# Patient Record
Sex: Male | Born: 1940 | Race: White | Hispanic: No | Smoking: Former smoker
Health system: Southern US, Community
[De-identification: ages and names within clinical notes are randomized; demographics above are authoritative.]

## PROBLEM LIST (undated history)

## (undated) DIAGNOSIS — K760 Fatty (change of) liver, not elsewhere classified: Secondary | ICD-10-CM

## (undated) DIAGNOSIS — K226 Gastro-esophageal laceration-hemorrhage syndrome: Secondary | ICD-10-CM

## (undated) DIAGNOSIS — F329 Major depressive disorder, single episode, unspecified: Secondary | ICD-10-CM

## (undated) DIAGNOSIS — I959 Hypotension, unspecified: Secondary | ICD-10-CM

## (undated) DIAGNOSIS — W19XXXA Unspecified fall, initial encounter: Secondary | ICD-10-CM

## (undated) DIAGNOSIS — M1990 Unspecified osteoarthritis, unspecified site: Secondary | ICD-10-CM

## (undated) DIAGNOSIS — K8020 Calculus of gallbladder without cholecystitis without obstruction: Secondary | ICD-10-CM

## (undated) DIAGNOSIS — Z86010 Personal history of colonic polyps: Secondary | ICD-10-CM

## (undated) DIAGNOSIS — N186 End stage renal disease: Secondary | ICD-10-CM

## (undated) DIAGNOSIS — I495 Sick sinus syndrome: Secondary | ICD-10-CM

## (undated) DIAGNOSIS — Y92009 Unspecified place in unspecified non-institutional (private) residence as the place of occurrence of the external cause: Secondary | ICD-10-CM

## (undated) DIAGNOSIS — Z9289 Personal history of other medical treatment: Secondary | ICD-10-CM

## (undated) DIAGNOSIS — G4733 Obstructive sleep apnea (adult) (pediatric): Secondary | ICD-10-CM

## (undated) DIAGNOSIS — Z889 Allergy status to unspecified drugs, medicaments and biological substances status: Secondary | ICD-10-CM

## (undated) DIAGNOSIS — F1020 Alcohol dependence, uncomplicated: Secondary | ICD-10-CM

## (undated) DIAGNOSIS — I10 Essential (primary) hypertension: Secondary | ICD-10-CM

## (undated) DIAGNOSIS — R55 Syncope and collapse: Secondary | ICD-10-CM

## (undated) DIAGNOSIS — I5032 Chronic diastolic (congestive) heart failure: Secondary | ICD-10-CM

## (undated) DIAGNOSIS — D649 Anemia, unspecified: Secondary | ICD-10-CM

## (undated) DIAGNOSIS — K219 Gastro-esophageal reflux disease without esophagitis: Secondary | ICD-10-CM

## (undated) DIAGNOSIS — Z8719 Personal history of other diseases of the digestive system: Secondary | ICD-10-CM

## (undated) DIAGNOSIS — F419 Anxiety disorder, unspecified: Secondary | ICD-10-CM

## (undated) DIAGNOSIS — G4700 Insomnia, unspecified: Secondary | ICD-10-CM

## (undated) DIAGNOSIS — I462 Cardiac arrest due to underlying cardiac condition: Secondary | ICD-10-CM

## (undated) DIAGNOSIS — R001 Bradycardia, unspecified: Secondary | ICD-10-CM

## (undated) DIAGNOSIS — I499 Cardiac arrhythmia, unspecified: Secondary | ICD-10-CM

## (undated) DIAGNOSIS — E785 Hyperlipidemia, unspecified: Secondary | ICD-10-CM

## (undated) DIAGNOSIS — K2970 Gastritis, unspecified, without bleeding: Secondary | ICD-10-CM

## (undated) DIAGNOSIS — G2581 Restless legs syndrome: Secondary | ICD-10-CM

## (undated) DIAGNOSIS — I4891 Unspecified atrial fibrillation: Secondary | ICD-10-CM

## (undated) DIAGNOSIS — I77810 Thoracic aortic ectasia: Secondary | ICD-10-CM

## (undated) HISTORY — DX: Alcohol dependence, uncomplicated: F10.20

## (undated) HISTORY — DX: Sick sinus syndrome: I49.5

## (undated) HISTORY — DX: Thoracic aortic ectasia: I77.810

## (undated) HISTORY — DX: Insomnia, unspecified: G47.00

## (undated) HISTORY — DX: Gastro-esophageal reflux disease without esophagitis: K21.9

## (undated) HISTORY — DX: Hypotension, unspecified: I95.9

## (undated) HISTORY — PX: JOINT REPLACEMENT: SHX530

## (undated) HISTORY — PX: COLONOSCOPY: SHX174

## (undated) HISTORY — PX: TONSILLECTOMY: SUR1361

## (undated) HISTORY — DX: Syncope and collapse: R55

## (undated) HISTORY — DX: Hyperlipidemia, unspecified: E78.5

## (undated) HISTORY — DX: Major depressive disorder, single episode, unspecified: F32.9

## (undated) HISTORY — DX: Chronic diastolic (congestive) heart failure: I50.32

## (undated) HISTORY — DX: Unspecified osteoarthritis, unspecified site: M19.90

## (undated) HISTORY — PX: VASCULAR SURGERY: SHX849

## (undated) HISTORY — DX: Cardiac arrest due to underlying cardiac condition: I46.2

## (undated) HISTORY — DX: Gastro-esophageal laceration-hemorrhage syndrome: K22.6

## (undated) HISTORY — DX: Personal history of colonic polyps: Z86.010

## (undated) HISTORY — DX: Allergy status to unspecified drugs, medicaments and biological substances: Z88.9

## (undated) HISTORY — DX: Gastritis, unspecified, without bleeding: K29.70

## (undated) HISTORY — DX: Essential (primary) hypertension: I10

## (undated) HISTORY — PX: FRACTURE SURGERY: SHX138

---
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
## 2012-05-09 HISTORY — PX: ANKLE SURGERY: SHX546

## 2014-05-14 DIAGNOSIS — F329 Major depressive disorder, single episode, unspecified: Secondary | ICD-10-CM | POA: Diagnosis not present

## 2014-05-14 DIAGNOSIS — I10 Essential (primary) hypertension: Secondary | ICD-10-CM | POA: Diagnosis not present

## 2014-05-14 DIAGNOSIS — G4700 Insomnia, unspecified: Secondary | ICD-10-CM | POA: Diagnosis not present

## 2014-05-25 DIAGNOSIS — S3690XA Unspecified injury of unspecified intra-abdominal organ, initial encounter: Secondary | ICD-10-CM | POA: Diagnosis not present

## 2014-05-25 DIAGNOSIS — S0990XA Unspecified injury of head, initial encounter: Secondary | ICD-10-CM | POA: Diagnosis not present

## 2014-05-25 DIAGNOSIS — S52502D Unspecified fracture of the lower end of left radius, subsequent encounter for closed fracture with routine healing: Secondary | ICD-10-CM | POA: Diagnosis not present

## 2014-05-26 DIAGNOSIS — S52502D Unspecified fracture of the lower end of left radius, subsequent encounter for closed fracture with routine healing: Secondary | ICD-10-CM | POA: Diagnosis not present

## 2014-05-26 DIAGNOSIS — K760 Fatty (change of) liver, not elsewhere classified: Secondary | ICD-10-CM | POA: Diagnosis not present

## 2014-05-26 DIAGNOSIS — S82842A Displaced bimalleolar fracture of left lower leg, initial encounter for closed fracture: Secondary | ICD-10-CM | POA: Diagnosis not present

## 2014-05-29 DIAGNOSIS — M25559 Pain in unspecified hip: Secondary | ICD-10-CM | POA: Diagnosis not present

## 2014-05-30 DIAGNOSIS — I10 Essential (primary) hypertension: Secondary | ICD-10-CM | POA: Diagnosis not present

## 2014-05-30 DIAGNOSIS — S82841E Displaced bimalleolar fracture of right lower leg, subsequent encounter for open fracture type I or II with routine healing: Secondary | ICD-10-CM | POA: Diagnosis not present

## 2014-05-30 DIAGNOSIS — M25572 Pain in left ankle and joints of left foot: Secondary | ICD-10-CM | POA: Diagnosis not present

## 2014-06-02 DIAGNOSIS — E871 Hypo-osmolality and hyponatremia: Secondary | ICD-10-CM | POA: Diagnosis not present

## 2014-06-04 DIAGNOSIS — Z01818 Encounter for other preprocedural examination: Secondary | ICD-10-CM | POA: Diagnosis not present

## 2014-06-04 DIAGNOSIS — S82841E Displaced bimalleolar fracture of right lower leg, subsequent encounter for open fracture type I or II with routine healing: Secondary | ICD-10-CM | POA: Diagnosis not present

## 2014-06-04 DIAGNOSIS — M25572 Pain in left ankle and joints of left foot: Secondary | ICD-10-CM | POA: Diagnosis not present

## 2014-06-04 DIAGNOSIS — I10 Essential (primary) hypertension: Secondary | ICD-10-CM | POA: Diagnosis not present

## 2014-06-05 DIAGNOSIS — R9431 Abnormal electrocardiogram [ECG] [EKG]: Secondary | ICD-10-CM | POA: Diagnosis not present

## 2014-06-05 DIAGNOSIS — S82892A Other fracture of left lower leg, initial encounter for closed fracture: Secondary | ICD-10-CM | POA: Diagnosis not present

## 2014-06-09 DIAGNOSIS — K219 Gastro-esophageal reflux disease without esophagitis: Secondary | ICD-10-CM | POA: Diagnosis not present

## 2014-06-09 DIAGNOSIS — S93432A Sprain of tibiofibular ligament of left ankle, initial encounter: Secondary | ICD-10-CM | POA: Diagnosis not present

## 2014-06-09 DIAGNOSIS — S82852A Displaced trimalleolar fracture of left lower leg, initial encounter for closed fracture: Secondary | ICD-10-CM | POA: Diagnosis not present

## 2014-06-09 DIAGNOSIS — S8262XA Displaced fracture of lateral malleolus of left fibula, initial encounter for closed fracture: Secondary | ICD-10-CM | POA: Diagnosis not present

## 2014-06-09 DIAGNOSIS — I10 Essential (primary) hypertension: Secondary | ICD-10-CM | POA: Diagnosis not present

## 2014-06-11 DIAGNOSIS — I10 Essential (primary) hypertension: Secondary | ICD-10-CM | POA: Diagnosis not present

## 2014-06-11 DIAGNOSIS — S82892E Other fracture of left lower leg, subsequent encounter for open fracture type I or II with routine healing: Secondary | ICD-10-CM | POA: Diagnosis not present

## 2014-06-13 DIAGNOSIS — M25572 Pain in left ankle and joints of left foot: Secondary | ICD-10-CM | POA: Diagnosis not present

## 2014-06-18 DIAGNOSIS — M25572 Pain in left ankle and joints of left foot: Secondary | ICD-10-CM | POA: Diagnosis not present

## 2014-06-18 DIAGNOSIS — S82842A Displaced bimalleolar fracture of left lower leg, initial encounter for closed fracture: Secondary | ICD-10-CM | POA: Diagnosis not present

## 2014-06-18 DIAGNOSIS — S82842D Displaced bimalleolar fracture of left lower leg, subsequent encounter for closed fracture with routine healing: Secondary | ICD-10-CM | POA: Diagnosis not present

## 2014-06-19 DIAGNOSIS — L03116 Cellulitis of left lower limb: Secondary | ICD-10-CM | POA: Diagnosis not present

## 2014-06-25 DIAGNOSIS — R269 Unspecified abnormalities of gait and mobility: Secondary | ICD-10-CM | POA: Diagnosis not present

## 2014-06-25 DIAGNOSIS — Z5189 Encounter for other specified aftercare: Secondary | ICD-10-CM | POA: Diagnosis not present

## 2014-06-25 DIAGNOSIS — Z4789 Encounter for other orthopedic aftercare: Secondary | ICD-10-CM | POA: Diagnosis not present

## 2014-06-25 DIAGNOSIS — I10 Essential (primary) hypertension: Secondary | ICD-10-CM | POA: Diagnosis not present

## 2014-06-25 DIAGNOSIS — S82842D Displaced bimalleolar fracture of left lower leg, subsequent encounter for closed fracture with routine healing: Secondary | ICD-10-CM | POA: Diagnosis not present

## 2014-06-25 DIAGNOSIS — S82842G Displaced bimalleolar fracture of left lower leg, subsequent encounter for closed fracture with delayed healing: Secondary | ICD-10-CM | POA: Diagnosis not present

## 2014-06-25 DIAGNOSIS — R296 Repeated falls: Secondary | ICD-10-CM | POA: Diagnosis not present

## 2014-06-25 DIAGNOSIS — M6281 Muscle weakness (generalized): Secondary | ICD-10-CM | POA: Diagnosis not present

## 2014-06-25 DIAGNOSIS — R279 Unspecified lack of coordination: Secondary | ICD-10-CM | POA: Diagnosis not present

## 2014-06-25 DIAGNOSIS — M25572 Pain in left ankle and joints of left foot: Secondary | ICD-10-CM | POA: Diagnosis not present

## 2014-06-25 DIAGNOSIS — R7989 Other specified abnormal findings of blood chemistry: Secondary | ICD-10-CM | POA: Diagnosis not present

## 2014-06-25 DIAGNOSIS — S82892S Other fracture of left lower leg, sequela: Secondary | ICD-10-CM | POA: Diagnosis not present

## 2014-06-25 DIAGNOSIS — R2689 Other abnormalities of gait and mobility: Secondary | ICD-10-CM | POA: Diagnosis not present

## 2014-06-25 DIAGNOSIS — N19 Unspecified kidney failure: Secondary | ICD-10-CM | POA: Diagnosis not present

## 2014-06-27 DIAGNOSIS — N19 Unspecified kidney failure: Secondary | ICD-10-CM | POA: Diagnosis not present

## 2014-07-04 DIAGNOSIS — M25572 Pain in left ankle and joints of left foot: Secondary | ICD-10-CM | POA: Diagnosis not present

## 2014-07-04 DIAGNOSIS — Z4789 Encounter for other orthopedic aftercare: Secondary | ICD-10-CM | POA: Diagnosis not present

## 2014-07-04 DIAGNOSIS — S82842D Displaced bimalleolar fracture of left lower leg, subsequent encounter for closed fracture with routine healing: Secondary | ICD-10-CM | POA: Diagnosis not present

## 2014-07-25 DIAGNOSIS — M25572 Pain in left ankle and joints of left foot: Secondary | ICD-10-CM | POA: Diagnosis not present

## 2014-08-08 DIAGNOSIS — M25572 Pain in left ankle and joints of left foot: Secondary | ICD-10-CM | POA: Diagnosis not present

## 2014-08-29 DIAGNOSIS — S82892D Other fracture of left lower leg, subsequent encounter for closed fracture with routine healing: Secondary | ICD-10-CM | POA: Diagnosis not present

## 2014-08-29 DIAGNOSIS — M25572 Pain in left ankle and joints of left foot: Secondary | ICD-10-CM | POA: Diagnosis not present

## 2014-09-04 DIAGNOSIS — K700 Alcoholic fatty liver: Secondary | ICD-10-CM | POA: Diagnosis not present

## 2014-09-04 DIAGNOSIS — I10 Essential (primary) hypertension: Secondary | ICD-10-CM | POA: Diagnosis not present

## 2014-09-04 DIAGNOSIS — G4730 Sleep apnea, unspecified: Secondary | ICD-10-CM | POA: Diagnosis not present

## 2014-09-04 DIAGNOSIS — E785 Hyperlipidemia, unspecified: Secondary | ICD-10-CM | POA: Diagnosis not present

## 2014-09-08 DIAGNOSIS — M25672 Stiffness of left ankle, not elsewhere classified: Secondary | ICD-10-CM | POA: Diagnosis not present

## 2014-09-08 DIAGNOSIS — R269 Unspecified abnormalities of gait and mobility: Secondary | ICD-10-CM | POA: Diagnosis not present

## 2014-09-08 DIAGNOSIS — M62572 Muscle wasting and atrophy, not elsewhere classified, left ankle and foot: Secondary | ICD-10-CM | POA: Diagnosis not present

## 2014-09-08 DIAGNOSIS — M25472 Effusion, left ankle: Secondary | ICD-10-CM | POA: Diagnosis not present

## 2014-09-10 DIAGNOSIS — M25472 Effusion, left ankle: Secondary | ICD-10-CM | POA: Diagnosis not present

## 2014-09-10 DIAGNOSIS — M25672 Stiffness of left ankle, not elsewhere classified: Secondary | ICD-10-CM | POA: Diagnosis not present

## 2014-09-10 DIAGNOSIS — M62572 Muscle wasting and atrophy, not elsewhere classified, left ankle and foot: Secondary | ICD-10-CM | POA: Diagnosis not present

## 2014-09-10 DIAGNOSIS — R269 Unspecified abnormalities of gait and mobility: Secondary | ICD-10-CM | POA: Diagnosis not present

## 2014-09-15 DIAGNOSIS — M62572 Muscle wasting and atrophy, not elsewhere classified, left ankle and foot: Secondary | ICD-10-CM | POA: Diagnosis not present

## 2014-09-15 DIAGNOSIS — R269 Unspecified abnormalities of gait and mobility: Secondary | ICD-10-CM | POA: Diagnosis not present

## 2014-09-15 DIAGNOSIS — M25672 Stiffness of left ankle, not elsewhere classified: Secondary | ICD-10-CM | POA: Diagnosis not present

## 2014-09-15 DIAGNOSIS — M25472 Effusion, left ankle: Secondary | ICD-10-CM | POA: Diagnosis not present

## 2014-10-03 DIAGNOSIS — M25572 Pain in left ankle and joints of left foot: Secondary | ICD-10-CM | POA: Diagnosis not present

## 2014-10-03 DIAGNOSIS — S82892D Other fracture of left lower leg, subsequent encounter for closed fracture with routine healing: Secondary | ICD-10-CM | POA: Diagnosis not present

## 2014-10-13 DIAGNOSIS — H40013 Open angle with borderline findings, low risk, bilateral: Secondary | ICD-10-CM | POA: Diagnosis not present

## 2014-11-04 DIAGNOSIS — R000 Tachycardia, unspecified: Secondary | ICD-10-CM | POA: Diagnosis not present

## 2014-11-04 DIAGNOSIS — N179 Acute kidney failure, unspecified: Secondary | ICD-10-CM | POA: Diagnosis not present

## 2014-11-04 DIAGNOSIS — S0191XA Laceration without foreign body of unspecified part of head, initial encounter: Secondary | ICD-10-CM | POA: Diagnosis not present

## 2014-11-04 DIAGNOSIS — I4891 Unspecified atrial fibrillation: Secondary | ICD-10-CM | POA: Diagnosis not present

## 2014-11-04 DIAGNOSIS — R6889 Other general symptoms and signs: Secondary | ICD-10-CM | POA: Diagnosis not present

## 2014-11-04 DIAGNOSIS — R55 Syncope and collapse: Secondary | ICD-10-CM | POA: Diagnosis not present

## 2014-11-04 DIAGNOSIS — Y929 Unspecified place or not applicable: Secondary | ICD-10-CM | POA: Diagnosis not present

## 2014-11-04 DIAGNOSIS — E871 Hypo-osmolality and hyponatremia: Secondary | ICD-10-CM | POA: Diagnosis not present

## 2014-11-04 DIAGNOSIS — R6520 Severe sepsis without septic shock: Secondary | ICD-10-CM | POA: Diagnosis not present

## 2014-11-04 DIAGNOSIS — R6521 Severe sepsis with septic shock: Secondary | ICD-10-CM | POA: Diagnosis not present

## 2014-11-04 DIAGNOSIS — W19XXXA Unspecified fall, initial encounter: Secondary | ICD-10-CM | POA: Diagnosis not present

## 2014-11-04 DIAGNOSIS — A419 Sepsis, unspecified organism: Secondary | ICD-10-CM | POA: Diagnosis not present

## 2014-11-04 DIAGNOSIS — E876 Hypokalemia: Secondary | ICD-10-CM | POA: Diagnosis not present

## 2014-11-05 DIAGNOSIS — I4891 Unspecified atrial fibrillation: Secondary | ICD-10-CM | POA: Diagnosis not present

## 2014-11-08 DIAGNOSIS — I4891 Unspecified atrial fibrillation: Secondary | ICD-10-CM | POA: Diagnosis not present

## 2014-11-09 DIAGNOSIS — I4891 Unspecified atrial fibrillation: Secondary | ICD-10-CM | POA: Diagnosis not present

## 2014-11-10 DIAGNOSIS — I4891 Unspecified atrial fibrillation: Secondary | ICD-10-CM | POA: Diagnosis not present

## 2014-11-11 DIAGNOSIS — I4891 Unspecified atrial fibrillation: Secondary | ICD-10-CM | POA: Diagnosis not present

## 2014-11-14 DIAGNOSIS — I951 Orthostatic hypotension: Secondary | ICD-10-CM | POA: Diagnosis not present

## 2014-11-14 DIAGNOSIS — I4891 Unspecified atrial fibrillation: Secondary | ICD-10-CM | POA: Diagnosis not present

## 2014-11-28 DIAGNOSIS — I10 Essential (primary) hypertension: Secondary | ICD-10-CM | POA: Diagnosis not present

## 2014-11-28 DIAGNOSIS — D649 Anemia, unspecified: Secondary | ICD-10-CM | POA: Diagnosis not present

## 2014-11-28 DIAGNOSIS — I4891 Unspecified atrial fibrillation: Secondary | ICD-10-CM | POA: Diagnosis not present

## 2014-12-05 DIAGNOSIS — Z125 Encounter for screening for malignant neoplasm of prostate: Secondary | ICD-10-CM | POA: Diagnosis not present

## 2014-12-05 DIAGNOSIS — N401 Enlarged prostate with lower urinary tract symptoms: Secondary | ICD-10-CM | POA: Diagnosis not present

## 2014-12-05 DIAGNOSIS — I10 Essential (primary) hypertension: Secondary | ICD-10-CM | POA: Diagnosis not present

## 2014-12-05 DIAGNOSIS — K700 Alcoholic fatty liver: Secondary | ICD-10-CM | POA: Diagnosis not present

## 2014-12-05 DIAGNOSIS — E785 Hyperlipidemia, unspecified: Secondary | ICD-10-CM | POA: Diagnosis not present

## 2014-12-05 DIAGNOSIS — G4730 Sleep apnea, unspecified: Secondary | ICD-10-CM | POA: Diagnosis not present

## 2014-12-05 DIAGNOSIS — Z1212 Encounter for screening for malignant neoplasm of rectum: Secondary | ICD-10-CM | POA: Diagnosis not present

## 2014-12-05 DIAGNOSIS — Z0000 Encounter for general adult medical examination without abnormal findings: Secondary | ICD-10-CM | POA: Diagnosis not present

## 2014-12-05 DIAGNOSIS — I480 Paroxysmal atrial fibrillation: Secondary | ICD-10-CM | POA: Diagnosis not present

## 2014-12-05 DIAGNOSIS — E559 Vitamin D deficiency, unspecified: Secondary | ICD-10-CM | POA: Diagnosis not present

## 2014-12-11 DIAGNOSIS — I10 Essential (primary) hypertension: Secondary | ICD-10-CM | POA: Diagnosis not present

## 2015-01-23 DIAGNOSIS — M25572 Pain in left ankle and joints of left foot: Secondary | ICD-10-CM | POA: Diagnosis not present

## 2015-01-23 DIAGNOSIS — S82842D Displaced bimalleolar fracture of left lower leg, subsequent encounter for closed fracture with routine healing: Secondary | ICD-10-CM | POA: Diagnosis not present

## 2015-03-06 DIAGNOSIS — I480 Paroxysmal atrial fibrillation: Secondary | ICD-10-CM | POA: Diagnosis not present

## 2015-03-12 DIAGNOSIS — Z23 Encounter for immunization: Secondary | ICD-10-CM | POA: Diagnosis not present

## 2015-03-12 DIAGNOSIS — D225 Melanocytic nevi of trunk: Secondary | ICD-10-CM | POA: Diagnosis not present

## 2015-05-29 DIAGNOSIS — S82892D Other fracture of left lower leg, subsequent encounter for closed fracture with routine healing: Secondary | ICD-10-CM | POA: Diagnosis not present

## 2015-05-29 DIAGNOSIS — M25572 Pain in left ankle and joints of left foot: Secondary | ICD-10-CM | POA: Diagnosis not present

## 2015-06-05 DIAGNOSIS — E785 Hyperlipidemia, unspecified: Secondary | ICD-10-CM | POA: Diagnosis not present

## 2015-06-05 DIAGNOSIS — R0609 Other forms of dyspnea: Secondary | ICD-10-CM | POA: Diagnosis not present

## 2015-06-05 DIAGNOSIS — K700 Alcoholic fatty liver: Secondary | ICD-10-CM | POA: Diagnosis not present

## 2015-06-05 DIAGNOSIS — I480 Paroxysmal atrial fibrillation: Secondary | ICD-10-CM | POA: Diagnosis not present

## 2015-06-05 DIAGNOSIS — I10 Essential (primary) hypertension: Secondary | ICD-10-CM | POA: Diagnosis not present

## 2015-06-12 DIAGNOSIS — I480 Paroxysmal atrial fibrillation: Secondary | ICD-10-CM | POA: Diagnosis not present

## 2015-07-13 DIAGNOSIS — H40013 Open angle with borderline findings, low risk, bilateral: Secondary | ICD-10-CM | POA: Diagnosis not present

## 2015-07-13 DIAGNOSIS — H2513 Age-related nuclear cataract, bilateral: Secondary | ICD-10-CM | POA: Diagnosis not present

## 2015-08-21 DIAGNOSIS — I2510 Atherosclerotic heart disease of native coronary artery without angina pectoris: Secondary | ICD-10-CM | POA: Diagnosis not present

## 2015-08-21 DIAGNOSIS — I4891 Unspecified atrial fibrillation: Secondary | ICD-10-CM | POA: Diagnosis not present

## 2015-08-28 DIAGNOSIS — I10 Essential (primary) hypertension: Secondary | ICD-10-CM | POA: Diagnosis not present

## 2015-08-28 DIAGNOSIS — E785 Hyperlipidemia, unspecified: Secondary | ICD-10-CM | POA: Diagnosis not present

## 2015-08-28 DIAGNOSIS — K700 Alcoholic fatty liver: Secondary | ICD-10-CM | POA: Diagnosis not present

## 2015-08-28 DIAGNOSIS — R739 Hyperglycemia, unspecified: Secondary | ICD-10-CM | POA: Diagnosis not present

## 2015-08-28 DIAGNOSIS — I480 Paroxysmal atrial fibrillation: Secondary | ICD-10-CM | POA: Diagnosis not present

## 2015-12-17 DIAGNOSIS — Z7901 Long term (current) use of anticoagulants: Secondary | ICD-10-CM | POA: Insufficient documentation

## 2015-12-17 DIAGNOSIS — J302 Other seasonal allergic rhinitis: Secondary | ICD-10-CM | POA: Insufficient documentation

## 2015-12-17 DIAGNOSIS — E7800 Pure hypercholesterolemia, unspecified: Secondary | ICD-10-CM | POA: Insufficient documentation

## 2015-12-17 DIAGNOSIS — F321 Major depressive disorder, single episode, moderate: Secondary | ICD-10-CM | POA: Insufficient documentation

## 2015-12-17 DIAGNOSIS — I480 Paroxysmal atrial fibrillation: Secondary | ICD-10-CM | POA: Diagnosis present

## 2015-12-17 DIAGNOSIS — I10 Essential (primary) hypertension: Secondary | ICD-10-CM | POA: Insufficient documentation

## 2015-12-17 DIAGNOSIS — K219 Gastro-esophageal reflux disease without esophagitis: Secondary | ICD-10-CM

## 2015-12-25 ENCOUNTER — Encounter: Payer: Self-pay | Admitting: Cardiovascular Disease

## 2015-12-28 ENCOUNTER — Ambulatory Visit (INDEPENDENT_AMBULATORY_CARE_PROVIDER_SITE_OTHER): Payer: Medicare Other | Admitting: Cardiovascular Disease

## 2015-12-28 ENCOUNTER — Encounter (INDEPENDENT_AMBULATORY_CARE_PROVIDER_SITE_OTHER): Payer: Self-pay

## 2015-12-28 ENCOUNTER — Encounter: Payer: Self-pay | Admitting: Cardiovascular Disease

## 2015-12-28 VITALS — BP 138/80 | HR 66 | Ht 72.0 in | Wt 246.0 lb

## 2015-12-28 DIAGNOSIS — I480 Paroxysmal atrial fibrillation: Secondary | ICD-10-CM

## 2015-12-28 NOTE — Progress Notes (Signed)
Cardiology Office Note Date:  12/31/2015   ID:  Darryl Diaz, DOB January 19, 1941, MRN [id number]  PCP:  [name], MD  Cardiologist:  Sherren Mocha, MD    Chief Complaint  Patient presents with  . Atrial Fibrillation    History of Present Illness: Darryl Diaz is a 75 y.o. male who presents for initial evaluation of atrial fibrillation.   The patient was diagnosed with paroxysmal atrial fibrillation several years ago. He has been followed at [facility] by Dr Ronnette Juniper and has been on Eliquis for approximately one year. Denies bleeding problems. When he was in atrial fibrillation, he does not recall having any symptoms. Today, he denies symptoms of palpitations, chest pain, shortness of breath, orthopnea, PND, lower extremity edema, dizziness, or syncope.  He was in [employer] business for many years, now retired and relocated to [place]. He has a history of alcoholism but is in remission. He has no other complaints.   Past Medical History:  Diagnosis Date  . Alcoholism ([place])    in remission following wife's death  . GERD (gastroesophageal reflux disease)   . H/O seasonal allergies   . Hyperlipidemia   . Hypertension   . Insomnia   . Major depressive disorder ([place])    following wife's death  . OA (osteoarthritis)    hands    Past Surgical History:  Procedure Laterality Date  . ANKLE SURGERY Left 2014   had rods put in     Current Outpatient Prescriptions  Medication Sig Dispense Refill  . apixaban (ELIQUIS) 5 MG TABS tablet Take 5 mg by mouth 2 (two) times daily.    Marland Kitchen atorvastatin (LIPITOR) 20 MG tablet Take 20 mg by mouth daily.    Marland Kitchen b complex vitamins capsule Take 1 capsule by mouth daily.    . cetirizine (ZYRTEC) 10 MG tablet Take 10 mg by mouth daily.    Marland Kitchen escitalopram (LEXAPRO) 10 MG tablet Take 10 mg by mouth daily.    . ferrous sulfate 325 (65 FE) MG EC tablet Take 325 mg by mouth daily with breakfast.    . folic acid (FOLVITE) 1 MG tablet Take 1  mg by mouth daily.    . metoprolol (TOPROL-XL) 200 MG 24 hr tablet Take 200 mg by mouth daily.    . Multiple Vitamin (MULTIVITAMIN) capsule Take 1 capsule by mouth daily.    . pantoprazole (PROTONIX) 40 MG tablet Take 40 mg by mouth daily.    . traZODone (DESYREL) 50 MG tablet Take 50 mg by mouth at bedtime.     No current facility-administered medications for this visit.     Allergies:   Review of patient's allergies indicates no known allergies.   Social History:  The patient  reports that he has quit smoking. He has never used smokeless tobacco. He reports that he drinks alcohol. He reports that he does not use drugs.   Family History:  The patient's family history includes Arthritis in his mother; Heart disease (age of onset: 30) in his mother; Heart failure in his sister; Heart failure (age of onset: 51) in his father; Hypertension in his mother; Stroke in his maternal aunt.   ROS:  Please see the history of present illness.  All other systems are reviewed and negative.    PHYSICAL EXAM: VS:  BP 138/80   Pulse 66   Ht 6' (1.829 m)   Wt 111.6 kg (246 lb)   BMI 33.36 kg/m  , BMI Body mass index is  33.36 kg/m. GEN: Well nourished, well developed, in no acute distress  HEENT: normal  Neck: no JVD, no masses. No carotid bruits Cardiac: RRR without murmur or gallop                Respiratory:  clear to auscultation bilaterally, normal work of breathing GI: soft, nontender, nondistended, + BS MS: no deformity or atrophy  Ext: no pretibial edema, pedal pulses 2+= bilaterally Skin: warm and dry, no rash Neuro:  Strength and sensation are intact Psych: euthymic mood, full affect  EKG:  EKG is ordered today. The ekg ordered today shows NSR, within normal limits  Recent Labs: No results found for requested labs within last 8760 hours.   Lipid Panel  No results found for: CHOL, TRIG, HDL, CHOLHDL, VLDL, LDLCALC, LDLDIRECT    Wt Readings from Last 3 Encounters:  12/28/15 111.6  kg (246 lb)     ASSESSMENT AND PLAN: Paroxysmal atrial fibrillation: not a lot of detail available from the patient's history. Will send for records from [place]. Appears he had incidentally discovered atrial fibrillation not associated with specific symptoms. He does not recall a history of cardioversion. He has been on Eliquis and seems to be tolerating it well. CHADS-Vasc = 2 for age 74-75 and HTN, so appropriately anticoagulated with Eliquis. Will send for echo report, office records and review when available. Do not anticipate any change in medications at this time.   Current medicines are reviewed with the patient today.  The patient does not have concerns regarding medicines.  Labs/ tests ordered today include:   Orders Placed This Encounter  Procedures  . EKG 12-Lead    Disposition:   FU one year  Signed, Sherren Mocha, MD  12/31/2015 5:28 PM    [address], [place], [place]  [address] Phone: [phone]; Fax: [phone]

## 2015-12-28 NOTE — Patient Instructions (Signed)
Medication Instructions:  Your physician has recommended you make the following change in your medication:  1. STOP Aspirin  Labwork: No new orders.   Testing/Procedures: No new orders.   Follow-Up: Your physician wants you to follow-up in: 1 YEAR with Dr Burt Knack.  You will receive a reminder letter in the mail two months in advance. If you don't receive a letter, please call our office to schedule the follow-up appointment.   Any Other Special Instructions Will Be Listed Below (If Applicable).     If you need a refill on your cardiac medications before your next appointment, please call your pharmacy.

## 2016-02-05 DIAGNOSIS — Z23 Encounter for immunization: Secondary | ICD-10-CM | POA: Diagnosis not present

## 2016-03-28 DIAGNOSIS — W19XXXA Unspecified fall, initial encounter: Secondary | ICD-10-CM

## 2016-03-28 HISTORY — DX: Unspecified place in unspecified non-institutional (private) residence as the place of occurrence of the external cause: W19.XXXA

## 2016-03-30 ENCOUNTER — Encounter (HOSPITAL_COMMUNITY): Payer: Self-pay

## 2016-03-30 ENCOUNTER — Emergency Department (HOSPITAL_COMMUNITY): Payer: Medicare Other

## 2016-03-30 ENCOUNTER — Inpatient Hospital Stay (HOSPITAL_COMMUNITY)
Admission: EM | Admit: 2016-03-30 | Discharge: 2016-04-04 | DRG: 470 | Disposition: A | Discharge status: Skilled Nursing Facility | Payer: Medicare Other | Attending: Internal Medicine | Admitting: Internal Medicine

## 2016-03-30 DIAGNOSIS — W1830XA Fall on same level, unspecified, initial encounter: Secondary | ICD-10-CM | POA: Diagnosis present

## 2016-03-30 DIAGNOSIS — M6282 Rhabdomyolysis: Secondary | ICD-10-CM | POA: Diagnosis present

## 2016-03-30 DIAGNOSIS — Z79899 Other long term (current) drug therapy: Secondary | ICD-10-CM | POA: Diagnosis not present

## 2016-03-30 DIAGNOSIS — T796XXA Traumatic ischemia of muscle, initial encounter: Secondary | ICD-10-CM

## 2016-03-30 DIAGNOSIS — M1990 Unspecified osteoarthritis, unspecified site: Secondary | ICD-10-CM | POA: Diagnosis not present

## 2016-03-30 DIAGNOSIS — S8992XA Unspecified injury of left lower leg, initial encounter: Secondary | ICD-10-CM | POA: Diagnosis not present

## 2016-03-30 DIAGNOSIS — S59901A Unspecified injury of right elbow, initial encounter: Secondary | ICD-10-CM | POA: Diagnosis not present

## 2016-03-30 DIAGNOSIS — S72009A Fracture of unspecified part of neck of unspecified femur, initial encounter for closed fracture: Secondary | ICD-10-CM

## 2016-03-30 DIAGNOSIS — E784 Other hyperlipidemia: Secondary | ICD-10-CM | POA: Diagnosis not present

## 2016-03-30 DIAGNOSIS — F32A Depression, unspecified: Secondary | ICD-10-CM

## 2016-03-30 DIAGNOSIS — Z8261 Family history of arthritis: Secondary | ICD-10-CM

## 2016-03-30 DIAGNOSIS — Z8249 Family history of ischemic heart disease and other diseases of the circulatory system: Secondary | ICD-10-CM | POA: Diagnosis not present

## 2016-03-30 DIAGNOSIS — Z823 Family history of stroke: Secondary | ICD-10-CM

## 2016-03-30 DIAGNOSIS — E876 Hypokalemia: Secondary | ICD-10-CM | POA: Diagnosis not present

## 2016-03-30 DIAGNOSIS — T148XXA Other injury of unspecified body region, initial encounter: Secondary | ICD-10-CM | POA: Diagnosis not present

## 2016-03-30 DIAGNOSIS — T796XXD Traumatic ischemia of muscle, subsequent encounter: Secondary | ICD-10-CM | POA: Diagnosis not present

## 2016-03-30 DIAGNOSIS — I4891 Unspecified atrial fibrillation: Secondary | ICD-10-CM | POA: Diagnosis not present

## 2016-03-30 DIAGNOSIS — Z7901 Long term (current) use of anticoagulants: Secondary | ICD-10-CM | POA: Diagnosis not present

## 2016-03-30 DIAGNOSIS — M19042 Primary osteoarthritis, left hand: Secondary | ICD-10-CM | POA: Diagnosis present

## 2016-03-30 DIAGNOSIS — E785 Hyperlipidemia, unspecified: Secondary | ICD-10-CM

## 2016-03-30 DIAGNOSIS — M79605 Pain in left leg: Secondary | ICD-10-CM | POA: Diagnosis not present

## 2016-03-30 DIAGNOSIS — E669 Obesity, unspecified: Secondary | ICD-10-CM | POA: Diagnosis present

## 2016-03-30 DIAGNOSIS — W010XXA Fall on same level from slipping, tripping and stumbling without subsequent striking against object, initial encounter: Secondary | ICD-10-CM | POA: Diagnosis not present

## 2016-03-30 DIAGNOSIS — E860 Dehydration: Secondary | ICD-10-CM | POA: Diagnosis present

## 2016-03-30 DIAGNOSIS — K219 Gastro-esophageal reflux disease without esophagitis: Secondary | ICD-10-CM | POA: Diagnosis not present

## 2016-03-30 DIAGNOSIS — I480 Paroxysmal atrial fibrillation: Secondary | ICD-10-CM | POA: Diagnosis present

## 2016-03-30 DIAGNOSIS — I10 Essential (primary) hypertension: Secondary | ICD-10-CM

## 2016-03-30 DIAGNOSIS — Z6831 Body mass index (BMI) 31.0-31.9, adult: Secondary | ICD-10-CM | POA: Diagnosis not present

## 2016-03-30 DIAGNOSIS — S72002A Fracture of unspecified part of neck of left femur, initial encounter for closed fracture: Secondary | ICD-10-CM | POA: Diagnosis not present

## 2016-03-30 DIAGNOSIS — W19XXXA Unspecified fall, initial encounter: Secondary | ICD-10-CM | POA: Diagnosis not present

## 2016-03-30 DIAGNOSIS — N179 Acute kidney failure, unspecified: Secondary | ICD-10-CM | POA: Diagnosis present

## 2016-03-30 DIAGNOSIS — Z471 Aftercare following joint replacement surgery: Secondary | ICD-10-CM | POA: Diagnosis not present

## 2016-03-30 DIAGNOSIS — D72829 Elevated white blood cell count, unspecified: Secondary | ICD-10-CM | POA: Diagnosis not present

## 2016-03-30 DIAGNOSIS — D62 Acute posthemorrhagic anemia: Secondary | ICD-10-CM | POA: Diagnosis not present

## 2016-03-30 DIAGNOSIS — S72042A Displaced fracture of base of neck of left femur, initial encounter for closed fracture: Secondary | ICD-10-CM | POA: Diagnosis not present

## 2016-03-30 DIAGNOSIS — D696 Thrombocytopenia, unspecified: Secondary | ICD-10-CM | POA: Diagnosis present

## 2016-03-30 DIAGNOSIS — G4700 Insomnia, unspecified: Secondary | ICD-10-CM | POA: Diagnosis present

## 2016-03-30 DIAGNOSIS — M79641 Pain in right hand: Secondary | ICD-10-CM | POA: Diagnosis not present

## 2016-03-30 DIAGNOSIS — S299XXA Unspecified injury of thorax, initial encounter: Secondary | ICD-10-CM | POA: Diagnosis not present

## 2016-03-30 DIAGNOSIS — Z87891 Personal history of nicotine dependence: Secondary | ICD-10-CM

## 2016-03-30 DIAGNOSIS — Z9181 History of falling: Secondary | ICD-10-CM | POA: Diagnosis not present

## 2016-03-30 DIAGNOSIS — M19041 Primary osteoarthritis, right hand: Secondary | ICD-10-CM | POA: Diagnosis present

## 2016-03-30 DIAGNOSIS — R531 Weakness: Secondary | ICD-10-CM | POA: Diagnosis not present

## 2016-03-30 DIAGNOSIS — F329 Major depressive disorder, single episode, unspecified: Secondary | ICD-10-CM

## 2016-03-30 DIAGNOSIS — Y92009 Unspecified place in unspecified non-institutional (private) residence as the place of occurrence of the external cause: Secondary | ICD-10-CM

## 2016-03-30 DIAGNOSIS — Z96642 Presence of left artificial hip joint: Secondary | ICD-10-CM | POA: Diagnosis not present

## 2016-03-30 DIAGNOSIS — S72002D Fracture of unspecified part of neck of left femur, subsequent encounter for closed fracture with routine healing: Secondary | ICD-10-CM | POA: Diagnosis not present

## 2016-03-30 DIAGNOSIS — Z880 Allergy status to penicillin: Secondary | ICD-10-CM | POA: Diagnosis not present

## 2016-03-30 DIAGNOSIS — L8990 Pressure ulcer of unspecified site, unspecified stage: Secondary | ICD-10-CM | POA: Insufficient documentation

## 2016-03-30 DIAGNOSIS — R262 Difficulty in walking, not elsewhere classified: Secondary | ICD-10-CM

## 2016-03-30 DIAGNOSIS — M25552 Pain in left hip: Secondary | ICD-10-CM | POA: Diagnosis not present

## 2016-03-30 HISTORY — DX: Unspecified atrial fibrillation: I48.91

## 2016-03-30 HISTORY — DX: Unspecified fall, initial encounter: W19.XXXA

## 2016-03-30 HISTORY — DX: Unspecified place in unspecified non-institutional (private) residence as the place of occurrence of the external cause: Y92.009

## 2016-03-30 HISTORY — DX: Fracture of unspecified part of neck of unspecified femur, initial encounter for closed fracture: S72.009A

## 2016-03-30 LAB — URINALYSIS, ROUTINE W REFLEX MICROSCOPIC
Glucose, UA: NEGATIVE mg/dL
Ketones, ur: 40 mg/dL — AB
Leukocytes, UA: NEGATIVE
Nitrite: NEGATIVE
Protein, ur: 100 mg/dL — AB
Specific Gravity, Urine: 1.029 (ref 1.005–1.030)
pH: 5.5 (ref 5.0–8.0)

## 2016-03-30 LAB — URINE MICROSCOPIC-ADD ON

## 2016-03-30 LAB — I-STAT TROPONIN, ED: Troponin i, poc: 0.04 ng/mL (ref 0.00–0.08)

## 2016-03-30 LAB — CBC WITH DIFFERENTIAL/PLATELET
Basophils Absolute: 0 10*3/uL (ref 0.0–0.1)
Basophils Relative: 0 %
Eosinophils Absolute: 0 10*3/uL (ref 0.0–0.7)
Eosinophils Relative: 0 %
HCT: 41.8 % (ref 39.0–52.0)
Hemoglobin: 14.6 g/dL (ref 13.0–17.0)
Lymphocytes Relative: 6 %
Lymphs Abs: 1.1 10*3/uL (ref 0.7–4.0)
MCH: 32.6 pg (ref 26.0–34.0)
MCHC: 34.9 g/dL (ref 30.0–36.0)
MCV: 93.3 fL (ref 78.0–100.0)
Monocytes Absolute: 1.6 10*3/uL — ABNORMAL HIGH (ref 0.1–1.0)
Monocytes Relative: 8 %
Neutro Abs: 16.9 10*3/uL — ABNORMAL HIGH (ref 1.7–7.7)
Neutrophils Relative %: 86 %
Platelets: 161 10*3/uL (ref 150–400)
RBC: 4.48 MIL/uL (ref 4.22–5.81)
RDW: 14.7 % (ref 11.5–15.5)
WBC: 19.6 10*3/uL — ABNORMAL HIGH (ref 4.0–10.5)

## 2016-03-30 LAB — COMPREHENSIVE METABOLIC PANEL
ALT: 54 U/L (ref 17–63)
AST: 85 U/L — ABNORMAL HIGH (ref 15–41)
Albumin: 3.8 g/dL (ref 3.5–5.0)
Alkaline Phosphatase: 119 U/L (ref 38–126)
Anion gap: 15 (ref 5–15)
BUN: 14 mg/dL (ref 6–20)
CO2: 22 mmol/L (ref 22–32)
Calcium: 9.1 mg/dL (ref 8.9–10.3)
Chloride: 102 mmol/L (ref 101–111)
Creatinine, Ser: 1.51 mg/dL — ABNORMAL HIGH (ref 0.61–1.24)
GFR calc Af Amer: 51 mL/min — ABNORMAL LOW (ref >60)
GFR calc non Af Amer: 44 mL/min — ABNORMAL LOW (ref >60)
Glucose, Bld: 162 mg/dL — ABNORMAL HIGH (ref 65–99)
Potassium: 3.7 mmol/L (ref 3.5–5.1)
Sodium: 139 mmol/L (ref 135–145)
Total Bilirubin: 2.1 mg/dL — ABNORMAL HIGH (ref 0.3–1.2)
Total Protein: 6.7 g/dL (ref 6.5–8.1)

## 2016-03-30 LAB — TYPE AND SCREEN
ABO/RH(D): O POS
Antibody Screen: NEGATIVE

## 2016-03-30 LAB — ABO/RH: ABO/RH(D): O POS

## 2016-03-30 LAB — PROTIME-INR
INR: 1.09
Prothrombin Time: 14.1 seconds (ref 11.4–15.2)

## 2016-03-30 LAB — I-STAT CG4 LACTIC ACID, ED
Lactic Acid, Venous: 2.73 mmol/L (ref 0.5–1.9)
Lactic Acid, Venous: 2.39 mmol/L (ref 0.5–1.9)

## 2016-03-30 LAB — TROPONIN I
Troponin I: 0.08 ng/mL (ref <0.03)
Troponin I: 0.05 ng/mL (ref <0.03)

## 2016-03-30 LAB — CK: Total CK: 1192 U/L — ABNORMAL HIGH (ref 49–397)

## 2016-03-30 MED ORDER — ACETAMINOPHEN 325 MG PO TABS: 650.0000 mg | ORAL_TABLET | Freq: Four times a day (QID) | ORAL | Status: DC | PRN

## 2016-03-30 MED ORDER — FENTANYL CITRATE (PF) 100 MCG/2ML IJ SOLN
50.0000 ug | INTRAMUSCULAR | Status: DC | PRN
Start: 2016-03-30 — End: 2016-03-30

## 2016-03-30 MED ORDER — TRAZODONE HCL 50 MG PO TABS
50.0000 mg | ORAL_TABLET | Freq: Every day | ORAL | Status: DC
  Administered 2016-03-30 – 2016-04-03 (×5): 50 mg via ORAL
  Filled 2016-03-30 (×5): qty 1

## 2016-03-30 MED ORDER — PANTOPRAZOLE SODIUM 40 MG PO TBEC
40.0000 mg | DELAYED_RELEASE_TABLET | Freq: Every day | ORAL | Status: DC
Start: 2016-03-30 — End: 2016-04-04
  Administered 2016-03-30 – 2016-04-04 (×5): 40 mg via ORAL
  Filled 2016-03-30 (×5): qty 1

## 2016-03-30 MED ORDER — SODIUM CHLORIDE 0.9 % IV SOLN
Freq: Once | INTRAVENOUS | Status: AC
  Administered 2016-03-30: 12:00:00 via INTRAVENOUS

## 2016-03-30 MED ORDER — FERROUS SULFATE 325 (65 FE) MG PO TBEC: 325.0000 mg | DELAYED_RELEASE_TABLET | Freq: Every day | ORAL | Status: DC

## 2016-03-30 MED ORDER — FERROUS SULFATE 325 (65 FE) MG PO TABS
325.0000 mg | ORAL_TABLET | Freq: Every day | ORAL | Status: DC
  Administered 2016-04-02 – 2016-04-03 (×2): 325 mg via ORAL
  Filled 2016-03-30 (×2): qty 1

## 2016-03-30 MED ORDER — ACETAMINOPHEN 650 MG RE SUPP: 650.0000 mg | Freq: Four times a day (QID) | RECTAL | Status: DC | PRN

## 2016-03-30 MED ORDER — SODIUM CHLORIDE 0.9 % IV SOLN
INTRAVENOUS | Status: DC
  Administered 2016-03-30 – 2016-03-31 (×2): via INTRAVENOUS

## 2016-03-30 MED ORDER — MAGNESIUM CITRATE PO SOLN: 1.0000 | Freq: Once | ORAL | Status: DC | PRN

## 2016-03-30 MED ORDER — SODIUM CHLORIDE 0.9 % IV SOLN
INTRAVENOUS | Status: AC
  Administered 2016-03-30: 13:00:00 via INTRAVENOUS

## 2016-03-30 MED ORDER — HYDROCODONE-ACETAMINOPHEN 5-325 MG PO TABS
1.0000 | ORAL_TABLET | ORAL | Status: DC | PRN
  Administered 2016-03-30 – 2016-04-02 (×2): 2 via ORAL
  Filled 2016-03-30 (×2): qty 2

## 2016-03-30 MED ORDER — SODIUM CHLORIDE 0.9% FLUSH
3.0000 mL | Freq: Two times a day (BID) | INTRAVENOUS | Status: DC
  Administered 2016-03-30 – 2016-04-04 (×9): 3 mL via INTRAVENOUS

## 2016-03-30 MED ORDER — HYDROCODONE-ACETAMINOPHEN 5-325 MG PO TABS: 1.0000 | ORAL_TABLET | Freq: Four times a day (QID) | ORAL | Status: DC | PRN

## 2016-03-30 MED ORDER — BISACODYL 10 MG RE SUPP: 10.0000 mg | Freq: Every day | RECTAL | Status: DC | PRN

## 2016-03-30 MED ORDER — ONDANSETRON HCL 4 MG/2ML IJ SOLN: 4.0000 mg | Freq: Four times a day (QID) | INTRAMUSCULAR | Status: DC | PRN

## 2016-03-30 MED ORDER — METOPROLOL SUCCINATE ER 100 MG PO TB24
200.0000 mg | ORAL_TABLET | Freq: Every day | ORAL | Status: DC
  Administered 2016-03-30 – 2016-03-31 (×2): 200 mg via ORAL
  Filled 2016-03-30 (×2): qty 2

## 2016-03-30 MED ORDER — SENNOSIDES-DOCUSATE SODIUM 8.6-50 MG PO TABS: 1.0000 | ORAL_TABLET | Freq: Every evening | ORAL | Status: DC | PRN

## 2016-03-30 MED ORDER — ESCITALOPRAM OXALATE 10 MG PO TABS
10.0000 mg | ORAL_TABLET | Freq: Every day | ORAL | Status: DC
  Administered 2016-03-30 – 2016-04-04 (×5): 10 mg via ORAL
  Filled 2016-03-30 (×5): qty 1

## 2016-03-30 MED ORDER — HYDROMORPHONE HCL 2 MG/ML IJ SOLN
0.5000 mg | INTRAMUSCULAR | Status: DC | PRN
  Administered 2016-03-30: 0.5 mg via INTRAVENOUS

## 2016-03-30 MED ORDER — SODIUM CHLORIDE 0.9 % IV BOLUS (SEPSIS)
1000.0000 mL | Freq: Once | INTRAVENOUS | Status: AC
  Administered 2016-03-30: 1000 mL via INTRAVENOUS

## 2016-03-30 MED ORDER — FENTANYL CITRATE (PF) 100 MCG/2ML IJ SOLN
50.0000 ug | Freq: Once | INTRAMUSCULAR | Status: AC
  Administered 2016-03-30: 50 ug via INTRAVENOUS
  Filled 2016-03-30: qty 2

## 2016-03-30 MED ORDER — HYDROMORPHONE HCL 2 MG/ML IJ SOLN
0.5000 mg | INTRAMUSCULAR | Status: DC | PRN
  Filled 2016-03-30: qty 1

## 2016-03-30 MED ORDER — ONDANSETRON HCL 4 MG/2ML IJ SOLN
4.0000 mg | Freq: Three times a day (TID) | INTRAMUSCULAR | Status: DC | PRN
  Administered 2016-03-30: 4 mg via INTRAVENOUS
  Filled 2016-03-30: qty 2

## 2016-03-30 MED ORDER — ATORVASTATIN CALCIUM 20 MG PO TABS
20.0000 mg | ORAL_TABLET | Freq: Every day | ORAL | Status: DC
  Administered 2016-03-30 – 2016-03-31 (×2): 20 mg via ORAL
  Filled 2016-03-30 (×2): qty 1

## 2016-03-30 NOTE — Progress Notes (Signed)
PT Cancellation Note  Patient Details Name: Creedence Kunesh MRN: [id number] DOB: 11/12/1940   Cancelled Treatment:    Reason Eval/Treat Not Completed: Medical issues which prohibited therapy.  Pt with (+) Left femoral neck fx.  Awaiting ortho recs.  PT will follow along acutely.   Thanks,    Barbarann Ehlers. Zaiyah Sottile, PT, DPT [phone]   03/30/2016, 4:28 PM

## 2016-03-30 NOTE — ED Triage Notes (Signed)
Patient had diarrhea on Monday and was trying to get up from bed to go the restroom when he fell on his left side. He remained on the floor until this AM when a family member found him. Pain in from L knee up, and pressure sores on lower extremities.  Hy. Of A-fib and no medications since Monday. Received 120mcg Fentanyl with EMS.

## 2016-03-30 NOTE — Progress Notes (Signed)
Stopped by to visit with pt, but not a good time with nurses' activities with him. Pt invited me to return later. Will do.   03/30/16 1400  Clinical Encounter Type  Visited With Patient  Visit Type Initial  Referral From Chaplain  Spiritual Encounters  Spiritual Needs Prayer  Stress Factors  Patient Stress Factors Health changes  ,me

## 2016-03-30 NOTE — Progress Notes (Addendum)
CRITICAL VALUE ALERT  Critical value received:  Troponin 0.08  Date of notification:  03/30/2016  Time of notification:  1003  Critical value read back:Yes.    Nurse who received alert:  Dorcas Carrow, RN  MD notified (1st page):  S. Shawn Route, PA  Time of first page:  1527  MD notified (2nd page): S. Shawn Route, PA  Time of second page: 1550  Responding MD:  No response, paged [facility] admissions      Time MD responded: no response  Pt does not have attending on file, paged S. Shawn Route, PA whom was the last provider to document on patient. Received no response. Paged [facility] Admissions twice to ask about patient's MD, no response. Pt stable at this time, will continue to monitor. 1800-D. Merrell, MD made aware, no new orders at this time, will continue to monitor.

## 2016-03-30 NOTE — Consult Note (Addendum)
[facility] Nurse wound consult note Reason for Consult: Consult requested to assess several locations.  Pt was found down at home for unknown period of time.   Wound type: Left ankle with deep tissue injury; 1.5X.15cm, dark reddish purple Right knee abrasion; 2X2cm, dark red abd swollen, no open wound Left knee 3X3cm, dark red abd swollen, no open wound Right outer hip abrasion; 2X.2cm; linear and atypical for a pressure injury in appearance; pt states he has a history of shingles, and this could be a possible etiology.  80% red, 20% yellow, no odor or drainage.  Assessed buttocks and back, no pressure injuries noted. Pressure Ulcer POA: Yes Dressing procedure/placement/frequency: No topical treatment indicated at this time.  Air mattress ordered to decrease pressure and discomfort.  Discussed plan of care with patient and he verbalized understanding. Please re-consult if further assistance is needed.  Thank-you,  Julien Girt MSN, [place], [place], [place], [place]

## 2016-03-30 NOTE — ED Notes (Signed)
Attempted to give report 

## 2016-03-30 NOTE — ED Notes (Signed)
EKG given to Dr. Allen 

## 2016-03-30 NOTE — Consult Note (Signed)
Reason for Consult: Left femoral neck fracture Referring Physician: ER  Darryl Diaz is an 75 y.o. male.  HPI: 75 yo male found down in his own residence by his sister and brother in law who cam from [place] to pick him up on their way to  for Thanksgiving. Reports mechanical fall No other reports of LE or UE pains On Eliquis but has not taken in past 3 days due to HPI Family in room He lives alone  Past Medical History:  Diagnosis Date  . Alcoholism ([place])    in remission following wife's death  . Atrial fibrillation ([place])   . Fall at home 03/28/2016  . GERD (gastroesophageal reflux disease)   . H/O seasonal allergies   . Hyperlipidemia   . Hypertension   . Insomnia   . 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 OA (osteoarthritis)    hands    Past Surgical History:  Procedure Laterality Date  . ANKLE SURGERY Left 2014   had rods put in   . TONSILLECTOMY      Family History  Problem Relation Age of Onset  . Heart disease Mother 50  . Hypertension Mother   . Arthritis Mother   . Heart failure Father 5  . Stroke Maternal Aunt   . Heart failure Sister     Social History:  reports that he has quit smoking. He has never used smokeless tobacco. He reports that he drinks alcohol. He reports that he does not use drugs.  Allergies:  Allergies  Allergen Reactions  . Penicillins     Childhood allergy    Medications:  I have reviewed the patient's current medications. Scheduled: . atorvastatin  20 mg Oral Daily  . escitalopram  10 mg Oral Daily  . [START ON 03/31/2016] ferrous sulfate  325 mg Oral Q breakfast  . metoprolol  200 mg Oral Daily  . pantoprazole  40 mg Oral Daily  . sodium chloride flush  3 mL Intravenous Q12H  . traZODone  50 mg Oral QHS    Results for orders placed or performed during the hospital encounter of 03/30/16 (from the past 24 hour(s))  CBC with Differential     Status: Abnormal   Collection Time: 03/30/16  10:40 AM  Result Value Ref Range   WBC 19.6 (H) 4.0 - 10.5 K/uL   RBC 4.48 4.22 - 5.81 MIL/uL   Hemoglobin 14.6 13.0 - 17.0 g/dL   HCT 41.8 39.0 - 52.0 %   MCV 93.3 78.0 - 100.0 fL   MCH 32.6 26.0 - 34.0 pg   MCHC 34.9 30.0 - 36.0 g/dL   RDW 14.7 11.5 - 15.5 %   Platelets 161 150 - 400 K/uL   Neutrophils Relative % 86 %   Neutro Abs 16.9 (H) 1.7 - 7.7 K/uL   Lymphocytes Relative 6 %   Lymphs Abs 1.1 0.7 - 4.0 K/uL   Monocytes Relative 8 %   Monocytes Absolute 1.6 (H) 0.1 - 1.0 K/uL   Eosinophils Relative 0 %   Eosinophils Absolute 0.0 0.0 - 0.7 K/uL   Basophils Relative 0 %   Basophils Absolute 0.0 0.0 - 0.1 K/uL  Comprehensive metabolic panel     Status: Abnormal   Collection Time: 03/30/16 10:40 AM  Result Value Ref Range   Sodium 139 135 - 145 mmol/L   Potassium 3.7 3.5 - 5.1 mmol/L   Chloride 102 101 - 111 mmol/L   CO2 22 22 -  32 mmol/L   Glucose, Bld 162 (H) 65 - 99 mg/dL   BUN 14 6 - 20 mg/dL   Creatinine, Ser 1.51 (H) 0.61 - 1.24 mg/dL   Calcium 9.1 8.9 - 10.3 mg/dL   Total Protein 6.7 6.5 - 8.1 g/dL   Albumin 3.8 3.5 - 5.0 g/dL   AST 85 (H) 15 - 41 U/L   ALT 54 17 - 63 U/L   Alkaline Phosphatase 119 38 - 126 U/L   Total Bilirubin 2.1 (H) 0.3 - 1.2 mg/dL   GFR calc non Af Amer 44 (L) >60 mL/min   GFR calc Af Amer 51 (L) >60 mL/min   Anion gap 15 5 - 15  CK     Status: Abnormal   Collection Time: 03/30/16 10:40 AM  Result Value Ref Range   Total CK 1,192 (H) 49 - 397 U/L  Protime-INR     Status: None   Collection Time: 03/30/16 10:40 AM  Result Value Ref Range   Prothrombin Time 14.1 11.4 - 15.2 seconds   INR 1.09   Type and screen [place]     Status: None   Collection Time: 03/30/16 10:40 AM  Result Value Ref Range   ABO/RH(D) O POS    Antibody Screen NEG    Sample Expiration 04/02/2016   ABO/Rh     Status: None   Collection Time: 03/30/16 10:40 AM  Result Value Ref Range   ABO/RH(D) O POS   I-Stat Troponin, ED (not at [facility])      Status: None   Collection Time: 03/30/16 10:59 AM  Result Value Ref Range   Troponin i, poc 0.04 0.00 - 0.08 ng/mL   Comment 3          I-Stat CG4 Lactic Acid, ED     Status: Abnormal   Collection Time: 03/30/16 11:02 AM  Result Value Ref Range   Lactic Acid, Venous 2.73 (HH) 0.5 - 1.9 mmol/L   Comment NOTIFIED PHYSICIAN   Urinalysis, Routine w reflex microscopic (not at [facility])     Status: Abnormal   Collection Time: 03/30/16 12:53 PM  Result Value Ref Range   Color, Urine AMBER (A) YELLOW   APPearance CLEAR CLEAR   Specific Gravity, Urine 1.029 1.005 - 1.030   pH 5.5 5.0 - 8.0   Glucose, UA NEGATIVE NEGATIVE mg/dL   Hgb urine dipstick MODERATE (A) NEGATIVE   Bilirubin Urine MODERATE (A) NEGATIVE   Ketones, ur 40 (A) NEGATIVE mg/dL   Protein, ur 100 (A) NEGATIVE mg/dL   Nitrite NEGATIVE NEGATIVE   Leukocytes, UA NEGATIVE NEGATIVE  Urine microscopic-add on     Status: Abnormal   Collection Time: 03/30/16 12:53 PM  Result Value Ref Range   Squamous Epithelial / LPF 0-5 (A) NONE SEEN   WBC, UA 0-5 0 - 5 WBC/hpf   RBC / HPF 0-5 0 - 5 RBC/hpf   Bacteria, UA FEW (A) NONE SEEN   Casts GRANULAR CAST (A) NEGATIVE   Urine-Other MUCOUS PRESENT   I-Stat CG4 Lactic Acid, ED     Status: Abnormal   Collection Time: 03/30/16  1:09 PM  Result Value Ref Range   Lactic Acid, Venous 2.39 (HH) 0.5 - 1.9 mmol/L   Comment NOTIFIED PHYSICIAN   Troponin I     Status: Abnormal   Collection Time: 03/30/16  1:50 PM  Result Value Ref Range   Troponin I 0.08 (HH) <0.03 ng/mL    X-ray: CLINICAL DATA:  Fall.  EXAM: DG HIP (WITH OR WITHOUT PELVIS) 2-3V LEFT  COMPARISON:  No recent prior.  FINDINGS: Degenerative changes lumbar spine and both hips. Angulated fracture of the left femoral neck is noted. No other focal bony abnormalities identified.  IMPRESSION: Angulated fracture of the left femoral neck.   Electronically Signed   By: Marcello Moores  Register  ROS  As reported in HPI No  recent cough, chest pain No recent hospitalizations  Blood pressure (!) 159/75, pulse 89, temperature 98.6 F (37 C), temperature source Oral, resp. rate 17, height 6' (1.829 m), weight 106.6 kg (235 lb), SpO2 96 %.  Physical Exam  Awake alert Comfortable in bed Newly found pressure wound concerns at left ankle, left knee  Right knee abrasion, no open wound  Pain with movement involving left hip NVI  Assessment/Plan: Displaced left femoral neck fracture Dehydration +/- early findings of rhabdomyolysis (elevated Cr, protein in urine, elevated troponin)  Needs his left hip repaired with left hip hemiarthroplasty Will plan on medical management, hydration and follow up of his labs in am.  If renal function improving then will consider procedure Thursday or Friday Reviewed with patient and family concerns and needs Urgent order set completed NPO after midnight in case of OR tomorrow IVF per medicine  Willadean Guyton D 03/30/2016, 4:28 PM

## 2016-03-30 NOTE — Progress Notes (Signed)
Returned to pray with pt. Brother-in-law in rm joined in while sister was on phone. Provided spiritual/emotional support and prayer. Pt really appreciated prayer. Chaplain available for follow-up.   03/30/16 1600  Clinical Encounter Type  Visited With Patient and family together  Visit Type Follow-up;Spiritual support  Referral From Chaplain  Spiritual Encounters  Spiritual Needs Prayer;Emotional  Stress Factors  Patient Stress Factors Health changes  Family Stress Factors Family relationships;Health changes  Gerrit Heck, Chaplain

## 2016-03-30 NOTE — ED Provider Notes (Addendum)
Medical screening examination/treatment/procedure(s) were conducted as a shared visit with non-physician practitioner(s) and myself.  I personally evaluated the patient during the encounter.   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 Has acute hip fracture noted on his x-ray as well as evidence of acute kidney injury likely from dehydration. Will consult orthopedics as well as a hospice for admission   Lacretia Leigh, MD 03/30/16 1144    Lacretia Leigh, MD 03/30/16 1145

## 2016-03-30 NOTE — ED Provider Notes (Signed)
[facility] DEPT Provider Note   CSN: [id number] Arrival date & time: 03/30/16  1003     History   Chief Complaint Chief Complaint  Patient presents with  . Fall    HPI Darryl Diaz is a 75 y.o. male.  HPI Darryl Diaz is a 75 y.o. male with history of hypertension, atrial fibrillation, depression, GERD, presents to emergency department after a fall. Patient states that he has had some diarrhea, and got up too quickly to go to the bathroom, and states he fell onto the left hip. This occurred 2 days ago. He states that he was not able to get up or exclude due to pain in his hip. He did not hit his head. He denies any pain in his neck or back. He reports mild pain in his bilateral knees and right elbow. He did not have loss of consciousness. He states he has been on the floor for 2 days until family found him this morning. Patient denies feeling dizzy or lightheaded prior to the fall, states "I just went to quick." Patient does take Eliquis for A. Fib. He received 151mcg of fentanyl by EMS  Past Medical History:  Diagnosis Date  . 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 GERD (gastroesophageal reflux disease)   . H/O seasonal allergies   . Hyperlipidemia   . Hypertension   . Insomnia   . 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 OA (osteoarthritis)    hands    There are no active problems to display for this patient.   Past Surgical History:  Procedure Laterality Date  . ANKLE SURGERY Left 2014   had rods put in   . TONSILLECTOMY         Home Medications    Prior to Admission medications   Medication Sig Start Date End Date Taking? Authorizing Provider  apixaban (ELIQUIS) 5 MG TABS tablet Take 5 mg by mouth 2 (two) times daily.    Historical Provider, MD  atorvastatin (LIPITOR) 20 MG tablet Take 20 mg by mouth daily.    Historical Provider, MD  b complex vitamins capsule Take 1 capsule by mouth daily.    Historical Provider,  MD  cetirizine (ZYRTEC) 10 MG tablet Take 10 mg by mouth daily.    Historical Provider, MD  escitalopram (LEXAPRO) 10 MG tablet Take 10 mg by mouth daily.    Historical Provider, MD  ferrous sulfate 325 (65 FE) MG EC tablet Take 325 mg by mouth daily with breakfast.    Historical Provider, MD  folic acid (FOLVITE) 1 MG tablet Take 1 mg by mouth daily.    Historical Provider, MD  metoprolol (TOPROL-XL) 200 MG 24 hr tablet Take 200 mg by mouth daily.    Historical Provider, MD  Multiple Vitamin (MULTIVITAMIN) capsule Take 1 capsule by mouth daily.    Historical Provider, MD  pantoprazole (PROTONIX) 40 MG tablet Take 40 mg by mouth daily.    Historical Provider, MD  traZODone (DESYREL) 50 MG tablet Take 50 mg by mouth at bedtime.    Historical Provider, MD    Family History Family History  Problem Relation Age of Onset  . Heart disease Mother 26  . Hypertension Mother   . Arthritis Mother   . Heart failure Father 26  . Stroke Maternal Aunt   . Heart failure Sister     Social History Social History  Substance Use Topics  . Smoking status: Former Research scientist (life sciences)  .  Smokeless tobacco: Never Used  . Alcohol use Yes     Allergies   Penicillins   Review of Systems Review of Systems  Constitutional: Negative for chills and fever.  Respiratory: Negative for cough, chest tightness and shortness of breath.   Cardiovascular: Negative for chest pain, palpitations and leg swelling.  Gastrointestinal: Positive for diarrhea. Negative for abdominal distention, abdominal pain, nausea and vomiting.  Genitourinary: Negative for dysuria, frequency, hematuria and urgency.  Musculoskeletal: Positive for arthralgias. Negative for back pain, myalgias, neck pain and neck stiffness.  Skin: Negative for rash.  Allergic/Immunologic: Negative for immunocompromised state.  Neurological: Negative for dizziness, weakness, light-headedness, numbness and headaches.  All other systems reviewed and are  negative.    Physical Exam Updated Vital Signs BP 150/100 (BP Location: Left Arm)   Pulse 97   Temp 98 F (36.7 C) (Oral)   Resp 17   Ht 6' (1.829 m)   Wt 106.6 kg   SpO2 98%   BMI 31.87 kg/m   Physical Exam  Constitutional: He is oriented to person, place, and time. He appears well-developed and well-nourished. No distress.  HENT:  Head: Normocephalic and atraumatic.  Oral mucosa, tongue, lips are dry  Eyes: Conjunctivae and EOM are normal. Pupils are equal, round, and reactive to light.  Neck: Normal range of motion. Neck supple.  No midline tenderness, full range of motion with no pain  Cardiovascular: Normal rate, regular rhythm and normal heart sounds.   Pulmonary/Chest: Effort normal and breath sounds normal. No respiratory distress. He has no wheezes. He has no rales. He exhibits no tenderness.  Abdominal: Soft. Bowel sounds are normal. He exhibits no distension. There is no tenderness. There is no rebound.  Musculoskeletal: He exhibits no edema.  No midline tenderness over thoracic or lumbar spine, no paravertebral tenderness. Full range of motion of bilateral upper extremities, tenderness to palpation and pain with range of motion of the right elbow. Distal radial pulses intact and equal bilaterally. Abrasions to the bilateral anterior knees with no tenderness to palpation over bilateral knee joints. Full range of motion of the right hip and right knee. Tenderness to palpation over her left hip. Left leg is internally rotated and shortened. Unable to assess range of motion of the left knee due to hip pain. Dorsal pedal pulses intact and equal bilaterally. Swelling noted to the left ankle, patient states this is chronic. Abrasion to the left medial knee ankle.  Neurological: He is alert and oriented to person, place, and time.  Skin: Skin is warm and dry.  Nursing note and vitals reviewed.    ED Treatments / Results  Labs (all labs ordered are listed, but only abnormal  results are displayed) Labs Reviewed  CBC WITH DIFFERENTIAL/PLATELET - Abnormal; Notable for the following:       Result Value   WBC 19.6 (*)    Neutro Abs 16.9 (*)    Monocytes Absolute 1.6 (*)    All other components within normal limits  COMPREHENSIVE METABOLIC PANEL - Abnormal; Notable for the following:    Glucose, Bld 162 (*)    Creatinine, Ser 1.51 (*)    AST 85 (*)    Total Bilirubin 2.1 (*)    GFR calc non Af Amer 44 (*)    GFR calc Af Amer 51 (*)    All other components within normal limits  CK - Abnormal; Notable for the following:    Total CK 1,192 (*)    All other components within  normal limits  URINALYSIS, ROUTINE W REFLEX MICROSCOPIC (NOT AT [facility]) - Abnormal; Notable for the following:    Color, Urine AMBER (*)    Hgb urine dipstick MODERATE (*)    Bilirubin Urine MODERATE (*)    Ketones, ur 40 (*)    Protein, ur 100 (*)    All other components within normal limits  URINE MICROSCOPIC-ADD ON - Abnormal; Notable for the following:    Squamous Epithelial / LPF 0-5 (*)    Bacteria, UA FEW (*)    Casts GRANULAR CAST (*)    All other components within normal limits  TROPONIN I - Abnormal; Notable for the following:    Troponin I 0.08 (*)    All other components within normal limits  I-STAT CG4 LACTIC ACID, ED - Abnormal; Notable for the following:    Lactic Acid, Venous 2.73 (*)    All other components within normal limits  I-STAT CG4 LACTIC ACID, ED - Abnormal; Notable for the following:    Lactic Acid, Venous 2.39 (*)    All other components within normal limits  URINE CULTURE  CULTURE, BLOOD (ROUTINE X 2)  CULTURE, BLOOD (ROUTINE X 2)  PROTIME-INR  TROPONIN I  TROPONIN I  COMPREHENSIVE METABOLIC PANEL  CBC  PROTIME-INR  CK  I-STAT TROPOININ, ED  TYPE AND SCREEN  ABO/RH    EKG  EKG Interpretation  Date/Time:  Wednesday March 30 2016 10:13:32 EST Ventricular Rate:  95 PR Interval:    QRS Duration: 97 QT Interval:  341 QTC  Calculation: 429 R Axis:   2 Text Interpretation:  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  Procedures Procedures (including critical care time)  Medications Ordered in ED Medications  HYDROmorphone (DILAUDID) injection 0.5 mg (not administered)  HYDROmorphone (DILAUDID) injection 0.5 mg (0.5 mg Intravenous Given 03/30/16 1312)  ondansetron (ZOFRAN) injection 4 mg (not administered)  atorvastatin (LIPITOR) tablet 20 mg (20 mg Oral Given 03/30/16 1623)  escitalopram (LEXAPRO) tablet 10 mg (10 mg Oral Given 03/30/16 1623)  metoprolol succinate (TOPROL-XL) 24 hr tablet 200 mg (200 mg Oral Given 03/30/16 1624)  pantoprazole (PROTONIX) EC tablet 40 mg (40 mg Oral Given 03/30/16 1624)  traZODone (DESYREL) tablet 50 mg (not administered)  0.9 %  sodium chloride infusion ( Intravenous Restarted 03/30/16 1357)  acetaminophen (TYLENOL) tablet 650 mg (not administered)    Or  acetaminophen (TYLENOL) suppository 650 mg (not administered)  HYDROcodone-acetaminophen (NORCO/VICODIN) 5-325 MG per tablet 1-2 tablet (not administered)  senna-docusate (Senokot-S) tablet 1 tablet (not administered)  bisacodyl (DULCOLAX) suppository 10 mg (not administered)  magnesium citrate solution 1 Bottle (not administered)  sodium chloride flush (NS) 0.9 % injection 3 mL (3 mLs Intravenous Given 03/30/16 1625)  ferrous sulfate tablet 325 mg (not administered)  sodium chloride 0.9 % bolus 1,000 mL (0 mLs Intravenous Stopped 03/30/16 1152)  0.9 %  sodium chloride infusion ( Intravenous Stopped 03/30/16 1159)  fentaNYL (SUBLIMAZE) injection 50 mcg (50 mcg Intravenous Given 03/30/16 1152)  0.9 %  sodium chloride infusion ( Intravenous Stopped 03/30/16 1357)     Initial Impression / Assessment and Plan / ED Course  I have reviewed the  triage vital signs and the nursing notes.  Pertinent labs & imaging results that were available during my care of the patient were reviewed by me and considered  in my medical decision making (see chart for details).  Clinical Course    Patient with mechanical fall 2 days ago, has been on the floor since then. Concern for left hip fracture and rhabdomyolysis. Will get labs, CK level, left hip and knee films. Patient is on Eliquis but has not had it in 2 days. He has no complaints at this time other than left hip pain. He did not hit his head during the fall.  12:22 PM Left femoral neck fracture on xray. Spoke with Dr. Alvan Dame, advised ok to start diet and fluids.  Spoke with [facility], will admit  Pt had poor pain control with fentanyl, switched to dilaudid. He is neurovascularly intact.   Vitals:   03/30/16 1330 03/30/16 1345 03/30/16 1438 03/30/16 1530  BP: 164/91 161/75 (!) 148/83 (!) 159/75  Pulse: 99 95 91 89  Resp: 20 16 17    Temp:   98.6 F (37 C)   TempSrc:   Oral   SpO2: 96% 96% 99% 96%  Weight:      Height:         Final Clinical Impressions(s) / ED Diagnoses   Final diagnoses:  Closed fracture of left hip, initial encounter [facility])  Non-traumatic rhabdomyolysis    New Prescriptions Current Discharge Medication List       Jeannett Senior, PA-C 03/30/16 2030

## 2016-03-30 NOTE — Care Management Note (Signed)
Case Management Note  Patient Details  Name: Darryl Diaz MRN: [id number] Date of Birth: December 21, 1940  Subjective/Objective:    Angulated fracture of the left femoral neck, waiting Ortho recommendations                 Action/Plan: Discharge Planning: Chart reviewed. Will continue to follow for dc needs.    Expected Discharge Date:                  Expected Discharge Plan:  Skilled Nursing Facility  In-House Referral:  Clinical Social Work  Discharge planning Services  CM Consult  Post Acute Care Choice:    Choice offered to:     DME Arranged:    DME Agency:     HH Arranged:    [place] Agency:     Status of Service:  In process, will continue to follow  If discussed at Long Length of Stay Meetings, dates discussed:    Additional Comments:  Erenest Rasher, RN 03/30/2016, 2:57 PM

## 2016-03-30 NOTE — H&P (Signed)
History and Physical    Darryl Diaz ZLD:[id number] DOB: 1940-08-18 DOA: 03/30/2016   PCP: [name], MD   Patient coming from:  Home   Chief Complaint: Left hip fracture  HPI: Darryl Diaz is a 75 y.o. male with a history of hypertension, atrial fibrillation, depression and [name], presenting to the emergency department after being found by his family on the floor leaning on his left hip for about 2 days. The patient was experiencing prior to this event diarrhea, getting up to quickly to go to the bathroom, falling on his left hip. He did not lose consciousness or hit his head. The patient reports staying in the same position during the period of time, unable to move. He denies any seizure activity, vision changes, chest pain, or further diarrhea. He reports lying on his own stools during the period of time, until his family, found him.  He denies any pain in his neck or back. He reports mild pain on both knees and right elbow. He denies any dizziness. He reports mild myalgias. He denies any dysuria, or darkening office urine. He denies any recent infections. He denies any fever or chills or night sweats. He reports intermittent nausea. He denies prior history of abdominal lysis. He denies any new activities are heavy lifting. He denies any numbness, weakness or paresthesias.  ED Course:  BP 161/75   Pulse 95   Temp 98 F (36.7 C) (Oral)   Resp 16   Ht 6' (1.829 m)   Wt 106.6 kg (235 lb)   SpO2 96%   BMI 31.87 kg/m   lactic acid initially 2.73, now 2.39. Troponin 0.04. Urinalysis Amber: negative for  leukocytes. Moderate hemoglobin in urine seen. No nitrites.  Chest x-ray negative for acute abnormalities. Right elbow x-ray negative left heap x-ray showed angulated fracture of the left femoral neck. Left knee without evidence of acute displaced fracture.  Review of Systems: As per HPI otherwise 10 point review of systems negative.   Past Medical History:  Diagnosis Date  .  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 GERD (gastroesophageal reflux disease)   . H/O seasonal allergies   . Hyperlipidemia   . Hypertension   . Insomnia   . 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 OA (osteoarthritis)    hands    Past Surgical History:  Procedure Laterality Date  . ANKLE SURGERY Left 2014   had rods put in   . TONSILLECTOMY      Social History Social History   Social History  . Marital status: Widowed    Spouse name: N/A  . Number of children: 2  . Years of education: college   Occupational History  . retired    Social History Main Topics  . Smoking status: Former Research scientist (life sciences)  . Smokeless tobacco: Never Used  . Alcohol use Yes  . Drug use: No  . Sexual activity: Not on file   Other Topics Concern  . Not on file   Social History Narrative  . No narrative on file     Allergies  Allergen Reactions  . Penicillins     Childhood allergy    Family History  Problem Relation Age of Onset  . Heart disease Mother 77  . Hypertension Mother   . Arthritis Mother   . Heart failure Father 63  . Stroke Maternal Aunt   . Heart failure Sister       Prior to  Admission medications   Medication Sig Start Date End Date Taking? Authorizing Provider  apixaban (ELIQUIS) 5 MG TABS tablet Take 5 mg by mouth 2 (two) times daily.   Yes Historical Provider, MD  atorvastatin (LIPITOR) 20 MG tablet Take 20 mg by mouth daily.   Yes Historical Provider, MD  b complex vitamins capsule Take 1 capsule by mouth daily.   Yes Historical Provider, MD  cetirizine (ZYRTEC) 10 MG tablet Take 10 mg by mouth daily.   Yes Historical Provider, MD  escitalopram (LEXAPRO) 10 MG tablet Take 10 mg by mouth daily.   Yes Historical Provider, MD  ferrous sulfate 325 (65 FE) MG EC tablet Take 325 mg by mouth daily with breakfast.   Yes Historical Provider, MD  folic acid (FOLVITE) 1 MG tablet Take 1 mg by mouth daily.   Yes Historical Provider, MD    metoprolol (TOPROL-XL) 200 MG 24 hr tablet Take 200 mg by mouth daily.   Yes Historical Provider, MD  Multiple Vitamin (MULTIVITAMIN) capsule Take 1 capsule by mouth daily.   Yes Historical Provider, MD  pantoprazole (PROTONIX) 40 MG tablet Take 40 mg by mouth daily.   Yes Historical Provider, MD  traZODone (DESYREL) 50 MG tablet Take 50 mg by mouth at bedtime.   Yes Historical Provider, MD    Physical Exam:    Vitals:   03/30/16 1300 03/30/16 1315 03/30/16 1330 03/30/16 1345  BP: (!) 176/151 170/85 164/91 161/75  Pulse: 97 107 99 95  Resp: 21 16 20 16   Temp:      TempSrc:      SpO2: 100% 98% 96% 96%  Weight:      Height:           Constitutional: NAD, Calm, alert, communicative. Vitals:   03/30/16 1300 03/30/16 1315 03/30/16 1330 03/30/16 1345  BP: (!) 176/151 170/85 164/91 161/75  Pulse: 97 107 99 95  Resp: 21 16 20 16   Temp:      TempSrc:      SpO2: 100% 98% 96% 96%  Weight:      Height:       Eyes: PERRL, lids and conjunctivae normal ENMT: Mucous membranes are Dry. Posterior pharynx clear of any exudate or lesions.Normal dentition.  Neck: normal, supple, no masses, no thyromegaly Respiratory: clear to auscultation bilaterally, no wheezing, no crackles. Normal respiratory effort. No accessory muscle use.  Cardiovascular: irregularly Regular rate and rhythm, no murmurs / rubs / gallops. No extremity edema. 2+ pedal pulses. No carotid bruits.  Abdomen: no tenderness, no masses palpated. No hepatosplenomegaly. Bowel sounds positive.  Musculoskeletal: remarkable for tenderness to palpation on the right elbow, left heap, no tenderness to palpation at the knee joints. Full range of motion on the right heap and right knee. Unable to assess range of motion on the left knee due to left heap pain. Swelling noted on the left ankle, which is chronic according to the patient. These aberration on the left knee BL knee and ankle, and on the right knee. Skin: no rashes, lesions,  ulcers.  Neurologic: CN 2-12 grossly intact. Sensation intact, DTR normal. Strength 5/5 in all 4.  Psychiatric: Normal judgment and insight. Alert and oriented x 3. Normal mood.     Labs on Admission: I have personally reviewed following labs and imaging studies  CBC:  Recent Labs Lab 03/30/16 1040  WBC 19.6*  NEUTROABS 16.9*  HGB 14.6  HCT 41.8  MCV 93.3  PLT 628    Basic Metabolic Panel:  Recent Labs Lab 03/30/16 1040  NA 139  K 3.7  CL 102  CO2 22  GLUCOSE 162*  BUN 14  CREATININE 1.51*  CALCIUM 9.1    GFR: Estimated Creatinine Clearance: 54.2 mL/min (by C-G formula based on SCr of 1.51 mg/dL (H)).  Liver Function Tests:  Recent Labs Lab 03/30/16 1040  AST 85*  ALT 54  ALKPHOS 119  BILITOT 2.1*  PROT 6.7  ALBUMIN 3.8   No results for input(s): LIPASE, AMYLASE in the last 168 hours. No results for input(s): AMMONIA in the last 168 hours.  Coagulation Profile:  Recent Labs Lab 03/30/16 1040  INR 1.09    Cardiac Enzymes:  Recent Labs Lab 03/30/16 1040  CKTOTAL 1,192*    BNP (last 3 results) No results for input(s): PROBNP in the last 8760 hours.  HbA1C: No results for input(s): HGBA1C in the last 72 hours.  CBG: No results for input(s): GLUCAP in the last 168 hours.  Lipid Profile: No results for input(s): CHOL, HDL, LDLCALC, TRIG, CHOLHDL, LDLDIRECT in the last 72 hours.  Thyroid Function Tests: No results for input(s): TSH, T4TOTAL, FREET4, T3FREE, THYROIDAB in the last 72 hours.  Anemia Panel: No results for input(s): VITAMINB12, FOLATE, FERRITIN, TIBC, IRON, RETICCTPCT in the last 72 hours.  Urine analysis:    Component Value Date/Time   COLORURINE AMBER (A) 03/30/2016 1253   APPEARANCEUR CLEAR 03/30/2016 1253   LABSPEC 1.029 03/30/2016 1253   PHURINE 5.5 03/30/2016 1253   GLUCOSEU NEGATIVE 03/30/2016 1253   HGBUR MODERATE (A) 03/30/2016 1253   BILIRUBINUR MODERATE (A) 03/30/2016 1253   KETONESUR 40 (A) 03/30/2016  1253   PROTEINUR 100 (A) 03/30/2016 1253   NITRITE NEGATIVE 03/30/2016 1253   LEUKOCYTESUR NEGATIVE 03/30/2016 1253    Sepsis Labs: @LABRCNTIP (procalcitonin:4,lacticidven:4) )No results found for this or any previous visit (from the past 240 hour(s)).   Radiological Exams on Admission: Dg Chest 1 View  Result Date: 03/30/2016 CLINICAL DATA:  Fall. EXAM: CHEST 1 VIEW COMPARISON:  No prior. FINDINGS: Mediastinum and hilar structures are normal. Lungs are clear of acute infiltrates. Tiny calcified nodules left upper lobe consistent with tiny granulomas. Heart size normal. No pleural effusion or pneumothorax. No acute bony abnormality. Degenerative changes thoracic spine . IMPRESSION: No acute abnormality. Electronically Signed   By: Marcello Moores  Register   On: 03/30/2016 11:34   Dg Elbow Complete Right  Result Date: 03/30/2016 CLINICAL DATA:  Fall. EXAM: RIGHT ELBOW - COMPLETE 3+ VIEW COMPARISON:  No recent prior. FINDINGS: No acute bony or joint abnormality identified. Small corticated densities noted adjacent to the radial epicondyle of the distal humerus most likely from prior injury. Diffuse degenerative change. IMPRESSION: No acute abnormality. Electronically Signed   By: Marcello Moores  Register   On: 03/30/2016 11:32   Dg Knee Complete 4 Views Left  Result Date: 03/30/2016 CLINICAL DATA:  75 year old male with a history of fall and left leg pain EXAM: LEFT KNEE - COMPLETE 4+ VIEW COMPARISON:  None. FINDINGS: No acute displaced fracture. Degenerative changes of the knee. No focal soft tissue swelling. Evaluation of joint effusion not optimized secondary to rotation on the lateral view. IMPRESSION: No evidence of acute displaced fracture at the knee. Signed, Dulcy Fanny. Earleen Newport, DO [facility]iology Electronically Signed   By: Corrie Mckusick D.O.   On: 03/30/2016 11:33   Dg Hip Unilat W Or Wo Pelvis 2-3 Views Left  Result Date: 03/30/2016 CLINICAL  DATA:  Fall. EXAM: DG HIP (  WITH OR WITHOUT PELVIS) 2-3V LEFT COMPARISON:  No recent prior. FINDINGS: Degenerative changes lumbar spine and both hips. Angulated fracture of the left femoral neck is noted. No other focal bony abnormalities identified. IMPRESSION: Angulated fracture of the left femoral neck. Electronically Signed   By: Marcello Moores  Register   On: 03/30/2016 11:31    EKG: Independently reviewed.  Assessment/Plan Active Problems:   Hip fracture (HCC)   Rhabdomyolysis   Hypertension   Hyperlipidemia   GERD (gastroesophageal reflux disease)   Osteoarthritis   Depression   Leukocytosis    Angulated fracture of the left femoral neck  CXR, right elbow and left knee negative for abnormalities  Received  IV pain meds, immobilized.   Received Fentanyl  injections with some relief. He also received IV fluids 2 L, now at the rate of 100 cc hour. Admit to Tele inpatient  anticipating surgery as per Ortho not today, will await their input  SCDs IVF Pain control Hold ASA PT/OT consult Wound care consult for the areas of abrasion    Rhanbomyolysis in the setting of closed fracture of the hip with long lie for 2 days CK 1192.  K 3.3 Lactic acid initially 2.73, now 2.39.  Urinalysis Amber: negative for  leukocytes. Moderate hemoglobin in urine seen. No nitrites. WBC 19.6 in the setting of pain, inflammation and dehydration . Bili 2.1 Air overlay mattress  IVF at 100 cc/h   Blood and urine culture  CK in am   Atrial Fibrillation CHA2DS2-VASc score 2-3 , on anticoagulation with Eliquis . EKG Afib without ACS. TN 0.04  Rate controlled Hold Eliquis    Hypertension BP 161/75   Pulse 95   Temp 98 F (36.7 C) (Oral)   Resp 16   Ht 6' (1.829 m)   Wt 106.6 kg (235 lb)   SpO2 96%   BMI 31.87 kg/m  Controlled Continue home anti-hypertensive medications with Toprol   Hyperlipidemia Continue home statins  Insomnia /Depression Continue home  Desyrel and Lexapro  GERD, no acute  symptoms: Continue PPI   DVT prophylaxis SCDs. Eliquis on hold  Code Status:   Full     Family Communication:  Discussed with patient Disposition Plan: Expect patient to be discharged to home after condition improves Consults called:    None Admission status:Tele Inpatient    [facility] E, PA-C [facility]   03/30/2016, 1:56 PM

## 2016-03-31 ENCOUNTER — Encounter (HOSPITAL_COMMUNITY): Payer: Self-pay | Admitting: *Deleted

## 2016-03-31 DIAGNOSIS — K219 Gastro-esophageal reflux disease without esophagitis: Secondary | ICD-10-CM

## 2016-03-31 DIAGNOSIS — T796XXD Traumatic ischemia of muscle, subsequent encounter: Secondary | ICD-10-CM

## 2016-03-31 LAB — PROTIME-INR
INR: 1.1
Prothrombin Time: 14.2 seconds (ref 11.4–15.2)

## 2016-03-31 LAB — TROPONIN I: Troponin I: 0.04 ng/mL (ref <0.03)

## 2016-03-31 LAB — COMPREHENSIVE METABOLIC PANEL
ALT: 42 U/L (ref 17–63)
AST: 63 U/L — ABNORMAL HIGH (ref 15–41)
Albumin: 3 g/dL — ABNORMAL LOW (ref 3.5–5.0)
Alkaline Phosphatase: 104 U/L (ref 38–126)
Anion gap: 8 (ref 5–15)
BUN: 13 mg/dL (ref 6–20)
CO2: 24 mmol/L (ref 22–32)
Calcium: 8.2 mg/dL — ABNORMAL LOW (ref 8.9–10.3)
Chloride: 106 mmol/L (ref 101–111)
Creatinine, Ser: 1.16 mg/dL (ref 0.61–1.24)
GFR calc Af Amer: >60 mL/min (ref >60)
GFR calc non Af Amer: >60 mL/min (ref >60)
Glucose, Bld: 109 mg/dL — ABNORMAL HIGH (ref 65–99)
Potassium: 3.5 mmol/L (ref 3.5–5.1)
Sodium: 138 mmol/L (ref 135–145)
Total Bilirubin: 1.1 mg/dL (ref 0.3–1.2)
Total Protein: 5.3 g/dL — ABNORMAL LOW (ref 6.5–8.1)

## 2016-03-31 LAB — GLUCOSE, CAPILLARY: Glucose-Capillary: 110 mg/dL — ABNORMAL HIGH (ref 65–99)

## 2016-03-31 LAB — CBC
HCT: 34.6 % — ABNORMAL LOW (ref 39.0–52.0)
Hemoglobin: 11.7 g/dL — ABNORMAL LOW (ref 13.0–17.0)
MCH: 32.4 pg (ref 26.0–34.0)
MCHC: 33.8 g/dL (ref 30.0–36.0)
MCV: 95.8 fL (ref 78.0–100.0)
Platelets: 119 10*3/uL — ABNORMAL LOW (ref 150–400)
RBC: 3.61 MIL/uL — ABNORMAL LOW (ref 4.22–5.81)
RDW: 15.3 % (ref 11.5–15.5)
WBC: 12.4 10*3/uL — ABNORMAL HIGH (ref 4.0–10.5)

## 2016-03-31 LAB — URINE CULTURE

## 2016-03-31 LAB — MRSA PCR SCREENING: MRSA by PCR: NEGATIVE

## 2016-03-31 LAB — CK: Total CK: 650 U/L — ABNORMAL HIGH (ref 49–397)

## 2016-03-31 MED ORDER — SODIUM CHLORIDE 0.9 % IV SOLN
INTRAVENOUS | Status: DC
  Administered 2016-03-31: 21:00:00 via INTRAVENOUS

## 2016-03-31 MED ORDER — HYDROMORPHONE HCL 2 MG/ML IJ SOLN: 0.5000 mg | INTRAMUSCULAR | Status: DC | PRN

## 2016-03-31 MED ORDER — CEFAZOLIN SODIUM-DEXTROSE 2-4 GM/100ML-% IV SOLN: 2.0000 g | INTRAVENOUS | Status: DC

## 2016-03-31 MED ORDER — POVIDONE-IODINE 10 % EX SWAB
2.0000 "application " | Freq: Once | CUTANEOUS | Status: AC
  Administered 2016-04-01: 2 via TOPICAL

## 2016-03-31 MED ORDER — VANCOMYCIN HCL 10 G IV SOLR
1500.0000 mg | Freq: Once | INTRAVENOUS | Status: AC
  Administered 2016-04-01: 1500 mg via INTRAVENOUS
  Filled 2016-03-31: qty 1500

## 2016-03-31 MED ORDER — CHLORHEXIDINE GLUCONATE 4 % EX LIQD
60.0000 mL | Freq: Once | CUTANEOUS | Status: AC
  Administered 2016-04-01: 4 via TOPICAL

## 2016-03-31 MED ORDER — ENOXAPARIN SODIUM 40 MG/0.4ML ~~LOC~~ SOLN
40.0000 mg | Freq: Once | SUBCUTANEOUS | Status: AC
  Administered 2016-03-31: 40 mg via SUBCUTANEOUS
  Filled 2016-03-31: qty 0.4

## 2016-03-31 NOTE — Progress Notes (Signed)
Patient ID: Darryl Diaz, male   DOB: [birth year], 75 y.o.   MRN: [id number]  Patient stable for now No events  Cr.- 1.16 down from 1.5 Troponin decreasing  At this point plan on going to the OR tomorrow am for left hip hemiarthroplasty Lovenox today NPO after midnight Consent ordered

## 2016-03-31 NOTE — Progress Notes (Signed)
PROGRESS NOTE  Darryl Diaz  GYJ:[id number] DOB: 1940-09-12  DOA: 03/30/2016 PCP: [name], MD   Brief Narrative:  75 year old male with history of HLD, HTN, MDD, GERD, atrial fibrillation presented to ED after he was found by his family on the floor where he had been for about 2 days. He apparently had diarrhea and got up quickly go to the bathroom sustaining a fall onto his left hip. No history of LOC or head injury. He apparently stayed in the same position in his stools for extended period of time due to inability to move until his family found him. Imaging confirmed angulated fracture of left femoral neck. Orthopedics consulted and planned surgery 11/24.   Assessment & Plan:   Active Problems:   Hip fracture (HCC)   Hypertension   Hyperlipidemia   GERD (gastroesophageal reflux disease)   Osteoarthritis   Depression   Rhabdomyolysis   Leukocytosis   Pressure injury of skin   Closed fracture of left hip (HCC)   1. Displaced left femoral neck fracture: Sustained status post mechanical fall. Orthopedics consultation and follow-up appreciated. They plan left hip hemiarthroplasty for 04/01/16. Based on available data, patient is at moderate risk for perioperative CV events. He is optimized and may proceed with indicated surgery with close perioperative monitoring. Continue perioperative beta blockers. 2. Acute kidney injury: Likely related to dehydration from poor oral intake. Resolved after IV fluid hydration. 3. Mild rhabdomyolysis: Presented with CK of 1192. This is improved to 650 after IV fluids. Continue IV fluids and follow CK in a.m. Temporarily hold statins. 4. Essential hypertension: Mildly uncontrolled at times. Continue metoprolol. 5. Hyperlipidemia: Hold statins due to rhabdomyolysis. 6. GERD: Continue PPI. 7. Paroxysmal Atrial fibrillation: EKG 03/30/16 confirms sinus rhythm without acute changes. Continue metoprolol. Eliquis on hold for surgery. Patient was seen  by his primary cardiologist Dr. Burt Knack on 12/28/15. He is awaiting records from out of state (patient recently moved 4 months ago from [place]). CHADS-Vasc = 2 8. Diarrhea: Seems to have resolved. 9. Anemia: Not sure if he has chronic anemia. Initial hemoglobin of 14.6 may be falsely high due to hemoconcentration from dehydration. Follow CBC in a.m. 10. Thrombocytopenia: Follow CBC in a.m. 11. Elevated troponin: Most likely secondary to rhabdomyolysis. EKG without acute changes. Decreasing trend. No chest pain reported.   DVT prophylaxis: SCD's Code Status: Full Family Communication: None at bedside Disposition Plan: to be determined post op.   Consultants:   Orthopedics  Procedures:   None  Antimicrobials:   None    Subjective: States that he has mild pain in left hip with movement. Moved from [place] to [place] approximately 4 months ago. Indicates that he ambulates up to half a mile without chest pain or dyspnea. Lives alone and is independent of activities of daily living. No diarrhea, abdominal pain, nausea or vomiting reported.  Objective:  Vitals:   03/30/16 2030 03/31/16 0020 03/31/16 0451 03/31/16 1500  BP: (!) 141/78 (!) 139/94 138/74 (!) 129/54  Pulse: 86 70 69 84  Resp: 16 16 16 18   Temp: 99 F (37.2 C) 99.3 F (37.4 C) 98.9 F (37.2 C) 98.5 F (36.9 C)  TempSrc: Oral Oral Oral Oral  SpO2: 94% 94% 94% 95%  Weight:      Height:        Intake/Output Summary (Last 24 hours) at 03/31/16 1627 Last data filed at 03/31/16 1500  Gross per 24 hour  Intake  940 ml  Output              550 ml  Net              390 ml   Filed Weights   03/30/16 1006  Weight: 106.6 kg (235 lb)    Examination:  General exam: Pleasant elderly male lying comfortably propped up in bed. Respiratory system: Clear to auscultation. Respiratory effort normal. Cardiovascular system: S1 & S2 heard, RRR. No JVD, murmurs, rubs, gallops or clicks. No pedal  edema. Gastrointestinal system: Abdomen is nondistended, soft and nontender. No organomegaly or masses felt. Normal bowel sounds heard. Central nervous system: Alert and oriented. No focal neurological deficits. Extremities: Symmetric 5 x 5 power. Left lower extremity shortened and externally rotated and movements limited secondary to pain. Multiple superficial abrasions on bilateral legs. Skin: No rashes, lesions or ulcers Psychiatry: Judgement and insight appear normal. Mood & affect appropriate.     Data Reviewed: I have personally reviewed following labs and imaging studies  CBC:  Recent Labs Lab 03/30/16 1040 03/31/16 0416  WBC 19.6* 12.4*  NEUTROABS 16.9*  --   HGB 14.6 11.7*  HCT 41.8 34.6*  MCV 93.3 95.8  PLT 161 166*   Basic Metabolic Panel:  Recent Labs Lab 03/30/16 1040 03/31/16 0416  NA 139 138  K 3.7 3.5  CL 102 106  CO2 22 24  GLUCOSE 162* 109*  BUN 14 13  CREATININE 1.51* 1.16  CALCIUM 9.1 8.2*   GFR: Estimated Creatinine Clearance: 70.5 mL/min (by C-G formula based on SCr of 1.16 mg/dL). Liver Function Tests:  Recent Labs Lab 03/30/16 1040 03/31/16 0416  AST 85* 63*  ALT 54 42  ALKPHOS 119 104  BILITOT 2.1* 1.1  PROT 6.7 5.3*  ALBUMIN 3.8 3.0*   No results for input(s): LIPASE, AMYLASE in the last 168 hours. No results for input(s): AMMONIA in the last 168 hours. Coagulation Profile:  Recent Labs Lab 03/30/16 1040 03/31/16 0416  INR 1.09 1.10   Cardiac Enzymes:  Recent Labs Lab 03/30/16 1040 03/30/16 1350 03/30/16 1915 03/31/16 0128 03/31/16 0416  CKTOTAL 1,192*  --   --   --  650*  TROPONINI  --  0.08* 0.05* 0.04*  --    BNP (last 3 results) No results for input(s): PROBNP in the last 8760 hours. HbA1C: No results for input(s): HGBA1C in the last 72 hours. CBG:  Recent Labs Lab 03/31/16 1156  GLUCAP 110*   Lipid Profile: No results for input(s): CHOL, HDL, LDLCALC, TRIG, CHOLHDL, LDLDIRECT in the last 72  hours. Thyroid Function Tests: No results for input(s): TSH, T4TOTAL, FREET4, T3FREE, THYROIDAB in the last 72 hours. Anemia Panel: No results for input(s): VITAMINB12, FOLATE, FERRITIN, TIBC, IRON, RETICCTPCT in the last 72 hours.  Sepsis Labs:  Recent Labs Lab 03/30/16 1102 03/30/16 1309  LATICACIDVEN 2.73* 2.39*    Recent Results (from the past 240 hour(s))  Urine culture     Status: Abnormal   Collection Time: 03/30/16 12:53 PM  Result Value Ref Range Status   Specimen Description URINE, CLEAN CATCH  Final   Special Requests NONE  Final   Culture MULTIPLE SPECIES PRESENT, SUGGEST RECOLLECTION (A)  Final   Report Status 03/31/2016 FINAL  Final  Culture, blood (Routine X 2) w Reflex to ID Panel     Status: None (Preliminary result)   Collection Time: 03/30/16  3:14 PM  Result Value Ref Range Status   Specimen Description BLOOD LEFT ARM  Final   Special Requests BOTTLES DRAWN AEROBIC AND ANAEROBIC 5CC EACH  Final   Culture NO GROWTH < 24 HOURS  Final   Report Status PENDING  Incomplete  Culture, blood (Routine X 2) w Reflex to ID Panel     Status: None (Preliminary result)   Collection Time: 03/30/16  3:19 PM  Result Value Ref Range Status   Specimen Description BLOOD LEFT ARM  Final   Special Requests BOTTLES DRAWN AEROBIC AND ANAEROBIC 5CC EACH  Final   Culture NO GROWTH < 24 HOURS  Final   Report Status PENDING  Incomplete  MRSA PCR Screening     Status: None   Collection Time: 03/31/16  4:23 AM  Result Value Ref Range Status   MRSA by PCR NEGATIVE NEGATIVE Final    Comment:        The GeneXpert MRSA Assay (FDA approved for NASAL specimens only), is one component of a comprehensive MRSA colonization surveillance program. It is not intended to diagnose MRSA infection nor to guide or monitor treatment for MRSA infections.          Radiology Studies: Dg Chest 1 View  Result Date: 03/30/2016 CLINICAL DATA:  Fall. EXAM: CHEST 1 VIEW COMPARISON:  No prior.  FINDINGS: Mediastinum and hilar structures are normal. Lungs are clear of acute infiltrates. Tiny calcified nodules left upper lobe consistent with tiny granulomas. Heart size normal. No pleural effusion or pneumothorax. No acute bony abnormality. Degenerative changes thoracic spine . IMPRESSION: No acute abnormality. Electronically Signed   By: Marcello Moores  Register   On: 03/30/2016 11:34   Dg Elbow Complete Right  Result Date: 03/30/2016 CLINICAL DATA:  Fall. EXAM: RIGHT ELBOW - COMPLETE 3+ VIEW COMPARISON:  No recent prior. FINDINGS: No acute bony or joint abnormality identified. Small corticated densities noted adjacent to the radial epicondyle of the distal humerus most likely from prior injury. Diffuse degenerative change. IMPRESSION: No acute abnormality. Electronically Signed   By: Marcello Moores  Register   On: 03/30/2016 11:32   Dg Knee Complete 4 Views Left  Result Date: 03/30/2016 CLINICAL DATA:  75 year old male with a history of fall and left leg pain EXAM: LEFT KNEE - COMPLETE 4+ VIEW COMPARISON:  None. FINDINGS: No acute displaced fracture. Degenerative changes of the knee. No focal soft tissue swelling. Evaluation of joint effusion not optimized secondary to rotation on the lateral view. IMPRESSION: No evidence of acute displaced fracture at the knee. Signed, Dulcy Fanny. Earleen Newport, DO [facility] Radiology Electronically Signed   By: Corrie Mckusick D.O.   On: 03/30/2016 11:33   Dg Hip Unilat W Or Wo Pelvis 2-3 Views Left  Result Date: 03/30/2016 CLINICAL DATA:  Fall. EXAM: DG HIP (WITH OR WITHOUT PELVIS) 2-3V LEFT COMPARISON:  No recent prior. FINDINGS: Degenerative changes lumbar spine and both hips. Angulated fracture of the left femoral neck is noted. No other focal bony abnormalities identified. IMPRESSION: Angulated fracture of the left femoral neck. Electronically Signed   By: [place]   On: 03/30/2016 11:31        Scheduled Meds: .  atorvastatin  20 mg Oral Daily  . chlorhexidine  60 mL Topical Once  . escitalopram  10 mg Oral Daily  . ferrous sulfate  325 mg Oral Q breakfast  . metoprolol  200 mg Oral Daily  . pantoprazole  40 mg Oral Daily  . povidone-iodine  2 application Topical Once  . sodium chloride flush  3 mL Intravenous Q12H  .  traZODone  50 mg Oral QHS  . [START ON 04/01/2016] vancomycin  1,500 mg Intravenous Once   Continuous Infusions: . sodium chloride 100 mL/hr at 03/31/16 0909     LOS: 1 day       [facility], MD [facility] Pager [phone]  If 7PM-7AM, please contact night-coverage [email or web address] Password [facility] 03/31/2016, 4:27 PM

## 2016-03-31 NOTE — Progress Notes (Signed)
PT Cancellation Note  Patient Details Name: Darryl Diaz MRN: [id number] DOB: Mar 30, 1941   Cancelled Treatment:    Reason Eval/Treat Not Completed: Medical issues which prohibited therapy.  Patient awaiting surgical repair of Lt hip fx.  Will hold PT until post-op.  MD:  Please write PT orders post-op.  Thank you.   Despina Pole 03/31/2016, 7:25 AM Carita Pian. Sanjuana Kava, [place] Pager [phone]

## 2016-03-31 NOTE — Progress Notes (Signed)
Spoke with OR. Pt's surgery moved to tomorrow 11/24 at 10:15. Will update pt and continue to monitor

## 2016-04-01 ENCOUNTER — Encounter (HOSPITAL_COMMUNITY): Payer: Self-pay | Admitting: Certified Registered Nurse Anesthetist

## 2016-04-01 ENCOUNTER — Inpatient Hospital Stay (HOSPITAL_COMMUNITY): Payer: Medicare Other

## 2016-04-01 ENCOUNTER — Inpatient Hospital Stay (HOSPITAL_COMMUNITY): Payer: Medicare Other | Admitting: Anesthesiology

## 2016-04-01 ENCOUNTER — Encounter (HOSPITAL_COMMUNITY)
Admission: EM | Disposition: A | Discharge status: Skilled Nursing Facility | Payer: Self-pay | Source: Home / Self Care | Attending: Internal Medicine

## 2016-04-01 HISTORY — PX: HIP ARTHROPLASTY: SHX981

## 2016-04-01 LAB — CBC
HCT: 35.5 % — ABNORMAL LOW (ref 39.0–52.0)
Hemoglobin: 11.7 g/dL — ABNORMAL LOW (ref 13.0–17.0)
MCH: 31.8 pg (ref 26.0–34.0)
MCHC: 33 g/dL (ref 30.0–36.0)
MCV: 96.5 fL (ref 78.0–100.0)
Platelets: 107 10*3/uL — ABNORMAL LOW (ref 150–400)
RBC: 3.68 MIL/uL — ABNORMAL LOW (ref 4.22–5.81)
RDW: 14.5 % (ref 11.5–15.5)
WBC: 12 10*3/uL — ABNORMAL HIGH (ref 4.0–10.5)

## 2016-04-01 LAB — BASIC METABOLIC PANEL
Anion gap: 9 (ref 5–15)
BUN: 11 mg/dL (ref 6–20)
CO2: 24 mmol/L (ref 22–32)
Calcium: 8.4 mg/dL — ABNORMAL LOW (ref 8.9–10.3)
Chloride: 105 mmol/L (ref 101–111)
Creatinine, Ser: 1.14 mg/dL (ref 0.61–1.24)
GFR calc Af Amer: >60 mL/min (ref >60)
GFR calc non Af Amer: >60 mL/min (ref >60)
Glucose, Bld: 92 mg/dL (ref 65–99)
Potassium: 3.4 mmol/L — ABNORMAL LOW (ref 3.5–5.1)
Sodium: 138 mmol/L (ref 135–145)

## 2016-04-01 SURGERY — HEMIARTHROPLASTY, HIP, DIRECT ANTERIOR APPROACH, FOR FRACTURE
Anesthesia: General | Laterality: Left

## 2016-04-01 MED ORDER — METOCLOPRAMIDE HCL 5 MG PO TABS: 5.0000 mg | ORAL_TABLET | Freq: Three times a day (TID) | ORAL | Status: DC | PRN

## 2016-04-01 MED ORDER — DOCUSATE SODIUM 100 MG PO CAPS
100.0000 mg | ORAL_CAPSULE | Freq: Two times a day (BID) | ORAL | Status: DC
  Administered 2016-04-01 – 2016-04-04 (×6): 100 mg via ORAL
  Filled 2016-04-01 (×6): qty 1

## 2016-04-01 MED ORDER — METHOCARBAMOL 1000 MG/10ML IJ SOLN: 500.0000 mg | Freq: Four times a day (QID) | INTRAVENOUS | Status: DC | PRN

## 2016-04-01 MED ORDER — MENTHOL 3 MG MT LOZG: 1.0000 | LOZENGE | OROMUCOSAL | Status: DC | PRN

## 2016-04-01 MED ORDER — APIXABAN 5 MG PO TABS
5.0000 mg | ORAL_TABLET | Freq: Two times a day (BID) | ORAL | Status: DC
  Administered 2016-04-02 – 2016-04-04 (×5): 5 mg via ORAL
  Filled 2016-04-01 (×5): qty 1

## 2016-04-01 MED ORDER — METOCLOPRAMIDE HCL 5 MG/ML IJ SOLN: 5.0000 mg | Freq: Three times a day (TID) | INTRAMUSCULAR | Status: DC | PRN

## 2016-04-01 MED ORDER — FENTANYL CITRATE (PF) 100 MCG/2ML IJ SOLN
INTRAMUSCULAR | Status: AC
  Filled 2016-04-01: qty 2

## 2016-04-01 MED ORDER — VANCOMYCIN HCL IN DEXTROSE 1-5 GM/200ML-% IV SOLN
1000.0000 mg | Freq: Once | INTRAVENOUS | Status: AC
  Administered 2016-04-02: 1000 mg via INTRAVENOUS
  Filled 2016-04-01: qty 200

## 2016-04-01 MED ORDER — ONDANSETRON HCL 4 MG PO TABS: 4.0000 mg | ORAL_TABLET | Freq: Four times a day (QID) | ORAL | Status: DC | PRN

## 2016-04-01 MED ORDER — SUGAMMADEX SODIUM 200 MG/2ML IV SOLN
INTRAVENOUS | Status: DC | PRN
  Administered 2016-04-01: 250 mg via INTRAVENOUS

## 2016-04-01 MED ORDER — ADULT MULTIVITAMIN W/MINERALS CH
1.0000 | ORAL_TABLET | Freq: Every day | ORAL | Status: DC
  Administered 2016-04-02 – 2016-04-04 (×3): 1 via ORAL
  Filled 2016-04-01 (×3): qty 1

## 2016-04-01 MED ORDER — ALUM & MAG HYDROXIDE-SIMETH 200-200-20 MG/5ML PO SUSP: 30.0000 mL | ORAL | Status: DC | PRN

## 2016-04-01 MED ORDER — FOLIC ACID 1 MG PO TABS
1.0000 mg | ORAL_TABLET | Freq: Every day | ORAL | Status: DC
  Administered 2016-04-01 – 2016-04-04 (×4): 1 mg via ORAL
  Filled 2016-04-01 (×4): qty 1

## 2016-04-01 MED ORDER — METOPROLOL SUCCINATE ER 100 MG PO TB24
200.0000 mg | ORAL_TABLET | Freq: Every day | ORAL | Status: DC
  Administered 2016-04-02 – 2016-04-04 (×3): 200 mg via ORAL
  Filled 2016-04-01 (×3): qty 2

## 2016-04-01 MED ORDER — PHENYLEPHRINE HCL 10 MG/ML IJ SOLN
INTRAVENOUS | Status: DC | PRN
  Administered 2016-04-01: 25 ug/min via INTRAVENOUS

## 2016-04-01 MED ORDER — FERROUS SULFATE 325 (65 FE) MG PO TABS
325.0000 mg | ORAL_TABLET | Freq: Three times a day (TID) | ORAL | Status: DC
  Administered 2016-04-01 – 2016-04-04 (×8): 325 mg via ORAL
  Filled 2016-04-01 (×5): qty 1

## 2016-04-01 MED ORDER — METHOCARBAMOL 500 MG PO TABS: 500.0000 mg | ORAL_TABLET | Freq: Four times a day (QID) | ORAL | Status: DC | PRN

## 2016-04-01 MED ORDER — ACETAMINOPHEN 325 MG PO TABS: 650.0000 mg | ORAL_TABLET | Freq: Four times a day (QID) | ORAL | Status: DC | PRN

## 2016-04-01 MED ORDER — CEFAZOLIN IN D5W 1 GM/50ML IV SOLN: 1.0000 g | Freq: Four times a day (QID) | INTRAVENOUS | Status: DC

## 2016-04-01 MED ORDER — ONDANSETRON HCL 4 MG/2ML IJ SOLN
INTRAMUSCULAR | Status: DC | PRN
  Administered 2016-04-01: 4 mg via INTRAVENOUS

## 2016-04-01 MED ORDER — LACTATED RINGERS IV SOLN
INTRAVENOUS | Status: DC
  Administered 2016-04-01: 11:00:00 via INTRAVENOUS

## 2016-04-01 MED ORDER — MORPHINE SULFATE (PF) 2 MG/ML IV SOLN: 0.5000 mg | INTRAVENOUS | Status: DC | PRN

## 2016-04-01 MED ORDER — ACETAMINOPHEN 650 MG RE SUPP: 650.0000 mg | Freq: Four times a day (QID) | RECTAL | Status: DC | PRN

## 2016-04-01 MED ORDER — B COMPLEX-C PO TABS
1.0000 | ORAL_TABLET | Freq: Every day | ORAL | Status: DC
  Administered 2016-04-02 – 2016-04-04 (×3): 1 via ORAL
  Filled 2016-04-01 (×3): qty 1

## 2016-04-01 MED ORDER — ONDANSETRON HCL 4 MG/2ML IJ SOLN: 4.0000 mg | Freq: Four times a day (QID) | INTRAMUSCULAR | Status: DC | PRN

## 2016-04-01 MED ORDER — SODIUM CHLORIDE 0.9 % IV SOLN
30.0000 meq | Freq: Once | INTRAVENOUS | Status: AC
  Administered 2016-04-01: 30 meq via INTRAVENOUS
  Filled 2016-04-01: qty 15

## 2016-04-01 MED ORDER — METOPROLOL SUCCINATE ER 100 MG PO TB24: 200.0000 mg | ORAL_TABLET | Freq: Every day | ORAL | Status: DC

## 2016-04-01 MED ORDER — HYDROCODONE-ACETAMINOPHEN 5-325 MG PO TABS: 1.0000 | ORAL_TABLET | Freq: Four times a day (QID) | ORAL | Status: DC | PRN

## 2016-04-01 MED ORDER — DEXAMETHASONE SODIUM PHOSPHATE 10 MG/ML IJ SOLN
INTRAMUSCULAR | Status: DC | PRN
  Administered 2016-04-01: 8 mg via INTRAVENOUS

## 2016-04-01 MED ORDER — HYDROMORPHONE HCL 1 MG/ML IJ SOLN: 0.2500 mg | INTRAMUSCULAR | Status: DC | PRN

## 2016-04-01 MED ORDER — ACETAMINOPHEN 325 MG PO TABS
650.0000 mg | ORAL_TABLET | Freq: Four times a day (QID) | ORAL | Status: DC | PRN
  Administered 2016-04-01 – 2016-04-02 (×2): 650 mg via ORAL
  Filled 2016-04-01 (×2): qty 2

## 2016-04-01 MED ORDER — PROMETHAZINE HCL 25 MG/ML IJ SOLN: 6.2500 mg | INTRAMUSCULAR | Status: DC | PRN

## 2016-04-01 MED ORDER — PROPOFOL 10 MG/ML IV BOLUS
INTRAVENOUS | Status: DC | PRN
  Administered 2016-04-01: 150 mg via INTRAVENOUS

## 2016-04-01 MED ORDER — SODIUM CHLORIDE 0.9 % IV SOLN
INTRAVENOUS | Status: DC
  Administered 2016-04-01: 16:00:00 via INTRAVENOUS

## 2016-04-01 MED ORDER — METOPROLOL SUCCINATE ER 100 MG PO TB24
100.0000 mg | ORAL_TABLET | Freq: Every day | ORAL | Status: DC
  Filled 2016-04-01: qty 1

## 2016-04-01 MED ORDER — ROCURONIUM BROMIDE 100 MG/10ML IV SOLN
INTRAVENOUS | Status: DC | PRN
  Administered 2016-04-01: 60 mg via INTRAVENOUS

## 2016-04-01 MED ORDER — LORATADINE 10 MG PO TABS
10.0000 mg | ORAL_TABLET | Freq: Every day | ORAL | Status: DC
  Administered 2016-04-01 – 2016-04-04 (×4): 10 mg via ORAL
  Filled 2016-04-01 (×4): qty 1

## 2016-04-01 MED ORDER — LIDOCAINE HCL (CARDIAC) 20 MG/ML IV SOLN
INTRAVENOUS | Status: DC | PRN
  Administered 2016-04-01: 80 mg via INTRAVENOUS

## 2016-04-01 MED ORDER — PHENOL 1.4 % MT LIQD: 1.0000 | OROMUCOSAL | Status: DC | PRN

## 2016-04-01 MED ORDER — 0.9 % SODIUM CHLORIDE (POUR BTL) OPTIME
TOPICAL | Status: DC | PRN
  Administered 2016-04-01: 1000 mL

## 2016-04-01 MED ORDER — POLYETHYLENE GLYCOL 3350 17 G PO PACK: 17.0000 g | PACK | Freq: Every day | ORAL | Status: DC | PRN

## 2016-04-01 MED ORDER — EPHEDRINE SULFATE 50 MG/ML IJ SOLN
INTRAMUSCULAR | Status: DC | PRN
  Administered 2016-04-01: 10 mg via INTRAVENOUS

## 2016-04-01 MED ORDER — FENTANYL CITRATE (PF) 100 MCG/2ML IJ SOLN
INTRAMUSCULAR | Status: DC | PRN
  Administered 2016-04-01: 100 ug via INTRAVENOUS

## 2016-04-01 SURGICAL SUPPLY — 49 items
BLADE SAW SGTL 18X1.27X75 (BLADE) ×2 IMPLANT
CAPT HIP HEMI 2 ×2 IMPLANT
COVER SURGICAL LIGHT HANDLE (MISCELLANEOUS) ×2 IMPLANT
DERMABOND ADVANCED (GAUZE/BANDAGES/DRESSINGS) ×1
DERMABOND ADVANCED .7 DNX12 (GAUZE/BANDAGES/DRESSINGS) ×1 IMPLANT
DRAPE IMP U-DRAPE 54X76 (DRAPES) ×2 IMPLANT
DRAPE INCISE IOBAN 85X60 (DRAPES) ×2 IMPLANT
DRAPE ORTHO SPLIT 77X108 STRL (DRAPES) ×2
DRAPE SURG ORHT 6 SPLT 77X108 (DRAPES) ×2 IMPLANT
DRAPE U-SHAPE 47X51 STRL (DRAPES) ×2 IMPLANT
DRSG AQUACEL AG ADV 3.5X10 (GAUZE/BANDAGES/DRESSINGS) ×2 IMPLANT
DURAPREP 26ML APPLICATOR (WOUND CARE) ×4 IMPLANT
ELECT BLADE 4.0 EZ CLEAN MEGAD (MISCELLANEOUS) ×2
ELECT REM PT RETURN 9FT ADLT (ELECTROSURGICAL) ×2
ELECTRODE BLDE 4.0 EZ CLN MEGD (MISCELLANEOUS) ×1 IMPLANT
ELECTRODE REM PT RTRN 9FT ADLT (ELECTROSURGICAL) ×1 IMPLANT
EVACUATOR 1/8 PVC DRAIN (DRAIN) IMPLANT
FACESHIELD WRAPAROUND (MASK) ×4 IMPLANT
GLOVE BIOGEL PI IND STRL 7.5 (GLOVE) ×1 IMPLANT
GLOVE BIOGEL PI IND STRL 8.5 (GLOVE) ×2 IMPLANT
GLOVE BIOGEL PI INDICATOR 7.5 (GLOVE) ×1
GLOVE BIOGEL PI INDICATOR 8.5 (GLOVE) ×2
GLOVE ECLIPSE 8.0 STRL XLNG CF (GLOVE) ×2 IMPLANT
GLOVE ORTHO TXT STRL SZ7.5 (GLOVE) ×2 IMPLANT
GOWN STRL REUS W/ TWL LRG LVL3 (GOWN DISPOSABLE) ×3 IMPLANT
GOWN STRL REUS W/TWL 2XL LVL3 (GOWN DISPOSABLE) ×2 IMPLANT
GOWN STRL REUS W/TWL LRG LVL3 (GOWN DISPOSABLE) ×3
HANDPIECE INTERPULSE COAX TIP (DISPOSABLE)
IMMOBILIZER KNEE 22 (SOFTGOODS) ×2 IMPLANT
IMMOBILIZER KNEE 22 UNIV (SOFTGOODS) ×2 IMPLANT
KIT BASIN OR (CUSTOM PROCEDURE TRAY) ×2 IMPLANT
KIT ROOM TURNOVER OR (KITS) ×2 IMPLANT
MANIFOLD NEPTUNE II (INSTRUMENTS) ×2 IMPLANT
NS IRRIG 1000ML POUR BTL (IV SOLUTION) ×2 IMPLANT
PACK TOTAL JOINT (CUSTOM PROCEDURE TRAY) ×2 IMPLANT
PACK UNIVERSAL I (CUSTOM PROCEDURE TRAY) ×2 IMPLANT
PAD ARMBOARD 7.5X6 YLW CONV (MISCELLANEOUS) ×4 IMPLANT
SET HNDPC FAN SPRY TIP SCT (DISPOSABLE) IMPLANT
SPONGE LAP 4X18 X RAY DECT (DISPOSABLE) ×4 IMPLANT
SUT MNCRL AB 4-0 PS2 18 (SUTURE) IMPLANT
SUT VIC AB 1 CT1 27 (SUTURE) ×2
SUT VIC AB 1 CT1 27XBRD ANBCTR (SUTURE) ×2 IMPLANT
SUT VIC AB 2-0 CT1 27 (SUTURE)
SUT VIC AB 2-0 CT1 TAPERPNT 27 (SUTURE) IMPLANT
SUT VLOC 180 0 24IN GS25 (SUTURE) ×2 IMPLANT
TOWEL OR 17X24 6PK STRL BLUE (TOWEL DISPOSABLE) ×2 IMPLANT
TOWEL OR 17X26 10 PK STRL BLUE (TOWEL DISPOSABLE) ×2 IMPLANT
TRAY FOLEY CATH 14FR (SET/KITS/TRAYS/PACK) IMPLANT
WATER STERILE IRR 1000ML POUR (IV SOLUTION) IMPLANT

## 2016-04-01 NOTE — Progress Notes (Signed)
Report called to OR, Lucina Mellow.  RN made aware IV potassium is currently infusing.  Metoprolol scheduled for 10am needs to be given, Short Stay to give.  Pre-procedure checklist completed.  CHG x 2 complete.  PCR negative.  CCMD made aware telemetry monitoring to be placed on hold, patient on way to OR.

## 2016-04-01 NOTE — Anesthesia Preprocedure Evaluation (Signed)
Anesthesia Evaluation  Patient identified by MRN, date of birth, ID band Patient awake    Reviewed: Allergy & Precautions, NPO status , Patient's Chart, lab work & pertinent test results  Airway Mallampati: II  TM Distance: >3 FB Neck ROM: Full    Dental no notable dental hx.    Pulmonary neg pulmonary ROS, former smoker,    Pulmonary exam normal breath sounds clear to auscultation       Cardiovascular hypertension, Normal cardiovascular exam+ dysrhythmias Atrial Fibrillation  Rhythm:Regular Rate:Normal     Neuro/Psych negative neurological ROS  negative psych ROS   GI/Hepatic negative GI ROS, (+)     substance abuse  alcohol use,   Endo/Other  negative endocrine ROS  Renal/GU negative Renal ROS  negative genitourinary   Musculoskeletal negative musculoskeletal ROS (+)   Abdominal   Peds negative pediatric ROS (+)  Hematology negative hematology ROS (+)   Anesthesia Other Findings   Reproductive/Obstetrics negative OB ROS                             Anesthesia Physical Anesthesia Plan  ASA: III  Anesthesia Plan: General   Post-op Pain Management:    Induction: Intravenous  Airway Management Planned: Oral ETT  Additional Equipment:   Intra-op Plan:   Post-operative Plan: Extubation in OR  Informed Consent: I have reviewed the patients History and Physical, chart, labs and discussed the procedure including the risks, benefits and alternatives for the proposed anesthesia with the patient or authorized representative who has indicated his/her understanding and acceptance.   Dental advisory given  Plan Discussed with: CRNA and Surgeon  Anesthesia Plan Comments:         Anesthesia Quick Evaluation

## 2016-04-01 NOTE — Progress Notes (Addendum)
PROGRESS NOTE  Darryl Diaz  VPX:[id number] DOB: [birth year]  DOA: 03/30/2016 PCP: [name], MD   Brief Narrative:  75 year old male with history of HLD, HTN, MDD, GERD, atrial fibrillation presented to ED after he was found by his family on the floor where he had been for about 2 days. He apparently had diarrhea and got up quickly go to the bathroom sustaining a fall onto his left hip. No history of LOC or head injury. He apparently stayed in the same position in his stools for extended period of time due to inability to move until his family found him. Imaging confirmed angulated fracture of left femoral neck. Orthopedics consulted and he underwent left hip hemiarthroplasty on 04/01/16.   Assessment & Plan:   Active Problems:   Hip fracture (HCC)   Hypertension   Hyperlipidemia   GERD (gastroesophageal reflux disease)   Osteoarthritis   Depression   Rhabdomyolysis   Leukocytosis   Pressure injury of skin   Closed fracture of left hip (HCC)   1. Displaced left femoral neck fracture, status post left hip hemiarthroplasty 04/01/16: Sustained status post mechanical fall. Orthopedics was consulted and after preoperative clearance, underwent left hip hemiarthroplasty on 04/01/16. Management per orthopedics. Eliquis being started this evening. 2. Acute kidney injury: Likely related to dehydration from poor oral intake. Resolved after IV fluid hydration. 3. Mild rhabdomyolysis: Presented with CK of 1192. This is improved to 650 after IV fluids. Continue IV fluids and follow CK in a.m. Temporarily hold statins. 4. Essential hypertension: Mildly uncontrolled at times. Continue metoprolol. 5. Hyperlipidemia: Hold statins due to rhabdomyolysis. 6. GERD: Continue PPI. 7. Paroxysmal Atrial fibrillation: EKG 03/30/16 confirms sinus rhythm without acute changes. Continue metoprolol. Eliquis was held for surgery and being resumed postop on 11/24 evening. Patient was seen by his primary  cardiologist Dr. Burt Knack on 12/28/15. He is [place] records from [place] of state (patient recently moved 4 months ago from [place]). CHADS-Vasc = 2 8. Diarrhea: Seems to have resolved. 9. Anemia: Not sure if he has chronic anemia. Initial hemoglobin of 14.6 may be falsely high due to hemoconcentration from dehydration. Stable. Follow CBC in a.m. 10. Thrombocytopenia: Stable. Follow CBC in a.m. 11. Elevated troponin: Most likely secondary to rhabdomyolysis. EKG without acute changes. Decreasing trend. No chest pain reported. 12. Hypokalemia: Replace and follow.   DVT prophylaxis: SCD's Code Status: Full Family Communication: None at bedside. Patient declined my offer to discuss with his family. Disposition Plan: to be determined post op.   Consultants:   Orthopedics  Procedures:   Left hip hemiarthroplasty 04/01/16  Antimicrobials:   None    Subjective: Patient was seen preoperatively and denied any complaints. No chest pain or dyspnea reported.  Objective:  Vitals:   03/31/16 0451 03/31/16 1500 03/31/16 2129 04/01/16 0633  BP: 138/74 (!) 129/54 138/71 (!) 156/77  Pulse: 69 84 76 77  Resp: 16 18 18 18   Temp: 98.9 F (37.2 C) 98.5 F (36.9 C) 98.4 F (36.9 C) 99.6 F (37.6 C)  TempSrc: Oral Oral Oral Oral  SpO2: 94% 95% 95% 95%  Weight:      Height:        Intake/Output Summary (Last 24 hours) at 04/01/16 0714 Last data filed at 04/01/16 4627  Gross per 24 hour  Intake          1958.33 ml  Output              475 ml  Net  1483.33 ml   Filed Weights   03/30/16 1006  Weight: 106.6 kg (235 lb)    Examination:  General exam: Pleasant elderly male lying comfortably propped up in bed. Respiratory system: Clear to auscultation. Respiratory effort normal. Cardiovascular system: S1 & S2 heard, RRR. No JVD, murmurs, rubs, gallops or clicks. No pedal edema.Telemetry: Sinus rhythm. Gastrointestinal system: Abdomen is nondistended, soft and nontender. No organomegaly  or masses felt. Normal bowel sounds heard. Central nervous system: Alert and oriented. No focal neurological deficits. Extremities: Symmetric 5 x 5 power. Left lower extremity shortened and externally rotated and movements limited secondary to pain. Multiple superficial abrasions on bilateral legs. Skin: No rashes, lesions or ulcers Psychiatry: Judgement and insight appear normal. Mood & affect appropriate.     Data Reviewed: I have personally reviewed following labs and imaging studies  CBC:  Recent Labs Lab 03/30/16 1040 03/31/16 0416 04/01/16 0350  WBC 19.6* 12.4* 12.0*  NEUTROABS 16.9*  --   --   HGB 14.6 11.7* 11.7*  HCT 41.8 34.6* 35.5*  MCV 93.3 95.8 96.5  PLT 161 119* 932*   Basic Metabolic Panel:  Recent Labs Lab 03/30/16 1040 03/31/16 0416 04/01/16 0350  NA 139 138 138  K 3.7 3.5 3.4*  CL 102 106 105  CO2 22 24 24   GLUCOSE 162* 109* 92  BUN 14 13 11   CREATININE 1.51* 1.16 1.14  CALCIUM 9.1 8.2* 8.4*   GFR: Estimated Creatinine Clearance: 71.7 mL/min (by C-G formula based on SCr of 1.14 mg/dL). Liver Function Tests:  Recent Labs Lab 03/30/16 1040 03/31/16 0416  AST 85* 63*  ALT 54 42  ALKPHOS 119 104  BILITOT 2.1* 1.1  PROT 6.7 5.3*  ALBUMIN 3.8 3.0*   No results for input(s): LIPASE, AMYLASE in the last 168 hours. No results for input(s): AMMONIA in the last 168 hours. Coagulation Profile:  Recent Labs Lab 03/30/16 1040 03/31/16 0416  INR 1.09 1.10   Cardiac Enzymes:  Recent Labs Lab 03/30/16 1040 03/30/16 1350 03/30/16 1915 03/31/16 0128 03/31/16 0416  CKTOTAL 1,192*  --   --   --  650*  TROPONINI  --  0.08* 0.05* 0.04*  --    BNP (last 3 results) No results for input(s): PROBNP in the last 8760 hours. HbA1C: No results for input(s): HGBA1C in the last 72 hours. CBG:  Recent Labs Lab 03/31/16 1156  GLUCAP 110*   Lipid Profile: No results for input(s): CHOL, HDL, LDLCALC, TRIG, CHOLHDL, LDLDIRECT in the last 72  hours. Thyroid Function Tests: No results for input(s): TSH, T4TOTAL, FREET4, T3FREE, THYROIDAB in the last 72 hours. Anemia Panel: No results for input(s): VITAMINB12, FOLATE, FERRITIN, TIBC, IRON, RETICCTPCT in the last 72 hours.  Sepsis Labs:  Recent Labs Lab 03/30/16 1102 03/30/16 1309  LATICACIDVEN 2.73* 2.39*    Recent Results (from the past 240 hour(s))  Urine culture     Status: Abnormal   Collection Time: 03/30/16 12:53 PM  Result Value Ref Range Status   Specimen Description URINE, CLEAN CATCH  Final   Special Requests NONE  Final   Culture MULTIPLE SPECIES PRESENT, SUGGEST RECOLLECTION (A)  Final   Report Status 03/31/2016 FINAL  Final  Culture, blood (Routine X 2) w Reflex to ID Panel     Status: None (Preliminary result)   Collection Time: 03/30/16  3:14 PM  Result Value Ref Range Status   Specimen Description BLOOD LEFT ARM  Final   Special Requests BOTTLES DRAWN AEROBIC AND ANAEROBIC 5CC  EACH  Final   Culture NO GROWTH < 24 HOURS  Final   Report Status PENDING  Incomplete  Culture, blood (Routine X 2) w Reflex to ID Panel     Status: None (Preliminary result)   Collection Time: 03/30/16  3:19 PM  Result Value Ref Range Status   Specimen Description BLOOD LEFT ARM  Final   Special Requests BOTTLES DRAWN AEROBIC AND ANAEROBIC 5CC EACH  Final   Culture NO GROWTH < 24 HOURS  Final   Report Status PENDING  Incomplete  MRSA PCR Screening     Status: None   Collection Time: 03/31/16  4:23 AM  Result Value Ref Range Status   MRSA by PCR NEGATIVE NEGATIVE Final    Comment:        The GeneXpert MRSA Assay (FDA approved for NASAL specimens only), is one component of a comprehensive MRSA colonization surveillance program. It is not intended to diagnose MRSA infection nor to guide or monitor treatment for MRSA infections.          Radiology Studies: Dg Chest 1 View  Result Date: 03/30/2016 CLINICAL DATA:  Fall. EXAM: CHEST 1 VIEW COMPARISON:  No prior.  FINDINGS: Mediastinum and hilar structures are normal. Lungs are clear of acute infiltrates. Tiny calcified nodules left upper lobe consistent with tiny granulomas. Heart size normal. No pleural effusion or pneumothorax. No acute bony abnormality. Degenerative changes thoracic spine . IMPRESSION: No acute abnormality. Electronically Signed   By: Marcello Moores  Register   On: 03/30/2016 11:34   Dg Elbow Complete Right  Result Date: 03/30/2016 CLINICAL DATA:  Fall. EXAM: RIGHT ELBOW - COMPLETE 3+ VIEW COMPARISON:  No recent prior. FINDINGS: No acute bony or joint abnormality identified. Small corticated densities noted adjacent to the radial epicondyle of the distal humerus most likely from prior injury. Diffuse degenerative change. IMPRESSION: No acute abnormality. Electronically Signed   By: Marcello Moores  Register   On: 03/30/2016 11:32   Dg Knee Complete 4 Views Left  Result Date: 03/30/2016 CLINICAL DATA:  75 year old male with a history of fall and left leg pain EXAM: LEFT KNEE - COMPLETE 4+ VIEW COMPARISON:  None. FINDINGS: No acute displaced fracture. Degenerative changes of the knee. No focal soft tissue swelling. Evaluation of joint effusion not optimized secondary to rotation on the lateral view. IMPRESSION: No evidence of acute displaced fracture at the knee. Signed, Dulcy Fanny. Earleen Newport, DO [facility] Radiology Electronically Signed   By: Corrie Mckusick D.O.   On: 03/30/2016 11:33   Dg Hip Unilat W Or Wo Pelvis 2-3 Views Left  Result Date: 03/30/2016 CLINICAL DATA:  Fall. EXAM: DG HIP (WITH OR WITHOUT PELVIS) 2-3V LEFT COMPARISON:  No recent prior. FINDINGS: Degenerative changes lumbar spine and both hips. Angulated fracture of the left femoral neck is noted. No other focal bony abnormalities identified. IMPRESSION: Angulated fracture of the left femoral neck. Electronically Signed   By: [place]   On: 03/30/2016 11:31        Scheduled Meds: .  chlorhexidine  60 mL Topical Once  . escitalopram  10 mg Oral Daily  . ferrous sulfate  325 mg Oral Q breakfast  . metoprolol  200 mg Oral Daily  . pantoprazole  40 mg Oral Daily  . potassium chloride (KCL MULTIRUN) 30 mEq in 265 mL IVPB  30 mEq Intravenous Once  . povidone-iodine  2 application Topical Once  . sodium chloride flush  3 mL Intravenous Q12H  . traZODone  50 mg Oral QHS  . vancomycin  1,500 mg Intravenous Once   Continuous Infusions: . sodium chloride 100 mL/hr at 03/31/16 2058     LOS: 2 days       [facility], MD [facility] Pager [phone]  If 7PM-7AM, please contact night-coverage [email or web address] Password [email or web address] 04/01/2016, 7:14 AM

## 2016-04-01 NOTE — Anesthesia Postprocedure Evaluation (Signed)
Anesthesia Post Note  Patient: Darryl Diaz  Procedure(s) Performed: Procedure(s) (LRB): ARTHROPLASTY BIPOLAR HIP (HEMIARTHROPLASTY) (Left)  Patient location during evaluation: PACU Anesthesia Type: General Level of consciousness: awake and alert Pain management: pain level controlled Vital Signs Assessment: post-procedure vital signs reviewed and stable Respiratory status: spontaneous breathing, nonlabored ventilation, respiratory function stable and patient connected to nasal cannula oxygen Cardiovascular status: blood pressure returned to baseline and stable Postop Assessment: no signs of nausea or vomiting Anesthetic complications: no    Last Vitals:  Vitals:   04/01/16 0633 04/01/16 1300  BP: (!) 156/77 (P) 136/83  Pulse: 77   Resp: 18   Temp: 37.6 C (P) 36.9 C    Last Pain:  Vitals:   04/01/16 0900  TempSrc:   PainSc: 0-No pain                 Jamesa Tedrick S

## 2016-04-01 NOTE — Progress Notes (Signed)
Patient ID: Darryl Diaz, male   DOB: 11-10-1940, 75 y.o.   MRN: [id number]  To OR today for left hip hemiarthroplasty NPO  Will be weight bearing as tolerated Will contact family after procedure Restart Eliquis post op

## 2016-04-01 NOTE — Transfer of Care (Signed)
Immediate Anesthesia Transfer of Care Note  Patient: Darryl Diaz  Procedure(s) Performed: Procedure(s): ARTHROPLASTY BIPOLAR HIP (HEMIARTHROPLASTY) (Left)  Patient Location: PACU  Anesthesia Type:General  Level of Consciousness: awake and patient cooperative  Airway & Oxygen Therapy: Patient Spontanous Breathing and Patient connected to nasal cannula oxygen  Post-op Assessment: Report given to RN and Post -op Vital signs reviewed and stable  Post vital signs: Reviewed and stable  Last Vitals:  Vitals:   03/31/16 2129 04/01/16 0633  BP: 138/71 (!) 156/77  Pulse: 76 77  Resp: 18 18  Temp: 36.9 C 37.6 C    Last Pain:  Vitals:   04/01/16 0900  TempSrc:   PainSc: 0-No pain         Complications: No apparent anesthesia complications

## 2016-04-01 NOTE — Progress Notes (Addendum)
Dr. Rodell Perna, anesthesiologist in the operating room wants patient to have 100mg  toprol po now.   Then may resume usual schedule and dose.   Spoke with Stanton Kidney on 5N and informed her of todays decrease dose of toprol for today.  DA

## 2016-04-01 NOTE — Progress Notes (Signed)
Medication consult:  Clarified with Dr. Alvan Dame and ok to resume apixaban in AM. He is also ok to use one dose of vanc post op instead of Ancef.   Onnie Boer, PharmD, BCPS, AAHIVP, CPP Infectious Disease Pharmacist Pager: [phone] 04/01/2016 4:46 PM

## 2016-04-01 NOTE — Op Note (Signed)
NAME:  Darryl Diaz, Darryl Diaz NO.:  [id number]   MEDICAL RECORD NO.: [id number]   LOCATION:  1245                         FACILITY:  [facility]   DATE OF BIRTH:  December 12, 2040  PHYSICIAN:  Pietro Cassis. Alvan Dame, M.D.     DATE OF PROCEDURE:  04/01/16                               OPERATIVE REPORT     PREOPERATIVE DIAGNOSIS:  Left displaced femoral neck fracture.   POSTOPERATIVE DIAGNOSIS:  Left displaced femoral neck fracture.   PROCEDURE:  Left hip hemiarthroplasty utilizing DePuy component, size 6 standard Tri-Lock stem with a 25mm unipolar ball with a +5 adapter.   SURGEON:  Pietro Cassis. Alvan Dame, MD   ASSISTANT:  Ky Barban, RNFA   ANESTHESIA:  General.   SPECIMENS:  None.   DRAINS:  None   BLOOD LOSS:  About 250 cc.   COMPLICATIONS:  None.   INDICATION OF PROCEDURE:  Darryl Diaz is a pleasant 75 year old male who lives independently.  He unfortunately had a fall at his house.  He was found at his house after 2 days of being on the floor by family. He was admitted to the hospital after radiographs revealed a femoral neck fracture.  He was admitted to medicine and optimized for surgery with regards to dehydration and or some rhabdomyolysis. He was seen and evaluated and was scheduled for surgery for fixation.  The necessity of surgical repair was discussed with she and her family.  Consent was obtained after reviewing risks of infection, DVT, component failure, and need for revision surgery.   PROCEDURE IN DETAIL:  The patient was brought to the operative theater. Once adequate anesthesia, preoperative antibiotics, 2 g of Ancef administered, the patient was positioned into the right lateral decubitus position with the left side up.  The left lower extremity was then prepped and draped in sterile fashion.  A time-out was performed identifying the patient, planned procedure, and extremity.   A lateral incision was made off the proximal trochanter. Sharp dissection was  carried down to the iliotibial band and gluteal fascia. The gluteal fascia was then incised for posterior approach.  The short external rotators were taken down separate from the posterior capsule. An L capsulotomy was made preserving the posterior leaflet for later anatomic repair. Fracture site was identified and after removing comminuted segments of the posterior femoral neck, the femoral head was removed without difficulty and measured on the back table  using the sizing rings and determined to be 53 mm in diameter.   The proximal femur was then exposed.  Retractors placed.  I then drilled, opened the proximal femur.  Then I hand reamed once and  Irrigated the canal to try to prevent fat emboli.  I began broaching the femur with a starting broach up to a size 6 broach with good medial and lateral metaphyseal fit without evidence of any torsion or movement.  A trial reduction was carried out with a standard offset neck and a +0 then +5 adapter with the 15mm ball.  The hip reduced nicely.  The leg lengths appeared to be equal compared to the down leg.   The hip went through a range of motion  without evidence of any subluxation or impingement.   Given these findings, the trial components removed.  The final 6   Tri-Lock stem was opened.  After irrigating the canal, the final stem was impacted and sat at the level where the broach was. Based on this and the trial reduction, a +5 adapter was opened and impacted in the 53 mm unipolar ball onto a clean and dry trunnion.  The hip had been irrigated throughout the case and again at this point.  I re- Approximated the posterior capsule to the superior leaflet using a  #1 Vicryl.  The remainder of the wound was closed with #1 Vicryl in the iliotibial band and gluteal fascia, a  2-0 Vicryl in the sub-Q tissue and a running 4-0 Monocryl in the skin.  The hip was cleaned, dried, and dressed sterilely using Dermabond and Aquacel dressing.  He was  then brought to recovery room, extubated in stable condition, tolerating the procedure well.   Ky Barban, RNFA assisted inall aspects of surgical care including positioning, draping, management of the operative extremity, and primary wound closure       Pietro Cassis. Alvan Dame, M.D.

## 2016-04-01 NOTE — Anesthesia Procedure Notes (Signed)
Procedure Name: Intubation Date/Time: 04/01/2016 11:28 AM Performed by: Oletta Lamas Pre-anesthesia Checklist: Patient identified, Emergency Drugs available, Suction available and Patient being monitored Patient Re-evaluated:Patient Re-evaluated prior to inductionOxygen Delivery Method: Circle System Utilized Preoxygenation: Pre-oxygenation with 100% oxygen Intubation Type: IV induction Ventilation: Mask ventilation without difficulty Laryngoscope Size: Mac Grade View: Grade I Tube type: Oral Tube size: 7.5 mm Number of attempts: 1 Airway Equipment and Method: Stylet Placement Confirmation: ETT inserted through vocal cords under direct vision,  positive ETCO2 and breath sounds checked- equal and bilateral Secured at: 23 cm Tube secured with: Tape Dental Injury: Teeth and Oropharynx as per pre-operative assessment

## 2016-04-02 DIAGNOSIS — D62 Acute posthemorrhagic anemia: Secondary | ICD-10-CM

## 2016-04-02 LAB — CBC
HCT: 30 % — ABNORMAL LOW (ref 39.0–52.0)
Hemoglobin: 10.4 g/dL — ABNORMAL LOW (ref 13.0–17.0)
MCH: 32.4 pg (ref 26.0–34.0)
MCHC: 34.7 g/dL (ref 30.0–36.0)
MCV: 93.5 fL (ref 78.0–100.0)
Platelets: 121 10*3/uL — ABNORMAL LOW (ref 150–400)
RBC: 3.21 MIL/uL — ABNORMAL LOW (ref 4.22–5.81)
RDW: 13.9 % (ref 11.5–15.5)
WBC: 12 10*3/uL — ABNORMAL HIGH (ref 4.0–10.5)

## 2016-04-02 LAB — BASIC METABOLIC PANEL
Anion gap: 9 (ref 5–15)
BUN: 12 mg/dL (ref 6–20)
CO2: 23 mmol/L (ref 22–32)
Calcium: 8.1 mg/dL — ABNORMAL LOW (ref 8.9–10.3)
Chloride: 104 mmol/L (ref 101–111)
Creatinine, Ser: 1.01 mg/dL (ref 0.61–1.24)
GFR calc Af Amer: >60 mL/min (ref >60)
GFR calc non Af Amer: >60 mL/min (ref >60)
Glucose, Bld: 123 mg/dL — ABNORMAL HIGH (ref 65–99)
Potassium: 3.3 mmol/L — ABNORMAL LOW (ref 3.5–5.1)
Sodium: 136 mmol/L (ref 135–145)

## 2016-04-02 LAB — CK: Total CK: 308 U/L (ref 49–397)

## 2016-04-02 MED ORDER — POTASSIUM CHLORIDE CRYS ER 20 MEQ PO TBCR
40.0000 meq | EXTENDED_RELEASE_TABLET | Freq: Once | ORAL | Status: AC
  Administered 2016-04-02: 40 meq via ORAL
  Filled 2016-04-02: qty 2

## 2016-04-02 NOTE — NC FL2 (Signed)
[place] LEVEL OF CARE SCREENING TOOL     IDENTIFICATION  Patient Name: Darryl Diaz Birthdate: 03-11-1941 Sex: male Admission Date (Current Location): 03/30/2016  [facility] and [place] Number:  Herbalist and Address:         Provider Number: [id number]  Attending Physician Name and Address:  Modena Jansky, MD  Relative Name and Phone Number:       Current Level of Care: Hospital Recommended Level of Care: [place] Prior Approval Number:    Date Approved/Denied: 04/02/16 PASRR Number: [id number]  Discharge Plan: SNF    Current Diagnoses: Patient Active Problem List   Diagnosis Date Noted  . Hip fracture ([place]) 03/30/2016  . Hypertension 03/30/2016  . Hyperlipidemia 03/30/2016  . GERD (gastroesophageal reflux disease) 03/30/2016  . Osteoarthritis 03/30/2016  . Depression 03/30/2016  . Rhabdomyolysis 03/30/2016  . Leukocytosis 03/30/2016  . Pressure injury of skin 03/30/2016  . Closed fracture of left hip (HCC)     Orientation RESPIRATION BLADDER Height & Weight     Self, Time, Situation, Place  Normal Continent Weight: 235 lb (106.6 kg) Height:  6' (182.9 cm)  BEHAVIORAL SYMPTOMS/MOOD NEUROLOGICAL BOWEL NUTRITION STATUS      Continent Diet (Heart healthy; thin liquids)  AMBULATORY STATUS COMMUNICATION OF NEEDS Skin   Limited Assist Verbally PU Stage and Appropriate Care (Deep Tissue Injury (left ankle)- Purple or maroon localized area of discolored intact skin or blood-filled blister due to damage of underlying soft tissue from pressure and/or shear. Closed incision; left hip, adhesive bandage.)                       Personal Care Assistance Level of Assistance  Feeding, Dressing   Feeding assistance: Limited assistance Dressing Assistance: Independent     Functional Limitations Info  Sight, Hearing, Speech Sight Info: Adequate Hearing Info: Adequate Speech Info: Adequate    SPECIAL CARE FACTORS  FREQUENCY  PT (By licensed PT), OT (By licensed OT)     PT Frequency: 3x week OT Frequency: 3x week            Contractures Contractures Info: Not present    Additional Factors Info  Code Status, Allergies Code Status Info: Full Allergies Info: Penicillins           Current Medications (04/02/2016):  This is the current hospital active medication list Current Facility-Administered Medications  Medication Dose Route Frequency Provider Last Rate Last Dose  . acetaminophen (TYLENOL) suppository 650 mg  650 mg Rectal Q6H PRN Rondel Jumbo, PA-C      . acetaminophen (TYLENOL) tablet 650 mg  650 mg Oral Q6H PRN Paralee Cancel, MD   650 mg at 04/01/16 1746  . alum & mag hydroxide-simeth (MAALOX/MYLANTA) 200-200-20 MG/5ML suspension 30 mL  30 mL Oral Q4H PRN Paralee Cancel, MD      . apixaban Arne [place]) tablet 5 mg  5 mg Oral BID Paralee Cancel, MD   5 mg at 04/02/16 0813  . B-complex with vitamin C tablet 1 tablet  1 tablet Oral Daily Paralee Cancel, MD   1 tablet at 04/02/16 1012  . bisacodyl (DULCOLAX) suppository 10 mg  10 mg Rectal Daily PRN Rondel Jumbo, PA-C      . docusate sodium (COLACE) capsule 100 mg  100 mg Oral BID Paralee Cancel, MD   100 mg at 04/02/16 1013  . escitalopram (LEXAPRO) tablet 10 mg  10 mg Oral Daily Rondel Jumbo, PA-C  10 mg at 04/02/16 1014  . ferrous sulfate tablet 325 mg  325 mg Oral Q breakfast Rondel Jumbo, PA-C   325 mg at 04/02/16 4235  . ferrous sulfate tablet 325 mg  325 mg Oral TID PC Paralee Cancel, MD   325 mg at 04/02/16 0813  . folic acid (FOLVITE) tablet 1 mg  1 mg Oral Daily Paralee Cancel, MD   1 mg at 04/02/16 1014  . HYDROcodone-acetaminophen (NORCO/VICODIN) 5-325 MG per tablet 1-2 tablet  1-2 tablet Oral Q4H PRN Rondel Jumbo, PA-C   2 tablet at 04/02/16 1115  . loratadine (CLARITIN) tablet 10 mg  10 mg Oral Daily Paralee Cancel, MD   10 mg at 04/02/16 1012  . magnesium citrate solution 1 Bottle  1 Bottle Oral Once PRN Rondel Jumbo, PA-C       . menthol-cetylpyridinium (CEPACOL) lozenge 3 mg  1 lozenge Oral PRN Paralee Cancel, MD       Or  . phenol (CHLORASEPTIC) mouth spray 1 spray  1 spray Mouth/Throat PRN Paralee Cancel, MD      . methocarbamol (ROBAXIN) tablet 500 mg  500 mg Oral Q6H PRN Paralee Cancel, MD       Or  . methocarbamol (ROBAXIN) 500 mg in dextrose 5 % 50 mL IVPB  500 mg Intravenous Q6H PRN Paralee Cancel, MD      . metoCLOPramide (REGLAN) tablet 5-10 mg  5-10 mg Oral Q8H PRN Paralee Cancel, MD       Or  . metoCLOPramide (REGLAN) injection 5-10 mg  5-10 mg Intravenous Q8H PRN Paralee Cancel, MD      . metoprolol succinate (TOPROL-XL) 24 hr tablet 200 mg  200 mg Oral Daily Modena Jansky, MD   200 mg at 04/02/16 1014  . morphine 2 MG/ML injection 0.5 mg  0.5 mg Intravenous Q2H PRN Paralee Cancel, MD      . multivitamin with minerals tablet 1 tablet  1 tablet Oral Daily Paralee Cancel, MD   1 tablet at 04/02/16 1012  . ondansetron (ZOFRAN) tablet 4 mg  4 mg Oral Q6H PRN Paralee Cancel, MD       Or  . ondansetron [facility]) injection 4 mg  4 mg Intravenous Q6H PRN Paralee Cancel, MD      . pantoprazole (PROTONIX) EC tablet 40 mg  40 mg Oral Daily Rondel Jumbo, PA-C   40 mg at 04/02/16 1014  . polyethylene glycol (MIRALAX / GLYCOLAX) packet 17 g  17 g Oral Daily PRN Paralee Cancel, MD      . senna-docusate (Senokot-S) tablet 1 tablet  1 tablet Oral QHS PRN Rondel Jumbo, PA-C      . sodium chloride flush (NS) 0.9 % injection 3 mL  3 mL Intravenous Q12H Rondel Jumbo, PA-C   3 mL at 04/02/16 1000  . traZODone (DESYREL) tablet 50 mg  50 mg Oral QHS Rondel Jumbo, PA-C   50 mg at 04/01/16 2307     Discharge Medications: Please see discharge summary for a list of discharge medications.  Relevant Imaging Results:  Relevant Lab Results:   Additional Information SSN: [id number]  Sela Hilding, [place]

## 2016-04-02 NOTE — Progress Notes (Signed)
PROGRESS NOTE  Darryl Diaz  TKZ:[id number] DOB: [birth year]  DOA: 03/30/2016 PCP: [name], MD   Brief Narrative:  75 year old male with history of HLD, HTN, MDD, GERD, atrial fibrillation presented to ED after he was found by his family on the floor where he had been for about 2 days. He apparently had diarrhea and got up quickly go to the bathroom sustaining a fall onto his left hip. No history of LOC or head injury. He apparently stayed in the same position in his stools for extended period of time due to inability to move until his family found him. Imaging confirmed angulated fracture of left femoral neck. Orthopedics consulted and he underwent left hip hemiarthroplasty on 04/01/16.   Assessment & Plan:   Active Problems:   Hip fracture (HCC)   Hypertension   Hyperlipidemia   GERD (gastroesophageal reflux disease)   Osteoarthritis   Depression   Rhabdomyolysis   Leukocytosis   Pressure injury of skin   Closed fracture of left hip (HCC)   1. Displaced left femoral neck fracture, status post left hip hemiarthroplasty 04/01/16: Sustained status post mechanical fall. Orthopedics was consulted and after preoperative clearance, underwent left hip hemiarthroplasty on 04/01/16. Management per orthopedics. Eliquis started postop. Doing well. PT recommends SNF. 2. Acute kidney injury: Likely related to dehydration from poor oral intake. Resolved after IV fluid hydration. 3. Mild rhabdomyolysis: Presented with CK of 1192. This is improved to 650 after IV fluids. Continue IV fluids and follow CK in a.m. Temporarily hold statins. CK down to 308. DC IV fluids and encourage oral fluids. 4. Essential hypertension: Mildly uncontrolled at times. Continue metoprolol. 5. Hyperlipidemia: Hold statins due to rhabdomyolysis. 6. GERD: Continue PPI. 7. Paroxysmal Atrial fibrillation: EKG 03/30/16 confirms sinus rhythm without acute changes. Continue metoprolol. Eliquis was held for surgery and  being resumed postop on 11/24 evening. Patient was seen by his primary cardiologist Dr. Burt Knack on 12/28/15. [place] (patient recently moved 4 months ago from [place]). CHADS-Vasc = 2 8. Diarrhea: Seems to have resolved. 9. Anemia/postop acute blood loss anemia: Not sure if he has chronic anemia. Initial hemoglobin of 14.6 may be falsely high due to hemoconcentration from dehydration. Mild hemoglobin drop from 11.7 > 10.4. Follow CBC in a.m. 10. Thrombocytopenia: Stable. Follow CBC in a.m. 11. Elevated troponin: Most likely secondary to rhabdomyolysis. EKG without acute changes. Decreasing trend. No chest pain reported. 12. Hypokalemia: Replace and follow.   DVT prophylaxis: SCD's Code Status: Full Family Communication: None at bedside. Patient declined my offer to discuss with his family. Disposition Plan: SNF possibly low 11/27 pending bed availability.   Consultants:   Orthopedics  Procedures:   Left hip hemiarthroplasty 04/01/16  Antimicrobials:   None    Subjective: Patient was seen this morning. Was getting ready to work with PT. Stated that he had 0 pain in his left hip and was eager to work with PT.  Objective:  Vitals:   04/01/16 2104 04/02/16 0009 04/02/16 0540 04/02/16 1300  BP: (!) 118/56 127/72 (!) 155/63 117/72  Pulse: 79 80 74 77  Resp: 18 18 18 20   Temp: 98.5 F (36.9 C) 98.5 F (36.9 C) 98.3 F (36.8 C) 98.1 F (36.7 C)  TempSrc: Oral Oral Oral Oral  SpO2: 96% 96% 98% 98%  Weight:      Height:        Intake/Output Summary (Last 24 hours) at 04/02/16 1503 Last data filed at 04/02/16 1300  Gross per 24 hour  Intake              480 ml  Output             1300 ml  Net             -820 ml   Filed Weights   03/30/16 1006  Weight: 106.6 kg (235 lb)    Examination:  General exam: Pleasant elderly male lying comfortably propped up in bed. Respiratory system: Clear to auscultation. Respiratory effort  normal. Cardiovascular system: S1 & S2 heard, RRR. No JVD, murmurs, rubs, gallops or clicks. No pedal edema. Gastrointestinal system: Abdomen is nondistended, soft and nontender. No organomegaly or masses felt. Normal bowel sounds heard. Central nervous system: Alert and oriented. No focal neurological deficits. Extremities: Symmetric 5 x 5 power. Left hip postop dressing site clean and dry. Multiple superficial abrasions on bilateral legs. Skin: No rashes, lesions or ulcers Psychiatry: Judgement and insight appear normal. Mood & affect appropriate.     Data Reviewed: I have personally reviewed following labs and imaging studies  CBC:  Recent Labs Lab 03/30/16 1040 03/31/16 0416 04/01/16 0350 04/02/16 0400  WBC 19.6* 12.4* 12.0* 12.0*  NEUTROABS 16.9*  --   --   --   HGB 14.6 11.7* 11.7* 10.4*  HCT 41.8 34.6* 35.5* 30.0*  MCV 93.3 95.8 96.5 93.5  PLT 161 119* 107* 384*   Basic Metabolic Panel:  Recent Labs Lab 03/30/16 1040 03/31/16 0416 04/01/16 0350 04/02/16 0400  NA 139 138 138 136  K 3.7 3.5 3.4* 3.3*  CL 102 106 105 104  CO2 22 24 24 23   GLUCOSE 162* 109* 92 123*  BUN 14 13 11 12   CREATININE 1.51* 1.16 1.14 1.01  CALCIUM 9.1 8.2* 8.4* 8.1*   GFR: Estimated Creatinine Clearance: 81 mL/min (by C-G formula based on SCr of 1.01 mg/dL). Liver Function Tests:  Recent Labs Lab 03/30/16 1040 03/31/16 0416  AST 85* 63*  ALT 54 42  ALKPHOS 119 104  BILITOT 2.1* 1.1  PROT 6.7 5.3*  ALBUMIN 3.8 3.0*   No results for input(s): LIPASE, AMYLASE in the last 168 hours. No results for input(s): AMMONIA in the last 168 hours. Coagulation Profile:  Recent Labs Lab 03/30/16 1040 03/31/16 0416  INR 1.09 1.10   Cardiac Enzymes:  Recent Labs Lab 03/30/16 1040 03/30/16 1350 03/30/16 1915 03/31/16 0128 03/31/16 0416 04/02/16 0400  CKTOTAL 1,192*  --   --   --  650* 308  TROPONINI  --  0.08* 0.05* 0.04*  --   --    BNP (last 3 results) No results for  input(s): PROBNP in the last 8760 hours. HbA1C: No results for input(s): HGBA1C in the last 72 hours. CBG:  Recent Labs Lab 03/31/16 1156  GLUCAP 110*   Lipid Profile: No results for input(s): CHOL, HDL, LDLCALC, TRIG, CHOLHDL, LDLDIRECT in the last 72 hours. Thyroid Function Tests: No results for input(s): TSH, T4TOTAL, FREET4, T3FREE, THYROIDAB in the last 72 hours. Anemia Panel: No results for input(s): VITAMINB12, FOLATE, FERRITIN, TIBC, IRON, RETICCTPCT in the last 72 hours.  Sepsis Labs:  Recent Labs Lab 03/30/16 1102 03/30/16 1309  LATICACIDVEN 2.73* 2.39*    Recent Results (from the past 240 hour(s))  Urine culture     Status: Abnormal   Collection Time: 03/30/16 12:53 PM  Result Value Ref Range Status   Specimen Description URINE, CLEAN CATCH  Final   Special Requests NONE  Final  Culture MULTIPLE SPECIES PRESENT, SUGGEST RECOLLECTION (A)  Final   Report Status 03/31/2016 FINAL  Final  Culture, blood (Routine X 2) w Reflex to ID Panel     Status: None (Preliminary result)   Collection Time: 03/30/16  3:14 PM  Result Value Ref Range Status   Specimen Description BLOOD LEFT ARM  Final   Special Requests BOTTLES DRAWN AEROBIC AND ANAEROBIC 5CC EACH  Final   Culture NO GROWTH 2 DAYS  Final   Report Status PENDING  Incomplete  Culture, blood (Routine X 2) w Reflex to ID Panel     Status: None (Preliminary result)   Collection Time: 03/30/16  3:19 PM  Result Value Ref Range Status   Specimen Description BLOOD LEFT ARM  Final   Special Requests BOTTLES DRAWN AEROBIC AND ANAEROBIC 5CC EACH  Final   Culture NO GROWTH 2 DAYS  Final   Report Status PENDING  Incomplete  MRSA PCR Screening     Status: None   Collection Time: 03/31/16  4:23 AM  Result Value Ref Range Status   MRSA by PCR NEGATIVE NEGATIVE Final    Comment:        The GeneXpert MRSA Assay (FDA approved for NASAL specimens only), is one component of a comprehensive MRSA colonization surveillance  program. It is not intended to diagnose MRSA infection nor to guide or monitor treatment for MRSA infections.          Radiology Studies: Pelvis Portable  Result Date: 04/01/2016 CLINICAL DATA:  Status post left hip replacement. EXAM: PORTABLE PELVIS 1-2 VIEWS COMPARISON:  03/30/2016 FINDINGS: Two frontal views of the left hip demonstrate placement of 3 component left hip prosthesis. The previously seen left femoral neck supra trochanteric fracture has been excised. No new fractures are seen. Normal alignment of the orthopedic hardware. Expected postsurgical soft tissue emphysema. IMPRESSION: Status post left total hip arthroplasty without evidence of immediate complications. Electronically Signed   By: Fidela Salisbury M.D.   On: 04/01/2016 14:20        Scheduled Meds: . apixaban  5 mg Oral BID  . B-complex with vitamin C  1 tablet Oral Daily  . docusate sodium  100 mg Oral BID  . escitalopram  10 mg Oral Daily  . ferrous sulfate  325 mg Oral Q breakfast  . ferrous sulfate  325 mg Oral TID PC  . folic acid  1 mg Oral Daily  . loratadine  10 mg Oral Daily  . metoprolol  200 mg Oral Daily  . multivitamin with minerals  1 tablet Oral Daily  . pantoprazole  40 mg Oral Daily  . sodium chloride flush  3 mL Intravenous Q12H  . traZODone  50 mg Oral QHS   Continuous Infusions:    LOS: 3 days       [facility], MD [facility] Pager [phone]  If 7PM-7AM, please contact night-coverage [email or web address] Password [email or web address] 04/02/2016, 3:03 PM

## 2016-04-02 NOTE — Progress Notes (Signed)
PT is recommending SNF. Contacted Bridgette, SW for SNF referral.

## 2016-04-02 NOTE — Evaluation (Signed)
Occupational Therapy Evaluation Patient Details Name: Darryl Diaz MRN: [id number] DOB: 1940/05/14 Today's Date: 04/02/2016    History of Present Illness 75 yo male admitted on 03/30/16 through ED following fall resulting in left hip fx. Pt underwent hemiarthroplasty of left hip on 04/01/16. PMH significant for L ankle surgery and alcoholism.    Clinical Impression   Pt admitted with above, and demonstrates the below listed deficits.  He is able to perform LB ADLs with mod A and requires min A for functional mobility. He lives alone.  Feel he will benefit from SNF level rehab at discharge to maximize safety and independence with ADLs.     Follow Up Recommendations  SNF    Equipment Recommendations  None recommended by OT    Recommendations for Other Services       Precautions / Restrictions Precautions Precautions: Fall;Posterior Hip Precaution Booklet Issued: Yes (comment) Precaution Comments: reviewed posterior THA precautions with pt  Knee Immobilizer - Left: On when out of bed or walking Restrictions Weight Bearing Restrictions: Yes LLE Weight Bearing: Partial weight bearing LLE Partial Weight Bearing Percentage or Pounds: 50      Mobility Bed Mobility Overal bed mobility: Needs Assistance Bed Mobility: Supine to Sit;Sit to Supine     Supine to sit: Min assist Sit to supine: Min guard   General bed mobility comments: min A for Lt LE and verbal cues for sequencing and technique   Transfers Overall transfer level: Needs assistance Equipment used: Rolling walker (2 wheeled) Transfers: Sit to/from [facility] Sit to Stand: Min assist Stand pivot transfers: Min assist       General transfer comment: min A to move into standing and to steady     Balance Overall balance assessment: Needs assistance Sitting-balance support: Feet supported Sitting balance-Leahy Scale: Good     Standing balance support: Single extremity supported;During  functional activity Standing balance-Leahy Scale: Poor                              ADL Overall ADL's : Needs assistance/impaired Eating/Feeding: Independent   Grooming: Wash/dry hands;Wash/dry face;Oral care;Brushing hair;Minimal assistance;Standing   Upper Body Bathing: Supervision/ safety;Set up;Sitting   Lower Body Bathing: Moderate assistance;Sit to/from stand;With adaptive equipment;Adhering to hip precautions   Upper Body Dressing : Set up;Supervision/safety;Sitting   Lower Body Dressing: Moderate assistance;Adhering to hip precautions;With adaptive equipment   Toilet Transfer: Minimal assistance;Ambulation;Comfort height toilet;Regular Toilet;BSC;Grab bars;RW Armed forces technical officer Details (indicate cue type and reason): min cues for hand placement  Toileting- Clothing Manipulation and Hygiene: Minimal assistance;Sit to/from stand       Functional mobility during ADLs: Minimal assistance;Rolling walker General ADL Comments: Began instruction re: use of AE      Vision     Perception     Praxis      Pertinent Vitals/Pain Pain Assessment: No/denies pain     Hand Dominance Right   Extremity/Trunk Assessment Upper Extremity Assessment Upper Extremity Assessment: Generalized weakness   Lower Extremity Assessment Lower Extremity Assessment: Defer to PT evaluation   Cervical / Trunk Assessment Cervical / Trunk Assessment: Normal   Communication Communication Communication: No difficulties   Cognition Arousal/Alertness: Awake/alert Behavior During Therapy: WFL for tasks assessed/performed Overall Cognitive Status: Within Functional Limits for tasks assessed                     General Comments       Exercises  Shoulder Instructions      Home Living Family/patient expects to be discharged to:: Skilled nursing facility Living Arrangements: Alone Available Help at Discharge: Family;Available PRN/intermittently Type of Home:  House Home Access: Level entry     Home Layout: Two level;Able to live on main level with bedroom/bathroom;Full bath on main level Alternate Level Stairs-Number of Steps: NA does not use   Bathroom Shower/Tub: Occupational psychologist: Handicapped height     Home Equipment: Environmental consultant - 2 wheels;Wheelchair - [employer] - 4 wheels;Cane - single point;Shower seat;Shower seat - built in;Toilet riser          Prior Functioning/Environment Level of Independence: Independent                 OT Problem List: Decreased strength;Decreased activity tolerance;Impaired balance (sitting and/or standing);Decreased safety awareness;Decreased knowledge of use of DME or AE;Decreased knowledge of precautions   OT Treatment/Interventions:      OT Goals(Current goals can be found in the care plan section) Acute Rehab OT Goals Patient Stated Goal: to go to rehab and regain independence  OT Goal Formulation: All assessment and education complete, DC therapy (d/c acute OT )  OT Frequency:     Barriers to D/C:            Co-evaluation              End of Session Equipment Utilized During Treatment: Gait belt;Rolling walker Nurse Communication: Mobility status  Activity Tolerance: Patient tolerated treatment well Patient left: in bed;with call bell/phone within reach;with bed alarm set;with family/visitor present   Time: 0349-1791 OT Time Calculation (min): 32 min Charges:  OT General Charges $OT Visit: 1 Procedure OT Evaluation $OT Eval Low Complexity: 1 Procedure OT Treatments $Self Care/Home Management : 8-22 mins G-Codes:    Solae Norling M Apr 30, 2016, 6:06 PM

## 2016-04-02 NOTE — Clinical Social Work Note (Signed)
Clinical Social Work Assessment  Patient Details  Name: Darryl Diaz MRN: [id number] Date of Birth: [birth year]  Date of referral:  04/02/16               Reason for consult:  Facility Placement                Permission sought to share information with:  Family Supports Permission granted to share information::  Yes, Verbal Permission Granted  Name::     [name]  Agency::     Relationship::  Sister  Contact Information:  [phone]  Housing/Transportation Living arrangements for the past 2 months:  [place] of Information:  Patient Patient Interpreter Needed:  None Criminal Activity/Legal Involvement Pertinent to Current Situation/Hospitalization:  No - Comment as needed Significant Relationships:  Siblings Lives with:  Self Do you feel safe going back to the place where you live?  Yes Need for family participation in patient care:  Yes (Comment)  Care giving concerns:  Pt's sister and brother in law were present at bedside. There are no caregiver concerns at this time.   Social Worker assessment / plan:  CSW spoke with pt at bedside. Pt's sister and brother in law were present. Pt lives at home in [place] by himself. Pt has been to rehab before. Pt is familiar with SNF placement. Pt had a list of facilities he preferred when CSW enetered the room. Pt prefers [facility] and as back up would agree to [facility], [facility], or [facility]. CSW will fax pt out and provide bed offers once available. Pt anticipated d/c Monday 11/27.  Employment status:  Retired Forensic scientist:  Medicare PT Recommendations:  [place] / Referral to community resources:  [place]  Patient/Family's Response to care:  Pt verbalized understanding of CSW role and showed great appreciation of support.  Patient/Family's Understanding of and Emotional Response to Diagnosis, Current Treatment, and Prognosis:  Pt realistic and understanding of  physical limitations. Pt agreeable to SNF placement at this time. Pt verbalized understanding of the process of SNF placement and transportation. Pt denies any further questions or concerns.  Emotional Assessment Appearance:  Appears stated age Attitude/Demeanor/Rapport:   (Patient was appropriate and appreciative for CSW services) Affect (typically observed):  Accepting, Appropriate, Pleasant, Calm Orientation:  Oriented to Self, Oriented to Place, Oriented to  Time, Oriented to Situation Alcohol / Substance use:  Not Applicable Psych involvement (Current and /or in the community):  No (Comment)  Discharge Needs  Concerns to be addressed:  No discharge needs identified Readmission within the last 30 days:  No Current discharge risk:  Dependent with Mobility Barriers to Discharge:  Continued Medical Work up   [employer], [place]

## 2016-04-02 NOTE — Clinical Social Work Placement (Signed)
   CLINICAL SOCIAL WORK PLACEMENT  NOTE  Date:  04/02/2016  Patient Details  Name: Darryl Diaz MRN: [id number] Date of Birth: 05/03/1941  Clinical Social Work is seeking post-discharge placement for this patient at the [place] level of care (*CSW will initial, date and re-position this form in  chart as items are completed):      Patient/family provided with [place] Work Department's list of facilities offering this level of care within the geographic area requested by the patient (or if unable, by the patient's family).  Yes   Patient/family informed of their freedom to choose among providers that offer the needed level of care, that participate in Medicare, Medicaid or managed care program needed by the patient, have an available bed and are willing to accept the patient.      Patient/family informed of [facility]'s ownership interest in [facility], as well as of the fact that they are under no obligation to receive care at these facilities.  PASRR submitted to EDS on       PASRR number received on       Existing PASRR number confirmed on 04/02/16     FL2 transmitted to all facilities in geographic area requested by pt/family on 04/02/16     FL2 transmitted to all facilities within larger geographic area on 04/02/16     Patient informed that his/her managed care company has contracts with or will negotiate with certain facilities, including the following:            Patient/family informed of bed offers received.  Patient chooses bed at       Physician recommends and patient chooses bed at      Patient to be transferred to   on  .  Patient to be transferred to facility by       Patient family notified on   of transfer.  Name of family member notified:        PHYSICIAN       Additional Comment:    _______________________________________________ Alla German, LCSW 04/02/2016, 3:27 PM

## 2016-04-02 NOTE — Clinical Social Work Note (Signed)
CSW acknowledges CSW consult for SNF placement. Awaiting PT recommendations. PT eval pending at this time.  [address], [place]

## 2016-04-02 NOTE — Progress Notes (Signed)
   Subjective: 1 Day Post-Op Procedure(s) (LRB): ARTHROPLASTY BIPOLAR HIP (HEMIARTHROPLASTY) (Left) Patient reports pain as zero Patient seen in rounds for Dr. Alvan Dame. Patient is well, and has had no acute complaints or problems We will resume therapy today.  He has already been up walking earlier today. Plan is to go Skilled nursing facility after hospital stay.  Objective: Vital signs in last 24 hours: Temp:  [98.3 F (36.8 C)-98.7 F (37.1 C)] 98.3 F (36.8 C) (11/25 0540) Pulse Rate:  [74-80] 74 (11/25 0540) Resp:  [16-20] 18 (11/25 0540) BP: (118-155)/(56-83) 155/63 (11/25 0540) SpO2:  [96 %-99 %] 98 % (11/25 0540)  Intake/Output from previous day:  Intake/Output Summary (Last 24 hours) at 04/02/16 1005 Last data filed at 04/02/16 0541  Gross per 24 hour  Intake              600 ml  Output             1550 ml  Net             -950 ml    Intake/Output this shift: No intake/output data recorded.  Labs:  Recent Labs  03/30/16 1040 03/31/16 0416 04/01/16 0350 04/02/16 0400  HGB 14.6 11.7* 11.7* 10.4*    Recent Labs  04/01/16 0350 04/02/16 0400  WBC 12.0* 12.0*  RBC 3.68* 3.21*  HCT 35.5* 30.0*  PLT 107* 121*    Recent Labs  04/01/16 0350 04/02/16 0400  NA 138 136  K 3.4* 3.3*  CL 105 104  CO2 24 23  BUN 11 12  CREATININE 1.14 1.01  GLUCOSE 92 123*  CALCIUM 8.4* 8.1*    Recent Labs  03/30/16 1040 03/31/16 0416  INR 1.09 1.10    EXAM General - Patient is Alert, Appropriate and Oriented Extremity - Neurovascular intact Sensation intact distally Intact pulses distally Dorsiflexion/Plantar flexion intact Dressing - dressing C/D/I Motor Function - intact, moving foot and toes well on exam.   Past Medical History:  Diagnosis Date  . Alcoholism ([place])    in remission following wife's death  . Atrial fibrillation ([place])   . Fall at home 03/28/2016  . GERD (gastroesophageal reflux disease)   . H/O seasonal allergies   . Hyperlipidemia     . Hypertension   . Insomnia   . 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 OA (osteoarthritis)    hands    Assessment/Plan: 1 Day Post-Op Procedure(s) (LRB): ARTHROPLASTY BIPOLAR HIP (HEMIARTHROPLASTY) (Left) Active Problems:   Hip fracture ([facility])   Hypertension   Hyperlipidemia   GERD (gastroesophageal reflux disease)   Osteoarthritis   Depression   Rhabdomyolysis   Leukocytosis   Pressure injury of skin   Closed fracture of left hip (H[facility])  Estimated body mass index is 31.87 kg/m as calculated from the following:   Height as of this encounter: 6' (1.829 m).   Weight as of this encounter: 106.6 kg (235 lb). Advance diet Up with therapy Discharge to SNF  DVT Prophylaxis - Eliquis Weight Bearing As Tolerated left Leg D/C Knee Immobilizer Begin Therapy Hip Preacutions Social Worker  Arlee Muslim, PA-C Orthopaedic Surgery 04/02/2016, 10:05 AM

## 2016-04-02 NOTE — Evaluation (Signed)
Physical Therapy Evaluation Patient Details Name: Darryl Diaz MRN: [id number] DOB: [birth year] Today's Date: 04/02/2016   History of Present Illness  75 yo male admitted on 03/30/16 through ED following fall resulting in left hip fx. Pt underwent hemiarthroplasty of left hip on 04/01/16. PMH significant for L ankle surgery and alcoholism.   Clinical Impression  Pt is POD 1 and moving well with therapy. Pt lives alone in a townhouse but lives maintain on first level. Pt was completely independent with all ADLs and IADLs including driving short distances. Pt had a fall in his bathroom and laid on the floor for two days before his family found him and brought him to ED. Pt would like to go to SNF for rehab prior to DC home as he does live alone and would like the security of a facility to recover. Pt will benefit from being seen acutely to address the below deficits in order to assist with a smooth transition upon Dc.     Follow Up Recommendations SNF    Equipment Recommendations  None recommended by PT    Recommendations for Other Services       Precautions / Restrictions Precautions Precautions: Fall;Posterior Hip Precaution Booklet Issued: Yes (comment) Precaution Comments: reviewed handout and reviewed with client Required Braces or Orthoses: Knee Immobilizer - Left Knee Immobilizer - Left: On when out of bed or walking Restrictions Weight Bearing Restrictions: Yes LLE Weight Bearing: Partial weight bearing LLE Partial Weight Bearing Percentage or Pounds: 50      Mobility  Bed Mobility Overal bed mobility: Needs Assistance Bed Mobility: Supine to Sit     Supine to sit: Min assist     General bed mobility comments: min a for safety and maintain hip precautions to move LLE to EOB  Transfers Overall transfer level: Needs assistance Equipment used: Rolling walker (2 wheeled) Transfers: Sit to/from Stand Sit to Stand: Min guard         General transfer comment: Min  guard for safety to maintain wbing precautions  Ambulation/Gait Ambulation/Gait assistance: Min assist Ambulation Distance (Feet): 30 Feet Assistive device: Rolling walker (2 wheeled) Gait Pattern/deviations: Step-through pattern;Decreased step length - right;Decreased stance time - right;Antalgic Gait velocity: decreased Gait velocity interpretation: Below normal speed for age/gender General Gait Details: mild antalgic gait, requires cues to use UE's to maintain wbing precautions  Stairs            Wheelchair Mobility    Modified Rankin (Stroke Patients Only)       Balance                                             Pertinent Vitals/Pain Pain Assessment: No/denies pain    Home Living Family/patient expects to be discharged to:: Private residence Living Arrangements: Alone Available Help at Discharge: Family;Available PRN/intermittently Type of Home: House Home Access: Level entry     Home Layout: Two level;Able to live on main level with bedroom/bathroom;Full bath on main level Home Equipment: Walker - 2 wheels;Wheelchair - [employer] - 4 wheels;Cane - single point;Shower seat;Shower seat - built in;Toilet riser      Prior Function Level of Independence: Independent         Comments: has been focusing on moving and rearranging his home that he has been in for 4 months     Hand Dominance   Dominant Hand: Right  Extremity/Trunk Assessment   Upper Extremity Assessment: Defer to OT evaluation           Lower Extremity Assessment: LLE deficits/detail   LLE Deficits / Details: Pt with normal post op pain and weakness. At least 3/5 ankle, 3/5 knee and 2/5 hip per gross functional assessment     Communication   Communication: No difficulties  Cognition Arousal/Alertness: Awake/alert Behavior During Therapy: WFL for tasks assessed/performed Overall Cognitive Status: Within Functional Limits for tasks assessed                       General Comments      Exercises Total Joint Exercises Ankle Circles/Pumps: AROM;15 reps;Both;Supine Quad Sets: AROM;Left;10 reps;Supine Short Arc Quad: AROM;Left;10 reps;Supine Hip ABduction/ADduction: AROM;Left;10 reps;Supine   Assessment/Plan    PT Assessment Patient needs continued PT services  PT Problem List Decreased strength;Decreased activity tolerance;Decreased balance;Decreased mobility;Decreased coordination;Decreased knowledge of use of DME          PT Treatment Interventions DME instruction;Gait training;Functional mobility training;Therapeutic activities;Therapeutic exercise;Balance training;Patient/family education    PT Goals (Current goals can be found in the Care Plan section)  Acute Rehab PT Goals Patient Stated Goal: to go to rehab and get stronger before going home PT Goal Formulation: With patient Time For Goal Achievement: 04/09/16 Potential to Achieve Goals: Good    Frequency 7X/week   Barriers to discharge        Co-evaluation               End of Session Equipment Utilized During Treatment: Gait belt Activity Tolerance: Patient tolerated treatment well Patient left: in chair;with call bell/phone within reach Nurse Communication: Mobility status         Time: 6222-9798 PT Time Calculation (min) (ACUTE ONLY): 44 min   Charges:   PT Evaluation $PT Eval Low Complexity: 1 Procedure PT Treatments $Gait Training: 8-22 mins $Therapeutic Exercise: 8-22 mins $Therapeutic Activity: 8-22 mins   PT G Codes:        Scheryl Marten PT, DPT  [phone]  04/02/2016, 1:50 PM

## 2016-04-03 ENCOUNTER — Encounter (HOSPITAL_COMMUNITY): Payer: Self-pay | Admitting: Orthopedic Surgery

## 2016-04-03 DIAGNOSIS — E876 Hypokalemia: Secondary | ICD-10-CM

## 2016-04-03 LAB — CBC
HCT: 29.7 % — ABNORMAL LOW (ref 39.0–52.0)
Hemoglobin: 10.2 g/dL — ABNORMAL LOW (ref 13.0–17.0)
MCH: 32.5 pg (ref 26.0–34.0)
MCHC: 34.3 g/dL (ref 30.0–36.0)
MCV: 94.6 fL (ref 78.0–100.0)
Platelets: 133 10*3/uL — ABNORMAL LOW (ref 150–400)
RBC: 3.14 MIL/uL — ABNORMAL LOW (ref 4.22–5.81)
RDW: 14.5 % (ref 11.5–15.5)
WBC: 10.4 10*3/uL (ref 4.0–10.5)

## 2016-04-03 LAB — BASIC METABOLIC PANEL
Anion gap: 9 (ref 5–15)
BUN: 12 mg/dL (ref 6–20)
CO2: 25 mmol/L (ref 22–32)
Calcium: 8.4 mg/dL — ABNORMAL LOW (ref 8.9–10.3)
Chloride: 105 mmol/L (ref 101–111)
Creatinine, Ser: 1.08 mg/dL (ref 0.61–1.24)
GFR calc Af Amer: >60 mL/min (ref >60)
GFR calc non Af Amer: >60 mL/min (ref >60)
Glucose, Bld: 97 mg/dL (ref 65–99)
Potassium: 3.3 mmol/L — ABNORMAL LOW (ref 3.5–5.1)
Sodium: 139 mmol/L (ref 135–145)

## 2016-04-03 LAB — MAGNESIUM: Magnesium: 1.4 mg/dL — ABNORMAL LOW (ref 1.7–2.4)

## 2016-04-03 MED ORDER — POTASSIUM CHLORIDE CRYS ER 10 MEQ PO TBCR
30.0000 meq | EXTENDED_RELEASE_TABLET | ORAL | Status: AC
  Administered 2016-04-03 (×2): 30 meq via ORAL
  Filled 2016-04-03: qty 1

## 2016-04-03 MED ORDER — MAGNESIUM SULFATE 4 GM/100ML IV SOLN
4.0000 g | Freq: Once | INTRAVENOUS | Status: DC
  Filled 2016-04-03: qty 100

## 2016-04-03 NOTE — Progress Notes (Signed)
Physical Therapy Treatment Patient Details Name: Darryl Diaz MRN: [id number] DOB: 08/27/1940 Today's Date: 04/03/2016    History of Present Illness 75 yo male admitted on 03/30/16 through ED following fall resulting in left hip fx. Pt underwent hemiarthroplasty of left hip on 04/01/16. PMH significant for L ankle surgery and alcoholism.     PT Comments    Pt is POD 2 and moving well with therapy and no c/o pain. Performed supine exercises and initiated seated LAQ to assist with improving quad strength. Pt is able to recall all three hip precautions and is compliant performing activities maintaining these precautions. Pt does, however, have increased dyspnea with longer distance gait this session and requires 1-2 min rest break following gait before he is able to continue with LE exercise. Pt continues to benefit from SNF placement due to deconditioning and lack of continuous support following discharge.    Follow Up Recommendations  SNF     Equipment Recommendations  None recommended by PT    Recommendations for Other Services       Precautions / Restrictions Precautions Precautions: Fall;Posterior Hip Precaution Booklet Issued: Yes (comment) Precaution Comments: pt recalled 3/3 posterior hip pecautions Required Braces or Orthoses: Knee Immobilizer - Left Knee Immobilizer - Left: Other (comment) (Per MD, at therapist discretion) Restrictions Weight Bearing Restrictions: Yes LLE Weight Bearing: Partial weight bearing LLE Partial Weight Bearing Percentage or Pounds: 50    Mobility  Bed Mobility Overal bed mobility: Needs Assistance Bed Mobility: Supine to Sit     Supine to sit: Min assist     General bed mobility comments: Min a for LLE and verbal cues for proper sequencing  Transfers Overall transfer level: Needs assistance Equipment used: Rolling walker (2 wheeled) Transfers: Sit to/from Stand Sit to Stand: Min assist         General transfer comment: Min a  to steady and to maintain hip precautions, decreasd cueing required this session  Ambulation/Gait Ambulation/Gait assistance: Min assist Ambulation Distance (Feet): 75 Feet Assistive device: Rolling walker (2 wheeled) Gait Pattern/deviations: Step-through pattern;Antalgic Gait velocity: decreased Gait velocity interpretation: Below normal speed for age/gender General Gait Details: mild antalgic gait, requires cues to use UE's to maintain wbing precautions   Stairs            Wheelchair Mobility    Modified Rankin (Stroke Patients Only)       Balance Overall balance assessment: Needs assistance Sitting-balance support: No upper extremity supported;Feet supported Sitting balance-Leahy Scale: Good Sitting balance - Comments: EOB no back support   Standing balance support: Single extremity supported Standing balance-Leahy Scale: Poor Standing balance comment: relies on at least 1 UE to maintain wbing precautions when weight shifting in standing                    Cognition Arousal/Alertness: Awake/alert Behavior During Therapy: WFL for tasks assessed/performed Overall Cognitive Status: Within Functional Limits for tasks assessed                      Exercises Total Joint Exercises Ankle Circles/Pumps: AROM;Both;20 reps Quad Sets: AROM;Left;10 reps;Supine Short Arc Quad: AROM;Left;10 reps;Supine Heel Slides: AROM;Left;10 reps;Supine Hip ABduction/ADduction: AROM;Left;10 reps;Supine Long Arc Quad: AROM;Left;10 reps;Seated    General Comments        Pertinent Vitals/Pain Pain Assessment: No/denies pain    Home Living                      Prior Function  PT Goals (current goals can now be found in the care plan section) Acute Rehab PT Goals Patient Stated Goal: to go to rehab and regain independence  Progress towards PT goals: Progressing toward goals    Frequency    7X/week      PT Plan Current plan remains  appropriate    Co-evaluation             End of Session Equipment Utilized During Treatment: Gait belt Activity Tolerance: Patient tolerated treatment well Patient left: in chair;with call bell/phone within reach     Time: 0739-0805 PT Time Calculation (min) (ACUTE ONLY): 26 min  Charges:  $Gait Training: 8-22 mins $Therapeutic Exercise: 8-22 mins                    G Codes:      Scheryl Marten PT, DPT  [phone]  04/03/2016, 8:11 AM

## 2016-04-03 NOTE — Progress Notes (Signed)
   Subjective:  Patient reports pain as mild to moderate.  No c/o.  Objective:   VITALS:   Vitals:   04/02/16 0540 04/02/16 1300 04/02/16 2039 04/03/16 0519  BP: (!) 155/63 117/72 130/60 (!) 145/69  Pulse: 74 77 72 71  Resp: 18 20 16 16   Temp: 98.3 F (36.8 C) 98.1 F (36.7 C) 98.6 F (37 C) 98.7 F (37.1 C)  TempSrc: Oral Oral Oral Oral  SpO2: 98% 98% 98% 99%  Weight:      Height:        NAD Sensation intact distally Intact pulses distally Dorsiflexion/Plantar flexion intact Incision: dressing C/D/I Compartment soft    Lab Results  Component Value Date   WBC 10.4 04/03/2016   HGB 10.2 (L) 04/03/2016   HCT 29.7 (L) 04/03/2016   MCV 94.6 04/03/2016   PLT 133 (L) 04/03/2016   BMET    Component Value Date/Time   NA 139 04/03/2016 0430   K 3.3 (L) 04/03/2016 0430   CL 105 04/03/2016 0430   CO2 25 04/03/2016 0430   GLUCOSE 97 04/03/2016 0430   BUN 12 04/03/2016 0430   CREATININE 1.08 04/03/2016 0430   CALCIUM 8.4 (L) 04/03/2016 0430   GFRNONAA >60 04/03/2016 0430   GFRAA >60 04/03/2016 0430     Assessment/Plan: 2 Days Post-Op   Active Problems:   Hip fracture (HCC)   Hypertension   Hyperlipidemia   GERD (gastroesophageal reflux disease)   Osteoarthritis   Depression   Rhabdomyolysis   Leukocytosis   Pressure injury of skin   Closed fracture of left hip (HCC)   DVT Prophylaxis - Eliquis Weight Bearing As Tolerated left Leg D/C Knee Immobilizer Begin Therapy Hip Preacutions Social Worker   Darryl Diaz, Horald Pollen 04/03/2016, 7:48 AM   Rod Can, MD Cell [phone]

## 2016-04-03 NOTE — Progress Notes (Signed)
PROGRESS NOTE  Darryl Diaz  INO:[id number] DOB: [birth year]  DOA: 03/30/2016 PCP: [name], MD   Brief Narrative:  75 year old male with history of HLD, HTN, MDD, GERD, atrial fibrillation presented to ED after he was found by his family on the floor where he had been for about 2 days. He apparently had diarrhea and got up quickly go to the bathroom sustaining a fall onto his left hip. No history of LOC or head injury. He apparently stayed in the same position in his stools for extended period of time due to inability to move until his family found him. Imaging confirmed angulated fracture of left femoral neck. Orthopedics consulted and he underwent left hip hemiarthroplasty on 04/01/16. DC to SNF when bed available.   Assessment & Plan:   Active Problems:   Hip fracture (HCC)   Hypertension   Hyperlipidemia   GERD (gastroesophageal reflux disease)   Osteoarthritis   Depression   Rhabdomyolysis   Leukocytosis   Pressure injury of skin   Closed fracture of left hip (HCC)   1. Displaced left femoral neck fracture, status post left hip hemiarthroplasty 04/01/16: Sustained status post mechanical fall. Orthopedics was consulted and after preoperative clearance, underwent left hip hemiarthroplasty on 04/01/16. Management per orthopedics. Eliquis started postop. Doing well. PT recommends SNF. DC to SNF when bed available. Outpatient follow-up with orthopedics. 2. Acute kidney injury: Likely related to dehydration from poor oral intake. Resolved after IV fluid hydration. 3. Mild rhabdomyolysis: Presented with CK of 1192. This is improved to 650 after IV fluids. Continue IV fluids and follow CK in a.m. Temporarily hold statins. CK down to 308. DC IV fluids and encourage oral fluids. 4. Essential hypertension: Mildly uncontrolled at times. Continue metoprolol. 5. Hyperlipidemia: Hold statins due to rhabdomyolysis. Resume statins at discharge. 6. GERD: Continue PPI. 7. Paroxysmal Atrial  fibrillation: EKG 03/30/16 confirms sinus rhythm without acute changes. Continue metoprolol. Eliquis was held for surgery and resumed postop. Patient was seen by his primary cardiologist Dr. Burt Knack on 12/28/15. [place] (patient recently moved 4 months ago from [place]). CHADS-Vasc = 2 8. Diarrhea: resolved. 9. Anemia/postop acute blood loss anemia: Not sure if he has chronic anemia. Initial hemoglobin of 14.6 may be falsely high due to hemoconcentration from dehydration. Mild hemoglobin drop from 11.7 > 10.4. Hemoglobin stable in the low 10 g range. 10. Thrombocytopenia: Stable/improving. 11. Elevated troponin: Most likely secondary to rhabdomyolysis. EKG without acute changes. Decreasing trend. No chest pain reported. 12. Hypokalemia/hypomagnesemia: Replace and follow.   DVT prophylaxis: Eliquis Code Status: Full Family Communication: None at bedside. Patient declined my offer to discuss with his family couple of days back. Disposition Plan: SNF possibly low 11/27 pending bed availability and orthopedics clearance.   Consultants:   Orthopedics  Procedures:   Left hip hemiarthroplasty 04/01/16  Antimicrobials:   None    Subjective: Denies left hip pain. States that he has ambulated 3 times with PT since yesterday. Denies any other complaints.  Objective:  Vitals:   04/02/16 0540 04/02/16 1300 04/02/16 2039 04/03/16 0519  BP: (!) 155/63 117/72 130/60 (!) 145/69  Pulse: 74 77 72 71  Resp: 18 20 16 16   Temp: 98.3 F (36.8 C) 98.1 F (36.7 C) 98.6 F (37 C) 98.7 F (37.1 C)  TempSrc: Oral Oral Oral Oral  SpO2: 98% 98% 98% 99%  Weight:      Height:        Intake/Output Summary (Last 24 hours)  at 04/03/16 1220 Last data filed at 04/03/16 0520  Gross per 24 hour  Intake              920 ml  Output             1900 ml  Net             -980 ml   Filed Weights   03/30/16 1006  Weight: 106.6 kg (235 lb)    Examination:  General exam:  Pleasant elderly male sitting up comfortably in chair. Respiratory system: Clear to auscultation. Respiratory effort normal. Cardiovascular system: S1 & S2 heard, RRR. No JVD, murmurs, rubs, gallops or clicks. No pedal edema. Gastrointestinal system: Abdomen is nondistended, soft and nontender. No organomegaly or masses felt. Normal bowel sounds heard. Central nervous system: Alert and oriented. No focal neurological deficits. Extremities: Symmetric 5 x 5 power. Left hip postop dressing site clean and dry. Multiple superficial abrasions on bilateral legs. Skin: No rashes, lesions or ulcers Psychiatry: Judgement and insight appear normal. Mood & affect appropriate.     Data Reviewed: I have personally reviewed following labs and imaging studies  CBC:  Recent Labs Lab 03/30/16 1040 03/31/16 0416 04/01/16 0350 04/02/16 0400 04/03/16 0430  WBC 19.6* 12.4* 12.0* 12.0* 10.4  NEUTROABS 16.9*  --   --   --   --   HGB 14.6 11.7* 11.7* 10.4* 10.2*  HCT 41.8 34.6* 35.5* 30.0* 29.7*  MCV 93.3 95.8 96.5 93.5 94.6  PLT 161 119* 107* 121* 811*   Basic Metabolic Panel:  Recent Labs Lab 03/30/16 1040 03/31/16 0416 04/01/16 0350 04/02/16 0400 04/03/16 0430 04/03/16 0833  NA 139 138 138 136 139  --   K 3.7 3.5 3.4* 3.3* 3.3*  --   CL 102 106 105 104 105  --   CO2 22 24 24 23 25   --   GLUCOSE 162* 109* 92 123* 97  --   BUN 14 13 11 12 12   --   CREATININE 1.51* 1.16 1.14 1.01 1.08  --   CALCIUM 9.1 8.2* 8.4* 8.1* 8.4*  --   MG  --   --   --   --   --  1.4*   GFR: Estimated Creatinine Clearance: 75.7 mL/min (by C-G formula based on SCr of 1.08 mg/dL). Liver Function Tests:  Recent Labs Lab 03/30/16 1040 03/31/16 0416  AST 85* 63*  ALT 54 42  ALKPHOS 119 104  BILITOT 2.1* 1.1  PROT 6.7 5.3*  ALBUMIN 3.8 3.0*   No results for input(s): LIPASE, AMYLASE in the last 168 hours. No results for input(s): AMMONIA in the last 168 hours. Coagulation Profile:  Recent Labs Lab  03/30/16 1040 03/31/16 0416  INR 1.09 1.10   Cardiac Enzymes:  Recent Labs Lab 03/30/16 1040 03/30/16 1350 03/30/16 1915 03/31/16 0128 03/31/16 0416 04/02/16 0400  CKTOTAL 1,192*  --   --   --  650* 308  TROPONINI  --  0.08* 0.05* 0.04*  --   --    BNP (last 3 results) No results for input(s): PROBNP in the last 8760 hours. HbA1C: No results for input(s): HGBA1C in the last 72 hours. CBG:  Recent Labs Lab 03/31/16 1156  GLUCAP 110*   Lipid Profile: No results for input(s): CHOL, HDL, LDLCALC, TRIG, CHOLHDL, LDLDIRECT in the last 72 hours. Thyroid Function Tests: No results for input(s): TSH, T4TOTAL, FREET4, T3FREE, THYROIDAB in the last 72 hours. Anemia Panel: No results for input(s): VITAMINB12, FOLATE,  FERRITIN, TIBC, IRON, RETICCTPCT in the last 72 hours.  Sepsis Labs:  Recent Labs Lab 03/30/16 1102 03/30/16 1309  LATICACIDVEN 2.73* 2.39*    Recent Results (from the past 240 hour(s))  Urine culture     Status: Abnormal   Collection Time: 03/30/16 12:53 PM  Result Value Ref Range Status   Specimen Description URINE, CLEAN CATCH  Final   Special Requests NONE  Final   Culture MULTIPLE SPECIES PRESENT, SUGGEST RECOLLECTION (A)  Final   Report Status 03/31/2016 FINAL  Final  Culture, blood (Routine X 2) w Reflex to ID Panel     Status: None (Preliminary result)   Collection Time: 03/30/16  3:14 PM  Result Value Ref Range Status   Specimen Description BLOOD LEFT ARM  Final   Special Requests BOTTLES DRAWN AEROBIC AND ANAEROBIC 5CC EACH  Final   Culture NO GROWTH 3 DAYS  Final   Report Status PENDING  Incomplete  Culture, blood (Routine X 2) w Reflex to ID Panel     Status: None (Preliminary result)   Collection Time: 03/30/16  3:19 PM  Result Value Ref Range Status   Specimen Description BLOOD LEFT ARM  Final   Special Requests BOTTLES DRAWN AEROBIC AND ANAEROBIC 5CC EACH  Final   Culture NO GROWTH 3 DAYS  Final   Report Status PENDING  Incomplete    MRSA PCR Screening     Status: None   Collection Time: 03/31/16  4:23 AM  Result Value Ref Range Status   MRSA by PCR NEGATIVE NEGATIVE Final    Comment:        The GeneXpert MRSA Assay (FDA approved for NASAL specimens only), is one component of a comprehensive MRSA colonization surveillance program. It is not intended to diagnose MRSA infection nor to guide or monitor treatment for MRSA infections.          Radiology Studies: Pelvis Portable  Result Date: 04/01/2016 CLINICAL DATA:  Status post left hip replacement. EXAM: PORTABLE PELVIS 1-2 VIEWS COMPARISON:  03/30/2016 FINDINGS: Two frontal views of the left hip demonstrate placement of 3 component left hip prosthesis. The previously seen left femoral neck supra trochanteric fracture has been excised. No new fractures are seen. Normal alignment of the orthopedic hardware. Expected postsurgical soft tissue emphysema. IMPRESSION: Status post left total hip arthroplasty without evidence of immediate complications. Electronically Signed   By: Fidela Salisbury M.D.   On: 04/01/2016 14:20        Scheduled Meds: . apixaban  5 mg Oral BID  . B-complex with vitamin C  1 tablet Oral Daily  . docusate sodium  100 mg Oral BID  . escitalopram  10 mg Oral Daily  . ferrous sulfate  325 mg Oral Q breakfast  . ferrous sulfate  325 mg Oral TID PC  . folic acid  1 mg Oral Daily  . loratadine  10 mg Oral Daily  . metoprolol  200 mg Oral Daily  . multivitamin with minerals  1 tablet Oral Daily  . pantoprazole  40 mg Oral Daily  . sodium chloride flush  3 mL Intravenous Q12H  . traZODone  50 mg Oral QHS   Continuous Infusions:    LOS: 4 days       [facility], MD [facility] Pager [phone]  If 7PM-7AM, please contact night-coverage [email or web address] Password [email or web address] 04/03/2016, 12:20 PM

## 2016-04-03 NOTE — Clinical Social Work Note (Signed)
MSW met with patient to extend bed offer from [facility], currently this is patient's only bed offer.   Patient reported that he is interested in [facility] [place] as his first option. In the event, [facility] does not have a bed for patient at discharge, patient is agreeable to [facility].   Unit CSW to follow up with [facility] Monday, 11/27 morning.   Glendon Axe, MSW [phone] 04/03/2016 2:00 PM

## 2016-04-04 DIAGNOSIS — S72002D Fracture of unspecified part of neck of left femur, subsequent encounter for closed fracture with routine healing: Secondary | ICD-10-CM | POA: Diagnosis not present

## 2016-04-04 DIAGNOSIS — R531 Weakness: Secondary | ICD-10-CM | POA: Diagnosis not present

## 2016-04-04 DIAGNOSIS — K219 Gastro-esophageal reflux disease without esophagitis: Secondary | ICD-10-CM | POA: Diagnosis not present

## 2016-04-04 DIAGNOSIS — E785 Hyperlipidemia, unspecified: Secondary | ICD-10-CM | POA: Diagnosis not present

## 2016-04-04 DIAGNOSIS — I10 Essential (primary) hypertension: Secondary | ICD-10-CM | POA: Diagnosis not present

## 2016-04-04 DIAGNOSIS — M6282 Rhabdomyolysis: Secondary | ICD-10-CM | POA: Diagnosis not present

## 2016-04-04 DIAGNOSIS — M1990 Unspecified osteoarthritis, unspecified site: Secondary | ICD-10-CM | POA: Diagnosis not present

## 2016-04-04 DIAGNOSIS — R262 Difficulty in walking, not elsewhere classified: Secondary | ICD-10-CM | POA: Diagnosis not present

## 2016-04-04 DIAGNOSIS — Z9181 History of falling: Secondary | ICD-10-CM | POA: Diagnosis not present

## 2016-04-04 DIAGNOSIS — F329 Major depressive disorder, single episode, unspecified: Secondary | ICD-10-CM | POA: Diagnosis not present

## 2016-04-04 DIAGNOSIS — I4891 Unspecified atrial fibrillation: Secondary | ICD-10-CM | POA: Diagnosis not present

## 2016-04-04 DIAGNOSIS — S72009A Fracture of unspecified part of neck of unspecified femur, initial encounter for closed fracture: Secondary | ICD-10-CM | POA: Diagnosis not present

## 2016-04-04 LAB — CBC
HCT: 30.8 % — ABNORMAL LOW (ref 39.0–52.0)
Hemoglobin: 10.4 g/dL — ABNORMAL LOW (ref 13.0–17.0)
MCH: 32 pg (ref 26.0–34.0)
MCHC: 33.8 g/dL (ref 30.0–36.0)
MCV: 94.8 fL (ref 78.0–100.0)
Platelets: 176 10*3/uL (ref 150–400)
RBC: 3.25 MIL/uL — ABNORMAL LOW (ref 4.22–5.81)
RDW: 14.8 % (ref 11.5–15.5)
WBC: 10 10*3/uL (ref 4.0–10.5)

## 2016-04-04 LAB — COMPREHENSIVE METABOLIC PANEL
ALT: 33 U/L (ref 17–63)
AST: 40 U/L (ref 15–41)
Albumin: 2.7 g/dL — ABNORMAL LOW (ref 3.5–5.0)
Alkaline Phosphatase: 78 U/L (ref 38–126)
Anion gap: 9 (ref 5–15)
BUN: 10 mg/dL (ref 6–20)
CO2: 24 mmol/L (ref 22–32)
Calcium: 8.7 mg/dL — ABNORMAL LOW (ref 8.9–10.3)
Chloride: 106 mmol/L (ref 101–111)
Creatinine, Ser: 1.09 mg/dL (ref 0.61–1.24)
GFR calc Af Amer: >60 mL/min (ref >60)
GFR calc non Af Amer: >60 mL/min (ref >60)
Glucose, Bld: 106 mg/dL — ABNORMAL HIGH (ref 65–99)
Potassium: 3.4 mmol/L — ABNORMAL LOW (ref 3.5–5.1)
Sodium: 139 mmol/L (ref 135–145)
Total Bilirubin: 1 mg/dL (ref 0.3–1.2)
Total Protein: 5.5 g/dL — ABNORMAL LOW (ref 6.5–8.1)

## 2016-04-04 LAB — CULTURE, BLOOD (ROUTINE X 2)
Culture: NO GROWTH
Culture: NO GROWTH

## 2016-04-04 LAB — MAGNESIUM: Magnesium: 1.4 mg/dL — ABNORMAL LOW (ref 1.7–2.4)

## 2016-04-04 MED ORDER — HYDROCODONE-ACETAMINOPHEN 5-325 MG PO TABS: 1.0000 | ORAL_TABLET | ORAL | 0 refills | Status: DC | PRN

## 2016-04-04 MED ORDER — MAGNESIUM OXIDE -MG SUPPLEMENT 200 MG PO TABS: 200.0000 | ORAL_TABLET | Freq: Two times a day (BID) | ORAL | 0 refills | Status: DC

## 2016-04-04 MED ORDER — DOCUSATE SODIUM 100 MG PO CAPS: 100.0000 mg | ORAL_CAPSULE | Freq: Two times a day (BID) | ORAL | 0 refills | Status: DC

## 2016-04-04 MED ORDER — POTASSIUM CHLORIDE CRYS ER 20 MEQ PO TBCR: 40.0000 meq | EXTENDED_RELEASE_TABLET | Freq: Once | ORAL | Status: DC

## 2016-04-04 MED ORDER — MAGNESIUM SULFATE 2 GM/50ML IV SOLN
2.0000 g | Freq: Once | INTRAVENOUS | Status: DC
  Filled 2016-04-04: qty 50

## 2016-04-04 NOTE — Discharge Summary (Signed)
Physician Discharge Summary  Zaccary Creech HBZ:[id number] DOB: 05/01/1941 DOA: 03/30/2016  PCP: [name], MD  Admit date: 03/30/2016 Discharge date: 04/04/2016  Recommendations for Outpatient Follow-up:  1. Pt will need to follow up with PCP in 1 week post discharge 2. Please obtain BMP to evaluate electrolytes and kidney function, please also note to check Mg level and K level 3. Both electrolytes have been supplemented prior to discharge but need close follow up 4. Pt advised to take MagOX for 5 days post discharge and if mg level stable, does not need further supplementation  5. Please also check CBC to evaluate Hg and Hct levels  Discharge Diagnoses:  Active Problems:   Hip fracture ([place])   Hypertension  Discharge Condition: Stable  Diet recommendation: Heart healthy diet discussed in details   Brief Narrative:  75 year old male with history of HLD, HTN, MDD, GERD, atrial fibrillation presented to ED after he was found by his family on the floor where he had been for about 2 days. He apparently had diarrhea and got up quickly go to the bathroom sustaining a fall onto his left hip. No history of LOC or head injury. He apparently stayed in the same position in his stools for extended period of time due to inability to move until his family found him. Imaging confirmed angulated fracture of left femoral neck. Orthopedics consulted and he underwent left hip hemiarthroplasty on 04/01/16.   Assessment & Plan:  1. Displaced left femoral neck fracture, status post left hip hemiarthroplasty 04/01/16: Sustained status post mechanical fall. Orthopedics was consulted and after preoperative clearance, underwent left hip hemiarthroplasty on 04/01/16. Management per orthopedics. Eliquis started postop. Doing well. PT recommends SNF. DC to SNF today.  2. Acute kidney injury: Likely related to dehydration from poor oral intake. Resolved after IV fluid hydration. 3. Mild rhabdomyolysis:  Presented with CK of 1192. This is improved to 650 after IV fluids and WNL on 11/25. 4. Essential hypertension: Continue metoprolol. 5. Hyperlipidemia: Resume statins at discharge. 6. GERD: Continue PPI. 7. Paroxysmal Atrial fibrillation: EKG 03/30/16 confirms sinus rhythm without acute changes. Continue metoprolol. Eliquis was held for surgery and resumed postop. Patient was seen by his primary cardiologist Dr. Burt Knack on 12/28/15. CHADS-Vasc = 2. Resume Eliquis on discharge.  8. Diarrhea: resolved. 9. Anemia/postop acute blood loss anemia: Not sure if he has chronic anemia. Initial hemoglobin of 14.6 may be falsely high due to hemoconcentration from dehydration. Mild hemoglobin drop from 11.7 > 10.4. Hemoglobin stable in the low 10 g range. No signs of active bleeding.  10. Thrombocytopenia: Stable, Plt now WNL 11. Elevated troponin: Most likely secondary to rhabdomyolysis. EKG without acute changes. Decreasing trend. No chest pain reported. 12. Hypokalemia/hypomagnesemia: Supplemented prior to discharge but needs follow up blood work to ensure stability. Please see above follow up recommendations.  13. Obesity - Body mass index is 31.87 kg/m.   DVT prophylaxis: Eliquis Code Status: Full Family Communication: None at bedside. Patient say he will update his family.  Disposition Plan: SNF    Consultants:   Orthopedics  Procedures:   Left hip hemiarthroplasty 04/01/16  Antimicrobials:   None   Procedures/Studies: Dg Chest 1 View  Result Date: 03/30/2016 CLINICAL DATA:  Fall. EXAM: CHEST 1 VIEW COMPARISON:  No prior. FINDINGS: Mediastinum and hilar structures are normal. Lungs are clear of acute infiltrates. Tiny calcified nodules left upper lobe consistent with tiny granulomas. Heart size normal. No pleural effusion or pneumothorax. No acute bony abnormality. Degenerative changes  thoracic spine . IMPRESSION: No acute abnormality. Electronically Signed   By: Marcello Moores  Register   On:  03/30/2016 11:34   Dg Elbow Complete Right  Result Date: 03/30/2016 CLINICAL DATA:  Fall. EXAM: RIGHT ELBOW - COMPLETE 3+ VIEW COMPARISON:  No recent prior. FINDINGS: No acute bony or joint abnormality identified. Small corticated densities noted adjacent to the radial epicondyle of the distal humerus most likely from prior injury. Diffuse degenerative change. IMPRESSION: No acute abnormality. Electronically Signed   By: Marcello Moores  Register   On: 03/30/2016 11:32   Pelvis Portable  Result Date: 04/01/2016 CLINICAL DATA:  Status post left hip replacement. EXAM: PORTABLE PELVIS 1-2 VIEWS COMPARISON:  03/30/2016 FINDINGS: Two frontal views of the left hip demonstrate placement of 3 component left hip prosthesis. The previously seen left femoral neck supra trochanteric fracture has been excised. No new fractures are seen. Normal alignment of the orthopedic hardware. Expected postsurgical soft tissue emphysema. IMPRESSION: Status post left total hip arthroplasty without evidence of immediate complications. Electronically Signed   By: Fidela Salisbury M.D.   On: 04/01/2016 14:20   Dg Knee Complete 4 Views Left  Result Date: 03/30/2016 CLINICAL DATA:  75 year old male with a history of fall and left leg pain EXAM: LEFT KNEE - COMPLETE 4+ VIEW COMPARISON:  None. FINDINGS: No acute displaced fracture. Degenerative changes of the knee. No focal soft tissue swelling. Evaluation of joint effusion not optimized secondary to rotation on the lateral view. IMPRESSION: No evidence of acute displaced fracture at the knee. Signed, Dulcy Fanny. Earleen Newport, DO [facility] Radiology Electronically Signed   By: Corrie Mckusick D.O.   On: 03/30/2016 11:33   Dg Hip Unilat W Or Wo Pelvis 2-3 Views Left  Result Date: 03/30/2016 CLINICAL DATA:  Fall. EXAM: DG HIP (WITH OR WITHOUT PELVIS) 2-3V LEFT COMPARISON:  No recent prior. FINDINGS: Degenerative changes lumbar spine and both hips.  Angulated fracture of the left femoral neck is noted. No other focal bony abnormalities identified. IMPRESSION: Angulated fracture of the left femoral neck. Electronically Signed   By: Marcello Moores  Register   On: 03/30/2016 11:31   Discharge Exam: Vitals:   04/03/16 2037 04/04/16 0605  BP: (!) 156/72 110/67  Pulse: 80 100  Resp: 16 16  Temp: 99.6 F (37.6 C) 98.4 F (36.9 C)   Vitals:   04/03/16 0519 04/03/16 1454 04/03/16 2037 04/04/16 0605  BP: (!) 145/69 138/74 (!) 156/72 110/67  Pulse: 71 72 80 100  Resp: 16 16 16 16   Temp: 98.7 F (37.1 C) 98.6 F (37 C) 99.6 F (37.6 C) 98.4 F (36.9 C)  TempSrc: Oral Oral Oral Oral  SpO2: 99% 98% 99% 99%  Weight:      Height:        General: Pt is alert, follows commands appropriately, not in acute distress Cardiovascular: Regular rate and rhythm, no rubs, no gallops Respiratory: Clear to auscultation bilaterally, no wheezing, no crackles, no rhonchi Abdominal: Soft, non tender, non distended, bowel sounds +, no guarding  Discharge Instructions  Discharge Instructions    Diet - low sodium heart healthy    Complete by:  As directed    Increase activity slowly    Complete by:  As directed    Weight bearing as tolerated    Complete by:  As directed        Medication List    TAKE these medications   atorvastatin 20 MG tablet Commonly known as:  LIPITOR Take 20  mg by mouth daily.   b complex vitamins capsule Take 1 capsule by mouth daily.   cetirizine 10 MG tablet Commonly known as:  ZYRTEC Take 10 mg by mouth daily.   docusate sodium 100 MG capsule Commonly known as:  COLACE Take 1 capsule (100 mg total) by mouth 2 (two) times daily.   ELIQUIS 5 MG Tabs tablet Generic drug:  apixaban Take 5 mg by mouth 2 (two) times daily.   escitalopram 10 MG tablet Commonly known as:  LEXAPRO Take 10 mg by mouth daily.   ferrous sulfate 325 (65 FE) MG EC tablet Take 325 mg by mouth daily with breakfast.   folic acid 1 MG  tablet Commonly known as:  FOLVITE Take 1 mg by mouth daily.   HYDROcodone-acetaminophen 5-325 MG tablet Commonly known as:  NORCO/VICODIN Take 1-2 tablets by mouth every 4 (four) hours as needed for moderate pain.   Magnesium Oxide 200 MG Tabs Take 200 tablets (40,000 mg total) by mouth 2 (two) times daily.   metoprolol 200 MG 24 hr tablet Commonly known as:  TOPROL-XL Take 200 mg by mouth daily.   multivitamin capsule Take 1 capsule by mouth daily.   pantoprazole 40 MG tablet Commonly known as:  PROTONIX Take 40 mg by mouth daily.   traZODone 50 MG tablet Commonly known as:  DESYREL Take 50 mg by mouth at bedtime.       Follow-up Information    [name], MD Follow up in 2 week(s).   Specialty:  Orthopedic Surgery Why:  for wound check Contact information: [address] [place] [address] [phone]        [name], MD Follow up.   Specialty:  Internal Medicine Contact information: [place] [address] [phone]            The results of significant diagnostics from this hospitalization (including imaging, microbiology, ancillary and laboratory) are listed below for reference.     Microbiology: Recent Results (from the past 240 hour(s))  Urine culture     Status: Abnormal   Collection Time: 03/30/16 12:53 PM  Result Value Ref Range Status   Specimen Description URINE, CLEAN CATCH  Final   Special Requests NONE  Final   Culture MULTIPLE SPECIES PRESENT, SUGGEST RECOLLECTION (A)  Final   Report Status 03/31/2016 FINAL  Final  Culture, blood (Routine X 2) w Reflex to ID Panel     Status: None (Preliminary result)   Collection Time: 03/30/16  3:14 PM  Result Value Ref Range Status   Specimen Description BLOOD LEFT ARM  Final   Special Requests BOTTLES DRAWN AEROBIC AND ANAEROBIC 5CC EACH  Final   Culture NO GROWTH 4 DAYS  Final   Report Status PENDING  Incomplete  Culture, blood (Routine X  2) w Reflex to ID Panel     Status: None (Preliminary result)   Collection Time: 03/30/16  3:19 PM  Result Value Ref Range Status   Specimen Description BLOOD LEFT ARM  Final   Special Requests BOTTLES DRAWN AEROBIC AND ANAEROBIC 5CC EACH  Final   Culture NO GROWTH 4 DAYS  Final   Report Status PENDING  Incomplete  MRSA PCR Screening     Status: None   Collection Time: 03/31/16  4:23 AM  Result Value Ref Range Status   MRSA by PCR NEGATIVE NEGATIVE Final    Comment:        The GeneXpert MRSA Assay (FDA approved for  NASAL specimens only), is one component of a comprehensive MRSA colonization surveillance program. It is not intended to diagnose MRSA infection nor to guide or monitor treatment for MRSA infections.      Labs: Basic Metabolic Panel:  Recent Labs Lab 03/31/16 0416 04/01/16 0350 04/02/16 0400 04/03/16 0430 04/03/16 0833 04/04/16 0626  NA 138 138 136 139  --  139  K 3.5 3.4* 3.3* 3.3*  --  3.4*  CL 106 105 104 105  --  106  CO2 24 24 23 25   --  24  GLUCOSE 109* 92 123* 97  --  106*  BUN 13 11 12 12   --  10  CREATININE 1.16 1.14 1.01 1.08  --  1.09  CALCIUM 8.2* 8.4* 8.1* 8.4*  --  8.7*  MG  --   --   --   --  1.4* 1.4*   Liver Function Tests:  Recent Labs Lab 03/30/16 1040 03/31/16 0416 04/04/16 0626  AST 85* 63* 40  ALT 54 42 33  ALKPHOS 119 104 78  BILITOT 2.1* 1.1 1.0  PROT 6.7 5.3* 5.5*  ALBUMIN 3.8 3.0* 2.7*   CBC:  Recent Labs Lab 03/30/16 1040 03/31/16 0416 04/01/16 0350 04/02/16 0400 04/03/16 0430 04/04/16 0626  WBC 19.6* 12.4* 12.0* 12.0* 10.4 10.0  NEUTROABS 16.9*  --   --   --   --   --   HGB 14.6 11.7* 11.7* 10.4* 10.2* 10.4*  HCT 41.8 34.6* 35.5* 30.0* 29.7* 30.8*  MCV 93.3 95.8 96.5 93.5 94.6 94.8  PLT 161 119* 107* 121* 133* 176   Cardiac Enzymes:  Recent Labs Lab 03/30/16 1040 03/30/16 1350 03/30/16 1915 03/31/16 0128 03/31/16 0416 04/02/16 0400  CKTOTAL 1,192*  --   --   --  650* 308  TROPONINI  --  0.08*  0.05* 0.04*  --   --    CBG:  Recent Labs Lab 03/31/16 1156  GLUCAP 110*   SIGNED: Time coordinating discharge: 30 minutes  MAGICK-Larkin Alfred, MD  [facility] 04/04/2016, 9:44 AM Pager [phone]  If 7PM-7AM, please contact night-coverage [email or web address] Password [email or web address]

## 2016-04-04 NOTE — Discharge Instructions (Signed)
Maintain surgical dressing until follow up WBAT LLE    Hip Fracture A hip fracture is a fracture of the upper part of your thigh bone (femur). What are the causes? A hip fracture is caused by a direct blow to the side of your hip. This is usually the result of a fall but can occur in other circumstances, such as an automobile accident. What increases the risk? There is an increased risk of hip fractures in people with:  An unsteady walking pattern (gait) and those with conditions that contribute to poor balance, such as Parkinson's disease or dementia.  Osteopenia and osteoporosis.  Cancer that spreads to the leg bones.  Certain metabolic diseases. What are the signs or symptoms? Symptoms of hip fracture include:  Pain over the injured hip.  Inability to put weight on the leg in which the fracture occurred (although, some patients are able to walk after a hip fracture).  Toes and foot of the affected leg point outward when you lie down. How is this diagnosed? A physical exam can determine if a hip fracture is likely to have occurred. X-ray exams are needed to confirm the fracture and to look for other injuries. The X-ray exam can help to determine the type of hip fracture. Rarely, the fracture is not visible on an X-ray image and a CT scan or MRI will have to be done. How is this treated? The treatment for a fracture is usually surgery. This means using a screw, nail, or rod to hold the bones in place. Follow these instructions at home: Take all medicines as directed by your health care provider. Contact a health care provider if: Pain continues, even after taking pain medicine. This information is not intended to replace advice given to you by your health care provider. Make sure you discuss any questions you have with your health care provider. Document Released: 04/25/2005 Document Revised: 10/01/2015 Document Reviewed: 12/05/2012 Elsevier Interactive Patient Education  2017  [employer].

## 2016-04-04 NOTE — Progress Notes (Signed)
Patient ID: Darryl Diaz, male   DOB: 1940/11/02, 75 y.o.   MRN: [id number]  Subjective: 3 Days Post-Op Procedure(s) (LRB): ARTHROPLASTY BIPOLAR HIP (HEMIARTHROPLASTY) (Left)    Patient reports pain as none.  Doing well especially compared to his pre-operative pain level  Objective:   VITALS:   Vitals:   04/03/16 2037 04/04/16 0605  BP: (!) 156/72 110/67  Pulse: 80 100  Resp: 16 16  Temp: 99.6 F (37.6 C) 98.4 F (36.9 C)    Neurovascular intact Incision: dressing C/D/I  LABS  Recent Labs  04/02/16 0400 04/03/16 0430 04/04/16 0626  HGB 10.4* 10.2* 10.4*  HCT 30.0* 29.7* 30.8*  WBC 12.0* 10.4 10.0  PLT 121* 133* 176     Recent Labs  04/02/16 0400 04/03/16 0430 04/04/16 0626  NA 136 139 139  K 3.3* 3.3* 3.4*  BUN 12 12 10   CREATININE 1.01 1.08 1.09  GLUCOSE 123* 97 106*    No results for input(s): LABPT, INR in the last 72 hours.   Assessment/Plan: 3 Days Post-Op Procedure(s) (LRB): ARTHROPLASTY BIPOLAR HIP (HEMIARTHROPLASTY) (Left)   Up with therapy Discharge to SNF today if able RTC in 2 weeks

## 2016-04-04 NOTE — Progress Notes (Signed)
Report called to [facility]. Patient stable for transport

## 2016-04-04 NOTE — Clinical Social Work Placement (Signed)
   CLINICAL SOCIAL WORK PLACEMENT  NOTE  Date:  04/04/2016  Patient Details  Name: Darryl Diaz MRN: [id number] Date of Birth: 03-11-1941  Clinical Social Work is seeking post-discharge placement for this patient at the [place] level of care (*CSW will initial, date and re-position this form in  chart as items are completed):      Patient/family provided with [place] Work Department's list of facilities offering this level of care within the geographic area requested by the patient (or if unable, by the patient's family).  Yes   Patient/family informed of their freedom to choose among providers that offer the needed level of care, that participate in Medicare, Medicaid or managed care program needed by the patient, have an available bed and are willing to accept the patient.      Patient/family informed of [facility]'s ownership interest in [facility] and [facility], as well as of the fact that they are under no obligation to receive care at these facilities.  PASRR submitted to EDS on       PASRR number received on       Existing PASRR number confirmed on 04/02/16     FL2 transmitted to all facilities in geographic area requested by pt/family on 04/02/16     FL2 transmitted to all facilities within larger geographic area on 04/02/16     Patient informed that his/her managed care company has contracts with or will negotiate with certain facilities, including the following:        Yes   Patient/family informed of bed offers received.  Patient chooses bed at [facility] at [place] recommends and patient chooses bed at      Patient to be transferred to [facility] at [place] on 04/04/16.  Patient to be transferred to facility by ptar     Patient family notified on 04/04/16 of transfer.  Name of family member notified:  at bedside     PHYSICIAN Please sign FL2     Additional Comment:     _______________________________________________ Jorge Ny, LCSW 04/04/2016, 10:46 AM

## 2016-04-04 NOTE — Progress Notes (Signed)
Patient will discharge to [facility] Anticipated discharge date: 11/27 Family notified: at bedside Transportation by [facility]- called at 10:45am  CSW signing off.  Jorge Ny, LCSW Clinical Social Worker [phone]

## 2016-04-04 NOTE — Care Management Important Message (Signed)
Important Message  Patient Details  Name: Darryl Diaz MRN: [id number] Date of Birth: 04/23/1941   Medicare Important Message Given:  Yes    [name] May 04/04/2016, 12:01 PM

## 2016-04-06 DIAGNOSIS — R262 Difficulty in walking, not elsewhere classified: Secondary | ICD-10-CM | POA: Diagnosis not present

## 2016-04-06 DIAGNOSIS — S72002D Fracture of unspecified part of neck of left femur, subsequent encounter for closed fracture with routine healing: Secondary | ICD-10-CM | POA: Diagnosis not present

## 2016-04-06 DIAGNOSIS — M6282 Rhabdomyolysis: Secondary | ICD-10-CM | POA: Diagnosis not present

## 2016-04-06 DIAGNOSIS — I4891 Unspecified atrial fibrillation: Secondary | ICD-10-CM | POA: Diagnosis not present

## 2016-04-11 DIAGNOSIS — I10 Essential (primary) hypertension: Secondary | ICD-10-CM | POA: Diagnosis not present

## 2016-04-11 DIAGNOSIS — S72042D Displaced fracture of base of neck of left femur, subsequent encounter for closed fracture with routine healing: Secondary | ICD-10-CM | POA: Diagnosis not present

## 2016-04-11 DIAGNOSIS — I480 Paroxysmal atrial fibrillation: Secondary | ICD-10-CM | POA: Diagnosis not present

## 2016-04-11 DIAGNOSIS — M160 Bilateral primary osteoarthritis of hip: Secondary | ICD-10-CM | POA: Diagnosis not present

## 2016-04-11 DIAGNOSIS — Z9181 History of falling: Secondary | ICD-10-CM | POA: Diagnosis not present

## 2016-04-11 DIAGNOSIS — M5135 Other intervertebral disc degeneration, thoracolumbar region: Secondary | ICD-10-CM | POA: Diagnosis not present

## 2016-04-13 DIAGNOSIS — M160 Bilateral primary osteoarthritis of hip: Secondary | ICD-10-CM | POA: Diagnosis not present

## 2016-04-13 DIAGNOSIS — S72042D Displaced fracture of base of neck of left femur, subsequent encounter for closed fracture with routine healing: Secondary | ICD-10-CM | POA: Diagnosis not present

## 2016-04-13 DIAGNOSIS — I480 Paroxysmal atrial fibrillation: Secondary | ICD-10-CM | POA: Diagnosis not present

## 2016-04-13 DIAGNOSIS — I10 Essential (primary) hypertension: Secondary | ICD-10-CM | POA: Diagnosis not present

## 2016-04-13 DIAGNOSIS — M5135 Other intervertebral disc degeneration, thoracolumbar region: Secondary | ICD-10-CM | POA: Diagnosis not present

## 2016-04-13 DIAGNOSIS — Z9181 History of falling: Secondary | ICD-10-CM | POA: Diagnosis not present

## 2016-04-15 DIAGNOSIS — M160 Bilateral primary osteoarthritis of hip: Secondary | ICD-10-CM | POA: Diagnosis not present

## 2016-04-15 DIAGNOSIS — S72042D Displaced fracture of base of neck of left femur, subsequent encounter for closed fracture with routine healing: Secondary | ICD-10-CM | POA: Diagnosis not present

## 2016-04-15 DIAGNOSIS — I10 Essential (primary) hypertension: Secondary | ICD-10-CM | POA: Diagnosis not present

## 2016-04-15 DIAGNOSIS — Z9181 History of falling: Secondary | ICD-10-CM | POA: Diagnosis not present

## 2016-04-15 DIAGNOSIS — I480 Paroxysmal atrial fibrillation: Secondary | ICD-10-CM | POA: Diagnosis not present

## 2016-04-15 DIAGNOSIS — Z96642 Presence of left artificial hip joint: Secondary | ICD-10-CM | POA: Diagnosis not present

## 2016-04-15 DIAGNOSIS — M5135 Other intervertebral disc degeneration, thoracolumbar region: Secondary | ICD-10-CM | POA: Diagnosis not present

## 2016-04-15 DIAGNOSIS — Z471 Aftercare following joint replacement surgery: Secondary | ICD-10-CM | POA: Diagnosis not present

## 2016-04-18 DIAGNOSIS — Z9181 History of falling: Secondary | ICD-10-CM | POA: Diagnosis not present

## 2016-04-18 DIAGNOSIS — M160 Bilateral primary osteoarthritis of hip: Secondary | ICD-10-CM | POA: Diagnosis not present

## 2016-04-18 DIAGNOSIS — S72042D Displaced fracture of base of neck of left femur, subsequent encounter for closed fracture with routine healing: Secondary | ICD-10-CM | POA: Diagnosis not present

## 2016-04-18 DIAGNOSIS — I10 Essential (primary) hypertension: Secondary | ICD-10-CM | POA: Diagnosis not present

## 2016-04-18 DIAGNOSIS — M5135 Other intervertebral disc degeneration, thoracolumbar region: Secondary | ICD-10-CM | POA: Diagnosis not present

## 2016-04-18 DIAGNOSIS — I480 Paroxysmal atrial fibrillation: Secondary | ICD-10-CM | POA: Diagnosis not present

## 2016-04-18 DIAGNOSIS — N179 Acute kidney failure, unspecified: Secondary | ICD-10-CM | POA: Diagnosis not present

## 2016-04-22 DIAGNOSIS — I480 Paroxysmal atrial fibrillation: Secondary | ICD-10-CM | POA: Diagnosis not present

## 2016-04-22 DIAGNOSIS — M5135 Other intervertebral disc degeneration, thoracolumbar region: Secondary | ICD-10-CM | POA: Diagnosis not present

## 2016-04-22 DIAGNOSIS — I10 Essential (primary) hypertension: Secondary | ICD-10-CM | POA: Diagnosis not present

## 2016-04-22 DIAGNOSIS — S72042D Displaced fracture of base of neck of left femur, subsequent encounter for closed fracture with routine healing: Secondary | ICD-10-CM | POA: Diagnosis not present

## 2016-04-22 DIAGNOSIS — M160 Bilateral primary osteoarthritis of hip: Secondary | ICD-10-CM | POA: Diagnosis not present

## 2016-04-22 DIAGNOSIS — Z9181 History of falling: Secondary | ICD-10-CM | POA: Diagnosis not present

## 2016-04-29 ENCOUNTER — Inpatient Hospital Stay (HOSPITAL_COMMUNITY)
Admission: EM | Admit: 2016-04-29 | Discharge: 2016-05-04 | DRG: 561 | Disposition: A | Discharge status: Skilled Nursing Facility | Payer: Medicare Other | Attending: Orthopedic Surgery | Admitting: Orthopedic Surgery

## 2016-04-29 ENCOUNTER — Emergency Department (HOSPITAL_COMMUNITY): Payer: Medicare Other

## 2016-04-29 ENCOUNTER — Encounter (HOSPITAL_COMMUNITY): Payer: Self-pay | Admitting: Emergency Medicine

## 2016-04-29 DIAGNOSIS — Z87891 Personal history of nicotine dependence: Secondary | ICD-10-CM

## 2016-04-29 DIAGNOSIS — S73015A Posterior dislocation of left hip, initial encounter: Secondary | ICD-10-CM

## 2016-04-29 DIAGNOSIS — S73005A Unspecified dislocation of left hip, initial encounter: Secondary | ICD-10-CM | POA: Diagnosis not present

## 2016-04-29 DIAGNOSIS — G4700 Insomnia, unspecified: Secondary | ICD-10-CM | POA: Diagnosis present

## 2016-04-29 DIAGNOSIS — Y792 Prosthetic and other implants, materials and accessory orthopedic devices associated with adverse incidents: Secondary | ICD-10-CM | POA: Diagnosis present

## 2016-04-29 DIAGNOSIS — Z880 Allergy status to penicillin: Secondary | ICD-10-CM

## 2016-04-29 DIAGNOSIS — S73016A Posterior dislocation of unspecified hip, initial encounter: Secondary | ICD-10-CM | POA: Diagnosis present

## 2016-04-29 DIAGNOSIS — Z823 Family history of stroke: Secondary | ICD-10-CM

## 2016-04-29 DIAGNOSIS — I480 Paroxysmal atrial fibrillation: Secondary | ICD-10-CM | POA: Diagnosis present

## 2016-04-29 DIAGNOSIS — T84021A Dislocation of internal left hip prosthesis, initial encounter: Secondary | ICD-10-CM | POA: Diagnosis not present

## 2016-04-29 DIAGNOSIS — T148XXA Other injury of unspecified body region, initial encounter: Secondary | ICD-10-CM | POA: Diagnosis not present

## 2016-04-29 DIAGNOSIS — M25552 Pain in left hip: Secondary | ICD-10-CM | POA: Diagnosis not present

## 2016-04-29 DIAGNOSIS — Z9889 Other specified postprocedural states: Secondary | ICD-10-CM

## 2016-04-29 DIAGNOSIS — I10 Essential (primary) hypertension: Secondary | ICD-10-CM | POA: Diagnosis present

## 2016-04-29 DIAGNOSIS — Z8261 Family history of arthritis: Secondary | ICD-10-CM

## 2016-04-29 DIAGNOSIS — F1021 Alcohol dependence, in remission: Secondary | ICD-10-CM | POA: Diagnosis present

## 2016-04-29 DIAGNOSIS — Z8249 Family history of ischemic heart disease and other diseases of the circulatory system: Secondary | ICD-10-CM

## 2016-04-29 DIAGNOSIS — K219 Gastro-esophageal reflux disease without esophagitis: Secondary | ICD-10-CM | POA: Diagnosis present

## 2016-04-29 MED ORDER — FENTANYL CITRATE (PF) 100 MCG/2ML IJ SOLN: 25.0000 ug | Freq: Once | INTRAMUSCULAR | Status: DC

## 2016-04-29 NOTE — ED Notes (Signed)
Placed pt on 2 liters of o2 due to pt sats 85% once o2 placed on pt sats went up to 93% no complaints noted at this time

## 2016-04-29 NOTE — ED Notes (Signed)
RT notified of impending conscious sedation.

## 2016-04-29 NOTE — ED Triage Notes (Signed)
Pt transported from home by EMS after fall @ 2045, pt states he twisted wrong with attmpeted to get into bed. L hip sx 11.24.17. IV est by EMS, Fentanyl 124mcg given. L hip/leg shortened and rotated inward, pedal pulses difficult to palpate per EMS d/t edema.No other injuries noted.

## 2016-04-29 NOTE — ED Provider Notes (Signed)
[facility] DEPT Provider Note   CSN: [id number] Arrival date & time: 04/29/16  2155     History   Chief Complaint Chief Complaint  Patient presents with  . Hip Pain    HPI Karston Hyland is a 75 y.o. male.  HPI  75 year old male who presents with left hip pain. History of left hip hemiarthroplasty by Dr. Alvan Dame on 04/01/2016. Also history of PAF on Eliquis. Per EMS, patient turning over in bed tonight and had sudden onset of left hip pain. No fall. Patient received 100 mcg fentanyl prior to arrival. No numbness or weakness. No LOC.   Past Medical History:  Diagnosis Date  . 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 Atrial fibrillation ([place])   . Fall at home 03/28/2016  . GERD (gastroesophageal reflux disease)   . H/O seasonal allergies   . Hyperlipidemia   . Hypertension   . Insomnia   . 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 OA (osteoarthritis)    hands    Patient Active Problem List   Diagnosis Date Noted  . Hip fracture ([place]) 03/30/2016  . Hypertension 03/30/2016  . Hyperlipidemia 03/30/2016  . GERD (gastroesophageal reflux disease) 03/30/2016  . Osteoarthritis 03/30/2016  . Depression 03/30/2016  . Rhabdomyolysis 03/30/2016  . Leukocytosis 03/30/2016  . Pressure injury of skin 03/30/2016  . Closed fracture of left hip [facility])     Past Surgical History:  Procedure Laterality Date  . ANKLE SURGERY Left 2014   had rods put in   . HIP ARTHROPLASTY Left 04/01/2016   Procedure: ARTHROPLASTY BIPOLAR HIP (HEMIARTHROPLASTY);  Surgeon: Paralee Cancel, MD;  Location: [facility];  Service: Orthopedics;  Laterality: Left;  . TONSILLECTOMY         Home Medications    Prior to Admission medications   Medication Sig Start Date End Date Taking? Authorizing Provider  apixaban (ELIQUIS) 5 MG TABS tablet Take 5 mg by mouth 2 (two) times daily.    Historical Provider, MD  atorvastatin (LIPITOR) 20 MG tablet Take 20 mg by mouth daily.     Historical Provider, MD  b complex vitamins capsule Take 1 capsule by mouth daily.    Historical Provider, MD  cetirizine (ZYRTEC) 10 MG tablet Take 10 mg by mouth daily.    Historical Provider, MD  docusate sodium (COLACE) 100 MG capsule Take 1 capsule (100 mg total) by mouth 2 (two) times daily. 04/04/16   Theodis Blaze, MD  escitalopram (LEXAPRO) 10 MG tablet Take 10 mg by mouth daily.    Historical Provider, MD  ferrous sulfate 325 (65 FE) MG EC tablet Take 325 mg by mouth daily with breakfast.    Historical Provider, MD  folic acid (FOLVITE) 1 MG tablet Take 1 mg by mouth daily.    Historical Provider, MD  HYDROcodone-acetaminophen (NORCO/VICODIN) 5-325 MG tablet Take 1-2 tablets by mouth every 4 (four) hours as needed for moderate pain. 04/04/16   Paralee Cancel, MD  Magnesium Oxide 200 MG TABS Take 200 tablets (40,000 mg total) by mouth 2 (two) times daily. 04/04/16   Theodis Blaze, MD  metoprolol (TOPROL-XL) 200 MG 24 hr tablet Take 200 mg by mouth daily.    Historical Provider, MD  Multiple Vitamin (MULTIVITAMIN) capsule Take 1 capsule by mouth daily.    Historical Provider, MD  pantoprazole (PROTONIX) 40 MG tablet Take 40 mg by mouth daily.    Historical Provider, MD  traZODone (DESYREL)  50 MG tablet Take 50 mg by mouth at bedtime.    Historical Provider, MD    Family History Family History  Problem Relation Age of Onset  . Heart disease Mother 70  . Hypertension Mother   . Arthritis Mother   . Heart failure Father 87  . Stroke Maternal Aunt   . Heart failure Sister     Social History Social History  Substance Use Topics  . Smoking status: Former Research scientist (life sciences)  . Smokeless tobacco: Never Used  . Alcohol use Yes     Comment: 03/2016   i HAD ONE DRINK & THAT WAS THE FIRST DRINK i HAD IN A LONG TIME      Allergies   Penicillins   Review of Systems Review of Systems 10/14 systems reviewed and are negative other than those stated in the HPI   Physical Exam Updated Vital  Signs BP 115/58   Pulse 69   Temp 98.8 F (37.1 C) (Oral)   Resp 12   SpO2 100%   Physical Exam Physical Exam  Nursing note and vitals reviewed. Constitutional: non-toxic, and in no acute distress Head: Normocephalic and atraumatic.  Mouth/Throat: Oropharynx is clear and moist.  Neck: Normal range of motion. Neck supple.  Cardiovascular: Normal rate and regular rhythm.  +2 DP pulses bilaterally Pulmonary/Chest: Effort normal and breath sounds normal.  Abdominal: Soft. There is no tenderness. There is no rebound and no guarding.  Musculoskeletal: Limited ROM of the left hip due to pain, soft tissue swelling over left hip  Neurological: Alert, no facial droop, fluent speech, sensation to light touch in tact in bilateral lower extremities. Full strength ankle dorsi/plantarflexion bilaterally Skin: Skin is warm and dry.  Psychiatric: Cooperative   ED Treatments / Results  Labs (all labs ordered are listed, but only abnormal results are displayed) Labs Reviewed - No data to display  EKG  EKG Interpretation None       Radiology Dg Hip Port Unilat W Or Wo Pelvis 1 View Left  Result Date: 04/30/2016 CLINICAL DATA:  Left hip dislocation EXAM: DG HIP (WITH OR WITHOUT PELVIS) 1V PORT LEFT COMPARISON:  Left hip radiograph 04/29/2016 FINDINGS: The dislocated left hip hemiarthroplasty is in unchanged position, with persistent superior and lateral displacement. IMPRESSION: Unchanged appearance of dislocated left hip hemiarthroplasty. Electronically Signed   By: Ulyses Jarred M.D.   On: 04/30/2016 01:25   Dg Hip Port Unilat W Or Wo Pelvis 1 View Left  Result Date: 04/29/2016 CLINICAL DATA:  [place] lead tip pain 1 day ago. EXAM: DG HIP (WITH OR WITHOUT PELVIS) 1V PORT LEFT COMPARISON:  None. FINDINGS: Portable images demonstrate dislocation of the left hip bipolar hemiarthroplasty. No fractures are evident on these limited views. IMPRESSION: Left hip dislocation. Electronically Signed   By:  Andreas Newport M.D.   On: 04/29/2016 23:46    Procedures Procedures (including critical care time) Procedural sedation Performed by: Forde Dandy Consent: Verbal consent obtained. Risks and benefits: risks, benefits and alternatives were discussed Required items: required blood products, implants, devices, and special equipment available Patient identity confirmed: arm band and provided demographic data Time out: Immediately prior to procedure a "time out" was called to verify the correct patient, procedure, equipment, support staff and site/side marked as required.  Sedation type: moderate (conscious) sedation NPO time confirmed and considedered  Sedatives: PROPOFOL  Physician Time at Bedside: 35 minutes  Vitals: Vital signs were monitored during sedation. Cardiac Monitor, pulse oximeter Patient tolerance: Patient tolerated the procedure  well with no immediate complications. Comments: Pt with uneventful recovered. Returned to pre-procedural sedation baseline   Reduction of dislocation Date/Time: 1:34 AM Performed by: Forde Dandy Authorized by: Forde Dandy Consent: Verbal consent obtained. Risks and benefits: risks, benefits and alternatives were discussed Consent given by: patient Required items: required blood products, implants, devices, and special equipment available Time out: Immediately prior to procedure a "time out" was called to verify the correct patient, procedure, equipment, support staff and site/side marked as required.  Patient sedated: with propofol  Vitals: Vital signs were monitored during sedation. Patient tolerance: Patient tolerated the procedure well with no immediate complications., unable to reduce the left hip Joint: left hip Reduction technique: traction/countertraction   Medications Ordered in ED Medications  propofol (DIPRIVAN) 10 mg/mL bolus/IV push 53 mg (53 mg Intravenous Given 04/30/16 0031)  propofol (DIPRIVAN) 10 mg/mL bolus/IV  push (25 mg Intravenous Given 04/30/16 0055)     Initial Impression / Assessment and Plan / ED Course  I have reviewed the triage vital signs and the nursing notes.  Pertinent labs & imaging results that were available during my care of the patient were reviewed by me and considered in my medical decision making (see chart for details).  Clinical Course     X-ray visualized and shows left hip dislocation. Extremity is neurovascularly intact. Multiple reduction attempts for left hip dislocation attempted without success. Patient very sensitive with propofol causing apnea at even small doses, and difficult to fully relax. Discussed with Dr. Doran Durand who is on-call for Dr. Alvan Dame. He will come to ED to evaluation and potentially reduce under anesthesia.  Final Clinical Impressions(s) / ED Diagnoses   Final diagnoses:  Dislocation of left hip, initial encounter [facility])    New Prescriptions New Prescriptions   No medications on file     Forde Dandy, MD 04/30/16 [phone]

## 2016-04-30 ENCOUNTER — Encounter (HOSPITAL_COMMUNITY)
Admission: EM | Disposition: A | Discharge status: Skilled Nursing Facility | Payer: Self-pay | Source: Home / Self Care | Attending: Orthopedic Surgery

## 2016-04-30 ENCOUNTER — Emergency Department (HOSPITAL_COMMUNITY): Payer: Medicare Other | Admitting: Certified Registered"

## 2016-04-30 ENCOUNTER — Emergency Department (HOSPITAL_COMMUNITY): Payer: Medicare Other

## 2016-04-30 ENCOUNTER — Encounter (HOSPITAL_COMMUNITY): Payer: Self-pay | Admitting: Certified Registered"

## 2016-04-30 DIAGNOSIS — I480 Paroxysmal atrial fibrillation: Secondary | ICD-10-CM | POA: Diagnosis present

## 2016-04-30 DIAGNOSIS — Z87891 Personal history of nicotine dependence: Secondary | ICD-10-CM | POA: Diagnosis not present

## 2016-04-30 DIAGNOSIS — E785 Hyperlipidemia, unspecified: Secondary | ICD-10-CM | POA: Diagnosis not present

## 2016-04-30 DIAGNOSIS — Z96642 Presence of left artificial hip joint: Secondary | ICD-10-CM | POA: Diagnosis not present

## 2016-04-30 DIAGNOSIS — T84021A Dislocation of internal left hip prosthesis, initial encounter: Secondary | ICD-10-CM | POA: Diagnosis not present

## 2016-04-30 DIAGNOSIS — Y792 Prosthetic and other implants, materials and accessory orthopedic devices associated with adverse incidents: Secondary | ICD-10-CM | POA: Diagnosis present

## 2016-04-30 DIAGNOSIS — Z9181 History of falling: Secondary | ICD-10-CM | POA: Diagnosis not present

## 2016-04-30 DIAGNOSIS — F1021 Alcohol dependence, in remission: Secondary | ICD-10-CM | POA: Diagnosis present

## 2016-04-30 DIAGNOSIS — T84021D Dislocation of internal left hip prosthesis, subsequent encounter: Secondary | ICD-10-CM | POA: Diagnosis not present

## 2016-04-30 DIAGNOSIS — S73016A Posterior dislocation of unspecified hip, initial encounter: Secondary | ICD-10-CM | POA: Diagnosis present

## 2016-04-30 DIAGNOSIS — T84022A Instability of internal right knee prosthesis, initial encounter: Secondary | ICD-10-CM | POA: Diagnosis not present

## 2016-04-30 DIAGNOSIS — S72009A Fracture of unspecified part of neck of unspecified femur, initial encounter for closed fracture: Secondary | ICD-10-CM | POA: Diagnosis not present

## 2016-04-30 DIAGNOSIS — R262 Difficulty in walking, not elsewhere classified: Secondary | ICD-10-CM | POA: Diagnosis not present

## 2016-04-30 DIAGNOSIS — K219 Gastro-esophageal reflux disease without esophagitis: Secondary | ICD-10-CM | POA: Diagnosis not present

## 2016-04-30 DIAGNOSIS — Z9889 Other specified postprocedural states: Secondary | ICD-10-CM | POA: Diagnosis not present

## 2016-04-30 DIAGNOSIS — G4700 Insomnia, unspecified: Secondary | ICD-10-CM | POA: Diagnosis present

## 2016-04-30 DIAGNOSIS — F329 Major depressive disorder, single episode, unspecified: Secondary | ICD-10-CM | POA: Diagnosis not present

## 2016-04-30 DIAGNOSIS — S73015A Posterior dislocation of left hip, initial encounter: Secondary | ICD-10-CM | POA: Diagnosis present

## 2016-04-30 DIAGNOSIS — M6282 Rhabdomyolysis: Secondary | ICD-10-CM | POA: Diagnosis not present

## 2016-04-30 DIAGNOSIS — S73005A Unspecified dislocation of left hip, initial encounter: Secondary | ICD-10-CM | POA: Diagnosis present

## 2016-04-30 DIAGNOSIS — Z8249 Family history of ischemic heart disease and other diseases of the circulatory system: Secondary | ICD-10-CM | POA: Diagnosis not present

## 2016-04-30 DIAGNOSIS — R531 Weakness: Secondary | ICD-10-CM | POA: Diagnosis not present

## 2016-04-30 DIAGNOSIS — Z823 Family history of stroke: Secondary | ICD-10-CM | POA: Diagnosis not present

## 2016-04-30 DIAGNOSIS — I4891 Unspecified atrial fibrillation: Secondary | ICD-10-CM | POA: Diagnosis not present

## 2016-04-30 DIAGNOSIS — M1990 Unspecified osteoarthritis, unspecified site: Secondary | ICD-10-CM | POA: Diagnosis not present

## 2016-04-30 DIAGNOSIS — I10 Essential (primary) hypertension: Secondary | ICD-10-CM | POA: Diagnosis not present

## 2016-04-30 DIAGNOSIS — Z880 Allergy status to penicillin: Secondary | ICD-10-CM | POA: Diagnosis not present

## 2016-04-30 DIAGNOSIS — Z8261 Family history of arthritis: Secondary | ICD-10-CM | POA: Diagnosis not present

## 2016-04-30 HISTORY — PX: HIP CLOSED REDUCTION: SHX983

## 2016-04-30 HISTORY — DX: Posterior dislocation of unspecified hip, initial encounter: S73.016A

## 2016-04-30 SURGERY — CLOSED REDUCTION, HIP
Anesthesia: General | Laterality: Left

## 2016-04-30 MED ORDER — OXYCODONE HCL 5 MG PO TABS
5.0000 mg | ORAL_TABLET | ORAL | Status: DC | PRN
  Administered 2016-04-30 – 2016-05-03 (×3): 10 mg via ORAL
  Filled 2016-04-30 (×3): qty 2

## 2016-04-30 MED ORDER — PROPOFOL 10 MG/ML IV BOLUS
0.5000 mg/kg | Freq: Once | INTRAVENOUS | Status: AC
  Administered 2016-04-30: 53 mg via INTRAVENOUS
  Filled 2016-04-30: qty 20

## 2016-04-30 MED ORDER — TRAZODONE HCL 50 MG PO TABS
50.0000 mg | ORAL_TABLET | Freq: Every day | ORAL | Status: DC
  Administered 2016-04-30 – 2016-05-03 (×4): 50 mg via ORAL
  Filled 2016-04-30 (×4): qty 1

## 2016-04-30 MED ORDER — SUCCINYLCHOLINE CHLORIDE 200 MG/10ML IV SOSY
PREFILLED_SYRINGE | INTRAVENOUS | Status: AC
  Filled 2016-04-30: qty 10

## 2016-04-30 MED ORDER — PROPOFOL 10 MG/ML IV BOLUS
INTRAVENOUS | Status: AC | PRN
  Administered 2016-04-30 (×5): 25 mg via INTRAVENOUS

## 2016-04-30 MED ORDER — LIDOCAINE 2% (20 MG/ML) 5 ML SYRINGE
INTRAMUSCULAR | Status: AC
  Filled 2016-04-30: qty 5

## 2016-04-30 MED ORDER — METOPROLOL SUCCINATE ER 100 MG PO TB24
200.0000 mg | ORAL_TABLET | Freq: Every day | ORAL | Status: DC
  Administered 2016-04-30 – 2016-05-04 (×5): 200 mg via ORAL
  Filled 2016-04-30 (×5): qty 2

## 2016-04-30 MED ORDER — PANTOPRAZOLE SODIUM 40 MG PO TBEC
40.0000 mg | DELAYED_RELEASE_TABLET | Freq: Every day | ORAL | Status: DC
  Administered 2016-04-30 – 2016-05-04 (×5): 40 mg via ORAL
  Filled 2016-04-30 (×5): qty 1

## 2016-04-30 MED ORDER — HYDROCODONE-ACETAMINOPHEN 5-325 MG PO TABS
1.0000 | ORAL_TABLET | ORAL | Status: DC | PRN
  Administered 2016-04-30 – 2016-05-02 (×3): 2 via ORAL
  Administered 2016-05-02: 1 via ORAL
  Filled 2016-04-30: qty 2
  Filled 2016-04-30: qty 1
  Filled 2016-04-30 (×2): qty 2

## 2016-04-30 MED ORDER — FENTANYL CITRATE (PF) 100 MCG/2ML IJ SOLN: 25.0000 ug | INTRAMUSCULAR | Status: DC | PRN

## 2016-04-30 MED ORDER — FERROUS SULFATE 325 (65 FE) MG PO TABS
325.0000 mg | ORAL_TABLET | Freq: Every day | ORAL | Status: DC
Start: 2016-04-30 — End: 2016-05-04
  Administered 2016-04-30 – 2016-05-04 (×5): 325 mg via ORAL
  Filled 2016-04-30 (×5): qty 1

## 2016-04-30 MED ORDER — PROPOFOL 10 MG/ML IV BOLUS
INTRAVENOUS | Status: DC | PRN
  Administered 2016-04-30: 100 mg via INTRAVENOUS

## 2016-04-30 MED ORDER — ATORVASTATIN CALCIUM 20 MG PO TABS
20.0000 mg | ORAL_TABLET | Freq: Every day | ORAL | Status: DC
  Administered 2016-04-30 – 2016-05-04 (×5): 20 mg via ORAL
  Filled 2016-04-30 (×5): qty 1

## 2016-04-30 MED ORDER — ESCITALOPRAM OXALATE 10 MG PO TABS
10.0000 mg | ORAL_TABLET | Freq: Every day | ORAL | Status: DC
  Administered 2016-04-30 – 2016-05-04 (×5): 10 mg via ORAL
  Filled 2016-04-30 (×5): qty 1

## 2016-04-30 MED ORDER — PROPOFOL 10 MG/ML IV BOLUS
INTRAVENOUS | Status: AC
  Filled 2016-04-30: qty 20

## 2016-04-30 MED ORDER — LORATADINE 10 MG PO TABS
10.0000 mg | ORAL_TABLET | Freq: Every day | ORAL | Status: DC
  Administered 2016-04-30 – 2016-05-04 (×5): 10 mg via ORAL
  Filled 2016-04-30 (×5): qty 1

## 2016-04-30 MED ORDER — MAGNESIUM OXIDE 400 (241.3 MG) MG PO TABS: 400.0000 mg | ORAL_TABLET | Freq: Two times a day (BID) | ORAL | Status: DC

## 2016-04-30 MED ORDER — ADULT MULTIVITAMIN W/MINERALS CH
1.0000 | ORAL_TABLET | Freq: Every day | ORAL | Status: DC
Start: 2016-04-30 — End: 2016-05-04
  Administered 2016-04-30 – 2016-05-04 (×5): 1 via ORAL
  Filled 2016-04-30 (×5): qty 1

## 2016-04-30 MED ORDER — FENTANYL CITRATE (PF) 100 MCG/2ML IJ SOLN
75.0000 ug | Freq: Once | INTRAMUSCULAR | Status: AC
  Administered 2016-04-30: 75 ug via INTRAVENOUS
  Filled 2016-04-30: qty 2

## 2016-04-30 MED ORDER — LACTATED RINGERS IV SOLN
INTRAVENOUS | Status: DC | PRN
  Administered 2016-04-30: 02:00:00 via INTRAVENOUS

## 2016-04-30 MED ORDER — FOLIC ACID 1 MG PO TABS
1.0000 mg | ORAL_TABLET | Freq: Every day | ORAL | Status: DC
  Administered 2016-04-30 – 2016-05-04 (×5): 1 mg via ORAL
  Filled 2016-04-30 (×5): qty 1

## 2016-04-30 MED ORDER — LIDOCAINE HCL (CARDIAC) 20 MG/ML IV SOLN
INTRAVENOUS | Status: DC | PRN
  Administered 2016-04-30: 80 mg via INTRATRACHEAL

## 2016-04-30 MED ORDER — APIXABAN 5 MG PO TABS
5.0000 mg | ORAL_TABLET | Freq: Two times a day (BID) | ORAL | Status: DC
  Administered 2016-04-30 – 2016-05-04 (×9): 5 mg via ORAL
  Filled 2016-04-30 (×9): qty 1

## 2016-04-30 MED ORDER — B COMPLEX-C PO TABS
1.0000 | ORAL_TABLET | Freq: Every day | ORAL | Status: DC
  Administered 2016-04-30 – 2016-05-04 (×5): 1 via ORAL
  Filled 2016-04-30 (×5): qty 1

## 2016-04-30 MED ORDER — DOCUSATE SODIUM 100 MG PO CAPS
100.0000 mg | ORAL_CAPSULE | Freq: Two times a day (BID) | ORAL | Status: DC
  Administered 2016-04-30 – 2016-05-02 (×6): 100 mg via ORAL
  Filled 2016-04-30 (×8): qty 1

## 2016-04-30 NOTE — Discharge Instructions (Addendum)
Keep your knee immobilizer on at all times except when showering.  DO NOT bend forward at the waist, cross your legs or squat down.

## 2016-04-30 NOTE — Sedation Documentation (Signed)
Dr. Wyvonnia Dusky applied traction and will attempt to reduct the hip

## 2016-04-30 NOTE — Sedation Documentation (Signed)
2nd attempt of reduction unsuccessful

## 2016-04-30 NOTE — Progress Notes (Signed)
Orthopedic Tech Progress Note Patient Details:  Darryl Diaz [birth year] [id number]  Ortho Devices Type of Ortho Device: Knee Immobilizer Ortho Device/Splint Location: lle Ortho Device/Splint Interventions: Ordered, Application Left ki with dr  Karolee Stamps 04/30/2016, 1:12 AM

## 2016-04-30 NOTE — Sedation Documentation (Addendum)
Another attempt to reduct the hip completed by Dr. Wyvonnia Dusky. Pulses palpable. Attempt unsuccessful

## 2016-04-30 NOTE — Progress Notes (Signed)
   Subjective: Day of Surgery Procedure(s) (LRB): CLOSED REDUCTION HIP (Left) Patient reports pain as mild.   Patient seen in rounds for Dr. Doran Durand. Patient is well, and has had no acute complaints or problems. Reports that he is comfortable. Unsure about how to get home. Not sure where his house keys are. No SOB or chest pain.    Objective: Vital signs in last 24 hours: Temp:  [97 F (36.1 C)-98.8 F (37.1 C)] 98.2 F (36.8 C) (12/23 0644) Pulse Rate:  [63-76] 72 (12/23 0644) Resp:  [12-24] 18 (12/23 0644) BP: (115-144)/(57-119) 128/75 (12/23 0644) SpO2:  [83 %-100 %] 98 % (12/23 0644) Weight:  [107.9 kg (237 lb 14 oz)-108 kg (238 lb)] 107.9 kg (237 lb 14 oz) (12/23 0333)  Intake/Output from previous day:  Intake/Output Summary (Last 24 hours) at 04/30/16 9753 Last data filed at 04/30/16 0600  Gross per 24 hour  Intake              700 ml  Output             1105 ml  Net             -405 ml     EXAM General - Patient is Alert and Oriented Extremity - Neurologically intact Intact pulses distally Dorsiflexion/Plantar flexion intact No cellulitis present Compartment soft   Past Medical History:  Diagnosis Date  . 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 Atrial fibrillation ([place])   . Fall at home 03/28/2016  . GERD (gastroesophageal reflux disease)   . H/O seasonal allergies   . Hyperlipidemia   . Hypertension   . Insomnia   . 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 OA (osteoarthritis)    hands    Assessment/Plan: Day of Surgery Procedure(s) (LRB): CLOSED REDUCTION HIP (Left) Active Problems:   Dislocation of hip, posterior, left, closed (HCC)  Estimated body mass index is 32.26 kg/m as calculated from the following:   Height as of this encounter: 6' (1.829 m).   Weight as of this encounter: 107.9 kg (237 lb 14 oz). Advance diet Up with therapy  Plan for DC home today. Maintain knee immobilizer. WBAT with walker.  Social work on board to help with transportation needs.   Ardeen Jourdain, PA-C Orthopaedic Surgery 04/30/2016, 8:23 AM

## 2016-04-30 NOTE — ED Notes (Signed)
Pt's sat at 82% on 2L [place], increased to 5L and spo2 at 100%

## 2016-04-30 NOTE — Sedation Documentation (Signed)
Unable to read etCO2, pt bagged by respiratory as resident attempts to reduct the hip

## 2016-04-30 NOTE — Sedation Documentation (Addendum)
Resident and Dr. Oleta Mouse continue to reduct the hip and pt continues to be bagged by respiratory

## 2016-04-30 NOTE — Sedation Documentation (Signed)
Portable xray at bedside.

## 2016-04-30 NOTE — Evaluation (Signed)
Physical Therapy Evaluation Patient Details Name: Darryl Diaz MRN: [id number] DOB: 08/11/1940 Today's Date: 04/30/2016   History of Present Illness  75 yo male admitted on 04/29/16 through ED following left hip dislocation and underwent a closed reduction on 04/30/16. PMH significant for left hip replacement following fall 04/01/16, ETOH abuse, depression, HTN, GERD, atrial fib.  Clinical Impression  Pt presents POD 0 following closed reduction of left hip following dislocation on 03/30/16. Pt lives alone in two story home with main living environment on the first level. Pt's sister and brother in law are currently out of town until after the holidays and are no available to assist at this time. Pt does have two neighbors/friends who can assist PRN. Attempted getting pt OOB and his pain increased to 10/10 requiring him to bring LLE back into bed x2. Pt is unable to sit EOB nor move LLE to EOB without excruciating pain. With current functional status, pt is not safe to return home at this time and will benefit from SNF placement. If pain is controlled, pt may be able to maneuver OOB and Home health PT may be more appropriate. Will continue to follow pt and his progress in order to make a more informed decision as to appropriate discharge.     Follow Up Recommendations Home health PT;SNF;Supervision/Assistance - 24 hour;Supervision for mobility/OOB    Equipment Recommendations  None recommended by PT    Recommendations for Other Services       Precautions / Restrictions Precautions Precautions: Fall;Posterior Hip Precaution Booklet Issued: Yes (comment) Precaution Comments: posterior hip precautions from previous surgery Required Braces or Orthoses: Knee Immobilizer - Left Knee Immobilizer - Left: On at all times Restrictions Weight Bearing Restrictions: Yes LLE Weight Bearing: Weight bearing as tolerated      Mobility  Bed Mobility Overal bed mobility: Needs Assistance Bed  Mobility: Supine to Sit     Supine to sit: Min assist     General bed mobility comments: Min A for bringing LLE to EOB. Pt has pain increase to 10/10 with movement to EOB and is unable to sit up in bed. Able to position self with support of LLE for moving up in bed  Transfers                    Ambulation/Gait                Stairs            Wheelchair Mobility    Modified Rankin (Stroke Patients Only)       Balance                                             Pertinent Vitals/Pain Pain Assessment: 0-10 Pain Score: 3  Pain Location: left hip Pain Descriptors / Indicators: Dull;Guarding;Grimacing;Sore Pain Intervention(s): Monitored during session;Limited activity within patient's tolerance;Premedicated before session;Ice applied    Home Living Family/patient expects to be discharged to:: Private residence Living Arrangements: Alone Available Help at Discharge: Family;Available PRN/intermittently;Friend(s) Type of Home: House Home Access: Level entry     Home Layout: Two level;Able to live on main level with bedroom/bathroom;Full bath on main level Home Equipment: Walker - 2 wheels;Wheelchair - [employer] - 4 wheels;Cane - single point;Shower seat;Shower seat - built in;Toilet riser      Prior Function Level of Independence: Independent  Comments: was occasional using cane, but getting around without AD and driving and doing own errands     Hand Dominance   Dominant Hand: Right    Extremity/Trunk Assessment   Upper Extremity Assessment Upper Extremity Assessment: Defer to OT evaluation    Lower Extremity Assessment Lower Extremity Assessment: LLE deficits/detail LLE Deficits / Details: pt with pain and weakness, at least 3/5 ankle and 2/5 knee and hip per gross functional assessment       Communication   Communication: No difficulties  Cognition Arousal/Alertness: Awake/alert Behavior During  Therapy: WFL for tasks assessed/performed Overall Cognitive Status: Within Functional Limits for tasks assessed                      General Comments      Exercises Total Joint Exercises Ankle Circles/Pumps: AROM;Both;20 reps;Supine Quad Sets: AROM;Left;10 reps;Supine Gluteal Sets: AROM;Both;10 reps;Supine   Assessment/Plan    PT Assessment Patient needs continued PT services  PT Problem List Decreased strength;Decreased range of motion;Decreased activity tolerance;Decreased balance;Decreased mobility;Pain          PT Treatment Interventions DME instruction;Gait training;Functional mobility training;Therapeutic activities;Therapeutic exercise;Balance training;Patient/family education    PT Goals (Current goals can be found in the Care Plan section)  Acute Rehab PT Goals Patient Stated Goal: to have less pain and get home PT Goal Formulation: With patient Time For Goal Achievement: 05/07/16 Potential to Achieve Goals: Good    Frequency Min 5X/week   Barriers to discharge Decreased caregiver support pt will require 24 hr cg assistance upon discharge home    Co-evaluation               End of Session Equipment Utilized During Treatment: Gait belt;Left knee immobilizer Activity Tolerance: Patient limited by pain Patient left: in bed;with call bell/phone within reach;with SCD's reapplied Nurse Communication: Mobility status;Patient requests pain meds         Time: 1531-1605 PT Time Calculation (min) (ACUTE ONLY): 34 min   Charges:   PT Evaluation $PT Eval Moderate Complexity: 1 Procedure PT Treatments $Therapeutic Activity: 8-22 mins   PT G Codes:        Scheryl Marten PT, DPT  [phone]  04/30/2016, 4:13 PM

## 2016-04-30 NOTE — Anesthesia Preprocedure Evaluation (Addendum)
Anesthesia Evaluation  Patient identified by MRN, date of birth, ID band Patient awake    Reviewed: Allergy & Precautions, H&P , NPO status , Patient's Chart, lab work & pertinent test results, reviewed documented beta blocker date and time   Airway Mallampati: IV  TM Distance: >3 FB Neck ROM: Full    Dental no notable dental hx. (+) Teeth Intact, Dental Advisory Given   Pulmonary neg pulmonary ROS, former smoker,    Pulmonary exam normal breath sounds clear to auscultation       Cardiovascular hypertension, Pt. on medications and Pt. on home beta blockers + dysrhythmias Atrial Fibrillation  Rhythm:Regular Rate:Normal     Neuro/Psych Depression negative neurological ROS     GI/Hepatic Neg liver ROS, GERD  Medicated and Controlled,  Endo/Other  negative endocrine ROS  Renal/GU negative Renal ROS  negative genitourinary   Musculoskeletal  (+) Arthritis , Osteoarthritis,    Abdominal   Peds  Hematology negative hematology ROS (+)   Anesthesia Other Findings   Reproductive/Obstetrics negative OB ROS                            Anesthesia Physical Anesthesia Plan  ASA: III and emergent  Anesthesia Plan: General   Post-op Pain Management:    Induction: Intravenous  Airway Management Planned: Mask  Additional Equipment:   Intra-op Plan:   Post-operative Plan:   Informed Consent: I have reviewed the patients History and Physical, chart, labs and discussed the procedure including the risks, benefits and alternatives for the proposed anesthesia with the patient or authorized representative who has indicated his/her understanding and acceptance.   Dental advisory given  Plan Discussed with: CRNA  Anesthesia Plan Comments:         Anesthesia Quick Evaluation

## 2016-04-30 NOTE — H&P (Signed)
Darryl Diaz is an 75 y.o. male.   Chief Complaint: left hip pain HPI: 75 y/o male with h/o left hip hemiarthroplasty a month ago c/o pain in the left hip.  Mechanism of injury is unclear, and he may have fallen.  He c/o pain with motion and feels better when lying still.  Past Medical History:  Diagnosis Date  . 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 Atrial fibrillation ([place])   . Fall at home 03/28/2016  . GERD (gastroesophageal reflux disease)   . H/O seasonal allergies   . Hyperlipidemia   . Hypertension   . Insomnia   . 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 OA (osteoarthritis)    hands    Past Surgical History:  Procedure Laterality Date  . ANKLE SURGERY Left 2014   had rods put in   . HIP ARTHROPLASTY Left 04/01/2016   Procedure: ARTHROPLASTY BIPOLAR HIP (HEMIARTHROPLASTY);  Surgeon: Paralee Cancel, MD;  Location: [place] [facility];  Service: Orthopedics;  Laterality: Left;  . TONSILLECTOMY      Family History  Problem Relation Age of Onset  . Heart disease Mother 30  . Hypertension Mother   . Arthritis Mother   . Heart failure Father 62  . Stroke Maternal Aunt   . Heart failure Sister    Social History:  reports that he has quit smoking. He has never used smokeless tobacco. He reports that he drinks alcohol. He reports that he does not use drugs.  Allergies:  Allergies  Allergen Reactions  . Penicillins     Childhood allergy     (Not in a hospital admission)  No results found for this or any previous visit (from the past 48 hour(s)). Dg Hip Port Unilat W Or Wo Pelvis 1 View Left  Result Date: 04/30/2016 CLINICAL DATA:  Left hip dislocation EXAM: DG HIP (WITH OR WITHOUT PELVIS) 1V PORT LEFT COMPARISON:  Left hip radiograph 04/29/2016 FINDINGS: The dislocated left hip hemiarthroplasty is in unchanged position, with persistent superior and lateral displacement. IMPRESSION: Unchanged appearance of dislocated left hip hemiarthroplasty.  Electronically Signed   By: Ulyses Jarred M.D.   On: 04/30/2016 01:25   Dg Hip Port Unilat W Or Wo Pelvis 1 View Left  Result Date: 04/29/2016 CLINICAL DATA:  [place] lead tip pain 1 day ago. EXAM: DG HIP (WITH OR WITHOUT PELVIS) 1V PORT LEFT COMPARISON:  None. FINDINGS: Portable images demonstrate dislocation of the left hip bipolar hemiarthroplasty. No fractures are evident on these limited views. IMPRESSION: Left hip dislocation. Electronically Signed   By: Andreas Newport M.D.   On: 04/29/2016 23:46    ROS  No recent f/c/n/v/wt loss  Blood pressure 127/63, pulse 65, temperature 98.8 F (37.1 C), temperature source Oral, resp. rate 19, SpO2 97 %. Physical Exam  wn wd woman in nad.  A and o x 4.  Mood and affect normal.  EOMI.  resp unlabored.  L LE shortened and adducted.  Skin incision is healed.  No signs of infection.  Pulses palpable.  No lymphadenopathy.  5/5 strength in PF and DF of the toes and ankle.  Assessment/Plan L hip dislocation.  To OR for closed reduction under anesthesia.  The risks and benefits of the alternative treatment options have been discussed in detail.  The patient wishes to proceed with surgery and specifically understands risks of bleeding, infection, nerve damage, blood clots, need for additional surgery, amputation and death.   Wylene Simmer,  MD 04/30/2016, 1:58 AM

## 2016-04-30 NOTE — Sedation Documentation (Signed)
Pt continues to be bagged by respiratory

## 2016-04-30 NOTE — Brief Op Note (Signed)
04/29/2016 - 04/30/2016  2:31 AM  PATIENT:  Darryl Diaz  75 y.o. male  PRE-OPERATIVE DIAGNOSIS:  Left prosthetic hip dislocation  POST-OPERATIVE DIAGNOSIS:  same  Procedure(s):  Closed reduction of left hip dislocation under anesthesia  SURGEON:  Wylene Simmer, MD  ASSISTANT: n/a  ANESTHESIA:   General  EBL:  none  TOURNIQUET:  none  COMPLICATIONS:  None apparent  DISPOSITION:  Extubated, awake and stable to recovery.  DICTATION ID:  [id number]

## 2016-04-30 NOTE — Anesthesia Procedure Notes (Addendum)
Date/Time: 04/30/2016 2:20 AM Performed by: Claris Che Pre-anesthesia Checklist: Patient identified, Emergency Drugs available, Suction available, Patient being monitored and Timeout performed Patient Re-evaluated:Patient Re-evaluated prior to inductionOxygen Delivery Method: Circle system utilized Preoxygenation: Pre-oxygenation with 100% oxygen Intubation Type: IV induction Ventilation: Mask ventilation without difficulty and Mask ventilation throughout procedure Dental Injury: Teeth and Oropharynx as per pre-operative assessment

## 2016-04-30 NOTE — Transfer of Care (Signed)
Immediate Anesthesia Transfer of Care Note  Patient: Darryl Diaz  Procedure(s) Performed: Procedure(s): CLOSED REDUCTION HIP (Left)  Patient Location: PACU  Anesthesia Type:General  Level of Consciousness: awake, alert , oriented and patient cooperative  Airway & Oxygen Therapy: Patient Spontanous Breathing and Patient connected to nasal cannula oxygen  Post-op Assessment: Report given to RN, Post -op Vital signs reviewed and stable and Patient moving all extremities X 4  Post vital signs: Reviewed and stable  Last Vitals:  Vitals:   04/30/16 0145 04/30/16 0235  BP: 127/63   Pulse: 65   Resp: 19   Temp:  36.1 C    Last Pain:  Vitals:   04/30/16 0155  TempSrc:   PainSc: 3          Complications: No apparent anesthesia complications

## 2016-04-30 NOTE — Sedation Documentation (Signed)
Unable to get accurate BP due to movement

## 2016-04-30 NOTE — Care Management Note (Signed)
75 yo M with L prosthetic hip dislocation. He has a h/o L hip hemiarthroplasty a month ago. c/o pain in the left hip.  Mechanism of injury is unclear, and he may have fallen. S/p closed reduction of L hip dislocation under anesthesia.  Received call from RN stating that pt needs an ambulance to go home today.  Met with pt at bedside. He is c/o a lot of pain. He lives alone. He stated that his neighbor checks on him.  Discussed the discharge plan with pt's nurse and charge nurse. Pt hasn't been up. Informed nurse that we need to make sure that pt is able to get up and walk. Discussed a PT eval to make sure that it is safe for pt to return home alone.  Will continue to f/u to assist with the D/C plan and the ambulance.

## 2016-04-30 NOTE — Sedation Documentation (Signed)
Dr. Oleta Mouse attempts to reduct the hip

## 2016-04-30 NOTE — Sedation Documentation (Signed)
Pt desat to 87% after propofol administration. Pt bagged by respiratory at this time

## 2016-05-01 MED ORDER — METHOCARBAMOL 500 MG PO TABS
500.0000 mg | ORAL_TABLET | Freq: Four times a day (QID) | ORAL | Status: DC | PRN
  Administered 2016-05-01 – 2016-05-03 (×3): 500 mg via ORAL
  Filled 2016-05-01 (×3): qty 1

## 2016-05-01 MED ORDER — METHOCARBAMOL 1000 MG/10ML IJ SOLN
500.0000 mg | Freq: Four times a day (QID) | INTRAVENOUS | Status: DC | PRN
  Filled 2016-05-01: qty 5

## 2016-05-01 NOTE — Progress Notes (Addendum)
Physical Therapy Treatment Patient Details Name: Darryl Diaz MRN: [id number] DOB: 05-21-1940 Today's Date: 05/01/2016    History of Present Illness 75 yo male admitted on 04/29/16 through ED following left hip dislocation and underwent a closed reduction on 04/30/16. PMH significant for left hip replacement following fall 04/01/16, ETOH abuse, depression, HTN, GERD, atrial fib.    PT Comments    Pt presents POD 1 following closed reduction of left hip. Attempted EOB transfers with left this session with pt able to tolerate getting to EOB, but unable to sit upright at edge of pain due to increased c/o pain in left gluteal region. Pt worked with OT this session and attempted x 2-3 to get pt EOB, but he yells out in pain and is unable to achieve upright positioning. Pt is, however, able to tolerate his head upright in bed with LE's extended. Will reattempt OOB transfers next session and possibly reattempt this date if times allows. Pt will benefit from [place] in order to reduce skin breakdown due to his inability to get OOB at this time.   Follow Up Recommendations  SNF;Supervision/Assistance - 24 hour     Equipment Recommendations  None recommended by PT    Recommendations for Other Services       Precautions / Restrictions Precautions Precautions: Fall;Posterior Hip Precaution Booklet Issued: Yes (comment) Precaution Comments: posterior hip precautions from previous surgery Required Braces or Orthoses: Knee Immobilizer - Left Knee Immobilizer - Left: On at all times Restrictions Weight Bearing Restrictions: Yes LLE Weight Bearing: Weight bearing as tolerated    Mobility  Bed Mobility Overal bed mobility: Needs Assistance Bed Mobility: Supine to Sit;Sit to Supine     Supine to sit: +2 for physical assistance;Max assist Sit to supine: +2 for physical assistance;Max assist   General bed mobility comments: pt resisting posteriorly. pt reports immediate pain with  all mobility. Attempting helicopter method with pad and pt tolerated legs held in extenion but pt resisting and laying supine with bil LE off EOB with L LE fully supported in extension. pt demonstrates HOB incr static sitting >50 degrees with L LE in extensino however unable to return demonstrate at the EOB. pt resisting and yelling "lay me back" Pt reports pain immediately resolves with full body extension  Transfers                 General transfer comment: unable to complete  Ambulation/Gait                 Stairs            Wheelchair Mobility    Modified Rankin (Stroke Patients Only)       Balance                                    Cognition Arousal/Alertness: Awake/alert Behavior During Therapy: WFL for tasks assessed/performed Overall Cognitive Status: Impaired/Different from baseline Area of Impairment: Awareness           Awareness: Anticipatory   General Comments: Pt demonstrates lack of awareness to incontinence in the bed. pt laying on a utensil and urine saturated linen without awareness.  Pt with strong urine odor and even with smell lack of awareness to incontinence in bed    Exercises      General Comments        Pertinent Vitals/Pain Pain Assessment: 0-10 Pain Score: 10-Worst pain ever Pain  Location: left hip Pain Descriptors / Indicators: Shooting;Burning;Stabbing Pain Intervention(s): Monitored during session;Limited activity within patient's tolerance;Repositioned;Patient requesting pain meds-RN notified;RN gave pain meds during session;Ice applied    Home Living Family/patient expects to be discharged to:: Skilled nursing facility                    Prior Function Level of Independence: Independent      Comments: was occasional using cane, but getting around without AD and driving and doing own errands   PT Goals (current goals can now be found in the care plan section) Acute Rehab PT  Goals Patient Stated Goal: to have less pain and get home Progress towards PT goals: Not progressing toward goals - comment (limited by pain and unable to perfrom OOB or EOB transfers)    Frequency    Min 5X/week      PT Plan Current plan remains appropriate    Co-evaluation PT/OT/SLP Co-Evaluation/Treatment: Yes Reason for Co-Treatment: Complexity of the patient's impairments (multi-system involvement) PT goals addressed during session: Mobility/safety with mobility OT goals addressed during session: ADL's and self-care;Strengthening/ROM     End of Session Equipment Utilized During Treatment: Gait belt;Left knee immobilizer Activity Tolerance: Patient limited by pain Patient left: in bed;with call bell/phone within reach;with SCD's reapplied     Time: 0737-1062 PT Time Calculation (min) (ACUTE ONLY): 21 min  Charges:  $Therapeutic Exercise: 8-22 mins                    G Codes:      Scheryl Marten PT, DPT  [phone]  05/01/2016, 9:00 AM

## 2016-05-01 NOTE — Anesthesia Postprocedure Evaluation (Signed)
Anesthesia Post Note  Patient: Darryl Diaz  Procedure(s) Performed: Procedure(s) (LRB): CLOSED REDUCTION HIP (Left)  Patient location during evaluation: PACU Anesthesia Type: General Level of consciousness: awake and alert Pain management: pain level controlled Vital Signs Assessment: post-procedure vital signs reviewed and stable Respiratory status: spontaneous breathing, nonlabored ventilation and respiratory function stable Cardiovascular status: blood pressure returned to baseline and stable Postop Assessment: no signs of nausea or vomiting Anesthetic complications: no       Last Vitals:  Vitals:   04/30/16 2000 05/01/16 0435  BP: 128/66 (!) 153/71  Pulse: 66 72  Resp: 20 19  Temp: 36.9 C 37.1 C    Last Pain:  Vitals:   05/01/16 0435  TempSrc: Oral  PainSc:                  Angeli Demilio,W. EDMOND

## 2016-05-01 NOTE — Progress Notes (Signed)
   Subjective: 1 Day Post-Op Procedure(s) (LRB): CLOSED REDUCTION HIP (Left) Patient reports pain as severe when attempts to sit upright, mild at rest.   Patient seen in rounds for Dr. Doran Durand. Patient is well, and has had no acute complaints or problems. Denies SOB or distal numbness  Objective: Vital signs in last 24 hours: Temp:  [98.4 F (36.9 C)-99.3 F (37.4 C)] 98.8 F (37.1 C) (12/24 0435) Pulse Rate:  [66-78] 72 (12/24 0435) Resp:  [18-20] 19 (12/24 0435) BP: (128-153)/(66-76) 153/71 (12/24 0435) SpO2:  [96 %-99 %] 99 % (12/24 0435)  Intake/Output from previous day:  Intake/Output Summary (Last 24 hours) at 05/01/16 0811 Last data filed at 05/01/16 0439  Gross per 24 hour  Intake             1200 ml  Output              875 ml  Net              325 ml     EXAM General - Patient is Alert and Oriented Extremity - Neurologically intact Intact pulses distally Dorsiflexion/Plantar flexion intact No cellulitis present Compartment soft   Past Medical History:  Diagnosis Date  . 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 Atrial fibrillation ([place])   . Fall at home 03/28/2016  . GERD (gastroesophageal reflux disease)   . H/O seasonal allergies   . Hyperlipidemia   . Hypertension   . Insomnia   . 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 OA (osteoarthritis)    hands    Assessment/Plan: 1 Day Post-Op Procedure(s) (LRB): CLOSED REDUCTION HIP (Left) Active Problems:   Dislocation of hip, posterior, left, closed (HCC)  Estimated body mass index is 32.26 kg/m as calculated from the following:   Height as of this encounter: 6' (1.829 m).   Weight as of this encounter: 107.9 kg (237 lb 14 oz). Advance diet Up with therapy  -adding Robaxin for mm spasm, will monitor pain.  Currently unable to mobilize 2/2 pain.  Will follow with therapy for progress Plan for DC home when able. Maintain knee immobilizer. WBAT with walker. Social  work on board to help with transportation needs.   Nicholes Stairs  05/01/2016, 8:11 AM

## 2016-05-01 NOTE — Evaluation (Signed)
Occupational Therapy Evaluation Patient Details Name: Darryl Diaz MRN: [id number] DOB: 1941-04-02 Today's Date: 05/01/2016    History of Present Illness 75 yo male admitted on 04/29/16 through ED following left hip dislocation and underwent a closed reduction on 04/30/16. PMH significant for left hip replacement following fall 04/01/16, ETOH abuse, depression, HTN, GERD, atrial fib.   Clinical Impression   PT admitted with Closed reduction of L hip 04/30/16. Pt currently with functional limitiations due to the deficits listed below (see OT problem list). PTA was living alone with family checking on him per patient 3 x per week and a maid (A) for cleaning.  Pt will benefit from skilled OT to increase their independence and safety with adls and balance to allow discharge SNF. Pt could benefit from AIR MATTRESS OVERLAY to prevent skin break down. Pt will need frequent linen checks from RN / [facility] due to incontinence and lack of awareness to saturated linen. Pt can not progress to EOB sitting at this time thus CAN NOT return home safely. PT requires 24/7 Care at bed level at this time.      Follow Up Recommendations  SNF    Equipment Recommendations  3 in 1 bedside commode;Hospital bed;Wheelchair cushion (measurements OT);Wheelchair (measurements OT);Other (comment) (air mattress overlay)    Recommendations for Other Services       Precautions / Restrictions Precautions Precautions: Fall;Posterior Hip Precaution Booklet Issued: Yes (comment) Precaution Comments: posterior hip precautions from previous surgery Required Braces or Orthoses: Knee Immobilizer - Left Knee Immobilizer - Left: On at all times Restrictions Weight Bearing Restrictions: Yes LLE Weight Bearing: Weight bearing as tolerated      Mobility Bed Mobility Overal bed mobility: Needs Assistance Bed Mobility: Supine to Sit;Sit to Supine     Supine to sit: +2 for physical assistance;Max assist Sit to supine:  +2 for physical assistance;Max assist   General bed mobility comments: pt resisting posteriorly. pt reports immediate pain with all mobility. Attempting helicopter method with pad and pt tolerated legs held in extenion but pt resisting and laying supine with bil LE off EOB with L LE fully supported in extension. pt demonstrates HOB incr static sitting >50 degrees with L LE in extensino however unable to return demonstrate at the EOB. pt resisting and yelling "lay me back" Pt reports pain immediately resolves with full body extension  Transfers                 General transfer comment: unable to complete    Balance                                            ADL Overall ADL's : Needs assistance/impaired Eating/Feeding: Modified independent;Bed level Eating/Feeding Details (indicate cue type and reason): requires (A) to position in the bed level Grooming: Oral care;Wash/dry face;Bed level   Upper Body Bathing: Minimal assistance   Lower Body Bathing: Total assistance   Upper Body Dressing : Min guard   Lower Body Dressing: Total assistance                 General ADL Comments: Pt unable to tolerate bed mobility. Pt with urine on bed and lack of awareness. pt needs max cues to progress with therapy     Vision     Perception     Praxis      Pertinent Vitals/Pain Pain Assessment:  0-10 Pain Score: 10-Worst pain ever Pain Location: left hip Pain Descriptors / Indicators: Shooting;Burning;Stabbing Pain Intervention(s): Monitored during session;Limited activity within patient's tolerance;Premedicated before session;Repositioned;Patient requesting pain meds-RN notified;Ice applied     Hand Dominance Right   Extremity/Trunk Assessment Upper Extremity Assessment Upper Extremity Assessment: Overall WFL for tasks assessed   Lower Extremity Assessment Lower Extremity Assessment: Defer to PT evaluation   Cervical / Trunk Assessment Cervical / Trunk  Assessment: Normal   Communication Communication Communication: No difficulties   Cognition Arousal/Alertness: Awake/alert Behavior During Therapy: WFL for tasks assessed/performed Overall Cognitive Status: Impaired/Different from baseline Area of Impairment: Awareness           Awareness: Anticipatory   General Comments: Pt demonstrates lack of awareness to incontinence in the bed. pt laying on a utensil and urine saturated linen without awareness.  Pt with strong urine odor and even with smell lack of awareness to incontinence in bed   General Comments       Exercises       Shoulder Instructions      Home Living Family/patient expects to be discharged to:: Skilled nursing facility                                        Prior Functioning/Environment Level of Independence: Independent        Comments: was occasional using cane, but getting around without AD and driving and doing own errands        OT Problem List: Decreased strength;Decreased activity tolerance;Impaired balance (sitting and/or standing);Decreased safety awareness;Decreased knowledge of use of DME or AE;Decreased knowledge of precautions;Pain;Obesity;Decreased cognition   OT Treatment/Interventions: Self-care/ADL training;Therapeutic exercise;DME and/or AE instruction;Therapeutic activities;Cognitive remediation/compensation;Patient/family education;Balance training    OT Goals(Current goals can be found in the care plan section) Acute Rehab OT Goals Patient Stated Goal: to have less pain and get home OT Goal Formulation: With patient Time For Goal Achievement: 05/15/16 Potential to Achieve Goals: Good  OT Frequency: Min 2X/week   Barriers to D/C: Decreased caregiver support          Co-evaluation PT/OT/SLP Co-Evaluation/Treatment: Yes Reason for Co-Treatment: Complexity of the patient's impairments (multi-system involvement);Necessary to address cognition/behavior during  functional activity;For patient/therapist safety;To address functional/ADL transfers   OT goals addressed during session: ADL's and self-care;Strengthening/ROM      End of Session Nurse Communication: Mobility status;Precautions;Weight bearing status  Activity Tolerance: Patient tolerated treatment well Patient left: in bed;with call bell/phone within reach;with bed alarm set;with SCD's reapplied   Time: 4034-7425 OT Time Calculation (min): 23 min Charges:  OT General Charges $OT Visit: 1 Procedure OT Evaluation $OT Eval High Complexity: 1 Procedure G-Codes:    Peri Maris 2016-05-26, 8:24 AM  Jeri Modena   OTR/L Pager: [phone] Office: [phone] .

## 2016-05-01 NOTE — Progress Notes (Signed)
PT is recommending SNF.  SW will assist with SNF placement.

## 2016-05-02 ENCOUNTER — Encounter (HOSPITAL_COMMUNITY): Payer: Self-pay | Admitting: Orthopedic Surgery

## 2016-05-02 NOTE — Progress Notes (Signed)
Orthopedic Tech Progress Note Patient Details:  Darryl Diaz [birth year] [id number]  Ortho Devices Type of Ortho Device: Knee Immobilizer Ortho Device/Splint Location: Replacement knee immobilizer Ortho Device/Splint Interventions: Application   Darryl Diaz 05/02/2016, 6:25 PM

## 2016-05-02 NOTE — Progress Notes (Addendum)
CSW notes that pt does not currently meet Medicare 3 night inpatient requirement for snf stay. Pt may have previous 30 day stay to qualify.  Percell Locus Taylin Leder LCSWA [phone]

## 2016-05-02 NOTE — Progress Notes (Signed)
   Subjective:  Patient reports pain as moderate.  C/o left hip pain, however feeling a little better than yesterday.   Objective:   VITALS:   Vitals:   05/01/16 0435 05/01/16 1225 05/01/16 2210 05/02/16 0433  BP: (!) 153/71 (!) 129/53 (!) 147/59 125/69  Pulse: 72 81 79 72  Resp: 19 19 18 18   Temp: 98.8 F (37.1 C) 98.8 F (37.1 C) 98.7 F (37.1 C) 98.2 F (36.8 C)  TempSrc: Oral Oral Oral Oral  SpO2: 99% 100% 98% 94%  Weight:      Height:        NAD, sitting up in bed Sensation intact distally Intact pulses distally Dorsiflexion/Plantar flexion intact Incision: no drainage Compartment soft Painless logroll of hip   Lab Results  Component Value Date   WBC 10.0 04/04/2016   HGB 10.4 (L) 04/04/2016   HCT 30.8 (L) 04/04/2016   MCV 94.8 04/04/2016   PLT 176 04/04/2016   BMET    Component Value Date/Time   NA 139 04/04/2016 0626   K 3.4 (L) 04/04/2016 0626   CL 106 04/04/2016 0626   CO2 24 04/04/2016 0626   GLUCOSE 106 (H) 04/04/2016 0626   BUN 10 04/04/2016 0626   CREATININE 1.09 04/04/2016 0626   CALCIUM 8.7 (L) 04/04/2016 0626   GFRNONAA >60 04/04/2016 0626   GFRAA >60 04/04/2016 0626     Assessment/Plan: 2 Days Post-Op   Active Problems:   Dislocation of hip, posterior, left, closed ([place])   WBAT with walker Posterior hip precautions Knee immobilizer PT/OT Dispo: will likely need to return to [facility], Horald Pollen 05/02/2016, 8:27 AM   Rod Can, MD Cell [phone]

## 2016-05-03 MED ORDER — OXYCODONE HCL 5 MG PO TABS: 5.0000 mg | ORAL_TABLET | ORAL | 0 refills | Status: DC | PRN

## 2016-05-03 MED ORDER — METHOCARBAMOL 500 MG PO TABS: 500.0000 mg | ORAL_TABLET | Freq: Three times a day (TID) | ORAL | 0 refills | Status: DC | PRN

## 2016-05-03 NOTE — Discharge Summary (Addendum)
Physician Discharge Summary  Patient ID: Darryl Diaz MRN: [id number] DOB/AGE: [birth year] 74 y.o.  Admit date: 04/29/2016 Discharge date: 05/04/2016  Admission Diagnoses:  Htn, afib, left femoral neck fracture s/p hemiarthroplast about 5 weeks ago.  Discharge Diagnoses: same Active Problems:   Dislocation of hip, posterior, left, closed ([place]) s/p closed reduction of left hip under anesthesia  Discharged Condition: stable  Hospital Course: The patient was admitted on 12/23 and taken to the OR where he underwent closed reduction of his left hip dislocation under anesthesia.  He tolerated this procedure well.  He was admitted to [facility] where his primary problem was pain control.  He required IV pain medication for several days post op.  He had PT and OT consultations, and SNF placement was recommended.  His pain has improved, and he is stable for discharge to SNF when a bed is available.  Consults: PT, OT, SW, case management.  Significant Diagnostic Studies: xrays  Treatments: surgery: as above  Discharge Exam: Blood pressure 128/63, pulse 84, temperature 99 F (37.2 C), temperature source Oral, resp. rate 18, height 6' (1.829 m), weight 107.9 kg (237 lb 14 oz), SpO2 97 %. PE:  wn wd male in nad.  A and O x 4.  Mood and affect normal.  EOMI.  resp unlabored.  L LE symmetric to R LE.  NVI at L foot.  Disposition: 03-Skilled Nursing Facility  Discharge Instructions    Call MD / Call 911    Complete by:  As directed    If you experience chest pain or shortness of breath, CALL 911 and be transported to the hospital emergency room.  If you develope a fever above 101 F, pus (white drainage) or increased drainage or redness at the wound, or calf pain, call your surgeon's office.   Call MD / Call 911    Complete by:  As directed    If you experience chest pain or shortness of breath, CALL 911 and be transported to the hospital emergency room.  If you develope a fever above 101 F, pus (white  drainage) or increased drainage or redness at the wound, or calf pain, call your surgeon's office.   Constipation Prevention    Complete by:  As directed    Drink plenty of fluids.  Prune juice may be helpful.  You may use a stool softener, such as Colace (over the counter) 100 mg twice a day.  Use MiraLax (over the counter) for constipation as needed.   Constipation Prevention    Complete by:  As directed    Drink plenty of fluids.  Prune juice may be helpful.  You may use a stool softener, such as Colace (over the counter) 100 mg twice a day.  Use MiraLax (over the counter) for constipation as needed.   Diet - low sodium heart healthy    Complete by:  As directed    Diet - low sodium heart healthy    Complete by:  As directed    Follow the hip precautions as taught in Physical Therapy    Complete by:  As directed    Increase activity slowly as tolerated    Complete by:  As directed    Increase activity slowly as tolerated    Complete by:  As directed    Patient may shower    Complete by:  As directed    You may shower without a dressing once there is no drainage.  Do not wash over the wound.  If drainage remains, cover wound with plastic wrap and then shower.   Weight bearing as tolerated    Complete by:  As directed    In the knee immobilizer at all times except to shower.   Laterality:  left   Extremity:  Lower   Weight bearing as tolerated    Complete by:  As directed    Knee immobilizer in place at all times except showering.   Laterality:  left   Extremity:  Lower     Allergies as of 05/03/2016      Reactions   Penicillins    Childhood allergy Has patient had a PCN reaction causing immediate rash, facial/tongue/throat swelling, SOB or lightheadedness with hypotension:YES Has patient had a PCN reaction causing severe rash involving mucus membranes or skin necrosis: NO Has patient had a PCN reaction that required hospitalization NO Has patient had a PCN reaction occurring  within the last 10 years: NO If all of the above answers are "NO", then may proceed with Cephalosporin use.      Medication List    STOP taking these medications   HYDROcodone-acetaminophen 5-325 MG tablet Commonly known as:  NORCO/VICODIN     TAKE these medications   atorvastatin 20 MG tablet Commonly known as:  LIPITOR Take 20 mg by mouth daily.   b complex vitamins capsule Take 1 capsule by mouth daily.   cetirizine 10 MG tablet Commonly known as:  ZYRTEC Take 10 mg by mouth daily.   docusate sodium 100 MG capsule Commonly known as:  COLACE Take 1 capsule (100 mg total) by mouth 2 (two) times daily.   ELIQUIS 5 MG Tabs tablet Generic drug:  apixaban Take 5 mg by mouth 2 (two) times daily.   escitalopram 10 MG tablet Commonly known as:  LEXAPRO Take 10 mg by mouth daily.   ferrous sulfate 325 (65 FE) MG EC tablet Take 325 mg by mouth daily with breakfast.   folic acid 1 MG tablet Commonly known as:  FOLVITE Take 1 mg by mouth daily.   methocarbamol 500 MG tablet Commonly known as:  ROBAXIN Take 1 tablet (500 mg total) by mouth every 8 (eight) hours as needed for muscle spasms.   metoprolol 200 MG 24 hr tablet Commonly known as:  TOPROL-XL Take 200 mg by mouth daily.   multivitamin capsule Take 1 capsule by mouth daily.   oxyCODONE 5 MG immediate release tablet Commonly known as:  Oxy IR/ROXICODONE Take 1-2 tablets (5-10 mg total) by mouth every 4 (four) hours as needed for moderate pain or severe pain.   pantoprazole 40 MG tablet Commonly known as:  PROTONIX Take 40 mg by mouth daily.   traZODone 50 MG tablet Commonly known as:  DESYREL Take 50 mg by mouth at bedtime.      Follow-up Information    [name], MD. Schedule an appointment as soon as possible for a visit in 1 week(s).   Specialty:  Orthopedic Surgery Contact information: [address] [place] [address] [phone]           Signed: Wylene Simmer 05/03/2016, 5:55 PM

## 2016-05-03 NOTE — Progress Notes (Signed)
Occupational Therapy Treatment Patient Details Name: Darryl Diaz MRN: [id number] DOB: 10/22/1940 Today's Date: 05/03/2016    History of present illness 75 yo male admitted on 04/29/16 through ED following left hip dislocation and underwent a closed reduction on 04/30/16. PMH significant for left hip replacement following fall 04/01/16, ETOH abuse, depression, HTN, GERD, atrial fib.   OT comments  Pt progressing toward OT goals but remains highly limited by pain. Pt able to maintain sitting at EOB for approximately 15 seconds this session prior to needing to lay down secondary to pain. Educated pt on breathing and relaxation techniques but this is unsuccessful. Pt does report a history of lumbar disc injury and question if this is the origination of pain vs hip. See PT note for further details. Pt able to complete bed level UB bathing/dressing and pericare this session. Additionally, educated on [place] with green Theraband (see below for details) in order to maximize UB strength for use of RW when able. Pt would benefit from continued OT services while admitted to improve independence with ADL. D/C plan remains appropriate. OT will continue to follow acutely.  Follow Up Recommendations  SNF    Equipment Recommendations  3 in 1 bedside commode;Hospital bed;Wheelchair cushion (measurements OT);Wheelchair (measurements OT);Other (comment) (air mattress overlay)    Recommendations for Other Services      Precautions / Restrictions Precautions Precautions: Fall;Posterior Hip Precaution Booklet Issued: Yes (comment) Precaution Comments: posterior hip precautions from previous surgery Required Braces or Orthoses: Knee Immobilizer - Left Knee Immobilizer - Left: On at all times Restrictions Weight Bearing Restrictions: Yes LLE Weight Bearing: Weight bearing as tolerated       Mobility Bed Mobility Overal bed mobility: Needs Assistance Bed Mobility: Supine to Sit;Sit to  Supine     Supine to sit: Min assist;+2 for safety/equipment Sit to supine: Min assist;+2 for safety/equipment   General bed mobility comments: Pt is able to get EOB, but then has increased pain in Left hip when its brought to the floor and pt immediately falls backwards. Pt moves very quickly and is not adherent to hip precautions when he moves backwards.   Transfers                      Balance                                   ADL Overall ADL's : Needs assistance/impaired         Upper Body Bathing: Set up;Bed level       Upper Body Dressing : Set up;Bed level   Lower Body Dressing: Total assistance       Toileting- Clothing Manipulation and Hygiene: Set up;Bed level Toileting - Clothing Manipulation Details (indicate cue type and reason): Able to perform pericare at bed level.        General ADL Comments: Pt able to initiate sitting at EOB and complete scooting up in bed but unable to tolerate sitting at EOB for longer than 15 seconds. Pt able to complete bathing at bed level.      Vision                     Perception     Praxis      Cognition   Behavior During Therapy: [facility] for tasks assessed/performed Overall Cognitive Status: Within Functional Limits for tasks assessed  Extremity/Trunk Assessment               Exercises Total Joint Exercises Quad Sets: AROM;Left;10 reps;Supine Short Arc Quad: AROM;Both;10 reps;Supine Heel Slides: AROM;Left;10 reps;Supine Hip ABduction/ADduction: AROM;Left;10 reps;Supine General Exercises - Upper Extremity Shoulder Flexion: Strengthening;Both;10 reps;Theraband Theraband Level (Shoulder Flexion): Level 3 (Green) Shoulder Horizontal ABduction: Strengthening;10 reps;Theraband;Both Theraband Level (Shoulder Horizontal Abduction): Level 3 (Green) Elbow Flexion: Strengthening;Both;10 reps;Theraband Theraband Level (Elbow Flexion): Level 3 (Green) Other  Exercises Other Exercises: Shoulder press; B UE; strengthening with green theraband   Shoulder Instructions       General Comments      Pertinent Vitals/ Pain       Pain Assessment: 0-10 Pain Score: 10-Worst pain ever Pain Location: left hip when sitting at EOB Pain Descriptors / Indicators: Shooting;Burning;Stabbing Pain Intervention(s): Repositioned;Premedicated before session  Home Living                                          Prior Functioning/Environment              Frequency  Min 2X/week        Progress Toward Goals  OT Goals(current goals can now be found in the care plan section)  Progress towards OT goals: Progressing toward goals  Acute Rehab OT Goals Patient Stated Goal: to have less pain and get home OT Goal Formulation: With patient Time For Goal Achievement: 05/15/16 Potential to Achieve Goals: Good ADL Goals Pt Will Perform Grooming: with set-up;sitting Pt Will Perform Upper Body Bathing: with set-up;sitting Pt Will Perform Lower Body Bathing: with min assist;sit to/from stand Pt Will Transfer to Toilet: with max assist;bedside commode;stand pivot transfer Pt/caregiver will Perform Home Exercise Program: Increased strength;Both right and left upper extremity;With Supervision;With written HEP provided;With theraband Additional ADL Goal #1: Pt will complete bed mobility min (A) level as precuros to adls.   Plan Discharge plan remains appropriate    Co-evaluation    PT/OT/SLP Co-Evaluation/Treatment: Yes Reason for Co-Treatment: Complexity of the patient's impairments (multi-system involvement);For patient/therapist safety PT goals addressed during session: Mobility/safety with mobility OT goals addressed during session: ADL's and self-care      End of Session     Activity Tolerance Patient tolerated treatment well   Patient Left in bed;with call bell/phone within reach;with bed alarm set;with SCD's reapplied   Nurse  Communication          Time: 3582-5189 OT Time Calculation (min): 40 min  Charges: OT General Charges $OT Visit: 1 Procedure OT Treatments $Self Care/Home Management : 23-37 mins  Norman Herrlich, OTR/L [phone] 05/03/2016, 3:28 PM

## 2016-05-03 NOTE — Progress Notes (Signed)
Subjective: 3 Days Post-Op Procedure(s) (LRB): CLOSED REDUCTION HIP (Left) Patient reports pain as moderate.  Pt was unable to be discharged to home Saturday as originally planned.  He had severe pain requiring IV pain medicine.  He struggled with mobilization and required PT / OT consultation.  SNF is recommended for placement upon discharge.  He reports that his pain has improved.  He was able to sit on the edge of the bed today with the therapist.  He c/o moderate to severe pain in the left hip that is worse with WB and better with rest.  Objective: Vital signs in last 24 hours: Temp:  [98.9 F (37.2 C)-99.8 F (37.7 C)] 99 F (37.2 C) (12/26 1455) Pulse Rate:  [75-84] 84 (12/26 1455) Resp:  [18-19] 18 (12/26 0448) BP: (128-150)/(63-85) 128/63 (12/26 1455) SpO2:  [96 %-97 %] 97 % (12/26 1455)  Intake/Output from previous day: 12/25 0701 - 12/26 0700 In: 540 [P.O.:540] Out: 1400 [Urine:1400] Intake/Output this shift: Total I/O In: 526 [P.O.:526] Out: -   No results for input(s): HGB in the last 72 hours. No results for input(s): WBC, RBC, HCT, PLT in the last 72 hours. No results for input(s): NA, K, CL, CO2, BUN, CREATININE, GLUCOSE, CALCIUM in the last 72 hours. No results for input(s): LABPT, INR in the last 72 hours.  PE:  wn wd male in nad.  L LE length symmetric to the right.  No pain iwth IR and ER of the hip.  Skin incision healing well.  No signs of infection.  5/5 strength in PF adn DF of the ankle and toes.  Sens to LT intact at the foot.  Assessment/Plan: 3 Days Post-Op Procedure(s) (LRB): CLOSED REDUCTION HIP (Left) Up with therapy  Plan d/c to SNF tomorrow.  Wylene Simmer 05/03/2016, 5:46 PM

## 2016-05-03 NOTE — Op Note (Signed)
NAME:  BOLIVAR, KORANDA                   ACCOUNT NO.:  MEDICAL RECORD NO.:  [id number]  LOCATION:                                 FACILITY:  PHYSICIAN:  Wylene Simmer, MD        DATE OF BIRTH:  Oct 25, 1940  DATE OF PROCEDURE:  04/30/2016 DATE OF DISCHARGE:                              OPERATIVE REPORT   PREOPERATIVE DIAGNOSIS:  Left prosthetic hip dislocation.  POSTOPERATIVE DIAGNOSIS:  Left prosthetic hip dislocation.  PROCEDURE:  Closed reduction of left hip dislocation under anesthesia.  SURGEON:  Wylene Simmer, MD.  ANESTHESIA:  General.  ESTIMATED BLOOD LOSS:  0.  TOURNIQUET:  None.  COMPLICATIONS:  None apparent.  DISPOSITION:  Extubated, awake, and stable to Recovery.  INDICATION FOR PROCEDURE:  The patient is a 75 year old male, who underwent left hip hemiarthroplasty by Dr. Alvan Dame approximately 1 month ago.  Earlier this evening, he was in bed when he felt pain in his left hip.  He does not know what happened.  He came to the hospital via EMS. Radiographs show a dislocation of the hemiarthroplasty.  He presents now for closed reduction under anesthesia.  He understands the risks and benefits of the alternative treatment options and would like to proceed. He specifically understands risks of failure of the reduction, re- dislocation, and nerve damage.  PROCEDURE IN DETAIL:  After preoperative consent was obtained and the correct operative site was identified, the patient was brought to the operating room supine on a stretcher.  General anesthesia was induced. Surgical time-out was taken.  The left hip was then reduced with flexion, traction, and internal rotation.  There was a palpable clunk and leg lengths were then equal.  AP pelvis radiograph was obtained showing concentric reduction of the prosthetic hemiarthroplasty within the acetabulum.  No other acute injuries were noted.  A knee immobilizer was then applied.  The patient was awakened from anesthesia  and transported to the recovery room in stable condition.  FOLLOWUP PLAN:  The patient will be admitted to the hospital for observation and discharge planning.  He will continue on his Eliquis for DVT prophylaxis.     Wylene Simmer, MD     JH/MEDQ  D:  04/30/2016  T:  04/30/2016  Job:  [id number]

## 2016-05-03 NOTE — Progress Notes (Signed)
Physical Therapy Treatment Patient Details Name: Darryl Diaz MRN: [id number] DOB: 03-12-1941 Today's Date: 05/03/2016    History of Present Illness 75 yo male admitted on 04/29/16 through ED following left hip dislocation and underwent a closed reduction on 04/30/16. PMH significant for left hip replacement following fall 04/01/16, ETOH abuse, depression, HTN, GERD, atrial fib.    PT Comments    Pt continues to be limited with EOB mobility this session. Increased pain in left hip noted with any movement of LLE toward the floor. Pt does, however, move very quickly posteriorly when pain strikes and ends up with head at foot of the bed. Pt is able to move himself proper into bed and roll in order to change linens on bed. Spoke with RN and pt has not received any medications this Am. RN to give medications to see if pt will be able to move better this PM   Follow Up Recommendations  SNF;Supervision/Assistance - 24 hour     Equipment Recommendations  None recommended by PT    Recommendations for Other Services       Precautions / Restrictions Precautions Precautions: Fall;Posterior Hip Precaution Booklet Issued: Yes (comment) Precaution Comments: posterior hip precautions from previous surgery Required Braces or Orthoses: Knee Immobilizer - Left Knee Immobilizer - Left: On at all times Restrictions Weight Bearing Restrictions: Yes LLE Weight Bearing: Weight bearing as tolerated    Mobility  Bed Mobility Overal bed mobility: Needs Assistance Bed Mobility: Supine to Sit;Sit to Supine     Supine to sit: Mod assist;+2 for physical assistance;+2 for safety/equipment Sit to supine: Mod assist;+2 for physical assistance;+2 for safety/equipment   General bed mobility comments: Pt is able to get EOB, but then has increased pain in Left hip when its brought to the floor and pt immediately falls backwards. Pt moves very quickly and is not adherent to hip precautions when he moves  backwards.   Transfers                    Ambulation/Gait                 Stairs            Wheelchair Mobility    Modified Rankin (Stroke Patients Only)       Balance                                    Cognition Arousal/Alertness: Awake/alert Behavior During Therapy: WFL for tasks assessed/performed Overall Cognitive Status: Within Functional Limits for tasks assessed                      Exercises      General Comments        Pertinent Vitals/Pain Pain Assessment: 0-10 Pain Score: 10-Worst pain ever Pain Location: left hip when sitting at EOB Pain Descriptors / Indicators: Shooting;Burning;Stabbing Pain Intervention(s): Repositioned;Monitored during session;Patient requesting pain meds-RN notified    Home Living                      Prior Function            PT Goals (current goals can now be found in the care plan section) Acute Rehab PT Goals Patient Stated Goal: to have less pain and get home Progress towards PT goals: Progressing toward goals    Frequency    Min 5X/week  PT Plan Current plan remains appropriate    Co-evaluation             End of Session Equipment Utilized During Treatment: Gait belt;Left knee immobilizer Activity Tolerance: Patient limited by pain Patient left: in bed;with call bell/phone within reach;with SCD's reapplied     Time: 1155-1209 PT Time Calculation (min) (ACUTE ONLY): 14 min  Charges:  $Therapeutic Activity: 8-22 mins                    G Codes:      Scheryl Marten PT, DPT  [phone]  05/03/2016, 12:54 PM

## 2016-05-03 NOTE — Progress Notes (Signed)
Physical Therapy Treatment Patient Details Name: Darryl Diaz MRN: [id number] DOB: [birth year] Today's Date: 05/03/2016    History of Present Illness 75 yo male admitted on 04/29/16 through ED following left hip dislocation and underwent a closed reduction on 04/30/16. PMH significant for left hip replacement following fall 04/01/16, ETOH abuse, depression, HTN, GERD, atrial fib.    PT Comments    Pt presents with continued difficulty getting to the edge of the bed and sitting. Pt is able to maintain sitting x 15 sec before having to lay back supine. Question whether pain is originating from lumbar region vs hip due to pt's self reporting history of lumbar disc injury. Pt continues to be motivated to improve mobility and strength and makes every attempt to get out of bed but continues to be limited. Pt may benefit from further follow-up regarding lumbar region.    Follow Up Recommendations  SNF;Supervision/Assistance - 24 hour     Equipment Recommendations  None recommended by PT    Recommendations for Other Services       Precautions / Restrictions Precautions Precautions: Fall;Posterior Hip Precaution Booklet Issued: Yes (comment) Precaution Comments: posterior hip precautions from previous surgery Required Braces or Orthoses: Knee Immobilizer - Left Knee Immobilizer - Left: On at all times Restrictions Weight Bearing Restrictions: Yes LLE Weight Bearing: Weight bearing as tolerated    Mobility  Bed Mobility Overal bed mobility: Needs Assistance Bed Mobility: Supine to Sit;Sit to Supine     Supine to sit: Min assist;+2 for safety/equipment Sit to supine: Min assist;+2 for safety/equipment   General bed mobility comments: Pt is able to get EOB, but then has increased pain in Left hip when its brought to the floor and pt immediately falls backwards. Pt moves very quickly and is not adherent to hip precautions when he moves backwards.   Transfers                     Ambulation/Gait                 Stairs            Wheelchair Mobility    Modified Rankin (Stroke Patients Only)       Balance                                    Cognition Arousal/Alertness: Awake/alert Behavior During Therapy: WFL for tasks assessed/performed Overall Cognitive Status: Within Functional Limits for tasks assessed                      Exercises Total Joint Exercises Quad Sets: AROM;Left;10 reps;Supine Short Arc Quad: AROM;Both;10 reps;Supine Heel Slides: AROM;Left;10 reps;Supine Hip ABduction/ADduction: AROM;Left;10 reps;Supine    General Comments General comments (skin integrity, edema, etc.): Pt pain is inconsistent with hip pain following dislocation. 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 Question whether pain may be originating from lumbar region vs huip region.       Pertinent Vitals/Pain Pain Assessment: 0-10 Pain Score: 10-Worst pain ever Pain Location: left hip when sitting at EOB Pain Descriptors / Indicators: Shooting;Burning;Stabbing Pain Intervention(s): Repositioned;Premedicated before session    Home Living                      Prior Function            PT Goals (  current goals can now be found in the care plan section) Acute Rehab PT Goals Patient Stated Goal: to have less pain and get home Progress towards PT goals: Progressing toward goals    Frequency    Min 5X/week      PT Plan Current plan remains appropriate    Co-evaluation   Reason for Co-Treatment: Complexity of the patient's impairments (multi-system involvement);For patient/therapist safety PT goals addressed during session: Mobility/safety with mobility       End of Session Equipment Utilized During Treatment: Gait belt;Left knee immobilizer Activity Tolerance: Patient limited by pain Patient left: in bed;with call bell/phone within reach;with SCD's reapplied     Time:  7573-2256 PT Time Calculation (min) (ACUTE ONLY): 27 min  Charges:  $Therapeutic Activity: 8-22 mins                    G Codes:      Scheryl Marten PT, DPT  [phone]  05/03/2016, 2:46 PM

## 2016-05-04 DIAGNOSIS — T84021D Dislocation of internal left hip prosthesis, subsequent encounter: Secondary | ICD-10-CM | POA: Diagnosis not present

## 2016-05-04 DIAGNOSIS — S72009A Fracture of unspecified part of neck of unspecified femur, initial encounter for closed fracture: Secondary | ICD-10-CM | POA: Diagnosis not present

## 2016-05-04 DIAGNOSIS — K219 Gastro-esophageal reflux disease without esophagitis: Secondary | ICD-10-CM | POA: Diagnosis not present

## 2016-05-04 DIAGNOSIS — M1990 Unspecified osteoarthritis, unspecified site: Secondary | ICD-10-CM | POA: Diagnosis not present

## 2016-05-04 DIAGNOSIS — I4891 Unspecified atrial fibrillation: Secondary | ICD-10-CM | POA: Diagnosis not present

## 2016-05-04 DIAGNOSIS — S72002D Fracture of unspecified part of neck of left femur, subsequent encounter for closed fracture with routine healing: Secondary | ICD-10-CM | POA: Diagnosis not present

## 2016-05-04 DIAGNOSIS — N179 Acute kidney failure, unspecified: Secondary | ICD-10-CM | POA: Diagnosis not present

## 2016-05-04 DIAGNOSIS — Z9181 History of falling: Secondary | ICD-10-CM | POA: Diagnosis not present

## 2016-05-04 DIAGNOSIS — S73005D Unspecified dislocation of left hip, subsequent encounter: Secondary | ICD-10-CM | POA: Diagnosis not present

## 2016-05-04 DIAGNOSIS — Z7901 Long term (current) use of anticoagulants: Secondary | ICD-10-CM | POA: Diagnosis not present

## 2016-05-04 DIAGNOSIS — Z96642 Presence of left artificial hip joint: Secondary | ICD-10-CM | POA: Diagnosis not present

## 2016-05-04 DIAGNOSIS — M6282 Rhabdomyolysis: Secondary | ICD-10-CM | POA: Diagnosis not present

## 2016-05-04 DIAGNOSIS — R531 Weakness: Secondary | ICD-10-CM | POA: Diagnosis not present

## 2016-05-04 DIAGNOSIS — R262 Difficulty in walking, not elsewhere classified: Secondary | ICD-10-CM | POA: Diagnosis not present

## 2016-05-04 DIAGNOSIS — Z6833 Body mass index (BMI) 33.0-33.9, adult: Secondary | ICD-10-CM | POA: Diagnosis not present

## 2016-05-04 DIAGNOSIS — R600 Localized edema: Secondary | ICD-10-CM | POA: Diagnosis not present

## 2016-05-04 DIAGNOSIS — I10 Essential (primary) hypertension: Secondary | ICD-10-CM | POA: Diagnosis not present

## 2016-05-04 DIAGNOSIS — I480 Paroxysmal atrial fibrillation: Secondary | ICD-10-CM | POA: Diagnosis not present

## 2016-05-04 DIAGNOSIS — D500 Iron deficiency anemia secondary to blood loss (chronic): Secondary | ICD-10-CM | POA: Diagnosis not present

## 2016-05-04 DIAGNOSIS — F329 Major depressive disorder, single episode, unspecified: Secondary | ICD-10-CM | POA: Diagnosis not present

## 2016-05-04 DIAGNOSIS — Z8781 Personal history of (healed) traumatic fracture: Secondary | ICD-10-CM | POA: Diagnosis not present

## 2016-05-04 DIAGNOSIS — E785 Hyperlipidemia, unspecified: Secondary | ICD-10-CM | POA: Diagnosis not present

## 2016-05-04 NOTE — Progress Notes (Signed)
Report called to nurse at [facility].

## 2016-05-04 NOTE — NC FL2 (Signed)
[place] MEDICAID FL2 LEVEL OF CARE SCREENING TOOL     IDENTIFICATION  Patient Name: Darryl Diaz Birthdate: 03/28/1941 Sex: male Admission Date (Current Location): 04/29/2016  [facility] [place] Number:  Herbalist and Address:  [facility]. [facility], [place] [address] [place], [place] [address]      Provider Number: [id number]  Attending Physician Name and Address:  Wylene Simmer, MD  Relative Name and Phone Number:       Current Level of Care: Hospital Recommended Level of Care: [place] Prior Approval Number:    Date Approved/Denied:   PASRR Number: [id number]  Discharge Plan: SNF    Current Diagnoses: Patient Active Problem List   Diagnosis Date Noted  . Dislocation of hip, posterior, left, closed ([place]) 04/30/2016  . Hip fracture ([place]) 03/30/2016  . Hypertension 03/30/2016  . Hyperlipidemia 03/30/2016  . GERD (gastroesophageal reflux disease) 03/30/2016  . Osteoarthritis 03/30/2016  . Depression 03/30/2016  . Rhabdomyolysis 03/30/2016  . Leukocytosis 03/30/2016  . Pressure injury of skin 03/30/2016  . Closed fracture of left hip (HCC)     Orientation RESPIRATION BLADDER Height & Weight     Self, Time, Situation, Place  Normal Continent Weight: 237 lb 14 oz (107.9 kg) Height:  6' (182.9 cm)  BEHAVIORAL SYMPTOMS/MOOD NEUROLOGICAL BOWEL NUTRITION STATUS      Continent Diet (diet regular)  AMBULATORY STATUS COMMUNICATION OF NEEDS Skin   Extensive Assist Verbally Normal                       Personal Care Assistance Level of Assistance  Bathing, Feeding, Dressing Bathing Assistance: Limited assistance Feeding assistance: Limited assistance Dressing Assistance: Limited assistance     Functional Limitations Info  Sight, Hearing, Speech Sight Info: Adequate Hearing Info: Adequate Speech Info: Adequate    SPECIAL CARE FACTORS FREQUENCY  PT (By licensed PT), OT (By licensed OT)     PT Frequency: 5x  week OT Frequency: 5x week            Contractures Contractures Info: Not present    Additional Factors Info  Code Status, Allergies Code Status Info: Full Code Allergies Info: Penicillins           Current Medications (05/04/2016):  This is the current hospital active medication list Current Facility-Administered Medications  Medication Dose Route Frequency Provider Last Rate Last Dose  . apixaban (ELIQUIS) tablet 5 mg  5 mg Oral BID Wylene Simmer, MD   5 mg at 05/04/16 1055  . atorvastatin (LIPITOR) tablet 20 mg  20 mg Oral Daily Wylene Simmer, MD   20 mg at 05/04/16 1055  . B-complex with vitamin C tablet 1 tablet  1 tablet Oral Daily Wylene Simmer, MD   1 tablet at 05/04/16 1108  . docusate sodium (COLACE) capsule 100 mg  100 mg Oral BID Wylene Simmer, MD   100 mg at 05/02/16 2129  . escitalopram (LEXAPRO) tablet 10 mg  10 mg Oral Daily Wylene Simmer, MD   10 mg at 05/04/16 1054  . ferrous sulfate tablet 325 mg  325 mg Oral Q breakfast Wylene Simmer, MD   325 mg at 05/04/16 0905  . folic acid (FOLVITE) tablet 1 mg  1 mg Oral Daily Wylene Simmer, MD   1 mg at 05/04/16 1055  . loratadine (CLARITIN) tablet 10 mg  10 mg Oral Daily Wylene Simmer, MD   10 mg at 05/04/16 1055  . methocarbamol (ROBAXIN) tablet 500  mg  500 mg Oral Q6H PRN Nicholes Stairs, MD   500 mg at 05/03/16 1350  . metoprolol succinate (TOPROL-XL) 24 hr tablet 200 mg  200 mg Oral Daily Wylene Simmer, MD   200 mg at 05/04/16 1058  . multivitamin with minerals tablet 1 tablet  1 tablet Oral Daily Wylene Simmer, MD   1 tablet at 05/04/16 1055  . oxyCODONE (Oxy IR/ROXICODONE) immediate release tablet 5-10 mg  5-10 mg Oral Q4H PRN Ardeen Jourdain, PA-C   10 mg at 05/03/16 2108  . pantoprazole (PROTONIX) EC tablet 40 mg  40 mg Oral Daily Wylene Simmer, MD   40 mg at 05/04/16 1055  . traZODone (DESYREL) tablet 50 mg  50 mg Oral QHS Wylene Simmer, MD   50 mg at 05/03/16 2108     Discharge Medications: Please see discharge summary for a  list of discharge medications.  Relevant Imaging Results:  Relevant Lab Results:   Additional Information SSN:[id number]  Wende Neighbors, LCSW

## 2016-05-04 NOTE — Care Management Important Message (Signed)
Important Message  Patient Details  Name: Darryl Diaz MRN: [id number] Date of Birth: [birth year]   Medicare Important Message Given:  Yes    [name] [address] 05/04/2016, 11:42 AM

## 2016-05-04 NOTE — Progress Notes (Signed)
Clinical Social Worker facilitated patient discharge including contacting patient family and facility to confirm patient discharge plans.  Clinical information faxed to facility and family agreeable with plan.  CSW arranged ambulance transport via [facility] to [facility] .  RN to call [phone] for report prior to discharge.  Clinical Social Worker will sign off for now as social work intervention is no longer needed. Please consult [place] again if new need arises.  Rhea Pink, MSW, [place]

## 2016-05-04 NOTE — Progress Notes (Signed)
Physical Therapy Treatment Patient Details Name: Darryl Diaz MRN: [id number] DOB: 06/17/1940 Today's Date: 05/04/2016    History of Present Illness 75 yo male admitted on 04/29/16 through ED following left hip dislocation and underwent a closed reduction on 04/30/16. PMH significant for left hip replacement following fall 04/01/16, ETOH abuse, depression, HTN, GERD, atrial fib.    PT Comments    Patient limited by pain and anxiety this session and unable to achieve/maintain sitting EOB. Tolerated therex well. Continue to progress as tolerated with anticipated d/c to SNF for further skilled PT services.    Follow Up Recommendations  SNF;Supervision/Assistance - 24 hour     Equipment Recommendations  None recommended by PT    Recommendations for Other Services       Precautions / Restrictions Precautions Precautions: Fall;Posterior Hip Precaution Comments: posterior hip precautions from previous surgery; reviewed precautions with pt and he has good understanding Required Braces or Orthoses: Knee Immobilizer - Left Knee Immobilizer - Left: On at all times Restrictions Weight Bearing Restrictions: Yes LLE Weight Bearing: Weight bearing as tolerated    Mobility  Bed Mobility Overal bed mobility: Needs Assistance Bed Mobility: Supine to Sit;Sit to Supine     Supine to sit: Mod assist;HOB elevated Sit to supine: Min assist;+2 for safety/equipment   General bed mobility comments: pt able to get to EOB with assist to lower L LE from bed and assist to elevate trunk into sitting however pt unable to achieve full upright sitting or maintain position due to c/o pain and tends to "throw" himself back onto bed; encouarged pt to attempt again but pt refused  Transfers                    Ambulation/Gait                 Stairs            Wheelchair Mobility    Modified Rankin (Stroke Patients Only)       Balance                                    Cognition Arousal/Alertness: Awake/alert Behavior During Therapy: Anxious;WFL for tasks assessed/performed Overall Cognitive Status: No family/caregiver present to determine baseline cognitive functioning                      Exercises Total Joint Exercises Quad Sets: AROM;10 reps;Both Heel Slides: AROM;Left;10 reps;Supine Hip ABduction/ADduction: AROM;Left;10 reps;Supine    General Comments General comments (skin integrity, edema, etc.): pt has a lot of anxiety about mobility due to anticipation of pain      Pertinent Vitals/Pain Pain Assessment: Faces Faces Pain Scale: Hurts little more (4-6 with mobility) Pain Location: L hip with flexion Pain Descriptors / Indicators: Guarding;Sore;Sharp Pain Intervention(s): Limited activity within patient's tolerance;Monitored during session;Repositioned    Home Living                      Prior Function            PT Goals (current goals can now be found in the care plan section) Acute Rehab PT Goals Patient Stated Goal: go to rehab and go home Progress towards PT goals: Progressing toward goals    Frequency    Min 5X/week      PT Plan Current plan remains appropriate    Co-evaluation  End of Session Equipment Utilized During Treatment: Gait belt Activity Tolerance: Patient limited by pain Patient left: in bed;with call bell/phone within reach     Time: 1343-1406 PT Time Calculation (min) (ACUTE ONLY): 23 min  Charges:  $Therapeutic Exercise: 8-22 mins $Therapeutic Activity: 8-22 mins                    G Codes:      Salina April, PTA Pager: [phone]   05/04/2016, 4:16 PM

## 2016-05-04 NOTE — Progress Notes (Signed)
Pt d/c'd to SNF [facility] via [place] transportation, belongings sent with pt, no family present at time of discharge to facility.

## 2016-05-06 DIAGNOSIS — S73005D Unspecified dislocation of left hip, subsequent encounter: Secondary | ICD-10-CM | POA: Diagnosis not present

## 2016-05-06 DIAGNOSIS — I4891 Unspecified atrial fibrillation: Secondary | ICD-10-CM | POA: Diagnosis not present

## 2016-05-06 DIAGNOSIS — I10 Essential (primary) hypertension: Secondary | ICD-10-CM | POA: Diagnosis not present

## 2016-05-06 DIAGNOSIS — Z9181 History of falling: Secondary | ICD-10-CM | POA: Diagnosis not present

## 2016-05-06 DIAGNOSIS — R262 Difficulty in walking, not elsewhere classified: Secondary | ICD-10-CM | POA: Diagnosis not present

## 2016-05-07 DIAGNOSIS — Z8781 Personal history of (healed) traumatic fracture: Secondary | ICD-10-CM | POA: Diagnosis not present

## 2016-05-07 DIAGNOSIS — I10 Essential (primary) hypertension: Secondary | ICD-10-CM | POA: Diagnosis not present

## 2016-05-07 DIAGNOSIS — D500 Iron deficiency anemia secondary to blood loss (chronic): Secondary | ICD-10-CM | POA: Diagnosis not present

## 2016-05-07 DIAGNOSIS — Z6833 Body mass index (BMI) 33.0-33.9, adult: Secondary | ICD-10-CM | POA: Diagnosis not present

## 2016-05-07 DIAGNOSIS — R600 Localized edema: Secondary | ICD-10-CM | POA: Diagnosis not present

## 2016-05-07 DIAGNOSIS — Z7901 Long term (current) use of anticoagulants: Secondary | ICD-10-CM | POA: Diagnosis not present

## 2016-05-07 DIAGNOSIS — N179 Acute kidney failure, unspecified: Secondary | ICD-10-CM | POA: Diagnosis not present

## 2016-05-07 DIAGNOSIS — I480 Paroxysmal atrial fibrillation: Secondary | ICD-10-CM | POA: Diagnosis not present

## 2016-05-11 DIAGNOSIS — S72002D Fracture of unspecified part of neck of left femur, subsequent encounter for closed fracture with routine healing: Secondary | ICD-10-CM | POA: Diagnosis not present

## 2016-05-11 DIAGNOSIS — E785 Hyperlipidemia, unspecified: Secondary | ICD-10-CM | POA: Diagnosis not present

## 2016-05-11 DIAGNOSIS — I10 Essential (primary) hypertension: Secondary | ICD-10-CM | POA: Diagnosis not present

## 2016-05-11 DIAGNOSIS — I4891 Unspecified atrial fibrillation: Secondary | ICD-10-CM | POA: Diagnosis not present

## 2016-05-13 DIAGNOSIS — Z96642 Presence of left artificial hip joint: Secondary | ICD-10-CM | POA: Diagnosis not present

## 2016-05-13 DIAGNOSIS — Z471 Aftercare following joint replacement surgery: Secondary | ICD-10-CM | POA: Diagnosis not present

## 2016-05-16 DIAGNOSIS — I10 Essential (primary) hypertension: Secondary | ICD-10-CM | POA: Diagnosis not present

## 2016-05-16 DIAGNOSIS — S72002D Fracture of unspecified part of neck of left femur, subsequent encounter for closed fracture with routine healing: Secondary | ICD-10-CM | POA: Diagnosis not present

## 2016-05-16 DIAGNOSIS — F329 Major depressive disorder, single episode, unspecified: Secondary | ICD-10-CM | POA: Diagnosis not present

## 2016-05-16 DIAGNOSIS — W19XXXD Unspecified fall, subsequent encounter: Secondary | ICD-10-CM | POA: Diagnosis not present

## 2016-05-16 DIAGNOSIS — I4891 Unspecified atrial fibrillation: Secondary | ICD-10-CM | POA: Diagnosis not present

## 2016-05-16 DIAGNOSIS — Z96642 Presence of left artificial hip joint: Secondary | ICD-10-CM | POA: Diagnosis not present

## 2016-05-18 DIAGNOSIS — I4891 Unspecified atrial fibrillation: Secondary | ICD-10-CM | POA: Diagnosis not present

## 2016-05-18 DIAGNOSIS — F329 Major depressive disorder, single episode, unspecified: Secondary | ICD-10-CM | POA: Diagnosis not present

## 2016-05-18 DIAGNOSIS — W19XXXD Unspecified fall, subsequent encounter: Secondary | ICD-10-CM | POA: Diagnosis not present

## 2016-05-18 DIAGNOSIS — I10 Essential (primary) hypertension: Secondary | ICD-10-CM | POA: Diagnosis not present

## 2016-05-18 DIAGNOSIS — S72002D Fracture of unspecified part of neck of left femur, subsequent encounter for closed fracture with routine healing: Secondary | ICD-10-CM | POA: Diagnosis not present

## 2016-05-18 DIAGNOSIS — Z96642 Presence of left artificial hip joint: Secondary | ICD-10-CM | POA: Diagnosis not present

## 2016-05-27 DIAGNOSIS — I4891 Unspecified atrial fibrillation: Secondary | ICD-10-CM | POA: Diagnosis not present

## 2016-05-27 DIAGNOSIS — I10 Essential (primary) hypertension: Secondary | ICD-10-CM | POA: Diagnosis not present

## 2016-05-27 DIAGNOSIS — W19XXXD Unspecified fall, subsequent encounter: Secondary | ICD-10-CM | POA: Diagnosis not present

## 2016-05-27 DIAGNOSIS — Z96642 Presence of left artificial hip joint: Secondary | ICD-10-CM | POA: Diagnosis not present

## 2016-05-27 DIAGNOSIS — S72002D Fracture of unspecified part of neck of left femur, subsequent encounter for closed fracture with routine healing: Secondary | ICD-10-CM | POA: Diagnosis not present

## 2016-05-27 DIAGNOSIS — F329 Major depressive disorder, single episode, unspecified: Secondary | ICD-10-CM | POA: Diagnosis not present

## 2016-05-28 DIAGNOSIS — I4891 Unspecified atrial fibrillation: Secondary | ICD-10-CM | POA: Diagnosis not present

## 2016-05-28 DIAGNOSIS — W19XXXD Unspecified fall, subsequent encounter: Secondary | ICD-10-CM | POA: Diagnosis not present

## 2016-05-28 DIAGNOSIS — F329 Major depressive disorder, single episode, unspecified: Secondary | ICD-10-CM | POA: Diagnosis not present

## 2016-05-28 DIAGNOSIS — Z96642 Presence of left artificial hip joint: Secondary | ICD-10-CM | POA: Diagnosis not present

## 2016-05-28 DIAGNOSIS — S72002D Fracture of unspecified part of neck of left femur, subsequent encounter for closed fracture with routine healing: Secondary | ICD-10-CM | POA: Diagnosis not present

## 2016-05-28 DIAGNOSIS — I10 Essential (primary) hypertension: Secondary | ICD-10-CM | POA: Diagnosis not present

## 2016-06-03 DIAGNOSIS — Z125 Encounter for screening for malignant neoplasm of prostate: Secondary | ICD-10-CM | POA: Diagnosis not present

## 2016-06-03 DIAGNOSIS — R8299 Other abnormal findings in urine: Secondary | ICD-10-CM | POA: Diagnosis not present

## 2016-06-03 DIAGNOSIS — Z0000 Encounter for general adult medical examination without abnormal findings: Secondary | ICD-10-CM | POA: Insufficient documentation

## 2016-06-03 DIAGNOSIS — I10 Essential (primary) hypertension: Secondary | ICD-10-CM | POA: Diagnosis not present

## 2016-06-03 DIAGNOSIS — E7800 Pure hypercholesterolemia, unspecified: Secondary | ICD-10-CM | POA: Diagnosis not present

## 2016-06-10 DIAGNOSIS — R7301 Impaired fasting glucose: Secondary | ICD-10-CM | POA: Insufficient documentation

## 2016-06-20 DIAGNOSIS — Z1212 Encounter for screening for malignant neoplasm of rectum: Secondary | ICD-10-CM | POA: Diagnosis not present

## 2016-06-29 DIAGNOSIS — Z471 Aftercare following joint replacement surgery: Secondary | ICD-10-CM | POA: Diagnosis not present

## 2016-06-29 DIAGNOSIS — Z96642 Presence of left artificial hip joint: Secondary | ICD-10-CM | POA: Diagnosis not present

## 2016-06-29 DIAGNOSIS — M19172 Post-traumatic osteoarthritis, left ankle and foot: Secondary | ICD-10-CM | POA: Diagnosis not present

## 2016-08-08 DIAGNOSIS — M19172 Post-traumatic osteoarthritis, left ankle and foot: Secondary | ICD-10-CM | POA: Diagnosis not present

## 2017-02-05 DIAGNOSIS — I480 Paroxysmal atrial fibrillation: Secondary | ICD-10-CM | POA: Insufficient documentation

## 2017-02-05 NOTE — Progress Notes (Signed)
Cardiology Office Note:    Date:  02/06/2017   ID:  Goerge Mohr, DOB [birth year], MRN [id number]  PCP:  [name], MD  Cardiologist:  Dr. Sherren Mocha    Referring MD: [name], MD   Chief Complaint  Patient presents with  . Atrial Fibrillation    Follow-up    History of Present Illness:    Darryl Diaz is a 76 y.o. male with a hx of PAF, HTN, HL.  Last seen by Dr. Sherren Mocha in 8/17 for initial evaluation.  He was previously followed at [facility] by Dr Ronnette Juniper.    Darryl Diaz returns for follow-up. He is here alone. He swims at the [facility] several times a week. He denies any symptoms of chest pain, shortness of breath, syncope, paroxysmal nocturnal dyspnea. He has a prior left ankle injury and has chronic swelling. He denies any significant change. Labs with his PCP recently performed 12/09/16: LDL 68, creatinine 1.1, ALT 22.  Prior CV studies:   The following studies were reviewed today:  None  Past Medical History:  Diagnosis Date  . Alcoholism ([place])    in remission following wife's death  . Atrial fibrillation ([place])   . Fall at home 03/28/2016  . GERD (gastroesophageal reflux disease)   . H/O seasonal allergies   . Hyperlipidemia   . Hypertension   . Insomnia   . 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 OA (osteoarthritis)    hands    Past Surgical History:  Procedure Laterality Date  . ANKLE SURGERY Left 2014   had rods put in   . HIP ARTHROPLASTY Left 04/01/2016   Procedure: ARTHROPLASTY BIPOLAR HIP (HEMIARTHROPLASTY);  Surgeon: Paralee Cancel, MD;  Location: [facility];  Service: Orthopedics;  Laterality: Left;  . HIP CLOSED REDUCTION Left 04/30/2016   Procedure: CLOSED REDUCTION HIP;  Surgeon: Wylene Simmer, MD;  Location: [facility];  Service: Orthopedics;  Laterality: Left;  . TONSILLECTOMY      Current Medications: Current Meds  Medication Sig  . apixaban (ELIQUIS) 5 MG TABS tablet Take 5 mg by mouth 2 (two) times daily.    Marland Kitchen atorvastatin (LIPITOR) 20 MG tablet Take 20 mg by mouth daily.  Marland Kitchen b complex vitamins capsule Take 1 capsule by mouth daily.  . cetirizine (ZYRTEC) 10 MG tablet Take 10 mg by mouth daily.  Marland Kitchen docusate sodium (COLACE) 100 MG capsule Take 1 capsule (100 mg total) by mouth 2 (two) times daily.  Marland Kitchen escitalopram (LEXAPRO) 10 MG tablet Take 10 mg by mouth daily.  . ferrous sulfate 325 (65 FE) MG EC tablet Take 325 mg by mouth daily with breakfast.  . folic acid (FOLVITE) 1 MG tablet Take 1 mg by mouth daily.  . metoprolol (TOPROL-XL) 200 MG 24 hr tablet Take 200 mg by mouth daily.  . Multiple Vitamin (MULTIVITAMIN) capsule Take 1 capsule by mouth daily.  . pantoprazole (PROTONIX) 40 MG tablet Take 40 mg by mouth daily.  . traZODone (DESYREL) 50 MG tablet Take 50 mg by mouth at bedtime.     Allergies:   Penicillins   Social History   Social History  . Marital status: Widowed    Spouse name: N/A  . Number of children: 2  . Years of education: college   Occupational History  . retired    Social History Main Topics  . Smoking status: Former Research scientist (life sciences)  . Smokeless tobacco: Never Used  . Alcohol use No  Comment: 03/2016   i HAD ONE DRINK & THAT WAS THE FIRST DRINK i HAD IN A LONG TIME   . Drug use: No  . Sexual activity: Not Asked   Other Topics Concern  . None   Social History Narrative  . None     Family Hx: The patient's family history includes Arthritis in his mother; Heart disease (age of onset: 6) in his mother; Heart failure in his sister; Heart failure (age of onset: 14) in his father; Hypertension in his mother; Stroke in his maternal aunt.  ROS:   Please see the history of present illness.    ROS All other systems reviewed and are negative.   EKGs/Labs/Other Test Reviewed:    EKG:  EKG is  ordered today.  The ekg ordered today demonstrates Sinus pain, HR 57, leftward axis, nonspecific ST-T wave changes, QTc 434 ms  Recent Labs: 04/04/2016: ALT 33; BUN 10;  Creatinine, Ser 1.09; Magnesium 1.4; Potassium 3.4; Sodium 139 02/06/2017: Hemoglobin 12.1; Platelets 209   Recent Lipid Panel No results found for: CHOL, TRIG, HDL, CHOLHDL, LDLCALC, LDLDIRECT  Physical Exam:    VS:  BP 140/82   Pulse (!) 57   Ht 6' (1.829 m)   Wt 253 lb (114.8 kg)   BMI 34.31 kg/m     Wt Readings from Last 3 Encounters:  02/06/17 253 lb (114.8 kg)  04/30/16 237 lb 14 oz (107.9 kg)  03/30/16 235 lb (106.6 kg)     Physical Exam  Constitutional: He is oriented to person, place, and time. He appears well-developed and well-nourished. No distress.  HENT:  Head: Normocephalic and atraumatic.  Neck: No JVD present.  Cardiovascular: Normal rate and regular rhythm.   No murmur heard. Pulmonary/Chest: Effort normal. He has no rales.  Abdominal: Soft. There is no tenderness.  Musculoskeletal: He exhibits edema (1+ L ankle edema).  Neurological: He is alert and oriented to person, place, and time.  Skin: Skin is warm and dry.  Psychiatric: He has a normal mood and affect.    ASSESSMENT:    1. PAF (paroxysmal atrial fibrillation) ([place])   2. Essential hypertension    PLAN:    In order of problems listed above:  1. PAF (paroxysmal atrial fibrillation) (HCC)  Maintaining normal sinus rhythm. CHADS2-VASc=3.  Continue chronic anticoagulation with Eliquis 5 mg twice a day. Recent creatinine normal. Obtain CBC today.   2. Essential hypertension The patient's blood pressure is controlled on his current regimen.  Continue current therapy.    Dispo:  Return in about 1 year (around 02/06/2018) for Routine Follow Up, w/ Dr. Burt Knack.   Medication Adjustments/Labs and Tests Ordered: Current medicines are reviewed at length with the patient today.  Concerns regarding medicines are outlined above.  Tests Ordered: Orders Placed This Encounter  Procedures  . CBC  . EKG 12-Lead   Medication Changes: No orders of the defined types were placed in this  encounter.   Signed, Darryl Dopp, Darryl Diaz  02/06/2017 5:47 PM    [facility] [place], [place], [place]  [address] Phone: [phone]; Fax: [phone]

## 2017-02-06 ENCOUNTER — Telehealth: Payer: Self-pay | Admitting: *Deleted

## 2017-02-06 ENCOUNTER — Encounter: Payer: Self-pay | Admitting: Physician Assistant

## 2017-02-06 ENCOUNTER — Ambulatory Visit (INDEPENDENT_AMBULATORY_CARE_PROVIDER_SITE_OTHER): Payer: Medicare Other | Admitting: Physician Assistant

## 2017-02-06 ENCOUNTER — Encounter (INDEPENDENT_AMBULATORY_CARE_PROVIDER_SITE_OTHER): Payer: Self-pay

## 2017-02-06 VITALS — BP 140/82 | HR 57 | Ht 72.0 in | Wt 253.0 lb

## 2017-02-06 DIAGNOSIS — I10 Essential (primary) hypertension: Secondary | ICD-10-CM | POA: Diagnosis not present

## 2017-02-06 DIAGNOSIS — I480 Paroxysmal atrial fibrillation: Secondary | ICD-10-CM | POA: Diagnosis not present

## 2017-02-06 LAB — CBC
Hematocrit: 35.4 % — ABNORMAL LOW (ref 37.5–51.0)
Hemoglobin: 12.1 g/dL — ABNORMAL LOW (ref 13.0–17.7)
MCH: 31.4 pg (ref 26.6–33.0)
MCHC: 34.2 g/dL (ref 31.5–35.7)
MCV: 92 fL (ref 79–97)
Platelets: 209 10*3/uL (ref 150–379)
RBC: 3.85 x10E6/uL — ABNORMAL LOW (ref 4.14–5.80)
RDW: 12.8 % (ref 12.3–15.4)
WBC: 8.3 10*3/uL (ref 3.4–10.8)

## 2017-02-06 NOTE — Telephone Encounter (Signed)
-----   Message from Liliane Shi, [place] sent at 02/06/2017  4:01 PM EDT ----- Please call the patient The hemoglobin is stable. Continue current medications and follow-up as planned. Richardson Dopp, PA-C    02/06/2017 4:01 PM

## 2017-02-06 NOTE — Telephone Encounter (Signed)
Pt has been notified of lab results by phone with verbal understanding. Pt thanked me for my call today.   

## 2017-02-06 NOTE — Patient Instructions (Signed)
Medication Instructions:  1. Your physician recommends that you continue on your current medications as directed. Please refer to the Current Medication list given to you today.   Labwork: 1. TODAY CBC  Testing/Procedures: NONE ORDERED TODAY  Follow-Up: Your physician wants you to follow-up in: [facility] will receive a reminder letter in the mail two months in advance. If you don't receive a letter, please call our office to schedule the follow-up appointment.   Any Other Special Instructions Will Be Listed Below (If Applicable).     If you need a refill on your cardiac medications before your next appointment, please call your pharmacy.

## 2017-03-14 DIAGNOSIS — Z23 Encounter for immunization: Secondary | ICD-10-CM | POA: Diagnosis not present

## 2017-06-09 DIAGNOSIS — E7800 Pure hypercholesterolemia, unspecified: Secondary | ICD-10-CM | POA: Diagnosis not present

## 2017-06-09 DIAGNOSIS — R82998 Other abnormal findings in urine: Secondary | ICD-10-CM | POA: Diagnosis not present

## 2017-06-09 DIAGNOSIS — R7301 Impaired fasting glucose: Secondary | ICD-10-CM | POA: Diagnosis not present

## 2017-06-09 DIAGNOSIS — Z125 Encounter for screening for malignant neoplasm of prostate: Secondary | ICD-10-CM | POA: Diagnosis not present

## 2017-06-09 DIAGNOSIS — I10 Essential (primary) hypertension: Secondary | ICD-10-CM | POA: Diagnosis not present

## 2017-06-14 DIAGNOSIS — Z1212 Encounter for screening for malignant neoplasm of rectum: Secondary | ICD-10-CM | POA: Diagnosis not present

## 2017-06-15 ENCOUNTER — Encounter: Payer: Self-pay | Admitting: Internal Medicine

## 2017-07-31 ENCOUNTER — Telehealth: Payer: Self-pay

## 2017-07-31 ENCOUNTER — Ambulatory Visit (INDEPENDENT_AMBULATORY_CARE_PROVIDER_SITE_OTHER): Payer: Medicare Other | Admitting: Internal Medicine

## 2017-07-31 ENCOUNTER — Encounter: Payer: Self-pay | Admitting: Internal Medicine

## 2017-07-31 VITALS — BP 150/80 | HR 84 | Ht 71.0 in | Wt 258.2 lb

## 2017-07-31 DIAGNOSIS — Z7901 Long term (current) use of anticoagulants: Secondary | ICD-10-CM

## 2017-07-31 DIAGNOSIS — R195 Other fecal abnormalities: Secondary | ICD-10-CM

## 2017-07-31 DIAGNOSIS — I480 Paroxysmal atrial fibrillation: Secondary | ICD-10-CM

## 2017-07-31 NOTE — Progress Notes (Signed)
Pt at low risk of holding apixaban x 48 hours for colonoscopy. Resume when ok'd by gastroenterology. thanks

## 2017-07-31 NOTE — Telephone Encounter (Signed)
Patient with diagnosis of Afib on Eliquis for anticoagulation.    Procedure: colonoscopy Date of procedure: 08/07/17  CHADS2-VASc score of  3 (CHF, HTN, AGE, DM2, stroke/tia x 2, CAD, AGE, male)  CrCl 19ml/min  Per office protocol, patient can hold Eliquis for 24 hours prior to procedure.

## 2017-07-31 NOTE — Telephone Encounter (Signed)
Routed to pharmacy pool.

## 2017-07-31 NOTE — Telephone Encounter (Signed)
       Request for surgical clearance:     Endoscopy Procedure  What type of surgery is being performed?     colonsocpy  When is this surgery scheduled?     08/07/17  What type of clearance is required ?   Pharmacy  Are there any medications that need to be held prior to surgery and how long? Eliquis  Practice name and name of physician performing surgery?      [facility]  What is your office phone and fax number?      Phone- [phone]  [phone]  Anesthesia type (None, local, MAC, general) ?       MAC

## 2017-07-31 NOTE — Progress Notes (Signed)
Darryl Diaz 77 y.o. 1940/11/01 [id number] Referred by: Darryl Pao, MD  Assessment & Plan:   Encounter Diagnoses  Name Primary?  . Heme + stool iFOBT Yes  . Long term current use of anticoagulant - Eliquis   . PAF (paroxysmal atrial fibrillation) (HCC)     Colonoscopy is appropriate to evaluate heme positive stool.  It would be best to hold Eliquis 2 days before and anticipate doing that and will clarify with cardiology.  I did explain that he has a normal routine risks of colonoscopy to include perforation medication reaction bleeding etc. as well as the rare but real possibility of a stroke off of his Eliquis.  We will request his previous colonoscopies for completeness that we will not change what we do I appreciate the opportunity to care for this patient CC: Tisovec, Darryl Him, MD   Subjective:   Chief Complaint: Heme positive stool  HPI The patient is a 77 year old divorced white man here because of a heme positive stool, detected at Dr. Loren Diaz office on routine screening.  He reports that 5 years ago approximately he had a colonoscopy and had previous colonoscopies all of which were negative, when he lived in [place].  His hemoglobin is normal at 13.2 his MCV is mildly elevated at 100 platelets normal white count normal.  He has a history of alcoholism and is in recovery going to AA.  He has no other active GI symptoms though about once a year he might have some bright red blood when he wipes and thinks he has hemorrhoids.  Bowel habits are regular except for rare constipation may be once or twice a year but no changes.  All other GI review of systems are negative at this time. Allergies  Allergen Reactions  . Penicillins     Childhood allergy Has patient had a PCN reaction causing immediate rash, facial/tongue/throat swelling, SOB or lightheadedness with hypotension:YES Has patient had a PCN reaction causing severe rash involving mucus membranes or skin  necrosis: NO Has patient had a PCN reaction that required hospitalization NO Has patient had a PCN reaction occurring within the last 10 years: NO If all of the above answers are "NO", then may proceed with Cephalosporin use.   Current Meds  Medication Sig  . apixaban (ELIQUIS) 5 MG TABS tablet Take 5 mg by mouth 2 (two) times daily.  Marland Kitchen atorvastatin (LIPITOR) 20 MG tablet Take 20 mg by mouth daily.  Marland Kitchen b complex vitamins capsule Take 1 capsule by mouth daily.  Marland Kitchen escitalopram (LEXAPRO) 10 MG tablet Take 10 mg by mouth daily.  . ferrous sulfate 325 (65 FE) MG EC tablet Take 325 mg by mouth daily with breakfast.  . folic acid (FOLVITE) 1 MG tablet Take 1 mg by mouth daily.  . metoprolol (TOPROL-XL) 200 MG 24 hr tablet Take 200 mg by mouth daily.  . Multiple Vitamin (MULTIVITAMIN) capsule Take 1 capsule by mouth daily.  . pantoprazole (PROTONIX) 40 MG tablet Take 40 mg by mouth daily.  . traZODone (DESYREL) 50 MG tablet Take 50 mg by mouth at bedtime.   Past Medical History:  Diagnosis Date  . Alcoholism ([place])    in remission following wife's death  . Atrial fibrillation ([place])   . Fall at home 03/28/2016  . GERD (gastroesophageal reflux disease)   . H/O seasonal allergies   . Hyperlipidemia   . Hypertension   . Insomnia   . 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  OA (osteoarthritis)    hands   Past Surgical History:  Procedure Laterality Date  . ANKLE SURGERY Left 2014   had rods put in   . HIP ARTHROPLASTY Left 04/01/2016   Procedure: ARTHROPLASTY BIPOLAR HIP (HEMIARTHROPLASTY);  Surgeon: Paralee Cancel, MD;  Location: [facility];  Service: Orthopedics;  Laterality: Left;  . HIP CLOSED REDUCTION Left 04/30/2016   Procedure: CLOSED REDUCTION HIP;  Surgeon: Wylene Simmer, MD;  Location: [facility];  Service: Orthopedics;  Laterality: Left;  . TONSILLECTOMY     Social History   Social History Narrative   The patient is divorced w/ 1 son 1 daughter   He is a retired  Regulatory affairs officer, he lived in [place] but [employer] including windows on the world in the [place] room in [place]   He moved to [place] to be near his sister who lives in [place] alcoholic member of AA   2 caffeinated beverages daily prior smoker no drugs   family history includes Arthritis in his mother; Heart disease (age of onset: 15) in his mother; Heart failure in his sister; Heart failure (age of onset: 49) in his father; Hypertension in his mother; Stroke in his maternal aunt.   Review of Systems Chronic swelling of the ankles, excessive urination, some urinary leakage dyspnea at times and osteoarthritis all other review of systems negative or as per HPI  Objective:   Physical Exam @BP  (!) 150/80 (BP Location: Left Arm, Patient Position: Sitting, Cuff Size: Normal)   Pulse 84   Ht 5\' 11"  (1.803 m) Comment: height measured without shoes  Wt 258 lb 4 oz (117.1 kg)   BMI 36.02 kg/m @  General:  Well-developed, well-nourished and in no acute distress Eyes:  anicteric.  Lungs: Clear to auscultation bilaterally. Heart:  S1S2, no rubs, murmurs, gallops.  It sounds like he is in sinus rhythm Abdomen:  soft, non-tender, no hepatosplenomegaly, hernia, or mass and BS+.  Rectal: Deferred until colonoscopy Extremities:   Bilateral 1+ nonpitting  edema lower extremities into the lower pretibial area with deformed medial malleolus on the left status post ankle fracture, cyanosis or clubbing Neuro:  A&O x 3.     Data Reviewed: See HPI.  Primary care notes are reviewed as well.  Follow-up 2018 cardiology note Reviewed also

## 2017-07-31 NOTE — Patient Instructions (Signed)
You have been scheduled for a colonoscopy. Please follow written instructions given to you at your visit today.  Please pick up your prep supplies at the pharmacy. If you use inhalers (even only as needed), please bring them with you on the day of your procedure.   You will be contaced by our office prior to your procedure for directions on holding your Eliquis.  If you do not hear from our office by this Thursday please call [phone] to discuss.   I appreciate the opportunity to care for you. Silvano Rusk, MD, [facility]

## 2017-08-01 NOTE — Telephone Encounter (Signed)
I spoke with Dr Carlean Purl and he said to hold the Eliquis 2 days as Dr Burt Knack messaged back. Patient informed.

## 2017-08-01 NOTE — Progress Notes (Signed)
Patient informed and wrote it down.

## 2017-08-07 ENCOUNTER — Ambulatory Visit (AMBULATORY_SURGERY_CENTER): Payer: Medicare Other | Admitting: Internal Medicine

## 2017-08-07 ENCOUNTER — Encounter: Payer: Self-pay | Admitting: Internal Medicine

## 2017-08-07 ENCOUNTER — Other Ambulatory Visit: Payer: Self-pay

## 2017-08-07 VITALS — BP 146/72 | HR 65 | Temp 97.5°F | Resp 13 | Ht 71.0 in | Wt 258.0 lb

## 2017-08-07 DIAGNOSIS — R195 Other fecal abnormalities: Secondary | ICD-10-CM

## 2017-08-07 DIAGNOSIS — D125 Benign neoplasm of sigmoid colon: Secondary | ICD-10-CM

## 2017-08-07 DIAGNOSIS — I10 Essential (primary) hypertension: Secondary | ICD-10-CM | POA: Diagnosis not present

## 2017-08-07 DIAGNOSIS — K219 Gastro-esophageal reflux disease without esophagitis: Secondary | ICD-10-CM | POA: Diagnosis not present

## 2017-08-07 DIAGNOSIS — D124 Benign neoplasm of descending colon: Secondary | ICD-10-CM

## 2017-08-07 DIAGNOSIS — I4891 Unspecified atrial fibrillation: Secondary | ICD-10-CM | POA: Diagnosis not present

## 2017-08-07 MED ORDER — SODIUM CHLORIDE 0.9 % IV SOLN: 500.0000 mL | Freq: Once | INTRAVENOUS | Status: DC

## 2017-08-07 NOTE — Op Note (Signed)
[facility] Patient Name: Darryl Diaz Procedure Date: 08/07/2017 7:49 AM MRN: [id number] Endoscopist: Gatha Mayer , MD Age: 77 Referring MD:  Date of Birth: 1940/09/28 Gender: Male Account #: [id number] Procedure:                Colonoscopy Indications:              Positive fecal immunochemical test Medicines:                Propofol per Anesthesia, Monitored Anesthesia Care Procedure:                Pre-Anesthesia Assessment:                           - Prior to the procedure, a History and Physical                            was performed, and patient medications and                            allergies were reviewed. The patient's tolerance of                            previous anesthesia was also reviewed. The risks                            and benefits of the procedure and the sedation                            options and risks were discussed with the patient.                            All questions were answered, and informed consent                            was obtained. Prior Anticoagulants: The patient                            last took Eliquis (apixaban) 2 days prior to the                            procedure. ASA Grade Assessment: II - A patient                            with mild systemic disease. After reviewing the                            risks and benefits, the patient was deemed in                            satisfactory condition to undergo the procedure.                           After obtaining informed consent, the colonoscope  was passed under direct vision. Throughout the                            procedure, the patient's blood pressure, pulse, and                            oxygen saturations were monitored continuously. The                            Colonoscope was introduced through the anus and                            advanced to the the cecum, identified by                            appendiceal  orifice and ileocecal valve. The                            colonoscopy was performed without difficulty. The                            patient tolerated the procedure well. The quality                            of the bowel preparation was good. The bowel                            preparation used was Miralax. The ileocecal valve,                            appendiceal orifice, and rectum were photographed. Scope In: 8:12:34 AM Scope Out: 8:30:37 AM Scope Withdrawal Time: 0 hours 15 minutes 42 seconds  Total Procedure Duration: 0 hours 18 minutes 3 seconds  Findings:                 The perianal and digital rectal examinations were                            normal. Pertinent negatives include normal prostate                            (size, shape, and consistency).                           A 8 mm polyp was found in the sigmoid colon. The                            polyp was pedunculated. The polyp was removed with                            a hot snare. Resection and retrieval were complete.                            Verification of patient identification for  the                            specimen was done. To prevent bleeding after the                            polypectomy, one hemostatic clip was successfully                            placed (MR conditional). There was no bleeding                            during, or at the end, of the procedure.                           A 5 mm polyp was found in the descending colon. The                            polyp was flat. The polyp was removed with a cold                            snare. Resection and retrieval were complete.                            Verification of patient identification for the                            specimen was done. Estimated blood loss was minimal.                           Multiple small and large-mouthed diverticula were                            found in the left colon and right colon.                            External and internal hemorrhoids were found during                            retroflexion.                           Two small localized angiodysplastic lesions without                            bleeding were found in the cecum.                           The exam was otherwise without abnormality on                            direct and retroflexion views. Complications:            No immediate complications. Estimated Blood Loss:     Estimated blood loss was minimal. Impression:               -  One 8 mm polyp in the sigmoid colon, removed with                            a hot snare. Resected and retrieved. Clip (MR                            conditional) was placed.                           - One 5 mm polyp in the descending colon, removed                            with a cold snare. Resected and retrieved.                           - Diverticulosis in the left colon and in the right                            colon.                           - External and internal hemorrhoids.                           - Two non-bleeding colonic angiodysplastic lesions.                            Tiny adjacent and in cecum                           - The examination was otherwise normal on direct                            and retroflexion views. Recommendation:           - Patient has a contact number available for                            emergencies. The signs and symptoms of potential                            delayed complications were discussed with the                            patient. Return to normal activities tomorrow.                            Written discharge instructions were provided to the                            patient.                           - Resume previous diet.                           -  Continue present medications.                           - Resume Eliquis (apixaban) at prior dose tomorrow.                           - No recommendation at this  time regarding repeat                            colonoscopy due to age. Probably does not need a                            repeat exam - will decide after pathology review                           - Await pathology results.                           Stop annual hemoccults Gatha Mayer, MD 08/07/2017 8:39:31 AM This report has been signed electronically.

## 2017-08-07 NOTE — Progress Notes (Signed)
Called to room to assist during endoscopic procedure.  Patient ID and intended procedure confirmed with present staff. Received instructions for my participation in the procedure from the performing physician.  

## 2017-08-07 NOTE — Progress Notes (Signed)
Report given to PACU, vss 

## 2017-08-07 NOTE — Progress Notes (Signed)
Pt's states no medical or surgical changes since previsit or office visit. 

## 2017-08-07 NOTE — Patient Instructions (Addendum)
I found and removed 2 small polyps. 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  All but the diverticulosis could cause blood in the stool.  Resume Eliquis tomorrow.  I will let you know pathology results and when to have another routine colonoscopy by mail and/or My Chart.  Do not do any more stool tests for blood on a routine basis.  I appreciate the opportunity to care for you. Gatha Mayer, MD, FACG YOU HAD AN ENDOSCOPIC PROCEDURE TODAY AT [facility]:   Refer to the procedure report that was given to you for any specific questions about what was found during the examination.  If the procedure report does not answer your questions, please call your gastroenterologist to clarify.  If you requested that your care partner not be given the details of your procedure findings, then the procedure report has been included in a sealed envelope for you to review at your convenience later.  YOU SHOULD EXPECT: Some feelings of bloating in the abdomen. Passage of more gas than usual.  Walking can help get rid of the air that was put into your GI tract during the procedure and reduce the bloating. If you had a lower endoscopy (such as a colonoscopy or flexible sigmoidoscopy) you may notice spotting of blood in your stool or on the toilet paper. If you underwent a bowel prep for your procedure, you may not have a normal bowel movement for a few days.  Please Note:  You might notice some irritation and congestion in your nose or some drainage.  This is from the oxygen used during your procedure.  There is no need for concern and it should clear up in a day or so.  SYMPTOMS TO REPORT IMMEDIATELY:   Following lower endoscopy (colonoscopy or flexible sigmoidoscopy):  Excessive amounts of blood in the stool  Significant tenderness or worsening of abdominal pains  Swelling of the abdomen that is new, acute  Fever of  100F or higher  For urgent or emergent issues, a gastroenterologist can be reached at any hour by calling [phone].   DIET:  We do recommend a small meal at first, but then you may proceed to your regular diet.  Drink plenty of fluids but you should avoid alcoholic beverages for 24 hours.  MEDICATIONS: Continue present medications. Resume Eliquis (Apiaban) at prior dose tomorrow.  Please see handouts given to you by your recovery nurse. Clip card given to patient with instructions.  Stop annual hemoccults.  ACTIVITY:  You should plan to take it easy for the rest of today and you should NOT DRIVE or use heavy machinery until tomorrow (because of the sedation medicines used during the test).    FOLLOW UP: Our staff will call the number listed on your records the next business day following your procedure to check on you and address any questions or concerns that you may have regarding the information given to you following your procedure. If we do not reach you, we will leave a message.  However, if you are feeling well and you are not experiencing any problems, there is no need to return our call.  We will assume that you have returned to your regular daily activities without incident.  If any biopsies were taken you will be contacted by phone or by letter within the next 1-3 weeks.  Please call us at [phone] if you have not heard about the  biopsies in 3 weeks.   Thank you for allowing [place] to provide for your healthcare needs today.  SIGNATURES/CONFIDENTIALITY: You and/or your care partner have signed paperwork which will be entered into your electronic medical record.  These signatures attest to the fact that that the information above on your After Visit Summary has been reviewed and is understood.  Full responsibility of the confidentiality of this discharge information lies with you and/or your care-partner.

## 2017-08-08 ENCOUNTER — Telehealth: Payer: Self-pay

## 2017-08-08 NOTE — Telephone Encounter (Signed)
  Follow up Call-  Call back number 08/07/2017  Post procedure Call Back phone  # [phone]  Permission to leave phone message Yes     Patient questions:  Do you have a fever, pain , or abdominal swelling? No. Pain Score  0 *  Have you tolerated food without any problems? Yes.    Have you been able to return to your normal activities? Yes.    Do you have any questions about your discharge instructions: Diet   No. Medications  No. Follow up visit  No.  Do you have questions or concerns about your Care? No.  Actions: * If pain score is 4 or above: No action needed, pain <4.

## 2017-08-10 ENCOUNTER — Encounter: Payer: Self-pay | Admitting: Internal Medicine

## 2017-08-10 DIAGNOSIS — Z86010 Personal history of colonic polyps: Secondary | ICD-10-CM

## 2017-08-10 DIAGNOSIS — Z860101 Personal history of adenomatous and serrated colon polyps: Secondary | ICD-10-CM

## 2017-08-10 HISTORY — DX: Personal history of colonic polyps: Z86.010

## 2017-08-10 HISTORY — DX: Personal history of adenomatous and serrated colon polyps: Z86.0101

## 2017-08-10 NOTE — Progress Notes (Signed)
2 subcm adenomas Records review - 2000 colon polyp no pathology 2003 NL colonoscopy 2009 colonoscopy benign mucosal polyps  Would not recall given findings, hx and age

## 2017-09-19 ENCOUNTER — Encounter: Payer: Medicare Other | Admitting: Internal Medicine

## 2018-03-01 DIAGNOSIS — Z23 Encounter for immunization: Secondary | ICD-10-CM | POA: Diagnosis not present

## 2018-06-11 DIAGNOSIS — R7301 Impaired fasting glucose: Secondary | ICD-10-CM | POA: Diagnosis not present

## 2018-06-11 DIAGNOSIS — I10 Essential (primary) hypertension: Secondary | ICD-10-CM | POA: Diagnosis not present

## 2018-06-11 DIAGNOSIS — Z125 Encounter for screening for malignant neoplasm of prostate: Secondary | ICD-10-CM | POA: Diagnosis not present

## 2018-06-11 DIAGNOSIS — E7800 Pure hypercholesterolemia, unspecified: Secondary | ICD-10-CM | POA: Diagnosis not present

## 2018-06-11 DIAGNOSIS — R82998 Other abnormal findings in urine: Secondary | ICD-10-CM | POA: Diagnosis not present

## 2018-06-18 DIAGNOSIS — Z8781 Personal history of (healed) traumatic fracture: Secondary | ICD-10-CM | POA: Diagnosis not present

## 2018-06-18 DIAGNOSIS — I10 Essential (primary) hypertension: Secondary | ICD-10-CM | POA: Diagnosis not present

## 2018-06-18 DIAGNOSIS — Z1331 Encounter for screening for depression: Secondary | ICD-10-CM | POA: Diagnosis not present

## 2018-06-18 DIAGNOSIS — E7800 Pure hypercholesterolemia, unspecified: Secondary | ICD-10-CM | POA: Diagnosis not present

## 2018-06-18 DIAGNOSIS — Z6833 Body mass index (BMI) 33.0-33.9, adult: Secondary | ICD-10-CM | POA: Diagnosis not present

## 2018-06-18 DIAGNOSIS — Z7901 Long term (current) use of anticoagulants: Secondary | ICD-10-CM | POA: Diagnosis not present

## 2018-06-18 DIAGNOSIS — D126 Benign neoplasm of colon, unspecified: Secondary | ICD-10-CM | POA: Diagnosis not present

## 2018-06-18 DIAGNOSIS — D500 Iron deficiency anemia secondary to blood loss (chronic): Secondary | ICD-10-CM | POA: Diagnosis not present

## 2018-06-18 DIAGNOSIS — Z0000 Encounter for general adult medical examination without abnormal findings: Secondary | ICD-10-CM | POA: Diagnosis not present

## 2018-06-18 DIAGNOSIS — I480 Paroxysmal atrial fibrillation: Secondary | ICD-10-CM | POA: Diagnosis not present

## 2018-06-18 DIAGNOSIS — Z23 Encounter for immunization: Secondary | ICD-10-CM | POA: Diagnosis not present

## 2018-06-18 DIAGNOSIS — R7301 Impaired fasting glucose: Secondary | ICD-10-CM | POA: Diagnosis not present

## 2018-06-18 DIAGNOSIS — R600 Localized edema: Secondary | ICD-10-CM | POA: Diagnosis not present

## 2018-08-11 ENCOUNTER — Observation Stay (HOSPITAL_COMMUNITY)
Admission: EM | Admit: 2018-08-11 | Discharge: 2018-08-12 | Disposition: A | Discharge status: Home / Self Care | Payer: Medicare Other | Attending: Internal Medicine | Admitting: Internal Medicine

## 2018-08-11 ENCOUNTER — Emergency Department (HOSPITAL_COMMUNITY): Payer: Medicare Other

## 2018-08-11 ENCOUNTER — Other Ambulatory Visit: Payer: Self-pay

## 2018-08-11 ENCOUNTER — Encounter (HOSPITAL_COMMUNITY): Payer: Self-pay

## 2018-08-11 DIAGNOSIS — D649 Anemia, unspecified: Secondary | ICD-10-CM | POA: Diagnosis present

## 2018-08-11 DIAGNOSIS — I700 Atherosclerosis of aorta: Secondary | ICD-10-CM | POA: Insufficient documentation

## 2018-08-11 DIAGNOSIS — S40021A Contusion of right upper arm, initial encounter: Secondary | ICD-10-CM | POA: Diagnosis not present

## 2018-08-11 DIAGNOSIS — G4700 Insomnia, unspecified: Secondary | ICD-10-CM | POA: Diagnosis not present

## 2018-08-11 DIAGNOSIS — S2241XA Multiple fractures of ribs, right side, initial encounter for closed fracture: Secondary | ICD-10-CM | POA: Diagnosis not present

## 2018-08-11 DIAGNOSIS — E785 Hyperlipidemia, unspecified: Secondary | ICD-10-CM | POA: Insufficient documentation

## 2018-08-11 DIAGNOSIS — M1611 Unilateral primary osteoarthritis, right hip: Secondary | ICD-10-CM | POA: Diagnosis not present

## 2018-08-11 DIAGNOSIS — Z7901 Long term (current) use of anticoagulants: Secondary | ICD-10-CM | POA: Diagnosis not present

## 2018-08-11 DIAGNOSIS — K7460 Unspecified cirrhosis of liver: Secondary | ICD-10-CM | POA: Diagnosis not present

## 2018-08-11 DIAGNOSIS — K760 Fatty (change of) liver, not elsewhere classified: Secondary | ICD-10-CM | POA: Diagnosis not present

## 2018-08-11 DIAGNOSIS — Z8249 Family history of ischemic heart disease and other diseases of the circulatory system: Secondary | ICD-10-CM | POA: Diagnosis not present

## 2018-08-11 DIAGNOSIS — D500 Iron deficiency anemia secondary to blood loss (chronic): Secondary | ICD-10-CM | POA: Diagnosis not present

## 2018-08-11 DIAGNOSIS — J90 Pleural effusion, not elsewhere classified: Secondary | ICD-10-CM | POA: Diagnosis not present

## 2018-08-11 DIAGNOSIS — Z96642 Presence of left artificial hip joint: Secondary | ICD-10-CM | POA: Insufficient documentation

## 2018-08-11 DIAGNOSIS — K429 Umbilical hernia without obstruction or gangrene: Secondary | ICD-10-CM | POA: Insufficient documentation

## 2018-08-11 DIAGNOSIS — I480 Paroxysmal atrial fibrillation: Secondary | ICD-10-CM | POA: Diagnosis not present

## 2018-08-11 DIAGNOSIS — E876 Hypokalemia: Secondary | ICD-10-CM | POA: Insufficient documentation

## 2018-08-11 DIAGNOSIS — W19XXXA Unspecified fall, initial encounter: Secondary | ICD-10-CM | POA: Diagnosis not present

## 2018-08-11 DIAGNOSIS — F329 Major depressive disorder, single episode, unspecified: Secondary | ICD-10-CM | POA: Diagnosis not present

## 2018-08-11 DIAGNOSIS — R0602 Shortness of breath: Secondary | ICD-10-CM | POA: Diagnosis not present

## 2018-08-11 DIAGNOSIS — I6782 Cerebral ischemia: Secondary | ICD-10-CM | POA: Insufficient documentation

## 2018-08-11 DIAGNOSIS — Z87891 Personal history of nicotine dependence: Secondary | ICD-10-CM | POA: Insufficient documentation

## 2018-08-11 DIAGNOSIS — R5381 Other malaise: Secondary | ICD-10-CM | POA: Diagnosis not present

## 2018-08-11 DIAGNOSIS — D62 Acute posthemorrhagic anemia: Principal | ICD-10-CM | POA: Insufficient documentation

## 2018-08-11 DIAGNOSIS — I499 Cardiac arrhythmia, unspecified: Secondary | ICD-10-CM | POA: Diagnosis not present

## 2018-08-11 DIAGNOSIS — Z79899 Other long term (current) drug therapy: Secondary | ICD-10-CM | POA: Insufficient documentation

## 2018-08-11 DIAGNOSIS — R52 Pain, unspecified: Secondary | ICD-10-CM | POA: Diagnosis not present

## 2018-08-11 DIAGNOSIS — I10 Essential (primary) hypertension: Secondary | ICD-10-CM | POA: Diagnosis not present

## 2018-08-11 DIAGNOSIS — S301XXA Contusion of abdominal wall, initial encounter: Secondary | ICD-10-CM

## 2018-08-11 DIAGNOSIS — K219 Gastro-esophageal reflux disease without esophagitis: Secondary | ICD-10-CM | POA: Diagnosis not present

## 2018-08-11 DIAGNOSIS — K8020 Calculus of gallbladder without cholecystitis without obstruction: Secondary | ICD-10-CM | POA: Diagnosis not present

## 2018-08-11 DIAGNOSIS — M79603 Pain in arm, unspecified: Secondary | ICD-10-CM | POA: Diagnosis not present

## 2018-08-11 DIAGNOSIS — S0990XA Unspecified injury of head, initial encounter: Secondary | ICD-10-CM | POA: Diagnosis not present

## 2018-08-11 DIAGNOSIS — F32A Depression, unspecified: Secondary | ICD-10-CM | POA: Diagnosis present

## 2018-08-11 LAB — URINALYSIS, ROUTINE W REFLEX MICROSCOPIC
Bacteria, UA: NONE SEEN
Bilirubin Urine: NEGATIVE
Glucose, UA: NEGATIVE mg/dL
Ketones, ur: NEGATIVE mg/dL
Leukocytes,Ua: NEGATIVE
Nitrite: NEGATIVE
Protein, ur: NEGATIVE mg/dL
Specific Gravity, Urine: 1.005 (ref 1.005–1.030)
pH: 6 (ref 5.0–8.0)

## 2018-08-11 LAB — PROTIME-INR
INR: 1.5 — ABNORMAL HIGH (ref 0.8–1.2)
Prothrombin Time: 17.9 seconds — ABNORMAL HIGH (ref 11.4–15.2)

## 2018-08-11 LAB — CBC WITH DIFFERENTIAL/PLATELET
Abs Immature Granulocytes: 0.04 10*3/uL (ref 0.00–0.07)
Basophils Absolute: 0 10*3/uL (ref 0.0–0.1)
Basophils Relative: 1 %
Eosinophils Absolute: 0.2 10*3/uL (ref 0.0–0.5)
Eosinophils Relative: 2 %
HCT: 25.1 % — ABNORMAL LOW (ref 39.0–52.0)
Hemoglobin: 7.9 g/dL — ABNORMAL LOW (ref 13.0–17.0)
Immature Granulocytes: 1 %
Lymphocytes Relative: 16 %
Lymphs Abs: 1.1 10*3/uL (ref 0.7–4.0)
MCH: 30.4 pg (ref 26.0–34.0)
MCHC: 31.5 g/dL (ref 30.0–36.0)
MCV: 96.5 fL (ref 80.0–100.0)
Monocytes Absolute: 0.5 10*3/uL (ref 0.1–1.0)
Monocytes Relative: 7 %
Neutro Abs: 4.8 10*3/uL (ref 1.7–7.7)
Neutrophils Relative %: 73 %
Platelets: 93 10*3/uL — ABNORMAL LOW (ref 150–400)
RBC: 2.6 MIL/uL — ABNORMAL LOW (ref 4.22–5.81)
RDW: 15.7 % — ABNORMAL HIGH (ref 11.5–15.5)
WBC: 6.5 10*3/uL (ref 4.0–10.5)
nRBC: 0 % (ref 0.0–0.2)

## 2018-08-11 LAB — BASIC METABOLIC PANEL
Anion gap: 11 (ref 5–15)
BUN: 22 mg/dL (ref 8–23)
CO2: 23 mmol/L (ref 22–32)
Calcium: 9.1 mg/dL (ref 8.9–10.3)
Chloride: 95 mmol/L — ABNORMAL LOW (ref 98–111)
Creatinine, Ser: 1.34 mg/dL — ABNORMAL HIGH (ref 0.61–1.24)
GFR calc Af Amer: 59 mL/min — ABNORMAL LOW (ref >60)
GFR calc non Af Amer: 51 mL/min — ABNORMAL LOW (ref >60)
Glucose, Bld: 114 mg/dL — ABNORMAL HIGH (ref 70–99)
Potassium: 3.2 mmol/L — ABNORMAL LOW (ref 3.5–5.1)
Sodium: 129 mmol/L — ABNORMAL LOW (ref 135–145)

## 2018-08-11 LAB — TROPONIN I: Troponin I: <0.03 ng/mL (ref <0.03)

## 2018-08-11 MED ORDER — IOHEXOL 300 MG/ML  SOLN
100.0000 mL | Freq: Once | INTRAMUSCULAR | Status: AC | PRN
  Administered 2018-08-11: 100 mL via INTRAVENOUS

## 2018-08-11 MED ORDER — ACETAMINOPHEN 650 MG RE SUPP: 650.0000 mg | Freq: Four times a day (QID) | RECTAL | Status: DC | PRN

## 2018-08-11 MED ORDER — PANTOPRAZOLE SODIUM 40 MG PO TBEC
40.0000 mg | DELAYED_RELEASE_TABLET | Freq: Every day | ORAL | Status: DC
  Administered 2018-08-12: 40 mg via ORAL
  Filled 2018-08-11: qty 1

## 2018-08-11 MED ORDER — ZOLPIDEM TARTRATE 5 MG PO TABS: 5.0000 mg | ORAL_TABLET | Freq: Once | ORAL | Status: DC

## 2018-08-11 MED ORDER — ATORVASTATIN CALCIUM 10 MG PO TABS
20.0000 mg | ORAL_TABLET | Freq: Every day | ORAL | Status: DC
  Administered 2018-08-12: 20 mg via ORAL
  Filled 2018-08-11: qty 2

## 2018-08-11 MED ORDER — FOLIC ACID 1 MG PO TABS
1.0000 mg | ORAL_TABLET | Freq: Every day | ORAL | Status: DC
  Administered 2018-08-12: 1 mg via ORAL
  Filled 2018-08-11: qty 1

## 2018-08-11 MED ORDER — TRAZODONE HCL 50 MG PO TABS
50.0000 mg | ORAL_TABLET | Freq: Every day | ORAL | Status: DC
  Administered 2018-08-11: 50 mg via ORAL
  Filled 2018-08-11: qty 1

## 2018-08-11 MED ORDER — POTASSIUM CHLORIDE CRYS ER 20 MEQ PO TBCR
40.0000 meq | EXTENDED_RELEASE_TABLET | Freq: Once | ORAL | Status: AC
  Administered 2018-08-11: 40 meq via ORAL
  Filled 2018-08-11: qty 2

## 2018-08-11 MED ORDER — ACETAMINOPHEN 325 MG PO TABS: 650.0000 mg | ORAL_TABLET | Freq: Four times a day (QID) | ORAL | Status: DC | PRN

## 2018-08-11 MED ORDER — FERROUS SULFATE 325 (65 FE) MG PO TABS
325.0000 mg | ORAL_TABLET | Freq: Every day | ORAL | Status: DC
  Administered 2018-08-12: 325 mg via ORAL
  Filled 2018-08-11: qty 1

## 2018-08-11 MED ORDER — SODIUM CHLORIDE 0.9 % IV BOLUS
500.0000 mL | Freq: Once | INTRAVENOUS | Status: AC
  Administered 2018-08-11: 500 mL via INTRAVENOUS

## 2018-08-11 MED ORDER — ESCITALOPRAM OXALATE 10 MG PO TABS
10.0000 mg | ORAL_TABLET | Freq: Every day | ORAL | Status: DC
  Administered 2018-08-12: 10 mg via ORAL
  Filled 2018-08-11: qty 1

## 2018-08-11 MED ORDER — METOPROLOL SUCCINATE ER 100 MG PO TB24
200.0000 mg | ORAL_TABLET | Freq: Every day | ORAL | Status: DC
  Administered 2018-08-12: 200 mg via ORAL
  Filled 2018-08-11: qty 2

## 2018-08-11 NOTE — ED Notes (Signed)
ED TO INPATIENT HANDOFF REPORT  ED Nurse Name and Phone #: Vilinda Blanks RN [phone]  S Name/Age/Gender Darryl Diaz 78 y.o. male Room/Bed: 035C/035C  Code Status   Code Status: Prior  Home/SNF/Other Home Patient oriented to: self, place, time and situation Is this baseline? Yes   Triage Complete: Triage complete  Chief Complaint Bruising  Triage Note Pt noticed large bruising to right arm and flank, takes eliquis for Hx afib. Denies remembering any trauma or falls to these areas. A/O x 4, well appearing.   Allergies Allergies  Allergen Reactions  . Penicillins     Childhood allergy Has patient had a PCN reaction causing immediate rash, facial/tongue/throat swelling, SOB or lightheadedness with hypotension:YES Has patient had a PCN reaction causing severe rash involving mucus membranes or skin necrosis: NO Has patient had a PCN reaction that required hospitalization NO Has patient had a PCN reaction occurring within the last 10 years: NO If all of the above answers are "NO", then may proceed with Cephalosporin use.    Level of Care/Admitting Diagnosis ED Disposition    ED Disposition Condition Comment   Admit  Hospital Area: [facility] [[id number]]  Level of Care: Telemetry Medical [104]  I expect the patient will be discharged within 24 hours: Yes  LOW acuity---Tx typically complete <24 hrs---ACUTE conditions typically can be evaluated <24 hours---LABS likely to return to acceptable levels <24 hours---IS near functional baseline---EXPECTED to return to current living arrangement---NOT newly hypoxic: Meets criteria for 5C-Observation unit  Diagnosis: Anemia [[id number]]  Admitting Physician: Barb Merino [[id number]]  Attending Physician: Barb Merino [[id number]]  PT Class (Do Not Modify): Observation [104]  PT Acc Code (Do Not Modify): Observation [10022]       B Medical/Surgery History Past Medical History:  Diagnosis Date  . 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 Atrial fibrillation ([place])   . Fall at home 03/28/2016  . GERD (gastroesophageal reflux disease)   . H/O seasonal allergies   . Hx of adenomatous colonic polyps 08/10/2017  . Hyperlipidemia   . Hypertension   . Insomnia   . 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 OA (osteoarthritis)    hands   Past Surgical History:  Procedure Laterality Date  . ANKLE SURGERY Left 2014   had rods put in   . COLONOSCOPY     [place]  . HIP ARTHROPLASTY Left 04/01/2016   Procedure: ARTHROPLASTY BIPOLAR HIP (HEMIARTHROPLASTY);  Surgeon: Paralee Cancel, MD;  Location: [facility];  Service: Orthopedics;  Laterality: Left;  . HIP CLOSED REDUCTION Left 04/30/2016   Procedure: CLOSED REDUCTION HIP;  Surgeon: Wylene Simmer, MD;  Location: [facility];  Service: Orthopedics;  Laterality: Left;  . TONSILLECTOMY       A IV Location/Drains/Wounds Patient Lines/Drains/Airways Status   Active Line/Drains/Airways    Name:   Placement date:   Placement time:   Site:   Days:   Peripheral IV 08/11/18 Left;Proximal Antecubital   08/11/18    1200    Antecubital   less than 1   Incision (Closed) 04/01/16 Hip Left   04/01/16    1237     862   Pressure Injury 03/30/16 Deep Tissue Injury - Purple or maroon localized area of discolored intact skin or blood-filled blister due to damage of underlying soft tissue from pressure and/or shear.   03/30/16    -     864  Intake/Output Last 24 hours No intake or output data in the 24 hours ending 08/11/18 1511  Labs/Imaging Results for orders placed or performed during the hospital encounter of 08/11/18 (from the past 48 hour(s))  CBC with Differential     Status: Abnormal   Collection Time: 08/11/18 11:19 AM  Result Value Ref Range   WBC 6.5 4.0 - 10.5 K/uL   RBC 2.60 (L) 4.22 - 5.81 MIL/uL   Hemoglobin 7.9 (L) 13.0 - 17.0 g/dL   HCT 25.1 (L) 39.0 - 52.0 %   MCV 96.5 80.0 - 100.0 fL   MCH 30.4 26.0 - 34.0 pg   MCHC  31.5 30.0 - 36.0 g/dL   RDW 15.7 (H) 11.5 - 15.5 %   Platelets 93 (L) 150 - 400 K/uL    Comment: REPEATED TO VERIFY PLATELET COUNT CONFIRMED BY SMEAR SPECIMEN CHECKED FOR CLOTS Immature Platelet Fraction may be clinically indicated, consider ordering this additional test [id number]    nRBC 0.0 0.0 - 0.2 %   Neutrophils Relative % 73 %   Neutro Abs 4.8 1.7 - 7.7 K/uL   Lymphocytes Relative 16 %   Lymphs Abs 1.1 0.7 - 4.0 K/uL   Monocytes Relative 7 %   Monocytes Absolute 0.5 0.1 - 1.0 K/uL   Eosinophils Relative 2 %   Eosinophils Absolute 0.2 0.0 - 0.5 K/uL   Basophils Relative 1 %   Basophils Absolute 0.0 0.0 - 0.1 K/uL   Immature Granulocytes 1 %   Abs Immature Granulocytes 0.04 0.00 - 0.07 K/uL    Comment: Performed at [facility], [address], [place], [place] [address]  Basic metabolic panel     Status: Abnormal   Collection Time: 08/11/18 11:19 AM  Result Value Ref Range   Sodium 129 (L) 135 - 145 mmol/L   Potassium 3.2 (L) 3.5 - 5.1 mmol/L   Chloride 95 (L) 98 - 111 mmol/L   CO2 23 22 - 32 mmol/L   Glucose, Bld 114 (H) 70 - 99 mg/dL   BUN 22 8 - 23 mg/dL   Creatinine, Ser 1.34 (H) 0.61 - 1.24 mg/dL   Calcium 9.1 8.9 - 10.3 mg/dL   GFR calc non Af Amer 51 (L) >60 mL/min   GFR calc Af Amer 59 (L) >60 mL/min   Anion gap 11 5 - 15    Comment: Performed at [facility] [address] [place], [place] [address]  Protime-INR     Status: Abnormal   Collection Time: 08/11/18 11:19 AM  Result Value Ref Range   Prothrombin Time 17.9 (H) 11.4 - 15.2 seconds   INR 1.5 (H) 0.8 - 1.2    Comment: (NOTE) INR goal varies based on device and disease states. Performed at [facility], [place] [address], [place], [place] [address]   Urinalysis, Routine w reflex microscopic     Status: Abnormal   Collection Time: 08/11/18 11:19 AM  Result Value Ref Range   Color, Urine YELLOW YELLOW   APPearance CLEAR CLEAR   Specific Gravity, Urine 1.005 1.005 - 1.030   pH  6.0 5.0 - 8.0   Glucose, UA NEGATIVE NEGATIVE mg/dL   Hgb urine dipstick SMALL (A) NEGATIVE   Bilirubin Urine NEGATIVE NEGATIVE   Ketones, ur NEGATIVE NEGATIVE mg/dL   Protein, ur NEGATIVE NEGATIVE mg/dL   Nitrite NEGATIVE NEGATIVE   Leukocytes,Ua NEGATIVE NEGATIVE   RBC / HPF 0-5 0 - 5 RBC/hpf   WBC, UA 0-5 0 -  5 WBC/hpf   Bacteria, UA NONE SEEN NONE SEEN    Comment: Performed at [facility] [address], [place], [place] [address]  Troponin I - ONCE - STAT     Status: None   Collection Time: 08/11/18  1:03 PM  Result Value Ref Range   Troponin I <0.03 <0.03 ng/mL    Comment: Performed at [facility], [place] [address], [place], [place] [address]   Dg Chest 2 View  Result Date: 08/11/2018 CLINICAL DATA:  Pt noticed large bruising to right arm (distal humerus) that he noticed earlier today. NKI. Pt also has had some sob and chest tightness for a few days. EXAM: CHEST - 2 VIEW COMPARISON:  03/30/2016 FINDINGS: Cardiac silhouette is top-normal in size. No mediastinal or hilar masses. There is no evidence of adenopathy. There are linear opacities at the lung bases consistent with atelectasis and/or scarring. Lungs are otherwise clear. No pleural effusion or pneumothorax. Skeletal structures are intact. IMPRESSION: No active cardiopulmonary disease. Electronically Signed   By: Lajean Manes M.D.   On: 08/11/2018 11:56   Ct Head Wo Contrast  Result Date: 08/11/2018 CLINICAL DATA:  Possible fall/trauma. Bruising to right arm and flank. Patient on Eliquis. EXAM: CT HEAD WITHOUT CONTRAST TECHNIQUE: Contiguous axial images were obtained from the base of the skull through the vertex without intravenous contrast. COMPARISON:  None. FINDINGS: Brain: Ventricles and cisterns are within normal. There is mild age related atrophic change. There is mild chronic ischemic microvascular disease. There is no mass, mass effect, shift of midline structures or acute hemorrhage. No evidence of acute  infarction. Vascular: No hyperdense vessel or unexpected calcification. Skull: Normal. Negative for fracture or focal lesion. Sinuses/Orbits: No acute finding. Other: None. IMPRESSION: No acute findings. 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 Electronically Signed   By: Marin Olp M.D.   On: 08/11/2018 13:34   Ct Abdomen Pelvis W Contrast  Addendum Date: 08/11/2018   ADDENDUM REPORT: 08/11/2018 14:00 ADDENDUM: Not mentioned above, nondisplaced right posterior ninth and tenth rib fractures. Electronically Signed   By: Kathreen Devoid   On: 08/11/2018 14:00   Result Date: 08/11/2018 CLINICAL DATA:  Abdominal trauma, on blood thinners EXAM: CT ABDOMEN AND PELVIS WITH CONTRAST TECHNIQUE: Multidetector CT imaging of the abdomen and pelvis was performed using the standard protocol following bolus administration of intravenous contrast. CONTRAST:  127mL OMNIPAQUE IOHEXOL 300 MG/ML  SOLN COMPARISON:  None. FINDINGS: Lower chest: Trace right pleural effusion.  No pneumothorax. Hepatobiliary: Diffuse low attenuation of the liver as can be seen with hepatic steatosis. No focal mass. Cholelithiasis. No intrahepatic or extrahepatic biliary ductal dilatation. Pancreas: Unremarkable. No pancreatic ductal dilatation or surrounding inflammatory changes. Spleen: Normal in size without focal abnormality. Adrenals/Urinary Tract: Normal adrenal glands. Normal kidneys. No urolithiasis or obstructive uropathy. Normal bladder. Stomach/Bowel: Stomach is within normal limits. Appendix appears normal. No evidence of bowel wall thickening, distention, or inflammatory changes. Vascular/Lymphatic: Normal caliber abdominal aorta with atherosclerosis. No lymphadenopathy. Reproductive: Prostate is unremarkable. Other: Small fat containing umbilical hernia. No fluid collection or hematoma. Soft tissue contusion in the subcutaneous fat overlying the right iliac crest. Musculoskeletal: No acute osseous  abnormality. No aggressive osseous lesion. 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 Mild degenerative disc disease with disc height loss throughout the thoracolumbar spine with bilateral facet arthropathy and foraminal narrowing. IMPRESSION: 1. No acute intra-abdominal or intrapelvic injury. 2. Soft tissue  contusion overlying the right iliac crest in the subcutaneous fat. 3. Hepatic steatosis. 4.  Aortic Atherosclerosis (ICD10-I70.0). Electronically Signed: By: Kathreen Devoid On: 08/11/2018 13:34   Dg Humerus Right  Result Date: 08/11/2018 CLINICAL DATA:  Pt noticed large bruising to right arm (distal humerus) that he noticed earlier today. NKI. Pt also has had some sob and chest tightness for a few days. EXAM: RIGHT HUMERUS - 2+ VIEW COMPARISON:  None. FINDINGS: No fracture or bone lesion. Shoulder and elbow joints are normally aligned. There is subcutaneous soft tissue prominence over the mid arm which may reflect a hematoma. IMPRESSION: No fracture or dislocation.  No bone lesion. Electronically Signed   By: Lajean Manes M.D.   On: 08/11/2018 11:54    Pending Labs [employer] (From admission, onward)    Start     Ordered   Signed and Held  CBC  Tomorrow morning,   R     Signed and Held   Signed and Held  Basic metabolic panel  Tomorrow morning,   R     Signed and Held          Vitals/Pain Today's Vitals   08/11/18 1115 08/11/18 1117 08/11/18 1345 08/11/18 1400  BP: 136/68  139/70 (!) 151/78  Pulse: 70     Resp:   20 (!) 21  Temp:      TempSrc:      SpO2: 98%     Weight:      Height:      PainSc:  0-No pain      Isolation Precautions No active isolations  Medications Medications  potassium chloride SA (K-DUR,KLOR-CON) CR tablet 40 mEq (has no administration in time range)  sodium chloride 0.9 % bolus 500 mL (500 mLs Intravenous New Bag/Given 08/11/18 1336)  iohexol (OMNIPAQUE) 300 MG/ML  solution 100 mL (100 mLs Intravenous Contrast Given 08/11/18 1257)    Mobility walks Low fall risk   Focused Assessments Musculoskeletal   R Recommendations: See Admitting Provider Note  Report given to:   Additional Notes: Pt here from home, pleasant A/O x 4 and independent cauc male with newly noted bruising covering right arm and right flank.  Takes eliquis for Hx afib.   States he slipped into a chair a week ago but did not notice any bruising until today while in shower.  Feels well otherwise, no complaints. Hgb today showed 7.1, 1 year ago last comparable value at 12. Denies black stool, any other noted blood loss.  CT head WNL, CT abd/pelv WNL, noted right 9th and 10th rib fxs. VSS. 20 g left ac. Plan to obs overnight, recheck cbc in am, stop eliquis for 2 weeks with PCP fu.

## 2018-08-11 NOTE — ED Provider Notes (Addendum)
[facility] EMERGENCY DEPARTMENT Provider Note   CSN: [id number] Arrival date & time: 08/11/18  1106    History   Chief Complaint Chief Complaint  Patient presents with   Bleeding/Bruising    right arm and flank (blood thinners)    HPI Darryl Diaz is a 78 y.o. male.     HPI   78 yo M with PMHx as below here with R arm and flank bruising. Pt is on Eliquis for h/o AFib. He fell 1 week ago onto the back of a chair, hitting his R lower back. He's had mild aching R flank pain since then w/ bruising, and a superficial skin abrasion. Over the past day or so, he's also noticed bruising of his R upper arm though denies any new falls. No head injury, headache, or LOC. No focal numbness or weakness. Flank, but not his arm, is TTP. No alleviating factors. No CP, SOB, or other complaints. No lightheadedness. On Eliquis, but denies h/o spontaneous hematomas. Has not changed dose or accidentally taken too much. No other complaints.  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   Atrial fibrillation ([place])    Fall at home 03/28/2016   GERD (gastroesophageal reflux disease)    H/O seasonal allergies    Hx of adenomatous colonic polyps 08/10/2017   Hyperlipidemia    Hypertension    Insomnia    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   OA (osteoarthritis)    hands    Patient Active Problem List   Diagnosis Date Noted   Hx of adenomatous colonic polyps 08/10/2017   PAF (paroxysmal atrial fibrillation) ([place]) 02/05/2017   Dislocation of hip, posterior, left, closed ([place]) 04/30/2016   Hip fracture ([place]) 03/30/2016   Hypertension 03/30/2016   Hyperlipidemia 03/30/2016   GERD (gastroesophageal reflux disease) 03/30/2016   Osteoarthritis 03/30/2016   Depression 03/30/2016   Rhabdomyolysis 03/30/2016   Leukocytosis 03/30/2016   Pressure injury of skin 03/30/2016   Closed fracture of  left hip [facility])     Past Surgical History:  Procedure Laterality Date   ANKLE SURGERY Left 2014   had rods put in    [place] Left 04/01/2016   Procedure: ARTHROPLASTY BIPOLAR HIP (HEMIARTHROPLASTY);  Surgeon: Paralee Cancel, MD;  Location: [facility];  Service: Orthopedics;  Laterality: Left;   HIP CLOSED REDUCTION Left 04/30/2016   Procedure: CLOSED REDUCTION HIP;  Surgeon: Wylene Simmer, MD;  Location: [facility];  Service: Orthopedics;  Laterality: Left;   TONSILLECTOMY          Home Medications    Prior to Admission medications   Medication Sig Start Date End Date Taking? Authorizing Provider  apixaban (ELIQUIS) 5 MG TABS tablet Take 5 mg by mouth 2 (two) times daily.    [provider]  atorvastatin (LIPITOR) 20 MG tablet Take 20 mg by mouth daily.    [provider]  b complex vitamins capsule Take 1 capsule by mouth daily.    [provider]  escitalopram (LEXAPRO) 10 MG tablet Take 10 mg by mouth daily.    [provider]  ferrous sulfate 325 (65 FE) MG EC tablet Take 325 mg by mouth daily with breakfast.    [provider]  folic acid (FOLVITE) 1 MG tablet Take 1 mg by mouth daily.    [provider]  metoprolol (TOPROL-XL) 200 MG  24 hr tablet Take 200 mg by mouth daily.    [provider]  Multiple Vitamin (MULTIVITAMIN) capsule Take 1 capsule by mouth daily.    [provider]  pantoprazole (PROTONIX) 40 MG tablet Take 40 mg by mouth daily.    [provider]  traZODone (DESYREL) 50 MG tablet Take 50 mg by mouth at bedtime.    [provider]    Family History Family History  Problem Relation Age of Onset   Heart disease Mother 53   Hypertension Mother    Arthritis Mother    Heart failure Father 48   Stroke Maternal Aunt    Heart failure Sister    Colon cancer Neg Hx    Esophageal cancer Neg Hx    Stomach cancer Neg Hx    Rectal  cancer Neg Hx     Social History Social History   Tobacco Use   Smoking status: Former Smoker   Smokeless tobacco: Never Used  Substance Use Topics   Alcohol use: No    Comment: 03/2016   i HAD ONE DRINK & THAT WAS THE FIRST DRINK i HAD IN A LONG TIME    Drug use: No     Allergies   Penicillins   Review of Systems Review of Systems  Constitutional: Negative for chills, fatigue and fever.  HENT: Negative for congestion and rhinorrhea.   Eyes: Negative for visual disturbance.  Respiratory: Negative for cough, shortness of breath and wheezing.   Cardiovascular: Negative for chest pain and leg swelling.  Gastrointestinal: Negative for abdominal pain, diarrhea, nausea and vomiting.  Genitourinary: Negative for dysuria and flank pain.  Musculoskeletal: Negative for neck pain and neck stiffness.  Skin: Positive for rash. Negative for wound.  Allergic/Immunologic: Negative for immunocompromised state.  Neurological: Negative for syncope, weakness and headaches.  Hematological: Bruises/bleeds easily.  All other systems reviewed and are negative.    Physical Exam Updated Vital Signs BP 136/68    Pulse 70    Temp 98.7 F (37.1 C) (Oral)    Resp 18    Ht 6' (1.829 m)    Wt 117 kg    SpO2 98%    BMI 34.98 kg/m   Physical Exam Vitals signs and nursing note reviewed.  Constitutional:      General: He is not in acute distress.    Appearance: He is well-developed.  HENT:     Head: Normocephalic and atraumatic.  Eyes:     Conjunctiva/sclera: Conjunctivae normal.  Neck:     Musculoskeletal: Neck supple.  Cardiovascular:     Rate and Rhythm: Normal rate and regular rhythm.     Heart sounds: Normal heart sounds. No murmur. No friction rub.  Pulmonary:     Effort: Pulmonary effort is normal. No respiratory distress.     Breath sounds: Normal breath sounds. No wheezing or rales.  Abdominal:     General: There is no distension.     Palpations: Abdomen is soft.      Tenderness: There is no abdominal tenderness.  Musculoskeletal:       Arms:     Comments: Superfiical ecchymoses R upper arm, with no palpable hematoma. Large area of bruising to R flank with healing abrasion to R posterolateral flank. No open wounds. No palpable hematoma. No crepitance.  Skin:    General: Skin is warm.     Capillary Refill: Capillary refill takes less than 2 seconds.  Neurological:     Mental Status: He is alert  and oriented to person, place, and time.     Motor: No abnormal muscle tone.      ED Treatments / Results  Labs (all labs ordered are listed, but only abnormal results are displayed) Labs Reviewed  CBC WITH DIFFERENTIAL/PLATELET - Abnormal; Notable for the following components:      Result Value   RBC 2.60 (*)    Hemoglobin 7.9 (*)    HCT 25.1 (*)    RDW 15.7 (*)    Platelets 93 (*)    All other components within normal limits  BASIC METABOLIC PANEL - Abnormal; Notable for the following components:   Sodium 129 (*)    Potassium 3.2 (*)    Chloride 95 (*)    Glucose, Bld 114 (*)    Creatinine, Ser 1.34 (*)    GFR calc non Af Amer 51 (*)    GFR calc Af Amer 59 (*)    All other components within normal limits  PROTIME-INR - Abnormal; Notable for the following components:   Prothrombin Time 17.9 (*)    INR 1.5 (*)    All other components within normal limits  URINALYSIS, ROUTINE W REFLEX MICROSCOPIC - Abnormal; Notable for the following components:   Hgb urine dipstick SMALL (*)    All other components within normal limits  TROPONIN I    EKG EKG Interpretation  Date/Time:  Saturday August 11 2018 13:34:54 EDT Ventricular Rate:  66 PR Interval:    QRS Duration: 118 QT Interval:  456 QTC Calculation: 478 R Axis:   -23 Text Interpretation:  Sinus rhythm Multiple premature complexes, vent & supraven Nonspecific intraventricular conduction delay Low voltage, precordial leads No significant change since last tracing Confirmed by Duffy Bruce  [phone]) on 08/11/2018 2:22:26 PM Also confirmed by Duffy Bruce [phone]), editor Philomena Doheny [phone])  on 08/12/2018 7:01:41 AM   Radiology Dg Chest 2 View  Result Date: 08/11/2018 CLINICAL DATA:  Pt noticed large bruising to right arm (distal humerus) that he noticed earlier today. NKI. Pt also has had some sob and chest tightness for a few days. EXAM: CHEST - 2 VIEW COMPARISON:  03/30/2016 FINDINGS: Cardiac silhouette is top-normal in size. No mediastinal or hilar masses. There is no evidence of adenopathy. There are linear opacities at the lung bases consistent with atelectasis and/or scarring. Lungs are otherwise clear. No pleural effusion or pneumothorax. Skeletal structures are intact. IMPRESSION: No active cardiopulmonary disease. Electronically Signed   By: Lajean Manes M.D.   On: 08/11/2018 11:56   Ct Head Wo Contrast  Result Date: 08/11/2018 CLINICAL DATA:  Possible fall/trauma. Bruising to right arm and flank. Patient on Eliquis. EXAM: CT HEAD WITHOUT CONTRAST TECHNIQUE: Contiguous axial images were obtained from the base of the skull through the vertex without intravenous contrast. COMPARISON:  None. FINDINGS: Brain: Ventricles and cisterns are within normal. There is mild age related atrophic change. There is mild chronic ischemic microvascular disease. There is no mass, mass effect, shift of midline structures or acute hemorrhage. No evidence of acute infarction. Vascular: No hyperdense vessel or unexpected calcification. Skull: Normal. Negative for fracture or focal lesion. Sinuses/Orbits: No acute finding. Other: None. IMPRESSION: No acute findings. 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 Electronically Signed   By: Marin Olp M.D.   On: 08/11/2018 13:34   Ct Abdomen Pelvis W Contrast  Addendum Date: 08/11/2018   ADDENDUM REPORT: 08/11/2018 14:00 ADDENDUM: Not mentioned above, nondisplaced right posterior ninth and tenth rib fractures. Electronically  Signed   By: Kathreen Devoid   On: 08/11/2018 14:00   Result Date: 08/11/2018 CLINICAL DATA:  Abdominal trauma, on blood thinners EXAM: CT ABDOMEN AND PELVIS WITH CONTRAST TECHNIQUE: Multidetector CT imaging of the abdomen and pelvis was performed using the standard protocol following bolus administration of intravenous contrast. CONTRAST:  150mL OMNIPAQUE IOHEXOL 300 MG/ML  SOLN COMPARISON:  None. FINDINGS: Lower chest: Trace right pleural effusion.  No pneumothorax. Hepatobiliary: Diffuse low attenuation of the liver as can be seen with hepatic steatosis. No focal mass. Cholelithiasis. No intrahepatic or extrahepatic biliary ductal dilatation. Pancreas: Unremarkable. No pancreatic ductal dilatation or surrounding inflammatory changes. Spleen: Normal in size without focal abnormality. Adrenals/Urinary Tract: Normal adrenal glands. Normal kidneys. No urolithiasis or obstructive uropathy. Normal bladder. Stomach/Bowel: Stomach is within normal limits. Appendix appears normal. No evidence of bowel wall thickening, distention, or inflammatory changes. Vascular/Lymphatic: Normal caliber abdominal aorta with atherosclerosis. No lymphadenopathy. Reproductive: Prostate is unremarkable. Other: Small fat containing umbilical hernia. No fluid collection or hematoma. Soft tissue contusion in the subcutaneous fat overlying the right iliac crest. Musculoskeletal: No acute osseous abnormality. No aggressive osseous lesion. 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 Mild degenerative disc disease with disc height loss throughout the thoracolumbar spine with bilateral facet arthropathy and foraminal narrowing. IMPRESSION: 1. No acute intra-abdominal or intrapelvic injury. 2. Soft tissue contusion overlying the right iliac crest in the subcutaneous fat. 3. Hepatic steatosis. 4.  Aortic Atherosclerosis (ICD10-I70.0). Electronically Signed: By: Kathreen Devoid  On: 08/11/2018 13:34   Dg Humerus Right  Result Date: 08/11/2018 CLINICAL DATA:  Pt noticed large bruising to right arm (distal humerus) that he noticed earlier today. NKI. Pt also has had some sob and chest tightness for a few days. EXAM: RIGHT HUMERUS - 2+ VIEW COMPARISON:  None. FINDINGS: No fracture or bone lesion. Shoulder and elbow joints are normally aligned. There is subcutaneous soft tissue prominence over the mid arm which may reflect a hematoma. IMPRESSION: No fracture or dislocation.  No bone lesion. Electronically Signed   By: Lajean Manes M.D.   On: 08/11/2018 11:54    Procedures Procedures (including critical care time)  Medications Ordered in ED Medications  potassium chloride SA (K-DUR,KLOR-CON) CR tablet 40 mEq (has no administration in time range)  sodium chloride 0.9 % bolus 500 mL (500 mLs Intravenous New Bag/Given 08/11/18 1336)  iohexol (OMNIPAQUE) 300 MG/ML solution 100 mL (100 mLs Intravenous Contrast Given 08/11/18 1257)     Initial Impression / Assessment and Plan / ED Course  I have reviewed the triage vital signs and the nursing notes.  Pertinent labs & imaging results that were available during my care of the patient were reviewed by me and considered in my medical decision making (see chart for details).  Clinical Course as of Aug 11 1418  Sat Aug 11, 2018  1316 78 yo M here with R flank and UE bruising on Eliquis. He is AF, HDS here but labs concerning for mild AKI, INR 1.5, and Hgb 7.9 (baseline >12). Pt also w/ mild thrombocytoipenia. H/o alcohol use, ? Cirrhosis. CT, XR ordered.   [CI]  1317 CT c/f R lower rib fx, chest and abd wall hematomas.on my prelim read.   [CI]    Clinical Course User Index [CI] Duffy Bruce, MD       CT scan confirms above. No PTX. Small effusion but satting well on RA. Pt does endorse +SOB, DOE. 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 Will  likely need obs.  Final Clinical Impressions(s) / ED Diagnoses   Final diagnoses:  Closed fracture of multiple ribs of right side, initial encounter  Abdominal wall hematoma, initial encounter  Blood loss anemia    ED Discharge Orders    None       Duffy Bruce, MD 08/11/18 1420    Duffy Bruce, MD 09/10/18 [phone]

## 2018-08-11 NOTE — H&P (Signed)
History and Physical    Darryl Diaz PHX:[id number] DOB: 10-30-1940 DOA: 08/11/2018  PCP: [name], MD  Patient coming from: home   I have personally briefly reviewed patient's old medical records available.   Chief Complaint: arm and flank bruising   HPI: Darryl Diaz is a 78 y.o. male with medical history significant of paroxysmal A. 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  Patient is on Eliquis for history of A. fib.  He fell after tripping about a week ago into the back of a chair hitting his right lower back.  He is having mild pain on his right flank since then with bruising.  Since yesterday, he also noticed bruising on his right upper arm without any injury. Denies any nausea vomiting, hemoptysis hematemesis.  Denies any black tarry stool or hematochezia.  Denies any dizziness lightheadedness or orthostatic changes.  No focal numbness or weakness.  No chest pain or shortness of breath.  No lightheadedness. ED Course: Patient is hemodynamically stable.  He does have muscle hematomas but no active bleeding. Hemoglobin last known 12 more than a year ago-7.9 today.  INR is 1.5.  Platelets are 93.  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 Due to significant drop in hemoglobin, however no recent lab test, suggested observation monitoring.  Review of Systems: As per HPI otherwise 10 point review of systems negative.    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 He has never used smokeless tobacco. He reports that he does not drink alcohol or use drugs.  Allergies  Allergen Reactions   Penicillins     Childhood allergy Has patient had a PCN reaction causing immediate rash, facial/tongue/throat swelling, SOB or lightheadedness with hypotension:YES Has patient had a PCN reaction causing severe rash involving mucus membranes or skin necrosis: NO Has patient had a PCN reaction that required hospitalization NO Has patient had a PCN reaction occurring within the last 10 years: NO If all of the above answers are "NO", then may proceed with Cephalosporin use.    Family History  Problem Relation Age of Onset   Heart disease Mother 41   Hypertension Mother    Arthritis Mother    Heart failure Father 5   Stroke Maternal Aunt    Heart failure Sister    Colon  cancer Neg Hx    Esophageal cancer Neg Hx    Stomach cancer Neg Hx    Rectal cancer Neg Hx      Prior to Admission medications   Medication Sig Start Date End Date Taking? Authorizing Provider  apixaban (ELIQUIS) 5 MG TABS tablet Take 5 mg by mouth 2 (two) times daily.    [provider]  atorvastatin (LIPITOR) 20 MG tablet Take 20 mg by mouth daily.    [provider]  b complex vitamins capsule Take 1 capsule by mouth daily.    [provider]  escitalopram (LEXAPRO) 10 MG  tablet Take 10 mg by mouth daily.    [provider]  ferrous sulfate 325 (65 FE) MG EC tablet Take 325 mg by mouth daily with breakfast.    [provider]  folic acid (FOLVITE) 1 MG tablet Take 1 mg by mouth daily.    [provider]  metoprolol (TOPROL-XL) 200 MG 24 hr tablet Take 200 mg by mouth daily.    [provider]  Multiple Vitamin (MULTIVITAMIN) capsule Take 1 capsule by mouth daily.    [provider]  pantoprazole (PROTONIX) 40 MG tablet Take 40 mg by mouth daily.    [provider]  traZODone (DESYREL) 50 MG tablet Take 50 mg by mouth at bedtime.    [provider]    Physical Exam: Vitals:   08/11/18 1108 08/11/18 1109 08/11/18 1115  BP: (!) 139/59  136/68  Pulse: 72  70  Resp: 18    Temp: 98.7 F (37.1 C)    TempSrc: Oral    SpO2: 100%  98%  Weight:  117 kg   Height:  6' (1.829 m)     Constitutional: NAD, calm, comfortable Vitals:   08/11/18 1108 08/11/18 1109 08/11/18 1115  BP: (!) 139/59  136/68  Pulse: 72  70  Resp: 18    Temp: 98.7 F (37.1 C)    TempSrc: Oral    SpO2: 100%  98%  Weight:  117 kg   Height:  6' (1.829 m)    Eyes: PERRL, lids and conjunctivae normal ENMT: Mucous membranes are moist. Posterior pharynx clear of any exudate or lesions.Normal dentition.  Neck: normal, supple, no masses, no thyromegaly Respiratory: clear to auscultation bilaterally, no wheezing, no crackles. Normal respiratory effort. No accessory muscle use.  Cardiovascular: Regular rate and rhythm, no murmurs / rubs / gallops. No extremity edema. 2+ pedal pulses. No carotid bruits.  Abdomen: no tenderness, no masses palpated. No hepatosplenomegaly. Bowel sounds positive.  Musculoskeletal: no clubbing / cyanosis. No joint deformity upper and lower extremities. Good ROM, no contractures. Normal muscle tone.  Skin: no rashes, lesions, ulcers. No induration Neurologic: CN 2-12 grossly intact. Sensation intact, DTR  normal. Strength 5/5 in all 4.  Psychiatric: Normal judgment and insight. Alert and oriented x 3. Normal mood.   Patient has a small ecchymosis on his right arm with no underlying collection. Right flank, large area of bruising with healing abrasion, no open wounds, no palpable hematoma, no underlying crepitation or fluctuation.  Labs on Admission: I have personally reviewed following labs and imaging studies  CBC: Recent Labs  Lab 08/11/18 1119  WBC 6.5  NEUTROABS 4.8  HGB 7.9*  HCT 25.1*  MCV 96.5  PLT 93*   Basic Metabolic Panel: Recent Labs  Lab 08/11/18 1119  NA 129*  K 3.2*  CL 95*  CO2 23  GLUCOSE 114*  BUN 22  CREATININE  1.34*  CALCIUM 9.1   GFR: Estimated Creatinine Clearance: 62 mL/min (A) (by C-G formula based on SCr of 1.34 mg/dL (H)). Liver Function Tests: No results for input(s): AST, ALT, ALKPHOS, BILITOT, PROT, ALBUMIN in the last 168 hours. No results for input(s): LIPASE, AMYLASE in the last 168 hours. No results for input(s): AMMONIA in the last 168 hours. Coagulation Profile: Recent Labs  Lab 08/11/18 1119  INR 1.5*   Cardiac Enzymes: Recent Labs  Lab 08/11/18 1303  TROPONINI <0.03   BNP (last 3 results) No results for input(s): PROBNP in the last 8760 hours. HbA1C: No results for input(s): HGBA1C in the last 72 hours. CBG: No results for input(s): GLUCAP in the last 168 hours. Lipid Profile: No results for input(s): CHOL, HDL, LDLCALC, TRIG, CHOLHDL, LDLDIRECT in the last 72 hours. Thyroid Function Tests: No results for input(s): TSH, T4TOTAL, FREET4, T3FREE, THYROIDAB in the last 72 hours. Anemia Panel: No results for input(s): VITAMINB12, FOLATE, FERRITIN, TIBC, IRON, RETICCTPCT in the last 72 hours. Urine analysis:    Component Value Date/Time   COLORURINE YELLOW 08/11/2018 1119   APPEARANCEUR CLEAR 08/11/2018 1119   LABSPEC 1.005 08/11/2018 1119   PHURINE 6.0 08/11/2018 1119   GLUCOSEU NEGATIVE 08/11/2018 1119   HGBUR  SMALL (A) 08/11/2018 1119   BILIRUBINUR NEGATIVE 08/11/2018 1119   KETONESUR NEGATIVE 08/11/2018 1119   PROTEINUR NEGATIVE 08/11/2018 1119   NITRITE NEGATIVE 08/11/2018 1119   LEUKOCYTESUR NEGATIVE 08/11/2018 1119    Radiological Exams on Admission: Dg Chest 2 View  Result Date: 08/11/2018 CLINICAL DATA:  Pt noticed large bruising to right arm (distal humerus) that he noticed earlier today. NKI. Pt also has had some sob and chest tightness for a few days. EXAM: CHEST - 2 VIEW COMPARISON:  03/30/2016 FINDINGS: Cardiac silhouette is top-normal in size. No mediastinal or hilar masses. There is no evidence of adenopathy. There are linear opacities at the lung bases consistent with atelectasis and/or scarring. Lungs are otherwise clear. No pleural effusion or pneumothorax. Skeletal structures are intact. IMPRESSION: No active cardiopulmonary disease. Electronically Signed   By: Lajean Manes M.D.   On: 08/11/2018 11:56   Ct Head Wo Contrast  Result Date: 08/11/2018 CLINICAL DATA:  Possible fall/trauma. Bruising to right arm and flank. Patient on Eliquis. EXAM: CT HEAD WITHOUT CONTRAST TECHNIQUE: Contiguous axial images were obtained from the base of the skull through the vertex without intravenous contrast. COMPARISON:  None. FINDINGS: Brain: Ventricles and cisterns are within normal. There is mild age related atrophic change. There is mild chronic ischemic microvascular disease. There is no mass, mass effect, shift of midline structures or acute hemorrhage. No evidence of acute infarction. Vascular: No hyperdense vessel or unexpected calcification. Skull: Normal. Negative for fracture or focal lesion. Sinuses/Orbits: No acute finding. Other: None. IMPRESSION: No acute findings. 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 Electronically Signed   By: Marin Olp M.D.   On: 08/11/2018 13:34   Ct Abdomen Pelvis W Contrast  Addendum Date: 08/11/2018   ADDENDUM REPORT:  08/11/2018 14:00 ADDENDUM: Not mentioned above, nondisplaced right posterior ninth and tenth rib fractures. Electronically Signed   By: Kathreen Devoid   On: 08/11/2018 14:00   Result Date: 08/11/2018 CLINICAL DATA:  Abdominal trauma, on blood thinners EXAM: CT ABDOMEN AND PELVIS WITH CONTRAST TECHNIQUE: Multidetector CT imaging of the abdomen and pelvis was performed using the standard protocol following bolus administration of intravenous contrast. CONTRAST:  157mL OMNIPAQUE IOHEXOL 300 MG/ML  SOLN  COMPARISON:  None. FINDINGS: Lower chest: Trace right pleural effusion.  No pneumothorax. Hepatobiliary: Diffuse low attenuation of the liver as can be seen with hepatic steatosis. No focal mass. Cholelithiasis. No intrahepatic or extrahepatic biliary ductal dilatation. Pancreas: Unremarkable. No pancreatic ductal dilatation or surrounding inflammatory changes. Spleen: Normal in size without focal abnormality. Adrenals/Urinary Tract: Normal adrenal glands. Normal kidneys. No urolithiasis or obstructive uropathy. Normal bladder. Stomach/Bowel: Stomach is within normal limits. Appendix appears normal. No evidence of bowel wall thickening, distention, or inflammatory changes. Vascular/Lymphatic: Normal caliber abdominal aorta with atherosclerosis. No lymphadenopathy. Reproductive: Prostate is unremarkable. Other: Small fat containing umbilical hernia. No fluid collection or hematoma. Soft tissue contusion in the subcutaneous fat overlying the right iliac crest. Musculoskeletal: No acute osseous abnormality. No aggressive osseous lesion. 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 Mild degenerative disc disease with disc height loss throughout the thoracolumbar spine with bilateral facet arthropathy and foraminal narrowing. IMPRESSION: 1. No acute intra-abdominal or intrapelvic injury. 2. Soft tissue contusion overlying the right iliac crest in  the subcutaneous fat. 3. Hepatic steatosis. 4.  Aortic Atherosclerosis (ICD10-I70.0). Electronically Signed: By: Kathreen Devoid On: 08/11/2018 13:34   Dg Humerus Right  Result Date: 08/11/2018 CLINICAL DATA:  Pt noticed large bruising to right arm (distal humerus) that he noticed earlier today. NKI. Pt also has had some sob and chest tightness for a few days. EXAM: RIGHT HUMERUS - 2+ VIEW COMPARISON:  None. FINDINGS: No fracture or bone lesion. Shoulder and elbow joints are normally aligned. There is subcutaneous soft tissue prominence over the mid arm which may reflect a hematoma. IMPRESSION: No fracture or dislocation.  No bone lesion. Electronically Signed   By: Lajean Manes M.D.   On: 08/11/2018 11:54    EKG: Independently reviewed.  Normal sinus rhythm.  Multiple PVCs.  Low voltage EKG.  Comparable to previous EKG.  Assessment/Plan Principal Problem:   Acute posthemorrhagic anemia Active Problems:   Hypertension   Hyperlipidemia   GERD (gastroesophageal reflux disease)   Depression   PAF (paroxysmal atrial fibrillation) (HCC)   Anemia     1.  Acute posthemorrhagic anemia.  Probably acute on chronic due to use of anticoagulation: Patient does not have any evidence of active ongoing bleeding.  Hematomas with no complications. Agree with monitoring.  No indication for blood transfusion at this time.  We will recheck levels in the morning to ensure stabilization. Holding Eliquis for 2 weeks to ensure stabilization of hemoglobin. Check orthostatic blood pressures before discharge.  2.  Hypertension: Blood pressures are stable.  Continue metoprolol.  3.  Paroxysmal A. fib: Rate controlled sinus rhythm.  Continue metoprolol.  Will hold Eliquis for at least 2 weeks.  I think he can safely go back on Eliquis in 2 weeks if hemoglobin remains a stable.  His bleedings were traumatic and there is no internal bleeding.  4.  GERD: On PPI continue.  5.  Hyperlipidemia: On statin.   Continue.  6.  Hypokalemia: Replace.  We will recheck levels.    DVT prophylaxis: On Eliquis.  SCDs. Code Status: Full code Family Communication: None Disposition Plan: Home.  Anticipate tomorrow. Consults called: None. Admission status: Observation.   Barb Merino MD [facility] Pager [phone]  If 7PM-7AM, please contact night-coverage [email or web address] Password [email or web address]  08/11/2018, 2:50 PM

## 2018-08-11 NOTE — ED Triage Notes (Signed)
Pt noticed large bruising to right arm and flank, takes eliquis for Hx afib. Denies remembering any trauma or falls to these areas. A/O x 4, well appearing.

## 2018-08-12 DIAGNOSIS — D62 Acute posthemorrhagic anemia: Secondary | ICD-10-CM | POA: Diagnosis not present

## 2018-08-12 LAB — CBC
HCT: 23.8 % — ABNORMAL LOW (ref 39.0–52.0)
Hemoglobin: 8 g/dL — ABNORMAL LOW (ref 13.0–17.0)
MCH: 32.1 pg (ref 26.0–34.0)
MCHC: 33.6 g/dL (ref 30.0–36.0)
MCV: 95.6 fL (ref 80.0–100.0)
Platelets: 94 10*3/uL — ABNORMAL LOW (ref 150–400)
RBC: 2.49 MIL/uL — ABNORMAL LOW (ref 4.22–5.81)
RDW: 15.8 % — ABNORMAL HIGH (ref 11.5–15.5)
WBC: 4.3 10*3/uL (ref 4.0–10.5)
nRBC: 0 % (ref 0.0–0.2)

## 2018-08-12 LAB — BASIC METABOLIC PANEL
Anion gap: 10 (ref 5–15)
BUN: 16 mg/dL (ref 8–23)
CO2: 22 mmol/L (ref 22–32)
Calcium: 8.7 mg/dL — ABNORMAL LOW (ref 8.9–10.3)
Chloride: 101 mmol/L (ref 98–111)
Creatinine, Ser: 1.2 mg/dL (ref 0.61–1.24)
GFR calc Af Amer: >60 mL/min (ref >60)
GFR calc non Af Amer: 58 mL/min — ABNORMAL LOW (ref >60)
Glucose, Bld: 96 mg/dL (ref 70–99)
Potassium: 3.3 mmol/L — ABNORMAL LOW (ref 3.5–5.1)
Sodium: 133 mmol/L — ABNORMAL LOW (ref 135–145)

## 2018-08-12 MED ORDER — APIXABAN 5 MG PO TABS: 5.0000 mg | ORAL_TABLET | Freq: Two times a day (BID) | ORAL | Status: DC

## 2018-08-12 NOTE — Progress Notes (Signed)
OT Cancellation Note  Patient Details Name: Darryl Diaz MRN: [id number] DOB: 12-19-1940   Cancelled Treatment:    Reason Eval/Treat Not Completed: OT screened, no needs identified, will sign off. Per PT, pt near baseline function for ADLs and has all needed DME. Planning for dc later today. Thank you for consult.   [place], OTR/L Acute Rehab Pager: [phone] Office: [phone] 08/12/2018, 9:35 AM

## 2018-08-12 NOTE — Evaluation (Signed)
Physical Therapy Evaluation Patient Details Name: Darryl Diaz MRN: [id number] DOB: 1940/11/07 Today's Date: 08/12/2018   History of Present Illness  Pt is a 78 y/o male who presents s/p fall at home and sustained 9th and 10th R side rib fractures, R arm and R flank bruising. PMH signficant for OA, depression and alcoholism (in remission) following wife's death, HTN, falls, a-fib, L THA and dislocation s/p reduction 1 month later.  Clinical Impression  Pt admitted with above diagnosis. Pt currently with functional limitations due to the deficits listed below (see PT Problem List). At the time of PT eval pt was able to perform transfers and ambulation with gross supervision for safety (occasional min guard assist provided during gait training as pt fatigued). Pt reports he is very near baseline of function, however noted balance and endurance deficits with OOB mobility. Pt will benefit from HHPT services to improve tolerance for functional activity and decrease risk for falls. Recommend pt utilize rollator at this time (he has one) and have family support available at d/c. Acutely, pt will benefit from skilled PT to increase their independence and safety with mobility to allow discharge to the venue listed below.       Follow Up Recommendations Home health PT;Supervision for mobility/OOB    Equipment Recommendations  None recommended by PT    Recommendations for Other Services       Precautions / Restrictions Precautions Precautions: Fall(Technically low fall risk in chart, however history of falls) Restrictions Weight Bearing Restrictions: No      Mobility  Bed Mobility Overal bed mobility: Needs Assistance Bed Mobility: Supine to Sit;Sit to Supine     Supine to sit: Supervision Sit to supine: Supervision   General bed mobility comments: Increased time required. Supervision provided for safety but no assist necessary.   Transfers Overall transfer level: Needs  assistance Equipment used: None Transfers: Sit to/from Stand Sit to Stand: Supervision         General transfer comment: Supervision for safety and management of catheter bag.   Ambulation/Gait Ambulation/Gait assistance: Supervision;Min guard Gait Distance (Feet): 175 Feet Assistive device: None Gait Pattern/deviations: Step-through pattern;Decreased stride length;Wide base of support Gait velocity: Decreased Gait velocity interpretation: 1.31 - 2.62 ft/sec, indicative of limited community ambulator General Gait Details: wide BOS and waddling gait pattern. Pt fatigues quickly and took 1 seated rest break  Stairs            Wheelchair Mobility    Modified Rankin (Stroke Patients Only)       Balance Overall balance assessment: Needs assistance Sitting-balance support: Feet supported;No upper extremity supported Sitting balance-Leahy Scale: Fair Sitting balance - Comments: posterior lean with MMT but did not lose balance   Standing balance support: No upper extremity supported;During functional activity Standing balance-Leahy Scale: Fair                               Pertinent Vitals/Pain Pain Assessment: No/denies pain    Home Living Family/patient expects to be discharged to:: Private residence Living Arrangements: Alone Available Help at Discharge: Family;Available PRN/intermittently Type of Home: House Home Access: Level entry     Home Layout: Two level;Able to live on main level with bedroom/bathroom Home Equipment: Gilford Rile - 2 wheels;Cane - single point;Shower seat;Shower seat - built in;Walker - 4 wheels;Toilet riser(railings on the bed)      Prior Function Level of Independence: Independent  Comments: Has a cleaning service that comes in, townhome takes care of the lawn care. Cooks, drives, does grocery shopping.      Hand Dominance   Dominant Hand: Right    Extremity/Trunk Assessment   Upper Extremity Assessment Upper  Extremity Assessment: Overall WFL for tasks assessed    Lower Extremity Assessment Lower Extremity Assessment: Overall WFL for tasks assessed    Cervical / Trunk Assessment Cervical / Trunk Assessment: Other exceptions Cervical / Trunk Exceptions: forward head posture with rounded shoulders  Communication   Communication: No difficulties  Cognition Arousal/Alertness: Awake/alert Behavior During Therapy: WFL for tasks assessed/performed Overall Cognitive Status: Within Functional Limits for tasks assessed                                        General Comments      Exercises     Assessment/Plan    PT Assessment Patient needs continued PT services  PT Problem List Decreased strength;Decreased activity tolerance;Decreased balance;Decreased mobility;Decreased knowledge of use of DME;Decreased safety awareness;Decreased knowledge of precautions       PT Treatment Interventions DME instruction;Gait training;Functional mobility training;Therapeutic activities;Therapeutic exercise;Neuromuscular re-education;Patient/family education    PT Goals (Current goals can be found in the Care Plan section)  Acute Rehab PT Goals Patient Stated Goal: Home today PT Goal Formulation: With patient Time For Goal Achievement: 08/19/18 Potential to Achieve Goals: Good    Frequency Min 3X/week   Barriers to discharge Decreased caregiver support Lives alone but states he has family that can come assist PRN.     Co-evaluation               AM-PAC PT "6 Clicks" Mobility  Outcome Measure Help needed turning from your back to your side while in a flat bed without using bedrails?: None Help needed moving from lying on your back to sitting on the side of a flat bed without using bedrails?: None Help needed moving to and from a bed to a chair (including a wheelchair)?: None Help needed standing up from a chair using your arms (e.g., wheelchair or bedside chair)?: None Help  needed to walk in hospital room?: A Little Help needed climbing 3-5 steps with a railing? : A Little 6 Click Score: 22    End of Session Equipment Utilized During Treatment: Gait belt Activity Tolerance: Patient limited by fatigue Patient left: in bed;with call bell/phone within reach;with bed alarm set Nurse Communication: Mobility status PT Visit Diagnosis: Unsteadiness on feet (R26.81);History of falling (Z91.81)    Time: 6160-7371 PT Time Calculation (min) (ACUTE ONLY): 22 min   Charges:   PT Evaluation $PT Eval Moderate Complexity: 1 Mod          Darryl Diaz, PT, DPT Acute Rehabilitation Services Pager: [phone] Office: [phone]   Darryl Diaz 08/12/2018, 10:49 AM

## 2018-08-12 NOTE — Progress Notes (Signed)
Nsg Discharge Note  Admit Date:  08/11/2018 Discharge date: 08/12/2018   Jearld Hemp to be D/C'd Home per MD order.  AVS completed.  Copy for chart, and copy for patient signed, and dated. Patient/caregiver able to verbalize understanding.  Discharge Medication: Allergies as of 08/12/2018      Reactions   Penicillins Other (See Comments)   Did it involve swelling of the face/tongue/throat, SOB, or low BP? Unknown Did it involve sudden or severe rash/hives, skin peeling, or any reaction on the inside of your mouth or nose? Unknown Did you need to seek medical attention at a hospital or doctor's office? Unknown When did it last happen?childhood reaction If all above answers are "NO", may proceed with cephalosporin use.      Medication List    TAKE these medications   acetaminophen 500 MG tablet Commonly known as:  TYLENOL Take 1,000 mg by mouth every 6 (six) hours as needed for headache (pain).   apixaban 5 MG Tabs tablet Commonly known as:  Eliquis Take 1 tablet (5 mg total) by mouth 2 (two) times daily. Start taking on:  August 24, 2018 What changed:  These instructions start on August 24, 2018. If you are unsure what to do until then, ask your doctor or other care provider.   atorvastatin 20 MG tablet Commonly known as:  LIPITOR Take 20 mg by mouth daily.   escitalopram 10 MG tablet Commonly known as:  LEXAPRO Take 10 mg by mouth daily.   ferrous sulfate 325 (65 FE) MG EC tablet Take 325 mg by mouth daily.   folic acid 1 MG tablet Commonly known as:  FOLVITE Take 1 mg by mouth daily.   loratadine 10 MG tablet Commonly known as:  CLARITIN Take 10 mg by mouth daily.   metoprolol 200 MG 24 hr tablet Commonly known as:  TOPROL-XL Take 200 mg by mouth daily.   multivitamin with minerals Tabs tablet Take 1 tablet by mouth daily.   pantoprazole 40 MG tablet Commonly known as:  PROTONIX Take 40 mg by mouth daily.   traZODone 50 MG tablet Commonly known as:   DESYREL Take 50 mg by mouth at bedtime.   vitamin B-1 250 MG tablet Take 250 mg by mouth daily.       Discharge Assessment: Vitals:   08/11/18 2114 08/12/18 0502  BP: 133/68 (!) 146/75  Pulse: 77 74  Resp: 18 18  Temp: 98 F (36.7 C) 98.3 F (36.8 C)  SpO2: 99% 98%   Skin clean, dry and intact without evidence of skin break down, no evidence of skin tears noted. IV catheter discontinued intact. Site without signs and symptoms of complications - no redness or edema noted at insertion site, patient denies c/o pain - only slight tenderness at site.  Dressing with slight pressure applied.  D/c Instructions-Education: Discharge instructions given to patient/family with verbalized understanding. D/c education completed with patient/family including follow up instructions, medication list, d/c activities limitations if indicated, with other d/c instructions as indicated by MD - patient able to verbalize understanding, all questions fully answered. Patient instructed to return to ED, call 911, or call MD for any changes in condition.  Patient escorted via [place], and D/C home via private auto.  Tresa Endo, RN 08/12/2018 11:13 AM

## 2018-08-12 NOTE — Discharge Summary (Signed)
Physician Discharge Summary  Darryl Diaz VHQ:[id number] DOB: [birth year] DOA: 08/11/2018  PCP: [name], MD  Admit date: 08/11/2018 Discharge date: 08/12/2018  Admitted From: home Discharge disposition: home   Recommendations for Outpatient Follow-Up:   1. Cbc 1 week 2. Continue incentive spirometry 3. eliquis being held until 4/17    Discharge Diagnosis:   Principal Problem:   Acute posthemorrhagic anemia Active Problems:   Hypertension   Hyperlipidemia   GERD (gastroesophageal reflux disease)   Depression   PAF (paroxysmal atrial fibrillation) ([place])   Anemia    Discharge Condition: Improved.  Diet recommendation: Low sodium, heart healthy  Wound care: None.  Code status: Full.   History of Present Illness:   Darryl Diaz is a 78 y.o. male with medical history significant of paroxysmal A. 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  Patient is on Eliquis for history of A. fib.  He fell after tripping about a week ago into the back of a chair hitting his right lower back.     Hospital Course by Problem:    Acute posthemorrhagic anemia.  Probably acute on chronic due to use of anticoagulation: Patient does not have any evidence of active ongoing bleeding.  Hematomas with no complications. Holding Eliquis for 2 weeks to ensure stabilization of hemoglobin. orthostatic blood pressures negative   Hypertension: Blood pressures are stable.  Continue metoprolol.   Paroxysmal A. fib: Rate controlled sinus rhythm.  Continue metoprolol.  Will hold Eliquis for at least 2 weeks.  I think he can safely go back on Eliquis in 2 weeks if hemoglobin remains a stable.  His bleedings were traumatic and there is no internal bleeding.  GERD: On PPI continue.  Hyperlipidemia: On statin.   Continue.  Hypokalemia: Replaced    Medical Consultants:      Discharge Exam:   Vitals:   08/11/18 2114 08/12/18 0502  BP: 133/68 (!) 146/75  Pulse: 77 74  Resp: 18 18  Temp: 98 F (36.7 C) 98.3 F (36.8 C)  SpO2: 99% 98%   Vitals:   08/11/18 1400 08/11/18 1642 08/11/18 2114 08/12/18 0502  BP: (!) 151/78 133/80 133/68 (!) 146/75  Pulse:  61 77 74  Resp: (!) 21 18 18 18   Temp:  99 F (37.2 C) 98 F (36.7 C) 98.3 F (36.8 C)  TempSrc:   Oral Oral  SpO2:  100% 99% 98%  Weight:      Height:        General exam: Appears calm and comfortable.   The results of significant diagnostics from this hospitalization (including imaging, microbiology, ancillary and laboratory) are listed below for reference.     Procedures and Diagnostic Studies:   Dg Chest 2 View  Result Date: 08/11/2018 CLINICAL DATA:  Pt noticed large bruising to right arm (distal humerus) that he noticed earlier today. NKI. Pt also has had some sob and chest tightness for a few days. EXAM: CHEST - 2 VIEW COMPARISON:  03/30/2016 FINDINGS: Cardiac silhouette is top-normal in size. No mediastinal or hilar masses. There is no evidence of adenopathy. There are linear opacities at the lung bases consistent with atelectasis and/or scarring. Lungs are otherwise clear. No pleural effusion or pneumothorax. Skeletal structures are intact. IMPRESSION: No active cardiopulmonary disease. Electronically Signed   By: Lajean Manes M.D.   On: 08/11/2018  11:56   Ct Head Wo Contrast  Result Date: 08/11/2018 CLINICAL DATA:  Possible fall/trauma. Bruising to right arm and flank. Patient on Eliquis. EXAM: CT HEAD WITHOUT CONTRAST TECHNIQUE: Contiguous axial images were obtained from the base of the skull through the vertex without intravenous contrast. COMPARISON:  None. FINDINGS: Brain: Ventricles and cisterns are within normal. There is mild age related atrophic change. There is mild chronic ischemic microvascular disease. There is  no mass, mass effect, shift of midline structures or acute hemorrhage. No evidence of acute infarction. Vascular: No hyperdense vessel or unexpected calcification. Skull: Normal. Negative for fracture or focal lesion. Sinuses/Orbits: No acute finding. Other: None. IMPRESSION: No acute findings. 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 Electronically Signed   By: Marin Olp M.D.   On: 08/11/2018 13:34   Ct Abdomen Pelvis W Contrast  Addendum Date: 08/11/2018   ADDENDUM REPORT: 08/11/2018 14:00 ADDENDUM: Not mentioned above, nondisplaced right posterior ninth and tenth rib fractures. Electronically Signed   By: Kathreen Devoid   On: 08/11/2018 14:00   Result Date: 08/11/2018 CLINICAL DATA:  Abdominal trauma, on blood thinners EXAM: CT ABDOMEN AND PELVIS WITH CONTRAST TECHNIQUE: Multidetector CT imaging of the abdomen and pelvis was performed using the standard protocol following bolus administration of intravenous contrast. CONTRAST:  11mL OMNIPAQUE IOHEXOL 300 MG/ML  SOLN COMPARISON:  None. FINDINGS: Lower chest: Trace right pleural effusion.  No pneumothorax. Hepatobiliary: Diffuse low attenuation of the liver as can be seen with hepatic steatosis. No focal mass. Cholelithiasis. No intrahepatic or extrahepatic biliary ductal dilatation. Pancreas: Unremarkable. No pancreatic ductal dilatation or surrounding inflammatory changes. Spleen: Normal in size without focal abnormality. Adrenals/Urinary Tract: Normal adrenal glands. Normal kidneys. No urolithiasis or obstructive uropathy. Normal bladder. Stomach/Bowel: Stomach is within normal limits. Appendix appears normal. No evidence of bowel wall thickening, distention, or inflammatory changes. Vascular/Lymphatic: Normal caliber abdominal aorta with atherosclerosis. No lymphadenopathy. Reproductive: Prostate is unremarkable. Other: Small fat containing umbilical hernia. No fluid collection or hematoma. Soft tissue contusion in the  subcutaneous fat overlying the right iliac crest. Musculoskeletal: No acute osseous abnormality. No aggressive osseous lesion. 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 Mild degenerative disc disease with disc height loss throughout the thoracolumbar spine with bilateral facet arthropathy and foraminal narrowing. IMPRESSION: 1. No acute intra-abdominal or intrapelvic injury. 2. Soft tissue contusion overlying the right iliac crest in the subcutaneous fat. 3. Hepatic steatosis. 4.  Aortic Atherosclerosis (ICD10-I70.0). Electronically Signed: By: Kathreen Devoid On: 08/11/2018 13:34   Dg Humerus Right  Result Date: 08/11/2018 CLINICAL DATA:  Pt noticed large bruising to right arm (distal humerus) that he noticed earlier today. NKI. Pt also has had some sob and chest tightness for a few days. EXAM: RIGHT HUMERUS - 2+ VIEW COMPARISON:  None. FINDINGS: No fracture or bone lesion. Shoulder and elbow joints are normally aligned. There is subcutaneous soft tissue prominence over the mid arm which may reflect a hematoma. IMPRESSION: No fracture or dislocation.  No bone lesion. Electronically Signed   By: Lajean Manes M.D.   On: 08/11/2018 11:54     Labs:   Basic Metabolic Panel: Recent Labs  Lab 08/11/18 1119 08/12/18 0544  NA 129* 133*  K 3.2* 3.3*  CL 95* 101  CO2 23 22  GLUCOSE 114* 96  BUN 22 16  CREATININE 1.34* 1.20  CALCIUM 9.1 8.7*   GFR Estimated Creatinine Clearance: 69.2 mL/min (by C-G formula  based on SCr of 1.2 mg/dL). Liver Function Tests: No results for input(s): AST, ALT, ALKPHOS, BILITOT, PROT, ALBUMIN in the last 168 hours. No results for input(s): LIPASE, AMYLASE in the last 168 hours. No results for input(s): AMMONIA in the last 168 hours. Coagulation profile Recent Labs  Lab 08/11/18 1119  INR 1.5*    CBC: Recent Labs  Lab 08/11/18 1119 08/12/18 0544  WBC 6.5 4.3  NEUTROABS 4.8  --     HGB 7.9* 8.0*  HCT 25.1* 23.8*  MCV 96.5 95.6  PLT 93* 94*   Cardiac Enzymes: Recent Labs  Lab 08/11/18 1303  TROPONINI <0.03   BNP: Invalid input(s): POCBNP CBG: No results for input(s): GLUCAP in the last 168 hours. D-Dimer No results for input(s): DDIMER in the last 72 hours. Hgb A1c No results for input(s): HGBA1C in the last 72 hours. Lipid Profile No results for input(s): CHOL, HDL, LDLCALC, TRIG, CHOLHDL, LDLDIRECT in the last 72 hours. Thyroid function studies No results for input(s): TSH, T4TOTAL, T3FREE, THYROIDAB in the last 72 hours.  Invalid input(s): FREET3 Anemia work up No results for input(s): VITAMINB12, FOLATE, FERRITIN, TIBC, IRON, RETICCTPCT in the last 72 hours. Microbiology No results found for this or any previous visit (from the past 240 hour(s)).   Discharge Instructions:   Discharge Instructions    Diet - low sodium heart healthy   Complete by:  As directed    Discharge instructions   Complete by:  As directed    Resume eliquis 4/17 Cbc 1-2 weeks Use incentive spirometry   Increase activity slowly   Complete by:  As directed      Allergies as of 08/12/2018      Reactions   Penicillins Other (See Comments)   Did it involve swelling of the face/tongue/throat, SOB, or low BP? Unknown Did it involve sudden or severe rash/hives, skin peeling, or any reaction on the inside of your mouth or nose? Unknown Did you need to seek medical attention at a hospital or doctor's office? Unknown When did it last happen?childhood reaction If all above answers are NO, may proceed with cephalosporin use.      Medication List    TAKE these medications   acetaminophen 500 MG tablet Commonly known as:  TYLENOL Take 1,000 mg by mouth every 6 (six) hours as needed for headache (pain).   apixaban 5 MG Tabs tablet Commonly known as:  Eliquis Take 1 tablet (5 mg total) by mouth 2 (two) times daily. Start taking on:  August 24, 2018 What changed:   These instructions start on August 24, 2018. If you are unsure what to do until then, ask your doctor or other care provider.   atorvastatin 20 MG tablet Commonly known as:  LIPITOR Take 20 mg by mouth daily.   escitalopram 10 MG tablet Commonly known as:  LEXAPRO Take 10 mg by mouth daily.   ferrous sulfate 325 (65 FE) MG EC tablet Take 325 mg by mouth daily.   folic acid 1 MG tablet Commonly known as:  FOLVITE Take 1 mg by mouth daily.   loratadine 10 MG tablet Commonly known as:  CLARITIN Take 10 mg by mouth daily.   metoprolol 200 MG 24 hr tablet Commonly known as:  TOPROL-XL Take 200 mg by mouth daily.   multivitamin with minerals Tabs tablet Take 1 tablet by mouth daily.   pantoprazole 40 MG tablet Commonly known as:  PROTONIX Take 40 mg by mouth daily.   traZODone 50  MG tablet Commonly known as:  DESYREL Take 50 mg by mouth at bedtime.   vitamin B-1 250 MG tablet Take 250 mg by mouth daily.      Follow-up Information    Tisovec, [name], MD Follow up in 1 week(s).   Specialty:  Internal Medicine Why:  cbc Contact information: [address] [place] [address] [phone]            Time coordinating discharge: 25 min  Signed:  Geradine Girt DO  [facility] 08/12/2018, 8:56 AM

## 2018-08-29 DIAGNOSIS — D500 Iron deficiency anemia secondary to blood loss (chronic): Secondary | ICD-10-CM | POA: Diagnosis not present

## 2018-08-29 DIAGNOSIS — Z7901 Long term (current) use of anticoagulants: Secondary | ICD-10-CM | POA: Diagnosis not present

## 2018-08-29 DIAGNOSIS — I480 Paroxysmal atrial fibrillation: Secondary | ICD-10-CM | POA: Diagnosis not present

## 2018-08-30 DIAGNOSIS — D500 Iron deficiency anemia secondary to blood loss (chronic): Secondary | ICD-10-CM | POA: Diagnosis not present

## 2018-12-19 DIAGNOSIS — N401 Enlarged prostate with lower urinary tract symptoms: Secondary | ICD-10-CM | POA: Insufficient documentation

## 2018-12-27 DIAGNOSIS — Z7901 Long term (current) use of anticoagulants: Secondary | ICD-10-CM | POA: Diagnosis not present

## 2018-12-27 DIAGNOSIS — R7301 Impaired fasting glucose: Secondary | ICD-10-CM | POA: Diagnosis not present

## 2019-02-25 ENCOUNTER — Emergency Department (HOSPITAL_COMMUNITY): Payer: Medicare Other

## 2019-02-25 ENCOUNTER — Encounter (HOSPITAL_COMMUNITY): Payer: Self-pay | Admitting: Emergency Medicine

## 2019-02-25 ENCOUNTER — Other Ambulatory Visit: Payer: Self-pay

## 2019-02-25 ENCOUNTER — Observation Stay (HOSPITAL_COMMUNITY): Payer: Medicare Other

## 2019-02-25 ENCOUNTER — Inpatient Hospital Stay (HOSPITAL_COMMUNITY)
Admission: EM | Admit: 2019-02-25 | Discharge: 2019-03-05 | DRG: 368 | Disposition: A | Discharge status: Skilled Nursing Facility | Payer: Medicare Other | Attending: Student | Admitting: Student

## 2019-02-25 DIAGNOSIS — N400 Enlarged prostate without lower urinary tract symptoms: Secondary | ICD-10-CM | POA: Diagnosis present

## 2019-02-25 DIAGNOSIS — K922 Gastrointestinal hemorrhage, unspecified: Secondary | ICD-10-CM | POA: Diagnosis not present

## 2019-02-25 DIAGNOSIS — W19XXXA Unspecified fall, initial encounter: Secondary | ICD-10-CM | POA: Insufficient documentation

## 2019-02-25 DIAGNOSIS — F1021 Alcohol dependence, in remission: Secondary | ICD-10-CM | POA: Diagnosis present

## 2019-02-25 DIAGNOSIS — K760 Fatty (change of) liver, not elsewhere classified: Secondary | ICD-10-CM | POA: Diagnosis not present

## 2019-02-25 DIAGNOSIS — K2100 Gastro-esophageal reflux disease with esophagitis, without bleeding: Secondary | ICD-10-CM | POA: Diagnosis present

## 2019-02-25 DIAGNOSIS — I480 Paroxysmal atrial fibrillation: Secondary | ICD-10-CM | POA: Diagnosis present

## 2019-02-25 DIAGNOSIS — E785 Hyperlipidemia, unspecified: Secondary | ICD-10-CM | POA: Diagnosis present

## 2019-02-25 DIAGNOSIS — D696 Thrombocytopenia, unspecified: Secondary | ICD-10-CM | POA: Diagnosis present

## 2019-02-25 DIAGNOSIS — R7989 Other specified abnormal findings of blood chemistry: Secondary | ICD-10-CM

## 2019-02-25 DIAGNOSIS — Z8261 Family history of arthritis: Secondary | ICD-10-CM

## 2019-02-25 DIAGNOSIS — G4700 Insomnia, unspecified: Secondary | ICD-10-CM | POA: Diagnosis present

## 2019-02-25 DIAGNOSIS — M19041 Primary osteoarthritis, right hand: Secondary | ICD-10-CM | POA: Diagnosis present

## 2019-02-25 DIAGNOSIS — R0600 Dyspnea, unspecified: Secondary | ICD-10-CM | POA: Diagnosis not present

## 2019-02-25 DIAGNOSIS — K2971 Gastritis, unspecified, with bleeding: Secondary | ICD-10-CM | POA: Diagnosis present

## 2019-02-25 DIAGNOSIS — K226 Gastro-esophageal laceration-hemorrhage syndrome: Secondary | ICD-10-CM | POA: Diagnosis not present

## 2019-02-25 DIAGNOSIS — Y92009 Unspecified place in unspecified non-institutional (private) residence as the place of occurrence of the external cause: Secondary | ICD-10-CM

## 2019-02-25 DIAGNOSIS — Z87891 Personal history of nicotine dependence: Secondary | ICD-10-CM

## 2019-02-25 DIAGNOSIS — E7800 Pure hypercholesterolemia, unspecified: Secondary | ICD-10-CM | POA: Diagnosis not present

## 2019-02-25 DIAGNOSIS — D500 Iron deficiency anemia secondary to blood loss (chronic): Secondary | ICD-10-CM | POA: Diagnosis not present

## 2019-02-25 DIAGNOSIS — R296 Repeated falls: Secondary | ICD-10-CM | POA: Diagnosis present

## 2019-02-25 DIAGNOSIS — M79642 Pain in left hand: Secondary | ICD-10-CM | POA: Diagnosis not present

## 2019-02-25 DIAGNOSIS — Z79899 Other long term (current) drug therapy: Secondary | ICD-10-CM

## 2019-02-25 DIAGNOSIS — R945 Abnormal results of liver function studies: Secondary | ICD-10-CM | POA: Diagnosis not present

## 2019-02-25 DIAGNOSIS — R0602 Shortness of breath: Secondary | ICD-10-CM

## 2019-02-25 DIAGNOSIS — Z7901 Long term (current) use of anticoagulants: Secondary | ICD-10-CM | POA: Diagnosis not present

## 2019-02-25 DIAGNOSIS — N183 Chronic kidney disease, stage 3 unspecified: Secondary | ICD-10-CM | POA: Diagnosis present

## 2019-02-25 DIAGNOSIS — R739 Hyperglycemia, unspecified: Secondary | ICD-10-CM | POA: Diagnosis not present

## 2019-02-25 DIAGNOSIS — R600 Localized edema: Secondary | ICD-10-CM

## 2019-02-25 DIAGNOSIS — R2681 Unsteadiness on feet: Secondary | ICD-10-CM | POA: Diagnosis present

## 2019-02-25 DIAGNOSIS — I130 Hypertensive heart and chronic kidney disease with heart failure and stage 1 through stage 4 chronic kidney disease, or unspecified chronic kidney disease: Secondary | ICD-10-CM | POA: Diagnosis present

## 2019-02-25 DIAGNOSIS — D649 Anemia, unspecified: Secondary | ICD-10-CM

## 2019-02-25 DIAGNOSIS — Z880 Allergy status to penicillin: Secondary | ICD-10-CM

## 2019-02-25 DIAGNOSIS — I5033 Acute on chronic diastolic (congestive) heart failure: Secondary | ICD-10-CM | POA: Diagnosis present

## 2019-02-25 DIAGNOSIS — R040 Epistaxis: Secondary | ICD-10-CM | POA: Diagnosis present

## 2019-02-25 DIAGNOSIS — Z86010 Personal history of colonic polyps: Secondary | ICD-10-CM

## 2019-02-25 DIAGNOSIS — Z20828 Contact with and (suspected) exposure to other viral communicable diseases: Secondary | ICD-10-CM | POA: Diagnosis present

## 2019-02-25 DIAGNOSIS — F1020 Alcohol dependence, uncomplicated: Secondary | ICD-10-CM | POA: Diagnosis present

## 2019-02-25 DIAGNOSIS — M25571 Pain in right ankle and joints of right foot: Secondary | ICD-10-CM | POA: Diagnosis not present

## 2019-02-25 DIAGNOSIS — F329 Major depressive disorder, single episode, unspecified: Secondary | ICD-10-CM | POA: Diagnosis present

## 2019-02-25 DIAGNOSIS — M19042 Primary osteoarthritis, left hand: Secondary | ICD-10-CM | POA: Diagnosis present

## 2019-02-25 DIAGNOSIS — I4891 Unspecified atrial fibrillation: Secondary | ICD-10-CM

## 2019-02-25 DIAGNOSIS — Z8249 Family history of ischemic heart disease and other diseases of the circulatory system: Secondary | ICD-10-CM

## 2019-02-25 DIAGNOSIS — R7401 Elevation of levels of liver transaminase levels: Secondary | ICD-10-CM | POA: Diagnosis present

## 2019-02-25 DIAGNOSIS — G44309 Post-traumatic headache, unspecified, not intractable: Secondary | ICD-10-CM | POA: Diagnosis not present

## 2019-02-25 DIAGNOSIS — E876 Hypokalemia: Secondary | ICD-10-CM | POA: Diagnosis present

## 2019-02-25 DIAGNOSIS — D62 Acute posthemorrhagic anemia: Secondary | ICD-10-CM | POA: Diagnosis present

## 2019-02-25 DIAGNOSIS — E871 Hypo-osmolality and hyponatremia: Secondary | ICD-10-CM | POA: Diagnosis present

## 2019-02-25 DIAGNOSIS — K8020 Calculus of gallbladder without cholecystitis without obstruction: Secondary | ICD-10-CM | POA: Diagnosis not present

## 2019-02-25 LAB — CBC WITH DIFFERENTIAL/PLATELET
Abs Immature Granulocytes: 0.06 10*3/uL (ref 0.00–0.07)
Basophils Absolute: 0 10*3/uL (ref 0.0–0.1)
Basophils Relative: 0 %
Eosinophils Absolute: 0.1 10*3/uL (ref 0.0–0.5)
Eosinophils Relative: 1 %
HCT: 19.7 % — ABNORMAL LOW (ref 39.0–52.0)
Hemoglobin: 6.9 g/dL — CL (ref 13.0–17.0)
Immature Granulocytes: 1 %
Lymphocytes Relative: 10 %
Lymphs Abs: 0.8 10*3/uL (ref 0.7–4.0)
MCH: 34.2 pg — ABNORMAL HIGH (ref 26.0–34.0)
MCHC: 35 g/dL (ref 30.0–36.0)
MCV: 97.5 fL (ref 80.0–100.0)
Monocytes Absolute: 1.2 10*3/uL — ABNORMAL HIGH (ref 0.1–1.0)
Monocytes Relative: 15 %
Neutro Abs: 5.9 10*3/uL (ref 1.7–7.7)
Neutrophils Relative %: 73 %
Platelets: 117 10*3/uL — ABNORMAL LOW (ref 150–400)
RBC: 2.02 MIL/uL — ABNORMAL LOW (ref 4.22–5.81)
RDW: 15.5 % (ref 11.5–15.5)
WBC: 8 10*3/uL (ref 4.0–10.5)
nRBC: 0.3 % — ABNORMAL HIGH (ref 0.0–0.2)

## 2019-02-25 LAB — COMPREHENSIVE METABOLIC PANEL
ALT: 67 U/L — ABNORMAL HIGH (ref 0–44)
AST: 112 U/L — ABNORMAL HIGH (ref 15–41)
Albumin: 3.4 g/dL — ABNORMAL LOW (ref 3.5–5.0)
Alkaline Phosphatase: 120 U/L (ref 38–126)
Anion gap: 13 (ref 5–15)
BUN: 10 mg/dL (ref 8–23)
CO2: 22 mmol/L (ref 22–32)
Calcium: 8.6 mg/dL — ABNORMAL LOW (ref 8.9–10.3)
Chloride: 89 mmol/L — ABNORMAL LOW (ref 98–111)
Creatinine, Ser: 1.35 mg/dL — ABNORMAL HIGH (ref 0.61–1.24)
GFR calc Af Amer: 58 mL/min — ABNORMAL LOW (ref >60)
GFR calc non Af Amer: 50 mL/min — ABNORMAL LOW (ref >60)
Glucose, Bld: 120 mg/dL — ABNORMAL HIGH (ref 70–99)
Potassium: 3.4 mmol/L — ABNORMAL LOW (ref 3.5–5.1)
Sodium: 124 mmol/L — ABNORMAL LOW (ref 135–145)
Total Bilirubin: 2.1 mg/dL — ABNORMAL HIGH (ref 0.3–1.2)
Total Protein: 5.5 g/dL — ABNORMAL LOW (ref 6.5–8.1)

## 2019-02-25 LAB — POC OCCULT BLOOD, ED: Fecal Occult Bld: POSITIVE — AB

## 2019-02-25 LAB — PREPARE RBC (CROSSMATCH)

## 2019-02-25 LAB — BRAIN NATRIURETIC PEPTIDE: B Natriuretic Peptide: 209 pg/mL — ABNORMAL HIGH (ref 0.0–100.0)

## 2019-02-25 MED ORDER — ESCITALOPRAM OXALATE 10 MG PO TABS
10.0000 mg | ORAL_TABLET | Freq: Every day | ORAL | Status: DC
  Administered 2019-02-26 – 2019-03-05 (×8): 10 mg via ORAL
  Filled 2019-02-25 (×8): qty 1

## 2019-02-25 MED ORDER — FUROSEMIDE 10 MG/ML IJ SOLN
20.0000 mg | Freq: Two times a day (BID) | INTRAMUSCULAR | Status: DC
  Administered 2019-02-26 – 2019-03-02 (×10): 20 mg via INTRAVENOUS
  Filled 2019-02-25 (×11): qty 2

## 2019-02-25 MED ORDER — TAMSULOSIN HCL 0.4 MG PO CAPS
0.4000 mg | ORAL_CAPSULE | Freq: Every day | ORAL | Status: DC
  Administered 2019-02-25 – 2019-03-05 (×9): 0.4 mg via ORAL
  Filled 2019-02-25 (×9): qty 1

## 2019-02-25 MED ORDER — POTASSIUM CHLORIDE CRYS ER 20 MEQ PO TBCR
40.0000 meq | EXTENDED_RELEASE_TABLET | Freq: Once | ORAL | Status: AC
  Administered 2019-02-25: 40 meq via ORAL
  Filled 2019-02-25: qty 2

## 2019-02-25 MED ORDER — ACETAMINOPHEN 500 MG PO TABS: 1000.0000 mg | ORAL_TABLET | Freq: Four times a day (QID) | ORAL | Status: DC | PRN

## 2019-02-25 MED ORDER — PANTOPRAZOLE SODIUM 40 MG PO TBEC
40.0000 mg | DELAYED_RELEASE_TABLET | Freq: Every day | ORAL | Status: DC
  Administered 2019-02-26 – 2019-02-27 (×2): 40 mg via ORAL
  Filled 2019-02-25 (×2): qty 1

## 2019-02-25 MED ORDER — TRAZODONE HCL 50 MG PO TABS
50.0000 mg | ORAL_TABLET | Freq: Every day | ORAL | Status: DC
  Administered 2019-02-25 – 2019-03-04 (×8): 50 mg via ORAL
  Filled 2019-02-25 (×8): qty 1

## 2019-02-25 MED ORDER — ATORVASTATIN CALCIUM 10 MG PO TABS
20.0000 mg | ORAL_TABLET | Freq: Every day | ORAL | Status: DC
  Administered 2019-02-26 – 2019-03-05 (×8): 20 mg via ORAL
  Filled 2019-02-25 (×8): qty 2

## 2019-02-25 MED ORDER — VITAMIN B-1 100 MG PO TABS
250.0000 mg | ORAL_TABLET | Freq: Every day | ORAL | Status: DC
  Administered 2019-02-26 – 2019-03-05 (×8): 250 mg via ORAL
  Filled 2019-02-25 (×8): qty 3

## 2019-02-25 MED ORDER — FOLIC ACID 1 MG PO TABS
1.0000 mg | ORAL_TABLET | Freq: Every day | ORAL | Status: DC
  Administered 2019-02-26 – 2019-03-05 (×8): 1 mg via ORAL
  Filled 2019-02-25 (×8): qty 1

## 2019-02-25 MED ORDER — SODIUM CHLORIDE 0.9 % IV SOLN: 10.0000 mL/h | Freq: Once | INTRAVENOUS | Status: AC

## 2019-02-25 MED ORDER — LORATADINE 10 MG PO TABS
10.0000 mg | ORAL_TABLET | Freq: Every day | ORAL | Status: DC
  Administered 2019-02-26 – 2019-03-05 (×8): 10 mg via ORAL
  Filled 2019-02-25 (×8): qty 1

## 2019-02-25 MED ORDER — FERROUS SULFATE 325 (65 FE) MG PO TABS
325.0000 mg | ORAL_TABLET | Freq: Every day | ORAL | Status: DC
  Administered 2019-02-26 – 2019-03-05 (×8): 325 mg via ORAL
  Filled 2019-02-25 (×9): qty 1

## 2019-02-25 MED ORDER — METOPROLOL SUCCINATE ER 100 MG PO TB24
200.0000 mg | ORAL_TABLET | Freq: Every day | ORAL | Status: DC
  Administered 2019-02-26 – 2019-03-05 (×8): 200 mg via ORAL
  Filled 2019-02-25 (×8): qty 2

## 2019-02-25 MED ORDER — FUROSEMIDE 10 MG/ML IJ SOLN
20.0000 mg | Freq: Once | INTRAMUSCULAR | Status: AC
  Administered 2019-02-25: 20 mg via INTRAVENOUS
  Filled 2019-02-25: qty 2

## 2019-02-25 MED ORDER — ADULT MULTIVITAMIN W/MINERALS CH
1.0000 | ORAL_TABLET | Freq: Every day | ORAL | Status: DC
  Administered 2019-02-26 – 2019-03-05 (×8): 1 via ORAL
  Filled 2019-02-25 (×8): qty 1

## 2019-02-25 NOTE — Progress Notes (Signed)
   Patient being admitted for iron-deficiency anemia  Had colonoscopy for iFOBT + stool last year  Is on Eliquis which was last taken this AM - may be able to do an EGD tomorrow but cannot promise that - so would make NPO after 0500  I will alert my colleagues about him and will need to reassess tomorrow re: timing of endoscopic w/u

## 2019-02-25 NOTE — ED Provider Notes (Signed)
[facility] EMERGENCY DEPARTMENT Provider Note   CSN: [id number] Arrival date & time: 02/25/19  1649     History   Chief Complaint Chief Complaint  Patient presents with   Abnormal Lab    HPI Darryl Diaz is a 78 y.o. male past medical history of alcoholism, atrial fibrillation who is currently on Eliquis, GERD, hyperlipidemia, hypertension brought in for evaluation of abnormal hemoglobin.  Patient reports he was being seen by PCP for evaluation of recent falls.  He states that he has been dealing with issues with his balance for several months and feels like it is gotten progressively worsened.  He reports 3 falls in the last several days.  He does state that 1 of when he did hit his head but does not think he had any LOC.  He reports bruising to his left upper extremity since the fall.  He is currently on Eliquis and states he has been compliant with his medications.  His PCP checked blood work today and noted his hemoglobin to be 6 and sent him to the ED.  He has not noted any blood in his stools or black tarry stools.  Patient also endorses some bilateral leg swelling that has been going on for last 10 days as well as some shortness of breath with exertion.  He states that this is new for him.  He states that he does not think he has been in episodes of A. fib recently and can usually tell when he was in it.  He denies any chest pain, abdominal pain, nausea/vomiting, vision changes.     The history is provided by the patient.    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   OA (osteoarthritis)    hands    Patient Active  Problem List   Diagnosis Date Noted   GI bleed 02/25/2019   Acute posthemorrhagic anemia 08/11/2018   Anemia 08/11/2018   Hx of adenomatous colonic polyps 08/10/2017   PAF (paroxysmal atrial fibrillation) ([place]) 02/05/2017   Dislocation of hip, posterior, left, closed ([place]) 04/30/2016   Hip fracture ([place]) 03/30/2016   Hypertension 03/30/2016   Hyperlipidemia 03/30/2016   GERD (gastroesophageal reflux disease) 03/30/2016   Osteoarthritis 03/30/2016   Depression 03/30/2016   Rhabdomyolysis 03/30/2016   Leukocytosis 03/30/2016   Pressure injury of skin 03/30/2016   Closed fracture of left hip [facility])     Past Surgical History:  Procedure Laterality Date   ANKLE SURGERY Left 2014   had rods put in    [place] Left 04/01/2016   Procedure: ARTHROPLASTY BIPOLAR HIP (HEMIARTHROPLASTY);  Surgeon: Paralee Cancel, MD;  Location: [facility];  Service: Orthopedics;  Laterality: Left;   HIP CLOSED REDUCTION Left 04/30/2016   Procedure: CLOSED REDUCTION HIP;  Surgeon: Wylene Simmer, MD;  Location: [facility];  Service: Orthopedics;  Laterality: Left;   TONSILLECTOMY          Home Medications    Prior to Admission medications   Medication Sig Start Date End Date Taking? Authorizing Provider  acetaminophen (TYLENOL) 500 MG  tablet Take 1,000 mg by mouth every 6 (six) hours as needed for headache (pain).   Yes [provider]  apixaban (ELIQUIS) 5 MG TABS tablet Take 1 tablet (5 mg total) by mouth 2 (two) times daily. 08/24/18  Yes Vann, Jessica U, DO  atorvastatin (LIPITOR) 20 MG tablet Take 20 mg by mouth daily.   Yes [provider]  escitalopram (LEXAPRO) 10 MG tablet Take 10 mg by mouth daily.   Yes [provider]  ferrous sulfate 325 (65 FE) MG EC tablet Take 325 mg by mouth daily.    Yes [provider]  folic acid (FOLVITE) 1 MG tablet Take 1 mg by mouth daily.   Yes [provider]  loratadine  (CLARITIN) 10 MG tablet Take 10 mg by mouth daily.   Yes [provider]  metoprolol (TOPROL-XL) 200 MG 24 hr tablet Take 200 mg by mouth daily.   Yes [provider]  Multiple Vitamin (MULTIVITAMIN WITH MINERALS) TABS tablet Take 1 tablet by mouth daily.   Yes [provider]  pantoprazole (PROTONIX) 40 MG tablet Take 40 mg by mouth daily.   Yes [provider]  tamsulosin (FLOMAX) 0.4 MG CAPS capsule Take 0.4 mg by mouth.   Yes [provider]  Thiamine HCl (VITAMIN B-1) 250 MG tablet Take 250 mg by mouth daily.   Yes [provider]  traZODone (DESYREL) 50 MG tablet Take 50 mg by mouth at bedtime.   Yes [provider]    Family History Family History  Problem Relation Age of Onset   Heart disease Mother 81   Hypertension Mother    Arthritis Mother    Heart failure Father 59   Stroke Maternal Aunt    Heart failure Sister    Colon cancer Neg Hx    Esophageal cancer Neg Hx    Stomach cancer Neg Hx    Rectal cancer Neg Hx     Social History Social History   Tobacco Use   Smoking status: Former Smoker   Smokeless tobacco: Never Used  Substance Use Topics   Alcohol use: No    Comment: 03/2016   i HAD ONE DRINK & THAT WAS THE FIRST DRINK i HAD IN A LONG TIME    Drug use: No     Allergies   Penicillins   Review of Systems Review of Systems  Constitutional: Negative for fever.  Respiratory: Positive for shortness of breath. Negative for cough.   Cardiovascular: Positive for leg swelling. Negative for chest pain.  Gastrointestinal: Negative for abdominal pain, nausea and vomiting.  Genitourinary: Negative for dysuria and hematuria.  Musculoskeletal: Positive for gait problem.  Skin: Positive for color change.  Neurological: Negative for weakness, numbness and headaches.  All other systems reviewed and are negative.    Physical Exam Updated Vital Signs BP 135/63    Pulse 77    Temp 98.9 F  (37.2 C) (Oral)    Resp (!) 23    Ht 6' (1.829 m)    Wt 108.9 kg    SpO2 100%    BMI 32.55 kg/m   Physical Exam Vitals signs and nursing note reviewed. Exam conducted with a chaperone present.  Constitutional:      Appearance: Normal appearance. He is well-developed.  HENT:     Head: Normocephalic and atraumatic.  Eyes:     General: Lids are normal.     Conjunctiva/sclera: Conjunctivae normal.     Pupils: Pupils are equal, round,  and reactive to light.  Neck:     Musculoskeletal: Full passive range of motion without pain.  Cardiovascular:     Rate and Rhythm: Normal rate and regular rhythm.     Pulses: Normal pulses.     Heart sounds: Normal heart sounds. No murmur. No friction rub. No gallop.   Pulmonary:     Effort: Pulmonary effort is normal.     Breath sounds: Rales present.     Comments: Rales noted to bilateral bases of lungs.  He is speaking in full sentences without any difficulty.  No evidence of respiratory distress. Abdominal:     Palpations: Abdomen is soft. Abdomen is not rigid.     Tenderness: There is no abdominal tenderness. There is no guarding.     Comments: Abdomen is soft, nondistended.  No tenderness palpation.  No rigidity, guarding.  Genitourinary:    Rectum: Guaiac result negative.     Comments: The exam was performed with a chaperone present. Digital Rectal Exam reveals sphincter with good tone. No external hemorrhoids. No masses or fissures. Stool color is brown with no overt blood. Musculoskeletal: Normal range of motion.     Comments: Full range of motion of left upper extremities any difficulty.  No tenderness palpation to left shoulder, left elbow, left wrist.  No tenderness palpation of bilateral lower extremities.  He has 3+ pitting edema noted from just above the knees that extends distally.  No overlying warmth, erythema.  Skin:    General: Skin is warm and dry.     Capillary Refill: Capillary refill takes less than 2 seconds.     Coloration: Skin  is pale.     Comments: She has ecchymosis that begins on his upper left arm and extends distally all the way to the hand.  Diffuse ecchymosis noted to the left paraspinal region of the thoracic area that extends to the lateral left chest wall.  Scattered ecchymosis and abrasions noted to the bilateral lower extremities.  Neurological:     Mental Status: He is alert and oriented to person, place, and time.  Psychiatric:        Speech: Speech normal.      ED Treatments / Results  Labs (all labs ordered are listed, but only abnormal results are displayed) Labs Reviewed  COMPREHENSIVE METABOLIC PANEL - Abnormal; Notable for the following components:      Result Value   Sodium 124 (*)    Potassium 3.4 (*)    Chloride 89 (*)    Glucose, Bld 120 (*)    Creatinine, Ser 1.35 (*)    Calcium 8.6 (*)    Total Protein 5.5 (*)    Albumin 3.4 (*)    AST 112 (*)    ALT 67 (*)    Total Bilirubin 2.1 (*)    GFR calc non Af Amer 50 (*)    GFR calc Af Amer 58 (*)    All other components within normal limits  CBC WITH DIFFERENTIAL/PLATELET - Abnormal; Notable for the following components:   RBC 2.02 (*)    Hemoglobin 6.9 (*)    HCT 19.7 (*)    MCH 34.2 (*)    Platelets 117 (*)    nRBC 0.3 (*)    Monocytes Absolute 1.2 (*)    All other components within normal limits  BRAIN NATRIURETIC PEPTIDE - Abnormal; Notable for the following components:   B Natriuretic Peptide 209.0 (*)    All other components within normal limits  POC  OCCULT BLOOD, ED - Abnormal; Notable for the following components:   Fecal Occult Bld POSITIVE (*)    All other components within normal limits  SARS CORONAVIRUS 2 (TAT 6-24 HRS)  TYPE AND SCREEN  PREPARE RBC (CROSSMATCH)    EKG None  Radiology Dg Chest 2 View  Result Date: 02/25/2019 CLINICAL DATA:  Shortness of breath. EXAM: CHEST - 2 VIEW COMPARISON:  August 11, 2018. FINDINGS: The heart size and mediastinal contours are within normal limits. Both lungs are  clear. No pneumothorax or pleural effusion is noted. The visualized skeletal structures are unremarkable. IMPRESSION: No active cardiopulmonary disease. Electronically Signed   By: Marijo Conception M.D.   On: 02/25/2019 18:27   Ct Head Wo Contrast  Result Date: 02/25/2019 CLINICAL DATA:  Posttraumatic headache after multiple falls. EXAM: CT HEAD WITHOUT CONTRAST TECHNIQUE: Contiguous axial images were obtained from the base of the skull through the vertex without intravenous contrast. COMPARISON:  August 11, 2018. FINDINGS: Brain: Mild diffuse cortical atrophy is noted. Mild chronic ischemic white matter disease is noted. No mass effect or midline shift is noted. Ventricular size is within normal limits. There is no evidence of mass lesion, hemorrhage or acute infarction. Vascular: No hyperdense vessel or unexpected calcification. Skull: Normal. Negative for fracture or focal lesion. Sinuses/Orbits: No acute finding. Other: None. IMPRESSION: Mild diffuse cortical atrophy. Mild chronic ischemic white matter disease. No acute intracranial abnormality seen. Electronically Signed   By: Marijo Conception M.D.   On: 02/25/2019 18:42    Procedures .Critical Care Performed by: Volanda Napoleon, PA-C Authorized by: Volanda Napoleon, PA-C   Critical care provider statement:    Critical care time (minutes):  35   Critical care was necessary to treat or prevent imminent or life-threatening deterioration of the following conditions: anemia.   Critical care was time spent personally by me on the following activities:  Discussions with consultants, evaluation of patient's response to treatment, examination of patient, ordering and performing treatments and interventions, ordering and review of laboratory studies, ordering and review of radiographic studies, pulse oximetry, re-evaluation of patient's condition, obtaining history from patient or surrogate and review of old charts   (including critical care  time)  Medications Ordered in ED Medications  furosemide (LASIX) injection 20 mg (has no administration in time range)  0.9 %  sodium chloride infusion (has no administration in time range)     Initial Impression / Assessment and Plan / ED Course  I have reviewed the triage vital signs and the nursing notes.  Pertinent labs & imaging results that were available during my care of the patient were reviewed by me and considered in my medical decision making (see chart for details).        78 year old male who presents for evaluation of low hemoglobin.  Reports seeing PCP today for evaluation of frequent falls and noted hemoglobin to be 6.6.  No melena.  He is on Eliquis for A. fib.  Does report that he has hit his head recently but does not think he has had any LOC.  Initially arrival, he is afebrile, nontoxic-appearing, sitting comfortably on bed.  Vital signs are stable.  On my exam, he has diffuse ecchymosis noted to the left upper extremity, left lateral chest wall and back consistent with contusion from falls.  He has scattered ecchymosis noted to legs.  Additionally, he has bilateral lower extremity edema that is 3+ at the knees that extends distally.  He has some crackles  noted at bilateral bases.  His initial O2 sat was stable but when we moved him on the bed and he had to push himself up, he desatted down to the 80s.  He improved up to 94% on room air shortly afterwards.  Concerned that he is anemic from the contusions that he has suffered and has hematomas.  I did not see any melena noted on my digital rectal exam.  Additionally, given history of leg swelling, shortness of breath, concern for CHF exacerbation.  Will plan for labs, chest x-ray, CT head given frequent falls.  Fecal occult is positive.  CMP shows sodium 124, potassium 3.4, chloride of 89.  BUN is 10, creatinine of 1.35.  CBC shows no leukocytosis.  Hemoglobin is 6.9.  Chest x-ray negative for any acute process.  CT head  negative for any acute abnormality.  Question if he is short of breath from anemia versus underlying cardiology process going on.  We will give him 1 unit of red blood cells as well as Lasix to help with fluid overload.  Will require admission.  Discussed patient with Dr. Carlean Purl (GI).  His service will plan to consult on patient.  He request that Eliquis be hold and that patient be made n.p.o.  They will plan to do EGD for evaluation but unsure of when that will take place - either tomorrow or Wednesday.   Discussed patient with Dr. Marlowe Sax (hospitalist). Will plan to admit.   Portions of this note were generated with [employer]. Dictation errors may occur despite best attempts at proofreading.  Final Clinical Impressions(s) / ED Diagnoses   Final diagnoses:  Anemia, unspecified type  Bilateral leg edema  Shortness of breath    ED Discharge Orders    None       Desma Mcgregor 02/25/19 2009    Lucrezia Starch, MD 02/25/19 2352

## 2019-02-25 NOTE — H&P (Signed)
History and Physical    Darryl Diaz QHU:[id number] DOB: Aug 29, 1940 DOA: 02/25/2019  PCP: [name], MD  Chief Complaint: Abnormal lab  HPI: Darryl Diaz is a 78 y.o. male with medical history significant of paroxysmal atrial fibrillation on Eliquis, GERD, history of colon polyps, hypertension, hyperlipidemia, insomnia, depression, osteoarthritis presenting to the ED for evaluation of low hemoglobin, recent falls, and bilateral lower extremity edema.  Patient states he had blood work done by his PCP today and was told his hemoglobin was 6.0.  States his hemoglobin was 11.0 about 8 months ago.  He does take an iron pill at home.  Denies hematemesis, abdominal pain, hematochezia, or melena.  States he lost his balance and had 3 falls last week.  No associated heart palpitations, shortness of breath, chest pain, or dizziness/lightheadedness.  States he did not hit his head.  Denies neck pain.  Denies any other injuries from his falls except bruising of his arms and legs.  Patient reports having worsening bilateral lower extremity edema for the past 1 month.  He is also having dyspnea on exertion for the past few months.  Denies orthopnea.  Denies history of congestive heart failure.  ED Course: Not hypotensive or tachycardic.  Oxygen saturation in the 80s on room air when patient moved in the bed and had to push himself up but then improved to 94% on room air shortly afterwards.  FOBT positive.  No melena seen on digital rectal exam done in the ED.  Hemoglobin 6.9, was 8.0 in April 2020.  Platelet count 117,000, previously low in April 2020 as well.  Sodium 124.  Potassium 3.4.  Creatinine 1.3, no significant change since labs done 6 months ago.  AST 112, ALT 67, T bili 2.1.  Alk phos normal.  LFTs were normal 2 years ago.  Chest x-ray showing no active cardiopulmonary disease.  Head CT showing mild diffuse cortical atrophy, mild chronic ischemic white matter disease, and no acute intracranial  abnormality.  BNP 209. 1 unit PRBCs and IV Lasix 20 mg ordered.  ED provider discussed the case with Dr. Carlean Purl, recommended holding Eliquis and keeping patient NPO after 5 AM.  He may undergo possible EGD tomorrow.  SARS-CoV-2 test pending.  Review of Systems:  All systems reviewed and apart from history of presenting illness, are negative.  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 He has never used smokeless tobacco. He reports that he does not drink alcohol or  use drugs.  Allergies  Allergen Reactions   Penicillins Other (See Comments)    Did it involve swelling of the face/tongue/throat, SOB, or low BP? Unknown Did it involve sudden or severe rash/hives, skin peeling, or any reaction on the inside of your mouth or nose? Unknown Did you need to seek medical attention at a hospital or doctor's office? Unknown When did it last happen?childhood reaction If all above answers are NO, may proceed with cephalosporin use.    Family History  Problem Relation Age of Onset   Heart  disease Mother 24   Hypertension Mother    Arthritis Mother    Heart failure Father 36   Stroke Maternal Aunt    Heart failure Sister    Colon cancer Neg Hx    Esophageal cancer Neg Hx    Stomach cancer Neg Hx    Rectal cancer Neg Hx     Prior to Admission medications   Medication Sig Start Date End Date Taking? Authorizing Provider  acetaminophen (TYLENOL) 500 MG tablet Take 1,000 mg by mouth every 6 (six) hours as needed for headache (pain).   Yes [provider]  apixaban (ELIQUIS) 5 MG TABS tablet Take 1 tablet (5 mg total) by mouth 2 (two) times daily. 08/24/18  Yes Vann, Jessica U, DO  atorvastatin (LIPITOR) 20 MG tablet Take 20 mg by mouth daily.   Yes [provider]  escitalopram (LEXAPRO) 10 MG tablet Take 10 mg by mouth daily.   Yes [provider]  ferrous sulfate 325 (65 FE) MG EC tablet Take 325 mg by mouth daily.    Yes [provider]  folic acid (FOLVITE) 1 MG tablet Take 1 mg by mouth daily.   Yes [provider]  loratadine (CLARITIN) 10 MG tablet Take 10 mg by mouth daily.   Yes [provider]  metoprolol (TOPROL-XL) 200 MG 24 hr tablet Take 200 mg by mouth daily.   Yes [provider]  Multiple Vitamin (MULTIVITAMIN WITH MINERALS) TABS tablet Take 1 tablet by mouth daily.   Yes [provider]  pantoprazole (PROTONIX) 40 MG tablet Take 40 mg by mouth daily.   Yes [provider]  tamsulosin (FLOMAX) 0.4 MG CAPS capsule Take 0.4 mg by mouth.   Yes [provider]  Thiamine HCl (VITAMIN B-1) 250 MG tablet Take 250 mg by mouth daily.   Yes [provider]  traZODone (DESYREL) 50 MG tablet Take 50 mg by mouth at bedtime.   Yes [provider]    Physical Exam: Vitals:   02/25/19 1930 02/25/19 2015 02/25/19 2025 02/25/19 2039  BP:  135/60 135/60 137/64  Pulse: 77 84 77 79  Resp: (!) _0 Temp:  98.3 F (36.8 C) 98.3 F (36.8 C) 98.7 F (37.1  C)  TempSrc:  Oral  Oral  SpO2: 100% 95% 93% 100%  Weight:      Height:        Physical Exam  Constitutional: He is oriented to person, place, and time. He appears well-developed and well-nourished. No distress.  HENT:  Head: Normocephalic.  Mouth/Throat: Oropharynx is clear and moist.  Small amount of dried blood noted at the left nostril, no active bleeding  Eyes: Right eye exhibits no discharge. Left eye exhibits no discharge.  Neck: Neck supple.  Cardiovascular: Normal rate, regular rhythm and intact distal pulses.  Pulmonary/Chest: Effort normal and breath sounds normal. No respiratory distress. He has no wheezes. He has  no rales.  Abdominal: Soft. Bowel sounds are normal. He exhibits no distension. There is no abdominal tenderness. There is no guarding.  Musculoskeletal:        General: Edema present.     Comments: +3 to +4 pitting edema bilateral lower extremities Edema of the dorsum of the left hand  Neurological: He is alert and oriented to person, place, and time.  Skin: Skin is warm and dry. He is not diaphoretic.  Large areas of ecchymosis of left upper extremity (see images) Small area of ecchymosis noted on the right lower extremity Abrasions noted on both knees           Labs on Admission: I have personally reviewed following labs and imaging studies  CBC: Recent Labs  Lab 02/25/19 1729  WBC 8.0  NEUTROABS 5.9  HGB 6.9*  HCT 19.7*  MCV 97.5  PLT 580*   Basic Metabolic Panel: Recent Labs  Lab 02/25/19 1729  NA 124*  K 3.4*  CL 89*  CO2 22  GLUCOSE 120*  BUN 10  CREATININE 1.35*  CALCIUM 8.6*   GFR: Estimated Creatinine Clearance: 58.4 mL/min (A) (by C-G formula based on SCr of 1.35 mg/dL (H)). Liver Function Tests: Recent Labs  Lab 02/25/19 1729  AST 112*  ALT 67*  ALKPHOS 120  BILITOT 2.1*  PROT 5.5*  ALBUMIN 3.4*   No results for input(s): LIPASE, AMYLASE in the last 168 hours. No results for input(s): AMMONIA in the last  168 hours. Coagulation Profile: No results for input(s): INR, PROTIME in the last 168 hours. Cardiac Enzymes: No results for input(s): CKTOTAL, CKMB, CKMBINDEX, TROPONINI in the last 168 hours. BNP (last 3 results) No results for input(s): PROBNP in the last 8760 hours. HbA1C: No results for input(s): HGBA1C in the last 72 hours. CBG: No results for input(s): GLUCAP in the last 168 hours. Lipid Profile: No results for input(s): CHOL, HDL, LDLCALC, TRIG, CHOLHDL, LDLDIRECT in the last 72 hours. Thyroid Function Tests: No results for input(s): TSH, T4TOTAL, FREET4, T3FREE, THYROIDAB in the last 72 hours. Anemia Panel: No results for input(s): VITAMINB12, FOLATE, FERRITIN, TIBC, IRON, RETICCTPCT in the last 72 hours. Urine analysis:    Component Value Date/Time   COLORURINE YELLOW 08/11/2018 1119   APPEARANCEUR CLEAR 08/11/2018 1119   LABSPEC 1.005 08/11/2018 1119   PHURINE 6.0 08/11/2018 1119   GLUCOSEU NEGATIVE 08/11/2018 1119   HGBUR SMALL (A) 08/11/2018 1119   BILIRUBINUR NEGATIVE 08/11/2018 1119   KETONESUR NEGATIVE 08/11/2018 1119   PROTEINUR NEGATIVE 08/11/2018 1119   NITRITE NEGATIVE 08/11/2018 1119   LEUKOCYTESUR NEGATIVE 08/11/2018 1119    Radiological Exams on Admission: Dg Chest 2 View  Result Date: 02/25/2019 CLINICAL DATA:  Shortness of breath. EXAM: CHEST - 2 VIEW COMPARISON:  August 11, 2018. FINDINGS: The heart size and mediastinal contours are within normal limits. Both lungs are clear. No pneumothorax or pleural effusion is noted. The visualized skeletal structures are unremarkable. IMPRESSION: No active cardiopulmonary disease. Electronically Signed   By: Marijo Conception M.D.   On: 02/25/2019 18:27   Ct Head Wo Contrast  Result Date: 02/25/2019 CLINICAL DATA:  Posttraumatic headache after multiple falls. EXAM: CT HEAD WITHOUT CONTRAST TECHNIQUE: Contiguous axial images were obtained from the base of the skull through the vertex without intravenous contrast.  COMPARISON:  August 11, 2018. FINDINGS: Brain: Mild diffuse cortical atrophy is noted. Mild chronic ischemic white matter disease is noted. No mass effect or midline shift is noted. Ventricular size is  within normal limits. There is no evidence of mass lesion, hemorrhage or acute infarction. Vascular: No hyperdense vessel or unexpected calcification. Skull: Normal. Negative for fracture or focal lesion. Sinuses/Orbits: No acute finding. Other: None. IMPRESSION: Mild diffuse cortical atrophy. Mild chronic ischemic white matter disease. No acute intracranial abnormality seen. Electronically Signed   By: Marijo Conception M.D.   On: 02/25/2019 18:42    EKG: Independently reviewed.  Sinus rhythm.  Assessment/Plan Principal Problem:   GI bleed Active Problems:   Symptomatic anemia   Falls   Bilateral lower extremity edema   Hyponatremia   GI bleed, symptomatic acute blood loss anemia Patient endorses dyspnea on exertion.  Denies hematemesis, hematochezia, or melena.  Hemodynamically stable.  FOBT positive.  No melena seen on digital rectal exam done in the ED.  Hemoglobin 6.9, was 8.0 in April 2020.  Colonoscopy done April 2019 showing one 8 mm polyp in the sigmoid colon which was resected, one 8 mm polyp in the descending colon which was resected, diverticulosis in the left and right colon, external and internal hemorrhoids, 2 nonbleeding colonic angiodysplastic lesions.  Polyps were consistent with tubular adenoma, negative for high-grade dysplasia or malignancy.  Does have large areas of ecchymosis from recent falls which is also likely contributing to his anemia.  Takes Eliquis for A. fib.  ED provider discussed the case with Dr. Carlean Purl, recommended holding Eliquis and keeping patient NPO after 5 AM.  He may undergo possible EGD tomorrow. -Type and screen -1 unit PRBCs ordered in the ED -Continue home iron supplement -Continue to monitor hemoglobin and hematocrit -Anemia panel -Hold Eliquis -Keep  n.p.o. after 5 AM -Appreciate GI recs  Recent falls Head CT negative for acute finding.  Patient noted to have large areas of ecchymosis to his left upper extremity and a small area of ecchymosis to the right lower extremity. -B12 level -PT evaluation -Fall precautions  DOE and Bilateral lower extremity edema Patient noted to have significant +3 to +4 pitting edema of bilateral lower extremities.  BNP 209.  Chest x-ray negative for pulmonary edema.  Oxygen saturation in the 80s on room air when patient moved in the bed and had to push himself up but then improved to 94% on room air shortly afterwards in the ED.   However, patient does endorse dyspnea on exertion for the past few months.  Blood loss anemia could also be contributing to his dyspnea on exertion. -Cardiac monitoring -Echocardiogram -Received IV Lasix 20 mg in the ED.  Blood pressure stable.  Continue IV Lasix 20 mg twice daily starting in the morning. -Monitor intake and output, daily weights, low-sodium diet with fluid restriction -Continue to monitor renal function with IV diuresis  Hypervolemic hyponatremia Sodium 124.  Not on a thiazide diuretic. -IV Lasix as above -Continue to monitor sodium level -Check serum osmolarity  Hypokalemia Potassium 3.4. -Replete potassium.  Check magnesium level and replete if low.  Continue to monitor electrolytes.  Thrombocytopenia Platelet count 117,000, previously low in April 2020 as well.  -Hold Eliquis -Continue to monitor platelet level  Elevated LFTs AST 112, ALT 67, T bili 2.1.  Alk phos normal.  LFTs were normal 2 years ago.  No complaints of abdominal pain, nausea, vomiting.  Abdominal exam benign.  Does have a past medical history of alcoholism, currently in remission. -Right upper quadrant ultrasound -Continue to monitor LFTs  Epistaxis Patient noted to have dried blood at his left nostril, no active bleeding.  He is not sure  if he has had any nosebleeds  recently. -Hold Eliquis -Continue to monitor  Paroxysmal atrial fibrillation  -Hold Eliquis in the setting of suspected GI bleed/worsening anemia -Continue metoprolol for rate control.  Blood pressure stable.  Hyperlipidemia -Continue Lipitor  Depression -Continue Lexapro  Insomnia -Continue trazodone  GERD -Continue PPI  BPH -Continue Flomax  DVT prophylaxis: SCDs Code Status: Patient wishes to be full code. Family Communication: No family available. Disposition Plan: Anticipate discharge in 1 to 2 days. Consults called: GI Admission status: It is my clinical opinion that referral for OBSERVATION is reasonable and necessary in this patient based on the above information provided. The aforementioned taken together are felt to place the patient at high risk for further clinical deterioration. However it is anticipated that the patient may be medically stable for discharge from the hospital within 24 to 48 hours.  The medical decision making on this patient was of high complexity and the patient is at high risk for clinical deterioration, therefore this is a level 3 visit.  Shela Leff MD [facility] Pager [phone]  If 7PM-7AM, please contact night-coverage [email or web address] Password [email or web address]  02/25/2019, 8:59 PM

## 2019-02-25 NOTE — ED Triage Notes (Addendum)
Patient arrived from his PCP office reporting that he was instructed to come here because he's hemoglobin is 6. PT says he fell 3 times last week from losing his balance- left arms bruises Bilateral legs swelling X 10days PT takes Eliquis daily for AFIB

## 2019-02-26 ENCOUNTER — Observation Stay (HOSPITAL_BASED_OUTPATIENT_CLINIC_OR_DEPARTMENT_OTHER): Payer: Medicare Other

## 2019-02-26 ENCOUNTER — Encounter (HOSPITAL_COMMUNITY): Payer: Self-pay

## 2019-02-26 DIAGNOSIS — M1990 Unspecified osteoarthritis, unspecified site: Secondary | ICD-10-CM | POA: Diagnosis not present

## 2019-02-26 DIAGNOSIS — R2681 Unsteadiness on feet: Secondary | ICD-10-CM | POA: Diagnosis not present

## 2019-02-26 DIAGNOSIS — E876 Hypokalemia: Secondary | ICD-10-CM | POA: Diagnosis not present

## 2019-02-26 DIAGNOSIS — E8770 Fluid overload, unspecified: Secondary | ICD-10-CM | POA: Diagnosis not present

## 2019-02-26 DIAGNOSIS — M19042 Primary osteoarthritis, left hand: Secondary | ICD-10-CM | POA: Diagnosis present

## 2019-02-26 DIAGNOSIS — Z791 Long term (current) use of non-steroidal anti-inflammatories (NSAID): Secondary | ICD-10-CM | POA: Diagnosis not present

## 2019-02-26 DIAGNOSIS — D696 Thrombocytopenia, unspecified: Secondary | ICD-10-CM | POA: Diagnosis present

## 2019-02-26 DIAGNOSIS — R2689 Other abnormalities of gait and mobility: Secondary | ICD-10-CM | POA: Diagnosis not present

## 2019-02-26 DIAGNOSIS — R040 Epistaxis: Secondary | ICD-10-CM | POA: Diagnosis present

## 2019-02-26 DIAGNOSIS — K2971 Gastritis, unspecified, with bleeding: Secondary | ICD-10-CM | POA: Diagnosis not present

## 2019-02-26 DIAGNOSIS — M6281 Muscle weakness (generalized): Secondary | ICD-10-CM | POA: Diagnosis not present

## 2019-02-26 DIAGNOSIS — K219 Gastro-esophageal reflux disease without esophagitis: Secondary | ICD-10-CM | POA: Diagnosis not present

## 2019-02-26 DIAGNOSIS — R7989 Other specified abnormal findings of blood chemistry: Secondary | ICD-10-CM

## 2019-02-26 DIAGNOSIS — L8996 Pressure-induced deep tissue damage of unspecified site: Secondary | ICD-10-CM | POA: Diagnosis not present

## 2019-02-26 DIAGNOSIS — Z9181 History of falling: Secondary | ICD-10-CM | POA: Diagnosis not present

## 2019-02-26 DIAGNOSIS — I5033 Acute on chronic diastolic (congestive) heart failure: Secondary | ICD-10-CM | POA: Diagnosis not present

## 2019-02-26 DIAGNOSIS — R600 Localized edema: Secondary | ICD-10-CM | POA: Diagnosis not present

## 2019-02-26 DIAGNOSIS — K226 Gastro-esophageal laceration-hemorrhage syndrome: Secondary | ICD-10-CM | POA: Diagnosis not present

## 2019-02-26 DIAGNOSIS — I480 Paroxysmal atrial fibrillation: Secondary | ICD-10-CM | POA: Diagnosis not present

## 2019-02-26 DIAGNOSIS — K208 Other esophagitis without bleeding: Secondary | ICD-10-CM | POA: Diagnosis not present

## 2019-02-26 DIAGNOSIS — N183 Chronic kidney disease, stage 3 unspecified: Secondary | ICD-10-CM | POA: Diagnosis not present

## 2019-02-26 DIAGNOSIS — W19XXXD Unspecified fall, subsequent encounter: Secondary | ICD-10-CM | POA: Diagnosis not present

## 2019-02-26 DIAGNOSIS — F1020 Alcohol dependence, uncomplicated: Secondary | ICD-10-CM | POA: Diagnosis present

## 2019-02-26 DIAGNOSIS — I10 Essential (primary) hypertension: Secondary | ICD-10-CM | POA: Diagnosis not present

## 2019-02-26 DIAGNOSIS — Z20828 Contact with and (suspected) exposure to other viral communicable diseases: Secondary | ICD-10-CM | POA: Diagnosis not present

## 2019-02-26 DIAGNOSIS — F1021 Alcohol dependence, in remission: Secondary | ICD-10-CM | POA: Diagnosis present

## 2019-02-26 DIAGNOSIS — K3189 Other diseases of stomach and duodenum: Secondary | ICD-10-CM | POA: Diagnosis not present

## 2019-02-26 DIAGNOSIS — Z7401 Bed confinement status: Secondary | ICD-10-CM | POA: Diagnosis not present

## 2019-02-26 DIAGNOSIS — K922 Gastrointestinal hemorrhage, unspecified: Secondary | ICD-10-CM | POA: Diagnosis not present

## 2019-02-26 DIAGNOSIS — R739 Hyperglycemia, unspecified: Secondary | ICD-10-CM | POA: Diagnosis not present

## 2019-02-26 DIAGNOSIS — E871 Hypo-osmolality and hyponatremia: Secondary | ICD-10-CM | POA: Diagnosis not present

## 2019-02-26 DIAGNOSIS — R58 Hemorrhage, not elsewhere classified: Secondary | ICD-10-CM | POA: Diagnosis not present

## 2019-02-26 DIAGNOSIS — R531 Weakness: Secondary | ICD-10-CM | POA: Diagnosis not present

## 2019-02-26 DIAGNOSIS — R0602 Shortness of breath: Secondary | ICD-10-CM | POA: Diagnosis present

## 2019-02-26 DIAGNOSIS — W19XXXA Unspecified fall, initial encounter: Secondary | ICD-10-CM | POA: Diagnosis not present

## 2019-02-26 DIAGNOSIS — I4891 Unspecified atrial fibrillation: Secondary | ICD-10-CM | POA: Diagnosis not present

## 2019-02-26 DIAGNOSIS — E785 Hyperlipidemia, unspecified: Secondary | ICD-10-CM | POA: Diagnosis not present

## 2019-02-26 DIAGNOSIS — F329 Major depressive disorder, single episode, unspecified: Secondary | ICD-10-CM | POA: Diagnosis not present

## 2019-02-26 DIAGNOSIS — K2100 Gastro-esophageal reflux disease with esophagitis, without bleeding: Secondary | ICD-10-CM | POA: Diagnosis not present

## 2019-02-26 DIAGNOSIS — I130 Hypertensive heart and chronic kidney disease with heart failure and stage 1 through stage 4 chronic kidney disease, or unspecified chronic kidney disease: Secondary | ICD-10-CM | POA: Diagnosis not present

## 2019-02-26 DIAGNOSIS — N400 Enlarged prostate without lower urinary tract symptoms: Secondary | ICD-10-CM | POA: Diagnosis not present

## 2019-02-26 DIAGNOSIS — D62 Acute posthemorrhagic anemia: Secondary | ICD-10-CM | POA: Diagnosis not present

## 2019-02-26 DIAGNOSIS — R7401 Elevation of levels of liver transaminase levels: Secondary | ICD-10-CM | POA: Diagnosis present

## 2019-02-26 DIAGNOSIS — G4700 Insomnia, unspecified: Secondary | ICD-10-CM | POA: Diagnosis not present

## 2019-02-26 DIAGNOSIS — I5031 Acute diastolic (congestive) heart failure: Secondary | ICD-10-CM | POA: Diagnosis not present

## 2019-02-26 DIAGNOSIS — N1831 Chronic kidney disease, stage 3a: Secondary | ICD-10-CM | POA: Diagnosis not present

## 2019-02-26 DIAGNOSIS — K2970 Gastritis, unspecified, without bleeding: Secondary | ICD-10-CM | POA: Diagnosis not present

## 2019-02-26 DIAGNOSIS — D649 Anemia, unspecified: Secondary | ICD-10-CM | POA: Diagnosis not present

## 2019-02-26 DIAGNOSIS — M2550 Pain in unspecified joint: Secondary | ICD-10-CM | POA: Diagnosis not present

## 2019-02-26 LAB — HEPATITIS PANEL, ACUTE
HCV Ab: NONREACTIVE
Hep A IgM: NONREACTIVE
Hep B C IgM: NONREACTIVE
Hepatitis B Surface Ag: NONREACTIVE

## 2019-02-26 LAB — HEPATIC FUNCTION PANEL
ALT: 56 U/L — ABNORMAL HIGH (ref 0–44)
AST: 93 U/L — ABNORMAL HIGH (ref 15–41)
Albumin: 3 g/dL — ABNORMAL LOW (ref 3.5–5.0)
Alkaline Phosphatase: 107 U/L (ref 38–126)
Bilirubin, Direct: 0.9 mg/dL — ABNORMAL HIGH (ref 0.0–0.2)
Indirect Bilirubin: 1.7 mg/dL — ABNORMAL HIGH (ref 0.3–0.9)
Total Bilirubin: 2.6 mg/dL — ABNORMAL HIGH (ref 0.3–1.2)
Total Protein: 5.3 g/dL — ABNORMAL LOW (ref 6.5–8.1)

## 2019-02-26 LAB — CBC
HCT: 20.4 % — ABNORMAL LOW (ref 39.0–52.0)
HCT: 24 % — ABNORMAL LOW (ref 39.0–52.0)
Hemoglobin: 6.9 g/dL — CL (ref 13.0–17.0)
Hemoglobin: 8.4 g/dL — ABNORMAL LOW (ref 13.0–17.0)
MCH: 32.1 pg (ref 26.0–34.0)
MCH: 32.4 pg (ref 26.0–34.0)
MCHC: 33.8 g/dL (ref 30.0–36.0)
MCHC: 35 g/dL (ref 30.0–36.0)
MCV: 94.9 fL (ref 80.0–100.0)
MCV: 92.7 fL (ref 80.0–100.0)
Platelets: 101 10*3/uL — ABNORMAL LOW (ref 150–400)
Platelets: 112 10*3/uL — ABNORMAL LOW (ref 150–400)
RBC: 2.15 MIL/uL — ABNORMAL LOW (ref 4.22–5.81)
RBC: 2.59 MIL/uL — ABNORMAL LOW (ref 4.22–5.81)
RDW: 16.3 % — ABNORMAL HIGH (ref 11.5–15.5)
RDW: 16.9 % — ABNORMAL HIGH (ref 11.5–15.5)
WBC: 4.9 10*3/uL (ref 4.0–10.5)
WBC: 5.9 10*3/uL (ref 4.0–10.5)
nRBC: 0.4 % — ABNORMAL HIGH (ref 0.0–0.2)
nRBC: 0.7 % — ABNORMAL HIGH (ref 0.0–0.2)

## 2019-02-26 LAB — BASIC METABOLIC PANEL
Anion gap: 12 (ref 5–15)
BUN: 9 mg/dL (ref 8–23)
CO2: 25 mmol/L (ref 22–32)
Calcium: 8.6 mg/dL — ABNORMAL LOW (ref 8.9–10.3)
Chloride: 92 mmol/L — ABNORMAL LOW (ref 98–111)
Creatinine, Ser: 1.32 mg/dL — ABNORMAL HIGH (ref 0.61–1.24)
GFR calc Af Amer: 60 mL/min — ABNORMAL LOW (ref >60)
GFR calc non Af Amer: 52 mL/min — ABNORMAL LOW (ref >60)
Glucose, Bld: 108 mg/dL — ABNORMAL HIGH (ref 70–99)
Potassium: 3.3 mmol/L — ABNORMAL LOW (ref 3.5–5.1)
Sodium: 129 mmol/L — ABNORMAL LOW (ref 135–145)

## 2019-02-26 LAB — ECHOCARDIOGRAM COMPLETE
Height: 72 in
Weight: 4218.72 oz

## 2019-02-26 LAB — IRON AND TIBC
Iron: 45 ug/dL (ref 45–182)
Saturation Ratios: 21 % (ref 17.9–39.5)
TIBC: 218 ug/dL — ABNORMAL LOW (ref 250–450)
UIBC: 173 ug/dL

## 2019-02-26 LAB — RETICULOCYTES
Immature Retic Fract: 29 % — ABNORMAL HIGH (ref 2.3–15.9)
RBC.: 2.15 MIL/uL — ABNORMAL LOW (ref 4.22–5.81)
Retic Count, Absolute: 107.3 10*3/uL (ref 19.0–186.0)
Retic Ct Pct: 5 % — ABNORMAL HIGH (ref 0.4–3.1)

## 2019-02-26 LAB — OSMOLALITY: Osmolality: 262 mOsm/kg — ABNORMAL LOW (ref 275–295)

## 2019-02-26 LAB — FOLATE: Folate: 22.4 ng/mL (ref >5.9)

## 2019-02-26 LAB — FERRITIN: Ferritin: 349 ng/mL — ABNORMAL HIGH (ref 24–336)

## 2019-02-26 LAB — VITAMIN B12: Vitamin B-12: 720 pg/mL (ref 180–914)

## 2019-02-26 LAB — SARS CORONAVIRUS 2 (TAT 6-24 HRS): SARS Coronavirus 2: NEGATIVE

## 2019-02-26 LAB — MAGNESIUM: Magnesium: 1.2 mg/dL — ABNORMAL LOW (ref 1.7–2.4)

## 2019-02-26 LAB — PREPARE RBC (CROSSMATCH)

## 2019-02-26 MED ORDER — SILVER SULFADIAZINE 1 % EX CREA
TOPICAL_CREAM | Freq: Two times a day (BID) | CUTANEOUS | Status: DC
  Administered 2019-02-26 – 2019-02-27 (×3): via TOPICAL
  Administered 2019-02-28: 1 via TOPICAL
  Administered 2019-02-28 – 2019-03-02 (×4): via TOPICAL
  Administered 2019-03-02: 1 via TOPICAL
  Administered 2019-03-03: 21:00:00 via TOPICAL
  Administered 2019-03-03 – 2019-03-04 (×2): 1 via TOPICAL
  Administered 2019-03-05: 08:00:00 via TOPICAL
  Filled 2019-02-26: qty 85

## 2019-02-26 MED ORDER — MAGNESIUM SULFATE 4 GM/100ML IV SOLN
4.0000 g | Freq: Once | INTRAVENOUS | Status: AC
  Administered 2019-02-26: 4 g via INTRAVENOUS
  Filled 2019-02-26: qty 100

## 2019-02-26 MED ORDER — POTASSIUM CHLORIDE CRYS ER 20 MEQ PO TBCR
40.0000 meq | EXTENDED_RELEASE_TABLET | Freq: Once | ORAL | Status: AC
  Administered 2019-02-26: 40 meq via ORAL
  Filled 2019-02-26: qty 2

## 2019-02-26 MED ORDER — SODIUM CHLORIDE 0.9% IV SOLUTION
Freq: Once | INTRAVENOUS | Status: AC
  Administered 2019-02-26: 13:00:00 via INTRAVENOUS

## 2019-02-26 MED ORDER — MICONAZOLE NITRATE POWD
Freq: Three times a day (TID) | Status: DC
  Administered 2019-02-27 – 2019-03-03 (×12): via TOPICAL
  Administered 2019-03-03 (×2): 1 via TOPICAL
  Administered 2019-03-04 (×2): via TOPICAL

## 2019-02-26 MED ORDER — SODIUM CHLORIDE 0.9 % IV SOLN: INTRAVENOUS | Status: DC

## 2019-02-26 NOTE — Evaluation (Signed)
Physical Therapy Evaluation Patient Details Name: Darryl Diaz MRN: [id number] DOB: Mar 15, 1941 Today's Date: 02/26/2019   History of Present Illness  78 y.o. male admitted on 02/25/19 for low Hgb sent in by his PCP.  Hgb 6.9.  Pt found to have acute CHF exacerbation with bil LE swelling, holding eliquis due to 3 recent falls (h/o PAF), hyponatremia, frequent falls wiht head CT negative, multiple bruised/scrapes in all extremities, elevated LFTs.  Pt with other significant PMH of OA, HTN, insomnia, colon polyps, h/o ETOH use in remission, L hip closed reduction, L hip arthroplasty, L ankle surgery.    Clinical Impression  Pt with three falls in the past week.  He has bruises and scrapes all over his extremities.  Our evaluation was limited today by continued symptomatic anemia.  He is just now finishing a unit of blood.  Hgb last measured was 6.9 and he was lightheaded earlier on his feet with RN staff.  Dr. Broadus John advised PT stay at bed level/EOB today and attempt gait tomorrow.  He would benefit from post acute rehab at SNF (has been to [facility] in the past) if he qualifies.   PT to follow acutely for deficits listed below.      Follow Up Recommendations SNF(prefernece is [facility] if he qualifies)    Equipment Recommendations  None recommended by PT    Recommendations for Other Services   NA    Precautions / Restrictions Precautions Precautions: Fall Precaution Comments: 3 falls in the past week, before that only one in the past year.       Mobility  Bed Mobility Overal bed mobility: Needs Assistance Bed Mobility: Supine to Sit;Sit to Supine;Rolling Rolling: Modified independent (Device/Increase time)   Supine to sit: Supervision Sit to supine: Supervision   General bed mobility comments: Pt heavily reliant on bed rail, but does have bed rails at home. supervision for safety  Transfers Overall transfer level: Needs assistance Equipment used: Rolling walker (2  wheeled) Transfers: Sit to/from Stand Sit to Stand: Min assist         General transfer comment: Min assist to stand EOB.  MD and RN advised no walking as his Hgb is still low and he became lightheaded earlier on his feet.  PT stayed EOB with pt for this reason.   Ambulation/Gait Ambulation/Gait assistance: Min assist Gait Distance (Feet): 3 Feet Assistive device: Rolling walker (2 wheeled) Gait Pattern/deviations: Step-to pattern     General Gait Details: Pt was able to march in place and side step to [facility].  No reports of lightheadedness.           Balance Overall balance assessment: Needs assistance Sitting-balance support: Feet supported;Bilateral upper extremity supported Sitting balance-Leahy Scale: Fair Sitting balance - Comments: posterior preference, especially when moving legs for MMT Postural control: Posterior lean Standing balance support: Bilateral upper extremity supported Standing balance-Leahy Scale: Poor Standing balance comment: needs external support in standing from therapist and RW.                               Pertinent Vitals/Pain Pain Assessment: No/denies pain    Home Living Family/patient expects to be discharged to:: Private residence Living Arrangements: Alone Available Help at Discharge: Family;Available PRN/intermittently(live nearby sister and brother in law) Type of Home: House Home Access: Level entry     Home Layout: Two level;Able to live on main level with bedroom/bathroom Home Equipment: Gilford Rile - 2 wheels;Cane - single  point;Shower seat;Shower seat - built in;Walker - 4 wheels;Toilet riser(rails on the bed) Additional Comments: still drives    Prior Function Level of Independence: Independent with assistive device(s)         Comments: started using a RW this past week, has a maid that comes every other week and yard service.      Hand Dominance   Dominant Hand: Right    Extremity/Trunk Assessment   Upper  Extremity Assessment Upper Extremity Assessment: Generalized weakness(L UE bruising, swelling, and abrasion)    Lower Extremity Assessment Lower Extremity Assessment: Generalized weakness    Cervical / Trunk Assessment Cervical / Trunk Assessment: Kyphotic  Communication   Communication: No difficulties  Cognition Arousal/Alertness: Awake/alert Behavior During Therapy: WFL for tasks assessed/performed Overall Cognitive Status: Impaired/Different from baseline Area of Impairment: Memory                     Memory: Decreased short-term memory         General Comments: Pt unable to tell me his address.              Assessment/Plan    PT Assessment Patient needs continued PT services  PT Problem List Decreased strength;Decreased activity tolerance;Decreased balance;Decreased mobility;Decreased knowledge of use of DME;Decreased skin integrity       PT Treatment Interventions DME instruction;Gait training;Stair training;Functional mobility training;Therapeutic activities;Therapeutic exercise;Balance training;Patient/family education    PT Goals (Current goals can be found in the Care Plan section)  Acute Rehab PT Goals Patient Stated Goal: to stop falling PT Goal Formulation: With patient Time For Goal Achievement: 03/12/19 Potential to Achieve Goals: Good    Frequency Min 3X/week   Barriers to discharge Decreased caregiver support         AM-PAC PT "6 Clicks" Mobility  Outcome Measure Help needed turning from your back to your side while in a flat bed without using bedrails?: A Little Help needed moving from lying on your back to sitting on the side of a flat bed without using bedrails?: A Little Help needed moving to and from a bed to a chair (including a wheelchair)?: A Little Help needed standing up from a chair using your arms (e.g., wheelchair or bedside chair)?: A Little Help needed to walk in hospital room?: A Little Help needed climbing 3-5 steps  with a railing? : A Little 6 Click Score: 18    End of Session   Activity Tolerance: Patient limited by fatigue Patient left: in bed;with call bell/phone within reach;with bed alarm set Nurse Communication: Mobility status PT Visit Diagnosis: Muscle weakness (generalized) (M62.81);Difficulty in walking, not elsewhere classified (R26.2)    Time: 4128-7867 PT Time Calculation (min) (ACUTE ONLY): 28 min   Charges:   PT Evaluation $PT Eval Moderate Complexity: 1 Mod PT Treatments $Therapeutic Activity: 8-22 mins      Bellamarie Pflug B. Blayklee Mable, PT, DPT  Acute Rehabilitation [phone] pager [phone] office  @ [place]: [phone]   02/26/2019, 3:31 PM

## 2019-02-26 NOTE — H&P (View-Only) (Signed)
Consultation  Referring Provider:  Dr. Broadus John    Primary Care Physician:  Osborne Casco [name], MD Primary Gastroenterologist:  Dr. 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 male with a past medical history of AFib on Eliquis and other listed below, presented to the ER on 02/25/2023 evaluation of low hemoglobin, recent falls and bilateral lower extremity edema.    Today, the patient explains that he had blood work done by his primary care provider yesterday and was told that his hemoglobin was 6.0.  Apparently this was 11 about 8 months ago.  Does describe that he has had three occasions of losing his balance and had 3 falls within the past week.  Also describes worsening bilateral lower extremity edema for the past month with dyspnea on exertion for the past few months.  Other than this, the patient tells me that he feels fine.  He does have chronic reflux which is controlled on Protonix 40 mg daily.  Denies abdominal pain or change in bowel habits, he has not seen any blood in his stools bright red or black.    Last dose of Eliquis was yesterday morning 02/25/2019 around 9 AM.    Denies fever, chills, weight loss, anorexia, nausea, vomiting or symptoms that awaken him from sleep.  ED course: Oxygen saturation 80% on room air, FOBT positive, no melena seen on digital exam, hemoglobin 6.9, was 8 in April 2020, platelet count 117, potassium 3.4, AST 112, ALT 67 [place] total bili 2.1, chest x-ray with no active cardiopulmonary disease, 1 unit PRBCs and IV Lasix 20 mg was ordered.  GI history: 08/07/2017 colonoscopy by Dr. Carlean Purl: - One 8 mm polyp in the sigmoid colon, removed with a hot snare. Resected and retrieved. Clip (MR conditional) was placed. - One 5 mm polyp in the descending colon, removed with a cold snare. Resected and retrieved. - Diverticulosis in the left colon and in the right colon. - External and internal  hemorrhoids. - Two non-bleeding colonic angiodysplastic lesions. Tiny adjacent and in cecum - The examination was otherwise normal on direct and retroflexion views.  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   OA (osteoarthritis)    hands    Past Surgical History:  Procedure Laterality Date   ANKLE SURGERY Left 2014   had rods put in    [place] Left 04/01/2016   Procedure: ARTHROPLASTY BIPOLAR HIP (HEMIARTHROPLASTY);  Surgeon: Paralee Cancel, MD;  Location: [facility];  Service: Orthopedics;  Laterality: Left;   HIP CLOSED REDUCTION Left 04/30/2016   Procedure: CLOSED REDUCTION HIP;  Surgeon: Wylene Simmer, MD;  Location: [facility];  Service: Orthopedics;  Laterality: Left;   TONSILLECTOMY      Family History  Problem Relation Age of Onset   Heart disease Mother 39   Hypertension Mother    Arthritis Mother    Heart failure Father 92   Stroke Maternal  Aunt    Heart failure Sister    Colon cancer Neg Hx    Esophageal cancer Neg Hx    Stomach cancer Neg Hx    Rectal cancer Neg Hx     Social History   Tobacco Use   Smoking status: Former Smoker   Smokeless tobacco: Never Used  Substance Use Topics   Alcohol use: No    Comment: 03/2016   i HAD ONE DRINK & THAT WAS THE FIRST DRINK i HAD IN A LONG TIME    Drug use: No    Prior to Admission medications   Medication Sig Start Date End Date Taking? Authorizing Provider  acetaminophen (TYLENOL) 500 MG tablet Take 1,000 mg by mouth every 6 (six) hours as needed for headache (pain).   Yes [provider]  apixaban (ELIQUIS) 5 MG TABS  tablet Take 1 tablet (5 mg total) by mouth 2 (two) times daily. 08/24/18  Yes Vann, Jessica U, DO  atorvastatin (LIPITOR) 20 MG tablet Take 20 mg by mouth daily.   Yes [provider]  escitalopram (LEXAPRO) 10 MG tablet Take 10 mg by mouth daily.   Yes [provider]  ferrous sulfate 325 (65 FE) MG EC tablet Take 325 mg by mouth daily.    Yes [provider]  folic acid (FOLVITE) 1 MG tablet Take 1 mg by mouth daily.   Yes [provider]  loratadine (CLARITIN) 10 MG tablet Take 10 mg by mouth daily.   Yes [provider]  metoprolol (TOPROL-XL) 200 MG 24 hr tablet Take 200 mg by mouth daily.   Yes [provider]  Multiple Vitamin (MULTIVITAMIN WITH MINERALS) TABS tablet Take 1 tablet by mouth daily.   Yes [provider]  pantoprazole (PROTONIX) 40 MG tablet Take 40 mg by mouth daily.   Yes [provider]  tamsulosin (FLOMAX) 0.4 MG CAPS capsule Take 0.4 mg by mouth.   Yes [provider]  Thiamine HCl (VITAMIN B-1) 250 MG tablet Take 250 mg by mouth daily.   Yes [provider]  traZODone (DESYREL) 50 MG tablet Take 50 mg by mouth at bedtime.   Yes [provider]    Current Facility-Administered Medications  Medication Dose Route Frequency Provider Last Rate Last Dose   0.9 %  sodium chloride infusion (Manually program via Guardrails IV Fluids)   Intravenous Once Domenic Polite, MD       acetaminophen (TYLENOL) tablet 1,000 mg  1,000 mg Oral Q6H PRN Shela Leff, MD       atorvastatin (LIPITOR) tablet 20 mg  20 mg Oral Daily Shela Leff, MD       escitalopram (LEXAPRO) tablet 10 mg  10 mg Oral Daily Shela Leff, MD       ferrous sulfate tablet 325 mg  325 mg Oral Daily Shela Leff, MD       folic acid (FOLVITE) tablet 1 mg  1 mg Oral Daily Shela Leff, MD       furosemide (LASIX) injection 20 mg  20 mg Intravenous BID Shela Leff, MD        loratadine (CLARITIN) tablet 10 mg  10 mg Oral Daily Shela Leff, MD       metoprolol succinate (TOPROL-XL) 24 hr tablet 200 mg  200 mg Oral Daily Shela Leff, MD       multivitamin with minerals tablet 1 tablet  1 tablet Oral Daily Shela Leff, MD       pantoprazole (PROTONIX) EC  tablet 40 mg  40 mg Oral Daily Shela Leff, MD       tamsulosin [facility]) capsule 0.4 mg  0.4 mg Oral Daily Shela Leff, MD   0.4 mg at 02/25/19 2250   thiamine (VITAMIN B-1) tablet 250 mg  250 mg Oral Daily Shela Leff, MD       traZODone (DESYREL) tablet 50 mg  50 mg Oral QHS Shela Leff, MD   50 mg at 02/25/19 2250    Allergies as of 02/25/2019 - Review Complete 02/25/2019  Allergen Reaction Noted   Penicillins Other (See Comments) 03/30/2016     Review of Systems:    Constitutional: No weight loss, fever or chills Skin: +bruising Cardiovascular: No chest pain   Respiratory: +DOE Gastrointestinal: See HPI and otherwise negative Genitourinary: No dysuria Neurological:+dizziness Musculoskeletal: No new muscle or joint pain Hematologic: +bruising Psychiatric: No history of depression or anxiety    Physical Exam:  Vital signs in last 24 hours: Temp:  [98.3 F (36.8 C)-98.9 F (37.2 C)] 98.9 F (37.2 C) (10/20 0500) Pulse Rate:  [75-86] 77 (10/19 2233) Resp:  [14-29] 20 (10/20 0500) BP: (119-137)/(52-71) 130/57 (10/20 0500) SpO2:  [93 %-100 %] 99 % (10/20 0500) Weight:  [108.9 kg-119.6 kg] 119.6 kg (10/20 0500) Last BM Date: 02/25/19 General:   Pleasant Caucasian male appears to be in NAD, Well developed, Well nourished, alert and cooperative Head:  Normocephalic and atraumatic. Eyes:   PEERL, EOMI. No icterus. Conjunctiva pink. Ears:  Normal auditory acuity. Neck:  Supple Throat: Oral cavity and pharynx without inflammation, swelling or lesion.  Lungs: Respirations even and unlabored. Lungs clear to auscultation bilaterally.   No wheezes,  crackles, or rhonchi.  Heart: Normal S1, S2. No MRG. Regular rate and rhythm. + 3 to + 4 pitting edema bilateral lower extremities Abdomen:  Soft, nondistended, nontender. No rebound or guarding. Normal bowel sounds. No appreciable masses or hepatomegaly. Rectal:  Not performed.  Msk:  Symmetrical without gross deformities. Peripheral pulses intact.  Extremities: No deformities + Large areas of ecchymosis of left upper extremity and right lower extremity, abrasions on both knees Neurologic:  Alert and  oriented x4;  grossly normal neurologically.  Skin:   Dry and intact without significant lesions or rashes. Psychiatric: Demonstrates good judgement and reason without abnormal affect or behaviors.  LAB RESULTS: Recent Labs    02/25/19 1729 02/26/19 0557  WBC 8.0 4.9  HGB 6.9* 6.9*  HCT 19.7* 20.4*  PLT 117* 101*   BMET Recent Labs    02/25/19 1729 02/26/19 0557  NA 124* 129*  K 3.4* 3.3*  CL 89* 92*  CO2 22 25  GLUCOSE 120* 108*  BUN 10 9  CREATININE 1.35* 1.32*  CALCIUM 8.6* 8.6*   LFT Recent Labs    02/26/19 0557  PROT 5.3*  ALBUMIN 3.0*  AST 93*  ALT 56*  ALKPHOS 107  BILITOT 2.6*  BILIDIR 0.9*  IBILI 1.7*   STUDIES: Dg Chest 2 View  Result Date: 02/25/2019 CLINICAL DATA:  Shortness of breath. EXAM: CHEST - 2 VIEW COMPARISON:  August 11, 2018. FINDINGS: The heart size and mediastinal contours are within normal limits. Both lungs are clear. No pneumothorax or pleural effusion is noted. The visualized skeletal structures are unremarkable. IMPRESSION: No active cardiopulmonary disease. Electronically Signed   By: Marijo Conception M.D.   On: 02/25/2019 18:27   Ct Head Wo Contrast  Result Date: 02/25/2019 CLINICAL DATA:  Posttraumatic headache after multiple falls. EXAM: CT HEAD WITHOUT  CONTRAST TECHNIQUE: Contiguous axial images were obtained from the base of the skull through the vertex without intravenous contrast. COMPARISON:  August 11, 2018. FINDINGS: Brain: Mild  diffuse cortical atrophy is noted. Mild chronic ischemic white matter disease is noted. No mass effect or midline shift is noted. Ventricular size is within normal limits. There is no evidence of mass lesion, hemorrhage or acute infarction. Vascular: No hyperdense vessel or unexpected calcification. Skull: Normal. Negative for fracture or focal lesion. Sinuses/Orbits: No acute finding. Other: None. IMPRESSION: Mild diffuse cortical atrophy. Mild chronic ischemic white matter disease. No acute intracranial abnormality seen. Electronically Signed   By: Marijo Conception M.D.   On: 02/25/2019 18:42   US Abdomen Limited Ruq  Result Date: 02/25/2019 CLINICAL DATA:  Elevated LFTs, fatty liver EXAM: ULTRASOUND ABDOMEN LIMITED RIGHT UPPER QUADRANT COMPARISON:  None. FINDINGS: Gallbladder: Shadowing gallstones are present, the largest measuring 6 mm. No sonographic Murphy sign noted by sonographer. Common bile duct: Diameter: 4.3 mm Liver: No focal lesion identified. Increased echotexture seen throughout. No focal abnormality or biliary ductal dilatation. portal vein is patent on color Doppler imaging with normal direction of blood flow towards the liver. Other: None. IMPRESSION: Cholelithiasis no evidence of acute cholecystitis. Hepatic steatosis. Electronically Signed   By: Prudencio Pair M.D.   On: 02/25/2019 21:24    Impression / Plan:   Impression: 1.  Symptomatic iron deficiency anemia: FOBT positive in the ER, hemoglobin 6.9 at admission (8.0 in April 2020)-->1 u prbcs-->6.9, recent colonoscopy in April 2019 with two non-bleeding AVM's, large areas of ecchymosis from recent falls (also likely contributing to anemia); Consider GI source of blood loss vs other 2.  Chronic anticoagulation for A. fib: Eliquis is on hold as of last night 3.  Thrombocytopenia: Platelet count 117,000 4.  Elevated LFTs: AST 112, ALT 67, T bili 2.1, alk phos normal (LFTs normal 2 years ago), right upper quadrant ultrasound with  cholelithiasis no evidence of acute cholecystitis and hepatic steatosis, history of alcoholism currently in remission 5.  GERD: On a PPI 6. Hypokalemia: K 3.3 at admission  Plan: 1.  Patient would likely benefit from further work-up of his elevated LFTs.  We will add on an acute hepatitis panel for now. 2.  We will likely proceed with EGD during this admission given recent colonoscopy.  Did discuss risks, benefits, limitations and alternatives and patient agrees to proceed.  We will schedule this for tomorrow morning.  Patient can be back on a regular diet today and n.p.o. after midnight. 3.  Continue PPI 4.  Continue to hold Eliquis for now. 5.  Patient is awaiting another unit of PRBCs this morning.  This has already been ordered by the hospitalist.  Please ensure hemoglobin is above 7 by tomorrow in time for his EGD. 6. K supplementation has already been ordered by hospitalist team-need to ensure this normalizes before EGD in the am 7.  Please await any further recommendations from Dr. Ardis Hughs later today.  Thank you for your kind consultation, we will continue to follow.  Lavone Nian Lemmon  02/26/2019, 9:18 AM  ________________________________________________________________________  Velora Heckler GI MD note:  I personally examined the patient, reviewed the data and agree with the assessment and plan described above.  He has been on eliquis for about 5 years.  Now with symptomatic IDA without overt bleeding of any kind. 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  He has received  one unit PRBC already and another is to be transfused as well.  I am planning EGD tomorrow.  If no obvious etiology of his IDA, will consider next day colonoscopy to treat the previously noted cecal AVMs   Owens Loffler, MD [facility] Pager [phone]

## 2019-02-26 NOTE — Progress Notes (Signed)
  Echocardiogram 2D Echocardiogram has been performed.  Burnett Kanaris 02/26/2019, 8:43 AM

## 2019-02-26 NOTE — Consult Note (Addendum)
Consultation  Referring Provider:  Dr. Broadus John    Primary Care Physician:  Osborne Casco [name], MD Primary Gastroenterologist:  Dr. 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 male with a past medical history of AFib on Eliquis and other listed below, presented to the ER on 02/25/2023 evaluation of low hemoglobin, recent falls and bilateral lower extremity edema.    Today, the patient explains that he had blood work done by his primary care provider yesterday and was told that his hemoglobin was 6.0.  Apparently this was 11 about 8 months ago.  Does describe that he has had three occasions of losing his balance and had 3 falls within the past week.  Also describes worsening bilateral lower extremity edema for the past month with dyspnea on exertion for the past few months.  Other than this, the patient tells me that he feels fine.  He does have chronic reflux which is controlled on Protonix 40 mg daily.  Denies abdominal pain or change in bowel habits, he has not seen any blood in his stools bright red or black.    Last dose of Eliquis was yesterday morning 02/25/2019 around 9 AM.    Denies fever, chills, weight loss, anorexia, nausea, vomiting or symptoms that awaken him from sleep.  ED course: Oxygen saturation 80% on room air, FOBT positive, no melena seen on digital exam, hemoglobin 6.9, was 8 in April 2020, platelet count 117, potassium 3.4, AST 112, ALT 67 [place] total bili 2.1, chest x-ray with no active cardiopulmonary disease, 1 unit PRBCs and IV Lasix 20 mg was ordered.  GI history: 08/07/2017 colonoscopy by Dr. Carlean Purl: - One 8 mm polyp in the sigmoid colon, removed with a hot snare. Resected and retrieved. Clip (MR conditional) was placed. - One 5 mm polyp in the descending colon, removed with a cold snare. Resected and retrieved. - Diverticulosis in the left colon and in the right colon. - External and internal  hemorrhoids. - Two non-bleeding colonic angiodysplastic lesions. Tiny adjacent and in cecum - The examination was otherwise normal on direct and retroflexion views.  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   Atrial fibrillation ([place])    Fall at home 03/28/2016   GERD (gastroesophageal reflux disease)    H/O seasonal allergies    Hx of adenomatous colonic polyps 08/10/2017   Hyperlipidemia    Hypertension    Insomnia    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   OA (osteoarthritis)    hands    Past Surgical History:  Procedure Laterality Date   ANKLE SURGERY Left 2014   had rods put in    [place] Left 04/01/2016   Procedure: ARTHROPLASTY BIPOLAR HIP (HEMIARTHROPLASTY);  Surgeon: Paralee Cancel, MD;  Location: [facility];  Service: Orthopedics;  Laterality: Left;   HIP CLOSED REDUCTION Left 04/30/2016   Procedure: CLOSED REDUCTION HIP;  Surgeon: Wylene Simmer, MD;  Location: [facility];  Service: Orthopedics;  Laterality: Left;   TONSILLECTOMY      Family History  Problem Relation Age of Onset   Heart disease Mother 28   Hypertension Mother    Arthritis Mother    Heart failure Father 34   Stroke Maternal  Aunt    Heart failure Sister    Colon cancer Neg Hx    Esophageal cancer Neg Hx    Stomach cancer Neg Hx    Rectal cancer Neg Hx     Social History   Tobacco Use   Smoking status: Former Smoker   Smokeless tobacco: Never Used  Substance Use Topics   Alcohol use: No    Comment: 03/2016   i HAD ONE DRINK & THAT WAS THE FIRST DRINK i HAD IN A LONG TIME    Drug use: No    Prior to Admission medications   Medication Sig Start Date End Date Taking? Authorizing Provider  acetaminophen (TYLENOL) 500 MG tablet Take 1,000 mg by mouth every 6 (six) hours as needed for headache (pain).   Yes [provider]  apixaban (ELIQUIS) 5 MG TABS  tablet Take 1 tablet (5 mg total) by mouth 2 (two) times daily. 08/24/18  Yes Vann, Jessica U, DO  atorvastatin (LIPITOR) 20 MG tablet Take 20 mg by mouth daily.   Yes [provider]  escitalopram (LEXAPRO) 10 MG tablet Take 10 mg by mouth daily.   Yes [provider]  ferrous sulfate 325 (65 FE) MG EC tablet Take 325 mg by mouth daily.    Yes [provider]  folic acid (FOLVITE) 1 MG tablet Take 1 mg by mouth daily.   Yes [provider]  loratadine (CLARITIN) 10 MG tablet Take 10 mg by mouth daily.   Yes [provider]  metoprolol (TOPROL-XL) 200 MG 24 hr tablet Take 200 mg by mouth daily.   Yes [provider]  Multiple Vitamin (MULTIVITAMIN WITH MINERALS) TABS tablet Take 1 tablet by mouth daily.   Yes [provider]  pantoprazole (PROTONIX) 40 MG tablet Take 40 mg by mouth daily.   Yes [provider]  tamsulosin (FLOMAX) 0.4 MG CAPS capsule Take 0.4 mg by mouth.   Yes [provider]  Thiamine HCl (VITAMIN B-1) 250 MG tablet Take 250 mg by mouth daily.   Yes [provider]  traZODone (DESYREL) 50 MG tablet Take 50 mg by mouth at bedtime.   Yes [provider]    Current Facility-Administered Medications  Medication Dose Route Frequency Provider Last Rate Last Dose   0.9 %  sodium chloride infusion (Manually program via Guardrails IV Fluids)   Intravenous Once Domenic Polite, MD       acetaminophen (TYLENOL) tablet 1,000 mg  1,000 mg Oral Q6H PRN Shela Leff, MD       atorvastatin (LIPITOR) tablet 20 mg  20 mg Oral Daily Shela Leff, MD       escitalopram (LEXAPRO) tablet 10 mg  10 mg Oral Daily Shela Leff, MD       ferrous sulfate tablet 325 mg  325 mg Oral Daily Shela Leff, MD       folic acid (FOLVITE) tablet 1 mg  1 mg Oral Daily Shela Leff, MD       furosemide (LASIX) injection 20 mg  20 mg Intravenous BID Shela Leff, MD        loratadine (CLARITIN) tablet 10 mg  10 mg Oral Daily Shela Leff, MD       metoprolol succinate (TOPROL-XL) 24 hr tablet 200 mg  200 mg Oral Daily Shela Leff, MD       multivitamin with minerals tablet 1 tablet  1 tablet Oral Daily Shela Leff, MD       pantoprazole (PROTONIX) EC  tablet 40 mg  40 mg Oral Daily Shela Leff, MD       tamsulosin [facility]) capsule 0.4 mg  0.4 mg Oral Daily Shela Leff, MD   0.4 mg at 02/25/19 2250   thiamine (VITAMIN B-1) tablet 250 mg  250 mg Oral Daily Shela Leff, MD       traZODone (DESYREL) tablet 50 mg  50 mg Oral QHS Shela Leff, MD   50 mg at 02/25/19 2250    Allergies as of 02/25/2019 - Review Complete 02/25/2019  Allergen Reaction Noted   Penicillins Other (See Comments) 03/30/2016     Review of Systems:    Constitutional: No weight loss, fever or chills Skin: +bruising Cardiovascular: No chest pain   Respiratory: +DOE Gastrointestinal: See HPI and otherwise negative Genitourinary: No dysuria Neurological:+dizziness Musculoskeletal: No new muscle or joint pain Hematologic: +bruising Psychiatric: No history of depression or anxiety    Physical Exam:  Vital signs in last 24 hours: Temp:  [98.3 F (36.8 C)-98.9 F (37.2 C)] 98.9 F (37.2 C) (10/20 0500) Pulse Rate:  [75-86] 77 (10/19 2233) Resp:  [14-29] 20 (10/20 0500) BP: (119-137)/(52-71) 130/57 (10/20 0500) SpO2:  [93 %-100 %] 99 % (10/20 0500) Weight:  [108.9 kg-119.6 kg] 119.6 kg (10/20 0500) Last BM Date: 02/25/19 General:   Pleasant Caucasian male appears to be in NAD, Well developed, Well nourished, alert and cooperative Head:  Normocephalic and atraumatic. Eyes:   PEERL, EOMI. No icterus. Conjunctiva pink. Ears:  Normal auditory acuity. Neck:  Supple Throat: Oral cavity and pharynx without inflammation, swelling or lesion.  Lungs: Respirations even and unlabored. Lungs clear to auscultation bilaterally.   No wheezes,  crackles, or rhonchi.  Heart: Normal S1, S2. No MRG. Regular rate and rhythm. + 3 to + 4 pitting edema bilateral lower extremities Abdomen:  Soft, nondistended, nontender. No rebound or guarding. Normal bowel sounds. No appreciable masses or hepatomegaly. Rectal:  Not performed.  Msk:  Symmetrical without gross deformities. Peripheral pulses intact.  Extremities: No deformities + Large areas of ecchymosis of left upper extremity and right lower extremity, abrasions on both knees Neurologic:  Alert and  oriented x4;  grossly normal neurologically.  Skin:   Dry and intact without significant lesions or rashes. Psychiatric: Demonstrates good judgement and reason without abnormal affect or behaviors.  LAB RESULTS: Recent Labs    02/25/19 1729 02/26/19 0557  WBC 8.0 4.9  HGB 6.9* 6.9*  HCT 19.7* 20.4*  PLT 117* 101*   BMET Recent Labs    02/25/19 1729 02/26/19 0557  NA 124* 129*  K 3.4* 3.3*  CL 89* 92*  CO2 22 25  GLUCOSE 120* 108*  BUN 10 9  CREATININE 1.35* 1.32*  CALCIUM 8.6* 8.6*   LFT Recent Labs    02/26/19 0557  PROT 5.3*  ALBUMIN 3.0*  AST 93*  ALT 56*  ALKPHOS 107  BILITOT 2.6*  BILIDIR 0.9*  IBILI 1.7*   STUDIES: Dg Chest 2 View  Result Date: 02/25/2019 CLINICAL DATA:  Shortness of breath. EXAM: CHEST - 2 VIEW COMPARISON:  August 11, 2018. FINDINGS: The heart size and mediastinal contours are within normal limits. Both lungs are clear. No pneumothorax or pleural effusion is noted. The visualized skeletal structures are unremarkable. IMPRESSION: No active cardiopulmonary disease. Electronically Signed   By: Marijo Conception M.D.   On: 02/25/2019 18:27   Ct Head Wo Contrast  Result Date: 02/25/2019 CLINICAL DATA:  Posttraumatic headache after multiple falls. EXAM: CT HEAD WITHOUT  CONTRAST TECHNIQUE: Contiguous axial images were obtained from the base of the skull through the vertex without intravenous contrast. COMPARISON:  August 11, 2018. FINDINGS: Brain: Mild  diffuse cortical atrophy is noted. Mild chronic ischemic white matter disease is noted. No mass effect or midline shift is noted. Ventricular size is within normal limits. There is no evidence of mass lesion, hemorrhage or acute infarction. Vascular: No hyperdense vessel or unexpected calcification. Skull: Normal. Negative for fracture or focal lesion. Sinuses/Orbits: No acute finding. Other: None. IMPRESSION: Mild diffuse cortical atrophy. Mild chronic ischemic white matter disease. No acute intracranial abnormality seen. Electronically Signed   By: Marijo Conception M.D.   On: 02/25/2019 18:42   US Abdomen Limited Ruq  Result Date: 02/25/2019 CLINICAL DATA:  Elevated LFTs, fatty liver EXAM: ULTRASOUND ABDOMEN LIMITED RIGHT UPPER QUADRANT COMPARISON:  None. FINDINGS: Gallbladder: Shadowing gallstones are present, the largest measuring 6 mm. No sonographic Murphy sign noted by sonographer. Common bile duct: Diameter: 4.3 mm Liver: No focal lesion identified. Increased echotexture seen throughout. No focal abnormality or biliary ductal dilatation. portal vein is patent on color Doppler imaging with normal direction of blood flow towards the liver. Other: None. IMPRESSION: Cholelithiasis no evidence of acute cholecystitis. Hepatic steatosis. Electronically Signed   By: Prudencio Pair M.D.   On: 02/25/2019 21:24    Impression / Plan:   Impression: 1.  Symptomatic iron deficiency anemia: FOBT positive in the ER, hemoglobin 6.9 at admission (8.0 in April 2020)-->1 u prbcs-->6.9, recent colonoscopy in April 2019 with two non-bleeding AVM's, large areas of ecchymosis from recent falls (also likely contributing to anemia); Consider GI source of blood loss vs other 2.  Chronic anticoagulation for A. fib: Eliquis is on hold as of last night 3.  Thrombocytopenia: Platelet count 117,000 4.  Elevated LFTs: AST 112, ALT 67, T bili 2.1, alk phos normal (LFTs normal 2 years ago), right upper quadrant ultrasound with  cholelithiasis no evidence of acute cholecystitis and hepatic steatosis, history of alcoholism currently in remission 5.  GERD: On a PPI 6. Hypokalemia: K 3.3 at admission  Plan: 1.  Patient would likely benefit from further work-up of his elevated LFTs.  We will add on an acute hepatitis panel for now. 2.  We will likely proceed with EGD during this admission given recent colonoscopy.  Did discuss risks, benefits, limitations and alternatives and patient agrees to proceed.  We will schedule this for tomorrow morning.  Patient can be back on a regular diet today and n.p.o. after midnight. 3.  Continue PPI 4.  Continue to hold Eliquis for now. 5.  Patient is awaiting another unit of PRBCs this morning.  This has already been ordered by the hospitalist.  Please ensure hemoglobin is above 7 by tomorrow in time for his EGD. 6. K supplementation has already been ordered by hospitalist team-need to ensure this normalizes before EGD in the am 7.  Please await any further recommendations from Dr. Ardis Hughs later today.  Thank you for your kind consultation, we will continue to follow.  Lavone Nian Lemmon  02/26/2019, 9:18 AM  ________________________________________________________________________  Velora Heckler GI MD note:  I personally examined the patient, reviewed the data and agree with the assessment and plan described above.  He has been on eliquis for about 5 years.  Now with symptomatic IDA without overt bleeding of any kind. 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  He has received  one unit PRBC already and another is to be transfused as well.  I am planning EGD tomorrow.  If no obvious etiology of his IDA, will consider next day colonoscopy to treat the previously noted cecal AVMs   Owens Loffler, MD [facility] Gastroenterology Pager [phone]

## 2019-02-26 NOTE — Progress Notes (Signed)
PROGRESS NOTE    Darryl Diaz  JEH:[id number] DOB: 1941/02/21 DOA: 02/25/2019 PCP: [name], MD  Brief Narrative: Darryl Diaz is a 78 y.o. male with medical history significant of paroxysmal atrial fibrillation on Eliquis, GERD, history of colon polyps, hypertension, hyperlipidemia, insomnia, depression, osteoarthritis presenting to the ED for evaluation of low hemoglobin, recent falls, and bilateral lower extremity edema.  Patient states he had blood work done by his PCP and was told his hemoglobin dropped to 6.0 from baseline of 11.0, 8 months ago -Also reported that he lost his balance and had 3 falls last week. -And history of worsening bilateral lower extremity edema, dyspnea on exertion for the past 1 month. -In the emergency room he was found to be hypoxic to the 80s on room air, noted to have extensive bruising in left arm right lower leg, lower extremities swelling, hemoglobin was 6.9, digital rectal exam was positive, his Eliquis was held, x-ray was unremarkable   Assessment & Plan:   Normocytic anemia, heme positive stools -Suspect this is multifactorial, has extensive bruising in his left arm and forearm, right lower leg which could be accounting for some of his blood loss in addition he is clinically volume overloaded which also causes a dilutional component, however since he is on anticoagulation with Eliquis with positive Hemoccult GI work-up is appropriate -Gastroenterology consulting, plan for endoscopy tomorrow -Hemoglobin 6.9 after 1 unit of blood given yesterday we will transfuse another unit of PRBC today -Eliquis on hold, history of colonoscopy last year with AVMs as well which could account for slow intermittent blood loss  Acute CHF -Has dyspnea on exertion orthopnea and lower extremity edema could be high output heart failure from severe anemia, also need to rule out cardiomyopathy, diastolic CHF -Continue IV Lasix today -Check 2D echocardiogram   Paroxysmal atrial fibrillation -Continue to hold Eliquis given worsening anemia, recurrent falls and suspected GI bleed -Continue metoprolol -PT  eval to determine gait, if gait is very unsteady may need to reconsider restarting Eliquis given frequent falls  Hyponatremia -Due to volume overloaded state, improving with diuresis, monitor  Frequent falls -CT head unremarkable, multiple large ecchymotic areas -Follow-up B12 -Physical therapy consultation  Mild thrombocytopenia -Monitor  Mildly elevated LFTs -Abdominal exam is benign, could be from fatty liver disease or passive congestion from volume overloaded state, monitor  DVT prophylaxis: SCDs Code Status: Full code Family Communication: No family at bedside, patient requested that we do not call his daughter for updates Disposition Plan: To be determined  Consultants:   Gastroenterology   Procedures:   Antimicrobials:    Subjective: -Feels weak but is a little better from yesterday, reports dyspnea on exertion ongoing for 1 month with edema, denies any overt bleeding or blood loss -3 falls in the last week  Objective: Vitals:   02/26/19 0500 02/26/19 1151 02/26/19 1200 02/26/19 1244  BP: (!) 130/57 129/89 132/88 (!) 89/33  Pulse:  99 99 88  Resp: 20 20 20    Temp: 98.9 F (37.2 C) 99.6 F (37.6 C) 98.9 F (37.2 C) 99.6 F (37.6 C)  TempSrc: Oral Axillary Axillary Axillary  SpO2: 99% 100% 100%   Weight: 119.6 kg     Height:        Intake/Output Summary (Last 24 hours) at 02/26/2019 1247 Last data filed at 02/26/2019 1151 Gross per 24 hour  Intake 821.17 ml  Output -  Net 821.17 ml   Filed Weights   02/25/19 1727 02/26/19 0500  Weight: 108.9 kg  119.6 kg    Examination:  General exam: Obese chronically ill male sitting up in bed, AAO x3 Respiratory system: Bibasilar crackles Cardiovascular system: S1 & S2 heard, RRR.  Gastrointestinal system: Soft obese nontender nondistended Central nervous  system: Alert and oriented. No focal neurological deficits. Extremities: 2+ edema, severe ecchymosis, large areas involving his left upper extremity, right lower extremity, right flank Skin: No rashes, lesions or ulcers Psychiatry: Judgement and insight appear normal. Mood & affect appropriate.     Data Reviewed:   CBC: Recent Labs  Lab 02/25/19 1729 02/26/19 0557  WBC 8.0 4.9  NEUTROABS 5.9  --   HGB 6.9* 6.9*  HCT 19.7* 20.4*  MCV 97.5 94.9  PLT 117* 295*   Basic Metabolic Panel: Recent Labs  Lab 02/25/19 1729 02/25/19 1758 02/26/19 0557  NA 124*  --  129*  K 3.4*  --  3.3*  CL 89*  --  92*  CO2 22  --  25  GLUCOSE 120*  --  108*  BUN 10  --  9  CREATININE 1.35*  --  1.32*  CALCIUM 8.6*  --  8.6*  MG  --  1.2*  --    GFR: Estimated Creatinine Clearance: 62.6 mL/min (A) (by C-G formula based on SCr of 1.32 mg/dL (H)). Liver Function Tests: Recent Labs  Lab 02/25/19 1729 02/26/19 0557  AST 112* 93*  ALT 67* 56*  ALKPHOS 120 107  BILITOT 2.1* 2.6*  PROT 5.5* 5.3*  ALBUMIN 3.4* 3.0*   No results for input(s): LIPASE, AMYLASE in the last 168 hours. No results for input(s): AMMONIA in the last 168 hours. Coagulation Profile: No results for input(s): INR, PROTIME in the last 168 hours. Cardiac Enzymes: No results for input(s): CKTOTAL, CKMB, CKMBINDEX, TROPONINI in the last 168 hours. BNP (last 3 results) No results for input(s): PROBNP in the last 8760 hours. HbA1C: No results for input(s): HGBA1C in the last 72 hours. CBG: No results for input(s): GLUCAP in the last 168 hours. Lipid Profile: No results for input(s): CHOL, HDL, LDLCALC, TRIG, CHOLHDL, LDLDIRECT in the last 72 hours. Thyroid Function Tests: No results for input(s): TSH, T4TOTAL, FREET4, T3FREE, THYROIDAB in the last 72 hours. Anemia Panel: Recent Labs    02/26/19 0557  VITAMINB12 720  FOLATE 22.4  FERRITIN 349*  TIBC 218*  IRON 45  RETICCTPCT 5.0*   Urine analysis:     Component Value Date/Time   COLORURINE YELLOW 08/11/2018 1119   APPEARANCEUR CLEAR 08/11/2018 1119   LABSPEC 1.005 08/11/2018 1119   PHURINE 6.0 08/11/2018 1119   GLUCOSEU NEGATIVE 08/11/2018 1119   HGBUR SMALL (A) 08/11/2018 1119   BILIRUBINUR NEGATIVE 08/11/2018 1119   KETONESUR NEGATIVE 08/11/2018 1119   PROTEINUR NEGATIVE 08/11/2018 1119   NITRITE NEGATIVE 08/11/2018 1119   LEUKOCYTESUR NEGATIVE 08/11/2018 1119   Sepsis Labs: @LABRCNTIP (procalcitonin:4,lacticidven:4)  ) Recent Results (from the past 240 hour(s))  SARS CORONAVIRUS 2 (TAT 6-24 HRS) Nasopharyngeal Nasopharyngeal Swab     Status: None   Collection Time: 02/25/19  7:18 PM   Specimen: Nasopharyngeal Swab  Result Value Ref Range Status   SARS Coronavirus 2 NEGATIVE NEGATIVE Final    Comment: (NOTE) SARS-CoV-2 target nucleic acids are NOT DETECTED. The SARS-CoV-2 RNA is generally detectable in upper and lower respiratory specimens during the acute phase of infection. Negative results do not preclude SARS-CoV-2 infection, do not rule out co-infections with other pathogens, and should not be used as the sole basis for treatment or other patient  management decisions. Negative results must be combined with clinical observations, patient history, and epidemiological information. The expected result is Negative. Fact Sheet for Patients: SugarRoll.be Fact Sheet for Healthcare Providers: https://www.woods-mathews.com/ This test is not yet approved or cleared by the [place] FDA and  has been authorized for detection and/or diagnosis of SARS-CoV-2 by FDA under an Emergency Use Authorization (EUA). This EUA will remain  in effect (meaning this test can be used) for the duration of the COVID-19 declaration under Section 56 4(b)(1) of the Act, 21 U.S.C. section 360bbb-3(b)(1), unless the authorization is terminated or revoked sooner. Performed at [facility], [place] [address] [place], [place] [address]          Radiology Studies: Dg Chest 2 View  Result Date: 02/25/2019 CLINICAL DATA:  Shortness of breath. EXAM: CHEST - 2 VIEW COMPARISON:  August 11, 2018. FINDINGS: The heart size and mediastinal contours are within normal limits. Both lungs are clear. No pneumothorax or pleural effusion is noted. The visualized skeletal structures are unremarkable. IMPRESSION: No active cardiopulmonary disease. Electronically Signed   By: Marijo Conception M.D.   On: 02/25/2019 18:27   Ct Head Wo Contrast  Result Date: 02/25/2019 CLINICAL DATA:  Posttraumatic headache after multiple falls. EXAM: CT HEAD WITHOUT CONTRAST TECHNIQUE: Contiguous axial images were obtained from the base of the skull through the vertex without intravenous contrast. COMPARISON:  August 11, 2018. FINDINGS: Brain: Mild diffuse cortical atrophy is noted. Mild chronic ischemic white matter disease is noted. No mass effect or midline shift is noted. Ventricular size is within normal limits. There is no evidence of mass lesion, hemorrhage or acute infarction. Vascular: No hyperdense vessel or unexpected calcification. Skull: Normal. Negative for fracture or focal lesion. Sinuses/Orbits: No acute finding. Other: None. IMPRESSION: Mild diffuse cortical atrophy. Mild chronic ischemic white matter disease. No acute intracranial abnormality seen. Electronically Signed   By: Marijo Conception M.D.   On: 02/25/2019 18:42   US Abdomen Limited Ruq  Result Date: 02/25/2019 CLINICAL DATA:  Elevated LFTs, fatty liver EXAM: ULTRASOUND ABDOMEN LIMITED RIGHT UPPER QUADRANT COMPARISON:  None. FINDINGS: Gallbladder: Shadowing gallstones are present, the largest measuring 6 mm. No sonographic Murphy sign noted by sonographer. Common bile duct: Diameter: 4.3 mm Liver: No focal lesion identified. Increased echotexture seen throughout. No focal abnormality or biliary ductal dilatation. portal vein is patent on color Doppler imaging  with normal direction of blood flow towards the liver. Other: None. IMPRESSION: Cholelithiasis no evidence of acute cholecystitis. Hepatic steatosis. Electronically Signed   By: Prudencio Pair M.D.   On: 02/25/2019 21:24        Scheduled Meds: . atorvastatin  20 mg Oral Daily  . escitalopram  10 mg Oral Daily  . ferrous sulfate  325 mg Oral Daily  . folic acid  1 mg Oral Daily  . furosemide  20 mg Intravenous BID  . loratadine  10 mg Oral Daily  . metoprolol  200 mg Oral Daily  . multivitamin with minerals  1 tablet Oral Daily  . pantoprazole  40 mg Oral Daily  . silver sulfADIAZINE   Topical BID  . tamsulosin  0.4 mg Oral Daily  . vitamin B-1  250 mg Oral Daily  . traZODone  50 mg Oral QHS   Continuous Infusions:   LOS: 0 days    Time spent: 57min    Domenic Polite, MD [facility]  02/26/2019, 12:47 PM

## 2019-02-26 NOTE — Consult Note (Signed)
[facility] Nurse wound consult note Patient receiving care in [place].  Consult completed remotely after review of record and photos Reason for Consult: Left upper extremity wound Wound type: injury from fall Measurement: To be provided by the bedside RN in the flowsheet section Wound bed: dark purple Drainage (amount, consistency, odor) none Periwound: severe bruising along entire left arm/hand Dressing procedure/placement/frequency: Silvadene. Apply to left posterior arm wound. Cover with Telfa. Secure with kerlex. Monitor the wound area(s) for worsening of condition such as: Signs/symptoms of infection,  Increase in size,  Development of or worsening of odor, Development of pain, or increased pain at the affected locations.  Notify the medical team if any of these develop.  Thank you for the consult. [facility] nurse will not follow at this time.  Please re-consult the [facility] team if needed.  [name], RN, MSN, CWOCN, CNS-BC, pager [phone]

## 2019-02-26 NOTE — Plan of Care (Signed)

## 2019-02-27 ENCOUNTER — Inpatient Hospital Stay (HOSPITAL_COMMUNITY): Payer: Medicare Other | Admitting: Anesthesiology

## 2019-02-27 ENCOUNTER — Encounter (HOSPITAL_COMMUNITY): Payer: Self-pay | Admitting: *Deleted

## 2019-02-27 ENCOUNTER — Encounter (HOSPITAL_COMMUNITY)
Admission: EM | Disposition: A | Discharge status: Skilled Nursing Facility | Payer: Self-pay | Source: Home / Self Care | Attending: Internal Medicine

## 2019-02-27 DIAGNOSIS — I4891 Unspecified atrial fibrillation: Secondary | ICD-10-CM

## 2019-02-27 DIAGNOSIS — N1831 Chronic kidney disease, stage 3a: Secondary | ICD-10-CM

## 2019-02-27 DIAGNOSIS — K922 Gastrointestinal hemorrhage, unspecified: Secondary | ICD-10-CM

## 2019-02-27 DIAGNOSIS — R600 Localized edema: Secondary | ICD-10-CM

## 2019-02-27 DIAGNOSIS — D649 Anemia, unspecified: Secondary | ICD-10-CM

## 2019-02-27 LAB — MAGNESIUM: Magnesium: 1.2 mg/dL — ABNORMAL LOW (ref 1.7–2.4)

## 2019-02-27 LAB — CBC
HCT: 25.1 % — ABNORMAL LOW (ref 39.0–52.0)
Hemoglobin: 8.6 g/dL — ABNORMAL LOW (ref 13.0–17.0)
MCH: 32.6 pg (ref 26.0–34.0)
MCHC: 34.3 g/dL (ref 30.0–36.0)
MCV: 95.1 fL (ref 80.0–100.0)
Platelets: 116 10*3/uL — ABNORMAL LOW (ref 150–400)
RBC: 2.64 MIL/uL — ABNORMAL LOW (ref 4.22–5.81)
RDW: 17.8 % — ABNORMAL HIGH (ref 11.5–15.5)
WBC: 6.6 10*3/uL (ref 4.0–10.5)
nRBC: 0.5 % — ABNORMAL HIGH (ref 0.0–0.2)

## 2019-02-27 LAB — TSH: TSH: 1.737 u[IU]/mL (ref 0.350–4.500)

## 2019-02-27 LAB — BASIC METABOLIC PANEL
Anion gap: 12 (ref 5–15)
BUN: 8 mg/dL (ref 8–23)
CO2: 25 mmol/L (ref 22–32)
Calcium: 8.4 mg/dL — ABNORMAL LOW (ref 8.9–10.3)
Chloride: 96 mmol/L — ABNORMAL LOW (ref 98–111)
Creatinine, Ser: 1.31 mg/dL — ABNORMAL HIGH (ref 0.61–1.24)
GFR calc Af Amer: >60 mL/min (ref >60)
GFR calc non Af Amer: 52 mL/min — ABNORMAL LOW (ref >60)
Glucose, Bld: 114 mg/dL — ABNORMAL HIGH (ref 70–99)
Potassium: 3.5 mmol/L (ref 3.5–5.1)
Sodium: 133 mmol/L — ABNORMAL LOW (ref 135–145)

## 2019-02-27 LAB — BPAM RBC
Blood Product Expiration Date: 202011212359
Blood Product Expiration Date: 202011252359
ISSUE DATE / TIME: 202010191959
ISSUE DATE / TIME: 202010201207
Unit Type and Rh: 5100
Unit Type and Rh: 5100

## 2019-02-27 LAB — TYPE AND SCREEN
ABO/RH(D): O POS
Antibody Screen: NEGATIVE
Unit division: 0
Unit division: 0

## 2019-02-27 SURGERY — CANCELLED PROCEDURE

## 2019-02-27 MED ORDER — AMIODARONE HCL IN DEXTROSE 360-4.14 MG/200ML-% IV SOLN
30.0000 mg/h | INTRAVENOUS | Status: DC
  Administered 2019-02-27 – 2019-03-02 (×6): 30 mg/h via INTRAVENOUS
  Filled 2019-02-27 (×8): qty 200

## 2019-02-27 MED ORDER — METOPROLOL TARTRATE 5 MG/5ML IV SOLN
INTRAVENOUS | Status: DC | PRN
  Administered 2019-02-27 (×2): 5 mg via INTRAVENOUS

## 2019-02-27 MED ORDER — AMIODARONE LOAD VIA INFUSION
150.0000 mg | Freq: Once | INTRAVENOUS | Status: AC
  Administered 2019-02-27: 150 mg via INTRAVENOUS
  Filled 2019-02-27: qty 83.34

## 2019-02-27 MED ORDER — LACTATED RINGERS IV SOLN
INTRAVENOUS | Status: DC
  Administered 2019-02-27: 13:00:00 via INTRAVENOUS

## 2019-02-27 MED ORDER — POTASSIUM CHLORIDE CRYS ER 20 MEQ PO TBCR
40.0000 meq | EXTENDED_RELEASE_TABLET | Freq: Once | ORAL | Status: AC
  Administered 2019-02-27: 40 meq via ORAL
  Filled 2019-02-27: qty 2

## 2019-02-27 MED ORDER — DILTIAZEM LOAD VIA INFUSION
10.0000 mg | Freq: Once | INTRAVENOUS | Status: DC
  Filled 2019-02-27: qty 10

## 2019-02-27 MED ORDER — DILTIAZEM HCL-DEXTROSE 125-5 MG/125ML-% IV SOLN (PREMIX)
5.0000 mg/h | INTRAVENOUS | Status: DC
  Administered 2019-02-27: 5 mg/h via INTRAVENOUS
  Administered 2019-02-28: 15 mg/h via INTRAVENOUS
  Administered 2019-02-28: 10 mg/h via INTRAVENOUS
  Administered 2019-03-01: 15 mg/h via INTRAVENOUS
  Filled 2019-02-27 (×6): qty 125

## 2019-02-27 MED ORDER — AMIODARONE HCL IN DEXTROSE 360-4.14 MG/200ML-% IV SOLN
60.0000 mg/h | INTRAVENOUS | Status: DC
  Administered 2019-02-27 (×2): 60 mg/h via INTRAVENOUS
  Filled 2019-02-27 (×2): qty 200

## 2019-02-27 MED ORDER — MAGNESIUM SULFATE 2 GM/50ML IV SOLN
2.0000 g | Freq: Once | INTRAVENOUS | Status: AC
  Administered 2019-02-27: 2 g via INTRAVENOUS
  Filled 2019-02-27: qty 50

## 2019-02-27 MED ORDER — DILTIAZEM HCL 25 MG/5ML IV SOLN
10.0000 mg | Freq: Once | INTRAVENOUS | Status: AC
  Administered 2019-02-27: 10 mg via INTRAVENOUS

## 2019-02-27 MED ORDER — MAGNESIUM OXIDE 400 (241.3 MG) MG PO TABS
400.0000 mg | ORAL_TABLET | Freq: Two times a day (BID) | ORAL | Status: DC
  Administered 2019-02-27 – 2019-03-05 (×12): 400 mg via ORAL
  Filled 2019-02-27 (×12): qty 1

## 2019-02-27 MED ORDER — DILTIAZEM HCL 25 MG/5ML IV SOLN
10.0000 mg | Freq: Once | INTRAVENOUS | Status: AC
  Administered 2019-02-27: 10 mg via INTRAVENOUS
  Filled 2019-02-27: qty 5

## 2019-02-27 SURGICAL SUPPLY — 15 items

## 2019-02-27 NOTE — Anesthesia Preprocedure Evaluation (Signed)
Anesthesia Evaluation  Patient identified by MRN, date of birth, ID band Patient awake    Reviewed: Allergy & Precautions, NPO status , Patient's Chart, lab work & pertinent test results  History of Anesthesia Complications Negative for: history of anesthetic complications  Airway Mallampati: III  TM Distance: >3 FB Neck ROM: Full    Dental   Pulmonary neg pulmonary ROS, former smoker,    Pulmonary exam normal        Cardiovascular hypertension, Pt. on medications and Pt. on home beta blockers + dysrhythmias Atrial Fibrillation  Rhythm:Irregular Rate:Tachycardia     Neuro/Psych PSYCHIATRIC DISORDERS Depression negative neurological ROS     GI/Hepatic GERD  ,(+)     substance abuse  alcohol use, Hepatitis -  Endo/Other  negative endocrine ROS  Renal/GU Renal InsufficiencyRenal disease  negative genitourinary   Musculoskeletal negative musculoskeletal ROS (+)   Abdominal   Peds  Hematology  (+) anemia ,   Anesthesia Other Findings   Reproductive/Obstetrics                             Anesthesia Physical Anesthesia Plan  ASA: III  Anesthesia Plan: MAC   Post-op Pain Management:    Induction: Intravenous  PONV Risk Score and Plan: 1 and Propofol infusion, TIVA and Treatment may vary due to age or medical condition  Airway Management Planned: Natural Airway, Nasal Cannula and Simple Face Mask  Additional Equipment: None  Intra-op Plan:   Post-operative Plan:   Informed Consent: I have reviewed the patients History and Physical, chart, labs and discussed the procedure including the risks, benefits and alternatives for the proposed anesthesia with the patient or authorized representative who has indicated his/her understanding and acceptance.       Plan Discussed with:   Anesthesia Plan Comments:         Anesthesia Quick Evaluation

## 2019-02-27 NOTE — Progress Notes (Signed)
OT Cancellation Note  Patient Details Name: Abisai Deer MRN: [id number] DOB: 29-Dec-1940   Cancelled Treatment:    Reason Eval/Treat Not Completed: Patient at procedure or test/ unavailable (egd)  Jeri Modena 02/27/2019, 1:00 PM   Jeri Modena, OTR/L  Acute Rehabilitation Services Pager: [phone] Office: [phone] .

## 2019-02-27 NOTE — Progress Notes (Signed)
Pt in pre-procedure for EGD.  Rhythm changed into rapid afib rate 120s-140s.  Pt asymptomatic.  5mg  IV lopressor given x2 with no decrease in rate.  Procedure cancelled for today, to be rescheduled.  Report given to bedside RN and pt returned to [facility].    Vista Lawman, RN

## 2019-02-27 NOTE — Progress Notes (Addendum)
PROGRESS NOTE    Darryl Diaz  XTK:[id number] DOB: 12/24/1940 DOA: 02/25/2019 PCP: [name], MD   Brief Narrative: 66yom w PAF on eliquis ,depression,GERD, history of colon polyps, hypertension, hyperlipidemia, insomnia, depression, osteoarthritis presenting to the ED for evaluation oflow hemoglobin, recent falls, and bilateral lower extremity edema blood work was done by PCP and was told his hemoglobin dropped to 6.0 from baseline of 11.0, 8 months ago. He also reported that he lost his balance and had 3 falls last week. And has worsening bilateral lower extremity edema, dyspnea on exertion for the past 1 month. In ER,hypoxic to the 80s on RA, with extensive bruising in left arm right lower leg, lower extremities swelling, hemoglobin was 6.9, digital rectal exam was positive-Eliquis was held, x-ray was unremarkab. GI  Consulted. He is s/p 2 unit PRBC, on lasix iv.   Subjective:  Reprots leg swelling improving and feeling better, hb is up AT 8.6, FOR egd TODAY Denies any chest pain, fever.  Assessment & Plan:  Acute blood loss anemia with hem + stool- suspect multifactorial with gi bleed, dilutional from edema, blood loss from burising in extremities in the setting on eliquis- on hld for now, s/p 2 units, hb stable increased appropriately. For EGD today. Cont ppi, monitor cbc in am. 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 ON PPI 40MG  DAILY.  Symptomatic anemia-s/p 2 unit prbc as above.  Falls at home- likely worsened by anemia/leg edema. Pt/to eval to continue and advised SNF. Ct head on admission neg.b12 stable.  Bilateral lower extremity edema w DOE/SOB, suspecting acute CHF W preserved lvef- lvef no in echo. Cont on on IV LASIX.   Hyponatremia: from fluid overload- improving on lasix. Cont diuresis and monitor bmp Recent Labs  Lab 02/25/19 1729 02/26/19 0557 02/27/19 0443  NA 124* 129* 133*   PAF- in NSR. eliquis on hold for now, cont metoprolol.he is going to  see his cardio to d/w abt eliquis- he reports he was at "[facility] for el[facility]is but they started anyway"  CKD stage III a-creat stable at 1.3. monitor Recent Labs  Lab 02/25/19 1729 02/26/19 0557 02/27/19 0443  BUN 10 9 8   CREATININE 1.35* 1.32* 1.31*    Mild thrombocytopenia-stable,monitor  Mild transaminitis: ast/alt/alp/tb- 93/56/107/2.6.has alcohol abuse history, I wonder if he had liver cirrhosis also contributing to his leg edema. Ct abdomen in 08/2018 showed hepatic steatosis.  Addendum Pt in a fib w rvr when he went down for EGD- procedure cancelled. Brought ot room, cardizem 10 mg iv x2, starting cardizem gtt and transfer to PCU, cardio consulte Body mass index is 34.32 kg/m.    DVT prophylaxis: SCD Code Status: full Family Communication: plan of care discussed with patient in detail.  Disposition Plan: Remains inpatient pending further wokr up for anemia, egd and improvement on leg swellinG with iv lasix. Will need SNF per PT   Consultants: gi  Procedures:  TTE 1. Left ventricular ejection fraction, by visual estimation, is 60 to 65%.The left ventricle has normal function. Normal left ventricular size. There is no left ventricular hypertrophy. 2. Global right ventricle has normal systolic function.The right ventricular size is normal. No increase in right ventricular wall thickness. 3. Left atrial size was mildly dilated. 4. Right atrial size was normal. 5. Mild mitral annular calcification. 6. Mild thickening of the anterior and posterior mitral valve leaflet(s). 7. Mild to moderate aortic valve annular calcification. 8. The mitral valve is normal in structure. No evidence of mitral valve regurgitation. No  evidence of mitral stenosis. 9. The tricuspid valve is normal in structure. Tricuspid valve regurgitation is trivial. 10. The aortic valve is tricuspid Aortic valve regurgitation was not visualized by color flow Doppler. Mild to moderate aortic valve  sclerosis/calcification without any evidence of aortic stenosis. 11. The pulmonic valve was normal in structure. Pulmonic valve regurgitation is trivial by color flow Doppler. 12. Aortic dilatation noted. 13. There is mild dilatation of the aortic root measuring 38 mm. 14. Moderately elevated pulmonary artery systolic pressure. 15. The inferior vena cava is normal in size with greater than 50% respiratory variability, suggesting right atrial pressure of 3 mmHg.  Microbiology:  Antimicrobials: Anti-infectives (From admission, onward)   None       Objective: Vitals:   02/26/19 1438 02/26/19 2257 02/27/19 0152 02/27/19 0433  BP: (!) 106/50 (!) 107/56  (!) 148/63  Pulse: 85 83  82  Resp: 18   18  Temp: 98.9 F (37.2 C) 98.9 F (37.2 C)  98.6 F (37 C)  TempSrc: Oral Oral  Oral  SpO2: 99% 98%  95%  Weight:   114.8 kg   Height:        Intake/Output Summary (Last 24 hours) at 02/27/2019 0945 Last data filed at 02/26/2019 2300 Gross per 24 hour  Intake 450 ml  Output 2100 ml  Net -1650 ml   Filed Weights   02/25/19 1727 02/26/19 0500 02/27/19 0152  Weight: 108.9 kg 119.6 kg 114.8 kg   Weight change: 5.937 kg  Body mass index is 34.32 kg/m.  Intake/Output from previous day: 10/20 0701 - 10/21 0700 In: 450 [P.O.:200; I.V.:250] Out: 2100 [Urine:2100] Intake/Output this shift: No intake/output data recorded.  Examination:  General exam: Appears calm and comfortable,NAD. HEENT:PERRL,Oral mucosa moist, Ear/Nose normal on gross exam Respiratory system: Bilateral equal air entry, normal vesicular breath sounds, no wheezes or crackles  Cardiovascular system: S1 & S2 heard,No JVD, murmurs. Gastrointestinal system: Abdomen is  soft, non tender, non distended, BS +  Nervous System:Alert and oriented. No focal neurological deficits/moving extremities, sensation intact. Extremities:  B/l pitting edema+, no clubbing, distal peripheral pulses palpable. Skin: No rashes, lesions,  no icterus.SEVERE ECCHYMOSES INVOLVING LUE, RLE, RT FLANK. MSK: Normal muscle bulk,tone ,power  Medications:  Scheduled Meds: . atorvastatin  20 mg Oral Daily  . escitalopram  10 mg Oral Daily  . ferrous sulfate  325 mg Oral Daily  . folic acid  1 mg Oral Daily  . furosemide  20 mg Intravenous BID  . loratadine  10 mg Oral Daily  . metoprolol  200 mg Oral Daily  . miconazole nitrate   Topical TID  . multivitamin with minerals  1 tablet Oral Daily  . pantoprazole  40 mg Oral Daily  . silver sulfADIAZINE   Topical BID  . tamsulosin  0.4 mg Oral Daily  . vitamin B-1  250 mg Oral Daily  . traZODone  50 mg Oral QHS   Continuous Infusions: . sodium chloride      Data Reviewed: I have personally reviewed following labs and imaging studies  CBC: Recent Labs  Lab 02/25/19 1729 02/26/19 0557 02/26/19 1616 02/27/19 0443  WBC 8.0 4.9 5.9 6.6  NEUTROABS 5.9  --   --   --   HGB 6.9* 6.9* 8.4* 8.6*  HCT 19.7* 20.4* 24.0* 25.1*  MCV 97.5 94.9 92.7 95.1  PLT 117* 101* 112* 751*   Basic Metabolic Panel: Recent Labs  Lab 02/25/19 1729 02/25/19 1758 02/26/19 0557 02/27/19 0443  NA  124*  --  129* 133*  K 3.4*  --  3.3* 3.5  CL 89*  --  92* 96*  CO2 22  --  25 25  GLUCOSE 120*  --  108* 114*  BUN 10  --  9 8  CREATININE 1.35*  --  1.32* 1.31*  CALCIUM 8.6*  --  8.6* 8.4*  MG  --  1.2*  --   --    GFR: Estimated Creatinine Clearance: 61.8 mL/min (A) (by C-G formula based on SCr of 1.31 mg/dL (H)). Liver Function Tests: Recent Labs  Lab 02/25/19 1729 02/26/19 0557  AST 112* 93*  ALT 67* 56*  ALKPHOS 120 107  BILITOT 2.1* 2.6*  PROT 5.5* 5.3*  ALBUMIN 3.4* 3.0*   No results for input(s): LIPASE, AMYLASE in the last 168 hours. No results for input(s): AMMONIA in the last 168 hours. Coagulation Profile: No results for input(s): INR, PROTIME in the last 168 hours. Cardiac Enzymes: No results for input(s): CKTOTAL, CKMB, CKMBINDEX, TROPONINI in the last 168 hours. BNP  (last 3 results) No results for input(s): PROBNP in the last 8760 hours. HbA1C: No results for input(s): HGBA1C in the last 72 hours. CBG: No results for input(s): GLUCAP in the last 168 hours. Lipid Profile: No results for input(s): CHOL, HDL, LDLCALC, TRIG, CHOLHDL, LDLDIRECT in the last 72 hours. Thyroid Function Tests: No results for input(s): TSH, T4TOTAL, FREET4, T3FREE, THYROIDAB in the last 72 hours. Anemia Panel: Recent Labs    02/26/19 0557  VITAMINB12 720  FOLATE 22.4  FERRITIN 349*  TIBC 218*  IRON 45  RETICCTPCT 5.0*   Sepsis Labs: No results for input(s): PROCALCITON, LATICACIDVEN in the last 168 hours.  Recent Results (from the past 240 hour(s))  SARS CORONAVIRUS 2 (TAT 6-24 HRS) Nasopharyngeal Nasopharyngeal Swab     Status: None   Collection Time: 02/25/19  7:18 PM   Specimen: Nasopharyngeal Swab  Result Value Ref Range Status   SARS Coronavirus 2 NEGATIVE NEGATIVE Final    Comment: (NOTE) SARS-CoV-2 target nucleic acids are NOT DETECTED. The SARS-CoV-2 RNA is generally detectable in upper and lower respiratory specimens during the acute phase of infection. Negative results do not preclude SARS-CoV-2 infection, do not rule out co-infections with other pathogens, and should not be used as the sole basis for treatment or other patient management decisions. Negative results must be combined with clinical observations, patient history, and epidemiological information. The expected result is Negative. Fact Sheet for Patients: SugarRoll.be Fact Sheet for Healthcare Providers: https://www.woods-mathews.com/ This test is not yet approved or cleared by the [place] FDA and  has been authorized for detection and/or diagnosis of SARS-CoV-2 by FDA under an Emergency Use Authorization (EUA). This EUA will remain  in effect (meaning this test can be used) for the duration of the COVID-19 declaration under Section 56  4(b)(1) of the Act, 21 U.S.C. section 360bbb-3(b)(1), unless the authorization is terminated or revoked sooner. Performed at [facility], [place] [address], [place], [place] [address]       Radiology Studies: Dg Chest 2 View  Result Date: 02/25/2019 CLINICAL DATA:  Shortness of breath. EXAM: CHEST - 2 VIEW COMPARISON:  August 11, 2018. FINDINGS: The heart size and mediastinal contours are within normal limits. Both lungs are clear. No pneumothorax or pleural effusion is noted. The visualized skeletal structures are unremarkable. IMPRESSION: No active cardiopulmonary disease. Electronically Signed   By: Marijo Conception M.D.   On: 02/25/2019 18:27  Ct Head Wo Contrast  Result Date: 02/25/2019 CLINICAL DATA:  Posttraumatic headache after multiple falls. EXAM: CT HEAD WITHOUT CONTRAST TECHNIQUE: Contiguous axial images were obtained from the base of the skull through the vertex without intravenous contrast. COMPARISON:  August 11, 2018. FINDINGS: Brain: Mild diffuse cortical atrophy is noted. Mild chronic ischemic white matter disease is noted. No mass effect or midline shift is noted. Ventricular size is within normal limits. There is no evidence of mass lesion, hemorrhage or acute infarction. Vascular: No hyperdense vessel or unexpected calcification. Skull: Normal. Negative for fracture or focal lesion. Sinuses/Orbits: No acute finding. Other: None. IMPRESSION: Mild diffuse cortical atrophy. Mild chronic ischemic white matter disease. No acute intracranial abnormality seen. Electronically Signed   By: Marijo Conception M.D.   On: 02/25/2019 18:42   US Abdomen Limited Ruq  Result Date: 02/25/2019 CLINICAL DATA:  Elevated LFTs, fatty liver EXAM: ULTRASOUND ABDOMEN LIMITED RIGHT UPPER QUADRANT COMPARISON:  None. FINDINGS: Gallbladder: Shadowing gallstones are present, the largest measuring 6 mm. No sonographic Murphy sign noted by sonographer. Common bile duct: Diameter: 4.3 mm Liver: No focal  lesion identified. Increased echotexture seen throughout. No focal abnormality or biliary ductal dilatation. portal vein is patent on color Doppler imaging with normal direction of blood flow towards the liver. Other: None. IMPRESSION: Cholelithiasis no evidence of acute cholecystitis. Hepatic steatosis. Electronically Signed   By: Prudencio Pair M.D.   On: 02/25/2019 21:24      LOS: 1 day   Time spent: More than 50% of that time was spent in counseling and/or coordination of care.  Antonieta Pert, MD [facility]  02/27/2019, 9:45 AM

## 2019-02-27 NOTE — Interval H&P Note (Signed)
History and Physical Interval Note:  02/27/2019 12:37 PM  Darryl Diaz  has presented today for surgery, with the diagnosis of IDA.  The various methods of treatment have been discussed with the patient and family. After consideration of risks, benefits and other options for treatment, the patient has consented to  Procedure(s): ESOPHAGOGASTRODUODENOSCOPY (EGD) WITH PROPOFOL (N/A) as a surgical intervention.  The patient's history has been reviewed, patient examined, no change in status, stable for surgery.  I have reviewed the patient's chart and labs.  Questions were answered to the patient's satisfaction.     Milus Banister

## 2019-02-27 NOTE — Progress Notes (Signed)
°Cardiology Consultation:  ° °Patient ID: Darryl Diaz °MRN: [id number]; DOB: 10/07/1940 ° °Admit date: 02/25/2019 °Date of Consult: 02/27/2019 ° °Primary Care Provider: Tisovec, Darryl W, MD °Primary Cardiologist: Darryl Cooper, MD  °Primary Electrophysiologist:  None  ° ° °Patient Profile:  ° °Darryl Diaz is a 78 y.o. male with a history of paroxysmal atrial fibrillation on Eliquis, hypertension, hyperlipidemia, GERD, depression, and previous alcohol abuse who is being seen today for the evaluation of atrial fibrillation with RVR at the request of Dr. Kc. ° °History of Present Illness:  ° °[name] is a 78 year old male with the above history who was previously followed at [facility] by Darryl Diaz but more recently has seen Darryl Diaz. He first saw Darryl Diaz in 12/2015. At that time, he reported being diagnosed with paroxysmal atrial fibrillation several years ago and had been started Eliquis. He denied any cardiac symptoms at that time. Patient was last seen by Darryl Diaz, in 02/2017 and was again doing well at that time without any cardiac symptoms. Patient was instructed to follow-up in 1 year but it does not look like this happened. ° °Patient was admitted in 08/2018 with acute posthemorrhagic anemia after presenting with right arm and right-sided flank bruising following a mechanical fall. Hemoglobin as low as 7.9 at that time. No internal bleeding and no signs of active bleeding anywhere. Patient was discharged with instructions to hold Eliquis for 2 weeks.  ° °Patient went to see his PCP on 02/25/2019 for evaluation of recent falls. He was found to have a hemoglobin of 6 and was advised to go to the ED. In the ED, patient report worsening balance issues recently with more 3 falls the last few days. He also noted some worsening lower extremity edema recently.  ° °Upon arrival to the ED, patient mildly tachypneic but vitals otherwise stable. EKG showed normal sinus rhythm with no acute ST/T  changes. BNP mildly elevated at 209.0. Chest x-ray showed no acute findings. WBC 2.02, Hgb 6.9, Plts 117. Na 124, K 3.4, Cl 89, Glucose 120, BUN 10, Scr 1.35. Mg 1.2. Hemoccult positive. COVID-19 testing. Head CT showed no acute intracranial abnormality. Right upper quadrant ultrasound showed cholelithiasis with no evidence of acute cholecystitis and hepatic steatosis. Patient was also noted to be volume overloaded in the ED with bibasilar crackles and 3+ lower extremity edema. Patient was transfused 1 unit of PRBCs and was also give IV Lasix and then admitted for further work-up.  ° °Patient presented for EGD today but was found to be in atrial fibrillation with RVR; therefore, procedure was cancelled. Cardiology was consulted for assistance. ° °At the time of this evaluation, patient resting comfortably eating lunch. He reports feeling more unsteady on his feet the last 3 weeks. He has fallen 3 times in the last week. He fell once on his left arm and has significant bruising there. He states he fell and hit his head one time about 1.5 years ago but has not hit is head during recent falls. Patient is not sure what has caused the falls. Denies tripping over anything. Denies any symptoms prior to falls including palpitations, lightheadedness, dizziness, diaphoresis, chest pain, shortness of breath. No syncope. Other than the falls, he does report some dyspnea on exertion and lower extremity edema over the past 6 weeks. He reports becoming shortness of breath with activities such as walking to the garage. Prior to COVID, he was going to the [employer] and swimming 20 labs 2-3 times   per week. Since COVID, he has not been able to due this but has been exercising on stationary machines at his home. His shortness of breath has not limited this. He denies any exertional chest pain. He has chronic left ankle edema due to previous injury but recently notes bilateral edema. No orthopnea, PND, or weight. He has noted "black stools"  on occasion but no hematuria, hematochezia, or hemoptysis. No recent fevers or illnesses. ° °Patient is a previous smoker but quit over 10 years ago. He describes himself as an "alcoholic." He quit drinking 3 years but has briefly "fallen off the wagon" since then. He last "fell off the wagon" about 1 month ago and states he had 2-3 "bad nights" but that is it. He attends AA meetings. No recreational drug use. He does have a family history of heart disease. His mother died from CHF at the age of 61. His sister who had rheumatic fever as a child died suddenly at the age of 66 (she never had an autopsy). Maternal aunt died from a stroke at the age of 58.   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  °• Atrial fibrillation (HCC)   °• Fall at home 03/28/2016  °• GERD (gastroesophageal reflux disease)   °• H/O seasonal allergies   °• Hx of adenomatous colonic polyps 08/10/2017  °• Hyperlipidemia   °• Hypertension   °• Insomnia   °• Major depressive disorder   ° following wife's death  °• OA (osteoarthritis)   ° hands  ° ° °Past Surgical History:  °Procedure Laterality Date  °• ANKLE SURGERY Left 2014  ° had rods put in   °• COLONOSCOPY    ° [place]  °• HIP ARTHROPLASTY Left 04/01/2016  ° Procedure: ARTHROPLASTY BIPOLAR HIP (HEMIARTHROPLASTY);  Surgeon: Matthew Olin, MD;  Location: [facility] OR;  Service: Orthopedics;  Laterality: Left;  °• HIP CLOSED REDUCTION Left 04/30/2016  ° Procedure: CLOSED REDUCTION HIP;  Surgeon: Darryl Hewitt, MD;  Location: [facility] OR;  Service: Orthopedics;  Laterality: Left;  °• TONSILLECTOMY    °  ° °Home Medications:  °Prior to Admission medications   °Medication Sig Start Date End Date Taking? Authorizing Provider  °acetaminophen (TYLENOL) 500 MG tablet Take 1,000 mg by mouth every 6 (six) hours as needed for headache (pain).   Yes [provider]  °apixaban (ELIQUIS) 5 MG TABS tablet Take 1 tablet (5 mg  total) by mouth 2 (two) times daily. 08/24/18  Yes Vann, Jessica U, DO  °atorvastatin (LIPITOR) 20 MG tablet Take 20 mg by mouth daily.   Yes [provider]  °escitalopram (LEXAPRO) 10 MG tablet Take 10 mg by mouth daily.   Yes [provider]  °ferrous sulfate 325 (65 FE) MG EC tablet Take 325 mg by mouth daily.    Yes [provider]  °folic acid (FOLVITE) 1 MG tablet Take 1 mg by mouth daily.   Yes [provider]  °loratadine (CLARITIN) 10 MG tablet Take 10 mg by mouth daily.   Yes [provider]  °metoprolol (TOPROL-XL) 200 MG 24 hr tablet Take 200 mg by mouth daily.   Yes [provider]  °Multiple Vitamin (MULTIVITAMIN WITH MINERALS) TABS tablet Take 1 tablet by mouth daily.   Yes [provider]  °pantoprazole (PROTONIX) 40 MG tablet Take 40 mg by mouth daily.   Yes [provider]  °  tamsulosin (FLOMAX) 0.4 MG CAPS capsule Take 0.4 mg by mouth.   Yes [provider]  °Thiamine HCl (VITAMIN B-1) 250 MG tablet Take 250 mg by mouth daily.   Yes [provider]  °traZODone (DESYREL) 50 MG tablet Take 50 mg by mouth at bedtime.   Yes [provider]  ° ° °Inpatient Medications: °Scheduled Meds: °• atorvastatin  20 mg Oral Daily  °• escitalopram  10 mg Oral Daily  °• ferrous sulfate  325 mg Oral Daily  °• folic acid  1 mg Oral Daily  °• furosemide  20 mg Intravenous BID  °• loratadine  10 mg Oral Daily  °• metoprolol  200 mg Oral Daily  °• miconazole nitrate   Topical TID  °• multivitamin with minerals  1 tablet Oral Daily  °• pantoprazole  40 mg Oral Daily  °• silver sulfADIAZINE   Topical BID  °• tamsulosin  0.4 mg Oral Daily  °• vitamin B-1  250 mg Oral Daily  °• traZODone  50 mg Oral QHS  ° °Continuous Infusions: °• diltiazem (CARDIZEM) infusion 5 mg/hr (02/27/19 1550)  ° °PRN Meds: °acetaminophen ° °Allergies:    °Allergies  °Allergen Reactions  °• Penicillins Other (See Comments)  °  Did it involve swelling of  the face/tongue/throat, SOB, or low BP? Unknown °Did it involve sudden or severe rash/hives, skin peeling, or any reaction on the inside of your mouth or nose? Unknown °Did you need to seek medical attention at a hospital or doctor's office? Unknown °When did it last happen?    childhood reaction   °If all above answers are “NO”, may proceed with cephalosporin use.  ° ° °Social History:   °Social History  ° °Socioeconomic History  °• Marital status: Widowed  °  Spouse name: Not on file  °• Number of children: 2  °• Years of education: college  °• Highest education level: Not on file  °Occupational History  °• Occupation: retired  °Social Needs  °• Financial resource strain: Not on file  °• Food insecurity  °  Worry: Not on file  °  Inability: Not on file  °• Transportation needs  °  Medical: Not on file  °  Non-medical: Not on file  °Tobacco Use  °• Smoking status: Former Smoker  °• Smokeless tobacco: Never Used  °Substance and Sexual Activity  °• Alcohol use: No  °  Comment: 03/2016   i HAD ONE DRINK & THAT WAS THE FIRST DRINK i HAD IN A LONG TIME   °• Drug use: No  °• Sexual activity: Not on file  °Lifestyle  °• Physical activity  °  Days per week: Not on file  °  Minutes per session: Not on file  °• Stress: Not on file  °Relationships  °• Social connections  °  Talks on phone: Not on file  °  Gets together: Not on file  °  Attends religious service: Not on file  °  Active member of club or organization: Not on file  °  Attends meetings of clubs or organizations: Not on file  °  Relationship status: Not on file  °• Intimate partner violence  °  Fear of current or ex partner: Not on file  °  Emotionally abused: Not on file  °  Physically abused: Not on file  °  Forced sexual activity: Not on file  °Other Topics Concern  °• Not on file  °Social History Narrative  ° The patient is divorced w/ 1 son 1 daughter  °   1 daughter   He is a retired Regulatory affairs officer, he lived in [place] but [employer] including windows  on the world in the [place] room in [place]   He moved to [place] to be near his sister who lives in [place] alcoholic member of AA   2 caffeinated beverages daily prior smoker no drugs    Family History:    Family History  Problem Relation Age of Onset   Heart disease Mother 86   Hypertension Mother    Arthritis Mother    Heart failure Father 54   Stroke Maternal Aunt    Heart failure Sister    Colon cancer Neg Hx    Esophageal cancer Neg Hx    Stomach cancer Neg Hx    Rectal cancer Neg Hx      ROS:  Please see the history of present illness.  Review of Systems  Constitutional: Negative for chills, fever and malaise/fatigue.  HENT: Positive for congestion (allergies).   Respiratory: Positive for cough (allergies). Negative for sputum production and shortness of breath.   Cardiovascular: Positive for leg swelling. Negative for chest pain, palpitations, orthopnea and PND.  Gastrointestinal: Positive for melena. Negative for blood in stool, nausea and vomiting.  Genitourinary: Negative for hematuria.  Musculoskeletal: Positive for falls. Negative for myalgias.  Neurological: Negative for dizziness and loss of consciousness.  Endo/Heme/Allergies: Bruises/bleeds easily.  Psychiatric/Behavioral: Positive for substance abuse (previous tobacco and alcohol abuse).    Physical Exam/Data:   Vitals:   02/27/19 0152 02/27/19 0433 02/27/19 1229 02/27/19 1349  BP:  (!) 148/63 138/61 135/88  Pulse:  82 (!) 101 (!) 133  Resp:  _0 Temp:  98.6 F (37 C) (!) 97.3 F (36.3 C) 98.6 F (37 C)  TempSrc:  Oral Temporal Oral  SpO2:  95% 99% 99%  Weight: 114.8 kg     Height:        Intake/Output Summary (Last 24 hours) at 02/27/2019 1600 Last data filed at 02/27/2019 1330 Gross per 24 hour  Intake --  Output 1400 ml  Net -1400 ml   Last 3 Weights 02/27/2019 02/26/2019 02/25/2019  Weight (lbs) 253 lb 1.4 oz 263 lb 10.7 oz 240 lb  Weight  (kg) 114.8 kg 119.6 kg 108.863 kg     Body mass index is 34.32 kg/m.  General: 78 y.o. male resting comfortably in no acute distress. HEENT: Normocephalic and atraumatic. Sclera clear.  Neck: Supple. No carotid bruits.  Heart: Tachycardic with irregularly irregular rhythm. Distinct S1 and S2. No murmurs, gallops, or rubs. Radial pulses 2+ and equal bilaterally. Lungs: No increased work of breathing. Clear to ausculation bilaterally. No wheezes, rhonchi, or rales.  Abdomen: Soft, non-distended, and non-tender to palpation. Bowel sounds present in all 4 quadrants.  MSK: Normal strength and tone for age. Extremities: 1+ bilateral pitting edema.   Skin: Pale. Warm and dry. Large ecchymosis on left upper extremity and healing ecchymosis on right lower extremity. Neuro: Alert and oriented x3. No focal deficits. Psych: Normal affect. Responds appropriately.   EKG:  The EKG was personally reviewed and demonstrates:  Atrial fibrillation with ventricular rate of 130 bpm. No acute ST/T changes.  Telemetry:  Telemetry was personally reviewed and demonstrates:  Atrial fibrillation with rates in the 120's to 140's.   Relevant CV Studies:  Echocardiogram 02/26/2019: Impressions: 1. Left ventricular ejection fraction, by visual estimation, is 60 to 65%. The left ventricle has normal function. Normal left ventricular  left ventricular hypertrophy. ° 2. Global right ventricle has normal systolic function.The right ventricular size is normal. No increase in right ventricular wall thickness. ° 3. Left atrial size was mildly dilated. ° 4. Right atrial size was normal. ° 5. Mild mitral annular calcification. ° 6. Mild thickening of the anterior and posterior mitral valve leaflet(s). ° 7. Mild to moderate aortic valve annular calcification. ° 8. The mitral valve is normal in structure. No evidence of mitral valve regurgitation. No evidence of mitral stenosis. ° 9. The tricuspid valve is normal in  structure. Tricuspid valve regurgitation is trivial. °10. The aortic valve is tricuspid Aortic valve regurgitation was not visualized by color flow Doppler. Mild to moderate aortic valve sclerosis/calcification without any evidence of aortic stenosis. °11. The pulmonic valve was normal in structure. Pulmonic valve regurgitation is trivial by color flow Doppler. °12. Aortic dilatation noted. °13. There is mild dilatation of the aortic root measuring 38 mm. °14. Moderately elevated pulmonary artery systolic pressure. °15. The inferior vena cava is normal in size with greater than 50% respiratory variability, suggesting right atrial pressure of 3 mmHg. ° °Laboratory Data: ° °High Sensitivity Troponin:  No results for input(s): TROPONINIHS in the last 720 hours.   °Chemistry °Recent Labs  °Lab 02/25/19 °1729 02/26/19 °0557 02/27/19 °0443  °NA 124* 129* 133*  °K 3.4* 3.3* 3.5  °CL 89* 92* 96*  °CO2 22 25 25  °GLUCOSE 120* 108* 114*  °BUN 10 9 8  °CREATININE 1.35* 1.32* 1.31*  °CALCIUM 8.6* 8.6* 8.4*  °GFRNONAA 50* 52* 52*  °GFRAA 58* 60* >60  °ANIONGAP 13 12 12  °  °Recent Labs  °Lab 02/25/19 °1729 02/26/19 °0557  °PROT 5.5* 5.3*  °ALBUMIN 3.4* 3.0*  °AST 112* 93*  °ALT 67* 56*  °ALKPHOS 120 107  °BILITOT 2.1* 2.6*  ° °Hematology °Recent Labs  °Lab 02/26/19 °0557 02/26/19 °1616 02/27/19 °0443  °WBC 4.9 5.9 6.6  °RBC 2.15*   2.15* 2.59* 2.64*  °HGB 6.9* 8.4* 8.6*  °HCT 20.4* 24.0* 25.1*  °MCV 94.9 92.7 95.1  °MCH 32.1 32.4 32.6  °MCHC 33.8 35.0 34.3  °RDW 16.3* 16.9* 17.8*  °PLT 101* 112* 116*  ° °BNP °Recent Labs  °Lab 02/25/19 °1735  °BNP 209.0*  °  °DDimer No results for input(s): DDIMER in the last 168 hours. ° ° °Radiology/Studies:  °Dg Chest 2 View ° °Result Date: 02/25/2019 °CLINICAL DATA:  Shortness of breath. EXAM: CHEST - 2 VIEW COMPARISON:  August 11, 2018. FINDINGS: The heart size and mediastinal contours are within normal limits. Both lungs are clear. No pneumothorax or pleural effusion is noted. The visualized  skeletal structures are unremarkable. IMPRESSION: No active cardiopulmonary disease. Electronically Signed   By: James  Green Jr M.D.   On: 02/25/2019 18:27  ° °Ct Head Wo Contrast ° °Result Date: 02/25/2019 °CLINICAL DATA:  Posttraumatic headache after multiple falls. EXAM: CT HEAD WITHOUT CONTRAST TECHNIQUE: Contiguous axial images were obtained from the base of the skull through the vertex without intravenous contrast. COMPARISON:  August 11, 2018. FINDINGS: Brain: Mild diffuse cortical atrophy is noted. Mild chronic ischemic white matter disease is noted. No mass effect or midline shift is noted. Ventricular size is within normal limits. There is no evidence of mass lesion, hemorrhage or acute infarction. Vascular: No hyperdense vessel or unexpected calcification. Skull: Normal. Negative for fracture or focal lesion. Sinuses/Orbits: No acute finding. Other: None. IMPRESSION: Mild diffuse cortical atrophy. Mild chronic ischemic white matter disease. No acute intracranial abnormality seen.   Electronically Signed   By: James  Green Jr M.D.   On: 02/25/2019 18:42  ° °Us Abdomen Limited Ruq ° °Result Date: 02/25/2019 °CLINICAL DATA:  Elevated LFTs, fatty liver EXAM: ULTRASOUND ABDOMEN LIMITED RIGHT UPPER QUADRANT COMPARISON:  None. FINDINGS: Gallbladder: Shadowing gallstones are present, the largest measuring 6 mm. No sonographic Murphy sign noted by sonographer. Common bile duct: Diameter: 4.3 mm Liver: No focal lesion identified. Increased echotexture seen throughout. No focal abnormality or biliary ductal dilatation. portal vein is patent on color Doppler imaging with normal direction of blood flow towards the liver. Other: None. IMPRESSION: Cholelithiasis no evidence of acute cholecystitis. Hepatic steatosis. Electronically Signed   By: Bindu  Avutu M.D.   On: 02/25/2019 21:24  ° ° °Assessment and Plan:  ° °Atrial Fibrillation with RVR °- Patient admitted for anemia with hemoglobin of 6.9. Initially in sinus rhythm  on admission but was found to be in atrial fibrillation with RVR earlier today when he presented for EGD. Anemia likely trigger. °- EKG shows atrial fibrillation with ventricular rate of 130 bpm. °- Telemetry shows atrial fibrillation with rates in the 120's to 140's. Patient asymptomatic. °- Echo yesterday showed LVEF of 60-65% with mildly dilated left atrium with no significant valvular issues. Also noted to have mild dilatation of aortic root measuring 38 mm. °- Potassium 3.5 today. Goal > 4.0. Will give does of K-Dur 40 mEq. °- Magnesium 1.2 on admission but was repleted. Will recheck today. Goal > 2.0.  °- Will check TSH. °- Patient received 2 doses of IV Cardizem 10mg with no improvement and Cardizem drip is about to be started. Agree with this. °- Continue home Toprol 200mg daily. °- Suspect RVR is due to anemia and will also improve with treatment of anemia. °- CHA2DS2-VASc = 3 (HTN, age x2). Patient on chronic anticoagulation with Eliquis at home. Currently on hold due to acute anemia and GI bleed. Patient has significant bruising. May need to discuss going forward stroke risk vs bleeding risk. ° °Anemia/ GI Bleed °- Hemoglobin 6.9 on admission. Hemoccult positive. °- S/p 2 units of PRBCs on 10/19. Hemoglobin 8.4 today. °- Patient was scheduled for EGD today but was cancelled when patient presented in atrial fibrillation. °- Management per GI. ° °Dyspnea on Exertion and Bilateral Lower Extremity Edema °- BNP mildly elevated in the 200's.  °- Echo showed LVEF of 60-65% with normal diastolic parameters. Wonder if dyspnea on exertion due to dropping hemoglobin or if there is some diastolic component. °- Currently on IV Lasix 20mg twice daily. Documented urinary output of 2.8 in the last 1.5 days and net negative 1.7 L since admission. Weight down 10 lbs since yesterday. Renal function stable. °- Lungs clear but patient does have lower extremity edema. °- Could consider switching to PO Lasix 40mg daily. Could  also consider compression stocking for lower extremity edema. °- Continue to monitor daily weights, strict I/O's, and renal function. ° °Hypertension °- BP currently well controlled at 135/88. °- Continue home Toprol as above. °- Continue Cardizem as above. °- Continue to monitor BP closely with initiation of Cardizem ° °Elevated LFTs °- Hepatic function panel on 10/20: Albumin 3.0, AST 93, ALT 56, Alk Phop 107, Total Bili 2.6, Direct Bili 0.9, Indirect Bili 1.7. °- Right upper quadrant panel showed hepatic steatosis.  °- Hepatitis panel negative. °- Patient has history of alcohol abuse. He quit 3 years ago with 2 brief relapses since then (last one being about 1 month ago for 2-3 days). °-   Continue to monitor. Management per primary team.  ° °Hyperlipidemia °- Patient has history of hyperlipidemia but does not appear to be on any medications at home. °- Will check fasting lipid panel but given mildly elevated LFTs with history of alcohol abuse don't know if we will be able to start statin. ° °Recent Falls °- Patient reports feeling unsteady on his feet for the last 3 weeks with frequent falls. °- Possibly due to dropping hemoglobin. °- No cardiac symptoms prior to fall and no LOC. °- PT was consulted and recommended SNF. ° °CKD Stage III °- Creatinine mildly elevated at 1.35 on admission. Stable at 1.31 today. °- Continue to monitor. ° ° ° °For questions or updates, please contact [facility] °Please consult [email or web address] for contact info under  ° ° ° °Signed, ° E , Diaz  °02/27/2019 4:00 PM ° °

## 2019-02-27 NOTE — Progress Notes (Signed)
Physical Therapy Treatment Patient Details Name: Darryl Diaz MRN: [id number] DOB: 1940-07-13 Today's Date: 02/27/2019    History of Present Illness 78 y.o. male admitted on 02/25/19 for low Hgb sent in by his PCP.  Hgb 6.9.  Pt found to have acute CHF exacerbation with bil LE swelling, holding eliquis due to 3 recent falls (h/o PAF), hyponatremia, frequent falls wiht head CT negative, multiple bruised/scrapes in all extremities, elevated LFTs.  Pt with other significant PMH of OA, HTN, insomnia, colon polyps, h/o ETOH use in remission, L hip closed reduction, L hip arthroplasty, L ankle surgery.      PT Comments    Patient received sleeping in bed. Easily roused. Patient excited about trying to walk. Patient requires increased time for all activities due to lethargy/fatigue, SOB and increased work of breathing with activity. Patient is slightly impulsive and has decreased safety awareness. He requires supervision for bed mobility, transferred with min guard, ambulated with RW 60 feet and then requested to sit back down. Fatigued and breathing heavy after walking. O2 sats in mid 90%, HR up to 110.  Required prolonged seated rest to recover. Assisted patient to [facility] and back to bed.  Patient will benefit from continued skilled PT while here to improve strength, activity tolerance and safety with mobility.      Follow Up Recommendations  SNF;Supervision/Assistance - 24 hour     Equipment Recommendations  None recommended by PT    Recommendations for Other Services       Precautions / Restrictions Precautions Precautions: Fall Precaution Comments: 3 falls in the past week, before that only one in the past year.  Restrictions Weight Bearing Restrictions: No    Mobility  Bed Mobility Overal bed mobility: Needs Assistance Bed Mobility: Supine to Sit;Sit to Supine     Supine to sit: Supervision Sit to supine: Supervision   General bed mobility comments: Pt heavily reliant on bed  rail, but does have bed rails at home. supervision for safety  Transfers Overall transfer level: Needs assistance Equipment used: Rolling walker (2 wheeled) Transfers: Sit to/from Stand Sit to Stand: Min guard         General transfer comment: Patient requires min guard for safety with transfers  Ambulation/Gait Ambulation/Gait assistance: Min guard Gait Distance (Feet): 60 Feet Assistive device: Rolling walker (2 wheeled) Gait Pattern/deviations: Step-to pattern;Decreased stride length;Shuffle Gait velocity: decr   General Gait Details: stiff LEs with ambulation, slightly tremulous   Stairs             Wheelchair Mobility    Modified Rankin (Stroke Patients Only)       Balance Overall balance assessment: Needs assistance Sitting-balance support: Feet supported Sitting balance-Leahy Scale: Good     Standing balance support: Bilateral upper extremity supported;During functional activity Standing balance-Leahy Scale: Fair Standing balance comment: needs external support in standing from therapist and RW.                              Cognition Arousal/Alertness: Awake/alert Behavior During Therapy: WFL for tasks assessed/performed Overall Cognitive Status: Within Functional Limits for tasks assessed Area of Impairment: Safety/judgement                         Safety/Judgement: Decreased awareness of deficits;Decreased awareness of safety            Exercises      General Comments  Pertinent Vitals/Pain Pain Assessment: No/denies pain    Home Living                      Prior Function            PT Goals (current goals can now be found in the care plan section) Acute Rehab PT Goals Patient Stated Goal: to stop falling PT Goal Formulation: With patient Time For Goal Achievement: 03/12/19 Potential to Achieve Goals: Good Progress towards PT goals: Progressing toward goals    Frequency    Min  3X/week      PT Plan Current plan remains appropriate    Co-evaluation              AM-PAC PT "6 Clicks" Mobility   Outcome Measure  Help needed turning from your back to your side while in a flat bed without using bedrails?: A Little Help needed moving from lying on your back to sitting on the side of a flat bed without using bedrails?: A Little Help needed moving to and from a bed to a chair (including a wheelchair)?: A Little Help needed standing up from a chair using your arms (e.g., wheelchair or bedside chair)?: A Little Help needed to walk in hospital room?: A Little Help needed climbing 3-5 steps with a railing? : A Lot 6 Click Score: 17    End of Session Equipment Utilized During Treatment: Gait belt Activity Tolerance: Patient limited by fatigue Patient left: in bed;with bed alarm set;with call bell/phone within reach Nurse Communication: Mobility status;Other (comment)(patient's condom cath came off) PT Visit Diagnosis: Unsteadiness on feet (R26.81);Muscle weakness (generalized) (M62.81);Difficulty in walking, not elsewhere classified (R26.2);History of falling (Z91.81)     Time: 2919-1660 PT Time Calculation (min) (ACUTE ONLY): 31 min  Charges:  $Gait Training: 8-22 mins $Therapeutic Activity: 8-22 mins                     Shantele Reller, PT, GCS 02/27/19,11:20 AM

## 2019-02-27 NOTE — Progress Notes (Signed)
He was found to be in rapid afib in preop. GIven B blocker twice by anesthesia and no improvement and so the EGD was cancelled for today.  I will alert his hospitalist, allow him to have heart healthy diet, NPO after MD for possible repeat EGD attempt tomorrow.

## 2019-02-28 ENCOUNTER — Inpatient Hospital Stay (HOSPITAL_COMMUNITY): Payer: Medicare Other | Admitting: Anesthesiology

## 2019-02-28 ENCOUNTER — Encounter (HOSPITAL_COMMUNITY): Payer: Self-pay | Admitting: Gastroenterology

## 2019-02-28 ENCOUNTER — Encounter (HOSPITAL_COMMUNITY)
Admission: EM | Disposition: A | Discharge status: Skilled Nursing Facility | Payer: Self-pay | Source: Home / Self Care | Attending: Internal Medicine

## 2019-02-28 HISTORY — PX: ESOPHAGOGASTRODUODENOSCOPY (EGD) WITH PROPOFOL: SHX5813

## 2019-02-28 HISTORY — PX: BIOPSY: SHX5522

## 2019-02-28 LAB — LIPID PANEL
Cholesterol: 81 mg/dL (ref 0–200)
HDL: 47 mg/dL (ref >40)
LDL Cholesterol: 29 mg/dL (ref 0–99)
Total CHOL/HDL Ratio: 1.7 RATIO
Triglycerides: 27 mg/dL (ref <150)
VLDL: 5 mg/dL (ref 0–40)

## 2019-02-28 LAB — HEMOGLOBIN A1C
Hgb A1c MFr Bld: 4.7 % — ABNORMAL LOW (ref 4.8–5.6)
Mean Plasma Glucose: 88.19 mg/dL

## 2019-02-28 LAB — CBC
HCT: 26.1 % — ABNORMAL LOW (ref 39.0–52.0)
Hemoglobin: 8.6 g/dL — ABNORMAL LOW (ref 13.0–17.0)
MCH: 32.1 pg (ref 26.0–34.0)
MCHC: 33 g/dL (ref 30.0–36.0)
MCV: 97.4 fL (ref 80.0–100.0)
Platelets: 133 10*3/uL — ABNORMAL LOW (ref 150–400)
RBC: 2.68 MIL/uL — ABNORMAL LOW (ref 4.22–5.81)
RDW: 18.5 % — ABNORMAL HIGH (ref 11.5–15.5)
WBC: 6.5 10*3/uL (ref 4.0–10.5)
nRBC: 0.3 % — ABNORMAL HIGH (ref 0.0–0.2)

## 2019-02-28 LAB — COMPREHENSIVE METABOLIC PANEL
ALT: 49 U/L — ABNORMAL HIGH (ref 0–44)
AST: 81 U/L — ABNORMAL HIGH (ref 15–41)
Albumin: 2.7 g/dL — ABNORMAL LOW (ref 3.5–5.0)
Alkaline Phosphatase: 98 U/L (ref 38–126)
Anion gap: 15 (ref 5–15)
BUN: 10 mg/dL (ref 8–23)
CO2: 24 mmol/L (ref 22–32)
Calcium: 7.6 mg/dL — ABNORMAL LOW (ref 8.9–10.3)
Chloride: 88 mmol/L — ABNORMAL LOW (ref 98–111)
Creatinine, Ser: 1.24 mg/dL (ref 0.61–1.24)
GFR calc Af Amer: >60 mL/min (ref >60)
GFR calc non Af Amer: 56 mL/min — ABNORMAL LOW (ref >60)
Glucose, Bld: 396 mg/dL — ABNORMAL HIGH (ref 70–99)
Potassium: 3.2 mmol/L — ABNORMAL LOW (ref 3.5–5.1)
Sodium: 127 mmol/L — ABNORMAL LOW (ref 135–145)
Total Bilirubin: 2 mg/dL — ABNORMAL HIGH (ref 0.3–1.2)
Total Protein: 5.1 g/dL — ABNORMAL LOW (ref 6.5–8.1)

## 2019-02-28 SURGERY — ESOPHAGOGASTRODUODENOSCOPY (EGD) WITH PROPOFOL
Anesthesia: Monitor Anesthesia Care

## 2019-02-28 MED ORDER — PROPOFOL 500 MG/50ML IV EMUL
INTRAVENOUS | Status: DC | PRN
  Administered 2019-02-28: 100 ug/kg/min via INTRAVENOUS

## 2019-02-28 MED ORDER — POTASSIUM CHLORIDE CRYS ER 10 MEQ PO TBCR
30.0000 meq | EXTENDED_RELEASE_TABLET | Freq: Once | ORAL | Status: AC
  Administered 2019-02-28: 30 meq via ORAL
  Filled 2019-02-28: qty 3

## 2019-02-28 MED ORDER — PANTOPRAZOLE SODIUM 40 MG PO TBEC
40.0000 mg | DELAYED_RELEASE_TABLET | Freq: Two times a day (BID) | ORAL | Status: DC
  Administered 2019-02-28 – 2019-03-05 (×10): 40 mg via ORAL
  Filled 2019-02-28 (×10): qty 1

## 2019-02-28 MED ORDER — LACTATED RINGERS IV SOLN
INTRAVENOUS | Status: DC
  Administered 2019-02-28: 08:00:00 via INTRAVENOUS

## 2019-02-28 MED ORDER — POTASSIUM CHLORIDE CRYS ER 20 MEQ PO TBCR
20.0000 meq | EXTENDED_RELEASE_TABLET | Freq: Every day | ORAL | Status: DC
  Administered 2019-02-28 – 2019-03-02 (×3): 20 meq via ORAL
  Filled 2019-02-28 (×3): qty 1

## 2019-02-28 MED ORDER — PROPOFOL 10 MG/ML IV BOLUS
INTRAVENOUS | Status: DC | PRN
  Administered 2019-02-28 (×2): 20 mg via INTRAVENOUS

## 2019-02-28 SURGICAL SUPPLY — 14 items

## 2019-02-28 NOTE — Anesthesia Preprocedure Evaluation (Signed)
Anesthesia Evaluation  Patient identified by MRN, date of birth, ID band Patient awake    Reviewed: Allergy & Precautions, NPO status , Patient's Chart, lab work & pertinent test results  History of Anesthesia Complications Negative for: history of anesthetic complications  Airway Mallampati: III  TM Distance: >3 FB Neck ROM: Full    Dental no notable dental hx.    Pulmonary neg pulmonary ROS, former smoker,    Pulmonary exam normal breath sounds clear to auscultation       Cardiovascular hypertension, Pt. on medications and Pt. on home beta blockers Normal cardiovascular exam+ dysrhythmias Atrial Fibrillation  Rhythm:Irregular Rate:Tachycardia     Neuro/Psych PSYCHIATRIC DISORDERS Depression negative neurological ROS     GI/Hepatic GERD  ,(+)     substance abuse  alcohol use, Hepatitis -  Endo/Other  negative endocrine ROS  Renal/GU Renal InsufficiencyRenal disease  negative genitourinary   Musculoskeletal  (+) Arthritis , Osteoarthritis,    Abdominal   Peds  Hematology  (+) anemia ,   Anesthesia Other Findings   Reproductive/Obstetrics                             Anesthesia Physical  Anesthesia Plan  ASA: III  Anesthesia Plan: MAC   Post-op Pain Management:    Induction: Intravenous  PONV Risk Score and Plan: 1 and Propofol infusion, TIVA and Treatment may vary due to age or medical condition  Airway Management Planned: Natural Airway, Nasal Cannula and Simple Face Mask  Additional Equipment: None  Intra-op Plan:   Post-operative Plan:   Informed Consent: I have reviewed the patients History and Physical, chart, labs and discussed the procedure including the risks, benefits and alternatives for the proposed anesthesia with the patient or authorized representative who has indicated his/her understanding and acceptance.       Plan Discussed with:   Anesthesia Plan  Comments:         Anesthesia Quick Evaluation

## 2019-02-28 NOTE — Progress Notes (Addendum)
PROGRESS NOTE    Darryl Diaz  QIW:[id number] DOB: [birth year] DOA: 02/25/2019 PCP: [name], MD   Brief Narrative: 52yom w PAF on eliquis ,depression,GERD, history of colon polyps, hypertension, hyperlipidemia, insomnia, depression, osteoarthritis presenting to the ED for evaluation oflow hemoglobin, recent falls, and bilateral lower extremity edema blood work was done by PCP and was told his hemoglobin dropped to 6.0 from baseline of 11.0, 8 months ago. He also reported that he lost his balance and had 3 falls last week. And has worsening bilateral lower extremity edema, dyspnea on exertion for the past 1 month. In ER,hypoxic to the 80s on RA, with extensive bruising in left arm right lower leg, lower extremities swelling, hemoglobin was 6.9, digital rectal exam was positive-Eliquis was held, x-ray was unremarkab. GI  Consulted. He is s/p 2 unit PRBC, on lasix iv.  10/21-was going down for endoscopy but found to be in A. fib with RVR in 140s, once for 2 PCU on Cardizem drip cardiology consulted added amiodarone drip 10/22-status post EGD still in RVR  Subjective:  status post EGD still in RVR Feels well denies any symptoms from rapid A. fib heart rate around 100-1 30s This male. Assessment & Plan:  Acute blood loss anemia with hem + stool- suspect multifactorial with gi bleed, dilutional from edema, blood loss from burising in extremities in the setting on eliquis- on hold for now, s/p 2 units, hb stable increased appropriately.  Status post  EGD- "Moderate non-specific pangastritis. May be related to alcohol. Biopsies taken to check for H. pylori.LA Grade A GE junction esophagitis. Slight linear eschar present, this may have been a site of a recent MW tear" okay to resume diet and anticoagulation as per GI. 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 ON PPI 40MG  DAILY.  Symptomatic anemia-s/p 2 unit prbc as above.  Globin stable 8.6 g.  Falls at home- likely worsened by  anemia/leg edema. Pt/to eval to continue and advised SNF. Ct head on admission neg.b12 stable.  Bilateral lower extremity edema w DOE/SOB, suspecting acute CHF W preserved lvef- lvef  in echo. Cont on on IV LASIX.   Hyponatremia: from fluid overload- improving on lasix. Cont diuresis and monitor bmp Recent Labs  Lab 02/25/19 1729 02/26/19 0557 02/27/19 0443 02/28/19 0314  NA 124* 129* 133* 127*   PAF-with RVR since 10/21 remains on amiodarone drip and Cardizem drip followed by cardiology.  Also on metoprolol 200 MG daily.  CHA2DS2-VASc score 3.  GI is okay to resume anticoagulation, defer to cardiology.  Patient has had falls at home.  CKD stage III a-creat stable at 1.2. monitor Recent Labs  Lab 02/25/19 1729 02/26/19 0557 02/27/19 0443 02/28/19 0314  BUN 10 9 8 10   CREATININE 1.35* 1.32* 1.31* 1.24   Hypokalemia at 3.2 will replete. Hypomagnesemia: was repleted continue on magnesium oxide as well Hyponatremia : Sodium holding 124s to 130.  Also on Lasix monitor. Recent Labs  Lab 02/25/19 1729 02/26/19 0557 02/27/19 0443 02/28/19 0314  NA 124* 129* 133* 127*   Mild thrombocytopenia-stable,monitor   Mild transaminitis: ast/alt/alp/tb- 93/56/107/2.6-LFTs improving.has alcohol abuse history, I wonder if he had liver cirrhosis also contributing to his leg edema. Ct abdomen in 08/2018 showed hepatic steatosis.  Hyperglycemia blood sugar noted to be 396 this morning.  Check hemoglobin A1c.   DVT prophylaxis: SCD Code Status: full Family Communication: plan of care discussed with patient in detail.  Disposition Plan: Remains inpatient pending further improvement in his RVR.  PT advised  skilled nursing facility.    Consultants: gi,cardio  Procedures: EGD 10/22 Moderate non-specific pangastritis. May be related to alcohol. Biopsies taken to check for H. pylori. LA Grade A GE junction esophagitis. Slight linear eschar present, this may have been a site of a recent MW tear,  bleeding. Impression: - Return patient to hospital ward for ongoing care. - OK to resume solid food. OK to resume blood thinner.  TTE 1. Left ventricular ejection fraction, by visual estimation, is 60 to 65%.The left ventricle has normal function. Normal left ventricular size. There is no left ventricular hypertrophy. 2. Global right ventricle has normal systolic function.The right ventricular size is normal. No increase in right ventricular wall thickness. 3. Left atrial size was mildly dilated. 4. Right atrial size was normal. 5. Mild mitral annular calcification. 6. Mild thickening of the anterior and posterior mitral valve leaflet(s). 7. Mild to moderate aortic valve annular calcification. 8. The mitral valve is normal in structure. No evidence of mitral valve regurgitation. No evidence of mitral stenosis. 9. The tricuspid valve is normal in structure. Tricuspid valve regurgitation is trivial. 10. The aortic valve is tricuspid Aortic valve regurgitation was not visualized by color flow Doppler. Mild to moderate aortic valve sclerosis/calcification without any evidence of aortic stenosis. 11. The pulmonic valve was normal in structure. Pulmonic valve regurgitation is trivial by color flow Doppler. 12. Aortic dilatation noted. 13. There is mild dilatation of the aortic root measuring 38 mm. 14. Moderately elevated pulmonary artery systolic pressure. 15. The inferior vena cava is normal in size with greater than 50% respiratory variability, suggesting right atrial pressure of 3 mmHg.  Microbiology:  Antimicrobials: Anti-infectives (From admission, onward)   None       Objective: Vitals:   02/28/19 0751 02/28/19 0853 02/28/19 0932 02/28/19 0950  BP: 136/85 (!) 99/45 118/68 118/68  Pulse: (!) 104 (!) 105  82  Resp: (!) 27 (!) 28    Temp: 98 F (36.7 C) 97.8 F (36.6 C) 98 F (36.7 C)   TempSrc: Temporal Oral Oral   SpO2: 98% 100% 100%   Weight:      Height:         Intake/Output Summary (Last 24 hours) at 02/28/2019 1027 Last data filed at 02/28/2019 0920 Gross per 24 hour  Intake 826.36 ml  Output 1550 ml  Net -723.64 ml   Filed Weights   02/26/19 0500 02/27/19 0152 02/28/19 0448  Weight: 119.6 kg 114.8 kg 110 kg   Weight change: -4.8 kg  Body mass index is 32.89 kg/m.  Intake/Output from previous day: 10/21 0701 - 10/22 0700 In: 426.4 [I.V.:426.4] Out: 700 [Urine:700] Intake/Output this shift: Total I/O In: 400 [I.V.:400] Out: 850 [Urine:850]  Examination:  General exam: Appears calm and comfortable NAD,, on room air. HEENT:PERRL,Oral mucosa moist, Ear/Nose normal on gross exam Respiratory system: Bilateral equal air entry, normal vesicular breath sounds, no wheezes or crackles  Cardiovascular system: S1 & S2 heard, regular rate and rhythm and tachycardic 120s,  no JVD, murmurs. Gastrointestinal system: Abdomen is  soft, non tender, non distended, BS +  Nervous System:Alert and oriented. No focal neurological deficits/moving extremities, sensation intact. Extremities:  B/l pitting edema+-endorses chronic edema, no clubbing, distal peripheral pulses palpable. Skin: No rashes, lesions, no icterus.ecchymosis present on multiple sites left upper extremity right lower extremity right flank  MSK: Normal muscle bulk,tone ,power  Medications:  Scheduled Meds:  atorvastatin  20 mg Oral Daily   escitalopram  10 mg Oral Daily  ferrous sulfate  325 mg Oral Daily   folic acid  1 mg Oral Daily   furosemide  20 mg Intravenous BID   loratadine  10 mg Oral Daily   magnesium oxide  400 mg Oral BID   metoprolol  200 mg Oral Daily   miconazole nitrate   Topical TID   multivitamin with minerals  1 tablet Oral Daily   pantoprazole  40 mg Oral BID AC   silver sulfADIAZINE   Topical BID   tamsulosin  0.4 mg Oral Daily   vitamin B-1  250 mg Oral Daily   traZODone  50 mg Oral QHS   Continuous Infusions:  amiodarone 30  mg/hr (02/28/19 0741)   diltiazem (CARDIZEM) infusion 10 mg/hr (02/28/19 0659)    Data Reviewed: I have personally reviewed following labs and imaging studies  CBC: Recent Labs  Lab 02/25/19 1729 02/26/19 0557 02/26/19 1616 02/27/19 0443 02/28/19 0314  WBC 8.0 4.9 5.9 6.6 6.5  NEUTROABS 5.9  --   --   --   --   HGB 6.9* 6.9* 8.4* 8.6* 8.6*  HCT 19.7* 20.4* 24.0* 25.1* 26.1*  MCV 97.5 94.9 92.7 95.1 97.4  PLT 117* 101* 112* 116* 762*   Basic Metabolic Panel: Recent Labs  Lab 02/25/19 1729 02/25/19 1758 02/26/19 0557 02/27/19 0443 02/27/19 1618 02/28/19 0314  NA 124*  --  129* 133*  --  127*  K 3.4*  --  3.3* 3.5  --  3.2*  CL 89*  --  92* 96*  --  88*  CO2 22  --  25 25  --  24  GLUCOSE 120*  --  108* 114*  --  396*  BUN 10  --  9 8  --  10  CREATININE 1.35*  --  1.32* 1.31*  --  1.24  CALCIUM 8.6*  --  8.6* 8.4*  --  7.6*  MG  --  1.2*  --   --  1.2*  --    GFR: Estimated Creatinine Clearance: 63.9 mL/min (by C-G formula based on SCr of 1.24 mg/dL). Liver Function Tests: Recent Labs  Lab 02/25/19 1729 02/26/19 0557 02/28/19 0314  AST 112* 93* 81*  ALT 67* 56* 49*  ALKPHOS 120 107 98  BILITOT 2.1* 2.6* 2.0*  PROT 5.5* 5.3* 5.1*  ALBUMIN 3.4* 3.0* 2.7*   No results for input(s): LIPASE, AMYLASE in the last 168 hours. No results for input(s): AMMONIA in the last 168 hours. Coagulation Profile: No results for input(s): INR, PROTIME in the last 168 hours. Cardiac Enzymes: No results for input(s): CKTOTAL, CKMB, CKMBINDEX, TROPONINI in the last 168 hours. BNP (last 3 results) No results for input(s): PROBNP in the last 8760 hours. HbA1C: No results for input(s): HGBA1C in the last 72 hours. CBG: No results for input(s): GLUCAP in the last 168 hours. Lipid Profile: Recent Labs    02/28/19 0314  CHOL 81  HDL 47  LDLCALC 29  TRIG 27  CHOLHDL 1.7   Thyroid Function Tests: Recent Labs    02/27/19 1618  TSH 1.737   Anemia Panel: Recent Labs     02/26/19 0557  VITAMINB12 720  FOLATE 22.4  FERRITIN 349*  TIBC 218*  IRON 45  RETICCTPCT 5.0*   Sepsis Labs: No results for input(s): PROCALCITON, LATICACIDVEN in the last 168 hours.  Recent Results (from the past 240 hour(s))  SARS CORONAVIRUS 2 (TAT 6-24 HRS) Nasopharyngeal Nasopharyngeal Swab     Status: None   Collection  Time: 02/25/19  7:18 PM   Specimen: Nasopharyngeal Swab  Result Value Ref Range Status   SARS Coronavirus 2 NEGATIVE NEGATIVE Final    Comment: (NOTE) SARS-CoV-2 target nucleic acids are NOT DETECTED. The SARS-CoV-2 RNA is generally detectable in upper and lower respiratory specimens during the acute phase of infection. Negative results do not preclude SARS-CoV-2 infection, do not rule out co-infections with other pathogens, and should not be used as the sole basis for treatment or other patient management decisions. Negative results must be combined with clinical observations, patient history, and epidemiological information. The expected result is Negative. Fact Sheet for Patients: SugarRoll.be Fact Sheet for Healthcare Providers: https://www.woods-mathews.com/ This test is not yet approved or cleared by the [place] FDA and  has been authorized for detection and/or diagnosis of SARS-CoV-2 by FDA under an Emergency Use Authorization (EUA). This EUA will remain  in effect (meaning this test can be used) for the duration of the COVID-19 declaration under Section 56 4(b)(1) of the Act, 21 U.S.C. section 360bbb-3(b)(1), unless the authorization is terminated or revoked sooner. Performed at [facility], [place] [address], [place], [place] [address]       Radiology Studies: No results found.    LOS: 2 days   Time spent: More than 50% of that time was spent in counseling and/or coordination of care.  Antonieta Pert, MD [facility]  02/28/2019, 10:27 AM

## 2019-02-28 NOTE — Progress Notes (Signed)
Progress Note  Patient Name: Morgon Pamer Date of Encounter: 02/28/2019  Primary Cardiologist: Sherren Mocha, MD   Subjective   Feeling better.  Happy to be able to eat.   Inpatient Medications    Scheduled Meds: . atorvastatin  20 mg Oral Daily  . escitalopram  10 mg Oral Daily  . ferrous sulfate  325 mg Oral Daily  . folic acid  1 mg Oral Daily  . furosemide  20 mg Intravenous BID  . loratadine  10 mg Oral Daily  . magnesium oxide  400 mg Oral BID  . metoprolol  200 mg Oral Daily  . miconazole nitrate   Topical TID  . multivitamin with minerals  1 tablet Oral Daily  . pantoprazole  40 mg Oral BID AC  . silver sulfADIAZINE   Topical BID  . tamsulosin  0.4 mg Oral Daily  . vitamin B-1  250 mg Oral Daily  . traZODone  50 mg Oral QHS   Continuous Infusions: . amiodarone 30 mg/hr (02/28/19 0741)  . diltiazem (CARDIZEM) infusion 10 mg/hr (02/28/19 0659)   PRN Meds: acetaminophen   Vital Signs    Vitals:   02/28/19 0751 02/28/19 0853 02/28/19 0932 02/28/19 0950  BP: 136/85 (!) 99/45 118/68 118/68  Pulse: (!) 104 (!) 105  82  Resp: (!) 27 (!) 28    Temp: 98 F (36.7 C) 97.8 F (36.6 C) 98 F (36.7 C)   TempSrc: Temporal Oral Oral   SpO2: 98% 100% 100%   Weight:      Height:        Intake/Output Summary (Last 24 hours) at 02/28/2019 1029 Last data filed at 02/28/2019 0920 Gross per 24 hour  Intake 826.36 ml  Output 1550 ml  Net -723.64 ml   Last 3 Weights 02/28/2019 02/27/2019 02/26/2019  Weight (lbs) 242 lb 8.1 oz 253 lb 1.4 oz 263 lb 10.7 oz  Weight (kg) 110 kg 114.8 kg 119.6 kg      Telemetry    Atrial fibrillation.  Rate >100 bpm - Personally Reviewed  ECG    n/a - Personally Reviewed  Physical Exam   VS:  BP 118/68   Pulse 82   Temp 98 F (36.7 C) (Oral)   Resp (!) 28   Ht 6' (1.829 m)   Wt 110 kg   SpO2 100%   BMI 32.89 kg/m  , BMI Body mass index is 32.89 kg/m. GENERAL:  Well appearing HEENT: Pupils equal round and  reactive, fundi not visualized, oral mucosa unremarkable NECK:  No jugular venous distention, waveform within normal limits, carotid upstroke brisk and symmetric, no bruits, no thyromegaly LYMPHATICS:  No cervical adenopathy LUNGS:  Clear to auscultation bilaterally HEART: Tachycardic.  Irregularly irregular.  PMI not displaced or sustained,S1 and S2 within normal limits, no S3, no S4, no clicks, no rubs,  murmurs ABD:  Flat, positive bowel sounds normal in frequency in pitch, no bruits, no rebound, no guarding, no midline pulsatile mass, no hepatomegaly, no splenomegaly EXT:  2 plus pulses throughout, no edema, no cyanosis no clubbing SKIN:  No rashes no nodules NEURO:  Cranial nerves II through XII grossly intact, motor grossly intact throughout PSYCH:  Cognitively intact, oriented to person place and time   Labs    High Sensitivity Troponin:  No results for input(s): TROPONINIHS in the last 720 hours.    Chemistry Recent Labs  Lab 02/25/19 1729 02/26/19 0557 02/27/19 0443 02/28/19 0314  NA 124* 129* 133* 127*  K 3.4* 3.3* 3.5 3.2*  CL 89* 92* 96* 88*  CO2 22 25 25 24   GLUCOSE 120* 108* 114* 396*  BUN 10 9 8 10   CREATININE 1.35* 1.32* 1.31* 1.24  CALCIUM 8.6* 8.6* 8.4* 7.6*  PROT 5.5* 5.3*  --  5.1*  ALBUMIN 3.4* 3.0*  --  2.7*  AST 112* 93*  --  81*  ALT 67* 56*  --  49*  ALKPHOS 120 107  --  98  BILITOT 2.1* 2.6*  --  2.0*  GFRNONAA 50* 52* 52* 56*  GFRAA 58* 60* >60 >60  ANIONGAP 13 12 12 15      Hematology Recent Labs  Lab 02/26/19 1616 02/27/19 0443 02/28/19 0314  WBC 5.9 6.6 6.5  RBC 2.59* 2.64* 2.68*  HGB 8.4* 8.6* 8.6*  HCT 24.0* 25.1* 26.1*  MCV 92.7 95.1 97.4  MCH 32.4 32.6 32.1  MCHC 35.0 34.3 33.0  RDW 16.9* 17.8* 18.5*  PLT 112* 116* 133*    BNP Recent Labs  Lab 02/25/19 1735  BNP 209.0*     DDimer No results for input(s): DDIMER in the last 168 hours.   Radiology    No results found.  Cardiac Studies   Echo 02/26/19:  IMPRESSIONS  1. Left ventricular ejection fraction, by visual estimation, is 60 to 65%. The left ventricle has normal function. Normal left ventricular size. There is no left ventricular hypertrophy.  2. Global right ventricle has normal systolic function.The right ventricular size is normal. No increase in right ventricular wall thickness.  3. Left atrial size was mildly dilated.  4. Right atrial size was normal.  5. Mild mitral annular calcification.  6. Mild thickening of the anterior and posterior mitral valve leaflet(s).  7. Mild to moderate aortic valve annular calcification.  8. The mitral valve is normal in structure. No evidence of mitral valve regurgitation. No evidence of mitral stenosis.  9. The tricuspid valve is normal in structure. Tricuspid valve regurgitation is trivial. 10. The aortic valve is tricuspid Aortic valve regurgitation was not visualized by color flow Doppler. Mild to moderate aortic valve sclerosis/calcification without any evidence of aortic stenosis. 11. The pulmonic valve was normal in structure. Pulmonic valve regurgitation is trivial by color flow Doppler. 12. Aortic dilatation noted. 13. There is mild dilatation of the aortic root measuring 38 mm. 14. Moderately elevated pulmonary artery systolic pressure. 15. The inferior vena cava is normal in size with greater than 50% respiratory variability, suggesting right atrial pressure of 3 mmHg.  Patient Profile     Mr. Mullendore is a 32M with PAF, hyeprtension, hyperlipidemia, GERD, prior EtOH abuse admitted with GI bleed and falls.   Assessment & Plan    # Atrial fibrillation with RVR: Heart rate is better but still remains >100 bpm. Continue metoprolol and amiodarone.  Increase diltiazem to 15 mg/hr.  Anticoagulation on hold due to GI bleed and frequent falls with significant ecchymosis.  However he has been deemed OK to resume anticoagulation.  We will resume Eliquis tomorrow.  # GI bleed: Went for EGD  today and had a Mallory Weiss tear and erosions/erythema in stomach.  Concern for EtOH related gastritis.  He was deemed stable to resume anticoagulation and PPI was increased.   # Hypertension: Blood pressure has been labile.  BP control is focused on rate management with metoprolol and diltiazem.   # Acute on chronic diastolic heart failure: Breathing is improving.  Renal function improving with diuresis.  Continue lasix 20mg  IV bid. BP  control as above.        For questions or updates, please contact [place] Please consult [email or web address] for contact info under        Signed, Skeet Latch, MD  02/28/2019, 10:29 AM

## 2019-02-28 NOTE — Anesthesia Procedure Notes (Signed)
Procedure Name: MAC Date/Time: 02/28/2019 8:36 AM Performed by: Marsa Aris, CRNA Pre-anesthesia Checklist: Patient identified, Emergency Drugs available, Suction available and Patient being monitored Patient Re-evaluated:Patient Re-evaluated prior to induction Oxygen Delivery Method: Nasal cannula Preoxygenation: Pre-oxygenation with 100% oxygen

## 2019-02-28 NOTE — Anesthesia Postprocedure Evaluation (Signed)
Anesthesia Post Note  Patient: Darryl Diaz  Procedure(s) Performed: ESOPHAGOGASTRODUODENOSCOPY (EGD) WITH PROPOFOL (N/A ) BIOPSY     Patient location during evaluation: Endoscopy Anesthesia Type: MAC Level of consciousness: awake and alert Pain management: pain level controlled Vital Signs Assessment: post-procedure vital signs reviewed and stable Respiratory status: spontaneous breathing, nonlabored ventilation and respiratory function stable Cardiovascular status: stable and blood pressure returned to baseline Postop Assessment: no apparent nausea or vomiting Anesthetic complications: no    Last Vitals:  Vitals:   02/28/19 0853 02/28/19 0932  BP: (!) 99/45 118/68  Pulse: (!) 105   Resp: (!) 28   Temp: 36.6 C 36.7 C  SpO2: 100% 100%    Last Pain:  Vitals:   02/28/19 0932  TempSrc: Oral  PainSc: 0-No pain                 Lynda Rainwater

## 2019-02-28 NOTE — Evaluation (Signed)
Occupational Therapy Evaluation Patient Details Name: Darryl Diaz MRN: [id number] DOB: 08/24/1940 Today's Date: 02/28/2019    History of Present Illness 78 y.o. male admitted on 02/25/19 for low Hgb sent in by his PCP.  Hgb 6.9.  Pt found to have acute CHF exacerbation with bil LE swelling, holding eliquis due to 3 recent falls (h/o PAF), hyponatremia, frequent falls wiht head CT negative, multiple bruised/scrapes in all extremities, elevated LFTs.  Pt with other significant PMH of OA, HTN, insomnia, colon polyps, h/o ETOH use in remission, L hip closed reduction, L hip arthroplasty, L ankle surgery.     Clinical Impression   PTA Pt mod I in home setting with good neighbor and family support. Today Pt is min guard for bed mobility, min A for transfers with RW, decreased safety awareness, generalized weakness, and history of falls (Hb?). At this time recommending SNF post-acute for activity tolerance, balance, and maximize safety and independence in ADL and functional transfers. OT will follow acutely - next session focus on energy conservation education and functional activity tolerance in standing.     Follow Up Recommendations  SNF;Supervision/Assistance - 24 hour    Equipment Recommendations  None recommended by OT(Pt has appropriate DME)    Recommendations for Other Services       Precautions / Restrictions Precautions Precautions: Fall Precaution Comments: 3 falls in the past week, before that only one in the past year.  Restrictions Weight Bearing Restrictions: No      Mobility Bed Mobility Overal bed mobility: Needs Assistance Bed Mobility: Supine to Sit;Sit to Supine     Supine to sit: Min guard(line management) Sit to supine: Min guard(line management)   General bed mobility comments: Pt heavily reliant on bed rail, but does have bed rails at home. no line awareness  Transfers Overall transfer level: Needs assistance Equipment used: Rolling walker (2  wheeled) Transfers: Sit to/from Stand Sit to Stand: Min assist         General transfer comment: Min assist to stand from elevated EOB. rocking momentum used and min A for boost and steady assist    Balance Overall balance assessment: Needs assistance Sitting-balance support: Feet supported;Bilateral upper extremity supported Sitting balance-Leahy Scale: Good     Standing balance support: Bilateral upper extremity supported;Single extremity supported Standing balance-Leahy Scale: Poor Standing balance comment: approaching fair - RW essential for dynamic tasks                           ADL either performed or assessed with clinical judgement   ADL Overall ADL's : Needs assistance/impaired Eating/Feeding: Modified independent   Grooming: Min guard;Standing;Wash/dry hands Grooming Details (indicate cue type and reason): at sink Upper Body Bathing: Minimal assistance;Sitting   Lower Body Bathing: Moderate assistance;Sitting/lateral leans   Upper Body Dressing : Minimal assistance;Sitting   Lower Body Dressing: Moderate assistance;Sit to/from stand   Toilet Transfer: Minimal assistance;Ambulation;RW;Regular Toilet;Grab bars Toilet Transfer Details (indicate cue type and reason): min A for boost and balance Toileting- Clothing Manipulation and Hygiene: Minimal assistance;Sit to/from stand Toileting - Clothing Manipulation Details (indicate cue type and reason): min A for sit<>stand and balance during peri care     Functional mobility during ADLs: Min guard;Rolling walker General ADL Comments: decreased activity tolerance, safety awareness, hx of falling     Vision Baseline Vision/History: Wears glasses Patient Visual Report: No change from baseline       Perception     Praxis  Pertinent Vitals/Pain Pain Assessment: No/denies pain     Hand Dominance Right   Extremity/Trunk Assessment Upper Extremity Assessment Upper Extremity Assessment:  Generalized weakness(bruising)   Lower Extremity Assessment Lower Extremity Assessment: Defer to PT evaluation   Cervical / Trunk Assessment Cervical / Trunk Assessment: Kyphotic   Communication Communication Communication: No difficulties   Cognition Arousal/Alertness: Awake/alert Behavior During Therapy: WFL for tasks assessed/performed Overall Cognitive Status: Impaired/Different from baseline Area of Impairment: Memory;Safety/judgement                     Memory: Decreased short-term memory   Safety/Judgement: Decreased awareness of deficits;Decreased awareness of safety     General Comments: Pt moves quickly borderline impulsive and unaware of lines   General Comments       Exercises     Shoulder Instructions      Home Living Family/patient expects to be discharged to:: Private residence Living Arrangements: Alone Available Help at Discharge: Family;Available PRN/intermittently(live nearby sister and brother in law) Type of Home: House Home Access: Level entry     Home Layout: Two level;Able to live on main level with bedroom/bathroom Alternate Level Stairs-Number of Steps: does not go upstairs   Bathroom Shower/Tub: Walk-in shower(zero entry)   Bathroom Toilet: Handicapped height Bathroom Accessibility: Yes   Home Equipment: Environmental consultant - 2 wheels;Cane - single point;Shower seat;Shower seat - built in;Walker - 4 wheels;Toilet riser(rails on the bed)   Additional Comments: still drives, has a gym in his garage      Prior Functioning/Environment Level of Independence: Independent with assistive device(s)        Comments: started using a RW this past week, has a maid that comes every other week and yard service.         OT Problem List: Decreased activity tolerance;Impaired balance (sitting and/or standing);Decreased cognition;Decreased safety awareness;Increased edema      OT Treatment/Interventions: Self-care/ADL training;Energy  conservation;Therapeutic activities;Patient/family education;Balance training    OT Goals(Current goals can be found in the care plan section) Acute Rehab OT Goals Patient Stated Goal: to stop falling OT Goal Formulation: With patient Time For Goal Achievement: 03/14/19 Potential to Achieve Goals: Good ADL Goals Pt Will Perform Grooming: with modified independence;standing Pt Will Perform Upper Body Dressing: with modified independence;sitting Pt Will Perform Lower Body Dressing: with modified independence;sit to/from stand Pt Will Transfer to Toilet: with modified independence;ambulating Pt Will Perform Toileting - Clothing Manipulation and hygiene: with modified independence;sit to/from stand  OT Frequency: Min 2X/week   Barriers to D/C: Decreased caregiver support  Pt lives alone       Co-evaluation              AM-PAC OT "6 Clicks" Daily Activity     Outcome Measure Help from another person eating meals?: None Help from another person taking care of personal grooming?: A Little Help from another person toileting, which includes using toliet, bedpan, or urinal?: A Little Help from another person bathing (including washing, rinsing, drying)?: A Little Help from another person to put on and taking off regular upper body clothing?: A Little Help from another person to put on and taking off regular lower body clothing?: A Little 6 Click Score: 19   End of Session Equipment Utilized During Treatment: Gait belt;Rolling walker Nurse Communication: Mobility status;Other (comment)(condom cath off, IV occluded)  Activity Tolerance: Patient tolerated treatment well Patient left: in bed;with call bell/phone within reach  OT Visit Diagnosis: Unsteadiness on feet (R26.81);Other abnormalities of gait and mobility (  R26.89);Repeated falls (R29.6);History of falling (Z91.81);Muscle weakness (generalized) (M62.81);Other symptoms and signs involving cognitive function                Time:  1711-1740 OT Time Calculation (min): 29 min Charges:  OT General Charges $OT Visit: 1 Visit OT Evaluation $OT Eval Moderate Complexity: 1 Mod OT Treatments $Self Care/Home Management : 8-22 mins  Hulda Humphrey OTR/L Acute Rehabilitation Services Pager: [phone] Office: [place] 02/28/2019, 6:23 PM

## 2019-02-28 NOTE — Op Note (Signed)
[facility] Patient Name: Darryl Diaz Procedure Date : 02/28/2019 MRN: [id number] Attending MD: Milus Banister , MD Date of Birth: 1940/06/23 CSN: [id number] Age: 78 Admit Type: Inpatient Procedure:                Upper GI endoscopy Indications:              iron deficiency anemia on eliquis, no overt bleeding Providers:                Milus Banister, MD, Jobe Igo, RN,                            William Dalton, Technician Referring MD:              Medicines:                Monitored Anesthesia Care Complications:            No immediate complications. Estimated blood loss:                            None. Estimated Blood Loss:     Estimated blood loss: none. Procedure:                Pre-Anesthesia Assessment:                           - Prior to the procedure, a History and Physical                            was performed, and patient medications and                            allergies were reviewed. The patient's tolerance of                            previous anesthesia was also reviewed. The risks                            and benefits of the procedure and the sedation                            options and risks were discussed with the patient.                            All questions were answered, and informed consent                            was obtained. Prior Anticoagulants: The patient has                            taken Eliquis (apixaban), last dose was 3 days                            prior to procedure. ASA Grade Assessment: III - A  patient with severe systemic disease. After                            reviewing the risks and benefits, the patient was                            deemed in satisfactory condition to undergo the                            procedure.                           After obtaining informed consent, the endoscope was                            passed under direct vision. Throughout  the                            procedure, the patient's blood pressure, pulse, and                            oxygen saturations were monitored continuously. The                            GIF-H190 ([id number] Olympus gastroscope was                            introduced through the mouth, and advanced to the                            second part of duodenum. The upper GI endoscopy was                            accomplished without difficulty. The patient                            tolerated the procedure well. Scope In: Scope Out: Findings:      LA Grade A GE junction esophagitis. Slight linear eschar present, this       may have been a site of a recent MW tear, bleeding.      Moderate inflammation characterized by erosions (1-6mm erosion in the       antrum, see images), erythema and granularity was found in the entire       examined stomach. Biopsies were taken with a cold forceps for histology.      The exam was otherwise without abnormality. Impression:               - Moderate non-specific pangastritis. May be                            related to alcohol. Biopsies taken to check for H.                            pylori.                           -  LA Grade A GE junction esophagitis. Slight linear                            eschar present, this may have been a site of a                            recent MW tear, bleeding. Recommendation:           - Return patient to hospital ward for ongoing care.                           - OK to resume solid food. OK to resume blood                            thinner.                           - Will increase PPI to twice daily for now.                           - No plan for further GI testing at this point. Procedure Code(s):        --- Professional ---                           [phone], Esophagogastroduodenoscopy, flexible,                            transoral; with biopsy, single or multiple Diagnosis Code(s):        --- Professional ---                            K29.70, Gastritis, unspecified, without bleeding                           D50.9, Iron deficiency anemia, unspecified CPT copyright 2019 [employer]. All rights reserved. The codes documented in this report are preliminary and upon coder review may  be revised to meet current compliance requirements. Milus Banister, MD 02/28/2019 8:55:03 AM This report has been signed electronically. Number of Addenda: 0

## 2019-02-28 NOTE — Interval H&P Note (Signed)
History and Physical Interval Note:  02/28/2019 7:31 AM  Darryl Diaz  has presented today for surgery, with the diagnosis of IDA.  The various methods of treatment have been discussed with the patient and family. After consideration of risks, benefits and other options for treatment, the patient has consented to  Procedure(s): ESOPHAGOGASTRODUODENOSCOPY (EGD) WITH PROPOFOL (N/A) as a surgical intervention.  The patient's history has been reviewed, patient examined, no change in status, stable for surgery.  I have reviewed the patient's chart and labs.  Questions were answered to the patient's satisfaction.     Milus Banister

## 2019-02-28 NOTE — Progress Notes (Signed)
OT Cancellation Note  Patient Details Name: Darryl Diaz MRN: [id number] DOB: [birth year]   Cancelled Treatment:    Reason Eval/Treat Not Completed: Patient at procedure or test/ unavailable(Endoscopy)  Merri Ray Mao Lockner 02/28/2019, 9:00 AM

## 2019-02-28 NOTE — Transfer of Care (Signed)
Immediate Anesthesia Transfer of Care Note  Patient: Darryl Diaz  Procedure(s) Performed: ESOPHAGOGASTRODUODENOSCOPY (EGD) WITH PROPOFOL (N/A ) BIOPSY  Patient Location: Endoscopy Unit  Anesthesia Type:MAC  Level of Consciousness: awake, alert  and oriented  Airway & Oxygen Therapy: Patient Spontanous Breathing and Patient connected to nasal cannula oxygen  Post-op Assessment: Report given to RN and Post -op Vital signs reviewed and stable  Post vital signs: Reviewed and stable  Last Vitals:  Vitals Value Taken Time  BP 99/45 02/28/19 0851  Temp    Pulse 26 02/28/19 0851  Resp 23 02/28/19 0851  SpO2 100 % 02/28/19 0851  Vitals shown include unvalidated device data.  Last Pain:  Vitals:   02/28/19 0828  TempSrc:   PainSc: 0-No pain         Complications: No apparent anesthesia complications

## 2019-03-01 ENCOUNTER — Encounter (HOSPITAL_COMMUNITY): Payer: Self-pay | Admitting: Gastroenterology

## 2019-03-01 DIAGNOSIS — W19XXXA Unspecified fall, initial encounter: Secondary | ICD-10-CM

## 2019-03-01 LAB — CBC
HCT: 27.1 % — ABNORMAL LOW (ref 39.0–52.0)
Hemoglobin: 8.9 g/dL — ABNORMAL LOW (ref 13.0–17.0)
MCH: 32.1 pg (ref 26.0–34.0)
MCHC: 32.8 g/dL (ref 30.0–36.0)
MCV: 97.8 fL (ref 80.0–100.0)
Platelets: 143 10*3/uL — ABNORMAL LOW (ref 150–400)
RBC: 2.77 MIL/uL — ABNORMAL LOW (ref 4.22–5.81)
RDW: 18.6 % — ABNORMAL HIGH (ref 11.5–15.5)
WBC: 6.8 10*3/uL (ref 4.0–10.5)
nRBC: 0 % (ref 0.0–0.2)

## 2019-03-01 LAB — COMPREHENSIVE METABOLIC PANEL
ALT: 45 U/L — ABNORMAL HIGH (ref 0–44)
AST: 70 U/L — ABNORMAL HIGH (ref 15–41)
Albumin: 2.8 g/dL — ABNORMAL LOW (ref 3.5–5.0)
Alkaline Phosphatase: 98 U/L (ref 38–126)
Anion gap: 14 (ref 5–15)
BUN: 16 mg/dL (ref 8–23)
CO2: 23 mmol/L (ref 22–32)
Calcium: 8.5 mg/dL — ABNORMAL LOW (ref 8.9–10.3)
Chloride: 95 mmol/L — ABNORMAL LOW (ref 98–111)
Creatinine, Ser: 1.25 mg/dL — ABNORMAL HIGH (ref 0.61–1.24)
GFR calc Af Amer: >60 mL/min (ref >60)
GFR calc non Af Amer: 55 mL/min — ABNORMAL LOW (ref >60)
Glucose, Bld: 133 mg/dL — ABNORMAL HIGH (ref 70–99)
Potassium: 3.6 mmol/L (ref 3.5–5.1)
Sodium: 132 mmol/L — ABNORMAL LOW (ref 135–145)
Total Bilirubin: 1.7 mg/dL — ABNORMAL HIGH (ref 0.3–1.2)
Total Protein: 5.3 g/dL — ABNORMAL LOW (ref 6.5–8.1)

## 2019-03-01 LAB — SURGICAL PATHOLOGY

## 2019-03-01 MED ORDER — APIXABAN 5 MG PO TABS
5.0000 mg | ORAL_TABLET | Freq: Two times a day (BID) | ORAL | Status: DC
  Administered 2019-03-01 – 2019-03-05 (×9): 5 mg via ORAL
  Filled 2019-03-01 (×9): qty 1

## 2019-03-01 MED ORDER — DILTIAZEM HCL ER COATED BEADS 180 MG PO CP24
360.0000 mg | ORAL_CAPSULE | Freq: Every day | ORAL | Status: DC
  Administered 2019-03-01 – 2019-03-05 (×5): 360 mg via ORAL
  Filled 2019-03-01 (×5): qty 2

## 2019-03-01 NOTE — Progress Notes (Signed)
Progress Note  Patient Name: Darryl Diaz Date of Encounter: 03/01/2019  Primary Cardiologist: Sherren Mocha, MD   Subjective   Feeling well.  No complaints.  Ambulated without dyspnea.   Inpatient Medications    Scheduled Meds: . atorvastatin  20 mg Oral Daily  . escitalopram  10 mg Oral Daily  . ferrous sulfate  325 mg Oral Daily  . folic acid  1 mg Oral Daily  . furosemide  20 mg Intravenous BID  . loratadine  10 mg Oral Daily  . magnesium oxide  400 mg Oral BID  . metoprolol  200 mg Oral Daily  . miconazole nitrate   Topical TID  . multivitamin with minerals  1 tablet Oral Daily  . pantoprazole  40 mg Oral BID AC  . potassium chloride  20 mEq Oral Daily  . silver sulfADIAZINE   Topical BID  . tamsulosin  0.4 mg Oral Daily  . vitamin B-1  250 mg Oral Daily  . traZODone  50 mg Oral QHS   Continuous Infusions: . amiodarone 30 mg/hr (03/01/19 0300)  . diltiazem (CARDIZEM) infusion 15 mg/hr (03/01/19 0300)   PRN Meds: acetaminophen   Vital Signs    Vitals:   02/28/19 1900 02/28/19 2330 03/01/19 0410 03/01/19 0820  BP: 116/61 101/66 116/74 (!) 130/98  Pulse: 94 87 66 98  Resp: (!) 27 (!) 23 20 19   Temp: 97.8 F (36.6 C) 98.6 F (37 C) 98.4 F (36.9 C) 98.7 F (37.1 C)  TempSrc: Axillary Oral Oral Oral  SpO2: 98% 99% 97% 99%  Weight:   115.4 kg   Height:        Intake/Output Summary (Last 24 hours) at 03/01/2019 0847 Last data filed at 03/01/2019 6294 Gross per 24 hour  Intake 1773.09 ml  Output 1620 ml  Net 153.09 ml   Last 3 Weights 03/01/2019 02/28/2019 02/27/2019  Weight (lbs) 254 lb 6.6 oz 242 lb 8.1 oz 253 lb 1.4 oz  Weight (kg) 115.4 kg 110 kg 114.8 kg      Telemetry    Atrial fibrillation.  Rate mostly <100 bpm - Personally Reviewed  ECG    n/a - Personally Reviewed  Physical Exam   VS:  BP (!) 130/98 (BP Location: Right Arm)   Pulse 98   Temp 98.7 F (37.1 C) (Oral)   Resp 19   Ht 6' (1.829 m)   Wt 115.4 kg   SpO2 99%    BMI 34.50 kg/m  , BMI Body mass index is 34.5 kg/m. GENERAL:  Well appearing HEENT: Pupils equal round and reactive, fundi not visualized, oral mucosa unremarkable NECK:  No jugular venous distention, waveform within normal limits, carotid upstroke brisk and symmetric, no bruits, no thyromegaly LYMPHATICS:  No cervical adenopathy LUNGS:  Clear to auscultation bilaterally HEART:   Irregularly irregular.  PMI not displaced or sustained,S1 and S2 within normal limits, no S3, no S4, no clicks, no rubs,  murmurs ABD:  Flat, positive bowel sounds normal in frequency in pitch, no bruits, no rebound, no guarding, no midline pulsatile mass, no hepatomegaly, no splenomegaly EXT:  2 plus pulses throughout, 1+ LE edema, no cyanosis no clubbing SKIN:  No rashes no nodules NEURO:  Cranial nerves II through XII grossly intact, motor grossly intact throughout PSYCH:  Cognitively intact, oriented to person place and time   Labs    High Sensitivity Troponin:  No results for input(s): TROPONINIHS in the last 720 hours.    Chemistry Recent Labs  Lab 02/26/19 0557 02/27/19 0443 02/28/19 0314 03/01/19 0226  NA 129* 133* 127* 132*  K 3.3* 3.5 3.2* 3.6  CL 92* 96* 88* 95*  CO2 25 25 24 23   GLUCOSE 108* 114* 396* 133*  BUN 9 8 10 16   CREATININE 1.32* 1.31* 1.24 1.25*  CALCIUM 8.6* 8.4* 7.6* 8.5*  PROT 5.3*  --  5.1* 5.3*  ALBUMIN 3.0*  --  2.7* 2.8*  AST 93*  --  81* 70*  ALT 56*  --  49* 45*  ALKPHOS 107  --  98 98  BILITOT 2.6*  --  2.0* 1.7*  GFRNONAA 52* 52* 56* 55*  GFRAA 60* >60 >60 >60  ANIONGAP 12 12 15 14      Hematology Recent Labs  Lab 02/27/19 0443 02/28/19 0314 03/01/19 0226  WBC 6.6 6.5 6.8  RBC 2.64* 2.68* 2.77*  HGB 8.6* 8.6* 8.9*  HCT 25.1* 26.1* 27.1*  MCV 95.1 97.4 97.8  MCH 32.6 32.1 32.1  MCHC 34.3 33.0 32.8  RDW 17.8* 18.5* 18.6*  PLT 116* 133* 143*    BNP Recent Labs  Lab 02/25/19 1735  BNP 209.0*     DDimer No results for input(s): DDIMER in the  last 168 hours.   Radiology    No results found.  Cardiac Studies   Echo 02/26/19: IMPRESSIONS  1. Left ventricular ejection fraction, by visual estimation, is 60 to 65%. The left ventricle has normal function. Normal left ventricular size. There is no left ventricular hypertrophy.  2. Global right ventricle has normal systolic function.The right ventricular size is normal. No increase in right ventricular wall thickness.  3. Left atrial size was mildly dilated.  4. Right atrial size was normal.  5. Mild mitral annular calcification.  6. Mild thickening of the anterior and posterior mitral valve leaflet(s).  7. Mild to moderate aortic valve annular calcification.  8. The mitral valve is normal in structure. No evidence of mitral valve regurgitation. No evidence of mitral stenosis.  9. The tricuspid valve is normal in structure. Tricuspid valve regurgitation is trivial. 10. The aortic valve is tricuspid Aortic valve regurgitation was not visualized by color flow Doppler. Mild to moderate aortic valve sclerosis/calcification without any evidence of aortic stenosis. 11. The pulmonic valve was normal in structure. Pulmonic valve regurgitation is trivial by color flow Doppler. 12. Aortic dilatation noted. 13. There is mild dilatation of the aortic root measuring 38 mm. 14. Moderately elevated pulmonary artery systolic pressure. 15. The inferior vena cava is normal in size with greater than 50% respiratory variability, suggesting right atrial pressure of 3 mmHg.  Patient Profile     [name] is a 53M with PAF, hyeprtension, hyperlipidemia, GERD, prior EtOH abuse admitted with GI bleed and falls.   Assessment & Plan    # Atrial fibrillation with RVR: Heart rate is better controlled.  We will switch to oral diltiazem. Continue metoprolol and amiodarone. OK to resume Eliquis today per GI.  Will continue to monitor H/H.  If rates remain controlled on oral diltiazem will attempt to  discontinue amiodarone.    # GI bleed: Went for EGD on 10/22 and had a Mallory Weiss tear and erosions/erythema in stomach.  Concern for EtOH related gastritis.  He was deemed stable to resume anticoagulation and PPI was increased.   # Hypertension: Blood pressure has been labile.  BP control is focused on rate management with metoprolol and diltiazem.   # Acute on chronic diastolic heart failure: Breathing is improving.  Renal function improving with diuresis.  Continue lasix 20mg  IV bid. BP control as above.       For questions or updates, please contact [place] Please consult [email or web address] for contact info under        Signed, Skeet Latch, MD  03/01/2019, 8:47 AM

## 2019-03-01 NOTE — Progress Notes (Addendum)
Physical Therapy Treatment Patient Details Name: Darryl Diaz MRN: [id number] DOB: 03-07-1941 Today's Date: 03/01/2019    History of Present Illness 78 y.o. male admitted on 02/25/19 for low Hgb sent in by his PCP.  Hgb 6.9.  Pt found to have acute CHF exacerbation with bil LE swelling, holding eliquis due to 3 recent falls (h/o PAF), hyponatremia, frequent falls wiht head CT negative, multiple bruised/scrapes in all extremities, elevated LFTs.  Pt with other significant PMH of OA, HTN, insomnia, colon polyps, h/o ETOH use in remission, L hip closed reduction, L hip arthroplasty, L ankle surgery.      PT Comments    Patient seen for mobility progression. Pt is making progress toward PT goals. Continue to progress as tolerated with anticipated d/c to SNF for further skilled PT services.    Follow Up Recommendations  SNF;Supervision/Assistance - 24 hour     Equipment Recommendations  None recommended by PT    Recommendations for Other Services       Precautions / Restrictions Precautions Precautions: Fall Precaution Comments: 3 falls in the past week, before that only one in the past year.  Restrictions Weight Bearing Restrictions: No    Mobility  Bed Mobility Overal bed mobility: Needs Assistance Bed Mobility: Supine to Sit;Sit to Supine     Supine to sit: Supervision Sit to supine: Supervision   General bed mobility comments: supervision for safety  Transfers Overall transfer level: Needs assistance Equipment used: Rolling walker (2 wheeled) Transfers: Sit to/from Stand Sit to Stand: Min guard;Min assist         General transfer comment: assist to power up into standing; cues for safe hand placement  Ambulation/Gait Ambulation/Gait assistance: Min guard Gait Distance (Feet): (70 ft X 2 trials with standing and seated rest break) Assistive device: Rolling walker (2 wheeled) Gait Pattern/deviations: Step-through pattern;Decreased step length - right;Decreased  step length - left Gait velocity: decr   General Gait Details: cues for PLB and increased bilat step lengths    Stairs             Wheelchair Mobility    Modified Rankin (Stroke Patients Only)       Balance Overall balance assessment: Needs assistance Sitting-balance support: Feet supported;Bilateral upper extremity supported Sitting balance-Leahy Scale: Good     Standing balance support: Bilateral upper extremity supported;Single extremity supported Standing balance-Leahy Scale: Poor                              Cognition Arousal/Alertness: Awake/alert Behavior During Therapy: WFL for tasks assessed/performed Overall Cognitive Status: Within Functional Limits for tasks assessed                                 General Comments: Pt moves quickly borderline impulsive and unaware of lines      Exercises General Exercises - Lower Extremity Ankle Circles/Pumps: AROM;Both;Seated Long Arc Quad: AROM;Both;Seated Hip Flexion/Marching: AROM;Both;Seated    General Comments General comments (skin integrity, edema, etc.): pt SOB with mobility; VSS      Pertinent Vitals/Pain Pain Assessment: No/denies pain    Home Living                      Prior Function            PT Goals (current goals can now be found in the care plan section) Progress towards  PT goals: Progressing toward goals    Frequency    Min 2X/week      PT Plan Current plan remains appropriate    Co-evaluation              AM-PAC PT "6 Clicks" Mobility   Outcome Measure  Help needed turning from your back to your side while in a flat bed without using bedrails?: A Little Help needed moving from lying on your back to sitting on the side of a flat bed without using bedrails?: A Little Help needed moving to and from a bed to a chair (including a wheelchair)?: A Little Help needed standing up from a chair using your arms (e.g., wheelchair or bedside  chair)?: A Little Help needed to walk in hospital room?: A Little Help needed climbing 3-5 steps with a railing? : A Lot 6 Click Score: 17    End of Session Equipment Utilized During Treatment: Gait belt Activity Tolerance: Patient tolerated treatment well Patient left: in bed;with call bell/phone within reach;Other (comment);with bed alarm set(bed in chair position) Nurse Communication: Mobility status PT Visit Diagnosis: Unsteadiness on feet (R26.81);Muscle weakness (generalized) (M62.81);Difficulty in walking, not elsewhere classified (R26.2);History of falling (Z91.81)     Time: 6378-5885 PT Time Calculation (min) (ACUTE ONLY): 26 min  Charges:  $Gait Training: 8-22 mins $Therapeutic Exercise: 8-22 mins                     Earney Navy, PTA Acute Rehabilitation Services Pager: [phone] Office: [phone]     Darliss Cheney 03/01/2019, 4:38 PM

## 2019-03-01 NOTE — TOC Initial Note (Signed)
Transition of Care [facility]) - Initial/Assessment Note    Patient Details  Name: Tai Skelly MRN: [id number] Date of Birth: 18-Apr-1941  [facility]) CM/SW Contact:    Alberteen Sam, [place] Phone Number: [phone] 03/01/2019, 11:22 AM  Clinical Narrative:                  CSW met with patient for discharge planning and discuss recommendation of SNF. Patient reports preference for [facility] as he has been there twice before, CSW informed him that they are at max capacity but CSW can check on any availability come Monday when patient is expected to be discharge. Patient agreeable to be faxed out to other facilities as back up if [facility] unable to accept.   Expected Discharge Plan: Skilled Nursing Facility Barriers to Discharge: Continued Medical Work up   Patient Goals and CMS Choice   CMS Medicare.gov Compare Post Acute Care list provided to:: Patient Choice offered to / list presented to : Patient  Expected Discharge Plan and Services Expected Discharge Plan: [place] Acute Care Choice: [place] arrangements for the past 2 months: Single Family Home                                      Prior Living Arrangements/Services Living arrangements for the past 2 months: Single Family Home Lives with:: Self Patient language and need for interpreter reviewed:: Yes Do you feel safe going back to the place where you live?: No   needs short term rehab  Need for Family Participation in Patient Care: No (Comment) Care giver support system in place?: Yes (comment)   Criminal Activity/Legal Involvement Pertinent to Current Situation/Hospitalization: No - Comment as needed  Activities of Daily Living Home Assistive Devices/Equipment: Walker (specify type) ADL Screening (condition at time of admission) Patient's cognitive ability adequate to safely complete daily activities?: Yes Is the patient deaf or have difficulty  hearing?: No Does the patient have difficulty seeing, even when wearing glasses/contacts?: No Does the patient have difficulty concentrating, remembering, or making decisions?: No Patient able to express need for assistance with ADLs?: Yes Does the patient have difficulty dressing or bathing?: No Independently performs ADLs?: Yes (appropriate for developmental age) Does the patient have difficulty walking or climbing stairs?: Yes Weakness of Legs: Both Weakness of Arms/Hands: Both  Permission Sought/Granted Permission sought to share information with : Case Manager, Chartered certified accountant granted to share information with : Yes, Verbal Permission Granted     Permission granted to share info w AGENCY: SNFs        Emotional Assessment Appearance:: Appears stated age Attitude/Demeanor/Rapport: Gracious Affect (typically observed): Calm Orientation: : Oriented to Self, Oriented to Place, Oriented to  Time, Oriented to Situation Alcohol / Substance Use: Not Applicable Psych Involvement: No (comment)  Admission diagnosis:  Shortness of breath [R06.02] Elevated LFTs [R79.89] Bilateral leg edema [R60.0] Anemia, unspecified type [D64.9] Patient Active Problem List   Diagnosis Date Noted  . Atrial fibrillation with rapid ventricular response ([place])   . GI bleed 02/25/2019  . Falls 02/25/2019  . Bilateral leg edema 02/25/2019  . Hyponatremia 02/25/2019  . Acute posthemorrhagic anemia 08/11/2018  . Symptomatic anemia 08/11/2018  . Hx of adenomatous colonic polyps 08/10/2017  . PAF (paroxysmal atrial fibrillation) ([place]) 02/05/2017  . Dislocation of hip, posterior, left, closed ([place]) 04/30/2016  . Hip  fracture ([place]) 03/30/2016  . Hypertension 03/30/2016  . Hyperlipidemia 03/30/2016  . GERD (gastroesophageal reflux disease) 03/30/2016  . Osteoarthritis 03/30/2016  . Depression 03/30/2016  . Rhabdomyolysis 03/30/2016  . Leukocytosis 03/30/2016  . Pressure injury  of skin 03/30/2016  . Closed fracture of left hip [facility])    PCP:  Tisovec, [name], MD Pharmacy:   [facility] [address] [place]), [place] [address] [place]) [place] [address] Phone: [phone] Fax: [phone]     Social Determinants of Health (SDOH) Interventions    Readmission Risk Interventions No flowsheet data found.

## 2019-03-01 NOTE — Progress Notes (Signed)
ANTICOAGULATION CONSULT NOTE - Initial Consult  Pharmacy Consult for apixaban Indication: atrial fibrillation  Allergies  Allergen Reactions  . Penicillins Other (See Comments)    Did it involve swelling of the face/tongue/throat, SOB, or low BP? Unknown Did it involve sudden or severe rash/hives, skin peeling, or any reaction on the inside of your mouth or nose? Unknown Did you need to seek medical attention at a hospital or doctor's office? Unknown When did it last happen?childhood reaction If all above answers are "NO", may proceed with cephalosporin use.    Patient Measurements: Height: 6' (182.9 cm) Weight: 254 lb 6.6 oz (115.4 kg) IBW/kg (Calculated) : 77.6   Vital Signs: Temp: 98.7 F (37.1 C) (10/23 0820) Temp Source: Oral (10/23 0820) BP: 130/98 (10/23 0820) Pulse Rate: 98 (10/23 0820)  Labs: Recent Labs    02/27/19 0443 02/28/19 0314 03/01/19 0226  HGB 8.6* 8.6* 8.9*  HCT 25.1* 26.1* 27.1*  PLT 116* 133* 143*  CREATININE 1.31* 1.24 1.25*    Estimated Creatinine Clearance: 64.9 mL/min (A) (by C-G formula based on SCr of 1.25 mg/dL (H)).   Medical History: Past Medical History:  Diagnosis Date  . 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 Atrial fibrillation ([place])   . Fall at home 03/28/2016  . GERD (gastroesophageal reflux disease)   . H/O seasonal allergies   . Hx of adenomatous colonic polyps 08/10/2017  . Hyperlipidemia   . Hypertension   . Insomnia   . 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 OA (osteoarthritis)    hands     Assessment: 20 yoF on apixaban PTA for hx AFib admitted with AFib c/b GIB. Pt now s/p upper EGD with MW tear. Per GI - ok to resume apixaban today.  Goal of Therapy:  Monitor platelets by anticoagulation protocol: Yes   Plan:  -Apixaban 5mg  BID -Pharmacy will sign off, reconsult if needed   Arrie Senate, PharmD, BCPS Clinical Pharmacist [phone] Please check AMION for  all [facility] Pharmacy numbers 03/01/2019

## 2019-03-01 NOTE — Progress Notes (Signed)
PROGRESS NOTE    Darryl Diaz  BSJ:[id number] DOB: 05-18-1940 DOA: 02/25/2019 PCP: [name], MD   Brief Narrative: 52yom w PAF on eliquis ,depression,GERD, history of colon polyps, hypertension, hyperlipidemia, insomnia, depression, osteoarthritis presenting to the ED for evaluation oflow hemoglobin, recent falls, and bilateral lower extremity edema blood work was done by PCP and was told his hemoglobin dropped to 6.0 from baseline of 11.0, 8 months ago. He also reported that he lost his balance and had 3 falls last week. And has worsening bilateral lower extremity edema, dyspnea on exertion for the past 1 month. In ER,hypoxic to the 80s on RA, with extensive bruising in left arm right lower leg, lower extremities swelling, hemoglobin was 6.9, digital rectal exam was positive-Eliquis was held, x-ray was unremarkab. GI  Consulted. He is s/p 2 unit PRBC, on lasix iv.  10/21-was going down for endoscopy but found to be in A. fib with RVR in 140s, once for 2 PCU on Cardizem drip cardiology consulted added amiodarone drip 10/22-status post EGD still in RVR  Subjective: No acute events overnight.  Heart rate fairly stable in A. fib on Cardizem and amiodarone drip Resting, no complaints, feels well.  Assessment & Plan:  Acute blood loss anemia with hem + stool- suspect multifactorial with gi bleed, dilutional from edema, blood loss from burising in extremities in the setting on eliquis- on hold for now, s/p 2 units, hb stable increased appropriately.  Status post  EGD- "Moderate non-specific pangastritis. May be related to alcohol. Biopsies taken to check for H. pylori.LA Grade A GE junction esophagitis. Slight linear eschar present, this may have been a site of a recent MW tear" okay to resume anticoagulation as per GI. 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 ON PPI 40MG  DAILY.  Symptomatic anemia-s/p 2 unit prbc as above.  hb stable. Recent Labs  Lab 02/26/19 0557 02/26/19  1616 02/27/19 0443 02/28/19 0314 03/01/19 0226  HGB 6.9* 8.4* 8.6* 8.6* 8.9*  HCT 20.4* 24.0* 25.1* 26.1* 27.1*   PAF-with RVR since 10/21 remains on amiodarone drip and Cardizem drip followed by cardiology.  Also on metoprolol 200 MG daily.  CHA2DS2-VASc score 3.  GI is okay to resume anticoagulation, defer to cardiology-resuming Eliquis today- switching to po cardizem and wean amio gtt.  Falls at home- likely worsened by anemia/leg edema. Pt/to eval to continue and advised SNF. Ct head on admission neg.b12 stable. cont Pt-ot and SNF advised  Bilateral lower extremity edema w DOE/SOB, suspecting acute CHF W preserved lvef.Cont on on IV LASIX.  So far -1.2 L balance.Weight at 254 pounds, on admission to 263.6  Hyponatremia hypoervolemic: from fluid overload- improving on lasix. Cont diuresis and monitor bmp Recent Labs  Lab 02/25/19 1729 02/26/19 0557 02/27/19 0443 02/28/19 0314 03/01/19 0226  NA 124* 129* 133* 127* 132*   CKD stage III a: Creat stable at 1.2. monitor Recent Labs  Lab 02/25/19 1729 02/26/19 0557 02/27/19 0443 02/28/19 0314 03/01/19 0226  BUN 10 9 8 10 16   CREATININE 1.35* 1.32* 1.31* 1.24 1.25*   Hypokalemia resolved.  Hypomagnesemia: was repleted continue on magnesium oxide as well  Mild thrombocytopenia-stable,monitor   Mild transaminitis: ast/alt/alp/tb- 93/56/107/2.6-LFTs improving.Has alcohol abuse history, I wonder if he had liver cirrhosis also contributing to his leg edema. Ct abdomen in 08/2018 showed hepatic steatosis.  Hyperglycemia blood sugar noted to be 396 this morning.  Check hemoglobin A1c.   DVT prophylaxis: SCD Code Status: full Family Communication: plan of care discussed with patient  in detail.  Disposition Plan: Remains inpatient pending further improvement in his RVR.  PT advised skilled nursing facility. Sw consulted. anticipate SNF tomorrow if available.  Consultants: gi,cardio  Procedures: EGD 10/22 Moderate non-specific  pangastritis. May be related to alcohol. Biopsies taken to check for H. pylori. LA Grade A GE junction esophagitis. Slight linear eschar present, this may have been a site of a recent MW tear, bleeding. Impression: - Return patient to hospital ward for ongoing care. - OK to resume solid food. OK to resume blood thinner.  TTE 1. Left ventricular ejection fraction, by visual estimation, is 60 to 65%.The left ventricle has normal function. Normal left ventricular size. There is no left ventricular hypertrophy. 2. Global right ventricle has normal systolic function.The right ventricular size is normal. No increase in right ventricular wall thickness. 3. Left atrial size was mildly dilated. 4. Right atrial size was normal. 5. Mild mitral annular calcification. 6. Mild thickening of the anterior and posterior mitral valve leaflet(s). 7. Mild to moderate aortic valve annular calcification. 8. The mitral valve is normal in structure. No evidence of mitral valve regurgitation. No evidence of mitral stenosis. 9. The tricuspid valve is normal in structure. Tricuspid valve regurgitation is trivial. 10. The aortic valve is tricuspid Aortic valve regurgitation was not visualized by color flow Doppler. Mild to moderate aortic valve sclerosis/calcification without any evidence of aortic stenosis. 11. The pulmonic valve was normal in structure. Pulmonic valve regurgitation is trivial by color flow Doppler. 12. Aortic dilatation noted. 13. There is mild dilatation of the aortic root measuring 38 mm. 14. Moderately elevated pulmonary artery systolic pressure. 15. The inferior vena cava is normal in size with greater than 50% respiratory variability, suggesting right atrial pressure of 3 mmHg.  Microbiology:  Antimicrobials: Anti-infectives (From admission, onward)   None       Objective: Vitals:   02/28/19 1709 02/28/19 1900 02/28/19 2330 03/01/19 0410  BP: 108/67 116/61 101/66 116/74  Pulse:  94  87 66  Resp:  (!) 27 (!) 23 20  Temp: 98.4 F (36.9 C) 97.8 F (36.6 C) 98.6 F (37 C) 98.4 F (36.9 C)  TempSrc: Oral Axillary Oral Oral  SpO2: 100% 98% 99% 97%  Weight:    115.4 kg  Height:        Intake/Output Summary (Last 24 hours) at 03/01/2019 0740 Last data filed at 03/01/2019 0300 Gross per 24 hour  Intake 1733.09 ml  Output 1620 ml  Net 113.09 ml   Filed Weights   02/27/19 0152 02/28/19 0448 03/01/19 0410  Weight: 114.8 kg 110 kg 115.4 kg   Weight change: 5.4 kg  Body mass index is 34.5 kg/m.  Intake/Output from previous day: 10/22 0701 - 10/23 0700 In: 1733.1 [P.O.:960; I.V.:773.1] Out: 1620 [Urine:1620] Intake/Output this shift: No intake/output data recorded.  Examination:  General exam: Appears calm and comfortable NAD, on RA HEENT:PERRL,Oral mucosa moist, Ear/Nose normal on gross exam Respiratory system: Bilateral equal air entry, normal vesicular breath sounds, no wheezes or crackles  Cardiovascular system: S1 & S2 heard, irregularly irregular,no JVD,murmurs. Gastrointestinal system: Abdomen is  soft, non tender, non distended, BS+.  Nervous System:Alert and oriented. No focal neurological deficits/moving extremities, sensation intact. Extremities:  B/l pitting edema chronic appearing, no clubbing, distal peripheral pulses palpable. Skin: No rashes, lesions, no icterus.ecchymosis present on multiple sites left upper extremity right lower extremity right flank- clearing up. MSK: Normal muscle bulk,tone ,power  Medications:  Scheduled Meds: . atorvastatin  20 mg Oral Daily  .  escitalopram  10 mg Oral Daily  . ferrous sulfate  325 mg Oral Daily  . folic acid  1 mg Oral Daily  . furosemide  20 mg Intravenous BID  . loratadine  10 mg Oral Daily  . magnesium oxide  400 mg Oral BID  . metoprolol  200 mg Oral Daily  . miconazole nitrate   Topical TID  . multivitamin with minerals  1 tablet Oral Daily  . pantoprazole  40 mg Oral BID AC  . potassium  chloride  20 mEq Oral Daily  . silver sulfADIAZINE   Topical BID  . tamsulosin  0.4 mg Oral Daily  . vitamin B-1  250 mg Oral Daily  . traZODone  50 mg Oral QHS   Continuous Infusions: . amiodarone 30 mg/hr (03/01/19 0300)  . diltiazem (CARDIZEM) infusion 15 mg/hr (03/01/19 0300)    Data Reviewed: I have personally reviewed following labs and imaging studies  CBC: Recent Labs  Lab 02/25/19 1729 02/26/19 0557 02/26/19 1616 02/27/19 0443 02/28/19 0314 03/01/19 0226  WBC 8.0 4.9 5.9 6.6 6.5 6.8  NEUTROABS 5.9  --   --   --   --   --   HGB 6.9* 6.9* 8.4* 8.6* 8.6* 8.9*  HCT 19.7* 20.4* 24.0* 25.1* 26.1* 27.1*  MCV 97.5 94.9 92.7 95.1 97.4 97.8  PLT 117* 101* 112* 116* 133* 366*   Basic Metabolic Panel: Recent Labs  Lab 02/25/19 1729 02/25/19 1758 02/26/19 0557 02/27/19 0443 02/27/19 1618 02/28/19 0314 03/01/19 0226  NA 124*  --  129* 133*  --  127* 132*  K 3.4*  --  3.3* 3.5  --  3.2* 3.6  CL 89*  --  92* 96*  --  88* 95*  CO2 22  --  25 25  --  24 23  GLUCOSE 120*  --  108* 114*  --  396* 133*  BUN 10  --  9 8  --  10 16  CREATININE 1.35*  --  1.32* 1.31*  --  1.24 1.25*  CALCIUM 8.6*  --  8.6* 8.4*  --  7.6* 8.5*  MG  --  1.2*  --   --  1.2*  --   --    GFR: Estimated Creatinine Clearance: 64.9 mL/min (A) (by C-G formula based on SCr of 1.25 mg/dL (H)). Liver Function Tests: Recent Labs  Lab 02/25/19 1729 02/26/19 0557 02/28/19 0314 03/01/19 0226  AST 112* 93* 81* 70*  ALT 67* 56* 49* 45*  ALKPHOS 120 107 98 98  BILITOT 2.1* 2.6* 2.0* 1.7*  PROT 5.5* 5.3* 5.1* 5.3*  ALBUMIN 3.4* 3.0* 2.7* 2.8*   No results for input(s): LIPASE, AMYLASE in the last 168 hours. No results for input(s): AMMONIA in the last 168 hours. Coagulation Profile: No results for input(s): INR, PROTIME in the last 168 hours. Cardiac Enzymes: No results for input(s): CKTOTAL, CKMB, CKMBINDEX, TROPONINI in the last 168 hours. BNP (last 3 results) No results for input(s): PROBNP in  the last 8760 hours. HbA1C: Recent Labs    02/28/19 1100  HGBA1C 4.7*  CBG: No results for input(s): GLUCAP in the last 168 hours. Lipid Profile: Recent Labs    02/28/19 0314  CHOL 81  HDL 47  LDLCALC 29  TRIG 27  CHOLHDL 1.7   Thyroid Function Tests: Recent Labs    02/27/19 1618  TSH 1.737   Anemia Panel: No results for input(s): VITAMINB12, FOLATE, FERRITIN, TIBC, IRON, RETICCTPCT in the last 72 hours. Sepsis  Labs: No results for input(s): PROCALCITON, LATICACIDVEN in the last 168 hours.  Recent Results (from the past 240 hour(s))  SARS CORONAVIRUS 2 (TAT 6-24 HRS) Nasopharyngeal Nasopharyngeal Swab     Status: None   Collection Time: 02/25/19  7:18 PM   Specimen: Nasopharyngeal Swab  Result Value Ref Range Status   SARS Coronavirus 2 NEGATIVE NEGATIVE Final    Comment: (NOTE) SARS-CoV-2 target nucleic acids are NOT DETECTED. The SARS-CoV-2 RNA is generally detectable in upper and lower respiratory specimens during the acute phase of infection. Negative results do not preclude SARS-CoV-2 infection, do not rule out co-infections with other pathogens, and should not be used as the sole basis for treatment or other patient management decisions. Negative results must be combined with clinical observations, patient history, and epidemiological information. The expected result is Negative. Fact Sheet for Patients: SugarRoll.be Fact Sheet for Healthcare Providers: https://www.woods-mathews.com/ This test is not yet approved or cleared by the [place] FDA and  has been authorized for detection and/or diagnosis of SARS-CoV-2 by FDA under an Emergency Use Authorization (EUA). This EUA will remain  in effect (meaning this test can be used) for the duration of the COVID-19 declaration under Section 56 4(b)(1) of the Act, 21 U.S.C. section 360bbb-3(b)(1), unless the authorization is terminated or revoked sooner. Performed at  [facility], [place] [address] [place], [place] [address]     Radiology Studies: No results found.  LOS: 3 days   Time spent: More than 50% of that time was spent in counseling and/or coordination of care. Antonieta Pert, MD [facility] 03/01/2019, 7:40 AM

## 2019-03-01 NOTE — NC FL2 (Signed)
[place] MEDICAID FL2 LEVEL OF CARE SCREENING TOOL     IDENTIFICATION  Patient Name: Darryl Diaz Birthdate: 1940/05/25 Sex: male Admission Date (Current Location): 02/25/2019  [facility] and [place] Number:  Herbalist and Address:  [facility], [place] [address], [place], [place] [address]      Provider Number: [id number]  Attending Physician Name and Address:  Antonieta Pert, MD  Relative Name and Phone Number:  [name] (sister) [phone]    Current Level of Care: Hospital Recommended Level of Care: [place] Prior Approval Number:    Date Approved/Denied:   PASRR Number: [id number]  Discharge Plan: SNF    Current Diagnoses: Patient Active Problem List   Diagnosis Date Noted  . Atrial fibrillation with rapid ventricular response ([place])   . GI bleed 02/25/2019  . Falls 02/25/2019  . Bilateral leg edema 02/25/2019  . Hyponatremia 02/25/2019  . Acute posthemorrhagic anemia 08/11/2018  . Symptomatic anemia 08/11/2018  . Hx of adenomatous colonic polyps 08/10/2017  . PAF (paroxysmal atrial fibrillation) ([place]) 02/05/2017  . Dislocation of hip, posterior, left, closed ([place]) 04/30/2016  . Hip fracture ([place]) 03/30/2016  . Hypertension 03/30/2016  . Hyperlipidemia 03/30/2016  . GERD (gastroesophageal reflux disease) 03/30/2016  . Osteoarthritis 03/30/2016  . Depression 03/30/2016  . Rhabdomyolysis 03/30/2016  . Leukocytosis 03/30/2016  . Pressure injury of skin 03/30/2016  . Closed fracture of left hip (HCC)     Orientation RESPIRATION BLADDER Height & Weight     Self, Time, Situation, Place  Normal Incontinent, External catheter Weight: 254 lb 6.6 oz (115.4 kg) Height:  6' (182.9 cm)  BEHAVIORAL SYMPTOMS/MOOD NEUROLOGICAL BOWEL NUTRITION STATUS      Continent Diet(see discharge summary)  AMBULATORY STATUS COMMUNICATION OF NEEDS Skin   Limited Assist Verbally Other (Comment), Skin abrasions(abrasion right and left  knees, blister left upper arm anterior, ecchymosis left arm and right hand, MASD right groin, left perineum, weeping left arm)                       Personal Care Assistance Level of Assistance  Bathing, Feeding, Dressing, Total care Bathing Assistance: Limited assistance Feeding assistance: Independent Dressing Assistance: Limited assistance Total Care Assistance: Limited assistance   Functional Limitations Info  Sight, Hearing, Speech Sight Info: Adequate Hearing Info: Adequate Speech Info: Adequate    SPECIAL CARE FACTORS FREQUENCY  PT (By licensed PT), OT (By licensed OT)     PT Frequency: min 5x weekly OT Frequency: min 5x weekly            Contractures Contractures Info: Not present    Additional Factors Info  Code Status, Allergies Code Status Info: full Allergies Info: Penicillins           Current Medications (03/01/2019):  This is the current hospital active medication list Current Facility-Administered Medications  Medication Dose Route Frequency Provider Last Rate Last Dose  . acetaminophen (TYLENOL) tablet 1,000 mg  1,000 mg Oral Q6H PRN Milus Banister, MD      . amiodarone (NEXTERONE PREMIX) 360-4.14 MG/200ML-% (1.8 mg/mL) IV infusion  30 mg/hr Intravenous Continuous Milus Banister, MD 16.67 mL/hr at 03/01/19 0927 30 mg/hr at 03/01/19 0927  . atorvastatin (LIPITOR) tablet 20 mg  20 mg Oral Daily Milus Banister, MD   20 mg at 03/01/19 8381  . diltiazem (CARDIZEM) 125 mg in dextrose 5% 125 mL (1 mg/mL) infusion  5-15 mg/hr Intravenous Titrated Milus Banister, MD  15 mL/hr at 03/01/19 0300 15 mg/hr at 03/01/19 0300  . escitalopram (LEXAPRO) tablet 10 mg  10 mg Oral Daily Milus Banister, MD   10 mg at 03/01/19 1749  . ferrous sulfate tablet 325 mg  325 mg Oral Daily Milus Banister, MD   325 mg at 03/01/19 4496  . folic acid (FOLVITE) tablet 1 mg  1 mg Oral Daily Milus Banister, MD   1 mg at 03/01/19 7591  . furosemide (LASIX) injection 20 mg   20 mg Intravenous BID Milus Banister, MD   20 mg at 03/01/19 6384  . loratadine (CLARITIN) tablet 10 mg  10 mg Oral Daily Milus Banister, MD   10 mg at 03/01/19 6659  . magnesium oxide (MAG-OX) tablet 400 mg  400 mg Oral BID Milus Banister, MD   400 mg at 03/01/19 9357  . metoprolol succinate (TOPROL-XL) 24 hr tablet 200 mg  200 mg Oral Daily Milus Banister, MD   200 mg at 03/01/19 0177  . miconazole nitrate (MICATIN) topical powder   Topical TID Milus Banister, MD      . multivitamin with minerals tablet 1 tablet  1 tablet Oral Daily Milus Banister, MD   1 tablet at 03/01/19 0902  . pantoprazole (PROTONIX) EC tablet 40 mg  40 mg Oral BID AC Milus Banister, MD   40 mg at 03/01/19 9390  . potassium chloride SA (KLOR-CON) CR tablet 20 mEq  20 mEq Oral Daily Kc, Ramesh, MD   20 mEq at 03/01/19 0902  . silver sulfADIAZINE (SILVADENE) 1 % cream   Topical BID Milus Banister, MD      . tamsulosin [facility]) capsule 0.4 mg  0.4 mg Oral Daily Milus Banister, MD   0.4 mg at 03/01/19 3009  . thiamine (VITAMIN B-1) tablet 250 mg  250 mg Oral Daily Milus Banister, MD   250 mg at 03/01/19 2330  . traZODone (DESYREL) tablet 50 mg  50 mg Oral QHS Milus Banister, MD   50 mg at 02/28/19 2103   Facility-Administered Medications Ordered in Other Encounters  Medication Dose Route Frequency Provider Last Rate Last Dose  . metoprolol tartrate (LOPRESSOR) injection    Anesthesia Intra-op Mariea Clonts, CRNA   5 mg at 02/27/19 1318     Discharge Medications: Please see discharge summary for a list of discharge medications.  Relevant Imaging Results:  Relevant Lab Results:   Additional Information SSN: [id number]  Alberteen Sam, LCSW

## 2019-03-02 DIAGNOSIS — R600 Localized edema: Secondary | ICD-10-CM | POA: Diagnosis not present

## 2019-03-02 DIAGNOSIS — E871 Hypo-osmolality and hyponatremia: Secondary | ICD-10-CM | POA: Diagnosis not present

## 2019-03-02 DIAGNOSIS — I4891 Unspecified atrial fibrillation: Secondary | ICD-10-CM | POA: Diagnosis not present

## 2019-03-02 LAB — COMPREHENSIVE METABOLIC PANEL
ALT: 43 U/L (ref 0–44)
AST: 66 U/L — ABNORMAL HIGH (ref 15–41)
Albumin: 2.9 g/dL — ABNORMAL LOW (ref 3.5–5.0)
Alkaline Phosphatase: 96 U/L (ref 38–126)
Anion gap: 10 (ref 5–15)
BUN: 17 mg/dL (ref 8–23)
CO2: 25 mmol/L (ref 22–32)
Calcium: 8.5 mg/dL — ABNORMAL LOW (ref 8.9–10.3)
Chloride: 96 mmol/L — ABNORMAL LOW (ref 98–111)
Creatinine, Ser: 1.22 mg/dL (ref 0.61–1.24)
GFR calc Af Amer: >60 mL/min (ref >60)
GFR calc non Af Amer: 57 mL/min — ABNORMAL LOW (ref >60)
Glucose, Bld: 104 mg/dL — ABNORMAL HIGH (ref 70–99)
Potassium: 3.4 mmol/L — ABNORMAL LOW (ref 3.5–5.1)
Sodium: 131 mmol/L — ABNORMAL LOW (ref 135–145)
Total Bilirubin: 1.8 mg/dL — ABNORMAL HIGH (ref 0.3–1.2)
Total Protein: 5.3 g/dL — ABNORMAL LOW (ref 6.5–8.1)

## 2019-03-02 LAB — CBC
HCT: 27.5 % — ABNORMAL LOW (ref 39.0–52.0)
Hemoglobin: 9.2 g/dL — ABNORMAL LOW (ref 13.0–17.0)
MCH: 32.2 pg (ref 26.0–34.0)
MCHC: 33.5 g/dL (ref 30.0–36.0)
MCV: 96.2 fL (ref 80.0–100.0)
Platelets: 172 10*3/uL (ref 150–400)
RBC: 2.86 MIL/uL — ABNORMAL LOW (ref 4.22–5.81)
RDW: 18.1 % — ABNORMAL HIGH (ref 11.5–15.5)
WBC: 6.5 10*3/uL (ref 4.0–10.5)
nRBC: 0 % (ref 0.0–0.2)

## 2019-03-02 MED ORDER — POTASSIUM CHLORIDE CRYS ER 20 MEQ PO TBCR
40.0000 meq | EXTENDED_RELEASE_TABLET | Freq: Every day | ORAL | Status: DC
  Administered 2019-03-03 – 2019-03-05 (×3): 40 meq via ORAL
  Filled 2019-03-02 (×3): qty 2

## 2019-03-02 MED ORDER — AMIODARONE HCL 200 MG PO TABS
200.0000 mg | ORAL_TABLET | Freq: Two times a day (BID) | ORAL | Status: DC
  Administered 2019-03-02 – 2019-03-05 (×7): 200 mg via ORAL
  Filled 2019-03-02 (×7): qty 1

## 2019-03-02 NOTE — Progress Notes (Signed)
PROGRESS NOTE  Haru Shaff SWF:[id number] DOB: Jun 18, 1940 DOA: 02/25/2019 PCP: [name], MD  Brief Narrative: 78 year old male with paroxysmal atrial fibrillation on Eliquis depression, GERD, history of colon polyps hypertension, hyperlipidemia, insomnia, depression, osteoarthritis, who presented to the emergency department for evaluation of low hemoglobin after a recent fall and bilateral lower extremity edema.  His hemoglobin had dropped to 6.0 from a baseline of 11 a month ago when he presented to the emergency room he was found to have a hemoglobin of 6.9 he was hypoxic with oxygen in the 80s on room air digital rectal exam showed hemoglobin of 6.9 so his Eliquis was held.  GI was consulted, he has received 2 unit of packed RBC.  He was to have had endoscopy on February 27, 2019.  But he was found to have atrial fibrillation with rapid ventricular rate of 140 so was transferred to PCU with Cardizem drip and cardiology was consulted amiodarone was added he is no longer on the amiodarone drips.  He had his EGD on February 28, 2019   Interval history/Subjective: Patient seen and examined at bedside he stated that he feels very well he denies any emesis or blood in stool.  He stated that he had a normal colonoscopy last year  Assessment/Plan #1 acute blood loss anemia with hemoglobin positive stool.  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  We will continue PPI 40 mg daily he is status post 2 unit packed RBC hemoglobin is stable now at 9.2  2.  Paroxysmal atrial fibrillation with rapid ventricular rate he has completed his amiodarone and Cardizem drip.  He is currently on amiodarone and metoprolol p.o.  Continue metoprolol [place] vas score is 3.  He was resumed on Eliquis to yesterday  3.  Fall at home PT is following patient they advised SNF  4.  Bilateral lower extremity edema with shortness of breath most likely  from congestive heart failure he is currently on IV Lasix  6.  5 chronic kidney disease stage III creatinine is 1.2  7.  Slight hyponatremia continue Lasix  8.  Slight hypokalemia it is 3.4 today it was worse than this so there is improvement  9.  Mild transaminitis it is improving ALT is back to normal AST still slightly elevated    DVT prophylaxis: Eliquis and SCD Code Status: Full Family Communication: None at bedside discussed with patient Disposition Plan: Patient will remain inpatient his RVR is improved PT advised skilled nursing facility social worker is working on placement  Dr. Kyung Bacca [facility] Pager [phone]  03/02/2019, 8:04 AM  LOS: 4 days   Consultants:  Cardiology  Gastroenterology  Procedures:  EGD February 28, 2019    Antimicrobials:  None   Objective: Vitals:  Vitals:   03/02/19 0740 03/02/19 0802  BP: 100/86 119/75  Pulse: (!) 122 (!) 104  Resp: (!) 24 16  Temp: 97.6 F (36.4 C)   SpO2: 96%     Exam:  Constitutional:  . Appears calm and comfortable alert oriented x3 Eyes:  . pupils and irises appear normal . Normal lids and conjunctivae ENMT:  . grossly normal hearing  . Lips appear normal . external ears, nose appear normal . Oropharynx: mucosa, tongue,posterior pharynx appear normal Neck:  . neck appears normal, no masses, normal ROM, supple . no thyromegaly Respiratory:  . CTA bilaterally, no w/r/r.  . Respiratory effort normal. No  retractions or accessory muscle use Cardiovascular:  . RRR, no m/r/g . No LE extremity edema   . Normal pedal pulses Abdomen:  . Abdomen appears normal; no tenderness or masses . No hernias . No HSM Musculoskeletal:  . Digits/nails BUE: no clubbing, cyanosis, petechiae, infection . exam of joints, bones, muscles of at least one of following: head/neck, RUE, LUE, RLE, LLE   o strength and tone normal, no atrophy, no abnormal movements o No tenderness, masses o Normal ROM, no  contractures  . gait and station Skin:  . No rashes, lesions, ulcers . palpation of skin: no induration or nodules Neurologic:  . CN 2-12 intact . Sensation all 4 extremities intact Psychiatric:  . Mental status o Mood, affect appropriate pleasant o Orientation to person, place, time  . judgment and insight appear intact     I have personally reviewed the following:   Data: 03/02/19 0411  Comprehensive metabolic panel  Collected: 03/02/19 0228  Final result  Specimen: Blood   Sodium 131Low  mmol/L Albumin 2.9Low  g/dL  Potassium 3.4Low  mmol/L AST 66High  U/L  Chloride 96Low  mmol/L ALT 43 U/L  CO2 25 mmol/L Alkaline Phosphatase 96 U/L  Glucose 104High  mg/dL Total Bilirubin 1.8High  mg/dL  BUN 17 mg/dL GFR, Est Non African American 57Low  mL/min  Creatinine 1.22 mg/dL GFR, Est African American >60 mL/min  Calcium 8.5Low  mg/dL Anion gap 10   Total Protein 5.3Low  g/dL         03/02/19 0339  CBC  Collected: 03/02/19 0228  Final result  Specimen: Blood   WBC 6.5 K/uL MCH 32.2 pg  RBC 2.86Low  MIL/uL MCHC 33.5 g/dL  Hemoglobin 9.2Low  g/dL RDW 18.1High  %  HCT 27.5Low  % Platelets 172 K/uL  MCV 96.2 fL nRBC 0.0 %      .   Scheduled Meds: . apixaban  5 mg Oral BID  . atorvastatin  20 mg Oral Daily  . diltiazem  360 mg Oral Daily  . escitalopram  10 mg Oral Daily  . ferrous sulfate  325 mg Oral Daily  . folic acid  1 mg Oral Daily  . furosemide  20 mg Intravenous BID  . loratadine  10 mg Oral Daily  . magnesium oxide  400 mg Oral BID  . metoprolol  200 mg Oral Daily  . miconazole nitrate   Topical TID  . multivitamin with minerals  1 tablet Oral Daily  . pantoprazole  40 mg Oral BID AC  . potassium chloride  20 mEq Oral Daily  . silver sulfADIAZINE   Topical BID  . tamsulosin  0.4 mg Oral Daily  . vitamin B-1  250 mg Oral Daily  . traZODone  50 mg Oral QHS   Continuous Infusions: . amiodarone 30 mg/hr (03/02/19 0717)    Principal Problem:   GI bleed  Active Problems:   Symptomatic anemia   Falls   Bilateral leg edema   Hyponatremia   Atrial fibrillation with rapid ventricular response (HCC)   LOS: 4 days

## 2019-03-02 NOTE — Progress Notes (Signed)
Progress Note  Patient Name: Darryl Diaz Date of Encounter: 03/02/2019  Primary Cardiologist: Sherren Mocha, MD   Subjective   No issues overnight. Feels well. Diuresing, less leg edema. Maintaining in rate-controlled afib.  Inpatient Medications    Scheduled Meds: . apixaban  5 mg Oral BID  . atorvastatin  20 mg Oral Daily  . diltiazem  360 mg Oral Daily  . escitalopram  10 mg Oral Daily  . ferrous sulfate  325 mg Oral Daily  . folic acid  1 mg Oral Daily  . furosemide  20 mg Intravenous BID  . loratadine  10 mg Oral Daily  . magnesium oxide  400 mg Oral BID  . metoprolol  200 mg Oral Daily  . miconazole nitrate   Topical TID  . multivitamin with minerals  1 tablet Oral Daily  . pantoprazole  40 mg Oral BID AC  . potassium chloride  20 mEq Oral Daily  . silver sulfADIAZINE   Topical BID  . tamsulosin  0.4 mg Oral Daily  . vitamin B-1  250 mg Oral Daily  . traZODone  50 mg Oral QHS   Continuous Infusions: . amiodarone 30 mg/hr (03/02/19 0800)   PRN Meds: acetaminophen   Vital Signs    Vitals:   03/02/19 0400 03/02/19 0740 03/02/19 0802 03/02/19 1035  BP:  100/86 119/75 114/63  Pulse: 93 (!) 122 (!) 104 (!) 101  Resp: 19 (!) 24 16 16   Temp:  97.6 F (36.4 C)  98.1 F (36.7 C)  TempSrc:  Oral  Oral  SpO2:  96%  98%  Weight:      Height:        Intake/Output Summary (Last 24 hours) at 03/02/2019 1243 Last data filed at 03/02/2019 1010 Gross per 24 hour  Intake 719.11 ml  Output 1900 ml  Net -1180.89 ml   Last 3 Weights 03/02/2019 03/01/2019 02/28/2019  Weight (lbs) 248 lb 14.4 oz 254 lb 6.6 oz 242 lb 8.1 oz  Weight (kg) 112.9 kg 115.4 kg 110 kg      Telemetry    Atrial fibrillation in the 80-100's - Personally Reviewed  ECG    n/a - Personally Reviewed  Physical Exam   VS:  BP 114/63 (BP Location: Left Arm)   Pulse (!) 101   Temp 98.1 F (36.7 C) (Oral)   Resp 16   Ht 6' (1.829 m)   Wt 112.9 kg   SpO2 98%   BMI 33.76 kg/m  ,  BMI Body mass index is 33.76 kg/m.  General appearance: alert and no distress Lungs: clear to auscultation bilaterally Heart: irregularly irregular rhythm Extremities: edema 1+ bilateral pitting edema, mild erythema over right shin Neurologic: Grossly normal  Labs    High Sensitivity Troponin:  No results for input(s): TROPONINIHS in the last 720 hours.    Chemistry Recent Labs  Lab 02/28/19 0314 03/01/19 0226 03/02/19 0228  NA 127* 132* 131*  K 3.2* 3.6 3.4*  CL 88* 95* 96*  CO2 24 23 25   GLUCOSE 396* 133* 104*  BUN 10 16 17   CREATININE 1.24 1.25* 1.22  CALCIUM 7.6* 8.5* 8.5*  PROT 5.1* 5.3* 5.3*  ALBUMIN 2.7* 2.8* 2.9*  AST 81* 70* 66*  ALT 49* 45* 43  ALKPHOS 98 98 96  BILITOT 2.0* 1.7* 1.8*  GFRNONAA 56* 55* 57*  GFRAA >60 >60 >60  ANIONGAP 15 14 10      Hematology Recent Labs  Lab 02/28/19 7341 03/01/19 0226 03/02/19 9379  WBC 6.5 6.8 6.5  RBC 2.68* 2.77* 2.86*  HGB 8.6* 8.9* 9.2*  HCT 26.1* 27.1* 27.5*  MCV 97.4 97.8 96.2  MCH 32.1 32.1 32.2  MCHC 33.0 32.8 33.5  RDW 18.5* 18.6* 18.1*  PLT 133* 143* 172    BNP Recent Labs  Lab 02/25/19 1735  BNP 209.0*     DDimer No results for input(s): DDIMER in the last 168 hours.   Radiology    No results found.  Cardiac Studies   Echo 02/26/19: IMPRESSIONS  1. Left ventricular ejection fraction, by visual estimation, is 60 to 65%. The left ventricle has normal function. Normal left ventricular size. There is no left ventricular hypertrophy.  2. Global right ventricle has normal systolic function.The right ventricular size is normal. No increase in right ventricular wall thickness.  3. Left atrial size was mildly dilated.  4. Right atrial size was normal.  5. Mild mitral annular calcification.  6. Mild thickening of the anterior and posterior mitral valve leaflet(s).  7. Mild to moderate aortic valve annular calcification.  8. The mitral valve is normal in structure. No evidence of mitral valve  regurgitation. No evidence of mitral stenosis.  9. The tricuspid valve is normal in structure. Tricuspid valve regurgitation is trivial. 10. The aortic valve is tricuspid Aortic valve regurgitation was not visualized by color flow Doppler. Mild to moderate aortic valve sclerosis/calcification without any evidence of aortic stenosis. 11. The pulmonic valve was normal in structure. Pulmonic valve regurgitation is trivial by color flow Doppler. 12. Aortic dilatation noted. 13. There is mild dilatation of the aortic root measuring 38 mm. 14. Moderately elevated pulmonary artery systolic pressure. 15. The inferior vena cava is normal in size with greater than 50% respiratory variability, suggesting right atrial pressure of 3 mmHg.  Patient Profile     Mr. Bunton is a 88M with PAF, hyeprtension, hyperlipidemia, GERD, prior EtOH abuse admitted with GI bleed and falls.   Assessment & Plan    # Atrial fibrillation with RVR: - Continue diltiazem 360 mg daily - Switch amiodarone from IV to oral 400 mg daily today - Continue toprol XL 200 mg daily - On Eliquis 5 mg BID  # GI bleed: Went for EGD on 10/22 and had a Mallory Weiss tear and erosions/erythema in stomach.  Concern for EtOH related gastritis.  He was deemed stable to resume anticoagulation and PPI was increased.   # Hypertension: Blood pressure has been labile.  BP control is focused on rate management with metoprolol and diltiazem.   # Acute on chronic diastolic heart failure: Breathing is improving.  Renal function stable.  Sodium up and down. Continue IV diuretic today and possibly switch to oral diuretic tomorrow  #Hypokalemia: Increase supplement from 20 to 40 MEQ daily    For questions or updates, please contact [place] Please consult [email or web address] for contact info under   Pixie Casino, MD, FACC, [place] Director of the [facility] Lipid Disorders &  Cardiovascular Risk Reduction  Clinic Diplomate of the American Board of Clinical Lipidology Attending Cardiologist  Direct Dial: [phone]  Fax: [phone]  Website:  [email or web address]  Pixie Casino, MD  03/02/2019, 12:43 PM

## 2019-03-03 DIAGNOSIS — I4891 Unspecified atrial fibrillation: Secondary | ICD-10-CM | POA: Diagnosis not present

## 2019-03-03 MED ORDER — FUROSEMIDE 40 MG PO TABS
40.0000 mg | ORAL_TABLET | Freq: Every day | ORAL | Status: DC
  Administered 2019-03-04 – 2019-03-05 (×2): 40 mg via ORAL
  Filled 2019-03-03 (×2): qty 1

## 2019-03-03 NOTE — Progress Notes (Signed)
PROGRESS NOTE  Darryl Diaz QIO:[id number] DOB: 05-Oct-1940 DOA: 02/25/2019 PCP: [name], MD  Brief Narrative: 78 year old male with paroxysmal atrial fibrillation on Eliquis depression, GERD, history of colon polyps hypertension, hyperlipidemia, insomnia, depression, osteoarthritis, who presented to the emergency department for evaluation of low hemoglobin after a recent fall and bilateral lower extremity edema.  His hemoglobin had dropped to 6.0 from a baseline of 11 a month ago when he presented to the emergency room he was found to have a hemoglobin of 6.9 he was hypoxic with oxygen in the 80s on room air digital rectal exam showed hemoglobin of 6.9 so his Eliquis was held.  GI was consulted, he has received 2 unit of packed RBC.  He was to have had endoscopy on February 27, 2019.  But he was found to have atrial fibrillation with rapid ventricular rate of 140 so was transferred to PCU with Cardizem drip and cardiology was consulted amiodarone was added he is no longer on the amiodarone drips.  He had his EGD on February 28, 2019   Interval history/Subjective: Patient seen and examined at bedside he stated that he feels very well he denies any emesis or blood in stool.  He stated that he had a normal colonoscopy last year   March 03, 2019 update: Patient seen and examined at bedside denies any new complaints  Assessment/Plan #1 acute blood loss anemia with hemoglobin positive stool.  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  We will continue PPI 40 mg daily he is status post 2 unit packed RBC hemoglobin is stable now at 9.2  2.  Paroxysmal atrial fibrillation with rapid ventricular rate he has completed his amiodarone and Cardizem drip.  He is currently on amiodarone and metoprolol p.o.  Continue metoprolol [place] vas score is 3.  He was resumed on Eliquis to yesterday  3.  Fall at home PT is following patient  they advised SNF  4.  Bilateral lower extremity edema with shortness of breath most likely from congestive heart failure he is currently on IV Lasix  6.  5 chronic kidney disease stage III creatinine is 1.2  7.  Slight hyponatremia continue Lasix  8.  Slight hypokalemia it is 3.4 today it was worse than this so there is improvement  9.  Mild transaminitis it is improving ALT is back to normal AST still slightly elevated    DVT prophylaxis: Eliquis and SCD Code Status: Full Family Communication: None at bedside discussed with patient Disposition Plan: Patient will remain inpatient his RVR is improved PT advised skilled nursing facility social worker is working on placement  Dr. Kyung Bacca [facility] Pager [phone]  03/03/2019, 8:59 AM  LOS: 5 days   Consultants:  Cardiology  Gastroenterology  Procedures:  EGD February 28, 2019    Antimicrobials:  None   Objective: Vitals:  Vitals:   03/03/19 0748 03/03/19 0800  BP: (!) 93/51 (!) 98/59  Pulse: 98 (!) 111  Resp: 20 (!) 24  Temp: 97.8 F (36.6 C)   SpO2: 100%     Exam:  Constitutional:  . Appears calm and comfortable alert oriented x3 Eyes:  . pupils and irises appear normal . Normal lids and conjunctivae ENMT:  . grossly normal hearing  . Lips appear normal . external ears, nose appear normal . Oropharynx: mucosa, tongue,posterior pharynx appear normal Neck:  . neck appears normal, no masses, normal ROM,  supple . no thyromegaly Respiratory:  . CTA bilaterally, no w/r/r.  . Respiratory effort normal. No retractions or accessory muscle use Cardiovascular:  . RRR, no m/r/g . No LE extremity edema   . Normal pedal pulses Abdomen:  . Abdomen appears normal; no tenderness or masses . No hernias . No HSM Musculoskeletal:  . Digits/nails BUE: no clubbing, cyanosis, petechiae, infection . exam of joints, bones, muscles of at least one of following: head/neck, RUE, LUE, RLE, LLE   o strength and  tone normal, no atrophy, no abnormal movements o No tenderness, masses o Normal ROM, no contractures  . gait and station Skin:  . No rashes, lesions, ulcers . palpation of skin: no induration or nodules Neurologic:  . CN 2-12 intact . Sensation all 4 extremities intact Psychiatric:  . Mental status o Mood, affect appropriate pleasant o Orientation to person, place, time  . judgment and insight appear intact     I have personally reviewed the following:   Data: 03/02/19 0411  Comprehensive metabolic panel  Collected: 03/02/19 0228  Final result  Specimen: Blood   Sodium 131Low  mmol/L Albumin 2.9Low  g/dL  Potassium 3.4Low  mmol/L AST 66High  U/L  Chloride 96Low  mmol/L ALT 43 U/L  CO2 25 mmol/L Alkaline Phosphatase 96 U/L  Glucose 104High  mg/dL Total Bilirubin 1.8High  mg/dL  BUN 17 mg/dL GFR, Est Non African American 57Low  mL/min  Creatinine 1.22 mg/dL GFR, Est African American >60 mL/min  Calcium 8.5Low  mg/dL Anion gap 10   Total Protein 5.3Low  g/dL         03/02/19 0339  CBC  Collected: 03/02/19 0228  Final result  Specimen: Blood   WBC 6.5 K/uL MCH 32.2 pg  RBC 2.86Low  MIL/uL MCHC 33.5 g/dL  Hemoglobin 9.2Low  g/dL RDW 18.1High  %  HCT 27.5Low  % Platelets 172 K/uL  MCV 96.2 fL nRBC 0.0 %      .   Scheduled Meds: . amiodarone  200 mg Oral BID  . apixaban  5 mg Oral BID  . atorvastatin  20 mg Oral Daily  . diltiazem  360 mg Oral Daily  . escitalopram  10 mg Oral Daily  . ferrous sulfate  325 mg Oral Daily  . folic acid  1 mg Oral Daily  . furosemide  20 mg Intravenous BID  . loratadine  10 mg Oral Daily  . magnesium oxide  400 mg Oral BID  . metoprolol  200 mg Oral Daily  . miconazole nitrate   Topical TID  . multivitamin with minerals  1 tablet Oral Daily  . pantoprazole  40 mg Oral BID AC  . potassium chloride  40 mEq Oral Daily  . silver sulfADIAZINE   Topical BID  . tamsulosin  0.4 mg Oral Daily  . vitamin B-1  250 mg Oral Daily  .  traZODone  50 mg Oral QHS   Continuous Infusions:   Principal Problem:   GI bleed Active Problems:   Symptomatic anemia   Falls   Bilateral leg edema   Hyponatremia   Atrial fibrillation with rapid ventricular response (HCC)   LOS: 5 days

## 2019-03-03 NOTE — Progress Notes (Signed)
Progress Note  Patient Name: Darryl Diaz Date of Encounter: 03/03/2019  Primary Cardiologist: Sherren Mocha, MD   Subjective   Hypotensive this morning when going for a walk - not symptomatic with it, now recovered. About 500 cc negative overnight - 2.5L negative.  Creatinine stable at 1.22. Remains in rate-controlled afib.  Inpatient Medications    Scheduled Meds: . amiodarone  200 mg Oral BID  . apixaban  5 mg Oral BID  . atorvastatin  20 mg Oral Daily  . diltiazem  360 mg Oral Daily  . escitalopram  10 mg Oral Daily  . ferrous sulfate  325 mg Oral Daily  . folic acid  1 mg Oral Daily  . furosemide  20 mg Intravenous BID  . loratadine  10 mg Oral Daily  . magnesium oxide  400 mg Oral BID  . metoprolol  200 mg Oral Daily  . miconazole nitrate   Topical TID  . multivitamin with minerals  1 tablet Oral Daily  . pantoprazole  40 mg Oral BID AC  . potassium chloride  40 mEq Oral Daily  . silver sulfADIAZINE   Topical BID  . tamsulosin  0.4 mg Oral Daily  . vitamin B-1  250 mg Oral Daily  . traZODone  50 mg Oral QHS   Continuous Infusions:  PRN Meds: acetaminophen   Vital Signs    Vitals:   03/03/19 1029 03/03/19 1033 03/03/19 1054 03/03/19 1100  BP: 111/65  112/62   Pulse: (!) 114 97 100 (!) 109  Resp: (!) 30 14 16  (!) 21  Temp:      TempSrc:      SpO2:   98%   Weight:      Height:        Intake/Output Summary (Last 24 hours) at 03/03/2019 1423 Last data filed at 03/03/2019 1349 Gross per 24 hour  Intake 820 ml  Output 1175 ml  Net -355 ml   Last 3 Weights 03/03/2019 03/02/2019 03/01/2019  Weight (lbs) 244 lb 14.9 oz 248 lb 14.4 oz 254 lb 6.6 oz  Weight (kg) 111.1 kg 112.9 kg 115.4 kg      Telemetry    Atrial fibrillation in the 80-100's - Personally Reviewed  ECG    n/a - Personally Reviewed  Physical Exam   VS:  BP 112/62 (BP Location: Right Arm)   Pulse (!) 109   Temp 97.8 F (36.6 C) (Oral)   Resp (!) 21   Ht 6' (1.829 m)   Wt  111.1 kg   SpO2 98%   BMI 33.22 kg/m  , BMI Body mass index is 33.22 kg/m.  General appearance: alert and no distress Lungs: clear to auscultation bilaterally Heart: irregularly irregular rhythm Extremities: edema trace pitting edema, mild erythema over right shin Neurologic: Grossly normal  Labs    High Sensitivity Troponin:  No results for input(s): TROPONINIHS in the last 720 hours.    Chemistry Recent Labs  Lab 02/28/19 0314 03/01/19 0226 03/02/19 0228  NA 127* 132* 131*  K 3.2* 3.6 3.4*  CL 88* 95* 96*  CO2 24 23 25   GLUCOSE 396* 133* 104*  BUN 10 16 17   CREATININE 1.24 1.25* 1.22  CALCIUM 7.6* 8.5* 8.5*  PROT 5.1* 5.3* 5.3*  ALBUMIN 2.7* 2.8* 2.9*  AST 81* 70* 66*  ALT 49* 45* 43  ALKPHOS 98 98 96  BILITOT 2.0* 1.7* 1.8*  GFRNONAA 56* 55* 57*  GFRAA >60 >60 >60  ANIONGAP 15 14 10  Hematology Recent Labs  Lab 02/28/19 0314 03/01/19 0226 03/02/19 0228  WBC 6.5 6.8 6.5  RBC 2.68* 2.77* 2.86*  HGB 8.6* 8.9* 9.2*  HCT 26.1* 27.1* 27.5*  MCV 97.4 97.8 96.2  MCH 32.1 32.1 32.2  MCHC 33.0 32.8 33.5  RDW 18.5* 18.6* 18.1*  PLT 133* 143* 172    BNP Recent Labs  Lab 02/25/19 1735  BNP 209.0*     DDimer No results for input(s): DDIMER in the last 168 hours.   Radiology    No results found.  Cardiac Studies   Echo 02/26/19: IMPRESSIONS  1. Left ventricular ejection fraction, by visual estimation, is 60 to 65%. The left ventricle has normal function. Normal left ventricular size. There is no left ventricular hypertrophy.  2. Global right ventricle has normal systolic function.The right ventricular size is normal. No increase in right ventricular wall thickness.  3. Left atrial size was mildly dilated.  4. Right atrial size was normal.  5. Mild mitral annular calcification.  6. Mild thickening of the anterior and posterior mitral valve leaflet(s).  7. Mild to moderate aortic valve annular calcification.  8. The mitral valve is normal in  structure. No evidence of mitral valve regurgitation. No evidence of mitral stenosis.  9. The tricuspid valve is normal in structure. Tricuspid valve regurgitation is trivial. 10. The aortic valve is tricuspid Aortic valve regurgitation was not visualized by color flow Doppler. Mild to moderate aortic valve sclerosis/calcification without any evidence of aortic stenosis. 11. The pulmonic valve was normal in structure. Pulmonic valve regurgitation is trivial by color flow Doppler. 12. Aortic dilatation noted. 13. There is mild dilatation of the aortic root measuring 38 mm. 14. Moderately elevated pulmonary artery systolic pressure. 15. The inferior vena cava is normal in size with greater than 50% respiratory variability, suggesting right atrial pressure of 3 mmHg.  Patient Profile     Mr. Katt is a 54M with PAF, hyeprtension, hyperlipidemia, GERD, prior EtOH abuse admitted with GI bleed and falls.   Assessment & Plan    # Atrial fibrillation with RVR: - Continue diltiazem 360 mg daily, oral amiodarone 400 mg daily - Continue toprol XL 200 mg daily - On Eliquis 5 mg BID  # GI bleed: Went for EGD on 10/22 and had a Mallory Weiss tear and erosions/erythema in stomach.  Concern for EtOH related gastritis.  He was deemed stable to resume anticoagulation and PPI was increased.   # Hypertension: Blood pressure has been labile - noted to be hypotensive this morning..  BP control is focused on rate management with metoprolol and diltiazem. May need to decrease BP meds.   # Acute on chronic diastolic heart failure: Breathing is improving.  Renal function stable.  Sodium up and down. Switch to oral lasix 40 mg daily starting tomorrow.  #Hypokalemia: On 50 MEQ daily - no labs from today.    For questions or updates, please contact [place] Please consult [email or web address] for contact info under   Pixie Casino, MD, FACC, [place] Director of the  Advanced Lipid Disorders &  Cardiovascular Risk Reduction Clinic Diplomate of the American Board of Clinical Lipidology Attending Cardiologist  Direct Dial: [phone]  Fax: [phone]  Website:  [email or web address]  Pixie Casino, MD  03/03/2019, 2:23 PM

## 2019-03-04 DIAGNOSIS — L8996 Pressure-induced deep tissue damage of unspecified site: Secondary | ICD-10-CM

## 2019-03-04 DIAGNOSIS — E876 Hypokalemia: Secondary | ICD-10-CM

## 2019-03-04 DIAGNOSIS — W19XXXD Unspecified fall, subsequent encounter: Secondary | ICD-10-CM

## 2019-03-04 DIAGNOSIS — D62 Acute posthemorrhagic anemia: Secondary | ICD-10-CM

## 2019-03-04 DIAGNOSIS — I5031 Acute diastolic (congestive) heart failure: Secondary | ICD-10-CM

## 2019-03-04 LAB — SARS CORONAVIRUS 2 (TAT 6-24 HRS): SARS Coronavirus 2: NEGATIVE

## 2019-03-04 LAB — BASIC METABOLIC PANEL
Anion gap: 10 (ref 5–15)
BUN: 16 mg/dL (ref 8–23)
CO2: 23 mmol/L (ref 22–32)
Calcium: 8.5 mg/dL — ABNORMAL LOW (ref 8.9–10.3)
Chloride: 98 mmol/L (ref 98–111)
Creatinine, Ser: 1.17 mg/dL (ref 0.61–1.24)
GFR calc Af Amer: >60 mL/min (ref >60)
GFR calc non Af Amer: 60 mL/min — ABNORMAL LOW (ref >60)
Glucose, Bld: 100 mg/dL — ABNORMAL HIGH (ref 70–99)
Potassium: 3.8 mmol/L (ref 3.5–5.1)
Sodium: 131 mmol/L — ABNORMAL LOW (ref 135–145)

## 2019-03-04 NOTE — Progress Notes (Signed)
PROGRESS NOTE  Darryl Diaz DUK:[id number] DOB: 1941/02/08   PCP: [name], MD  Patient is from: Home  DOA: 02/25/2019 LOS: 6  Brief Narrative / Interim history: 34yom w PAF on eliquis, HTN, HLD, insomnia,,depression,GERD, osteoarthritis presenting with low Hgb to 6.0, recent falls, DOE and bilateral LE.  Patient had blood work done at PCP and was told that Hgb 6.0 and was advised to go to ED.  Baseline Hgb 11.  About 8 months ago.  Reportedly had 3 falls in the last week.  In ER, hypoxic to the 80s on RA.  Hgb 6.9.  FOBT positive.  Eliquis held.  Transfused 2 units.  GI consulted.    Patient had EGD on 10/22 that revealed moderate nonspecific pangastritis and LA Grade A GE junction esophagitis.  Biopsy taken for H. pylori testing.  Hospital course complicated by A. fib with RVR.  Started on Cardizem drip.on amiodarone drip.  Transitioned to oral amiodarone, Cardizem CD and metoprolol.  Eliquis resumed 10/23.  H&H remained stable.  Evaluated by PT/OT who recommended SNF.  Awaiting on Covid result.  Subjective: No major events overnight of this morning.  No complaint this morning.  He denies chest pain, dyspnea, palpitation or dizziness.  Denies GI or GU symptoms.  Objective: Vitals:   03/04/19 0334 03/04/19 0500 03/04/19 0827 03/04/19 1042  BP: 107/76  101/61 (!) 105/57  Pulse: 89  88 94  Resp: 16  20 20   Temp: 97.9 F (36.6 C)  98.5 F (36.9 C) 98.2 F (36.8 C)  TempSrc: Oral  Oral Oral  SpO2: 97%  100% 99%  Weight:  111.7 kg    Height:        Intake/Output Summary (Last 24 hours) at 03/04/2019 1420 Last data filed at 03/04/2019 0500 Gross per 24 hour  Intake 360 ml  Output 850 ml  Net -490 ml   Filed Weights   03/02/19 0259 03/03/19 0305 03/04/19 0500  Weight: 112.9 kg 111.1 kg 111.7 kg    Examination:  GENERAL: No acute distress.  Appears well.  HEENT: MMM.  Vision and hearing grossly intact.  NECK: Supple.  No apparent JVD.  RESP:  No IWOB. Good  air movement bilaterally. CVS: Irregular rhythm.  Normal rate.  Heart sounds normal.  ABD/GI/GU: Bowel sounds present. Soft. Non tender.  MSK/EXT:  Moves extremities. No apparent deformity.  Trace edema bilaterally. SKIN: no apparent skin lesion or wound NEURO: Awake, alert and oriented appropriately.  No gross deficit.  PSYCH: Calm. Normal affect.   Assessment & Plan: Acute blood loss symptomatic anemia due to GI bleed: H&H stable. -EGD on 10/22 revealed moderate nonspecific pan gastritis and esophagitis. -Eliquis resumed on 10/23 with GI permission. -Closely monitor H&H-stable. -Continue PPI.  Paroxysmal A. fib with RVR: RVR resolved. -Continue amiodarone, Cardizem CD and metoprolol per cardiology. -To be discharged on amiodarone 200 mg daily per cardiology -Outpatient follow-up with cardiology, Dr. Burt Knack.  Acute on chronic diastolic CHF: likely due to anemia.  Echo with EF of 60 to 65%.  Has has cardinal symptoms on presentation. Briefly diuresed with IV Lasix.  -2.7 L this admission. -Continue oral Lasix 40 mg daily per cardiology -Monitor fluid status, renal function and lites.  CKD-3: Stable -Continue monitoring  Hyponatremia: Stable. -Continue monitoring  Hypokalemia/hypomagnesemia: Resolved. -Recheck in the morning  Mild transaminitis: Likely due to anemia.  Improved. -Check CK -Continue trending  Mild thrombocytopenia: Resolved.  Hyperglycemia without diagnosis of diabetes: A1c 4.7%.  Hyperglycemia resolved.  Falls at  home: Felt off balance and fell 3 times last week likely due to anemia. -PT/OT-SNF.  Pressure Injury 03/30/16 Deep Tissue Injury - Purple or maroon localized area of discolored intact skin or blood-filled blister due to damage of underlying soft tissue from pressure and/or shear. (Active)  03/30/16   Location: Ankle  Location Orientation: Left  Staging: Deep Tissue Injury - Purple or maroon localized area of discolored intact skin or  blood-filled blister due to damage of underlying soft tissue from pressure and/or shear.  Wound Description (Comments):   Present on Admission: Yes    Malnutrition Type/Characteristics and interventions:            DVT prophylaxis: On Eliquis for atrial fibrillation Code Status: Full code Family Communication: Patient and/or RN. Available if any question.  Disposition Plan: SNF after Covid result Consultants: Cardiology, GI  Procedures:  10/22-EGD  Microbiology summarized: 10/19-COVID-19 negative. 10/26-COVID-19 pending.  Sch Meds:  Scheduled Meds: . amiodarone  200 mg Oral BID  . apixaban  5 mg Oral BID  . atorvastatin  20 mg Oral Daily  . diltiazem  360 mg Oral Daily  . escitalopram  10 mg Oral Daily  . ferrous sulfate  325 mg Oral Daily  . folic acid  1 mg Oral Daily  . furosemide  40 mg Oral Daily  . loratadine  10 mg Oral Daily  . magnesium oxide  400 mg Oral BID  . metoprolol  200 mg Oral Daily  . miconazole nitrate   Topical TID  . multivitamin with minerals  1 tablet Oral Daily  . pantoprazole  40 mg Oral BID AC  . potassium chloride  40 mEq Oral Daily  . silver sulfADIAZINE   Topical BID  . tamsulosin  0.4 mg Oral Daily  . vitamin B-1  250 mg Oral Daily  . traZODone  50 mg Oral QHS   Continuous Infusions: PRN Meds:.acetaminophen  Antimicrobials: Anti-infectives (From admission, onward)   None       I have personally reviewed the following labs and images: CBC: Recent Labs  Lab 02/25/19 1729  02/26/19 1616 02/27/19 0443 02/28/19 0314 03/01/19 0226 03/02/19 0228  WBC 8.0   < > 5.9 6.6 6.5 6.8 6.5  NEUTROABS 5.9  --   --   --   --   --   --   HGB 6.9*   < > 8.4* 8.6* 8.6* 8.9* 9.2*  HCT 19.7*   < > 24.0* 25.1* 26.1* 27.1* 27.5*  MCV 97.5   < > 92.7 95.1 97.4 97.8 96.2  PLT 117*   < > 112* 116* 133* 143* 172   < > = values in this interval not displayed.   BMP &GFR Recent Labs  Lab 02/25/19 1758  02/27/19 0443 02/27/19 1618  02/28/19 0314 03/01/19 0226 03/02/19 0228 03/04/19 0222  NA  --    < > 133*  --  127* 132* 131* 131*  K  --    < > 3.5  --  3.2* 3.6 3.4* 3.8  CL  --    < > 96*  --  88* 95* 96* 98  CO2  --    < > 25  --  24 23 25 23   GLUCOSE  --    < > 114*  --  396* 133* 104* 100*  BUN  --    < > 8  --  10 16 17 16   CREATININE  --    < > 1.31*  --  1.24 1.25* 1.22 1.17  CALCIUM  --    < > 8.4*  --  7.6* 8.5* 8.5* 8.5*  MG 1.2*  --   --  1.2*  --   --   --   --    < > = values in this interval not displayed.   Estimated Creatinine Clearance: 68.2 mL/min (by C-G formula based on SCr of 1.17 mg/dL). Liver & Pancreas: Recent Labs  Lab 02/25/19 1729 02/26/19 0557 02/28/19 0314 03/01/19 0226 03/02/19 0228  AST 112* 93* 81* 70* 66*  ALT 67* 56* 49* 45* 43  ALKPHOS 120 107 98 98 96  BILITOT 2.1* 2.6* 2.0* 1.7* 1.8*  PROT 5.5* 5.3* 5.1* 5.3* 5.3*  ALBUMIN 3.4* 3.0* 2.7* 2.8* 2.9*   No results for input(s): LIPASE, AMYLASE in the last 168 hours. No results for input(s): AMMONIA in the last 168 hours. Diabetic: No results for input(s): HGBA1C in the last 72 hours. No results for input(s): GLUCAP in the last 168 hours. Cardiac Enzymes: No results for input(s): CKTOTAL, CKMB, CKMBINDEX, TROPONINI in the last 168 hours. No results for input(s): PROBNP in the last 8760 hours. Coagulation Profile: No results for input(s): INR, PROTIME in the last 168 hours. Thyroid Function Tests: No results for input(s): TSH, T4TOTAL, FREET4, T3FREE, THYROIDAB in the last 72 hours. Lipid Profile: No results for input(s): CHOL, HDL, LDLCALC, TRIG, CHOLHDL, LDLDIRECT in the last 72 hours. Anemia Panel: No results for input(s): VITAMINB12, FOLATE, FERRITIN, TIBC, IRON, RETICCTPCT in the last 72 hours. Urine analysis:    Component Value Date/Time   COLORURINE YELLOW 08/11/2018 1119   APPEARANCEUR CLEAR 08/11/2018 1119   LABSPEC 1.005 08/11/2018 1119   PHURINE 6.0 08/11/2018 1119   GLUCOSEU NEGATIVE 08/11/2018  1119   HGBUR SMALL (A) 08/11/2018 1119   BILIRUBINUR NEGATIVE 08/11/2018 [place] 08/11/2018 1119   PROTEINUR NEGATIVE 08/11/2018 1119   NITRITE NEGATIVE 08/11/2018 1119   LEUKOCYTESUR NEGATIVE 08/11/2018 1119   Sepsis Labs: Invalid input(s): PROCALCITONIN, [place]  Microbiology: Recent Results (from the past 240 hour(s))  SARS CORONAVIRUS 2 (TAT 6-24 HRS) Nasopharyngeal Nasopharyngeal Swab     Status: None   Collection Time: 02/25/19  7:18 PM   Specimen: Nasopharyngeal Swab  Result Value Ref Range Status   SARS Coronavirus 2 NEGATIVE NEGATIVE Final    Comment: (NOTE) SARS-CoV-2 target nucleic acids are NOT DETECTED. The SARS-CoV-2 RNA is generally detectable in upper and lower respiratory specimens during the acute phase of infection. Negative results do not preclude SARS-CoV-2 infection, do not rule out co-infections with other pathogens, and should not be used as the sole basis for treatment or other patient management decisions. Negative results must be combined with clinical observations, patient history, and epidemiological information. The expected result is Negative. Fact Sheet for Patients: SugarRoll.be Fact Sheet for Healthcare Providers: https://www.woods-mathews.com/ This test is not yet approved or cleared by the [place] FDA and  has been authorized for detection and/or diagnosis of SARS-CoV-2 by FDA under an Emergency Use Authorization (EUA). This EUA will remain  in effect (meaning this test can be used) for the duration of the COVID-19 declaration under Section 56 4(b)(1) of the Act, 21 U.S.C. section 360bbb-3(b)(1), unless the authorization is terminated or revoked sooner. Performed at [facility], [place] [address], [place],  [address]     Radiology Studies: No results found.   35 minutes with more than 50% spent in reviewing records, counseling patient and coordinating  care.  Nekia Maxham T. Lavina  If 7PM-7AM, please contact night-coverage [email or web address] Password [email or web address] 03/04/2019, 2:20 PM

## 2019-03-04 NOTE — TOC Progression Note (Signed)
[facility]) - Progression Note    Patient Details  Name: Darryl Diaz MRN: [id number] Date of Birth: Apr 19, 1941  Transition of Care [facility] CM/SW [place], [place] Phone Number: 03/04/2019, 11:10 AM  Clinical Narrative:    CSW spoke with Whitney at [facility], they are able to take pt however need a new COVID swab. Paged MD with this request.    Expected Discharge Plan: Skilled Nursing Facility Barriers to Discharge: Continued Medical Work up  Expected Discharge Plan and Services Expected Discharge Plan: [place] Choice: [place] arrangements for the past 2 months: Single Family Home   Social Determinants of Health (SDOH) Interventions    Readmission Risk Interventions No flowsheet data found.

## 2019-03-04 NOTE — Progress Notes (Signed)
Progress Note  Patient Name: Darryl Diaz Date of Encounter: 03/04/2019  Primary Cardiologist:   Sherren Mocha, MD   Subjective   Feeling well ambulated 3 x yesterday.   Inpatient Medications    Scheduled Meds: . amiodarone  200 mg Oral BID  . apixaban  5 mg Oral BID  . atorvastatin  20 mg Oral Daily  . diltiazem  360 mg Oral Daily  . escitalopram  10 mg Oral Daily  . ferrous sulfate  325 mg Oral Daily  . folic acid  1 mg Oral Daily  . furosemide  40 mg Oral Daily  . loratadine  10 mg Oral Daily  . magnesium oxide  400 mg Oral BID  . metoprolol  200 mg Oral Daily  . miconazole nitrate   Topical TID  . multivitamin with minerals  1 tablet Oral Daily  . pantoprazole  40 mg Oral BID AC  . potassium chloride  40 mEq Oral Daily  . silver sulfADIAZINE   Topical BID  . tamsulosin  0.4 mg Oral Daily  . vitamin B-1  250 mg Oral Daily  . traZODone  50 mg Oral QHS   Continuous Infusions:  PRN Meds: acetaminophen   Vital Signs    Vitals:   03/03/19 2034 03/03/19 2340 03/04/19 0334 03/04/19 0500  BP: 127/75 93/68 107/76   Pulse: 100 99 89   Resp: 12 14 16    Temp: 98.8 F (37.1 C) 97.8 F (36.6 C) 97.9 F (36.6 C)   TempSrc: Oral Oral Oral   SpO2: 100% 99% 97%   Weight:    111.7 kg  Height:        Intake/Output Summary (Last 24 hours) at 03/04/2019 0759 Last data filed at 03/04/2019 0500 Gross per 24 hour  Intake 600 ml  Output 900 ml  Net -300 ml   Filed Weights   03/02/19 0259 03/03/19 0305 03/04/19 0500  Weight: 112.9 kg 111.1 kg 111.7 kg    Telemetry    NSR with controlled ventricular rate for the most part - Personally Reviewed  ECG    NA - Personally Reviewed  Physical Exam   GEN: No acute distress.   Neck: No  JVD Cardiac: Irregular RR, no murmurs, rubs, or gallops.  Respiratory: Clear  to auscultation bilaterally. GI: Soft, nontender, non-distended  MS:   Mild leg edema; No deformity. Neuro:  Nonfocal  Psych: Normal affect    Labs    Chemistry Recent Labs  Lab 02/28/19 0314 03/01/19 0226 03/02/19 0228 03/04/19 0222  NA 127* 132* 131* 131*  K 3.2* 3.6 3.4* 3.8  CL 88* 95* 96* 98  CO2 24 23 25 23   GLUCOSE 396* 133* 104* 100*  BUN 10 16 17 16   CREATININE 1.24 1.25* 1.22 1.17  CALCIUM 7.6* 8.5* 8.5* 8.5*  PROT 5.1* 5.3* 5.3*  --   ALBUMIN 2.7* 2.8* 2.9*  --   AST 81* 70* 66*  --   ALT 49* 45* 43  --   ALKPHOS 98 98 96  --   BILITOT 2.0* 1.7* 1.8*  --   GFRNONAA 56* 55* 57* 60*  GFRAA >60 >60 >60 >60  ANIONGAP 15 14 10 10      Hematology Recent Labs  Lab 02/28/19 0314 03/01/19 0226 03/02/19 0228  WBC 6.5 6.8 6.5  RBC 2.68* 2.77* 2.86*  HGB 8.6* 8.9* 9.2*  HCT 26.1* 27.1* 27.5*  MCV 97.4 97.8 96.2  MCH 32.1 32.1 32.2  MCHC 33.0 32.8 33.5  RDW  18.5* 18.6* 18.1*  PLT 133* 143* 172    Cardiac EnzymesNo results for input(s): TROPONINI in the last 168 hours. No results for input(s): TROPIPOC in the last 168 hours.   BNP Recent Labs  Lab 02/25/19 1735  BNP 209.0*     DDimer No results for input(s): DDIMER in the last 168 hours.   Radiology    No results found.  Cardiac Studies   Echo 02/26/19:  IMPRESSIONS  1. Left ventricular ejection fraction, by visual estimation, is 60 to 65%. The left ventricle has normal function. Normal left ventricular size. There is no left ventricular hypertrophy. 2. Global right ventricle has normal systolic function.The right ventricular size is normal. No increase in right ventricular wall thickness. 3. Left atrial size was mildly dilated. 4. Right atrial size was normal. 5. Mild mitral annular calcification. 6. Mild thickening of the anterior and posterior mitral valve leaflet(s). 7. Mild to moderate aortic valve annular calcification. 8. The mitral valve is normal in structure. No evidence of mitral valve regurgitation. No evidence of mitral stenosis. 9. The tricuspid valve is normal in structure. Tricuspid valve regurgitation is  trivial. 10. The aortic valve is tricuspid Aortic valve regurgitation was not visualized by color flow Doppler. Mild to moderate aortic valve sclerosis/calcification without any evidence of aortic stenosis. 11. The pulmonic valve was normal in structure. Pulmonic valve regurgitation is trivial by color flow Doppler. 12. Aortic dilatation noted. 13. There is mild dilatation of the aortic root measuring 38 mm. 14. Moderately elevated pulmonary artery systolic pressure. 15. The inferior vena cava is normal in size with greater than 50% respiratory variability, suggesting right atrial pressure of 3 mmHg.  Patient Profile     78 y.o. male with PAF, hyeprtension, hyperlipidemia, GERD, prior EtOH abuse admitted with GI bleed and falls.   Assessment & Plan    Atrial fibrillation with RVR:   Rate seems to be well controlled on meds as above.  No change in therapy.   Will change to once daily amio before discharge.  He will go back to Dr. Burt Knack for follow up and if still in atrial fib in the future could be considered for DCCV.   GI bleed:  Hgb has been stable.   Watch Hgb closely as an out patient by his PCP.    Hypertension:   BP has been low but tolerating meds as listed.     Acute on chronic diastolic heart failure:  Net negative 2.7 liters this admission.   Continue current oral diuretic.   Hypokalemia/hyponatremia:   Potassium supplemented and Na is stable.  Follow.   For questions or updates, please contact [place] Please consult [email or web address] for contact info under Cardiology/STEMI.   Signed, Minus Breeding, MD  03/04/2019, 7:59 AM

## 2019-03-05 DIAGNOSIS — I5033 Acute on chronic diastolic (congestive) heart failure: Secondary | ICD-10-CM | POA: Diagnosis not present

## 2019-03-05 DIAGNOSIS — M6281 Muscle weakness (generalized): Secondary | ICD-10-CM | POA: Diagnosis not present

## 2019-03-05 DIAGNOSIS — Z7401 Bed confinement status: Secondary | ICD-10-CM | POA: Diagnosis not present

## 2019-03-05 DIAGNOSIS — N400 Enlarged prostate without lower urinary tract symptoms: Secondary | ICD-10-CM | POA: Diagnosis not present

## 2019-03-05 DIAGNOSIS — R58 Hemorrhage, not elsewhere classified: Secondary | ICD-10-CM | POA: Diagnosis not present

## 2019-03-05 DIAGNOSIS — R2689 Other abnormalities of gait and mobility: Secondary | ICD-10-CM | POA: Diagnosis not present

## 2019-03-05 DIAGNOSIS — K922 Gastrointestinal hemorrhage, unspecified: Secondary | ICD-10-CM | POA: Diagnosis not present

## 2019-03-05 DIAGNOSIS — R600 Localized edema: Secondary | ICD-10-CM | POA: Diagnosis not present

## 2019-03-05 DIAGNOSIS — I130 Hypertensive heart and chronic kidney disease with heart failure and stage 1 through stage 4 chronic kidney disease, or unspecified chronic kidney disease: Secondary | ICD-10-CM | POA: Diagnosis not present

## 2019-03-05 DIAGNOSIS — Z7901 Long term (current) use of anticoagulants: Secondary | ICD-10-CM | POA: Diagnosis not present

## 2019-03-05 DIAGNOSIS — K219 Gastro-esophageal reflux disease without esophagitis: Secondary | ICD-10-CM | POA: Diagnosis not present

## 2019-03-05 DIAGNOSIS — R0600 Dyspnea, unspecified: Secondary | ICD-10-CM | POA: Diagnosis not present

## 2019-03-05 DIAGNOSIS — D500 Iron deficiency anemia secondary to blood loss (chronic): Secondary | ICD-10-CM | POA: Diagnosis not present

## 2019-03-05 DIAGNOSIS — D62 Acute posthemorrhagic anemia: Secondary | ICD-10-CM | POA: Diagnosis not present

## 2019-03-05 DIAGNOSIS — F329 Major depressive disorder, single episode, unspecified: Secondary | ICD-10-CM | POA: Diagnosis not present

## 2019-03-05 DIAGNOSIS — I480 Paroxysmal atrial fibrillation: Secondary | ICD-10-CM | POA: Diagnosis not present

## 2019-03-05 DIAGNOSIS — E871 Hypo-osmolality and hyponatremia: Secondary | ICD-10-CM | POA: Diagnosis not present

## 2019-03-05 DIAGNOSIS — R531 Weakness: Secondary | ICD-10-CM | POA: Diagnosis not present

## 2019-03-05 DIAGNOSIS — M79642 Pain in left hand: Secondary | ICD-10-CM | POA: Diagnosis not present

## 2019-03-05 DIAGNOSIS — E785 Hyperlipidemia, unspecified: Secondary | ICD-10-CM | POA: Diagnosis not present

## 2019-03-05 DIAGNOSIS — M25571 Pain in right ankle and joints of right foot: Secondary | ICD-10-CM | POA: Diagnosis not present

## 2019-03-05 DIAGNOSIS — R2681 Unsteadiness on feet: Secondary | ICD-10-CM | POA: Diagnosis not present

## 2019-03-05 DIAGNOSIS — N183 Chronic kidney disease, stage 3 unspecified: Secondary | ICD-10-CM | POA: Diagnosis not present

## 2019-03-05 DIAGNOSIS — I4891 Unspecified atrial fibrillation: Secondary | ICD-10-CM | POA: Diagnosis not present

## 2019-03-05 DIAGNOSIS — Z9181 History of falling: Secondary | ICD-10-CM | POA: Diagnosis not present

## 2019-03-05 DIAGNOSIS — M1990 Unspecified osteoarthritis, unspecified site: Secondary | ICD-10-CM | POA: Diagnosis not present

## 2019-03-05 DIAGNOSIS — G4700 Insomnia, unspecified: Secondary | ICD-10-CM | POA: Diagnosis not present

## 2019-03-05 DIAGNOSIS — D649 Anemia, unspecified: Secondary | ICD-10-CM | POA: Diagnosis not present

## 2019-03-05 DIAGNOSIS — M2550 Pain in unspecified joint: Secondary | ICD-10-CM | POA: Diagnosis not present

## 2019-03-05 DIAGNOSIS — Z791 Long term (current) use of non-steroidal anti-inflammatories (NSAID): Secondary | ICD-10-CM | POA: Diagnosis not present

## 2019-03-05 LAB — COMPREHENSIVE METABOLIC PANEL
ALT: 50 U/L — ABNORMAL HIGH (ref 0–44)
AST: 73 U/L — ABNORMAL HIGH (ref 15–41)
Albumin: 2.9 g/dL — ABNORMAL LOW (ref 3.5–5.0)
Alkaline Phosphatase: 84 U/L (ref 38–126)
Anion gap: 9 (ref 5–15)
BUN: 19 mg/dL (ref 8–23)
CO2: 23 mmol/L (ref 22–32)
Calcium: 8.9 mg/dL (ref 8.9–10.3)
Chloride: 104 mmol/L (ref 98–111)
Creatinine, Ser: 1.23 mg/dL (ref 0.61–1.24)
GFR calc Af Amer: >60 mL/min (ref >60)
GFR calc non Af Amer: 56 mL/min — ABNORMAL LOW (ref >60)
Glucose, Bld: 98 mg/dL (ref 70–99)
Potassium: 4 mmol/L (ref 3.5–5.1)
Sodium: 136 mmol/L (ref 135–145)
Total Bilirubin: 1.3 mg/dL — ABNORMAL HIGH (ref 0.3–1.2)
Total Protein: 5.8 g/dL — ABNORMAL LOW (ref 6.5–8.1)

## 2019-03-05 LAB — HEMOGLOBIN AND HEMATOCRIT, BLOOD
HCT: 30.8 % — ABNORMAL LOW (ref 39.0–52.0)
Hemoglobin: 10.2 g/dL — ABNORMAL LOW (ref 13.0–17.0)

## 2019-03-05 LAB — CK: Total CK: 23 U/L — ABNORMAL LOW (ref 49–397)

## 2019-03-05 LAB — MAGNESIUM: Magnesium: 1.5 mg/dL — ABNORMAL LOW (ref 1.7–2.4)

## 2019-03-05 MED ORDER — SILVER SULFADIAZINE 1 % EX CREA: TOPICAL_CREAM | Freq: Two times a day (BID) | CUTANEOUS | 0 refills | Status: DC

## 2019-03-05 MED ORDER — PANTOPRAZOLE SODIUM 40 MG PO TBEC: 40.0000 mg | DELAYED_RELEASE_TABLET | Freq: Two times a day (BID) | ORAL | 0 refills | Status: DC

## 2019-03-05 MED ORDER — POTASSIUM CHLORIDE CRYS ER 20 MEQ PO TBCR: 40.0000 meq | EXTENDED_RELEASE_TABLET | Freq: Every day | ORAL | 1 refills | Status: DC

## 2019-03-05 MED ORDER — MICONAZOLE NITRATE POWD: 1.0000 "application " | Freq: Three times a day (TID) | 0 refills | Status: DC

## 2019-03-05 MED ORDER — DILTIAZEM HCL ER COATED BEADS 360 MG PO CP24: 360.0000 mg | ORAL_CAPSULE | Freq: Every day | ORAL | 1 refills | Status: DC

## 2019-03-05 MED ORDER — MAGNESIUM SULFATE 2 GM/50ML IV SOLN
2.0000 g | Freq: Once | INTRAVENOUS | Status: AC
  Administered 2019-03-05: 08:00:00 2 g via INTRAVENOUS
  Filled 2019-03-05: qty 50

## 2019-03-05 MED ORDER — AMIODARONE HCL 200 MG PO TABS: 200.0000 mg | ORAL_TABLET | Freq: Every day | ORAL | 1 refills | Status: DC

## 2019-03-05 MED ORDER — FUROSEMIDE 40 MG PO TABS: 40.0000 mg | ORAL_TABLET | Freq: Every day | ORAL | 1 refills | Status: DC

## 2019-03-05 NOTE — Progress Notes (Signed)
Progress Note  Patient Name: Darryl Diaz Date of Encounter: 03/05/2019  Primary Cardiologist:   Sherren Mocha, MD   Subjective   Ready to go home.  No pain.  No SOB.  Inpatient Medications    Scheduled Meds: . amiodarone  200 mg Oral BID  . apixaban  5 mg Oral BID  . atorvastatin  20 mg Oral Daily  . diltiazem  360 mg Oral Daily  . escitalopram  10 mg Oral Daily  . ferrous sulfate  325 mg Oral Daily  . folic acid  1 mg Oral Daily  . furosemide  40 mg Oral Daily  . loratadine  10 mg Oral Daily  . magnesium oxide  400 mg Oral BID  . metoprolol  200 mg Oral Daily  . miconazole nitrate   Topical TID  . multivitamin with minerals  1 tablet Oral Daily  . pantoprazole  40 mg Oral BID AC  . potassium chloride  40 mEq Oral Daily  . silver sulfADIAZINE   Topical BID  . tamsulosin  0.4 mg Oral Daily  . vitamin B-1  250 mg Oral Daily  . traZODone  50 mg Oral QHS   Continuous Infusions: . magnesium sulfate bolus IVPB 2 g (03/05/19 0757)   PRN Meds: acetaminophen   Vital Signs    Vitals:   03/04/19 1913 03/04/19 2333 03/05/19 0345 03/05/19 0730  BP: 100/63 118/79 115/71 121/68  Pulse: 71 (!) 102 66   Resp: (!) 22 (!) 25 (!) 29   Temp: 98 F (36.7 C) 98.3 F (36.8 C) 99 F (37.2 C) 97.8 F (36.6 C)  TempSrc: Oral Oral Oral Oral  SpO2: 100% 99% 98% 98%  Weight:   111.3 kg   Height:        Intake/Output Summary (Last 24 hours) at 03/05/2019 0819 Last data filed at 03/05/2019 0734 Gross per 24 hour  Intake 120 ml  Output 1060 ml  Net -940 ml   Filed Weights   03/03/19 0305 03/04/19 0500 03/05/19 0345  Weight: 111.1 kg 111.7 kg 111.3 kg    Telemetry    NSR - Personally Reviewed  ECG    NA - Personally Reviewed  Physical Exam   GEN: No  acute distress.   Neck: No  JVD Cardiac: Irregular RR, no murmurs, rubs, or gallops.  Respiratory: Clear   to auscultation bilaterally. GI: Soft, nontender, non-distended, normal bowel sounds  MS:  No edema; No  deformity. Neuro:   Nonfocal  Psych: Oriented and appropriate    Labs    Chemistry Recent Labs  Lab 03/01/19 0226 03/02/19 0228 03/04/19 0222 03/05/19 0338  NA 132* 131* 131* 136  K 3.6 3.4* 3.8 4.0  CL 95* 96* 98 104  CO2 23 25 23 23   GLUCOSE 133* 104* 100* 98  BUN 16 17 16 19   CREATININE 1.25* 1.22 1.17 1.23  CALCIUM 8.5* 8.5* 8.5* 8.9  PROT 5.3* 5.3*  --  5.8*  ALBUMIN 2.8* 2.9*  --  2.9*  AST 70* 66*  --  73*  ALT 45* 43  --  50*  ALKPHOS 98 96  --  84  BILITOT 1.7* 1.8*  --  1.3*  GFRNONAA 55* 57* 60* 56*  GFRAA >60 >60 >60 >60  ANIONGAP 14 10 10 9      Hematology Recent Labs  Lab 02/28/19 0314 03/01/19 0226 03/02/19 0228 03/05/19 0338  WBC 6.5 6.8 6.5  --   RBC 2.68* 2.77* 2.86*  --  HGB 8.6* 8.9* 9.2* 10.2*  HCT 26.1* 27.1* 27.5* 30.8*  MCV 97.4 97.8 96.2  --   MCH 32.1 32.1 32.2  --   MCHC 33.0 32.8 33.5  --   RDW 18.5* 18.6* 18.1*  --   PLT 133* 143* 172  --     Cardiac EnzymesNo results for input(s): TROPONINI in the last 168 hours. No results for input(s): TROPIPOC in the last 168 hours.   BNP No results for input(s): BNP, PROBNP in the last 168 hours.   DDimer No results for input(s): DDIMER in the last 168 hours.   Radiology    No results found.  Cardiac Studies   Echo 02/26/19:  IMPRESSIONS  1. Left ventricular ejection fraction, by visual estimation, is 60 to 65%. The left ventricle has normal function. Normal left ventricular size. There is no left ventricular hypertrophy. 2. Global right ventricle has normal systolic function.The right ventricular size is normal. No increase in right ventricular wall thickness. 3. Left atrial size was mildly dilated. 4. Right atrial size was normal. 5. Mild mitral annular calcification. 6. Mild thickening of the anterior and posterior mitral valve leaflet(s). 7. Mild to moderate aortic valve annular calcification. 8. The mitral valve is normal in structure. No evidence of mitral valve  regurgitation. No evidence of mitral stenosis. 9. The tricuspid valve is normal in structure. Tricuspid valve regurgitation is trivial. 10. The aortic valve is tricuspid Aortic valve regurgitation was not visualized by color flow Doppler. Mild to moderate aortic valve sclerosis/calcification without any evidence of aortic stenosis. 11. The pulmonic valve was normal in structure. Pulmonic valve regurgitation is trivial by color flow Doppler. 12. Aortic dilatation noted. 13. There is mild dilatation of the aortic root measuring 38 mm. 14. Moderately elevated pulmonary artery systolic pressure. 15. The inferior vena cava is normal in size with greater than 50% respiratory variability, suggesting right atrial pressure of 3 mmHg.  Patient Profile     78 y.o. male with PAF, hyeprtension, hyperlipidemia, GERD, prior EtOH abuse admitted with GI bleed and falls.   Assessment & Plan    Atrial fibrillation with RVR:   Change to once daily amiodarone.  OK for discharge.  Tolerating Eliquis.    GI bleed:  Hgb increased today.  Follow.     Hypertension:   BP OK.  Tolerating current meds.   Acute on chronic diastolic heart failure:  Net negative 3.6 liters this admission.   Continue current therapy.    For questions or updates, please contact [place] Please consult [email or web address] for contact info under Cardiology/STEMI.   Signed, Minus Breeding, MD  03/05/2019, 8:19 AM

## 2019-03-05 NOTE — Discharge Summary (Signed)
Physician Discharge Summary  Darryl Diaz HDQ:[id number] DOB: [birth year] DOA: 02/25/2019  PCP: [name], MD  Admit date: 02/25/2019 Discharge date: 03/05/2019  Admitted From: Home Disposition: SNF  Recommendations for Outpatient Follow-up:  1. Follow up with PCP and cardiology in 1-2 weeks 2. Please obtain CBC/CMP/Mag at follow up/1 week 3. Please follow up on the following pending results: None  Home Health: Not applicable Equipment/Devices: Not applicable  Discharge Condition: Stable CODE STATUS: Full code   Hospital Course: 39yom w PAF on eliquis, HTN, HLD, insomnia,,depression,GERD, osteoarthritis presenting with low Hgb to 6.0, recent falls, DOE and bilateral LE.  Patient had blood work done at PCP and was told that Hgb 6.0 and was advised to go to ED.  Baseline Hgb 11.  About 8 months ago.  Reportedly had 3 falls in the last week.  In ER, hypoxic to the 80s on RA.  Hgb 6.9.  FOBT positive.  Eliquis held.  Transfused 2 units.  GI consulted.    Patient had EGD on 10/22 that revealed moderate nonspecific pangastritis and LA Grade A GE junction esophagitis.  Biopsy taken for H. pylori testing.  Hospital course complicated by A. fib with RVR.  Started on Cardizem drip, then on amiodarone drip. Transitioned to oral amiodarone, home Cardizem CD and metoprolol.  Eliquis resumed 10/23.  H&H remained stable.  Evaluated by PT/OT who recommended SNF.  Awaiting on Covid result.  See individual problem list below for more hospital course.  Discharge Diagnoses:  Acute blood loss symptomatic anemia due to GI bleed: H&H stable. -EGD on 10/22 revealed moderate nonspecific pan gastritis and esophagitis. -Eliquis resumed on 10/23 with GI permission.  H&H remained stable. -Continue Protonix 40 mg twice daily -Recheck CBC at follow-up.  Paroxysmal A. fib with RVR: RVR resolved. -Continue amiodarone, Cardizem CD and metoprolol per cardiology. -Outpatient follow-up with  cardiology, Dr. Burt Knack in 1 to 2 weeks  Acute on chronic diastolic CHF: likely due to anemia.  Echo with EF of 60 to 65%.  Has has cardinal symptoms on presentation. Briefly diuresed with IV Lasix.   Then transition to oral Lasix.  Had a net - 3.6 L this admission. -Discharged on oral Lasix 40 mg daily and K-Dur 40 mEq daily. -Monitor fluid status -Salt and fluid restriction to 2 g and 1500 cc a day respectively -Outpatient follow-up with cardiology, Dr. Burt Knack in 1 to 2 weeks -CMP and magnesium at follow-up.  CKD-3: Stable -Recheck CMP at follow-up  Hyponatremia: Stable. -Continue monitoring  Hypokalemia/hypomagnesemia: Hypokalemia resolved.  Hypomagnesemia replenished prior to discharge. -Recheck at follow-up.  Mild transaminitis: CK normal.  No GI symptoms.  Could be due to amiodarone and a statin. -Amiodarone decreased to 200 mg daily on discharge -Would continue statin as he could benefit from this -Recheck CMP at follow-up.  Mild thrombocytopenia: Resolved.  Hyperglycemia without diagnosis of diabetes: A1c 4.7%.  Hyperglycemia resolved.  Falls at home: Felt off balance and fell 3 times last week likely due to anemia. -PT/OT-SNF.  Pressure Injury 03/30/16 Deep Tissue Injury - Purple or maroon localized area of discolored intact skin or blood-filled blister due to damage of underlying soft tissue from pressure and/or shear. (Active)  03/30/16   Location: Ankle  Location Orientation: Left  Staging: Deep Tissue Injury - Purple or maroon localized area of discolored intact skin or blood-filled blister due to damage of underlying soft tissue from pressure and/or shear.  Wound Description (Comments):   Present on Admission: Yes    Discharge  Instructions  Discharge Instructions    (HEART FAILURE PATIENTS) Call MD:  Anytime you have any of the following symptoms: 1) 3 pound weight gain in 24 hours or 5 pounds in 1 week 2) shortness of breath, with or without a dry  hacking cough 3) swelling in the hands, feet or stomach 4) if you have to sleep on extra pillows at night in order to breathe.   Complete by: As directed    Call MD for:   Complete by: As directed    Blood in stool   Call MD for:  difficulty breathing, headache or visual disturbances   Complete by: As directed    Call MD for:  extreme fatigue   Complete by: As directed    Call MD for:  persistant dizziness or light-headedness   Complete by: As directed    Call MD for:  persistant nausea and vomiting   Complete by: As directed    Diet - low sodium heart healthy   Complete by: As directed    Increase activity slowly   Complete by: As directed      Allergies as of 03/05/2019      Reactions   Penicillins Other (See Comments)   Did it involve swelling of the face/tongue/throat, SOB, or low BP? Unknown Did it involve sudden or severe rash/hives, skin peeling, or any reaction on the inside of your mouth or nose? Unknown Did you need to seek medical attention at a hospital or doctor's office? Unknown When did it last happen?childhood reaction If all above answers are NO, may proceed with cephalosporin use.      Medication List    TAKE these medications   acetaminophen 500 MG tablet Commonly known as: TYLENOL Take 1,000 mg by mouth every 6 (six) hours as needed for headache (pain).   amiodarone 200 MG tablet Commonly known as: PACERONE Take 1 tablet (200 mg total) by mouth daily.   apixaban 5 MG Tabs tablet Commonly known as: Eliquis Take 1 tablet (5 mg total) by mouth 2 (two) times daily.   atorvastatin 20 MG tablet Commonly known as: LIPITOR Take 20 mg by mouth daily.   diltiazem 360 MG 24 hr capsule Commonly known as: CARDIZEM CD Take 1 capsule (360 mg total) by mouth daily.   escitalopram 10 MG tablet Commonly known as: LEXAPRO Take 10 mg by mouth daily.   ferrous sulfate 325 (65 FE) MG EC tablet Take 325 mg by mouth daily.   folic acid 1 MG  tablet Commonly known as: FOLVITE Take 1 mg by mouth daily.   furosemide 40 MG tablet Commonly known as: LASIX Take 1 tablet (40 mg total) by mouth daily.   loratadine 10 MG tablet Commonly known as: CLARITIN Take 10 mg by mouth daily.   metoprolol 200 MG 24 hr tablet Commonly known as: TOPROL-XL Take 200 mg by mouth daily.   miconazole nitrate Powd Commonly known as: MICATIN Apply 1 application topically 3 (three) times daily.   multivitamin with minerals Tabs tablet Take 1 tablet by mouth daily.   pantoprazole 40 MG tablet Commonly known as: PROTONIX Take 1 tablet (40 mg total) by mouth 2 (two) times daily before a meal. What changed: when to take this   potassium chloride SA 20 MEQ tablet Commonly known as: KLOR-CON Take 2 tablets (40 mEq total) by mouth daily.   silver sulfADIAZINE 1 % cream Commonly known as: SILVADENE Apply topically 2 (two) times daily.   tamsulosin 0.4 MG Caps  capsule Commonly known as: FLOMAX Take 0.4 mg by mouth.   traZODone 50 MG tablet Commonly known as: DESYREL Take 50 mg by mouth at bedtime.   vitamin B-1 250 MG tablet Take 250 mg by mouth daily.      Contact information for after-discharge care    Destination    [facility] .   Service: Skilled Nursing Contact information: [address] [place] [phone]              Consultations:  GI  Cardiology  Procedures/Studies:  2D Echo: On 02/26/2019 1. Left ventricular ejection fraction, by visual estimation, is 60 to 65%. The left ventricle has normal function. Normal left ventricular size. There is no left ventricular hypertrophy.  2. Global right ventricle has normal systolic function.The right ventricular size is normal. No increase in right ventricular wall thickness.  3. Left atrial size was mildly dilated.  4. Right atrial size was normal.  5. Mild mitral annular calcification.  6. Mild  thickening of the anterior and posterior mitral valve leaflet(s).  7. Mild to moderate aortic valve annular calcification.  8. The mitral valve is normal in structure. No evidence of mitral valve regurgitation. No evidence of mitral stenosis.  9. The tricuspid valve is normal in structure. Tricuspid valve regurgitation is trivial. 10. The aortic valve is tricuspid Aortic valve regurgitation was not visualized by color flow Doppler. Mild to moderate aortic valve sclerosis/calcification without any evidence of aortic stenosis. 11. The pulmonic valve was normal in structure. Pulmonic valve regurgitation is trivial by color flow Doppler. 12. Aortic dilatation noted. 13. There is mild dilatation of the aortic root measuring 38 mm. 14. Moderately elevated pulmonary artery systolic pressure. 15. The inferior vena cava is normal in size with greater than 50% respiratory variability, suggesting right atrial pressure of 3 mmHg.  EGD on 10/22 revealed moderate nonspecific pan gastritis and esophagitis.   Dg Chest 2 View  Result Date: 02/25/2019 CLINICAL DATA:  Shortness of breath. EXAM: CHEST - 2 VIEW COMPARISON:  August 11, 2018. FINDINGS: The heart size and mediastinal contours are within normal limits. Both lungs are clear. No pneumothorax or pleural effusion is noted. The visualized skeletal structures are unremarkable. IMPRESSION: No active cardiopulmonary disease. Electronically Signed   By: Marijo Conception M.D.   On: 02/25/2019 18:27   Ct Head Wo Contrast  Result Date: 02/25/2019 CLINICAL DATA:  Posttraumatic headache after multiple falls. EXAM: CT HEAD WITHOUT CONTRAST TECHNIQUE: Contiguous axial images were obtained from the base of the skull through the vertex without intravenous contrast. COMPARISON:  August 11, 2018. FINDINGS: Brain: Mild diffuse cortical atrophy is noted. Mild chronic ischemic white matter disease is noted. No mass effect or midline shift is noted. Ventricular size is within  normal limits. There is no evidence of mass lesion, hemorrhage or acute infarction. Vascular: No hyperdense vessel or unexpected calcification. Skull: Normal. Negative for fracture or focal lesion. Sinuses/Orbits: No acute finding. Other: None. IMPRESSION: Mild diffuse cortical atrophy. Mild chronic ischemic white matter disease. No acute intracranial abnormality seen. Electronically Signed   By: Marijo Conception M.D.   On: 02/25/2019 18:42   US Abdomen Limited Ruq  Result Date: 02/25/2019 CLINICAL DATA:  Elevated LFTs, fatty liver EXAM: ULTRASOUND ABDOMEN LIMITED RIGHT UPPER QUADRANT COMPARISON:  None. FINDINGS: Gallbladder: Shadowing gallstones are present, the largest measuring 6 mm. No sonographic Murphy sign noted by sonographer. Common bile duct: Diameter: 4.3 mm Liver: No focal lesion  identified. Increased echotexture seen throughout. No focal abnormality or biliary ductal dilatation. portal vein is patent on color Doppler imaging with normal direction of blood flow towards the liver. Other: None. IMPRESSION: Cholelithiasis no evidence of acute cholecystitis. Hepatic steatosis. Electronically Signed   By: Prudencio Pair M.D.   On: 02/25/2019 21:24     Subjective: No major events overnight of this morning.  No complaint this morning.  He denies chest pain, dyspnea, palpitation or dizziness.  No further melena or hematochezia.  Feels well and ready to go to rehab.   Discharge Exam: Vitals:   03/05/19 0345 03/05/19 0730  BP: 115/71 121/68  Pulse: 66   Resp: (!) 29   Temp: 99 F (37.2 C) 97.8 F (36.6 C)  SpO2: 98% 98%    GENERAL: No acute distress.  Appears well.  HEENT: MMM.  Vision and hearing grossly intact.  NECK: Supple.  No apparent JVD.  LUNGS:  No IWOB. Good air movement bilaterally. HEART:  Irregular. Normal rate. Heart sounds normal.  ABD: Bowel sounds present. Soft. Non tender.  MSK/EXT:  Moves all extremities. No apparent deformity. No edema bilaterally. SKIN: small  bruise or wound over his knee. NEURO: Awake, alert and oriented appropriately.  No gross deficit.  PSYCH: Calm. Normal affect.   The results of significant diagnostics from this hospitalization (including imaging, microbiology, ancillary and laboratory) are listed below for reference.     Microbiology: Recent Results (from the past 240 hour(s))  SARS CORONAVIRUS 2 (TAT 6-24 HRS) Nasopharyngeal Nasopharyngeal Swab     Status: None   Collection Time: 02/25/19  7:18 PM   Specimen: Nasopharyngeal Swab  Result Value Ref Range Status   SARS Coronavirus 2 NEGATIVE NEGATIVE Final    Comment: (NOTE) SARS-CoV-2 target nucleic acids are NOT DETECTED. The SARS-CoV-2 RNA is generally detectable in upper and lower respiratory specimens during the acute phase of infection. Negative results do not preclude SARS-CoV-2 infection, do not rule out co-infections with other pathogens, and should not be used as the sole basis for treatment or other patient management decisions. Negative results must be combined with clinical observations, patient history, and epidemiological information. The expected result is Negative. Fact Sheet for Patients: SugarRoll.be Fact Sheet for Healthcare Providers: https://www.woods-mathews.com/ This test is not yet approved or cleared by the [place] FDA and  has been authorized for detection and/or diagnosis of SARS-CoV-2 by FDA under an Emergency Use Authorization (EUA). This EUA will remain  in effect (meaning this test can be used) for the duration of the COVID-19 declaration under Section 56 4(b)(1) of the Act, 21 U.S.C. section 360bbb-3(b)(1), unless the authorization is terminated or revoked sooner. Performed at [facility], [place] [address] [place], [place] [address]   SARS CORONAVIRUS 2 (TAT 6-24 HRS) Nasopharyngeal Nasopharyngeal Swab     Status: None   Collection Time: 03/04/19 12:10 PM   Specimen:  Nasopharyngeal Swab  Result Value Ref Range Status   SARS Coronavirus 2 NEGATIVE NEGATIVE Final    Comment: (NOTE) SARS-CoV-2 target nucleic acids are NOT DETECTED. The SARS-CoV-2 RNA is generally detectable in upper and lower respiratory specimens during the acute phase of infection. Negative results do not preclude SARS-CoV-2 infection, do not rule out co-infections with other pathogens, and should not be used as the sole basis for treatment or other patient management decisions. Negative results must be combined with clinical observations, patient history, and epidemiological information. The expected result is Negative. Fact Sheet for Patients: SugarRoll.be Fact Sheet  for Healthcare Providers: https://www.woods-mathews.com/ This test is not yet approved or cleared by the [place] and  has been authorized for detection and/or diagnosis of SARS-CoV-2 by FDA under an Emergency Use Authorization (EUA). This EUA will remain  in effect (meaning this test can be used) for the duration of the COVID-19 declaration under Section 56 4(b)(1) of the Act, 21 U.S.C. section 360bbb-3(b)(1), unless the authorization is terminated or revoked sooner. Performed at [facility], [place] [address], [place], [place] [address]      Labs: BNP (last 3 results) Recent Labs    02/25/19 1735  BNP 939.0*   Basic Metabolic Panel: Recent Labs  Lab 02/27/19 1618 02/28/19 0314 03/01/19 0226 03/02/19 0228 03/04/19 0222 03/05/19 0338  NA  --  127* 132* 131* 131* 136  K  --  3.2* 3.6 3.4* 3.8 4.0  CL  --  88* 95* 96* 98 104  CO2  --  24 23 25 23 23   GLUCOSE  --  396* 133* 104* 100* 98  BUN  --  10 16 17 16 19   CREATININE  --  1.24 1.25* 1.22 1.17 1.23  CALCIUM  --  7.6* 8.5* 8.5* 8.5* 8.9  MG 1.2*  --   --   --   --  1.5*   Liver Function Tests: Recent Labs  Lab 02/28/19 0314 03/01/19 0226 03/02/19 0228 03/05/19 0338  AST 81* 70* 66*  73*  ALT 49* 45* 43 50*  ALKPHOS 98 98 96 84  BILITOT 2.0* 1.7* 1.8* 1.3*  PROT 5.1* 5.3* 5.3* 5.8*  ALBUMIN 2.7* 2.8* 2.9* 2.9*   No results for input(s): LIPASE, AMYLASE in the last 168 hours. No results for input(s): AMMONIA in the last 168 hours. CBC: Recent Labs  Lab 02/26/19 1616 02/27/19 0443 02/28/19 0314 03/01/19 0226 03/02/19 0228 03/05/19 0338  WBC 5.9 6.6 6.5 6.8 6.5  --   HGB 8.4* 8.6* 8.6* 8.9* 9.2* 10.2*  HCT 24.0* 25.1* 26.1* 27.1* 27.5* 30.8*  MCV 92.7 95.1 97.4 97.8 96.2  --   PLT 112* 116* 133* 143* 172  --    Cardiac Enzymes: Recent Labs  Lab 03/05/19 0338  CKTOTAL 23*   BNP: Invalid input(s): POCBNP CBG: No results for input(s): GLUCAP in the last 168 hours. D-Dimer No results for input(s): DDIMER in the last 72 hours. Hgb A1c No results for input(s): HGBA1C in the last 72 hours. Lipid Profile No results for input(s): CHOL, HDL, LDLCALC, TRIG, CHOLHDL, LDLDIRECT in the last 72 hours. Thyroid function studies No results for input(s): TSH, T4TOTAL, T3FREE, THYROIDAB in the last 72 hours.  Invalid input(s): FREET3 Anemia work up No results for input(s): VITAMINB12, FOLATE, FERRITIN, TIBC, IRON, RETICCTPCT in the last 72 hours. Urinalysis    Component Value Date/Time   COLORURINE YELLOW 08/11/2018 1119   APPEARANCEUR CLEAR 08/11/2018 1119   LABSPEC 1.005 08/11/2018 1119   PHURINE 6.0 08/11/2018 1119   GLUCOSEU NEGATIVE 08/11/2018 1119   HGBUR SMALL (A) 08/11/2018 1119   BILIRUBINUR NEGATIVE 08/11/2018 1119   [place] 08/11/2018 1119   PROTEINUR NEGATIVE 08/11/2018 1119   NITRITE NEGATIVE 08/11/2018 1119   LEUKOCYTESUR NEGATIVE 08/11/2018 1119   Sepsis Labs Invalid input(s): PROCALCITONIN,  WBC,  LACTICIDVEN   Time coordinating discharge: 45 minutes  SIGNED:  Mercy Riding, MD  [facility] 03/05/2019, 9:01 AM  If 7PM-7AM, please contact night-coverage [email or web address] Password [email or web address]

## 2019-03-05 NOTE — TOC Transition Note (Addendum)
Transition of Care [facility]) - CM/SW Discharge Note   Patient Details  Name: Darryl Diaz MRN: [id number] Date of Birth: 1940-07-31  [facility] of [facility] CM/SW Contact:  Alberteen Sam, LCSW Phone Number: 03/05/2019, 9:32 AM   Clinical Narrative:     Patient will DC AY:[id number] Anticipated DC date: 03/05/2019 Family notified: Patient will inform Transport SE:[id number]  Per MD patient ready for DC to [facility] . RN, patient, patient's family, and facility notified of DC. Discharge Summary sent to facility. RN given number for report   [phone] Room 7003. DC packet on chart. Ambulance transport requested for patient for 11:00 am per nurse request. [place] signing off.  [place], [place]   Final next level of care: Skilled Nursing Facility Barriers to Discharge: No Barriers Identified   Patient Goals and CMS Choice Patient states their goals for this hospitalization and ongoing recovery are:: to go to rehab then home CMS Medicare.gov Compare Post Acute Care list provided to:: Patient Choice offered to / list presented to : Patient  Discharge Placement PASRR number recieved: 03/01/19            Patient chooses bed at: [facility] Patient to be transferred to facility by: [place] Name of family member notified: N/A Patient and family notified of of transfer: 03/05/19  Discharge Plan and Services     Post Acute Care Choice: [place]                               Social Determinants of Health (SDOH) Interventions     Readmission Risk Interventions No flowsheet data found.

## 2019-03-05 NOTE — Progress Notes (Signed)
Report to [place], Therapist, sports. All questions were answered.

## 2019-03-06 DIAGNOSIS — M25571 Pain in right ankle and joints of right foot: Secondary | ICD-10-CM | POA: Diagnosis not present

## 2019-03-06 DIAGNOSIS — I480 Paroxysmal atrial fibrillation: Secondary | ICD-10-CM | POA: Diagnosis not present

## 2019-03-06 DIAGNOSIS — M79642 Pain in left hand: Secondary | ICD-10-CM | POA: Diagnosis not present

## 2019-03-06 DIAGNOSIS — Z7901 Long term (current) use of anticoagulants: Secondary | ICD-10-CM | POA: Diagnosis not present

## 2019-03-06 DIAGNOSIS — R0600 Dyspnea, unspecified: Secondary | ICD-10-CM | POA: Diagnosis not present

## 2019-03-06 DIAGNOSIS — D500 Iron deficiency anemia secondary to blood loss (chronic): Secondary | ICD-10-CM | POA: Diagnosis not present

## 2019-03-10 DIAGNOSIS — M6281 Muscle weakness (generalized): Secondary | ICD-10-CM | POA: Diagnosis not present

## 2019-03-10 DIAGNOSIS — N400 Enlarged prostate without lower urinary tract symptoms: Secondary | ICD-10-CM | POA: Diagnosis not present

## 2019-03-10 DIAGNOSIS — R2689 Other abnormalities of gait and mobility: Secondary | ICD-10-CM | POA: Diagnosis not present

## 2019-03-10 DIAGNOSIS — K219 Gastro-esophageal reflux disease without esophagitis: Secondary | ICD-10-CM | POA: Diagnosis not present

## 2019-03-10 DIAGNOSIS — R531 Weakness: Secondary | ICD-10-CM | POA: Diagnosis not present

## 2019-03-10 DIAGNOSIS — I5033 Acute on chronic diastolic (congestive) heart failure: Secondary | ICD-10-CM | POA: Diagnosis not present

## 2019-03-10 DIAGNOSIS — I130 Hypertensive heart and chronic kidney disease with heart failure and stage 1 through stage 4 chronic kidney disease, or unspecified chronic kidney disease: Secondary | ICD-10-CM | POA: Diagnosis not present

## 2019-03-10 DIAGNOSIS — D62 Acute posthemorrhagic anemia: Secondary | ICD-10-CM | POA: Diagnosis not present

## 2019-03-10 DIAGNOSIS — K922 Gastrointestinal hemorrhage, unspecified: Secondary | ICD-10-CM | POA: Diagnosis not present

## 2019-03-10 DIAGNOSIS — N183 Chronic kidney disease, stage 3 unspecified: Secondary | ICD-10-CM | POA: Diagnosis not present

## 2019-03-10 DIAGNOSIS — E785 Hyperlipidemia, unspecified: Secondary | ICD-10-CM | POA: Diagnosis not present

## 2019-03-10 DIAGNOSIS — F329 Major depressive disorder, single episode, unspecified: Secondary | ICD-10-CM | POA: Diagnosis not present

## 2019-03-10 DIAGNOSIS — Z791 Long term (current) use of non-steroidal anti-inflammatories (NSAID): Secondary | ICD-10-CM | POA: Diagnosis not present

## 2019-03-10 DIAGNOSIS — Z9181 History of falling: Secondary | ICD-10-CM | POA: Diagnosis not present

## 2019-03-10 DIAGNOSIS — G4700 Insomnia, unspecified: Secondary | ICD-10-CM | POA: Diagnosis not present

## 2019-03-10 DIAGNOSIS — R2681 Unsteadiness on feet: Secondary | ICD-10-CM | POA: Diagnosis not present

## 2019-03-10 DIAGNOSIS — I480 Paroxysmal atrial fibrillation: Secondary | ICD-10-CM | POA: Diagnosis not present

## 2019-03-10 DIAGNOSIS — M1990 Unspecified osteoarthritis, unspecified site: Secondary | ICD-10-CM | POA: Diagnosis not present

## 2019-03-13 DIAGNOSIS — D62 Acute posthemorrhagic anemia: Secondary | ICD-10-CM | POA: Diagnosis not present

## 2019-03-13 DIAGNOSIS — R531 Weakness: Secondary | ICD-10-CM | POA: Diagnosis not present

## 2019-03-13 DIAGNOSIS — F329 Major depressive disorder, single episode, unspecified: Secondary | ICD-10-CM | POA: Diagnosis not present

## 2019-03-13 DIAGNOSIS — Z9181 History of falling: Secondary | ICD-10-CM | POA: Diagnosis not present

## 2019-03-13 DIAGNOSIS — K2100 Gastro-esophageal reflux disease with esophagitis, without bleeding: Secondary | ICD-10-CM | POA: Diagnosis not present

## 2019-03-13 DIAGNOSIS — I5033 Acute on chronic diastolic (congestive) heart failure: Secondary | ICD-10-CM | POA: Diagnosis not present

## 2019-03-13 DIAGNOSIS — I480 Paroxysmal atrial fibrillation: Secondary | ICD-10-CM | POA: Diagnosis not present

## 2019-03-13 DIAGNOSIS — M1990 Unspecified osteoarthritis, unspecified site: Secondary | ICD-10-CM | POA: Diagnosis not present

## 2019-03-13 DIAGNOSIS — Z87891 Personal history of nicotine dependence: Secondary | ICD-10-CM | POA: Diagnosis not present

## 2019-03-13 DIAGNOSIS — K2900 Acute gastritis without bleeding: Secondary | ICD-10-CM | POA: Diagnosis not present

## 2019-03-13 DIAGNOSIS — Z7901 Long term (current) use of anticoagulants: Secondary | ICD-10-CM | POA: Diagnosis not present

## 2019-03-13 DIAGNOSIS — N183 Chronic kidney disease, stage 3 unspecified: Secondary | ICD-10-CM | POA: Diagnosis not present

## 2019-03-13 DIAGNOSIS — E785 Hyperlipidemia, unspecified: Secondary | ICD-10-CM | POA: Diagnosis not present

## 2019-03-13 DIAGNOSIS — I130 Hypertensive heart and chronic kidney disease with heart failure and stage 1 through stage 4 chronic kidney disease, or unspecified chronic kidney disease: Secondary | ICD-10-CM | POA: Diagnosis not present

## 2019-03-14 DIAGNOSIS — D500 Iron deficiency anemia secondary to blood loss (chronic): Secondary | ICD-10-CM | POA: Diagnosis not present

## 2019-03-14 DIAGNOSIS — K219 Gastro-esophageal reflux disease without esophagitis: Secondary | ICD-10-CM | POA: Diagnosis not present

## 2019-03-14 DIAGNOSIS — I5032 Chronic diastolic (congestive) heart failure: Secondary | ICD-10-CM

## 2019-03-14 DIAGNOSIS — I480 Paroxysmal atrial fibrillation: Secondary | ICD-10-CM | POA: Diagnosis not present

## 2019-03-14 DIAGNOSIS — Z7901 Long term (current) use of anticoagulants: Secondary | ICD-10-CM | POA: Diagnosis not present

## 2019-03-14 DIAGNOSIS — R5381 Other malaise: Secondary | ICD-10-CM | POA: Diagnosis not present

## 2019-03-15 DIAGNOSIS — R5381 Other malaise: Secondary | ICD-10-CM | POA: Diagnosis not present

## 2019-03-15 DIAGNOSIS — I130 Hypertensive heart and chronic kidney disease with heart failure and stage 1 through stage 4 chronic kidney disease, or unspecified chronic kidney disease: Secondary | ICD-10-CM | POA: Diagnosis not present

## 2019-03-15 DIAGNOSIS — K2900 Acute gastritis without bleeding: Secondary | ICD-10-CM | POA: Diagnosis not present

## 2019-03-15 DIAGNOSIS — I5032 Chronic diastolic (congestive) heart failure: Secondary | ICD-10-CM | POA: Diagnosis not present

## 2019-03-15 DIAGNOSIS — N183 Chronic kidney disease, stage 3 unspecified: Secondary | ICD-10-CM | POA: Diagnosis not present

## 2019-03-15 DIAGNOSIS — I5033 Acute on chronic diastolic (congestive) heart failure: Secondary | ICD-10-CM | POA: Diagnosis not present

## 2019-03-15 DIAGNOSIS — K2100 Gastro-esophageal reflux disease with esophagitis, without bleeding: Secondary | ICD-10-CM | POA: Diagnosis not present

## 2019-03-15 DIAGNOSIS — D500 Iron deficiency anemia secondary to blood loss (chronic): Secondary | ICD-10-CM | POA: Diagnosis not present

## 2019-03-15 DIAGNOSIS — D62 Acute posthemorrhagic anemia: Secondary | ICD-10-CM | POA: Diagnosis not present

## 2019-03-18 ENCOUNTER — Other Ambulatory Visit: Payer: Self-pay | Admitting: *Deleted

## 2019-03-18 DIAGNOSIS — I10 Essential (primary) hypertension: Secondary | ICD-10-CM

## 2019-03-18 NOTE — Patient Outreach (Signed)
Member assessed for potential [facility] Care Management needs as a benefit of [place] Medicare.  Mr. Blaylock previously discharged from [facility] SNF to home with [facility] home health services.   Member discharged prior to writer's outreach.  Will make referral to [facility] for follow up. Per chart records, member lived alone prior to admission. Has a history of multiple falls, CHF, CKD, HTN, HLD, AFIB.    [name], MSN-Ed, RN,BSN [place] Acute Care Coordinator [phone] [facility]) [phone]  (Toll free office)

## 2019-03-18 NOTE — Progress Notes (Signed)
Cardiology Office Note    Date:  03/19/2019   ID:  Darryl Diaz, DOB [birth year], MRN [id number]  PCP:  [name], MD  Cardiologist:  Dr. Burt Knack  Chief Complaint: Hospital follow up  History of Present Illness:   Darryl Diaz is a 78 y.o. male PAF, hyeprtension, hyperlipidemia, GERD, prior EtOH abuse (quit 2017) presents for follow up.   Patient was admitted in 08/2018 with acute posthemorrhagic anemia after presenting with right arm and right-sided flank bruising following a mechanical fall. Hemoglobin as low as 7.9 at that time. No internal bleeding and no signs of active bleeding anywhere. Patient was discharged with instructions to hold Eliquis for 2 weeks. H  He was admitted 02/2019 with GI bleed and fall.  EGD on 10/22 showed Mallory Weiss tear and erosions/erythema in stomach.  Concern for EtOH related gastritis. Hospital course complicated by A. fib with RVR.Started on Cardizem drip, then on amiodarone drip. Transitionedto oral amiodarone, home Cardizem CD and metoprolol. Net diuresed 3.6L.  He was deemed stable to resume anticoagulation on 10/23 and PPI was increased.  Seen today for follow up.  Hemoglobin 10.6 and creatinine 1.2 on 03/15/2019.  He denies blood in his stool.  Compliant with medication.  Does eat high salt diet.  He has gained 7 pounds since discharge.  He has intermittent shortness of breath without chest pain.  Has lower extremity edema and orthopnea.  No dizziness, syncope, fall or palpitations.  Past Medical History:  Diagnosis Date  . 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 Atrial fibrillation ([place])   . Fall at home 03/28/2016  . GERD (gastroesophageal reflux disease)   . H/O seasonal allergies   . Hx of adenomatous colonic polyps 08/10/2017  . Hyperlipidemia   . Hypertension   . Insomnia   . 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 OA (osteoarthritis)    hands    Past Surgical History:  Procedure  Laterality Date  . ANKLE SURGERY Left 2014   had rods put in   . BIOPSY  02/28/2019   Procedure: BIOPSY;  Surgeon: Milus Banister, MD;  Location: [facility] ENDOSCOPY;  Service: Endoscopy;;  . [place]  . ESOPHAGOGASTRODUODENOSCOPY (EGD) WITH PROPOFOL N/A 02/28/2019   Procedure: ESOPHAGOGASTRODUODENOSCOPY (EGD) WITH PROPOFOL;  Surgeon: Milus Banister, MD;  [facility] ENDOSCOPY;  Service: Endoscopy;  Laterality: N/A;  . HIP ARTHROPLASTY Left 04/01/2016   Procedure: ARTHROPLASTY BIPOLAR HIP (HEMIARTHROPLASTY);  Surgeon: Paralee Cancel, MD;  Location: [place];  Service: Orthopedics;  Laterality: Left;  . HIP CLOSED REDUCTION Left 04/30/2016   Procedure: CLOSED REDUCTION HIP;  Surgeon: Wylene Simmer, MD;  Location: [place];  Service: Orthopedics;  Laterality: Left;  . TONSILLECTOMY      Current Medications: Prior to Admission medications   Medication Sig Start Date End Date Taking? Authorizing Provider  acetaminophen (TYLENOL) 500 MG tablet Take 1,000 mg by mouth every 6 (six) hours as needed for headache (pain).    [provider]  amiodarone (PACERONE) 200 MG tablet Take 1 tablet (200 mg total) by mouth daily. 03/05/19   Mercy Riding, MD  apixaban (ELIQUIS) 5 MG TABS tablet Take 1 tablet (5 mg total) by mouth 2 (two) times daily. 08/24/18   Geradine Girt, DO  atorvastatin (LIPITOR) 20 MG tablet Take 20 mg by mouth daily.    [provider]  diltiazem (CARDIZEM CD) 360 MG 24 hr capsule  Take 1 capsule (360 mg total) by mouth daily. 03/05/19   Mercy Riding, MD  escitalopram (LEXAPRO) 10 MG tablet Take 10 mg by mouth daily.    [provider]  ferrous sulfate 325 (65 FE) MG EC tablet Take 325 mg by mouth daily.     [provider]  folic acid (FOLVITE) 1 MG tablet Take 1 mg by mouth daily.    [provider]  furosemide (LASIX) 40 MG tablet Take 1 tablet (40 mg total) by mouth daily. 03/05/19   Mercy Riding, MD  loratadine (CLARITIN) 10  MG tablet Take 10 mg by mouth daily.    [provider]  metoprolol (TOPROL-XL) 200 MG 24 hr tablet Take 200 mg by mouth daily.    [provider]  miconazole nitrate (MICATIN) POWD Apply 1 application topically 3 (three) times daily. 03/05/19   Mercy Riding, MD  Multiple Vitamin (MULTIVITAMIN WITH MINERALS) TABS tablet Take 1 tablet by mouth daily.    [provider]  pantoprazole (PROTONIX) 40 MG tablet Take 1 tablet (40 mg total) by mouth 2 (two) times daily before a meal. 03/05/19   Gonfa, Charlesetta Ivory, MD  potassium chloride SA (KLOR-CON) 20 MEQ tablet Take 2 tablets (40 mEq total) by mouth daily. 03/05/19   Mercy Riding, MD  silver sulfADIAZINE (SILVADENE) 1 % cream Apply topically 2 (two) times daily. 03/05/19   Mercy Riding, MD  tamsulosin (FLOMAX) 0.4 MG CAPS capsule Take 0.4 mg by mouth.    [provider]  Thiamine HCl (VITAMIN B-1) 250 MG tablet Take 250 mg by mouth daily.    [provider]  traZODone (DESYREL) 50 MG tablet Take 50 mg by mouth at bedtime.    [provider]    Allergies:   Penicillins   Social History   Socioeconomic History  . Marital status: Widowed    Spouse name: Not on file  . Number of children: 2  . Years of education: college  . Highest education level: Not on file  Occupational History  . Occupation: retired  Scientific laboratory technician  . Financial resource strain: Not on file  . Food insecurity    Worry: Not on file    Inability: Not on file  . Transportation needs    Medical: Not on file    Non-medical: Not on file  Tobacco Use  . Smoking status: Former Research scientist (life sciences)  . Smokeless tobacco: Never Used  Substance and Sexual Activity  . Alcohol use: No    Comment: 03/2016   i HAD ONE DRINK & THAT WAS THE FIRST DRINK i HAD IN A LONG TIME   . Drug use: No  . Sexual activity: Not on file  Lifestyle  . Physical activity    Days per week: Not on file    Minutes per session: Not on file  . Stress: Not on file   Relationships  . Social Herbalist on phone: Not on file    Gets together: Not on file    Attends religious service: Not on file    Active member of club or organization: Not on file    Attends meetings of clubs or organizations: Not on file    Relationship status: Not on file  Other Topics Concern  . 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  ROS:   Please see the history of present illness.    ROS All other systems reviewed and are negative.   PHYSICAL EXAM:   VS:  BP 118/78   Pulse 84   Ht 6' (1.829 m)   Wt 250 lb 9.6 oz (113.7 kg)   SpO2 98%   BMI 33.99 kg/m    GEN: Well nourished, well developed, in no acute distress  HEENT: normal  Neck: no JVD, carotid bruits, or masses Cardiac: RRR; no murmurs, rubs, or gallops, 1-2+ bilateral lower extremity edema Respiratory:  clear to auscultation bilaterally, normal work of breathing GI: soft, nontender, nondistended, + BS MS: no deformity or atrophy  Skin: warm and dry, no rash Neuro:  Alert and Oriented x 3, Strength and sensation are intact Psych: euthymic mood, full affect  Wt Readings from Last 3 Encounters:  03/19/19 250 lb 9.6 oz (113.7 kg)  03/05/19 245 lb 6 oz (111.3 kg)  08/11/18 257 lb 15 oz (117 kg)      Studies/Labs Reviewed:   EKG:  EKG is ordered today.  The ekg ordered today  demonstrates normal sinus rhythm at rate of 54 bpm  Recent Labs: 02/25/2019: B Natriuretic Peptide 209.0 02/27/2019: TSH 1.737 03/02/2019: Platelets 172 03/05/2019: ALT 50; BUN 19; Creatinine, Ser 1.23; Hemoglobin 10.2; Magnesium 1.5; Potassium 4.0; Sodium 136   Lipid Panel    Component Value Date/Time   CHOL 81 02/28/2019 0314   TRIG 27 02/28/2019 0314   HDL 47 02/28/2019 0314   CHOLHDL 1.7 02/28/2019 0314   VLDL 5 02/28/2019 0314   LDLCALC 29 02/28/2019 0314    Additional studies/ records that were reviewed today include:   Echocardiogram: 02/2019  1. Left ventricular ejection fraction, by visual estimation, is 60 to 65%. The left ventricle has normal function. Normal left ventricular size. There is no left ventricular hypertrophy.  2. Global right ventricle has normal systolic function.The right ventricular size is normal. No increase in right ventricular wall thickness.  3. Left atrial size was mildly dilated.  4. Right atrial size was normal.  5. Mild mitral annular calcification.  6. Mild thickening of the anterior and posterior mitral valve leaflet(s).  7. Mild to moderate aortic valve annular calcification.  8. The mitral valve is normal in structure. No evidence of mitral valve regurgitation. No evidence of mitral stenosis.  9. The tricuspid valve is normal in structure. Tricuspid valve regurgitation is trivial. 10. The aortic valve is tricuspid Aortic valve regurgitation was not visualized by color flow Doppler. Mild to moderate aortic valve sclerosis/calcification without any evidence of aortic stenosis. 11. The pulmonic valve was normal in structure. Pulmonic valve regurgitation is trivial by color flow Doppler. 12. Aortic dilatation noted. 13. There is mild dilatation of the aortic root measuring 38 mm. 14. Moderately elevated pulmonary artery systolic pressure. 15. The inferior vena cava is normal in size with greater than 50% respiratory variability, suggesting  right  atrial pressure of 3 mmHg.   ASSESSMENT & PLAN:    1. Paroxysmal atrial fibrillation Maintaining sinus rhythm.  No bleeding issue.  Continue Eliquis, amiodarone, Cardizem and at current dose.  2. HTN -Stable and well controlled on current medications   3. Acute on chronic diastolic CHF - Advised low sodium diet. Leg elevation and compression stocking. Increased lasix to 40mg  BID x 3 days. Heart failure education given.   4. Recent GI bleed - Hgb stable at 10.2 on 11/6  Medication Adjustments/Labs and Tests Ordered: Current medicines are reviewed at length with the patient today.  Concerns regarding medicines are outlined above.  Medication changes, Labs and Tests ordered today are listed in the Patient Instructions below. Patient Instructions  Medication Instructions:  Your physician has recommended you make the following change in your medication:  1.  INCREASE the Lasix (Furosemide) to twice a day for 3 days then go back to 1 daily with the ok to take an extra tablet in the afternoons only as needed for weight gain of 3 lbs in a day or 5 lbs in 3-5 days  *If you need a refill on your cardiac medications before your next appointment, please call your pharmacy*  Lab Work: None ordered  If you have labs (blood work) drawn today and your tests are completely normal, you will receive your results only by: Marland Kitchen MyChart Message (if you have MyChart) OR . A paper copy in the mail If you have any lab test that is abnormal or we need to change your treatment, we will call you to review the results.  Testing/Procedures: None ordered  Follow-Up: At [facility], you and your health needs are our priority.  As part of our continuing mission to provide you with exceptional heart care, we have created designated Provider Care Teams.  These Care Teams include your primary Cardiologist (physician) and Advanced Practice Providers (APPs -  Physician Assistants and Nurse Practitioners) who  all work together to provide you with the care you need, when you need it.  Your next appointment:   6 months  The format for your next appointment:   Virtual Visit   Provider:   You may see Sherren Mocha, MD or one of the following Advanced Practice Providers on your designated Care Team:    Richardson Dopp, PA-C  Vin [place], [place]  Daune Perch, NP   Other Instructions      Signed, Leanor Kail, [place]  03/19/2019 9:59 AM    [address], [place], [place]  [address] Phone: [phone]; Fax: [phone]

## 2019-03-19 ENCOUNTER — Other Ambulatory Visit: Payer: Self-pay | Admitting: *Deleted

## 2019-03-19 ENCOUNTER — Ambulatory Visit (INDEPENDENT_AMBULATORY_CARE_PROVIDER_SITE_OTHER): Payer: Medicare Other | Admitting: Physician Assistant

## 2019-03-19 ENCOUNTER — Other Ambulatory Visit: Payer: Self-pay

## 2019-03-19 ENCOUNTER — Encounter: Payer: Self-pay | Admitting: Physician Assistant

## 2019-03-19 VITALS — BP 118/78 | HR 84 | Ht 72.0 in | Wt 250.6 lb

## 2019-03-19 DIAGNOSIS — D62 Acute posthemorrhagic anemia: Secondary | ICD-10-CM

## 2019-03-19 DIAGNOSIS — K2900 Acute gastritis without bleeding: Secondary | ICD-10-CM | POA: Diagnosis not present

## 2019-03-19 DIAGNOSIS — I480 Paroxysmal atrial fibrillation: Secondary | ICD-10-CM

## 2019-03-19 DIAGNOSIS — I10 Essential (primary) hypertension: Secondary | ICD-10-CM

## 2019-03-19 DIAGNOSIS — I5033 Acute on chronic diastolic (congestive) heart failure: Secondary | ICD-10-CM | POA: Diagnosis not present

## 2019-03-19 DIAGNOSIS — Z7189 Other specified counseling: Secondary | ICD-10-CM | POA: Diagnosis not present

## 2019-03-19 DIAGNOSIS — I130 Hypertensive heart and chronic kidney disease with heart failure and stage 1 through stage 4 chronic kidney disease, or unspecified chronic kidney disease: Secondary | ICD-10-CM | POA: Diagnosis not present

## 2019-03-19 DIAGNOSIS — K2100 Gastro-esophageal reflux disease with esophagitis, without bleeding: Secondary | ICD-10-CM | POA: Diagnosis not present

## 2019-03-19 DIAGNOSIS — N183 Chronic kidney disease, stage 3 unspecified: Secondary | ICD-10-CM | POA: Diagnosis not present

## 2019-03-19 MED ORDER — POTASSIUM CHLORIDE CRYS ER 20 MEQ PO TBCR: 40.0000 meq | EXTENDED_RELEASE_TABLET | Freq: Every day | ORAL | 3 refills | Status: DC

## 2019-03-19 MED ORDER — FUROSEMIDE 40 MG PO TABS: ORAL_TABLET | ORAL | 3 refills | Status: DC

## 2019-03-19 NOTE — Patient Outreach (Addendum)
[facility]) Care Management  03/19/2019  Voyd Groft 07/14/1940 [id number]   Referral received:03/18/2019 Transition of care call initiated: 03/19/2019 D/C SNF on 03/12/2019  RN spoke with pt today and introduced the [facility] program and services. Pt receptive with verified of identifiers however requested mail out information packet prior to proceeding with possible enrollment into the [facility] program. Pt aware and acknowledge his recent SNF discharge and current provider but again will await the packet via [facility] prior to proceeding. Pt very caution on fraud and does not wish to discuss enrollment services until information packet is received.   PLAN: RN will mail a successful outreach letter to pt with brochure and follow up next week as requested to further pursue possible enrollment. Will follow up accordingly and further engage at that time.  [name], RN Care Management Coordinator [place] Office [phone]

## 2019-03-19 NOTE — Patient Instructions (Signed)
Medication Instructions:  Your physician has recommended you make the following change in your medication:  1.  INCREASE the Lasix (Furosemide) to twice a day for 3 days then go back to 1 daily with the ok to take an extra tablet in the afternoons only as needed for weight gain of 3 lbs in a day or 5 lbs in 3-5 days  *If you need a refill on your cardiac medications before your next appointment, please call your pharmacy*  Lab Work: None ordered  If you have labs (blood work) drawn today and your tests are completely normal, you will receive your results only by: Marland Kitchen MyChart Message (if you have MyChart) OR . A paper copy in the mail If you have any lab test that is abnormal or we need to change your treatment, we will call you to review the results.  Testing/Procedures: None ordered  Follow-Up: At [facility], you and your health needs are our priority.  As part of our continuing mission to provide you with exceptional heart care, we have created designated Provider Care Teams.  These Care Teams include your primary Cardiologist (physician) and Advanced Practice Providers (APPs -  Physician Assistants and Nurse Practitioners) who all work together to provide you with the care you need, when you need it.  Your next appointment:   6 months  The format for your next appointment:   Virtual Visit   Provider:   You may see Sherren Mocha, MD or one of the following Advanced Practice Providers on your designated Care Team:    Richardson Dopp, PA-C  [place], [place]  Daune Perch, NP   Other Instructions

## 2019-03-21 DIAGNOSIS — K2900 Acute gastritis without bleeding: Secondary | ICD-10-CM | POA: Diagnosis not present

## 2019-03-21 DIAGNOSIS — K2100 Gastro-esophageal reflux disease with esophagitis, without bleeding: Secondary | ICD-10-CM | POA: Diagnosis not present

## 2019-03-21 DIAGNOSIS — N183 Chronic kidney disease, stage 3 unspecified: Secondary | ICD-10-CM | POA: Diagnosis not present

## 2019-03-21 DIAGNOSIS — I5033 Acute on chronic diastolic (congestive) heart failure: Secondary | ICD-10-CM | POA: Diagnosis not present

## 2019-03-21 DIAGNOSIS — I130 Hypertensive heart and chronic kidney disease with heart failure and stage 1 through stage 4 chronic kidney disease, or unspecified chronic kidney disease: Secondary | ICD-10-CM | POA: Diagnosis not present

## 2019-03-21 DIAGNOSIS — D62 Acute posthemorrhagic anemia: Secondary | ICD-10-CM | POA: Diagnosis not present

## 2019-03-26 DIAGNOSIS — K2100 Gastro-esophageal reflux disease with esophagitis, without bleeding: Secondary | ICD-10-CM | POA: Diagnosis not present

## 2019-03-26 DIAGNOSIS — N183 Chronic kidney disease, stage 3 unspecified: Secondary | ICD-10-CM | POA: Diagnosis not present

## 2019-03-26 DIAGNOSIS — I130 Hypertensive heart and chronic kidney disease with heart failure and stage 1 through stage 4 chronic kidney disease, or unspecified chronic kidney disease: Secondary | ICD-10-CM | POA: Diagnosis not present

## 2019-03-26 DIAGNOSIS — I5033 Acute on chronic diastolic (congestive) heart failure: Secondary | ICD-10-CM | POA: Diagnosis not present

## 2019-03-26 DIAGNOSIS — K2900 Acute gastritis without bleeding: Secondary | ICD-10-CM | POA: Diagnosis not present

## 2019-03-26 DIAGNOSIS — D62 Acute posthemorrhagic anemia: Secondary | ICD-10-CM | POA: Diagnosis not present

## 2019-03-27 ENCOUNTER — Other Ambulatory Visit: Payer: Self-pay | Admitting: *Deleted

## 2019-03-27 NOTE — Patient Outreach (Signed)
[facility]) Care Management  03/27/2019  Darryl Diaz 1940/12/26 [id number]  Transition of care (pending enrollment-Re: call back)  RN returned another call to pt pending enrollment after a request for information concerning [facility] services. RN reiterated on the available services and programs with pharmacy and social workers if needed for additional resources along with RN case management to assist with managing his ongoing care. Pt interested however indicated he was "in the middle of something" and requested a call back next week. RN offered to call back on Monday morning (receptive).   PLAN: RN will follow up on Monday as discussed pending enrollment into the [facility] program and services. No other inquires or request at this time.  [name], RN Care Management Coordinator [place] Office [phone]

## 2019-03-28 DIAGNOSIS — I130 Hypertensive heart and chronic kidney disease with heart failure and stage 1 through stage 4 chronic kidney disease, or unspecified chronic kidney disease: Secondary | ICD-10-CM | POA: Diagnosis not present

## 2019-03-28 DIAGNOSIS — K2100 Gastro-esophageal reflux disease with esophagitis, without bleeding: Secondary | ICD-10-CM | POA: Diagnosis not present

## 2019-03-28 DIAGNOSIS — I5033 Acute on chronic diastolic (congestive) heart failure: Secondary | ICD-10-CM | POA: Diagnosis not present

## 2019-03-28 DIAGNOSIS — N183 Chronic kidney disease, stage 3 unspecified: Secondary | ICD-10-CM | POA: Diagnosis not present

## 2019-03-28 DIAGNOSIS — D62 Acute posthemorrhagic anemia: Secondary | ICD-10-CM | POA: Diagnosis not present

## 2019-03-28 DIAGNOSIS — K2900 Acute gastritis without bleeding: Secondary | ICD-10-CM | POA: Diagnosis not present

## 2019-04-01 ENCOUNTER — Other Ambulatory Visit: Payer: Self-pay | Admitting: *Deleted

## 2019-04-01 DIAGNOSIS — D62 Acute posthemorrhagic anemia: Secondary | ICD-10-CM | POA: Diagnosis not present

## 2019-04-01 DIAGNOSIS — K2100 Gastro-esophageal reflux disease with esophagitis, without bleeding: Secondary | ICD-10-CM | POA: Diagnosis not present

## 2019-04-01 DIAGNOSIS — I5033 Acute on chronic diastolic (congestive) heart failure: Secondary | ICD-10-CM | POA: Diagnosis not present

## 2019-04-01 DIAGNOSIS — N183 Chronic kidney disease, stage 3 unspecified: Secondary | ICD-10-CM | POA: Diagnosis not present

## 2019-04-01 DIAGNOSIS — K2900 Acute gastritis without bleeding: Secondary | ICD-10-CM | POA: Diagnosis not present

## 2019-04-01 DIAGNOSIS — I130 Hypertensive heart and chronic kidney disease with heart failure and stage 1 through stage 4 chronic kidney disease, or unspecified chronic kidney disease: Secondary | ICD-10-CM | POA: Diagnosis not present

## 2019-04-01 NOTE — Patient Outreach (Signed)
[place] [facility]) Care Management  04/01/2019  Morgan Rennert [birth year] [id number]  Telephone Assessment Transition of Care-Unsuccessful  RN attempted outreach call however unsuccessful and RN unable to leave a message.   PLAN: RN will reschedule another outreach call over the next week.  [name], RN Care Management Coordinator [place] Office [phone]

## 2019-04-03 DIAGNOSIS — K2900 Acute gastritis without bleeding: Secondary | ICD-10-CM | POA: Diagnosis not present

## 2019-04-03 DIAGNOSIS — I5033 Acute on chronic diastolic (congestive) heart failure: Secondary | ICD-10-CM | POA: Diagnosis not present

## 2019-04-03 DIAGNOSIS — K2100 Gastro-esophageal reflux disease with esophagitis, without bleeding: Secondary | ICD-10-CM | POA: Diagnosis not present

## 2019-04-03 DIAGNOSIS — D62 Acute posthemorrhagic anemia: Secondary | ICD-10-CM | POA: Diagnosis not present

## 2019-04-03 DIAGNOSIS — I130 Hypertensive heart and chronic kidney disease with heart failure and stage 1 through stage 4 chronic kidney disease, or unspecified chronic kidney disease: Secondary | ICD-10-CM | POA: Diagnosis not present

## 2019-04-03 DIAGNOSIS — N183 Chronic kidney disease, stage 3 unspecified: Secondary | ICD-10-CM | POA: Diagnosis not present

## 2019-04-08 ENCOUNTER — Encounter: Payer: Self-pay | Admitting: *Deleted

## 2019-04-08 ENCOUNTER — Other Ambulatory Visit: Payer: Self-pay | Admitting: *Deleted

## 2019-04-08 NOTE — Patient Outreach (Signed)
[facility]) Care Management  04/08/2019  Darel Ricketts 05/16/1940 [id number]  Transition of care/Telephone Assessment  Based upon the information and brochures sent to pt he has decided to enroll into the [facility] program and services. Pt states he read the information and feels it is "legitimate". Pt discussed his medical issues related to previous falls related to low hemoglobin levels usually 11.6 down 6.5 as pt explained a leakage resulting in falls risk and history. Reports it is now 10.9 with no additional problems.  HF discussed as pt reports he is stable with his weights however does not weigh daily. Pt denies any swelling at this time with no related symptoms. RN stressed the importance of daily weights and why this is important related to fluid retention. RN further educated pt on HF zones and verified pt is in the GREEN zone. Will generated a plan of care with focus on HF and offer education information related to EMMI. Pt opt to decline emails and RN is unable to print education material due to website issues however will educate accordingly and send available information as pt indicates he does not use the computer anymore. Will continue to review the HF information with monthly updates on the plan of care generated today. Pt opt to decline weekly transition of care calls based upon his recent SNF discharge.  RN able to complete the initial assessment and strongly encourage pt on the to comply with the generated goals related to his HF. Pt receptive and very grateful for RN patience today during the call. Pt with a clear understanding of all goals with interventions provided in assisting pt in managing this condition. Will offer any additional tools and send pt a [facility] calendar when available for ongoing monitoring of his HF. Will notify pt's primary provider of his disposition with [facility] services and again discussed all goals and interventions. Again pt does not wish to receive  weekly transition of care calls but receptive to a monthly follow up. Will follow up next month and inquire on pt's ongoing management of care. No other inquires or request at this time.  Plan of care generated as noted.  [facility] CM Care Plan Problem One     Most Recent Value  Care Plan Problem One  Knowledge Deficit related to [place] plan  Role Documenting the Problem One  Care Management Telephonic Coordinator  Care Plan for Problem One  Active  [facility] Long Term Goal   Pt will verbalize the plan of action in the GREEN zone within the next 90 days.  [facility] Long Term Goal Start Date  04/08/19  Interventions for Problem One Long Term Goal  Will educate pt on the CHF action zones and verify pt is in the GREEN zone todya with no acute symtpoms. Will offer education emmi however pt opt to decline emialing this information and printable version is current not availbale. Will educate according.  [facility] CM Short Term Goal #1   Pt will verbalize maintaining weight within 2 pounds of goal within the next 30 days.  [facility] CM Short Term Goal #1 Start Date  04/08/19  Interventions for Short Term Goal #1  Will dicuss the importance of fuild retention and discussed the HF zones. Will verifiy pt aware to contact his provider with more then 3 lbs gained overnight or 5 lbs gained within one week with any signs or swelling  or related symptoms of HF.  [facility] CM Short Term Goal #2   Pt will adhere to  all medical appointments  [facility] CM Short Term Goal #2 Start Date  04/08/19  Interventions for Short Term Goal #2  Will stress the importance of attending all medical appointments to avoid acute events and/or related issues of HF. Will verify pt has sufficient transportation sources and offered additional resources if needed.      [name], RN Care Management Coordinator [place] Office [phone]

## 2019-04-09 DIAGNOSIS — I5033 Acute on chronic diastolic (congestive) heart failure: Secondary | ICD-10-CM | POA: Diagnosis not present

## 2019-04-09 DIAGNOSIS — K2100 Gastro-esophageal reflux disease with esophagitis, without bleeding: Secondary | ICD-10-CM | POA: Diagnosis not present

## 2019-04-09 DIAGNOSIS — K2900 Acute gastritis without bleeding: Secondary | ICD-10-CM | POA: Diagnosis not present

## 2019-04-09 DIAGNOSIS — D62 Acute posthemorrhagic anemia: Secondary | ICD-10-CM | POA: Diagnosis not present

## 2019-04-09 DIAGNOSIS — I130 Hypertensive heart and chronic kidney disease with heart failure and stage 1 through stage 4 chronic kidney disease, or unspecified chronic kidney disease: Secondary | ICD-10-CM | POA: Diagnosis not present

## 2019-04-09 DIAGNOSIS — N183 Chronic kidney disease, stage 3 unspecified: Secondary | ICD-10-CM | POA: Diagnosis not present

## 2019-04-12 DIAGNOSIS — M1990 Unspecified osteoarthritis, unspecified site: Secondary | ICD-10-CM | POA: Diagnosis not present

## 2019-04-12 DIAGNOSIS — E785 Hyperlipidemia, unspecified: Secondary | ICD-10-CM | POA: Diagnosis not present

## 2019-04-12 DIAGNOSIS — K2900 Acute gastritis without bleeding: Secondary | ICD-10-CM | POA: Diagnosis not present

## 2019-04-12 DIAGNOSIS — Z9181 History of falling: Secondary | ICD-10-CM | POA: Diagnosis not present

## 2019-04-12 DIAGNOSIS — Z87891 Personal history of nicotine dependence: Secondary | ICD-10-CM | POA: Diagnosis not present

## 2019-04-12 DIAGNOSIS — I480 Paroxysmal atrial fibrillation: Secondary | ICD-10-CM | POA: Diagnosis not present

## 2019-04-12 DIAGNOSIS — I5033 Acute on chronic diastolic (congestive) heart failure: Secondary | ICD-10-CM | POA: Diagnosis not present

## 2019-04-12 DIAGNOSIS — F329 Major depressive disorder, single episode, unspecified: Secondary | ICD-10-CM | POA: Diagnosis not present

## 2019-04-12 DIAGNOSIS — N183 Chronic kidney disease, stage 3 unspecified: Secondary | ICD-10-CM | POA: Diagnosis not present

## 2019-04-12 DIAGNOSIS — D62 Acute posthemorrhagic anemia: Secondary | ICD-10-CM | POA: Diagnosis not present

## 2019-04-12 DIAGNOSIS — I130 Hypertensive heart and chronic kidney disease with heart failure and stage 1 through stage 4 chronic kidney disease, or unspecified chronic kidney disease: Secondary | ICD-10-CM | POA: Diagnosis not present

## 2019-04-12 DIAGNOSIS — K2100 Gastro-esophageal reflux disease with esophagitis, without bleeding: Secondary | ICD-10-CM | POA: Diagnosis not present

## 2019-04-12 DIAGNOSIS — Z7901 Long term (current) use of anticoagulants: Secondary | ICD-10-CM | POA: Diagnosis not present

## 2019-04-12 DIAGNOSIS — R531 Weakness: Secondary | ICD-10-CM | POA: Diagnosis not present

## 2019-04-16 DIAGNOSIS — I130 Hypertensive heart and chronic kidney disease with heart failure and stage 1 through stage 4 chronic kidney disease, or unspecified chronic kidney disease: Secondary | ICD-10-CM | POA: Diagnosis not present

## 2019-04-16 DIAGNOSIS — K2900 Acute gastritis without bleeding: Secondary | ICD-10-CM | POA: Diagnosis not present

## 2019-04-16 DIAGNOSIS — D62 Acute posthemorrhagic anemia: Secondary | ICD-10-CM | POA: Diagnosis not present

## 2019-04-16 DIAGNOSIS — I5033 Acute on chronic diastolic (congestive) heart failure: Secondary | ICD-10-CM | POA: Diagnosis not present

## 2019-04-16 DIAGNOSIS — K2100 Gastro-esophageal reflux disease with esophagitis, without bleeding: Secondary | ICD-10-CM | POA: Diagnosis not present

## 2019-04-16 DIAGNOSIS — N183 Chronic kidney disease, stage 3 unspecified: Secondary | ICD-10-CM | POA: Diagnosis not present

## 2019-04-18 DIAGNOSIS — D62 Acute posthemorrhagic anemia: Secondary | ICD-10-CM | POA: Diagnosis not present

## 2019-04-18 DIAGNOSIS — K2900 Acute gastritis without bleeding: Secondary | ICD-10-CM | POA: Diagnosis not present

## 2019-04-18 DIAGNOSIS — K2100 Gastro-esophageal reflux disease with esophagitis, without bleeding: Secondary | ICD-10-CM | POA: Diagnosis not present

## 2019-04-18 DIAGNOSIS — I5033 Acute on chronic diastolic (congestive) heart failure: Secondary | ICD-10-CM | POA: Diagnosis not present

## 2019-04-18 DIAGNOSIS — N183 Chronic kidney disease, stage 3 unspecified: Secondary | ICD-10-CM | POA: Diagnosis not present

## 2019-04-18 DIAGNOSIS — I130 Hypertensive heart and chronic kidney disease with heart failure and stage 1 through stage 4 chronic kidney disease, or unspecified chronic kidney disease: Secondary | ICD-10-CM | POA: Diagnosis not present

## 2019-04-20 ENCOUNTER — Emergency Department (HOSPITAL_COMMUNITY): Payer: Medicare Other

## 2019-04-20 ENCOUNTER — Inpatient Hospital Stay (HOSPITAL_COMMUNITY)
Admission: EM | Admit: 2019-04-20 | Discharge: 2019-04-23 | DRG: 308 | Disposition: A | Discharge status: Home Health | Payer: Medicare Other | Attending: Internal Medicine | Admitting: Internal Medicine

## 2019-04-20 ENCOUNTER — Other Ambulatory Visit: Payer: Self-pay

## 2019-04-20 DIAGNOSIS — Z880 Allergy status to penicillin: Secondary | ICD-10-CM

## 2019-04-20 DIAGNOSIS — D649 Anemia, unspecified: Secondary | ICD-10-CM | POA: Diagnosis present

## 2019-04-20 DIAGNOSIS — N179 Acute kidney failure, unspecified: Secondary | ICD-10-CM | POA: Diagnosis not present

## 2019-04-20 DIAGNOSIS — I10 Essential (primary) hypertension: Secondary | ICD-10-CM | POA: Diagnosis not present

## 2019-04-20 DIAGNOSIS — R001 Bradycardia, unspecified: Principal | ICD-10-CM | POA: Diagnosis present

## 2019-04-20 DIAGNOSIS — Z20828 Contact with and (suspected) exposure to other viral communicable diseases: Secondary | ICD-10-CM | POA: Diagnosis not present

## 2019-04-20 DIAGNOSIS — Z79899 Other long term (current) drug therapy: Secondary | ICD-10-CM

## 2019-04-20 DIAGNOSIS — Z87891 Personal history of nicotine dependence: Secondary | ICD-10-CM | POA: Diagnosis not present

## 2019-04-20 DIAGNOSIS — G4700 Insomnia, unspecified: Secondary | ICD-10-CM | POA: Diagnosis present

## 2019-04-20 DIAGNOSIS — Z8249 Family history of ischemic heart disease and other diseases of the circulatory system: Secondary | ICD-10-CM

## 2019-04-20 DIAGNOSIS — K219 Gastro-esophageal reflux disease without esophagitis: Secondary | ICD-10-CM | POA: Diagnosis present

## 2019-04-20 DIAGNOSIS — I480 Paroxysmal atrial fibrillation: Secondary | ICD-10-CM | POA: Diagnosis present

## 2019-04-20 DIAGNOSIS — Z91048 Other nonmedicinal substance allergy status: Secondary | ICD-10-CM

## 2019-04-20 DIAGNOSIS — F329 Major depressive disorder, single episode, unspecified: Secondary | ICD-10-CM | POA: Diagnosis not present

## 2019-04-20 DIAGNOSIS — J9601 Acute respiratory failure with hypoxia: Secondary | ICD-10-CM | POA: Diagnosis present

## 2019-04-20 DIAGNOSIS — J9600 Acute respiratory failure, unspecified whether with hypoxia or hypercapnia: Secondary | ICD-10-CM

## 2019-04-20 DIAGNOSIS — Z86010 Personal history of colonic polyps: Secondary | ICD-10-CM

## 2019-04-20 DIAGNOSIS — N400 Enlarged prostate without lower urinary tract symptoms: Secondary | ICD-10-CM | POA: Diagnosis present

## 2019-04-20 DIAGNOSIS — I469 Cardiac arrest, cause unspecified: Secondary | ICD-10-CM | POA: Diagnosis not present

## 2019-04-20 DIAGNOSIS — E875 Hyperkalemia: Secondary | ICD-10-CM

## 2019-04-20 DIAGNOSIS — I959 Hypotension, unspecified: Secondary | ICD-10-CM | POA: Diagnosis present

## 2019-04-20 DIAGNOSIS — I482 Chronic atrial fibrillation, unspecified: Secondary | ICD-10-CM

## 2019-04-20 DIAGNOSIS — I462 Cardiac arrest due to underlying cardiac condition: Secondary | ICD-10-CM | POA: Diagnosis not present

## 2019-04-20 DIAGNOSIS — F1011 Alcohol abuse, in remission: Secondary | ICD-10-CM | POA: Diagnosis present

## 2019-04-20 DIAGNOSIS — E785 Hyperlipidemia, unspecified: Secondary | ICD-10-CM | POA: Diagnosis not present

## 2019-04-20 DIAGNOSIS — Y92009 Unspecified place in unspecified non-institutional (private) residence as the place of occurrence of the external cause: Secondary | ICD-10-CM | POA: Diagnosis not present

## 2019-04-20 DIAGNOSIS — R079 Chest pain, unspecified: Secondary | ICD-10-CM | POA: Diagnosis not present

## 2019-04-20 DIAGNOSIS — Z7901 Long term (current) use of anticoagulants: Secondary | ICD-10-CM | POA: Diagnosis not present

## 2019-04-20 DIAGNOSIS — W19XXXA Unspecified fall, initial encounter: Secondary | ICD-10-CM | POA: Diagnosis not present

## 2019-04-20 HISTORY — DX: Cardiac arrest, cause unspecified: I46.9

## 2019-04-20 LAB — POC SARS CORONAVIRUS 2 AG -  ED: SARS Coronavirus 2 Ag: NEGATIVE

## 2019-04-20 LAB — I-STAT CHEM 8, ED
BUN: 21 mg/dL (ref 8–23)
Calcium, Ion: 1.12 mmol/L — ABNORMAL LOW (ref 1.15–1.40)
Chloride: 108 mmol/L (ref 98–111)
Creatinine, Ser: 1.7 mg/dL — ABNORMAL HIGH (ref 0.61–1.24)
Glucose, Bld: 251 mg/dL — ABNORMAL HIGH (ref 70–99)
HCT: 31 % — ABNORMAL LOW (ref 39.0–52.0)
Hemoglobin: 10.5 g/dL — ABNORMAL LOW (ref 13.0–17.0)
Potassium: 5.8 mmol/L — ABNORMAL HIGH (ref 3.5–5.1)
Sodium: 134 mmol/L — ABNORMAL LOW (ref 135–145)
TCO2: 17 mmol/L — ABNORMAL LOW (ref 22–32)

## 2019-04-20 LAB — TROPONIN I (HIGH SENSITIVITY)
Troponin I (High Sensitivity): 6 ng/L (ref <18)
Troponin I (High Sensitivity): 9 ng/L (ref <18)
Troponin I (High Sensitivity): 10 ng/L (ref <18)

## 2019-04-20 LAB — CBC
HCT: 32.7 % — ABNORMAL LOW (ref 39.0–52.0)
Hemoglobin: 10.2 g/dL — ABNORMAL LOW (ref 13.0–17.0)
MCH: 33.6 pg (ref 26.0–34.0)
MCHC: 31.2 g/dL (ref 30.0–36.0)
MCV: 107.6 fL — ABNORMAL HIGH (ref 80.0–100.0)
Platelets: 245 10*3/uL (ref 150–400)
RBC: 3.04 MIL/uL — ABNORMAL LOW (ref 4.22–5.81)
RDW: 16.4 % — ABNORMAL HIGH (ref 11.5–15.5)
WBC: 9.3 10*3/uL (ref 4.0–10.5)
nRBC: 0 % (ref 0.0–0.2)

## 2019-04-20 LAB — BASIC METABOLIC PANEL
Anion gap: 13 (ref 5–15)
BUN: 19 mg/dL (ref 8–23)
CO2: 13 mmol/L — ABNORMAL LOW (ref 22–32)
Calcium: 8.5 mg/dL — ABNORMAL LOW (ref 8.9–10.3)
Chloride: 107 mmol/L (ref 98–111)
Creatinine, Ser: 1.89 mg/dL — ABNORMAL HIGH (ref 0.61–1.24)
GFR calc Af Amer: 39 mL/min — ABNORMAL LOW (ref >60)
GFR calc non Af Amer: 33 mL/min — ABNORMAL LOW (ref >60)
Glucose, Bld: 261 mg/dL — ABNORMAL HIGH (ref 70–99)
Potassium: 5.9 mmol/L — ABNORMAL HIGH (ref 3.5–5.1)
Sodium: 133 mmol/L — ABNORMAL LOW (ref 135–145)

## 2019-04-20 LAB — HEMOGLOBIN AND HEMATOCRIT, BLOOD
HCT: 34.4 % — ABNORMAL LOW (ref 39.0–52.0)
Hemoglobin: 11.3 g/dL — ABNORMAL LOW (ref 13.0–17.0)

## 2019-04-20 LAB — PROCALCITONIN: Procalcitonin: 0.12 ng/mL

## 2019-04-20 LAB — CORTISOL: Cortisol, Plasma: 39.1 ug/dL

## 2019-04-20 LAB — PROTIME-INR
INR: 1.6 — ABNORMAL HIGH (ref 0.8–1.2)
Prothrombin Time: 18.6 seconds — ABNORMAL HIGH (ref 11.4–15.2)

## 2019-04-20 LAB — GLUCOSE, CAPILLARY
Glucose-Capillary: 112 mg/dL — ABNORMAL HIGH (ref 70–99)
Glucose-Capillary: 140 mg/dL — ABNORMAL HIGH (ref 70–99)

## 2019-04-20 LAB — POC OCCULT BLOOD, ED: Fecal Occult Bld: POSITIVE — AB

## 2019-04-20 LAB — CBG MONITORING, ED: Glucose-Capillary: 266 mg/dL — ABNORMAL HIGH (ref 70–99)

## 2019-04-20 LAB — LACTIC ACID, PLASMA
Lactic Acid, Venous: 4.6 mmol/L (ref 0.5–1.9)
Lactic Acid, Venous: 4.3 mmol/L (ref 0.5–1.9)

## 2019-04-20 MED ORDER — SODIUM CHLORIDE 0.9 % IV SOLN
INTRAVENOUS | Status: DC
  Administered 2019-04-20: 21:00:00 via INTRAVENOUS

## 2019-04-20 MED ORDER — LEVALBUTEROL HCL 0.63 MG/3ML IN NEBU
0.6300 mg | INHALATION_SOLUTION | Freq: Four times a day (QID) | RESPIRATORY_TRACT | Status: DC | PRN
  Administered 2019-04-20 – 2019-04-21 (×2): 0.63 mg via RESPIRATORY_TRACT
  Filled 2019-04-20 (×2): qty 3

## 2019-04-20 MED ORDER — EPINEPHRINE HCL 5 MG/250ML IV SOLN IN NS
0.5000 ug/min | INTRAVENOUS | Status: DC
  Administered 2019-04-20: 20 ug/min via INTRAVENOUS
  Administered 2019-04-20: 13 ug/min via INTRAVENOUS
  Filled 2019-04-20: qty 250

## 2019-04-20 MED ORDER — ATROPINE SULFATE 1 MG/ML IJ SOLN
1.0000 mg | Freq: Once | INTRAMUSCULAR | Status: AC
  Administered 2019-04-20: 1 mg via INTRAVENOUS

## 2019-04-20 MED ORDER — ONDANSETRON HCL 4 MG/2ML IJ SOLN: 4.0000 mg | Freq: Four times a day (QID) | INTRAMUSCULAR | Status: DC | PRN

## 2019-04-20 MED ORDER — SODIUM CHLORIDE 0.9 % IV BOLUS
1000.0000 mL | Freq: Once | INTRAVENOUS | Status: AC
  Administered 2019-04-20: 1000 mL via INTRAVENOUS

## 2019-04-20 MED ORDER — ACETAMINOPHEN 325 MG PO TABS
650.0000 mg | ORAL_TABLET | ORAL | Status: DC | PRN
  Administered 2019-04-23: 05:00:00 650 mg via ORAL
  Filled 2019-04-20: qty 2

## 2019-04-20 MED ORDER — INSULIN ASPART 100 UNIT/ML ~~LOC~~ SOLN
8.0000 [IU] | Freq: Once | SUBCUTANEOUS | Status: AC
  Administered 2019-04-20: 8 [IU] via INTRAVENOUS

## 2019-04-20 MED ORDER — SODIUM CHLORIDE 0.9 % IV SOLN
1.0000 mg | Freq: Once | INTRAVENOUS | Status: AC
  Administered 2019-04-20: 1 mg via INTRAVENOUS
  Filled 2019-04-20: qty 0.2

## 2019-04-20 MED ORDER — INSULIN ASPART 100 UNIT/ML ~~LOC~~ SOLN
0.0000 [IU] | SUBCUTANEOUS | Status: DC
  Administered 2019-04-20 – 2019-04-23 (×5): 1 [IU] via SUBCUTANEOUS
  Administered 2019-04-23: 2 [IU] via SUBCUTANEOUS
  Administered 2019-04-23: 1 [IU] via SUBCUTANEOUS

## 2019-04-20 MED ORDER — SODIUM BICARBONATE 8.4 % IV SOLN
50.0000 meq | Freq: Once | INTRAVENOUS | Status: AC
  Administered 2019-04-20: 50 meq via INTRAVENOUS
  Filled 2019-04-20: qty 50

## 2019-04-20 MED ORDER — DEXTROSE 50 % IV SOLN
25.0000 g | Freq: Once | INTRAVENOUS | Status: AC
  Administered 2019-04-20: 25 g via INTRAVENOUS
  Filled 2019-04-20: qty 50

## 2019-04-20 MED ORDER — SODIUM CHLORIDE 0.9 % IV SOLN: INTRAVENOUS | Status: AC

## 2019-04-20 MED ORDER — SODIUM CHLORIDE 0.9 % IV SOLN: 250.0000 mL | INTRAVENOUS | Status: DC

## 2019-04-20 MED ORDER — CALCIUM GLUCONATE 10 % IV SOLN
1.0000 g | Freq: Once | INTRAVENOUS | Status: AC
  Administered 2019-04-20: 1 g via INTRAVENOUS
  Filled 2019-04-20: qty 10

## 2019-04-20 MED ORDER — THIAMINE HCL 100 MG/ML IJ SOLN
100.0000 mg | Freq: Every day | INTRAMUSCULAR | Status: DC
  Administered 2019-04-20 – 2019-04-23 (×4): 100 mg via INTRAVENOUS
  Filled 2019-04-20 (×4): qty 2

## 2019-04-20 MED ORDER — FOLIC ACID 5 MG/ML IJ SOLN: 1.0000 mg | Freq: Every day | INTRAMUSCULAR | Status: DC

## 2019-04-20 NOTE — ED Notes (Signed)
Epi decreased to 15 per Dr Ashok Cordia

## 2019-04-20 NOTE — ED Triage Notes (Signed)
Per [facility], they were called out for a fall that pt sustained this morning. Upon arrival pt had HR of 20 a-fib, after they got the pt in the truck the pt had cardiac arrest, got 12 minutes of CPR and 3 epis and ROSC achieved. Upon arrival here pt is alert and talking. HR was in the 40s a-fib post arrest. Pt was on epi drip upon arrival here. Pt is very pale. Last BP 130/98

## 2019-04-20 NOTE — ED Notes (Signed)
Pt now ao x 4.  NRB removed and pt placed on 4 L .

## 2019-04-20 NOTE — ED Notes (Signed)
1st unit of O Pos RBCs started through the [facility].

## 2019-04-20 NOTE — H&P (Addendum)
NAME:  Darryl Diaz, MRN:  [id number], DOB:  19-Apr-1941, LOS: 0 ADMISSION DATE:  04/20/2019, CONSULTATION DATE:  04/20/19 REFERRING MD:  Dr. Roslynn Amble, CHIEF COMPLAINT:  Cardiac arrest    Brief History   78 y/o M who presented to [facility] on 12/12 after reported fall.  Found to have HR in 20's by EMS.  Reported cardiac arrest in truck with 12 minutes of CPR s/p 3 epi before ROSC.  In ER, patient alert/talking with HR in the 40's post arrest.  Treated in ER with 2 units PRBC and EPI gtt.  Initial Hgb 10.2.   History of present illness   78 y/o M who presented to [facility] on 12/12 after reported fall.   The patient was hospitalized in October 2020 for falls, weakness in the setting of anemia with FOBT positive. 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 Hospital course complicated by [facility].  He was restarted on Eliquis during that admit without further bleeding. Discharge Hgb 10/27 was 10.2. He lives at home independently and is able to prepare meals / perform ADL's.  Reports he has been feeling tired / weak for one month. "Just haven't felt myself".    The patient called EMS on 12/12 for a fall at home.  On EMS arrival, he reportedly had a HR in the 20's.  During transport he suffered a reported cardiac arrest in truck with 12 minutes of CPR s/p 3 epi before ROSC. Normal CBG. On arrival to the ER the patient was alert/talking with HR in the 40's post arrest.  He was on an EPI gtt on arrival to ER with HR in the 40's.  He was treated with 2 units of emergency release blood given history of GIB and being quite pale.  His alertness improved with atropine.  Labs notable for WBC 9.3, Hgb 10.2, MCV 107.6, platelets 245, Na 133, K 5.9, Cl 107, CO2 13, glucose 261, Cr 1.89, HS-troponin 6.  EKG (upon my review), possible junctional / brady. FOBT positive. Temp 95.5.  PCCM called for ICU admit.   Past Medical History  Depression HTN HLD  GERD  AF - on Eliquis, toprol XL, cardizem, amiodarone  ETOH  [facility]   12/12 Admit post fall, HR in 20's with EMS, 12 min CPR  Consults:    Procedures:    Significant Diagnostic Tests:  EKG 12/12 >>  ECHO 12/13 >>  Micro Data:  COVID 12/12 >> POC negative    Antimicrobials:    Interim history/subjective:  As above.  RN reports pt on 61mcg's epi.   Objective   Blood pressure 105/68, pulse (!) 57, temperature (!) 95.5 F (35.3 C), temperature source Temporal, resp. rate 17, SpO2 96 %.       No intake or output data in the 24 hours ending 04/20/19 1749 There were no vitals filed for this visit.  Examination: General: pale elderly male lying in bed in NAD  HEENT: MM pink/dry, no jvd, pupils =/reactive  Neuro: Awake, alert, oriented, MAE  CV: s1s2 regular, brady in 40's-50's, no m/r/g PULM:  Non-labored in 4L [place] O2, lungs bilaterally clear GI: soft, bsx4 active  Extremities: warm/dry, 2+ BLE pitting edema  Skin: no rashes or lesions  Resolved Hospital Problem list     Assessment & Plan:   Bradycardic Arrest  Hypotension  Bradycardia Question if patient had true arrest or altered LOC with bradycardia and poor perfusion. Receive 12 minutes of CPR with EPI.  Possible component of volume depletion as despite LE swelling he appears dry on exam. Consider also medication mix-up with accidental overdose.  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 FOBT positive on admit.  -follow up Hgb at 2000 -transfuse if Hgb <7% or evidence of active bleeding  -PPI IV  HTN HLD  AF -hold home  cardizem, toprol, eliquis  -ICU monitoring   Hx Pan-Gastritis  Biopsy & H.Pylori negative on 02/28/2019 -hold home eliquis  -clear liquid diet for now -may need GI evaluation in am   Former ETOH Abuse  Depression  Insomnia  -monitor for evidence of withdrawal.  Reports no use in last 2 months -hold trazodone, lexapro  BPH -hold home flomax  Best practice:  Diet: clear liquids  Pain/Anxiety/Delirium protocol (if indicated): n/a VAP protocol (if indicated): n/a DVT prophylaxis: SCD's  GI prophylaxis: Protonix  Glucose control:  Mobility: BR  Code Status: Full Code Family Communication: Patient updated on plan of care Disposition: ICU  Labs   CBC: Recent Labs  Lab 04/20/19 1416 04/20/19 1425  WBC 9.3  --   HGB 10.2* 10.5*  HCT 32.7* 31.0*  MCV 107.6*  --   PLT 245  --     Basic Metabolic Panel: Recent Labs  Lab 04/20/19 1416 04/20/19 1425  NA 133* 134*  K 5.9* 5.8*  CL 107 108  CO2 13*  --   GLUCOSE 261* 251*  BUN 19 21  CREATININE 1.89* 1.70*  CALCIUM 8.5*  --    GFR: CrCl cannot be calculated (Unknown ideal weight.). Recent Labs  Lab 04/20/19 1416  WBC 9.3    Liver Function Tests: No results for input(s): AST, ALT, ALKPHOS, BILITOT, PROT, ALBUMIN in the last 168 hours. No results for input(s): LIPASE, AMYLASE in the last 168 hours. No results for input(s): AMMONIA in the last 168 hours.  ABG    Component Value Date/Time   TCO2 17 (L) 04/20/2019 1425     Coagulation Profile: Recent Labs  Lab 04/20/19 1416  INR 1.6*    Cardiac Enzymes: No results for input(s): CKTOTAL, CKMB, CKMBINDEX, TROPONINI in the last 168 hours.  HbA1C: Hgb A1c MFr Bld  Date/Time Value Ref Range Status  02/28/2019 11:00 AM 4.7 (L) 4.8 - 5.6 % Final    Comment:    (NOTE) Pre diabetes:          5.7%-6.4% Diabetes:              >6.4% Glycemic control for   <7.0% adults with diabetes     CBG: Recent Labs  Lab 04/20/19 1423  GLUCAP 266*    Review  of Systems: Positives in [place]  Gen: Denies fever, chills, weight change, fatigue, night sweats HEENT: Denies blurred vision, double vision, hearing loss, tinnitus, sinus congestion, rhinorrhea, sore throat, neck stiffness, dysphagia PULM: Denies shortness of breath, cough, sputum production, hemoptysis, wheezing CV: Denies chest pain, edema, orthopnea, paroxysmal nocturnal dyspnea, palpitations GI: Denies abdominal pain, nausea, vomiting, diarrhea, hematochezia, melena, constipation, change in bowel habits GU: Denies dysuria, hematuria, polyuria, oliguria, urethral discharge  Endocrine: Denies hot or cold intolerance, polyuria, polyphagia or appetite change Derm: Denies rash, dry skin, scaling or peeling skin change Heme: Denies easy bruising, bleeding, bleeding gums Neuro: Denies headache, numbness, generalized weakness, slurred speech, loss of memory or consciousness   Past Medical History  He,  has a past medical history of Alcoholism ([place]), Atrial fibrillation ([place]), Fall at home (03/28/2016), GERD (gastroesophageal reflux disease), H/O seasonal allergies, adenomatous colonic polyps (08/10/2017), Hyperlipidemia, Hypertension, Insomnia, Major depressive disorder, and OA (osteoarthritis).   Surgical History    Past Surgical History:  Procedure Laterality Date  . ANKLE SURGERY Left 2014   had rods put in   . BIOPSY  02/28/2019   Procedure: BIOPSY;  Surgeon: Milus Banister, MD;  Location: [facility] ENDOSCOPY;  Service: Endoscopy;;  . [place]  . ESOPHAGOGASTRODUODENOSCOPY (EGD) WITH PROPOFOL N/A 02/28/2019   Procedure: ESOPHAGOGASTRODUODENOSCOPY (EGD) WITH PROPOFOL;  Surgeon: Milus Banister, MD;  Location: [facility] ENDOSCOPY;  Service: Endoscopy;  Laterality: N/A;  . HIP ARTHROPLASTY Left 04/01/2016   Procedure: ARTHROPLASTY BIPOLAR HIP (HEMIARTHROPLASTY);  Surgeon: Paralee Cancel, MD;  Location: [place];  Service: Orthopedics;  Laterality: Left;  . HIP CLOSED REDUCTION Left  04/30/2016   Procedure: CLOSED REDUCTION HIP;  Surgeon: Wylene Simmer, MD;  Location: [place];  Service: Orthopedics;  Laterality: Left;  . TONSILLECTOMY       Social History   reports that he has quit smoking. He has never used smokeless tobacco. He reports that he does not drink alcohol or use drugs.   Family History   His family history includes Arthritis in his mother; Heart disease (age of onset: 86) in his mother; Heart failure in his sister; Heart failure (age of onset: 33) in his father; Hypertension in his mother; Stroke in his maternal aunt. There is no history of Colon cancer, Esophageal cancer, Stomach cancer, or Rectal cancer.   Allergies Allergies  Allergen Reactions  . Penicillins Other (See Comments)    Did it involve swelling of the face/tongue/throat, SOB, or low BP? Unknown Did it involve sudden or severe rash/hives, skin peeling, or any reaction on the inside of your mouth or nose? Unknown Did you need to seek medical attention at a hospital or doctor's office? Unknown When did it last happen?childhood reaction If all above answers are "NO", may proceed with cephalosporin use.     Home Medications  Prior to Admission medications   Medication Sig Start Date End Date Taking? Authorizing Provider  acetaminophen (TYLENOL) 500 MG tablet Take 1,000 mg by mouth every 6 (six) hours as needed for headache (pain).    [provider]  amiodarone (PACERONE) 200 MG tablet Take 1 tablet (200 mg total) by mouth daily. 03/05/19   Mercy Riding, MD  apixaban (ELIQUIS) 5 MG TABS tablet Take 1 tablet (5 mg total) by mouth 2 (two) times daily. 08/24/18   Geradine Girt, DO  atorvastatin (LIPITOR) 20 MG tablet Take 20 mg by mouth daily.    [provider]  diltiazem (CARDIZEM CD) 360 MG 24 hr capsule Take 1 capsule (360 mg total) by mouth daily. 03/05/19   Mercy Riding, MD  escitalopram (LEXAPRO) 10 MG tablet Take 10 mg by mouth daily.    [provider]   ferrous sulfate 325 (65 FE) MG EC tablet Take 325 mg by mouth daily.     [provider]  folic acid (FOLVITE) 1 MG tablet Take 1 mg by mouth daily.  [provider]  furosemide (LASIX) 40 MG tablet Take 1 tablet by mouth twice a day for 3 days then go back to 1 tablet by mouth daily with the ok to take 1 tablet by mouth in the afternoon as needed for weight gain of 3 lbs in 1 day or 5 lbs in 3-5 days 03/19/19   Bhagat, Bhavinkumar, PA  loratadine (CLARITIN) 10 MG tablet Take 10 mg by mouth daily.    [provider]  metoprolol (TOPROL-XL) 200 MG 24 hr tablet Take 200 mg by mouth daily.    [provider]  miconazole nitrate (MICATIN) POWD Apply 1 application topically 3 (three) times daily. 03/05/19   Mercy Riding, MD  Multiple Vitamin (MULTIVITAMIN WITH MINERALS) TABS tablet Take 1 tablet by mouth daily.    [provider]  pantoprazole (PROTONIX) 40 MG tablet Take 1 tablet (40 mg total) by mouth 2 (two) times daily before a meal. 03/05/19   Gonfa, Charlesetta Ivory, MD  potassium chloride SA (KLOR-CON) 20 MEQ tablet Take 2 tablets (40 mEq total) by mouth daily. 03/19/19   Bhagat, Crista Luria, PA  silver sulfADIAZINE (SILVADENE) 1 % cream Apply topically 2 (two) times daily. 03/05/19   Mercy Riding, MD  tamsulosin (FLOMAX) 0.4 MG CAPS capsule Take 0.4 mg by mouth.    [provider]  Thiamine HCl (VITAMIN B-1) 250 MG tablet Take 250 mg by mouth daily.    [provider]  traZODone (DESYREL) 50 MG tablet Take 50 mg by mouth at bedtime.    [provider]     Critical care time:     Noe Gens, MSN, NP-C [facility] 04/20/2019, 5:49 PM   Please see [email or web address] for pager details.

## 2019-04-20 NOTE — ED Provider Notes (Signed)
[facility] EMERGENCY DEPARTMENT Provider Note   CSN: [id number] Arrival date & time: 04/20/19  1410     History Chief Complaint  Patient presents with  . Cardiac Arrest    Darryl Diaz is a 78 y.o. male.  Patient with hx afib, hx gib, presents via EMS after period of cpr. EMS indicates called as patient very weak, faint. They found patient in afib, bradycardic. They indicate shortly after patient arrested, with loc, loss of pulses. They have 3 rounds epi, cpr, and had return of pulses and consciousness. No report of chest pain. EMS notes patient very pale. cbg was not low. Patient unable to provide additional history/?confused s/p arrest period - level 5 caveat.  EMS notes after ROSC patient remains pale, generally weak, bp low - placed on epi gtt.   The history is provided by the patient and the EMS personnel. The history is limited by the condition of the patient.  Cardiac Arrest Associated symptoms: no chest pain        Past Medical History:  Diagnosis Date  . 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 Atrial fibrillation ([place])   . Fall at home 03/28/2016  . GERD (gastroesophageal reflux disease)   . H/O seasonal allergies   . Hx of adenomatous colonic polyps 08/10/2017  . Hyperlipidemia   . Hypertension   . Insomnia   . 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 OA (osteoarthritis)    hands    Patient Active Problem List   Diagnosis Date Noted  . Atrial fibrillation with rapid ventricular response ([place])   . GI bleed 02/25/2019  . Falls 02/25/2019  . Bilateral leg edema 02/25/2019  . Hyponatremia 02/25/2019  . Acute posthemorrhagic anemia 08/11/2018  . Symptomatic anemia 08/11/2018  . Hx of adenomatous colonic polyps 08/10/2017  . PAF (paroxysmal atrial fibrillation) ([place]) 02/05/2017  . Dislocation of hip, posterior, left, closed ([place]) 04/30/2016  . Hip fracture ([place]) 03/30/2016  . Hypertension 03/30/2016  .  Hyperlipidemia 03/30/2016  . GERD (gastroesophageal reflux disease) 03/30/2016  . Osteoarthritis 03/30/2016  . Depression 03/30/2016  . Rhabdomyolysis 03/30/2016  . Leukocytosis 03/30/2016  . Pressure injury of skin 03/30/2016  . Closed fracture of left hip [facility])     Past Surgical History:  Procedure Laterality Date  . ANKLE SURGERY Left 2014   had rods put in   . BIOPSY  02/28/2019   Procedure: BIOPSY;  Surgeon: Milus Banister, MD;  Location: [facility] ENDOSCOPY;  Service: Endoscopy;;  . [place]  . ESOPHAGOGASTRODUODENOSCOPY (EGD) WITH PROPOFOL N/A 02/28/2019   Procedure: ESOPHAGOGASTRODUODENOSCOPY (EGD) WITH PROPOFOL;  Surgeon: Milus Banister, MD;  Location[facility] ENDOSCOPY;  Service: Endoscopy;  Laterality: N/A;  . HIP ARTHROPLASTY Left 04/01/2016   Procedure: ARTHROPLASTY BIPOLAR HIP (HEMIARTHROPLASTY);  Surgeon: Paralee Cancel, MD;  Location: [place];  Service: Orthopedics;  Laterality: Left;  . HIP CLOSED REDUCTION Left 04/30/2016   Procedure: CLOSED REDUCTION HIP;  Surgeon: Wylene Simmer, MD;  Location: [place];  Service: Orthopedics;  Laterality: Left;  . TONSILLECTOMY         Family History  Problem Relation Age of Onset  . Heart disease Mother 80  . Hypertension Mother   . Arthritis Mother   . Heart failure Father 72  . Stroke Maternal Aunt   . Heart failure Sister   . Colon cancer Neg Hx   . Esophageal cancer Neg Hx   .  Stomach cancer Neg Hx   . Rectal cancer Neg Hx     Social History   Tobacco Use  . Smoking status: Former Research scientist (life sciences)  . Smokeless tobacco: Never Used  Substance Use Topics  . Alcohol use: No    Comment: 03/2016   i HAD ONE DRINK & THAT WAS THE FIRST DRINK i HAD IN A LONG TIME   . Drug use: No    Home Medications Prior to Admission medications   Medication Sig Start Date End Date Taking? Authorizing Provider  acetaminophen (TYLENOL) 500 MG tablet Take 1,000 mg by mouth every 6 (six) hours as needed for headache (pain).    [provider]  amiodarone (PACERONE) 200 MG tablet Take 1 tablet (200 mg total) by mouth daily. 03/05/19   Mercy Riding, MD  apixaban (ELIQUIS) 5 MG TABS tablet Take 1 tablet (5 mg total) by mouth 2 (two) times daily. 08/24/18   Geradine Girt, DO  atorvastatin (LIPITOR) 20 MG tablet Take 20 mg by mouth daily.    [provider]  diltiazem (CARDIZEM CD) 360 MG 24 hr capsule Take 1 capsule (360 mg total) by mouth daily. 03/05/19   Mercy Riding, MD  escitalopram (LEXAPRO) 10 MG tablet Take 10 mg by mouth daily.    [provider]  ferrous sulfate 325 (65 FE) MG EC tablet Take 325 mg by mouth daily.     [provider]  folic acid (FOLVITE) 1 MG tablet Take 1 mg by mouth daily.    [provider]  furosemide (LASIX) 40 MG tablet Take 1 tablet by mouth twice a day for 3 days then go back to 1 tablet by mouth daily with the ok to take 1 tablet by mouth in the afternoon as needed for weight gain of 3 lbs in 1 day or 5 lbs in 3-5 days 03/19/19   Bhagat, Bhavinkumar, PA  loratadine (CLARITIN) 10 MG tablet Take 10 mg by mouth daily.    [provider]  metoprolol (TOPROL-XL) 200 MG 24 hr tablet Take 200 mg by mouth daily.    [provider]  miconazole nitrate (MICATIN) POWD Apply 1 application topically 3 (three) times daily. 03/05/19   Mercy Riding, MD  Multiple Vitamin (MULTIVITAMIN WITH MINERALS) TABS tablet Take 1 tablet by mouth daily.    [provider]  pantoprazole (PROTONIX) 40 MG tablet Take 1 tablet (40 mg total) by mouth 2 (two) times daily before a meal. 03/05/19   Gonfa, Charlesetta Ivory, MD  potassium chloride SA (KLOR-CON) 20 MEQ tablet Take 2 tablets (40 mEq total) by mouth daily. 03/19/19   Bhagat, Crista Luria, PA  silver sulfADIAZINE (SILVADENE) 1 % cream Apply topically 2 (two) times daily. 03/05/19   Mercy Riding, MD  tamsulosin (FLOMAX) 0.4 MG CAPS capsule Take 0.4 mg by mouth.    [provider]  Thiamine HCl (VITAMIN  B-1) 250 MG tablet Take 250 mg by mouth daily.    [provider]  traZODone (DESYREL) 50 MG tablet Take 50 mg by mouth at bedtime.    [provider]    Allergies    Penicillins  Review of Systems   Review of Systems  Unable to perform ROS: Mental status change  Constitutional: Negative for fever.  Cardiovascular: Negative for chest pain.  Neurological: Positive for syncope. Negative for headaches.  level 5 caveat - s/p cpr, confusion.     Physical Exam Updated Vital Signs BP 116/62  Pulse 61   Resp (!) 30   SpO2 96%   Physical Exam Vitals and nursing note reviewed.  Constitutional:      Appearance: He is well-developed.     Comments: Patient awake, eyes open. Very pale and weak appearing. Lucas device in place.   HENT:     Head: Atraumatic.     Nose: Nose normal.     Mouth/Throat:     Mouth: Mucous membranes are moist.     Pharynx: Oropharynx is clear.  Eyes:     General: No scleral icterus.    Pupils: Pupils are equal, round, and reactive to light.     Comments: Conj pale.   Neck:     Vascular: No carotid bruit.     Trachea: No tracheal deviation.  Cardiovascular:     Rate and Rhythm: Normal rate. Rhythm irregular.     Heart sounds: Normal heart sounds. No murmur. No friction rub. No gallop.   Pulmonary:     Effort: Pulmonary effort is normal. No accessory muscle usage or respiratory distress.     Breath sounds: Normal breath sounds.  Abdominal:     General: Bowel sounds are normal. There is no distension.     Palpations: Abdomen is soft.     Tenderness: There is no abdominal tenderness. There is no guarding.     Comments: Obese.   Genitourinary:    Comments: No cva tenderness. Reddish brown, very loose stool - strongly heme pos.  Musculoskeletal:        General: No swelling or tenderness.     Cervical back: Normal range of motion and neck supple. No rigidity.  Skin:    General: Skin is warm and dry.     Coloration: Skin is pale.      Findings: No rash.  Neurological:     Comments: Awake, eyes open. Responds to simple questions, briefly verbalizes, confused/slow to respond.   Psychiatric:     Comments: Alert.      ED Results / Procedures / Treatments   Labs (all labs ordered are listed, but only abnormal results are displayed) Results for orders placed or performed during the hospital encounter of 47/42/59  Basic metabolic panel  Result Value Ref Range   Sodium 133 (L) 135 - 145 mmol/L   Potassium 5.9 (H) 3.5 - 5.1 mmol/L   Chloride 107 98 - 111 mmol/L   CO2 13 (L) 22 - 32 mmol/L   Glucose, Bld 261 (H) 70 - 99 mg/dL   BUN 19 8 - 23 mg/dL   Creatinine, Ser 1.89 (H) 0.61 - 1.24 mg/dL   Calcium 8.5 (L) 8.9 - 10.3 mg/dL   GFR calc non Af Amer 33 (L) >60 mL/min   GFR calc Af Amer 39 (L) >60 mL/min   Anion gap 13 5 - 15  CBC  Result Value Ref Range   WBC 9.3 4.0 - 10.5 K/uL   RBC 3.04 (L) 4.22 - 5.81 MIL/uL   Hemoglobin 10.2 (L) 13.0 - 17.0 g/dL   HCT 32.7 (L) 39.0 - 52.0 %   MCV 107.6 (H) 80.0 - 100.0 fL   MCH 33.6 26.0 - 34.0 pg   MCHC 31.2 30.0 - 36.0 g/dL   RDW 16.4 (H) 11.5 - 15.5 %   Platelets 245 150 - 400 K/uL   nRBC 0.0 0.0 - 0.2 %  PT/INR  Result Value Ref Range   Prothrombin Time 18.6 (H) 11.4 - 15.2 seconds   INR 1.6 (H) 0.8 -  1.2  I-stat chem 8, ED (not at [facility] or [facility])  Result Value Ref Range   Sodium 134 (L) 135 - 145 mmol/L   Potassium 5.8 (H) 3.5 - 5.1 mmol/L   Chloride 108 98 - 111 mmol/L   BUN 21 8 - 23 mg/dL   Creatinine, Ser 1.70 (H) 0.61 - 1.24 mg/dL   Glucose, Bld 251 (H) 70 - 99 mg/dL   Calcium, Ion 1.12 (L) 1.15 - 1.40 mmol/L   TCO2 17 (L) 22 - 32 mmol/L   Hemoglobin 10.5 (L) 13.0 - 17.0 g/dL   HCT 31.0 (L) 39.0 - 52.0 %  POC occult blood, ED Provider will collect  Result Value Ref Range   Fecal Occult Bld POSITIVE (A) NEGATIVE  CBG monitoring, ED  Result Value Ref Range   Glucose-Capillary 266 (H) 70 - 99 mg/dL  POC SARS Coronavirus 2 Ag-ED -  Result Value Ref Range    SARS Coronavirus 2 Ag NEGATIVE NEGATIVE  Type and screen  Result Value Ref Range   ABO/RH(D) O POS    Antibody Screen NEG    Sample Expiration      04/23/2019,2359 Performed at [facility], [address] [place], [place] [address]    Unit Number [id number]    Blood Component Type RED CELLS,LR    Unit division 00    Status of Unit ISSUED    Transfusion Status OK TO TRANSFUSE    Crossmatch Result COMPATIBLE    Unit tag comment VERBAL ORDERS PER DR [place]    Unit Number [id number]    Blood Component Type RED CELLS,LR    Unit division 00    Status of Unit ISSUED    Transfusion Status OK TO TRANSFUSE    Crossmatch Result COMPATIBLE    Unit tag comment VERBAL ORDERS PER DR Dorman [place]   BPAM RBC  Result Value Ref Range   ISSUE DATE / TIME 017494496759    Blood Product Unit Number [id number]    Unit Type and Rh 5100    Blood Product Expiration Date 202101022359    ISSUE DATE / TIME 017793903009    Blood Product Unit Number [id number]    Unit Type and Rh 5100    Blood Product Expiration Date 545625638937   Troponin I (High Sensitivity)  Result Value Ref Range   Troponin I (High Sensitivity) 6 <18 ng/L   DG Chest Portable 1 View  Result Date: 04/20/2019 CLINICAL DATA:  Fall. EXAM: PORTABLE CHEST 1 VIEW COMPARISON:  February 25, 2019. FINDINGS: Mild cardiomegaly is noted. Minimal bibasilar subsegmental atelectasis is noted. No pneumothorax or pleural effusion is noted. The visualized skeletal structures are unremarkable. IMPRESSION: Minimal bibasilar subsegmental atelectasis. Electronically Signed   By: Marijo Conception M.D.   On: 04/20/2019 14:35    EKG EKG Interpretation  Date/Time:  Saturday April 20 2019 14:16:18 EST Ventricular Rate:  31 PR Interval:    QRS Duration: 136 QT Interval:  548 QTC Calculation: 394 R Axis:   9 Text Interpretation: Sinus bradycardia Atrial premature complexes IVCD, consider atypical RBBB Anteroseptal infarct, age  indeterminate Confirmed by Madalyn Rob ([id number]) on 04/20/2019 3:02:12 PM   Radiology DG Chest Portable 1 View  Result Date: 04/20/2019 CLINICAL DATA:  Fall. EXAM: PORTABLE CHEST 1 VIEW COMPARISON:  February 25, 2019. FINDINGS: Mild cardiomegaly is noted. Minimal bibasilar subsegmental atelectasis is noted. No pneumothorax or pleural effusion is noted. The visualized skeletal structures are unremarkable. IMPRESSION: Minimal bibasilar subsegmental atelectasis. Electronically Signed   By:  Marijo Conception M.D.   On: 04/20/2019 14:35    Procedures Procedures (including critical care time)  Medications Ordered in ED Medications  atropine injection 1 mg (1 mg Intravenous Given 04/20/19 1424)  calcium gluconate inj 10% (1 g) URGENT USE ONLY! (1 g Intravenous Given 04/20/19 1442)  sodium bicarbonate injection 50 mEq (50 mEq Intravenous Given 04/20/19 1437)  dextrose 50 % solution 25 g (25 g Intravenous Given 04/20/19 1441)  insulin aspart (novoLOG) injection 8 Units (8 Units Intravenous Given 04/20/19 1446)    ED Course  I have reviewed the triage vital signs and the nursing notes.  Pertinent labs & imaging results that were available during my care of the patient were reviewed by me and considered in my medical decision making (see chart for details).    MDM Rules/Calculators/A&P   IV ns x 2. Continuous pulse ox and monitor. o2 mask. Stat labs. Stat pcxr. Ecg.   On arrival hr 60. Patient becomes bradycardic, hr 30, bp low.  Iv ns boluses. Atropine iv. With atropine - hr low 60s with improvement in BP.  Patient very pale appearing, hx gi bleeding, blood in stool, and bp very - emergently transfuse unit prbc as labs pending.  Critical care team consulted for admission.   istat chem return - k is high. Ca gluc iv. d50 iv. hc03 iv. Insulin iv.   With ns bolus, atropine, treatment high k, and prbc - pts color and bp improved, alertness improved. Will wean down/off epi gtt.   Continue  to wean epi gtt.  Recheck pt, alert, no new c/o. Denies pain, no headache. Oriented to person/place - still no recall of earlier events.   bp is much improved.   Wean epi gtt to off.   Neurochecks - no new focal numbness/weakness.   Pt on additional recheck denies pain. No headache. No chest pain. No abd pain or tenderness.   CRITICAL CARE RE: Bradyasystolic cardiopulm arrest. hyprekalemic arrest, s/p CPR x 15 min, AKI, hyperkalemia, heme positive stools, gen weakness.  Performed by: Mirna Mires Total critical care time: 130 minutes Critical care time was exclusive of separately billable procedures and treating other patients. Critical care was necessary to treat or prevent imminent or life-threatening deterioration. Critical care was time spent personally by me on the following activities: development of treatment plan with patient and/or surrogate as well as nursing, discussions with consultants, evaluation of patient's response to treatment, examination of patient, obtaining history from patient or surrogate, ordering and performing treatments and interventions, ordering and review of laboratory studies, ordering and review of radiographic studies, pulse oximetry and re-evaluation of patient's condition.  Critical care team re-paged. Signed out to Dr Roslynn Amble to facilitate admission.      Final Clinical Impression(s) / ED Diagnoses Final diagnoses:  None    Rx / DC Orders ED Discharge Orders    None       Lajean Saver, MD 04/20/19 1635

## 2019-04-20 NOTE — ED Notes (Signed)
Help get patient on the monitor did ekg shown to Dr Ashok Cordia patient is resting with nurses at bedside

## 2019-04-20 NOTE — ED Notes (Signed)
1st unit RBCs complete, 2nd unit started

## 2019-04-20 NOTE — ED Notes (Signed)
Pt has epi drip going at 50, fluids going wide open.

## 2019-04-20 NOTE — Progress Notes (Addendum)
RT notified to assess pt for wheezing. Pt with mostly clear/diminished bbs, but upper expiratory wheeze noted. Xopenex Q6 PRN ordered per RT protocol assessment. Pt still with upper airway wheeze post treatment. RT will continue to monitor.

## 2019-04-20 NOTE — ED Notes (Signed)
Date and time results received: 04/20/19  (use smartphrase ".now" to insert current time)  Test: lactic 4.6 Critical Value: lactic 4.6  Name of Provider Notified: olalere  Orders Received? Or Actions Taken?: Actions Taken: awaiting new orders

## 2019-04-20 NOTE — ED Notes (Signed)
2nd unit of RBCs complete.

## 2019-04-21 ENCOUNTER — Encounter (HOSPITAL_COMMUNITY): Payer: Self-pay | Admitting: Pulmonary Disease

## 2019-04-21 ENCOUNTER — Inpatient Hospital Stay (HOSPITAL_COMMUNITY): Payer: Medicare Other

## 2019-04-21 DIAGNOSIS — I469 Cardiac arrest, cause unspecified: Secondary | ICD-10-CM

## 2019-04-21 LAB — TYPE AND SCREEN
ABO/RH(D): O POS
Antibody Screen: NEGATIVE
Unit division: 0
Unit division: 0

## 2019-04-21 LAB — URINALYSIS, ROUTINE W REFLEX MICROSCOPIC
Bilirubin Urine: NEGATIVE
Glucose, UA: NEGATIVE mg/dL
Hgb urine dipstick: NEGATIVE
Ketones, ur: NEGATIVE mg/dL
Leukocytes,Ua: NEGATIVE
Nitrite: NEGATIVE
Protein, ur: 30 mg/dL — AB
Specific Gravity, Urine: 1.017 (ref 1.005–1.030)
pH: 5 (ref 5.0–8.0)

## 2019-04-21 LAB — BPAM RBC
Blood Product Expiration Date: 202101022359
Blood Product Expiration Date: 202101022359
ISSUE DATE / TIME: 202012121425
ISSUE DATE / TIME: 202012121425
Unit Type and Rh: 5100
Unit Type and Rh: 5100

## 2019-04-21 LAB — BASIC METABOLIC PANEL
Anion gap: 10 (ref 5–15)
BUN: 21 mg/dL (ref 8–23)
CO2: 19 mmol/L — ABNORMAL LOW (ref 22–32)
Calcium: 8.6 mg/dL — ABNORMAL LOW (ref 8.9–10.3)
Chloride: 109 mmol/L (ref 98–111)
Creatinine, Ser: 1.51 mg/dL — ABNORMAL HIGH (ref 0.61–1.24)
GFR calc Af Amer: 51 mL/min — ABNORMAL LOW (ref >60)
GFR calc non Af Amer: 44 mL/min — ABNORMAL LOW (ref >60)
Glucose, Bld: 87 mg/dL (ref 70–99)
Potassium: 4.8 mmol/L (ref 3.5–5.1)
Sodium: 138 mmol/L (ref 135–145)

## 2019-04-21 LAB — PROCALCITONIN: Procalcitonin: 0.34 ng/mL

## 2019-04-21 LAB — GLUCOSE, CAPILLARY
Glucose-Capillary: 78 mg/dL (ref 70–99)
Glucose-Capillary: 111 mg/dL — ABNORMAL HIGH (ref 70–99)
Glucose-Capillary: 89 mg/dL (ref 70–99)
Glucose-Capillary: 122 mg/dL — ABNORMAL HIGH (ref 70–99)
Glucose-Capillary: 116 mg/dL — ABNORMAL HIGH (ref 70–99)

## 2019-04-21 LAB — CBC
HCT: 33.3 % — ABNORMAL LOW (ref 39.0–52.0)
Hemoglobin: 11 g/dL — ABNORMAL LOW (ref 13.0–17.0)
MCH: 32.6 pg (ref 26.0–34.0)
MCHC: 33 g/dL (ref 30.0–36.0)
MCV: 98.8 fL (ref 80.0–100.0)
Platelets: 190 10*3/uL (ref 150–400)
RBC: 3.37 MIL/uL — ABNORMAL LOW (ref 4.22–5.81)
RDW: 17.7 % — ABNORMAL HIGH (ref 11.5–15.5)
WBC: 11 10*3/uL — ABNORMAL HIGH (ref 4.0–10.5)
nRBC: 0 % (ref 0.0–0.2)

## 2019-04-21 LAB — PHOSPHORUS: Phosphorus: 3.8 mg/dL (ref 2.5–4.6)

## 2019-04-21 LAB — TROPONIN I (HIGH SENSITIVITY): Troponin I (High Sensitivity): 12 ng/L (ref <18)

## 2019-04-21 LAB — ECHOCARDIOGRAM LIMITED
Height: 72 in
Weight: 4289.27 oz

## 2019-04-21 LAB — MAGNESIUM: Magnesium: 1.7 mg/dL (ref 1.7–2.4)

## 2019-04-21 MED ORDER — METHYLPREDNISOLONE SODIUM SUCC 40 MG IJ SOLR
40.0000 mg | Freq: Every day | INTRAMUSCULAR | Status: DC
  Administered 2019-04-21 – 2019-04-23 (×3): 40 mg via INTRAVENOUS
  Filled 2019-04-21 (×3): qty 1

## 2019-04-21 MED ORDER — CHLORHEXIDINE GLUCONATE CLOTH 2 % EX PADS
6.0000 | MEDICATED_PAD | Freq: Every day | CUTANEOUS | Status: DC
  Administered 2019-04-21: 6 via TOPICAL

## 2019-04-21 MED ORDER — IPRATROPIUM-ALBUTEROL 0.5-2.5 (3) MG/3ML IN SOLN
3.0000 mL | Freq: Four times a day (QID) | RESPIRATORY_TRACT | Status: DC
  Administered 2019-04-21 – 2019-04-22 (×6): 3 mL via RESPIRATORY_TRACT
  Filled 2019-04-21 (×6): qty 3

## 2019-04-21 MED ORDER — FUROSEMIDE 10 MG/ML IJ SOLN
40.0000 mg | Freq: Once | INTRAMUSCULAR | Status: AC
  Administered 2019-04-21: 40 mg via INTRAVENOUS
  Filled 2019-04-21: qty 4

## 2019-04-21 MED ORDER — ORAL CARE MOUTH RINSE
15.0000 mL | Freq: Two times a day (BID) | OROMUCOSAL | Status: DC
  Administered 2019-04-21 – 2019-04-23 (×5): 15 mL via OROMUCOSAL

## 2019-04-21 NOTE — Progress Notes (Signed)
  Echocardiogram 2D Echocardiogram has been performed.  Virgene Tirone A Jeannie Mallinger 04/21/2019, 8:27 AM

## 2019-04-21 NOTE — Progress Notes (Addendum)
NAME:  Darryl Diaz, MRN:  [id number], DOB:  Jul 19, 1940, LOS: 1 ADMISSION DATE:  04/20/2019, CONSULTATION DATE:  04/20/19 REFERRING MD:  Dr. Roslynn Amble, CHIEF COMPLAINT:  Cardiac arrest    Brief History   78 y/o M who presented to [facility] on 12/12 after reported fall.  Found to have HR in 20's by EMS.  Reported cardiac arrest in truck with 12 minutes of CPR s/p 3 epi before ROSC.  In ER, patient alert/talking with HR in the 40's post arrest.  Treated in ER with 2 units PRBC and EPI gtt.  Initial Hgb 10.2.   The patient was hospitalized in October 2020 for falls, weakness in the setting of anemia with FOBT positive. 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 Hospital course complicated by [facility].  He was restarted on Eliquis during that admit without further bleeding.  Past Medical History  Depression HTN HLD  GERD  AF - on Eliquis, toprol XL, cardizem, amiodarone  ETOH  [facility] Events   12/12 Admit post fall, HR in 20's with EMS, 12 min CPR  Consults:    Procedures:    Significant Diagnostic Tests:  EKG 12/12 >> normal sinus rhythm ECHO 12/13 >>                                Micro Data:  COVID 12/12 >> POC negative    Antimicrobials:    Interim history/subjective:  Weaned off epi drip, on 3 L oxygen with no acute events No complaints today morning  Objective   Blood pressure (!) 139/59, pulse 79, temperature 98.6 F (37 C), temperature source Oral, resp. rate (!) 21, height 6' (1.829 m), weight 121.6 kg, SpO2 97 %.        Intake/Output Summary (Last 24 hours) at 04/21/2019 0751 Last data filed at 04/21/2019 7989 Gross per 24 hour  Intake 2034.49 ml  Output 350 ml  Net 1684.49 ml   Filed Weights   04/20/19 2030 04/21/19 0444  Weight: 122.5 kg 121.6 kg    Examination: Gen:      No acute distress HEENT:  EOMI, sclera anicteric Neck:     No masses; no thyromegaly Lungs:    Bilateral expiratory wheeze. CV:         Regular rate and rhythm;  no murmurs Abd:      + bowel sounds; soft, non-tender; no palpable masses, no distension Ext:    2+ edema; adequate peripheral perfusion Skin:      Warm and dry; no rash Neuro: alert and oriented x 3  Resolved Hospital Problem list     Assessment & Plan:   Bradycardic Arrest  Hypotension  Bradycardia Question if patient had true arrest or altered LOC with bradycardia and poor perfusion. Receive 12 minutes of CPR with EPI.  Possible component of volume depletion as despite LE swelling he appears dry on exam. Consider also medication mix-up with accidental overdose.  -Weaned off pressors -Check repeat lactic acid -Follow echocardiogram, holding home amiodarone, Toprol and Cardizem -May need cardiology to check on him prior to discharge.   Respiratory failure Has wheeze on examination.  Suspect he may have underlying COPD vs pulm edema We will give him a short course of prednisone and standing nebs. Lasix 40 mg x 1  AKI > improving Follow urine output and creatinine  Anemia  Hx of blood loss in setting of pan-gastritis in 02/2019  on eliquis for AF. FOBT positive on admit.  Follow hemoglobin, continue PPI. Holding Eliquis  HTN HLD  AF Holding home cardizem, toprol, eliquis   Former ETOH Abuse  Depression  Insomnia  Monitor for evidence of withdrawal.  Reports no use in last 2 months Hold trazodone, lexapro  BPH Hold home flomax  Best practice:  Diet: Advance diet Pain/Anxiety/Delirium protocol (if indicated): n/a VAP protocol (if indicated): n/a DVT prophylaxis: SCD's  GI prophylaxis: Protonix  Glucose control:  Mobility: BR  Code Status: Full Code Family Communication: Patient updated on plan of care Disposition:to PCU and TRH service  Labs   Bicarb 19, BUN/creatinine 21/1.51 WBC 11, hemoglobin 11, platelets 190, troponin 12 Cortisol 39.1  Chest x-ray 12/13-mild left interstitial opacity.     Marshell Garfinkel MD [facility] Please see [email or web address] for pager details.  04/21/2019, 7:59 AM

## 2019-04-21 NOTE — Plan of Care (Signed)
TRH  pick up from PCCM on 12/14.  See TRH communication for further details.

## 2019-04-21 NOTE — Progress Notes (Signed)
Pt transferred to 4E05 from 2 H. Pt put on the monitor and oxygen. Chair alarm is on. Pt is educated to call a nurse when needed and not to get up on hs own. Pt verbalizes understanding.

## 2019-04-21 NOTE — Progress Notes (Addendum)
2315 Bedside shift report. Pt resting in bed, NAD, pt denies pain, SOB at rest. Pt assessed, see flowsheet. Pt noted to have right hand tremors and jaw tremors at times. Pt stated,  "It's a nervous tick".  No needs at this time, W[facility].   2345 Pt attempting to get OOB. RN assisted pt to side of bed to urinate, unable to use urinal, incontinent episode. Pt assisted up to chair, linen changed and peri care performed. Pt with expiratory wheezes, SOB on exertion, condom cath placed on patient. Pt resting comfortably in bed, VSS, fall precautions in place, WCTM.  0115 RN called to room, pt stated their was an elderly lady who came to his room looking for her room. RN was at desk outside of patient's room, reoriented patient that there was no one walking around the hallway. Pt stated he must of been dreaming. Fall precautions in place, [facility].   0130 RN can hear pt speaking to himself. RN asked pt who he was talking with and pt stated, "himself". Pt states he frequently speaks to himself. RN WCTM.   6269 Pt heard with congested cough, IVF stopped. Pt states SOB on exertion, lab here to draw blood. Pt instructed to cough and deep breath, WCTM.   0430 RN assisted pt up to [facility], pt had med BM, condom cath replaced, peri care performed, no other needs at this time, [facility].   0615 Pt called RN to room, assisted pt up to [facility], pt had BM, condom cath replaced and peri-care performed, pt resting comfortably in bed. Fall precautions in place, [facility].   0730 Bedside shift report given to Gae Bon, Therapist, sports. No needs at this time.

## 2019-04-22 ENCOUNTER — Inpatient Hospital Stay (HOSPITAL_COMMUNITY): Payer: Medicare Other

## 2019-04-22 ENCOUNTER — Telehealth: Payer: Self-pay | Admitting: Cardiology

## 2019-04-22 DIAGNOSIS — J9601 Acute respiratory failure with hypoxia: Secondary | ICD-10-CM

## 2019-04-22 DIAGNOSIS — R079 Chest pain, unspecified: Secondary | ICD-10-CM

## 2019-04-22 DIAGNOSIS — N179 Acute kidney failure, unspecified: Secondary | ICD-10-CM

## 2019-04-22 DIAGNOSIS — D649 Anemia, unspecified: Secondary | ICD-10-CM

## 2019-04-22 LAB — GLUCOSE, CAPILLARY
Glucose-Capillary: 102 mg/dL — ABNORMAL HIGH (ref 70–99)
Glucose-Capillary: 110 mg/dL — ABNORMAL HIGH (ref 70–99)
Glucose-Capillary: 119 mg/dL — ABNORMAL HIGH (ref 70–99)
Glucose-Capillary: 119 mg/dL — ABNORMAL HIGH (ref 70–99)
Glucose-Capillary: 125 mg/dL — ABNORMAL HIGH (ref 70–99)
Glucose-Capillary: 125 mg/dL — ABNORMAL HIGH (ref 70–99)
Glucose-Capillary: 145 mg/dL — ABNORMAL HIGH (ref 70–99)

## 2019-04-22 LAB — CBC
HCT: 33.7 % — ABNORMAL LOW (ref 39.0–52.0)
Hemoglobin: 11.3 g/dL — ABNORMAL LOW (ref 13.0–17.0)
MCH: 32 pg (ref 26.0–34.0)
MCHC: 33.5 g/dL (ref 30.0–36.0)
MCV: 95.5 fL (ref 80.0–100.0)
Platelets: 182 10*3/uL (ref 150–400)
RBC: 3.53 MIL/uL — ABNORMAL LOW (ref 4.22–5.81)
RDW: 16.9 % — ABNORMAL HIGH (ref 11.5–15.5)
WBC: 10.4 10*3/uL (ref 4.0–10.5)
nRBC: 0 % (ref 0.0–0.2)

## 2019-04-22 LAB — TSH: TSH: 3.727 u[IU]/mL (ref 0.350–4.500)

## 2019-04-22 LAB — PHOSPHORUS: Phosphorus: 3.5 mg/dL (ref 2.5–4.6)

## 2019-04-22 LAB — BASIC METABOLIC PANEL
Anion gap: 10 (ref 5–15)
BUN: 15 mg/dL (ref 8–23)
CO2: 24 mmol/L (ref 22–32)
Calcium: 9.1 mg/dL (ref 8.9–10.3)
Chloride: 106 mmol/L (ref 98–111)
Creatinine, Ser: 1.06 mg/dL (ref 0.61–1.24)
GFR calc Af Amer: >60 mL/min (ref >60)
GFR calc non Af Amer: >60 mL/min (ref >60)
Glucose, Bld: 122 mg/dL — ABNORMAL HIGH (ref 70–99)
Potassium: 3.8 mmol/L (ref 3.5–5.1)
Sodium: 140 mmol/L (ref 135–145)

## 2019-04-22 LAB — T4, FREE: Free T4: 1.18 ng/dL — ABNORMAL HIGH (ref 0.61–1.12)

## 2019-04-22 LAB — MAGNESIUM: Magnesium: 1.5 mg/dL — ABNORMAL LOW (ref 1.7–2.4)

## 2019-04-22 LAB — BLOOD PRODUCT ORDER (VERBAL) VERIFICATION

## 2019-04-22 LAB — PROCALCITONIN: Procalcitonin: 0.27 ng/mL

## 2019-04-22 LAB — LACTIC ACID, PLASMA: Lactic Acid, Venous: 2.1 mmol/L (ref 0.5–1.9)

## 2019-04-22 MED ORDER — PANTOPRAZOLE SODIUM 40 MG PO TBEC
40.0000 mg | DELAYED_RELEASE_TABLET | Freq: Two times a day (BID) | ORAL | Status: DC
  Administered 2019-04-22 – 2019-04-23 (×3): 40 mg via ORAL
  Filled 2019-04-22 (×3): qty 1

## 2019-04-22 MED ORDER — NITROGLYCERIN 0.4 MG SL SUBL
0.4000 mg | SUBLINGUAL_TABLET | SUBLINGUAL | Status: DC | PRN
  Administered 2019-04-22 (×2): 0.4 mg via SUBLINGUAL

## 2019-04-22 MED ORDER — NITROGLYCERIN 0.4 MG SL SUBL
SUBLINGUAL_TABLET | SUBLINGUAL | Status: AC
  Filled 2019-04-22: qty 1

## 2019-04-22 NOTE — Progress Notes (Signed)
Critical care MD [place] called back, when MD called back pt was having chest pain 4/10, mid chest , pressure not radiating . Pt stated it feels like gas pain or pain from CPR. Pt stated it feels like pain that he felt last night but little more intense. 02 sat 96% in 3l o2 via [place]. Notified MD I was about to do 12 lead EKG. MD placed order for SL nitro and said that should help with BP too. Will continue to monitor.

## 2019-04-22 NOTE — Progress Notes (Signed)
Physical Therapy Treatment Patient Details Name: Darryl Diaz MRN: [id number] DOB: Mar 24, 1941 Today's Date: 04/22/2019    History of Present Illness 78 y/o M who presented to [facility] on 12/12 after reported fall.  Found to have HR in 20's by EMS.  Reported cardiac arrest in truck with 12 minutes of CPR s/p 3 epi before ROSC.  In ER, patient alert/talking with HR in the 40's post arrest.  Treated in ER with 2 units PRBC and EPI gtt.  Initial Hgb 10.2.     PT Comments    Pt admitted with/for fall, cardiac arrest with CPR.  Pt is a bit sore and mildly unsteady with mobility needing min guard.  Pt currently limited functionally due to the problems listed below.  (see problems list.)  Pt will benefit from PT to maximize function and safety to be able to get home safely with available assist.    Follow Up Recommendations  Home health PT;Supervision - Intermittent     Equipment Recommendations  None recommended by PT    Recommendations for Other Services       Precautions / Restrictions Precautions Precautions: Fall    Mobility  Bed Mobility Overal bed mobility: Needs Assistance Bed Mobility: Supine to Sit;Sit to Supine     Supine to sit: Supervision Sit to supine: Supervision   General bed mobility comments: Uses momentum to throw his leg out of or into bed.  Transfers Overall transfer level: Needs assistance   Transfers: Sit to/from Stand Sit to Stand: Min guard         General transfer comment: uses hands to push himself forward and stands up without stabilizing on the bed frame.  Ambulation/Gait Ambulation/Gait assistance: Min guard Gait Distance (Feet): 120 Feet Assistive device: None Gait Pattern/deviations: Step-through pattern Gait velocity: moderate Gait velocity interpretation: 1.31 - 2.62 ft/sec, indicative of limited community ambulator General Gait Details: eradic staccato wide BOS gait pattern with minimal heel toe.  He was quick to fatigue.  Will try  next session with assistive device.   Stairs             Wheelchair Mobility    Modified Rankin (Stroke Patients Only)       Balance Overall balance assessment: Needs assistance   Sitting balance-Leahy Scale: Fair       Standing balance-Leahy Scale: Fair                              Cognition Arousal/Alertness: Awake/alert Behavior During Therapy: WFL for tasks assessed/performed Overall Cognitive Status: Within Functional Limits for tasks assessed                                        Exercises      General Comments General comments (skin integrity, edema, etc.): SpO2 on RA dropped slowly to 85% during gait with 2-3/4 dyspnea.        Pertinent Vitals/Pain Pain Assessment: Faces Faces Pain Scale: Hurts a little bit Pain Location: chest discomfort from CPR Pain Descriptors / Indicators: Aching;Sore Pain Intervention(s): Monitored during session    Home Living Family/patient expects to be discharged to:: Private residence Living Arrangements: Alone Available Help at Discharge: Family;Available PRN/intermittently Type of Home: House Home Access: Level entry   Home Layout: Two level;Able to live on main level with bedroom/bathroom Home Equipment: Gilford Rile - 2 wheels;Cane - single  point;Shower seat;Shower seat - built in;Walker - 4 wheels;Toilet riser Additional Comments: still drives, has a gym in his garage    Prior Function Level of Independence: Independent with assistive device(s)      Comments: started using a RW this past week, has a maid that comes every other week and yard service.    PT Goals (current goals can now be found in the care plan section) Acute Rehab PT Goals Patient Stated Goal: wants to go home from the hospital PT Goal Formulation: With patient Time For Goal Achievement: 04/29/19 Potential to Achieve Goals: Good    Frequency    Min 3X/week      PT Plan      Co-evaluation               AM-PAC PT "6 Clicks" Mobility   Outcome Measure  Help needed turning from your back to your side while in a flat bed without using bedrails?: None Help needed moving from lying on your back to sitting on the side of a flat bed without using bedrails?: None Help needed moving to and from a bed to a chair (including a wheelchair)?: A Little Help needed standing up from a chair using your arms (e.g., wheelchair or bedside chair)?: A Little Help needed to walk in hospital room?: A Little Help needed climbing 3-5 steps with a railing? : A Lot 6 Click Score: 19    End of Session   Activity Tolerance: Patient limited by fatigue;Patient tolerated treatment well Patient left: in bed;with call bell/phone within reach Nurse Communication: Mobility status PT Visit Diagnosis: Unsteadiness on feet (R26.81);Difficulty in walking, not elsewhere classified (R26.2)     Time: 6568-1275 PT Time Calculation (min) (ACUTE ONLY): 25 min  Charges:  $Gait Training: 8-22 mins                     04/22/2019  Ginger Carne., PT Acute Rehabilitation Services [phone]  (pager) [phone]  (office)   Darryl Diaz 04/22/2019, 2:41 PM

## 2019-04-22 NOTE — Progress Notes (Signed)
TRH MD by bedside while shift change, MD aware of pt's CP episode. MD stated she will consult. Cardiology.

## 2019-04-22 NOTE — Telephone Encounter (Signed)
New Message    Pt has TOC appt 12/29 @ 11am

## 2019-04-22 NOTE — Progress Notes (Signed)
Pt's pain 2.5/10 after 1st SL Nitro. BP 321'Y systolic. After 10 min another dose of SL nitro administered. Pt stated it has eased off some and it just hurts when he is breathing in with deep breath. Pain 1/10. 12 lead EKG has no acute changes. I called E link, talked to [facility] and updated on pt's condition. Also notifed there is EKG on Epic for review. SBP 248'G systolic, pt sitting up eating Breakfast. MD had started pt on home pantoprazole, administered . Will continue to monitor.

## 2019-04-22 NOTE — Progress Notes (Signed)
Paged care link RN Gretchenn @[phone] , notified of pt's BP being 170/78 left arm and 175/87 right arm. She said she will get in touch with critical care MD and get order for prn meds. Awaiting orders. Will continue to monitor.

## 2019-04-22 NOTE — Consult Note (Signed)
   [facility] Inpatient Consult   04/22/2019  Darryl Diaz 07-06-1940 [id number]  THN status: Active   Patient is currently active with [facility] for chronic disease management services in the Medicare Next Gen ACO.  Patient has been engaged by a [facility] Coordinator for post hospital follow up for transition of care from a skilled facility for complex disease management.  Plan: Follow for progress and needs with Inpatient [facility] team member and to make aware that [facility] following.   Of note, [facility] Care Management services does not replace or interfere with any services that are needed or arranged by inpatient [facility] care management team.  For additional questions or referrals please contact:  Natividad Brood, RN BSN [facility] Liaison  [phone] business mobile phone Toll free office [phone]  Fax number: [phone] [email or web address]

## 2019-04-22 NOTE — Plan of Care (Signed)
Poc progressing.  

## 2019-04-22 NOTE — Progress Notes (Signed)
PROGRESS NOTE    Darryl Diaz  HYW:[id number] DOB: 07/04/1940 DOA: 04/20/2019 PCP: [name], MD   Brief Narrative:  HPI on 04/20/2019 by Ms. Noe Gens, NP (PCCM) 78 y/o M who presented to [facility] on 12/12 after reported fall.   The patient was hospitalized in October 2020 for falls, weakness in the setting of anemia with FOBT positive. 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 Hospital course complicated by [facility].  He was restarted on Eliquis during that admit without further bleeding. Discharge Hgb 10/27 was 10.2. He lives at home independently and is able to prepare meals / perform ADL's.  Reports he has been feeling tired / weak for one month. "Just haven't felt myself".    The patient called EMS on 12/12 for a fall at home.  On EMS arrival, he reportedly had a HR in the 20's.  During transport he suffered a reported cardiac arrest in truck with 12 minutes of CPR s/p 3 epi before ROSC. Normal CBG. On arrival to the ER the patient was alert/talking with HR in the 40's post arrest.  He was on an EPI gtt on arrival to ER with HR in the 40's.  He was treated with 2 units of emergency release blood given history of GIB and being quite pale.  His alertness improved with atropine.  Labs notable for WBC 9.3, Hgb 10.2, MCV 107.6, platelets 245, Na 133, K 5.9, Cl 107, CO2 13, glucose 261, Cr 1.89, HS-troponin 6.  EKG (upon my review), possible junctional / brady. FOBT positive. Temp 95.5.  PCCM called for ICU admit.   Interim history Patient admitted bradycardic arrest, hypotension.  Suspected secondary to possible medications.  Patient did require CPR with epinephrine.  Currently heart rate has been stable.  Cardiology consulted as patient started of chest pain today. Assessment & Plan   Bradycardiac arrest/hypotension -Patient was found to have heart rate in the 20s by EMS -Question if this was a true arrest or altered level of consciousness given bradycardia with poor perfusion  possibly secondary to accidental medication overdose -Patient did receive 12 minutes of CPR with epinephrine  -It was thought that maybe there is a component of volume depletion -Patient did require pressors but has been weaned off  -Echocardiogram EF of 60 to 65%, left atrial size severely dilated.  Revealed tricuspid regurgitation. -Home medications of amiodarone, metoprolol, Cardizem is been held -Cardiology consulted and appreciated  Chest pain -Suspect may be musculoskeletal given recent CPR.  Patient also states that it worsens with deep inspiration. -Patient was given 2 doses of nitroglycerin this morning, and chest pain has improved -As above, cardiology consulted and appreciated  Acute kidney injury -Upon admission, and was 1.89 -Suspect secondary to the above -Has resolved, creatinine currently 1.06  Chronic Normocytic Anemia -Patient with history of blood loss in the setting of pan-gastritis in 02/2019 while on Eliquis for Atrial fibrillation -Documentation, patient was transfused 2 units PRBC in the emergency department -FOBT +  -Continue PPI -Eliquis currently held -Hemoglobin currently stable, 11.3 -Continue monitor CBC  Acute hypoxic respiratory failure -Patient's oxygen saturations dropped to 85%, he was placed on 3 L of nasal cannula -Patient was also noted to have some wheezing on previous examination, suspected that he has underlying COPD versus pulmonary edema -PCCM placed patient on steroids and also gave him 1 dose of IV Lasix  Hypertension  -BP stable -will hold off on starting medications until patient seen by cardiology  Hyperlipidemia -Statin currently held  Atrial fibrillation  -Currently in sinus rhythm, rate controlled -As above home medications including Cardizem metoprolol and amiodarone have been held -Eliquis is also been held as patient did have positive FOBT on admission  Former Alcohol abuse -stable, evidence of withdrawal -No alcohol  use for the last 2 months  Depression -Lexapro held  Insomnia -Trazadone held  BPH -flomax held  Fall -Likely secondary to bradycardia -Consult PT and OT  DVT Prophylaxis  SCDs  Code Status: Full  Family Communication: None at bedside  Disposition Plan: Admitted. Pending cardiology consultation and recommendations. Dispo TBD  Consultants PCCM Cardiology  Procedures  Echocardiogram   Antibiotics   Anti-infectives (From admission, onward)   None      Subjective:   [name] seen and examined today.  Patient did have chest pain this morning but states he is no longer having it.  He does feel that it worsens with deep inspiration.  He currently denies any cough, or shortness of breath.  Denies any abdominal pain, nausea or vomiting, diarrhea or constipation, dizziness or headache. Objective:   Vitals:   04/22/19 0558 04/22/19 0606 04/22/19 0700 04/22/19 0809  BP: (!) 168/76 (!) 159/82  (!) 158/70  Pulse: 74 70 78 82  Resp: 16 18 16 19   Temp:    98.1 F (36.7 C)  TempSrc:    Oral  SpO2: 100% 98% 98% 100%  Weight:      Height:        Intake/Output Summary (Last 24 hours) at 04/22/2019 0944 Last data filed at 04/22/2019 0457 Gross per 24 hour  Intake 0 ml  Output 3310 ml  Net -3310 ml   Filed Weights   04/20/19 2030 04/21/19 0444 04/22/19 0454  Weight: 122.5 kg 121.6 kg 114.1 kg    Exam  General: Well developed, well nourished, NAD, appears stated age  HEENT: NCAT, mucous membranes moist.   Cardiovascular: S1 S2 auscultated, RRR  Respiratory: Diminished breath sounds, no wheezing noted  Abdomen: Soft, nontender, nondistended, + bowel sounds  Extremities: warm dry without cyanosis clubbing. LE edema  Neuro: AAOx3, nonfocal  Psych: Pleasant, appropriate mood and affect   Data Reviewed: I have personally reviewed following labs and imaging studies  CBC: Recent Labs  Lab 04/20/19 1416 04/20/19 1425 04/20/19 2123 04/21/19 0248  04/22/19 0244  WBC 9.3  --   --  11.0* 10.4  HGB 10.2* 10.5* 11.3* 11.0* 11.3*  HCT 32.7* 31.0* 34.4* 33.3* 33.7*  MCV 107.6*  --   --  98.8 95.5  PLT 245  --   --  190 462   Basic Metabolic Panel: Recent Labs  Lab 04/20/19 1416 04/20/19 1425 04/21/19 0248 04/22/19 0244  NA 133* 134* 138 140  K 5.9* 5.8* 4.8 3.8  CL 107 108 109 106  CO2 13*  --  19* 24  GLUCOSE 261* 251* 87 122*  BUN 19 21 21 15   CREATININE 1.89* 1.70* 1.51* 1.06  CALCIUM 8.5*  --  8.6* 9.1  MG  --   --  1.7 1.5*  PHOS  --   --  3.8 3.5   GFR: Estimated Creatinine Clearance: 76.1 mL/min (by C-G formula based on SCr of 1.06 mg/dL). Liver Function Tests: No results for input(s): AST, ALT, ALKPHOS, BILITOT, PROT, ALBUMIN in the last 168 hours. No results for input(s): LIPASE, AMYLASE in the last 168 hours. No results for input(s): AMMONIA in the last 168 hours. Coagulation Profile: Recent Labs  Lab 04/20/19 1416  INR 1.6*  Cardiac Enzymes: No results for input(s): CKTOTAL, CKMB, CKMBINDEX, TROPONINI in the last 168 hours. BNP (last 3 results) No results for input(s): PROBNP in the last 8760 hours. HbA1C: No results for input(s): HGBA1C in the last 72 hours. CBG: Recent Labs  Lab 04/21/19 1639 04/21/19 2029 04/22/19 0008 04/22/19 0453 04/22/19 0811  GLUCAP 122* 116* 125* 110* 125*   Lipid Profile: No results for input(s): CHOL, HDL, LDLCALC, TRIG, CHOLHDL, LDLDIRECT in the last 72 hours. Thyroid Function Tests: No results for input(s): TSH, T4TOTAL, FREET4, T3FREE, THYROIDAB in the last 72 hours. Anemia Panel: No results for input(s): VITAMINB12, FOLATE, FERRITIN, TIBC, IRON, RETICCTPCT in the last 72 hours. Urine analysis:    Component Value Date/Time   COLORURINE YELLOW 04/21/2019 0307   APPEARANCEUR CLEAR 04/21/2019 0307   LABSPEC 1.017 04/21/2019 0307   PHURINE 5.0 04/21/2019 0307   GLUCOSEU NEGATIVE 04/21/2019 0307   HGBUR NEGATIVE 04/21/2019 0307   BILIRUBINUR NEGATIVE  04/21/2019 0307   KETONESUR NEGATIVE 04/21/2019 0307   PROTEINUR 30 (A) 04/21/2019 0307   NITRITE NEGATIVE 04/21/2019 0307   LEUKOCYTESUR NEGATIVE 04/21/2019 0307   Sepsis Labs: @LABRCNTIP (procalcitonin:4,lacticidven:4)  )No results found for this or any previous visit (from the past 240 hour(s)).    Radiology Studies: DG Chest Port 1 View  Result Date: 04/22/2019 CLINICAL DATA:  Acute respiratory failure EXAM: PORTABLE CHEST 1 VIEW COMPARISON:  Yesterday FINDINGS: Artifact from EKG leads. Significant improvement in pulmonary opacity consistent with improved edema. Trace pleural fluid may be present. Stable or diminished cardiac size. No pneumothorax IMPRESSION: Significantly improved interstitial opacity consistent with resolved edema. Electronically Signed   By: Monte Fantasia M.D.   On: 04/22/2019 07:31   DG Chest Port 1 View  Result Date: 04/21/2019 CLINICAL DATA:  78 year old male status post arrest, bradycardia. Negative for COVID-19 yesterday. EXAM: PORTABLE CHEST 1 VIEW COMPARISON:  04/20/2019 portable chest and earlier. FINDINGS: Portable AP upright view at 0618 hours. Increased coarse pulmonary interstitial opacity throughout the right lung, more pronounced at the lung bases. Comparatively mild increased left lung interstitial opacity. Larger lung volumes. Pacer or resuscitation pads continue to project over the chest. Stable cardiac size and mediastinal contours. Visualized tracheal air column is within normal limits. No pneumothorax or pleural effusion. Stable visualized osseous structures. IMPRESSION: 1. New asymmetric right greater than left pulmonary interstitial opacity. Top differential considerations include acute viral/atypical respiratory infection and asymmetric pulmonary edema. 2. No pleural effusion is evident. Electronically Signed   By: Genevie Ann M.D.   On: 04/21/2019 11:06   DG Chest Portable 1 View  Result Date: 04/20/2019 CLINICAL DATA:  Fall. EXAM: PORTABLE CHEST  1 VIEW COMPARISON:  February 25, 2019. FINDINGS: Mild cardiomegaly is noted. Minimal bibasilar subsegmental atelectasis is noted. No pneumothorax or pleural effusion is noted. The visualized skeletal structures are unremarkable. IMPRESSION: Minimal bibasilar subsegmental atelectasis. Electronically Signed   By: Marijo Conception M.D.   On: 04/20/2019 14:35   ECHOCARDIOGRAM LIMITED  Result Date: 04/21/2019   ECHOCARDIOGRAM LIMITED REPORT   Patient Name:   MORSE BRUEGGEMANN Date of Exam: 04/21/2019 Medical Rec #:  [id number]      Height:       72.0 in Accession #:    [id number]     Weight:       268.1 lb Date of Birth:  04-20-1941      BSA:          2.41 m Patient Age:    68 years  BP:           139/59 mmHg Patient Gender: M              HR:           79 bpm. Exam Location:  Inpatient  Procedure: Limited Echo Indications:    Cardiac arrest I46.9  History:        Patient has prior history of Echocardiogram examinations, most                 recent 02/26/2019.  Sonographer:    Vikki Ports T[place]ine Referring Phys: [id number] [place]  1. The left ventricle has no regional wall motion abnormalities.  2. LVEF is 60-65%.  3. Left atrial size was severely dilated.  4. The mitral valve was not assessed. not assessed mitral valve regurgitation. No evidence of mitral stenosis.  5. The tricuspid valve is normal in structure. Tricuspid valve regurgitation is trivial.  6. The pulmonic valve was not well visualized. Pulmonic valve regurgitation not assessed.  7. Limited echo s/p cardiac arrest  8. Not assessed.  9. The aortic valve was not assessed. Aortic valve regurgitation not assessed. 10. Left ventricular ejection fraction, by visual estimation, is 60 to 65%. The left ventricle has normal function. There is not assseded. 11. Global right ventricle has normal systolic function.The right ventricular size is not assessed. Right vetricular wall thickness was not assessed. FINDINGS  Left Ventricle: Left ventricular  ejection fraction, by visual estimation, is 60 to 65%. The left ventricle has normal function. The left ventricle has no regional wall motion abnormalities. There is no increased left ventricular wall thickness. LVEF is 60-65%. Right Ventricle: The right ventricular size is NWV. Global RV systolic function is has normal systolic function. Left Atrium: Left atrial size was severely dilated. Right Atrium: Right atrial size was not well visualized. Right atrial pressure is estimated at 8 mmHg. Pericardium: There is no evidence of pericardial effusion. Mitral Valve: The mitral valve was not well visualized. No evidence of mitral valve stenosis by observation. MV Area by PHT, 3.23 cm. MV PHT, 68.15 msec. Mild mitral valve regurgitation. Tricuspid Valve: The tricuspid valve is normal in structure. Tricuspid valve regurgitation is trivial. Aortic Valve: The aortic valve was not assessed. The aortic valve was not well visualized. Aortic valve mean gradient measures 8.0 mmHg. Aortic valve peak gradient measures 13.4 mmHg. Pulmonic Valve: The pulmonic valve was not well visualized. Pulmonic valve regurgitation not assessed. Aorta: Not assessed. Venous: The inferior vena cava was not well visualized.   LV Volumes (MOD)             Normals LV area d, A2C:    27.80 cm LV area d, A4C:    36.80 cm LV area s, A2C:    15.70 cm LV area s, A4C:    22.10 cm LV major d, A2C:   7.18 cm LV major d, A4C:   7.91 cm LV major s, A2C:   5.83 cm LV major s, A4C:   7.24 cm LV vol d, MOD A2C: 90.2 ml   68 ml LV vol d, MOD A4C: 141.0 ml LV vol s, MOD A2C: 36.8 ml   24 ml LV vol s, MOD A4C: 56.1 ml LV SV MOD A2C:     53.4 ml LV SV MOD A4C:     141.0 ml LV SV MOD BP:      67.9 ml   45 ml LEFT ATRIUM  Index LA Vol (A2C):   121.0 ml 50.14 ml/m LA Vol (A4C):   139.0 ml 57.60 ml/m LA Biplane Vol: 136.0 ml 56.36 ml/m  AORTIC VALVE                    Normals AV Vmax:           182.81 cm/s AV Vmean:          134.228 cm/s 77 cm/s AV VTI:             0.365 m      3.15 cm2 AV Peak Grad:      13.4 mmHg AV Mean Grad:      8.0 mmHg     3 mmHg LVOT Vmax:         158.00 cm/s LVOT Vmean:        123.000 cm/s 75 cm/s LVOT VTI:          0.314 m      25.3 cm LVOT/AV VTI ratio: 0.86         1 MITRAL VALVE              Normals MV Area (PHT): 3.23 cm              SHUNTS MV PHT:        68.15 msec 55 ms      Systemic VTI: 0.31 m MV Decel Time: 235 msec   187 ms MV E velocity: 148.00 cm/s 103 cm/s MV A velocity: 122.00 cm/s 70.3 cm/s MV E/A ratio:  1.21        1.5  Carlyle Dolly MD Electronically signed by Carlyle Dolly MD Signature Date/Time: 04/21/2019/10:21:22 AMThe mitral valve was not well visualized.    Final      Scheduled Meds: . Chlorhexidine Gluconate Cloth  6 each Topical Daily  . insulin aspart  0-9 Units Subcutaneous Q4H  . ipratropium-albuterol  3 mL Nebulization Q6H  . mouth rinse  15 mL Mouth Rinse BID  . methylPREDNISolone (SOLU-MEDROL) injection  40 mg Intravenous Daily  . nitroGLYCERIN      . pantoprazole  40 mg Oral BID AC  . thiamine injection  100 mg Intravenous Daily   Continuous Infusions: . sodium chloride Stopped (04/21/19 0216)  . sodium chloride Stopped (04/20/19 2109)     LOS: 2 days   Time Spent in minutes   45 minutes  Elick Aguilera D.O. on 04/22/2019 at 9:44 AM  Between 7am to 7pm - Please see pager noted on [email or web address]  After 7pm go to [email or web address]  And look for the night coverage person covering for me after hours  [facility] Office  [phone]

## 2019-04-22 NOTE — Progress Notes (Signed)
Occupational Therapy Evaluation Patient Details Name: Darryl Diaz MRN: [id number] DOB: 06-May-1941 Today's Date: 04/22/2019    History of Present Illness 78 y/o M who presented to [facility] on 12/12 after reported fall.  Found to have HR in 20's by EMS.  Reported cardiac arrest in truck with 12 minutes of CPR s/p 3 epi before ROSC.  In ER, patient alert/talking with HR in the 40's post arrest.  Treated in ER with 2 units PRBC and EPI gtt.  Initial Hgb 10.2.    Clinical Impression   PTA, pt lived alone and was independent with ADL and mobility. Pt states he had multiple falls 5 weeks ago. Pt able to ambulate 100 ft and complete ADL @ sink level with min guard A @ RW level. SpO2 lowest 91 during mobility using R earlobe probe. Pt left on RA with SpO2 @ 97. Feel pt is appropriate for [place] with program like [facility]. Will follow acutely.     Follow Up Recommendations  Home health OT;Supervision/Assistance - 24 hour(initially)    Equipment Recommendations  None recommended by OT    Recommendations for Other Services       Precautions / Restrictions Precautions Precautions: Fall      Mobility Bed Mobility Overal bed mobility: Needs Assistance Bed Mobility: Supine to Sit;Sit to Supine     Supine to sit: Supervision Sit to supine: Supervision   General bed mobility comments: Uses momentum to throw his leg out of or into bed.  Transfers Overall transfer level: Needs assistance   Transfers: Sit to/from Stand Sit to Stand: Min guard         General transfer comment: uses hands to push himself forward and stands up without stabilizing on the bed frame.    Balance Overall balance assessment: Needs assistance   Sitting balance-Leahy Scale: Good       Standing balance-Leahy Scale: Fair                             ADL either performed or assessed with clinical judgement   ADL Overall ADL's : Needs assistance/impaired     Grooming: Modified  independent;Standing   Upper Body Bathing: Set up;Sitting   Lower Body Bathing: Min guard;Sit to/from stand   Upper Body Dressing : Set up;Sitting   Lower Body Dressing: Min guard;Sit to/from stand   Toilet Transfer: Min guard;RW;Ambulation   Toileting- Water quality scientist and Hygiene: Modified independent       Functional mobility during ADLs: Min guard;Rolling walker;Cueing for safety       Vision         Perception     Praxis      Pertinent Vitals/Pain Pain Assessment: Faces Faces Pain Scale: Hurts a little bit Pain Location: chest discomfort from CPR Pain Descriptors / Indicators: Aching;Sore Pain Intervention(s): Monitored during session     Hand Dominance Right   Extremity/Trunk Assessment Upper Extremity Assessment Upper Extremity Assessment: Overall WFL for tasks assessed   Lower Extremity Assessment Lower Extremity Assessment: Defer to PT evaluation   Cervical / Trunk Assessment Cervical / Trunk Assessment: Normal   Communication Communication Communication: No difficulties   Cognition Arousal/Alertness: Awake/alert Behavior During Therapy: WFL for tasks assessed/performed Overall Cognitive Status: No family/caregiver present to determine baseline cognitive functioning  General Comments: Appears at baseline; no recollection of events in ambulance; knew that he had CPR; will further assess   General Comments  Had 3 falls 5 weeks ago    Exercises Exercises: Other exercises Other Exercises Other Exercises: flutter valve x 10   Shoulder Instructions      Home Living Family/patient expects to be discharged to:: Private residence Living Arrangements: Alone Available Help at Discharge: Family;Available PRN/intermittently Type of Home: House Home Access: Level entry     Home Layout: Two level;Able to live on main level with bedroom/bathroom Alternate Level Stairs-Number of Steps: does not go  upstairs   Bathroom Shower/Tub: Occupational psychologist: Handicapped height Bathroom Accessibility: Yes How Accessible: Accessible via walker Home Equipment: [place] - 2 wheels;Cane - single point;Shower seat;Shower seat - built in;Walker - 4 wheels;Toilet riser;Hand held shower head(has grab bars to be installed)   Additional Comments: still drives, has a gym in his garage      Prior Functioning/Environment Level of Independence: Independent with assistive device(s)        Comments: started using a RW this past week, has a maid that comes every other week and yard service.         OT Problem List: Decreased strength;Decreased activity tolerance;Impaired balance (sitting and/or standing);Decreased safety awareness;Decreased knowledge of use of DME or AE;Cardiopulmonary status limiting activity      OT Treatment/Interventions: Self-care/ADL training;Therapeutic exercise;Energy conservation;DME and/or AE instruction;Therapeutic activities;Cognitive remediation/compensation;Patient/family education;Balance training    OT Goals(Current goals can be found in the care plan section) Acute Rehab OT Goals Patient Stated Goal: wants to go home from the hospital OT Goal Formulation: With patient Time For Goal Achievement: 05/06/19 Potential to Achieve Goals: Good  OT Frequency: Min 3X/week   Barriers to D/C:            Co-evaluation              AM-PAC OT "6 Clicks" Daily Activity     Outcome Measure Help from another person eating meals?: None Help from another person taking care of personal grooming?: None Help from another person toileting, which includes using toliet, bedpan, or urinal?: A Little Help from another person bathing (including washing, rinsing, drying)?: A Little Help from another person to put on and taking off regular upper body clothing?: None Help from another person to put on and taking off regular lower body clothing?: A Little 6 Click Score:  21   End of Session Equipment Utilized During Treatment: Rolling walker;Gait belt Nurse Communication: Mobility status;Other (comment)(O2 Sats)  Activity Tolerance: Patient tolerated treatment well Patient left: in chair;with call bell/phone within reach;with chair alarm set  OT Visit Diagnosis: Unsteadiness on feet (R26.81);Muscle weakness (generalized) (M62.81);History of falling (Z91.81)                Time: 3419-3790 OT Time Calculation (min): 49 min Charges:  OT General Charges $OT Visit: 1 Visit OT Evaluation $OT Eval Moderate Complexity: 1 Mod OT Treatments $Self Care/Home Management : 23-37 mins  Maurie Boettcher, OT/L   Acute OT Clinical Specialist [place] Pager [phone] Office [phone]   [facility] 04/22/2019, 5:27 PM

## 2019-04-22 NOTE — Consult Note (Addendum)
Cardiology Consultation:   Patient ID: Darryl Diaz MRN: [id number]; DOB: 02-Apr-1941  Admit date: 04/20/2019 Date of Consult: 04/22/2019  Primary Care Provider: [name], MD Primary Cardiologist: Sherren Mocha, MD   Patient Profile:   Darryl Diaz is a 78 y.o. male with a hx of paroxysmal atrial fibrillation on Eliquis, hyperlipidemia, hypertension, chronic diastolic heart failure, previous alcohol abuse (quit 2017), GERD, depression and recent GI bleed who is being seen today for the evaluation of cardiac arrest/bradycardia/chest pain at the request of Dr. Ree Kida.   Most recently patient admitted October 2020 for GI bleed and fall.  Hemoglobin was 6.9 on admit requiring transfusion.  EGD showed Mallory Weiss tear and erosions/erythema in stomach. Concern for EtOH related gastritis.  Hospital course complicated by A. fib RVR and treated with IV amiodarone.  Transitionedto oral amiodarone,homeCardizem CD and metoprolol. Net diuresed 3.6L. He was deemed stable to resume anticoagulation on 10/23 and PPI was increased.  He was doing well on post hospital follow-up 03/29/2019.   History of Present Illness:   Darryl Diaz feeling fatigued and tired for past 2-week.  Due to persistent symptoms he called EMS 12/12 and noted bradycardic in 20s.  During in [place], he had cardiac arrest requiring CPR for 12 minutes and 3 rounds of epi before return of spontaneous circulation.  Upon arrival to ER his heart rate improved to 40s.  He was placed on atropine with improved heart rate.  He is maintaining sinus rhythm at rate of 80 to 90s.  Home amiodarone 200 mg daily, Cardizem CD 360 mg daily and Toprol-XL 200 mg daily on hold.  Patient has constant chest soreness from CPR however this morning it exacerbated.  He felt shortness at lower sternal or upper gastric area.  Could not tell if this pain is different from CPR or not.  He is here 2 sublingual nitroglycerin and pain resolved.  EKG this  morning without acute changes-personally reviewed.  He reports taking medication as prescribed.  Echocardiogram declined maintenance work with LV function. CXR this morning showed Significantly improved interstitial opacity consistent with resolved edema.  Heart Pathway Score:     Past Medical History:  Diagnosis Date  . 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 Atrial fibrillation ([place])   . Fall at home 03/28/2016  . GERD (gastroesophageal reflux disease)   . H/O seasonal allergies   . Hx of adenomatous colonic polyps 08/10/2017  . Hyperlipidemia   . Hypertension   . Insomnia   . 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 OA (osteoarthritis)    hands    Past Surgical History:  Procedure Laterality Date  . ANKLE SURGERY Left 2014   had rods put in   . BIOPSY  02/28/2019   Procedure: BIOPSY;  Surgeon: Milus Banister, MD;  Location: [facility] ENDOSCOPY;  Service: Endoscopy;;  . [place]  . ESOPHAGOGASTRODUODENOSCOPY (EGD) WITH PROPOFOL N/A 02/28/2019   Procedure: ESOPHAGOGASTRODUODENOSCOPY (EGD) WITH PROPOFOL;  Surgeon: Milus Banister, MD;  Location: [facility] ENDOSCOPY;  Service: Endoscopy;  Laterality: N/A;  . HIP ARTHROPLASTY Left 04/01/2016   Procedure: ARTHROPLASTY BIPOLAR HIP (HEMIARTHROPLASTY);  Surgeon: Paralee Cancel, MD;  Location: [place];  Service: Orthopedics;  Laterality: Left;  . HIP CLOSED REDUCTION Left 04/30/2016   Procedure: CLOSED REDUCTION HIP;  Surgeon: Wylene Simmer, MD;  Location: [place];  Service: Orthopedics;  Laterality: Left;  . TONSILLECTOMY       Inpatient  Medications: Scheduled Meds: . Chlorhexidine Gluconate Cloth  6 each Topical Daily  . insulin aspart  0-9 Units Subcutaneous Q4H  . ipratropium-albuterol  3 mL Nebulization Q6H  . mouth rinse  15 mL Mouth Rinse BID  . methylPREDNISolone (SOLU-MEDROL) injection  40 mg Intravenous Daily  . nitroGLYCERIN      . pantoprazole  40 mg Oral BID AC  . thiamine injection   100 mg Intravenous Daily   Continuous Infusions: . sodium chloride Stopped (04/21/19 0216)  . sodium chloride Stopped (04/20/19 2109)   PRN Meds: acetaminophen, levalbuterol, nitroGLYCERIN, ondansetron (ZOFRAN) IV  Allergies:    Allergies  Allergen Reactions  . Penicillins Other (See Comments)    Did it involve swelling of the face/tongue/throat, SOB, or low BP? Unknown Did it involve sudden or severe rash/hives, skin peeling, or any reaction on the inside of your mouth or nose? Unknown Did you need to seek medical attention at a hospital or doctor's office? Unknown When did it last happen?childhood reaction If all above answers are "NO", may proceed with cephalosporin use.    Social History:   Social History   Socioeconomic History  . Marital status: Widowed    Spouse name: Not on file  . Number of children: 2  . Years of education: college  . Highest education level: Not on file  Occupational History  . Occupation: retired  Tobacco Use  . Smoking status: Former Research scientist (life sciences)  . Smokeless tobacco: Never Used  Substance and Sexual Activity  . Alcohol use: No    Comment: 03/2016   i HAD ONE DRINK & THAT WAS THE FIRST DRINK i HAD IN A LONG TIME   . Drug use: No  . Sexual activity: Not on file  Other Topics Concern  . Not on file  Social History Narrative   The patient is divorced w/ 1 son 1 daughter   He is a retired Regulatory affairs officer, he lived in [place] but [employer] including windows on the world in the [place] room in [place]   He moved to [place] to be near his sister who lives in [place] alcoholic member of AA   2 caffeinated beverages daily prior smoker no drugs   Social Determinants of Radio broadcast assistant Strain:   . Difficulty of Paying Living Expenses: Not on file  Food Insecurity:   . Worried About Charity fundraiser in the Last Year: Not on file  . Ran Out of Food in the Last Year: Not on file    Transportation Needs:   . Lack of Transportation (Medical): Not on file  . Lack of Transportation (Non-Medical): Not on file  Physical Activity:   . Days of Exercise per Week: Not on file  . Minutes of Exercise per Session: Not on file  Stress:   . Feeling of Stress : Not on file  Social Connections:   . Frequency of Communication with Friends and Family: Not on file  . Frequency of Social Gatherings with Friends and Family: Not on file  . Attends Religious Services: Not on file  . Active Member of Clubs or Organizations: Not on file  . Attends Archivist Meetings: Not on file  . Marital Status: Not on file  Intimate Partner Violence:   . Fear of Current or Ex-Partner: Not on file  . Emotionally Abused: Not on file  . Physically Abused: Not on file  . Sexually Abused: Not on file  Family History:   Family History  Problem Relation Age of Onset  . Heart disease Mother 23  . Hypertension Mother   . Arthritis Mother   . Heart failure Father 48  . Stroke Maternal Aunt   . Heart failure Sister   . Colon cancer Neg Hx   . Esophageal cancer Neg Hx   . Stomach cancer Neg Hx   . Rectal cancer Neg Hx      ROS:  Please see the history of present illness.  All other ROS reviewed and negative.     Physical Exam/Data:   Vitals:   04/22/19 0558 04/22/19 0606 04/22/19 0700 04/22/19 0809  BP: (!) 168/76 (!) 159/82  (!) 158/70  Pulse: 74 70 78 82  Resp: 16 18 16 19   Temp:    98.1 F (36.7 C)  TempSrc:    Oral  SpO2: 100% 98% 98% 100%  Weight:      Height:        Intake/Output Summary (Last 24 hours) at 04/22/2019 1223 Last data filed at 04/22/2019 1126 Gross per 24 hour  Intake 240 ml  Output 4050 ml  Net -3810 ml   Last 3 Weights 04/22/2019 04/21/2019 04/20/2019  Weight (lbs) 251 lb 9.6 oz 268 lb 1.3 oz 270 lb 1 oz  Weight (kg) 114.125 kg 121.6 kg 122.5 kg     Body mass index is 34.12 kg/m.  General:  Well nourished, well developed, in no acute  distress HEENT: normal Lymph: no adenopathy Neck: no JVD Endocrine:  No thryomegaly Vascular: No carotid bruits; FA pulses 2+ bilaterally without bruits  Cardiac:  normal S1, S2; RRR; no murmur  Lungs: Expiratory wheezing throughout Abd: soft, nontender, no hepatomegaly  Ext: no edema Musculoskeletal:  No deformities, BUE and BLE strength normal and equal Skin: warm and dry  Neuro:  CNs 2-12 intact, no focal abnormalities noted Psych:  Normal affect   EKG:  The EKG was personally reviewed and demonstrates:  SR at rate of 80 bpm Telemetry:  Telemetry was personally reviewed and demonstrates: Sinus rhythm at rate of 80-90  Relevant CV Studies:  Echo 04/21/2019  1. The left ventricle has no regional wall motion abnormalities.  2. LVEF is 60-65%.  3. Left atrial size was severely dilated.  4. The mitral valve was not assessed. not assessed mitral valve regurgitation. No evidence of mitral stenosis.  5. The tricuspid valve is normal in structure. Tricuspid valve regurgitation is trivial.  6. The pulmonic valve was not well visualized. Pulmonic valve regurgitation not assessed.  7. Limited echo s/p cardiac arrest  8. Not assessed.  9. The aortic valve was not assessed. Aortic valve regurgitation not assessed. 10. Left ventricular ejection fraction, by visual estimation, is 60 to 65%. The left ventricle has normal function. There is not assseded. 11. Global right ventricle has normal systolic function.The right ventricular size is not assessed. Right vetricular wall thickness was not assessed.  Laboratory Data:  High Sensitivity Troponin:   Recent Labs  Lab 04/20/19 1416 04/20/19 1830 04/20/19 2123 04/20/19 2316  TROPONINIHS 6 9 10 12      Chemistry Recent Labs  Lab 04/20/19 1416 04/20/19 1425 04/21/19 0248 04/22/19 0244  NA 133* 134* 138 140  K 5.9* 5.8* 4.8 3.8  CL 107 108 109 106  CO2 13*  --  19* 24  GLUCOSE 261* 251* 87 122*  BUN 19 21 21 15   CREATININE 1.89*  1.70* 1.51* 1.06  CALCIUM 8.5*  --  8.6*  9.1  GFRNONAA 33*  --  44* >60  GFRAA 39*  --  51* >60  ANIONGAP 13  --  10 10    Hematology Recent Labs  Lab 04/20/19 1416 04/20/19 2123 04/21/19 0248 04/22/19 0244  WBC 9.3  --  11.0* 10.4  RBC 3.04*  --  3.37* 3.53*  HGB 10.2* 11.3* 11.0* 11.3*  HCT 32.7* 34.4* 33.3* 33.7*  MCV 107.6*  --  98.8 95.5  MCH 33.6  --  32.6 32.0  MCHC 31.2  --  33.0 33.5  RDW 16.4*  --  17.7* 16.9*  PLT 245  --  190 182    Radiology/Studies:  DG Chest Port 1 View  Result Date: 04/22/2019 CLINICAL DATA:  Acute respiratory failure EXAM: PORTABLE CHEST 1 VIEW COMPARISON:  Yesterday FINDINGS: Artifact from EKG leads. Significant improvement in pulmonary opacity consistent with improved edema. Trace pleural fluid may be present. Stable or diminished cardiac size. No pneumothorax IMPRESSION: Significantly improved interstitial opacity consistent with resolved edema. Electronically Signed   By: Monte Fantasia M.D.   On: 04/22/2019 07:31   DG Chest Port 1 View  Result Date: 04/21/2019 CLINICAL DATA:  78 year old male status post arrest, bradycardia. Negative for COVID-19 yesterday. EXAM: PORTABLE CHEST 1 VIEW COMPARISON:  04/20/2019 portable chest and earlier. FINDINGS: Portable AP upright view at 0618 hours. Increased coarse pulmonary interstitial opacity throughout the right lung, more pronounced at the lung bases. Comparatively mild increased left lung interstitial opacity. Larger lung volumes. Pacer or resuscitation pads continue to project over the chest. Stable cardiac size and mediastinal contours. Visualized tracheal air column is within normal limits. No pneumothorax or pleural effusion. Stable visualized osseous structures. IMPRESSION: 1. New asymmetric right greater than left pulmonary interstitial opacity. Top differential considerations include acute viral/atypical respiratory infection and asymmetric pulmonary edema. 2. No pleural effusion is evident.  Electronically Signed   By: Genevie Ann M.D.   On: 04/21/2019 11:06   DG Chest Portable 1 View  Result Date: 04/20/2019 CLINICAL DATA:  Fall. EXAM: PORTABLE CHEST 1 VIEW COMPARISON:  February 25, 2019. FINDINGS: Mild cardiomegaly is noted. Minimal bibasilar subsegmental atelectasis is noted. No pneumothorax or pleural effusion is noted. The visualized skeletal structures are unremarkable. IMPRESSION: Minimal bibasilar subsegmental atelectasis. Electronically Signed   By: Marijo Conception M.D.   On: 04/20/2019 14:35   ECHOCARDIOGRAM LIMITED  Result Date: 04/21/2019   ECHOCARDIOGRAM LIMITED REPORT   Patient Name:   Darryl Diaz Date of Exam: 04/21/2019 Medical Rec #:  [id number]      Height:       72.0 in Accession #:    [id number]     Weight:       268.1 lb Date of Birth:  Jun 17, 1940      BSA:          2.41 m Patient Age:    80 years       BP:           139/59 mmHg Patient Gender: M              HR:           79 bpm. Exam Location:  Inpatient  Procedure: Limited Echo Indications:    Cardiac arrest I46.9  History:        Patient has prior history of Echocardiogram examinations, most                 recent 02/26/2019.  Sonographer:    Vikki Ports T[place] Referring  Phys: [id number] [place]  1. The left ventricle has no regional wall motion abnormalities.  2. LVEF is 60-65%.  3. Left atrial size was severely dilated.  4. The mitral valve was not assessed. not assessed mitral valve regurgitation. No evidence of mitral stenosis.  5. The tricuspid valve is normal in structure. Tricuspid valve regurgitation is trivial.  6. The pulmonic valve was not well visualized. Pulmonic valve regurgitation not assessed.  7. Limited echo s/p cardiac arrest  8. Not assessed.  9. The aortic valve was not assessed. Aortic valve regurgitation not assessed. 10. Left ventricular ejection fraction, by visual estimation, is 60 to 65%. The left ventricle has normal function. There is not assseded. 11. Global right ventricle  has normal systolic function.The right ventricular size is not assessed. Right vetricular wall thickness was not assessed. FINDINGS  Left Ventricle: Left ventricular ejection fraction, by visual estimation, is 60 to 65%. The left ventricle has normal function. The left ventricle has no regional wall motion abnormalities. There is no increased left ventricular wall thickness. LVEF is 60-65%. Right Ventricle: The right ventricular size is NWV. Global RV systolic function is has normal systolic function. Left Atrium: Left atrial size was severely dilated. Right Atrium: Right atrial size was not well visualized. Right atrial pressure is estimated at 8 mmHg. Pericardium: There is no evidence of pericardial effusion. Mitral Valve: The mitral valve was not well visualized. No evidence of mitral valve stenosis by observation. MV Area by PHT, 3.23 cm. MV PHT, 68.15 msec. Mild mitral valve regurgitation. Tricuspid Valve: The tricuspid valve is normal in structure. Tricuspid valve regurgitation is trivial. Aortic Valve: The aortic valve was not assessed. The aortic valve was not well visualized. Aortic valve mean gradient measures 8.0 mmHg. Aortic valve peak gradient measures 13.4 mmHg. Pulmonic Valve: The pulmonic valve was not well visualized. Pulmonic valve regurgitation not assessed. Aorta: Not assessed. Venous: The inferior vena cava was not well visualized.   LV Volumes (MOD)             Normals LV area d, A2C:    27.80 cm LV area d, A4C:    36.80 cm LV area s, A2C:    15.70 cm LV area s, A4C:    22.10 cm LV major d, A2C:   7.18 cm LV major d, A4C:   7.91 cm LV major s, A2C:   5.83 cm LV major s, A4C:   7.24 cm LV vol d, MOD A2C: 90.2 ml   68 ml LV vol d, MOD A4C: 141.0 ml LV vol s, MOD A2C: 36.8 ml   24 ml LV vol s, MOD A4C: 56.1 ml LV SV MOD A2C:     53.4 ml LV SV MOD A4C:     141.0 ml LV SV MOD BP:      67.9 ml   45 ml LEFT ATRIUM              Index LA Vol (A2C):   121.0 ml 50.14 ml/m LA Vol (A4C):   139.0 ml  57.60 ml/m LA Biplane Vol: 136.0 ml 56.36 ml/m  AORTIC VALVE                    Normals AV Vmax:           182.81 cm/s AV Vmean:          134.228 cm/s 77 cm/s AV VTI:            0.365  m      3.15 cm2 AV Peak Grad:      13.4 mmHg AV Mean Grad:      8.0 mmHg     3 mmHg LVOT Vmax:         158.00 cm/s LVOT Vmean:        123.000 cm/s 75 cm/s LVOT VTI:          0.314 m      25.3 cm LVOT/AV VTI ratio: 0.86         1 MITRAL VALVE              Normals MV Area (PHT): 3.23 cm              SHUNTS MV PHT:        68.15 msec 55 ms      Systemic VTI: 0.31 m MV Decel Time: 235 msec   187 ms MV E velocity: 148.00 cm/s 103 cm/s MV A velocity: 122.00 cm/s 70.3 cm/s MV E/A ratio:  1.21        1.5  Carlyle Dolly MD Electronically signed by Carlyle Dolly MD Signature Date/Time: 04/21/2019/10:21:22 AMThe mitral valve was not well visualized.    Final     Assessment and Plan:   1. Sick sinus syndrome with history of paroxysmal atrial fibrillation with RVR He was admitted October 2020 with A. fib RVR in setting of GI bleed.  He was doing well on oral amiodarone, Cardizem and Toprol-XL.  For the past 2 weeks has noted fatigue and tiredness.  EMS found him in bradycardic in 20s with weakness bradycardic arrest.  ROSC after 12 minutes of CPR and 3 rounds of epi.  He was treated with atropine now wean it off.  Maintaining sinus rhythm at controlled ventricular rate of 80 to 90s.  Not on any rate control agent.  echocardiogram with preserved LV function.  2.  Chest pain -Patient has chest soreness from CPR however pain this morning was more intense.  His pain resolved after sublingual nitroglycerin x2.  EKG without acute ischemic changes.  Echocardiogram showed normal LV function.  3.  Paroxysmal atrial fibrillation -As above.  Maintaining sinus rhythm.  Eliquis on hold.  4.  History of GI bleed/ Anemia -Stool guaiac positive on admit.  Eliquis held>> recommended GI eval and guidance when to safely resume anticoagulation.   - Hgb improved to 11.3 today from 10.2 on admit.   5. Acute respiratory failure - Improved edema on repeat chest xray this morning. On steroids. Expiratory wheezing noted.  - Per primary team   6. AKI - Resolved   For questions or updates, please contact Daisy Please consult [email or web address] for contact info under     Jarrett Soho, PA  04/22/2019 12:23 PM   Personally seen and examined. Agree with above.   78 year old male with symptomatic bradycardia iatrogenic in the setting of amiodarone metoprolol and diltiazem.  Received CPR via EMS.  Currently stable.  GEN: Well nourished, well developed, in no acute distress  HEENT: normal  Neck: no JVD, carotid bruits, or masses Cardiac: RRR; no murmurs, rubs, or gallops,no edema  Respiratory:  clear to auscultation bilaterally, normal work of breathing, no significant wheezes GI: soft, nontender, nondistended, + BS MS: no deformity or atrophy  Skin: warm and dry, no rash Neuro:  Alert and Oriented x 3, Strength and sensation are intact Psych: euthymic mood, full affect  Hemoglobin stable from discharge  Telemetry currently sinus rhythm 80s  Original EKG junctional bradycardia with occasional P wave heart rate in the 20s  Assessment and plan:  Symptomatic bradycardia -Iatrogenic.  Hold amiodarone 200 mg a day, hold Cardizem CD 360 a day and hold metoprolol 200 mg a day.  Half-lives of both the Cardizem and the metoprolol are quite short, hence we are seeing normal rhythm with normal heart rates currently.  Amiodarone has quite a lengthy half-life, approximately 2 months. -Continue to hold these 3 medications.  Obviously if atrial fibrillation were to return and rapid ventricular response were to return.  We would need to gently reinitiate these medications with EP consultation for potential pacemaker placement given tachycardia, bradycardia syndrome. -We will have close follow-up in clinic in the next several  days.  Chest pain -Likely secondary to the several minutes of CPR performed.  Musculoskeletal.  No rib fractures on chest x-ray.  Nitroglycerin cessation likely serendipitous.  Anemia from prior GI bleed/paroxysmal atrial fibrillation -Hemoglobin is stable from discharge previously.  Consider continuing Eliquis with high-dose proton pump inhibitor as long as there is no significant drop in hemoglobin.  Please let us know if we can be of further assistance. Since his telemetry has stabilized, I am comfortable with discharge from a cardiology perspective.  We will go ahead and sign off.  Thank you  Candee Furbish, MD

## 2019-04-22 NOTE — Progress Notes (Signed)
eLink Physician-Brief Progress Note Patient Name: Darryl Diaz DOB: 09-09-1940 MRN: [id number]   Date of Service  04/22/2019  HPI/Events of Note  Notified of SBP 170s. Patient in a room that has no camera capabilities. On discussion with bedside RN patient reportedly having chest pains which he states is similar to the pain he experienced the night before.  eICU Interventions   EKG being obtained  Ordered NTG PRN  Cautious in resuming patient's home medications of metoprolol and diltiazem as he presented with bradycardic arrest  Patient with history of pangastritis, will resume pantoprazole BID     Intervention Category Major Interventions: Hypertension - evaluation and management  Shona Needles Verlean Allport 04/22/2019, 5:52 AM

## 2019-04-23 DIAGNOSIS — F329 Major depressive disorder, single episode, unspecified: Secondary | ICD-10-CM

## 2019-04-23 DIAGNOSIS — W19XXXA Unspecified fall, initial encounter: Secondary | ICD-10-CM

## 2019-04-23 DIAGNOSIS — Y92009 Unspecified place in unspecified non-institutional (private) residence as the place of occurrence of the external cause: Secondary | ICD-10-CM

## 2019-04-23 DIAGNOSIS — I10 Essential (primary) hypertension: Secondary | ICD-10-CM

## 2019-04-23 LAB — HEMOGLOBIN AND HEMATOCRIT, BLOOD
HCT: 35.6 % — ABNORMAL LOW (ref 39.0–52.0)
Hemoglobin: 12 g/dL — ABNORMAL LOW (ref 13.0–17.0)

## 2019-04-23 LAB — BASIC METABOLIC PANEL
Anion gap: 9 (ref 5–15)
BUN: 14 mg/dL (ref 8–23)
CO2: 23 mmol/L (ref 22–32)
Calcium: 9.1 mg/dL (ref 8.9–10.3)
Chloride: 106 mmol/L (ref 98–111)
Creatinine, Ser: 0.92 mg/dL (ref 0.61–1.24)
GFR calc Af Amer: >60 mL/min (ref >60)
GFR calc non Af Amer: >60 mL/min (ref >60)
Glucose, Bld: 104 mg/dL — ABNORMAL HIGH (ref 70–99)
Potassium: 3.8 mmol/L (ref 3.5–5.1)
Sodium: 138 mmol/L (ref 135–145)

## 2019-04-23 LAB — LACTIC ACID, PLASMA: Lactic Acid, Venous: 1 mmol/L (ref 0.5–1.9)

## 2019-04-23 LAB — GLUCOSE, CAPILLARY
Glucose-Capillary: 117 mg/dL — ABNORMAL HIGH (ref 70–99)
Glucose-Capillary: 133 mg/dL — ABNORMAL HIGH (ref 70–99)
Glucose-Capillary: 161 mg/dL — ABNORMAL HIGH (ref 70–99)

## 2019-04-23 MED ORDER — PREDNISONE 20 MG PO TABS: 40.0000 mg | ORAL_TABLET | Freq: Every day | ORAL | Status: DC

## 2019-04-23 MED ORDER — PREDNISONE 10 MG PO TABS: 40.0000 mg | ORAL_TABLET | Freq: Every day | ORAL | 0 refills | Status: DC

## 2019-04-23 MED ORDER — IPRATROPIUM-ALBUTEROL 0.5-2.5 (3) MG/3ML IN SOLN
3.0000 mL | Freq: Two times a day (BID) | RESPIRATORY_TRACT | Status: DC
  Administered 2019-04-23: 3 mL via RESPIRATORY_TRACT
  Filled 2019-04-23: qty 3

## 2019-04-23 NOTE — Discharge Summary (Signed)
Physician Discharge Summary  Darryl Diaz UMP:[id number] DOB: [birth year] DOA: 04/20/2019  PCP: [name], MD  Admit date: 04/20/2019 Discharge date: 04/23/2019  Time spent: 45 minutes  Recommendations for Outpatient Follow-up:  Patient will be discharged to home with home health physical and occupational therapy.  Patient will need to follow up with primary care provider within one week of discharge.  Follow-up with cardiology.  Patient should continue medications as prescribed.  Patient should follow a heart healthy diet.   Discharge Diagnoses:  Bradycardiac arrest/hypotension Chest pain Acute kidney injury Chronic Normocytic Anemia Acute hypoxic respiratory failure Hypertension  Hyperlipidemia Atrial fibrillation  Former Alcohol abuse Depression Insomnia BPH Fall  Discharge Condition: Stable  Diet recommendation: heart healthy  Filed Weights   04/21/19 0444 04/22/19 0454 04/23/19 0351  Weight: 121.6 kg 114.1 kg 110.4 kg    History of present illness:  on 04/20/2019 by Ms. Noe Gens, NP (PCCM) 78 y/o M who presented to [facility] on 12/12 after reported fall.   The patient was hospitalized in October 2020 for falls, weakness in the setting of anemia with FOBT positive. 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 Hospital course complicated by [facility]. He was restarted on Eliquis during that admit without further bleeding. Discharge Hgb 10/27 was 10.2. He lives at home independently and is able to prepare meals / perform ADL's. Reports he has been feeling tired / weak for one month. "Just haven't felt myself".   The patient called EMS on 12/12 for a fall at home. On EMS arrival, he reportedly had a HR in the 20's. During transport he suffered a reported cardiac arrest in truck with 12 minutes of CPR s/p 3 epi before ROSC. Normal CBG. On arrival to the ER the patient was alert/talking with HR in the 40's post arrest. He was on an EPI gtt on arrival to ER with HR  in the 40's. He was treated with 2 units of emergency release blood given history of GIB and being quite pale. His alertness improved with atropine. Labs notable for WBC 9.3, Hgb 10.2, MCV 107.6, platelets 245, Na 133, K 5.9, Cl 107, CO2 13, glucose 261, Cr 1.89, HS-troponin 6. EKG (upon my review), possible junctional / brady. FOBT positive. Temp 95.5.  PCCM called for ICU admit.  Hospital Course:  Bradycardiac arrest/hypotension -Patient was found to have heart rate in the 20s by EMS -Question if this was a true arrest or altered level of consciousness given bradycardia with poor perfusion possibly secondary to accidental medication overdose -Patient did receive 12 minutes of CPR with epinephrine  -It was thought that maybe there is a component of volume depletion -Patient did require pressors but has been weaned off  -Echocardiogram EF of 60 to 65%, left atrial size severely dilated.  Revealed tricuspid regurgitation. -Home medications of amiodarone, metoprolol, Cardizem is been held -Cardiology consulted and appreciated  Chest pain -Suspect may be musculoskeletal given recent CPR.  Patient also states that it worsens with deep inspiration. -Patient was given 2 doses of nitroglycerin this morning, and chest pain has improved -As above, cardiology consulted and appreciated  Acute kidney injury -Upon admission, and was 1.89 -Suspect secondary to the above -Has resolved, creatinine currently 0.92  Chronic Normocytic Anemia -Patient with history of blood loss in the setting of pan-gastritis in 02/2019 while on Eliquis for Atrial fibrillation -Documentation, patient was transfused 2 units PRBC in the emergency department -FOBT +  -Continue PPI -Eliquis currently held- restart as an outpatient with  PPI -Hemoglobin stable, 11.3 -Continue monitor CBC  Acute hypoxic respiratory failure -Patient's oxygen saturations dropped to 85%, he was placed on 3 L of nasal cannula -Patient  was also noted to have some wheezing on previous examination, suspected that he has underlying COPD versus pulmonary edema -PCCM placed patient on steroids and also gave him 1 dose of IV Lasix -continue steroid taper on discharge -Resolved, patient currently on room air and maintaining saturations in the high 90s  Hypertension  -BP stable  Hyperlipidemia -Statin currently held- restart upon discharge  Atrial fibrillation  -Currently in sinus rhythm, rate controlled -As above home medications including Cardizem metoprolol and amiodarone have been held -Eliquis is also been held as patient did have positive FOBT on admission  Former Alcohol abuse -stable, evidence of withdrawal -No alcohol use for the last 2 months  Depression -Lexapro held- restart on discharge  Insomnia -Trazadone held-restart on discharge  BPH -flomax held-restart on discharge  Fall -Likely secondary to bradycardia -PT and OT recommended [facility]  Consultants PCCM Cardiology  Procedures  Echocardiogram   Discharge Exam: Vitals:   04/23/19 0843 04/23/19 1212  BP:  (!) 150/79  Pulse:  90  Resp:  14  Temp:  98.6 F (37 C)  SpO2: 97% 98%     General: Well developed, well nourished, NAD, appears stated age  HEENT: NCAT, mucous membranes moist.  Cardiovascular: S1 S2 auscultated, RRR  Respiratory: Clear to auscultation bilaterally   Abdomen: Soft, nontender, nondistended, + bowel sounds  Extremities: warm dry without cyanosis clubbing or edema  Neuro: AAOx3, nonfocal  Psych: Pleasant, appropriate mood and affect  Discharge Instructions Discharge Instructions    Discharge instructions   Complete by: As directed    Patient will be discharged to home with home health physical and occupational therapy.  Patient will need to follow up with primary care provider within one week of discharge.  Follow-up with cardiology.  Patient should continue medications as prescribed.  Patient should  follow a heart healthy diet.     Allergies as of 04/23/2019      Reactions   Penicillins Other (See Comments)   Did it involve swelling of the face/tongue/throat, SOB, or low BP? Unknown Did it involve sudden or severe rash/hives, skin peeling, or any reaction on the inside of your mouth or nose? Unknown Did you need to seek medical attention at a hospital or doctor's office? Unknown When did it last happen?childhood reaction If all above answers are "NO", may proceed with cephalosporin use.      Medication List    STOP taking these medications   amiodarone 200 MG tablet Commonly known as: PACERONE   diltiazem 360 MG 24 hr capsule Commonly known as: CARDIZEM CD   metoprolol 200 MG 24 hr tablet Commonly known as: TOPROL-XL     TAKE these medications   acetaminophen 500 MG tablet Commonly known as: TYLENOL Take 1,000 mg by mouth every 6 (six) hours as needed for headache (pain).   apixaban 5 MG Tabs tablet Commonly known as: Eliquis Take 1 tablet (5 mg total) by mouth 2 (two) times daily.   atorvastatin 20 MG tablet Commonly known as: LIPITOR Take 20 mg by mouth daily.   escitalopram 10 MG tablet Commonly known as: LEXAPRO Take 10 mg by mouth daily.   ferrous sulfate 325 (65 FE) MG EC tablet Take 325 mg by mouth daily.   folic acid 1 MG tablet Commonly known as: FOLVITE Take 1 mg by mouth daily.  furosemide 40 MG tablet Commonly known as: LASIX Take 1 tablet by mouth twice a day for 3 days then go back to 1 tablet by mouth daily with the ok to take 1 tablet by mouth in the afternoon as needed for weight gain of 3 lbs in 1 day or 5 lbs in 3-5 days What changed:   how much to take  how to take this  when to take this  additional instructions   loratadine 10 MG tablet Commonly known as: CLARITIN Take 10 mg by mouth daily.   miconazole nitrate Powd Commonly known as: MICATIN Apply 1 application topically 3 (three) times daily. What changed:    when to take this  reasons to take this   multivitamin with minerals Tabs tablet Take 1 tablet by mouth daily.   pantoprazole 40 MG tablet Commonly known as: PROTONIX Take 1 tablet (40 mg total) by mouth 2 (two) times daily before a meal. What changed: when to take this   potassium chloride SA 20 MEQ tablet Commonly known as: KLOR-CON Take 2 tablets (40 mEq total) by mouth daily.   predniSONE 10 MG tablet Commonly known as: DELTASONE Take 4 tablets (40 mg total) by mouth daily with breakfast. Take 4 tabs x 1 day, then 3 tabs x 1 day, then 2 tabs x 1 day, then 1 tab x 1 day Start taking on: April 24, 2019   silver sulfADIAZINE 1 % cream Commonly known as: SILVADENE Apply topically 2 (two) times daily.   tamsulosin 0.4 MG Caps capsule Commonly known as: FLOMAX Take 0.4 mg by mouth at bedtime.   traZODone 50 MG tablet Commonly known as: DESYREL Take 50 mg by mouth at bedtime.   vitamin B-1 250 MG tablet Take 250 mg by mouth daily.      Allergies  Allergen Reactions  . Penicillins Other (See Comments)    Did it involve swelling of the face/tongue/throat, SOB, or low BP? Unknown Did it involve sudden or severe rash/hives, skin peeling, or any reaction on the inside of your mouth or nose? Unknown Did you need to seek medical attention at a hospital or doctor's office? Unknown When did it last happen?childhood reaction If all above answers are "NO", may proceed with cephalosporin use.   Follow-up Information    Tommie Raymond, NP. Go on 05/07/2019.   Specialty: Cardiology Why: @11am  for post hospital follow up with Dr. Antionette Char PA/NP Contact information: [address] [place] [address] [phone]        [name], MD. Schedule an appointment as soon as possible for a visit in 1 week(s).   Specialty: Internal Medicine Why: Hospital follow up Contact information: [place] [address] [phone]         Sherren Mocha, MD .   Specialty: Cardiology Contact information: [address] [place] [address] [phone]            The results of significant diagnostics from this hospitalization (including imaging, microbiology, ancillary and laboratory) are listed below for reference.    Significant Diagnostic Studies: DG Chest Port 1 View  Result Date: 04/22/2019 CLINICAL DATA:  Acute respiratory failure EXAM: PORTABLE CHEST 1 VIEW COMPARISON:  Yesterday FINDINGS: Artifact from EKG leads. Significant improvement in pulmonary opacity consistent with improved edema. Trace pleural fluid may be present. Stable or diminished cardiac size. No pneumothorax IMPRESSION: Significantly improved interstitial opacity consistent with resolved edema. Electronically Signed   By: Angelica Chessman  Watts M.D.   On: 04/22/2019 07:31   DG Chest Port 1 View  Result Date: 04/21/2019 CLINICAL DATA:  78 year old male status post arrest, bradycardia. Negative for COVID-19 yesterday. EXAM: PORTABLE CHEST 1 VIEW COMPARISON:  04/20/2019 portable chest and earlier. FINDINGS: Portable AP upright view at 0618 hours. Increased coarse pulmonary interstitial opacity throughout the right lung, more pronounced at the lung bases. Comparatively mild increased left lung interstitial opacity. Larger lung volumes. Pacer or resuscitation pads continue to project over the chest. Stable cardiac size and mediastinal contours. Visualized tracheal air column is within normal limits. No pneumothorax or pleural effusion. Stable visualized osseous structures. IMPRESSION: 1. New asymmetric right greater than left pulmonary interstitial opacity. Top differential considerations include acute viral/atypical respiratory infection and asymmetric pulmonary edema. 2. No pleural effusion is evident. Electronically Signed   By: Genevie Ann M.D.   On: 04/21/2019 11:06   DG Chest Portable 1 View  Result Date: 04/20/2019 CLINICAL DATA:   Fall. EXAM: PORTABLE CHEST 1 VIEW COMPARISON:  February 25, 2019. FINDINGS: Mild cardiomegaly is noted. Minimal bibasilar subsegmental atelectasis is noted. No pneumothorax or pleural effusion is noted. The visualized skeletal structures are unremarkable. IMPRESSION: Minimal bibasilar subsegmental atelectasis. Electronically Signed   By: Marijo Conception M.D.   On: 04/20/2019 14:35   ECHOCARDIOGRAM LIMITED  Result Date: 04/21/2019   ECHOCARDIOGRAM LIMITED REPORT   Patient Name:   Darryl Diaz Date of Exam: 04/21/2019 Medical Rec #:  [id number]      Height:       72.0 in Accession #:    [id number]     Weight:       268.1 lb Date of Birth:  [birth year]      BSA:          2.41 m Patient Age:    78 years       BP:           139/59 mmHg Patient Gender: M              HR:           79 bpm. Exam Location:  Inpatient  Procedure: Limited Echo Indications:    Cardiac arrest I46.9  History:        Patient has prior history of Echocardiogram examinations, most                 recent 02/26/2019.  Sonographer:    Vikki Ports [place] Referring Phys: [id number] [place]  1. The left ventricle has no regional wall motion abnormalities.  2. LVEF is 60-65%.  3. Left atrial size was severely dilated.  4. The mitral valve was not assessed. not assessed mitral valve regurgitation. No evidence of mitral stenosis.  5. The tricuspid valve is normal in structure. Tricuspid valve regurgitation is trivial.  6. The pulmonic valve was not well visualized. Pulmonic valve regurgitation not assessed.  7. Limited echo s/p cardiac arrest  8. Not assessed.  9. The aortic valve was not assessed. Aortic valve regurgitation not assessed. 10. Left ventricular ejection fraction, by visual estimation, is 60 to 65%. The left ventricle has normal function. There is not assseded. 11. Global right ventricle has normal systolic function.The right ventricular size is not assessed. Right vetricular wall thickness was not assessed. FINDINGS  Left  Ventricle: Left ventricular ejection fraction, by visual estimation, is 60 to 65%. The left ventricle has normal function. The left ventricle has no regional wall motion abnormalities. There is no increased left ventricular  wall thickness. LVEF is 60-65%. Right Ventricle: The right ventricular size is NWV. Global RV systolic function is has normal systolic function. Left Atrium: Left atrial size was severely dilated. Right Atrium: Right atrial size was not well visualized. Right atrial pressure is estimated at 8 mmHg. Pericardium: There is no evidence of pericardial effusion. Mitral Valve: The mitral valve was not well visualized. No evidence of mitral valve stenosis by observation. MV Area by PHT, 3.23 cm. MV PHT, 68.15 msec. Mild mitral valve regurgitation. Tricuspid Valve: The tricuspid valve is normal in structure. Tricuspid valve regurgitation is trivial. Aortic Valve: The aortic valve was not assessed. The aortic valve was not well visualized. Aortic valve mean gradient measures 8.0 mmHg. Aortic valve peak gradient measures 13.4 mmHg. Pulmonic Valve: The pulmonic valve was not well visualized. Pulmonic valve regurgitation not assessed. Aorta: Not assessed. Venous: The inferior vena cava was not well visualized.   LV Volumes (MOD)             Normals LV area d, A2C:    27.80 cm LV area d, A4C:    36.80 cm LV area s, A2C:    15.70 cm LV area s, A4C:    22.10 cm LV major d, A2C:   7.18 cm LV major d, A4C:   7.91 cm LV major s, A2C:   5.83 cm LV major s, A4C:   7.24 cm LV vol d, MOD A2C: 90.2 ml   68 ml LV vol d, MOD A4C: 141.0 ml LV vol s, MOD A2C: 36.8 ml   24 ml LV vol s, MOD A4C: 56.1 ml LV SV MOD A2C:     53.4 ml LV SV MOD A4C:     141.0 ml LV SV MOD BP:      67.9 ml   45 ml LEFT ATRIUM              Index LA Vol (A2C):   121.0 ml 50.14 ml/m LA Vol (A4C):   139.0 ml 57.60 ml/m LA Biplane Vol: 136.0 ml 56.36 ml/m  AORTIC VALVE                    Normals AV Vmax:           182.81 cm/s AV Vmean:           134.228 cm/s 77 cm/s AV VTI:            0.365 m      3.15 cm2 AV Peak Grad:      13.4 mmHg AV Mean Grad:      8.0 mmHg     3 mmHg LVOT Vmax:         158.00 cm/s LVOT Vmean:        123.000 cm/s 75 cm/s LVOT VTI:          0.314 m      25.3 cm LVOT/AV VTI ratio: 0.86         1 MITRAL VALVE              Normals MV Area (PHT): 3.23 cm              SHUNTS MV PHT:        68.15 msec 55 ms      Systemic VTI: 0.31 m MV Decel Time: 235 msec   187 ms MV E velocity: 148.00 cm/s 103 cm/s MV A velocity: 122.00 cm/s 70.3 cm/s MV E/A ratio:  1.21  1.5  Carlyle Dolly MD Electronically signed by Carlyle Dolly MD Signature Date/Time: 04/21/2019/10:21:22 AMThe mitral valve was not well visualized.    Final     Microbiology: No results found for this or any previous visit (from the past 240 hour(s)).   Labs: Basic Metabolic Panel: Recent Labs  Lab 04/20/19 1416 04/20/19 1425 04/21/19 0248 04/22/19 0244 04/23/19 0648  NA 133* 134* 138 140 138  K 5.9* 5.8* 4.8 3.8 3.8  CL 107 108 109 106 106  CO2 13*  --  19* 24 23  GLUCOSE 261* 251* 87 122* 104*  BUN 19 21 21 15 14   CREATININE 1.89* 1.70* 1.51* 1.06 0.92  CALCIUM 8.5*  --  8.6* 9.1 9.1  MG  --   --  1.7 1.5*  --   PHOS  --   --  3.8 3.5  --    Liver Function Tests: No results for input(s): AST, ALT, ALKPHOS, BILITOT, PROT, ALBUMIN in the last 168 hours. No results for input(s): LIPASE, AMYLASE in the last 168 hours. No results for input(s): AMMONIA in the last 168 hours. CBC: Recent Labs  Lab 04/20/19 1416 04/20/19 1425 04/20/19 2123 04/21/19 0248 04/22/19 0244 04/23/19 0305  WBC 9.3  --   --  11.0* 10.4  --   HGB 10.2* 10.5* 11.3* 11.0* 11.3* 12.0*  HCT 32.7* 31.0* 34.4* 33.3* 33.7* 35.6*  MCV 107.6*  --   --  98.8 95.5  --   PLT 245  --   --  190 182  --    Cardiac Enzymes: No results for input(s): CKTOTAL, CKMB, CKMBINDEX, TROPONINI in the last 168 hours. BNP: BNP (last 3 results) Recent Labs    02/25/19 1735  BNP 209.0*     ProBNP (last 3 results) No results for input(s): PROBNP in the last 8760 hours.  CBG: Recent Labs  Lab 04/22/19 2022 04/22/19 2358 04/23/19 0347 04/23/19 0803 04/23/19 1149  GLUCAP 119* 145* 117* 133* 161*       Signed:  Japneet Staggs  [facility] 04/23/2019, 12:29 PM

## 2019-04-23 NOTE — Progress Notes (Signed)
Physical Therapy Treatment Patient Details Name: Darryl Diaz MRN: [id number] DOB: 1940-10-26 Today's Date: 04/23/2019    History of Present Illness 78 y/o M who presented to [facility] on 12/12 after reported fall.  Found to have HR in 20's by EMS.  Reported cardiac arrest in truck with 12 minutes of CPR s/p 3 epi before ROSC.  In ER, patient alert/talking with HR in the 40's post arrest.  Treated in ER with 2 units PRBC and EPI gtt.  Initial Hgb 10.2.     PT Comments    Patient seen for mobility progression. Pt with more steady gait using RW this session and tolerated increased activity well. VSS on RA. Current plan remains appropriate.    Follow Up Recommendations  Home health PT;Supervision - Intermittent     Equipment Recommendations  None recommended by PT    Recommendations for Other Services       Precautions / Restrictions Precautions Precautions: Fall    Mobility  Bed Mobility Overal bed mobility: Modified Independent Bed Mobility: Supine to Sit           General bed mobility comments: use of rail and increased time  Transfers Overall transfer level: Needs assistance   Transfers: Sit to/from Stand Sit to Stand: Min guard         General transfer comment: min guard for safety  Ambulation/Gait Ambulation/Gait assistance: Min guard;Supervision Gait Distance (Feet): 300 Feet Assistive device: Rolling walker (2 wheeled) Gait Pattern/deviations: Step-through pattern     General Gait Details: no physical assist needed with use of AD; good cadence; mild SOB but pt denied need for rest break; VSS on RA   Stairs             Wheelchair Mobility    Modified Rankin (Stroke Patients Only)       Balance Overall balance assessment: Needs assistance   Sitting balance-Leahy Scale: Good       Standing balance-Leahy Scale: Fair                              Cognition Arousal/Alertness: Awake/alert Behavior During Therapy: WFL for tasks  assessed/performed Overall Cognitive Status: Within Functional Limits for tasks assessed                                        Exercises      General Comments        Pertinent Vitals/Pain Pain Assessment: No/denies pain    Home Living                      Prior Function            PT Goals (current goals can now be found in the care plan section) Progress towards PT goals: Progressing toward goals    Frequency    Min 3X/week      PT Plan Current plan remains appropriate    Co-evaluation              AM-PAC PT "6 Clicks" Mobility   Outcome Measure  Help needed turning from your back to your side while in a flat bed without using bedrails?: None Help needed moving from lying on your back to sitting on the side of a flat bed without using bedrails?: None Help needed moving to and from a bed to a  chair (including a wheelchair)?: A Little Help needed standing up from a chair using your arms (e.g., wheelchair or bedside chair)?: A Little Help needed to walk in hospital room?: A Little Help needed climbing 3-5 steps with a railing? : A Lot 6 Click Score: 19    End of Session Equipment Utilized During Treatment: Gait belt Activity Tolerance: Patient tolerated treatment well Patient left: with call bell/phone within reach;in chair Nurse Communication: Mobility status PT Visit Diagnosis: Unsteadiness on feet (R26.81);Difficulty in walking, not elsewhere classified (R26.2)     Time: 3343-5686 PT Time Calculation (min) (ACUTE ONLY): 19 min  Charges:  $Gait Training: 8-22 mins                     Earney Navy, PTA Acute Rehabilitation Services Pager: [phone] Office: [phone]     Darliss Cheney 04/23/2019, 1:41 PM

## 2019-04-23 NOTE — Progress Notes (Signed)
Patient awake with no complaints H.R. drops to 49 S.B. for few seconds then back up S. R. In the 72's [facility]. Aware. Cont. To monitor patient and rhythm.

## 2019-04-23 NOTE — Discharge Instructions (Signed)
Bradycardia, Adult Bradycardia is a slower-than-normal heartbeat. A normal resting heart rate for an adult ranges from 60 to 100 beats per minute. With bradycardia, the resting heart rate is less than 60 beats per minute. Bradycardia can prevent enough oxygen from reaching certain areas of your body when you are active. It can be serious if it keeps enough oxygen from reaching your brain and other parts of your body. Bradycardia is not a problem for everyone. For some healthy adults, a slow resting heart rate is normal. What are the causes? This condition may be caused by:  A problem with the heart, including: ? A problem with the heart's electrical system, such as a heart block. With a heart block, electrical signals between the chambers of the heart are partially or completely blocked, so they are not able to work as they should. ? A problem with the heart's natural pacemaker (sinus node). ? Heart disease. ? A heart attack. ? Heart damage. ? Lyme disease. ? A heart infection. ? A heart condition that is present at birth (congenital heart defect).  Certain medicines that treat heart conditions.  Certain conditions, such as hypothyroidism and obstructive sleep apnea.  Problems with the balance of chemicals and other substances, like potassium, in the blood.  Trauma.  Radiation therapy. What increases the risk? You are more likely to develop this condition if you:  Are age 65 or older.  Have high blood pressure (hypertension), high cholesterol (hyperlipidemia), or diabetes.  Drink heavily, use tobacco or nicotine products, or use drugs. What are the signs or symptoms? Symptoms of this condition include:  Light-headedness.  Feeling faint or fainting.  Fatigue and weakness.  Trouble with activity or exercise.  Shortness of breath.  Chest pain (angina).  Drowsiness.  Confusion.  Dizziness. How is this diagnosed? This condition may be diagnosed based on:  Your  symptoms.  Your medical history.  A physical exam. During the exam, your health care provider will listen to your heartbeat and check your pulse. To confirm the diagnosis, your health care provider may order tests, such as:  Blood tests.  An electrocardiogram (ECG). This test records the heart's electrical activity. The test can show how fast your heart is beating and whether the heartbeat is steady.  A test in which you wear a portable device (event recorder or Holter monitor) to record your heart's electrical activity while you go about your day.  Anexercise test. How is this treated? Treatment for this condition depends on the cause of the condition and how severe your symptoms are. Treatment may involve:  Treatment of the underlying condition.  Changing your medicines or how much medicine you take.  Having a small, battery-operated device called a pacemaker implanted under the skin. When bradycardia occurs, this device can be used to increase your heart rate and help your heart beat in a regular rhythm. Follow these instructions at home: Lifestyle   Manage any health conditions that contribute to bradycardia as told by your health care provider.  Follow a heart-healthy diet. A nutrition specialist (dietitian) can help educate you about healthy food options and changes.  Follow an exercise program that is approved by your health care provider.  Maintain a healthy weight.  Try to reduce or manage your stress, such as with yoga or meditation. If you need help reducing stress, ask your health care provider.  Do not use any products that contain nicotine or tobacco, such as cigarettes, e-cigarettes, and chewing tobacco. If you need help   quitting, ask your health care provider. °· Do not use illegal drugs. °· Limit alcohol intake to no more than 1 drink a day for nonpregnant women and 2 drinks a day for men. Be aware of how much alcohol is in your drink. In the [place] one drink  equals one 12 oz bottle of beer (355 mL), one 5 oz glass of wine (148 mL), or one 1½ oz glass of hard liquor (44 mL). °General instructions °· Take over-the-counter and prescription medicines only as told by your health care provider. °· Keep all follow-up visits as told by your health care provider. This is important. °How is this prevented? °In some cases, bradycardia may be prevented by: °· Treating underlying medical problems. °· Stopping behaviors or medicines that can trigger the condition. °Contact a health care provider if you: °· Feel light-headed or dizzy. °· Almost faint. °· Feel weak or are easily fatigued during physical activity. °· Experience confusion or have memory problems. °Get help right away if: °· You faint. °· You have: °? An irregular heartbeat (palpitations). °? Chest pain. °? Trouble breathing. °Summary °· Bradycardia is a slower-than-normal heartbeat. With bradycardia, the resting heart rate is less than 60 beats per minute. °· Treatment for this condition depends on the cause. °· Manage any health conditions that contribute to bradycardia as told by your health care provider. °· Do not use any products that contain nicotine or tobacco, such as cigarettes, e-cigarettes, and chewing tobacco, and limit alcohol intake. °· Keep all follow-up visits as told by your health care provider. This is important. °This information is not intended to replace advice given to you by your health care provider. Make sure you discuss any questions you have with your health care provider. °Document Released: 01/15/2002 Document Revised: 11/06/2017 Document Reviewed: 10/04/2017 °Elsevier Patient Education © 2020 [employer]vier Inc. ° °

## 2019-04-23 NOTE — Progress Notes (Signed)
Discharge AVS meds take and those due reviewed with pt. Follow up appointments and when to call MD reviewed. All questions and concerns addressed. No further questions at this time. D/c IV and TELE, CCMD notified. D/C home per orders. Pt brought down with all belongings via wheelchair with volunteer.

## 2019-04-23 NOTE — Care Management Important Message (Signed)
Important Message  Patient Details  Name: Darryl Diaz MRN: [id number] Date of Birth: January 25, 1941   Medicare Important Message Given:  Yes     Shelda Altes 04/23/2019, 3:00 PM

## 2019-04-23 NOTE — TOC Transition Note (Signed)
Transition of Care [facility]) - CM/SW Discharge Note Marvetta Gibbons RN, BSN Transitions of Care Unit 4E- RN Case Manager [phone]   Patient Details  Name: Darryl Diaz MRN: [id number] Date of Birth: 1940/12/23  Transition of Care [facility]) CM/SW Contact:  Dawayne Patricia, RN Phone Number: 04/23/2019, 12:48 PM   Clinical Narrative:    Pt stable for transition home, orders placed for HHRN/PT/OT- CM spoke with pt at bedside- list provided to pt Per CMS guidelines from medicare.gov website with star ratings (copy placed in shadow chart)- per pt he is already active with [facility] and would like to continue services with them- pt confirms he has RW at home and all needed DME. Call made to [facility] with Nanine Means and confirmed pt active with them for [facility] needs- Nanine Means is able to continue [facility] services and will resume care for discharge.    Final next level of care: [place] Barriers to Discharge: No Barriers Identified   Patient Goals and CMS Choice Patient states their goals for this hospitalization and ongoing recovery are:: return home CMS Medicare.gov Compare Post Acute Care list provided to:: Patient Choice offered to / list presented to : Patient  Discharge Placement             Home with [facility]          Discharge Plan and Services   Discharge Planning Services: CM Consult Post Acute Care Choice: Home Health, Resumption of Svcs/PTA Provider          DME Arranged: N/A DME Agency: NA       HH Arranged: RN, PT, OT HH Agency: [place] Date [place]: 04/23/19 Time [place]: 1247 Representative spoke with at [place]: [place]) Interventions     Readmission Risk Interventions Readmission Risk Prevention Plan 04/23/2019  Transportation Screening Complete  PCP or Specialist Appt within 5-7 Days Complete  Home Care Screening Complete  Medication Review (RN CM) Complete   Some recent data might be hidden

## 2019-04-24 ENCOUNTER — Other Ambulatory Visit: Payer: Self-pay | Admitting: *Deleted

## 2019-04-24 NOTE — Telephone Encounter (Signed)
Patient contacted regarding discharge from [facility] on 04/23/19.  Patient understands to follow up with provider Kathyrn Drown, NP on 05/07/19 at 11:00 at [facility]. Patient understands discharge instructions? yes Patient understands medications and regiment? yes Patient understands to bring all medications to this visit? yes

## 2019-04-24 NOTE — Patient Outreach (Signed)
[facility]) Care Management  04/24/2019  Darryl Diaz 1940/05/30 [id number]    Telephone Assessment-Successful-HF  Transition of care completed by the provider's office.  RN spoke with today and received update on pt's ongoing management of care. Discuss pt's recent discharge from the hospital and verified pt has had a virtual visit with his primary care provider today and has all medications taking as recommended. Pt indicated all his questions and inquired were addressed on the call to his provider with no further inquires. Rn inquired on any immediate needs as pt declined. States the [facility] PT involved prior to his recent hospitalization will visit tomorrow for reassessment. RN discussed the current plan of care and inquired on pt's daily weights. Pt states he was discharged on yesterday and will began his routine with daily weights tomorrow morning. Pt declines any swelling or related symptoms at this time verifying he is in the GREEN zone. Pt able to recite what to do if acute symptoms are encountered. Pt is managing his HF and following all recommendations post-op hospitalization. Goals of care updated with interventions based upon pt's recent hospitalization and will be re-evaluated on the next follow up call. Will continue to update provider's office quarterly on pt's progress.  [facility] CM Care Plan Problem One     Most Recent Value  Care Plan Problem One  Knowledge Deficit related to [place] plan  Role Documenting the Problem One  Care Management Telephonic Coordinator  Care Plan for Problem One  Active  THN Long Term Goal   Pt will verbalize the plan of action in the GREEN zone within the next 90 days.  THN Long Term Goal Start Date  04/08/19  Interventions for Problem One Long Term Goal  Will extend this goal to allow ongoing adherence due to pt's recent hospitalization. Will stress the importance of daily monitoring with weights and to recognize potential symptoms  that may lead to distress. Will verify pt remains in the GREEN zone   THN CM Short Term Goal #1   Pt will verbalize maintaining weight within 2 pounds of goal within the next 30 days.  THN CM Short Term Goal #1 Start Date  04/08/19  Interventions for Short Term Goal #1  Will extend to allow adherence with daily weights as pt continue to recover. Will continue to stress the improtance of reporting any related symptoms with fluid retention or difficulty with breathing.   THN CM Short Term Goal #2   Pt will adhere to all medical appointments  THN CM Short Term Goal #2 Start Date  04/08/19  Interventions for Short Term Goal #2  Will extend to allow pt to recover from his recent hospitalization. Will continue to encouraged attendance to all pending medical appointments and verify sufficient transportation if needed.      [name], RN Care Management Coordinator [place] Office [phone]

## 2019-04-25 DIAGNOSIS — N183 Chronic kidney disease, stage 3 unspecified: Secondary | ICD-10-CM | POA: Diagnosis not present

## 2019-04-25 DIAGNOSIS — I5033 Acute on chronic diastolic (congestive) heart failure: Secondary | ICD-10-CM | POA: Diagnosis not present

## 2019-04-25 DIAGNOSIS — K2100 Gastro-esophageal reflux disease with esophagitis, without bleeding: Secondary | ICD-10-CM | POA: Diagnosis not present

## 2019-04-25 DIAGNOSIS — I130 Hypertensive heart and chronic kidney disease with heart failure and stage 1 through stage 4 chronic kidney disease, or unspecified chronic kidney disease: Secondary | ICD-10-CM | POA: Diagnosis not present

## 2019-04-25 DIAGNOSIS — K2900 Acute gastritis without bleeding: Secondary | ICD-10-CM | POA: Diagnosis not present

## 2019-04-25 DIAGNOSIS — D62 Acute posthemorrhagic anemia: Secondary | ICD-10-CM | POA: Diagnosis not present

## 2019-04-30 DIAGNOSIS — I130 Hypertensive heart and chronic kidney disease with heart failure and stage 1 through stage 4 chronic kidney disease, or unspecified chronic kidney disease: Secondary | ICD-10-CM | POA: Diagnosis not present

## 2019-04-30 DIAGNOSIS — N183 Chronic kidney disease, stage 3 unspecified: Secondary | ICD-10-CM | POA: Diagnosis not present

## 2019-04-30 DIAGNOSIS — K2100 Gastro-esophageal reflux disease with esophagitis, without bleeding: Secondary | ICD-10-CM | POA: Diagnosis not present

## 2019-04-30 DIAGNOSIS — D62 Acute posthemorrhagic anemia: Secondary | ICD-10-CM | POA: Diagnosis not present

## 2019-04-30 DIAGNOSIS — K2900 Acute gastritis without bleeding: Secondary | ICD-10-CM | POA: Diagnosis not present

## 2019-04-30 DIAGNOSIS — I5033 Acute on chronic diastolic (congestive) heart failure: Secondary | ICD-10-CM | POA: Diagnosis not present

## 2019-05-01 DIAGNOSIS — N183 Chronic kidney disease, stage 3 unspecified: Secondary | ICD-10-CM | POA: Diagnosis not present

## 2019-05-01 DIAGNOSIS — I130 Hypertensive heart and chronic kidney disease with heart failure and stage 1 through stage 4 chronic kidney disease, or unspecified chronic kidney disease: Secondary | ICD-10-CM | POA: Diagnosis not present

## 2019-05-01 DIAGNOSIS — K2100 Gastro-esophageal reflux disease with esophagitis, without bleeding: Secondary | ICD-10-CM | POA: Diagnosis not present

## 2019-05-01 DIAGNOSIS — D62 Acute posthemorrhagic anemia: Secondary | ICD-10-CM | POA: Diagnosis not present

## 2019-05-01 DIAGNOSIS — K2900 Acute gastritis without bleeding: Secondary | ICD-10-CM | POA: Diagnosis not present

## 2019-05-01 DIAGNOSIS — I5033 Acute on chronic diastolic (congestive) heart failure: Secondary | ICD-10-CM | POA: Diagnosis not present

## 2019-05-02 NOTE — Progress Notes (Signed)
Cardiology Office Note   Date:  05/07/2019   ID:  Darryl Diaz, DOB 09/06/1940, MRN [id number]  PCP:  [name], MD  Cardiologist: Dr. Burt Knack, MD  Chief Complaint  Patient presents with  . Hospitalization Follow-up    History of Present Illness: Darryl Diaz is a 78 y.o. male who presents for hospital follow-up, seen for Dr. Burt Knack  Darryl Diaz has a history of paroxysmal atrial fibrillation on Eliquis, hyperlipidemia, hypertension, chronic diastolic heart failure, previous alcohol abuse-quit in 2017, GERD, depression and recent GI bleed who was most recently seen in hospital consultation for cardiac arrest/bradycardia/chest pain.  He was last admitted to the hospital 02/2019 for GI bleed and fall at which time his hemoglobin was noted to be 6.9 and required transfusions.  EGD showed Mallory-Weiss tear and erosions/erythema in his stomach.  Concern for EtOH related gastritis.  Hospital course was complicated by atrial fibrillation RVR treated with IV amiodarone with transition to oral amiodarone and home diltiazem CD as well as metoprolol.  He was deemed stable to resume anticoagulation 03/01/2019 with increased PPI dosing.  He was doing well at post hospital follow-up 03/29/2019.  Unfortunately, patient reported feeling tired for 2 weeks prior to hospitalization.  Due to persistent symptoms, he called EMS 04/20/2019 at which time he was noted to be bradycardic with a heart rate in the 20s.  In route, patient was found to have heart rate in the 20s by EMS with question if this was a true arrest or altered level of consciousness given bradycardia with poor perfusion possibly secondary to accidental medication overdose. He required CPR for 12 minutes and 3 rounds of epi before return to spontaneous circulation.  Upon arrival to the ED his heart rate improved to the 40s.  He was placed on atropine with improvement in heart rate and maintained NSR with rates in the 80s to 90s.   Home amiodarone, Cardizem and Toprol were all held on presentation.  There were some reports of increased chest pain.  EKG without acute changes.  CXR with significant improvement in interstitial opacities consistent with resolved edema.  Per chart review, if A. 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  Echocardiogram performed with an LVEF of 60 to 65% with left atrial dilation and tricuspid regurgitation  Today he presents for hospital follow-up and is doing very well from a CV standpoint.  He denies recurrent symptoms since hospital discharge on 04/24/2019.  He continues to have anterior chest soreness from CPR which is relieved with Tylenol.  Has been seen by his PCP with instructions to continue previous medications with the exception of the above. Stable lab work at that time per patient report. He shows me his AVS from d/c and states he was unaware that he was to take Prednisone taper therefore he has not.  BP is stable today at 132/70 however heart rate is 97 bpm, NSR on palpation. We discussed possible reinitiation of low-dose beta-blocker therapy however will need to further discuss with primary cardiologist, Dr. Burt Knack later today. Does not monitor his BP or heart rate at home, however plans to obtain one this week. Has some other chronic orthopedic complaitns but overall seems to be doing well. Denies anginal symptoms, shortness of breath, LE swelling, orthopnea, dizziness, presyncopal or syncopal episodes.   Past Medical History:  Diagnosis Date  . 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 Atrial fibrillation ([place])   . Fall at home 03/28/2016  . GERD (gastroesophageal reflux disease)   . H/O seasonal allergies   . Hx of adenomatous colonic polyps 08/10/2017  . Hyperlipidemia   . Hypertension   . Insomnia   . 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 OA (osteoarthritis)    hands    Past Surgical History:  Procedure Laterality Date  . ANKLE SURGERY Left 2014   had rods put in   . BIOPSY  02/28/2019   Procedure: BIOPSY;  Surgeon: Milus Banister, MD;  Location: [facility] ENDOSCOPY;  Service: Endoscopy;;  . [place]  . ESOPHAGOGASTRODUODENOSCOPY (EGD) WITH PROPOFOL N/A 02/28/2019   Procedure: ESOPHAGOGASTRODUODENOSCOPY (EGD) WITH PROPOFOL;  Surgeon: Milus Banister, MD;  Location: [facility] ENDOSCOPY;  Service: Endoscopy;  Laterality: N/A;  . HIP ARTHROPLASTY Left 04/01/2016   Procedure: ARTHROPLASTY BIPOLAR HIP (HEMIARTHROPLASTY);  Surgeon: Paralee Cancel, MD;  Location: [facility];  Service: Orthopedics;  Laterality: Left;  . HIP CLOSED REDUCTION Left 04/30/2016   Procedure: CLOSED REDUCTION HIP;  Surgeon: Wylene Simmer, MD;  Location: [facility];  Service: Orthopedics;  Laterality: Left;  . TONSILLECTOMY       Current Outpatient Medications  Medication Sig Dispense Refill  . acetaminophen (TYLENOL) 500 MG tablet Take 1,000 mg by mouth every 6 (six) hours as needed for headache (pain).    Marland Kitchen apixaban (ELIQUIS) 5 MG TABS tablet Take 1 tablet (5 mg total) by mouth 2 (two) times daily.    Marland Kitchen atorvastatin (LIPITOR) 20 MG tablet Take 20 mg by mouth daily.    Marland Kitchen escitalopram (LEXAPRO) 10 MG tablet Take 10 mg by mouth daily.    . ferrous sulfate 325 (65 FE) MG EC tablet Take 325 mg by mouth daily.     . folic acid (FOLVITE) 1 MG tablet Take 1 mg by mouth daily.    . furosemide (LASIX) 40 MG tablet Take 40 mg by mouth daily as needed for fluid (weight gain).    Marland Kitchen loratadine (CLARITIN) 10 MG tablet Take 10 mg by mouth daily.    . Multiple Vitamin (MULTIVITAMIN WITH MINERALS) TABS tablet Take 1 tablet by mouth daily.    . pantoprazole (PROTONIX) 40 MG tablet Take 40 mg by mouth daily.    . potassium chloride SA (KLOR-CON) 20 MEQ tablet Take 2 tablets (40 mEq total) by mouth daily. 90 tablet 3  . predniSONE (DELTASONE)  10 MG tablet Take 4 tablets (40 mg total) by mouth daily with breakfast. Take 4 tabs x 1 day, then 3 tabs x 1 day, then 2 tabs x 1 day, then 1 tab x 1 day 10 tablet 0  . tamsulosin (FLOMAX) 0.4 MG CAPS capsule Take 0.4 mg by mouth at bedtime.     . Thiamine HCl (VITAMIN B-1) 250 MG tablet Take 250 mg by mouth daily.    . traZODone (DESYREL) 50 MG tablet Take 50 mg by mouth at bedtime.     No current facility-administered medications for this visit.   Facility-Administered Medications Ordered in Other Visits  Medication Dose Route Frequency Provider Last Rate Last Admin  . metoprolol tartrate (LOPRESSOR) injection    Anesthesia Intra-op Mariea Clonts, CRNA   5 mg at 02/27/19 1318    Allergies:   Penicillins    Social History:  The patient  reports that he has quit smoking. He has never used smokeless tobacco.  He reports that he does not drink alcohol or use drugs.   Family History:  The patient's family history includes Arthritis in his mother; Heart disease (age of onset: 69) in his mother; Heart failure in his sister; Heart failure (age of onset: 71) in his father; Hypertension in his mother; Stroke in his maternal aunt.    ROS:  Please see the history of present illness. Otherwise, review of systems are positive for none.   All other systems are reviewed and negative.    PHYSICAL EXAM: VS:  BP 132/70   Pulse 97   Ht 6' (1.829 m)   Wt 251 lb 6.4 oz (114 kg)   SpO2 100%   BMI 34.10 kg/m  , BMI Body mass index is 34.1 kg/m.   General: Obese, NAD Neck: Negative for carotid bruits. No JVD Lungs:Clear to ausculation bilaterally. No wheezes, rales, or rhonchi. Breathing is unlabored. Cardiovascular: RRR with S1 S2. Extremities: No edema.  PT pulses 1+ bilaterally Neuro: Alert and oriented. No focal deficits. No facial asymmetry. MAE spontaneously. Psych: Responds to questions appropriately with normal affect.    EKG:  EKG is not ordered today.  Recent Labs: 02/25/2019: B  Natriuretic Peptide 209.0 03/05/2019: ALT 50 04/22/2019: Magnesium 1.5; Platelets 182; TSH 3.727 04/23/2019: BUN 14; Creatinine, Ser 0.92; Hemoglobin 12.0; Potassium 3.8; Sodium 138    Lipid Panel    Component Value Date/Time   CHOL 81 02/28/2019 0314   TRIG 27 02/28/2019 0314   HDL 47 02/28/2019 0314   CHOLHDL 1.7 02/28/2019 0314   VLDL 5 02/28/2019 0314   LDLCALC 29 02/28/2019 0314     Wt Readings from Last 3 Encounters:  05/07/19 251 lb 6.4 oz (114 kg)  04/23/19 243 lb 6.2 oz (110.4 kg)  03/19/19 250 lb 9.6 oz (113.7 kg)    Other studies Reviewed: Additional studies/ records that were reviewed today include:   Echocardiogram 04/21/2019:   1. The left ventricle has no regional wall motion abnormalities.  2. LVEF is 60-65%.  3. Left atrial size was severely dilated.  4. The mitral valve was not assessed. not assessed mitral valve regurgitation. No evidence of mitral stenosis.  5. The tricuspid valve is normal in structure. Tricuspid valve regurgitation is trivial.  6. The pulmonic valve was not well visualized. Pulmonic valve regurgitation not assessed.  7. Limited echo s/p cardiac arrest  8. Not assessed.  9. The aortic valve was not assessed. Aortic valve regurgitation not assessed. 10. Left ventricular ejection fraction, by visual estimation, is 60 to 65%. The left ventricle has normal function. There is not assseded. 11. Global right ventricle has normal systolic function.The right ventricular size is not assessed. Right vetricular wall thickness was not assessed.  ASSESSMENT AND PLAN:  1.  Symptomatic bradycardia: -Diltiazem, metoprolol and amiodarone have been held since hospital consultation 04/22/2019>>continue for now  -HR today is elevated at 91bpm (NSR)>>may need re-initiation of low dose BB however will discuss case with primary cardiologist for final decision  -May need EP referral for possible PPM placement given tacky/bradycardia syndrome with bradycardic  arrest   2.  Chest pain: -Likely in the setting of CPR, musculoskeletal>>releived with Tylenol -Reproducible on palpation   3.  Anemia from prior GI bleed: -Hemoglobin from previous hospitalization 6.9 requiring transfusions however stable at discharge 03/05/2019 10.2. Eliquis was restarted during that admission without further bleeding>>stable per patient report at PCP OV -Conitnue Eliquis with high-dose PPI  4. Acute hypoxic respiratory failure: -PCCM consulted during hospitalization due  to low SpO2 levels with  suspected underlying COPD>>placed on steroid taper however patient has not been taking  -Resolved by d/c with SpO2 today at 99% on RA   5. Hypertension:  -BP stable, 132/70  6. Hyperlipidemia -Last LDL, 29 -On statin   7. Paroxysmal atrial fibrillation:  -Currently in sinus rhythm with HR 97bpm  -As above home medications including Cardizem, metoprolol and amiodarone have been held secondary to severe bradycardia event  -Continue Elquis, PPI    Current medicines are reviewed at length with the patient today.  The patient does not have concerns regarding medicines.  The following changes have been made:  no change  Labs/ tests ordered today include: None  No orders of the defined types were placed in this encounter.    Disposition:   FU with APP in 2 months  Signed, Kathyrn Drown, NP  05/07/2019 12:00 PM    [address], [place], [place]  [address] Phone: [phone]; Fax: [phone]

## 2019-05-07 ENCOUNTER — Other Ambulatory Visit: Payer: Self-pay

## 2019-05-07 ENCOUNTER — Encounter: Payer: Self-pay | Admitting: Cardiology

## 2019-05-07 ENCOUNTER — Ambulatory Visit (INDEPENDENT_AMBULATORY_CARE_PROVIDER_SITE_OTHER): Payer: Medicare Other | Admitting: Cardiology

## 2019-05-07 VITALS — BP 132/70 | HR 97 | Ht 72.0 in | Wt 251.4 lb

## 2019-05-07 DIAGNOSIS — I10 Essential (primary) hypertension: Secondary | ICD-10-CM | POA: Diagnosis not present

## 2019-05-07 DIAGNOSIS — E7849 Other hyperlipidemia: Secondary | ICD-10-CM

## 2019-05-07 DIAGNOSIS — I480 Paroxysmal atrial fibrillation: Secondary | ICD-10-CM | POA: Diagnosis not present

## 2019-05-07 DIAGNOSIS — R001 Bradycardia, unspecified: Secondary | ICD-10-CM

## 2019-05-07 DIAGNOSIS — D62 Acute posthemorrhagic anemia: Secondary | ICD-10-CM

## 2019-05-07 MED ORDER — METOPROLOL SUCCINATE ER 25 MG PO TB24: 25.0000 mg | ORAL_TABLET | Freq: Every day | ORAL | 3 refills | Status: DC

## 2019-05-07 NOTE — Patient Instructions (Signed)
Medication Instructions:   Your physician recommends that you continue on your current medications as directed. Please refer to the Current Medication list given to you today.  *If you need a refill on your cardiac medications before your next appointment, please call your pharmacy*  Lab Work:  None ordered today  If you have labs (blood work) drawn today and your tests are completely normal, you will receive your results only by: Marland Kitchen MyChart Message (if you have MyChart) OR . A paper copy in the mail If you have any lab test that is abnormal or we need to change your treatment, we will call you to review the results.  Testing/Procedures:  None ordered today  Follow-Up: At [facility], you and your health needs are our priority.  As part of our continuing mission to provide you with exceptional heart care, we have created designated Provider Care Teams.  These Care Teams include your primary Cardiologist (physician) and Advanced Practice Providers (APPs -  Physician Assistants and Nurse Practitioners) who all work together to provide you with the care you need, when you need it.  Your next appointment:    On 06/13/19 at 1:45PM with Kathyrn Drown, NP  Other Instructions  We will be calling you this afternoon after Sharee Pimple speaks with Dr. Burt Knack

## 2019-05-07 NOTE — Addendum Note (Signed)
Addended by: Mady Haagensen on: 05/07/2019 05:30 PM   Modules accepted: Orders

## 2019-05-08 ENCOUNTER — Ambulatory Visit: Payer: Self-pay | Admitting: *Deleted

## 2019-05-08 DIAGNOSIS — D62 Acute posthemorrhagic anemia: Secondary | ICD-10-CM | POA: Diagnosis not present

## 2019-05-08 DIAGNOSIS — K2100 Gastro-esophageal reflux disease with esophagitis, without bleeding: Secondary | ICD-10-CM | POA: Diagnosis not present

## 2019-05-08 DIAGNOSIS — N183 Chronic kidney disease, stage 3 unspecified: Secondary | ICD-10-CM | POA: Diagnosis not present

## 2019-05-08 DIAGNOSIS — I5033 Acute on chronic diastolic (congestive) heart failure: Secondary | ICD-10-CM | POA: Diagnosis not present

## 2019-05-08 DIAGNOSIS — I130 Hypertensive heart and chronic kidney disease with heart failure and stage 1 through stage 4 chronic kidney disease, or unspecified chronic kidney disease: Secondary | ICD-10-CM | POA: Diagnosis not present

## 2019-05-08 DIAGNOSIS — K2900 Acute gastritis without bleeding: Secondary | ICD-10-CM | POA: Diagnosis not present

## 2019-05-12 DIAGNOSIS — Z7901 Long term (current) use of anticoagulants: Secondary | ICD-10-CM | POA: Diagnosis not present

## 2019-05-12 DIAGNOSIS — F1011 Alcohol abuse, in remission: Secondary | ICD-10-CM | POA: Diagnosis not present

## 2019-05-12 DIAGNOSIS — I5033 Acute on chronic diastolic (congestive) heart failure: Secondary | ICD-10-CM | POA: Diagnosis not present

## 2019-05-12 DIAGNOSIS — M1990 Unspecified osteoarthritis, unspecified site: Secondary | ICD-10-CM | POA: Diagnosis not present

## 2019-05-12 DIAGNOSIS — J9601 Acute respiratory failure with hypoxia: Secondary | ICD-10-CM | POA: Diagnosis not present

## 2019-05-12 DIAGNOSIS — I480 Paroxysmal atrial fibrillation: Secondary | ICD-10-CM | POA: Diagnosis not present

## 2019-05-12 DIAGNOSIS — D62 Acute posthemorrhagic anemia: Secondary | ICD-10-CM | POA: Diagnosis not present

## 2019-05-12 DIAGNOSIS — N183 Chronic kidney disease, stage 3 unspecified: Secondary | ICD-10-CM | POA: Diagnosis not present

## 2019-05-12 DIAGNOSIS — F329 Major depressive disorder, single episode, unspecified: Secondary | ICD-10-CM | POA: Diagnosis not present

## 2019-05-12 DIAGNOSIS — E785 Hyperlipidemia, unspecified: Secondary | ICD-10-CM | POA: Diagnosis not present

## 2019-05-12 DIAGNOSIS — Z9181 History of falling: Secondary | ICD-10-CM | POA: Diagnosis not present

## 2019-05-12 DIAGNOSIS — Z8674 Personal history of sudden cardiac arrest: Secondary | ICD-10-CM | POA: Diagnosis not present

## 2019-05-12 DIAGNOSIS — Z87891 Personal history of nicotine dependence: Secondary | ICD-10-CM | POA: Diagnosis not present

## 2019-05-12 DIAGNOSIS — I130 Hypertensive heart and chronic kidney disease with heart failure and stage 1 through stage 4 chronic kidney disease, or unspecified chronic kidney disease: Secondary | ICD-10-CM | POA: Diagnosis not present

## 2019-05-12 DIAGNOSIS — K2900 Acute gastritis without bleeding: Secondary | ICD-10-CM | POA: Diagnosis not present

## 2019-05-12 DIAGNOSIS — R001 Bradycardia, unspecified: Secondary | ICD-10-CM | POA: Diagnosis not present

## 2019-05-12 DIAGNOSIS — R531 Weakness: Secondary | ICD-10-CM | POA: Diagnosis not present

## 2019-05-13 ENCOUNTER — Telehealth: Payer: Self-pay | Admitting: Cardiology

## 2019-05-13 NOTE — Telephone Encounter (Signed)
Pt seen in office last week, he is currently holding Metoprolol, Diltiazem and Amiodarone as instructed post hospital d/c.  He received a refill of Metoprolol from his pharmacy ans is wondering if he should restart.  Per OV notes Kathyrn Drown was going to check ith Dr. Burt Knack about whether to restart.  Pt indicates his HR and BP have been stable.  Advised I would check to see if a decision has been made on Metoprolol and let pt know.

## 2019-05-13 NOTE — Telephone Encounter (Signed)
New Message     Pt c/o medication issue:  1. Name of Medication: metoprolol succinate (TOPROL XL) 25 MG 24 hr tablet  2. How are you currently taking this medication (dosage and times per day)? Not currently taking  3. Are you having a reaction (difficulty breathing--STAT)? No    4. What is your medication issue? Pt is calling and says he was taken off of this medication but when he went to the pharmacy, he picked it up and is wondering if he is suppose to take it or not    Please call

## 2019-05-14 DIAGNOSIS — K2900 Acute gastritis without bleeding: Secondary | ICD-10-CM | POA: Diagnosis not present

## 2019-05-14 DIAGNOSIS — Z8674 Personal history of sudden cardiac arrest: Secondary | ICD-10-CM | POA: Diagnosis not present

## 2019-05-14 DIAGNOSIS — D62 Acute posthemorrhagic anemia: Secondary | ICD-10-CM | POA: Diagnosis not present

## 2019-05-14 DIAGNOSIS — I130 Hypertensive heart and chronic kidney disease with heart failure and stage 1 through stage 4 chronic kidney disease, or unspecified chronic kidney disease: Secondary | ICD-10-CM | POA: Diagnosis not present

## 2019-05-14 DIAGNOSIS — J9601 Acute respiratory failure with hypoxia: Secondary | ICD-10-CM | POA: Diagnosis not present

## 2019-05-14 DIAGNOSIS — R001 Bradycardia, unspecified: Secondary | ICD-10-CM | POA: Diagnosis not present

## 2019-05-14 NOTE — Telephone Encounter (Signed)
Spoke with pt, advised of recommendation to restart Metoprolol 25mg  daily.  Confirmed pt has appt at end of January with EP/ Dr. Caryl Comes.

## 2019-05-16 DIAGNOSIS — K2900 Acute gastritis without bleeding: Secondary | ICD-10-CM | POA: Diagnosis not present

## 2019-05-16 DIAGNOSIS — R001 Bradycardia, unspecified: Secondary | ICD-10-CM | POA: Diagnosis not present

## 2019-05-16 DIAGNOSIS — J9601 Acute respiratory failure with hypoxia: Secondary | ICD-10-CM | POA: Diagnosis not present

## 2019-05-16 DIAGNOSIS — D62 Acute posthemorrhagic anemia: Secondary | ICD-10-CM | POA: Diagnosis not present

## 2019-05-16 DIAGNOSIS — I130 Hypertensive heart and chronic kidney disease with heart failure and stage 1 through stage 4 chronic kidney disease, or unspecified chronic kidney disease: Secondary | ICD-10-CM | POA: Diagnosis not present

## 2019-05-16 DIAGNOSIS — Z8674 Personal history of sudden cardiac arrest: Secondary | ICD-10-CM | POA: Diagnosis not present

## 2019-05-20 DIAGNOSIS — J9601 Acute respiratory failure with hypoxia: Secondary | ICD-10-CM | POA: Diagnosis not present

## 2019-05-20 DIAGNOSIS — D62 Acute posthemorrhagic anemia: Secondary | ICD-10-CM | POA: Diagnosis not present

## 2019-05-20 DIAGNOSIS — R001 Bradycardia, unspecified: Secondary | ICD-10-CM | POA: Diagnosis not present

## 2019-05-20 DIAGNOSIS — K2900 Acute gastritis without bleeding: Secondary | ICD-10-CM | POA: Diagnosis not present

## 2019-05-20 DIAGNOSIS — I130 Hypertensive heart and chronic kidney disease with heart failure and stage 1 through stage 4 chronic kidney disease, or unspecified chronic kidney disease: Secondary | ICD-10-CM | POA: Diagnosis not present

## 2019-05-20 DIAGNOSIS — Z8674 Personal history of sudden cardiac arrest: Secondary | ICD-10-CM | POA: Diagnosis not present

## 2019-05-23 ENCOUNTER — Telehealth: Payer: Self-pay | Admitting: Internal Medicine

## 2019-05-23 ENCOUNTER — Telehealth (INDEPENDENT_AMBULATORY_CARE_PROVIDER_SITE_OTHER): Payer: Medicare Other | Admitting: Internal Medicine

## 2019-05-23 ENCOUNTER — Other Ambulatory Visit: Payer: Self-pay

## 2019-05-23 VITALS — Ht 72.0 in | Wt 249.0 lb

## 2019-05-23 DIAGNOSIS — I480 Paroxysmal atrial fibrillation: Secondary | ICD-10-CM | POA: Diagnosis not present

## 2019-05-23 DIAGNOSIS — R001 Bradycardia, unspecified: Secondary | ICD-10-CM | POA: Diagnosis not present

## 2019-05-23 MED ORDER — METOPROLOL SUCCINATE ER 25 MG PO TB24: 50.0000 mg | ORAL_TABLET | Freq: Every day | ORAL | 3 refills | Status: DC

## 2019-05-23 NOTE — Patient Instructions (Signed)
Phone call to pt and reviewed Dr Olin Pia instructions with pt.  Pt advised to increase Metoprolol Succinate from 25 mg daily by mouth to 50mg  daily by mouth.  Appointment scheduled for 08/12/2019 at 945am.  Pt states according to med list from [facility] dated 03/19/2019 pt was taking Amiodarone 200mg  and Diltiazem 360mg .  Advised pt will pass this information along to Dr Caryl Comes as well.  Pt verbalizes understanding of instructions and agrees with plan.

## 2019-05-23 NOTE — Telephone Encounter (Signed)
Pt calling to follow up with more information from his earlier conversation with Dr. Caryl Comes during his virtual visit this afternoon.   The patient wanted to make Dr. Caryl Comes aware that he thinks his cardiac issues in December were caused by him eating grapefruit as it reacts with his cardiac medications.   Pt made aware to call back with any other questions or concerns.

## 2019-05-23 NOTE — Progress Notes (Signed)
Electrophysiology TeleHealth Note   Due to national recommendations of social distancing due to COVID 19, an audio/video telehealth visit is felt to be most appropriate for this patient at this time.  See MyChart message from today for the patient's consent to telehealth for Darryl Diaz.   Date:  05/23/2019   ID:  Darryl Diaz, DOB 1940-11-26, MRN [id number]  Location: patient's home  Provider location: [address] [place]  Evaluation Performed: Follow-up visit  PCP:  Diaz, Darryl Him, MD  Cardiologist:   [facility]* Electrophysiologist:  Darryl Diaz   Chief Complaint:  Bradycardia and syncope  History of Present Illness:    Darryl Diaz is a 79 y.o. male who presents via audio/video conferencing for a telehealth visit today.  He is referred to the  clinic for bradycardia and syncope with hx of PAFib  the patient reports doing well  Initially followed at [facility] and treated with apixoban; did not recall symptoms with atrial fibrillation   Hx of recurrent GI bleeds, 10/20 with MW tear and course cx by AFib RVR, Rx with amio and CCB ( dilt 360) and resumption of anticoagulation; had previously been on metoprolol 200 mg daily  Syncope while talking to his sister Hospitalized 12/20 with weakness and HR in 20>> underwent CPR ,K 5.9--Given atropine, Ca,Insulin D50, and epi drip-- reviewed ED notes  NaHCO3 Amio BB (metoprolol 200)  and CCB(dilt 360)  Held  DATE TEST EF   12/20 Echo   65 % LAE ( 57.59ml/m2)         Thromboembolic risk factors ( age -1 *, HTN- 1) for a CHADSVASc Score of 3 The patient denies symptoms of fevers, chills, cough, or new SOB worrisome for COVID 19.    Past Medical History:  Diagnosis Date  . 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 Atrial fibrillation ([place])   . Fall at home 03/28/2016  . GERD (gastroesophageal reflux disease)   . H/O seasonal allergies   . Hx of adenomatous colonic polyps 08/10/2017  . Hyperlipidemia   .  Hypertension   . Insomnia   . 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 OA (osteoarthritis)    hands    Past Surgical History:  Procedure Laterality Date  . ANKLE SURGERY Left 2014   had rods put in   . BIOPSY  02/28/2019   Procedure: BIOPSY;  Surgeon: Darryl Banister, MD;  Location: [facility] ENDOSCOPY;  Service: Endoscopy;;  . [place]  . ESOPHAGOGASTRODUODENOSCOPY (EGD) WITH PROPOFOL N/A 02/28/2019   Procedure: ESOPHAGOGASTRODUODENOSCOPY (EGD) WITH PROPOFOL;  Surgeon: Darryl Banister, MD;  Location[facility] ENDOSCOPY;  Service: Endoscopy;  Laterality: N/A;  . HIP ARTHROPLASTY Left 04/01/2016   Procedure: ARTHROPLASTY BIPOLAR HIP (HEMIARTHROPLASTY);  Surgeon: Darryl Cancel, MD;  Location: [place];  Service: Orthopedics;  Laterality: Left;  . HIP CLOSED REDUCTION Left 04/30/2016   Procedure: CLOSED REDUCTION HIP;  Surgeon: Darryl Simmer, MD;  Location: [place];  Service: Orthopedics;  Laterality: Left;  . TONSILLECTOMY      Current Outpatient Medications  Medication Sig Dispense Refill  . acetaminophen (TYLENOL) 500 MG tablet Take 1,000 mg by mouth every 6 (six) hours as needed for headache (pain).    Marland Kitchen apixaban (ELIQUIS) 5 MG TABS tablet Take 1 tablet (5 mg total) by mouth 2 (two) times daily.    Marland Kitchen atorvastatin (LIPITOR) 20 MG tablet Take 20 mg by mouth daily.    Marland Kitchen  escitalopram (LEXAPRO) 10 MG tablet Take 10 mg by mouth daily.    . ferrous sulfate 325 (65 FE) MG EC tablet Take 325 mg by mouth daily.     . folic acid (FOLVITE) 1 MG tablet Take 1 mg by mouth daily.    . furosemide (LASIX) 40 MG tablet Take 40 mg by mouth daily as needed for fluid (weight gain).    Marland Kitchen loratadine (CLARITIN) 10 MG tablet Take 10 mg by mouth daily.    . metoprolol succinate (TOPROL XL) 25 MG 24 hr tablet Take 1 tablet (25 mg total) by mouth daily. 90 tablet 3  . Multiple Vitamin (MULTIVITAMIN WITH MINERALS) TABS tablet Take 1 tablet by mouth daily.    . pantoprazole (PROTONIX) 40 MG  tablet Take 40 mg by mouth daily.    . potassium chloride SA (KLOR-CON) 20 MEQ tablet Take 2 tablets (40 mEq total) by mouth daily. 90 tablet 3  . tamsulosin (FLOMAX) 0.4 MG CAPS capsule Take 0.4 mg by mouth at bedtime.     . Thiamine HCl (VITAMIN B-1) 250 MG tablet Take 250 mg by mouth daily.    . traZODone (DESYREL) 50 MG tablet Take 50 mg by mouth at bedtime.    . predniSONE (DELTASONE) 10 MG tablet Take 4 tablets (40 mg total) by mouth daily with breakfast. Take 4 tabs x 1 day, then 3 tabs x 1 day, then 2 tabs x 1 day, then 1 tab x 1 day (Patient not taking: Reported on 05/23/2019) 10 tablet 0   No current facility-administered medications for this visit.   Facility-Administered Medications Ordered in Other Visits  Medication Dose Route Frequency Provider Last Rate Last Admin  . metoprolol tartrate (LOPRESSOR) injection    Anesthesia Intra-op Darryl Clonts, CRNA   5 mg at 02/27/19 1318    Allergies:   Penicillins   Social History:  The patient  reports that he has quit smoking. He has never used smokeless tobacco. He reports that he does not drink alcohol or use drugs.   Family History:  The patient's   family history includes Arthritis in his mother; Heart disease (age of onset: 64) in his mother; Heart failure in his sister; Heart failure (age of onset: 49) in his father; Hypertension in his mother; Stroke in his maternal aunt.   ROS:  Please see the history of present illness.   All other systems are personally reviewed and negative.    Exam:    Vital Signs:  Ht 6' (1.829 m)   Wt 249 lb (112.9 kg)   BMI 33.77 kg/m     Labs/Other Tests and Data Reviewed:    Recent Labs: 02/25/2019: B Natriuretic Peptide 209.0 03/05/2019: ALT 50 04/22/2019: Magnesium 1.5; Platelets 182; TSH 3.727 04/23/2019: BUN 14; Creatinine, Ser 0.92; Hemoglobin 12.0; Potassium 3.8; Sodium 138   Wt Readings from Last 3 Encounters:  05/23/19 249 lb (112.9 kg)  05/07/19 251 lb 6.4 oz (114 kg)    04/23/19 243 lb 6.2 oz (110.4 kg)     Other studies personally reviewed: Additional studies/ records that were reviewed today include: As above   Review of the above records today demonstrates:  2  Prior radiographs:    ECG Personally reviewed  02/27/19 >> AFib 130 03/20/19  ECG personally reviewed NSR 84 04/20/19 ECG personally reviewed Sinus ( 12 bpm) with junction escape about 30  12/13 ECG personally reviewed Sinus @ 89  ASSESSMENT & PLAN:    ATRIAL FIB PAROXYSMAL-RVR  (  in context of GI bleed   Sinus bradycardia --profound  With syncope  ?Grapefruit juice poisoning   Not clear what his meds were at the time of 12/20 admission -- if on dilt, he is an avid drinker and eater of grapefruit and the acute nature of the profound bradycardia could represent acute CCB toxicity--and this would have responded to Ca just as his hyperkalemia did  I think unlikely amio is the issue given the rapid resolution of the effect, but [facility] note from 2017 reports scant symptoms with afib so would favor less meds rather than more and suspect the RVR nn 10/20 was related to the situation of anemia and GI bleeding  For now, with BP elevated increase metop 25>> 50   COVID 19 screen The patient denies symptoms of COVID 19 at this time.  The importance of social distancing was discussed today.  Follow-up: 2-3 m OV    Current medicines are reviewed at length with the patient today.   The patient does not have concerns regarding his medicines.  The following changes were made today: metoprolol 25 >>50     Labs/ tests ordered today include: none No orders of the defined types were placed in this encounter.  He is looking for records of the discharge paperwork from [facility] to see if he was infact taking the dilt and amio as presumed on arrival 12/12  Future tests ( post COVID )    Patient Risk:  after full review of this patients clinical status, I feel that they are at moderate  risk at this  time.  Today, I have spent 25* minutes with the patient with telehealth technology discussing the above.  Signed, Virl Axe, MD  05/23/2019 2:24 PM     [facility] [address] [place] [address] [phone] (office) [phone] (fax)

## 2019-05-23 NOTE — Addendum Note (Signed)
Addended by: Thora Lance on: 05/23/2019 06:56 PM   Modules accepted: Orders

## 2019-05-23 NOTE — Telephone Encounter (Signed)
Patient is calling stating that he would like to inform Dr. Caryl Comes that he assumes the reason he had a seizure in 12/20 was due to having grapefruit while also taking medications Diltiazem 360mg  and Amiodarone 200mg . Please call and discuss.

## 2019-05-24 DIAGNOSIS — I130 Hypertensive heart and chronic kidney disease with heart failure and stage 1 through stage 4 chronic kidney disease, or unspecified chronic kidney disease: Secondary | ICD-10-CM | POA: Diagnosis not present

## 2019-05-24 DIAGNOSIS — R001 Bradycardia, unspecified: Secondary | ICD-10-CM | POA: Diagnosis not present

## 2019-05-24 DIAGNOSIS — K2900 Acute gastritis without bleeding: Secondary | ICD-10-CM | POA: Diagnosis not present

## 2019-05-24 DIAGNOSIS — J9601 Acute respiratory failure with hypoxia: Secondary | ICD-10-CM | POA: Diagnosis not present

## 2019-05-24 DIAGNOSIS — Z8674 Personal history of sudden cardiac arrest: Secondary | ICD-10-CM | POA: Diagnosis not present

## 2019-05-24 DIAGNOSIS — D62 Acute posthemorrhagic anemia: Secondary | ICD-10-CM | POA: Diagnosis not present

## 2019-05-24 NOTE — Telephone Encounter (Signed)
M  [id number]  Could you call and clarify if he was IN FACT taking Dilt and amio at the time of his event  ,Thanks SK

## 2019-05-27 NOTE — Telephone Encounter (Signed)
Left voicemail message for pt to return call.

## 2019-05-28 NOTE — Telephone Encounter (Signed)
Noted  

## 2019-05-28 NOTE — Telephone Encounter (Signed)
Spoke with pt per Dr Caryl Comes request to verify if pt was taking Diltiazem and Amiodarone at the time of his cardiac event. Pt states he  can now say with almost certainty, he was taking both medications at that time. Thanked pt and advised information will be provided for Dr Olin Pia review.  Pt verbalized understanding and agrees with plan.

## 2019-05-29 DIAGNOSIS — Z96641 Presence of right artificial hip joint: Secondary | ICD-10-CM | POA: Insufficient documentation

## 2019-05-29 DIAGNOSIS — J9601 Acute respiratory failure with hypoxia: Secondary | ICD-10-CM | POA: Diagnosis not present

## 2019-05-29 DIAGNOSIS — Z8674 Personal history of sudden cardiac arrest: Secondary | ICD-10-CM | POA: Diagnosis not present

## 2019-05-29 DIAGNOSIS — R001 Bradycardia, unspecified: Secondary | ICD-10-CM | POA: Diagnosis not present

## 2019-05-29 DIAGNOSIS — K2900 Acute gastritis without bleeding: Secondary | ICD-10-CM | POA: Diagnosis not present

## 2019-05-29 DIAGNOSIS — D62 Acute posthemorrhagic anemia: Secondary | ICD-10-CM | POA: Diagnosis not present

## 2019-05-29 DIAGNOSIS — I130 Hypertensive heart and chronic kidney disease with heart failure and stage 1 through stage 4 chronic kidney disease, or unspecified chronic kidney disease: Secondary | ICD-10-CM | POA: Diagnosis not present

## 2019-05-31 ENCOUNTER — Institutional Professional Consult (permissible substitution): Payer: Medicare Other | Admitting: Internal Medicine

## 2019-05-31 DIAGNOSIS — R001 Bradycardia, unspecified: Secondary | ICD-10-CM | POA: Diagnosis not present

## 2019-05-31 DIAGNOSIS — J9601 Acute respiratory failure with hypoxia: Secondary | ICD-10-CM | POA: Diagnosis not present

## 2019-05-31 DIAGNOSIS — Z8674 Personal history of sudden cardiac arrest: Secondary | ICD-10-CM | POA: Diagnosis not present

## 2019-05-31 DIAGNOSIS — I130 Hypertensive heart and chronic kidney disease with heart failure and stage 1 through stage 4 chronic kidney disease, or unspecified chronic kidney disease: Secondary | ICD-10-CM | POA: Diagnosis not present

## 2019-05-31 DIAGNOSIS — K2900 Acute gastritis without bleeding: Secondary | ICD-10-CM | POA: Diagnosis not present

## 2019-05-31 DIAGNOSIS — D62 Acute posthemorrhagic anemia: Secondary | ICD-10-CM | POA: Diagnosis not present

## 2019-06-03 DIAGNOSIS — K2900 Acute gastritis without bleeding: Secondary | ICD-10-CM | POA: Diagnosis not present

## 2019-06-03 DIAGNOSIS — Z8674 Personal history of sudden cardiac arrest: Secondary | ICD-10-CM | POA: Diagnosis not present

## 2019-06-03 DIAGNOSIS — J9601 Acute respiratory failure with hypoxia: Secondary | ICD-10-CM | POA: Diagnosis not present

## 2019-06-03 DIAGNOSIS — R001 Bradycardia, unspecified: Secondary | ICD-10-CM | POA: Diagnosis not present

## 2019-06-03 DIAGNOSIS — I130 Hypertensive heart and chronic kidney disease with heart failure and stage 1 through stage 4 chronic kidney disease, or unspecified chronic kidney disease: Secondary | ICD-10-CM | POA: Diagnosis not present

## 2019-06-03 DIAGNOSIS — D62 Acute posthemorrhagic anemia: Secondary | ICD-10-CM | POA: Diagnosis not present

## 2019-06-06 DIAGNOSIS — K2900 Acute gastritis without bleeding: Secondary | ICD-10-CM | POA: Diagnosis not present

## 2019-06-06 DIAGNOSIS — Z8674 Personal history of sudden cardiac arrest: Secondary | ICD-10-CM | POA: Diagnosis not present

## 2019-06-06 DIAGNOSIS — R001 Bradycardia, unspecified: Secondary | ICD-10-CM | POA: Diagnosis not present

## 2019-06-06 DIAGNOSIS — D62 Acute posthemorrhagic anemia: Secondary | ICD-10-CM | POA: Diagnosis not present

## 2019-06-06 DIAGNOSIS — J9601 Acute respiratory failure with hypoxia: Secondary | ICD-10-CM | POA: Diagnosis not present

## 2019-06-06 DIAGNOSIS — I130 Hypertensive heart and chronic kidney disease with heart failure and stage 1 through stage 4 chronic kidney disease, or unspecified chronic kidney disease: Secondary | ICD-10-CM | POA: Diagnosis not present

## 2019-06-09 NOTE — Progress Notes (Signed)
Cardiology Office Note   Date:  06/13/2019   ID:  Darryl Diaz, DOB 1941-03-15, MRN [id number]  PCP:  [name], MD  Cardiologist:  Dr. Burt Knack, MD    Chief Complaint  Patient presents with  . Follow-up    History of Present Illness: Darryl Diaz is a 79 y.o. male who presents for one month f/u, seen for Dr. Burt Knack.   Darryl Diaz has a history of paroxysmal atrial fibrillation on Eliquis, hyperlipidemia, hypertension, chronic diastolic heart failure, previous alcohol abuse-quit in 2017, GERD, depression and recent GI bleed who was recently seen in hospital consultation 04/2019 for cardiac arrest/bradycardia/chest pain.  He was last admitted to the hospital 02/2019 for GI bleed and fall at which time his hemoglobin was noted to be 6.9 and required transfusions. EGD showed Mallory-Weiss tear and erosions/erythema in his stomach.  Concern for EtOH related gastritis.  Hospital course was complicated by atrial fibrillation RVR treated with IV amiodarone with transition to oral amiodarone and home diltiazem CD as well as metoprolol. He was deemed stable to resume anticoagulation 03/01/2019 with increased PPI dosing.  He was doing well at post hospital follow-up 03/29/2019.  Unfortunately, patient reported feeling tired for 2 weeks prior to hospitalization. Due to persistent symptoms, he called EMS 04/20/2019 at which time he was noted to be bradycardic with a heart rate in the 20s. In route, patient was found to have heart rate in the 20s by EMS with question if this was a true arrest or altered level of consciousness given bradycardia with poor perfusion possibly secondary to accidental medication overdose. He required CPR for 12 minutes and 3 rounds of epi before return to spontaneous circulation. Upon arrival to the ED his heart rate improved to the 40s.  He was given atropine with improvement in heart rate and maintained NSR with rates in the 80s to 90s.  Home amiodarone,  Cardizem and Toprol were all held on presentation.  There were some reports of increased chest pain.  EKG without acute changes. CXR with significant improvement in interstitial opacities consistent with resolved edema.  Per chart review, if A. 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 Echocardiogram performed with an LVEF of 60 to 65% with left atrial dilation and tricuspid regurgitation  He was last seen by myself 05/07/2019 for hospital follow-up and was doing very well from a CV standpoint. He continued to have anterior chest soreness from CPR which was relieved with Tylenol.  BP was stable at 132/70 however he was noted to be mildly tachycardiac with a rate of 97bpm. We discussed possible reinitiation of low-dose beta-blocker therapy (Toprol XL 25mg  PO QD) however I felt that I would discuss with Dr. Burt Knack, his primary cardiologist. This was ultimately reinitiated with referral to EP who felt syncope possibly related to Ca+ channel toxicity with ETOH and grapefruit ingestion? As he was certainly taking Amiodarone and Diltiazem.    His Toprol was increased to 50mg  PO QD at that time. Diltiazem and Amio were not restarted.   Today Darryl Diaz states he is doing very well from a CV standpoint.  Reports his biggest complaint at this time is his left hip pain.  He is currently receiving PT therapy for this.  Is asking for Dr. Aurea Graff number so that he can call for an appointment.  Feels like he may need further work-up for  this.  He has had no chest pain, shortness of breath, palpitations, dizziness or syncope.  His LE edema has greatly improved.  Has not needed his as needed Lasix.  HR is a bit elevated today and we discussed increasing his Toprol a little further at this time with close follow-up.  Continues to avoid grapefruit.  Overall doing well.   Past Medical  History:  Diagnosis Date  . 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 Atrial fibrillation ([place])   . Fall at home 03/28/2016  . GERD (gastroesophageal reflux disease)   . H/O seasonal allergies   . Hx of adenomatous colonic polyps 08/10/2017  . Hyperlipidemia   . Hypertension   . Insomnia   . 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 OA (osteoarthritis)    hands    Past Surgical History:  Procedure Laterality Date  . ANKLE SURGERY Left 2014   had rods put in   . BIOPSY  02/28/2019   Procedure: BIOPSY;  Surgeon: Milus Banister, MD;  Location: [facility] ENDOSCOPY;  Service: Endoscopy;;  . [place]  . ESOPHAGOGASTRODUODENOSCOPY (EGD) WITH PROPOFOL N/A 02/28/2019   Procedure: ESOPHAGOGASTRODUODENOSCOPY (EGD) WITH PROPOFOL;  Surgeon: Milus Banister, MD;  Location: [facility] ENDOSCOPY;  Service: Endoscopy;  Laterality: N/A;  . HIP ARTHROPLASTY Left 04/01/2016   Procedure: ARTHROPLASTY BIPOLAR HIP (HEMIARTHROPLASTY);  Surgeon: Paralee Cancel, MD;  Location: [place];  Service: Orthopedics;  Laterality: Left;  . HIP CLOSED REDUCTION Left 04/30/2016   Procedure: CLOSED REDUCTION HIP;  Surgeon: Wylene Simmer, MD;  Location: [place];  Service: Orthopedics;  Laterality: Left;  . TONSILLECTOMY       Current Outpatient Medications  Medication Sig Dispense Refill  . acetaminophen (TYLENOL) 500 MG tablet Take 1,000 mg by mouth every 6 (six) hours as needed for headache (pain).    Marland Kitchen apixaban (ELIQUIS) 5 MG TABS tablet Take 1 tablet (5 mg total) by mouth 2 (two) times daily.    Marland Kitchen atorvastatin (LIPITOR) 20 MG tablet Take 20 mg by mouth daily.    Marland Kitchen escitalopram (LEXAPRO) 10 MG tablet Take 10 mg by mouth daily.    . ferrous sulfate 325 (65 FE) MG EC tablet Take 325 mg by mouth daily.     . folic acid (FOLVITE) 1 MG tablet Take 1 mg by mouth daily.    . furosemide (LASIX) 40 MG tablet Take 40 mg by mouth daily as needed for fluid (weight gain).    Marland Kitchen loratadine  (CLARITIN) 10 MG tablet Take 10 mg by mouth daily.    . metoprolol succinate (TOPROL XL) 25 MG 24 hr tablet Take 2 tablets (50 mg total) by mouth daily. 180 tablet 3  . Multiple Vitamin (MULTIVITAMIN WITH MINERALS) TABS tablet Take 1 tablet by mouth daily.    . pantoprazole (PROTONIX) 40 MG tablet Take 40 mg by mouth daily.    . potassium chloride SA (KLOR-CON) 20 MEQ tablet Take 2 tablets (40 mEq total) by mouth daily. 90 tablet 3  . predniSONE (DELTASONE) 10 MG tablet Take 4 tablets (40 mg total) by mouth daily with breakfast. Take 4 tabs x 1 day, then 3 tabs x 1 day, then 2 tabs x 1 day, then 1 tab x 1 day (Patient not taking: Reported on 05/23/2019) 10 tablet 0  . tamsulosin (FLOMAX) 0.4 MG CAPS capsule Take 0.4 mg by mouth at bedtime.     . Thiamine HCl (VITAMIN  B-1) 250 MG tablet Take 250 mg by mouth daily.    . traZODone (DESYREL) 50 MG tablet Take 50 mg by mouth at bedtime.     No current facility-administered medications for this visit.   Facility-Administered Medications Ordered in Other Visits  Medication Dose Route Frequency Provider Last Rate Last Admin  . metoprolol tartrate (LOPRESSOR) injection    Anesthesia Intra-op Mariea Clonts, CRNA   5 mg at 02/27/19 1318    Allergies:   Penicillins    Social History:  The patient  reports that he has quit smoking. He has never used smokeless tobacco. He reports that he does not drink alcohol or use drugs.   Family History:  The patient's family history includes Arthritis in his mother; Heart disease (age of onset: 62) in his mother; Heart failure in his sister; Heart failure (age of onset: 77) in his father; Hypertension in his mother; Stroke in his maternal aunt.    ROS:  Please see the history of present illness. Otherwise, review of systems are positive for none.   All other systems are reviewed and negative.    PHYSICAL EXAM: VS:  There were no vitals taken for this visit. , BMI There is no height or weight on file to  calculate BMI.   General: Well developed, well nourished, NAD Neck: Negative for carotid bruits. No JVD Lungs:Clear to ausculation bilaterally. No wheezes, rales, or rhonchi. Breathing is unlabored. Cardiovascular: RRR with S1 S2. No murmur Extremities: No edema.. DP pulses 2+ bilaterally Neuro: Alert and oriented. No focal deficits. No facial asymmetry. MAE spontaneously. Psych: Responds to questions appropriately with normal affect.     EKG:  EKG is not ordered today.   Recent Labs: 02/25/2019: B Natriuretic Peptide 209.0 03/05/2019: ALT 50 04/22/2019: Magnesium 1.5; Platelets 182; TSH 3.727 04/23/2019: BUN 14; Creatinine, Ser 0.92; Hemoglobin 12.0; Potassium 3.8; Sodium 138    Lipid Panel    Component Value Date/Time   CHOL 81 02/28/2019 0314   TRIG 27 02/28/2019 0314   HDL 47 02/28/2019 0314   CHOLHDL 1.7 02/28/2019 0314   VLDL 5 02/28/2019 0314   LDLCALC 29 02/28/2019 0314     Wt Readings from Last 3 Encounters:  05/23/19 249 lb (112.9 kg)  05/07/19 251 lb 6.4 oz (114 kg)  04/23/19 243 lb 6.2 oz (110.4 kg)    Other studies Reviewed: Additional studies/ records that were reviewed today include:   Echocardiogram 04/21/2019:  1. The left ventricle has no regional wall motion abnormalities. 2. LVEF is 60-65%. 3. Left atrial size was severely dilated. 4. The mitral valve was not assessed. not assessed mitral valve regurgitation. No evidence of mitral stenosis. 5. The tricuspid valve is normal in structure. Tricuspid valve regurgitation is trivial. 6. The pulmonic valve was not well visualized. Pulmonic valve regurgitation not assessed. 7. Limited echo s/p cardiac arrest 8. Not assessed. 9. The aortic valve was not assessed. Aortic valve regurgitation not assessed. 10. Left ventricular ejection fraction, by visual estimation, is 60 to 65%. The left ventricle has normal function. There is not assseded. 11. Global right ventricle has normal systolic  function.The right ventricular size is not assessed. Right vetricular wall thickness was not assessed.  ASSESSMENT AND PLAN:  1.  Symptomatic bradycardia: -Diltiazem and amiodarone have been held since hospital consultation 04/22/2019>>continue for now  -HR was found to be elevated at last OV>>metoprolol was re-initiated. He was then seen by EP at which time BB was increased to 50mg . -Referred to EP  who felt that brady arrest was in the setting of Ca+ channel toxicity>>amio and dilt were not restarted -HR today 89 bpm>>> will increase Toprol to 75 mg p.o. daily -Has EP follow-up for 2021  2.  Chest pain: -No recurrence -Felt previously to be in the setting of CPR, musculoskeletal>>releived with Tylenol  3.  Anemia from prior GI bleed: -Hemoglobin from previous hospitalization 6.9 requiring transfusions however stable at discharge 03/05/2019 10.2.  -Eliquis was restarted during that admission without further bleeding>>stable per patient report at PCP OV -Conitnue Eliquis with high-dose PPI  4. Hypertension:  -BP stable, 128/64  6. Hyperlipidemia -Last LDL, 29 -On statin   7. Paroxysmal atrial fibrillation:  -Currently in sinus rhythm with  -As above Cardizem and amiodarone have been held secondary to severe bradycardia event -Metoprolol restarted>>tolerating well  -Continue Elquis, PPI    Current medicines are reviewed at length with the patient today.  The patient does not have concerns regarding medicines.  The following changes have been made: Increase Toprol to 75 mg p.o. daily  Labs/ tests ordered today include: None  No orders of the defined types were placed in this encounter.   Disposition:   FU with Dr. Caryl Comes in 2 months  Signed, Kathyrn Drown, NP  06/13/2019 [address], [place], [place]  [address] Phone: [phone]; Fax: [phone]

## 2019-06-11 DIAGNOSIS — E785 Hyperlipidemia, unspecified: Secondary | ICD-10-CM | POA: Diagnosis not present

## 2019-06-11 DIAGNOSIS — R531 Weakness: Secondary | ICD-10-CM | POA: Diagnosis not present

## 2019-06-11 DIAGNOSIS — F1011 Alcohol abuse, in remission: Secondary | ICD-10-CM | POA: Diagnosis not present

## 2019-06-11 DIAGNOSIS — I130 Hypertensive heart and chronic kidney disease with heart failure and stage 1 through stage 4 chronic kidney disease, or unspecified chronic kidney disease: Secondary | ICD-10-CM | POA: Diagnosis not present

## 2019-06-11 DIAGNOSIS — Z8674 Personal history of sudden cardiac arrest: Secondary | ICD-10-CM | POA: Diagnosis not present

## 2019-06-11 DIAGNOSIS — R001 Bradycardia, unspecified: Secondary | ICD-10-CM | POA: Diagnosis not present

## 2019-06-11 DIAGNOSIS — M1990 Unspecified osteoarthritis, unspecified site: Secondary | ICD-10-CM | POA: Diagnosis not present

## 2019-06-11 DIAGNOSIS — J9601 Acute respiratory failure with hypoxia: Secondary | ICD-10-CM | POA: Diagnosis not present

## 2019-06-11 DIAGNOSIS — I480 Paroxysmal atrial fibrillation: Secondary | ICD-10-CM | POA: Diagnosis not present

## 2019-06-11 DIAGNOSIS — Z7901 Long term (current) use of anticoagulants: Secondary | ICD-10-CM | POA: Diagnosis not present

## 2019-06-11 DIAGNOSIS — Z87891 Personal history of nicotine dependence: Secondary | ICD-10-CM | POA: Diagnosis not present

## 2019-06-11 DIAGNOSIS — Z9181 History of falling: Secondary | ICD-10-CM | POA: Diagnosis not present

## 2019-06-11 DIAGNOSIS — N183 Chronic kidney disease, stage 3 unspecified: Secondary | ICD-10-CM | POA: Diagnosis not present

## 2019-06-11 DIAGNOSIS — D62 Acute posthemorrhagic anemia: Secondary | ICD-10-CM | POA: Diagnosis not present

## 2019-06-11 DIAGNOSIS — K2900 Acute gastritis without bleeding: Secondary | ICD-10-CM | POA: Diagnosis not present

## 2019-06-11 DIAGNOSIS — I5033 Acute on chronic diastolic (congestive) heart failure: Secondary | ICD-10-CM | POA: Diagnosis not present

## 2019-06-11 DIAGNOSIS — F329 Major depressive disorder, single episode, unspecified: Secondary | ICD-10-CM | POA: Diagnosis not present

## 2019-06-13 ENCOUNTER — Encounter: Payer: Self-pay | Admitting: Cardiology

## 2019-06-13 ENCOUNTER — Ambulatory Visit (INDEPENDENT_AMBULATORY_CARE_PROVIDER_SITE_OTHER): Payer: Medicare Other | Admitting: Cardiology

## 2019-06-13 ENCOUNTER — Other Ambulatory Visit: Payer: Self-pay

## 2019-06-13 VITALS — BP 128/64 | HR 89 | Ht 72.0 in | Wt 252.0 lb

## 2019-06-13 DIAGNOSIS — I10 Essential (primary) hypertension: Secondary | ICD-10-CM

## 2019-06-13 DIAGNOSIS — R7301 Impaired fasting glucose: Secondary | ICD-10-CM | POA: Diagnosis not present

## 2019-06-13 DIAGNOSIS — R001 Bradycardia, unspecified: Secondary | ICD-10-CM | POA: Diagnosis not present

## 2019-06-13 DIAGNOSIS — Z125 Encounter for screening for malignant neoplasm of prostate: Secondary | ICD-10-CM | POA: Diagnosis not present

## 2019-06-13 DIAGNOSIS — I480 Paroxysmal atrial fibrillation: Secondary | ICD-10-CM

## 2019-06-13 DIAGNOSIS — E7849 Other hyperlipidemia: Secondary | ICD-10-CM | POA: Diagnosis not present

## 2019-06-13 DIAGNOSIS — E7800 Pure hypercholesterolemia, unspecified: Secondary | ICD-10-CM | POA: Diagnosis not present

## 2019-06-13 DIAGNOSIS — R82998 Other abnormal findings in urine: Secondary | ICD-10-CM | POA: Diagnosis not present

## 2019-06-13 MED ORDER — METOPROLOL SUCCINATE ER 50 MG PO TB24: ORAL_TABLET | ORAL | 2 refills | Status: DC

## 2019-06-13 MED ORDER — FUROSEMIDE 40 MG PO TABS: 40.0000 mg | ORAL_TABLET | Freq: Every day | ORAL | 3 refills | Status: DC | PRN

## 2019-06-13 NOTE — Patient Instructions (Addendum)
Medication Instructions:   Your physician has recommended you make the following change in your medication:   1) Increase Metoprolol to 75 mg, 1.5 tablets by mouth once a day  *If you need a refill on your cardiac medications before your next appointment, please call your pharmacy*  Lab Work:  None ordered today  If you have labs (blood work) drawn today and your tests are completely normal, you will receive your results only by: Marland Kitchen MyChart Message (if you have MyChart) OR . A paper copy in the mail If you have any lab test that is abnormal or we need to change your treatment, we will call you to review the results.  Testing/Procedures:  None ordered today  Follow-Up: At [facility], you and your health needs are our priority.  As part of our continuing mission to provide you with exceptional heart care, we have created designated Provider Care Teams.  These Care Teams include your primary Cardiologist (physician) and Advanced Practice Providers (APPs -  Physician Assistants and Nurse Practitioners) who all work together to provide you with the care you need, when you need it.  Your next appointment:    As scheduled

## 2019-06-17 DIAGNOSIS — I130 Hypertensive heart and chronic kidney disease with heart failure and stage 1 through stage 4 chronic kidney disease, or unspecified chronic kidney disease: Secondary | ICD-10-CM | POA: Diagnosis not present

## 2019-06-17 DIAGNOSIS — D62 Acute posthemorrhagic anemia: Secondary | ICD-10-CM | POA: Diagnosis not present

## 2019-06-17 DIAGNOSIS — R001 Bradycardia, unspecified: Secondary | ICD-10-CM | POA: Diagnosis not present

## 2019-06-17 DIAGNOSIS — Z8674 Personal history of sudden cardiac arrest: Secondary | ICD-10-CM | POA: Diagnosis not present

## 2019-06-17 DIAGNOSIS — K2900 Acute gastritis without bleeding: Secondary | ICD-10-CM | POA: Diagnosis not present

## 2019-06-17 DIAGNOSIS — J9601 Acute respiratory failure with hypoxia: Secondary | ICD-10-CM | POA: Diagnosis not present

## 2019-06-20 ENCOUNTER — Other Ambulatory Visit: Payer: Self-pay | Admitting: *Deleted

## 2019-06-20 NOTE — Patient Outreach (Signed)
[facility]) Care Management  06/20/2019  Darryl Diaz [birth year] [id number]    Telephone Assessment-Successful-HF  RN spoke with pt today and received an update on his ongoing management of care. Pt reports he is doing well and has Lasix PRN available with instructions to take with any fluid retention from his provider. Denies any swelling or related symptoms at this time. Reports his weights are always within 1 lbs of 249.5 lbs with no reported issues or symptoms. Only other change related to his BP with an increase in his Metoprolol 75 mg daily. CAD provider and restricted pt on certain food items that conflict with his medications. Pt has obtained a pulse ox and a BP device for home monitoring. Pt continue to be involved with [facility] for ongoing PT services and continue to do well.  Pt inquired on a resource to receive the [place]-vid vaccination. RN provided information address and contact to [facility] who offers the vaccination to the community at [facility]. Pt very grateful and appreciative for the information.   RN discussed the current plan of care and all goals. Interventions adjusted accordingly as RN encouraged adherence with early intervention related to any precipitating symptoms that should be presented to his CAD provider to prevent acute events. Based upon pt's progress in managing is care will follow up quarterly and update provider on pt's disposition with [facility] services.  [facility] CM Care Plan Problem One     Most Recent Value  Care Plan Problem One  Knowledge Deficit related to [place] plan  Role Documenting the Problem One  Care Management Telephonic Coordinator  Care Plan for Problem One  Active  [facility] Long Term Goal   Pt will verbalize the plan of action in the GREEN zone within the next 90 days.  [facility] Long Term Goal Start Date  04/08/19  Interventions for Problem One Long Term Goal  Will continue to verify pt's awareness with the HF zones and adherence with  his ongoing management of care.  [facility] CM Short Term Goal #1   Pt will verbalize maintaining weight within 2 pounds of goal within the next 30 days.  [facility] CM Short Term Goal #1 Start Date  04/08/19  [facility] CM Short Term Goal #1 Met Date  06/20/19  [facility] CM Short Term Goal #2   Pt will adhere to all medical appointments  [facility] CM Short Term Goal #2 Start Date  04/08/19  [facility] CM Short Term Goal #2 Met Date  06/20/19      [name], RN Care Management Coordinator [place] Office [phone]

## 2019-06-25 DIAGNOSIS — I5032 Chronic diastolic (congestive) heart failure: Secondary | ICD-10-CM | POA: Diagnosis not present

## 2019-06-25 DIAGNOSIS — I480 Paroxysmal atrial fibrillation: Secondary | ICD-10-CM | POA: Diagnosis not present

## 2019-06-25 DIAGNOSIS — D126 Benign neoplasm of colon, unspecified: Secondary | ICD-10-CM | POA: Diagnosis not present

## 2019-06-25 DIAGNOSIS — N401 Enlarged prostate with lower urinary tract symptoms: Secondary | ICD-10-CM | POA: Diagnosis not present

## 2019-06-25 DIAGNOSIS — E7800 Pure hypercholesterolemia, unspecified: Secondary | ICD-10-CM | POA: Diagnosis not present

## 2019-06-25 DIAGNOSIS — Z7901 Long term (current) use of anticoagulants: Secondary | ICD-10-CM | POA: Diagnosis not present

## 2019-06-25 DIAGNOSIS — F321 Major depressive disorder, single episode, moderate: Secondary | ICD-10-CM | POA: Diagnosis not present

## 2019-06-25 DIAGNOSIS — Z8781 Personal history of (healed) traumatic fracture: Secondary | ICD-10-CM | POA: Diagnosis not present

## 2019-06-25 DIAGNOSIS — Z1331 Encounter for screening for depression: Secondary | ICD-10-CM | POA: Diagnosis not present

## 2019-06-25 DIAGNOSIS — Z0000 Encounter for general adult medical examination without abnormal findings: Secondary | ICD-10-CM | POA: Diagnosis not present

## 2019-06-25 DIAGNOSIS — R7301 Impaired fasting glucose: Secondary | ICD-10-CM | POA: Diagnosis not present

## 2019-06-25 DIAGNOSIS — I110 Hypertensive heart disease with heart failure: Secondary | ICD-10-CM | POA: Diagnosis not present

## 2019-06-25 DIAGNOSIS — K219 Gastro-esophageal reflux disease without esophagitis: Secondary | ICD-10-CM | POA: Diagnosis not present

## 2019-06-27 ENCOUNTER — Encounter: Payer: Self-pay | Admitting: *Deleted

## 2019-06-27 DIAGNOSIS — M79671 Pain in right foot: Secondary | ICD-10-CM | POA: Insufficient documentation

## 2019-06-28 DIAGNOSIS — M5136 Other intervertebral disc degeneration, lumbar region: Secondary | ICD-10-CM | POA: Diagnosis not present

## 2019-06-28 DIAGNOSIS — M1611 Unilateral primary osteoarthritis, right hip: Secondary | ICD-10-CM | POA: Diagnosis not present

## 2019-06-28 DIAGNOSIS — M545 Low back pain: Secondary | ICD-10-CM | POA: Diagnosis not present

## 2019-06-28 DIAGNOSIS — M25551 Pain in right hip: Secondary | ICD-10-CM | POA: Diagnosis not present

## 2019-06-28 DIAGNOSIS — Z96642 Presence of left artificial hip joint: Secondary | ICD-10-CM | POA: Diagnosis not present

## 2019-06-30 ENCOUNTER — Ambulatory Visit: Payer: Medicare Other | Attending: Internal Medicine

## 2019-06-30 DIAGNOSIS — Z23 Encounter for immunization: Secondary | ICD-10-CM

## 2019-06-30 NOTE — Progress Notes (Signed)
   Covid-19 Vaccination Clinic  Name:  Darryl Diaz    MRN: [id number] DOB: 1940-12-22  06/30/2019  [name] was observed post Covid-19 immunization for 15 minutes without incidence. He was provided with Vaccine Information Sheet and instruction to access the V-Safe system.   [name] was instructed to call 911 with any severe reactions post vaccine: Marland Kitchen Difficulty breathing  . Swelling of your face and throat  . A fast heartbeat  . A bad rash all over your body  . Dizziness and weakness    Immunizations Administered    Name Date Dose VIS Date Route   Pfizer COVID-19 Vaccine 06/30/2019  8:46 AM 0.3 mL 04/19/2019 Intramuscular   Manufacturer: [place]   Lot: [id number]   [place]: [id number]

## 2019-07-24 ENCOUNTER — Ambulatory Visit: Payer: Medicare Other | Attending: Internal Medicine

## 2019-07-24 DIAGNOSIS — Z23 Encounter for immunization: Secondary | ICD-10-CM

## 2019-07-24 NOTE — Progress Notes (Signed)
   Covid-19 Vaccination Clinic  Name:  Darryl Diaz    MRN: [id number] DOB: 1940/08/01  07/24/2019  [name] was observed post Covid-19 immunization for 15 minutes without incident. He was provided with Vaccine Information Sheet and instruction to access the V-Safe system.   [name] was instructed to call 911 with any severe reactions post vaccine: Marland Kitchen Difficulty breathing  . Swelling of face and throat  . A fast heartbeat  . A bad rash all over body  . Dizziness and weakness   Immunizations Administered    Name Date Dose VIS Date Route   Pfizer COVID-19 Vaccine 07/24/2019  9:09 AM 0.3 mL 04/19/2019 Intramuscular   Manufacturer: [place]   Lot: [id number]   [place]: [id number]

## 2019-07-25 NOTE — Progress Notes (Signed)
PCP - Domenick Gong Cardiologist - Virl Axe 06-13-19 lov   Chest x-ray - 04-22-19 epic EKG - 04-22-19 epic Stress Test -  ECHO - 04-21-19 epic Cardiac Cath -   Sleep Study -  CPAP -   Fasting Blood Sugar -  Checks Blood Sugar _____ times a day  Blood Thinner Instructions: Aspirin Instructions: Last Dose:  Anesthesia review:   Patient denies shortness of breath, fever, cough and chest pain at PAT appointment   Patient verbalized understanding of instructions that were given to them at the PAT appointment. Patient was also instructed that they will need to review over the PAT instructions again at home before surgery.

## 2019-07-25 NOTE — Patient Instructions (Addendum)
DUE TO COVID-19 ONLY ONE VISITOR IS ALLOWED TO COME WITH YOU AND STAY IN THE WAITING ROOM ONLY DURING PRE OP AND PROCEDURE DAY OF SURGERY. THE 1 VISITOR MAY VISIT WITH YOU AFTER SURGERY IN YOUR PRIVATE ROOM DURING VISITING HOURS ONLY!  10 a -8 p  YOU NEED TO HAVE A COVID 19 TEST ON_3-26-21______ @_______ , THIS TEST MUST BE DONE BEFORE SURGERY, COME  [address], [place] , [address].  ([place]) ONCE YOUR COVID TEST IS COMPLETED, PLEASE BEGIN THE QUARANTINE INSTRUCTIONS AS OUTLINED IN YOUR HANDOUT.                Darryl Diaz  07/25/2019    Your procedure is scheduled on:    08-06-19   Report to [facility] Main  Entrance   Report to admitting at         0900 AM     Call this number if you have problems the morning of surgery [phone]    Remember: NO SOLID FOOD AFTER MIDNIGHT THE NIGHT PRIOR TO SURGERY. NOTHING BY MOUTH EXCEPT CLEAR LIQUIDS UNTIL    0830 am . PLEASE FINISH   ENSURE    DRINK PER SURGEON ORDER  WHICH NEEDS TO BE COMPLETED AT      0830 am then nothing by mouth .    CLEAR LIQUID DIET   Foods Allowed                                                                                                 Foods Excluded  Coffee and tea, regular and decaf    NO creamer                                               liquids that you cannot  Plain Jell-O any favor except red or purple                                           see through such as: Fruit ices (not with fruit pulp)                                                                         milk, soups, orange juice  Iced Popsicles  All solid food Carbonated beverages, regular and diet                                    Cranberry, grape and apple juices Sports drinks like Gatorade Lightly seasoned clear broth or consume(fat free) Sugar, honey  syrup  _____________________________________________________________________  BRUSH YOUR TEETH MORNING OF SURGERY AND RINSE YOUR MOUTH OUT, NO CHEWING GUM CANDY OR MINTS.     Take these medicines the morning of surgery with A SIP OF WATER: protonix, metoprolol, loratadine, lexapro, lipitor                                 You may not have any metal on your body including hair pins and              piercings  Do not wear jewelry,  lotions, powders or perfumes, deodorant                   Men may shave face and neck.   Do not bring valuables to the hospital. [place].  Contacts, dentures or bridgework may not be worn into surgery.   Name and phone number of your driver:  Special Instructions: N/A              Please read over the following fact sheets you were given: _____________________________________________________________________           [facility] - Preparing for Surgery Before surgery, you can play an important role.  Because skin is not sterile, your skin needs to be as free of germs as possible.  You can reduce the number of germs on your skin by washing with CHG (chlorahexidine gluconate) soap before surgery.  CHG is an antiseptic cleaner which kills germs and bonds with the skin to continue killing germs even after washing. Please DO NOT use if you have an allergy to CHG or antibacterial soaps.  If your skin becomes reddened/irritated stop using the CHG and inform your nurse when you arrive at Short Stay. Do not shave (including legs and underarms) for at least 48 hours prior to the first CHG shower.  You may shave your face/neck. Please follow these instructions carefully:  1.  Shower with CHG Soap the night before surgery and the  morning of Surgery.  2.  If you choose to wash your hair, wash your hair first as usual with your  normal  shampoo.  3.  After you shampoo, rinse your hair and body thoroughly to remove the   shampoo.                           4.  Use CHG as you would any other liquid soap.  You can apply chg directly  to the skin and wash                       Gently with a scrungie or clean washcloth.  5.  Apply the CHG Soap to your body ONLY FROM THE NECK DOWN.   Do not use on face/ open  Wound or open sores. Avoid contact with eyes, ears mouth and genitals (private parts).                       Wash face,  Genitals (private parts) with your normal soap.             6.  Wash thoroughly, paying special attention to the area where your surgery  will be performed.  7.  Thoroughly rinse your body with warm water from the neck down.  8.  DO NOT shower/wash with your normal soap after using and rinsing off  the CHG Soap.                9.  Pat yourself dry with a clean towel.            10.  Wear clean pajamas.            11.  Place clean sheets on your bed the night of your first shower and do not  sleep with pets. Day of Surgery : Do not apply any lotions/deodorants the morning of surgery.  Please wear clean clothes to the hospital/surgery center.  FAILURE TO FOLLOW THESE INSTRUCTIONS MAY RESULT IN THE CANCELLATION OF YOUR SURGERY PATIENT SIGNATURE_________________________________  NURSE SIGNATURE__________________________________  ________________________________________________________________________   Darryl Diaz  An incentive spirometer is a tool that can help keep your lungs clear and active. This tool measures how well you are filling your lungs with each breath. Taking long deep breaths may help reverse or decrease the chance of developing breathing (pulmonary) problems (especially infection) following:  A long period of time when you are unable to move or be active. BEFORE THE PROCEDURE   If the spirometer includes an indicator to show your best effort, your nurse or respiratory therapist will set it to a desired goal.  If possible, sit up straight  or lean slightly forward. Try not to slouch.  Hold the incentive spirometer in an upright position. INSTRUCTIONS FOR USE  1. Sit on the edge of your bed if possible, or sit up as far as you can in bed or on a chair. 2. Hold the incentive spirometer in an upright position. 3. Breathe out normally. 4. Place the mouthpiece in your mouth and seal your lips tightly around it. 5. Breathe in slowly and as deeply as possible, raising the piston or the ball toward the top of the column. 6. Hold your breath for 3-5 seconds or for as long as possible. Allow the piston or ball to fall to the bottom of the column. 7. Remove the mouthpiece from your mouth and breathe out normally. 8. Rest for a few seconds and repeat Steps 1 through 7 at least 10 times every 1-2 hours when you are awake. Take your time and take a few normal breaths between deep breaths. 9. The spirometer may include an indicator to show your best effort. Use the indicator as a goal to work toward during each repetition. 10. After each set of 10 deep breaths, practice coughing to be sure your lungs are clear. If you have an incision (the cut made at the time of surgery), support your incision when coughing by placing a pillow or rolled up towels firmly against it. Once you are able to get out of bed, walk around indoors and cough well. You may stop using the incentive spirometer when instructed by your caregiver.  RISKS AND COMPLICATIONS  Take your time so you do not get  dizzy or light-headed.  If you are in pain, you may need to take or ask for pain medication before doing incentive spirometry. It is harder to take a deep breath if you are having pain. AFTER USE  Rest and breathe slowly and easily.  It can be helpful to keep track of a log of your progress. Your caregiver can provide you with a simple table to help with this. If you are using the spirometer at home, follow these instructions: [place] IF:   You are having  difficultly using the spirometer.  You have trouble using the spirometer as often as instructed.  Your pain medication is not giving enough relief while using the spirometer.  You develop fever of 100.5 F (38.1 C) or higher. SEEK IMMEDIATE MEDICAL CARE IF:   You cough up bloody sputum that had not been present before.  You develop fever of 102 F (38.9 C) or greater.  You develop worsening pain at or near the incision site. MAKE SURE YOU:   Understand these instructions.  Will watch your condition.  Will get help right away if you are not doing well or get worse. Document Released: 09/05/2006 Document Revised: 07/18/2011 Document Reviewed: 11/06/2006 ExitCare Patient Information 2014 ExitCare, [place].   ________________________________________________________________________  WHAT IS A BLOOD TRANSFUSION? Blood Transfusion Information  A transfusion is the replacement of blood or some of its parts. Blood is made up of multiple cells which provide different functions.  Red blood cells carry oxygen and are used for blood loss replacement.  White blood cells fight against infection.  Platelets control bleeding.  Plasma helps clot blood.  Other blood products are available for specialized needs, such as hemophilia or other clotting disorders. BEFORE THE TRANSFUSION  Who gives blood for transfusions?   Healthy volunteers who are fully evaluated to make sure their blood is safe. This is blood bank blood. Transfusion therapy is the safest it has ever been in the practice of medicine. Before blood is taken from a donor, a complete history is taken to make sure that person has no history of diseases nor engages in risky social behavior (examples are intravenous drug use or sexual activity with multiple partners). The donor's travel history is screened to minimize risk of transmitting infections, such as malaria. The donated blood is tested for signs of infectious diseases, such as  HIV and hepatitis. The blood is then tested to be sure it is compatible with you in order to minimize the chance of a transfusion reaction. If you or a relative donates blood, this is often done in anticipation of surgery and is not appropriate for emergency situations. It takes many days to process the donated blood. RISKS AND COMPLICATIONS Although transfusion therapy is very safe and saves many lives, the main dangers of transfusion include:   Getting an infectious disease.  Developing a transfusion reaction. This is an allergic reaction to something in the blood you were given. Every precaution is taken to prevent this. The decision to have a blood transfusion has been considered carefully by your caregiver before blood is given. Blood is not given unless the benefits outweigh the risks. AFTER THE TRANSFUSION  Right after receiving a blood transfusion, you will usually feel much better and more energetic. This is especially true if your red blood cells have gotten low (anemic). The transfusion raises the level of the red blood cells which carry oxygen, and this usually causes an energy increase.  The nurse administering the transfusion will  monitor you carefully for complications. HOME CARE INSTRUCTIONS  No special instructions are needed after a transfusion. You may find your energy is better. Speak with your caregiver about any limitations on activity for underlying diseases you may have. SEEK MEDICAL CARE IF:   Your condition is not improving after your transfusion.  You develop redness or irritation at the intravenous (IV) site. SEEK IMMEDIATE MEDICAL CARE IF:  Any of the following symptoms occur over the next 12 hours:  Shaking chills.  You have a temperature by mouth above 102 F (38.9 C), not controlled by medicine.  Chest, back, or muscle pain.  People around you feel you are not acting correctly or are confused.  Shortness of breath or difficulty breathing.  Dizziness  and fainting.  You get a rash or develop hives.  You have a decrease in urine output.  Your urine turns a dark color or changes to pink, red, or brown. Any of the following symptoms occur over the next 10 days:  You have a temperature by mouth above 102 F (38.9 C), not controlled by medicine.  Shortness of breath.  Weakness after normal activity.  The white part of the eye turns yellow (jaundice).  You have a decrease in the amount of urine or are urinating less often.  Your urine turns a dark color or changes to pink, red, or brown. Document Released: 04/22/2000 Document Revised: 07/18/2011 Document Reviewed: 12/10/2007 [facility] Patient Information 2014 [place], [place].  _______________________________________________________________________

## 2019-07-29 NOTE — H&P (Signed)
TOTAL HIP ADMISSION H&P  Patient is admitted for right total hip arthroplasty, anterior approach.  Subjective:  Chief Complaint:    Right hip primary OA / pain  HPI: Darryl Diaz, 79 y.o. male, has a history of pain and functional disability in the right hip(s) due to arthritis and patient has failed non-surgical conservative treatments for greater than 12 weeks to include NSAID's and/or analgesics, use of assistive devices and activity modification.  Onset of symptoms was gradual starting <1 year ago with rapidlly worsening course since that time.The patient noted prior procedures of the hip to include arthroplasty on the left hip 3.5 years ago.  Patient currently rates pain in the right hip at 9 out of 10 with activity. Patient has worsening of pain with activity and weight bearing, trendelenberg gait, pain that interfers with activities of daily living and pain with passive range of motion. Patient has evidence of periarticular osteophytes and joint space narrowing by imaging studies. This condition presents safety issues increasing the risk of falls.  There is no current active infection.  Risks, benefits and expectations were discussed with the patient.  Risks including but not limited to the risk of anesthesia, blood clots, nerve damage, blood vessel damage, failure of the prosthesis, infection and up to and including death.  Patient understand the risks, benefits and expectations and wishes to proceed with surgery.   PCP: [name], MD  D/C Plans:       Home   Post-op Meds:       No Rx given   Tranexamic Acid:      To be given - IV   Decadron:      Is to be given  FYI:     Eliquis  Norco  DME:   Pt already has equipment  PT:   [facility]  Pharmacy: [facility]    Patient Active Problem List   Diagnosis Date Noted  . Bradycardia 04/20/2019  . Atrial fibrillation with rapid ventricular response ([place])   . GI bleed 02/25/2019  . Falls 02/25/2019  . Bilateral leg edema  02/25/2019  . Hyponatremia 02/25/2019  . Acute posthemorrhagic anemia 08/11/2018  . Symptomatic anemia 08/11/2018  . Hx of adenomatous colonic polyps 08/10/2017  . PAF (paroxysmal atrial fibrillation) ([place]) 02/05/2017  . Dislocation of hip, posterior, left, closed ([place]) 04/30/2016  . Hip fracture ([place]) 03/30/2016  . Hypertension 03/30/2016  . Hyperlipidemia 03/30/2016  . GERD (gastroesophageal reflux disease) 03/30/2016  . Osteoarthritis 03/30/2016  . Depression 03/30/2016  . Rhabdomyolysis 03/30/2016  . Leukocytosis 03/30/2016  . Pressure injury of skin 03/30/2016  . Closed fracture of left hip [facility])    Past Medical History:  Diagnosis Date  . 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 Atrial fibrillation ([place])   . Fall at home 03/28/2016  . GERD (gastroesophageal reflux disease)   . H/O seasonal allergies   . Hx of adenomatous colonic polyps 08/10/2017  . Hyperlipidemia   . Hypertension   . Insomnia   . 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 OA (osteoarthritis)    hands    Past Surgical History:  Procedure Laterality Date  . ANKLE SURGERY Left 2014   had rods put in   . BIOPSY  02/28/2019   Procedure: BIOPSY;  Surgeon: Milus Banister, MD;  Location: [facility] ENDOSCOPY;  Service: Endoscopy;;  . [place]  . ESOPHAGOGASTRODUODENOSCOPY (EGD) WITH PROPOFOL N/A 02/28/2019  Procedure: ESOPHAGOGASTRODUODENOSCOPY (EGD) WITH PROPOFOL;  Surgeon: Milus Banister, MD;  Location: [facility] ENDOSCOPY;  Service: Endoscopy;  Laterality: N/A;  . HIP ARTHROPLASTY Left 04/01/2016   Procedure: ARTHROPLASTY BIPOLAR HIP (HEMIARTHROPLASTY);  Surgeon: Paralee Cancel, MD;  Location: [place];  Service: Orthopedics;  Laterality: Left;  . HIP CLOSED REDUCTION Left 04/30/2016   Procedure: CLOSED REDUCTION HIP;  Surgeon: Wylene Simmer, MD;  Location: [place];  Service: Orthopedics;  Laterality: Left;  . TONSILLECTOMY      No current facility-administered  medications for this encounter.   Current Outpatient Medications  Medication Sig Dispense Refill Last Dose  . acetaminophen (TYLENOL) 500 MG tablet Take 1,000 mg by mouth every 6 (six) hours as needed for headache (pain).     Marland Kitchen apixaban (ELIQUIS) 5 MG TABS tablet Take 1 tablet (5 mg total) by mouth 2 (two) times daily.     Marland Kitchen atorvastatin (LIPITOR) 20 MG tablet Take 20 mg by mouth daily.     Marland Kitchen escitalopram (LEXAPRO) 10 MG tablet Take 10 mg by mouth daily.     . ferrous sulfate 325 (65 FE) MG EC tablet Take 325 mg by mouth daily.      . folic acid (FOLVITE) 1 MG tablet Take 1 mg by mouth daily.     . furosemide (LASIX) 40 MG tablet Take 1 tablet (40 mg total) by mouth daily as needed for fluid (weight gain). 30 tablet 3   . loratadine (CLARITIN) 10 MG tablet Take 10 mg by mouth daily.     . metoprolol succinate (TOPROL-XL) 25 MG 24 hr tablet Take 75 mg by mouth daily.     . Multiple Vitamin (MULTIVITAMIN WITH MINERALS) TABS tablet Take 1 tablet by mouth daily.     . pantoprazole (PROTONIX) 40 MG tablet Take 40 mg by mouth daily.     . potassium chloride SA (KLOR-CON) 20 MEQ tablet Take 2 tablets (40 mEq total) by mouth daily. 90 tablet 3   . tamsulosin (FLOMAX) 0.4 MG CAPS capsule Take 0.4 mg by mouth at bedtime.      . Thiamine HCl (VITAMIN B-1) 250 MG tablet Take 250 mg by mouth daily.     . traZODone (DESYREL) 50 MG tablet Take 50 mg by mouth at bedtime.     . metoprolol succinate (TOPROL XL) 50 MG 24 hr tablet Take 1.5 tablets by mouth orally once a day (Patient taking differently: Take 75 mg by mouth daily. Take 25 mg (3 tablets) to equal 75 mg daily) 135 tablet 2    Facility-Administered Medications Ordered in Other Encounters  Medication Dose Route Frequency Provider Last Rate Last Admin  . metoprolol tartrate (LOPRESSOR) injection    Anesthesia Intra-op Mariea Clonts, CRNA   5 mg at 02/27/19 1318   Allergies  Allergen Reactions  . Penicillins Other (See Comments)    Did it  involve swelling of the face/tongue/throat, SOB, or low BP? Unknown Did it involve sudden or severe rash/hives, skin peeling, or any reaction on the inside of your mouth or nose? Unknown Did you need to seek medical attention at a hospital or doctor's office? Unknown When did it last happen?childhood reaction If all above answers are "NO", may proceed with cephalosporin use.    Social History   Tobacco Use  . Smoking status: Former Research scientist (life sciences)  . Smokeless tobacco: Never Used  Substance Use Topics  . Alcohol use: No    Comment: 03/2016   i HAD ONE DRINK & THAT WAS THE  FIRST DRINK i HAD IN A LONG TIME     Family History  Problem Relation Age of Onset  . Heart disease Mother 50  . Hypertension Mother   . Arthritis Mother   . Heart failure Father 31  . Stroke Maternal Aunt   . Heart failure Sister   . Colon cancer Neg Hx   . Esophageal cancer Neg Hx   . Stomach cancer Neg Hx   . Rectal cancer Neg Hx      Review of Systems  Constitutional: Negative.   HENT: Negative.   Eyes: Negative.   Respiratory: Negative.   Cardiovascular: Negative.   Gastrointestinal: Negative.   Genitourinary: Positive for frequency and urgency.  Musculoskeletal: Positive for joint pain.  Skin: Negative.   Neurological: Negative.   Endo/Heme/Allergies: Negative.   Psychiatric/Behavioral: Negative.      Objective:  Physical Exam  Constitutional: He is oriented to person, place, and time. He appears well-developed.  HENT:  Head: Normocephalic.  Eyes: Pupils are equal, round, and reactive to light.  Neck: No JVD present. No tracheal deviation present. No thyromegaly present.  Cardiovascular: Normal rate, regular rhythm and intact distal pulses.  Respiratory: Effort normal and breath sounds normal. No respiratory distress. He has no wheezes.  GI: Soft. There is no abdominal tenderness. There is no guarding.  Musculoskeletal:     Cervical back: Neck supple.     Right hip: Tenderness and bony  tenderness present. No swelling, deformity or lacerations. Decreased range of motion. Decreased strength.  Lymphadenopathy:    He has no cervical adenopathy.  Neurological: He is alert and oriented to person, place, and time.  Skin: Skin is warm and dry.  Psychiatric: He has a normal mood and affect.      Labs:   Estimated body mass index is 34.18 kg/m as calculated from the following:   Height as of 06/13/19: 6' (1.829 m).   Weight as of 06/13/19: 114.3 kg.   Imaging Review Plain radiographs demonstrate severe degenerative joint disease of the right hip. The bone quality appears to be good for age and reported activity level.      Assessment/Plan:  End stage arthritis, right hip  The patient history, physical examination, clinical judgement of the provider and imaging studies are consistent with end stage degenerative joint disease of the right hip and total hip arthroplasty is deemed medically necessary. The treatment options including medical management, injection therapy, arthroscopy and arthroplasty were discussed at length. The risks and benefits of total hip arthroplasty were presented and reviewed. The risks due to aseptic loosening, infection, stiffness, dislocation/subluxation,  thromboembolic complications and other imponderables were discussed.  The patient acknowledged the explanation, agreed to proceed with the plan and consent was signed. Patient is being admitted for inpatient treatment for surgery, pain control, PT, OT, prophylactic antibiotics, VTE prophylaxis, progressive ambulation and ADL's and discharge planning.The patient is planning to be discharged home.     [place] Antanisha Mohs   PA-C  07/29/2019, 1:19 PM

## 2019-07-31 ENCOUNTER — Other Ambulatory Visit: Payer: Self-pay

## 2019-07-31 ENCOUNTER — Encounter (HOSPITAL_COMMUNITY): Payer: Self-pay

## 2019-07-31 ENCOUNTER — Encounter (HOSPITAL_COMMUNITY)
Admission: RE | Admit: 2019-07-31 | Discharge: 2019-07-31 | Disposition: A | Discharge status: Home / Self Care | Payer: Medicare Other | Source: Ambulatory Visit | Attending: Orthopedic Surgery | Admitting: Orthopedic Surgery

## 2019-07-31 DIAGNOSIS — K219 Gastro-esophageal reflux disease without esophagitis: Secondary | ICD-10-CM | POA: Diagnosis not present

## 2019-07-31 DIAGNOSIS — E785 Hyperlipidemia, unspecified: Secondary | ICD-10-CM | POA: Diagnosis not present

## 2019-07-31 DIAGNOSIS — I110 Hypertensive heart disease with heart failure: Secondary | ICD-10-CM | POA: Diagnosis not present

## 2019-07-31 DIAGNOSIS — M1611 Unilateral primary osteoarthritis, right hip: Secondary | ICD-10-CM | POA: Diagnosis not present

## 2019-07-31 DIAGNOSIS — Z87891 Personal history of nicotine dependence: Secondary | ICD-10-CM | POA: Diagnosis not present

## 2019-07-31 DIAGNOSIS — Z01812 Encounter for preprocedural laboratory examination: Secondary | ICD-10-CM | POA: Insufficient documentation

## 2019-07-31 DIAGNOSIS — Z79899 Other long term (current) drug therapy: Secondary | ICD-10-CM | POA: Insufficient documentation

## 2019-07-31 DIAGNOSIS — F329 Major depressive disorder, single episode, unspecified: Secondary | ICD-10-CM | POA: Diagnosis not present

## 2019-07-31 DIAGNOSIS — Z7901 Long term (current) use of anticoagulants: Secondary | ICD-10-CM | POA: Diagnosis not present

## 2019-07-31 DIAGNOSIS — D649 Anemia, unspecified: Secondary | ICD-10-CM | POA: Insufficient documentation

## 2019-07-31 DIAGNOSIS — I4891 Unspecified atrial fibrillation: Secondary | ICD-10-CM | POA: Diagnosis not present

## 2019-07-31 DIAGNOSIS — I5032 Chronic diastolic (congestive) heart failure: Secondary | ICD-10-CM | POA: Diagnosis not present

## 2019-07-31 HISTORY — DX: Cardiac arrhythmia, unspecified: I49.9

## 2019-07-31 HISTORY — DX: Bradycardia, unspecified: R00.1

## 2019-07-31 HISTORY — DX: Anemia, unspecified: D64.9

## 2019-08-01 ENCOUNTER — Encounter (HOSPITAL_COMMUNITY)
Admission: RE | Admit: 2019-08-01 | Discharge: 2019-08-01 | Disposition: A | Discharge status: Home / Self Care | Payer: Medicare Other | Source: Ambulatory Visit | Attending: Orthopedic Surgery | Admitting: Orthopedic Surgery

## 2019-08-01 ENCOUNTER — Telehealth: Payer: Self-pay | Admitting: *Deleted

## 2019-08-01 DIAGNOSIS — M1611 Unilateral primary osteoarthritis, right hip: Secondary | ICD-10-CM | POA: Diagnosis not present

## 2019-08-01 DIAGNOSIS — K219 Gastro-esophageal reflux disease without esophagitis: Secondary | ICD-10-CM | POA: Diagnosis not present

## 2019-08-01 DIAGNOSIS — I5032 Chronic diastolic (congestive) heart failure: Secondary | ICD-10-CM | POA: Diagnosis not present

## 2019-08-01 DIAGNOSIS — I4891 Unspecified atrial fibrillation: Secondary | ICD-10-CM | POA: Diagnosis not present

## 2019-08-01 DIAGNOSIS — Z01812 Encounter for preprocedural laboratory examination: Secondary | ICD-10-CM | POA: Diagnosis not present

## 2019-08-01 DIAGNOSIS — I110 Hypertensive heart disease with heart failure: Secondary | ICD-10-CM | POA: Diagnosis not present

## 2019-08-01 LAB — BASIC METABOLIC PANEL
Anion gap: 11 (ref 5–15)
BUN: 18 mg/dL (ref 8–23)
CO2: 23 mmol/L (ref 22–32)
Calcium: 9 mg/dL (ref 8.9–10.3)
Chloride: 102 mmol/L (ref 98–111)
Creatinine, Ser: 1.48 mg/dL — ABNORMAL HIGH (ref 0.61–1.24)
GFR calc Af Amer: 52 mL/min — ABNORMAL LOW (ref >60)
GFR calc non Af Amer: 45 mL/min — ABNORMAL LOW (ref >60)
Glucose, Bld: 102 mg/dL — ABNORMAL HIGH (ref 70–99)
Potassium: 4 mmol/L (ref 3.5–5.1)
Sodium: 136 mmol/L (ref 135–145)

## 2019-08-01 LAB — ABO/RH: ABO/RH(D): O POS

## 2019-08-01 LAB — CBC
HCT: 31.8 % — ABNORMAL LOW (ref 39.0–52.0)
Hemoglobin: 10.7 g/dL — ABNORMAL LOW (ref 13.0–17.0)
MCH: 32.2 pg (ref 26.0–34.0)
MCHC: 33.6 g/dL (ref 30.0–36.0)
MCV: 95.8 fL (ref 80.0–100.0)
Platelets: 165 10*3/uL (ref 150–400)
RBC: 3.32 MIL/uL — ABNORMAL LOW (ref 4.22–5.81)
RDW: 14 % (ref 11.5–15.5)
WBC: 6.2 10*3/uL (ref 4.0–10.5)
nRBC: 0 % (ref 0.0–0.2)

## 2019-08-01 LAB — SURGICAL PCR SCREEN
MRSA, PCR: NEGATIVE
Staphylococcus aureus: NEGATIVE

## 2019-08-01 NOTE — Telephone Encounter (Signed)
Patient with diagnosis of afib on Eliquis for anticoagulation.    Procedure: RIGHT TOTAL KNEE ARTHROPLASTY  Date of procedure: 08/06/19  CHADS2-VASc score of  4 (CHF, HTN, AGE, AGE)  CrCl 52 ml/min  Per office protocol, patient can hold Eliquis for 3 days prior to procedure.

## 2019-08-01 NOTE — Telephone Encounter (Signed)
   [facility] Pre-operative Risk Assessment    Request for surgical clearance:  1. What type of surgery is being performed? RIGHT TOTAL KNEE ARTHROPLASTY   2. When is this surgery scheduled? 08/06/19   3. What type of clearance is required (medical clearance vs. Pharmacy clearance to hold med vs. Both)? BOTH  4. Are there any medications that need to be held prior to surgery and how long? ELIQUIS   5. Practice name and name of physician performing surgery? [facility]; DR. Marion   6. What is your office phone number [phone]    7.   What is your office fax number [phone] ATTN: KELLY HANCOCK  8.   Anesthesia type (None, local, MAC, general) ? SPINAL   Julaine Hua 08/01/2019, 3:04 PM  _________________________________________________________________   (provider comments below)

## 2019-08-02 ENCOUNTER — Other Ambulatory Visit (HOSPITAL_COMMUNITY)
Admission: RE | Admit: 2019-08-02 | Discharge: 2019-08-02 | Disposition: A | Discharge status: Home / Self Care | Payer: Medicare Other | Source: Ambulatory Visit | Attending: Orthopedic Surgery | Admitting: Orthopedic Surgery

## 2019-08-02 DIAGNOSIS — Z20822 Contact with and (suspected) exposure to covid-19: Secondary | ICD-10-CM | POA: Diagnosis not present

## 2019-08-02 DIAGNOSIS — Z01812 Encounter for preprocedural laboratory examination: Secondary | ICD-10-CM | POA: Diagnosis not present

## 2019-08-02 LAB — SARS CORONAVIRUS 2 (TAT 6-24 HRS): SARS Coronavirus 2: NEGATIVE

## 2019-08-02 NOTE — Telephone Encounter (Signed)
Pt with hospitalization in 02/2019 with severe anemia, concern for alcohol gastritis. Again hospitalized in 04/2019 with severe bradycardia, HR in 20's, requiring CPR. Likely calcium channel blocker toxicity in the setting of ETOH and high grapefruit ingestion. Followed by EP and has been doing well.  I called pt to update current status. Left VM to call back to preop clinic.  He has an office visit scheulde with Dr. Caryl Comes on 08/12/19. Surgery is scheduled for 3/30

## 2019-08-05 ENCOUNTER — Encounter (HOSPITAL_COMMUNITY): Payer: Self-pay | Admitting: Certified Registered Nurse Anesthetist

## 2019-08-05 MED ORDER — VANCOMYCIN HCL 1500 MG/300ML IV SOLN
1500.0000 mg | INTRAVENOUS | Status: DC
  Filled 2019-08-05: qty 300

## 2019-08-05 MED ORDER — GENTAMICIN SULFATE 40 MG/ML IJ SOLN
5.0000 mg/kg | INTRAVENOUS | Status: DC
  Filled 2019-08-05: qty 11

## 2019-08-05 NOTE — Telephone Encounter (Signed)
Routed over to the requesting surgeon's office to make them aware.

## 2019-08-05 NOTE — Progress Notes (Signed)
Anesthesia Chart Review   Case: [id number] Date/Time: 08/06/19 1115   Procedure: TOTAL HIP ARTHROPLASTY ANTERIOR APPROACH (Right Hip) - 70 mins   Anesthesia type: Spinal   Pre-op diagnosis: Right hip osteoarthritis   Location: [facility] ROOM 10 / [facility]   Surgeons: Paralee Cancel, MD      DISCUSSION:78 y.o. former smoker with h/o HTN, HLD, GERD, atrial fibrillation (Eliquis), chronic diastolic  right hip OA scheduled for above procedure 08/06/19 with Dr. Paralee Cancel.   Cleared by cardiology 08/05/19.  Per OV note, "Chart reviewed as part of pre-operative protocol coverage. Patient was contacted 08/05/2019 in reference to pre-operative risk assessment for pending surgery as outlined below.  Darryl Diaz was last seen on 06/13/19 by Kathyrn Drown, NP for PAF, HTN and chronic diastolic Heart Failure. He was stable at that time and today he tells me he has no chest pain or SOB but severe knee pain and he needs surgery.  No cardiac issues since last visit. Since that day, Darryl Diaz has done well.  Pt may hold eliquis for 3 days prior to procedure which he has done.      Therefore, based on ACC/AHA guidelines, the patient would be at acceptable risk for the planned procedure without further cardiovascular testing."  Anticipate pt can proceed with planned procedure barring acute status change.   VS: BP 128/60   Pulse 64   Temp 36.8 C (Oral)   Resp 16   Ht 6' (1.829 m)   Wt 104.8 kg   SpO2 98%   BMI 31.33 kg/m   PROVIDERS: Tisovec, [name], MD is PCP   Sherren Mocha, MD is Cardiologist  LABS: Labs reviewed: Acceptable for surgery. (all labs ordered are listed, but only abnormal results are displayed)  Labs Reviewed  BASIC METABOLIC PANEL - Abnormal; Notable for the following components:      Result Value   Glucose, Bld 102 (*)    Creatinine, Ser 1.48 (*)    GFR calc non Af Amer 45 (*)    GFR calc Af Amer 52 (*)    All other components within normal limits  CBC - Abnormal; Notable  for the following components:   RBC 3.32 (*)    Hemoglobin 10.7 (*)    HCT 31.8 (*)    All other components within normal limits  SURGICAL PCR SCREEN  TYPE AND SCREEN  ABO/RH     IMAGES:   EKG: 04/22/2019 Rate 80 bpm Normal sinus rhythm  Left axis deviation Inferior infarct, age undetermined  No significant change since last tracing   CV: Echo 04/21/2019 IMPRESSIONS    1. The left ventricle has no regional wall motion abnormalities.  2. LVEF is 60-65%.  3. Left atrial size was severely dilated.  4. The mitral valve was not assessed. not assessed mitral valve  regurgitation. No evidence of mitral stenosis.  5. The tricuspid valve is normal in structure. Tricuspid valve  regurgitation is trivial.  6. The pulmonic valve was not well visualized. Pulmonic valve  regurgitation not assessed.  7. Limited echo s/p cardiac arrest  8. Not assessed.  9. The aortic valve was not assessed. Aortic valve regurgitation not  assessed.  10. Left ventricular ejection fraction, by visual estimation, is 60 to  65%. The left ventricle has normal function. There is not assseded.  11. Global right ventricle has normal systolic function.The right  ventricular size is not assessed. Right vetricular wall thickness was not  assessed Past Medical History:  Diagnosis Date  .  Alcoholism ([place])    in remission following wife's death  . Anemia   . Atrial fibrillation ([place])   . Bradycardia    low 20's  . Dysrhythmia   . Fall at home 03/28/2016  . GERD (gastroesophageal reflux disease)   . H/O seasonal allergies   . Hx of adenomatous colonic polyps 08/10/2017  . Hyperlipidemia   . Hypertension   . Insomnia   . 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 OA (osteoarthritis)    hands    Past Surgical History:  Procedure Laterality Date  . ANKLE SURGERY Left 2014   had rods put in   . BIOPSY  02/28/2019   Procedure: BIOPSY;  Surgeon: Milus Banister, MD;  Location:  [facility] ENDOSCOPY;  Service: Endoscopy;;  . [place]  . ESOPHAGOGASTRODUODENOSCOPY (EGD) WITH PROPOFOL N/A 02/28/2019   Procedure: ESOPHAGOGASTRODUODENOSCOPY (EGD) WITH PROPOFOL;  Surgeon: Milus Banister, MD;  Location: [facility] ENDOSCOPY;  Service: Endoscopy;  Laterality: N/A;  . HIP ARTHROPLASTY Left 04/01/2016   Procedure: ARTHROPLASTY BIPOLAR HIP (HEMIARTHROPLASTY);  Surgeon: Paralee Cancel, MD;  Location: [place];  Service: Orthopedics;  Laterality: Left;  . HIP CLOSED REDUCTION Left 04/30/2016   Procedure: CLOSED REDUCTION HIP;  Surgeon: Wylene Simmer, MD;  Location: [place];  Service: Orthopedics;  Laterality: Left;  . TONSILLECTOMY      MEDICATIONS: . acetaminophen (TYLENOL) 500 MG tablet  . apixaban (ELIQUIS) 5 MG TABS tablet  . atorvastatin (LIPITOR) 20 MG tablet  . escitalopram (LEXAPRO) 10 MG tablet  . ferrous sulfate 325 (65 FE) MG EC tablet  . folic acid (FOLVITE) 1 MG tablet  . furosemide (LASIX) 40 MG tablet  . loratadine (CLARITIN) 10 MG tablet  . metoprolol succinate (TOPROL XL) 50 MG 24 hr tablet  . metoprolol succinate (TOPROL-XL) 25 MG 24 hr tablet  . Multiple Vitamin (MULTIVITAMIN WITH MINERALS) TABS tablet  . pantoprazole (PROTONIX) 40 MG tablet  . potassium chloride SA (KLOR-CON) 20 MEQ tablet  . tamsulosin (FLOMAX) 0.4 MG CAPS capsule  . Thiamine HCl (VITAMIN B-1) 250 MG tablet  . traZODone (DESYREL) 50 MG tablet   No current facility-administered medications for this encounter.   Derrill Memo ON 08/06/2019] gentamicin (GARAMYCIN) 440 mg in dextrose 5 % 100 mL IVPB  . metoprolol tartrate (LOPRESSOR) injection  . [START ON 08/06/2019] vancomycin (VANCOREADY) IVPB 1500 mg/300 mL     Maia Plan [facility] Pre-Surgical Testing [phone] 08/05/19  1:56 PM

## 2019-08-05 NOTE — Telephone Encounter (Signed)
Please let Darryl Diaz with [facility] know that we cannot reach pt to give cardiac clearance.  He has not returned calls.

## 2019-08-05 NOTE — Telephone Encounter (Signed)
   Primary Cardiologist: Sherren Mocha, MD  Chart reviewed as part of pre-operative protocol coverage. Patient was contacted 08/05/2019 in reference to pre-operative risk assessment for pending surgery as outlined below.  Darryl Diaz was last seen on 06/13/19 by Kathyrn Drown, NP for PAF, HTN and chronic diastolic Heart Failure. He was stable at that time and today he tells me he has no chest pain or SOB but severe knee pain and he needs surgery.  No cardiac issues since last visit. Since that day, Darryl Diaz has done well.  Pt may hold eliquis for 3 days prior to procedure which he has done.      Therefore, based on ACC/AHA guidelines, the patient would be at acceptable risk for the planned procedure without further cardiovascular testing.   I will route this recommendation to the requesting party via Epic fax function and remove from pre-op pool.  Please call with questions.  Cecilie Kicks, NP 08/05/2019, 12:06 PM

## 2019-08-06 ENCOUNTER — Encounter (HOSPITAL_COMMUNITY)
Admission: RE | Disposition: A | Discharge status: Home / Self Care | Payer: Self-pay | Source: Home / Self Care | Attending: Orthopedic Surgery

## 2019-08-06 ENCOUNTER — Ambulatory Visit (HOSPITAL_COMMUNITY)
Admission: RE | Admit: 2019-08-06 | Discharge: 2019-08-06 | Disposition: A | Discharge status: Home / Self Care | Payer: Medicare Other | Attending: Orthopedic Surgery | Admitting: Orthopedic Surgery

## 2019-08-06 ENCOUNTER — Encounter (HOSPITAL_COMMUNITY): Payer: Self-pay | Admitting: Orthopedic Surgery

## 2019-08-06 LAB — TYPE AND SCREEN
ABO/RH(D): O POS
Antibody Screen: NEGATIVE

## 2019-08-06 SURGERY — ARTHROPLASTY, HIP, TOTAL, ANTERIOR APPROACH
Anesthesia: Spinal | Site: Hip | Laterality: Right

## 2019-08-06 MED ORDER — LACTATED RINGERS IV SOLN: INTRAVENOUS | Status: DC

## 2019-08-06 MED ORDER — DEXAMETHASONE SODIUM PHOSPHATE 10 MG/ML IJ SOLN: 10.0000 mg | Freq: Once | INTRAMUSCULAR | Status: DC

## 2019-08-06 MED ORDER — CHLORHEXIDINE GLUCONATE 4 % EX LIQD: 60.0000 mL | Freq: Once | CUTANEOUS | Status: DC

## 2019-08-06 MED ORDER — TRANEXAMIC ACID-NACL 1000-0.7 MG/100ML-% IV SOLN
1000.0000 mg | INTRAVENOUS | Status: DC
  Filled 2019-08-06: qty 100

## 2019-08-06 NOTE — Anesthesia Preprocedure Evaluation (Deleted)
Anesthesia Evaluation  Patient identified by MRN, date of birth, ID band Patient awake    Reviewed: Allergy & Precautions, NPO status , Patient's Chart, lab work & pertinent test results, reviewed documented beta blocker date and time   Airway Mallampati: II  TM Distance: >3 FB Neck ROM: Full    Dental  (+) Teeth Intact, Caps   Pulmonary former smoker,    Pulmonary exam normal breath sounds clear to auscultation       Cardiovascular hypertension, Pt. on medications and Pt. on home beta blockers + Past MI  Normal cardiovascular exam+ dysrhythmias Atrial Fibrillation  Rhythm:Regular Rate:Normal  EKG 04/22/19- NSR, LAD, inferior infarct  Echo 04/21/19 1. The left ventricle has no regional wall motion abnormalities.  2. LVEF is 60-65%.  3. Left atrial size was severely dilated.  4. The mitral valve was not assessed. not assessed mitral valve regurgitation. No evidence of mitral stenosis.  5. The tricuspid valve is normal in structure. Tricuspid valve regurgitation is trivial.  6. The pulmonic valve was not well visualized. Pulmonic valve  regurgitation not assessed.  7. Limited echo s/p cardiac arrest  8. Not assessed.  9. The aortic valve was not assessed. Aortic valve regurgitation not  assessed.  10. Left ventricular ejection fraction, by visual estimation, is 60 to 65%. The left ventricle has normal function. There is not assseded.  11. Global right ventricle has normal systolic function.The right ventricular size is not assessed. Right vetricular wall thickness was not  assessed.    Neuro/Psych PSYCHIATRIC DISORDERS Depression  Neuromuscular disease    GI/Hepatic Neg liver ROS, GERD  Controlled and Medicated,  Endo/Other  Hyperlipidemia Obesity  Renal/GU negative Renal ROS  negative genitourinary   Musculoskeletal  (+) Arthritis , Osteoarthritis,  Right Hip OA   Abdominal (+) + obese,   Peds   Hematology  (+) anemia , Eliquis therapy- last dose   Anesthesia Other Findings Generalized maculopapular rash  Reproductive/Obstetrics                             Anesthesia Physical Anesthesia Plan  ASA: III  Anesthesia Plan: Spinal   Post-op Pain Management:    Induction:   PONV Risk Score and Plan: 2 and Ondansetron, Propofol infusion and Treatment may vary due to age or medical condition  Airway Management Planned: Natural Airway, Simple Face Mask and Nasal Cannula  Additional Equipment:   Intra-op Plan:   Post-operative Plan:   Informed Consent: I have reviewed the patients History and Physical, chart, labs and discussed the procedure including the risks, benefits and alternatives for the proposed anesthesia with the patient or authorized representative who has indicated his/her understanding and acceptance.       Plan Discussed with: CRNA and Surgeon  Anesthesia Plan Comments: (Case cancelled due to rash)       Anesthesia Quick Evaluation

## 2019-08-06 NOTE — Progress Notes (Signed)
Upon arrival and prework- noticed rash all over both arms and upper chest; also with huge reddened area in right groin- with some weeping. Pt states the rash came after his 2nd COVID vaccination.    Contacted Dr. Alvan Dame.   Dr Alvan Dame in to see and decision to cancel surgery and reschedule.   Dr. Alvan Dame requested [name] to contact his PCP and inform him of the possible Yeast infection (right groin) and rash on body for treatment.

## 2019-08-12 ENCOUNTER — Other Ambulatory Visit: Payer: Self-pay

## 2019-08-12 ENCOUNTER — Ambulatory Visit (INDEPENDENT_AMBULATORY_CARE_PROVIDER_SITE_OTHER): Payer: Medicare Other | Admitting: Internal Medicine

## 2019-08-12 ENCOUNTER — Encounter: Payer: Self-pay | Admitting: Internal Medicine

## 2019-08-12 VITALS — BP 120/70 | HR 68 | Ht 72.0 in | Wt 236.0 lb

## 2019-08-12 DIAGNOSIS — I480 Paroxysmal atrial fibrillation: Secondary | ICD-10-CM

## 2019-08-12 DIAGNOSIS — R001 Bradycardia, unspecified: Secondary | ICD-10-CM

## 2019-08-12 NOTE — Patient Instructions (Signed)
Medication Instructions:  ?Your physician recommends that you continue on your current medications as directed. Please refer to the Current Medication list given to you today. ? ?*If you need a refill on your cardiac medications before your next appointment, please call your pharmacy* ? ? ?Lab Work: ?None ordered. ? ?If you have labs (blood work) drawn today and your tests are completely normal, you will receive your results only by: ?MyChart Message (if you have MyChart) OR ?A paper copy in the mail ?If you have any lab test that is abnormal or we need to change your treatment, we will call you to review the results. ? ? ?Testing/Procedures: ?None ordered. ? ? ? ?Follow-Up: ?At [facility], you and your health needs are our priority.  As part of our continuing mission to provide you with exceptional heart care, we have created designated Provider Care Teams.  These Care Teams include your primary Cardiologist (physician) and Advanced Practice Providers (APPs -  Physician Assistants and Nurse Practitioners) who all work together to provide you with the care you need, when you need it. ? ?We recommend signing up for the patient portal called "MyChart".  Sign up information is provided on this After Visit Summary.  MyChart is used to connect with patients for Virtual Visits (Telemedicine).  Patients are able to view lab/test results, encounter notes, upcoming appointments, etc.  Non-urgent messages can be sent to your provider as well.   ?To learn more about what you can do with MyChart, go to [email or web address].   ? ?Your next appointment:   ? Follow up with Dr Klein as needed ?

## 2019-08-12 NOTE — Progress Notes (Signed)
Patient Care Team: Tisovec, [name], MD as PCP - General (Internal Medicine) Sherren Mocha, MD as PCP - Cardiology (Cardiology) Tobi Bastos, RN as [place] Management   HPI  Darryl Diaz is a 79 y.o. male seen in follow-up after telehealth consultation for bradycardia and syncope with history of paroxysmal atrial fibrillation.  He is a retired professor of food and beverages from the [employer]    Initially followed at [facility] and treated with [place]; did not recall symptoms with atrial fibrillation   Hx of recurrent GI bleeds, 10/20 with MW tear and course cx by AFib RVR, Rx with amio and CCB ( dilt 360) and resumption of anticoagulation; had previously been on metoprolol 200 mg daily    Syncope while talking to his sister-she said August determine is too far away for pneumothorax hospitalized 12/20 with weakness and HR in 20>> underwent CPR ,K 5.9--Given atropine, Ca,Insulin D50, and epi drip-- reviewed ED notes  NaHCO3 Amio BB (metoprolol 200)  and CCB(dilt 360)     He was an avid drinker of grapefruit juice.  Because me to wonder as to whether he had developed calcium blocker toxicity.  No longer drinking grapefruit juice.  No longer on calcium blockers.  Taking beta-blockers.  No interval atrial fibrillation history of which he is aware  Marked limitations in exercise tolerance.  Gets around with a walker but says his dyspnea at this point is less.  He has lost 20 pounds.  He does have edema.  No nocturnal dyspnea or orthopnea.  Denies exertional chest discomfort.  DATE TEST EF   12/20 Echo   65 % LAE ( 57.71ml/m2)        Date Cr K Hgb  3/21 1.48 4.0 10.7         History of anemia.   Thromboembolic risk factors ( age -49,  HTN- 1) for a CHADSVASc Score of 3    Past Medical History:  Diagnosis Date  . 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 Anemia   . Atrial fibrillation ([place])   .  Bradycardia    low 20's  . Dysrhythmia   . Fall at home 03/28/2016  . GERD (gastroesophageal reflux disease)   . H/O seasonal allergies   . Hx of adenomatous colonic polyps 08/10/2017  . Hyperlipidemia   . Hypertension   . Insomnia   . 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 MI (myocardial infarction) ([place]) 04/2019   had CPR by EMS  . OA (osteoarthritis)    hands    Past Surgical History:  Procedure Laterality Date  . ANKLE SURGERY Left 2014   had rods put in   . BIOPSY  02/28/2019   Procedure: BIOPSY;  Surgeon: Milus Banister, MD;  Location: [facility] ENDOSCOPY;  Service: Endoscopy;;  . [place]  . ESOPHAGOGASTRODUODENOSCOPY (EGD) WITH PROPOFOL N/A 02/28/2019   Procedure: ESOPHAGOGASTRODUODENOSCOPY (EGD) WITH PROPOFOL;  Surgeon: Milus Banister, MD;  [facility];  Service: Endoscopy;  Laterality: N/A;  . HIP ARTHROPLASTY Left 04/01/2016   Procedure: ARTHROPLASTY BIPOLAR HIP (HEMIARTHROPLASTY);  Surgeon: Paralee Cancel, MD;  Location: [place];  Service: Orthopedics;  Laterality: Left;  . HIP CLOSED REDUCTION Left 04/30/2016   Procedure: CLOSED REDUCTION HIP;  Surgeon: Wylene Simmer, MD;  Location: [place];  Service: Orthopedics;  Laterality: Left;  . TONSILLECTOMY  Current Outpatient Medications  Medication Sig Dispense Refill  . acetaminophen (TYLENOL) 500 MG tablet Take 1,000 mg by mouth every 6 (six) hours as needed for headache (pain).    Marland Kitchen apixaban (ELIQUIS) 5 MG TABS tablet Take 1 tablet (5 mg total) by mouth 2 (two) times daily.    Marland Kitchen atorvastatin (LIPITOR) 20 MG tablet Take 20 mg by mouth daily.    Marland Kitchen escitalopram (LEXAPRO) 10 MG tablet Take 10 mg by mouth daily.    . ferrous sulfate 325 (65 FE) MG EC tablet Take 325 mg by mouth daily.     . folic acid (FOLVITE) 1 MG tablet Take 1 mg by mouth daily.    . furosemide (LASIX) 40 MG tablet Take 1 tablet (40 mg total) by mouth daily as needed for fluid (weight gain). 30 tablet 3  .  loratadine (CLARITIN) 10 MG tablet Take 10 mg by mouth daily.    . metoprolol succinate (TOPROL XL) 50 MG 24 hr tablet Take 1.5 tablets by mouth orally once a day (Patient taking differently: Take 75 mg by mouth daily. Take 25 mg (3 tablets) to equal 75 mg daily) 135 tablet 2  . metoprolol succinate (TOPROL-XL) 25 MG 24 hr tablet Take 75 mg by mouth daily.    . Multiple Vitamin (MULTIVITAMIN WITH MINERALS) TABS tablet Take 1 tablet by mouth daily.    . pantoprazole (PROTONIX) 40 MG tablet Take 40 mg by mouth daily.    . potassium chloride SA (KLOR-CON) 20 MEQ tablet Take 2 tablets (40 mEq total) by mouth daily. 90 tablet 3  . tamsulosin (FLOMAX) 0.4 MG CAPS capsule Take 0.4 mg by mouth at bedtime.     . Thiamine HCl (VITAMIN B-1) 250 MG tablet Take 250 mg by mouth daily.    . traZODone (DESYREL) 50 MG tablet Take 50 mg by mouth at bedtime.     No current facility-administered medications for this visit.   Facility-Administered Medications Ordered in Other Visits  Medication Dose Route Frequency Provider Last Rate Last Admin  . metoprolol tartrate (LOPRESSOR) injection    Anesthesia Intra-op Mariea Clonts, CRNA   5 mg at 02/27/19 1318    Allergies:   Penicillins   Social History:  The patient  reports that he has quit smoking. His smoking use included cigarettes. He has never used smokeless tobacco. He reports that he does not drink alcohol or use drugs.   Family History:  The patient's   family history includes Arthritis in his mother; Heart disease (age of onset: 14) in his mother; Heart failure in his sister; Heart failure (age of onset: 1) in his father; Hypertension in his mother; Stroke in his maternal aunt.   ROS:  Please see the history of present illness.   All other systems are personally reviewed and negative.    Exam:     BP 120/70   Pulse 68   Ht 6' (1.829 m)   Wt 236 lb (107 kg)   SpO2 96%   BMI 32.01 kg/m  Well developed and nourished in no acute distress HENT  normal Neck supple with JVP  Clear Regular rate and rhythm, no murmurs or gallops Abd-soft with active BS No Clubbing cyanosis 2+ edema Skin-warm and dry A & Oriented  Grossly normal sensory and motor function  ECG sinus at 68 Interval 17/09/40 relatively normal apart from an early R wave in lead V2 question lead placement    ECG Personally reviewed  02/27/19 >> AFib 130  03/20/19  ECG personally reviewed NSR 84 04/20/19 ECG personally reviewed Sinus ( 12 bpm) with junction escape about 30  12/13 ECG personally reviewed Sinus @ 89  ASSESSMENT & PLAN:    Atrial fibrillation-paroxysmal rapid ventricular response-secondary to GI bleed  Sinus bradycardia   Syncope  Hypertension  HFpEF   Preoperative consult   No further bradycardia or syncope of which he is aware now off calcium blockers and grapefruit juice.  Volume overloaded.  Reviewing his response to diuretics, has been poor.  Hence, having reviewed the threshold effect of his furosemide we will increase it from 20--40 and with his daughter have recommended that we increase it as necessary until we find the diuretic benefit and then will take it for 4 doses done every other day.  On Anticoagulation;  No bleeding issues   BP well controlled on current doses of BB, no interval atrial fibrillation of which he is aware.  He is hoping to undergo hip surgery with Dr. Alvan Dame in the near future.  From a cardiac perspective his risks should be acceptable.  I would not be surprised if he had postoperative atrial fibrillation.  Restraint in medications would be appropriate and earlier rather than later cardioversion would be likely beneficial to avoid excessive medications for persistence of atrial fibrillation   Signed, Virl Axe, MD  08/12/2019 9:51 AM     [facility] [address] [place] [address] [phone] (office) [phone] (fax)

## 2019-08-14 DIAGNOSIS — M25551 Pain in right hip: Secondary | ICD-10-CM | POA: Diagnosis not present

## 2019-08-14 DIAGNOSIS — M1611 Unilateral primary osteoarthritis, right hip: Secondary | ICD-10-CM | POA: Diagnosis not present

## 2019-08-19 DIAGNOSIS — M1611 Unilateral primary osteoarthritis, right hip: Secondary | ICD-10-CM | POA: Diagnosis present

## 2019-08-23 NOTE — Patient Instructions (Addendum)
DUE TO COVID-19 ONLY ONE VISITOR IS ALLOWED TO COME WITH YOU AND STAY IN THE WAITING ROOM ONLY DURING PRE OP AND PROCEDURE DAY OF SURGERY. THE 2 VISITORS MAY VISIT WITH YOU AFTER SURGERY IN YOUR PRIVATE ROOM DURING VISITING HOURS ONLY!  YOU NEED TO HAVE A COVID 19 TEST ON___4/23____ @_10 :15______, THIS TEST MUST BE DONE BEFORE SURGERY, COME  Darryl Diaz , [id number].  (Darryl Diaz) ONCE YOUR COVID TEST IS COMPLETED, PLEASE BEGIN THE QUARANTINE INSTRUCTIONS AS OUTLINED IN YOUR HANDOUT.                Darryl Diaz    Your procedure is scheduled on: 09/03/19   Report to [facility] Main  Entrance   Report to admitting at   9:00 AM     Call this number if you have problems the morning of surgery [place], NO CHEWING GUM [place].   Do not eat food After Midnight.   YOU MAY HAVE CLEAR LIQUIDS FROM MIDNIGHT UNTIL 8:30 AM.    CLEAR LIQUID DIET   Foods Allowed                                                                     Foods Excluded  Coffee and tea, regular and decaf                             liquids that you cannot  Plain Jell-O any favor except red or purple                                           see through such as: Fruit ices (not with fruit pulp)                                     milk, soups, orange juice  Iced Popsicles                                    All solid food Carbonated beverages, regular and diet                                    Cranberry, grape and apple juices Sports drinks like Gatorade Lightly seasoned clear broth or consume(fat free) Sugar, honey syrup   At 8:30 AM Please finish the prescribed Pre-Surgery  Drink.   Nothing by mouth after you finish the Gatorade drink !    Take these medicines the morning of surgery with A SIP OF WATER: Lexapro, Trazadone, Claritin, Metoprolol, Tamsulosin, Protonix                            You may not have any metal on your body including  piercings  Do not wear jewelry,  lotions, powders or  deodorant                       Men may shave face and neck.   Do not bring valuables to the hospital. [place].  Contacts, dentures or bridgework may not be worn into surgery.      Patients discharged the day of surgery will not be allowed to drive home.  IF YOU ARE HAVING SURGERY AND GOING HOME THE SAME DAY, YOU MUST HAVE AN ADULT TO DRIVE YOU HOME AND BE WITH YOU FOR 24 HOURS  . YOU MAY GO HOME BY TAXI OR UBER OR ORTHERWISE, BUT AN ADULT MUST ACCOMPANY YOU HOME AND STAY WITH YOU FOR 24 HOURS.  Name and phone number of your driver:  Special Instructions: N/A              Please read over the following fact sheets you were given: _____________________________________________________________________             [facility] - Preparing for Surgery Before surgery, you can play an important role.   Because skin is not sterile, your skin needs to be as free of germs as possible.   You can reduce the number of germs on your skin by washing with CHG (chlorahexidine gluconate) soap before surgery.   CHG is an antiseptic cleaner which kills germs and bonds with the skin to continue killing germs even after washing. Please DO NOT use if you have an allergy to CHG or antibacterial soaps.   If your skin becomes reddened/irritated stop using the CHG and inform your nurse when you arrive at Short Stay.   You may shave your face/neck.  Please follow these instructions carefully:  1.  Shower with CHG Soap the night before surgery and the  morning of Surgery.  2.  If you choose to wash your hair, wash your hair first as usual with your  normal  shampoo.  3.  After you shampoo, rinse your hair and body thoroughly to remove the  shampoo.                                        4.  Use CHG as you would any other liquid soap.  You  can apply chg directly  to the skin and wash                       Gently with a scrungie or clean washcloth.  5.  Apply the CHG Soap to your body ONLY FROM THE NECK DOWN.   Do not use on face/ open                           Wound or open sores. Avoid contact with eyes, ears mouth and genitals (private parts).                       Wash face,  Genitals (private parts) with your normal soap.             6.  Wash thoroughly, paying special attention to the area where your surgery  will be performed.  7.  Thoroughly  rinse your body with warm water from the neck down.  8.  DO NOT shower/wash with your normal soap after using and rinsing off  the CHG Soap.             9.  Pat yourself dry with a clean towel.            10.  Wear clean pajamas.            11.  Place clean sheets on your bed the night of your first shower and do not  sleep with pets. Day of Surgery : Do not apply any lotions/deodorants the morning of surgery.  Please wear clean clothes to the hospital/surgery center.  FAILURE TO FOLLOW THESE INSTRUCTIONS MAY RESULT IN THE CANCELLATION OF YOUR SURGERY PATIENT SIGNATURE_________________________________  NURSE SIGNATURE__________________________________  ________________________________________________________________________   Darryl Diaz  An incentive spirometer is a tool that can help keep your lungs clear and active. This tool measures how well you are filling your lungs with each breath. Taking long deep breaths may help reverse or decrease the chance of developing breathing (pulmonary) problems (especially infection) following:  A long period of time when you are unable to move or be active. BEFORE THE PROCEDURE   If the spirometer includes an indicator to show your best effort, your nurse or respiratory therapist will set it to a desired goal.  If possible, sit up straight or lean slightly forward. Try not to slouch.  Hold the incentive spirometer in an upright  position. INSTRUCTIONS FOR USE  1. Sit on the edge of your bed if possible, or sit up as far as you can in bed or on a chair. 2. Hold the incentive spirometer in an upright position. 3. Breathe out normally. 4. Place the mouthpiece in your mouth and seal your lips tightly around it. 5. Breathe in slowly and as deeply as possible, raising the piston or the ball toward the top of the column. 6. Hold your breath for 3-5 seconds or for as long as possible. Allow the piston or ball to fall to the bottom of the column. 7. Remove the mouthpiece from your mouth and breathe out normally. 8. Rest for a few seconds and repeat Steps 1 through 7 at least 10 times every 1-2 hours when you are awake. Take your time and take a few normal breaths between deep breaths. 9. The spirometer may include an indicator to show your best effort. Use the indicator as a goal to work toward during each repetition. 10. After each set of 10 deep breaths, practice coughing to be sure your lungs are clear. If you have an incision (the cut made at the time of surgery), support your incision when coughing by placing a pillow or rolled up towels firmly against it. Once you are able to get out of bed, walk around indoors and cough well. You may stop using the incentive spirometer when instructed by your caregiver.  RISKS AND COMPLICATIONS  Take your time so you do not get dizzy or light-headed.  If you are in pain, you may need to take or ask for pain medication before doing incentive spirometry. It is harder to take a deep breath if you are having pain. AFTER USE  Rest and breathe slowly and easily.  It can be helpful to keep track of a log of your progress. Your caregiver can provide you with a simple table to help with this. If you are using the spirometer at home, follow these  instructions: SEEK MEDICAL CARE IF:   You are having difficultly using the spirometer.  You have trouble using the spirometer as often as  instructed.  Your pain medication is not giving enough relief while using the spirometer.  You develop fever of 100.5 F (38.1 C) or higher. SEEK IMMEDIATE MEDICAL CARE IF:   You cough up bloody sputum that had not been present before.  You develop fever of 102 F (38.9 C) or greater.  You develop worsening pain at or near the incision site. MAKE SURE YOU:   Understand these instructions.  Will watch your condition.  Will get help right away if you are not doing well or get worse. Document Released: 09/05/2006 Document Revised: 07/18/2011 Document Reviewed: 11/06/2006 [facility] Patient Information 2014 [place], [place].   ________________________________________________________________________

## 2019-08-26 ENCOUNTER — Other Ambulatory Visit: Payer: Self-pay

## 2019-08-26 ENCOUNTER — Encounter (HOSPITAL_COMMUNITY)
Admission: RE | Admit: 2019-08-26 | Discharge: 2019-08-26 | Disposition: A | Discharge status: Home / Self Care | Payer: Medicare Other | Source: Ambulatory Visit | Attending: Orthopedic Surgery | Admitting: Orthopedic Surgery

## 2019-08-26 DIAGNOSIS — Z01812 Encounter for preprocedural laboratory examination: Secondary | ICD-10-CM | POA: Insufficient documentation

## 2019-08-26 MED ORDER — METOPROLOL SUCCINATE ER 25 MG PO TB24: 75.0000 mg | ORAL_TABLET | Freq: Every day | ORAL | 3 refills | Status: DC

## 2019-08-26 NOTE — Progress Notes (Signed)
PCP - Dr. Alfonso Patten. [place] Cardiologist - Dr. Ezzie Dural  Chest x-ray - 04/22/19 EKG - 08/12/19 Stress Test - 12/20 ECHO - 04/21/19 Cardiac Cath - no  Sleep Study - yes CPAP - no. Wt loss no longer needs it  Fasting Blood Sugar - NA Checks Blood Sugar _____ times a day  Blood Thinner Instructions:Eliquis Aspirin Instructions:stop 3 days prior to DOS Last Dose:08/30/19  Anesthesia review:   Patient denies shortness of breath, fever, cough and chest pain at PAT appointment yes  Patient verbalized understanding of instructions that were given to them at the PAT appointment. Patient was also instructed that they will need to review over the PAT instructions again at home before surgery. yes

## 2019-08-26 NOTE — Telephone Encounter (Signed)
Pt's medication was sent to pt's pharmacy as requested. Confirmation received.  °

## 2019-08-28 ENCOUNTER — Other Ambulatory Visit: Payer: Self-pay

## 2019-08-28 ENCOUNTER — Encounter (HOSPITAL_COMMUNITY)
Admission: RE | Admit: 2019-08-28 | Discharge: 2019-08-28 | Disposition: A | Discharge status: Home / Self Care | Payer: Medicare Other | Source: Ambulatory Visit | Attending: Orthopedic Surgery | Admitting: Orthopedic Surgery

## 2019-08-28 DIAGNOSIS — Z01812 Encounter for preprocedural laboratory examination: Secondary | ICD-10-CM | POA: Diagnosis not present

## 2019-08-28 LAB — BASIC METABOLIC PANEL
Anion gap: 11 (ref 5–15)
BUN: 20 mg/dL (ref 8–23)
CO2: 22 mmol/L (ref 22–32)
Calcium: 9 mg/dL (ref 8.9–10.3)
Chloride: 102 mmol/L (ref 98–111)
Creatinine, Ser: 1.48 mg/dL — ABNORMAL HIGH (ref 0.61–1.24)
GFR calc Af Amer: 52 mL/min — ABNORMAL LOW (ref >60)
GFR calc non Af Amer: 45 mL/min — ABNORMAL LOW (ref >60)
Glucose, Bld: 106 mg/dL — ABNORMAL HIGH (ref 70–99)
Potassium: 3.8 mmol/L (ref 3.5–5.1)
Sodium: 135 mmol/L (ref 135–145)

## 2019-08-28 LAB — CBC
HCT: 29.6 % — ABNORMAL LOW (ref 39.0–52.0)
Hemoglobin: 9.8 g/dL — ABNORMAL LOW (ref 13.0–17.0)
MCH: 32.8 pg (ref 26.0–34.0)
MCHC: 33.1 g/dL (ref 30.0–36.0)
MCV: 99 fL (ref 80.0–100.0)
Platelets: 184 10*3/uL (ref 150–400)
RBC: 2.99 MIL/uL — ABNORMAL LOW (ref 4.22–5.81)
RDW: 15.9 % — ABNORMAL HIGH (ref 11.5–15.5)
WBC: 6 10*3/uL (ref 4.0–10.5)
nRBC: 0 % (ref 0.0–0.2)

## 2019-08-28 LAB — SURGICAL PCR SCREEN
MRSA, PCR: NEGATIVE
Staphylococcus aureus: NEGATIVE

## 2019-08-30 ENCOUNTER — Other Ambulatory Visit (HOSPITAL_COMMUNITY)
Admission: RE | Admit: 2019-08-30 | Discharge: 2019-08-30 | Disposition: A | Discharge status: Home / Self Care | Payer: Medicare Other | Source: Ambulatory Visit | Attending: Orthopedic Surgery | Admitting: Orthopedic Surgery

## 2019-08-30 DIAGNOSIS — Z01812 Encounter for preprocedural laboratory examination: Secondary | ICD-10-CM | POA: Diagnosis not present

## 2019-08-30 DIAGNOSIS — M1611 Unilateral primary osteoarthritis, right hip: Secondary | ICD-10-CM | POA: Diagnosis not present

## 2019-08-30 DIAGNOSIS — Z20822 Contact with and (suspected) exposure to covid-19: Secondary | ICD-10-CM | POA: Insufficient documentation

## 2019-08-30 DIAGNOSIS — M25551 Pain in right hip: Secondary | ICD-10-CM | POA: Diagnosis not present

## 2019-08-30 DIAGNOSIS — S71111A Laceration without foreign body, right thigh, initial encounter: Secondary | ICD-10-CM | POA: Diagnosis not present

## 2019-08-30 LAB — SARS CORONAVIRUS 2 (TAT 6-24 HRS): SARS Coronavirus 2: NEGATIVE

## 2019-08-30 NOTE — Progress Notes (Signed)
Anesthesia Chart Review  Case previously cancelled due to rash, noted in progress note 08/06/19.  Pt reports rash has resolved.  He does report falling in the shower about 7 days ago, laceration to right lateral thigh.  Approximately 4 cm wound with some surrounding redness and healing bruise to right lateral thigh, wound appears deep, large scab vs. Necrotic tissue.  Advised pt to contact PCP and Dr. Aurea Graff office.  I left a message with Dr. Aurea Graff office as well.    Maia Plan [facility] Pre-Surgical Testing [phone] 08/30/19  12:35 PM

## 2019-09-03 LAB — TYPE AND SCREEN
ABO/RH(D): O POS
Antibody Screen: NEGATIVE

## 2019-09-06 ENCOUNTER — Other Ambulatory Visit: Payer: Self-pay | Admitting: *Deleted

## 2019-09-06 NOTE — Patient Outreach (Signed)
[facility]) Care Management  09/06/2019  Bodi Palmeri [birth year] [phone]   Telephone Assessment  RN spoke with pt today who provided an update on his ongoing management of care. Pt reports a weight lost from 250 lbs-230 lbs with a goal to get to 200 lbs. Pt has been eating less and continue to perform the assigned exercises from [facility] since being discharged from the [facility] agency. Pt spoke very highly of the agency and all that was done for him while in therapy. Pt was able to verify he remains in the GREEN zone with no acute symptoms and denies any swelling or related symptoms at this time. Pt reports he is able to recognize any symptoms and has a regimen in place to take the medication Lasix with any related swelling in the the AM. Pt is aware and comfort with the "as needed" prescription. Plan of care reviewed with the current goal in place. Will update the interventions and discuss transfer to a Health Coach for ongoing disease management services with [facility].   Plan: Will update the pt's provider of the transition and disposition with [facility] services.  [facility] CM Care Plan Problem One     Most Recent Value  Care Plan Problem One  Knowledge Deficit related to [place] plan  Role Documenting the Problem One  Care Management Telephonic Coordinator  Care Plan for Problem One  Active  [facility] Long Term Goal   Pt will verbalize the plan of action in the GREEN zone within the next 90 days.  [facility] Long Term Goal Start Date  04/08/19  Interventions for Problem One Long Term Goal  Will doing very will in managing his HF with weight loss and low sodium diet. Will continue to review the HF symptoms and pt's aware on what to do if acue symptoms should occur.       [name], RN Care Management Coordinator [place] Office [phone]

## 2019-10-02 ENCOUNTER — Encounter (HOSPITAL_COMMUNITY): Payer: Self-pay

## 2019-10-02 DIAGNOSIS — M25551 Pain in right hip: Secondary | ICD-10-CM | POA: Diagnosis not present

## 2019-10-02 DIAGNOSIS — M1611 Unilateral primary osteoarthritis, right hip: Secondary | ICD-10-CM | POA: Diagnosis not present

## 2019-10-02 DIAGNOSIS — S71101D Unspecified open wound, right thigh, subsequent encounter: Secondary | ICD-10-CM | POA: Diagnosis not present

## 2019-10-02 NOTE — Patient Instructions (Addendum)
DUE TO COVID-19 ONLY ONE VISITOR ARE ALLOWED TO COME WITH YOU AND STAY IN THE WAITING ROOM ONLY DURING PRE OP AND PROCEDURE. THEN TWO VISITORS MAY VISIT WITH YOU IN YOUR PRIVATE ROOM DURING VISITING HOURS ONLY!!   COVID SWAB TESTING MUST BE COMPLETED ON: Friday, October 11, 2019 at  9:45 AM   [address], [place] [facility] enter pre surgical testing line (Must self quarantine after testing. Follow instructions on handout.)             Your procedure is scheduled on: Tuesday, October 15, 2019   Report to [facility] Main  Entrance    Report to admitting at 6:10 AM   Call this number if you have problems the morning of surgery [phone]   Do not eat food:After Midnight.   May have liquids until 5:40 AM day of surgery   CLEAR LIQUID DIET  Foods Allowed                                                                     Foods Excluded  Water, Black Coffee and tea, regular and decaf                             liquids that you cannot  Plain Jell-O in any flavor  (No red)                                           see through such as: Fruit ices (not with fruit pulp)                                     milk, soups, orange juice  Iced Popsicles (No red)                                    All solid food Carbonated beverages, regular and diet                                    Apple juices Sports drinks like Gatorade (No red) Lightly seasoned clear broth or consume(fat free) Sugar, honey syrup  Sample Menu Breakfast                                Lunch                                     Supper Cranberry juice                    Beef broth                            Chicken broth Jell-O  Grape juice                           Apple juice Coffee or tea                        Jell-O                                      Popsicle                                                Coffee or tea                        Coffee or  tea   Complete one Ensure drink the morning of surgery at 5:40 AM the day of surgery.   Oral Hygiene is also important to reduce your risk of infection.                                    Remember - BRUSH YOUR TEETH THE MORNING OF SURGERY WITH YOUR REGULAR TOOTHPASTE   Do NOT smoke after Midnight   Take these medicines the morning of surgery with A SIP OF WATER: Atorvastatin, Escitalopram, Loratadine, Metoprolol, Pantoprazole                               You may not have any metal on your body including jewelry, and body piercings             Do not wear lotions, powders, perfumes/cologne, or deodorant                          Men may shave face and neck.   Do not bring valuables to the hospital. [place].   Contacts, dentures or bridgework may not be worn into surgery.   Bring small overnight bag day of surgery.    Patients discharged the day of surgery will not be allowed to drive home.   Special Instructions: Bring a copy of your healthcare power of attorney and living will documents         the day of surgery if you haven't scanned them in before.              Please read over the following fact sheets you were given: IF YOU HAVE QUESTIONS ABOUT YOUR PRE OP INSTRUCTIONS PLEASE CALL [phone]   [facility] - Preparing for Surgery Before surgery, you can play an important role.  Because skin is not sterile, your skin needs to be as free of germs as possible.  You can reduce the number of germs on your skin by washing with CHG (chlorahexidine gluconate) soap before surgery.  CHG is an antiseptic cleaner which kills germs and bonds with the skin to continue killing germs even after washing. Please DO NOT use if you have an allergy to CHG or antibacterial soaps.  If your  skin becomes reddened/irritated stop using the CHG and inform your nurse when you arrive at Short Stay. Do not shave (including legs and underarms) for at least 48  hours prior to the first CHG shower.  You may shave your face/neck.  Please follow these instructions carefully:  1.  Shower with CHG Soap the night before surgery and the  morning of surgery.  2.  If you choose to wash your hair, wash your hair first as usual with your normal  shampoo.  3.  After you shampoo, rinse your hair and body thoroughly to remove the shampoo.                             4.  Use CHG as you would any other liquid soap.  You can apply chg directly to the skin and wash.  Gently with a scrungie or clean washcloth.  5.  Apply the CHG Soap to your body ONLY FROM THE NECK DOWN.   Do   not use on face/ open                           Wound or open sores. Avoid contact with eyes, ears mouth and   genitals (private parts).                       Wash face,  Genitals (private parts) with your normal soap.             6.  Wash thoroughly, paying special attention to the area where your    surgery  will be performed.  7.  Thoroughly rinse your body with warm water from the neck down.  8.  DO NOT shower/wash with your normal soap after using and rinsing off the CHG Soap.                9.  Pat yourself dry with a clean towel.            10.  Wear clean pajamas.            11.  Place clean sheets on your bed the night of your first shower and do not  sleep with pets. Day of Surgery : Do not apply any lotions/deodorants the morning of surgery.  Please wear clean clothes to the hospital/surgery center.  FAILURE TO FOLLOW THESE INSTRUCTIONS MAY RESULT IN THE CANCELLATION OF YOUR SURGERY  PATIENT SIGNATURE_________________________________  NURSE SIGNATURE__________________________________  ________________________________________________________________________   Darryl Diaz  An incentive spirometer is a tool that can help keep your lungs clear and active. This tool measures how well you are filling your lungs with each breath. Taking long deep breaths may help reverse or  decrease the chance of developing breathing (pulmonary) problems (especially infection) following:  A long period of time when you are unable to move or be active. BEFORE THE PROCEDURE   If the spirometer includes an indicator to show your best effort, your nurse or respiratory therapist will set it to a desired goal.  If possible, sit up straight or lean slightly forward. Try not to slouch.  Hold the incentive spirometer in an upright position. INSTRUCTIONS FOR USE  1. Sit on the edge of your bed if possible, or sit up as far as you can in bed or on a chair. 2. Hold the incentive spirometer in an upright position. 3. Breathe out normally. 4.  Place the mouthpiece in your mouth and seal your lips tightly around it. 5. Breathe in slowly and as deeply as possible, raising the piston or the ball toward the top of the column. 6. Hold your breath for 3-5 seconds or for as long as possible. Allow the piston or ball to fall to the bottom of the column. 7. Remove the mouthpiece from your mouth and breathe out normally. 8. Rest for a few seconds and repeat Steps 1 through 7 at least 10 times every 1-2 hours when you are awake. Take your time and take a few normal breaths between deep breaths. 9. The spirometer may include an indicator to show your best effort. Use the indicator as a goal to work toward during each repetition. 10. After each set of 10 deep breaths, practice coughing to be sure your lungs are clear. If you have an incision (the cut made at the time of surgery), support your incision when coughing by placing a pillow or rolled up towels firmly against it. Once you are able to get out of bed, walk around indoors and cough well. You may stop using the incentive spirometer when instructed by your caregiver.  RISKS AND COMPLICATIONS  Take your time so you do not get dizzy or light-headed.  If you are in pain, you may need to take or ask for pain medication before doing incentive spirometry.  It is harder to take a deep breath if you are having pain. AFTER USE  Rest and breathe slowly and easily.  It can be helpful to keep track of a log of your progress. Your caregiver can provide you with a simple table to help with this. If you are using the spirometer at home, follow these instructions: [place] IF:   You are having difficultly using the spirometer.  You have trouble using the spirometer as often as instructed.  Your pain medication is not giving enough relief while using the spirometer.  You develop fever of 100.5 F (38.1 C) or higher. SEEK IMMEDIATE MEDICAL CARE IF:   You cough up bloody sputum that had not been present before.  You develop fever of 102 F (38.9 C) or greater.  You develop worsening pain at or near the incision site. MAKE SURE YOU:   Understand these instructions.  Will watch your condition.  Will get help right away if you are not doing well or get worse. Document Released: 09/05/2006 Document Revised: 07/18/2011 Document Reviewed: 11/06/2006 ExitCare Patient Information 2014 ExitCare, [place].   ________________________________________________________________________  WHAT IS A BLOOD TRANSFUSION? Blood Transfusion Information  A transfusion is the replacement of blood or some of its parts. Blood is made up of multiple cells which provide different functions.  Red blood cells carry oxygen and are used for blood loss replacement.  White blood cells fight against infection.  Platelets control bleeding.  Plasma helps clot blood.  Other blood products are available for specialized needs, such as hemophilia or other clotting disorders. BEFORE THE TRANSFUSION  Who gives blood for transfusions?   Healthy volunteers who are fully evaluated to make sure their blood is safe. This is blood bank blood. Transfusion therapy is the safest it has ever been in the practice of medicine. Before blood is taken from a donor, a complete  history is taken to make sure that person has no history of diseases nor engages in risky social behavior (examples are intravenous drug use or sexual activity with multiple partners). The donor's travel history is  screened to minimize risk of transmitting infections, such as malaria. The donated blood is tested for signs of infectious diseases, such as HIV and hepatitis. The blood is then tested to be sure it is compatible with you in order to minimize the chance of a transfusion reaction. If you or a relative donates blood, this is often done in anticipation of surgery and is not appropriate for emergency situations. It takes many days to process the donated blood. RISKS AND COMPLICATIONS Although transfusion therapy is very safe and saves many lives, the main dangers of transfusion include:   Getting an infectious disease.  Developing a transfusion reaction. This is an allergic reaction to something in the blood you were given. Every precaution is taken to prevent this. The decision to have a blood transfusion has been considered carefully by your caregiver before blood is given. Blood is not given unless the benefits outweigh the risks. AFTER THE TRANSFUSION  Right after receiving a blood transfusion, you will usually feel much better and more energetic. This is especially true if your red blood cells have gotten low (anemic). The transfusion raises the level of the red blood cells which carry oxygen, and this usually causes an energy increase.  The nurse administering the transfusion will monitor you carefully for complications. HOME CARE INSTRUCTIONS  No special instructions are needed after a transfusion. You may find your energy is better. Speak with your caregiver about any limitations on activity for underlying diseases you may have. SEEK MEDICAL CARE IF:   Your condition is not improving after your transfusion.  You develop redness or irritation at the intravenous (IV) site. SEEK  IMMEDIATE MEDICAL CARE IF:  Any of the following symptoms occur over the next 12 hours:  Shaking chills.  You have a temperature by mouth above 102 F (38.9 C), not controlled by medicine.  Chest, back, or muscle pain.  People around you feel you are not acting correctly or are confused.  Shortness of breath or difficulty breathing.  Dizziness and fainting.  You get a rash or develop hives.  You have a decrease in urine output.  Your urine turns a dark color or changes to pink, red, or brown. Any of the following symptoms occur over the next 10 days:  You have a temperature by mouth above 102 F (38.9 C), not controlled by medicine.  Shortness of breath.  Weakness after normal activity.  The white part of the eye turns yellow (jaundice).  You have a decrease in the amount of urine or are urinating less often.  Your urine turns a dark color or changes to pink, red, or brown. Document Released: 04/22/2000 Document Revised: 07/18/2011 Document Reviewed: 12/10/2007 [facility] Patient Information 2014 [place], [place].  _______________________________________________________________________

## 2019-10-03 ENCOUNTER — Encounter (HOSPITAL_COMMUNITY): Payer: Self-pay

## 2019-10-03 ENCOUNTER — Other Ambulatory Visit: Payer: Self-pay

## 2019-10-03 ENCOUNTER — Encounter (HOSPITAL_COMMUNITY)
Admission: RE | Admit: 2019-10-03 | Discharge: 2019-10-03 | Disposition: A | Discharge status: Home / Self Care | Payer: Medicare Other | Source: Ambulatory Visit | Attending: Orthopedic Surgery | Admitting: Orthopedic Surgery

## 2019-10-03 DIAGNOSIS — Z8249 Family history of ischemic heart disease and other diseases of the circulatory system: Secondary | ICD-10-CM | POA: Insufficient documentation

## 2019-10-03 DIAGNOSIS — G4733 Obstructive sleep apnea (adult) (pediatric): Secondary | ICD-10-CM | POA: Diagnosis not present

## 2019-10-03 DIAGNOSIS — Z7901 Long term (current) use of anticoagulants: Secondary | ICD-10-CM | POA: Insufficient documentation

## 2019-10-03 DIAGNOSIS — E785 Hyperlipidemia, unspecified: Secondary | ICD-10-CM | POA: Insufficient documentation

## 2019-10-03 DIAGNOSIS — Z79899 Other long term (current) drug therapy: Secondary | ICD-10-CM | POA: Diagnosis not present

## 2019-10-03 DIAGNOSIS — Z01812 Encounter for preprocedural laboratory examination: Secondary | ICD-10-CM | POA: Insufficient documentation

## 2019-10-03 DIAGNOSIS — Z880 Allergy status to penicillin: Secondary | ICD-10-CM | POA: Diagnosis not present

## 2019-10-03 DIAGNOSIS — Z87891 Personal history of nicotine dependence: Secondary | ICD-10-CM | POA: Insufficient documentation

## 2019-10-03 DIAGNOSIS — I480 Paroxysmal atrial fibrillation: Secondary | ICD-10-CM | POA: Insufficient documentation

## 2019-10-03 DIAGNOSIS — M1611 Unilateral primary osteoarthritis, right hip: Secondary | ICD-10-CM | POA: Diagnosis not present

## 2019-10-03 DIAGNOSIS — I10 Essential (primary) hypertension: Secondary | ICD-10-CM | POA: Diagnosis not present

## 2019-10-03 HISTORY — DX: Personal history of other diseases of the digestive system: Z87.19

## 2019-10-03 HISTORY — DX: Fatty (change of) liver, not elsewhere classified: K76.0

## 2019-10-03 HISTORY — DX: Obstructive sleep apnea (adult) (pediatric): G47.33

## 2019-10-03 LAB — CBC
HCT: 29.5 % — ABNORMAL LOW (ref 39.0–52.0)
Hemoglobin: 9.5 g/dL — ABNORMAL LOW (ref 13.0–17.0)
MCH: 32.2 pg (ref 26.0–34.0)
MCHC: 32.2 g/dL (ref 30.0–36.0)
MCV: 100 fL (ref 80.0–100.0)
Platelets: 189 10*3/uL (ref 150–400)
RBC: 2.95 MIL/uL — ABNORMAL LOW (ref 4.22–5.81)
RDW: 15.1 % (ref 11.5–15.5)
WBC: 6.3 10*3/uL (ref 4.0–10.5)
nRBC: 0 % (ref 0.0–0.2)

## 2019-10-03 LAB — TYPE AND SCREEN
ABO/RH(D): O POS
Antibody Screen: NEGATIVE

## 2019-10-03 LAB — SURGICAL PCR SCREEN
MRSA, PCR: NEGATIVE
Staphylococcus aureus: NEGATIVE

## 2019-10-03 LAB — BASIC METABOLIC PANEL
Anion gap: 8 (ref 5–15)
BUN: 17 mg/dL (ref 8–23)
CO2: 24 mmol/L (ref 22–32)
Calcium: 9.1 mg/dL (ref 8.9–10.3)
Chloride: 104 mmol/L (ref 98–111)
Creatinine, Ser: 1.07 mg/dL (ref 0.61–1.24)
GFR calc Af Amer: >60 mL/min (ref >60)
GFR calc non Af Amer: >60 mL/min (ref >60)
Glucose, Bld: 103 mg/dL — ABNORMAL HIGH (ref 70–99)
Potassium: 4.4 mmol/L (ref 3.5–5.1)
Sodium: 136 mmol/L (ref 135–145)

## 2019-10-03 NOTE — Progress Notes (Signed)
COVID Vaccine Completed: Yes Date COVID Vaccine completed: about 6 weeks ago per patient COVID vaccine manufacturer: Pfizer      PCP - Dr. Alfonso Patten. [place] Cardiologist - Dr. Olin Pia Last office visit and clearance note 08/12/19 in epic  Chest x-ray - 04/22/19 in epic EKG - 08/12/19 in epic Stress Test - N/A ECHO - 04/21/19 in epic Cardiac Cath - N/A  Sleep Study - Yes CPAP - N/A  Fasting Blood Sugar - N/A Checks Blood Sugar _N/A____ times a day  Blood Thinner Instructions:  Eliquis last dose will be October 11, 2019 Aspirin Instructions: N/A Last Dose: N/A  Anesthesia review: MI, Afib, HTN, Cardiac arrest 04/2019  Patient denies shortness of breath, fever, cough and chest pain at PAT appointment   Patient verbalized understanding of instructions that were given to them at the PAT appointment. Patient was also instructed that they will need to review over the PAT instructions again at home before surgery.

## 2019-10-08 NOTE — H&P (Signed)
TOTAL HIP ADMISSION H&P  Patient is admitted for right total hip arthroplasty.  Subjective:  Chief Complaint: right hip pain  HPI: Darryl Diaz, 79 y.o. male, has a history of pain and functional disability in the right hip(s) due to arthritis and patient has failed non-surgical conservative treatments for greater than 12 weeks to include NSAID's and/or analgesics, use of assistive devices and activity modification.  Onset of symptoms was gradual starting 3.5 years ago with gradually worsening course since that time.The patient noted no past surgery on the right hip(s).  Patient currently rates pain in the right hip at 9 out of 10 with activity. Patient has worsening of pain with activity and weight bearing, trendelenberg gait, pain that interfers with activities of daily living and pain with passive range of motion. Patient has evidence of periarticular osteophytes and joint space narrowing by imaging studies. This condition presents safety issues increasing the risk of falls.  There is no current active infection.  Risks, benefits and expectations were discussed with the patient.  Risks including but not limited to the risk of anesthesia, blood clots, nerve damage, blood vessel damage, failure of the prosthesis, infection and up to and including death.  Patient understand the risks, benefits and expectations and wishes to proceed with surgery.   PCP: [name], MD  D/C Plans:       Home   Post-op Meds:       No Rx given   Tranexamic Acid:      To be given - IV   Decadron:      Is to be given  FYI:     Eliquis (on pre-op)  Norco  DME:   Pt already has equipment  PT:   [facility]  Pharmacy: [facility]    Patient Active Problem List   Diagnosis Date Noted  . Bradycardia 04/20/2019  . Atrial fibrillation with rapid ventricular response ([place])   . GI bleed 02/25/2019  . Falls 02/25/2019  . Bilateral leg edema 02/25/2019  . Hyponatremia 02/25/2019  . Acute posthemorrhagic  anemia 08/11/2018  . Symptomatic anemia 08/11/2018  . Hx of adenomatous colonic polyps 08/10/2017  . PAF (paroxysmal atrial fibrillation) ([place]) 02/05/2017  . Dislocation of hip, posterior, left, closed ([place]) 04/30/2016  . Hip fracture ([place]) 03/30/2016  . Hypertension 03/30/2016  . Hyperlipidemia 03/30/2016  . GERD (gastroesophageal reflux disease) 03/30/2016  . Osteoarthritis 03/30/2016  . Depression 03/30/2016  . Rhabdomyolysis 03/30/2016  . Leukocytosis 03/30/2016  . Pressure injury of skin 03/30/2016  . Closed fracture of left hip [facility])    Past Medical History:  Diagnosis Date  . 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 Anemia   . Atrial fibrillation ([place])   . Bradycardia    low 20's  . Cardiac arrest ([place]) 04/20/2019   12 min CPR with epi  . Dysrhythmia   . Fall at home 03/28/2016  . Fatty liver   . GERD (gastroesophageal reflux disease)   . H/O seasonal allergies   . History of GI bleed   . Hx of adenomatous colonic polyps 08/10/2017  . Hyperlipidemia   . Hypertension   . Insomnia   . 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 MI (myocardial infarction) ([place]) 04/2019   had CPR by EMS  . OA (osteoarthritis)    hands  . OSA (obstructive sleep apnea)    No longer uses CPAP    Past Surgical History:  Procedure Laterality  Date  . ANKLE SURGERY Left 2014   had rods put in   . BIOPSY  02/28/2019   Procedure: BIOPSY;  Surgeon: Milus Banister, MD;  Location: [facility] ENDOSCOPY;  Service: Endoscopy;;  . [place]  . ESOPHAGOGASTRODUODENOSCOPY (EGD) WITH PROPOFOL N/A 02/28/2019   Procedure: ESOPHAGOGASTRODUODENOSCOPY (EGD) WITH PROPOFOL;  Surgeon: Milus Banister, MD;  Location[facility] ENDOSCOPY;  Service: Endoscopy;  Laterality: N/A;  . HIP ARTHROPLASTY Left 04/01/2016   Procedure: ARTHROPLASTY BIPOLAR HIP (HEMIARTHROPLASTY);  Surgeon: Paralee Cancel, MD;  Location: [facility];  Service: Orthopedics;  Laterality: Left;  . HIP CLOSED  REDUCTION Left 04/30/2016   Procedure: CLOSED REDUCTION HIP;  Surgeon: Wylene Simmer, MD;  Location: [facility];  Service: Orthopedics;  Laterality: Left;  . TONSILLECTOMY      No current facility-administered medications for this encounter.   Current Outpatient Medications  Medication Sig Dispense Refill Last Dose  . acetaminophen (TYLENOL) 500 MG tablet Take 1,000 mg by mouth every 6 (six) hours as needed for headache (pain).     Marland Kitchen apixaban (ELIQUIS) 5 MG TABS tablet Take 1 tablet (5 mg total) by mouth 2 (two) times daily.     Marland Kitchen atorvastatin (LIPITOR) 20 MG tablet Take 20 mg by mouth daily.     Marland Kitchen escitalopram (LEXAPRO) 10 MG tablet Take 10 mg by mouth daily.     . ferrous sulfate 325 (65 FE) MG EC tablet Take 650 mg by mouth daily.      . folic acid (FOLVITE) 1 MG tablet Take 1 mg by mouth daily.     . furosemide (LASIX) 40 MG tablet Take 1 tablet (40 mg total) by mouth daily as needed for fluid (weight gain). 30 tablet 3   . loratadine (CLARITIN) 10 MG tablet Take 10 mg by mouth daily.     . Multiple Vitamin (MULTIVITAMIN WITH MINERALS) TABS tablet Take 1 tablet by mouth daily.     . pantoprazole (PROTONIX) 40 MG tablet Take 40 mg by mouth daily.     . potassium chloride SA (KLOR-CON) 20 MEQ tablet Take 2 tablets (40 mEq total) by mouth daily. 90 tablet 3   . tamsulosin (FLOMAX) 0.4 MG CAPS capsule Take 0.4 mg by mouth at bedtime.      . Thiamine HCl (VITAMIN B-1) 250 MG tablet Take 250 mg by mouth daily.     . traZODone (DESYREL) 50 MG tablet Take 50 mg by mouth at bedtime.     . metoprolol succinate (TOPROL-XL) 25 MG 24 hr tablet Take 3 tablets (75 mg total) by mouth daily. 270 tablet 3    Facility-Administered Medications Ordered in Other Encounters  Medication Dose Route Frequency Provider Last Rate Last Admin  . metoprolol tartrate (LOPRESSOR) injection    Anesthesia Intra-op Mariea Clonts, CRNA   5 mg at 02/27/19 1318   Allergies  Allergen Reactions  . Penicillins Other (See  Comments)    Did it involve swelling of the face/tongue/throat, SOB, or low BP? Unknown Did it involve sudden or severe rash/hives, skin peeling, or any reaction on the inside of your mouth or nose? Unknown Did you need to seek medical attention at a hospital or doctor's office? Unknown When did it last happen?childhood reaction If all above answers are "NO", may proceed with cephalosporin use.    Social History   Tobacco Use  . Smoking status: Former Smoker    Types: Cigarettes  . Smokeless tobacco: Never Used  . Tobacco comment:  quit 20 years  smoked on and off for 20 years  Substance Use Topics  . Alcohol use: No    Comment: 03/2016   i HAD ONE DRINK & THAT WAS THE FIRST DRINK i HAD IN A LONG TIME     Family History  Problem Relation Age of Onset  . Heart disease Mother 99  . Hypertension Mother   . Arthritis Mother   . Heart failure Father 77  . Stroke Maternal Aunt   . Heart failure Sister   . Colon cancer Neg Hx   . Esophageal cancer Neg Hx   . Stomach cancer Neg Hx   . Rectal cancer Neg Hx       Review of Systems  Constitutional: Negative.   HENT: Negative.   Eyes: Negative.   Respiratory: Negative.   Cardiovascular: Negative.   Gastrointestinal: Negative.   Genitourinary: Positive for frequency and urgency.  Musculoskeletal: Positive for joint pain.  Skin: Negative.   Neurological: Negative.   Endo/Heme/Allergies: Positive for environmental allergies.  Psychiatric/Behavioral: Positive for depression.      Objective:  Physical Exam  Constitutional: He is oriented to person, place, and time. He appears well-developed.  HENT:  Head: Normocephalic.  Eyes: Pupils are equal, round, and reactive to light.  Neck: No JVD present. No tracheal deviation present. No thyromegaly present.  Cardiovascular: Normal rate, regular rhythm and intact distal pulses.  Respiratory: Effort normal and breath sounds normal. No respiratory distress. He has no wheezes.   GI: Soft. There is no abdominal tenderness. There is no guarding.  Musculoskeletal:     Cervical back: Neck supple.     Right hip: Tenderness and bony tenderness present. No swelling, deformity or lacerations. Decreased range of motion. Decreased strength.  Lymphadenopathy:    He has no cervical adenopathy.  Neurological: He is alert and oriented to person, place, and time.  Skin: Skin is warm and dry.  Psychiatric: He has a normal mood and affect.      Labs:  Estimated body mass index is 31.38 kg/m as calculated from the following:   Height as of 10/03/19: 6' (1.829 m).   Weight as of 10/03/19: 105 kg.   Imaging Review Plain radiographs demonstrate severe degenerative joint disease of the right hip. The bone quality appears to be good for age and reported activity level.      Assessment/Plan:  End stage arthritis, right hip  The patient history, physical examination, clinical judgement of the provider and imaging studies are consistent with end stage degenerative joint disease of the right hip and total hip arthroplasty is deemed medically necessary. The treatment options including medical management, injection therapy, arthroscopy and arthroplasty were discussed at length. The risks and benefits of total hip arthroplasty were presented and reviewed. The risks due to aseptic loosening, infection, stiffness, dislocation/subluxation,  thromboembolic complications and other imponderables were discussed.  The patient acknowledged the explanation, agreed to proceed with the plan and consent was signed. Patient is being admitted for treatment for surgery, pain control, PT, OT, prophylactic antibiotics, VTE prophylaxis, progressive ambulation and ADL's and discharge planning.The patient is planning to be discharged home.    [place] P[place] Deryck Hippler   PA-C  10/08/2019, 8:36 AM

## 2019-10-11 ENCOUNTER — Other Ambulatory Visit (HOSPITAL_COMMUNITY)
Admission: RE | Admit: 2019-10-11 | Discharge: 2019-10-11 | Disposition: A | Discharge status: Home / Self Care | Payer: Medicare Other | Source: Ambulatory Visit | Attending: Orthopedic Surgery | Admitting: Orthopedic Surgery

## 2019-10-11 DIAGNOSIS — Z01812 Encounter for preprocedural laboratory examination: Secondary | ICD-10-CM | POA: Diagnosis not present

## 2019-10-11 DIAGNOSIS — Z20822 Contact with and (suspected) exposure to covid-19: Secondary | ICD-10-CM | POA: Insufficient documentation

## 2019-10-12 LAB — SARS CORONAVIRUS 2 (TAT 6-24 HRS): SARS Coronavirus 2: NEGATIVE

## 2019-10-14 NOTE — Anesthesia Preprocedure Evaluation (Addendum)
Anesthesia Evaluation  Patient identified by MRN, date of birth, ID band Patient awake    Reviewed: Allergy & Precautions, NPO status , Patient's Chart, lab work & pertinent test results, reviewed documented beta blocker date and time   History of Anesthesia Complications Negative for: history of anesthetic complications  Airway Mallampati: IV  TM Distance: >3 FB Neck ROM: Full    Dental  (+) Dental Advisory Given, Teeth Intact   Pulmonary sleep apnea , former smoker,    Pulmonary exam normal        Cardiovascular hypertension, Pt. on home beta blockers and Pt. on medications + Past MI  Normal cardiovascular exam+ dysrhythmias Atrial Fibrillation    '20 TTE - EF 60-65%. Left atrial size was severely dilated. Trivial TR. Limited echo s/p cardiac arrest.   Cleared by cardiology 08/05/19.  Per OV note, "Chart reviewed as part of pre-operative protocol coverage. Patient was contacted 08/05/2019 in reference to pre-operative risk assessment for pending surgery as outlined below.  Castiel Lauricella was last seen on 06/13/19 by Kathyrn Drown, NP for PAF, HTN and chronic diastolic Heart Failure. He was stable at that time and today he tells me he has no chest pain or SOB but severe knee pain and he needs surgery.  No cardiac issues since last visit. Since that day, Rob Mciver has done well."  Previously scheduled case in April 2021 cancelled due to rash   Neuro/Psych PSYCHIATRIC DISORDERS Depression negative neurological ROS     GI/Hepatic Neg liver ROS, GERD  Medicated and Controlled,  Endo/Other   Obesity   Renal/GU negative Renal ROS     Musculoskeletal  (+) Arthritis , Osteoarthritis,    Abdominal   Peds  Hematology  On eliquis, last dose 6/4     Anesthesia Other Findings Covid neg 6/4  Reproductive/Obstetrics                            Anesthesia Physical Anesthesia Plan  ASA:  III  Anesthesia Plan: Spinal   Post-op Pain Management:    Induction:   PONV Risk Score and Plan: 1 and Treatment may vary due to age or medical condition and Propofol infusion  Airway Management Planned: Natural Airway and Simple Face Mask  Additional Equipment: None  Intra-op Plan:   Post-operative Plan:   Informed Consent: I have reviewed the patients History and Physical, chart, labs and discussed the procedure including the risks, benefits and alternatives for the proposed anesthesia with the patient or authorized representative who has indicated his/her understanding and acceptance.       Plan Discussed with: CRNA and Anesthesiologist  Anesthesia Plan Comments: (Labs reviewed, platelets acceptable. Discussed risks and benefits of spinal, including spinal/epidural hematoma, infection, failed block, and PDPH. Patient expressed understanding and wished to proceed. )       Anesthesia Quick Evaluation

## 2019-10-14 NOTE — Progress Notes (Signed)
patien called [facility] for clarification of surgery time and arrival time.  Patient made aware to arrive at 0610am on 6/8 with surgery start time of 0840am.  Clear liquids until 0540am with preop drink completed at 0540am.

## 2019-10-15 ENCOUNTER — Encounter (HOSPITAL_COMMUNITY): Payer: Self-pay | Admitting: Orthopedic Surgery

## 2019-10-15 ENCOUNTER — Observation Stay (HOSPITAL_COMMUNITY)
Admission: RE | Admit: 2019-10-15 | Discharge: 2019-10-16 | Disposition: A | Discharge status: Home / Self Care | Payer: Medicare Other | Attending: Orthopedic Surgery | Admitting: Orthopedic Surgery

## 2019-10-15 ENCOUNTER — Ambulatory Visit (HOSPITAL_COMMUNITY): Payer: Medicare Other | Admitting: Physician Assistant

## 2019-10-15 ENCOUNTER — Ambulatory Visit (HOSPITAL_COMMUNITY): Payer: Medicare Other

## 2019-10-15 ENCOUNTER — Observation Stay (HOSPITAL_COMMUNITY): Payer: Medicare Other

## 2019-10-15 ENCOUNTER — Ambulatory Visit (HOSPITAL_COMMUNITY): Payer: Medicare Other | Admitting: Anesthesiology

## 2019-10-15 ENCOUNTER — Encounter (HOSPITAL_COMMUNITY)
Admission: RE | Disposition: A | Discharge status: Home / Self Care | Payer: Self-pay | Source: Home / Self Care | Attending: Orthopedic Surgery

## 2019-10-15 ENCOUNTER — Other Ambulatory Visit: Payer: Self-pay

## 2019-10-15 DIAGNOSIS — Z96649 Presence of unspecified artificial hip joint: Secondary | ICD-10-CM

## 2019-10-15 DIAGNOSIS — M1611 Unilateral primary osteoarthritis, right hip: Principal | ICD-10-CM | POA: Diagnosis present

## 2019-10-15 DIAGNOSIS — Z79899 Other long term (current) drug therapy: Secondary | ICD-10-CM | POA: Insufficient documentation

## 2019-10-15 DIAGNOSIS — Z96642 Presence of left artificial hip joint: Secondary | ICD-10-CM | POA: Insufficient documentation

## 2019-10-15 DIAGNOSIS — Z419 Encounter for procedure for purposes other than remedying health state, unspecified: Secondary | ICD-10-CM

## 2019-10-15 DIAGNOSIS — Z880 Allergy status to penicillin: Secondary | ICD-10-CM | POA: Diagnosis not present

## 2019-10-15 DIAGNOSIS — M6281 Muscle weakness (generalized): Secondary | ICD-10-CM | POA: Insufficient documentation

## 2019-10-15 DIAGNOSIS — Z7901 Long term (current) use of anticoagulants: Secondary | ICD-10-CM | POA: Diagnosis not present

## 2019-10-15 DIAGNOSIS — K219 Gastro-esophageal reflux disease without esophagitis: Secondary | ICD-10-CM | POA: Diagnosis not present

## 2019-10-15 DIAGNOSIS — D649 Anemia, unspecified: Secondary | ICD-10-CM | POA: Insufficient documentation

## 2019-10-15 DIAGNOSIS — I252 Old myocardial infarction: Secondary | ICD-10-CM | POA: Diagnosis not present

## 2019-10-15 DIAGNOSIS — Z96641 Presence of right artificial hip joint: Secondary | ICD-10-CM

## 2019-10-15 DIAGNOSIS — F325 Major depressive disorder, single episode, in full remission: Secondary | ICD-10-CM | POA: Diagnosis not present

## 2019-10-15 DIAGNOSIS — I480 Paroxysmal atrial fibrillation: Secondary | ICD-10-CM | POA: Insufficient documentation

## 2019-10-15 DIAGNOSIS — I10 Essential (primary) hypertension: Secondary | ICD-10-CM | POA: Insufficient documentation

## 2019-10-15 DIAGNOSIS — Z8674 Personal history of sudden cardiac arrest: Secondary | ICD-10-CM | POA: Diagnosis not present

## 2019-10-15 DIAGNOSIS — G4733 Obstructive sleep apnea (adult) (pediatric): Secondary | ICD-10-CM | POA: Diagnosis not present

## 2019-10-15 DIAGNOSIS — E785 Hyperlipidemia, unspecified: Secondary | ICD-10-CM | POA: Diagnosis not present

## 2019-10-15 DIAGNOSIS — K760 Fatty (change of) liver, not elsewhere classified: Secondary | ICD-10-CM | POA: Diagnosis not present

## 2019-10-15 DIAGNOSIS — Z87891 Personal history of nicotine dependence: Secondary | ICD-10-CM | POA: Diagnosis not present

## 2019-10-15 DIAGNOSIS — Z9889 Other specified postprocedural states: Secondary | ICD-10-CM

## 2019-10-15 DIAGNOSIS — G4700 Insomnia, unspecified: Secondary | ICD-10-CM | POA: Insufficient documentation

## 2019-10-15 DIAGNOSIS — Z471 Aftercare following joint replacement surgery: Secondary | ICD-10-CM | POA: Diagnosis not present

## 2019-10-15 HISTORY — PX: TOTAL HIP ARTHROPLASTY: SHX124

## 2019-10-15 SURGERY — ARTHROPLASTY, HIP, TOTAL, ANTERIOR APPROACH
Anesthesia: Spinal | Site: Hip | Laterality: Right

## 2019-10-15 MED ORDER — STERILE WATER FOR IRRIGATION IR SOLN
Status: DC | PRN
  Administered 2019-10-15: 2000 mL

## 2019-10-15 MED ORDER — OXYCODONE HCL 5 MG PO TABS: 5.0000 mg | ORAL_TABLET | Freq: Once | ORAL | Status: DC | PRN

## 2019-10-15 MED ORDER — MENTHOL 3 MG MT LOZG
1.0000 | LOZENGE | OROMUCOSAL | Status: DC | PRN
  Filled 2019-10-15: qty 9

## 2019-10-15 MED ORDER — TRANEXAMIC ACID-NACL 1000-0.7 MG/100ML-% IV SOLN
1000.0000 mg | INTRAVENOUS | Status: AC
  Administered 2019-10-15: 1000 mg via INTRAVENOUS
  Filled 2019-10-15: qty 100

## 2019-10-15 MED ORDER — HYDROMORPHONE HCL 1 MG/ML IJ SOLN: 0.5000 mg | INTRAMUSCULAR | Status: DC | PRN

## 2019-10-15 MED ORDER — FLUCONAZOLE 150 MG PO TABS
150.0000 mg | ORAL_TABLET | Freq: Every day | ORAL | Status: DC
  Administered 2019-10-15 – 2019-10-16 (×2): 150 mg via ORAL
  Filled 2019-10-15 (×2): qty 1

## 2019-10-15 MED ORDER — SODIUM CHLORIDE 0.9 % IR SOLN
Status: DC | PRN
  Administered 2019-10-15: 1000 mL

## 2019-10-15 MED ORDER — OXYCODONE HCL 5 MG/5ML PO SOLN: 5.0000 mg | Freq: Once | ORAL | Status: DC | PRN

## 2019-10-15 MED ORDER — METHOCARBAMOL 500 MG IVPB - SIMPLE MED
INTRAVENOUS | Status: AC
  Filled 2019-10-15: qty 50

## 2019-10-15 MED ORDER — METHOCARBAMOL 500 MG PO TABS
500.0000 mg | ORAL_TABLET | Freq: Four times a day (QID) | ORAL | Status: DC | PRN
  Administered 2019-10-16: 500 mg via ORAL
  Filled 2019-10-15: qty 1

## 2019-10-15 MED ORDER — ONDANSETRON HCL 4 MG/2ML IJ SOLN: 4.0000 mg | Freq: Four times a day (QID) | INTRAMUSCULAR | Status: DC | PRN

## 2019-10-15 MED ORDER — ORAL CARE MOUTH RINSE: 15.0000 mL | Freq: Once | OROMUCOSAL | Status: AC

## 2019-10-15 MED ORDER — FOLIC ACID 1 MG PO TABS
1.0000 mg | ORAL_TABLET | Freq: Every day | ORAL | Status: DC
  Administered 2019-10-16: 1 mg via ORAL
  Filled 2019-10-15 (×2): qty 1

## 2019-10-15 MED ORDER — PHENYLEPHRINE HCL-NACL 10-0.9 MG/250ML-% IV SOLN
INTRAVENOUS | Status: AC
  Filled 2019-10-15: qty 250

## 2019-10-15 MED ORDER — DEXAMETHASONE SODIUM PHOSPHATE 10 MG/ML IJ SOLN
INTRAMUSCULAR | Status: AC
  Filled 2019-10-15: qty 1

## 2019-10-15 MED ORDER — BISACODYL 10 MG RE SUPP
10.0000 mg | Freq: Every day | RECTAL | Status: DC | PRN
  Filled 2019-10-15: qty 1

## 2019-10-15 MED ORDER — ONDANSETRON HCL 4 MG PO TABS
4.0000 mg | ORAL_TABLET | Freq: Four times a day (QID) | ORAL | Status: DC | PRN
  Filled 2019-10-15: qty 1

## 2019-10-15 MED ORDER — POLYETHYLENE GLYCOL 3350 17 G PO PACK
17.0000 g | PACK | Freq: Two times a day (BID) | ORAL | Status: DC
  Administered 2019-10-16: 17 g via ORAL
  Filled 2019-10-15 (×3): qty 1

## 2019-10-15 MED ORDER — HYDROCODONE-ACETAMINOPHEN 5-325 MG PO TABS
ORAL_TABLET | ORAL | Status: AC
  Administered 2019-10-15: 1 via ORAL
  Filled 2019-10-15: qty 1

## 2019-10-15 MED ORDER — PROPOFOL 10 MG/ML IV BOLUS
INTRAVENOUS | Status: DC | PRN
  Administered 2019-10-15 (×2): 20 mg via INTRAVENOUS

## 2019-10-15 MED ORDER — TRANEXAMIC ACID-NACL 1000-0.7 MG/100ML-% IV SOLN
1000.0000 mg | Freq: Once | INTRAVENOUS | Status: AC
  Administered 2019-10-15: 1000 mg via INTRAVENOUS
  Filled 2019-10-15: qty 100

## 2019-10-15 MED ORDER — HYDROCODONE-ACETAMINOPHEN 7.5-325 MG PO TABS: 1.0000 | ORAL_TABLET | ORAL | Status: DC | PRN

## 2019-10-15 MED ORDER — ATORVASTATIN CALCIUM 20 MG PO TABS
20.0000 mg | ORAL_TABLET | Freq: Every day | ORAL | Status: DC
  Administered 2019-10-16: 20 mg via ORAL
  Filled 2019-10-15 (×2): qty 1

## 2019-10-15 MED ORDER — MAGNESIUM CITRATE PO SOLN
1.0000 | Freq: Once | ORAL | Status: DC | PRN
  Filled 2019-10-15: qty 296

## 2019-10-15 MED ORDER — PROPOFOL 10 MG/ML IV BOLUS
INTRAVENOUS | Status: AC
  Filled 2019-10-15: qty 20

## 2019-10-15 MED ORDER — METOPROLOL SUCCINATE ER 50 MG PO TB24
75.0000 mg | ORAL_TABLET | Freq: Every day | ORAL | Status: DC
  Administered 2019-10-16: 75 mg via ORAL
  Filled 2019-10-15 (×2): qty 1

## 2019-10-15 MED ORDER — ONDANSETRON HCL 4 MG/2ML IJ SOLN
INTRAMUSCULAR | Status: DC | PRN
  Administered 2019-10-15: 4 mg via INTRAVENOUS

## 2019-10-15 MED ORDER — LIDOCAINE 2% (20 MG/ML) 5 ML SYRINGE
INTRAMUSCULAR | Status: DC | PRN
  Administered 2019-10-15: 40 mg via INTRAVENOUS

## 2019-10-15 MED ORDER — NYSTATIN 100000 UNIT/GM EX CREA: TOPICAL_CREAM | Freq: Two times a day (BID) | CUTANEOUS | Status: DC

## 2019-10-15 MED ORDER — DEXAMETHASONE SODIUM PHOSPHATE 10 MG/ML IJ SOLN
10.0000 mg | Freq: Once | INTRAMUSCULAR | Status: AC
  Administered 2019-10-16: 10 mg via INTRAVENOUS
  Filled 2019-10-15: qty 1

## 2019-10-15 MED ORDER — DOCUSATE SODIUM 100 MG PO CAPS
100.0000 mg | ORAL_CAPSULE | Freq: Two times a day (BID) | ORAL | Status: DC
  Administered 2019-10-15 – 2019-10-16 (×2): 100 mg via ORAL
  Filled 2019-10-15 (×3): qty 1

## 2019-10-15 MED ORDER — ALUM & MAG HYDROXIDE-SIMETH 200-200-20 MG/5ML PO SUSP
15.0000 mL | ORAL | Status: DC | PRN
  Filled 2019-10-15: qty 30

## 2019-10-15 MED ORDER — POTASSIUM CHLORIDE CRYS ER 20 MEQ PO TBCR
40.0000 meq | EXTENDED_RELEASE_TABLET | Freq: Every day | ORAL | Status: DC
  Administered 2019-10-15 – 2019-10-16 (×2): 40 meq via ORAL
  Filled 2019-10-15 (×2): qty 2

## 2019-10-15 MED ORDER — TRAZODONE HCL 50 MG PO TABS
50.0000 mg | ORAL_TABLET | Freq: Every day | ORAL | Status: DC
  Administered 2019-10-15: 50 mg via ORAL
  Filled 2019-10-15: qty 1

## 2019-10-15 MED ORDER — METHOCARBAMOL 500 MG IVPB - SIMPLE MED
500.0000 mg | Freq: Four times a day (QID) | INTRAVENOUS | Status: DC | PRN
  Administered 2019-10-15: 500 mg via INTRAVENOUS
  Filled 2019-10-15: qty 50

## 2019-10-15 MED ORDER — PHENOL 1.4 % MT LIQD
1.0000 | OROMUCOSAL | Status: DC | PRN
  Filled 2019-10-15: qty 177

## 2019-10-15 MED ORDER — METOCLOPRAMIDE HCL 5 MG PO TABS
5.0000 mg | ORAL_TABLET | Freq: Three times a day (TID) | ORAL | Status: DC | PRN
  Filled 2019-10-15: qty 2

## 2019-10-15 MED ORDER — ESCITALOPRAM OXALATE 10 MG PO TABS
10.0000 mg | ORAL_TABLET | Freq: Every day | ORAL | Status: DC
  Administered 2019-10-15 – 2019-10-16 (×2): 10 mg via ORAL
  Filled 2019-10-15 (×2): qty 1

## 2019-10-15 MED ORDER — APIXABAN 2.5 MG PO TABS
2.5000 mg | ORAL_TABLET | Freq: Two times a day (BID) | ORAL | Status: DC
  Administered 2019-10-16: 2.5 mg via ORAL
  Filled 2019-10-15: qty 1

## 2019-10-15 MED ORDER — CELECOXIB 200 MG PO CAPS
200.0000 mg | ORAL_CAPSULE | Freq: Two times a day (BID) | ORAL | Status: DC
  Administered 2019-10-15 – 2019-10-16 (×2): 200 mg via ORAL
  Filled 2019-10-15 (×2): qty 1

## 2019-10-15 MED ORDER — SODIUM CHLORIDE 0.9 % IV SOLN: INTRAVENOUS | Status: DC

## 2019-10-15 MED ORDER — DEXAMETHASONE SODIUM PHOSPHATE 10 MG/ML IJ SOLN
10.0000 mg | Freq: Once | INTRAMUSCULAR | Status: AC
  Administered 2019-10-15: 10 mg via INTRAVENOUS

## 2019-10-15 MED ORDER — PANTOPRAZOLE SODIUM 40 MG PO TBEC
40.0000 mg | DELAYED_RELEASE_TABLET | Freq: Every day | ORAL | Status: DC
  Administered 2019-10-16: 40 mg via ORAL
  Filled 2019-10-15 (×2): qty 1

## 2019-10-15 MED ORDER — FERROUS SULFATE 325 (65 FE) MG PO TABS
325.0000 mg | ORAL_TABLET | Freq: Three times a day (TID) | ORAL | Status: DC
  Administered 2019-10-16 (×2): 325 mg via ORAL
  Filled 2019-10-15 (×3): qty 1

## 2019-10-15 MED ORDER — LORATADINE 10 MG PO TABS
10.0000 mg | ORAL_TABLET | Freq: Every day | ORAL | Status: DC
  Administered 2019-10-16: 10 mg via ORAL
  Filled 2019-10-15 (×2): qty 1

## 2019-10-15 MED ORDER — ONDANSETRON HCL 4 MG/2ML IJ SOLN: 4.0000 mg | Freq: Once | INTRAMUSCULAR | Status: DC | PRN

## 2019-10-15 MED ORDER — NYSTATIN 100000 UNIT/GM EX CREA
TOPICAL_CREAM | Freq: Two times a day (BID) | CUTANEOUS | Status: DC
  Filled 2019-10-15: qty 15

## 2019-10-15 MED ORDER — BUPIVACAINE IN DEXTROSE 0.75-8.25 % IT SOLN
INTRATHECAL | Status: DC | PRN
Start: 2019-10-15 — End: 2019-10-15
  Administered 2019-10-15: 1.4 mL via INTRATHECAL

## 2019-10-15 MED ORDER — FENTANYL CITRATE (PF) 100 MCG/2ML IJ SOLN
INTRAMUSCULAR | Status: DC | PRN
  Administered 2019-10-15 (×2): 50 ug via INTRAVENOUS

## 2019-10-15 MED ORDER — LACTATED RINGERS IV SOLN: INTRAVENOUS | Status: DC

## 2019-10-15 MED ORDER — CEFAZOLIN SODIUM-DEXTROSE 2-4 GM/100ML-% IV SOLN
2.0000 g | INTRAVENOUS | Status: AC
  Administered 2019-10-15: 2 g via INTRAVENOUS
  Filled 2019-10-15: qty 100

## 2019-10-15 MED ORDER — CHLORHEXIDINE GLUCONATE 0.12 % MT SOLN
15.0000 mL | Freq: Once | OROMUCOSAL | Status: AC
  Administered 2019-10-15: 15 mL via OROMUCOSAL

## 2019-10-15 MED ORDER — FENTANYL CITRATE (PF) 100 MCG/2ML IJ SOLN
INTRAMUSCULAR | Status: AC
  Filled 2019-10-15: qty 2

## 2019-10-15 MED ORDER — FENTANYL CITRATE (PF) 100 MCG/2ML IJ SOLN: 25.0000 ug | INTRAMUSCULAR | Status: DC | PRN

## 2019-10-15 MED ORDER — ACETAMINOPHEN 325 MG PO TABS
325.0000 mg | ORAL_TABLET | Freq: Four times a day (QID) | ORAL | Status: DC | PRN
  Administered 2019-10-16: 500 mg via ORAL

## 2019-10-15 MED ORDER — CEFAZOLIN SODIUM-DEXTROSE 2-4 GM/100ML-% IV SOLN
2.0000 g | Freq: Four times a day (QID) | INTRAVENOUS | Status: AC
  Administered 2019-10-15 (×2): 2 g via INTRAVENOUS
  Filled 2019-10-15 (×2): qty 100

## 2019-10-15 MED ORDER — FUROSEMIDE 40 MG PO TABS
40.0000 mg | ORAL_TABLET | Freq: Every day | ORAL | Status: DC | PRN
  Filled 2019-10-15: qty 1

## 2019-10-15 MED ORDER — TAMSULOSIN HCL 0.4 MG PO CAPS
0.4000 mg | ORAL_CAPSULE | Freq: Every day | ORAL | Status: DC
  Administered 2019-10-15: 0.4 mg via ORAL
  Filled 2019-10-15: qty 1

## 2019-10-15 MED ORDER — METOCLOPRAMIDE HCL 5 MG/ML IJ SOLN: 5.0000 mg | Freq: Three times a day (TID) | INTRAMUSCULAR | Status: DC | PRN

## 2019-10-15 MED ORDER — PROPOFOL 500 MG/50ML IV EMUL
INTRAVENOUS | Status: DC | PRN
  Administered 2019-10-15: 50 ug/kg/min via INTRAVENOUS

## 2019-10-15 MED ORDER — PROPOFOL 1000 MG/100ML IV EMUL
INTRAVENOUS | Status: AC
  Filled 2019-10-15: qty 100

## 2019-10-15 MED ORDER — ONDANSETRON HCL 4 MG/2ML IJ SOLN
INTRAMUSCULAR | Status: AC
  Filled 2019-10-15: qty 2

## 2019-10-15 MED ORDER — HYDROCODONE-ACETAMINOPHEN 5-325 MG PO TABS: 1.0000 | ORAL_TABLET | ORAL | Status: DC | PRN

## 2019-10-15 MED ORDER — DIPHENHYDRAMINE HCL 12.5 MG/5ML PO ELIX
12.5000 mg | ORAL_SOLUTION | ORAL | Status: DC | PRN
  Filled 2019-10-15: qty 10

## 2019-10-15 SURGICAL SUPPLY — 48 items
BAG DECANTER FOR FLEXI CONT (MISCELLANEOUS) IMPLANT
BAG ZIPLOCK 12X15 (MISCELLANEOUS) ×2 IMPLANT
BALL HIP ARTICU EZE 36 8.5 (Hips) ×1 IMPLANT
BLADE SAG 18X100X1.27 (BLADE) ×2 IMPLANT
BLADE SURG SZ10 CARB STEEL (BLADE) ×4 IMPLANT
COVER PERINEAL POST (MISCELLANEOUS) ×2 IMPLANT
COVER SURGICAL LIGHT HANDLE (MISCELLANEOUS) ×2 IMPLANT
COVER WAND RF STERILE (DRAPES) IMPLANT
CUP ACET PINNACLE SECTR 56MM (Hips) ×1 IMPLANT
DERMABOND ADVANCED (GAUZE/BANDAGES/DRESSINGS) ×1
DERMABOND ADVANCED .7 DNX12 (GAUZE/BANDAGES/DRESSINGS) ×1 IMPLANT
DRAPE STERI IOBAN 125X83 (DRAPES) ×2 IMPLANT
DRAPE U-SHAPE 47X51 STRL (DRAPES) ×4 IMPLANT
DRESSING AQUACEL AG SP 3.5X10 (GAUZE/BANDAGES/DRESSINGS) ×1 IMPLANT
DRSG AQUACEL AG ADV 3.5X10 (GAUZE/BANDAGES/DRESSINGS) ×2 IMPLANT
DRSG AQUACEL AG SP 3.5X10 (GAUZE/BANDAGES/DRESSINGS) ×2
DURAPREP 26ML APPLICATOR (WOUND CARE) ×2 IMPLANT
ELECT REM PT RETURN 15FT ADLT (MISCELLANEOUS) ×2 IMPLANT
ELIMINATOR HOLE APEX DEPUY (Hips) ×2 IMPLANT
GLOVE BIO SURGEON STRL SZ 6 (GLOVE) ×4 IMPLANT
GLOVE BIOGEL PI IND STRL 6.5 (GLOVE) ×1 IMPLANT
GLOVE BIOGEL PI IND STRL 7.5 (GLOVE) ×1 IMPLANT
GLOVE BIOGEL PI IND STRL 8.5 (GLOVE) ×1 IMPLANT
GLOVE BIOGEL PI INDICATOR 6.5 (GLOVE) ×1
GLOVE BIOGEL PI INDICATOR 7.5 (GLOVE) ×1
GLOVE BIOGEL PI INDICATOR 8.5 (GLOVE) ×1
GLOVE ECLIPSE 8.0 STRL XLNG CF (GLOVE) ×4 IMPLANT
GLOVE ORTHO TXT STRL SZ7.5 (GLOVE) ×4 IMPLANT
GOWN STRL REUS W/TWL LRG LVL3 (GOWN DISPOSABLE) ×4 IMPLANT
GOWN STRL REUS W/TWL XL LVL3 (GOWN DISPOSABLE) ×2 IMPLANT
HIP BALL ARTICU EZE 36 8.5 (Hips) ×2 IMPLANT
HOLDER FOLEY CATH W/STRAP (MISCELLANEOUS) ×2 IMPLANT
KIT TURNOVER KIT A (KITS) IMPLANT
PACK ANTERIOR HIP CUSTOM (KITS) ×2 IMPLANT
PENCIL SMOKE EVACUATOR (MISCELLANEOUS) IMPLANT
PINNACLE ALTRX PLUS 4 N 36X56 (Hips) ×2 IMPLANT
PINNACLE SECTOR CUP 56MM (Hips) ×2 IMPLANT
SCREW 6.5MMX30MM (Screw) ×2 IMPLANT
STEM FEM ACTIS STD SZ7 (Nail) ×2 IMPLANT
SUT MNCRL AB 4-0 PS2 18 (SUTURE) ×2 IMPLANT
SUT STRATAFIX 0 PDS 27 VIOLET (SUTURE) ×2
SUT VIC AB 1 CT1 36 (SUTURE) ×6 IMPLANT
SUT VIC AB 2-0 CT1 27 (SUTURE) ×2
SUT VIC AB 2-0 CT1 TAPERPNT 27 (SUTURE) ×2 IMPLANT
SUTURE STRATFX 0 PDS 27 VIOLET (SUTURE) ×1 IMPLANT
TRAY FOLEY MTR SLVR 16FR STAT (SET/KITS/TRAYS/PACK) ×2 IMPLANT
WATER STERILE IRR 1000ML POUR (IV SOLUTION) ×2 IMPLANT
YANKAUER SUCT BULB TIP 10FT TU (MISCELLANEOUS) IMPLANT

## 2019-10-15 NOTE — Anesthesia Procedure Notes (Signed)
Date/Time: 10/15/2019 8:38 AM Performed by: Talbot Grumbling, CRNA Oxygen Delivery Method: Simple face mask

## 2019-10-15 NOTE — Care Plan (Signed)
Ortho Bundle Case Management Note  Patient Details  Name: Eliott Amparan MRN: [id number] Date of Birth: 17-Aug-1940                  R THA on 10-15-19 DCP: home with son, Quillian Quince. 1 story home with 0 ste. DME: No needs. Has a RW and elevated toilets with grab bars. PT: HEP   DME Arranged:  N/A DME Agency:     HH Arranged:    [place] Agency:     Additional Comments: Please contact me with any questions of if this plan should need to change.  Marianne Sofia, RN,CCM [facility]  [phone] 10/15/2019, 11:00 AM

## 2019-10-15 NOTE — Progress Notes (Addendum)
Per Dr. Alvan Dame, ok to proceed with surgery after reviewing skin abrasions on R thigh, and yeast irritation in right groin.

## 2019-10-15 NOTE — Interval H&P Note (Signed)
History and Physical Interval Note:  10/15/2019 7:13 AM  Darryl Diaz  has presented today for surgery, with the diagnosis of Right hip osteoarthritis.  The various methods of treatment have been discussed with the patient and family. After consideration of risks, benefits and other options for treatment, the patient has consented to  Procedure(s) with comments: TOTAL HIP ARTHROPLASTY ANTERIOR APPROACH (Right) - 70 mins as a surgical intervention.  The patient's history has been reviewed, patient examined, no change in status, stable for surgery.  I have reviewed the patient's chart and labs.  Questions were answered to the patient's satisfaction.     [name]

## 2019-10-15 NOTE — Anesthesia Procedure Notes (Signed)
Spinal  Patient location during procedure: OR Start time: 10/15/2019 8:32 AM End time: 10/15/2019 8:37 AM Staffing Performed: anesthesiologist  Anesthesiologist: Audry Pili, MD Preanesthetic Checklist Completed: patient identified, IV checked, risks and benefits discussed, surgical consent, monitors and equipment checked, pre-op evaluation and timeout performed Spinal Block Patient position: sitting Prep: DuraPrep Patient monitoring: heart rate, cardiac monitor, continuous pulse ox and blood pressure Approach: midline Location: L3-4 Injection technique: single-shot Needle Needle type: Quincke  Needle gauge: 22 G Additional Notes Consent was obtained prior to the procedure with all questions answered and concerns addressed. Risks including, but not limited to, bleeding, infection, nerve damage, paralysis, failed block, inadequate analgesia, allergic reaction, high spinal, itching, and headache were discussed and the patient wished to proceed. Functioning IV was confirmed and monitors were applied. Sterile prep and drape, including hand hygiene, mask, and sterile gloves were used. The patient was positioned and the spine was prepped. The skin was anesthetized with lidocaine. Free flow of clear CSF was obtained prior to injecting local anesthetic into the CSF. The spinal needle aspirated freely following injection. The needle was carefully withdrawn. The patient tolerated the procedure well.   Renold Don, MD

## 2019-10-15 NOTE — Anesthesia Postprocedure Evaluation (Signed)
Anesthesia Post Note  Patient: Darryl Diaz  Procedure(s) Performed: TOTAL HIP ARTHROPLASTY ANTERIOR APPROACH (Right Hip)     Patient location during evaluation: PACU Anesthesia Type: Spinal Level of consciousness: awake and alert Pain management: pain level controlled Vital Signs Assessment: post-procedure vital signs reviewed and stable Respiratory status: spontaneous breathing and respiratory function stable Cardiovascular status: blood pressure returned to baseline and stable Postop Assessment: spinal receding and no apparent nausea or vomiting Anesthetic complications: no    Last Vitals:  Vitals:   10/15/19 1130 10/15/19 1200  BP: (!) 154/79 (!) 138/99  Pulse: 70 72  Resp: 16 18  Temp: 36.6 C 36.6 C  SpO2: 100% 100%    Last Pain:  Vitals:   10/15/19 1200  TempSrc:   PainSc: [place]

## 2019-10-15 NOTE — Progress Notes (Signed)
Orthopedic Tech Progress Note Patient Details:  Darryl Diaz [birth year] [id number]  Ortho Devices Ortho Device/Splint Location: Trapeze bar Ortho Device/Splint Interventions: Application   Post Interventions Patient Tolerated: Well Instructions Provided: Care of device   [place] Pink 10/15/2019, 5:05 PM

## 2019-10-15 NOTE — Addendum Note (Signed)
Addendum  created 10/15/19 1419 by Talbot Grumbling, CRNA   Flowsheet accepted, Intraprocedure Flowsheets edited

## 2019-10-15 NOTE — Evaluation (Signed)
Physical Therapy Evaluation Patient Details Name: Darryl Diaz MRN: [id number] DOB: 02-27-1941 Today's Date: 10/15/2019   History of Present Illness  Patient is 79 y.o. male s/p Rt THA anterior approach on 10/15/19 wiht PMH significant for OSA, MI, HTN, HLD, GERD, Fatty liver, A-fib, anemia, Lt THA in 2017.    Clinical Impression  Darryl Diaz is a 79 y.o. male POD 0 s/p Rt THA. Patient reports independence with use of RW/Rollator with mobility at baseline. Patient is now limited by functional impairments (see PT problem list below) and requires min assist for transfers and gait with RW. Patient was able to ambulate ~30 feet with RW and min assist. Patient instructed in exercise to facilitate ROM and circulation. Patient will benefit from continued skilled PT interventions to address impairments and progress towards PLOF. Acute PT will follow to progress mobility and stair training in preparation for safe discharge home.     Follow Up Recommendations Follow surgeon's recommendation for DC plan and follow-up therapies;Home health PT    Equipment Recommendations  None recommended by PT    Recommendations for Other Services       Precautions / Restrictions Precautions Precautions: Fall Precaution Comments: pt has has at least 6 falls in last 6 months Restrictions Weight Bearing Restrictions: No Other Position/Activity Restrictions: WBAT      Mobility  Bed Mobility Overal bed mobility: Needs Assistance Bed Mobility: Supine to Sit     Supine to sit: Min assist;HOB elevated     General bed mobility comments: cues for sequencing with use of bed rail, assist for Rt LE mobility to move to EOB.   Transfers Overall transfer level: Needs assistance Equipment used: Rolling walker (2 wheeled) Transfers: Sit to/from Stand Sit to Stand: Min assist;From elevated surface         General transfer comment: cues for technique with RW, assist required to complete rise and steady with hand  transition to walker from bed.  Ambulation/Gait Ambulation/Gait assistance: Min assist Gait Distance (Feet): 30 Feet Assistive device: Rolling walker (2 wheeled) Gait Pattern/deviations: Step-to pattern;Decreased stride length;Decreased weight shift to right Gait velocity: decreased   General Gait Details: cues for safe step pattern and proximity to RW, assist to manage walker position. pt very shakey with gait and appeared to have a tremor with bil UE while moving the RW requiring assist to stabilize.  Stairs       Wheelchair Mobility    Modified Rankin (Stroke Patients Only)       Balance Overall balance assessment: Needs assistance Sitting-balance support: Feet supported Sitting balance-Leahy Scale: Good     Standing balance support: During functional activity;Bilateral upper extremity supported Standing balance-Leahy Scale: Poor             Pertinent Vitals/Pain Pain Assessment: 0-10 Pain Score: 4  Pain Location: Rt hip Pain Descriptors / Indicators: Aching;Discomfort Pain Intervention(s): Limited activity within patient's tolerance;Monitored during session;Repositioned;Ice applied    Home Living Family/patient expects to be discharged to:: Private residence Living Arrangements: Alone Available Help at Discharge: Family;Available PRN/intermittently(pt's son is coming from [place] to stay for 15 days) Type of Home: House Home Access: Level entry     Home Layout: Two level;Able to live on main level with bedroom/bathroom;Full bath on main level Home Equipment: Walker - 2 wheels;Walker - 4 wheels;Bedside commode Additional Comments: pt drives and does his own grocery shopping. patient reports prior to COVID he was swimming 2-3x/week at [facility] and would like to get back to that.  Prior Function Level of Independence: Independent with assistive device(s)         Comments: has been using RW and rollator at home     Hand Dominance   Dominant Hand: Right     Extremity/Trunk Assessment   Upper Extremity Assessment Upper Extremity Assessment: Overall WFL for tasks assessed    Lower Extremity Assessment Lower Extremity Assessment: Generalized weakness    Cervical / Trunk Assessment Cervical / Trunk Assessment: Normal  Communication   Communication: No difficulties  Cognition Arousal/Alertness: Awake/alert Behavior During Therapy: WFL for tasks assessed/performed Overall Cognitive Status: Within Functional Limits for tasks assessed           General Comments      Exercises Total Joint Exercises Ankle Circles/Pumps: AROM;Both;20 reps;Seated Quad Sets: AROM;Right;Seated;10 reps Heel Slides: AROM;Right;5 reps;Seated   Assessment/Plan    PT Assessment Patient needs continued PT services  PT Problem List Decreased strength;Decreased range of motion;Decreased activity tolerance;Decreased balance;Decreased mobility;Decreased knowledge of use of DME;Decreased knowledge of precautions       PT Treatment Interventions DME instruction;Gait training;Stair training;Functional mobility training;Therapeutic activities;Therapeutic exercise;Balance training;Patient/family education    PT Goals (Current goals can be found in the Care Plan section)  Acute Rehab PT Goals Patient Stated Goal: to get back to swimming at [facility] PT Goal Formulation: With patient Time For Goal Achievement: 10/22/19 Potential to Achieve Goals: Good    Frequency 7X/week    AM-PAC PT "6 Clicks" Mobility  Outcome Measure Help needed turning from your back to your side while in a flat bed without using bedrails?: A Little Help needed moving from lying on your back to sitting on the side of a flat bed without using bedrails?: A Little Help needed moving to and from a bed to a chair (including a wheelchair)?: A Little Help needed standing up from a chair using your arms (e.g., wheelchair or bedside chair)?: A Little Help needed to walk in hospital room?: A Little Help  needed climbing 3-5 steps with a railing? : A Little 6 Click Score: 18    End of Session Equipment Utilized During Treatment: Gait belt Activity Tolerance: Patient tolerated treatment well Patient left: in chair;with call bell/phone within reach;with chair alarm set Nurse Communication: Mobility status PT Visit Diagnosis: Muscle weakness (generalized) (M62.81);Difficulty in walking, not elsewhere classified (R26.2)    Time: 6283-1517 PT Time Calculation (min) (ACUTE ONLY): 25 min   Charges:   PT Evaluation $PT Eval Low Complexity: 1 Low PT Treatments $Therapeutic Exercise: 8-22 mins       Verner Mould, DPT Physical Therapist with [facility] [phone]  10/15/2019 4:51 PM

## 2019-10-15 NOTE — Op Note (Signed)
NAME:  Darryl Diaz                ACCOUNT NO.: [id number]      MEDICAL RECORD NO.: [id number]      FACILITY:  [facility]      PHYSICIAN:  [name]  DATE OF BIRTH:  [birth year]     DATE OF PROCEDURE:  10/15/2019                                 OPERATIVE REPORT         PREOPERATIVE DIAGNOSIS: Right  hip osteoarthritis.      POSTOPERATIVE DIAGNOSIS:  Right hip osteoarthritis.      PROCEDURE:  Right total hip replacement through an anterior approach   utilizing DePuy THR system, component size 56 mm pinnacle cup, a size 36+4 neutral   Altrex liner, a size 7 standard Actis stem with a 36+8.5 Articleze metal ball.      SURGEON:  Pietro Cassis. Alvan Dame, M.D.      ASSISTANT:  Danae Orleans, PA-C     ANESTHESIA:  Spinal.      SPECIMENS:  None.      COMPLICATIONS:  None.      BLOOD LOSS:  350 cc     DRAINS:  None.      INDICATION OF THE PROCEDURE:  Darryl Diaz is a 79 y.o. male who had   presented to office for evaluation of right hip pain.  Radiographs revealed   progressive degenerative changes with bone-on-bone   articulation of the  hip joint, including subchondral cystic changes and osteophytes.  The patient had painful limited range of   motion significantly affecting their overall quality of life and function.  The patient was failing to    respond to conservative measures including medications and/or injections and activity modification and at this point was ready   to proceed with more definitive measures.  Consent was obtained for   benefit of pain relief.  Specific risks of infection, DVT, component   failure, dislocation, neurovascular injury, and need for revision surgery were reviewed in the office as well discussion of   the anterior versus posterior approach were reviewed.     PROCEDURE IN DETAIL:  The patient was brought to operative theater.   Once adequate anesthesia, preoperative antibiotics, 2 gm of Ancef, 1 gm of Tranexamic Acid, and  10 mg of Decadron were administered, the patient was positioned supine on the [employer] table.  Once the patient was safely positioned with adequate padding of boney prominences we predraped out the hip, and used fluoroscopy to confirm orientation of the pelvis.      The right hip was then prepped and draped from proximal iliac crest to   mid thigh with a shower curtain technique.      Time-out was performed identifying the patient, planned procedure, and the appropriate extremity.     An incision was then made 2 cm lateral to the   anterior superior iliac spine extending over the orientation of the   tensor fascia lata muscle and sharp dissection was carried down to the   fascia of the muscle.      The fascia was then incised.  The muscle belly was identified and swept   laterally and retractor placed along the superior neck.  Following   cauterization of the circumflex vessels and removing some pericapsular   fat,  a second cobra retractor was placed on the inferior neck.  A T-capsulotomy was made along the line of the   superior neck to the trochanteric fossa, then extended proximally and   distally.  Tag sutures were placed and the retractors were then placed   intracapsular.  We then identified the trochanteric fossa and   orientation of my neck cut and then made a neck osteotomy with the femur on traction.  The femoral   head was removed without difficulty or complication.  Traction was let   off and retractors were placed posterior and anterior around the   acetabulum.      The labrum and foveal tissue were debrided.  I began reaming with a 47 mm   reamer and reamed up to 55 mm reamer with good bony bed preparation and a 56 mm  cup was chosen.  The final 56 mm Pinnacle cup was then impacted under fluoroscopy to confirm the depth of penetration and orientation with respect to   Abduction and forward flexion.  A screw was placed into the ilium followed by the hole eliminator.  The  final   36+4 neutral Altrex liner was impacted with good visualized rim fit.  The cup was positioned anatomically within the acetabular portion of the pelvis.      At this point, the femur was rolled to 100 degrees.  Further capsule was   released off the inferior aspect of the femoral neck.  I then   released the superior capsule proximally.  With the leg in a neutral position the hook was placed laterally   along the femur under the vastus lateralis origin and elevated manually and then held in position using the hook attachment on the bed.  The leg was then extended and adducted with the leg rolled to 100   degrees of external rotation.  Retractors were placed along the medial calcar and posteriorly over the greater trochanter.  Once the proximal femur was fully   exposed, I used a box osteotome to set orientation.  I then began   broaching with the starting chili pepper broach and passed this by hand and then broached up to 7.  With the 7 broach in place I chose a standard neck and did several trial reductions.  The offset was appropriate, leg lengths   appeared to be equal best matched with the +8.5 head ball trial confirmed radiographically.   Given these findings, I went ahead and dislocated the hip, repositioned all   retractors and positioned the right hip in the extended and abducted position.  The final 7 standard Actis stem was   chosen and it was impacted down to the level of neck cut.  Based on this   and the trial reductions, a final 36+8.5 Articuleze metal head ball was chosen and   impacted onto a clean and dry trunnion, and the hip was reduced.  The   hip had been irrigated throughout the case again at this point.  I did   reapproximate the superior capsular leaflet to the anterior leaflet   using #1 Vicryl.  The fascia of the   tensor fascia lata muscle was then reapproximated using #1 Vicryl and #0 Stratafix sutures.  The   remaining wound was closed with 2-0 Vicryl and  running 4-0 Monocryl.   The hip was cleaned, dried, and dressed sterilely using Dermabond and   Aquacel dressing.  The patient was then brought   to recovery room in stable  condition tolerating the procedure well.    Danae Orleans, PA-C was present for the entirety of the case involved from   preoperative positioning, perioperative retractor management, general   facilitation of the case, as well as primary wound closure as assistant.            Pietro Cassis Alvan Dame, M.D.        10/15/2019 8:51 AM

## 2019-10-15 NOTE — Transfer of Care (Signed)
Immediate Anesthesia Transfer of Care Note  Patient: Darryl Diaz  Procedure(s) Performed: TOTAL HIP ARTHROPLASTY ANTERIOR APPROACH (Right Hip)  Patient Location: PACU  Anesthesia Type:Spinal  Level of Consciousness: awake, alert  and oriented  Airway & Oxygen Therapy: Patient Spontanous Breathing and Patient connected to face mask oxygen  Post-op Assessment: Report given to RN and Post -op Vital signs reviewed and stable  Post vital signs: Reviewed and stable  Last Vitals:  Vitals Value Taken Time  BP    Temp    Pulse 49 10/15/19 1027  Resp 18 10/15/19 1027  SpO2 84 % 10/15/19 1027  Vitals shown include unvalidated device data.  Last Pain:  Vitals:   10/15/19 0634  TempSrc:   PainSc: 4       Patients Stated Pain Goal: 4 (04/88/89 1694)  Complications: No apparent anesthesia complications

## 2019-10-15 NOTE — Discharge Instructions (Signed)
INSTRUCTIONS AFTER JOINT REPLACEMENT   o Remove items at home which could result in a fall. This includes throw rugs or furniture in walking pathways o ICE to the affected joint every three hours while awake for 30 minutes at a time, for at least the first 3-5 days, and then as needed for pain and swelling.  Continue to use ice for pain and swelling. You may notice swelling that will progress down to the foot and ankle.  This is normal after surgery.  Elevate your leg when you are not up walking on it.   o Continue to use the breathing machine you got in the hospital (incentive spirometer) which will help keep your temperature down.  It is common for your temperature to cycle up and down following surgery, especially at night when you are not up moving around and exerting yourself.  The breathing machine keeps your lungs expanded and your temperature down.   DIET:  As you were doing prior to hospitalization, we recommend a well-balanced diet.  DRESSING / WOUND CARE / SHOWERING  Keep the surgical dressing until follow up.  The dressing is water proof, so you can shower without any extra covering.  IF THE DRESSING FALLS OFF or the wound gets wet inside, change the dressing with sterile gauze.  Please use good hand washing techniques before changing the dressing.  Do not use any lotions or creams on the incision until instructed by your surgeon.    ACTIVITY  o Increase activity slowly as tolerated, but follow the weight bearing instructions below.   o No driving for 6 weeks or until further direction given by your physician.  You cannot drive while taking narcotics.  o No lifting or carrying greater than 10 lbs. until further directed by your surgeon. o Avoid periods of inactivity such as sitting longer than an hour when not asleep. This helps prevent blood clots.  o You may return to work once you are authorized by your doctor.     WEIGHT BEARING   Weight bearing as tolerated with assist  device (walker, cane, etc) as directed, use it as long as suggested by your surgeon or therapist, typically at least 4-6 weeks.   EXERCISES  Results after joint replacement surgery are often greatly improved when you follow the exercise, range of motion and muscle strengthening exercises prescribed by your doctor. Safety measures are also important to protect the joint from further injury. Any time any of these exercises cause you to have increased pain or swelling, decrease what you are doing until you are comfortable again and then slowly increase them. If you have problems or questions, call your caregiver or physical therapist for advice.   Rehabilitation is important following a joint replacement. After just a few days of immobilization, the muscles of the leg can become weakened and shrink (atrophy).  These exercises are designed to build up the tone and strength of the thigh and leg muscles and to improve motion. Often times heat used for twenty to thirty minutes before working out will loosen up your tissues and help with improving the range of motion but do not use heat for the first two weeks following surgery (sometimes heat can increase post-operative swelling).   These exercises can be done on a training (exercise) mat, on the floor, on a table or on a bed. Use whatever works the best and is most comfortable for you.    Use music or television while you are exercising so that   the exercises are a pleasant break in your day. This will make your life better with the exercises acting as a break in your routine that you can look forward to.   Perform all exercises about fifteen times, three times per day or as directed.  You should exercise both the operative leg and the other leg as well.  Exercises include:   . Quad Sets - Tighten up the muscle on the front of the thigh (Quad) and hold for 5-10 seconds.   . Straight Leg Raises - With your knee straight (if you were given a brace, keep it on),  lift the leg to 60 degrees, hold for 3 seconds, and slowly lower the leg.  Perform this exercise against resistance later as your leg gets stronger.  . Leg Slides: Lying on your back, slowly slide your foot toward your buttocks, bending your knee up off the floor (only go as far as is comfortable). Then slowly slide your foot back down until your leg is flat on the floor again.  . Angel Wings: Lying on your back spread your legs to the side as far apart as you can without causing discomfort.  . Hamstring Strength:  Lying on your back, push your heel against the floor with your leg straight by tightening up the muscles of your buttocks.  Repeat, but this time bend your knee to a comfortable angle, and push your heel against the floor.  You may put a pillow under the heel to make it more comfortable if necessary.   A rehabilitation program following joint replacement surgery can speed recovery and prevent re-injury in the future due to weakened muscles. Contact your doctor or a physical therapist for more information on knee rehabilitation.    CONSTIPATION  Constipation is defined medically as fewer than three stools per week and severe constipation as less than one stool per week.  Even if you have a regular bowel pattern at home, your normal regimen is likely to be disrupted due to multiple reasons following surgery.  Combination of anesthesia, postoperative narcotics, change in appetite and fluid intake all can affect your bowels.   YOU MUST use at least one of the following options; they are listed in order of increasing strength to get the job done.  They are all available over the counter, and you may need to use some, POSSIBLY even all of these options:    Drink plenty of fluids (prune juice may be helpful) and high fiber foods Colace 100 mg by mouth twice a day  Senokot for constipation as directed and as needed Dulcolax (bisacodyl), take with full glass of water  Miralax (polyethylene glycol)  once or twice a day as needed.  If you have tried all these things and are unable to have a bowel movement in the first 3-4 days after surgery call either your surgeon or your primary doctor.    If you experience loose stools or diarrhea, hold the medications until you stool forms back up.  If your symptoms do not get better within 1 week or if they get worse, check with your doctor.  If you experience "the worst abdominal pain ever" or develop nausea or vomiting, please contact the office immediately for further recommendations for treatment.   ITCHING:  If you experience itching with your medications, try taking only a single pain pill, or even half a pain pill at a time.  You can also use Benadryl over the counter for itching or also to   help with sleep.   TED HOSE STOCKINGS:  Use stockings on both legs until for at least 2 weeks or as directed by physician office. They may be removed at night for sleeping.  MEDICATIONS:  See your medication summary on the "After Visit Summary" that nursing will review with you.  You may have some home medications which will be placed on hold until you complete the course of blood thinner medication.  It is important for you to complete the blood thinner medication as prescribed.  PRECAUTIONS:  If you experience chest pain or shortness of breath - call 911 immediately for transfer to the hospital emergency department.   If you develop a fever greater that 101 F, purulent drainage from wound, increased redness or drainage from wound, foul odor from the wound/dressing, or calf pain - CONTACT YOUR SURGEON.                                                   FOLLOW-UP APPOINTMENTS:  If you do not already have a post-op appointment, please call the office for an appointment to be seen by your surgeon.  Guidelines for how soon to be seen are listed in your "After Visit Summary", but are typically between 1-4 weeks after surgery.  OTHER INSTRUCTIONS:   Knee  Replacement:  Do not place pillow under knee, focus on keeping the knee straight while resting.    DENTAL ANTIBIOTICS:  In most cases prophylactic antibiotics for Dental procdeures after total joint surgery are not necessary.  Exceptions are as follows:  1. History of prior total joint infection  2. Severely immunocompromised (Organ Transplant, cancer chemotherapy, Rheumatoid biologic meds such as Humera)  3. Poorly controlled diabetes (A1C &gt; 8.0, blood glucose over 200)  If you have one of these conditions, contact your surgeon for an antibiotic prescription, prior to your dental procedure.   MAKE SURE YOU:  . Understand these instructions.  . Get help right away if you are not doing well or get worse.    Thank you for letting us be a part of your medical care team.  It is a privilege we respect greatly.  We hope these instructions will help you stay on track for a fast and full recovery!    

## 2019-10-16 ENCOUNTER — Encounter: Payer: Self-pay | Admitting: *Deleted

## 2019-10-16 DIAGNOSIS — E785 Hyperlipidemia, unspecified: Secondary | ICD-10-CM | POA: Diagnosis not present

## 2019-10-16 DIAGNOSIS — I10 Essential (primary) hypertension: Secondary | ICD-10-CM | POA: Diagnosis not present

## 2019-10-16 DIAGNOSIS — D649 Anemia, unspecified: Secondary | ICD-10-CM | POA: Diagnosis not present

## 2019-10-16 DIAGNOSIS — M6281 Muscle weakness (generalized): Secondary | ICD-10-CM | POA: Diagnosis not present

## 2019-10-16 DIAGNOSIS — F325 Major depressive disorder, single episode, in full remission: Secondary | ICD-10-CM | POA: Diagnosis not present

## 2019-10-16 DIAGNOSIS — M1611 Unilateral primary osteoarthritis, right hip: Secondary | ICD-10-CM | POA: Diagnosis not present

## 2019-10-16 LAB — BASIC METABOLIC PANEL
Anion gap: 9 (ref 5–15)
BUN: 17 mg/dL (ref 8–23)
CO2: 24 mmol/L (ref 22–32)
Calcium: 8.5 mg/dL — ABNORMAL LOW (ref 8.9–10.3)
Chloride: 103 mmol/L (ref 98–111)
Creatinine, Ser: 1.06 mg/dL (ref 0.61–1.24)
GFR calc Af Amer: >60 mL/min (ref >60)
GFR calc non Af Amer: >60 mL/min (ref >60)
Glucose, Bld: 143 mg/dL — ABNORMAL HIGH (ref 70–99)
Potassium: 4.3 mmol/L (ref 3.5–5.1)
Sodium: 136 mmol/L (ref 135–145)

## 2019-10-16 LAB — CBC
HCT: 25.1 % — ABNORMAL LOW (ref 39.0–52.0)
Hemoglobin: 8.1 g/dL — ABNORMAL LOW (ref 13.0–17.0)
MCH: 31.9 pg (ref 26.0–34.0)
MCHC: 32.3 g/dL (ref 30.0–36.0)
MCV: 98.8 fL (ref 80.0–100.0)
Platelets: 163 10*3/uL (ref 150–400)
RBC: 2.54 MIL/uL — ABNORMAL LOW (ref 4.22–5.81)
RDW: 14.4 % (ref 11.5–15.5)
WBC: 11.7 10*3/uL — ABNORMAL HIGH (ref 4.0–10.5)
nRBC: 0 % (ref 0.0–0.2)

## 2019-10-16 MED ORDER — METHOCARBAMOL 500 MG PO TABS: 500.0000 mg | ORAL_TABLET | Freq: Four times a day (QID) | ORAL | 0 refills | Status: DC | PRN

## 2019-10-16 MED ORDER — CEPHALEXIN 500 MG PO CAPS: 500.0000 mg | ORAL_CAPSULE | Freq: Three times a day (TID) | ORAL | 0 refills | Status: AC

## 2019-10-16 MED ORDER — POLYETHYLENE GLYCOL 3350 17 G PO PACK
17.0000 g | PACK | Freq: Two times a day (BID) | ORAL | 0 refills | Status: DC
Start: 2019-10-16 — End: 2020-02-07

## 2019-10-16 MED ORDER — HYDROCODONE-ACETAMINOPHEN 5-325 MG PO TABS: 1.0000 | ORAL_TABLET | Freq: Four times a day (QID) | ORAL | 0 refills | Status: DC | PRN

## 2019-10-16 MED ORDER — FERROUS SULFATE 325 (65 FE) MG PO TABS: 325.0000 mg | ORAL_TABLET | Freq: Three times a day (TID) | ORAL | 0 refills | Status: DC

## 2019-10-16 MED ORDER — DOCUSATE SODIUM 100 MG PO CAPS
100.0000 mg | ORAL_CAPSULE | Freq: Two times a day (BID) | ORAL | 0 refills | Status: DC
Start: 2019-10-16 — End: 2020-02-07

## 2019-10-16 NOTE — Plan of Care (Signed)
Plan of care reviewed and discussed with the patient. 

## 2019-10-16 NOTE — Progress Notes (Signed)
Patient discharged to home w/ sister. Given all belongings, instructions, equipment. Verbalized understanding of all instructions. Escorted to pov via w/c.

## 2019-10-16 NOTE — Progress Notes (Signed)
Patient has large dry callus on left great toe, present upon admission. During ambulation w/ therapy, part of callus pulled up w/ mild bleeding. Cleaned, applied silicone dressing. Patient tolerated well.

## 2019-10-16 NOTE — Progress Notes (Signed)
Physical Therapy Treatment Patient Details Name: Darryl Diaz MRN: [id number] DOB: [birth year] Today's Date: 10/16/2019    History of Present Illness Patient is 79 y.o. male s/p Rt THA anterior approach on 10/15/19 wiht PMH significant for OSA, MI, HTN, HLD, GERD, Fatty liver, A-fib, anemia, Lt THA in 2017.    PT Comments    Pt ambulated in hallway, mildly unsteady however no overt LOB or assist required.  Recommended pt have son close by when assisting at home.  Pt also performed standing exercises with bil UE support and provided with HEP program.  Pt reports his sister is bringing him home and his son will be there to assist for 15 days. Pt had no further questions and feels ready for d/c home today.   Follow Up Recommendations  Follow surgeon's recommendation for DC plan and follow-up therapies;Home health PT     Equipment Recommendations  None recommended by PT    Recommendations for Other Services       Precautions / Restrictions Precautions Precautions: Fall Precaution Comments: pt reports hx of falls (states mostly due to esophageal bleed, had low HGB and didn't know) Restrictions Other Position/Activity Restrictions: WBAT    Mobility  Bed Mobility Overal bed mobility: Needs Assistance Bed Mobility: Supine to Sit     Supine to sit: Min guard     General bed mobility comments: pt up in recliner  Transfers Overall transfer level: Needs assistance Equipment used: Rolling walker (2 wheeled) Transfers: Sit to/from Stand Sit to Stand: Supervision         General transfer comment: cues for technique with RW  Ambulation/Gait Ambulation/Gait assistance: Min guard;Supervision Gait Distance (Feet): 90 Feet Assistive device: Rolling walker (2 wheeled) Gait Pattern/deviations: Step-to pattern;Decreased weight shift to right;Antalgic;Step-through pattern     General Gait Details: verbal cues for sequence, RW positioning; fatigued quickly so distance limited, pt  required seated rest break prior to standing again for exercises   Stairs             Wheelchair Mobility    Modified Rankin (Stroke Patients Only)       Balance                                            Cognition Arousal/Alertness: Awake/alert Behavior During Therapy: WFL for tasks assessed/performed Overall Cognitive Status: Within Functional Limits for tasks assessed                                        Exercises Total Joint Exercises Ankle Circles/Pumps: AROM;Both;10 reps Quad Sets: AROM;Right;10 reps Short Arc Quad: AROM;Right;10 reps Heel Slides: Right;10 reps;AAROM Hip ABduction/ADduction: AROM;10 reps;Right;Standing Knee Flexion: AROM;Right;10 reps;Other (comment);Standing(standing exercises performed with bil UE support) Marching in Standing: AROM;10 reps;Standing;Right Standing Hip Extension: AROM;Right;Standing;10 reps    General Comments        Pertinent Vitals/Pain Pain Assessment: 0-10 Pain Score: 3  Pain Location: Rt hip Pain Descriptors / Indicators: Aching;Discomfort Pain Intervention(s): Repositioned;Monitored during session    Home Living                      Prior Function            PT Goals (current goals can now be found in the care plan section) Progress towards  PT goals: Progressing toward goals    Frequency    7X/week      PT Plan Current plan remains appropriate    Co-evaluation              AM-PAC PT "6 Clicks" Mobility   Outcome Measure  Help needed turning from your back to your side while in a flat bed without using bedrails?: A Little Help needed moving from lying on your back to sitting on the side of a flat bed without using bedrails?: A Little Help needed moving to and from a bed to a chair (including a wheelchair)?: A Little Help needed standing up from a chair using your arms (e.g., wheelchair or bedside chair)?: A Little Help needed to walk in  hospital room?: A Little Help needed climbing 3-5 steps with a railing? : A Little 6 Click Score: 18    End of Session Equipment Utilized During Treatment: Gait belt Activity Tolerance: Patient tolerated treatment well Patient left: in chair;with call bell/phone within reach;with chair alarm set Nurse Communication: Mobility status PT Visit Diagnosis: Muscle weakness (generalized) (M62.81);Difficulty in walking, not elsewhere classified (R26.2)     Time: 4935-5217 PT Time Calculation (min) (ACUTE ONLY): 13 min  Charges:  $Gait Training: 8-22 mins                     Arlyce Dice, DPT Acute Rehabilitation Services Pager: [phone] Office: [phone]  York Ram E 10/16/2019, 1:57 PM

## 2019-10-16 NOTE — Progress Notes (Signed)
     Subjective: 1 Day Post-Op Procedure(s) (LRB): TOTAL HIP ARTHROPLASTY ANTERIOR APPROACH (Right)   Patient reports pain as mild, pain controlled with medication.  No reported events throughout the night.  Dr. Alvan Dame discussed continuation of the cream for his yeast infection.  Reviewed his labs and his low hemoglobin, will continue taking iron at home.  Discussed the patient's big toe that does not look so good and has been told to follow-up with his PCP with regards to this area.  Due to the tail we will also place him on antibiotics for 2 weeks.  Patient is ready be discharged home, if he does well therapy.  Patient follow-up in the clinic in 2 weeks.  Patient knows to call with any questions or concerns.     Objective:   VITALS:   Vitals:   10/16/19 0214 10/16/19 0449  BP: 128/62 (!) 142/60  Pulse: 70 64  Resp: 14 16  Temp: 98.8 F (37.1 C) 98.5 F (36.9 C)  SpO2: 97% 97%    Dorsiflexion/Plantar flexion intact Incision: dressing C/D/I No cellulitis present Compartment soft  LABS Recent Labs    10/16/19 0257  HGB 8.1*  HCT 25.1*  WBC 11.7*  PLT 163    Recent Labs    10/16/19 0257  NA 136  K 4.3  BUN 17  CREATININE 1.06  GLUCOSE 143*     Assessment/Plan: 1 Day Post-Op Procedure(s) (LRB): TOTAL HIP ARTHROPLASTY ANTERIOR APPROACH (Right) Foley cath d/c'ed Advance diet Up with therapy D/C IV fluids Discharge home Follow up in 2 weeks at [facility] Follow up with OLIN,Benjaman Artman D in 2 weeks.  Contact information:  [facility] [address], [address] [place] [phone]     Obese (BMI 30-39.9) Estimated body mass index is 31.38 kg/m as calculated from the following:   Height as of this encounter: 6' (1.829 m).   Weight as of this encounter: 105 kg. Patient also counseled that weight may inhibit the healing process Patient counseled that losing weight will help with future health issues         Danae Orleans PA-C  [facility]  [place] [address], [address], [place], [place] [address] Phone: [phone] [email or web address] Facebook  [employer]

## 2019-10-16 NOTE — Progress Notes (Signed)
Pt is Ortho Bundle.  Met and confirmed with him plan for HEP and has received all DME prior to surgery. Plan for son to stay with him ~ 15 days to assist.  No TOC needs.  Taylor Spilde, LCSW

## 2019-10-16 NOTE — Discharge Summary (Signed)
Patient ID: Darryl Diaz MRN: [id number] DOB/AGE: 12-Sep-1940 79 y.o.  Admit date: 10/15/2019 Discharge date: 10/16/2019  Admission Diagnoses:  Principal Problem:   Right hip OA Active Problems:   Status post right hip replacement   Discharge Diagnoses:  Same  Past Medical History:  Diagnosis Date  . 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 Anemia   . Atrial fibrillation ([place])   . Bradycardia    low 20's  . Cardiac arrest ([place]) 04/20/2019   12 min CPR with epi  . Dysrhythmia   . Fall at home 03/28/2016  . Fatty liver   . GERD (gastroesophageal reflux disease)   . H/O seasonal allergies   . History of GI bleed   . Hx of adenomatous colonic polyps 08/10/2017  . Hyperlipidemia   . Hypertension   . Insomnia   . 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 MI (myocardial infarction) ([place]) 04/2019   had CPR by EMS  . OA (osteoarthritis)    hands  . OSA (obstructive sleep apnea)    No longer uses CPAP    Surgeries: Procedure(s): Right TOTAL HIP ARTHROPLASTY ANTERIOR APPROACH on 10/15/2019   Consultants:   Discharged Condition: Improved  Hospital Course: Darryl Diaz is an 79 y.o. male who was admitted 10/15/2019 for operative treatment ofOsteoarthritis of right hip. Patient has severe unremitting pain that affects sleep, daily activities, and work/hobbies. After pre-op clearance the patient was taken to the operating room on 10/15/2019 and underwent  Procedure(s): RIGHT TOTAL HIP ARTHROPLASTY ANTERIOR APPROACH.    Patient was given perioperative antibiotics:  Anti-infectives (From admission, onward)   Start     Dose/Rate Route Frequency Ordered Stop   10/16/19 0000  cephALEXin (KEFLEX) 500 MG capsule     500 mg Oral 3 times daily 10/16/19 0846 10/30/19 2359   10/15/19 1600  fluconazole (DIFLUCAN) tablet 150 mg     150 mg Oral Daily 10/15/19 0936     10/15/19 1430  ceFAZolin (ANCEF) IVPB 2g/100 mL premix     2 g 200 mL/hr over 30 Minutes  Intravenous Every 6 hours 10/15/19 1136 10/15/19 2044   10/15/19 0630  ceFAZolin (ANCEF) IVPB 2g/100 mL premix     2 g 200 mL/hr over 30 Minutes Intravenous On call to O.R. 10/15/19 8416 10/15/19 0850       Patient was given sequential compression devices, early ambulation, and chemoprophylaxis to prevent DVT.  Patient benefited maximally from hospital stay and there were no complications.    Recent vital signs:  Patient Vitals for the past 24 hrs:  BP Temp Temp src Pulse Resp SpO2  10/16/19 0449 (!) 142/60 98.5 F (36.9 C) Oral 64 16 97 %  10/16/19 0214 128/62 98.8 F (37.1 C) Oral 70 14 97 %  10/15/19 2332 139/63 98.4 F (36.9 C) -- 71 16 97 %  10/15/19 1757 (!) 141/74 97.8 F (36.6 C) -- 72 18 100 %  10/15/19 1456 (!) 147/81 99 F (37.2 C) Oral 74 16 100 %  10/15/19 1400 119/85 -- -- 69 18 100 %  10/15/19 1300 122/62 97.8 F (36.6 C) -- 66 16 100 %  10/15/19 1200 (!) 138/99 97.9 F (36.6 C) -- 72 18 100 %  10/15/19 1130 (!) 154/79 97.9 F (36.6 C) -- 70 16 100 %  10/15/19 1115 (!) 156/78 -- -- 65 18 100 %  10/15/19 1100 (!) 160/83 -- -- 73 19 100 %  10/15/19 1030  136/71 -- -- 69 15 100 %  10/15/19 1027 127/68 97.6 F (36.4 C) -- (!) 49 18 98 %     Recent laboratory studies:  Recent Labs    10/16/19 0257  WBC 11.7*  HGB 8.1*  HCT 25.1*  PLT 163  NA 136  K 4.3  CL 103  CO2 24  BUN 17  CREATININE 1.06  GLUCOSE 143*  CALCIUM 8.5*     Discharge Medications:   Allergies as of 10/16/2019      Reactions   Penicillins Other (See Comments)   Tolerated Cephalosporin Date: 10/15/19. Did it involve swelling of the face/tongue/throat, SOB, or low BP? Unknown Did it involve sudden or severe rash/hives, skin peeling, or any reaction on the inside of your mouth or nose? Unknown Did you need to seek medical attention at a hospital or doctor's office? Unknown When did it last happen?childhood reaction If all above answers are "NO", may proceed with cephalosporin  use.      Medication List    STOP taking these medications   acetaminophen 500 MG tablet Commonly known as: TYLENOL   ferrous sulfate 325 (65 FE) MG EC tablet Replaced by: ferrous sulfate 325 (65 FE) MG tablet     TAKE these medications   apixaban 5 MG Tabs tablet Commonly known as: Eliquis Take 1 tablet (5 mg total) by mouth 2 (two) times daily.   atorvastatin 20 MG tablet Commonly known as: LIPITOR Take 20 mg by mouth daily.   cephALEXin 500 MG capsule Commonly known as: Keflex Take 1 capsule (500 mg total) by mouth 3 (three) times daily for 14 days.   docusate sodium 100 MG capsule Commonly known as: Colace Take 1 capsule (100 mg total) by mouth 2 (two) times daily.   escitalopram 10 MG tablet Commonly known as: LEXAPRO Take 10 mg by mouth daily.   ferrous sulfate 325 (65 FE) MG tablet Commonly known as: FerrouSul Take 1 tablet (325 mg total) by mouth 3 (three) times daily with meals for 14 days. Replaces: ferrous sulfate 325 (65 FE) MG EC tablet   folic acid 1 MG tablet Commonly known as: FOLVITE Take 1 mg by mouth daily.   furosemide 40 MG tablet Commonly known as: LASIX Take 1 tablet (40 mg total) by mouth daily as needed for fluid (weight gain).   HYDROcodone-acetaminophen 5-325 MG tablet Commonly known as: Norco Take 1-2 tablets by mouth every 6 (six) hours as needed for moderate pain or severe pain.   loratadine 10 MG tablet Commonly known as: CLARITIN Take 10 mg by mouth daily.   methocarbamol 500 MG tablet Commonly known as: Robaxin Take 1 tablet (500 mg total) by mouth every 6 (six) hours as needed for muscle spasms.   metoprolol succinate 25 MG 24 hr tablet Commonly known as: TOPROL-XL Take 3 tablets (75 mg total) by mouth daily.   multivitamin with minerals Tabs tablet Take 1 tablet by mouth daily.   pantoprazole 40 MG tablet Commonly known as: PROTONIX Take 40 mg by mouth daily.   polyethylene glycol 17 g packet Commonly known as:  MIRALAX / GLYCOLAX Take 17 g by mouth 2 (two) times daily.   potassium chloride SA 20 MEQ tablet Commonly known as: KLOR-CON Take 2 tablets (40 mEq total) by mouth daily.   tamsulosin 0.4 MG Caps capsule Commonly known as: FLOMAX Take 0.4 mg by mouth at bedtime.   traZODone 50 MG tablet Commonly known as: DESYREL Take 50 mg by mouth at  bedtime.   vitamin B-1 250 MG tablet Take 250 mg by mouth daily.            Discharge Care Instructions  (From admission, onward)         Start     Ordered   10/16/19 0000  Change dressing    Comments: Maintain surgical dressing until follow up in the clinic. If the edges start to pull up, may reinforce with tape. If the dressing is no longer working, may remove and cover with gauze and tape, but must keep the area dry and clean.  Call with any questions or concerns.   10/16/19 0839          Diagnostic Studies: DG Pelvis Portable  Result Date: 10/15/2019 CLINICAL DATA:  Status post total hip replacement on the right EXAM: PORTABLE PELVIS 1-2 VIEWS COMPARISON:  December 23 27 FINDINGS: Frontal view of the lower pelvis and hips obtained. There is now a total hip replacement on each side with prosthetic components bilaterally well-seated. A small focus of calcification is noted lateral to the superior acetabulum, a potential small avulsion. No other findings suggesting fracture. There is degenerative change in the pubic symphysis. IMPRESSION: Prosthetic components bilaterally well-seated. Questions small avulsion arising from the superior aspect of the right acetabulum laterally. No other evidence of potential fracture. Degenerative change in pubic symphysis. Electronically Signed   By: Lowella Grip III M.D.   On: 10/15/2019 11:29   DG C-Arm 1-60 Min-No Report  Result Date: 10/15/2019 Fluoroscopy was utilized by the requesting physician.  No radiographic interpretation.   DG HIP OPERATIVE UNILAT W OR W/O PELVIS RIGHT  Result Date:  10/15/2019 CLINICAL DATA:  Right hip replacement EXAM: OPERATIVE RIGHT HIP WITH PELVIS COMPARISON:  None. FLUOROSCOPY TIME:  Radiation Exposure Index (as provided by the fluoroscopic device): 3.34 in mGy If the device does not provide the exposure index: Fluoroscopy Time:  19 seconds Number of Acquired Images:  2 FINDINGS: Acetabular component is noted in satisfactory position. Spacing device is seen. IMPRESSION: Right hip replacement Electronically Signed   By: Inez Catalina M.D.   On: 10/15/2019 11:53    Disposition: Discharge disposition: 01-Home or Self Care       Discharge Instructions    Call MD / Call 911   Complete by: As directed    If you experience chest pain or shortness of breath, CALL 911 and be transported to the hospital emergency room.  If you develope a fever above 101 F, pus (white drainage) or increased drainage or redness at the wound, or calf pain, call your surgeon's office.   Change dressing   Complete by: As directed    Maintain surgical dressing until follow up in the clinic. If the edges start to pull up, may reinforce with tape. If the dressing is no longer working, may remove and cover with gauze and tape, but must keep the area dry and clean.  Call with any questions or concerns.   Constipation Prevention   Complete by: As directed    Drink plenty of fluids.  Prune juice may be helpful.  You may use a stool softener, such as Colace (over the counter) 100 mg twice a day.  Use MiraLax (over the counter) for constipation as needed.   Diet - low sodium heart healthy   Complete by: As directed    Discharge instructions   Complete by: As directed    Maintain surgical dressing until follow up in the clinic. If the  edges start to pull up, may reinforce with tape. If the dressing is no longer working, may remove and cover with gauze and tape, but must keep the area dry and clean.  Follow up in 2 weeks at [facility]. Call with any questions or concerns.   Increase activity  slowly as tolerated   Complete by: As directed    Weight bearing as tolerated with assist device (walker, cane, etc) as directed, use it as long as suggested by your surgeon or therapist, typically at least 4-6 weeks.   TED hose   Complete by: As directed    Use stockings (TED hose) for 2 weeks on both leg(s).  You may remove them at night for sleeping.      Follow-up Information    Paralee Cancel, MD. Go on 10/30/2019.   Specialty: Orthopedic Surgery Why: You are scheduled for post op appointent on Wednesday June 23 at 3:45pm. Contact information: [address] [place] [address] [phone]            Signed: Lucille Passy [facility] 10/16/2019, 8:47 AM

## 2019-10-16 NOTE — Progress Notes (Signed)
Physical Therapy Treatment Patient Details Name: Darryl Diaz MRN: [id number] DOB: 1941-01-31 Today's Date: 10/16/2019    History of Present Illness Patient is 79 y.o. male s/p Rt THA anterior approach on 10/15/19 wiht PMH significant for OSA, MI, HTN, HLD, GERD, Fatty liver, A-fib, anemia, Lt THA in 2017.    PT Comments    Pt assisted with ambulating in hallway however distance limited by therapist due to pt's toe bleeding.  Pt also performed LE exercises.      Follow Up Recommendations  Follow surgeon's recommendation for DC plan and follow-up therapies;Home health PT     Equipment Recommendations  None recommended by PT    Recommendations for Other Services       Precautions / Restrictions Precautions Precautions: Fall Precaution Comments: pt reports hx of falls (states mostly due to esophageal bleed, had low HGB and didn't know) Restrictions Weight Bearing Restrictions: No Other Position/Activity Restrictions: WBAT    Mobility  Bed Mobility Overal bed mobility: Needs Assistance Bed Mobility: Supine to Sit     Supine to sit: Min guard     General bed mobility comments: verbal cues for technique  Transfers Overall transfer level: Needs assistance Equipment used: Rolling walker (2 wheeled) Transfers: Sit to/from Stand Sit to Stand: From elevated surface;Min guard         General transfer comment: cues for technique with RW  Ambulation/Gait Ambulation/Gait assistance: Min assist Gait Distance (Feet): 50 Feet Assistive device: Rolling walker (2 wheeled) Gait Pattern/deviations: Step-to pattern;Decreased weight shift to right;Antalgic     General Gait Details: verbal cues for sequence, RW positioning; distance limited due to pt with bleeding from left big toe so recliner brought for pt to sit down (RN notified and will apply dressing)   Stairs             Wheelchair Mobility    Modified Rankin (Stroke Patients Only)       Balance                                             Cognition Arousal/Alertness: Awake/alert Behavior During Therapy: WFL for tasks assessed/performed Overall Cognitive Status: Within Functional Limits for tasks assessed                                        Exercises Total Joint Exercises Ankle Circles/Pumps: AROM;Both;10 reps Quad Sets: AROM;Right;10 reps Short Arc Quad: AROM;Right;10 reps Heel Slides: Right;10 reps;AAROM Hip ABduction/ADduction: AAROM;Right;10 reps    General Comments        Pertinent Vitals/Pain Pain Assessment: 0-10 Pain Score: 3  Pain Location: Rt hip Pain Descriptors / Indicators: Aching;Discomfort Pain Intervention(s): Repositioned;Monitored during session;Ice applied    Home Living                      Prior Function            PT Goals (current goals can now be found in the care plan section) Progress towards PT goals: Progressing toward goals    Frequency    7X/week      PT Plan Current plan remains appropriate    Co-evaluation              AM-PAC PT "6 Clicks" Mobility   Outcome Measure  Help needed turning from your back to your side while in a flat bed without using bedrails?: A Little Help needed moving from lying on your back to sitting on the side of a flat bed without using bedrails?: A Little Help needed moving to and from a bed to a chair (including a wheelchair)?: A Little Help needed standing up from a chair using your arms (e.g., wheelchair or bedside chair)?: A Little Help needed to walk in hospital room?: A Little Help needed climbing 3-5 steps with a railing? : A Little 6 Click Score: 18    End of Session Equipment Utilized During Treatment: Gait belt Activity Tolerance: Patient tolerated treatment well Patient left: in chair;with call bell/phone within reach;with chair alarm set Nurse Communication: Mobility status PT Visit Diagnosis: Muscle weakness (generalized)  (M62.81);Difficulty in walking, not elsewhere classified (R26.2)     Time: 2446-9507 PT Time Calculation (min) (ACUTE ONLY): 17 min  Charges:  $Therapeutic Exercise: 8-22 mins                    Arlyce Dice, DPT Acute Rehabilitation Services Pager: [phone] Office: [phone]  Trena Platt 10/16/2019, 12:35 PM

## 2019-10-21 DIAGNOSIS — H2513 Age-related nuclear cataract, bilateral: Secondary | ICD-10-CM | POA: Diagnosis not present

## 2019-10-25 ENCOUNTER — Other Ambulatory Visit: Payer: Self-pay

## 2019-10-25 ENCOUNTER — Encounter: Payer: Self-pay | Admitting: Cardiovascular Disease

## 2019-10-25 ENCOUNTER — Ambulatory Visit (INDEPENDENT_AMBULATORY_CARE_PROVIDER_SITE_OTHER): Payer: Medicare Other | Admitting: Cardiovascular Disease

## 2019-10-25 VITALS — BP 128/70 | HR 70 | Ht 72.0 in | Wt 236.4 lb

## 2019-10-25 DIAGNOSIS — R001 Bradycardia, unspecified: Secondary | ICD-10-CM

## 2019-10-25 DIAGNOSIS — I480 Paroxysmal atrial fibrillation: Secondary | ICD-10-CM | POA: Diagnosis not present

## 2019-10-25 NOTE — Progress Notes (Signed)
Cardiology Office Note:    Date:  10/25/2019   ID:  Darryl Diaz, DOB 02-25-1941, MRN [id number]  PCP:  [name], MD  [facility] Cardiologist:  Sherren Mocha, MD  [facility] Electrophysiologist:  None   Referring MD: [name], MD   Chief Complaint  Patient presents with  . Atrial Fibrillation    History of Present Illness:    Darryl Diaz is a 79 y.o. male with a hx of paroxysmal atrial fibrillation, presenting for follow-up evaluation. 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 Anticoagulation was temporarily held and he has resumed apixaban without recurrent bleeding. He presented in December 2020 with a bradycardic arrest in the settin gof metoprolol, cardizem and amiodarone, as well as a lot of grapefruit juice. Medications were held and he has had no recurrent problems.   The patient is here with his son today.  He had right hip replacement by Dr. Alvan Dame about a week ago.  He is recovering well and he is ambulating with a walker today.  States that his chronic leg edema is unchanged.  He has had no heart palpitations.  He denies chest pain or pressure.  He admits to mild shortness of breath with exertion unchanged over time.  No other complaints today.  He has quit drinking grapefruit juice.  Past Medical History:  Diagnosis Date  . 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 Anemia   . Atrial fibrillation ([place])   . Bradycardia    low 20's  . Cardiac arrest ([place]) 04/20/2019   12 min CPR with epi  . Dysrhythmia   . Fall at home 03/28/2016  . Fatty liver   . GERD (gastroesophageal reflux disease)   . H/O seasonal allergies   . History of GI bleed   . Hx of adenomatous colonic polyps 08/10/2017  . Hyperlipidemia   . Hypertension   . Insomnia   . 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 MI (myocardial infarction) ([place]) 04/2019   had CPR by EMS  . OA (osteoarthritis)     hands  . OSA (obstructive sleep apnea)    No longer uses CPAP    Past Surgical History:  Procedure Laterality Date  . ANKLE SURGERY Left 2014   had rods put in   . BIOPSY  02/28/2019   Procedure: BIOPSY;  Surgeon: Milus Banister, MD;  Location: [facility] ENDOSCOPY;  Service: Endoscopy;;  . [place]  . ESOPHAGOGASTRODUODENOSCOPY (EGD) WITH PROPOFOL N/A 02/28/2019   Procedure: ESOPHAGOGASTRODUODENOSCOPY (EGD) WITH PROPOFOL;  Surgeon: Milus Banister, MD;  Location: [facility] ENDOSCOPY;  Service: Endoscopy;  Laterality: N/A;  . HIP ARTHROPLASTY Left 04/01/2016   Procedure: ARTHROPLASTY BIPOLAR HIP (HEMIARTHROPLASTY);  Surgeon: Paralee Cancel, MD;  Location: [place];  Service: Orthopedics;  Laterality: Left;  . HIP CLOSED REDUCTION Left 04/30/2016   Procedure: CLOSED REDUCTION HIP;  Surgeon: Wylene Simmer, MD;  Location: [place];  Service: Orthopedics;  Laterality: Left;  . TONSILLECTOMY    . 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 apixaban (ELIQUIS) 5 MG TABS tablet Take 1 tablet (5 mg total) by mouth 2 (two) times daily.  Marland Kitchen  atorvastatin (LIPITOR) 20 MG tablet Take 20 mg by mouth daily.  . cephALEXin (KEFLEX) 500 MG capsule Take 1 capsule (500 mg total) by mouth 3 (three) times daily for 14 days.  Marland Kitchen docusate sodium (COLACE) 100 MG capsule Take 1 capsule (100 mg total) by mouth 2 (two) times daily.  Marland Kitchen escitalopram (LEXAPRO) 10 MG tablet Take 10 mg by mouth daily.  . ferrous sulfate (FERROUSUL) 325 (65 FE) MG tablet Take 1 tablet (325 mg total) by mouth 3 (three) times daily with meals for 14 days.  . folic acid (FOLVITE) 1 MG tablet Take 1 mg by mouth daily.  . furosemide (LASIX) 40 MG tablet Take 1 tablet (40 mg total) by mouth daily as needed for fluid (weight gain).  Marland Kitchen  HYDROcodone-acetaminophen (NORCO) 5-325 MG tablet Take 1-2 tablets by mouth every 6 (six) hours as needed for moderate pain or severe pain.  Marland Kitchen loratadine (CLARITIN) 10 MG tablet Take 10 mg by mouth daily.  . methocarbamol (ROBAXIN) 500 MG tablet Take 1 tablet (500 mg total) by mouth every 6 (six) hours as needed for muscle spasms.  . metoprolol succinate (TOPROL-XL) 25 MG 24 hr tablet Take 3 tablets (75 mg total) by mouth daily.  . Multiple Vitamin (MULTIVITAMIN WITH MINERALS) TABS tablet Take 1 tablet by mouth daily.  . pantoprazole (PROTONIX) 40 MG tablet Take 40 mg by mouth daily.  . polyethylene glycol (MIRALAX / GLYCOLAX) 17 g packet Take 17 g by mouth 2 (two) times daily.  . potassium chloride SA (KLOR-CON) 20 MEQ tablet Take 2 tablets (40 mEq total) by mouth daily.  . tamsulosin (FLOMAX) 0.4 MG CAPS capsule Take 0.4 mg by mouth at bedtime.   . Thiamine HCl (VITAMIN B-1) 250 MG tablet Take 250 mg by mouth daily.  . traZODone (DESYREL) 50 MG tablet Take 50 mg by mouth at bedtime.     Allergies:   Penicillins   Social History   Socioeconomic History  . Marital status: Widowed    Spouse name: Not on file  . Number of children: 2  . Years of education: college  . Highest education level: Not on file  Occupational History  . Occupation: retired  Tobacco Use  . Smoking status: Former Smoker    Types: Cigarettes  . Smokeless tobacco: Never Used  . Tobacco comment:    quit 20 years  smoked on and off for 20 years  Vaping Use  . Vaping Use: Never used  Substance and Sexual Activity  . Alcohol use: No    Comment: 03/2016   i HAD ONE DRINK & THAT WAS THE FIRST DRINK i HAD IN A LONG TIME   . Drug use: No  . Sexual activity: Not Currently  Other Topics Concern  . Not on file  Social History Narrative   The patient is divorced w/ 1 son 1 daughter   He is a retired Regulatory affairs officer, he lived in [place] but [employer] including windows on the world in the [place]  room in [place]   He moved to [place] to be near his sister who lives in [place] alcoholic member of AA   2 caffeinated beverages daily prior smoker no drugs   Social Determinants of Radio broadcast assistant Strain:   . Difficulty of Paying Living Expenses:   Food Insecurity:   . Worried About Charity fundraiser in the Last Year:   . [place] in the Last  Year:   Transportation Needs:   . Film/video editor (Medical):   Marland Kitchen Lack of Transportation (Non-Medical):   Physical Activity:   . Days of Exercise per Week:   . Minutes of Exercise per Session:   Stress:   . Feeling of Stress :   Social Connections:   . Frequency of Communication with Friends and Family:   . Frequency of Social Gatherings with Friends and Family:   . Attends Religious Services:   . Active Member of Clubs or Organizations:   . Attends Archivist Meetings:   Marland Kitchen Marital Status:      Family History: The patient's family history includes Arthritis in his mother; Heart disease (age of onset: 33) in his mother; Heart failure in his sister; Heart failure (age of onset: 86) in his father; Hypertension in his mother; Stroke in his maternal aunt. There is no history of Colon cancer, Esophageal cancer, Stomach cancer, or Rectal cancer.  ROS:   Please see the history of present illness.    All other systems reviewed and are negative.  EKGs/Labs/Other Studies Reviewed:    The following studies were reviewed today: Echo 04/21/2019: IMPRESSIONS    1. The left ventricle has no regional wall motion abnormalities.  2. LVEF is 60-65%.  3. Left atrial size was severely dilated.  4. The mitral valve was not assessed. not assessed mitral valve  regurgitation. No evidence of mitral stenosis.  5. The tricuspid valve is normal in structure. Tricuspid valve  regurgitation is trivial.  6. The pulmonic valve was not well visualized. Pulmonic valve  regurgitation not  assessed.  7. Limited echo s/p cardiac arrest  8. Not assessed.  9. The aortic valve was not assessed. Aortic valve regurgitation not  assessed.  10. Left ventricular ejection fraction, by visual estimation, is 60 to  65%. The left ventricle has normal function. There is not assseded.  11. Global right ventricle has normal systolic function.The right  ventricular size is not assessed. Right vetricular wall thickness was not  assessed.   EKG:  EKG is not ordered today.   Recent Labs: 02/25/2019: B Natriuretic Peptide 209.0 03/05/2019: ALT 50 04/22/2019: Magnesium 1.5; TSH 3.727 10/16/2019: BUN 17; Creatinine, Ser 1.06; Hemoglobin 8.1; Platelets 163; Potassium 4.3; Sodium 136  Recent Lipid Panel    Component Value Date/Time   CHOL 81 02/28/2019 0314   TRIG 27 02/28/2019 0314   HDL 47 02/28/2019 0314   CHOLHDL 1.7 02/28/2019 0314   VLDL 5 02/28/2019 0314   LDLCALC 29 02/28/2019 0314    Physical Exam:    VS:  BP 128/70 (BP Location: Right Arm, Patient Position: Sitting, Cuff Size: Normal)   Pulse 70   Ht 6' (1.829 m)   Wt 236 lb 6.4 oz (107.2 kg)   SpO2 99%   BMI 32.06 kg/m     Wt Readings from Last 3 Encounters:  10/25/19 236 lb 6.4 oz (107.2 kg)  10/15/19 231 lb 6 oz (105 kg)  10/03/19 231 lb 6 oz (105 kg)     GEN:  Well nourished, well developed elderly male in no acute distress HEENT: Normal NECK: No JVD; No carotid bruits LYMPHATICS: No lymphadenopathy CARDIAC: R, no murmurs, rubs, gallops RESPIRATORY:  Clear to auscultation without rales, wheezing or rhonchi  ABDOMEN: Soft, non-tender, non-distended MUSCULOSKELETAL: 1+ bilateral pretibial edema; No deformity  SKIN: Warm and dry NEUROLOGIC:  Alert and oriented x 3 PSYCHIATRIC:  Normal affect   ASSESSMENT:    1. PAF (paroxysmal  atrial fibrillation) ([place])   2. Bradycardia    PLAN:    In order of problems listed above:  1. The patient appears to be doing well.  He continues on apixaban with no  recurrent bleeding problems.  He continues on metoprolol succinate and remains off of diltiazem and amiodarone with no recurrence of bradycardic events.  We will follow up in 1 year.  He will have an APP visit in 6 months. 2. No recurrence with reduction in his AV nodal blocking agents Dr. Olin Pia office evaluation reviewed.   Medication Adjustments/Labs and Tests Ordered: Current medicines are reviewed at length with the patient today.  Concerns regarding medicines are outlined above.  No orders of the defined types were placed in this encounter.  No orders of the defined types were placed in this encounter.   Patient Instructions  Medication Instructions:  Your provider recommends that you continue on your current medications as directed. Please refer to the Current Medication list given to you today.   *If you need a refill on your cardiac medications before your next appointment, please call your pharmacy*  Follow-Up: At [facility], you and your health needs are our priority.  As part of our continuing mission to provide you with exceptional heart care, we have created designated Provider Care Teams.  These Care Teams include your primary Cardiologist (physician) and Advanced Practice Providers (APPs -  Physician Assistants and Nurse Practitioners) who all work together to provide you with the care you need, when you need it. Your next appointment:   6 month(s) The format for your next appointment:   In Person Provider:   Kathyrn Drown    Signed, Sherren Mocha, MD  10/25/2019 3:00 PM    [place]

## 2019-10-25 NOTE — Patient Instructions (Signed)
Medication Instructions:  Your provider recommends that you continue on your current medications as directed. Please refer to the Current Medication list given to you today.   *If you need a refill on your cardiac medications before your next appointment, please call your pharmacy*  Follow-Up: At [facility], you and your health needs are our priority.  As part of our continuing mission to provide you with exceptional heart care, we have created designated Provider Care Teams.  These Care Teams include your primary Cardiologist (physician) and Advanced Practice Providers (APPs -  Physician Assistants and Nurse Practitioners) who all work together to provide you with the care you need, when you need it. Your next appointment:   6 month(s) The format for your next appointment:   In Person Provider:   Kathyrn Drown

## 2019-11-20 ENCOUNTER — Telehealth: Payer: Self-pay

## 2019-11-20 ENCOUNTER — Telehealth: Payer: Self-pay | Admitting: Cardiovascular Disease

## 2019-11-20 DIAGNOSIS — H2513 Age-related nuclear cataract, bilateral: Secondary | ICD-10-CM | POA: Diagnosis not present

## 2019-11-20 DIAGNOSIS — H40023 Open angle with borderline findings, high risk, bilateral: Secondary | ICD-10-CM | POA: Diagnosis not present

## 2019-11-20 DIAGNOSIS — H35432 Paving stone degeneration of retina, left eye: Secondary | ICD-10-CM | POA: Diagnosis not present

## 2019-11-20 DIAGNOSIS — H33031 Retinal detachment with giant retinal tear, right eye: Secondary | ICD-10-CM | POA: Diagnosis not present

## 2019-11-20 DIAGNOSIS — H35362 Drusen (degenerative) of macula, left eye: Secondary | ICD-10-CM | POA: Diagnosis not present

## 2019-11-20 DIAGNOSIS — H43813 Vitreous degeneration, bilateral: Secondary | ICD-10-CM | POA: Diagnosis not present

## 2019-11-20 NOTE — Telephone Encounter (Signed)
Patient with diagnosis of afib on Eliquis for anticoagulation.    Procedure: Viprectomy Laser and gas fluid exchange of right eye Date of procedure: 11/21/19  CHADS2-VASc score of 4 (age x2, HTN, CAD)  CrCl 45mL/min using adjusted body weight Platelet count 163K  Per office protocol, patient can hold Eliquis for 1 - 2 days prior to procedure.

## 2019-11-20 NOTE — Telephone Encounter (Signed)
Error

## 2019-11-20 NOTE — Telephone Encounter (Signed)
      [facility] Pre-operative Risk Assessment    HEARTCARE STAFF: - Please ensure there is not already an duplicate clearance open for this procedure. - Under Visit Info/Reason for Call, type in Other and utilize the format Clearance MM/DD/YY or Clearance TBD. Do not use dashes or single digits. - If request is for dental extraction, please clarify the # of teeth to be extracted.  Request for surgical clearance:  1. What type of surgery is being performed? Viprectomy Laser and gas fluid exchange of right eye  2. When is this surgery scheduled? 11/21/19  3. What type of clearance is required (medical clearance vs. Pharmacy clearance to hold med vs. Both)? both  4. Are there any medications that need to be held prior to surgery and how long? Eliquis  5. Practice name and name of physician performing surgery? Dr. Sherlynn Stalls, [place]  6. What is the office phone number? [phone]   7.   What is the office fax number? [phone]  8.   Anesthesia type (None, local, MAC, general) ? MAC   Darryl Diaz 11/20/2019, 9:55 AM  _________________________________________________________________   (provider comments below)

## 2019-11-20 NOTE — Telephone Encounter (Signed)
   Primary Cardiologist: Sherren Mocha, MD  Chart reviewed as part of pre-operative protocol coverage. Given past medical history and time since last visit, based on ACC/AHA guidelines, Darryl Diaz would be at acceptable risk for the planned procedure without further cardiovascular testing.   OK to hold Eliquis 1-2 days pre op if needed-resume as soon as safe post op.  I will route this recommendation to the requesting party via Epic fax function and remove from pre-op pool.  Please call with questions.  Kerin Ransom, PA-C 11/20/2019, 1:55 PM

## 2019-11-21 DIAGNOSIS — H2511 Age-related nuclear cataract, right eye: Secondary | ICD-10-CM | POA: Diagnosis not present

## 2019-11-21 DIAGNOSIS — H33021 Retinal detachment with multiple breaks, right eye: Secondary | ICD-10-CM | POA: Diagnosis not present

## 2019-11-22 DIAGNOSIS — H33031 Retinal detachment with giant retinal tear, right eye: Secondary | ICD-10-CM | POA: Diagnosis not present

## 2019-11-29 DIAGNOSIS — H33031 Retinal detachment with giant retinal tear, right eye: Secondary | ICD-10-CM | POA: Diagnosis not present

## 2019-12-11 DIAGNOSIS — Z96641 Presence of right artificial hip joint: Secondary | ICD-10-CM | POA: Diagnosis not present

## 2019-12-11 DIAGNOSIS — Z471 Aftercare following joint replacement surgery: Secondary | ICD-10-CM | POA: Diagnosis not present

## 2019-12-20 DIAGNOSIS — H33031 Retinal detachment with giant retinal tear, right eye: Secondary | ICD-10-CM | POA: Diagnosis not present

## 2019-12-28 ENCOUNTER — Other Ambulatory Visit: Payer: Self-pay | Admitting: Physician Assistant

## 2020-01-06 DIAGNOSIS — I5032 Chronic diastolic (congestive) heart failure: Secondary | ICD-10-CM | POA: Diagnosis not present

## 2020-01-06 DIAGNOSIS — I10 Essential (primary) hypertension: Secondary | ICD-10-CM | POA: Diagnosis not present

## 2020-01-06 DIAGNOSIS — Z7901 Long term (current) use of anticoagulants: Secondary | ICD-10-CM | POA: Diagnosis not present

## 2020-01-06 DIAGNOSIS — I480 Paroxysmal atrial fibrillation: Secondary | ICD-10-CM | POA: Diagnosis not present

## 2020-01-06 DIAGNOSIS — N401 Enlarged prostate with lower urinary tract symptoms: Secondary | ICD-10-CM | POA: Diagnosis not present

## 2020-01-06 DIAGNOSIS — R7301 Impaired fasting glucose: Secondary | ICD-10-CM | POA: Diagnosis not present

## 2020-01-06 DIAGNOSIS — E7800 Pure hypercholesterolemia, unspecified: Secondary | ICD-10-CM | POA: Diagnosis not present

## 2020-01-06 DIAGNOSIS — K219 Gastro-esophageal reflux disease without esophagitis: Secondary | ICD-10-CM | POA: Diagnosis not present

## 2020-01-22 DIAGNOSIS — H33031 Retinal detachment with giant retinal tear, right eye: Secondary | ICD-10-CM | POA: Diagnosis not present

## 2020-02-05 ENCOUNTER — Other Ambulatory Visit: Payer: Self-pay

## 2020-02-05 ENCOUNTER — Inpatient Hospital Stay (HOSPITAL_COMMUNITY)
Admission: EM | Admit: 2020-02-05 | Discharge: 2020-02-07 | DRG: 309 | Disposition: A | Discharge status: Skilled Nursing Facility | Payer: Medicare Other | Attending: Cardiovascular Disease | Admitting: Cardiovascular Disease

## 2020-02-05 ENCOUNTER — Encounter (HOSPITAL_COMMUNITY): Payer: Self-pay

## 2020-02-05 ENCOUNTER — Emergency Department (HOSPITAL_COMMUNITY): Payer: Medicare Other

## 2020-02-05 DIAGNOSIS — F329 Major depressive disorder, single episode, unspecified: Secondary | ICD-10-CM | POA: Diagnosis present

## 2020-02-05 DIAGNOSIS — W19XXXA Unspecified fall, initial encounter: Secondary | ICD-10-CM | POA: Diagnosis not present

## 2020-02-05 DIAGNOSIS — Z79899 Other long term (current) drug therapy: Secondary | ICD-10-CM

## 2020-02-05 DIAGNOSIS — I10 Essential (primary) hypertension: Secondary | ICD-10-CM | POA: Diagnosis present

## 2020-02-05 DIAGNOSIS — M436 Torticollis: Secondary | ICD-10-CM | POA: Diagnosis present

## 2020-02-05 DIAGNOSIS — K219 Gastro-esophageal reflux disease without esophagitis: Secondary | ICD-10-CM | POA: Diagnosis present

## 2020-02-05 DIAGNOSIS — R42 Dizziness and giddiness: Secondary | ICD-10-CM | POA: Diagnosis not present

## 2020-02-05 DIAGNOSIS — I480 Paroxysmal atrial fibrillation: Secondary | ICD-10-CM | POA: Diagnosis present

## 2020-02-05 DIAGNOSIS — Z20822 Contact with and (suspected) exposure to covid-19: Secondary | ICD-10-CM | POA: Diagnosis not present

## 2020-02-05 DIAGNOSIS — Z87891 Personal history of nicotine dependence: Secondary | ICD-10-CM

## 2020-02-05 DIAGNOSIS — G319 Degenerative disease of nervous system, unspecified: Secondary | ICD-10-CM | POA: Diagnosis not present

## 2020-02-05 DIAGNOSIS — Z8261 Family history of arthritis: Secondary | ICD-10-CM

## 2020-02-05 DIAGNOSIS — Z23 Encounter for immunization: Secondary | ICD-10-CM

## 2020-02-05 DIAGNOSIS — I4891 Unspecified atrial fibrillation: Secondary | ICD-10-CM | POA: Diagnosis not present

## 2020-02-05 DIAGNOSIS — Z86010 Personal history of colonic polyps: Secondary | ICD-10-CM

## 2020-02-05 DIAGNOSIS — Z8674 Personal history of sudden cardiac arrest: Secondary | ICD-10-CM

## 2020-02-05 DIAGNOSIS — R001 Bradycardia, unspecified: Secondary | ICD-10-CM | POA: Diagnosis present

## 2020-02-05 DIAGNOSIS — S199XXA Unspecified injury of neck, initial encounter: Secondary | ICD-10-CM | POA: Diagnosis not present

## 2020-02-05 DIAGNOSIS — G4733 Obstructive sleep apnea (adult) (pediatric): Secondary | ICD-10-CM | POA: Diagnosis present

## 2020-02-05 DIAGNOSIS — E871 Hypo-osmolality and hyponatremia: Secondary | ICD-10-CM | POA: Diagnosis present

## 2020-02-05 DIAGNOSIS — Z96643 Presence of artificial hip joint, bilateral: Secondary | ICD-10-CM | POA: Diagnosis present

## 2020-02-05 DIAGNOSIS — Z823 Family history of stroke: Secondary | ICD-10-CM

## 2020-02-05 DIAGNOSIS — Z8249 Family history of ischemic heart disease and other diseases of the circulatory system: Secondary | ICD-10-CM

## 2020-02-05 DIAGNOSIS — S0990XA Unspecified injury of head, initial encounter: Secondary | ICD-10-CM | POA: Diagnosis not present

## 2020-02-05 DIAGNOSIS — Z8719 Personal history of other diseases of the digestive system: Secondary | ICD-10-CM

## 2020-02-05 DIAGNOSIS — I4819 Other persistent atrial fibrillation: Principal | ICD-10-CM

## 2020-02-05 DIAGNOSIS — R531 Weakness: Secondary | ICD-10-CM

## 2020-02-05 DIAGNOSIS — I6782 Cerebral ischemia: Secondary | ICD-10-CM | POA: Diagnosis not present

## 2020-02-05 DIAGNOSIS — G4700 Insomnia, unspecified: Secondary | ICD-10-CM | POA: Diagnosis present

## 2020-02-05 DIAGNOSIS — Y92018 Other place in single-family (private) house as the place of occurrence of the external cause: Secondary | ICD-10-CM

## 2020-02-05 DIAGNOSIS — E785 Hyperlipidemia, unspecified: Secondary | ICD-10-CM | POA: Diagnosis not present

## 2020-02-05 DIAGNOSIS — R52 Pain, unspecified: Secondary | ICD-10-CM | POA: Diagnosis not present

## 2020-02-05 DIAGNOSIS — S80211A Abrasion, right knee, initial encounter: Secondary | ICD-10-CM | POA: Diagnosis present

## 2020-02-05 DIAGNOSIS — E876 Hypokalemia: Secondary | ICD-10-CM | POA: Diagnosis present

## 2020-02-05 DIAGNOSIS — Z7901 Long term (current) use of anticoagulants: Secondary | ICD-10-CM

## 2020-02-05 DIAGNOSIS — I252 Old myocardial infarction: Secondary | ICD-10-CM

## 2020-02-05 DIAGNOSIS — Y92009 Unspecified place in unspecified non-institutional (private) residence as the place of occurrence of the external cause: Secondary | ICD-10-CM

## 2020-02-05 DIAGNOSIS — R0902 Hypoxemia: Secondary | ICD-10-CM | POA: Diagnosis not present

## 2020-02-05 DIAGNOSIS — M4312 Spondylolisthesis, cervical region: Secondary | ICD-10-CM | POA: Diagnosis not present

## 2020-02-05 DIAGNOSIS — M47812 Spondylosis without myelopathy or radiculopathy, cervical region: Secondary | ICD-10-CM | POA: Diagnosis not present

## 2020-02-05 DIAGNOSIS — D649 Anemia, unspecified: Secondary | ICD-10-CM | POA: Diagnosis present

## 2020-02-05 DIAGNOSIS — E860 Dehydration: Secondary | ICD-10-CM | POA: Diagnosis present

## 2020-02-05 DIAGNOSIS — I959 Hypotension, unspecified: Secondary | ICD-10-CM | POA: Diagnosis not present

## 2020-02-05 DIAGNOSIS — Z880 Allergy status to penicillin: Secondary | ICD-10-CM

## 2020-02-05 DIAGNOSIS — Y9301 Activity, walking, marching and hiking: Secondary | ICD-10-CM | POA: Diagnosis present

## 2020-02-05 DIAGNOSIS — W01198A Fall on same level from slipping, tripping and stumbling with subsequent striking against other object, initial encounter: Secondary | ICD-10-CM | POA: Diagnosis present

## 2020-02-05 HISTORY — DX: Other persistent atrial fibrillation: I48.19

## 2020-02-05 LAB — RESPIRATORY PANEL BY RT PCR (FLU A&B, COVID)
Influenza A by PCR: NEGATIVE
Influenza B by PCR: NEGATIVE
SARS Coronavirus 2 by RT PCR: NEGATIVE

## 2020-02-05 LAB — BASIC METABOLIC PANEL
Anion gap: 18 — ABNORMAL HIGH (ref 5–15)
Anion gap: 14 (ref 5–15)
BUN: 17 mg/dL (ref 8–23)
BUN: 16 mg/dL (ref 8–23)
CO2: 18 mmol/L — ABNORMAL LOW (ref 22–32)
CO2: 21 mmol/L — ABNORMAL LOW (ref 22–32)
Calcium: 8.7 mg/dL — ABNORMAL LOW (ref 8.9–10.3)
Calcium: 8.6 mg/dL — ABNORMAL LOW (ref 8.9–10.3)
Chloride: 92 mmol/L — ABNORMAL LOW (ref 98–111)
Chloride: 95 mmol/L — ABNORMAL LOW (ref 98–111)
Creatinine, Ser: 1.32 mg/dL — ABNORMAL HIGH (ref 0.61–1.24)
Creatinine, Ser: 1.31 mg/dL — ABNORMAL HIGH (ref 0.61–1.24)
GFR calc Af Amer: 59 mL/min — ABNORMAL LOW (ref >60)
GFR calc Af Amer: >60 mL/min (ref >60)
GFR calc non Af Amer: 51 mL/min — ABNORMAL LOW (ref >60)
GFR calc non Af Amer: 52 mL/min — ABNORMAL LOW (ref >60)
Glucose, Bld: 105 mg/dL — ABNORMAL HIGH (ref 70–99)
Glucose, Bld: 155 mg/dL — ABNORMAL HIGH (ref 70–99)
Potassium: 4 mmol/L (ref 3.5–5.1)
Potassium: 2.7 mmol/L — CL (ref 3.5–5.1)
Sodium: 128 mmol/L — ABNORMAL LOW (ref 135–145)
Sodium: 130 mmol/L — ABNORMAL LOW (ref 135–145)

## 2020-02-05 LAB — CBC WITH DIFFERENTIAL/PLATELET
Abs Immature Granulocytes: 0.03 10*3/uL (ref 0.00–0.07)
Basophils Absolute: 0 10*3/uL (ref 0.0–0.1)
Basophils Relative: 1 %
Eosinophils Absolute: 0.1 10*3/uL (ref 0.0–0.5)
Eosinophils Relative: 1 %
HCT: 29.8 % — ABNORMAL LOW (ref 39.0–52.0)
Hemoglobin: 10 g/dL — ABNORMAL LOW (ref 13.0–17.0)
Immature Granulocytes: 0 %
Lymphocytes Relative: 9 %
Lymphs Abs: 0.7 10*3/uL (ref 0.7–4.0)
MCH: 31.6 pg (ref 26.0–34.0)
MCHC: 33.6 g/dL (ref 30.0–36.0)
MCV: 94.3 fL (ref 80.0–100.0)
Monocytes Absolute: 0.6 10*3/uL (ref 0.1–1.0)
Monocytes Relative: 8 %
Neutro Abs: 5.8 10*3/uL (ref 1.7–7.7)
Neutrophils Relative %: 81 %
Platelets: 116 10*3/uL — ABNORMAL LOW (ref 150–400)
RBC: 3.16 MIL/uL — ABNORMAL LOW (ref 4.22–5.81)
RDW: 15 % (ref 11.5–15.5)
WBC: 7.1 10*3/uL (ref 4.0–10.5)
nRBC: 0 % (ref 0.0–0.2)

## 2020-02-05 LAB — MAGNESIUM: Magnesium: 0.9 mg/dL — CL (ref 1.7–2.4)

## 2020-02-05 LAB — POTASSIUM: Potassium: 2.7 mmol/L — CL (ref 3.5–5.1)

## 2020-02-05 MED ORDER — LORATADINE 10 MG PO TABS
10.0000 mg | ORAL_TABLET | Freq: Every day | ORAL | Status: DC
  Administered 2020-02-05 – 2020-02-07 (×3): 10 mg via ORAL
  Filled 2020-02-05 (×3): qty 1

## 2020-02-05 MED ORDER — METOPROLOL TARTRATE 5 MG/5ML IV SOLN: 2.5000 mg | INTRAVENOUS | Status: DC | PRN

## 2020-02-05 MED ORDER — ESCITALOPRAM OXALATE 10 MG PO TABS
10.0000 mg | ORAL_TABLET | Freq: Every day | ORAL | Status: DC
  Administered 2020-02-05 – 2020-02-07 (×3): 10 mg via ORAL
  Filled 2020-02-05 (×3): qty 1

## 2020-02-05 MED ORDER — SODIUM CHLORIDE 0.9 % IV SOLN
250.0000 mL | INTRAVENOUS | Status: DC | PRN
  Administered 2020-02-07: 250 mL via INTRAVENOUS

## 2020-02-05 MED ORDER — ALPRAZOLAM 0.25 MG PO TABS
0.2500 mg | ORAL_TABLET | Freq: Two times a day (BID) | ORAL | Status: DC | PRN
  Administered 2020-02-06: 0.25 mg via ORAL
  Filled 2020-02-05: qty 1

## 2020-02-05 MED ORDER — POTASSIUM CHLORIDE CRYS ER 20 MEQ PO TBCR
40.0000 meq | EXTENDED_RELEASE_TABLET | Freq: Every day | ORAL | Status: DC
  Administered 2020-02-05: 40 meq via ORAL
  Filled 2020-02-05: qty 2

## 2020-02-05 MED ORDER — POLYETHYLENE GLYCOL 3350 17 G PO PACK
17.0000 g | PACK | Freq: Two times a day (BID) | ORAL | Status: DC
  Administered 2020-02-05 – 2020-02-07 (×2): 17 g via ORAL
  Filled 2020-02-05 (×2): qty 1

## 2020-02-05 MED ORDER — SODIUM CHLORIDE 0.9% FLUSH: 3.0000 mL | INTRAVENOUS | Status: DC | PRN

## 2020-02-05 MED ORDER — TRAZODONE HCL 50 MG PO TABS
50.0000 mg | ORAL_TABLET | Freq: Every day | ORAL | Status: DC
  Administered 2020-02-05 – 2020-02-06 (×2): 50 mg via ORAL
  Filled 2020-02-05 (×2): qty 1

## 2020-02-05 MED ORDER — MAGNESIUM OXIDE 400 (241.3 MG) MG PO TABS
800.0000 mg | ORAL_TABLET | ORAL | Status: AC
  Administered 2020-02-06 (×2): 800 mg via ORAL
  Filled 2020-02-05 (×2): qty 2

## 2020-02-05 MED ORDER — POTASSIUM CHLORIDE CRYS ER 20 MEQ PO TBCR
40.0000 meq | EXTENDED_RELEASE_TABLET | Freq: Once | ORAL | Status: AC
  Administered 2020-02-06: 40 meq via ORAL
  Filled 2020-02-05: qty 2

## 2020-02-05 MED ORDER — ZOLPIDEM TARTRATE 5 MG PO TABS: 5.0000 mg | ORAL_TABLET | Freq: Every evening | ORAL | Status: DC | PRN

## 2020-02-05 MED ORDER — TAMSULOSIN HCL 0.4 MG PO CAPS
0.4000 mg | ORAL_CAPSULE | Freq: Every day | ORAL | Status: DC
  Administered 2020-02-05 – 2020-02-06 (×2): 0.4 mg via ORAL
  Filled 2020-02-05 (×2): qty 1

## 2020-02-05 MED ORDER — FERROUS SULFATE 325 (65 FE) MG PO TABS
325.0000 mg | ORAL_TABLET | Freq: Three times a day (TID) | ORAL | Status: DC
  Administered 2020-02-06 – 2020-02-07 (×5): 325 mg via ORAL
  Filled 2020-02-05 (×5): qty 1

## 2020-02-05 MED ORDER — HYDROCODONE-ACETAMINOPHEN 5-325 MG PO TABS: 1.0000 | ORAL_TABLET | Freq: Four times a day (QID) | ORAL | Status: DC | PRN

## 2020-02-05 MED ORDER — METOPROLOL TARTRATE 5 MG/5ML IV SOLN
10.0000 mg | Freq: Once | INTRAVENOUS | Status: AC
  Administered 2020-02-05: 10 mg via INTRAVENOUS
  Filled 2020-02-05: qty 10

## 2020-02-05 MED ORDER — ACETAMINOPHEN 325 MG PO TABS: 650.0000 mg | ORAL_TABLET | ORAL | Status: DC | PRN

## 2020-02-05 MED ORDER — METHOCARBAMOL 500 MG PO TABS: 500.0000 mg | ORAL_TABLET | Freq: Four times a day (QID) | ORAL | Status: DC | PRN

## 2020-02-05 MED ORDER — SODIUM CHLORIDE 0.9% FLUSH
3.0000 mL | Freq: Two times a day (BID) | INTRAVENOUS | Status: DC
  Administered 2020-02-06 – 2020-02-07 (×2): 3 mL via INTRAVENOUS

## 2020-02-05 MED ORDER — ATORVASTATIN CALCIUM 10 MG PO TABS
20.0000 mg | ORAL_TABLET | Freq: Every day | ORAL | Status: DC
  Administered 2020-02-05 – 2020-02-07 (×3): 20 mg via ORAL
  Filled 2020-02-05 (×3): qty 2

## 2020-02-05 MED ORDER — THIAMINE HCL 100 MG PO TABS
250.0000 mg | ORAL_TABLET | Freq: Every day | ORAL | Status: DC
  Administered 2020-02-05 – 2020-02-07 (×3): 250 mg via ORAL
  Filled 2020-02-05 (×3): qty 3

## 2020-02-05 MED ORDER — METOPROLOL SUCCINATE ER 50 MG PO TB24
75.0000 mg | ORAL_TABLET | Freq: Every day | ORAL | Status: DC
  Administered 2020-02-06 – 2020-02-07 (×2): 75 mg via ORAL
  Filled 2020-02-05: qty 3
  Filled 2020-02-05: qty 1

## 2020-02-05 MED ORDER — APIXABAN 5 MG PO TABS
5.0000 mg | ORAL_TABLET | Freq: Two times a day (BID) | ORAL | Status: DC
  Administered 2020-02-05 – 2020-02-07 (×4): 5 mg via ORAL
  Filled 2020-02-05 (×4): qty 1

## 2020-02-05 MED ORDER — METOPROLOL TARTRATE 5 MG/5ML IV SOLN
INTRAVENOUS | Status: AC
  Filled 2020-02-05: qty 5

## 2020-02-05 MED ORDER — ONDANSETRON HCL 4 MG/2ML IJ SOLN
4.0000 mg | Freq: Four times a day (QID) | INTRAMUSCULAR | Status: DC | PRN
  Filled 2020-02-05: qty 2

## 2020-02-05 MED ORDER — METOPROLOL TARTRATE 5 MG/5ML IV SOLN
5.0000 mg | Freq: Once | INTRAVENOUS | Status: AC
  Administered 2020-02-05: 5 mg via INTRAVENOUS
  Filled 2020-02-05: qty 5

## 2020-02-05 MED ORDER — NITROGLYCERIN 0.4 MG SL SUBL: 0.4000 mg | SUBLINGUAL_TABLET | SUBLINGUAL | Status: DC | PRN

## 2020-02-05 MED ORDER — DOCUSATE SODIUM 100 MG PO CAPS
100.0000 mg | ORAL_CAPSULE | Freq: Two times a day (BID) | ORAL | Status: DC
  Administered 2020-02-05 – 2020-02-07 (×3): 100 mg via ORAL
  Filled 2020-02-05 (×4): qty 1

## 2020-02-05 MED ORDER — SODIUM CHLORIDE 0.9 % IV BOLUS
500.0000 mL | Freq: Once | INTRAVENOUS | Status: AC
  Administered 2020-02-05: 500 mL via INTRAVENOUS

## 2020-02-05 MED ORDER — ADULT MULTIVITAMIN W/MINERALS CH
1.0000 | ORAL_TABLET | Freq: Every day | ORAL | Status: DC
  Administered 2020-02-05 – 2020-02-07 (×3): 1 via ORAL
  Filled 2020-02-05 (×3): qty 1

## 2020-02-05 MED ORDER — POTASSIUM CHLORIDE CRYS ER 20 MEQ PO TBCR
40.0000 meq | EXTENDED_RELEASE_TABLET | Freq: Once | ORAL | Status: AC
  Administered 2020-02-05: 40 meq via ORAL
  Filled 2020-02-05: qty 2

## 2020-02-05 MED ORDER — DILTIAZEM HCL-DEXTROSE 125-5 MG/125ML-% IV SOLN (PREMIX)
5.0000 mg/h | INTRAVENOUS | Status: DC
  Administered 2020-02-05: 5 mg/h via INTRAVENOUS
  Administered 2020-02-06 (×2): 15 mg/h via INTRAVENOUS
  Filled 2020-02-05 (×3): qty 125

## 2020-02-05 MED ORDER — PANTOPRAZOLE SODIUM 40 MG PO TBEC
40.0000 mg | DELAYED_RELEASE_TABLET | Freq: Every day | ORAL | Status: DC
  Administered 2020-02-05 – 2020-02-07 (×3): 40 mg via ORAL
  Filled 2020-02-05 (×3): qty 1

## 2020-02-05 MED ORDER — MAGNESIUM SULFATE 2 GM/50ML IV SOLN
2.0000 g | Freq: Once | INTRAVENOUS | Status: AC
  Administered 2020-02-06: 2 g via INTRAVENOUS
  Filled 2020-02-05: qty 50

## 2020-02-05 MED ORDER — METOPROLOL SUCCINATE ER 25 MG PO TB24
75.0000 mg | ORAL_TABLET | Freq: Every day | ORAL | Status: DC
  Administered 2020-02-05: 75 mg via ORAL
  Filled 2020-02-05: qty 3

## 2020-02-05 MED ORDER — FOLIC ACID 1 MG PO TABS
1.0000 mg | ORAL_TABLET | Freq: Every day | ORAL | Status: DC
  Administered 2020-02-05 – 2020-02-07 (×3): 1 mg via ORAL
  Filled 2020-02-05 (×3): qty 1

## 2020-02-05 NOTE — ED Notes (Signed)
Paged cardiology on call provider to notify of pt's K and Mg

## 2020-02-05 NOTE — ED Notes (Signed)
CBC obtained. Sent to lab via tube station 74 at this time.

## 2020-02-05 NOTE — ED Provider Notes (Signed)
Emergency Department Provider Note   I have reviewed the triage vital signs and the nursing notes.   HISTORY  Chief Complaint Fall   HPI Darryl Diaz is a 79 y.o. male with PMH of a-fib on eliquis presents to the ED after mechanical fall at home. Patient was walking to the mailbox when he lost his balance and fell. He struck his forehead on the mailbox and then fell to the ground without hitting his head on the ground. He fell in the grass. EMS was called and transported to the ED. Denies any near syncope type symptoms prior to falling. He is having some neck stiffness. Denies any unilateral weakness/nubmness. No back pain. Denies CP or SOB. He does have an abrasion to the right knee but no pain with movement.   Past Medical History:  Diagnosis Date  . 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 Anemia   . Atrial fibrillation ([place])   . Bradycardia    low 20's  . Cardiac arrest ([place]) 04/20/2019   12 min CPR with epi  . Dysrhythmia   . Fall at home 03/28/2016  . Fatty liver   . GERD (gastroesophageal reflux disease)   . H/O seasonal allergies   . History of GI bleed   . Hx of adenomatous colonic polyps 08/10/2017  . Hyperlipidemia   . Hypertension   . Insomnia   . 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 MI (myocardial infarction) ([place]) 04/2019   had CPR by EMS  . OA (osteoarthritis)    hands  . OSA (obstructive sleep apnea)    No longer uses CPAP  . Persistent atrial fibrillation with rapid ventricular response ([place]) 02/05/2020    Patient Active Problem List   Diagnosis Date Noted  . Weakness 02/06/2020  . Persistent atrial fibrillation with rapid ventricular response ([place]) 02/05/2020  . 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 Bradycardia 04/20/2019  . Atrial fibrillation with rapid ventricular response ([place])   . GI bleed 02/25/2019  . Fall 02/25/2019  . Bilateral leg edema 02/25/2019  .  Hyponatremia 02/25/2019  . Acute posthemorrhagic anemia 08/11/2018  . Symptomatic anemia 08/11/2018  . Hx of adenomatous colonic polyps 08/10/2017  . PAF (paroxysmal atrial fibrillation) ([place]) 02/05/2017  . Dislocation of hip, posterior, left, closed ([place]) 04/30/2016  . Hip fracture ([place]) 03/30/2016  . Hypertension 03/30/2016  . Hyperlipidemia 03/30/2016  . GERD (gastroesophageal reflux disease) 03/30/2016  . Osteoarthritis 03/30/2016  . Depression 03/30/2016  . Rhabdomyolysis 03/30/2016  . Leukocytosis 03/30/2016  . Pressure injury of skin 03/30/2016  . Closed fracture of left hip [facility])     Past Surgical History:  Procedure Laterality Date  . ANKLE SURGERY Left 2014   had rods put in   . BIOPSY  02/28/2019   Procedure: BIOPSY;  Surgeon: Milus Banister, MD;  Location: [facility] ENDOSCOPY;  Service: Endoscopy;;  . [place]  . ESOPHAGOGASTRODUODENOSCOPY (EGD) WITH PROPOFOL N/A 02/28/2019   Procedure: ESOPHAGOGASTRODUODENOSCOPY (EGD) WITH PROPOFOL;  Surgeon: Milus Banister, MD;  Location[facility] ENDOSCOPY;  Service: Endoscopy;  Laterality: N/A;  . HIP ARTHROPLASTY Left 04/01/2016   Procedure: ARTHROPLASTY BIPOLAR HIP (HEMIARTHROPLASTY);  Surgeon: Paralee Cancel, MD;  Location: [place];  Service: Orthopedics;  Laterality: Left;  . HIP CLOSED REDUCTION Left 04/30/2016   Procedure: CLOSED REDUCTION HIP;  Surgeon: Wylene Simmer, MD;  Location: [place];  Service:  Orthopedics;  Laterality: Left;  . TONSILLECTOMY    . TOTAL HIP ARTHROPLASTY Right 10/15/2019   Procedure: TOTAL HIP ARTHROPLASTY ANTERIOR APPROACH;  Surgeon: Paralee Cancel, MD;  Location: [facility] ORS;  Service: Orthopedics;  Laterality: Right;  70 mins    Allergies Penicillins  Family History  Problem Relation Age of Onset  . Heart disease Mother 54  . Hypertension Mother   . Arthritis Mother   . Heart failure Father 77  . Stroke Maternal Aunt   . Heart failure Sister   . Colon cancer Neg Hx   . Esophageal cancer Neg  Hx   . Stomach cancer Neg Hx   . Rectal cancer Neg Hx     Social History Social History   Tobacco Use  . Smoking status: Former Smoker    Types: Cigarettes  . Smokeless tobacco: Never Used  . Tobacco comment:    quit 20 years  smoked on and off for 20 years  Vaping Use  . Vaping Use: Never used  Substance Use Topics  . Alcohol use: No    Comment: 03/2016   i HAD ONE DRINK & THAT WAS THE FIRST DRINK i HAD IN A Lejuan Botto TIME   . Drug use: No    Review of Systems  Constitutional: No fever/chills Eyes: No visual changes. ENT: No sore throat. Cardiovascular: Denies chest pain. Respiratory: Denies shortness of breath. Gastrointestinal: No abdominal pain.  No nausea, no vomiting.  No diarrhea.  No constipation. Genitourinary: Negative for dysuria. Musculoskeletal: Negative for back pain. Positive neck pain.  Skin: Negative for rash. Positive abrasion to the right knee.  Neurological: Negative for focal weakness or numbness. Positive HA.   10-point ROS otherwise negative.  ____________________________________________   PHYSICAL EXAM:  VITAL SIGNS: ED Triage Vitals  Enc Vitals Group     BP 02/05/20 1124 102/83     Pulse Rate 02/05/20 1124 68     Resp 02/05/20 1124 16     Temp 02/05/20 1124 98.1 F (36.7 C)     Temp Source 02/05/20 1124 Oral     SpO2 02/05/20 1106 98 %     Weight 02/05/20 1124 231 lb (104.8 kg)     Height 02/05/20 1124 6' (1.829 m)   Constitutional: Alert and oriented. Well appearing and in no acute distress. Eyes: Conjunctivae are normal.  Head: Atraumatic. Nose: No congestion/rhinnorhea. Mouth/Throat: Mucous membranes are moist.   Neck: No stridor. No cervical spine tenderness to palpation. Cardiovascular: Normal rate, regular rhythm. Good peripheral circulation. Grossly normal heart sounds.   Respiratory: Normal respiratory effort.  No retractions. Lungs CTAB. Gastrointestinal: Soft and nontender. No distention.  Musculoskeletal: Abrasion to the  right knee. No effusion. Normal ROM of the bilateral knees, ankles, and hips.  Neurologic:  Normal speech and language. No gross focal neurologic deficits are appreciated.  Skin:  Skin is warm and dry. No rash noted. Abrasion to the right knee.    ____________________________________________   LABS (all labs ordered are listed, but only abnormal results are displayed)  Labs Reviewed  BASIC METABOLIC PANEL - Abnormal; Notable for the following components:      Result Value   Sodium 128 (*)    Chloride 92 (*)    CO2 18 (*)    Glucose, Bld 105 (*)    Creatinine, Ser 1.32 (*)    Calcium 8.7 (*)    GFR calc non Af Amer 51 (*)    GFR calc Af Amer 59 (*)    Anion  gap 18 (*)    All other components within normal limits  CBC WITH DIFFERENTIAL/PLATELET - Abnormal; Notable for the following components:   RBC 3.16 (*)    Hemoglobin 10.0 (*)    HCT 29.8 (*)    Platelets 116 (*)    All other components within normal limits  BASIC METABOLIC PANEL - Abnormal; Notable for the following components:   Sodium 130 (*)    Potassium 2.7 (*)    Chloride 95 (*)    CO2 21 (*)    Glucose, Bld 155 (*)    Creatinine, Ser 1.31 (*)    Calcium 8.6 (*)    GFR calc non Af Amer 52 (*)    All other components within normal limits  POTASSIUM - Abnormal; Notable for the following components:   Potassium 2.7 (*)    All other components within normal limits  MAGNESIUM - Abnormal; Notable for the following components:   Magnesium 0.9 (*)    All other components within normal limits  COMPREHENSIVE METABOLIC PANEL - Abnormal; Notable for the following components:   Sodium 131 (*)    Potassium 3.2 (*)    Chloride 96 (*)    Glucose, Bld 125 (*)    Calcium 8.5 (*)    Total Protein 5.4 (*)    Albumin 3.1 (*)    AST 66 (*)    Total Bilirubin 1.7 (*)    GFR calc non Af Amer 55 (*)    All other components within normal limits  RESPIRATORY PANEL BY RT PCR (FLU A&B, COVID)  URINE CULTURE  TSH  CBC WITH  DIFFERENTIAL/PLATELET  URINALYSIS, COMPLETE (UACMP) WITH MICROSCOPIC  BRAIN NATRIURETIC PEPTIDE  BASIC METABOLIC PANEL  TROPONIN I (HIGH SENSITIVITY)   ____________________________________________  EKG   EKG Interpretation  Date/Time:  Thursday February 06 2020 02:58:19 EDT Ventricular Rate:  115 PR Interval:    QRS Duration: 104 QT Interval:  350 QTC Calculation: 485 R Axis:   -30 Text Interpretation: Atrial fibrillation Inferior infarct, old Anterior infarct, age indeterminate When compared with ECG of 02/05/2020, Premature ventricular complexes are no longer present Confirmed by Delora Fuel ([id number]) on 02/06/2020 4:23:59 AM       ____________________________________________  RADIOLOGY  CT Head Wo Contrast  Result Date: 02/05/2020 CLINICAL DATA:  Head trauma.  Fall, dizzy on anticoagulation. EXAM: CT HEAD WITHOUT CONTRAST TECHNIQUE: Contiguous axial images were obtained from the base of the skull through the vertex without intravenous contrast. COMPARISON:  02/25/2019 FINDINGS: Brain: No evidence of acute large vascular territory infarction, hemorrhage, hydrocephalus, extra-axial collection or mass lesion/mass effect. Similar scattered white matter hypoattenuation, compatible with chronic microvascular ischemic disease. Similar diffuse cerebral volume loss with ex vacuo ventricular dilation. Vascular: Calcific intracranial atherosclerosis. Skull: Normal. Negative for fracture or focal lesion. Sinuses/Orbits: Mild scattered ethmoid air cell mucosal thickening and trace inferior left maxillary sinus mucosal thickening. Otherwise, the sinuses are clear. No evidence of acute orbital abnormality. Other: Underpneumatized left mastoid air cells with chronic trace left mastoid effusion. IMPRESSION: 1. No evidence of acute intracranial abnormality. 2. Similar chronic microvascular ischemic disease and generalized cerebral atrophy. Electronically Signed   By: Margaretha Sheffield MD   On:  02/05/2020 14:56   CT Cervical Spine Wo Contrast  Result Date: 02/05/2020 CLINICAL DATA:  Neck trauma.  Fall. EXAM: CT CERVICAL SPINE WITHOUT CONTRAST TECHNIQUE: Multidetector CT imaging of the cervical spine was performed without intravenous contrast. Multiplanar CT image reconstructions were also generated. COMPARISON:  None. FINDINGS: Alignment: Mild  anterolisthesis of C4 on C5, favor degenerative given severe facet degenerative change at this level. Reversal of the normal cervical lordosis, favored positional/degenerative in etiology. Otherwise, no substantial subluxation. Skull base and vertebrae: No acute fracture. Vertebral body heights are maintained. No primary bone lesion or focal pathologic process. Soft tissues and spinal canal: No prevertebral fluid or swelling. No visible large canal hematoma. Disc levels: Ankylosis across the left C3-C4 and right C5-C6 facet joints. 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 Upper chest: Negative. Please see separately dictated CT of the head for intracranial valuation. IMPRESSION: 1. No evidence of acute fracture or traumatic malalignment. 2. 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 Electronically Signed   By: Margaretha Sheffield MD   On: 02/05/2020 15:04   DG Chest Port 1 View  Result Date: 02/05/2020 CLINICAL DATA:  Atrial fibrillation, presenting after mechanical fall. EXAM: PORTABLE CHEST 1 VIEW COMPARISON:  Comparison chest x-ray 04/22/2019 FINDINGS: The heart size and mediastinal contours are sign unchanged. No focal consolidation. No pulmonary edema. No pleural effusion. No pneumothorax. No acute displaced fracture. IMPRESSION: No active disease. Electronically Signed   By: Iven Finn M.D.   On: 02/05/2020 17:16     ____________________________________________   PROCEDURES  Procedure(s) performed:   Procedures  CRITICAL CARE Performed by: Margette Fast Total critical care time: 35 minutes Critical care time was exclusive of separately billable procedures and treating other patients. Critical care was necessary to treat or prevent imminent or life-threatening deterioration. Critical care was time spent personally by me on the following activities: development of treatment plan with patient and/or surrogate as well as nursing, discussions with consultants, evaluation of patient's response to treatment, examination of patient, obtaining history from patient or surrogate, ordering and performing treatments and interventions, ordering and review of laboratory studies, ordering and review of radiographic studies, pulse oximetry and re-evaluation of patient's condition.  Nanda Quinton, MD Emergency Medicine  ____________________________________________   INITIAL IMPRESSION / ASSESSMENT AND PLAN / ED COURSE  Pertinent labs & imaging results that were available during my care of the patient were reviewed by me and considered in my medical decision making (see chart for details).   Patient presents to the emergency department by EMS after mechanical fall.  He struck his head on the mailbox upon falling but has no outward sign of injury.  No head injury from falling to the ground.  No near syncope symptoms prior to fall.  Plan for CT imaging of the head and cervical spine.  Patient does tell me that he is having some generalized weakness and has had anemia requiring transfusion in the past. Plan for basic blood work here as well.   11:50 AM  Patient with A-fib on EKG with rate in the 130s. Will give IV Metoprolol. No symptoms.   Labs and imaging reviewed.  I am giving additional metoprolol along with IV fluids to try and control rate.  CBC is pending.  Care transferred to Dr. Bobby Rumpf with further  attempt at ED rate control and to follow-up on additional lab work. ____________________________________________  FINAL CLINICAL IMPRESSION(S) / ED DIAGNOSES  Final diagnoses:  Fall, initial encounter  Atrial fibrillation with RVR ([place])     MEDICATIONS GIVEN DURING THIS VISIT:  Medications  diltiazem (CARDIZEM) 125 mg in dextrose 5% 125 mL (1 mg/mL) infusion (15 mg/hr Intravenous New Bag/Given 02/06/20  0148)  nitroGLYCERIN (NITROSTAT) SL tablet 0.4 mg (has no administration in time range)  acetaminophen (TYLENOL) tablet 650 mg (has no administration in time range)  ondansetron (ZOFRAN) injection 4 mg (has no administration in time range)  zolpidem (AMBIEN) tablet 5 mg (has no administration in time range)  sodium chloride flush (NS) 0.9 % injection 3 mL (3 mLs Intravenous Not Given 02/05/20 2211)  sodium chloride flush (NS) 0.9 % injection 3 mL (has no administration in time range)  0.9 %  sodium chloride infusion (has no administration in time range)  ALPRAZolam (XANAX) tablet 0.25 mg (has no administration in time range)  apixaban (ELIQUIS) tablet 5 mg (5 mg Oral Given 02/05/20 2136)  atorvastatin (LIPITOR) tablet 20 mg (20 mg Oral Given 02/05/20 2135)  docusate sodium (COLACE) capsule 100 mg (100 mg Oral Given 02/05/20 2135)  escitalopram (LEXAPRO) tablet 10 mg (10 mg Oral Given 02/05/20 2136)  ferrous sulfate tablet 325 mg (has no administration in time range)  folic acid (FOLVITE) tablet 1 mg (1 mg Oral Given 02/05/20 2137)  HYDROcodone-acetaminophen (NORCO/VICODIN) 5-325 MG per tablet 1-2 tablet (has no administration in time range)  loratadine (CLARITIN) tablet 10 mg (10 mg Oral Given 02/05/20 2136)  methocarbamol (ROBAXIN) tablet 500 mg (has no administration in time range)  metoprolol succinate (TOPROL-XL) 24 hr tablet 75 mg (75 mg Oral Not Given 02/05/20 2117)  multivitamin with minerals tablet 1 tablet (1 tablet Oral Given 02/05/20 2136)  pantoprazole (PROTONIX) EC tablet 40 mg (40  mg Oral Given 02/05/20 2138)  polyethylene glycol (MIRALAX / GLYCOLAX) packet 17 g (17 g Oral Given 02/05/20 2134)  tamsulosin (FLOMAX) capsule 0.4 mg (0.4 mg Oral Given 02/05/20 2136)  thiamine tablet 250 mg (250 mg Oral Given 02/05/20 2137)  traZODone (DESYREL) tablet 50 mg (50 mg Oral Given 02/05/20 2136)  metoprolol tartrate (LOPRESSOR) injection 2.5 mg (has no administration in time range)  potassium chloride SA (KLOR-CON) CR tablet 40 mEq (40 mEq Oral Given 02/06/20 0702)  sodium chloride 0.9 % bolus 1,000 mL (has no administration in time range)  metoprolol tartrate (LOPRESSOR) injection 5 mg (5 mg Intravenous Given 02/05/20 1205)  sodium chloride 0.9 % bolus 500 mL (0 mLs Intravenous Stopped 02/05/20 1442)  metoprolol tartrate (LOPRESSOR) injection 10 mg ( Intravenous Canceled Entry 02/05/20 1538)  potassium chloride SA (KLOR-CON) CR tablet 40 mEq (40 mEq Oral Given 02/05/20 2135)  magnesium oxide (MAG-OX) tablet 800 mg (800 mg Oral Given 02/06/20 0312)  magnesium sulfate IVPB 2 g 50 mL (0 g Intravenous Stopped 02/06/20 0147)  potassium chloride SA (KLOR-CON) CR tablet 40 mEq (40 mEq Oral Given 02/06/20 0150)  magnesium sulfate IVPB 2 g 50 mL (0 g Intravenous Stopped 02/06/20 0815)    Note:  This document was prepared using Dragon voice recognition software and may include unintentional dictation errors.  Nanda Quinton, MD, [facility] Emergency Medicine    Christhoper Busbee, [facility], MD 02/06/20 1009

## 2020-02-05 NOTE — H&P (Addendum)
Cardiology History and Physical:   Patient ID: Darryl Diaz; [id number]; Nov 01, 1940   Admit date: 02/05/2020 Date of Consult: 02/05/2020  Primary Care Provider: [name], MD Primary Cardiologist: Sherren Mocha, MD 10/25/2019 Primary Electrophysiologist:  Virl Axe, MD   Patient Profile:   Darryl Diaz is a 79 y.o. male with a hx of PAF, GIB 2nd Mallory Weiss tear, bradycardic arrest 04/2019 2nd metop/cardizem/amio combo w/ grapefruit juice, anemia, GERD, HTN, HLD, OSA not on CPAP, who is being seen today for the evaluation of rapid atrial fib at the request of Dr Rogene Houston.  History of Present Illness:   Darryl Diaz has not checked his BP/HR recently.   In the last few days, he has been exhausted. He becomes extremely SOB walking 25 feet at a time. Mild LE edema, no orthopnea or PND.  He has not had chest pain.   No palpitations.   Today, he was walking outside and leaned over to pick something up. He lost balance and fell. Denies LOC, just feels that he got off balance.   He had significant MS pain after the fall. He came to the ER. He does not have any fractures or other critical injuries.  He was in rapid atrial fibrillation on arrival. He was started on IV Diltiazem, up-titrated to 15 mg/hr, and given his home dose of BB. However, his HR is not well-controlled.  He is not aware of the atrial fibrillation at all. Does no know when it started.   Has not missed any doses of Eliquis recently.   Had a sandwich in the last hour.    Past Medical History:  Diagnosis Date  . 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 Anemia   . Atrial fibrillation ([place])   . Bradycardia    low 20's  . Cardiac arrest ([place]) 04/20/2019   12 min CPR with epi  . Dysrhythmia   . Fall at home 03/28/2016  . Fatty liver   . GERD (gastroesophageal reflux disease)   . H/O seasonal allergies   . History of GI bleed   . Hx of adenomatous colonic polyps 08/10/2017    . Hyperlipidemia   . Hypertension   . Insomnia   . 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 MI (myocardial infarction) ([place]) 04/2019   had CPR by EMS  . OA (osteoarthritis)    hands  . OSA (obstructive sleep apnea)    No longer uses CPAP    Past Surgical History:  Procedure Laterality Date  . ANKLE SURGERY Left 2014   had rods put in   . BIOPSY  02/28/2019   Procedure: BIOPSY;  Surgeon: Milus Banister, MD;  Location: [facility] ENDOSCOPY;  Service: Endoscopy;;  . [place]  . ESOPHAGOGASTRODUODENOSCOPY (EGD) WITH PROPOFOL N/A 02/28/2019   Procedure: ESOPHAGOGASTRODUODENOSCOPY (EGD) WITH PROPOFOL;  Surgeon: Milus Banister, MD;  Location: [facility] ENDOSCOPY;  Service: Endoscopy;  Laterality: N/A;  . HIP ARTHROPLASTY Left 04/01/2016   Procedure: ARTHROPLASTY BIPOLAR HIP (HEMIARTHROPLASTY);  Surgeon: Paralee Cancel, MD;  Location: [facility];  Service: Orthopedics;  Laterality: Left;  . HIP CLOSED REDUCTION Left 04/30/2016   Procedure: CLOSED REDUCTION HIP;  Surgeon: Wylene Simmer, MD;  Location: [facility];  Service: Orthopedics;  Laterality: Left;  . TONSILLECTOMY    . TOTAL HIP ARTHROPLASTY Right 10/15/2019   Procedure: TOTAL HIP ARTHROPLASTY ANTERIOR APPROACH;  Surgeon: Paralee Cancel, MD;  Location: [facility] ORS;  Service: Orthopedics;  Laterality: Right;  70 mins     Prior to Admission medications   Medication Sig Start Date End Date Taking? Authorizing Provider  apixaban (ELIQUIS) 5 MG TABS tablet Take 1 tablet (5 mg total) by mouth 2 (two) times daily. 08/24/18   Geradine Girt, DO  atorvastatin (LIPITOR) 20 MG tablet Take 20 mg by mouth daily.    [provider]  docusate sodium (COLACE) 100 MG capsule Take 1 capsule (100 mg total) by mouth 2 (two) times daily. 10/16/19   Danae Orleans, PA-C  escitalopram (LEXAPRO) 10 MG tablet Take 10 mg by mouth daily.    [provider]  ferrous sulfate (FERROUSUL) 325 (65 FE) MG tablet Take 1 tablet (325 mg total) by  mouth 3 (three) times daily with meals for 14 days. 10/16/19 10/30/19  Danae Orleans, PA-C  folic acid (FOLVITE) 1 MG tablet Take 1 mg by mouth daily.    [provider]  furosemide (LASIX) 40 MG tablet Take 1 tablet (40 mg total) by mouth daily as needed for fluid (weight gain). 06/13/19   Kathyrn Drown D, NP  HYDROcodone-acetaminophen (NORCO) 5-325 MG tablet Take 1-2 tablets by mouth every 6 (six) hours as needed for moderate pain or severe pain. 10/16/19   Danae Orleans, PA-C  loratadine (CLARITIN) 10 MG tablet Take 10 mg by mouth daily.    [provider]  methocarbamol (ROBAXIN) 500 MG tablet Take 1 tablet (500 mg total) by mouth every 6 (six) hours as needed for muscle spasms. 10/16/19   Danae Orleans, PA-C  metoprolol succinate (TOPROL-XL) 25 MG 24 hr tablet Take 3 tablets (75 mg total) by mouth daily. 08/26/19   Deboraha Sprang, MD  Multiple Vitamin (MULTIVITAMIN WITH MINERALS) TABS tablet Take 1 tablet by mouth daily.    [provider]  pantoprazole (PROTONIX) 40 MG tablet Take 40 mg by mouth daily.    [provider]  polyethylene glycol (MIRALAX / GLYCOLAX) 17 g packet Take 17 g by mouth 2 (two) times daily. 10/16/19   Danae Orleans, PA-C  potassium chloride SA (KLOR-CON) 20 MEQ tablet Take 2 tablets by mouth once daily 12/30/19   Sherren Mocha, MD  tamsulosin [facility]) 0.4 MG CAPS capsule Take 0.4 mg by mouth at bedtime.     [provider]  Thiamine HCl (VITAMIN B-1) 250 MG tablet Take 250 mg by mouth daily.    [provider]  traZODone (DESYREL) 50 MG tablet Take 50 mg by mouth at bedtime.    [provider]    Inpatient Medications: Scheduled Meds: . metoprolol succinate  75 mg Oral Daily   Continuous Infusions: . diltiazem (CARDIZEM) infusion 15 mg/hr (02/05/20 1913)   PRN Meds:   Allergies:    Allergies  Allergen Reactions  . Penicillins Other (See Comments)    Tolerated Cephalosporin Date: 10/15/19.  Did  it involve swelling of the face/tongue/throat, SOB, or low BP? Unknown Did it involve sudden or severe rash/hives, skin peeling, or any reaction on the inside of your mouth or nose? Unknown Did you need to seek medical attention at a hospital or doctor's office? Unknown When did it last happen?childhood reaction If all above answers are "NO", may proceed with cephalosporin use.    Social History:   Social History   Socioeconomic History  . Marital status: Widowed    Spouse name: Not on file  . Number of children: 2  . Years of education: college  . Highest education  level: Not on file  Occupational History  . Occupation: retired  Tobacco Use  . Smoking status: Former Smoker    Types: Cigarettes  . Smokeless tobacco: Never Used  . Tobacco comment:    quit 20 years  smoked on and off for 20 years  Vaping Use  . Vaping Use: Never used  Substance and Sexual Activity  . Alcohol use: No    Comment: 03/2016   i HAD ONE DRINK & THAT WAS THE FIRST DRINK i HAD IN A LONG TIME   . Drug use: No  . Sexual activity: Not Currently  Other Topics Concern  . Not on file  Social History Narrative   The patient is divorced w/ 1 son 1 daughter   He is a retired Regulatory affairs officer, he lived in [place] but [employer] including windows on the world in the [place] room in [place]   He moved to [place] to be near his sister who lives in [place] alcoholic member of AA   2 caffeinated beverages daily prior smoker no drugs   Social Determinants of Radio broadcast assistant Strain:   . Difficulty of Paying Living Expenses: Not on file  Food Insecurity:   . Worried About Charity fundraiser in the Last Year: Not on file  . Ran Out of Food in the Last Year: Not on file  Transportation Needs:   . Lack of Transportation (Medical): Not on file  . Lack of Transportation (Non-Medical): Not on file  Physical Activity:   . Days of Exercise per Week: Not on  file  . Minutes of Exercise per Session: Not on file  Stress:   . Feeling of Stress : Not on file  Social Connections:   . Frequency of Communication with Friends and Family: Not on file  . Frequency of Social Gatherings with Friends and Family: Not on file  . Attends Religious Services: Not on file  . Active Member of Clubs or Organizations: Not on file  . Attends Archivist Meetings: Not on file  . Marital Status: Not on file  Intimate Partner Violence:   . Fear of Current or Ex-Partner: Not on file  . Emotionally Abused: Not on file  . Physically Abused: Not on file  . Sexually Abused: Not on file    Family History:   Family History  Problem Relation Age of Onset  . Heart disease Mother 31  . Hypertension Mother   . Arthritis Mother   . Heart failure Father 40  . Stroke Maternal Aunt   . Heart failure Sister   . Colon cancer Neg Hx   . Esophageal cancer Neg Hx   . Stomach cancer Neg Hx   . Rectal cancer Neg Hx    Family Status:  Family Status  Relation Name Status  . Mother  Deceased  . Father  Deceased  . Sister  Alive  . Mat Aunt  Deceased  . Sister  Deceased  . MGM  Deceased  . MGF  Deceased  . PGM  Deceased  . PGF  Deceased  . Daughter  Alive  . Son  Alive  . Neg Hx  (Not Specified)    ROS:  Please see the history of present illness.  All other ROS reviewed and negative.     Physical Exam/Data:   Vitals:   02/05/20 1800 02/05/20 1830 02/05/20 1845 02/05/20 1905  BP: 119/77 101/77 107/76 98/66  Pulse: Marland Kitchen)  124 (!) 140 (!) 130 (!) 144  Resp: (!) 31 19 19  (!) 27  Temp:      TempSrc:      SpO2: 97% 100% 97% 94%  Weight:      Height:        Intake/Output Summary (Last 24 hours) at 02/05/2020 1948 Last data filed at 02/05/2020 1913 Gross per 24 hour  Intake 512.3 ml  Output --  Net 512.3 ml    Last 3 Weights 02/05/2020 10/25/2019 10/15/2019  Weight (lbs) 231 lb 236 lb 6.4 oz 231 lb 6 oz  Weight (kg) 104.781 kg 107.23 kg 104.951 kg      Body mass index is 31.33 kg/m.   General:  Well nourished, well developed, male in no acute distress HEENT: normal Lymph: no adenopathy Neck: JVD - not elevated  Endocrine:  No thryomegaly Vascular: No carotid bruits; 4/4 extremity pulses 2+  Cardiac:  normal S1, S2; rapid and irregular R&R; no murmur Lungs:  clear bilaterally, no wheezing, rhonchi or rales  Abd: soft, nontender, no hepatomegaly  Ext: no edema Musculoskeletal:  No deformities, BUE and BLE strength normal and equal Skin: warm and dry  Neuro:  CNs 2-12 intact, no focal abnormalities noted Psych:  Normal affect   EKG:  The EKG was personally reviewed and demonstrates:  Atrial fibrillation, RVR, HR 134 Telemetry:  Telemetry was personally reviewed and demonstrates:  Atrial fibrillation, RVR   CV studies:   ECHO: 05/09/2019 1. The left ventricle has no regional wall motion abnormalities.  2. LVEF is 60-65%.  3. Left atrial size was severely dilated.  4. The mitral valve was not assessed. not assessed mitral valve  regurgitation. No evidence of mitral stenosis.  5. The tricuspid valve is normal in structure. Tricuspid valve  regurgitation is trivial.  6. The pulmonic valve was not well visualized. Pulmonic valve  regurgitation not assessed.  7. Limited echo s/p cardiac arrest  8. Not assessed.  9. The aortic valve was not assessed. Aortic valve regurgitation not  assessed.  10. Left ventricular ejection fraction, by visual estimation, is 60 to  65%. The left ventricle has normal function. There is not assseded.  11. Global right ventricle has normal systolic function.The right  ventricular size is not assessed. Right vetricular wall thickness was not  assessed.    Laboratory Data:   Chemistry Recent Labs  Lab 02/05/20 1138 02/05/20 1909  NA 128* 130*  K 4.0 2.7*  CL 92* 95*  CO2 18* 21*  GLUCOSE 105* 155*  BUN 17 16  CREATININE 1.32* 1.31*  CALCIUM 8.7* 8.6*  GFRNONAA 51* 52*  GFRAA  59* >60  ANIONGAP 18* 14    Lab Results  Component Value Date   ALT 50 (H) 03/05/2019   AST 73 (H) 03/05/2019   ALKPHOS 84 03/05/2019   BILITOT 1.3 (H) 03/05/2019   Hematology Recent Labs  Lab 02/05/20 1439  WBC 7.1  RBC 3.16*  HGB 10.0*  HCT 29.8*  MCV 94.3  MCH 31.6  MCHC 33.6  RDW 15.0  PLT 116*   Cardiac Enzymes High Sensitivity Troponin:  No results for input(s): TROPONINIHS in the last 720 hours.    BNPNo results for input(s): BNP, PROBNP in the last 168 hours.  DDimer No results for input(s): DDIMER in the last 168 hours. TSH:  Lab Results  Component Value Date   TSH 3.727 04/22/2019   Lipids: Lab Results  Component Value Date   CHOL 81 02/28/2019   HDL  47 02/28/2019   LDLCALC 29 02/28/2019   TRIG 27 02/28/2019   CHOLHDL 1.7 02/28/2019   HgbA1c: Lab Results  Component Value Date   HGBA1C 4.7 (L) 02/28/2019   Magnesium:  Magnesium  Date Value Ref Range Status  04/22/2019 1.5 (L) 1.7 - 2.4 mg/dL Final    Comment:    Performed at [facility], [place] [address], [place], [place] [address]     Radiology/Studies:  CT Head Wo Contrast  Result Date: 02/05/2020 CLINICAL DATA:  Head trauma.  Fall, dizzy on anticoagulation. EXAM: CT HEAD WITHOUT CONTRAST TECHNIQUE: Contiguous axial images were obtained from the base of the skull through the vertex without intravenous contrast. COMPARISON:  02/25/2019 FINDINGS: Brain: No evidence of acute large vascular territory infarction, hemorrhage, hydrocephalus, extra-axial collection or mass lesion/mass effect. Similar scattered white matter hypoattenuation, compatible with chronic microvascular ischemic disease. Similar diffuse cerebral volume loss with ex vacuo ventricular dilation. Vascular: Calcific intracranial atherosclerosis. Skull: Normal. Negative for fracture or focal lesion. Sinuses/Orbits: Mild scattered ethmoid air cell mucosal thickening and trace inferior left maxillary sinus mucosal thickening.  Otherwise, the sinuses are clear. No evidence of acute orbital abnormality. Other: Underpneumatized left mastoid air cells with chronic trace left mastoid effusion. IMPRESSION: 1. No evidence of acute intracranial abnormality. 2. Similar chronic microvascular ischemic disease and generalized cerebral atrophy. Electronically Signed   By: Margaretha Sheffield MD   On: 02/05/2020 14:56   CT Cervical Spine Wo Contrast  Result Date: 02/05/2020 CLINICAL DATA:  Neck trauma.  Fall. EXAM: CT CERVICAL SPINE WITHOUT CONTRAST TECHNIQUE: Multidetector CT imaging of the cervical spine was performed without intravenous contrast. Multiplanar CT image reconstructions were also generated. COMPARISON:  None. FINDINGS: Alignment: Mild anterolisthesis of C4 on C5, favor degenerative given severe facet degenerative change at this level. Reversal of the normal cervical lordosis, favored positional/degenerative in etiology. Otherwise, no substantial subluxation. Skull base and vertebrae: No acute fracture. Vertebral body heights are maintained. No primary bone lesion or focal pathologic process. Soft tissues and spinal canal: No prevertebral fluid or swelling. No visible large canal hematoma. Disc levels: Ankylosis across the left C3-C4 and right C5-C6 facet joints. 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 Upper chest: Negative. Please see separately dictated CT of the head for intracranial valuation. IMPRESSION: 1. No evidence of acute fracture or traumatic malalignment. 2. 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 Electronically Signed   By: Margaretha Sheffield MD   On: 02/05/2020 15:04   DG Chest Port 1 View  Result Date: 02/05/2020 CLINICAL DATA:  Atrial fibrillation, presenting after mechanical fall. EXAM: PORTABLE CHEST 1 VIEW COMPARISON:  Comparison  chest x-ray 04/22/2019 FINDINGS: The heart size and mediastinal contours are sign unchanged. No focal consolidation. No pulmonary edema. No pleural effusion. No pneumothorax. No acute displaced fracture. IMPRESSION: No active disease. Electronically Signed   By: Iven Finn M.D.   On: 02/05/2020 17:16    Assessment and Plan:   1. Persistent atrial fib, RVR - pt is unaware, likely started about 3 days ago, when sx of fatigue and weakness began - no doses of Eliquis missed, continue this. - continue IV Dilt, if he converts, he may go slow, easy to adjust based on HR - continue home BB dose, add prn IV metoprolol - since he has eaten, no DCCV tonight - make him NPO after midnight,  schedule DCCV in am.   2. Hyponatremia - new problem, no obvious cause - follow labs  3. Hyponatremia - K+ was 4.0 this am, now 2.7, supplement and follow  Otherwise, continue home rx Active Problems:   * No active hospital problems. *   For questions or updates, please contact [place] Please consult [email or web address] for contact info under Cardiology/STEMI.   Jonetta Speak, PA-C  02/05/2020 7:48 PM   Attending note:  Patient seen and examined.  I reviewed extensive records and discussed the case with Ms. Ahmed Prima PA-C.  Darryl Diaz is followed by Dr. Burt Knack and Dr. Caryl Comes with a history of paroxysmal atrial fibrillation, also prior bradycardic arrest in the setting of combined use of metoprolol, Cardizem, and amiodarone along with regular use of grapefruit juice (possibly calcium channel blocker toxicity).  Most recently he has been managed with Eliquis for stroke prophylaxis and Toprol-XL 75 mg daily, doing well and last assessed by Dr. Burt Knack in June.  He presents today to the ER after a mechanical fall, had walked out to his mailbox, leaned over to pick something up and fell forward hitting his head.  He was found to be in rapid atrial fibrillation on assessment, no acute injuries by CT of  the head and neck.  While in the ER he was given doses of IV Lopressor, ultimately his home dose of Toprol-XL, and due to persistent tachycardia was started on intravenous diltiazem which has been uptitrated to 15 mg/h.  Patient remains in atrial fibrillation with RVR, heart rates 120s to 130s.  He is unaware of any sense of palpitations but does state that over the last few days he has been very weak.  He states that he has not missed any doses of Eliquis or Toprol-XL.  On examination he is in no acute distress.  He is afebrile, heart rate 120 230 in atrial fibrillation by telemetry which I personally reviewed, systolic blood pressure most recently 130.  Lungs exhibit decreased breath sounds without wheezing.  Cardiac exam with irregularly irregular tachycardia, no gallop.  Pertinent lab work includes sodium 130, potassium 2.7 (4.0 on initial assessment), creatinine 1.31, hemoglobin 10.0, platelets 116, SARS coronavirus 2 test negative.  Chest x-ray reports no acute findings.  ECG shows rapid atrial fibrillation with leftward axis and low voltage.  History of paroxysmal atrial fibrillation, now persisting with RVR.  Onset is uncertain but potentially over the last few days given reported weakness.  Not clear that this was necessarily related to his fall either.  He states that he has been compliant with Eliquis and Toprol-XL.  Given previous history of bradycardic arrest on combination AV nodal blockers and amiodarone, he has been kept on Toprol-XL with reasonable control over time, although Dr. Caryl Comes did mention early cardioversion with recurrent arrhythmia so that regimen would not have to be escalated.  Unfortunately, he ate a sandwich about an hour before our evaluation, so cardioversion cannot be done acutely in the ER this evening.  He will be admitted to the hospital, continue IV Cardizem with as needed use of IV Lopressor, would not start amiodarone.  Would recheck and otherwise replete  electrolytes, likely plan on cardioversion tomorrow if he does not spontaneously convert in the meanwhile.  Satira Sark, M.D., F.A.C.C.

## 2020-02-05 NOTE — ED Notes (Signed)
Notified McDowell MD about pt's potassium and magnesium

## 2020-02-05 NOTE — ED Provider Notes (Signed)
Medical screening examination/treatment/procedure(s) were conducted as a shared visit with non-physician practitioner(s) and myself.  I personally evaluated the patient during the encounter.  EKG Interpretation  Date/Time:  Wednesday February 05 2020 11:47:03 EDT Ventricular Rate:  134 PR Interval:    QRS Duration: 99 QT Interval:  328 QTC Calculation: 490 R Axis:   -33 Text Interpretation: Atrial fibrillation Ventricular premature complex Left axis deviation Low voltage, precordial leads Consider anterior infarct No STEMI Confirmed by Nanda Quinton [phone]) on 02/05/2020 11:52:37 AM   CRITICAL CARE Performed by: Fredia Sorrow Total critical care time: 35 minutes Critical care time was exclusive of separately billable procedures and treating other patients. Critical care was necessary to treat or prevent imminent or life-threatening deterioration. Critical care was time spent personally by me on the following activities: development of treatment plan with patient and/or surrogate as well as nursing, discussions with consultants, evaluation of patient's response to treatment, examination of patient, obtaining history from patient or surrogate, ordering and performing treatments and interventions, ordering and review of laboratory studies, ordering and review of radiographic studies, pulse oximetry and re-evaluation of patient's condition.  Patient turned over to me from the daytime emergency physician.  Patient did have a fall he had CT head and neck for that without any acute findings.  They also noted that he was in atrial fibrillation with RVR.  Patient has a history of that.  Is on Xarelto.  The daytime doctor did treat him with beta-blocker and as well as his long-acting beta-blocker.  Seem to improve things a little bit heart rate was down maybe to the upper teens 20s but then he started to go back up gotten 120s 130s started diltiazem drip.  That is now maxed out he still like in the 130s  or 140s.  Patient in no acute distress.  Patient's initial electrolytes this morning showed sodium was down a little bit of 128.  He was given some gentle fluids.  His creatinine was little more elevated than baseline.  Potassium was fine.  I added on chest x-ray which had no acute findings and Covid swab.  Discussed with cardiology on possible neck steps.  They was said they will come see the patient.  In therapy patient is on Xarelto and could have been cardioverted but they thought that medication would work.   Fredia Sorrow, MD 02/05/20 [phone]

## 2020-02-05 NOTE — ED Notes (Signed)
Called CT to find out why images still say exam begun, per CT they will figure it out and take care of it.

## 2020-02-05 NOTE — ED Notes (Signed)
Called lab to check why CBC still has not resulted. CBC has been in process since 1148. Per lab, they are unable to locate the specimen. Will redraw specimen.

## 2020-02-05 NOTE — ED Triage Notes (Signed)
Pt presents to ED from home after mechanical fall after walking to mail box. Pt reports he bent over to pick up piece of mail off the ground, lost his balance, and fell forward. He reports hitting head on mail box, abrasion to right knee, and neck pain. Denies LOC. Endorses taking Eliquis for AFIB.

## 2020-02-05 NOTE — ED Notes (Signed)
Patient transported to CT 

## 2020-02-06 ENCOUNTER — Encounter (HOSPITAL_COMMUNITY)
Admission: EM | Disposition: A | Discharge status: Skilled Nursing Facility | Payer: Self-pay | Source: Home / Self Care | Attending: Cardiovascular Disease

## 2020-02-06 ENCOUNTER — Observation Stay (HOSPITAL_BASED_OUTPATIENT_CLINIC_OR_DEPARTMENT_OTHER): Payer: Medicare Other

## 2020-02-06 ENCOUNTER — Encounter (HOSPITAL_COMMUNITY): Payer: Self-pay | Admitting: Cardiology

## 2020-02-06 ENCOUNTER — Observation Stay (HOSPITAL_COMMUNITY): Payer: Medicare Other | Admitting: Anesthesiology

## 2020-02-06 DIAGNOSIS — E871 Hypo-osmolality and hyponatremia: Secondary | ICD-10-CM | POA: Diagnosis not present

## 2020-02-06 DIAGNOSIS — I4891 Unspecified atrial fibrillation: Secondary | ICD-10-CM

## 2020-02-06 DIAGNOSIS — Z96641 Presence of right artificial hip joint: Secondary | ICD-10-CM | POA: Diagnosis not present

## 2020-02-06 DIAGNOSIS — J302 Other seasonal allergic rhinitis: Secondary | ICD-10-CM | POA: Diagnosis not present

## 2020-02-06 DIAGNOSIS — M6282 Rhabdomyolysis: Secondary | ICD-10-CM | POA: Diagnosis not present

## 2020-02-06 DIAGNOSIS — I499 Cardiac arrhythmia, unspecified: Secondary | ICD-10-CM | POA: Diagnosis not present

## 2020-02-06 DIAGNOSIS — Z880 Allergy status to penicillin: Secondary | ICD-10-CM | POA: Diagnosis not present

## 2020-02-06 DIAGNOSIS — E860 Dehydration: Secondary | ICD-10-CM | POA: Diagnosis not present

## 2020-02-06 DIAGNOSIS — R531 Weakness: Secondary | ICD-10-CM

## 2020-02-06 DIAGNOSIS — G4700 Insomnia, unspecified: Secondary | ICD-10-CM | POA: Diagnosis present

## 2020-02-06 DIAGNOSIS — M1611 Unilateral primary osteoarthritis, right hip: Secondary | ICD-10-CM | POA: Diagnosis not present

## 2020-02-06 DIAGNOSIS — I10 Essential (primary) hypertension: Secondary | ICD-10-CM | POA: Diagnosis not present

## 2020-02-06 DIAGNOSIS — G4733 Obstructive sleep apnea (adult) (pediatric): Secondary | ICD-10-CM | POA: Diagnosis not present

## 2020-02-06 DIAGNOSIS — I252 Old myocardial infarction: Secondary | ICD-10-CM | POA: Diagnosis not present

## 2020-02-06 DIAGNOSIS — Z9181 History of falling: Secondary | ICD-10-CM | POA: Diagnosis not present

## 2020-02-06 DIAGNOSIS — M6281 Muscle weakness (generalized): Secondary | ICD-10-CM | POA: Diagnosis not present

## 2020-02-06 DIAGNOSIS — W01198A Fall on same level from slipping, tripping and stumbling with subsequent striking against other object, initial encounter: Secondary | ICD-10-CM | POA: Diagnosis present

## 2020-02-06 DIAGNOSIS — D649 Anemia, unspecified: Secondary | ICD-10-CM | POA: Diagnosis present

## 2020-02-06 DIAGNOSIS — R1314 Dysphagia, pharyngoesophageal phase: Secondary | ICD-10-CM | POA: Diagnosis not present

## 2020-02-06 DIAGNOSIS — Z23 Encounter for immunization: Secondary | ICD-10-CM | POA: Diagnosis not present

## 2020-02-06 DIAGNOSIS — R001 Bradycardia, unspecified: Secondary | ICD-10-CM | POA: Diagnosis not present

## 2020-02-06 DIAGNOSIS — Z86010 Personal history of colonic polyps: Secondary | ICD-10-CM | POA: Diagnosis not present

## 2020-02-06 DIAGNOSIS — Z20822 Contact with and (suspected) exposure to covid-19: Secondary | ICD-10-CM | POA: Diagnosis not present

## 2020-02-06 DIAGNOSIS — I4819 Other persistent atrial fibrillation: Secondary | ICD-10-CM | POA: Diagnosis not present

## 2020-02-06 DIAGNOSIS — Y92018 Other place in single-family (private) house as the place of occurrence of the external cause: Secondary | ICD-10-CM | POA: Diagnosis not present

## 2020-02-06 DIAGNOSIS — M436 Torticollis: Secondary | ICD-10-CM | POA: Diagnosis present

## 2020-02-06 DIAGNOSIS — R000 Tachycardia, unspecified: Secondary | ICD-10-CM | POA: Diagnosis not present

## 2020-02-06 DIAGNOSIS — Z8719 Personal history of other diseases of the digestive system: Secondary | ICD-10-CM | POA: Diagnosis not present

## 2020-02-06 DIAGNOSIS — F329 Major depressive disorder, single episode, unspecified: Secondary | ICD-10-CM | POA: Diagnosis present

## 2020-02-06 DIAGNOSIS — K219 Gastro-esophageal reflux disease without esophagitis: Secondary | ICD-10-CM | POA: Diagnosis not present

## 2020-02-06 DIAGNOSIS — E876 Hypokalemia: Secondary | ICD-10-CM | POA: Diagnosis not present

## 2020-02-06 DIAGNOSIS — Z7401 Bed confinement status: Secondary | ICD-10-CM | POA: Diagnosis not present

## 2020-02-06 DIAGNOSIS — I480 Paroxysmal atrial fibrillation: Secondary | ICD-10-CM | POA: Diagnosis not present

## 2020-02-06 DIAGNOSIS — Z96643 Presence of artificial hip joint, bilateral: Secondary | ICD-10-CM | POA: Diagnosis present

## 2020-02-06 DIAGNOSIS — R262 Difficulty in walking, not elsewhere classified: Secondary | ICD-10-CM | POA: Diagnosis not present

## 2020-02-06 DIAGNOSIS — E785 Hyperlipidemia, unspecified: Secondary | ICD-10-CM | POA: Diagnosis not present

## 2020-02-06 DIAGNOSIS — Z7901 Long term (current) use of anticoagulants: Secondary | ICD-10-CM | POA: Diagnosis not present

## 2020-02-06 DIAGNOSIS — S80211A Abrasion, right knee, initial encounter: Secondary | ICD-10-CM | POA: Diagnosis present

## 2020-02-06 DIAGNOSIS — M2550 Pain in unspecified joint: Secondary | ICD-10-CM | POA: Diagnosis not present

## 2020-02-06 DIAGNOSIS — Z8674 Personal history of sudden cardiac arrest: Secondary | ICD-10-CM | POA: Diagnosis not present

## 2020-02-06 DIAGNOSIS — Y9301 Activity, walking, marching and hiking: Secondary | ICD-10-CM | POA: Diagnosis present

## 2020-02-06 HISTORY — PX: CARDIOVERSION: SHX1299

## 2020-02-06 LAB — BASIC METABOLIC PANEL
Anion gap: 14 (ref 5–15)
BUN: 14 mg/dL (ref 8–23)
CO2: 21 mmol/L — ABNORMAL LOW (ref 22–32)
Calcium: 9 mg/dL (ref 8.9–10.3)
Chloride: 97 mmol/L — ABNORMAL LOW (ref 98–111)
Creatinine, Ser: 1.26 mg/dL — ABNORMAL HIGH (ref 0.61–1.24)
GFR calc Af Amer: >60 mL/min (ref >60)
GFR calc non Af Amer: 54 mL/min — ABNORMAL LOW (ref >60)
Glucose, Bld: 132 mg/dL — ABNORMAL HIGH (ref 70–99)
Potassium: 3.8 mmol/L (ref 3.5–5.1)
Sodium: 132 mmol/L — ABNORMAL LOW (ref 135–145)

## 2020-02-06 LAB — ECHOCARDIOGRAM COMPLETE
Area-P 1/2: 4.33 cm2
Calc EF: 60.3 %
Height: 72 in
MV M vel: 5.34 m/s
MV Peak grad: 114.1 mmHg
Radius: 0.4 cm
S' Lateral: 3.3 cm
Single Plane A2C EF: 60.9 %
Single Plane A4C EF: 61.5 %
Weight: 3696 oz

## 2020-02-06 LAB — COMPREHENSIVE METABOLIC PANEL
ALT: 36 U/L (ref 0–44)
AST: 66 U/L — ABNORMAL HIGH (ref 15–41)
Albumin: 3.1 g/dL — ABNORMAL LOW (ref 3.5–5.0)
Alkaline Phosphatase: 98 U/L (ref 38–126)
Anion gap: 13 (ref 5–15)
BUN: 15 mg/dL (ref 8–23)
CO2: 22 mmol/L (ref 22–32)
Calcium: 8.5 mg/dL — ABNORMAL LOW (ref 8.9–10.3)
Chloride: 96 mmol/L — ABNORMAL LOW (ref 98–111)
Creatinine, Ser: 1.24 mg/dL (ref 0.61–1.24)
GFR calc Af Amer: >60 mL/min (ref >60)
GFR calc non Af Amer: 55 mL/min — ABNORMAL LOW (ref >60)
Glucose, Bld: 125 mg/dL — ABNORMAL HIGH (ref 70–99)
Potassium: 3.2 mmol/L — ABNORMAL LOW (ref 3.5–5.1)
Sodium: 131 mmol/L — ABNORMAL LOW (ref 135–145)
Total Bilirubin: 1.7 mg/dL — ABNORMAL HIGH (ref 0.3–1.2)
Total Protein: 5.4 g/dL — ABNORMAL LOW (ref 6.5–8.1)

## 2020-02-06 LAB — URINALYSIS, COMPLETE (UACMP) WITH MICROSCOPIC
Bacteria, UA: NONE SEEN
Bilirubin Urine: NEGATIVE
Glucose, UA: NEGATIVE mg/dL
Ketones, ur: 5 mg/dL — AB
Leukocytes,Ua: NEGATIVE
Nitrite: NEGATIVE
Protein, ur: NEGATIVE mg/dL
Specific Gravity, Urine: 1.013 (ref 1.005–1.030)
pH: 5 (ref 5.0–8.0)

## 2020-02-06 LAB — TROPONIN I (HIGH SENSITIVITY): Troponin I (High Sensitivity): 20 ng/L — ABNORMAL HIGH (ref <18)

## 2020-02-06 LAB — TSH: TSH: 1.764 u[IU]/mL (ref 0.350–4.500)

## 2020-02-06 LAB — BRAIN NATRIURETIC PEPTIDE: B Natriuretic Peptide: 381.9 pg/mL — ABNORMAL HIGH (ref 0.0–100.0)

## 2020-02-06 SURGERY — CARDIOVERSION
Anesthesia: General

## 2020-02-06 MED ORDER — LIDOCAINE HCL (CARDIAC) PF 100 MG/5ML IV SOSY
PREFILLED_SYRINGE | INTRAVENOUS | Status: DC | PRN
  Administered 2020-02-06: 60 mg via INTRAVENOUS

## 2020-02-06 MED ORDER — INFLUENZA VAC A&B SA ADJ QUAD 0.5 ML IM PRSY
0.5000 mL | PREFILLED_SYRINGE | INTRAMUSCULAR | Status: AC
  Administered 2020-02-07: 0.5 mL via INTRAMUSCULAR
  Filled 2020-02-06: qty 0.5

## 2020-02-06 MED ORDER — SODIUM CHLORIDE 0.9 % IV BOLUS
1000.0000 mL | Freq: Once | INTRAVENOUS | Status: AC
  Administered 2020-02-06: 1000 mL via INTRAVENOUS

## 2020-02-06 MED ORDER — POTASSIUM CHLORIDE CRYS ER 20 MEQ PO TBCR
40.0000 meq | EXTENDED_RELEASE_TABLET | ORAL | Status: AC
  Administered 2020-02-06 (×2): 40 meq via ORAL
  Filled 2020-02-06 (×2): qty 2

## 2020-02-06 MED ORDER — PROPOFOL 10 MG/ML IV BOLUS
INTRAVENOUS | Status: DC | PRN
  Administered 2020-02-06: 70 mg via INTRAVENOUS

## 2020-02-06 MED ORDER — MAGNESIUM SULFATE 2 GM/50ML IV SOLN
2.0000 g | Freq: Once | INTRAVENOUS | Status: AC
  Administered 2020-02-06: 2 g via INTRAVENOUS
  Filled 2020-02-06: qty 50

## 2020-02-06 MED ORDER — SODIUM CHLORIDE 0.9 % IV SOLN: INTRAVENOUS | Status: DC | PRN

## 2020-02-06 MED ORDER — MUPIROCIN CALCIUM 2 % EX CREA
TOPICAL_CREAM | Freq: Two times a day (BID) | CUTANEOUS | Status: DC
  Administered 2020-02-06 – 2020-02-07 (×2): 1 via TOPICAL
  Filled 2020-02-06: qty 15

## 2020-02-06 NOTE — CV Procedure (Signed)
   Electrical Cardioversion Procedure Note YACOUB DILTZ [id number] December 02, 1940  Procedure: Electrical Cardioversion Indications:  Atrial Fibrillation  Time Out: Verified patient identification, verified procedure,medications/allergies/relevent history reviewed, required imaging and test results available.  Performed  Procedure Details  The patient was NPO after midnight. Anesthesia was administered at the beside  by Dr.Stoltzfus with mg of propofol.  Cardioversion was done with synchronized biphasic defibrillation with AP pads with 200 J.  The patient converted to normal sinus rhythm. The patient tolerated the procedure well, thought had a brief run of atrial fibrillation that spontaneously converted.  IMPRESSION:  Successful cardioversion of atrial fibrillation    Ashey Tramontana A Dareon Nunziato 02/06/2020, 1:05 PM

## 2020-02-06 NOTE — Anesthesia Postprocedure Evaluation (Signed)
Anesthesia Post Note  Patient: Darryl Diaz  Procedure(s) Performed: CARDIOVERSION (N/A )     Patient location during evaluation: Endoscopy Anesthesia Type: General Level of consciousness: awake and alert Pain management: pain level controlled Vital Signs Assessment: post-procedure vital signs reviewed and stable Respiratory status: spontaneous breathing, nonlabored ventilation, respiratory function stable and patient connected to nasal cannula oxygen Cardiovascular status: blood pressure returned to baseline and stable Postop Assessment: no apparent nausea or vomiting Anesthetic complications: no   No complications documented.  Last Vitals:  Vitals:   02/06/20 1325 02/06/20 1330  BP: 130/63 123/71  Pulse: 65 65  Resp: 19 15  Temp:    SpO2: 100% 98%    Last Pain:  Vitals:   02/06/20 1330  TempSrc:   PainSc: 0-No pain                 Belenda Cruise P Tishawna Larouche

## 2020-02-06 NOTE — CV Procedure (Signed)
2D echo attempted, but pt was in bathroom. Will try later. Called to another pt by cardiology

## 2020-02-06 NOTE — H&P (View-Only) (Signed)
Cardiology Progress Note  Patient ID: Darryl Diaz MRN: [id number] DOB: 07/12/1940 Date of Encounter: 02/06/2020  Primary Cardiologist: Sherren Mocha, MD  Subjective   Chief Complaint: Complains of weakness.  HPI: Admitted after 5 days of weakness.  Found to have A. fib with RVR.  Appears comfortable today.  Plans for cardioversion.  ROS:  All other ROS reviewed and negative. Pertinent positives noted in the HPI.     Inpatient Medications  Scheduled Meds: . apixaban  5 mg Oral BID  . atorvastatin  20 mg Oral Daily  . docusate sodium  100 mg Oral BID  . escitalopram  10 mg Oral Daily  . ferrous sulfate  325 mg Oral TID WC  . folic acid  1 mg Oral Daily  . loratadine  10 mg Oral Daily  . metoprolol succinate  75 mg Oral Daily  . multivitamin with minerals  1 tablet Oral Daily  . pantoprazole  40 mg Oral Daily  . polyethylene glycol  17 g Oral BID  . potassium chloride  40 mEq Oral Q4H  . sodium chloride flush  3 mL Intravenous Q12H  . tamsulosin  0.4 mg Oral QHS  . vitamin B-1  250 mg Oral Daily  . traZODone  50 mg Oral QHS   Continuous Infusions: . sodium chloride    . diltiazem (CARDIZEM) infusion 15 mg/hr (02/06/20 0148)   PRN Meds: sodium chloride, acetaminophen, ALPRAZolam, HYDROcodone-acetaminophen, methocarbamol, metoprolol tartrate, nitroGLYCERIN, ondansetron (ZOFRAN) IV, sodium chloride flush, zolpidem   Vital Signs   Vitals:   02/06/20 0645 02/06/20 0702 02/06/20 0715 02/06/20 0748  BP: 106/67 (!) 115/59 117/73 119/81  Pulse: 89 (!) 101 98 (!) 105  Resp:    (!) 23  Temp:      TempSrc:      SpO2: 97% 92% 95% 99%  Weight:      Height:        Intake/Output Summary (Last 24 hours) at 02/06/2020 0809 Last data filed at 02/06/2020 0147 Gross per 24 hour  Intake 562.3 ml  Output --  Net 562.3 ml   Last 3 Weights 02/05/2020 10/25/2019 10/15/2019  Weight (lbs) 231 lb 236 lb 6.4 oz 231 lb 6 oz  Weight (kg) 104.781 kg 107.23 kg 104.951 kg      Telemetry   Overnight telemetry shows A. fib with RVR with heart rates in the 100-120 range, which I personally reviewed.   ECG  The most recent ECG shows atrial fibrillation, heart rate 115, old anterior infarct, nonspecific ST-T changes, which I personally reviewed.   Physical Exam   Vitals:   02/06/20 0645 02/06/20 0702 02/06/20 0715 02/06/20 0748  BP: 106/67 (!) 115/59 117/73 119/81  Pulse: 89 (!) 101 98 (!) 105  Resp:    (!) 23  Temp:      TempSrc:      SpO2: 97% 92% 95% 99%  Weight:      Height:         Intake/Output Summary (Last 24 hours) at 02/06/2020 0809 Last data filed at 02/06/2020 0147 Gross per 24 hour  Intake 562.3 ml  Output --  Net 562.3 ml    Last 3 Weights 02/05/2020 10/25/2019 10/15/2019  Weight (lbs) 231 lb 236 lb 6.4 oz 231 lb 6 oz  Weight (kg) 104.781 kg 107.23 kg 104.951 kg    Body mass index is 31.33 kg/m.  General: Well nourished, well developed, in no acute distress Head: Atraumatic, normal size  Eyes: PEERLA, EOMI  Neck:  Supple, no JVD Endocrine: No thryomegaly Cardiac: Normal S1, S2; irregular rhythm, no murmurs rubs or gallops Lungs: Clear to auscultation bilaterally, no wheezing, rhonchi or rales  Abd: Soft, nontender, no hepatomegaly  Ext: No edema, pulses 2+ Musculoskeletal: No deformities, BUE and BLE strength normal and equal Skin: Warm and dry, no rashes   Neuro: Alert and oriented to person, place, time, and situation, CNII-XII grossly intact, no focal deficits  Psych: Normal mood and affect   Labs  High Sensitivity Troponin:  No results for input(s): TROPONINIHS in the last 720 hours.   Cardiac EnzymesNo results for input(s): TROPONINI in the last 168 hours. No results for input(s): TROPIPOC in the last 168 hours.  Chemistry Recent Labs  Lab 02/05/20 1138 02/05/20 1138 02/05/20 1909 02/05/20 2129 02/06/20 0304  NA 128*  --  130*  --  131*  K 4.0   < > 2.7* 2.7* 3.2*  CL 92*  --  95*  --  96*  CO2 18*  --  21*  --  22  GLUCOSE 105*  --   155*  --  125*  BUN 17  --  16  --  15  CREATININE 1.32*  --  1.31*  --  1.24  CALCIUM 8.7*  --  8.6*  --  8.5*  PROT  --   --   --   --  5.4*  ALBUMIN  --   --   --   --  3.1*  AST  --   --   --   --  66*  ALT  --   --   --   --  36  ALKPHOS  --   --   --   --  98  BILITOT  --   --   --   --  1.7*  GFRNONAA 51*  --  52*  --  55*  GFRAA 59*  --  >60  --  >60  ANIONGAP 18*  --  14  --  13   < > = values in this interval not displayed.    Hematology Recent Labs  Lab 02/05/20 1439  WBC 7.1  RBC 3.16*  HGB 10.0*  HCT 29.8*  MCV 94.3  MCH 31.6  MCHC 33.6  RDW 15.0  PLT 116*   BNPNo results for input(s): BNP, PROBNP in the last 168 hours.  DDimer No results for input(s): DDIMER in the last 168 hours.   Radiology  CT Head Wo Contrast  Result Date: 02/05/2020 CLINICAL DATA:  Head trauma.  Fall, dizzy on anticoagulation. EXAM: CT HEAD WITHOUT CONTRAST TECHNIQUE: Contiguous axial images were obtained from the base of the skull through the vertex without intravenous contrast. COMPARISON:  02/25/2019 FINDINGS: Brain: No evidence of acute large vascular territory infarction, hemorrhage, hydrocephalus, extra-axial collection or mass lesion/mass effect. Similar scattered white matter hypoattenuation, compatible with chronic microvascular ischemic disease. Similar diffuse cerebral volume loss with ex vacuo ventricular dilation. Vascular: Calcific intracranial atherosclerosis. Skull: Normal. Negative for fracture or focal lesion. Sinuses/Orbits: Mild scattered ethmoid air cell mucosal thickening and trace inferior left maxillary sinus mucosal thickening. Otherwise, the sinuses are clear. No evidence of acute orbital abnormality. Other: Underpneumatized left mastoid air cells with chronic trace left mastoid effusion. IMPRESSION: 1. No evidence of acute intracranial abnormality. 2. Similar chronic microvascular ischemic disease and generalized cerebral atrophy. Electronically Signed   By: Margaretha Sheffield MD   On: 02/05/2020 14:56   CT Cervical Spine Wo Contrast  Result  Date: 02/05/2020 CLINICAL DATA:  Neck trauma.  Fall. EXAM: CT CERVICAL SPINE WITHOUT CONTRAST TECHNIQUE: Multidetector CT imaging of the cervical spine was performed without intravenous contrast. Multiplanar CT image reconstructions were also generated. COMPARISON:  None. FINDINGS: Alignment: Mild anterolisthesis of C4 on C5, favor degenerative given severe facet degenerative change at this level. Reversal of the normal cervical lordosis, favored positional/degenerative in etiology. Otherwise, no substantial subluxation. Skull base and vertebrae: No acute fracture. Vertebral body heights are maintained. No primary bone lesion or focal pathologic process. Soft tissues and spinal canal: No prevertebral fluid or swelling. No visible large canal hematoma. Disc levels: Ankylosis across the left C3-C4 and right C5-C6 facet joints. 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 Upper chest: Negative. Please see separately dictated CT of the head for intracranial valuation. IMPRESSION: 1. No evidence of acute fracture or traumatic malalignment. 2. 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 Electronically Signed   By: Margaretha Sheffield MD   On: 02/05/2020 15:04   DG Chest Port 1 View  Result Date: 02/05/2020 CLINICAL DATA:  Atrial fibrillation, presenting after mechanical fall. EXAM: PORTABLE CHEST 1 VIEW COMPARISON:  Comparison chest x-ray 04/22/2019 FINDINGS: The heart size and mediastinal contours are sign unchanged. No focal consolidation. No pulmonary edema. No pleural effusion. No pneumothorax. No acute displaced fracture. IMPRESSION: No active disease. Electronically Signed   By: Iven Finn M.D.   On: 02/05/2020 17:16    Cardiac Studies  TTE 04/21/2019  1.  The left ventricle has no regional wall motion abnormalities.  2. LVEF is 60-65%.  3. Left atrial size was severely dilated.  4. The mitral valve was not assessed. not assessed mitral valve  regurgitation. No evidence of mitral stenosis.  5. The tricuspid valve is normal in structure. Tricuspid valve  regurgitation is trivial.  6. The pulmonic valve was not well visualized. Pulmonic valve  regurgitation not assessed.  7. Limited echo s/p cardiac arrest  8. Not assessed.  9. The aortic valve was not assessed. Aortic valve regurgitation not  assessed.  10. Left ventricular ejection fraction, by visual estimation, is 60 to  65%. The left ventricle has normal function. There is not assseded.  11. Global right ventricle has normal systolic function.The right  ventricular size is not assessed. Right vetricular wall thickness was not  assessed.   Patient Profile  Darryl Diaz is a 79 y.o. 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  Assessment & Plan   1.  Atrial fibrillation with RVR, now persistent -Long history of paroxysmal A. fib on Eliquis.  No missed doses in the last 3 weeks.  Unclear what the trigger was.  He reports has been weak for about 5 days.  Possibly he developed A. fib and then has noticed some weakness with this.  He reports he had a mechanical fall while bending forward yesterday.  He describes no syncope.  He describes no dizziness.  He says he just got lightheaded.  Trauma work-up negative.  Electrolytes are deranged.  Unclear if this is contributing.  He reports no infectious symptoms.  No fevers chills, shortness  of breath or cough.  He describes no  dysuria.  The only thing to explain his weakness at this point is A. Fib. -We will continue his diltiazem drip for now.  Continue Eliquis.  Plans for cardioversion later today.  Keep n.p.o. except for meds. -History of bradycardic arrest in the setting of being on metoprolol, diltiazem and amiodarone.  He has been on metoprolol judiciously due to this.  We will continue to watch him closely while in the hospital. -Hemoglobin is stable.  No signs of recurrent GI bleed. -Repeat echocardiogram. -Trend troponins and check BMP.  2.  Hypokalemia/hypomagnesemia -Unclear why his potassium magnesium are still low.  He reports no nausea or vomiting.  He reports no infectious symptoms.  Chest x-ray is negative. -Covid negative.  Flu negative. -I have ordered replacement.  We will recheck a level before his cardioversion.  3.  Weakness/fatigue/mechanical fall -Back in A. fib.  Electrolytes are quite abnormal.  Unclear etiology see above. -I will check a UA and urine culture. -TSH is normal. -No real infectious symptoms.  Chest x-ray clear. -CT head negative -CT neck without fracture.  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  He has no symptoms of neuropathy.  We will keep an eye on this. -EKG without ischemic changes.  I will check a troponin and BNP.  Echo is pending. -He could just be dehydrated and this triggered his A. Fib.  He does appear volume down.  I will give him some fluids back today. -He reports he lives alone.  Is been weak for 5 days.  I suspect he will need PT OT after his cardioversion.  4.  Hypertension -Stable.  Home meds.  5.  Prior GI bleed -Hemoglobin stable.  No concerns for bleeding.  FEN -Intravenous fluids: 1 L bolus -DVT PPx: Eliquis -Diet: N.p.o. for cardioversion today -Code: Full  For questions or updates, please contact [place] Please consult [email or web address] for contact info under   Time Spent with Patient: I have  spent a total of 25 minutes with patient reviewing hospital notes, telemetry, EKGs, labs and examining the patient as well as establishing an assessment and plan that was discussed with the patient.  > 50% of time was spent in direct patient care.    Signed, Addison Naegeli. Audie Box, [place]  02/06/2020 8:09 AM

## 2020-02-06 NOTE — Anesthesia Preprocedure Evaluation (Addendum)
Anesthesia Evaluation  Patient identified by MRN, date of birth, ID band Patient awake    Reviewed: Allergy & Precautions, NPO status , Patient's Chart, lab work & pertinent test results  Airway Mallampati: III  TM Distance: >3 FB Neck ROM: Full    Dental  (+) Dental Advisory Given, Teeth Intact   Pulmonary sleep apnea , former smoker,    Pulmonary exam normal breath sounds clear to auscultation       Cardiovascular hypertension, Pt. on medications + Past MI  + dysrhythmias Atrial Fibrillation  Rhythm:Irregular Rate:Tachycardia  Echo 04/2019  1. The left ventricle has no regional wall motion abnormalities.  2. LVEF is 60-65%.  3. Left atrial size was severely dilated.  4. The mitral valve was not assessed. not assessed mitral valve regurgitation. No evidence of mitral stenosis.  5. The tricuspid valve is normal in structure. Tricuspid valve regurgitation is trivial.  6. The pulmonic valve was not well visualized. Pulmonic valve regurgitation not assessed.  7. Limited echo s/p cardiac arrest  8. Not assessed.  9. The aortic valve was not assessed. Aortic valve regurgitation not assessed.  10. Left ventricular ejection fraction, by visual estimation, is 60 to 65%. The left ventricle has normal function. There is not assseded.  11. Global right ventricle has normal systolic function.The right ventricular size is not assessed. Right vetricular wall thickness was not assessed.    Neuro/Psych PSYCHIATRIC DISORDERS Depression negative neurological ROS     GI/Hepatic GERD  Medicated,GIB 2/2 mallory weiss tear   Endo/Other    Renal/GU negative Renal ROS     Musculoskeletal  (+) Arthritis , Osteoarthritis,    Abdominal (+)  Abdomen: soft. Bowel sounds: normal.  Peds  Hematology  (+) anemia ,   Anesthesia Other Findings   Reproductive/Obstetrics                           Anesthesia  Physical Anesthesia Plan  ASA: III  Anesthesia Plan: General   Post-op Pain Management:    Induction: Intravenous  PONV Risk Score and Plan: 2 and Ondansetron and Treatment may vary due to age or medical condition  Airway Management Planned: Mask  Additional Equipment: None  Intra-op Plan:   Post-operative Plan:   Informed Consent: I have reviewed the patients History and Physical, chart, labs and discussed the procedure including the risks, benefits and alternatives for the proposed anesthesia with the patient or authorized representative who has indicated his/her understanding and acceptance.     Dental advisory given  Plan Discussed with: CRNA  Anesthesia Plan Comments: (ECHO 12/20:  1. The left ventricle has no regional wall motion abnormalities.  2. LVEF is 60-65%.  3. Left atrial size was severely dilated.  4. The mitral valve was not assessed. not assessed mitral valve  regurgitation. No evidence of mitral stenosis.  5. The tricuspid valve is normal in structure. Tricuspid valve  regurgitation is trivial.  6. The pulmonic valve was not well visualized. Pulmonic valve  regurgitation not assessed.  7. Limited echo s/p cardiac arrest  8. Not assessed.  9. The aortic valve was not assessed. Aortic valve regurgitation not  assessed.  10. Left ventricular ejection fraction, by visual estimation, is 60 to  65%. The left ventricle has normal function. There is not assseded.  11. Global right ventricle has normal systolic function.The right  ventricular size is not assessed. Right vetricular wall thickness was not  assessed. )  Anesthesia Quick Evaluation  

## 2020-02-06 NOTE — ED Notes (Signed)
Pt assisted to BR with walker and 1 person assist with wobble unsteady gait. Upon returning to bed WC was used to d/t dyspnea on exertion and increased wob. PT returned to bed, gown changed, and monitor resumed

## 2020-02-06 NOTE — Transfer of Care (Signed)
Immediate Anesthesia Transfer of Care Note  Patient: Darryl Diaz  Procedure(s) Performed: CARDIOVERSION (N/A )  Patient Location: Endoscopy Unit  Anesthesia Type:General  Level of Consciousness: awake, alert  and oriented  Airway & Oxygen Therapy: Patient Spontanous Breathing  Post-op Assessment: Report given to RN, Post -op Vital signs reviewed and stable and Patient moving all extremities X 4  Post vital signs: Reviewed and stable  Last Vitals:  Vitals Value Taken Time  BP    Temp    Pulse    Resp    SpO2      Last Pain:  Vitals:   02/06/20 1158  TempSrc: Oral  PainSc: 0-No pain         Complications: No complications documented.

## 2020-02-06 NOTE — Progress Notes (Signed)
  Echocardiogram 2D Echocardiogram has been performed.  Darryl Diaz 02/06/2020, 11:19 AM

## 2020-02-06 NOTE — Interval H&P Note (Signed)
History and Physical Interval Note:  02/06/2020 12:49 PM  [name]  has presented today for surgery, with the diagnosis of atrial fib.  The various methods of treatment have been discussed with the patient and family. After consideration of risks, benefits and other options for treatment, the patient has consented to  Procedure(s): CARDIOVERSION (N/A) as a surgical intervention.  The patient's history has been reviewed, patient examined, no change in status, stable for surgery.  I have reviewed the patient's chart and labs.  Questions were answered to the patient's satisfaction.     Paulene Tayag A Tita Terhaar

## 2020-02-06 NOTE — Consult Note (Signed)
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 Scabs to right great toe, left second and little toes.  Dressing procedure/placement/frequency: Thoroughly clean both feet with soap and water, rinse and pat dry. Apply bactroban to right great toe, second and little toe of the left foot, apply band aids to the toes then place both feet in Prevalon boots.  Clean buttocks and groin area thoroughly with soap and water, rinse and pat dry. Cover groin area with antifungal powder (in clean supply room). Apply generous amount of Critic-Aid barrier moisture (Purple and white tube in clean supply room) to buttocks and leave open to air. Repeat this process every shift and PRN as needed.   Thank you for the consult. [facility] nurse will not follow at this time.   Please re-consult the [facility] team if needed.  Cathlean Marseilles Tamala Julian, MSN, RN, [place], Lysle Pearl, [facility] Doctor [facility] Wound Treatment Associate Pager [phone]

## 2020-02-06 NOTE — Progress Notes (Signed)
Cardiology Progress Note  Patient ID: Darryl Diaz MRN: [id number] DOB: Feb 10, 1941 Date of Encounter: 02/06/2020  Primary Cardiologist: Sherren Mocha, MD  Subjective   Chief Complaint: Complains of weakness.  HPI: Admitted after 5 days of weakness.  Found to have A. fib with RVR.  Appears comfortable today.  Plans for cardioversion.  ROS:  All other ROS reviewed and negative. Pertinent positives noted in the HPI.     Inpatient Medications  Scheduled Meds: . apixaban  5 mg Oral BID  . atorvastatin  20 mg Oral Daily  . docusate sodium  100 mg Oral BID  . escitalopram  10 mg Oral Daily  . ferrous sulfate  325 mg Oral TID WC  . folic acid  1 mg Oral Daily  . loratadine  10 mg Oral Daily  . metoprolol succinate  75 mg Oral Daily  . multivitamin with minerals  1 tablet Oral Daily  . pantoprazole  40 mg Oral Daily  . polyethylene glycol  17 g Oral BID  . potassium chloride  40 mEq Oral Q4H  . sodium chloride flush  3 mL Intravenous Q12H  . tamsulosin  0.4 mg Oral QHS  . vitamin B-1  250 mg Oral Daily  . traZODone  50 mg Oral QHS   Continuous Infusions: . sodium chloride    . diltiazem (CARDIZEM) infusion 15 mg/hr (02/06/20 0148)   PRN Meds: sodium chloride, acetaminophen, ALPRAZolam, HYDROcodone-acetaminophen, methocarbamol, metoprolol tartrate, nitroGLYCERIN, ondansetron (ZOFRAN) IV, sodium chloride flush, zolpidem   Vital Signs   Vitals:   02/06/20 0645 02/06/20 0702 02/06/20 0715 02/06/20 0748  BP: 106/67 (!) 115/59 117/73 119/81  Pulse: 89 (!) 101 98 (!) 105  Resp:    (!) 23  Temp:      TempSrc:      SpO2: 97% 92% 95% 99%  Weight:      Height:        Intake/Output Summary (Last 24 hours) at 02/06/2020 0809 Last data filed at 02/06/2020 0147 Gross per 24 hour  Intake 562.3 ml  Output -  Net 562.3 ml   Last 3 Weights 02/05/2020 10/25/2019 10/15/2019  Weight (lbs) 231 lb 236 lb 6.4 oz 231 lb 6 oz  Weight (kg) 104.781 kg 107.23 kg 104.951 kg      Telemetry   Overnight telemetry shows A. fib with RVR with heart rates in the 100-120 range, which I personally reviewed.   ECG  The most recent ECG shows atrial fibrillation, heart rate 115, old anterior infarct, nonspecific ST-T changes, which I personally reviewed.   Physical Exam   Vitals:   02/06/20 0645 02/06/20 0702 02/06/20 0715 02/06/20 0748  BP: 106/67 (!) 115/59 117/73 119/81  Pulse: 89 (!) 101 98 (!) 105  Resp:    (!) 23  Temp:      TempSrc:      SpO2: 97% 92% 95% 99%  Weight:      Height:         Intake/Output Summary (Last 24 hours) at 02/06/2020 0809 Last data filed at 02/06/2020 0147 Gross per 24 hour  Intake 562.3 ml  Output -  Net 562.3 ml    Last 3 Weights 02/05/2020 10/25/2019 10/15/2019  Weight (lbs) 231 lb 236 lb 6.4 oz 231 lb 6 oz  Weight (kg) 104.781 kg 107.23 kg 104.951 kg    Body mass index is 31.33 kg/m.  General: Well nourished, well developed, in no acute distress Head: Atraumatic, normal size  Eyes: PEERLA, EOMI  Neck:  Supple, no JVD Endocrine: No thryomegaly Cardiac: Normal S1, S2; irregular rhythm, no murmurs rubs or gallops Lungs: Clear to auscultation bilaterally, no wheezing, rhonchi or rales  Abd: Soft, nontender, no hepatomegaly  Ext: No edema, pulses 2+ Musculoskeletal: No deformities, BUE and BLE strength normal and equal Skin: Warm and dry, no rashes   Neuro: Alert and oriented to person, place, time, and situation, CNII-XII grossly intact, no focal deficits  Psych: Normal mood and affect   Labs  High Sensitivity Troponin:  No results for input(s): TROPONINIHS in the last 720 hours.   Cardiac EnzymesNo results for input(s): TROPONINI in the last 168 hours. No results for input(s): TROPIPOC in the last 168 hours.  Chemistry Recent Labs  Lab 02/05/20 1138 02/05/20 1138 02/05/20 1909 02/05/20 2129 02/06/20 0304  NA 128*  --  130*  --  131*  K 4.0   < > 2.7* 2.7* 3.2*  CL 92*  --  95*  --  96*  CO2 18*  --  21*  --  22  GLUCOSE 105*  --   155*  --  125*  BUN 17  --  16  --  15  CREATININE 1.32*  --  1.31*  --  1.24  CALCIUM 8.7*  --  8.6*  --  8.5*  PROT  --   --   --   --  5.4*  ALBUMIN  --   --   --   --  3.1*  AST  --   --   --   --  66*  ALT  --   --   --   --  36  ALKPHOS  --   --   --   --  98  BILITOT  --   --   --   --  1.7*  GFRNONAA 51*  --  52*  --  55*  GFRAA 59*  --  >60  --  >60  ANIONGAP 18*  --  14  --  13   < > = values in this interval not displayed.    Hematology Recent Labs  Lab 02/05/20 1439  WBC 7.1  RBC 3.16*  HGB 10.0*  HCT 29.8*  MCV 94.3  MCH 31.6  MCHC 33.6  RDW 15.0  PLT 116*   BNPNo results for input(s): BNP, PROBNP in the last 168 hours.  DDimer No results for input(s): DDIMER in the last 168 hours.   Radiology  CT Head Wo Contrast  Result Date: 02/05/2020 CLINICAL DATA:  Head trauma.  Fall, dizzy on anticoagulation. EXAM: CT HEAD WITHOUT CONTRAST TECHNIQUE: Contiguous axial images were obtained from the base of the skull through the vertex without intravenous contrast. COMPARISON:  02/25/2019 FINDINGS: Brain: No evidence of acute large vascular territory infarction, hemorrhage, hydrocephalus, extra-axial collection or mass lesion/mass effect. Similar scattered white matter hypoattenuation, compatible with chronic microvascular ischemic disease. Similar diffuse cerebral volume loss with ex vacuo ventricular dilation. Vascular: Calcific intracranial atherosclerosis. Skull: Normal. Negative for fracture or focal lesion. Sinuses/Orbits: Mild scattered ethmoid air cell mucosal thickening and trace inferior left maxillary sinus mucosal thickening. Otherwise, the sinuses are clear. No evidence of acute orbital abnormality. Other: Underpneumatized left mastoid air cells with chronic trace left mastoid effusion. IMPRESSION: 1. No evidence of acute intracranial abnormality. 2. Similar chronic microvascular ischemic disease and generalized cerebral atrophy. Electronically Signed   By: Margaretha Sheffield MD   On: 02/05/2020 14:56   CT Cervical Spine Wo Contrast  Result  Date: 02/05/2020 CLINICAL DATA:  Neck trauma.  Fall. EXAM: CT CERVICAL SPINE WITHOUT CONTRAST TECHNIQUE: Multidetector CT imaging of the cervical spine was performed without intravenous contrast. Multiplanar CT image reconstructions were also generated. COMPARISON:  None. FINDINGS: Alignment: Mild anterolisthesis of C4 on C5, favor degenerative given Diaz facet degenerative change at this level. Reversal of the normal cervical lordosis, favored positional/degenerative in etiology. Otherwise, no substantial subluxation. Skull base and vertebrae: No acute fracture. Vertebral body heights are maintained. No primary bone lesion or focal pathologic process. Soft tissues and spinal canal: No prevertebral fluid or swelling. No visible large canal hematoma. Disc levels: Ankylosis across the left C3-C4 and right C5-C6 facet joints. Bulky facet hypertrophy multiple levels with likely Diaz left foraminal stenosis at C3-C4 and at least moderate foraminal stenosis at other levels. Upper chest: Negative. Please see separately dictated CT of the head for intracranial valuation. IMPRESSION: 1. No evidence of acute fracture or traumatic malalignment. 2. 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 Electronically Signed   By: Margaretha Sheffield MD   On: 02/05/2020 15:04   DG Chest Port 1 View  Result Date: 02/05/2020 CLINICAL DATA:  Atrial fibrillation, presenting after mechanical fall. EXAM: PORTABLE CHEST 1 VIEW COMPARISON:  Comparison chest x-ray 04/22/2019 FINDINGS: The heart size and mediastinal contours are sign unchanged. No focal consolidation. No pulmonary edema. No pleural effusion. No pneumothorax. No acute displaced fracture. IMPRESSION: No active disease. Electronically Signed   By: Iven Finn M.D.   On: 02/05/2020 17:16    Cardiac Studies  TTE 04/21/2019  1.  The left ventricle has no regional wall motion abnormalities.  2. LVEF is 60-65%.  3. Left atrial size was severely dilated.  4. The mitral valve was not assessed. not assessed mitral valve  regurgitation. No evidence of mitral stenosis.  5. The tricuspid valve is normal in structure. Tricuspid valve  regurgitation is trivial.  6. The pulmonic valve was not well visualized. Pulmonic valve  regurgitation not assessed.  7. Limited echo s/p cardiac arrest  8. Not assessed.  9. The aortic valve was not assessed. Aortic valve regurgitation not  assessed.  10. Left ventricular ejection fraction, by visual estimation, is 60 to  65%. The left ventricle has normal function. There is not assseded.  11. Global right ventricle has normal systolic function.The right  ventricular size is not assessed. Right vetricular wall thickness was not  assessed.   Patient Profile  Darryl Diaz is a 79 y.o. 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  Assessment & Plan   1.  Atrial fibrillation with RVR, now persistent -Long history of paroxysmal A. fib on Eliquis.  No missed doses in the last 3 weeks.  Unclear what the trigger was.  He reports has been weak for about 5 days.  Possibly he developed A. fib and then has noticed some weakness with this.  He reports he had a mechanical fall while bending forward yesterday.  He describes no syncope.  He describes no dizziness.  He says he just got lightheaded.  Trauma work-up negative.  Electrolytes are deranged.  Unclear if this is contributing.  He reports no infectious symptoms.  No fevers chills, shortness  of breath or cough.  He describes no  dysuria.  The only thing to explain his weakness at this point is A. Fib. -We will continue his diltiazem drip for now.  Continue Eliquis.  Plans for cardioversion later today.  Keep n.p.o. except for meds. -History of bradycardic arrest in the setting of being on metoprolol, diltiazem and amiodarone.  He has been on metoprolol judiciously due to this.  We will continue to watch him closely while in the hospital. -Hemoglobin is stable.  No signs of recurrent GI bleed. -Repeat echocardiogram. -Trend troponins and check BMP.  2.  Hypokalemia/hypomagnesemia -Unclear why his potassium magnesium are still low.  He reports no nausea or vomiting.  He reports no infectious symptoms.  Chest x-ray is negative. -Covid negative.  Flu negative. -I have ordered replacement.  We will recheck a level before his cardioversion.  3.  Weakness/fatigue/mechanical fall -Back in A. fib.  Electrolytes are quite abnormal.  Unclear etiology see above. -I will check a UA and urine culture. -TSH is normal. -No real infectious symptoms.  Chest x-ray clear. -CT head negative -CT neck without fracture.  Noted to have degenerative changes and Diaz stenosis at the level of C3-C4.  He has no symptoms of neuropathy.  We will keep an eye on this. -EKG without ischemic changes.  I will check a troponin and BNP.  Echo is pending. -He could just be dehydrated and this triggered his A. Fib.  He does appear volume down.  I will give him some fluids back today. -He reports he lives alone.  Is been weak for 5 days.  I suspect he will need PT OT after his cardioversion.  4.  Hypertension -Stable.  Home meds.  5.  Prior GI bleed -Hemoglobin stable.  No concerns for bleeding.  FEN -Intravenous fluids: 1 L bolus -DVT PPx: Eliquis -Diet: N.p.o. for cardioversion today -Code: Full  For questions or updates, please contact [place] Please consult [email or web address] for contact info under   Time Spent with Patient: I have  spent a total of 25 minutes with patient reviewing hospital notes, telemetry, EKGs, labs and examining the patient as well as establishing an assessment and plan that was discussed with the patient.  > 50% of time was spent in direct patient care.    Signed, Addison Naegeli. Audie Box, [place]  02/06/2020 8:09 AM

## 2020-02-07 DIAGNOSIS — I252 Old myocardial infarction: Secondary | ICD-10-CM | POA: Diagnosis not present

## 2020-02-07 DIAGNOSIS — Y92018 Other place in single-family (private) house as the place of occurrence of the external cause: Secondary | ICD-10-CM | POA: Diagnosis not present

## 2020-02-07 DIAGNOSIS — M2550 Pain in unspecified joint: Secondary | ICD-10-CM | POA: Diagnosis not present

## 2020-02-07 DIAGNOSIS — R001 Bradycardia, unspecified: Secondary | ICD-10-CM | POA: Diagnosis not present

## 2020-02-07 DIAGNOSIS — F339 Major depressive disorder, recurrent, unspecified: Secondary | ICD-10-CM | POA: Diagnosis not present

## 2020-02-07 DIAGNOSIS — Z7401 Bed confinement status: Secondary | ICD-10-CM | POA: Diagnosis not present

## 2020-02-07 DIAGNOSIS — Z96641 Presence of right artificial hip joint: Secondary | ICD-10-CM | POA: Diagnosis not present

## 2020-02-07 DIAGNOSIS — R1314 Dysphagia, pharyngoesophageal phase: Secondary | ICD-10-CM | POA: Diagnosis not present

## 2020-02-07 DIAGNOSIS — I10 Essential (primary) hypertension: Secondary | ICD-10-CM | POA: Diagnosis not present

## 2020-02-07 DIAGNOSIS — M6281 Muscle weakness (generalized): Secondary | ICD-10-CM | POA: Diagnosis not present

## 2020-02-07 DIAGNOSIS — N179 Acute kidney failure, unspecified: Secondary | ICD-10-CM | POA: Diagnosis not present

## 2020-02-07 DIAGNOSIS — Z9181 History of falling: Secondary | ICD-10-CM | POA: Diagnosis not present

## 2020-02-07 DIAGNOSIS — M6282 Rhabdomyolysis: Secondary | ICD-10-CM | POA: Diagnosis not present

## 2020-02-07 DIAGNOSIS — E860 Dehydration: Secondary | ICD-10-CM | POA: Diagnosis not present

## 2020-02-07 DIAGNOSIS — E876 Hypokalemia: Secondary | ICD-10-CM | POA: Diagnosis not present

## 2020-02-07 DIAGNOSIS — R262 Difficulty in walking, not elsewhere classified: Secondary | ICD-10-CM | POA: Diagnosis not present

## 2020-02-07 DIAGNOSIS — G4733 Obstructive sleep apnea (adult) (pediatric): Secondary | ICD-10-CM | POA: Diagnosis not present

## 2020-02-07 DIAGNOSIS — G4700 Insomnia, unspecified: Secondary | ICD-10-CM | POA: Diagnosis not present

## 2020-02-07 DIAGNOSIS — I4891 Unspecified atrial fibrillation: Secondary | ICD-10-CM | POA: Diagnosis not present

## 2020-02-07 DIAGNOSIS — J302 Other seasonal allergic rhinitis: Secondary | ICD-10-CM | POA: Diagnosis not present

## 2020-02-07 DIAGNOSIS — W01198A Fall on same level from slipping, tripping and stumbling with subsequent striking against other object, initial encounter: Secondary | ICD-10-CM | POA: Diagnosis not present

## 2020-02-07 DIAGNOSIS — D649 Anemia, unspecified: Secondary | ICD-10-CM | POA: Diagnosis not present

## 2020-02-07 DIAGNOSIS — R531 Weakness: Secondary | ICD-10-CM | POA: Diagnosis not present

## 2020-02-07 DIAGNOSIS — Z8719 Personal history of other diseases of the digestive system: Secondary | ICD-10-CM | POA: Diagnosis not present

## 2020-02-07 DIAGNOSIS — F5101 Primary insomnia: Secondary | ICD-10-CM | POA: Diagnosis not present

## 2020-02-07 DIAGNOSIS — Z20822 Contact with and (suspected) exposure to covid-19: Secondary | ICD-10-CM | POA: Diagnosis not present

## 2020-02-07 DIAGNOSIS — I4819 Other persistent atrial fibrillation: Secondary | ICD-10-CM | POA: Diagnosis not present

## 2020-02-07 DIAGNOSIS — E46 Unspecified protein-calorie malnutrition: Secondary | ICD-10-CM | POA: Diagnosis not present

## 2020-02-07 DIAGNOSIS — Y9301 Activity, walking, marching and hiking: Secondary | ICD-10-CM | POA: Diagnosis not present

## 2020-02-07 DIAGNOSIS — E871 Hypo-osmolality and hyponatremia: Secondary | ICD-10-CM | POA: Diagnosis not present

## 2020-02-07 DIAGNOSIS — E785 Hyperlipidemia, unspecified: Secondary | ICD-10-CM | POA: Diagnosis not present

## 2020-02-07 DIAGNOSIS — Z23 Encounter for immunization: Secondary | ICD-10-CM | POA: Diagnosis not present

## 2020-02-07 DIAGNOSIS — E7849 Other hyperlipidemia: Secondary | ICD-10-CM | POA: Diagnosis not present

## 2020-02-07 DIAGNOSIS — M1611 Unilateral primary osteoarthritis, right hip: Secondary | ICD-10-CM | POA: Diagnosis not present

## 2020-02-07 DIAGNOSIS — K219 Gastro-esophageal reflux disease without esophagitis: Secondary | ICD-10-CM | POA: Diagnosis not present

## 2020-02-07 DIAGNOSIS — R000 Tachycardia, unspecified: Secondary | ICD-10-CM | POA: Diagnosis not present

## 2020-02-07 DIAGNOSIS — I499 Cardiac arrhythmia, unspecified: Secondary | ICD-10-CM | POA: Diagnosis not present

## 2020-02-07 LAB — BASIC METABOLIC PANEL WITH GFR
Anion gap: 11 (ref 5–15)
BUN: 7 mg/dL — ABNORMAL LOW (ref 8–23)
CO2: 25 mmol/L (ref 22–32)
Calcium: 8.9 mg/dL (ref 8.9–10.3)
Chloride: 96 mmol/L — ABNORMAL LOW (ref 98–111)
Creatinine, Ser: 0.86 mg/dL (ref 0.61–1.24)
GFR calc Af Amer: >60 mL/min
GFR calc non Af Amer: >60 mL/min
Glucose, Bld: 150 mg/dL — ABNORMAL HIGH (ref 70–99)
Potassium: 3.1 mmol/L — ABNORMAL LOW (ref 3.5–5.1)
Sodium: 132 mmol/L — ABNORMAL LOW (ref 135–145)

## 2020-02-07 LAB — MAGNESIUM: Magnesium: 1.2 mg/dL — ABNORMAL LOW (ref 1.7–2.4)

## 2020-02-07 MED ORDER — MAGNESIUM SULFATE 2 GM/50ML IV SOLN
2.0000 g | Freq: Once | INTRAVENOUS | Status: AC
  Administered 2020-02-07: 2 g via INTRAVENOUS
  Filled 2020-02-07: qty 50

## 2020-02-07 MED ORDER — MAGNESIUM OXIDE 400 (241.3 MG) MG PO TABS: 400.0000 mg | ORAL_TABLET | Freq: Every day | ORAL | Status: DC

## 2020-02-07 MED ORDER — POTASSIUM CHLORIDE CRYS ER 20 MEQ PO TBCR
40.0000 meq | EXTENDED_RELEASE_TABLET | Freq: Once | ORAL | Status: AC
  Administered 2020-02-07: 40 meq via ORAL
  Filled 2020-02-07: qty 2

## 2020-02-07 MED ORDER — MAGNESIUM OXIDE 400 (241.3 MG) MG PO TABS: 400.0000 mg | ORAL_TABLET | Freq: Every day | ORAL | 0 refills | Status: DC

## 2020-02-07 NOTE — Evaluation (Signed)
Occupational Therapy Evaluation Patient Details Name: Darryl Diaz MRN: [id number] DOB: [birth year] Today's Date: 02/07/2020    History of Present Illness 79 y.o. male admitted on 02/05/20 after falling at his mailbox at home.  Pt was found to have A-fib wiht RVR s/p cardioversion on 02/06/20, hypokalemia, weakness/mechanical fall (no apparent fx and head CT normal), multiple wounds to groin, toes, buttocks being followed by wound care RN. Pt with significant PMH of A-fib, MI, HTN, falls, MI, bradycardia, anemia, R THA, L hip closed reduction, L hip arthroplasty, L ankle surgery.    Clinical Impression   PTA pt living alone, occasionally using rollator, and reports completing all ADL/IADL independently. At time of eval, pt completing bed mobility with min A and sit <> stands with mod A and RW. Pt required x3 attempts to come into full standing with modifications of bed height and body positioning made. Pt then required max A to complete stand pivot transfers with RW to manage R posterior lean and maintain safety. Throughout evaluation, pt disoriented to place/situation. He often referred to hospital room as his home despite cues. Given pts high risk for falls, physical deconditioning, and cognitive deficits, recommend SNF at d/c to progress ADLs prior to returning home. Will continue to follow per POC listed below.    Follow Up Recommendations  SNF;Supervision/Assistance - 24 hour    Equipment Recommendations  None recommended by OT    Recommendations for Other Services       Precautions / Restrictions Precautions Precautions: Fall Precaution Comments: h/o falls Restrictions Weight Bearing Restrictions: No      Mobility Bed Mobility Overal bed mobility: Needs Assistance Bed Mobility: Supine to Sit     Supine to sit: Min assist     General bed mobility comments: assist at trunk to come into full sitting  Transfers Overall transfer level: Needs assistance Equipment used:  Rolling walker (2 wheeled) Transfers: Sit to/from [facility] Sit to Stand: Mod assist Stand pivot transfers: Max assist       General transfer comment: mod A to power into standing and to manage R posterior lean; max A to safely manage stand pivot transfer to recliner    Balance Overall balance assessment: Needs assistance Sitting-balance support: Feet supported;Bilateral upper extremity supported Sitting balance-Leahy Scale: Poor     Standing balance support: Bilateral upper extremity supported;During functional activity Standing balance-Leahy Scale: Poor Standing balance comment: UE support and mod-max A from OT                           ADL either performed or assessed with clinical judgement   ADL Overall ADL's : Needs assistance/impaired Eating/Feeding: Set up;Sitting   Grooming: Set up;Sitting Grooming Details (indicate cue type and reason): to brush hair Upper Body Bathing: Moderate assistance;Sitting   Lower Body Bathing: Maximal assistance;Sitting/lateral leans;Sit to/from stand   Upper Body Dressing : Minimal assistance;Sitting   Lower Body Dressing: Maximal assistance;Sitting/lateral leans;Sit to/from stand   Toilet Transfer: Maximal assistance;Stand-pivot;BSC;RW;Cueing for safety Toilet Transfer Details (indicate cue type and reason): simulated to recliner. Increased assist to manage balance (lean to the R). Pt required x3 trials to come into full standing. unaware of how unsafe transfer was Writer and Hygiene: Total assistance;Sit to/from stand Toileting - Clothing Manipulation Details (indicate cue type and reason): incontinent of bowel and bladder and unaware     Functional mobility during ADLs: Maximal assistance;Cueing for safety;Rolling walker;Cueing for sequencing  Vision Baseline Vision/History: Wears glasses Wears Glasses: At all times Patient Visual Report: No change from baseline        Perception     Praxis      Pertinent Vitals/Pain Pain Assessment: Faces Faces Pain Scale: Hurts even more Pain Location: right knee Pain Descriptors / Indicators: Grimacing;Guarding Pain Intervention(s): Monitored during session;Repositioned     Hand Dominance Right   Extremity/Trunk Assessment Upper Extremity Assessment Upper Extremity Assessment: Generalized weakness   Lower Extremity Assessment Lower Extremity Assessment: Defer to PT evaluation RLE Deficits / Details: right let is significantly weaker than left leg due to pain (this was the side he fell towards).  Knee is bandaged due to abrasion.  3+/5 knee and hip   Cervical / Trunk Assessment Cervical / Trunk Assessment: Kyphotic   Communication Communication Communication: No difficulties   Cognition Arousal/Alertness: Awake/alert Behavior During Therapy: WFL for tasks assessed/performed Overall Cognitive Status: Impaired/Different from baseline Area of Impairment: Orientation;Attention;Memory;Following commands;Safety/judgement;Awareness                 Orientation Level: Disoriented to;Place Current Attention Level: Sustained Memory: Decreased short-term memory Following Commands: Follows one step commands consistently Safety/Judgement: Decreased awareness of safety;Decreased awareness of deficits Awareness: Emergent   General Comments: Pt disoriented to place, stating "you saw how my house is set up when you drove here" despite cueing for current place. Keeps insisting he will be fine when he gets home and that all the neighbors will help him.   General Comments      Exercises    Shoulder Instructions      Home Living Family/patient expects to be discharged to:: Private residence Living Arrangements: Alone Available Help at Discharge: Family;Available PRN/intermittently Type of Home: House Home Access: Level entry     Home Layout: Two level;Able to live on main level with  bedroom/bathroom;Full bath on main level Alternate Level Stairs-Number of Steps: does not go upstairs   Bathroom Shower/Tub: Occupational psychologist: Handicapped height Bathroom Accessibility: Yes   Home Equipment: Environmental consultant - 2 wheels;Walker - 4 wheels;Bedside commode   Additional Comments: pt drives and does his own grocery shopping. patient reports prior to COVID he was swimming 2-3x/week at [facility] and would like to get back to that.      Prior Functioning/Environment Level of Independence: Independent with assistive device(s)        Comments: has been using RW and rollator at home; reports he manages his own medicines and bills        OT Problem List: Decreased strength;Decreased knowledge of use of DME or AE;Decreased knowledge of precautions;Decreased activity tolerance;Decreased cognition;Impaired balance (sitting and/or standing);Decreased safety awareness      OT Treatment/Interventions: Self-care/ADL training;Therapeutic exercise;Patient/family education;Balance training;Energy conservation;Therapeutic activities;DME and/or AE instruction;Cognitive remediation/compensation    OT Goals(Current goals can be found in the care plan section) Acute Rehab OT Goals Patient Stated Goal: return home OT Goal Formulation: With patient Time For Goal Achievement: 02/21/20 Potential to Achieve Goals: Good  OT Frequency: Min 2X/week   Barriers to D/C:            Co-evaluation              AM-PAC OT "6 Clicks" Daily Activity     Outcome Measure Help from another person eating meals?: A Little Help from another person taking care of personal grooming?: A Little Help from another person toileting, which includes using toliet, bedpan, or urinal?: A Lot Help from another person bathing (including  washing, rinsing, drying)?: A Lot Help from another person to put on and taking off regular upper body clothing?: A Little Help from another person to put on and taking off  regular lower body clothing?: A Lot 6 Click Score: 15   End of Session Equipment Utilized During Treatment: Gait belt;Rolling walker Nurse Communication: Mobility status  Activity Tolerance: Patient tolerated treatment well Patient left: in chair;with call bell/phone within reach;with chair alarm set  OT Visit Diagnosis: Unsteadiness on feet (R26.81);Other abnormalities of gait and mobility (R26.89);Muscle weakness (generalized) (M62.81);History of falling (Z91.81);Other symptoms and signs involving cognitive function                Time: 1019-1103 OT Time Calculation (min): 44 min Charges:  OT General Charges $OT Visit: 1 Visit OT Evaluation $OT Eval Moderate Complexity: 1 Mod OT Treatments $Self Care/Home Management : 23-37 mins  Zenovia Jarred, MSOT, OTR/L Acute Rehabilitation Services [facility] Office Number: [phone] Pager: [phone]  Zenovia Jarred 02/07/2020, 1:33 PM

## 2020-02-07 NOTE — Progress Notes (Signed)
Ordered EKG not done, because patient is resisting.

## 2020-02-07 NOTE — TOC Progression Note (Addendum)
Transition of Care [facility]) - Progression Note    Patient Details  Name: Darryl Diaz MRN: [id number] Date of Birth: May 02, 1941  Transition of Care [facility]) CM/SW [place], [place] Phone Number: 02/07/2020, 11:55 AM  Clinical Narrative:     Update on 10/1 3:24pm- CSW spoke with patient at bedside to provide SNF bed offers. Patient wanted CSW to call his son Quillian Quince to help make SNF decision. CSW called patients son and put him on speaker while in patients room to help try and make SNF choice. Patient and patients son decided on [facility]. CSW tried to call [facility] to see if they can accept patient for SNF placement. No answer. CSW left a voicemail. CSW awaiting callback to see if they can accept patient for SNF placement today.   CSW will continue to follow.  CSW received consult for possible SNF placement at time of discharge. CSW spoke with patients son Quillian Quince regarding PT recommendation of SNF placement at time of discharge.  Patients son expressed understanding of PT recommendation and is agreeable to SNF placement for patient at time of discharge. Patients son Quillian Quince reports preference for CSW to fax out initial referral to [place] area. Patients son first choice for SNF placement is [facility].Patients son reports patient has received the COVID vaccines. Patients son gave CSW permission to discuss patients care with with him as well as patients sister [name].No further questions reported at this time. CSW to continue to follow and assist with discharge planning needs.  Expected Discharge Plan: [place] Barriers to Discharge: Continued Medical Work up  Expected Discharge Plan and Services Expected Discharge Plan: [place] arrangements for the past 2 months: Single Family Home                                       Social Determinants of Health (SDOH) Interventions    Readmission  Risk Interventions Readmission Risk Prevention Plan 04/23/2019  Transportation Screening Complete  PCP or Specialist Appt within 5-7 Days Complete  Home Care Screening Complete  Medication Review (RN CM) Complete  Some recent data might be hidden

## 2020-02-07 NOTE — Progress Notes (Signed)
Attempted to give report to [facility].  Unable to reach a nurse.  Idolina Primer, RN

## 2020-02-07 NOTE — Evaluation (Signed)
Physical Therapy Evaluation Patient Details Name: Darryl Diaz MRN: [id number] DOB: [name] 28, 1941 Today's Date: 02/07/2020   History of Present Illness  79 y.o. male admitted on 02/05/20 after falling at his mailbox at home.  Pt was found to have A-fib wiht RVR s/p cardioversion on 02/06/20, hypokalemia, weakness/mechanical fall (no apparent fx and head CT normal), multiple wounds to groin, toes, buttocks being followed by wound care RN. Pt with significant PMH of A-fib, MI, HTN, falls, MI, bradycardia, anemia, R THA, L hip closed reduction, L hip arthroplasty, L ankle surgery.   Clinical Impression  Pt is significantly deconditioned and not safe on his feet even with RW and support from PT during attempts at short distance gait away from the recliner chair.  He is mod assist overall with HR in the 110s and a high fall risk. He would benefit from SNF level rehab prior to d/c home alone and even then he will need increased supervision at home (cognitive deficits).  He reports having applications in at ALFs ([facility] and [facility]) and this will likely be the safest end goal for him at this time.   PT to follow acutely for deficits listed below.    Follow Up Recommendations SNF    Equipment Recommendations  None recommended by PT    Recommendations for Other Services       Precautions / Restrictions Precautions Precautions: Fall Precaution Comments: h/o falls      Mobility  Bed Mobility               General bed mobility comments: Pt was OOB in the recliner chair.  OT just got him up.   Transfers Overall transfer level: Needs assistance Equipment used: Rolling walker (2 wheeled) Transfers: Sit to/from Stand Sit to Stand: Mod assist         General transfer comment: Mod assist to stand from lower recliner chair, right posterior lean.  Stood x3 for gait trials.  Ambulation/Gait Ambulation/Gait assistance: Mod assist Gait Distance (Feet): 5 Feet (x3) Assistive device:  Rolling walker (2 wheeled) Gait Pattern/deviations: Step-through pattern;Decreased stance time - right;Decreased weight shift to right;Decreased step length - right;Antalgic     General Gait Details: Pt with atalgic gait pattern favoring his right sore leg.  Decreased foot clearance on that side.  Very shakey and unsteady on his feet despite RW use and support   Financial trader Rankin (Stroke Patients Only)       Balance Overall balance assessment: Needs assistance Sitting-balance support: Feet supported;Bilateral upper extremity supported Sitting balance-Leahy Scale: Poor     Standing balance support: Bilateral upper extremity supported Standing balance-Leahy Scale: Poor Standing balance comment: BIl UE support on RW and mod assist from PT                             Pertinent Vitals/Pain Pain Assessment: Faces Faces Pain Scale: Hurts even more Pain Location: right knee Pain Descriptors / Indicators: Grimacing;Guarding Pain Intervention(s): Limited activity within patient's tolerance;Monitored during session;Repositioned    Home Living Family/patient expects to be discharged to:: Private residence Living Arrangements: Alone Available Help at Discharge: Family;Available PRN/intermittently (sounds like sister is closest, and sons are further) Type of Home: House Home Access: Level entry     Home Layout: Two level;Able to live on main level with bedroom/bathroom;Full bath on main level Home Equipment: Gilford Rile -  2 wheels;Walker - 4 wheels;Bedside commode Additional Comments: pt drives and does his own grocery shopping. patient reports prior to COVID he was swimming 2-3x/week at [facility] and would like to get back to that.    Prior Function Level of Independence: Independent with assistive device(s)         Comments: has been using RW and rollator at home     Hand Dominance   Dominant Hand: Right    Extremity/Trunk  Assessment   Upper Extremity Assessment Upper Extremity Assessment: Defer to OT evaluation    Lower Extremity Assessment Lower Extremity Assessment: RLE deficits/detail RLE Deficits / Details: right let is significantly weaker than left leg due to pain (this was the side he fell towards).  Knee is bandaged due to abrasion.  3+/5 knee and hip    Cervical / Trunk Assessment Cervical / Trunk Assessment: Kyphotic  Communication   Communication: No difficulties  Cognition Arousal/Alertness: Awake/alert Behavior During Therapy: WFL for tasks assessed/performed Overall Cognitive Status: Impaired/Different from baseline Area of Impairment: Orientation;Attention;Memory;Following commands;Safety/judgement;Awareness                 Orientation Level: Disoriented to;Place (every 5 mins he refers to being at home right now) Current Attention Level: Sustained Memory: Decreased short-term memory (significant STM deficits and he reports he does bills/meds) Following Commands: Follows one step commands consistently Safety/Judgement: Decreased awareness of safety;Decreased awareness of deficits Awareness: Emergent   General Comments: Not sure of pt's baseline, but he asked me to go into the garage and get his rollator, I kept having to tell him he was not currently at home.  He also thinks he will do fine "once I get home" despite significant assist needed to stand and take a few steps.      General Comments General comments (skin integrity, edema, etc.): HR 110s throughout.    Exercises General Exercises - Upper Extremity Shoulder Flexion: AROM;Both;10 reps Elbow Flexion: AROM;Both;10 reps General Exercises - Lower Extremity Long Arc Quad: AROM;Both;10 reps Hip Flexion/Marching: AROM;Both;10 reps Toe Raises: AROM;Both;10 reps Heel Raises: AROM;Both;10 reps   Assessment/Plan    PT Assessment Patient needs continued PT services  PT Problem List Decreased strength;Decreased activity  tolerance;Decreased balance;Decreased mobility;Decreased cognition;Decreased knowledge of use of DME;Decreased safety awareness;Cardiopulmonary status limiting activity;Pain;Decreased skin integrity       PT Treatment Interventions DME instruction;Gait training;Functional mobility training;Therapeutic activities;Therapeutic exercise;Balance training;Neuromuscular re-education;Cognitive remediation;Patient/family education;Modalities    PT Goals (Current goals can be found in the Care Plan section)  Acute Rehab PT Goals Patient Stated Goal: Pt is open to SNF, but wants to get back to his sun room sitting as soon as he can.   PT Goal Formulation: With patient Time For Goal Achievement: 02/21/20 Potential to Achieve Goals: Good    Frequency Min 2X/week   Barriers to discharge Decreased caregiver support      Co-evaluation               AM-PAC PT "6 Clicks" Mobility  Outcome Measure Help needed turning from your back to your side while in a flat bed without using bedrails?: A Lot Help needed moving from lying on your back to sitting on the side of a flat bed without using bedrails?: A Lot Help needed moving to and from a bed to a chair (including a wheelchair)?: A Lot Help needed standing up from a chair using your arms (e.g., wheelchair or bedside chair)?: A Lot Help needed to walk in hospital room?: A Lot Help needed  climbing 3-5 steps with a railing? : Total 6 Click Score: 11    End of Session   Activity Tolerance: Patient limited by pain Patient left: in chair;with chair alarm set;with call bell/phone within reach   PT Visit Diagnosis: History of falling (Z91.81);Muscle weakness (generalized) (M62.81);Difficulty in walking, not elsewhere classified (R26.2);Pain Pain - Right/Left: Right Pain - part of body: Leg    Time: 1105-1131 PT Time Calculation (min) (ACUTE ONLY): 26 min   Charges:   PT Evaluation $PT Eval Moderate Complexity: 1 Mod PT Treatments $Therapeutic  Activity: 8-22 mins        Verdene Lennert, PT, DPT  Acute Rehabilitation [phone] pager [phone]) [phone] office

## 2020-02-07 NOTE — TOC Transition Note (Addendum)
Transition of Care [facility]) - CM/SW Discharge Note   Patient Details  Name: Darryl Diaz MRN: [id number] Date of Birth: 12/19/1940  [facility] CM/SW Contact:  Trula Ore, [place] Phone Number: 02/07/2020, 4:06 PM   Clinical Narrative:     Patient will DC to: [facility]   Anticipated DC date: 02/07/2020  Family notified: Quillian Quince  Transport by: Corey Harold  ?  Per MD patient ready for DC to [facility] . RN, patient, patient's family, and facility notified of DC. Discharge Summary sent to facility. RN given number for report tele#[phone] RM#[phone] ask for 100 hall nurse. DC packet on chart. Ambulance transport requested for patient. Sent over [employer] letter as requested by facility signed by Kathyrn Drown NP. Requested original be placed in discharge packet.  CSW signing off.  Final next level of care: Skilled Nursing Facility Barriers to Discharge: No Barriers Identified   Patient Goals and CMS Choice Patient states their goals for this hospitalization and ongoing recovery are:: to go to SNF CMS Medicare.gov Compare Post Acute Care list provided to:: Patient (patient and patients son Quillian Quince) Choice offered to / list presented to : Patient, Adult Children (patient and patients son Quillian Quince)  Discharge Placement              Patient [facility] at: [facility] Patient to be transferred to facility by: [place] Name of family member notified: Quillian Quince Patient and family notified of of transfer: 02/07/20  Discharge Plan and Services                                     Social Determinants of Health (SDOH) Interventions     Readmission Risk Interventions Readmission Risk Prevention Plan 04/23/2019  Transportation Screening Complete  PCP or Specialist Appt within 5-7 Days Complete  Home Care Screening Complete  Medication Review (RN CM) Complete  Some recent data might be hidden

## 2020-02-07 NOTE — Discharge Instructions (Signed)

## 2020-02-07 NOTE — Discharge Summary (Addendum)
Discharge Summary    Patient ID: Darryl Diaz MRN: [id number]; DOB: 1940-08-04  Admit date: 02/05/2020 Discharge date: 02/07/2020  Primary Care Provider: [name], MD  Primary Cardiologist: Sherren Mocha, MD  Primary Electrophysiologist:  Virl Axe, MD   Discharge Diagnoses    Principal Problem:   Persistent atrial fibrillation with rapid ventricular response [facility]) Active Problems:   Hypertension   Hyperlipidemia   Fall   Atrial fibrillation with rapid ventricular response ([place])   Weakness wounds to bilateral buttocks, significant breakdown to groin, wounds on tops of toes  Diagnostic Studies/Procedures    Echo 02/06/20 1. Left ventricular ejection fraction, by estimation, is 60 to 65%. The  left ventricle has normal function. The left ventricle has no regional  wall motion abnormalities. Left ventricular diastolic parameters are  indeterminate.  2. There is mild hyperkinesis of the RV apex relative to the RV free  wall. Recommend clinical correlation if concern for underlying pulmonary  embolism.   . Right ventricular systolic function is low normal. The right  ventricular size is mildly enlarged. There is mildly elevated pulmonary  artery systolic pressure.  3. Left atrial size was mildly dilated.  4. The mitral valve is normal in structure. Suspect at least mild,  eccentric mitral regurgitation secondary to tethering of the posterior  mitral leaflet. mitral valve regurgitation.  5. The aortic valve is tricuspid. There is mild calcification of the  aortic valve. There is moderate thickening of the aortic valve. Aortic  valve regurgitation is not visualized. Mild to moderate aortic valve  sclerosis/calcification is present, without  any evidence of aortic stenosis.  6. Aortic dilatation noted. There is mild dilatation of the ascending  aorta, measuring 40 mm. There is mild dilatation of the aortic root,  measuring 39 mm.  7. The inferior vena  cava is dilated in size with <50% respiratory  variability, suggesting right atrial pressure of 15 mmHg.   Cardioversion 02/06/20 Procedure Details  The patient was NPO after midnight. Anesthesia was administered at the beside  by Dr.Stoltzfus with mg of propofol.  Cardioversion was done with synchronized biphasic defibrillation with AP pads with 200 J.  The patient converted to normal sinus rhythm. The patient tolerated the procedure well, thought had a brief run of atrial fibrillation that spontaneously converted.  IMPRESSION:  Successful cardioversion of atrial fibrillation   History of Present Illness     Darryl Diaz is a 79 y.o. 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  Most recently he has been managed with Eliquis for stroke prophylaxis and Toprol-XL 75 mg daily, doing well and last assessed by Dr. Burt Knack in June.  In the last few days, he has been exhausted. He becomes extremely SOB walking 25 feet at a time. Mild LE edema, no orthopnea or PND. He has not had chest pain. No palpitations. He presents to the ER after a mechanical fall, had walked out to his mailbox, leaned over to pick something up and fell forward hitting his head.  He was found to be in rapid atrial fibrillation on assessment, no acute injuries by CT of the head and neck.   Hospital Course     Consultants: None  1. Atrial fibrillation with RVR - He  was doing IV lopressor and then home Toprol XL. He was also started on IV cardizem for additional rate control. Compliant with Eliquis. Underwent successful cardioversion. Maintained sinus rhythm. Echo showed normal LVEF.   2. Weakness/fatigue and  recent fall - Seen by PT/OT and recommended SNF  3. Hypokalemia/hypomegnesimia - Supplement given - He will need recheck lab at facility early next week.   4. wounds to bilateral buttocks, significant breakdown to groin, wounds on tops of toes - Seen by Wound care nurse  Did the patient have an acute coronary syndrome (MI, NSTEMI, STEMI, etc) this admission?:  No                               Did the patient have a percutaneous coronary intervention (stent / angioplasty)?:  No.   _____________  Discharge Vitals Blood pressure 129/68, pulse 81, temperature 98.1 F (36.7 C), temperature source Oral, resp. rate 18, height 6' (1.829 m), weight 102 kg, SpO2 94 %.  Filed Weights   02/05/20 1124 02/06/20 1403  Weight: 104.8 kg 102 kg   Physical Exam Constitutional:      Appearance: Normal appearance.  HENT:     Head: Normocephalic.     Nose: Nose normal.  Eyes:     Extraocular Movements: Extraocular movements intact.     Pupils: Pupils are equal, round, and reactive to light.  Cardiovascular:     Rate and Rhythm: Normal rate and regular rhythm.     Pulses: Normal pulses.     Heart sounds: Normal heart sounds.  Abdominal:     General: Abdomen is flat. Bowel sounds are normal.     Palpations: Abdomen is soft.  Musculoskeletal:        General: Normal range of motion.     Cervical back: Normal range of motion.  Skin:    General: Skin is warm and dry.  Neurological:     General: No focal deficit present.     Mental Status: He is alert and oriented to person, place, and time.  Psychiatric:        Mood and Affect: Mood normal.        Behavior: Behavior normal.    Labs & Radiologic Studies    CBC Recent Labs    02/05/20 1439  WBC 7.1  NEUTROABS 5.8  HGB 10.0*  HCT 29.8*  MCV 94.3  PLT 263*   Basic Metabolic Panel Recent Labs    02/05/20 2129 02/06/20 0304 02/06/20 0916 02/07/20 0830  NA  --    < > 132* 132*  K 2.7*   < > 3.8 3.1*  CL  --    < > 97* 96*  CO2   --    < > 21* 25  GLUCOSE  --    < > 132* 150*  BUN  --    < > 14 7*  CREATININE  --    < > 1.26* 0.86  CALCIUM  --    < > 9.0 8.9  MG 0.9*  --   --  1.2*   < > = values in this interval not displayed.   Liver Function Tests Recent Labs    02/06/20 0304  AST 66*  ALT 36  ALKPHOS 98  BILITOT 1.7*  PROT 5.4*  ALBUMIN 3.1*  High Sensitivity Troponin:   Recent Labs  Lab 02/06/20 0916  TROPONINIHS 20*    Thyroid Function Tests Recent Labs  02/06/20 0303  TSH 1.764   _____________  CT Head Wo Contrast  Result Date: 02/05/2020 CLINICAL DATA:  Head trauma.  Fall, dizzy on anticoagulation. EXAM: CT HEAD WITHOUT CONTRAST TECHNIQUE: Contiguous axial images were obtained from the base of the skull through the vertex without intravenous contrast. COMPARISON:  02/25/2019 FINDINGS: Brain: No evidence of acute large vascular territory infarction, hemorrhage, hydrocephalus, extra-axial collection or mass lesion/mass effect. Similar scattered white matter hypoattenuation, compatible with chronic microvascular ischemic disease. Similar diffuse cerebral volume loss with ex vacuo ventricular dilation. Vascular: Calcific intracranial atherosclerosis. Skull: Normal. Negative for fracture or focal lesion. Sinuses/Orbits: Mild scattered ethmoid air cell mucosal thickening and trace inferior left maxillary sinus mucosal thickening. Otherwise, the sinuses are clear. No evidence of acute orbital abnormality. Other: Underpneumatized left mastoid air cells with chronic trace left mastoid effusion. IMPRESSION: 1. No evidence of acute intracranial abnormality. 2. Similar chronic microvascular ischemic disease and generalized cerebral atrophy. Electronically Signed   By: Margaretha Sheffield MD   On: 02/05/2020 14:56   CT Cervical Spine Wo Contrast  Result Date: 02/05/2020 CLINICAL DATA:  Neck trauma.  Fall. EXAM: CT CERVICAL SPINE WITHOUT CONTRAST TECHNIQUE: Multidetector CT imaging of the cervical spine was  performed without intravenous contrast. Multiplanar CT image reconstructions were also generated. COMPARISON:  None. FINDINGS: Alignment: Mild anterolisthesis of C4 on C5, favor degenerative given severe facet degenerative change at this level. Reversal of the normal cervical lordosis, favored positional/degenerative in etiology. Otherwise, no substantial subluxation. Skull base and vertebrae: No acute fracture. Vertebral body heights are maintained. No primary bone lesion or focal pathologic process. Soft tissues and spinal canal: No prevertebral fluid or swelling. No visible large canal hematoma. Disc levels: Ankylosis across the left C3-C4 and right C5-C6 facet joints. 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 Upper chest: Negative. Please see separately dictated CT of the head for intracranial valuation. IMPRESSION: 1. No evidence of acute fracture or traumatic malalignment. 2. 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 Electronically Signed   By: Margaretha Sheffield MD   On: 02/05/2020 15:04   DG Chest Port 1 View  Result Date: 02/05/2020 CLINICAL DATA:  Atrial fibrillation, presenting after mechanical fall. EXAM: PORTABLE CHEST 1 VIEW COMPARISON:  Comparison chest x-ray 04/22/2019 FINDINGS: The heart size and mediastinal contours are sign unchanged. No focal consolidation. No pulmonary edema. No pleural effusion. No pneumothorax. No acute displaced fracture. IMPRESSION: No active disease. Electronically Signed   By: Iven Finn M.D.   On: 02/05/2020 17:16   ECHOCARDIOGRAM COMPLETE  Result Date: 02/06/2020    ECHOCARDIOGRAM REPORT   Patient Name:   RIDDICK NUON Date of Exam: 02/06/2020 Medical Rec #:  [id number]        Height:       72.0 in Accession #:    [id number]       Weight:       231.0 lb Date of Birth:  1941-02-04         BSA:          2.265 m Patient Age:    3 years         BP:           119/81 mmHg Patient Gender: M                HR:  105 bpm. Exam Location:  Inpatient Procedure: 2D Echo, Cardiac Doppler and Color Doppler Indications:    ; I48.91* Unspeicified atrial fibrillation  History:        Patient has prior history of Echocardiogram examinations, most                 recent 04/21/2019. Abnormal ECG, Arrythmias:Cardiac Arrest and                 Atrial Fibrillation; Risk Factors:Hypertension and Dyslipidemia.  Sonographer:    Roseanna Rainbow RDCS Referring Phys: [id number] [place]  1. Left ventricular ejection fraction, by estimation, is 60 to 65%. The left ventricle has normal function. The left ventricle has no regional wall motion abnormalities. Left ventricular diastolic parameters are indeterminate.  2. There is mild hyperkinesis of the RV apex relative to the RV free wall. Recommend clinical correlation if concern for underlying pulmonary embolism.     . Right ventricular systolic function is low normal. The right ventricular size is mildly enlarged. There is mildly elevated pulmonary artery systolic pressure.  3. Left atrial size was mildly dilated.  4. The mitral valve is normal in structure. Suspect at least mild, eccentric mitral regurgitation secondary to tethering of the posterior mitral leaflet. mitral valve regurgitation.  5. The aortic valve is tricuspid. There is mild calcification of the aortic valve. There is moderate thickening of the aortic valve. Aortic valve regurgitation is not visualized. Mild to moderate aortic valve sclerosis/calcification is present, without any evidence of aortic stenosis.  6. Aortic dilatation noted. There is mild dilatation of the ascending aorta, measuring 40 mm. There is mild dilatation of the aortic root, measuring 39 mm.  7. The inferior vena cava is dilated in size with <50% respiratory variability, suggesting right atrial pressure of 15 mmHg.  Comparison(s): In comparison to prior TTE in 02/26/19, there is mild hyperkinesis of the RV apex relative to the RV free wall. Discussed with primary team if there is concern for pulmonary embolism. Otherwise, no significant changes from prior. FINDINGS  Left Ventricle: Left ventricular ejection fraction, by estimation, is 60 to 65%. The left ventricle has normal function. The left ventricle has no regional wall motion abnormalities. The left ventricular internal cavity size was normal in size. There is  no left ventricular hypertrophy. Left ventricular diastolic parameters are indeterminate. Right Ventricle: There is mild hyperkinesis of the RV apex relative to the RV free wall. Recommend clinical correlation if concern for underlying pulmonary embolism. The right ventricular size is mildly enlarged. No increase in right ventricular wall thickness. Right ventricular systolic function is low normal. There is mildly elevated pulmonary artery systolic pressure. The tricuspid regurgitant velocity is 2.67 m/s, and with an assumed right atrial pressure of 8 mmHg, the estimated right ventricular systolic pressure is 25.9 mmHg. Left Atrium: Left atrial size was mildly dilated. Right Atrium: Right atrial size was normal in size. Pericardium: There is no evidence of pericardial effusion. Mitral Valve: The mitral valve is normal in structure. There is mild thickening of the mitral valve leaflet(s). There is mild calcification of the mitral valve leaflet(s). Mild mitral annular calcification. Suspect at least mild, eccentric mitral regurgitation secondary to tethering of the posterior mitral leaflet. MV peak gradient, 14.1 mmHg. The mean mitral valve gradient is 4.0 mmHg. Tricuspid Valve: The tricuspid valve is normal in structure. Tricuspid valve regurgitation is trivial. Aortic Valve: The aortic valve is tricuspid. There is mild calcification of the aortic valve. There is moderate thickening  of the aortic valve. There is  mild aortic valve annular calcification. Aortic valve regurgitation is not visualized. Mild to moderate aortic valve sclerosis/calcification is present, without any evidence of aortic stenosis. Pulmonic Valve: The pulmonic valve was grossly normal. Pulmonic valve regurgitation is trivial. Aorta: Aortic dilatation noted. There is mild dilatation of the ascending aorta, measuring 40 mm. There is mild dilatation of the aortic root, measuring 39 mm. Venous: The inferior vena cava is dilated in size with less than 50% respiratory variability, suggesting right atrial pressure of 15 mmHg. IAS/Shunts: No atrial level shunt detected by color flow Doppler.  LEFT VENTRICLE PLAX 2D LVIDd:         4.10 cm LVIDs:         3.30 cm LV PW:         1.60 cm LV IVS:        1.30 cm LVOT diam:     2.10 cm LV SV:         66 LV SV Index:   29 LVOT Area:     3.46 cm  LV Volumes (MOD) LV vol d, MOD A2C: 85.6 ml LV vol d, MOD A4C: 85.4 ml LV vol s, MOD A2C: 33.5 ml LV vol s, MOD A4C: 32.9 ml LV SV MOD A2C:     52.1 ml LV SV MOD A4C:     85.4 ml LV SV MOD BP:      52.5 ml RIGHT VENTRICLE             IVC RV S prime:     16.60 cm/s  IVC diam: 3.10 cm TAPSE (M-mode): 1.5 cm LEFT ATRIUM             Index       RIGHT ATRIUM           Index LA diam:        4.50 cm 1.99 cm/m  RA Area:     17.30 cm LA Vol (A2C):   75.5 ml 33.33 ml/m RA Volume:   41.10 ml  18.14 ml/m LA Vol (A4C):   47.7 ml 21.06 ml/m LA Biplane Vol: 60.0 ml 26.49 ml/m  AORTIC VALVE LVOT Vmax:   112.00 cm/s LVOT Vmean:  78.100 cm/s LVOT VTI:    0.191 m  AORTA Ao Root diam: 4.00 cm Ao Asc diam:  4.00 cm MITRAL VALVE                 TRICUSPID VALVE MV Area (PHT): 4.33 cm      TR Peak grad:   28.5 mmHg MV Peak grad:  14.1 mmHg     TR Vmax:        267.00 cm/s MV Mean grad:  4.0 mmHg MV Vmax:       1.88 m/s      SHUNTS MV Vmean:      75.8 cm/s     Systemic VTI:  0.19 m MV Decel Time: 175 msec      Systemic Diam: 2.10 cm MR Peak grad:    114.1 mmHg MR Mean grad:    64.0 mmHg MR Vmax:          534.00 cm/s MR Vmean:        354.0 cm/s MR PISA:         1.01 cm MR PISA Eff ROA: 7 mm MR PISA Radius:  0.40 cm MV E velocity: 131.33 cm/s Gwyndolyn Kaufman MD Electronically signed by Gwyndolyn Kaufman MD Signature Date/Time: 02/06/2020/12:32:56 PM  Final    Disposition   Pt is being discharged home today in good condition.  Follow-up Plans & Appointments     Contact information for follow-up providers    [facility], [place], [place]. Go on 02/18/2020.   Specialty: Cardiology Why: @3 :15am for hospital follow up. Please arrive 15 minutes early   Please bring lab done by facility  Contact information: [address] [place] [address] [phone]            Contact information for after-discharge care    Destination    [facility] Preferred SNF .   Service: Skilled Nursing Contact information: [address] [place] [phone]                 Discharge Instructions    Diet - low sodium heart healthy   Complete by: As directed    Discharge wound care:   Complete by: As directed    As direction by wound care nurse   Increase activity slowly   Complete by: As directed       Discharge Medications   Allergies as of 02/07/2020      Reactions   Penicillins Other (See Comments)   Tolerated Cephalosporin Date: 10/15/19. Did it involve swelling of the face/tongue/throat, SOB, or low BP? Unknown Did it involve sudden or severe rash/hives, skin peeling, or any reaction on the inside of your mouth or nose? Unknown Did you need to seek medical attention at a hospital or doctor's office? Unknown When did it last happen?childhood reaction If all above answers are "NO", may proceed with cephalosporin use.      Medication List    STOP taking these medications   docusate sodium 100 MG capsule Commonly known as: Colace   HYDROcodone-acetaminophen 5-325 MG tablet Commonly known as: Norco     polyethylene glycol 17 g packet Commonly known as: MIRALAX / GLYCOLAX     TAKE these medications   apixaban 5 MG Tabs tablet Commonly known as: Eliquis Take 1 tablet (5 mg total) by mouth 2 (two) times daily.   atorvastatin 20 MG tablet Commonly known as: LIPITOR Take 20 mg by mouth daily.   escitalopram 10 MG tablet Commonly known as: LEXAPRO Take 10 mg by mouth daily.   ferrous sulfate 325 (65 FE) MG tablet Commonly known as: FerrouSul Take 1 tablet (325 mg total) by mouth 3 (three) times daily with meals for 14 days.   folic acid 1 MG tablet Commonly known as: FOLVITE Take 1 mg by mouth daily.   furosemide 40 MG tablet Commonly known as: LASIX Take 1 tablet (40 mg total) by mouth daily as needed for fluid (weight gain).   loratadine 10 MG tablet Commonly known as: CLARITIN Take 10 mg by mouth daily.   magnesium oxide 400 (241.3 Mg) MG tablet Commonly known as: MAG-OX Take 1 tablet (400 mg total) by mouth daily.   methocarbamol 500 MG tablet Commonly known as: Robaxin Take 1 tablet (500 mg total) by mouth every 6 (six) hours as needed for muscle spasms.   metoprolol succinate 25 MG 24 hr tablet Commonly known as: TOPROL-XL Take 3 tablets (75 mg total) by mouth daily.   multivitamin with minerals Tabs tablet Take 1 tablet by mouth daily.   pantoprazole 40 MG tablet Commonly known as: PROTONIX Take 40 mg by mouth daily.   potassium chloride SA 20 MEQ tablet Commonly known as: KLOR-CON Take 2 tablets by mouth once  daily   traZODone 50 MG tablet Commonly known as: DESYREL Take 50 mg by mouth at bedtime.   vitamin B-1 250 MG tablet Take 250 mg by mouth daily.            Discharge Care Instructions  (From admission, onward)         Start     Ordered   02/07/20 0000  Discharge wound care:       Comments: As direction by wound care nurse   02/07/20 1606             Outstanding Labs/Studies   BMET and Magnesium check in 3 days    Duration of Discharge Encounter   Greater than 30 minutes including physician time.  Signed, Evalina Field, MD 02/07/2020, 4:43 PM

## 2020-02-07 NOTE — NC FL2 (Signed)
[place] LEVEL OF CARE SCREENING TOOL     IDENTIFICATION  Patient Name: Darryl Diaz Birthdate: 12/10/1940 Sex: male Admission Date (Current Location): 02/05/2020  [facility] and [place] Number:  Herbalist and Address:  [facility] [place]. [facility], [place] [address] [place], [place] [address]      Provider Number: [id number]  Attending Physician Name and Address:  Geralynn Rile, MD  Relative Name and Phone Number:  Quillian Quince [phone]    Current Level of Care: Hospital Recommended Level of Care: [place] Prior Approval Number:    Date Approved/Denied:   PASRR Number: [id number]  Discharge Plan: SNF    Current Diagnoses: Patient Active Problem List   Diagnosis Date Noted  . Weakness 02/06/2020  . Persistent atrial fibrillation with rapid ventricular response ([place]) 02/05/2020  . 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 Bradycardia 04/20/2019  . Atrial fibrillation with rapid ventricular response ([place])   . GI bleed 02/25/2019  . Fall 02/25/2019  . Bilateral leg edema 02/25/2019  . Hyponatremia 02/25/2019  . Acute posthemorrhagic anemia 08/11/2018  . Symptomatic anemia 08/11/2018  . Hx of adenomatous colonic polyps 08/10/2017  . PAF (paroxysmal atrial fibrillation) ([place]) 02/05/2017  . Dislocation of hip, posterior, left, closed ([place]) 04/30/2016  . Hip fracture ([place]) 03/30/2016  . Hypertension 03/30/2016  . Hyperlipidemia 03/30/2016  . GERD (gastroesophageal reflux disease) 03/30/2016  . Osteoarthritis 03/30/2016  . Depression 03/30/2016  . Rhabdomyolysis 03/30/2016  . Leukocytosis 03/30/2016  . Pressure injury of skin 03/30/2016  . Closed fracture of left hip (HCC)     Orientation RESPIRATION BLADDER Height & Weight     Self, Time  Normal Incontinent, External catheter Weight: 224 lb 14.4 oz (102 kg) Height:  6' (182.9 cm)  BEHAVIORAL SYMPTOMS/MOOD NEUROLOGICAL BOWEL  NUTRITION STATUS      Continent Diet (See Discharge Summary)  AMBULATORY STATUS COMMUNICATION OF NEEDS Skin   Limited Assist Verbally Surgical wounds, Skin abrasions, Other (Comment) (Abrasion,Knee,R,Foam;Wound Inc,MASDGroinBilateralMoistureBarrier,redcleansed;wound inc.MASDButtocksBilateral;Moisturebarrier,red,cleansed;Wound Inc.,non-pressure toe R;Left wounds to great toe on R foot;second and last toe on L foot;twice a day,clean;dry)                       Personal Care Assistance Level of Assistance  Bathing, Feeding, Dressing Bathing Assistance: Maximum assistance Feeding assistance: Independent (able to feed self;Cardiac) Dressing Assistance: Maximum assistance     Functional Limitations Info  Sight, Hearing, Speech Sight Info: Adequate Hearing Info: Adequate Speech Info: Adequate    SPECIAL CARE FACTORS FREQUENCY  PT (By licensed PT), OT (By licensed OT)     PT Frequency: 5x min weekly OT Frequency: 5x min weekly            Contractures Contractures Info: Not present    Additional Factors Info  Code Status, Allergies, Psychotropic Code Status Info: FULL Allergies Info: Penicillins Psychotropic Info: escitalopram (LEXAPRO) tablet 10 mg daily,traZODone (DESYREL) tablet 50 mg daily at bedtime         Current Medications (02/07/2020):  This is the current hospital active medication list Current Facility-Administered Medications  Medication Dose Route Frequency Provider Last Rate Last Admin  . 0.9 %  sodium chloride infusion  250 mL Intravenous PRN Chandrasekhar, Mahesh A, MD      . acetaminophen (TYLENOL) tablet 650 mg  650 mg Oral Q4H PRN Chandrasekhar, Mahesh A, MD      . ALPRAZolam Duanne Moron) tablet 0.25 mg  0.25  mg Oral BID PRN Chandrasekhar, Mahesh A, MD   0.25 mg at 02/06/20 2230  . apixaban (ELIQUIS) tablet 5 mg  5 mg Oral BID Chandrasekhar, Mahesh A, MD   5 mg at 02/07/20 0916  . atorvastatin (LIPITOR) tablet 20 mg  20 mg Oral Daily Chandrasekhar, Mahesh A,  MD   20 mg at 02/07/20 0915  . diltiazem (CARDIZEM) 125 mg in dextrose 5% 125 mL (1 mg/mL) infusion  5-15 mg/hr Intravenous Continuous Werner Lean, MD   Held at 02/07/20 0636  . docusate sodium (COLACE) capsule 100 mg  100 mg Oral BID Chandrasekhar, Mahesh A, MD   100 mg at 02/07/20 0916  . escitalopram (LEXAPRO) tablet 10 mg  10 mg Oral Daily Chandrasekhar, Mahesh A, MD   10 mg at 02/07/20 0915  . ferrous sulfate tablet 325 mg  325 mg Oral TID WC Chandrasekhar, Mahesh A, MD   325 mg at 02/07/20 0914  . folic acid (FOLVITE) tablet 1 mg  1 mg Oral Daily Chandrasekhar, Mahesh A, MD   1 mg at 02/07/20 0914  . HYDROcodone-acetaminophen (NORCO/VICODIN) 5-325 MG per tablet 1-2 tablet  1-2 tablet Oral Q6H PRN Chandrasekhar, Mahesh A, MD      . loratadine (CLARITIN) tablet 10 mg  10 mg Oral Daily Chandrasekhar, Mahesh A, MD   10 mg at 02/07/20 0915  . methocarbamol (ROBAXIN) tablet 500 mg  500 mg Oral Q6H PRN Chandrasekhar, Mahesh A, MD      . metoprolol succinate (TOPROL-XL) 24 hr tablet 75 mg  75 mg Oral Daily Chandrasekhar, Mahesh A, MD   75 mg at 02/07/20 0915  . multivitamin with minerals tablet 1 tablet  1 tablet Oral Daily Rudean Haskell A, MD   1 tablet at 02/07/20 0914  . mupirocin cream (BACTROBAN) 2 %   Topical BID Geralynn Rile, MD   1 application at 70/26/37 0926  . nitroGLYCERIN (NITROSTAT) SL tablet 0.4 mg  0.4 mg Sublingual Q5 Min x 3 PRN Chandrasekhar, Mahesh A, MD      . ondansetron (ZOFRAN) injection 4 mg  4 mg Intravenous Q6H PRN Chandrasekhar, Mahesh A, MD      . pantoprazole (PROTONIX) EC tablet 40 mg  40 mg Oral Daily Chandrasekhar, Mahesh A, MD   40 mg at 02/07/20 0915  . polyethylene glycol (MIRALAX / GLYCOLAX) packet 17 g  17 g Oral BID Chandrasekhar, Mahesh A, MD   17 g at 02/07/20 0916  . sodium chloride flush (NS) 0.9 % injection 3 mL  3 mL Intravenous Q12H Chandrasekhar, Mahesh A, MD   3 mL at 02/07/20 0929  . sodium chloride flush (NS) 0.9 %  injection 3 mL  3 mL Intravenous PRN Chandrasekhar, Mahesh A, MD      . tamsulosin (FLOMAX) capsule 0.4 mg  0.4 mg Oral QHS Chandrasekhar, Mahesh A, MD   0.4 mg at 02/06/20 2230  . thiamine tablet 250 mg  250 mg Oral Daily Chandrasekhar, Mahesh A, MD   250 mg at 02/07/20 0914  . traZODone (DESYREL) tablet 50 mg  50 mg Oral QHS Chandrasekhar, Mahesh A, MD   50 mg at 02/06/20 2230  . zolpidem (AMBIEN) tablet 5 mg  5 mg Oral QHS PRN Werner Lean, MD       Facility-Administered Medications Ordered in Other Encounters  Medication Dose Route Frequency Provider Last Rate Last Admin  . metoprolol tartrate (LOPRESSOR) injection    Anesthesia Intra-op Mariea Clonts, CRNA   5 mg  at 02/27/19 1318     Discharge Medications: Please see discharge summary for a list of discharge medications.  Relevant Imaging Results:  Relevant Lab Results:   Additional Information [id number]  Trula Ore, LCSWA

## 2020-02-07 NOTE — Progress Notes (Signed)
Patient is confuse and hallucinating. He has not slept at all.  He has attempted to exit the bed throughout the night. He has pulled out two IV's so Cardizem gtt is on hold. Reorientation/redirection has been unsuccessful. PRN Xanax given at 2230 was not effective. On call cards fellow Dr Sonia Side made aware; awaiting response.

## 2020-02-09 ENCOUNTER — Encounter (HOSPITAL_COMMUNITY): Payer: Self-pay | Admitting: Internal Medicine

## 2020-02-10 ENCOUNTER — Non-Acute Institutional Stay (SKILLED_NURSING_FACILITY): Payer: Medicare Other | Admitting: Internal Medicine

## 2020-02-10 ENCOUNTER — Encounter: Payer: Self-pay | Admitting: Internal Medicine

## 2020-02-10 DIAGNOSIS — I4891 Unspecified atrial fibrillation: Secondary | ICD-10-CM

## 2020-02-10 DIAGNOSIS — D649 Anemia, unspecified: Secondary | ICD-10-CM | POA: Diagnosis not present

## 2020-02-10 DIAGNOSIS — E46 Unspecified protein-calorie malnutrition: Secondary | ICD-10-CM | POA: Insufficient documentation

## 2020-02-10 DIAGNOSIS — N179 Acute kidney failure, unspecified: Secondary | ICD-10-CM | POA: Diagnosis not present

## 2020-02-10 DIAGNOSIS — R531 Weakness: Secondary | ICD-10-CM | POA: Diagnosis not present

## 2020-02-10 NOTE — Assessment & Plan Note (Signed)
Clinically he appears to be back in A. fib with adequate rate control.  Eliquis will be continued. Cardiology follow-up scheduled.

## 2020-02-10 NOTE — Progress Notes (Signed)
NURSING HOME LOCATION:  [facility] ROOM NUMBER:  [id number]  CODE STATUS:  FULL CODE  PCP:  [name], MD  [address] [place] [address]   This is a comprehensive admission note to [facility] performed on this date less than 30 days from date of admission. Included are preadmission medical/surgical history; reconciled medication list; family history; social history and comprehensive review of systems.  Corrections and additions to the records were documented. Comprehensive physical exam was also performed. Additionally a clinical summary was entered for each active diagnosis pertinent to this admission in the Problem List to enhance continuity of care.  HPI: Patient was hospitalized 9/29-10/05/2019 for persistent A. fib with rapid ventricular response with clinical presentation of profound DOE, weakness and fatigue.  He presented to the ED after mechanical fall. IV Lopressor and Cardizem were combined for optimal rate control.  Hypomagnesemia and hypokalemia were repleted. Echo 9/30 revealed EF 58-59%; LV diastolic parameters were indeterminate.  RV systolic function was low normal and RV size mildly enlarged.  There was mild elevation of the pulmonary artery systolic pressure.  Mild LAE and MR were present.  Mild aortic dilation was present.  Inferior vena cava was also dilated. Cardioversion was performed 9/30 with conversion to NSR.  He did have a brief run of atrial fib that spontaneously converted. Magnesium was 1.2 at discharge.  He exhibited AKI with creatinine of 1.32 and GFR of 51 indicating stage IIIa.  At discharge creatinine was 0.86 and GFR greater than 60.  Suboptimal nutrition was suggested by albumin 3.1 and total protein of 5.4.  Normochromic, normocytic anemia was present with H/H of 10/29.8.  Platelet count was reduced at 116,000.  Glucoses ranged from 125 up to 150. Wounds present prior to admission include lesions of the bilateral buttocks with  significant breakdown in the groin as well as wounds over the dorsum of the toes. PT/OT recommended SNF for rehab.  Past medical and surgical history: Includes paroxysmal A. fib, obstructive sleep apnea, history of MI/bradycardic cardiac arrest, history of situational depression, essential hypertension, dyslipidemia, history of adenomatous colon polyps, history of GI bleed secondary to Mallory Weiss tear, GERD, history of alcohol abuse, history of anemia, and history of hepatic steatosis. Surgeries and procedures include cardioversion x2, hip arthroplasty bilaterally, colonoscopy, and EGD.  Social history: Former history of alcohol excess.  Former intermittent smoker over 2 decades.  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  Family history: Strong family history of heart failure and heart disease.   Review of systems: He states that he has been to PT/OT today and is having less exertional dyspnea.  He attributed his former severe DOE to "being in bad shape".  He describes occasional pedal edema for which he takes Lasix as needed.  Reflux symptoms have improved.  He admits to having been depressed but is on medication with good control.  He describes some incontinence.  Visual acuity is impacted by history of detached retina.  Constitutional: No fever, significant weight change  Eyes: No redness, discharge, pain ENT/mouth: No nasal congestion, purulent discharge, earache, change in hearing, sore throat  Cardiovascular: No chest pain, palpitations, paroxysmal nocturnal dyspnea, claudication  Respiratory: No cough, sputum production, hemoptysis,  significant snoring, apnea Gastrointestinal: No heartburn, dysphagia, abdominal pain, nausea /vomiting, rectal bleeding, melena, change in bowels Genitourinary: No dysuria, hematuria, pyuria, nocturia Musculoskeletal: No joint stiffness, joint swelling, weakness, pain Dermatologic: No rash, pruritus Neurologic: No  dizziness,  headache, syncope, seizures, numbness, tingling Psychiatric: No significant  insomnia, anorexia Endocrine: No change in hair/skin/nails, excessive thirst, excessive hunger, excessive urination  Hematologic/lymphatic: No significant lymphadenopathy, abnormal bleeding Allergy/immunology: No significant sneezing, urticaria, angioedema  Physical exam:  Pertinent or positive findings: He is unshaven.  Clinically A. fib is suggested.  He has rales at the bases.  Abdomen is protuberant.  Nonpitting edema is present.  Pedal pulses are decreased.  He has minimal scattered bruising over the forearms.  The right knee is dressed.  Strength to opposition is fair-good and symmetric.  General appearance: no acute distress, increased work of breathing is present.   Lymphatic: No lymphadenopathy about the head, neck, axilla. Eyes: No conjunctival inflammation or lid edema is present. There is no scleral icterus. Ears:  External ear exam shows no significant lesions or deformities.   Nose:  External nasal examination shows no deformity or inflammation. Nasal mucosa are pink and moist without lesions, exudates Oral exam: Lips and gums are healthy appearing.There is no oropharyngeal erythema or exudate. Neck:  No thyromegaly, masses, tenderness noted.    Heart:  No gallop, murmur, click, rub.  Lungs: without wheezes, rhonchi, rubs. Abdomen: Bowel sounds are normal.  Abdomen is soft and nontender with no organomegaly, hernias, masses. GU: Deferred  Extremities:  No cyanosis, clubbing. Neurologic exam:Balance, Rhomberg, finger to nose testing could not be completed due to clinical state Skin: Warm & dry w/o tenting. No significant rash.  See clinical summary under each active problem in the Problem List with associated updated therapeutic plan

## 2020-02-10 NOTE — Assessment & Plan Note (Signed)
Avoid nephrotoxic medications

## 2020-02-10 NOTE — Assessment & Plan Note (Addendum)
Albumin 3.1 and total protein 5.4. Nutrition consult at SNF with protein supplementation essential for wound healing.  Wound care nurse will monitor pre-existing wounds at the SNF.

## 2020-02-10 NOTE — Patient Instructions (Signed)
See assessment and plan under each diagnosis in the problem list and acutely for this visit 

## 2020-02-10 NOTE — Assessment & Plan Note (Deleted)
PT/OT at SNF.  Discontinue Robaxin due to high risk of recurrent falls.

## 2020-02-10 NOTE — Assessment & Plan Note (Addendum)
H/H 10/29.8 with normochromic, normocytic indices.  Platelet count reduced 116,000.  Monitor for any bleeding dyscrasias on Eliquis.

## 2020-02-10 NOTE — Assessment & Plan Note (Addendum)
PT/OT at SNF.  Discontinue Robaxin due to high risk of recurrent falls. Continue potassium and magnesium supplementation.

## 2020-02-11 ENCOUNTER — Other Ambulatory Visit: Payer: Self-pay | Admitting: *Deleted

## 2020-02-11 NOTE — Patient Outreach (Signed)
[facility]) Care Management  02/11/2020  Darryl Diaz Apr 08, 1941 [id number]   Telephone Assessment  RN discharged from the hospital to a SNF [facility]). [name], RN will follow accordingly for [facility] services.  Case closed until reassigned to this RN case Freight forwarder.  [name], RN Care Management Coordinator [place] Office [phone]

## 2020-02-12 ENCOUNTER — Encounter: Payer: Self-pay | Admitting: Adult Health

## 2020-02-12 ENCOUNTER — Non-Acute Institutional Stay (SKILLED_NURSING_FACILITY): Payer: Medicare Other | Admitting: Adult Health

## 2020-02-12 DIAGNOSIS — E7849 Other hyperlipidemia: Secondary | ICD-10-CM | POA: Diagnosis not present

## 2020-02-12 DIAGNOSIS — I4891 Unspecified atrial fibrillation: Secondary | ICD-10-CM | POA: Diagnosis not present

## 2020-02-12 DIAGNOSIS — E876 Hypokalemia: Secondary | ICD-10-CM | POA: Diagnosis not present

## 2020-02-12 DIAGNOSIS — F5101 Primary insomnia: Secondary | ICD-10-CM

## 2020-02-12 DIAGNOSIS — F339 Major depressive disorder, recurrent, unspecified: Secondary | ICD-10-CM

## 2020-02-12 DIAGNOSIS — Z8719 Personal history of other diseases of the digestive system: Secondary | ICD-10-CM

## 2020-02-12 DIAGNOSIS — R531 Weakness: Secondary | ICD-10-CM | POA: Diagnosis not present

## 2020-02-12 DIAGNOSIS — N179 Acute kidney failure, unspecified: Secondary | ICD-10-CM

## 2020-02-12 DIAGNOSIS — E46 Unspecified protein-calorie malnutrition: Secondary | ICD-10-CM | POA: Diagnosis not present

## 2020-02-12 DIAGNOSIS — D649 Anemia, unspecified: Secondary | ICD-10-CM

## 2020-02-12 MED ORDER — FOLIC ACID 1 MG PO TABS
1.0000 mg | ORAL_TABLET | Freq: Every day | ORAL | 0 refills | Status: DC
Start: 2020-02-12 — End: 2020-03-10

## 2020-02-12 MED ORDER — POTASSIUM CHLORIDE CRYS ER 20 MEQ PO TBCR: 40.0000 meq | EXTENDED_RELEASE_TABLET | Freq: Every day | ORAL | 0 refills | Status: DC

## 2020-02-12 MED ORDER — METOPROLOL SUCCINATE ER 25 MG PO TB24: 75.0000 mg | ORAL_TABLET | Freq: Every day | ORAL | 0 refills | Status: DC

## 2020-02-12 MED ORDER — FUROSEMIDE 40 MG PO TABS: 40.0000 mg | ORAL_TABLET | Freq: Every day | ORAL | 0 refills | Status: DC | PRN

## 2020-02-12 MED ORDER — ATORVASTATIN CALCIUM 20 MG PO TABS: 20.0000 mg | ORAL_TABLET | Freq: Every day | ORAL | 0 refills | Status: DC

## 2020-02-12 MED ORDER — MAGNESIUM OXIDE 400 (241.3 MG) MG PO TABS: 400.0000 mg | ORAL_TABLET | Freq: Every day | ORAL | 0 refills | Status: DC

## 2020-02-12 MED ORDER — VITAMIN B-1 250 MG PO TABS
250.0000 mg | ORAL_TABLET | Freq: Every day | ORAL | 0 refills | Status: DC
Start: 2020-02-12 — End: 2020-03-11

## 2020-02-12 MED ORDER — LORATADINE 10 MG PO TABS
10.0000 mg | ORAL_TABLET | Freq: Every day | ORAL | 0 refills | Status: DC
Start: 2020-02-12 — End: 2021-05-02

## 2020-02-12 MED ORDER — PANTOPRAZOLE SODIUM 40 MG PO TBEC: 40.0000 mg | DELAYED_RELEASE_TABLET | Freq: Every day | ORAL | 0 refills | Status: DC

## 2020-02-12 MED ORDER — ESCITALOPRAM OXALATE 10 MG PO TABS: 10.0000 mg | ORAL_TABLET | Freq: Every day | ORAL | 0 refills | Status: DC

## 2020-02-12 MED ORDER — TRAZODONE HCL 50 MG PO TABS: 50.0000 mg | ORAL_TABLET | Freq: Every day | ORAL | 0 refills | Status: DC

## 2020-02-12 MED ORDER — APIXABAN 5 MG PO TABS: 5.0000 mg | ORAL_TABLET | Freq: Two times a day (BID) | ORAL | 0 refills | Status: DC

## 2020-02-12 MED ORDER — FERROUS SULFATE 325 (65 FE) MG PO TABS: 325.0000 mg | ORAL_TABLET | Freq: Two times a day (BID) | ORAL | 0 refills | Status: DC

## 2020-02-12 NOTE — Progress Notes (Signed)
Location:  [facility] Room Number: 119-A Place of Service:  SNF (31) Provider:  Durenda Age, DNP, FNP-BC  Patient Care Team: Tisovec, [name], MD as PCP - General (Internal Medicine) Sherren Mocha, MD as PCP - Cardiology (Cardiology) Deboraha Sprang, MD as PCP - Electrophysiology (Cardiology)  Extended Emergency Contact Information Primary Emergency Contact: [place] Phone: [phone] Mobile Phone: [phone] Relation: Son Secondary Emergency Contact: [name],[name]          [place] Johnnette Litter of [place] Phone: [phone] Mobile Phone: [phone] Relation: Sister  Code Status:  FULL CODE  Goals of care: Advanced Directive information Advanced Directives 02/10/2020  Does Patient Have a Medical Advance Directive? No  Type of Advance Directive -  Does patient want to make changes to medical advance directive? -  Copy of [place] in Chart? -  Would patient like information on creating a medical advance directive? No - Patient declined     Chief Complaint  Patient presents with  . Discharge Note    Patient is seen for discharge home on 02/15/20.    HPI:  Pt is a 79 y.o. male seen today for discharge home on 10/9/202 with home health PT and OT.  He was admitted to [facility] on 02/07/2020 post [facility] hospitalization 02/05/2020 to 02/07/2020.  He has a PMH of PAF, OSA, history of MI, situational depression, essential hypertension, dyslipidemia, Mallory-Weiss tear, anemia, alcohol abuse and hepatic steatosis.  Prior to hospitalization, he has been exhausted and becomes extremely SOB walking 25 ft at a time.  He presented to the ER post mechanical fall, had walked out to his mailbox, leaned over to pick up something  and fell forward hitting his head.  He was found to be in rapid atrial fibrillation.  CT of the head and neck were negative.  He was given IV Lopressor then changed to  Toprol-XL.  He was, also, started on IV Cardizem for additional rate control.  He has been compliant with his Eliquis and underwent successful cardioversion.  Patient was admitted to this facility for short-term rehabilitation after the patient's recent hospitalization.  Patient has completed SNF rehabilitation and therapy has cleared the patient for discharge.   Past Medical History:  Diagnosis Date  . Alcoholism ([place])    in remission following wife's death  . Anemia   . Bradycardia    low 20's  . Cardiac arrest ([place]) 04/20/2019   12 min CPR with epi  . Dysrhythmia   . Fall at home 03/28/2016  . Fatty liver   . GERD (gastroesophageal reflux disease)   . H/O seasonal allergies   . History of GI bleed   . Hx of adenomatous colonic polyps 08/10/2017  . Hyperlipidemia   . Hypertension   . Insomnia   . 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 MI (myocardial infarction) ([place]) 04/2019   had CPR by EMS  . OA (osteoarthritis)    hands  . OSA (obstructive sleep apnea)    No longer uses CPAP  . Persistent atrial fibrillation with rapid ventricular response ([place]) 02/05/2020   Past Surgical History:  Procedure Laterality Date  . ANKLE SURGERY Left 2014   had rods put in   . BIOPSY  02/28/2019   Procedure: BIOPSY;  Surgeon: Milus Banister, MD;  Location: [facility] ENDOSCOPY;  Service: Endoscopy;;  . CARDIOVERSION  02/06/2020  . CARDIOVERSION N/A 02/06/2020   Procedure: CARDIOVERSION;  Surgeon:  Werner Lean, MD;  Location: [facility] ENDOSCOPY;  Service: Cardiovascular;  Laterality: N/A;  . [place]  . ESOPHAGOGASTRODUODENOSCOPY (EGD) WITH PROPOFOL N/A 02/28/2019   Procedure: ESOPHAGOGASTRODUODENOSCOPY (EGD) WITH PROPOFOL;  Surgeon: Milus Banister, MD;  Location: [facility] ENDOSCOPY;  Service: Endoscopy;  Laterality: N/A;  . HIP ARTHROPLASTY Left 04/01/2016   Procedure: ARTHROPLASTY BIPOLAR HIP (HEMIARTHROPLASTY);  Surgeon: Paralee Cancel, MD;  Location: [facility];   Service: Orthopedics;  Laterality: Left;  . HIP CLOSED REDUCTION Left 04/30/2016   Procedure: CLOSED REDUCTION HIP;  Surgeon: Wylene Simmer, MD;  Location: [facility];  Service: Orthopedics;  Laterality: Left;  . TONSILLECTOMY    . TOTAL HIP ARTHROPLASTY Right 10/15/2019   Procedure: TOTAL HIP ARTHROPLASTY ANTERIOR APPROACH;  Surgeon: Paralee Cancel, MD;  Location: [facility] ORS;  Service: Orthopedics;  Laterality: Right;  70 mins    Allergies  Allergen Reactions  . Penicillins Other (See Comments)    Tolerated Cephalosporin Date: 10/15/19.  Did it involve swelling of the face/tongue/throat, SOB, or low BP? Unknown Did it involve sudden or severe rash/hives, skin peeling, or any reaction on the inside of your mouth or nose? Unknown Did you need to seek medical attention at a hospital or doctor's office? Unknown When did it last happen?childhood reaction If all above answers are "NO", may proceed with cephalosporin use.    Outpatient Encounter Medications as of 02/12/2020  Medication Sig  . apixaban (ELIQUIS) 5 MG TABS tablet Take 1 tablet (5 mg total) by mouth 2 (two) times daily.  Marland Kitchen atorvastatin (LIPITOR) 20 MG tablet Take 20 mg by mouth daily.  Marland Kitchen escitalopram (LEXAPRO) 10 MG tablet Take 10 mg by mouth daily.  . ferrous sulfate 325 (65 FE) MG tablet Take 325 mg by mouth 2 (two) times daily with a meal.  . folic acid (FOLVITE) 1 MG tablet Take 1 mg by mouth daily.  . furosemide (LASIX) 40 MG tablet Take 1 tablet (40 mg total) by mouth daily as needed for fluid (weight gain).  Marland Kitchen loratadine (CLARITIN) 10 MG tablet Take 10 mg by mouth daily.  . magnesium oxide (MAG-OX) 400 (241.3 Mg) MG tablet Take 1 tablet (400 mg total) by mouth daily.  . methocarbamol (ROBAXIN) 500 MG tablet Take 1 tablet (500 mg total) by mouth every 6 (six) hours as needed for muscle spasms.  . metoprolol succinate (TOPROL-XL) 25 MG 24 hr tablet Take 3 tablets (75 mg total) by mouth daily.  . Multiple Vitamin (MULTIVITAMIN WITH  MINERALS) TABS tablet Take 1 tablet by mouth daily.  . pantoprazole (PROTONIX) 40 MG tablet Take 40 mg by mouth daily.  . potassium chloride SA (KLOR-CON) 20 MEQ tablet Take 2 tablets by mouth once daily  . Thiamine HCl (VITAMIN B-1) 250 MG tablet Take 250 mg by mouth daily.  . traZODone (DESYREL) 50 MG tablet Take 50 mg by mouth at bedtime.   Facility-Administered Encounter Medications as of 02/12/2020  Medication  . metoprolol tartrate (LOPRESSOR) injection    Review of Systems  GENERAL: No change in appetite, no fatigue, no weight changes, no fever, chills or weakness MOUTH and THROAT: Denies oral discomfort, gingival pain or bleeding RESPIRATORY: no cough, SOB, DOE, wheezing, hemoptysis CARDIAC: No chest pain or palpitations GI: No abdominal pain, diarrhea, constipation, heart burn, nausea or vomiting GU: Denies dysuria, frequency, hematuria, incontinence, or discharge NEUROLOGICAL: Denies dizziness, syncope, numbness, or headache PSYCHIATRIC: Denies feelings of depression or anxiety. No report of hallucinations, insomnia, paranoia, or agitation  Immunization History  Administered Date(s) Administered  . Fluad Quad(high Dose 65+) 02/07/2020  . Influenza, High Dose Seasonal PF 03/01/2018  . Influenza-Unspecified 02/29/2016  . PFIZER SARS-COV-2 Vaccination 06/30/2019, 07/24/2019   Pertinent  Health Maintenance Due  Topic Date Due  . PNA vac Low Risk Adult (1 of 2 - PCV13) Never done  . INFLUENZA VACCINE  Completed   Fall Risk  04/08/2019  Falls in the past year? 0  Number falls in past yr: 0  Injury with Fall? 0  Risk for fall due to : Other (Comment)  Risk for fall due to: Comment medical condition with low H/H levels  Follow up Falls prevention discussed     Vitals:   02/12/20 0923  BP: 110/72  Pulse: (!) 102  Resp: 16  Temp: 97.8 F (36.6 C)  TempSrc: Oral  Weight: 224 lb 14.4 oz (102 kg)  Height: 6' (1.829 m)   Body mass index is 30.5 kg/m.  Physical  Exam  GENERAL APPEARANCE: Well nourished. In no acute distress. Normal body habitus SKIN:  Skin is warm and dry.  MOUTH and THROAT: Lips are without lesions. Oral mucosa is moist and without lesions. Tongue is normal in shape, size, and color and without lesions RESPIRATORY: Breathing is even & unlabored, BS CTAB CARDIAC: RRR, no murmur,no extra heart sounds, BLE 1+ edema GI: Abdomen soft, normal BS, no masses, no tenderness EXTREMITIES:  Able to move X 4 extremities. NEUROLOGICAL: There is no tremor. Speech is clear.  Alert and oriented X 3. PSYCHIATRIC: Affect and behavior are appropriate  Labs reviewed: Recent Labs    04/21/19 0248 04/21/19 0248 04/22/19 0244 04/23/19 0648 02/05/20 1909 02/05/20 2129 02/06/20 0304 02/06/20 0916 02/07/20 0830  NA 138   < > 140   < >   < >  --  131* 132* 132*  K 4.8   < > 3.8   < >   < > 2.7* 3.2* 3.8 3.1*  CL 109   < > 106   < >   < >  --  96* 97* 96*  CO2 19*   < > 24   < >   < >  --  22 21* 25  GLUCOSE 87   < > 122*   < >   < >  --  125* 132* 150*  BUN 21   < > 15   < >   < >  --  15 14 7*  CREATININE 1.51*   < > 1.06   < >   < >  --  1.24 1.26* 0.86  CALCIUM 8.6*   < > 9.1   < >   < >  --  8.5* 9.0 8.9  MG 1.7   < > 1.5*  --   --  0.9*  --   --  1.2*  PHOS 3.8  --  3.5  --   --   --   --   --   --    < > = values in this interval not displayed.   Recent Labs    03/02/19 0228 03/05/19 0338 02/06/20 0304  AST 66* 73* 66*  ALT 43 50* 36  ALKPHOS 96 84 98  BILITOT 1.8* 1.3* 1.7*  PROT 5.3* 5.8* 5.4*  ALBUMIN 2.9* 2.9* 3.1*   Recent Labs    02/25/19 1729 02/26/19 0557 10/03/19 1122 10/16/19 0257 02/05/20 1439  WBC 8.0   < > 6.3 11.7* 7.1  NEUTROABS 5.9  --   --   --  5.8  HGB 6.9*   < > 9.5* 8.1* 10.0*  HCT 19.7*   < > 29.5* 25.1* 29.8*  MCV 97.5   < > 100.0 98.8 94.3  PLT 117*   < > 189 163 116*   < > = values in this interval not displayed.   Lab Results  Component Value Date   TSH 1.764 02/06/2020   Lab Results   Component Value Date   HGBA1C 4.7 (L) 02/28/2019   Lab Results  Component Value Date   CHOL 81 02/28/2019   HDL 47 02/28/2019   LDLCALC 29 02/28/2019   TRIG 27 02/28/2019   CHOLHDL 1.7 02/28/2019    Significant Diagnostic Results in last 30 days:  CT Head Wo Contrast  Result Date: 02/05/2020 CLINICAL DATA:  Head trauma.  Fall, dizzy on anticoagulation. EXAM: CT HEAD WITHOUT CONTRAST TECHNIQUE: Contiguous axial images were obtained from the base of the skull through the vertex without intravenous contrast. COMPARISON:  02/25/2019 FINDINGS: Brain: No evidence of acute large vascular territory infarction, hemorrhage, hydrocephalus, extra-axial collection or mass lesion/mass effect. Similar scattered white matter hypoattenuation, compatible with chronic microvascular ischemic disease. Similar diffuse cerebral volume loss with ex vacuo ventricular dilation. Vascular: Calcific intracranial atherosclerosis. Skull: Normal. Negative for fracture or focal lesion. Sinuses/Orbits: Mild scattered ethmoid air cell mucosal thickening and trace inferior left maxillary sinus mucosal thickening. Otherwise, the sinuses are clear. No evidence of acute orbital abnormality. Other: Underpneumatized left mastoid air cells with chronic trace left mastoid effusion. IMPRESSION: 1. No evidence of acute intracranial abnormality. 2. Similar chronic microvascular ischemic disease and generalized cerebral atrophy. Electronically Signed   By: Margaretha Sheffield MD   On: 02/05/2020 14:56   CT Cervical Spine Wo Contrast  Result Date: 02/05/2020 CLINICAL DATA:  Neck trauma.  Fall. EXAM: CT CERVICAL SPINE WITHOUT CONTRAST TECHNIQUE: Multidetector CT imaging of the cervical spine was performed without intravenous contrast. Multiplanar CT image reconstructions were also generated. COMPARISON:  None. FINDINGS: Alignment: Mild anterolisthesis of C4 on C5, favor degenerative given severe facet degenerative change at this level. Reversal  of the normal cervical lordosis, favored positional/degenerative in etiology. Otherwise, no substantial subluxation. Skull base and vertebrae: No acute fracture. Vertebral body heights are maintained. No primary bone lesion or focal pathologic process. Soft tissues and spinal canal: No prevertebral fluid or swelling. No visible large canal hematoma. Disc levels: Ankylosis across the left C3-C4 and right C5-C6 facet joints. 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 Upper chest: Negative. Please see separately dictated CT of the head for intracranial valuation. IMPRESSION: 1. No evidence of acute fracture or traumatic malalignment. 2. 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 Electronically Signed   By: Margaretha Sheffield MD   On: 02/05/2020 15:04   DG Chest Port 1 View  Result Date: 02/05/2020 CLINICAL DATA:  Atrial fibrillation, presenting after mechanical fall. EXAM: PORTABLE CHEST 1 VIEW COMPARISON:  Comparison chest x-ray 04/22/2019 FINDINGS: The heart size and mediastinal contours are sign unchanged. No focal consolidation. No pulmonary edema. No pleural effusion. No pneumothorax. No acute displaced fracture. IMPRESSION: No active disease. Electronically Signed   By: Iven Finn M.D.   On: 02/05/2020 17:16   ECHOCARDIOGRAM COMPLETE  Result Date: 02/06/2020    ECHOCARDIOGRAM REPORT   Patient Name:   Darryl Diaz Date of Exam: 02/06/2020 Medical Rec #:  [id number]  Height:       72.0 in Accession #:    [id number]       Weight:       231.0 lb Date of Birth:  12/03/1940        BSA:          2.265 m Patient Age:    79 years         BP:           119/81 mmHg Patient Gender: M                HR:           105 bpm. Exam Location:  Inpatient Procedure: 2D Echo, Cardiac Doppler and Color Doppler Indications:    ; I48.91*  Unspeicified atrial fibrillation  History:        Patient has prior history of Echocardiogram examinations, most                 recent 04/21/2019. Abnormal ECG, Arrythmias:Cardiac Arrest and                 Atrial Fibrillation; Risk Factors:Hypertension and Dyslipidemia.  Sonographer:    Roseanna Rainbow RDCS Referring Phys: [id number] [place]  1. Left ventricular ejection fraction, by estimation, is 60 to 65%. The left ventricle has normal function. The left ventricle has no regional wall motion abnormalities. Left ventricular diastolic parameters are indeterminate.  2. There is mild hyperkinesis of the RV apex relative to the RV free wall. Recommend clinical correlation if concern for underlying pulmonary embolism.     . Right ventricular systolic function is low normal. The right ventricular size is mildly enlarged. There is mildly elevated pulmonary artery systolic pressure.  3. Left atrial size was mildly dilated.  4. The mitral valve is normal in structure. Suspect at least mild, eccentric mitral regurgitation secondary to tethering of the posterior mitral leaflet. mitral valve regurgitation.  5. The aortic valve is tricuspid. There is mild calcification of the aortic valve. There is moderate thickening of the aortic valve. Aortic valve regurgitation is not visualized. Mild to moderate aortic valve sclerosis/calcification is present, without any evidence of aortic stenosis.  6. Aortic dilatation noted. There is mild dilatation of the ascending aorta, measuring 40 mm. There is mild dilatation of the aortic root, measuring 39 mm.  7. The inferior vena cava is dilated in size with <50% respiratory variability, suggesting right atrial pressure of 15 mmHg. Comparison(s): In comparison to prior TTE in 02/26/19, there is mild hyperkinesis of the RV apex relative to the RV free wall. Discussed with primary team if there is concern for pulmonary embolism. Otherwise, no significant changes from prior.  FINDINGS  Left Ventricle: Left ventricular ejection fraction, by estimation, is 60 to 65%. The left ventricle has normal function. The left ventricle has no regional wall motion abnormalities. The left ventricular internal cavity size was normal in size. There is  no left ventricular hypertrophy. Left ventricular diastolic parameters are indeterminate. Right Ventricle: There is mild hyperkinesis of the RV apex relative to the RV free wall. Recommend clinical correlation if concern for underlying pulmonary embolism. The right ventricular size is mildly enlarged. No increase in right ventricular wall thickness. Right ventricular systolic function is low normal. There is mildly elevated pulmonary artery systolic pressure. The tricuspid regurgitant velocity is 2.67 m/s, and with an assumed right atrial pressure of 8 mmHg, the estimated right ventricular systolic pressure is 91.6 mmHg. Left Atrium: Left atrial size  was mildly dilated. Right Atrium: Right atrial size was normal in size. Pericardium: There is no evidence of pericardial effusion. Mitral Valve: The mitral valve is normal in structure. There is mild thickening of the mitral valve leaflet(s). There is mild calcification of the mitral valve leaflet(s). Mild mitral annular calcification. Suspect at least mild, eccentric mitral regurgitation secondary to tethering of the posterior mitral leaflet. MV peak gradient, 14.1 mmHg. The mean mitral valve gradient is 4.0 mmHg. Tricuspid Valve: The tricuspid valve is normal in structure. Tricuspid valve regurgitation is trivial. Aortic Valve: The aortic valve is tricuspid. There is mild calcification of the aortic valve. There is moderate thickening of the aortic valve. There is mild aortic valve annular calcification. Aortic valve regurgitation is not visualized. Mild to moderate aortic valve sclerosis/calcification is present, without any evidence of aortic stenosis. Pulmonic Valve: The pulmonic valve was grossly normal.  Pulmonic valve regurgitation is trivial. Aorta: Aortic dilatation noted. There is mild dilatation of the ascending aorta, measuring 40 mm. There is mild dilatation of the aortic root, measuring 39 mm. Venous: The inferior vena cava is dilated in size with less than 50% respiratory variability, suggesting right atrial pressure of 15 mmHg. IAS/Shunts: No atrial level shunt detected by color flow Doppler.  LEFT VENTRICLE PLAX 2D LVIDd:         4.10 cm LVIDs:         3.30 cm LV PW:         1.60 cm LV IVS:        1.30 cm LVOT diam:     2.10 cm LV SV:         66 LV SV Index:   29 LVOT Area:     3.46 cm  LV Volumes (MOD) LV vol d, MOD A2C: 85.6 ml LV vol d, MOD A4C: 85.4 ml LV vol s, MOD A2C: 33.5 ml LV vol s, MOD A4C: 32.9 ml LV SV MOD A2C:     52.1 ml LV SV MOD A4C:     85.4 ml LV SV MOD BP:      52.5 ml RIGHT VENTRICLE             IVC RV S prime:     16.60 cm/s  IVC diam: 3.10 cm TAPSE (M-mode): 1.5 cm LEFT ATRIUM             Index       RIGHT ATRIUM           Index LA diam:        4.50 cm 1.99 cm/m  RA Area:     17.30 cm LA Vol (A2C):   75.5 ml 33.33 ml/m RA Volume:   41.10 ml  18.14 ml/m LA Vol (A4C):   47.7 ml 21.06 ml/m LA Biplane Vol: 60.0 ml 26.49 ml/m  AORTIC VALVE LVOT Vmax:   112.00 cm/s LVOT Vmean:  78.100 cm/s LVOT VTI:    0.191 m  AORTA Ao Root diam: 4.00 cm Ao Asc diam:  4.00 cm MITRAL VALVE                 TRICUSPID VALVE MV Area (PHT): 4.33 cm      TR Peak grad:   28.5 mmHg MV Peak grad:  14.1 mmHg     TR Vmax:        267.00 cm/s MV Mean grad:  4.0 mmHg MV Vmax:       1.88 m/s      SHUNTS MV Vmean:  75.8 cm/s     Systemic VTI:  0.19 m MV Decel Time: 175 msec      Systemic Diam: 2.10 cm MR Peak grad:    114.1 mmHg MR Mean grad:    64.0 mmHg MR Vmax:         534.00 cm/s MR Vmean:        354.0 cm/s MR PISA:         1.01 cm MR PISA Eff ROA: 7 mm MR PISA Radius:  0.40 cm MV E velocity: 131.33 cm/s Gwyndolyn Kaufman MD Electronically signed by Gwyndolyn Kaufman MD Signature Date/Time:  02/06/2020/12:32:56 PM    Final     Assessment/Plan  1. Atrial fibrillation with rapid ventricular response (HCC) -  S/P Cardioversion on 02/06/20 - apixaban (ELIQUIS) 5 MG TABS tablet; Take 1 tablet (5 mg total) by mouth 2 (two) times daily.  Dispense: 60 tablet; Refill: 0 - metoprolol succinate (TOPROL-XL) 25 MG 24 hr tablet; Take 3 tablets (75 mg total) by mouth daily.  Dispense: 90 tablet; Refill: 0  2. Hypomagnesemia -  Magnesiun level 1.1, 02/12/20 - magnesium oxide (MAG-OX) 400 (241.3 Mg) MG tablet; Take 1 tablet (400 mg total) by mouth daily.  Dispense: 30 tablet; Refill: 0  3. Normochromic normocytic anemia Lab Results  Component Value Date   HGB 10.0 (L) 02/05/2020   - ferrous sulfate 325 (65 FE) MG tablet; Take 1 tablet (325 mg total) by mouth 2 (two) times daily with a meal.  Dispense: 60 tablet; Refill: 0  4.AKI Lab Results  Component Value Date   NA 132 (L) 02/07/2020   K 3.1 (L) 02/07/2020   CO2 25 02/07/2020   GLUCOSE 150 (H) 02/07/2020   BUN 7 (L) 02/07/2020   CREATININE 0.86 02/07/2020   CALCIUM 8.9 02/07/2020   GFRNONAA >60 02/07/2020   GFRAA >60 02/07/2020   -  02/07/20 glucose 136, calcium 9.4, creatinine 1.24, BUN 20.5, sodium 138, K3.8, GFR 55.15 -Avoid nephrotoxic medications  5. History of GI bleed - pantoprazole (PROTONIX) 40 MG tablet; Take 1 tablet (40 mg total) by mouth daily.  Dispense: 30 tablet; Refill: 0  6. Hypokalemia - K 3.8, 02/12/20 - potassium chloride SA (KLOR-CON) 20 MEQ tablet; Take 2 tablets (40 mEq total) by mouth daily.  Dispense: 60 tablet; Refill: 0  7. Protein-calorie malnutrition, unspecified severity ([place]) - albumin 2.9, was started on Pro-stat 30 ml daily for protein supplement  8. Weakness -  For home health PT and OT, for therapeutic and strengthening exercises -   Fall precautions  9. Other hyperlipidemia Lab Results  Component Value Date   CHOL 81 02/28/2019   HDL 47 02/28/2019   LDLCALC 29 02/28/2019   TRIG  27 02/28/2019   CHOLHDL 1.7 02/28/2019   - atorvastatin (LIPITOR) 20 MG tablet; Take 1 tablet (20 mg total) by mouth daily.  Dispense: 30 tablet; Refill: 0  10. Major depression, recurrent, chronic (HCC) - escitalopram (LEXAPRO) 10 MG tablet; Take 1 tablet (10 mg total) by mouth daily.  Dispense: 30 tablet; Refill: 0  11. Primary insomnia - traZODone (DESYREL) 50 MG tablet; Take 1 tablet (50 mg total) by mouth at bedtime.  Dispense: 30 tablet; Refill: 0      I have filled out patient's discharge paperwork and written prescriptions.  Patient will have home health PT and OT.  DME provided: None  Total discharge time: Greater than 30 minutes Greater than 50% was spent in counseling and coordination of care.   Discharge  time involved coordination of the discharge process with Education officer, museum, nursing staff and therapy department. Medical justification for home health services verified.   Durenda Age, DNP, MSN, FNP-BC [facility] and [facility] Medicine [phone] (Monday-Friday 8:00 a.m. - 5:00 p.m.) [phone] (after hours)

## 2020-02-18 ENCOUNTER — Encounter: Payer: Self-pay | Admitting: Physician Assistant

## 2020-02-18 ENCOUNTER — Ambulatory Visit (INDEPENDENT_AMBULATORY_CARE_PROVIDER_SITE_OTHER): Payer: Medicare Other | Admitting: Physician Assistant

## 2020-02-18 ENCOUNTER — Other Ambulatory Visit: Payer: Self-pay

## 2020-02-18 VITALS — BP 82/54 | HR 111 | Ht 72.0 in | Wt 237.8 lb

## 2020-02-18 DIAGNOSIS — I480 Paroxysmal atrial fibrillation: Secondary | ICD-10-CM | POA: Diagnosis not present

## 2020-02-18 DIAGNOSIS — R001 Bradycardia, unspecified: Secondary | ICD-10-CM | POA: Diagnosis not present

## 2020-02-18 DIAGNOSIS — E7849 Other hyperlipidemia: Secondary | ICD-10-CM | POA: Diagnosis not present

## 2020-02-18 DIAGNOSIS — E876 Hypokalemia: Secondary | ICD-10-CM | POA: Diagnosis not present

## 2020-02-18 DIAGNOSIS — I4819 Other persistent atrial fibrillation: Secondary | ICD-10-CM

## 2020-02-18 DIAGNOSIS — I10 Essential (primary) hypertension: Secondary | ICD-10-CM

## 2020-02-18 DIAGNOSIS — I5033 Acute on chronic diastolic (congestive) heart failure: Secondary | ICD-10-CM

## 2020-02-18 MED ORDER — FLECAINIDE ACETATE 100 MG PO TABS: 100.0000 mg | ORAL_TABLET | Freq: Two times a day (BID) | ORAL | 3 refills | Status: DC

## 2020-02-18 MED ORDER — METOPROLOL SUCCINATE ER 50 MG PO TB24: 50.0000 mg | ORAL_TABLET | Freq: Every day | ORAL | 3 refills | Status: DC

## 2020-02-18 NOTE — Progress Notes (Signed)
Cardiology Office Note:    Date:  02/18/2020   ID:  Darryl Diaz, DOB 1941/03/13, MRN [id number]  PCP:  [name], MD  [facility] Cardiologist:  Sherren Mocha, MD  [facility] Electrophysiologist:  Virl Axe, MD   Chief Complaint: Hospital follow up   History of Present Illness:    Darryl Diaz is a 79 y.o. male with a hx of  with history of paroxysmal atrial fibrillation on Eliquis,, GI bleed secondary to Mallory-Weiss tear,bradycardic arrest in December 2020 secondary to medication side effect(metoprolol, Cardizem, amiodarone with grapefruit juice),anemia, GERD, and OSA presents for hospital follow up.   Admitted on 02/05/2020 with weakness and fatigue and found to have atrial fibrillation with RVR. Started on IV cardizem.  Compliant with Eliquis. Underwent successful cardioversion. Maintained sinus rhythm. Echo showed normal LVEF.  PT/OT recommended SNF. Supplement given for low K and Mg.   He was in afib when seen by Dr. Linna Darner at [facility]. HR was 134 on EKG. He came back home from facility 10/9.  Here today for follow up. Feels fatigue and tired. BP low during clinic visit 82/54, no palpitations HR in 110s. No chest pain, palpitations, dizziness, orthopnea and PNd. Takes lasix PRN for LE edema, last took 2 days ago. Reports compliance with medications.    Past Medical History:  Diagnosis Date  . 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 Anemia   . Bradycardia    low 20's  . Cardiac arrest ([place]) 04/20/2019   12 min CPR with epi  . Dysrhythmia   . Fall at home 03/28/2016  . Fatty liver   . GERD (gastroesophageal reflux disease)   . H/O seasonal allergies   . History of GI bleed   . Hx of adenomatous colonic polyps 08/10/2017  . Hyperlipidemia   . Hypertension   . Insomnia   . 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 MI (myocardial infarction) ([place]) 04/2019   had CPR by EMS  . OA  (osteoarthritis)    hands  . OSA (obstructive sleep apnea)    No longer uses CPAP  . Persistent atrial fibrillation with rapid ventricular response ([place]) 02/05/2020    Past Surgical History:  Procedure Laterality Date  . ANKLE SURGERY Left 2014   had rods put in   . BIOPSY  02/28/2019   Procedure: BIOPSY;  Surgeon: Milus Banister, MD;  Location: [facility] ENDOSCOPY;  Service: Endoscopy;;  . CARDIOVERSION  02/06/2020  . CARDIOVERSION N/A 02/06/2020   Procedure: CARDIOVERSION;  Surgeon: Werner Lean, MD;  Location: [place];  Service: Cardiovascular;  Laterality: N/A;  . [place]  . ESOPHAGOGASTRODUODENOSCOPY (EGD) WITH PROPOFOL N/A 02/28/2019   Procedure: ESOPHAGOGASTRODUODENOSCOPY (EGD) WITH PROPOFOL;  Surgeon: Milus Banister, MD;  Location: [facility] ENDOSCOPY;  Service: Endoscopy;  Laterality: N/A;  . HIP ARTHROPLASTY Left 04/01/2016   Procedure: ARTHROPLASTY BIPOLAR HIP (HEMIARTHROPLASTY);  Surgeon: Paralee Cancel, MD;  Location: [place];  Service: Orthopedics;  Laterality: Left;  . HIP CLOSED REDUCTION Left 04/30/2016   Procedure: CLOSED REDUCTION HIP;  Surgeon: Wylene Simmer, MD;  Location: [place];  Service: Orthopedics;  Laterality: Left;  . TONSILLECTOMY    . TOTAL HIP ARTHROPLASTY Right 10/15/2019   Procedure: TOTAL HIP ARTHROPLASTY ANTERIOR APPROACH;  Surgeon: Paralee Cancel, MD;  Location: [facility];  Service: Orthopedics;  Laterality: Right;  70 mins    Current Medications: Current Meds  Medication  Sig  . apixaban (ELIQUIS) 5 MG TABS tablet Take 1 tablet (5 mg total) by mouth 2 (two) times daily.  Marland Kitchen atorvastatin (LIPITOR) 20 MG tablet Take 1 tablet (20 mg total) by mouth daily.  Marland Kitchen escitalopram (LEXAPRO) 10 MG tablet Take 1 tablet (10 mg total) by mouth daily.  . ferrous sulfate 325 (65 FE) MG tablet Take 1 tablet (325 mg total) by mouth 2 (two) times daily with a meal.  . folic acid (FOLVITE) 1 MG tablet Take 1 tablet (1 mg total) by mouth daily.  . furosemide  (LASIX) 40 MG tablet Take 1 tablet (40 mg total) by mouth daily as needed for fluid (weight gain).  Marland Kitchen loratadine (CLARITIN) 10 MG tablet Take 1 tablet (10 mg total) by mouth daily.  . magnesium oxide (MAG-OX) 400 (241.3 Mg) MG tablet Take 1 tablet (400 mg total) by mouth daily.  . Multiple Vitamin (MULTIVITAMIN WITH MINERALS) TABS tablet Take 1 tablet by mouth daily.  . pantoprazole (PROTONIX) 40 MG tablet Take 1 tablet (40 mg total) by mouth daily.  . potassium chloride SA (KLOR-CON) 20 MEQ tablet Take 2 tablets (40 mEq total) by mouth daily.  . Thiamine HCl (VITAMIN B-1) 250 MG tablet Take 1 tablet (250 mg total) by mouth daily.  . traZODone (DESYREL) 50 MG tablet Take 1 tablet (50 mg total) by mouth at bedtime.  . [DISCONTINUED] metoprolol succinate (TOPROL-XL) 25 MG 24 hr tablet Take 3 tablets (75 mg total) by mouth daily.     Allergies:   Penicillins   Social History   Socioeconomic History  . Marital status: Widowed    Spouse name: Not on file  . Number of children: 2  . Years of education: college  . Highest education level: Not on file  Occupational History  . Occupation: retired  Tobacco Use  . Smoking status: Former Smoker    Types: Cigarettes  . Smokeless tobacco: Never Used  . Tobacco comment:    quit 20 years  smoked on and off for 20 years  Vaping Use  . Vaping Use: Never used  Substance and Sexual Activity  . Alcohol use: No    Comment: 03/2016   i HAD ONE DRINK & THAT WAS THE FIRST DRINK i HAD IN A LONG TIME   . Drug use: No  . Sexual activity: Not Currently  Other Topics Concern  . Not on file  Social History Narrative   The patient is divorced w/ 1 son 1 daughter   He is a retired Regulatory affairs officer, he lived in [place] but [employer] including windows on the world in the [place] room in [place]   He moved to [place] to be near his sister who lives in [place] alcoholic member of AA   2 caffeinated beverages daily  prior smoker no drugs   Social Determinants of Radio broadcast assistant Strain:   . Difficulty of Paying Living Expenses: Not on file  Food Insecurity:   . Worried About Charity fundraiser in the Last Year: Not on file  . Ran Out of Food in the Last Year: Not on file  Transportation Needs:   . Lack of Transportation (Medical): Not on file  . Lack of Transportation (Non-Medical): Not on file  Physical Activity:   . Days of Exercise per Week: Not on file  . Minutes of Exercise per Session: Not on file  Stress:   . Feeling of Stress : Not  on file  Social Connections:   . Frequency of Communication with Friends and Family: Not on file  . Frequency of Social Gatherings with Friends and Family: Not on file  . Attends Religious Services: Not on file  . Active Member of Clubs or Organizations: Not on file  . Attends Archivist Meetings: Not on file  . Marital Status: Not on file     Family History: The patient's family history includes Arthritis in his mother; Heart disease (age of onset: 37) in his mother; Heart failure in his sister; Heart failure (age of onset: 82) in his father; Hypertension in his mother; Stroke in his maternal aunt. There is no history of Colon cancer, Esophageal cancer, Stomach cancer, or Rectal cancer.    ROS:   Please see the history of present illness.    All other systems reviewed and are negative.   EKGs/Labs/Other Studies Reviewed:    The following studies were reviewed today:  Echo 02/06/20 1. Left ventricular ejection fraction, by estimation, is 60 to 65%. The  left ventricle has normal function. The left ventricle has no regional  wall motion abnormalities. Left ventricular diastolic parameters are  indeterminate.  2. There is mild hyperkinesis of the RV apex relative to the RV free  wall. Recommend clinical correlation if concern for underlying pulmonary  embolism.   . Right ventricular systolic function is low normal. The right    ventricular size is mildly enlarged. There is mildly elevated pulmonary  artery systolic pressure.  3. Left atrial size was mildly dilated.  4. The mitral valve is normal in structure. Suspect at least mild,  eccentric mitral regurgitation secondary to tethering of the posterior  mitral leaflet. mitral valve regurgitation.  5. The aortic valve is tricuspid. There is mild calcification of the  aortic valve. There is moderate thickening of the aortic valve. Aortic  valve regurgitation is not visualized. Mild to moderate aortic valve  sclerosis/calcification is present, without  any evidence of aortic stenosis.  6. Aortic dilatation noted. There is mild dilatation of the ascending  aorta, measuring 40 mm. There is mild dilatation of the aortic root,  measuring 39 mm.  7. The inferior vena cava is dilated in size with <50% respiratory  variability, suggesting right atrial pressure of 15 mmHg.   Cardioversion 02/06/20 Procedure Details  The patient was NPO after midnight. Anesthesia was administered at the beside by Dr.Stoltzfuswith mg of propofol. Cardioversion was done with synchronized biphasic defibrillation with AP pads with 200 J. The patient converted to normal sinus rhythm. The patient tolerated the procedure well, thought had a brief run of atrial fibrillation that spontaneously converted.  IMPRESSION:  Successful cardioversion of atrial fibrillation  EKG:  EKG is  ordered today.  The ekg ordered today demonstrates Afib at 111 bpm  Recent Labs: 02/05/2020: Hemoglobin 10.0; Platelets 116 02/06/2020: ALT 36; B Natriuretic Peptide 381.9; TSH 1.764 02/07/2020: BUN 7; Creatinine, Ser 0.86; Magnesium 1.2; Potassium 3.1; Sodium 132  Recent Lipid Panel    Component Value Date/Time   CHOL 81 02/28/2019 0314   TRIG 27 02/28/2019 0314   HDL 47 02/28/2019 0314   CHOLHDL 1.7 02/28/2019 0314   VLDL 5 02/28/2019 0314   LDLCALC 29 02/28/2019 0314    Physical Exam:    VS:   BP (!) 82/54   Pulse (!) 111   Ht 6' (1.829 m)   Wt 237 lb 12.8 oz (107.9 kg)   SpO2 96%   BMI 32.25 kg/m  Wt Readings from Last 3 Encounters:  02/18/20 237 lb 12.8 oz (107.9 kg)  02/12/20 224 lb 14.4 oz (102 kg)  02/10/20 224 lb 14.4 oz (102 kg)     GEN: Well nourished, well developed in no acute distress HEENT: Normal NECK: No JVD; No carotid bruits LYMPHATICS: No lymphadenopathy CARDIAC: IR IR, no murmurs, rubs, gallops RESPIRATORY:  Clear to auscultation without rales, wheezing or rhonchi  ABDOMEN: Soft, non-tender, non-distended MUSCULOSKELETAL:  Trace edema; No deformity  SKIN: Warm and dry NEUROLOGIC:  Alert and oriented x 3 PSYCHIATRIC:  Normal affect   ASSESSMENT AND PLAN:    1. Persistent atrial fibrillation  - Had successful cardioversion 2 weeks ago but went in afib RVR at facility. HR today stable at 100-110s. His main complains is fatigue and tiredness. On Toprol XL 75mg  BID. Prior hx of bradycardic arrest in 2020 which felt secondary to medication side effect(metoprolol, Cardizem, amiodarone with grapefruit juice). - HR relatively stable today. Discussed with Dr. Burt Knack who this patient may need pacemaker +- antiarrhythmic for tachy-brady syndrome. Discussed with Dr. Caryl Comes start Flecainide 100mg  BID. Plan cardioversion next week then follow up in afib clinic and arrange myoview.   2. Chronic diastolic CHF - Takes lasix PRN. Last used 2 days ago.   3. Hypokalemia/Hypomegnesimia - Both were low in hospital. Unsure if this has checked at facility or not. He is on supplement.  - Will check labs - Keep Mg above 2 and K above 4  Medication Adjustments/Labs and Tests Ordered: Current medicines are reviewed at length with the patient today.  Concerns regarding medicines are outlined above.  Orders Placed This Encounter  Procedures  . Magnesium  . Basic metabolic panel  . CBC  . EKG 12-Lead   Meds ordered this encounter  Medications  . flecainide  (TAMBOCOR) 100 MG tablet    Sig: Take 1 tablet (100 mg total) by mouth 2 (two) times daily.    Dispense:  180 tablet    Refill:  3  . metoprolol succinate (TOPROL-XL) 50 MG 24 hr tablet    Sig: Take 1 tablet (50 mg total) by mouth daily. Take with or immediately following a meal.    Dispense:  90 tablet    Refill:  3    Patient Instructions  Medication Instructions:  Your physician has recommended you make the following change in your medication:  1.  REDUCE the Toprol XL to 50 mg daily  2.  START Flecainide 100 mg taking 1 tablet twice a day    *If you need a refill on your cardiac medications before your next appointment, please call your pharmacy*   Lab Work: TODAY:  MAGNESIUM, BMET, & CBC  If you have labs (blood work) drawn today and your tests are completely normal, you will receive your results only by: Marland Kitchen MyChart Message (if you have MyChart) OR . A paper copy in the mail If you have any lab test that is abnormal or we need to change your treatment, we will call you to review the results.   Testing/Procedures: Your physician has recommended that you have a Cardioversion (DCCV). Electrical Cardioversion uses a jolt of electricity to your heart either through paddles or wired patches attached to your chest. This is a controlled, usually prescheduled, procedure. Defibrillation is done under light anesthesia in the hospital, and you usually go home the day of the procedure. This is done to get your heart back into a normal rhythm. You are not awake for the  procedure. Please see the instruction sheet given to BELOW:   Dear Mr. Beulah Matusek are scheduled for a Cardioversion on 02/24/20 with Dr. Gasper Sells .  Please arrive at the [facility] (Main Entrance A) at [facility]: [address] [place], [place] [address] at 9:30 am   DIET: Nothing to eat or drink after midnight except a sip of water with medications (see medication instructions below)  Medication  Instructions: Hold LASIX ON THE MORNING OF   Continue your anticoagulant:  ELIQUIS  DO NOT MISS A DOSE You will need to continue your anticoagulant after your procedure until you  are told by your  Provider that it is safe to stop   Labs: If patient is on Coumadin, patient needs pt/INR, CBC, BMET within 3 days (No pt/INR needed for patients taking Xarelto, Eliquis, Pradaxa) For patients receiving anesthesia for TEE and all Cardioversion patients: BMET, CBC within 1 week  WE WILL DRAW TODAY  GO TO:   Due to recent COVID-19 restrictions implemented by our local and state authorities and in an effort to keep both patients and staff as safe as possible, our hospital system requires COVID-19 testing prior to certain scheduled hospital procedures.  Please go to [facility]. [place], [place] [address] on 01/23/20 at 11:30 am  .  This is a drive up testing site.  You will not need to exit your vehicle.  You will not be billed at the time of testing but may receive a bill later depending on your insurance. You must agree to self-quarantine from the time of your testing until the procedure date on 01/25/20.  This should included staying home with ONLY the people you live with.  Avoid take-out, grocery store shopping or leaving the house for any non-emergent reason.  Failure to have your COVID-19 test done on the date and time you have been scheduled will result in cancellation of your procedure.  Please call our office at [phone] if you have any questions.    Bring your insurance cards.  *Special Note: Every effort is made to have your procedure done on time. Occasionally there are emergencies that occur at the hospital that may cause delays. Please be patient if a delay does occur.      Follow-Up: At [facility], you and your health needs are our priority.  As part of our continuing mission to provide you with exceptional heart care, we have created designated Provider Care Teams.  These  Care Teams include your primary Cardiologist (physician) and Advanced Practice Providers (APPs -  Physician Assistants and Nurse Practitioners) who all work together to provide you with the care you need, when you need it.  We recommend signing up for the patient portal called "MyChart".  Sign up information is provided on this After Visit Summary.  MyChart is used to connect with patients for Virtual Visits (Telemedicine).  Patients are able to view lab/test results, encounter notes, upcoming appointments, etc.  Non-urgent messages can be sent to your provider as well.   To learn more about what you can do with MyChart, go to [email or web address].    Your next appointment:   [place] (02/24/20) IN THE AFIB CLINIC   The format for your next appointment:   In Person  Provider:   Afib Clinic    [place] Longest [place], [place]  02/18/2020 4:22 PM    [place]

## 2020-02-18 NOTE — Patient Instructions (Addendum)
Medication Instructions:  Your physician has recommended you make the following change in your medication:  1.  REDUCE the Toprol XL to 50 mg daily  2.  START Flecainide 100 mg taking 1 tablet twice a day    *If you need a refill on your cardiac medications before your next appointment, please call your pharmacy*   Lab Work: TODAY:  MAGNESIUM, BMET, & CBC  If you have labs (blood work) drawn today and your tests are completely normal, you will receive your results only by: Marland Kitchen MyChart Message (if you have MyChart) OR . A paper copy in the mail If you have any lab test that is abnormal or we need to change your treatment, we will call you to review the results.   Testing/Procedures: Your physician has recommended that you have a Cardioversion (DCCV). Electrical Cardioversion uses a jolt of electricity to your heart either through paddles or wired patches attached to your chest. This is a controlled, usually prescheduled, procedure. Defibrillation is done under light anesthesia in the hospital, and you usually go home the day of the procedure. This is done to get your heart back into a normal rhythm. You are not awake for the procedure. Please see the instruction sheet given to BELOW:   Dear Darryl Diaz are scheduled for a Cardioversion on 02/24/20 with Dr. Gasper Sells .  Please arrive at the [facility] (Main Entrance A) at [facility]: [address] [place], [place] [address] at 9:30 am   DIET: Nothing to eat or drink after midnight except a sip of water with medications (see medication instructions below)  Medication Instructions: Hold LASIX ON THE MORNING OF   Continue your anticoagulant:  ELIQUIS  DO NOT MISS A DOSE You will need to continue your anticoagulant after your procedure until you  are told by your  Provider that it is safe to stop   Labs: If patient is on Coumadin, patient needs pt/INR, CBC, BMET within 3 days (No pt/INR needed for patients taking  Xarelto, Eliquis, Pradaxa) For patients receiving anesthesia for TEE and all Cardioversion patients: BMET, CBC within 1 week  WE WILL DRAW TODAY  GO TO:   Due to recent COVID-19 restrictions implemented by our local and state authorities and in an effort to keep both patients and staff as safe as possible, our hospital system requires COVID-19 testing prior to certain scheduled hospital procedures.  Please go to [address]. [place], [place] [address] on 01/23/20 at 11:30 am  .  This is a drive up testing site.  You will not need to exit your vehicle.  You will not be billed at the time of testing but may receive a bill later depending on your insurance. You must agree to self-quarantine from the time of your testing until the procedure date on 01/25/20.  This should included staying home with ONLY the people you live with.  Avoid take-out, grocery store shopping or leaving the house for any non-emergent reason.  Failure to have your COVID-19 test done on the date and time you have been scheduled will result in cancellation of your procedure.  Please call our office at [phone] if you have any questions.    Bring your insurance cards.  *Special Note: Every effort is made to have your procedure done on time. Occasionally there are emergencies that occur at the hospital that may cause delays. Please be patient if a delay does occur.      Follow-Up: At  [facility], you and your health needs are our priority.  As part of our continuing mission to provide you with exceptional heart care, we have created designated Provider Care Teams.  These Care Teams include your primary Cardiologist (physician) and Advanced Practice Providers (APPs -  Physician Assistants and Nurse Practitioners) who all work together to provide you with the care you need, when you need it.  We recommend signing up for the patient portal called "MyChart".  Sign up information is provided on this After Visit Summary.   MyChart is used to connect with patients for Virtual Visits (Telemedicine).  Patients are able to view lab/test results, encounter notes, upcoming appointments, etc.  Non-urgent messages can be sent to your provider as well.   To learn more about what you can do with MyChart, go to [email or web address].    Your next appointment:   [name] (02/24/20) IN THE AFIB CLINIC   The format for your next appointment:   In Person  Provider:   [facility]

## 2020-02-19 DIAGNOSIS — I4891 Unspecified atrial fibrillation: Secondary | ICD-10-CM | POA: Diagnosis not present

## 2020-02-19 DIAGNOSIS — I480 Paroxysmal atrial fibrillation: Secondary | ICD-10-CM | POA: Diagnosis not present

## 2020-02-19 DIAGNOSIS — R531 Weakness: Secondary | ICD-10-CM | POA: Diagnosis not present

## 2020-02-19 DIAGNOSIS — N289 Disorder of kidney and ureter, unspecified: Secondary | ICD-10-CM | POA: Diagnosis not present

## 2020-02-19 DIAGNOSIS — M6282 Rhabdomyolysis: Secondary | ICD-10-CM | POA: Diagnosis not present

## 2020-02-19 DIAGNOSIS — G4700 Insomnia, unspecified: Secondary | ICD-10-CM | POA: Diagnosis not present

## 2020-02-19 DIAGNOSIS — M452 Ankylosing spondylitis of cervical region: Secondary | ICD-10-CM | POA: Diagnosis not present

## 2020-02-19 DIAGNOSIS — I119 Hypertensive heart disease without heart failure: Secondary | ICD-10-CM | POA: Diagnosis not present

## 2020-02-19 DIAGNOSIS — M47812 Spondylosis without myelopathy or radiculopathy, cervical region: Secondary | ICD-10-CM | POA: Diagnosis not present

## 2020-02-19 DIAGNOSIS — M79672 Pain in left foot: Secondary | ICD-10-CM | POA: Diagnosis not present

## 2020-02-19 DIAGNOSIS — M4312 Spondylolisthesis, cervical region: Secondary | ICD-10-CM | POA: Diagnosis not present

## 2020-02-19 DIAGNOSIS — D649 Anemia, unspecified: Secondary | ICD-10-CM | POA: Diagnosis not present

## 2020-02-19 DIAGNOSIS — M79671 Pain in right foot: Secondary | ICD-10-CM | POA: Diagnosis not present

## 2020-02-19 DIAGNOSIS — I4819 Other persistent atrial fibrillation: Secondary | ICD-10-CM | POA: Diagnosis not present

## 2020-02-19 DIAGNOSIS — G4733 Obstructive sleep apnea (adult) (pediatric): Secondary | ICD-10-CM | POA: Diagnosis not present

## 2020-02-19 DIAGNOSIS — I088 Other rheumatic multiple valve diseases: Secondary | ICD-10-CM | POA: Diagnosis not present

## 2020-02-19 DIAGNOSIS — I259 Chronic ischemic heart disease, unspecified: Secondary | ICD-10-CM | POA: Diagnosis not present

## 2020-02-19 DIAGNOSIS — E441 Mild protein-calorie malnutrition: Secondary | ICD-10-CM | POA: Diagnosis not present

## 2020-02-19 DIAGNOSIS — F4321 Adjustment disorder with depressed mood: Secondary | ICD-10-CM | POA: Diagnosis not present

## 2020-02-19 LAB — BASIC METABOLIC PANEL
BUN/Creatinine Ratio: 12 (ref 10–24)
BUN: 13 mg/dL (ref 8–27)
CO2: 22 mmol/L (ref 20–29)
Calcium: 8.8 mg/dL (ref 8.6–10.2)
Chloride: 100 mmol/L (ref 96–106)
Creatinine, Ser: 1.06 mg/dL (ref 0.76–1.27)
GFR calc Af Amer: 77 mL/min/{1.73_m2} (ref >59)
GFR calc non Af Amer: 67 mL/min/{1.73_m2} (ref >59)
Glucose: 82 mg/dL (ref 65–99)
Potassium: 4.7 mmol/L (ref 3.5–5.2)
Sodium: 137 mmol/L (ref 134–144)

## 2020-02-19 LAB — CBC
Hematocrit: 30.8 % — ABNORMAL LOW (ref 37.5–51.0)
Hemoglobin: 10.5 g/dL — ABNORMAL LOW (ref 13.0–17.7)
MCH: 32.2 pg (ref 26.6–33.0)
MCHC: 34.1 g/dL (ref 31.5–35.7)
MCV: 95 fL (ref 79–97)
Platelets: 316 10*3/uL (ref 150–450)
RBC: 3.26 x10E6/uL — ABNORMAL LOW (ref 4.14–5.80)
RDW: 14.3 % (ref 11.6–15.4)
WBC: 7.1 10*3/uL (ref 3.4–10.8)

## 2020-02-19 LAB — MAGNESIUM: Magnesium: 1.2 mg/dL — ABNORMAL LOW (ref 1.6–2.3)

## 2020-02-20 ENCOUNTER — Telehealth: Payer: Self-pay | Admitting: Cardiovascular Disease

## 2020-02-20 MED ORDER — MAGNESIUM OXIDE 400 (241.3 MG) MG PO TABS: 400.0000 mg | ORAL_TABLET | Freq: Every day | ORAL | 3 refills | Status: DC

## 2020-02-20 NOTE — Telephone Encounter (Signed)
Follow up:     Patient returning a call from yesterday concerning some results. Please call patient.

## 2020-02-20 NOTE — Telephone Encounter (Signed)
-----   Message from [place], [place] sent at 02/19/2020  9:26 AM EDT ----- Renal function and potassium are normal. Magnesium is persistently low. Can you make sure he is taking his magnesium supplement? If he is taking, we need to review this with pharmacist about dosage.

## 2020-02-22 ENCOUNTER — Emergency Department (HOSPITAL_COMMUNITY): Payer: Medicare Other

## 2020-02-22 ENCOUNTER — Other Ambulatory Visit: Payer: Self-pay

## 2020-02-22 ENCOUNTER — Encounter (HOSPITAL_COMMUNITY): Payer: Self-pay | Admitting: Emergency Medicine

## 2020-02-22 ENCOUNTER — Inpatient Hospital Stay (HOSPITAL_COMMUNITY)
Admission: EM | Admit: 2020-02-22 | Discharge: 2020-03-06 | DRG: 308 | Disposition: A | Discharge status: Rehabilitation Facility | Payer: Medicare Other | Attending: Cardiology | Admitting: Cardiology

## 2020-02-22 ENCOUNTER — Other Ambulatory Visit (HOSPITAL_COMMUNITY): Admission: RE | Admit: 2020-02-22 | Payer: Medicare Other | Source: Ambulatory Visit

## 2020-02-22 DIAGNOSIS — Z8261 Family history of arthritis: Secondary | ICD-10-CM | POA: Diagnosis not present

## 2020-02-22 DIAGNOSIS — Z7901 Long term (current) use of anticoagulants: Secondary | ICD-10-CM | POA: Diagnosis not present

## 2020-02-22 DIAGNOSIS — Z8249 Family history of ischemic heart disease and other diseases of the circulatory system: Secondary | ICD-10-CM | POA: Diagnosis not present

## 2020-02-22 DIAGNOSIS — E785 Hyperlipidemia, unspecified: Secondary | ICD-10-CM | POA: Diagnosis not present

## 2020-02-22 DIAGNOSIS — Z8679 Personal history of other diseases of the circulatory system: Secondary | ICD-10-CM | POA: Diagnosis not present

## 2020-02-22 DIAGNOSIS — Z87891 Personal history of nicotine dependence: Secondary | ICD-10-CM

## 2020-02-22 DIAGNOSIS — T7840XA Allergy, unspecified, initial encounter: Secondary | ICD-10-CM | POA: Diagnosis present

## 2020-02-22 DIAGNOSIS — M19042 Primary osteoarthritis, left hand: Secondary | ICD-10-CM | POA: Diagnosis not present

## 2020-02-22 DIAGNOSIS — R2689 Other abnormalities of gait and mobility: Secondary | ICD-10-CM | POA: Diagnosis present

## 2020-02-22 DIAGNOSIS — K219 Gastro-esophageal reflux disease without esophagitis: Secondary | ICD-10-CM | POA: Diagnosis not present

## 2020-02-22 DIAGNOSIS — Z79899 Other long term (current) drug therapy: Secondary | ICD-10-CM

## 2020-02-22 DIAGNOSIS — Z880 Allergy status to penicillin: Secondary | ICD-10-CM | POA: Diagnosis not present

## 2020-02-22 DIAGNOSIS — G4700 Insomnia, unspecified: Secondary | ICD-10-CM | POA: Diagnosis present

## 2020-02-22 DIAGNOSIS — M19041 Primary osteoarthritis, right hand: Secondary | ICD-10-CM | POA: Diagnosis present

## 2020-02-22 DIAGNOSIS — I110 Hypertensive heart disease with heart failure: Secondary | ICD-10-CM | POA: Diagnosis present

## 2020-02-22 DIAGNOSIS — D649 Anemia, unspecified: Secondary | ICD-10-CM | POA: Diagnosis present

## 2020-02-22 DIAGNOSIS — Z823 Family history of stroke: Secondary | ICD-10-CM | POA: Diagnosis not present

## 2020-02-22 DIAGNOSIS — I4819 Other persistent atrial fibrillation: Principal | ICD-10-CM | POA: Diagnosis present

## 2020-02-22 DIAGNOSIS — G4733 Obstructive sleep apnea (adult) (pediatric): Secondary | ICD-10-CM | POA: Diagnosis present

## 2020-02-22 DIAGNOSIS — I472 Ventricular tachycardia: Secondary | ICD-10-CM | POA: Diagnosis present

## 2020-02-22 DIAGNOSIS — F1021 Alcohol dependence, in remission: Secondary | ICD-10-CM | POA: Diagnosis present

## 2020-02-22 DIAGNOSIS — I480 Paroxysmal atrial fibrillation: Secondary | ICD-10-CM | POA: Diagnosis not present

## 2020-02-22 DIAGNOSIS — R21 Rash and other nonspecific skin eruption: Secondary | ICD-10-CM | POA: Diagnosis present

## 2020-02-22 DIAGNOSIS — B372 Candidiasis of skin and nail: Secondary | ICD-10-CM | POA: Diagnosis present

## 2020-02-22 DIAGNOSIS — Z9119 Patient's noncompliance with other medical treatment and regimen: Secondary | ICD-10-CM | POA: Diagnosis not present

## 2020-02-22 DIAGNOSIS — E8809 Other disorders of plasma-protein metabolism, not elsewhere classified: Secondary | ICD-10-CM | POA: Diagnosis not present

## 2020-02-22 DIAGNOSIS — R5381 Other malaise: Secondary | ICD-10-CM | POA: Diagnosis not present

## 2020-02-22 DIAGNOSIS — E46 Unspecified protein-calorie malnutrition: Secondary | ICD-10-CM | POA: Diagnosis not present

## 2020-02-22 DIAGNOSIS — I5033 Acute on chronic diastolic (congestive) heart failure: Secondary | ICD-10-CM | POA: Diagnosis present

## 2020-02-22 DIAGNOSIS — I10 Essential (primary) hypertension: Secondary | ICD-10-CM | POA: Diagnosis not present

## 2020-02-22 DIAGNOSIS — I5032 Chronic diastolic (congestive) heart failure: Secondary | ICD-10-CM | POA: Diagnosis not present

## 2020-02-22 DIAGNOSIS — Z9181 History of falling: Secondary | ICD-10-CM

## 2020-02-22 DIAGNOSIS — K760 Fatty (change of) liver, not elsewhere classified: Secondary | ICD-10-CM | POA: Diagnosis present

## 2020-02-22 DIAGNOSIS — Z96643 Presence of artificial hip joint, bilateral: Secondary | ICD-10-CM | POA: Diagnosis present

## 2020-02-22 DIAGNOSIS — Z8674 Personal history of sudden cardiac arrest: Secondary | ICD-10-CM

## 2020-02-22 DIAGNOSIS — Z20822 Contact with and (suspected) exposure to covid-19: Secondary | ICD-10-CM | POA: Diagnosis present

## 2020-02-22 DIAGNOSIS — R55 Syncope and collapse: Secondary | ICD-10-CM | POA: Diagnosis present

## 2020-02-22 DIAGNOSIS — R000 Tachycardia, unspecified: Secondary | ICD-10-CM | POA: Diagnosis not present

## 2020-02-22 DIAGNOSIS — I4891 Unspecified atrial fibrillation: Secondary | ICD-10-CM | POA: Diagnosis not present

## 2020-02-22 DIAGNOSIS — I252 Old myocardial infarction: Secondary | ICD-10-CM | POA: Diagnosis not present

## 2020-02-22 DIAGNOSIS — D62 Acute posthemorrhagic anemia: Secondary | ICD-10-CM | POA: Diagnosis not present

## 2020-02-22 DIAGNOSIS — T7840XD Allergy, unspecified, subsequent encounter: Secondary | ICD-10-CM | POA: Diagnosis not present

## 2020-02-22 DIAGNOSIS — D638 Anemia in other chronic diseases classified elsewhere: Secondary | ICD-10-CM | POA: Diagnosis not present

## 2020-02-22 LAB — URINALYSIS, ROUTINE W REFLEX MICROSCOPIC
Bilirubin Urine: NEGATIVE
Glucose, UA: NEGATIVE mg/dL
Hgb urine dipstick: NEGATIVE
Ketones, ur: NEGATIVE mg/dL
Leukocytes,Ua: NEGATIVE
Nitrite: NEGATIVE
Protein, ur: NEGATIVE mg/dL
Specific Gravity, Urine: 1.005 (ref 1.005–1.030)
pH: 7 (ref 5.0–8.0)

## 2020-02-22 LAB — CBC
HCT: 35.4 % — ABNORMAL LOW (ref 39.0–52.0)
Hemoglobin: 11.2 g/dL — ABNORMAL LOW (ref 13.0–17.0)
MCH: 30.9 pg (ref 26.0–34.0)
MCHC: 31.6 g/dL (ref 30.0–36.0)
MCV: 97.8 fL (ref 80.0–100.0)
Platelets: 295 10*3/uL (ref 150–400)
RBC: 3.62 MIL/uL — ABNORMAL LOW (ref 4.22–5.81)
RDW: 15.1 % (ref 11.5–15.5)
WBC: 4.9 10*3/uL (ref 4.0–10.5)
nRBC: 0 % (ref 0.0–0.2)

## 2020-02-22 LAB — BASIC METABOLIC PANEL
Anion gap: 14 (ref 5–15)
BUN: 10 mg/dL (ref 8–23)
CO2: 24 mmol/L (ref 22–32)
Calcium: 8.3 mg/dL — ABNORMAL LOW (ref 8.9–10.3)
Chloride: 98 mmol/L (ref 98–111)
Creatinine, Ser: 1.17 mg/dL (ref 0.61–1.24)
GFR, Estimated: 59 mL/min — ABNORMAL LOW (ref >60)
Glucose, Bld: 80 mg/dL (ref 70–99)
Potassium: 3.5 mmol/L (ref 3.5–5.1)
Sodium: 136 mmol/L (ref 135–145)

## 2020-02-22 LAB — RESPIRATORY PANEL BY RT PCR (FLU A&B, COVID)
Influenza A by PCR: NEGATIVE
Influenza B by PCR: NEGATIVE
SARS Coronavirus 2 by RT PCR: NEGATIVE

## 2020-02-22 LAB — BRAIN NATRIURETIC PEPTIDE: B Natriuretic Peptide: 266.2 pg/mL — ABNORMAL HIGH (ref 0.0–100.0)

## 2020-02-22 LAB — TROPONIN I (HIGH SENSITIVITY)
Troponin I (High Sensitivity): 5 ng/L (ref <18)
Troponin I (High Sensitivity): 5 ng/L (ref <18)

## 2020-02-22 LAB — PROTIME-INR
INR: 1.2 (ref 0.8–1.2)
Prothrombin Time: 14.6 seconds (ref 11.4–15.2)

## 2020-02-22 LAB — D-DIMER, QUANTITATIVE: D-Dimer, Quant: 0.38 ug/mL-FEU (ref 0.00–0.50)

## 2020-02-22 LAB — CBG MONITORING, ED
Glucose-Capillary: 61 mg/dL — ABNORMAL LOW (ref 70–99)
Glucose-Capillary: 81 mg/dL (ref 70–99)

## 2020-02-22 LAB — MAGNESIUM: Magnesium: 1.9 mg/dL (ref 1.7–2.4)

## 2020-02-22 MED ORDER — THIAMINE HCL 100 MG PO TABS
250.0000 mg | ORAL_TABLET | Freq: Every day | ORAL | Status: DC
  Administered 2020-02-22 – 2020-03-05 (×12): 250 mg via ORAL
  Filled 2020-02-22 (×12): qty 3

## 2020-02-22 MED ORDER — PANTOPRAZOLE SODIUM 40 MG PO TBEC
40.0000 mg | DELAYED_RELEASE_TABLET | Freq: Every day | ORAL | Status: DC
  Administered 2020-02-23 – 2020-03-05 (×12): 40 mg via ORAL
  Filled 2020-02-22 (×13): qty 1

## 2020-02-22 MED ORDER — DILTIAZEM LOAD VIA INFUSION
10.0000 mg | Freq: Once | INTRAVENOUS | Status: AC
  Administered 2020-02-22: 10 mg via INTRAVENOUS
  Filled 2020-02-22: qty 10

## 2020-02-22 MED ORDER — FERROUS SULFATE 325 (65 FE) MG PO TABS
325.0000 mg | ORAL_TABLET | Freq: Two times a day (BID) | ORAL | Status: DC
  Administered 2020-02-22 – 2020-03-05 (×24): 325 mg via ORAL
  Filled 2020-02-22 (×23): qty 1

## 2020-02-22 MED ORDER — NITROGLYCERIN 0.4 MG SL SUBL: 0.4000 mg | SUBLINGUAL_TABLET | SUBLINGUAL | Status: DC | PRN

## 2020-02-22 MED ORDER — ATORVASTATIN CALCIUM 10 MG PO TABS
20.0000 mg | ORAL_TABLET | Freq: Every day | ORAL | Status: DC
  Administered 2020-02-22 – 2020-03-05 (×12): 20 mg via ORAL
  Filled 2020-02-22 (×12): qty 2

## 2020-02-22 MED ORDER — FOLIC ACID 1 MG PO TABS
1.0000 mg | ORAL_TABLET | Freq: Every day | ORAL | Status: DC
  Administered 2020-02-22 – 2020-03-05 (×12): 1 mg via ORAL
  Filled 2020-02-22 (×12): qty 1

## 2020-02-22 MED ORDER — LORATADINE 10 MG PO TABS
10.0000 mg | ORAL_TABLET | Freq: Every day | ORAL | Status: DC
  Administered 2020-02-22 – 2020-03-05 (×12): 10 mg via ORAL
  Filled 2020-02-22 (×12): qty 1

## 2020-02-22 MED ORDER — APIXABAN 5 MG PO TABS
5.0000 mg | ORAL_TABLET | Freq: Two times a day (BID) | ORAL | Status: DC
  Administered 2020-02-23 – 2020-03-05 (×25): 5 mg via ORAL
  Filled 2020-02-22 (×25): qty 1

## 2020-02-22 MED ORDER — FUROSEMIDE 10 MG/ML IJ SOLN
40.0000 mg | Freq: Two times a day (BID) | INTRAMUSCULAR | Status: DC
  Administered 2020-02-22 – 2020-02-23 (×2): 40 mg via INTRAVENOUS
  Filled 2020-02-22 (×2): qty 4

## 2020-02-22 MED ORDER — ACETAMINOPHEN 325 MG PO TABS
650.0000 mg | ORAL_TABLET | ORAL | Status: DC | PRN
  Administered 2020-02-23: 650 mg via ORAL
  Filled 2020-02-22: qty 2

## 2020-02-22 MED ORDER — ESCITALOPRAM OXALATE 10 MG PO TABS
10.0000 mg | ORAL_TABLET | Freq: Every day | ORAL | Status: DC
  Administered 2020-02-22 – 2020-03-05 (×12): 10 mg via ORAL
  Filled 2020-02-22 (×12): qty 1

## 2020-02-22 MED ORDER — DILTIAZEM HCL-DEXTROSE 125-5 MG/125ML-% IV SOLN (PREMIX)
5.0000 mg/h | INTRAVENOUS | Status: DC
  Administered 2020-02-22: 5 mg/h via INTRAVENOUS
  Administered 2020-02-23 – 2020-02-24 (×3): 15 mg/h via INTRAVENOUS
  Filled 2020-02-22 (×7): qty 125

## 2020-02-22 MED ORDER — SODIUM CHLORIDE 0.9% FLUSH: 3.0000 mL | Freq: Once | INTRAVENOUS | Status: DC

## 2020-02-22 MED ORDER — ONDANSETRON HCL 4 MG/2ML IJ SOLN: 4.0000 mg | Freq: Four times a day (QID) | INTRAMUSCULAR | Status: DC | PRN

## 2020-02-22 MED ORDER — POTASSIUM CHLORIDE CRYS ER 20 MEQ PO TBCR
60.0000 meq | EXTENDED_RELEASE_TABLET | Freq: Once | ORAL | Status: AC
  Administered 2020-02-22: 60 meq via ORAL
  Filled 2020-02-22: qty 3

## 2020-02-22 MED ORDER — MAGNESIUM SULFATE 2 GM/50ML IV SOLN
2.0000 g | Freq: Once | INTRAVENOUS | Status: AC
  Administered 2020-02-22: 2 g via INTRAVENOUS
  Filled 2020-02-22: qty 50

## 2020-02-22 MED ORDER — MICONAZOLE NITRATE POWD
Freq: Two times a day (BID) | Status: DC
  Filled 2020-02-22: qty 100

## 2020-02-22 NOTE — ED Notes (Signed)
Pt noted to be in afib rvr, ekg captured and given to provider. Pt denies cp, shob.

## 2020-02-22 NOTE — ED Provider Notes (Signed)
[facility] EMERGENCY DEPARTMENT Provider Note   CSN: [id number] Arrival date & time: 02/22/20  1128     History Chief Complaint  Patient presents with  . Loss of Consciousness    Darryl Diaz is a 79 y.o. male.  Darryl Diaz is a 79 y.o. male with a history of A. fib, MI, cardiac arrest, hypertension, hyperlipidemia GERD, depression, who presents to the emergency department for evaluation after syncopal episode.  Patient states that he was getting in his car to go get a preprocedural Covid test done for a cardioversion scheduled for next week.  Patient states that he got into his car did not feel any dyspnea, chest pain or palpitations on the walk to the car, sat down and went to put the keys in the ignition and then everything went black.  He denies any prodromal symptoms.  He states he woke up in his car and does not know how long he was unconscious but suspects it was seconds to minutes.  He reports when he woke up he felt fine, denied any chest pain or shortness of breath afterwards.  No headache, vision changes, numbness tingling or weakness.  EMS was called and when they arrived patient was found to be in A. fib with rates around 120.  Patient does note chronic lower extremity edema, which is slightly worse than usual. Patient denies any recent illness, no fevers or chills.  No urinary symptoms.  He states that he has been taking all of his medications regularly, he is anticoagulated on Eliquis.  He has not had his morning medicines today.        Past Medical History:  Diagnosis Date  . 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 Anemia   . Bradycardia    low 20's  . Cardiac arrest ([place]) 04/20/2019   12 min CPR with epi  . Dysrhythmia   . Fall at home 03/28/2016  . Fatty liver   . GERD (gastroesophageal reflux disease)   . H/O seasonal allergies   . History of GI bleed   . Hx of adenomatous colonic polyps 08/10/2017  .  Hyperlipidemia   . Hypertension   . Insomnia   . 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 MI (myocardial infarction) ([place]) 04/2019   had CPR by EMS  . OA (osteoarthritis)    hands  . OSA (obstructive sleep apnea)    No longer uses CPAP  . Persistent atrial fibrillation with rapid ventricular response ([place]) 02/05/2020    Patient Active Problem List   Diagnosis Date Noted  . AKI (acute kidney injury) ([place]) 02/10/2020  . Unspecified protein-calorie malnutrition ([place]) 02/10/2020  . Weakness 02/06/2020  . Persistent atrial fibrillation with rapid ventricular response ([place]) 02/05/2020  . 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 Bradycardia 04/20/2019  . Atrial fibrillation with rapid ventricular response ([place])   . GI bleed 02/25/2019  . Fall 02/25/2019  . Bilateral leg edema 02/25/2019  . Hyponatremia 02/25/2019  . Acute posthemorrhagic anemia 08/11/2018  . Normochromic normocytic anemia 08/11/2018  . Hx of adenomatous colonic polyps 08/10/2017  . PAF (paroxysmal atrial fibrillation) ([place]) 02/05/2017  . Dislocation of hip, posterior, left, closed ([place]) 04/30/2016  . Hip fracture ([place]) 03/30/2016  . Hypertension 03/30/2016  . Hyperlipidemia 03/30/2016  . GERD (gastroesophageal reflux disease) 03/30/2016  . Osteoarthritis 03/30/2016  . Depression 03/30/2016  . Rhabdomyolysis 03/30/2016  .  Leukocytosis 03/30/2016  . Pressure injury of skin 03/30/2016  . Closed fracture of left hip [facility])     Past Surgical History:  Procedure Laterality Date  . ANKLE SURGERY Left 2014   had rods put in   . BIOPSY  02/28/2019   Procedure: BIOPSY;  Surgeon: Milus Banister, MD;  Location: [facility] ENDOSCOPY;  Service: Endoscopy;;  . CARDIOVERSION  02/06/2020  . CARDIOVERSION N/A 02/06/2020   Procedure: CARDIOVERSION;  Surgeon: Werner Lean, MD;  Location: [facility];  Service: Cardiovascular;  Laterality: N/A;  . [place]  . ESOPHAGOGASTRODUODENOSCOPY (EGD) WITH PROPOFOL N/A 02/28/2019   Procedure: ESOPHAGOGASTRODUODENOSCOPY (EGD) WITH PROPOFOL;  Surgeon: Milus Banister, MD;  Location: [facility] ENDOSCOPY;  Service: Endoscopy;  Laterality: N/A;  . HIP ARTHROPLASTY Left 04/01/2016   Procedure: ARTHROPLASTY BIPOLAR HIP (HEMIARTHROPLASTY);  Surgeon: Paralee Cancel, MD;  Location: [facility];  Service: Orthopedics;  Laterality: Left;  . HIP CLOSED REDUCTION Left 04/30/2016   Procedure: CLOSED REDUCTION HIP;  Surgeon: Wylene Simmer, MD;  Location: [facility];  Service: Orthopedics;  Laterality: Left;  . TONSILLECTOMY    . TOTAL HIP ARTHROPLASTY Right 10/15/2019   Procedure: TOTAL HIP ARTHROPLASTY ANTERIOR APPROACH;  Surgeon: Paralee Cancel, MD;  Location: [facility];  Service: Orthopedics;  Laterality: Right;  70 mins       Family History  Problem Relation Age of Onset  . Heart disease Mother 61  . Hypertension Mother   . Arthritis Mother   . Heart failure Father 40  . Stroke Maternal Aunt   . Heart failure Sister   . Colon cancer Neg Hx   . Esophageal cancer Neg Hx   . Stomach cancer Neg Hx   . Rectal cancer Neg Hx     Social History   Tobacco Use  . Smoking status: Former Smoker    Types: Cigarettes  . Smokeless tobacco: Never Used  . Tobacco comment:    quit 20 years  smoked on and off for 20 years  Vaping Use  . Vaping Use: Never used  Substance Use Topics  . Alcohol use: No    Comment: 03/2016   i HAD ONE DRINK & THAT WAS THE FIRST DRINK i HAD IN A LONG TIME   . Drug use: No    Home Medications Prior to Admission medications   Medication Sig Start Date End Date Taking? Authorizing Provider  apixaban (ELIQUIS) 5 MG TABS tablet Take 1 tablet (5 mg total) by mouth 2 (two) times daily. 02/12/20   Medina-Vargas, Monina C, NP  atorvastatin (LIPITOR) 20 MG tablet Take 1 tablet (20 mg total) by mouth daily. 02/12/20   Medina-Vargas, Monina C, NP  escitalopram (LEXAPRO) 10 MG tablet Take 1 tablet (10 mg total)  by mouth daily. 02/12/20   Medina-Vargas, Monina C, NP  ferrous sulfate 325 (65 FE) MG tablet Take 1 tablet (325 mg total) by mouth 2 (two) times daily with a meal. 02/12/20 03/13/20  Medina-Vargas, Monina C, NP  flecainide (TAMBOCOR) 100 MG tablet Take 1 tablet (100 mg total) by mouth 2 (two) times daily. 02/18/20   Bhagat, Crista Luria, PA  folic acid (FOLVITE) 1 MG tablet Take 1 tablet (1 mg total) by mouth daily. 02/12/20   Medina-Vargas, Monina C, NP  furosemide (LASIX) 40 MG tablet Take 1 tablet (40 mg total) by mouth daily as needed for fluid (weight gain). 02/12/20   Medina-Vargas, Monina C, NP  loratadine (CLARITIN) 10 MG tablet Take 1 tablet (10  mg total) by mouth daily. 02/12/20   Medina-Vargas, Monina C, NP  magnesium oxide (MAG-OX) 400 (241.3 Mg) MG tablet Take 1 tablet (400 mg total) by mouth daily. 02/20/20   Bhagat, Crista Luria, PA  metoprolol succinate (TOPROL-XL) 50 MG 24 hr tablet Take 1 tablet (50 mg total) by mouth daily. Take with or immediately following a meal. 02/18/20 05/18/20  Bhagat, Crista Luria, PA  Multiple Vitamin (MULTIVITAMIN WITH MINERALS) TABS tablet Take 1 tablet by mouth daily.    [provider]  pantoprazole (PROTONIX) 40 MG tablet Take 1 tablet (40 mg total) by mouth daily. 02/12/20   Medina-Vargas, Monina C, NP  potassium chloride SA (KLOR-CON) 20 MEQ tablet Take 2 tablets (40 mEq total) by mouth daily. 02/12/20   Medina-Vargas, Monina C, NP  Thiamine HCl (VITAMIN B-1) 250 MG tablet Take 1 tablet (250 mg total) by mouth daily. 02/12/20   Medina-Vargas, Monina C, NP  traZODone (DESYREL) 50 MG tablet Take 1 tablet (50 mg total) by mouth at bedtime. 02/12/20   Medina-Vargas, Monina C, NP    Allergies    Penicillins  Review of Systems   Review of Systems  Constitutional: Negative for chills and fever.  HENT: Negative.   Eyes: Negative for visual disturbance.  Respiratory: Negative for cough and shortness of breath.   Cardiovascular: Negative for chest pain,  palpitations and leg swelling.  Gastrointestinal: Negative for abdominal pain, diarrhea, nausea and vomiting.  Genitourinary: Negative for dysuria.  Musculoskeletal: Negative for arthralgias and myalgias.  Skin: Negative for color change and rash.  Neurological: Positive for syncope. Negative for dizziness, weakness, light-headedness, numbness and headaches.  All other systems reviewed and are negative.   Physical Exam Updated Vital Signs BP (!) 130/96 (BP Location: Right Arm)   Pulse 90   Temp (!) 97.5 F (36.4 C) (Oral)   Resp 18   Ht 6' (1.829 m)   Wt 106.6 kg   SpO2 97%   BMI 31.87 kg/m   Physical Exam Vitals and nursing note reviewed.  Constitutional:      General: He is not in acute distress.    Appearance: Normal appearance. He is well-developed. He is not ill-appearing or diaphoretic.     Comments: Elderly male well-appearing and in no distress  HENT:     Head: Normocephalic and atraumatic.     Mouth/Throat:     Mouth: Mucous membranes are moist.     Pharynx: Oropharynx is clear.  Eyes:     General:        Right eye: No discharge.        Left eye: No discharge.     Extraocular Movements: Extraocular movements intact.     Pupils: Pupils are equal, round, and reactive to light.  Cardiovascular:     Rate and Rhythm: Tachycardia present. Rhythm irregular.     Heart sounds: Normal heart sounds.     Comments: Tachycardia with irregularly irregular rhythm Pulmonary:     Effort: Pulmonary effort is normal. No respiratory distress.     Breath sounds: Normal breath sounds. No wheezing or rales.     Comments: Respirations equal and unlabored, patient able to speak in full sentences, lungs clear to auscultation bilaterally Abdominal:     General: Bowel sounds are normal. There is no distension.     Palpations: Abdomen is soft. There is no mass.     Tenderness: There is no abdominal tenderness. There is no guarding.     Comments: Abdomen soft, nondistended, nontender to  palpation in all quadrants without guarding or peritoneal signs  Musculoskeletal:        General: No deformity.     Cervical back: Neck supple.     Right lower leg: Edema present.     Left lower leg: Edema present.  Skin:    General: Skin is warm and dry.     Capillary Refill: Capillary refill takes less than 2 seconds.  Neurological:     Mental Status: He is alert.     Coordination: Coordination normal.     Comments: Speech is clear, able to follow commands CN III-XII intact Normal strength in upper and lower extremities bilaterally including dorsiflexion and plantar flexion, strong and equal grip strength Sensation normal to light and sharp touch Moves extremities without ataxia, coordination intact  Psychiatric:        Mood and Affect: Mood normal.        Behavior: Behavior normal.     ED Results / Procedures / Treatments   Labs (all labs ordered are listed, but only abnormal results are displayed) Labs Reviewed  BASIC METABOLIC PANEL - Abnormal; Notable for the following components:      Result Value   Calcium 8.3 (*)    GFR, Estimated 59 (*)    All other components within normal limits  CBC - Abnormal; Notable for the following components:   RBC 3.62 (*)    Hemoglobin 11.2 (*)    HCT 35.4 (*)    All other components within normal limits  URINALYSIS, ROUTINE W REFLEX MICROSCOPIC - Abnormal; Notable for the following components:   Color, Urine STRAW (*)    All other components within normal limits  BRAIN NATRIURETIC PEPTIDE - Abnormal; Notable for the following components:   B Natriuretic Peptide 266.2 (*)    All other components within normal limits  CBG MONITORING, ED - Abnormal; Notable for the following components:   Glucose-Capillary 61 (*)    All other components within normal limits  RESPIRATORY PANEL BY RT PCR (FLU A&B, COVID)  MAGNESIUM  D-DIMER, QUANTITATIVE (NOT AT [facility])  CBG MONITORING, ED  TROPONIN I (HIGH SENSITIVITY)  TROPONIN I (HIGH SENSITIVITY)      EKG EKG Interpretation  Date/Time:  Sunday February 23 2020 16:21:13 EDT Ventricular Rate:  120 PR Interval:    QRS Duration: 102 QT Interval:  362 QTC Calculation: 512 R Axis:   -34 Text Interpretation: Atrial fibrillation Left axis deviation Borderline low voltage, extremity leads Prolonged QT interval No significant change since last tracing Confirmed by Wandra Arthurs [phone]) on 02/24/2020 9:48:50 AM   Radiology DG Chest Portable 1 View  Result Date: 02/22/2020 CLINICAL DATA:  Syncope EXAM: PORTABLE CHEST 1 VIEW COMPARISON:  February 05, 2020 FINDINGS: Lungs are clear. Heart is borderline enlarged with pulmonary vascularity normal. No adenopathy. No bone lesions. IMPRESSION: Borderline cardiac enlargement.  Lungs clear. Electronically Signed   By: Lowella Grip III M.D.   On: 02/22/2020 13:51    Procedures .Critical Care Performed by: Jacqlyn Larsen, PA-C Authorized by: Jacqlyn Larsen, PA-C   Critical care provider statement:    Critical care time (minutes):  45   Critical care was necessary to treat or prevent imminent or life-threatening deterioration of the following conditions:  Cardiac failure   Critical care was time spent personally by me on the following activities:  Discussions with consultants, evaluation of patient's response to treatment, examination of patient, ordering and performing treatments and interventions, ordering and review of laboratory studies,  ordering and review of radiographic studies, pulse oximetry, re-evaluation of patient's condition, obtaining history from patient or surrogate and review of old charts   (including critical care time)  Medications Ordered in ED Medications  sodium chloride flush (NS) 0.9 % injection 3 mL (has no administration in time range)  diltiazem (CARDIZEM) 1 mg/mL load via infusion 10 mg (has no administration in time range)    And  diltiazem (CARDIZEM) 125 mg in dextrose 5% 125 mL (1 mg/mL) infusion (has no  administration in time range)    ED Course  I have reviewed the triage vital signs and the nursing notes.  Pertinent labs & imaging results that were available during my care of the patient were reviewed by me and considered in my medical decision making (see chart for details).    MDM Rules/Calculators/A&P                          79 year old male with significant cardiac history of previous cardiac arrest, MI and A. fib, who presents after he had a syncopal episode in his car, fortunately before he started driving, patient had no prodrome and prior to this episode was in his usual state of health with no associated chest pain, shortness of breath or palpitations.  Patient does not know how long he was out but suspect it was minutes.  He was found to be in A. fib with heart rates in the 120s on EMS arrival, patient does not seem to be particularly aware or symptomatic of his A. fib.  He was actually scheduled for a cardioversion next week.  Has been taking his medications regularly.  Given patient's history and fairly concerning story for syncope without prodrome I suspect he will likely require admission.  Upon arrival to the ED patient is in A. fib with rates ranging 111-120.  He does have some slightly worsened lower extremity edema as well.  Will check basic labs, troponin, BNP, chest x-ray.  Will monitor patient's heart rate closely if it elevates further he will likely need medical management for rate control.  EKG consistent with A. fib with RVR, rates in the 110s Chest x-ray with slight cardiomegaly but no evidence of edema or other active cardiopulmonary disease.  Labs overall reassuring, no significant electrolyte derangements, no leukocytosis, stable hemoglobin, troponin is not elevated.  BNP of 266.  Given concerning syncope will discuss with cardiology.  3:37 PM notified by nursing staff that patient's heart rate has significantly increased into the 140s, repeat EKG shows A. fib  with RVR, at this time feel patient will require medication for control as his heart rate is consistently above 120s, will start patient on Cardizem bolus and infusion.  Awaiting callback from cardiology.  Case discussed with Dr. Debara Pickett with cardiology who agrees with plan for Cardizem, and he will admit patient to cardiology service, unclear cause for patient's syncope, but given history of previous cardiac arrest and known history of A. fib will need further evaluation.  Final Clinical Impression(s) / ED Diagnoses Final diagnoses:  Syncope, unspecified syncope type  Atrial fibrillation with rapid ventricular response [facility])    Rx / DC Orders ED Discharge Orders    None       Jacqlyn Larsen, [place] 02/24/20 1759    Merryl Hacker, MD 02/25/20 [phone]

## 2020-02-22 NOTE — H&P (Addendum)
Cardiology Admission History and Physical:   Patient ID: Darryl Diaz MRN: [id number]; DOB: [birth year]   Admission date: 02/22/2020  Primary Care Provider: [name], MD [facility] Cardiologist: Sherren Mocha, MD  [facility] Electrophysiologist:  Virl Axe, MD   Chief Complaint:  syncope  Patient Profile:   Darryl Diaz is a 79 y.o. male with a PMH of paroxysmal atrial fibrillation, bradycardic arrest 04/2019 2/2 metoprolol/diltiazem/amio in combo with grapefruit juice, HTN, HLD, GERD, GIB 2/2 mallory weiss tear, and OSA not on CPAP, who is being admitted by cardiology for syncope and atrial fibrillation with RVR.   History of Present Illness:   [name] was recently admitted to the hospital from 02/05/20-02/07/20 for atrial fibrillation with RVR. He underwent successful DCCV that admission given compliance with his eliquis. He was discharged on toprol XL for rate control. His hospital course was complicated by electrolyte imbalance and pressure wounds (managed by Wound Care RN). He was ultimately discharged to SNF given weakness. He was seen for post-hospital follow-up by Robbie Lis, PA-C 02/18/20 and unfortunately was back in atrial fibrillation with HR in the 100s-110s with ongoing complaints of fatigue. Case discussed with Dr. Caryl Comes who recommended he start flecainide 100mg  BID and plan for DCCV 02/24/20. Prior to this he had an echocardiogram 02/06/20 which showed EF 60-65%, indeterminate LV diastolic function, no RWMA, mild hyperkinesis of the RV apex relative to the RV free wall (c/f possible PE), low-normal RV systolic function, mild LAE, mild MR, and mild dilation of the ascending aorta.  The patient presented to the ED today following a syncopal episode. He was in the car about to leave his house to complete his pre-procedure COVID testing when he experienced loss of consciousness. EMS was activated and he was found to be in atrial fibrillation with RVR  with rates up to 120s. On arrival to the ED HR elevated to 110s-130, otherwise VSS. Labs notable for K 3.5, Mg 1.9, Cr 1.1, Hgb 11.2, PLT 295, BNP 266 (down from 02/06/20), HsTrop 5. 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  At the time of this evaluation he feels back to his usual self. He reports that he has not yet started flecainide that was prescribed at his recent outpatient visit 02/18/20. He was in his car about to leave for his COVID-19 testing and next thing he remembers was waking up. He is unsure how long he lost consciousness for but feels like it could have been seconds-minutes. He had no DOE when walking to his car and denies any prodromal symptoms of chest pain, palpitations, dizziness, or lightheadedness. He has chronic LE edema which he thinks is mildly worse today as his shoes were difficult to put on. He reports compliance with his other medications including eliquis and lasix.     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   Anemia    Bradycardia    low 20's   Cardiac arrest ([place]) 04/20/2019   12 min CPR with epi   Dysrhythmia    Fall at home 03/28/2016   Fatty liver    GERD (gastroesophageal reflux disease)    H/O seasonal allergies    History of GI bleed    Hx of adenomatous colonic polyps 08/10/2017   Hyperlipidemia    Hypertension    Insomnia    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   MI (myocardial  infarction) ([place]) 04/2019   had CPR by EMS   OA (osteoarthritis)    hands   OSA (obstructive sleep apnea)    No longer uses CPAP   Persistent atrial fibrillation with rapid ventricular response ([place]) 02/05/2020    Past Surgical History:  Procedure Laterality Date   ANKLE SURGERY Left 2014   had rods put in    BIOPSY  02/28/2019   Procedure: BIOPSY;  Surgeon: Milus Banister, MD;  Location: [place];  Service: Endoscopy;;   CARDIOVERSION  02/06/2020    CARDIOVERSION N/A 02/06/2020   Procedure: CARDIOVERSION;  Surgeon: Werner Lean, MD;  Location: [place] ENDOSCOPY;  Service: Cardiovascular;  Laterality: N/A;   COLONOSCOPY     [place]   ESOPHAGOGASTRODUODENOSCOPY (EGD) WITH PROPOFOL N/A 02/28/2019   Procedure: ESOPHAGOGASTRODUODENOSCOPY (EGD) WITH PROPOFOL;  Surgeon: Milus Banister, MD;  Location: [facility] ENDOSCOPY;  Service: Endoscopy;  Laterality: N/A;   HIP ARTHROPLASTY Left 04/01/2016   Procedure: ARTHROPLASTY BIPOLAR HIP (HEMIARTHROPLASTY);  Surgeon: Paralee Cancel, MD;  Location: [place];  Service: Orthopedics;  Laterality: Left;   HIP CLOSED REDUCTION Left 04/30/2016   Procedure: CLOSED REDUCTION HIP;  Surgeon: Wylene Simmer, MD;  Location: [place];  Service: Orthopedics;  Laterality: Left;   TONSILLECTOMY     TOTAL HIP ARTHROPLASTY Right 10/15/2019   Procedure: TOTAL HIP ARTHROPLASTY ANTERIOR APPROACH;  Surgeon: Paralee Cancel, MD;  Location: [facility];  Service: Orthopedics;  Laterality: Right;  70 mins     Medications Prior to Admission: Prior to Admission medications   Medication Sig Start Date End Date Taking? Authorizing Provider  apixaban (ELIQUIS) 5 MG TABS tablet Take 1 tablet (5 mg total) by mouth 2 (two) times daily. 02/12/20   Medina-Vargas, Monina C, NP  atorvastatin (LIPITOR) 20 MG tablet Take 1 tablet (20 mg total) by mouth daily. 02/12/20   Medina-Vargas, Monina C, NP  escitalopram (LEXAPRO) 10 MG tablet Take 1 tablet (10 mg total) by mouth daily. 02/12/20   Medina-Vargas, Monina C, NP  ferrous sulfate 325 (65 FE) MG tablet Take 1 tablet (325 mg total) by mouth 2 (two) times daily with a meal. 02/12/20 03/13/20  Medina-Vargas, Monina C, NP  flecainide (TAMBOCOR) 100 MG tablet Take 1 tablet (100 mg total) by mouth 2 (two) times daily. 02/18/20   Bhagat, Crista Luria, PA  folic acid (FOLVITE) 1 MG tablet Take 1 tablet (1 mg total) by mouth daily. 02/12/20   Medina-Vargas, Monina C, NP  furosemide (LASIX) 40 MG tablet Take 1 tablet (40  mg total) by mouth daily as needed for fluid (weight gain). 02/12/20   Medina-Vargas, Monina C, NP  loratadine (CLARITIN) 10 MG tablet Take 1 tablet (10 mg total) by mouth daily. 02/12/20   Medina-Vargas, Monina C, NP  magnesium oxide (MAG-OX) 400 (241.3 Mg) MG tablet Take 1 tablet (400 mg total) by mouth daily. 02/20/20   Bhagat, Crista Luria, PA  metoprolol succinate (TOPROL-XL) 50 MG 24 hr tablet Take 1 tablet (50 mg total) by mouth daily. Take with or immediately following a meal. 02/18/20 05/18/20  Bhagat, Crista Luria, PA  Multiple Vitamin (MULTIVITAMIN WITH MINERALS) TABS tablet Take 1 tablet by mouth daily.    [provider]  pantoprazole (PROTONIX) 40 MG tablet Take 1 tablet (40 mg total) by mouth daily. 02/12/20   Medina-Vargas, Monina C, NP  potassium chloride SA (KLOR-CON) 20 MEQ tablet Take 2 tablets (40 mEq total) by mouth daily. 02/12/20   Medina-Vargas, Monina C, NP  Thiamine HCl (VITAMIN B-1) 250 MG tablet  Take 1 tablet (250 mg total) by mouth daily. 02/12/20   Medina-Vargas, Monina C, NP  traZODone (DESYREL) 50 MG tablet Take 1 tablet (50 mg total) by mouth at bedtime. 02/12/20   Medina-Vargas, Monina C, NP     Allergies:    Allergies  Allergen Reactions   Penicillins Other (See Comments)    Tolerated Cephalosporin Date: 10/15/19. Allergy is from childhood Did it involve swelling of the face/tongue/throat, SOB, or low BP? Unknown Did it involve sudden or severe rash/hives, skin peeling, or any reaction on the inside of your mouth or nose? Unknown Did you need to seek medical attention at a hospital or doctor's office? Unknown When did it last happen?    childhood reaction   If all above answers are "NO", may proceed with cephalosporin use.    Social History:   Social History   Socioeconomic History   Marital status: Widowed    Spouse name: Not on file   Number of children: 2   Years of education: college   Highest education level: Not on file  Occupational History     Occupation: retired  Tobacco Use   Smoking status: Former Smoker    Types: Cigarettes   Smokeless tobacco: Never Used   Tobacco comment:    quit 20 years  smoked on and off for 20 years  Vaping Use   Vaping Use: Never used  Substance and Sexual Activity   Alcohol use: No    Comment: 03/2016   i HAD ONE DRINK & THAT WAS THE FIRST DRINK i HAD IN A LONG TIME    Drug use: No   Sexual activity: Not Currently  Other Topics Concern   Not on file  Social History Narrative   The patient is divorced w/ 1 son 1 daughter   He is a retired Regulatory affairs officer, he lived in [place] but [employer] including windows on the world in the [place] room in [place]   He moved to [place] to be near his sister who lives in [place] alcoholic member of AA   2 caffeinated beverages daily prior smoker no drugs   Social Determinants of Radio broadcast assistant Strain:    Difficulty of Paying Living Expenses: Not on file  Food Insecurity:    Worried About Charity fundraiser in the Last Year: Not on file   [place] in the Last Year: Not on file  Transportation Needs:    Film/video editor (Medical): Not on file   Lack of Transportation (Non-Medical): Not on file  Physical Activity:    Days of Exercise per Week: Not on file   Minutes of Exercise per Session: Not on file  Stress:    Feeling of Stress : Not on file  Social Connections:    Frequency of Communication with Friends and Family: Not on file   Frequency of Social Gatherings with Friends and Family: Not on file   Attends Religious Services: Not on file   Active Member of Clubs or Organizations: Not on file   Attends Archivist Meetings: Not on file   Marital Status: Not on file  Intimate Partner Violence:    Fear of Current or Ex-Partner: Not on file   Emotionally Abused: Not on file   Physically Abused: Not on file   Sexually Abused: Not on file    Family History:   The  patient's family history includes Arthritis in his mother;  Heart disease (age of onset: 64) in his mother; Heart failure in his sister; Heart failure (age of onset: 69) in his father; Hypertension in his mother; Stroke in his maternal aunt. There is no history of Colon cancer, Esophageal cancer, Stomach cancer, or Rectal cancer.    ROS:  Please see the history of present illness.  All other ROS reviewed and negative.     Physical Exam/Data:   Vitals:   02/22/20 1415 02/22/20 1430 02/22/20 1445 02/22/20 1500  BP: 130/81 130/84 131/79 131/86  Pulse: (!) 114 (!) 115 (!) 115 (!) 116  Resp: 16 (!) 23 14 17   Temp:      TempSrc:      SpO2: 97% 99% 100% 99%  Weight:      Height:       No intake or output data in the 24 hours ending 02/22/20 1724 Last 3 Weights 02/22/2020 02/18/2020 02/12/2020  Weight (lbs) 235 lb 237 lb 12.8 oz 224 lb 14.4 oz  Weight (kg) 106.595 kg 107.865 kg 102.014 kg     Body mass index is 31.87 kg/m.  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 Left ventricular ejection fraction, by estimation, is 60 to 65%. The  left ventricle has normal function. The left ventricle has no regional  wall motion abnormalities. Left ventricular diastolic parameters are  indeterminate.   2. There is mild hyperkinesis of the RV apex relative to the RV free  wall. Recommend clinical correlation  if concern for underlying pulmonary  embolism.      . Right ventricular systolic function is low normal. The right  ventricular size is mildly enlarged. There is mildly elevated pulmonary  artery systolic pressure.   3. Left atrial size was mildly dilated.   4. The mitral valve is normal in structure. Suspect at least mild,  eccentric mitral regurgitation secondary to tethering of the posterior  mitral leaflet. mitral valve regurgitation.   5. The aortic valve is tricuspid. There is mild calcification of the  aortic valve. There is moderate thickening of the aortic valve. Aortic  valve regurgitation is not visualized. Mild to moderate aortic valve  sclerosis/calcification is present, without  any evidence of aortic stenosis.   6. Aortic dilatation noted. There is mild dilatation of the ascending  aorta, measuring 40 mm. There is mild dilatation of the aortic root,  measuring 39 mm.   7. The inferior vena cava is dilated in size with <50% respiratory  variability, suggesting right atrial pressure of 15 mmHg.   Laboratory Data:  High Sensitivity Troponin:   Recent Labs  Lab 02/06/20 0916 02/22/20 1433 02/22/20 1532  TROPONINIHS 20* 5 5      Chemistry Recent Labs  Lab 02/18/20 1625 02/22/20 1141  NA 137 136  K 4.7 3.5  CL 100 98  CO2 22 24  GLUCOSE 82 80  BUN 13 10  CREATININE 1.06 1.17  CALCIUM  8.8 8.3*  GFRNONAA 67 59*  GFRAA 77  --   ANIONGAP  --  14    No results for input(s): PROT, ALBUMIN, AST, ALT, ALKPHOS, BILITOT in the last 168 hours. Hematology Recent Labs  Lab 02/18/20 1625 02/22/20 1141  WBC 7.1 4.9  RBC 3.26* 3.62*  HGB 10.5* 11.2*  HCT 30.8* 35.4*  MCV 95 97.8  MCH 32.2 30.9  MCHC 34.1 31.6  RDW 14.3 15.1  PLT 316 295   BNP Recent Labs  Lab 02/22/20 1433  BNP 266.2*    DDimer No results for input(s): DDIMER in the last 168 hours.   Radiology/Studies:  DG Chest Portable 1 View  Result Date: 02/22/2020 CLINICAL DATA:  Syncope EXAM:  PORTABLE CHEST 1 VIEW COMPARISON:  February 05, 2020 FINDINGS: Lungs are clear. Heart is borderline enlarged with pulmonary vascularity normal. No adenopathy. No bone lesions. IMPRESSION: Borderline cardiac enlargement.  Lungs clear. Electronically Signed   By: Lowella Grip III M.D.   On: 02/22/2020 13:51     Assessment and Plan:   1. Syncope: patient presented with sudden loss of consciousness while sitting in his car. No prodromal symptoms. EKG on arrival with afib with RVR but non-ischemic. Recent echo 01/2020 showed EF 60-65%, indeterminate LV diastolic function, and RV findings suspicious for possible PE. No chest pain, SOB, or DOE complaints.  He did not start flecainide as recently recommended. Symptoms c/f cardiogenic syncope. No prior ischemic evaluation.  - Will check a limited echo to re-evaluate LV/RV function - Will check a NST to r/o ischemia - Will check a Ddimer to r/o PE given recent echo with RV findings suspicious for PE - Continue to monitor on telemetry  2. Persistent atrial fibrillation: s/p recent successful DCCV 02/06/20 with reoccurrence shortly after discharge. Was recommended to start flecainide and planned for DCCV on Monday, however he did not pick up his flecainide prescription. He has had trouble with bradycardia in the past resulting in a bradycardic arrest. HR has been in the 110s-130s in the ED - Will continued diltiazem gtt rate control - Continue eliquis for stroke ppx - Will hold flecainide for now pending ischemic eval - Will reassess need for DCCV on Monday pending above work-up  3. Acute on chronic diastolic CHF: LE edema is a bit worse. BNP 266. CXR without acute findings. Lungs are clear but he does have 2+ LE edema.  - Will give IV lasix 40mg  BID - Monitor strict I&Os and daily weights - Monitor electrolytes and replete to maintain K >4, Mg >2 - both repleted  4. HTN: BP stable - Continue diltiazem gtt for now  5. HLD: LDL 29 04/2019 -  Continue atorvastatin  6. Groin rash: appears to have a mild yeast infection - Will use miconazole powder           New York Heart Association (NYHA) Functional Class NYHA Class II   CHA2DS2-VASc Score = 4  This indicates a 4.8% annual risk of stroke. The patient's score is based upon: CHF History: 1 HTN History: 1 Diabetes History: 0 Stroke History: 0 Vascular Disease History: 0 Age Score: 2 Gender Score: 0     Severity of Illness: The appropriate patient status for this patient is OBSERVATION. Observation status is judged to be reasonable and necessary in order to provide the required intensity of service to ensure the patient's safety. The patient's presenting symptoms, physical exam findings, and initial radiographic and laboratory data in the context of their medical condition  is felt to place them at decreased risk for further clinical deterioration. Furthermore, it is anticipated that the patient will be medically stable for discharge from the hospital within 2 midnights of admission. The following factors support the patient status of observation.   " The patient's presenting symptoms include syncope. " The physical exam findings include 2+ LE edema. " The initial radiographic and laboratory data are EKG showed atrial fibrillation with RVR.     For questions or updates, please contact [place] Please consult [email or web address] for contact info under     Signed, Abigail Butts, PA-C  02/22/2020 5:24 PM    Patient seen and examined with Roby Lofts and agree with the findings as detailed above.  In brief, Mr. Knierim is a 25 male with a PMH of paroxysmal atrial fibrillation, bradycardic arrest 04/2019 2/2 metoprolol/diltiazem/amio in combo with grapefruit juice, HTN, HLD, GERD, GIB 2/2 mallory weiss tear, and OSA not on CPAP, who presented with syncope and Afib with RVR now admitted to cardiology service.  Etiology of syncope unclear, however, concerning  for cardiac etiology with sudden loss of consciousness without prodrome. Possible VT vs symptomatic conversion pause (although back in Afib now) vs less likely unstable Afib with RVR vs ?PE. No ischemic work-up in our system yet, but trop negative on admission and no ischemic changes on ECG. Patient had not started flecainide yet and was just taking home metoprolol. Reports compliance with home medications. No recent illnesses, nausea, vomiting or diarrhea. Has been otherwise feeling okay until the event. Back to baseline mental status following the episode. Denied any recent chest pain/pressure, shortness of breath, DOE, orthopnea, or PND.   Exam: General: Alert, awake, cooperative Resp: CTAB, no crackles CV: Tachycardic, irregular, no murmurs Abd: Soft, ND, NTTP Extremities: Warm, 2+ pitting edema to the knees Skin: Rash on bilateral thighs Neuro: Grossly intact  Plan: -Continue IV dilt for rate control; EF normal on last TTE -Continue home eliquis for [facility] -Will check nuclear stress to assess for ischemic disease  -Limited TTE to reassess LV/RV function -Diuresis with lasix 40mg  IV BID -Replete lytes with K>4, Mg>2 -Check d-dimer given RV free wall hypokinesis on last TTE; if elevated plan for CTA -Monitor on telemetry -IF above work-up unrevealing, may need long term cardiac monitoring   Gwyndolyn Kaufman, MD

## 2020-02-22 NOTE — ED Triage Notes (Signed)
Pt to triage via [facility] from home.  Scheduled for a procedure on Monday and was going to have a pre-procedural COVID test today.  Pt had syncopal episode in his car prior to leaving his house.  He woke up in his car.  Per EMS, afib in the 120s on their arrival.  20 g L FA.  NS 500cc bolus.  Denies complaint.

## 2020-02-23 ENCOUNTER — Inpatient Hospital Stay (HOSPITAL_COMMUNITY): Payer: Medicare Other

## 2020-02-23 ENCOUNTER — Other Ambulatory Visit (HOSPITAL_COMMUNITY): Payer: Medicare Other

## 2020-02-23 ENCOUNTER — Other Ambulatory Visit: Payer: Self-pay

## 2020-02-23 DIAGNOSIS — I5033 Acute on chronic diastolic (congestive) heart failure: Secondary | ICD-10-CM | POA: Diagnosis not present

## 2020-02-23 DIAGNOSIS — R55 Syncope and collapse: Secondary | ICD-10-CM

## 2020-02-23 DIAGNOSIS — I4819 Other persistent atrial fibrillation: Secondary | ICD-10-CM | POA: Diagnosis not present

## 2020-02-23 LAB — BASIC METABOLIC PANEL
Anion gap: 14 (ref 5–15)
BUN: 12 mg/dL (ref 8–23)
CO2: 24 mmol/L (ref 22–32)
Calcium: 8.8 mg/dL — ABNORMAL LOW (ref 8.9–10.3)
Chloride: 100 mmol/L (ref 98–111)
Creatinine, Ser: 1.31 mg/dL — ABNORMAL HIGH (ref 0.61–1.24)
GFR, Estimated: 52 mL/min — ABNORMAL LOW (ref >60)
Glucose, Bld: 136 mg/dL — ABNORMAL HIGH (ref 70–99)
Potassium: 3.8 mmol/L (ref 3.5–5.1)
Sodium: 138 mmol/L (ref 135–145)

## 2020-02-23 LAB — CBC
HCT: 35.6 % — ABNORMAL LOW (ref 39.0–52.0)
Hemoglobin: 11.8 g/dL — ABNORMAL LOW (ref 13.0–17.0)
MCH: 31.9 pg (ref 26.0–34.0)
MCHC: 33.1 g/dL (ref 30.0–36.0)
MCV: 96.2 fL (ref 80.0–100.0)
Platelets: 291 10*3/uL (ref 150–400)
RBC: 3.7 MIL/uL — ABNORMAL LOW (ref 4.22–5.81)
RDW: 15.1 % (ref 11.5–15.5)
WBC: 7.9 10*3/uL (ref 4.0–10.5)
nRBC: 0 % (ref 0.0–0.2)

## 2020-02-23 LAB — NM MYOCAR MULTI W/SPECT W/WALL MOTION / EF
Estimated workload: 1 METS
Exercise duration (min): 0 min
Exercise duration (sec): 0 s
LV dias vol: 67 mL (ref 62–150)
LV sys vol: 1 mL
MPHR: 142 {beats}/min
Peak HR: 139 {beats}/min
Percent HR: 97 %
Rest HR: 117 {beats}/min

## 2020-02-23 LAB — MAGNESIUM: Magnesium: 1.8 mg/dL (ref 1.7–2.4)

## 2020-02-23 MED ORDER — POTASSIUM CHLORIDE CRYS ER 20 MEQ PO TBCR
30.0000 meq | EXTENDED_RELEASE_TABLET | Freq: Once | ORAL | Status: AC
  Administered 2020-02-23: 30 meq via ORAL
  Filled 2020-02-23 (×2): qty 1

## 2020-02-23 MED ORDER — FUROSEMIDE 20 MG PO TABS: 40.0000 mg | ORAL_TABLET | Freq: Every day | ORAL | Status: DC

## 2020-02-23 MED ORDER — REGADENOSON 0.4 MG/5ML IV SOLN
INTRAVENOUS | Status: AC
  Administered 2020-02-23: 0.4 mg via INTRAVENOUS
  Filled 2020-02-23: qty 5

## 2020-02-23 MED ORDER — REGADENOSON 0.4 MG/5ML IV SOLN
0.4000 mg | Freq: Once | INTRAVENOUS | Status: AC
  Filled 2020-02-23: qty 5

## 2020-02-23 MED ORDER — MAGNESIUM SULFATE 2 GM/50ML IV SOLN
2.0000 g | Freq: Once | INTRAVENOUS | Status: AC
  Administered 2020-02-23: 2 g via INTRAVENOUS
  Filled 2020-02-23: qty 50

## 2020-02-23 MED ORDER — TECHNETIUM TC 99M TETROFOSMIN IV KIT
30.8000 | PACK | Freq: Once | INTRAVENOUS | Status: AC | PRN
  Administered 2020-02-23: 30.8 via INTRAVENOUS

## 2020-02-23 MED ORDER — PERFLUTREN LIPID MICROSPHERE
1.0000 mL | INTRAVENOUS | Status: AC | PRN
  Administered 2020-02-23: 3 mL via INTRAVENOUS
  Filled 2020-02-23: qty 10

## 2020-02-23 MED ORDER — TECHNETIUM TC 99M TETROFOSMIN IV KIT
10.3000 | PACK | Freq: Once | INTRAVENOUS | Status: AC | PRN
  Administered 2020-02-23: 10.3 via INTRAVENOUS

## 2020-02-23 NOTE — Progress Notes (Addendum)
Progress Note  Patient Name: Darryl Diaz Date of Encounter: 02/23/2020  Primary Cardiologist: Sherren Mocha, MD   Subjective   Patient reports feeling fine this morning. Thinks his LE edema is better. Notes some racing heart beat sensation but generally unaware of his atrial fibrillation. No complaints of chest pain or SOB.   Inpatient Medications    Scheduled Meds: . apixaban  5 mg Oral BID  . atorvastatin  20 mg Oral Daily  . escitalopram  10 mg Oral Daily  . ferrous sulfate  325 mg Oral BID WC  . folic acid  1 mg Oral Daily  . furosemide  40 mg Intravenous Q12H  . loratadine  10 mg Oral Daily  . miconazole nitrate   Topical BID  . pantoprazole  40 mg Oral QAC breakfast  . regadenoson      . regadenoson  0.4 mg Intravenous Once  . sodium chloride flush  3 mL Intravenous Once  . vitamin B-1  250 mg Oral Daily   Continuous Infusions: . diltiazem (CARDIZEM) infusion 15 mg/hr (02/23/20 0917)   PRN Meds: acetaminophen, nitroGLYCERIN, ondansetron (ZOFRAN) IV   Vital Signs    Vitals:   02/23/20 0930 02/23/20 1100 02/23/20 1130 02/23/20 1244  BP: 125/84 107/72 131/77 (!) 142/69  Pulse: (!) 111 95 (!) 104   Resp: (!) 22 20 (!) 22   Temp:      TempSrc:      SpO2: 98% 96% 95%   Weight:      Height:        Intake/Output Summary (Last 24 hours) at 02/23/2020 1300 Last data filed at 02/23/2020 1010 Gross per 24 hour  Intake 50 ml  Output 1375 ml  Net -1325 ml   Filed Weights   02/22/20 1144 02/23/20 0922  Weight: 106.6 kg 106.6 kg    Telemetry    Unable to review but HR in the 120s-140s on EKG monitor at the time of his stress test.  - Personally Reviewed  ECG    No new tracings - Personally Reviewed  Physical Exam   GEN: No acute distress.   Neck: No JVD, no carotid bruits Cardiac: RRR, no murmurs, rubs, or gallops.  Respiratory: Clear to auscultation bilaterally, no wheezes/ rales/ rhonchi GI: NABS, Soft, nontender, non-distended  MS: No  edema; No deformity. Neuro:  Nonfocal, moving all extremities spontaneously Psych: Normal affect   Labs    Chemistry Recent Labs  Lab 02/18/20 1625 02/22/20 1141 02/23/20 0349  NA 137 136 138  K 4.7 3.5 3.8  CL 100 98 100  CO2 22 24 24   GLUCOSE 82 80 136*  BUN 13 10 12   CREATININE 1.06 1.17 1.31*  CALCIUM 8.8 8.3* 8.8*  GFRNONAA 67 59* 52*  GFRAA 77  --   --   ANIONGAP  --  14 14     Hematology Recent Labs  Lab 02/18/20 1625 02/22/20 1141 02/23/20 0349  WBC 7.1 4.9 7.9  RBC 3.26* 3.62* 3.70*  HGB 10.5* 11.2* 11.8*  HCT 30.8* 35.4* 35.6*  MCV 95 97.8 96.2  MCH 32.2 30.9 31.9  MCHC 34.1 31.6 33.1  RDW 14.3 15.1 15.1  PLT 316 295 291    Cardiac EnzymesNo results for input(s): TROPONINI in the last 168 hours. No results for input(s): TROPIPOC in the last 168 hours.   BNP Recent Labs  Lab 02/22/20 1433  BNP 266.2*     DDimer  Recent Labs  Lab 02/22/20 2051  DDIMER 0.38  Radiology    DG Chest Portable 1 View  Result Date: 02/22/2020 CLINICAL DATA:  Syncope EXAM: PORTABLE CHEST 1 VIEW COMPARISON:  February 05, 2020 FINDINGS: Lungs are clear. Heart is borderline enlarged with pulmonary vascularity normal. No adenopathy. No bone lesions. IMPRESSION: Borderline cardiac enlargement.  Lungs clear. Electronically Signed   By: Lowella Grip III M.D.   On: 02/22/2020 13:51    Cardiac Studies   Echocardiogram 02/06/20: 1. Left ventricular ejection fraction, by estimation, is 60 to 65%. The  left ventricle has normal function. The left ventricle has no regional  wall motion abnormalities. Left ventricular diastolic parameters are  indeterminate.  2. There is mild hyperkinesis of the RV apex relative to the RV free  wall. Recommend clinical correlation if concern for underlying pulmonary  embolism.   . Right ventricular systolic function is low normal. The right  ventricular size is mildly enlarged. There is mildly elevated pulmonary  artery  systolic pressure.  3. Left atrial size was mildly dilated.  4. The mitral valve is normal in structure. Suspect at least mild,  eccentric mitral regurgitation secondary to tethering of the posterior  mitral leaflet. mitral valve regurgitation.  5. The aortic valve is tricuspid. There is mild calcification of the  aortic valve. There is moderate thickening of the aortic valve. Aortic  valve regurgitation is not visualized. Mild to moderate aortic valve  sclerosis/calcification is present, without  any evidence of aortic stenosis.  6. Aortic dilatation noted. There is mild dilatation of the ascending  aorta, measuring 40 mm. There is mild dilatation of the aortic root,  measuring 39 mm.  7. The inferior vena cava is dilated in size with <50% respiratory  variability, suggesting right atrial pressure of 15 mmHg.   Patient Profile     79 y.o. 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  Assessment & Plan    1. Syncope: patient presented with sudden loss of consciousness while sitting in his car. No prodromal symptoms. EKG on arrival with afib with RVR but non-ischemic. Recent echo 01/2020 showed EF 60-65%, indeterminate LV diastolic function, and RV findings suspicious for possible PE. DDimer negative this admission. No chest pain, SOB, or DOE complaints.  He did not start flecainide as recently recommended. Symptoms c/f cardiogenic syncope. No prior ischemic evaluation.  - Await limited echo to re-evaluate LV/RV function - Will follow-up NST results to r/o ischemia - Continue to monitor on telemetry  2. Persistent atrial fibrillation: s/p recent successful DCCV 02/06/20 with reoccurrence shortly after discharge. Was recommended to start flecainide and planned  for DCCV on Monday, however he did not pick up his flecainide prescription. He has had trouble with bradycardia in the past resulting in a bradycardic arrest. He was started on a diltiazem gtt - uptitrated to 15/hr. HR in the 90s-120s this morning.  - Will continued diltiazem gtt rate control - May need to cautiously add BBlocker for improved rate control  - Continue eliquis for stroke ppx - Will hold flecainide for now pending ischemic eval - Scheduled for outpatient DCCV 02/24/20 - will keep NPO after MN and send a message to Trish to transition case to inpatient   3. Acute on chronic diastolic CHF: LE edema is a bit worse. BNP 266. CXR without acute findings. Lungs are clear  but he did have 2+ LE edema on admission - improved on exam today. Cr bumped to 1.31 - Will transition to po lasix 40mg  daily given bump in Cr.  - Monitor strict I&Os and daily weights - Monitor electrolytes and replete to maintain K >4, Mg >2 - both repleted again today  4. HTN: BP stable - Continue diltiazem gtt for now  5. HLD: LDL 29 04/2019 - Continue atorvastatin  6. Groin rash: appears to have a mild yeast infection - Continue miconazole powder   7. Gait instability: RN notes unsteadiness when getting out of bed.  - Will place PT/OT orders.   For questions or updates, please contact [place] Please consult [email or web address] for contact info under Cardiology/STEMI.      Signed, Abigail Butts, PA-C  02/23/2020, 1:00 PM   [phone]  Patient seen and examined and agree with Roby Lofts, PA as detailed above.  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  Doing better today. Received myoview this morning. Awaiting TTE. Had several  episodes Afib with RVR during stress but improved back to low 100s on my evaluation.  Exam: General: Comfortable Resp: CTAB CV: Tachycardic, irregular, no murmurs Abd: Soft, ND, NTTP Extremities: Warm, 1+ pitting edema Neuro: Non-focal  Plan: -Follow-up TTE -Follow-up myoview -Continue dilt gtt -Can add metop if additional rate control needed -Pending results of TTE and myoview, can likely proceed with DCCV tomorrow -Will keep NPO tonight -Continue eliquis  Gwyndolyn Kaufman, MD

## 2020-02-23 NOTE — ED Notes (Signed)
Pt transported to nuclear med.  

## 2020-02-23 NOTE — ED Notes (Signed)
Pt was OOB to bedside commode. Pt unable to pass BM, but passed gas. Pt very shaky and unsteady on feet, but persistent to use commode. Pt HR still elevated at 130's at this time.

## 2020-02-23 NOTE — Progress Notes (Signed)
  Echocardiogram 2D Echocardiogram has been performed.  Darryl Diaz 02/23/2020, 5:43 PM

## 2020-02-23 NOTE — Progress Notes (Signed)
   Darryl Diaz presented for a nuclear stress test today.  No immediate complications.  Stress imaging is pending at this time.  Preliminary EKG findings may be listed in the chart, but the stress test result will not be finalized until perfusion imaging is complete.   Abigail Butts, PA-C 02/23/2020, 2:19 PM

## 2020-02-23 NOTE — H&P (View-Only) (Signed)
   Darryl Diaz presented for a nuclear stress test today.  No immediate complications.  Stress imaging is pending at this time.  Preliminary EKG findings may be listed in the chart, but the stress test result will not be finalized until perfusion imaging is complete.   Abigail Butts, PA-C 02/23/2020, 2:19 PM

## 2020-02-23 NOTE — ED Notes (Signed)
Pt A-fib w/RVR on monitor

## 2020-02-23 NOTE — Progress Notes (Signed)
  Echocardiogram 2D Echocardiogram has been attempted. Patient in [facility]. Will reattempt at later date.  Rook Maue G Cordarious Zeek 02/23/2020, 12:51 PM

## 2020-02-24 ENCOUNTER — Encounter (HOSPITAL_COMMUNITY)
Admission: EM | Disposition: A | Discharge status: Rehabilitation Facility | Payer: Self-pay | Source: Home / Self Care | Attending: Cardiology

## 2020-02-24 ENCOUNTER — Ambulatory Visit (HOSPITAL_COMMUNITY): Admission: RE | Admit: 2020-02-24 | Payer: Medicare Other | Source: Home / Self Care | Admitting: Internal Medicine

## 2020-02-24 ENCOUNTER — Inpatient Hospital Stay (HOSPITAL_COMMUNITY): Payer: Medicare Other | Admitting: Certified Registered"

## 2020-02-24 ENCOUNTER — Encounter (HOSPITAL_COMMUNITY): Payer: Self-pay | Admitting: Cardiology

## 2020-02-24 ENCOUNTER — Other Ambulatory Visit: Payer: Self-pay

## 2020-02-24 DIAGNOSIS — I4891 Unspecified atrial fibrillation: Secondary | ICD-10-CM

## 2020-02-24 DIAGNOSIS — I4819 Other persistent atrial fibrillation: Secondary | ICD-10-CM | POA: Diagnosis not present

## 2020-02-24 DIAGNOSIS — I5033 Acute on chronic diastolic (congestive) heart failure: Secondary | ICD-10-CM | POA: Diagnosis not present

## 2020-02-24 DIAGNOSIS — R55 Syncope and collapse: Secondary | ICD-10-CM | POA: Diagnosis not present

## 2020-02-24 HISTORY — PX: CARDIOVERSION: SHX1299

## 2020-02-24 LAB — BASIC METABOLIC PANEL
Anion gap: 13 (ref 5–15)
BUN: 10 mg/dL (ref 8–23)
CO2: 25 mmol/L (ref 22–32)
Calcium: 8.7 mg/dL — ABNORMAL LOW (ref 8.9–10.3)
Chloride: 100 mmol/L (ref 98–111)
Creatinine, Ser: 1.35 mg/dL — ABNORMAL HIGH (ref 0.61–1.24)
GFR, Estimated: 50 mL/min — ABNORMAL LOW (ref >60)
Glucose, Bld: 128 mg/dL — ABNORMAL HIGH (ref 70–99)
Potassium: 3 mmol/L — ABNORMAL LOW (ref 3.5–5.1)
Sodium: 138 mmol/L (ref 135–145)

## 2020-02-24 LAB — CBC
HCT: 33.4 % — ABNORMAL LOW (ref 39.0–52.0)
Hemoglobin: 10.9 g/dL — ABNORMAL LOW (ref 13.0–17.0)
MCH: 31 pg (ref 26.0–34.0)
MCHC: 32.6 g/dL (ref 30.0–36.0)
MCV: 94.9 fL (ref 80.0–100.0)
Platelets: 229 10*3/uL (ref 150–400)
RBC: 3.52 MIL/uL — ABNORMAL LOW (ref 4.22–5.81)
RDW: 15 % (ref 11.5–15.5)
WBC: 7.9 10*3/uL (ref 4.0–10.5)
nRBC: 0 % (ref 0.0–0.2)

## 2020-02-24 LAB — MAGNESIUM: Magnesium: 1.7 mg/dL (ref 1.7–2.4)

## 2020-02-24 LAB — PROTIME-INR
INR: 1.5 — ABNORMAL HIGH (ref 0.8–1.2)
Prothrombin Time: 17.6 seconds — ABNORMAL HIGH (ref 11.4–15.2)

## 2020-02-24 SURGERY — CARDIOVERSION
Anesthesia: General

## 2020-02-24 MED ORDER — PROPOFOL 10 MG/ML IV BOLUS
INTRAVENOUS | Status: DC | PRN
  Administered 2020-02-24: 20 mg via INTRAVENOUS
  Administered 2020-02-24: 50 mg via INTRAVENOUS

## 2020-02-24 MED ORDER — SODIUM CHLORIDE 0.9 % IV SOLN: INTRAVENOUS | Status: DC | PRN

## 2020-02-24 MED ORDER — AMIODARONE HCL 200 MG PO TABS
200.0000 mg | ORAL_TABLET | Freq: Every day | ORAL | Status: DC
  Administered 2020-02-24 – 2020-02-26 (×3): 200 mg via ORAL
  Filled 2020-02-24 (×3): qty 1

## 2020-02-24 MED ORDER — LIDOCAINE 2% (20 MG/ML) 5 ML SYRINGE
INTRAMUSCULAR | Status: DC | PRN
  Administered 2020-02-24: 60 mg via INTRAVENOUS

## 2020-02-24 MED ORDER — METOPROLOL SUCCINATE ER 25 MG PO TB24
25.0000 mg | ORAL_TABLET | Freq: Every day | ORAL | Status: DC
  Administered 2020-02-24 – 2020-02-26 (×3): 25 mg via ORAL
  Filled 2020-02-24 (×3): qty 1

## 2020-02-24 MED ORDER — POTASSIUM CHLORIDE CRYS ER 20 MEQ PO TBCR
40.0000 meq | EXTENDED_RELEASE_TABLET | Freq: Once | ORAL | Status: DC
  Filled 2020-02-24: qty 2

## 2020-02-24 MED ORDER — POTASSIUM CHLORIDE CRYS ER 20 MEQ PO TBCR
40.0000 meq | EXTENDED_RELEASE_TABLET | Freq: Two times a day (BID) | ORAL | Status: DC
  Administered 2020-02-24 – 2020-02-26 (×6): 40 meq via ORAL
  Filled 2020-02-24 (×5): qty 2

## 2020-02-24 NOTE — ED Notes (Signed)
Patient resting no complaints at this time

## 2020-02-24 NOTE — Transfer of Care (Signed)
Immediate Anesthesia Transfer of Care Note  Patient: Darryl Diaz  Procedure(s) Performed: CARDIOVERSION (N/A )  Patient Location: Endoscopy Unit  Anesthesia Type:General  Level of Consciousness: awake, alert  and oriented  Airway & Oxygen Therapy: Patient Spontanous Breathing  Post-op Assessment: Report given to RN  Post vital signs: Reviewed and stable  Last Vitals:  Vitals Value Taken Time  BP    Temp    Pulse    Resp    SpO2      Last Pain:  Vitals:   02/24/20 0916  TempSrc: Oral  PainSc: 0-No pain         Complications: No complications documented.

## 2020-02-24 NOTE — ED Notes (Signed)
SpO2 noted to decreased to 79% while sleeping with no signs of respiratory distress noted, pt placed on 2L O2 via [place], SpO2 now 96% on same.

## 2020-02-24 NOTE — Interval H&P Note (Signed)
History and Physical Interval Note:  02/24/2020 10:18 AM  Darryl Diaz  has presented today for surgery, with the diagnosis of AFIB.  The various methods of treatment have been discussed with the patient and family. After consideration of risks, benefits and other options for treatment, the patient has consented to  Procedure(s): CARDIOVERSION (N/A) as a surgical intervention.  The patient's history has been reviewed, patient examined, no change in status, stable for surgery.  I have reviewed the patient's chart and labs.  Questions were answered to the patient's satisfaction.     [name] A Kacyn Souder

## 2020-02-24 NOTE — CV Procedure (Signed)
   Electrical Cardioversion Procedure Note Darryl Diaz [id number] [birth year]  Procedure: Electrical Cardioversion Indications:  Atrial Fibrillation  Time Out: Verified patient identification, verified procedure,medications/allergies/relevent history reviewed, required imaging and test results available.  Performed  Procedure Details  The patient was NPO after midnight. Anesthesia was administered at the beside  by Dr.Whitman with 70 mg of propofol.  Cardioversion was done with synchronized biphasic defibrillation with AP pads with 200 J.  The patient converted to normal sinus rhythm. The patient tolerated the procedure well   IMPRESSION:  Successful cardioversion of atrial fibrillation    Darryl Diaz A Moani Weipert 02/24/2020, 10:32 AM

## 2020-02-24 NOTE — Evaluation (Signed)
Physical Therapy Evaluation Patient Details Name: Darryl Diaz MRN: [id number] DOB: 09-14-1940 Today's Date: 02/24/2020   History of Present Illness  79yo male presenting with history of syncope and A-fib with RVR. Note recent hospital admission 9/29-10/1 also for A-fib with RVR during which he received cardioversion. He now returns after syncopal episode which occurred in his car prior to leaving home. Received an additional cardioversion 10/18. 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 Able to transfer back to stretcher with PT and holding onto stretcher/counter to pivot back to bed but shaky and unsteady. Will need +2 assist for safety to progress mobility- when sidestepping along side of stretcher with PT, suddenly began shaking violently and needed assist in lowering down to the edge of the stretcher to prevent a fall. Left positioned to comfort in ED stretcher with all needs met this afternoon. Would be very concerned about him returning home alone given intense shaking with intentional mobility, gross impairments in strength and balance, poor safety awareness, and impulsivity. Would benefit from ongoing rehab prior to return home.   Patient suffers from significant functional muscle weakness, impaired balance, high fall risk  which impairs their ability to perform daily activities like safely ambulate independently in the home.  A walker alone will not resolve the issues with performing activities of daily living. A wheelchair will allow patient to safely perform daily activities.  The  patient can self propel in the home or has a caregiver who can provide assistance.        Follow Up Recommendations CIR;Supervision for mobility/OOB    Equipment Recommendations  Wheelchair (measurements PT);Wheelchair cushion (measurements PT)    Recommendations for Other Services       Precautions / Restrictions Precautions Precautions: Fall Precaution Comments: history of falls, impulsive, randomly develops violent shaking with intentional movements Restrictions Weight Bearing Restrictions: No      Mobility  Bed Mobility Overal bed mobility: Needs Assistance Bed Mobility: Supine to Sit;Sit to Supine     Supine to sit: Supervision Sit to supine: Supervision   General bed mobility comments: S for safety and line management  Transfers Overall transfer level: Needs assistance Equipment used: Rolling walker (2 wheeled) Transfers: Sit to/from [facility] Sit to Stand: Min guard Stand pivot transfers: Max assist       General transfer comment: min guard to come to full upright standing position with RW; when PT entered room, he was in the middle of trying to pivot to [facility] with no device with NT, ended up needing PT to stabilize him at hips with maxA for balance while NT kept BSC from sliding out from under him  Ambulation/Gait             General Gait Details: able to perform side steps at EOB but after approximately 5-6 steps suddenly began shaking violently and needed to quickly sit down at edge of stretcher  Stairs            Wheelchair Mobility    Modified Rankin (Stroke Patients Only)       Balance Overall balance assessment: Needs  assistance Sitting-balance support: Feet supported;Bilateral upper extremity supported Sitting balance-Leahy Scale: Fair Sitting balance - Comments: close S for safety   Standing balance support: Bilateral upper extremity supported;During functional activity Standing balance-Leahy Scale: Poor Standing  balance comment: heavy reliance on BUE support, shakes violently with intentional movement                             Pertinent Vitals/Pain Pain Assessment: Faces Faces Pain Scale: No hurt Pain Intervention(s): Limited activity within patient's tolerance;Monitored during session;Repositioned    Home Living Family/patient expects to be discharged to:: Private residence Living Arrangements: Alone Available Help at Discharge: Family;Available PRN/intermittently (sister lives in [place] and can check on him) Type of Home: House Home Access: Level entry     Home Layout: Two level;Able to live on main level with bedroom/bathroom;Full bath on main level Home Equipment: Walker - 2 wheels;Walker - 4 wheels;Bedside commode Additional Comments: pt drives and does his own grocery shopping. patient reports prior to COVID he was swimming 2-3x/week at [facility] and would like to get back to that.    Prior Function Level of Independence: Independent with assistive device(s)         Comments: has been using RW and rollator at home; reports he manages his own medicines and bills     Hand Dominance   Dominant Hand: Right    Extremity/Trunk Assessment   Upper Extremity Assessment Upper Extremity Assessment: Generalized weakness    Lower Extremity Assessment Lower Extremity Assessment: Generalized weakness    Cervical / Trunk Assessment Cervical / Trunk Assessment: Kyphotic  Communication   Communication: No difficulties  Cognition Arousal/Alertness: Awake/alert Behavior During Therapy: Anxious;Impulsive Overall Cognitive Status: Impaired/Different from baseline Area of Impairment: Attention;Memory;Following commands;Safety/judgement;Awareness;Problem solving                   Current Attention Level: Sustained Memory: Decreased short-term memory Following Commands: Follows one step commands consistently;Follows one step commands with increased time Safety/Judgement:  Decreased awareness of safety;Decreased awareness of deficits Awareness: Intellectual Problem Solving: Decreased initiation;Difficulty sequencing;Requires verbal cues;Requires tactile cues General Comments: very poor safety awareness/awareness of functional deficits and impulsive. Insists that he will be fine walking with rollator after developing violent shaking and having to sit down side stepping beside bed. Poor insight into deficits.      General Comments General comments (skin integrity, edema, etc.): VSS on RA s/p cardioversion    Exercises     Assessment/Plan    PT Assessment Patient needs continued PT services  PT Problem List Decreased strength;Decreased activity tolerance;Decreased balance;Decreased mobility;Decreased cognition;Decreased knowledge of use of DME;Decreased safety awareness;Cardiopulmonary status limiting activity;Pain;Decreased skin integrity;Decreased coordination       PT Treatment Interventions DME instruction;Gait training;Functional mobility training;Therapeutic activities;Therapeutic exercise;Balance training;Neuromuscular re-education;Cognitive remediation;Patient/family education;Modalities    PT Goals (Current goals can be found in the Care Plan section)  Acute Rehab PT Goals Patient Stated Goal: return home PT Goal Formulation: With patient Time For Goal Achievement: 03/09/20 Potential to Achieve Goals: Fair    Frequency Min 3X/week   Barriers to discharge Decreased caregiver support      Co-evaluation               AM-PAC PT "6 Clicks" Mobility  Outcome Measure Help needed turning from your back to your side while in a flat bed without using bedrails?: A Little Help needed moving from lying on your back to sitting on the side of a flat  bed without using bedrails?: A Little Help needed moving to and from a bed to a chair (including a wheelchair)?: A Lot Help needed standing up from a chair using your arms (e.g., wheelchair or bedside  chair)?: A Little Help needed to walk in hospital room?: A Lot Help needed climbing 3-5 steps with a railing? : Total 6 Click Score: 14    End of Session Equipment Utilized During Treatment: Gait belt Activity Tolerance: Patient tolerated treatment well Patient left: in bed;with call bell/phone within reach (ED stretcher) Nurse Communication: Mobility status;Precautions PT Visit Diagnosis: History of falling (Z91.81);Muscle weakness (generalized) (M62.81);Difficulty in walking, not elsewhere classified (R26.2);Pain;Unsteadiness on feet (R26.81);Repeated falls (R29.6)    Time: 6606-0045 PT Time Calculation (min) (ACUTE ONLY): 26 min   Charges:   PT Evaluation $PT Eval High Complexity: 1 High PT Treatments $Therapeutic Activity: 8-22 mins        Windell Norfolk, DPT, PN1   Supplemental Physical Therapist [place]    Pager [phone] Acute Rehab Office [phone]

## 2020-02-24 NOTE — Progress Notes (Signed)
Progress Note  Patient Name: Darryl Diaz Date of Encounter: 02/24/2020  Primary Cardiologist: Sherren Mocha, MD   Subjective   Patient very happy to be back in rhythm after DCCV. No chest pain or SOB. Really wants to go home  Myoview with small inferior defect likely diaphragmatic attenuation. No reversibility. Otherwise no ischemia.  Net negative 1.275 L TTE pending.  Inpatient Medications    Scheduled Meds: . amiodarone  200 mg Oral Daily  . apixaban  5 mg Oral BID  . atorvastatin  20 mg Oral Daily  . escitalopram  10 mg Oral Daily  . ferrous sulfate  325 mg Oral BID WC  . folic acid  1 mg Oral Daily  . furosemide  40 mg Oral Daily  . loratadine  10 mg Oral Daily  . metoprolol succinate  25 mg Oral Daily  . miconazole nitrate   Topical BID  . pantoprazole  40 mg Oral QAC breakfast  . sodium chloride flush  3 mL Intravenous Once  . vitamin B-1  250 mg Oral Daily   Continuous Infusions:  PRN Meds: acetaminophen, nitroGLYCERIN, ondansetron (ZOFRAN) IV   Vital Signs    Vitals:   02/24/20 0916 02/24/20 1033 02/24/20 1045 02/24/20 1050  BP: 136/89 113/80 108/64 119/67  Pulse: (!) 119 70 (!) 50 69  Resp: (!) 25 17 13 19   Temp: 100.2 F (37.9 C)     TempSrc: Oral Oral    SpO2: 99% 98% 94% 99%  Weight: 106.6 kg     Height: 6' (1.829 m)       Intake/Output Summary (Last 24 hours) at 02/24/2020 1100 Last data filed at 02/24/2020 1030 Gross per 24 hour  Intake 200 ml  Output --  Net 200 ml   Filed Weights   02/22/20 1144 02/23/20 0922 02/24/20 0916  Weight: 106.6 kg 106.6 kg 106.6 kg    Telemetry    Unable to review but HR in the 120s-140s on EKG monitor at the time of his stress test.  - Personally Reviewed  ECG    No new tracings - Personally Reviewed  Physical Exam   GEN: No acute distress.   Neck: No JVD, no carotid bruits Cardiac: RRR, no murmurs  Respiratory: Clear to auscultation bilaterally, no wheezes/ rales/ rhonchi GI: Soft,  ND, +BS MS: Trace edema, warm Neuro:  Nonfocal, moving all extremities spontaneously Psych: Normal affect   Labs    Chemistry Recent Labs  Lab 02/18/20 1625 02/18/20 1625 02/22/20 1141 02/23/20 0349 02/24/20 0500  NA 137  --  136 138 138  K 4.7   < > 3.5 3.8 3.0*  CL 100   < > 98 100 100  CO2 22   < > 24 24 25   GLUCOSE 82  --  80 136* 128*  BUN 13  --  10 12 10   CREATININE 1.06   < > 1.17 1.31* 1.35*  CALCIUM 8.8   < > 8.3* 8.8* 8.7*  GFRNONAA 67  --  59* 52* 50*  GFRAA 77  --   --   --   --   ANIONGAP  --   --  14 14 13    < > = values in this interval not displayed.     Hematology Recent Labs  Lab 02/22/20 1141 02/23/20 0349 02/24/20 0500  WBC 4.9 7.9 7.9  RBC 3.62* 3.70* 3.52*  HGB 11.2* 11.8* 10.9*  HCT 35.4* 35.6* 33.4*  MCV 97.8 96.2 94.9  MCH 30.9  31.9 31.0  MCHC 31.6 33.1 32.6  RDW 15.1 15.1 15.0  PLT 295 291 229    Cardiac EnzymesNo results for input(s): TROPONINI in the last 168 hours. No results for input(s): TROPIPOC in the last 168 hours.   BNP Recent Labs  Lab 02/22/20 1433  BNP 266.2*     DDimer  Recent Labs  Lab 02/22/20 2051  DDIMER 0.38     Radiology    NM Myocar Multi W/Spect W/Wall Motion / EF  Result Date: 02/23/2020  There was no ST segment deviation noted during stress.  The study is normal.  This is a low risk study.  The left ventricular ejection fraction is normal (55-65%).    DG Chest Portable 1 View  Result Date: 02/22/2020 CLINICAL DATA:  Syncope EXAM: PORTABLE CHEST 1 VIEW COMPARISON:  February 05, 2020 FINDINGS: Lungs are clear. Heart is borderline enlarged with pulmonary vascularity normal. No adenopathy. No bone lesions. IMPRESSION: Borderline cardiac enlargement.  Lungs clear. Electronically Signed   By: Lowella Grip III M.D.   On: 02/22/2020 13:51    Cardiac Studies   Echocardiogram 02/06/20: 1. Left ventricular ejection fraction, by estimation, is 60 to 65%. The  left ventricle has normal  function. The left ventricle has no regional  wall motion abnormalities. Left ventricular diastolic parameters are  indeterminate.  2. There is mild hyperkinesis of the RV apex relative to the RV free  wall. Recommend clinical correlation if concern for underlying pulmonary  embolism.   . Right ventricular systolic function is low normal. The right  ventricular size is mildly enlarged. There is mildly elevated pulmonary  artery systolic pressure.  3. Left atrial size was mildly dilated.  4. The mitral valve is normal in structure. Suspect at least mild,  eccentric mitral regurgitation secondary to tethering of the posterior  mitral leaflet. mitral valve regurgitation.  5. The aortic valve is tricuspid. There is mild calcification of the  aortic valve. There is moderate thickening of the aortic valve. Aortic  valve regurgitation is not visualized. Mild to moderate aortic valve  sclerosis/calcification is present, without  any evidence of aortic stenosis.  6. Aortic dilatation noted. There is mild dilatation of the ascending  aorta, measuring 40 mm. There is mild dilatation of the aortic root,  measuring 39 mm.  7. The inferior vena cava is dilated in size with <50% respiratory  variability, suggesting right atrial pressure of 15 mmHg.   Patient Profile     79 y.o. 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  Assessment & Plan    1. Syncope: patient presented with sudden loss of consciousness while sitting in his car. No prodromal symptoms. EKG on arrival with afib with RVR but non-ischemic. Recent echo 01/2020 showed EF 60-65%, indeterminate LV diastolic function, and RV findings suspicious for possible PE. DDimer negative this admission. No chest pain,  SOB, or DOE complaints.  He did not start flecainide as recently recommended. Symptoms c/f cardiogenic syncope. No prior ischemic evaluation.  -Await limited echo to re-evaluate LV/RV function -Back in sinus rhythm s/p DCCV -Myoview with no ischemia, normal EF -If TTE normal, okay to discharge home today given reassuring work-up -Will have close follow-up with Afib clinic for further management   2. Persistent atrial fibrillation: s/p recent successful DCCV 02/06/20  with reoccurrence shortly after discharge. Was recommended to start flecainide and planned for DCCV on Monday, however he did not pick up his flecainide prescription. He has had trouble with bradycardia in the past resulting in a bradycardic arrest. He was started on a diltiazem gtt - uptitrated to 15/hr. HR in the 90s-120s this morning.  -S/p successful DCCV now back in NSR -Start amiodarone 200mg  daily to try to keep in rhythm; will not start flecainide for now until re-evaluation by Afib clinic -Resume metop 25mg  XL -Continue xarelto for [facility] -Will follow-up with Afib clinic for further management  3. Acute on chronic diastolic CHF:  Stable with improved LE edema. -Hold lasix today; can resume taking as needed now that euvolemic -Metop 25mg  XL as above -Trend I/Os and daily weights  4. HTN: BP stable - Metop 50mg  XL -Stop dilt gtt  5. HLD: LDL 29 04/2019 - Continue atorvastatin  6. Groin rash: appears to have a mild yeast infection - Continue miconazole powder   7. Gait instability: RN notes unsteadiness when getting out of bed.  - PT/OT  Pending TTE, can discharge home later today vs tomorrow.  For questions or updates, please contact [place] Please consult [email or web address] for contact info under Cardiology/STEMI.      Signed, Freada Bergeron, MD  02/24/2020, 11:00 AM   [phone]

## 2020-02-24 NOTE — Anesthesia Postprocedure Evaluation (Signed)
Anesthesia Post Note  Patient: Darryl Diaz  Procedure(s) Performed: CARDIOVERSION (N/A )     Patient location during evaluation: Endoscopy Anesthesia Type: MAC Level of consciousness: awake and alert Pain management: pain level controlled Vital Signs Assessment: post-procedure vital signs reviewed and stable Respiratory status: spontaneous breathing, nonlabored ventilation and respiratory function stable Cardiovascular status: blood pressure returned to baseline and stable Postop Assessment: no apparent nausea or vomiting Anesthetic complications: no   No complications documented.  Last Vitals:  Vitals:   02/24/20 1230 02/24/20 1300  BP: (!) 89/70 132/73  Pulse: 86 79  Resp: (!) 23 17  Temp:    SpO2: 97% 98%    Last Pain:  Vitals:   02/24/20 1050  TempSrc:   PainSc: 0-No pain                 Lidia Collum

## 2020-02-24 NOTE — Anesthesia Preprocedure Evaluation (Signed)
Anesthesia Evaluation  Patient identified by MRN, date of birth, ID band Patient awake    Reviewed: Allergy & Precautions, NPO status , Patient's Chart, lab work & pertinent test results  History of Anesthesia Complications Negative for: history of anesthetic complications  Airway Mallampati: II  TM Distance: >3 FB Neck ROM: Full    Dental   Pulmonary sleep apnea , former smoker,    Pulmonary exam normal        Cardiovascular hypertension, + Past MI  + dysrhythmias Atrial Fibrillation  Rhythm:Irregular     Neuro/Psych negative neurological ROS  negative psych ROS   GI/Hepatic Neg liver ROS, GERD  ,  Endo/Other  negative endocrine ROS  Renal/GU Renal InsufficiencyRenal disease  negative genitourinary   Musculoskeletal  (+) Arthritis ,   Abdominal   Peds  Hematology  (+) anemia ,   Anesthesia Other Findings  Myoview 02/23/20:   There was no ST segment deviation noted during stress.  The study is normal.  This is a low risk study.  The left ventricular ejection fraction is normal (55-65%).  Reproductive/Obstetrics                             Anesthesia Physical Anesthesia Plan  ASA: III  Anesthesia Plan: General   Post-op Pain Management:    Induction: Intravenous  PONV Risk Score and Plan: 2 and TIVA and Treatment may vary due to age or medical condition  Airway Management Planned: Mask  Additional Equipment: None  Intra-op Plan:   Post-operative Plan:   Informed Consent: I have reviewed the patients History and Physical, chart, labs and discussed the procedure including the risks, benefits and alternatives for the proposed anesthesia with the patient or authorized representative who has indicated his/her understanding and acceptance.       Plan Discussed with:   Anesthesia Plan Comments:         Anesthesia Quick Evaluation

## 2020-02-25 DIAGNOSIS — I4819 Other persistent atrial fibrillation: Secondary | ICD-10-CM | POA: Diagnosis not present

## 2020-02-25 DIAGNOSIS — I5033 Acute on chronic diastolic (congestive) heart failure: Secondary | ICD-10-CM | POA: Diagnosis not present

## 2020-02-25 DIAGNOSIS — R55 Syncope and collapse: Secondary | ICD-10-CM | POA: Diagnosis not present

## 2020-02-25 LAB — CBC
HCT: 33.6 % — ABNORMAL LOW (ref 39.0–52.0)
Hemoglobin: 10.8 g/dL — ABNORMAL LOW (ref 13.0–17.0)
MCH: 31.2 pg (ref 26.0–34.0)
MCHC: 32.1 g/dL (ref 30.0–36.0)
MCV: 97.1 fL (ref 80.0–100.0)
Platelets: 216 10*3/uL (ref 150–400)
RBC: 3.46 MIL/uL — ABNORMAL LOW (ref 4.22–5.81)
RDW: 14.7 % (ref 11.5–15.5)
WBC: 11.3 10*3/uL — ABNORMAL HIGH (ref 4.0–10.5)
nRBC: 0 % (ref 0.0–0.2)

## 2020-02-25 LAB — BASIC METABOLIC PANEL
Anion gap: 8 (ref 5–15)
BUN: 11 mg/dL (ref 8–23)
CO2: 24 mmol/L (ref 22–32)
Calcium: 8.7 mg/dL — ABNORMAL LOW (ref 8.9–10.3)
Chloride: 104 mmol/L (ref 98–111)
Creatinine, Ser: 0.99 mg/dL (ref 0.61–1.24)
GFR, Estimated: >60 mL/min (ref >60)
Glucose, Bld: 137 mg/dL — ABNORMAL HIGH (ref 70–99)
Potassium: 3.8 mmol/L (ref 3.5–5.1)
Sodium: 136 mmol/L (ref 135–145)

## 2020-02-25 LAB — ECHOCARDIOGRAM LIMITED
Height: 72 in
Weight: 3760 oz

## 2020-02-25 LAB — GLUCOSE, CAPILLARY: Glucose-Capillary: 108 mg/dL — ABNORMAL HIGH (ref 70–99)

## 2020-02-25 LAB — MAGNESIUM: Magnesium: 1.3 mg/dL — ABNORMAL LOW (ref 1.7–2.4)

## 2020-02-25 LAB — MRSA PCR SCREENING: MRSA by PCR: NEGATIVE

## 2020-02-25 MED ORDER — IPRATROPIUM BROMIDE 0.02 % IN SOLN
0.5000 mg | Freq: Four times a day (QID) | RESPIRATORY_TRACT | Status: DC | PRN
  Administered 2020-02-25: 0.5 mg via RESPIRATORY_TRACT
  Filled 2020-02-25: qty 2.5

## 2020-02-25 NOTE — Progress Notes (Signed)
0450: On admission to unit pt lung sounds were clear bilaterally in upper lobes and diminished in lower lobes. Approx 0430 pt called out and c/o of excessive phlegm and wanted someone to listen to his lungs. Upon assessment RN noticed rhonchi bilaterally in upper and lower lobes. RN provided pt with Yankauer for excessive phlegm and paged on call MD. No new orders were given. Will continue to monitor.   4259: Pt was able to cough up large amount of phlegm and is now more comfortable. Pt stated that Yankauer really helped and is feeling much better. Will continue to monitor.

## 2020-02-25 NOTE — Progress Notes (Addendum)
Progress Note  Patient Name: Darryl Diaz Date of Encounter: 02/25/2020  Primary Cardiologist: Sherren Mocha, MD   Subjective  Working with OT and requiring a lot of assistance. Agreeable to in-patient rehab. No chest pain or SOB  TTE pending Inpatient Medications    Scheduled Meds: . amiodarone  200 mg Oral Daily  . apixaban  5 mg Oral BID  . atorvastatin  20 mg Oral Daily  . escitalopram  10 mg Oral Daily  . ferrous sulfate  325 mg Oral BID WC  . folic acid  1 mg Oral Daily  . loratadine  10 mg Oral Daily  . metoprolol succinate  25 mg Oral Daily  . miconazole nitrate   Topical BID  . pantoprazole  40 mg Oral QAC breakfast  . potassium chloride  40 mEq Oral Once  . potassium chloride  40 mEq Oral BID  . sodium chloride flush  3 mL Intravenous Once  . vitamin B-1  250 mg Oral Daily   Continuous Infusions:  PRN Meds: acetaminophen, nitroGLYCERIN, ondansetron (ZOFRAN) IV   Vital Signs    Vitals:   02/25/20 0016 02/25/20 0104 02/25/20 0300 02/25/20 0700  BP:  (!) 150/86  (!) 124/92  Pulse:  89  99  Resp:  18  18  Temp: 98 F (36.7 C) 98.4 F (36.9 C) (!) 97.5 F (36.4 C) 97.9 F (36.6 C)  TempSrc: Oral Oral Axillary Axillary  SpO2:  92%  96%  Weight:  101.6 kg    Height:  6' (1.829 m)      Intake/Output Summary (Last 24 hours) at 02/25/2020 0747 Last data filed at 02/25/2020 0104 Gross per 24 hour  Intake 660 ml  Output --  Net 660 ml   Filed Weights   02/23/20 0922 02/24/20 0916 02/25/20 0104  Weight: 106.6 kg 106.6 kg 101.6 kg    Telemetry    Sinus tachycardia with HR 100-110s.  - Personally Reviewed  ECG    No new tracings - Personally Reviewed  Physical Exam   GEN: Comfortable Neck: No JVD, no carotid bruits Cardiac: Tachycardic, regular, no murmurs  Respiratory: Clear to auscultation bilaterally, no wheezes/ rales/ rhonchi GI: Soft, ND, +BS MS: Trace edema, warm Neuro:  Nonfocal, moving all extremities spontaneously Psych:  Normal affect   Labs    Chemistry Recent Labs  Lab 02/18/20 1625 02/18/20 1625 02/22/20 1141 02/23/20 0349 02/24/20 0500  NA 137  --  136 138 138  K 4.7   < > 3.5 3.8 3.0*  CL 100   < > 98 100 100  CO2 22   < > 24 24 25   GLUCOSE 82  --  80 136* 128*  BUN 13  --  10 12 10   CREATININE 1.06   < > 1.17 1.31* 1.35*  CALCIUM 8.8   < > 8.3* 8.8* 8.7*  GFRNONAA 67  --  59* 52* 50*  GFRAA 77  --   --   --   --   ANIONGAP  --   --  14 14 13    < > = values in this interval not displayed.     Hematology Recent Labs  Lab 02/22/20 1141 02/23/20 0349 02/24/20 0500  WBC 4.9 7.9 7.9  RBC 3.62* 3.70* 3.52*  HGB 11.2* 11.8* 10.9*  HCT 35.4* 35.6* 33.4*  MCV 97.8 96.2 94.9  MCH 30.9 31.9 31.0  MCHC 31.6 33.1 32.6  RDW 15.1 15.1 15.0  PLT 295 291 229  Cardiac EnzymesNo results for input(s): TROPONINI in the last 168 hours. No results for input(s): TROPIPOC in the last 168 hours.   BNP Recent Labs  Lab 02/22/20 1433  BNP 266.2*     DDimer  Recent Labs  Lab 02/22/20 2051  DDIMER 0.38     Radiology    NM Myocar Multi W/Spect W/Wall Motion / EF  Result Date: 02/23/2020  There was no ST segment deviation noted during stress.  The study is normal.  This is a low risk study.  The left ventricular ejection fraction is normal (55-65%).     Cardiac Studies  TTE pending  Echocardiogram 02/06/20: 1. Left ventricular ejection fraction, by estimation, is 60 to 65%. The  left ventricle has normal function. The left ventricle has no regional  wall motion abnormalities. Left ventricular diastolic parameters are  indeterminate.  2. There is mild hyperkinesis of the RV apex relative to the RV free  wall. Recommend clinical correlation if concern for underlying pulmonary  embolism.   . Right ventricular systolic function is low normal. The right  ventricular size is mildly enlarged. There is mildly elevated pulmonary  artery systolic pressure.  3. Left atrial size  was mildly dilated.  4. The mitral valve is normal in structure. Suspect at least mild,  eccentric mitral regurgitation secondary to tethering of the posterior  mitral leaflet. mitral valve regurgitation.  5. The aortic valve is tricuspid. There is mild calcification of the  aortic valve. There is moderate thickening of the aortic valve. Aortic  valve regurgitation is not visualized. Mild to moderate aortic valve  sclerosis/calcification is present, without  any evidence of aortic stenosis.  6. Aortic dilatation noted. There is mild dilatation of the ascending  aorta, measuring 40 mm. There is mild dilatation of the aortic root,  measuring 39 mm.  7. The inferior vena cava is dilated in size with <50% respiratory  variability, suggesting right atrial pressure of 15 mmHg.   Patient Profile     79 y.o. 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  Assessment & Plan    1. Syncope: patient presented with sudden loss of consciousness while sitting in his car. No prodromal symptoms. EKG on arrival with afib with RVR but non-ischemic. Recent echo 01/2020 showed EF 60-65%, indeterminate LV diastolic function, and RV findings suspicious for possible PE. DDimer negative this admission. No chest pain, SOB, or DOE complaints.  He did not start flecainide as recently recommended. Symptoms c/f cardiogenic syncope. Negative ischemic work-up. Concern for ? Unstable afib with RVR now back in NSR. -Await limited echo to re-evaluate LV/RV function; pre-lim evaluation looks unchanged from prior -S/p DCCV 10/18 now back in sinus rhythm -Myoview with no ischemia, normal EF -Per PT, recommend CIR for mobility and OOB; awaiting CIR consult  2. Persistent atrial fibrillation:   -S/p  successful repeat DCCV on 10/17 now back in NSR -Continue amiodarone 200mg  daily -Continue metop 25mg  XL -Continue xarelto for [facility] -Will follow-up with Afib clinic for further management  3. Chronic diastolic CHF:  Stable with improved LE edema. -Metop 25mg  XL as above -Trend I/Os and daily weights -Resume prn lasix 40mg  PO  4. HTN: BP stable - Metop 50mg  XL  5. HLD: LDL 29 04/2019 - Continue atorvastatin  6. Groin rash: appears  to have a mild yeast infection - Continue miconazole powder   7. Gait instability: RN notes unsteadiness when getting out of bed.  - Per PT, recommend CIR  - Appreciate PT/OT recommendations  - Awaiting in-patient rehab evaluation  Pending TTE and in-patient rehab evaluation. Likely needs in-patient rehab prior to home.  For questions or updates, please contact [place] Please consult [email or web address] for contact info under Cardiology/STEMI.      Signed, Freada Bergeron, MD  02/25/2020, 7:47 AM   [phone]

## 2020-02-25 NOTE — Progress Notes (Signed)
Inpatient Rehab Admissions:  Inpatient Rehab Consult received.  I met with patient at the bedside for rehabilitation assessment and to discuss goals and expectations of an inpatient rehab admission.  Pt with very limited support available at discharge, and would need to reach mod I level in order to return home.  He does report he has some neighbors and church friends who can check on him, but no one could provide 24/7 supervision.  He is on the waiting list at [facility] and (?) [facility] for [place], but unsure how close he is to having a place.  Does appear that he is relatively cognitively intact during my conversation with him, but he seems to demonstrate some safety awareness deficits with therapy.  During my discussion with him, HR ranged from 101-135.  RN aware. He would likely benefit from some post-acute rehab, and is not interested in SNF at this time.  Feel he could benefit from CIR, once HR controlled, if he does not progress to mod I.    Signed: Shann Medal, PT, DPT Admissions Coordinator [phone] 02/25/20  4:37 PM

## 2020-02-25 NOTE — Progress Notes (Signed)
Occupational Therapy Evaluation Patient Details Name: Darryl Diaz MRN: [id number] DOB: [birth year] Today's Date: 02/25/2020    History of Present Illness 79yo male presenting with history of syncope and A-fib with RVR. Note recent hospital admission 9/29-10/1 also for A-fib with RVR during which he received cardioversion. He now returns after syncopal episode which occurred in his car prior to leaving home. Received an additional cardioversion 10/18. 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 Drives, manages his own medicine and finances - used to own restaurants up [place]. He does wear a life alert button at all times due to history of falling. Today Pt was max A for LB ADL (he does have some AE to assist with this at home) as well as mod to min A +2 for transfers from various surfaces. With activity his HR elevated from 100-mid 120's, and SpO2 dropped to mid 80's. Pt requires frequent seated rest breaks with cues for pursed lip breathing. Pt has decreased safety awareness, decreased balance and activity tolerance. Pt will benefit from skilled OT in the acute setting as well as at CIR level post-acute to maximize safety and independence in ADL and functional transfers. Pt is eager and willing to work with therapy and perseverates on "I just want to do the right thing, get stronger, and make the right choices" throughout session. Next session to focus on higher level cog assessment (pill box test?) as well as OOB activity with ADL.     Follow Up Recommendations  CIR    Equipment Recommendations  None recommended by OT    Recommendations for Other Services Rehab consult     Precautions / Restrictions Precautions Precautions: Fall Precaution Comments: history  of falls, impulsive, randomly develops violent shaking with intentional movements Restrictions Weight Bearing Restrictions: No      Mobility Bed Mobility Overal bed mobility: Needs Assistance Bed Mobility: Supine to Sit     Supine to sit: Min guard     General bed mobility comments: min guad for line management and safety  Transfers Overall transfer level: Needs assistance Equipment used: Rolling walker (2 wheeled) Transfers: Sit to/from Stand Sit to Stand: Min assist         General transfer comment: attempted Rollator initially at Pt request. It was much to unsteady, better with 2 wheeled RW, initial sit<>stand from bed was attempted x4 before Pt had success with Min A from therapy team. Pt with cues for safety and sequencing and fatigues quickly and requests to sit down frequently    Balance Overall balance assessment: Needs assistance Sitting-balance support: Feet supported;Bilateral upper extremity supported Sitting balance-Leahy Scale: Fair     Standing balance support: Bilateral upper extremity supported;During functional activity Standing balance-Leahy Scale: Poor Standing balance comment: heavy reliance on BUE support                           ADL either performed or assessed with clinical judgement   ADL Overall ADL's : Needs assistance/impaired Eating/Feeding: Set up;Sitting   Grooming: Wash/dry hands;Wash/dry face;Brushing hair;Sitting Grooming Details (indicate cue type and reason): EOB Upper Body Bathing: Minimal assistance;Sitting   Lower Body Bathing: Sitting/lateral leans;Sit to/from stand;Moderate assistance   Upper Body Dressing : Minimal assistance;Sitting  Lower Body Dressing: Maximal assistance;Sitting/lateral leans;Sit to/from stand Lower Body Dressing Details (indicate cue type and reason): to don shoes - Pt reports that he has a long handle shoe horn at home that he uses Toilet Transfer: Minimal assistance;+2 for physical  assistance;+2 for safety/equipment;Cueing for safety;Cueing for sequencing;Ambulation;RW Toilet Transfer Details (indicate cue type and reason): attempted to stand from EOB x4 before success with min A, unsafe hand placement and decreased safety awareness.  Toileting- Clothing Manipulation and Hygiene: Moderate assistance;Sit to/from stand Toileting - Clothing Manipulation Details (indicate cue type and reason): needs assist for thoroughness with rear peri care     Functional mobility during ADLs: Moderate assistance;+2 for safety/equipment;Minimal assistance;Cueing for safety;Rolling walker (initially with Rollator, but much too unsteady) General ADL Comments: Pt with decreased safety awareness, balance, frequent falls in home environment, need to assess higher cognition since Pt manages his own bills and medication     Vision Baseline Vision/History: Wears glasses Wears Glasses: At all times Patient Visual Report: No change from baseline Vision Assessment?: No apparent visual deficits     Perception     Praxis      Pertinent Vitals/Pain Pain Assessment: Faces Faces Pain Scale: No hurt Pain Intervention(s): Monitored during session     Hand Dominance Right   Extremity/Trunk Assessment Upper Extremity Assessment Upper Extremity Assessment: Generalized weakness   Lower Extremity Assessment Lower Extremity Assessment: Defer to PT evaluation   Cervical / Trunk Assessment Cervical / Trunk Assessment: Kyphotic   Communication Communication Communication: No difficulties   Cognition Arousal/Alertness: Awake/alert Behavior During Therapy: Anxious;Impulsive Overall Cognitive Status: Impaired/Different from baseline Area of Impairment: Attention;Memory;Following commands;Safety/judgement;Awareness;Problem solving                   Current Attention Level: Sustained Memory: Decreased short-term memory Following Commands: Follows one step commands  consistently Safety/Judgement: Decreased awareness of safety;Decreased awareness of deficits Awareness: Emergent Problem Solving: Difficulty sequencing;Requires verbal cues General Comments: perseverating on "wanting to do the right thing"   General Comments  Pt on 2L O2 initially, on RA at rest was able to maintain >91% SpO2.  With activity (short ambulation with DME) HR elevated to 128, with seated rest breaks would slow to close to 100. SpO2 dropped to mid 80's on RA and able to bring back up >90% with PLB and seated rest break.    Exercises     Shoulder Instructions      Home Living Family/patient expects to be discharged to:: Private residence Living Arrangements: Alone Available Help at Discharge: Family;Available PRN/intermittently;Neighbor (sister in [place] and she can check on him) Type of Home: Other(Comment) (townhome) Home Access: Level entry     Home Layout: Two level;Able to live on main level with bedroom/bathroom;Full bath on main level Alternate Level Stairs-Number of Steps: does not go upstairs   Bathroom Shower/Tub: Occupational psychologist: Handicapped height Bathroom Accessibility: Yes How Accessible: Accessible via walker Home Equipment: [place] - 2 wheels;Walker - 4 wheels;Bedside commode;Wheelchair - manual;Shower seat - built in;Hand held shower head;Adaptive equipment Adaptive Equipment: Long-handled shoe horn Additional Comments: pt drives and does his own grocery shopping. patient reports prior to COVID he was swimming 2-3x/week at [facility] and would like to get back to that.      Prior Functioning/Environment Level of Independence: Independent with assistive device(s)        Comments: has been using RW and rollator at home; reports he manages his own medicines and bills  OT Problem List: Decreased strength;Decreased knowledge of use of DME or AE;Decreased knowledge of precautions;Decreased activity tolerance;Decreased  cognition;Impaired balance (sitting and/or standing);Decreased safety awareness      OT Treatment/Interventions: Self-care/ADL training;Therapeutic exercise;Patient/family education;Balance training;Energy conservation;Therapeutic activities;DME and/or AE instruction;Cognitive remediation/compensation    OT Goals(Current goals can be found in the care plan section) Acute Rehab OT Goals Patient Stated Goal: get stronger and more independent and safer - go to Rehab OT Goal Formulation: With patient Time For Goal Achievement: 03/10/20 Potential to Achieve Goals: Good ADL Goals Pt Will Perform Grooming: with supervision;standing Pt Will Perform Upper Body Dressing: with modified independence;sitting Pt Will Perform Lower Body Dressing: sit to/from stand;with set-up;with adaptive equipment Pt Will Transfer to Toilet: with modified independence;ambulating Pt Will Perform Toileting - Clothing Manipulation and hygiene: with modified independence;sit to/from stand Additional ADL Goal #1: Pt will demonstrate executive level thinking by completing higher level ADL task (medicine mangement or bill pay) with 2 or less cues cues.  OT Frequency: Min 2X/week   Barriers to D/C:            Co-evaluation PT/OT/SLP Co-Evaluation/Treatment: Yes Reason for Co-Treatment: Necessary to address cognition/behavior during functional activity;For patient/therapist safety;To address functional/ADL transfers PT goals addressed during session: Mobility/safety with mobility;Balance;Proper use of DME OT goals addressed during session: ADL's and self-care;Proper use of Adaptive equipment and DME      AM-PAC OT "6 Clicks" Daily Activity     Outcome Measure Help from another person eating meals?: A Little Help from another person taking care of personal grooming?: A Little Help from another person toileting, which includes using toliet, bedpan, or urinal?: A Lot Help from another person bathing (including washing,  rinsing, drying)?: A Lot Help from another person to put on and taking off regular upper body clothing?: A Little Help from another person to put on and taking off regular lower body clothing?: A Lot 6 Click Score: 15   End of Session Equipment Utilized During Treatment: Gait belt;Rolling walker Nurse Communication: Mobility status  Activity Tolerance: Patient tolerated treatment well Patient left: in chair;with call bell/phone within reach;with chair alarm set  OT Visit Diagnosis: Unsteadiness on feet (R26.81);Other abnormalities of gait and mobility (R26.89);Muscle weakness (generalized) (M62.81);History of falling (Z91.81);Other symptoms and signs involving cognitive function                Time: 2025-4270 OT Time Calculation (min): 41 min Charges:  OT General Charges $OT Visit: 1 Visit OT Evaluation $OT Eval Moderate Complexity: 1 Mod OT Treatments $Self Care/Home Management : 8-22 mins  Jesse Sans OTR/L Acute Rehabilitation Services Pager: [phone] Office: [place] 02/25/2020, 10:49 AM

## 2020-02-25 NOTE — Progress Notes (Signed)
Physical Therapy Treatment Patient Details Name: Darryl Diaz MRN: [id number] DOB: 05/08/1941 Today's Date: 02/25/2020    History of Present Illness 79yo male presenting with history of syncope and A-fib with RVR. Note recent hospital admission 9/29-10/1 also for A-fib with RVR during which he received cardioversion. He now returns after syncopal episode which occurred in his car prior to leaving home. Received an additional cardioversion 10/18. 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 States multiple times that he wants to "do the right thing" if he needs rehab before going home. Pt is limited in safe mobility by increased cardiopulmonary response to mobility and decreased safety awareness, in presence of decreased strength and endurance. Pt is currently min guard for bed mobility, min A for transfers and min A for ambulation with need of close chair follow as when pt stops ambulation he needs to sit. Pt is motivated to return to his home and work with therapy to achieve his goals. PT continues to recommend CIR level rehab prior to return home. PT will continue to follow acutely.   Follow Up Recommendations  CIR;Supervision for mobility/OOB     Equipment Recommendations  Other (comment) (TBD at next venue)       Precautions / Restrictions Precautions Precautions: Fall Precaution Comments: history of falls, impulsive, some shaking with intentional movements Restrictions Weight Bearing Restrictions: No    Mobility  Bed Mobility Overal bed mobility: Needs Assistance Bed Mobility: Supine to Sit     Supine to sit: Min guard     General bed mobility comments: min guad for line management and safety  Transfers Overall transfer level: Needs assistance Equipment used: Rolling walker (2 wheeled) Transfers:  Sit to/from Stand Sit to Stand: Min assist         General transfer comment: initial sit<>stand from bed was attempted x4 before Pt had success with Min A from therapy team.   Ambulation/Gait Ambulation/Gait assistance: Min assist Gait Distance (Feet): 25 Feet (1x8', 1x12', 1x25') Assistive device: Rolling walker (2 wheeled);4-wheeled walker Gait Pattern/deviations: Step-through pattern;Decreased step length - right;Decreased step length - left;Shuffle Gait velocity: slowed Gait velocity interpretation: <1.31 ft/sec, indicative of household ambulator General Gait Details: attempted Rollator initially at Pt request. It was much to unsteady, better with 2 wheeled RW, Pt with cues for safety and sequencing and fatigues quickly and requests to sit down frequently, pt with slow mildly unsteady gait, decreased R foot clearnace requiring min A for steadying        Balance Overall balance assessment: Needs assistance Sitting-balance support: Feet supported;Bilateral upper extremity supported Sitting balance-Leahy Scale: Fair     Standing balance support: Bilateral upper extremity supported;During functional activity Standing balance-Leahy Scale: Poor Standing balance comment: heavy reliance on BUE support                            Cognition Arousal/Alertness: Awake/alert Behavior During Therapy: Anxious;Impulsive Overall Cognitive Status: Impaired/Different from baseline Area of Impairment: Attention;Memory;Following commands;Safety/judgement;Awareness;Problem solving                   Current Attention Level: Sustained Memory: Decreased short-term memory Following Commands: Follows one step commands consistently Safety/Judgement: Decreased awareness of safety;Decreased awareness of deficits Awareness: Emergent Problem Solving: Difficulty sequencing;Requires verbal cues General Comments:  perseverating on "wanting to do the right thing"         General  Comments General comments (skin integrity, edema, etc.): Pt on 2L O2 initially, on RA at rest was able to maintain >91% SpO2.  With activity (short ambulation with DME) HR elevated to 128, with seated rest breaks would slow to close to 100. SpO2 dropped to mid 80's on RA and able to bring back up >90% with PLB and seated rest break.      Pertinent Vitals/Pain Pain Assessment: Faces Faces Pain Scale: No hurt Pain Intervention(s): Monitored during session    Home Living Family/patient expects to be discharged to:: Private residence Living Arrangements: Alone Available Help at Discharge: Family;Available PRN/intermittently;Neighbor (sister in [place] and she can check on him) Type of Home: Other(Comment) (townhome) Home Access: Level entry   Home Layout: Two level;Able to live on main level with bedroom/bathroom;Full bath on main level Home Equipment: Walker - 2 wheels;Walker - 4 wheels;Bedside commode;Wheelchair - manual;Shower seat - built in;Hand held shower head;Adaptive equipment Additional Comments: pt drives and does his own grocery shopping. patient reports prior to COVID he was swimming 2-3x/week at [facility] and would like to get back to that.    Prior Function Level of Independence: Independent with assistive device(s)      Comments: has been using RW and rollator at home; reports he manages his own medicines and bills   PT Goals (current goals can now be found in the care plan section) Acute Rehab PT Goals Patient Stated Goal: get stronger and more independent and safer - go to Rehab PT Goal Formulation: With patient Time For Goal Achievement: 03/09/20 Potential to Achieve Goals: Fair Progress towards PT goals: Progressing toward goals    Frequency    Min 3X/week      PT Plan Current plan remains appropriate    Co-evaluation PT/OT/SLP Co-Evaluation/Treatment: Yes Reason for Co-Treatment: Necessary to address cognition/behavior during functional activity PT goals  addressed during session: Mobility/safety with mobility OT goals addressed during session: ADL's and self-care      AM-PAC PT "6 Clicks" Mobility   Outcome Measure  Help needed turning from your back to your side while in a flat bed without using bedrails?: None Help needed moving from lying on your back to sitting on the side of a flat bed without using bedrails?: None Help needed moving to and from a bed to a chair (including a wheelchair)?: A Little Help needed standing up from a chair using your arms (e.g., wheelchair or bedside chair)?: A Little Help needed to walk in hospital room?: A Little Help needed climbing 3-5 steps with a railing? : Total 6 Click Score: 18    End of Session Equipment Utilized During Treatment: Gait belt Activity Tolerance: Patient tolerated treatment well Patient left: in chair;with call bell/phone within reach;with chair alarm set Nurse Communication: Mobility status PT Visit Diagnosis: History of falling (Z91.81);Muscle weakness (generalized) (M62.81);Difficulty in walking, not elsewhere classified (R26.2);Pain;Unsteadiness on feet (R26.81);Repeated falls (R29.6)     Time: 6967-8938 PT Time Calculation (min) (ACUTE ONLY): 35 min  Charges:  $Gait Training: 8-22 mins                     Mekel Haverstock B. Migdalia Dk PT, DPT Acute Rehabilitation Services Pager [phone] Office [phone]    [place] 02/25/2020, 12:21 PM

## 2020-02-25 NOTE — Plan of Care (Signed)
  Problem: Education: Goal: Knowledge of General Education information will improve Description: Including pain rating scale, medication(s)/side effects and non-pharmacologic comfort measures Outcome: Progressing   Problem: Health Behavior/Discharge Planning: Goal: Ability to manage health-related needs will improve Outcome: Progressing   Problem: Clinical Measurements: Goal: Ability to maintain clinical measurements within normal limits will improve Outcome: Progressing   Problem: Clinical Measurements: Goal: Cardiovascular complication will be avoided Outcome: Progressing   Problem: Activity: Goal: Risk for activity intolerance will decrease Outcome: Progressing   Problem: Nutrition: Goal: Adequate nutrition will be maintained Outcome: Progressing   Problem: Pain Managment: Goal: General experience of comfort will improve Outcome: Progressing   

## 2020-02-26 ENCOUNTER — Encounter (HOSPITAL_COMMUNITY): Payer: Self-pay | Admitting: Internal Medicine

## 2020-02-26 DIAGNOSIS — R55 Syncope and collapse: Secondary | ICD-10-CM | POA: Diagnosis not present

## 2020-02-26 DIAGNOSIS — I4819 Other persistent atrial fibrillation: Secondary | ICD-10-CM | POA: Diagnosis not present

## 2020-02-26 DIAGNOSIS — I5033 Acute on chronic diastolic (congestive) heart failure: Secondary | ICD-10-CM | POA: Diagnosis not present

## 2020-02-26 LAB — BASIC METABOLIC PANEL
Anion gap: 9 (ref 5–15)
BUN: 12 mg/dL (ref 8–23)
CO2: 24 mmol/L (ref 22–32)
Calcium: 8.8 mg/dL — ABNORMAL LOW (ref 8.9–10.3)
Chloride: 104 mmol/L (ref 98–111)
Creatinine, Ser: 1.06 mg/dL (ref 0.61–1.24)
GFR, Estimated: >60 mL/min (ref >60)
Glucose, Bld: 100 mg/dL — ABNORMAL HIGH (ref 70–99)
Potassium: 4.4 mmol/L (ref 3.5–5.1)
Sodium: 137 mmol/L (ref 135–145)

## 2020-02-26 LAB — CBC
HCT: 33.3 % — ABNORMAL LOW (ref 39.0–52.0)
Hemoglobin: 10.7 g/dL — ABNORMAL LOW (ref 13.0–17.0)
MCH: 31.5 pg (ref 26.0–34.0)
MCHC: 32.1 g/dL (ref 30.0–36.0)
MCV: 97.9 fL (ref 80.0–100.0)
Platelets: 210 10*3/uL (ref 150–400)
RBC: 3.4 MIL/uL — ABNORMAL LOW (ref 4.22–5.81)
RDW: 14.7 % (ref 11.5–15.5)
WBC: 11.8 10*3/uL — ABNORMAL HIGH (ref 4.0–10.5)
nRBC: 0 % (ref 0.0–0.2)

## 2020-02-26 LAB — GLUCOSE, CAPILLARY: Glucose-Capillary: 95 mg/dL (ref 70–99)

## 2020-02-26 LAB — MAGNESIUM: Magnesium: 1.3 mg/dL — ABNORMAL LOW (ref 1.7–2.4)

## 2020-02-26 MED ORDER — MAGNESIUM SULFATE 2 GM/50ML IV SOLN
2.0000 g | Freq: Once | INTRAVENOUS | Status: AC
  Administered 2020-02-26: 2 g via INTRAVENOUS
  Filled 2020-02-26: qty 50

## 2020-02-26 MED ORDER — FUROSEMIDE 40 MG PO TABS
40.0000 mg | ORAL_TABLET | Freq: Once | ORAL | Status: AC
  Administered 2020-02-26: 40 mg via ORAL
  Filled 2020-02-26: qty 1

## 2020-02-26 MED ORDER — METOPROLOL TARTRATE 5 MG/5ML IV SOLN
2.5000 mg | INTRAVENOUS | Status: DC | PRN
  Administered 2020-02-26 – 2020-03-03 (×4): 2.5 mg via INTRAVENOUS
  Filled 2020-02-26 (×3): qty 5

## 2020-02-26 MED ORDER — AMIODARONE LOAD VIA INFUSION
150.0000 mg | Freq: Once | INTRAVENOUS | Status: AC
  Administered 2020-02-26: 150 mg via INTRAVENOUS
  Filled 2020-02-26: qty 83.34

## 2020-02-26 MED ORDER — AMIODARONE HCL 200 MG PO TABS
400.0000 mg | ORAL_TABLET | Freq: Two times a day (BID) | ORAL | Status: DC
  Administered 2020-02-27 – 2020-03-05 (×15): 400 mg via ORAL
  Filled 2020-02-26 (×15): qty 2

## 2020-02-26 MED ORDER — AMIODARONE HCL IN DEXTROSE 360-4.14 MG/200ML-% IV SOLN
30.0000 mg/h | INTRAVENOUS | Status: AC
  Administered 2020-02-26 – 2020-02-27 (×3): 30 mg/h via INTRAVENOUS
  Filled 2020-02-26 (×4): qty 200

## 2020-02-26 MED ORDER — AMIODARONE HCL IN DEXTROSE 360-4.14 MG/200ML-% IV SOLN
60.0000 mg/h | INTRAVENOUS | Status: AC
  Administered 2020-02-26 (×2): 60 mg/h via INTRAVENOUS
  Filled 2020-02-26: qty 200

## 2020-02-26 NOTE — Progress Notes (Signed)
1400: patient finished bolus of amio, current heart rate 84 in NSR.

## 2020-02-26 NOTE — Progress Notes (Signed)
Inpatient Rehab Admissions Coordinator:   Note amio drip started for rate control, pt back in afib with RVR.  Will continue to follow for timing of possible rehab admit once HR controlled.   Shann Medal, PT, DPT Admissions Coordinator [phone] 02/26/20  1:15 PM

## 2020-02-26 NOTE — Progress Notes (Signed)
Progress Note  Patient Name: Darryl Diaz Date of Encounter: 02/26/2020  Primary Cardiologist: Sherren Mocha, MD   Subjective  Patient states that he feels well. No significant palpitations. States he enjoys working with PT/OT.  Flipped back into Afib with RVR. HR 140s currently. Inpatient Medications    Scheduled Meds: . amiodarone  150 mg Intravenous Once  . amiodarone  400 mg Oral BID  . apixaban  5 mg Oral BID  . atorvastatin  20 mg Oral Daily  . escitalopram  10 mg Oral Daily  . ferrous sulfate  325 mg Oral BID WC  . folic acid  1 mg Oral Daily  . furosemide  40 mg Oral Once  . loratadine  10 mg Oral Daily  . miconazole nitrate   Topical BID  . pantoprazole  40 mg Oral QAC breakfast  . potassium chloride  40 mEq Oral Once  . potassium chloride  40 mEq Oral BID  . sodium chloride flush  3 mL Intravenous Once  . vitamin B-1  250 mg Oral Daily   Continuous Infusions: . amiodarone     Followed by  . amiodarone    . magnesium sulfate bolus IVPB     PRN Meds: acetaminophen, ipratropium, metoprolol tartrate, nitroGLYCERIN, ondansetron (ZOFRAN) IV   Vital Signs    Vitals:   02/25/20 2350 02/26/20 0400 02/26/20 0600 02/26/20 0758  BP: 128/88 126/84 122/81 (!) 159/139  Pulse: 99 97 (!) 147 (!) 145  Resp: 16 18 20 19   Temp: 98.6 F (37 C) 97.9 F (36.6 C)  98.2 F (36.8 C)  TempSrc: Oral Oral  Oral  SpO2: 93% 99% 99%   Weight:  101.1 kg    Height:        Intake/Output Summary (Last 24 hours) at 02/26/2020 1050 Last data filed at 02/25/2020 1947 Gross per 24 hour  Intake 480 ml  Output 550 ml  Net -70 ml   Filed Weights   02/24/20 0916 02/25/20 0104 02/26/20 0400  Weight: 106.6 kg 101.6 kg 101.1 kg    Telemetry    Sinus tachycardia with HR 100-110s.  - Personally Reviewed  ECG    No new tracings - Personally Reviewed  Physical Exam   GEN: Comfortable Neck: No JVD, no carotid bruits Cardiac: Tachycardic, irregular, no  murmurs Respiratory: Clear to auscultation bilaterally, no wheezes/ rales/ rhonchi GI: Soft, ND, +BS MS: Warm, no significant edema Neuro:  Nonfocal, moving all extremities spontaneously Psych: Normal affect   Labs    Chemistry Recent Labs  Lab 02/24/20 0500 02/25/20 0903 02/26/20 0023  NA 138 136 137  K 3.0* 3.8 4.4  CL 100 104 104  CO2 25 24 24   GLUCOSE 128* 137* 100*  BUN 10 11 12   CREATININE 1.35* 0.99 1.06  CALCIUM 8.7* 8.7* 8.8*  GFRNONAA 50* >60 >60  ANIONGAP 13 8 9      Hematology Recent Labs  Lab 02/24/20 0500 02/25/20 0903 02/26/20 0023  WBC 7.9 11.3* 11.8*  RBC 3.52* 3.46* 3.40*  HGB 10.9* 10.8* 10.7*  HCT 33.4* 33.6* 33.3*  MCV 94.9 97.1 97.9  MCH 31.0 31.2 31.5  MCHC 32.6 32.1 32.1  RDW 15.0 14.7 14.7  PLT 229 216 210    Cardiac EnzymesNo results for input(s): TROPONINI in the last 168 hours. No results for input(s): TROPIPOC in the last 168 hours.   BNP Recent Labs  Lab 02/22/20 1433  BNP 266.2*     DDimer  Recent Labs  Lab 02/22/20  2051  DDIMER 0.38     Radiology    No results found.  Cardiac Studies  TTE 02/23/20 1. Left ventricular ejection fraction, by estimation, is 60 to 65%. The  left ventricle has normal function. The left ventricle has no regional  wall motion abnormalities. Left ventricular diastolic function could not  be evaluated.  2. Apical RV hyperkinesis. Right ventricular systolic function is normal.  The right ventricular size is mildly enlarged.  3. Left atrial size was mildly dilated.  4. The mitral valve is grossly normal. Trivial mitral valve  regurgitation.  5. The aortic valve was not well visualized. Aortic valve regurgitation  is not visualized.  6. There is dilatation of the aortic root, measuring 42 mm. There is  dilatation of the ascending aorta, measuring 39 mm.  7. The inferior vena cava is dilated in size with <50% respiratory  variability, suggesting right atrial pressure of 15 mmHg.    Echocardiogram 02/06/20: 1. Left ventricular ejection fraction, by estimation, is 60 to 65%. The  left ventricle has normal function. The left ventricle has no regional  wall motion abnormalities. Left ventricular diastolic parameters are  indeterminate.  2. There is mild hyperkinesis of the RV apex relative to the RV free  wall. Recommend clinical correlation if concern for underlying pulmonary  embolism.   . Right ventricular systolic function is low normal. The right  ventricular size is mildly enlarged. There is mildly elevated pulmonary  artery systolic pressure.  3. Left atrial size was mildly dilated.  4. The mitral valve is normal in structure. Suspect at least mild,  eccentric mitral regurgitation secondary to tethering of the posterior  mitral leaflet. mitral valve regurgitation.  5. The aortic valve is tricuspid. There is mild calcification of the  aortic valve. There is moderate thickening of the aortic valve. Aortic  valve regurgitation is not visualized. Mild to moderate aortic valve  sclerosis/calcification is present, without  any evidence of aortic stenosis.  6. Aortic dilatation noted. There is mild dilatation of the ascending  aorta, measuring 40 mm. There is mild dilatation of the aortic root,  measuring 39 mm.  7. The inferior vena cava is dilated in size with <50% respiratory  variability, suggesting right atrial pressure of 15 mmHg.   Patient Profile     79 y.o. 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  Assessment & Plan    1. Syncope: patient presented with sudden loss of consciousness while sitting in his car. No prodromal symptoms. EKG on arrival with afib with RVR but non-ischemic. Recent echo 01/2020 showed EF 60-65%,  indeterminate LV diastolic function, and RV findings suspicious for possible PE. DDimer negative this admission. No chest pain, SOB, or DOE complaints.  He did not start flecainide as recently recommended. Symptoms c/f cardiogenic syncope. Negative ischemic work-up. Concern for ? Unstable afib with RVR.  -TTE relatively unchanged from prior with LVEF 60-65%, RV mildly enlarged but with normal function -Myoview with no ischemia, normal EF -No VT on monitor; d-dimer negative  -S/p DCCV 10/18 with initial return to sinus rhythm now back in Afib -Working with PT/OT; likely needs CIR once HR stabilized  2. Persistent atrial fibrillation:   -S/p successful repeat DCCV on 10/17 however flipped back into Afib with RVR -Start amiodarone bolus and gtt -  Increase amiodarone PO to 400mg  BID; goal is to get him back in rhythm if possible -Will hold metop for now given increased dose of amio -Continue xarelto for [facility] -Will follow-up with Afib clinic for further management after discharge  3. Chronic diastolic CHF:  Stable. Some SOB on exam today. Likely due to afib with RVR -Trend I/Os and daily weights -Will give one dose PO lasix 40mg  today -Monitor I/Os and daily weights  4. HTN: BP stable -Holding BB for now given need to up-titrate amio  5. HLD: LDL 29 04/2019 - Continue atorvastatin  6. Groin rash: appears to have a mild yeast infection - Continue miconazole powder   7. Gait instability: RN notes unsteadiness when getting out of bed.  - Per PT, recommend CIR  - Appreciate PT/OT recommendations  - Likely CIR once HR better controlled   For questions or updates, please contact [facility] Please consult [email or web address] for contact info under Cardiology/STEMI.      Signed, Freada Bergeron, MD  02/26/2020, 10:50 AM   [phone]

## 2020-02-26 NOTE — Progress Notes (Signed)
   02/26/20 0600  Assess: MEWS Score  BP 122/81  Pulse Rate (!) 147  ECG Heart Rate (!) 147  Resp 20  Level of Consciousness Alert  SpO2 99 %  O2 Device Room Air  Patient Activity (if Appropriate) In bed  Assess: MEWS Score  MEWS Temp 0  MEWS Systolic 0  MEWS Pulse 3  MEWS RR 0  MEWS LOC 0  MEWS Score 3  MEWS Score Color Yellow  Assess: if the MEWS score is Yellow or Red  Were vital signs taken at a resting state? Yes  Focused Assessment No change from prior assessment  Early Detection of Sepsis Score *See Row Information* Low  MEWS guidelines implemented *See Row Information* Yes  Treat  MEWS Interventions Escalated (See documentation below)  Take Vital Signs  Increase Vital Sign Frequency  Yellow: Q 2hr X 2 then Q 4hr X 2, if remains yellow, continue Q 4hrs  Escalate  MEWS: Escalate Yellow: discuss with charge nurse/RN and consider discussing with provider and RRT  Notify: Charge Nurse/RN  Name of Charge Nurse/RN Notified Karlene RN  Date Charge Nurse/RN Notified 02/26/20  Time Charge Nurse/RN Notified 6015  Notify: Provider  Provider Name/Title Dr. Ahmed Prima  Date Provider Notified 02/26/20  Time Provider Notified [phone]  Notification Type Page  Notification Reason Change in status (Converted into a.fib)  Response See new orders  Date of Provider Response 02/26/20  Time of Provider Response 0617   Patient converted back into rapid a.fib at approx 0550. HR sustaining in 140-160s. EKG completed. Dr. Ahmed Prima paged. 2.5mg  metoprolol IV given. Will continue to monitor the patient.

## 2020-02-26 NOTE — Plan of Care (Signed)
  Problem: Education: Goal: Knowledge of General Education information will improve Description: Including pain rating scale, medication(s)/side effects and non-pharmacologic comfort measures Outcome: Progressing   Problem: Health Behavior/Discharge Planning: Goal: Ability to manage health-related needs will improve Outcome: Progressing   Problem: Clinical Measurements: Goal: Ability to maintain clinical measurements within normal limits will improve Outcome: Progressing   Problem: Clinical Measurements: Goal: Diagnostic test results will improve Outcome: Progressing   Problem: Clinical Measurements: Goal: Cardiovascular complication will be avoided Outcome: Progressing   Problem: Nutrition: Goal: Adequate nutrition will be maintained Outcome: Progressing   Problem: Pain Managment: Goal: General experience of comfort will improve Outcome: Progressing   Problem: Skin Integrity: Goal: Risk for impaired skin integrity will decrease Outcome: Progressing   

## 2020-02-27 ENCOUNTER — Other Ambulatory Visit: Payer: Medicare Other

## 2020-02-27 DIAGNOSIS — R55 Syncope and collapse: Secondary | ICD-10-CM | POA: Diagnosis not present

## 2020-02-27 LAB — CBC
HCT: 32.6 % — ABNORMAL LOW (ref 39.0–52.0)
Hemoglobin: 10.5 g/dL — ABNORMAL LOW (ref 13.0–17.0)
MCH: 31.5 pg (ref 26.0–34.0)
MCHC: 32.2 g/dL (ref 30.0–36.0)
MCV: 97.9 fL (ref 80.0–100.0)
Platelets: 195 10*3/uL (ref 150–400)
RBC: 3.33 MIL/uL — ABNORMAL LOW (ref 4.22–5.81)
RDW: 14.8 % (ref 11.5–15.5)
WBC: 12.5 10*3/uL — ABNORMAL HIGH (ref 4.0–10.5)
nRBC: 0 % (ref 0.0–0.2)

## 2020-02-27 LAB — BASIC METABOLIC PANEL
Anion gap: 7 (ref 5–15)
BUN: 19 mg/dL (ref 8–23)
CO2: 22 mmol/L (ref 22–32)
Calcium: 8.9 mg/dL (ref 8.9–10.3)
Chloride: 104 mmol/L (ref 98–111)
Creatinine, Ser: 1.5 mg/dL — ABNORMAL HIGH (ref 0.61–1.24)
GFR, Estimated: 44 mL/min — ABNORMAL LOW (ref >60)
Glucose, Bld: 118 mg/dL — ABNORMAL HIGH (ref 70–99)
Potassium: 5.2 mmol/L — ABNORMAL HIGH (ref 3.5–5.1)
Sodium: 133 mmol/L — ABNORMAL LOW (ref 135–145)

## 2020-02-27 LAB — MAGNESIUM: Magnesium: 1.7 mg/dL (ref 1.7–2.4)

## 2020-02-27 NOTE — Progress Notes (Signed)
Progress Note  Patient Name: Darryl Diaz Date of Encounter: 02/27/2020  Primary Cardiologist: Sherren Mocha, MD   Subjective  Patient feels better this morning. Now back in sinus rhythm. Hoping to work with PT today. Asking to go home soon  HR 90s. Net positive 921. Cr 1.5 today Inpatient Medications    Scheduled Meds: . amiodarone  400 mg Oral BID  . apixaban  5 mg Oral BID  . atorvastatin  20 mg Oral Daily  . escitalopram  10 mg Oral Daily  . ferrous sulfate  325 mg Oral BID WC  . folic acid  1 mg Oral Daily  . loratadine  10 mg Oral Daily  . miconazole nitrate   Topical BID  . pantoprazole  40 mg Oral QAC breakfast  . potassium chloride  40 mEq Oral Once  . potassium chloride  40 mEq Oral BID  . sodium chloride flush  3 mL Intravenous Once  . vitamin B-1  250 mg Oral Daily   Continuous Infusions: . amiodarone 30 mg/hr (02/26/20 2134)   PRN Meds: acetaminophen, ipratropium, metoprolol tartrate, nitroGLYCERIN, ondansetron (ZOFRAN) IV   Vital Signs    Vitals:   02/26/20 1944 02/26/20 2015 02/26/20 2352 02/27/20 0400  BP: (!) 84/62 100/66 111/77 128/85  Pulse: 81 80 81   Resp: 20 20 18 20   Temp: 98.1 F (36.7 C) 98.1 F (36.7 C) 98.3 F (36.8 C) 98 F (36.7 C)  TempSrc: Oral Oral Oral Oral  SpO2: 98% 98% 97% 97%  Weight:    103.2 kg  Height:        Intake/Output Summary (Last 24 hours) at 02/27/2020 0708 Last data filed at 02/27/2020 0400 Gross per 24 hour  Intake 1220.99 ml  Output 300 ml  Net 920.99 ml   Filed Weights   02/25/20 0104 02/26/20 0400 02/27/20 0400  Weight: 101.6 kg 101.1 kg 103.2 kg    Telemetry     Sinus rhythm currently in 90s; 6 beat run NSVT- Personally Reviewed  ECG    Afib with HR 147 on 10/20 - Personally Reviewed  Physical Exam   GEN: Laying in bed, comfortable Neck: No JVD, no carotid bruits Cardiac: RR, no murmurs Respiratory: Clear to auscultation bilaterally, no wheezes/ rales/ rhonchi GI: Soft, ND,  +BS MS: Warm, no significant edema Neuro:  Nonfocal, moving all extremities spontaneously Psych: Normal affect   Labs    Chemistry Recent Labs  Lab 02/25/20 0903 02/26/20 0023 02/27/20 0020  NA 136 137 133*  K 3.8 4.4 5.2*  CL 104 104 104  CO2 24 24 22   GLUCOSE 137* 100* 118*  BUN 11 12 19   CREATININE 0.99 1.06 1.50*  CALCIUM 8.7* 8.8* 8.9  GFRNONAA >60 >60 44*  ANIONGAP 8 9 7      Hematology Recent Labs  Lab 02/25/20 0903 02/26/20 0023 02/27/20 0020  WBC 11.3* 11.8* 12.5*  RBC 3.46* 3.40* 3.33*  HGB 10.8* 10.7* 10.5*  HCT 33.6* 33.3* 32.6*  MCV 97.1 97.9 97.9  MCH 31.2 31.5 31.5  MCHC 32.1 32.1 32.2  RDW 14.7 14.7 14.8  PLT 216 210 195    Cardiac EnzymesNo results for input(s): TROPONINI in the last 168 hours. No results for input(s): TROPIPOC in the last 168 hours.   BNP Recent Labs  Lab 02/22/20 1433  BNP 266.2*     DDimer  Recent Labs  Lab 02/22/20 2051  DDIMER 0.38     Radiology    No results found.  Cardiac  Studies  TTE 02/23/20 1. Left ventricular ejection fraction, by estimation, is 60 to 65%. The  left ventricle has normal function. The left ventricle has no regional  wall motion abnormalities. Left ventricular diastolic function could not  be evaluated.  2. Apical RV hyperkinesis. Right ventricular systolic function is normal.  The right ventricular size is mildly enlarged.  3. Left atrial size was mildly dilated.  4. The mitral valve is grossly normal. Trivial mitral valve  regurgitation.  5. The aortic valve was not well visualized. Aortic valve regurgitation  is not visualized.  6. There is dilatation of the aortic root, measuring 42 mm. There is  dilatation of the ascending aorta, measuring 39 mm.  7. The inferior vena cava is dilated in size with <50% respiratory  variability, suggesting right atrial pressure of 15 mmHg.   Echocardiogram 02/06/20: 1. Left ventricular ejection fraction, by estimation, is 60 to 65%.  The  left ventricle has normal function. The left ventricle has no regional  wall motion abnormalities. Left ventricular diastolic parameters are  indeterminate.  2. There is mild hyperkinesis of the RV apex relative to the RV free  wall. Recommend clinical correlation if concern for underlying pulmonary  embolism.   . Right ventricular systolic function is low normal. The right  ventricular size is mildly enlarged. There is mildly elevated pulmonary  artery systolic pressure.  3. Left atrial size was mildly dilated.  4. The mitral valve is normal in structure. Suspect at least mild,  eccentric mitral regurgitation secondary to tethering of the posterior  mitral leaflet. mitral valve regurgitation.  5. The aortic valve is tricuspid. There is mild calcification of the  aortic valve. There is moderate thickening of the aortic valve. Aortic  valve regurgitation is not visualized. Mild to moderate aortic valve  sclerosis/calcification is present, without  any evidence of aortic stenosis.  6. Aortic dilatation noted. There is mild dilatation of the ascending  aorta, measuring 40 mm. There is mild dilatation of the aortic root,  measuring 39 mm.  7. The inferior vena cava is dilated in size with <50% respiratory  variability, suggesting right atrial pressure of 15 mmHg.   Patient Profile     79 y.o. 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  Assessment & Plan    1. Syncope: patient presented with sudden loss of consciousness while sitting in his car. No prodromal symptoms. EKG on arrival with afib with RVR but non-ischemic. Recent echo 01/2020 showed EF 60-65%, indeterminate LV diastolic function, and RV findings suspicious for possible PE. DDimer negative  this admission. No chest pain, SOB, or DOE complaints.  He did not start flecainide as recently recommended. Symptoms c/f cardiogenic syncope. Negative ischemic work-up. Concern for ? Unstable afib with RVR.  -TTE relatively unchanged from prior with LVEF 60-65%, RV mildly enlarged but with normal function -Myoview with no ischemia, normal EF -Had one episode NSVT but no VT on monitor -D-dimer negative  -S/p DCCV 10/18 with initial return to sinus rhythm with recurrent Afib on 10/20 now back in sinus rhythm after amiodarone bolus and gtt -Working with PT/OT; likely needs CIR as unsteady on his feet and weak  2. Persistent atrial fibrillation:   -S/p successful repeat DCCV on 10/17 however flipped back into Afib with RVR on 10/20.  Now back in sinus -Continue amiodarone gtt per protocol -Continue amiodarone 400mg  BID -Continue xarelto for [facility] -Will follow-up with Afib clinic for further management after discharge  3. Chronic diastolic CHF:  Stable. Euvolemic -Trend I/Os and daily weights -Hold lasix today as Cr rising -Monitor I/Os and daily weights  4. HTN: BP stable -Well controlled  5. HLD: LDL 29 04/2019 - Continue atorvastatin  6. Gait instability: RN notes unsteadiness when getting out of bed.  - Per PT, recommend CIR  - Appreciate PT/OT recommendations  - Likely CIR due to gait instability   For questions or updates, please contact [facility] Please consult [email or web address] for contact info under Cardiology/STEMI.      Signed, Freada Bergeron, MD  02/27/2020, 7:08 AM   [phone]

## 2020-02-27 NOTE — Plan of Care (Signed)
  Problem: Education: Goal: Knowledge of General Education information will improve Description: Including pain rating scale, medication(s)/side effects and non-pharmacologic comfort measures Outcome: Progressing   Problem: Health Behavior/Discharge Planning: Goal: Ability to manage health-related needs will improve Outcome: Progressing   Problem: Clinical Measurements: Goal: Ability to maintain clinical measurements within normal limits will improve Outcome: Progressing   Problem: Clinical Measurements: Goal: Diagnostic test results will improve Outcome: Progressing   Problem: Clinical Measurements: Goal: Cardiovascular complication will be avoided Outcome: Progressing   Problem: Activity: Goal: Risk for activity intolerance will decrease Outcome: Progressing   Problem: Nutrition: Goal: Adequate nutrition will be maintained Outcome: Progressing   Problem: Pain Managment: Goal: General experience of comfort will improve Outcome: Progressing   Problem: Skin Integrity: Goal: Risk for impaired skin integrity will decrease Outcome: Progressing

## 2020-02-27 NOTE — Progress Notes (Signed)
Inpatient Rehab Admissions Coordinator:   Met with patient at the bedside while he participated in physical therapy.  Note pt very impulsive and with deficits in safety awareness, which certainly make him a significant fall risk at home.  We discussed that pt feels "bored" on acute, and that is why he wants to go home; but he also states that he would be agreeable to transition to CIR (if medically stable and bed available) if therapy still recommends.    I spoke to pt's son over the phone to discuss what support is available to patient following a rehab stay, should the recommendation be for 24/7 supervision, which in my opinion, is likely.  Son states that he and his sister both live out of state and are unable to physically provide for patient, but that he has a good community of friends, church members, and his brother/sister in this area.  Son also mentions that pt has the resources to hire private caregivers, if needed to facilitate a rehab stay.  With this information, feel that patient would benefit from a CIR stay to maximize his independence prior to returning home.    Will continue to follow for possible admit pending medical stability and bed availability, hopefully in the next few days.   Shann Medal, PT, DPT Admissions Coordinator [phone] 02/27/20  3:53 PM

## 2020-02-27 NOTE — Progress Notes (Signed)
Occupational Therapy Treatment Patient Details Name: Darryl Diaz MRN: [id number] DOB: Nov 18, 1940 Today's Date: 02/27/2020    History of present illness 79yo male presenting with history of syncope and A-fib with RVR. Note recent hospital admission 9/29-10/1 also for A-fib with RVR during which he received cardioversion. He now returns after syncopal episode which occurred in his car prior to leaving home. Received an additional cardioversion 10/18. 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 He does not want to remain in the hospital over the weekend. Pt continues to demonstrate impulsivity with impaired standing balance and tremulousness. Pt was able to don his shoes with set up, toilet and groom at sink with min assist. He continues to be a high fall risk. Educated pt in how he may minimize falls at home. Pt receptive.  Follow Up Recommendations  CIR    Equipment Recommendations  None recommended by OT    Recommendations for Other Services      Precautions / Restrictions Precautions Precautions: Fall Precaution Comments: history of falls, impulsive, some shaking with intentional movements       Mobility Bed Mobility Overal bed mobility: Needs Assistance Bed Mobility: Supine to Sit     Supine to sit: Supervision     General bed mobility comments: assist for line managent, linens wrapped around his leg, but pt unaware  Transfers   Equipment used: Rolling walker (2 wheeled) Transfers: Sit to/from Stand Sit to Stand: Min assist         General transfer comment: use of momentum, assist to rise and steady from bed and toilet    Balance Overall balance assessment: Needs assistance   Sitting balance-Leahy Scale: Good Sitting balance - Comments: no LOB  reaching feet at EOB   Standing balance support: Bilateral upper extremity supported;During functional activity Standing balance-Leahy Scale: Poor Standing balance comment: reliant on UEs and min assist                           ADL either performed or assessed with clinical judgement   ADL Overall ADL's : Needs assistance/impaired Eating/Feeding: Independent;Sitting   Grooming: Wash/dry hands;Standing;Min guard;Brushing hair               Lower Body Dressing: Set up;Sitting/lateral leans Lower Body Dressing Details (indicate cue type and reason): donned slip on shoes Toilet Transfer: Minimal assistance;Ambulation;RW;Regular Toilet;Grab bars   Toileting- Clothing Manipulation and Hygiene: Minimal assistance;Sit to/from stand       Functional mobility during ADLs: Minimal assistance;Rolling walker General ADL Comments: pt readily willing to work with OT, has been going to the bathroom with assist of nursing staff today     Vision       Perception     Praxis      Cognition Arousal/Alertness: Awake/alert Behavior During Therapy: Impulsive;Anxious Overall Cognitive Status: Impaired/Different from baseline Area of Impairment: Safety/judgement;Attention                   Current Attention Level: Selective     Safety/Judgement: Decreased awareness of safety;Decreased awareness of deficits     General Comments: unclear of pt's baseline        Exercises     Shoulder Instructions  General Comments      Pertinent Vitals/ Pain       Pain Assessment: No/denies pain  Home Living                                          Prior Functioning/Environment              Frequency  Min 2X/week        Progress Toward Goals  OT Goals(current goals can now be found in the care plan section)  Progress towards OT goals: Progressing toward goals  Acute Rehab OT Goals Patient Stated Goal: get stronger and more  independent and safer - go to Rehab OT Goal Formulation: With patient Time For Goal Achievement: 03/10/20 Potential to Achieve Goals: Good  Plan Discharge plan remains appropriate    Co-evaluation                 AM-PAC OT "6 Clicks" Daily Activity     Outcome Measure   Help from another person eating meals?: None Help from another person taking care of personal grooming?: A Little Help from another person toileting, which includes using toliet, bedpan, or urinal?: A Little Help from another person bathing (including washing, rinsing, drying)?: A Little Help from another person to put on and taking off regular upper body clothing?: A Little Help from another person to put on and taking off regular lower body clothing?: A Little 6 Click Score: 19    End of Session    OT Visit Diagnosis: Unsteadiness on feet (R26.81);Other abnormalities of gait and mobility (R26.89);Muscle weakness (generalized) (M62.81);History of falling (Z91.81);Other symptoms and signs involving cognitive function   Activity Tolerance Patient tolerated treatment well   Patient Left in chair;with call bell/phone within reach;with chair alarm set   Nurse Communication Mobility status        Time: 0626-9485 OT Time Calculation (min): 17 min  Charges: OT General Charges $OT Visit: 1 Visit OT Treatments $Self Care/Home Management : 8-22 mins  Nestor Lewandowsky, OTR/L Acute Rehabilitation Services Pager: [phone] Office: [phone] [place] 02/27/2020, 12:40 PM

## 2020-02-27 NOTE — Progress Notes (Signed)
Physical Therapy Treatment Patient Details Name: Darryl Diaz MRN: [id number] DOB: May 22, 1940 Today's Date: 02/27/2020    History of Present Illness 79yo male presenting with history of syncope and A-fib with RVR. Note recent hospital admission 9/29-10/1 also for A-fib with RVR during which he received cardioversion. He now returns after syncopal episode which occurred in his car prior to leaving home. Received an additional cardioversion 10/18. 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 Despite his readiness, pt continues to exhibit decreased safety awareness and knowledge of his deficits. With mobility pt is susceptible to posterior LoB requiring modA for steadying and increases his gait speed as he approaches his destination further increasing his instability. PT continues to recommend CIR to improve strength and safety so that he can return home with support and eventually go to ALF. PT will continue to follow acutely.       Follow Up Recommendations  CIR;Supervision for mobility/OOB     Equipment Recommendations  Other (comment) (TBD at next venue)       Precautions / Restrictions Precautions Precautions: Fall Precaution Comments: history of falls, impulsive, some shaking with intentional movements Restrictions Weight Bearing Restrictions: No    Mobility  Bed Mobility Overal bed mobility: Needs Assistance Bed Mobility: Supine to Sit     Supine to sit: Min guard Sit to supine: Min assist   General bed mobility comments: min guard for safety with lines, min A for bringing LE back into bed  Transfers Overall transfer level: Needs assistance Equipment used: Rolling walker (2 wheeled) Transfers: Sit to/from Stand Sit to  Stand: Min assist         General transfer comment: multiple attempts prior to actually coming to upright and once in upright requires support posteriorly from the bed to steady  Ambulation/Gait Ambulation/Gait assistance: Min assist;Mod assist Gait Distance (Feet): 50 Feet Assistive device: Rolling walker (2 wheeled);4-wheeled walker Gait Pattern/deviations: Step-through pattern;Decreased step length - right;Decreased step length - left;Shuffle Gait velocity: slowed Gait velocity interpretation: <1.31 ft/sec, indicative of household ambulator General Gait Details: min A for steadying with 2x LOB posteriorly requiring modA for steadying, pt very goal oriented trying to make it to point in the hallway despite increasing instability, and ambulates to fast for abilities especially as he nears bed to sit down       Balance Overall balance assessment: Needs assistance Sitting-balance support: Feet supported;Bilateral upper extremity supported Sitting balance-Leahy Scale: Fair     Standing balance support: Bilateral upper extremity supported;During functional activity Standing balance-Leahy Scale: Poor Standing balance comment: heavy reliance on BUE support                            Cognition Arousal/Alertness: Awake/alert Behavior During Therapy: Anxious;Impulsive Overall Cognitive Status: Impaired/Different from baseline Area of Impairment: Attention;Memory;Following commands;Safety/judgement;Awareness;Problem solving                   Current Attention Level: Sustained;Selective Memory: Decreased short-term memory Following Commands: Follows multi-step commands inconsistently Safety/Judgement: Decreased awareness of safety;Decreased awareness of deficits   Problem Solving: Difficulty sequencing;Requires verbal cues General Comments: impatient about wanting to go home despite obvious deficits  General Comments General comments (skin integrity,  edema, etc.): max noted HR 121 bpm with ambulation      Pertinent Vitals/Pain Pain Assessment: No/denies pain Faces Pain Scale: No hurt           PT Goals (current goals can now be found in the care plan section) Acute Rehab PT Goals Patient Stated Goal: get stronger and more independent and safer - go to Rehab PT Goal Formulation: With patient Time For Goal Achievement: 03/09/20 Potential to Achieve Goals: Fair Progress towards PT goals: Progressing toward goals    Frequency    Min 3X/week      PT Plan Current plan remains appropriate       AM-PAC PT "6 Clicks" Mobility   Outcome Measure  Help needed turning from your back to your side while in a flat bed without using bedrails?: None Help needed moving from lying on your back to sitting on the side of a flat bed without using bedrails?: None Help needed moving to and from a bed to a chair (including a wheelchair)?: A Little Help needed standing up from a chair using your arms (e.g., wheelchair or bedside chair)?: A Little Help needed to walk in hospital room?: A Little Help needed climbing 3-5 steps with a railing? : Total 6 Click Score: 18    End of Session Equipment Utilized During Treatment: Gait belt Activity Tolerance: Patient tolerated treatment well Patient left: in bed;with bed alarm set;with call bell/phone within reach Nurse Communication: Mobility status PT Visit Diagnosis: History of falling (Z91.81);Muscle weakness (generalized) (M62.81);Difficulty in walking, not elsewhere classified (R26.2);Pain;Unsteadiness on feet (R26.81);Repeated falls (R29.6)     Time: 0867-6195 PT Time Calculation (min) (ACUTE ONLY): 28 min  Charges:  $Gait Training: 8-22 mins $Therapeutic Activity: 8-22 mins                     Barbra Miner B. Migdalia Dk PT, DPT Acute Rehabilitation Services Pager [phone] Office [phone]    [place] 02/27/2020, 4:42 PM

## 2020-02-27 NOTE — Plan of Care (Signed)

## 2020-02-28 DIAGNOSIS — R55 Syncope and collapse: Secondary | ICD-10-CM | POA: Diagnosis not present

## 2020-02-28 NOTE — PMR Pre-admission (Signed)
PMR Admission Coordinator Pre-Admission Assessment  Patient: Darryl Diaz is an 79 y.o., male MRN: [id number] DOB: May 31, 1940 Height: 6' (182.9 cm) Weight: 101.3 kg  Insurance Information HMO:     PPO:      PCP:      IPA:      80/20:      OTHER:  PRIMARY: Medicare A and B      Policy#: [id number]      Subscriber: pt CM Name:       Phone#:      Fax#:  Pre-Cert#: verified Civil engineer, contracting:  Benefits:  Phone #:      Name:  Eff. Date: 08/08/06 A and B     Deduct: $1484      Out of Pocket Max: n/a      Life Max: n/a CIR: 100%      SNF: 20 full days Outpatient: 80%     Co-Pay: 20% Home Health: 100%      Co-Pay:  DME: 80%     Co-Pay: 20% Providers:  SECONDARYLynda Rainwater      Policy#: [id number]     Phone#: [phone]  Financial Counselor:       Phone#:   The "Data Collection Information Summary" for patients in Inpatient Rehabilitation Facilities with attached "Privacy Act [place] Records" was provided and verbally reviewed with: Patient  Emergency Contact Information Contact Information    Name Relation Home Work Mobile   Hester Son [phone]  [phone]   [name],[name] Sister [phone]  [phone]   Inocencio, Roy Daughter [phone]        Current Medical History  Patient Admitting Diagnosis: debility 2/2 recurrent AF with RVR, syncope  History of Present Illness: Darryl Diaz is a 79 year old right-handed male with history of hypertension, hyperlipidemia, depression, tobacco abuse, ETOH, atrial fibrillation maintained on Eliquis and question medical compliance, bradycardia arrest 04/2019, GI bleed secondary to Mallory-Weiss tear, OSA not on CPAP.  Patient with recent admission 02/05/2020 to 02/07/2020 for A. fib with RVR, during which he received cardioversion, with hospital course complicated by electrolyte imbalance and pressure wounds, followed by wound care nurse, and he was discharged to a skilled nursing facility before returning home.   Patient did see his cardiologist 02/18/2020 for follow-up and again in atrial fibrillation with recommendations to begin flecainide and plan for DCCV 02/24/2020. Presented 02/22/2020 for bout of syncope in his car before going to a pre-procedure covid test.  On arrival to the ED heart rate elevated 110s-130, potassium 3.5, magnesium 1.9, creatinine 1.1, hemoglobin 11.2, platelets 295,000, BNP 266, EKG atrial fibrillation with rate 145.  Cardiology service follow-up is Toprol was discontinued.  A follow-up echocardiogram ejection fraction of 60 to 65% no wall motion abnormalities.  Patient again with episodes in and out of atrial fibrillation placed on amiodarone and adjusted, he remains on Eliquis.  He is tolerating a regular diet.  Therapies have been ongoing with slow gains.  Patient was recommended for CIR.    Patient's medical record from [facility] has been reviewed by the rehabilitation admission coordinator and physician.  Past Medical History  Past Medical History:  Diagnosis Date  . 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 Anemia   . Bradycardia    low 20's  . Cardiac arrest ([place]) 04/20/2019   12 min CPR with epi  . Dysrhythmia   . Fall at home 03/28/2016  . Fatty liver   . GERD (gastroesophageal  reflux disease)   . H/O seasonal allergies   . History of GI bleed   . Hx of adenomatous colonic polyps 08/10/2017  . Hyperlipidemia   . Hypertension   . Insomnia   . 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 MI (myocardial infarction) ([place]) 04/2019   had CPR by EMS  . OA (osteoarthritis)    hands  . OSA (obstructive sleep apnea)    No longer uses CPAP  . Persistent atrial fibrillation with rapid ventricular response ([place]) 02/05/2020    Family History   family history includes Arthritis in his mother; Heart disease (age of onset: 43) in his mother; Heart failure in his sister; Heart failure (age of onset: 91) in his father; Hypertension in  his mother; Stroke in his maternal aunt.  Prior Rehab/Hospitalizations Has the patient had prior rehab or hospitalizations prior to admission? Yes  Has the patient had major surgery during 100 days prior to admission? Yes   Current Medications  Current Facility-Administered Medications:  .  acetaminophen (TYLENOL) tablet 650 mg, 650 mg, Oral, Q4H PRN, Chandrasekhar, Mahesh A, MD, 650 mg at 02/23/20 0340 .  amiodarone (PACERONE) tablet 400 mg, 400 mg, Oral, BID, Freada Bergeron, MD, 400 mg at 03/05/20 0841 .  apixaban (ELIQUIS) tablet 5 mg, 5 mg, Oral, BID, Chandrasekhar, Mahesh A, MD, 5 mg at 03/05/20 0842 .  atorvastatin (LIPITOR) tablet 20 mg, 20 mg, Oral, Daily, Chandrasekhar, Mahesh A, MD, 20 mg at 03/05/20 0842 .  escitalopram (LEXAPRO) tablet 10 mg, 10 mg, Oral, Daily, Chandrasekhar, Mahesh A, MD, 10 mg at 03/05/20 0842 .  ferrous sulfate tablet 325 mg, 325 mg, Oral, BID WC, Chandrasekhar, Mahesh A, MD, 325 mg at 03/05/20 0842 .  folic acid (FOLVITE) tablet 1 mg, 1 mg, Oral, Daily, Chandrasekhar, Mahesh A, MD, 1 mg at 03/05/20 0841 .  ipratropium (ATROVENT) nebulizer solution 0.5 mg, 0.5 mg, Nebulization, Q6H PRN, Freada Bergeron, MD, 0.5 mg at 02/25/20 1751 .  loratadine (CLARITIN) tablet 10 mg, 10 mg, Oral, Daily, Chandrasekhar, Mahesh A, MD, 10 mg at 03/05/20 0841 .  metoprolol tartrate (LOPRESSOR) injection 2.5 mg, 2.5 mg, Intravenous, Q2H PRN, Barrett, Rhonda G, PA-C, 2.5 mg at 03/03/20 0709 .  miconazole nitrate (MICATIN) topical powder, , Topical, BID, Chandrasekhar, Mahesh A, MD, Given at 03/05/20 0846 .  nitroGLYCERIN (NITROSTAT) SL tablet 0.4 mg, 0.4 mg, Sublingual, Q5 Min x 3 PRN, Chandrasekhar, Mahesh A, MD .  ondansetron (ZOFRAN) injection 4 mg, 4 mg, Intravenous, Q6H PRN, Chandrasekhar, Mahesh A, MD .  pantoprazole (PROTONIX) EC tablet 40 mg, 40 mg, Oral, QAC breakfast, Chandrasekhar, Mahesh A, MD, 40 mg at 03/05/20 0842 .  sodium chloride flush (NS) 0.9 %  injection 3 mL, 3 mL, Intravenous, Once, Chandrasekhar, Mahesh A, MD .  thiamine tablet 250 mg, 250 mg, Oral, Daily, Chandrasekhar, Mahesh A, MD, 250 mg at 03/05/20 1165  Facility-Administered Medications Ordered in Other Encounters:  .  metoprolol tartrate (LOPRESSOR) injection, , , Anesthesia Intra-op, Mariea Clonts, CRNA, 5 mg at 02/27/19 1318  Patients Current Diet:  Diet Order            Diet Heart Room service appropriate? Yes; Fluid consistency: Thin  Diet effective now                 Precautions / Restrictions Precautions Precautions: Fall Precaution Comments: history of falls, impulsive, some shaking with intentional movements Restrictions Weight Bearing Restrictions: No   Has the patient  had 2 or more falls or a fall with injury in the past year? Yes  Prior Activity Level Limited Community (1-2x/wk): recent admission to SNF (discharged home "mod I" in early October); was still driving; no DME used in particular but has RW/rollator/w/c etc available  Prior Functional Level Self Care: Did the patient need help bathing, dressing, using the toilet or eating? Independent  Indoor Mobility: Did the patient need assistance with walking from room to room (with or without device)? Independent   Stairs: Did the patient need assistance with internal or external stairs (with or without device)? Independent  Functional Cognition: Did the patient need help planning regular tasks such as shopping or remembering to take medications? Independent  Home Assistive Devices / [place] Devices/Equipment: Environmental consultant (specify type), Wheelchair, Radio producer (specify quad or straight) (Rolator) Home Equipment: Environmental consultant - 2 wheels, Environmental consultant - 4 wheels, Bedside commode, Wheelchair - manual, Shower seat - built in, [employer] held shower head, Adaptive equipment  Prior Device Use: Indicate devices/aids used by the patient prior to current illness, exacerbation or injury? None of the above and  probably should have been, per his assessment.   Current Functional Level Cognition  Overall Cognitive Status: Impaired/Different from baseline Current Attention Level: Selective, Alternating Orientation Level: Oriented X4 Following Commands: Follows multi-step commands inconsistently Safety/Judgement: Decreased awareness of safety, Decreased awareness of deficits General Comments: PT has seen pt at least 3 times and does not remember, continues to be confused about why he has not gone home despite education that he has not been medically ready    Extremity Assessment (includes Sensation/Coordination)  Upper Extremity Assessment: Generalized weakness  Lower Extremity Assessment: Defer to PT evaluation    ADLs  Overall ADL's : Needs assistance/impaired Eating/Feeding: Independent, Sitting Grooming: Wash/dry hands, Standing, Min guard, Brushing hair Grooming Details (indicate cue type and reason): EOB Upper Body Bathing: Minimal assistance, Sitting Lower Body Bathing: Sitting/lateral leans, Sit to/from stand, Moderate assistance Upper Body Dressing : Minimal assistance, Sitting Lower Body Dressing: Set up, Sitting/lateral leans Lower Body Dressing Details (indicate cue type and reason): donned slip on shoes Toilet Transfer: Minimal assistance, Ambulation, RW, Regular Toilet, Grab bars Toilet Transfer Details (indicate cue type and reason): attempted to stand from EOB x4 before success with min A, unsafe hand placement and decreased safety awareness.  Toileting- Water quality scientist and Hygiene: Min guard, Sit to/from stand Toileting - Water quality scientist Details (indicate cue type and reason): able to complete peri-care independently Functional mobility during ADLs: Minimal assistance, Rolling walker General ADL Comments: pt motivated to go to the bathroom and ambulate to date, continues to make steady progress    Mobility  Overal bed mobility: Needs Assistance Bed Mobility:  Supine to Sit Supine to sit: Min guard Sit to supine: Min assist General bed mobility comments: increased effort to come to EoB    Transfers  Overall transfer level: Needs assistance Equipment used: Rolling walker (2 wheeled) Transfers: Sit to/from Stand Sit to Stand: Min guard Stand pivot transfers: Max assist General transfer comment: min guard for safety, pt with good power up and steadying, however needs increased cuing for reaching back to chair and controlling his descent     Ambulation / Gait / Stairs / Emergency planning/management officer  Ambulation/Gait Ambulation/Gait assistance: Min assist, Mod assist Gait Distance (Feet): 50 Feet (requires 2x standing rest breaks due to DoE) Assistive device: Rolling walker (2 wheeled) Gait Pattern/deviations: Step-through pattern, Decreased step length - right, Decreased step length - left, Shuffle General Gait Details:  min A for steadying with 2x LoB posteriorly equiring Mod A for steadying, pt with poor pleth wave form with ambulation and despite 4/4 DoE when pleth is good SaO2 >90% Gait velocity: slowed Gait velocity interpretation: <1.8 ft/sec, indicate of risk for recurrent falls    Posture / Balance Dynamic Sitting Balance Sitting balance - Comments: no LOB reaching feet at EOB Balance Overall balance assessment: Needs assistance Sitting-balance support: Feet supported, Bilateral upper extremity supported Sitting balance-Leahy Scale: Fair Sitting balance - Comments: no LOB reaching feet at EOB Standing balance support: Bilateral upper extremity supported, During functional activity Standing balance-Leahy Scale: Poor Standing balance comment: heavy reliance on BUE support    Special needs/care consideration Oxygen occasionally up to 2L in hospital   Previous Home Environment (from acute therapy documentation) Living Arrangements: Alone Available Help at Discharge: Family, Available PRN/intermittently, Neighbor (sister in [place] and she can  check on him) Type of Home: Other(Comment) (townhome) Home Layout: Two level, Able to live on main level with bedroom/bathroom, Full bath on main level Alternate Level Stairs-Number of Steps: does not go upstairs Home Access: Level entry Bathroom Shower/Tub: Multimedia programmer: Handicapped height Bathroom Accessibility: Yes How Accessible: Accessible via walker [place]: No Additional Comments: pt drives and does his own grocery shopping. patient reports prior to COVID he was swimming 2-3x/week [facility] and would like to get back to that.  Discharge Living Setting Plans for Discharge Living Setting: Patient's home Type of Home at Discharge: Apartment (townhome) Discharge Home Layout: Two level, Able to live on main level with bedroom/bathroom (apartment upstairs) Discharge Home Access: Level entry Discharge Bathroom Shower/Tub: Walk-in shower Discharge Bathroom Toilet: Standard Discharge Bathroom Accessibility: Yes How Accessible: Accessible via walker Does the patient have any problems obtaining your medications?: No  Social/Family/Support Systems Anticipated Caregiver: will have to hire caregivers, pt's children live out of town, and near by friends/family cannot provide 24/7 support Anticipated Caregiver's Contact Information: son Rykker Coviello) [phone] Ability/Limitations of Caregiver: lives out of state Caregiver Availability: Other (Comment) (will need to hire caregivers if needed 24/7) Discharge Plan Discussed with Primary Caregiver: Yes Is Caregiver In Agreement with Plan?: Yes Does Caregiver/Family have Issues with Lodging/Transportation while Pt is in Rehab?: No  Goals Patient/Family Goal for Rehab: PT/OT supervision to mod I, SLP n/a Expected length of stay: 7-10 days Cultural Considerations: n/a Additional Information: Pt motivated to return home, although will likely have 24/7 supervision recommendations from CIR.  Per son, pt has resources  for this.  He is also on the waiting list for 2 multi-level care facilities Pt/Family Agrees to Admission and willing to participate: Yes Program Orientation Provided & Reviewed with Pt/Caregiver Including Roles  & Responsibilities: Yes  Barriers to Discharge: Lack of/limited family support  Decrease burden of Care through IP rehab admission: n/a  Possible need for SNF placement upon discharge: Possibly  Patient Condition: I have reviewed medical records from [place], spoken with CM, and patient and son. I met with patient at the bedside and discussed via phone for inpatient rehabilitation assessment.  Patient will benefit from ongoing PT and OT, can actively participate in 3 hours of therapy a day 5 days of the week, and can make measurable gains during the admission.  Patient will also benefit from the coordinated team approach during an Inpatient Acute Rehabilitation admission.  The patient will receive intensive therapy as well as Rehabilitation physician, nursing, social worker, and care management interventions.  Due to safety, disease management,  medication administration, pain management and patient education the patient requires 24 hour a day rehabilitation nursing.  The patient is currently min to mod assist with mobility and basic ADLs.  Discharge setting and therapy post discharge at home is anticipated.  Patient has agreed to participate in the Acute Inpatient Rehabilitation Program and will admit today.  Preadmission Screen Completed By: Michel Santee, PT, DPT 03/05/2020 10:25 AM ______________________________________________________________________   Discussed status with Dr. Dagoberto Ligas on 03/05/20  at 10:25 AM  and received approval for admission today.  Admission Coordinator:  Michel Santee, PT, DPT time 10:25 AM Sudie Grumbling 03/05/20    Assessment/Plan: Diagnosis: 1. Does the need for close, 24 hr/day Medical supervision in concert with the patient's rehab needs make it  unreasonable for this patient to be served in a less intensive setting? Yes 2. Co-Morbidities requiring supervision/potential complications: HTN, Afib with RVR, GI bleed, HLD, depression, tobacco/EtOH abuse 3. Due to bladder management, bowel management, safety, skin/wound care, disease management, medication administration, pain management and patient education, does the patient require 24 hr/day rehab nursing? Yes 4. Does the patient require coordinated care of a physician, rehab nurse, PT, OT, and SLP to address physical and functional deficits in the context of the above medical diagnosis(es)? Yes Addressing deficits in the following areas: balance, endurance, locomotion, strength, transferring, bathing, dressing, feeding, grooming and toileting 5. Can the patient actively participate in an intensive therapy program of at least 3 hrs of therapy 5 days a week? Yes 6. The potential for patient to make measurable gains while on inpatient rehab is good 7. Anticipated functional outcomes upon discharge from inpatient rehab: modified independent and supervision PT, modified independent and supervision OT, n/a SLP 8. Estimated rehab length of stay to reach the above functional goals is: 7-10 days 9. Anticipated discharge destination: Home 10. Overall Rehab/Functional Prognosis: good   MD Signature:

## 2020-02-28 NOTE — Progress Notes (Signed)
Physical Therapy Treatment Patient Details Name: Darryl Diaz MRN: [id number] DOB: [birth year] Today's Date: 02/28/2020    History of Present Illness 79yo male presenting with history of syncope and A-fib with RVR. Note recent hospital admission 9/29-10/1 also for A-fib with RVR during which he received cardioversion. He now returns after syncopal episode which occurred in his car prior to leaving home. Received an additional cardioversion 10/18. 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  He is very eager to mobilize and motivated to participate.  Focused on transfer and gait training as lunch tray had arrived.  Pt continues to benefit from skilled rehab in a post acute setting to maximize functional gains and improve safety before returning home.      Follow Up Recommendations  CIR;Supervision for mobility/OOB     Equipment Recommendations  Other (comment) (TBD at next venue.)    Recommendations for Other Services       Precautions / Restrictions Precautions Precautions: Fall Precaution Comments: history of falls, impulsive, some shaking with intentional movements Restrictions Weight Bearing Restrictions: No    Mobility  Bed Mobility Overal bed mobility: Needs Assistance Bed Mobility: Supine to Sit;Sit to Supine     Supine to sit: Supervision     General bed mobility comments: Increased time and effort to rise into standing.  No asisstance needed.  Transfers Overall transfer level: Needs assistance Equipment used: Rolling walker (2 wheeled) Transfers: Sit to/from Stand Sit to Stand: Min assist         General transfer comment: MIn assistance to boost into standing. Noted poor eccentric loading returning to seated surface.  Ambulation/Gait Ambulation/Gait assistance: Min assist Gait Distance  (Feet): 50 Feet Assistive device: Rolling walker (2 wheeled) Gait Pattern/deviations: Step-through pattern;Wide base of support;Trunk flexed;Shuffle Gait velocity: slowed   General Gait Details: Mildly unsteady with poor balance and safety during turns and backing.  Pt very motivated and required cues for pacing.  Increased RW height to improve fit although it may need to be elevated again next session as it remains a bit to low for his tall stature.   Stairs             Wheelchair Mobility    Modified Rankin (Stroke Patients Only)       Balance Overall balance assessment: Needs assistance Sitting-balance support: Feet supported;Bilateral upper extremity supported Sitting balance-Leahy Scale: Fair Sitting balance - Comments: no LOB reaching feet at EOB   Standing balance support: Bilateral upper extremity supported;During functional activity Standing balance-Leahy Scale: Poor                              Cognition Arousal/Alertness: Awake/alert Behavior During Therapy: Anxious;Impulsive Overall Cognitive Status: Impaired/Different from baseline Area of Impairment: Following commands;Safety/judgement;Problem solving                       Following Commands: Follows one step commands consistently Safety/Judgement: Decreased awareness of safety;Decreased awareness of deficits   Problem Solving: Difficulty sequencing;Requires verbal cues        Exercises      General Comments        Pertinent Vitals/Pain Pain Assessment: No/denies pain    Home Living  Prior Function            PT Goals (current goals can now be found in the care plan section) Acute Rehab PT Goals Patient Stated Goal: get stronger and more independent and safer - go to Rehab PT Goal Formulation: With patient Potential to Achieve Goals: Fair Progress towards PT goals: Progressing toward goals    Frequency    Min 3X/week      PT  Plan Current plan remains appropriate    Co-evaluation              AM-PAC PT "6 Clicks" Mobility   Outcome Measure  Help needed turning from your back to your side while in a flat bed without using bedrails?: None Help needed moving from lying on your back to sitting on the side of a flat bed without using bedrails?: None Help needed moving to and from a bed to a chair (including a wheelchair)?: A Little Help needed standing up from a chair using your arms (e.g., wheelchair or bedside chair)?: A Little Help needed to walk in hospital room?: A Little Help needed climbing 3-5 steps with a railing? : A Little 6 Click Score: 20    End of Session Equipment Utilized During Treatment: Gait belt Activity Tolerance: Patient tolerated treatment well Patient left: in chair;with call bell/phone within reach;with chair alarm set Nurse Communication: Mobility status PT Visit Diagnosis: History of falling (Z91.81);Muscle weakness (generalized) (M62.81);Difficulty in walking, not elsewhere classified (R26.2);Pain;Unsteadiness on feet (R26.81);Repeated falls (R29.6) Pain - Right/Left: Right Pain - part of body: Leg     Time: 9574-7340 PT Time Calculation (min) (ACUTE ONLY): 12 min  Charges:  $Gait Training: 8-22 mins                     Erasmo Leventhal , PTA Acute Rehabilitation Services Pager [phone] Office [phone]     Darryl Diaz 02/28/2020, 12:25 PM

## 2020-02-28 NOTE — Progress Notes (Signed)
Progress Note  Patient Name: Darryl Diaz Date of Encounter: 02/28/2020  Primary Cardiologist: Sherren Mocha, MD   Subjective  Doing well. States he is getting bored but amenable to go to in-patient rehab as significant fall risk at home. Would likely need 24/7 supervision upon discharge which has been communicated to the family.  Remains in sinus rhythm with HR 70-90s. BP well controlled.  Inpatient Medications    Scheduled Meds: . amiodarone  400 mg Oral BID  . apixaban  5 mg Oral BID  . atorvastatin  20 mg Oral Daily  . escitalopram  10 mg Oral Daily  . ferrous sulfate  325 mg Oral BID WC  . folic acid  1 mg Oral Daily  . loratadine  10 mg Oral Daily  . miconazole nitrate   Topical BID  . pantoprazole  40 mg Oral QAC breakfast  . sodium chloride flush  3 mL Intravenous Once  . vitamin B-1  250 mg Oral Daily   Continuous Infusions:  PRN Meds: acetaminophen, ipratropium, metoprolol tartrate, nitroGLYCERIN, ondansetron (ZOFRAN) IV   Vital Signs    Vitals:   02/27/20 2355 02/28/20 0317 02/28/20 0451 02/28/20 0751  BP: 111/87 123/87  106/71  Pulse: 94 94    Resp: 20 16  (!) 25  Temp: 97.7 F (36.5 C) 97.9 F (36.6 C)  98.2 F (36.8 C)  TempSrc: Oral Oral  Oral  SpO2: 99% 99%    Weight:   104.5 kg   Height:        Intake/Output Summary (Last 24 hours) at 02/28/2020 1045 Last data filed at 02/28/2020 0932 Gross per 24 hour  Intake 535.97 ml  Output 725 ml  Net -189.03 ml   Filed Weights   02/26/20 0400 02/27/20 0400 02/28/20 0451  Weight: 101.1 kg 103.2 kg 104.5 kg    Telemetry    Sinus rhythm with HR 70-90s- Personally Reviewed  ECG    No new ECG tracings- Personally Reviewed  Physical Exam   GEN: Laying comfortably in bed Neck: No JVD, no carotid bruits Cardiac: RRR, no murmurs Respiratory: CTAB GI: Soft, ND, +BS MS: Warm, no significant edema Neuro:  Nonfocal, moving all extremities spontaneously Psych: Normal affect   Labs      Chemistry Recent Labs  Lab 02/25/20 0903 02/26/20 0023 02/27/20 0020  NA 136 137 133*  K 3.8 4.4 5.2*  CL 104 104 104  CO2 24 24 22   GLUCOSE 137* 100* 118*  BUN 11 12 19   CREATININE 0.99 1.06 1.50*  CALCIUM 8.7* 8.8* 8.9  GFRNONAA >60 >60 44*  ANIONGAP 8 9 7      Hematology Recent Labs  Lab 02/25/20 0903 02/26/20 0023 02/27/20 0020  WBC 11.3* 11.8* 12.5*  RBC 3.46* 3.40* 3.33*  HGB 10.8* 10.7* 10.5*  HCT 33.6* 33.3* 32.6*  MCV 97.1 97.9 97.9  MCH 31.2 31.5 31.5  MCHC 32.1 32.1 32.2  RDW 14.7 14.7 14.8  PLT 216 210 195    Cardiac EnzymesNo results for input(s): TROPONINI in the last 168 hours. No results for input(s): TROPIPOC in the last 168 hours.   BNP Recent Labs  Lab 02/22/20 1433  BNP 266.2*     DDimer  Recent Labs  Lab 02/22/20 2051  DDIMER 0.38     Radiology    No results found.  Cardiac Studies  TTE 02/23/20 1. Left ventricular ejection fraction, by estimation, is 60 to 65%. The  left ventricle has normal function. The left ventricle has  no regional  wall motion abnormalities. Left ventricular diastolic function could not  be evaluated.  2. Apical RV hyperkinesis. Right ventricular systolic function is normal.  The right ventricular size is mildly enlarged.  3. Left atrial size was mildly dilated.  4. The mitral valve is grossly normal. Trivial mitral valve  regurgitation.  5. The aortic valve was not well visualized. Aortic valve regurgitation  is not visualized.  6. There is dilatation of the aortic root, measuring 42 mm. There is  dilatation of the ascending aorta, measuring 39 mm.  7. The inferior vena cava is dilated in size with <50% respiratory  variability, suggesting right atrial pressure of 15 mmHg.   Echocardiogram 02/06/20: 1. Left ventricular ejection fraction, by estimation, is 60 to 65%. The  left ventricle has normal function. The left ventricle has no regional  wall motion abnormalities. Left ventricular  diastolic parameters are  indeterminate.  2. There is mild hyperkinesis of the RV apex relative to the RV free  wall. Recommend clinical correlation if concern for underlying pulmonary  embolism.   . Right ventricular systolic function is low normal. The right  ventricular size is mildly enlarged. There is mildly elevated pulmonary  artery systolic pressure.  3. Left atrial size was mildly dilated.  4. The mitral valve is normal in structure. Suspect at least mild,  eccentric mitral regurgitation secondary to tethering of the posterior  mitral leaflet. mitral valve regurgitation.  5. The aortic valve is tricuspid. There is mild calcification of the  aortic valve. There is moderate thickening of the aortic valve. Aortic  valve regurgitation is not visualized. Mild to moderate aortic valve  sclerosis/calcification is present, without  any evidence of aortic stenosis.  6. Aortic dilatation noted. There is mild dilatation of the ascending  aorta, measuring 40 mm. There is mild dilatation of the aortic root,  measuring 39 mm.  7. The inferior vena cava is dilated in size with <50% respiratory  variability, suggesting right atrial pressure of 15 mmHg.   Patient Profile     79 y.o. 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 Now improved and back in sinus rhythm. Plan for CIR.  Assessment & Plan    1. Syncope: patient presented with sudden loss of consciousness while sitting in his car. No prodromal symptoms. TTE unchanged from prior. Myoview negative for ischemia. D-dimer negative. Possible unstable Afib with RVR now back in SR on amiodarone. -TTE relatively unchanged from prior with LVEF 60-65%, RV mildly enlarged but with normal function -Myoview with no  ischemia, normal EF -Had one episode NSVT but no VT on monitor -D-dimer negative  -S/p DCCV 10/18 with initial return to sinus rhythm with recurrent Afib on 10/20 now back in sinus rhythm after amiodarone bolus and gtt -Plan for CIR; okay from cardiology standpoint to go to rehab pending bed availability  2. Persistent atrial fibrillation:   -S/p successful repeat DCCV on 10/17 however flipped back into Afib with RVR on 10/20. Now back in sinus -Continue amiodarone 400mg  BID for at least 2 weeks; then transition to 200mg  BID thereafter  -Continue xarelto for [facility] -Will follow-up with Afib clinic for further management after discharge  3. Chronic diastolic CHF:  Stable. Euvolemic.  -Trend I/Os and daily weights -Monitor I/Os and daily weights  4.  HTN: BP stable -Well controlled  5. HLD: LDL 29 04/2019 - Continue atorvastatin  6. Gait instability:  -Plan for CIR -Likely needs 24/7 supervision at home; was discussed between rehab medicine and family-->very much appreciate their guidance and input -Medically cleared for rehab pending bed availability  Okay to go to in-patient rehab from CV perspective.   For questions or updates, please contact [place] Please consult [email or web address] for contact info under Cardiology/STEMI.      Signed, Freada Bergeron, MD  02/28/2020, 10:45 AM   [phone]

## 2020-02-28 NOTE — Care Management Important Message (Signed)
Important Message  Patient Details  Name: Darryl Diaz MRN: [id number] Date of Birth: [birth year]   Medicare Important Message Given:  Yes     Orbie Pyo 02/28/2020, 12:30 PM

## 2020-02-28 NOTE — Plan of Care (Signed)

## 2020-02-28 NOTE — Progress Notes (Signed)
Inpatient Rehab Admissions Coordinator:   I do not have a bed available for this patient to admit to CIR today.  Will continue to follow for timing of possible admission pending bed availability.    Shann Medal, PT, DPT Admissions Coordinator [phone] 02/28/20  10:52 AM

## 2020-02-29 DIAGNOSIS — R55 Syncope and collapse: Secondary | ICD-10-CM | POA: Diagnosis not present

## 2020-02-29 DIAGNOSIS — I480 Paroxysmal atrial fibrillation: Secondary | ICD-10-CM

## 2020-02-29 NOTE — Progress Notes (Addendum)
Progress Note  Patient Name: Darryl Diaz Date of Encounter: 02/29/2020  Primary Cardiologist: Sherren Mocha, MD   Subjective   Anxious about when he will be going to CIR. Had recurrent paroxysmal atrial fibrillation this AM but was asymptomatic. No chest pain or palpitations.   Inpatient Medications    Scheduled Meds: . amiodarone  400 mg Oral BID  . apixaban  5 mg Oral BID  . atorvastatin  20 mg Oral Daily  . escitalopram  10 mg Oral Daily  . ferrous sulfate  325 mg Oral BID WC  . folic acid  1 mg Oral Daily  . loratadine  10 mg Oral Daily  . miconazole nitrate   Topical BID  . pantoprazole  40 mg Oral QAC breakfast  . sodium chloride flush  3 mL Intravenous Once  . vitamin B-1  250 mg Oral Daily   Continuous Infusions:  PRN Meds: acetaminophen, ipratropium, metoprolol tartrate, nitroGLYCERIN, ondansetron (ZOFRAN) IV   Vital Signs    Vitals:   02/28/20 2349 02/29/20 0349 02/29/20 0401 02/29/20 0821  BP: 131/82 133/81  113/77  Pulse: 87 87  66  Resp: 20 20  20   Temp: 97.6 F (36.4 C) 98 F (36.7 C)  (!) 97.4 F (36.3 C)  TempSrc: Oral Oral  Oral  SpO2: 95% 94%    Weight:   103.2 kg   Height:        Intake/Output Summary (Last 24 hours) at 02/29/2020 1104 Last data filed at 02/29/2020 0351 Gross per 24 hour  Intake --  Output 550 ml  Net -550 ml    Last 3 Weights 02/29/2020 02/28/2020 02/27/2020  Weight (lbs) 227 lb 8.2 oz 230 lb 6.1 oz 227 lb 8.2 oz  Weight (kg) 103.2 kg 104.5 kg 103.2 kg      Telemetry    Atrial fibrillation with RVR around 0800 until 0945 with HR in the 120's to 130's. Converted back to NSR and HR currently in the 70's to 80's. 4 beats NSVT.  - Personally Reviewed  ECG    Atrial fibrillation with RVR, HR 130 with PVC's. TWI along anterolateral leads. - Personally Reviewed  Physical Exam   General: Well developed, elderly male appearing in no acute distress. Head: Normocephalic, atraumatic.  Neck: Supple without  bruits, JVD not elevated. Lungs:  Resp regular and unlabored, CTA without wheezing or rales. Heart: RRR, S1, S2, no S3, S4, or murmur; no rub. Abdomen: Soft, non-tender, non-distended with normoactive bowel sounds. No hepatomegaly. No rebound/guarding. No obvious abdominal masses. Extremities: No clubbing or cyanosis, trace lower extremity edema. Distal pedal pulses are 2+ bilaterally. Neuro: Alert and oriented X 3. Moves all extremities spontaneously. Psych: Normal affect.  Labs    Chemistry Recent Labs  Lab 02/25/20 0903 02/26/20 0023 02/27/20 0020  NA 136 137 133*  K 3.8 4.4 5.2*  CL 104 104 104  CO2 24 24 22   GLUCOSE 137* 100* 118*  BUN 11 12 19   CREATININE 0.99 1.06 1.50*  CALCIUM 8.7* 8.8* 8.9  GFRNONAA >60 >60 44*  ANIONGAP 8 9 7      Hematology Recent Labs  Lab 02/25/20 0903 02/26/20 0023 02/27/20 0020  WBC 11.3* 11.8* 12.5*  RBC 3.46* 3.40* 3.33*  HGB 10.8* 10.7* 10.5*  HCT 33.6* 33.3* 32.6*  MCV 97.1 97.9 97.9  MCH 31.2 31.5 31.5  MCHC 32.1 32.1 32.2  RDW 14.7 14.7 14.8  PLT 216 210 195    Cardiac EnzymesNo results for input(s): TROPONINI in  the last 168 hours. No results for input(s): TROPIPOC in the last 168 hours.   BNP Recent Labs  Lab 02/22/20 1433  BNP 266.2*     DDimer  Recent Labs  Lab 02/22/20 2051  DDIMER 0.38     Radiology    No results found.  Cardiac Studies   Echocardiogram: 02/2020 IMPRESSIONS    1. Left ventricular ejection fraction, by estimation, is 60 to 65%. The  left ventricle has normal function. The left ventricle has no regional  wall motion abnormalities. Left ventricular diastolic function could not  be evaluated.  2. Apical RV hyperkinesis. Right ventricular systolic function is normal.  The right ventricular size is mildly enlarged.  3. Left atrial size was mildly dilated.  4. The mitral valve is grossly normal. Trivial mitral valve  regurgitation.  5. The aortic valve was not well visualized.  Aortic valve regurgitation  is not visualized.  6. There is dilatation of the aortic root, measuring 42 mm. There is  dilatation of the ascending aorta, measuring 39 mm.  7. The inferior vena cava is dilated in size with <50% respiratory  variability, suggesting right atrial pressure of 15 mmHg.    NST: 02/23/2020  There was no ST segment deviation noted during stress.  The study is normal.  This is a low risk study.  The left ventricular ejection fraction is normal (55-65%).   Patient Profile     79 y.o. male w/ PMH of paroxysmal atrial fibrillation (bradycardiac arrest in 04/2019 while on Lopressor/Cardizem/Amiodarone/Gradpefruit juice, recent successful DCCV on 02/06/2020 with recurrence as an outpatient), HTN, HLD, GERD, OSA (intolerant to CPAP) who is currently admitted for evaluation of a syncopal episode.   Assessment & Plan    1. Paroxysmal Atrial Fibrillation - He underwent DCCV on 02/06/2020 and was found to be back in atrial fibrillation during his office visit on 02/18/2020 and was reviewed with Dr. Caryl Comes at that time and recommended to start Flecainide 100mg  BID with plans for DCCV the following week. Never picked up Rx but was in atrial fibrillation with RVR on admission and was recommended to not start until ischemic eval complete. Underwent DCCV on 02/24/2020 with recurrence on 02/26/2020 and started on Amiodarone bolus and drip. Converted back to NSR on 10/21 and switched to PO Amiodarone 400mg  BID for 2 weeks followed by 200mg  BID afterwards.  - Had recurrence this AM but converted back to NSR within 2 hours. No BB or CCB given prior brady-arrest. Would continue with Amiodarone load for now. He remains on Eliquis 5mg  BID for anticoagulation.   2. Syncopal Episode - Presented with a syncopal episode which occurred while sitting in his car. No prodromal symptoms and unclear etiology. Echo without acute findings and NST this admission showed no evidence of ischemia.    3. Chronic Diastolic CHF - He has required intermittent doses of Lasix this admission but appears euvolemic on exam today. Would hold additional diuretic therapy for now with creatinine increased to 1.50. No additional K-dur for now given K+ at 5.2.  4. HTN - Listed in [place]. Not currently on medical therapy and BP has been stable at 104/69 - 133/81 within the past 24 hours.   5. HLD - LDL was at 29 in 04/2019. He has been continued on Atorvastatin 20mg  daily.   6. Deconditioning/Instability - PT/OT recommended CIR and planning for discharge to CIR once medically stable but no beds available on 10/22.    For questions or updates, please contact [place]  [facility] Please consult [email or web address] for contact info under Cardiology/STEMI.   Signed, Erma Heritage , PA-C 11:04 AM 02/29/2020 Pager: [phone]   I have seen, examined the patient, and reviewed the above assessment and plan.  Changes to above are made where necessary.  On exam, RRR.  Chronically ill appearing. The patient is waiting on CIR. Continues to have afib episodes.  Continue oral amiodarone load with close follow-up in the AF clinic and with general cardiology post discharge    Co Sign: Thompson Grayer, MD 02/29/2020 11:24 AM

## 2020-02-29 NOTE — Progress Notes (Signed)
   Called by RN. Pt went back into AFib with HR 120-130s this AM.  BP 113/77.  Pt asymptomatic. Due for Amiodarone now. 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  PLAN: Give usual dose of amiodarone now. Hopefully, will go back into normal sinus rhythm on his own. If still in AF w HRs in the 120/130s in 20 minutes, can give dose of IV Lopressor 2.5 mg.  Richardson Dopp, PA-C    02/29/2020 8:32 AM

## 2020-03-01 DIAGNOSIS — I4891 Unspecified atrial fibrillation: Secondary | ICD-10-CM

## 2020-03-01 DIAGNOSIS — R55 Syncope and collapse: Secondary | ICD-10-CM | POA: Diagnosis not present

## 2020-03-01 NOTE — Plan of Care (Signed)
  Problem: Education: Goal: Knowledge of General Education information will improve Description: Including pain rating scale, medication(s)/side effects and non-pharmacologic comfort measures 03/01/2020 0748 by Shanon Ace, RN Outcome: Progressing 03/01/2020 0748 by Shanon Ace, RN Outcome: Not Progressing   Problem: Health Behavior/Discharge Planning: Goal: Ability to manage health-related needs will improve 03/01/2020 0748 by Shanon Ace, RN Outcome: Progressing 03/01/2020 0748 by Shanon Ace, RN Outcome: Not Progressing   Problem: Clinical Measurements: Goal: Ability to maintain clinical measurements within normal limits will improve 03/01/2020 0748 by Shanon Ace, RN Outcome: Progressing 03/01/2020 0748 by Shanon Ace, RN Outcome: Not Progressing Goal: Will remain free from infection 03/01/2020 0748 by Shanon Ace, RN Outcome: Progressing 03/01/2020 0748 by Shanon Ace, RN Outcome: Not Progressing Goal: Diagnostic test results will improve 03/01/2020 0748 by Shanon Ace, RN Outcome: Progressing 03/01/2020 0748 by Shanon Ace, RN Outcome: Not Progressing Goal: Respiratory complications will improve 03/01/2020 0748 by Shanon Ace, RN Outcome: Progressing 03/01/2020 0748 by Shanon Ace, RN Outcome: Not Progressing Goal: Cardiovascular complication will be avoided 03/01/2020 0748 by Shanon Ace, RN Outcome: Progressing 03/01/2020 0748 by Shanon Ace, RN Outcome: Not Progressing   Problem: Activity: Goal: Risk for activity intolerance will decrease 03/01/2020 0748 by Shanon Ace, RN Outcome: Progressing 03/01/2020 0748 by Shanon Ace, RN Outcome: Not Progressing   Problem: Nutrition: Goal: Adequate nutrition will be maintained 03/01/2020 0748 by Shanon Ace, RN Outcome: Progressing 03/01/2020 0748 by Shanon Ace, RN Outcome: Not Progressing   Problem: Coping: Goal: Level of anxiety will decrease 03/01/2020 0748 by Shanon Ace, RN Outcome: Progressing 03/01/2020 0748 by Shanon Ace, RN Outcome: Not Progressing   Problem: Elimination: Goal: Will not experience complications related to bowel motility 03/01/2020 0748 by Shanon Ace, RN Outcome: Progressing 03/01/2020 0748 by Shanon Ace, RN Outcome: Not Progressing Goal: Will not experience complications related to urinary retention 03/01/2020 0748 by Shanon Ace, RN Outcome: Progressing 03/01/2020 0748 by Shanon Ace, RN Outcome: Not Progressing   Problem: Pain Managment: Goal: General experience of comfort will improve 03/01/2020 0748 by Shanon Ace, RN Outcome: Progressing 03/01/2020 0748 by Shanon Ace, RN Outcome: Not Progressing   Problem: Safety: Goal: Ability to remain free from injury will improve 03/01/2020 0748 by Shanon Ace, RN Outcome: Progressing 03/01/2020 0748 by Shanon Ace, RN Outcome: Not Progressing   Problem: Skin Integrity: Goal: Risk for impaired skin integrity will decrease 03/01/2020 0748 by Shanon Ace, RN Outcome: Progressing 03/01/2020 0748 by Shanon Ace, RN Outcome: Not Progressing

## 2020-03-01 NOTE — Plan of Care (Signed)

## 2020-03-01 NOTE — Progress Notes (Signed)
   Progress Note   Subjective   Doing well today, the patient denies CP or SOB.  No new concerns  Inpatient Medications    Scheduled Meds: . amiodarone  400 mg Oral BID  . apixaban  5 mg Oral BID  . atorvastatin  20 mg Oral Daily  . escitalopram  10 mg Oral Daily  . ferrous sulfate  325 mg Oral BID WC  . folic acid  1 mg Oral Daily  . loratadine  10 mg Oral Daily  . miconazole nitrate   Topical BID  . pantoprazole  40 mg Oral QAC breakfast  . sodium chloride flush  3 mL Intravenous Once  . vitamin B-1  250 mg Oral Daily   Continuous Infusions:  PRN Meds: acetaminophen, ipratropium, metoprolol tartrate, nitroGLYCERIN, ondansetron (ZOFRAN) IV   Vital Signs    Vitals:   02/29/20 2000 03/01/20 0000 03/01/20 0400 03/01/20 0754  BP: 127/77 121/89 128/73 119/69  Pulse: 81 81 84 89  Resp: 20 19 19 17   Temp: 97.6 F (36.4 C) 97.8 F (36.6 C) 97.7 F (36.5 C) 97.6 F (36.4 C)  TempSrc: Oral Oral Oral Oral  SpO2: 100% 98% 95% 90%  Weight:   102.8 kg   Height:       No intake or output data in the 24 hours ending 03/01/20 0959 Filed Weights   02/28/20 0451 02/29/20 0401 03/01/20 0400  Weight: 104.5 kg 103.2 kg 102.8 kg    Telemetry    Sinus rhythm - Personally Reviewed  Physical Exam   GEN- The patient is chronically ill appearing, alert and oriented x 3 today.   Head- normocephalic, atraumatic Eyes-  Sclera clear, conjunctiva pink Ears- hearing intact Oropharynx- clear Neck- supple, Lungs-  normal work of breathing Heart- Regular rate and rhythm  GI- soft  Extremities- no clubbing, cyanosis, + dependant edema  MS- diffuse atrophy   Labs    Chemistry Recent Labs  Lab 02/25/20 0903 02/26/20 0023 02/27/20 0020  NA 136 137 133*  K 3.8 4.4 5.2*  CL 104 104 104  CO2 24 24 22   GLUCOSE 137* 100* 118*  BUN 11 12 19   CREATININE 0.99 1.06 1.50*  CALCIUM 8.7* 8.8* 8.9  GFRNONAA >60 >60 44*  ANIONGAP 8 9 7      Hematology Recent Labs  Lab  02/25/20 0903 02/26/20 0023 02/27/20 0020  WBC 11.3* 11.8* 12.5*  RBC 3.46* 3.40* 3.33*  HGB 10.8* 10.7* 10.5*  HCT 33.6* 33.3* 32.6*  MCV 97.1 97.9 97.9  MCH 31.2 31.5 31.5  MCHC 32.1 32.1 32.2  RDW 14.7 14.7 14.8  PLT 216 210 195     Patient ID  79 y.o. male w/ PMH of paroxysmal atrial fibrillation (bradycardiac arrest in 04/2019 while on Lopressor/Cardizem/Amiodarone/Gradpefruit juice, recent successful DCCV on 02/06/2020 with recurrence as an outpatient), HTN, HLD, GERD, OSA (intolerant to CPAP) who is currently admitted for evaluation of a syncopal episode.   Assessment & Plan    1.  Syncope Likely due to bradycardia.  Medicines have been adjusted No high risk findings on ekg or echo  2. Paroxysmal atrial fibrillation Rhythm is stable Continue amiodarone with close outpatient follow-up Off CCBs and BBs due to bradycardia. On eliquis for stroke prevention  3. HTN Stable No change required today  4. Chronic diastolic dysfunction Stable No change required today  5. deconditioningn Awaiting discharge to CIR once bed is available  Thompson Grayer MD, [facility] 03/01/2020 9:59 AM

## 2020-03-02 ENCOUNTER — Ambulatory Visit (HOSPITAL_COMMUNITY): Payer: Medicare Other | Admitting: Nurse Practitioner

## 2020-03-02 DIAGNOSIS — R55 Syncope and collapse: Secondary | ICD-10-CM | POA: Diagnosis not present

## 2020-03-02 DIAGNOSIS — I10 Essential (primary) hypertension: Secondary | ICD-10-CM | POA: Diagnosis not present

## 2020-03-02 DIAGNOSIS — I480 Paroxysmal atrial fibrillation: Secondary | ICD-10-CM | POA: Diagnosis not present

## 2020-03-02 NOTE — Progress Notes (Signed)
Physical Therapy Treatment Patient Details Name: Darryl Diaz MRN: [id number] DOB: August 19, 1940 Today's Date: 03/02/2020    History of Present Illness 79yo male presenting with history of syncope and A-fib with RVR. Note recent hospital admission 9/29-10/1 also for A-fib with RVR during which he received cardioversion. He now returns after syncopal episode which occurred in his car prior to leaving home. Received an additional cardioversion 10/18. PMH hx bradycardic MI, hx falls at home, HLD, HTN, MDD, persistent A-fib with RVR, ankle surgery, cardioversion, B THAs (left in 2017, rignt anterior approach 6/21)    PT Comments    Pt is getting increasingly impatient about going home. Pt reports that his sister will come stay with him and that his son is coming from [place] to stay with him. Pt is progressing towards his goals however continues to have increased instability 2x LoB requiring modA for regaining balance with gait making him a fall risk and why he needs 24 hr assist. PT continues to recommend CIR and pt agreeable if he has a bed 10/26 if not, pt request d/c home with HHPT. PT will continue to follow acutely.   Follow Up Recommendations  CIR;Supervision/Assistance - 24 hour;Home health PT;Other (comment) (if not admitted to CIR 10/26, will need 24 hr sup./HHPT )     Equipment Recommendations  Rolling walker with 5" wheels (TBD at next venue)       Precautions / Restrictions Precautions Precautions: Fall Precaution Comments: history of falls, impulsive, some shaking with intentional movements Restrictions Weight Bearing Restrictions: No    Mobility  Bed Mobility Overal bed mobility: Needs Assistance Bed Mobility: Supine to Sit     Supine to sit: Min guard     General bed mobility comments: increased effort to come to EoB  Transfers Overall transfer level: Needs assistance Equipment used: Rolling walker (2 wheeled) Transfers: Sit to/from Stand Sit to Stand: Min  guard         General transfer comment: min guard for safety, able to teach back proper hand placement for power up and steadying  Ambulation/Gait Ambulation/Gait assistance: Min assist;Mod assist Gait Distance (Feet): 50 Feet (x3) Assistive device: Rolling walker (2 wheeled) Gait Pattern/deviations: Step-through pattern;Decreased step length - right;Decreased step length - left;Shuffle Gait velocity: slowed   General Gait Details: min A for steadying with 2x LoB requiring modA for steadying, pt requires 3x standing rest break due to SaO2 drop to min 80s, poor pleth signal however also requires increased cuing for pursed lip breathing to recover         Balance Overall balance assessment: Needs assistance Sitting-balance support: Feet supported;Bilateral upper extremity supported Sitting balance-Leahy Scale: Fair     Standing balance support: Bilateral upper extremity supported;During functional activity Standing balance-Leahy Scale: Poor                              Cognition Arousal/Alertness: Awake/alert Behavior During Therapy: Anxious;Impulsive Overall Cognitive Status: Impaired/Different from baseline Area of Impairment: Attention;Memory;Following commands;Safety/judgement;Awareness;Problem solving                   Current Attention Level: Selective;Alternating Memory: Decreased short-term memory Following Commands: Follows multi-step commands inconsistently Safety/Judgement: Decreased awareness of safety;Decreased awareness of deficits   Problem Solving: Difficulty sequencing;Requires verbal cues General Comments: pt continues to be impatient about getting home, continues to have decreased awareness of his deficits          General Comments General comments (  skin integrity, edema, etc.): max HR noted in mid 110s       Pertinent Vitals/Pain Pain Assessment: No/denies pain Faces Pain Scale: No hurt           PT Goals (current goals  can now be found in the care plan section) Acute Rehab PT Goals Patient Stated Goal: get stronger and more independent and safer - go to Rehab PT Goal Formulation: With patient Time For Goal Achievement: 03/09/20 Potential to Achieve Goals: Fair Progress towards PT goals: Progressing toward goals    Frequency    Min 3X/week      PT Plan Current plan remains appropriate;Discharge plan needs to be updated       AM-PAC PT "6 Clicks" Mobility   Outcome Measure  Help needed turning from your back to your side while in a flat bed without using bedrails?: None Help needed moving from lying on your back to sitting on the side of a flat bed without using bedrails?: None Help needed moving to and from a bed to a chair (including a wheelchair)?: A Little Help needed standing up from a chair using your arms (e.g., wheelchair or bedside chair)?: A Little Help needed to walk in hospital room?: A Little Help needed climbing 3-5 steps with a railing? : Total 6 Click Score: 18    End of Session Equipment Utilized During Treatment: Gait belt Activity Tolerance: Patient tolerated treatment well Patient left: in bed;with call bell/phone within reach Nurse Communication: Mobility status PT Visit Diagnosis: History of falling (Z91.81);Muscle weakness (generalized) (M62.81);Difficulty in walking, not elsewhere classified (R26.2);Pain;Unsteadiness on feet (R26.81);Repeated falls (R29.6)     Time: 6812-7517 PT Time Calculation (min) (ACUTE ONLY): 18 min  Charges:  $Gait Training: 8-22 mins                     Jamielee Mchale B. Migdalia Dk PT, DPT Acute Rehabilitation Services Pager [phone] Office [phone]    [place] 03/02/2020, 12:57 PM

## 2020-03-02 NOTE — Progress Notes (Signed)
Inpatient Rehab Admissions Coordinator:   I have no beds available for this patient to admit to CIR today.  Will continue to follow for timing of potential admission pending bed availability.   Shann Medal, PT, DPT Admissions Coordinator [phone] 03/02/20  9:56 AM

## 2020-03-02 NOTE — Progress Notes (Signed)
Progress Note   Subjective    Denies any chest pain or SOB.   Inpatient Medications    Scheduled Meds: . amiodarone  400 mg Oral BID  . apixaban  5 mg Oral BID  . atorvastatin  20 mg Oral Daily  . escitalopram  10 mg Oral Daily  . ferrous sulfate  325 mg Oral BID WC  . folic acid  1 mg Oral Daily  . loratadine  10 mg Oral Daily  . miconazole nitrate   Topical BID  . pantoprazole  40 mg Oral QAC breakfast  . sodium chloride flush  3 mL Intravenous Once  . vitamin B-1  250 mg Oral Daily   Continuous Infusions:  PRN Meds: acetaminophen, ipratropium, metoprolol tartrate, nitroGLYCERIN, ondansetron (ZOFRAN) IV   Vital Signs    Vitals:   03/01/20 2005 03/02/20 0045 03/02/20 0435 03/02/20 0733  BP: 128/77 (!) 121/59 139/87 125/69  Pulse: 86 81 87 82  Resp: (!) 24 20 16 18   Temp: 98.3 F (36.8 C) 98 F (36.7 C) 97.9 F (36.6 C) 97.8 F (36.6 C)  TempSrc: Oral  Oral Oral  SpO2: 96% 93% 93% 95%  Weight:   104.6 kg   Height:        Intake/Output Summary (Last 24 hours) at 03/02/2020 7494 Last data filed at 03/02/2020 0500 Gross per 24 hour  Intake 360 ml  Output 100 ml  Net 260 ml   Filed Weights   02/29/20 0401 03/01/20 0400 03/02/20 0435  Weight: 103.2 kg 102.8 kg 104.6 kg    Telemetry    NSR - Personally Reviewed  Physical Exam   GEN: Well nourished, well developed in no acute distress HEENT: Normal NECK: No JVD; No carotid bruits LYMPHATICS: No lymphadenopathy CARDIAC:RRR, no murmurs, rubs, gallops RESPIRATORY:  Clear to auscultation without rales, wheezing or rhonchi  ABDOMEN: Soft, non-tender, non-distended MUSCULOSKELETAL:  No edema; No deformity  SKIN: Warm and dry NEUROLOGIC:  Alert and oriented x 3 PSYCHIATRIC:  Normal affect    Labs    Chemistry Recent Labs  Lab 02/25/20 0903 02/26/20 0023 02/27/20 0020  NA 136 137 133*  K 3.8 4.4 5.2*  CL 104 104 104  CO2 24 24 22   GLUCOSE 137* 100* 118*  BUN 11 12 19   CREATININE 0.99 1.06  1.50*  CALCIUM 8.7* 8.8* 8.9  GFRNONAA >60 >60 44*  ANIONGAP 8 9 7      Hematology Recent Labs  Lab 02/25/20 0903 02/26/20 0023 02/27/20 0020  WBC 11.3* 11.8* 12.5*  RBC 3.46* 3.40* 3.33*  HGB 10.8* 10.7* 10.5*  HCT 33.6* 33.3* 32.6*  MCV 97.1 97.9 97.9  MCH 31.2 31.5 31.5  MCHC 32.1 32.1 32.2  RDW 14.7 14.7 14.8  PLT 216 210 195     Patient ID  79 y.o. male w/ PMH of paroxysmal atrial fibrillation (bradycardiac arrest in 04/2019 while on Lopressor/Cardizem/Amiodarone/Gradpefruit juice, recent successful DCCV on 02/06/2020 with recurrence as an outpatient), HTN, HLD, GERD, OSA (intolerant to CPAP) who is currently admitted for evaluation of a syncopal episode.   Assessment & Plan    1.  Syncope -Likely due to bradycardia.  Medicines have been adjusted and HR in the 80's -he has not had any further dizziness or syncope -No high risk findings on ekg or echo  2. Paroxysmal atrial fibrillation Maintaining NSR Continue amiodarone with close outpatient follow-up Off CCBs and BBs due to bradycardia. On eliquis for stroke prevention  3. HTN BP controlled at 125/79mmHg Currently  not on antihypertensive meds  4. Chronic diastolic dysfunction Appears euvolemic on exam  5. Deconditioning He was Awaiting discharge to CIR once bed is available but he feels that he has progressed and wants to go home with in home PT.  His sister is going to stay with him -I will ask PT to weight in on if they think this would be a safe plan and if we can get home health arranged he can go home today  Thompson Grayer MD, [facility] 03/02/2020 9:04 AM

## 2020-03-02 NOTE — Plan of Care (Signed)
  Problem: Education: Goal: Knowledge of General Education information will improve Description: Including pain rating scale, medication(s)/side effects and non-pharmacologic comfort measures Outcome: Progressing   Problem: Health Behavior/Discharge Planning: Goal: Ability to manage health-related needs will improve Outcome: Progressing   Problem: Clinical Measurements: Goal: Ability to maintain clinical measurements within normal limits will improve Outcome: Progressing   Problem: Clinical Measurements: Goal: Cardiovascular complication will be avoided Outcome: Progressing   Problem: Activity: Goal: Risk for activity intolerance will decrease Outcome: Progressing   Problem: Pain Managment: Goal: General experience of comfort will improve Outcome: Progressing   Problem: Skin Integrity: Goal: Risk for impaired skin integrity will decrease Outcome: Progressing

## 2020-03-03 DIAGNOSIS — I480 Paroxysmal atrial fibrillation: Secondary | ICD-10-CM | POA: Diagnosis not present

## 2020-03-03 DIAGNOSIS — R55 Syncope and collapse: Secondary | ICD-10-CM | POA: Diagnosis not present

## 2020-03-03 DIAGNOSIS — I10 Essential (primary) hypertension: Secondary | ICD-10-CM | POA: Diagnosis not present

## 2020-03-03 MED ORDER — AMIODARONE IV BOLUS ONLY 150 MG/100ML
150.0000 mg | Freq: Once | INTRAVENOUS | Status: AC
  Administered 2020-03-03: 150 mg via INTRAVENOUS
  Filled 2020-03-03: qty 100

## 2020-03-03 MED ORDER — AMIODARONE IV BOLUS ONLY 150 MG/100ML
150.0000 mg | Freq: Once | INTRAVENOUS | Status: DC
  Filled 2020-03-03: qty 100

## 2020-03-03 NOTE — Progress Notes (Signed)
Inpatient Rehab Admissions Coordinator:   I have a bed available for pt to admit to CIR today. Awaiting confirmation from MD that pt is ready to d/c.  Will let pt and TOC team know.   Shann Medal, PT, DPT Admissions Coordinator [phone] 03/03/20  9:51 AM

## 2020-03-03 NOTE — Progress Notes (Signed)
Pt converted back into NSR at 1240 today.

## 2020-03-03 NOTE — Progress Notes (Signed)
Inpatient Rehab Admissions Coordinator:   Notified by Dr. Radford Pax that pt back in AF with RVR. Plan for amio bolus.  Will reassess tomorrow.  Per Dr. Naaman Plummer, HR will need to be controlled at least 24 hrs for CIR admission.  I let pt know.  Will continue to follow.   Shann Medal, PT, DPT Admissions Coordinator [phone] 03/03/20  11:54 AM

## 2020-03-03 NOTE — Progress Notes (Addendum)
Progress Note   Subjective    Anxious to go home today but PT recommended inpt rehab and so we are awaiting bed with CIR.  Denies any chest pain or SOB.  HR elevated today  Inpatient Medications    Scheduled Meds: . amiodarone  400 mg Oral BID  . apixaban  5 mg Oral BID  . atorvastatin  20 mg Oral Daily  . escitalopram  10 mg Oral Daily  . ferrous sulfate  325 mg Oral BID WC  . folic acid  1 mg Oral Daily  . loratadine  10 mg Oral Daily  . miconazole nitrate   Topical BID  . pantoprazole  40 mg Oral QAC breakfast  . sodium chloride flush  3 mL Intravenous Once  . vitamin B-1  250 mg Oral Daily   Continuous Infusions:  PRN Meds: acetaminophen, ipratropium, metoprolol tartrate, nitroGLYCERIN, ondansetron (ZOFRAN) IV   Vital Signs    Vitals:   03/02/20 1958 03/02/20 2328 03/03/20 0300 03/03/20 0709  BP: 135/81 (!) 148/92 (!) 113/57 (!) 134/92  Pulse: 87 91 76 98  Resp: 19 20 18 20   Temp: 98.4 F (36.9 C) 98.1 F (36.7 C) 97.9 F (36.6 C) 98 F (36.7 C)  TempSrc: Oral Oral Oral Oral  SpO2: 96% 92% 98% 93%  Weight:   104.4 kg   Height:        Intake/Output Summary (Last 24 hours) at 03/03/2020 0858 Last data filed at 03/03/2020 0530 Gross per 24 hour  Intake 240 ml  Output 1150 ml  Net -910 ml   Filed Weights   03/01/20 0400 03/02/20 0435 03/03/20 0300  Weight: 102.8 kg 104.6 kg 104.4 kg    Telemetry    Atrial fibrillation with RVR in the 110's - Personally Reviewed  Physical Exam   GEN: Well nourished, well developed in no acute distress HEENT: Normal NECK: No JVD; No carotid bruits LYMPHATICS: No lymphadenopathy CARDIAC:irregularly irregular and tachy, no murmurs, rubs, gallops RESPIRATORY:  Clear to auscultation without rales, wheezing or rhonchi  ABDOMEN: Soft, non-tender, non-distended MUSCULOSKELETAL:  No edema; No deformity  SKIN: Warm and dry NEUROLOGIC:  Alert and oriented x 3 PSYCHIATRIC:  Normal affect    Labs    Chemistry Recent  Labs  Lab 02/25/20 0903 02/26/20 0023 02/27/20 0020  NA 136 137 133*  K 3.8 4.4 5.2*  CL 104 104 104  CO2 24 24 22   GLUCOSE 137* 100* 118*  BUN 11 12 19   CREATININE 0.99 1.06 1.50*  CALCIUM 8.7* 8.8* 8.9  GFRNONAA >60 >60 44*  ANIONGAP 8 9 7      Hematology Recent Labs  Lab 02/25/20 0903 02/26/20 0023 02/27/20 0020  WBC 11.3* 11.8* 12.5*  RBC 3.46* 3.40* 3.33*  HGB 10.8* 10.7* 10.5*  HCT 33.6* 33.3* 32.6*  MCV 97.1 97.9 97.9  MCH 31.2 31.5 31.5  MCHC 32.1 32.1 32.2  RDW 14.7 14.7 14.8  PLT 216 210 195     Patient ID  79 y.o. male w/ PMH of paroxysmal atrial fibrillation (bradycardiac arrest in 04/2019 while on Lopressor/Cardizem/Amiodarone/Gradpefruit juice, recent successful DCCV on 02/06/2020 with recurrence as an outpatient), HTN, HLD, GERD, OSA (intolerant to CPAP) who is currently admitted for evaluation of a syncopal episode.   Assessment & Plan    1.  Syncope -Likely due to bradycardia.  Medicines have been adjusted -he has not had any further dizziness or syncope -No high risk findings on ekg or echo  2. Paroxysmal atrial fibrillation Back  in atrial fibrillation with RVR this am Currently on Amio at 400mg  BID >>will give a dose of 150mg  IV Amio this am and see if he converts back to NSR No CCBs and BBs due to hx of bradycardia in NSR. On eliquis for stroke prevention  3. HTN BP controlled at 134/82mmHg Currently not on antihypertensive meds  4. Chronic diastolic dysfunction Appears euvolemic on exam  5. Deconditioning He was Awaiting discharge to CIR once bed is available but he feels that he has progressed and wants to go home with in home PT.  His sister is going to stay with him -awaiting to here for CIR  [name] MD, [facility] 03/03/2020 8:58 AM

## 2020-03-03 NOTE — Progress Notes (Signed)
Occupational Therapy Treatment Patient Details Name: Darryl Diaz MRN: [id number] DOB: 01-12-1941 Today's Date: 03/03/2020    History of present illness 79yo male presenting with history of syncope and A-fib with RVR. Note recent hospital admission 9/29-10/1 also for A-fib with RVR during which he received cardioversion. He now returns after syncopal episode which occurred in his car prior to leaving home. Received an additional cardioversion 10/18. 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 Patient's session encompassed functional mobility to complete household tasks and bathroom transfers. Pt remains heavily dependent on grab bars and RW to navigate bathroom, but is able to complete appropriate teach back for safety awareness throughout session. VSS stable for session. Discharge remains appropriate in order to increase overall functional independence, increase safety awareness, and increase overall balance and strength; will continue to follow acutely.     Follow Up Recommendations  CIR    Equipment Recommendations  None recommended by OT    Recommendations for Other Services Rehab consult    Precautions / Restrictions Precautions Precautions: Fall Precaution Comments: history of falls, impulsive, some shaking with intentional movements Restrictions Weight Bearing Restrictions: No       Mobility Bed Mobility Overal bed mobility: Needs Assistance Bed Mobility: Supine to Sit     Supine to sit: Min guard        Transfers Overall transfer level: Needs assistance Equipment used: Rolling walker (2 wheeled) Transfers: Sit to/from Stand Sit to Stand: Min guard         General transfer comment: min guard for safety, able to teach back proper hand placement  for power up and steadying    Balance Overall balance assessment: Needs assistance Sitting-balance support: Feet supported;Bilateral upper extremity supported Sitting balance-Leahy Scale: Good     Standing balance support: Bilateral upper extremity supported;During functional activity Standing balance-Leahy Scale: Poor Standing balance comment: heavy reliance on BUE support                           ADL either performed or assessed with clinical judgement   ADL Overall ADL's : Needs assistance/impaired                     Lower Body Dressing: Set up;Sitting/lateral leans Lower Body Dressing Details (indicate cue type and reason): donned slip on shoes Toilet Transfer: Minimal assistance;Ambulation;RW;Regular Toilet;Grab bars   Toileting- Clothing Manipulation and Hygiene: Min guard;Sit to/from stand Toileting - Clothing Manipulation Details (indicate cue type and reason): able to complete peri-care independently     Functional mobility during ADLs: Minimal assistance;Rolling walker General ADL Comments: pt motivated to go to the bathroom and ambulate to date, continues to make steady progress     Vision       Perception     Praxis      Cognition Arousal/Alertness: Awake/alert Behavior During Therapy: Impulsive Overall Cognitive Status: Impaired/Different from baseline Area of Impairment: Attention;Memory;Following commands;Safety/judgement;Awareness;Problem solving                   Current Attention Level: Selective;Alternating Memory: Decreased short-term memory Following Commands: Follows multi-step commands inconsistently Safety/Judgement: Decreased awareness of safety;Decreased awareness of deficits   Problem Solving: Difficulty sequencing;Requires verbal cues General Comments: Demonstrates impulsivity throughout, but is able  to verbalize safety needs with increased accuracy        Exercises     Shoulder Instructions        General Comments      Pertinent Vitals/ Pain       Pain Assessment: No/denies pain  Home Living                                          Prior Functioning/Environment              Frequency  Min 2X/week        Progress Toward Goals  OT Goals(current goals can now be found in the care plan section)  Progress towards OT goals: Progressing toward goals  Acute Rehab OT Goals Patient Stated Goal: get stronger and more independent and safer - go to Rehab OT Goal Formulation: With patient Time For Goal Achievement: 03/10/20 Potential to Achieve Goals: Good  Plan Discharge plan remains appropriate    Co-evaluation                 AM-PAC OT "6 Clicks" Daily Activity     Outcome Measure   Help from another person eating meals?: None Help from another person taking care of personal grooming?: A Little Help from another person toileting, which includes using toliet, bedpan, or urinal?: A Little Help from another person bathing (including washing, rinsing, drying)?: A Little Help from another person to put on and taking off regular upper body clothing?: A Little Help from another person to put on and taking off regular lower body clothing?: A Little 6 Click Score: 19    End of Session Equipment Utilized During Treatment: Gait belt;Rolling walker  OT Visit Diagnosis: Unsteadiness on feet (R26.81);Other abnormalities of gait and mobility (R26.89);Muscle weakness (generalized) (M62.81);History of falling (Z91.81);Other symptoms and signs involving cognitive function   Activity Tolerance Patient tolerated treatment well   Patient Left in chair;with call bell/phone within reach   Nurse Communication Mobility status        Time: 6962-9528 OT Time Calculation (min): 16 min  Charges: OT General Charges $OT Visit: 1 Visit OT Treatments $Self Care/Home Management : 8-22 mins  Corinne Ports E. Alanta Scobey, COTA/L Acute Rehabilitation  Services [place] 03/03/2020, 2:20 PM

## 2020-03-03 NOTE — Plan of Care (Signed)
  Problem: Education: Goal: Knowledge of General Education information will improve Description: Including pain rating scale, medication(s)/side effects and non-pharmacologic comfort measures Outcome: Progressing   Problem: Health Behavior/Discharge Planning: Goal: Ability to manage health-related needs will improve Outcome: Progressing   Problem: Clinical Measurements: Goal: Ability to maintain clinical measurements within normal limits will improve Outcome: Progressing   Problem: Clinical Measurements: Goal: Cardiovascular complication will be avoided Outcome: Progressing   Problem: Activity: Goal: Risk for activity intolerance will decrease Outcome: Progressing   Problem: Coping: Goal: Level of anxiety will decrease Outcome: Progressing   Problem: Pain Managment: Goal: General experience of comfort will improve Outcome: Progressing   Problem: Safety: Goal: Ability to remain free from injury will improve Outcome: Progressing

## 2020-03-03 NOTE — Plan of Care (Signed)

## 2020-03-04 ENCOUNTER — Other Ambulatory Visit: Payer: Self-pay | Admitting: *Deleted

## 2020-03-04 DIAGNOSIS — R55 Syncope and collapse: Secondary | ICD-10-CM | POA: Diagnosis not present

## 2020-03-04 DIAGNOSIS — I10 Essential (primary) hypertension: Secondary | ICD-10-CM | POA: Diagnosis not present

## 2020-03-04 DIAGNOSIS — I480 Paroxysmal atrial fibrillation: Secondary | ICD-10-CM | POA: Diagnosis not present

## 2020-03-04 NOTE — Progress Notes (Signed)
Progress Note   Subjective    Transfer to CIR on hold due to recurrent afib with RVR.  Currently in NSR but has been in and out of PAF.  Inpatient Medications    Scheduled Meds: . amiodarone  400 mg Oral BID  . apixaban  5 mg Oral BID  . atorvastatin  20 mg Oral Daily  . escitalopram  10 mg Oral Daily  . ferrous sulfate  325 mg Oral BID WC  . folic acid  1 mg Oral Daily  . loratadine  10 mg Oral Daily  . miconazole nitrate   Topical BID  . pantoprazole  40 mg Oral QAC breakfast  . sodium chloride flush  3 mL Intravenous Once  . vitamin B-1  250 mg Oral Daily   Continuous Infusions:  PRN Meds: acetaminophen, ipratropium, metoprolol tartrate, nitroGLYCERIN, ondansetron (ZOFRAN) IV   Vital Signs    Vitals:   03/03/20 1947 03/04/20 0020 03/04/20 0330 03/04/20 0701  BP: 128/85 138/85 140/85 (!) 143/81  Pulse: 80 80 82 78  Resp: 20 20 20 17   Temp: 98 F (36.7 C) 97.6 F (36.4 C) 97.9 F (36.6 C) 97.6 F (36.4 C)  TempSrc: Oral Oral Oral Oral  SpO2: 95% 91% 90% 94%  Weight:   103.8 kg   Height:        Intake/Output Summary (Last 24 hours) at 03/04/2020 1016 Last data filed at 03/04/2020 0330 Gross per 24 hour  Intake 240 ml  Output 1075 ml  Net -835 ml   Filed Weights   03/02/20 0435 03/03/20 0300 03/04/20 0330  Weight: 104.6 kg 104.4 kg 103.8 kg    Telemetry    NSR - Personally Reviewed  Physical Exam    GEN: Well nourished, well developed in no acute distress HEENT: Normal NECK: No JVD; No carotid bruits LYMPHATICS: No lymphadenopathy CARDIAC:RRR, no murmurs, rubs, gallops RESPIRATORY:  Clear to auscultation without rales, wheezing or rhonchi  ABDOMEN: Soft, non-tender, non-distended MUSCULOSKELETAL:  No edema; No deformity  SKIN: Warm and dry NEUROLOGIC:  Alert and oriented x 3 PSYCHIATRIC:  Normal affect     Labs    Chemistry Recent Labs  Lab 02/27/20 0020  NA 133*  K 5.2*  CL 104  CO2 22  GLUCOSE 118*  BUN 19  CREATININE 1.50*   CALCIUM 8.9  GFRNONAA 44*  ANIONGAP 7     Hematology Recent Labs  Lab 02/27/20 0020  WBC 12.5*  RBC 3.33*  HGB 10.5*  HCT 32.6*  MCV 97.9  MCH 31.5  MCHC 32.2  RDW 14.8  PLT 195     Patient ID  79 y.o. male w/ PMH of paroxysmal atrial fibrillation (bradycardiac arrest in 04/2019 while on Lopressor/Cardizem/Amiodarone/Gradpefruit juice, recent successful DCCV on 02/06/2020 with recurrence as an outpatient), HTN, HLD, GERD, OSA (intolerant to CPAP) who is currently admitted for evaluation of a syncopal episode.   Assessment & Plan    1.  Syncope -Likely due to bradycardia.  Medicines have been adjusted -he has not had any further dizziness or syncope -No high risk findings on ekg or echo  2. Paroxysmal atrial fibrillation -Back in atrial fibrillation with RVR yesterday and given IV Amio bolus in addition to PO Amio and now back in NSR -Currently on Amio at 400mg  BID which we will continue>>will take some time to load fully -No CCBs and BBs due to hx of bradycardia in NSR. -On eliquis for stroke prevention  3. HTN -BP controlled at 143/28mmHg today -  Currently not on antihypertensive meds  4. Chronic diastolic dysfunction -Appears euvolemic on exam  5. Deconditioning -He was Awaiting discharge to CIR >>unfortunately this was placed on hold due to going back into afib with RVR -hopefully will go to rehab Friday  [name] MD, [facility] [place] 03/04/2020 10:16 AM

## 2020-03-04 NOTE — Progress Notes (Signed)
Physical Therapy Treatment Patient Details Name: Darryl Diaz MRN: [id number] DOB: [birth year] Today's Date: 03/04/2020    History of Present Illness 79yo male presenting with history of syncope and A-fib with RVR. Note recent hospital admission 9/29-10/1 also for A-fib with RVR during which he received cardioversion. He now returns after syncopal episode which occurred in his car prior to leaving home. Received an additional cardioversion 10/18. 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 Pt continues to be limited in safe mobility by decreased balance especially dynamically. Pt with 2x LoB posteriorly with ambulation requiring modA for steadying. Pt also with 4/4 DoE requiring standing rest breaks, pt's balance also gets worse with increased dyspnea. Hard to get a good reading on pulse ox however SaO2 in 90%s when pleth is good. PT continues to recommend CIR level rehab due to combined impaired balance and dyspnea effect safety with gait. PT hopeful for d/c to CIR soon.    Follow Up Recommendations  CIR     Equipment Recommendations  Other (comment) (TBD at next venue)       Precautions / Restrictions Precautions Precautions: Fall Precaution Comments: history of falls, impulsive, some shaking with intentional movements    Mobility  Bed Mobility Overal bed mobility: Needs Assistance Bed Mobility: Supine to Sit     Supine to sit: Min guard     General bed mobility comments: increased effort to come to EoB  Transfers Overall transfer level: Needs assistance Equipment used: Rolling walker (2 wheeled) Transfers: Sit to/from Stand Sit to Stand: Min guard         General transfer comment: min guard for safety, pt with good power up  and steadying, however needs increased cuing for reaching back to chair and controlling his descent   Ambulation/Gait Ambulation/Gait assistance: Min assist;Mod assist Gait Distance (Feet): 50 Feet (requires 2x standing rest breaks due to DoE) Assistive device: Rolling walker (2 wheeled) Gait Pattern/deviations: Step-through pattern;Decreased step length - right;Decreased step length - left;Shuffle Gait velocity: slowed Gait velocity interpretation: <1.8 ft/sec, indicate of risk for recurrent falls General Gait Details: min A for steadying with 2x LoB posteriorly equiring Mod A for steadying, pt with poor pleth wave form with ambulation and despite 4/4 DoE when pleth is good SaO2 >90%       Balance Overall balance assessment: Needs assistance Sitting-balance support: Feet supported;Bilateral upper extremity supported Sitting balance-Leahy Scale: Fair     Standing balance support: Bilateral upper extremity supported;During functional activity Standing balance-Leahy Scale: Poor                              Cognition Arousal/Alertness: Awake/alert Behavior During Therapy: Anxious;Impulsive Overall Cognitive Status: Impaired/Different from baseline Area of Impairment: Attention;Memory;Following commands;Safety/judgement;Awareness;Problem solving                   Current Attention Level: Selective;Alternating Memory: Decreased short-term memory Following Commands: Follows multi-step commands inconsistently Safety/Judgement: Decreased awareness of safety;Decreased awareness of deficits   Problem Solving: Difficulty sequencing;Requires verbal cues General Comments: PT has seen pt at least 3 times and does not remember, continues to be confused about why he has not gone home despite education that he has  not been medically ready      Exercises Other Exercises Other Exercises: balance decreased BoS, 17 sec, 20 sec  Other Exercises: balance with eyes shut 3 sec  max     General Comments General comments (skin integrity, edema, etc.): HR max noted during ambulation 118bpm      Pertinent Vitals/Pain Pain Assessment: No/denies pain Faces Pain Scale: No hurt           PT Goals (current goals can now be found in the care plan section) Acute Rehab PT Goals Patient Stated Goal: get stronger and more independent and safer - go to Rehab PT Goal Formulation: With patient Time For Goal Achievement: 03/09/20 Potential to Achieve Goals: Fair Progress towards PT goals: Progressing toward goals    Frequency    Min 3X/week      PT Plan Current plan remains appropriate       AM-PAC PT "6 Clicks" Mobility   Outcome Measure  Help needed turning from your back to your side while in a flat bed without using bedrails?: None Help needed moving from lying on your back to sitting on the side of a flat bed without using bedrails?: None Help needed moving to and from a bed to a chair (including a wheelchair)?: A Little Help needed standing up from a chair using your arms (e.g., wheelchair or bedside chair)?: A Little Help needed to walk in hospital room?: A Little Help needed climbing 3-5 steps with a railing? : Total 6 Click Score: 18    End of Session Equipment Utilized During Treatment: Gait belt Activity Tolerance: Patient tolerated treatment well Patient left: in bed;with call bell/phone within reach Nurse Communication: Mobility status PT Visit Diagnosis: History of falling (Z91.81);Muscle weakness (generalized) (M62.81);Difficulty in walking, not elsewhere classified (R26.2);Pain;Unsteadiness on feet (R26.81);Repeated falls (R29.6)     Time: 8185-6314 PT Time Calculation (min) (ACUTE ONLY): 14 min  Charges:  $Gait Training: 8-22 mins                     Grove Defina B. Migdalia Dk PT, DPT Acute Rehabilitation Services Pager [phone] Office [phone]    [place] 03/04/2020, 4:47 PM

## 2020-03-04 NOTE — Plan of Care (Signed)
  Problem: Education: Goal: Knowledge of General Education information will improve Description: Including pain rating scale, medication(s)/side effects and non-pharmacologic comfort measures Outcome: Progressing   Problem: Health Behavior/Discharge Planning: Goal: Ability to manage health-related needs will improve Outcome: Progressing   Problem: Clinical Measurements: Goal: Ability to maintain clinical measurements within normal limits will improve Outcome: Progressing   Problem: Clinical Measurements: Goal: Diagnostic test results will improve Outcome: Progressing   Problem: Clinical Measurements: Goal: Cardiovascular complication will be avoided Outcome: Progressing   Problem: Activity: Goal: Risk for activity intolerance will decrease Outcome: Progressing   Problem: Nutrition: Goal: Adequate nutrition will be maintained Outcome: Progressing   Problem: Pain Managment: Goal: General experience of comfort will improve Outcome: Progressing   Problem: Safety: Goal: Ability to remain free from injury will improve Outcome: Progressing

## 2020-03-04 NOTE — Progress Notes (Signed)
Inpatient Rehab Admissions Coordinator:   Discussed with rehab MD who feels pt not quite medically stable for admit to CIR with multiple episodes of AF with RVR this admission.  Will follow up with pt tomorrow and Friday.   Shann Medal, PT, DPT Admissions Coordinator [phone] 03/04/20  10:11 AM

## 2020-03-04 NOTE — Plan of Care (Signed)
  Problem: Education: Goal: Knowledge of General Education information will improve Description: Including pain rating scale, medication(s)/side effects and non-pharmacologic comfort measures 03/04/2020 1257 by Theotis Barrio, RN Outcome: Progressing 03/04/2020 1257 by Theotis Barrio, RN Outcome: Progressing   Problem: Health Behavior/Discharge Planning: Goal: Ability to manage health-related needs will improve 03/04/2020 1257 by Theotis Barrio, RN Outcome: Progressing 03/04/2020 1257 by Theotis Barrio, RN Outcome: Progressing   Problem: Clinical Measurements: Goal: Ability to maintain clinical measurements within normal limits will improve 03/04/2020 1257 by Theotis Barrio, RN Outcome: Progressing 03/04/2020 1257 by Theotis Barrio, RN Outcome: Progressing Goal: Will remain free from infection 03/04/2020 1257 by Theotis Barrio, RN Outcome: Progressing 03/04/2020 1257 by Theotis Barrio, RN Outcome: Progressing Goal: Diagnostic test results will improve 03/04/2020 1257 by Theotis Barrio, RN Outcome: Progressing 03/04/2020 1257 by Theotis Barrio, RN Outcome: Progressing Goal: Respiratory complications will improve 03/04/2020 1257 by Theotis Barrio, RN Outcome: Progressing 03/04/2020 1257 by Theotis Barrio, RN Outcome: Progressing Goal: Cardiovascular complication will be avoided 03/04/2020 1257 by Theotis Barrio, RN Outcome: Progressing 03/04/2020 1257 by Theotis Barrio, RN Outcome: Progressing   Problem: Activity: Goal: Risk for activity intolerance will decrease 03/04/2020 1257 by Theotis Barrio, RN Outcome: Progressing 03/04/2020 1257 by Theotis Barrio, RN Outcome: Progressing   Problem: Nutrition: Goal: Adequate nutrition will be maintained 03/04/2020 1257 by Theotis Barrio, RN Outcome: Progressing 03/04/2020 1257 by Theotis Barrio, RN Outcome: Progressing   Problem: Coping: Goal: Level of anxiety will decrease 03/04/2020  1257 by Theotis Barrio, RN Outcome: Progressing 03/04/2020 1257 by Theotis Barrio, RN Outcome: Progressing   Problem: Elimination: Goal: Will not experience complications related to bowel motility 03/04/2020 1257 by Theotis Barrio, RN Outcome: Progressing 03/04/2020 1257 by Theotis Barrio, RN Outcome: Progressing Goal: Will not experience complications related to urinary retention 03/04/2020 1257 by Theotis Barrio, RN Outcome: Progressing 03/04/2020 1257 by Theotis Barrio, RN Outcome: Progressing   Problem: Pain Managment: Goal: General experience of comfort will improve 03/04/2020 1257 by Theotis Barrio, RN Outcome: Progressing 03/04/2020 1257 by Theotis Barrio, RN Outcome: Progressing   Problem: Safety: Goal: Ability to remain free from injury will improve 03/04/2020 1257 by Theotis Barrio, RN Outcome: Progressing 03/04/2020 1257 by Theotis Barrio, RN Outcome: Progressing   Problem: Skin Integrity: Goal: Risk for impaired skin integrity will decrease 03/04/2020 1257 by Theotis Barrio, RN Outcome: Progressing 03/04/2020 1257 by Theotis Barrio, RN Outcome: Progressing

## 2020-03-05 DIAGNOSIS — R55 Syncope and collapse: Secondary | ICD-10-CM | POA: Diagnosis not present

## 2020-03-05 DIAGNOSIS — I480 Paroxysmal atrial fibrillation: Secondary | ICD-10-CM | POA: Diagnosis not present

## 2020-03-05 DIAGNOSIS — I10 Essential (primary) hypertension: Secondary | ICD-10-CM | POA: Diagnosis not present

## 2020-03-05 DIAGNOSIS — R5381 Other malaise: Secondary | ICD-10-CM

## 2020-03-05 LAB — BASIC METABOLIC PANEL
Anion gap: 11 (ref 5–15)
BUN: 10 mg/dL (ref 8–23)
CO2: 22 mmol/L (ref 22–32)
Calcium: 8.6 mg/dL — ABNORMAL LOW (ref 8.9–10.3)
Chloride: 100 mmol/L (ref 98–111)
Creatinine, Ser: 1.09 mg/dL (ref 0.61–1.24)
GFR, Estimated: >60 mL/min (ref >60)
Glucose, Bld: 94 mg/dL (ref 70–99)
Potassium: 3.4 mmol/L — ABNORMAL LOW (ref 3.5–5.1)
Sodium: 133 mmol/L — ABNORMAL LOW (ref 135–145)

## 2020-03-05 LAB — MAGNESIUM
Magnesium: 1.3 mg/dL — ABNORMAL LOW (ref 1.7–2.4)
Magnesium: 1.5 mg/dL — ABNORMAL LOW (ref 1.7–2.4)

## 2020-03-05 MED ORDER — AMIODARONE HCL 200 MG PO TABS: 200.0000 mg | ORAL_TABLET | Freq: Every day | ORAL | 3 refills | Status: DC

## 2020-03-05 MED ORDER — FUROSEMIDE 40 MG PO TABS: 40.0000 mg | ORAL_TABLET | Freq: Every day | ORAL | 0 refills | Status: DC | PRN

## 2020-03-05 MED ORDER — AMIODARONE HCL 400 MG PO TABS: 400.0000 mg | ORAL_TABLET | Freq: Two times a day (BID) | ORAL | 0 refills | Status: DC

## 2020-03-05 MED ORDER — MAGNESIUM SULFATE 2 GM/50ML IV SOLN
2.0000 g | Freq: Once | INTRAVENOUS | Status: AC
  Administered 2020-03-05: 2 g via INTRAVENOUS
  Filled 2020-03-05: qty 50

## 2020-03-05 MED ORDER — MAGNESIUM SULFATE IN D5W 1-5 GM/100ML-% IV SOLN
1.0000 g | Freq: Once | INTRAVENOUS | Status: AC
  Administered 2020-03-05: 1 g via INTRAVENOUS
  Filled 2020-03-05: qty 100

## 2020-03-05 MED ORDER — POTASSIUM CHLORIDE CRYS ER 20 MEQ PO TBCR
40.0000 meq | EXTENDED_RELEASE_TABLET | Freq: Once | ORAL | Status: AC
  Administered 2020-03-05: 40 meq via ORAL
  Filled 2020-03-05: qty 2

## 2020-03-05 NOTE — Plan of Care (Signed)

## 2020-03-05 NOTE — TOC Initial Note (Signed)
Transition of Care [facility]) - Initial/Assessment Note    Patient Details  Name: Darryl Diaz MRN: [id number] Date of Birth: August 03, 1940  [facility] CM/SW Contact:    Zenon Mayo, RN Phone Number: 03/05/2020, 3:06 PM  Clinical Narrative:                 Patient for dc to CIR today.  Expected Discharge Plan: IP Rehab Facility Barriers to Discharge: No Barriers Identified   Patient Goals and CMS Choice        Expected Discharge Plan and Services Expected Discharge Plan: [place] In-house Referral: NA Discharge Planning Services: CM Consult   Living arrangements for the past 2 months: Single Family Home Expected Discharge Date: 03/05/20                 DME Agency: NA                  Prior Living Arrangements/Services Living arrangements for the past 2 months: Single Family Home Lives with:: Self Patient language and need for interpreter reviewed:: Yes Do you feel safe going back to the place where you live?: Yes      Need for Family Participation in Patient Care: No (Comment) Care giver support system in place?: No (comment)   Criminal Activity/Legal Involvement Pertinent to Current Situation/Hospitalization: No - Comment as needed  Activities of Daily Living Home Assistive Devices/Equipment: Environmental consultant (specify type), Wheelchair, Radio producer (specify quad or straight) (Rolator) ADL Screening (condition at time of admission) Patient's cognitive ability adequate to safely complete daily activities?: Yes Is the patient deaf or have difficulty hearing?: No Does the patient have difficulty seeing, even when wearing glasses/contacts?: No Does the patient have difficulty concentrating, remembering, or making decisions?: No Patient able to express need for assistance with ADLs?: Yes Does the patient have difficulty dressing or bathing?: No Independently performs ADLs?: No Communication: Independent Dressing (OT): Needs assistance Is this a  change from baseline?: Change from baseline, expected to last <3days Grooming: Independent Feeding: Independent Bathing: Needs assistance Is this a change from baseline?: Change from baseline, expected to last <3 days Toileting: Independent with device (comment) In/Out Bed: Needs assistance Is this a change from baseline?: Change from baseline, expected to last <3 days Walks in Home: Independent with device (comment) Does the patient have difficulty walking or climbing stairs?: Yes Weakness of Legs: Both Weakness of Arms/Hands: None  Permission Sought/Granted                  Emotional Assessment Appearance:: Appears stated age     Orientation: : Oriented to Self, Oriented to Place, Oriented to  Time, Oriented to Situation Alcohol / Substance Use: Not Applicable Psych Involvement: No (comment)  Admission diagnosis:  Syncope and collapse [R55] Atrial fibrillation with rapid ventricular response ([place]) [I48.91] Syncope, unspecified syncope type [R55] Patient Active Problem List   Diagnosis Date Noted  . Syncope and collapse 02/22/2020  . AKI (acute kidney injury) ([place]) 02/10/2020  . Unspecified protein-calorie malnutrition ([place]) 02/10/2020  . Weakness 02/06/2020  . Persistent atrial fibrillation with rapid ventricular response ([place]) 02/05/2020  . 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 Bradycardia 04/20/2019  . Atrial fibrillation with rapid ventricular response ([place])   . GI bleed 02/25/2019  . Fall 02/25/2019  . Bilateral leg edema 02/25/2019  . Hyponatremia 02/25/2019  . Acute posthemorrhagic anemia 08/11/2018  . Normochromic normocytic anemia 08/11/2018  . Hx of  adenomatous colonic polyps 08/10/2017  . PAF (paroxysmal atrial fibrillation) ([place]) 02/05/2017  . Dislocation of hip, posterior, left, closed ([place]) 04/30/2016  . Hip fracture ([place]) 03/30/2016  . Hypertension 03/30/2016  . Hyperlipidemia 03/30/2016  . GERD  (gastroesophageal reflux disease) 03/30/2016  . Osteoarthritis 03/30/2016  . Depression 03/30/2016  . Rhabdomyolysis 03/30/2016  . Leukocytosis 03/30/2016  . Pressure injury of skin 03/30/2016  . Closed fracture of left hip ([place])    PCP:  [place], [name], MD Pharmacy:   [facility] [address] ([address]), [place] [address] [place] ([place]) [place] [address] Phone: [phone] Fax: [phone]     Social Determinants of Health (SDOH) Interventions    Readmission Risk Interventions Readmission Risk Prevention Plan 03/05/2020 04/23/2019  Transportation Screening Complete Complete  PCP or Specialist Appt within 5-7 Days - Complete  Home Care Screening - Complete  Medication Review (RN CM) - Complete  HRI or Home Care Consult Complete -  Social Work Consult for [place] Planning/Counseling Complete -  Palliative Care Screening Not Applicable -  Medication Review (RN Care Manager) Complete -  Some recent data might be hidden

## 2020-03-05 NOTE — Progress Notes (Signed)
Inpatient Rehab Admissions Coordinator:   Discussed with rehab MD and pt remains in NSR.  Dr. Radford Pax in agreement for pt to admit to CIR today.  Will let pt/family and TOC team know.   Shann Medal, PT, DPT Admissions Coordinator [phone] 03/05/20  10:22 AM

## 2020-03-05 NOTE — Progress Notes (Signed)
PMR Admission Coordinator Pre-Admission Assessment  Patient: Darryl Diaz is an 79 y.o., male MRN: [id number] DOB: 02/06/1941 Height: 6' (182.9 cm) Weight: 101.3 kg  Insurance Information HMO:     PPO:      PCP:      IPA:      80/20:      OTHER:  PRIMARY: Medicare A and B      Policy#: [id number]      Subscriber: pt CM Name:       Phone#:      Fax#:  Pre-Cert#: verified online      Employer:  Benefits:  Phone #:      Name:  Eff. Date: 08/08/06 A and B     Deduct: $1484      Out of Pocket Max: n/a      Life Max: n/a CIR: 100%      SNF: 20 full days Outpatient: 80%     Co-Pay: 20% Home Health: 100%      Co-Pay:  DME: 80%     Co-Pay: 20% Providers:  SECONDARY: A[employer]      Policy#: [id number]     Phone#: [phone]  Financial Counselor:       Phone#:   The "Data Collection Information Summary" for patients in Inpatient Rehabilitation Facilities with attached "Privacy Act Statement-Health Care Records" was provided and verbally reviewed with: Patient  Emergency Contact Information Contact Information    Name Relation Home Work Mobile   [name],[name] Son [phone]  [phone]   [name],[name] Sister [phone]  [phone]   [name], [name] Daughter [phone]        Current Medical History  Patient Admitting Diagnosis: debility 2/2 recurrent AF with RVR, syncope  History of Present Illness: Darryl Diaz is a 79-year-old right-handed male with history of hypertension, hyperlipidemia, depression, tobacco abuse, ETOH, atrial fibrillation maintained on Eliquis and question medical compliance, bradycardia arrest 04/2019, GI bleed secondary to Mallory-Weiss tear, OSA not on CPAP.  Patient with recent admission 02/05/2020 to 02/07/2020 for A. fib with RVR, during which he received cardioversion, with hospital course complicated by electrolyte imbalance and pressure wounds, followed by wound care nurse, and he was discharged to a skilled nursing facility before returning home.   Patient did see his cardiologist 02/18/2020 for follow-up and again in atrial fibrillation with recommendations to begin flecainide and plan for DCCV 02/24/2020. Presented 02/22/2020 for bout of syncope in his car before going to a pre-procedure covid test.  On arrival to the ED heart rate elevated 110s-130, potassium 3.5, magnesium 1.9, creatinine 1.1, hemoglobin 11.2, platelets 295,000, BNP 266, EKG atrial fibrillation with rate 145.  Cardiology service follow-up is Toprol was discontinued.  A follow-up echocardiogram ejection fraction of 60 to 65% no wall motion abnormalities.  Patient again with episodes in and out of atrial fibrillation placed on amiodarone and adjusted, he remains on Eliquis.  He is tolerating a regular diet.  Therapies have been ongoing with slow gains.  Patient was recommended for CIR.    Patient's medical record from [facility] has been reviewed by the rehabilitation admission coordinator and physician.  Past Medical History  Past Medical History:  Diagnosis Date  . 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 Anemia   . Bradycardia    low 20's  . Cardiac arrest (HCC) 04/20/2019   12 min CPR with epi  . Dysrhythmia   . Fall at home 03/28/2016  . Fatty liver   . GERD (gastroesophageal   reflux disease)   . H/O seasonal allergies   . History of GI bleed   . Hx of adenomatous colonic polyps 08/10/2017  . Hyperlipidemia   . Hypertension   . Insomnia   . 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 MI (myocardial infarction) (HCC) 04/2019   had CPR by EMS  . OA (osteoarthritis)    hands  . OSA (obstructive sleep apnea)    No longer uses CPAP  . Persistent atrial fibrillation with rapid ventricular response (HCC) 02/05/2020    Family History   family history includes Arthritis in his mother; Heart disease (age of onset: 61) in his mother; Heart failure in his sister; Heart failure (age of onset: 84) in his father; Hypertension in  his mother; Stroke in his maternal aunt.  Prior Rehab/Hospitalizations Has the patient had prior rehab or hospitalizations prior to admission? Yes  Has the patient had major surgery during 100 days prior to admission? Yes   Current Medications  Current Facility-Administered Medications:  .  acetaminophen (TYLENOL) tablet 650 mg, 650 mg, Oral, Q4H PRN, Chandrasekhar, Mahesh A, MD, 650 mg at 02/23/20 0340 .  amiodarone (PACERONE) tablet 400 mg, 400 mg, Oral, BID, Pemberton, Heather E, MD, 400 mg at 03/05/20 0841 .  apixaban (ELIQUIS) tablet 5 mg, 5 mg, Oral, BID, Chandrasekhar, Mahesh A, MD, 5 mg at 03/05/20 0842 .  atorvastatin (LIPITOR) tablet 20 mg, 20 mg, Oral, Daily, Chandrasekhar, Mahesh A, MD, 20 mg at 03/05/20 0842 .  escitalopram (LEXAPRO) tablet 10 mg, 10 mg, Oral, Daily, Chandrasekhar, Mahesh A, MD, 10 mg at 03/05/20 0842 .  ferrous sulfate tablet 325 mg, 325 mg, Oral, BID WC, Chandrasekhar, Mahesh A, MD, 325 mg at 03/05/20 0842 .  folic acid (FOLVITE) tablet 1 mg, 1 mg, Oral, Daily, Chandrasekhar, Mahesh A, MD, 1 mg at 03/05/20 0841 .  ipratropium (ATROVENT) nebulizer solution 0.5 mg, 0.5 mg, Nebulization, Q6H PRN, Pemberton, Heather E, MD, 0.5 mg at 02/25/20 1751 .  loratadine (CLARITIN) tablet 10 mg, 10 mg, Oral, Daily, Chandrasekhar, Mahesh A, MD, 10 mg at 03/05/20 0841 .  metoprolol tartrate (LOPRESSOR) injection 2.5 mg, 2.5 mg, Intravenous, Q2H PRN, Barrett, Rhonda G, PA-C, 2.5 mg at 03/03/20 0709 .  miconazole nitrate (MICATIN) topical powder, , Topical, BID, Chandrasekhar, Mahesh A, MD, Given at 03/05/20 0846 .  nitroGLYCERIN (NITROSTAT) SL tablet 0.4 mg, 0.4 mg, Sublingual, Q5 Min x 3 PRN, Chandrasekhar, Mahesh A, MD .  ondansetron (ZOFRAN) injection 4 mg, 4 mg, Intravenous, Q6H PRN, Chandrasekhar, Mahesh A, MD .  pantoprazole (PROTONIX) EC tablet 40 mg, 40 mg, Oral, QAC breakfast, Chandrasekhar, Mahesh A, MD, 40 mg at 03/05/20 0842 .  sodium chloride flush (NS) 0.9 %  injection 3 mL, 3 mL, Intravenous, Once, Chandrasekhar, Mahesh A, MD .  thiamine tablet 250 mg, 250 mg, Oral, Daily, Chandrasekhar, Mahesh A, MD, 250 mg at 03/05/20 0841  Facility-Administered Medications Ordered in Other Encounters:  .  metoprolol tartrate (LOPRESSOR) injection, , , Anesthesia Intra-op, Beckner, Zachary S, CRNA, 5 mg at 02/27/19 1318  Patients Current Diet:  Diet Order            Diet Heart Room service appropriate? Yes; Fluid consistency: Thin  Diet effective now                 Precautions / Restrictions Precautions Precautions: Fall Precaution Comments: history of falls, impulsive, some shaking with intentional movements Restrictions Weight Bearing Restrictions: No   Has the patient   had 2 or more falls or a fall with injury in the past year? Yes  Prior Activity Level Limited Community (1-2x/wk): recent admission to SNF (discharged home "mod I" in early October); was still driving; no DME used in particular but has RW/rollator/w/c etc available  Prior Functional Level Self Care: Did the patient need help bathing, dressing, using the toilet or eating? Independent  Indoor Mobility: Did the patient need assistance with walking from room to room (with or without device)? Independent   Stairs: Did the patient need assistance with internal or external stairs (with or without device)? Independent  Functional Cognition: Did the patient need help planning regular tasks such as shopping or remembering to take medications? Independent  Home Assistive Devices / Equipment Home Assistive Devices/Equipment: Walker (specify type), Wheelchair, Cane (specify quad or straight) (Rolator) Home Equipment: Walker - 2 wheels, Walker - 4 wheels, Bedside commode, Wheelchair - manual, Shower seat - built in, Hand held shower head, Adaptive equipment  Prior Device Use: Indicate devices/aids used by the patient prior to current illness, exacerbation or injury? None of the above and  probably should have been, per his assessment.   Current Functional Level Cognition  Overall Cognitive Status: Impaired/Different from baseline Current Attention Level: Selective, Alternating Orientation Level: Oriented X4 Following Commands: Follows multi-step commands inconsistently Safety/Judgement: Decreased awareness of safety, Decreased awareness of deficits General Comments: PT has seen pt at least 3 times and does not remember, continues to be confused about why he has not gone home despite education that he has not been medically ready    Extremity Assessment (includes Sensation/Coordination)  Upper Extremity Assessment: Generalized weakness  Lower Extremity Assessment: Defer to PT evaluation    ADLs  Overall ADL's : Needs assistance/impaired Eating/Feeding: Independent, Sitting Grooming: Wash/dry hands, Standing, Min guard, Brushing hair Grooming Details (indicate cue type and reason): EOB Upper Body Bathing: Minimal assistance, Sitting Lower Body Bathing: Sitting/lateral leans, Sit to/from stand, Moderate assistance Upper Body Dressing : Minimal assistance, Sitting Lower Body Dressing: Set up, Sitting/lateral leans Lower Body Dressing Details (indicate cue type and reason): donned slip on shoes Toilet Transfer: Minimal assistance, Ambulation, RW, Regular Toilet, Grab bars Toilet Transfer Details (indicate cue type and reason): attempted to stand from EOB x4 before success with min A, unsafe hand placement and decreased safety awareness.  Toileting- Clothing Manipulation and Hygiene: Min guard, Sit to/from stand Toileting - Clothing Manipulation Details (indicate cue type and reason): able to complete peri-care independently Functional mobility during ADLs: Minimal assistance, Rolling walker General ADL Comments: pt motivated to go to the bathroom and ambulate to date, continues to make steady progress    Mobility  Overal bed mobility: Needs Assistance Bed Mobility:  Supine to Sit Supine to sit: Min guard Sit to supine: Min assist General bed mobility comments: increased effort to come to EoB    Transfers  Overall transfer level: Needs assistance Equipment used: Rolling walker (2 wheeled) Transfers: Sit to/from Stand Sit to Stand: Min guard Stand pivot transfers: Max assist General transfer comment: min guard for safety, pt with good power up and steadying, however needs increased cuing for reaching back to chair and controlling his descent     Ambulation / Gait / Stairs / Wheelchair Mobility  Ambulation/Gait Ambulation/Gait assistance: Min assist, Mod assist Gait Distance (Feet): 50 Feet (requires 2x standing rest breaks due to DoE) Assistive device: Rolling walker (2 wheeled) Gait Pattern/deviations: Step-through pattern, Decreased step length - right, Decreased step length - left, Shuffle General Gait Details:   min A for steadying with 2x LoB posteriorly equiring Mod A for steadying, pt with poor pleth wave form with ambulation and despite 4/4 DoE when pleth is good SaO2 >90% Gait velocity: slowed Gait velocity interpretation: <1.8 ft/sec, indicate of risk for recurrent falls    Posture / Balance Dynamic Sitting Balance Sitting balance - Comments: no LOB reaching feet at EOB Balance Overall balance assessment: Needs assistance Sitting-balance support: Feet supported, Bilateral upper extremity supported Sitting balance-Leahy Scale: Fair Sitting balance - Comments: no LOB reaching feet at EOB Standing balance support: Bilateral upper extremity supported, During functional activity Standing balance-Leahy Scale: Poor Standing balance comment: heavy reliance on BUE support    Special needs/care consideration Oxygen occasionally up to 2L in hospital   Previous Home Environment (from acute therapy documentation) Living Arrangements: Alone Available Help at Discharge: Family, Available PRN/intermittently, Neighbor (sister in [place] and she can  check on him) Type of Home: Other(Comment) (townhome) Home Layout: Two level, Able to live on main level with bedroom/bathroom, Full bath on main level Alternate Level Stairs-Number of Steps: does not go upstairs Home Access: Level entry Bathroom Shower/Tub: Walk-in shower Bathroom Toilet: Handicapped height Bathroom Accessibility: Yes How Accessible: Accessible via walker Home Care Services: No Additional Comments: pt drives and does his own grocery shopping. patient reports prior to COVID he was swimming 2-3x/week at [employer] and would like to get back to that.  Discharge Living Setting Plans for Discharge Living Setting: Patient's home Type of Home at Discharge: Apartment (townhome) Discharge Home Layout: Two level, Able to live on main level with bedroom/bathroom (apartment upstairs) Discharge Home Access: Level entry Discharge Bathroom Shower/Tub: Walk-in shower Discharge Bathroom Toilet: Standard Discharge Bathroom Accessibility: Yes How Accessible: Accessible via walker Does the patient have any problems obtaining your medications?: No  Social/Family/Support Systems Anticipated Caregiver: will have to hire caregivers, pt's children live out of town, and near by friends/family cannot provide 24/7 support Anticipated Caregiver's Contact Information: son ([name] Folmar) [phone] Ability/Limitations of Caregiver: lives out of state Caregiver Availability: Other (Comment) (will need to hire caregivers if needed 24/7) Discharge Plan Discussed with Primary Caregiver: Yes Is Caregiver In Agreement with Plan?: Yes Does Caregiver/Family have Issues with Lodging/Transportation while Pt is in Rehab?: No  Goals Patient/Family Goal for Rehab: PT/OT supervision to mod I, SLP n/a Expected length of stay: 7-10 days Cultural Considerations: n/a Additional Information: Pt motivated to return home, although will likely have 24/7 supervision recommendations from CIR.  Per son, pt has resources  for this.  He is also on the waiting list for 2 multi-level care facilities Pt/Family Agrees to Admission and willing to participate: Yes Program Orientation Provided & Reviewed with Pt/Caregiver Including Roles  & Responsibilities: Yes  Barriers to Discharge: Lack of/limited family support  Decrease burden of Care through IP rehab admission: n/a  Possible need for SNF placement upon discharge: Possibly  Patient Condition: I have reviewed medical records from [facility], spoken with CM, and patient and son. I met with patient at the bedside and discussed via phone for inpatient rehabilitation assessment.  Patient will benefit from ongoing PT and OT, can actively participate in 3 hours of therapy a day 5 days of the week, and can make measurable gains during the admission.  Patient will also benefit from the coordinated team approach during an Inpatient Acute Rehabilitation admission.  The patient will receive intensive therapy as well as Rehabilitation physician, nursing, social worker, and care management interventions.  Due to safety, disease management,   medication administration, pain management and patient education the patient requires 24 hour a day rehabilitation nursing.  The patient is currently min to mod assist with mobility and basic ADLs.  Discharge setting and therapy post discharge at home is anticipated.  Patient has agreed to participate in the Acute Inpatient Rehabilitation Program and will admit today.  Preadmission Screen Completed By: Giavonni Cizek E Hanford Lust, PT, DPT 03/05/2020 10:25 AM ______________________________________________________________________   Discussed status with Dr. Lovorn on 03/05/20  at 10:25 AM  and received approval for admission today.  Admission Coordinator:  Aasiya Creasey E Terianna Peggs, PT, DPT time 10:25 AM /Date 03/05/20    Assessment/Plan: Diagnosis: 1. Does the need for close, 24 hr/day Medical supervision in concert with the patient's rehab needs make it  unreasonable for this patient to be served in a less intensive setting? Yes 2. Co-Morbidities requiring supervision/potential complications: HTN, Afib with RVR, GI bleed, HLD, depression, tobacco/EtOH abuse 3. Due to bladder management, bowel management, safety, skin/wound care, disease management, medication administration, pain management and patient education, does the patient require 24 hr/day rehab nursing? Yes 4. Does the patient require coordinated care of a physician, rehab nurse, PT, OT, and SLP to address physical and functional deficits in the context of the above medical diagnosis(es)? Yes Addressing deficits in the following areas: balance, endurance, locomotion, strength, transferring, bathing, dressing, feeding, grooming and toileting 5. Can the patient actively participate in an intensive therapy program of at least 3 hrs of therapy 5 days a week? Yes 6. The potential for patient to make measurable gains while on inpatient rehab is good 7. Anticipated functional outcomes upon discharge from inpatient rehab: modified independent and supervision PT, modified independent and supervision OT, n/a SLP 8. Estimated rehab length of stay to reach the above functional goals is: 7-10 days 9. Anticipated discharge destination: Home 10. Overall Rehab/Functional Prognosis: good   MD Signature:  

## 2020-03-05 NOTE — Progress Notes (Signed)
Progress Note   Subjective    VERY anxious to go to rehab.  He is maintaining NSR on tele.  NO complaints.   Inpatient Medications    Scheduled Meds: . amiodarone  400 mg Oral BID  . apixaban  5 mg Oral BID  . atorvastatin  20 mg Oral Daily  . escitalopram  10 mg Oral Daily  . ferrous sulfate  325 mg Oral BID WC  . folic acid  1 mg Oral Daily  . loratadine  10 mg Oral Daily  . miconazole nitrate   Topical BID  . pantoprazole  40 mg Oral QAC breakfast  . sodium chloride flush  3 mL Intravenous Once  . vitamin B-1  250 mg Oral Daily   Continuous Infusions:  PRN Meds: acetaminophen, ipratropium, metoprolol tartrate, nitroGLYCERIN, ondansetron (ZOFRAN) IV   Vital Signs    Vitals:   03/04/20 2329 03/05/20 0324 03/05/20 0448 03/05/20 0744  BP: (!) 137/95 (!) 157/83  134/83  Pulse: 78 84  83  Resp: 20 20  19   Temp: 97.6 F (36.4 C) 97.9 F (36.6 C)  97.7 F (36.5 C)  TempSrc: Oral Oral  Oral  SpO2: 92% 95%  95%  Weight:   101.3 kg   Height:        Intake/Output Summary (Last 24 hours) at 03/05/2020 0900 Last data filed at 03/05/2020 4696 Gross per 24 hour  Intake 650 ml  Output 2100 ml  Net -1450 ml   Filed Weights   03/03/20 0300 03/04/20 0330 03/05/20 0448  Weight: 104.4 kg 103.8 kg 101.3 kg    Telemetry    NSR - Personally Reviewed  Physical Exam    GEN: Well nourished, well developed in no acute distress HEENT: Normal NECK: No JVD; No carotid bruits LYMPHATICS: No lymphadenopathy CARDIAC:RRR, no murmurs, rubs, gallops RESPIRATORY:  Clear to auscultation without rales, wheezing or rhonchi  ABDOMEN: Soft, non-tender, non-distended MUSCULOSKELETAL:  No edema; No deformity  SKIN: Warm and dry NEUROLOGIC:  Alert and oriented x 3 PSYCHIATRIC:  Normal affect     Labs    Chemistry Recent Labs  Lab 03/05/20 0033  NA 133*  K 3.4*  CL 100  CO2 22  GLUCOSE 94  BUN 10  CREATININE 1.09  CALCIUM 8.6*  GFRNONAA >60  ANIONGAP 11      Hematology No results for input(s): WBC, RBC, HGB, HCT, MCV, MCH, MCHC, RDW, PLT in the last 168 hours.   Patient ID  79 y.o. male w/ PMH of paroxysmal atrial fibrillation (bradycardiac arrest in 04/2019 while on Lopressor/Cardizem/Amiodarone/Gradpefruit juice, recent successful DCCV on 02/06/2020 with recurrence as an outpatient), HTN, HLD, GERD, OSA (intolerant to CPAP) who is currently admitted for evaluation of a syncopal episode.   Assessment & Plan    1.  Syncope -Likely due to bradycardia.  Medicines have been adjusted -he has not had any further dizziness or syncope -No high risk findings on ekg or echo  2. Paroxysmal atrial fibrillation -no further PAF on tele -continue Amio at 400mg  BID for another week then decrease to 200mg  daily -No CCBs and BBs due to hx of bradycardia in NSR. -On eliquis for stroke prevention  3. HTN -BP controlled at 134/45mmHg -Currently not on antihypertensive meds  4. Chronic diastolic dysfunction -Appears euvolemic on exam  5. Deconditioning -He was Awaiting discharge to CIR >>unfortunately this was placed on hold due to going back into afib with RVR -hopefully will go to rehab today  [name]  MD, [facility] 03/05/2020 9:00 AM

## 2020-03-05 NOTE — TOC Transition Note (Signed)
Transition of Care [facility]) - CM/SW Discharge Note   Patient Details  Name: Darryl Diaz MRN: [id number] Date of Birth: 09-Jul-1940  [facility] CM/SW Contact:  Zenon Mayo, RN Phone Number: 03/05/2020, 3:06 PM   Clinical Narrative:    DC to CIR   Final next level of care: IP Rehab Facility Barriers to Discharge: No Barriers Identified   Patient Goals and CMS Choice        Discharge Placement                       Discharge Plan and Services In-house Referral: NA Discharge Planning Services: CM Consult              DME Agency: NA                  Social Determinants of Health ([place]) Interventions     Readmission Risk Interventions Readmission Risk Prevention Plan 03/05/2020 04/23/2019  Transportation Screening Complete Complete  PCP or Specialist Appt within 5-7 Days - Complete  Home Care Screening - Complete  Medication Review (RN CM) - Complete  HRI or Home Care Consult Complete -  Social Work Consult for [place] Planning/Counseling Complete -  Palliative Care Screening Not Applicable -  Medication Review (RN Care Manager) Complete -  Some recent data might be hidden

## 2020-03-05 NOTE — Progress Notes (Signed)
Physical Therapy Treatment Patient Details Name: Darryl Diaz MRN: [id number] DOB: [birth year] Today's Date: 03/05/2020    History of Present Illness 79yo male presenting with history of syncope and A-fib with RVR. Note recent hospital admission 9/29-10/1 also for A-fib with RVR during which he received cardioversion. He now returns after syncopal episode which occurred in his car prior to leaving home. Received an additional cardioversion 10/18. 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  He is very motivated to perform PT this session.  Pt continues to benefit from skilled rehab in a post acute setting to improve, strength, endurance and balance.  Plan is for rehab today to CIR.     Follow Up Recommendations  CIR     Equipment Recommendations  Other (comment) (TBD at next venue)    Recommendations for Other Services       Precautions / Restrictions Precautions Precautions: Fall Precaution Comments: history of falls, impulsive, some shaking with intentional movements Restrictions Weight Bearing Restrictions: No    Mobility  Bed Mobility Overal bed mobility: Needs Assistance Bed Mobility: Supine to Sit     Supine to sit: Min guard Sit to supine: Supervision   General bed mobility comments: Pt able to move back to bed unassisted.  Transfers Overall transfer level: Needs assistance Equipment used: Rolling walker (2 wheeled) Transfers: Sit to/from Stand Sit to Stand: Min guard         General transfer comment: Cues for hand placement. improved eccentric load noted.  Ambulation/Gait Ambulation/Gait assistance: Min assist Gait Distance (Feet): 80 Feet Assistive device: Rolling walker (2 wheeled) Gait Pattern/deviations: Step-through pattern;Decreased step length -  right;Decreased step length - left;Shuffle Gait velocity: slowed   General Gait Details: Min assistance for turns and backing.  No LOB today but remains winded.  Post gt SPO2 95% on RA.  DOE 3/4.   Stairs             Wheelchair Mobility    Modified Rankin (Stroke Patients Only)       Balance     Sitting balance-Leahy Scale: Fair       Standing balance-Leahy Scale: Poor Standing balance comment: heavy reliance on BUE support                            Cognition Arousal/Alertness: Awake/alert Behavior During Therapy: Anxious;Impulsive Overall Cognitive Status: Impaired/Different from baseline Area of Impairment: Attention;Memory;Following commands;Safety/judgement;Awareness;Problem solving                   Current Attention Level: Selective;Alternating Memory: Decreased short-term memory Following Commands: Follows multi-step commands inconsistently Safety/Judgement: Decreased awareness of safety;Decreased awareness of deficits            Exercises General Exercises - Lower Extremity Long Arc Quad: AROM;Both;10 reps Hip Flexion/Marching: AROM;Both;10 reps    General Comments        Pertinent Vitals/Pain Pain Assessment: No/denies pain    Home Living                      Prior Function            PT Goals (current goals can now be found in the care plan section) Acute Rehab PT Goals Patient  Stated Goal: Go to rehab. Potential to Achieve Goals: Fair Progress towards PT goals: Progressing toward goals    Frequency    Min 3X/week      PT Plan Current plan remains appropriate    Co-evaluation              AM-PAC PT "6 Clicks" Mobility   Outcome Measure  Help needed turning from your back to your side while in a flat bed without using bedrails?: None Help needed moving from lying on your back to sitting on the side of a flat bed without using bedrails?: None Help needed moving to and from a bed to a  chair (including a wheelchair)?: A Little Help needed standing up from a chair using your arms (e.g., wheelchair or bedside chair)?: A Little Help needed to walk in hospital room?: A Little Help needed climbing 3-5 steps with a railing? : A Lot 6 Click Score: 19    End of Session Equipment Utilized During Treatment: Gait belt Activity Tolerance: Patient tolerated treatment well Patient left: in bed;with call bell/phone within reach;with bed alarm set Nurse Communication: Mobility status PT Visit Diagnosis: History of falling (Z91.81);Muscle weakness (generalized) (M62.81);Difficulty in walking, not elsewhere classified (R26.2);Pain;Unsteadiness on feet (R26.81);Repeated falls (R29.6) Pain - Right/Left: Right Pain - part of body: Leg     Time: 8657-8469 PT Time Calculation (min) (ACUTE ONLY): 21 min  Charges:  $Gait Training: 8-22 mins                     Darryl Diaz , PTA Acute Rehabilitation Services Pager [phone] Office [phone]     Darryl Diaz Eli Hose 03/05/2020, 3:23 PM

## 2020-03-05 NOTE — Progress Notes (Addendum)
Pt with order for discharge to CIR. Report given to [facility]. V/S stable. HR stays 90'2-110's on Afib. IV removed. No c/o of any discomfort or pain. Discahrged to CIR without complication.

## 2020-03-05 NOTE — Discharge Summary (Signed)
Discharge Summary    Patient ID: Darryl Diaz MRN: [id number]; DOB: 09-26-1940  Admit date: 02/22/2020 Discharge date: 03/05/2020  Primary Care Provider: [name], MD  Primary Cardiologist: Sherren Mocha, MD  Primary Electrophysiologist:  Virl Axe, MD   Discharge Diagnoses    Active Problems:   Syncope and collapse Persistent atrial fibrilaltion  HTN  Bradycardia while in sinus  Deconditioning Gait imbalance  Diagnostic Studies/Procedures    Stress test 02/23/2020  There was no ST segment deviation noted during stress.  The study is normal.  This is a low risk study.  The left ventricular ejection fraction is normal (55-65%).  Echo 02/23/2020 1. Left ventricular ejection fraction, by estimation, is 60 to 65%. The  left ventricle has normal function. The left ventricle has no regional  wall motion abnormalities. Left ventricular diastolic function could not  be evaluated.  2. Apical RV hyperkinesis. Right ventricular systolic function is normal.  The right ventricular size is mildly enlarged.  3. Left atrial size was mildly dilated.  4. The mitral valve is grossly normal. Trivial mitral valve  regurgitation.  5. The aortic valve was not well visualized. Aortic valve regurgitation  is not visualized.  6. There is dilatation of the aortic root, measuring 42 mm. There is  dilatation of the ascending aorta, measuring 39 mm.  7. The inferior vena cava is dilated in size with <50% respiratory  variability, suggesting right atrial pressure of 15 mmHg.   Comparison(s): A prior study was performed on 02/06/20. Compared to prior,  McConnell's Sign redemonstrated. Stable Aortic Root and Ascending Aorta  measurements. IVC dilation increased from prior. Less MR demonstrated in  this study.    History of Present Illness     Darryl Diaz is a 79 y.o. male with malew/ PMH of paroxysmal atrial fibrillation (bradycardiac arrest in 04/2019 while on  Lopressor/Cardizem/Amiodarone/Gradpefruit juice, recent successful DCCV on 02/06/2020 with recurrence as an outpatient), HTN, HLD, GERD, and OSA (intolerant to CPAP) admitted with syncope.   Admitted on 02/05/2020 with weakness and fatigue and found to have atrial fibrillation with RVR. Started on IV cardizem.  Compliant with Eliquis. Underwent successful cardioversion. Maintained sinus rhythm. Echo showed normal LVEF.    Seen in clinic 02/18/2020. Again back in afib with persistent fatigue. Discussed with Dr. Burt Knack and Dr. Caryl Comes and placed on Flecainide 100mg  BID and place was for cardioversion on 02/24/20.  However presented 03/24/2020 with syncope while sitting in his car. No prodromal symptoms. EKG on arrival with afib with RVR but non-ischemic. Did not started Flecainide as recommended. Admitted for further work up.   Hospital Course     Consultants: None  1.Syncope/ Bradycardia -  DDimer negative this admission. No chest pain, SOB, or DOE complaints. He did not start flecainide as recently recommended. Symptoms c/f cardiogenic syncope. Negative ischemic work-up (stress test). Concern for ? Unstable afib with RVR now back in NSR. S/p DCCV 10/18 now back in sinus rhythm. Seen by Dr. Rayann Heman for round over weekend (no EP consult) who  Felt syncope likely due to bradycardia (discontinued BB and CCB). No further dizziness. 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   2. Persistent atrial fibrillation:  -S/p successful repeat DCCV on 10/17. However recurrence on 02/26/2020 and started on Amiodarone bolus and drip. Converted back to NSR on 10/21 and switched to PO Amiodarone 400mg  BID. - Continue Amio at 400mg  BID for another week then decrease to 200mg  daily - No CCBs  and BBs due to hx of bradycardia in NSR. - On Eliquis for anticoagulation  - Echo this admission showed LVEF of 60-65% and normal RV function.   3. Chronic diastolic CHF: - Has volume  overload with tachycardia - PRN lasix  4. HTN: - BP stable - off antihypertensive  5. Gait instability/Deconditioning  - RN notes unsteadiness when getting out of bed.  - Per PT, recommend CIR  - Appreciate PT/OT recommendations  - Dc to in-patient rehab evaluation  Did the patient have an acute coronary syndrome (MI, NSTEMI, STEMI, etc) this admission?:  No                               Did the patient have a percutaneous coronary intervention (stent / angioplasty)?:  No.   _____________  Discharge Vitals Blood pressure 115/63, pulse 69, temperature 98.1 F (36.7 C), temperature source Oral, resp. rate 19, height 6' (1.829 m), weight 101.3 kg, SpO2 93 %.  Filed Weights   03/03/20 0300 03/04/20 0330 03/05/20 0448  Weight: 104.4 kg 103.8 kg 101.3 kg    Labs & Radiologic Studies    Basic Metabolic Panel Recent Labs    03/05/20 0033  NA 133*  K 3.4*  CL 100  CO2 22  GLUCOSE 94  BUN 10  CREATININE 1.09  CALCIUM 8.6*  MG 1.3*   High Sensitivity Troponin:   Recent Labs  Lab 02/06/20 0916 02/22/20 1433 02/22/20 1532  TROPONINIHS 20* 5 5    _____________  CT Head Wo Contrast  Result Date: 02/05/2020 CLINICAL DATA:  Head trauma.  Fall, dizzy on anticoagulation. EXAM: CT HEAD WITHOUT CONTRAST TECHNIQUE: Contiguous axial images were obtained from the base of the skull through the vertex without intravenous contrast. COMPARISON:  02/25/2019 FINDINGS: Brain: No evidence of acute large vascular territory infarction, hemorrhage, hydrocephalus, extra-axial collection or mass lesion/mass effect. Similar scattered white matter hypoattenuation, compatible with chronic microvascular ischemic disease. Similar diffuse cerebral volume loss with ex vacuo ventricular dilation. Vascular: Calcific intracranial atherosclerosis. Skull: Normal. Negative for fracture or focal lesion. Sinuses/Orbits: Mild scattered ethmoid air cell mucosal thickening and trace inferior left maxillary sinus  mucosal thickening. Otherwise, the sinuses are clear. No evidence of acute orbital abnormality. Other: Underpneumatized left mastoid air cells with chronic trace left mastoid effusion. IMPRESSION: 1. No evidence of acute intracranial abnormality. 2. Similar chronic microvascular ischemic disease and generalized cerebral atrophy. Electronically Signed   By: Margaretha Sheffield MD   On: 02/05/2020 14:56   CT Cervical Spine Wo Contrast  Result Date: 02/05/2020 CLINICAL DATA:  Neck trauma.  Fall. EXAM: CT CERVICAL SPINE WITHOUT CONTRAST TECHNIQUE: Multidetector CT imaging of the cervical spine was performed without intravenous contrast. Multiplanar CT image reconstructions were also generated. COMPARISON:  None. FINDINGS: Alignment: Mild anterolisthesis of C4 on C5, favor degenerative given severe facet degenerative change at this level. Reversal of the normal cervical lordosis, favored positional/degenerative in etiology. Otherwise, no substantial subluxation. Skull base and vertebrae: No acute fracture. Vertebral body heights are maintained. No primary bone lesion or focal pathologic process. Soft tissues and spinal canal: No prevertebral fluid or swelling. No visible large canal hematoma. Disc levels: Ankylosis across the left C3-C4 and right C5-C6 facet joints. 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 Upper chest: Negative. Please see separately dictated CT of the head for intracranial valuation. IMPRESSION: 1.  No evidence of acute fracture or traumatic malalignment. 2. 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 Electronically Signed   By: Margaretha Sheffield MD   On: 02/05/2020 15:04   NM Myocar Multi W/Spect Tamela Oddi Motion / EF  Result Date: 02/23/2020  There was no ST segment deviation noted during stress.  The study is normal.   This is a low risk study.  The left ventricular ejection fraction is normal (55-65%).    DG Chest Portable 1 View  Result Date: 02/22/2020 CLINICAL DATA:  Syncope EXAM: PORTABLE CHEST 1 VIEW COMPARISON:  February 05, 2020 FINDINGS: Lungs are clear. Heart is borderline enlarged with pulmonary vascularity normal. No adenopathy. No bone lesions. IMPRESSION: Borderline cardiac enlargement.  Lungs clear. Electronically Signed   By: Lowella Grip III M.D.   On: 02/22/2020 13:51   DG Chest Port 1 View  Result Date: 02/05/2020 CLINICAL DATA:  Atrial fibrillation, presenting after mechanical fall. EXAM: PORTABLE CHEST 1 VIEW COMPARISON:  Comparison chest x-ray 04/22/2019 FINDINGS: The heart size and mediastinal contours are sign unchanged. No focal consolidation. No pulmonary edema. No pleural effusion. No pneumothorax. No acute displaced fracture. IMPRESSION: No active disease. Electronically Signed   By: Iven Finn M.D.   On: 02/05/2020 17:16   ECHOCARDIOGRAM COMPLETE  Result Date: 02/06/2020    ECHOCARDIOGRAM REPORT   Patient Name:   Darryl Diaz Date of Exam: 02/06/2020 Medical Rec #:  [id number]        Height:       72.0 in Accession #:    [id number]       Weight:       231.0 lb Date of Birth:  1940/12/04        BSA:          2.265 m Patient Age:    63 years         BP:           119/81 mmHg Patient Gender: M                HR:           105 bpm. Exam Location:  Inpatient Procedure: 2D Echo, Cardiac Doppler and Color Doppler Indications:    ; I48.91* Unspeicified atrial fibrillation  History:        Patient has prior history of Echocardiogram examinations, most                 recent 04/21/2019. Abnormal ECG, Arrythmias:Cardiac Arrest and                 Atrial Fibrillation; Risk Factors:Hypertension and Dyslipidemia.  Sonographer:    Roseanna Rainbow RDCS Referring Phys: [id number] [place]  1. Left ventricular ejection fraction, by estimation, is 60 to 65%. The left ventricle  has normal function. The left ventricle has no regional wall motion abnormalities. Left ventricular diastolic parameters are indeterminate.  2. There is mild hyperkinesis of the RV apex relative to the RV free wall. Recommend clinical correlation if concern for underlying pulmonary embolism.     . Right ventricular systolic function is low normal. The right ventricular size is mildly enlarged. There is mildly elevated pulmonary artery systolic pressure.  3. Left atrial size was mildly dilated.  4. The mitral valve is normal in structure. Suspect at least mild, eccentric mitral regurgitation secondary to tethering of the posterior mitral leaflet. mitral valve regurgitation.  5. The aortic valve is tricuspid.  There is mild calcification of the aortic valve. There is moderate thickening of the aortic valve. Aortic valve regurgitation is not visualized. Mild to moderate aortic valve sclerosis/calcification is present, without any evidence of aortic stenosis.  6. Aortic dilatation noted. There is mild dilatation of the ascending aorta, measuring 40 mm. There is mild dilatation of the aortic root, measuring 39 mm.  7. The inferior vena cava is dilated in size with <50% respiratory variability, suggesting right atrial pressure of 15 mmHg. Comparison(s): In comparison to prior TTE in 02/26/19, there is mild hyperkinesis of the RV apex relative to the RV free wall. Discussed with primary team if there is concern for pulmonary embolism. Otherwise, no significant changes from prior. FINDINGS  Left Ventricle: Left ventricular ejection fraction, by estimation, is 60 to 65%. The left ventricle has normal function. The left ventricle has no regional wall motion abnormalities. The left ventricular internal cavity size was normal in size. There is  no left ventricular hypertrophy. Left ventricular diastolic parameters are indeterminate. Right Ventricle: There is mild hyperkinesis of the RV apex relative to the RV free wall.  Recommend clinical correlation if concern for underlying pulmonary embolism. The right ventricular size is mildly enlarged. No increase in right ventricular wall thickness. Right ventricular systolic function is low normal. There is mildly elevated pulmonary artery systolic pressure. The tricuspid regurgitant velocity is 2.67 m/s, and with an assumed right atrial pressure of 8 mmHg, the estimated right ventricular systolic pressure is 54.2 mmHg. Left Atrium: Left atrial size was mildly dilated. Right Atrium: Right atrial size was normal in size. Pericardium: There is no evidence of pericardial effusion. Mitral Valve: The mitral valve is normal in structure. There is mild thickening of the mitral valve leaflet(s). There is mild calcification of the mitral valve leaflet(s). Mild mitral annular calcification. Suspect at least mild, eccentric mitral regurgitation secondary to tethering of the posterior mitral leaflet. MV peak gradient, 14.1 mmHg. The mean mitral valve gradient is 4.0 mmHg. Tricuspid Valve: The tricuspid valve is normal in structure. Tricuspid valve regurgitation is trivial. Aortic Valve: The aortic valve is tricuspid. There is mild calcification of the aortic valve. There is moderate thickening of the aortic valve. There is mild aortic valve annular calcification. Aortic valve regurgitation is not visualized. Mild to moderate aortic valve sclerosis/calcification is present, without any evidence of aortic stenosis. Pulmonic Valve: The pulmonic valve was grossly normal. Pulmonic valve regurgitation is trivial. Aorta: Aortic dilatation noted. There is mild dilatation of the ascending aorta, measuring 40 mm. There is mild dilatation of the aortic root, measuring 39 mm. Venous: The inferior vena cava is dilated in size with less than 50% respiratory variability, suggesting right atrial pressure of 15 mmHg. IAS/Shunts: No atrial level shunt detected by color flow Doppler.  LEFT VENTRICLE PLAX 2D LVIDd:          4.10 cm LVIDs:         3.30 cm LV PW:         1.60 cm LV IVS:        1.30 cm LVOT diam:     2.10 cm LV SV:         66 LV SV Index:   29 LVOT Area:     3.46 cm  LV Volumes (MOD) LV vol d, MOD A2C: 85.6 ml LV vol d, MOD A4C: 85.4 ml LV vol s, MOD A2C: 33.5 ml LV vol s, MOD A4C: 32.9 ml LV SV MOD A2C:     52.1  ml LV SV MOD A4C:     85.4 ml LV SV MOD BP:      52.5 ml RIGHT VENTRICLE             IVC RV S prime:     16.60 cm/s  IVC diam: 3.10 cm TAPSE (M-mode): 1.5 cm LEFT ATRIUM             Index       RIGHT ATRIUM           Index LA diam:        4.50 cm 1.99 cm/m  RA Area:     17.30 cm LA Vol (A2C):   75.5 ml 33.33 ml/m RA Volume:   41.10 ml  18.14 ml/m LA Vol (A4C):   47.7 ml 21.06 ml/m LA Biplane Vol: 60.0 ml 26.49 ml/m  AORTIC VALVE LVOT Vmax:   112.00 cm/s LVOT Vmean:  78.100 cm/s LVOT VTI:    0.191 m  AORTA Ao Root diam: 4.00 cm Ao Asc diam:  4.00 cm MITRAL VALVE                 TRICUSPID VALVE MV Area (PHT): 4.33 cm      TR Peak grad:   28.5 mmHg MV Peak grad:  14.1 mmHg     TR Vmax:        267.00 cm/s MV Mean grad:  4.0 mmHg MV Vmax:       1.88 m/s      SHUNTS MV Vmean:      75.8 cm/s     Systemic VTI:  0.19 m MV Decel Time: 175 msec      Systemic Diam: 2.10 cm MR Peak grad:    114.1 mmHg MR Mean grad:    64.0 mmHg MR Vmax:         534.00 cm/s MR Vmean:        354.0 cm/s MR PISA:         1.01 cm MR PISA Eff ROA: 7 mm MR PISA Radius:  0.40 cm MV E velocity: 131.33 cm/s Gwyndolyn Kaufman MD Electronically signed by Gwyndolyn Kaufman MD Signature Date/Time: 02/06/2020/12:32:56 PM    Final    ECHOCARDIOGRAM LIMITED  Result Date: 02/25/2020    ECHOCARDIOGRAM LIMITED REPORT   Patient Name:   Darryl Diaz Date of Exam: 02/23/2020 Medical Rec #:  [id number]        Height:       72.0 in Accession #:    [id number]       Weight:       235.0 lb Date of Birth:  Sep 02, 1940        BSA:          2.282 m Patient Age:    5 years         BP:           132/85 mmHg Patient Gender: M                HR:           117  bpm. Exam Location:  Inpatient Procedure: Limited Echo, Cardiac Doppler, Color Doppler and Intracardiac            Opacification Agent Indications:    Syncope  History:        Patient has prior history of Echocardiogram examinations, most                 recent 02/06/2020. Arrythmias:Atrial Fibrillation,  Signs/Symptoms:Syncope; Risk Factors:Hypertension, Dyslipidemia                 and Former Smoker. GERD.  Sonographer:    Clayton Lefort RDCS (AE) Referring Phys: [id number] [place]  1. Left ventricular ejection fraction, by estimation, is 60 to 65%. The left ventricle has normal function. The left ventricle has no regional wall motion abnormalities. Left ventricular diastolic function could not be evaluated.  2. Apical RV hyperkinesis. Right ventricular systolic function is normal. The right ventricular size is mildly enlarged.  3. Left atrial size was mildly dilated.  4. The mitral valve is grossly normal. Trivial mitral valve regurgitation.  5. The aortic valve was not well visualized. Aortic valve regurgitation is not visualized.  6. There is dilatation of the aortic root, measuring 42 mm. There is dilatation of the ascending aorta, measuring 39 mm.  7. The inferior vena cava is dilated in size with <50% respiratory variability, suggesting right atrial pressure of 15 mmHg. Comparison(s): A prior study was performed on 02/06/20. Compared to prior, McConnell's Sign redemonstrated. Stable Aortic Root and Ascending Aorta measurements. IVC dilation increased from prior. Less MR demonstrated in this study. FINDINGS  Left Ventricle: Left ventricular ejection fraction, by estimation, is 60 to 65%. The left ventricle has normal function. The left ventricle has no regional wall motion abnormalities. Definity contrast agent was given IV to delineate the left ventricular  endocardial borders. Left ventricular diastolic function could not be evaluated. Right Ventricle: Apical RV hyperkinesis. The  right ventricular size is mildly enlarged. Right ventricular systolic function is normal. Left Atrium: Left atrial size was mildly dilated. Right Atrium: Right atrial size was normal in size. Pericardium: There is no evidence of pericardial effusion. Mitral Valve: The mitral valve is grossly normal. Trivial mitral valve regurgitation. Tricuspid Valve: The tricuspid valve is not well visualized. Tricuspid valve regurgitation is not demonstrated. Aortic Valve: The aortic valve was not well visualized. Aortic valve regurgitation is not visualized. Pulmonic Valve: The pulmonic valve was not well visualized. Pulmonic valve regurgitation is not visualized. Aorta: Index to height squared only 2.16. There is dilatation of the aortic root, measuring 42 mm. There is dilatation of the ascending aorta, measuring 39 mm. Venous: The inferior vena cava is dilated in size with less than 50% respiratory variability, suggesting right atrial pressure of 15 mmHg. IAS/Shunts: The atrial septum is grossly normal. LEFT VENTRICLE PLAX 2D LVOT diam:     2.20 cm LVOT Area:     3.80 cm  IVC IVC diam: 2.80 cm  AORTA Ao Root diam: 4.20 cm Ao Asc diam:  3.90 cm  SHUNTS Systemic Diam: 2.20 cm Rudean Haskell MD Electronically signed by Rudean Haskell MD Signature Date/Time: 02/25/2020/11:47:53 AM    Final    Disposition   Pt is being discharged home today in good condition.  Follow-up Plans & Appointments     Follow-up Information    Fenton, Clyde Hill R, PA. Go on 03/20/2020.   Specialty: Cardiology Why: @1 :30pm for afib follow up in afib clinic Contact information: [place] [id number] [phone]              Discharge Instructions    Diet - low sodium heart healthy   Complete by: As directed    Increase activity slowly   Complete by: As directed       Discharge Medications   Allergies as of 03/05/2020      Reactions   Penicillins Other (See Comments)  Tolerated Cephalosporin Date:  10/15/19. Allergy is from childhood Did it involve swelling of the face/tongue/throat, SOB, or low BP? Unknown Did it involve sudden or severe rash/hives, skin peeling, or any reaction on the inside of your mouth or nose? Unknown Did you need to seek medical attention at a hospital or doctor's office? Unknown When did it last happen?childhood reaction If all above answers are "NO", may proceed with cephalosporin use.      Medication List    STOP taking these medications   flecainide 100 MG tablet Commonly known as: TAMBOCOR   magnesium oxide 400 (241.3 Mg) MG tablet Commonly known as: MAG-OX   metoprolol succinate 25 MG 24 hr tablet Commonly known as: TOPROL-XL   metoprolol succinate 50 MG 24 hr tablet Commonly known as: TOPROL-XL   potassium chloride SA 20 MEQ tablet Commonly known as: KLOR-CON     TAKE these medications   amiodarone 400 MG tablet Commonly known as: PACERONE Take 1 tablet (400 mg total) by mouth 2 (two) times daily for 7 days.   amiodarone 200 MG tablet Commonly known as: Pacerone Take 1 tablet (200 mg total) by mouth daily. Start taking on: March 13, 2020   apixaban 5 MG Tabs tablet Commonly known as: Eliquis Take 1 tablet (5 mg total) by mouth 2 (two) times daily.   atorvastatin 20 MG tablet Commonly known as: LIPITOR Take 1 tablet (20 mg total) by mouth daily.   Centrum Silver 50+Men Tabs Take 1 tablet by mouth daily with breakfast.   escitalopram 10 MG tablet Commonly known as: LEXAPRO Take 1 tablet (10 mg total) by mouth daily.   ferrous sulfate 325 (65 FE) MG tablet Take 1 tablet (325 mg total) by mouth 2 (two) times daily with a meal.   folic acid 1 MG tablet Commonly known as: FOLVITE Take 1 tablet (1 mg total) by mouth daily.   furosemide 40 MG tablet Commonly known as: LASIX Take 1 tablet (40 mg total) by mouth daily as needed for fluid (weight gain). What changed: reasons to take this   loratadine 10 MG  tablet Commonly known as: CLARITIN Take 1 tablet (10 mg total) by mouth daily.   pantoprazole 40 MG tablet Commonly known as: PROTONIX Take 1 tablet (40 mg total) by mouth daily. What changed: when to take this   tamsulosin 0.4 MG Caps capsule Commonly known as: FLOMAX Take 0.4 mg by mouth every evening.   traZODone 50 MG tablet Commonly known as: DESYREL Take 1 tablet (50 mg total) by mouth at bedtime.   vitamin B-1 250 MG tablet Take 1 tablet (250 mg total) by mouth daily.          Outstanding Labs/Studies   BMET daily/every few days at [facility]  Duration of Discharge Encounter   Greater than 30 minutes including physician time.  Jarrett Soho, PA 03/05/2020, 12:54 PM

## 2020-03-05 NOTE — H&P (Signed)
Physical Medicine and Rehabilitation Admission H&P    Chief Complaint  Patient presents with  . Loss of Consciousness  : HPI: Darryl Diaz is a 79 year old right-handed male with history of hypertension, hyperlipidemia, depression, tobacco abuse, atrial fibrillation maintained on Eliquis and question medical compliance, bradycardia arrest 04/2019, GI bleed secondary to Mallory-Weiss tear, OSA not on CPAP.  Patient with recent admission 02/05/2020 to 02/07/2020 for A. fib with RVR during which she received cardioversion with hospital course complicated by electrolyte imbalance and pressure wounds followed by wound care nurse and he was discharged to a skilled nursing facility before returning home.  Per chart review patient lives alone independent with assistive device.  Two-level home bed and bath on main level.  Patient does drive and does his own grocery shopping.  He does have family that check on him routinely.  Patient did see his cardiologist 02/18/2020 and follow-up and again in atrial fibrillation with recommendations to begin flecainide and plan for DCCV 02/24/2020 presented 02/22/2020 for bouts of syncope.  On arrival to the ED heart rate elevated 110s-130 potassium 3.5 magnesium 1.9 creatinine 1.1 hemoglobin 11.2 platelets 295,000 BNP 266 EKG atrial fibrillation with rate 145.  Cardiology service follow-up is Toprol was discontinued.  A follow-up echocardiogram ejection fraction of 60 to 65% no wall motion abnormalities.  Patient again with episodes in and out of atrial fibrillation placed on amiodarone and adjusted he remains on Eliquis.  He is tolerating a regular diet.  Therapies have been ongoing with progressive gains.  Patient was admitted for a comprehensive rehab program.   LBM this AM- going to bathroom with assistance Voiding well Takes claritin at home- needs it Rash on inner legs- needs nystatin powder for this.    Review of Systems  Constitutional: Positive for  malaise/fatigue. Negative for fever.  HENT: Negative for hearing loss.   Eyes: Negative for blurred vision and double vision.  Respiratory: Positive for shortness of breath. Negative for cough.   Cardiovascular: Positive for palpitations and leg swelling. Negative for chest pain.  Gastrointestinal: Positive for constipation. Negative for heartburn, nausea and vomiting.       GERD  Genitourinary: Negative for dysuria, flank pain and hematuria.  Musculoskeletal: Positive for falls, joint pain and myalgias.  Skin: Negative for rash.  Neurological: Positive for dizziness and weakness.       Syncope  Psychiatric/Behavioral: Positive for depression. The patient has insomnia.   All other systems reviewed and are negative.  Past Medical History:  Diagnosis Date  . 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 Anemia   . Bradycardia    low 20's  . Cardiac arrest ([place]) 04/20/2019   12 min CPR with epi  . Dysrhythmia   . Fall at home 03/28/2016  . Fatty liver   . GERD (gastroesophageal reflux disease)   . H/O seasonal allergies   . History of GI bleed   . Hx of adenomatous colonic polyps 08/10/2017  . Hyperlipidemia   . Hypertension   . Insomnia   . 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 MI (myocardial infarction) ([place]) 04/2019   had CPR by EMS  . OA (osteoarthritis)    hands  . OSA (obstructive sleep apnea)    No longer uses CPAP  . Persistent atrial fibrillation with rapid ventricular response ([place]) 02/05/2020   Past Surgical History:  Procedure Laterality Date  . ANKLE SURGERY Left 2014   had rods  put in   . BIOPSY  02/28/2019   Procedure: BIOPSY;  Surgeon: Milus Banister, MD;  Location: [facility] ENDOSCOPY;  Service: Endoscopy;;  . CARDIOVERSION  02/06/2020  . CARDIOVERSION N/A 02/06/2020   Procedure: CARDIOVERSION;  Surgeon: Werner Lean, MD;  Location[facility] ENDOSCOPY;  Service: Cardiovascular;  Laterality: N/A;  . CARDIOVERSION N/A  02/24/2020   Procedure: CARDIOVERSION;  Surgeon: Werner Lean, MD;  Location: [place] ENDOSCOPY;  Service: Cardiovascular;  Laterality: N/A;  . [place]  . ESOPHAGOGASTRODUODENOSCOPY (EGD) WITH PROPOFOL N/A 02/28/2019   Procedure: ESOPHAGOGASTRODUODENOSCOPY (EGD) WITH PROPOFOL;  Surgeon: Milus Banister, MD;  Location: [facility] ENDOSCOPY;  Service: Endoscopy;  Laterality: N/A;  . HIP ARTHROPLASTY Left 04/01/2016   Procedure: ARTHROPLASTY BIPOLAR HIP (HEMIARTHROPLASTY);  Surgeon: Paralee Cancel, MD;  Location: [place];  Service: Orthopedics;  Laterality: Left;  . HIP CLOSED REDUCTION Left 04/30/2016   Procedure: CLOSED REDUCTION HIP;  Surgeon: Wylene Simmer, MD;  Location: [place];  Service: Orthopedics;  Laterality: Left;  . TONSILLECTOMY    . TOTAL HIP ARTHROPLASTY Right 10/15/2019   Procedure: TOTAL HIP ARTHROPLASTY ANTERIOR APPROACH;  Surgeon: Paralee Cancel, MD;  Location: [facility];  Service: Orthopedics;  Laterality: Right;  70 mins   Family History  Problem Relation Age of Onset  . Heart disease Mother 81  . Hypertension Mother   . Arthritis Mother   . Heart failure Father 55  . Stroke Maternal Aunt   . Heart failure Sister   . Colon cancer Neg Hx   . Esophageal cancer Neg Hx   . Stomach cancer Neg Hx   . Rectal cancer Neg Hx    Social History:  reports that he has quit smoking. His smoking use included cigarettes. He has never used smokeless tobacco. He reports that he does not drink alcohol and does not use drugs. Allergies:  Allergies  Allergen Reactions  . Penicillins Other (See Comments)    Tolerated Cephalosporin Date: 10/15/19. Allergy is from childhood Did it involve swelling of the face/tongue/throat, SOB, or low BP? Unknown Did it involve sudden or severe rash/hives, skin peeling, or any reaction on the inside of your mouth or nose? Unknown Did you need to seek medical attention at a hospital or doctor's office? Unknown When did it last happen?childhood  reaction If all above answers are "NO", may proceed with cephalosporin use.   Medications Prior to Admission  Medication Sig Dispense Refill  . apixaban (ELIQUIS) 5 MG TABS tablet Take 1 tablet (5 mg total) by mouth 2 (two) times daily. 60 tablet 0  . atorvastatin (LIPITOR) 20 MG tablet Take 1 tablet (20 mg total) by mouth daily. 30 tablet 0  . escitalopram (LEXAPRO) 10 MG tablet Take 1 tablet (10 mg total) by mouth daily. 30 tablet 0  . ferrous sulfate 325 (65 FE) MG tablet Take 1 tablet (325 mg total) by mouth 2 (two) times daily with a meal. 60 tablet 0  . flecainide (TAMBOCOR) 100 MG tablet Take 1 tablet (100 mg total) by mouth 2 (two) times daily. 476 tablet 3  . folic acid (FOLVITE) 1 MG tablet Take 1 tablet (1 mg total) by mouth daily. 30 tablet 0  . furosemide (LASIX) 40 MG tablet Take 1 tablet (40 mg total) by mouth daily as needed for fluid (weight gain). (Patient taking differently: Take 40 mg by mouth daily as needed for fluid or edema (or weight gain). ) 30 tablet 0  . loratadine (CLARITIN) 10 MG  tablet Take 1 tablet (10 mg total) by mouth daily. 30 tablet 0  . metoprolol succinate (TOPROL-XL) 25 MG 24 hr tablet Take 50 mg by mouth daily.    . Multiple Vitamins-Minerals (CENTRUM SILVER 50+MEN) TABS Take 1 tablet by mouth daily with breakfast.    . pantoprazole (PROTONIX) 40 MG tablet Take 1 tablet (40 mg total) by mouth daily. (Patient taking differently: Take 40 mg by mouth daily before breakfast. ) 30 tablet 0  . potassium chloride SA (KLOR-CON) 20 MEQ tablet Take 2 tablets (40 mEq total) by mouth daily. 60 tablet 0  . tamsulosin (FLOMAX) 0.4 MG CAPS capsule Take 0.4 mg by mouth every evening.    . Thiamine HCl (VITAMIN B-1) 250 MG tablet Take 1 tablet (250 mg total) by mouth daily. 30 tablet 0  . traZODone (DESYREL) 50 MG tablet Take 1 tablet (50 mg total) by mouth at bedtime. 30 tablet 0  . magnesium oxide (MAG-OX) 400 (241.3 Mg) MG tablet Take 1 tablet (400 mg total) by mouth  daily. 90 tablet 3  . metoprolol succinate (TOPROL-XL) 50 MG 24 hr tablet Take 1 tablet (50 mg total) by mouth daily. Take with or immediately following a meal. 90 tablet 3    Drug Regimen Review Drug regimen was reviewed and remains appropriate with no significant issues identified  Home: Home Living Family/patient expects to be discharged to:: Private residence Living Arrangements: Alone Available Help at Discharge: Family, Available PRN/intermittently, Neighbor (sister in [place] and she can check on him) Type of Home: Other(Comment) (townhome) Home Access: Level entry Home Layout: Two level, Able to live on main level with bedroom/bathroom, Full bath on main level Alternate Level Stairs-Number of Steps: does not go upstairs Bathroom Shower/Tub: Multimedia programmer: Handicapped height Bathroom Accessibility: Yes Home Equipment: Environmental consultant - 2 wheels, Environmental consultant - 4 wheels, Bedside commode, Wheelchair - manual, Shower seat - built in, [employer] held shower head, Radiation protection practitioner Equipment: Long-handled shoe horn Additional Comments: pt drives and does his own grocery shopping. patient reports prior to COVID he was swimming 2-3x/week at [facility] and would like to get back to that.   Functional History: Prior Function Level of Independence: Independent with assistive device(s) Comments: has been using RW and rollator at home; reports he manages his own medicines and bills  Functional Status:  Mobility: Bed Mobility Overal bed mobility: Needs Assistance Bed Mobility: Supine to Sit Supine to sit: Min guard Sit to supine: Min assist General bed mobility comments: increased effort to come to EoB Transfers Overall transfer level: Needs assistance Equipment used: Rolling walker (2 wheeled) Transfers: Sit to/from Stand Sit to Stand: Min guard Stand pivot transfers: Max assist General transfer comment: min guard for safety, pt with good power up and steadying, however needs  increased cuing for reaching back to chair and controlling his descent  Ambulation/Gait Ambulation/Gait assistance: Min assist, Mod assist Gait Distance (Feet): 50 Feet (requires 2x standing rest breaks due to DoE) Assistive device: Rolling walker (2 wheeled) Gait Pattern/deviations: Step-through pattern, Decreased step length - right, Decreased step length - left, Shuffle General Gait Details: min A for steadying with 2x LoB posteriorly equiring Mod A for steadying, pt with poor pleth wave form with ambulation and despite 4/4 DoE when pleth is good SaO2 >90% Gait velocity: slowed Gait velocity interpretation: <1.8 ft/sec, indicate of risk for recurrent falls    ADL: ADL Overall ADL's : Needs assistance/impaired Eating/Feeding: Independent, Sitting Grooming: Wash/dry hands, Standing, Min guard, Brushing hair Grooming  Details (indicate cue type and reason): EOB Upper Body Bathing: Minimal assistance, Sitting Lower Body Bathing: Sitting/lateral leans, Sit to/from stand, Moderate assistance Upper Body Dressing : Minimal assistance, Sitting Lower Body Dressing: Set up, Sitting/lateral leans Lower Body Dressing Details (indicate cue type and reason): donned slip on shoes Toilet Transfer: Minimal assistance, Ambulation, RW, Regular Toilet, Grab bars Toilet Transfer Details (indicate cue type and reason): attempted to stand from EOB x4 before success with min A, unsafe hand placement and decreased safety awareness.  Toileting- Water quality scientist and Hygiene: Min guard, Sit to/from Sales promotion account executive Details (indicate cue type and reason): able to complete peri-care independently Functional mobility during ADLs: Minimal assistance, Rolling walker General ADL Comments: pt motivated to go to the bathroom and ambulate to date, continues to make steady progress  Cognition: Cognition Overall Cognitive Status: Impaired/Different from baseline Orientation Level: Oriented  X4 Cognition Arousal/Alertness: Awake/alert Behavior During Therapy: Anxious, Impulsive Overall Cognitive Status: Impaired/Different from baseline Area of Impairment: Attention, Memory, Following commands, Safety/judgement, Awareness, Problem solving Current Attention Level: Selective, Alternating Memory: Decreased short-term memory Following Commands: Follows multi-step commands inconsistently Safety/Judgement: Decreased awareness of safety, Decreased awareness of deficits Awareness: Emergent Problem Solving: Difficulty sequencing, Requires verbal cues General Comments: PT has seen pt at least 3 times and does not remember, continues to be confused about why he has not gone home despite education that he has not been medically ready  Physical Exam: Blood pressure (!) 157/83, pulse 84, temperature 97.9 F (36.6 C), temperature source Oral, resp. rate 20, height 6' (1.829 m), weight 101.3 kg, SpO2 95 %. Physical Exam Vitals and nursing note reviewed.  Constitutional:      Comments: Older male sitting up in bed watching TV, appropriate, NAD  HENT:     Head: Normocephalic and atraumatic.     Comments: Smile equal    Right Ear: External ear normal.     Left Ear: External ear normal.     Nose: Congestion present.     Mouth/Throat:     Mouth: Mucous membranes are moist.     Pharynx: No oropharyngeal exudate.  Eyes:     General:        Right eye: No discharge.        Left eye: No discharge.     Extraocular Movements: Extraocular movements intact.  Cardiovascular:     Rate and Rhythm: Normal rate. Rhythm irregular.     Heart sounds: Normal heart sounds. No murmur heard.   Pulmonary:     Comments: A little wheezy audibly and with listening- but good air movement B/L Abdominal:     Comments: Soft, NT, ND, (+)BS    Genitourinary:    Comments: Rash on scrotum and inner thighs- yeasty- has nystatin powder on  Musculoskeletal:     Cervical back: Normal range of motion and neck  supple.     Comments: UEs 5/5 in B/L UEs deltoid, biceps, triceps, WE< grip and finger abd LEs- 5-/5 in HF, KE, KF, DF and PF B/L   Skin:    Comments: Rash between legs- yeasty- red- and on scrotum IV R hand- looks ok No skin breakdown on backside or heels  Neurological:     Mental Status: He is oriented to person, place, and time.     Comments: Patient is alert in no acute distress.  Oriented x3 and follows commands.  Intact to light touch in all 4 extremities   Psychiatric:        Mood  and Affect: Mood normal.        Behavior: Behavior normal.     Results for orders placed or performed during the hospital encounter of 02/22/20 (from the past 48 hour(s))  Basic metabolic panel     Status: Abnormal   Collection Time: 03/05/20 12:33 AM  Result Value Ref Range   Sodium 133 (L) 135 - 145 mmol/L   Potassium 3.4 (L) 3.5 - 5.1 mmol/L   Chloride 100 98 - 111 mmol/L   CO2 22 22 - 32 mmol/L   Glucose, Bld 94 70 - 99 mg/dL    Comment: Glucose reference range applies only to samples taken after fasting for at least 8 hours.   BUN 10 8 - 23 mg/dL   Creatinine, Ser 1.09 0.61 - 1.24 mg/dL   Calcium 8.6 (L) 8.9 - 10.3 mg/dL   GFR, Estimated >60 >60 mL/min    Comment: (NOTE) Calculated using the CKD-EPI Creatinine Equation (2021)    Anion gap 11 5 - 15    Comment: Performed at [facility] [address] [place], [place] [address]  Magnesium     Status: Abnormal   Collection Time: 03/05/20 12:33 AM  Result Value Ref Range   Magnesium 1.3 (L) 1.7 - 2.4 mg/dL    Comment: Performed at [facility] [address], [place], [place] [address]   No results found.     Medical Problem List and Plan: 1.   Debility secondary to atrial fibrillation with RVR/multimedical  -patient may  shower  -ELOS/Goals: 7-10 days mod I 2.  Antithrombotics: -DVT/anticoagulation: Eliquis  -antiplatelet therapy: N/A 3. Pain Management: Tylenol as needed 4. Mood: Lexapro 10 mg daily.   Provide emotional support  -antipsychotic agents: N/A 5. Neuropsych: This patient is capable of making decisions on his own behalf. 6. Skin/Wound Care: Routine skin checks 7. Fluids/Electrolytes/Nutrition: Routine in and outs with follow-up chemistries 8.  PAF.  Amiodarone 400 mg twice daily x1 week then decrease to 200 mg daily as per cardiology services.  Continue Eliquis 9.  Hypertension.  Monitor with increased mobility 10.  Chronic diastolic dysfunction.  Monitor for any signs of fluid overload 11.  Hyperlipidemia.  Lipitor  12.  GERD.  Protonix 13.  History of alcohol use.  Counseling 14.  Medical noncompliance.  Provide counseling 15.  Acute on chronic anemia.  Continue iron supplement.  Follow-up CBC 16. Wheezing/allergies- needs some Claritin 17. Rash between legs- needs nystatin powder/cream    Lavon Paganini Angiulli, PA-C 03/05/2020     I have personally performed a face to face diagnostic evaluation of this patient and formulated the key components of the plan.  Additionally, I have personally reviewed laboratory data, imaging studies, as well as relevant notes and concur with the physician assistant's documentation above.   The patient's status has not changed from the original H&P.  Any changes in documentation from the acute care chart have been noted above.

## 2020-03-06 ENCOUNTER — Encounter (HOSPITAL_COMMUNITY): Payer: Self-pay | Admitting: Physical Medicine & Rehabilitation

## 2020-03-06 ENCOUNTER — Inpatient Hospital Stay (HOSPITAL_COMMUNITY): Payer: Medicare Other | Admitting: Occupational Therapy

## 2020-03-06 ENCOUNTER — Inpatient Hospital Stay (HOSPITAL_COMMUNITY): Payer: Medicare Other

## 2020-03-06 ENCOUNTER — Inpatient Hospital Stay (HOSPITAL_COMMUNITY)
Admission: RE | Admit: 2020-03-06 | Discharge: 2020-03-11 | DRG: 945 | Disposition: A | Discharge status: Home Health | Payer: Medicare Other | Source: Intra-hospital | Attending: Physical Medicine & Rehabilitation | Admitting: Physical Medicine & Rehabilitation

## 2020-03-06 ENCOUNTER — Other Ambulatory Visit: Payer: Self-pay

## 2020-03-06 DIAGNOSIS — R5381 Other malaise: Principal | ICD-10-CM | POA: Diagnosis present

## 2020-03-06 DIAGNOSIS — I4819 Other persistent atrial fibrillation: Secondary | ICD-10-CM | POA: Diagnosis present

## 2020-03-06 DIAGNOSIS — I4891 Unspecified atrial fibrillation: Secondary | ICD-10-CM

## 2020-03-06 DIAGNOSIS — E8809 Other disorders of plasma-protein metabolism, not elsewhere classified: Secondary | ICD-10-CM

## 2020-03-06 DIAGNOSIS — E46 Unspecified protein-calorie malnutrition: Secondary | ICD-10-CM

## 2020-03-06 DIAGNOSIS — Z96643 Presence of artificial hip joint, bilateral: Secondary | ICD-10-CM | POA: Diagnosis present

## 2020-03-06 DIAGNOSIS — Z8679 Personal history of other diseases of the circulatory system: Secondary | ICD-10-CM

## 2020-03-06 DIAGNOSIS — K760 Fatty (change of) liver, not elsewhere classified: Secondary | ICD-10-CM | POA: Diagnosis present

## 2020-03-06 DIAGNOSIS — Z880 Allergy status to penicillin: Secondary | ICD-10-CM | POA: Diagnosis not present

## 2020-03-06 DIAGNOSIS — R21 Rash and other nonspecific skin eruption: Secondary | ICD-10-CM | POA: Diagnosis present

## 2020-03-06 DIAGNOSIS — Z87891 Personal history of nicotine dependence: Secondary | ICD-10-CM | POA: Diagnosis not present

## 2020-03-06 DIAGNOSIS — G4733 Obstructive sleep apnea (adult) (pediatric): Secondary | ICD-10-CM | POA: Diagnosis present

## 2020-03-06 DIAGNOSIS — Z8249 Family history of ischemic heart disease and other diseases of the circulatory system: Secondary | ICD-10-CM

## 2020-03-06 DIAGNOSIS — Z8674 Personal history of sudden cardiac arrest: Secondary | ICD-10-CM | POA: Diagnosis not present

## 2020-03-06 DIAGNOSIS — T7840XA Allergy, unspecified, initial encounter: Secondary | ICD-10-CM

## 2020-03-06 DIAGNOSIS — Z9119 Patient's noncompliance with other medical treatment and regimen: Secondary | ICD-10-CM

## 2020-03-06 DIAGNOSIS — I110 Hypertensive heart disease with heart failure: Secondary | ICD-10-CM | POA: Diagnosis present

## 2020-03-06 DIAGNOSIS — I5032 Chronic diastolic (congestive) heart failure: Secondary | ICD-10-CM | POA: Diagnosis present

## 2020-03-06 DIAGNOSIS — K219 Gastro-esophageal reflux disease without esophagitis: Secondary | ICD-10-CM | POA: Diagnosis present

## 2020-03-06 DIAGNOSIS — T7840XD Allergy, unspecified, subsequent encounter: Secondary | ICD-10-CM

## 2020-03-06 DIAGNOSIS — Z823 Family history of stroke: Secondary | ICD-10-CM

## 2020-03-06 DIAGNOSIS — Z8719 Personal history of other diseases of the digestive system: Secondary | ICD-10-CM

## 2020-03-06 DIAGNOSIS — Z7901 Long term (current) use of anticoagulants: Secondary | ICD-10-CM | POA: Diagnosis not present

## 2020-03-06 DIAGNOSIS — E785 Hyperlipidemia, unspecified: Secondary | ICD-10-CM | POA: Diagnosis present

## 2020-03-06 DIAGNOSIS — D638 Anemia in other chronic diseases classified elsewhere: Secondary | ICD-10-CM

## 2020-03-06 DIAGNOSIS — D649 Anemia, unspecified: Secondary | ICD-10-CM

## 2020-03-06 DIAGNOSIS — Z79899 Other long term (current) drug therapy: Secondary | ICD-10-CM | POA: Diagnosis not present

## 2020-03-06 DIAGNOSIS — Z8261 Family history of arthritis: Secondary | ICD-10-CM

## 2020-03-06 DIAGNOSIS — F5101 Primary insomnia: Secondary | ICD-10-CM

## 2020-03-06 DIAGNOSIS — I480 Paroxysmal atrial fibrillation: Secondary | ICD-10-CM | POA: Diagnosis not present

## 2020-03-06 DIAGNOSIS — M19042 Primary osteoarthritis, left hand: Secondary | ICD-10-CM | POA: Diagnosis present

## 2020-03-06 DIAGNOSIS — I252 Old myocardial infarction: Secondary | ICD-10-CM | POA: Diagnosis not present

## 2020-03-06 DIAGNOSIS — F339 Major depressive disorder, recurrent, unspecified: Secondary | ICD-10-CM

## 2020-03-06 DIAGNOSIS — E7849 Other hyperlipidemia: Secondary | ICD-10-CM

## 2020-03-06 DIAGNOSIS — M19041 Primary osteoarthritis, right hand: Secondary | ICD-10-CM | POA: Diagnosis present

## 2020-03-06 LAB — CBC WITH DIFFERENTIAL/PLATELET
Abs Immature Granulocytes: 0.03 10*3/uL (ref 0.00–0.07)
Basophils Absolute: 0.1 10*3/uL (ref 0.0–0.1)
Basophils Relative: 1 %
Eosinophils Absolute: 0.3 10*3/uL (ref 0.0–0.5)
Eosinophils Relative: 3 %
HCT: 34.3 % — ABNORMAL LOW (ref 39.0–52.0)
Hemoglobin: 11.3 g/dL — ABNORMAL LOW (ref 13.0–17.0)
Immature Granulocytes: 0 %
Lymphocytes Relative: 20 %
Lymphs Abs: 1.7 10*3/uL (ref 0.7–4.0)
MCH: 31.4 pg (ref 26.0–34.0)
MCHC: 32.9 g/dL (ref 30.0–36.0)
MCV: 95.3 fL (ref 80.0–100.0)
Monocytes Absolute: 0.7 10*3/uL (ref 0.1–1.0)
Monocytes Relative: 9 %
Neutro Abs: 5.7 10*3/uL (ref 1.7–7.7)
Neutrophils Relative %: 67 %
Platelets: 205 10*3/uL (ref 150–400)
RBC: 3.6 MIL/uL — ABNORMAL LOW (ref 4.22–5.81)
RDW: 14.6 % (ref 11.5–15.5)
WBC: 8.5 10*3/uL (ref 4.0–10.5)
nRBC: 0 % (ref 0.0–0.2)

## 2020-03-06 LAB — COMPREHENSIVE METABOLIC PANEL
ALT: 18 U/L (ref 0–44)
AST: 24 U/L (ref 15–41)
Albumin: 3 g/dL — ABNORMAL LOW (ref 3.5–5.0)
Alkaline Phosphatase: 75 U/L (ref 38–126)
Anion gap: 10 (ref 5–15)
BUN: 7 mg/dL — ABNORMAL LOW (ref 8–23)
CO2: 21 mmol/L — ABNORMAL LOW (ref 22–32)
Calcium: 8.7 mg/dL — ABNORMAL LOW (ref 8.9–10.3)
Chloride: 106 mmol/L (ref 98–111)
Creatinine, Ser: 0.93 mg/dL (ref 0.61–1.24)
GFR, Estimated: >60 mL/min (ref >60)
Glucose, Bld: 97 mg/dL (ref 70–99)
Potassium: 3.5 mmol/L (ref 3.5–5.1)
Sodium: 137 mmol/L (ref 135–145)
Total Bilirubin: 1.2 mg/dL (ref 0.3–1.2)
Total Protein: 5.7 g/dL — ABNORMAL LOW (ref 6.5–8.1)

## 2020-03-06 MED ORDER — NITROGLYCERIN 0.4 MG SL SUBL: 0.4000 mg | SUBLINGUAL_TABLET | SUBLINGUAL | Status: DC | PRN

## 2020-03-06 MED ORDER — PROSOURCE PLUS PO LIQD
30.0000 mL | Freq: Two times a day (BID) | ORAL | Status: DC
  Administered 2020-03-06 – 2020-03-11 (×11): 30 mL via ORAL
  Filled 2020-03-06 (×9): qty 30

## 2020-03-06 MED ORDER — FOLIC ACID 1 MG PO TABS
1.0000 mg | ORAL_TABLET | Freq: Every day | ORAL | Status: DC
  Administered 2020-03-06 – 2020-03-11 (×6): 1 mg via ORAL
  Filled 2020-03-06 (×6): qty 1

## 2020-03-06 MED ORDER — ACETAMINOPHEN 325 MG PO TABS: 650.0000 mg | ORAL_TABLET | ORAL | Status: DC | PRN

## 2020-03-06 MED ORDER — AMIODARONE HCL 200 MG PO TABS: 400.0000 mg | ORAL_TABLET | Freq: Two times a day (BID) | ORAL | Status: DC

## 2020-03-06 MED ORDER — IPRATROPIUM BROMIDE 0.02 % IN SOLN: 0.5000 mg | Freq: Four times a day (QID) | RESPIRATORY_TRACT | Status: DC | PRN

## 2020-03-06 MED ORDER — AMIODARONE HCL 200 MG PO TABS: 200.0000 mg | ORAL_TABLET | Freq: Every day | ORAL | Status: DC

## 2020-03-06 MED ORDER — PANTOPRAZOLE SODIUM 40 MG PO TBEC
40.0000 mg | DELAYED_RELEASE_TABLET | Freq: Every day | ORAL | Status: DC
  Administered 2020-03-06 – 2020-03-11 (×6): 40 mg via ORAL
  Filled 2020-03-06 (×7): qty 1

## 2020-03-06 MED ORDER — TRAZODONE HCL 50 MG PO TABS
50.0000 mg | ORAL_TABLET | Freq: Every day | ORAL | Status: DC
  Administered 2020-03-06 – 2020-03-10 (×5): 50 mg via ORAL
  Filled 2020-03-06 (×5): qty 1

## 2020-03-06 MED ORDER — MICONAZOLE NITRATE POWD
Freq: Two times a day (BID) | Status: DC
  Administered 2020-03-10: 1 via TOPICAL

## 2020-03-06 MED ORDER — LORATADINE 10 MG PO TABS
10.0000 mg | ORAL_TABLET | Freq: Every day | ORAL | Status: DC
  Administered 2020-03-06 – 2020-03-11 (×6): 10 mg via ORAL
  Filled 2020-03-06 (×6): qty 1

## 2020-03-06 MED ORDER — APIXABAN 5 MG PO TABS
5.0000 mg | ORAL_TABLET | Freq: Two times a day (BID) | ORAL | Status: DC
  Administered 2020-03-06 – 2020-03-11 (×11): 5 mg via ORAL
  Filled 2020-03-06 (×11): qty 1

## 2020-03-06 MED ORDER — THIAMINE HCL 100 MG PO TABS
250.0000 mg | ORAL_TABLET | Freq: Every day | ORAL | Status: DC
  Administered 2020-03-06 – 2020-03-11 (×6): 250 mg via ORAL
  Filled 2020-03-06 (×6): qty 3

## 2020-03-06 MED ORDER — AMIODARONE HCL 200 MG PO TABS
400.0000 mg | ORAL_TABLET | Freq: Two times a day (BID) | ORAL | Status: DC
  Administered 2020-03-06 – 2020-03-11 (×11): 400 mg via ORAL
  Filled 2020-03-06 (×11): qty 2

## 2020-03-06 MED ORDER — FERROUS SULFATE 325 (65 FE) MG PO TABS
325.0000 mg | ORAL_TABLET | Freq: Two times a day (BID) | ORAL | Status: DC
  Administered 2020-03-06 – 2020-03-11 (×11): 325 mg via ORAL
  Filled 2020-03-06 (×11): qty 1

## 2020-03-06 MED ORDER — TAMSULOSIN HCL 0.4 MG PO CAPS
0.4000 mg | ORAL_CAPSULE | Freq: Every day | ORAL | Status: DC
  Administered 2020-03-06 – 2020-03-11 (×6): 0.4 mg via ORAL
  Filled 2020-03-06 (×6): qty 1

## 2020-03-06 MED ORDER — ESCITALOPRAM OXALATE 10 MG PO TABS
10.0000 mg | ORAL_TABLET | Freq: Every day | ORAL | Status: DC
  Administered 2020-03-06 – 2020-03-11 (×6): 10 mg via ORAL
  Filled 2020-03-06 (×6): qty 1

## 2020-03-06 MED ORDER — ATORVASTATIN CALCIUM 10 MG PO TABS
20.0000 mg | ORAL_TABLET | Freq: Every day | ORAL | Status: DC
  Administered 2020-03-06 – 2020-03-11 (×6): 20 mg via ORAL
  Filled 2020-03-06 (×6): qty 2

## 2020-03-06 NOTE — Progress Notes (Signed)
Patient admitted to unit via bed transport.

## 2020-03-06 NOTE — Progress Notes (Signed)
Patient information reviewed and entered into [facility] System by Becky Rinoa Garramone, PPS coordinator. Information including medical coding, function ability, and quality indicators will be reviewed and updated through discharge.   

## 2020-03-06 NOTE — IPOC Note (Signed)
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 ([place])   Allergies   Anemia, chronic disease   Chronic diastolic congestive heart failure (H[facility])   History of hypertension   Atrial fibrillation with RVR ([place])      Functional Problem List: Nursing Bladder, Bowel, Endurance, Skin Integrity, Safety, Pain, Medication Management  PT Balance, Behavior, Edema, Motor, Safety, Skin Integrity  OT Balance, Perception, Behavior, Safety, Cognition, Skin Integrity, Endurance, Motor  SLP    TR         Basic ADL's: OT Grooming, Bathing, Dressing, Toileting     Advanced  ADL's: OT       Transfers: PT Bed Mobility, Bed to Chair, Teacher, early years/pre, Tub/Shower     Locomotion: PT Ambulation, Stairs     Additional Impairments: OT    SLP        TR      Anticipated Outcomes Item Anticipated Outcome  Self Feeding independent  Swallowing      Basic self-care  supervision-mod I  Toileting  mod I   Bathroom Transfers mod I  Bowel/Bladder  Patient will be continent with min assist  Transfers  mod I with LRAD  Locomotion  supervision with LRAD  Communication     Cognition     Pain  pain less than 3  Safety/Judgment  No falls during this admission   Therapy Plan: PT Intensity: Minimum of 1-2 x/day ,45 to 90 minutes PT Frequency: 5 out of 7 days PT Duration Estimated Length of Stay: 7-10 days OT Intensity: Minimum of 1-2 x/day, 45 to 90 minutes OT Frequency: 5 out of 7 days OT Duration/Estimated Length of Stay: 7-10 days      Team Interventions: Nursing Interventions Patient/Family Education, Bowel Management, Bladder Management, Disease Management/Prevention, Pain Management, Medication Management, Discharge Planning, Psychosocial Support  PT  interventions Ambulation/gait training, Community reintegration, Patient/family education, Stair training, UE/LE Coordination activities, UE/LE Strength taining/ROM, Splinting/orthotics, Pain management, DME/adaptive equipment instruction, Cognitive remediation/compensation, Training and development officer, Neuromuscular re-education, Disease management/prevention, Skin care/wound management, Therapeutic Exercise, Wheelchair propulsion/positioning, Visual/perceptual remediation/compensation, Therapeutic Activities, Psychosocial support, Functional mobility training, Discharge planning  OT Interventions Balance/vestibular training, Discharge planning, Self Care/advanced ADL retraining, Therapeutic Activities, UE/LE Coordination activities, Cognitive remediation/compensation, Disease mangement/prevention, Functional mobility training, Patient/family education, Skin care/wound managment, Therapeutic Exercise, DME/adaptive equipment instruction, Neuromuscular re-education, Psychosocial support, UE/LE Strength taining/ROM  SLP Interventions    TR Interventions    SW/CM Interventions Discharge Planning, Psychosocial Support, Patient/Family Education   Barriers to Discharge MD  Medical stability and A fib with RVR  Nursing      PT Behavior, Decreased caregiver support    OT      SLP      SW       Team Discharge Planning: Destination: PT-  ,OT- Home , SLP-  Projected Follow-up: PT-24 hour supervision/assistance, Other (comment) (May benefit from OP cardiac rehab), OT-  Other (comment) (intermittent supervision from family), SLP-  Projected Equipment Needs: PT-To be determined, OT- To be determined, SLP-  Equipment Details: PT-Pt has: rollator and RW, OT-Pt reports he already has shower chair, grab bars for toilet, RW, rollator, w/c, cane, sock aide, reacher, long handled sponge Patient/family involved in discharge planning: PT- Patient,  OT-Patient, SLP-   MD ELOS: 6-9 days. 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  Patient with recent admission 02/05/2020 to 02/07/2020 for A. fib with RVR during which she received cardioversion with hospital course complicated by electrolyte imbalance and pressure wounds followed by wound care nurse and he was discharged to a skilled nursing facility before returning home. Patient did see his cardiologist 02/18/2020 and follow-up and again in atrial fibrillation with recommendations to begin flecainide and plan for DCCV 02/24/2020 presented 02/22/2020 for bouts of syncope.  On arrival to the ED heart rate elevated 110s-130 potassium 3.5 magnesium 1.9 creatinine 1.1 hemoglobin 11.2 platelets 295,000 BNP 266 EKG atrial fibrillation with rate 145.  Cardiology service follow-up is Toprol was discontinued.  A follow-up echocardiogram ejection fraction of 60 to 65% no wall motion abnormalities.  Patient again with episodes in and out of atrial fibrillation placed on amiodarone and adjusted he remains on Eliquis.  He is tolerating a regular diet. Patient with resulting functional deficits with mobility, transfers, self-care, endurance.  Will set goals for Mod i with PT/OT.  Due to the current state of emergency, patients may not be receiving their 3-hours of Medicare-mandated therapy.  See Team Conference Notes for weekly updates to the plan of care

## 2020-03-06 NOTE — Progress Notes (Signed)
Patient admitted from [facility], arrived in bed transported by 2 nurse techs. Patient friendly, smiling, talkative and appropriate. Appearance clean and well groomed. Cooperative with assessment. Oriented to room, unit and safety precautions, verbally states understanding. Urinals provided, patient agreed not to attempt to get OOB without assist and prior to PT/OT evaluation. Bed alarms on. Bedside table and call bell within reach.

## 2020-03-06 NOTE — Progress Notes (Signed)
[facility] Individual Statement of Services  Patient Name:  Darryl Diaz  Date:  03/06/2020  Welcome to the [facility].  Our goal is to provide you with an individualized program based on your diagnosis and situation, designed to meet your specific needs.  With this comprehensive rehabilitation program, you will be expected to participate in at least 3 hours of rehabilitation therapies Monday-Friday, with modified therapy programming on the weekends.  Your rehabilitation program will include the following services:  Physical Therapy (PT), Occupational Therapy (OT), 24 hour per day rehabilitation nursing, Care Coordinator, Rehabilitation Medicine, Nutrition Services and Pharmacy Services  Weekly team conferences will be held on Wednesday to discuss your progress.  Your Inpatient Rehabilitation Care Coordinator will talk with you frequently to get your input and to update you on team discussions.  Team conferences with you and your family in attendance may also be held.  Expected length of stay: 7-10 days  Overall anticipated outcome: Supervision-independent level  Depending on your progress and recovery, your program may change. Your Inpatient Rehabilitation Care Coordinator will coordinate services and will keep you informed of any changes. Your Inpatient Rehabilitation Care Coordinator's name and contact numbers are listed  below.  The following services may also be recommended but are not provided by the [facility] will be made to provide these services after discharge if needed.  Arrangements include referral to agencies that provide these services.  Your insurance has been verified to be:  Medicare & [place] Your primary doctor is:  Domenick Gong  Pertinent information will be shared with your doctor and  your insurance company.  Inpatient Rehabilitation Care Coordinator:  Ovidio Kin, [place] or Emilia Beck  Information discussed with and copy given to patient by: Elease Hashimoto, 03/06/2020, 9:52 AM

## 2020-03-06 NOTE — Plan of Care (Signed)
  Problem: Consults Goal: RH GENERAL PATIENT EDUCATION Description: Patient will gain knowledge of self-care and disease management to utilize during and after admission. Outcome: Progressing   Problem: RH BOWEL ELIMINATION Goal: RH STG MANAGE BOWEL WITH ASSISTANCE Description: STG Manage Bowel with Assistance. Patient will manage bowel with min assist. Outcome: Progressing   Problem: RH BLADDER ELIMINATION Goal: RH STG MANAGE BLADDER WITH ASSISTANCE Description: STG Manage Bladder With Assistance Patient will manage bladder with min assist. Outcome: Progressing   Problem: RH SKIN INTEGRITY Goal: RH STG SKIN FREE OF INFECTION/BREAKDOWN Description: Patient will not have any new skin break down during this admission.  Outcome: Progressing   Problem: RH SAFETY Goal: RH STG ADHERE TO SAFETY PRECAUTIONS W/ASSISTANCE/DEVICE Description: STG Adhere to Safety Precautions With Assistance/Device. Patient will ambulate with min assist. Outcome: Progressing Goal: RH STG DECREASED RISK OF FALL WITH ASSISTANCE Description: STG Decreased Risk of Fall With Assistance. Patient will call for assistance to ambulate. Outcome: Progressing   Problem: RH PAIN MANAGEMENT Goal: RH STG PAIN MANAGED AT OR BELOW PT'S PAIN GOAL Description: Pain less than 3 on 0-10 pain scale. Outcome: Progressing   Problem: RH KNOWLEDGE DEFICIT GENERAL Goal: RH STG INCREASE KNOWLEDGE OF SELF CARE AFTER HOSPITALIZATION Description: Patient will gain knowledge of self-care to utilize after hospitalization  Outcome: Progressing

## 2020-03-06 NOTE — Progress Notes (Signed)
[facility] PHYSICAL MEDICINE & REHABILITATION PROGRESS NOTE  Subjective/Complaints: Patient seen sitting up in his chair this morning.  He states he slept well overnight.  He states he had a good first therapy session and he is ready to continue with his first day of day focusing on endurance and lower extremity weakness, although he states that is improving.  ROS: + Shortness of breath. Denies CP, SOB, N/V/D  Objective: Vital Signs: Blood pressure (!) 140/99, pulse 98, temperature 98.3 F (36.8 C), resp. rate 19, height 6' (1.829 m), weight 101.4 kg, SpO2 96 %. No results found. Recent Labs    03/06/20 0422  WBC 8.5  HGB 11.3*  HCT 34.3*  PLT 205   Recent Labs    03/05/20 0033 03/06/20 0422  NA 133* 137  K 3.4* 3.5  CL 100 106  CO2 22 21*  GLUCOSE 94 97  BUN 10 7*  CREATININE 1.09 0.93  CALCIUM 8.6* 8.7*    Intake/Output Summary (Last 24 hours) at 03/06/2020 0918 Last data filed at 03/06/2020 0719 Gross per 24 hour  Intake 354 ml  Output 750 ml  Net -396 ml        Physical Exam: BP (!) 140/99   Pulse 98   Temp 98.3 F (36.8 C)   Resp 19   Ht 6' (1.829 m)   Wt 101.4 kg   SpO2 96%   BMI 30.32 kg/m  Constitutional: No distress . Vital signs reviewed. HENT: Normocephalic.  Atraumatic. Eyes: EOMI. No discharge. Cardiovascular: No JVD.  Irregularly irregular Respiratory: Normal effort.  No stridor.  Bilateral clear to auscultation. GI: Non-distended.  BS +. Skin: Warm and dry.  Intact. Psych: Normal mood.  Normal behavior. Musc: No edema in extremities.  No tenderness in extremities. Neuro: Alert Bilateral upper extremities: 5/5 proximal distal Left lower extremity: 4+-5/5 proximal distal Right lower extremity: 4 -/5 hip flexion, knee extension, 4+/5 ankle dorsiflexion  Assessment/Plan: 1. Functional deficits secondary to debility which require 3+ hours per day of interdisciplinary therapy in a comprehensive inpatient rehab setting.  Physiatrist is  providing close team supervision and 24 hour management of active medical problems listed below.  Physiatrist and rehab team continue to assess barriers to discharge/monitor patient progress toward functional and medical goals   Care Tool:  Bathing              Bathing assist       Upper Body Dressing/Undressing Upper body dressing   What is the patient wearing?: Hospital gown only    Upper body assist Assist Level: Set up assist    Lower Body Dressing/Undressing Lower body dressing            Lower body assist       Toileting Toileting    Toileting assist Assist for toileting: Independent with assistive device Assistive Device Comment: urinal   Transfers Chair/bed transfer  Transfers assist           Locomotion Ambulation   Ambulation assist              Walk 10 feet activity   Assist           Walk 50 feet activity   Assist           Walk 150 feet activity   Assist           Walk 10 feet on uneven surface  activity   Assist           Wheelchair  Assist               Wheelchair 50 feet with 2 turns activity    Assist            Wheelchair 150 feet activity     Assist           Medical Problem List and Plan: 1.Debilitysecondary toatrial fibrillation with RVR/multimedical  Begin CIR evaluations 2. Antithrombotics: -DVT/anticoagulation:Eliquis -antiplatelet therapy: N/A 3. Pain Management:Tylenol as needed 4. Mood:Lexapro 10 mg daily. Provide emotional support -antipsychotic agents: N/A 5. Neuropsych: This patientiscapable of making decisions on hisown behalf. 6. Skin/Wound Care:Routine skin checks 7. Fluids/Electrolytes/Nutrition:Routine in and outs. 8. PAF. Amiodarone400 mg twice daily x1 week then decrease to 200 mg dailyas per cardiology services. Continue Eliquis  Monitor with increased exertion  Repeat ECG ordered 9.   History of hypertension.   Monitor with increased mobility 10. Chronic diastolic dysfunction. Monitor for any signs of fluid overload Filed Weights   03/06/20 0031 03/06/20 0500  Weight: 102.3 kg 101.4 kg   11. Hyperlipidemia. Lipitor  12. GERD. Protonix 13. History of alcohol use. Counseling 14. Medical noncompliance. Provide counseling 15. Chronic anemia. Continue iron supplement.   Hemoglobin 11.3 on 10/29  Continue to monitor 16.  Allergies:    Claritin 17. Rash between legs- needs nystatin powder/cream 15.  Hypoalbuminemia  Supplement initiated on 10/29  LOS: 0 days A FACE TO FACE EVALUATION WAS PERFORMED  [employer] Lorie Phenix 03/06/2020, 9:18 AM

## 2020-03-06 NOTE — Progress Notes (Signed)
Patient Details  Name: Darryl Diaz MRN: [id number] Date of Birth: 1940-05-16  Today's Date: 03/06/2020  Hospital Problems: Active Problems:   Debility   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 ([place])   Allergies   Anemia, chronic disease   Chronic diastolic congestive heart failure ([place])   History of hypertension   Atrial fibrillation with RVR ([place])  Past Medical History:  Past Medical History:  Diagnosis Date  . 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 Anemia   . Bradycardia    low 20's  . Cardiac arrest ([place]) 04/20/2019   12 min CPR with epi  . Dysrhythmia   . Fall at home 03/28/2016  . Fatty liver   . GERD (gastroesophageal reflux disease)   . H/O seasonal allergies   . History of GI bleed   . Hx of adenomatous colonic polyps 08/10/2017  . Hyperlipidemia   . Hypertension   . Insomnia   . 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 MI (myocardial infarction) ([place]) 04/2019   had CPR by EMS  . OA (osteoarthritis)    hands  . OSA (obstructive sleep apnea)    No longer uses CPAP  . Persistent atrial fibrillation with rapid ventricular response ([place]) 02/05/2020   Past Surgical History:  Past Surgical History:  Procedure Laterality Date  . ANKLE SURGERY Left 2014   had rods put in   . BIOPSY  02/28/2019   Procedure: BIOPSY;  Surgeon: Milus Banister, MD;  Location: [facility] ENDOSCOPY;  Service: Endoscopy;;  . CARDIOVERSION  02/06/2020  . CARDIOVERSION N/A 02/06/2020   Procedure: CARDIOVERSION;  Surgeon: Werner Lean, MD;  Location[facility] ENDOSCOPY;  Service: Cardiovascular;  Laterality: N/A;  . CARDIOVERSION N/A 02/24/2020   Procedure: CARDIOVERSION;  Surgeon: Werner Lean, MD;  Location: [facility] ENDOSCOPY;  Service: Cardiovascular;  Laterality: N/A;  . [place]  . ESOPHAGOGASTRODUODENOSCOPY (EGD) WITH PROPOFOL N/A 02/28/2019   Procedure: ESOPHAGOGASTRODUODENOSCOPY (EGD) WITH PROPOFOL;   Surgeon: Milus Banister, MD;  Location: [facility] ENDOSCOPY;  Service: Endoscopy;  Laterality: N/A;  . HIP ARTHROPLASTY Left 04/01/2016   Procedure: ARTHROPLASTY BIPOLAR HIP (HEMIARTHROPLASTY);  Surgeon: Paralee Cancel, MD;  Location: [facility];  Service: Orthopedics;  Laterality: Left;  . HIP CLOSED REDUCTION Left 04/30/2016   Procedure: CLOSED REDUCTION HIP;  Surgeon: Wylene Simmer, MD;  Location: [facility];  Service: Orthopedics;  Laterality: Left;  . TONSILLECTOMY    . 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 His smoking use included cigarettes. He has never used smokeless tobacco. He reports that he does not drink alcohol and does not use drugs.  Family / Support Systems Marital Status: Widow/Widower Patient Roles: Parent, Other (Comment) (sibling) Children: Ardis Hughs [phone] [place]  [name]-daughter [place] Other Supports: [name]-sister [phone]  [phone] She and husband are local Anticipated Caregiver: Have worked out Risk analyst and uncle coming Tuesday 11/2-11/6 then son coming 11/6-Thanksgiving and daughter joining Thanksgiving Ability/Limitations of Caregiver: Local sister and children are out of state Caregiver Availability: 24/7 (For a short time) Family Dynamics: Close knit with two children, stepdaughter and local sister and brother in-law. All will pull together to assist him and get him back on his feet.  Social History Preferred language: English Religion: Protestant Cultural  Background: No issues Education: HS Read: Yes Write: Yes Employment Status: Retired Date Retired/Disabled/Unemployed: Banker Issues: No issues Guardian/Conservator: None-according to MD pt is capable of making his own decisions  while here   Abuse/Neglect Abuse/Neglect Assessment Can Be Completed: Yes Physical Abuse: Denies Verbal Abuse: Denies Sexual Abuse: Denies Exploitation of patient/patient's resources: Denies Self-Neglect: Denies  Emotional Status Pt's affect, behavior and adjustment status: Pt is motivated to do well here and is very pleased with his PT session he just had. He feels he needs to get stronger and better with his endurance while here. He has good movement and is optimistic regarding this Recent Psychosocial Issues: other health issues-recent hospitalizations for heart issues Psychiatric History: No history coping appropriately and doing well. Very positive and future goal oriented. He seems to be doing well Substance Abuse History: No issues  Patient / Family Perceptions, Expectations & Goals Pt/Family understanding of illness & functional limitations: Pt and son can explain his heart issues and the rrecent hospitalization. Both have spoken with the MD and feel they have a good understanding of his treatment plan going forward. Premorbid pt/family roles/activities: Father, Stepfather, retire, brother, friend, etc Anticipated changes in roles/activities/participation: resume Pt/family expectations/goals: Pt states: " I want to get my strength and endurance up while here."  Son states: " I know he will push himself he is a very independent person."  [employer]: None Premorbid Home Care/DME Agencies: Other (Comment) (has a rollator and shower chair, along with Life Alert.) Transportation available at discharge: Self and sister if he is not able to when leaves the hospital  Discharge Planning Living Arrangements: Alone Support Systems: Children, Other relatives, Friends/neighbors, Church/faith community Type of Residence: Private residence Insurance Resources: [employer], Multimedia programmer (specify) Web designer) Financial Resources: [place]  Referred: No Living Expenses: Own Money Management: Patient Does the patient have any problems obtaining your medications?: No Home Management: patient does his own home management can hire assist if needed Patient/Family Preliminary Plans: Return home with different family members coming to stay with until after Thanksgiving. He will have 24/7 for a short time. He is also on the waiting list for two ALF for the future. He is aware team evaluating him today and setting goals he hopes to discharge by Tuesday if he is ready. Care Coordinator Anticipated Follow Up Needs: HH/OP  Clinical Impression Pleasant gentleman who is motivated and has a great attitude. He has arranged for his family members to be there with him until after Thanksgiving, then will go from there if more assist is needed. Will await team evaluations and work on discharge needs. He voiced he had a [facility] agency but can't remember the name will look into this.  Elease Hashimoto 03/06/2020, 9:47 AM

## 2020-03-06 NOTE — Evaluation (Signed)
Physical Therapy Assessment and Plan  Patient Details  Name: Darryl Diaz MRN: [id number] Date of Birth: [birth year]  PT Diagnosis: Abnormality of gait, Difficulty walking, Dizziness and giddiness and Muscle weakness Rehab Potential: Good ELOS: 7-10 days   Today's Date: 03/06/2020 PT Individual Time: 0800-0900 + 1405-1530 PT Individual Time Calculation (min): 60 min  + 85 min  Hospital Problem: Active Problems:   Debility   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 (HCC)   Allergies   Anemia, chronic disease   Chronic diastolic congestive heart failure ([place])   History of hypertension   Atrial fibrillation with RVR ([place])   Past Medical History:  Past Medical History:  Diagnosis Date  . Alcoholism ([place])    in remission following wife's death  . Anemia   . Bradycardia    low 20's  . Cardiac arrest ([place]) 04/20/2019   12 min CPR with epi  . Dysrhythmia   . Fall at home 03/28/2016  . Fatty liver   . GERD (gastroesophageal reflux disease)   . H/O seasonal allergies   . History of GI bleed   . Hx of adenomatous colonic polyps 08/10/2017  . Hyperlipidemia   . Hypertension   . Insomnia   . 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 MI (myocardial infarction) ([place]) 04/2019   had CPR by EMS  . OA (osteoarthritis)    hands  . OSA (obstructive sleep apnea)    No longer uses CPAP  . Persistent atrial fibrillation with rapid ventricular response ([place]) 02/05/2020   Past Surgical History:  Past Surgical History:  Procedure Laterality Date  . ANKLE SURGERY Left 2014   had rods put in   . BIOPSY  02/28/2019   Procedure: BIOPSY;  Surgeon: Milus Banister, MD;  Location: [facility] ENDOSCOPY;  Service: Endoscopy;;  . CARDIOVERSION  02/06/2020  . CARDIOVERSION N/A 02/06/2020   Procedure: CARDIOVERSION;  Surgeon: Werner Lean, MD;  Location: [facility] ENDOSCOPY;  Service: Cardiovascular;  Laterality: N/A;  . CARDIOVERSION N/A 02/24/2020   Procedure:  CARDIOVERSION;  Surgeon: Werner Lean, MD;  Location: [facility] ENDOSCOPY;  Service: Cardiovascular;  Laterality: N/A;  . [place]  . ESOPHAGOGASTRODUODENOSCOPY (EGD) WITH PROPOFOL N/A 02/28/2019   Procedure: ESOPHAGOGASTRODUODENOSCOPY (EGD) WITH PROPOFOL;  Surgeon: Milus Banister, MD;  Location: [facility] ENDOSCOPY;  Service: Endoscopy;  Laterality: N/A;  . HIP ARTHROPLASTY Left 04/01/2016   Procedure: ARTHROPLASTY BIPOLAR HIP (HEMIARTHROPLASTY);  Surgeon: Paralee Cancel, MD;  Location: [facility];  Service: Orthopedics;  Laterality: Left;  . HIP CLOSED REDUCTION Left 04/30/2016   Procedure: CLOSED REDUCTION HIP;  Surgeon: Wylene Simmer, MD;  Location: [facility];  Service: Orthopedics;  Laterality: Left;  . TONSILLECTOMY    . 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 Patient with recent admission 02/05/2020 to 02/07/2020 for A. fib with RVR during which she received cardioversion with hospital course complicated by electrolyte imbalance and pressure wounds followed by wound care nurse and he was discharged to a skilled nursing facility before returning home. Per chart review patient lives alone independent with assistive device.  Two-level home bed and bath on main level. Patient does drive and does his own grocery shopping. He does have family that check on him routinely. Patient did see his cardiologist 02/18/2020 and follow-up and again in atrial fibrillation with recommendations to begin flecainide and plan for DCCV 02/24/2020  presented 02/22/2020 for bouts of syncope. On arrival to the ED heart rate elevated 110s-130 potassium 3.5 magnesium 1.9 creatinine 1.1 hemoglobin 11.2 platelets 295,000 BNP 266 EKG atrial fibrillation with rate 145. Cardiology service follow-up is Toprol was discontinued. A follow-up echocardiogram ejection fraction of 60 to 65% no wall motion abnormalities. Patient again with episodes in and out of atrial fibrillation placed on amiodarone and adjusted he remains on Eliquis. He is tolerating a regular diet. Therapies have been ongoing with progressive gains. Patient was admitted for a comprehensive rehab program. Patient transferred to CIR on 03/06/2020 .   Patient currently requires min with mobility secondary to muscle weakness, decreased cardiorespiratoy endurance and decreased oxygen support, and decreased problem solving and decreased safety awareness.  Prior to hospitalization, patient was modified independent  with mobility and lived with Alone in a House home.  Home access is  Level entry.  Patient will benefit from skilled PT intervention to maximize safe functional mobility, minimize fall risk and decrease caregiver burden for planned discharge home with 24 hour supervision.  Anticipate patient will OP cardiac rehab vs OPPT at discharge.  PT - End of Session Activity Tolerance: Tolerates 10 - 20 min activity with multiple rests Endurance Deficit: Yes Endurance Deficit Description: Required moderate seated rest breaks b/w functional tasks 2/2 fatigue PT Assessment Rehab Potential (ACUTE/IP ONLY): Good PT Barriers to Discharge: Behavior;Decreased caregiver support PT Patient demonstrates impairments in the following area(s): Balance;Behavior;Edema;Motor;Safety;Skin Integrity PT Transfers Functional Problem(s): Bed Mobility;Bed to Chair;Car PT Locomotion Functional Problem(s): Ambulation;Stairs PT Plan PT Intensity: Minimum of 1-2 x/day ,45 to 90 minutes PT Frequency: 5 out of 7  days PT Duration Estimated Length of Stay: 7-10 days PT Treatment/Interventions: Ambulation/gait training;Community reintegration;Patient/family education;Stair training;UE/LE Coordination activities;UE/LE Strength taining/ROM;Splinting/orthotics;Pain management;DME/adaptive equipment instruction;Cognitive remediation/compensation;Balance/vestibular training;Neuromuscular re-education;Disease management/prevention;Skin care/wound management;Therapeutic Exercise;Wheelchair propulsion/positioning;Visual/perceptual remediation/compensation;Therapeutic Activities;Psychosocial support;Functional mobility training;Discharge planning PT Transfers Anticipated Outcome(s): mod I with LRAD PT Locomotion Anticipated Outcome(s): supervision with LRAD PT Recommendation Follow Up Recommendations: 24 hour supervision/assistance;Other (comment) (May benefit from OP cardiac rehab) Equipment Recommended: To be determined Equipment Details: Pt has: rollator and RW   PT Evaluation Precautions/Restrictions Precautions Precautions: Fall Precaution Comments: history of falls, impulsive, mild tremors BUE with intentional movements Restrictions Weight Bearing Restrictions: No General Chart Reviewed: Yes Additional Pertinent History: PMH hx bradycardic MI, hx falls at home, HLD, HTN, MDD, persistent A-fib with RVR, ankle surgery, cardioversion, B THAs (left in 2017, rignt anterior approach 6/21) Family/Caregiver Present: No Vital Signs Pain Pain Assessment Pain Scale: 0-10 Pain Score: 0-No pain Home Living/Prior Functioning Home Living Living Arrangements: Alone Available Help at Discharge: Family;Friend(s) Type of Home: House Home Access: Level entry Home Layout: Two level;Able to live on main level with bedroom/bathroom;Full bath on main level Alternate Level Stairs-Number of Steps: does not go upstairs Bathroom Shower/Tub: Multimedia programmer: Handicapped height Bathroom Accessibility: Yes   Lives With: Alone Prior Function Level of Independence: Independent with homemaking with ambulation;Independent with basic ADLs;Independent with transfers  Able to Take Stairs?: Yes Driving: Yes Vocation: Retired Comments: has been using RW and rollator at home; reports he manages his own medicines and bills Vision/Perception  Perception Perception: Within Functional Limits Praxis Praxis: Intact  Cognition Overall  Cognitive Status: Within Functional Limits for tasks assessed Arousal/Alertness: Awake/alert Orientation Level: Oriented X4 Attention: Sustained;Selective Sustained Attention: Appears intact Selective Attention: Appears intact Memory: Impaired Memory Impairment: Decreased short term memory Decreased Short Term Memory: Verbal basic Immediate Memory Recall: Sock;Blue;Bed Memory Recall Sock: Without Cue Memory Recall Blue: Without Cue Memory Recall Bed: With Cue Awareness: Appears intact Problem Solving: Appears intact Behaviors: Impulsive Safety/Judgment: Appears intact Sensation Sensation Light Touch: Appears Intact Hot/Cold: Appears Intact Proprioception: Appears Intact Stereognosis: Not tested Coordination Gross Motor Movements are Fluid and Coordinated: No Fine Motor Movements are Fluid and Coordinated: No Coordination and Movement Description: BUE intention tremors. Effortful movement that is exacerbated while multi-tasking Motor  Motor Motor: Within Functional Limits Motor - Skilled Clinical Observations: Symmetrical strength in x4 extremities, general deconditioning   Trunk/Postural Assessment  Cervical Assessment Cervical Assessment: Exceptions to [facility] (forward head  Simultaneous filing. User may not have seen previous data.) Thoracic Assessment Thoracic Assessment: Exceptions to [facility] (thoracic kyphosis - mild  Simultaneous filing. User may not have seen previous data.) Lumbar Assessment Lumbar Assessment: Exceptions to [facility] (sacral sitting and  posterior pelvic tilt  Simultaneous filing. User may not have seen previous data.) Postural Control Postural Control: Deficits on evaluation (Mild Posterior lean  Simultaneous filing. User may not have seen previous data.)  Balance Balance Balance Assessed: Yes (static sitting: mod I; dynamic sitting: SBA; static standing: CGA; dynamic standing: CGA  Simultaneous filing. User may not have seen previous data.) Static Sitting Balance Static Sitting - Balance Support: Feet supported;No upper extremity supported Static Sitting - Level of Assistance: 5: Stand by assistance Dynamic Sitting Balance Dynamic Sitting - Balance Support: During functional activity;Feet supported Dynamic Sitting - Level of Assistance: 4: Min assist Static Standing Balance Static Standing - Balance Support: No upper extremity supported;During functional activity Static Standing - Level of Assistance: 4: Min assist Dynamic Standing Balance Dynamic Standing - Balance Support: Bilateral upper extremity supported;During functional activity Dynamic Standing - Level of Assistance: 4: Min assist Extremity Assessment  RUE Assessment RUE Assessment: Within Functional Limits General Strength Comments: Grossly 4+/5 LUE Assessment LUE Assessment: Within Functional Limits General Strength Comments: Grossly 4+/5 RLE Assessment RLE Assessment: Within Functional Limits General Strength Comments: Grossly 4+/5 LLE Assessment LLE Assessment: Within Functional Limits General Strength Comments: Grossly 4+/5  Care Tool Care Tool Bed Mobility Roll left and right activity   Roll left and right assist level: Minimal Assistance - Patient > 75%    Sit to lying activity   Sit to lying assist level: Minimal Assistance - Patient > 75%    Lying to sitting edge of bed activity   Lying to sitting edge of bed assist level: Minimal Assistance - Patient > 75%     Care Tool Transfers Sit to stand transfer   Sit to stand assist level:  Minimal Assistance - Patient > 75%    Chair/bed transfer   Chair/bed transfer assist level: Minimal Assistance - Patient > 75%     Toilet transfer   Assist Level: Contact Guard/Touching assist    Car transfer   Car transfer assist level: Minimal Assistance - Patient > 75%      Care Tool Locomotion Ambulation   Assist level: Minimal Assistance - Patient > 75% Assistive device: Walker-rolling Max distance: 124f  Walk 10 feet activity   Assist level: Minimal Assistance - Patient > 75% Assistive device: Walker-rolling   Walk 50 feet with 2 turns activity   Assist level: Minimal Assistance - Patient > 75% Assistive  device: Walker-rolling  Walk 150 feet activity Walk 150 feet activity did not occur: Safety/medical concerns (fatigue)      Walk 10 feet on uneven surfaces activity Walk 10 feet on uneven surfaces activity did not occur: Safety/medical concerns      Stairs   Assist level: Minimal Assistance - Patient > 75% Stairs assistive device: 2 hand rails Max number of stairs: 4  Walk up/down 1 step activity   Walk up/down 1 step (curb) assist level: Minimal Assistance - Patient > 75% Walk up/down 1 step or curb assistive device: 2 hand rails    Walk up/down 4 steps activity Walk up/down 4 steps assist level: Minimal Assistance - Patient > 75% Walk up/down 4 steps assistive device: 2 hand rails  Walk up/down 12 steps activity Walk up/down 12 steps activity did not occur: Safety/medical concerns (fatigue)      Pick up small objects from floor Pick up small object from the floor (from standing position) activity did not occur: Safety/medical concerns      Wheelchair Will patient use wheelchair at discharge?: No          Wheel 50 feet with 2 turns activity      Wheel 150 feet activity        Refer to Care Plan for Long Term Goals  SHORT TERM GOAL WEEK 1 PT Short Term Goal 1 (Week 1): STG = LTG due to ELOS  Recommendations for other services: None   Skilled  Therapeutic Intervention Mobility Bed Mobility Bed Mobility: Rolling Right;Rolling Left;Supine to Sit;Sit to Supine Rolling Right: Minimal Assistance - Patient > 75% Rolling Left: Minimal Assistance - Patient > 75% Supine to Sit: Minimal Assistance - Patient > 75% Sit to Supine: Minimal Assistance - Patient > 75% Transfers Transfers: Sit to Stand;Stand Pivot Transfers;Stand to Sit Sit to Stand: Contact Guard/Touching assist Stand to Sit: Contact Guard/Touching assist Stand Pivot Transfers: Contact Guard/Touching assist Stand Pivot Transfer Details: Verbal cues for gait pattern;Verbal cues for precautions/safety;Verbal cues for technique;Verbal cues for sequencing;Visual cues/gestures for sequencing;Tactile cues for weight shifting;Tactile cues for posture Transfer (Assistive device): Rolling walker Locomotion  Gait Ambulation: Yes Gait Assistance: Minimal Assistance - Patient > 75% Gait Distance (Feet): 100 Feet Assistive device: Rolling walker Gait Assistance Details: Manual facilitation for placement;Verbal cues for precautions/safety;Verbal cues for technique;Verbal cues for safe use of DME/AE;Verbal cues for sequencing;Visual cues/gestures for sequencing;Verbal cues for gait pattern;Tactile cues for posture Gait Gait: Yes Gait Pattern: Impaired Gait Pattern: Poor foot clearance - right;Poor foot clearance - left;Trunk flexed;Decreased step length - left;Decreased step length - right;Decreased stride length Gait velocity: decreased Stairs / Additional Locomotion Stairs: Yes Stairs Assistance: Minimal Assistance - Patient > 75% Stair Management Technique: Two rails Number of Stairs: 4 Height of Stairs: 6 Wheelchair Mobility Wheelchair Mobility: No  Skilled intervention: 1st session: Pt received supine in bed, awake and agreeable to PT session. Functional mobility initiated as outlined above. Requires minA for bed mobility, minA sit<>stand transfers with minA and RW, car  transfers with minA and RW, ambulated ~154f with minA and RW, and negotiated up/down x4 steps with minA and B HR support. Gait deficits include forward trunk lean, dyspnea on exertion, decreased B foot clearance, and downward gaze. SpO2 reading 97% after gait and HR fluctuating b/w 55-100, pt asymptomatic throughout other than generalized fatigue. Pt pleasant and receptive to therapy intervention/education although he does have poor insight into his deficits. He is motivated to return home as soon as he can.  Instructed pt in results of PT evaluation as detailed above, PT POC, rehab potential, rehab goals, and discharge recommendations. Additionally discussed CIR's policies regarding fall safety and use of chair alarm and/or quick release belt. Pt verbalized understanding and in agreement. Will update pt's family members as they become available.   2nd session: Pt received supine in bed, agreeable to PT session without c/o pain. Supine<>sit with CGA with bed features. Stand<>pivot with minA and no AD from EOB to w/c. Pt instructed to propell himself using BLE"s only on level surfaces in w/c, able to negotiate ~162f prior to fatigue, requiring brief seated rest break. WC transport remaining distance to main therapy gym. Stand<>pivot with minA from w/c to mat table, cues for safety approach, sequencing. Pt performed BERG, scoring 29/56, indicating increased falls risk. After completing BERG, pt requesting need for urgent toileting. Stand<>pivot with minA and no AD back to his WC and WC transport to return to his room. He ambulated ~11fwith CGA and RW to 3-1 BSC that was placed over toilet, required minA for controlled lowering. Pt continent of bowel and bladder, charted. Ambulated back to his chair with CGA and RW, about [id number] transport to dayroom gym for time management He then performed TUG x3 trials with RW, with average score of 27 seconds. Pt then performed Nustep x10 minutes at workload of 4 for  cardovascular endurance as well as general strengthening. Resting vitals assessed at 97% oxygen on RA and 72 HR. Vitals after completion reading 95 % oxygen on RA and 85 HR. Ambulated ~16f51fth CGA and RW back to his w/c and WC transport to return to his room for time management. Performed stand<>step transfer with CGA and RW back to bed. Sit>Supine with supervision with use of bed features. He remained semi-reclined in bed at end of session with needs in reach, bed alarm on.  Patient demonstrates increased fall risk as noted by score of 29/56 on Berg Balance Scale.  (<36= high risk for falls, close to 100%; 37-45 significant >80%; 46-51 moderate >50%; 52-55 lower >25%)  Discharge Criteria: Patient will be discharged from PT if patient refuses treatment 3 consecutive times without medical reason, if treatment goals not met, if there is a change in medical status, if patient makes no progress towards goals or if patient is discharged from hospital.  The above assessment, treatment plan, treatment alternatives and goals were discussed and mutually agreed upon: by patient  ChriAlger Simons DPT 03/06/2020, 12:50 PM

## 2020-03-06 NOTE — Evaluation (Signed)
Occupational Therapy Assessment and Plan  Patient Details  Name: Darryl Diaz MRN: [id number] Date of Birth: August 14, 1940  OT Diagnosis: muscle weakness (generalized) and unsteadiness on feet Rehab Potential: Rehab Potential (ACUTE ONLY): Excellent ELOS: 7-10 days   Today's Date: 03/06/2020 OT Individual Time: 1000-1100 OT Individual Time Calculation (min): 60 min     Hospital Problem: Active Problems:   Debility   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 (HCC)   Allergies   Anemia, chronic disease   Chronic diastolic congestive heart failure ([place])   History of hypertension   Atrial fibrillation with RVR ([place])   Past Medical History:  Past Medical History:  Diagnosis Date  . Alcoholism ([place])    in remission following wife's death  . Anemia   . Bradycardia    low 20's  . Cardiac arrest ([place]) 04/20/2019   12 min CPR with epi  . Dysrhythmia   . Fall at home 03/28/2016  . Fatty liver   . GERD (gastroesophageal reflux disease)   . H/O seasonal allergies   . History of GI bleed   . Hx of adenomatous colonic polyps 08/10/2017  . Hyperlipidemia   . Hypertension   . Insomnia   . 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 MI (myocardial infarction) ([place]) 04/2019   had CPR by EMS  . OA (osteoarthritis)    hands  . OSA (obstructive sleep apnea)    No longer uses CPAP  . Persistent atrial fibrillation with rapid ventricular response ([place]) 02/05/2020   Past Surgical History:  Past Surgical History:  Procedure Laterality Date  . ANKLE SURGERY Left 2014   had rods put in   . BIOPSY  02/28/2019   Procedure: BIOPSY;  Surgeon: Milus Banister, MD;  Location: [facility] ENDOSCOPY;  Service: Endoscopy;;  . CARDIOVERSION  02/06/2020  . CARDIOVERSION N/A 02/06/2020   Procedure: CARDIOVERSION;  Surgeon: Werner Lean, MD;  Location: [facility] ENDOSCOPY;  Service: Cardiovascular;  Laterality: N/A;  . CARDIOVERSION N/A 02/24/2020   Procedure: CARDIOVERSION;   Surgeon: Werner Lean, MD;  Location: [place] ENDOSCOPY;  Service: Cardiovascular;  Laterality: N/A;  . [place]  . ESOPHAGOGASTRODUODENOSCOPY (EGD) WITH PROPOFOL N/A 02/28/2019   Procedure: ESOPHAGOGASTRODUODENOSCOPY (EGD) WITH PROPOFOL;  Surgeon: Milus Banister, MD;  Location: [facility] ENDOSCOPY;  Service: Endoscopy;  Laterality: N/A;  . HIP ARTHROPLASTY Left 04/01/2016   Procedure: ARTHROPLASTY BIPOLAR HIP (HEMIARTHROPLASTY);  Surgeon: Paralee Cancel, MD;  Location: [place];  Service: Orthopedics;  Laterality: Left;  . HIP CLOSED REDUCTION Left 04/30/2016   Procedure: CLOSED REDUCTION HIP;  Surgeon: Wylene Simmer, MD;  Location: [place];  Service: Orthopedics;  Laterality: Left;  . TONSILLECTOMY    . 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 Patient with recent admission 02/05/2020 to 02/07/2020 for A. fib with RVR during which she received cardioversion with hospital course complicated by electrolyte imbalance and pressure wounds followed by wound care nurse and he was discharged to a skilled nursing facility before returning home. Per chart review patient lives alone independent with assistive device. Two-level  home bed and bath on main level. Patient does drive and does his own grocery shopping. He does have family that check on him routinely. Patient did see his cardiologist 02/18/2020 and follow-up and again in atrial fibrillation with recommendations to begin flecainide and plan for DCCV 02/24/2020  presented 02/22/2020 for bouts of syncope. On arrival to the ED heart rate elevated 110s-130 potassium 3.5 magnesium 1.9 creatinine 1.1 hemoglobin 11.2 platelets 295,000 BNP 266 EKG atrial fibrillation with rate 145. Cardiology service follow-up is Toprol was discontinued. A follow-up echocardiogram ejection fraction of 60 to 65% no wall motion abnormalities. Patient again with episodes in and out of atrial fibrillation placed on amiodarone and adjusted he remains on Eliquis.  Patient transferred to CIR on 03/06/2020 .    Patient currently requires min with basic self-care skills and IADL secondary to muscle weakness, decreased coordination and decreased sitting balance, decreased standing balance and decreased balance strategies.  Prior to hospitalization, patient could complete ADLs and IADLs with modified independent .  Patient will benefit from skilled intervention to increase independence with basic self-care skills prior to discharge home with care partner.  Anticipate patient will require intermittent supervision and no further OT follow recommended.  OT - End of Session Activity Tolerance: Tolerates 30+ min activity with multiple rests Endurance Deficit: Yes Endurance Deficit Description: mild shortness of breath compleing LB self care OT Assessment Rehab Potential (ACUTE ONLY): Excellent OT Patient demonstrates impairments in the following area(s): Balance;Perception;Behavior;Safety;Cognition;Skin Integrity;Endurance;Motor OT Basic ADL's Functional Problem(s): Grooming;Bathing;Dressing;Toileting OT Transfers Functional Problem(s): Toilet;Tub/Shower OT Plan OT Intensity: Minimum of 1-2 x/day, 45 to 90 minutes OT Frequency: 5 out of 7 days OT Duration/Estimated Length of Stay: 7-10 days OT Treatment/Interventions: Balance/vestibular training;Discharge planning;Self Care/advanced ADL retraining;Therapeutic Activities;UE/LE Coordination activities;Cognitive  remediation/compensation;Disease mangement/prevention;Functional mobility training;Patient/family education;Skin care/wound managment;Therapeutic Exercise;DME/adaptive equipment instruction;Neuromuscular re-education;Psychosocial support;UE/LE Strength taining/ROM OT Self Feeding Anticipated Outcome(s): independent OT Basic Self-Care Anticipated Outcome(s): supervision-mod I OT Toileting Anticipated Outcome(s): mod I OT Bathroom Transfers Anticipated Outcome(s): mod I OT Recommendation Patient destination: Home Follow Up Recommendations: Other (comment) (intermittent supervision from family) Equipment Recommended: To be determined Equipment Details: Pt reports he already has shower chair, grab bars for toilet, RW, rollator, w/c, cane, sock aide, reacher, long handled sponge   OT Evaluation Precautions/Restrictions  Precautions Precautions: Fall Precaution Comments: history of falls, impulsive, mild tremors BUE with intentional movements Restrictions Weight Bearing Restrictions: No General Chart Reviewed: Yes Pain Pain Assessment Pain Scale: 0-10 Pain Score: 0-No pain Home Living/Prior Functioning Home Living Family/patient expects to be discharged to:: Private residence Living Arrangements: Alone Available Help at Discharge: Family, Friend(s) Type of Home: House Home Access: Level entry Home Layout: Two level, Able to live on main level with bedroom/bathroom, Full bath on main level Alternate Level Stairs-Number of Steps: does not go upstairs Bathroom Shower/Tub: Multimedia programmer: Handicapped height Bathroom Accessibility: Yes  Lives With: Alone Prior Function Level of Independence: Independent with homemaking with ambulation, Independent with basic ADLs, Independent with transfers  Able to Take Stairs?: Yes Driving: Yes Vocation: Retired Comments: has been using RW and rollator at home; reports he manages his own medicines and bills Vision Baseline  Vision/History: Wears glasses Wears Glasses: At all times Patient Visual Report: Blurring of vision (pt reports having retinal detachment repaired 3 months prior and needs to have prescription glasses reassessed.) Vision Assessment?: No apparent visual deficits Perception  Perception: Within Functional Limits Praxis Praxis: Intact Cognition Overall Cognitive Status: Within Functional Limits for tasks assessed Arousal/Alertness: Awake/alert Orientation  Level: Person;Place;Situation Person: Oriented Place: Oriented Situation: Oriented Year: 2021 Month: October Day of Week: Correct Memory: Impaired Memory Impairment: Decreased short term memory Decreased Short Term Memory: Verbal basic Immediate Memory Recall: Sock;Blue;Bed Memory Recall Sock: Without Cue Memory Recall Blue: Without Cue Memory Recall Bed: With Cue Attention: Sustained;Selective Sustained Attention: Appears intact Selective Attention: Appears intact Awareness: Appears intact Problem Solving: Appears intact Behaviors: Impulsive Safety/Judgment: Appears intact Sensation Sensation Light Touch: Appears Intact Hot/Cold: Appears Intact Proprioception: Appears Intact Stereognosis: Not tested Coordination Gross Motor Movements are Fluid and Coordinated: No Fine Motor Movements are Fluid and Coordinated: No Coordination and Movement Description: BUE intention tremors. Effortful movement that is exacerbated while multi-tasking Motor  Motor Motor: Within Functional Limits Motor - Skilled Clinical Observations: Symmetrical strength in x4 extremities, general deconditioning  Trunk/Postural Assessment  Cervical Assessment Cervical Assessment: Exceptions to [facility] (forward head  Simultaneous filing. User may not have seen previous data.) Thoracic Assessment Thoracic Assessment: Exceptions to [facility] (thoracic kyphosis - mild  Simultaneous filing. User may not have seen previous data.) Lumbar Assessment Lumbar Assessment:  Exceptions to [facility] (sacral sitting and posterior pelvic tilt  Simultaneous filing. User may not have seen previous data.) Postural Control Postural Control: Deficits on evaluation (Mild Posterior lean  Simultaneous filing. User may not have seen previous data.)  Balance Balance Balance Assessed: Yes (static sitting: mod I; dynamic sitting: SBA; static standing: CGA; dynamic standing: CGA  Simultaneous filing. User may not have seen previous data.) Static Sitting Balance Static Sitting - Balance Support: Feet supported;No upper extremity supported Static Sitting - Level of Assistance: 5: Stand by assistance Dynamic Sitting Balance Dynamic Sitting - Balance Support: During functional activity;Feet supported Dynamic Sitting - Level of Assistance: 4: Min assist Static Standing Balance Static Standing - Balance Support: No upper extremity supported;During functional activity Static Standing - Level of Assistance: 4: Min assist Dynamic Standing Balance Dynamic Standing - Balance Support: Bilateral upper extremity supported;During functional activity Dynamic Standing - Level of Assistance: 4: Min assist Extremity/Trunk Assessment RUE Assessment RUE Assessment: Within Functional Limits General Strength Comments: Grossly 4+/5 LUE Assessment LUE Assessment: Within Functional Limits General Strength Comments: Grossly 4+/5  Care Tool Care Tool Self Care Eating   Eating Assist Level: Independent    Oral Care    Oral Care Assist Level: Supervision/Verbal cueing (standing sinkside)    Bathing   Body parts bathed by patient: Right arm;Left arm;Chest;Abdomen;Front perineal area;Buttocks;Right upper leg;Left upper leg;Face;Left lower leg;Right lower leg     Assist Level: Contact Guard/Touching assist    Upper Body Dressing(including orthotics)   What is the patient wearing?: Pull over shirt   Assist Level: Set up assist    Lower Body Dressing (excluding footwear)   What is the patient  wearing?: Pants Assist for lower body dressing: Supervision/Verbal cueing    Putting on/Taking off footwear   What is the patient wearing?: Shoes;Socks Assist for footwear: Set up assist       Care Tool Toileting Toileting activity   Assist for toileting: Contact Guard/Touching assist     Care Tool Bed Mobility Roll left and right activity   Roll left and right assist level: Minimal Assistance - Patient > 75%    Sit to lying activity   Sit to lying assist level: Minimal Assistance - Patient > 75%    Lying to sitting edge of bed activity   Lying to sitting edge of bed assist level: Minimal Assistance - Patient > 75%     Care Tool Transfers  Sit to stand transfer   Sit to stand assist level: Minimal Assistance - Patient > 75%    Chair/bed transfer   Chair/bed transfer assist level: Minimal Assistance - Patient > 75%     Toilet transfer   Assist Level: Contact Guard/Touching assist     Care Tool Cognition Expression of Ideas and Wants Expression of Ideas and Wants: Without difficulty (complex and basic) - expresses complex messages without difficulty and with speech that is clear and easy to understand   Understanding Verbal and Non-Verbal Content Understanding Verbal and Non-Verbal Content: Understands (complex and basic) - clear comprehension without cues or repetitions   Memory/Recall Ability *first 3 days only Memory/Recall Ability *first 3 days only: Current season;Location of own room;Staff names and faces;That he or she is in a hospital/hospital unit    Refer to Care Plan for [place] 1 OT Short Term Goal 1 (Week 1): STGs=LTGs due to ELOS  Recommendations for other services: None    Skilled Therapeutic Intervention ADL ADL Grooming: Supervision/safety Where Assessed-Grooming: Standing at sink Upper Body Bathing: Minimal assistance Where Assessed-Upper Body Bathing: Sitting at sink Lower Body Bathing: Contact guard;Minimal  cueing Where Assessed-Lower Body Bathing: Standing at sink;Sitting at sink Upper Body Dressing: Setup Where Assessed-Upper Body Dressing: Sitting at sink Lower Body Dressing: Contact guard;Minimal cueing Where Assessed-Lower Body Dressing: Sitting at sink;Standing at sink Toilet Transfer: Contact guard Toilet Transfer Method: Counselling psychologist: Other (comment) (3 in 1 commode over toilet) Mobility  Bed Mobility Bed Mobility: Rolling Right;Rolling Left;Supine to Sit;Sit to Supine Rolling Right: Minimal Assistance - Patient > 75% Rolling Left: Minimal Assistance - Patient > 75% Supine to Sit: Minimal Assistance - Patient > 75% Sit to Supine: Minimal Assistance - Patient > 75% Transfers Sit to Stand: Contact Guard/Touching assist Stand to Sit: Contact Guard/Touching assist   Skilled Intervention: Pt sitting up in recliner, no c/o pain, agreeable to OT session.  Initial evaluation completed, collaborated with pt regarding POC, initial dc planning discussed, and reviewed with pt regarding rehab prognosis.  Pt completed self care and functional mobility per above levels of assist.  Assessed dynamic standing balance using functional reach test.  Pt able to reach in standing unsupported average of 3 inches without LOB.  Pt is currently at significant risk for falls per functional reach score.  Pt returned to recliner with call bell in reach, seat pad alarm on.   Discharge Criteria: Patient will be discharged from OT if patient refuses treatment 3 consecutive times without medical reason, if treatment goals not met, if there is a change in medical status, if patient makes no progress towards goals or if patient is discharged from hospital.  The above assessment, treatment plan, treatment alternatives and goals were discussed and mutually agreed upon: by patient  [name] 03/06/2020, 12:54 PM

## 2020-03-06 NOTE — Progress Notes (Signed)
Inpatient Rehabilitation Medication Review by a Pharmacist  A complete drug regimen review was completed for this patient to identify any potential clinically significant medication issues.  Clinically significant medication issues were identified:  yes   Type of Medication Issue Identified Description of Issue Urgent (address now) Non-Urgent (address on AM team rounds) Plan Plan Accepted by Provider? (Yes / No / Pending AM Rounds)  Drug Interaction(s) (clinically significant)       Duplicate Therapy       Allergy       No Medication Administration End Date       Incorrect Dose  Cardiology note states to continue amiodarone 400 mg BID x 1 more week, and start amiodarone 200 mg daily on November 5th, was entered to start on Nov. 3rd Urgent Adjust dates Yes  Additional Drug Therapy Needed  Trazodone, tamsulosin, MVI, Lasix prn Non-urgent Restart as appropriate Pend AM rounds  Other         Name of provider notified for urgent issues identified: Lauraine Rinne, PA  Provider Method of Notification: secure chat   For non-urgent medication issues to be resolved on team rounds tomorrow morning a [facility] Secure Chat Handoff was sent to: Lauraine Rinne, [place]   Pharmacist comments: Amiodarone dates adjusted, please restart other medications as appropriate.  Time spent performing this drug regimen review (minutes):  60    Thank you for allowing [place] to participate in this patients care.   Jens Som, PharmD Please see amion for complete clinical pharmacist phone list. 03/06/2020 1:04 PM

## 2020-03-06 NOTE — H&P (Signed)
Physical Medicine and Rehabilitation Admission H&P        Chief Complaint  Patient presents with  . Loss of Consciousness  : HPI: Darryl Diaz is a 79 year old right-handed male with history of hypertension, hyperlipidemia, depression, tobacco abuse, atrial fibrillation maintained on Eliquis and question medical compliance, bradycardia arrest 04/2019, GI bleed secondary to Mallory-Weiss tear, OSA not on CPAP.  Patient with recent admission 02/05/2020 to 02/07/2020 for A. fib with RVR during which she received cardioversion with hospital course complicated by electrolyte imbalance and pressure wounds followed by wound care nurse and he was discharged to a skilled nursing facility before returning home.  Per chart review patient lives alone independent with assistive device.  Two-level home bed and bath on main level.  Patient does drive and does his own grocery shopping.  He does have family that check on him routinely.  Patient did see his cardiologist 02/18/2020 and follow-up and again in atrial fibrillation with recommendations to begin flecainide and plan for DCCV 02/24/2020 presented 02/22/2020 for bouts of syncope.  On arrival to the ED heart rate elevated 110s-130 potassium 3.5 magnesium 1.9 creatinine 1.1 hemoglobin 11.2 platelets 295,000 BNP 266 EKG atrial fibrillation with rate 145.  Cardiology service follow-up is Toprol was discontinued.  A follow-up echocardiogram ejection fraction of 60 to 65% no wall motion abnormalities.  Patient again with episodes in and out of atrial fibrillation placed on amiodarone and adjusted he remains on Eliquis.  He is tolerating a regular diet.  Therapies have been ongoing with progressive gains.  Patient was admitted for a comprehensive rehab program.     LBM this AM- going to bathroom with assistance Voiding well Takes claritin at home- needs it Rash on inner legs- needs nystatin powder for this.      Review of Systems  Constitutional:  Positive for malaise/fatigue. Negative for fever.  HENT: Negative for hearing loss.   Eyes: Negative for blurred vision and double vision.  Respiratory: Positive for shortness of breath. Negative for cough.   Cardiovascular: Positive for palpitations and leg swelling. Negative for chest pain.  Gastrointestinal: Positive for constipation. Negative for heartburn, nausea and vomiting.       GERD  Genitourinary: Negative for dysuria, flank pain and hematuria.  Musculoskeletal: Positive for falls, joint pain and myalgias.  Skin: Negative for rash.  Neurological: Positive for dizziness and weakness.       Syncope  Psychiatric/Behavioral: Positive for depression. The patient has insomnia.   All other systems reviewed and are negative.   Past Medical History:  Diagnosis Date  . 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 Anemia    . Bradycardia      low 20's  . Cardiac arrest ([place]) 04/20/2019    12 min CPR with epi  . Dysrhythmia    . Fall at home 03/28/2016  . Fatty liver    . GERD (gastroesophageal reflux disease)    . H/O seasonal allergies    . History of GI bleed    . Hx of adenomatous colonic polyps 08/10/2017  . Hyperlipidemia    . Hypertension    . Insomnia    . Major depressive disorder      following wife's death  . MI (myocardial infarction) ([place]) 04/2019    had CPR by EMS  . OA (osteoarthritis)      hands  . OSA (obstructive sleep apnea)      No longer  uses CPAP  . Persistent atrial fibrillation with rapid ventricular response ([place]) 02/05/2020    Past Surgical History:  Procedure Laterality Date  . ANKLE SURGERY Left 2014    had rods put in   . BIOPSY   02/28/2019    Procedure: BIOPSY;  Surgeon: Milus Banister, MD;  Location: [facility] ENDOSCOPY;  Service: Endoscopy;;  . CARDIOVERSION   02/06/2020  . CARDIOVERSION N/A 02/06/2020    Procedure: CARDIOVERSION;  Surgeon: Werner Lean, MD;  Location: [facility] ENDOSCOPY;  Service: Cardiovascular;   Laterality: N/A;  . CARDIOVERSION N/A 02/24/2020    Procedure: CARDIOVERSION;  Surgeon: Werner Lean, MD;  Location: [facility] ENDOSCOPY;  Service: Cardiovascular;  Laterality: N/A;  . [place]  . ESOPHAGOGASTRODUODENOSCOPY (EGD) WITH PROPOFOL N/A 02/28/2019    Procedure: ESOPHAGOGASTRODUODENOSCOPY (EGD) WITH PROPOFOL;  Surgeon: Milus Banister, MD;  Location: [facility] ENDOSCOPY;  Service: Endoscopy;  Laterality: N/A;  . HIP ARTHROPLASTY Left 04/01/2016    Procedure: ARTHROPLASTY BIPOLAR HIP (HEMIARTHROPLASTY);  Surgeon: Paralee Cancel, MD;  Location: [place];  Service: Orthopedics;  Laterality: Left;  . HIP CLOSED REDUCTION Left 04/30/2016    Procedure: CLOSED REDUCTION HIP;  Surgeon: Wylene Simmer, MD;  Location: [place];  Service: Orthopedics;  Laterality: Left;  . TONSILLECTOMY      . TOTAL HIP ARTHROPLASTY Right 10/15/2019    Procedure: TOTAL HIP ARTHROPLASTY ANTERIOR APPROACH;  Surgeon: Paralee Cancel, MD;  Location: [facility];  Service: Orthopedics;  Laterality: Right;  70 mins         Family History  Problem Relation Age of Onset  . Heart disease Mother 54  . Hypertension Mother    . Arthritis Mother    . Heart failure Father 76  . Stroke Maternal Aunt    . Heart failure Sister    . Colon cancer Neg Hx    . Esophageal cancer Neg Hx    . Stomach cancer Neg Hx    . Rectal cancer Neg Hx      Social History:  reports that he has quit smoking. His smoking use included cigarettes. He has never used smokeless tobacco. He reports that he does not drink alcohol and does not use drugs. Allergies:       Allergies  Allergen Reactions  . Penicillins Other (See Comments)      Tolerated Cephalosporin Date: 10/15/19. Allergy is from childhood Did it involve swelling of the face/tongue/throat, SOB, or low BP? Unknown Did it involve sudden or severe rash/hives, skin peeling, or any reaction on the inside of your mouth or nose? Unknown Did you need to seek medical attention at a  hospital or doctor's office? Unknown When did it last happen?    childhood reaction   If all above answers are "NO", may proceed with cephalosporin use.          Medications Prior to Admission  Medication Sig Dispense Refill  . apixaban (ELIQUIS) 5 MG TABS tablet Take 1 tablet (5 mg total) by mouth 2 (two) times daily. 60 tablet 0  . atorvastatin (LIPITOR) 20 MG tablet Take 1 tablet (20 mg total) by mouth daily. 30 tablet 0  . escitalopram (LEXAPRO) 10 MG tablet Take 1 tablet (10 mg total) by mouth daily. 30 tablet 0  . ferrous sulfate 325 (65 FE) MG tablet Take 1 tablet (325 mg total) by mouth 2 (two) times daily with a meal. 60 tablet 0  . flecainide (TAMBOCOR) 100 MG tablet Take 1  tablet (100 mg total) by mouth 2 (two) times daily. 951 tablet 3  . folic acid (FOLVITE) 1 MG tablet Take 1 tablet (1 mg total) by mouth daily. 30 tablet 0  . furosemide (LASIX) 40 MG tablet Take 1 tablet (40 mg total) by mouth daily as needed for fluid (weight gain). (Patient taking differently: Take 40 mg by mouth daily as needed for fluid or edema (or weight gain). ) 30 tablet 0  . loratadine (CLARITIN) 10 MG tablet Take 1 tablet (10 mg total) by mouth daily. 30 tablet 0  . metoprolol succinate (TOPROL-XL) 25 MG 24 hr tablet Take 50 mg by mouth daily.      . Multiple Vitamins-Minerals (CENTRUM SILVER 50+MEN) TABS Take 1 tablet by mouth daily with breakfast.      . pantoprazole (PROTONIX) 40 MG tablet Take 1 tablet (40 mg total) by mouth daily. (Patient taking differently: Take 40 mg by mouth daily before breakfast. ) 30 tablet 0  . potassium chloride SA (KLOR-CON) 20 MEQ tablet Take 2 tablets (40 mEq total) by mouth daily. 60 tablet 0  . tamsulosin (FLOMAX) 0.4 MG CAPS capsule Take 0.4 mg by mouth every evening.      . Thiamine HCl (VITAMIN B-1) 250 MG tablet Take 1 tablet (250 mg total) by mouth daily. 30 tablet 0  . traZODone (DESYREL) 50 MG tablet Take 1 tablet (50 mg total) by mouth at bedtime. 30 tablet 0    . magnesium oxide (MAG-OX) 400 (241.3 Mg) MG tablet Take 1 tablet (400 mg total) by mouth daily. 90 tablet 3  . metoprolol succinate (TOPROL-XL) 50 MG 24 hr tablet Take 1 tablet (50 mg total) by mouth daily. Take with or immediately following a meal. 90 tablet 3      Drug Regimen Review Drug regimen was reviewed and remains appropriate with no significant issues identified   Home: Home Living Family/patient expects to be discharged to:: Private residence Living Arrangements: Alone Available Help at Discharge: Family, Available PRN/intermittently, Neighbor (sister in [place] and she can check on him) Type of Home: Other(Comment) (townhome) Home Access: Level entry Home Layout: Two level, Able to live on main level with bedroom/bathroom, Full bath on main level Alternate Level Stairs-Number of Steps: does not go upstairs Bathroom Shower/Tub: Multimedia programmer: Handicapped height Bathroom Accessibility: Yes Home Equipment: Environmental consultant - 2 wheels, Environmental consultant - 4 wheels, Bedside commode, Wheelchair - manual, Shower seat - built in, [employer] held shower head, Radiation protection practitioner Equipment: Long-handled shoe horn Additional Comments: pt drives and does his own grocery shopping. patient reports prior to COVID he was swimming 2-3x/week at [facility] and would like to get back to that.   Functional History: Prior Function Level of Independence: Independent with assistive device(s) Comments: has been using RW and rollator at home; reports he manages his own medicines and bills   Functional Status:  Mobility: Bed Mobility Overal bed mobility: Needs Assistance Bed Mobility: Supine to Sit Supine to sit: Min guard Sit to supine: Min assist General bed mobility comments: increased effort to come to EoB Transfers Overall transfer level: Needs assistance Equipment used: Rolling walker (2 wheeled) Transfers: Sit to/from Stand Sit to Stand: Min guard Stand pivot transfers: Max assist General  transfer comment: min guard for safety, pt with good power up and steadying, however needs increased cuing for reaching back to chair and controlling his descent  Ambulation/Gait Ambulation/Gait assistance: Min assist, Mod assist Gait Distance (Feet): 50 Feet (requires 2x standing rest breaks  due to DoE) Assistive device: Rolling walker (2 wheeled) Gait Pattern/deviations: Step-through pattern, Decreased step length - right, Decreased step length - left, Shuffle General Gait Details: min A for steadying with 2x LoB posteriorly equiring Mod A for steadying, pt with poor pleth wave form with ambulation and despite 4/4 DoE when pleth is good SaO2 >90% Gait velocity: slowed Gait velocity interpretation: <1.8 ft/sec, indicate of risk for recurrent falls   ADL: ADL Overall ADL's : Needs assistance/impaired Eating/Feeding: Independent, Sitting Grooming: Wash/dry hands, Standing, Min guard, Brushing hair Grooming Details (indicate cue type and reason): EOB Upper Body Bathing: Minimal assistance, Sitting Lower Body Bathing: Sitting/lateral leans, Sit to/from stand, Moderate assistance Upper Body Dressing : Minimal assistance, Sitting Lower Body Dressing: Set up, Sitting/lateral leans Lower Body Dressing Details (indicate cue type and reason): donned slip on shoes Toilet Transfer: Minimal assistance, Ambulation, RW, Regular Toilet, Grab bars Toilet Transfer Details (indicate cue type and reason): attempted to stand from EOB x4 before success with min A, unsafe hand placement and decreased safety awareness.  Toileting- Water quality scientist and Hygiene: Min guard, Sit to/from Sales promotion account executive Details (indicate cue type and reason): able to complete peri-care independently Functional mobility during ADLs: Minimal assistance, Rolling walker General ADL Comments: pt motivated to go to the bathroom and ambulate to date, continues to make steady progress    Cognition: Cognition Overall Cognitive Status: Impaired/Different from baseline Orientation Level: Oriented X4 Cognition Arousal/Alertness: Awake/alert Behavior During Therapy: Anxious, Impulsive Overall Cognitive Status: Impaired/Different from baseline Area of Impairment: Attention, Memory, Following commands, Safety/judgement, Awareness, Problem solving Current Attention Level: Selective, Alternating Memory: Decreased short-term memory Following Commands: Follows multi-step commands inconsistently Safety/Judgement: Decreased awareness of safety, Decreased awareness of deficits Awareness: Emergent Problem Solving: Difficulty sequencing, Requires verbal cues General Comments: PT has seen pt at least 3 times and does not remember, continues to be confused about why he has not gone home despite education that he has not been medically ready   Physical Exam: Blood pressure (!) 157/83, pulse 84, temperature 97.9 F (36.6 C), temperature source Oral, resp. rate 20, height 6' (1.829 m), weight 101.3 kg, SpO2 95 %. Physical Exam Vitals and nursing note reviewed.  Constitutional:      Comments: Older male sitting up in bed watching TV, appropriate, NAD  HENT:     Head: Normocephalic and atraumatic.     Comments: Smile equal    Right Ear: External ear normal.     Left Ear: External ear normal.     Nose: Congestion present.     Mouth/Throat:     Mouth: Mucous membranes are moist.     Pharynx: No oropharyngeal exudate.  Eyes:     General:        Right eye: No discharge.        Left eye: No discharge.     Extraocular Movements: Extraocular movements intact.  Cardiovascular:     Rate and Rhythm: Normal rate. Rhythm irregular.     Heart sounds: Normal heart sounds. No murmur heard.   Pulmonary:     Comments: A little wheezy audibly and with listening- but good air movement B/L Abdominal:     Comments: Soft, NT, ND, (+)BS    Genitourinary:    Comments: Rash on scrotum and inner  thighs- yeasty- has nystatin powder on  Musculoskeletal:     Cervical back: Normal range of motion and neck supple.     Comments: UEs 5/5 in B/L UEs deltoid, biceps, triceps, WE<  grip and finger abd LEs- 5-/5 in HF, KE, KF, DF and PF B/L   Skin:    Comments: Rash between legs- yeasty- red- and on scrotum IV R hand- looks ok No skin breakdown on backside or heels  Neurological:     Mental Status: He is oriented to person, place, and time.     Comments: Patient is alert in no acute distress.  Oriented x3 and follows commands.  Intact to light touch in all 4 extremities   Psychiatric:        Mood and Affect: Mood normal.        Behavior: Behavior normal.        Lab Results Last 48 Hours        Results for orders placed or performed during the hospital encounter of 02/22/20 (from the past 48 hour(s))  Basic metabolic panel     Status: Abnormal    Collection Time: 03/05/20 12:33 AM  Result Value Ref Range    Sodium 133 (L) 135 - 145 mmol/L    Potassium 3.4 (L) 3.5 - 5.1 mmol/L    Chloride 100 98 - 111 mmol/L    CO2 22 22 - 32 mmol/L    Glucose, Bld 94 70 - 99 mg/dL      Comment: Glucose reference range applies only to samples taken after fasting for at least 8 hours.    BUN 10 8 - 23 mg/dL    Creatinine, Ser 1.09 0.61 - 1.24 mg/dL    Calcium 8.6 (L) 8.9 - 10.3 mg/dL    GFR, Estimated >60 >60 mL/min      Comment: (NOTE) Calculated using the CKD-EPI Creatinine Equation (2021)      Anion gap 11 5 - 15      Comment: Performed at [facility] [address], [place], [place] [address]  Magnesium     Status: Abnormal    Collection Time: 03/05/20 12:33 AM  Result Value Ref Range    Magnesium 1.3 (L) 1.7 - 2.4 mg/dL      Comment: Performed at [facility] [address] [place], [place] [address]      Imaging Results (Last 48 hours)  No results found.           Medical Problem List and Plan: 1.   Debility secondary to atrial fibrillation with  RVR/multimedical             -patient may  shower             -ELOS/Goals: 7-10 days mod I 2.  Antithrombotics: -DVT/anticoagulation: Eliquis             -antiplatelet therapy: N/A 3. Pain Management: Tylenol as needed 4. Mood: Lexapro 10 mg daily.  Provide emotional support             -antipsychotic agents: N/A 5. Neuropsych: This patient is capable of making decisions on his own behalf. 6. Skin/Wound Care: Routine skin checks 7. Fluids/Electrolytes/Nutrition: Routine in and outs with follow-up chemistries 8.  PAF.  Amiodarone 400 mg twice daily x1 week then decrease to 200 mg daily as per cardiology services.  Continue Eliquis 9.  Hypertension.  Monitor with increased mobility 10.  Chronic diastolic dysfunction.  Monitor for any signs of fluid overload 11.  Hyperlipidemia.  Lipitor  12.  GERD.  Protonix 13.  History of alcohol use.  Counseling 14.  Medical noncompliance.  Provide counseling 15.  Acute on chronic anemia.  Continue iron  supplement.  Follow-up CBC 16. Wheezing/allergies- needs some Claritin 17. Rash between legs- needs nystatin powder/cream       Lavon Paganini Angiulli, PA-C 03/05/2020        I have personally performed a face to face diagnostic evaluation of this patient and formulated the key components of the plan.  Additionally, I have personally reviewed laboratory data, imaging studies, as well as relevant notes and concur with the physician assistant's documentation above.   The patient's status has not changed from the original H&P.  Any changes in documentation from the acute care chart have been noted above.

## 2020-03-07 NOTE — Progress Notes (Signed)
[facility] PHYSICAL MEDICINE & REHABILITATION PROGRESS NOTE  Subjective/Complaints: Pt did well with therapy yesterday. A little sore! No complaints today  ROS: Patient denies fever, rash, sore throat, blurred vision, nausea, vomiting, diarrhea, cough, shortness of breath or chest pain, joint or back pain, headache, or mood change.   Objective: Vital Signs: Blood pressure 122/61, pulse 72, temperature 98.7 F (37.1 C), resp. rate 15, height 6' (1.829 m), weight 102.3 kg, SpO2 99 %. No results found. Recent Labs    03/06/20 0422  WBC 8.5  HGB 11.3*  HCT 34.3*  PLT 205   Recent Labs    03/05/20 0033 03/06/20 0422  NA 133* 137  K 3.4* 3.5  CL 100 106  CO2 22 21*  GLUCOSE 94 97  BUN 10 7*  CREATININE 1.09 0.93  CALCIUM 8.6* 8.7*    Intake/Output Summary (Last 24 hours) at 03/07/2020 0912 Last data filed at 03/07/2020 0752 Gross per 24 hour  Intake 936 ml  Output 1200 ml  Net -264 ml        Physical Exam: BP 122/61   Pulse 72   Temp 98.7 F (37.1 C)   Resp 15   Ht 6' (1.829 m)   Wt 102.3 kg   SpO2 99%   BMI 30.59 kg/m  Constitutional: No distress . Vital signs reviewed. HEENT: EOMI, oral membranes moist Neck: supple Cardiovascular: RRR without murmur. No JVD    Respiratory/Chest: CTA Bilaterally without wheezes or rales. Normal effort    GI/Abdomen: BS +, non-tender, non-distended Ext: no clubbing, cyanosis, or edema Psych: pleasant and cooperative Musc: No edema in extremities.  No tenderness in extremities. Neuro: Alert Bilateral upper extremities: 5/5 proximal distal Left lower extremity: 4+-5/5 proximal distal Right lower extremity: 4-/5 hip flexion, knee extension, 4+/5 ankle dorsiflexion  Assessment/Plan: 1. Functional deficits secondary to debility which require 3+ hours per day of interdisciplinary therapy in a comprehensive inpatient rehab setting.  Physiatrist is providing close team supervision and 24 hour management of active medical  problems listed below.  Physiatrist and rehab team continue to assess barriers to discharge/monitor patient progress toward functional and medical goals   Care Tool:  Bathing    Body parts bathed by patient: Right arm, Left arm, Chest, Abdomen, Front perineal area, Buttocks, Right upper leg, Left upper leg, Face, Left lower leg, Right lower leg         Bathing assist Assist Level: Contact Guard/Touching assist     Upper Body Dressing/Undressing Upper body dressing   What is the patient wearing?: Pull over shirt    Upper body assist Assist Level: Set up assist    Lower Body Dressing/Undressing Lower body dressing      What is the patient wearing?: Incontinence brief     Lower body assist Assist for lower body dressing: Supervision/Verbal cueing     Toileting Toileting    Toileting assist Assist for toileting: Independent with assistive device Assistive Device Comment: urinal   Transfers Chair/bed transfer  Transfers assist     Chair/bed transfer assist level: Minimal Assistance - Patient > 75%     Locomotion Ambulation   Ambulation assist      Assist level: Minimal Assistance - Patient > 75% Assistive device: Walker-rolling Max distance: 182ft   Walk 10 feet activity   Assist     Assist level: Minimal Assistance - Patient > 75% Assistive device: Walker-rolling   Walk 50 feet activity   Assist    Assist level: Minimal Assistance - Patient >  75% Assistive device: Walker-rolling    Walk 150 feet activity   Assist Walk 150 feet activity did not occur: Safety/medical concerns (fatigue)         Walk 10 feet on uneven surface  activity   Assist Walk 10 feet on uneven surfaces activity did not occur: Safety/medical concerns         Wheelchair     Assist Will patient use wheelchair at discharge?: No             Wheelchair 50 feet with 2 turns activity    Assist            Wheelchair 150 feet activity      Assist           Medical Problem List and Plan: 1.Debilitysecondary toatrial fibrillation with RVR/multimedical  -Continue CIR therapies including PT, OT  2. Antithrombotics: -DVT/anticoagulation:Eliquis -antiplatelet therapy: N/A 3. Pain Management:Tylenol as needed 4. Mood:Lexapro 10 mg daily. Provide emotional support -antipsychotic agents: N/A 5. Neuropsych: This patientiscapable of making decisions on hisown behalf. 6. Skin/Wound Care:Routine skin checks 7. Fluids/Electrolytes/Nutrition:   -10/30 pt eating 100% of meals. 8. PAF. Amiodarone400 mg twice daily x1 week then decrease to 200 mg dailyas per cardiology services. Continue Eliquis  Monitor with increased exertion  Repeat ECG ordered 9.  History of hypertension.   10/30 well controlled 10. Chronic diastolic dysfunction. Monitor for any signs of fluid overload Filed Weights   03/06/20 0031 03/06/20 0500 03/07/20 0500  Weight: 102.3 kg 101.4 kg 102.3 kg   Weights controlled 11. Hyperlipidemia. Lipitor  12. GERD. Protonix 13. History of alcohol use. Counseling 14. Medical noncompliance. Provide counseling 15. Chronic anemia. Continue iron supplement.   Hemoglobin 11.3 on 10/29  Continue to monitor 16.  Allergies:    Claritin 17. Rash between legs- needs nystatin powder/cream 15.  Hypoalbuminemia  Supplement initiated on 10/29  -pt eating 100% of meals  LOS: 1 days A FACE TO FACE EVALUATION WAS PERFORMED  Meredith Staggers 03/07/2020, 9:12 AM

## 2020-03-07 NOTE — Progress Notes (Signed)
Inpatient Rehabilitation Medication Review by a Pharmacist   A complete drug regimen review was completed for this patient to identify any potential clinically significant medication issues.   Clinically significant medication issues were identified:  yes     Type of Medication Issue Identified Description of Issue Urgent (address now) Non-Urgent (address on AM team rounds) Plan Plan Accepted by Provider? (Yes / No / Pending AM Rounds)  Drug Interaction(s) (clinically significant)            Duplicate Therapy            Allergy            No Medication Administration End Date            Incorrect Dose   Cardiology note states to continue amiodarone 400 mg BID x 1 more week, and start amiodarone 200 mg daily on November 5th, was entered to start on Nov. 3rd Urgent Adjust dates Yes  Additional Drug Therapy Needed   Trazodone, tamsulosin, MVI, Lasix prn Non-urgent Restart as appropriate Trazodone and tamsulosin restarted  Other               Pharmacist comments: Trazodone and tamsulosin restarted. Please restart multivitamin and prn Lasix as appropriate.    Total time spent performing this drug regimen review (minutes):  60   Thank you for allowing [place] to participate in this patients care.  Rebbeca Paul, PharmD PGY1 Pharmacy Resident 03/07/2020 11:02 AM  Please check [email or web address] for unit-specific pharmacy phone numbers.

## 2020-03-08 ENCOUNTER — Inpatient Hospital Stay (HOSPITAL_COMMUNITY): Payer: Medicare Other

## 2020-03-08 NOTE — Progress Notes (Signed)
Physical Therapy Session Note  Patient Details  Name: Darryl Diaz MRN: [id number] Date of Birth: 1940-05-23  Today's Date: 03/08/2020 PT Individual Time: 0945-1100 ; 1550-1640 PT Individual Time Calculation (min): 75 min , 50 min  Short Term Goals: Week 1:  PT Short Term Goal 1 (Week 1): STG = LTG due to ELOS  Skilled Therapeutic Interventions/Progress Updates:  Pt in bed, with 1 LE hanging off of bed, impulsivley attempting to sit up as PT entered room.  Pt complied when PT asked him to lie back down. Pt denied pain.  Supine> sit EOB with extra time, max cues for technique, as pt was attempting long sitting.  Stand pivot to wc on r with min assist.  neuromuscular re-education via forced use, multimodal cues for sustained stretch bil heel cords and hamstrings, standing with forefeet on wedge, x 3 minutes, x 2..    While standing on wedge, pt used R hand to manipulate colored discs on table to R side, without LOB.  Ehen using L hand to manipulate discs, on table to L side he had immediate LOB and had to sit suddenly. After standing for 30 seconds with forefeet on wedge, he was able to use L hand for tasks stacking and un-stacking disks,  without LOB.   Gait training iwht RW on level tile, including turns, with CGA, x 100'.  Pt wearing slippers without non slip soles; he stated that he has sneakers at home, but no one to bring them.  Pt lacks push off bil LEs.  O2 sats 94%, HR 90 after ambulating. Advanced gait training through obstacle course requiring side stepping with RW, CGA without cues.  Gait x 150' to return to room, RW on level tile with CGA.   Sensory check of bil feet: pt unaware, but light touch lacking bil great toes.  Therapeutic exercises performed with LEs to increase strength for functional mobility- 10 x 2 seated: bil heel raises;10 x 1 standing with bil UE support:  mini squats;  8 x 1 heel/toe raises.  At end of session, pt seated in recliner with bil feet elevated  and back reclined, with needs at hand and seat pad alarm set.   tx 2:  Pt lying in bed and impulsivley sitting up as PT entered room.  He denied pain. Supine> sit using railing, with supervision.  Sit> stand to RW with close supervision.  Gait with RW to/from PT gym, 150' x 2 CGA and cues to slow down for safety.  Sit>< supine on mat with supervision.    Therapeutic exercises performed with LEs and trunk to increase strength for functional mobility:  L/R side lying for 10 x 1 R/L hip abduction with flexed hips and knees, bil hip flexion.  Pt unable to activate R glutues medius for hip abduction.  In supine, 10 x 1 each: bil adductor squeezes, bil pelvic tilts,  R/L straight leg raises., bil bridging.   Pt's O2 sats dropped to 85% briefly, and HR to 133 briefly during exs; recovered in less than 20 seconds with cues to breathe slowly and deeply.   At end of session, pt resting in bed with alarm set and needs at hand.     Therapy Documentation Precautions:  Precautions Precautions: Fall Precaution Comments: history of falls, impulsive, mild tremors BUE with intentional movements Restrictions Weight Bearing Restrictions: No       Therapy/Group: Individual Therapy  Takeria Marquina 03/08/2020, 12:21 PM

## 2020-03-08 NOTE — Progress Notes (Signed)
[place] PHYSICAL MEDICINE & REHABILITATION PROGRESS NOTE  Subjective/Complaints: Overall feeling well. No new complaints this morning. Still a little sore from therapy.   ROS: Patient denies fever, rash, sore throat, blurred vision, nausea, vomiting, diarrhea, cough, shortness of breath or chest pain, joint or back pain, headache, or mood change.    Objective: Vital Signs: Blood pressure (!) 153/86, pulse 72, temperature 98.3 F (36.8 C), resp. rate 15, height 6' (1.829 m), weight 104.1 kg, SpO2 96 %. No results found. Recent Labs    03/06/20 0422  WBC 8.5  HGB 11.3*  HCT 34.3*  PLT 205   Recent Labs    03/06/20 0422  NA 137  K 3.5  CL 106  CO2 21*  GLUCOSE 97  BUN 7*  CREATININE 0.93  CALCIUM 8.7*    Intake/Output Summary (Last 24 hours) at 03/08/2020 0826 Last data filed at 03/08/2020 0432 Gross per 24 hour  Intake 456 ml  Output 1250 ml  Net -794 ml        Physical Exam: BP (!) 153/86 (BP Location: Right Arm)   Pulse 72   Temp 98.3 F (36.8 C)   Resp 15   Ht 6' (1.829 m)   Wt 104.1 kg   SpO2 96%   BMI 31.13 kg/m  Constitutional: No distress . Vital signs reviewed. HEENT: EOMI, oral membranes moist Neck: supple Cardiovascular: IRR without murmur. No JVD    Respiratory/Chest: CTA Bilaterally without wheezes or rales. Normal effort    GI/Abdomen: BS +, non-tender, non-distended Ext: no clubbing, cyanosis, or edema Psych: pleasant and cooperative Musc: No edema in extremities.  No tenderness in extremities. Neuro: Alert Bilateral upper extremities: 5/5 proximal distal Left lower extremity: 4+-5/5 proximal distal Right lower extremity: 4-/5 hip flexion, knee extension, 4+/5 ankle dorsiflexion  Assessment/Plan: 1. Functional deficits secondary to debility which require 3+ hours per day of interdisciplinary therapy in a comprehensive inpatient rehab setting.  Physiatrist is providing close team supervision and 24 hour management of active  medical problems listed below.  Physiatrist and rehab team continue to assess barriers to discharge/monitor patient progress toward functional and medical goals   Care Tool:  Bathing    Body parts bathed by patient: Right arm, Left arm, Chest, Abdomen, Front perineal area, Buttocks, Right upper leg, Left upper leg, Face, Left lower leg, Right lower leg         Bathing assist Assist Level: Contact Guard/Touching assist     Upper Body Dressing/Undressing Upper body dressing   What is the patient wearing?: Pull over shirt    Upper body assist Assist Level: Set up assist    Lower Body Dressing/Undressing Lower body dressing      What is the patient wearing?: Incontinence brief     Lower body assist Assist for lower body dressing: Supervision/Verbal cueing     Toileting Toileting    Toileting assist Assist for toileting: Contact Guard/Touching assist Assistive Device Comment: urinal   Transfers Chair/bed transfer  Transfers assist  Chair/bed transfer activity did not occur: N/A  Chair/bed transfer assist level: Minimal Assistance - Patient > 75%     Locomotion Ambulation   Ambulation assist   Ambulation activity did not occur: N/A  Assist level: Minimal Assistance - Patient > 75% Assistive device: Walker-rolling Max distance: 131ft   Walk 10 feet activity   Assist  Walk 10 feet activity did not occur: N/A  Assist level: Minimal Assistance - Patient > 75% Assistive device: Walker-rolling   Walk 50  feet activity   Assist Walk 50 feet with 2 turns activity did not occur: N/A  Assist level: Minimal Assistance - Patient > 75% Assistive device: Walker-rolling    Walk 150 feet activity   Assist Walk 150 feet activity did not occur: N/A         Walk 10 feet on uneven surface  activity   Assist Walk 10 feet on uneven surfaces activity did not occur: N/A         Wheelchair     Assist Will patient use wheelchair at discharge?: No              Wheelchair 50 feet with 2 turns activity    Assist            Wheelchair 150 feet activity     Assist           Medical Problem List and Plan: 1.Debilitysecondary toatrial fibrillation with RVR/multimedical  -Continue CIR therapies including PT, OT  2. Antithrombotics: -DVT/anticoagulation:Eliquis -antiplatelet therapy: N/A 3. Pain Management:Tylenol as needed 4. Mood:Lexapro 10 mg daily. Provide emotional support -antipsychotic agents: N/A 5. Neuropsych: This patientiscapable of making decisions on hisown behalf. 6. Skin/Wound Care:Routine skin checks 7. Fluids/Electrolytes/Nutrition:   -10/30-31 pt eating 100% of meals. 8. PAF. Amiodarone400 mg twice daily x1 week then decrease to 200 mg dailyas per cardiology services. Continue Eliquis  Monitor with increased exertion  Repeat ECG ordered--HR controlled 9.  History of hypertension.   10/31 well controlled 10. Chronic diastolic dysfunction. Monitor for any signs of fluid overload Filed Weights   03/06/20 0500 03/07/20 0500 03/08/20 0432  Weight: 101.4 kg 102.3 kg 104.1 kg   Weights controlled 11. Hyperlipidemia. Lipitor  12. GERD. Protonix 13. History of alcohol use. Counseling 14. Medical noncompliance. Provide counseling 15. Chronic anemia. Continue iron supplement.   Hemoglobin 11.3 on 10/29  Continue to monitor 16.  Allergies:    Claritin 17. Rash between legs- needs nystatin powder/cream 15.  Hypoalbuminemia  Supplement initiated on 10/29  -pt eating 100% of meals  LOS: 2 days A FACE TO FACE EVALUATION WAS PERFORMED  Meredith Staggers 03/08/2020, 8:26 AM

## 2020-03-08 NOTE — Progress Notes (Signed)
Occupational Therapy Session Note  Patient Details  Name: Darryl Diaz MRN: [id number] Date of Birth: [birth year]  Today's Date: 03/08/2020 OT Individual Time: 1345-1430 OT Individual Time Calculation (min): 45 min    Short Term Goals: Week 1:  OT Short Term Goal 1 (Week 1): STGs=LTGs due to ELOS  Skilled Therapeutic Interventions/Progress Updates:    OT intervention with focus on functional amb with RW, furniture tranfsers, bed transfers, home safety recommendations, standing balance, and activity tolerance to increase independence with BADLs. Sit<>stand from sofa in ADL apartment with min A. Bed transfers on ADL bed with supervision. Pt engaged in standing tasks at [employer] ball and 1kg ball (3x15 each). Pt amb from room to ADL apartment to gym and back to room with Supervision. Pt returned to bed and remained in bed with all needs within reach. Bed alarm activated.   Therapy Documentation Precautions:  Precautions Precautions: Fall Precaution Comments: history of falls, impulsive, mild tremors BUE with intentional movements Restrictions Weight Bearing Restrictions: No Pain:  Pt denies pain  Therapy/Group: Individual Therapy  Leroy Libman 03/08/2020, 2:42 PM

## 2020-03-08 NOTE — Progress Notes (Signed)
Occupational Therapy Session Note  Patient Details  Name: Darryl Diaz MRN: [id number] Date of Birth: 1940-11-25  Today's Date: 03/08/2020 OT Individual Time: 0700-0755 OT Individual Time Calculation (min): 55 min    Short Term Goals: Week 1:  OT Short Term Goal 1 (Week 1): STGs=LTGs due to ELOS  Skilled Therapeutic Interventions/Progress Updates:    Pt resting in bed upon arrival and ready to get OOB. Pt requested to use toilet. Sit<>stand with CGA. Amb with RW to bathroom with CGA. Stand>sit on toilet with supervision. Toileting with CGA. Sit>stand from toilet with mod A (lower surface.)  Pt amb with RW to sink and stood at sink for grooming tasks.  Pt's shorts were minimally wet but pt declined to change at this time.  W/c to gym for time mgmt. 10 mins NuStep, level 4, 55 SPM. Pt required 5 min rest break following exercise. [employer] with CGA/supervision - 2.73 and 2.07 seconds. Following rest break pt amb with RW back to room and sat EOB. Pt remained EOB with all needs within reach and bed alarm activated.   Therapy Documentation Precautions:  Precautions Precautions: Fall Precaution Comments: history of falls, impulsive, mild tremors BUE with intentional movements Restrictions Weight Bearing Restrictions: No Pain: Pain Assessment Pain Scale: 0-10 Pain Score: 0-No pain   Therapy/Group: Individual Therapy  Leroy Libman 03/08/2020, 8:01 AM

## 2020-03-09 ENCOUNTER — Inpatient Hospital Stay (HOSPITAL_COMMUNITY): Payer: Medicare Other

## 2020-03-09 ENCOUNTER — Encounter (HOSPITAL_COMMUNITY): Payer: Self-pay | Admitting: Physical Medicine & Rehabilitation

## 2020-03-09 ENCOUNTER — Inpatient Hospital Stay (HOSPITAL_COMMUNITY): Payer: Medicare Other | Admitting: Occupational Therapy

## 2020-03-09 DIAGNOSIS — I480 Paroxysmal atrial fibrillation: Secondary | ICD-10-CM

## 2020-03-09 NOTE — Progress Notes (Signed)
[place] PHYSICAL MEDICINE & REHABILITATION PROGRESS NOTE  Subjective/Complaints: Patient seen sitting up this morning, working with therapies.  He states he slept well overnight he states he had a good weekend.  He states this is the best he is felt in a long time.  ROS: Denies CP, SOB, N/V/D  Objective: Vital Signs: Blood pressure (!) 122/55, pulse 75, temperature 97.8 F (36.6 C), resp. rate 18, height 6' (1.829 m), weight 102.3 kg, SpO2 97 %. No results found. No results for input(s): WBC, HGB, HCT, PLT in the last 72 hours. No results for input(s): NA, K, CL, CO2, GLUCOSE, BUN, CREATININE, CALCIUM in the last 72 hours.  Intake/Output Summary (Last 24 hours) at 03/09/2020 0839 Last data filed at 03/09/2020 0830 Gross per 24 hour  Intake 980 ml  Output 1900 ml  Net -920 ml        Physical Exam: BP (!) 122/55 (BP Location: Right Arm)   Pulse 75   Temp 97.8 F (36.6 C)   Resp 18   Ht 6' (1.829 m)   Wt 102.3 kg   SpO2 97%   BMI 30.59 kg/m  Constitutional: No distress . Vital signs reviewed. HENT: Normocephalic.  Atraumatic. Eyes: EOMI. No discharge. Cardiovascular: No JVD.  Rate controlled. Respiratory: Normal effort.  No stridor.   GI: Non-distended.   Skin: Warm and dry.  Intact. Psych: Normal mood.  Normal behavior. Musc: No edema in extremities.  No tenderness in extremities. Neuro: Alert Bilateral upper extremities: 5/5 proximal distal, appears unchanged Left lower extremity: 4+-5/5 proximal distal Right lower extremity: 4-/5 hip flexion, knee extension, 4+/5 ankle dorsiflexion  Assessment/Plan: 1. Functional deficits secondary to debility which require 3+ hours per day of interdisciplinary therapy in a comprehensive inpatient rehab setting.  Physiatrist is providing close team supervision and 24 hour management of active medical problems listed below.  Physiatrist and rehab team continue to assess barriers to discharge/monitor patient progress toward  functional and medical goals   Care Tool:  Bathing    Body parts bathed by patient: Right arm, Left arm, Chest, Abdomen, Front perineal area, Buttocks, Right upper leg, Left upper leg, Face, Left lower leg, Right lower leg         Bathing assist Assist Level: Contact Guard/Touching assist     Upper Body Dressing/Undressing Upper body dressing   What is the patient wearing?: Pull over shirt    Upper body assist Assist Level: Set up assist    Lower Body Dressing/Undressing Lower body dressing      What is the patient wearing?: Incontinence brief, Pants     Lower body assist Assist for lower body dressing: Supervision/Verbal cueing     Toileting Toileting    Toileting assist Assist for toileting: Contact Guard/Touching assist Assistive Device Comment: urinal   Transfers Chair/bed transfer  Transfers assist  Chair/bed transfer activity did not occur: N/A  Chair/bed transfer assist level: Minimal Assistance - Patient > 75%     Locomotion Ambulation   Ambulation assist   Ambulation activity did not occur: N/A  Assist level: Minimal Assistance - Patient > 75% Assistive device: Walker-rolling Max distance: 175ft   Walk 10 feet activity   Assist  Walk 10 feet activity did not occur: N/A  Assist level: Minimal Assistance - Patient > 75% Assistive device: Walker-rolling   Walk 50 feet activity   Assist Walk 50 feet with 2 turns activity did not occur: N/A  Assist level: Minimal Assistance - Patient > 75% Assistive device: Walker-rolling  Walk 150 feet activity   Assist Walk 150 feet activity did not occur: N/A         Walk 10 feet on uneven surface  activity   Assist Walk 10 feet on uneven surfaces activity did not occur: N/A         Wheelchair     Assist Will patient use wheelchair at discharge?: No             Wheelchair 50 feet with 2 turns activity    Assist            Wheelchair 150 feet activity      Assist           Medical Problem List and Plan: 1.Debilitysecondary toatrial fibrillation with RVR/multimedical  Continue CIR 2. Antithrombotics: -DVT/anticoagulation:Eliquis -antiplatelet therapy: N/A 3. Pain Management:Tylenol as needed 4. Mood:Lexapro 10 mg daily. Provide emotional support -antipsychotic agents: N/A 5. Neuropsych: This patientiscapable of making decisions on hisown behalf. 6. Skin/Wound Care:Routine skin checks 7. Fluids/Electrolytes/Nutrition:  8. PAF. Amiodarone400 mg twice daily x1 week then decrease to 200 mg dailyas per cardiology services. Continue Eliquis  Monitor with increased exertion  Repeat ECG showing A. fib with RVR on 10/29, however appears to be better rate controlled on 11/1 9.  History of hypertension.   10/31 well controlled 10. Chronic diastolic dysfunction. Monitor for any signs of fluid overload Filed Weights   03/07/20 0500 03/08/20 0432 03/09/20 0600  Weight: 102.3 kg 104.1 kg 102.3 kg   Stable on 11/1 11. Hyperlipidemia. Lipitor  12. GERD. Protonix 13. History of alcohol use. Counseling 14. Medical noncompliance. Provide counseling 15. Chronic anemia. Continue iron supplement.   Hemoglobin 11.3 on 10/29  Continue to monitor 16.  Allergies:    Claritin 17. Rash between legs- needs nystatin powder/cream 15.  Hypoalbuminemia  Supplement initiated on 10/29  LOS: 3 days A FACE TO FACE EVALUATION WAS PERFORMED  Christinamarie Tall Lorie Phenix 03/09/2020, 8:39 AM

## 2020-03-09 NOTE — Progress Notes (Signed)
Occupational Therapy Session Note  Patient Details  Name: Darryl Diaz MRN: [id number] Date of Birth: 1940-09-21  Today's Date: 03/09/2020 OT Individual Time: 0800-0900 OT Individual Time Calculation (min): 60 min    Short Term Goals: Week 1:  OT Short Term Goal 1 (Week 1): STGs=LTGs due to ELOS  Skilled Therapeutic Interventions/Progress Updates:    Patient in bed, alert and ready for therapy.  He denies pain and states that he is eager to participate with activities this morning.  Bed wet with urine - nursing tech changed linens.  Supine to sit CS, ambulation with RW to/from bed, toilet, shower bench, arm chair and w/c with CG/CS.  toileting completed with CS, min cues.  Bathing completed CS sit and stand in shower with hand held shower, grab bars and shower bench.  Dressing completed seated in arm chair with set up/CS.  Oral care and grooming tasks in stance at sink with CS.   He ambulated to/from therapy gym with RW CS.  Completed UB ergometer 5 minutes + 4 minutes seated chair without arms.  He returned to w/c at close of session, seat alarm set and call bell/tray table in reach.    Therapy Documentation Precautions:  Precautions Precautions: Fall Precaution Comments: history of falls, impulsive, mild tremors BUE with intentional movements Restrictions Weight Bearing Restrictions: No   Therapy/Group: Individual Therapy  Carlos Levering 03/09/2020, 7:33 AM

## 2020-03-09 NOTE — Progress Notes (Signed)
Patient ID: Darryl Diaz, male   DOB: 01-29-1941, 79 y.o.   MRN: [id number] Pt would like to go home wed he feels his sister and brother in-law will transport home and then stay with him. Until his stepdaughter and her uncle fly in wed evening. His son will fly in Sat and be there until Thanksgiving. PT to talk with OT and confirm both feel ready for him to discharge Wed. Confirm in am with team and MD.

## 2020-03-09 NOTE — Discharge Summary (Addendum)
Physician Discharge Summary  Patient ID: Darryl Diaz MRN: [id number] DOB/AGE: 78-May-1942 78 y.o.  Admit date: 03/06/2020 Discharge date: 03/11/2020  Discharge Diagnoses:  Principal Problem:   Debility Active Problems:   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 (HCC)   Allergies   Anemia, chronic disease   Chronic diastolic congestive heart failure (HCC)   History of hypertension   Atrial fibrillation with RVR (HCC) GERD Hyperlipidemia History of alcohol use Question medical compliance History of GI bleed  Discharged Condition: Stable  Significant Diagnostic Studies: NM Myocar Multi W/Spect W/Wall Motion / EF  Result Date: 02/23/2020  There was no ST segment deviation noted during stress.  The study is normal.  This is a low risk study.  The left ventricular ejection fraction is normal (55-65%).    DG Chest Portable 1 View  Result Date: 02/22/2020 CLINICAL DATA:  Syncope EXAM: PORTABLE CHEST 1 VIEW COMPARISON:  February 05, 2020 FINDINGS: Lungs are clear. Heart is borderline enlarged with pulmonary vascularity normal. No adenopathy. No bone lesions. IMPRESSION: Borderline cardiac enlargement.  Lungs clear. Electronically Signed   By: Lowella Grip III M.D.   On: 02/22/2020 13:51   ECHOCARDIOGRAM LIMITED  Result Date: 02/25/2020    ECHOCARDIOGRAM LIMITED REPORT   Patient Name:   Darryl Diaz Date of Exam: 02/23/2020 Medical Rec #:  [id number]        Height:       72.0 in Accession #:    [id number]       Weight:       235.0 lb Date of Birth:  1941/04/01        BSA:          2.282 m Patient Age:    79 years         BP:           132/85 mmHg Patient Gender: M                HR:           117 bpm. Exam Location:  Inpatient Procedure: Limited Echo, Cardiac Doppler, Color Doppler and Intracardiac            Opacification Agent Indications:    Syncope  History:        Patient has prior history of Echocardiogram examinations, most                 recent  02/06/2020. Arrythmias:Atrial Fibrillation,                 Signs/Symptoms:Syncope; Risk Factors:Hypertension, Dyslipidemia                 and Former Smoker. GERD.  Sonographer:    Clayton Lefort RDCS (AE) Referring Phys: [id number] [place]  1. Left ventricular ejection fraction, by estimation, is 60 to 65%. The left ventricle has normal function. The left ventricle has no regional wall motion abnormalities. Left ventricular diastolic function could not be evaluated.  2. Apical RV hyperkinesis. Right ventricular systolic function is normal. The right ventricular size is mildly enlarged.  3. Left atrial size was mildly dilated.  4. The mitral valve is grossly normal. Trivial mitral valve regurgitation.  5. The aortic valve was not well visualized. Aortic valve regurgitation is not visualized.  6. There is dilatation of the aortic root, measuring 42 mm. There is dilatation of the ascending aorta, measuring 39 mm.  7. The inferior vena cava is dilated in size with <50% respiratory variability, suggesting  right atrial pressure of 15 mmHg. Comparison(s): A prior study was performed on 02/06/20. Compared to prior, McConnell's Sign redemonstrated. Stable Aortic Root and Ascending Aorta measurements. IVC dilation increased from prior. Less MR demonstrated in this study. FINDINGS  Left Ventricle: Left ventricular ejection fraction, by estimation, is 60 to 65%. The left ventricle has normal function. The left ventricle has no regional wall motion abnormalities. Definity contrast agent was given IV to delineate the left ventricular  endocardial borders. Left ventricular diastolic function could not be evaluated. Right Ventricle: Apical RV hyperkinesis. The right ventricular size is mildly enlarged. Right ventricular systolic function is normal. Left Atrium: Left atrial size was mildly dilated. Right Atrium: Right atrial size was normal in size. Pericardium: There is no evidence of pericardial effusion. Mitral  Valve: The mitral valve is grossly normal. Trivial mitral valve regurgitation. Tricuspid Valve: The tricuspid valve is not well visualized. Tricuspid valve regurgitation is not demonstrated. Aortic Valve: The aortic valve was not well visualized. Aortic valve regurgitation is not visualized. Pulmonic Valve: The pulmonic valve was not well visualized. Pulmonic valve regurgitation is not visualized. Aorta: Index to height squared only 2.16. There is dilatation of the aortic root, measuring 42 mm. There is dilatation of the ascending aorta, measuring 39 mm. Venous: The inferior vena cava is dilated in size with less than 50% respiratory variability, suggesting right atrial pressure of 15 mmHg. IAS/Shunts: The atrial septum is grossly normal. LEFT VENTRICLE PLAX 2D LVOT diam:     2.20 cm LVOT Area:     3.80 cm  IVC IVC diam: 2.80 cm  AORTA Ao Root diam: 4.20 cm Ao Asc diam:  3.90 cm  SHUNTS Systemic Diam: 2.20 cm Rudean Haskell MD Electronically signed by Rudean Haskell MD Signature Date/Time: 02/25/2020/11:47:53 AM    Final     Labs:  Basic Metabolic Panel: Recent Labs  Lab 03/05/20 0033 03/05/20 1416 03/06/20 0422  NA 133*  --  137  K 3.4*  --  3.5  CL 100  --  106  CO2 22  --  21*  GLUCOSE 94  --  97  BUN 10  --  7*  CREATININE 1.09  --  0.93  CALCIUM 8.6*  --  8.7*  MG 1.3* 1.5*  --     CBC: Recent Labs  Lab 03/06/20 0422  WBC 8.5  NEUTROABS 5.7  HGB 11.3*  HCT 34.3*  MCV 95.3  PLT 205    CBG: No results for input(s): GLUCAP in the last 168 hours.  Family history.  Hypertension and hyperlipidemia.  Denies any diabetes mellitus colon cancer esophageal cancer  Brief HPI:   Darryl Diaz is a 79 y.o. 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  Patient recent  admission 02/05/2020 to 02/07/2020 for A. fib with RVR during which she received cardioversion hospital course complicated by electrolyte imbalance and pressure wounds followed by wound care nurse and was discharged with skilled nursing facility before returning home.  Per chart review lives alone independent with assistive device.  Two-level home bed and bath main level.  Patient does drive and does his own grocery shopping.  He does have family checks on him routinely.  Patient did see his cardiologist 02/18/2020 follow-up again atrial fibrillation recommendations to begin flecainide and plan for DCCV 02/24/2020 but presented 02/22/2020 with bouts of syncope.  On  arrival to the ED elevated heart rate 110s to 130 potassium 3.5 magnesium 1.9 creatinine 1.1 hemoglobin 11.2 platelets 295,000 BNP 266 EKG atrial fibrillation with rate 145.  Cardiology service follow-up Toprol discontinued echocardiogram ejection fraction 6065% no wall motion abnormalities.  Patient again with episodes in and out of atrial fibrillation placed on amiodarone adjusted as well as remaining on Eliquis.  He was tolerating regular diet.  Therapy evaluations completed and patient was admitted for a comprehensive rehab program.   Hospital Course: Darryl Diaz was admitted to rehab 03/06/2020 for inpatient therapies to consist of PT, ST and OT at least three hours five days a week. Past admission physiatrist, therapy team and rehab RN have worked together to provide customized collaborative inpatient rehab.  Pertaining to patient's debility related to atrial fibrillation RVR multimedical.  Remained stable maintained on Eliquis.  Amiodarone adjusted per cardiology services.  No chest pain or shortness of breath.  He exhibited no signs of fluid overload.  He continued on Lipitor for hyperlipidemia.  Protonix for GERD.  There was a noted history of alcohol use receiving counsel regards to cessation of any alcohol products.  Mood stabilization  with Lexapro as well as trazodone for sleep.  Patient was attending full therapies.  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  Also had a long discussion in regards to medical compliance and maintaining medical regimen.   Blood pressures were monitored on TID basis and controlled     Rehab course: During patient's stay in rehab weekly team conferences were held to monitor patient's progress, set goals and discuss barriers to discharge. At admission, patient required min mod assist 50 feet rolling walker minimal guard sit to stand min assist sit to supine.  Minimal assist upper body bathing minimal assist upper body dressing set up lower body dressing minimal assist toilet transfers  Physical exam.  Blood pressure 157/83 pulse 84 temperature 97.9 respirations 18 oxygen saturation 95% room air Constitutional.  No acute distress HEENT Head.  Normocephalic and atraumatic Eyes.  Pupils round and reactive to light no discharge.nystagmus Neck.  Supple nontender no JVD without thyromegaly Cardiac rhythm irregular irregular no murmur heard Abdomen.  Soft nontender positive bowel sounds without rebound Respiratory effort normal no respiratory distress without wheeze Musculoskeletal.  Normal range of motion Comments.  Upper extremities 5/5 bilateral upper extremities deltoids biceps triceps wrist extension grip and finger abduction Lower extremities 5 -/5 knee extension knee flexion dorsi plantar flexion Neurological alert and oriented x3  He/  has had improvement in activity tolerance, balance, postural control as well as ability to compensate for deficits. He/ has had improvement in functional use RUE/LUE  and RLE/LLE as well as improvement in awareness.  Working with energy conservation techniques.  Patient ambulating 150 feet contact-guard assist.  Oxygen saturation maintaining 94% with activity.  Supine to sit supervision.  Bathing completed contact-guard supine to sit with ADLs  ambulates to the bathroom with assistive device gather his belongings for activities day living.  Full family teaching completed plan discharge to home       Disposition: Discharged to home    Diet: Regular  Special Instructions: No driving smoking or alcohol  Medications at discharge 1.  Tylenol as needed 2 amiodarone 400 mg twice daily until 03/13/2020 to begin 200 mg daily 3.  Eliquis 5 mg p.o. twice daily 4.  Lipitor 20 mg p.o. daily 5.  Lexapro 10 mg p.o. daily 6.  Ferrous sulfate 3 and 25 mg p.o.  twice daily with meals 7.  Folic acid 1 mg p.o. daily 8.  Claritin 10 mg p.o. daily 9.  Nitroglycerin as needed chest pain 10.  Protonix 40 mg p.o. before breakfast 11.  Flomax 0.4 mg p.o. daily 12.  Trazodone 50 mg p.o. nightly 13.Lasix 40 mg daily as needed weight gain  30-35 minutes were spent completing discharge summary and discharge planning  Discharge Instructions     Ambulatory referral to Physical Medicine Rehab   Complete by: As directed    Follow-up 1 month debility/multimedical        Follow-up Information     Jamse Arn, MD Follow up.   Specialty: Physical Medicine and Rehabilitation Why: Office to call for appointment Contact information: [address] [employer] [place] [address] [phone]         Sherren Mocha, MD Follow up.   Specialty: Cardiology Why: Call for appointment Contact information: [address] [address] [phone]         Tisovec, [name], MD Follow up.   Specialty: Internal Medicine Contact information: [place] [address] [phone]         Deboraha Sprang, MD .   Specialty: Cardiology Contact information: [phone] [address] [place] [address] [phone]                 Signed: Cathlyn Parsons 03/11/2020, 5:32 AM Patient was seen, face-face, and physical exam performed by me on day of discharge, greater  than 30 minutes of total time spent.. Please see progress note from day of discharge as well.  Delice Lesch, MD, ABPMR

## 2020-03-09 NOTE — Progress Notes (Signed)
Physical Therapy Session Note  Patient Details  Name: Darryl Diaz MRN: [id number] Date of Birth: 1940-07-30  Today's Date: 03/09/2020 PT Individual Time: 1000-1100 + 1300 - 1415 PT Individual Time Calculation (min): 60 min  + 75 min  Short Term Goals: Week 1:  PT Short Term Goal 1 (Week 1): STG = LTG due to ELOS  Skilled Therapeutic Interventions/Progress Updates:     1st session: Pt received sitting in w/c, awake and agreeable to PT session without c/o pain. Donned hospital socks, supervision with L foot, required maxA for R foot (recent R hip replacement). WC transport for energy conservation from his room to main hallway to perform 6MWT. Pt performed 6MWT with RW and therapist providing CGA on level surfaces; he ambulated 478ft, requiring seated rest at 51min30sec mark. Vitals before: 99% on RA, 57HR. Vitals after completion: 92% on RA, 80HR. Reporting 7/10 RPE after completion. Vitals recover quickly with seated rest break and cues for pursed lip breathing. WC transport for energy conservation to ortho gym. Performed 2x20 standing toe taps on cones with RW support and therapist providing close supervision; cues for BLE coordination/control and paced activity. He then performed car transfer with supervision and RW, shows good understanding of general technique and sequencing. He also ambulated up/down ~83ft ramp with close supervision and RW, increased difficulty with descent > ascent for ecentric control. Reviewed DC planning with patient, general home safety training, RPE for energy conservation, and role of f/u therapy services. Pt verbalized understanding of all, reports readiness for safe DC home where he will have 24/7 S/A from multiple family members. He then ambulated from ortho gym to ADL apartment room, ~128ft, with close supervision and RW. Performed bed mobility with supervision on regular flat bed and furniture transfers from sofa couch with close supervision and RW, then ambulated  back to his room ~182ft, with supervision and RW. Stand>sit to recliner with RW and supervision; he remained sitting in recliner at end of session with needs in reach, chair alarm on, made comfortable. 6MWT age norm for 73-79 y/o males = 1,729 ft Pt ambulated 415ft.  2nd session:  Pt received sitting in recliner, awake and agreeable to PT session without c/o pain. Reports eating 100% of meal. Pt propelled himself with BLE"s only in w/c on level surfaces, approximately 140ft, for purposes of strengthening activity tolerance and endurance. After extended rest break, he negotiated up/down x12 steps (6inch height) with CGA and B HR support, reciprocal pattern with ascent and step-to pattern with descent. SpO2 desaturated to 88% with this, recovery quickly back to 99% within 1-2 minutes of seated rest and guided pursed lip breathing. He repeated the stairs twice (2x12) with seated rest breaks b/w efforts. Guided breathing throughout. Wheeled inside // bars to perform the following standing there-ex with BUE support to bars: -2x10 RLE hip abduction -2x10 LLE hip abduction -2x10 RLE hip flexion -2x10 LLE hip flexion -2x10 mini squats *therapist providing CGA throughout for safety and cues for correct muscle activation and general sequencing.  Pt then completed NMR on blue airex foam pad inside // bars. Worked on static standing with vs without BUE support on bars with feet shldr width apart. After 1-2 minutes of standing, noted BLE tremors and pt reporting fatigue. Pt then completed the following there-ex with 3# dowel rod, in standing: -2x10 shldr press -2x10 chest press -2x10 bicep curl -2x10 rows -1x5 isometric holds in shldr flexion at 90 deg *therapist providing CGA throughout for safety and cues for correct  muscle activation and general sequencing.  W/c transport to ortho gym, performed stand<>pivot with CGA and on AD from w/c to Nustep. Completed x8 minutes of Nustep at workload of 4, focusing  on cardiovascular endurance and strengthening activity tolerance. Required brief seated rest break at 5 min 2/2 fatigue, VSS. Stand<>pivot with CGA back to w/c and then w/c transport back to his room, stand<>pivot transfer with supervision and RW to EOB. Sit>supine with supervision with bed features. Remained semi-reclined in bed with bed alarm on, needs in reach at end of session.   Therapy Documentation Precautions:  Precautions Precautions: Fall Precaution Comments: history of falls, impulsive, mild tremors BUE with intentional movements Restrictions Weight Bearing Restrictions: No  Therapy/Group: Individual Therapy  Roopa Graver P Teng Decou  PT 03/09/2020, 7:57 AM

## 2020-03-10 ENCOUNTER — Ambulatory Visit: Payer: Medicare Other | Admitting: Cardiology

## 2020-03-10 ENCOUNTER — Inpatient Hospital Stay (HOSPITAL_COMMUNITY): Payer: Medicare Other

## 2020-03-10 ENCOUNTER — Inpatient Hospital Stay (HOSPITAL_COMMUNITY): Payer: Medicare Other | Admitting: Occupational Therapy

## 2020-03-10 MED ORDER — AMIODARONE HCL 400 MG PO TABS: 400.0000 mg | ORAL_TABLET | Freq: Two times a day (BID) | ORAL | 0 refills | Status: DC

## 2020-03-10 MED ORDER — ESCITALOPRAM OXALATE 10 MG PO TABS: 10.0000 mg | ORAL_TABLET | Freq: Every day | ORAL | 0 refills | Status: AC

## 2020-03-10 MED ORDER — MIDAZOLAM HCL (PF) 5 MG/ML IJ SOLN
INTRAMUSCULAR | Status: AC
  Filled 2020-03-10: qty 2

## 2020-03-10 MED ORDER — AMIODARONE HCL 200 MG PO TABS: 200.0000 mg | ORAL_TABLET | Freq: Every day | ORAL | 0 refills | Status: DC

## 2020-03-10 MED ORDER — DIPHENHYDRAMINE HCL 50 MG/ML IJ SOLN
INTRAMUSCULAR | Status: AC
  Filled 2020-03-10: qty 1

## 2020-03-10 MED ORDER — PANTOPRAZOLE SODIUM 40 MG PO TBEC: 40.0000 mg | DELAYED_RELEASE_TABLET | Freq: Every day | ORAL | 0 refills | Status: AC

## 2020-03-10 MED ORDER — LIDOCAINE VISCOUS HCL 2 % MT SOLN
OROMUCOSAL | Status: AC
  Filled 2020-03-10: qty 15

## 2020-03-10 MED ORDER — ATORVASTATIN CALCIUM 20 MG PO TABS: 20.0000 mg | ORAL_TABLET | Freq: Every day | ORAL | 0 refills | Status: DC

## 2020-03-10 MED ORDER — TRAZODONE HCL 50 MG PO TABS: 50.0000 mg | ORAL_TABLET | Freq: Every day | ORAL | 0 refills | Status: DC

## 2020-03-10 MED ORDER — FOLIC ACID 1 MG PO TABS: 1.0000 mg | ORAL_TABLET | Freq: Every day | ORAL | 0 refills | Status: DC

## 2020-03-10 MED ORDER — NITROGLYCERIN 0.4 MG SL SUBL: 0.4000 mg | SUBLINGUAL_TABLET | SUBLINGUAL | 12 refills | Status: DC | PRN

## 2020-03-10 MED ORDER — ACETAMINOPHEN 325 MG PO TABS: 650.0000 mg | ORAL_TABLET | ORAL | Status: DC | PRN

## 2020-03-10 MED ORDER — FERROUS SULFATE 325 (65 FE) MG PO TABS: 325.0000 mg | ORAL_TABLET | Freq: Two times a day (BID) | ORAL | 0 refills | Status: DC

## 2020-03-10 MED ORDER — FENTANYL CITRATE (PF) 100 MCG/2ML IJ SOLN
INTRAMUSCULAR | Status: AC
  Filled 2020-03-10: qty 4

## 2020-03-10 MED ORDER — FUROSEMIDE 40 MG PO TABS: 40.0000 mg | ORAL_TABLET | Freq: Every day | ORAL | 0 refills | Status: DC | PRN

## 2020-03-10 MED ORDER — APIXABAN 5 MG PO TABS: 5.0000 mg | ORAL_TABLET | Freq: Two times a day (BID) | ORAL | 0 refills | Status: DC

## 2020-03-10 MED ORDER — TAMSULOSIN HCL 0.4 MG PO CAPS: 0.4000 mg | ORAL_CAPSULE | Freq: Every evening | ORAL | 0 refills | Status: DC

## 2020-03-10 NOTE — Progress Notes (Signed)
[facility] PHYSICAL MEDICINE & REHABILITATION PROGRESS NOTE  Subjective/Complaints: Patient seen sitting up working with therapies this morning.  He states he ate slept well overnight.  He states he feels better than before.  He states he is going home tomorrow.  ROS: Denies CP, SOB, N/V/D  Objective: Vital Signs: Blood pressure 135/73, pulse 63, temperature 97.8 F (36.6 C), resp. rate 20, height 6' (1.829 m), weight 102.1 kg, SpO2 95 %. No results found. No results for input(s): WBC, HGB, HCT, PLT in the last 72 hours. No results for input(s): NA, K, CL, CO2, GLUCOSE, BUN, CREATININE, CALCIUM in the last 72 hours.  Intake/Output Summary (Last 24 hours) at 03/10/2020 0912 Last data filed at 03/10/2020 0277 Gross per 24 hour  Intake 700 ml  Output 1400 ml  Net -700 ml        Physical Exam: BP 135/73 (BP Location: Right Arm)   Pulse 63   Temp 97.8 F (36.6 C)   Resp 20   Ht 6' (1.829 m)   Wt 102.1 kg   SpO2 95%   BMI 30.53 kg/m  Constitutional: No distress . Vital signs reviewed. HENT: Normocephalic.  Atraumatic. Eyes: EOMI. No discharge. Cardiovascular: No JVD.  Irregularly irregular. Respiratory: Normal effort.  No stridor.  Bilateral clear to auscultation. GI: Non-distended.  BS +. Skin: Warm and dry.  Intact. Psych: Normal mood.  Normal behavior. Musc: No edema in extremities.  No tenderness in extremities. Neuro: Alert Bilateral upper extremities: 5/5 proximal distal, appears unchanged Left lower extremity: 4+-5/5 proximal distal Right lower extremity: 4/5 hip flexion, knee extension, 4+/5 ankle dorsiflexion  Assessment/Plan: 1. Functional deficits secondary to debility which require 3+ hours per day of interdisciplinary therapy in a comprehensive inpatient rehab setting.  Physiatrist is providing close team supervision and 24 hour management of active medical problems listed below.  Physiatrist and rehab team continue to assess barriers to discharge/monitor  patient progress toward functional and medical goals   Care Tool:  Bathing    Body parts bathed by patient: Right arm, Left arm, Chest, Abdomen, Front perineal area, Buttocks, Right upper leg, Left upper leg, Face, Left lower leg, Right lower leg         Bathing assist Assist Level: Supervision/Verbal cueing     Upper Body Dressing/Undressing Upper body dressing   What is the patient wearing?: Pull over shirt    Upper body assist Assist Level: Set up assist    Lower Body Dressing/Undressing Lower body dressing      What is the patient wearing?: Pants     Lower body assist Assist for lower body dressing: Supervision/Verbal cueing     Toileting Toileting    Toileting assist Assist for toileting: Supervision/Verbal cueing Assistive Device Comment: urinal   Transfers Chair/bed transfer  Transfers assist  Chair/bed transfer activity did not occur: N/A  Chair/bed transfer assist level: Minimal Assistance - Patient > 75%     Locomotion Ambulation   Ambulation assist   Ambulation activity did not occur: N/A  Assist level: Minimal Assistance - Patient > 75% Assistive device: Walker-rolling Max distance: 136ft   Walk 10 feet activity   Assist  Walk 10 feet activity did not occur: N/A  Assist level: Minimal Assistance - Patient > 75% Assistive device: Walker-rolling   Walk 50 feet activity   Assist Walk 50 feet with 2 turns activity did not occur: N/A  Assist level: Minimal Assistance - Patient > 75% Assistive device: Walker-rolling    Walk 150 feet  activity   Assist Walk 150 feet activity did not occur: N/A         Walk 10 feet on uneven surface  activity   Assist Walk 10 feet on uneven surfaces activity did not occur: N/A         Wheelchair     Assist Will patient use wheelchair at discharge?: No             Wheelchair 50 feet with 2 turns activity    Assist            Wheelchair 150 feet activity      Assist           Medical Problem List and Plan: 1.Debilitysecondary toatrial fibrillation with RVR/multimedical  Continue CIR 2. Antithrombotics: -DVT/anticoagulation:Eliquis -antiplatelet therapy: N/A 3. Pain Management:Tylenol as needed 4. Mood:Lexapro 10 mg daily. Provide emotional support -antipsychotic agents: N/A 5. Neuropsych: This patientiscapable of making decisions on hisown behalf. 6. Skin/Wound Care:Routine skin checks 7. Fluids/Electrolytes/Nutrition:  8. PAF. Amiodarone400 mg twice daily x1 week then decrease to 200 mg dailyas per cardiology services. Continue Eliquis  Monitor with increased exertion  Repeat ECG showing A. fib with RVR on 10/29, however appears to be better rate controlled on 11/2  Discussed with cardiology, no further changes at present 9.  History of hypertension.   Controlled on 11/2 10. Chronic diastolic dysfunction. Monitor for any signs of fluid overload Filed Weights   03/08/20 0432 03/09/20 0600 03/10/20 0547  Weight: 104.1 kg 102.3 kg 102.1 kg   Stable on 11/2 11. Hyperlipidemia. Lipitor  12. GERD. Protonix 13. History of alcohol use. Counseling 14. Medical noncompliance. Provide counseling 15. Chronic anemia. Continue iron supplement.   Hemoglobin 11.3 on 10/29  Continue to monitor 16.  Allergies:    Claritin 17. Rash between legs- needs nystatin powder/cream 15.  Hypoalbuminemia  Supplement initiated on 10/29  LOS: 4 days A FACE TO FACE EVALUATION WAS PERFORMED  Darryl Diaz Lorie Phenix 03/10/2020, 9:12 AM

## 2020-03-10 NOTE — Discharge Instructions (Signed)
Inpatient Rehab Discharge Instructions  COTEY RAKES Discharge date and time: No discharge date for patient encounter.   Activities/Precautions/ Functional Status: Activity: activity as tolerated Diet: regular diet Wound Care: Routine skin checks Functional status:  ___ No restrictions     ___ Walk up steps independently ___ 24/7 supervision/assistance   ___ Walk up steps with assistance ___ Intermittent supervision/assistance  ___ Bathe/dress independently ___ Walk with walker     _x__ Bathe/dress with assistance ___ Walk Independently    ___ Shower independently ___ Walk with assistance    ___ Shower with assistance ___ No alcohol     ___ Return to work/school ________  Special Instructions: No driving smoking or alcohol    COMMUNITY REFERRALS UPON DISCHARGE:    Home Health:   PT & OT                Agency: [facility] MMHWK:[phone]   Medical Equipment/Items Ordered:NONE NEEDED HAS FROM PREVIOUS ADMITS                                                  My questions have been answered and I understand these instructions. I will adhere to these goals and the provided educational materials after my discharge from the hospital.  Patient/Caregiver Signature _______________________________ Date __________  Clinician Signature _______________________________________ Date __________  Please bring this form and your medication list with you to all your follow-up doctor's appointments.

## 2020-03-10 NOTE — Progress Notes (Addendum)
Patient ID: Darryl Diaz, male   DOB: 1940/10/05, 79 y.o.   MRN: [id number] MD to check with Cardiologist regarding clearance for discharge tomorrow. Pt set on going home tomorrow, due to stepdaughter and her uncle coming to stay with pt. MD to update team and PA  918 Am MD reports checked with Cardiology ok to discharge tomorrow. Have alerted team and PA.

## 2020-03-10 NOTE — Discharge Summary (Signed)
Physical Therapy Discharge Summary  Patient Details  Name: Darryl Diaz MRN: [id number] Date of Birth: [birth year]  Today's Date: 03/10/2020 PT Individual Time: 0800-0900 + 4356- [phone] PT Individual Time Calculation (min): 60 min  + 68 min  Patient has met 8 of 9 long term goals due to improved activity tolerance, improved balance, improved postural control and increased strength.  Patient to discharge at an ambulatory level Supervision.   Patient's care partner is independent to provide the necessary physical assistance at discharge.  Reasons goals not met: Pt required supervision and RW for functional transfers due to mild impulsivity and sometimes having difficulty with general sequencing with RW management and setup.  Recommendation:  Patient will benefit from ongoing skilled PT services in home health setting to continue to advance safe functional mobility, address ongoing impairments in general deconditioning, decreased balance, generalized weakness in order to minimize fall risk.  Equipment: No equipment provided. Pt owns all needed DME  Reasons for discharge: treatment goals met and discharge from hospital  Patient/family agrees with progress made and goals achieved: Yes  PT Discharge Precautions/Restrictions Precautions Precautions: Fall Precaution Comments: history of falls, mild impulsivity, mild tremors BUE with intentional movements Restrictions Weight Bearing Restrictions: No Vital Signs Therapy Vitals Temp: 97.8 F (36.6 C) Pulse Rate: 63 Resp: 20 BP: 135/73 Patient Position (if appropriate): Lying Oxygen Therapy SpO2: 95 % O2 Device: Room Air Pain    Reports no pain Vision/Perception  Perception Perception: Within Functional Limits Praxis Praxis: Intact  Cognition Overall Cognitive Status: Within Functional Limits for tasks assessed Arousal/Alertness: Awake/alert Orientation Level: Oriented X4 Attention: Sustained;Selective Sustained Attention:  Appears intact Selective Attention: Appears intact Awareness: Appears intact Problem Solving: Appears intact Safety/Judgment: Appears intact Sensation Sensation Light Touch: Appears Intact Hot/Cold: Appears Intact Proprioception: Appears Intact Stereognosis: Appears Intact Coordination Gross Motor Movements are Fluid and Coordinated: No Fine Motor Movements are Fluid and Coordinated: No Coordination and Movement Description: BUE intention tremors. Effortful movement that is exacerbated while multi-tasking Motor  Motor Motor: Within Functional Limits Motor - Skilled Clinical Observations: Symmetrical strength in x4 extremities, general deconditioning however much improved since date of evaluation  Mobility Bed Mobility Bed Mobility: Rolling Right;Rolling Left;Supine to Sit;Sit to Supine Rolling Right: Independent Rolling Left: Independent Supine to Sit: Independent Sit to Supine: Independent Transfers Transfers: Sit to Stand;Stand Pivot Transfers;Stand to Sit Sit to Stand: Independent with assistive device Stand to Sit: Independent with assistive device Stand Pivot Transfers: Supervision/Verbal cueing Stand Pivot Transfer Details: Verbal cues for safe use of DME/AE;Verbal cues for precautions/safety Stand Pivot Transfer Details (indicate cue type and reason): Mild impulsivity and sometimes difficulty with general sequencing. Supervision required for safeyt Transfer (Assistive device): Rolling walker Locomotion  Gait Ambulation: Yes Gait Assistance: Supervision/Verbal cueing Gait Distance (Feet): 200 Feet Assistive device: Rolling walker Gait Assistance Details: Verbal cues for safe use of DME/AE;Verbal cues for gait pattern;Verbal cues for precautions/safety;Verbal cues for technique;Verbal cues for sequencing Gait Gait: Yes Gait Pattern: Impaired Gait Pattern: Poor foot clearance - right;Poor foot clearance - left;Trunk flexed;Decreased step length - left;Decreased step  length - right;Decreased stride length (B knee valgus) Gait velocity: decreased Stairs / Additional Locomotion Stairs: Yes Stairs Assistance: Contact Guard/Touching assist Stair Management Technique: Two rails Number of Stairs: 12 Height of Stairs: 6 Wheelchair Mobility Wheelchair Mobility: No  Trunk/Postural Assessment  Cervical Assessment Cervical Assessment: Within Functional Limits Thoracic Assessment Thoracic Assessment: Within Functional Limits Lumbar Assessment Lumbar Assessment: Within Functional Limits Postural Control Postural Control: Within Functional Limits  Balance Balance Balance Assessed: Yes Standardized Balance Assessment Standardized Balance Assessment: Berg Balance Test Berg Balance Test Sit to Stand: Able to stand  independently using hands Standing Unsupported: Able to stand safely 2 minutes Sitting with Back Unsupported but Feet Supported on Floor or Stool: Able to sit safely and securely 2 minutes Stand to Sit: Sits safely with minimal use of hands Transfers: Able to transfer safely, definite need of hands Standing Unsupported with Eyes Closed: Able to stand 10 seconds with supervision Standing Ubsupported with Feet Together: Able to place feet together independently and stand for 1 minute with supervision From Standing, Reach Forward with Outstretched Arm: Can reach confidently >25 cm (10") From Standing Position, Pick up Object from Floor: Able to pick up shoe, needs supervision From Standing Position, Turn to Look Behind Over each Shoulder: Looks behind from both sides and weight shifts well Turn 360 Degrees: Able to turn 360 degrees safely but slowly Standing Unsupported, Alternately Place Feet on Step/Stool: Able to complete >2 steps/needs minimal assist Standing Unsupported, One Foot in Front: Able to take small step independently and hold 30 seconds Standing on One Leg: Unable to try or needs assist to prevent fall Total Score: 40 Static Sitting  Balance Static Sitting - Level of Assistance: 7: Independent Dynamic Sitting Balance Dynamic Sitting - Level of Assistance: 5: Stand by assistance Static Standing Balance Static Standing - Level of Assistance: 6: Modified independent (Device/Increase time) Dynamic Standing Balance Dynamic Standing - Level of Assistance: 5: Stand by assistance Extremity Assessment  RUE Assessment RUE Assessment: Within Functional Limits General Strength Comments: Grossly 4+/5 LUE Assessment LUE Assessment: Within Functional Limits General Strength Comments: Grossly 4+/5 RLE Assessment RLE Assessment: Within Functional Limits General Strength Comments: Grossly 4+/5 LLE Assessment LLE Assessment: Within Functional Limits General Strength Comments: Grossly 4+/5  Skilled Intervention:  1st session: Pt received supine in bed, agreeable to PT session without c/o pain. Pt eager to begin therapy and reinforces readiness to DC home tomorrow. Supine<>sit with supervision and HOB flat. Stand<>pivot with supervision and RW from EOB to w/c, cues for safety awareness, safety approach, and general sequencing. W/c transport for energy conservation from his room to main hallway. Ambulated 120f with supervision and RW on level surfaces, demo's decreased cadence and moderate reliance of BUE through RW; no LOB or knee buckling. Stand<>sit with supervision and RW to EOM. Performed BERG, requirign several rest breaks throughout BERG for recovery, scoring 40/56. Score indicative of increased falls risk; falls risk to be addressed in follow up therapy services as well as continued rehabilitation during his stay. Reinforced importance of having RW at all times while ambulating as balacne deficits Pt reporting need to void after completion of BERG, therefore, ambulated back to his room, 2046f with supervision and RW. Stand>sit with supervision to 3-1 BSC. Pt continent of bladder and bowels, charted. Sit>stand with supervision to [facility]W  [facility] 1036[facility] supervision and RW to sink where he performed hand hygiene with supervision and no BUE support. Ambulated back to EOB with RW and supervision. Performed repeated sit<>stands with supervision and RW, x10 reps, cues for controlled lowering during transfer and guided breathing. Stand<>pivot transfer with supervision and RW from EOB to recliner. He remained seated in recliner at end of session with needs in reach, chair alarm on, made comfortable.  Patient demonstrates increased fall risk as noted by score of 40/56 on Berg Balance Scale.  (<36= high risk for falls, close to 100%; 37-45 significant >80%; 46-51 moderate >  50%; 52-55 lower >25%)  2nd session: Pt received supine in bed, awake and agreeable to PT session without c/o pain. Focus of session to review home safety training, DC preparation, safety awareness training, and review any concerns/questions for upcoming DC tomorrow. Pt eager and motivated to return home where he reports 24/7 S/A will be available. Supine<>sit indep and sit<>stand mod I with RW. He ambulated ~60f to bathroom 3-1 BSC where he was continent of bladder, charted. Ambulated 2059fwith supervision and RW from his room to main therapy gym. Pt reports he occasionally uses rollator at home rather than RW. Therefore, practiced gait training with rollator and reviewed safety precautions with rollator as well. Pt with good understanding of proper use of rollator. Performed car transfer with supervision and rollator, good technique. He ambulated up/down x1014famp with supervision and rollator, no LOB. Then ambulated 175f60fth supervision and rollator back to main gym. Upon sitting, he reports need to have BM. Therefore, ambulated back to his room, ~200ft6fth supervision and rollator, stand>sit on 3-[facility] and rollator. Pt continent of bowel, charted. Ambulated back to main therapy gym with supervision and rollator. After seated rest, he  negotiated up/down x12 steps with close supervision and bilateral hand rail support, reciprocal pattern with ascent and step-to pattern with descent. No LOB noted. W/c transport to dayroom gym, performed Nustep for x8 consecutive minutes at workload of 4, focusing on R knee/hip strengthening as well as cardiovascular endurance. Slight dyspnea on exertion with completion of this, reports moderate fatigue. W/c transport back to his room for energy conservation purposes. Stand<>pivot with CGA and no AD from w/c to EOB. Sit>supine indep without bed features. He remained semi-reclined in bed with needs in reach at end of session, bed alarm on. Pt without concerns or questions regarding upcoming DC, pleasantly appreciative of therapy services.   Zyen Triggs P Danny Yackley PT 03/10/2020, 8:00 AM

## 2020-03-10 NOTE — Progress Notes (Signed)
Occupational Therapy Discharge Summary  Patient Details  Name: Darryl Diaz MRN: [id number] Date of Birth: 23-[name]-1941  Today's Date: 03/10/2020 OT Individual Time: 1000-1116 OT Individual Time Calculation (min): 76 min    Patient has met 9 of 12 long term goals due to improved balance, ability to compensate for deficits and improved awareness.  Patient to discharge at overall Supervision to modified independent level.  Patient's care partner is independent to provide the necessary cognitive assistance at discharge.    Reasons goals not met: Pt exhibits mild impulsivity needing reinforcement of safety techniques during functional mobility and use of AD.  Recommendation:  Patient will benefit from ongoing skilled OT services in home health setting to continue to advance functional skills in the area of BADL and iADL.  Pt has family to assist until Sunday and then will be living alone thereafter and will be responsible for IADLs. Pt would benefit from in-home environmental assessment as well.  Equipment: No equipment provided  Reasons for discharge: treatment goals met and discharge from hospital  Patient/family agrees with progress made and goals achieved: Yes  OT Discharge Precautions/Restrictions  Precautions Precautions: Fall Precaution Comments: history of falls, mild impulsivity, mild tremors BUE with intentional movements Restrictions Weight Bearing Restrictions: No Pain Pain Assessment Pain Scale: 0-10 Pain Score: 0-No pain ADL ADL Grooming: Modified independent Where Assessed-Grooming: Standing at sink Upper Body Bathing: Modified independent Where Assessed-Upper Body Bathing: Shower Lower Body Bathing: Supervision/safety Where Assessed-Lower Body Bathing: Shower Upper Body Dressing: Modified independent (Device) Where Assessed-Upper Body Dressing: Edge of bed Lower Body Dressing: Modified independent Where Assessed-Lower Body Dressing: Edge of bed Toileting:  Supervision/safety Where Assessed-Toileting: Other (Comment) (urinal sitting EOB) Toilet Transfer: Modified independent Toilet Transfer Method: Counselling psychologist: Other (comment) (3 in 1 commode over toilet) Social research officer, government: Close supervision Social research officer, government Method: Heritage manager: Radio broadcast assistant Vision Baseline Vision/History: Wears glasses Wears Glasses: At all times Patient Visual Report: Blurring of vision;Other (comment) (secondary to retinal detachment surgery right eye; plans to get lens adjustment soon) Vision Assessment?: No apparent visual deficits Perception  Perception: Within Functional Limits Praxis Praxis: Intact Cognition Overall Cognitive Status: Within Functional Limits for tasks assessed Arousal/Alertness: Awake/alert Sustained Attention: Appears intact Selective Attention: Appears intact Memory: Appears intact Awareness: Appears intact Problem Solving: Appears intact Safety/Judgment: Appears intact Sensation Sensation Light Touch: Appears Intact Hot/Cold: Appears Intact Proprioception: Appears Intact Stereognosis: Not tested Coordination Gross Motor Movements are Fluid and Coordinated: No Fine Motor Movements are Fluid and Coordinated: No Coordination and Movement Description: BUE intention tremors. Effortful movement that is exacerbated while multi-tasking Motor  Motor Motor: Within Functional Limits Motor - Skilled Clinical Observations: Symmetrical strength in x4 extremities, general deconditioning however much improved since date of evaluation Mobility  Transfers Sit to Stand: Independent with assistive device Stand to Sit: Independent with assistive device  Trunk/Postural Assessment  Cervical Assessment Cervical Assessment: Within Functional Limits Thoracic Assessment Thoracic Assessment: Within Functional Limits Lumbar Assessment Lumbar Assessment: Within Functional Limits Postural  Control Postural Control: Within Functional Limits  Balance Balance Balance Assessed: Yes Static Sitting Balance Static Sitting - Level of Assistance: 7: Independent Dynamic Sitting Balance Dynamic Sitting - Level of Assistance: 5: Stand by assistance Static Standing Balance Static Standing - Level of Assistance: 6: Modified independent (Device/Increase time) Dynamic Standing Balance Dynamic Standing - Level of Assistance: 5: Stand by assistance Extremity/Trunk Assessment RUE Assessment RUE Assessment: Within Functional Limits General Strength Comments: Grossly 4+/5 LUE Assessment LUE Assessment: Within  Functional Limits General Strength Comments: Grossly 4+/5   Darryl Diaz 03/10/2020, 12:26 PM

## 2020-03-10 NOTE — Progress Notes (Addendum)
Inpatient Rehabilitation Care Coordinator  Discharge Note  The overall goal for the admission was met for:   Discharge location: [place]  Length of Stay: Yes-5 DAYS  Discharge activity level: Yes-MOD/I LEVEL  Home/community participation: Yes  Services provided included: MD, RD, PT, OT, RN, CM, TR, Pharmacy and SW  Financial Services: Medicare and Private Insurance: [place]  Follow-up services arranged: Home Health: [facility] & OT and Patient/Family request agency HH: [facility], DME: NO NEEDS HAS FROM PREVIOUS ADMITS  Comments (or additional information):[place] AND HER UNCLE-PT'S FRIEND COMING INTO TOWN TONIGHT THEN HIS SON WILL BE COMING ON SAT. SISTER AND BROTHER [place] WILL STAY WITH UNTIL OTHERS GET INTO TOWN TONIGHT. SON TO STAY UNITL AFTER THANKSGIVING.  Patient/Family verbalized understanding of follow-up arrangements: Yes  Individual responsible for coordination of the follow-up plan: SELF [phone] OR CELL [phone]  Confirmed correct DME delivered: Elease Hashimoto 03/10/2020    Elease Hashimoto

## 2020-03-11 ENCOUNTER — Other Ambulatory Visit: Payer: Self-pay

## 2020-03-11 NOTE — Patient Care Conference (Signed)
Inpatient RehabilitationTeam Conference and Plan of Care Update Date: 03/11/2020   Time: 11:11 AM    Patient Name: Darryl Diaz      Medical Record Number: [id number]  Date of Birth: January 18, 1941 Sex: Male         Room/Bed: 4M05C/4M05C-01 Payor Info: Payor: MEDICARE / Plan: MEDICARE PART A AND B / Product Type: *No Product type* /    Admit Date/Time:  03/06/2020 12:28 AM  Primary Diagnosis:  [facility] Problems: Principal Problem:   Debility Active Problems:   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 (HCC)   Allergies   Anemia, chronic disease   Chronic diastolic congestive heart failure (HCC)   History of hypertension   Atrial fibrillation with RVR [facility])    Expected Discharge Date: Expected Discharge Date: 03/11/20  Team Members Present: Physician leading conference: Dr. Delice Lesch Nurse Present: Frances Maywood, RN PT Present: Estevan Ryder, PT OT Present: Leretha Pol, OT PPS Coordinator present : Gunnar Fusi, SLP     Current Status/Progress Goal Weekly Team Focus  Bowel/Bladder             Swallow/Nutrition/ Hydration             ADL's   supervision/mod I  supervision/mod I  DC planning, self care training, safety awareness, standing balance and functional transfers   Mobility   Supervision/mod I  mod I bed mobility, mod I transfers, supervision gait >154ft with RW  DC planning, endurance training, general strengthening, safety awareness   Communication             Safety/Cognition/ Behavioral Observations            Pain             Skin               Discharge Planning:  Home with stepdaughter and her uncle coming until Sun then son coming on Sat. Will have 24/7 supervision until after Thanksgiving. Pt has all equipment and home health arranged   Team Discussion: Good support at home with call alert system. Supervision -mod I overall mild safety awareness issues. Patient on target to meet rehab goals: yes, supervision -  mod I goals set  *See Care Plan and progress notes for long and short-term goals.   Revisions to Treatment Plan:   Teaching Needs: Transfers, toileting, medications, safety, etc.  Current Barriers to Discharge: None  Possible Resolutions to Barriers:     Medical Summary Current Status: Debility secondary to atrial fibrillation with RVR/multimedical  Barriers to Discharge: Other (comments)  Barriers to Discharge Comments: Ready for discharge with support Possible Resolutions to Barriers/Weekly Focus: Therapies, edu       I attest that I was present, lead the team conference, and concur with the assessment and plan of the team.   [name] B 03/11/2020, 1:51 PM

## 2020-03-11 NOTE — Progress Notes (Signed)
[facility] PHYSICAL MEDICINE & REHABILITATION PROGRESS NOTE  Subjective/Complaints: Patient seen sitting up in his chair this AM.  He states he slept well overnight.  He is ready for discharge.   ROS: Denies CP, SOB, N/V/D  Objective: Vital Signs: Blood pressure (!) 149/74, pulse 64, temperature 97.6 F (36.4 C), temperature source Oral, resp. rate 18, height 6' (1.829 m), weight 102.5 kg, SpO2 98 %. No results found. No results for input(s): WBC, HGB, HCT, PLT in the last 72 hours. No results for input(s): NA, K, CL, CO2, GLUCOSE, BUN, CREATININE, CALCIUM in the last 72 hours.  Intake/Output Summary (Last 24 hours) at 03/11/2020 0921 Last data filed at 03/10/2020 1835 Gross per 24 hour  Intake 482 ml  Output 625 ml  Net -143 ml        Physical Exam: BP (!) 149/74 (BP Location: Right Arm)   Pulse 64   Temp 97.6 F (36.4 C) (Oral)   Resp 18   Ht 6' (1.829 m)   Wt 102.5 kg   SpO2 98%   BMI 30.65 kg/m  Constitutional: No distress . Vital signs reviewed. Obese.  HENT: Normocephalic.  Atraumatic. Eyes: EOMI. No discharge. Cardiovascular: No JVD.  Irregularly irregular.  Respiratory: Normal effort.  No stridor.  Bilateral clear to auscultation. GI: Non-distended.  BS +. Skin: Warm and dry.  Intact. Psych: Normal mood.  Normal behavior. Musc: No edema in extremities.  No tenderness in extremities. Neuro: Alert Bilateral upper extremities: 5/5 proximal distal, appears unchanged Left lower extremity: 4+-5/5 proximal distal Right lower extremity: 4+/5 hip flexion, knee extension, 4+/5 ankle dorsiflexion  Assessment/Plan: 1. Functional deficits secondary to debility which require 3+ hours per day of interdisciplinary therapy in a comprehensive inpatient rehab setting.  Physiatrist is providing close team supervision and 24 hour management of active medical problems listed below.  Physiatrist and rehab team continue to assess barriers to discharge/monitor patient progress  toward functional and medical goals   Care Tool:  Bathing    Body parts bathed by patient: Right arm, Left arm, Chest, Abdomen, Front perineal area, Buttocks, Right upper leg, Left upper leg, Face, Left lower leg, Right lower leg         Bathing assist Assist Level: Supervision/Verbal cueing     Upper Body Dressing/Undressing Upper body dressing   What is the patient wearing?: Pull over shirt    Upper body assist Assist Level: Independent    Lower Body Dressing/Undressing Lower body dressing      What is the patient wearing?: Pants     Lower body assist Assist for lower body dressing: Independent with assitive device     Toileting Toileting    Toileting assist Assist for toileting: Independent with assistive device Assistive Device Comment: urinal   Transfers Chair/bed transfer  Transfers assist  Chair/bed transfer activity did not occur: N/A  Chair/bed transfer assist level: Supervision/Verbal cueing     Locomotion Ambulation   Ambulation assist   Ambulation activity did not occur: N/A  Assist level: Supervision/Verbal cueing Assistive device: Walker-rolling Max distance: 265ft   Walk 10 feet activity   Assist  Walk 10 feet activity did not occur: N/A  Assist level: Supervision/Verbal cueing Assistive device: Walker-rolling   Walk 50 feet activity   Assist Walk 50 feet with 2 turns activity did not occur: N/A  Assist level: Supervision/Verbal cueing Assistive device: Walker-rolling    Walk 150 feet activity   Assist Walk 150 feet activity did not occur: N/A  Assist level:  Supervision/Verbal cueing Assistive device: Walker-rolling    Walk 10 feet on uneven surface  activity   Assist Walk 10 feet on uneven surfaces activity did not occur: N/A   Assist level: Supervision/Verbal cueing Assistive device: Rollator   Wheelchair     Assist Will patient use wheelchair at discharge?: No             Wheelchair 50 feet  with 2 turns activity    Assist            Wheelchair 150 feet activity     Assist           Medical Problem List and Plan: 1.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 Please see conference note from today as well.   DC today  Will see patient for hospital follow up in 1 month post-discharge 2. Antithrombotics: -DVT/anticoagulation:Eliquis -antiplatelet therapy: N/A 3. Pain Management:Tylenol as needed 4. Mood:Lexapro 10 mg daily. Provide emotional support -antipsychotic agents: N/A 5. Neuropsych: This patientiscapable of making decisions on hisown behalf. 6. Skin/Wound Care:Routine skin checks 7. Fluids/Electrolytes/Nutrition:  8. PAF. Amiodarone400 mg twice daily x1 week then decrease to 200 mg dailyas per cardiology services. Continue Eliquis  Monitor with increased exertion  Repeat ECG showing A. fib with RVR on 10/29, however appears to be better rate controlled on 11/2  Discussed with cardiology, no further changes at present 9.  History of hypertension.   Had been controlled, slightly elevated this AM, monitor in ambulatory setting 10. Chronic diastolic dysfunction. Monitor for any signs of fluid overload Filed Weights   03/09/20 0600 03/10/20 0547 03/11/20 0500  Weight: 102.3 kg 102.1 kg 102.5 kg   Stable on 11/3 11. Hyperlipidemia. Lipitor  12. GERD. Protonix 13. History of alcohol use. Counseling 14. Medical noncompliance. Provide counseling 15. Chronic anemia. Continue iron supplement.   Hemoglobin 11.3 on 10/29  Continue to monitor 16.  Allergies:    Claritin 17. Rash between legs- needs nystatin powder/cream 15.  Hypoalbuminemia  Supplement initiated on 10/29  > 30 minutes spent in total in  discharge planning between myself and PA regarding aforementioned, as well discussion regarding DME equipment, follow-up appointments, follow-up therapies, discharge medications, discharge recommendations  LOS: 5 days A FACE TO FACE EVALUATION WAS PERFORMED  Darryl Diaz Lorie Phenix 03/11/2020, 9:21 AM

## 2020-03-13 ENCOUNTER — Other Ambulatory Visit: Payer: Self-pay | Admitting: *Deleted

## 2020-03-13 NOTE — Patient Outreach (Signed)
[facility]) Care Management  03/13/2020  TIRTH COTHRON 01/25/1941 [id number]   Telephone Assessment  Initial outreach unsuccessful. RN able to leave a HIPAA approved voice message requesting a call back.  Will rescheduled another outreach over the next week for pending [facility] services.  [name], RN Care Management Coordinator [place] Office [phone]

## 2020-03-16 ENCOUNTER — Other Ambulatory Visit: Payer: Self-pay

## 2020-03-16 ENCOUNTER — Emergency Department (HOSPITAL_COMMUNITY): Payer: Medicare Other

## 2020-03-16 ENCOUNTER — Inpatient Hospital Stay (HOSPITAL_COMMUNITY)
Admission: EM | Admit: 2020-03-16 | Discharge: 2020-03-20 | DRG: 309 | Disposition: A | Discharge status: Home Health | Payer: Medicare Other | Attending: Internal Medicine | Admitting: Internal Medicine

## 2020-03-16 ENCOUNTER — Encounter (HOSPITAL_COMMUNITY): Payer: Self-pay

## 2020-03-16 DIAGNOSIS — R52 Pain, unspecified: Secondary | ICD-10-CM | POA: Diagnosis not present

## 2020-03-16 DIAGNOSIS — I4819 Other persistent atrial fibrillation: Principal | ICD-10-CM | POA: Diagnosis present

## 2020-03-16 DIAGNOSIS — I2510 Atherosclerotic heart disease of native coronary artery without angina pectoris: Secondary | ICD-10-CM | POA: Diagnosis present

## 2020-03-16 DIAGNOSIS — Z91048 Other nonmedicinal substance allergy status: Secondary | ICD-10-CM

## 2020-03-16 DIAGNOSIS — I4891 Unspecified atrial fibrillation: Secondary | ICD-10-CM | POA: Diagnosis not present

## 2020-03-16 DIAGNOSIS — S3991XA Unspecified injury of abdomen, initial encounter: Secondary | ICD-10-CM | POA: Diagnosis not present

## 2020-03-16 DIAGNOSIS — G4700 Insomnia, unspecified: Secondary | ICD-10-CM | POA: Diagnosis present

## 2020-03-16 DIAGNOSIS — W19XXXA Unspecified fall, initial encounter: Secondary | ICD-10-CM | POA: Diagnosis not present

## 2020-03-16 DIAGNOSIS — D509 Iron deficiency anemia, unspecified: Secondary | ICD-10-CM | POA: Diagnosis present

## 2020-03-16 DIAGNOSIS — L22 Diaper dermatitis: Secondary | ICD-10-CM | POA: Diagnosis present

## 2020-03-16 DIAGNOSIS — Z20822 Contact with and (suspected) exposure to covid-19: Secondary | ICD-10-CM | POA: Diagnosis present

## 2020-03-16 DIAGNOSIS — I252 Old myocardial infarction: Secondary | ICD-10-CM

## 2020-03-16 DIAGNOSIS — I10 Essential (primary) hypertension: Secondary | ICD-10-CM | POA: Diagnosis present

## 2020-03-16 DIAGNOSIS — G4733 Obstructive sleep apnea (adult) (pediatric): Secondary | ICD-10-CM | POA: Diagnosis present

## 2020-03-16 DIAGNOSIS — R001 Bradycardia, unspecified: Secondary | ICD-10-CM | POA: Diagnosis not present

## 2020-03-16 DIAGNOSIS — A020 Salmonella enteritis: Secondary | ICD-10-CM | POA: Diagnosis present

## 2020-03-16 DIAGNOSIS — Z79899 Other long term (current) drug therapy: Secondary | ICD-10-CM

## 2020-03-16 DIAGNOSIS — Z8674 Personal history of sudden cardiac arrest: Secondary | ICD-10-CM

## 2020-03-16 DIAGNOSIS — N400 Enlarged prostate without lower urinary tract symptoms: Secondary | ICD-10-CM | POA: Diagnosis present

## 2020-03-16 DIAGNOSIS — R197 Diarrhea, unspecified: Secondary | ICD-10-CM | POA: Diagnosis present

## 2020-03-16 DIAGNOSIS — K219 Gastro-esophageal reflux disease without esophagitis: Secondary | ICD-10-CM | POA: Diagnosis present

## 2020-03-16 DIAGNOSIS — I959 Hypotension, unspecified: Secondary | ICD-10-CM | POA: Diagnosis not present

## 2020-03-16 DIAGNOSIS — K760 Fatty (change of) liver, not elsewhere classified: Secondary | ICD-10-CM | POA: Diagnosis present

## 2020-03-16 DIAGNOSIS — M549 Dorsalgia, unspecified: Secondary | ICD-10-CM | POA: Diagnosis not present

## 2020-03-16 DIAGNOSIS — B370 Candidal stomatitis: Secondary | ICD-10-CM | POA: Diagnosis present

## 2020-03-16 DIAGNOSIS — Z86010 Personal history of colonic polyps: Secondary | ICD-10-CM

## 2020-03-16 DIAGNOSIS — Z96641 Presence of right artificial hip joint: Secondary | ICD-10-CM | POA: Diagnosis present

## 2020-03-16 DIAGNOSIS — K7030 Alcoholic cirrhosis of liver without ascites: Secondary | ICD-10-CM | POA: Diagnosis present

## 2020-03-16 DIAGNOSIS — M1990 Unspecified osteoarthritis, unspecified site: Secondary | ICD-10-CM | POA: Diagnosis present

## 2020-03-16 DIAGNOSIS — R296 Repeated falls: Secondary | ICD-10-CM | POA: Diagnosis present

## 2020-03-16 DIAGNOSIS — E871 Hypo-osmolality and hyponatremia: Secondary | ICD-10-CM | POA: Diagnosis not present

## 2020-03-16 DIAGNOSIS — Y92009 Unspecified place in unspecified non-institutional (private) residence as the place of occurrence of the external cause: Secondary | ICD-10-CM | POA: Diagnosis not present

## 2020-03-16 DIAGNOSIS — Z8249 Family history of ischemic heart disease and other diseases of the circulatory system: Secondary | ICD-10-CM

## 2020-03-16 DIAGNOSIS — I5032 Chronic diastolic (congestive) heart failure: Secondary | ICD-10-CM | POA: Diagnosis present

## 2020-03-16 DIAGNOSIS — R000 Tachycardia, unspecified: Secondary | ICD-10-CM | POA: Diagnosis not present

## 2020-03-16 DIAGNOSIS — K8020 Calculus of gallbladder without cholecystitis without obstruction: Secondary | ICD-10-CM | POA: Diagnosis not present

## 2020-03-16 DIAGNOSIS — F1021 Alcohol dependence, in remission: Secondary | ICD-10-CM | POA: Diagnosis present

## 2020-03-16 DIAGNOSIS — N179 Acute kidney failure, unspecified: Secondary | ICD-10-CM | POA: Diagnosis not present

## 2020-03-16 DIAGNOSIS — Z880 Allergy status to penicillin: Secondary | ICD-10-CM

## 2020-03-16 DIAGNOSIS — I110 Hypertensive heart disease with heart failure: Secondary | ICD-10-CM | POA: Diagnosis present

## 2020-03-16 DIAGNOSIS — E876 Hypokalemia: Secondary | ICD-10-CM | POA: Diagnosis not present

## 2020-03-16 DIAGNOSIS — Z8261 Family history of arthritis: Secondary | ICD-10-CM

## 2020-03-16 DIAGNOSIS — F329 Major depressive disorder, single episode, unspecified: Secondary | ICD-10-CM | POA: Diagnosis present

## 2020-03-16 DIAGNOSIS — W010XXA Fall on same level from slipping, tripping and stumbling without subsequent striking against object, initial encounter: Secondary | ICD-10-CM | POA: Diagnosis present

## 2020-03-16 DIAGNOSIS — Z7901 Long term (current) use of anticoagulants: Secondary | ICD-10-CM

## 2020-03-16 DIAGNOSIS — K7460 Unspecified cirrhosis of liver: Secondary | ICD-10-CM | POA: Diagnosis not present

## 2020-03-16 DIAGNOSIS — Z87891 Personal history of nicotine dependence: Secondary | ICD-10-CM

## 2020-03-16 DIAGNOSIS — K449 Diaphragmatic hernia without obstruction or gangrene: Secondary | ICD-10-CM | POA: Diagnosis not present

## 2020-03-16 DIAGNOSIS — E785 Hyperlipidemia, unspecified: Secondary | ICD-10-CM | POA: Diagnosis present

## 2020-03-16 DIAGNOSIS — B379 Candidiasis, unspecified: Secondary | ICD-10-CM | POA: Diagnosis present

## 2020-03-16 DIAGNOSIS — A029 Salmonella infection, unspecified: Secondary | ICD-10-CM | POA: Diagnosis present

## 2020-03-16 LAB — TYPE AND SCREEN
ABO/RH(D): O POS
Antibody Screen: NEGATIVE

## 2020-03-16 LAB — CBC WITH DIFFERENTIAL/PLATELET
Abs Immature Granulocytes: 0.04 10*3/uL (ref 0.00–0.07)
Basophils Absolute: 0 10*3/uL (ref 0.0–0.1)
Basophils Relative: 0 %
Eosinophils Absolute: 0.2 10*3/uL (ref 0.0–0.5)
Eosinophils Relative: 2 %
HCT: 33.8 % — ABNORMAL LOW (ref 39.0–52.0)
Hemoglobin: 11.2 g/dL — ABNORMAL LOW (ref 13.0–17.0)
Immature Granulocytes: 0 %
Lymphocytes Relative: 4 %
Lymphs Abs: 0.5 10*3/uL — ABNORMAL LOW (ref 0.7–4.0)
MCH: 31.4 pg (ref 26.0–34.0)
MCHC: 33.1 g/dL (ref 30.0–36.0)
MCV: 94.7 fL (ref 80.0–100.0)
Monocytes Absolute: 0.8 10*3/uL (ref 0.1–1.0)
Monocytes Relative: 7 %
Neutro Abs: 9.9 10*3/uL — ABNORMAL HIGH (ref 1.7–7.7)
Neutrophils Relative %: 87 %
Platelets: 212 10*3/uL (ref 150–400)
RBC: 3.57 MIL/uL — ABNORMAL LOW (ref 4.22–5.81)
RDW: 14.2 % (ref 11.5–15.5)
WBC: 11.5 10*3/uL — ABNORMAL HIGH (ref 4.0–10.5)
nRBC: 0 % (ref 0.0–0.2)

## 2020-03-16 LAB — COMPREHENSIVE METABOLIC PANEL
ALT: 15 U/L (ref 0–44)
AST: 32 U/L (ref 15–41)
Albumin: 3.3 g/dL — ABNORMAL LOW (ref 3.5–5.0)
Alkaline Phosphatase: 85 U/L (ref 38–126)
Anion gap: 12 (ref 5–15)
BUN: 14 mg/dL (ref 8–23)
CO2: 23 mmol/L (ref 22–32)
Calcium: 8.5 mg/dL — ABNORMAL LOW (ref 8.9–10.3)
Chloride: 91 mmol/L — ABNORMAL LOW (ref 98–111)
Creatinine, Ser: 1.15 mg/dL (ref 0.61–1.24)
GFR, Estimated: >60 mL/min (ref >60)
Glucose, Bld: 117 mg/dL — ABNORMAL HIGH (ref 70–99)
Potassium: 3.5 mmol/L (ref 3.5–5.1)
Sodium: 126 mmol/L — ABNORMAL LOW (ref 135–145)
Total Bilirubin: 1.2 mg/dL (ref 0.3–1.2)
Total Protein: 6.1 g/dL — ABNORMAL LOW (ref 6.5–8.1)

## 2020-03-16 LAB — GASTROINTESTINAL PANEL BY PCR, STOOL (REPLACES STOOL CULTURE)

## 2020-03-16 LAB — RESPIRATORY PANEL BY RT PCR (FLU A&B, COVID)
Influenza A by PCR: NEGATIVE
Influenza B by PCR: NEGATIVE
SARS Coronavirus 2 by RT PCR: NEGATIVE

## 2020-03-16 LAB — PROTIME-INR
INR: 1.3 — ABNORMAL HIGH (ref 0.8–1.2)
Prothrombin Time: 15.8 seconds — ABNORMAL HIGH (ref 11.4–15.2)

## 2020-03-16 MED ORDER — LORAZEPAM 1 MG PO TABS: 1.0000 mg | ORAL_TABLET | ORAL | Status: AC | PRN

## 2020-03-16 MED ORDER — AMIODARONE HCL IN DEXTROSE 360-4.14 MG/200ML-% IV SOLN
30.0000 mg/h | INTRAVENOUS | Status: DC
  Administered 2020-03-16 (×2): 30 mg/h via INTRAVENOUS
  Filled 2020-03-16 (×2): qty 200

## 2020-03-16 MED ORDER — TAMSULOSIN HCL 0.4 MG PO CAPS
0.4000 mg | ORAL_CAPSULE | Freq: Every evening | ORAL | Status: DC
  Administered 2020-03-16 – 2020-03-19 (×4): 0.4 mg via ORAL
  Filled 2020-03-16 (×4): qty 1

## 2020-03-16 MED ORDER — APIXABAN 5 MG PO TABS
5.0000 mg | ORAL_TABLET | Freq: Two times a day (BID) | ORAL | Status: DC
  Administered 2020-03-16 – 2020-03-20 (×9): 5 mg via ORAL
  Filled 2020-03-16 (×9): qty 1

## 2020-03-16 MED ORDER — ADULT MULTIVITAMIN W/MINERALS CH
1.0000 | ORAL_TABLET | Freq: Every day | ORAL | Status: DC
  Administered 2020-03-16 – 2020-03-20 (×5): 1 via ORAL
  Filled 2020-03-16 (×5): qty 1

## 2020-03-16 MED ORDER — ONDANSETRON HCL 4 MG/2ML IJ SOLN: 4.0000 mg | Freq: Four times a day (QID) | INTRAMUSCULAR | Status: DC | PRN

## 2020-03-16 MED ORDER — FOLIC ACID 1 MG PO TABS
1.0000 mg | ORAL_TABLET | Freq: Every day | ORAL | Status: DC
  Administered 2020-03-16 – 2020-03-20 (×5): 1 mg via ORAL
  Filled 2020-03-16 (×5): qty 1

## 2020-03-16 MED ORDER — SACCHAROMYCES BOULARDII 250 MG PO CAPS
250.0000 mg | ORAL_CAPSULE | Freq: Two times a day (BID) | ORAL | Status: DC
  Administered 2020-03-16 – 2020-03-20 (×9): 250 mg via ORAL
  Filled 2020-03-16 (×10): qty 1

## 2020-03-16 MED ORDER — THIAMINE HCL 100 MG PO TABS
100.0000 mg | ORAL_TABLET | Freq: Every day | ORAL | Status: DC
  Administered 2020-03-16 – 2020-03-20 (×5): 100 mg via ORAL
  Filled 2020-03-16 (×5): qty 1

## 2020-03-16 MED ORDER — FERROUS SULFATE 325 (65 FE) MG PO TABS
325.0000 mg | ORAL_TABLET | Freq: Two times a day (BID) | ORAL | Status: DC
  Administered 2020-03-16 – 2020-03-20 (×9): 325 mg via ORAL
  Filled 2020-03-16 (×9): qty 1

## 2020-03-16 MED ORDER — AMIODARONE HCL IN DEXTROSE 360-4.14 MG/200ML-% IV SOLN
60.0000 mg/h | INTRAVENOUS | Status: AC
  Administered 2020-03-16: 60 mg/h via INTRAVENOUS
  Filled 2020-03-16: qty 200

## 2020-03-16 MED ORDER — LORATADINE 10 MG PO TABS
10.0000 mg | ORAL_TABLET | Freq: Every day | ORAL | Status: DC
  Administered 2020-03-16 – 2020-03-20 (×5): 10 mg via ORAL
  Filled 2020-03-16 (×5): qty 1

## 2020-03-16 MED ORDER — NYSTATIN 100000 UNIT/ML MT SUSP
5.0000 mL | Freq: Four times a day (QID) | OROMUCOSAL | Status: DC
  Administered 2020-03-16 – 2020-03-20 (×17): 500000 [IU] via ORAL
  Filled 2020-03-16 (×19): qty 5

## 2020-03-16 MED ORDER — THIAMINE HCL 100 MG/ML IJ SOLN: 100.0000 mg | Freq: Every day | INTRAMUSCULAR | Status: DC

## 2020-03-16 MED ORDER — LORAZEPAM 2 MG/ML IJ SOLN: 1.0000 mg | INTRAMUSCULAR | Status: AC | PRN

## 2020-03-16 MED ORDER — TRAZODONE HCL 50 MG PO TABS
50.0000 mg | ORAL_TABLET | Freq: Every day | ORAL | Status: DC
  Administered 2020-03-16 – 2020-03-19 (×4): 50 mg via ORAL
  Filled 2020-03-16 (×4): qty 1

## 2020-03-16 MED ORDER — ATORVASTATIN CALCIUM 10 MG PO TABS
20.0000 mg | ORAL_TABLET | Freq: Every day | ORAL | Status: DC
  Administered 2020-03-16 – 2020-03-20 (×5): 20 mg via ORAL
  Filled 2020-03-16 (×5): qty 2

## 2020-03-16 MED ORDER — AMIODARONE LOAD VIA INFUSION
150.0000 mg | Freq: Once | INTRAVENOUS | Status: AC
  Administered 2020-03-16: 150 mg via INTRAVENOUS
  Filled 2020-03-16: qty 83.34

## 2020-03-16 MED ORDER — PANTOPRAZOLE SODIUM 40 MG PO TBEC
40.0000 mg | DELAYED_RELEASE_TABLET | Freq: Every day | ORAL | Status: DC
  Administered 2020-03-16 – 2020-03-20 (×5): 40 mg via ORAL
  Filled 2020-03-16 (×5): qty 1

## 2020-03-16 MED ORDER — SODIUM CHLORIDE 0.9 % IV SOLN
1.0000 g | INTRAVENOUS | Status: DC
  Administered 2020-03-16: 1 g via INTRAVENOUS
  Filled 2020-03-16: qty 10

## 2020-03-16 MED ORDER — AMIODARONE IV BOLUS ONLY 150 MG/100ML
150.0000 mg | Freq: Once | INTRAVENOUS | Status: AC
  Administered 2020-03-16: 150 mg via INTRAVENOUS
  Filled 2020-03-16: qty 100

## 2020-03-16 MED ORDER — ESCITALOPRAM OXALATE 10 MG PO TABS
10.0000 mg | ORAL_TABLET | Freq: Every day | ORAL | Status: DC
  Administered 2020-03-16 – 2020-03-20 (×5): 10 mg via ORAL
  Filled 2020-03-16 (×5): qty 1

## 2020-03-16 MED ORDER — GERHARDT'S BUTT CREAM
TOPICAL_CREAM | Freq: Four times a day (QID) | CUTANEOUS | Status: DC
  Administered 2020-03-17 (×2): 1 via TOPICAL
  Filled 2020-03-16: qty 1

## 2020-03-16 MED ORDER — ACETAMINOPHEN 325 MG PO TABS: 650.0000 mg | ORAL_TABLET | ORAL | Status: DC | PRN

## 2020-03-16 NOTE — ED Triage Notes (Signed)
Pt BIBA from home. Pt fell at home at 2230 that was a mechanical ground level fall. Pt did not hit his head. On Eliquis. Denies LOC. Pt felt fine until 5 hours ago when he started having sacral and lower back pain. Abrasion/brusing assessed to lower back.

## 2020-03-16 NOTE — H&P (Signed)
History and Physical    PHEONIX CLINKSCALE DQQ:[id number] DOB: April 08, 1941 DOA: 03/16/2020  PCP: [name], MD Consultants:  Carlean Purl - GI; [facility] - cardiology Patient coming from:  Home - lives alone, sister and brother-in-law live nearby; [place]: Sister, [phone]  Chief Complaint:  Fall  HPI: Darryl Diaz is a 79 y.o. male with medical history significant of afib on AC; OSA not on CPAP; CAD s/p cardiac arrest; HTN; HLD; and ETOH dependence in remission presenting with a fall.  He was last admitted from 10/16-29 by the cardiology service for syncope.  He has had unsuccessful DCCV (01/2020) and was most recently started on flecainide - although during last admission he acknowledged not starting it.  He had successful DCCV on 10/20 but reverted to afib and so was started on amiodarone.  He was discharged to CIR from 10/29-11/3.  He was discharged from rehab and was doing ok.  He had some friends/family who came and stayed until yesterday.   Yesterday, he went to sit in the chair and he missed the seat and ended up sitting on the arm of the chair, causing him to fall.  He pressed his Life Alert and EMS came and helped him get up.  They helped him back into his chair and so he didn't come in.  Through the day, he developed severe lower back pain, 11 out of 10.  Occasional R leg radiculopathy since the fall.  The pain was so bad that he activated his Life Alert again.  He cannot tell when he is in RVR.  He had an episode of diarrhea in the ER and has a groin rash.  He thinks he ate old food.  He reports having had 5 episodes of loose stools since last night.  No blood in the stool.  He feels ok between the episodes of diarrhea.  No n/v.    ED Course:  Carryover, per Dr. Hal Hope:  79 year old male with history of A. fib presents to the ER after a fall is found to be in A. fib with RVR after discussing with cardiologist patient was started on amiodarone drip and cardiology will be seeing  patient in consult.  Review of Systems: As per HPI; otherwise review of systems reviewed and negative.   Ambulatory Status:  Ambulates with a walker "until I can ditch it"  COVID Vaccine Status:   Complete plus booster this past weekend  Past Medical History:  Diagnosis Date  . Alcoholism ([place])    in remission following wife's death  . Anemia   . Bradycardia    low 20's  . Cardiac arrest ([place]) 04/20/2019   12 min CPR with epi  . Fall at home 03/28/2016  . Fatty liver   . GERD (gastroesophageal reflux disease)   . H/O seasonal allergies   . History of GI bleed   . Hx of adenomatous colonic polyps 08/10/2017  . Hyperlipidemia   . Hypertension   . Insomnia   . 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 MI (myocardial infarction) ([place]) 04/2019   had CPR by EMS  . OA (osteoarthritis)    hands  . OSA (obstructive sleep apnea)    No longer uses CPAP  . Persistent atrial fibrillation with rapid ventricular response ([place]) 02/05/2020    Past Surgical History:  Procedure Laterality Date  . ANKLE SURGERY Left 2014   had rods put in   . BIOPSY  02/28/2019   Procedure: BIOPSY;  Surgeon: Milus Banister, MD;  Location: [facility] ENDOSCOPY;  Service: Endoscopy;;  . CARDIOVERSION  02/06/2020  . CARDIOVERSION N/A 02/06/2020   Procedure: CARDIOVERSION;  Surgeon: Werner Lean, MD;  Location: [facility] ENDOSCOPY;  Service: Cardiovascular;  Laterality: N/A;  . CARDIOVERSION N/A 02/24/2020   Procedure: CARDIOVERSION;  Surgeon: Werner Lean, MD;  Location: [place] ENDOSCOPY;  Service: Cardiovascular;  Laterality: N/A;  . [place]  . ESOPHAGOGASTRODUODENOSCOPY (EGD) WITH PROPOFOL N/A 02/28/2019   Procedure: ESOPHAGOGASTRODUODENOSCOPY (EGD) WITH PROPOFOL;  Surgeon: Milus Banister, MD;  Location: [facility] ENDOSCOPY;  Service: Endoscopy;  Laterality: N/A;  . HIP ARTHROPLASTY Left 04/01/2016   Procedure: ARTHROPLASTY BIPOLAR HIP (HEMIARTHROPLASTY);  Surgeon: Paralee Cancel, MD;  Location: [place];  Service: Orthopedics;  Laterality: Left;  . HIP CLOSED REDUCTION Left 04/30/2016   Procedure: CLOSED REDUCTION HIP;  Surgeon: Wylene Simmer, MD;  Location: [place];  Service: Orthopedics;  Laterality: Left;  . TONSILLECTOMY    . TOTAL HIP ARTHROPLASTY Right 10/15/2019   Procedure: TOTAL HIP ARTHROPLASTY ANTERIOR APPROACH;  Surgeon: Paralee Cancel, MD;  Location: [facility] ORS;  Service: Orthopedics;  Laterality: Right;  70 mins    Social History   Socioeconomic History  . Marital status: Widowed    Spouse name: Not on file  . Number of children: 2  . Years of education: college  . Highest education level: Not on file  Occupational History  . Occupation: retired  Tobacco Use  . Smoking status: Former Smoker    Types: Cigarettes  . Smokeless tobacco: Never Used  . Tobacco comment:    quit 20 years  smoked on and off for 20 years  Vaping Use  . Vaping Use: Never used  Substance and Sexual Activity  . Alcohol use: No    Comment: 03/2016   i HAD ONE DRINK & THAT WAS THE FIRST DRINK i HAD IN A LONG TIME   . Drug use: No  . Sexual activity: Not Currently  Other Topics Concern  . Not on file  Social History Narrative   The patient is divorced w/ 1 son 1 daughter   He is a retired Regulatory affairs officer, he lived in [place] but [employer] including windows on the world in the [place] room in [place]   He moved to [place] to be near his sister who lives in [place] alcoholic member of AA   2 caffeinated beverages daily prior smoker no drugs   Social Determinants of Radio broadcast assistant Strain:   . Difficulty of Paying Living Expenses: Not on file  Food Insecurity:   . Worried About Charity fundraiser in the Last Year: Not on file  . Ran Out of Food in the Last Year: Not on file  Transportation Needs:   . Lack of Transportation (Medical): Not on file  . Lack of Transportation (Non-Medical): Not on file  Physical  Activity:   . Days of Exercise per Week: Not on file  . Minutes of Exercise per Session: Not on file  Stress:   . Feeling of Stress : Not on file  Social Connections:   . Frequency of Communication with Friends and Family: Not on file  . Frequency of Social Gatherings with Friends and Family: Not on file  . Attends Religious Services: Not on file  . Active Member of Clubs or Organizations: Not on file  . Attends Archivist Meetings: Not on file  .  Marital Status: Not on file  Intimate Partner Violence:   . Fear of Current or Ex-Partner: Not on file  . Emotionally Abused: Not on file  . Physically Abused: Not on file  . Sexually Abused: Not on file    Allergies  Allergen Reactions  . Penicillins Other (See Comments)    Tolerated Cephalosporin Date: 10/15/19. Allergy is from childhood Did it involve swelling of the face/tongue/throat, SOB, or low BP? Unknown Did it involve sudden or severe rash/hives, skin peeling, or any reaction on the inside of your mouth or nose? Unknown Did you need to seek medical attention at a hospital or doctor's office? Unknown When did it last happen?childhood reaction If all above answers are "NO", may proceed with cephalosporin use.    Family History  Problem Relation Age of Onset  . Heart disease Mother 5  . Hypertension Mother   . Arthritis Mother   . Heart failure Father 58  . Stroke Maternal Aunt   . Heart failure Sister   . Colon cancer Neg Hx   . Esophageal cancer Neg Hx   . Stomach cancer Neg Hx   . Rectal cancer Neg Hx     Prior to Admission medications   Medication Sig Start Date End Date Taking? Authorizing Provider  acetaminophen (TYLENOL) 325 MG tablet Take 2 tablets (650 mg total) by mouth every 4 (four) hours as needed for headache or mild pain. 03/10/20  Yes Angiulli, Lavon Paganini, PA-C  amiodarone (PACERONE) 200 MG tablet Take 1 tablet (200 mg total) by mouth daily. 03/13/20  Yes Angiulli, Lavon Paganini, PA-C  apixaban  (ELIQUIS) 5 MG TABS tablet Take 1 tablet (5 mg total) by mouth 2 (two) times daily. 03/10/20  Yes Angiulli, Lavon Paganini, PA-C  atorvastatin (LIPITOR) 20 MG tablet Take 1 tablet (20 mg total) by mouth daily. 03/10/20  Yes Angiulli, Lavon Paganini, PA-C  escitalopram (LEXAPRO) 10 MG tablet Take 1 tablet (10 mg total) by mouth daily. 03/10/20  Yes Angiulli, Lavon Paganini, PA-C  ferrous sulfate 325 (65 FE) MG tablet Take 1 tablet (325 mg total) by mouth 2 (two) times daily with a meal. 03/10/20 04/09/20 Yes Angiulli, Lavon Paganini, PA-C  folic acid (FOLVITE) 1 MG tablet Take 1 tablet (1 mg total) by mouth daily. 03/10/20  Yes Angiulli, Lavon Paganini, PA-C  furosemide (LASIX) 40 MG tablet Take 1 tablet (40 mg total) by mouth daily as needed for fluid (weight gain). 03/10/20  Yes Angiulli, Lavon Paganini, PA-C  loratadine (CLARITIN) 10 MG tablet Take 1 tablet (10 mg total) by mouth daily. 02/12/20  Yes Medina-Vargas, Monina C, NP  Multiple Vitamins-Minerals (CENTRUM SILVER 50+MEN) TABS Take 1 tablet by mouth daily with breakfast.   Yes [provider]  nitroGLYCERIN (NITROSTAT) 0.4 MG SL tablet Place 1 tablet (0.4 mg total) under the tongue every 5 (five) minutes x 3 doses as needed for chest pain. 03/10/20  Yes Angiulli, Lavon Paganini, PA-C  pantoprazole (PROTONIX) 40 MG tablet Take 1 tablet (40 mg total) by mouth daily. 03/10/20  Yes Angiulli, Lavon Paganini, PA-C  potassium chloride SA (KLOR-CON) 20 MEQ tablet Take 40 mEq by mouth daily.   Yes [provider]  tamsulosin (FLOMAX) 0.4 MG CAPS capsule Take 1 capsule (0.4 mg total) by mouth every evening. 03/10/20  Yes Angiulli, Lavon Paganini, PA-C  traZODone (DESYREL) 50 MG tablet Take 1 tablet (50 mg total) by mouth at bedtime. 03/10/20  Yes Angiulli, Lavon Paganini, PA-C  amiodarone (PACERONE) 400 MG tablet Take  1 tablet (400 mg total) by mouth 2 (two) times daily. Patient not taking: Reported on 03/16/2020 03/10/20   Cathlyn Parsons, PA-C    Physical Exam: Vitals:   03/16/20 0700 03/16/20 0715  03/16/20 0745 03/16/20 0800  BP: (!) 145/67 (!) 151/71 (!) 146/66 (!) 148/72  Pulse: 87 92 86 85  Resp: (!) 25 (!) 28 14 (!) 24  Temp:      TempSrc:      SpO2: 99% 99% 98% 98%     . General:  Appears calm and comfortable and is NAD; very conversant . Eyes:  PERRL, EOMI, normal lids, iris . ENT:  grossly normal hearing, lips, mmm; white plaque on tongue c/w thrush . Neck:  no LAD, masses or thyromegaly . Cardiovascular:  RRR (confirmed on telemetry), no m/r/g. 2+ LE edema.  Marland Kitchen Respiratory:   CTA bilaterally with no wheezes/rales/rhonchi.  Normal respiratory effort. . Abdomen:  soft, NT, ND, NABS . Skin:  Marked erythematous groin rash extending onto B upper thighs . Musculoskeletal:  grossly normal tone BUE/BLE, good ROM, no bony abnormality . Psychiatric:  grossly normal mood and affect, speech fluent and appropriate, AOx3 . Neurologic:  CN 2-12 grossly intact, moves all extremities in coordinated fashion    Radiological Exams on Admission: Independently reviewed - see discussion in A/P where applicable  CT Renal Stone Study  Result Date: 03/16/2020 CLINICAL DATA:  [place]. Abrasions to lower back. EXAM: CT ABDOMEN AND PELVIS WITHOUT CONTRAST TECHNIQUE: Multidetector CT imaging of the abdomen and pelvis was performed following the standard protocol without IV contrast. COMPARISON:  08/11/2018 FINDINGS: Lower chest: The lung bases are clear of an acute process. No pleural effusions or pneumothorax. The heart is normal in size. No pericardial effusion. Coronary artery and aortic calcifications are noted. Small hiatal hernia. Remote healed rib fractures are noted. No definite acute fractures. Hepatobiliary: Cirrhotic changes involving the liver appears stable. No obvious hepatic lesions. Small layering gallstones are noted the gallbladder. No CT findings to suggest acute cholecystitis. No common bile duct dilatation. Pancreas: Mild pancreatic atrophy. No mass, inflammation or ductal dilatation.  Spleen: Normal size. No focal lesions. Adrenals/Urinary Tract: The adrenal glands are normal. No renal, ureteral or bladder calculi are identified. No worrisome renal lesions. Small exophytic hyperdense lesion projecting off the lower pole region of the left kidney is stable and likely a benign hemorrhagic cyst. No obvious bladder mass. Visualization of bladder is limited due to artifact from bilateral hip prostheses. Stomach/Bowel: The stomach, duodenum, small bowel and colon are grossly normal without oral contrast. No acute inflammatory changes, mass lesions or obstructive findings. There is fluid noted in the colon which can be seen with diarrhea. The terminal ileum and appendix appear normal. Vascular/Lymphatic: Aortic calcifications but no aneurysm. No mesenteric or retroperitoneal mass, adenopathy or hematoma. Reproductive: The prostate gland and seminal vesicles are grossly normal but limited visualization. Other: No free pelvic fluid collections or pelvic hematoma. No free air or free fluid is identified. Musculoskeletal: Advanced degenerative changes in the lumbar spine but no acute fracture is identified. The bony pelvis is intact. No pelvic fractures. The pubic symphysis and SI joints are intact. There are bilateral hip prostheses with significant artifact but no obvious complicating features are identified. IMPRESSION: 1. No acute abdominal/pelvic findings, mass lesions or adenopathy. 2. Stable cirrhotic changes involving the liver. 3. Cholelithiasis. 4. No acute bony injury is identified. 5. Fluid in the colon can be seen with diarrhea. 6. Aortic atherosclerosis. Aortic Atherosclerosis (ICD10-I70.0). Electronically  Signed   By: Marijo Sanes M.D.   On: 03/16/2020 05:09    EKG: Independently reviewed.  Afib with rate 92; prolonged QTc 561; no evidence of acute ischemia   Labs on Admission: I have personally reviewed the available labs and imaging studies at the time of the admission.  Pertinent  labs:   Na++ 126 Glucose 117 Albumin 3.3 WBC 11.5 Hgb 11.2 INR 1.3   Assessment/Plan Principal Problem:   Fall at home, initial encounter Active Problems:   Hypertension   Hyperlipidemia   Chronic diastolic congestive heart failure (HCC)   Atrial fibrillation with RVR ([place])   Yeast infection   Diarrhea   Fall -Patient was just released from CIR for debility last week -He returned home independently but had visiting friends/family staying with him until yesterday AM -After they left, he fell and was unable to get up -EMS helped him up and he thought he was ok, but he called them back later due to back pain -He appears calm and comfortable now but has demonstrated that he is unsafe living independently -He reports that his son can come from [place] to stay with him and his sister can also help, but these are transient solutions  -He has an application pending at [facility] -Will request TOC assistance for ALF placement -PT/OT consults pending -Continue Lexapro and Trazodone  Afib with RVR -He has had repeated issues with this problem and has been cardioverted a couple of times -He was once again in RVR in the ER -He was started on an Amiodarone drip after consultation with cardiology -Will continue Amiodarone for now, although he has since converted back to NSR -Cardiology to see and provide additional management strategies -Continue Eliquis  Diarrhea -He reports eating bad food after his family left  -He has since had 5 episodes of diarrhea including one this AM in the ER -Appears less likely related to C diff and more likely foodborne illness -Will check stool studies; if negative and diarrhea persists, consider also checking C diff -Will add a probiotic  Yeast infection -Patient with oral thrush and an impressive diaper dermatitis -He reports using Nystatin powder at home for this issue -Given the Amiodarone, systemic antifungal therapy is contraindicated -Will  instead use Gerhardt's butt cream and oral Nystatin -Will also add a probiotic  HTN -He does not appear to be taking medications for this issue at this time  HLD -Continue Lipitor  Chronic diastolic CHF -Preserved EF on 10/17 echo -Has LE edema but no other evidence of decompensation at this time -Will hold Lasix and follow    Note: This patient has been tested and is pending for the novel coronavirus COVID-19. The patient has been fully vaccinated against COVID-19.    DVT prophylaxis: Eliquis Code Status:  Full - confirmed with patient Family Communication: None present Disposition Plan:  The patient is from: home  Anticipated d/c is to: be determined - needs placement in ALF if possible  Anticipated d/c date will depend on clinical response to treatment, but possibly as early as tomorrow if he has excellent response to treatment  Patient is currently: acutely ill Consults called: Cardiology; PT/OT/TOC team Admission status:  It is my clinical opinion that referral for OBSERVATION is reasonable and necessary in this patient based on the above information provided. The aforementioned taken together are felt to place the patient at high risk for further clinical deterioration. However it is anticipated that the patient may be medically stable for discharge from  the hospital within 24 to 48 hours.    Karmen Bongo MD [facility]   How to contact the [facility] Attending or Consulting provider [place] or covering provider during after hours [place], for this patient?  1. Check the care team in [facility] and look for a) attending/consulting [facility] provider listed and b) the [facility] team listed 2. Log into [email or web address] and use [place] universal password to access. If you do not have the password, please contact the hospital operator. 3. Locate the [facility] provider you are looking for under [facility] and page to a number that you can be directly reached. 4. If you still have  difficulty reaching the provider, please page the [facility] (Director on Call) for the Hospitalists listed on amion for assistance.   03/16/2020, 8:20 AM

## 2020-03-16 NOTE — Progress Notes (Addendum)
Update: patient's stool was + for Salmonella and so he has Salmonella enteritis as the cause of his diarrhea. Will treat with Rocephin for now.  Continue other management as per H&P.  I spoke with his son, Quillian Quince.  One of the reasons that he falls regularly and doesn't want to go into ALF is his drinking.  He has a h/o heavy use and his son thinks the drinking has gotten worse.  Family doesn't live with him and so cannot quantify this.  Family is frustrated because of his cognitively intact status and the fact that he continues to get better and chooses to go home.  They would appreciate assistance in encouraging the patient to go into ALF.  Will also add CIWA.   Carlyon Shadow, M.D.

## 2020-03-16 NOTE — ED Provider Notes (Signed)
[facility] EMERGENCY DEPARTMENT Provider Note   CSN: [id number] Arrival date & time: 03/16/20  0998     History Chief Complaint  Patient presents with  . Fall    Darryl Diaz is a 79 y.o. male.  80 year old male recently discharged from here and rehabilitation.  He states that he was going to sit on chair misjudged it and ended up sitting on the armrest and thus losing his balance falling.  Patient started having some worsening pain throughout the night so presented here for further evaluation.  Patient states he was in his lower back.  Recently started blood thinners secondary to atrial fibrillation.  Has noted bruising by EMS and nursing.  Did not hit his head.  No syncope.  No other complaints.   Fall   Past Medical History:  Diagnosis Date  . 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 Anemia   . Bradycardia    low 20's  . Cardiac arrest ([place]) 04/20/2019   12 min CPR with epi  . Dysrhythmia   . Fall at home 03/28/2016  . Fatty liver   . GERD (gastroesophageal reflux disease)   . H/O seasonal allergies   . History of GI bleed   . Hx of adenomatous colonic polyps 08/10/2017  . Hyperlipidemia   . Hypertension   . Insomnia   . 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 MI (myocardial infarction) ([place]) 04/2019   had CPR by EMS  . OA (osteoarthritis)    hands  . OSA (obstructive sleep apnea)    No longer uses CPAP  . Persistent atrial fibrillation with rapid ventricular response ([place]) 02/05/2020    Patient Active Problem List   Diagnosis Date Noted  . 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 ([place])   . Allergies   . Anemia, chronic disease   . Chronic diastolic congestive heart failure ([place])   . History of hypertension   . Atrial fibrillation with RVR ([place])   . Debility   . Syncope and collapse 02/22/2020  . AKI (acute kidney injury) ([place]) 02/10/2020  . Unspecified protein-calorie malnutrition ([place])  02/10/2020  . Weakness 02/06/2020  . Persistent atrial fibrillation with rapid ventricular response ([place]) 02/05/2020  . 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 Bradycardia 04/20/2019  . Atrial fibrillation with rapid ventricular response ([place])   . GI bleed 02/25/2019  . Fall 02/25/2019  . Bilateral leg edema 02/25/2019  . Hyponatremia 02/25/2019  . Acute posthemorrhagic anemia 08/11/2018  . Normochromic normocytic anemia 08/11/2018  . Hx of adenomatous colonic polyps 08/10/2017  . PAF (paroxysmal atrial fibrillation) ([place]) 02/05/2017  . Dislocation of hip, posterior, left, closed ([place]) 04/30/2016  . Hip fracture ([place]) 03/30/2016  . Hypertension 03/30/2016  . Hyperlipidemia 03/30/2016  . GERD (gastroesophageal reflux disease) 03/30/2016  . Osteoarthritis 03/30/2016  . Depression 03/30/2016  . Rhabdomyolysis 03/30/2016  . Leukocytosis 03/30/2016  . Pressure injury of skin 03/30/2016  . Closed fracture of left hip [facility])     Past Surgical History:  Procedure Laterality Date  . ANKLE SURGERY Left 2014   had rods put in   . BIOPSY  02/28/2019   Procedure: BIOPSY;  Surgeon: Milus Banister, MD;  Location[facility] END[facility]COPY;  Service: Endoscopy;;  . CARDIOVERSION  02/06/2020  . CARDIOVERSION N/A 02/06/2020   Procedure: CARDIOVERSION;  Surgeon: Werner Lean, MD;  Location: [facility];  Service: Cardiovascular;  Laterality:  N/A;  . CARDIOVERSION N/A 02/24/2020   Procedure: CARDIOVERSION;  Surgeon: Werner Lean, MD;  Location: [facility] ENDOSCOPY;  Service: Cardiovascular;  Laterality: N/A;  . [place]  . ESOPHAGOGASTRODUODENOSCOPY (EGD) WITH PROPOFOL N/A 02/28/2019   Procedure: ESOPHAGOGASTRODUODENOSCOPY (EGD) WITH PROPOFOL;  Surgeon: Milus Banister, MD;  Location: [facility] ENDOSCOPY;  Service: Endoscopy;  Laterality: N/A;  . HIP ARTHROPLASTY Left 04/01/2016   Procedure: ARTHROPLASTY BIPOLAR HIP (HEMIARTHROPLASTY);   Surgeon: Paralee Cancel, MD;  Location: [facility];  Service: Orthopedics;  Laterality: Left;  . HIP CLOSED REDUCTION Left 04/30/2016   Procedure: CLOSED REDUCTION HIP;  Surgeon: Wylene Simmer, MD;  Location: [facility];  Service: Orthopedics;  Laterality: Left;  . TONSILLECTOMY    . TOTAL HIP ARTHROPLASTY Right 10/15/2019   Procedure: TOTAL HIP ARTHROPLASTY ANTERIOR APPROACH;  Surgeon: Paralee Cancel, MD;  Location: [facility] OR[facility];  Service: Orthopedics;  Laterality: Right;  70 mins       Family History  Problem Relation Age of Onset  . Heart disease Mother 49  . Hypertension Mother   . Arthritis Mother   . Heart failure Father 15  . Stroke Maternal Aunt   . Heart failure Sister   . Colon cancer Neg Hx   . Esophageal cancer Neg Hx   . Stomach cancer Neg Hx   . Rectal cancer Neg Hx     Social History   Tobacco Use  . Smoking status: Former Smoker    Types: Cigarettes  . Smokeless tobacco: Never Used  . Tobacco comment:    quit 20 years  smoked on and off for 20 years  Vaping Use  . Vaping Use: Never used  Substance Use Topics  . Alcohol use: No    Comment: 03/2016   i HAD ONE DRINK & THAT WAS THE FIRST DRINK i HAD IN A LONG TIME   . Drug use: No    Home Medications Prior to Admission medications   Medication Sig Start Date End Date Taking? Authorizing Provider  acetaminophen (TYLENOL) 325 MG tablet Take 2 tablets (650 mg total) by mouth every 4 (four) hours as needed for headache or mild pain. 03/10/20  Yes Angiulli, Lavon Paganini, PA-C  amiodarone (PACERONE) 200 MG tablet Take 1 tablet (200 mg total) by mouth daily. 03/13/20  Yes Angiulli, Lavon Paganini, PA-C  apixaban (ELIQUIS) 5 MG TABS tablet Take 1 tablet (5 mg total) by mouth 2 (two) times daily. 03/10/20  Yes Angiulli, Lavon Paganini, PA-C  atorvastatin (LIPITOR) 20 MG tablet Take 1 tablet (20 mg total) by mouth daily. 03/10/20  Yes Angiulli, Lavon Paganini, PA-C  escitalopram (LEXAPRO) 10 MG tablet Take 1 tablet (10 mg total) by mouth daily. 03/10/20  Yes  Angiulli, Lavon Paganini, PA-C  ferrous sulfate 325 (65 FE) MG tablet Take 1 tablet (325 mg total) by mouth 2 (two) times daily with a meal. 03/10/20 04/09/20 Yes Angiulli, Lavon Paganini, PA-C  folic acid (FOLVITE) 1 MG tablet Take 1 tablet (1 mg total) by mouth daily. 03/10/20  Yes Angiulli, Lavon Paganini, PA-C  furosemide (LASIX) 40 MG tablet Take 1 tablet (40 mg total) by mouth daily as needed for fluid (weight gain). 03/10/20  Yes Angiulli, Lavon Paganini, PA-C  loratadine (CLARITIN) 10 MG tablet Take 1 tablet (10 mg total) by mouth daily. 02/12/20  Yes Medina-Vargas, Monina C, NP  Multiple Vitamins-Minerals (CENTRUM SILVER 50+MEN) TABS Take 1 tablet by mouth daily with breakfast.   Yes [provider]  nitroGLYCERIN (NITROSTAT) 0.4  MG SL tablet Place 1 tablet (0.4 mg total) under the tongue every 5 (five) minutes x 3 doses as needed for chest pain. 03/10/20  Yes Angiulli, Lavon Paganini, PA-C  pantoprazole (PROTONIX) 40 MG tablet Take 1 tablet (40 mg total) by mouth daily. 03/10/20  Yes Angiulli, Lavon Paganini, PA-C  potassium chloride SA (KLOR-CON) 20 MEQ tablet Take 40 mEq by mouth daily.   Yes [provider]  tamsulosin (FLOMAX) 0.4 MG CAPS capsule Take 1 capsule (0.4 mg total) by mouth every evening. 03/10/20  Yes Angiulli, Lavon Paganini, PA-C  traZODone (DESYREL) 50 MG tablet Take 1 tablet (50 mg total) by mouth at bedtime. 03/10/20  Yes Angiulli, Lavon Paganini, PA-C  amiodarone (PACERONE) 400 MG tablet Take 1 tablet (400 mg total) by mouth 2 (two) times daily. Patient not taking: Reported on 03/16/2020 03/10/20   Angiulli, Lavon Paganini, PA-C    Allergies    Penicillins  Review of Systems   Review of Systems  All other systems reviewed and are negative.   Physical Exam Updated Vital Signs BP 135/70   Pulse 83   Temp 98 F (36.7 C) (Oral)   Resp 18   SpO2 91%   Physical Exam Vitals and nursing note reviewed.  Constitutional:      Appearance: He is well-developed.  HENT:     Head: Normocephalic and atraumatic.      Nose: No congestion or rhinorrhea.     Mouth/Throat:     Mouth: Mucous membranes are moist.     Pharynx: Oropharynx is clear.  Eyes:     Pupils: Pupils are equal, round, and reactive to light.  Cardiovascular:     Rate and Rhythm: Normal rate.  Pulmonary:     Effort: Pulmonary effort is normal. No respiratory distress.  Abdominal:     General: Abdomen is flat. There is no distension.  Musculoskeletal:        General: Tenderness (right flank, right hip) present. Normal range of motion.     Cervical back: Normal range of motion.  Skin:    General: Skin is warm and dry.     Findings: Erythema (right flank) present.  Neurological:     General: No focal deficit present.     Mental Status: He is alert.     ED Results / Procedures / Treatments   Labs (all labs ordered are listed, but only abnormal results are displayed) Labs Reviewed  CBC WITH DIFFERENTIAL/PLATELET - Abnormal; Notable for the following components:      Result Value   WBC 11.5 (*)    RBC 3.57 (*)    Hemoglobin 11.2 (*)    HCT 33.8 (*)    Neutro Abs 9.9 (*)    Lymphs Abs 0.5 (*)    All other components within normal limits  COMPREHENSIVE METABOLIC PANEL - Abnormal; Notable for the following components:   Sodium 126 (*)    Chloride 91 (*)    Glucose, Bld 117 (*)    Calcium 8.5 (*)    Total Protein 6.1 (*)    Albumin 3.3 (*)    All other components within normal limits  PROTIME-INR - Abnormal; Notable for the following components:   Prothrombin Time 15.8 (*)    INR 1.3 (*)    All other components within normal limits  RESPIRATORY PANEL BY RT PCR (FLU A&B, COVID)  TYPE AND SCREEN    EKG EKG Interpretation  Date/Time:  Monday March 16 2020 03:40:05 EST Ventricular Rate:  92  PR Interval:    QRS Duration: 106 QT Interval:  453 QTC Calculation: 561 R Axis:   -24 Text Interpretation: Sinus rhythm Short PR interval LAE, consider biatrial enlargement Borderline left axis deviation Abnormal T,  consider ischemia, anterior leads Prolonged QT interval still afib Confirmed by Merrily Pew [phone]) on 03/16/2020 5:54:41 AM   Radiology CT Renal Stone Study  Result Date: 03/16/2020 CLINICAL DATA:  [place]. Abrasions to lower back. EXAM: CT ABDOMEN AND PELVIS WITHOUT CONTRAST TECHNIQUE: Multidetector CT imaging of the abdomen and pelvis was performed following the standard protocol without IV contrast. COMPARISON:  08/11/2018 FINDINGS: Lower chest: The lung bases are clear of an acute process. No pleural effusions or pneumothorax. The heart is normal in size. No pericardial effusion. Coronary artery and aortic calcifications are noted. Small hiatal hernia. Remote healed rib fractures are noted. No definite acute fractures. Hepatobiliary: Cirrhotic changes involving the liver appears stable. No obvious hepatic lesions. Small layering gallstones are noted the gallbladder. No CT findings to suggest acute cholecystitis. No common bile duct dilatation. Pancreas: Mild pancreatic atrophy. No mass, inflammation or ductal dilatation. Spleen: Normal size. No focal lesions. Adrenals/Urinary Tract: The adrenal glands are normal. No renal, ureteral or bladder calculi are identified. No worrisome renal lesions. Small exophytic hyperdense lesion projecting off the lower pole region of the left kidney is stable and likely a benign hemorrhagic cyst. No obvious bladder mass. Visualization of bladder is limited due to artifact from bilateral hip prostheses. Stomach/Bowel: The stomach, duodenum, small bowel and colon are grossly normal without oral contrast. No acute inflammatory changes, mass lesions or obstructive findings. There is fluid noted in the colon which can be seen with diarrhea. The terminal ileum and appendix appear normal. Vascular/Lymphatic: Aortic calcifications but no aneurysm. No mesenteric or retroperitoneal mass, adenopathy or hematoma. Reproductive: The prostate gland and seminal vesicles are grossly normal  but limited visualization. Other: No free pelvic fluid collections or pelvic hematoma. No free air or free fluid is identified. Musculoskeletal: Advanced degenerative changes in the lumbar spine but no acute fracture is identified. The bony pelvis is intact. No pelvic fractures. The pubic symphysis and SI joints are intact. There are bilateral hip prostheses with significant artifact but no obvious complicating features are identified. IMPRESSION: 1. No acute abdominal/pelvic findings, mass lesions or adenopathy. 2. Stable cirrhotic changes involving the liver. 3. Cholelithiasis. 4. No acute bony injury is identified. 5. Fluid in the colon can be seen with diarrhea. 6. Aortic atherosclerosis. Aortic Atherosclerosis (ICD10-I70.0). Electronically Signed   By: Marijo Sanes M.D.   On: 03/16/2020 05:09    Procedures .Critical Care Performed by: Merrily Pew, MD Authorized by: Merrily Pew, MD   Critical care provider statement:    Critical care time (minutes):  45   Critical care was necessary to treat or prevent imminent or life-threatening deterioration of the following conditions:  Circulatory failure and cardiac failure   Critical care was time spent personally by me on the following activities:  Discussions with consultants, evaluation of patient's response to treatment, examination of patient, ordering and performing treatments and interventions, ordering and review of laboratory studies, ordering and review of radiographic studies, pulse oximetry, re-evaluation of patient's condition, obtaining history from patient or surrogate and review of old charts   (including critical care time)  Medications Ordered in ED Medications  amiodarone (NEXTERONE) 1.8 mg/mL load via infusion 150 mg (150 mg Intravenous Bolus from Bag 03/16/20 0629)    Followed by  amiodarone (NEXTERONE PREMIX) 360-4.14 MG/200ML-% (  1.8 mg/mL) IV infusion (60 mg/hr Intravenous New Bag/Given 03/16/20 0631)    Followed by    amiodarone (NEXTERONE PREMIX) 360-4.14 MG/200ML-% (1.8 mg/mL) IV infusion (has no administration in time range)  amiodarone (NEXTERONE) IV bolus only 150 mg/100 mL (0 mg Intravenous Stopped 03/16/20 0555)    ED Course  I have reviewed the triage vital signs and the nursing notes.  Pertinent labs & imaging results that were available during my care of the patient were reviewed by me and considered in my medical decision making (see chart for details).    MDM Rules/Calculators/A&P                         eval for fracture vs retroperitoneal hematoma.   Having episodic fast HR with some pvc's. Doubt vtach, appears to be afib rvr for which he has a history. Will give dose of amiodarone as that appears what he is on at home.   Amiodarone did not convert him. Will give another bolus, start infusion. Discuss with cardiology: not primary admit but will consult.   D/w medicine, will admit.   Final Clinical Impression(s) / ED Diagnoses Final diagnoses:  Atrial fibrillation with rapid ventricular response [facility])    Rx / DC Orders ED Discharge Orders    None       Eivan Gallina, Corene Cornea, MD 03/16/20 [phone]

## 2020-03-16 NOTE — ED Notes (Signed)
Pt assisted to the bedside commode. Large bruise assessed to right flank

## 2020-03-16 NOTE — Consult Note (Addendum)
Cardiology Consultation:   Patient ID: Darryl Diaz; [id number]; [birth year]   Admit date: 03/16/2020 Date of Consult: 03/16/2020  Primary Care Provider: [name], MD Primary Cardiologist: Sherren Mocha, MD  Primary Electrophysiologist: Darryl Axe, MD   Patient Profile:   Darryl Diaz is a 79 y.o. male with a hx of persistent atrial fibrillation s/p multiple unsuccessful DCCVs, prior brady arrest, HTN, HLD, GERD and OSA who is being seen today for the evaluation of AF RVR at the request of Dr. Lorin Diaz.   History of Present Illness:   [name] is a 79yo M with a hx as stated above who presented to [facility] after sustaining a mechanical fall. Pt reports that he was attempting to sit in his chair when he accidentally sat on the arm, lost his balance and landed on the floor. He wears a Life Alert system and therefore pressed and called for assistance. On EMS arrival, he was found to be in AF with RVR. He states that he never knows when he is in AF. He reports no prodromal symptoms such as dizziness, chest pain or SOB. He also reports ongoing diarrhea since being back at home however feel that this is from eating spoiled food.   In reference to AF, he was placed on Amiodarone infusion with conversion to NSR. With ambulation, he was noted to be in NSR with ventricular bigeminy, capture by bedside EKG per RN. He denies chest pain, orthopnea, SOB, dizziness, LE edema, or syncope. He reports that he did not hit his head with the fall. He has been compliant with his anticoagulation as well as his home Amio.   He was recently admitted 02/05/20 for the evaluation of weakness and fatigue found to have AF with RVR. He was started on IV diltiazem and Eliquis. She underwent DCCV and was initially maintaining NSR. Echo showed normal LVEF. In follow up, 10/12/21he was found to be back in AF with persistent fatigue. Plan at that time was Flecainide 100mg  BID )discusse b/w Dr. Burt Knack and Dr.  Lovena Le) with plans for repeat cardioversion 02/24/20.   He then presented back to [facility] 02/22/20 with syncope while sitting in his car with no prodromal symptoms. EKG on ED arrival showed AF with RVR. The patient had not started Flecainide as recommended. He underwent stress testing while hospitalized which was negative. He then underwent cardioversion as previously planned on 02/23/20 with AF recurrence again on 02/28/20 at which time he was started on Amiodarone. His beta blocker was stopped out of concern that syncope was secondary to bradycardia per Dr. Rayann Heman. Amio plan was for 400mg  BID for an additional week after discharge then transition to 200mg  QD. He was discharged to [facility] and was discharged home 03/11/20 at which time he went home with the help of friends and family.   Past Medical History:  Diagnosis Date  . 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 Anemia   . Bradycardia    low 20's  . Cardiac arrest ([place]) 04/20/2019   12 min CPR with epi  . Fall at home 03/28/2016  . Fatty liver   . GERD (gastroesophageal reflux disease)   . H/O seasonal allergies   . History of GI bleed   . Hx of adenomatous colonic polyps 08/10/2017  . Hyperlipidemia   . Hypertension   . Insomnia   . 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 MI (myocardial infarction) ([place]) 04/2019  had CPR by EMS  . OA (osteoarthritis)    hands  . OSA (obstructive sleep apnea)    No longer uses CPAP  . Persistent atrial fibrillation with rapid ventricular response ([place]) 02/05/2020    Past Surgical History:  Procedure Laterality Date  . ANKLE SURGERY Left 2014   had rods put in   . BIOPSY  02/28/2019   Procedure: BIOPSY;  Surgeon: Milus Banister, MD;  Location: [facility] ENDOSCOPY;  Service: Endoscopy;;  . CARDIOVERSION  02/06/2020  . CARDIOVERSION N/A 02/06/2020   Procedure: CARDIOVERSION;  Surgeon: Werner Lean, MD;  Location: [facility] ENDOSCOPY;  Service: Cardiovascular;   Laterality: N/A;  . CARDIOVERSION N/A 02/24/2020   Procedure: CARDIOVERSION;  Surgeon: Werner Lean, MD;  Location: [facility] ENDOSCOPY;  Service: Cardiovascular;  Laterality: N/A;  . [place]  . ESOPHAGOGASTRODUODENOSCOPY (EGD) WITH PROPOFOL N/A 02/28/2019   Procedure: ESOPHAGOGASTRODUODENOSCOPY (EGD) WITH PROPOFOL;  Surgeon: Milus Banister, MD;  Location: [facility] ENDOSCOPY;  Service: Endoscopy;  Laterality: N/A;  . HIP ARTHROPLASTY Left 04/01/2016   Procedure: ARTHROPLASTY BIPOLAR HIP (HEMIARTHROPLASTY);  Surgeon: Paralee Cancel, MD;  Location: [place];  Service: Orthopedics;  Laterality: Left;  . HIP CLOSED REDUCTION Left 04/30/2016   Procedure: CLOSED REDUCTION HIP;  Surgeon: Wylene Simmer, MD;  Location: [place];  Service: Orthopedics;  Laterality: Left;  . TONSILLECTOMY    . TOTAL HIP ARTHROPLASTY Right 10/15/2019   Procedure: TOTAL HIP ARTHROPLASTY ANTERIOR APPROACH;  Surgeon: Paralee Cancel, MD;  Location: [facility] ORS;  Service: Orthopedics;  Laterality: Right;  70 mins     Prior to Admission medications   Medication Sig Start Date End Date Taking? Authorizing Provider  acetaminophen (TYLENOL) 325 MG tablet Take 2 tablets (650 mg total) by mouth every 4 (four) hours as needed for headache or mild pain. 03/10/20  Yes Angiulli, Lavon Paganini, PA-C  amiodarone (PACERONE) 200 MG tablet Take 1 tablet (200 mg total) by mouth daily. 03/13/20  Yes Angiulli, Lavon Paganini, PA-C  apixaban (ELIQUIS) 5 MG TABS tablet Take 1 tablet (5 mg total) by mouth 2 (two) times daily. 03/10/20  Yes Angiulli, Lavon Paganini, PA-C  atorvastatin (LIPITOR) 20 MG tablet Take 1 tablet (20 mg total) by mouth daily. 03/10/20  Yes Angiulli, Lavon Paganini, PA-C  escitalopram (LEXAPRO) 10 MG tablet Take 1 tablet (10 mg total) by mouth daily. 03/10/20  Yes Angiulli, Lavon Paganini, PA-C  ferrous sulfate 325 (65 FE) MG tablet Take 1 tablet (325 mg total) by mouth 2 (two) times daily with a meal. 03/10/20 04/09/20 Yes Angiulli, Lavon Paganini, PA-C  folic  acid (FOLVITE) 1 MG tablet Take 1 tablet (1 mg total) by mouth daily. 03/10/20  Yes Angiulli, Lavon Paganini, PA-C  furosemide (LASIX) 40 MG tablet Take 1 tablet (40 mg total) by mouth daily as needed for fluid (weight gain). 03/10/20  Yes Angiulli, Lavon Paganini, PA-C  loratadine (CLARITIN) 10 MG tablet Take 1 tablet (10 mg total) by mouth daily. 02/12/20  Yes Medina-Vargas, Monina C, NP  Multiple Vitamins-Minerals (CENTRUM SILVER 50+MEN) TABS Take 1 tablet by mouth daily with breakfast.   Yes [provider]  nitroGLYCERIN (NITROSTAT) 0.4 MG SL tablet Place 1 tablet (0.4 mg total) under the tongue every 5 (five) minutes x 3 doses as needed for chest pain. 03/10/20  Yes Angiulli, Lavon Paganini, PA-C  pantoprazole (PROTONIX) 40 MG tablet Take 1 tablet (40 mg total) by mouth daily. 03/10/20  Yes Angiulli, Lavon Paganini, PA-C  potassium chloride SA (KLOR-CON)  20 MEQ tablet Take 40 mEq by mouth daily.   Yes [provider]  tamsulosin (FLOMAX) 0.4 MG CAPS capsule Take 1 capsule (0.4 mg total) by mouth every evening. 03/10/20  Yes Angiulli, Lavon Paganini, PA-C  traZODone (DESYREL) 50 MG tablet Take 1 tablet (50 mg total) by mouth at bedtime. 03/10/20  Yes Angiulli, Lavon Paganini, PA-C    Inpatient Medications: Scheduled Meds: . apixaban  5 mg Oral BID  . atorvastatin  20 mg Oral Daily  . escitalopram  10 mg Oral Daily  . ferrous sulfate  325 mg Oral BID WC  . folic acid  1 mg Oral Daily  . Gerhardt's butt cream   Topical QID  . loratadine  10 mg Oral Daily  . multivitamin with minerals  1 tablet Oral Q breakfast  . nystatin  5 mL Oral QID  . pantoprazole  40 mg Oral Daily  . saccharomyces boulardii  250 mg Oral BID  . tamsulosin  0.4 mg Oral QPM  . traZODone  50 mg Oral QHS   Continuous Infusions: . amiodarone 60 mg/hr (03/16/20 0631)   Followed by  . amiodarone     PRN Meds: acetaminophen, ondansetron (ZOFRAN) IV  Allergies:    Allergies  Allergen Reactions  . Penicillins Other (See Comments)     Tolerated Cephalosporin Date: 10/15/19. Allergy is from childhood Did it involve swelling of the face/tongue/throat, SOB, or low BP? Unknown Did it involve sudden or severe rash/hives, skin peeling, or any reaction on the inside of your mouth or nose? Unknown Did you need to seek medical attention at a hospital or doctor's office? Unknown When did it last happen?childhood reaction If all above answers are "NO", may proceed with cephalosporin use.    Social History:   Social History   Socioeconomic History  . Marital status: Widowed    Spouse name: Not on file  . Number of children: 2  . Years of education: college  . Highest education level: Not on file  Occupational History  . Occupation: retired  Tobacco Use  . Smoking status: Former Smoker    Types: Cigarettes  . Smokeless tobacco: Never Used  . Tobacco comment:    quit 20 years  smoked on and off for 20 years  Vaping Use  . Vaping Use: Never used  Substance and Sexual Activity  . Alcohol use: No    Comment: 03/2016   i HAD ONE DRINK & THAT WAS THE FIRST DRINK i HAD IN A LONG TIME   . Drug use: No  . Sexual activity: Not Currently  Other Topics Concern  . Not on file  Social History Narrative   The patient is divorced w/ 1 son 1 daughter   He is a retired Regulatory affairs officer, he lived in [place] but [employer] including windows on the world in the [place] room in [place]   He moved to [place] to be near his sister who lives in [place] alcoholic member of AA   2 caffeinated beverages daily prior smoker no drugs   Social Determinants of Radio broadcast assistant Strain:   . Difficulty of Paying Living Expenses: Not on file  Food Insecurity:   . Worried About Charity fundraiser in the Last Year: Not on file  . Ran Out of Food in the Last Year: Not on file  Transportation Needs:   . Lack of Transportation (Medical): Not on file  . Lack of Transportation (  Non-Medical):  Not on file  Physical Activity:   . Days of Exercise per Week: Not on file  . Minutes of Exercise per Session: Not on file  Stress:   . Feeling of Stress : Not on file  Social Connections:   . Frequency of Communication with Friends and Family: Not on file  . Frequency of Social Gatherings with Friends and Family: Not on file  . Attends Religious Services: Not on file  . Active Member of Clubs or Organizations: Not on file  . Attends Archivist Meetings: Not on file  . Marital Status: Not on file  Intimate Partner Violence:   . Fear of Current or Ex-Partner: Not on file  . Emotionally Abused: Not on file  . Physically Abused: Not on file  . Sexually Abused: Not on file    Family History:   Family History  Problem Relation Age of Onset  . Heart disease Mother 78  . Hypertension Mother   . Arthritis Mother   . Heart failure Father 35  . Stroke Maternal Aunt   . Heart failure Sister   . Colon cancer Neg Hx   . Esophageal cancer Neg Hx   . Stomach cancer Neg Hx   . Rectal cancer Neg Hx    Family Status:  Family Status  Relation Name Status  . Mother  Deceased  . Father  Deceased  . Sister  Alive  . Mat Aunt  Deceased  . Sister  Deceased  . MGM  Deceased  . MGF  Deceased  . PGM  Deceased  . PGF  Deceased  . Daughter  Alive  . Son  Alive  . Neg Hx  (Not Specified)    ROS:  Please see the history of present illness.  All other ROS reviewed and negative.     Physical Exam/Data:   Vitals:   03/16/20 0930 03/16/20 1000 03/16/20 1030 03/16/20 1130  BP: (!) 156/69 (!) 151/78 (!) 152/76 138/83  Pulse: 84  78 86  Resp: 19 (!) 24 (!) 22 (!) 22  Temp:      TempSrc:      SpO2: 98%  98% 99%  Weight:      Height:       No intake or output data in the 24 hours ending 03/16/20 1205 Filed Weights   03/16/20 0901  Weight: 102.5 kg   Body mass index is 30.65 kg/m.   General: Elderly,  NAD Neck: Negative for carotid bruits. No JVD Lungs:Clear to  ausculation bilaterally. No wheezes, rales, or rhonchi. Breathing is unlabored. Cardiovascular: RRR with S1 S2. No murmurs Abdomen: Soft, non-tender, non-distended. No obvious abdominal masses. Extremities: No edema. Radial pulses 2+ bilaterally Neuro: Alert and oriented. No focal deficits. No facial asymmetry. MAE spontaneously. Psych: Responds to questions appropriately with normal affect.     EKG:  The EKG was personally reviewed and demonstrates: 03/16/20 NSR with ventricular bigeminy, HR 84bpm  Telemetry:  Telemetry was personally reviewed and demonstrates: 03/16/20 NSR with HR in the 80's   Relevant CV Studies:  Stress test 02/23/2020  There was no ST segment deviation noted during stress.  The study is normal.  This is a low risk study.  The left ventricular ejection fraction is normal (55-65%).  Echo 02/23/2020 1. Left ventricular ejection fraction, by estimation, is 60 to 65%. The  left ventricle has normal function. The left ventricle has no regional  wall motion abnormalities. Left ventricular diastolic function could not  be  evaluated.  2. Apical RV hyperkinesis. Right ventricular systolic function is normal.  The right ventricular size is mildly enlarged.  3. Left atrial size was mildly dilated.  4. The mitral valve is grossly normal. Trivial mitral valve  regurgitation.  5. The aortic valve was not well visualized. Aortic valve regurgitation  is not visualized.  6. There is dilatation of the aortic root, measuring 42 mm. There is  dilatation of the ascending aorta, measuring 39 mm.  7. The inferior vena cava is dilated in size with <50% respiratory  variability, suggesting right atrial pressure of 15 mmHg.   Comparison(s): A prior study was performed on 02/06/20. Compared to prior,  McConnell's Sign redemonstrated. Stable Aortic Root and Ascending Aorta  measurements. IVC dilation increased from prior. Less MR demonstrated in  this study.    Laboratory  Data:  Chemistry Recent Labs  Lab 03/16/20 0508  NA 126*  K 3.5  CL 91*  CO2 23  GLUCOSE 117*  BUN 14  CREATININE 1.15  CALCIUM 8.5*  GFRNONAA >60  ANIONGAP 12    Total Protein  Date Value Ref Range Status  03/16/2020 6.1 (L) 6.5 - 8.1 g/dL Final   Albumin  Date Value Ref Range Status  03/16/2020 3.3 (L) 3.5 - 5.0 g/dL Final   AST  Date Value Ref Range Status  03/16/2020 32 15 - 41 U/L Final   ALT  Date Value Ref Range Status  03/16/2020 15 0 - 44 U/L Final   Alkaline Phosphatase  Date Value Ref Range Status  03/16/2020 85 38 - 126 U/L Final   Total Bilirubin  Date Value Ref Range Status  03/16/2020 1.2 0.3 - 1.2 mg/dL Final   Hematology Recent Labs  Lab 03/16/20 0508  WBC 11.5*  RBC 3.57*  HGB 11.2*  HCT 33.8*  MCV 94.7  MCH 31.4  MCHC 33.1  RDW 14.2  PLT 212   Cardiac EnzymesNo results for input(s): TROPONINI in the last 168 hours. No results for input(s): TROPIPOC in the last 168 hours.  BNPNo results for input(s): BNP, PROBNP in the last 168 hours.  DDimer No results for input(s): DDIMER in the last 168 hours. TSH:  Lab Results  Component Value Date   TSH 1.764 02/06/2020   Lipids: Lab Results  Component Value Date   CHOL 81 02/28/2019   HDL 47 02/28/2019   LDLCALC 29 02/28/2019   TRIG 27 02/28/2019   CHOLHDL 1.7 02/28/2019   HgbA1c: Lab Results  Component Value Date   HGBA1C 4.7 (L) 02/28/2019    Radiology/Studies:  CT Renal Stone Study  Result Date: 03/16/2020 CLINICAL DATA:  [place]. Abrasions to lower back. EXAM: CT ABDOMEN AND PELVIS WITHOUT CONTRAST TECHNIQUE: Multidetector CT imaging of the abdomen and pelvis was performed following the standard protocol without IV contrast. COMPARISON:  08/11/2018 FINDINGS: Lower chest: The lung bases are clear of an acute process. No pleural effusions or pneumothorax. The heart is normal in size. No pericardial effusion. Coronary artery and aortic calcifications are noted. Small hiatal hernia.  Remote healed rib fractures are noted. No definite acute fractures. Hepatobiliary: Cirrhotic changes involving the liver appears stable. No obvious hepatic lesions. Small layering gallstones are noted the gallbladder. No CT findings to suggest acute cholecystitis. No common bile duct dilatation. Pancreas: Mild pancreatic atrophy. No mass, inflammation or ductal dilatation. Spleen: Normal size. No focal lesions. Adrenals/Urinary Tract: The adrenal glands are normal. No renal, ureteral or bladder calculi are identified. No worrisome renal lesions. Small exophytic  hyperdense lesion projecting off the lower pole region of the left kidney is stable and likely a benign hemorrhagic cyst. No obvious bladder mass. Visualization of bladder is limited due to artifact from bilateral hip prostheses. Stomach/Bowel: The stomach, duodenum, small bowel and colon are grossly normal without oral contrast. No acute inflammatory changes, mass lesions or obstructive findings. There is fluid noted in the colon which can be seen with diarrhea. The terminal ileum and appendix appear normal. Vascular/Lymphatic: Aortic calcifications but no aneurysm. No mesenteric or retroperitoneal mass, adenopathy or hematoma. Reproductive: The prostate gland and seminal vesicles are grossly normal but limited visualization. Other: No free pelvic fluid collections or pelvic hematoma. No free air or free fluid is identified. Musculoskeletal: Advanced degenerative changes in the lumbar spine but no acute fracture is identified. The bony pelvis is intact. No pelvic fractures. The pubic symphysis and SI joints are intact. There are bilateral hip prostheses with significant artifact but no obvious complicating features are identified. IMPRESSION: 1. No acute abdominal/pelvic findings, mass lesions or adenopathy. 2. Stable cirrhotic changes involving the liver. 3. Cholelithiasis. 4. No acute bony injury is identified. 5. Fluid in the colon can be seen with  diarrhea. 6. Aortic atherosclerosis. Aortic Atherosclerosis (ICD10-I70.0). Electronically Signed   By: Marijo Sanes M.D.   On: 03/16/2020 05:09   Assessment and Plan:   1. Persistent atrial fibrillation with RVR: -Recently dx with AF during a hospital course 9/21 at which time was started on IV diltiazem and underwent unsuccessful cardioversion. OP plan was to start Flecainide with repeat cardioversion however he then presented to the hospital after a syncopal episode found to be once again in RVR. Pt reported never starting the Flecainide. He underwent stress testing while hospitalized which was negative. Repeat cardioversion performed 02/23/20 with AF recurrence again on 02/28/20 at which time he was started on Amiodarone. His beta blocker was stopped out of concern that syncope was secondary to bradycardia per Dr. Rayann Heman. Amiodarone plan was for 400mg  BIDx 1 week then 200mg  QD.  -He once again presented to [facility] after a mechanical fall found to be in AF with RVR. He was placed on Amiodarone infusion with conversion to NSR.  -Continue Amio gtt for now and convert to PO likely tomorrow  -No Ca+ channel or beta blockers due to hx of syncope felt to be secondary to bradycardia per Dr. Rayann Heman.  -Continue anticoagulation with Eliquis  -CHA2DS2VASc at least 4   2. Chronic diastolic CHF: -Last echocardiogram 02/2020 with normal LV function  -Appears euvolemic on exam today  -Lasix currently on hold per IM   3. HTN: -Stable, 150/82>138/83>152/76 -Continue current regimen   4. Recurrent falls: -Recent hospitalization with discharge to CIR for rehabilitation. He was discharged home with home assistance with friends and family 03/11/20. Unfortunately he sustained a mechanical fall after attempting to sit in his chair  -Imaging with no acute fracture     For questions or updates, please contact [place] Please consult [email or web address] for contact info under Cardiology/STEMI.   Lyndel Safe NP-C [facility] Pager: [phone] 03/16/2020 12:06 PM   History and all data above reviewed.  Patient examined.  I agree with the findings as above.  The patient presents after a mechanical fall and is in atrial fib.  He has a complicated history with this and is on oral amiodarone.  On presentation this time he converted with IV amio.  He has been on anticoagulation.  He does not feel this.  He has  had problems with symptomatic bradycardia.   Reviewing notes from last year he had a cardiac arrest that appears to have been bradycardia.  He did not have LOC this admisison.  He did not have any palpitations, presyncope, chest pain.  He called EMS twice.  The first time they helped him to his chair.  The second time he called because his back hurt where he fell.      The patient exam reveals [id number]  ,  Lungs: Clear  ,  Abd: Positive bowel sounds, no rebound no guarding, Ext Mild diffuse edema.    .  All available labs, radiology testing, previous records reviewed. Agree with documented assessment and plan. Atrial fib:  Now back in NSR.  Continue IV amio tonight and change to PO in the AM. Avoid other AV nodal blocking agents. OK to continue anticoagulation.  No further imaging or testing is indicated.    Jeneen Rinks Arabell Neria  1:46 PM  03/16/2020

## 2020-03-16 NOTE — ED Notes (Signed)
Pt had liquid BM. States he ate old food when he was discharged home. Pt linen changed and primofit placed. Redness and rash noted to groin and buttocks area, MD made aware.

## 2020-03-16 NOTE — ED Notes (Signed)
Pt placed on bedpan

## 2020-03-16 NOTE — Evaluation (Signed)
Physical Therapy Evaluation Patient Details Name: Darryl Diaz MRN: [id number] DOB: [birth year] Today's Date: 03/16/2020   History of Present Illness  79yo male with recent admission 10/16 to 10/29 due to syncope, went to CIR then home. Was doing well until he fell at home, called EMS who got him up but he later called them again due to severe pain and RLE radiculopathy. Found to be in A-fib with RVR in the ED, also with diarrhea and groin rash. 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  Clinical Impression   Patient received in bed on bedpan, reporting he is finished and able to bridge for PT to remove bedpan out from under him. Able to roll with S and assist for line management, then able to get to EOB with MinA for boosting up and balance on ED stretcher. Eval limited by recurrent need for urgent BMs on bedpan. Returned to supine and left on bedpan with all needs met, RN present and aware of patient status; RN able to confirm that he did have ventricular bigeminy with low level mobility today. Left on ED stretcher with all needs met. May be able to return home only if he has true 24/7 assistance- if not will need to consider ALF vs SNF.     Follow Up Recommendations Home health PT;Supervision/Assistance - 24 hour;Other (comment) (only if he truly has 24/7A at home- if not he will need SNF or ALF)    Equipment Recommendations  Rolling walker with 5" wheels;3in1 (PT)    Recommendations for Other Services       Precautions / Restrictions Precautions Precautions: Fall;Other (comment) Precaution Comments: hx of falls, mild impulsivity, watch HR Restrictions Weight Bearing Restrictions: No      Mobility  Bed Mobility Overal bed mobility: Needs Assistance Bed Mobility: Supine to Sit;Rolling Rolling: Supervision   Supine to sit: Min assist Sit to supine: Min guard    General bed mobility comments: MinA to get to edge of ED stretcher and balance, limited by another sudden urge to have another BM    Transfers                 General transfer comment: deferred- sudden urge for BM  Ambulation/Gait             General Gait Details: deferred- urgent need to have a BM  Stairs            Wheelchair Mobility    Modified Rankin (Stroke Patients Only)       Balance Overall balance assessment: Needs assistance Sitting-balance support: Bilateral upper extremity supported;Feet unsupported Sitting balance-Leahy Scale: Fair Sitting balance - Comments: MinA to maintain balance at edge of ED stretcher                                     Pertinent Vitals/Pain Pain Assessment: No/denies pain Faces Pain Scale: No hurt Pain Intervention(s): Limited activity within patient's tolerance;Monitored during session    [place] expects to be discharged to:: Private residence Living Arrangements: Alone Available Help at Discharge: Family;Friend(s);Available PRN/intermittently (family in [place] that can help; son is coming from [place] and is self-employed so can stay as long as needed) Type of Home: House Home Access: Level entry     Home Layout: Two level;Able to live on main  level with bedroom/bathroom;Full bath on main level Home Equipment: Walker - 2 wheels;Walker - 4 wheels;Bedside commode;Wheelchair - manual;Shower seat - built in;Hand held shower head;Adaptive equipment Additional Comments: pt drives and does his own grocery shopping. patient reports prior to COVID he was swimming 2-3x/week at [facility] and would like to get back to that.    Prior Function Level of Independence: Independent with assistive device(s)         Comments: has been using RW and rollator at home; reports he manages his own medicines and bills     Hand Dominance   Dominant Hand: Right    Extremity/Trunk Assessment   Upper  Extremity Assessment Upper Extremity Assessment: Generalized weakness    Lower Extremity Assessment Lower Extremity Assessment: Generalized weakness    Cervical / Trunk Assessment Cervical / Trunk Assessment: Kyphotic  Communication   Communication: No difficulties  Cognition Arousal/Alertness: Awake/alert   Overall Cognitive Status: Within Functional Limits for tasks assessed Area of Impairment: Safety/judgement;Problem solving;Awareness                         Safety/Judgement: Decreased awareness of safety;Decreased awareness of deficits Awareness: Emergent Problem Solving: Slow processing;Requires verbal cues General Comments: some possible STM deficits still present, but did well with cue and command following today      General Comments      Exercises     Assessment/Plan    PT Assessment Patient needs continued PT services  PT Problem List Decreased strength;Decreased activity tolerance;Decreased balance;Decreased mobility;Decreased cognition;Decreased knowledge of use of DME;Decreased safety awareness;Cardiopulmonary status limiting activity;Pain;Decreased skin integrity;Decreased coordination       PT Treatment Interventions DME instruction;Gait training;Functional mobility training;Therapeutic activities;Therapeutic exercise;Balance training;Neuromuscular re-education;Cognitive remediation;Patient/family education;Modalities    PT Goals (Current goals can be found in the Care Plan section)  Acute Rehab PT Goals Patient Stated Goal: go home if able PT Goal Formulation: With patient Time For Goal Achievement: 03/30/20 Potential to Achieve Goals: Fair    Frequency Min 3X/week   Barriers to discharge        Co-evaluation               AM-PAC PT "6 Clicks" Mobility  Outcome Measure Help needed turning from your back to your side while in a flat bed without using bedrails?: None Help needed moving from lying on your back to sitting on the  side of a flat bed without using bedrails?: A Little Help needed moving to and from a bed to a chair (including a wheelchair)?: A Little Help needed standing up from a chair using your arms (e.g., wheelchair or bedside chair)?: A Lot Help needed to walk in hospital room?: A Little Help needed climbing 3-5 steps with a railing? : A Lot 6 Click Score: 17    End of Session   Activity Tolerance: Other (comment) (session limited due to need for multiple liquidy BMs) Patient left: in bed;with call bell/phone within reach (ED stretcher with rails up) Nurse Communication: Mobility status PT Visit Diagnosis: History of falling (Z91.81);Muscle weakness (generalized) (M62.81);Difficulty in walking, not elsewhere classified (R26.2);Pain;Unsteadiness on feet (R26.81);Repeated falls (R29.6) Pain - Right/Left: Right Pain - part of body: Leg    Time: 5945-8592 PT Time Calculation (min) (ACUTE ONLY): 18 min   Charges:   PT Evaluation $PT Eval High Complexity: 1 High          Oley Lahaie U PT, DPT, PN1   Supplemental Physical Therapist [place]    Pager  [phone] Acute Rehab Office [phone]

## 2020-03-17 ENCOUNTER — Encounter (HOSPITAL_COMMUNITY): Payer: Self-pay | Admitting: Internal Medicine

## 2020-03-17 DIAGNOSIS — K7030 Alcoholic cirrhosis of liver without ascites: Secondary | ICD-10-CM | POA: Diagnosis not present

## 2020-03-17 DIAGNOSIS — F329 Major depressive disorder, single episode, unspecified: Secondary | ICD-10-CM | POA: Diagnosis present

## 2020-03-17 DIAGNOSIS — I2510 Atherosclerotic heart disease of native coronary artery without angina pectoris: Secondary | ICD-10-CM | POA: Diagnosis present

## 2020-03-17 DIAGNOSIS — D509 Iron deficiency anemia, unspecified: Secondary | ICD-10-CM | POA: Diagnosis present

## 2020-03-17 DIAGNOSIS — I4819 Other persistent atrial fibrillation: Secondary | ICD-10-CM | POA: Diagnosis not present

## 2020-03-17 DIAGNOSIS — E871 Hypo-osmolality and hyponatremia: Secondary | ICD-10-CM | POA: Diagnosis not present

## 2020-03-17 DIAGNOSIS — Z96641 Presence of right artificial hip joint: Secondary | ICD-10-CM | POA: Diagnosis present

## 2020-03-17 DIAGNOSIS — K219 Gastro-esophageal reflux disease without esophagitis: Secondary | ICD-10-CM | POA: Diagnosis present

## 2020-03-17 DIAGNOSIS — R296 Repeated falls: Secondary | ICD-10-CM | POA: Diagnosis present

## 2020-03-17 DIAGNOSIS — I5032 Chronic diastolic (congestive) heart failure: Secondary | ICD-10-CM | POA: Diagnosis not present

## 2020-03-17 DIAGNOSIS — G4700 Insomnia, unspecified: Secondary | ICD-10-CM | POA: Diagnosis present

## 2020-03-17 DIAGNOSIS — W010XXA Fall on same level from slipping, tripping and stumbling without subsequent striking against object, initial encounter: Secondary | ICD-10-CM | POA: Diagnosis present

## 2020-03-17 DIAGNOSIS — M1990 Unspecified osteoarthritis, unspecified site: Secondary | ICD-10-CM | POA: Diagnosis present

## 2020-03-17 DIAGNOSIS — I10 Essential (primary) hypertension: Secondary | ICD-10-CM

## 2020-03-17 DIAGNOSIS — G4733 Obstructive sleep apnea (adult) (pediatric): Secondary | ICD-10-CM | POA: Diagnosis present

## 2020-03-17 DIAGNOSIS — W19XXXA Unspecified fall, initial encounter: Secondary | ICD-10-CM | POA: Diagnosis not present

## 2020-03-17 DIAGNOSIS — K760 Fatty (change of) liver, not elsewhere classified: Secondary | ICD-10-CM | POA: Diagnosis present

## 2020-03-17 DIAGNOSIS — N179 Acute kidney failure, unspecified: Secondary | ICD-10-CM | POA: Diagnosis not present

## 2020-03-17 DIAGNOSIS — B370 Candidal stomatitis: Secondary | ICD-10-CM | POA: Diagnosis present

## 2020-03-17 DIAGNOSIS — E876 Hypokalemia: Secondary | ICD-10-CM | POA: Diagnosis not present

## 2020-03-17 DIAGNOSIS — Y92009 Unspecified place in unspecified non-institutional (private) residence as the place of occurrence of the external cause: Secondary | ICD-10-CM | POA: Diagnosis not present

## 2020-03-17 DIAGNOSIS — F1021 Alcohol dependence, in remission: Secondary | ICD-10-CM | POA: Diagnosis present

## 2020-03-17 DIAGNOSIS — Z20822 Contact with and (suspected) exposure to covid-19: Secondary | ICD-10-CM | POA: Diagnosis present

## 2020-03-17 DIAGNOSIS — I110 Hypertensive heart disease with heart failure: Secondary | ICD-10-CM | POA: Diagnosis present

## 2020-03-17 DIAGNOSIS — E785 Hyperlipidemia, unspecified: Secondary | ICD-10-CM | POA: Diagnosis present

## 2020-03-17 DIAGNOSIS — A020 Salmonella enteritis: Secondary | ICD-10-CM | POA: Diagnosis present

## 2020-03-17 DIAGNOSIS — L22 Diaper dermatitis: Secondary | ICD-10-CM | POA: Diagnosis present

## 2020-03-17 DIAGNOSIS — N400 Enlarged prostate without lower urinary tract symptoms: Secondary | ICD-10-CM | POA: Diagnosis present

## 2020-03-17 DIAGNOSIS — I4891 Unspecified atrial fibrillation: Secondary | ICD-10-CM | POA: Diagnosis not present

## 2020-03-17 HISTORY — DX: Alcoholic cirrhosis of liver without ascites: K70.30

## 2020-03-17 LAB — CBC WITH DIFFERENTIAL/PLATELET
Abs Immature Granulocytes: 0.04 10*3/uL (ref 0.00–0.07)
Basophils Absolute: 0.1 10*3/uL (ref 0.0–0.1)
Basophils Relative: 1 %
Eosinophils Absolute: 0.1 10*3/uL (ref 0.0–0.5)
Eosinophils Relative: 1 %
HCT: 33.9 % — ABNORMAL LOW (ref 39.0–52.0)
Hemoglobin: 11.1 g/dL — ABNORMAL LOW (ref 13.0–17.0)
Immature Granulocytes: 1 %
Lymphocytes Relative: 8 %
Lymphs Abs: 0.7 10*3/uL (ref 0.7–4.0)
MCH: 30.8 pg (ref 26.0–34.0)
MCHC: 32.7 g/dL (ref 30.0–36.0)
MCV: 94.2 fL (ref 80.0–100.0)
Monocytes Absolute: 0.6 10*3/uL (ref 0.1–1.0)
Monocytes Relative: 7 %
Neutro Abs: 7.3 10*3/uL (ref 1.7–7.7)
Neutrophils Relative %: 82 %
Platelets: 185 10*3/uL (ref 150–400)
RBC: 3.6 MIL/uL — ABNORMAL LOW (ref 4.22–5.81)
RDW: 14.1 % (ref 11.5–15.5)
WBC: 8.7 10*3/uL (ref 4.0–10.5)
nRBC: 0 % (ref 0.0–0.2)

## 2020-03-17 LAB — MRSA PCR SCREENING: MRSA by PCR: NEGATIVE

## 2020-03-17 LAB — CREATININE, URINE, RANDOM: Creatinine, Urine: 125.52 mg/dL

## 2020-03-17 LAB — SODIUM, URINE, RANDOM: Sodium, Ur: 24 mmol/L

## 2020-03-17 LAB — OSMOLALITY, URINE: Osmolality, Ur: 529 mOsm/kg (ref 300–900)

## 2020-03-17 MED ORDER — FLUCONAZOLE 100MG IVPB
100.0000 mg | INTRAVENOUS | Status: DC
  Administered 2020-03-17 – 2020-03-19 (×3): 100 mg via INTRAVENOUS
  Filled 2020-03-17 (×3): qty 50

## 2020-03-17 MED ORDER — CIPROFLOXACIN HCL 500 MG PO TABS
500.0000 mg | ORAL_TABLET | Freq: Two times a day (BID) | ORAL | Status: DC
  Administered 2020-03-17 – 2020-03-20 (×7): 500 mg via ORAL
  Filled 2020-03-17 (×7): qty 1

## 2020-03-17 MED ORDER — AMIODARONE HCL 200 MG PO TABS: 200.0000 mg | ORAL_TABLET | Freq: Two times a day (BID) | ORAL | Status: DC

## 2020-03-17 MED ORDER — AMIODARONE HCL 200 MG PO TABS
400.0000 mg | ORAL_TABLET | Freq: Two times a day (BID) | ORAL | Status: DC
  Administered 2020-03-17 – 2020-03-20 (×7): 400 mg via ORAL
  Filled 2020-03-17 (×7): qty 2

## 2020-03-17 MED ORDER — SPIRONOLACTONE 25 MG PO TABS
25.0000 mg | ORAL_TABLET | Freq: Every day | ORAL | Status: DC
  Filled 2020-03-17: qty 1

## 2020-03-17 NOTE — Progress Notes (Addendum)
Progress Note  Patient Name: Darryl Diaz Date of Encounter: 03/17/2020  Primary Cardiologist:  Sherren Mocha, MD  Subjective   Says he did not remember to find the chair seat w/ his hands before he sat down. Not really aware of the atrial fib.  Inpatient Medications    Scheduled Meds: . amiodarone  400 mg Oral BID  . apixaban  5 mg Oral BID  . atorvastatin  20 mg Oral Daily  . ciprofloxacin  500 mg Oral BID  . escitalopram  10 mg Oral Daily  . ferrous sulfate  325 mg Oral BID WC  . folic acid  1 mg Oral Daily  . Gerhardt's butt cream   Topical QID  . loratadine  10 mg Oral Daily  . multivitamin with minerals  1 tablet Oral Q breakfast  . nystatin  5 mL Oral QID  . pantoprazole  40 mg Oral Daily  . saccharomyces boulardii  250 mg Oral BID  . tamsulosin  0.4 mg Oral QPM  . thiamine  100 mg Oral Daily   Or  . thiamine  100 mg Intravenous Daily  . traZODone  50 mg Oral QHS   Continuous Infusions: . fluconazole (DIFLUCAN) IV     PRN Meds: acetaminophen, LORazepam **OR** LORazepam, ondansetron (ZOFRAN) IV   Vital Signs    Vitals:   03/17/20 0645 03/17/20 0700 03/17/20 0715 03/17/20 0730  BP:  (!) 153/68    Pulse: 71 69 73 77  Resp: (!) 21 (!) 21 17 19   Temp:      TempSrc:      SpO2: 97% 99% 98% 100%  Weight:      Height:        Intake/Output Summary (Last 24 hours) at 03/17/2020 0744 Last data filed at 03/16/2020 2018 Gross per 24 hour  Intake 378.28 ml  Output 725 ml  Net -346.72 ml   Filed Weights   03/16/20 0901  Weight: 102.5 kg   Last Weight  Most recent update: 03/16/2020  9:02 AM   Weight  102.5 kg (225 lb 15.5 oz)           Weight change:    Telemetry    NSR - Personally Reviewed  ECG    None today - Personally Reviewed  Physical Exam   General: Well developed, well nourished, male appearing in no acute distress. Head: Normocephalic, atraumatic.  Neck: Supple without bruits, JVD not elevated. Lungs:  Resp regular and  unlabored, CTA. Heart: Regular R&R, S1, S2, no S3, S4, soft murmur; no rub. Abdomen: Soft, non-tender, non-distended with normoactive bowel sounds. No hepatomegaly. No rebound/guarding. No obvious abdominal masses. Extremities: No clubbing, cyanosis, no edema. Distal pedal pulses are 2+ bilaterally. Neuro: Alert and oriented X 3. Moves all extremities spontaneously. Psych: Normal affect.  Labs    Hematology Recent Labs  Lab 03/16/20 0508  WBC 11.5*  RBC 3.57*  HGB 11.2*  HCT 33.8*  MCV 94.7  MCH 31.4  MCHC 33.1  RDW 14.2  PLT 212    Chemistry Recent Labs  Lab 03/16/20 0508  NA 126*  K 3.5  CL 91*  CO2 23  GLUCOSE 117*  BUN 14  CREATININE 1.15  CALCIUM 8.5*  PROT 6.1*  ALBUMIN 3.3*  AST 32  ALT 15  ALKPHOS 85  BILITOT 1.2  GFRNONAA >60  ANIONGAP 12     High Sensitivity Troponin:   Recent Labs  Lab 02/22/20 1433 02/22/20 1532  TROPONINIHS 5 5  BNPNo results for input(s): BNP, PROBNP in the last 168 hours.   DDimer No results for input(s): DDIMER in the last 168 hours.   Radiology    CT Renal Stone Study  Result Date: 03/16/2020 CLINICAL DATA:  [place]. Abrasions to lower back. EXAM: CT ABDOMEN AND PELVIS WITHOUT CONTRAST TECHNIQUE: Multidetector CT imaging of the abdomen and pelvis was performed following the standard protocol without IV contrast. COMPARISON:  08/11/2018 FINDINGS: Lower chest: The lung bases are clear of an acute process. No pleural effusions or pneumothorax. The heart is normal in size. No pericardial effusion. Coronary artery and aortic calcifications are noted. Small hiatal hernia. Remote healed rib fractures are noted. No definite acute fractures. Hepatobiliary: Cirrhotic changes involving the liver appears stable. No obvious hepatic lesions. Small layering gallstones are noted the gallbladder. No CT findings to suggest acute cholecystitis. No common bile duct dilatation. Pancreas: Mild pancreatic atrophy. No mass, inflammation or  ductal dilatation. Spleen: Normal size. No focal lesions. Adrenals/Urinary Tract: The adrenal glands are normal. No renal, ureteral or bladder calculi are identified. No worrisome renal lesions. Small exophytic hyperdense lesion projecting off the lower pole region of the left kidney is stable and likely a benign hemorrhagic cyst. No obvious bladder mass. Visualization of bladder is limited due to artifact from bilateral hip prostheses. Stomach/Bowel: The stomach, duodenum, small bowel and colon are grossly normal without oral contrast. No acute inflammatory changes, mass lesions or obstructive findings. There is fluid noted in the colon which can be seen with diarrhea. The terminal ileum and appendix appear normal. Vascular/Lymphatic: Aortic calcifications but no aneurysm. No mesenteric or retroperitoneal mass, adenopathy or hematoma. Reproductive: The prostate gland and seminal vesicles are grossly normal but limited visualization. Other: No free pelvic fluid collections or pelvic hematoma. No free air or free fluid is identified. Musculoskeletal: Advanced degenerative changes in the lumbar spine but no acute fracture is identified. The bony pelvis is intact. No pelvic fractures. The pubic symphysis and SI joints are intact. There are bilateral hip prostheses with significant artifact but no obvious complicating features are identified. IMPRESSION: 1. No acute abdominal/pelvic findings, mass lesions or adenopathy. 2. Stable cirrhotic changes involving the liver. 3. Cholelithiasis. 4. No acute bony injury is identified. 5. Fluid in the colon can be seen with diarrhea. 6. Aortic atherosclerosis. Aortic Atherosclerosis (ICD10-I70.0). Electronically Signed   By: Marijo Sanes M.D.   On: 03/16/2020 05:09     Cardiac Studies   ECHO:  02/23/2020 1. Left ventricular ejection fraction, by estimation, is 60 to 65%. The  left ventricle has normal function. The left ventricle has no regional  wall motion  abnormalities. Left ventricular diastolic function could not  be evaluated.  2. Apical RV hyperkinesis. Right ventricular systolic function is normal.  The right ventricular size is mildly enlarged.  3. Left atrial size was mildly dilated.  4. The mitral valve is grossly normal. Trivial mitral valve  regurgitation.  5. The aortic valve was not well visualized. Aortic valve regurgitation  is not visualized.  6. There is dilatation of the aortic root, measuring 42 mm. There is  dilatation of the ascending aorta, measuring 39 mm.  7. The inferior vena cava is dilated in size with <50% respiratory  variability, suggesting right atrial pressure of 15 mmHg.   Patient Profile     79 y.o. male w/ hx persistent atrial fibrillation s/p multiple unsuccessful DCCVs, prior brady arrest, HTN, HLD, GERD and OSA was admitted 11/08 after a fall. Cards saw for  AF RVR.   Assessment & Plan    1. Fall:  - mechanical fall w/ subsequent back pain - deconditioning may play a role. Although he completed rehab, he is still weak - he fell the first day he was alone in the house - pt recommended for ALF placement  2. Afib RVR - recurrent, started on amio10/22 after failed DCCV - hx syncope, ?2nd bradycardia >> no BB/CCB - Hx bradycardic arrest 04/2019 >> [place], metop and amio stopped - IV amio >> po this am - CHA2DS2-VASc = 3  (age x 2, HTN)  - LA mildly dilated, RA nl size   Otherwise, per IM Principal Problem:   Fall at home, initial encounter Active Problems:   Hypertension   Hyperlipidemia   Chronic diastolic congestive heart failure (HCC)   Atrial fibrillation with RVR ([place])   Yeast infection   Diarrhea   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[place])    Signed, Rosaria Ferries , PA-C 7:44 AM 03/17/2020 Pager: [phone]  History and all data above reviewed.  Patient examined.  I agree with the findings as above.  Maintaining NSR.  No complaints overnight. The patient exam  reveals COR:RRR  ,  Lungs: Clear  ,  Abd: Positive bowel sounds, no rebound no guarding, Ext No edema  .  All available labs, radiology testing, previous records reviewed. Agree with documented assessment and plan.   Atrial fib:  He is in sinus.  Switched to PO amio.  No change in therapy. Please call with further questions.   Jeneen Rinks Antuan Limes  10:34 AM  03/17/2020

## 2020-03-17 NOTE — Consult Note (Signed)
   [facility] Inpatient Consult   03/17/2020  Darryl Diaz 01/04/1941 [id number]  [place] Organization [ACO] Patient:  Medicare NextGen    Referral call received from inpatient [facility] regarding patient's readmission and issues for post hospital follow up barriers and issues regarding post hospital care. Patient is eligible for [facility] CM services.  Patient is in pending status with [facility] for chronic disease management services.     Plan: Will continue to follow with Inpatient Transition Of Care [TOC] team member for post hospital transitional needs,   Of note, [facility] Care Management services does not replace or interfere with any services that are needed or arranged by inpatient [facility] care management team.  For additional questions or referrals please contact:  Natividad Brood, RN BSN [place] [facility] Liaison  [phone] business mobile phone Toll free office [phone]  Fax number: [phone] [email or web address]

## 2020-03-17 NOTE — Progress Notes (Signed)
[facility] PROGRESS NOTE    Progress Note  Darryl Diaz  FFM:[id number] DOB: 11/10/1940 DOA: 03/16/2020 PCP: [name], MD     Brief Narrative:   Darryl Diaz is an 79 y.o. male past medical history significant for A. fib on anticoagulation obstructive sleep apnea, CAD s/p cardiac arrest alcohol dependence recently discharged from the hospital by the cardiology service for syncope, with an unsuccessful DCCV and was most recently started on flecainide who eventually had a successful DCCV on 02/26/2020 but reverted to sinus rhythm so was started on amiodarone discharged to skilled nursing facility on 03/11/2020 to the following the ED was found in A. fib with RVR started on amiodarone drip and cardiology was consulted.  Assessment/Plan:   Fall at home, initial encounter Mechanical fall at home which will need ALF placement. Physical therapy and Occupational Therapy have been consulted.  A. fib with RVR: He has been a repeated issue converted several times.  Was started on amiodarone drip cardiology was consulted. After starting amiodarone he converted back to sinus rhythm he was continued on Eliquis. Cardiology recommended to continue IV amiodarone overnight and converted to p.o. in the morning.  Avoid any other beta-blockade agents as he could develop bradycardia.  Diarrhea secondarily to Salmonella: Started yesterday night, he relates he has more than 6 bowel movements in the last 24 hours.  Remained afebrile CBC showed a mildly elevated white count we will repeat today. Unlikely to be C. Difficile. Stool studies on Ila species positive, will start in the ciprofloxacin.  Yeast infection: Both oral thrush and dermatitis.  We will go ahead and discontinue nystatin and start him on IV Diflucan continue your her blood cream.  Mild hyponatremia: Early secondary to his multiple bowel movements, hold Lasix or urinary sodium urinary creatinine and urine osmole lites.   He just has trace edema on physical exam chest x-ray looks clean no JVD.  Probably his lower extremity edema is chronic. He does have a CT scan that showed cirrhosis might benefit from low dose of Aldactone as an outpatient.  Essential hypertension Well-controlled without medication continue current management. Probably we can manage his fluid status and blood pressure with Justin Aldactone as he has a history of cirrhosis, in the setting of alcohol use and CT scan that showed cirrhosis.  Hyperlipidemia: Continue Lipitor.  Chronic diastolic heart failure: Appears to be compensated some trace edema in the lower extremity restart Lasix and other home medications.  DVT prophylaxis: eliquis Family Communication:none Status is: Observation  The patient will require care spanning > 2 midnights and should be moved to inpatient because: Hemodynamically unstable  Dispo: The patient is from: Home              Anticipated d/c is to: Home              Anticipated d/c date is: 1 day              Patient currently is not medically stable to d/c.        Code Status:     Code Status Orders  (From admission, onward)         Start     Ordered   03/16/20 0807  Full code  Continuous        03/16/20 0810        Code Status History    Date Active Date Inactive Code Status Order ID Comments User Context   03/06/2020 0100 03/11/2020 1501 Full Code  [id number]  Cathlyn Parsons, PA-C Inpatient   03/06/2020 0100 03/06/2020 0100 Full Code [id number]  Elizabeth Sauer Inpatient   02/22/2020 1704 03/06/2020 0029 Full Code [id number]  Freada Bergeron, MD ED   02/05/2020 2210 02/08/2020 0218 Full Code [id number]  Barrett, Evelene Croon, PA-C ED   10/15/2019 1204 10/16/2019 1922 Full Code [id number]  Norman Herrlich Inpatient   04/20/2019 1844 04/23/2019 1723 Full Code [id number]  Donita Brooks, NP ED   02/25/2019 2021 03/05/2019 1432 Full Code [id number]  Shela Leff, MD ED   08/11/2018  1707 08/12/2018 1446 Full Code [id number]  Barb Merino, MD Inpatient   04/30/2016 0253 05/04/2016 1828 Full Code [id number]  Wylene Simmer, MD Inpatient   03/30/2016 1347 04/04/2016 1440 Full Code [id number]  Rondel Jumbo, PA-C ED   03/30/2016 1347 03/30/2016 1347 Full Code [id number]  Elease Hashimoto ED   Advance Care Planning Activity        IV Access:    Peripheral IV   Procedures and diagnostic studies:   CT Renal Stone Study  Result Date: 03/16/2020 CLINICAL DATA:  [place]. Abrasions to lower back. EXAM: CT ABDOMEN AND PELVIS WITHOUT CONTRAST TECHNIQUE: Multidetector CT imaging of the abdomen and pelvis was performed following the standard protocol without IV contrast. COMPARISON:  08/11/2018 FINDINGS: Lower chest: The lung bases are clear of an acute process. No pleural effusions or pneumothorax. The heart is normal in size. No pericardial effusion. Coronary artery and aortic calcifications are noted. Small hiatal hernia. Remote healed rib fractures are noted. No definite acute fractures. Hepatobiliary: Cirrhotic changes involving the liver appears stable. No obvious hepatic lesions. Small layering gallstones are noted the gallbladder. No CT findings to suggest acute cholecystitis. No common bile duct dilatation. Pancreas: Mild pancreatic atrophy. No mass, inflammation or ductal dilatation. Spleen: Normal size. No focal lesions. Adrenals/Urinary Tract: The adrenal glands are normal. No renal, ureteral or bladder calculi are identified. No worrisome renal lesions. Small exophytic hyperdense lesion projecting off the lower pole region of the left kidney is stable and likely a benign hemorrhagic cyst. No obvious bladder mass. Visualization of bladder is limited due to artifact from bilateral hip prostheses. Stomach/Bowel: The stomach, duodenum, small bowel and colon are grossly normal without oral contrast. No acute inflammatory changes, mass lesions or obstructive findings. There is fluid  noted in the colon which can be seen with diarrhea. The terminal ileum and appendix appear normal. Vascular/Lymphatic: Aortic calcifications but no aneurysm. No mesenteric or retroperitoneal mass, adenopathy or hematoma. Reproductive: The prostate gland and seminal vesicles are grossly normal but limited visualization. Other: No free pelvic fluid collections or pelvic hematoma. No free air or free fluid is identified. Musculoskeletal: Advanced degenerative changes in the lumbar spine but no acute fracture is identified. The bony pelvis is intact. No pelvic fractures. The pubic symphysis and SI joints are intact. There are bilateral hip prostheses with significant artifact but no obvious complicating features are identified. IMPRESSION: 1. No acute abdominal/pelvic findings, mass lesions or adenopathy. 2. Stable cirrhotic changes involving the liver. 3. Cholelithiasis. 4. No acute bony injury is identified. 5. Fluid in the colon can be seen with diarrhea. 6. Aortic atherosclerosis. Aortic Atherosclerosis (ICD10-I70.0). Electronically Signed   By: Marijo Sanes M.D.   On: 03/16/2020 05:09     Medical Consultants:    None.  Anti-Infectives:   none  Subjective:    [name] no complains of back pain feels great.  Objective:  Vitals:   03/17/20 0300 03/17/20 0400 03/17/20 0500 03/17/20 0600  BP: (!) 116/48 (!) 122/59 134/61 (!) 146/79  Pulse: 71 68 77 73  Resp: (!) 22 (!) 21 (!) 27 20  Temp:      TempSrc:      SpO2: 93% 96% 100% 98%  Weight:      Height:       SpO2: 98 %   Intake/Output Summary (Last 24 hours) at 03/17/2020 0648 Last data filed at 03/16/2020 2018 Gross per 24 hour  Intake 378.28 ml  Output 725 ml  Net -346.72 ml   Filed Weights   03/16/20 0901  Weight: 102.5 kg    Exam: General exam: In no acute distress. Respiratory system: Good air movement and clear to auscultation. Cardiovascular system: S1 & S2 heard, RRR. No JVD. Gastrointestinal system:  Abdomen is nondistended, soft and nontender.  Central nervous system: Alert and oriented. No focal neurological deficits. Extremities: Trace edema Skin: No rashes, lesions or ulcers Psychiatry: Judgement and insight appear normal. Mood & affect appropriate.    Data Reviewed:    Labs: Basic Metabolic Panel: Recent Labs  Lab 03/16/20 0508  NA 126*  K 3.5  CL 91*  CO2 23  GLUCOSE 117*  BUN 14  CREATININE 1.15  CALCIUM 8.5*   GFR Estimated Creatinine Clearance: 65.6 mL/min (by C-G formula based on SCr of 1.15 mg/dL). Liver Function Tests: Recent Labs  Lab 03/16/20 0508  AST 32  ALT 15  ALKPHOS 85  BILITOT 1.2  PROT 6.1*  ALBUMIN 3.3*   No results for input(s): LIPASE, AMYLASE in the last 168 hours. No results for input(s): AMMONIA in the last 168 hours. Coagulation profile Recent Labs  Lab 03/16/20 0508  INR 1.3*   COVID-19 Labs  No results for input(s): DDIMER, FERRITIN, LDH, CRP in the last 72 hours.  Lab Results  Component Value Date   SARSCOV2NAA NEGATIVE 03/16/2020   [place] NEGATIVE 02/22/2020   [place] NEGATIVE 02/05/2020   [place] NEGATIVE 10/11/2019    CBC: Recent Labs  Lab 03/16/20 0508  WBC 11.5*  NEUTROABS 9.9*  HGB 11.2*  HCT 33.8*  MCV 94.7  PLT 212   Cardiac Enzymes: No results for input(s): CKTOTAL, CKMB, CKMBINDEX, TROPONINI in the last 168 hours. BNP (last 3 results) No results for input(s): PROBNP in the last 8760 hours. CBG: No results for input(s): GLUCAP in the last 168 hours. D-Dimer: No results for input(s): DDIMER in the last 72 hours. Hgb A1c: No results for input(s): HGBA1C in the last 72 hours. Lipid Profile: No results for input(s): CHOL, HDL, LDLCALC, TRIG, CHOLHDL, LDLDIRECT in the last 72 hours. Thyroid function studies: No results for input(s): TSH, T4TOTAL, T3FREE, THYROIDAB in the last 72 hours.  Invalid input(s): FREET3 Anemia work up: No results for input(s): VITAMINB12, FOLATE, FERRITIN,  TIBC, IRON, RETICCTPCT in the last 72 hours. Sepsis Labs: Recent Labs  Lab 03/16/20 0508  WBC 11.5*   Microbiology Recent Results (from the past 240 hour(s))  Respiratory Panel by RT PCR (Flu A&B, Covid) - Nasopharyngeal Swab     Status: None   Collection Time: 03/16/20  6:34 AM   Specimen: Nasopharyngeal Swab  Result Value Ref Range Status   SARS Coronavirus 2 by RT PCR NEGATIVE NEGATIVE Final    Comment: (NOTE) SARS-CoV-2 target nucleic acids are NOT DETECTED.  The SARS-CoV-2 RNA is generally detectable in upper respiratoy specimens during the acute phase of infection. The lowest concentration of SARS-CoV-2 viral copies  this assay can detect is 131 copies/mL. A negative result does not preclude SARS-Cov-2 infection and should not be used as the sole basis for treatment or other patient management decisions. A negative result may occur with  improper specimen collection/handling, submission of specimen other than nasopharyngeal swab, presence of viral mutation(s) within the areas targeted by this assay, and inadequate number of viral copies (<131 copies/mL). A negative result must be combined with clinical observations, patient history, and epidemiological information. The expected result is Negative.  Fact Sheet for Patients:  PinkCheek.be  Fact Sheet for Healthcare Providers:  GravelBags.it  This test is no t yet approved or cleared by the [place] FDA and  has been authorized for detection and/or diagnosis of SARS-CoV-2 by FDA under an Emergency Use Authorization (EUA). This EUA will remain  in effect (meaning this test can be used) for the duration of the COVID-19 declaration under Section 564(b)(1) of the Act, 21 U.S.C. section 360bbb-3(b)(1), unless the authorization is terminated or revoked sooner.     Influenza A by PCR NEGATIVE NEGATIVE Final   Influenza B by PCR NEGATIVE NEGATIVE Final    Comment:  (NOTE) The Xpert Xpress SARS-CoV-2/FLU/RSV assay is intended as an aid in  the diagnosis of influenza from Nasopharyngeal swab specimens and  should not be used as a sole basis for treatment. Nasal washings and  aspirates are unacceptable for Xpert Xpress SARS-CoV-2/FLU/RSV  testing.  Fact Sheet for Patients: PinkCheek.be  Fact Sheet for Healthcare Providers: GravelBags.it  This test is not yet approved or cleared by the [place] FDA and  has been authorized for detection and/or diagnosis of SARS-CoV-2 by  FDA under an Emergency Use Authorization (EUA). This EUA will remain  in effect (meaning this test can be used) for the duration of the  Covid-19 declaration under Section 564(b)(1) of the Act, 21  U.S.C. section 360bbb-3(b)(1), unless the authorization is  terminated or revoked. Performed at [facility], [place] [address], [place], [place] [address]   Gastrointestinal Panel by PCR , Stool     Status: Abnormal   Collection Time: 03/16/20  9:06 AM   Specimen: Stool  Result Value Ref Range Status   Campylobacter species NOT DETECTED NOT DETECTED Final   Plesimonas shigelloides NOT DETECTED NOT DETECTED Final   Salmonella species DETECTED (A) NOT DETECTED Final    Comment: RESULT CALLED TO, READ BACK BY AND VERIFIED WITH: MAGGIE BARBER AT 1505 03/16/20 SDR    Yersinia enterocolitica NOT DETECTED NOT DETECTED Final   Vibrio species NOT DETECTED NOT DETECTED Final   Vibrio cholerae NOT DETECTED NOT DETECTED Final   Enteroaggregative E coli (EAEC) NOT DETECTED NOT DETECTED Final   Enteropathogenic E coli (EPEC) NOT DETECTED NOT DETECTED Final   Enterotoxigenic E coli (ETEC) NOT DETECTED NOT DETECTED Final   Shiga like toxin producing E coli (STEC) NOT DETECTED NOT DETECTED Final   Shigella/Enteroinvasive E coli (EIEC) NOT DETECTED NOT DETECTED Final   Cryptosporidium NOT DETECTED NOT DETECTED Final   Cyclospora  cayetanensis NOT DETECTED NOT DETECTED Final   Entamoeba histolytica NOT DETECTED NOT DETECTED Final   Giardia lamblia NOT DETECTED NOT DETECTED Final   Adenovirus F40/41 NOT DETECTED NOT DETECTED Final   Astrovirus NOT DETECTED NOT DETECTED Final   Norovirus GI/GII NOT DETECTED NOT DETECTED Final   Rotavirus A NOT DETECTED NOT DETECTED Final   Sapovirus (I, II, IV, and V) NOT DETECTED NOT DETECTED Final    Comment: Performed at [facility], [place], [place] [address]     Medications:   . apixaban  5 mg Oral BID  . atorvastatin  20 mg Oral Daily  . escitalopram  10 mg Oral Daily  . ferrous sulfate  325 mg Oral BID WC  . folic acid  1 mg Oral Daily  . Gerhardt's butt cream   Topical QID  . loratadine  10 mg Oral Daily  . multivitamin with minerals  1 tablet Oral Q breakfast  . nystatin  5 mL Oral QID  . pantoprazole  40 mg Oral Daily  . saccharomyces boulardii  250 mg Oral BID  . tamsulosin  0.4 mg Oral QPM  . thiamine  100 mg Oral Daily   Or  . thiamine  100 mg Intravenous Daily  . traZODone  50 mg Oral QHS   Continuous Infusions: . amiodarone 30 mg/hr (03/16/20 2044)  . cefTRIAXone (ROCEPHIN)  IV Stopped (03/16/20 1608)      LOS: 0 days   Charlynne Cousins  [facility]  03/17/2020, 6:48 AM

## 2020-03-17 NOTE — Evaluation (Addendum)
Occupational Therapy Evaluation Patient Details Name: Darryl Diaz MRN: [id number] DOB: 01-04-1941 Today's Date: 03/17/2020    History of Present Illness 79yo male with recent admission 10/16 to 10/29 due to syncope, went to CIR then home. Was doing well until he fell at home, called EMS who got him up but he later called them again due to severe pain and RLE radiculopathy. Found to be in A-fib with RVR in the ED, also with diarrhea and groin rash. 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   Clinical Impression   This 79 y/o male presents with the above. Pt with recent admit, CIR stay and d/c home; he reports after returning home he was using rollator for mobility tasks and performing ADL with mod independence. Pt very pleasant and agreeable to therapy session, presenting with the above and below listed deficits. Pt requiring overall minA for room level mobility tasks using RW, up to maxA for toileting ADL at this time; requiring min VCs for safety and for safe RW use throughout. VSS on RA. Pt reports he typically lives alone, states he potentially has family members who could stay initially, but in talking to pt sounds as if he is also open to [facility] as well. He will benefit from continued acute OT services and recommend post acute rehab services to progress his overall safety and independence with ADL and mobility. Given pt may only have interim access to 24hr supervision, he may benefit from transition to ALF after discharge (pending progress/safety awareness). Pt may also benefit from the [facility] Program with [facility] if eligible. Will need to confirm available support with pt/pt's family. OT to continue to follow and monitor for pt progress.     Follow Up Recommendations  Supervision/Assistance - 24 hour;Home health OT;SNF (Home w/ 24hr vs SNF/ALF - likely needs ALF)     Equipment Recommendations  None recommended by OT           Precautions / Restrictions Precautions Precautions: Fall;Other (comment) Precaution Comments: hx of falls, mild impulsivity, watch HR Restrictions Weight Bearing Restrictions: No      Mobility Bed Mobility Overal bed mobility: Needs Assistance Bed Mobility: Supine to Sit     Supine to sit: Min guard     General bed mobility comments: for safety, lines; HOB elevated     Transfers Overall transfer level: Needs assistance Equipment used: Rolling walker (2 wheeled) Transfers: Sit to/from [facility] Sit to Stand: Min assist Stand pivot transfers: Min assist       General transfer comment: use of momentum and boosting assist to rise to standing; steadying assist and min VCs for safe RW use throughout, pt with posterior bias with standing, stood from EOB and BSC     Balance Overall balance assessment: Needs assistance;History of Falls Sitting-balance support: Feet unsupported Sitting balance-Leahy Scale: Good     Standing balance support: Bilateral upper extremity supported Standing balance-Leahy Scale: Poor Standing balance comment: reliant on UE support, posterior bias                           ADL either performed or assessed with clinical judgement   ADL Overall ADL's : Needs assistance/impaired Eating/Feeding: Modified independent;Sitting   Grooming: Set up;Supervision/safety;Sitting   Upper Body Bathing: Min guard;Sitting   Lower Body Bathing: Moderate assistance;Sit to/from stand  Upper Body Dressing : Min guard;Sitting   Lower Body Dressing: Moderate assistance;Sit to/from stand   Toilet Transfer: Minimal assistance;Stand-pivot;BSC;RW Armed forces technical officer Details (indicate cue type and reason): use of BSC due to quickly needing to have BM Toileting- Clothing Manipulation and Hygiene: Maximal assistance;Sit to/from stand Toileting - Clothing Manipulation Details  (indicate cue type and reason): requires assist for pericare, diahrrea today     Functional mobility during ADLs: Minimal assistance;Rolling walker General ADL Comments: pt mobilizing around EOB to recliner post toileting, requiring minA for balance and min cues for safety                   Pertinent Vitals/Pain Pain Assessment: No/denies pain     Hand Dominance Right   Extremity/Trunk Assessment Upper Extremity Assessment Upper Extremity Assessment: Generalized weakness   Lower Extremity Assessment Lower Extremity Assessment: Defer to PT evaluation   Cervical / Trunk Assessment Cervical / Trunk Assessment: Kyphotic   Communication Communication Communication: No difficulties   Cognition Arousal/Alertness: Awake/alert Behavior During Therapy: WFL for tasks assessed/performed (mildly impulsive) Overall Cognitive Status: Impaired/Different from baseline Area of Impairment: Safety/judgement;Problem solving;Awareness                   Current Attention Level: Selective     Safety/Judgement: Decreased awareness of safety;Decreased awareness of deficits Awareness: Emergent Problem Solving: Slow processing;Requires verbal cues General Comments: pt requiring min cues for safety, but overall following commands well, appears to be good historian    General Comments  VSS on RA    Exercises     Shoulder Instructions      Home Living Family/patient expects to be discharged to:: Private residence Living Arrangements: Alone Available Help at Discharge: Family;Friend(s);Available PRN/intermittently (reports options for different family members to stay initial) Type of Home: House Home Access: Level entry     Home Layout: Two level;Able to live on main level with bedroom/bathroom;Full bath on main level Alternate Level Stairs-Number of Steps: does not go upstairs   Bathroom Shower/Tub: Occupational psychologist: Handicapped height     Home Equipment:  Environmental consultant - 2 wheels;Walker - 4 wheels;Bedside commode;Wheelchair - manual;Shower seat - built in;Hand held shower head;Adaptive equipment Adaptive Equipment: Long-handled shoe horn Additional Comments: pt typically drives and does his own grocery shopping. patient reports prior to COVID he was swimming 2-3x/week at [facility] and would like to get back to that.      Prior Functioning/Environment Level of Independence: Independent with assistive device(s)        Comments: has been using RW and rollator at home; reports he manages his own medicines and bills; recent supervision for iADL such as grocery shopping since recent d/c home        OT Problem List: Decreased strength;Decreased range of motion;Decreased activity tolerance;Impaired balance (sitting and/or standing);Decreased cognition;Decreased safety awareness;Decreased knowledge of use of DME or AE;Cardiopulmonary status limiting activity      OT Treatment/Interventions: Self-care/ADL training;Therapeutic exercise;Energy conservation;DME and/or AE instruction;Therapeutic activities;Cognitive remediation/compensation;Patient/family education;Balance training    OT Goals(Current goals can be found in the care plan section) Acute Rehab OT Goals Patient Stated Goal: go home if able OT Goal Formulation: With patient Time For Goal Achievement: 03/31/20 Potential to Achieve Goals: Good  OT Frequency: Min 2X/week   Barriers to D/C:            Co-evaluation              AM-PAC OT "6 Clicks" Daily Activity  Outcome Measure Help from another person eating meals?: None Help from another person taking care of personal grooming?: A Little Help from another person toileting, which includes using toliet, bedpan, or urinal?: A Lot Help from another person bathing (including washing, rinsing, drying)?: A Lot Help from another person to put on and taking off regular upper body clothing?: A Little Help from another person to put on and  taking off regular lower body clothing?: A Lot 6 Click Score: 16   End of Session Equipment Utilized During Treatment: Gait belt;Rolling walker Nurse Communication: Mobility status  Activity Tolerance: Patient tolerated treatment well Patient left: in chair;with call bell/phone within reach;with chair alarm set  OT Visit Diagnosis: History of falling (Z91.81);Muscle weakness (generalized) (M62.81);Unsteadiness on feet (R26.81);Other symptoms and signs involving cognitive function                Time: 4718-5501 OT Time Calculation (min): 30 min Charges:  OT General Charges $OT Visit: 1 Visit OT Evaluation $OT Eval Moderate Complexity: 1 Mod OT Treatments $Self Care/Home Management : 8-22 mins  Lou Cal, OT Acute Rehabilitation Services Pager [phone] Office [phone]   Raymondo Band 03/17/2020, 1:17 PM

## 2020-03-17 NOTE — Plan of Care (Signed)
  Problem: Education: Goal: Knowledge of General Education information will improve Description: Including pain rating scale, medication(s)/side effects and non-pharmacologic comfort measures Outcome: Progressing   Problem: Health Behavior/Discharge Planning: Goal: Ability to manage health-related needs will improve Outcome: Progressing   Problem: Clinical Measurements: Goal: Ability to maintain clinical measurements within normal limits will improve Outcome: Progressing Goal: Will remain free from infection Outcome: Progressing Goal: Diagnostic test results will improve Outcome: Progressing Goal: Respiratory complications will improve Outcome: Progressing Goal: Cardiovascular complication will be avoided Outcome: Progressing   Problem: Activity: Goal: Risk for activity intolerance will decrease Outcome: Progressing   Problem: Nutrition: Goal: Adequate nutrition will be maintained Outcome: Progressing   Problem: Coping: Goal: Level of anxiety will decrease Outcome: Progressing   Problem: Pain Managment: Goal: General experience of comfort will improve Outcome: Progressing   Problem: Elimination: Goal: Will not experience complications related to bowel motility Outcome: Progressing Goal: Will not experience complications related to urinary retention Outcome: Progressing   Problem: Safety: Goal: Ability to remain free from injury will improve Outcome: Progressing   Problem: Skin Integrity: Goal: Risk for impaired skin integrity will decrease Outcome: Progressing   Problem: Education: Goal: Ability to demonstrate management of disease process will improve Outcome: Progressing Goal: Ability to verbalize understanding of medication therapies will improve Outcome: Progressing Goal: Individualized Educational Video(s) Outcome: Progressing   Problem: Activity: Goal: Capacity to carry out activities will improve Outcome: Progressing   Problem: Cardiac: Goal:  Ability to achieve and maintain adequate cardiopulmonary perfusion will improve Outcome: Progressing

## 2020-03-17 NOTE — ED Notes (Signed)
admit Provider at bedside. 

## 2020-03-18 ENCOUNTER — Other Ambulatory Visit: Payer: Self-pay | Admitting: *Deleted

## 2020-03-18 DIAGNOSIS — I4891 Unspecified atrial fibrillation: Secondary | ICD-10-CM

## 2020-03-18 DIAGNOSIS — W19XXXA Unspecified fall, initial encounter: Secondary | ICD-10-CM

## 2020-03-18 DIAGNOSIS — Y92009 Unspecified place in unspecified non-institutional (private) residence as the place of occurrence of the external cause: Secondary | ICD-10-CM | POA: Diagnosis not present

## 2020-03-18 LAB — BASIC METABOLIC PANEL
Anion gap: 9 (ref 5–15)
BUN: 18 mg/dL (ref 8–23)
CO2: 23 mmol/L (ref 22–32)
Calcium: 8.8 mg/dL — ABNORMAL LOW (ref 8.9–10.3)
Chloride: 99 mmol/L (ref 98–111)
Creatinine, Ser: 1.2 mg/dL (ref 0.61–1.24)
GFR, Estimated: >60 mL/min (ref >60)
Glucose, Bld: 105 mg/dL — ABNORMAL HIGH (ref 70–99)
Potassium: 2.9 mmol/L — ABNORMAL LOW (ref 3.5–5.1)
Sodium: 131 mmol/L — ABNORMAL LOW (ref 135–145)

## 2020-03-18 MED ORDER — POTASSIUM CHLORIDE CRYS ER 20 MEQ PO TBCR
30.0000 meq | EXTENDED_RELEASE_TABLET | ORAL | Status: AC
  Administered 2020-03-18 (×3): 30 meq via ORAL
  Filled 2020-03-18 (×3): qty 1

## 2020-03-18 NOTE — Patient Outreach (Signed)
[facility]) Care Management  03/18/2020  Darryl Diaz Jul 25, 1940 [id number]   Telephone Assessment-Unsuccessful  RN completed a 2nd outreach attempt that was unsuccessful. RN able to leave a HIPAA approved voice message requesting a call back.  Will rescheduled another outreach call over the next week for pending [facility] services.  [name], RN Care Management Coordinator [place] Office [phone]

## 2020-03-18 NOTE — Progress Notes (Addendum)
PROGRESS NOTE    Darryl Diaz  RUE:[id number] DOB: 05/12/1940 DOA: 03/16/2020 PCP: [name], MD    Brief Narrative:  Darryl Diaz is a 79 year old male with past medical history significant for atrial fibrillation on anticoagulation, upper GI bleed secondary to Mallory-Weiss tear, OSA, CAD status post cardiac arrest, alcohol dependence who presents from home following fall.  Patient initially activated EMS in which they returned him to his chair.  Following day, he was unable to move with severe lower back pain in which he activated EMS once again.  He also reported episode of diarrhea after eating old food.  Denies blood in the stool.  No nausea/vomiting.  Patient recently discharged by the cardiology service following DCCV, started flecainide (? Compliance), and subsequently started on amiodarone.  Patient was discharged to inpatient rehabilitation from 10/29 through 03/11/2020.  In the ED, patient was noted to be in A. fib with RVR following subsequent recurrent falls at home.  Cardiology was consulted, started on amiodarone drip.  Hospitalist service consulted for further evaluation and treatment.   Assessment & Plan:   Principal Problem:   Fall at home, initial encounter Active Problems:   Hypertension   Hyperlipidemia   Chronic diastolic congestive heart failure (HCC)   Atrial fibrillation with RVR (HCC)   Yeast infection   Diarrhea   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HCC)   Mechanical fall Patient presenting after recurrent mechanical falls at home.  Recently discharged from [facility] on 03/11/2020.  Seen by PT/OT with recommendations of ALF placement, but has declined.  Anticipate discharge home with home health PT/OT/RN/aide unless he changes his mind.  Updated patient's sister, [name] via telephone this afternoon, she does not plan on staying with him much as she is unable to assist physically.  Gastroenteritis secondary to Salmonella Patient reports  several loose stools after eating contaminated food.  GI PCR positive for Salmonella. --Continue ciprofloxacin 500 mg p.o. twice daily, plan 14-day course  Atrial fibrillation with RVR Recently discharged by cardiology, failed DCCV.  Started on amiodarone drip on admission by cardiology. --Amiodarone 400mg  PO BID --Eliquis 5 mg p.o. twice daily --Continue monitor on telemetry  Hypokalemia Potassium 2.9, will replete. --Repeat electrolytes in a.m. to include magnesium  Oral candidiasis --Continue nystatin swish and swallows --Fluconazole 100mg  IV q24h  Essential hypertension Chronic diastolic congestive heart failure Home regimen includes furosemide 40 mg p.o. daily as needed.   GERD: Continue PPI  Hyperlipidemia: Atorvastatin 20 mg p.o. daily  Depression/anxiety: Lexapro 10 mg p.o. daily  Iron deficiency anemia: Ferrous sulfate 325 mg p.o. twice daily  BPH: Continue tamsulosin 0.4 mg p.o. daily  EtOH use disorder: Counseled on need for complete cessation. --CIWAA protocol with symptom triggered Ativan --Multivitamin, thiamine, folic acid   DVT prophylaxis: Eliquis Code Status: Full code Family Communication: None present at bedside this morning; updated patient's Sister [name] via telephone this afternoon.  Disposition Plan:  Status is: Inpatient  Remains inpatient appropriate because:Persistent severe electrolyte disturbances, Unsafe d/c plan, IV treatments appropriate due to intensity of illness or inability to take PO and Inpatient level of care appropriate due to severity of illness   Dispo: The patient is from: Home              Anticipated d/c is to: Home              Anticipated d/c date is: 1 day              Patient currently  is not medically stable to d/c.   Consultants:   Cardiology  Procedures:   None  Antimicrobials:   None   Subjective: Patient seen and examined bedside, resting comfortably.  No complaints this morning.  Patient declines  ALF placement as he states is currently [facility] waiting list for [facility] and [facility].  Patient states that he will be going home with his sister to be living with him until his son arrives from [place] who will be here until Thanksgiving.  No other questions or concerns at this time.  Denies headache, no visual changes, no chest pain, palpitations, no shortness of breath, no abdominal pain.  No acute events overnight per nursing staff.  Objective: Vitals:   03/18/20 0323 03/18/20 0500 03/18/20 0737 03/18/20 0801  BP: 123/62   (!) 122/57  Pulse: 66   70  Resp: 18   16  Temp: 97.7 F (36.5 C)   (!) 97.4 F (36.3 C)  TempSrc: Oral   Oral  SpO2: 97%  92%   Weight:  98.5 kg    Height:        Intake/Output Summary (Last 24 hours) at 03/18/2020 1319 Last data filed at 03/17/2020 1938 Gross per 24 hour  Intake 490.07 ml  Output --  Net 490.07 ml   Filed Weights   03/16/20 0901 03/18/20 0500  Weight: 102.5 kg 98.5 kg    Examination:  General exam: Appears calm and comfortable  Respiratory system: Clear to auscultation. Respiratory effort normal.  Oxygenating well on room air. Cardiovascular system: S1 & S2 heard, RRR. No JVD, murmurs, rubs, gallops or clicks. No pedal edema. Gastrointestinal system: Abdomen is nondistended, soft and nontender. No organomegaly or masses felt. Normal bowel sounds heard. Central nervous system: Alert and oriented. No focal neurological deficits. Extremities: Symmetric 5 x 5 power. Skin: No rashes, lesions or ulcers Psychiatry: Judgement and insight appear poor. Mood & affect appropriate.     Data Reviewed: I have personally reviewed following labs and imaging studies  CBC: Recent Labs  Lab 03/16/20 0508 03/17/20 0957  WBC 11.5* 8.7  NEUTROABS 9.9* 7.3  HGB 11.2* 11.1*  HCT 33.8* 33.9*  MCV 94.7 94.2  PLT 212 510   Basic Metabolic Panel: Recent Labs  Lab 03/16/20 0508 03/18/20 0019  NA 126* 131*  K 3.5 2.9*  CL 91* 99  CO2 23 23   GLUCOSE 117* 105*  BUN 14 18  CREATININE 1.15 1.20  CALCIUM 8.5* 8.8*   GFR: Estimated Creatinine Clearance: 61.7 mL/min (by C-G formula based on SCr of 1.2 mg/dL). Liver Function Tests: Recent Labs  Lab 03/16/20 0508  AST 32  ALT 15  ALKPHOS 85  BILITOT 1.2  PROT 6.1*  ALBUMIN 3.3*   No results for input(s): LIPASE, AMYLASE in the last 168 hours. No results for input(s): AMMONIA in the last 168 hours. Coagulation Profile: Recent Labs  Lab 03/16/20 0508  INR 1.3*   Cardiac Enzymes: No results for input(s): CKTOTAL, CKMB, CKMBINDEX, TROPONINI in the last 168 hours. BNP (last 3 results) No results for input(s): PROBNP in the last 8760 hours. HbA1C: No results for input(s): HGBA1C in the last 72 hours. CBG: No results for input(s): GLUCAP in the last 168 hours. Lipid Profile: No results for input(s): CHOL, HDL, LDLCALC, TRIG, CHOLHDL, LDLDIRECT in the last 72 hours. Thyroid Function Tests: No results for input(s): TSH, T4TOTAL, FREET4, T3FREE, THYROIDAB in the last 72 hours. Anemia Panel: No results for input(s): VITAMINB12, FOLATE, FERRITIN, TIBC,  IRON, RETICCTPCT in the last 72 hours. Sepsis Labs: No results for input(s): PROCALCITON, LATICACIDVEN in the last 168 hours.  Recent Results (from the past 240 hour(s))  Respiratory Panel by RT PCR (Flu A&B, Covid) - Nasopharyngeal Swab     Status: None   Collection Time: 03/16/20  6:34 AM   Specimen: Nasopharyngeal Swab  Result Value Ref Range Status   SARS Coronavirus 2 by RT PCR NEGATIVE NEGATIVE Final    Comment: (NOTE) SARS-CoV-2 target nucleic acids are NOT DETECTED.  The SARS-CoV-2 RNA is generally detectable in upper respiratoy specimens during the acute phase of infection. The lowest concentration of SARS-CoV-2 viral copies this assay can detect is 131 copies/mL. A negative result does not preclude SARS-Cov-2 infection and should not be used as the sole basis for treatment or other patient management  decisions. A negative result may occur with  improper specimen collection/handling, submission of specimen other than nasopharyngeal swab, presence of viral mutation(s) within the areas targeted by this assay, and inadequate number of viral copies (<131 copies/mL). A negative result must be combined with clinical observations, patient history, and epidemiological information. The expected result is Negative.  Fact Sheet for Patients:  PinkCheek.be  Fact Sheet for Healthcare Providers:  GravelBags.it  This test is no t yet approved or cleared by the [place] FDA and  has been authorized for detection and/or diagnosis of SARS-CoV-2 by FDA under an Emergency Use Authorization (EUA). This EUA will remain  in effect (meaning this test can be used) for the duration of the COVID-19 declaration under Section 564(b)(1) of the Act, 21 U.S.C. section 360bbb-3(b)(1), unless the authorization is terminated or revoked sooner.     Influenza A by PCR NEGATIVE NEGATIVE Final   Influenza B by PCR NEGATIVE NEGATIVE Final    Comment: (NOTE) The Xpert Xpress SARS-CoV-2/FLU/RSV assay is intended as an aid in  the diagnosis of influenza from Nasopharyngeal swab specimens and  should not be used as a sole basis for treatment. Nasal washings and  aspirates are unacceptable for Xpert Xpress SARS-CoV-2/FLU/RSV  testing.  Fact Sheet for Patients: PinkCheek.be  Fact Sheet for Healthcare Providers: GravelBags.it  This test is not yet approved or cleared by the [place] FDA and  has been authorized for detection and/or diagnosis of SARS-CoV-2 by  FDA under an Emergency Use Authorization (EUA). This EUA will remain  in effect (meaning this test can be used) for the duration of the  Covid-19 declaration under Section 564(b)(1) of the Act, 21  U.S.C. section 360bbb-3(b)(1), unless the  authorization is  terminated or revoked. Performed at [facility], [place] [address], [place], [place] [address]   Gastrointestinal Panel by PCR , Stool     Status: Abnormal   Collection Time: 03/16/20  9:06 AM   Specimen: Stool  Result Value Ref Range Status   Campylobacter species NOT DETECTED NOT DETECTED Final   Plesimonas shigelloides NOT DETECTED NOT DETECTED Final   Salmonella species DETECTED (A) NOT DETECTED Final    Comment: RESULT CALLED TO, READ BACK BY AND VERIFIED WITH: MAGGIE BARBER AT 1351 03/16/20 SDR    Yersinia enterocolitica NOT DETECTED NOT DETECTED Final   Vibrio species NOT DETECTED NOT DETECTED Final   Vibrio cholerae NOT DETECTED NOT DETECTED Final   Enteroaggregative E coli (EAEC) NOT DETECTED NOT DETECTED Final   Enteropathogenic E coli (EPEC) NOT DETECTED NOT DETECTED Final   Enterotoxigenic E coli (ETEC) NOT DETECTED NOT DETECTED Final   Shiga like  toxin producing E coli (STEC) NOT DETECTED NOT DETECTED Final   Shigella/Enteroinvasive E coli (EIEC) NOT DETECTED NOT DETECTED Final   Cryptosporidium NOT DETECTED NOT DETECTED Final   Cyclospora cayetanensis NOT DETECTED NOT DETECTED Final   Entamoeba histolytica NOT DETECTED NOT DETECTED Final   Giardia lamblia NOT DETECTED NOT DETECTED Final   Adenovirus F40/41 NOT DETECTED NOT DETECTED Final   Astrovirus NOT DETECTED NOT DETECTED Final   Norovirus GI/GII NOT DETECTED NOT DETECTED Final   Rotavirus A NOT DETECTED NOT DETECTED Final   Sapovirus (I, II, IV, and V) NOT DETECTED NOT DETECTED Final    Comment: Performed at [facility], [place] [place], [place] [address]  MRSA PCR Screening     Status: None   Collection Time: 03/17/20  3:00 PM   Specimen: Nasal Mucosa; Nasopharyngeal  Result Value Ref Range Status   MRSA by PCR NEGATIVE NEGATIVE Final    Comment:        The GeneXpert MRSA Assay (FDA approved for NASAL specimens only), is one component of a comprehensive MRSA  colonization surveillance program. It is not intended to diagnose MRSA infection nor to guide or monitor treatment for MRSA infections. Performed at [facility], [place] [address], [place], [place] [address]          Radiology Studies: No results found.      Scheduled Meds: . amiodarone  400 mg Oral BID  . apixaban  5 mg Oral BID  . atorvastatin  20 mg Oral Daily  . ciprofloxacin  500 mg Oral BID  . escitalopram  10 mg Oral Daily  . ferrous sulfate  325 mg Oral BID WC  . folic acid  1 mg Oral Daily  . Gerhardt's butt cream   Topical QID  . loratadine  10 mg Oral Daily  . multivitamin with minerals  1 tablet Oral Q breakfast  . nystatin  5 mL Oral QID  . pantoprazole  40 mg Oral Daily  . potassium chloride  30 mEq Oral Q3H  . saccharomyces boulardii  250 mg Oral BID  . tamsulosin  0.4 mg Oral QPM  . thiamine  100 mg Oral Daily   Or  . thiamine  100 mg Intravenous Daily  . traZODone  50 mg Oral QHS   Continuous Infusions: . fluconazole (DIFLUCAN) IV 100 mg (03/18/20 0734)     LOS: 1 day    Time spent: 38 minutes spent on chart review, discussion with nursing staff, consultants, updating family and interview/physical exam; more than 50% of that time was spent in counseling and/or coordination of care.    Adlean Hardeman J [place] DO [facility] Available via Epic secure chat 7am-7pm After these hours, please refer to coverage provider listed on [email or web address] 03/18/2020, 1:19 PM

## 2020-03-18 NOTE — Progress Notes (Signed)
Physical Therapy Treatment Patient Details Name: Darryl Diaz MRN: [id number] DOB: [birth year] Today's Date: 03/18/2020    History of Present Illness 79yo male with recent admission 10/16 to 10/29 due to syncope, went to CIR then home. Was doing well until he fell at home, called EMS who got him up but he later called them again due to severe pain and RLE radiculopathy. Found to be in A-fib with RVR in the ED, also with diarrhea and groin rash. 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    PT Comments    Pt participated in session with constant redirection to participate in session; pt impulsive with poor safety and deficit awareness; pt able to follow commands but unable to full recall the next trial; pt requiring assist for sit<>stand transfer due to increase posterior lean; pt will benefit from skilled PT to address deficits in balance, strength, coordination, gait, safety and endurance to maximize independence with functional mobility prior to discharge   VSS during sessino    Follow Up Recommendations  Home health PT;Supervision/Assistance - 24 hour;Other (comment) (if he has 24 hour assist/supervision home if not SNF vs ALF)     Equipment Recommendations  Rolling walker with 5" wheels;3in1 (PT)    Recommendations for Other Services       Precautions / Restrictions Precautions Precautions: Fall Precaution Comments: hx of falls, mild impulsivity, watch HR Restrictions Weight Bearing Restrictions: No    Mobility  Bed Mobility Overal bed mobility: Needs Assistance Bed Mobility: Supine to Sit;Sit to Supine     Supine to sit: Min guard;HOB elevated Sit to supine: Supervision   General bed mobility comments: for safety, lines; HOB elevated   Transfers Overall transfer level: Needs assistance Equipment used: Rolling walker (2 wheeled) Transfers: Sit  to/from [facility] Sit to Stand: Min assist         General transfer comment: use of momentum and boosting assist to rise to standing; steadying assist and min VCs for safe RW use throughout and cueing to scot to EOB, pt with posterior bias with standing; performed 10 trials with patient able to recall scooting forward but continued to require assist with stabilization of RW and assist due to increased posterior lean  Ambulation/Gait                 Stairs             Wheelchair Mobility    Modified Rankin (Stroke Patients Only)       Balance Overall balance assessment: Needs assistance;History of Falls         Standing balance support: Bilateral upper extremity supported Standing balance-Leahy Scale: Poor Standing balance comment: reliant on UE support, posterior bias                            Cognition Arousal/Alertness: Awake/alert Behavior During Therapy: WFL for tasks assessed/performed Overall Cognitive Status: Impaired/Different from baseline Area of Impairment: Safety/judgement;Problem solving;Awareness                   Current Attention Level: Selective Memory: Decreased short-term memory Following Commands: Follows multi-step commands inconsistently Safety/Judgement: Decreased awareness of safety;Decreased awareness of deficits Awareness: Emergent Problem Solving: Slow processing;Requires verbal cues        Exercises      General Comments  Pertinent Vitals/Pain Pain Assessment: No/denies pain    Home Living                      Prior Function            PT Goals (current goals can now be found in the care plan section) Acute Rehab PT Goals Patient Stated Goal: go home if able PT Goal Formulation: With patient Time For Goal Achievement: 03/30/20 Potential to Achieve Goals: Fair Progress towards PT goals: Progressing toward goals    Frequency    Min 3X/week      PT  Plan Current plan remains appropriate    Co-evaluation              AM-PAC PT "6 Clicks" Mobility   Outcome Measure  Help needed turning from your back to your side while in a flat bed without using bedrails?: None Help needed moving from lying on your back to sitting on the side of a flat bed without using bedrails?: A Little Help needed moving to and from a bed to a chair (including a wheelchair)?: A Little Help needed standing up from a chair using your arms (e.g., wheelchair or bedside chair)?: A Lot Help needed to walk in hospital room?: A Little Help needed climbing 3-5 steps with a railing? : A Lot 6 Click Score: 17    End of Session Equipment Utilized During Treatment: Gait belt Activity Tolerance: Patient tolerated treatment well Patient left: in bed;with call bell/phone within reach Nurse Communication: Mobility status PT Visit Diagnosis: History of falling (Z91.81);Muscle weakness (generalized) (M62.81);Difficulty in walking, not elsewhere classified (R26.2);Unsteadiness on feet (R26.81);Repeated falls (R29.6)     Time: 3729-0211 PT Time Calculation (min) (ACUTE ONLY): 23 min  Charges:  $Therapeutic Activity: 23-37 mins                     Lyanne Co, DPT Acute Rehabilitation Services [phone]   [name] 03/18/2020, 12:58 PM

## 2020-03-18 NOTE — TOC Initial Note (Signed)
Transition of Care [facility]) - Initial/Assessment Note    Patient Details  Name: Darryl Diaz MRN: [id number] Date of Birth: 1940-12-22  Transition of Care [facility]) CM/SW Contact:    Zenon Mayo, RN Phone Number: 03/18/2020, 4:26 PM  Clinical Narrative:                 NCM spoke with patient, he states he will be home alone, his sister teaches in [place].  NCM asked if he had 24 hr care , he states no.  NCM asked if he wanted to go to SNF , he states no he wants to go home with [facility]. He is active with [facility] for HHPT/HHOT, will need resumption orders may need Education officer, museum.  NCM spoke with Thurmond Butts with [facility] services for private duty aides.  Patient is interested in this program also, Thurmond Butts is to call to discuss with patient to get set up with maybe 24 hr aide care services.   Expected Discharge Plan: Skilled Nursing Facility Barriers to Discharge: Continued Medical Work up   Patient Goals and CMS Choice Patient states their goals for this hospitalization and ongoing recovery are:: get better CMS Medicare.gov Compare Post Acute Care list provided to:: Patient Choice offered to / list presented to : Patient  Expected Discharge Plan and Services Expected Discharge Plan: [place]   Discharge Planning Services: CM Consult Post Acute Care Choice: [place] arrangements for the past 2 months: [place]                   DME Agency: NA       HH Arranged: PT, OT [place] Agency: [facility] Research scientist (medical) for aide private duty) Date [place]: 03/18/20 Time [place]: [place] Representative spoke with at [place]: Clearance Coots with [facility] services  Prior Living Arrangements/Services Living arrangements for the past 2 months: Single Family Home Lives with:: Self Patient language and need for interpreter reviewed:: Yes Do you feel safe going back to the place where you live?: Yes      Need  for Family Participation in Patient Care: Yes (Comment) Care giver support system in place?: No (comment)   Criminal Activity/Legal Involvement Pertinent to Current Situation/Hospitalization: No - Comment as needed  Activities of Daily Living Home Assistive Devices/Equipment: Walker (specify type) ADL Screening (condition at time of admission) Patient's cognitive ability adequate to safely complete daily activities?: Yes Is the patient deaf or have difficulty hearing?: No Does the patient have difficulty seeing, even when wearing glasses/contacts?: No Does the patient have difficulty concentrating, remembering, or making decisions?: No Patient able to express need for assistance with ADLs?: Yes Does the patient have difficulty dressing or bathing?: Yes Independently performs ADLs?: Yes (appropriate for developmental age) Communication: Independent Dressing (OT): Independent Is this a change from baseline?: Pre-admission baseline Grooming: Independent Is this a change from baseline?: Pre-admission baseline Feeding: Independent Is this a change from baseline?: Pre-admission baseline Toileting: Independent Is this a change from baseline?: Pre-admission baseline In/Out Bed: Independent Is this a change from baseline?: Pre-admission baseline Walks in Home: Independent with device (comment) Does the patient have difficulty walking or climbing stairs?: Yes Weakness of Legs: Both Weakness of Arms/Hands: None  Permission Sought/Granted                  Emotional Assessment Appearance:: Appears stated age Attitude/Demeanor/Rapport: Engaged Affect (typically observed): Appropriate Orientation: : Oriented to Self, Oriented to  Place, Oriented to  Time, Oriented to Situation Alcohol / Substance Use: Not Applicable Psych Involvement: No (comment)  Admission diagnosis:  Atrial fibrillation with rapid ventricular response (HCC) [I48.91] Atrial fibrillation with RVR (HCC)  [I48.91] Patient Active Problem List   Diagnosis Date Noted  . 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[place]) 03/17/2020  . Yeast infection 03/16/2020  . Diarrhea 03/16/2020  . 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 ([place])   . Allergies   . Anemia, chronic disease   . Chronic diastolic congestive heart failure ([place])   . History of hypertension   . Atrial fibrillation with RVR ([place])   . Debility   . Syncope and collapse 02/22/2020  . AKI (acute kidney injury) ([place]) 02/10/2020  . Unspecified protein-calorie malnutrition ([place]) 02/10/2020  . Weakness 02/06/2020  . Persistent atrial fibrillation with rapid ventricular response ([place]) 02/05/2020  . 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 Bradycardia 04/20/2019  . Atrial fibrillation with rapid ventricular response ([place])   . GI bleed 02/25/2019  . Fall at home, initial encounter 02/25/2019  . Bilateral leg edema 02/25/2019  . Hyponatremia 02/25/2019  . Acute posthemorrhagic anemia 08/11/2018  . Normochromic normocytic anemia 08/11/2018  . Hx of adenomatous colonic polyps 08/10/2017  . PAF (paroxysmal atrial fibrillation) ([place]) 02/05/2017  . Dislocation of hip, posterior, left, closed ([place]) 04/30/2016  . Hip fracture ([place]) 03/30/2016  . Hypertension 03/30/2016  . Hyperlipidemia 03/30/2016  . GERD (gastroesophageal reflux disease) 03/30/2016  . Osteoarthritis 03/30/2016  . Depression 03/30/2016  . Rhabdomyolysis 03/30/2016  . Leukocytosis 03/30/2016  . Pressure injury of skin 03/30/2016  . Closed fracture of left hip ([place])    PCP:  Tisovec, [name], MD Pharmacy:   [facility] ([facility]), [place] - [address] [place] ([place]) [place] [address] Phone: [phone] Fax: [phone]     Social Determinants of Health (SDOH) Interventions    Readmission Risk Interventions Readmission Risk Prevention Plan 03/18/2020 03/05/2020 04/23/2019   Transportation Screening Complete Complete Complete  PCP or Specialist Appt within 5-7 Days - - Complete  Home Care Screening - - Complete  Medication Review (RN CM) - - Complete  HRI or Home Care Consult Complete Complete -  Social Work Consult for [place] Planning/Counseling Complete Complete -  Palliative Care Screening Not Applicable Not Applicable -  Medication Review Press photographer) Complete Complete -  Some recent data might be hidden

## 2020-03-18 NOTE — Plan of Care (Signed)
  Problem: Education: Goal: Knowledge of General Education information will improve Description: Including pain rating scale, medication(s)/side effects and non-pharmacologic comfort measures 03/18/2020 0723 by Shanon Ace, RN Outcome: Progressing 03/17/2020 1821 by Shanon Ace, RN Outcome: Progressing   Problem: Health Behavior/Discharge Planning: Goal: Ability to manage health-related needs will improve 03/18/2020 0723 by Shanon Ace, RN Outcome: Progressing 03/17/2020 1821 by Shanon Ace, RN Outcome: Progressing   Problem: Clinical Measurements: Goal: Ability to maintain clinical measurements within normal limits will improve 03/18/2020 0723 by Shanon Ace, RN Outcome: Progressing 03/17/2020 1821 by Shanon Ace, RN Outcome: Progressing Goal: Will remain free from infection 03/18/2020 0723 by Shanon Ace, RN Outcome: Progressing 03/17/2020 1821 by Shanon Ace, RN Outcome: Progressing Goal: Diagnostic test results will improve 03/18/2020 0723 by Shanon Ace, RN Outcome: Progressing 03/17/2020 1821 by Shanon Ace, RN Outcome: Progressing Goal: Respiratory complications will improve 03/18/2020 0723 by Shanon Ace, RN Outcome: Progressing 03/17/2020 1821 by Shanon Ace, RN Outcome: Progressing Goal: Cardiovascular complication will be avoided 03/18/2020 0723 by Shanon Ace, RN Outcome: Progressing 03/17/2020 1821 by Shanon Ace, RN Outcome: Progressing   Problem: Activity: Goal: Risk for activity intolerance will decrease 03/18/2020 0723 by Shanon Ace, RN Outcome: Progressing 03/17/2020 1821 by Shanon Ace, RN Outcome: Progressing   Problem: Nutrition: Goal: Adequate nutrition will be maintained 03/18/2020 0723 by Shanon Ace, RN Outcome: Progressing 03/17/2020 1821 by Shanon Ace, RN Outcome: Progressing   Problem: Coping: Goal: Level of anxiety will decrease 03/18/2020 0723 by Shanon Ace, RN Outcome: Progressing 03/17/2020 1821  by Shanon Ace, RN Outcome: Progressing   Problem: Elimination: Goal: Will not experience complications related to bowel motility 03/18/2020 0723 by Shanon Ace, RN Outcome: Progressing 03/17/2020 1821 by Shanon Ace, RN Outcome: Progressing Goal: Will not experience complications related to urinary retention 03/18/2020 0723 by Shanon Ace, RN Outcome: Progressing 03/17/2020 1821 by Shanon Ace, RN Outcome: Progressing   Problem: Pain Managment: Goal: General experience of comfort will improve 03/18/2020 0723 by Shanon Ace, RN Outcome: Progressing 03/17/2020 1821 by Shanon Ace, RN Outcome: Progressing   Problem: Safety: Goal: Ability to remain free from injury will improve 03/18/2020 0723 by Shanon Ace, RN Outcome: Progressing 03/17/2020 1821 by Shanon Ace, RN Outcome: Progressing   Problem: Skin Integrity: Goal: Risk for impaired skin integrity will decrease 03/18/2020 0723 by Shanon Ace, RN Outcome: Progressing 03/17/2020 1821 by Shanon Ace, RN Outcome: Progressing   Problem: Education: Goal: Ability to demonstrate management of disease process will improve 03/18/2020 0723 by Shanon Ace, RN Outcome: Progressing 03/17/2020 1821 by Shanon Ace, RN Outcome: Progressing Goal: Ability to verbalize understanding of medication therapies will improve 03/18/2020 0723 by Shanon Ace, RN Outcome: Progressing 03/17/2020 1821 by Shanon Ace, RN Outcome: Progressing Goal: Individualized Educational Video(s) 03/18/2020 0723 by Shanon Ace, RN Outcome: Progressing 03/17/2020 1821 by Shanon Ace, RN Outcome: Progressing   Problem: Activity: Goal: Capacity to carry out activities will improve 03/18/2020 0723 by Shanon Ace, RN Outcome: Progressing 03/17/2020 1821 by Shanon Ace, RN Outcome: Progressing   Problem: Cardiac: Goal: Ability to achieve and maintain adequate cardiopulmonary perfusion will improve 03/18/2020 0723 by Shanon Ace, RN Outcome: Progressing 03/17/2020 1821 by Shanon Ace, RN Outcome: Progressing

## 2020-03-19 DIAGNOSIS — W19XXXA Unspecified fall, initial encounter: Secondary | ICD-10-CM | POA: Diagnosis not present

## 2020-03-19 DIAGNOSIS — Y92009 Unspecified place in unspecified non-institutional (private) residence as the place of occurrence of the external cause: Secondary | ICD-10-CM | POA: Diagnosis not present

## 2020-03-19 DIAGNOSIS — I4891 Unspecified atrial fibrillation: Secondary | ICD-10-CM | POA: Diagnosis not present

## 2020-03-19 LAB — BASIC METABOLIC PANEL
Anion gap: 7 (ref 5–15)
BUN: 17 mg/dL (ref 8–23)
CO2: 21 mmol/L — ABNORMAL LOW (ref 22–32)
Calcium: 8.8 mg/dL — ABNORMAL LOW (ref 8.9–10.3)
Chloride: 106 mmol/L (ref 98–111)
Creatinine, Ser: 1.32 mg/dL — ABNORMAL HIGH (ref 0.61–1.24)
GFR, Estimated: 55 mL/min — ABNORMAL LOW (ref >60)
Glucose, Bld: 108 mg/dL — ABNORMAL HIGH (ref 70–99)
Potassium: 3.9 mmol/L (ref 3.5–5.1)
Sodium: 134 mmol/L — ABNORMAL LOW (ref 135–145)

## 2020-03-19 LAB — MAGNESIUM: Magnesium: 1.5 mg/dL — ABNORMAL LOW (ref 1.7–2.4)

## 2020-03-19 MED ORDER — FLUCONAZOLE 100 MG PO TABS
100.0000 mg | ORAL_TABLET | Freq: Every day | ORAL | Status: DC
  Administered 2020-03-20: 100 mg via ORAL
  Filled 2020-03-19: qty 1

## 2020-03-19 MED ORDER — MAGNESIUM SULFATE 2 GM/50ML IV SOLN
2.0000 g | Freq: Once | INTRAVENOUS | Status: AC
  Administered 2020-03-19: 2 g via INTRAVENOUS
  Filled 2020-03-19: qty 50

## 2020-03-19 MED ORDER — SODIUM CHLORIDE 0.9 % IV BOLUS
500.0000 mL | Freq: Once | INTRAVENOUS | Status: AC
  Administered 2020-03-19: 500 mL via INTRAVENOUS

## 2020-03-19 NOTE — Progress Notes (Signed)
Occupational Therapy Treatment Patient Details Name: Darryl Diaz MRN: [id number] DOB: [birth year] Today's Date: 03/19/2020    History of present illness 79yo male with recent admission 10/16 to 10/29 due to syncope, went to CIR then home. Was doing well until he fell at home, called EMS who got him up but he later called them again due to severe pain and RLE radiculopathy. Found to be in A-fib with RVR in the ED, also with diarrhea and groin rash. 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   OT comments  Patient continues to make steady progress towards goals in skilled OT session. Patient's session encompassed functional mobility and continued education with regard to fall prevention and energy conservation. Pt remains able to verbalize all the strategies he needs to accomplish at home, but admitted he did not follow them this last admission and caused him to fall. Pt to have 24/7 assistance with [place] home care aide starting tomorrow and then son is moving in to provide assistance. Discharge remains appropriate, will continue to follow acutely.     Follow Up Recommendations  Supervision/Assistance - 24 hour;Home health OT;SNF    Equipment Recommendations  None recommended by OT    Recommendations for Other Services      Precautions / Restrictions Precautions Precautions: Fall Precaution Comments: hx of falls, mild impulsivity, watch HR Restrictions Weight Bearing Restrictions: No       Mobility Bed Mobility               General bed mobility comments: up in chair upon arrival  Transfers Overall transfer level: Needs assistance Equipment used: Rolling walker (2 wheeled)   Sit to Stand: Min assist         General transfer comment: able to state appropriate mechanics of transfers, but requries momentum and min extra time in order to come to standing  x2    Balance Overall balance assessment: Needs assistance;History of Falls Sitting-balance support: Feet unsupported Sitting balance-Leahy Scale: Good     Standing balance support: Bilateral upper extremity supported Standing balance-Leahy Scale: Poor Standing balance comment: reliant on UE support, posterior bias                           ADL either performed or assessed with clinical judgement   ADL Overall ADL's : Needs assistance/impaired     Grooming: Set up;Supervision/safety;Sitting                               Functional mobility during ADLs: Minimal assistance;Rolling walker General ADL Comments: pt mobilizing ain hallway with therapist after education with regard to falls and energy conservation, requiring minA for balance and min cues for safety     Vision       Perception     Praxis      Cognition Arousal/Alertness: Awake/alert Behavior During Therapy: WFL for tasks assessed/performed Overall Cognitive Status: Impaired/Different from baseline Area of Impairment: Safety/judgement;Problem solving;Awareness;Attention;Memory                   Current Attention Level: Selective Memory: Decreased short-term memory Following Commands: Follows multi-step commands inconsistently Safety/Judgement: Decreased awareness of safety;Decreased awareness of deficits Awareness: Emergent Problem Solving: Slow processing;Requires verbal cues General Comments: pt requiring min cues for safety, but overall following commands well,  can tell you everything you want to hear, hoping theres better follow through this time        Exercises     Shoulder Instructions       General Comments      Pertinent Vitals/ Pain       Pain Assessment: No/denies pain  Home Living                                          Prior Functioning/Environment              Frequency  Min 2X/week        Progress Toward Goals  OT  Goals(current goals can now be found in the care plan section)  Progress towards OT goals: Progressing toward goals  Acute Rehab OT Goals Patient Stated Goal: go home tomorrow OT Goal Formulation: With patient Time For Goal Achievement: 03/31/20 Potential to Achieve Goals: Good  Plan Discharge plan remains appropriate    Co-evaluation                 AM-PAC OT "6 Clicks" Daily Activity     Outcome Measure   Help from another person eating meals?: None Help from another person taking care of personal grooming?: A Little Help from another person toileting, which includes using toliet, bedpan, or urinal?: A Little Help from another person bathing (including washing, rinsing, drying)?: A Little Help from another person to put on and taking off regular upper body clothing?: A Little Help from another person to put on and taking off regular lower body clothing?: A Lot 6 Click Score: 18    End of Session Equipment Utilized During Treatment: Gait belt;Rolling walker  OT Visit Diagnosis: History of falling (Z91.81);Muscle weakness (generalized) (M62.81);Unsteadiness on feet (R26.81);Other symptoms and signs involving cognitive function   Activity Tolerance Patient tolerated treatment well   Patient Left in chair;with call bell/phone within reach;with chair alarm set   Nurse Communication Mobility status        Time: 8280-0349 OT Time Calculation (min): 27 min  Charges: OT General Charges $OT Visit: 1 Visit OT Treatments $Self Care/Home Management : 23-37 mins  [place]. Arnesia Vincelette, COTA/L Acute Rehabilitation Services [phone] Regino Ramirez 03/19/2020, 1:07 PM

## 2020-03-19 NOTE — Progress Notes (Signed)
PROGRESS NOTE    Darryl Diaz  ZOX:[id number] DOB: May 26, 1940 DOA: 03/16/2020 PCP: [name], MD    Brief Narrative:  Darryl Diaz is a 79 year old male with past medical history significant for atrial fibrillation on anticoagulation, upper GI bleed secondary to Mallory-Weiss tear, OSA, CAD status post cardiac arrest, alcohol dependence who presents from home following fall.  Patient initially activated EMS in which they returned him to his chair.  Following day, he was unable to move with severe lower back pain in which he activated EMS once again.  He also reported episode of diarrhea after eating old food.  Denies blood in the stool.  No nausea/vomiting.  Patient recently discharged by the cardiology service following DCCV, started flecainide (? Compliance), and subsequently started on amiodarone.  Patient was discharged to inpatient rehabilitation from 10/29 through 03/11/2020.  In the ED, patient was noted to be in A. fib with RVR following subsequent recurrent falls at home.  Cardiology was consulted, started on amiodarone drip.  Hospitalist service consulted for further evaluation and treatment.   Assessment & Plan:   Principal Problem:   Fall at home, initial encounter Active Problems:   Hypertension   Hyperlipidemia   Chronic diastolic congestive heart failure (HCC)   Atrial fibrillation with RVR (HCC)   Yeast infection   Diarrhea   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HCC)   Mechanical fall Patient presenting after recurrent mechanical falls at home.  Recently discharged from [facility] on 03/11/2020.  Seen by PT/OT with recommendations of ALF placement, but has declined.  Anticipate discharge home with home health PT/OT/RN/aide unless he changes his mind.  Updated patient's sister, [name] via telephone this afternoon, she does not plan on staying with him much as she is unable to assist physically.  Gastroenteritis secondary to Salmonella Patient reports  several loose stools after eating contaminated food.  GI PCR positive for Salmonella. --Continue ciprofloxacin 500 mg p.o. twice daily, plan 14-day course  Acute renal failure --Cr 1.15>1.20>1.32 --547mL NS bolus today  Atrial fibrillation with RVR Recently discharged by cardiology, failed DCCV.  Started on amiodarone drip on admission by cardiology. --Amiodarone 400mg  PO BID --Eliquis 5 mg p.o. twice daily --Continue monitor on telemetry  Hypokalemia Repleted, potassium 3.9 this morning. --Repeat electrolytes in a.m. to include magnesium  Hypomagnesemia Magnesium 1.5, will replete today. --Repeat magnesium level in a.m.  Oral candidiasis --Continue nystatin swish and swallows --Fluconazole 100mg  PO daily x 14d course  Essential hypertension Chronic diastolic congestive heart failure Home regimen includes furosemide 40 mg p.o. daily as needed.   GERD: Continue PPI  Hyperlipidemia: Atorvastatin 20 mg p.o. daily  Depression/anxiety: Lexapro 10 mg p.o. daily  Iron deficiency anemia: Ferrous sulfate 325 mg p.o. twice daily  BPH: Continue tamsulosin 0.4 mg p.o. daily  EtOH use disorder: Counseled on need for complete cessation. --CIWAA protocol with symptom triggered Ativan --Multivitamin, thiamine, folic acid   DVT prophylaxis: Eliquis Code Status: Full code Family Communication: None present at bedside this morning; updated patient's Sister [name] via telephone this afternoon.  Disposition Plan:  Status is: Inpatient  Remains inpatient appropriate because:Persistent severe electrolyte disturbances, Unsafe d/c plan, IV treatments appropriate due to intensity of illness or inability to take PO and Inpatient level of care appropriate due to severity of illness   Dispo: The patient is from: Home              Anticipated d/c is to: Home  Anticipated d/c date is: 1 day              Patient currently is not medically stable to d/c.   Consultants:    Cardiology  Procedures:   None  Antimicrobials:   None   Subjective: Patient seen and examined bedside, resting comfortably.  No complaints this morning.  Continues to decline SNF/ALF placement, home health will be set up tomorrow, family/son coming in from out of town and will not arrive until Tuesday or Wednesday next week.  No other questions or concerns at this time. Denies headache, no visual changes, no chest pain, palpitations, no shortness of breath, no abdominal pain.  No acute events overnight per nursing staff.  Objective: Vitals:   03/18/20 2351 03/19/20 0405 03/19/20 0727 03/19/20 1103  BP: 133/69  (!) 146/78 112/60  Pulse: 69  70 63  Resp: 18  20 15   Temp: 98.3 F (36.8 C)  97.8 F (36.6 C) (!) 97.4 F (36.3 C)  TempSrc: Oral  Oral Oral  SpO2: 97%  97% 96%  Weight:  100.2 kg    Height:        Intake/Output Summary (Last 24 hours) at 03/19/2020 1224 Last data filed at 03/19/2020 0905 Gross per 24 hour  Intake 310.25 ml  Output 200 ml  Net 110.25 ml   Filed Weights   03/16/20 0901 03/18/20 0500 03/19/20 0405  Weight: 102.5 kg 98.5 kg 100.2 kg    Examination:  General exam: Appears calm and comfortable  Respiratory system: Clear to auscultation. Respiratory effort normal.  Oxygenating well on room air. Cardiovascular system: S1 & S2 heard, RRR. No JVD, murmurs, rubs, gallops or clicks. No pedal edema. Gastrointestinal system: Abdomen is nondistended, soft and nontender. No organomegaly or masses felt. Normal bowel sounds heard. Central nervous system: Alert and oriented. No focal neurological deficits. Extremities: Symmetric 5 x 5 power. Skin: No rashes, lesions or ulcers Psychiatry: Judgement and insight appear poor. Mood & affect appropriate.     Data Reviewed: I have personally reviewed following labs and imaging studies  CBC: Recent Labs  Lab 03/16/20 0508 03/17/20 0957  WBC 11.5* 8.7  NEUTROABS 9.9* 7.3  HGB 11.2* 11.1*  HCT 33.8*  33.9*  MCV 94.7 94.2  PLT 212 962   Basic Metabolic Panel: Recent Labs  Lab 03/16/20 0508 03/18/20 0019 03/19/20 0024  NA 126* 131* 134*  K 3.5 2.9* 3.9  CL 91* 99 106  CO2 23 23 21*  GLUCOSE 117* 105* 108*  BUN 14 18 17   CREATININE 1.15 1.20 1.32*  CALCIUM 8.5* 8.8* 8.8*  MG  --   --  1.5*   GFR: Estimated Creatinine Clearance: 56.5 mL/min (A) (by C-G formula based on SCr of 1.32 mg/dL (H)). Liver Function Tests: Recent Labs  Lab 03/16/20 0508  AST 32  ALT 15  ALKPHOS 85  BILITOT 1.2  PROT 6.1*  ALBUMIN 3.3*   No results for input(s): LIPASE, AMYLASE in the last 168 hours. No results for input(s): AMMONIA in the last 168 hours. Coagulation Profile: Recent Labs  Lab 03/16/20 0508  INR 1.3*   Cardiac Enzymes: No results for input(s): CKTOTAL, CKMB, CKMBINDEX, TROPONINI in the last 168 hours. BNP (last 3 results) No results for input(s): PROBNP in the last 8760 hours. HbA1C: No results for input(s): HGBA1C in the last 72 hours. CBG: No results for input(s): GLUCAP in the last 168 hours. Lipid Profile: No results for input(s): CHOL, HDL, LDLCALC, TRIG, CHOLHDL, LDLDIRECT in  the last 72 hours. Thyroid Function Tests: No results for input(s): TSH, T4TOTAL, FREET4, T3FREE, THYROIDAB in the last 72 hours. Anemia Panel: No results for input(s): VITAMINB12, FOLATE, FERRITIN, TIBC, IRON, RETICCTPCT in the last 72 hours. Sepsis Labs: No results for input(s): PROCALCITON, LATICACIDVEN in the last 168 hours.  Recent Results (from the past 240 hour(s))  Respiratory Panel by RT PCR (Flu A&B, Covid) - Nasopharyngeal Swab     Status: None   Collection Time: 03/16/20  6:34 AM   Specimen: Nasopharyngeal Swab  Result Value Ref Range Status   SARS Coronavirus 2 by RT PCR NEGATIVE NEGATIVE Final    Comment: (NOTE) SARS-CoV-2 target nucleic acids are NOT DETECTED.  The SARS-CoV-2 RNA is generally detectable in upper respiratoy specimens during the acute phase of  infection. The lowest concentration of SARS-CoV-2 viral copies this assay can detect is 131 copies/mL. A negative result does not preclude SARS-Cov-2 infection and should not be used as the sole basis for treatment or other patient management decisions. A negative result may occur with  improper specimen collection/handling, submission of specimen other than nasopharyngeal swab, presence of viral mutation(s) within the areas targeted by this assay, and inadequate number of viral copies (<131 copies/mL). A negative result must be combined with clinical observations, patient history, and epidemiological information. The expected result is Negative.  Fact Sheet for Patients:  PinkCheek.be  Fact Sheet for Healthcare Providers:  GravelBags.it  This test is no t yet approved or cleared by the [place] FDA and  has been authorized for detection and/or diagnosis of SARS-CoV-2 by FDA under an Emergency Use Authorization (EUA). This EUA will remain  in effect (meaning this test can be used) for the duration of the COVID-19 declaration under Section 564(b)(1) of the Act, 21 U.S.C. section 360bbb-3(b)(1), unless the authorization is terminated or revoked sooner.     Influenza A by PCR NEGATIVE NEGATIVE Final   Influenza B by PCR NEGATIVE NEGATIVE Final    Comment: (NOTE) The Xpert Xpress SARS-CoV-2/FLU/RSV assay is intended as an aid in  the diagnosis of influenza from Nasopharyngeal swab specimens and  should not be used as a sole basis for treatment. Nasal washings and  aspirates are unacceptable for Xpert Xpress SARS-CoV-2/FLU/RSV  testing.  Fact Sheet for Patients: PinkCheek.be  Fact Sheet for Healthcare Providers: GravelBags.it  This test is not yet approved or cleared by the [place] FDA and  has been authorized for detection and/or diagnosis of SARS-CoV-2  by  FDA under an Emergency Use Authorization (EUA). This EUA will remain  in effect (meaning this test can be used) for the duration of the  Covid-19 declaration under Section 564(b)(1) of the Act, 21  U.S.C. section 360bbb-3(b)(1), unless the authorization is  terminated or revoked. Performed at [facility], [place] [address] [place], [place] [address]   Gastrointestinal Panel by PCR , Stool     Status: Abnormal   Collection Time: 03/16/20  9:06 AM   Specimen: Stool  Result Value Ref Range Status   Campylobacter species NOT DETECTED NOT DETECTED Final   Plesimonas shigelloides NOT DETECTED NOT DETECTED Final   Salmonella species DETECTED (A) NOT DETECTED Final    Comment: RESULT CALLED TO, READ BACK BY AND VERIFIED WITH: MAGGIE BARBER AT 0277 03/16/20 SDR    Yersinia enterocolitica NOT DETECTED NOT DETECTED Final   Vibrio species NOT DETECTED NOT DETECTED Final   Vibrio cholerae NOT DETECTED NOT DETECTED Final   Enteroaggregative E coli (EAEC)  NOT DETECTED NOT DETECTED Final   Enteropathogenic E coli (EPEC) NOT DETECTED NOT DETECTED Final   Enterotoxigenic E coli (ETEC) NOT DETECTED NOT DETECTED Final   Shiga like toxin producing E coli (STEC) NOT DETECTED NOT DETECTED Final   Shigella/Enteroinvasive E coli (EIEC) NOT DETECTED NOT DETECTED Final   Cryptosporidium NOT DETECTED NOT DETECTED Final   Cyclospora cayetanensis NOT DETECTED NOT DETECTED Final   Entamoeba histolytica NOT DETECTED NOT DETECTED Final   Giardia lamblia NOT DETECTED NOT DETECTED Final   Adenovirus F40/41 NOT DETECTED NOT DETECTED Final   Astrovirus NOT DETECTED NOT DETECTED Final   Norovirus GI/GII NOT DETECTED NOT DETECTED Final   Rotavirus A NOT DETECTED NOT DETECTED Final   Sapovirus (I, II, IV, and V) NOT DETECTED NOT DETECTED Final    Comment: Performed at [facility], [address] [place], [place] [address]  MRSA PCR Screening     Status: None   Collection Time: 03/17/20  3:00 PM    Specimen: Nasal Mucosa; Nasopharyngeal  Result Value Ref Range Status   MRSA by PCR NEGATIVE NEGATIVE Final    Comment:        The GeneXpert MRSA Assay (FDA approved for NASAL specimens only), is one component of a comprehensive MRSA colonization surveillance program. It is not intended to diagnose MRSA infection nor to guide or monitor treatment for MRSA infections. Performed at [facility], [place] [address], [place], [place] [address]          Radiology Studies: No results found.      Scheduled Meds: . amiodarone  400 mg Oral BID  . apixaban  5 mg Oral BID  . atorvastatin  20 mg Oral Daily  . ciprofloxacin  500 mg Oral BID  . escitalopram  10 mg Oral Daily  . ferrous sulfate  325 mg Oral BID WC  . folic acid  1 mg Oral Daily  . Gerhardt's butt cream   Topical QID  . loratadine  10 mg Oral Daily  . multivitamin with minerals  1 tablet Oral Q breakfast  . nystatin  5 mL Oral QID  . pantoprazole  40 mg Oral Daily  . saccharomyces boulardii  250 mg Oral BID  . tamsulosin  0.4 mg Oral QPM  . thiamine  100 mg Oral Daily  . traZODone  50 mg Oral QHS   Continuous Infusions: . fluconazole (DIFLUCAN) IV 100 mg (03/19/20 1030)     LOS: 2 days    Time spent: 35 minutes spent on chart review, discussion with nursing staff, consultants, updating family and interview/physical exam; more than 50% of that time was spent in counseling and/or coordination of care.    Shirell Struthers J [place] DO [facility] Available via Epic secure chat 7am-7pm After these hours, please refer to coverage provider listed on [email or web address] 03/19/2020, 12:24 PM

## 2020-03-20 ENCOUNTER — Ambulatory Visit (HOSPITAL_COMMUNITY): Payer: Medicare Other | Admitting: Physician Assistant

## 2020-03-20 ENCOUNTER — Telehealth: Payer: Self-pay

## 2020-03-20 DIAGNOSIS — A020 Salmonella enteritis: Secondary | ICD-10-CM | POA: Diagnosis present

## 2020-03-20 DIAGNOSIS — I4819 Other persistent atrial fibrillation: Secondary | ICD-10-CM | POA: Diagnosis not present

## 2020-03-20 DIAGNOSIS — I4891 Unspecified atrial fibrillation: Secondary | ICD-10-CM | POA: Diagnosis not present

## 2020-03-20 DIAGNOSIS — A029 Salmonella infection, unspecified: Secondary | ICD-10-CM | POA: Diagnosis present

## 2020-03-20 DIAGNOSIS — W19XXXA Unspecified fall, initial encounter: Secondary | ICD-10-CM | POA: Diagnosis not present

## 2020-03-20 DIAGNOSIS — Y92009 Unspecified place in unspecified non-institutional (private) residence as the place of occurrence of the external cause: Secondary | ICD-10-CM | POA: Diagnosis not present

## 2020-03-20 LAB — BASIC METABOLIC PANEL
Anion gap: 10 (ref 5–15)
BUN: 15 mg/dL (ref 8–23)
CO2: 19 mmol/L — ABNORMAL LOW (ref 22–32)
Calcium: 8.6 mg/dL — ABNORMAL LOW (ref 8.9–10.3)
Chloride: 104 mmol/L (ref 98–111)
Creatinine, Ser: 1.06 mg/dL (ref 0.61–1.24)
GFR, Estimated: >60 mL/min (ref >60)
Glucose, Bld: 99 mg/dL (ref 70–99)
Potassium: 3.8 mmol/L (ref 3.5–5.1)
Sodium: 133 mmol/L — ABNORMAL LOW (ref 135–145)

## 2020-03-20 LAB — MAGNESIUM: Magnesium: 1.6 mg/dL — ABNORMAL LOW (ref 1.7–2.4)

## 2020-03-20 MED ORDER — FLUCONAZOLE 100 MG PO TABS: 100.0000 mg | ORAL_TABLET | Freq: Every day | ORAL | 0 refills | Status: AC

## 2020-03-20 MED ORDER — CIPROFLOXACIN HCL 500 MG PO TABS: 500.0000 mg | ORAL_TABLET | Freq: Two times a day (BID) | ORAL | 0 refills | Status: AC

## 2020-03-20 MED ORDER — MAGNESIUM SULFATE 2 GM/50ML IV SOLN
2.0000 g | Freq: Once | INTRAVENOUS | Status: AC
  Administered 2020-03-20: 2 g via INTRAVENOUS
  Filled 2020-03-20: qty 50

## 2020-03-20 MED ORDER — AMIODARONE HCL 400 MG PO TABS: 400.0000 mg | ORAL_TABLET | Freq: Two times a day (BID) | ORAL | 0 refills | Status: DC

## 2020-03-20 NOTE — Progress Notes (Signed)
Patient discharge instructions with follow up appointments and medications reviewed with patient.  Patient verbalizes understanding of the instructions and given the opportunity to ask questions.  Written copy of discharge instructions given to patient. Patient via wheelchair to sister's waiting truck and assisted into the truck and buckeled in stable condition

## 2020-03-20 NOTE — Telephone Encounter (Signed)
Contacted patient to advise [facility] has investigate Salmonella outbreak and to notify he was exposed to [facility]I.  He said he understood and thanked me for calling to report situation.  Advised we will see in office 12.6.21 - Dr.Patel

## 2020-03-20 NOTE — TOC Transition Note (Signed)
Transition of Care [facility]) - CM/SW Discharge Note   Patient Details  Name: Darryl Diaz MRN: [id number] Date of Birth: 11-26-1940  [facility] of [facility] CM/SW Contact:  Zenon Mayo, RN Phone Number: 03/20/2020, 10:59 AM   Clinical Narrative:    Patient is for dc today, NCM notified [place] with [facility].  NCM spoke with Lattie Haw with [facility] services for private duty, she states patient will have 24 hr care at home.  Family will transport him home today at 2 pm.    Final next level of care: [place] Barriers to Discharge: No Barriers Identified   Patient Goals and CMS Choice Patient states their goals for this hospitalization and ongoing recovery are:: go home CMS Medicare.gov Compare Post Acute Care list provided to:: Patient Choice offered to / list presented to : Patient  Discharge Placement                       Discharge Plan and Services   Discharge Planning Services: CM Consult Post Acute Care Choice: Home Health            DME Agency: NA       HH Arranged: PT, OT [place] Agency: [facility] [place] ([place]) Date [place]: 03/18/20 Time [place]: 6759 Representative spoke with at [place]: [place] ([place]) Interventions     Readmission Risk Interventions Readmission Risk Prevention Plan 03/18/2020 03/05/2020 04/23/2019  Transportation Screening Complete Complete Complete  PCP or Specialist Appt within 5-7 Days - - Complete  Home Care Screening - - Complete  Medication Review (RN CM) - - Complete  HRI or Home Care Consult Complete Complete -  Social Work Consult for [place] Planning/Counseling Complete Complete -  Palliative Care Screening Not Applicable Not Applicable -  Medication Review Press photographer) Complete Complete -  Some recent data might be hidden

## 2020-03-20 NOTE — Discharge Instructions (Signed)
Atrial Fibrillation  Atrial fibrillation is a type of heartbeat that is irregular or fast. If you have this condition, your heart beats without any order. This makes it hard for your heart to pump blood in a normal way. Atrial fibrillation may come and go, or it may become a long-lasting problem. If this condition is not treated, it can put you at higher risk for stroke, heart failure, and other heart problems. What are the causes? This condition may be caused by diseases that damage the heart. They include:  High blood pressure.  Heart failure.  Heart valve disease.  Heart surgery. Other causes include:  Diabetes.  Thyroid disease.  Being overweight.  Kidney disease. Sometimes the cause is not known. What increases the risk? You are more likely to develop this condition if:  You are older.  You smoke.  You exercise often and very hard.  You have a family history of this condition.  You are a man.  You use drugs.  You drink a lot of alcohol.  You have lung conditions, such as emphysema, pneumonia, or COPD.  You have sleep apnea. What are the signs or symptoms? Common symptoms of this condition include:  A feeling that your heart is beating very fast.  Chest pain or discomfort.  Feeling short of breath.  Suddenly feeling light-headed or weak.  Getting tired easily during activity.  Fainting.  Sweating. In some cases, there are no symptoms. How is this treated? Treatment for this condition depends on underlying conditions and how you feel when you have atrial fibrillation. They include:  Medicines to: ? Prevent blood clots. ? Treat heart rate or heart rhythm problems.  Using devices, such as a pacemaker, to correct heart rhythm problems.  Doing surgery to remove the part of the heart that sends bad signals.  Closing an area where clots can form in the heart (left atrial appendage). In some cases, your doctor will treat other underlying  conditions. Follow these instructions at home: Medicines  Take over-the-counter and prescription medicines only as told by your doctor.  Do not take any new medicines without first talking to your doctor.  If you are taking blood thinners: ? Talk with your doctor before you take any medicines that have aspirin or NSAIDs, such as ibuprofen, in them. ? Take your medicine exactly as told by your doctor. Take it at the same time each day. ? Avoid activities that could hurt or bruise you. Follow instructions about how to prevent falls. ? Wear a bracelet that says you are taking blood thinners. Or, carry a card that lists what medicines you take. Lifestyle      Do not use any products that have nicotine or tobacco in them. These include cigarettes, e-cigarettes, and chewing tobacco. If you need help quitting, ask your doctor.  Eat heart-healthy foods. Talk with your doctor about the right eating plan for you.  Exercise regularly as told by your doctor.  Do not drink alcohol.  Lose weight if you are overweight.  Do not use drugs, including cannabis. General instructions  If you have a condition that causes breathing to stop for a short period of time (apnea), treat it as told by your doctor.  Keep a healthy weight. Do not use diet pills unless your doctor says they are safe for you. Diet pills may make heart problems worse.  Keep all follow-up visits as told by your doctor. This is important. Contact a doctor if:  You notice a change   in the speed, rhythm, or strength of your heartbeat.  You are taking a blood-thinning medicine and you get more bruising.  You get tired more easily when you move or exercise.  You have a sudden change in weight. Get help right away if:   You have pain in your chest or your belly (abdomen).  You have trouble breathing.  You have side effects of blood thinners, such as blood in your vomit, poop (stool), or pee (urine), or bleeding that cannot  stop.  You have any signs of a stroke. "BE FAST" is an easy way to remember the main warning signs: ? B - Balance. Signs are dizziness, sudden trouble walking, or loss of balance. ? E - Eyes. Signs are trouble seeing or a change in how you see. ? F - Face. Signs are sudden weakness or loss of feeling in the face, or the face or eyelid drooping on one side. ? A - Arms. Signs are weakness or loss of feeling in an arm. This happens suddenly and usually on one side of the body. ? S - Speech. Signs are sudden trouble speaking, slurred speech, or trouble understanding what people say. ? T - Time. Time to call emergency services. Write down what time symptoms started.  You have other signs of a stroke, such as: ? A sudden, very bad headache with no known cause. ? Feeling like you may vomit (nausea). ? Vomiting. ? A seizure. These symptoms may be an emergency. Do not wait to see if the symptoms will go away. Get medical help right away. Call your local emergency services (911 in the [place] Do not drive yourself to the hospital. Summary  Atrial fibrillation is a type of heartbeat that is irregular or fast.  You are at higher risk of this condition if you smoke, are older, have diabetes, or are overweight.  Follow your doctor's instructions about medicines, diet, exercise, and follow-up visits.  Get help right away if you have signs or symptoms of a stroke.  Get help right away if you cannot catch your breath, or you have chest pain or discomfort. This information is not intended to replace advice given to you by your health care provider. Make sure you discuss any questions you have with your health care provider. Document Revised: 10/17/2018 Document Reviewed: 10/17/2018 Elsevier Patient Education  [place] Gastroenteritis, Adult Salmonella gastroenteritis is an infection of the intestines. It can cause nausea, vomiting, and other symptoms. Fever usually lasts for 2-3  days, and diarrhea lasts 4-10 days. Most people recover completely, but some people may develop lasting problems, such as arthritis, irritation of the eyes, or painful urination. What are the causes? This condition is caused by salmonella bacteria. These bacteria can spread through food that is not cooked properly, contact with animals that have the bacteria, or contact with a person's stool. You can get this infection by:  Eating food or drinking liquids that have the bacteria.  Drinking polluted standing water.  Coming into contact with an animal that is carrying the bacteria, such as a turtle, bird, snake, or iguana. What increases the risk? This condition is more likely to develop in:  Elderly adults.  People with a weakened disease-fighting system (immune system).  People with poor personal or kitchen hygiene.  People who have contact with animals that are known to carry the bacteria. What are the signs or symptoms? Symptoms of this condition include:  Diarrhea, which may be bloody.  Abdominal pain  or cramps.  Fever.  Chills.  Nausea.  Vomiting.  Headache. How is this diagnosed? This condition may be diagnosed based on:  Your symptoms.  Your medical history.  A physical exam.  A blood test.  A stool test. How is this treated? This condition may be managed by:  Drinking plenty of fluids. This is important because this infection can make you lose a lot of fluid (dehydrated).  Taking antibiotic medicines. These may be given if your condition is severe. Medicines may help shorten your illness. Follow these instructions at home: Medicines  Take over-the-counter and prescription medicines only as told by your health care provider.  Do not take medicines to help with diarrhea. These medicines can make the infection worse.  If you were prescribed an antibiotic medicine, take it as told by your health care provider. Do not stop taking the antibiotic even if  you start to feel better. Eating and drinking      Drink enough fluid to keep your urine pale yellow. This helps prevent dehydration.  Drink only clear liquids until your diarrhea, nausea, or vomiting is under control. Clear liquids are liquids you can see through, such as water, broth, fruit juice with added water (diluted fruit juice), low-calorie sports drinks, or non-caffeinated tea.  Take an oral rehydration solution (ORS) as told by your health care provider. This drink is sold at pharmacies and retail stores.  If you are not hungry, do not force yourself to eat.  Eat bland, easy-to-digest foods in small amounts as you are able. These foods include bananas, applesauce, rice, lean meats, toast and crackers.  Avoid: ? Fluids that contain a lot of sugar or caffeine, such as energy drinks, high-calorie sports drinks, and soda. ? Alcohol. ? Foods that are greasy or contain a lot of fat or sugar. ? Spicy foods.  Do not prepare food for others if you have diarrhea. Food safety   Use separate food preparation surfaces and storage spaces for raw meat and for fruits and vegetables.  Keep refrigerated foods colder than 72F (5C).  Serve hot foods immediately or keep them heated above 172F (60C).  Always cook meat, eggs, seafood, and poultry thoroughly.  Do not eat or drink unpasteurized dairy products.  Wash your hands thoroughly after handling or preparing meat, eggs, seafood, and poultry.  Wash your hands, food preparation surfaces, and utensils thoroughly before and after you handle raw foods.  Wash your hands thoroughly before eating. General instructions  Wash your hands often with soap and water. If soap and water are not available, use hand sanitizer. This helps keep the bacteria from spreading to others.  Keep track of changes in your weight. Losing a lot of weight can be a sign of a serious problem. Ask your health care provider how much weight loss should concern  you.  Stay home from work or school as told by your health care provider.  Keep all follow-up visits as told by your health care provider. This is important. Contact a health care provider if you:  Have a fever.  Have diarrhea that has blood or mucus in it.  Feel weak or dizzy.  Have a headache.  Have urinated only a small amount of very dark urine over 6-8 hours.  Have weight loss.  Have redness, irritation, or pain in your eyes.  Have pain when urinating.  Have swelling or a feeling of warmth in a joint. Get help right away if you:  Cannot keep fluids down.  Cannot stop vomiting or having diarrhea.  Have pain in the abdomen, and the pain gets worse.  Have any symptoms of severe dehydration. These include: ? Not urinating in 6-8 hours. ? Cold and clammy skin. ? Extreme thirst. ? Confusion. ? Rapid breathing. ? Difficulty waking from sleep.  Have changes in vision or loss of vision. Summary  Salmonella gastroenteritis is an infection of the intestines. It can cause nausea, vomiting, and other symptoms. It is caused by salmonella bacteria.  People can get this condition by eating foods or drinking liquids that have the bacteria, or by coming into contact with an animal that is carrying the bacteria.  People with the condition need to drink plenty of fluids to avoid dehydration. Treatment for a severe case may include antibiotic medicines.  Follow your health care provider's instructions for taking medicines, eating and drinking, handling food in a safe way, and calling for help. This information is not intended to replace advice given to you by your health care provider. Make sure you discuss any questions you have with your health care provider. Document Revised: 06/05/2018 Document Reviewed: 06/08/2017 Elsevier Patient Education  2020 [employer].

## 2020-03-20 NOTE — Telephone Encounter (Signed)
Thanks

## 2020-03-20 NOTE — Discharge Summary (Signed)
Physician Discharge Summary  Darryl Diaz HUT:[id number] DOB: Apr 16, 1941 DOA: 03/16/2020  PCP: [name], MD  Admit date: 03/16/2020 Discharge date: 03/20/2020  Admitted From: Home  Disposition: Home   Recommendations for Outpatient Follow-up:  1. Follow up with PCP in 1-2 weeks 2. Follow-up with cardiology/A. fib clinic in 1 to 2 weeks 3. Amiodarone increased from 200 mg p.o. daily to 400 mg p.o. twice daily by cardiology 4. Continue ciprofloxacin to complete 14-day course for Salmonella gastroenteritis 5. Continue fluconazole to complete 14-day course for oral candidiasis 6. Please obtain BMP in one week  Home Health: PT/OT/RN/aide/social work Equipment/Devices: 3 1 bedside commode, rolling walker  Discharge Condition: Stable CODE STATUS: Full code Diet recommendation: Heart healthy diet  History of present illness:  Darryl Diaz is a 79 year old male with past medical history significant for atrial fibrillation on anticoagulation, upper GI bleed secondary to Mallory-Weiss tear, OSA, CAD status post cardiac arrest, alcohol dependence who presents from home following fall.  Patient initially activated EMS in which they returned him to his chair.  Following day, he was unable to move with severe lower back pain in which he activated EMS once again.  He also reported episode of diarrhea after eating old food.  Denies blood in the stool.  No nausea/vomiting.  Patient recently discharged by the cardiology service following DCCV, started flecainide (? Compliance), and subsequently started on amiodarone.  Patient was discharged to inpatient rehabilitation from 10/29 through 03/11/2020.  In the ED, patient was noted to be in A. fib with RVR following subsequent recurrent falls at home.  Cardiology was consulted, started on amiodarone drip.  Hospitalist service consulted for further evaluation and treatment.  Hospital course:  Mechanical fall Patient presenting after  recurrent mechanical falls at home.  Recently discharged from [facility] on 03/11/2020.  Seen by PT/OT with recommendations of ALF placement, but has declined. Discharge home with home health PT/OT/RN/aide; patients sister [name] does not plan on staying with him much as she is unable to assist physically.  Patient's son will be coming to town next week.  Ultimately patient would benefit from ALF placement, which he states is currently working on.  Ultimately his underlying etiology likely from his EtOH use disorder  Gastroenteritis secondary to Salmonella Patient reports several loose stools after eating contaminated food.  GI PCR positive for Salmonella. Continue ciprofloxacin 500 mg p.o. twice daily to complete 14-day course  Acute renal failure Creatinine elevated up to 1.32 during admission, treated with IV fluid bolus.  Creatinine improved to 1.06 at time of discharge.  Recommend repeat BMP in the next PCP/specialist visit.  Atrial fibrillation with RVR Recently discharged by cardiology, failed DCCV.  Started on amiodarone drip on admission by cardiology, and transition to 400 mg p.o. twice daily.  Continues anticoagulation with Eliquis 5 mg p.o. twice daily.  Outpatient follow-up with cardiology.  Hypokalemia Repleted during hospitalization.  Potassium 3.8 at time of discharge.  Resume home potassium supplementation.  Hypomagnesemia Repleted during hospitalization.  Oral candidiasis Fluconazole 100mg  PO daily to complete 14-day course.   Essential hypertension Chronic diastolic congestive heart failure Home regimen includes furosemide 40 mg p.o. daily as needed.   GERD: Continue PPI  Hyperlipidemia: Atorvastatin 20 mg p.o. daily  Depression/anxiety: Lexapro 10 mg p.o. daily  Iron deficiency anemia: Ferrous sulfate 325 mg p.o. twice daily  BPH: Continue tamsulosin 0.4 mg p.o. daily  EtOH use disorder: Counseled on need for complete cessation.  Patient was monitored on  CIWAA protocol  while inpatient without any significant signs of withdrawal symptoms.  This is most likely his leading etiology of his recurrent hospitalizations.  Discharge Diagnoses:  Principal Problem:   Fall at home, initial encounter Active Problems:   Hypertension   Hyperlipidemia   Chronic diastolic congestive heart failure (HCC)   Atrial fibrillation with RVR (HCC)   Yeast infection   Diarrhea   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[place])   Salmonella enteritis    Discharge Instructions  Discharge Instructions    Amb referral to AFIB Clinic   Complete by: As directed    Call MD for:  difficulty breathing, headache or visual disturbances   Complete by: As directed    Call MD for:  extreme fatigue   Complete by: As directed    Call MD for:  persistant dizziness or light-headedness   Complete by: As directed    Call MD for:  persistant nausea and vomiting   Complete by: As directed    Call MD for:  severe uncontrolled pain   Complete by: As directed    Call MD for:  temperature >100.4   Complete by: As directed    Diet - low sodium heart healthy   Complete by: As directed    Increase activity slowly   Complete by: As directed    No wound care   Complete by: As directed      Allergies as of 03/20/2020      Reactions   Penicillins Other (See Comments)   Tolerated Cephalosporin Date: 10/15/19. Allergy is from childhood Did it involve swelling of the face/tongue/throat, SOB, or low BP? Unknown Did it involve sudden or severe rash/hives, skin peeling, or any reaction on the inside of your mouth or nose? Unknown Did you need to seek medical attention at a hospital or doctor's office? Unknown When did it last happen?childhood reaction If all above answers are "NO", may proceed with cephalosporin use.      Medication List    TAKE these medications   acetaminophen 325 MG tablet Commonly known as: TYLENOL Take 2 tablets (650 mg total) by mouth every 4  (four) hours as needed for headache or mild pain.   amiodarone 400 MG tablet Commonly known as: PACERONE Take 1 tablet (400 mg total) by mouth 2 (two) times daily. What changed:   medication strength  how much to take  when to take this   apixaban 5 MG Tabs tablet Commonly known as: Eliquis Take 1 tablet (5 mg total) by mouth 2 (two) times daily.   atorvastatin 20 MG tablet Commonly known as: LIPITOR Take 1 tablet (20 mg total) by mouth daily.   Centrum Silver 50+Men Tabs Take 1 tablet by mouth daily with breakfast.   ciprofloxacin 500 MG tablet Commonly known as: CIPRO Take 1 tablet (500 mg total) by mouth 2 (two) times daily for 11 days.   escitalopram 10 MG tablet Commonly known as: LEXAPRO Take 1 tablet (10 mg total) by mouth daily.   ferrous sulfate 325 (65 FE) MG tablet Take 1 tablet (325 mg total) by mouth 2 (two) times daily with a meal.   fluconazole 100 MG tablet Commonly known as: DIFLUCAN Take 1 tablet (100 mg total) by mouth daily for 10 days. Start taking on: March 21, 276   folic acid 1 MG tablet Commonly known as: FOLVITE Take 1 tablet (1 mg total) by mouth daily.   furosemide 40 MG tablet Commonly known as: LASIX Take 1 tablet (40 mg  total) by mouth daily as needed for fluid (weight gain).   loratadine 10 MG tablet Commonly known as: CLARITIN Take 1 tablet (10 mg total) by mouth daily.   nitroGLYCERIN 0.4 MG SL tablet Commonly known as: NITROSTAT Place 1 tablet (0.4 mg total) under the tongue every 5 (five) minutes x 3 doses as needed for chest pain.   pantoprazole 40 MG tablet Commonly known as: PROTONIX Take 1 tablet (40 mg total) by mouth daily.   potassium chloride SA 20 MEQ tablet Commonly known as: KLOR-CON Take 40 mEq by mouth daily.   tamsulosin 0.4 MG Caps capsule Commonly known as: FLOMAX Take 1 capsule (0.4 mg total) by mouth every evening.   traZODone 50 MG tablet Commonly known as: DESYREL Take 1 tablet (50 mg  total) by mouth at bedtime.            Durable Medical Equipment  (From admission, onward)         Start     Ordered   03/19/20 0724  For home use only DME Walker rolling  Once       Question Answer Comment  Walker: With [place] Wheels   Patient needs a walker to treat with the following condition Gait abnormality      03/19/20 0723   03/19/20 0724  For home use only DME 3 n 1  Once        03/19/20 [place] [place] [facility] Follow up.   Specialty: Home Health Services Why: HHOT, HHPT Contact information: [place] [address] [phone]        Tisovec, [name], MD. Schedule an appointment as soon as possible for a visit in 1 week(s).   Specialty: Internal Medicine Contact information: [place] [address] [phone]        Sherren Mocha, MD. Schedule an appointment as soon as possible for a visit in 1 week(s).   Specialty: Cardiology Contact information: [address] [address] [phone]        Deboraha Sprang, MD .   Specialty: Cardiology Contact information: [phone] [place] [address] [phone]              Allergies  Allergen Reactions  . Penicillins Other (See Comments)    Tolerated Cephalosporin Date: 10/15/19. Allergy is from childhood Did it involve swelling of the face/tongue/throat, SOB, or low BP? Unknown Did it involve sudden or severe rash/hives, skin peeling, or any reaction on the inside of your mouth or nose? Unknown Did you need to seek medical attention at a hospital or doctor's office? Unknown When did it last happen?childhood reaction If all above answers are "NO", may proceed with cephalosporin use.    Consultations:  Cardiology   Procedures/Studies: NM Myocar Multi W/Spect W/Wall Motion / EF  Result Date: 02/23/2020  There was no ST segment  deviation noted during stress.  The study is normal.  This is a low risk study.  The left ventricular ejection fraction is normal (55-65%).    DG Chest Portable 1 View  Result Date: 02/22/2020 CLINICAL DATA:  Syncope EXAM: PORTABLE CHEST 1 VIEW COMPARISON:  February 05, 2020 FINDINGS: Lungs are clear. Heart is borderline enlarged with pulmonary vascularity normal. No adenopathy. No bone lesions. IMPRESSION: Borderline cardiac enlargement.  Lungs clear. Electronically Signed   By:  Lowella Grip III M.D.   On: 02/22/2020 13:51   CT Renal Stone Study  Result Date: 03/16/2020 CLINICAL DATA:  [place]. Abrasions to lower back. EXAM: CT ABDOMEN AND PELVIS WITHOUT CONTRAST TECHNIQUE: Multidetector CT imaging of the abdomen and pelvis was performed following the standard protocol without IV contrast. COMPARISON:  08/11/2018 FINDINGS: Lower chest: The lung bases are clear of an acute process. No pleural effusions or pneumothorax. The heart is normal in size. No pericardial effusion. Coronary artery and aortic calcifications are noted. Small hiatal hernia. Remote healed rib fractures are noted. No definite acute fractures. Hepatobiliary: Cirrhotic changes involving the liver appears stable. No obvious hepatic lesions. Small layering gallstones are noted the gallbladder. No CT findings to suggest acute cholecystitis. No common bile duct dilatation. Pancreas: Mild pancreatic atrophy. No mass, inflammation or ductal dilatation. Spleen: Normal size. No focal lesions. Adrenals/Urinary Tract: The adrenal glands are normal. No renal, ureteral or bladder calculi are identified. No worrisome renal lesions. Small exophytic hyperdense lesion projecting off the lower pole region of the left kidney is stable and likely a benign hemorrhagic cyst. No obvious bladder mass. Visualization of bladder is limited due to artifact from bilateral hip prostheses. Stomach/Bowel: The stomach, duodenum, small bowel and colon are grossly  normal without oral contrast. No acute inflammatory changes, mass lesions or obstructive findings. There is fluid noted in the colon which can be seen with diarrhea. The terminal ileum and appendix appear normal. Vascular/Lymphatic: Aortic calcifications but no aneurysm. No mesenteric or retroperitoneal mass, adenopathy or hematoma. Reproductive: The prostate gland and seminal vesicles are grossly normal but limited visualization. Other: No free pelvic fluid collections or pelvic hematoma. No free air or free fluid is identified. Musculoskeletal: Advanced degenerative changes in the lumbar spine but no acute fracture is identified. The bony pelvis is intact. No pelvic fractures. The pubic symphysis and SI joints are intact. There are bilateral hip prostheses with significant artifact but no obvious complicating features are identified. IMPRESSION: 1. No acute abdominal/pelvic findings, mass lesions or adenopathy. 2. Stable cirrhotic changes involving the liver. 3. Cholelithiasis. 4. No acute bony injury is identified. 5. Fluid in the colon can be seen with diarrhea. 6. Aortic atherosclerosis. Aortic Atherosclerosis (ICD10-I70.0). Electronically Signed   By: Marijo Sanes M.D.   On: 03/16/2020 05:09   ECHOCARDIOGRAM LIMITED  Result Date: 02/25/2020    ECHOCARDIOGRAM LIMITED REPORT   Patient Name:   Darryl Diaz Date of Exam: 02/23/2020 Medical Rec #:  [id number]        Height:       72.0 in Accession #:    [id number]       Weight:       235.0 lb Date of Birth:  [birth year]        BSA:          2.282 m Patient Age:    87 years         BP:           132/85 mmHg Patient Gender: M                HR:           117 bpm. Exam Location:  Inpatient Procedure: Limited Echo, Cardiac Doppler, Color Doppler and Intracardiac            Opacification Agent Indications:    Syncope  History:        Patient has prior history of Echocardiogram examinations, most  recent 02/06/2020. Arrythmias:Atrial Fibrillation,                  Signs/Symptoms:Syncope; Risk Factors:Hypertension, Dyslipidemia                 and Former Smoker. GERD.  Sonographer:    Clayton Lefort RDCS (AE) Referring Phys: [id number] [place]  1. Left ventricular ejection fraction, by estimation, is 60 to 65%. The left ventricle has normal function. The left ventricle has no regional wall motion abnormalities. Left ventricular diastolic function could not be evaluated.  2. Apical RV hyperkinesis. Right ventricular systolic function is normal. The right ventricular size is mildly enlarged.  3. Left atrial size was mildly dilated.  4. The mitral valve is grossly normal. Trivial mitral valve regurgitation.  5. The aortic valve was not well visualized. Aortic valve regurgitation is not visualized.  6. There is dilatation of the aortic root, measuring 42 mm. There is dilatation of the ascending aorta, measuring 39 mm.  7. The inferior vena cava is dilated in size with <50% respiratory variability, suggesting right atrial pressure of 15 mmHg. Comparison(s): A prior study was performed on 02/06/20. Compared to prior, McConnell's Sign redemonstrated. Stable Aortic Root and Ascending Aorta measurements. IVC dilation increased from prior. Less MR demonstrated in this study. FINDINGS  Left Ventricle: Left ventricular ejection fraction, by estimation, is 60 to 65%. The left ventricle has normal function. The left ventricle has no regional wall motion abnormalities. Definity contrast agent was given IV to delineate the left ventricular  endocardial borders. Left ventricular diastolic function could not be evaluated. Right Ventricle: Apical RV hyperkinesis. The right ventricular size is mildly enlarged. Right ventricular systolic function is normal. Left Atrium: Left atrial size was mildly dilated. Right Atrium: Right atrial size was normal in size. Pericardium: There is no evidence of pericardial effusion. Mitral Valve: The mitral valve is grossly normal.  Trivial mitral valve regurgitation. Tricuspid Valve: The tricuspid valve is not well visualized. Tricuspid valve regurgitation is not demonstrated. Aortic Valve: The aortic valve was not well visualized. Aortic valve regurgitation is not visualized. Pulmonic Valve: The pulmonic valve was not well visualized. Pulmonic valve regurgitation is not visualized. Aorta: Index to height squared only 2.16. There is dilatation of the aortic root, measuring 42 mm. There is dilatation of the ascending aorta, measuring 39 mm. Venous: The inferior vena cava is dilated in size with less than 50% respiratory variability, suggesting right atrial pressure of 15 mmHg. IAS/Shunts: The atrial septum is grossly normal. LEFT VENTRICLE PLAX 2D LVOT diam:     2.20 cm LVOT Area:     3.80 cm  IVC IVC diam: 2.80 cm  AORTA Ao Root diam: 4.20 cm Ao Asc diam:  3.90 cm  SHUNTS Systemic Diam: 2.20 cm Rudean Haskell MD Electronically signed by Rudean Haskell MD Signature Date/Time: 02/25/2020/11:47:53 AM    Final       Subjective: Patient seen and examined bedside, resting comfortably.  Ready for discharge home.  States home health will meet him at home after his sister comes and picks him up this afternoon.  Discussed with him once again need for complete alcohol cessation as this is likely leading etiology to his recurrent hospitalizations.  No other complaints or concerns at this time.  Denies headache, no dizziness, no chest pain, no palpitations, no shortness of breath, no abdominal pain.  No acute events overnight per nurse staff.  Discharge Exam: Vitals:   03/20/20 0338 03/20/20 0743  BP: Marland Kitchen)  130/56 132/61  Pulse: 62 70  Resp: 18 17  Temp: 97.6 F (36.4 C) (!) 97.3 F (36.3 C)  SpO2: 95% 95%   Vitals:   03/19/20 2341 03/20/20 0338 03/20/20 0502 03/20/20 0743  BP: (!) 141/91 (!) 130/56  132/61  Pulse: 68 62  70  Resp: 16 18  17   Temp: 98 F (36.7 C) 97.6 F (36.4 C)  (!) 97.3 F (36.3 C)  TempSrc: Oral  Oral  Axillary  SpO2: 97% 95%  95%  Weight:   101 kg   Height:        General: Pt is alert, awake, not in acute distress Cardiovascular: RRR, S1/S2 +, no rubs, no gallops Respiratory: CTA bilaterally, no wheezing, no rhonchi Abdominal: Soft, NT, ND, bowel sounds + Extremities: no edema, no cyanosis    The results of significant diagnostics from this hospitalization (including imaging, microbiology, ancillary and laboratory) are listed below for reference.     Microbiology: Recent Results (from the past 240 hour(s))  Respiratory Panel by RT PCR (Flu A&B, Covid) - Nasopharyngeal Swab     Status: None   Collection Time: 03/16/20  6:34 AM   Specimen: Nasopharyngeal Swab  Result Value Ref Range Status   SARS Coronavirus 2 by RT PCR NEGATIVE NEGATIVE Final    Comment: (NOTE) SARS-CoV-2 target nucleic acids are NOT DETECTED.  The SARS-CoV-2 RNA is generally detectable in upper respiratoy specimens during the acute phase of infection. The lowest concentration of SARS-CoV-2 viral copies this assay can detect is 131 copies/mL. A negative result does not preclude SARS-Cov-2 infection and should not be used as the sole basis for treatment or other patient management decisions. A negative result may occur with  improper specimen collection/handling, submission of specimen other than nasopharyngeal swab, presence of viral mutation(s) within the areas targeted by this assay, and inadequate number of viral copies (<131 copies/mL). A negative result must be combined with clinical observations, patient history, and epidemiological information. The expected result is Negative.  Fact Sheet for Patients:  PinkCheek.be  Fact Sheet for Healthcare Providers:  GravelBags.it  This test is no t yet approved or cleared by the [place] FDA and  has been authorized for detection and/or diagnosis of SARS-CoV-2 by FDA under an Emergency  Use Authorization (EUA). This EUA will remain  in effect (meaning this test can be used) for the duration of the COVID-19 declaration under Section 564(b)(1) of the Act, 21 U.S.C. section 360bbb-3(b)(1), unless the authorization is terminated or revoked sooner.     Influenza A by PCR NEGATIVE NEGATIVE Final   Influenza B by PCR NEGATIVE NEGATIVE Final    Comment: (NOTE) The Xpert Xpress SARS-CoV-2/FLU/RSV assay is intended as an aid in  the diagnosis of influenza from Nasopharyngeal swab specimens and  should not be used as a sole basis for treatment. Nasal washings and  aspirates are unacceptable for Xpert Xpress SARS-CoV-2/FLU/RSV  testing.  Fact Sheet for Patients: PinkCheek.be  Fact Sheet for Healthcare Providers: GravelBags.it  This test is not yet approved or cleared by the [place] FDA and  has been authorized for detection and/or diagnosis of SARS-CoV-2 by  FDA under an Emergency Use Authorization (EUA). This EUA will remain  in effect (meaning this test can be used) for the duration of the  Covid-19 declaration under Section 564(b)(1) of the Act, 21  U.S.C. section 360bbb-3(b)(1), unless the authorization is  terminated or revoked. Performed at [facility], [place] [address] [place], [place]  [address]   Gastrointestinal Panel by PCR , Stool     Status: Abnormal   Collection Time: 03/16/20  9:06 AM   Specimen: Stool  Result Value Ref Range Status   Campylobacter species NOT DETECTED NOT DETECTED Final   Plesimonas shigelloides NOT DETECTED NOT DETECTED Final   Salmonella species DETECTED (A) NOT DETECTED Final    Comment: RESULT CALLED TO, READ BACK BY AND VERIFIED WITH: MAGGIE BARBER AT 1351 03/16/20 SDR    Yersinia enterocolitica NOT DETECTED NOT DETECTED Final   Vibrio species NOT DETECTED NOT DETECTED Final   Vibrio cholerae NOT DETECTED NOT DETECTED Final   Enteroaggregative E coli (EAEC) NOT  DETECTED NOT DETECTED Final   Enteropathogenic E coli (EPEC) NOT DETECTED NOT DETECTED Final   Enterotoxigenic E coli (ETEC) NOT DETECTED NOT DETECTED Final   Shiga like toxin producing E coli (STEC) NOT DETECTED NOT DETECTED Final   Shigella/Enteroinvasive E coli (EIEC) NOT DETECTED NOT DETECTED Final   Cryptosporidium NOT DETECTED NOT DETECTED Final   Cyclospora cayetanensis NOT DETECTED NOT DETECTED Final   Entamoeba histolytica NOT DETECTED NOT DETECTED Final   Giardia lamblia NOT DETECTED NOT DETECTED Final   Adenovirus F40/41 NOT DETECTED NOT DETECTED Final   Astrovirus NOT DETECTED NOT DETECTED Final   Norovirus GI/GII NOT DETECTED NOT DETECTED Final   Rotavirus A NOT DETECTED NOT DETECTED Final   Sapovirus (I, II, IV, and V) NOT DETECTED NOT DETECTED Final    Comment: Performed at [facility], [address] [place], [place] [address]  MRSA PCR Screening     Status: None   Collection Time: 03/17/20  3:00 PM   Specimen: Nasal Mucosa; Nasopharyngeal  Result Value Ref Range Status   MRSA by PCR NEGATIVE NEGATIVE Final    Comment:        The GeneXpert MRSA Assay (FDA approved for NASAL specimens only), is one component of a comprehensive MRSA colonization surveillance program. It is not intended to diagnose MRSA infection nor to guide or monitor treatment for MRSA infections. Performed at [facility], [place] [address] [place], [place] [address]      Labs: BNP (last 3 results) Recent Labs    02/06/20 0916 02/22/20 1433  BNP 381.9* 629.4*   Basic Metabolic Panel: Recent Labs  Lab 03/16/20 0508 03/18/20 0019 03/19/20 0024 03/20/20 0019  NA 126* 131* 134* 133*  K 3.5 2.9* 3.9 3.8  CL 91* 99 106 104  CO2 23 23 21* 19*  GLUCOSE 117* 105* 108* 99  BUN 14 18 17 15   CREATININE 1.15 1.20 1.32* 1.06  CALCIUM 8.5* 8.8* 8.8* 8.6*  MG  --   --  1.5* 1.6*   Liver Function Tests: Recent Labs  Lab 03/16/20 0508  AST 32  ALT 15  ALKPHOS 85   BILITOT 1.2  PROT 6.1*  ALBUMIN 3.3*   No results for input(s): LIPASE, AMYLASE in the last 168 hours. No results for input(s): AMMONIA in the last 168 hours. CBC: Recent Labs  Lab 03/16/20 0508 03/17/20 0957  WBC 11.5* 8.7  NEUTROABS 9.9* 7.3  HGB 11.2* 11.1*  HCT 33.8* 33.9*  MCV 94.7 94.2  PLT 212 185   Cardiac Enzymes: No results for input(s): CKTOTAL, CKMB, CKMBINDEX, TROPONINI in the last 168 hours. BNP: Invalid input(s): POCBNP CBG: No results for input(s): GLUCAP in the last 168 hours. D-Dimer No results for input(s): DDIMER in the last 72 hours. Hgb A1c No results for input(s): HGBA1C in the  last 72 hours. Lipid Profile No results for input(s): CHOL, HDL, LDLCALC, TRIG, CHOLHDL, LDLDIRECT in the last 72 hours. Thyroid function studies No results for input(s): TSH, T4TOTAL, T3FREE, THYROIDAB in the last 72 hours.  Invalid input(s): FREET3 Anemia work up No results for input(s): VITAMINB12, FOLATE, FERRITIN, TIBC, IRON, RETICCTPCT in the last 72 hours. Urinalysis    Component Value Date/Time   COLORURINE STRAW (A) 02/22/2020 1532   APPEARANCEUR CLEAR 02/22/2020 1532   LABSPEC 1.005 02/22/2020 1532   PHURINE 7.0 02/22/2020 1532   GLUCOSEU NEGATIVE 02/22/2020 1532   HGBUR NEGATIVE 02/22/2020 [place] 02/22/2020 1532   KETONESUR NEGATIVE 02/22/2020 1532   PROTEINUR NEGATIVE 02/22/2020 1532   NITRITE NEGATIVE 02/22/2020 [place] 02/22/2020 1532   Sepsis Labs Invalid input(s): PROCALCITONIN,  WBC,  LACTICIDVEN Microbiology Recent Results (from the past 240 hour(s))  Respiratory Panel by RT PCR (Flu A&B, Covid) - Nasopharyngeal Swab     Status: None   Collection Time: 03/16/20  6:34 AM   Specimen: Nasopharyngeal Swab  Result Value Ref Range Status   SARS Coronavirus 2 by RT PCR NEGATIVE NEGATIVE Final    Comment: (NOTE) SARS-CoV-2 target nucleic acids are NOT DETECTED.  The SARS-CoV-2 RNA is generally detectable  in upper respiratoy specimens during the acute phase of infection. The lowest concentration of SARS-CoV-2 viral copies this assay can detect is 131 copies/mL. A negative result does not preclude SARS-Cov-2 infection and should not be used as the sole basis for treatment or other patient management decisions. A negative result may occur with  improper specimen collection/handling, submission of specimen other than nasopharyngeal swab, presence of viral mutation(s) within the areas targeted by this assay, and inadequate number of viral copies (<131 copies/mL). A negative result must be combined with clinical observations, patient history, and epidemiological information. The expected result is Negative.  Fact Sheet for Patients:  PinkCheek.be  Fact Sheet for Healthcare Providers:  GravelBags.it  This test is no t yet approved or cleared by the [place] FDA and  has been authorized for detection and/or diagnosis of SARS-CoV-2 by FDA under an Emergency Use Authorization (EUA). This EUA will remain  in effect (meaning this test can be used) for the duration of the COVID-19 declaration under Section 564(b)(1) of the Act, 21 U.S.C. section 360bbb-3(b)(1), unless the authorization is terminated or revoked sooner.     Influenza A by PCR NEGATIVE NEGATIVE Final   Influenza B by PCR NEGATIVE NEGATIVE Final    Comment: (NOTE) The Xpert Xpress SARS-CoV-2/FLU/RSV assay is intended as an aid in  the diagnosis of influenza from Nasopharyngeal swab specimens and  should not be used as a sole basis for treatment. Nasal washings and  aspirates are unacceptable for Xpert Xpress SARS-CoV-2/FLU/RSV  testing.  Fact Sheet for Patients: PinkCheek.be  Fact Sheet for Healthcare Providers: GravelBags.it  This test is not yet approved or cleared by the [place] FDA and  has been  authorized for detection and/or diagnosis of SARS-CoV-2 by  FDA under an Emergency Use Authorization (EUA). This EUA will remain  in effect (meaning this test can be used) for the duration of the  Covid-19 declaration under Section 564(b)(1) of the Act, 21  U.S.C. section 360bbb-3(b)(1), unless the authorization is  terminated or revoked. Performed at [facility], [place] [address] [place], [place] [address]   Gastrointestinal Panel by PCR , Stool     Status: Abnormal   Collection Time: 03/16/20  9:06 AM   Specimen: Stool  Result Value Ref Range Status   Campylobacter species NOT DETECTED NOT DETECTED Final   Plesimonas shigelloides NOT DETECTED NOT DETECTED Final   Salmonella species DETECTED (A) NOT DETECTED Final    Comment: RESULT CALLED TO, READ BACK BY AND VERIFIED WITH: MAGGIE BARBER AT 1351 03/16/20 SDR    Yersinia enterocolitica NOT DETECTED NOT DETECTED Final   Vibrio species NOT DETECTED NOT DETECTED Final   Vibrio cholerae NOT DETECTED NOT DETECTED Final   Enteroaggregative E coli (EAEC) NOT DETECTED NOT DETECTED Final   Enteropathogenic E coli (EPEC) NOT DETECTED NOT DETECTED Final   Enterotoxigenic E coli (ETEC) NOT DETECTED NOT DETECTED Final   Shiga like toxin producing E coli (STEC) NOT DETECTED NOT DETECTED Final   Shigella/Enteroinvasive E coli (EIEC) NOT DETECTED NOT DETECTED Final   Cryptosporidium NOT DETECTED NOT DETECTED Final   Cyclospora cayetanensis NOT DETECTED NOT DETECTED Final   Entamoeba histolytica NOT DETECTED NOT DETECTED Final   Giardia lamblia NOT DETECTED NOT DETECTED Final   Adenovirus F40/41 NOT DETECTED NOT DETECTED Final   Astrovirus NOT DETECTED NOT DETECTED Final   Norovirus GI/GII NOT DETECTED NOT DETECTED Final   Rotavirus A NOT DETECTED NOT DETECTED Final   Sapovirus (I, II, IV, and V) NOT DETECTED NOT DETECTED Final    Comment: Performed at [facility], [address] [place], [place] [address]  MRSA PCR Screening      Status: None   Collection Time: 03/17/20  3:00 PM   Specimen: Nasal Mucosa; Nasopharyngeal  Result Value Ref Range Status   MRSA by PCR NEGATIVE NEGATIVE Final    Comment:        The GeneXpert MRSA Assay (FDA approved for NASAL specimens only), is one component of a comprehensive MRSA colonization surveillance program. It is not intended to diagnose MRSA infection nor to guide or monitor treatment for MRSA infections. Performed at [facility], [place] [address], [place], [place] [address]      Time coordinating discharge: Over 30 minutes  SIGNED:   Barnet Benavides J [place] DO  [facility] 03/20/2020, 10:22 AM

## 2020-03-20 NOTE — Progress Notes (Signed)
Physical Therapy Treatment Patient Details Name: Darryl Diaz MRN: [id number] DOB: October 07, 1940 Today's Date: 03/20/2020    History of Present Illness 79yo male with recent admission 10/16 to 10/29 due to syncope, went to CIR then home. Was doing well until he fell at home, called EMS who got him up but he later called them again due to severe pain and RLE radiculopathy. Found to be in A-fib with RVR in the ED, also with diarrhea and groin rash. 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    PT Comments    Pt demonstrating improved safety and control during transfer and during ambulation with RW; pt less impulsive and able to slow down movements to increase control and safety; pt does continue to demonstrate deficits in balance, strength, gait, endurance and safety and will benefit from additional PT to progress deficits to maximize independence with functional mobility    Follow Up Recommendations  Home health PT;Supervision/Assistance - 24 hour;Other (comment)     Equipment Recommendations  Rolling walker with 5" wheels;3in1 (PT)    Recommendations for Other Services       Precautions / Restrictions      Mobility  Bed Mobility                  Transfers Overall transfer level: Needs assistance Equipment used: Rolling walker (2 wheeled) Transfers: Sit to/from Stand;Stand Pivot Transfers Sit to Stand: Supervision Stand pivot transfers: Supervision       General transfer comment: pt able to scoot to edge of chair without cueing and able to push up wtih improved control wtih use of RW  Ambulation/Gait Ambulation/Gait assistance: Min guard;Supervision Gait Distance (Feet): 200 Feet Assistive device: Rolling walker (2 wheeled)       General Gait Details: pt with improved safety with use of RW requiring S during straight ambulation and min guard  during turns with RW   Stairs             Wheelchair Mobility    Modified Rankin (Stroke Patients Only)       Balance                                            Cognition                                              Exercises      General Comments        Pertinent Vitals/Pain      Home Living                      Prior Function            PT Goals (current goals can now be found in the care plan section) Acute Rehab PT Goals Patient Stated Goal: go home tomorrow PT Goal Formulation: With patient Time For Goal Achievement: 03/30/20 Potential to Achieve Goals: Good Progress towards PT goals: Progressing toward goals    Frequency    Min 3X/week      PT Plan Current plan remains appropriate    Co-evaluation  AM-PAC PT "6 Clicks" Mobility   Outcome Measure  Help needed turning from your back to your side while in a flat bed without using bedrails?: None Help needed moving from lying on your back to sitting on the side of a flat bed without using bedrails?: None Help needed moving to and from a bed to a chair (including a wheelchair)?: None Help needed standing up from a chair using your arms (e.g., wheelchair or bedside chair)?: None Help needed to walk in hospital room?: A Little Help needed climbing 3-5 steps with a railing? : A Little 6 Click Score: 22    End of Session Equipment Utilized During Treatment: Gait belt Activity Tolerance: Patient tolerated treatment well Patient left: in chair;with chair alarm set;with call bell/phone within reach Nurse Communication: Mobility status PT Visit Diagnosis: History of falling (Z91.81);Muscle weakness (generalized) (M62.81);Difficulty in walking, not elsewhere classified (R26.2);Unsteadiness on feet (R26.81);Repeated falls (R29.6)     Time: 4818-5909 PT Time Calculation (min) (ACUTE ONLY): 11 min  Charges:  $Gait Training: 8-22  mins                     Lyanne Co, DPT Acute Rehabilitation Services [phone]   [name] 03/20/2020, 12:04 PM

## 2020-03-20 NOTE — Plan of Care (Signed)
  Problem: Education: Goal: Knowledge of General Education information will improve Description Including pain rating scale, medication(s)/side effects and non-pharmacologic comfort measures Outcome: Progressing   

## 2020-03-20 NOTE — Consult Note (Signed)
   [facility] Inpatient Consult   03/20/2020  NYKEEM CITRO 05/21/1940 [id number]    Update: Inpatient TOC RNCM updated this writer that patient has been accepted in the [facility] noted] arranged with family for post hospital care needs.    Will update [facility] RNCM for ongoing follow up needs.  Natividad Brood, RN BSN [facility] Liaison  [phone] business mobile phone Toll free office [phone]  Fax number: [phone] [email or web address]

## 2020-03-24 ENCOUNTER — Emergency Department (HOSPITAL_COMMUNITY)
Admission: EM | Admit: 2020-03-24 | Discharge: 2020-03-24 | Disposition: A | Discharge status: Home / Self Care | Payer: Medicare Other | Attending: Emergency Medicine | Admitting: Emergency Medicine

## 2020-03-24 ENCOUNTER — Other Ambulatory Visit: Payer: Self-pay | Admitting: *Deleted

## 2020-03-24 ENCOUNTER — Emergency Department (HOSPITAL_COMMUNITY): Payer: Medicare Other

## 2020-03-24 DIAGNOSIS — W01198A Fall on same level from slipping, tripping and stumbling with subsequent striking against other object, initial encounter: Secondary | ICD-10-CM | POA: Insufficient documentation

## 2020-03-24 DIAGNOSIS — S0081XA Abrasion of other part of head, initial encounter: Secondary | ICD-10-CM | POA: Diagnosis not present

## 2020-03-24 DIAGNOSIS — S2241XA Multiple fractures of ribs, right side, initial encounter for closed fracture: Secondary | ICD-10-CM | POA: Diagnosis not present

## 2020-03-24 DIAGNOSIS — S199XXA Unspecified injury of neck, initial encounter: Secondary | ICD-10-CM | POA: Diagnosis not present

## 2020-03-24 DIAGNOSIS — Z043 Encounter for examination and observation following other accident: Secondary | ICD-10-CM | POA: Diagnosis not present

## 2020-03-24 DIAGNOSIS — T148XXA Other injury of unspecified body region, initial encounter: Secondary | ICD-10-CM

## 2020-03-24 DIAGNOSIS — Y92009 Unspecified place in unspecified non-institutional (private) residence as the place of occurrence of the external cause: Secondary | ICD-10-CM | POA: Insufficient documentation

## 2020-03-24 DIAGNOSIS — S90415A Abrasion, left lesser toe(s), initial encounter: Secondary | ICD-10-CM | POA: Diagnosis not present

## 2020-03-24 DIAGNOSIS — S0990XA Unspecified injury of head, initial encounter: Secondary | ICD-10-CM | POA: Diagnosis not present

## 2020-03-24 DIAGNOSIS — Z743 Need for continuous supervision: Secondary | ICD-10-CM | POA: Diagnosis not present

## 2020-03-24 DIAGNOSIS — R5381 Other malaise: Secondary | ICD-10-CM | POA: Diagnosis not present

## 2020-03-24 DIAGNOSIS — W19XXXA Unspecified fall, initial encounter: Secondary | ICD-10-CM | POA: Diagnosis not present

## 2020-03-24 DIAGNOSIS — R279 Unspecified lack of coordination: Secondary | ICD-10-CM | POA: Diagnosis not present

## 2020-03-24 DIAGNOSIS — R000 Tachycardia, unspecified: Secondary | ICD-10-CM | POA: Diagnosis not present

## 2020-03-24 DIAGNOSIS — S2232XA Fracture of one rib, left side, initial encounter for closed fracture: Secondary | ICD-10-CM | POA: Diagnosis not present

## 2020-03-24 DIAGNOSIS — I10 Essential (primary) hypertension: Secondary | ICD-10-CM | POA: Diagnosis not present

## 2020-03-24 DIAGNOSIS — S0091XA Abrasion of unspecified part of head, initial encounter: Secondary | ICD-10-CM | POA: Diagnosis not present

## 2020-03-24 MED ORDER — HYDROCODONE-ACETAMINOPHEN 5-325 MG PO TABS
1.0000 | ORAL_TABLET | ORAL | 0 refills | Status: DC | PRN
Start: 2020-03-24 — End: 2020-06-16

## 2020-03-24 MED ORDER — HYDROCODONE-ACETAMINOPHEN 5-325 MG PO TABS
1.0000 | ORAL_TABLET | Freq: Once | ORAL | Status: AC
  Administered 2020-03-24: 1 via ORAL
  Filled 2020-03-24: qty 1

## 2020-03-24 NOTE — Progress Notes (Signed)
Orthopedic Tech Progress Note Patient Details:  Darryl Diaz 04-Jul-1940 [id number] Level 2 trauma this morning Patient ID: [name], male   DOB: Mar 14, 1941, 79 y.o.   MRN: [id number]   [name] 03/24/2020, 10:59 AM

## 2020-03-24 NOTE — ED Notes (Signed)
Incentive spirometry performed with patient.

## 2020-03-24 NOTE — Discharge Instructions (Signed)
Keep your wounds clean and dry.  You can apply Neosporin or bacitracin.  As we discussed, your imaging today showed fractures of the fifth and sixth rib.  There is some question of this is new or if this was from a previous fall.  We will plan to treat her pain.  Use the incentive spirometer as directed.  Return to emergency department for any worsening pain, fever, difficulty breathing or any other worsening concerning symptoms.

## 2020-03-24 NOTE — ED Provider Notes (Signed)
[facility] EMERGENCY DEPARTMENT Provider Note   CSN: [id number] Arrival date & time: 03/24/20  0945     History Chief Complaint  Patient presents with  . Fall    Darryl Diaz is a 79 y.o. male past 1 history of A. fib (currently on Eliquis) brought in by EMS for evaluation of unwitnessed fall at home that occurred this morning.  Patient reports that he was using the restroom and he slipped on some spilled fluid.  He states that he hit his head.  He does not think he had any LOC.  He crawled to the bedroom and called EMS.  On EMS arrival, he was having some repetitive questions but they states that this improved prior to coming to the ED.  Patient did not have any preceding chest pain that caused him to fall.  Reports pain noted to his right lateral chest wall as well as his left foot.  He denies any neck pain, back pain, chest pain, difficulty breathing, abdominal pain, nausea/vomiting, numbness/weakness.  The history is provided by the patient.       No past medical history on file.  There are no problems to display for this patient.    The histories are not reviewed yet. Please review them in the "History" navigator section and refresh this [place].     No family history on file.  Social History   Tobacco Use  . Smoking status: Not on file  Substance Use Topics  . Alcohol use: Not on file  . Drug use: Not on file    Home Medications Prior to Admission medications   Medication Sig Start Date End Date Taking? Authorizing Provider  HYDROcodone-acetaminophen (NORCO/VICODIN) 5-325 MG tablet Take 1-2 tablets by mouth every 4 (four) hours as needed. 03/24/20   Volanda Napoleon, PA-C    Allergies    Penicillins  Review of Systems   Review of Systems  Eyes: Negative for visual disturbance.  Respiratory: Negative for cough and shortness of breath.   Cardiovascular: Negative for chest pain.  Gastrointestinal: Negative for abdominal pain, nausea  and vomiting.  Musculoskeletal:       Chest wall pain  Skin: Positive for wound.  Neurological: Negative for weakness, numbness and headaches.  All other systems reviewed and are negative.   Physical Exam Updated Vital Signs BP (!) 168/73   Pulse 73   Temp 97.7 F (36.5 C) (Oral)   Resp 19   SpO2 100%   Physical Exam Vitals and nursing note reviewed.  Constitutional:      Appearance: Normal appearance. He is well-developed.  HENT:     Head: Normocephalic.      Comments: Small abrasion noted to the left side of head. Eyes:     General: Lids are normal.     Conjunctiva/sclera: Conjunctivae normal.     Pupils: Pupils are equal, round, and reactive to light.     Comments: PERRL. EOMs intact. No nystagmus. No neglect.   Neck:     Comments: C-collar in place.  No deformity or step-offs noted. Cardiovascular:     Rate and Rhythm: Normal rate and regular rhythm.     Pulses: Normal pulses.          Radial pulses are 2+ on the right side and 2+ on the left side.       Dorsalis pedis pulses are 2+ on the right side and 2+ on the left side.     Heart sounds: Normal heart  sounds. No murmur heard.  No friction rub. No gallop.   Pulmonary:     Effort: Pulmonary effort is normal.     Breath sounds: Normal breath sounds.     Comments: Lungs clear to auscultation bilaterally.  Symmetric chest rise.  No wheezing, rales, rhonchi. Chest:       Comments: Tenderness palpation noted to the right lateral chest wall.  No deformity or crepitus noted. Abdominal:     Palpations: Abdomen is soft. Abdomen is not rigid.     Tenderness: There is no abdominal tenderness. There is no guarding.     Comments: Abdomen is soft, non-distended, non-tender. No rigidity, No guarding. No peritoneal signs.  Musculoskeletal:        General: Normal range of motion.  Skin:    General: Skin is warm and dry.     Capillary Refill: Capillary refill takes less than 2 seconds.     Comments: Abrasion noted to the  left second toe.  Scattered abrasions noted to the right lateral chest wall.  Small abrasion noted to the right mid abdomen.  Neurological:     Mental Status: He is alert and oriented to person, place, and time.     Comments: Alert and oriented x3.  Able to answer questions appropriately.  Cranial nerves III-XII intact Follows commands, Moves all extremities  5/5 strength to BUE and BLE  Sensation intact throughout all major nerve distributions. No gait abnormalities  No slurred speech. No facial droop.   Psychiatric:        Speech: Speech normal.     ED Results / Procedures / Treatments   Labs (all labs ordered are listed, but only abnormal results are displayed) Labs Reviewed - No data to display  EKG None  Radiology DG Chest 2 View  Result Date: 03/24/2020 CLINICAL DATA:  Fall at home EXAM: CHEST - 2 VIEW COMPARISON:  02/22/2020 FINDINGS: Extensive artifact from EKG leads. Normal heart size and mediastinal contours. Mild chronic reticulation suspected at the lung bases. Spondylosis with multi-level bridging. Possible nonacute posterior right fifth and sixth rib fractures with callus. Reportedly there are no current chest complaints. IMPRESSION: No acute finding when compared to prior. Possible healing right fifth and sixth rib fractures posteriorly. Electronically Signed   By: Monte Fantasia M.D.   On: 03/24/2020 10:47   DG Pelvis 1-2 Views  Result Date: 03/24/2020 CLINICAL DATA:  Fall. EXAM: PELVIS - 1-2 VIEW COMPARISON:  10/15/2019. FINDINGS: Bilateral hip replacements again noted. Hardware intact. Stable alignment. Degenerative change lumbar spine. Old small avulsion or postsurgical change right acetabulum. No acute abnormality. No evidence of fracture dislocation. IMPRESSION: Bilateral hip replacements again noted. Hardware intact. Stable alignment. No acute abnormality identified. Electronically Signed   By: Marcello Moores  Register   On: 03/24/2020 10:49   CT Head Wo  Contrast  Result Date: 03/24/2020 CLINICAL DATA:  Fall.  Trauma.  On Eliquis. EXAM: CT HEAD WITHOUT CONTRAST CT CERVICAL SPINE WITHOUT CONTRAST TECHNIQUE: Multidetector CT imaging of the head and cervical spine was performed following the standard protocol without intravenous contrast. Multiplanar CT image reconstructions of the cervical spine were also generated. COMPARISON:  02/05/2020. FINDINGS: CT HEAD FINDINGS Brain: No evidence of acute infarction, hemorrhage, hydrocephalus, extra-axial collection or mass lesion/mass effect. 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 Similar generalized atrophy with ex vacuo ventricular dilation. Vascular: Calcific intracranial atherosclerosis. Skull: No acute fracture. Sinuses/Orbits: Mild scattered ethmoid air cell mucosal thickening without air-fluid levels. Unremarkable  orbits. Other: No mastoid effusions. CT CERVICAL SPINE FINDINGS Alignment: Mild anterolisthesis of C4 on C5, similar to prior. No traumatic malalignment. Skull base and vertebrae: No acute fracture. Vertebral body heights are maintained. Soft tissues and spinal canal: No prevertebral fluid or swelling. No visible canal hematoma. Disc levels: Similar ankylosis across the left C3-C4 and right C5-C6 facet joints. Similar bulky multilevel facet arthropathy. Similar likely severe foraminal stenosis on the left at C3-C4 with varying degrees of foraminal stenosis at the other levels. Anterior osteophytes. Upper chest: No acute findings. Other: Atherosclerotic calcifications. IMPRESSION: 1. No evidence of acute intracranial abnormality. 2. No evidence of acute fracture or traumatic malalignment in the cervical spine. 3. Similar multilevel degenerative change, as detailed above. Electronically Signed   By: Margaretha Sheffield MD   On: 03/24/2020 11:15   CT Cervical Spine Wo Contrast  Result Date: 03/24/2020 CLINICAL DATA:  Fall.  Trauma.  On  Eliquis. EXAM: CT HEAD WITHOUT CONTRAST CT CERVICAL SPINE WITHOUT CONTRAST TECHNIQUE: Multidetector CT imaging of the head and cervical spine was performed following the standard protocol without intravenous contrast. Multiplanar CT image reconstructions of the cervical spine were also generated. COMPARISON:  02/05/2020. FINDINGS: CT HEAD FINDINGS Brain: No evidence of acute infarction, hemorrhage, hydrocephalus, extra-axial collection or mass lesion/mass effect. 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 Similar generalized atrophy with ex vacuo ventricular dilation. Vascular: Calcific intracranial atherosclerosis. Skull: No acute fracture. Sinuses/Orbits: Mild scattered ethmoid air cell mucosal thickening without air-fluid levels. Unremarkable orbits. Other: No mastoid effusions. CT CERVICAL SPINE FINDINGS Alignment: Mild anterolisthesis of C4 on C5, similar to prior. No traumatic malalignment. Skull base and vertebrae: No acute fracture. Vertebral body heights are maintained. Soft tissues and spinal canal: No prevertebral fluid or swelling. No visible canal hematoma. Disc levels: Similar ankylosis across the left C3-C4 and right C5-C6 facet joints. Similar bulky multilevel facet arthropathy. Similar likely severe foraminal stenosis on the left at C3-C4 with varying degrees of foraminal stenosis at the other levels. Anterior osteophytes. Upper chest: No acute findings. Other: Atherosclerotic calcifications. IMPRESSION: 1. No evidence of acute intracranial abnormality. 2. No evidence of acute fracture or traumatic malalignment in the cervical spine. 3. Similar multilevel degenerative change, as detailed above. Electronically Signed   By: Margaretha Sheffield MD   On: 03/24/2020 11:15   DG Foot Complete Left  Result Date: 03/24/2020 CLINICAL DATA:  Unwitnessed fall.  Left toe abrasion EXAM: LEFT FOOT - COMPLETE 3+ VIEW COMPARISON:  None. FINDINGS:  Limited by hammertoe deformity and 1/2 toe overlap. Generalized osteopenia. No evidence of acute fracture or dislocation. Postoperative ankle.  Advanced ankle and subtalar osteoarthritis. IMPRESSION: No acute finding. 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 Electronically Signed   By: Monte Fantasia M.D.   On: 03/24/2020 10:48    Procedures Procedures (including critical care time)  Medications Ordered in ED Medications  HYDROcodone-acetaminophen (NORCO/VICODIN) 5-325 MG per tablet 1 tablet (1 tablet Oral Given 03/24/20 1303)    ED Course  I have reviewed the triage vital signs and the nursing notes.  Pertinent labs & imaging results that were available during my care of the patient were reviewed by me and considered in my medical decision making (see chart for details).    MDM Rules/Calculators/A&P                          79 y.o. M who presents for evaluation of fall that  occurred this morning.  He reports that he was using the bathroom states he slipped on some liquid and fell, hitting his head.  Does not think he had LOC.  He called to the bedroom and called EMS.  On EMS, he had some repetitive questioning.  States and states that has improved.  Initially arrival, he is alert and oriented x3 and is able answer questions appropriately.  He is a small abrasion in the left side of head.  No underlying skull deformity or crepitus noted.  He also has an abrasion noted to the left second digit of his foot.  He has some tenderness to palpation noted to the right lateral chest wall.  He has a slight abrasion in his abdomen but is not tender.  He has a benign abdominal exam.  Plan for imaging.  CT head and CT cervical spine negative for any acute abnormality. CXR shows possible nonacute 5th and 6th rib fractures. Pelvis x-ray shows bilateral hip replacements noted. Foot x-ray negative for any acute abnormalities.  Discussed results with patient.  He does state that he  had a fall several weeks ago.  He does not know if he fell on that right side.  Question of these rib fractures are from today's fall versus his previous fall.  Patient ambulated in the ED. Patient is alert and oriented and able to answer all questions.  Incentive spirometer given here in the ED.  Plan for short course of pain control.  Encouraged at home supportive care measures. At this time, patient exhibits no emergent life-threatening condition that require further evaluation in ED. Patient had ample opportunity for questions and discussion. All patient's questions were answered with full understanding. Strict return precautions discussed. Patient expresses understanding and agreement to plan.   Portions of this note were generated with Lobbyist. Dictation errors may occur despite best attempts at proofreading.  Final Clinical Impression(s) / ED Diagnoses Final diagnoses:  Fall, initial encounter  Abrasion  Closed fracture of multiple ribs of right side, initial encounter    Rx / DC Orders ED Discharge Orders         Ordered    HYDROcodone-acetaminophen (NORCO/VICODIN) 5-325 MG tablet  Every 4 hours PRN        03/24/20 1318           Volanda Napoleon, PA-C 03/24/20 1449    Little, Wenda Overland, MD 03/24/20 1455

## 2020-03-24 NOTE — Patient Outreach (Signed)
[facility]) Care Management  03/24/2020  MAXEMILIANO RIEL 1940/09/19 [id number]   EMMI-GENERAL DISCHARGE RED ON EMMI ALERT Day #1 Date: 03/22/2020 Red Alert Reason: UNFILLED PRESCRIPTIONS  OUTREACH#1; RN attempted outreach call today however unsuccessful. HIPAA voice message left to mobile number requesting a call back.   Will scheduled another outreach over the next week.  ____________________________________________________  Telephone Assessment-Unsuccessful  RN attempted outreach call today however unsuccessful. RN able to leave a HIPAA approved voice message requesting a call back.  Will scheduled outreach call over the next week.  [name], RN Care Management Coordinator [place] Office [phone]

## 2020-03-24 NOTE — ED Triage Notes (Signed)
Patient had unwitnessed fall at home in bathroom. Pt states he slipped on urine after using restroom and fell. Pt states hit left side of head during fall. Denies any LOC. Pt has abrasion to left head, right rib left toe and abd. Pt is A & O x4. Hx of a-fib. Pt is on eliquis

## 2020-03-24 NOTE — ED Notes (Signed)
Patient transported to X-ray and CT 

## 2020-03-24 NOTE — ED Notes (Signed)
PT NEXT FOR PTAR, vss on monitor. Pt incontinent, peri care and linen change provided

## 2020-03-27 ENCOUNTER — Other Ambulatory Visit: Payer: Self-pay | Admitting: *Deleted

## 2020-03-27 NOTE — Patient Outreach (Signed)
[facility]) Care Management  03/27/2020  NAZAIAH NAVARRETE 12-31-1940 [id number]    EMMI-GENERAL DISCHARGE RED ON EMMI ALERT Day #1 Date: 03/22/2020 Red Alert Reason: unfilled prescriptions  Outreach #2 RN spoke with pt today and inquired further. All issues resolved as pt reports his son is living with him now and handling all his medical appointments and related issues. No additional needs reported at this time. [facility] will be closed. ______________________________________________  Referral for [facility] complex case management Outreach #4  Telephone Assessment-Successful-Declined [facility] services  RN spoke with pt today and explained the purpose for today's call. Pt reports he has his son living with him now who is handling most of his care. RN inquired on the referral and offered further assistance in managing his care however pt very adamant about having all the assistance needed. Pt explained Alvis Lemmings is no longer involved due to his move [place]) and much discussion on pt's existing medical condition related to HTN/HF. Pt states he has a blood pressures home device and will start using it more to monitor his pressures. Pt reports his is unable to weigh daily due to issues with his ribs (fractured) and painful at times but aware if he had any fluid retention and what to do. Pt able to recognize his HF medications and reports he is adherent with all his medications. No further inquires or request at this time as pt again decline [facility] services at this time but receptive to contacts and [facility] packet for future inquires.  [name], RN Care Management Coordinator [place] Office [phone]

## 2020-03-27 NOTE — Patient Outreach (Signed)
[facility]) Care Management  03/27/2020  JAYVEN NAILL May 21, 1940 [id number]  Telephone Assessment  Unsuccessful outreach attempt. RN able to leave a HIPAA approved voice message requesting a call back.  Will continue outreach attempts accordingly.  [name], RN Care Management Coordinator [place] Office [phone]

## 2020-04-09 ENCOUNTER — Encounter: Payer: Self-pay | Admitting: Physician Assistant

## 2020-04-09 NOTE — Progress Notes (Signed)
Cardiology Office Note    Date:  04/10/2020   ID:  Darryl Diaz, Darryl Diaz Sep 22, 1940, MRN [id number]  PCP:  [name], MD  Cardiologist:  Sherren Mocha, MD  Electrophysiologist:  Virl Axe, MD   Chief Complaint: f/u atrial fibrillation  History of Present Illness:   Darryl Diaz is a 79 y.o. male with history of persistent atrial fibrillation s/p multiple unsuccessful cardioversions, prior brady arrest 04/2019, recurrent syncope, GIB in 10 2020, recurrent falls, HTN, HLD, GERD, OSA, prior alcoholism, depression following wife's death, cirrhotic changes of the liver, iron deficiency anemia, OA, chronic diastolic CHF, hypotension, dilation of ascending aorta/aortic root who presents for post-hospital follow-up.  The patient has had a challenging year-plus. In 02/2019 he was admitted for GIB, fall, anemia requiring transfusion, with EGD showing Mallory Weiss tear and erosions/erythema in the stomach with concern for ETOH related gastritis. He also had AF RVR treated with IV amiodarone. In 04/2019 he had summoned EMS due to weakness and home HRs in the 20s and while en route had cardiac arrest requiring CPR. He did see EP who noted he had been drinking a lot of grapefruit juice, raising question of CCB toxicity (he has since stopped this). He was eventually able to restart beta blocker. He was readmitted 01/2020 with weakness, fatigue and AF RVR and underwent successful cardioversion. Electrolytes were repleted. He required wound care for pressure wounds. Echo showed normal LVEF but some RV changes (McCdonnell's sign). When seen in follow-up soon after in the office 02/18/20, he was back in atrial fib and hypotensive. His Toprol was reduced and APP reviewed with Dr. Caryl Comes who recommended to start flecainide with plan for repeat DCCV. However, prior to this ocurring, he came back to the ED 02/22/20 with episode of syncope. He was once again found to be in AF RVR. 2D echo outlined below  with McConnell's sign but PE felt less likely as d-dimer was negative. He underwent NST 02/23/20 which was normal. He underwent repeat DCCV 02/24/20 but had recurrence of AF therefore was restarted on amiodarone instead. He was seen by EP in rounds who felt syncope was likely due to bradycardia, and BB/CCB were discontinued. It was felt if he had recurrent episodes he would need PPM for tachy-brady syndrome. He required discharge to CIR. He was then once again admitted 11/8-11/12/21 for recurrent mechanical falls at home in the context of gastroenteritis positive for salmonella, AKI, AF RVR, hypokalemia, hypomagnesemia, oral candidasis, and iron deficiency anemia. Per last cardiology rounding note 03/17/20 he was in NSR and recommended to continue oral amiodarone with discharge dose of 400mg  BID per AVS. Of note he was back in the ED one more time recently on 03/24/20 with yet another mechanical fall and was discharged with pain medicine.  He is seen back for follow-up today doing remarkably well.  It is my first time meeting him.  He states he has not felt this good in a long time.  He has not had any interim falls, dizziness, chest pain, shortness of breath, or palpitations since his last ED visit in November.  He is set up to begin physical therapy soon.  He has no longer drinking any grapefruit juice.  Initial BP was 100/54 by CMA, recheck by me 111/54. Subsequent formal orthostatics were obtained in the clinic today showing: Lying 119/72 Pulse 60 Sitting 104/65 Pulse 62 Standing 94/58 Pulse 71 Standing 93min 101/58 Pulse 71  Labwork independently reviewed: 03/2020 Na 133, K 3.8, Cr 1.06,  Mg 1.6, Hgb 11.1, albumin 3.3, AST/ALT OK 01/2020 TSH wnl   Past Medical History:  Diagnosis Date  . Alcoholism ([place])    in remission following wife's death  . Anemia   . 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 Bradycardia    low 20's  . Bradycardic cardiac arrest ([place])   . Cardiac arrest ([place]) 04/20/2019   12  min CPR with epi  . Chronic diastolic CHF (congestive heart failure) ([place])   . Fall at home 03/28/2016  . Fatty liver   . Gastritis   . GERD (gastroesophageal reflux disease)   . H/O seasonal allergies   . History of GI bleed   . Hx of adenomatous colonic polyps 08/10/2017  . Hyperlipidemia   . Hypertension   . Hypotension   . Insomnia   . 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 Mallory-Weiss tear   . OA (osteoarthritis)    hands  . OSA (obstructive sleep apnea)    No longer uses CPAP  . Persistent atrial fibrillation with rapid ventricular response ([place]) 02/05/2020  . Recurrent syncope   . 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 ANKLE SURGERY Left 2014   had rods put in   . BIOPSY  02/28/2019   Procedure: BIOPSY;  Surgeon: Milus Banister, MD;  Location: [facility] ENDOSCOPY;  Service: Endoscopy;;  . CARDIOVERSION  02/06/2020  . CARDIOVERSION N/A 02/06/2020   Procedure: CARDIOVERSION;  Surgeon: Werner Lean, MD;  Location: [facility] ENDOSCOPY;  Service: Cardiovascular;  Laterality: N/A;  . CARDIOVERSION N/A 02/24/2020   Procedure: CARDIOVERSION;  Surgeon: Werner Lean, MD;  Location: [facility] ENDOSCOPY;  Service: Cardiovascular;  Laterality: N/A;  . [place]  . ESOPHAGOGASTRODUODENOSCOPY (EGD) WITH PROPOFOL N/A 02/28/2019   Procedure: ESOPHAGOGASTRODUODENOSCOPY (EGD) WITH PROPOFOL;  Surgeon: Milus Banister, MD;  Location: [facility] ENDOSCOPY;  Service: Endoscopy;  Laterality: N/A;  . HIP ARTHROPLASTY Left 04/01/2016   Procedure: ARTHROPLASTY BIPOLAR HIP (HEMIARTHROPLASTY);  Surgeon: Paralee Cancel, MD;  Location: [place];  Service: Orthopedics;  Laterality: Left;  . HIP CLOSED REDUCTION Left 04/30/2016   Procedure: CLOSED REDUCTION HIP;  Surgeon: Wylene Simmer, MD;  Location: [place];  Service: Orthopedics;  Laterality: Left;  . TONSILLECTOMY    . TOTAL HIP ARTHROPLASTY Right 10/15/2019   Procedure: TOTAL HIP  ARTHROPLASTY ANTERIOR APPROACH;  Surgeon: Paralee Cancel, MD;  Location: [facility];  Service: Orthopedics;  Laterality: Right;  70 mins    Current Medications: Current Meds  Medication Sig  . acetaminophen (TYLENOL) 325 MG tablet Take 2 tablets (650 mg total) by mouth every 4 (four) hours as needed for headache or mild pain.  Marland Kitchen apixaban (ELIQUIS) 5 MG TABS tablet Take 1 tablet (5 mg total) by mouth 2 (two) times daily.  Marland Kitchen atorvastatin (LIPITOR) 20 MG tablet Take 1 tablet (20 mg total) by mouth daily.  Marland Kitchen escitalopram (LEXAPRO) 10 MG tablet Take 1 tablet (10 mg total) by mouth daily.  . ferrous sulfate 325 (65 FE) MG tablet Take 1 tablet (325 mg total) by mouth 2 (two) times daily with a meal.  . folic acid (FOLVITE) 1 MG tablet Take 1 tablet (1 mg total) by mouth daily.  . furosemide (LASIX) 40 MG tablet Take 1 tablet (40 mg total) by mouth daily as needed for fluid (weight gain).  Marland Kitchen HYDROcodone-acetaminophen (NORCO/VICODIN) 5-325 MG tablet Take 1-2 tablets by mouth every 4 (four) hours  as needed.  . loratadine (CLARITIN) 10 MG tablet Take 1 tablet (10 mg total) by mouth daily.  . Multiple Vitamins-Minerals (CENTRUM SILVER 50+MEN) TABS Take 1 tablet by mouth daily with breakfast.  . nitroGLYCERIN (NITROSTAT) 0.4 MG SL tablet Place 1 tablet (0.4 mg total) under the tongue every 5 (five) minutes x 3 doses as needed for chest pain.  . pantoprazole (PROTONIX) 40 MG tablet Take 1 tablet (40 mg total) by mouth daily.  . potassium chloride SA (KLOR-CON) 20 MEQ tablet Take 40 mEq by mouth daily.  . tamsulosin (FLOMAX) 0.4 MG CAPS capsule Take 1 capsule (0.4 mg total) by mouth every evening.  . traZODone (DESYREL) 50 MG tablet Take 1 tablet (50 mg total) by mouth at bedtime.  . [DISCONTINUED] amiodarone (PACERONE) 400 MG tablet Take 1 tablet (400 mg total) by mouth 2 (two) times daily.    Allergies:   Penicillins and Penicillins   Social History   Socioeconomic History  . Marital status: Widowed     Spouse name: Not on file  . Number of children: 2  . Years of education: college  . Highest education level: Not on file  Occupational History  . Occupation: retired  Tobacco Use  . Smoking status: Former Smoker    Types: Cigarettes  . Smokeless tobacco: Never Used  . Tobacco comment:    quit 20 years  smoked on and off for 20 years  Vaping Use  . Vaping Use: Never used  Substance and Sexual Activity  . Alcohol use: Yes    Comment: 2 drinks per week  . Drug use: No  . Sexual activity: Not Currently  Other Topics Concern  . Not on file  Social History Narrative   The patient is divorced w/ 1 son 1 daughter   He is a retired Regulatory affairs officer, he lived in [place] but [employer] including windows on the world in the [place] room in [place]   He moved to [place] to be near his sister who lives in [place] alcoholic member of AA   2 caffeinated beverages daily prior smoker no drugs   Social Determinants of Radio broadcast assistant Strain:   . Difficulty of Paying Living Expenses: Not on file  Food Insecurity:   . Worried About Charity fundraiser in the Last Year: Not on file  . Ran Out of Food in the Last Year: Not on file  Transportation Needs:   . Lack of Transportation (Medical): Not on file  . Lack of Transportation (Non-Medical): Not on file  Physical Activity:   . Days of Exercise per Week: Not on file  . Minutes of Exercise per Session: Not on file  Stress:   . Feeling of Stress : Not on file  Social Connections:   . Frequency of Communication with Friends and Family: Not on file  . Frequency of Social Gatherings with Friends and Family: Not on file  . Attends Religious Services: Not on file  . Active Member of Clubs or Organizations: Not on file  . Attends Archivist Meetings: Not on file  . Marital Status: Not on file     Family History:  The patient's *family history includes Arthritis in his mother;  Heart disease (age of onset: 3) in his mother; Heart failure in his sister; Heart failure (age of onset: 72) in his father; Hypertension in his mother; Stroke in his maternal aunt. There is no history of Colon  cancer, Esophageal cancer, Stomach cancer, or Rectal cancer.  ROS:   Please see the history of present illness.  All other systems are reviewed and otherwise negative.    EKGs/Labs/Other Studies Reviewed:    Studies reviewed are outlined and summarized above. Reports included below if pertinent.  Limited echo 02/23/20 IMPRESSIONS    1. Left ventricular ejection fraction, by estimation, is 60 to 65%. The  left ventricle has normal function. The left ventricle has no regional  wall motion abnormalities. Left ventricular diastolic function could not  be evaluated.  2. Apical RV hyperkinesis. Right ventricular systolic function is normal.  The right ventricular size is mildly enlarged.  3. Left atrial size was mildly dilated.  4. The mitral valve is grossly normal. Trivial mitral valve  regurgitation.  5. The aortic valve was not well visualized. Aortic valve regurgitation  is not visualized.  6. There is dilatation of the aortic root, measuring 42 mm. There is  dilatation of the ascending aorta, measuring 39 mm.  7. The inferior vena cava is dilated in size with <50% respiratory  variability, suggesting right atrial pressure of 15 mmHg.   Comparison(s): A prior study was performed on 02/06/20. Compared to prior,  McConnell's Sign redemonstrated. Stable Aortic Root and Ascending Aorta  measurements. IVC dilation increased from prior. Less MR demonstrated in  this study.     EKG:  EKG is ordered today, personally reviewed, demonstrating NSR 63bpm, nonspecific TW changes, otherwise nonacute, QTc 449ms, prior inferior and septal infarct pattern similar to prior  Recent Labs: 02/06/2020: TSH 1.764 02/22/2020: B Natriuretic Peptide 266.2 03/16/2020: ALT 15 03/17/2020:  Hemoglobin 11.1; Platelets 185 03/20/2020: BUN 15; Creatinine, Ser 1.06; Magnesium 1.6; Potassium 3.8; Sodium 133  Recent Lipid Panel    Component Value Date/Time   CHOL 81 02/28/2019 0314   TRIG 27 02/28/2019 0314   HDL 47 02/28/2019 0314   CHOLHDL 1.7 02/28/2019 0314   VLDL 5 02/28/2019 0314   LDLCALC 29 02/28/2019 0314    PHYSICAL EXAM:    VS:  BP (!) 111/54   Pulse 63   Ht 6' (1.829 m)   Wt 233 lb (105.7 kg)   BMI 31.60 kg/m   BMI: Body mass index is 31.6 kg/m.  GEN: Well nourished, well developed pale WM, in no acute distress HEENT: normocephalic, atraumatic Neck: no JVD, carotid bruits, or masses Cardiac: RRR; no murmurs, rubs, or gallops, mild soft bilateral ankle/shin edema  Respiratory:  clear to auscultation bilaterally, normal work of breathing GI: soft, nontender, nondistended, + BS MS: no deformity or atrophy Skin: warm and dry, no rash Neuro:  Alert and Oriented x 3, Strength and sensation are intact, follows commands Psych: euthymic mood, full affect  Wt Readings from Last 3 Encounters:  04/10/20 233 lb (105.7 kg)  03/20/20 222 lb 11.2 oz (101 kg)  03/11/20 225 lb 15.5 oz (102.5 kg)     ASSESSMENT & PLAN:   1. Persistent atrial fib complicated by recurrent syncope: I discussed his case with Dr. Caryl Comes this morning given the complexities.  Fortunately he is in normal sinus rhythm today. Amiodarone is less ideal given his baseline liver changes noted, but otherwise options are significantly limited.  His prior bradycardia limits use of traditional agents.  He is not an ideal candidate for pacemaker given his wound care issues and recent infections.  His baseline QTC as well as frequent hypokalemia and hypomagnesemia make Tikosyn a poor option.  He is not a candidate for sotalol due  to bradycardia.  Flecainide was previously a consideration, but then not pursued.  Per discussion with Dr. Caryl Comes, will plan to continue amiodarone for now but reduce maintenance dose  to 200 mg daily.  (Patient's previous rx was sent in for 400mg  tablet so he was instructed to make sure to use the new 200mg  tablet we sent today.) We will undertake a 30-day monitor to trend his heart rates and rhythm.  We will plan for him to have close EP follow-up after this is performed.  I did discuss his frequent falling with Dr. Caryl Comes with regards to his apixaban.  At this time, he recommends to continue anticoagulation in the absence of other major contraindications but this should be reevaluated on an ongoing basis at each visit.  2. Lab abnormalities to include hypokalemia, hypomagnesemia, mild anemia: We will recheck BMET, magnesium, and CBC today.  He carries a history of alcoholism in the past.  He reports he is only drinking a glass of wine with dinner twice a week at this point.  3. Chronic diastolic CHF with abnormal echocardiogram: Mild lower extremity edema on exam.  His previous echocardiogram showed findings of right heart failure.  PE work-up was not previously pursued given his other lack of clinical findings and negative D-dimer.  Would continue to manage this conservatively.  He uses furosemide on as needed basis.  Would not escalate this dosing too much as he does not have much baseline blood pressure to work with.  Would recommend conservative approach including elevation of extremities, reduction in sodium, and compression hose as tolerated. I'll also plan to reach out to Dr. Burt Knack for his input on the RV findings as well. The patient is not SOB, tachycardic, tachypneic, or hypoxic but I do have a sense this may be contributing to his tendency for edema.  4. Mild dilation of aortic root/ascending aorta: Noted on echocardiogram this year.  This can be followed at the further discretion of his primary cardiologist.  5. History of falls: Orthostatics performed today which showed a drop in blood pressure to 94/58. He was asymptomatic but given his history of more significant  hypotension in the past as well as tendency for falls, I think we should try to treat this. Aside from PRN Lasix and tamsulosin he is not on anything that would drop his pressure.  Will start midodrine at very low dose 2.5mg  TID. Did ask him to reach out to the prescriber of his tamsulosin to inquire whether there are any alternatives he could use instead since this can be a culprit. He inquires if he is okay to begin physical therapy from a cardiac standpoint.  I think this is a good idea but recommended he also include his primary care on these decisions. 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  He mentions that he uses his rolling walker every time he is out and about.  I did encourage the use of this at home as well to prevent falls. His son was staying with him but has since flown back out to [place].  Disposition: Multifaceted: - To f/u blood pressure and midodrine tolerance, 1-2 weeks with APP that is familiar with his care - F/u with Dr. Caryl Comes or EP APP in 10-11 weeks to allow time for monitor to process and be mailed - F/u with Dr. Burt Knack 3 months to have this on the books   Medication  Adjustments/Labs and Tests Ordered: Current medicines are reviewed at length with the patient today.  Concerns regarding medicines are outlined above. Medication changes, Labs and Tests ordered today are summarized above and listed in the Patient Instructions accessible in Encounters.   Signed, Charlie Pitter, PA-C  04/10/2020 9:00 AM    [address], [place], [place]  [address] Phone: [phone]; Fax: [phone]

## 2020-04-10 ENCOUNTER — Encounter: Payer: Self-pay | Admitting: Physician Assistant

## 2020-04-10 ENCOUNTER — Telehealth: Payer: Self-pay | Admitting: Radiology

## 2020-04-10 ENCOUNTER — Other Ambulatory Visit: Payer: Self-pay

## 2020-04-10 ENCOUNTER — Ambulatory Visit (INDEPENDENT_AMBULATORY_CARE_PROVIDER_SITE_OTHER): Payer: Medicare Other | Admitting: Physician Assistant

## 2020-04-10 VITALS — BP 111/54 | HR 63 | Ht 72.0 in | Wt 233.0 lb

## 2020-04-10 DIAGNOSIS — R931 Abnormal findings on diagnostic imaging of heart and coronary circulation: Secondary | ICD-10-CM | POA: Diagnosis not present

## 2020-04-10 DIAGNOSIS — Z9181 History of falling: Secondary | ICD-10-CM | POA: Diagnosis not present

## 2020-04-10 DIAGNOSIS — I4819 Other persistent atrial fibrillation: Secondary | ICD-10-CM | POA: Diagnosis not present

## 2020-04-10 DIAGNOSIS — D649 Anemia, unspecified: Secondary | ICD-10-CM | POA: Diagnosis not present

## 2020-04-10 DIAGNOSIS — I5032 Chronic diastolic (congestive) heart failure: Secondary | ICD-10-CM | POA: Diagnosis not present

## 2020-04-10 DIAGNOSIS — R55 Syncope and collapse: Secondary | ICD-10-CM

## 2020-04-10 DIAGNOSIS — E876 Hypokalemia: Secondary | ICD-10-CM | POA: Diagnosis not present

## 2020-04-10 DIAGNOSIS — I77810 Thoracic aortic ectasia: Secondary | ICD-10-CM | POA: Diagnosis not present

## 2020-04-10 LAB — BASIC METABOLIC PANEL
BUN/Creatinine Ratio: 12 (ref 10–24)
BUN: 13 mg/dL (ref 8–27)
CO2: 24 mmol/L (ref 20–29)
Calcium: 8.9 mg/dL (ref 8.6–10.2)
Chloride: 102 mmol/L (ref 96–106)
Creatinine, Ser: 1.09 mg/dL (ref 0.76–1.27)
GFR calc Af Amer: 75 mL/min/{1.73_m2} (ref >59)
GFR calc non Af Amer: 65 mL/min/{1.73_m2} (ref >59)
Glucose: 91 mg/dL (ref 65–99)
Potassium: 4.5 mmol/L (ref 3.5–5.2)
Sodium: 137 mmol/L (ref 134–144)

## 2020-04-10 LAB — CBC
Hematocrit: 28.7 % — ABNORMAL LOW (ref 37.5–51.0)
Hemoglobin: 9.4 g/dL — ABNORMAL LOW (ref 13.0–17.7)
MCH: 31.1 pg (ref 26.6–33.0)
MCHC: 32.8 g/dL (ref 31.5–35.7)
MCV: 95 fL (ref 79–97)
Platelets: 261 10*3/uL (ref 150–450)
RBC: 3.02 x10E6/uL — ABNORMAL LOW (ref 4.14–5.80)
RDW: 13.9 % (ref 11.6–15.4)
WBC: 5.3 10*3/uL (ref 3.4–10.8)

## 2020-04-10 LAB — MAGNESIUM: Magnesium: 1.7 mg/dL (ref 1.6–2.3)

## 2020-04-10 MED ORDER — MIDODRINE HCL 2.5 MG PO TABS: 2.5000 mg | ORAL_TABLET | Freq: Three times a day (TID) | ORAL | 3 refills | Status: DC

## 2020-04-10 MED ORDER — AMIODARONE HCL 200 MG PO TABS: 200.0000 mg | ORAL_TABLET | Freq: Every day | ORAL | 3 refills | Status: DC

## 2020-04-10 NOTE — Patient Instructions (Addendum)
Medication Instructions:  Your physician has recommended you make the following change in your medication:  1  REDUCE the Amiodarone to 200 mg taking only 1 tablet daily.  I have sent in a new prescription for this dose.. DO NOT FILL THE 400 MG TABLET. I have sent a note to [facility] to cancel those. 2.  START Midodrine 2.5 mg taking 1 tablet three times a day    *If you need a refill on your cardiac medications before your next appointment, please call your pharmacy*   Lab Work: TODAY:  BMET, CBC, & MAGNESIUM  If you have labs (blood work) drawn today and your tests are completely normal, you will receive your results only by: Marland Kitchen MyChart Message (if you have MyChart) OR . A paper copy in the mail If you have any lab test that is abnormal or we need to change your treatment, we will call you to review the results.   Testing/Procedures: Your physician has recommended that you wear an event monitor. Event monitors are medical devices that record the heart's electrical activity. Doctors most often [place] these monitors to diagnose arrhythmias. Arrhythmias are problems with the speed or rhythm of the heartbeat. The monitor is a small, portable device. You can wear one while you do your normal daily activities. This is usually used to diagnose what is causing palpitations/syncope (passing out).  SEE INSTRUCTIONS BELOW:   [employer] Cardiac Event Monitor Instructions Your physician has requested you wear your cardiac event monitor for _30____ days, (1-30). [employer] may call or text to confirm a shipping address. The monitor will be sent to a land address via UPS. [employer] will not ship a monitor to a PO BOX. It typically takes 3-5 days to receive your monitor after it has been enrolled. [employer] will assist with USPS tracking if your package is delayed. The telephone number for [employer] is [phone]. Once you have received your monitor, please review the enclosed instructions.  Instruction tutorials can also be viewed under help and settings on the enclosed cell phone. Your monitor has already been registered assigning a specific monitor serial # to you.  Applying the monitor Remove cell phone from case and turn it on. The cell phone works as Dealer and needs to be within [employer] of you at all times. The cell phone will need to be charged on a daily basis. We recommend you plug the cell phone into the enclosed charger at your bedside table every night.  Monitor batteries: You will receive two monitor batteries labelled #1 and #2. These are your recorders. Plug battery #2 onto the second connection on the enclosed charger. Keep one battery on the charger at all times. This will keep the monitor battery deactivated. It will also keep it fully charged for when you need to switch your monitor batteries. A small light will be blinking on the battery emblem when it is charging. The light on the battery emblem will remain on when the battery is fully charged.  Open package of a Monitor strip. Insert battery #1 into black hood on strip and gently squeeze monitor battery onto connection as indicated in instruction booklet. Set aside while preparing skin.  Choose location for your strip, vertical or horizontal, as indicated in the instruction booklet. Shave to remove all hair from location. There cannot be any lotions, oils, powders, or colognes on skin where monitor is to be applied. Wipe skin clean with enclosed Saline wipe. Dry skin completely.  Peel paper labeled #  1 off the back of the Monitor strip exposing the adhesive. Place the monitor on the chest in the vertical or horizontal position shown in the instruction booklet. One arrow on the monitor strip must be pointing upward. Carefully remove paper labeled #2, attaching remainder of strip to your skin. Try not to create any folds or wrinkles in the strip as you apply it.  Firmly press and release the  circle in the center of the monitor battery. You will hear a small beep. This is turning the monitor battery on. The heart emblem on the monitor battery will light up every 5 seconds if the monitor battery in turned on and connected to the patient securely. Do not push and hold the circle down as this turns the monitor battery off. The cell phone will locate the monitor battery. A screen will appear on the cell phone checking the connection of your monitor strip. This may read poor connection initially but change to good connection within the next minute. Once your monitor accepts the connection you will hear a series of 3 beeps followed by a climbing crescendo of beeps. A screen will appear on the cell phone showing the two monitor strip placement options. Touch the picture that demonstrates where you applied the monitor strip.  Your monitor strip and battery are waterproof. You are able to shower, bathe, or swim with the monitor on. They just ask you do not submerge deeper than 3 feet underwater. We recommend removing the monitor if you are swimming in a lake, river, or ocean.  Your monitor battery will need to be switched to a fully charged monitor battery approximately once a week. The cell phone will alert you of an action which needs to be made.  On the cell phone, tap for details to reveal connection status, monitor battery status, and cell phone battery status. The green dots indicates your monitor is in good status. A red dot indicates there is something that needs your attention.  To record a symptom, click the circle on the monitor battery. In 30-60 seconds a list of symptoms will appear on the cell phone. Select your symptom and tap save. Your monitor will record a sustained or significant arrhythmia regardless of you clicking the button. Some patients do not feel the heart rhythm irregularities. [employer] will notify us of any serious or critical events.  Refer to instruction  booklet for instructions on switching batteries, changing strips, the Do not disturb or Pause features, or any additional questions.  Call [employer] at [phone], to confirm your monitor is transmitting and record your baseline. They will answer any questions you may have regarding the monitor instructions at that time.  Returning the monitor to [place] all equipment back into blue box. Peel off strip of paper to expose adhesive and close box securely. There is a prepaid UPS shipping label on this box. Drop in a UPS drop box, or at a UPS facility like [employer]. You may also contact [employer] to arrange UPS to pick up monitor package at your home.      Follow-Up: At [facility], you and your health needs are our priority.  As part of our continuing mission to provide you with exceptional heart care, we have created designated Provider Care Teams.  These Care Teams include your primary Cardiologist (physician) and Advanced Practice Providers (APPs -  Physician Assistants and Nurse Practitioners) who all work together to provide you with the care you need, when you need it.  We  recommend signing up for the patient portal called "MyChart".  Sign up information is provided on this After Visit Summary.  MyChart is used to connect with patients for Virtual Visits (Telemedicine).  Patients are able to view lab/test results, encounter notes, upcoming appointments, etc.  Non-urgent messages can be sent to your provider as well.   To learn more about what you can do with MyChart, go to [email or web address].    Your next appointment:   9-10 WEEKS   The format for your next appointment:   In Person  Provider:   You may see Virl Axe, MD or one of the following Advanced Practice Providers on your designated Care Team:    Chanetta Marshall, NP  Tommye Standard, PA-C  Legrand Como "Jonni Sanger" [place], [place]     04/17/2020 ARRIVE AT 1:30 TO SEE VIN BHAGAT, PA-C  Your physician recommends  that you schedule a follow-up appointment in: [place]  Other Instructions  Your blood pressure test showed a drop in your blood pressure upon standing. We are starting a medicine called midodrine for this. Sometimes some of the other medicines you take can cause this issue - please talk to the prescriber of your tamsulosin/Flomax to determine if there is an alternative you can use.  For your lower extremity swelling, make sure to follow a low sodium diet, keep your legs elevated as much as possible, and try compression hose or ACE wraps to keep the blood flow closer to your heart.

## 2020-04-10 NOTE — Progress Notes (Signed)
I agree. From a practical standpoint, he had a negative d-dimer and he is already treated with apixaban so should be covered for PE treatment/prevention. I wouldn't work up any further.  Thx Dayna

## 2020-04-10 NOTE — Telephone Encounter (Signed)
Enrolled patient for a 30 day [employer] Event Monitor to be mailed to patients home  

## 2020-04-13 ENCOUNTER — Telehealth: Payer: Self-pay | Admitting: *Deleted

## 2020-04-13 ENCOUNTER — Encounter: Payer: Medicare Other | Admitting: Physical Medicine & Rehabilitation

## 2020-04-13 DIAGNOSIS — Z79899 Other long term (current) drug therapy: Secondary | ICD-10-CM

## 2020-04-13 MED ORDER — MAGNESIUM OXIDE -MG SUPPLEMENT 400 (240 MG) MG PO TABS: 1.0000 | ORAL_TABLET | Freq: Every day | ORAL | 11 refills | Status: DC

## 2020-04-13 NOTE — Telephone Encounter (Signed)
-----   Message from Charlie Pitter, [place] sent at 04/13/2020  8:13 AM EST ----- Please let pt know kidney function is stable.  Magnesium is low-normal at 1.7. Would recommend starting Magox 400mg  daily with recheck Mg in 1 week.  Of most concern is that his hemoglobin has decreased slightly again 11.1->9.4. He denied any recurrent GI bleeding at recent OV but needs to see his PCP early this week to recheck and trend. Please call his PCP's office to request they help follow this up for [place]. If they are unable to see him within the next day or so recommend getting f/u Hgb today in our office but still following up with their office. Please make sure he is not reporting any new blood in stool, black starry stools, blood in urine.  Dayna Dunn PA-C

## 2020-04-14 ENCOUNTER — Ambulatory Visit: Payer: Medicare Other | Admitting: Podiatry

## 2020-04-15 NOTE — Telephone Encounter (Signed)
Called pt to check in with him.  He hasn't picked up the MagOx as of yet, and said he will pick it up today. Pt is seeing his PCP today regarding HGB.

## 2020-04-17 ENCOUNTER — Ambulatory Visit (INDEPENDENT_AMBULATORY_CARE_PROVIDER_SITE_OTHER): Payer: Medicare Other | Admitting: Physician Assistant

## 2020-04-17 ENCOUNTER — Other Ambulatory Visit: Payer: Medicare Other

## 2020-04-17 DIAGNOSIS — I480 Paroxysmal atrial fibrillation: Secondary | ICD-10-CM

## 2020-04-17 NOTE — Progress Notes (Signed)
No show

## 2020-04-20 ENCOUNTER — Other Ambulatory Visit: Payer: Medicare Other

## 2020-04-22 DIAGNOSIS — H2512 Age-related nuclear cataract, left eye: Secondary | ICD-10-CM | POA: Diagnosis not present

## 2020-04-22 DIAGNOSIS — H33031 Retinal detachment with giant retinal tear, right eye: Secondary | ICD-10-CM | POA: Diagnosis not present

## 2020-04-22 DIAGNOSIS — H40023 Open angle with borderline findings, high risk, bilateral: Secondary | ICD-10-CM | POA: Diagnosis not present

## 2020-04-22 DIAGNOSIS — Z961 Presence of intraocular lens: Secondary | ICD-10-CM | POA: Diagnosis not present

## 2020-04-23 ENCOUNTER — Other Ambulatory Visit: Payer: Self-pay

## 2020-04-23 ENCOUNTER — Ambulatory Visit (INDEPENDENT_AMBULATORY_CARE_PROVIDER_SITE_OTHER): Payer: Medicare Other | Admitting: Podiatry

## 2020-04-23 ENCOUNTER — Telehealth: Payer: Self-pay | Admitting: Physician Assistant

## 2020-04-23 DIAGNOSIS — M2041 Other hammer toe(s) (acquired), right foot: Secondary | ICD-10-CM

## 2020-04-23 DIAGNOSIS — B351 Tinea unguium: Secondary | ICD-10-CM | POA: Diagnosis not present

## 2020-04-23 DIAGNOSIS — M79675 Pain in left toe(s): Secondary | ICD-10-CM

## 2020-04-23 DIAGNOSIS — M2011 Hallux valgus (acquired), right foot: Secondary | ICD-10-CM | POA: Diagnosis not present

## 2020-04-23 DIAGNOSIS — R52 Pain, unspecified: Secondary | ICD-10-CM | POA: Diagnosis not present

## 2020-04-23 DIAGNOSIS — M21611 Bunion of right foot: Secondary | ICD-10-CM

## 2020-04-23 DIAGNOSIS — L84 Corns and callosities: Secondary | ICD-10-CM

## 2020-04-23 DIAGNOSIS — M2042 Other hammer toe(s) (acquired), left foot: Secondary | ICD-10-CM

## 2020-04-23 DIAGNOSIS — M2012 Hallux valgus (acquired), left foot: Secondary | ICD-10-CM

## 2020-04-23 DIAGNOSIS — M79674 Pain in right toe(s): Secondary | ICD-10-CM

## 2020-04-23 DIAGNOSIS — M21612 Bunion of left foot: Secondary | ICD-10-CM

## 2020-04-23 NOTE — Progress Notes (Signed)
  Subjective:  Patient ID: Darryl Diaz, male    DOB: Apr 08, 1941,  MRN: [id number]  Chief Complaint  Patient presents with  . Callouses      np-pain outside left hallux toe; callus bilateral hallux toes-non req-dr. Delfino Lovett tisovec refer    79 y.o. male presents with the above complaint. History confirmed with patient.  Callus on the plantar left hallux is the worst part.  The nails are thickened elongated and discolored and are difficult to cut.  Objective:  Physical Exam: warm, good capillary refill, no trophic changes or ulcerative lesions, normal DP and PT pulses and normal sensory exam.  Onychomycosis with subungual debris, dystrophy and elongation and discoloration is noted.  Plantar left medial hallux there is a hyperkeratosis which is tender to touch.  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 Callus of foot   2. Pain   3. Onychomycosis   4. Pain due to onychomycosis of toenails of both feet   5. Hallux valgus with bunions, left   6. Hallux valgus with bunions, right   7. Hammertoe of left foot   8. Hammertoe of right foot      Plan:  Patient was evaluated and treated and all questions answered.  Discussed the etiology and treatment options for the condition in detail with the patient. Educated patient on the topical and oral treatment options for mycotic nails. Recommended debridement of the nails today. Sharp and mechanical debridement performed of all painful and mycotic nails today. Nails debrided in length and thickness using a nail nipper and a mechanical burr to level of comfort. Discussed treatment options including appropriate shoe gear. Follow up as needed for painful nails.   All symptomatic hyperkeratoses were safely debrided with a sterile #15 blade to patient's level of comfort without incident. We discussed preventative and palliative care of these lesions including supportive and accommodative shoegear, padding,  prefabricated and custom molded accommodative orthoses, use of a pumice stone and lotions/creams daily.   Discussed with him that the etiology of the callus is likely due to his significant hallux valgus.  Discussed the only way to treat this would be with surgical intervention.  He would prefer to avoid this at this time and think that is reasonable.  Dispensed a silicone offloading pad as well to alleviate the lesion.  We will continue to monitor his progress and if he desired surgical intervention we will revisit this idea.  Return in about 3 months (around 07/22/2020).

## 2020-04-23 NOTE — Telephone Encounter (Signed)
Patient said he is having a hard time figuring out what to do with his short term monitor he was told to wear. He would like someone to walk him through what to do. He is not home now but can be reached at home after 11 am tomorrow

## 2020-04-24 ENCOUNTER — Telehealth: Payer: Self-pay | Admitting: Cardiovascular Disease

## 2020-04-24 DIAGNOSIS — K921 Melena: Secondary | ICD-10-CM | POA: Diagnosis not present

## 2020-04-24 NOTE — Telephone Encounter (Signed)
Returned call to patient and answered some of his questions. He is also going to call [employer] and verify with them he is transmitting. Patient knows if he still needs our help to call Monday and he can come into the office for help.

## 2020-04-24 NOTE — Telephone Encounter (Signed)
New message:    Patient calling stating some one keeps calling. Please call patient

## 2020-04-24 NOTE — Telephone Encounter (Signed)
Patient called and said he will not be home by 11:00. He will call us back when he is home from the Deal[place]

## 2020-04-27 ENCOUNTER — Encounter: Payer: Self-pay | Admitting: Physical Medicine & Rehabilitation

## 2020-04-27 ENCOUNTER — Other Ambulatory Visit: Payer: Medicare Other | Admitting: *Deleted

## 2020-04-27 ENCOUNTER — Telehealth: Payer: Self-pay | Admitting: Cardiovascular Disease

## 2020-04-27 ENCOUNTER — Encounter: Payer: Medicare Other | Attending: Physical Medicine & Rehabilitation | Admitting: Physical Medicine & Rehabilitation

## 2020-04-27 ENCOUNTER — Encounter (INDEPENDENT_AMBULATORY_CARE_PROVIDER_SITE_OTHER): Payer: Medicare Other

## 2020-04-27 ENCOUNTER — Other Ambulatory Visit: Payer: Self-pay

## 2020-04-27 VITALS — BP 159/78 | HR 71 | Temp 98.8°F | Ht 72.0 in | Wt 230.0 lb

## 2020-04-27 DIAGNOSIS — I4819 Other persistent atrial fibrillation: Secondary | ICD-10-CM

## 2020-04-27 DIAGNOSIS — R55 Syncope and collapse: Secondary | ICD-10-CM

## 2020-04-27 DIAGNOSIS — Z9181 History of falling: Secondary | ICD-10-CM

## 2020-04-27 DIAGNOSIS — R269 Unspecified abnormalities of gait and mobility: Secondary | ICD-10-CM | POA: Diagnosis not present

## 2020-04-27 DIAGNOSIS — R931 Abnormal findings on diagnostic imaging of heart and coronary circulation: Secondary | ICD-10-CM | POA: Diagnosis not present

## 2020-04-27 DIAGNOSIS — I10 Essential (primary) hypertension: Secondary | ICD-10-CM

## 2020-04-27 DIAGNOSIS — R5381 Other malaise: Secondary | ICD-10-CM

## 2020-04-27 DIAGNOSIS — I5032 Chronic diastolic (congestive) heart failure: Secondary | ICD-10-CM

## 2020-04-27 DIAGNOSIS — I77810 Thoracic aortic ectasia: Secondary | ICD-10-CM | POA: Diagnosis not present

## 2020-04-27 DIAGNOSIS — E876 Hypokalemia: Secondary | ICD-10-CM | POA: Diagnosis not present

## 2020-04-27 DIAGNOSIS — D649 Anemia, unspecified: Secondary | ICD-10-CM | POA: Diagnosis not present

## 2020-04-27 DIAGNOSIS — Z79899 Other long term (current) drug therapy: Secondary | ICD-10-CM

## 2020-04-27 LAB — MAGNESIUM: Magnesium: 1.8 mg/dL (ref 1.6–2.3)

## 2020-04-27 NOTE — Telephone Encounter (Signed)
Patient said he has an appointment to see Dr. Posey Pronto today at 10:40 today. He is having a hard time putting on his monitor and would like someone to help him put on the temporary monitor.  He will be bringing his whole kit with him

## 2020-04-27 NOTE — Progress Notes (Signed)
Subjective:    Patient ID: Darryl Diaz, male    DOB: 1941-03-23, 79 y.o.   MRN: [id number]  HPI Right-handed male with history of hypertension, hyperlipidemia, depression,  tobacco abuse, atrial fibrillation, bradycardia arrest December 2020, GI bleed secondary to Mallory-Weiss tear, OSA presents for follow up for debility.  Since discharge, patient went to hospital x2 due to gastritis and Afib.  He continues to follow up with Cards. He saw his PCP. He was found to blood in stool.  BP is elevated, but states it is normally better controlled. He recently purchased a home cuff. Weights have been stable. He taking diuretic when his shoes feel tight. He has been taking all of his medications.   Pain Inventory Average Pain 3 Pain Right Now 0 My pain is n/a  In the last 24 hours, has pain interfered with the following? General activity 3 Relation with others 0 Enjoyment of life 0 What TIME of day is your pain at its worst? night Sleep (in general) NA  Pain is worse with: some activites Pain improves with: medication Relief from Meds: 5  walk without assistance walk with assistance use a walker how many minutes can you walk? 10-15 ability to climb steps?  yes do you drive?  yes  retired Do you have any goals in this area?  yes  bladder control problems  hospital follow up  hospital follow up    Family History  Problem Relation Age of Onset  . Heart disease Mother 62  . Hypertension Mother   . Arthritis Mother   . Heart failure Father 33  . Stroke Maternal Aunt   . Heart failure Sister   . Colon cancer Neg Hx   . Esophageal cancer Neg Hx   . Stomach cancer Neg Hx   . Rectal cancer Neg Hx    Social History   Socioeconomic History  . Marital status: Widowed    Spouse name: Not on file  . Number of children: 2  . Years of education: college  . Highest education level: Not on file  Occupational History  . Occupation: retired  Tobacco Use  . Smoking  status: Former Smoker    Types: Cigarettes  . Smokeless tobacco: Never Used  . Tobacco comment:    quit 20 years  smoked on and off for 20 years  Vaping Use  . Vaping Use: Never used  Substance and Sexual Activity  . Alcohol use: Yes    Comment: 2 drinks per week  . Drug use: No  . Sexual activity: Not Currently  Other Topics Concern  . Not on file  Social History Narrative   The patient is divorced w/ 1 son 1 daughter   He is a retired Regulatory affairs officer, he lived in [place] but [employer] including windows on the world in the [place] room in [place]   He moved to [place] to be near his sister who lives in [place] alcoholic member of AA   2 caffeinated beverages daily prior smoker no drugs   Social Determinants of Radio broadcast assistant Strain: Not on [employer] Insecurity: Not on file  Transportation Needs: Not on file  Physical Activity: Not on file  Stress: Not on file  Social Connections: Not on file   Past Surgical History:  Procedure Laterality Date  . ANKLE SURGERY Left 2014   had rods put in   . BIOPSY  02/28/2019   Procedure: BIOPSY;  Surgeon: Milus Banister, MD;  Location: [facility] ENDOSCOPY;  Service: Endoscopy;;  . CARDIOVERSION  02/06/2020  . CARDIOVERSION N/A 02/06/2020   Procedure: CARDIOVERSION;  Surgeon: Werner Lean, MD;  Location[facility] ENDOSCOPY;  Service: Cardiovascular;  Laterality: N/A;  . CARDIOVERSION N/A 02/24/2020   Procedure: CARDIOVERSION;  Surgeon: Werner Lean, MD;  Location: [place] ENDOSCOPY;  Service: Cardiovascular;  Laterality: N/A;  . [place]  . ESOPHAGOGASTRODUODENOSCOPY (EGD) WITH PROPOFOL N/A 02/28/2019   Procedure: ESOPHAGOGASTRODUODENOSCOPY (EGD) WITH PROPOFOL;  Surgeon: Milus Banister, MD;  Location: [facility] ENDOSCOPY;  Service: Endoscopy;  Laterality: N/A;  . HIP ARTHROPLASTY Left 04/01/2016   Procedure: ARTHROPLASTY BIPOLAR HIP (HEMIARTHROPLASTY);   Surgeon: Paralee Cancel, MD;  Location: [place];  Service: Orthopedics;  Laterality: Left;  . HIP CLOSED REDUCTION Left 04/30/2016   Procedure: CLOSED REDUCTION HIP;  Surgeon: Wylene Simmer, MD;  Location: [place];  Service: Orthopedics;  Laterality: Left;  . TONSILLECTOMY    . 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 Anemia   . 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 Bradycardia    low 20's  . Bradycardic cardiac arrest ([place])   . Cardiac arrest ([place]) 04/20/2019   12 min CPR with epi  . Chronic diastolic CHF (congestive heart failure) ([place])   . Fall at home 03/28/2016  . Fatty liver   . Gastritis   . GERD (gastroesophageal reflux disease)   . H/O seasonal allergies   . History of GI bleed   . Hx of adenomatous colonic polyps 08/10/2017  . Hyperlipidemia   . Hypertension   . Hypotension   . Insomnia   . 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 Mallory-Weiss tear   . OA (osteoarthritis)    hands  . OSA (obstructive sleep apnea)    No longer uses CPAP  . Persistent atrial fibrillation with rapid ventricular response ([place]) 02/05/2020  . Recurrent syncope   . 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(HCC)    BP (!) 159/78   Pulse 71   Temp 98.8 F (37.1 C)   Ht 6' (1.829 m)   Wt 230 lb (104.3 kg)   SpO2 100%   BMI 31.19 kg/m   Opioid Risk Score:   Fall Risk Score:  `1  Depression screen PHQ 2/9  Depression screen [facility] 2/9 04/27/2020 04/08/2019  Decreased Interest 0 0  Down, Depressed, Hopeless 0 0  PHQ - 2 Score 0 0  Altered sleeping 0 -  Tired, decreased energy 0 -  Change in appetite 0 -  Feeling bad or failure about yourself  0 -  Trouble concentrating 0 -  Moving slowly or fidgety/restless 0 -  Suicidal thoughts 0 -  PHQ-9  Score 0 -    Review of Systems  Respiratory: Negative for shortness of breath.   Cardiovascular: Positive for leg swelling.  Gastrointestinal: Positive for blood in stool.  Genitourinary:       Incontinence  Musculoskeletal: Positive for back pain and gait problem. Negative for arthralgias and myalgias.  Neurological: Negative for weakness and numbness.  Psychiatric/Behavioral: Negative for sleep disturbance.  All other systems reviewed and are negative.     Objective:   Physical Exam  Constitutional:  No distress . Vital signs reviewed. HENT: Normocephalic.  Atraumatic. Eyes: EOMI. No discharge. Cardiovascular: No JVD.   Respiratory: Normal effort.  No stridor.   GI: Non-distended.   Skin: Warm and dry.  Intact. Psych: Normal mood.  Normal behavior. Gait: WNL Musc: LE edema.  No tenderness in extremities. 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  1. Debility secondary to atrial fibrillation with RVR/multimedical  Cont HEP  2.  PAF.    Cont meds  Cont follow up with Cards  3.  Hypertension.   Elevated, however, states normally better  Encourage follow up with home blood pressure cuff  4.  Chronic diastolic dysfunction.    Encouraged weight checks  Cont follow up with Cards  May need to increase diuretic intake based on edema  5. Gait abnormality  Cont Rolator  Cont HEP

## 2020-04-27 NOTE — Telephone Encounter (Signed)
Patient scheduled and monitor applied 04/27/2020.

## 2020-04-30 ENCOUNTER — Telehealth: Payer: Self-pay | Admitting: *Deleted

## 2020-04-30 NOTE — Telephone Encounter (Signed)
No answer on home phone number.  Voicemail on cell phone is full

## 2020-04-30 NOTE — Telephone Encounter (Signed)
-----   Message from Charlie Pitter, [place] sent at 04/30/2020 12:49 PM EST ----- Hi triage, I am out of the office and Mickel Baas is covering my inbox. Agree with adding Mag level at time of f/u OV. In the meantime can we please have him increase his MagOx to 400mg  twice daily instead of once daily? (Please check to make sure he had started it at all). For some reason it's listed on his MAR twice -recently started earlier this month. He was supposed to have very close f/u with Vin earlier this month to see what his BP was running on midodrine but no-showed for that appointment. Can you please find out how his home numbers are running? If they are all consistently over 140 on the top number would drop midodrine back to twice a day (waking hours, like first thing in AM then after lunch). Keep f/u in [name] with Nicki Reaper. Thank you!

## 2020-05-04 MED ORDER — MAGNESIUM OXIDE 400 MG PO CAPS: 400.0000 mg | ORAL_CAPSULE | Freq: Two times a day (BID) | ORAL | 11 refills | Status: DC

## 2020-05-04 NOTE — Telephone Encounter (Signed)
Spoke with patient and reviewed advice from [place], [place]. He states he thinks he has stopped the magnesium because his blood level is normal. I advised that the blood level is within the normal parameters but that Dayna advises he take 400 mg twice daily to maintain normal level. He states he is writing down this information. He reports that he has a BP cuff at home but has not started using it. Agrees to start monitoring BP 1-2 hours after taking medications daily and to call back in 1 week to report. I explained the reason for monitoring the BP and he verbalized understanding. Patient is agreeable with plan and will call to report BP readings to triage in 1 week or sooner if he has concerns. He thanked me for the call.

## 2020-05-04 NOTE — Telephone Encounter (Signed)
Thank you so much, Sharyn Lull! If he could give [place] even a few days of readings later this week, like by Wednesday-Thursday, that would be great, just to make sure he's not overshooting his BP since we didn't get to see him in close f/u like we'd requested. Thank you! [name]

## 2020-05-07 ENCOUNTER — Telehealth: Payer: Self-pay

## 2020-05-07 NOTE — Telephone Encounter (Signed)
Called patient to determine if he has obtained any BP readings. He states he came to our office this morning to get help with learning to use his cuff. He was assisted by two of our nurses who reported BP 155/78, pulse 89. He states he will continue to monitor and call back next week to report additional readings. He thanked me for the call.

## 2020-05-07 NOTE — Telephone Encounter (Signed)
Pt walked in requesting assistance with his new BP cuff as he was seen by Melina Copa, PA on 04/10/2020 and prescribed Midodrine 2.5mg  tid which he is now taking. Pt is to monitor BP daily and has purchased a new cuff but needs assistance with operation.  Pt instructed on how to turn machine on, placement of cuff and best position for taking BP.  Today's BP via pt's BP machine is 155/78 with HR of 89.  Pt verbalizes understanding of all instructions provided and will begin taking daily BP as instructed by provider.  Pt thanked Therapist, sports for assistance.

## 2020-05-11 ENCOUNTER — Telehealth: Payer: Self-pay | Admitting: Physician Assistant

## 2020-05-11 NOTE — Telephone Encounter (Signed)
Patient notified

## 2020-05-11 NOTE — Telephone Encounter (Signed)
I am satisfied with these readings and would not make any changes based on them. Keep f/u as planned later this month. Thx!

## 2020-05-11 NOTE — Telephone Encounter (Signed)
See recent ov note from Melina Copa, [facility].

## 2020-05-11 NOTE — Telephone Encounter (Signed)
Pt c/o BP issue: STAT if pt c/o blurred vision, one-sided weakness or slurred speech  1. What are your last 5 BP readings?  05/07/20: 155/78 HR 89 (done at our office) 05/08/20: 106/55 HR 105 05/09/20: 121/86 HR 86 05/10/20: 111/71 HR 84 05/11/20: 124/66 HR 83  2. Are you having any other symptoms (ex. Dizziness, headache, blurred vision, passed out)? No  3. What is your BP issue? Pt was calling to give [place] his BP readings. He said the office was concerned about how his medication (midodrine (PROAMATINE) 2.5 MG tablet) may have been affecting his BP and HR

## 2020-05-14 ENCOUNTER — Encounter: Payer: Self-pay | Admitting: Gastroenterology

## 2020-05-14 ENCOUNTER — Ambulatory Visit (INDEPENDENT_AMBULATORY_CARE_PROVIDER_SITE_OTHER): Payer: Medicare Other | Admitting: Gastroenterology

## 2020-05-14 ENCOUNTER — Other Ambulatory Visit: Payer: Self-pay

## 2020-05-14 ENCOUNTER — Other Ambulatory Visit (INDEPENDENT_AMBULATORY_CARE_PROVIDER_SITE_OTHER): Payer: Medicare Other

## 2020-05-14 VITALS — BP 146/70 | HR 73 | Ht 72.0 in | Wt 225.2 lb

## 2020-05-14 DIAGNOSIS — D649 Anemia, unspecified: Secondary | ICD-10-CM | POA: Diagnosis not present

## 2020-05-14 DIAGNOSIS — R195 Other fecal abnormalities: Secondary | ICD-10-CM | POA: Diagnosis not present

## 2020-05-14 DIAGNOSIS — Z7901 Long term (current) use of anticoagulants: Secondary | ICD-10-CM | POA: Diagnosis not present

## 2020-05-14 HISTORY — DX: Other fecal abnormalities: R19.5

## 2020-05-14 LAB — CBC WITH DIFFERENTIAL/PLATELET
Basophils Absolute: 0 10*3/uL (ref 0.0–0.1)
Basophils Relative: 0.8 % (ref 0.0–3.0)
Eosinophils Absolute: 0.1 10*3/uL (ref 0.0–0.7)
Eosinophils Relative: 2.6 % (ref 0.0–5.0)
HCT: 30.2 % — ABNORMAL LOW (ref 39.0–52.0)
Hemoglobin: 10.4 g/dL — ABNORMAL LOW (ref 13.0–17.0)
Lymphocytes Relative: 18.7 % (ref 12.0–46.0)
Lymphs Abs: 0.8 10*3/uL (ref 0.7–4.0)
MCHC: 34.4 g/dL (ref 30.0–36.0)
MCV: 91.6 fl (ref 78.0–100.0)
Monocytes Absolute: 0.7 10*3/uL (ref 0.1–1.0)
Monocytes Relative: 17.3 % — ABNORMAL HIGH (ref 3.0–12.0)
Neutro Abs: 2.6 10*3/uL (ref 1.4–7.7)
Neutrophils Relative %: 60.6 % (ref 43.0–77.0)
Platelets: 181 10*3/uL (ref 150.0–400.0)
RBC: 3.3 Mil/uL — ABNORMAL LOW (ref 4.22–5.81)
RDW: 17.8 % — ABNORMAL HIGH (ref 11.5–15.5)
WBC: 4.3 10*3/uL (ref 4.0–10.5)

## 2020-05-14 NOTE — Progress Notes (Signed)
05/14/2020 Darryl Diaz [id number] Nov 22, 1940   HISTORY OF PRESENT ILLNESS: This is a 80 year old male who is a patient of Dr. Celesta Aver.  He has multiple medical problems as listed below.  He is actually currently undergoing some cardiac work-up and has some type of event monitor in place for evaluation of issues with hypotension and syncope/near syncope recently.  Was also placed on midodrine in December.  He tells me that he was in the hospital for 20+ days back in November for cardiac issues.  Nonetheless, he is here today for evaluation regarding Hemoccult positive stools and anemia.  He has had issues with this in the past as well.  This is in the setting of anticoagulation with Eliquis.  He had colonoscopy in April 2019 at which time he was found to have 2 polyps that were removed and were tubular adenomas.  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  He then had EGD in October 2020 by Dr. 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  Biopsies showed reactive gastropathy, negative for H. pylori.  He has been maintained on pantoprazole 40 mg daily.  In regards to his labs it appears that over the past few months his hemoglobin has been stable between 10.5 and 11 g.  On December 3 had a reading of 9.4 g.  MCV is normal.  Iron studies and B12 levels were normal.  He has also been maintained on ferrous sulfate 325 mg twice daily.  He denies any signs of overt GI bleeding.  He does not see red blood in his stools and he says that his stools have not been black in color.  In fact he does not have any GI complaints at this time.  In December TIBC was 276, serum iron 104, iron saturation 38, vitamin B12 633, ferritin 149.   Past Medical History:  Diagnosis Date  . Alcoholism ([place])    in remission following wife's death  .  Anemia   . 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 Bradycardia    low 20's  . Bradycardic cardiac arrest ([place])   . Cardiac arrest ([place]) 04/20/2019   12 min CPR with epi  . Chronic diastolic CHF (congestive heart failure) ([place])   . Fall at home 03/28/2016  . Fatty liver   . Gastritis   . GERD (gastroesophageal reflux disease)   . H/O seasonal allergies   . History of GI bleed   . Hx of adenomatous colonic polyps 08/10/2017  . Hyperlipidemia   . Hypertension   . Hypotension   . Insomnia   . 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 Mallory-Weiss tear   . OA (osteoarthritis)    hands  . OSA (obstructive sleep apnea)    No longer uses CPAP  . Persistent atrial fibrillation with rapid ventricular response ([place]) 02/05/2020  . Recurrent syncope   . 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 ANKLE SURGERY Left 2014   had rods put in   . BIOPSY  02/28/2019   Procedure: BIOPSY;  Surgeon: Milus Banister, MD;  Location: [facility] ENDOSCOPY;  Service: Endoscopy;;  . CARDIOVERSION  02/06/2020  . CARDIOVERSION N/A 02/06/2020   Procedure: CARDIOVERSION;  Surgeon: Werner Lean, MD;  Location: [facility] ENDOSCOPY;  Service: Cardiovascular;  Laterality: N/A;  . CARDIOVERSION N/A 02/24/2020   Procedure: CARDIOVERSION;  Surgeon: Werner Lean, MD;  Location: [place];  Service: Cardiovascular;  Laterality: N/A;  . [place]  . ESOPHAGOGASTRODUODENOSCOPY (EGD) WITH PROPOFOL N/A 02/28/2019   Procedure: ESOPHAGOGASTRODUODENOSCOPY (EGD) WITH PROPOFOL;  Surgeon: Milus Banister, MD;  Location: [facility] ENDOSCOPY;  Service: Endoscopy;  Laterality: N/A;  . HIP ARTHROPLASTY Left 04/01/2016   Procedure: ARTHROPLASTY BIPOLAR HIP (HEMIARTHROPLASTY);  Surgeon: Paralee Cancel, MD;  Location: [place];  Service: Orthopedics;  Laterality: Left;  . HIP CLOSED REDUCTION Left 04/30/2016   Procedure: CLOSED REDUCTION HIP;  Surgeon: Wylene Simmer,  MD;  Location: [place];  Service: Orthopedics;  Laterality: Left;  . TONSILLECTOMY    . TOTAL HIP ARTHROPLASTY Right 10/15/2019   Procedure: TOTAL HIP ARTHROPLASTY ANTERIOR APPROACH;  Surgeon: Paralee Cancel, MD;  Location: [facility];  Service: Orthopedics;  Laterality: Right;  70 mins    reports that he has quit smoking. His smoking use included cigarettes. He has never used smokeless tobacco. He reports current alcohol use of about 7.0 standard drinks of alcohol per week. He reports that he does not use drugs. family history includes Arthritis in his mother; Heart disease (age of onset: 68) in his mother; Heart failure in his sister; Heart failure (age of onset: 46) in his father; Hypertension in his mother; Stroke in his maternal aunt. Allergies  Allergen Reactions  . Penicillins Other (See Comments)    Tolerated Cephalosporin Date: 10/15/19. Allergy is from childhood Did it involve swelling of the face/tongue/throat, SOB, or low BP? Unknown Did it involve sudden or severe rash/hives, skin peeling, or any reaction on the inside of your mouth or nose? Unknown Did you need to seek medical attention at a hospital or doctor's office? Unknown When did it last happen?childhood reaction If all above answers are "NO", may proceed with cephalosporin use.      Outpatient Encounter Medications as of 05/14/2020  Medication Sig  . acetaminophen (TYLENOL) 325 MG tablet Take 2 tablets (650 mg total) by mouth every 4 (four) hours as needed for headache or mild pain.  Marland Kitchen amiodarone (PACERONE) 200 MG tablet Take 1 tablet (200 mg total) by mouth daily.  Marland Kitchen apixaban (ELIQUIS) 5 MG TABS tablet Take 1 tablet (5 mg total) by mouth 2 (two) times daily.  Marland Kitchen atorvastatin (LIPITOR) 20 MG tablet Take 1 tablet (20 mg total) by mouth daily.  Marland Kitchen escitalopram (LEXAPRO) 10 MG tablet Take 1 tablet (10 mg total) by mouth daily.  . folic acid (FOLVITE) 1 MG tablet Take 1 tablet (1 mg total) by mouth daily.  . furosemide (LASIX)  40 MG tablet Take 1 tablet (40 mg total) by mouth daily as needed for fluid (weight gain).  Marland Kitchen HYDROcodone-acetaminophen (NORCO/VICODIN) 5-325 MG tablet Take 1-2 tablets by mouth every 4 (four) hours as needed.  . loratadine (CLARITIN) 10 MG tablet Take 1 tablet (10 mg total) by mouth daily.  . Magnesium Oxide 400 MG CAPS Take 1 capsule (400 mg total) by mouth 2 (two) times daily.  . midodrine (PROAMATINE) 2.5 MG tablet Take 1 tablet (2.5 mg total) by mouth 3 (three) times daily with meals.  . Multiple Vitamins-Minerals (CENTRUM SILVER 50+MEN) TABS Take 1 tablet by mouth daily with breakfast.  . nitroGLYCERIN (NITROSTAT) 0.4 MG SL tablet Place 1 tablet (0.4 mg total) under the tongue every 5 (five) minutes x 3 doses as needed for chest pain.  . pantoprazole (  PROTONIX) 40 MG tablet Take 1 tablet (40 mg total) by mouth daily.  . potassium chloride SA (KLOR-CON) 20 MEQ tablet Take 40 mEq by mouth daily.  . tamsulosin (FLOMAX) 0.4 MG CAPS capsule Take 1 capsule (0.4 mg total) by mouth every evening.  . traZODone (DESYREL) 50 MG tablet Take 1 tablet (50 mg total) by mouth at bedtime.  . ferrous sulfate 325 (65 FE) MG tablet Take 1 tablet (325 mg total) by mouth 2 (two) times daily with a meal.   Facility-Administered Encounter Medications as of 05/14/2020  Medication  . metoprolol tartrate (LOPRESSOR) injection     REVIEW OF SYSTEMS  : All other systems reviewed and negative except where noted in the History of Present Illness.   PHYSICAL EXAM: BP (!) 146/70   Pulse 73   Ht 6' (1.829 m)   Wt 225 lb 3.2 oz (102.2 kg)   SpO2 98%   BMI 30.54 kg/m  General: Well developed white male in no acute distress Head: Normocephalic and atraumatic Eyes:  Sclerae anicteric, conjunctiva pink. Ears: Normal auditory acuity Lungs: Clear throughout to auscultation; no W/R/R. Heart: Regular rate and rhythm; no M/R/G. Abdomen: Soft, non-distended.  BS present.  Non-tender. Musculoskeletal: Symmetrical with no  gross deformities  Skin: No lesions on visible extremities Extremities:  Edema and chronic skin changes noted in B/L LEs Neurological: Alert oriented x 4, grossly non-focal Psychological:  Alert and cooperative. Normal mood and affect  ASSESSMENT AND PLAN: *Normocytic anemia with heme positive stools: This is in the setting of chronic anticoagulation with Eliquis for history of atrial fibrillation.  This is not the first time he has had issues with anemia or Hemoccult positive stools.  He underwent colonoscopy in 2019 and EGD in 2020 for similar issues.  Had gastritis, had colonic AVMs.  Either 1 of those could be contributing to his issues again.  He had 1 hemoglobin read last month that was below his "normal" recently.  His iron studies are normal on twice daily ferrous sulfate.  I discussed with the patient the options of proceeding with repeat procedures including EGD and possible colonoscopy versus rechecking his hemoglobin today and if stable or improved then continue close monitoring.  He has no signs of overt GI bleeding.  He does not have any GI complaints.  Is currently undergoing some cardiac work-up with an event monitor of some sort.  He understands the options and agreed to recheck CBC today and if stable would like to proceed with monitoring for now.   CC:  Tisovec, [name], MD

## 2020-05-14 NOTE — Patient Instructions (Signed)
If you are age 79 or older, your body mass index should be between 23-30. Your Body mass index is 30.54 kg/m. If this is out of the aforementioned range listed, please consider follow up with your Primary Care Provider.  If you are age 22 or younger, your body mass index should be between 19-25. Your Body mass index is 30.54 kg/m. If this is out of the aformentioned range listed, please consider follow up with your Primary Care Provider.   Your provider has requested that you go to the basement level for lab work before leaving today. Press "B" on the elevator. The lab is located at the first door on the left as you exit the elevator.  Due to recent changes in healthcare laws, you may see the results of your imaging and laboratory studies on MyChart before your provider has had a chance to review them.  We understand that in some cases there may be results that are confusing or concerning to you. Not all laboratory results come back in the same time frame and the provider may be waiting for multiple results in order to interpret others.  Please give [place] 48 hours in order for your provider to thoroughly review all the results before contacting the office for clarification of your results.   Thank you for choosing me and [facility].  Alonza Bogus, PA-C

## 2020-05-20 ENCOUNTER — Telehealth: Payer: Medicare Other | Admitting: Physician Assistant

## 2020-05-21 ENCOUNTER — Ambulatory Visit: Payer: Medicare Other | Admitting: Physician Assistant

## 2020-06-02 NOTE — Progress Notes (Signed)
Cardiology Office Note:    Date:  06/03/2020   ID:  Darryl Diaz, DOB October 29, 1940, MRN [id number]  PCP:  Darryl Pao, MD  [facility] Cardiologist:  Darryl Mocha, MD   [facility] Electrophysiologist:  Darryl Axe, MD   Referring MD: Darryl Pao, MD   Chief Complaint:  Follow-up (AFib, CHF, ortho BP)    Patient Profile:    Darryl Diaz is a 80 y.o. male with:   Paroxysmal atrial fibrillation   A/c held in 08/2018 due to posthemorrhagic anemia 2/2 fall  Amio started during admit w MW tear, AF w RVR in 02/2019>>DCd due to brady in 04/2019  Admx 9/21 w syncope, AF w RVR>>DCCV>>ERAF  Flecainide >> DCCV>>ERAF >>Amiodarone (CCB, BB DCd)  Will need PPM if recurrent brady; although not ideal candidate due to infection/wound care issues   Admx 11/21: AF w RVR in setting of Salmonella gastroenteritis >> Amiodarone adjusted>>NSR  Symptomatic bradycardia  admx 04/2019 >> Amiodarone, Diltiazem, Metoprolol DC'd  Eval by EP 1/21 (Dr. Caryl Diaz) - ?CCB toxicity related to excessive grapefruit juice; no pacer needed Heart failure with preserved ejection fraction   Hypertension   Hyperlipidemia   Hx of UGI bleed  s/p MW tear in 02/2019  Hx of ETOH abuse  Hepatic cirrhosis   Sleep apnea   Admitted w salmonella gastroenteritis in 11/21 c/b AKI, AF w RVR, falls  Frequent falls   Prior CV studies:   Event monitor 04/2020 1. The basic rhythm is normal sinus with an average HR of 74 bpm 2. No atrial fibrillation or flutter 3. No high-grade heart block or pathologic pauses 4. There are occasional PVC's and rare supraventricular beats without sustained arrhythmias  Myoview 02/23/20  Normal EF, normal perfusion; low risk   Limited Echocardiogram 02/23/20 EF 60-65, no RWMA, apical RV HK, normal RVSF, mild LAE, trivial MR, dilated aortic root (42 mm), dilated ascending aorta (39 mm)  Echocardiogram 02/06/20 EF 60-65, no RWMA, mild hyperkinesis of RV  apex, low normal RVSF, mild LAE, mild eccentric MR, mild to mod AV sclerosis w/o AS, asc Aorta 40 mm, Ao root 39 , RVSP 36.5    History of Present Illness:    [name] was seen by Darryl Copa, PA-C in 12/21 after a recent admission to the hospital with salmonella gastroenteritis c/b AKI, AF w RVR and frequent falls. He was in NSR at [place] and [facility] reviewed with Dr. Caryl Diaz.  He is not felt to be a great candidate for a pacer given recent hx of infections as well as falls.  He is not a candidate for sotalol due to bradycardia, dofetilide due to baseline QT and electrolyte disturbances.  Amiodarone is not a great choice given his liver issues, but it was ultimately decided to continue this.  His dose was reduced.  An event monitor was obtained and did not demonstrate recurrent Afib. He was started on midodrine due to drops in his BP on ortho VS.   He has an appt with EP APP in 06/2019 and Dr. Burt Diaz in 07/2019.  Of note he saw GI recently with Heme + anemia.  His Hgb has remained stable and the plan currently is to continue to monitor.    He returns for f/u.  He is here alone.  Overall, he has been doing well since last seen.  He has not had any issues with balance.  He has not had any lightheadedness with standing.  He has not had any syncope.  He has not had chest discomfort, orthopnea.  He has chronic leg swelling without change.  He has not had to take much Lasix.  He usually takes Lasix when his legs are more swollen.  He is using a walker to get around.  He notes that he used to have to split up his trips to [employer] into 2 days due to shortness of breath.  He is now able to complete his trip all in 1 day.       Past Medical History:  Diagnosis Date  . Alcoholism (Darryl Diaz)    in remission following wife's death  . Anemia   . 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 Bradycardia    low 20's  . Bradycardic cardiac arrest ([place])   . Cardiac arrest ([place]) 04/20/2019   12 min CPR with epi  . Chronic diastolic  CHF (congestive heart failure) ([place])   . Fall at home 03/28/2016  . Fatty liver   . Gastritis   . GERD (gastroesophageal reflux disease)   . H/O seasonal allergies   . History of GI bleed   . Hx of adenomatous colonic polyps 08/10/2017  . Hyperlipidemia   . Hypertension   . Hypotension   . Insomnia   . 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 Mallory-Weiss tear   . OA (osteoarthritis)    hands  . OSA (obstructive sleep apnea)    No longer uses CPAP  . Persistent atrial fibrillation with rapid ventricular response ([place]) 02/05/2020  . Recurrent syncope   . 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(HCC)     Current Medications: Current Meds  Medication Sig  . acetaminophen (TYLENOL) 325 MG tablet Take 2 tablets (650 mg total) by mouth every 4 (four) hours as needed for headache or mild pain.  Marland Kitchen amiodarone (PACERONE) 200 MG tablet Take 1 tablet (200 mg total) by mouth daily.  Marland Kitchen apixaban (ELIQUIS) 5 MG TABS tablet Take 1 tablet (5 mg total) by mouth 2 (two) times daily.  Marland Kitchen atorvastatin (LIPITOR) 20 MG tablet Take 1 tablet (20 mg total) by mouth daily.  Marland Kitchen escitalopram (LEXAPRO) 10 MG tablet Take 1 tablet (10 mg total) by mouth daily.  . folic acid (FOLVITE) 1 MG tablet Take 1 tablet (1 mg total) by mouth daily.  . furosemide (LASIX) 40 MG tablet Take 1 tablet (40 mg total) by mouth daily as needed for fluid (weight gain).  Marland Kitchen HYDROcodone-acetaminophen (NORCO/VICODIN) 5-325 MG tablet Take 1-2 tablets by mouth every 4 (four) hours as needed.  . loratadine (CLARITIN) 10 MG tablet Take 1 tablet (10 mg total) by mouth daily.  . Magnesium Oxide 400 MG CAPS Take 1 capsule (400 mg total) by mouth 2 (two) times daily.  . midodrine (PROAMATINE) 2.5 MG tablet Take 1 tablet (2.5 mg total) by mouth 3 (three) times daily with meals.  . Multiple Vitamins-Minerals (CENTRUM SILVER 50+MEN) TABS Take 1 tablet by mouth daily with breakfast.  . nitroGLYCERIN (NITROSTAT) 0.4 MG SL tablet Place 1 tablet (0.4 mg  total) under the tongue every 5 (five) minutes x 3 doses as needed for chest pain.  . pantoprazole (PROTONIX) 40 MG tablet Take 1 tablet (40 mg total) by mouth daily.  . potassium chloride SA (KLOR-CON) 20 MEQ tablet Take 40 mEq by mouth daily.  . tamsulosin (FLOMAX) 0.4 MG CAPS capsule Take 1 capsule (0.4 mg total) by mouth every evening.  . traZODone (DESYREL) 50 MG tablet Take 1 tablet (50 mg total) by mouth  at bedtime.     Allergies:   Penicillins   Social History   Tobacco Use  . Smoking status: Former Smoker    Types: Cigarettes  . Smokeless tobacco: Never Used  . Tobacco comment:    quit 20 years  smoked on and off for 20 years  Vaping Use  . Vaping Use: Never used  Substance Use Topics  . Alcohol use: Yes    Alcohol/week: 7.0 standard drinks    Types: 7 Glasses of wine per week  . Drug use: No     Family Hx: The patient's family history includes Arthritis in his mother; Heart disease (age of onset: 54) in his mother; Heart failure in his sister; Heart failure (age of onset: 64) in his father; Hypertension in his mother; Stroke in his maternal aunt. There is no history of Colon cancer, Esophageal cancer, Stomach cancer, or Rectal cancer.  ROS   EKGs/Labs/Other Test Reviewed:    EKG:  EKG is not ordered today.  The ekg ordered today demonstrates n/a  Recent Labs: 02/06/2020: TSH 1.764 02/22/2020: B Natriuretic Peptide 266.2 03/16/2020: ALT 15 04/10/2020: BUN 13; Creatinine, Ser 1.09; Potassium 4.5; Sodium 137 04/27/2020: Magnesium 1.8 05/14/2020: Hemoglobin 10.4; Platelets 181.0   Recent Lipid Panel Lab Results  Component Value Date/Time   CHOL 81 02/28/2019 03:14 AM   TRIG 27 02/28/2019 03:14 AM   HDL 47 02/28/2019 03:14 AM   CHOLHDL 1.7 02/28/2019 03:14 AM   LDLCALC 29 02/28/2019 03:14 AM      Risk Assessment/Calculations:    CHA2DS2-VASc Score = 4  This indicates a 4.8% annual risk of stroke. The patient's score is based upon: CHF History: Yes HTN  History: Yes Diabetes History: No Stroke History: No Vascular Disease History: No Age Score: 2 Gender Score: 0     Physical Exam:    VS:  BP (!) 158/70   Pulse 73   Ht 6' (1.829 m)   Wt 227 lb 6.4 oz (103.1 kg)   SpO2 99%   BMI 30.84 kg/m     Wt Readings from Last 3 Encounters:  06/03/20 227 lb 6.4 oz (103.1 kg)  05/14/20 225 lb 3.2 oz (102.2 kg)  04/27/20 230 lb (104.3 kg)     Constitutional:      Appearance: Healthy appearance. Not in distress.  Neck:     Vascular: No JVR.  Pulmonary:     Effort: Pulmonary effort is normal.     Breath sounds: No wheezing. No rales.  Cardiovascular:     Normal rate. Regular rhythm. Normal S1. Normal S2.     Murmurs: There is no murmur.  Edema:    Ankle: bilateral 1+ edema of the ankle. Abdominal:     Palpations: Abdomen is soft.  Skin:    General: Skin is warm and dry.  Neurological:     General: No focal deficit present.     Mental Status: Alert and oriented to person, place and time.     Cranial Nerves: Cranial nerves are intact.       ASSESSMENT & PLAN:    1. PAF (paroxysmal atrial fibrillation) ([place]) 2. Tachycardia-bradycardia syndrome ([place]) 3. Long term current use of amiodarone 4. Anticoagulant long-term use I have reviewed his records for this visit.  He has had several admissions to the hospital in the past year with rapid atrial fibrillation.  He has had issues with syncope associated with this.  He is also been admitted with symptomatic bradycardia and had his AV nodal  blocking agents stopped.  In September, he was placed back on amiodarone after he failed cardioversion.  He had recurrent atrial fibrillation during his admission in November for gastroenteritis but did revert back to sinus rhythm prior to discharge.  He saw Dayna last month for follow-up and he was maintaining sinus rhythm.  A follow-up event monitor demonstrated sinus rhythm and no significant pauses.  After review with Dr. Caryl Diaz, his amiodarone was  reduced to 200 mg a day.  He does have follow-up with the EP next month.  Overall, he seems to be fairly stable today.  By exam, he is maintaining sinus rhythm.  Continue current dose of amiodarone, Apixaban (weight >60 kg, creatinine <1.5).  Obtain follow-up CMET, TSH, hemoglobin today.  Keep follow-up with Dr. Burt Diaz in March 2022.  5. Chronic heart failure with preserved ejection fraction ([place]) Echo 10/21 with EF 60-65.  He has chronic leg edema.  Given his issues with orthostasis, we will continue him on furosemide as needed.  I advised him to weigh himself daily and take furosemide if his weight increases more than 3 pounds in a day.  I have also asked him to use compression stockings.  6. Hypokalemia 7. Hypomagnesemia He has been on replacement therapy.  Obtain follow-up CMET, magnesium level today.  8. Essential hypertension Blood pressure was somewhat elevated when he first came in today.  However, his orthostatic vital signs did demonstrate a significant drop from 146/76 lying>> 132/73 sitting>> 123/70 standing (136/60 after 5 minutes).  His heart rate increased from 66>> 75.  He still has a blood pressure drop from lying to standing but his standing blood pressure is much better than it was in December when he saw Dayna.  We may have to accept a higher blood pressure when he is lying.  I have asked him to try to raise the head of his bed.  I have also asked him to use compression stockings.  I have asked him to contact us if his blood pressure starts to increase after wearing compression stockings.  We may be able to back off on the Midodrine somewhat at that point.  9. Mild anemia He recently saw GI with heme positive anemia.  His hemoglobins have remained stable.  As he remains on Apixaban, I will obtain a follow-up CBC today.     Dispo:  Return for Routine Follow Up, w/ Dr. Burt Diaz in March as planned.   Medication Adjustments/Labs and Tests Ordered: Current medicines are reviewed at  length with the patient today.  Concerns regarding medicines are outlined above.  Tests Ordered: Orders Placed This Encounter  Procedures  . CBC  . Comprehensive metabolic panel  . Magnesium  . TSH   Medication Changes: No orders of the defined types were placed in this encounter.   Signed, Richardson Dopp, PA-C  06/03/2020 12:25 PM    [facility] [place], [place], [place]  [address] Phone: [phone]; Fax: [phone]

## 2020-06-03 ENCOUNTER — Encounter: Payer: Self-pay | Admitting: Physician Assistant

## 2020-06-03 ENCOUNTER — Ambulatory Visit (INDEPENDENT_AMBULATORY_CARE_PROVIDER_SITE_OTHER): Payer: PRIVATE HEALTH INSURANCE | Admitting: Physician Assistant

## 2020-06-03 ENCOUNTER — Other Ambulatory Visit: Payer: Self-pay

## 2020-06-03 VITALS — BP 158/70 | HR 73 | Ht 72.0 in | Wt 227.4 lb

## 2020-06-03 DIAGNOSIS — E876 Hypokalemia: Secondary | ICD-10-CM | POA: Diagnosis not present

## 2020-06-03 DIAGNOSIS — I495 Sick sinus syndrome: Secondary | ICD-10-CM

## 2020-06-03 DIAGNOSIS — I480 Paroxysmal atrial fibrillation: Secondary | ICD-10-CM | POA: Diagnosis not present

## 2020-06-03 DIAGNOSIS — Z79899 Other long term (current) drug therapy: Secondary | ICD-10-CM

## 2020-06-03 DIAGNOSIS — I5032 Chronic diastolic (congestive) heart failure: Secondary | ICD-10-CM | POA: Diagnosis not present

## 2020-06-03 DIAGNOSIS — D649 Anemia, unspecified: Secondary | ICD-10-CM

## 2020-06-03 DIAGNOSIS — I10 Essential (primary) hypertension: Secondary | ICD-10-CM

## 2020-06-03 DIAGNOSIS — Z7901 Long term (current) use of anticoagulants: Secondary | ICD-10-CM | POA: Diagnosis not present

## 2020-06-03 NOTE — Patient Instructions (Addendum)
Medication Instructions:  Your physician recommends that you continue on your current medications as directed. Please refer to the Current Medication list given to you today.  *If you need a refill on your cardiac medications before your next appointment, please call your pharmacy*  Lab Work: You will have labs drawn today: TSH/CBC/CMET/Magnesium  If you have labs (blood work) drawn today and your tests are completely normal, you will receive your results only by: Marland Kitchen MyChart Message (if you have MyChart) OR . A paper copy in the mail If you have any lab test that is abnormal or we need to change your treatment, we will call you to review the results.  Testing/Procedures: None ordered today  Follow-Up: Keep follow up with Tommye Standard, PA-C on 06/16/20 at 9:15AM and Sherren Mocha, MD on 08/03/20 at 1:40PM

## 2020-06-04 LAB — CBC
Hematocrit: 31 % — ABNORMAL LOW (ref 37.5–51.0)
Hemoglobin: 10.2 g/dL — ABNORMAL LOW (ref 13.0–17.7)
MCH: 30.6 pg (ref 26.6–33.0)
MCHC: 32.9 g/dL (ref 31.5–35.7)
MCV: 93 fL (ref 79–97)
Platelets: 163 10*3/uL (ref 150–450)
RBC: 3.33 x10E6/uL — ABNORMAL LOW (ref 4.14–5.80)
RDW: 14.6 % (ref 11.6–15.4)
WBC: 3.8 10*3/uL (ref 3.4–10.8)

## 2020-06-04 LAB — COMPREHENSIVE METABOLIC PANEL
ALT: 29 IU/L (ref 0–44)
AST: 46 IU/L — ABNORMAL HIGH (ref 0–40)
Albumin/Globulin Ratio: 1.5 (ref 1.2–2.2)
Albumin: 3.4 g/dL — ABNORMAL LOW (ref 3.7–4.7)
Alkaline Phosphatase: 129 IU/L — ABNORMAL HIGH (ref 44–121)
BUN/Creatinine Ratio: 12 (ref 10–24)
BUN: 12 mg/dL (ref 8–27)
Bilirubin Total: 0.5 mg/dL (ref 0.0–1.2)
CO2: 22 mmol/L (ref 20–29)
Calcium: 8.7 mg/dL (ref 8.6–10.2)
Chloride: 98 mmol/L (ref 96–106)
Creatinine, Ser: 1.04 mg/dL (ref 0.76–1.27)
GFR calc Af Amer: 79 mL/min/{1.73_m2} (ref >59)
GFR calc non Af Amer: 68 mL/min/{1.73_m2} (ref >59)
Globulin, Total: 2.3 g/dL (ref 1.5–4.5)
Glucose: 101 mg/dL — ABNORMAL HIGH (ref 65–99)
Potassium: 3.9 mmol/L (ref 3.5–5.2)
Sodium: 133 mmol/L — ABNORMAL LOW (ref 134–144)
Total Protein: 5.7 g/dL — ABNORMAL LOW (ref 6.0–8.5)

## 2020-06-04 LAB — MAGNESIUM: Magnesium: 1.4 mg/dL — ABNORMAL LOW (ref 1.6–2.3)

## 2020-06-04 LAB — TSH: TSH: 4.45 u[IU]/mL (ref 0.450–4.500)

## 2020-06-04 NOTE — Addendum Note (Signed)
Addended byKathlen Mody, Nicki Reaper T on: 06/04/2020 09:02 AM   Modules accepted: Orders

## 2020-06-14 NOTE — Progress Notes (Signed)
Cardiology Office Note Date:  06/14/2020  Patient ID:  Darryl Diaz, Darryl Diaz 01-[name]-1941, MRN [id number] PCP:  [name], MD  Cardiologist:  Dr. Burt Knack Electrophysiologist: Dr. Caryl Comes   Chief Complaint: planned follow up  History of Present Illness: Darryl Diaz is a 80 y.o. 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  He saw Kathleen Argue, PA 06/03/20, his note lays out well his history: "He has had several admissions to the hospital in the past year with rapid atrial fibrillation.  He has had issues with syncope associated with this.  He is also been admitted with symptomatic bradycardia and had his AV nodal blocking agents stopped.  In September, he was placed back on amiodarone after he failed cardioversion.  He had recurrent atrial fibrillation during his admission in November for gastroenteritis but did revert back to sinus rhythm prior to discharge.  He saw Dayna last month for follow-up and he was maintaining sinus rhythm.  A follow-up event monitor demonstrated sinus rhythm and no significant pauses.  After review with Dr. Caryl Comes, his amiodarone was reduced to 200 mg a day."  He was doing pretty well at that time, planned for f/u labs and to keep EP f/u as scheduled. Discussed likely need to tolerate higher resting BPs given his orthostatic issues. Instructed pt to weigh daily and given PRN lasix instructions   TODAY He feels like he is doing OK. " I feel OK". He denies any CP, palpitations or cardiac awareness. My heart has been good" He also denies any orthostatic symptoms, no dizziness, no near syncope or syncope. He is taking the midodrine with meals. He denies rest SOB, no nocturnal symptoms, sleeps with one pillow.  Minimal DOE, though not particularly active, walks with a walker, has a bad ankle. Denies bleeding or signs of bleeding  He is edematous today, though he says this is  much improved from the last few visits and has been into his thighs previously.  Today he says is very close to his baseline.  Took lasix yesterday and plans to take today again when he gets home. He is not taking K+ at all, not  Even when using the lasix.  He will use the lasix usually for 2 days a couple times a month. He tells me his LLE is ALWAYS swollen after a ankle injury and surgery about 5 years ago.  Had labs done today via [facility].   AFib Hx  ? A/c held in 08/2018 due to posthemorrhagic anemia 2/2 fall ? Amio started during admit w MW tear, AF w RVR in 02/2019>>DCd due to brady in 04/2019 ? Admx 9/21 w syncope, AF w RVR>>DCCV>>ERAF  Flecainide >> DCCV>>ERAF >>Amiodarone (CCB, BB DCd)  Will need PPM if recurrent brady; although not ideal candidate due to infection/wound care issues  ? Admx 11/21: AF w RVR in setting of Salmonella gastroenteritis >> Amiodarone adjusted>>NSR  Symptomatic bradycardia ? admx 04/2019 >> Amiodarone, Diltiazem, Metoprolol DC'd  Eval by EP 1/21 (Dr. Caryl Comes) - ?CCB toxicity related to excessive grapefruit juice; no pacer needed Heart failure with preserved ejection fraction   not a candidate for sotalol due to bradycardia, dofetilide due to baseline QT and electrolyte disturbances.  Amiodarone is not a great choice given his liver issues, but it was ultimately decided to continue this  Past Medical History:  Diagnosis Date  . Alcoholism ([place])    in remission following wife's  death  . Anemia   . 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 Bradycardia    low 20's  . Bradycardic cardiac arrest ([place])   . Cardiac arrest ([place]) 04/20/2019   12 min CPR with epi  . Chronic diastolic CHF (congestive heart failure) ([place])   . Fall at home 03/28/2016  . Fatty liver   . Gastritis   . GERD (gastroesophageal reflux disease)   . H/O seasonal allergies   . History of GI bleed   . Hx of adenomatous colonic polyps 08/10/2017  . Hyperlipidemia   . Hypertension   .  Hypotension   . Insomnia   . 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 Mallory-Weiss tear   . OA (osteoarthritis)    hands  . OSA (obstructive sleep apnea)    No longer uses CPAP  . Persistent atrial fibrillation with rapid ventricular response ([place]) 02/05/2020  . Recurrent syncope   . 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[facility])     Past Surgical History:  Procedure Laterality Date  . ANKLE SURGERY Left 2014   had rods put in   . BIOPSY  02/28/2019   Procedure: BIOPSY;  Surgeon: Milus Banister, MD;  Location: [facility] ENDOSCOPY;  Service: Endoscopy;;  . CARDIOVERSION  02/06/2020  . CARDIOVERSION N/A 02/06/2020   Procedure: CARDIOVERSION;  Surgeon: Werner Lean, MD;  Location: [facility] ENDOSCOPY;  Service: Cardiovascular;  Laterality: N/A;  . CARDIOVERSION N/A 02/24/2020   Procedure: CARDIOVERSION;  Surgeon: Werner Lean, MD;  Location: [facility] ENDOSCOPY;  Service: Cardiovascular;  Laterality: N/A;  . [place]  . ESOPHAGOGASTRODUODENOSCOPY (EGD) WITH PROPOFOL N/A 02/28/2019   Procedure: ESOPHAGOGASTRODUODENOSCOPY (EGD) WITH PROPOFOL;  Surgeon: Milus Banister, MD;  Location: [facility] ENDOSCOPY;  Service: Endoscopy;  Laterality: N/A;  . HIP ARTHROPLASTY Left 04/01/2016   Procedure: ARTHROPLASTY BIPOLAR HIP (HEMIARTHROPLASTY);  Surgeon: Paralee Cancel, MD;  Location: [place];  Service: Orthopedics;  Laterality: Left;  . HIP CLOSED REDUCTION Left 04/30/2016   Procedure: CLOSED REDUCTION HIP;  Surgeon: Wylene Simmer, MD;  Location: [place];  Service: Orthopedics;  Laterality: Left;  . TONSILLECTOMY    . TOTAL HIP ARTHROPLASTY Right 10/15/2019   Procedure: TOTAL HIP ARTHROPLASTY ANTERIOR APPROACH;  Surgeon: Paralee Cancel, MD;  Location: [facility];  Service: Orthopedics;  Laterality: Right;  70 mins    Current Outpatient Medications  Medication Sig Dispense Refill  . acetaminophen (TYLENOL) 325 MG tablet Take 2 tablets (650 mg total) by mouth every 4 (four) hours as needed for  headache or mild pain.    Marland Kitchen amiodarone (PACERONE) 200 MG tablet Take 1 tablet (200 mg total) by mouth daily. 90 tablet 3  . apixaban (ELIQUIS) 5 MG TABS tablet Take 1 tablet (5 mg total) by mouth 2 (two) times daily. 60 tablet 0  . atorvastatin (LIPITOR) 20 MG tablet Take 1 tablet (20 mg total) by mouth daily. 30 tablet 0  . escitalopram (LEXAPRO) 10 MG tablet Take 1 tablet (10 mg total) by mouth daily. 30 tablet 0  . ferrous sulfate 325 (65 FE) MG tablet Take 1 tablet (325 mg total) by mouth 2 (two) times daily with a meal. 60 tablet 0  . folic acid (FOLVITE) 1 MG tablet Take 1 tablet (1 mg total) by mouth daily. 30 tablet 0  . furosemide (LASIX) 40 MG tablet Take 1 tablet (40 mg total) by mouth daily as needed for fluid (weight gain). 30 tablet 0  . HYDROcodone-acetaminophen (NORCO/VICODIN) 5-325 MG  tablet Take 1-2 tablets by mouth every 4 (four) hours as needed. 10 tablet 0  . loratadine (CLARITIN) 10 MG tablet Take 1 tablet (10 mg total) by mouth daily. 30 tablet 0  . Magnesium Oxide 400 MG CAPS Take 1 capsule (400 mg total) by mouth 2 (two) times daily. 60 capsule 11  . midodrine (PROAMATINE) 2.5 MG tablet Take 1 tablet (2.5 mg total) by mouth 3 (three) times daily with meals. 90 tablet 3  . Multiple Vitamins-Minerals (CENTRUM SILVER 50+MEN) TABS Take 1 tablet by mouth daily with breakfast.    . nitroGLYCERIN (NITROSTAT) 0.4 MG SL tablet Place 1 tablet (0.4 mg total) under the tongue every 5 (five) minutes x 3 doses as needed for chest pain. 30 tablet 12  . pantoprazole (PROTONIX) 40 MG tablet Take 1 tablet (40 mg total) by mouth daily. 30 tablet 0  . potassium chloride SA (KLOR-CON) 20 MEQ tablet Take 40 mEq by mouth daily.    . tamsulosin (FLOMAX) 0.4 MG CAPS capsule Take 1 capsule (0.4 mg total) by mouth every evening. 30 capsule 0  . traZODone (DESYREL) 50 MG tablet Take 1 tablet (50 mg total) by mouth at bedtime. 30 tablet 0   No current facility-administered medications for this visit.    Facility-Administered Medications Ordered in Other Visits  Medication Dose Route Frequency Provider Last Rate Last Admin  . metoprolol tartrate (LOPRESSOR) injection    Anesthesia Intra-op Mariea Clonts, CRNA   5 mg at 02/27/19 1318    Allergies:   Penicillins   Social History:  The patient  reports that he has quit smoking. His smoking use included cigarettes. He has never used smokeless tobacco. He reports current alcohol use of about 7.0 standard drinks of alcohol per week. He reports that he does not use drugs.   Family History:  The patient's family history includes Arthritis in his mother; Heart disease (age of onset: 75) in his mother; Heart failure in his sister; Heart failure (age of onset: 84) in his father; Hypertension in his mother; Stroke in his maternal aunt.  ROS:  Please see the history of present illness.    All other systems are reviewed and otherwise negative.   PHYSICAL EXAM:  VS:  There were no vitals taken for this visit. BMI: There is no height or weight on file to calculate BMI. Well nourished, well developed, in no acute distress HEENT: normocephalic, atraumatic Neck: no JVD, carotid bruits or masses Cardiac:  RRR; no significant murmurs, no rubs, or gallops Lungs:  CTA b/l, no wheezing, rhonchi or rales Abd: soft, nontender MS: no deformity or atrophy Ext: 1-2+ pitting edema to just below the knee L>R with some erythematous skin changes, not warm, superficial scratches, no wounds otherwise Skin: warm and dry, no rash Neuro:  No gross deficits appreciated Psych: euthymic mood, full affect    EKG:  Not done today   Event monitor 04/2020 1. The basic rhythm is normal sinus with an average HR of 74 bpm 2. No atrial fibrillation or flutter 3. No high-grade heart block or pathologic pauses 4. There are occasional PVC's and rare supraventricular beats without sustained arrhythmias  Myoview 02/23/20  Normal EF, normal perfusion; low risk    Limited Echocardiogram 02/23/20 EF 60-65, no RWMA, apical RV HK, normal RVSF, mild LAE, trivial MR, dilated aortic root (42 mm), dilated ascending aorta (39 mm)  Echocardiogram 02/06/20 EF 60-65, no RWMA, mild hyperkinesis of RV apex, low normal RVSF, mild LAE, mild eccentric  MR, mild to mod AV sclerosis w/o AS, asc Aorta 40 mm, Ao root 39 , RVSP 36.5    Recent Labs: 02/22/2020: B Natriuretic Peptide 266.2 06/03/2020: ALT 29; BUN 12; Creatinine, Ser 1.04; Hemoglobin 10.2; Magnesium 1.4; Platelets 163; Potassium 3.9; Sodium 133; TSH 4.450  No results found for requested labs within last 8760 hours.   Estimated Creatinine Clearance: 72.7 mL/min (by C-G formula based on SCr of 1.04 mg/dL).   Wt Readings from Last 3 Encounters:  06/03/20 227 lb 6.4 oz (103.1 kg)  05/14/20 225 lb 3.2 oz (102.2 kg)  04/27/20 230 lb (104.3 kg)     Other studies reviewed: Additional studies/records reviewed today include: summarized above  ASSESSMENT AND PLAN:  1. Paroxysmal AFib 2. Tachy-brady     Well discussed above/by previous providers     Poor PPM candidate     amio only AAD option, though not ideal     amio labs done last month     CHA2DS2Vasc is 47, on eliquis, appropriately dosed     No symptoms of AF since his last hospital stay  3. HTN 4. Orthostatic hypotension     On midodrine     Denies symptoms  5. Hypokalemia 6. Hyomagnesemia     Labs today  7. Chronic CHF     HFpEF     He is edematous, though he reports much improved in general, and was a bit surprised that I though he was quite swollen.     He took lasix yesterday     I have asked him to take his K+ as well when he takes the lasix     I have asked him to take the lasix/K+ daily for 3 more days     Discussed daily weights     Discussed balance of volume management and his orthostatic issues     He denies any SOB       Disposition: F/u 6 weeks, sooner if needed  Current medicines are reviewed at length with the  patient today.  The patient did not have any concerns regarding medicines.  Venetia Night, PA-C 06/14/2020 8:39 AM     [facility] [address] [place] [address] [phone] (office)  [phone] (fax)

## 2020-06-16 ENCOUNTER — Other Ambulatory Visit: Payer: Self-pay

## 2020-06-16 ENCOUNTER — Encounter: Payer: Self-pay | Admitting: Physician Assistant

## 2020-06-16 ENCOUNTER — Other Ambulatory Visit: Payer: Medicare Other

## 2020-06-16 ENCOUNTER — Ambulatory Visit (INDEPENDENT_AMBULATORY_CARE_PROVIDER_SITE_OTHER): Payer: Medicare Other | Admitting: Physician Assistant

## 2020-06-16 VITALS — BP 104/46 | HR 80 | Ht 72.0 in | Wt 227.0 lb

## 2020-06-16 DIAGNOSIS — E876 Hypokalemia: Secondary | ICD-10-CM

## 2020-06-16 DIAGNOSIS — I951 Orthostatic hypotension: Secondary | ICD-10-CM | POA: Diagnosis not present

## 2020-06-16 DIAGNOSIS — I480 Paroxysmal atrial fibrillation: Secondary | ICD-10-CM

## 2020-06-16 DIAGNOSIS — I10 Essential (primary) hypertension: Secondary | ICD-10-CM | POA: Diagnosis not present

## 2020-06-16 DIAGNOSIS — I5043 Acute on chronic combined systolic (congestive) and diastolic (congestive) heart failure: Secondary | ICD-10-CM | POA: Diagnosis not present

## 2020-06-16 DIAGNOSIS — I5032 Chronic diastolic (congestive) heart failure: Secondary | ICD-10-CM

## 2020-06-16 LAB — BASIC METABOLIC PANEL
BUN/Creatinine Ratio: 17 (ref 10–24)
BUN: 16 mg/dL (ref 8–27)
CO2: 22 mmol/L (ref 20–29)
Calcium: 9 mg/dL (ref 8.6–10.2)
Chloride: 100 mmol/L (ref 96–106)
Creatinine, Ser: 0.92 mg/dL (ref 0.76–1.27)
GFR calc Af Amer: 92 mL/min/{1.73_m2} (ref >59)
GFR calc non Af Amer: 79 mL/min/{1.73_m2} (ref >59)
Glucose: 84 mg/dL (ref 65–99)
Potassium: 4.4 mmol/L (ref 3.5–5.2)
Sodium: 137 mmol/L (ref 134–144)

## 2020-06-16 LAB — MAGNESIUM: Magnesium: 1.7 mg/dL (ref 1.6–2.3)

## 2020-06-16 MED ORDER — POTASSIUM CHLORIDE CRYS ER 20 MEQ PO TBCR: 40.0000 meq | EXTENDED_RELEASE_TABLET | Freq: Every day | ORAL | 1 refills | Status: DC | PRN

## 2020-06-16 NOTE — Patient Instructions (Addendum)
Medication Instructions:   START TAKING POTASSIUM 40 MEQ ONLY WHEN YOU TAKE FUROSEMIDE.  *If you need a refill on your cardiac medications before your next appointment, please call your pharmacy*   Lab Work: [place]   If you have labs (blood work) drawn today and your tests are completely normal, you will receive your results only by: Marland Kitchen MyChart Message (if you have MyChart) OR . A paper copy in the mail If you have any lab test that is abnormal or we need to change your treatment, we will call you to review the results.   Testing/Procedures: NONE ORDERED  TODAY   Follow-Up: At [facility], you and your health needs are our priority.  As part of our continuing mission to provide you with exceptional heart care, we have created designated Provider Care Teams.  These Care Teams include your primary Cardiologist (physician) and Advanced Practice Providers (APPs -  Physician Assistants and Nurse Practitioners) who all work together to provide you with the care you need, when you need it.  We recommend signing up for the patient portal called "MyChart".  Sign up information is provided on this After Visit Summary.  MyChart is used to connect with patients for Virtual Visits (Telemedicine).  Patients are able to view lab/test results, encounter notes, upcoming appointments, etc.  Non-urgent messages can be sent to your provider as well.   To learn more about what you can do with MyChart, go to [email or web address].    Your next appointment:   6 week(s)  The format for your next appointment:   In Person  Provider:   Tommye Standard, PA-C   Other Instructions  WEIGHT DAILY AND ELEVATE YOUR FEET WHILE SEATED

## 2020-06-25 DIAGNOSIS — E7800 Pure hypercholesterolemia, unspecified: Secondary | ICD-10-CM | POA: Diagnosis not present

## 2020-06-25 DIAGNOSIS — Z125 Encounter for screening for malignant neoplasm of prostate: Secondary | ICD-10-CM | POA: Diagnosis not present

## 2020-06-25 DIAGNOSIS — R7301 Impaired fasting glucose: Secondary | ICD-10-CM | POA: Diagnosis not present

## 2020-06-30 DIAGNOSIS — I4891 Unspecified atrial fibrillation: Secondary | ICD-10-CM | POA: Diagnosis not present

## 2020-06-30 DIAGNOSIS — F321 Major depressive disorder, single episode, moderate: Secondary | ICD-10-CM | POA: Diagnosis not present

## 2020-06-30 DIAGNOSIS — E441 Mild protein-calorie malnutrition: Secondary | ICD-10-CM | POA: Diagnosis not present

## 2020-06-30 DIAGNOSIS — R7301 Impaired fasting glucose: Secondary | ICD-10-CM | POA: Diagnosis not present

## 2020-06-30 DIAGNOSIS — I5032 Chronic diastolic (congestive) heart failure: Secondary | ICD-10-CM | POA: Diagnosis not present

## 2020-06-30 DIAGNOSIS — I10 Essential (primary) hypertension: Secondary | ICD-10-CM | POA: Diagnosis not present

## 2020-06-30 DIAGNOSIS — D500 Iron deficiency anemia secondary to blood loss (chronic): Secondary | ICD-10-CM | POA: Diagnosis not present

## 2020-06-30 DIAGNOSIS — E7800 Pure hypercholesterolemia, unspecified: Secondary | ICD-10-CM | POA: Diagnosis not present

## 2020-06-30 DIAGNOSIS — N401 Enlarged prostate with lower urinary tract symptoms: Secondary | ICD-10-CM | POA: Diagnosis not present

## 2020-06-30 DIAGNOSIS — I700 Atherosclerosis of aorta: Secondary | ICD-10-CM | POA: Diagnosis not present

## 2020-06-30 DIAGNOSIS — Z0000 Encounter for general adult medical examination without abnormal findings: Secondary | ICD-10-CM | POA: Diagnosis not present

## 2020-06-30 DIAGNOSIS — Z7901 Long term (current) use of anticoagulants: Secondary | ICD-10-CM | POA: Diagnosis not present

## 2020-07-08 ENCOUNTER — Encounter (HOSPITAL_COMMUNITY): Payer: Self-pay

## 2020-07-08 ENCOUNTER — Emergency Department (HOSPITAL_COMMUNITY)
Admission: EM | Admit: 2020-07-08 | Discharge: 2020-07-08 | Disposition: A | Discharge status: Home / Self Care | Payer: Medicare Other | Attending: Emergency Medicine | Admitting: Emergency Medicine

## 2020-07-08 ENCOUNTER — Emergency Department (HOSPITAL_COMMUNITY): Payer: Medicare Other

## 2020-07-08 ENCOUNTER — Other Ambulatory Visit: Payer: Self-pay

## 2020-07-08 ENCOUNTER — Encounter: Payer: Self-pay | Admitting: Physician Assistant

## 2020-07-08 DIAGNOSIS — S0101XA Laceration without foreign body of scalp, initial encounter: Secondary | ICD-10-CM | POA: Diagnosis not present

## 2020-07-08 DIAGNOSIS — I110 Hypertensive heart disease with heart failure: Secondary | ICD-10-CM | POA: Insufficient documentation

## 2020-07-08 DIAGNOSIS — Z23 Encounter for immunization: Secondary | ICD-10-CM | POA: Insufficient documentation

## 2020-07-08 DIAGNOSIS — Z7901 Long term (current) use of anticoagulants: Secondary | ICD-10-CM | POA: Diagnosis not present

## 2020-07-08 DIAGNOSIS — I959 Hypotension, unspecified: Secondary | ICD-10-CM | POA: Diagnosis not present

## 2020-07-08 DIAGNOSIS — Y9389 Activity, other specified: Secondary | ICD-10-CM | POA: Diagnosis not present

## 2020-07-08 DIAGNOSIS — R9431 Abnormal electrocardiogram [ECG] [EKG]: Secondary | ICD-10-CM | POA: Diagnosis not present

## 2020-07-08 DIAGNOSIS — S0003XA Contusion of scalp, initial encounter: Secondary | ICD-10-CM | POA: Diagnosis not present

## 2020-07-08 DIAGNOSIS — W010XXA Fall on same level from slipping, tripping and stumbling without subsequent striking against object, initial encounter: Secondary | ICD-10-CM | POA: Insufficient documentation

## 2020-07-08 DIAGNOSIS — G9389 Other specified disorders of brain: Secondary | ICD-10-CM | POA: Diagnosis not present

## 2020-07-08 DIAGNOSIS — I509 Heart failure, unspecified: Secondary | ICD-10-CM | POA: Insufficient documentation

## 2020-07-08 DIAGNOSIS — I6381 Other cerebral infarction due to occlusion or stenosis of small artery: Secondary | ICD-10-CM | POA: Diagnosis not present

## 2020-07-08 DIAGNOSIS — W19XXXA Unspecified fall, initial encounter: Secondary | ICD-10-CM | POA: Diagnosis not present

## 2020-07-08 DIAGNOSIS — S0990XA Unspecified injury of head, initial encounter: Secondary | ICD-10-CM

## 2020-07-08 DIAGNOSIS — S0993XA Unspecified injury of face, initial encounter: Secondary | ICD-10-CM | POA: Diagnosis not present

## 2020-07-08 DIAGNOSIS — I4891 Unspecified atrial fibrillation: Secondary | ICD-10-CM | POA: Diagnosis not present

## 2020-07-08 DIAGNOSIS — M47812 Spondylosis without myelopathy or radiculopathy, cervical region: Secondary | ICD-10-CM | POA: Diagnosis not present

## 2020-07-08 DIAGNOSIS — I10 Essential (primary) hypertension: Secondary | ICD-10-CM | POA: Diagnosis not present

## 2020-07-08 DIAGNOSIS — S199XXA Unspecified injury of neck, initial encounter: Secondary | ICD-10-CM | POA: Diagnosis not present

## 2020-07-08 LAB — I-STAT CHEM 8, ED
BUN: 16 mg/dL (ref 8–23)
Calcium, Ion: 0.97 mmol/L — ABNORMAL LOW (ref 1.15–1.40)
Chloride: 92 mmol/L — ABNORMAL LOW (ref 98–111)
Creatinine, Ser: 1.4 mg/dL — ABNORMAL HIGH (ref 0.61–1.24)
Glucose, Bld: 90 mg/dL (ref 70–99)
HCT: 30 % — ABNORMAL LOW (ref 39.0–52.0)
Hemoglobin: 10.2 g/dL — ABNORMAL LOW (ref 13.0–17.0)
Potassium: 4.3 mmol/L (ref 3.5–5.1)
Sodium: 125 mmol/L — ABNORMAL LOW (ref 135–145)
TCO2: 22 mmol/L (ref 22–32)

## 2020-07-08 MED ORDER — LIDOCAINE-EPINEPHRINE 1 %-1:100000 IJ SOLN
20.0000 mL | Freq: Once | INTRAMUSCULAR | Status: DC
  Filled 2020-07-08: qty 1

## 2020-07-08 MED ORDER — TETANUS-DIPHTH-ACELL PERTUSSIS 5-2.5-18.5 LF-MCG/0.5 IM SUSY
0.5000 mL | PREFILLED_SYRINGE | Freq: Once | INTRAMUSCULAR | Status: AC
  Administered 2020-07-08: 0.5 mL via INTRAMUSCULAR
  Filled 2020-07-08: qty 0.5

## 2020-07-08 NOTE — Progress Notes (Signed)
Level 2 Trauma. Patient not available.  Staff said chaplain ok to leave. They will page if needed later. Conard Novak, Chaplain   07/08/20 0000  Clinical Encounter Type  Visited With Patient not available  Referral From Care management  Consult/Referral To Chaplain

## 2020-07-08 NOTE — ED Notes (Signed)
Patient changed into hospital gown by Zara Chess and Junie Panning NT.

## 2020-07-08 NOTE — Discharge Instructions (Addendum)
You were evaluated in the Emergency Department and after careful evaluation, we did not find any emergent condition requiring admission or further testing in the hospital.  Your exam/testing today was overall reassuring.  CT scan shows no internal injuries.  Your stitches will need to be removed in 5 to 7 days by a healthcare professional.  Please return to the Emergency Department if you experience any worsening of your condition.  Thank you for allowing [place] to be a part of your care.

## 2020-07-08 NOTE — ED Notes (Signed)
This RN in pts room ensuring safe discharge - pt lives by himself at home and may have his sister/ brother pick him up and bring him home.   Pt states he feels he is unsteady on his feet and afraid that he would fall again.   Dr. Sedonia Small made aware.

## 2020-07-08 NOTE — ED Triage Notes (Signed)
Ground level mechanical fall, pt sustained laceration  approx 3in to left forehead area with controlled bleeding noted. Pt on blood thinners - eliquis for afib.

## 2020-07-08 NOTE — ED Notes (Signed)
C collar removed by Dr. Sedonia Small at this time.

## 2020-07-08 NOTE — ED Notes (Signed)
Patient cleaned up. Inc of urine. Peri-care performed.

## 2020-07-08 NOTE — ED Provider Notes (Signed)
[facility] Emergency Department Provider Note MRN:  [id number]  Arrival date & time: 07/08/20     Chief Complaint   Fall   History of Present Illness   Darryl Diaz is a 80 y.o. year-old male with a history of hypertension, A. fib, CHF presenting to the ED with chief complaint of fall.  Patient stumbled and fell while trying to reach for his walker, laceration to the forehead.  Denies loss of consciousness.  Denies neck or back pain, no chest pain or shortness of breath, no abdominal pain, no injuries to the arms or legs.  Arriving as a level 2 trauma given the anticoagulation.  Review of Systems  A complete 10 system review of systems was obtained and all systems are negative except as noted in the HPI and PMH.   Patient's Health History   No past medical history on file.    No family history on file.  Social History   Socioeconomic History  . Marital status: Widowed    Spouse name: Not on file  . Number of children: Not on file  . Years of education: Not on file  . Highest education level: Not on file  Occupational History  . Not on file  Tobacco Use  . Smoking status: Not on file  . Smokeless tobacco: Not on file  Substance and Sexual Activity  . Alcohol use: Not on file  . Drug use: Not on file  . Sexual activity: Not on file  Other Topics Concern  . Not on file  Social History Narrative  . Not on file   Social Determinants of Health   Financial Resource Strain: Not on file  Food Insecurity: Not on file  Transportation Needs: Not on file  Physical Activity: Not on file  Stress: Not on file  Social Connections: Not on file  Intimate Partner Violence: Not on file     Physical Exam   Vitals:   07/08/20 0245 07/08/20 0315  BP: (!) 115/49 (!) 122/49  Pulse: 75 71  Resp: 19 (!) 24  Temp:    SpO2: 100% 100%    CONSTITUTIONAL: Well-appearing, NAD NEURO:  Alert and oriented x 3, no focal deficits EYES:  eyes equal and  reactive ENT/NECK:  no LAD, no JVD CARDIO: Regular rate, well-perfused, normal S1 and S2 PULM:  CTAB no wheezing or rhonchi GI/GU:  normal bowel sounds, non-distended, non-tender MSK/SPINE:  No gross deformities, chronic swelling bilateral ankles, no C, T, or L midline spinal tenderness SKIN: Laceration to the mid forehead PSYCH:  Appropriate speech and behavior  *Additional and/or pertinent findings included in MDM below  Diagnostic and Interventional Summary    EKG Interpretation  Date/Time:    Ventricular Rate:    PR Interval:    QRS Duration:   QT Interval:    QTC Calculation:   R Axis:     Text Interpretation:        Labs Reviewed  I-STAT CHEM 8, ED - Abnormal; Notable for the following components:      Result Value   Sodium 125 (*)    Chloride 92 (*)    Creatinine, Ser 1.40 (*)    Calcium, Ion 0.97 (*)    Hemoglobin 10.2 (*)    HCT 30.0 (*)    All other components within normal limits    CT HEAD WO CONTRAST  Final Result    CT CERVICAL SPINE WO CONTRAST  Final Result      Medications  lidocaine-EPINEPHrine (XYLOCAINE W/EPI) 1 %-1:100000 (with pres) injection 20 mL (20 mLs Infiltration Handoff 07/08/20 0046)  Tdap (BOOSTRIX) injection 0.5 mL (0.5 mLs Intramuscular Given 07/08/20 0046)     Procedures  /  Critical Care .Marland KitchenLaceration Repair  Date/Time: 07/08/2020 3:16 AM Performed by: Maudie Flakes, MD Authorized by: Maudie Flakes, MD   Consent:    Consent obtained:  Verbal   Consent given by:  Patient   Risks discussed:  Infection, need for additional repair, nerve damage, poor wound healing, poor cosmetic result, pain, retained foreign body, tendon damage and vascular damage Universal protocol:    Procedure explained and questions answered to patient or proxy's satisfaction: yes     Patient identity confirmed:  Verbally with patient Anesthesia:    Anesthesia method:  Local infiltration   Local anesthetic:  Lidocaine 1% WITH epi Laceration details:     Location:  Scalp   Scalp location:  Frontal   Length (cm):  10   Depth (mm):  3 Pre-procedure details:    Preparation:  Patient was prepped and draped in usual sterile fashion Exploration:    Limited defect created (wound extended): no     Hemostasis achieved with:  Direct pressure and tied off vessels   Wound exploration: wound explored through full range of motion and entire depth of wound visualized     Contaminated: no   Treatment:    Area cleansed with:  Saline   Amount of cleaning:  Standard   Debridement:  Minimal   Layers/structures repaired:  Deep dermal/superficial fascia Deep dermal/superficial fascia:    Suture size:  5-0   Suture material:  Vicryl   Suture technique:  Figure eight   Number of sutures:  1 Skin repair:    Repair method:  Sutures   Suture size:  5-0   Suture material:  Prolene   Suture technique:  Simple interrupted   Number of sutures:  11 Approximation:    Approximation:  Close Repair type:    Repair type:  Intermediate Post-procedure details:    Dressing:  Antibiotic ointment and adhesive bandage   Procedure completion:  Tolerated well, no immediate complications Comments:     Small bleeding artery, hemostasis achieved with figure-of-eight stitch.  Skin then closed as described above.    ED Course and Medical Decision Making  I have reviewed the triage vital signs, the nursing notes, and pertinent available records from the EMR.  Listed above are laboratory and imaging tests that I personally ordered, reviewed, and interpreted and then considered in my medical decision making (see below for details).  Mechanical fall, anticoagulated, awaiting CT imaging.  Will need like repair.     CT imaging is without acute injury, lack is repaired, appropriate for discharge.  Barth Kirks. Sedonia Small, [place] [email or web address]  Final Clinical Impressions(s) / ED Diagnoses     ICD-10-CM   1.  Laceration of scalp, initial encounter  S01.01XA   2. Injury of head, initial encounter  S09.90XA   3. Anticoagulated  Z79.01     ED Discharge Orders    None       Discharge Instructions Discussed with and Provided to Patient:     Discharge Instructions     You were evaluated in the Emergency Department and after careful evaluation, we did not find any emergent condition requiring admission or further testing in the hospital.  Your exam/testing today was overall reassuring.  CT scan shows no internal injuries.  Your stitches will need to be removed in 5 to 7 days by a healthcare professional.  Please return to the Emergency Department if you experience any worsening of your condition.  Thank you for allowing [place] to be a part of your care.        Maudie Flakes, MD 07/08/20 [phone]

## 2020-07-08 NOTE — ED Notes (Signed)
Patient cleaned up and brief put on patient

## 2020-07-08 NOTE — ED Notes (Signed)
Able to speak to pts sister [name] [phone]) and will be able pick pt up. ETA 68mins to 1hr. Will wheel pt out once family arrives in lobby.

## 2020-07-12 ENCOUNTER — Other Ambulatory Visit: Payer: Self-pay | Admitting: Adult Health

## 2020-07-12 DIAGNOSIS — Z8719 Personal history of other diseases of the digestive system: Secondary | ICD-10-CM

## 2020-07-16 DIAGNOSIS — W19XXXA Unspecified fall, initial encounter: Secondary | ICD-10-CM | POA: Diagnosis not present

## 2020-07-16 DIAGNOSIS — I4891 Unspecified atrial fibrillation: Secondary | ICD-10-CM | POA: Diagnosis not present

## 2020-07-16 DIAGNOSIS — Z4802 Encounter for removal of sutures: Secondary | ICD-10-CM | POA: Diagnosis not present

## 2020-07-16 DIAGNOSIS — S0101XA Laceration without foreign body of scalp, initial encounter: Secondary | ICD-10-CM | POA: Diagnosis not present

## 2020-07-16 DIAGNOSIS — Z7901 Long term (current) use of anticoagulants: Secondary | ICD-10-CM | POA: Diagnosis not present

## 2020-07-16 DIAGNOSIS — R531 Weakness: Secondary | ICD-10-CM | POA: Diagnosis not present

## 2020-07-22 ENCOUNTER — Telehealth: Payer: Self-pay | Admitting: *Deleted

## 2020-07-22 DIAGNOSIS — F339 Major depressive disorder, recurrent, unspecified: Secondary | ICD-10-CM

## 2020-07-22 DIAGNOSIS — H43812 Vitreous degeneration, left eye: Secondary | ICD-10-CM | POA: Diagnosis not present

## 2020-07-22 DIAGNOSIS — H35362 Drusen (degenerative) of macula, left eye: Secondary | ICD-10-CM | POA: Diagnosis not present

## 2020-07-22 DIAGNOSIS — H35373 Puckering of macula, bilateral: Secondary | ICD-10-CM | POA: Diagnosis not present

## 2020-07-22 DIAGNOSIS — H31091 Other chorioretinal scars, right eye: Secondary | ICD-10-CM | POA: Diagnosis not present

## 2020-07-22 NOTE — Telephone Encounter (Signed)
Received fax from pharmacy requesting escitalopram 10 mg, #30. No mention in last clinic note. There was a notation in hospital discharge note for mood stabilization.

## 2020-07-22 NOTE — Addendum Note (Signed)
Addended by: Caro Hight on: 07/22/2020 09:47 AM   Modules accepted: Orders

## 2020-07-22 NOTE — Telephone Encounter (Signed)
Sent electronic refusal to pharmacy.

## 2020-07-22 NOTE — Telephone Encounter (Signed)
He is no longer follow up with me, so he should request from his PCP.  Thanks.

## 2020-07-23 ENCOUNTER — Other Ambulatory Visit: Payer: Self-pay

## 2020-07-23 ENCOUNTER — Ambulatory Visit (INDEPENDENT_AMBULATORY_CARE_PROVIDER_SITE_OTHER): Payer: Medicare Other | Admitting: Podiatry

## 2020-07-23 DIAGNOSIS — M79674 Pain in right toe(s): Secondary | ICD-10-CM | POA: Diagnosis not present

## 2020-07-23 DIAGNOSIS — M2042 Other hammer toe(s) (acquired), left foot: Secondary | ICD-10-CM

## 2020-07-23 DIAGNOSIS — B353 Tinea pedis: Secondary | ICD-10-CM | POA: Diagnosis not present

## 2020-07-23 DIAGNOSIS — M79675 Pain in left toe(s): Secondary | ICD-10-CM

## 2020-07-23 DIAGNOSIS — M21611 Bunion of right foot: Secondary | ICD-10-CM | POA: Diagnosis not present

## 2020-07-23 DIAGNOSIS — B351 Tinea unguium: Secondary | ICD-10-CM

## 2020-07-23 DIAGNOSIS — M2041 Other hammer toe(s) (acquired), right foot: Secondary | ICD-10-CM

## 2020-07-23 DIAGNOSIS — M2012 Hallux valgus (acquired), left foot: Secondary | ICD-10-CM | POA: Diagnosis not present

## 2020-07-23 DIAGNOSIS — M21612 Bunion of left foot: Secondary | ICD-10-CM | POA: Diagnosis not present

## 2020-07-23 DIAGNOSIS — M2011 Hallux valgus (acquired), right foot: Secondary | ICD-10-CM | POA: Diagnosis not present

## 2020-07-23 MED ORDER — SILVER SULFADIAZINE 1 % EX CREA: 1.0000 "application " | TOPICAL_CREAM | Freq: Every day | CUTANEOUS | 0 refills | Status: DC

## 2020-07-23 MED ORDER — KETOCONAZOLE 2 % EX CREA: 1.0000 "application " | TOPICAL_CREAM | Freq: Every day | CUTANEOUS | 2 refills | Status: DC

## 2020-07-24 NOTE — Progress Notes (Signed)
Cardiology Office Note Date:  07/24/2020  Patient ID:  Darryl, Diaz [birth year], MRN [id number] PCP:  [name], MD  Cardiologist:  Dr. Burt Knack Electrophysiologist: Dr. Caryl Comes   Chief Complaint:    planned follow up  History of Present Illness: Darryl Diaz is a 80 y.o. 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  He saw Kathleen Argue, PA 06/03/20, his note lays out well his history: "He has had several admissions to the hospital in the past year with rapid atrial fibrillation.  He has had issues with syncope associated with this.  He is also been admitted with symptomatic bradycardia and had his AV nodal blocking agents stopped.  In September, he was placed back on amiodarone after he failed cardioversion.  He had recurrent atrial fibrillation during his admission in November for gastroenteritis but did revert back to sinus rhythm prior to discharge.  He saw Dayna last month for follow-up and he was maintaining sinus rhythm.  A follow-up event monitor demonstrated sinus rhythm and no significant pauses.  After review with Dr. Caryl Comes, his amiodarone was reduced to 200 mg a day."  He was doing pretty well at that time, planned for f/u labs and to keep EP f/u as scheduled. Discussed likely need to tolerate higher resting BPs given his orthostatic issues. Instructed pt to weigh daily and given PRN lasix instructions   I saw him 06/16/20 He feels like he is doing OK. " I feel OK". He denies any CP, palpitations or cardiac awareness. My heart has been good" He also denies any orthostatic symptoms, no dizziness, no near syncope or syncope. He is taking the midodrine with meals. He denies rest SOB, no nocturnal symptoms, sleeps with one pillow.  Minimal DOE, though not particularly active, walks with a walker, has a bad ankle. Denies bleeding or signs of bleeding He is edematous today, though he  says this is much improved from the last few visits and has been into his thighs previously.  Today he says is very close to his baseline.  Took lasix yesterday and plans to take today again when he gets home. He is not taking K+ at all, not  Even when using the lasix.  He will use the lasix usually for 2 days a couple times a month. He tells me his LLE is ALWAYS swollen after a ankle injury and surgery about 5 years ago. He was a bit surprised that I though he was quite swollen.     He took lasix yesterday     I have asked him to take his K+ as well when he takes the lasix     I have asked him to take the lasix/K+ daily for 3 more days Planned for 6 week f/u  07/08/20 had an ER visit for fall, no syncope, head lac, CT scan showed no internal injuries  TODAY He reports that his fall was not a dizzy spell and is certain that he did not faint, but not entirley sure why he fell, was up and ambulating when he got off balance and could not stop the fall, had his walker. He recalls the entire fall, and assures me he did not faint. He denies orthostatic symptoms, no dizziness upon standing.  He again, denies any cardiac awareness, no CP, palpitations and denies SOB. He is unimpressed with his legs but did notice the blotchy rash and the 1st appointment  with the dermatologist was the 1st-2nd week of April He saw podiatry recently and gave him some ointment for his feet and thickened skin areas.  He reports taking the lasix?k+ combo about 1x/week when his shoes feel tight.  Again reminds me that his LLE is always more swollen then the R for years.  He bleed quite a bit he said when he hit his head, though none otherwise   AFib Hx  ? A/c held in 08/2018 due to posthemorrhagic anemia 2/2 fall ? Amio started during admit w MW tear, AF w RVR in 02/2019>>DCd due to brady in 04/2019 ? Admx 9/21 w syncope, AF w RVR>>DCCV>>ERAF  Flecainide >> DCCV>>ERAF >>Amiodarone (CCB, BB DCd)  Will need PPM if  recurrent brady; although not ideal candidate due to infection/wound care issues  ? Admx 11/21: AF w RVR in setting of Salmonella gastroenteritis >> Amiodarone adjusted>>NSR  Symptomatic bradycardia ? admx 04/2019 >> Amiodarone, Diltiazem, Metoprolol DC'd  Eval by EP 1/21 (Dr. Caryl Comes) - ?CCB toxicity related to excessive grapefruit juice; no pacer needed Heart failure with preserved ejection fraction   not a candidate for sotalol due to bradycardia, dofetilide due to baseline QT and electrolyte disturbances.  Amiodarone is not a great choice given his liver issues, but it was ultimately decided to continue this  Past Medical History:  Diagnosis Date  . Alcoholism ([place])    in remission following wife's death  . Anemia   . 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 Bradycardia    low 20's  . Bradycardic cardiac arrest ([place])   . Cardiac arrest ([place]) 04/20/2019   12 min CPR with epi  . Chronic diastolic CHF (congestive heart failure) ([place])   . Fall at home 03/28/2016  . Fatty liver   . Gastritis   . GERD (gastroesophageal reflux disease)   . H/O seasonal allergies   . History of GI bleed   . Hx of adenomatous colonic polyps 08/10/2017  . Hyperlipidemia   . Hypertension   . Hypotension   . Insomnia   . 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 Mallory-Weiss tear   . OA (osteoarthritis)    hands  . OSA (obstructive sleep apnea)    No longer uses CPAP  . Persistent atrial fibrillation with rapid ventricular response ([place]) 02/05/2020  . Recurrent syncope   . 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[facility])     Past Surgical History:  Procedure Laterality Date  . ANKLE SURGERY Left 2014   had rods put in   . BIOPSY  02/28/2019   Procedure: BIOPSY;  Surgeon: Milus Banister, MD;  Location[facility] END[facility]COPY;  Service: Endoscopy;;  . CARDIOVERSION  02/06/2020  . CARDIOVERSION N/A 02/06/2020   Procedure: CARDIOVERSION;  Surgeon: Werner Lean, MD;  Location: [facility] ENDOSCOPY;  Service:  Cardiovascular;  Laterality: N/A;  . CARDIOVERSION N/A 02/24/2020   Procedure: CARDIOVERSION;  Surgeon: Werner Lean, MD;  Location: [facility] ENDOSCOPY;  Service: Cardiovascular;  Laterality: N/A;  . [place]  . ESOPHAGOGASTRODUODENOSCOPY (EGD) WITH PROPOFOL N/A 02/28/2019   Procedure: ESOPHAGOGASTRODUODENOSCOPY (EGD) WITH PROPOFOL;  Surgeon: Milus Banister, MD;  Location: [facility] ENDOSCOPY;  Service: Endoscopy;  Laterality: N/A;  . HIP ARTHROPLASTY Left 04/01/2016   Procedure: ARTHROPLASTY BIPOLAR HIP (HEMIARTHROPLASTY);  Surgeon: Paralee Cancel, MD;  Location: [place];  Service: Orthopedics;  Laterality: Left;  . HIP CLOSED REDUCTION Left 04/30/2016   Procedure: CLOSED REDUCTION HIP;  Surgeon: Wylene Simmer, MD;  Location[facility]  OR;  Service: Orthopedics;  Laterality: Left;  . TONSILLECTOMY    . TOTAL HIP ARTHROPLASTY Right 10/15/2019   Procedure: TOTAL HIP ARTHROPLASTY ANTERIOR APPROACH;  Surgeon: Paralee Cancel, MD;  Location: [facility] ORS;  Service: Orthopedics;  Laterality: Right;  70 mins    Current Outpatient Medications  Medication Sig Dispense Refill  . acetaminophen (TYLENOL) 325 MG tablet Take 2 tablets (650 mg total) by mouth every 4 (four) hours as needed for headache or mild pain.    Marland Kitchen amiodarone (PACERONE) 200 MG tablet Take 1 tablet (200 mg total) by mouth daily. 90 tablet 3  . apixaban (ELIQUIS) 5 MG TABS tablet Take 1 tablet (5 mg total) by mouth 2 (two) times daily. 60 tablet 0  . atorvastatin (LIPITOR) 20 MG tablet Take 1 tablet (20 mg total) by mouth daily. 30 tablet 0  . escitalopram (LEXAPRO) 10 MG tablet Take 1 tablet (10 mg total) by mouth daily. 30 tablet 0  . ferrous sulfate 325 (65 FE) MG tablet Take 1 tablet (325 mg total) by mouth 2 (two) times daily with a meal. 60 tablet 0  . folic acid (FOLVITE) 1 MG tablet Take 1 tablet (1 mg total) by mouth daily. 30 tablet 0  . furosemide (LASIX) 40 MG tablet Take 1 tablet (40 mg total) by mouth daily as needed for  fluid (weight gain). 30 tablet 0  . ketoconazole (NIZORAL) 2 % cream Apply 1 application topically daily. To dry scaly skin on feet 60 g 2  . loratadine (CLARITIN) 10 MG tablet Take 1 tablet (10 mg total) by mouth daily. 30 tablet 0  . Magnesium Oxide 400 MG CAPS Take 1 capsule (400 mg total) by mouth 2 (two) times daily. 60 capsule 11  . midodrine (PROAMATINE) 2.5 MG tablet Take 1 tablet (2.5 mg total) by mouth 3 (three) times daily with meals. 90 tablet 3  . Multiple Vitamins-Minerals (CENTRUM SILVER 50+MEN) TABS Take 1 tablet by mouth daily with breakfast.    . nitroGLYCERIN (NITROSTAT) 0.4 MG SL tablet Place 1 tablet (0.4 mg total) under the tongue every 5 (five) minutes x 3 doses as needed for chest pain. 30 tablet 12  . pantoprazole (PROTONIX) 40 MG tablet Take 1 tablet (40 mg total) by mouth daily. 30 tablet 0  . potassium chloride SA (KLOR-CON) 20 MEQ tablet Take 2 tablets (40 mEq total) by mouth daily as needed. ONLY WHEN YOU TAKE LASIX 90 tablet 1  . silver sulfADIAZINE (SILVADENE) 1 % cream Apply 1 application topically daily. 50 g 0  . tamsulosin (FLOMAX) 0.4 MG CAPS capsule Take 1 capsule (0.4 mg total) by mouth every evening. 30 capsule 0  . traZODone (DESYREL) 50 MG tablet Take 1 tablet (50 mg total) by mouth at bedtime. 30 tablet 0   No current facility-administered medications for this visit.   Facility-Administered Medications Ordered in Other Visits  Medication Dose Route Frequency Provider Last Rate Last Admin  . metoprolol tartrate (LOPRESSOR) injection    Anesthesia Intra-op Mariea Clonts, CRNA   5 mg at 02/27/19 1318    Allergies:   Penicillins and Penicillins   Social History:  The patient  reports that he has never smoked. He has never used smokeless tobacco. He reports previous alcohol use. He reports that he does not use drugs.   Family History:  The patient's family history includes Arthritis in his mother; Heart disease (age of onset: 24) in his mother; Heart  failure in his sister; Heart failure (age  of onset: 1) in his father; Hypertension in his mother; Stroke in his maternal aunt.  ROS:  Please see the history of present illness.    All other systems are reviewed and otherwise negative.   PHYSICAL EXAM:  VS:  There were no vitals taken for this visit. BMI: There is no height or weight on file to calculate BMI. Well nourished, well developed, in no acute distress HEENT: normocephalic, atraumatic Neck: no JVD, carotid bruits or masses Cardiac:  RRR; no significant murmurs, no rubs, or gallops Lungs:  CTA b/l, no wheezing, rhonchi or rales Abd: soft, nontender MS: no deformity or atrophy Ext:  1-2+ pitting edema to just below the knee L>R with areas of marked erythematous skin changes, not warm, superficial scratches, no wounds otherwise, as well as a more diffuse blotchy/patchy flat rash upwards b/l thighs and some though l;ess erythematouos low abdomen and back.  Skin: as above Neuro:  No gross deficits appreciated Psych: euthymic mood, full affect    EKG:  Not done today 07/08/2020 personally reviewed is SR, nonspecific IVCD, 73bpm, no change   Event monitor 04/2020 1. The basic rhythm is normal sinus with an average HR of 74 bpm 2. No atrial fibrillation or flutter 3. No high-grade heart block or pathologic pauses 4. There are occasional PVC's and rare supraventricular beats without sustained arrhythmias  Myoview 02/23/20  Normal EF, normal perfusion; low risk   Limited Echocardiogram 02/23/20 EF 60-65, no RWMA, apical RV HK, normal RVSF, mild LAE, trivial MR, dilated aortic root (42 mm), dilated ascending aorta (39 mm)  Echocardiogram 02/06/20 EF 60-65, no RWMA, mild hyperkinesis of RV apex, low normal RVSF, mild LAE, mild eccentric MR, mild to mod AV sclerosis w/o AS, asc Aorta 40 mm, Ao root 39 , RVSP 36.5    Recent Labs: 02/22/2020: B Natriuretic Peptide 266.2 06/03/2020: ALT 29; Platelets 163; TSH 4.450 06/16/2020:  Magnesium 1.7 07/08/2020: BUN 16; Creatinine, Ser 1.40; Hemoglobin 10.2; Potassium 4.3; Sodium 125  No results found for requested labs within last 8760 hours.   CrCl cannot be calculated (Unknown ideal weight.).   Wt Readings from Last 3 Encounters:  07/08/20 242 lb 15.2 oz (110.2 kg)  06/16/20 227 lb (103 kg)  06/03/20 227 lb 6.4 oz (103.1 kg)     Other studies reviewed: Additional studies/records reviewed today include: summarized above  ASSESSMENT AND PLAN:  1. Paroxysmal AFib 2. Tachy-brady     Well discussed above/by previous providers     Poor PPM candidate     amio only AAD option, though not ideal     amio labs up to date     CHA37DS2Vasc is 1, on eliquis, appropriately dosed     No symptoms of AF   3. HTN 4. Orthostatic hypotension     On midodrine     Denies symptoms  As discussed above, he is adamant that his fall was not near syncope or syncope, " just got off balance" Assures me that he is not having dizzy spells  5. Hypokalemia 6. Hyomagnesemia     Las labs were OK  7. Chronic CHF     HFpEF      Legs are edematous and erythematous, I asked DOD, Dr. Meda Coffee to evaluate, likely early cellulitis of lower legs, perhaps drug reaction rash?   Recommends \\Keflex  500mg  BID for 5 days OTC hydrocortisone cream to rash areas See dermatology Med list reviewed, the patient reports the rash about 86mo, maybe less, ( I don't recall  appreciating it at his last visit), no new meds, reviewed with RPH, ? Amio,midodrine, or trazodone, though will defer to the dermatologist  Take the lasix/K+ daily until his visit next week with dr. Burt Knack. I discussed with the patient safety, and monitoring for any dizziness upon standing, and close eye on symptoms and to stand slowly, sit if any dizziness.  Disposition: Dr. Burt Knack next week as scheduled, EP in 20mo, sooner if needed  Current medicines are reviewed at length with the patient today.  The patient did not have any concerns  regarding medicines.  Venetia Night, PA-C 07/24/2020 11:28 AM     [address] [place] [address] [phone] (office)  [phone] (fax)

## 2020-07-27 NOTE — Progress Notes (Signed)
  Subjective:  Patient ID: Darryl Diaz, male    DOB: 1941/02/22,  MRN: [id number]  Chief Complaint  Patient presents with  . routine foot care    Callus and nail care    80 y.o. male returns with the above complaint. History confirmed with patient.  Callus on the plantar left hallux is the worst part.  The nails are thickened elongated and discolored and are difficult to cut.  Objective:  Physical Exam: warm, good capillary refill, no trophic changes or ulcerative lesions, normal DP and PT pulses and normal sensory exam.  Onychomycosis with subungual debris, dystrophy and elongation and discoloration is noted.  Plantar left medial hallux there is a hyperkeratosis which is tender to touch small superficial abrasion here, no signs of infection or deep ulceration.  Bilateral hallux valgus and semirigid hammertoe deformities 2 through 5.  Dry scaling skin Assessment:   1. Tinea pedis of both feet   2. Hallux valgus with bunions, left   3. Hallux valgus with bunions, right   4. Hammertoe of left foot   5. Hammertoe of right foot      Plan:  Patient was evaluated and treated and all questions answered.  Discussed the etiology and treatment options for the condition in detail with the patient. Educated patient on the topical and oral treatment options for mycotic nails. Recommended debridement of the nails today. Sharp and mechanical debridement performed of all painful and mycotic nails today. Nails debrided in length and thickness using a nail nipper and a mechanical burr to level of comfort. Discussed treatment options including appropriate shoe gear. Follow up as needed for painful nails.  Continue nonsurgical treatment for bunions and hammertoes, I recommended orthopedic or diabetic shoes, he will consider this  All symptomatic hyperkeratoses were safely debrided with a sterile #15 blade to patient's level of comfort without incident. We discussed preventative and palliative care of  these lesions including supportive and accommodative shoegear, padding, prefabricated and custom molded accommodative orthoses, use of a pumice stone and lotions/creams daily.  Silvadene cream for the small abrasion, he has been using this already  Discussed the etiology and treatment options for tinea pedis.  Discussed topical and oral treatment.  Recommended topical treatment with 2% ketoconazole cream.  This was sent to the patient's pharmacy.  Also discussed appropriate foot hygiene, use of antifungal spray such as Tinactin in shoes, as well as cleaning her foot surfaces such as showers and bathroom floors with bleach.   Return in about 3 months (around 10/23/2020) for at risk diabetic foot care.

## 2020-07-28 ENCOUNTER — Other Ambulatory Visit: Payer: Self-pay

## 2020-07-28 ENCOUNTER — Ambulatory Visit (INDEPENDENT_AMBULATORY_CARE_PROVIDER_SITE_OTHER): Payer: Medicare Other | Admitting: Physician Assistant

## 2020-07-28 ENCOUNTER — Encounter: Payer: Self-pay | Admitting: Physician Assistant

## 2020-07-28 VITALS — BP 138/64 | HR 90 | Ht 72.0 in | Wt 231.8 lb

## 2020-07-28 DIAGNOSIS — I480 Paroxysmal atrial fibrillation: Secondary | ICD-10-CM

## 2020-07-28 DIAGNOSIS — I5032 Chronic diastolic (congestive) heart failure: Secondary | ICD-10-CM

## 2020-07-28 DIAGNOSIS — I10 Essential (primary) hypertension: Secondary | ICD-10-CM

## 2020-07-28 DIAGNOSIS — I951 Orthostatic hypotension: Secondary | ICD-10-CM | POA: Diagnosis not present

## 2020-07-28 MED ORDER — CEPHALEXIN 500 MG PO CAPS: 500.0000 mg | ORAL_CAPSULE | Freq: Two times a day (BID) | ORAL | 0 refills | Status: DC

## 2020-07-28 MED ORDER — NITROGLYCERIN 0.4 MG SL SUBL: 0.4000 mg | SUBLINGUAL_TABLET | SUBLINGUAL | 12 refills | Status: AC | PRN

## 2020-07-28 NOTE — Patient Instructions (Addendum)
Medication Instructions:   TAKE KEFLEX 500 MG TWICE A DAY FOR 5 DAYS   TAKE POTASSIUM AND FUROSEMIDE (LASIX) EVERYDAY UNTIL DR CRENSHAW APPOINTMENT  *If you need a refill on your cardiac medications before your next appointment, please call your pharmacy*   Lab Work:  If you have labs (blood work) drawn today and your tests are completely normal, you will receive your results only by: Marland Kitchen MyChart Message (if you have MyChart) OR . A paper copy in the mail If you have any lab test that is abnormal or we need to change your treatment, we will call you to review the results.   Testing/Procedures: NONE ORDERED  TODAY   Follow-Up: At [facility], you and your health needs are our priority.  As part of our continuing mission to provide you with exceptional heart care, we have created designated Provider Care Teams.  These Care Teams include your primary Cardiologist (physician) and Advanced Practice Providers (APPs -  Physician Assistants and Nurse Practitioners) who all work together to provide you with the care you need, when you need it.  We recommend signing up for the patient portal called "MyChart".  Sign up information is provided on this After Visit Summary.  MyChart is used to connect with patients for Virtual Visits (Telemedicine).  Patients are able to view lab/test results, encounter notes, upcoming appointments, etc.  Non-urgent messages can be sent to your provider as well.   To learn more about what you can do with MyChart, go to [email or web address].    Your next appointment:   4 month(s)  The format for your next appointment:   In Person  Provider:   Virl Axe, MD   Other Instructions  YOU HAVE BEEN ADVISED TO USE  OVER [place]

## 2020-08-03 ENCOUNTER — Other Ambulatory Visit: Payer: Self-pay

## 2020-08-03 ENCOUNTER — Encounter: Payer: Self-pay | Admitting: Cardiovascular Disease

## 2020-08-03 ENCOUNTER — Ambulatory Visit (INDEPENDENT_AMBULATORY_CARE_PROVIDER_SITE_OTHER): Payer: Medicare Other | Admitting: Cardiovascular Disease

## 2020-08-03 VITALS — BP 138/70 | HR 77 | Ht 72.0 in | Wt 234.0 lb

## 2020-08-03 DIAGNOSIS — I951 Orthostatic hypotension: Secondary | ICD-10-CM | POA: Diagnosis not present

## 2020-08-03 DIAGNOSIS — I5043 Acute on chronic combined systolic (congestive) and diastolic (congestive) heart failure: Secondary | ICD-10-CM | POA: Diagnosis not present

## 2020-08-03 DIAGNOSIS — I495 Sick sinus syndrome: Secondary | ICD-10-CM | POA: Diagnosis not present

## 2020-08-03 DIAGNOSIS — I480 Paroxysmal atrial fibrillation: Secondary | ICD-10-CM

## 2020-08-03 DIAGNOSIS — R5381 Other malaise: Secondary | ICD-10-CM

## 2020-08-03 MED ORDER — FUROSEMIDE 40 MG PO TABS: 40.0000 mg | ORAL_TABLET | Freq: Every day | ORAL | 3 refills | Status: DC

## 2020-08-03 MED ORDER — POTASSIUM CHLORIDE CRYS ER 20 MEQ PO TBCR: 40.0000 meq | EXTENDED_RELEASE_TABLET | Freq: Every day | ORAL | 3 refills | Status: DC

## 2020-08-03 NOTE — Progress Notes (Signed)
Cardiology Office Note:    Date:  08/03/2020   ID:  SUSIE POUSSON, DOB 1940-11-17, MRN [id number]  PCP:  [name], MD   [place]  Cardiologist:  Sherren Mocha, MD  Advanced Practice Provider:  No care team member to display Electrophysiologist:  Virl Axe, MD       Referring MD: [name], MD   Chief Complaint  Patient presents with  . Coronary Artery Disease    History of Present Illness:    Darryl Diaz is a 80 y.o. male with a hx of:  Paroxysmal atrial fibrillation  ? A/c held in 08/2018 due to posthemorrhagic anemia 2/2 fall ? Amio started during admit w MW tear, AF w RVR in 02/2019>>DCd due to brady in 04/2019 ? Admx 9/21 w syncope, AF w RVR>>DCCV>>ERAF  Flecainide >> DCCV>>ERAF >>Amiodarone (CCB, BB DCd)  Will need PPM if recurrent brady; although not ideal candidate due to infection/wound care issues  ? Admx 11/21: AF w RVR in setting of Salmonella gastroenteritis >> Amiodarone adjusted>>NSR  Symptomatic bradycardia ? admx 04/2019 >> Amiodarone, Diltiazem, Metoprolol DC'd  Eval by EP 1/21 (Dr. Caryl Comes) - ?CCB toxicity related to excessive grapefruit juice; no pacer needed Heart failure with preserved ejection fraction   Hypertension   Hyperlipidemia   Hx of UGI bleed ? s/p MW tear in 02/2019  Hx of ETOH abuse  Hepatic cirrhosis   Sleep apnea   Admitted w salmonella gastroenteritis in 11/21 c/b AKI, AF w RVR, falls  Frequent falls   The patient is here with his son today.  The patient feels like he is getting along okay.  He denies orthopnea or PND.  He said no chest pain or chest pressure.  He states that he has not had any orthostatic dizziness recently.  He steadies himself when he gets up quickly.  He is having some problems with his skin as he has a rash on both legs with a good bit of itching, scheduled to see dermatology next month.  He is taking furosemide on an as-needed basis.   Past  Medical History:  Diagnosis Date  . Alcoholism ([place])    in remission following wife's death  . Anemia   . 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 Bradycardia    low 20's  . Bradycardic cardiac arrest ([place])   . Cardiac arrest ([place]) 04/20/2019   12 min CPR with epi  . Chronic diastolic CHF (congestive heart failure) ([place])   . Fall at home 03/28/2016  . Fatty liver   . Gastritis   . GERD (gastroesophageal reflux disease)   . H/O seasonal allergies   . History of GI bleed   . Hx of adenomatous colonic polyps 08/10/2017  . Hyperlipidemia   . Hypertension   . Hypotension   . Insomnia   . 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 Mallory-Weiss tear   . OA (osteoarthritis)    hands  . OSA (obstructive sleep apnea)    No longer uses CPAP  . Persistent atrial fibrillation with rapid ventricular response ([place]) 02/05/2020  . Recurrent syncope   . 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 ANKLE SURGERY Left 2014   had rods put in   . BIOPSY  02/28/2019   Procedure: BIOPSY;  Surgeon: Milus Banister, MD;  Location[facility] ENDOSCOPY;  Service: Endoscopy;;  . CARDIOVERSION  02/06/2020  .  CARDIOVERSION N/A 02/06/2020   Procedure: CARDIOVERSION;  Surgeon: Werner Lean, MD;  Location: [facility] ENDOSCOPY;  Service: Cardiovascular;  Laterality: N/A;  . CARDIOVERSION N/A 02/24/2020   Procedure: CARDIOVERSION;  Surgeon: Werner Lean, MD;  Location: [place] ENDOSCOPY;  Service: Cardiovascular;  Laterality: N/A;  . [place]  . ESOPHAGOGASTRODUODENOSCOPY (EGD) WITH PROPOFOL N/A 02/28/2019   Procedure: ESOPHAGOGASTRODUODENOSCOPY (EGD) WITH PROPOFOL;  Surgeon: Milus Banister, MD;  Location[facility] ENDOSCOPY;  Service: Endoscopy;  Laterality: N/A;  . HIP ARTHROPLASTY Left 04/01/2016   Procedure: ARTHROPLASTY BIPOLAR HIP (HEMIARTHROPLASTY);  Surgeon: Paralee Cancel, MD;  Location: [place];  Service: Orthopedics;  Laterality:  Left;  . HIP CLOSED REDUCTION Left 04/30/2016   Procedure: CLOSED REDUCTION HIP;  Surgeon: Wylene Simmer, MD;  Location: [place];  Service: Orthopedics;  Laterality: Left;  . TONSILLECTOMY    . 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 acetaminophen (TYLENOL) 325 MG tablet Take 2 tablets (650 mg total) by mouth every 4 (four) hours as needed for headache or mild pain.  Marland Kitchen amiodarone (PACERONE) 200 MG tablet Take 1 tablet (200 mg total) by mouth daily.  Marland Kitchen apixaban (ELIQUIS) 5 MG TABS tablet Take 1 tablet (5 mg total) by mouth 2 (two) times daily.  Marland Kitchen atorvastatin (LIPITOR) 20 MG tablet Take 1 tablet (20 mg total) by mouth daily.  Marland Kitchen escitalopram (LEXAPRO) 10 MG tablet Take 1 tablet (10 mg total) by mouth daily.  . folic acid (FOLVITE) 1 MG tablet Take 1 tablet (1 mg total) by mouth daily.  Marland Kitchen ketoconazole (NIZORAL) 2 % cream Apply 1 application topically daily. To dry scaly skin on feet  . loratadine (CLARITIN) 10 MG tablet Take 1 tablet (10 mg total) by mouth daily.  . Magnesium Oxide 400 MG CAPS Take 1 capsule (400 mg total) by mouth 2 (two) times daily.  . midodrine (PROAMATINE) 2.5 MG tablet Take 1 tablet (2.5 mg total) by mouth 3 (three) times daily with meals.  . Multiple Vitamins-Minerals (CENTRUM SILVER 50+MEN) TABS Take 1 tablet by mouth daily with breakfast.  . nitroGLYCERIN (NITROSTAT) 0.4 MG SL tablet Place 1 tablet (0.4 mg total) under the tongue every 5 (five) minutes x 3 doses as needed for chest pain.  . pantoprazole (PROTONIX) 40 MG tablet Take 1 tablet (40 mg total) by mouth daily.  . silver sulfADIAZINE (SILVADENE) 1 % cream Apply 1 application topically daily.  . tamsulosin (FLOMAX) 0.4 MG CAPS capsule Take 1 capsule (0.4 mg total) by mouth every evening.  . traZODone  (DESYREL) 50 MG tablet Take 1 tablet (50 mg total) by mouth at bedtime.  . [DISCONTINUED] furosemide (LASIX) 40 MG tablet Take 1 tablet (40 mg total) by mouth daily as needed for fluid (weight gain).  . [DISCONTINUED] potassium chloride SA (KLOR-CON) 20 MEQ tablet Take 2 tablets (40 mEq total) by mouth daily as needed. ONLY WHEN YOU TAKE LASIX     Allergies:   Penicillins and Penicillins   Social History   Socioeconomic History  . Marital status: Widowed    Spouse name: Not on file  . Number of children: 2  . Years of education: college  . Highest education level: Not on file  Occupational History  . Occupation: retired  Tobacco Use  . Smoking  status: Never Smoker  . Smokeless tobacco: Never Used  . Tobacco comment:    quit 20 years  smoked on and off for 20 years  Vaping Use  . Vaping Use: Never used  Substance and Sexual Activity  . Alcohol use: Not Currently  . Drug use: Never  . Sexual activity: Not Currently  Other Topics Concern  . Not on file  Social History Narrative   ** Merged History Encounter **       The patient is divorced w/ 1 son 1 daughter He is a retired Regulatory affairs officer, he lived in [place] but [employer] including windows on the world in the [place] room in [place] He moved to [place] to be near his sister who liv   es in [place] alcoholic member of AA 2 caffeinated beverages daily prior smoker no drugs   Social Determinants of Radio broadcast assistant Strain: Not on [employer] Insecurity: Not on file  Transportation Needs: Not on file  Physical Activity: Not on file  Stress: Not on file  Social Connections: Not on file     Family History: The patient's family history includes Arthritis in his mother; Heart disease (age of onset: 5) in his mother; Heart failure in his sister; Heart failure (age of onset: 46) in his father; Hypertension in his mother; Stroke in his maternal aunt. There is no history of  Colon cancer, Esophageal cancer, Stomach cancer, or Rectal cancer.  ROS:   Please see the history of present illness.    All other systems reviewed and are negative.  EKGs/Labs/Other Studies Reviewed:    The following studies were reviewed today: Echo 02-23-2020: IMPRESSIONS    1. Left ventricular ejection fraction, by estimation, is 60 to 65%. The  left ventricle has normal function. The left ventricle has no regional  wall motion abnormalities. Left ventricular diastolic function could not  be evaluated.  2. Apical RV hyperkinesis. Right ventricular systolic function is normal.  The right ventricular size is mildly enlarged.  3. Left atrial size was mildly dilated.  4. The mitral valve is grossly normal. Trivial mitral valve  regurgitation.  5. The aortic valve was not well visualized. Aortic valve regurgitation  is not visualized.  6. There is dilatation of the aortic root, measuring 42 mm. There is  dilatation of the ascending aorta, measuring 39 mm.  7. The inferior vena cava is dilated in size with <50% respiratory  variability, suggesting right atrial pressure of 15 mmHg.   Comparison(s): A prior study was performed on 02/06/20. Compared to prior,  McConnell's Sign redemonstrated. Stable Aortic Root and Ascending Aorta  measurements. IVC dilation increased from prior. Less MR demonstrated in  this study.   Event Monitor 04/27/2020: Study Highlights  1. The basic rhythm is normal sinus with an average HR of 74 bpm 2. No atrial fibrillation or flutter 3. No high-grade heart block or pathologic pauses 4. There are occasional PVC's and rare supraventricular beats without sustained arrhythmias   EKG:  EKG is not ordered today.    Recent Labs: 02/22/2020: B Natriuretic Peptide 266.2 06/03/2020: ALT 29; Platelets 163; TSH 4.450 06/16/2020: Magnesium 1.7 07/08/2020: BUN 16; Creatinine, Ser 1.40; Hemoglobin 10.2; Potassium 4.3; Sodium 125  Recent Lipid Panel     Component Value Date/Time   CHOL 81 02/28/2019 0314   TRIG 27 02/28/2019 0314   HDL 47 02/28/2019 0314   CHOLHDL 1.7 02/28/2019 0314   VLDL 5 02/28/2019 0314  [place] 29 02/28/2019 0314     Risk Assessment/Calculations:    CHA2DS2-VASc Score = 4  This indicates a 4.8% annual risk of stroke. The patient's score is based upon: CHF History: Yes HTN History: Yes Diabetes History: No Stroke History: No Vascular Disease History: No Age Score: 2 Gender Score: 0      Physical Exam:    VS:  BP 138/70   Pulse 77   Ht 6' (1.829 m)   Wt 234 lb (106.1 kg)   SpO2 98%   BMI 31.74 kg/m     Wt Readings from Last 3 Encounters:  08/03/20 234 lb (106.1 kg)  07/28/20 231 lb 12.8 oz (105.1 kg)  07/08/20 242 lb 15.2 oz (110.2 kg)     GEN:  Pleasant elderly male in no acute distress HEENT: Normal NECK: JVP is moderately elevated; No carotid bruits LYMPHATICS: No lymphadenopathy CARDIAC: RRR, 2/6 SEM at the RUSB RESPIRATORY:  Clear to auscultation without rales, wheezing or rhonchi  ABDOMEN: Soft, non-tender, non-distended MUSCULOSKELETAL: 2+ bilateral pretibial edema; No deformity  SKIN: Warm and dry with diffuse maculopapular rash NEUROLOGIC:  Alert and oriented x 3 PSYCHIATRIC:  Normal affect   ASSESSMENT:    1. Paroxysmal atrial fibrillation (HCC)   2. Orthostatic hypotension   3. Tachycardia-bradycardia syndrome ([place])   4. Acute on chronic combined systolic and diastolic CHF (congestive heart failure) ([place])   5. Physical deconditioning    PLAN:    In order of problems listed above:  1. Appears to be maintaining sinus rhythm on amiodarone.  Most recent labs are reviewed with normal LFTs and thyroid studies.  Anticoagulated with apixaban.  Continue current management. 2. Seems improved.  Continue to monitor.  Patient taking low-dose midodrine. 3. Heart rhythm stable, no dizziness or other problems at present. 4. Patient appears volume overloaded.  Advised him to  start taking furosemide as a scheduled drug every day.  We discussed daily weights.  He has a scale at home but has not been using it.  I am not sure how well he would do with the diuretic sliding scale.  We advised the patient and his son to call if he has 5 pounds or greater weight gain. 5. I think the patient is quite deconditioned.  He has a very sedentary lifestyle.  Discussed this at length with him and his son.  They would like to pursue home PT/OT.  Will place order.  I think he would benefit significantly.  He exhibits signs of gait unsteadiness and has had recurrent falls.  Medication Adjustments/Labs and Tests Ordered: Current medicines are reviewed at length with the patient today.  Concerns regarding medicines are outlined above.  Orders Placed This Encounter  Procedures  . Ambulatory referral to [facility] ordered this encounter  Medications  . potassium chloride SA (KLOR-CON) 20 MEQ tablet    Sig: Take 2 tablets (40 mEq total) by mouth daily.    Dispense:  180 tablet    Refill:  3  . furosemide (LASIX) 40 MG tablet    Sig: Take 1 tablet (40 mg total) by mouth daily.    Dispense:  90 tablet    Refill:  3    Patient Instructions  Medication Instructions:  1) INCREASE LASIX to 40 mg daily 2) INCREASE POTASSIUM to 40 meq daily *If you need a refill on your cardiac medications before your next appointment, please call your pharmacy*  Lab Work: Your provider recommends that you return for lab  work in [employer]. If you have labs (blood work) drawn today and your tests are completely normal, you will receive your results only by: Marland Kitchen MyChart Message (if you have MyChart) OR . A paper copy in the mail If you have any lab test that is abnormal or we need to change your treatment, we will call you to review the results.   Follow-Up: At [facility], you and your health needs are our priority.  As part of our continuing mission to provide you with exceptional heart care,  we have created designated Provider Care Teams.  These Care Teams include your primary Cardiologist (physician) and Advanced Practice Providers (APPs -  Physician Assistants and Nurse Practitioners) who all work together to provide you with the care you need, when you need it. Your next appointment:   6 month(s) The format for your next appointment:   In Person Provider:   You may see Sherren Mocha, MD or one of the following Advanced Practice Providers on your designated Care Team:    Richardson Dopp, PA-C  Robbie Lis, [place]      Signed, Sherren Mocha, MD  08/03/2020 5:12 PM    [place]

## 2020-08-03 NOTE — Patient Instructions (Signed)
Medication Instructions:  1) INCREASE LASIX to 40 mg daily 2) INCREASE POTASSIUM to 40 meq daily *If you need a refill on your cardiac medications before your next appointment, please call your pharmacy*  Lab Work: Your provider recommends that you return for lab work in [place]. If you have labs (blood work) drawn today and your tests are completely normal, you will receive your results only by: Marland Kitchen MyChart Message (if you have MyChart) OR . A paper copy in the mail If you have any lab test that is abnormal or we need to change your treatment, we will call you to review the results.   Follow-Up: At [facility], you and your health needs are our priority.  As part of our continuing mission to provide you with exceptional heart care, we have created designated Provider Care Teams.  These Care Teams include your primary Cardiologist (physician) and Advanced Practice Providers (APPs -  Physician Assistants and Nurse Practitioners) who all work together to provide you with the care you need, when you need it. Your next appointment:   6 month(s) The format for your next appointment:   In Person Provider:   You may see Sherren Mocha, MD or one of the following Advanced Practice Providers on your designated Care Team:    Richardson Dopp, PA-C  Vin Buford, [place]

## 2020-08-12 ENCOUNTER — Other Ambulatory Visit: Payer: Self-pay | Admitting: Cardiovascular Disease

## 2020-08-12 DIAGNOSIS — I4891 Unspecified atrial fibrillation: Secondary | ICD-10-CM

## 2020-08-12 NOTE — Telephone Encounter (Signed)
Prescription refill request for Eliquis received. Indication: PAF Last office visit:  08/03/20 Scr: 1.40 on 07/08/20 Age: 80 Weight: 106.1kg  Based on the above findings Eliquis 5mg  twice daily is the appropriate dose.  Refill approved.

## 2020-08-17 ENCOUNTER — Other Ambulatory Visit: Payer: Self-pay

## 2020-08-17 DIAGNOSIS — I5043 Acute on chronic combined systolic (congestive) and diastolic (congestive) heart failure: Secondary | ICD-10-CM

## 2020-08-20 DIAGNOSIS — R21 Rash and other nonspecific skin eruption: Secondary | ICD-10-CM | POA: Diagnosis not present

## 2020-08-20 DIAGNOSIS — I872 Venous insufficiency (chronic) (peripheral): Secondary | ICD-10-CM | POA: Diagnosis not present

## 2020-08-20 DIAGNOSIS — I8312 Varicose veins of left lower extremity with inflammation: Secondary | ICD-10-CM | POA: Diagnosis not present

## 2020-08-20 DIAGNOSIS — I8311 Varicose veins of right lower extremity with inflammation: Secondary | ICD-10-CM | POA: Diagnosis not present

## 2020-08-20 DIAGNOSIS — L308 Other specified dermatitis: Secondary | ICD-10-CM | POA: Diagnosis not present

## 2020-09-03 ENCOUNTER — Other Ambulatory Visit: Payer: Medicare Other

## 2020-09-03 ENCOUNTER — Other Ambulatory Visit: Payer: Self-pay

## 2020-09-03 DIAGNOSIS — I5043 Acute on chronic combined systolic (congestive) and diastolic (congestive) heart failure: Secondary | ICD-10-CM | POA: Diagnosis not present

## 2020-09-04 LAB — BASIC METABOLIC PANEL
BUN/Creatinine Ratio: 15 (ref 10–24)
BUN: 27 mg/dL (ref 8–27)
CO2: 19 mmol/L — ABNORMAL LOW (ref 20–29)
Calcium: 8.1 mg/dL — ABNORMAL LOW (ref 8.6–10.2)
Chloride: 98 mmol/L (ref 96–106)
Creatinine, Ser: 1.78 mg/dL — ABNORMAL HIGH (ref 0.76–1.27)
Glucose: 85 mg/dL (ref 65–99)
Potassium: 4.6 mmol/L (ref 3.5–5.2)
Sodium: 133 mmol/L — ABNORMAL LOW (ref 134–144)
eGFR: 38 mL/min/{1.73_m2} — ABNORMAL LOW (ref >59)

## 2020-09-07 ENCOUNTER — Emergency Department (HOSPITAL_COMMUNITY): Payer: Medicare Other

## 2020-09-07 ENCOUNTER — Observation Stay (HOSPITAL_COMMUNITY): Payer: Medicare Other

## 2020-09-07 ENCOUNTER — Other Ambulatory Visit: Payer: Self-pay

## 2020-09-07 ENCOUNTER — Inpatient Hospital Stay (HOSPITAL_COMMUNITY)
Admission: EM | Admit: 2020-09-07 | Discharge: 2020-09-13 | DRG: 069 | Disposition: A | Discharge status: Home Health | Payer: Medicare Other | Attending: Internal Medicine | Admitting: Internal Medicine

## 2020-09-07 ENCOUNTER — Encounter (HOSPITAL_COMMUNITY): Payer: PRIVATE HEALTH INSURANCE

## 2020-09-07 ENCOUNTER — Encounter (HOSPITAL_COMMUNITY): Payer: Self-pay | Admitting: Emergency Medicine

## 2020-09-07 DIAGNOSIS — D62 Acute posthemorrhagic anemia: Secondary | ICD-10-CM | POA: Diagnosis present

## 2020-09-07 DIAGNOSIS — I8311 Varicose veins of right lower extremity with inflammation: Secondary | ICD-10-CM | POA: Diagnosis not present

## 2020-09-07 DIAGNOSIS — D631 Anemia in chronic kidney disease: Secondary | ICD-10-CM | POA: Diagnosis present

## 2020-09-07 DIAGNOSIS — F419 Anxiety disorder, unspecified: Secondary | ICD-10-CM | POA: Diagnosis present

## 2020-09-07 DIAGNOSIS — G459 Transient cerebral ischemic attack, unspecified: Principal | ICD-10-CM | POA: Diagnosis present

## 2020-09-07 DIAGNOSIS — F329 Major depressive disorder, single episode, unspecified: Secondary | ICD-10-CM | POA: Diagnosis present

## 2020-09-07 DIAGNOSIS — E785 Hyperlipidemia, unspecified: Secondary | ICD-10-CM | POA: Diagnosis present

## 2020-09-07 DIAGNOSIS — I130 Hypertensive heart and chronic kidney disease with heart failure and stage 1 through stage 4 chronic kidney disease, or unspecified chronic kidney disease: Secondary | ICD-10-CM | POA: Diagnosis present

## 2020-09-07 DIAGNOSIS — Z79899 Other long term (current) drug therapy: Secondary | ICD-10-CM

## 2020-09-07 DIAGNOSIS — N281 Cyst of kidney, acquired: Secondary | ICD-10-CM | POA: Diagnosis not present

## 2020-09-07 DIAGNOSIS — R0602 Shortness of breath: Secondary | ICD-10-CM

## 2020-09-07 DIAGNOSIS — F32A Depression, unspecified: Secondary | ICD-10-CM | POA: Diagnosis present

## 2020-09-07 DIAGNOSIS — Z8719 Personal history of other diseases of the digestive system: Secondary | ICD-10-CM

## 2020-09-07 DIAGNOSIS — E871 Hypo-osmolality and hyponatremia: Secondary | ICD-10-CM | POA: Diagnosis present

## 2020-09-07 DIAGNOSIS — I5033 Acute on chronic diastolic (congestive) heart failure: Secondary | ICD-10-CM | POA: Diagnosis present

## 2020-09-07 DIAGNOSIS — Z7901 Long term (current) use of anticoagulants: Secondary | ICD-10-CM

## 2020-09-07 DIAGNOSIS — K7030 Alcoholic cirrhosis of liver without ascites: Secondary | ICD-10-CM | POA: Diagnosis present

## 2020-09-07 DIAGNOSIS — I872 Venous insufficiency (chronic) (peripheral): Secondary | ICD-10-CM | POA: Diagnosis not present

## 2020-09-07 DIAGNOSIS — Z8249 Family history of ischemic heart disease and other diseases of the circulatory system: Secondary | ICD-10-CM

## 2020-09-07 DIAGNOSIS — Z20822 Contact with and (suspected) exposure to covid-19: Secondary | ICD-10-CM | POA: Diagnosis present

## 2020-09-07 DIAGNOSIS — I8312 Varicose veins of left lower extremity with inflammation: Secondary | ICD-10-CM | POA: Diagnosis not present

## 2020-09-07 DIAGNOSIS — Z823 Family history of stroke: Secondary | ICD-10-CM

## 2020-09-07 DIAGNOSIS — G4700 Insomnia, unspecified: Secondary | ICD-10-CM | POA: Diagnosis present

## 2020-09-07 DIAGNOSIS — Z8674 Personal history of sudden cardiac arrest: Secondary | ICD-10-CM

## 2020-09-07 DIAGNOSIS — Z880 Allergy status to penicillin: Secondary | ICD-10-CM

## 2020-09-07 DIAGNOSIS — I495 Sick sinus syndrome: Secondary | ICD-10-CM | POA: Diagnosis present

## 2020-09-07 DIAGNOSIS — R29898 Other symptoms and signs involving the musculoskeletal system: Secondary | ICD-10-CM | POA: Diagnosis present

## 2020-09-07 DIAGNOSIS — L308 Other specified dermatitis: Secondary | ICD-10-CM | POA: Diagnosis not present

## 2020-09-07 DIAGNOSIS — N059 Unspecified nephritic syndrome with unspecified morphologic changes: Secondary | ICD-10-CM

## 2020-09-07 DIAGNOSIS — Z8261 Family history of arthritis: Secondary | ICD-10-CM

## 2020-09-07 DIAGNOSIS — R531 Weakness: Secondary | ICD-10-CM | POA: Diagnosis not present

## 2020-09-07 DIAGNOSIS — N179 Acute kidney failure, unspecified: Secondary | ICD-10-CM | POA: Diagnosis present

## 2020-09-07 DIAGNOSIS — M19042 Primary osteoarthritis, left hand: Secondary | ICD-10-CM | POA: Diagnosis present

## 2020-09-07 DIAGNOSIS — G319 Degenerative disease of nervous system, unspecified: Secondary | ICD-10-CM | POA: Diagnosis not present

## 2020-09-07 DIAGNOSIS — R3129 Other microscopic hematuria: Secondary | ICD-10-CM | POA: Diagnosis present

## 2020-09-07 DIAGNOSIS — N400 Enlarged prostate without lower urinary tract symptoms: Secondary | ICD-10-CM | POA: Diagnosis present

## 2020-09-07 DIAGNOSIS — F1021 Alcohol dependence, in remission: Secondary | ICD-10-CM | POA: Diagnosis present

## 2020-09-07 DIAGNOSIS — G4733 Obstructive sleep apnea (adult) (pediatric): Secondary | ICD-10-CM | POA: Diagnosis present

## 2020-09-07 DIAGNOSIS — K760 Fatty (change of) liver, not elsewhere classified: Secondary | ICD-10-CM | POA: Diagnosis present

## 2020-09-07 DIAGNOSIS — E875 Hyperkalemia: Secondary | ICD-10-CM | POA: Diagnosis present

## 2020-09-07 DIAGNOSIS — I951 Orthostatic hypotension: Secondary | ICD-10-CM | POA: Diagnosis present

## 2020-09-07 DIAGNOSIS — M19041 Primary osteoarthritis, right hand: Secondary | ICD-10-CM | POA: Diagnosis present

## 2020-09-07 DIAGNOSIS — I480 Paroxysmal atrial fibrillation: Secondary | ICD-10-CM | POA: Diagnosis present

## 2020-09-07 DIAGNOSIS — I5032 Chronic diastolic (congestive) heart failure: Secondary | ICD-10-CM | POA: Diagnosis present

## 2020-09-07 DIAGNOSIS — K219 Gastro-esophageal reflux disease without esophagitis: Secondary | ICD-10-CM | POA: Diagnosis present

## 2020-09-07 DIAGNOSIS — N1831 Chronic kidney disease, stage 3a: Secondary | ICD-10-CM | POA: Diagnosis present

## 2020-09-07 LAB — COMPREHENSIVE METABOLIC PANEL
ALT: 11 U/L (ref 0–44)
AST: 19 U/L (ref 15–41)
Albumin: 2.4 g/dL — ABNORMAL LOW (ref 3.5–5.0)
Alkaline Phosphatase: 106 U/L (ref 38–126)
Anion gap: 10 (ref 5–15)
BUN: 28 mg/dL — ABNORMAL HIGH (ref 8–23)
CO2: 19 mmol/L — ABNORMAL LOW (ref 22–32)
Calcium: 8.1 mg/dL — ABNORMAL LOW (ref 8.9–10.3)
Chloride: 98 mmol/L (ref 98–111)
Creatinine, Ser: 2.82 mg/dL — ABNORMAL HIGH (ref 0.61–1.24)
GFR, Estimated: 22 mL/min — ABNORMAL LOW (ref >60)
Glucose, Bld: 91 mg/dL (ref 70–99)
Potassium: 4.6 mmol/L (ref 3.5–5.1)
Sodium: 127 mmol/L — ABNORMAL LOW (ref 135–145)
Total Bilirubin: 1.2 mg/dL (ref 0.3–1.2)
Total Protein: 5.2 g/dL — ABNORMAL LOW (ref 6.5–8.1)

## 2020-09-07 LAB — URINALYSIS, ROUTINE W REFLEX MICROSCOPIC
Bilirubin Urine: NEGATIVE
Glucose, UA: NEGATIVE mg/dL
Ketones, ur: NEGATIVE mg/dL
Leukocytes,Ua: NEGATIVE
Nitrite: NEGATIVE
Protein, ur: 100 mg/dL — AB
RBC / HPF: >50 RBC/hpf — ABNORMAL HIGH (ref 0–5)
Specific Gravity, Urine: 1.01 (ref 1.005–1.030)
pH: 5 (ref 5.0–8.0)

## 2020-09-07 LAB — PROTIME-INR
INR: 1.4 — ABNORMAL HIGH (ref 0.8–1.2)
Prothrombin Time: 16.8 seconds — ABNORMAL HIGH (ref 11.4–15.2)

## 2020-09-07 LAB — LIPID PANEL
Cholesterol: 109 mg/dL (ref 0–200)
HDL: 55 mg/dL (ref >40)
LDL Cholesterol: 46 mg/dL (ref 0–99)
Total CHOL/HDL Ratio: 2 RATIO
Triglycerides: 40 mg/dL (ref <150)
VLDL: 8 mg/dL (ref 0–40)

## 2020-09-07 LAB — RESP PANEL BY RT-PCR (FLU A&B, COVID) ARPGX2
Influenza A by PCR: NEGATIVE
Influenza B by PCR: NEGATIVE
SARS Coronavirus 2 by RT PCR: NEGATIVE

## 2020-09-07 LAB — CBC WITH DIFFERENTIAL/PLATELET
Abs Immature Granulocytes: 0.04 10*3/uL (ref 0.00–0.07)
Basophils Absolute: 0 10*3/uL (ref 0.0–0.1)
Basophils Relative: 0 %
Eosinophils Absolute: 0 10*3/uL (ref 0.0–0.5)
Eosinophils Relative: 0 %
HCT: 23.7 % — ABNORMAL LOW (ref 39.0–52.0)
Hemoglobin: 8 g/dL — ABNORMAL LOW (ref 13.0–17.0)
Immature Granulocytes: 1 %
Lymphocytes Relative: 11 %
Lymphs Abs: 0.8 10*3/uL (ref 0.7–4.0)
MCH: 33.2 pg (ref 26.0–34.0)
MCHC: 33.8 g/dL (ref 30.0–36.0)
MCV: 98.3 fL (ref 80.0–100.0)
Monocytes Absolute: 0.6 10*3/uL (ref 0.1–1.0)
Monocytes Relative: 8 %
Neutro Abs: 5.9 10*3/uL (ref 1.7–7.7)
Neutrophils Relative %: 80 %
Platelets: 230 10*3/uL (ref 150–400)
RBC: 2.41 MIL/uL — ABNORMAL LOW (ref 4.22–5.81)
RDW: 15 % (ref 11.5–15.5)
WBC: 7.4 10*3/uL (ref 4.0–10.5)
nRBC: 0.3 % — ABNORMAL HIGH (ref 0.0–0.2)

## 2020-09-07 LAB — TROPONIN I (HIGH SENSITIVITY): Troponin I (High Sensitivity): 8 ng/L (ref <18)

## 2020-09-07 LAB — ETHANOL: Alcohol, Ethyl (B): <10 mg/dL (ref <10)

## 2020-09-07 LAB — RAPID URINE DRUG SCREEN, HOSP PERFORMED
Amphetamines: NOT DETECTED
Barbiturates: NOT DETECTED
Benzodiazepines: NOT DETECTED
Cocaine: NOT DETECTED
Opiates: NOT DETECTED
Tetrahydrocannabinol: NOT DETECTED

## 2020-09-07 MED ORDER — MIDODRINE HCL 5 MG PO TABS
2.5000 mg | ORAL_TABLET | Freq: Three times a day (TID) | ORAL | Status: DC
  Administered 2020-09-08 (×2): 2.5 mg via ORAL
  Filled 2020-09-07 (×4): qty 1

## 2020-09-07 MED ORDER — ACETAMINOPHEN 160 MG/5ML PO SOLN: 650.0000 mg | ORAL | Status: DC | PRN

## 2020-09-07 MED ORDER — ATORVASTATIN CALCIUM 10 MG PO TABS
20.0000 mg | ORAL_TABLET | Freq: Every day | ORAL | Status: DC
  Administered 2020-09-08 – 2020-09-13 (×6): 20 mg via ORAL
  Filled 2020-09-07 (×6): qty 2

## 2020-09-07 MED ORDER — FOLIC ACID 1 MG PO TABS
1.0000 mg | ORAL_TABLET | Freq: Every day | ORAL | Status: DC
  Administered 2020-09-08 – 2020-09-13 (×6): 1 mg via ORAL
  Filled 2020-09-07 (×6): qty 1

## 2020-09-07 MED ORDER — SENNOSIDES-DOCUSATE SODIUM 8.6-50 MG PO TABS: 1.0000 | ORAL_TABLET | Freq: Every evening | ORAL | Status: DC | PRN

## 2020-09-07 MED ORDER — TAMSULOSIN HCL 0.4 MG PO CAPS
0.4000 mg | ORAL_CAPSULE | Freq: Every evening | ORAL | Status: DC
  Administered 2020-09-07 – 2020-09-12 (×6): 0.4 mg via ORAL
  Filled 2020-09-07 (×6): qty 1

## 2020-09-07 MED ORDER — SODIUM CHLORIDE 0.9 % IV SOLN: INTRAVENOUS | Status: AC

## 2020-09-07 MED ORDER — PANTOPRAZOLE SODIUM 40 MG PO TBEC
40.0000 mg | DELAYED_RELEASE_TABLET | Freq: Every day | ORAL | Status: DC
  Administered 2020-09-08 – 2020-09-13 (×6): 40 mg via ORAL
  Filled 2020-09-07 (×6): qty 1

## 2020-09-07 MED ORDER — STROKE: EARLY STAGES OF RECOVERY BOOK
Freq: Once | Status: AC
  Filled 2020-09-07 (×2): qty 1

## 2020-09-07 MED ORDER — APIXABAN 5 MG PO TABS
5.0000 mg | ORAL_TABLET | Freq: Two times a day (BID) | ORAL | Status: DC
  Administered 2020-09-07 – 2020-09-08 (×2): 5 mg via ORAL
  Filled 2020-09-07 (×2): qty 1

## 2020-09-07 MED ORDER — FERROUS SULFATE 325 (65 FE) MG PO TABS
325.0000 mg | ORAL_TABLET | Freq: Two times a day (BID) | ORAL | Status: DC
  Administered 2020-09-08 – 2020-09-13 (×11): 325 mg via ORAL
  Filled 2020-09-07 (×11): qty 1

## 2020-09-07 MED ORDER — ESCITALOPRAM OXALATE 10 MG PO TABS
10.0000 mg | ORAL_TABLET | Freq: Every day | ORAL | Status: DC
  Administered 2020-09-08 – 2020-09-13 (×6): 10 mg via ORAL
  Filled 2020-09-07 (×6): qty 1

## 2020-09-07 MED ORDER — AMIODARONE HCL 200 MG PO TABS
200.0000 mg | ORAL_TABLET | Freq: Every day | ORAL | Status: DC
  Administered 2020-09-08 – 2020-09-13 (×6): 200 mg via ORAL
  Filled 2020-09-07 (×6): qty 1

## 2020-09-07 MED ORDER — ACETAMINOPHEN 650 MG RE SUPP: 650.0000 mg | RECTAL | Status: DC | PRN

## 2020-09-07 MED ORDER — ACETAMINOPHEN 325 MG PO TABS
650.0000 mg | ORAL_TABLET | ORAL | Status: DC | PRN
  Administered 2020-09-08: 650 mg via ORAL
  Filled 2020-09-07: qty 2
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
## 2020-09-07 NOTE — ED Notes (Signed)
Pt repositioned in bed and lights turned off for him to try and sleep

## 2020-09-07 NOTE — ED Notes (Signed)
US at bedside at this time 
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
## 2020-09-07 NOTE — Consult Note (Signed)
Neurology Consultation  Reason for Consult: Transient left lower extremity weakness Referring Physician: Dr. Dina Rich  CC: Transient left lower extremity weakness  History is obtained from: Patient, Chart review  HPI: Darryl Diaz is a 80 y.o. male with a medical history significant for atrial fibrillation on Eliquis, HTN, HLD, OSA, GIB, and cardiac arrest who presented to the ED from his dermatology appointment today. Darryl Diaz states that once his appointment was complete, he went to get into his car and realized that his left lower extremity was weak and was unable to lift the leg into the vehicle. He states that he had to assist himself with lifting his leg by using his upper arms and noted that most of the weakness was from the left hip area. He was able to drive to the hospital and was activated as a Code Stroke through triage. In the CT scanner, patient endorses that his left lower extremity weakness had since resolved.  LKW: 14:00 tpa given?: no, on anticoagulation- Eliquis IR Thrombectomy? No, presentation not consistent with LVO Modified Rankin Scale: 3-Moderate disability-requires help but walks WITHOUT assistance  ROS: A complete ROS was performed and is negative except as noted in the HPI.   Past Medical History:  Diagnosis Date  . Alcoholism ([place])    in remission following wife's death  . Anemia   . 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 Bradycardia    low 20's  . Bradycardic cardiac arrest ([place])   . Cardiac arrest ([place]) 04/20/2019   12 min CPR with epi  . Chronic diastolic CHF (congestive heart failure) ([place])   . Fall at home 03/28/2016  . Fatty liver   . Gastritis   . GERD (gastroesophageal reflux disease)   . H/O seasonal allergies   . History of GI bleed   . Hx of adenomatous colonic polyps 08/10/2017  . Hyperlipidemia   . Hypertension   . Hypotension   . Insomnia   . 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 Mallory-Weiss tear   . OA  (osteoarthritis)    hands  . OSA (obstructive sleep apnea)    No longer uses CPAP  . Persistent atrial fibrillation with rapid ventricular response ([place]) 02/05/2020  . Recurrent syncope   . 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 ANKLE SURGERY Left 2014   had rods put in   . BIOPSY  02/28/2019   Procedure: BIOPSY;  Surgeon: Milus Banister, MD;  Location: [facility] ENDOSCOPY;  Service: Endoscopy;;  . CARDIOVERSION  02/06/2020  . CARDIOVERSION N/A 02/06/2020   Procedure: CARDIOVERSION;  Surgeon: Werner Lean, MD;  Location: [facility] ENDOSCOPY;  Service: Cardiovascular;  Laterality: N/A;  . CARDIOVERSION N/A 02/24/2020   Procedure: CARDIOVERSION;  Surgeon: Werner Lean, MD;  Location: [place] ENDOSCOPY;  Service: Cardiovascular;  Laterality: N/A;  . [place]  . ESOPHAGOGASTRODUODENOSCOPY (EGD) WITH PROPOFOL N/A 02/28/2019   Procedure: ESOPHAGOGASTRODUODENOSCOPY (EGD) WITH PROPOFOL;  Surgeon: Milus Banister, MD;  Location: [facility] ENDOSCOPY;  Service: Endoscopy;  Laterality: N/A;  . HIP ARTHROPLASTY Left 04/01/2016   Procedure: ARTHROPLASTY BIPOLAR HIP (HEMIARTHROPLASTY);  Surgeon: Paralee Cancel, MD;  Location: [place];  Service: Orthopedics;  Laterality: Left;  . HIP CLOSED REDUCTION Left 04/30/2016   Procedure: CLOSED REDUCTION HIP;  Surgeon: Wylene Simmer, MD;  Location: [place];  Service: Orthopedics;  Laterality: Left;  . TONSILLECTOMY    . TOTAL HIP ARTHROPLASTY Right  10/15/2019   Procedure: TOTAL HIP ARTHROPLASTY ANTERIOR APPROACH;  Surgeon: Paralee Cancel, MD;  [facility]: [facility];  Service: Orthopedics;  Laterality: Right;  70 mins   Family History  Problem Relation Age of Onset  . Heart disease Mother 78  . Hypertension Mother   . Arthritis Mother   . Heart failure Father 42  . Stroke Maternal Aunt   . Heart failure Sister   . Colon cancer Neg Hx   . Esophageal cancer Neg Hx   . Stomach cancer Neg Hx   . Rectal cancer  Neg Hx    Social History:   reports that he has never smoked. He has never used smokeless tobacco. He reports previous alcohol use. He reports that he does not use drugs.  Medications No current facility-administered medications for this encounter.  Current Outpatient Medications:  .  acetaminophen (TYLENOL) 325 MG tablet, Take 2 tablets (650 mg total) by mouth every 4 (four) hours as needed for headache or mild pain., Disp: , Rfl:  .  amiodarone (PACERONE) 200 MG tablet, Take 1 tablet (200 mg total) by mouth daily., Disp: 90 tablet, Rfl: 3 .  atorvastatin (LIPITOR) 20 MG tablet, Take 1 tablet (20 mg total) by mouth daily., Disp: 30 tablet, Rfl: 0 .  ELIQUIS 5 MG TABS tablet, TAKE 1 TABLET BY MOUTH TWICE DAILY. NEW CUSTOMER FOR [facility]. REQUESTING NEW PRESCRIPTION., Disp: 180 tablet, Rfl: 1 .  escitalopram (LEXAPRO) 10 MG tablet, Take 1 tablet (10 mg total) by mouth daily., Disp: 30 tablet, Rfl: 0 .  ferrous sulfate 325 (65 FE) MG tablet, Take 1 tablet (325 mg total) by mouth 2 (two) times daily with a meal., Disp: 60 tablet, Rfl: 0 .  folic acid (FOLVITE) 1 MG tablet, Take 1 tablet (1 mg total) by mouth daily., Disp: 30 tablet, Rfl: 0 .  furosemide (LASIX) 40 MG tablet, Take 1 tablet (40 mg total) by mouth daily., Disp: 90 tablet, Rfl: 3 .  ketoconazole (NIZORAL) 2 % cream, Apply 1 application topically daily. To dry scaly skin on feet, Disp: 60 g, Rfl: 2 .  loratadine (CLARITIN) 10 MG tablet, Take 1 tablet (10 mg total) by mouth daily., Disp: 30 tablet, Rfl: 0 .  Magnesium Oxide 400 MG CAPS, Take 1 capsule (400 mg total) by mouth 2 (two) times daily., Disp: 60 capsule, Rfl: 11 .  midodrine (PROAMATINE) 2.5 MG tablet, Take 1 tablet (2.5 mg total) by mouth 3 (three) times daily with meals., Disp: 90 tablet, Rfl: 3 .  Multiple Vitamins-Minerals (CENTRUM SILVER 50+MEN) TABS, Take 1 tablet by mouth daily with breakfast., Disp: , Rfl:  .  nitroGLYCERIN (NITROSTAT) 0.4 MG SL tablet, Place 1  tablet (0.4 mg total) under the tongue every 5 (five) minutes x 3 doses as needed for chest pain., Disp: 30 tablet, Rfl: 12 .  pantoprazole (PROTONIX) 40 MG tablet, Take 1 tablet (40 mg total) by mouth daily., Disp: 30 tablet, Rfl: 0 .  potassium chloride SA (KLOR-CON) 20 MEQ tablet, Take 2 tablets (40 mEq total) by mouth daily., Disp: 180 tablet, Rfl: 3 .  silver sulfADIAZINE (SILVADENE) 1 % cream, Apply 1 application topically daily., Disp: 50 g, Rfl: 0 .  tamsulosin (FLOMAX) 0.4 MG CAPS capsule, Take 1 capsule (0.4 mg total) by mouth every evening., Disp: 30 capsule, Rfl: 0 .  traZODone (DESYREL) 50 MG tablet, Take 1 tablet (50 mg total) by mouth at bedtime., Disp: 30 tablet, Rfl: 0  Facility-Administered Medications Ordered in Other Encounters:  .  metoprolol tartrate (LOPRESSOR) injection, , , Anesthesia Intra-op, Mariea Clonts, CRNA, 5 mg at 02/27/19 1318  Exam: Current vital signs: BP (!) 150/76   Pulse (!) 45   Temp 98.7 F (37.1 C) (Oral)   Resp 17   SpO2 100%  Vital signs in last 24 hours: Temp:  [98.7 F (37.1 C)] 98.7 F (37.1 C) (05/02 1657) Pulse Rate:  [45-89] 45 (05/02 1745) Resp:  [17] 17 (05/02 1745) BP: (135-150)/(63-76) 150/76 (05/02 1745) SpO2:  [100 %] 100 % (05/02 1745)  GENERAL: Awake, alert, in no acute distress Psych: Affect appropriate for situation Head: Normocephalic and atraumatic, dry mm EENT: Wears eyeglasses, normal conjunctivae LUNGS: Normal respiratory effort. Non-labored breathing CVA: Bradycardia on cardiac monitor ABDOMEN: Soft, non-tender Ext: 2+ hon-pitting edema with erythema on bilateral lower extremities  NEURO:  Mental Status: Awake, alert, and oriented to person, place, time, month, year, and situation. Patient is able to provide a clear and coherent history of present illness. Speech is intact without dysarthria or aphasia. Naming, repetition, fluency, and comprehension intact. Cranial Nerves:  II: PERRL 3 mm /brisk. 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 Lid elevation symmetric and full without ptosis V: Sensation is intact to light touch and symmetrical to face.   VII: Face is symmetric resting and smiling.  VIII: Hearing is intact to voice IX, X: Palate elevation is symmetric. Phonation normal.  XI: Normal sternocleidomastoid and trapezius muscle strength XII: Tongue protrudes midline without fasciculations.   Motor: 5/5 strength in right upper and lower extremities and left upper extremity. Left lower extremity with 4+/5 strength. Tone and bulk are normal.   Sensation: Intact to light touch bilaterally in all four extremities. No extinction to DSS present. Sensation to cool temperature felt cooler on the left face and left lower extremity compared to the right.  Coordination: FTN intact bilaterally. UTA HLS bilaterally. No pronator drift. Gait- deferred  NIHSS: 1a Level of Conscious.: 0 1b LOC Questions: 0 1c LOC Commands: 0 2 Best Gaze: 0 3 Visual: 0 4 Facial Palsy: 0 5a Motor Arm - left: 0 5b Motor Arm - Right: 0 6a Motor Leg - Left: 0 6b Motor Leg - Right: 0 7 Limb Ataxia: 0 8 Sensory: 0 9 Best Language: 0 10 Dysarthria: 0 11 Extinct. and Inatten.: 0 TOTAL: 0  Labs I have reviewed labs in epic and the results pertinent to this consultation are: CBC    Component Value Date/Time   WBC 7.4 09/07/2020 1656   RBC 2.41 (L) 09/07/2020 1656   HGB 8.0 (L) 09/07/2020 1656   HGB 10.2 (L) 06/03/2020 1217   HCT 23.7 (L) 09/07/2020 1656   HCT 31.0 (L) 06/03/2020 1217   PLT 230 09/07/2020 1656   PLT 163 06/03/2020 1217   MCV 98.3 09/07/2020 1656   MCV 93 06/03/2020 1217   MCH 33.2 09/07/2020 1656   MCHC 33.8 09/07/2020 1656   RDW 15.0 09/07/2020 1656   RDW 14.6 06/03/2020 1217   LYMPHSABS 0.8 09/07/2020 1656   MONOABS 0.6 09/07/2020 1656   EOSABS 0.0 09/07/2020 1656   BASOSABS 0.0 09/07/2020 1656   CMP     Component Value  Date/Time   NA 127 (L) 09/07/2020 1656   NA 133 (L) 09/03/2020 1612   K 4.6 09/07/2020 1656   CL 98 09/07/2020 1656   CO2 19 (L) 09/07/2020 1656   GLUCOSE 91 09/07/2020 1656   BUN 28 (H) 09/07/2020 1656  BUN 27 09/03/2020 1612   CREATININE 2.82 (H) 09/07/2020 1656   CALCIUM 8.1 (L) 09/07/2020 1656   PROT 5.2 (L) 09/07/2020 1656   PROT 5.7 (L) 06/03/2020 1217   ALBUMIN 2.4 (L) 09/07/2020 1656   ALBUMIN 3.4 (L) 06/03/2020 1217   AST 19 09/07/2020 1656   ALT 11 09/07/2020 1656   ALKPHOS 106 09/07/2020 1656   BILITOT 1.2 09/07/2020 1656   BILITOT 0.5 06/03/2020 1217   GFRNONAA 22 (L) 09/07/2020 1656   GFRAA 92 06/16/2020 0858   Imaging I have reviewed the images obtained:  CT-scan of the brain: 1. No acute finding. Atrophy and chronic small-vessel ischemic change. 2. ASPECTS is 10.  Assessment: 80 year old male with a medical history as above who presented as a Code Stroke via private vehicle for evaluation of transient left lower extremity weakness. At baseline patient walks with a walker/rollator.  - Examination is with subtle/mild residual left lower extremity weakness and without further focal neurologic deficits. - CT without acute intracranial abnormality but demonstrates atrophy and chronic small-vessel ischemic changes.  - Presentation concerning for TIA, stroke work up pending for further evaluation. - Stroke risk factors: Atrial fibrillation, HTN, HLD and OSA  Impression: Transient left lower extremity weakness Concern for TIA  Recommendations: - HgbA1c, fasting lipid panel - MRI, MRA of the brain without contrast - Frequent neuro checks - Echocardiogram - Carotid dopplers - Prophylactic therapy- Continue home Eliquis BID for atrial fibrillation - Risk factor modification - Telemetry monitoring - PT consult, OT consult, Speech consult - Stroke team to follow - Modified permissive HTN protocol given anticoagulation and advanced age. Treat if SBP >180  Anibal Henderson, AGAC-NP [facility] Pager: [phone]  I have seen and examined the patient. I have formulated the assessment and recommendations. My exam findings were observed and documented by Anibal Henderson, NP.  Electronically signed: Dr. Kerney Elbe   .

## 2020-09-07 NOTE — ED Notes (Signed)
Patient transported to MRI 

## 2020-09-07 NOTE — ED Notes (Signed)
Pt remains in MRI at this time  
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
## 2020-09-07 NOTE — ED Triage Notes (Signed)
No arm drift. Alert and oriented x 3

## 2020-09-07 NOTE — ED Notes (Signed)
Received verbal report from [employer]

## 2020-09-08 ENCOUNTER — Observation Stay (HOSPITAL_COMMUNITY): Payer: Medicare Other

## 2020-09-08 ENCOUNTER — Inpatient Hospital Stay (HOSPITAL_COMMUNITY): Payer: Medicare Other

## 2020-09-08 DIAGNOSIS — G459 Transient cerebral ischemic attack, unspecified: Secondary | ICD-10-CM | POA: Diagnosis not present

## 2020-09-08 DIAGNOSIS — K219 Gastro-esophageal reflux disease without esophagitis: Secondary | ICD-10-CM | POA: Diagnosis present

## 2020-09-08 DIAGNOSIS — E7849 Other hyperlipidemia: Secondary | ICD-10-CM

## 2020-09-08 DIAGNOSIS — E785 Hyperlipidemia, unspecified: Secondary | ICD-10-CM | POA: Diagnosis present

## 2020-09-08 DIAGNOSIS — N281 Cyst of kidney, acquired: Secondary | ICD-10-CM | POA: Diagnosis present

## 2020-09-08 DIAGNOSIS — N99840 Postprocedural hematoma of a genitourinary system organ or structure following a genitourinary system procedure: Secondary | ICD-10-CM | POA: Diagnosis not present

## 2020-09-08 DIAGNOSIS — J9811 Atelectasis: Secondary | ICD-10-CM | POA: Diagnosis not present

## 2020-09-08 DIAGNOSIS — I951 Orthostatic hypotension: Secondary | ICD-10-CM | POA: Diagnosis present

## 2020-09-08 DIAGNOSIS — J90 Pleural effusion, not elsewhere classified: Secondary | ICD-10-CM | POA: Diagnosis not present

## 2020-09-08 DIAGNOSIS — N189 Chronic kidney disease, unspecified: Secondary | ICD-10-CM | POA: Diagnosis not present

## 2020-09-08 DIAGNOSIS — I5033 Acute on chronic diastolic (congestive) heart failure: Secondary | ICD-10-CM

## 2020-09-08 DIAGNOSIS — E875 Hyperkalemia: Secondary | ICD-10-CM | POA: Diagnosis present

## 2020-09-08 DIAGNOSIS — N183 Chronic kidney disease, stage 3 unspecified: Secondary | ICD-10-CM | POA: Diagnosis not present

## 2020-09-08 DIAGNOSIS — I509 Heart failure, unspecified: Secondary | ICD-10-CM | POA: Diagnosis not present

## 2020-09-08 DIAGNOSIS — R531 Weakness: Secondary | ICD-10-CM | POA: Diagnosis not present

## 2020-09-08 DIAGNOSIS — Z20822 Contact with and (suspected) exposure to covid-19: Secondary | ICD-10-CM | POA: Diagnosis present

## 2020-09-08 DIAGNOSIS — K760 Fatty (change of) liver, not elsewhere classified: Secondary | ICD-10-CM | POA: Diagnosis present

## 2020-09-08 DIAGNOSIS — G4733 Obstructive sleep apnea (adult) (pediatric): Secondary | ICD-10-CM | POA: Diagnosis present

## 2020-09-08 DIAGNOSIS — R29898 Other symptoms and signs involving the musculoskeletal system: Secondary | ICD-10-CM

## 2020-09-08 DIAGNOSIS — N059 Unspecified nephritic syndrome with unspecified morphologic changes: Secondary | ICD-10-CM | POA: Diagnosis not present

## 2020-09-08 DIAGNOSIS — S37012A Minor contusion of left kidney, initial encounter: Secondary | ICD-10-CM | POA: Diagnosis not present

## 2020-09-08 DIAGNOSIS — I495 Sick sinus syndrome: Secondary | ICD-10-CM | POA: Diagnosis present

## 2020-09-08 DIAGNOSIS — E871 Hypo-osmolality and hyponatremia: Secondary | ICD-10-CM | POA: Diagnosis present

## 2020-09-08 DIAGNOSIS — R0602 Shortness of breath: Secondary | ICD-10-CM | POA: Diagnosis not present

## 2020-09-08 DIAGNOSIS — F32A Depression, unspecified: Secondary | ICD-10-CM | POA: Diagnosis present

## 2020-09-08 DIAGNOSIS — K7030 Alcoholic cirrhosis of liver without ascites: Secondary | ICD-10-CM | POA: Diagnosis present

## 2020-09-08 DIAGNOSIS — R319 Hematuria, unspecified: Secondary | ICD-10-CM | POA: Diagnosis not present

## 2020-09-08 DIAGNOSIS — D62 Acute posthemorrhagic anemia: Secondary | ICD-10-CM | POA: Diagnosis present

## 2020-09-08 DIAGNOSIS — I480 Paroxysmal atrial fibrillation: Secondary | ICD-10-CM | POA: Diagnosis not present

## 2020-09-08 DIAGNOSIS — I5032 Chronic diastolic (congestive) heart failure: Secondary | ICD-10-CM

## 2020-09-08 DIAGNOSIS — G4700 Insomnia, unspecified: Secondary | ICD-10-CM | POA: Diagnosis present

## 2020-09-08 DIAGNOSIS — K8020 Calculus of gallbladder without cholecystitis without obstruction: Secondary | ICD-10-CM | POA: Diagnosis not present

## 2020-09-08 DIAGNOSIS — F1021 Alcohol dependence, in remission: Secondary | ICD-10-CM | POA: Diagnosis present

## 2020-09-08 DIAGNOSIS — N179 Acute kidney failure, unspecified: Secondary | ICD-10-CM | POA: Diagnosis not present

## 2020-09-08 DIAGNOSIS — D631 Anemia in chronic kidney disease: Secondary | ICD-10-CM | POA: Diagnosis present

## 2020-09-08 DIAGNOSIS — N400 Enlarged prostate without lower urinary tract symptoms: Secondary | ICD-10-CM | POA: Diagnosis present

## 2020-09-08 DIAGNOSIS — N1831 Chronic kidney disease, stage 3a: Secondary | ICD-10-CM | POA: Diagnosis present

## 2020-09-08 DIAGNOSIS — F419 Anxiety disorder, unspecified: Secondary | ICD-10-CM | POA: Diagnosis present

## 2020-09-08 DIAGNOSIS — I130 Hypertensive heart and chronic kidney disease with heart failure and stage 1 through stage 4 chronic kidney disease, or unspecified chronic kidney disease: Secondary | ICD-10-CM | POA: Diagnosis not present

## 2020-09-08 LAB — URINALYSIS, ROUTINE W REFLEX MICROSCOPIC
Bilirubin Urine: NEGATIVE
Glucose, UA: NEGATIVE mg/dL
Ketones, ur: NEGATIVE mg/dL
Leukocytes,Ua: NEGATIVE
Nitrite: NEGATIVE
Protein, ur: 100 mg/dL — AB
RBC / HPF: >50 RBC/hpf — ABNORMAL HIGH (ref 0–5)
Specific Gravity, Urine: 1.005 (ref 1.005–1.030)
pH: 6 (ref 5.0–8.0)

## 2020-09-08 LAB — BASIC METABOLIC PANEL
Anion gap: 12 (ref 5–15)
BUN: 29 mg/dL — ABNORMAL HIGH (ref 8–23)
CO2: 19 mmol/L — ABNORMAL LOW (ref 22–32)
Calcium: 8 mg/dL — ABNORMAL LOW (ref 8.9–10.3)
Chloride: 101 mmol/L (ref 98–111)
Creatinine, Ser: 2.9 mg/dL — ABNORMAL HIGH (ref 0.61–1.24)
GFR, Estimated: 21 mL/min — ABNORMAL LOW (ref >60)
Glucose, Bld: 91 mg/dL (ref 70–99)
Potassium: 4.1 mmol/L (ref 3.5–5.1)
Sodium: 132 mmol/L — ABNORMAL LOW (ref 135–145)

## 2020-09-08 LAB — CREATININE, URINE, RANDOM: Creatinine, Urine: 80.04 mg/dL

## 2020-09-08 LAB — ECHOCARDIOGRAM COMPLETE
Area-P 1/2: 2.11 cm2
Height: 72 in
S' Lateral: 3 cm
Weight: 4042.35 [oz_av]

## 2020-09-08 LAB — CBC
HCT: 25.4 % — ABNORMAL LOW (ref 39.0–52.0)
Hemoglobin: 8.5 g/dL — ABNORMAL LOW (ref 13.0–17.0)
MCH: 33.2 pg (ref 26.0–34.0)
MCHC: 33.5 g/dL (ref 30.0–36.0)
MCV: 99.2 fL (ref 80.0–100.0)
Platelets: 239 10*3/uL (ref 150–400)
RBC: 2.56 MIL/uL — ABNORMAL LOW (ref 4.22–5.81)
RDW: 14.8 % (ref 11.5–15.5)
WBC: 8 10*3/uL (ref 4.0–10.5)
nRBC: 0 % (ref 0.0–0.2)

## 2020-09-08 LAB — HEMOGLOBIN A1C
Hgb A1c MFr Bld: 4.4 % — ABNORMAL LOW (ref 4.8–5.6)
Mean Plasma Glucose: 79.58 mg/dL

## 2020-09-08 LAB — OSMOLALITY, URINE: Osmolality, Ur: 209 mOsm/kg — ABNORMAL LOW (ref 300–900)

## 2020-09-08 LAB — SODIUM, URINE, RANDOM: Sodium, Ur: 12 mmol/L

## 2020-09-08 LAB — BRAIN NATRIURETIC PEPTIDE: B Natriuretic Peptide: 710.7 pg/mL — ABNORMAL HIGH (ref 0.0–100.0)

## 2020-09-08 LAB — OSMOLALITY: Osmolality: 286 mOsm/kg (ref 275–295)

## 2020-09-08 MED ORDER — SODIUM CHLORIDE 0.9 % IV SOLN: INTRAVENOUS | Status: DC

## 2020-09-08 MED ORDER — FUROSEMIDE 10 MG/ML IJ SOLN
80.0000 mg | Freq: Once | INTRAMUSCULAR | Status: AC
  Administered 2020-09-08: 80 mg via INTRAVENOUS
  Filled 2020-09-08: qty 8

## 2020-09-08 MED ORDER — HEPARIN (PORCINE) 25000 UT/250ML-% IV SOLN
1500.0000 [IU]/h | INTRAVENOUS | Status: DC
  Administered 2020-09-09: 1500 [IU]/h via INTRAVENOUS
  Filled 2020-09-08: qty 250

## 2020-09-08 NOTE — Progress Notes (Signed)
09/08/20 1408  PT Evaluation Information  Last PT Received On 09/08/20  Assistance Needed +1  History of Present Illness 80 y.o. male admitted with L leg weaknes. MRI (-) for stroke. Work up underway for TIA per neurology. PMH significant for atrial fibrillation on Eliquis, HTN, CHF, ETOH abuse (in remission), HLD, OSA, GIB, B THA and cardiac arrest.  Precautions  Precautions Fall  Restrictions  Weight Bearing Restrictions No  Home Living  Family/patient expects to be discharged to: Private residence  Living Arrangements Alone  Available Help at Discharge Family;Friend(s);Available PRN/intermittently  Type of Home House  Home Access Level entry  Home Layout Two level;Able to live on main level with bedroom/bathroom;Full bath on main level  Print production planner Handicapped height  Bathroom Accessibility Yes  Home Equipment Walker - 2 wheels;Walker - 4 wheels;Wheelchair - manual;Hand held shower head;Adaptive equipment;Shower seat;Shower seat - built in  [place] shoe horn;Reacher;Long-handled sponge  Prior Function  Level of Independence Independent with assistive device(s)  Comments uses a rollator; furniture "surfing"; driving; Plans to go into [facility]) in June/July  Communication  Communication No difficulties  Pain Assessment  Pain Assessment 0-10  Pain Score 3  Pain Location L thigh  Pain Descriptors / Indicators Sharp;Shooting  Pain Intervention(s) Limited activity within patient's tolerance;Monitored during session;Repositioned  Cognition  Arousal/Alertness Awake/alert  Behavior During Therapy WFL for tasks assessed/performed  Overall Cognitive Status No family/caregiver present to determine baseline cognitive functioning  General Comments Decreased safety/judgement however most likely baseline  Upper Extremity Assessment  Upper Extremity Assessment Defer to OT evaluation  Lower Extremity  Assessment  Lower Extremity Assessment Generalized weakness;RLE deficits/detail;LLE deficits/detail  RLE Deficits / Details Hip weakness noted when taking steps.  LLE Deficits / Details Complaining of L thigh pain. Hip weakness noted as well.  Cervical / Trunk Assessment  Cervical / Trunk Assessment Other exceptions ("history of back problems")  Bed Mobility  Overal bed mobility Modified Independent  Transfers  Overall transfer level Needs assistance  Equipment used Rolling walker (2 wheeled)  Transfers Sit to/from Stand  Sit to Stand Mod assist  General transfer comment Required mod A for lift assist from lower surface. When standing from chair requiring min guard to supervision for safety.  Ambulation/Gait  Ambulation/Gait assistance Min assist;Min guard  Gait Distance (Feet) 20 Feet  Assistive device Rolling walker (2 wheeled)  Gait Pattern/deviations Step-through pattern;Decreased stride length  General Gait Details Noted shakiness in BLE and hip flexor weakness. Unsteady, requiring min A initially and then progressed to min guard with increased distance.  Balance  Overall balance assessment Needs assistance;History of Falls  Sitting-balance support No upper extremity supported;Feet supported  Sitting balance-Leahy Scale Good  Standing balance support Bilateral upper extremity supported  Standing balance-Leahy Scale Poor  Standing balance comment Reliant on BUE support  High Level Balance Comments 5X STS performed and required 37.56 seconds to perform. Indicating higher fall risk.  General Comments  General comments (skin integrity, edema, etc.) recent fall @ 5 wks ago wtih hospital admission. States he "had a concussion"  PT - End of Session  Equipment Utilized During Treatment Gait belt  Activity Tolerance Patient tolerated treatment well  Patient left in chair;with call bell/phone within reach;with chair alarm set  Nurse Communication Mobility status  PT Assessment  PT  Recommendation/Assessment Patient needs continued PT services  PT Visit Diagnosis Unsteadiness on feet (R26.81);Muscle weakness (generalized) (M62.81);History of falling (Z91.81)  PT Problem List Decreased strength;Decreased  balance;Decreased activity tolerance;Decreased mobility;Decreased knowledge of use of DME;Decreased knowledge of precautions;Decreased safety awareness  PT Plan  PT Frequency (ACUTE ONLY) Min 3X/week  PT Treatment/Interventions (ACUTE ONLY) DME instruction;Gait training;Therapeutic activities;Functional mobility training;Balance training;Therapeutic exercise;Patient/family education  AM-PAC PT "6 Clicks" Mobility Outcome Measure (Version 2)  Help needed turning from your back to your side while in a flat bed without using bedrails? 4  Help needed moving from lying on your back to sitting on the side of a flat bed without using bedrails? 4  Help needed moving to and from a bed to a chair (including a wheelchair)? 3  Help needed standing up from a chair using your arms (e.g., wheelchair or bedside chair)? 2  Help needed to walk in hospital room? 3  Help needed climbing 3-5 steps with a railing?  2  6 Click Score 18  Consider Recommendation of Discharge To: Home with [facility]  PT Recommendation  Follow Up Recommendations Home health PT;Supervision for mobility/OOB  PT equipment None recommended by PT  Individuals Consulted  Consulted and Agree with Results and Recommendations Patient  Acute Rehab PT Goals  Patient Stated Goal to return home with assistance of church friends  PT Goal Formulation With patient  Time For Goal Achievement 09/22/20  Potential to Achieve Goals Fair  PT Time Calculation  PT Start Time (ACUTE ONLY) 1215  PT Stop Time (ACUTE ONLY) 1235  PT Time Calculation (min) (ACUTE ONLY) 20 min  PT General Charges  $$ ACUTE PT VISIT 1 Visit  PT Evaluation  $PT Eval Moderate Complexity 1 Mod  Written Expression  Dominant Hand Right   Pt admitted secondary to  problem above with deficits above. Pt initially requiring mod A for transfers, however, able to progress to only requiring supervision with transfers by end of session. Required min to min guard A for steadying assist during gait using RW. 5X sit to stand was performed and pt required 37.56 sec indicating higher fall risk. Pt reports he plans to go home and reports his sister can come and stay with him if needed. Also reports he has a fall alert system if needed. Pt plans to move into an ALF at [facility] in @ 2 months.States his son is planning to visit in @ 2 weeks to help him out and that his "church family/friends" can help out as needed at this time as well. Recommending HHPT at d/c. Will continue to follow acutely.   Reuel Derby, PT, DPT  Acute Rehabilitation Services  Pager: [phone] Office: [phone]

## 2020-09-08 NOTE — Progress Notes (Incomplete)
Progress Note  Patient Name: Darryl Diaz Date of Encounter: 09/08/2020  [facility] Cardiologist: Sherren Mocha, MD ***  Subjective   ***  Inpatient Medications    Scheduled Meds: . amiodarone  200 mg Oral Daily  . atorvastatin  20 mg Oral Daily  . escitalopram  10 mg Oral Daily  . ferrous sulfate  325 mg Oral BID WC  . folic acid  1 mg Oral Daily  . pantoprazole  40 mg Oral Daily  . tamsulosin  0.4 mg Oral QPM   Continuous Infusions:  PRN Meds: acetaminophen **OR** acetaminophen (TYLENOL) oral liquid 160 mg/5 mL **OR** acetaminophen, senna-docusate   Vital Signs    Vitals:   09/08/20 0830 09/08/20 1331 09/08/20 1616 09/08/20 1920  BP:  (!) 153/59 (!) 160/81 (!) 151/75  Pulse:  84 73 79  Resp:  18 20 19   Temp:  97.7 F (36.5 C) (!) 97.5 F (36.4 C) 98.4 F (36.9 C)  TempSrc:  Oral Oral Oral  SpO2:  98% 99% 100%  Weight: 114.6 kg     Height: 6' (1.829 m)       Intake/Output Summary (Last 24 hours) at 09/08/2020 2028 Last data filed at 09/08/2020 1746 Gross per 24 hour  Intake 500 ml  Output 450 ml  Net 50 ml   Last 3 Weights 09/08/2020 08/03/2020 07/28/2020  Weight (lbs) 252 lb 10.4 oz 234 lb 231 lb 12.8 oz  Weight (kg) 114.6 kg 106.142 kg 105.144 kg      Telemetry    *** - Personally Reviewed  ECG    *** - Personally Reviewed  Physical Exam  *** GEN: No acute distress.   Neck: No JVD Cardiac: RRR, no murmurs, rubs, or gallops.  Respiratory: Clear to auscultation bilaterally. GI: Soft, nontender, non-distended  MS: No edema; No deformity. Neuro:  Nonfocal  Psych: Normal affect   Labs    High Sensitivity Troponin:   Recent Labs  Lab 09/07/20 1656  TROPONINIHS 8      Chemistry Recent Labs  Lab 09/03/20 1612 09/07/20 1656 09/08/20 0510  NA 133* 127* 132*  K 4.6 4.6 4.1  CL 98 98 101  CO2 19* 19* 19*  GLUCOSE 85 91 91  BUN 27 28* 29*  CREATININE 1.78* 2.82* 2.90*  CALCIUM 8.1* 8.1* 8.0*  PROT  --  5.2*  --   ALBUMIN  --   2.4*  --   AST  --  19  --   ALT  --  11  --   ALKPHOS  --  106  --   BILITOT  --  1.2  --   GFRNONAA  --  22* 21*  ANIONGAP  --  10 12     Hematology Recent Labs  Lab 09/07/20 1656 09/08/20 0510  WBC 7.4 8.0  RBC 2.41* 2.56*  HGB 8.0* 8.5*  HCT 23.7* 25.4*  MCV 98.3 99.2  MCH 33.2 33.2  MCHC 33.8 33.5  RDW 15.0 14.8  PLT 230 239    BNPNo results for input(s): BNP, PROBNP in the last 168 hours.   DDimer No results for input(s): DDIMER in the last 168 hours.   Radiology    MR ANGIO HEAD WO CONTRAST  Result Date: 09/07/2020 CLINICAL DATA:  Left lower extremity weakness beginning 3 hours ago. Negative CT evaluation. EXAM: MRI HEAD WITHOUT CONTRAST MRA HEAD WITHOUT CONTRAST TECHNIQUE: Multiplanar, multiecho pulse sequences of the brain and surrounding structures were obtained without intravenous contrast. Angiographic images of  the head were obtained using MRA technique without contrast. COMPARISON:  Head CT same day FINDINGS: MRI HEAD FINDINGS Brain: Diffusion imaging does not show any acute or subacute infarction. Brainstem is normal. There is cerebellar atrophy without focal insult. Cerebral hemispheres show generalized atrophy with moderate chronic small-vessel ischemic changes of the white matter. No cortical or large vessel territory infarction. No mass lesion, hemorrhage, hydrocephalus or extra-axial collection. Vascular: Major vessels at the base of the brain show flow. Skull and upper cervical spine: Negative Sinuses/Orbits: Clear/normal Other: None MRA HEAD FINDINGS Both internal carotid arteries are patent through the skull base and siphon regions. Ordinary siphon atherosclerotic change. The anterior and middle cerebral vessels are patent without identifiable branch vessel occlusion or correctable proximal stenosis. No aneurysm or vascular malformation. Both vertebral arteries are patent to the basilar. The right is a tiny vessel. No basilar stenosis. Superior cerebellar and  posterior cerebral vessels show normal flow. IMPRESSION: MRI head: No acute finding. Generalized atrophy. Chronic small-vessel ischemic changes of the white matter. MRA head: No sign of large vessel occlusion. No significant proximal stenosis. Electronically Signed   By: Nelson Chimes M.D.   On: 09/07/2020 19:38   MR BRAIN WO CONTRAST  Result Date: 09/07/2020 CLINICAL DATA:  Left lower extremity weakness beginning 3 hours ago. Negative CT evaluation. EXAM: MRI HEAD WITHOUT CONTRAST MRA HEAD WITHOUT CONTRAST TECHNIQUE: Multiplanar, multiecho pulse sequences of the brain and surrounding structures were obtained without intravenous contrast. Angiographic images of the head were obtained using MRA technique without contrast. COMPARISON:  Head CT same day FINDINGS: MRI HEAD FINDINGS Brain: Diffusion imaging does not show any acute or subacute infarction. Brainstem is normal. There is cerebellar atrophy without focal insult. Cerebral hemispheres show generalized atrophy with moderate chronic small-vessel ischemic changes of the white matter. No cortical or large vessel territory infarction. No mass lesion, hemorrhage, hydrocephalus or extra-axial collection. Vascular: Major vessels at the base of the brain show flow. Skull and upper cervical spine: Negative Sinuses/Orbits: Clear/normal Other: None MRA HEAD FINDINGS Both internal carotid arteries are patent through the skull base and siphon regions. Ordinary siphon atherosclerotic change. The anterior and middle cerebral vessels are patent without identifiable branch vessel occlusion or correctable proximal stenosis. No aneurysm or vascular malformation. Both vertebral arteries are patent to the basilar. The right is a tiny vessel. No basilar stenosis. Superior cerebellar and posterior cerebral vessels show normal flow. IMPRESSION: MRI head: No acute finding. Generalized atrophy. Chronic small-vessel ischemic changes of the white matter. MRA head: No sign of large vessel  occlusion. No significant proximal stenosis. Electronically Signed   By: Nelson Chimes M.D.   On: 09/07/2020 19:38   US RENAL  Result Date: 09/07/2020 CLINICAL DATA:  Acute renal insufficiency EXAM: RENAL / URINARY TRACT ULTRASOUND COMPLETE COMPARISON:  03/16/2020 FINDINGS: Right Kidney: Renal measurements: 5.1 x 12.3 x 6.4 cm = volume: 206.3 mL. Echogenicity within normal limits. No mass or hydronephrosis visualized. Left Kidney: Renal measurements: 13.4 x 6.4 by 6.4 cm = volume: 286.5 mL. Normal renal cortical echotexture. 1.6 cm cyst lower pole. No hydronephrosis or nephrolithiasis. Bladder: Appears normal for degree of bladder distention. Other: None. IMPRESSION: 1. Small simple left renal cyst. Otherwise unremarkable renal ultrasound. Electronically Signed   By: Randa Ngo M.D.   On: 09/07/2020 23:43   DG CHEST PORT 1 VIEW  Result Date: 09/08/2020 CLINICAL DATA:  Short of breath EXAM: PORTABLE CHEST 1 VIEW COMPARISON:  03/24/2020 FINDINGS: Cardiac enlargement with vascular congestion. Mild perihilar edema.  Bibasilar airspace disease and bilateral effusions have developed in the interval. IMPRESSION: Mild congestive heart failure with bibasilar atelectasis and mild edema. Small bilateral effusions. Electronically Signed   By: Franchot Gallo M.D.   On: 09/08/2020 20:15   ECHOCARDIOGRAM COMPLETE  Result Date: 09/08/2020    ECHOCARDIOGRAM REPORT   Patient Name:   Darryl Diaz Date of Exam: 09/08/2020 Medical Rec #:  [id number]        Height:       72.0 in Accession #:    [id number]       Weight:       234.0 lb Date of Birth:  [birth year]        BSA:          2.278 m Patient Age:    35 years         BP:           167/84 mmHg Patient Gender: M                HR:           80 bpm. Exam Location:  Inpatient Procedure: 2D Echo Indications:    Stroke I63.9  History:        Patient has prior history of Echocardiogram examinations, most                 recent 02/25/2020. Risk Factors:Hypertension and  Dyslipidemia.  Sonographer:    Mikki Santee RDCS (AE) Referring Phys: [id number] [place]  1. Left ventricular ejection fraction, by estimation, is 60 to 65%. The left ventricle has normal function. The left ventricle has no regional wall motion abnormalities. There is mild left ventricular hypertrophy. Left ventricular diastolic parameters are consistent with Grade I diastolic dysfunction (impaired relaxation).  2. Right ventricular systolic function is normal. The right ventricular size is normal.  3. Left atrial size was mildly dilated.  4. The mitral valve is normal in structure. No evidence of mitral valve regurgitation. No evidence of mitral stenosis.  5. The aortic valve is tricuspid. Aortic valve regurgitation is not visualized. Mild aortic valve sclerosis is present, with no evidence of aortic valve stenosis.  6. There is borderline dilatation of the aortic root, measuring 41 mm.  7. The inferior vena cava is normal in size with greater than 50% respiratory variability, suggesting right atrial pressure of 3 mmHg. Conclusion(s)/Recommendation(s): No intracardiac source of embolism detected on this transthoracic study. A transesophageal echocardiogram is recommended to exclude cardiac source of embolism if clinically indicated. FINDINGS  Left Ventricle: Left ventricular ejection fraction, by estimation, is 60 to 65%. The left ventricle has normal function. The left ventricle has no regional wall motion abnormalities. The left ventricular internal cavity size was normal in size. There is  mild left ventricular hypertrophy. Left ventricular diastolic parameters are consistent with Grade I diastolic dysfunction (impaired relaxation). Right Ventricle: The right ventricular size is normal. No increase in right ventricular wall thickness. Right ventricular systolic function is normal. Left Atrium: Left atrial size was mildly dilated. Right Atrium: Right atrial size was normal in size.  Pericardium: There is no evidence of pericardial effusion. Mitral Valve: The mitral valve is normal in structure. Mild mitral annular calcification. No evidence of mitral valve regurgitation. No evidence of mitral valve stenosis. Tricuspid Valve: The tricuspid valve is normal in structure. Tricuspid valve regurgitation is not demonstrated. No evidence of tricuspid stenosis. Aortic Valve: The aortic valve is tricuspid. Aortic valve regurgitation is not visualized. Mild aortic  valve sclerosis is present, with no evidence of aortic valve stenosis. Pulmonic Valve: The pulmonic valve was normal in structure. Pulmonic valve regurgitation is not visualized. No evidence of pulmonic stenosis. Aorta: The aortic root is normal in size and structure. There is borderline dilatation of the aortic root, measuring 41 mm. Venous: The inferior vena cava is normal in size with greater than 50% respiratory variability, suggesting right atrial pressure of 3 mmHg. IAS/Shunts: No atrial level shunt detected by color flow Doppler.  LEFT VENTRICLE PLAX 2D LVIDd:         4.80 cm  Diastology LVIDs:         3.00 cm  LV e' medial:    6.85 cm/s LV PW:         1.20 cm  LV E/e' medial:  9.5 LV IVS:        1.10 cm  LV e' lateral:   10.70 cm/s LVOT diam:     2.40 cm  LV E/e' lateral: 6.1 LV SV:         115 LV SV Index:   50 LVOT Area:     4.52 cm  RIGHT VENTRICLE RV S prime:     11.89 cm/s TAPSE (M-mode): 1.8 cm LEFT ATRIUM             Index       RIGHT ATRIUM           Index LA diam:        4.30 cm 1.89 cm/m  RA Area:     23.20 cm LA Vol (A2C):   95.0 ml 41.71 ml/m RA Volume:   65.80 ml  28.89 ml/m LA Vol (A4C):   82.6 ml 36.27 ml/m LA Biplane Vol: 96.1 ml 42.19 ml/m  AORTIC VALVE LVOT Vmax:   118.00 cm/s LVOT Vmean:  75.900 cm/s LVOT VTI:    0.254 m  AORTA Ao Root diam: 4.10 cm MITRAL VALVE MV Area (PHT): 2.11 cm    SHUNTS MV Decel Time: 359 msec    Systemic VTI:  0.25 m MV E velocity: 65.00 cm/s  Systemic Diam: 2.40 cm MV A velocity:  92.60 cm/s MV E/A ratio:  0.70 Candee Furbish MD Electronically signed by Candee Furbish MD Signature Date/Time: 09/08/2020/12:03:50 PM    Final    CT HEAD CODE STROKE WO CONTRAST  Result Date: 09/07/2020 CLINICAL DATA:  Code stroke.  Left-sided weakness. EXAM: CT HEAD WITHOUT CONTRAST TECHNIQUE: Contiguous axial images were obtained from the base of the skull through the vertex without intravenous contrast. COMPARISON:  07/08/2020 FINDINGS: Brain: Age related atrophy. Chronic small-vessel ischemic changes of the white matter. No sign of acute infarction, mass lesion, hemorrhage, hydrocephalus or extra-axial collection. Vascular: There is atherosclerotic calcification of the major vessels at the base of the brain. Skull: Negative Sinuses/Orbits: Clear/normal Other: None ASPECTS ([place] Stroke Program Early CT Score) - Ganglionic level infarction (caudate, lentiform nuclei, internal capsule, insula, M1-M3 cortex): 7 - Supraganglionic infarction (M4-M6 cortex): 3 Total score (0-10 with 10 being normal): 10 IMPRESSION: 1. No acute finding. Atrophy and chronic small-vessel ischemic change. 2. ASPECTS is 10. 3. These results were communicated to Dr. Cheral Marker at 5:25 pmon 5/2/2022by text page via the [facility] messaging system. Electronically Signed   By: Nelson Chimes M.D.   On: 09/07/2020 17:26   VAS US CAROTID  Result Date: 09/08/2020 Carotid Arterial Duplex Study Patient Name:  Darryl Diaz  Date of Exam:   09/08/2020 Medical Rec #: [id number]  Accession #:    [id number] Date of Birth: Nov 01, 1940         Patient Gender: M Patient Age:   80Y Exam Location:  [facility] Procedure:      VAS US CAROTID Referring Phys: [id number] [facility] --------------------------------------------------------------------------------  Indications:       LLE weakness. Risk Factors:      Hypertension, hyperlipidemia, no history of smoking. Other Factors:     Afib, CHF, HX of cardiac arrest. Limitations        Today's exam was  limited due to the patient's respiratory                    variation, the body habitus of the patient and patient                    movement. Comparison Study:  No previous studies Performing Technologist: Jody Hill RVT, RDMS  Examination Guidelines: A complete evaluation includes B-mode imaging, spectral Doppler, color Doppler, and power Doppler as needed of all accessible portions of each vessel. Bilateral testing is considered an integral part of a complete examination. Limited examinations for reoccurring indications may be performed as noted.  Right Carotid Findings: +----------+--------+--------+--------+------------------+-------------------+           PSV cm/sEDV cm/sStenosisPlaque DescriptionComments            +----------+--------+--------+--------+------------------+-------------------+ CCA Prox  62      9                                                     +----------+--------+--------+--------+------------------+-------------------+ CCA Distal50      10                                                    +----------+--------+--------+--------+------------------+-------------------+ ICA Prox  54      11                                                    +----------+--------+--------+--------+------------------+-------------------+ ICA Distal58      15                                Not well visualized +----------+--------+--------+--------+------------------+-------------------+ ECA       65      0                                                     +----------+--------+--------+--------+------------------+-------------------+ +----------+--------+-------+----------------+-------------------+           PSV cm/sEDV cmsDescribe        Arm Pressure (mmHG) +----------+--------+-------+----------------+-------------------+ Subclavian140            Multiphasic, WNL                    +----------+--------+-------+----------------+-------------------+  +---------+--------+--+--------+-+---------+ VertebralPSV cm/s29EDV cm/s6Antegrade +---------+--------+--+--------+-+---------+  Left  Carotid Findings: +----------+--------+--------+--------+--------------------+-------------------+           PSV cm/sEDV cm/sStenosisPlaque Description  Comments            +----------+--------+--------+--------+--------------------+-------------------+ CCA Prox  76      0                                   intimal thickening  +----------+--------+--------+--------+--------------------+-------------------+ CCA Distal58      0               heterogenous and    intimal thickening                                    smooth                                  +----------+--------+--------+--------+--------------------+-------------------+ ICA Prox  45      11      1-39%   calcific,                                                                 heterogenous and                                                          smooth                                  +----------+--------+--------+--------+--------------------+-------------------+ ICA Distal62      14                                  Not well visualized +----------+--------+--------+--------+--------------------+-------------------+ ECA       68      0                                                       +----------+--------+--------+--------+--------------------+-------------------+ +----------+--------+--------+----------------+-------------------+           PSV cm/sEDV cm/sDescribe        Arm Pressure (mmHG) +----------+--------+--------+----------------+-------------------+ [id number]             Multiphasic, WNL                    +----------+--------+--------+----------------+-------------------+ +---------+--------+--------+--------------+ VertebralPSV cm/sEDV cm/sNot identified +---------+--------+--------+--------------+   Summary: Right  Carotid: The extracranial vessels were near-normal with only minimal wall                thickening or plaque. Left Carotid: Velocities in the left ICA are consistent with a 1-39% stenosis.               Non-hemodynamically significant plaque <50% noted  in the CCA. The               ECA appears <50% stenosed. Vertebrals:  Right vertebral artery demonstrates antegrade flow. Left vertebral              artery was not visualized. Subclavians: Normal flow hemodynamics were seen in bilateral subclavian              arteries. *See table(s) above for measurements and observations.  Electronically signed by Curt Jews MD on 09/08/2020 at 8:19:14 PM.    Final     Cardiac Studies   Echo: 09/08/20 IMPRESSIONS  1. Left ventricular ejection fraction, by estimation, is 60 to 65%. The  left ventricle has normal function. The left ventricle has no regional  wall motion abnormalities. There is mild left ventricular hypertrophy.  Left ventricular diastolic parameters  are consistent with Grade I diastolic dysfunction (impaired relaxation).  2. Right ventricular systolic function is normal. The right ventricular  size is normal.  3. Left atrial size was mildly dilated.  4. The mitral valve is normal in structure. No evidence of mitral valve  regurgitation. No evidence of mitral stenosis.  5. The aortic valve is tricuspid. Aortic valve regurgitation is not  visualized. Mild aortic valve sclerosis is present, with no evidence of  aortic valve stenosis.  6. There is borderline dilatation of the aortic root, measuring 41 mm.  7. The inferior vena cava is normal in size with greater than 50%  respiratory variability, suggesting right atrial pressure of 3 mmHg.   Patient Profile     80 y.o. malewith a hx of paroxysmal atrial fibrillation,symptomatic bradycardia,hypertension, hyperlipidemia, GI bleed secondary to Mallory-Weiss tear 02/2019, hepatic cirrhosis, sleep apnea, diastolicheart failure, and history  of recurrent falls/orthostasis on midodrine who presented to the ED after developing LLE weakness while driving. Cardiology is consulted for concern for acute on chronic diastolic HF exacerbation with rising Cr.  Assessment & Plan    1.  Acute on chronic diastolic heart failure: Reports ongoing dyspnea, with slightly worsened lower extremity edema.  Reports being compliant with Lasix prior to admission.  Clearly volume overloaded on exam.  Echocardiogram with EF of 60 to 37%, grade 1 diastolic dysfunction right atrial pressure of 3 mmHg. -- Check BMP, chest x-ray -- Start with IV Lasix 80 mg x 1 and follow response given elevated creatinine.  Hopefully will start to have some output, if no significant response and rising creatinine may need to involve nephrology  2.  Paroxysmal atrial fibrillation: Currently in sinus rhythm.  Has been compliant with amiodarone and apixaban prior to admission -- With notable hematuria we will hold apixaban, pending work-up -- Continue amiodarone  3.  Hematuria: Denies any episodes of hematuria prior to admission.  Does have notable bright red blood noted in canister at bedside. -- UA and urine culture -- As above holding apixaban  4.  Hypertension, though with episodes of orthostatic hypotension: Given his systolic blood pressures have been in the 150-160 range, will hold midodrine for now  5.  TIA: Thus far CT head, MRI negative for acute emboli -- Work-up per primary  6.  CKD with AKI: Creatinine 1.7 back on 4/28.  2.8>> 2.9 this admission. -- Above we will trial high-dose of Lasix and follow response.  If creatinine continues to rise will likely need nephrology involvement -- normal renal US  7.  Anemia: Hemoglobin 8>>8.5.  Notable hematuria -- Daily CBC, apixaban held  {Are we signing off  today?:[id number]}  For questions or updates, please contact [place] Please consult [email or web address] for contact info under        Signed, Freada Bergeron, MD  09/08/2020, 8:28 PM

## 2020-09-08 NOTE — Progress Notes (Signed)
PROGRESS NOTE  Darryl Diaz  DOB: May 12, 1940  PCP: [name], MD ZES:[id number]  DOA: 09/07/2020  LOS: 0 days   Chief Complaint  Patient presents with  . Weakness  . Shortness of Breath  . Atrial Fibrillation   Brief narrative: Darryl Diaz is a 80 y.o. male with PMH significant for paroxysmal atrial fibrillation on Eliquis, tachy-brady syndrome, chronic diastolic CHF, orthostatic hypotension, hyperlipidemia, iron deficiency anemia, chronic bilateral lower extremity edema, and depression/anxiety. Patient presented to the ED on 5/2 from home for an acute left lower extremity weakness which he noticed while driving his car. While in the ED, he felt gradual improvement in the weakness but not back to baseline At baseline, walks with a Rollator, planning to move to an ALF in next 1 to 2 months.  He was recently started on Lasix by his cardiologist because of worsening pedal edema.  In the ED, patient was afebrile, hemodynamically stable Labs with sodium level low at 127, creatinine elevated at 2.82 compared to his baseline of 1.78, hemoglobin low at 8 CT abnormal Neurology was consulted from the ED MRI brain without contrast was negative for acute finding.  MRA head was negative for large vessel occlusion or significant proximal stenosis. Admitted to hospitalist service for TIA work-up  Subjective: Patient was seen and examined this afternoon.  Pleasant elderly Caucasian male.  Lying down in bed.  Not in distress.  Able to recall history by self. Chart reviewed Blood pressure elevated to 167/84 this morning Labs this morning with sodium levels improved to 132, creatinine remains elevated at 2.9, hemoglobin remains low at 8.5  Assessment/Plan: Transient weakness of left lower extremity TIA -Currently being worked up as a TIA -MRI brain without contrast was negative for acute finding. MRA head was negative for large vessel occlusion or significant proximal  stenosis. -Echocardiogram with EF 60 to 65%, no cardiac source of embolism -Carotid duplex did not show any significant stenosis. -A1c 4.4, HDL 55, LDL 46 -Continue Eliquis -Monitor on telemetry, continue neurochecks -PT/OT/SLP eval obtained.  Dyspnea on exertion Chronic diastolic CHF History of orthostatic hypotension Chronic bilateral lower extremity edema - patient has progressively worsening dyspnea on exertion.  He was seen by cardiologist Dr. Burt Knack on 3/28 and was placed on scheduled Lasix.  -Despite AKI, patient still seems to have bilateral pedal edema and looks hypervolemic. -5/3, echocardiogram with EF 60 to 65%, mild LVH, grade 1 diastolic dysfunction  -Home meds include Lasix 40 mg daily, midodrine 2.5 mg 3 times daily -Currently on midodrine.  Lasix is on hold because of AKI -We will discuss with cardiology.  Acute kidney injury: -Baseline creatinine seems normal in February 2022 but seems to have progressively worsening renal function over the last 2 months. -Presented with a creatinine elevated to 1.78 on 4/28 and up to 2.82 on 5/2. -On admission, patient was started on IV fluid despite which creatinine did not improve.  Patient clinically looks volume overloaded. -Renal ultrasound normal. Recent Labs    03/18/20 0019 03/19/20 0024 03/20/20 0019 04/10/20 2633 06/03/20 1217 06/16/20 0858 07/08/20 0047 09/03/20 1612 09/07/20 1656 09/08/20 0510  BUN 18 17 15 13 12 16 16 27  28* 29*  CREATININE 1.20 1.32* 1.06 1.09 1.04 0.92 1.40* 1.78* 2.82* 2.90*   Acute hyponatremia -Sodium level was low at 127 at presentation.  Improving now, 132 this morning. Recent Labs  Lab 09/03/20 1612 09/07/20 1656 09/08/20 0510  NA 133* 127* 132*   Acute drop in hemoglobin on  chronic anemia GERD -Hemoglobin between 10-11 at baseline.  Presented with hemoglobin low at 8.  No active bleeding.   -Continue Protonix. -Continue to monitor. 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  Continue amiodarone and Eliquis. -continue to monitor in telemetry  Hyperlipidemia Continue atorvastatin  Depression/anxiety Continue Lexapro.  BPH Continue Flomax.  Mobility: Encourage ambulation.  PT eval Code Status:   Code Status: Full Code  Nutritional status: Body mass index is 34.27 kg/m.     Diet Order            Diet renal with fluid restriction Fluid restriction: 1200 mL Fluid; Room service appropriate? Yes; Fluid consistency: Thin  Diet effective now                 DVT prophylaxis:  apixaban (ELIQUIS) tablet 5 mg   Antimicrobials:  None Fluid: None Consultants: Cardiology called Family Communication:  None  Status is: Inpatient  Remains inpatient appropriate because: Needs further inpatient evaluation and management  Dispo: The patient is from: Home              Anticipated d/c is to: Home, pending PT eval              Patient currently is not medically stable to d/c.   Difficult to place patient No     Infusions:  . sodium chloride      Scheduled Meds: . amiodarone  200 mg Oral Daily  . apixaban  5 mg Oral BID  . atorvastatin  20 mg Oral Daily  . escitalopram  10 mg Oral Daily  . ferrous sulfate  325 mg Oral BID WC  . folic acid  1 mg Oral Daily  . midodrine  2.5 mg Oral TID WC  . pantoprazole  40 mg Oral Daily  . tamsulosin  0.4 mg Oral QPM    Antimicrobials: Anti-infectives (From admission, onward)   None      PRN meds: acetaminophen **OR** acetaminophen (TYLENOL) oral liquid 160 mg/5 mL **OR** acetaminophen, senna-docusate   Objective: Vitals:   09/08/20 0828 09/08/20 1331  BP: (!) 167/84 (!) 153/59  Pulse: 85 84  Resp: 20 18  Temp: 97.6 F (36.4  C) 97.7 F (36.5 C)  SpO2: 100% 98%    Intake/Output Summary (Last 24 hours) at 09/08/2020 1607 Last data filed at 09/08/2020 1100 Gross per 24 hour  Intake 360 ml  Output --  Net 360 ml   Filed Weights   09/08/20 0830  Weight: 114.6 kg   Weight change:  Body mass index is 34.27 kg/m.   Physical Exam: General exam: Pleasant, elderly Caucasian male.  Lying down in bed.  Not in distress Skin: No rashes, lesions or ulcers. HEENT: Atraumatic, normocephalic, no obvious bleeding Lungs: Not on supplemental oxygen.  Clear to auscultation bilaterally CVS: Regular rate and rhythm, no murmur GI/Abd soft, nontender, nondistended, bowel sound present CNS: Alert, awake, oriented x3 Psychiatry: Mood appropriate Extremities: Chronic 1+ bilateral lower extremity wound with stasis changes, no calf tenderness  Data Review: I have personally reviewed the laboratory data and studies available.  Recent Labs  Lab 09/07/20 1656 09/08/20 0510  WBC 7.4 8.0  NEUTROABS 5.9  --   HGB 8.0* 8.5*  HCT 23.7* 25.4*  MCV 98.3 99.2  PLT 230 239   Recent Labs  Lab 09/03/20 1612 09/07/20 1656 09/08/20 0510  NA 133* 127* 132*  K 4.6 4.6 4.1  CL 98 98 101  CO2 19* 19* 19*  GLUCOSE 85 91 91  BUN 27 28* 29*  CREATININE 1.78* 2.82* 2.90*  CALCIUM 8.1* 8.1* 8.0*    F/u labs ordered Unresulted Labs (From admission, onward)          Start     Ordered   09/09/20 0500  CBC with Differential/Platelet  Daily,   R      09/08/20 1607   09/09/20 0355  Basic metabolic panel  Daily,   R      09/08/20 1607   09/09/20 0500  Magnesium  Tomorrow morning,   STAT        09/08/20 1607   09/09/20 0500  Phosphorus  Tomorrow morning,   R        09/08/20 1607   09/08/20 1200  Osmolality  Once,   R        09/08/20 1200          Signed, Terrilee Croak, MD [facility] 09/08/2020
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
## 2020-09-08 NOTE — TOC Initial Note (Signed)
Transition of Care [facility]) - Initial/Assessment Note    Patient Details  Name: Darryl Diaz MRN: [id number] Date of Birth: Jul 17, 1940  [facility]) CM/SW Contact:    Pollie Friar, RN Phone Number: 09/08/2020, 4:15 PM  Clinical Narrative:                 Patient lives at home alone but his sister and BIL are going to stay with him at d/c. His son is then coming from out of town to stay with him as long as needed.  Recommendations for [facility] services. He did not have a preference for [facility] and this was arranged through [facility].  Pt has all needed DME at home.  Pt no longer drives but family is able to provide needed transportation. Pt denies issues with home medications.  Plan is for pt to move into [facility] in July.  TOC following.  Expected Discharge Plan: [place] Barriers to Discharge: Continued Medical Work up   Patient Goals and CMS Choice   CMS Medicare.gov Compare Post Acute Care list provided to:: Patient Choice offered to / list presented to : Patient  Expected Discharge Plan and Services Expected Discharge Plan: [place]   Discharge Planning Services: CM Consult Post Acute Care Choice: [place] arrangements for the past 2 months: [place] Arranged: PT,OT,Nurse's Aide [place]: [place] Date [place]: 09/08/20   Representative spoke with at [place]: Tommi Rumps  Prior Living Arrangements/Services Living arrangements for the past 2 months: [place] Lives with:: Self Patient language and need for interpreter reviewed:: Yes Do you feel safe going back to the place where you live?: Yes      Need for Family Participation in Patient Care: Yes (Comment) Care giver support system in place?: Yes (comment) Current home services: DME (shower seat/ rollator/ walker/ cane/ wheelchair) Criminal Activity/Legal Involvement Pertinent to  Current Situation/Hospitalization: No - Comment as needed  Activities of Daily Living      Permission Sought/Granted                  Emotional Assessment Appearance:: Appears stated age Attitude/Demeanor/Rapport: Engaged Affect (typically observed): Accepting Orientation: : Oriented to Self,Oriented to Place,Oriented to  Time,Oriented to Situation   Psych Involvement: No (comment)  Admission diagnosis:  Acute kidney injury ([place]) [N17.9] Transient weakness of left lower extremity [R29.898] Patient Active Problem List   Diagnosis Date Noted  . Transient weakness of left lower extremity 09/07/2020  . Heme positive stool 05/14/2020  . Chronic anticoagulation 05/14/2020  . Benign essential HTN 04/27/2020  . Abnormality of gait 04/27/2020  . Salmonella enteritis 03/20/2020  . 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[place]) 03/17/2020  . Yeast infection 03/16/2020  . Diarrhea 03/16/2020  . 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 ([place])   . Allergies   . Anemia, chronic disease   . Chronic diastolic congestive heart failure ([place])   . History of hypertension   . Atrial fibrillation with RVR ([place])   . Debility   . Syncope and collapse 02/22/2020  . AKI (acute kidney injury) ([place]) 02/10/2020  . Unspecified protein-calorie malnutrition ([place]) 02/10/2020  . Weakness 02/06/2020  . Persistent atrial fibrillation with rapid ventricular response ([place]) 02/05/2020  . 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 Bradycardia 04/20/2019  . Atrial fibrillation with rapid ventricular response ([place])   . GI bleed 02/25/2019  . Fall at home, initial encounter 02/25/2019  . Bilateral leg edema 02/25/2019  . Hyponatremia 02/25/2019  . Acute posthemorrhagic anemia 08/11/2018  . Normochromic normocytic anemia 08/11/2018  . Hx of adenomatous colonic polyps 08/10/2017  . PAF (paroxysmal atrial fibrillation) ([place]) 02/05/2017  . Dislocation of hip,  posterior, left, closed ([place]) 04/30/2016  . Hip fracture ([place]) 03/30/2016  . Hypertension 03/30/2016  . Hyperlipidemia 03/30/2016  . GERD (gastroesophageal reflux disease) 03/30/2016  . Osteoarthritis 03/30/2016  . Depression 03/30/2016  . Rhabdomyolysis 03/30/2016  . Leukocytosis 03/30/2016  . Pressure injury of skin 03/30/2016  . Closed fracture of left hip [facility])    PCP:  Tisovec, [name], MD Pharmacy:   [facility], [address] [place] [address] Phone: [phone] Fax: [phone]     Social Determinants of Health (SDOH) Interventions    Readmission Risk Interventions Readmission Risk Prevention Plan 03/18/2020 03/05/2020 04/23/2019  Transportation Screening Complete Complete Complete  PCP or Specialist Appt within 5-7 Days - - Complete  Home Care Screening - - Complete  Medication Review (RN CM) - - Complete  HRI or Home Care Consult Complete Complete -  Social Work Consult for [place] Planning/Counseling Complete Complete -  Palliative Care Screening Not Applicable Not Applicable -  Medication Review Press photographer) Complete Complete -  Some recent data might be hidden
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
## 2020-09-08 NOTE — Progress Notes (Signed)
Carotid duplex has been completed.  Results can be found under chart review under CV PROC. 09/08/2020 11:47 AM Shonn Farruggia RVT, RDMS

## 2020-09-08 NOTE — Progress Notes (Signed)
ANTICOAGULATION CONSULT NOTE - Initial Consult  Pharmacy Consult for Heparin Indication: atrial fibrillation  Allergies  Allergen Reactions  . Penicillins Other (See Comments)    Tolerated Cephalosporin Date: 10/15/19. Allergy is from childhood Did it involve swelling of the face/tongue/throat, SOB, or low BP? Unknown Did it involve sudden or severe rash/hives, skin peeling, or any reaction on the inside of your mouth or nose? Unknown Did you need to seek medical attention at a hospital or doctor's office? Unknown When did it last happen?childhood reaction If all above answers are "NO", may proceed with cephalosporin use.  Marland Kitchen Penicillins     Patient Measurements: Height: 6' (182.9 cm) Weight: 114.6 kg (252 lb 10.4 oz) IBW/kg (Calculated) : 77.6 Heparin Dosing Weight: 102.3 kg  Vital Signs: Temp: 98.4 F (36.9 C) (05/03 1920) Temp Source: Oral (05/03 1920) BP: 151/75 (05/03 1920) Pulse Rate: 79 (05/03 1920)  Labs: Recent Labs    09/07/20 1656 09/08/20 0510  HGB 8.0* 8.5*  HCT 23.7* 25.4*  PLT 230 239  LABPROT 16.8*  --   INR 1.4*  --   CREATININE 2.82* 2.90*  TROPONINIHS 8  --     Estimated Creatinine Clearance: 27 mL/min (A) (by C-G formula based on SCr of 2.9 mg/dL (H)).   Medical History: Past Medical History:  Diagnosis Date  . 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 Anemia   . 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 Bradycardia    low 20's  . Bradycardic cardiac arrest ([place])   . Cardiac arrest ([place]) 04/20/2019   12 min CPR with epi  . Chronic diastolic CHF (congestive heart failure) ([place])   . Fall at home 03/28/2016  . Fatty liver   . Gastritis   . GERD (gastroesophageal reflux disease)   . H/O seasonal allergies   . History of GI bleed   . Hx of adenomatous colonic polyps 08/10/2017  . Hyperlipidemia   . Hypertension   . Hypotension   . Insomnia   . 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 Mallory-Weiss tear    . OA (osteoarthritis)    hands  . OSA (obstructive sleep apnea)    No longer uses CPAP  . Persistent atrial fibrillation with rapid ventricular response ([place]) 02/05/2020  . Recurrent syncope   . 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[facility])     Assessment: 80 y.o. male with history of A. fib on Eliquis, hypertension, hyperlipidemia, OSA, history of GI bleeding, history of a cardiac arrest admitted for transient left lower extremity weakness.  Pharmacy consulted to start heparin for atrial fibrillation. Last dose of Eliquis 5/3 0940.  Will monitor aPTT and Heparin levels until they correlate as eliquis will falsely raise the HL.  Goal of Therapy:  Heparin level 0.3-0.7 units/ml aPTT 66-102 seconds Monitor platelets by anticoagulation protocol: Yes   Plan:  Start heparin infusion at 1500 units/hr Check anti-Xa level and aPTT in 8 hours and daily while on heparin Continue to monitor H&H and platelets  Alanda Slim, PharmD, [facility] Clinical Pharmacist Please see AMION for all Pharmacists' Contact Phone Numbers 09/08/2020, 9:22 PM

## 2020-09-08 NOTE — Progress Notes (Signed)
STROKE TEAM PROGRESS NOTE   SUBJECTIVE (INTERVAL HISTORY) No family is at the bedside.  Overall his condition is stable. His LLE weakness has resolved but he complains of left femur bone pain. He denies any numbness or muscle pain but specifically complaining of femur bony pain.    OBJECTIVE Temp:  [97.5 F (36.4 C)-98.4 F (36.9 C)] 98.4 F (36.9 C) (05/03 1920) Pulse Rate:  [73-90] 79 (05/03 1920) Cardiac Rhythm: Normal sinus rhythm;Ventricular paced (05/03 0857) Resp:  [17-27] 19 (05/03 1920) BP: (100-167)/(49-84) 151/75 (05/03 1920) SpO2:  [94 %-100 %] 100 % (05/03 1920) Weight:  [114.6 kg] 114.6 kg (05/03 0830)  No results for input(s): GLUCAP in the last 168 hours. Recent Labs  Lab 09/03/20 1612 09/07/20 1656 09/08/20 0510  NA 133* 127* 132*  K 4.6 4.6 4.1  CL 98 98 101  CO2 19* 19* 19*  GLUCOSE 85 91 91  BUN 27 28* 29*  CREATININE 1.78* 2.82* 2.90*  CALCIUM 8.1* 8.1* 8.0*   Recent Labs  Lab 09/07/20 1656  AST 19  ALT 11  ALKPHOS 106  BILITOT 1.2  PROT 5.2*  ALBUMIN 2.4*   Recent Labs  Lab 09/07/20 1656 09/08/20 0510  WBC 7.4 8.0  NEUTROABS 5.9  --   HGB 8.0* 8.5*  HCT 23.7* 25.4*  MCV 98.3 99.2  PLT 230 239   No results for input(s): CKTOTAL, CKMB, CKMBINDEX, TROPONINI in the last 168 hours. Recent Labs    09/07/20 1656  LABPROT 16.8*  INR 1.4*   Recent Labs    09/07/20 1853  COLORURINE AMBER*  LABSPEC 1.010  PHURINE 5.0  GLUCOSEU NEGATIVE  HGBUR LARGE*  BILIRUBINUR NEGATIVE  KETONESUR NEGATIVE  PROTEINUR 100*  NITRITE NEGATIVE  LEUKOCYTESUR NEGATIVE       Component Value Date/Time   CHOL 109 09/07/2020 2142   TRIG 40 09/07/2020 2142   HDL 55 09/07/2020 2142   CHOLHDL 2.0 09/07/2020 2142   VLDL 8 09/07/2020 2142   LDLCALC 46 09/07/2020 2142   Lab Results  Component Value Date   HGBA1C 4.4 (L) 09/07/2020      Component Value Date/Time   LABOPIA NONE DETECTED 09/07/2020 1853   COCAINSCRNUR NONE DETECTED 09/07/2020 1853    LABBENZ NONE DETECTED 09/07/2020 1853   AMPHETMU NONE DETECTED 09/07/2020 1853   THCU NONE DETECTED 09/07/2020 1853   LABBARB NONE DETECTED 09/07/2020 1853    Recent Labs  Lab 09/07/20 1700  ETH <10    I have personally reviewed the radiological images below and agree with the radiology interpretations.  MR ANGIO HEAD WO CONTRAST  Result Date: 09/07/2020 CLINICAL DATA:  Left lower extremity weakness beginning 3 hours ago. Negative CT evaluation. EXAM: MRI HEAD WITHOUT CONTRAST MRA HEAD WITHOUT CONTRAST TECHNIQUE: Multiplanar, multiecho pulse sequences of the brain and surrounding structures were obtained without intravenous contrast. Angiographic images of the head were obtained using MRA technique without contrast. COMPARISON:  Head CT same day FINDINGS: MRI HEAD FINDINGS Brain: Diffusion imaging does not show any acute or subacute infarction. Brainstem is normal. There is cerebellar atrophy without focal insult. Cerebral hemispheres show generalized atrophy with moderate chronic small-vessel ischemic changes of the white matter. No cortical or large vessel territory infarction. No mass lesion, hemorrhage, hydrocephalus or extra-axial collection. Vascular: Major vessels at the base of the brain show flow. Skull and upper cervical spine: Negative Sinuses/Orbits: Clear/normal Other: None MRA HEAD FINDINGS Both internal carotid arteries are patent through the skull base and siphon regions. Ordinary siphon  atherosclerotic change. The anterior and middle cerebral vessels are patent without identifiable branch vessel occlusion or correctable proximal stenosis. No aneurysm or vascular malformation. Both vertebral arteries are patent to the basilar. The right is a tiny vessel. No basilar stenosis. Superior cerebellar and posterior cerebral vessels show normal flow. IMPRESSION: MRI head: No acute finding. Generalized atrophy. Chronic small-vessel ischemic changes of the white matter. MRA head: No sign of large  vessel occlusion. No significant proximal stenosis. Electronically Signed   By: Nelson Chimes M.D.   On: 09/07/2020 19:38   MR BRAIN WO CONTRAST  Result Date: 09/07/2020 CLINICAL DATA:  Left lower extremity weakness beginning 3 hours ago. Negative CT evaluation. EXAM: MRI HEAD WITHOUT CONTRAST MRA HEAD WITHOUT CONTRAST TECHNIQUE: Multiplanar, multiecho pulse sequences of the brain and surrounding structures were obtained without intravenous contrast. Angiographic images of the head were obtained using MRA technique without contrast. COMPARISON:  Head CT same day FINDINGS: MRI HEAD FINDINGS Brain: Diffusion imaging does not show any acute or subacute infarction. Brainstem is normal. There is cerebellar atrophy without focal insult. Cerebral hemispheres show generalized atrophy with moderate chronic small-vessel ischemic changes of the white matter. No cortical or large vessel territory infarction. No mass lesion, hemorrhage, hydrocephalus or extra-axial collection. Vascular: Major vessels at the base of the brain show flow. Skull and upper cervical spine: Negative Sinuses/Orbits: Clear/normal Other: None MRA HEAD FINDINGS Both internal carotid arteries are patent through the skull base and siphon regions. Ordinary siphon atherosclerotic change. The anterior and middle cerebral vessels are patent without identifiable branch vessel occlusion or correctable proximal stenosis. No aneurysm or vascular malformation. Both vertebral arteries are patent to the basilar. The right is a tiny vessel. No basilar stenosis. Superior cerebellar and posterior cerebral vessels show normal flow. IMPRESSION: MRI head: No acute finding. Generalized atrophy. Chronic small-vessel ischemic changes of the white matter. MRA head: No sign of large vessel occlusion. No significant proximal stenosis. Electronically Signed   By: Nelson Chimes M.D.   On: 09/07/2020 19:38   US RENAL  Result Date: 09/07/2020 CLINICAL DATA:  Acute renal  insufficiency EXAM: RENAL / URINARY TRACT ULTRASOUND COMPLETE COMPARISON:  03/16/2020 FINDINGS: Right Kidney: Renal measurements: 5.1 x 12.3 x 6.4 cm = volume: 206.3 mL. Echogenicity within normal limits. No mass or hydronephrosis visualized. Left Kidney: Renal measurements: 13.4 x 6.4 by 6.4 cm = volume: 286.5 mL. Normal renal cortical echotexture. 1.6 cm cyst lower pole. No hydronephrosis or nephrolithiasis. Bladder: Appears normal for degree of bladder distention. Other: None. IMPRESSION: 1. Small simple left renal cyst. Otherwise unremarkable renal ultrasound. Electronically Signed   By: Randa Ngo M.D.   On: 09/07/2020 23:43   ECHOCARDIOGRAM COMPLETE  Result Date: 09/08/2020    ECHOCARDIOGRAM REPORT   Patient Name:   Darryl Diaz Date of Exam: 09/08/2020 Medical Rec #:  [id number]        Height:       72.0 in Accession #:    [id number]       Weight:       234.0 lb Date of Birth:  January 29, 1941        BSA:          2.278 m Patient Age:    25 years         BP:           167/84 mmHg Patient Gender: M                HR:  80 bpm. Exam Location:  Inpatient Procedure: 2D Echo Indications:    Stroke I63.9  History:        Patient has prior history of Echocardiogram examinations, most                 recent 02/25/2020. Risk Factors:Hypertension and Dyslipidemia.  Sonographer:    Mikki Santee RDCS (AE) Referring Phys: [id number] [place]  1. Left ventricular ejection fraction, by estimation, is 60 to 65%. The left ventricle has normal function. The left ventricle has no regional wall motion abnormalities. There is mild left ventricular hypertrophy. Left ventricular diastolic parameters are consistent with Grade I diastolic dysfunction (impaired relaxation).  2. Right ventricular systolic function is normal. The right ventricular size is normal.  3. Left atrial size was mildly dilated.  4. The mitral valve is normal in structure. No evidence of mitral valve regurgitation. No evidence of  mitral stenosis.  5. The aortic valve is tricuspid. Aortic valve regurgitation is not visualized. Mild aortic valve sclerosis is present, with no evidence of aortic valve stenosis.  6. There is borderline dilatation of the aortic root, measuring 41 mm.  7. The inferior vena cava is normal in size with greater than 50% respiratory variability, suggesting right atrial pressure of 3 mmHg. Conclusion(s)/Recommendation(s): No intracardiac source of embolism detected on this transthoracic study. A transesophageal echocardiogram is recommended to exclude cardiac source of embolism if clinically indicated. FINDINGS  Left Ventricle: Left ventricular ejection fraction, by estimation, is 60 to 65%. The left ventricle has normal function. The left ventricle has no regional wall motion abnormalities. The left ventricular internal cavity size was normal in size. There is  mild left ventricular hypertrophy. Left ventricular diastolic parameters are consistent with Grade I diastolic dysfunction (impaired relaxation). Right Ventricle: The right ventricular size is normal. No increase in right ventricular wall thickness. Right ventricular systolic function is normal. Left Atrium: Left atrial size was mildly dilated. Right Atrium: Right atrial size was normal in size. Pericardium: There is no evidence of pericardial effusion. Mitral Valve: The mitral valve is normal in structure. Mild mitral annular calcification. No evidence of mitral valve regurgitation. No evidence of mitral valve stenosis. Tricuspid Valve: The tricuspid valve is normal in structure. Tricuspid valve regurgitation is not demonstrated. No evidence of tricuspid stenosis. Aortic Valve: The aortic valve is tricuspid. Aortic valve regurgitation is not visualized. Mild aortic valve sclerosis is present, with no evidence of aortic valve stenosis. Pulmonic Valve: The pulmonic valve was normal in structure. Pulmonic valve regurgitation is not visualized. No evidence of  pulmonic stenosis. Aorta: The aortic root is normal in size and structure. There is borderline dilatation of the aortic root, measuring 41 mm. Venous: The inferior vena cava is normal in size with greater than 50% respiratory variability, suggesting right atrial pressure of 3 mmHg. IAS/Shunts: No atrial level shunt detected by color flow Doppler.  LEFT VENTRICLE PLAX 2D LVIDd:         4.80 cm  Diastology LVIDs:         3.00 cm  LV e' medial:    6.85 cm/s LV PW:         1.20 cm  LV E/e' medial:  9.5 LV IVS:        1.10 cm  LV e' lateral:   10.70 cm/s LVOT diam:     2.40 cm  LV E/e' lateral: 6.1 LV SV:         115 LV SV Index:  50 LVOT Area:     4.52 cm  RIGHT VENTRICLE RV S prime:     11.89 cm/s TAPSE (M-mode): 1.8 cm LEFT ATRIUM             Index       RIGHT ATRIUM           Index LA diam:        4.30 cm 1.89 cm/m  RA Area:     23.20 cm LA Vol (A2C):   95.0 ml 41.71 ml/m RA Volume:   65.80 ml  28.89 ml/m LA Vol (A4C):   82.6 ml 36.27 ml/m LA Biplane Vol: 96.1 ml 42.19 ml/m  AORTIC VALVE LVOT Vmax:   118.00 cm/s LVOT Vmean:  75.900 cm/s LVOT VTI:    0.254 m  AORTA Ao Root diam: 4.10 cm MITRAL VALVE MV Area (PHT): 2.11 cm    SHUNTS MV Decel Time: 359 msec    Systemic VTI:  0.25 m MV E velocity: 65.00 cm/s  Systemic Diam: 2.40 cm MV A velocity: 92.60 cm/s MV E/A ratio:  0.70 Candee Furbish MD Electronically signed by Candee Furbish MD Signature Date/Time: 09/08/2020/12:03:50 PM    Final    CT HEAD CODE STROKE WO CONTRAST  Result Date: 09/07/2020 CLINICAL DATA:  Code stroke.  Left-sided weakness. EXAM: CT HEAD WITHOUT CONTRAST TECHNIQUE: Contiguous axial images were obtained from the base of the skull through the vertex without intravenous contrast. COMPARISON:  07/08/2020 FINDINGS: Brain: Age related atrophy. Chronic small-vessel ischemic changes of the white matter. No sign of acute infarction, mass lesion, hemorrhage, hydrocephalus or extra-axial collection. Vascular: There is atherosclerotic calcification of the  major vessels at the base of the brain. Skull: Negative Sinuses/Orbits: Clear/normal Other: None ASPECTS ([place] Stroke Program Early CT Score) - Ganglionic level infarction (caudate, lentiform nuclei, internal capsule, insula, M1-M3 cortex): 7 - Supraganglionic infarction (M4-M6 cortex): 3 Total score (0-10 with 10 being normal): 10 IMPRESSION: 1. No acute finding. Atrophy and chronic small-vessel ischemic change. 2. ASPECTS is 10. 3. These results were communicated to Dr. Cheral Marker at 5:25 pmon 5/2/2022by text page via the [facility] messaging system. Electronically Signed   By: Nelson Chimes M.D.   On: 09/07/2020 17:26   VAS US CAROTID  Result Date: 09/08/2020 Carotid Arterial Duplex Study Patient Name:  LOVIS MORE  Date of Exam:   09/08/2020 Medical Rec #: [id number]         Accession #:    [id number] Date of Birth: [birth year]         Patient Gender: M Patient Age:   80Y Exam Location:  [facility] Procedure:      VAS US CAROTID Referring Phys: [id number] Roney Mans [facility] --------------------------------------------------------------------------------  Indications:       LLE weakness. Risk Factors:      Hypertension, hyperlipidemia, no history of smoking. Other Factors:     Afib, CHF, HX of cardiac arrest. Limitations        Today's exam was limited due to the patient's respiratory                    variation, the body habitus of the patient and patient                    movement. Comparison Study:  No previous studies Performing Technologist: Jody Hill RVT, RDMS  Examination Guidelines: A complete evaluation includes B-mode imaging, spectral Doppler, color Doppler, and power Doppler as needed of all accessible portions  of each vessel. Bilateral testing is considered an integral part of a complete examination. Limited examinations for reoccurring indications may be performed as noted.  Right Carotid Findings: +----------+--------+--------+--------+------------------+-------------------+           PSV  cm/sEDV cm/sStenosisPlaque DescriptionComments            +----------+--------+--------+--------+------------------+-------------------+ CCA Prox  62      9                                                     +----------+--------+--------+--------+------------------+-------------------+ CCA Distal50      10                                                    +----------+--------+--------+--------+------------------+-------------------+ ICA Prox  54      11                                                    +----------+--------+--------+--------+------------------+-------------------+ ICA Distal58      15                                Not well visualized +----------+--------+--------+--------+------------------+-------------------+ ECA       65      0                                                     +----------+--------+--------+--------+------------------+-------------------+ +----------+--------+-------+----------------+-------------------+           PSV cm/sEDV cmsDescribe        Arm Pressure (mmHG) +----------+--------+-------+----------------+-------------------+ Subclavian140            Multiphasic, WNL                    +----------+--------+-------+----------------+-------------------+ +---------+--------+--+--------+-+---------+ VertebralPSV cm/s29EDV cm/s6Antegrade +---------+--------+--+--------+-+---------+  Left Carotid Findings: +----------+--------+--------+--------+--------------------+-------------------+           PSV cm/sEDV cm/sStenosisPlaque Description  Comments            +----------+--------+--------+--------+--------------------+-------------------+ CCA Prox  76      0                                   intimal thickening  +----------+--------+--------+--------+--------------------+-------------------+ CCA Distal58      0               heterogenous and    intimal thickening                                     smooth                                  +----------+--------+--------+--------+--------------------+-------------------+ ICA Prox  45  11      1-39%   calcific,                                                                 heterogenous and                                                          smooth                                  +----------+--------+--------+--------+--------------------+-------------------+ ICA Distal62      14                                  Not well visualized +----------+--------+--------+--------+--------------------+-------------------+ ECA       68      0                                                       +----------+--------+--------+--------+--------------------+-------------------+ +----------+--------+--------+----------------+-------------------+           PSV cm/sEDV cm/sDescribe        Arm Pressure (mmHG) +----------+--------+--------+----------------+-------------------+ [id number]             Multiphasic, WNL                    +----------+--------+--------+----------------+-------------------+ +---------+--------+--------+--------------+ VertebralPSV cm/sEDV cm/sNot identified +---------+--------+--------+--------------+   Summary: Right Carotid: The extracranial vessels were near-normal with only minimal wall                thickening or plaque. Left Carotid: Velocities in the left ICA are consistent with a 1-39% stenosis.               Non-hemodynamically significant plaque <50% noted in the CCA. The               ECA appears <50% stenosed. Vertebrals:  Right vertebral artery demonstrates antegrade flow. Left vertebral              artery was not visualized. Subclavians: Normal flow hemodynamics were seen in bilateral subclavian              arteries. *See table(s) above for measurements and observations.     Preliminary     PHYSICAL EXAM  Temp:  [97.5 F (36.4 C)-98.4 F (36.9 C)] 98.4 F (36.9 C)  (05/03 1920) Pulse Rate:  [73-90] 79 (05/03 1920) Resp:  [17-27] 19 (05/03 1920) BP: (100-167)/(49-84) 151/75 (05/03 1920) SpO2:  [94 %-100 %] 100 % (05/03 1920) Weight:  [114.6 kg] 114.6 kg (05/03 0830)  General - Well nourished, well developed, in no apparent distress.  Ophthalmologic - fundi not visualized due to noncooperation.  Cardiovascular - irregularly irregular heart rate and rhythm.  Neuro - awake, alert, eyes open, orientated to age, place, time and people. No aphasia, fluent  language, following all simple commands. Able to name and repeat. No gaze palsy, tracking bilaterally, visual field full, PERRL. No facial droop. Tongue midline. Bilateral UEs 4/5, no drift. Bilaterally LEs 3/5 proximally and 4/5 distally.  Bilateral lower extremity pedal edema.  Sensation symmetrical bilaterally, b/l FTN intact, gait not tested.  .   ASSESSMENT/PLAN Mr. JAMORI BIGGAR is a 80 y.o. male with history of A. fib on Eliquis, hypertension, hyperlipidemia, OSA, history of GI bleeding, history of a cardiac arrest admitted for transient left lower extremity weakness. No tPA given due to symptom resolved.    R brain TIA vs. lumbar sacral radiculopathy   CT head no acute abnormality  MRI no acute infarct  MRA unremarkable  Carotid Doppler unremarkable  2D Echo EF 60 to 65%  LDL 46  HgbA1c 4.4  Eliquis for VTE prophylaxis  Eliquis (apixaban) daily prior to admission, now on Eliquis (apixaban) daily.  Continue Eliquis on discharge  Patient counseled to be compliant with his antithrombotic medications  Ongoing aggressive stroke risk factor management  Therapy recommendations: Pending  Disposition: Pending  Persistent A. Fib  On amiodarone  On Eliquis  Cardiology on board  Rate controlled  Hypertension History of hypotension . Stable . Home midodrine on hold due to hypotension . Cardiology on board  Long term BP goal normotensive  Hyperlipidemia  Home meds:  Lipitor 20  LDL 46, goal < 70  Now on April 20  Continue statin at discharge  Other Stroke Risk Factors  Advanced age  Obesity, Body mass index is 34.27 kg/m.   Obstructive sleep apnea, on CPAP at home  He is for cardiac arrest  Other Active Problems  History of GI bleeding  AKI on CKD 3a, finding 2.9, baseline 1.78  Hospital day # 0  Neurology will sign off. Please call with questions. Pt will follow up with stroke clinic NP at [facility] in about 4 weeks. Thanks for the consult.   Rosalin Hawking, MD PhD Stroke Neurology 09/08/2020 7:29 PM    To contact Stroke Continuity provider, please refer to [email or web address] After hours, contact General Neurology

## 2020-09-08 NOTE — Consult Note (Addendum)
WOC Nurse Consult Note: Reason for Consult: Consult requested for left heel and abd folds and groin.  Wound type: Pt has red moist rash and partial thickness skin loss in patchy areas to groin and lower abd folds.  Appearance is consistent with moisture associated skin damage and probable candidiasis.  Pt states he has been followed by a dermatologist prior to admission for dry cracked left heel; he has raised callous skin that dry and cracked; affected area is approx 3X2X.2cm, red and dry, no drainage or fluctuance. He has been using some type of prescription cream; he is not sure of the name of this product. He does not want to order any more while he is in the hospital, since he just received a refill.  We will use moisture cream while inpatient and he can resume the previous plan of care after discharge. Left plantar great toe with partial thickness wound; 1X.3X.1cm, dry and red without drainage or fluctuance. Foam dressing to protect from further injury and promote healing. Dressing procedure/placement/frequency: Topical treatment orders provided for bedside nurses to perform as follows: Apply barrier cream and antifungal powder to abd folds and groin PRN with each turning and cleaning session. Please re-consult if further assistance is needed.  Thank-you,  Julien Girt MSN, [place], [place], [place], [place]

## 2020-09-09 DIAGNOSIS — N179 Acute kidney failure, unspecified: Secondary | ICD-10-CM | POA: Diagnosis not present

## 2020-09-09 LAB — CBC WITH DIFFERENTIAL/PLATELET
Abs Immature Granulocytes: 0.03 10*3/uL (ref 0.00–0.07)
Basophils Absolute: 0 10*3/uL (ref 0.0–0.1)
Basophils Relative: 0 %
Eosinophils Absolute: 0 10*3/uL (ref 0.0–0.5)
Eosinophils Relative: 0 %
HCT: 22.9 % — ABNORMAL LOW (ref 39.0–52.0)
Hemoglobin: 7.6 g/dL — ABNORMAL LOW (ref 13.0–17.0)
Immature Granulocytes: 1 %
Lymphocytes Relative: 6 %
Lymphs Abs: 0.4 10*3/uL — ABNORMAL LOW (ref 0.7–4.0)
MCH: 33 pg (ref 26.0–34.0)
MCHC: 33.2 g/dL (ref 30.0–36.0)
MCV: 99.6 fL (ref 80.0–100.0)
Monocytes Absolute: 0.3 10*3/uL (ref 0.1–1.0)
Monocytes Relative: 6 %
Neutro Abs: 5.1 10*3/uL (ref 1.7–7.7)
Neutrophils Relative %: 87 %
Platelets: 219 10*3/uL (ref 150–400)
RBC: 2.3 MIL/uL — ABNORMAL LOW (ref 4.22–5.81)
RDW: 14.8 % (ref 11.5–15.5)
WBC: 5.9 10*3/uL (ref 4.0–10.5)
nRBC: 0 % (ref 0.0–0.2)

## 2020-09-09 LAB — BASIC METABOLIC PANEL
Anion gap: 9 (ref 5–15)
BUN: 35 mg/dL — ABNORMAL HIGH (ref 8–23)
CO2: 21 mmol/L — ABNORMAL LOW (ref 22–32)
Calcium: 7.9 mg/dL — ABNORMAL LOW (ref 8.9–10.3)
Chloride: 104 mmol/L (ref 98–111)
Creatinine, Ser: 3.36 mg/dL — ABNORMAL HIGH (ref 0.61–1.24)
GFR, Estimated: 18 mL/min — ABNORMAL LOW (ref >60)
Glucose, Bld: 156 mg/dL — ABNORMAL HIGH (ref 70–99)
Potassium: 3.5 mmol/L (ref 3.5–5.1)
Sodium: 134 mmol/L — ABNORMAL LOW (ref 135–145)

## 2020-09-09 LAB — HIV ANTIBODY (ROUTINE TESTING W REFLEX): HIV Screen 4th Generation wRfx: NONREACTIVE

## 2020-09-09 LAB — APTT: aPTT: 72 seconds — ABNORMAL HIGH (ref 24–36)

## 2020-09-09 LAB — HEPARIN LEVEL (UNFRACTIONATED): Heparin Unfractionated: >1.1 IU/mL — ABNORMAL HIGH (ref 0.30–0.70)

## 2020-09-09 LAB — PROTEIN / CREATININE RATIO, URINE
Creatinine, Urine: 24.75 mg/dL
Protein Creatinine Ratio: 5.37 mg/mg{Cre} — ABNORMAL HIGH (ref 0.00–0.15)
Total Protein, Urine: 133 mg/dL

## 2020-09-09 LAB — PHOSPHORUS: Phosphorus: 4.7 mg/dL — ABNORMAL HIGH (ref 2.5–4.6)

## 2020-09-09 LAB — HEPATITIS B SURFACE ANTIGEN: Hepatitis B Surface Ag: NONREACTIVE

## 2020-09-09 LAB — MAGNESIUM: Magnesium: 1.8 mg/dL (ref 1.7–2.4)

## 2020-09-09 LAB — URINE CULTURE: Culture: NO GROWTH

## 2020-09-09 NOTE — Consult Note (Signed)
[name] Admit Date: 09/07/2020 09/09/2020 Darryl Diaz   Requesting Physician:  Terrilee Croak, MD  Reason for Consult: Acute kidney injury on chronic kidney disease  HPI:  Patient is a 80 year old male with history of CKD (appears to be stage 1 as of Feb 2022) who presented with LLE weakness and shortness of breath.   Past medical history significant for paroxysmal atrial fibrillation on Eliquis, tacky-bradycardia syndrome, chronic chronic diastolic heart failure, hyperlipidemia, IDA, with depression and anxiety. On admission, patient noted to have creatinine elevated at 2.82 with baseline of 1.4 as of early Mar 2022. Patient admitted with likely heart failure exacerbation and started on Lasix 80 IV for one time dose. Patient reports that left lower extremity weakness occurred while he was driving and once he was able to get to a parking lot safely, called his sister for assistance to getting to the hospital. Patient was evaluated for stroke concerns with negative head CT and MRI with MRA.   Patient denies any symptoms of dysuria or difficulty initiating urination.  He reports that he has had hematuria for an unknown amount of time.  He denies any abdominal pain.  He does report some pain with urination.  Patient denies taking NSAIDs and reports only taking tylenol as OTC pain medication.  He states that he was prescribed an antibiotic for his feet by his podiatrist but is unsure of the name.  He reports taking his antibiotic for 2 weeks. Chart review shows that he was prescribed keflex for 14 day course.    PMH Incudes:  Diastolic heart failure  Chronic kidney disease, stage 2  GERD  Atrial fibrillation (Eliquis, amiodarone)  Hypertension  Lower extremity edema  Tachybradycardia syndrome  Iron deficiency anemia  Alcoholic cirrhosis of liver    Creatinine, Ser (mg/dL)  Date Value  09/09/2020 3.36 (H)  09/08/2020 2.90 (H)  09/07/2020 2.82 (H)  09/03/2020  1.78 (H)  07/08/2020 1.40 (H)  06/16/2020 0.92  06/03/2020 1.04  04/10/2020 1.09  03/20/2020 1.06  03/19/2020 1.32 (H)   I/Os:  Intake/Output Summary (Last 24 hours) at 09/09/2020 1420 Last data filed at 09/08/2020 1746 Gross per 24 hour  Intake 140 ml  Output 450 ml  Net -310 ml    ROS NSAIDS: Patient denies IV Contrast: No recent IV contrast administered per chart review TMP/SMX: none prescribed per chart review  Hypotension : Patient reports no episodes of hypotension or dizziness while at home Balance of 12 systems is negative w/ exceptions as above  PMH  Past Medical History:  Diagnosis Date  . 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 Anemia   . 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 Bradycardia    low 20's  . Bradycardic cardiac arrest ([place])   . Cardiac arrest ([place]) 04/20/2019   12 min CPR with epi  . Chronic diastolic CHF (congestive heart failure) ([place])   . Fall at home 03/28/2016  . Fatty liver   . Gastritis   . GERD (gastroesophageal reflux disease)   . H/O seasonal allergies   . History of GI bleed   . Hx of adenomatous colonic polyps 08/10/2017  . Hyperlipidemia   . Hypertension   . Hypotension   . Insomnia   . 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 Mallory-Weiss tear   . OA (osteoarthritis)    hands  . OSA (obstructive sleep apnea)    No longer uses CPAP  . Persistent atrial  fibrillation with rapid ventricular response ([place]) 02/05/2020  . Recurrent syncope   . 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([place])    [place]  Past Surgical History:  Procedure Laterality Date  . ANKLE SURGERY Left 2014   had rods put in   . BIOPSY  02/28/2019   Procedure: BIOPSY;  Surgeon: Milus Banister, MD;  Location: [facility] ENDOSCOPY;  Service: Endoscopy;;  . CARDIOVERSION  02/06/2020  . CARDIOVERSION N/A 02/06/2020   Procedure: CARDIOVERSION;  Surgeon: Werner Lean, MD;  Location: [facility] ENDOSCOPY;  Service: Cardiovascular;  Laterality: N/A;  .  CARDIOVERSION N/A 02/24/2020   Procedure: CARDIOVERSION;  Surgeon: Werner Lean, MD;  Location: [facility] ENDOSCOPY;  Service: Cardiovascular;  Laterality: N/A;  . [place]  . ESOPHAGOGASTRODUODENOSCOPY (EGD) WITH PROPOFOL N/A 02/28/2019   Procedure: ESOPHAGOGASTRODUODENOSCOPY (EGD) WITH PROPOFOL;  Surgeon: Milus Banister, MD;  Location: [facility] ENDOSCOPY;  Service: Endoscopy;  Laterality: N/A;  . HIP ARTHROPLASTY Left 04/01/2016   Procedure: ARTHROPLASTY BIPOLAR HIP (HEMIARTHROPLASTY);  Surgeon: Paralee Cancel, MD;  Location: [place];  Service: Orthopedics;  Laterality: Left;  . HIP CLOSED REDUCTION Left 04/30/2016   Procedure: CLOSED REDUCTION HIP;  Surgeon: Wylene Simmer, MD;  Location: [place];  Service: Orthopedics;  Laterality: Left;  . TONSILLECTOMY    . TOTAL HIP ARTHROPLASTY Right 10/15/2019   Procedure: TOTAL HIP ARTHROPLASTY ANTERIOR APPROACH;  Surgeon: Paralee Cancel, MD;  Location: [facility] ORS;  Service: Orthopedics;  Laterality: Right;  70 mins   FH  Family History  Problem Relation Age of Onset  . Heart disease Mother 81  . Hypertension Mother   . Arthritis Mother   . Heart failure Father 55  . Stroke Maternal Aunt   . Heart failure Sister   . Colon cancer Neg Hx   . Esophageal cancer Neg Hx   . Stomach cancer Neg Hx   . Rectal cancer Neg Hx    SH  reports that he has never smoked. He has never used smokeless tobacco. He reports previous alcohol use. He reports that he does not use drugs. Allergies  Allergies  Allergen Reactions  . Penicillins Other (See Comments)    Tolerated Cephalosporin Date: 10/15/19. Allergy is from childhood Did it involve swelling of the face/tongue/throat, SOB, or low BP? Unknown Did it involve sudden or severe rash/hives, skin peeling, or any reaction on the inside of your mouth or nose? Unknown Did you need to seek medical attention at a hospital or doctor's office? Unknown When did it last happen?childhood reaction If all  above answers are "NO", may proceed with cephalosporin use.  Marland Kitchen Penicillins    Home medications Prior to Admission medications   Medication Sig Start Date End Date Taking? Authorizing Provider  acetaminophen (TYLENOL) 325 MG tablet Take 2 tablets (650 mg total) by mouth every 4 (four) hours as needed for headache or mild pain. 03/10/20  Yes Angiulli, Lavon Paganini, PA-C  amiodarone (PACERONE) 200 MG tablet Take 1 tablet (200 mg total) by mouth daily. 04/10/20  Yes Dunn, Dayna N, PA-C  atorvastatin (LIPITOR) 20 MG tablet Take 1 tablet (20 mg total) by mouth daily. 03/10/20  Yes Angiulli, [name] J, PA-C  ELIQUIS 5 MG TABS tablet TAKE 1 TABLET BY MOUTH TWICE DAILY. NEW CUSTOMER FOR [facility]. REQUESTING NEW PRESCRIPTION. Patient taking differently: Take 5 mg by mouth 2 (two) times daily. 08/12/20  Yes Sherren Mocha, MD  escitalopram (LEXAPRO) 10 MG tablet Take 1 tablet (10 mg total) by mouth daily. 03/10/20  Yes  AngiulliLavon Paganini, PA-C  ferrous sulfate 325 (65 FE) MG tablet Take 1 tablet (325 mg total) by mouth 2 (two) times daily with a meal. 03/10/20 04/10/20 Yes Angiulli, Lavon Paganini, PA-C  folic acid (FOLVITE) 1 MG tablet Take 1 tablet (1 mg total) by mouth daily. 03/10/20  Yes Angiulli, Lavon Paganini, PA-C  furosemide (LASIX) 40 MG tablet Take 1 tablet (40 mg total) by mouth daily. 08/03/20 07/29/21 Yes Sherren Mocha, MD  loratadine (CLARITIN) 10 MG tablet Take 1 tablet (10 mg total) by mouth daily. 02/12/20  Yes Medina-Vargas, Monina C, NP  Magnesium Oxide 400 MG CAPS Take 1 capsule (400 mg total) by mouth 2 (two) times daily. 05/04/20  Yes Dunn, Dayna N, PA-C  midodrine (PROAMATINE) 2.5 MG tablet Take 1 tablet (2.5 mg total) by mouth 3 (three) times daily with meals. 04/10/20  Yes Dunn, Dayna N, PA-C  Multiple Vitamins-Minerals (CENTRUM SILVER 50+MEN) TABS Take 1 tablet by mouth daily with breakfast.   Yes [provider]  nitroGLYCERIN (NITROSTAT) 0.4 MG SL tablet Place 1 tablet (0.4 mg total) under  the tongue every 5 (five) minutes x 3 doses as needed for chest pain. 07/28/20  Yes Baldwin Jamaica, PA-C  pantoprazole (PROTONIX) 40 MG tablet Take 1 tablet (40 mg total) by mouth daily. 03/10/20  Yes Angiulli, Lavon Paganini, PA-C  potassium chloride SA (KLOR-CON) 20 MEQ tablet Take 2 tablets (40 mEq total) by mouth daily. Patient taking differently: Take 20 mEq by mouth 2 (two) times daily. 08/03/20 07/29/21 Yes Sherren Mocha, MD  tamsulosin (FLOMAX) 0.4 MG CAPS capsule Take 1 capsule (0.4 mg total) by mouth every evening. 03/10/20  Yes Angiulli, Lavon Paganini, PA-C  traZODone (DESYREL) 50 MG tablet Take 1 tablet (50 mg total) by mouth at bedtime. 03/10/20  Yes Angiulli, Lavon Paganini, PA-C  triamcinolone ointment (KENALOG) 0.1 % Apply 1 application topically 2 (two) times daily. legs 08/20/20  Yes [provider]  ketoconazole (NIZORAL) 2 % cream Apply 1 application topically daily. To dry scaly skin on feet Patient not taking: No sig reported 07/23/20   Criselda Peaches, DPM  silver sulfADIAZINE (SILVADENE) 1 % cream Apply 1 application topically daily. Patient not taking: No sig reported 07/23/20   Criselda Peaches, DPM    Current Medications Scheduled Meds: . amiodarone  200 mg Oral Daily  . atorvastatin  20 mg Oral Daily  . escitalopram  10 mg Oral Daily  . ferrous sulfate  325 mg Oral BID WC  . folic acid  1 mg Oral Daily  . pantoprazole  40 mg Oral Daily  . tamsulosin  0.4 mg Oral QPM   CBC Recent Labs  Lab 09/07/20 1656 09/08/20 0510 09/09/20 1001  WBC 7.4 8.0 5.9  NEUTROABS 5.9  --  5.1  HGB 8.0* 8.5* 7.6*  HCT 23.7* 25.4* 22.9*  MCV 98.3 99.2 99.6  PLT 230 239 683   Basic Metabolic Panel Recent Labs  Lab 09/03/20 1612 09/07/20 1656 09/08/20 0510 09/09/20 1001  NA 133* 127* 132* 134*  K 4.6 4.6 4.1 3.5  CL 98 98 101 104  CO2 19* 19* 19* 21*  GLUCOSE 85 91 91 156*  BUN 27 28* 29* 35*  CREATININE 1.78* 2.82* 2.90* 3.36*  CALCIUM 8.1* 8.1* 8.0* 7.9*  PHOS  --   --   --   4.7*    Physical Exam  Blood pressure 133/70, pulse 77, temperature 98.4 F (36.9 C), temperature source Oral, resp. rate 20, height  6' (1.829 m), weight 114.6 kg, SpO2 100 %.  PHYSICAL EXAM  GEN: Pale elderly male, pleasantly conversant, sitting upright in bed in no acute distress ENT: Moist mucous membranes EYES: No scleral icterus, extraocular muscles intact bilaterally CV: Regular rate and rhythm, no murmurs appreciated PULM: Clear to auscultation bilaterally, no crackles, no wheezing, stable on room air with normal work of breathing ABD: Soft and nontender to palpation with bowel sounds present throughout all quadrants SKIN: Bilateral lower extremity with dry skin EXT: 1+ pitting edema bilateral lower extremities, significant pedal edema bilaterally   Assessment TOSHIYUKI FREDELL is a 80 y.o. 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  1. 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 Volume Overload, improved  3. HTN   Plan 1. AKI on CKD,  Now stage 3B with hematuria:  1. Renal ultrasound on 09/07/2020: Small simple left renal cyst 2. Urine analysis notable for hematuria with greater than 50 red blood cells and 100 protein urea 3. Urine culture with no growth at 24 hours 4. Concern for rapid renal decline due to glomerulonephritic changes 5. ANCA  6. AntiDS DNA  7. Hepatitis B surface antigen 8. HIV 9. Kappa/lambda light chains 10. Protein electrophoresis 11. Protein creatinine ratio 12. Complement 3 and 4 levels 13. Antistreptolysin O titers 14. Glomerular basement membrane antibodies  3. HTN: intermittently hypertensive  4.  We will also coordinate with interventional radiology for renal biopsy 5.  We will recommend urology consult if results above negative  Nephrology will follow along  [facility] Diaz  [phone] 09/09/2020, 2:02 PM

## 2020-09-09 NOTE — Progress Notes (Signed)
SLP Cancellation Note  Patient Details Name: Darryl Diaz MRN: [id number] DOB: 1940-07-23   Cancelled treatment:       Reason Eval/Treat Not Completed: SLP screened, no needs identified, will sign off   [name] 09/09/2020, 11:46 AM
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
## 2020-09-09 NOTE — Progress Notes (Addendum)
PROGRESS NOTE  Darryl Diaz  DOB: 1941/04/04  PCP: [name], MD UKG:[id number]  DOA: 09/07/2020  LOS: 1 day   Chief Complaint  Patient presents with  . Weakness  . Shortness of Breath  . Atrial Fibrillation   Brief narrative: Darryl Diaz is a 80 y.o. male with PMH significant for paroxysmal atrial fibrillation on Eliquis, tachy-brady syndrome, chronic diastolic CHF, orthostatic hypotension, hyperlipidemia, iron deficiency anemia, chronic bilateral lower extremity edema, and depression/anxiety. Patient presented to the ED on 5/2 from home for an acute left lower extremity weakness which he noticed while driving his car. While in the ED, he felt gradual improvement in the weakness but not back to baseline At baseline, walks with a Rollator, planning to move to an ALF in next 1 to 2 months.  He was recently started on Lasix by his cardiologist because of worsening pedal edema.  In the ED, patient was afebrile, hemodynamically stable Labs with sodium level low at 127, creatinine elevated at 2.82 compared to his baseline of 1.78, hemoglobin low at 8 CT abnormal Neurology was consulted from the ED MRI brain without contrast was negative for acute finding.  MRA head was negative for large vessel occlusion or significant proximal stenosis. Admitted to hospitalist service for evaluation of TIA, AKI.  Subjective: Patient was seen and examined this morning.  Feels better than yesterday She is unable to breathe better today.  Was given IV Lasix 80 mg 1 dose yesterday by cardiology. Labs this morning with creatinine level worsening, 3.36 today. Continues to have mild hematuria.  Assessment/Plan: Transient weakness of left lower extremity -Was worked up as a TIA -MRI brain without contrast was negative for acute finding. MRA head was negative for large vessel occlusion or significant proximal stenosis. -Echocardiogram with EF 60 to 65%, no cardiac source of  embolism -Carotid duplex did not show any significant stenosis. -A1c 4.4, HDL 55, LDL 46 -Continue Eliquis per neurology recommendation -Monitor on telemetry, continue neurochecks -PT/OT/SLP eval obtained.  Home health PT recommended  Acute exacerbation of chronic diastolic CHF Chronic bilateral lower extremity edema - patient complained of progressively worsening dyspnea on exertion for the last few weeks.  He was seen by cardiologist Dr. Burt Knack on 3/28 and was placed on scheduled Lasix.  -In this presentation, patient is hypervolemic with chronic bilateral lower extremity edema but also has AKI at the same time. -5/3, echocardiogram with EF 60 to 65%, mild LVH, grade 1 diastolic dysfunction  -Cardiology consultation was obtained.  Given IV Lasix 80 mg 1 time last night.  Patient clinically feels better but unable to repeat Lasix because of worsening AKI.    History of orthostatic hypotension -Was on midodrine 2.5 mg 3 times daily at home. -Currently midodrine is on hold because of elevated blood pressure -Continue to monitor blood pressure.  Acute kidney injury: -Baseline creatinine seems normal in February 2022 but seems to have progressively worsening renal function over the last 2 months. -Presented with a creatinine elevated to 1.78 which has been steadily rising up, 3.36 today. -Renal ultrasound normal.  Nephrology consultation called to Dr. Joelyn Oms Recent Labs    03/19/20 0024 03/20/20 0019 04/10/20 7628 06/03/20 1217 06/16/20 3151 07/08/20 0047 09/03/20 1612 09/07/20 1656 09/08/20 0510 09/09/20 1001  BUN 17 15 13 12 16 16 27  28* 29* 35*  CREATININE 1.32* 1.06 1.09 1.04 0.92 1.40* 1.78* 2.82* 2.90* 3.36*   Acute hyponatremia -Sodium level was low at 127 at presentation.  Improving now, 134  this morning. Recent Labs  Lab 09/03/20 1612 09/07/20 1656 09/08/20 0510 09/09/20 1001  NA 133* 127* 132* 134*   Hematuria Acute blood loss anemia -Hemoglobin between 10-11  at baseline.  Presented with hemoglobin low at 8.   -Currently having light hematuria.  Hemoglobin down to 7.6 today. -Continue to monitor. -urology consultation called to Dr. Diona Fanti.  I also ordered for a plain CT abdomen and pelvis. Recent Labs    06/03/20 1217 07/08/20 0047 09/07/20 1656 09/08/20 0510 09/09/20 1001  HGB 10.2* 10.2* 8.0* 8.5* 7.6*  MCV 93  --  98.3 99.2 99.6   Paroxysmal atrial fibrillation 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with history of bradycardic arrest: -Currently in sinus rhythm with frequent PVCs on admission.  Continue amiodarone.  -Because of hematuria, Eliquis was held and heparin drip was started by cardiology last night.  With worsening hematuria, heparin drip was stopped as well this morning.  Currently not on anticoagulation.  -continue to monitor in telemetry  Hyperlipidemia Continue atorvastatin  Depression/anxiety Continue Lexapro.  GERD -Continue Protonix.  BPH Continue Flomax.  Mobility: Encourage ambulation.  PT eval obtained.  Home health PT recommended Code Status:   Code Status: Full Code  Nutritional status: Body mass index is 34.27 kg/m.     Diet Order            Diet renal with fluid restriction Fluid restriction: 1200 mL Fluid; Room service appropriate? Yes; Fluid consistency: Thin  Diet effective now                 DVT prophylaxis:    Antimicrobials:  None Fluid: None Consultants: Cardiology, nephrology, urology Family Communication:  None  Status is: Inpatient  Remains inpatient appropriate because: Needs further inpatient evaluation and management  Dispo: The patient is from: Home              Anticipated d/c is to: Home with home health PT              Patient currently is not medically stable to d/c.   Difficult to place patient No     Infusions:    Scheduled Meds: . amiodarone  200 mg Oral Daily  . atorvastatin  20 mg Oral Daily  . escitalopram  10 mg Oral Daily  . ferrous sulfate  325 mg Oral BID  WC  . folic acid  1 mg Oral Daily  . pantoprazole  40 mg Oral Daily  . tamsulosin  0.4 mg Oral QPM    Antimicrobials: Anti-infectives (From admission, onward)   None      PRN meds: acetaminophen **OR** acetaminophen (TYLENOL) oral liquid 160 mg/5 mL **OR** acetaminophen, senna-docusate   Objective: Vitals:   09/09/20 0856 09/09/20 1157  BP: 138/69 133/70  Pulse: 81 77  Resp: 20 20  Temp: 98.1 F (36.7 C) 98.4 F (36.9 C)  SpO2: 99% 100%    Intake/Output Summary (Last 24 hours) at 09/09/2020 1315 Last data filed at 09/08/2020 1746 Gross per 24 hour  Intake 140 ml  Output 450 ml  Net -310 ml   Filed Weights   09/08/20 0830  Weight: 114.6 kg   Weight change:  Body mass index is 34.27 kg/m.   Physical Exam: General exam: Pleasant, elderly Caucasian male.  Lying down in bed.  Not in distress Skin: No rashes, lesions or ulcers. HEENT: Atraumatic, normocephalic, no obvious bleeding Lungs: Not on supplemental oxygen.  Clear to auscultation bilaterally CVS: Regular rate and rhythm, no murmur GI/Abd soft, nontender, nondistended,, bowel  sound present CNS: Alert, awake, oriented x3 Psychiatry: Mood appropriate Extremities: Chronic 1+ bilateral lower extremity wound with stasis changes, no calf tenderness  Data Review: I have personally reviewed the laboratory data and studies available.  Recent Labs  Lab 09/07/20 1656 09/08/20 0510 09/09/20 1001  WBC 7.4 8.0 5.9  NEUTROABS 5.9  --  5.1  HGB 8.0* 8.5* 7.6*  HCT 23.7* 25.4* 22.9*  MCV 98.3 99.2 99.6  PLT 230 239 219   Recent Labs  Lab 09/03/20 1612 09/07/20 1656 09/08/20 0510 09/09/20 1001  NA 133* 127* 132* 134*  K 4.6 4.6 4.1 3.5  CL 98 98 101 104  CO2 19* 19* 19* 21*  GLUCOSE 85 91 91 156*  BUN 27 28* 29* 35*  CREATININE 1.78* 2.82* 2.90* 3.36*  CALCIUM 8.1* 8.1* 8.0* 7.9*  MG  --   --   --  1.8  PHOS  --   --   --  4.7*    F/u labs ordered Unresulted Labs (From admission, onward)           Start     Ordered   09/09/20 0500  CBC with Differential/Platelet  Daily,   R      09/08/20 1607   09/09/20 2426  Basic metabolic panel  Daily,   R      09/08/20 1607          Signed, Terrilee Croak, MD [facility] 09/09/2020

## 2020-09-09 NOTE — Consult Note (Signed)
I have been contacted by Dr. Pietro Cassis  For the patient's microscopic hematuria.  He is undergoing evaluation for multiple issues, including possible glomerulonephritis.   As he is having no gross hematuria during the hospitalization, I do not think aggressive urologic intervention is needed at the present time.  The patient has had renal ultrasound which has revealed no hydronephrosis, mass or stone disease..  If he is found to have no renal reason for the  microscopic hematuria, follow-up on an outpatient basis in our office would be adequate.   If there are further questions regarding this man, please do not hesitate to contact me.

## 2020-09-09 NOTE — Progress Notes (Signed)
Physical Therapy Treatment Patient Details Name: Darryl Diaz MRN: [id number] DOB: 03/24/1941 Today's Date: 09/09/2020    History of Present Illness 80 y.o. male admitted with L leg weaknes. MRI (-) for stroke. Work up underway for TIA per neurology. PMH significant for atrial fibrillation on Eliquis, HTN, CHF, ETOH abuse (in remission), HLD, OSA, GIB, B THA and cardiac arrest.    PT Comments    Pt with improved overall tolerance for ambulation today, ambulating good hallway distance with use of RW and close guard for safety. Pt reporting decreased LLE pain today vs yesterday. Overall progressing well, will continue to follow.     Follow Up Recommendations  Home health PT;Supervision for mobility/OOB     Equipment Recommendations  None recommended by PT    Recommendations for Other Services       Precautions / Restrictions Precautions Precautions: Fall Restrictions Weight Bearing Restrictions: No    Mobility  Bed Mobility Overal bed mobility: Needs Assistance Bed Mobility: Supine to Sit;Sit to Supine     Supine to sit: Supervision Sit to supine: Supervision        Transfers Overall transfer level: Needs assistance Equipment used: Rolling walker (2 wheeled) Transfers: Sit to/from Stand Sit to Stand: Min guard         General transfer comment: for safety, slow to rise and requires rocking AP for momentum building  Ambulation/Gait Ambulation/Gait assistance: Min guard Gait Distance (Feet): 160 Feet Assistive device: Rolling walker (2 wheeled) Gait Pattern/deviations: Step-through pattern;Decreased stride length Gait velocity: decr   General Gait Details: min gaurd for safety, verbal cuing for placement in RW. Brief standing rest break x30 seconds, SPO2 94% and DOE 2/4   Stairs             Wheelchair Mobility    Modified Rankin (Stroke Patients Only)       Balance Overall balance assessment: Needs assistance;History of  Falls Sitting-balance support: No upper extremity supported;Feet supported Sitting balance-Leahy Scale: Good     Standing balance support: Bilateral upper extremity supported Standing balance-Leahy Scale: Poor Standing balance comment: Reliant on BUE support               High Level Balance Comments: 5X STS performed and required 37.56 seconds to perform. Indicating higher fall risk.            Cognition Arousal/Alertness: Awake/alert Behavior During Therapy: WFL for tasks assessed/performed Overall Cognitive Status: No family/caregiver present to determine baseline cognitive functioning                                 General Comments: enjoys telling stories, can be tangential      Exercises      General Comments        Pertinent Vitals/Pain Pain Assessment: Faces Faces Pain Scale: Hurts a little bit Pain Location: L thigh Pain Descriptors / Indicators: Sore;Discomfort Pain Intervention(s): Limited activity within patient's tolerance;Monitored during session;Repositioned    Home Living                      Prior Function            PT Goals (current goals can now be found in the care plan section) Acute Rehab PT Goals Patient Stated Goal: to return home with assistance of church friends PT Goal Formulation: With patient Time For Goal Achievement: 09/22/20 Potential to Achieve Goals: Fair Progress towards PT  goals: Progressing toward goals    Frequency    Min 3X/week      PT Plan Current plan remains appropriate    Co-evaluation              AM-PAC PT "6 Clicks" Mobility   Outcome Measure  Help needed turning from your back to your side while in a flat bed without using bedrails?: None Help needed moving from lying on your back to sitting on the side of a flat bed without using bedrails?: None Help needed moving to and from a bed to a chair (including a wheelchair)?: A Little Help needed standing up from a chair  using your arms (e.g., wheelchair or bedside chair)?: A Little Help needed to walk in hospital room?: A Little Help needed climbing 3-5 steps with a railing? : A Lot 6 Click Score: 19    End of Session Equipment Utilized During Treatment: Gait belt Activity Tolerance: Patient tolerated treatment well Patient left: with call bell/phone within reach;in bed;with bed alarm set;Other (comment) (lab in room drawing blood at end of session) Nurse Communication: Mobility status PT Visit Diagnosis: Unsteadiness on feet (R26.81);Muscle weakness (generalized) (M62.81);History of falling (Z91.81)     Time: 1530-1610 (20 minutes not billable, nephrology at bedside) PT Time Calculation (min) (ACUTE ONLY): 40 min  Charges:  $Gait Training: 8-22 mins                    Stacie Glaze, PT DPT Acute Rehabilitation Services Pager [phone]  Office [phone]    [place] 09/09/2020, 5:13 PM

## 2020-09-10 ENCOUNTER — Inpatient Hospital Stay (HOSPITAL_COMMUNITY): Payer: Medicare Other

## 2020-09-10 ENCOUNTER — Encounter (HOSPITAL_COMMUNITY): Payer: Self-pay | Admitting: Internal Medicine

## 2020-09-10 LAB — CBC WITH DIFFERENTIAL/PLATELET
Abs Immature Granulocytes: 0.02 10*3/uL (ref 0.00–0.07)
Basophils Absolute: 0 10*3/uL (ref 0.0–0.1)
Basophils Relative: 0 %
Eosinophils Absolute: 0 10*3/uL (ref 0.0–0.5)
Eosinophils Relative: 0 %
HCT: 23.5 % — ABNORMAL LOW (ref 39.0–52.0)
Hemoglobin: 7.9 g/dL — ABNORMAL LOW (ref 13.0–17.0)
Immature Granulocytes: 0 %
Lymphocytes Relative: 12 %
Lymphs Abs: 0.8 10*3/uL (ref 0.7–4.0)
MCH: 33.3 pg (ref 26.0–34.0)
MCHC: 33.6 g/dL (ref 30.0–36.0)
MCV: 99.2 fL (ref 80.0–100.0)
Monocytes Absolute: 0.5 10*3/uL (ref 0.1–1.0)
Monocytes Relative: 7 %
Neutro Abs: 5.5 10*3/uL (ref 1.7–7.7)
Neutrophils Relative %: 81 %
Platelets: 236 10*3/uL (ref 150–400)
RBC: 2.37 MIL/uL — ABNORMAL LOW (ref 4.22–5.81)
RDW: 14.7 % (ref 11.5–15.5)
WBC: 6.9 10*3/uL (ref 4.0–10.5)
nRBC: 0 % (ref 0.0–0.2)

## 2020-09-10 LAB — KAPPA/LAMBDA LIGHT CHAINS
Kappa free light chain: 80.8 mg/L — ABNORMAL HIGH (ref 3.3–19.4)
Kappa, lambda light chain ratio: 0.9 (ref 0.26–1.65)
Lambda free light chains: 89.3 mg/L — ABNORMAL HIGH (ref 5.7–26.3)

## 2020-09-10 LAB — BASIC METABOLIC PANEL
Anion gap: 9 (ref 5–15)
BUN: 33 mg/dL — ABNORMAL HIGH (ref 8–23)
CO2: 24 mmol/L (ref 22–32)
Calcium: 8 mg/dL — ABNORMAL LOW (ref 8.9–10.3)
Chloride: 103 mmol/L (ref 98–111)
Creatinine, Ser: 3.51 mg/dL — ABNORMAL HIGH (ref 0.61–1.24)
GFR, Estimated: 17 mL/min — ABNORMAL LOW (ref >60)
Glucose, Bld: 106 mg/dL — ABNORMAL HIGH (ref 70–99)
Potassium: 3.8 mmol/L (ref 3.5–5.1)
Sodium: 136 mmol/L (ref 135–145)

## 2020-09-10 LAB — HCV AB W REFLEX TO QUANT PCR: HCV Ab: <0.1 s/co ratio (ref 0.0–0.9)

## 2020-09-10 LAB — ANTISTREPTOLYSIN O TITER: ASO: 192 IU/mL (ref 0.0–200.0)

## 2020-09-10 LAB — C4 COMPLEMENT: Complement C4, Body Fluid: 33 mg/dL (ref 12–38)

## 2020-09-10 LAB — ANTI-DNA ANTIBODY, DOUBLE-STRANDED: ds DNA Ab: <1 IU/mL (ref 0–9)

## 2020-09-10 LAB — HCV INTERPRETATION

## 2020-09-10 LAB — GLOMERULAR BASEMENT MEMBRANE ANTIBODIES: GBM Ab: 4 units (ref 0–20)

## 2020-09-10 LAB — C3 COMPLEMENT: C3 Complement: 104 mg/dL (ref 82–167)

## 2020-09-10 LAB — ANA: Anti Nuclear Antibody (ANA): NEGATIVE

## 2020-09-10 MED ORDER — MIDAZOLAM HCL 2 MG/2ML IJ SOLN
INTRAMUSCULAR | Status: AC
  Filled 2020-09-10: qty 2

## 2020-09-10 MED ORDER — FENTANYL CITRATE (PF) 100 MCG/2ML IJ SOLN
INTRAMUSCULAR | Status: AC
  Filled 2020-09-10: qty 2

## 2020-09-10 MED ORDER — LIDOCAINE HCL (PF) 1 % IJ SOLN
INTRAMUSCULAR | Status: AC
  Filled 2020-09-10: qty 30

## 2020-09-10 MED ORDER — APIXABAN 5 MG PO TABS
5.0000 mg | ORAL_TABLET | Freq: Two times a day (BID) | ORAL | Status: DC
  Administered 2020-09-12 – 2020-09-13 (×3): 5 mg via ORAL
  Filled 2020-09-10 (×3): qty 1

## 2020-09-10 MED ORDER — GELATIN ABSORBABLE 12-7 MM EX MISC
CUTANEOUS | Status: AC
  Filled 2020-09-10: qty 1

## 2020-09-10 MED ORDER — AMLODIPINE BESYLATE 5 MG PO TABS
5.0000 mg | ORAL_TABLET | Freq: Every day | ORAL | Status: DC
  Administered 2020-09-10 – 2020-09-13 (×4): 5 mg via ORAL
  Filled 2020-09-10 (×4): qty 1

## 2020-09-10 MED ORDER — MIDAZOLAM HCL 2 MG/2ML IJ SOLN
INTRAMUSCULAR | Status: AC | PRN
  Administered 2020-09-10: 1 mg via INTRAVENOUS

## 2020-09-10 MED ORDER — FENTANYL CITRATE (PF) 100 MCG/2ML IJ SOLN
INTRAMUSCULAR | Status: AC | PRN
  Administered 2020-09-10: 25 ug via INTRAVENOUS

## 2020-09-10 NOTE — Consult Note (Signed)
Chief Complaint: Patient was seen in consultation today for progressive renal failure  Referring Physician(s): Dr. Joelyn Oms  Supervising Physician: Ruthann Cancer  Patient Status: [facility] - In-pt  History of Present Illness: Darryl Diaz is a 80 y.o. male with extensive cardiac history including bradycardia, cardiac arrest, CHF, ascending aorta dilation, GERD, alcoholism in remission who presents with progressive renal failure.  Nephrology has been consulted who requests random renal biopsy.   Patient assessed at bedside.  States he has not had Eliquis since Monday- last documented administration was 09/08/20.  He has been NPO today.  He is understanding of the procedure and is agreeable to proceed.    Past Medical History:  Diagnosis Date  . Alcoholism ([place])    in remission following wife's death  . Anemia   . 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 Bradycardia    low 20's  . Bradycardic cardiac arrest ([place])   . Cardiac arrest ([place]) 04/20/2019   12 min CPR with epi  . Chronic diastolic CHF (congestive heart failure) ([place])   . Fall at home 03/28/2016  . Fatty liver   . Gastritis   . GERD (gastroesophageal reflux disease)   . H/O seasonal allergies   . History of GI bleed   . Hx of adenomatous colonic polyps 08/10/2017  . Hyperlipidemia   . Hypertension   . Hypotension   . Insomnia   . 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 Mallory-Weiss tear   . OA (osteoarthritis)    hands  . OSA (obstructive sleep apnea)    No longer uses CPAP  . Persistent atrial fibrillation with rapid ventricular response ([place]) 02/05/2020  . Recurrent syncope   . 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[facility])     Past Surgical History:  Procedure Laterality Date  . ANKLE SURGERY Left 2014   had rods put in   . BIOPSY  02/28/2019   Procedure: BIOPSY;  Surgeon: Milus Banister, MD;  Location: [facility] ENDOSCOPY;  Service: Endoscopy;;  . CARDIOVERSION  02/06/2020  . CARDIOVERSION N/A 02/06/2020    Procedure: CARDIOVERSION;  Surgeon: Werner Lean, MD;  Location: [facility] ENDOSCOPY;  Service: Cardiovascular;  Laterality: N/A;  . CARDIOVERSION N/A 02/24/2020   Procedure: CARDIOVERSION;  Surgeon: Werner Lean, MD;  Location: [place] ENDOSCOPY;  Service: Cardiovascular;  Laterality: N/A;  . [place]  . ESOPHAGOGASTRODUODENOSCOPY (EGD) WITH PROPOFOL N/A 02/28/2019   Procedure: ESOPHAGOGASTRODUODENOSCOPY (EGD) WITH PROPOFOL;  Surgeon: Milus Banister, MD;  Location: [facility] ENDOSCOPY;  Service: Endoscopy;  Laterality: N/A;  . HIP ARTHROPLASTY Left 04/01/2016   Procedure: ARTHROPLASTY BIPOLAR HIP (HEMIARTHROPLASTY);  Surgeon: Paralee Cancel, MD;  Location: [place];  Service: Orthopedics;  Laterality: Left;  . HIP CLOSED REDUCTION Left 04/30/2016   Procedure: CLOSED REDUCTION HIP;  Surgeon: Wylene Simmer, MD;  Location: [place];  Service: Orthopedics;  Laterality: Left;  . TONSILLECTOMY    . TOTAL HIP ARTHROPLASTY Right 10/15/2019   Procedure: TOTAL HIP ARTHROPLASTY ANTERIOR APPROACH;  Surgeon: Paralee Cancel, MD;  Location: [facility] ORS;  Service: Orthopedics;  Laterality: Right;  70 mins    Allergies: Penicillins and Penicillins  Medications: Prior to Admission medications   Medication Sig Start Date End Date Taking? Authorizing Provider  acetaminophen (TYLENOL) 325 MG tablet Take 2 tablets (650 mg total) by mouth every 4 (four) hours as needed for headache or mild pain. 03/10/20  Yes Angiulli, Lavon Paganini, PA-C  amiodarone (PACERONE) 200 MG tablet Take 1 tablet (200  mg total) by mouth daily. 04/10/20  Yes Dunn, Dayna N, PA-C  atorvastatin (LIPITOR) 20 MG tablet Take 1 tablet (20 mg total) by mouth daily. 03/10/20  Yes Angiulli, [name] J, PA-C  ELIQUIS 5 MG TABS tablet TAKE 1 TABLET BY MOUTH TWICE DAILY. NEW CUSTOMER FOR [facility]. REQUESTING NEW PRESCRIPTION. Patient taking differently: Take 5 mg by mouth 2 (two) times daily. 08/12/20  Yes Sherren Mocha, MD  escitalopram (LEXAPRO)  10 MG tablet Take 1 tablet (10 mg total) by mouth daily. 03/10/20  Yes Angiulli, Lavon Paganini, PA-C  ferrous sulfate 325 (65 FE) MG tablet Take 1 tablet (325 mg total) by mouth 2 (two) times daily with a meal. 03/10/20 04/10/20 Yes Angiulli, Lavon Paganini, PA-C  folic acid (FOLVITE) 1 MG tablet Take 1 tablet (1 mg total) by mouth daily. 03/10/20  Yes Angiulli, Lavon Paganini, PA-C  furosemide (LASIX) 40 MG tablet Take 1 tablet (40 mg total) by mouth daily. 08/03/20 07/29/21 Yes Sherren Mocha, MD  loratadine (CLARITIN) 10 MG tablet Take 1 tablet (10 mg total) by mouth daily. 02/12/20  Yes Medina-Vargas, Monina C, NP  Magnesium Oxide 400 MG CAPS Take 1 capsule (400 mg total) by mouth 2 (two) times daily. 05/04/20  Yes Dunn, Dayna N, PA-C  midodrine (PROAMATINE) 2.5 MG tablet Take 1 tablet (2.5 mg total) by mouth 3 (three) times daily with meals. 04/10/20  Yes Dunn, Dayna N, PA-C  Multiple Vitamins-Minerals (CENTRUM SILVER 50+MEN) TABS Take 1 tablet by mouth daily with breakfast.   Yes [provider]  nitroGLYCERIN (NITROSTAT) 0.4 MG SL tablet Place 1 tablet (0.4 mg total) under the tongue every 5 (five) minutes x 3 doses as needed for chest pain. 07/28/20  Yes Baldwin [place], PA-C  pantoprazole (PROTONIX) 40 MG tablet Take 1 tablet (40 mg total) by mouth daily. 03/10/20  Yes Angiulli, Lavon Paganini, PA-C  potassium chloride SA (KLOR-CON) 20 MEQ tablet Take 2 tablets (40 mEq total) by mouth daily. Patient taking differently: Take 20 mEq by mouth 2 (two) times daily. 08/03/20 07/29/21 Yes Sherren Mocha, MD  tamsulosin (FLOMAX) 0.4 MG CAPS capsule Take 1 capsule (0.4 mg total) by mouth every evening. 03/10/20  Yes Angiulli, Lavon Paganini, PA-C  traZODone (DESYREL) 50 MG tablet Take 1 tablet (50 mg total) by mouth at bedtime. 03/10/20  Yes Angiulli, Lavon Paganini, PA-C  triamcinolone ointment (KENALOG) 0.1 % Apply 1 application topically 2 (two) times daily. legs 08/20/20  Yes [provider]  ketoconazole (NIZORAL) 2 % cream  Apply 1 application topically daily. To dry scaly skin on feet Patient not taking: No sig reported 07/23/20   Criselda Peaches, DPM  silver sulfADIAZINE (SILVADENE) 1 % cream Apply 1 application topically daily. Patient not taking: No sig reported 07/23/20   Criselda Peaches, DPM     Family History  Problem Relation Age of Onset  . Heart disease Mother 69  . Hypertension Mother   . Arthritis Mother   . Heart failure Father 19  . Stroke Maternal Aunt   . Heart failure Sister   . Colon cancer Neg Hx   . Esophageal cancer Neg Hx   . Stomach cancer Neg Hx   . Rectal cancer Neg Hx     Social History   Socioeconomic History  . Marital status: Widowed    Spouse name: Not on file  . Number of children: 2  . Years of education: college  . Highest education level: Not on file  Occupational History  .  Occupation: retired  Tobacco Use  . Smoking status: Never Smoker  . Smokeless tobacco: Never Used  . Tobacco comment:    quit 20 years  smoked on and off for 20 years  Vaping Use  . Vaping Use: Never used  Substance and Sexual Activity  . Alcohol use: Not Currently  . Drug use: Never  . Sexual activity: Not Currently  Other Topics Concern  . Not on file  Social History Narrative   ** Merged History Encounter **       The patient is divorced w/ 1 son 1 daughter He is a retired Regulatory affairs officer, he lived in [place] but [employer] including windows on the world in the [place] in [place] He moved to [place] to be near his sister who liv   es in [place] alcoholic member of AA 2 caffeinated beverages daily prior smoker no drugs   Social Determinants of Radio broadcast assistant Strain: Not on [employer] Insecurity: Not on file  Transportation Needs: Not on file  Physical Activity: Not on file  Stress: Not on file  Social Connections: Not on file     Review of Systems: A 12 point ROS discussed and pertinent positives are  indicated in the HPI above.  All other systems are negative.  Review of Systems  Constitutional: Negative for fatigue and fever.  Respiratory: Positive for shortness of breath (improved). Negative for cough.   Gastrointestinal: Negative for abdominal pain, nausea and vomiting.  Genitourinary: Negative for dysuria and hematuria.  Musculoskeletal: Negative for back pain.  Psychiatric/Behavioral: Negative for behavioral problems and confusion.    Vital Signs: BP (!) 171/77 (BP Location: Right Arm)   Pulse 78   Temp 97.8 F (36.6 C) (Oral)   Resp 18   Ht 6' (1.829 m)   Wt 252 lb 10.4 oz (114.6 kg)   SpO2 97%   BMI 34.27 kg/m   Physical Exam Vitals and nursing note reviewed.  Constitutional:      General: He is not in acute distress.    Appearance: He is well-developed.  Cardiovascular:     Rate and Rhythm: Normal rate and regular rhythm.  Pulmonary:     Effort: Pulmonary effort is normal.     Breath sounds: Normal breath sounds.  Neurological:     General: No focal deficit present.     Mental Status: He is alert.  Psychiatric:        Mood and Affect: Mood normal.        Behavior: Behavior normal.      MD Evaluation Airway: WNL Heart: WNL Abdomen: WNL Chest/ Lungs: WNL ASA  Classification: 3 Mallampati/Airway Score: Two   Imaging: MR ANGIO HEAD WO CONTRAST  Result Date: 09/07/2020 CLINICAL DATA:  Left lower extremity weakness beginning 3 hours ago. Negative CT evaluation. EXAM: MRI HEAD WITHOUT CONTRAST MRA HEAD WITHOUT CONTRAST TECHNIQUE: Multiplanar, multiecho pulse sequences of the brain and surrounding structures were obtained without intravenous contrast. Angiographic images of the head were obtained using MRA technique without contrast. COMPARISON:  Head CT same day FINDINGS: MRI HEAD FINDINGS Brain: Diffusion imaging does not show any acute or subacute infarction. Brainstem is normal. There is cerebellar atrophy without focal insult. Cerebral hemispheres show  generalized atrophy with moderate chronic small-vessel ischemic changes of the white matter. No cortical or large vessel territory infarction. No mass lesion, hemorrhage, hydrocephalus or extra-axial collection. Vascular: Major vessels at the base of the brain show flow.  Skull and upper cervical spine: Negative Sinuses/Orbits: Clear/normal Other: None MRA HEAD FINDINGS Both internal carotid arteries are patent through the skull base and siphon regions. Ordinary siphon atherosclerotic change. The anterior and middle cerebral vessels are patent without identifiable branch vessel occlusion or correctable proximal stenosis. No aneurysm or vascular malformation. Both vertebral arteries are patent to the basilar. The right is a tiny vessel. No basilar stenosis. Superior cerebellar and posterior cerebral vessels show normal flow. IMPRESSION: MRI head: No acute finding. Generalized atrophy. Chronic small-vessel ischemic changes of the white matter. MRA head: No sign of large vessel occlusion. No significant proximal stenosis. Electronically Signed   By: Nelson Chimes M.D.   On: 09/07/2020 19:38   MR BRAIN WO CONTRAST  Result Date: 09/07/2020 CLINICAL DATA:  Left lower extremity weakness beginning 3 hours ago. Negative CT evaluation. EXAM: MRI HEAD WITHOUT CONTRAST MRA HEAD WITHOUT CONTRAST TECHNIQUE: Multiplanar, multiecho pulse sequences of the brain and surrounding structures were obtained without intravenous contrast. Angiographic images of the head were obtained using MRA technique without contrast. COMPARISON:  Head CT same day FINDINGS: MRI HEAD FINDINGS Brain: Diffusion imaging does not show any acute or subacute infarction. Brainstem is normal. There is cerebellar atrophy without focal insult. Cerebral hemispheres show generalized atrophy with moderate chronic small-vessel ischemic changes of the white matter. No cortical or large vessel territory infarction. No mass lesion, hemorrhage, hydrocephalus or  extra-axial collection. Vascular: Major vessels at the base of the brain show flow. Skull and upper cervical spine: Negative Sinuses/Orbits: Clear/normal Other: None MRA HEAD FINDINGS Both internal carotid arteries are patent through the skull base and siphon regions. Ordinary siphon atherosclerotic change. The anterior and middle cerebral vessels are patent without identifiable branch vessel occlusion or correctable proximal stenosis. No aneurysm or vascular malformation. Both vertebral arteries are patent to the basilar. The right is a tiny vessel. No basilar stenosis. Superior cerebellar and posterior cerebral vessels show normal flow. IMPRESSION: MRI head: No acute finding. Generalized atrophy. Chronic small-vessel ischemic changes of the white matter. MRA head: No sign of large vessel occlusion. No significant proximal stenosis. Electronically Signed   By: Nelson Chimes M.D.   On: 09/07/2020 19:38   US RENAL  Result Date: 09/07/2020 CLINICAL DATA:  Acute renal insufficiency EXAM: RENAL / URINARY TRACT ULTRASOUND COMPLETE COMPARISON:  03/16/2020 FINDINGS: Right Kidney: Renal measurements: 5.1 x 12.3 x 6.4 cm = volume: 206.3 mL. Echogenicity within normal limits. No mass or hydronephrosis visualized. Left Kidney: Renal measurements: 13.4 x 6.4 by 6.4 cm = volume: 286.5 mL. Normal renal cortical echotexture. 1.6 cm cyst lower pole. No hydronephrosis or nephrolithiasis. Bladder: Appears normal for degree of bladder distention. Other: None. IMPRESSION: 1. Small simple left renal cyst. Otherwise unremarkable renal ultrasound. Electronically Signed   By: Randa Ngo M.D.   On: 09/07/2020 23:43   DG CHEST PORT 1 VIEW  Result Date: 09/08/2020 CLINICAL DATA:  Short of breath EXAM: PORTABLE CHEST 1 VIEW COMPARISON:  03/24/2020 FINDINGS: Cardiac enlargement with vascular congestion. Mild perihilar edema. Bibasilar airspace disease and bilateral effusions have developed in the interval. IMPRESSION: Mild congestive  heart failure with bibasilar atelectasis and mild edema. Small bilateral effusions. Electronically Signed   By: Franchot Gallo M.D.   On: 09/08/2020 20:15   ECHOCARDIOGRAM COMPLETE  Result Date: 09/08/2020    ECHOCARDIOGRAM REPORT   Patient Name:   Darryl Diaz Date of Exam: 09/08/2020 Medical Rec #:  [id number]        Height:  72.0 in Accession #:    [id number]       Weight:       234.0 lb Date of Birth:  April 14, 1941        BSA:          2.278 m Patient Age:    38 years         BP:           167/84 mmHg Patient Gender: M                HR:           80 bpm. Exam Location:  Inpatient Procedure: 2D Echo Indications:    Stroke I63.9  History:        Patient has prior history of Echocardiogram examinations, most                 recent 02/25/2020. Risk Factors:Hypertension and Dyslipidemia.  Sonographer:    Mikki Santee RDCS (AE) Referring Phys: [id number] [place]  1. Left ventricular ejection fraction, by estimation, is 60 to 65%. The left ventricle has normal function. The left ventricle has no regional wall motion abnormalities. There is mild left ventricular hypertrophy. Left ventricular diastolic parameters are consistent with Grade I diastolic dysfunction (impaired relaxation).  2. Right ventricular systolic function is normal. The right ventricular size is normal.  3. Left atrial size was mildly dilated.  4. The mitral valve is normal in structure. No evidence of mitral valve regurgitation. No evidence of mitral stenosis.  5. The aortic valve is tricuspid. Aortic valve regurgitation is not visualized. Mild aortic valve sclerosis is present, with no evidence of aortic valve stenosis.  6. There is borderline dilatation of the aortic root, measuring 41 mm.  7. The inferior vena cava is normal in size with greater than 50% respiratory variability, suggesting right atrial pressure of 3 mmHg. Conclusion(s)/Recommendation(s): No intracardiac source of embolism detected on this  transthoracic study. A transesophageal echocardiogram is recommended to exclude cardiac source of embolism if clinically indicated. FINDINGS  Left Ventricle: Left ventricular ejection fraction, by estimation, is 60 to 65%. The left ventricle has normal function. The left ventricle has no regional wall motion abnormalities. The left ventricular internal cavity size was normal in size. There is  mild left ventricular hypertrophy. Left ventricular diastolic parameters are consistent with Grade I diastolic dysfunction (impaired relaxation). Right Ventricle: The right ventricular size is normal. No increase in right ventricular wall thickness. Right ventricular systolic function is normal. Left Atrium: Left atrial size was mildly dilated. Right Atrium: Right atrial size was normal in size. Pericardium: There is no evidence of pericardial effusion. Mitral Valve: The mitral valve is normal in structure. Mild mitral annular calcification. No evidence of mitral valve regurgitation. No evidence of mitral valve stenosis. Tricuspid Valve: The tricuspid valve is normal in structure. Tricuspid valve regurgitation is not demonstrated. No evidence of tricuspid stenosis. Aortic Valve: The aortic valve is tricuspid. Aortic valve regurgitation is not visualized. Mild aortic valve sclerosis is present, with no evidence of aortic valve stenosis. Pulmonic Valve: The pulmonic valve was normal in structure. Pulmonic valve regurgitation is not visualized. No evidence of pulmonic stenosis. Aorta: The aortic root is normal in size and structure. There is borderline dilatation of the aortic root, measuring 41 mm. Venous: The inferior vena cava is normal in size with greater than 50% respiratory variability, suggesting right atrial pressure of 3 mmHg. IAS/Shunts: No atrial level shunt detected by color flow Doppler.  LEFT VENTRICLE PLAX 2D LVIDd:         4.80 cm  Diastology LVIDs:         3.00 cm  LV e' medial:    6.85 cm/s LV PW:         1.20  cm  LV E/e' medial:  9.5 LV IVS:        1.10 cm  LV e' lateral:   10.70 cm/s LVOT diam:     2.40 cm  LV E/e' lateral: 6.1 LV SV:         115 LV SV Index:   50 LVOT Area:     4.52 cm  RIGHT VENTRICLE RV S prime:     11.89 cm/s TAPSE (M-mode): 1.8 cm LEFT ATRIUM             Index       RIGHT ATRIUM           Index LA diam:        4.30 cm 1.89 cm/m  RA Area:     23.20 cm LA Vol (A2C):   95.0 ml 41.71 ml/m RA Volume:   65.80 ml  28.89 ml/m LA Vol (A4C):   82.6 ml 36.27 ml/m LA Biplane Vol: 96.1 ml 42.19 ml/m  AORTIC VALVE LVOT Vmax:   118.00 cm/s LVOT Vmean:  75.900 cm/s LVOT VTI:    0.254 m  AORTA Ao Root diam: 4.10 cm MITRAL VALVE MV Area (PHT): 2.11 cm    SHUNTS MV Decel Time: 359 msec    Systemic VTI:  0.25 m MV E velocity: 65.00 cm/s  Systemic Diam: 2.40 cm MV A velocity: 92.60 cm/s MV E/A ratio:  0.70 Candee Furbish MD Electronically signed by Candee Furbish MD Signature Date/Time: 09/08/2020/12:03:50 PM    Final    CT HEAD CODE STROKE WO CONTRAST  Result Date: 09/07/2020 CLINICAL DATA:  Code stroke.  Left-sided weakness. EXAM: CT HEAD WITHOUT CONTRAST TECHNIQUE: Contiguous axial images were obtained from the base of the skull through the vertex without intravenous contrast. COMPARISON:  07/08/2020 FINDINGS: Brain: Age related atrophy. Chronic small-vessel ischemic changes of the white matter. No sign of acute infarction, mass lesion, hemorrhage, hydrocephalus or extra-axial collection. Vascular: There is atherosclerotic calcification of the major vessels at the base of the brain. Skull: Negative Sinuses/Orbits: Clear/normal Other: None ASPECTS ([place] Stroke Program Early CT Score) - Ganglionic level infarction (caudate, lentiform nuclei, internal capsule, insula, M1-M3 cortex): 7 - Supraganglionic infarction (M4-M6 cortex): 3 Total score (0-10 with 10 being normal): 10 IMPRESSION: 1. No acute finding. Atrophy and chronic small-vessel ischemic change. 2. ASPECTS is 10. 3. These results were communicated to  Dr. Cheral Marker at 5:25 pmon 5/2/2022by text page via the [facility] messaging system. Electronically Signed   By: Nelson Chimes M.D.   On: 09/07/2020 17:26   VAS US CAROTID  Result Date: 09/08/2020 Carotid Arterial Duplex Study Patient Name:  Darryl Diaz  Date of Exam:   09/08/2020 Medical Rec #: [id number]         Accession #:    [id number] Date of Birth: 10/01/1940         Patient Gender: M Patient Age:   80Y Exam Location:  [facility] Procedure:      VAS US CAROTID Referring Phys: [id number] [place] --------------------------------------------------------------------------------  Indications:       LLE weakness. Risk Factors:      Hypertension, hyperlipidemia, no history of smoking. Other Factors:     Afib,  CHF, HX of cardiac arrest. Limitations        Today's exam was limited due to the patient's respiratory                    variation, the body habitus of the patient and patient                    movement. Comparison Study:  No previous studies Performing Technologist: Jody Hill RVT, RDMS  Examination Guidelines: A complete evaluation includes B-mode imaging, spectral Doppler, color Doppler, and power Doppler as needed of all accessible portions of each vessel. Bilateral testing is considered an integral part of a complete examination. Limited examinations for reoccurring indications may be performed as noted.  Right Carotid Findings: +----------+--------+--------+--------+------------------+-------------------+           PSV cm/sEDV cm/sStenosisPlaque DescriptionComments            +----------+--------+--------+--------+------------------+-------------------+ CCA Prox  62      9                                                     +----------+--------+--------+--------+------------------+-------------------+ CCA Distal50      10                                                    +----------+--------+--------+--------+------------------+-------------------+ ICA Prox  54       11                                                    +----------+--------+--------+--------+------------------+-------------------+ ICA Distal58      15                                Not well visualized +----------+--------+--------+--------+------------------+-------------------+ ECA       65      0                                                     +----------+--------+--------+--------+------------------+-------------------+ +----------+--------+-------+----------------+-------------------+           PSV cm/sEDV cmsDescribe        Arm Pressure (mmHG) +----------+--------+-------+----------------+-------------------+ Subclavian140            Multiphasic, WNL                    +----------+--------+-------+----------------+-------------------+ +---------+--------+--+--------+-+---------+ VertebralPSV cm/s29EDV cm/s6Antegrade +---------+--------+--+--------+-+---------+  Left Carotid Findings: +----------+--------+--------+--------+--------------------+-------------------+           PSV cm/sEDV cm/sStenosisPlaque Description  Comments            +----------+--------+--------+--------+--------------------+-------------------+ CCA Prox  76      0                                   intimal thickening  +----------+--------+--------+--------+--------------------+-------------------+  CCA Distal58      0               heterogenous and    intimal thickening                                    smooth                                  +----------+--------+--------+--------+--------------------+-------------------+ ICA Prox  45      11      1-39%   calcific,                                                                 heterogenous and                                                          smooth                                  +----------+--------+--------+--------+--------------------+-------------------+ ICA Distal62      14                                   Not well visualized +----------+--------+--------+--------+--------------------+-------------------+ ECA       68      0                                                       +----------+--------+--------+--------+--------------------+-------------------+ +----------+--------+--------+----------------+-------------------+           PSV cm/sEDV cm/sDescribe        Arm Pressure (mmHG) +----------+--------+--------+----------------+-------------------+ [id number]             Multiphasic, WNL                    +----------+--------+--------+----------------+-------------------+ +---------+--------+--------+--------------+ VertebralPSV cm/sEDV cm/sNot identified +---------+--------+--------+--------------+   Summary: Right Carotid: The extracranial vessels were near-normal with only minimal wall                thickening or plaque. Left Carotid: Velocities in the left ICA are consistent with a 1-39% stenosis.               Non-hemodynamically significant plaque <50% noted in the CCA. The               ECA appears <50% stenosed. Vertebrals:  Right vertebral artery demonstrates antegrade flow. Left vertebral              artery was not visualized. Subclavians: Normal flow hemodynamics were seen in bilateral subclavian              arteries. *See table(s) above for measurements and observations.  Electronically signed by Curt Jews MD on 09/08/2020 at 8:19:14 PM.    Final     Labs:  CBC: Recent Labs    09/07/20 1656 09/08/20 0510 09/09/20 1001 09/10/20 0418  WBC 7.4 8.0 5.9 6.9  HGB 8.0* 8.5* 7.6* 7.9*  HCT 23.7* 25.4* 22.9* 23.5*  PLT 230 239 219 236    COAGS: Recent Labs    02/22/20 2027 02/24/20 1319 03/16/20 0508 09/07/20 1656 09/09/20 1001  INR 1.2 1.5* 1.3* 1.4*  --   APTT  --   --   --   --  72*    BMP: Recent Labs    02/18/20 1625 02/22/20 1141 04/10/20 0922 06/03/20 1217 06/16/20 0858 07/08/20 0047 09/07/20 1656  09/08/20 0510 09/09/20 1001 09/10/20 0418  NA 137   < > 137 133* 137   < > 127* 132* 134* 136  K 4.7   < > 4.5 3.9 4.4   < > 4.6 4.1 3.5 3.8  CL 100   < > 102 98 100   < > 98 101 104 103  CO2 22   < > 24 22 22    < > 19* 19* 21* 24  GLUCOSE 82   < > 91 101* 84   < > 91 91 156* 106*  BUN 13   < > 13 12 16    < > 28* 29* 35* 33*  CALCIUM 8.8   < > 8.9 8.7 9.0   < > 8.1* 8.0* 7.9* 8.0*  CREATININE 1.06   < > 1.09 1.04 0.92   < > 2.82* 2.90* 3.36* 3.51*  GFRNONAA 67   < > 65 68 79  --  22* 21* 18* 17*  GFRAA 77  --  75 79 92  --   --   --   --   --    < > = values in this interval not displayed.    LIVER FUNCTION TESTS: Recent Labs    03/06/20 0422 03/16/20 0508 06/03/20 1217 09/07/20 1656  BILITOT 1.2 1.2 0.5 1.2  AST 24 32 46* 19  ALT 18 15 29 11   ALKPHOS 75 85 129* 106  PROT 5.7* 6.1* 5.7* 5.2*  ALBUMIN 3.0* 3.3* 3.4* 2.4*    TUMOR MARKERS: No results for input(s): AFPTM, CEA, CA199, CHROMGRNA in the last 8760 hours.  Assessment and Plan: Progressive renal failure Patient with extensive cardiac history, now presents with renal dysfunction.  SCr worsening since admission.  IR consulted for random renal biopsy.  Patient assessed at bedside.  He has held his Eliquis. He has been NPO.  Agreeable to procedure today.   Risks and benefits were discussed with the patient and/or patient's family including, but not limited to bleeding, infection, damage to adjacent structures or low yield requiring additional tests.  All of the questions were answered and there is agreement to proceed.  Consent signed and in chart.  Thank you for this interesting consult.  I greatly enjoyed meeting Darryl Diaz and look forward to participating in their care.  A copy of this report was sent to the requesting provider on this date.  Electronically Signed: Docia Barrier, PA 09/10/2020, 11:36 AM   I spent a total of 40 Minutes    in face to face in clinical consultation, greater  than 50% of which was counseling/coordinating care for progressive renal failure.
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
## 2020-09-10 NOTE — Progress Notes (Signed)
Occupational Therapy Treatment Patient Details Name: Darryl Diaz MRN: [id number] DOB: Dec 09, 1940 Today's Date: 09/10/2020    History of present illness 80 y.o. male admitted with L leg weaknes. MRI (-) for stroke. Work up underway for TIA per neurology. PMH significant for atrial fibrillation on Eliquis, HTN, CHF, ETOH abuse (in remission), HLD, OSA, GIB, B THA and cardiac arrest.   OT comments  Patient very motivated to keep moving, and making incremental gains to patient focused OT goals.  LOB noted to the R with standing upper body dressing.  Patient able to complete upper body ADL with setup from a seated position, and grooming with supervision in standing.  Patient requesting to walk a little in the halls.  Patient walked about 41' with Min Guard and RW, states he is close to his baseline.  Patient is currently looking to transition to ALF, and [facility] OT has been recommended.    Follow Up Recommendations  Home health OT;Other (comment)    Equipment Recommendations  None recommended by OT    Recommendations for Other Services      Precautions / Restrictions Precautions Precautions: Fall Restrictions Weight Bearing Restrictions: No       Mobility Bed Mobility   Bed Mobility: Sit to Supine       Sit to supine: Supervision        Transfers Overall transfer level: Needs assistance Equipment used: Rolling walker (2 wheeled) Transfers: Sit to/from Stand Sit to Stand: Min guard;Min assist         General transfer comment: for safety, slow to rise and requires rocking AP for momentum building.  Increased assist from lower surfaces    Balance Overall balance assessment: Needs assistance Sitting-balance support: Feet supported Sitting balance-Leahy Scale: Good     Standing balance support: Bilateral upper extremity supported Standing balance-Leahy Scale: Poor Standing balance comment: Reliant on BUE support                           ADL either  performed or assessed with clinical judgement   ADL       Grooming: Modified independent;Standing;Sitting   Upper Body Bathing: Modified independent;Sitting       Upper Body Dressing : Standing;Min guard                   Functional mobility during ADLs: Min guard;Rolling walker;Cueing for safety       Vision Baseline Vision/History: Wears glasses Wears Glasses: At all times Patient Visual Report: No change from baseline     Perception     Praxis      Cognition Arousal/Alertness: Awake/alert Behavior During Therapy: WFL for tasks assessed/performed Overall Cognitive Status: No family/caregiver present to determine baseline cognitive functioning                                 General Comments: admits to ST memory deficits        Exercises     Shoulder Instructions       General Comments  VSS on RA    Pertinent Vitals/ Pain       Pain Assessment: No/denies pain  Frequency  Min 2X/week        Progress Toward Goals  OT Goals(current goals can now be found in the care plan section)  Progress towards OT goals: Progressing toward goals  Acute Rehab OT Goals Patient Stated Goal: wants to start back to swimming OT Goal Formulation: With patient Time For Goal Achievement: 09/22/20 Potential to Achieve Goals: Good  Plan      Co-evaluation                 AM-PAC OT "6 Clicks" Daily Activity     Outcome Measure   Help from another person eating meals?: None Help from another person taking care of personal grooming?: None Help from another person toileting, which includes using toliet, bedpan, or urinal?: A Little Help from another person bathing (including washing, rinsing, drying)?: A Little Help from another person to put on and taking off regular upper body clothing?: None Help from another person to put on and taking off regular lower  body clothing?: A Little 6 Click Score: 21    End of Session Equipment Utilized During Treatment: Gait belt;Rolling walker  OT Visit Diagnosis: Unsteadiness on feet (R26.81);Other abnormalities of gait and mobility (R26.89);Muscle weakness (generalized) (M62.81);History of falling (Z91.81)   Activity Tolerance Patient tolerated treatment well   Patient Left in bed;with call bell/phone within reach;with bed alarm set   Nurse Communication Mobility status        Time: 0034-9179 OT Time Calculation (min): 27 min  Charges: OT General Charges $OT Visit: 1 Visit OT Treatments $Self Care/Home Management : 23-37 mins  09/10/2020  Rich, OTR/L  Acute Rehabilitation Services  Office:  [place] 09/10/2020, 10:09 AM

## 2020-09-10 NOTE — Consult Note (Signed)
Darryl Diaz Admit Date: 09/07/2020 09/10/2020 Darryl Diaz   Subjective: Patient reports feeling much better after having Lasix in regards to his breathing.  He has no new complaints this morning.    PMH Incudes:  Diastolic heart failure  Chronic kidney disease, stage 2  GERD  Atrial fibrillation (Eliquis, amiodarone)  Hypertension  Lower extremity edema  Tachybradycardia syndrome  Iron deficiency anemia  Alcoholic cirrhosis of liver    Creatinine, Ser (mg/dL)  Date Value  09/10/2020 3.51 (H)  09/09/2020 3.36 (H)  09/08/2020 2.90 (H)  09/07/2020 2.82 (H)  09/03/2020 1.78 (H)  07/08/2020 1.40 (H)  06/16/2020 0.92  06/03/2020 1.04  04/10/2020 1.09  03/20/2020 1.06   I/Os:  Intake/Output Summary (Last 24 hours) at 09/10/2020 1642 Last data filed at 09/10/2020 1300 Gross per 24 hour  Intake 60 ml  Output 1350 ml  Net -1290 ml    ROS NSAIDS: Patient denies IV Contrast: No recent IV contrast administered per chart review TMP/SMX: none prescribed per chart review  Hypotension : Patient reports no episodes of hypotension or dizziness while at home Balance of 12 systems is negative w/ exceptions as above  PMH  Past Medical History:  Diagnosis Date  . Alcoholism ([place])    in remission following wife's death  . Anemia   . 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 Bradycardia    low 20's  . Bradycardic cardiac arrest ([place])   . Cardiac arrest ([place]) 04/20/2019   12 min CPR with epi  . Chronic diastolic CHF (congestive heart failure) ([place])   . Fall at home 03/28/2016  . Fatty liver   . Gastritis   . GERD (gastroesophageal reflux disease)   . H/O seasonal allergies   . History of GI bleed   . Hx of adenomatous colonic polyps 08/10/2017  . Hyperlipidemia   . Hypertension   . Hypotension   . Insomnia   . 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 Mallory-Weiss tear   . OA (osteoarthritis)    hands  . OSA (obstructive sleep apnea)     No longer uses CPAP  . Persistent atrial fibrillation with rapid ventricular response ([place]) 02/05/2020  . Recurrent syncope   . 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([place])    [place]  Past Surgical History:  Procedure Laterality Date  . ANKLE SURGERY Left 2014   had rods put in   . BIOPSY  02/28/2019   Procedure: BIOPSY;  Surgeon: Milus Banister, MD;  Location: [facility] ENDOSCOPY;  Service: Endoscopy;;  . CARDIOVERSION  02/06/2020  . CARDIOVERSION N/A 02/06/2020   Procedure: CARDIOVERSION;  Surgeon: Werner Lean, MD;  Location: [facility] ENDOSCOPY;  Service: Cardiovascular;  Laterality: N/A;  . CARDIOVERSION N/A 02/24/2020   Procedure: CARDIOVERSION;  Surgeon: Werner Lean, MD;  Location: [facility] ENDOSCOPY;  Service: Cardiovascular;  Laterality: N/A;  . [place]  . ESOPHAGOGASTRODUODENOSCOPY (EGD) WITH PROPOFOL N/A 02/28/2019   Procedure: ESOPHAGOGASTRODUODENOSCOPY (EGD) WITH PROPOFOL;  Surgeon: Milus Banister, MD;  Location: [facility] ENDOSCOPY;  Service: Endoscopy;  Laterality: N/A;  . HIP ARTHROPLASTY Left 04/01/2016   Procedure: ARTHROPLASTY BIPOLAR HIP (HEMIARTHROPLASTY);  Surgeon: Paralee Cancel, MD;  Location: [facility];  Service: Orthopedics;  Laterality: Left;  . HIP CLOSED REDUCTION Left 04/30/2016   Procedure: CLOSED REDUCTION HIP;  Surgeon: Wylene Simmer, MD;  Location: [place];  Service: Orthopedics;  Laterality: Left;  . TONSILLECTOMY    . TOTAL HIP ARTHROPLASTY Right 10/15/2019   Procedure: TOTAL  HIP ARTHROPLASTY ANTERIOR APPROACH;  Surgeon: Paralee Cancel, MD;  Location: [facility] ORS;  Service: Orthopedics;  Laterality: Right;  70 mins   FH  Family History  Problem Relation Age of Onset  . Heart disease Mother 59  . Hypertension Mother   . Arthritis Mother   . Heart failure Father 13  . Stroke Maternal Aunt   . Heart failure Sister   . Colon cancer Neg Hx   . Esophageal cancer Neg Hx   . Stomach cancer Neg Hx   . Rectal cancer Neg Hx    SH  reports that he has never smoked. He  has never used smokeless tobacco. He reports previous alcohol use. He reports that he does not use drugs. Allergies  Allergies  Allergen Reactions  . Penicillins Other (See Comments)    Tolerated Cephalosporin Date: 10/15/19. Allergy is from childhood Did it involve swelling of the face/tongue/throat, SOB, or low BP? Unknown Did it involve sudden or severe rash/hives, skin peeling, or any reaction on the inside of your mouth or nose? Unknown Did you need to seek medical attention at a hospital or doctor's office? Unknown When did it last happen?childhood reaction If all above answers are "NO", may proceed with cephalosporin use.  Marland Kitchen Penicillins    Home medications Prior to Admission medications   Medication Sig Start Date End Date Taking? Authorizing Provider  acetaminophen (TYLENOL) 325 MG tablet Take 2 tablets (650 mg total) by mouth every 4 (four) hours as needed for headache or mild pain. 03/10/20  Yes Angiulli, Lavon Paganini, PA-C  amiodarone (PACERONE) 200 MG tablet Take 1 tablet (200 mg total) by mouth daily. 04/10/20  Yes Dunn, Dayna N, PA-C  atorvastatin (LIPITOR) 20 MG tablet Take 1 tablet (20 mg total) by mouth daily. 03/10/20  Yes Angiulli, [name] J, PA-C  ELIQUIS 5 MG TABS tablet TAKE 1 TABLET BY MOUTH TWICE DAILY. NEW CUSTOMER FOR [facility]. REQUESTING NEW PRESCRIPTION. Patient taking differently: Take 5 mg by mouth 2 (two) times daily. 08/12/20  Yes Sherren Mocha, MD  escitalopram (LEXAPRO) 10 MG tablet Take 1 tablet (10 mg total) by mouth daily. 03/10/20  Yes Angiulli, Lavon Paganini, PA-C  ferrous sulfate 325 (65 FE) MG tablet Take 1 tablet (325 mg total) by mouth 2 (two) times daily with a meal. 03/10/20 04/10/20 Yes Angiulli, Lavon Paganini, PA-C  folic acid (FOLVITE) 1 MG tablet Take 1 tablet (1 mg total) by mouth daily. 03/10/20  Yes Angiulli, Lavon Paganini, PA-C  furosemide (LASIX) 40 MG tablet Take 1 tablet (40 mg total) by mouth daily. 08/03/20 07/29/21 Yes Sherren Mocha, MD  loratadine  (CLARITIN) 10 MG tablet Take 1 tablet (10 mg total) by mouth daily. 02/12/20  Yes Medina-Vargas, Monina C, NP  Magnesium Oxide 400 MG CAPS Take 1 capsule (400 mg total) by mouth 2 (two) times daily. 05/04/20  Yes Dunn, Dayna N, PA-C  midodrine (PROAMATINE) 2.5 MG tablet Take 1 tablet (2.5 mg total) by mouth 3 (three) times daily with meals. 04/10/20  Yes Dunn, Dayna N, PA-C  Multiple Vitamins-Minerals (CENTRUM SILVER 50+MEN) TABS Take 1 tablet by mouth daily with breakfast.   Yes [provider]  nitroGLYCERIN (NITROSTAT) 0.4 MG SL tablet Place 1 tablet (0.4 mg total) under the tongue every 5 (five) minutes x 3 doses as needed for chest pain. 07/28/20  Yes Baldwin Jamaica, PA-C  pantoprazole (PROTONIX) 40 MG tablet Take 1 tablet (40 mg total) by mouth daily. 03/10/20  Yes Angiulli, Lavon Paganini, PA-C  potassium chloride SA (KLOR-CON) 20 MEQ tablet Take 2 tablets (40 mEq total) by mouth daily. Patient taking differently: Take 20 mEq by mouth 2 (two) times daily. 08/03/20 07/29/21 Yes Sherren Mocha, MD  tamsulosin (FLOMAX) 0.4 MG CAPS capsule Take 1 capsule (0.4 mg total) by mouth every evening. 03/10/20  Yes Angiulli, Lavon Paganini, PA-C  traZODone (DESYREL) 50 MG tablet Take 1 tablet (50 mg total) by mouth at bedtime. 03/10/20  Yes Angiulli, Lavon Paganini, PA-C  triamcinolone ointment (KENALOG) 0.1 % Apply 1 application topically 2 (two) times daily. legs 08/20/20  Yes [provider]  ketoconazole (NIZORAL) 2 % cream Apply 1 application topically daily. To dry scaly skin on feet Patient not taking: No sig reported 07/23/20   Criselda Peaches, DPM  silver sulfADIAZINE (SILVADENE) 1 % cream Apply 1 application topically daily. Patient not taking: No sig reported 07/23/20   Criselda Peaches, DPM    Current Medications Scheduled Meds: . amiodarone  200 mg Oral Daily  . amLODipine  5 mg Oral Daily  . [START ON 09/12/2020] apixaban  5 mg Oral BID  . atorvastatin  20 mg Oral Daily  . escitalopram  10 mg  Oral Daily  . fentaNYL      . ferrous sulfate  325 mg Oral BID WC  . folic acid  1 mg Oral Daily  . gelatin adsorbable      . lidocaine (PF)      . midazolam      . pantoprazole  40 mg Oral Daily  . tamsulosin  0.4 mg Oral QPM   CBC Recent Labs  Lab 09/07/20 1656 09/08/20 0510 09/09/20 1001 09/10/20 0418  WBC 7.4 8.0 5.9 6.9  NEUTROABS 5.9  --  5.1 5.5  HGB 8.0* 8.5* 7.6* 7.9*  HCT 23.7* 25.4* 22.9* 23.5*  MCV 98.3 99.2 99.6 99.2  PLT 230 239 219 397   Basic Metabolic Panel Recent Labs  Lab 09/07/20 1656 09/08/20 0510 09/09/20 1001 09/10/20 0418  NA 127* 132* 134* 136  K 4.6 4.1 3.5 3.8  CL 98 101 104 103  CO2 19* 19* 21* 24  GLUCOSE 91 91 156* 106*  BUN 28* 29* 35* 33*  CREATININE 2.82* 2.90* 3.36* 3.51*  CALCIUM 8.1* 8.0* 7.9* 8.0*  PHOS  --   --  4.7*  --     Physical Exam  Blood pressure (!) 148/83, pulse 79, temperature 98.4 F (36.9 C), temperature source Oral, resp. rate 18, height 6' (1.829 m), weight 114.6 kg, SpO2 98 %.  PHYSICAL EXAM  General: male appearing stated age in no acute distress Cardio: Normal S1 and S2, no S3 or S4. Rhythm is regular.  Pulm: Clear to auscultation bilaterally, no crackles, wheezing, or diminished breath sounds. Normal respiratory effort, stable on RA Abdomen: Bowel sounds normal. Abdomen soft and non-tender.  Extremities: 1+ peripheral edema. Warm/ well perfused.  Neuro: pt alert and oriented x4   Assessment DUFFY DANTONIO is a 80 y.o. male who presented with SOB and LLE weakness that have both resolved, now with worsening creatinine   1. 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 Volume Overload, improved  3. HTN   Plan 1. AKI on CKD,  Now stage 3B with hematuria:  1. Urine culture with no growth at 24 hours 2. Concern for rapid renal decline due to glomerulonephritic changes 3. ANCA pending 4. AntiDS DNA pending 5. Hepatitis B surface antigen, negative  6. HIV, negative 7. Kappa/lambda light  chains 8. Protein electrophoresis 9. Protein creatinine ratio elevated >5 10. Complement 3 and 4 levels, negative  11. Antistreptolysin O titers, WNL  12. Glomerular basement membrane antibodies  3. HTN: start amlodipine 5mg   4.  IR planning for renal bx today 09/10/20 5.  Urology consulted, recommended for outpatient follow up pending renal work up   Nephrology will follow along  [facility] Diaz  [phone] 09/10/2020, 4:42 PM

## 2020-09-10 NOTE — Procedures (Signed)
Interventional Radiology Procedure Note  Procedure: Ultrasound guided left renal biopsy  Findings: Please refer to procedural dictation for full description. Left inferior pole, 16 ga cores x 2.  Gelfoam needle track embolization.  Complications: None immediate.  Estimated Blood Loss: < 5 mL  Recommendations: Strict 3 hours bedrest. Follow up Pathology results.   Ruthann Cancer, MD Pager: [phone]

## 2020-09-10 NOTE — Progress Notes (Signed)
Patient ID: Darryl Diaz, male   DOB: [birth year], 80 y.o.   MRN: [id number]  PROGRESS NOTE    Darryl Diaz  IWP:[id number] DOB: 02-13-1941 DOA: 09/07/2020 PCP: [name], MD    Brief Narrative:  Darryl Diaz is a 80 y.o. male with PMH significant for paroxysmal atrial fibrillation on Eliquis, tachy-brady syndrome, chronic diastolic CHF, orthostatic hypotension, hyperlipidemia, iron deficiency anemia,chronic bilateral lower extremity edema,and depression/anxiety. Patient presented to the ED on 5/2 from home for an acute left lower extremity weakness which he noticed while driving his car. While in the ED, he felt gradual improvement in the weakness but not back to baseline At baseline, walks with a Rollator, planning to move to an ALF in next 1 to 2 months.  He was recently started on Lasix by his cardiologist because of worsening pedal edema.  In the ED, patient was afebrile, hemodynamically stable Labs with sodium level low at 127, creatinine elevated at 2.82 compared to his baseline of 1.78, hemoglobin low at 8 CT abnormal Neurology was consulted from the ED MRI brain without contrast was negative for acute finding.  MRA head was negative for large vessel occlusion or significant proximal stenosis. Admitted to hospitalist service for evaluation of TIA, AKI.   Assessment & Plan:   Principal Problem:   Transient weakness of left lower extremity Active Problems:   Hyperlipidemia   GERD (gastroesophageal reflux disease)   Depression   PAF (paroxysmal atrial fibrillation) (HCC)   Hyponatremia   AKI (acute kidney injury) ([place])   Chronic diastolic congestive heart failure (HCC)   Transient weaknessof left lower extremity -Was worked up as a TIA -MRI brain without contrast was negative for acute finding. MRA head was negative for large vessel occlusion or significant proximal stenosis. -Echocardiogram with EF 60 to 65%, no cardiac source of embolism -Carotid  duplex did not show any significant stenosis. -A1c 4.4, HDL 55, LDL 46 -Continue Eliquis per neurology recommendation -Monitor on telemetry, continue neurochecks -PT/OT/SLP eval obtained.  Home health PT recommended  Acute exacerbation of chronic diastolic CHF Chronic bilateral lower extremity edema - patient complained of progressively worsening dyspnea on exertion for the last few weeks.  He was seen by cardiologist Dr. Burt Knack on 3/28 and was placed on scheduled Lasix.  -In this presentation, patient is hypervolemic with chronic bilateral lower extremity edema but also has AKI at the same time. -5/3, echocardiogram with EF 60 to 65%, mild LVH, grade 1 diastolic dysfunction  -Cardiology consultation was obtained.  Given IV Lasix 80 mg 1 time   Patient clinically feels better but unable to repeat Lasix because of worsening AKI.    History of orthostatic hypotension -Was on midodrine 2.5 mg 3 times daily at home. -Currently midodrine is on hold because of elevated blood pressure -Continue to monitor blood pressure.  Hypertension in the setting of acute kidney injury -Nephrology started amlodipine this morning  Acute kidney injury: -Baseline creatinine seems normal in February 2022 but seems to have progressively worsening renal function over the last 2 months. -Presented with a creatinine elevated to 1.78 which has been steadily rising up, 3.8 today. -Renal ultrasound normal.   -Nephrology doing multiple labs to rule out acute kidney injury -Renal biopsy today -Urology declines further inpatient work-up -Avoid nephrotoxic agents  Acute hyponatremia -Sodium level was low at 127 at presentation.  Resolved  Hematuria Acute blood loss anemia -Hemoglobin between 10-11 at baseline.  Presented with hemoglobin low at 8.   -Currently having  light hematuria.  Hemoglobin down to 7.6--up to 7.9 today -Continue to monitor.  Paroxysmal atrial fibrillation Tachy-brady syndromewith history  of bradycardicarrest: -Currently in sinus rhythm with frequent PVCs on admission. Continue amiodarone.  -Because of hematuria, Eliquis was held and heparin drip was started by cardiology. -Transition back to Eliquis -Continue amiodarone -continue to monitor in telemetry  Hyperlipidemia Continue atorvastatin  Depression/anxiety Continue Lexapro.  GERD -Continue Protonix.  BPH Continue Flomax.   DVT prophylaxis: [id number] Code Status: Full code  Family Communication: Patient at bedside Disposition Plan: Hopefully home pending continued work-up   Consultants:   Neurology  Nephrology  GI  Cardiology  Procedures:  Echo  Antimicrobials: Anti-infectives (From admission, onward)   None       Subjective: Feels okay today  Objective: Vitals:   09/09/20 1923 09/10/20 0039 09/10/20 0404 09/10/20 0832  BP: (!) 151/70 (!) 150/66 (!) 159/87 (!) 171/77  Pulse: 78 76 82 78  Resp: 19 19 19 18   Temp: 98 F (36.7 C) 97.7 F (36.5 C) 97.7 F (36.5 C) 97.8 F (36.6 C)  TempSrc: Oral Oral Oral Oral  SpO2: 99% 93% 95% 97%  Weight:      Height:        Intake/Output Summary (Last 24 hours) at 09/10/2020 1033 Last data filed at 09/10/2020 0641 Gross per 24 hour  Intake --  Output 1150 ml  Net -1150 ml   Filed Weights   09/08/20 0830  Weight: 114.6 kg    Examination:  General exam: Appears calm and comfortable  Respiratory system: Clear to auscultation. Respiratory effort normal. Cardiovascular system: S1 & S2 heard, RRR.  Gastrointestinal system: Abdomen is nondistended, soft and nontender.  Central nervous system: Alert and oriented. No focal neurological deficits. Extremities: Symmetric  Skin: No rashes Psychiatry: Judgement and insight appear normal. Mood & affect appropriate.     Data Reviewed: I have personally reviewed following labs and imaging studies  CBC: Recent Labs  Lab 09/07/20 1656 09/08/20 0510 09/09/20 1001 09/10/20 0418  WBC  7.4 8.0 5.9 6.9  NEUTROABS 5.9  --  5.1 5.5  HGB 8.0* 8.5* 7.6* 7.9*  HCT 23.7* 25.4* 22.9* 23.5*  MCV 98.3 99.2 99.6 99.2  PLT 230 239 219 993   Basic Metabolic Panel: Recent Labs  Lab 09/03/20 1612 09/07/20 1656 09/08/20 0510 09/09/20 1001 09/10/20 0418  NA 133* 127* 132* 134* 136  K 4.6 4.6 4.1 3.5 3.8  CL 98 98 101 104 103  CO2 19* 19* 19* 21* 24  GLUCOSE 85 91 91 156* 106*  BUN 27 28* 29* 35* 33*  CREATININE 1.78* 2.82* 2.90* 3.36* 3.51*  CALCIUM 8.1* 8.1* 8.0* 7.9* 8.0*  MG  --   --   --  1.8  --   PHOS  --   --   --  4.7*  --    GFR: Estimated Creatinine Clearance: 22.3 mL/min (A) (by C-G formula based on SCr of 3.51 mg/dL (H)). Liver Function Tests: Recent Labs  Lab 09/07/20 1656  AST 19  ALT 11  ALKPHOS 106  BILITOT 1.2  PROT 5.2*  ALBUMIN 2.4*   Coagulation Profile: Recent Labs  Lab 09/07/20 1656  INR 1.4*   HbA1C: Recent Labs    09/07/20 1811  HGBA1C 4.4*   Lipid Profile: Recent Labs    09/07/20 2142  CHOL 109  HDL 55  LDLCALC 46  TRIG 40  CHOLHDL 2.0     Recent Results (from the past 240 hour(s))  Resp  Panel by RT-PCR (Flu A&B, Covid) Nasopharyngeal Swab     Status: None   Collection Time: 09/07/20  5:54 PM   Specimen: Nasopharyngeal Swab; Nasopharyngeal(NP) swabs in vial transport medium  Result Value Ref Range Status   SARS Coronavirus 2 by RT PCR NEGATIVE NEGATIVE Final    Comment: (NOTE) SARS-CoV-2 target nucleic acids are NOT DETECTED.  The SARS-CoV-2 RNA is generally detectable in upper respiratory specimens during the acute phase of infection. The lowest concentration of SARS-CoV-2 viral copies this assay can detect is 138 copies/mL. A negative result does not preclude SARS-Cov-2 infection and should not be used as the sole basis for treatment or other patient management decisions. A negative result may occur with  improper specimen collection/handling, submission of specimen other than nasopharyngeal swab, presence of  viral mutation(s) within the areas targeted by this assay, and inadequate number of viral copies(<138 copies/mL). A negative result must be combined with clinical observations, patient history, and epidemiological information. The expected result is Negative.  Fact Sheet for Patients:  EntrepreneurPulse.com.au  Fact Sheet for Healthcare Providers:  IncredibleEmployment.be  This test is no t yet approved or cleared by the [place] FDA and  has been authorized for detection and/or diagnosis of SARS-CoV-2 by FDA under an Emergency Use Authorization (EUA). This EUA will remain  in effect (meaning this test can be used) for the duration of the COVID-19 declaration under Section 564(b)(1) of the Act, 21 U.S.C.section 360bbb-3(b)(1), unless the authorization is terminated  or revoked sooner.       Influenza A by PCR NEGATIVE NEGATIVE Final   Influenza B by PCR NEGATIVE NEGATIVE Final    Comment: (NOTE) The Xpert Xpress SARS-CoV-2/FLU/RSV plus assay is intended as an aid in the diagnosis of influenza from Nasopharyngeal swab specimens and should not be used as a sole basis for treatment. Nasal washings and aspirates are unacceptable for Xpert Xpress SARS-CoV-2/FLU/RSV testing.  Fact Sheet for Patients: EntrepreneurPulse.com.au  Fact Sheet for Healthcare Providers: IncredibleEmployment.be  This test is not yet approved or cleared by the [place] FDA and has been authorized for detection and/or diagnosis of SARS-CoV-2 by FDA under an Emergency Use Authorization (EUA). This EUA will remain in effect (meaning this test can be used) for the duration of the COVID-19 declaration under Section 564(b)(1) of the Act, 21 U.S.C. section 360bbb-3(b)(1), unless the authorization is terminated or revoked.  Performed at [facility], [place] [address], [place], [place] [address]   Culture, Urine     Status:  None   Collection Time: 09/08/20  6:18 PM   Specimen: Urine, Catheterized  Result Value Ref Range Status   Specimen Description URINE, CATHETERIZED  Final   Special Requests NONE  Final   Culture   Final    NO GROWTH Performed at [facility] [address], [place], [place] [address]    Report Status 09/09/2020 FINAL  Final      Radiology Studies: DG CHEST PORT 1 VIEW  Result Date: 09/08/2020 CLINICAL DATA:  Short of breath EXAM: PORTABLE CHEST 1 VIEW COMPARISON:  03/24/2020 FINDINGS: Cardiac enlargement with vascular congestion. Mild perihilar edema. Bibasilar airspace disease and bilateral effusions have developed in the interval. IMPRESSION: Mild congestive heart failure with bibasilar atelectasis and mild edema. Small bilateral effusions. Electronically Signed   By: Franchot Gallo M.D.   On: 09/08/2020 20:15   ECHOCARDIOGRAM COMPLETE  Result Date: 09/08/2020    ECHOCARDIOGRAM REPORT   Patient Name:   Darryl Diaz  Ingraham Date of Exam: 09/08/2020 Medical Rec #:  [id number]        Height:       72.0 in Accession #:    [id number]       Weight:       234.0 lb Date of Birth:  05-Dec-1940        BSA:          2.278 m Patient Age:    58 years         BP:           167/84 mmHg Patient Gender: M                HR:           80 bpm. Exam Location:  Inpatient Procedure: 2D Echo Indications:    Stroke I63.9  History:        Patient has prior history of Echocardiogram examinations, most                 recent 02/25/2020. Risk Factors:Hypertension and Dyslipidemia.  Sonographer:    Mikki Santee RDCS (AE) Referring Phys: [id number] [place]  1. Left ventricular ejection fraction, by estimation, is 60 to 65%. The left ventricle has normal function. The left ventricle has no regional wall motion abnormalities. There is mild left ventricular hypertrophy. Left ventricular diastolic parameters are consistent with Grade I diastolic dysfunction (impaired relaxation).  2. Right ventricular  systolic function is normal. The right ventricular size is normal.  3. Left atrial size was mildly dilated.  4. The mitral valve is normal in structure. No evidence of mitral valve regurgitation. No evidence of mitral stenosis.  5. The aortic valve is tricuspid. Aortic valve regurgitation is not visualized. Mild aortic valve sclerosis is present, with no evidence of aortic valve stenosis.  6. There is borderline dilatation of the aortic root, measuring 41 mm.  7. The inferior vena cava is normal in size with greater than 50% respiratory variability, suggesting right atrial pressure of 3 mmHg. Conclusion(s)/Recommendation(s): No intracardiac source of embolism detected on this transthoracic study. A transesophageal echocardiogram is recommended to exclude cardiac source of embolism if clinically indicated. FINDINGS  Left Ventricle: Left ventricular ejection fraction, by estimation, is 60 to 65%. The left ventricle has normal function. The left ventricle has no regional wall motion abnormalities. The left ventricular internal cavity size was normal in size. There is  mild left ventricular hypertrophy. Left ventricular diastolic parameters are consistent with Grade I diastolic dysfunction (impaired relaxation). Right Ventricle: The right ventricular size is normal. No increase in right ventricular wall thickness. Right ventricular systolic function is normal. Left Atrium: Left atrial size was mildly dilated. Right Atrium: Right atrial size was normal in size. Pericardium: There is no evidence of pericardial effusion. Mitral Valve: The mitral valve is normal in structure. Mild mitral annular calcification. No evidence of mitral valve regurgitation. No evidence of mitral valve stenosis. Tricuspid Valve: The tricuspid valve is normal in structure. Tricuspid valve regurgitation is not demonstrated. No evidence of tricuspid stenosis. Aortic Valve: The aortic valve is tricuspid. Aortic valve regurgitation is not visualized.  Mild aortic valve sclerosis is present, with no evidence of aortic valve stenosis. Pulmonic Valve: The pulmonic valve was normal in structure. Pulmonic valve regurgitation is not visualized. No evidence of pulmonic stenosis. Aorta: The aortic root is normal in size and structure. There is borderline dilatation of the aortic root, measuring 41 mm. Venous: The inferior vena cava is normal in  size with greater than 50% respiratory variability, suggesting right atrial pressure of 3 mmHg. IAS/Shunts: No atrial level shunt detected by color flow Doppler.  LEFT VENTRICLE PLAX 2D LVIDd:         4.80 cm  Diastology LVIDs:         3.00 cm  LV e' medial:    6.85 cm/s LV PW:         1.20 cm  LV E/e' medial:  9.5 LV IVS:        1.10 cm  LV e' lateral:   10.70 cm/s LVOT diam:     2.40 cm  LV E/e' lateral: 6.1 LV SV:         115 LV SV Index:   50 LVOT Area:     4.52 cm  RIGHT VENTRICLE RV S prime:     11.89 cm/s TAPSE (M-mode): 1.8 cm LEFT ATRIUM             Index       RIGHT ATRIUM           Index LA diam:        4.30 cm 1.89 cm/m  RA Area:     23.20 cm LA Vol (A2C):   95.0 ml 41.71 ml/m RA Volume:   65.80 ml  28.89 ml/m LA Vol (A4C):   82.6 ml 36.27 ml/m LA Biplane Vol: 96.1 ml 42.19 ml/m  AORTIC VALVE LVOT Vmax:   118.00 cm/s LVOT Vmean:  75.900 cm/s LVOT VTI:    0.254 m  AORTA Ao Root diam: 4.10 cm MITRAL VALVE MV Area (PHT): 2.11 cm    SHUNTS MV Decel Time: 359 msec    Systemic VTI:  0.25 m MV E velocity: 65.00 cm/s  Systemic Diam: 2.40 cm MV A velocity: 92.60 cm/s MV E/A ratio:  0.70 Candee Furbish MD Electronically signed by Candee Furbish MD Signature Date/Time: 09/08/2020/12:03:50 PM    Final    VAS US CAROTID  Result Date: 09/08/2020 Carotid Arterial Duplex Study Patient Name:  Darryl Diaz  Date of Exam:   09/08/2020 Medical Rec #: [id number]         Accession #:    [id number] Date of Birth: [birth year]         Patient Gender: M Patient Age:   80Y Exam Location:  [facility] Procedure:      VAS US CAROTID  Referring Phys: [id number] [place] --------------------------------------------------------------------------------  Indications:       LLE weakness. Risk Factors:      Hypertension, hyperlipidemia, no history of smoking. Other Factors:     Afib, CHF, HX of cardiac arrest. Limitations        Today's exam was limited due to the patient's respiratory                    variation, the body habitus of the patient and patient                    movement. Comparison Study:  No previous studies Performing Technologist: Jody Hill RVT, RDMS  Examination Guidelines: A complete evaluation includes B-mode imaging, spectral Doppler, color Doppler, and power Doppler as needed of all accessible portions of each vessel. Bilateral testing is considered an integral part of a complete examination. Limited examinations for reoccurring indications may be performed as noted.  Right Carotid Findings: +----------+--------+--------+--------+------------------+-------------------+           PSV cm/sEDV cm/sStenosisPlaque DescriptionComments            +----------+--------+--------+--------+------------------+-------------------+  CCA Prox  62      9                                                     +----------+--------+--------+--------+------------------+-------------------+ CCA Distal50      10                                                    +----------+--------+--------+--------+------------------+-------------------+ ICA Prox  54      11                                                    +----------+--------+--------+--------+------------------+-------------------+ ICA Distal58      15                                Not well visualized +----------+--------+--------+--------+------------------+-------------------+ ECA       65      0                                                     +----------+--------+--------+--------+------------------+-------------------+  +----------+--------+-------+----------------+-------------------+           PSV cm/sEDV cmsDescribe        Arm Pressure (mmHG) +----------+--------+-------+----------------+-------------------+ Subclavian140            Multiphasic, WNL                    +----------+--------+-------+----------------+-------------------+ +---------+--------+--+--------+-+---------+ VertebralPSV cm/s29EDV cm/s6Antegrade +---------+--------+--+--------+-+---------+  Left Carotid Findings: +----------+--------+--------+--------+--------------------+-------------------+           PSV cm/sEDV cm/sStenosisPlaque Description  Comments            +----------+--------+--------+--------+--------------------+-------------------+ CCA Prox  76      0                                   intimal thickening  +----------+--------+--------+--------+--------------------+-------------------+ CCA Distal58      0               heterogenous and    intimal thickening                                    smooth                                  +----------+--------+--------+--------+--------------------+-------------------+ ICA Prox  45      11      1-39%   calcific,  heterogenous and                                                          smooth                                  +----------+--------+--------+--------+--------------------+-------------------+ ICA Distal62      14                                  Not well visualized +----------+--------+--------+--------+--------------------+-------------------+ ECA       68      0                                                       +----------+--------+--------+--------+--------------------+-------------------+ +----------+--------+--------+----------------+-------------------+           PSV cm/sEDV cm/sDescribe        Arm Pressure (mmHG)  +----------+--------+--------+----------------+-------------------+ [id number]             Multiphasic, WNL                    +----------+--------+--------+----------------+-------------------+ +---------+--------+--------+--------------+ VertebralPSV cm/sEDV cm/sNot identified +---------+--------+--------+--------------+   Summary: Right Carotid: The extracranial vessels were near-normal with only minimal wall                thickening or plaque. Left Carotid: Velocities in the left ICA are consistent with a 1-39% stenosis.               Non-hemodynamically significant plaque <50% noted in the CCA. The               ECA appears <50% stenosed. Vertebrals:  Right vertebral artery demonstrates antegrade flow. Left vertebral              artery was not visualized. Subclavians: Normal flow hemodynamics were seen in bilateral subclavian              arteries. *See table(s) above for measurements and observations.  Electronically signed by Curt Jews MD on 09/08/2020 at 8:19:14 PM.    Final      Scheduled Meds: . amiodarone  200 mg Oral Daily  . amLODipine  5 mg Oral Daily  . atorvastatin  20 mg Oral Daily  . escitalopram  10 mg Oral Daily  . ferrous sulfate  325 mg Oral BID WC  . folic acid  1 mg Oral Daily  . pantoprazole  40 mg Oral Daily  . tamsulosin  0.4 mg Oral QPM   Continuous Infusions:   LOS: 2 days    Donnamae Jude, MD 09/10/2020 10:33 AM [phone] [facility] If 7PM-7AM, please contact night-coverage 09/10/2020, 10:33 AM

## 2020-09-10 NOTE — Progress Notes (Signed)
Consulted by IR post renal biopsy procedure to evaluate for restart of anticoagulation. Per IR protocol, with high risk of bleeding can restart Apixaban 2 days post procedure in the AM. D/W Dr. Kennon Rounds who is OK to restart apixaban per protocol. Will restart PTA dose of Apixaban 5mg  BID on AM of 5/7.   Ilaisaane Marts A. Levada Dy, PharmD, BCPS, FNKF Clinical Pharmacist [facility] Please utilize Amion for appropriate phone number to reach the unit pharmacist ([place])

## 2020-09-11 LAB — CBC WITH DIFFERENTIAL/PLATELET
Abs Immature Granulocytes: 0.02 10*3/uL (ref 0.00–0.07)
Basophils Absolute: 0 10*3/uL (ref 0.0–0.1)
Basophils Relative: 0 %
Eosinophils Absolute: 0 10*3/uL (ref 0.0–0.5)
Eosinophils Relative: 0 %
HCT: 20.8 % — ABNORMAL LOW (ref 39.0–52.0)
Hemoglobin: 7 g/dL — ABNORMAL LOW (ref 13.0–17.0)
Immature Granulocytes: 0 %
Lymphocytes Relative: 15 %
Lymphs Abs: 0.8 10*3/uL (ref 0.7–4.0)
MCH: 33 pg (ref 26.0–34.0)
MCHC: 33.7 g/dL (ref 30.0–36.0)
MCV: 98.1 fL (ref 80.0–100.0)
Monocytes Absolute: 0.6 10*3/uL (ref 0.1–1.0)
Monocytes Relative: 10 %
Neutro Abs: 4.1 10*3/uL (ref 1.7–7.7)
Neutrophils Relative %: 75 %
Platelets: 217 10*3/uL (ref 150–400)
RBC: 2.12 MIL/uL — ABNORMAL LOW (ref 4.22–5.81)
RDW: 14.8 % (ref 11.5–15.5)
WBC: 5.5 10*3/uL (ref 4.0–10.5)
nRBC: 0 % (ref 0.0–0.2)

## 2020-09-11 LAB — PROTEIN ELECTROPHORESIS, SERUM
A/G Ratio: 1 (ref 0.7–1.7)
Albumin ELP: 2.6 g/dL — ABNORMAL LOW (ref 2.9–4.4)
Alpha-1-Globulin: 0.3 g/dL (ref 0.0–0.4)
Alpha-2-Globulin: 0.7 g/dL (ref 0.4–1.0)
Beta Globulin: 0.9 g/dL (ref 0.7–1.3)
Gamma Globulin: 0.7 g/dL (ref 0.4–1.8)
Globulin, Total: 2.6 g/dL (ref 2.2–3.9)
M-Spike, %: 0.2 g/dL — ABNORMAL HIGH
Total Protein ELP: 5.2 g/dL — ABNORMAL LOW (ref 6.0–8.5)

## 2020-09-11 LAB — MPO/PR-3 (ANCA) ANTIBODIES
ANCA Proteinase 3: <3.5 U/mL (ref 0.0–3.5)
Myeloperoxidase Abs: <9 U/mL (ref 0.0–9.0)

## 2020-09-11 LAB — BASIC METABOLIC PANEL
Anion gap: 9 (ref 5–15)
BUN: 35 mg/dL — ABNORMAL HIGH (ref 8–23)
CO2: 23 mmol/L (ref 22–32)
Calcium: 7.9 mg/dL — ABNORMAL LOW (ref 8.9–10.3)
Chloride: 106 mmol/L (ref 98–111)
Creatinine, Ser: 3.46 mg/dL — ABNORMAL HIGH (ref 0.61–1.24)
GFR, Estimated: 17 mL/min — ABNORMAL LOW (ref >60)
Glucose, Bld: 94 mg/dL (ref 70–99)
Potassium: 3.7 mmol/L (ref 3.5–5.1)
Sodium: 138 mmol/L (ref 135–145)

## 2020-09-11 LAB — ANCA TITERS
Atypical P-ANCA titer: <1:20 {titer}
C-ANCA: <1:20 {titer}
P-ANCA: <1:20 {titer}

## 2020-09-11 MED ORDER — SODIUM CHLORIDE 0.9 % IV SOLN
500.0000 mg | INTRAVENOUS | Status: AC
  Administered 2020-09-11 – 2020-09-13 (×3): 500 mg via INTRAVENOUS
  Filled 2020-09-11 (×3): qty 4

## 2020-09-11 NOTE — Progress Notes (Signed)
Patient was seen and examined this morning.  He is doing well, no specific complaints or concerns.  No significant dyspnea.  No back pain, gross hematuria.  Vitals are stable, blood pressures acceptable.  He is on room air.  He had approximately 1 L of urine output yesterday.  Creatinine is stable at 3.46, K3.7, HCO3 23.  Hemoglobin down to 7.0, if it goes any lower I think he will need a transfusion.  Results of renal biopsy are not yet back.  I do not anticipate them until Monday, 5/9, may be off a phone call from nephropathology this afternoon though.  SPEP is resulted with a very small M spike of 0.2 g/dL.  I doubt this is clinically significant.  We will wait for the result of the biopsy to put it into context.  Serum free light chains had a normal ratio.  If his ANCA serologies come back positive we will initiate corticosteroids.  Otherwise we will wait for the biopsy result.

## 2020-09-11 NOTE — Progress Notes (Signed)
Rec phone call from [facility].  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   STart solumedrol 500mg  po daiy x3d, further IS considerations based on MPO and PR3 values and more biposy data  RS
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
## 2020-09-11 NOTE — Progress Notes (Signed)
Physical Therapy Treatment Patient Details Name: Darryl Diaz MRN: [id number] DOB: Mar 11, 1941 Today's Date: 09/11/2020    History of Present Illness 80 y.o. male admitted with L leg weaknes. MRI (-) for stroke. Work up underway for TIA per neurology. PMH significant for atrial fibrillation on Eliquis, HTN, CHF, ETOH abuse (in remission), HLD, OSA, GIB, B THA and cardiac arrest.    PT Comments    Pt demonstrating improving activity tolerance today, ambulating further distance and participating in increased volume of exercise. Pt excited about his progress, PT to continue to follow acutely and progress mobility as able.    Follow Up Recommendations  Home health PT;Supervision for mobility/OOB     Equipment Recommendations  None recommended by PT    Recommendations for Other Services       Precautions / Restrictions Precautions Precautions: Fall Restrictions Weight Bearing Restrictions: No    Mobility  Bed Mobility Overal bed mobility: Needs Assistance Bed Mobility: Supine to Sit     Supine to sit: Supervision     General bed mobility comments: for safety, use of bedrails to elevate trunk to sitting.    Transfers Overall transfer level: Needs assistance Equipment used: Rolling walker (2 wheeled) Transfers: Sit to/from Stand Sit to Stand: Min guard         General transfer comment: for safety  Ambulation/Gait Ambulation/Gait assistance: Min guard Gait Distance (Feet): 175 Feet Assistive device: Rolling walker (2 wheeled) Gait Pattern/deviations: Step-through pattern;Decreased stride length;Trunk flexed Gait velocity: decr   General Gait Details: min guard for safety, verbal cuing for standing rest breaks x2 to recover dyspnea, SPO2 96% when assessed at DOE 2/4   Stairs             Wheelchair Mobility    Modified Rankin (Stroke Patients Only)       Balance Overall balance assessment: Needs assistance Sitting-balance support: Feet  supported Sitting balance-Leahy Scale: Good     Standing balance support: Bilateral upper extremity supported Standing balance-Leahy Scale: Poor Standing balance comment: Reliant on BUE support                            Cognition Arousal/Alertness: Awake/alert Behavior During Therapy: WFL for tasks assessed/performed Overall Cognitive Status: Within Functional Limits for tasks assessed                                        Exercises Other Exercises Other Exercises: STS x5, 2 sets Other Exercises: Standing marches x10, bilat, in combination with STSs    General Comments        Pertinent Vitals/Pain Pain Assessment: Faces Faces Pain Scale: No hurt Pain Intervention(s): Monitored during session    Home Living                      Prior Function            PT Goals (current goals can now be found in the care plan section) Acute Rehab PT Goals Patient Stated Goal: to return home with assistance of church friends PT Goal Formulation: With patient Time For Goal Achievement: 09/22/20 Potential to Achieve Goals: Fair Progress towards PT goals: Progressing toward goals    Frequency    Min 3X/week      PT Plan Current plan remains appropriate    Co-evaluation  AM-PAC PT "6 Clicks" Mobility   Outcome Measure  Help needed turning from your back to your side while in a flat bed without using bedrails?: None Help needed moving from lying on your back to sitting on the side of a flat bed without using bedrails?: None Help needed moving to and from a bed to a chair (including a wheelchair)?: A Little Help needed standing up from a chair using your arms (e.g., wheelchair or bedside chair)?: A Little Help needed to walk in hospital room?: A Little Help needed climbing 3-5 steps with a railing? : A Lot 6 Click Score: 19    End of Session Equipment Utilized During Treatment: Gait belt Activity Tolerance: Patient  tolerated treatment well Patient left: with call bell/phone within reach;in bed;with bed alarm set;Other (comment) (lab in room drawing blood at end of session) Nurse Communication: Mobility status PT Visit Diagnosis: Unsteadiness on feet (R26.81);Muscle weakness (generalized) (M62.81);History of falling (Z91.81)     Time: 2549-8264 PT Time Calculation (min) (ACUTE ONLY): 18 min  Charges:  $Gait Training: 8-22 mins                     Stacie Glaze, PT DPT Acute Rehabilitation Services Pager [phone]  Office [phone]    [place] 09/11/2020, 4:59 PM

## 2020-09-11 NOTE — Care Management Important Message (Signed)
Important Message  Patient Details  Name: Darryl Diaz MRN: [id number] Date of Birth: 07/02/1940   Medicare Important Message Given:  Yes     Orbie Pyo 09/11/2020, 2:55 PM

## 2020-09-11 NOTE — Progress Notes (Signed)
Patient ID: Darryl Diaz, male   DOB: 1940/10/21, 80 y.o.   MRN: [id number]  PROGRESS NOTE    Darryl Diaz  [name] DOB: [birth year] DOA: 09/07/2020 PCP: Darryl Pao, MD    Brief Narrative:   Darryl Diaz a 80 y.o.malewith PMH significant for paroxysmal atrial fibrillation on Eliquis, tachy-brady syndrome, chronic diastolic CHF, orthostatic hypotension, hyperlipidemia, iron deficiency anemia,chronic bilateral lower extremity edema,and depression/anxiety. Patient presented to the ED on 5/2 from home for an acute left lower extremity weakness which he noticed while driving his car. While in the ED, he felt gradual improvement in the weakness but not back to baseline At baseline, walks with a Rollator, planning to move to an ALF in next 1 to 2 months. He was recently started on Lasix by his cardiologist because of worsening pedal edema.  In the ED, patient was afebrile, hemodynamically stable Labs with sodium level low at 127, creatinine elevated at 2.82 compared to his baseline of 1.78, hemoglobin low at 8 CT abnormal Neurology was consulted from the ED MRI brain without contrast was negative for acute finding.  MRA head was negative for large vessel occlusion or significant proximal stenosis. Admitted to hospitalist servicefor evaluation of TIA, AKI.  Assessment & Plan:   Principal Problem:   Transient weakness of left lower extremity Active Problems:   Hyperlipidemia   GERD (gastroesophageal reflux disease)   Depression   PAF (paroxysmal atrial fibrillation) (HCC)   Hyponatremia   AKI (acute kidney injury) ([facility])   Chronic diastolic congestive heart failure (HCC) Transient weaknessof left lower extremity -Wasworked up as a TIA -MRI brain without contrast was negative for acute finding. MRA head was negative for large vessel occlusion or significant proximal stenosis. -Echocardiogram with EF 60 to 65%, no cardiac source of embolism -Carotid duplex  did not show any significant stenosis. -A1c 4.4, HDL 55, LDL 46 -Continue Eliquisper neurology recommendation -Monitor on telemetry, continue neurochecks -PT/OT/SLP eval obtained.Home health PT recommended  Acute exacerbation of chronic diastolic CHF Chronic bilateral lower extremity edema - patientcomplained ofprogressively worsening dyspnea on exertionfor the last few weeks. He was seen by cardiologist Dr. Burt Diaz on 3/28 and was placed on scheduled Lasix.  -In this presentation, patient is hypervolemic with chronic bilateral lower extremity edema but also has AKI at the same time. -5/3, echocardiogram with EF 60 to 65%, mild LVH, grade 1 diastolic dysfunction -Cardiology consultation was obtained. Given IV Lasix 80 mg 1 time  Patient clinically feels better but unable to repeat Lasix because of worsening AKI.   History of orthostatic hypotension -Was on midodrine 2.5 mg 3 times daily at home. -Currently midodrine is on hold because of elevated blood pressure -Continue to monitor blood pressure.  Hypertension in the setting of acute kidney injury -Nephrology started amlodipine   Acute kidney injury: -Baseline creatinine seems normal in February 2022 but seems to have progressively worsening renal function over the last 2 months. -Presented with a creatinine elevated to 1.78which has been steadily rising up, 3.8 today. -Renal ultrasound normal. 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  Resolved  Hematuria Acute blood loss anemia -Hemoglobin between 10-11 at baseline. Presented with hemoglobin low at 8.  -Currently having light hematuria. Hemoglobin down to 7.0 today--if drops further, will need transfusion No gross  hematuria -Continue  to monitor.  Paroxysmal atrial fibrillation Tachy-brady syndromewith history of bradycardicarrest: -Currently in sinus rhythm with frequent PVCs on admission. Continue amiodarone. -Because of hematuria, Eliquis was held and heparin drip was started by cardiology. -Transition back to Eliquis -Continue amiodarone -continue to monitor on telemetry  Hyperlipidemia Continue atorvastatin  Depression/anxiety Continue Lexapro.  GERD -Continue Protonix.  BPH Continue Flomax.    DVT prophylaxis: [id number] Code Status: Full code  Family Communication: patient at bedside Disposition Plan: Home hopefully pending improved renal function Home PT   Consultants:   Cardiology  GI  Nephrology  Neurology  Procedures:  ECHO  Antimicrobials: Anti-infectives (From admission, onward)   None       Subjective: Feels well. No complaints.  Objective: Vitals:   09/10/20 2333 09/11/20 0438 09/11/20 0736 09/11/20 0858  BP: (!) 155/71 (!) 155/64 (!) 151/71 (!) 145/57  Pulse: 79 72 75 79  Resp: 19 18 17    Temp: 98.4 F (36.9 C) 99 F (37.2 C) 98 F (36.7 C)   TempSrc: Oral Oral Oral   SpO2: 98% 100% 96%   Weight:      Height:        Intake/Output Summary (Last 24 hours) at 09/11/2020 0925 Last data filed at 09/11/2020 0900 Gross per 24 hour  Intake 60 ml  Output 1300 ml  Net -1240 ml   Filed Weights   09/08/20 0830  Weight: 114.6 kg    Examination:  General exam: Appears calm and comfortable  Respiratory system: Clear to auscultation. Respiratory effort normal. Cardiovascular system: S1 & S2 heard, RRR.  Gastrointestinal system: Abdomen is nondistended, soft and nontender.  Central nervous system: Alert and oriented. No focal neurological deficits. Extremities: Symmetric  Skin: No rashes Psychiatry: Judgement and insight appear normal. Mood & affect appropriate.     Data Reviewed: I have personally reviewed following labs and  imaging studies  CBC: Recent Labs  Lab 09/07/20 1656 09/08/20 0510 09/09/20 1001 09/10/20 0418 09/11/20 0339  WBC 7.4 8.0 5.9 6.9 5.5  NEUTROABS 5.9  --  5.1 5.5 4.1  HGB 8.0* 8.5* 7.6* 7.9* 7.0*  HCT 23.7* 25.4* 22.9* 23.5* 20.8*  MCV 98.3 99.2 99.6 99.2 98.1  PLT 230 239 219 236 417   Basic Metabolic Panel: Recent Labs  Lab 09/07/20 1656 09/08/20 0510 09/09/20 1001 09/10/20 0418 09/11/20 0339  NA 127* 132* 134* 136 138  K 4.6 4.1 3.5 3.8 3.7  CL 98 101 104 103 106  CO2 19* 19* 21* 24 23  GLUCOSE 91 91 156* 106* 94  BUN 28* 29* 35* 33* 35*  CREATININE 2.82* 2.90* 3.36* 3.51* 3.46*  CALCIUM 8.1* 8.0* 7.9* 8.0* 7.9*  MG  --   --  1.8  --   --   PHOS  --   --  4.7*  --   --    GFR: Estimated Creatinine Clearance: 22.6 mL/min (A) (by C-G formula based on SCr of 3.46 mg/dL (H)). Liver Function Tests: Recent Labs  Lab 09/07/20 1656  AST 19  ALT 11  ALKPHOS 106  BILITOT 1.2  PROT 5.2*  ALBUMIN 2.4*   Coagulation Profile: Recent Labs  Lab 09/07/20 1656  INR 1.4*   Sepsis Labs:   Recent Results (from the past 240 hour(s))  Resp Panel by RT-PCR (Flu A&B, Covid) Nasopharyngeal Swab     Status: None   Collection Time: 09/07/20  5:54 PM   Specimen: Nasopharyngeal Swab; Nasopharyngeal(NP) swabs in vial transport medium  Result Value Ref Range Status  SARS Coronavirus 2 by RT PCR NEGATIVE NEGATIVE Final    Comment: (NOTE) SARS-CoV-2 target nucleic acids are NOT DETECTED.  The SARS-CoV-2 RNA is generally detectable in upper respiratory specimens during the acute phase of infection. The lowest concentration of SARS-CoV-2 viral copies this assay can detect is 138 copies/mL. A negative result does not preclude SARS-Cov-2 infection and should not be used as the sole basis for treatment or other patient management decisions. A negative result may occur with  improper specimen collection/handling, submission of specimen other than nasopharyngeal swab, presence of  viral mutation(s) within the areas targeted by this assay, and inadequate number of viral copies(<138 copies/mL). A negative result must be combined with clinical observations, patient history, and epidemiological information. The expected result is Negative.  Fact Sheet for Patients:  EntrepreneurPulse.com.au  Fact Sheet for Healthcare Providers:  IncredibleEmployment.be  This test is no t yet approved or cleared by the [place] FDA and  has been authorized for detection and/or diagnosis of SARS-CoV-2 by FDA under an Emergency Use Authorization (EUA). This EUA will remain  in effect (meaning this test can be used) for the duration of the COVID-19 declaration under Section 564(b)(1) of the Act, 21 U.S.C.section 360bbb-3(b)(1), unless the authorization is terminated  or revoked sooner.       Influenza A by PCR NEGATIVE NEGATIVE Final   Influenza B by PCR NEGATIVE NEGATIVE Final    Comment: (NOTE) The Xpert Xpress SARS-CoV-2/FLU/RSV plus assay is intended as an aid in the diagnosis of influenza from Nasopharyngeal swab specimens and should not be used as a sole basis for treatment. Nasal washings and aspirates are unacceptable for Xpert Xpress SARS-CoV-2/FLU/RSV testing.  Fact Sheet for Patients: EntrepreneurPulse.com.au  Fact Sheet for Healthcare Providers: IncredibleEmployment.be  This test is not yet approved or cleared by the [place] FDA and has been authorized for detection and/or diagnosis of SARS-CoV-2 by FDA under an Emergency Use Authorization (EUA). This EUA will remain in effect (meaning this test can be used) for the duration of the COVID-19 declaration under Section 564(b)(1) of the Act, 21 U.S.C. section 360bbb-3(b)(1), unless the authorization is terminated or revoked.  Performed at [facility], [place] [address], [place], [place] [address]   Culture, Urine     Status:  None   Collection Time: 09/08/20  6:18 PM   Specimen: Urine, Catheterized  Result Value Ref Range Status   Specimen Description URINE, CATHETERIZED  Final   Special Requests NONE  Final   Culture   Final    NO GROWTH Performed at [facility] [address] [place],  [address]    Report Status 09/09/2020 FINAL  Final      Radiology Studies: CT ABDOMEN PELVIS WO CONTRAST  Result Date: 09/10/2020 CLINICAL DATA:  Hematuria, progressive renal insufficiency EXAM: CT ABDOMEN AND PELVIS WITHOUT CONTRAST TECHNIQUE: Multidetector CT imaging of the abdomen and pelvis was performed following the standard protocol without IV contrast. COMPARISON:  03/16/2020 FINDINGS: Lower chest: Small bilateral pleural effusions have developed with associated bibasilar compressive atelectasis. The visualized heart and pericardium are unremarkable. Hepatobiliary: Lobular hepatic contour with hypertrophy of the caudate and left hepatic lobes are in keeping with changes of cirrhosis. No definite intrahepatic mass identified on this noncontrast examination. Cholelithiasis again noted. No intra or extrahepatic biliary ductal dilation. Pancreas: Unremarkable. No pancreatic ductal dilatation or surrounding inflammatory changes. Spleen: Unremarkable Adrenals/Urinary Tract: The adrenal glands are unremarkable. The kidneys are normal in size and position. No hydronephrosis.  No intrarenal or ureteral calculi. There is a small amount of high attenuation material within the left posterior pararenal space in keeping with a small perinephric hematoma. Punctate foci of gas are also noted in this location and, together, the findings are in keeping with recent renal biopsy. No significant mass effect upon the kidney. Simple cortical cyst noted within the lower pole of the left kidney. Stomach/Bowel: Mild to moderate sigmoid diverticulosis. The stomach, small bowel, and large bowel are otherwise unremarkable. Appendix normal. No  free intraperitoneal gas or fluid. Vascular/Lymphatic: Mild to moderate aortoiliac atherosclerotic calcification. No aortic aneurysm. No pathologic adenopathy within the abdomen and pelvis. Reproductive: Prostate is unremarkable. Other: Small fat containing umbilical hernia. Diffuse body wall subcutaneous edema is present and there is edema within the a retroperitoneum in keeping with changes of mild anasarca. The rectum is unremarkable. 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 No lytic or blastic bone lesions are seen. Degenerative changes are noted throughout the lumbar spine. IMPRESSION: Small left perinephric hematoma with associated punctate foci of gas in keeping with expected changes post renal biopsy. No significant mass effect upon the left kidney. No hydronephrosis or intraluminal high attenuation material within the left renal collecting system. Vascular anomalies, such as a pseudoaneurysm or arteriovenous fistula, or active extravasation cannot be assessed on this noncontrast examination. Interval development of mild to moderate anasarca with small bilateral pleural effusions, diffuse body wall, and retroperitoneal edema. Cirrhosis. Cholelithiasis. Advanced spondylosis throughout the visualized thoracolumbar spine. Aortic Atherosclerosis (ICD10-I70.0). 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 EXAM: ULTRASOUND GUIDED RENAL BIOPSY COMPARISON:  09/07/2020 MEDICATIONS: None. ANESTHESIA/SEDATION: Fentanyl 25 mcg IV; Versed 1 mg IV Total Moderate Sedation time: 15 minutes; The patient was continuously monitored during the procedure by the interventional radiology nurse under my direct supervision. COMPLICATIONS: None immediate. PROCEDURE: Informed written consent was obtained from the patient after a discussion of the risks, benefits and  alternatives to treatment. The patient understands and consents the procedure. A timeout was performed prior to the initiation of the procedure. Ultrasound scanning was performed of the bilateral flanks. The inferior pole of the left kidney was selected for biopsy due to location and sonographic window. The procedure was planned. The operative site was prepped and draped in the usual sterile fashion. The overlying soft tissues were anesthetized with 1% lidocaine with epinephrine. A 16 gauge core needle biopsy device was advanced into the inferior cortex of the left kidney and 2 core biopsies were obtained under direct ultrasound guidance. Images were saved for documentation purposes. The biopsy device was removed with simultaneous injection of Gel-Foam slurry and hemostasis was obtained with manual compression. Post procedural scanning was negative for significant post procedural hemorrhage or additional complication. A dressing was placed. The patient tolerated the procedure well without immediate post procedural complication. IMPRESSION: Technically successful ultrasound guided left renal biopsy. Ruthann Cancer, MD [facility] Electronically Signed   By: Ruthann Cancer MD   On: 09/10/2020 13:02     Scheduled Meds: . amiodarone  200 mg Oral Daily  . amLODipine  5 mg Oral Daily  . [START ON 09/12/2020] apixaban  5 mg Oral BID  . atorvastatin  20 mg Oral Daily  . escitalopram  10 mg Oral Daily  . ferrous sulfate  325 mg Oral BID WC  . folic acid  1  mg Oral Daily  . pantoprazole  40 mg Oral Daily  . tamsulosin  0.4 mg Oral QPM   Continuous Infusions:   LOS: 3 days    Donnamae Jude, MD 09/11/2020 9:25 AM [phone] [facility] If 7PM-7AM, please contact night-coverage 09/11/2020, 9:25 AM

## 2020-09-11 NOTE — Progress Notes (Signed)
Darryl Diaz Admit Date: 09/07/2020 09/11/2020 Darryl Diaz   Subjective: Patient reports no new complaints today. Has questions regarding the results of biopsy and when those will be available. Continues to report improved respiratory status.      Creatinine, Ser (mg/dL)  Date Value  09/11/2020 3.46 (H)  09/10/2020 3.51 (H)  09/09/2020 3.36 (H)  09/08/2020 2.90 (H)  09/07/2020 2.82 (H)  09/03/2020 1.78 (H)  07/08/2020 1.40 (H)  06/16/2020 0.92  06/03/2020 1.04  04/10/2020 1.09   I/Os:  Intake/Output Summary (Last 24 hours) at 09/11/2020 1819 Last data filed at 09/11/2020 1754 Gross per 24 hour  Intake --  Output 1250 ml  Net -1250 ml    PMH  Past Medical History:  Diagnosis Date  . 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 Anemia   . 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 Bradycardia    low 20's  . Bradycardic cardiac arrest ([place])   . Cardiac arrest ([place]) 04/20/2019   12 min CPR with epi  . Chronic diastolic CHF (congestive heart failure) ([place])   . Fall at home 03/28/2016  . Fatty liver   . Gastritis   . GERD (gastroesophageal reflux disease)   . H/O seasonal allergies   . History of GI bleed   . Hx of adenomatous colonic polyps 08/10/2017  . Hyperlipidemia   . Hypertension   . Hypotension   . Insomnia   . 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 Mallory-Weiss tear   . OA (osteoarthritis)    hands  . OSA (obstructive sleep apnea)    No longer uses CPAP  . Persistent atrial fibrillation with rapid ventricular response ([place]) 02/05/2020  . Recurrent syncope   . 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([place])    [place]  Past Surgical History:  Procedure Laterality Date  . ANKLE SURGERY Left 2014   had rods put in   . BIOPSY  02/28/2019   Procedure: BIOPSY;  Surgeon: Milus Banister, MD;  Location: [facility] ENDOSCOPY;  Service: Endoscopy;;  . CARDIOVERSION  02/06/2020  . CARDIOVERSION N/A 02/06/2020   Procedure: CARDIOVERSION;  Surgeon:  Werner Lean, MD;  Location: [facility] ENDOSCOPY;  Service: Cardiovascular;  Laterality: N/A;  . CARDIOVERSION N/A 02/24/2020   Procedure: CARDIOVERSION;  Surgeon: Werner Lean, MD;  Location: [facility] ENDOSCOPY;  Service: Cardiovascular;  Laterality: N/A;  . [place]  . ESOPHAGOGASTRODUODENOSCOPY (EGD) WITH PROPOFOL N/A 02/28/2019   Procedure: ESOPHAGOGASTRODUODENOSCOPY (EGD) WITH PROPOFOL;  Surgeon: Milus Banister, MD;  Location: [facility] ENDOSCOPY;  Service: Endoscopy;  Laterality: N/A;  . HIP ARTHROPLASTY Left 04/01/2016   Procedure: ARTHROPLASTY BIPOLAR HIP (HEMIARTHROPLASTY);  Surgeon: Paralee Cancel, MD;  Location: [place];  Service: Orthopedics;  Laterality: Left;  . HIP CLOSED REDUCTION Left 04/30/2016   Procedure: CLOSED REDUCTION HIP;  Surgeon: Wylene Simmer, MD;  Location: [place];  Service: Orthopedics;  Laterality: Left;  . TONSILLECTOMY    . TOTAL HIP ARTHROPLASTY Right 10/15/2019   Procedure: TOTAL HIP ARTHROPLASTY ANTERIOR APPROACH;  Surgeon: Paralee Cancel, MD;  Location: [facility] ORS;  Service: Orthopedics;  Laterality: Right;  70 mins   FH  Family History  Problem Relation Age of Onset  . Heart disease Mother 91  . Hypertension Mother   . Arthritis Mother   . Heart failure Father 57  . Stroke Maternal Aunt   . Heart failure Sister   . Colon cancer Neg Hx   . Esophageal cancer  Neg Hx   . Stomach cancer Neg Hx   . Rectal cancer Neg Hx    SH  reports that he has never smoked. He has never used smokeless tobacco. He reports previous alcohol use. He reports that he does not use drugs. Allergies  Allergies  Allergen Reactions  . Penicillins Other (See Comments)    Tolerated Cephalosporin Date: 10/15/19. Allergy is from childhood Did it involve swelling of the face/tongue/throat, SOB, or low BP? Unknown Did it involve sudden or severe rash/hives, skin peeling, or any reaction on the inside of your mouth or nose? Unknown Did you need to seek medical attention  at a hospital or doctor's office? Unknown When did it last happen?childhood reaction If all above answers are "NO", may proceed with cephalosporin use.  Marland Kitchen Penicillins    Home medications Prior to Admission medications   Medication Sig Start Date End Date Taking? Authorizing Provider  acetaminophen (TYLENOL) 325 MG tablet Take 2 tablets (650 mg total) by mouth every 4 (four) hours as needed for headache or mild pain. 03/10/20  Yes Angiulli, Lavon Paganini, PA-C  amiodarone (PACERONE) 200 MG tablet Take 1 tablet (200 mg total) by mouth daily. 04/10/20  Yes Dunn, Dayna N, PA-C  atorvastatin (LIPITOR) 20 MG tablet Take 1 tablet (20 mg total) by mouth daily. 03/10/20  Yes Angiulli, [name] J, PA-C  ELIQUIS 5 MG TABS tablet TAKE 1 TABLET BY MOUTH TWICE DAILY. NEW CUSTOMER FOR [facility]. REQUESTING NEW PRESCRIPTION. Patient taking differently: Take 5 mg by mouth 2 (two) times daily. 08/12/20  Yes Sherren Mocha, MD  escitalopram (LEXAPRO) 10 MG tablet Take 1 tablet (10 mg total) by mouth daily. 03/10/20  Yes Angiulli, Lavon Paganini, PA-C  ferrous sulfate 325 (65 FE) MG tablet Take 1 tablet (325 mg total) by mouth 2 (two) times daily with a meal. 03/10/20 04/10/20 Yes Angiulli, Lavon Paganini, PA-C  folic acid (FOLVITE) 1 MG tablet Take 1 tablet (1 mg total) by mouth daily. 03/10/20  Yes Angiulli, Lavon Paganini, PA-C  furosemide (LASIX) 40 MG tablet Take 1 tablet (40 mg total) by mouth daily. 08/03/20 07/29/21 Yes Sherren Mocha, MD  loratadine (CLARITIN) 10 MG tablet Take 1 tablet (10 mg total) by mouth daily. 02/12/20  Yes Medina-Vargas, Monina C, NP  Magnesium Oxide 400 MG CAPS Take 1 capsule (400 mg total) by mouth 2 (two) times daily. 05/04/20  Yes Dunn, Dayna N, PA-C  midodrine (PROAMATINE) 2.5 MG tablet Take 1 tablet (2.5 mg total) by mouth 3 (three) times daily with meals. 04/10/20  Yes Dunn, Dayna N, PA-C  Multiple Vitamins-Minerals (CENTRUM SILVER 50+MEN) TABS Take 1 tablet by mouth daily with breakfast.   Yes  [provider]  nitroGLYCERIN (NITROSTAT) 0.4 MG SL tablet Place 1 tablet (0.4 mg total) under the tongue every 5 (five) minutes x 3 doses as needed for chest pain. 07/28/20  Yes [place], PA-C  pantoprazole (PROTONIX) 40 MG tablet Take 1 tablet (40 mg total) by mouth daily. 03/10/20  Yes Angiulli, Lavon Paganini, PA-C  potassium chloride SA (KLOR-CON) 20 MEQ tablet Take 2 tablets (40 mEq total) by mouth daily. Patient taking differently: Take 20 mEq by mouth 2 (two) times daily. 08/03/20 07/29/21 Yes Sherren Mocha, MD  tamsulosin (FLOMAX) 0.4 MG CAPS capsule Take 1 capsule (0.4 mg total) by mouth every evening. 03/10/20  Yes Angiulli, Lavon Paganini, PA-C  traZODone (DESYREL) 50 MG tablet Take 1 tablet (50 mg total) by mouth at bedtime. 03/10/20  Yes [place],  Lavon Paganini, PA-C  triamcinolone ointment (KENALOG) 0.1 % Apply 1 application topically 2 (two) times daily. legs 08/20/20  Yes [provider]  ketoconazole (NIZORAL) 2 % cream Apply 1 application topically daily. To dry scaly skin on feet Patient not taking: No sig reported 07/23/20   Criselda Peaches, DPM  silver sulfADIAZINE (SILVADENE) 1 % cream Apply 1 application topically daily. Patient not taking: No sig reported 07/23/20   Criselda Peaches, DPM    Current Medications Scheduled Meds: . amiodarone  200 mg Oral Daily  . amLODipine  5 mg Oral Daily  . [START ON 09/12/2020] apixaban  5 mg Oral BID  . atorvastatin  20 mg Oral Daily  . escitalopram  10 mg Oral Daily  . ferrous sulfate  325 mg Oral BID WC  . folic acid  1 mg Oral Daily  . pantoprazole  40 mg Oral Daily  . tamsulosin  0.4 mg Oral QPM   CBC Recent Labs  Lab 09/09/20 1001 09/10/20 0418 09/11/20 0339  WBC 5.9 6.9 5.5  NEUTROABS 5.1 5.5 4.1  HGB 7.6* 7.9* 7.0*  HCT 22.9* 23.5* 20.8*  MCV 99.6 99.2 98.1  PLT 219 236 384   Basic Metabolic Panel Recent Labs  Lab 09/07/20 1656 09/08/20 0510 09/09/20 1001 09/10/20 0418 09/11/20 0339  NA 127* 132*  134* 136 138  K 4.6 4.1 3.5 3.8 3.7  CL 98 101 104 103 106  CO2 19* 19* 21* 24 23  GLUCOSE 91 91 156* 106* 94  BUN 28* 29* 35* 33* 35*  CREATININE 2.82* 2.90* 3.36* 3.51* 3.46*  CALCIUM 8.1* 8.0* 7.9* 8.0* 7.9*  PHOS  --   --  4.7*  --   --     Physical Exam  Blood pressure (!) 146/73, pulse 68, temperature 98.5 F (36.9 C), temperature source Oral, resp. rate 20, height 6' (1.829 m), weight 114.6 kg, SpO2 100 %.  PHYSICAL EXAM  General: male appearing stated age in no acute distress, sitting upright in bed  Cardio: Normal S1 and S2, no S3 or S4. Rhythm is regular. No murmurs or rubs.  Bilateral radial pulses palpable Pulm: Clear to auscultation bilaterally, no crackles, wheezing, or diminished breath sounds. Normal respiratory effort, stable on RA Abdomen: Bowel sounds normal. Abdomen soft and non-tender.  Extremities: trace peripheral edema. Warm/ well perfused.  Neuro: pt alert and oriented x4  X4, conversant   Assessment Darryl Diaz is a 80 y.o. male who presented with SOB and LLE weakness that have both resolved, now with worsening creatinine   1. 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 Volume Overload, improved  3. HTN   Plan 1. AKI on CKD, improving Cr down from 3.5 to 3.46:  1. Urine culture with no growth to date  2. ANCA, negative  3. AntiDS DNA, negative  4. Kappa/lambda light chains, increased kappa and lambda chains  5. Protein electrophoresis, slightly increased M spike  6. Glomerular basement membrane antibodies  3. HTN: still hypertensive, continue to monitor BP, continue amlodipine 5mg   4.  IR completed renal bx, abd/pelvic CT consistent with post bx changes    Nephrology will follow along  [facility] Diaz  [phone] 09/11/2020, 6:19 PM

## 2020-09-12 DIAGNOSIS — N179 Acute kidney failure, unspecified: Secondary | ICD-10-CM | POA: Diagnosis not present

## 2020-09-12 LAB — RENAL FUNCTION PANEL
Albumin: 2.2 g/dL — ABNORMAL LOW (ref 3.5–5.0)
Anion gap: 8 (ref 5–15)
BUN: 36 mg/dL — ABNORMAL HIGH (ref 8–23)
CO2: 21 mmol/L — ABNORMAL LOW (ref 22–32)
Calcium: 7.8 mg/dL — ABNORMAL LOW (ref 8.9–10.3)
Chloride: 107 mmol/L (ref 98–111)
Creatinine, Ser: 3.24 mg/dL — ABNORMAL HIGH (ref 0.61–1.24)
GFR, Estimated: 19 mL/min — ABNORMAL LOW (ref >60)
Glucose, Bld: 162 mg/dL — ABNORMAL HIGH (ref 70–99)
Phosphorus: 4.1 mg/dL (ref 2.5–4.6)
Potassium: 3.4 mmol/L — ABNORMAL LOW (ref 3.5–5.1)
Sodium: 136 mmol/L (ref 135–145)

## 2020-09-12 LAB — HEPATITIS B CORE ANTIBODY, TOTAL: Hep B Core Total Ab: NONREACTIVE

## 2020-09-12 LAB — CBC
HCT: 24 % — ABNORMAL LOW (ref 39.0–52.0)
Hemoglobin: 8 g/dL — ABNORMAL LOW (ref 13.0–17.0)
MCH: 32.9 pg (ref 26.0–34.0)
MCHC: 33.3 g/dL (ref 30.0–36.0)
MCV: 98.8 fL (ref 80.0–100.0)
Platelets: 237 10*3/uL (ref 150–400)
RBC: 2.43 MIL/uL — ABNORMAL LOW (ref 4.22–5.81)
RDW: 14.5 % (ref 11.5–15.5)
WBC: 4 10*3/uL (ref 4.0–10.5)
nRBC: 0 % (ref 0.0–0.2)

## 2020-09-12 NOTE — Progress Notes (Signed)
PROGRESS NOTE  Darryl Diaz:[id number] DOB: 09/25/1940 DOA: 09/07/2020 PCP: [name], MD   LOS: 4 days   Brief Narrative / Interim history: 80 year old male with paroxysmal A. fib on Eliquis, tachybradycardia syndrome, chronic diastolic CHF, orthostatic hypotension, hyperlipidemia, iron deficiency anemia, chronic bilateral lower extremity edema, depression came into the hospital with weakness.  He was admitted on 5/2, she was also found to have acute kidney injury and hyponatremia.  His weakness was mainly on the left lower extremity, an MRI of the brain was negative for acute CVA.  Subjective / 24h Interval events: He is doing better this morning, eating breakfast.  Feels improved.  No chest pain, no shortness of breath.  Assessment & Plan: Principal Problem Acute kidney injury, microhematuria-patient's prior creatinine was normal in February 2022, progressively worsening over the last couple months.  Nephrology following.  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  He has been started on steroids -Creatinine stable in the 3 range this morning -Urology consulted with recommended outpatient follow-up if no renal causes for hematuria are identified  Active Problems TIA, left lower extremity weakness-MRI brain without contrast was negative for acute finding. MRA head was negative for large vessel occlusion or significant proximal stenosis. Echocardiogram with EF 60 to 65%, no cardiac source of embolism. Carotid duplex did not show any significant stenosis. A1c 4.4, HDL 55, LDL 46. Continue Eliquisper neurology recommendation. PT/OT/SLP eval obtained.Home health PT recommended  Acute on chronic diastolic CHF, chronic MEQAS-3/4, echocardiogram with EF 60 to 65%, mild LVH, grade 1 diastolic dysfunction. Patientcomplained ofprogressively worsening dyspnea on exertionfor the last few weeks. He was seen by  cardiologist Dr. Burt Knack on 3/28 and was placed on scheduled Lasix as an outpatient.  He was given Lasix x1 here on admission with improvement.  No further diuretics  Anemia, of chronic disease-no bleeding, hemoglobin stable  Hypertension-continue amlodipine  History of orthostatic hypotension-Home midodrine is on hold, currently hypotensive  Hyponatremia-hyperkalemia, resolved  Paroxysmal A. fib, tachybradycardia syndrome -continue amiodarone, currently in sinus rhythm.  Continue Eliquis  Hyperlipidemia-continue statin  Depression/anxiety-continue Lexapro  BPH-continue Flomax  Scheduled Meds: . amiodarone  200 mg Oral Daily  . amLODipine  5 mg Oral Daily  . apixaban  5 mg Oral BID  . atorvastatin  20 mg Oral Daily  . escitalopram  10 mg Oral Daily  . ferrous sulfate  325 mg Oral BID WC  . folic acid  1 mg Oral Daily  . pantoprazole  40 mg Oral Daily  . tamsulosin  0.4 mg Oral QPM   Continuous Infusions: . methylPREDNISolone (SOLU-MEDROL) injection 500 mg (09/11/20 1802)   PRN Meds:.acetaminophen **OR** acetaminophen (TYLENOL) oral liquid 160 mg/5 mL **OR** acetaminophen, senna-docusate  Diet Orders (From admission, onward)    Start     Ordered   09/10/20 1300  Diet renal with fluid restriction Fluid restriction: 1200 mL Fluid; Room service appropriate? Yes; Fluid consistency: Thin  Diet effective now       Comments: Can start when passes stroke swallow screen.  Question Answer Comment  Fluid restriction: 1200 mL Fluid   Room service appropriate? Yes   Fluid consistency: Thin      09/10/20 1300          DVT prophylaxis:  apixaban (ELIQUIS) tablet 5 mg     Code Status: Full Code  Family Communication: no family at bedside   Status is: Inpatient  Remains inpatient appropriate because:Inpatient  level of care appropriate due to severity of illness   Dispo: The patient is from: Home              Anticipated d/c is to: Home              Patient currently is not  medically stable to d/c.   Difficult to place patient No  Level of care: Telemetry Medical  Consultants:  Neurology Cardiology Urology Nephrology  Procedures:  2D echo  Microbiology  none  Antimicrobials: none    Objective: Vitals:   09/11/20 1519 09/11/20 1946 09/11/20 2338 09/12/20 0858  BP: (!) 146/73 (!) 167/76 (!) 167/77 130/61  Pulse: 68 78 77 82  Resp: 20 20  18   Temp: 98.5 F (36.9 C) 98.4 F (36.9 C) 98.2 F (36.8 C) 97.7 F (36.5 C)  TempSrc: Oral Oral Oral Oral  SpO2: 100% 95% 98% 98%  Weight:      Height:        Intake/Output Summary (Last 24 hours) at 09/12/2020 1132 Last data filed at 09/12/2020 0300 Gross per 24 hour  Intake --  Output 550 ml  Net -550 ml   Filed Weights   09/08/20 0830  Weight: 114.6 kg    Examination:  Constitutional: NAD Eyes: no scleral icterus ENMT: Mucous membranes are moist.  Neck: normal, supple Respiratory: clear to auscultation bilaterally, no wheezing, no crackles. Normal respiratory effort. Cardiovascular: Regular rate and rhythm, no murmurs / rubs / gallops. No LE edema. Good peripheral pulses Abdomen: non distended, no tenderness. Bowel sounds positive.  Musculoskeletal: no clubbing / cyanosis.  Skin: no rashes Neurologic: CN 2-12 grossly intact. Strength 5/5 in all 4.  Psychiatric: Normal judgment and insight. Alert and oriented x 3. Normal mood.     Data Reviewed: I have independently reviewed following labs and imaging studies   CBC: Recent Labs  Lab 09/07/20 1656 09/08/20 0510 09/09/20 1001 09/10/20 0418 09/11/20 0339 09/12/20 0805  WBC 7.4 8.0 5.9 6.9 5.5 4.0  NEUTROABS 5.9  --  5.1 5.5 4.1  --   HGB 8.0* 8.5* 7.6* 7.9* 7.0* 8.0*  HCT 23.7* 25.4* 22.9* 23.5* 20.8* 24.0*  MCV 98.3 99.2 99.6 99.2 98.1 98.8  PLT 230 239 219 236 217 601   Basic Metabolic Panel: Recent Labs  Lab 09/08/20 0510 09/09/20 1001 09/10/20 0418 09/11/20 0339 09/12/20 0159  NA 132* 134* 136 138 136  K 4.1 3.5  3.8 3.7 3.4*  CL 101 104 103 106 107  CO2 19* 21* 24 23 21*  GLUCOSE 91 156* 106* 94 162*  BUN 29* 35* 33* 35* 36*  CREATININE 2.90* 3.36* 3.51* 3.46* 3.24*  CALCIUM 8.0* 7.9* 8.0* 7.9* 7.8*  MG  --  1.8  --   --   --   PHOS  --  4.7*  --   --  4.1   Liver Function Tests: Recent Labs  Lab 09/07/20 1656 09/12/20 0159  AST 19  --   ALT 11  --   ALKPHOS 106  --   BILITOT 1.2  --   PROT 5.2*  --   ALBUMIN 2.4* 2.2*   Coagulation Profile: Recent Labs  Lab 09/07/20 1656  INR 1.4*   HbA1C: No results for input(s): HGBA1C in the last 72 hours. CBG: No results for input(s): GLUCAP in the last 168 hours.  Recent Results (from the past 240 hour(s))  Resp Panel by RT-PCR (Flu A&B, Covid) Nasopharyngeal Swab     Status: None  Collection Time: 09/07/20  5:54 PM   Specimen: Nasopharyngeal Swab; Nasopharyngeal(NP) swabs in vial transport medium  Result Value Ref Range Status   SARS Coronavirus 2 by RT PCR NEGATIVE NEGATIVE Final    Comment: (NOTE) SARS-CoV-2 target nucleic acids are NOT DETECTED.  The SARS-CoV-2 RNA is generally detectable in upper respiratory specimens during the acute phase of infection. The lowest concentration of SARS-CoV-2 viral copies this assay can detect is 138 copies/mL. A negative result does not preclude SARS-Cov-2 infection and should not be used as the sole basis for treatment or other patient management decisions. A negative result may occur with  improper specimen collection/handling, submission of specimen other than nasopharyngeal swab, presence of viral mutation(s) within the areas targeted by this assay, and inadequate number of viral copies(<138 copies/mL). A negative result must be combined with clinical observations, patient history, and epidemiological information. The expected result is Negative.  Fact Sheet for Patients:  EntrepreneurPulse.com.au  Fact Sheet for Healthcare Providers:   IncredibleEmployment.be  This test is no t yet approved or cleared by the [place] FDA and  has been authorized for detection and/or diagnosis of SARS-CoV-2 by FDA under an Emergency Use Authorization (EUA). This EUA will remain  in effect (meaning this test can be used) for the duration of the COVID-19 declaration under Section 564(b)(1) of the Act, 21 U.S.C.section 360bbb-3(b)(1), unless the authorization is terminated  or revoked sooner.       Influenza A by PCR NEGATIVE NEGATIVE Final   Influenza B by PCR NEGATIVE NEGATIVE Final    Comment: (NOTE) The Xpert Xpress SARS-CoV-2/FLU/RSV plus assay is intended as an aid in the diagnosis of influenza from Nasopharyngeal swab specimens and should not be used as a sole basis for treatment. Nasal washings and aspirates are unacceptable for Xpert Xpress SARS-CoV-2/FLU/RSV testing.  Fact Sheet for Patients: EntrepreneurPulse.com.au  Fact Sheet for Healthcare Providers: IncredibleEmployment.be  This test is not yet approved or cleared by the [place] FDA and has been authorized for detection and/or diagnosis of SARS-CoV-2 by FDA under an Emergency Use Authorization (EUA). This EUA will remain in effect (meaning this test can be used) for the duration of the COVID-19 declaration under Section 564(b)(1) of the Act, 21 U.S.C. section 360bbb-3(b)(1), unless the authorization is terminated or revoked.  Performed at [facility], [place] [address] [place], [place] [address]   Culture, Urine     Status: None   Collection Time: 09/08/20  6:18 PM   Specimen: Urine, Catheterized  Result Value Ref Range Status   Specimen Description URINE, CATHETERIZED  Final   Special Requests NONE  Final   Culture   Final    NO GROWTH Performed at [place] [address] [place], [place] [address]    Report Status 09/09/2020 FINAL  Final     Radiology Studies: No  results found.  Marzetta Board, MD, PhD [facility]  Between 7 am - 7 pm I am available, please contact me via Amion (for emergencies) or Securechat (non urgent messages)  Between 7 pm - 7 am I am not available, please contact night coverage MD/APP via Amion

## 2020-09-12 NOTE — Plan of Care (Signed)
  Problem: Education: Goal: Knowledge of secondary prevention will improve Outcome: Progressing Goal: Knowledge of patient specific risk factors addressed and post discharge goals established will improve Outcome: Progressing   Problem: Education: Goal: Knowledge of General Education information will improve Description: Including pain rating scale, medication(s)/side effects and non-pharmacologic comfort measures Outcome: Progressing   Problem: Health Behavior/Discharge Planning: Goal: Ability to manage health-related needs will improve Outcome: Progressing   Problem: Clinical Measurements: Goal: Ability to maintain clinical measurements within normal limits will improve Outcome: Progressing Goal: Will remain free from infection Outcome: Progressing Goal: Diagnostic test results will improve Outcome: Progressing Goal: Respiratory complications will improve Outcome: Progressing Goal: Cardiovascular complication will be avoided Outcome: Progressing   Problem: Activity: Goal: Risk for activity intolerance will decrease Outcome: Progressing   Problem: Nutrition: Goal: Adequate nutrition will be maintained Outcome: Progressing   Problem: Coping: Goal: Level of anxiety will decrease Outcome: Progressing   Problem: Elimination: Goal: Will not experience complications related to bowel motility Outcome: Progressing Goal: Will not experience complications related to urinary retention Outcome: Progressing   Problem: Pain Managment: Goal: General experience of comfort will improve Outcome: Progressing   Problem: Safety: Goal: Ability to remain free from injury will improve Outcome: Progressing   Problem: Skin Integrity: Goal: Risk for impaired skin integrity will decrease Outcome: Progressing   

## 2020-09-12 NOTE — Progress Notes (Signed)
Admit: 09/07/2020 LOS: 4  69M with rapid decline in GFR, tea colored hematuria, nephrotic range proteinuria  Subjective:  . 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 Based upon that information we started pulse Solu-Medrol 500 mg for 3 days, yesterday, he has received the first dose and tolerated well . His ANCA serologies are now negative, specifically negative MPO and PR-3 . He has no complaints this morning . Creatinine slightly improved to 3.2, K3.4 . 0.9 L, at least, urine output in the past 24 hours  05/06 0701 - 05/07 0700 In: -  Out: 900 [Urine:900]  Filed Weights   09/08/20 0830  Weight: 114.6 kg    Scheduled Meds: . amiodarone  200 mg Oral Daily  . amLODipine  5 mg Oral Daily  . apixaban  5 mg Oral BID  . atorvastatin  20 mg Oral Daily  . escitalopram  10 mg Oral Daily  . ferrous sulfate  325 mg Oral BID WC  . folic acid  1 mg Oral Daily  . pantoprazole  40 mg Oral Daily  . tamsulosin  0.4 mg Oral QPM   Continuous Infusions: . methylPREDNISolone (SOLU-MEDROL) injection 500 mg (09/11/20 1802)   PRN Meds:.acetaminophen **OR** acetaminophen (TYLENOL) oral liquid 160 mg/5 mL **OR** acetaminophen, senna-docusate  Current Labs: reviewed    Physical Exam:  Blood pressure 130/61, pulse 82, temperature 97.7 F (36.5 C), temperature source Oral, resp. rate 18, height 6' (1.829 m), weight 114.6 kg, SpO2 98 %. 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 Solumedrol 500mg  IV x3 started 09/11/20 2. Hypertension: Improved on amlodipine 3. Anemia, continue to monitor, mild, asymptomatic  P . Completed 3-day pulse Solu-Medrol and then will transition to prednisone 60 mg daily . Await further information from nephro pathology . If evidence of acute GN, crescentic disease, likely would treat with rituximab . Daily weights, Daily Renal Panel, Strict I/Os, Avoid nephrotoxins (NSAIDs, judicious IV Contrast)    Pearson Grippe MD 09/12/2020, 9:26 AM  Recent Labs  Lab 09/09/20 1001 09/10/20 0418 09/11/20 0339 09/12/20 0159  NA 134* 136 138 136  K 3.5 3.8 3.7 3.4*  CL 104 103 106 107  CO2 21* 24 23 21*  GLUCOSE 156* 106* 94 162*  BUN 35* 33* 35* 36*  CREATININE 3.36* 3.51* 3.46* 3.24*  CALCIUM 7.9* 8.0* 7.9* 7.8*  PHOS 4.7*  --   --  4.1   Recent Labs  Lab 09/09/20 1001 09/10/20 0418 09/11/20 0339 09/12/20 0805  WBC 5.9 6.9 5.5 4.0  NEUTROABS 5.1 5.5 4.1  --   HGB 7.6* 7.9* 7.0* 8.0*  HCT 22.9* 23.5* 20.8* 24.0*  MCV 99.6 99.2 98.1 98.8  PLT 219 236 217 237

## 2020-09-13 DIAGNOSIS — N179 Acute kidney failure, unspecified: Secondary | ICD-10-CM | POA: Diagnosis not present

## 2020-09-13 DIAGNOSIS — I5032 Chronic diastolic (congestive) heart failure: Secondary | ICD-10-CM | POA: Diagnosis not present

## 2020-09-13 LAB — CBC
HCT: 21.6 % — ABNORMAL LOW (ref 39.0–52.0)
Hemoglobin: 7.3 g/dL — ABNORMAL LOW (ref 13.0–17.0)
MCH: 33.2 pg (ref 26.0–34.0)
MCHC: 33.8 g/dL (ref 30.0–36.0)
MCV: 98.2 fL (ref 80.0–100.0)
Platelets: 219 10*3/uL (ref 150–400)
RBC: 2.2 MIL/uL — ABNORMAL LOW (ref 4.22–5.81)
RDW: 14.6 % (ref 11.5–15.5)
WBC: 6.6 10*3/uL (ref 4.0–10.5)
nRBC: 0 % (ref 0.0–0.2)

## 2020-09-13 LAB — BASIC METABOLIC PANEL
Anion gap: 9 (ref 5–15)
BUN: 50 mg/dL — ABNORMAL HIGH (ref 8–23)
CO2: 23 mmol/L (ref 22–32)
Calcium: 8 mg/dL — ABNORMAL LOW (ref 8.9–10.3)
Chloride: 104 mmol/L (ref 98–111)
Creatinine, Ser: 3.5 mg/dL — ABNORMAL HIGH (ref 0.61–1.24)
GFR, Estimated: 17 mL/min — ABNORMAL LOW (ref >60)
Glucose, Bld: 156 mg/dL — ABNORMAL HIGH (ref 70–99)
Potassium: 3.7 mmol/L (ref 3.5–5.1)
Sodium: 136 mmol/L (ref 135–145)

## 2020-09-13 MED ORDER — AMLODIPINE BESYLATE 5 MG PO TABS: 5.0000 mg | ORAL_TABLET | Freq: Every day | ORAL | 0 refills | Status: DC

## 2020-09-13 MED ORDER — PREDNISONE 20 MG PO TABS: 60.0000 mg | ORAL_TABLET | Freq: Every day | ORAL | 0 refills | Status: DC

## 2020-09-13 NOTE — Discharge Summary (Signed)
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 studies Performing Technologist: Jody Hill RVT, RDMS  Examination Guidelines: A complete evaluation includes B-mode imaging, spectral Doppler, color Doppler, and power Doppler as needed of all accessible portions of each vessel. Bilateral testing is considered an integral part of a complete examination. Limited examinations for reoccurring indications may be performed as noted.  Right Carotid Findings: +----------+--------+--------+--------+------------------+-------------------+           PSV cm/sEDV cm/sStenosisPlaque DescriptionComments            +----------+--------+--------+--------+------------------+-------------------+ CCA Prox  62      9                                                     +----------+--------+--------+--------+------------------+-------------------+  CCA Distal50      10                                                     +----------+--------+--------+--------+------------------+-------------------+ ICA Prox  54      11                                                    +----------+--------+--------+--------+------------------+-------------------+ ICA Distal58      15                                Not well visualized +----------+--------+--------+--------+------------------+-------------------+ ECA       65      0                                                     +----------+--------+--------+--------+------------------+-------------------+ +----------+--------+-------+----------------+-------------------+           PSV cm/sEDV cmsDescribe        Arm Pressure (mmHG) +----------+--------+-------+----------------+-------------------+ Subclavian140            Multiphasic, WNL                    +----------+--------+-------+----------------+-------------------+ +---------+--------+--+--------+-+---------+ VertebralPSV cm/s29EDV cm/s6Antegrade +---------+--------+--+--------+-+---------+  Left Carotid Findings: +----------+--------+--------+--------+--------------------+-------------------+           PSV cm/sEDV cm/sStenosisPlaque Description  Comments            +----------+--------+--------+--------+--------------------+-------------------+ CCA Prox  76      0                                   intimal thickening  +----------+--------+--------+--------+--------------------+-------------------+ CCA Distal58      0               heterogenous and    intimal thickening                                    smooth                                  +----------+--------+--------+--------+--------------------+-------------------+ ICA Prox  45      11      1-39%   calcific,                                                                 heterogenous and  smooth                                   +----------+--------+--------+--------+--------------------+-------------------+ ICA Distal62      14                                  Not well visualized +----------+--------+--------+--------+--------------------+-------------------+ ECA       68      0                                                       +----------+--------+--------+--------+--------------------+-------------------+ +----------+--------+--------+----------------+-------------------+           PSV cm/sEDV cm/sDescribe        Arm Pressure (mmHG) +----------+--------+--------+----------------+-------------------+ [id number]             Multiphasic, WNL                    +----------+--------+--------+----------------+-------------------+ +---------+--------+--------+--------------+ VertebralPSV cm/sEDV cm/sNot identified +---------+--------+--------+--------------+   Summary: Right Carotid: The extracranial vessels were near-normal with only minimal wall                thickening or plaque. Left Carotid: Velocities in the left ICA are consistent with a 1-39% stenosis.               Non-hemodynamically significant plaque <50% noted in the CCA. The               ECA appears <50% stenosed. Vertebrals:  Right vertebral artery demonstrates antegrade flow. Left vertebral              artery was not visualized. Subclavians: Normal flow hemodynamics were seen in bilateral subclavian              arteries. *See table(s) above for measurements and observations.  Electronically signed by Curt Jews MD on 09/08/2020 at 8:19:14 PM.    Final       Subjective: - no chest pain, shortness of breath, no abdominal pain, nausea or vomiting.   Discharge Exam: BP (!) 152/80 (BP Location: Right Arm)   Pulse 79   Temp 98.6 F (37 C) (Oral)   Resp 18   Ht 6' (1.829 m)   Wt 109.1 kg   SpO2 97%   BMI 32.62 kg/m   General: Pt is alert, awake, not in acute distress Cardiovascular: RRR, S1/S2 +, no rubs, no  gallops Respiratory: CTA bilaterally, no wheezing, no rhonchi Abdominal: Soft, NT, ND, bowel sounds + Extremities: no edema, no cyanosis   The results of significant diagnostics from this hospitalization (including imaging, microbiology, ancillary and laboratory) are listed below for reference.     Microbiology: Recent Results (from the past 240 hour(s))  Resp Panel by RT-PCR (Flu A&B, Covid) Nasopharyngeal Swab     Status: None   Collection Time: 09/07/20  5:54 PM   Specimen: Nasopharyngeal Swab; Nasopharyngeal(NP) swabs in vial transport medium  Result Value Ref Range Status   SARS Coronavirus 2 by RT PCR NEGATIVE NEGATIVE Final    Comment: (NOTE) SARS-CoV-2 target nucleic acids are NOT DETECTED.  The SARS-CoV-2 RNA is generally detectable in upper respiratory specimens during the acute phase of infection. The lowest concentration of SARS-CoV-2  viral copies this assay can detect is 138 copies/mL. A negative result does not preclude SARS-Cov-2 infection and should not be used as the sole basis for treatment or other patient management decisions. A negative result may occur with  improper specimen collection/handling, submission of specimen other than nasopharyngeal swab, presence of viral mutation(s) within the areas targeted by this assay, and inadequate number of viral copies(<138 copies/mL). A negative result must be combined with clinical observations, patient history, and epidemiological information. The expected result is Negative.  Fact Sheet for Patients:  EntrepreneurPulse.com.au  Fact Sheet for Healthcare Providers:  IncredibleEmployment.be  This test is no t yet approved or cleared by the [place] FDA and  has been authorized for detection and/or diagnosis of SARS-CoV-2 by FDA under an Emergency Use Authorization (EUA). This EUA will remain  in effect (meaning this test can be used) for the duration of the COVID-19  declaration under Section 564(b)(1) of the Act, 21 U.S.C.section 360bbb-3(b)(1), unless the authorization is terminated  or revoked sooner.       Influenza A by PCR NEGATIVE NEGATIVE Final   Influenza B by PCR NEGATIVE NEGATIVE Final    Comment: (NOTE) The Xpert Xpress SARS-CoV-2/FLU/RSV plus assay is intended as an aid in the diagnosis of influenza from Nasopharyngeal swab specimens and should not be used as a sole basis for treatment. Nasal washings and aspirates are unacceptable for Xpert Xpress SARS-CoV-2/FLU/RSV testing.  Fact Sheet for Patients: EntrepreneurPulse.com.au  Fact Sheet for Healthcare Providers: IncredibleEmployment.be  This test is not yet approved or cleared by the [place] FDA and has been authorized for detection and/or diagnosis of SARS-CoV-2 by FDA under an Emergency Use Authorization (EUA). This EUA will remain in effect (meaning this test can be used) for the duration of the COVID-19 declaration under Section 564(b)(1) of the Act, 21 U.S.C. section 360bbb-3(b)(1), unless the authorization is terminated or revoked.  Performed at [facility], [place] [address] [place], [place] [address]   Culture, Urine     Status: None   Collection Time: 09/08/20  6:18 PM   Specimen: Urine, Catheterized  Result Value Ref Range Status   Specimen Description URINE, CATHETERIZED  Final   Special Requests NONE  Final   Culture   Final    NO GROWTH Performed at [place] [address] [place], [place] [address]    Report Status 09/09/2020 FINAL  Final     Labs: Basic Metabolic Panel: Recent Labs  Lab 09/09/20 1001 09/10/20 0418 09/11/20 0339 09/12/20 0159 09/13/20 0252  NA 134* 136 138 136 136  K 3.5 3.8 3.7 3.4* 3.7  CL 104 103 106 107 104  CO2 21* 24 23 21* 23  GLUCOSE 156* 106* 94 162* 156*  BUN 35* 33* 35* 36* 50*  CREATININE 3.36* 3.51* 3.46* 3.24* 3.50*  CALCIUM 7.9* 8.0* 7.9* 7.8* 8.0*  MG  1.8  --   --   --   --   PHOS 4.7*  --   --  4.1  --    Liver Function Tests: Recent Labs  Lab 09/07/20 1656 09/12/20 0159  AST 19  --   ALT 11  --   ALKPHOS 106  --   BILITOT 1.2  --   PROT 5.2*  --   ALBUMIN 2.4* 2.2*   CBC: Recent Labs  Lab 09/07/20 1656 09/08/20 0510 09/09/20 1001 09/10/20 0418 09/11/20 0339 09/12/20 0805 09/13/20 0252  WBC 7.4   < > 5.9 6.9  5.5 4.0 6.6  NEUTROABS 5.9  --  5.1 5.5 4.1  --   --   HGB 8.0*   < > 7.6* 7.9* 7.0* 8.0* 7.3*  HCT 23.7*   < > 22.9* 23.5* 20.8* 24.0* 21.6*  MCV 98.3   < > 99.6 99.2 98.1 98.8 98.2  PLT 230   < > 219 236 217 237 219   < > = values in this interval not displayed.   CBG: No results for input(s): GLUCAP in the last 168 hours. Hgb A1c No results for input(s): HGBA1C in the last 72 hours. Lipid Profile No results for input(s): CHOL, HDL, LDLCALC, TRIG, CHOLHDL, LDLDIRECT in the last 72 hours. Thyroid function studies No results for input(s): TSH, T4TOTAL, T3FREE, THYROIDAB in the last 72 hours.  Invalid input(s): FREET3 Urinalysis    Component Value Date/Time   COLORURINE YELLOW 09/08/2020 1818   APPEARANCEUR HAZY (A) 09/08/2020 1818   LABSPEC 1.005 09/08/2020 1818   PHURINE 6.0 09/08/2020 1818   GLUCOSEU NEGATIVE 09/08/2020 1818   HGBUR LARGE (A) 09/08/2020 1818   BILIRUBINUR NEGATIVE 09/08/2020 1818   KETONESUR NEGATIVE 09/08/2020 1818   PROTEINUR 100 (A) 09/08/2020 1818   NITRITE NEGATIVE 09/08/2020 1818   LEUKOCYTESUR NEGATIVE 09/08/2020 1818    FURTHER DISCHARGE INSTRUCTIONS:   Get Medicines reviewed and adjusted: Please take all your medications with you for your next visit with your Primary MD   Laboratory/radiological data: Please request your Primary MD to go over all hospital tests and procedure/radiological results at the follow up, please ask your Primary MD to get all Hospital records sent to his/her office.   In some cases, they will be blood work, cultures and biopsy results  pending at the time of your discharge. Please request that your primary care M.D. goes through all the records of your hospital data and follows up on these results.   Also Note the following: If you experience worsening of your admission symptoms, develop shortness of breath, life threatening emergency, suicidal or homicidal thoughts you must seek medical attention immediately by calling 911 or calling your MD immediately  if symptoms less severe.   You must read complete instructions/literature along with all the possible adverse reactions/side effects for all the Medicines you take and that have been prescribed to you. Take any new Medicines after you have completely understood and accpet all the possible adverse reactions/side effects.    Do not drive when taking Pain medications or sleeping medications (Benzodaizepines)   Do not take more than prescribed Pain, Sleep and Anxiety Medications. It is not advisable to combine anxiety,sleep and pain medications without talking with your primary care practitioner   Special Instructions: If you have smoked or chewed Tobacco  in the last 2 yrs please stop smoking, stop any regular Alcohol  and or any Recreational drug use.   Wear Seat belts while driving.   Please note: You were cared for by a hospitalist during your hospital stay. Once you are discharged, your primary care physician will handle any further medical issues. Please note that NO REFILLS for any discharge medications will be authorized once you are discharged, as it is imperative that you return to your primary care physician (or establish a relationship with a primary care physician if you do not have one) for your post hospital discharge needs so that they can reassess your need for medications and monitor your lab values.  Time coordinating discharge: 40 minutes  SIGNED:  Marzetta Board, MD, PhD 09/13/2020,  11:43 AM

## 2020-09-13 NOTE — Plan of Care (Signed)
  Problem: Education: Goal: Knowledge of secondary prevention will improve Outcome: Adequate for Discharge Goal: Knowledge of patient specific risk factors addressed and post discharge goals established will improve Outcome: Adequate for Discharge   Problem: Education: Goal: Knowledge of General Education information will improve Description: Including pain rating scale, medication(s)/side effects and non-pharmacologic comfort measures Outcome: Adequate for Discharge   Problem: Health Behavior/Discharge Planning: Goal: Ability to manage health-related needs will improve Outcome: Adequate for Discharge   Problem: Clinical Measurements: Goal: Ability to maintain clinical measurements within normal limits will improve Outcome: Adequate for Discharge Goal: Will remain free from infection Outcome: Adequate for Discharge Goal: Diagnostic test results will improve Outcome: Adequate for Discharge Goal: Respiratory complications will improve Outcome: Adequate for Discharge Goal: Cardiovascular complication will be avoided Outcome: Adequate for Discharge   Problem: Activity: Goal: Risk for activity intolerance will decrease Outcome: Adequate for Discharge   Problem: Nutrition: Goal: Adequate nutrition will be maintained Outcome: Adequate for Discharge   Problem: Coping: Goal: Level of anxiety will decrease Outcome: Adequate for Discharge   Problem: Elimination: Goal: Will not experience complications related to bowel motility Outcome: Adequate for Discharge Goal: Will not experience complications related to urinary retention Outcome: Adequate for Discharge   Problem: Pain Managment: Goal: General experience of comfort will improve Outcome: Adequate for Discharge   Problem: Safety: Goal: Ability to remain free from injury will improve Outcome: Adequate for Discharge   Problem: Skin Integrity: Goal: Risk for impaired skin integrity will decrease Outcome: Adequate for  Discharge

## 2020-09-13 NOTE — TOC Transition Note (Signed)
Transition of Care [facility]) - CM/SW Discharge Note   Patient Details  Name: Darryl Diaz MRN: [id number] Date of Birth: 1940/06/25  [facility]) CM/SW Contact:  Carles Collet, RN Phone Number: 09/13/2020, 2:00 PM   Clinical Narrative:    Paulino Rily of [place]. No other CM needs identified      Barriers to Discharge: Continued Medical Work up   Patient Goals and CMS Choice   CMS Medicare.gov Compare Post Acute Care list provided to:: Patient Choice offered to / list presented to : Patient  Discharge Placement                       Discharge Plan and Services   Discharge Planning Services: CM Consult Post Acute Care Choice: [place]: PT,OT,Nurse's Aide [place]: [place] Date [place]: 09/08/20   Representative spoke with at [place]: [place] ([place]) Interventions     Readmission Risk Interventions Readmission Risk Prevention Plan 03/18/2020 03/05/2020 04/23/2019  Transportation Screening Complete Complete Complete  PCP or Specialist Appt within 5-7 Days - - Complete  Home Care Screening - - Complete  Medication Review (RN CM) - - Complete  HRI or Home Care Consult Complete Complete -  Social Work Consult for [place] Planning/Counseling Complete Complete -  Palliative Care Screening Not Applicable Not Applicable -  Medication Review Press photographer) Complete Complete -  Some recent data might be hidden

## 2020-09-13 NOTE — Progress Notes (Deleted)
PROGRESS NOTE  Darryl Diaz HEN:[id number] DOB: Oct 04, 1940 DOA: 09/07/2020 PCP: [name], MD   LOS: 5 days   Brief Narrative / Interim history: 80 year old male with paroxysmal A. fib on Eliquis, tachybradycardia syndrome, chronic diastolic CHF, orthostatic hypotension, hyperlipidemia, iron deficiency anemia, chronic bilateral lower extremity edema, depression came into the hospital with weakness.  He was admitted on 5/2, she was also found to have acute kidney injury and hyponatremia.  His weakness was mainly on the left lower extremity, an MRI of the brain was negative for acute CVA.  Subjective / 24h Interval events: He is feeling slightly congested this morning.  No significant shortness of breath.  No fever or chills.  Assessment & Plan: Principal Problem Acute kidney injury, microhematuria-patient's prior creatinine was normal in February 2022, progressively worsening over the last couple months.  Nephrology following.  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  More stains to be available tomorrow.  He has been started on steroids, today is day 3 of pulse steroids. -Creatinine overall stable but slightly worse today at 3.5.  BUN 50.  Defer to nephrology diuretics given slight increase in reported congestion -Urology consulted with recommended outpatient follow-up, hematuria now resolved  Active Problems TIA, left lower extremity weakness-MRI brain without contrast was negative for acute finding. MRA head was negative for large vessel occlusion or significant proximal stenosis. Echocardiogram with EF 60 to 65%, no cardiac source of embolism. Carotid duplex did not show any significant stenosis. A1c 4.4, HDL 55, LDL 46. Continue Eliquisper neurology recommendation. PT/OT/SLP eval obtained.Home health PT recommended.  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 Patientcomplained ofprogressively worsening dyspnea on exertionfor the last few weeks. He was seen by cardiologist Dr. Burt Knack on 3/28 and was placed on scheduled Lasix as an outpatient.  He was given Lasix x1 here on admission with improvement.  Further diuretics management per nephrology  Anemia, of chronic disease-hemoglobin slightly lower at 7.3 this morning, no bleeding, continue to monitor.  Hypertension-continue amlodipine  History of orthostatic hypotension-Home midodrine is on hold, currently hypotensive  Hyponatremia-hyperkalemia, resolved  Paroxysmal A. fib, tachybradycardia syndrome -continue amiodarone, currently in sinus rhythm.  Continue Eliquis  Hyperlipidemia-continue statin  Depression/anxiety-continue Lexapro  BPH-continue Flomax  Scheduled Meds: . amiodarone  200 mg Oral Daily  . amLODipine  5 mg Oral Daily  . apixaban  5 mg Oral BID  . atorvastatin  20 mg Oral Daily  . escitalopram  10 mg Oral Daily  . ferrous sulfate  325 mg Oral BID WC  . folic acid  1 mg Oral Daily  . pantoprazole  40 mg Oral Daily  . tamsulosin  0.4 mg Oral QPM   Continuous Infusions: . methylPREDNISolone (SOLU-MEDROL) injection 500 mg (09/12/20 1650)   PRN Meds:.acetaminophen **OR** acetaminophen (TYLENOL) oral liquid 160 mg/5 mL **OR** acetaminophen, senna-docusate  Diet Orders (From admission, onward)    Start     Ordered   09/10/20 1300  Diet renal with fluid restriction Fluid restriction: 1200 mL Fluid; Room service appropriate? Yes; Fluid consistency: Thin  Diet effective now       Comments: Can start when passes stroke swallow screen.  Question Answer Comment  Fluid restriction: 1200 mL Fluid   Room service appropriate? Yes   Fluid consistency: Thin      09/10/20 1300  DVT prophylaxis:  apixaban (ELIQUIS) tablet 5 mg     Code Status: Full Code  Family Communication: no family at  bedside   Status is: Inpatient  Remains inpatient appropriate because:Inpatient level of care appropriate due to severity of illness   Dispo: The patient is from: Home              Anticipated d/c is to: Home              Patient currently is not medically stable to d/c.   Difficult to place patient No  Level of care: Telemetry Medical  Consultants:  Neurology Cardiology Urology Nephrology  Procedures:  2D echo  Microbiology  none  Antimicrobials: none    Objective: Vitals:   09/12/20 2300 09/13/20 0407 09/13/20 0500 09/13/20 0743  BP: (!) 118/49 (!) 143/68  (!) 152/80  Pulse: 73 81  79  Resp: 18 20  18   Temp: 98.2 F (36.8 C) 98.3 F (36.8 C)  98.6 F (37 C)  TempSrc: Oral Oral  Oral  SpO2: 94% 94%  97%  Weight:   109.1 kg   Height:        Intake/Output Summary (Last 24 hours) at 09/13/2020 0946 Last data filed at 09/13/2020 0800 Gross per 24 hour  Intake 480 ml  Output 880 ml  Net -400 ml   Filed Weights   09/08/20 0830 09/12/20 1704 09/13/20 0500  Weight: 114.6 kg 108.9 kg 109.1 kg    Examination:  Constitutional: No distress Eyes: No icterus ENMT: Mucous membranes are moist.  Neck: normal, supple Respiratory: Bilaterally, no wheezing, no crackles Cardiovascular: Heart is regular, no murmurs.  1+ lower extremity edema Abdomen: Soft, NT, ND, bowel sounds positive Musculoskeletal: no clubbing / cyanosis.  Skin: No rashes, Neurologic: No focal deficits  Data Reviewed: I have independently reviewed following labs and imaging studies   CBC: Recent Labs  Lab 09/07/20 1656 09/08/20 0510 09/09/20 1001 09/10/20 0418 09/11/20 0339 09/12/20 0805 09/13/20 0252  WBC 7.4   < > 5.9 6.9 5.5 4.0 6.6  NEUTROABS 5.9  --  5.1 5.5 4.1  --   --   HGB 8.0*   < > 7.6* 7.9* 7.0* 8.0* 7.3*  HCT 23.7*   < > 22.9* 23.5* 20.8* 24.0* 21.6*  MCV 98.3   < > 99.6 99.2 98.1 98.8 98.2  PLT 230   < > 219 236 217 237 219   < > = values in this interval not  displayed.   Basic Metabolic Panel: Recent Labs  Lab 09/09/20 1001 09/10/20 0418 09/11/20 0339 09/12/20 0159 09/13/20 0252  NA 134* 136 138 136 136  K 3.5 3.8 3.7 3.4* 3.7  CL 104 103 106 107 104  CO2 21* 24 23 21* 23  GLUCOSE 156* 106* 94 162* 156*  BUN 35* 33* 35* 36* 50*  CREATININE 3.36* 3.51* 3.46* 3.24* 3.50*  CALCIUM 7.9* 8.0* 7.9* 7.8* 8.0*  MG 1.8  --   --   --   --   PHOS 4.7*  --   --  4.1  --    Liver Function Tests: Recent Labs  Lab 09/07/20 1656 09/12/20 0159  AST 19  --   ALT 11  --   ALKPHOS 106  --   BILITOT 1.2  --   PROT 5.2*  --   ALBUMIN 2.4* 2.2*   Coagulation Profile: Recent Labs  Lab 09/07/20 1656  INR 1.4*   HbA1C: No results for input(s): HGBA1C in  the last 72 hours. CBG: No results for input(s): GLUCAP in the last 168 hours.  Recent Results (from the past 240 hour(s))  Resp Panel by RT-PCR (Flu A&B, Covid) Nasopharyngeal Swab     Status: None   Collection Time: 09/07/20  5:54 PM   Specimen: Nasopharyngeal Swab; Nasopharyngeal(NP) swabs in vial transport medium  Result Value Ref Range Status   SARS Coronavirus 2 by RT PCR NEGATIVE NEGATIVE Final    Comment: (NOTE) SARS-CoV-2 target nucleic acids are NOT DETECTED.  The SARS-CoV-2 RNA is generally detectable in upper respiratory specimens during the acute phase of infection. The lowest concentration of SARS-CoV-2 viral copies this assay can detect is 138 copies/mL. A negative result does not preclude SARS-Cov-2 infection and should not be used as the sole basis for treatment or other patient management decisions. A negative result may occur with  improper specimen collection/handling, submission of specimen other than nasopharyngeal swab, presence of viral mutation(s) within the areas targeted by this assay, and inadequate number of viral copies(<138 copies/mL). A negative result must be combined with clinical observations, patient history, and epidemiological information. The  expected result is Negative.  Fact Sheet for Patients:  EntrepreneurPulse.com.au  Fact Sheet for Healthcare Providers:  IncredibleEmployment.be  This test is no t yet approved or cleared by the [place] FDA and  has been authorized for detection and/or diagnosis of SARS-CoV-2 by FDA under an Emergency Use Authorization (EUA). This EUA will remain  in effect (meaning this test can be used) for the duration of the COVID-19 declaration under Section 564(b)(1) of the Act, 21 U.S.C.section 360bbb-3(b)(1), unless the authorization is terminated  or revoked sooner.       Influenza A by PCR NEGATIVE NEGATIVE Final   Influenza B by PCR NEGATIVE NEGATIVE Final    Comment: (NOTE) The Xpert Xpress SARS-CoV-2/FLU/RSV plus assay is intended as an aid in the diagnosis of influenza from Nasopharyngeal swab specimens and should not be used as a sole basis for treatment. Nasal washings and aspirates are unacceptable for Xpert Xpress SARS-CoV-2/FLU/RSV testing.  Fact Sheet for Patients: EntrepreneurPulse.com.au  Fact Sheet for Healthcare Providers: IncredibleEmployment.be  This test is not yet approved or cleared by the [place] FDA and has been authorized for detection and/or diagnosis of SARS-CoV-2 by FDA under an Emergency Use Authorization (EUA). This EUA will remain in effect (meaning this test can be used) for the duration of the COVID-19 declaration under Section 564(b)(1) of the Act, 21 U.S.C. section 360bbb-3(b)(1), unless the authorization is terminated or revoked.  Performed at [facility], [place] [address], [place], [place] [address]   Culture, Urine     Status: None   Collection Time: 09/08/20  6:18 PM   Specimen: Urine, Catheterized  Result Value Ref Range Status   Specimen Description URINE, CATHETERIZED  Final   Special Requests NONE  Final   Culture   Final    NO GROWTH Performed at  [facility] [address] [place], [place] [address]    Report Status 09/09/2020 FINAL  Final     Radiology Studies: No results found.  Marzetta Board, MD, PhD [facility]  Between 7 am - 7 pm I am available, please contact me via Amion (for emergencies) or Securechat (non urgent messages)  Between 7 pm - 7 am I am not available, please contact night coverage MD/APP via Amion

## 2020-09-13 NOTE — Progress Notes (Signed)
Admit: 09/07/2020 LOS: 5  19M with rapid decline in GFR, tea colored hematuria, nephrotic range proteinuria  Subjective:  . No overnight events . Today is day 3 of Solu-Medrol pulse . K3.7, Creatinine up little bit, probably best described as stable . 0.7 L UOP  05/07 0701 - 05/08 0700 In: 390 [P.O.:390] Out: 680 [Urine:680]  Filed Weights   09/08/20 0830 09/12/20 1704 09/13/20 0500  Weight: 114.6 kg 108.9 kg 109.1 kg    Scheduled Meds: . amiodarone  200 mg Oral Daily  . amLODipine  5 mg Oral Daily  . apixaban  5 mg Oral BID  . atorvastatin  20 mg Oral Daily  . escitalopram  10 mg Oral Daily  . ferrous sulfate  325 mg Oral BID WC  . folic acid  1 mg Oral Daily  . pantoprazole  40 mg Oral Daily  . tamsulosin  0.4 mg Oral QPM   Continuous Infusions: . methylPREDNISolone (SOLU-MEDROL) injection 500 mg (09/12/20 1650)   PRN Meds:.acetaminophen **OR** acetaminophen (TYLENOL) oral liquid 160 mg/5 mL **OR** acetaminophen, senna-docusate  Current Labs: reviewed    Physical Exam:  Blood pressure (!) 152/80, pulse 79, temperature 98.6 F (37 C), temperature source Oral, resp. rate 18, height 6' (1.829 m), weight 109.1 kg, SpO2 97 %. 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 Solumedrol 500mg  IV x3 started 09/11/20 2. Hypertension: Improved on amlodipine 3. Anemia, continue to monitor, mild, asymptomatic  P . Completed 3-day pulse Solu-Medrol and then will transition to prednisone  60 mg daily, starting 5/9 . Await further information from nephro pathology . If evidence of acute GN, crescentic disease, likely would treat with rituximab . No further inpatient renal issues, will arrange very close follow-up in our office, okay for discharge today after third dose of Solu-Medrol . Need for PCP prophylaxis until initiation of second immunosuppressant, will address as outpatient . Daily weights, Daily Renal Panel, Strict I/Os, Avoid nephrotoxins (NSAIDs, judicious IV Contrast)   Will sign off for now, follow closely from the office.  Pearson Grippe MD 09/13/2020, 11:21 AM  Recent Labs  Lab 09/09/20 1001 09/10/20 0418 09/11/20 0339 09/12/20 0159 09/13/20 0252  NA 134*   < > 138 136 136  K 3.5   < > 3.7 3.4* 3.7  CL 104   < > 106 107 104  CO2 21*   < > 23 21* 23  GLUCOSE 156*   < > 94 162* 156*  BUN 35*   < > 35* 36* 50*  CREATININE 3.36*   < > 3.46* 3.24* 3.50*  CALCIUM 7.9*   < > 7.9* 7.8* 8.0*  PHOS 4.7*  --   --  4.1  --    < > = values in this interval not displayed.   Recent Labs  Lab 09/09/20 1001 09/10/20 0418 09/11/20 0339 09/12/20 0805 09/13/20 0252  WBC 5.9 6.9 5.5 4.0 6.6  NEUTROABS 5.1 5.5 4.1  --   --   HGB 7.6* 7.9* 7.0* 8.0* 7.3*  HCT 22.9* 23.5* 20.8* 24.0* 21.6*  MCV 99.6 99.2 98.1 98.8 98.2  PLT 219 236 217 237 219

## 2020-09-15 DIAGNOSIS — I050 Rheumatic mitral stenosis: Secondary | ICD-10-CM | POA: Diagnosis not present

## 2020-09-15 DIAGNOSIS — G459 Transient cerebral ischemic attack, unspecified: Secondary | ICD-10-CM | POA: Diagnosis not present

## 2020-09-15 DIAGNOSIS — M47815 Spondylosis without myelopathy or radiculopathy, thoracolumbar region: Secondary | ICD-10-CM | POA: Diagnosis not present

## 2020-09-15 DIAGNOSIS — G319 Degenerative disease of nervous system, unspecified: Secondary | ICD-10-CM | POA: Diagnosis not present

## 2020-09-15 DIAGNOSIS — K429 Umbilical hernia without obstruction or gangrene: Secondary | ICD-10-CM | POA: Diagnosis not present

## 2020-09-15 DIAGNOSIS — D509 Iron deficiency anemia, unspecified: Secondary | ICD-10-CM | POA: Diagnosis not present

## 2020-09-15 DIAGNOSIS — M47816 Spondylosis without myelopathy or radiculopathy, lumbar region: Secondary | ICD-10-CM | POA: Diagnosis not present

## 2020-09-15 DIAGNOSIS — J9811 Atelectasis: Secondary | ICD-10-CM | POA: Diagnosis not present

## 2020-09-15 DIAGNOSIS — I5033 Acute on chronic diastolic (congestive) heart failure: Secondary | ICD-10-CM | POA: Diagnosis not present

## 2020-09-15 DIAGNOSIS — N400 Enlarged prostate without lower urinary tract symptoms: Secondary | ICD-10-CM | POA: Diagnosis not present

## 2020-09-15 DIAGNOSIS — K760 Fatty (change of) liver, not elsewhere classified: Secondary | ICD-10-CM | POA: Diagnosis not present

## 2020-09-15 DIAGNOSIS — N179 Acute kidney failure, unspecified: Secondary | ICD-10-CM | POA: Diagnosis not present

## 2020-09-15 DIAGNOSIS — K5730 Diverticulosis of large intestine without perforation or abscess without bleeding: Secondary | ICD-10-CM | POA: Diagnosis not present

## 2020-09-15 DIAGNOSIS — G4733 Obstructive sleep apnea (adult) (pediatric): Secondary | ICD-10-CM | POA: Diagnosis not present

## 2020-09-15 DIAGNOSIS — F419 Anxiety disorder, unspecified: Secondary | ICD-10-CM | POA: Diagnosis not present

## 2020-09-15 DIAGNOSIS — M19041 Primary osteoarthritis, right hand: Secondary | ICD-10-CM | POA: Diagnosis not present

## 2020-09-15 DIAGNOSIS — K8020 Calculus of gallbladder without cholecystitis without obstruction: Secondary | ICD-10-CM | POA: Diagnosis not present

## 2020-09-15 DIAGNOSIS — I495 Sick sinus syndrome: Secondary | ICD-10-CM | POA: Diagnosis not present

## 2020-09-15 DIAGNOSIS — I480 Paroxysmal atrial fibrillation: Secondary | ICD-10-CM | POA: Diagnosis not present

## 2020-09-15 DIAGNOSIS — M19042 Primary osteoarthritis, left hand: Secondary | ICD-10-CM | POA: Diagnosis not present

## 2020-09-15 DIAGNOSIS — F329 Major depressive disorder, single episode, unspecified: Secondary | ICD-10-CM | POA: Diagnosis not present

## 2020-09-15 DIAGNOSIS — G4700 Insomnia, unspecified: Secondary | ICD-10-CM | POA: Diagnosis not present

## 2020-09-15 DIAGNOSIS — N281 Cyst of kidney, acquired: Secondary | ICD-10-CM | POA: Diagnosis not present

## 2020-09-15 DIAGNOSIS — I110 Hypertensive heart disease with heart failure: Secondary | ICD-10-CM | POA: Diagnosis not present

## 2020-09-15 DIAGNOSIS — K7460 Unspecified cirrhosis of liver: Secondary | ICD-10-CM | POA: Diagnosis not present

## 2020-09-17 DIAGNOSIS — I776 Arteritis, unspecified: Secondary | ICD-10-CM | POA: Diagnosis not present

## 2020-09-24 LAB — SURGICAL PATHOLOGY

## 2020-09-25 ENCOUNTER — Encounter (HOSPITAL_COMMUNITY): Payer: Self-pay

## 2020-09-25 DIAGNOSIS — I5033 Acute on chronic diastolic (congestive) heart failure: Secondary | ICD-10-CM | POA: Diagnosis not present

## 2020-09-25 DIAGNOSIS — I110 Hypertensive heart disease with heart failure: Secondary | ICD-10-CM | POA: Diagnosis not present

## 2020-09-25 DIAGNOSIS — M47816 Spondylosis without myelopathy or radiculopathy, lumbar region: Secondary | ICD-10-CM | POA: Diagnosis not present

## 2020-09-25 DIAGNOSIS — G459 Transient cerebral ischemic attack, unspecified: Secondary | ICD-10-CM | POA: Diagnosis not present

## 2020-09-25 DIAGNOSIS — M47815 Spondylosis without myelopathy or radiculopathy, thoracolumbar region: Secondary | ICD-10-CM | POA: Diagnosis not present

## 2020-09-25 DIAGNOSIS — N179 Acute kidney failure, unspecified: Secondary | ICD-10-CM | POA: Diagnosis not present

## 2020-09-26 ENCOUNTER — Emergency Department (HOSPITAL_COMMUNITY)
Admission: EM | Admit: 2020-09-26 | Discharge: 2020-09-26 | Disposition: A | Discharge status: Home / Self Care | Payer: Medicare Other | Attending: Emergency Medicine | Admitting: Emergency Medicine

## 2020-09-26 ENCOUNTER — Other Ambulatory Visit: Payer: Self-pay

## 2020-09-26 DIAGNOSIS — S99922A Unspecified injury of left foot, initial encounter: Secondary | ICD-10-CM | POA: Diagnosis present

## 2020-09-26 DIAGNOSIS — S91312A Laceration without foreign body, left foot, initial encounter: Secondary | ICD-10-CM | POA: Insufficient documentation

## 2020-09-26 DIAGNOSIS — R5381 Other malaise: Secondary | ICD-10-CM | POA: Diagnosis not present

## 2020-09-26 DIAGNOSIS — I4891 Unspecified atrial fibrillation: Secondary | ICD-10-CM | POA: Insufficient documentation

## 2020-09-26 DIAGNOSIS — Z7901 Long term (current) use of anticoagulants: Secondary | ICD-10-CM | POA: Diagnosis not present

## 2020-09-26 DIAGNOSIS — R600 Localized edema: Secondary | ICD-10-CM | POA: Insufficient documentation

## 2020-09-26 DIAGNOSIS — I110 Hypertensive heart disease with heart failure: Secondary | ICD-10-CM | POA: Diagnosis not present

## 2020-09-26 DIAGNOSIS — W208XXA Other cause of strike by thrown, projected or falling object, initial encounter: Secondary | ICD-10-CM | POA: Diagnosis not present

## 2020-09-26 DIAGNOSIS — Z79899 Other long term (current) drug therapy: Secondary | ICD-10-CM | POA: Insufficient documentation

## 2020-09-26 DIAGNOSIS — I5032 Chronic diastolic (congestive) heart failure: Secondary | ICD-10-CM | POA: Diagnosis not present

## 2020-09-26 DIAGNOSIS — Z96643 Presence of artificial hip joint, bilateral: Secondary | ICD-10-CM | POA: Insufficient documentation

## 2020-09-26 DIAGNOSIS — R69 Illness, unspecified: Secondary | ICD-10-CM | POA: Diagnosis not present

## 2020-09-26 NOTE — ED Triage Notes (Signed)
Patient BIB [facility] after dropping a stone bird statue on his L foot suffering a laceration to the mid foot on the top about 2 inches long. Pt is on Eliquis for A fib but bleeding controlled at this time with bandages applied. Pt has distal PMS intact. Pt VSS at this time.

## 2020-09-26 NOTE — ED Notes (Signed)
Pt verbalizes understanding of discharge instructions. Opportunity for questions and answers were provided. Armband removed by staff, pt discharged from the ED.  

## 2020-09-26 NOTE — Discharge Instructions (Addendum)
Your stitches (4 of them) need to be removed in 1-2 weeks by your primary care office.  Keep your foot dry and bandaged for 24 hours.  Afterwards you can shower normally.  Your wound will likely weep some water from the fluid in your leg.  Try to put a fresh gauze pad right over the wound every day.

## 2020-09-26 NOTE — ED Provider Notes (Signed)
[facility] EMERGENCY DEPARTMENT Provider Note   CSN: [id number] Arrival date & time: 09/26/20  6503     History Chief Complaint  Patient presents with  . Laceration    Darryl Diaz is a 80 y.o. male with history of A. fib on Eliquis presenting emergency department with a laceration to his left foot.  He reports he dropped a small statue on his left foot this morning.  He had a laceration on the top of the midfoot.  It was bleeding at the time of the bleed has been under control bandages.  He does have significant edema of the lower extremities and takes Lasix for that, and had some water weepage as well.  He says he was able to bear weight denies any pain in his foot at this time.  He does live by himself but has relatives who live close by.  HPI     Past Medical History:  Diagnosis Date  . 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 Anemia   . 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 Bradycardia    low 20's  . Bradycardic cardiac arrest ([place])   . Cardiac arrest ([place]) 04/20/2019   12 min CPR with epi  . Chronic diastolic CHF (congestive heart failure) ([place])   . Fall at home 03/28/2016  . Fatty liver   . Gastritis   . GERD (gastroesophageal reflux disease)   . H/O seasonal allergies   . History of GI bleed   . Hx of adenomatous colonic polyps 08/10/2017  . Hyperlipidemia   . Hypertension   . Hypotension   . Insomnia   . 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 Mallory-Weiss tear   . OA (osteoarthritis)    hands  . OSA (obstructive sleep apnea)    No longer uses CPAP  . Persistent atrial fibrillation with rapid ventricular response ([place]) 02/05/2020  . Recurrent syncope   . 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 Transient weakness of left lower extremity 09/07/2020  . Heme positive stool 05/14/2020  . Chronic anticoagulation 05/14/2020  . Benign essential HTN 04/27/2020   . Abnormality of gait 04/27/2020  . Salmonella enteritis 03/20/2020  . 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 Yeast infection 03/16/2020  . Diarrhea 03/16/2020  . 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 ([place])   . Allergies   . Anemia, chronic disease   . Chronic diastolic congestive heart failure ([place])   . History of hypertension   . Atrial fibrillation with RVR ([place])   . Debility   . Syncope and collapse 02/22/2020  . AKI (acute kidney injury) ([place]) 02/10/2020  . Unspecified protein-calorie malnutrition ([place]) 02/10/2020  . Weakness 02/06/2020  . Persistent atrial fibrillation with rapid ventricular response ([place]) 02/05/2020  . 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 Bradycardia 04/20/2019  . Atrial fibrillation with rapid ventricular response ([place])   . GI bleed 02/25/2019  . Fall at home, initial encounter 02/25/2019  . Bilateral leg edema 02/25/2019  . Hyponatremia 02/25/2019  . Acute posthemorrhagic anemia 08/11/2018  . Normochromic normocytic anemia 08/11/2018  . Hx of adenomatous colonic polyps 08/10/2017  . PAF (paroxysmal atrial fibrillation) ([place]) 02/05/2017  . Dislocation of hip, posterior, left, closed ([place]) 04/30/2016  . Hip fracture ([place]) 03/30/2016  . Hypertension 03/30/2016  . Hyperlipidemia 03/30/2016  .  GERD (gastroesophageal reflux disease) 03/30/2016  . Osteoarthritis 03/30/2016  . Depression 03/30/2016  . Rhabdomyolysis 03/30/2016  . Leukocytosis 03/30/2016  . Pressure injury of skin 03/30/2016  . Closed fracture of left hip [facility])     Past Surgical History:  Procedure Laterality Date  . ANKLE SURGERY Left 2014   had rods put in   . BIOPSY  02/28/2019   Procedure: BIOPSY;  Surgeon: Milus Banister, MD;  Location: [facility] ENDOSCOPY;  Service: Endoscopy;;  . CARDIOVERSION  02/06/2020  . CARDIOVERSION N/A 02/06/2020   Procedure: CARDIOVERSION;  Surgeon: Werner Lean, MD;   Location: [facility] ENDOSCOPY;  Service: Cardiovascular;  Laterality: N/A;  . CARDIOVERSION N/A 02/24/2020   Procedure: CARDIOVERSION;  Surgeon: Werner Lean, MD;  Location: [place] ENDOSCOPY;  Service: Cardiovascular;  Laterality: N/A;  . [place]  . ESOPHAGOGASTRODUODENOSCOPY (EGD) WITH PROPOFOL N/A 02/28/2019   Procedure: ESOPHAGOGASTRODUODENOSCOPY (EGD) WITH PROPOFOL;  Surgeon: Milus Banister, MD;  Location: [facility] ENDOSCOPY;  Service: Endoscopy;  Laterality: N/A;  . HIP ARTHROPLASTY Left 04/01/2016   Procedure: ARTHROPLASTY BIPOLAR HIP (HEMIARTHROPLASTY);  Surgeon: Paralee Cancel, MD;  Location: [place];  Service: Orthopedics;  Laterality: Left;  . HIP CLOSED REDUCTION Left 04/30/2016   Procedure: CLOSED REDUCTION HIP;  Surgeon: Wylene Simmer, MD;  Location: [place];  Service: Orthopedics;  Laterality: Left;  . TONSILLECTOMY    . TOTAL HIP ARTHROPLASTY Right 10/15/2019   Procedure: TOTAL HIP ARTHROPLASTY ANTERIOR APPROACH;  Surgeon: Paralee Cancel, MD;  Location: [facility] ORS;  Service: Orthopedics;  Laterality: Right;  70 mins       Family History  Problem Relation Age of Onset  . Heart disease Mother 27  . Hypertension Mother   . Arthritis Mother   . Heart failure Father 51  . Stroke Maternal Aunt   . Heart failure Sister   . Colon cancer Neg Hx   . Esophageal cancer Neg Hx   . Stomach cancer Neg Hx   . Rectal cancer Neg Hx     Social History   Tobacco Use  . Smoking status: Never Smoker  . Smokeless tobacco: Never Used  . Tobacco comment:    quit 20 years  smoked on and off for 20 years  Vaping Use  . Vaping Use: Never used  Substance Use Topics  . Alcohol use: Not Currently  . Drug use: Never    Home Medications Prior to Admission medications   Medication Sig Start Date End Date Taking? Authorizing Provider  acetaminophen (TYLENOL) 325 MG tablet Take 2 tablets (650 mg total) by mouth every 4 (four) hours as needed for headache or mild pain. 03/10/20   Angiulli,  Lavon Paganini, PA-C  amiodarone (PACERONE) 200 MG tablet Take 1 tablet (200 mg total) by mouth daily. 04/10/20   Dunn, Nedra Hai, PA-C  amLODipine (NORVASC) 5 MG tablet Take 1 tablet (5 mg total) by mouth daily. 09/14/20   Caren Griffins, MD  atorvastatin (LIPITOR) 20 MG tablet Take 1 tablet (20 mg total) by mouth daily. 03/10/20   Angiulli, Lavon Paganini, PA-C  ELIQUIS 5 MG TABS tablet TAKE 1 TABLET BY MOUTH TWICE DAILY. NEW CUSTOMER FOR [facility]. REQUESTING NEW PRESCRIPTION. Patient taking differently: Take 5 mg by mouth 2 (two) times daily. 08/12/20   Sherren Mocha, MD  escitalopram (LEXAPRO) 10 MG tablet Take 1 tablet (10 mg total) by mouth daily. 03/10/20   Angiulli, Lavon Paganini, PA-C  ferrous sulfate 325 (65 FE) MG tablet Take 1  tablet (325 mg total) by mouth 2 (two) times daily with a meal. 03/10/20 04/10/20  Angiulli, Lavon Paganini, PA-C  folic acid (FOLVITE) 1 MG tablet Take 1 tablet (1 mg total) by mouth daily. 03/10/20   Angiulli, Lavon Paganini, PA-C  furosemide (LASIX) 40 MG tablet Take 1 tablet (40 mg total) by mouth daily. 08/03/20 07/29/21  Sherren Mocha, MD  ketoconazole (NIZORAL) 2 % cream Apply 1 application topically daily. To dry scaly skin on feet Patient not taking: No sig reported 07/23/20   Criselda Peaches, DPM  loratadine (CLARITIN) 10 MG tablet Take 1 tablet (10 mg total) by mouth daily. 02/12/20   Medina-Vargas, Monina C, NP  Magnesium Oxide 400 MG CAPS Take 1 capsule (400 mg total) by mouth 2 (two) times daily. 05/04/20   Dunn, Nedra Hai, PA-C  Multiple Vitamins-Minerals (CENTRUM SILVER 50+MEN) TABS Take 1 tablet by mouth daily with breakfast.    [provider]  nitroGLYCERIN (NITROSTAT) 0.4 MG SL tablet Place 1 tablet (0.4 mg total) under the tongue every 5 (five) minutes x 3 doses as needed for chest pain. 07/28/20   Baldwin Jamaica, PA-C  pantoprazole (PROTONIX) 40 MG tablet Take 1 tablet (40 mg total) by mouth daily. 03/10/20   Angiulli, Lavon Paganini, PA-C  potassium chloride SA (KLOR-CON)  20 MEQ tablet Take 2 tablets (40 mEq total) by mouth daily. Patient taking differently: Take 20 mEq by mouth 2 (two) times daily. 08/03/20 07/29/21  Sherren Mocha, MD  predniSONE (DELTASONE) 20 MG tablet Take 3 tablets (60 mg total) by mouth daily with breakfast. 09/13/20 10/13/20  Caren Griffins, MD  silver sulfADIAZINE (SILVADENE) 1 % cream Apply 1 application topically daily. Patient not taking: No sig reported 07/23/20   Criselda Peaches, DPM  tamsulosin (FLOMAX) 0.4 MG CAPS capsule Take 1 capsule (0.4 mg total) by mouth every evening. 03/10/20   Angiulli, Lavon Paganini, PA-C  traZODone (DESYREL) 50 MG tablet Take 1 tablet (50 mg total) by mouth at bedtime. 03/10/20   Angiulli, Lavon Paganini, PA-C  triamcinolone ointment (KENALOG) 0.1 % Apply 1 application topically 2 (two) times daily. legs 08/20/20   [provider]    Allergies    Penicillins and Penicillins  Review of Systems   Review of Systems  Constitutional: Negative for chills and fever.  Eyes: Negative for pain and visual disturbance.  Respiratory: Negative for cough and shortness of breath.   Cardiovascular: Negative for chest pain and palpitations.  Gastrointestinal: Negative for abdominal pain and vomiting.  Musculoskeletal: Negative for arthralgias and myalgias.  Skin: Positive for wound. Negative for rash.  Neurological: Negative for syncope and light-headedness.  All other systems reviewed and are negative.   Physical Exam Updated Vital Signs BP (!) 159/76   Pulse 78   Temp 97.6 F (36.4 C) (Oral)   Resp 20   Ht 6' (1.829 m)   Wt 108.9 kg   SpO2 100%   BMI 32.55 kg/m   Physical Exam Constitutional:      General: He is not in acute distress. HENT:     Head: Normocephalic and atraumatic.  Eyes:     Conjunctiva/sclera: Conjunctivae normal.     Pupils: Pupils are equal, round, and reactive to light.  Cardiovascular:     Rate and Rhythm: Normal rate and regular rhythm.  Pulmonary:     Effort: Pulmonary  effort is normal. No respiratory distress.  Abdominal:     General: There is no distension.  Tenderness: There is no abdominal tenderness.  Musculoskeletal:     Right lower leg: Edema present.     Left lower leg: Edema present.  Skin:    General: Skin is warm and dry.     Comments: 2 cm curvilinear laceration to dorsum of left mid foot No active bleed Small weepage of water from foot No focal bony ttp Patient can bear weight without pain  Neurological:     General: No focal deficit present.     Mental Status: He is alert. Mental status is at baseline.  Psychiatric:        Mood and Affect: Mood normal.        Behavior: Behavior normal.     ED Results / Procedures / Treatments   Labs (all labs ordered are listed, but only abnormal results are displayed) Labs Reviewed - No data to display  EKG None  Radiology No results found.  Procedures .Marland KitchenLaceration Repair  Date/Time: 09/26/2020 9:48 AM Performed by: Wyvonnia Dusky, MD Authorized by: Wyvonnia Dusky, MD   Consent:    Consent obtained:  Verbal   Consent given by:  Patient   Risks discussed:  Infection, pain, poor cosmetic result and poor wound healing   Alternatives discussed:  No treatment Universal protocol:    Procedure explained and questions answered to patient or proxy's satisfaction: yes     Relevant documents present and verified: yes     Test results available: yes     Imaging studies available: yes     Required blood products, implants, devices, and special equipment available: yes     Site/side marked: yes     Immediately prior to procedure, a time out was called: yes     Patient identity confirmed:  Arm band and anonymous protocol, patient vented/unresponsive Anesthesia:    Anesthesia method:  None Laceration details:    Location:  Foot   Foot location:  Top of L foot   Length (cm):  2   Depth (mm):  2 Pre-procedure details:    Preparation:  Patient was prepped and draped in usual  sterile fashion Exploration:    Hemostasis achieved with:  Direct pressure   Wound exploration: wound explored through full range of motion     Contaminated: no   Treatment:    Area cleansed with:  Saline   Amount of cleaning:  Standard   Visualized foreign bodies/material removed: no   Skin repair:    Repair method:  Sutures   Suture size:  4-0   Suture material:  Prolene   Suture technique:  Simple interrupted   Number of sutures:  4 Approximation:    Approximation:  Close Repair type:    Repair type:  Simple Post-procedure details:    Dressing:  Non-adherent dressing   Procedure completion:  Tolerated well, no immediate complications     Medications Ordered in ED Medications - No data to display  ED Course  I have reviewed the triage vital signs and the nursing notes.  Pertinent labs & imaging results that were available during my care of the patient were reviewed by me and considered in my medical decision making (see chart for details).   Patient here with small laceration dorsum of left foot.  No active bleeding on exam.  I was able to close this with 4 sutures after cleaning the wound.  He has no bony tenderness, and is able to bear weight, and I have a very low suspicion at this time  for bony fracture.  This is a fairly small and superficial wound.  He reports he sees his PCP regularly and believes all his vaccines are up-to-date.  He is okay for discharge.  No sign of infection at this time, or vascular injury.     Final Clinical Impression(s) / ED Diagnoses Final diagnoses:  Laceration of dorsum of left foot    Rx / DC Orders ED Discharge Orders    None       Anguel Delapena, Carola Rhine, MD 09/26/20 [phone]

## 2020-09-28 DIAGNOSIS — N1832 Chronic kidney disease, stage 3b: Secondary | ICD-10-CM | POA: Diagnosis not present

## 2020-09-28 DIAGNOSIS — I129 Hypertensive chronic kidney disease with stage 1 through stage 4 chronic kidney disease, or unspecified chronic kidney disease: Secondary | ICD-10-CM | POA: Diagnosis not present

## 2020-09-28 DIAGNOSIS — N058 Unspecified nephritic syndrome with other morphologic changes: Secondary | ICD-10-CM | POA: Diagnosis not present

## 2020-09-28 DIAGNOSIS — N057 Unspecified nephritic syndrome with diffuse crescentic glomerulonephritis: Secondary | ICD-10-CM | POA: Diagnosis not present

## 2020-09-28 DIAGNOSIS — N179 Acute kidney failure, unspecified: Secondary | ICD-10-CM | POA: Diagnosis not present

## 2020-09-28 DIAGNOSIS — N189 Chronic kidney disease, unspecified: Secondary | ICD-10-CM | POA: Diagnosis not present

## 2020-09-28 DIAGNOSIS — D631 Anemia in chronic kidney disease: Secondary | ICD-10-CM | POA: Diagnosis not present

## 2020-09-28 DIAGNOSIS — R600 Localized edema: Secondary | ICD-10-CM | POA: Diagnosis not present

## 2020-09-28 DIAGNOSIS — R319 Hematuria, unspecified: Secondary | ICD-10-CM | POA: Diagnosis not present

## 2020-09-28 DIAGNOSIS — R809 Proteinuria, unspecified: Secondary | ICD-10-CM | POA: Diagnosis not present

## 2020-10-01 DIAGNOSIS — M47815 Spondylosis without myelopathy or radiculopathy, thoracolumbar region: Secondary | ICD-10-CM | POA: Diagnosis not present

## 2020-10-01 DIAGNOSIS — M47816 Spondylosis without myelopathy or radiculopathy, lumbar region: Secondary | ICD-10-CM | POA: Diagnosis not present

## 2020-10-01 DIAGNOSIS — N179 Acute kidney failure, unspecified: Secondary | ICD-10-CM | POA: Diagnosis not present

## 2020-10-01 DIAGNOSIS — I110 Hypertensive heart disease with heart failure: Secondary | ICD-10-CM | POA: Diagnosis not present

## 2020-10-01 DIAGNOSIS — I5033 Acute on chronic diastolic (congestive) heart failure: Secondary | ICD-10-CM | POA: Diagnosis not present

## 2020-10-01 DIAGNOSIS — G459 Transient cerebral ischemic attack, unspecified: Secondary | ICD-10-CM | POA: Diagnosis not present

## 2020-10-06 ENCOUNTER — Other Ambulatory Visit (HOSPITAL_COMMUNITY): Payer: Self-pay | Admitting: *Deleted

## 2020-10-07 ENCOUNTER — Other Ambulatory Visit: Payer: Self-pay

## 2020-10-07 ENCOUNTER — Ambulatory Visit (HOSPITAL_COMMUNITY)
Admission: RE | Admit: 2020-10-07 | Discharge: 2020-10-07 | Disposition: A | Discharge status: Home / Self Care | Payer: Medicare Other | Source: Ambulatory Visit | Attending: Nephrology | Admitting: Nephrology

## 2020-10-07 DIAGNOSIS — N189 Chronic kidney disease, unspecified: Secondary | ICD-10-CM | POA: Insufficient documentation

## 2020-10-07 DIAGNOSIS — D631 Anemia in chronic kidney disease: Secondary | ICD-10-CM | POA: Diagnosis not present

## 2020-10-07 DIAGNOSIS — Z4802 Encounter for removal of sutures: Secondary | ICD-10-CM | POA: Diagnosis not present

## 2020-10-07 DIAGNOSIS — S91312D Laceration without foreign body, left foot, subsequent encounter: Secondary | ICD-10-CM | POA: Diagnosis not present

## 2020-10-07 LAB — PREPARE RBC (CROSSMATCH)

## 2020-10-07 MED ORDER — SODIUM CHLORIDE 0.9% IV SOLUTION: Freq: Once | INTRAVENOUS | Status: DC

## 2020-10-08 DIAGNOSIS — M47816 Spondylosis without myelopathy or radiculopathy, lumbar region: Secondary | ICD-10-CM | POA: Diagnosis not present

## 2020-10-08 DIAGNOSIS — G459 Transient cerebral ischemic attack, unspecified: Secondary | ICD-10-CM | POA: Diagnosis not present

## 2020-10-08 DIAGNOSIS — I110 Hypertensive heart disease with heart failure: Secondary | ICD-10-CM | POA: Diagnosis not present

## 2020-10-08 DIAGNOSIS — I5033 Acute on chronic diastolic (congestive) heart failure: Secondary | ICD-10-CM | POA: Diagnosis not present

## 2020-10-08 DIAGNOSIS — N179 Acute kidney failure, unspecified: Secondary | ICD-10-CM | POA: Diagnosis not present

## 2020-10-08 DIAGNOSIS — M47815 Spondylosis without myelopathy or radiculopathy, thoracolumbar region: Secondary | ICD-10-CM | POA: Diagnosis not present

## 2020-10-08 LAB — TYPE AND SCREEN
ABO/RH(D): O POS
Antibody Screen: NEGATIVE
Unit division: 0

## 2020-10-08 LAB — BPAM RBC
Blood Product Expiration Date: 202207012359
ISSUE DATE / TIME: 202206010945
Unit Type and Rh: 5100

## 2020-10-12 DIAGNOSIS — N058 Unspecified nephritic syndrome with other morphologic changes: Secondary | ICD-10-CM | POA: Diagnosis not present

## 2020-10-13 ENCOUNTER — Emergency Department (HOSPITAL_COMMUNITY): Payer: Medicare Other

## 2020-10-13 ENCOUNTER — Encounter (HOSPITAL_COMMUNITY): Payer: Self-pay | Admitting: Family Medicine

## 2020-10-13 ENCOUNTER — Inpatient Hospital Stay (HOSPITAL_COMMUNITY)
Admission: EM | Admit: 2020-10-13 | Discharge: 2020-10-17 | DRG: 603 | Disposition: A | Discharge status: Home Health | Payer: Medicare Other | Source: Ambulatory Visit | Attending: Internal Medicine | Admitting: Internal Medicine

## 2020-10-13 ENCOUNTER — Other Ambulatory Visit: Payer: Self-pay

## 2020-10-13 DIAGNOSIS — K429 Umbilical hernia without obstruction or gangrene: Secondary | ICD-10-CM | POA: Diagnosis not present

## 2020-10-13 DIAGNOSIS — I050 Rheumatic mitral stenosis: Secondary | ICD-10-CM | POA: Diagnosis not present

## 2020-10-13 DIAGNOSIS — Z20822 Contact with and (suspected) exposure to covid-19: Secondary | ICD-10-CM | POA: Diagnosis present

## 2020-10-13 DIAGNOSIS — I4819 Other persistent atrial fibrillation: Secondary | ICD-10-CM | POA: Diagnosis present

## 2020-10-13 DIAGNOSIS — D638 Anemia in other chronic diseases classified elsewhere: Secondary | ICD-10-CM | POA: Diagnosis not present

## 2020-10-13 DIAGNOSIS — K8020 Calculus of gallbladder without cholecystitis without obstruction: Secondary | ICD-10-CM | POA: Diagnosis not present

## 2020-10-13 DIAGNOSIS — E785 Hyperlipidemia, unspecified: Secondary | ICD-10-CM | POA: Diagnosis present

## 2020-10-13 DIAGNOSIS — Z79899 Other long term (current) drug therapy: Secondary | ICD-10-CM | POA: Diagnosis not present

## 2020-10-13 DIAGNOSIS — K219 Gastro-esophageal reflux disease without esophagitis: Secondary | ICD-10-CM | POA: Diagnosis present

## 2020-10-13 DIAGNOSIS — K7460 Unspecified cirrhosis of liver: Secondary | ICD-10-CM | POA: Diagnosis not present

## 2020-10-13 DIAGNOSIS — L97529 Non-pressure chronic ulcer of other part of left foot with unspecified severity: Secondary | ICD-10-CM

## 2020-10-13 DIAGNOSIS — Z823 Family history of stroke: Secondary | ICD-10-CM | POA: Diagnosis not present

## 2020-10-13 DIAGNOSIS — K5730 Diverticulosis of large intestine without perforation or abscess without bleeding: Secondary | ICD-10-CM | POA: Diagnosis not present

## 2020-10-13 DIAGNOSIS — L0390 Cellulitis, unspecified: Secondary | ICD-10-CM | POA: Diagnosis not present

## 2020-10-13 DIAGNOSIS — L03032 Cellulitis of left toe: Secondary | ICD-10-CM | POA: Diagnosis present

## 2020-10-13 DIAGNOSIS — N057 Unspecified nephritic syndrome with diffuse crescentic glomerulonephritis: Secondary | ICD-10-CM | POA: Diagnosis not present

## 2020-10-13 DIAGNOSIS — G4700 Insomnia, unspecified: Secondary | ICD-10-CM | POA: Diagnosis not present

## 2020-10-13 DIAGNOSIS — I110 Hypertensive heart disease with heart failure: Secondary | ICD-10-CM | POA: Diagnosis not present

## 2020-10-13 DIAGNOSIS — Z87891 Personal history of nicotine dependence: Secondary | ICD-10-CM

## 2020-10-13 DIAGNOSIS — Z7952 Long term (current) use of systemic steroids: Secondary | ICD-10-CM | POA: Diagnosis not present

## 2020-10-13 DIAGNOSIS — Z880 Allergy status to penicillin: Secondary | ICD-10-CM

## 2020-10-13 DIAGNOSIS — Z8674 Personal history of sudden cardiac arrest: Secondary | ICD-10-CM | POA: Diagnosis not present

## 2020-10-13 DIAGNOSIS — Z8719 Personal history of other diseases of the digestive system: Secondary | ICD-10-CM

## 2020-10-13 DIAGNOSIS — I10 Essential (primary) hypertension: Secondary | ICD-10-CM | POA: Diagnosis present

## 2020-10-13 DIAGNOSIS — I5032 Chronic diastolic (congestive) heart failure: Secondary | ICD-10-CM | POA: Diagnosis present

## 2020-10-13 DIAGNOSIS — M19041 Primary osteoarthritis, right hand: Secondary | ICD-10-CM | POA: Diagnosis present

## 2020-10-13 DIAGNOSIS — L03116 Cellulitis of left lower limb: Secondary | ICD-10-CM | POA: Diagnosis not present

## 2020-10-13 DIAGNOSIS — I5033 Acute on chronic diastolic (congestive) heart failure: Secondary | ICD-10-CM | POA: Diagnosis not present

## 2020-10-13 DIAGNOSIS — I4891 Unspecified atrial fibrillation: Secondary | ICD-10-CM | POA: Diagnosis not present

## 2020-10-13 DIAGNOSIS — G459 Transient cerebral ischemic attack, unspecified: Secondary | ICD-10-CM | POA: Diagnosis not present

## 2020-10-13 DIAGNOSIS — N400 Enlarged prostate without lower urinary tract symptoms: Secondary | ICD-10-CM | POA: Diagnosis not present

## 2020-10-13 DIAGNOSIS — N281 Cyst of kidney, acquired: Secondary | ICD-10-CM | POA: Diagnosis not present

## 2020-10-13 DIAGNOSIS — M47815 Spondylosis without myelopathy or radiculopathy, thoracolumbar region: Secondary | ICD-10-CM | POA: Diagnosis not present

## 2020-10-13 DIAGNOSIS — I130 Hypertensive heart and chronic kidney disease with heart failure and stage 1 through stage 4 chronic kidney disease, or unspecified chronic kidney disease: Secondary | ICD-10-CM | POA: Diagnosis present

## 2020-10-13 DIAGNOSIS — N184 Chronic kidney disease, stage 4 (severe): Secondary | ICD-10-CM | POA: Diagnosis present

## 2020-10-13 DIAGNOSIS — M47816 Spondylosis without myelopathy or radiculopathy, lumbar region: Secondary | ICD-10-CM | POA: Diagnosis not present

## 2020-10-13 DIAGNOSIS — J9811 Atelectasis: Secondary | ICD-10-CM | POA: Diagnosis not present

## 2020-10-13 DIAGNOSIS — K760 Fatty (change of) liver, not elsewhere classified: Secondary | ICD-10-CM | POA: Diagnosis not present

## 2020-10-13 DIAGNOSIS — Z8249 Family history of ischemic heart disease and other diseases of the circulatory system: Secondary | ICD-10-CM | POA: Diagnosis not present

## 2020-10-13 DIAGNOSIS — N058 Unspecified nephritic syndrome with other morphologic changes: Secondary | ICD-10-CM | POA: Diagnosis not present

## 2020-10-13 DIAGNOSIS — D509 Iron deficiency anemia, unspecified: Secondary | ICD-10-CM | POA: Diagnosis not present

## 2020-10-13 DIAGNOSIS — Z7901 Long term (current) use of anticoagulants: Secondary | ICD-10-CM | POA: Diagnosis not present

## 2020-10-13 DIAGNOSIS — Z86010 Personal history of colonic polyps: Secondary | ICD-10-CM

## 2020-10-13 DIAGNOSIS — D631 Anemia in chronic kidney disease: Secondary | ICD-10-CM | POA: Diagnosis present

## 2020-10-13 DIAGNOSIS — R319 Hematuria, unspecified: Secondary | ICD-10-CM | POA: Diagnosis not present

## 2020-10-13 DIAGNOSIS — I480 Paroxysmal atrial fibrillation: Secondary | ICD-10-CM | POA: Diagnosis not present

## 2020-10-13 DIAGNOSIS — F329 Major depressive disorder, single episode, unspecified: Secondary | ICD-10-CM | POA: Diagnosis present

## 2020-10-13 DIAGNOSIS — Z8261 Family history of arthritis: Secondary | ICD-10-CM

## 2020-10-13 DIAGNOSIS — N179 Acute kidney failure, unspecified: Secondary | ICD-10-CM | POA: Diagnosis not present

## 2020-10-13 DIAGNOSIS — R809 Proteinuria, unspecified: Secondary | ICD-10-CM | POA: Diagnosis not present

## 2020-10-13 DIAGNOSIS — L089 Local infection of the skin and subcutaneous tissue, unspecified: Secondary | ICD-10-CM | POA: Diagnosis not present

## 2020-10-13 DIAGNOSIS — M7989 Other specified soft tissue disorders: Secondary | ICD-10-CM | POA: Diagnosis not present

## 2020-10-13 DIAGNOSIS — I129 Hypertensive chronic kidney disease with stage 1 through stage 4 chronic kidney disease, or unspecified chronic kidney disease: Secondary | ICD-10-CM | POA: Diagnosis not present

## 2020-10-13 DIAGNOSIS — F419 Anxiety disorder, unspecified: Secondary | ICD-10-CM | POA: Diagnosis not present

## 2020-10-13 DIAGNOSIS — Z96643 Presence of artificial hip joint, bilateral: Secondary | ICD-10-CM | POA: Diagnosis present

## 2020-10-13 DIAGNOSIS — M19042 Primary osteoarthritis, left hand: Secondary | ICD-10-CM | POA: Diagnosis present

## 2020-10-13 DIAGNOSIS — G319 Degenerative disease of nervous system, unspecified: Secondary | ICD-10-CM | POA: Diagnosis not present

## 2020-10-13 DIAGNOSIS — R600 Localized edema: Secondary | ICD-10-CM | POA: Diagnosis not present

## 2020-10-13 DIAGNOSIS — I495 Sick sinus syndrome: Secondary | ICD-10-CM | POA: Diagnosis not present

## 2020-10-13 DIAGNOSIS — G4733 Obstructive sleep apnea (adult) (pediatric): Secondary | ICD-10-CM | POA: Diagnosis not present

## 2020-10-13 DIAGNOSIS — N189 Chronic kidney disease, unspecified: Secondary | ICD-10-CM | POA: Diagnosis not present

## 2020-10-13 LAB — CBC WITH DIFFERENTIAL/PLATELET
Abs Immature Granulocytes: 0.14 10*3/uL — ABNORMAL HIGH (ref 0.00–0.07)
Basophils Absolute: 0 10*3/uL (ref 0.0–0.1)
Basophils Relative: 0 %
Eosinophils Absolute: 0 10*3/uL (ref 0.0–0.5)
Eosinophils Relative: 0 %
HCT: 25.2 % — ABNORMAL LOW (ref 39.0–52.0)
Hemoglobin: 7.7 g/dL — ABNORMAL LOW (ref 13.0–17.0)
Immature Granulocytes: 1 %
Lymphocytes Relative: 3 %
Lymphs Abs: 0.6 10*3/uL — ABNORMAL LOW (ref 0.7–4.0)
MCH: 31.7 pg (ref 26.0–34.0)
MCHC: 30.6 g/dL (ref 30.0–36.0)
MCV: 103.7 fL — ABNORMAL HIGH (ref 80.0–100.0)
Monocytes Absolute: 1 10*3/uL (ref 0.1–1.0)
Monocytes Relative: 6 %
Neutro Abs: 14.6 10*3/uL — ABNORMAL HIGH (ref 1.7–7.7)
Neutrophils Relative %: 90 %
Platelets: 225 10*3/uL (ref 150–400)
RBC: 2.43 MIL/uL — ABNORMAL LOW (ref 4.22–5.81)
RDW: 14.4 % (ref 11.5–15.5)
WBC: 16.3 10*3/uL — ABNORMAL HIGH (ref 4.0–10.5)
nRBC: 0 % (ref 0.0–0.2)

## 2020-10-13 LAB — COMPREHENSIVE METABOLIC PANEL
ALT: 29 U/L (ref 0–44)
AST: 21 U/L (ref 15–41)
Albumin: 3.2 g/dL — ABNORMAL LOW (ref 3.5–5.0)
Alkaline Phosphatase: 64 U/L (ref 38–126)
Anion gap: 12 (ref 5–15)
BUN: 126 mg/dL — ABNORMAL HIGH (ref 8–23)
CO2: 28 mmol/L (ref 22–32)
Calcium: 9.2 mg/dL (ref 8.9–10.3)
Chloride: 94 mmol/L — ABNORMAL LOW (ref 98–111)
Creatinine, Ser: 3.37 mg/dL — ABNORMAL HIGH (ref 0.61–1.24)
GFR, Estimated: 18 mL/min — ABNORMAL LOW (ref >60)
Glucose, Bld: 115 mg/dL — ABNORMAL HIGH (ref 70–99)
Potassium: 3.5 mmol/L (ref 3.5–5.1)
Sodium: 134 mmol/L — ABNORMAL LOW (ref 135–145)
Total Bilirubin: 0.6 mg/dL (ref 0.3–1.2)
Total Protein: 5.8 g/dL — ABNORMAL LOW (ref 6.5–8.1)

## 2020-10-13 LAB — RESP PANEL BY RT-PCR (FLU A&B, COVID) ARPGX2
Influenza A by PCR: NEGATIVE
Influenza B by PCR: NEGATIVE
SARS Coronavirus 2 by RT PCR: NEGATIVE

## 2020-10-13 MED ORDER — VANCOMYCIN HCL 2000 MG/400ML IV SOLN
2000.0000 mg | Freq: Once | INTRAVENOUS | Status: AC
  Administered 2020-10-13: 2000 mg via INTRAVENOUS
  Filled 2020-10-13: qty 400

## 2020-10-13 MED ORDER — TORSEMIDE 20 MG PO TABS
20.0000 mg | ORAL_TABLET | Freq: Every day | ORAL | Status: DC
  Administered 2020-10-14: 20 mg via ORAL
  Filled 2020-10-13: qty 1

## 2020-10-13 MED ORDER — ADULT MULTIVITAMIN W/MINERALS CH
1.0000 | ORAL_TABLET | Freq: Every day | ORAL | Status: DC
  Administered 2020-10-14 – 2020-10-17 (×4): 1 via ORAL
  Filled 2020-10-13 (×4): qty 1

## 2020-10-13 MED ORDER — ACETAMINOPHEN 325 MG PO TABS: 650.0000 mg | ORAL_TABLET | Freq: Four times a day (QID) | ORAL | Status: DC | PRN

## 2020-10-13 MED ORDER — SENNOSIDES-DOCUSATE SODIUM 8.6-50 MG PO TABS: 1.0000 | ORAL_TABLET | Freq: Every evening | ORAL | Status: DC | PRN

## 2020-10-13 MED ORDER — APIXABAN 5 MG PO TABS
5.0000 mg | ORAL_TABLET | Freq: Two times a day (BID) | ORAL | Status: DC
  Administered 2020-10-14: 5 mg via ORAL
  Filled 2020-10-13 (×2): qty 1

## 2020-10-13 MED ORDER — ATORVASTATIN CALCIUM 10 MG PO TABS
20.0000 mg | ORAL_TABLET | Freq: Every day | ORAL | Status: DC
  Administered 2020-10-14 – 2020-10-17 (×4): 20 mg via ORAL
  Filled 2020-10-13 (×4): qty 2

## 2020-10-13 MED ORDER — TRAZODONE HCL 50 MG PO TABS
50.0000 mg | ORAL_TABLET | Freq: Every day | ORAL | Status: DC
  Administered 2020-10-14 – 2020-10-16 (×4): 50 mg via ORAL
  Filled 2020-10-13 (×4): qty 1

## 2020-10-13 MED ORDER — SODIUM CHLORIDE 0.9 % IV SOLN: 250.0000 mL | INTRAVENOUS | Status: DC | PRN

## 2020-10-13 MED ORDER — PREDNISONE 20 MG PO TABS
60.0000 mg | ORAL_TABLET | Freq: Every day | ORAL | Status: DC
  Administered 2020-10-14: 60 mg via ORAL
  Filled 2020-10-13: qty 3

## 2020-10-13 MED ORDER — SODIUM CHLORIDE 0.9 % IV SOLN
2.0000 g | INTRAVENOUS | Status: DC
  Administered 2020-10-14 – 2020-10-16 (×4): 2 g via INTRAVENOUS
  Filled 2020-10-13 (×5): qty 2

## 2020-10-13 MED ORDER — AMIODARONE HCL 200 MG PO TABS
200.0000 mg | ORAL_TABLET | Freq: Every day | ORAL | Status: DC
  Administered 2020-10-14 – 2020-10-17 (×4): 200 mg via ORAL
  Filled 2020-10-13 (×4): qty 1

## 2020-10-13 MED ORDER — AMLODIPINE BESYLATE 5 MG PO TABS
5.0000 mg | ORAL_TABLET | Freq: Every day | ORAL | Status: DC
  Administered 2020-10-14: 5 mg via ORAL
  Filled 2020-10-13: qty 1

## 2020-10-13 MED ORDER — POTASSIUM CHLORIDE CRYS ER 20 MEQ PO TBCR
20.0000 meq | EXTENDED_RELEASE_TABLET | Freq: Two times a day (BID) | ORAL | Status: DC
  Administered 2020-10-14 – 2020-10-17 (×8): 20 meq via ORAL
  Filled 2020-10-13 (×8): qty 1

## 2020-10-13 MED ORDER — SODIUM CHLORIDE 0.9% FLUSH
3.0000 mL | Freq: Two times a day (BID) | INTRAVENOUS | Status: DC
  Administered 2020-10-14 – 2020-10-16 (×6): 3 mL via INTRAVENOUS

## 2020-10-13 MED ORDER — TAMSULOSIN HCL 0.4 MG PO CAPS
0.4000 mg | ORAL_CAPSULE | Freq: Every evening | ORAL | Status: DC
  Administered 2020-10-14 – 2020-10-16 (×3): 0.4 mg via ORAL
  Filled 2020-10-13 (×3): qty 1

## 2020-10-13 MED ORDER — ACETAMINOPHEN 650 MG RE SUPP: 650.0000 mg | Freq: Four times a day (QID) | RECTAL | Status: DC | PRN

## 2020-10-13 MED ORDER — SODIUM CHLORIDE 0.9% FLUSH: 3.0000 mL | INTRAVENOUS | Status: DC | PRN

## 2020-10-13 MED ORDER — SODIUM CHLORIDE 0.9 % IV SOLN
1.0000 g | Freq: Once | INTRAVENOUS | Status: AC
  Administered 2020-10-13: 1 g via INTRAVENOUS
  Filled 2020-10-13: qty 10

## 2020-10-13 NOTE — ED Provider Notes (Signed)
Emergency Medicine Provider Triage Evaluation Note  Darryl Diaz , a 80 y.o. male  was evaluated in triage.  Pt complains of left toe wound. Unsure how long its been like that - it is black, infected per physician. He also has significant swelling to legs but is taking fluids and has lost 20 lbs in the last two weeks. No diabetes.   Review of Systems  Positive: Foot wound, leg swelling  Negative: Chest pain, SOB, fever, chills   Physical Exam  BP 139/68 (BP Location: Left Arm)   Pulse 70   Temp 97.6 F (36.4 C) (Oral)   Resp 17   SpO2 100%  Gen:   Awake, no distress   Resp:  Normal effort  MSK:   Moves extremities without difficulty  Other:  2+ pitting edema bilaterally. Unable to palpabe DP/PT pulses due to edema.  Medical Decision Making  Medically screening exam initiated at 11:45 AM.  Appropriate orders placed.  Darryl Diaz was informed that the remainder of the evaluation will be completed by another provider, this initial triage assessment does not replace that evaluation, and the importance of remaining in the ED until their evaluation is complete.    Sherrill Raring, PA-C 10/13/20 1146    Charlesetta Shanks, MD 10/20/20 2153

## 2020-10-13 NOTE — ED Notes (Signed)
Admitting provider at bedside.

## 2020-10-13 NOTE — ED Provider Notes (Signed)
[facility] EMERGENCY DEPARTMENT Provider Note   CSN: [id number] Arrival date & time: 10/13/20  1056     History Chief Complaint  Patient presents with  . Wound Infection    Darryl Diaz is a 80 y.o. male w PMHx HTN, CHF, a fib on eliquis, presenting to the ED with complaint of infection to left great toe. Patient states he was at nephrology office today for routine check up and they noticed wound to bottom of great toe. He did not notice it, states his vision isn't good enough to see his feet. He did have a wound to the dorsum of his left foot that was sutured on 09/26/2020 in the ED.  He states to his knowledge it healed well and his sutures were removed on 6/1 by his PCP.  He is having some pain to the dorsum of his foot.  Has been taking torsemide daily and has had 20 pound weight loss since taking this medication and is feeling better in terms of his chronic lower extremity edema.  He has noticed his left foot is weeping.  He is not having fevers or chills.  He does not feel unwell.  Denies history of diabetes.  The history is provided by the patient and medical records.       Past Medical History:  Diagnosis Date  . 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 Anemia   . 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 Bradycardia    low 20's  . Bradycardic cardiac arrest ([place])   . Cardiac arrest ([place]) 04/20/2019   12 min CPR with epi  . Chronic diastolic CHF (congestive heart failure) ([place])   . Fall at home 03/28/2016  . Fatty liver   . Gastritis   . GERD (gastroesophageal reflux disease)   . H/O seasonal allergies   . History of GI bleed   . Hx of adenomatous colonic polyps 08/10/2017  . Hyperlipidemia   . Hypertension   . Hypotension   . Insomnia   . 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 Mallory-Weiss tear   . OA (osteoarthritis)    hands  . OSA (obstructive sleep apnea)    No longer uses CPAP  . Persistent atrial  fibrillation with rapid ventricular response ([place]) 02/05/2020  . Recurrent syncope   . 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 Cellulitis of great toe of left foot 10/13/2020  . Skin ulcer of left great toe ([place]) 10/13/2020  . CKD (chronic kidney disease) stage 4, 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/13/2020  . Chronic heart failure with preserved ejection fraction (HFpEF) ([place]) 10/13/2020  . Transient weakness of left lower extremity 09/07/2020  . Heme positive stool 05/14/2020  . Chronic anticoagulation 05/14/2020  . Benign essential HTN 04/27/2020  . Abnormality of gait 04/27/2020  . Salmonella enteritis 03/20/2020  . 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 Yeast infection 03/16/2020  . Diarrhea 03/16/2020  . 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 ([place])   . Allergies   . Anemia, chronic disease   . Chronic diastolic congestive heart failure ([place])   . History of hypertension   . Atrial fibrillation with RVR ([place])   . Debility   . Syncope and collapse 02/22/2020  . AKI (acute kidney injury) ([place]) 02/10/2020  . Unspecified protein-calorie malnutrition ([place]) 02/10/2020  . Weakness 02/06/2020  . Persistent atrial  fibrillation with rapid ventricular response ([place]) 02/05/2020  . 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 Bradycardia 04/20/2019  . Atrial fibrillation with rapid ventricular response ([place])   . GI bleed 02/25/2019  . Fall at home, initial encounter 02/25/2019  . Bilateral leg edema 02/25/2019  . Hyponatremia 02/25/2019  . Acute posthemorrhagic anemia 08/11/2018  . Normochromic normocytic anemia 08/11/2018  . Hx of adenomatous colonic polyps 08/10/2017  . PAF (paroxysmal atrial fibrillation) ([place]) 02/05/2017  . Dislocation of hip, posterior, left, closed ([place]) 04/30/2016  . Hip fracture ([place]) 03/30/2016  . Hypertension 03/30/2016  . Hyperlipidemia 03/30/2016   . GERD (gastroesophageal reflux disease) 03/30/2016  . Osteoarthritis 03/30/2016  . Depression 03/30/2016  . Rhabdomyolysis 03/30/2016  . Leukocytosis 03/30/2016  . Pressure injury of skin 03/30/2016  . Closed fracture of left hip [facility])     Past Surgical History:  Procedure Laterality Date  . ANKLE SURGERY Left 2014   had rods put in   . BIOPSY  02/28/2019   Procedure: BIOPSY;  Surgeon: Milus Banister, MD;  Location: [facility] ENDOSCOPY;  Service: Endoscopy;;  . CARDIOVERSION  02/06/2020  . CARDIOVERSION N/A 02/06/2020   Procedure: CARDIOVERSION;  Surgeon: Werner Lean, MD;  Location[facility] ENDOSCOPY;  Service: Cardiovascular;  Laterality: N/A;  . CARDIOVERSION N/A 02/24/2020   Procedure: CARDIOVERSION;  Surgeon: Werner Lean, MD;  Location: [facility] ENDOSCOPY;  Service: Cardiovascular;  Laterality: N/A;  . [place]  . ESOPHAGOGASTRODUODENOSCOPY (EGD) WITH PROPOFOL N/A 02/28/2019   Procedure: ESOPHAGOGASTRODUODENOSCOPY (EGD) WITH PROPOFOL;  Surgeon: Milus Banister, MD;  Location: [facility] ENDOSCOPY;  Service: Endoscopy;  Laterality: N/A;  . HIP ARTHROPLASTY Left 04/01/2016   Procedure: ARTHROPLASTY BIPOLAR HIP (HEMIARTHROPLASTY);  Surgeon: Paralee Cancel, MD;  Location: [facility];  Service: Orthopedics;  Laterality: Left;  . HIP CLOSED REDUCTION Left 04/30/2016   Procedure: CLOSED REDUCTION HIP;  Surgeon: Wylene Simmer, MD;  Location: [facility];  Service: Orthopedics;  Laterality: Left;  . TONSILLECTOMY    . TOTAL HIP ARTHROPLASTY Right 10/15/2019   Procedure: TOTAL HIP ARTHROPLASTY ANTERIOR APPROACH;  Surgeon: Paralee Cancel, MD;  Location: [facility] ORS;  Service: Orthopedics;  Laterality: Right;  70 mins       Family History  Problem Relation Age of Onset  . Heart disease Mother 4  . Hypertension Mother   . Arthritis Mother   . Heart failure Father 65  . Stroke Maternal Aunt   . Heart failure Sister   . Colon cancer Neg Hx   . Esophageal cancer Neg Hx   . Stomach  cancer Neg Hx   . Rectal cancer Neg Hx     Social History   Tobacco Use  . Smoking status: Never Smoker  . Smokeless tobacco: Never Used  . Tobacco comment:    quit 20 years  smoked on and off for 20 years  Vaping Use  . Vaping Use: Never used  Substance Use Topics  . Alcohol use: Not Currently  . Drug use: Never    Home Medications Prior to Admission medications   Medication Sig Start Date End Date Taking? Authorizing Provider  acetaminophen (TYLENOL) 325 MG tablet Take 2 tablets (650 mg total) by mouth every 4 (four) hours as needed for headache or mild pain. 03/10/20  Yes Angiulli, Lavon Paganini, PA-C  amiodarone (PACERONE) 200 MG tablet Take 1 tablet (200 mg total) by mouth daily. 04/10/20  Yes Dunn, Dayna N, PA-C  amLODipine (NORVASC)  5 MG tablet Take 1 tablet (5 mg total) by mouth daily. 09/14/20  Yes Gherghe, Vella Redhead, MD  atorvastatin (LIPITOR) 20 MG tablet Take 1 tablet (20 mg total) by mouth daily. 03/10/20  Yes Angiulli, Lavon Paganini, PA-C  dapsone 100 MG tablet Take 100 mg by mouth daily. 09/29/20  Yes [provider]  ELIQUIS 5 MG TABS tablet TAKE 1 TABLET BY MOUTH TWICE DAILY. NEW CUSTOMER FOR [facility]. REQUESTING NEW PRESCRIPTION. Patient taking differently: Take 5 mg by mouth 2 (two) times daily. 08/12/20  Yes Sherren Mocha, MD  escitalopram (LEXAPRO) 10 MG tablet Take 1 tablet (10 mg total) by mouth daily. 03/10/20  Yes Angiulli, Lavon Paganini, PA-C  ferrous sulfate 325 (65 FE) MG tablet Take 1 tablet (325 mg total) by mouth 2 (two) times daily with a meal. Patient taking differently: Take 325 mg by mouth daily. 03/10/20 04/10/20 Yes Angiulli, Lavon Paganini, PA-C  folic acid (FOLVITE) 1 MG tablet Take 1 tablet (1 mg total) by mouth daily. 03/10/20  Yes Angiulli, Lavon Paganini, PA-C  ketoconazole (NIZORAL) 2 % cream Apply 1 application topically daily. To dry scaly skin on feet 07/23/20  Yes McDonald, Stephan Minister, DPM  loratadine (CLARITIN) 10 MG tablet Take 1 tablet (10 mg total) by mouth  daily. 02/12/20  Yes Medina-Vargas, Monina C, NP  Magnesium Oxide 400 MG CAPS Take 1 capsule (400 mg total) by mouth 2 (two) times daily. Patient taking differently: Take 400 mg by mouth daily. 05/04/20  Yes Dunn, Dayna N, PA-C  Multiple Vitamins-Minerals (CENTRUM SILVER 50+MEN) TABS Take 1 tablet by mouth daily with breakfast.   Yes [provider]  nitroGLYCERIN (NITROSTAT) 0.4 MG SL tablet Place 1 tablet (0.4 mg total) under the tongue every 5 (five) minutes x 3 doses as needed for chest pain. 07/28/20  Yes Baldwin [place], PA-C  pantoprazole (PROTONIX) 40 MG tablet Take 1 tablet (40 mg total) by mouth daily. 03/10/20  Yes Angiulli, Lavon Paganini, PA-C  potassium chloride SA (KLOR-CON) 20 MEQ tablet Take 2 tablets (40 mEq total) by mouth daily. Patient taking differently: Take 20 mEq by mouth daily. 08/03/20 07/29/21 Yes Sherren Mocha, MD  predniSONE (DELTASONE) 20 MG tablet Take 3 tablets (60 mg total) by mouth daily with breakfast. 09/13/20 10/13/20 Yes Gherghe, Vella Redhead, MD  tamsulosin (FLOMAX) 0.4 MG CAPS capsule Take 1 capsule (0.4 mg total) by mouth every evening. 03/10/20  Yes Angiulli, Lavon Paganini, PA-C  torsemide (DEMADEX) 20 MG tablet Take 60 mg by mouth See admin instructions. TAKE 3 TABLETS BY MOUTH EVERY MORNING, MAY TAKE A SECOND DOSE OF 3 TABLETS IN THE AFTERNOON AS NEEDED FOR SWELLING   Yes [provider]  traZODone (DESYREL) 50 MG tablet Take 1 tablet (50 mg total) by mouth at bedtime. 03/10/20  Yes Angiulli, Lavon Paganini, PA-C  furosemide (LASIX) 40 MG tablet Take 1 tablet (40 mg total) by mouth daily. Patient not taking: Reported on 10/13/2020 08/03/20 07/29/21  Sherren Mocha, MD  silver sulfADIAZINE (SILVADENE) 1 % cream Apply 1 application topically daily. Patient not taking: No sig reported 07/23/20   Criselda Peaches, DPM    Allergies    Penicillins and Penicillins  Review of Systems   Review of Systems  All other systems reviewed and are negative.   Physical  Exam Updated Vital Signs BP (!) 148/62   Pulse 68   Temp 97.6 F (36.4 C) (Oral)   Resp 14   SpO2 100%   Physical Exam Vitals  and nursing note reviewed.  Constitutional:      General: He is not in acute distress.    Appearance: He is well-developed.  HENT:     Head: Normocephalic and atraumatic.  Eyes:     Conjunctiva/sclera: Conjunctivae normal.  Cardiovascular:     Rate and Rhythm: Normal rate and regular rhythm.  Pulmonary:     Effort: Pulmonary effort is normal. No respiratory distress.     Breath sounds: Normal breath sounds.  Abdominal:     General: Bowel sounds are normal.     Palpations: Abdomen is soft.     Tenderness: There is no abdominal tenderness.  Skin:    General: Skin is warm.     Comments: 3+ pitting edema BLE. Dorsum of left foot with erythema and TTP, dehisced 1.5cm linear wound. Wound is weeping serous fluid. Plantar aspect of great toe on left foot with large deep ulcer. See images below.  Neurological:     Mental Status: He is alert.  Psychiatric:        Behavior: Behavior normal.         ED Results / Procedures / Treatments   Labs (all labs ordered are listed, but only abnormal results are displayed) Labs Reviewed  CBC WITH DIFFERENTIAL/PLATELET - Abnormal; Notable for the following components:      Result Value   WBC 16.3 (*)    RBC 2.43 (*)    Hemoglobin 7.7 (*)    HCT 25.2 (*)    MCV 103.7 (*)    Neutro Abs 14.6 (*)    Lymphs Abs 0.6 (*)    Abs Immature Granulocytes 0.14 (*)    All other components within normal limits  COMPREHENSIVE METABOLIC PANEL - Abnormal; Notable for the following components:   Sodium 134 (*)    Chloride 94 (*)    Glucose, Bld 115 (*)    BUN 126 (*)    Creatinine, Ser 3.37 (*)    Total Protein 5.8 (*)    Albumin 3.2 (*)    GFR, Estimated 18 (*)    All other components within normal limits  RESP PANEL BY RT-PCR (FLU A&B, COVID) ARPGX2    EKG None  Radiology DG Foot Complete Left  Result  Date: 10/13/2020 CLINICAL DATA:  Infection. Wound infection. Patient reports stitches on great toe. EXAM: LEFT FOOT - COMPLETE 3+ VIEW COMPARISON:  None. FINDINGS: Surgical hardware in the distal fibula and tibia, partially included. No acute fracture. Mild hammertoe deformity of the digits. 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 There is no erosion, bony destruction, periosteal reaction, or abnormal bone density to suggest osteomyelitis. Skin and soft tissue irregularity noted about the medial and plantar aspect of the great toe. Dorsal soft tissue edema with occasional skin irregularity. No radiopaque foreign body or tracking soft tissue air. IMPRESSION: 1. Skin and soft tissue irregularity about the medial and plantar aspect of the great toe. No radiographic findings of osteomyelitis. 2. Dorsal soft tissue edema. Electronically Signed   By: Keith Rake M.D.   On: 10/13/2020 19:39    Procedures Procedures   Medications Ordered in ED Medications  vancomycin (VANCOREADY) IVPB 2000 mg/400 mL (2,000 mg Intravenous New Bag/Given 10/13/20 2025)  cefTRIAXone (ROCEPHIN) 1 g in sodium chloride 0.9 % 100 mL IVPB (0 g Intravenous Stopped 10/13/20 2025)    ED Course  I have reviewed the triage vital signs and the nursing notes.  Pertinent labs & imaging results that were available during my care  of the patient were reviewed by me and considered in my medical decision making (see chart for details).    MDM Rules/Calculators/A&P                          Patient presenting for infected wounds of left foot.  He had a traumatic laceration to the dorsum of his foot about 2 weeks ago which was sutured in the ED.  He states it had good wound healing last week when sutures were removed.  It appears to have dehisced and had surrounding cellulitis, tenderness, warmth.  He also has ulcerated wound to the plantar aspect of the great toe, this is of unknown chronicity.  He has significant lower  extremity edema with history of CHF though patient reports this is markedly improved with 20 pound weight loss after starting torsemide.  No known history of diabetes.  He has leukocytosis here of 16.3, he is afebrile.  Renal function appears to be at baseline though BUN is elevated at 126, unclear etiology of this.  He is mentating appropriately.  Believe patient will need admission for IV antibiotics for this cellulitis and infected wound to the foot. Discussed with attending physician Dr Sabra Heck who is in agreement with care plan.  Discussed antibiotics with ED pharmacist due to PCN allergy, Rocephin and vanc ordered.   Final Clinical Impression(s) / ED Diagnoses Final diagnoses:  Cellulitis of left foot    Rx / DC Orders ED Discharge Orders    None       Netha Dafoe, [place] N, PA-C 10/13/20 2158    Noemi Chapel, MD 10/14/20 0700

## 2020-10-13 NOTE — ED Triage Notes (Signed)
Pt arrives sent by nephrologist for infected great L toe.

## 2020-10-13 NOTE — H&P (Signed)
History and Physical    Darryl Diaz DGL:[id number] DOB: January 30, 1941 DOA: 10/13/2020  PCP: Darryl Pao, MD   Patient coming from: Home  Chief Complaint: wound on left foot with infection  HPI: Darryl Diaz is a 80 y.o. male with medical history significant for paroxysmal atrial fibrillation on Eliquis, hypertension, HFpEF, CKD stage IV, chronic anemia who presents for evaluation of left foot wound on the bottom of his great toe and dorsal aspect of his left midfoot.  He reports that he dropped a statue on top of the left foot and had sutures placed in the emergency room on Sep 26, 2020.  He was unaware that he had an ulceration of the plantar surface of his great toe on the left foot.  States he has had a callus on the bottom of his toe and he reports he has been walking normally.  He states he usually does use a walker at home to ambulate.  He has not had any pain in his left first toe.  He has had increasing erythema over the last few days of his left foot and his left foot is warm to touch and painful to touch.  He has had some drainage from the dorsal wound.  He does have chronic pitting edema of his lower legs but states he has lost 20 pounds in the last 2 weeks since his diuretic was changed to torsemide.  States he has not had any fever or chills.  He is otherwise been in his normal state of health with no chest pain, palpitations, shortness of breath, cough or urinary symptoms.   ED Course: Darryl Diaz is hemodynamically stable.  He was found to have an ulceration on the plantar surface of his left great toe that is deep into the underlying soft tissue.  There is no purulent drainage.  X-ray showed no signs of osteomyelitis.  Patient is unsure of how long he has had the ulceration.  He also has a 1.5 cm laceration to the dorsal aspect of his left midfoot with a serous drainage.  Entire left foot is warm to touch and erythematous.  WBC 16.3, hemoglobin 7.7, hematocrit 25.2, platelets  225,000, 134, potassium 3.5, chloride 94, bicarb 28, glucose 115, BUN 126, creatinine 3.37. Baseline creatinine is 3.25-3.50  Review of Systems:  General: Denies weakness, fever, chills, weight loss, night sweats. Denies dizziness. Denies change in appetite HENT: Denies head trauma, headache, denies change in hearing, tinnitus.  Denies nasal bleeding.  Denies sore throat.  Denies difficulty swallowing Eyes: Denies blurry vision, pain in eye, drainage. Denies discoloration of eyes. Neck: Denies pain.  Denies swelling.  Denies pain with movement. Cardiovascular: Denies chest pain, palpitations. Has chronic leg edema.  Denies orthopnea Respiratory: Denies shortness of breath, cough. Denies wheezing. Denies sputum production Gastrointestinal: Denies abdominal pain, swelling.  Denies nausea, vomiting, diarrhea.  Denies melena.  Denies hematemesis. Musculoskeletal: Denies limitation of movement. Denies arthralgias or myalgias. Genitourinary: Denies pelvic pain.  Denies urinary frequency or hesitancy.  Denies dysuria.  Skin: Denies rash.  Denies petechiae, purpura, ecchymosis. Neurological: Denies syncope. Denies seizure activity. Denies paresthesia.  Denies slurred speech, drooping face. Denies visual change. Psychiatric: Denies depression, anxiety. Denies hallucinations.  Past Medical History:  Diagnosis Date  . 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 Anemia   . 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 Bradycardia    low 20's  . Bradycardic cardiac arrest ([place])   . Cardiac arrest (  [place]) 04/20/2019   12 min CPR with epi  . Chronic diastolic CHF (congestive heart failure) ([place])   . Fall at home 03/28/2016  . Fatty liver   . Gastritis   . GERD (gastroesophageal reflux disease)   . H/O seasonal allergies   . History of GI bleed   . Hx of adenomatous colonic polyps 08/10/2017  . Hyperlipidemia   . Hypertension   . Hypotension   . Insomnia   . 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 Mallory-Weiss tear   . OA (osteoarthritis)    hands  . OSA (obstructive sleep apnea)    No longer uses CPAP  . Persistent atrial fibrillation with rapid ventricular response ([place]) 02/05/2020  . Recurrent syncope   . 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 ANKLE SURGERY Left 2014   had rods put in   . BIOPSY  02/28/2019   Procedure: BIOPSY;  Surgeon: Milus Banister, MD;  Location: [facility] ENDOSCOPY;  Service: Endoscopy;;  . CARDIOVERSION  02/06/2020  . CARDIOVERSION N/A 02/06/2020   Procedure: CARDIOVERSION;  Surgeon: Werner Lean, MD;  Location: [facility] ENDOSCOPY;  Service: Cardiovascular;  Laterality: N/A;  . CARDIOVERSION N/A 02/24/2020   Procedure: CARDIOVERSION;  Surgeon: Werner Lean, MD;  Location: [place] ENDOSCOPY;  Service: Cardiovascular;  Laterality: N/A;  . [place]  . ESOPHAGOGASTRODUODENOSCOPY (EGD) WITH PROPOFOL N/A 02/28/2019   Procedure: ESOPHAGOGASTRODUODENOSCOPY (EGD) WITH PROPOFOL;  Surgeon: Milus Banister, MD;  Location: [facility] ENDOSCOPY;  Service: Endoscopy;  Laterality: N/A;  . HIP ARTHROPLASTY Left 04/01/2016   Procedure: ARTHROPLASTY BIPOLAR HIP (HEMIARTHROPLASTY);  Surgeon: Paralee Cancel, MD;  Location: [place];  Service: Orthopedics;  Laterality: Left;  . HIP CLOSED REDUCTION Left 04/30/2016   Procedure: CLOSED REDUCTION HIP;  Surgeon: Wylene Simmer, MD;  Location: [place];  Service: Orthopedics;  Laterality: Left;  . TONSILLECTOMY    . TOTAL HIP ARTHROPLASTY Right 10/15/2019   Procedure: TOTAL HIP ARTHROPLASTY ANTERIOR APPROACH;  Surgeon: Paralee Cancel, MD;  Location: [facility] ORS;  Service: Orthopedics;  Laterality: Right;  70 mins    Social History  reports that he has never smoked. He has never used smokeless tobacco. He reports previous alcohol use. He reports that he does not use drugs.  Allergies  Allergen Reactions  . Penicillins Other (See Comments)    Tolerated  Cephalosporin Date: 10/15/19. Allergy is from childhood Did it involve swelling of the face/tongue/throat, SOB, or low BP? Unknown Did it involve sudden or severe rash/hives, skin peeling, or any reaction on the inside of your mouth or nose? Unknown Did you need to seek medical attention at a hospital or doctor's office? Unknown When did it last happen?childhood reaction If all above answers are "NO", may proceed with cephalosporin use.  Marland Kitchen Penicillins     Family History  Problem Relation Age of Onset  . Heart disease Mother 4  . Hypertension Mother   . Arthritis Mother   . Heart failure Father 36  . Stroke Maternal Aunt   . Heart failure Sister   . Colon cancer Neg Hx   . Esophageal cancer Neg Hx   . Stomach cancer Neg Hx   . Rectal cancer Neg Hx      Prior to Admission medications   Medication Sig Start Date End Date Taking? Authorizing Provider  acetaminophen (TYLENOL) 325 MG tablet Take 2 tablets (650 mg total) by mouth every  4 (four) hours as needed for headache or mild pain. 03/10/20   Angiulli, Lavon Paganini, PA-C  amiodarone (PACERONE) 200 MG tablet Take 1 tablet (200 mg total) by mouth daily. 04/10/20   Dunn, Nedra Hai, PA-C  amLODipine (NORVASC) 5 MG tablet Take 1 tablet (5 mg total) by mouth daily. 09/14/20   Caren Griffins, MD  atorvastatin (LIPITOR) 20 MG tablet Take 1 tablet (20 mg total) by mouth daily. 03/10/20   Angiulli, Lavon Paganini, PA-C  ELIQUIS 5 MG TABS tablet TAKE 1 TABLET BY MOUTH TWICE DAILY. NEW CUSTOMER FOR [facility]. REQUESTING NEW PRESCRIPTION. Patient taking differently: Take 5 mg by mouth 2 (two) times daily. 08/12/20   Sherren Mocha, MD  escitalopram (LEXAPRO) 10 MG tablet Take 1 tablet (10 mg total) by mouth daily. 03/10/20   Angiulli, Lavon Paganini, PA-C  ferrous sulfate 325 (65 FE) MG tablet Take 1 tablet (325 mg total) by mouth 2 (two) times daily with a meal. 03/10/20 04/10/20  Angiulli, Lavon Paganini, PA-C  folic acid (FOLVITE) 1 MG tablet Take 1 tablet (1 mg  total) by mouth daily. 03/10/20   Angiulli, Lavon Paganini, PA-C  furosemide (LASIX) 40 MG tablet Take 1 tablet (40 mg total) by mouth daily. 08/03/20 07/29/21  Sherren Mocha, MD  ketoconazole (NIZORAL) 2 % cream Apply 1 application topically daily. To dry scaly skin on feet Patient not taking: No sig reported 07/23/20   Criselda Peaches, DPM  loratadine (CLARITIN) 10 MG tablet Take 1 tablet (10 mg total) by mouth daily. 02/12/20   Medina-Vargas, Monina C, NP  Magnesium Oxide 400 MG CAPS Take 1 capsule (400 mg total) by mouth 2 (two) times daily. 05/04/20   Dunn, Nedra Hai, PA-C  Multiple Vitamins-Minerals (CENTRUM SILVER 50+MEN) TABS Take 1 tablet by mouth daily with breakfast.    [provider]  nitroGLYCERIN (NITROSTAT) 0.4 MG SL tablet Place 1 tablet (0.4 mg total) under the tongue every 5 (five) minutes x 3 doses as needed for chest pain. 07/28/20   Baldwin Jamaica, PA-C  pantoprazole (PROTONIX) 40 MG tablet Take 1 tablet (40 mg total) by mouth daily. 03/10/20   Angiulli, Lavon Paganini, PA-C  potassium chloride SA (KLOR-CON) 20 MEQ tablet Take 2 tablets (40 mEq total) by mouth daily. Patient taking differently: Take 20 mEq by mouth 2 (two) times daily. 08/03/20 07/29/21  Sherren Mocha, MD  predniSONE (DELTASONE) 20 MG tablet Take 3 tablets (60 mg total) by mouth daily with breakfast. 09/13/20 10/13/20  Caren Griffins, MD  silver sulfADIAZINE (SILVADENE) 1 % cream Apply 1 application topically daily. Patient not taking: No sig reported 07/23/20   Criselda Peaches, DPM  tamsulosin (FLOMAX) 0.4 MG CAPS capsule Take 1 capsule (0.4 mg total) by mouth every evening. 03/10/20   Angiulli, Lavon Paganini, PA-C  traZODone (DESYREL) 50 MG tablet Take 1 tablet (50 mg total) by mouth at bedtime. 03/10/20   Angiulli, Lavon Paganini, PA-C  triamcinolone ointment (KENALOG) 0.1 % Apply 1 application topically 2 (two) times daily. legs 08/20/20   [provider]    Physical Exam: Vitals:   10/13/20 1719 10/13/20 1815  10/13/20 2000 10/13/20 2015  BP: 139/63 135/79 (!) 148/68 (!) 153/66  Pulse: 64 67 69 70  Resp: 15 14    Temp:      TempSrc:      SpO2: 100% 98% 97% 99%    Constitutional: NAD, calm, comfortable Vitals:   10/13/20 1719 10/13/20 1815 10/13/20 2000 10/13/20 2015  BP: 139/63 135/79 (!) 148/68 (!) 153/66  Pulse: 64 67 69 70  Resp: 15 14    Temp:      TempSrc:      SpO2: 100% 98% 97% 99%   General: WDWN, Alert and oriented x3.  Eyes: EOMI, PERRL,  conjunctivae normal. Sclera nonicteric HENT:  [place]/AT, external ears normal.  Nares patent without epistasis.  Mucous membranes are moist.  Neck: Soft, normal range of motion, supple, Trachea midline Respiratory: clear to auscultation bilaterally, no wheezing, no crackles. Normal respiratory effort. No accessory muscle use.  Cardiovascular: Irregular rhythm, normal rate. no murmurs / rubs / gallops. Has bilateral 2+ lower extremity edema. Abdomen: Soft, no tenderness, nondistended, no rebound or guarding. No masses palpated. Bowel sounds normoactive Musculoskeletal: FROM. no cyanosis. Normal muscle tone.  Skin: Warm, dry, intact no rashes. Left Great toe with plantar ulcer into underlying soft tissue with clear drainage, has 1.5 cm laceration dorsal left midfoot into underlying soft tissue with serous drainage. Has erythema of left foot that is warm to touch.  Left foot tender to touch. Neurologic: CN 2-12 grossly intact. Normal speech.  Decreased sensation to light touch in distal left foot and toes. strength 4/5 in all extremities.   Psychiatric: Normal judgment and insight.  Normal mood.    Labs on Admission: I have personally reviewed following labs and imaging studies  CBC: Recent Labs  Lab 10/13/20 1218  WBC 16.3*  NEUTROABS 14.6*  HGB 7.7*  HCT 25.2*  MCV 103.7*  PLT 284    Basic Metabolic Panel: Recent Labs  Lab 10/13/20 1218  NA 134*  K 3.5  CL 94*  CO2 28  GLUCOSE 115*  BUN 126*  CREATININE 3.37*  CALCIUM 9.2     GFR: Estimated Creatinine Clearance: 23.1 mL/min (A) (by C-G formula based on SCr of 3.37 mg/dL (H)).  Liver Function Tests: Recent Labs  Lab 10/13/20 1218  AST 21  ALT 29  ALKPHOS 64  BILITOT 0.6  PROT 5.8*  ALBUMIN 3.2*    Urine analysis:    Component Value Date/Time   COLORURINE YELLOW 09/08/2020 1818   APPEARANCEUR HAZY (A) 09/08/2020 1818   LABSPEC 1.005 09/08/2020 1818   PHURINE 6.0 09/08/2020 1818   GLUCOSEU NEGATIVE 09/08/2020 1818   HGBUR LARGE (A) 09/08/2020 1818   BILIRUBINUR NEGATIVE 09/08/2020 1818   [place] NEGATIVE 09/08/2020 1818   PROTEINUR 100 (A) 09/08/2020 1818   NITRITE NEGATIVE 09/08/2020 1818   LEUKOCYTESUR NEGATIVE 09/08/2020 1818    Radiological Exams on Admission: DG Foot Complete Left  Result Date: 10/13/2020 CLINICAL DATA:  Infection. Wound infection. Patient reports stitches on great toe. EXAM: LEFT FOOT - COMPLETE 3+ VIEW COMPARISON:  None. FINDINGS: Surgical hardware in the distal fibula and tibia, partially included. No acute fracture. Mild hammertoe deformity of the digits. 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 There is no erosion, bony destruction, periosteal reaction, or abnormal bone density to suggest osteomyelitis. Skin and soft tissue irregularity noted about the medial and plantar aspect of the great toe. Dorsal soft tissue edema with occasional skin irregularity. No radiopaque foreign body or tracking soft tissue air. IMPRESSION: 1. Skin and soft tissue irregularity about the medial and plantar aspect of the great toe. No radiographic findings of osteomyelitis. 2. Dorsal soft tissue edema. Electronically Signed   By: Keith Rake M.D.   On: 10/13/2020 19:39   Assessment/Plan Principal Problem:   Cellulitis of great toe of left foot Mr. Kindt is admitted to Med/Surg  floor.  Started on Cefepime and Vancomycin for antibiotic coverage. Discussed antibiotic choice with Pharmacy and with moderate  nonpurulent infection of wound.  Has laceration on top of left foot after recent injury from dropping an object on his foot.  Will obtain MRI foot to rule out osteomyelitis.  Obtain ABI to assess vascular status of patient Discussed with Podiatry, Dr. Posey Pronto, who will see in am and possible take to OR for debridement tomorrow depending on MRI and ABI results.   Active Problems:   Skin ulcer of left great toe  Consult wound care Podiatry to see in am.    PAF (paroxysmal atrial fibrillation)  Chronic. Continue home meds. Is on Eliquis    Benign essential HTN Continue home medications of norvasc    CKD (chronic kidney disease) stage 4, GFR 15-29 ml/min  Chronic, stable    Anemia, chronic disease Chronic     Chronic heart failure with preserved ejection fraction (HFpEF)  Continue Torsemide.     Chronic anticoagulation On Eliquis    DVT prophylaxis: Is anticoagulated on Eliquis which is continued.  Code Status:   Full Code  Family Communication:  Diagnosis and plan discussed with patient. He verbalizes understanding and agrees with plan.  Further recommendations to follow as clinically indicated Disposition Plan:   Patient is from:  Home  Anticipated DC to:  Home  Anticipated DC date:  Anticipate 2 midnight or more stay in the hospital  Anticipated DC barriers: No barriers to discharge identified at this time  Consults called:  Dr. Posey Pronto, Podiatry.  Admission status:  Inpatient.  Yevonne Aline Tangala Wiegert MD [facility]  How to contact the [facility] Attending or Consulting provider [place] or covering provider during after hours [place], for this patient?   1. Check the care team in [facility] and look for a) attending/consulting [facility] provider listed and b) the [facility] team listed 2. Log into [email or web address] and use [place]'s universal password to access. If you do not have the password, please contact the hospital operator. 3. Locate the [facility] provider you are looking for under [facility] and page to a number that you can be directly reached. 4. If you still have difficulty reaching the provider, please page the [facility] (Director on Call) for the Hospitalists listed on amion for assistance.  10/13/2020, 8:39 PM

## 2020-10-14 ENCOUNTER — Inpatient Hospital Stay (HOSPITAL_COMMUNITY): Payer: Medicare Other

## 2020-10-14 DIAGNOSIS — Z7901 Long term (current) use of anticoagulants: Secondary | ICD-10-CM

## 2020-10-14 DIAGNOSIS — L03032 Cellulitis of left toe: Secondary | ICD-10-CM

## 2020-10-14 DIAGNOSIS — I5032 Chronic diastolic (congestive) heart failure: Secondary | ICD-10-CM

## 2020-10-14 DIAGNOSIS — N184 Chronic kidney disease, stage 4 (severe): Secondary | ICD-10-CM

## 2020-10-14 DIAGNOSIS — I10 Essential (primary) hypertension: Secondary | ICD-10-CM

## 2020-10-14 DIAGNOSIS — D638 Anemia in other chronic diseases classified elsewhere: Secondary | ICD-10-CM

## 2020-10-14 LAB — CBC
HCT: 23 % — ABNORMAL LOW (ref 39.0–52.0)
Hemoglobin: 7.2 g/dL — ABNORMAL LOW (ref 13.0–17.0)
MCH: 31.9 pg (ref 26.0–34.0)
MCHC: 31.3 g/dL (ref 30.0–36.0)
MCV: 101.8 fL — ABNORMAL HIGH (ref 80.0–100.0)
Platelets: 191 10*3/uL (ref 150–400)
RBC: 2.26 MIL/uL — ABNORMAL LOW (ref 4.22–5.81)
RDW: 14.2 % (ref 11.5–15.5)
WBC: 14.9 10*3/uL — ABNORMAL HIGH (ref 4.0–10.5)
nRBC: 0 % (ref 0.0–0.2)

## 2020-10-14 LAB — BASIC METABOLIC PANEL
Anion gap: 13 (ref 5–15)
BUN: 120 mg/dL — ABNORMAL HIGH (ref 8–23)
CO2: 28 mmol/L (ref 22–32)
Calcium: 8.9 mg/dL (ref 8.9–10.3)
Chloride: 96 mmol/L — ABNORMAL LOW (ref 98–111)
Creatinine, Ser: 3.24 mg/dL — ABNORMAL HIGH (ref 0.61–1.24)
GFR, Estimated: 19 mL/min — ABNORMAL LOW (ref >60)
Glucose, Bld: 213 mg/dL — ABNORMAL HIGH (ref 70–99)
Potassium: 3.6 mmol/L (ref 3.5–5.1)
Sodium: 137 mmol/L (ref 135–145)

## 2020-10-14 LAB — PREPARE RBC (CROSSMATCH)

## 2020-10-14 LAB — GLUCOSE, CAPILLARY: Glucose-Capillary: 166 mg/dL — ABNORMAL HIGH (ref 70–99)

## 2020-10-14 MED ORDER — SODIUM CHLORIDE 0.9% IV SOLUTION: Freq: Once | INTRAVENOUS | Status: DC

## 2020-10-14 MED ORDER — PREDNISONE 20 MG PO TABS
40.0000 mg | ORAL_TABLET | Freq: Every day | ORAL | Status: DC
  Administered 2020-10-15 – 2020-10-17 (×3): 40 mg via ORAL
  Filled 2020-10-14 (×3): qty 2

## 2020-10-14 MED ORDER — TORSEMIDE 20 MG PO TABS
20.0000 mg | ORAL_TABLET | Freq: Two times a day (BID) | ORAL | Status: DC
  Administered 2020-10-14 – 2020-10-17 (×6): 20 mg via ORAL
  Filled 2020-10-14 (×7): qty 1

## 2020-10-14 MED ORDER — HEPARIN SODIUM (PORCINE) 5000 UNIT/ML IJ SOLN
5000.0000 [IU] | Freq: Three times a day (TID) | INTRAMUSCULAR | Status: DC
  Administered 2020-10-14 – 2020-10-16 (×6): 5000 [IU] via SUBCUTANEOUS
  Filled 2020-10-14 (×6): qty 1

## 2020-10-14 NOTE — Progress Notes (Signed)
Pt back from MRI, [place] 15, pt denies discomfort, pt wanting to eat. Pt acknowledge waiting for mri results to know further plan of tx. Dorsal side left foot dressing with small amount bloody drng

## 2020-10-14 NOTE — Consult Note (Addendum)
  Subjective:  Patient ID: Darryl Diaz, male    DOB: Dec 20, 1940,  MRN: [id number]  A 80 y.o. male with medical history significant for paroxysmal atrial fibrillation on Eliquis, hypertension, HFpEF, CKD stage IV, chronic anemia who presents for evaluation of left foot wound on the bottom of his great toe and dorsal aspect of his left midfoot.  Patient states that he dropped something on top of the foot that led to the wound on top of the midfoot.  He states that he does not have much feeling in the great toe therefore did not realize it was turning into an ulcer.  He states it got more painful and warm to touch.  Over the last few days has progressive gotten worse.  He denies any nausea fever chills vomiting shortness of breath. Objective:   Vitals:   10/14/20 0042 10/14/20 0530  BP: 136/67 (!) 148/65  Pulse: 70 69  Resp: 18 18  Temp: 98.7 F (37.1 C) 98.4 F (36.9 C)  SpO2: 97% 97%   General AA&O x3. Normal mood and affect.  Vascular Dorsalis pedis and posterior tibial pulses nonpalpable Diminished capillary refill to all digits. Pedal hair not present.  Neurologic Epicritic sensation grossly intact.  Dermatologic  left dorsal midfoot wound does not probe down to bone.  Probes down to deep tissue.  No exposure of tendon.  Fiber granular wound base.  No malodor present no purulent drainage expressed.  Left hallux wound probing down to deep tissue not down to bone.  No purulent drainage noted.  Redness noted up to the midfoot.  Orthopedic: MMT 5/5 in dorsiflexion, plantarflexion, inversion, and eversion. Normal joint ROM without pain or crepitus.       1. Skin and soft tissue irregularity about the medial and plantar aspect of the great toe. No radiographic findings of osteomyelitis. 2. Dorsal soft tissue edema. Assessment & Plan:  Patient was evaluated and treated and all questions answered.  Left dorsal midfoot wound and left hallux wound probing down to deep tissue -All  questions and concerns were addressed at bedside -Radiographs were negative for osteomyelitis.  However given the nature of the wounds I believe patient will be beneficial with an MRI.  Awaiting MRI findings. -Patient may need an OR intervention if positive for osteomyelitis.  If negative for osteomyelitis we will plan on just doing local wound care and follow-up in an outpatient setting. -Continue IV antibiotics -Local wound care with Betadine wet-to-dry -Partial weightbearing to the heel with Darco wedge shoe  Felipa Furnace, DPM  Accessible via secure chat for questions or concerns.

## 2020-10-14 NOTE — Progress Notes (Signed)
PROGRESS NOTE    Darryl Diaz  WJX:[id number] DOB: 28-Aug-1940 DOA: 10/13/2020 PCP: [name], MD    Brief Narrative:  80 year old male with a history of chronic kidney disease stage IV, paroxysmal atrial fibrillation on anticoagulation, hypertension, diastolic heart failure, admitted to the hospital with left foot wound.  He is currently on IV antibiotics.  Currently, MRI pending to evaluate for underlying osteomyelitis.  Podiatry following.   Assessment & Plan:   Principal Problem:   Cellulitis of great toe of left foot Active Problems:   PAF (paroxysmal atrial fibrillation) (HCC)   Anemia, chronic disease   Benign essential HTN   Chronic anticoagulation   Skin ulcer of left great toe (HCC)   CKD (chronic kidney disease) stage 4, GFR 15-29 ml/min (HCC)   Chronic heart failure with preserved ejection fraction (HFpEF) (HCC)   Cellulitis of left foot -No obvious bony process on plain films -MRI foot is pending -Currently on intravenous antibiotics -Podiatry following for possible operative management if needed -ABIs have been ordered  Paroxysmal atrial fibrillation -He is chronically on Eliquis, will hold for now in case operative management is needed -Heart rate is currently stable -Continue on amiodarone  Hypertension -He was on Norvasc, but this was stopped recently due to soft blood pressures and concerned that it was contributing to his lower extremity edema.  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 Joelyn Oms recommended changing prednisone from 60 mg to 40 mg daily for the next 2 weeks and then 20 mg daily thereafter -Creatinine currently appears to be near baseline -BUN elevated in 120s -no symptoms of uremia at present -no reported GI bleeding -continue to follow  Anemia of chronic disease -Hemoglobin is low at 7.2 -discussed with Dr. Joelyn Oms and he would support  keeping Hgb above 8, considering his multiple co morbidities and possible need for surgery -will transfuse 1 unit prbc -Continue to follow  Chronic diastolic congestive heart failure -Reports losing 20 pounds in the past 2 weeks since diuretics have been changed to torsemide -Will continue torsemide 20 mg twice daily since he still has evidence of persistent edema      DVT prophylaxis:  apixaban (ELIQUIS) tablet 5 mg  Code Status: full code Family Communication: Patient has requested his sister [name] be primary contact. discussed with sister [name] over the phone Disposition Plan: Status is: Inpatient  Remains inpatient appropriate because:Ongoing diagnostic testing needed not appropriate for outpatient work up, IV treatments appropriate due to intensity of illness or inability to take PO and Inpatient level of care appropriate due to severity of illness   Dispo: The patient is from: Home              Anticipated d/c is to: Home              Patient currently is not medically stable to d/c.   Difficult to place patient No         Consultants:   Podiatry  Procedures:     Antimicrobials:   Cefepime 6/7>   Vancomycin 6/7 >   Subjective: Denies any pain, no blood in stools, he is anxious to get his MRI  Objective: Vitals:   10/13/20 2223 10/14/20 0042 10/14/20 0530 10/14/20 0827  BP: 135/65 136/67 (!) 148/65 139/74  Pulse: 64 70 69 67  Resp: 17 18 18 18   Temp: 97.7 F (36.5 C) 98.7 F (37.1 C) 98.4 F (36.9 C) 98.9  F (37.2 C)  TempSrc: Oral Oral Oral Oral  SpO2: 100% 97% 97% 97%    Intake/Output Summary (Last 24 hours) at 10/14/2020 1201 Last data filed at 10/14/2020 0648 Gross per 24 hour  Intake 501.19 ml  Output 1000 ml  Net -498.81 ml   There were no vitals filed for this visit.  Examination:  General exam: Appears calm and comfortable  Respiratory system: Clear to auscultation. Respiratory effort normal. Cardiovascular system: S1 & S2 heard,  RRR. No JVD, murmurs, rubs, gallops or clicks.  Gastrointestinal system: Abdomen is nondistended, soft and nontender. No organomegaly or masses felt. Normal bowel sounds heard. Central nervous system: Alert and oriented. No focal neurological deficits. Extremities: left foot is wrapped in dressing, 2+ edema in LE bilaterally Skin: No rashes, lesions or ulcers Psychiatry: Judgement and insight appear normal. Mood & affect appropriate.     Data Reviewed: I have personally reviewed following labs and imaging studies  CBC: Recent Labs  Lab 10/13/20 1218 10/14/20 0304  WBC 16.3* 14.9*  NEUTROABS 14.6*  --   HGB 7.7* 7.2*  HCT 25.2* 23.0*  MCV 103.7* 101.8*  PLT 225 937   Basic Metabolic Panel: Recent Labs  Lab 10/13/20 1218 10/14/20 0304  NA 134* 137  K 3.5 3.6  CL 94* 96*  CO2 28 28  GLUCOSE 115* 213*  BUN 126* 120*  CREATININE 3.37* 3.24*  CALCIUM 9.2 8.9   GFR: Estimated Creatinine Clearance: 24 mL/min (A) (by C-G formula based on SCr of 3.24 mg/dL (H)). Liver Function Tests: Recent Labs  Lab 10/13/20 1218  AST 21  ALT 29  ALKPHOS 64  BILITOT 0.6  PROT 5.8*  ALBUMIN 3.2*   No results for input(s): LIPASE, AMYLASE in the last 168 hours. No results for input(s): AMMONIA in the last 168 hours. Coagulation Profile: No results for input(s): INR, PROTIME in the last 168 hours. Cardiac Enzymes: No results for input(s): CKTOTAL, CKMB, CKMBINDEX, TROPONINI in the last 168 hours. BNP (last 3 results) No results for input(s): PROBNP in the last 8760 hours. HbA1C: No results for input(s): HGBA1C in the last 72 hours. CBG: No results for input(s): GLUCAP in the last 168 hours. Lipid Profile: No results for input(s): CHOL, HDL, LDLCALC, TRIG, CHOLHDL, LDLDIRECT in the last 72 hours. Thyroid Function Tests: No results for input(s): TSH, T4TOTAL, FREET4, T3FREE, THYROIDAB in the last 72 hours. Anemia Panel: No results for input(s): VITAMINB12, FOLATE, FERRITIN, TIBC,  IRON, RETICCTPCT in the last 72 hours. Sepsis Labs: No results for input(s): PROCALCITON, LATICACIDVEN in the last 168 hours.  Recent Results (from the past 240 hour(s))  Resp Panel by RT-PCR (Flu A&B, Covid) Nasopharyngeal Swab     Status: None   Collection Time: 10/13/20 12:01 PM   Specimen: Nasopharyngeal Swab; Nasopharyngeal(NP) swabs in vial transport medium  Result Value Ref Range Status   SARS Coronavirus 2 by RT PCR NEGATIVE NEGATIVE Final    Comment: (NOTE) SARS-CoV-2 target nucleic acids are NOT DETECTED.  The SARS-CoV-2 RNA is generally detectable in upper respiratory specimens during the acute phase of infection. The lowest concentration of SARS-CoV-2 viral copies this assay can detect is 138 copies/mL. A negative result does not preclude SARS-Cov-2 infection and should not be used as the sole basis for treatment or other patient management decisions. A negative result may occur with  improper specimen collection/handling, submission of specimen other than nasopharyngeal swab, presence of viral mutation(s) within the areas targeted by this assay, and inadequate number of viral  copies(<138 copies/mL). A negative result must be combined with clinical observations, patient history, and epidemiological information. The expected result is Negative.  Fact Sheet for Patients:  EntrepreneurPulse.com.au  Fact Sheet for Healthcare Providers:  IncredibleEmployment.be  This test is no t yet approved or cleared by the [place] FDA and  has been authorized for detection and/or diagnosis of SARS-CoV-2 by FDA under an Emergency Use Authorization (EUA). This EUA will remain  in effect (meaning this test can be used) for the duration of the COVID-19 declaration under Section 564(b)(1) of the Act, 21 U.S.C.section 360bbb-3(b)(1), unless the authorization is terminated  or revoked sooner.       Influenza A by PCR NEGATIVE NEGATIVE Final    Influenza B by PCR NEGATIVE NEGATIVE Final    Comment: (NOTE) The Xpert Xpress SARS-CoV-2/FLU/RSV plus assay is intended as an aid in the diagnosis of influenza from Nasopharyngeal swab specimens and should not be used as a sole basis for treatment. Nasal washings and aspirates are unacceptable for Xpert Xpress SARS-CoV-2/FLU/RSV testing.  Fact Sheet for Patients: EntrepreneurPulse.com.au  Fact Sheet for Healthcare Providers: IncredibleEmployment.be  This test is not yet approved or cleared by the [place] FDA and has been authorized for detection and/or diagnosis of SARS-CoV-2 by FDA under an Emergency Use Authorization (EUA). This EUA will remain in effect (meaning this test can be used) for the duration of the COVID-19 declaration under Section 564(b)(1) of the Act, 21 U.S.C. section 360bbb-3(b)(1), unless the authorization is terminated or revoked.  Performed at [facility], [place] [address], [place],  [address]          Radiology Studies: DG Foot Complete Left  Result Date: 10/13/2020 CLINICAL DATA:  Infection. Wound infection. Patient reports stitches on great toe. EXAM: LEFT FOOT - COMPLETE 3+ VIEW COMPARISON:  None. FINDINGS: Surgical hardware in the distal fibula and tibia, partially included. No acute fracture. Mild hammertoe deformity of the digits. 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 There is no erosion, bony destruction, periosteal reaction, or abnormal bone density to suggest osteomyelitis. Skin and soft tissue irregularity noted about the medial and plantar aspect of the great toe. Dorsal soft tissue edema with occasional skin irregularity. No radiopaque foreign body or tracking soft tissue air. IMPRESSION: 1. Skin and soft tissue irregularity about the medial and plantar aspect of the great toe. No radiographic findings of osteomyelitis. 2. Dorsal soft tissue edema. Electronically Signed    By: Keith Rake M.D.   On: 10/13/2020 19:39        Scheduled Meds: . amiodarone  200 mg Oral Daily  . amLODipine  5 mg Oral Daily  . apixaban  5 mg Oral BID  . atorvastatin  20 mg Oral Daily  . multivitamin with minerals  1 tablet Oral Daily  . potassium chloride SA  20 mEq Oral BID  . predniSONE  60 mg Oral Q breakfast  . sodium chloride flush  3 mL Intravenous Q12H  . tamsulosin  0.4 mg Oral QPM  . torsemide  20 mg Oral Daily  . traZODone  50 mg Oral QHS   Continuous Infusions: . sodium chloride    . ceFEPime (MAXIPIME) IV 2 g (10/14/20 0057)     LOS: 1 day    Time spent: 56mins    Kathie Dike, MD [facility]   If 7PM-7AM, please contact night-coverage [email or web address]  10/14/2020, 12:01 PM

## 2020-10-14 NOTE — Consult Note (Signed)
WOC Nurse Consult Note: Patient receiving care in [place]. Reason for Consult: left great toe ulcer and cellulitis Podiatrist Boneta Lucks consulted on the patient this morning at 0747 and is now directing care for this wound.  Please direct all questions to Dr. Keith Rake, Podiatrist. [place] nurse did not see and will not follow. [name], RN, MSN, CWOCN, CNS-BC, pager [phone]

## 2020-10-15 ENCOUNTER — Inpatient Hospital Stay (HOSPITAL_COMMUNITY): Payer: Medicare Other

## 2020-10-15 DIAGNOSIS — L0390 Cellulitis, unspecified: Secondary | ICD-10-CM

## 2020-10-15 LAB — CBC
HCT: 22.9 % — ABNORMAL LOW (ref 39.0–52.0)
HCT: 25.9 % — ABNORMAL LOW (ref 39.0–52.0)
Hemoglobin: 7.4 g/dL — ABNORMAL LOW (ref 13.0–17.0)
Hemoglobin: 8.2 g/dL — ABNORMAL LOW (ref 13.0–17.0)
MCH: 31.9 pg (ref 26.0–34.0)
MCH: 31.9 pg (ref 26.0–34.0)
MCHC: 32.3 g/dL (ref 30.0–36.0)
MCHC: 31.7 g/dL (ref 30.0–36.0)
MCV: 98.7 fL (ref 80.0–100.0)
MCV: 100.8 fL — ABNORMAL HIGH (ref 80.0–100.0)
Platelets: 172 10*3/uL (ref 150–400)
Platelets: 205 10*3/uL (ref 150–400)
RBC: 2.32 MIL/uL — ABNORMAL LOW (ref 4.22–5.81)
RBC: 2.57 MIL/uL — ABNORMAL LOW (ref 4.22–5.81)
RDW: 14.3 % (ref 11.5–15.5)
RDW: 14.6 % (ref 11.5–15.5)
WBC: 10.7 10*3/uL — ABNORMAL HIGH (ref 4.0–10.5)
WBC: 11 10*3/uL — ABNORMAL HIGH (ref 4.0–10.5)
nRBC: 0 % (ref 0.0–0.2)
nRBC: 0 % (ref 0.0–0.2)

## 2020-10-15 LAB — RENAL FUNCTION PANEL
Albumin: 2.4 g/dL — ABNORMAL LOW (ref 3.5–5.0)
Anion gap: 11 (ref 5–15)
BUN: 107 mg/dL — ABNORMAL HIGH (ref 8–23)
CO2: 28 mmol/L (ref 22–32)
Calcium: 8.5 mg/dL — ABNORMAL LOW (ref 8.9–10.3)
Chloride: 100 mmol/L (ref 98–111)
Creatinine, Ser: 3.24 mg/dL — ABNORMAL HIGH (ref 0.61–1.24)
GFR, Estimated: 19 mL/min — ABNORMAL LOW (ref >60)
Glucose, Bld: 163 mg/dL — ABNORMAL HIGH (ref 70–99)
Phosphorus: 4.6 mg/dL (ref 2.5–4.6)
Potassium: 4 mmol/L (ref 3.5–5.1)
Sodium: 139 mmol/L (ref 135–145)

## 2020-10-15 NOTE — Progress Notes (Signed)
PROGRESS NOTE    Darryl Diaz  YIR:[id number] DOB: [birth year] DOA: 10/13/2020 PCP: [name], MD    Brief Narrative:  80 year old male with a history of chronic kidney disease stage IV, paroxysmal atrial fibrillation on anticoagulation, hypertension, diastolic heart failure, admitted to the hospital with left foot wound.  He is currently on IV antibiotics.  Currently, MRI pending to evaluate for underlying osteomyelitis.  Podiatry following.   Assessment & Plan:   Principal Problem:   Cellulitis of great toe of left foot Active Problems:   PAF (paroxysmal atrial fibrillation) (HCC)   Anemia, chronic disease   Chronic diastolic congestive heart failure (HCC)   Benign essential HTN   Chronic anticoagulation   Skin ulcer of left great toe (HCC)   CKD (chronic kidney disease) stage 4, GFR 15-29 ml/min (HCC)   Chronic heart failure with preserved ejection fraction (HFpEF) (HCC)   Cellulitis of left foot -No obvious bony process on plain films -MRI foot shows possible osteomyelitis in the distal phalanx of the great toe -Currently on intravenous antibiotics -Podiatry following for possible operative management, await further input -ABIs have been ordered  Paroxysmal atrial fibrillation -He is chronically on Eliquis, will hold for now in case operative management is needed -Heart rate is currently stable -Continue on amiodarone  Hypertension -He was on Norvasc, but this was stopped recently due to soft blood pressures and concerned that it was contributing to his lower extremity edema. 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 Joelyn Oms recommended changing prednisone from 60 mg to 40 mg daily for the next 2 weeks and then 20 mg daily thereafter -Creatinine currently appears to be near baseline -BUN elevated in 120s, likely related to steroids,  trending down now -no symptoms of uremia at present -no reported GI bleeding -continue to follow  Anemia of chronic disease -Hemoglobin was low at 7.2 -discussed with Dr. Joelyn Oms and he would support keeping Hgb above 8, considering his multiple co morbidities and possible need for surgery -He was transfused 1 unit prbc and follow up hgb was 7.4 -it was noted that cbc was drawn around the time that blood transfusion was still running or recently completed -will recheck CBC since several hours have passed since transfusion and hemoglobin may be more accurate -would consider further transfusion if hgb is trending down, otherwise, patient is feeling better, so it would be reasonable to observe  Chronic diastolic congestive heart failure -Reports losing 20 pounds in the past 2 weeks since diuretics have been changed to torsemide -Will continue torsemide 20 mg twice daily since he still has evidence of persistent edema      DVT prophylaxis: heparin injection 5,000 Units Start: 10/14/20 1400  Code Status: full code Family Communication: Patient has requested his sister [name] be primary contact. discussed with sister [name] over the phone Disposition Plan: Status is: Inpatient  Remains inpatient appropriate because:Ongoing diagnostic testing needed not appropriate for outpatient work up, IV treatments appropriate due to intensity of illness or inability to take PO and Inpatient level of care appropriate due to severity of illness   Dispo: The patient is from: Home              Anticipated d/c is to: Home              Patient currently is not medically stable to d/c.   Difficult to place patient No  Consultants:  Podiatry  Procedures:    Antimicrobials:  Cefepime 6/7>  Vancomycin 6/7 >   Subjective: Feeling better today, no shortness of breath, able to ambulate to bathroom, denies any bloody stools  Objective: Vitals:   10/15/20 0109 10/15/20 0335 10/15/20 0506  10/15/20 1213  BP: (!) 152/78 (!) 147/67 (!) 145/63 (!) 146/67  Pulse: 80 77 76 74  Resp: 18 16 17 18   Temp: 98.2 F (36.8 C) 98.2 F (36.8 C) 98.6 F (37 C) 97.8 F (36.6 C)  TempSrc:  Oral Oral Oral  SpO2:  97% 98% 99%    Intake/Output Summary (Last 24 hours) at 10/15/2020 1459 Last data filed at 10/15/2020 0906 Gross per 24 hour  Intake 315 ml  Output 1175 ml  Net -860 ml   There were no vitals filed for this visit.  Examination:  General exam: Alert, awake, oriented x 3 Respiratory system: Clear to auscultation. Respiratory effort normal. Cardiovascular system:RRR. No murmurs, rubs, gallops. Gastrointestinal system: Abdomen is nondistended, soft and nontender. No organomegaly or masses felt. Normal bowel sounds heard. Central nervous system: Alert and oriented. No focal neurological deficits. Extremities: 1+ edema bilaterally Skin: wound on dorsal aspect of left foot, large ulcer on plantar aspect of left great toe Psychiatry: Judgement and insight appear normal. Mood & affect appropriate.      Data Reviewed: I have personally reviewed following labs and imaging studies  CBC: Recent Labs  Lab 10/13/20 1218 10/14/20 0304 10/15/20 0340  WBC 16.3* 14.9* 10.7*  NEUTROABS 14.6*  --   --   HGB 7.7* 7.2* 7.4*  HCT 25.2* 23.0* 22.9*  MCV 103.7* 101.8* 98.7  PLT 225 191 001   Basic Metabolic Panel: Recent Labs  Lab 10/13/20 1218 10/14/20 0304 10/15/20 0340  NA 134* 137 139  K 3.5 3.6 4.0  CL 94* 96* 100  CO2 28 28 28   GLUCOSE 115* 213* 163*  BUN 126* 120* 107*  CREATININE 3.37* 3.24* 3.24*  CALCIUM 9.2 8.9 8.5*  PHOS  --   --  4.6   GFR: Estimated Creatinine Clearance: 24 mL/min (A) (by C-G formula based on SCr of 3.24 mg/dL (H)). Liver Function Tests: Recent Labs  Lab 10/13/20 1218 10/15/20 0340  AST 21  --   ALT 29  --   ALKPHOS 64  --   BILITOT 0.6  --   PROT 5.8*  --   ALBUMIN 3.2* 2.4*   No results for input(s): LIPASE, AMYLASE in the last  168 hours. No results for input(s): AMMONIA in the last 168 hours. Coagulation Profile: No results for input(s): INR, PROTIME in the last 168 hours. Cardiac Enzymes: No results for input(s): CKTOTAL, CKMB, CKMBINDEX, TROPONINI in the last 168 hours. BNP (last 3 results) No results for input(s): PROBNP in the last 8760 hours. HbA1C: No results for input(s): HGBA1C in the last 72 hours. CBG: Recent Labs  Lab 10/14/20 1626  GLUCAP 166*   Lipid Profile: No results for input(s): CHOL, HDL, LDLCALC, TRIG, CHOLHDL, LDLDIRECT in the last 72 hours. Thyroid Function Tests: No results for input(s): TSH, T4TOTAL, FREET4, T3FREE, THYROIDAB in the last 72 hours. Anemia Panel: No results for input(s): VITAMINB12, FOLATE, FERRITIN, TIBC, IRON, RETICCTPCT in the last 72 hours. Sepsis Labs: No results for input(s): PROCALCITON, LATICACIDVEN in the last 168 hours.  Recent Results (from the past 240 hour(s))  Resp Panel by RT-PCR (Flu A&B, Covid) Nasopharyngeal Swab     Status: None   Collection Time: 10/13/20 12:01 PM  Specimen: Nasopharyngeal Swab; Nasopharyngeal(NP) swabs in vial transport medium  Result Value Ref Range Status   SARS Coronavirus 2 by RT PCR NEGATIVE NEGATIVE Final    Comment: (NOTE) SARS-CoV-2 target nucleic acids are NOT DETECTED.  The SARS-CoV-2 RNA is generally detectable in upper respiratory specimens during the acute phase of infection. The lowest concentration of SARS-CoV-2 viral copies this assay can detect is 138 copies/mL. A negative result does not preclude SARS-Cov-2 infection and should not be used as the sole basis for treatment or other patient management decisions. A negative result may occur with  improper specimen collection/handling, submission of specimen other than nasopharyngeal swab, presence of viral mutation(s) within the areas targeted by this assay, and inadequate number of viral copies(<138 copies/mL). A negative result must be combined  with clinical observations, patient history, and epidemiological information. The expected result is Negative.  Fact Sheet for Patients:  EntrepreneurPulse.com.au  Fact Sheet for Healthcare Providers:  IncredibleEmployment.be  This test is no t yet approved or cleared by the [place] FDA and  has been authorized for detection and/or diagnosis of SARS-CoV-2 by FDA under an Emergency Use Authorization (EUA). This EUA will remain  in effect (meaning this test can be used) for the duration of the COVID-19 declaration under Section 564(b)(1) of the Act, 21 U.S.C.section 360bbb-3(b)(1), unless the authorization is terminated  or revoked sooner.       Influenza A by PCR NEGATIVE NEGATIVE Final   Influenza B by PCR NEGATIVE NEGATIVE Final    Comment: (NOTE) The Xpert Xpress SARS-CoV-2/FLU/RSV plus assay is intended as an aid in the diagnosis of influenza from Nasopharyngeal swab specimens and should not be used as a sole basis for treatment. Nasal washings and aspirates are unacceptable for Xpert Xpress SARS-CoV-2/FLU/RSV testing.  Fact Sheet for Patients: EntrepreneurPulse.com.au  Fact Sheet for Healthcare Providers: IncredibleEmployment.be  This test is not yet approved or cleared by the [place] FDA and has been authorized for detection and/or diagnosis of SARS-CoV-2 by FDA under an Emergency Use Authorization (EUA). This EUA will remain in effect (meaning this test can be used) for the duration of the COVID-19 declaration under Section 564(b)(1) of the Act, 21 U.S.C. section 360bbb-3(b)(1), unless the authorization is terminated or revoked.  Performed at [facility], [place] [address] [place], [place] [address]          Radiology Studies: MR FOOT LEFT WO CONTRAST  Result Date: 10/14/2020 CLINICAL DATA:  Foot swelling, nondiabetic, osteomyelitis suspected EXAM: MRI OF THE LEFT FOOT  WITHOUT CONTRAST TECHNIQUE: Multiplanar, multisequence MR imaging of the left was performed. No intravenous contrast was administered. COMPARISON:  Left foot radiograph 10/13/2020 FINDINGS: Bones/Joint/Cartilage There is no evidence of acute fracture. There is mildly increased STIR signal in the distal phalanx of the great toe. Ligaments Intact Lisfranc ligament.  No evidence of plantar plate tear Muscles and Tendons Diffuse muscle atrophy of the foot. Soft tissues Extensive, predominantly dorsal soft tissue swelling of the foot. There is a medial plantar soft tissue defect/ulceration along the distal aspect of the great toe. There is a small focus of susceptibility artifact in this region as well. IMPRESSION: Mildly increased edema signal within the distal phalanx of the great toe, which could represent reactive marrow change or early osteomyelitis. Adjacent soft tissue ulceration, swelling and focus of susceptibility artifact which may represent soft tissue gas or foreign body. Extensive, predominantly dorsal soft tissue swelling of the foot. Diffuse muscle atrophy in the foot. Electronically Signed  By: Maurine Simmering   On: 10/14/2020 15:27   DG Foot Complete Left  Result Date: 10/13/2020 CLINICAL DATA:  Infection. Wound infection. Patient reports stitches on great toe. EXAM: LEFT FOOT - COMPLETE 3+ VIEW COMPARISON:  None. FINDINGS: Surgical hardware in the distal fibula and tibia, partially included. No acute fracture. Mild hammertoe deformity of the digits. 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 There is no erosion, bony destruction, periosteal reaction, or abnormal bone density to suggest osteomyelitis. Skin and soft tissue irregularity noted about the medial and plantar aspect of the great toe. Dorsal soft tissue edema with occasional skin irregularity. No radiopaque foreign body or tracking soft tissue air. IMPRESSION: 1. Skin and soft tissue irregularity about the medial and plantar  aspect of the great toe. No radiographic findings of osteomyelitis. 2. Dorsal soft tissue edema. Electronically Signed   By: Keith Rake M.D.   On: 10/13/2020 19:39   VAS [place] ABI WITH/WO TBI  Result Date: 10/15/2020  LOWER EXTREMITY DOPPLER STUDY Patient Name:  Darryl Diaz  Date of Exam:   10/15/2020 Medical Rec #: [id number]         Accession #:    [id number] Date of Birth: 04-05-1941         Patient Gender: M Patient Age:   80Y Exam Location:  [facility] Procedure:      VAS [place] ABI WITH/WO TBI Referring Phys: [id number] Coqui --------------------------------------------------------------------------------  Indications: Ulceration. High Risk Factors: Hypertension.  Limitations: Today's exam was limited due to an open wound and bandages. Comparison Study: No prior studies. Performing Technologist: Carlos Levering RVT  Examination Guidelines: A complete evaluation includes at minimum, Doppler waveform signals and systolic blood pressure reading at the level of bilateral brachial, anterior tibial, and posterior tibial arteries, when vessel segments are accessible. Bilateral testing is considered an integral part of a complete examination. Photoelectric Plethysmograph (PPG) waveforms and toe systolic pressure readings are included as required and additional duplex testing as needed. Limited examinations for reoccurring indications may be performed as noted.  ABI Findings: +---------+------------------+-----+---------+--------+ Right    Rt Pressure (mmHg)IndexWaveform Comment  +---------+------------------+-----+---------+--------+ Brachial 151                    triphasic         +---------+------------------+-----+---------+--------+ PTA      219               1.45 triphasic         +---------+------------------+-----+---------+--------+ DP       223               1.48 triphasic         +---------+------------------+-----+---------+--------+ Great Toe157                1.04                   +---------+------------------+-----+---------+--------+ +--------+------------------+-----+---------+-------+ Left    Lt Pressure (mmHg)IndexWaveform Comment +--------+------------------+-----+---------+-------+ Brachial150                    triphasic        +--------+------------------+-----+---------+-------+ PTA     214               1.42 triphasic        +--------+------------------+-----+---------+-------+ DP      216               1.43 triphasic        +--------+------------------+-----+---------+-------+ +-------+-----------+-----------+------------+------------+  ABI/TBIToday's ABIToday's TBIPrevious ABIPrevious TBI +-------+-----------+-----------+------------+------------+ Right  1.48       1.04                                +-------+-----------+-----------+------------+------------+ Left   1.43                                           +-------+-----------+-----------+------------+------------+  Summary: Right: Resting right ankle-brachial index indicates noncompressible right lower extremity arteries. The right toe-brachial index is normal. Left: Resting left ankle-brachial index indicates noncompressible left lower extremity arteries. Unable to obtain TBI due to open wounds.  *See table(s) above for measurements and observations.     Preliminary          Scheduled Meds:  sodium chloride   Intravenous Once   amiodarone  200 mg Oral Daily   atorvastatin  20 mg Oral Daily   heparin injection (subcutaneous)  5,000 Units Subcutaneous Q8H   multivitamin with minerals  1 tablet Oral Daily   potassium chloride SA  20 mEq Oral BID   predniSONE  40 mg Oral Q breakfast   sodium chloride flush  3 mL Intravenous Q12H   tamsulosin  0.4 mg Oral QPM   torsemide  20 mg Oral BID   traZODone  50 mg Oral QHS   Continuous Infusions:  sodium chloride     ceFEPime (MAXIPIME) IV 2 g (10/15/20 0038)     LOS: 2 days    Time spent:  68mins    Kathie Dike, MD [facility]   If 7PM-7AM, please contact night-coverage [email or web address]  10/15/2020, 2:59 PM

## 2020-10-15 NOTE — Progress Notes (Signed)
ABI's have been completed. Preliminary results can be found in CV Proc through chart review.   10/15/20 11:17 AM Carlos Levering RVT

## 2020-10-16 DIAGNOSIS — L03032 Cellulitis of left toe: Secondary | ICD-10-CM

## 2020-10-16 DIAGNOSIS — L03116 Cellulitis of left lower limb: Secondary | ICD-10-CM

## 2020-10-16 DIAGNOSIS — I480 Paroxysmal atrial fibrillation: Secondary | ICD-10-CM

## 2020-10-16 LAB — CBC
HCT: 24 % — ABNORMAL LOW (ref 39.0–52.0)
Hemoglobin: 7.8 g/dL — ABNORMAL LOW (ref 13.0–17.0)
MCH: 32.2 pg (ref 26.0–34.0)
MCHC: 32.5 g/dL (ref 30.0–36.0)
MCV: 99.2 fL (ref 80.0–100.0)
Platelets: 173 10*3/uL (ref 150–400)
RBC: 2.42 MIL/uL — ABNORMAL LOW (ref 4.22–5.81)
RDW: 14.1 % (ref 11.5–15.5)
WBC: 9.4 10*3/uL (ref 4.0–10.5)
nRBC: 0 % (ref 0.0–0.2)

## 2020-10-16 LAB — RENAL FUNCTION PANEL
Albumin: 2.3 g/dL — ABNORMAL LOW (ref 3.5–5.0)
Anion gap: 12 (ref 5–15)
BUN: 102 mg/dL — ABNORMAL HIGH (ref 8–23)
CO2: 26 mmol/L (ref 22–32)
Calcium: 8.5 mg/dL — ABNORMAL LOW (ref 8.9–10.3)
Chloride: 100 mmol/L (ref 98–111)
Creatinine, Ser: 2.93 mg/dL — ABNORMAL HIGH (ref 0.61–1.24)
GFR, Estimated: 21 mL/min — ABNORMAL LOW (ref >60)
Glucose, Bld: 107 mg/dL — ABNORMAL HIGH (ref 70–99)
Phosphorus: 4.5 mg/dL (ref 2.5–4.6)
Potassium: 4.1 mmol/L (ref 3.5–5.1)
Sodium: 138 mmol/L (ref 135–145)

## 2020-10-16 LAB — BPAM RBC
Blood Product Expiration Date: 202207092359
ISSUE DATE / TIME: 202206090045
Unit Type and Rh: 5100

## 2020-10-16 LAB — TYPE AND SCREEN
ABO/RH(D): O POS
Antibody Screen: NEGATIVE
Unit division: 0

## 2020-10-16 MED ORDER — LORATADINE 10 MG PO TABS
10.0000 mg | ORAL_TABLET | Freq: Every day | ORAL | Status: DC
  Administered 2020-10-16 – 2020-10-17 (×2): 10 mg via ORAL
  Filled 2020-10-16 (×3): qty 1

## 2020-10-16 MED ORDER — APIXABAN 5 MG PO TABS
5.0000 mg | ORAL_TABLET | Freq: Two times a day (BID) | ORAL | Status: DC
  Administered 2020-10-16 – 2020-10-17 (×2): 5 mg via ORAL
  Filled 2020-10-16 (×2): qty 1

## 2020-10-16 MED ORDER — ESCITALOPRAM OXALATE 10 MG PO TABS
10.0000 mg | ORAL_TABLET | Freq: Every day | ORAL | Status: DC
  Administered 2020-10-16 – 2020-10-17 (×2): 10 mg via ORAL
  Filled 2020-10-16 (×2): qty 1

## 2020-10-16 MED ORDER — PANTOPRAZOLE SODIUM 40 MG PO TBEC
40.0000 mg | DELAYED_RELEASE_TABLET | Freq: Every day | ORAL | Status: DC
  Administered 2020-10-16 – 2020-10-17 (×2): 40 mg via ORAL
  Filled 2020-10-16 (×2): qty 1

## 2020-10-16 MED ORDER — MAGNESIUM OXIDE 400 MG PO CAPS: 400.0000 mg | ORAL_CAPSULE | Freq: Every day | ORAL | Status: DC

## 2020-10-16 MED ORDER — MAGNESIUM OXIDE -MG SUPPLEMENT 400 (240 MG) MG PO TABS
400.0000 mg | ORAL_TABLET | Freq: Every day | ORAL | Status: DC
  Administered 2020-10-16 – 2020-10-17 (×2): 400 mg via ORAL
  Filled 2020-10-16 (×2): qty 1

## 2020-10-16 NOTE — Care Management Important Message (Signed)
Important Message  Patient Details  Name: Darryl Diaz MRN: [id number] Date of Birth: [birth year]   Medicare Important Message Given:  Yes - Important Message mailed due to current National Emergency   Verbal consent obtained due to current National Emergency  Relationship to patient: Self Contact Name: Renaldo Call Date: 10/16/20  Time: 0958 Phone: [phone] Outcome: No Answer/Busy Important Message mailed to: Patient address on file    Delorse Lek 10/16/2020, 9:58 AM

## 2020-10-16 NOTE — Consult Note (Signed)
  Subjective:  Patient ID: Darryl Diaz, male    DOB: 1940-09-11,  MRN: [id number]  A 80 y.o. male with concern for left dorsal midfoot wound as well as left hallux wound.  Patient states he is doing well.  He denies any acute complaints.  He would like to discuss the MRI and see if he needs surgery.  He also had ABIs PVRs done. Objective:   Vitals:   10/16/20 0040 10/16/20 0503  BP: (!) 153/69 (!) 154/73  Pulse: 72 70  Resp: 18 18  Temp: 97.7 F (36.5 C) 97.6 F (36.4 C)  SpO2: 97% 97%   General AA&O x3. Normal mood and affect.  Vascular Dorsalis pedis and posterior tibial pulses nonpalpable Diminished capillary refill to all digits. Pedal hair not present.  Neurologic Epicritic sensation grossly intact.  Dermatologic  left dorsal midfoot wound does not probe down to bone.  Probes down to deep tissue.  No exposure of tendon.  Fiber granular wound base.  No malodor present no purulent drainage expressed.  Left hallux wound probing down to deep tissue not down to bone.  No purulent drainage noted.  Redness noted up to the midfoot.  Orthopedic: MMT 5/5 in dorsiflexion, plantarflexion, inversion, and eversion. Normal joint ROM without pain or crepitus.    1. Skin and soft tissue irregularity about the medial and plantar aspect of the great toe. No radiographic findings of osteomyelitis. 2. Dorsal soft tissue edema. Assessment & Plan:  Patient was evaluated and treated and all questions answered.  Left dorsal midfoot wound and left hallux wound probing down to deep tissue -All questions and concerns were addressed at bedside -MRI showed that there may be a possibility of osteomyelitis however it does not correlate clinically.  Therefore at this time I believe patient will benefit from nonsurgical intervention with local wound care. -His ABIs PVR does show that he is got vascular compromise to the left foot which could likely be preventing him from healing both of the wounds.   Patient will benefit from vascular surgery consult. -I will hold off on surgical intervention.  I can manage this in an outpatient setting. -Patient can be discharged on p.o. antibiotics doxycycline for next 14 days -Local wound care with Betadine wet-to-dry -Partial weightbearing to the heel with Darco wedge shoe  Felipa Furnace, DPM  Accessible via secure chat for questions or concerns.

## 2020-10-16 NOTE — Progress Notes (Signed)
Orthopedic Tech Progress Note Patient Details:  Darryl Diaz 1940-06-01 [id number]  Ortho Devices Type of Ortho Device: Darco shoe Ortho Device/Splint Interventions: Ordered      [name] 10/16/2020, 6:06 PM

## 2020-10-16 NOTE — Progress Notes (Signed)
PROGRESS NOTE    Darryl Diaz  FYB:[id number] DOB: 1941-01-17 DOA: 10/13/2020 PCP: [name], MD   Chief Complaint  Patient presents with   Wound Infection    Brief Narrative:  80 year old male with a history of chronic kidney disease stage IV, paroxysmal atrial fibrillation on anticoagulation, hypertension, diastolic heart failure, admitted to the hospital with left foot wound.  He is currently on IV antibiotics.  MRI foot with increased edema, concern for possible early osteomyelitis.  Vascular surgery consultation pending as ABI concerning for PVD.  Podiatry following.     Assessment & Plan:   Principal Problem:   Cellulitis of great toe of left foot Active Problems:   PAF (paroxysmal atrial fibrillation) (HCC)   Anemia, chronic disease   Chronic diastolic congestive heart failure (HCC)   Benign essential HTN   Chronic anticoagulation   Skin ulcer of left great toe (HCC)   CKD (chronic kidney disease) stage 4, GFR 15-29 ml/min (HCC)   Chronic heart failure with preserved ejection fraction (HFpEF) ([place])   1 left foot cellulitis -Patient presented with cellulitis of the left foot, plain films done with no obvious osteomyelitis noted. -MRI foot with concerns for early osteomyelitis in the distal phalanx of the great toe. -ABIs done with concerns for vascular compromise to the left foot. -Podiatry consulted and recommending vascular input and recommendations. -Podiatry holding off on surgical activation at this time and recommending treatment with antibiotics, wound care, outpatient follow-up, partial weightbearing to heel with Darco wedge shoe. -Continue IV cefepime. -We will transition to doxycycline and Augmentin on discharge for 14 more days. -Vascular consultation pending. -Podiatry following and appreciate their input and recommendations.  2.  Paroxysmal atrial fibrillation -Continue amiodarone. -Resume Eliquis which was on hold as no surgical intervention  is planned at this time.  3.  Hypertension -Patient noted to have recently been on Norvasc that was discontinued due to soft blood pressures and concern for lower extremity edema. -BP stable. -Follow.  4.  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 Joelyn Oms. -Patient noted to be on high-dose prednisone. -Discussed with Dr. Joelyn Oms who are recommending changing prednisone from 60 to 40 mg daily for the next 2 weeks then 20 mg daily thereafter. -Renal function slightly improved in the past 24 hours. -Outpatient follow-up with nephrology.  5.  Anemia of chronic disease -Hemoglobin noted to be low at 7.2. -Status post transfusion 1 unit packed red blood cells. -Hemoglobin currently 7.8. -Dr. Roderic Palau discussed with Dr. Joelyn Oms who recommended transfusion threshold hemoglobin > 8, due to multiple comorbidities and possible need for surgery. -Repeat H&H. -Transfusion threshold hemoglobin < 8.  6.  Chronic diastolic CHF -Patient noted to have lost 20 pounds in the past 2 weeks since diuretics were changed to torsemide. -Continue torsemide 20 mg twice daily as patient noted to still have some evidence of persistent edema. -Outpatient follow-up.  DVT prophylaxis: Eliquis Code Status: Full Family Communication: Updated patient.  No family at bedside Disposition:   Status is: Inpatient  Remains inpatient appropriate because:IV treatments appropriate due to intensity of illness or inability to take PO  Dispo: The patient is from: Home              Anticipated d/c is to: Home              Patient currently is not medically stable to d/c.   Difficult to place patient No       Consultants:  Vascular surgery pending Podiatry: Dr. Posey Pronto 10/16/2020  Procedures:  ABI 10/15/2020 Plain films left foot 10/15/2020 MRI left foot 10/14/2020   Antimicrobials:  IV cefepime 10/14/2020>>>> IV vancomycin 6/7/2022x1 dose. IV Rocephin  6/7/2022x1 dose   Subjective: Patient sitting up on the side of the bed.  Stated somebody just check pulses on his foot and they state he has pulses.  No chest pain.  No shortness of breath.  Denies any significant pain to the left foot as he states he has no feeling to the right foot anyway.  Objective: Vitals:   10/15/20 1213 10/15/20 1731 10/16/20 0040 10/16/20 0503  BP: (!) 146/67 (!) 158/58 (!) 153/69 (!) 154/73  Pulse: 74 83 72 70  Resp: 18 18 18 18   Temp: 97.8 F (36.6 C) 97.8 F (36.6 C) 97.7 F (36.5 C) 97.6 F (36.4 C)  TempSrc: Oral Oral Oral Oral  SpO2: 99% 97% 97% 97%    Intake/Output Summary (Last 24 hours) at 10/16/2020 1205 Last data filed at 10/16/2020 7062 Gross per 24 hour  Intake 1550 ml  Output 1150 ml  Net 400 ml   There were no vitals filed for this visit.  Examination:  General exam: Appears calm and comfortable  Respiratory system: Clear to auscultation.  No wheezes, no crackles, no rhonchi.  Respiratory effort normal. Cardiovascular system: S1 & S2 heard, RRR. No JVD, murmurs, rubs, gallops or clicks. No pedal edema. Gastrointestinal system: Abdomen is nondistended, soft and nontender. No organomegaly or masses felt. Normal bowel sounds heard. Central nervous system: Alert and oriented. No focal neurological deficits. Extremities: Left dorsal midfoot wound, left hallux wound with no purulent discharge.  Erythema of left midfoot. Skin: No rashes, lesions or ulcers Psychiatry: Judgement and insight appear normal. Mood & affect appropriate.     Data Reviewed: I have personally reviewed following labs and imaging studies  CBC: Recent Labs  Lab 10/13/20 1218 10/14/20 0304 10/15/20 0340 10/15/20 1525 10/16/20 0306  WBC 16.3* 14.9* 10.7* 11.0* 9.4  NEUTROABS 14.6*  --   --   --   --   HGB 7.7* 7.2* 7.4* 8.2* 7.8*  HCT 25.2* 23.0* 22.9* 25.9* 24.0*  MCV 103.7* 101.8* 98.7 100.8* 99.2  PLT 225 191 172 205 376    Basic Metabolic  Panel: Recent Labs  Lab 10/13/20 1218 10/14/20 0304 10/15/20 0340 10/16/20 0306  NA 134* 137 139 138  K 3.5 3.6 4.0 4.1  CL 94* 96* 100 100  CO2 28 28 28 26   GLUCOSE 115* 213* 163* 107*  BUN 126* 120* 107* 102*  CREATININE 3.37* 3.24* 3.24* 2.93*  CALCIUM 9.2 8.9 8.5* 8.5*  PHOS  --   --  4.6 4.5    GFR: Estimated Creatinine Clearance: 26.6 mL/min (A) (by C-G formula based on SCr of 2.93 mg/dL (H)).  Liver Function Tests: Recent Labs  Lab 10/13/20 1218 10/15/20 0340 10/16/20 0306  AST 21  --   --   ALT 29  --   --   ALKPHOS 64  --   --   BILITOT 0.6  --   --   PROT 5.8*  --   --   ALBUMIN 3.2* 2.4* 2.3*    CBG: Recent Labs  Lab 10/14/20 1626  GLUCAP 166*     Recent Results (from the past 240 hour(s))  Resp Panel by RT-PCR (Flu A&B, Covid) Nasopharyngeal Swab     Status: None   Collection Time: 10/13/20 12:01 PM   Specimen: Nasopharyngeal Swab; Nasopharyngeal(NP)  swabs in vial transport medium  Result Value Ref Range Status   SARS Coronavirus 2 by RT PCR NEGATIVE NEGATIVE Final    Comment: (NOTE) SARS-CoV-2 target nucleic acids are NOT DETECTED.  The SARS-CoV-2 RNA is generally detectable in upper respiratory specimens during the acute phase of infection. The lowest concentration of SARS-CoV-2 viral copies this assay can detect is 138 copies/mL. A negative result does not preclude SARS-Cov-2 infection and should not be used as the sole basis for treatment or other patient management decisions. A negative result may occur with  improper specimen collection/handling, submission of specimen other than nasopharyngeal swab, presence of viral mutation(s) within the areas targeted by this assay, and inadequate number of viral copies(<138 copies/mL). A negative result must be combined with clinical observations, patient history, and epidemiological information. The expected result is Negative.  Fact Sheet for Patients:   EntrepreneurPulse.com.au  Fact Sheet for Healthcare Providers:  IncredibleEmployment.be  This test is no t yet approved or cleared by the [place] FDA and  has been authorized for detection and/or diagnosis of SARS-CoV-2 by FDA under an Emergency Use Authorization (EUA). This EUA will remain  in effect (meaning this test can be used) for the duration of the COVID-19 declaration under Section 564(b)(1) of the Act, 21 U.S.C.section 360bbb-3(b)(1), unless the authorization is terminated  or revoked sooner.       Influenza A by PCR NEGATIVE NEGATIVE Final   Influenza B by PCR NEGATIVE NEGATIVE Final    Comment: (NOTE) The Xpert Xpress SARS-CoV-2/FLU/RSV plus assay is intended as an aid in the diagnosis of influenza from Nasopharyngeal swab specimens and should not be used as a sole basis for treatment. Nasal washings and aspirates are unacceptable for Xpert Xpress SARS-CoV-2/FLU/RSV testing.  Fact Sheet for Patients: EntrepreneurPulse.com.au  Fact Sheet for Healthcare Providers: IncredibleEmployment.be  This test is not yet approved or cleared by the [place] FDA and has been authorized for detection and/or diagnosis of SARS-CoV-2 by FDA under an Emergency Use Authorization (EUA). This EUA will remain in effect (meaning this test can be used) for the duration of the COVID-19 declaration under Section 564(b)(1) of the Act, 21 U.S.C. section 360bbb-3(b)(1), unless the authorization is terminated or revoked.  Performed at [facility], [place] [address], [place], [place] [address]          Radiology Studies: MR FOOT LEFT WO CONTRAST  Result Date: 10/14/2020 CLINICAL DATA:  Foot swelling, nondiabetic, osteomyelitis suspected EXAM: MRI OF THE LEFT FOOT WITHOUT CONTRAST TECHNIQUE: Multiplanar, multisequence MR imaging of the left was performed. No intravenous contrast was administered.  COMPARISON:  Left foot radiograph 10/13/2020 FINDINGS: Bones/Joint/Cartilage There is no evidence of acute fracture. There is mildly increased STIR signal in the distal phalanx of the great toe. Ligaments Intact Lisfranc ligament.  No evidence of plantar plate tear Muscles and Tendons Diffuse muscle atrophy of the foot. Soft tissues Extensive, predominantly dorsal soft tissue swelling of the foot. There is a medial plantar soft tissue defect/ulceration along the distal aspect of the great toe. There is a small focus of susceptibility artifact in this region as well. IMPRESSION: Mildly increased edema signal within the distal phalanx of the great toe, which could represent reactive marrow change or early osteomyelitis. Adjacent soft tissue ulceration, swelling and focus of susceptibility artifact which may represent soft tissue gas or foreign body. Extensive, predominantly dorsal soft tissue swelling of the foot. Diffuse muscle atrophy in the foot. Electronically Signed   By: Maurine Simmering  On: 10/14/2020 15:27   VAS [place] ABI WITH/WO TBI  Result Date: 10/15/2020  LOWER EXTREMITY DOPPLER STUDY Patient Name:  ZIMRI BRENNEN  Date of Exam:   10/15/2020 Medical Rec #: [id number]         Accession #:    [id number] Date of Birth: [birth year]         Patient Gender: M Patient Age:   80Y Exam Location:  [facility] Procedure:      VAS [place] ABI WITH/WO TBI Referring Phys: [id number] [place] --------------------------------------------------------------------------------  Indications: Ulceration. High Risk Factors: Hypertension.  Limitations: Today's exam was limited due to an open wound and bandages. Comparison Study: No prior studies. Performing Technologist: Carlos Levering RVT  Examination Guidelines: A complete evaluation includes at minimum, Doppler waveform signals and systolic blood pressure reading at the level of bilateral brachial, anterior tibial, and posterior tibial arteries, when vessel segments are  accessible. Bilateral testing is considered an integral part of a complete examination. Photoelectric Plethysmograph (PPG) waveforms and toe systolic pressure readings are included as required and additional duplex testing as needed. Limited examinations for reoccurring indications may be performed as noted.  ABI Findings: +---------+------------------+-----+---------+--------+ Right    Rt Pressure (mmHg)IndexWaveform Comment  +---------+------------------+-----+---------+--------+ Brachial 151                    triphasic         +---------+------------------+-----+---------+--------+ PTA      219               1.45 triphasic         +---------+------------------+-----+---------+--------+ DP       223               1.48 triphasic         +---------+------------------+-----+---------+--------+ Great Toe157               1.04                   +---------+------------------+-----+---------+--------+ +--------+------------------+-----+---------+-------+ Left    Lt Pressure (mmHg)IndexWaveform Comment +--------+------------------+-----+---------+-------+ Brachial150                    triphasic        +--------+------------------+-----+---------+-------+ PTA     214               1.42 triphasic        +--------+------------------+-----+---------+-------+ DP      216               1.43 triphasic        +--------+------------------+-----+---------+-------+ +-------+-----------+-----------+------------+------------+ ABI/TBIToday's ABIToday's TBIPrevious ABIPrevious TBI +-------+-----------+-----------+------------+------------+ Right  1.48       1.04                                +-------+-----------+-----------+------------+------------+ Left   1.43                                           +-------+-----------+-----------+------------+------------+  Summary: Right: Resting right ankle-brachial index indicates noncompressible right lower extremity  arteries. The right toe-brachial index is normal. Left: Resting left ankle-brachial index indicates noncompressible left lower extremity arteries. Unable to obtain TBI due to open wounds.  *See table(s) above for measurements and observations.  Electronically signed by Monica Martinez MD on 10/15/2020 at 7:11:54 PM.  Final         Scheduled Meds:  sodium chloride   Intravenous Once   amiodarone  200 mg Oral Daily   atorvastatin  20 mg Oral Daily   escitalopram  10 mg Oral Daily   heparin injection (subcutaneous)  5,000 Units Subcutaneous Q8H   loratadine  10 mg Oral Daily   Magnesium Oxide  400 mg Oral Daily   multivitamin with minerals  1 tablet Oral Daily   pantoprazole  40 mg Oral Daily   potassium chloride SA  20 mEq Oral BID   predniSONE  40 mg Oral Q breakfast   sodium chloride flush  3 mL Intravenous Q12H   tamsulosin  0.4 mg Oral QPM   torsemide  20 mg Oral BID   traZODone  50 mg Oral QHS   Continuous Infusions:  sodium chloride     ceFEPime (MAXIPIME) IV 2 g (10/15/20 2311)     LOS: 3 days    Time spent: 35 minutes    Irine Seal, MD [facility]   To contact the attending provider between 7A-7P or the covering provider during after hours 7P-7A, please log into the web site [email or web address] and access using universal [place] password for that web site. If you do not have the password, please call the hospital operator.  10/16/2020, 12:05 PM

## 2020-10-16 NOTE — Consult Note (Signed)
Hospital Consult    Reason for Consult:  non healing wounds left foot Requesting Physician:  [facility] MRN #:  [id number]  History of Present Illness: This is a 80 y.o. male PMH significant for atrial fibrillation on Eliquis, HTN, CHF, CKD stage IV, and chronic anemia.  He is being seen inconsultation for evaluation of non healing left foot wounds.  He states the dorsal foot started about 3 weeks ago when he dropped a small bird statue onto his foot.  This was sutured and stitches were removed by his PCP about 10 days ago.  He believes the great toe ulceration also started around the same time.  He is ambulatory with assist with a cane and/or walker.  He lives with his son.  He denies any claudication, rest pain of bilateral lower extremities.  He denies tobacco use.  He denies any fevers, chills, nausea/vomiting.  He has been started on IV antibiotics.  He has been seen by his podiatrist Dr. Posey Pronto who is considering debridement of great toe.  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 following wife's death   Anemia    Ascending aorta dilation (HCC)    Bradycardia    low 20's   Bradycardic cardiac arrest (HCC)    Cardiac arrest ([place]) 04/20/2019   12 min CPR with epi   Chronic diastolic CHF (congestive heart failure) ([place])    Fall at home 03/28/2016   Fatty liver    Gastritis    GERD (gastroesophageal reflux disease)    H/O seasonal allergies    History of GI bleed    Hx of adenomatous colonic polyps 08/10/2017   Hyperlipidemia    Hypertension    Hypotension    Insomnia    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   Mallory-Weiss tear    OA (osteoarthritis)    hands   OSA (obstructive sleep apnea)    No longer uses CPAP   Persistent atrial fibrillation with rapid ventricular response ([place]) 02/05/2020   Recurrent syncope    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)      Past Surgical History:  Procedure Laterality Date   ANKLE SURGERY Left 2014   had rods put in    BIOPSY  02/28/2019   Procedure: BIOPSY;  Surgeon: Milus Banister, MD;  Location: [place];  Service: Endoscopy;;   CARDIOVERSION  02/06/2020   CARDIOVERSION N/A 02/06/2020   Procedure: CARDIOVERSION;  Surgeon: Werner Lean, MD;  Location: [place] ENDOSCOPY;  Service: Cardiovascular;  Laterality: N/A;   CARDIOVERSION N/A 02/24/2020   Procedure: CARDIOVERSION;  Surgeon: Werner Lean, MD;  Location: [place];  Service: Cardiovascular;  Laterality: N/A;   COLONOSCOPY     [place]   ESOPHAGOGASTRODUODENOSCOPY (EGD) WITH PROPOFOL N/A 02/28/2019   Procedure: ESOPHAGOGASTRODUODENOSCOPY (EGD) WITH PROPOFOL;  Surgeon: Milus Banister, MD;  [facility] END[facility]COPY;  Service: Endoscopy;  Laterality: N/A;   HIP ARTHROPLASTY Left 04/01/2016   Procedure: ARTHROPLASTY BIPOLAR HIP (HEMIARTHROPLASTY);  Surgeon: Paralee Cancel, MD;  Location: [place];  Service: Orthopedics;  Laterality: Left;   HIP CLOSED REDUCTION Left 04/30/2016   Procedure: CLOSED REDUCTION HIP;  Surgeon: Wylene Simmer, MD;  Location: [place];  Service: Orthopedics;  Laterality: Left;   TONSILLECTOMY     TOTAL HIP ARTHROPLASTY Right 10/15/2019   Procedure: TOTAL HIP ARTHROPLASTY ANTERIOR APPROACH;  Surgeon: Paralee Cancel, MD;  Location: [facility];  Service: Orthopedics;  Laterality: Right;  70 mins    Allergies  Allergen Reactions   Penicillins Other (See Comments)    Tolerated Cephalosporin Date: 10/15/19. Allergy is from childhood Did it involve swelling of the face/tongue/throat, SOB, or low BP? Unknown Did it involve sudden or severe rash/hives, skin peeling, or any reaction on the inside of your mouth or nose? Unknown Did you need to seek medical attention at a hospital or doctor's office? Unknown When did it last happen?    childhood reaction   If all above answers are "NO", may proceed with cephalosporin use.    Penicillins     Prior to Admission medications   Medication Sig Start Date End Date Taking? Authorizing Provider  acetaminophen (TYLENOL) 325 MG tablet Take 2 tablets (650 mg total) by mouth every 4 (four) hours as needed for headache or mild pain. 03/10/20  Yes Angiulli, Lavon Paganini, PA-C  amiodarone (PACERONE) 200 MG tablet Take 1 tablet (200 mg total) by mouth daily. 04/10/20  Yes Dunn, Dayna N, PA-C  amLODipine (NORVASC) 5 MG tablet Take 1 tablet (5 mg total) by mouth daily. 09/14/20  Yes Gherghe, Vella Redhead, MD  atorvastatin (LIPITOR) 20 MG tablet Take 1 tablet (20 mg total) by mouth daily. 03/10/20  Yes Angiulli, Lavon Paganini, PA-C  dapsone 100 MG tablet Take 100 mg by mouth daily. 09/29/20  Yes [provider]  ELIQUIS 5 MG TABS tablet TAKE 1 TABLET BY MOUTH TWICE DAILY. NEW CUSTOMER FOR [facility]. REQUESTING NEW PRESCRIPTION. Patient taking differently: Take 5 mg by mouth 2 (two) times daily. 08/12/20  Yes Sherren Mocha, MD  escitalopram (LEXAPRO) 10 MG tablet Take 1 tablet (10 mg total) by mouth daily. 03/10/20  Yes Angiulli, Lavon Paganini, PA-C  ferrous sulfate 325 (65 FE) MG tablet Take 1 tablet (325 mg total) by mouth 2 (two) times daily with a meal. Patient taking differently: Take 325 mg by mouth daily. 03/10/20 04/10/20 Yes Angiulli, Lavon Paganini, PA-C  folic acid (FOLVITE) 1 MG tablet Take 1 tablet (1 mg total) by mouth daily. 03/10/20  Yes Angiulli, Lavon Paganini, PA-C  ketoconazole (NIZORAL) 2 % cream Apply 1 application topically daily. To dry scaly skin on feet 07/23/20  Yes McDonald, Stephan Minister, DPM  loratadine (CLARITIN) 10 MG tablet Take 1 tablet (10 mg total) by mouth daily. 02/12/20  Yes Medina-Vargas, Monina C, NP  Magnesium Oxide 400 MG CAPS Take 1 capsule (400 mg total) by mouth 2 (two) times daily. Patient taking differently: Take 400 mg by mouth daily. 05/04/20  Yes Dunn, Dayna N, PA-C  Multiple Vitamins-Minerals (CENTRUM SILVER 50+MEN) TABS Take 1 tablet by mouth daily with breakfast.    Yes [provider]  nitroGLYCERIN (NITROSTAT) 0.4 MG SL tablet Place 1 tablet (0.4 mg total) under the tongue every 5 (five) minutes x 3 doses as needed for chest pain. 07/28/20  Yes Baldwin Jamaica, PA-C  pantoprazole (PROTONIX) 40 MG tablet Take 1 tablet (40 mg total) by mouth daily. 03/10/20  Yes Angiulli, Lavon Paganini, PA-C  potassium chloride SA (KLOR-CON) 20 MEQ tablet Take 2 tablets (40 mEq total) by mouth daily. Patient taking differently: Take 20 mEq by mouth daily. 08/03/20 07/29/21 Yes Sherren Mocha, MD  tamsulosin (FLOMAX) 0.4 MG CAPS capsule Take 1 capsule (0.4 mg total) by mouth every evening. 03/10/20  Yes Angiulli, Lavon Paganini, PA-C  torsemide (DEMADEX) 20 MG tablet Take 60 mg by mouth See admin instructions. TAKE 3 TABLETS BY MOUTH EVERY  MORNING, MAY TAKE A SECOND DOSE OF 3 TABLETS IN THE AFTERNOON AS NEEDED FOR SWELLING   Yes [provider]  traZODone (DESYREL) 50 MG tablet Take 1 tablet (50 mg total) by mouth at bedtime. 03/10/20  Yes Angiulli, Lavon Paganini, PA-C  furosemide (LASIX) 40 MG tablet Take 1 tablet (40 mg total) by mouth daily. Patient not taking: Reported on 10/13/2020 08/03/20 07/29/21  Sherren Mocha, MD  silver sulfADIAZINE (SILVADENE) 1 % cream Apply 1 application topically daily. Patient not taking: No sig reported 07/23/20   Criselda Peaches, DPM    Social History   Socioeconomic History   Marital status: Widowed    Spouse name: Not on file   Number of children: 2   Years of education: college   Highest education level: Not on file  Occupational History   Occupation: retired  Tobacco Use   Smoking status: Never   Smokeless tobacco: Never   Tobacco comments:       quit 20 years  smoked on and off for 20 years  Vaping Use   Vaping Use: Never used  Substance and Sexual Activity   Alcohol use: Not Currently   Drug use: Never   Sexual activity: Not Currently  Other Topics Concern   Not on file  Social History Narrative   ** Merged History  Encounter **       The patient is divorced w/ 1 son 1 daughter He is a retired Regulatory affairs officer, he lived in [place] but [employer] including windows on the world in the [place] room in [place] He moved to [place] to be near his sister who liv   es in [place] alcoholic member of AA 2 caffeinated beverages daily prior smoker no drugs   Social Determinants of Radio broadcast assistant Strain: Not on file  Food Insecurity: Not on file  Transportation Needs: Not on file  Physical Activity: Not on file  Stress: Not on file  Social Connections: Not on file  Intimate Partner Violence: Not on file     Family History  Problem Relation Age of Onset   Heart disease Mother 67   Hypertension Mother    Arthritis Mother    Heart failure Father 71   Stroke Maternal Aunt    Heart failure Sister    Colon cancer Neg Hx    Esophageal cancer Neg Hx    Stomach cancer Neg Hx    Rectal cancer Neg Hx     ROS: Otherwise negative unless mentioned in HPI  Physical Examination  Vitals:   10/16/20 0040 10/16/20 0503  BP: (!) 153/69 (!) 154/73  Pulse: 72 70  Resp: 18 18  Temp: 97.7 F (36.5 C) 97.6 F (36.4 C)  SpO2: 97% 97%   There is no height or weight on file to calculate BMI.  General:  WDWN in NAD Gait: Not observed HENT: WNL, normocephalic Pulmonary: normal non-labored breathing, without Rales, rhonchi,  wheezing Cardiac: regular Abdomen: soft, NT/ND, no masses Skin: without rashes Vascular Exam/Pulses: 2+ L DP and PT pulse Extremities: Photo credit Martinique Robinson PA-C     Dorsal foot ulcer and GT ulcer pictured above Musculoskeletal: no muscle wasting or atrophy  Neurologic: A&O X 3;  No focal weakness or paresthesias are detected; speech is fluent/normal Psychiatric:  The pt has Normal affect. Lymph:  Unremarkable  CBC    Component Value Date/Time   WBC 9.4 10/16/2020 0306   RBC 2.42 (L) 10/16/2020 6503  HGB 7.8 (L)  10/16/2020 0306   HGB 10.2 (L) 06/03/2020 1217   HCT 24.0 (L) 10/16/2020 0306   HCT 31.0 (L) 06/03/2020 1217   PLT 173 10/16/2020 0306   PLT 163 06/03/2020 1217   MCV 99.2 10/16/2020 0306   MCV 93 06/03/2020 1217   MCH 32.2 10/16/2020 0306   MCHC 32.5 10/16/2020 0306   RDW 14.1 10/16/2020 0306   RDW 14.6 06/03/2020 1217   LYMPHSABS 0.6 (L) 10/13/2020 1218   MONOABS 1.0 10/13/2020 1218   EOSABS 0.0 10/13/2020 1218   BASOSABS 0.0 10/13/2020 1218    BMET    Component Value Date/Time   NA 138 10/16/2020 0306   NA 133 (L) 09/03/2020 1612   K 4.1 10/16/2020 0306   CL 100 10/16/2020 0306   CO2 26 10/16/2020 0306   GLUCOSE 107 (H) 10/16/2020 0306   BUN 102 (H) 10/16/2020 0306   BUN 27 09/03/2020 1612   CREATININE 2.93 (H) 10/16/2020 0306   CALCIUM 8.5 (L) 10/16/2020 0306   GFRNONAA 21 (L) 10/16/2020 0306   GFRAA 92 06/16/2020 0858    COAGS: Lab Results  Component Value Date   INR 1.4 (H) 09/07/2020   INR 1.3 (H) 03/16/2020   INR 1.5 (H) 02/24/2020     Non-Invasive Vascular Imaging:    Left    Lt Pressure (mmHg)IndexWaveform Comment  +--------+------------------+-----+---------+-------+  Brachial150                    triphasic         +--------+------------------+-----+---------+-------+  PTA     214               1.42 triphasic         +--------+------------------+-----+---------+-------+  DP      216               1.43 triphasic           ASSESSMENT/PLAN: This is a 80 y.o. male with non healing wounds of L foot   -Based on physical exam, left foot seems to be well perfused with 2+ palpable DP and PT pulses; ABI is falsely elevated however there are triphasic waveforms at the level of the ankle  -Given physical exam findings I do not believe there is any utility in performing any further invasive testing including angiography.  Also given his CKD, there is risk for renal failure and need for hemodialysis with angiography.   - We will defer  wound care to Dr. Posey Pronto.  Also agree with IV antibiotics -On-call vascular surgeon Dr. Stanford Breed will evaluate the patient later today and provide further treatment plans   Dagoberto Ligas PA-C [facility] [phone]

## 2020-10-16 NOTE — Progress Notes (Signed)
ANTICOAGULATION CONSULT NOTE - Initial Consult  Pharmacy Consult for Apixaban Indication: atrial fibrillation  Allergies  Allergen Reactions   Penicillins Other (See Comments)    Tolerated Cephalosporin Date: 10/15/19. Allergy is from childhood Did it involve swelling of the face/tongue/throat, SOB, or low BP? Unknown Did it involve sudden or severe rash/hives, skin peeling, or any reaction on the inside of your mouth or nose? Unknown Did you need to seek medical attention at a hospital or doctor's office? Unknown When did it last happen?    childhood reaction   If all above answers are "NO", may proceed with cephalosporin use.   Penicillins     Patient Measurements:    Vital Signs: Temp: 97.6 F (36.4 C) (06/10 0503) Temp Source: Oral (06/10 0503) BP: 154/73 (06/10 0503) Pulse Rate: 70 (06/10 0503)  Labs: Recent Labs    10/14/20 0304 10/15/20 0340 10/15/20 1525 10/16/20 0306  HGB 7.2* 7.4* 8.2* 7.8*  HCT 23.0* 22.9* 25.9* 24.0*  PLT 191 172 205 173  CREATININE 3.24* 3.24*  --  2.93*    Estimated Creatinine Clearance: 26.6 mL/min (A) (by C-G formula based on SCr of 2.93 mg/dL (H)).   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 following wife's death   Anemia    Ascending aorta dilation (HCC)    Bradycardia    low 20's   Bradycardic cardiac arrest (HCC)    Cardiac arrest ([place]) 04/20/2019   12 min CPR with epi   Chronic diastolic CHF (congestive heart failure) ([place])    Fall at home 03/28/2016   Fatty liver    Gastritis    GERD (gastroesophageal reflux disease)    H/O seasonal allergies    History of GI bleed    Hx of adenomatous colonic polyps 08/10/2017   Hyperlipidemia    Hypertension    Hypotension    Insomnia    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   Mallory-Weiss tear    OA (osteoarthritis)    hands   OSA (obstructive sleep apnea)    No longer uses CPAP   Persistent atrial  fibrillation with rapid ventricular response (HCC) 02/05/2020   Recurrent syncope    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(HCC)     Medications:  Scheduled:   sodium chloride   Intravenous Once   amiodarone  200 mg Oral Daily   atorvastatin  20 mg Oral Daily   escitalopram  10 mg Oral Daily   heparin injection (subcutaneous)  5,000 Units Subcutaneous Q8H   loratadine  10 mg Oral Daily   magnesium oxide  400 mg Oral Daily   multivitamin with minerals  1 tablet Oral Daily   pantoprazole  40 mg Oral Daily   potassium chloride SA  20 mEq Oral BID   predniSONE  40 mg Oral Q breakfast   sodium chloride flush  3 mL Intravenous Q12H   tamsulosin  0.4 mg Oral QPM   torsemide  20 mg Oral BID   traZODone  50 mg Oral QHS   Infusions:   sodium chloride     ceFEPime (MAXIPIME) IV 2 g (10/15/20 2311)    Assessment: 80 years of age male on Apixaban prior to admission for atrial fibrillation who was admitted with non healing left foot wound (MRI with early signs of osteomyelitis). Pharmacy consulted to continuing Apixaban dosing. Last dose of Apixaban was given on 6/8 at 0012 AM.   Podiatry with no plans  for surgical intervention at this time.  Vascular surgery consulted with no procedures at this time.   SCr elevated but trending down at 2.93 (CrCl~26 mL/min)-- likely baseline for this patient based on prior labs.  Age <80 and weight >60 kg - ok for 5 BID dosing.  Hgb 7.8. Platelets are stable. No bleeding reported.   Goal of Therapy:  Monitor platelets by anticoagulation protocol: Yes   Plan:  Apixaban 5 po BID Monitor CBC, renal function, and for any signs of bleeding.  Sloan Leiter, PharmD, BCPS, BCCCP Clinical Pharmacist Please refer to [facility] for Drain numbers 10/16/2020,12:15 PM

## 2020-10-17 DIAGNOSIS — I4891 Unspecified atrial fibrillation: Secondary | ICD-10-CM

## 2020-10-17 LAB — CBC WITH DIFFERENTIAL/PLATELET
Abs Immature Granulocytes: 0.08 10*3/uL — ABNORMAL HIGH (ref 0.00–0.07)
Basophils Absolute: 0 10*3/uL (ref 0.0–0.1)
Basophils Relative: 0 %
Eosinophils Absolute: 0 10*3/uL (ref 0.0–0.5)
Eosinophils Relative: 0 %
HCT: 24.6 % — ABNORMAL LOW (ref 39.0–52.0)
Hemoglobin: 8.1 g/dL — ABNORMAL LOW (ref 13.0–17.0)
Immature Granulocytes: 1 %
Lymphocytes Relative: 8 %
Lymphs Abs: 0.7 10*3/uL (ref 0.7–4.0)
MCH: 32.5 pg (ref 26.0–34.0)
MCHC: 32.9 g/dL (ref 30.0–36.0)
MCV: 98.8 fL (ref 80.0–100.0)
Monocytes Absolute: 0.6 10*3/uL (ref 0.1–1.0)
Monocytes Relative: 7 %
Neutro Abs: 7.4 10*3/uL (ref 1.7–7.7)
Neutrophils Relative %: 84 %
Platelets: 168 10*3/uL (ref 150–400)
RBC: 2.49 MIL/uL — ABNORMAL LOW (ref 4.22–5.81)
RDW: 14.1 % (ref 11.5–15.5)
WBC: 8.9 10*3/uL (ref 4.0–10.5)
nRBC: 0 % (ref 0.0–0.2)

## 2020-10-17 LAB — RENAL FUNCTION PANEL
Albumin: 2.4 g/dL — ABNORMAL LOW (ref 3.5–5.0)
Anion gap: 11 (ref 5–15)
BUN: 98 mg/dL — ABNORMAL HIGH (ref 8–23)
CO2: 27 mmol/L (ref 22–32)
Calcium: 8.7 mg/dL — ABNORMAL LOW (ref 8.9–10.3)
Chloride: 100 mmol/L (ref 98–111)
Creatinine, Ser: 3.05 mg/dL — ABNORMAL HIGH (ref 0.61–1.24)
GFR, Estimated: 20 mL/min — ABNORMAL LOW (ref >60)
Glucose, Bld: 150 mg/dL — ABNORMAL HIGH (ref 70–99)
Phosphorus: 4.8 mg/dL — ABNORMAL HIGH (ref 2.5–4.6)
Potassium: 4 mmol/L (ref 3.5–5.1)
Sodium: 138 mmol/L (ref 135–145)

## 2020-10-17 MED ORDER — PREDNISONE 20 MG PO TABS: 20.0000 mg | ORAL_TABLET | Freq: Every day | ORAL | 1 refills | Status: DC

## 2020-10-17 MED ORDER — ELIQUIS 5 MG PO TABS: 5.0000 mg | ORAL_TABLET | Freq: Two times a day (BID) | ORAL | Status: DC

## 2020-10-17 MED ORDER — DOXYCYCLINE MONOHYDRATE 100 MG PO TABS: 100.0000 mg | ORAL_TABLET | Freq: Two times a day (BID) | ORAL | 0 refills | Status: AC

## 2020-10-17 MED ORDER — MAGNESIUM OXIDE 400 MG PO CAPS: 400.0000 mg | ORAL_CAPSULE | Freq: Every day | ORAL | Status: DC

## 2020-10-17 MED ORDER — POTASSIUM CHLORIDE CRYS ER 20 MEQ PO TBCR: 20.0000 meq | EXTENDED_RELEASE_TABLET | Freq: Every day | ORAL | 3 refills | Status: DC

## 2020-10-17 NOTE — Progress Notes (Signed)
Pt expressing anger, pt wanting to go home. Pt states he is leaving and will sign forms to leave. Explained to pt purpose of AMA form, pt agrees to sign AMA form. Dr Grandville Silos in department and states he will go see pt.

## 2020-10-17 NOTE — Progress Notes (Signed)
Discharge instructiond reviewed and given to pt and pt son, pt/son acknowledge understanding. 2 computer generated scripts by md given to pt. Pt/son observed this rn perform dressing change. Pt explains he was performing dressing w/asst of [employer] pta.  Observed pt son apply ortho shoe to pt left foot w/o difficulty.

## 2020-10-17 NOTE — Discharge Summary (Signed)
Physician Discharge Summary  Darryl Diaz JME:[id number] DOB: Dec 02, 1940 DOA: 10/13/2020  PCP: [name], MD  Admit date: 10/13/2020 Discharge date: 10/17/2020  Time spent: 55 minutes  Recommendations for Outpatient Follow-up:  Follow-up with Tisovec, Darryl Him, MD in 2 weeks.  On follow-up patient will need to basic metabolic profile done to follow-up on electrolytes and renal function. Follow-up with Dr. Posey Diaz, podiatry in 1 to 2 weeks. Patient be discharged home with home health therapies.   Discharge Diagnoses:  Principal Problem:   Cellulitis of great toe of left foot Active Problems:   PAF (paroxysmal atrial fibrillation) (HCC)   Anemia, chronic disease   Chronic diastolic congestive heart failure (HCC)   Benign essential HTN   Chronic anticoagulation   Skin ulcer of left great toe (HCC)   CKD (chronic kidney disease) stage 4, GFR 15-29 ml/min (HCC)   Chronic heart failure with preserved ejection fraction (HFpEF) (HCC)   Cellulitis of left foot   Discharge Condition: Stable and improved.  Diet recommendation: Heart healthy  There were no vitals filed for this visit.  History of present illness:  HPI per DarrylChotiner CHA GOMILLION is a 80 y.o. male with medical history significant for paroxysmal atrial fibrillation on Eliquis, hypertension, HFpEF, CKD stage IV, chronic anemia who presents for evaluation of left foot wound on the bottom of his great toe and dorsal aspect of his left midfoot.  He reports that he dropped a statue on top of the left foot and had sutures placed in the emergency room on Sep 26, 2020.  He was unaware that he had an ulceration of the plantar surface of his great toe on the left foot.  States he has had a callus on the bottom of his toe and he reports he has been walking normally.  He states he usually does use a walker at home to ambulate.  He has not had any pain in his left first toe.  He has had increasing erythema over the last few  days of his left foot and his left foot is warm to touch and painful to touch.  He has had some drainage from the dorsal wound.  He does have chronic pitting edema of his lower legs but states he has lost 20 pounds in the last 2 weeks since his diuretic was changed to torsemide.  States he has not had any fever or chills.  He is otherwise been in his normal state of health with no chest pain, palpitations, shortness of breath, cough or urinary symptoms.   ED Course: [name] is hemodynamically stable.  He was found to have an ulceration on the plantar surface of his left great toe that is deep into the underlying soft tissue.  There is no purulent drainage.  X-ray showed no signs of osteomyelitis.  Patient is unsure of how long he has had the ulceration.  He also has a 1.5 cm laceration to the dorsal aspect of his left midfoot with a serous drainage.  Entire left foot is warm to touch and erythematous.  WBC 16.3, hemoglobin 7.7, hematocrit 25.2, platelets 225,000, 134, potassium 3.5, chloride 94, bicarb 28, glucose 115, BUN 126, creatinine 3.37. Baseline creatinine is 3.25-3.50  Hospital Course:  1 left foot cellulitis -Patient presented with cellulitis of the left foot, plain films done with no obvious osteomyelitis noted. -MRI foot with concerns for early osteomyelitis in the distal phalanx of the great toe. -ABIs done with concerns for vascular compromise to the  left foot. -Podiatry consulted and recommended vascular input and recommendations. -Podiatry holding off on surgical activation at this time and recommending treatment with antibiotics, wound care, outpatient follow-up, partial weightbearing to heel with Darco wedge shoe. -Patient maintained on IV cefepime during the hospitalization.  -Patient seen in consultation by vascular surgery who felt no role for revascularization at this time and cleared patient for debridement/minor amputation as needed and may be seen in clinic if patient has poor  healing. -Patient with discharge home on 14 days of oral doxycycline to complete a course of antibiotic treatment. -Outpatient follow-up with Dr. Posey Diaz podiatry.  2.  Paroxysmal atrial fibrillation -Patient maintained on amiodarone during the hospitalization.  -Eliquis initially held due to possibility of surgical intervention which was not needed and as such Eliquis resumed.   -Outpatient follow-up.  3.  Hypertension -Patient noted to have recently been on Norvasc that was discontinued due to soft blood pressures and concern for lower extremity edema. -BP stable. -Outpatient follow-up.  4.  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 -Patient noted to be on high-dose prednisone. -Discussed with Darryl Diaz who are recommending changing prednisone from 60 to 40 mg daily for the next 2 weeks then 20 mg daily thereafter which patient will be discharged home. -Renal function slightly improved during the hospitalization and creatinine was 3.05 by day of discharge.   -Outpatient follow-up with nephrology.  5.  Anemia of chronic disease -Hemoglobin noted to have stabilized at 8.1 by day of discharge. -Status post transfusion 1 unit packed red blood cells once hemoglobin was 7.2. -Dr. Roderic Diaz discussed with Darryl Diaz who recommended transfusion threshold hemoglobin > 8, due to multiple comorbidities and possible need for surgery.  6.  Chronic diastolic CHF -Patient noted to have lost 20 pounds in the past 2 weeks since diuretics were changed to torsemide. -Patient maintained on torsemide 20 mg twice daily with persistent edema however asymptomatic.   -Outpatient follow-up.     Procedures: ABI 10/15/2020 Plain films left foot 10/15/2020 MRI left foot 10/14/2020    Consultations: Vascular surgery: Darryl Diaz 10/17/2020 Podiatry: Dr. Posey Diaz 10/16/2020  Discharge Exam: Vitals:   10/17/20 0512 10/17/20 1207  BP: (!) 151/67  127/68  Pulse: 63 67  Resp: 18 16  Temp: 97.6 F (36.4 C) (!) 97.5 F (36.4 C)  SpO2: 100% 100%    General: NAD Cardiovascular: RRR no murmurs rubs or gallops.  No JVD.  1-2+ bilateral lower extremity edema Respiratory: CTA B.  Discharge Instructions   Discharge Instructions     Diet - low sodium heart healthy   Complete by: As directed    Increase activity slowly   Complete by: As directed       Allergies as of 10/17/2020       Reactions   Penicillins Other (See Comments)   Tolerated Cephalosporin Date: 10/15/19. Allergy is from childhood Did it involve swelling of the face/tongue/throat, SOB, or low BP? Unknown Did it involve sudden or severe rash/hives, skin peeling, or any reaction on the inside of your mouth or nose? Unknown Did you need to seek medical attention at a hospital or doctor's office? Unknown When did it last happen?    childhood reaction   If all above answers are "NO", may proceed with cephalosporin use.   Penicillins         Medication List     STOP taking these medications    furosemide 40 MG tablet Commonly known  as: LASIX   silver sulfADIAZINE 1 % cream Commonly known as: Silvadene       TAKE these medications    acetaminophen 325 MG tablet Commonly known as: TYLENOL Take 2 tablets (650 mg total) by mouth every 4 (four) hours as needed for headache or mild pain.   amiodarone 200 MG tablet Commonly known as: PACERONE Take 1 tablet (200 mg total) by mouth daily.   amLODipine 5 MG tablet Commonly known as: NORVASC Take 1 tablet (5 mg total) by mouth daily.   atorvastatin 20 MG tablet Commonly known as: LIPITOR Take 1 tablet (20 mg total) by mouth daily.   Centrum Silver 50+Men Tabs Take 1 tablet by mouth daily with breakfast.   dapsone 100 MG tablet Take 100 mg by mouth daily.   doxycycline 100 MG tablet Commonly known as: ADOXA Take 1 tablet (100 mg total) by mouth 2 (two) times daily for 14 days.   Eliquis 5 MG  Tabs tablet Generic drug: apixaban Take 1 tablet (5 mg total) by mouth 2 (two) times daily. What changed: See the new instructions.   escitalopram 10 MG tablet Commonly known as: LEXAPRO Take 1 tablet (10 mg total) by mouth daily.   ferrous sulfate 325 (65 FE) MG tablet Take 1 tablet (325 mg total) by mouth 2 (two) times daily with a meal. What changed: when to take this   folic acid 1 MG tablet Commonly known as: FOLVITE Take 1 tablet (1 mg total) by mouth daily.   ketoconazole 2 % cream Commonly known as: NIZORAL Apply 1 application topically daily. To dry scaly skin on feet   loratadine 10 MG tablet Commonly known as: CLARITIN Take 1 tablet (10 mg total) by mouth daily.   Magnesium Oxide 400 MG Caps Take 1 capsule (400 mg total) by mouth daily.   nitroGLYCERIN 0.4 MG SL tablet Commonly known as: NITROSTAT Place 1 tablet (0.4 mg total) under the tongue every 5 (five) minutes x 3 doses as needed for chest pain.   pantoprazole 40 MG tablet Commonly known as: PROTONIX Take 1 tablet (40 mg total) by mouth daily.   potassium chloride SA 20 MEQ tablet Commonly known as: KLOR-CON Take 1 tablet (20 mEq total) by mouth daily.   predniSONE 20 MG tablet Commonly known as: DELTASONE Take 1-2 tablets (20-40 mg total) by mouth daily with breakfast. Take 2 tablets (40mg ) daily x 2 weeks, then 1 tablet (20mg ) daily. Start taking on: October 18, 2020 What changed:  how much to take additional instructions   tamsulosin 0.4 MG Caps capsule Commonly known as: FLOMAX Take 1 capsule (0.4 mg total) by mouth every evening.   torsemide 20 MG tablet Commonly known as: DEMADEX Take 60 mg by mouth See admin instructions. TAKE 3 TABLETS BY MOUTH EVERY MORNING, MAY TAKE A SECOND DOSE OF 3 TABLETS IN THE AFTERNOON AS NEEDED FOR SWELLING   traZODone 50 MG tablet Commonly known as: DESYREL Take 1 tablet (50 mg total) by mouth at bedtime.       Allergies  Allergen Reactions    Penicillins Other (See Comments)    Tolerated Cephalosporin Date: 10/15/19. Allergy is from childhood Did it involve swelling of the face/tongue/throat, SOB, or low BP? Unknown Did it involve sudden or severe rash/hives, skin peeling, or any reaction on the inside of your mouth or nose? Unknown Did you need to seek medical attention at a hospital or doctor's office? Unknown When did it last happen?    childhood  reaction   If all above answers are "NO", may proceed with cephalosporin use.   Penicillins     Follow-up Information     Tisovec, Darryl Him, MD. Schedule an appointment as soon as possible for a visit in 2 week(s).   Specialty: Internal Medicine Contact information: [place] [address] [phone]         Sherren Mocha, MD .   Specialty: Cardiology Contact information: [phone] [address] [place] [address] [phone]         Deboraha Sprang, MD .   Specialty: Cardiology Contact information: [phone] [address] [place] [address] [phone]         Felipa Furnace, DPM. Schedule an appointment as soon as possible for a visit in 1 week(s).   Specialty: Podiatry Why: f/u in 1-2 weeks. Contact information: [address] [place] [address] [phone]                  The results of significant diagnostics from this hospitalization (including imaging, microbiology, ancillary and laboratory) are listed below for reference.    Significant Diagnostic Studies: MR FOOT LEFT WO CONTRAST  Result Date: 10/14/2020 CLINICAL DATA:  Foot swelling, nondiabetic, osteomyelitis suspected EXAM: MRI OF THE LEFT FOOT WITHOUT CONTRAST TECHNIQUE: Multiplanar, multisequence MR imaging of the left was performed. No intravenous contrast was administered. COMPARISON:  Left foot radiograph 10/13/2020 FINDINGS: Bones/Joint/Cartilage There is no evidence of acute fracture. There is mildly increased STIR signal in the  distal phalanx of the great toe. Ligaments Intact Lisfranc ligament.  No evidence of plantar plate tear Muscles and Tendons Diffuse muscle atrophy of the foot. Soft tissues Extensive, predominantly dorsal soft tissue swelling of the foot. There is a medial plantar soft tissue defect/ulceration along the distal aspect of the great toe. There is a small focus of susceptibility artifact in this region as well. IMPRESSION: Mildly increased edema signal within the distal phalanx of the great toe, which could represent reactive marrow change or early osteomyelitis. Adjacent soft tissue ulceration, swelling and focus of susceptibility artifact which may represent soft tissue gas or foreign body. Extensive, predominantly dorsal soft tissue swelling of the foot. Diffuse muscle atrophy in the foot. Electronically Signed   By: Maurine Simmering   On: 10/14/2020 15:27   DG Foot Complete Left  Result Date: 10/13/2020 CLINICAL DATA:  Infection. Wound infection. Patient reports stitches on great toe. EXAM: LEFT FOOT - COMPLETE 3+ VIEW COMPARISON:  None. FINDINGS: Surgical hardware in the distal fibula and tibia, partially included. No acute fracture. Mild hammertoe deformity of the digits. 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 There is no erosion, bony destruction, periosteal reaction, or abnormal bone density to suggest osteomyelitis. Skin and soft tissue irregularity noted about the medial and plantar aspect of the great toe. Dorsal soft tissue edema with occasional skin irregularity. No radiopaque foreign body or tracking soft tissue air. IMPRESSION: 1. Skin and soft tissue irregularity about the medial and plantar aspect of the great toe. No radiographic findings of osteomyelitis. 2. Dorsal soft tissue edema. Electronically Signed   By: Keith Rake M.D.   On: 10/13/2020 19:39   VAS [place] ABI WITH/WO TBI  Result Date: 10/15/2020  LOWER EXTREMITY DOPPLER STUDY Patient Name:  Darryl Diaz  Date of  Exam:   10/15/2020 Medical Rec #: [id number]         Accession #:    [id number] Date of Birth:  March 19, 1941         Patient Gender: M Patient Age:   80Y Exam Location:  [facility] Procedure:      VAS [place] ABI WITH/WO TBI Referring Phys: [id number] [place] --------------------------------------------------------------------------------  Indications: Ulceration. High Risk Factors: Hypertension.  Limitations: Today's exam was limited due to an open wound and bandages. Comparison Study: No prior studies. Performing Technologist: Carlos Levering RVT  Examination Guidelines: A complete evaluation includes at minimum, Doppler waveform signals and systolic blood pressure reading at the level of bilateral brachial, anterior tibial, and posterior tibial arteries, when vessel segments are accessible. Bilateral testing is considered an integral part of a complete examination. Photoelectric Plethysmograph (PPG) waveforms and toe systolic pressure readings are included as required and additional duplex testing as needed. Limited examinations for reoccurring indications may be performed as noted.  ABI Findings: +---------+------------------+-----+---------+--------+ Right    Rt Pressure (mmHg)IndexWaveform Comment  +---------+------------------+-----+---------+--------+ Brachial 151                    triphasic         +---------+------------------+-----+---------+--------+ PTA      219               1.45 triphasic         +---------+------------------+-----+---------+--------+ DP       223               1.48 triphasic         +---------+------------------+-----+---------+--------+ Great Toe157               1.04                   +---------+------------------+-----+---------+--------+ +--------+------------------+-----+---------+-------+ Left    Lt Pressure (mmHg)IndexWaveform Comment +--------+------------------+-----+---------+-------+ Brachial150                    triphasic         +--------+------------------+-----+---------+-------+ PTA     214               1.42 triphasic        +--------+------------------+-----+---------+-------+ DP      216               1.43 triphasic        +--------+------------------+-----+---------+-------+ +-------+-----------+-----------+------------+------------+ ABI/TBIToday's ABIToday's TBIPrevious ABIPrevious TBI +-------+-----------+-----------+------------+------------+ Right  1.48       1.04                                +-------+-----------+-----------+------------+------------+ Left   1.43                                           +-------+-----------+-----------+------------+------------+  Summary: Right: Resting right ankle-brachial index indicates noncompressible right lower extremity arteries. The right toe-brachial index is normal. Left: Resting left ankle-brachial index indicates noncompressible left lower extremity arteries. Unable to obtain TBI due to open wounds.  *See table(s) above for measurements and observations.  Electronically signed by Monica Martinez MD on 10/15/2020 at 7:11:54 PM.    Final     Microbiology: Recent Results (from the past 240 hour(s))  Resp Panel by RT-PCR (Flu A&B, Covid) Nasopharyngeal Swab     Status: None   Collection Time: 10/13/20 12:01 PM   Specimen: Nasopharyngeal Swab; Nasopharyngeal(NP) swabs in vial transport medium  Result Value Ref  Range Status   SARS Coronavirus 2 by RT PCR NEGATIVE NEGATIVE Final    Comment: (NOTE) SARS-CoV-2 target nucleic acids are NOT DETECTED.  The SARS-CoV-2 RNA is generally detectable in upper respiratory specimens during the acute phase of infection. The lowest concentration of SARS-CoV-2 viral copies this assay can detect is 138 copies/mL. A negative result does not preclude SARS-Cov-2 infection and should not be used as the sole basis for treatment or other patient management decisions. A negative result may occur with  improper  specimen collection/handling, submission of specimen other than nasopharyngeal swab, presence of viral mutation(s) within the areas targeted by this assay, and inadequate number of viral copies(<138 copies/mL). A negative result must be combined with clinical observations, patient history, and epidemiological information. The expected result is Negative.  Fact Sheet for Patients:  EntrepreneurPulse.com.au  Fact Sheet for Healthcare Providers:  IncredibleEmployment.be  This test is no t yet approved or cleared by the [place] FDA and  has been authorized for detection and/or diagnosis of SARS-CoV-2 by FDA under an Emergency Use Authorization (EUA). This EUA will remain  in effect (meaning this test can be used) for the duration of the COVID-19 declaration under Section 564(b)(1) of the Act, 21 U.S.C.section 360bbb-3(b)(1), unless the authorization is terminated  or revoked sooner.       Influenza A by PCR NEGATIVE NEGATIVE Final   Influenza B by PCR NEGATIVE NEGATIVE Final    Comment: (NOTE) The Xpert Xpress SARS-CoV-2/FLU/RSV plus assay is intended as an aid in the diagnosis of influenza from Nasopharyngeal swab specimens and should not be used as a sole basis for treatment. Nasal washings and aspirates are unacceptable for Xpert Xpress SARS-CoV-2/FLU/RSV testing.  Fact Sheet for Patients: EntrepreneurPulse.com.au  Fact Sheet for Healthcare Providers: IncredibleEmployment.be  This test is not yet approved or cleared by the [place] FDA and has been authorized for detection and/or diagnosis of SARS-CoV-2 by FDA under an Emergency Use Authorization (EUA). This EUA will remain in effect (meaning this test can be used) for the duration of the COVID-19 declaration under Section 564(b)(1) of the Act, 21 U.S.C. section 360bbb-3(b)(1), unless the authorization is terminated or revoked.  Performed at  [facility], [place] [address], [place], [place] [address]      Labs: Basic Metabolic Panel: Recent Labs  Lab 10/13/20 1218 10/14/20 0304 10/15/20 0340 10/16/20 0306 10/17/20 0211  NA 134* 137 139 138 138  K 3.5 3.6 4.0 4.1 4.0  CL 94* 96* 100 100 100  CO2 28 28 28 26 27   GLUCOSE 115* 213* 163* 107* 150*  BUN 126* 120* 107* 102* 98*  CREATININE 3.37* 3.24* 3.24* 2.93* 3.05*  CALCIUM 9.2 8.9 8.5* 8.5* 8.7*  PHOS  --   --  4.6 4.5 4.8*   Liver Function Tests: Recent Labs  Lab 10/13/20 1218 10/15/20 0340 10/16/20 0306 10/17/20 0211  AST 21  --   --   --   ALT 29  --   --   --   ALKPHOS 64  --   --   --   BILITOT 0.6  --   --   --   PROT 5.8*  --   --   --   ALBUMIN 3.2* 2.4* 2.3* 2.4*   No results for input(s): LIPASE, AMYLASE in the last 168 hours. No results for input(s): AMMONIA in the last 168 hours. CBC: Recent Labs  Lab 10/13/20 1218 10/14/20 0304 10/15/20 0340 10/15/20 1525 10/16/20 0306 10/17/20 0211  WBC 16.3* 14.9* 10.7* 11.0* 9.4 8.9  NEUTROABS 14.6*  --   --   --   --  7.4  HGB 7.7* 7.2* 7.4* 8.2* 7.8* 8.1*  HCT 25.2* 23.0* 22.9* 25.9* 24.0* 24.6*  MCV 103.7* 101.8* 98.7 100.8* 99.2 98.8  PLT 225 191 172 205 173 168   Cardiac Enzymes: No results for input(s): CKTOTAL, CKMB, CKMBINDEX, TROPONINI in the last 168 hours. BNP: BNP (last 3 results) Recent Labs    02/06/20 0916 02/22/20 1433 09/08/20 1953  BNP 381.9* 266.2* 710.7*    ProBNP (last 3 results) No results for input(s): PROBNP in the last 8760 hours.  CBG: Recent Labs  Lab 10/14/20 1626  GLUCAP 166*       Signed:  Irine Seal MD.  [facility] 10/17/2020, 1:35 PM

## 2020-10-17 NOTE — Discharge Instructions (Signed)

## 2020-10-17 NOTE — TOC Transition Note (Signed)
Transition of Care [facility]) - CM/SW Discharge Note   Patient Details  Name: Darryl Diaz MRN: [id number] Date of Birth: Jun 08, 1940  Transition of Care [facility]) CM/SW Contact:  Carles Collet, RN Phone Number: 10/17/2020, 1:50 PM   Clinical Narrative:   Damaris Schooner w patient at bedside.  He states that he has RW WC rollator and accessible bathroom at home. He states that his son is staying with him at [place]. He states that he would like to use [facility] for [facility] services, order placed and accepted by liaison. Ortho boot at bedside.   Son will provide transport home, patient is very anxious to leave as soon as possible.     Final next level of care: [place] Barriers to Discharge: No Barriers Identified   Patient Goals and CMS Choice Patient states their goals for this hospitalization and ongoing recovery are:: to return home CMS Medicare.gov Compare Post Acute Care list provided to:: Patient Choice offered to / list presented to : Patient  Discharge Placement                       Discharge Plan and Services                DME Arranged: N/A         HH Arranged: RN, PT, OT, Nurse's Aide [place] Agency: [place] Date [facility] Agency Contacted: 10/17/20 Time [place]: 1350 Representative spoke with at [place]: [place] Interventions     Readmission Risk Interventions Readmission Risk Prevention Plan 03/18/2020 03/05/2020 04/23/2019  Transportation Screening Complete Complete Complete  PCP or Specialist Appt within 5-7 Days - - Complete  Home Care Screening - - Complete  Medication Review (RN CM) - - Complete  HRI or Home Care Consult Complete Complete -  Social Work Consult for Industry Planning/Counseling Complete Complete -  Palliative Care Screening Not Applicable Not Applicable -  Medication Review Press photographer) Complete Complete -  Some recent data might be hidden

## 2020-10-19 DIAGNOSIS — H2512 Age-related nuclear cataract, left eye: Secondary | ICD-10-CM | POA: Diagnosis not present

## 2020-10-19 DIAGNOSIS — H40023 Open angle with borderline findings, high risk, bilateral: Secondary | ICD-10-CM | POA: Diagnosis not present

## 2020-10-19 DIAGNOSIS — H43813 Vitreous degeneration, bilateral: Secondary | ICD-10-CM | POA: Diagnosis not present

## 2020-10-19 DIAGNOSIS — H33031 Retinal detachment with giant retinal tear, right eye: Secondary | ICD-10-CM | POA: Diagnosis not present

## 2020-10-19 DIAGNOSIS — H35432 Paving stone degeneration of retina, left eye: Secondary | ICD-10-CM | POA: Diagnosis not present

## 2020-10-19 DIAGNOSIS — H35362 Drusen (degenerative) of macula, left eye: Secondary | ICD-10-CM | POA: Diagnosis not present

## 2020-10-19 DIAGNOSIS — Z961 Presence of intraocular lens: Secondary | ICD-10-CM | POA: Diagnosis not present

## 2020-10-20 DIAGNOSIS — N179 Acute kidney failure, unspecified: Secondary | ICD-10-CM | POA: Diagnosis not present

## 2020-10-20 DIAGNOSIS — G459 Transient cerebral ischemic attack, unspecified: Secondary | ICD-10-CM | POA: Diagnosis not present

## 2020-10-20 DIAGNOSIS — M47816 Spondylosis without myelopathy or radiculopathy, lumbar region: Secondary | ICD-10-CM | POA: Diagnosis not present

## 2020-10-20 DIAGNOSIS — I110 Hypertensive heart disease with heart failure: Secondary | ICD-10-CM | POA: Diagnosis not present

## 2020-10-20 DIAGNOSIS — I5033 Acute on chronic diastolic (congestive) heart failure: Secondary | ICD-10-CM | POA: Diagnosis not present

## 2020-10-20 DIAGNOSIS — M47815 Spondylosis without myelopathy or radiculopathy, thoracolumbar region: Secondary | ICD-10-CM | POA: Diagnosis not present

## 2020-10-21 ENCOUNTER — Other Ambulatory Visit: Payer: Self-pay

## 2020-10-21 DIAGNOSIS — I5032 Chronic diastolic (congestive) heart failure: Secondary | ICD-10-CM

## 2020-10-22 DIAGNOSIS — G459 Transient cerebral ischemic attack, unspecified: Secondary | ICD-10-CM | POA: Diagnosis not present

## 2020-10-22 DIAGNOSIS — M47816 Spondylosis without myelopathy or radiculopathy, lumbar region: Secondary | ICD-10-CM | POA: Diagnosis not present

## 2020-10-22 DIAGNOSIS — N179 Acute kidney failure, unspecified: Secondary | ICD-10-CM | POA: Diagnosis not present

## 2020-10-22 DIAGNOSIS — M47815 Spondylosis without myelopathy or radiculopathy, thoracolumbar region: Secondary | ICD-10-CM | POA: Diagnosis not present

## 2020-10-22 DIAGNOSIS — I5033 Acute on chronic diastolic (congestive) heart failure: Secondary | ICD-10-CM | POA: Diagnosis not present

## 2020-10-22 DIAGNOSIS — I110 Hypertensive heart disease with heart failure: Secondary | ICD-10-CM | POA: Diagnosis not present

## 2020-10-23 ENCOUNTER — Other Ambulatory Visit: Payer: Self-pay

## 2020-10-23 ENCOUNTER — Ambulatory Visit (INDEPENDENT_AMBULATORY_CARE_PROVIDER_SITE_OTHER): Payer: Medicare Other | Admitting: Podiatry

## 2020-10-23 ENCOUNTER — Other Ambulatory Visit: Payer: Self-pay | Admitting: *Deleted

## 2020-10-23 DIAGNOSIS — M545 Low back pain, unspecified: Secondary | ICD-10-CM | POA: Insufficient documentation

## 2020-10-23 DIAGNOSIS — I129 Hypertensive chronic kidney disease with stage 1 through stage 4 chronic kidney disease, or unspecified chronic kidney disease: Secondary | ICD-10-CM | POA: Diagnosis not present

## 2020-10-23 DIAGNOSIS — N057 Unspecified nephritic syndrome with diffuse crescentic glomerulonephritis: Secondary | ICD-10-CM | POA: Diagnosis not present

## 2020-10-23 DIAGNOSIS — N058 Unspecified nephritic syndrome with other morphologic changes: Secondary | ICD-10-CM | POA: Diagnosis not present

## 2020-10-23 DIAGNOSIS — R809 Proteinuria, unspecified: Secondary | ICD-10-CM | POA: Diagnosis not present

## 2020-10-23 DIAGNOSIS — L97522 Non-pressure chronic ulcer of other part of left foot with fat layer exposed: Secondary | ICD-10-CM

## 2020-10-23 DIAGNOSIS — L03116 Cellulitis of left lower limb: Secondary | ICD-10-CM | POA: Diagnosis not present

## 2020-10-23 DIAGNOSIS — F1010 Alcohol abuse, uncomplicated: Secondary | ICD-10-CM

## 2020-10-23 DIAGNOSIS — N189 Chronic kidney disease, unspecified: Secondary | ICD-10-CM | POA: Diagnosis not present

## 2020-10-23 DIAGNOSIS — N179 Acute kidney failure, unspecified: Secondary | ICD-10-CM | POA: Diagnosis not present

## 2020-10-23 DIAGNOSIS — E441 Mild protein-calorie malnutrition: Secondary | ICD-10-CM | POA: Insufficient documentation

## 2020-10-23 DIAGNOSIS — R600 Localized edema: Secondary | ICD-10-CM | POA: Diagnosis not present

## 2020-10-23 DIAGNOSIS — S0100XA Unspecified open wound of scalp, initial encounter: Secondary | ICD-10-CM | POA: Insufficient documentation

## 2020-10-23 DIAGNOSIS — E876 Hypokalemia: Secondary | ICD-10-CM | POA: Insufficient documentation

## 2020-10-23 DIAGNOSIS — B370 Candidal stomatitis: Secondary | ICD-10-CM | POA: Insufficient documentation

## 2020-10-23 DIAGNOSIS — N289 Disorder of kidney and ureter, unspecified: Secondary | ICD-10-CM | POA: Insufficient documentation

## 2020-10-23 DIAGNOSIS — R319 Hematuria, unspecified: Secondary | ICD-10-CM | POA: Diagnosis not present

## 2020-10-23 DIAGNOSIS — D631 Anemia in chronic kidney disease: Secondary | ICD-10-CM | POA: Diagnosis not present

## 2020-10-23 HISTORY — DX: Alcohol abuse, uncomplicated: F10.10

## 2020-10-23 NOTE — Patient Outreach (Signed)
[facility]) Care Management  10/23/2020  Darryl Diaz 01-19-1941 [id number]   [facility] Telephone Assessment/Screen for post hospital/snf/complex care referral  Referral Date: 10/22/20  Referral Source: [facility] hospital liaison Reason for Referral: Disease management services needed: Nurse Case Manager    Diagnoses of Heart Failure      Insurance: Medicare/AARP  Previous [facility] pt in November 2021 [facility] admissions x 2 in last 6 months Last admission 10/13/20- 10/17/20 cellulitis of great toe of left foot  5/2-8/22 AKI, microhematuria, TIA, CHF/edema  Outreach attempt # 1 successful to home number [phone]  Patient is able to verify HIPAA, DOB and address Reviewed and addressed referral to [facility] with patient Consent: [facility] RN CM reviewed [facility] services with patient. Patient gave verbal consent for services [facility] telephonic RN CM.    Patient seen by cardiology and podiatry (Keith Rake) today [facility] home health services are active Denies swelling, pain, drainage of leg foot Confirms discontinue use of silvadene and torsemide vs lasix Denies care coordination needs at this time With review of transition of care assessment no needs identified He reports "I'm all good here"  Social: 80 yr old male lives alone but with support of his son, Quillian Quince and sister [name] His son stayed with him after his discharge Independent with care needs He moved to [place] 6 years ago Denies social determinants of health (SDOH) concerns History of skilled nursing facility (snf) stay at [facility] in October 2021 after GI bleed  Past Medical History:  Diagnosis Date   Alcoholism ([place])    in remission following wife's death   Anemia    Ascending aorta dilation (HCC)    Bradycardia    low 20's   Bradycardic cardiac arrest [facility])    Cardiac arrest ([place]) 04/20/2019   12 min CPR with epi   Chronic diastolic CHF (congestive heart failure) ([place])    Fall at home 03/28/2016   Fatty liver     Gastritis    GERD (gastroesophageal reflux disease)    H/O seasonal allergies    History of GI bleed    Hx of adenomatous colonic polyps 08/10/2017   Hyperlipidemia    Hypertension    Hypotension    Insomnia    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   Mallory-Weiss tear    OA (osteoarthritis)    hands   OSA (obstructive sleep apnea)    No longer uses CPAP   Persistent atrial fibrillation with rapid ventricular response ([place]) 02/05/2020   Recurrent syncope    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[facility])    Past Surgical History:  Procedure Laterality Date   ANKLE SURGERY Left 2014   had rods put in    BIOPSY  02/28/2019   Procedure: BIOPSY;  Surgeon: Milus Banister, MD;  Location: [place];  Service: Endoscopy;;   CARDIOVERSION  02/06/2020   CARDIOVERSION N/A 02/06/2020   Procedure: CARDIOVERSION;  Surgeon: Werner Lean, MD;  Location: [place] ENDOSCOPY;  Service: Cardiovascular;  Laterality: N/A;   CARDIOVERSION N/A 02/24/2020   Procedure: CARDIOVERSION;  Surgeon: Werner Lean, MD;  Location: [facility] ENDOSCOPY;  Service: Cardiovascular;  Laterality: N/A;   COLONOSCOPY     [place]   ESOPHAGOGASTRODUODENOSCOPY (EGD) WITH PROPOFOL N/A 02/28/2019   Procedure: ESOPHAGOGASTRODUODENOSCOPY (EGD) WITH PROPOFOL;  Surgeon: Milus Banister, MD;  Location: [facility] ENDOSCOPY;  Service: Endoscopy;  Laterality: N/A;   HIP ARTHROPLASTY Left 04/01/2016   Procedure: ARTHROPLASTY BIPOLAR HIP (HEMIARTHROPLASTY);  Surgeon: Paralee Cancel, MD;  Location: [place];  Service: Orthopedics;  Laterality: Left;   HIP CLOSED REDUCTION Left 04/30/2016   Procedure: CLOSED REDUCTION HIP;  Surgeon: Wylene Simmer, MD;  Location: [place];  Service: Orthopedics;  Laterality: Left;   TONSILLECTOMY     TOTAL HIP ARTHROPLASTY Right 10/15/2019   Procedure: TOTAL HIP ARTHROPLASTY ANTERIOR APPROACH;  Surgeon: Paralee Cancel, MD;  Location: [facility];  Service: Orthopedics;  Laterality: Right;  70 mins     DME: RW, w/c  rollator, accessible bathroom, Ortho [facility] boot glasses  Medications:denies concerns with taking medications as prescribed, affording medications, side effects of medications and questions about medications   Falls 03/24/2020 Was seen in the ER for a fall, fell in March/April 2022  Plan:  Completed [facility] multidisciplinary care discussion template to send to [facility] CMA Patient agrees to the care plan and follow up [place] will follow up in 14-21 business days as agreed  [facility] L. Lavina Hamman, RN, BSN, [place] Coordinator Office number [phone] Mobile number [phone]  Main [facility] number [phone] Fax number [phone]

## 2020-10-26 ENCOUNTER — Telehealth: Payer: Self-pay | Admitting: Podiatry

## 2020-10-26 ENCOUNTER — Ambulatory Visit: Payer: Medicare Other | Admitting: Podiatry

## 2020-10-26 DIAGNOSIS — M47815 Spondylosis without myelopathy or radiculopathy, thoracolumbar region: Secondary | ICD-10-CM | POA: Diagnosis not present

## 2020-10-26 DIAGNOSIS — G459 Transient cerebral ischemic attack, unspecified: Secondary | ICD-10-CM | POA: Diagnosis not present

## 2020-10-26 DIAGNOSIS — N179 Acute kidney failure, unspecified: Secondary | ICD-10-CM | POA: Diagnosis not present

## 2020-10-26 DIAGNOSIS — I110 Hypertensive heart disease with heart failure: Secondary | ICD-10-CM | POA: Diagnosis not present

## 2020-10-26 DIAGNOSIS — I5033 Acute on chronic diastolic (congestive) heart failure: Secondary | ICD-10-CM | POA: Diagnosis not present

## 2020-10-26 DIAGNOSIS — M47816 Spondylosis without myelopathy or radiculopathy, lumbar region: Secondary | ICD-10-CM | POA: Diagnosis not present

## 2020-10-26 MED ORDER — SANTYL 250 UNIT/GM EX OINT: 1.0000 "application " | TOPICAL_OINTMENT | Freq: Every day | CUTANEOUS | 0 refills | Status: DC

## 2020-10-26 NOTE — Telephone Encounter (Signed)
Patient is requesting prescription cream for wound care. Please advise

## 2020-10-26 NOTE — Addendum Note (Signed)
Addended by: Boneta Lucks on: 10/26/2020 03:07 PM   Modules accepted: Orders

## 2020-10-28 ENCOUNTER — Ambulatory Visit (INDEPENDENT_AMBULATORY_CARE_PROVIDER_SITE_OTHER): Payer: Medicare Other | Admitting: Podiatry

## 2020-10-28 ENCOUNTER — Other Ambulatory Visit: Payer: Self-pay

## 2020-10-28 ENCOUNTER — Encounter: Payer: Self-pay | Admitting: Podiatry

## 2020-10-28 DIAGNOSIS — L97522 Non-pressure chronic ulcer of other part of left foot with fat layer exposed: Secondary | ICD-10-CM

## 2020-10-28 DIAGNOSIS — E11621 Type 2 diabetes mellitus with foot ulcer: Secondary | ICD-10-CM

## 2020-10-28 DIAGNOSIS — L03032 Cellulitis of left toe: Secondary | ICD-10-CM | POA: Diagnosis not present

## 2020-10-28 DIAGNOSIS — L97512 Non-pressure chronic ulcer of other part of right foot with fat layer exposed: Secondary | ICD-10-CM

## 2020-10-28 DIAGNOSIS — N289 Disorder of kidney and ureter, unspecified: Secondary | ICD-10-CM | POA: Diagnosis not present

## 2020-10-28 NOTE — Progress Notes (Signed)
Subjective:  Patient ID: Darryl Diaz, male    DOB: 1940-10-02,  MRN: [id number]  Chief Complaint  Patient presents with   Nail Problem    Nail trim /callus     80 y.o. male presents for wound care.  Patient presents with new complaint of left hallux ulceration wound as well as left dorsal midfoot wound.  Patient was seen by me at the hospital for which she had a work-up without any involvement of bone infection.  He states that these wounds have been present for a long time and would like to get it evaluated.  He has been doing local wound care none of which has helped.  They have gotten progressively worse.  He is known to Dr. Sherryle Lis who was treating it for routine foot care.  He is also interested in undergoing graft to close of the wound faster.  He denies any other acute complaints.   Review of Systems: Negative except as noted in the HPI. Denies N/V/F/Ch.  Past Medical History:  Diagnosis Date   Alcoholism ([place])    in remission following wife's death   Anemia    Ascending aorta dilation (HCC)    Bradycardia    low 20's   Bradycardic cardiac arrest [facility])    Cardiac arrest ([place]) 04/20/2019   12 min CPR with epi   Chronic diastolic CHF (congestive heart failure) ([place])    Fall at home 03/28/2016   Fatty liver    Gastritis    GERD (gastroesophageal reflux disease)    H/O seasonal allergies    History of GI bleed    Hx of adenomatous colonic polyps 08/10/2017   Hyperlipidemia    Hypertension    Hypotension    Insomnia    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   Mallory-Weiss tear    OA (osteoarthritis)    hands   OSA (obstructive sleep apnea)    No longer uses CPAP   Persistent atrial fibrillation with rapid ventricular response ([place]) 02/05/2020   Recurrent syncope    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(HCC)     Current Outpatient Medications:    acetaminophen (TYLENOL) 325 MG tablet, Take 2 tablets (650 mg total) by mouth every 4 (four) hours as needed for  headache or mild pain., Disp: , Rfl:    amiodarone (PACERONE) 200 MG tablet, Take 1 tablet (200 mg total) by mouth daily., Disp: 90 tablet, Rfl: 3   amLODipine (NORVASC) 5 MG tablet, Take 1 tablet (5 mg total) by mouth daily., Disp: 30 tablet, Rfl: 0   apixaban (ELIQUIS) 5 MG TABS tablet, Take 1 tablet (5 mg total) by mouth 2 (two) times daily., Disp: , Rfl:    atorvastatin (LIPITOR) 20 MG tablet, Take 1 tablet (20 mg total) by mouth daily., Disp: 30 tablet, Rfl: 0   collagenase (SANTYL) ointment, Apply 1 application topically daily., Disp: 15 g, Rfl: 0   dapsone 100 MG tablet, Take 100 mg by mouth daily., Disp: , Rfl:    dapsone 25 MG tablet, , Disp: , Rfl:    doxycycline (ADOXA) 100 MG tablet, Take 1 tablet (100 mg total) by mouth 2 (two) times daily for 14 days., Disp: 28 tablet, Rfl: 0   doxycycline (VIBRA-TABS) 100 MG tablet, TAKE 1 TABLET BY MOUTH TWICE A DAY FOR 14 DAYS, Disp: , Rfl:    escitalopram (LEXAPRO) 10 MG tablet, Take 1 tablet (10 mg total) by mouth daily., Disp: 30 tablet, Rfl: 0   ferrous sulfate 325 (  65 FE) MG tablet, Take 1 tablet (325 mg total) by mouth 2 (two) times daily with a meal. (Patient taking differently: Take 325 mg by mouth daily.), Disp: 60 tablet, Rfl: 0   folic acid (FOLVITE) 1 MG tablet, Take 1 tablet (1 mg total) by mouth daily., Disp: 30 tablet, Rfl: 0   ketoconazole (NIZORAL) 2 % cream, Apply 1 application topically daily. To dry scaly skin on feet, Disp: 60 g, Rfl: 2   loratadine (CLARITIN) 10 MG tablet, Take 1 tablet (10 mg total) by mouth daily., Disp: 30 tablet, Rfl: 0   magnesium oxide (MAG-OX) 400 (241.3 Mg) MG tablet, Take 1 tablet by mouth daily., Disp: , Rfl:    Magnesium Oxide 400 MG CAPS, Take 1 capsule (400 mg total) by mouth daily., Disp: , Rfl:    Multiple Vitamins-Minerals (CENTRUM SILVER 50+MEN) TABS, Take 1 tablet by mouth daily with breakfast., Disp: , Rfl:    nitroGLYCERIN (NITROSTAT) 0.4 MG SL tablet, Place 1 tablet (0.4 mg total) under  the tongue every 5 (five) minutes x 3 doses as needed for chest pain., Disp: 30 tablet, Rfl: 12   pantoprazole (PROTONIX) 40 MG tablet, Take 1 tablet (40 mg total) by mouth daily., Disp: 30 tablet, Rfl: 0   potassium chloride SA (KLOR-CON) 20 MEQ tablet, Take 1 tablet (20 mEq total) by mouth daily., Disp: 90 tablet, Rfl: 3   predniSONE (DELTASONE) 20 MG tablet, Take 1-2 tablets (20-40 mg total) by mouth daily with breakfast. Take 2 tablets (40mg ) daily x 2 weeks, then 1 tablet (20mg ) daily., Disp: 60 tablet, Rfl: 1   tamsulosin (FLOMAX) 0.4 MG CAPS capsule, Take 1 capsule (0.4 mg total) by mouth every evening., Disp: 30 capsule, Rfl: 0   torsemide (DEMADEX) 20 MG tablet, Take 60 mg by mouth See admin instructions. TAKE 3 TABLETS BY MOUTH EVERY MORNING, MAY TAKE A SECOND DOSE OF 3 TABLETS IN THE AFTERNOON AS NEEDED FOR SWELLING, Disp: , Rfl:    Torsemide 60 MG TABS, take 3 tables, Disp: , Rfl:    traZODone (DESYREL) 50 MG tablet, Take 1 tablet (50 mg total) by mouth at bedtime., Disp: 30 tablet, Rfl: 0 No current facility-administered medications for this visit.  Facility-Administered Medications Ordered in Other Visits:    metoprolol tartrate (LOPRESSOR) injection, , , Anesthesia Intra-op, Mariea Clonts, CRNA, 5 mg at 02/27/19 1318  Social History   Tobacco Use  Smoking Status Never  Smokeless Tobacco Never  Tobacco Comments      quit 20 years  smoked on and off for 20 years    Allergies  Allergen Reactions   Penicillins Other (See Comments)    Tolerated Cephalosporin Date: 10/15/19. Allergy is from childhood Did it involve swelling of the face/tongue/throat, SOB, or low BP? Unknown Did it involve sudden or severe rash/hives, skin peeling, or any reaction on the inside of your mouth or nose? Unknown Did you need to seek medical attention at a hospital or doctor's office? Unknown When did it last happen?    childhood reaction   If all above answers are "NO", may proceed with  cephalosporin use.   Penicillins    Objective:  There were no vitals filed for this visit. There is no height or weight on file to calculate BMI. Constitutional Well developed. Well nourished.  Vascular Dorsalis pedis pulses palpable bilaterally. Posterior tibial pulses palpable bilaterally. Capillary refill normal to all digits.  No cyanosis or clubbing noted. Pedal hair growth normal.  Neurologic Normal speech.  Oriented to person, place, and time. Protective sensation absent  Dermatologic Wound Location: Left hallux ulceration with fat layer exposed.  Granular wound bed noted.  No malodor present no purulent drainage noted.  No probing down to bone noted. Wound Base: Granular/Healthy Peri-wound: Normal Exudate: Scant/small amount Serous exudate Wound Measurements: -See below  Left dorsal midfoot wound probing down to deep tissue.  Does not involve bone.  No purulent drainage was expressed.  No clinical signs of infection noted. Wound Base: Mixed Granular/Fibrotic Peri-wound: Normal Exudate: Scant/small amount Serosanguinous exudate Wound Measurements: -See below  Orthopedic: No pain to palpation either foot.   Radiographs: None Assessment:   1. Ulcer of left foot with fat layer exposed ([place])    Plan:  Patient was evaluated and treated and all questions answered.  Ulcer left dorsal midfoot wound and left hallux wound -Debridement as below. -Dressed with Betadine wet-to-dry, DSD. -Continue off-loading with surgical shoe. -Given that patient has failed conservative wound care for greater than 4 weeks without any relief and was hospitalized previously for getting infected of this foot I believe patient will benefit fromGraft application given the nature of the wounds. -I will work on getting organogenesis graft approval.  And I will see him back again next week.  Procedure: Excisional Debridement of Wound left hallux ulcer fat layer exposed Tool: Sharp chisel blade/tissue  nipper Rationale: Removal of non-viable soft tissue from the wound to promote healing.  Anesthesia: none Pre-Debridement Wound Measurements: 2.5 cm x 2 cm x 0.3 cm  Post-Debridement Wound Measurements: 2.7 cm x 2.3 cm x 0.3 cm  Type of Debridement: Sharp Excisional Tissue Removed: Non-viable soft tissue Blood loss: Minimal (<50cc) Depth of Debridement: subcutaneous tissue. Technique: Sharp excisional debridement to bleeding, viable wound base.  Wound Progress: This is my initial evaluation I will continue monitor the progression of it. Site healing conversation 7 Dressing: Dry, sterile, compression dressing. Disposition: Patient tolerated procedure well. Patient to return in 1 week for follow-up.  Procedure: Excisional Debridement of Wound left dorsal midfoot wound with tunneling Tool: Sharp chisel blade/tissue nipper Rationale: Removal of non-viable soft tissue from the wound to promote healing.  Anesthesia: none Pre-Debridement Wound Measurements: 2 cm x 1.5 cm x 0.5 cm  Post-Debridement Wound Measurements: 2.3 cm x 1.7 cm x 0.6 cm  Type of Debridement: Sharp Excisional Tissue Removed: Non-viable soft tissue Blood loss: Minimal (<50cc) Depth of Debridement: subcutaneous tissue. Technique: Sharp excisional debridement to bleeding, viable wound base.  Wound Progress: This 32 my initial evaluation in outpatient setting.  Patient does have some tunneling present as well Site healing conversation 7 Dressing: Dry, sterile, compression dressing. Disposition: Patient tolerated procedure well. Patient to return in 1 week for follow-up.  No follow-ups on file.

## 2020-10-30 DIAGNOSIS — M47816 Spondylosis without myelopathy or radiculopathy, lumbar region: Secondary | ICD-10-CM | POA: Diagnosis not present

## 2020-10-30 DIAGNOSIS — G459 Transient cerebral ischemic attack, unspecified: Secondary | ICD-10-CM | POA: Diagnosis not present

## 2020-10-30 DIAGNOSIS — N179 Acute kidney failure, unspecified: Secondary | ICD-10-CM | POA: Diagnosis not present

## 2020-10-30 DIAGNOSIS — M47815 Spondylosis without myelopathy or radiculopathy, thoracolumbar region: Secondary | ICD-10-CM | POA: Diagnosis not present

## 2020-10-30 DIAGNOSIS — I110 Hypertensive heart disease with heart failure: Secondary | ICD-10-CM | POA: Diagnosis not present

## 2020-10-30 DIAGNOSIS — I5033 Acute on chronic diastolic (congestive) heart failure: Secondary | ICD-10-CM | POA: Diagnosis not present

## 2020-11-03 ENCOUNTER — Encounter: Payer: Self-pay | Admitting: Podiatry

## 2020-11-03 NOTE — Progress Notes (Signed)
Subjective:  Patient ID: Darryl Diaz, male    DOB: [birth year],  MRN: [id number]  Chief Complaint  Patient presents with   Wound Check    Wound care     80 y.o. male presents for wound care.  Patient presents with new complaint of left hallux ulceration wound as well as left dorsal midfoot wound.  Patient was seen by me at the hospital for which she had a work-up without any involvement of bone infection.  He states that these wounds have been present for a long time and would like to get it evaluated.  He has been doing local wound care none of which has helped.  They have gotten progressively worse.  He is known to Dr. Sherryle Lis who was treating it for routine foot care.  He is also interested in undergoing graft to close of the wound faster.  He denies any other acute complaints.   Review of Systems: Negative except as noted in the HPI. Denies N/V/F/Ch.  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   Anemia    Ascending aorta dilation (HCC)    Bradycardia    low 20's   Bradycardic cardiac arrest [facility])    Cardiac arrest ([place]) 04/20/2019   12 min CPR with epi   Chronic diastolic CHF (congestive heart failure) ([place])    Fall at home 03/28/2016   Fatty liver    Gastritis    GERD (gastroesophageal reflux disease)    H/O seasonal allergies    History of GI bleed    Hx of adenomatous colonic polyps 08/10/2017   Hyperlipidemia    Hypertension    Hypotension    Insomnia    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   Mallory-Weiss tear    OA (osteoarthritis)    hands   OSA (obstructive sleep apnea)    No longer uses CPAP   Persistent atrial fibrillation with rapid ventricular response ([place]) 02/05/2020   Recurrent syncope    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(HCC)     Current Outpatient Medications:    acetaminophen (TYLENOL) 325 MG tablet, Take 2 tablets (650 mg total) by mouth every 4 (four) hours as needed for headache or  mild pain., Disp: , Rfl:    amiodarone (PACERONE) 200 MG tablet, Take 1 tablet (200 mg total) by mouth daily., Disp: 90 tablet, Rfl: 3   amLODipine (NORVASC) 5 MG tablet, Take 1 tablet (5 mg total) by mouth daily., Disp: 30 tablet, Rfl: 0   apixaban (ELIQUIS) 5 MG TABS tablet, Take 1 tablet (5 mg total) by mouth 2 (two) times daily., Disp: , Rfl:    atorvastatin (LIPITOR) 20 MG tablet, Take 1 tablet (20 mg total) by mouth daily., Disp: 30 tablet, Rfl: 0   collagenase (SANTYL) ointment, Apply 1 application topically daily., Disp: 15 g, Rfl: 0   dapsone 100 MG tablet, Take 100 mg by mouth daily., Disp: , Rfl:    dapsone 25 MG tablet, , Disp: , Rfl:    doxycycline (VIBRA-TABS) 100 MG tablet, TAKE 1 TABLET BY MOUTH TWICE A DAY FOR 14 DAYS, Disp: , Rfl:    escitalopram (LEXAPRO) 10 MG tablet, Take 1 tablet (10 mg total) by mouth daily., Disp: 30 tablet, Rfl: 0   ferrous sulfate 325 (65 FE) MG tablet, Take 1 tablet (325 mg total) by mouth 2 (two) times daily with a meal. (Patient taking differently: Take 325 mg by mouth daily.),  Disp: 60 tablet, Rfl: 0   folic acid (FOLVITE) 1 MG tablet, Take 1 tablet (1 mg total) by mouth daily., Disp: 30 tablet, Rfl: 0   ketoconazole (NIZORAL) 2 % cream, Apply 1 application topically daily. To dry scaly skin on feet, Disp: 60 g, Rfl: 2   loratadine (CLARITIN) 10 MG tablet, Take 1 tablet (10 mg total) by mouth daily., Disp: 30 tablet, Rfl: 0   magnesium oxide (MAG-OX) 400 (241.3 Mg) MG tablet, Take 1 tablet by mouth daily., Disp: , Rfl:    Magnesium Oxide 400 MG CAPS, Take 1 capsule (400 mg total) by mouth daily., Disp: , Rfl:    Multiple Vitamins-Minerals (CENTRUM SILVER 50+MEN) TABS, Take 1 tablet by mouth daily with breakfast., Disp: , Rfl:    nitroGLYCERIN (NITROSTAT) 0.4 MG SL tablet, Place 1 tablet (0.4 mg total) under the tongue every 5 (five) minutes x 3 doses as needed for chest pain., Disp: 30 tablet, Rfl: 12   pantoprazole (PROTONIX) 40 MG tablet, Take 1  tablet (40 mg total) by mouth daily., Disp: 30 tablet, Rfl: 0   potassium chloride SA (KLOR-CON) 20 MEQ tablet, Take 1 tablet (20 mEq total) by mouth daily., Disp: 90 tablet, Rfl: 3   predniSONE (DELTASONE) 20 MG tablet, Take 1-2 tablets (20-40 mg total) by mouth daily with breakfast. Take 2 tablets (40mg ) daily x 2 weeks, then 1 tablet (20mg ) daily., Disp: 60 tablet, Rfl: 1   tamsulosin (FLOMAX) 0.4 MG CAPS capsule, Take 1 capsule (0.4 mg total) by mouth every evening., Disp: 30 capsule, Rfl: 0   torsemide (DEMADEX) 20 MG tablet, Take 60 mg by mouth See admin instructions. TAKE 3 TABLETS BY MOUTH EVERY MORNING, MAY TAKE A SECOND DOSE OF 3 TABLETS IN THE AFTERNOON AS NEEDED FOR SWELLING, Disp: , Rfl:    Torsemide 60 MG TABS, take 3 tables, Disp: , Rfl:    traZODone (DESYREL) 50 MG tablet, Take 1 tablet (50 mg total) by mouth at bedtime., Disp: 30 tablet, Rfl: 0 No current facility-administered medications for this visit.  Facility-Administered Medications Ordered in Other Visits:    metoprolol tartrate (LOPRESSOR) injection, , , Anesthesia Intra-op, Mariea Clonts, CRNA, 5 mg at 02/27/19 1318  Social History   Tobacco Use  Smoking Status Never  Smokeless Tobacco Never  Tobacco Comments      quit 20 years  smoked on and off for 20 years    Allergies  Allergen Reactions   Penicillins Other (See Comments)    Tolerated Cephalosporin Date: 10/15/19. Allergy is from childhood Did it involve swelling of the face/tongue/throat, SOB, or low BP? Unknown Did it involve sudden or severe rash/hives, skin peeling, or any reaction on the inside of your mouth or nose? Unknown Did you need to seek medical attention at a hospital or doctor's office? Unknown When did it last happen?    childhood reaction   If all above answers are "NO", may proceed with cephalosporin use.   Penicillins    Objective:  There were no vitals filed for this visit. There is no height or weight on file to calculate  BMI. Constitutional Well developed. Well nourished.  Vascular Dorsalis pedis pulses palpable bilaterally. Posterior tibial pulses palpable bilaterally. Capillary refill normal to all digits.  No cyanosis or clubbing noted. Pedal hair growth normal.  Neurologic Normal speech. Oriented to person, place, and time. Protective sensation absent  Dermatologic Wound Location: Left hallux ulceration with fat layer exposed.  Granular wound bed noted.  No malodor  present no purulent drainage noted.  No probing down to bone noted. Wound Base: Granular/Healthy Peri-wound: Normal Exudate: Scant/small amount Serous exudate Wound Measurements: -See below  Left dorsal midfoot wound probing down to deep tissue.  Does not involve bone.  No purulent drainage was expressed.  No clinical signs of infection noted. Wound Base: Mixed Granular/Fibrotic Peri-wound: Normal Exudate: Scant/small amount Serosanguinous exudate Wound Measurements: -See below  Orthopedic: No pain to palpation either foot.   Radiographs: None Assessment:   1. Diabetic ulcer of toe of right foot associated with type 2 diabetes mellitus, with fat layer exposed ([place])   2. Diabetic ulcer of toe of left foot associated with type 2 diabetes mellitus, with fat layer exposed ([place])     Plan:  Patient was evaluated and treated and all questions answered.  Ulcer left dorsal midfoot wound and left hallux wound -Debridement as below. -Dressed with Betadine wet-to-dry, DSD. -Continue off-loading with surgical shoe. -Wound continue doing graft application and to resolve meant of the wound.  Application of synthetic skin graft/substitute x2 Name: Organogenesis PuraPly XT lot [id number] expiration date 2023-0420 5x2 Usage: 4.91 cm x 4.9 cm graft x 2 was applied directly to the listed above wound site and secured with Adaptic, kerlix, Ace bandage. Waste: No graft material was wasted and was applied in its entirety.   Procedure:  Excisional Debridement of Wound left hallux ulcer fat layer exposed Tool: Sharp chisel blade/tissue nipper Rationale: Removal of non-viable soft tissue from the wound to promote healing.  Anesthesia: none Pre-Debridement Wound Measurements: 2.5 cm x 2 cm x 0.3 cm  Post-Debridement Wound Measurements: 2.7 cm x 2.3 cm x 0.3 cm  Type of Debridement: Sharp Excisional Tissue Removed: Non-viable soft tissue Blood loss: Minimal (<50cc) Depth of Debridement: subcutaneous tissue. Technique: Sharp excisional debridement to bleeding, viable wound base.  Wound Progress: This is my initial evaluation I will continue monitor the progression of it. Site healing conversation 7 Dressing: Dry, sterile, compression dressing. Disposition: Patient tolerated procedure well. Patient to return in 1 week for follow-up.  Procedure: Excisional Debridement of Wound left dorsal midfoot wound with tunneling Tool: Sharp chisel blade/tissue nipper Rationale: Removal of non-viable soft tissue from the wound to promote healing.  Anesthesia: none Pre-Debridement Wound Measurements: 2 cm x 1.5 cm x 0.5 cm  Post-Debridement Wound Measurements: 2.3 cm x 1.7 cm x 0.6 cm  Type of Debridement: Sharp Excisional Tissue Removed: Non-viable soft tissue Blood loss: Minimal (<50cc) Depth of Debridement: subcutaneous tissue. Technique: Sharp excisional debridement to bleeding, viable wound base.  Wound Progress: This 65 my initial evaluation in outpatient setting.  Patient does have some tunneling present as well Site healing conversation 7 Dressing: Dry, sterile, compression dressing. Disposition: Patient tolerated procedure well. Patient to return in 1 week for follow-up.  No follow-ups on file.

## 2020-11-06 ENCOUNTER — Other Ambulatory Visit: Payer: Self-pay | Admitting: *Deleted

## 2020-11-06 ENCOUNTER — Ambulatory Visit (INDEPENDENT_AMBULATORY_CARE_PROVIDER_SITE_OTHER): Payer: Medicare Other | Admitting: Podiatry

## 2020-11-06 ENCOUNTER — Other Ambulatory Visit: Payer: Self-pay

## 2020-11-06 DIAGNOSIS — L97522 Non-pressure chronic ulcer of other part of left foot with fat layer exposed: Secondary | ICD-10-CM

## 2020-11-06 DIAGNOSIS — E11621 Type 2 diabetes mellitus with foot ulcer: Secondary | ICD-10-CM | POA: Diagnosis not present

## 2020-11-06 DIAGNOSIS — L97512 Non-pressure chronic ulcer of other part of right foot with fat layer exposed: Secondary | ICD-10-CM

## 2020-11-06 NOTE — Patient Outreach (Signed)
[facility]) Care Management  11/06/2020  Darryl Diaz 04/14/1941 [id number]   [facility] Unsuccessful outreach for post hospital/snf/complex care referral   Referral Date: 10/22/20 Referral Source: [facility] hospital liaison Reason for Referral: Disease management services needed: Nurse Case Manager    Diagnoses of Heart Failure      Insurance: Medicare/AARP   Previous [facility] pt in November 2021 [facility] admissions x 2 in last 6 months Last admission 10/13/20- 10/17/20 cellulitis of great toe of left foot 5/2-8/22 AKI, microhematuria, TIA, CHF/edema    Outreach attempt to the home number  No answer. Unable to leave a HIPAA compliant voice message     Social: 80 yr old male lives alone but with support of his son, Quillian Quince and sister [name] His son stayed with him after his discharge Independent with care needs He moved to [place] 6 years ago Denies social determinants of health (SDOH) concerns History of skilled nursing facility (snf) stay at [facility] in October 2021 after GI bleed  Plan: [facility] RN CM scheduled this patient for another call attempt within 4-7 business days  [place] L. Lavina Hamman, RN, BSN, [place] Coordinator Office number [phone] Mobile number [phone]  Main [facility] number [phone] Fax number [phone]

## 2020-11-09 DIAGNOSIS — Z20822 Contact with and (suspected) exposure to covid-19: Secondary | ICD-10-CM | POA: Diagnosis not present

## 2020-11-10 ENCOUNTER — Telehealth: Payer: Self-pay | Admitting: *Deleted

## 2020-11-10 ENCOUNTER — Encounter: Payer: Self-pay | Admitting: Podiatry

## 2020-11-10 NOTE — Progress Notes (Signed)
Subjective:  Patient ID: Darryl Diaz, male    DOB: 1941-01-16,  MRN: [id number]  Chief Complaint  Patient presents with   Diabetic Ulcer    Ulcer follow up     80 y.o. male presents for wound care.  Patient presents with complaint of left hallux ulceration as well as left dorsal midfoot wound.  Patient is here for graft application.  He denies any other acute complaints.  We will continue to apply graft and to resolve meant.   Review of Systems: Negative except as noted in the HPI. Denies N/V/F/Ch.  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   Anemia    Ascending aorta dilation (HCC)    Bradycardia    low 20's   Bradycardic cardiac arrest [facility])    Cardiac arrest ([place]) 04/20/2019   12 min CPR with epi   Chronic diastolic CHF (congestive heart failure) ([place])    Fall at home 03/28/2016   Fatty liver    Gastritis    GERD (gastroesophageal reflux disease)    H/O seasonal allergies    History of GI bleed    Hx of adenomatous colonic polyps 08/10/2017   Hyperlipidemia    Hypertension    Hypotension    Insomnia    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   Mallory-Weiss tear    OA (osteoarthritis)    hands   OSA (obstructive sleep apnea)    No longer uses CPAP   Persistent atrial fibrillation with rapid ventricular response ([place]) 02/05/2020   Recurrent syncope    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(HCC)     Current Outpatient Medications:    acetaminophen (TYLENOL) 325 MG tablet, Take 2 tablets (650 mg total) by mouth every 4 (four) hours as needed for headache or mild pain., Disp: , Rfl:    amiodarone (PACERONE) 200 MG tablet, Take 1 tablet (200 mg total) by mouth daily., Disp: 90 tablet, Rfl: 3   amLODipine (NORVASC) 5 MG tablet, Take 1 tablet (5 mg total) by mouth daily., Disp: 30 tablet, Rfl: 0   apixaban (ELIQUIS) 5 MG TABS tablet, Take 1 tablet (5 mg total) by mouth 2 (two) times daily., Disp: , Rfl:     atorvastatin (LIPITOR) 20 MG tablet, Take 1 tablet (20 mg total) by mouth daily., Disp: 30 tablet, Rfl: 0   collagenase (SANTYL) ointment, Apply 1 application topically daily., Disp: 15 g, Rfl: 0   dapsone 100 MG tablet, Take 100 mg by mouth daily., Disp: , Rfl:    dapsone 25 MG tablet, , Disp: , Rfl:    doxycycline (VIBRA-TABS) 100 MG tablet, TAKE 1 TABLET BY MOUTH TWICE A DAY FOR 14 DAYS, Disp: , Rfl:    escitalopram (LEXAPRO) 10 MG tablet, Take 1 tablet (10 mg total) by mouth daily., Disp: 30 tablet, Rfl: 0   ferrous sulfate 325 (65 FE) MG tablet, Take 1 tablet (325 mg total) by mouth 2 (two) times daily with a meal. (Patient taking differently: Take 325 mg by mouth daily.), Disp: 60 tablet, Rfl: 0   folic acid (FOLVITE) 1 MG tablet, Take 1 tablet (1 mg total) by mouth daily., Disp: 30 tablet, Rfl: 0   ketoconazole (NIZORAL) 2 % cream, Apply 1 application topically daily. To dry scaly skin on feet, Disp: 60 g, Rfl: 2   loratadine (CLARITIN) 10 MG tablet, Take 1 tablet (10 mg total) by mouth daily., Disp: 30 tablet, Rfl: 0  magnesium oxide (MAG-OX) 400 (241.3 Mg) MG tablet, Take 1 tablet by mouth daily., Disp: , Rfl:    Magnesium Oxide 400 MG CAPS, Take 1 capsule (400 mg total) by mouth daily., Disp: , Rfl:    Multiple Vitamins-Minerals (CENTRUM SILVER 50+MEN) TABS, Take 1 tablet by mouth daily with breakfast., Disp: , Rfl:    nitroGLYCERIN (NITROSTAT) 0.4 MG SL tablet, Place 1 tablet (0.4 mg total) under the tongue every 5 (five) minutes x 3 doses as needed for chest pain., Disp: 30 tablet, Rfl: 12   pantoprazole (PROTONIX) 40 MG tablet, Take 1 tablet (40 mg total) by mouth daily., Disp: 30 tablet, Rfl: 0   potassium chloride SA (KLOR-CON) 20 MEQ tablet, Take 1 tablet (20 mEq total) by mouth daily., Disp: 90 tablet, Rfl: 3   predniSONE (DELTASONE) 20 MG tablet, Take 1-2 tablets (20-40 mg total) by mouth daily with breakfast. Take 2 tablets (40mg ) daily x 2 weeks, then 1 tablet (20mg ) daily.,  Disp: 60 tablet, Rfl: 1   tamsulosin (FLOMAX) 0.4 MG CAPS capsule, Take 1 capsule (0.4 mg total) by mouth every evening., Disp: 30 capsule, Rfl: 0   torsemide (DEMADEX) 20 MG tablet, Take 60 mg by mouth See admin instructions. TAKE 3 TABLETS BY MOUTH EVERY MORNING, MAY TAKE A SECOND DOSE OF 3 TABLETS IN THE AFTERNOON AS NEEDED FOR SWELLING, Disp: , Rfl:    Torsemide 60 MG TABS, take 3 tables, Disp: , Rfl:    traZODone (DESYREL) 50 MG tablet, Take 1 tablet (50 mg total) by mouth at bedtime., Disp: 30 tablet, Rfl: 0 No current facility-administered medications for this visit.  Facility-Administered Medications Ordered in Other Visits:    metoprolol tartrate (LOPRESSOR) injection, , , Anesthesia Intra-op, Mariea Clonts, CRNA, 5 mg at 02/27/19 1318  Social History   Tobacco Use  Smoking Status Never  Smokeless Tobacco Never  Tobacco Comments      quit 20 years  smoked on and off for 20 years    Allergies  Allergen Reactions   Penicillins Other (See Comments)    Tolerated Cephalosporin Date: 10/15/19. Allergy is from childhood Did it involve swelling of the face/tongue/throat, SOB, or low BP? Unknown Did it involve sudden or severe rash/hives, skin peeling, or any reaction on the inside of your mouth or nose? Unknown Did you need to seek medical attention at a hospital or doctor's office? Unknown When did it last happen?    childhood reaction   If all above answers are "NO", may proceed with cephalosporin use.   Penicillins    Objective:  There were no vitals filed for this visit. There is no height or weight on file to calculate BMI. Constitutional Well developed. Well nourished.  Vascular Dorsalis pedis pulses palpable bilaterally. Posterior tibial pulses palpable bilaterally. Capillary refill normal to all digits.  No cyanosis or clubbing noted. Pedal hair growth normal.  Neurologic Normal speech. Oriented to person, place, and time. Protective sensation absent   Dermatologic Wound Location: Left hallux ulceration with fat layer exposed.  Granular wound bed noted.  No malodor present no purulent drainage noted.  No probing down to bone noted. Wound Base: Granular/Healthy Peri-wound: Normal Exudate: Scant/small amount Serous exudate Wound Measurements: -See below  Left dorsal midfoot wound probing down to deep tissue.  Does not involve bone.  No purulent drainage was expressed.  No clinical signs of infection noted. Wound Base: Mixed Granular/Fibrotic Peri-wound: Normal Exudate: Scant/small amount Serosanguinous exudate Wound Measurements: -See below  Orthopedic: No pain  to palpation either foot.   Radiographs: None Assessment:   1. Diabetic ulcer of toe of right foot associated with type 2 diabetes mellitus, with fat layer exposed ([place])   2. Diabetic ulcer of toe of left foot associated with type 2 diabetes mellitus, with fat layer exposed ([place])      Plan:  Patient was evaluated and treated and all questions answered.  Ulcer left dorsal midfoot wound and left hallux wound -Debridement as below. -Dressed with Betadine wet-to-dry, DSD. -Continue off-loading with surgical shoe. -Wound continue doing graft application and to resolve meant of the wound.  Application of synthetic skin graft/substitute x2 Name: Organogenesis PuraPly XT lot [id number] expiration date 2021-08-31 Usage: 4.91 cm x 4.9 cm graft x 2 was applied directly to the listed above wound site and secured with Adaptic, kerlix, Ace bandage. Waste: No graft material was wasted and was applied in its entirety.   Procedure: Excisional Debridement of Wound left hallux ulcer fat layer exposed Tool: Sharp chisel blade/tissue nipper Rationale: Removal of non-viable soft tissue from the wound to promote healing.  Anesthesia: none Pre-Debridement Wound Measurements: 2.2 centimeters by 1.9 cm x 0.3 cm Post-Debridement Wound Measurements: 2.2 centimeters by 1.9 cm x 0.3  cm Type of Debridement: Sharp Excisional Tissue Removed: Non-viable soft tissue Blood loss: Minimal (<50cc) Depth of Debridement: subcutaneous tissue. Technique: Sharp excisional debridement to bleeding, viable wound base.  Wound Progress: The wound is decreasing with graft application Dressing: Dry, sterile, compression dressing. Disposition: Patient tolerated procedure well. Patient to return in 1 week for follow-up.  Procedure: Excisional Debridement of Wound left dorsal midfoot wound with tunneling Tool: Sharp chisel blade/tissue nipper Rationale: Removal of non-viable soft tissue from the wound to promote healing.  Anesthesia: none Pre-Debridement Wound Measurements: 0.7 cm x 1.5 cm x 0.5 cm Post-Debridement Wound Measurements: 0.7 cm x 1.5 cm x 0.5 cm Type of Debridement: Sharp Excisional Tissue Removed: Non-viable soft tissue Blood loss: Minimal (<50cc) Depth of Debridement: subcutaneous tissue. Technique: Sharp excisional debridement to bleeding, viable wound base.  Wound Progress: The wound is decreasing with graft application Dressing: Dry, sterile, compression dressing. Disposition: Patient tolerated procedure well. Patient to return in 1 week for follow-up.  No follow-ups on file.

## 2020-11-10 NOTE — Telephone Encounter (Signed)
Patient is calling to cancel upcoming appointment, has Covid, will call back when better.

## 2020-11-17 ENCOUNTER — Other Ambulatory Visit: Payer: Self-pay | Admitting: *Deleted

## 2020-11-17 NOTE — Patient Outreach (Signed)
[facility]) Care Management  11/17/2020  Darryl Diaz May 29, 1940 [id number]   [facility] second Unsuccessful outreach for post hospital/snf/complex care referral   Referral Date: 10/22/20 Referral Source: [facility] hospital liaison Reason for Referral: Disease management services needed: Nurse Case Manager    Diagnoses of Heart Failure      Insurance: Medicare/AARP   Previous [facility] pt in November 2021 [facility] admissions x 2 in last 6 months Last admission 10/13/20- 10/17/20 cellulitis of great toe of left foot 5/2-8/22 AKI, microhematuria, TIA, CHF/edema  Last successful outreach on 10/23/20   Outreach attempt to the home number No answer. Unable to leave a HIPAA compliant voice message      Social: 80 yr old male lives alone but with support of his son, Quillian Quince and sister [name] His son stayed with him after his discharge Independent with care needs He moved to [place] 6 years ago Denies social determinants of health (SDOH) concerns History of skilled nursing facility (snf) stay at [facility] in October 2021 after GI bleed   Plan: [facility] RN CM scheduled this patient for another call attempt within 30 business days Unsuccessful outreaches on 11/06/20 & 11/17/20  Unsuccessful outreach letter sent 11/17/20   Joelene Millin L. Lavina Hamman, RN, BSN, [place] Coordinator Office number [phone] Mobile number [phone] Main [facility] number [phone] Fax number [phone]

## 2020-11-18 ENCOUNTER — Ambulatory Visit (INDEPENDENT_AMBULATORY_CARE_PROVIDER_SITE_OTHER): Payer: Medicare Other | Admitting: Podiatry

## 2020-11-18 ENCOUNTER — Other Ambulatory Visit: Payer: Self-pay

## 2020-11-18 ENCOUNTER — Ambulatory Visit: Payer: Medicare Other | Admitting: Podiatry

## 2020-11-18 DIAGNOSIS — E11621 Type 2 diabetes mellitus with foot ulcer: Secondary | ICD-10-CM

## 2020-11-18 DIAGNOSIS — L97512 Non-pressure chronic ulcer of other part of right foot with fat layer exposed: Secondary | ICD-10-CM | POA: Diagnosis not present

## 2020-11-18 DIAGNOSIS — L97522 Non-pressure chronic ulcer of other part of left foot with fat layer exposed: Secondary | ICD-10-CM | POA: Diagnosis not present

## 2020-11-18 DIAGNOSIS — I999 Unspecified disorder of circulatory system: Secondary | ICD-10-CM | POA: Diagnosis not present

## 2020-11-23 DIAGNOSIS — I951 Orthostatic hypotension: Secondary | ICD-10-CM | POA: Insufficient documentation

## 2020-11-24 ENCOUNTER — Encounter: Payer: Self-pay | Admitting: Podiatry

## 2020-11-24 NOTE — Progress Notes (Signed)
Subjective:  Patient ID: Darryl Diaz, male    DOB: [birth year],  MRN: [id number]  Chief Complaint  Patient presents with   Wound Check    Right foot wound check     80 y.o. male presents for wound care.  Patient presents with complaint of left hallux ulceration as well as left dorsal midfoot wound.  Patient is here for graft application.  He denies any other acute complaints.  We will continue to apply graft and to resolve meant.   Review of Systems: Negative except as noted in the HPI. Denies N/V/F/Ch.  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   Anemia    Ascending aorta dilation (HCC)    Bradycardia    low 20's   Bradycardic cardiac arrest [name])    Cardiac arrest ([place]) 04/20/2019   12 min CPR with epi   Chronic diastolic CHF (congestive heart failure) ([place])    Fall at home 03/28/2016   Fatty liver    Gastritis    GERD (gastroesophageal reflux disease)    H/O seasonal allergies    History of GI bleed    Hx of adenomatous colonic polyps 08/10/2017   Hyperlipidemia    Hypertension    Hypotension    Insomnia    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   Mallory-Weiss tear    OA (osteoarthritis)    hands   OSA (obstructive sleep apnea)    No longer uses CPAP   Persistent atrial fibrillation with rapid ventricular response ([place]) 02/05/2020   Recurrent syncope    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(HCC)     Current Outpatient Medications:    acetaminophen (TYLENOL) 325 MG tablet, Take 2 tablets (650 mg total) by mouth every 4 (four) hours as needed for headache or mild pain., Disp: , Rfl:    amiodarone (PACERONE) 200 MG tablet, Take 1 tablet (200 mg total) by mouth daily., Disp: 90 tablet, Rfl: 3   amLODipine (NORVASC) 5 MG tablet, Take 1 tablet (5 mg total) by mouth daily., Disp: 30 tablet, Rfl: 0   apixaban (ELIQUIS) 5 MG TABS tablet, Take 1 tablet (5 mg total) by mouth 2 (two) times daily., Disp: , Rfl:     atorvastatin (LIPITOR) 20 MG tablet, Take 1 tablet (20 mg total) by mouth daily., Disp: 30 tablet, Rfl: 0   collagenase (SANTYL) ointment, Apply 1 application topically daily., Disp: 15 g, Rfl: 0   dapsone 100 MG tablet, Take 100 mg by mouth daily., Disp: , Rfl:    dapsone 25 MG tablet, , Disp: , Rfl:    doxycycline (VIBRA-TABS) 100 MG tablet, TAKE 1 TABLET BY MOUTH TWICE A DAY FOR 14 DAYS, Disp: , Rfl:    escitalopram (LEXAPRO) 10 MG tablet, Take 1 tablet (10 mg total) by mouth daily., Disp: 30 tablet, Rfl: 0   ferrous sulfate 325 (65 FE) MG tablet, Take 1 tablet (325 mg total) by mouth 2 (two) times daily with a meal. (Patient taking differently: Take 325 mg by mouth daily.), Disp: 60 tablet, Rfl: 0   folic acid (FOLVITE) 1 MG tablet, Take 1 tablet (1 mg total) by mouth daily., Disp: 30 tablet, Rfl: 0   ketoconazole (NIZORAL) 2 % cream, Apply 1 application topically daily. To dry scaly skin on feet, Disp: 60 g, Rfl: 2   loratadine (CLARITIN) 10 MG tablet, Take 1 tablet (10 mg total) by mouth daily., Disp: 30 tablet, Rfl: 0  magnesium oxide (MAG-OX) 400 (241.3 Mg) MG tablet, Take 1 tablet by mouth daily., Disp: , Rfl:    Magnesium Oxide 400 MG CAPS, Take 1 capsule (400 mg total) by mouth daily., Disp: , Rfl:    Multiple Vitamins-Minerals (CENTRUM SILVER 50+MEN) TABS, Take 1 tablet by mouth daily with breakfast., Disp: , Rfl:    nitroGLYCERIN (NITROSTAT) 0.4 MG SL tablet, Place 1 tablet (0.4 mg total) under the tongue every 5 (five) minutes x 3 doses as needed for chest pain., Disp: 30 tablet, Rfl: 12   pantoprazole (PROTONIX) 40 MG tablet, Take 1 tablet (40 mg total) by mouth daily., Disp: 30 tablet, Rfl: 0   potassium chloride SA (KLOR-CON) 20 MEQ tablet, Take 1 tablet (20 mEq total) by mouth daily., Disp: 90 tablet, Rfl: 3   predniSONE (DELTASONE) 20 MG tablet, Take 1-2 tablets (20-40 mg total) by mouth daily with breakfast. Take 2 tablets (40mg ) daily x 2 weeks, then 1 tablet (20mg ) daily.,  Disp: 60 tablet, Rfl: 1   tamsulosin (FLOMAX) 0.4 MG CAPS capsule, Take 1 capsule (0.4 mg total) by mouth every evening., Disp: 30 capsule, Rfl: 0   torsemide (DEMADEX) 20 MG tablet, Take 60 mg by mouth See admin instructions. TAKE 3 TABLETS BY MOUTH EVERY MORNING, MAY TAKE A SECOND DOSE OF 3 TABLETS IN THE AFTERNOON AS NEEDED FOR SWELLING, Disp: , Rfl:    Torsemide 60 MG TABS, take 3 tables, Disp: , Rfl:    traZODone (DESYREL) 50 MG tablet, Take 1 tablet (50 mg total) by mouth at bedtime., Disp: 30 tablet, Rfl: 0 No current facility-administered medications for this visit.  Facility-Administered Medications Ordered in Other Visits:    metoprolol tartrate (LOPRESSOR) injection, , , Anesthesia Intra-op, Mariea Clonts, CRNA, 5 mg at 02/27/19 1318  Social History   Tobacco Use  Smoking Status Never  Smokeless Tobacco Never  Tobacco Comments      quit 20 years  smoked on and off for 20 years    Allergies  Allergen Reactions   Penicillins Other (See Comments)    Tolerated Cephalosporin Date: 10/15/19. Allergy is from childhood Did it involve swelling of the face/tongue/throat, SOB, or low BP? Unknown Did it involve sudden or severe rash/hives, skin peeling, or any reaction on the inside of your mouth or nose? Unknown Did you need to seek medical attention at a hospital or doctor's office? Unknown When did it last happen?    childhood reaction   If all above answers are "NO", may proceed with cephalosporin use.   Penicillins    Objective:  There were no vitals filed for this visit. There is no height or weight on file to calculate BMI. Constitutional Well developed. Well nourished.  Vascular Dorsalis pedis pulses palpable bilaterally. Posterior tibial pulses palpable bilaterally. Capillary refill normal to all digits.  No cyanosis or clubbing noted. Pedal hair growth normal.  Neurologic Normal speech. Oriented to person, place, and time. Protective sensation absent   Dermatologic Wound Location: Left hallux ulceration with fat layer exposed.  Granular wound bed noted.  No malodor present no purulent drainage noted.  No probing down to bone noted. Wound Base: Granular/Healthy Peri-wound: Normal Exudate: Scant/small amount Serous exudate Wound Measurements: -See below  Left dorsal midfoot wound probing down to deep tissue.  Does not involve bone.  No purulent drainage was expressed.  No clinical signs of infection noted. Wound Base: Mixed Granular/Fibrotic Peri-wound: Normal Exudate: Scant/small amount Serosanguinous exudate Wound Measurements: -See below  Orthopedic: No pain  to palpation either foot.   Radiographs: None Assessment:   1. Diabetic ulcer of toe of right foot associated with type 2 diabetes mellitus, with fat layer exposed ([place])   2. 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 Vascular abnormality       Plan:  Patient was evaluated and treated and all questions answered.  Ulcer left dorsal midfoot wound and left hallux wound -Debridement as below. -Dressed with Betadine wet-to-dry, DSD. -Continue off-loading with surgical shoe. -ABI reviewed and given the stagnation of the wound, I believe patient will benefit from vascular intervention to improve flow. Patient agrees with the plan -We will resume graft application once the vascular flow has been optimized.    Procedure: Excisional Debridement of Wound left hallux ulcer fat layer exposed Tool: Sharp chisel blade/tissue nipper Rationale: Removal of non-viable soft tissue from the wound to promote healing.  Anesthesia: none Pre-Debridement Wound Measurements: 2.2 centimeters by 1.9 cm x 0.3 cm Post-Debridement Wound Measurements: 2.2 centimeters by 1.9 cm x 0.3 cm Type of Debridement: Sharp Excisional Tissue Removed: Non-viable soft tissue Blood loss: Minimal (<50cc) Depth of Debridement: subcutaneous  tissue. Technique: Sharp excisional debridement to bleeding, viable wound base.  Wound Progress: The wound is decreasing with graft application Dressing: Dry, sterile, compression dressing. Disposition: Patient tolerated procedure well. Patient to return in 1 week for follow-up.  Procedure: Excisional Debridement of Wound left dorsal midfoot wound with tunneling Tool: Sharp chisel blade/tissue nipper Rationale: Removal of non-viable soft tissue from the wound to promote healing.  Anesthesia: none Pre-Debridement Wound Measurements: 0.7 cm x 1.5 cm x 0.5 cm Post-Debridement Wound Measurements: 0.7 cm x 1.5 cm x 0.5 cm Type of Debridement: Sharp Excisional Tissue Removed: Non-viable soft tissue Blood loss: Minimal (<50cc) Depth of Debridement: subcutaneous tissue. Technique: Sharp excisional debridement to bleeding, viable wound base.  Wound Progress: The wound is decreasing with graft application Dressing: Dry, sterile, compression dressing. Disposition: Patient tolerated procedure well. Patient to return in 1 week for follow-up.  No follow-ups on file.

## 2020-11-25 ENCOUNTER — Ambulatory Visit: Payer: Medicare Other | Admitting: Internal Medicine

## 2020-11-25 ENCOUNTER — Other Ambulatory Visit: Payer: Self-pay

## 2020-11-25 ENCOUNTER — Emergency Department (HOSPITAL_COMMUNITY): Payer: Medicare Other

## 2020-11-25 ENCOUNTER — Encounter (HOSPITAL_COMMUNITY): Payer: Self-pay | Admitting: Emergency Medicine

## 2020-11-25 ENCOUNTER — Emergency Department (HOSPITAL_COMMUNITY)
Admission: EM | Admit: 2020-11-25 | Discharge: 2020-11-25 | Disposition: A | Discharge status: Home / Self Care | Payer: Medicare Other | Attending: Emergency Medicine | Admitting: Emergency Medicine

## 2020-11-25 ENCOUNTER — Ambulatory Visit: Payer: Medicare Other | Admitting: Podiatry

## 2020-11-25 DIAGNOSIS — M5431 Sciatica, right side: Secondary | ICD-10-CM

## 2020-11-25 DIAGNOSIS — M549 Dorsalgia, unspecified: Secondary | ICD-10-CM | POA: Diagnosis not present

## 2020-11-25 DIAGNOSIS — M5441 Lumbago with sciatica, right side: Secondary | ICD-10-CM | POA: Diagnosis not present

## 2020-11-25 DIAGNOSIS — I951 Orthostatic hypotension: Secondary | ICD-10-CM

## 2020-11-25 DIAGNOSIS — Z79899 Other long term (current) drug therapy: Secondary | ICD-10-CM | POA: Insufficient documentation

## 2020-11-25 DIAGNOSIS — Z7901 Long term (current) use of anticoagulants: Secondary | ICD-10-CM | POA: Diagnosis not present

## 2020-11-25 DIAGNOSIS — I480 Paroxysmal atrial fibrillation: Secondary | ICD-10-CM

## 2020-11-25 DIAGNOSIS — I2510 Atherosclerotic heart disease of native coronary artery without angina pectoris: Secondary | ICD-10-CM | POA: Diagnosis not present

## 2020-11-25 DIAGNOSIS — Z86018 Personal history of other benign neoplasm: Secondary | ICD-10-CM | POA: Insufficient documentation

## 2020-11-25 DIAGNOSIS — I130 Hypertensive heart and chronic kidney disease with heart failure and stage 1 through stage 4 chronic kidney disease, or unspecified chronic kidney disease: Secondary | ICD-10-CM | POA: Insufficient documentation

## 2020-11-25 DIAGNOSIS — N184 Chronic kidney disease, stage 4 (severe): Secondary | ICD-10-CM | POA: Insufficient documentation

## 2020-11-25 DIAGNOSIS — I10 Essential (primary) hypertension: Secondary | ICD-10-CM | POA: Diagnosis not present

## 2020-11-25 DIAGNOSIS — I5032 Chronic diastolic (congestive) heart failure: Secondary | ICD-10-CM

## 2020-11-25 DIAGNOSIS — M25551 Pain in right hip: Secondary | ICD-10-CM | POA: Diagnosis not present

## 2020-11-25 DIAGNOSIS — M545 Low back pain, unspecified: Secondary | ICD-10-CM | POA: Diagnosis not present

## 2020-11-25 DIAGNOSIS — Z96643 Presence of artificial hip joint, bilateral: Secondary | ICD-10-CM | POA: Diagnosis not present

## 2020-11-25 MED ORDER — PREDNISONE 10 MG PO TABS: 20.0000 mg | ORAL_TABLET | Freq: Every day | ORAL | 0 refills | Status: AC

## 2020-11-25 MED ORDER — HYDROCODONE-ACETAMINOPHEN 5-325 MG PO TABS
1.0000 | ORAL_TABLET | Freq: Four times a day (QID) | ORAL | 0 refills | Status: DC | PRN
Start: 2020-11-25 — End: 2020-12-04

## 2020-11-25 MED ORDER — HYDROCODONE-ACETAMINOPHEN 5-325 MG PO TABS
1.0000 | ORAL_TABLET | Freq: Once | ORAL | Status: AC
  Administered 2020-11-25: 1 via ORAL
  Filled 2020-11-25: qty 1

## 2020-11-25 NOTE — ED Provider Notes (Signed)
[facility] EMERGENCY DEPARTMENT Provider Note   CSN: [id number] Arrival date & time: 11/25/20  2119     History Chief Complaint  Patient presents with   Back Pain   Hip Pain    Darryl Diaz is a 80 y.o. male.   Back Pain Hip Pain   Patient presents to the ED with complaints of pain in his right back and hip.  Patient states this has been going on for few weeks now.  He is scheduled to see his orthopedic doctor.  The pain is in his buttock region in his lower back and goes down towards his right hip.  It is a sharp and aching pain that increases with movement.  He denies having any numbness or weakness.  No trouble urinating.  No abdominal pain.  This morning however he was having more pain and it was difficult for him to walk.  He had to call EMS.  He did not take anything for pain this morning.  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 following wife's death   Anemia    Ascending aorta dilation (HCC)    Bradycardia    low 20's   Bradycardic cardiac arrest (HCC)    Cardiac arrest ([place]) 04/20/2019   12 min CPR with epi   Chronic diastolic CHF (congestive heart failure) ([place])    Fall at home 03/28/2016   Fatty liver    Gastritis    GERD (gastroesophageal reflux disease)    H/O seasonal allergies    History of GI bleed    Hx of adenomatous colonic polyps 08/10/2017   Hyperlipidemia    Hypertension    Hypotension    Insomnia    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   Mallory-Weiss tear    OA (osteoarthritis)    hands   OSA (obstructive sleep apnea)    No longer uses CPAP   Persistent atrial fibrillation with rapid ventricular response ([place]) 02/05/2020   Recurrent syncope    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[facility])     Patient Active Problem List   Diagnosis Date Noted   Orthostatic hypotension 11/23/2020   Acute renal insufficiency 10/23/2020   Alcohol abuse 10/23/2020   Hypokalemia 10/23/2020    Hypomagnesemia 10/23/2020   Low back pain at multiple sites 10/23/2020   Malnutrition of mild degree Altamease [place]iler: 75% to less than 90% of standard weight) ([place]) 10/23/2020   Open wound of scalp without complication 41/74/0814   Oral candidiasis 10/23/2020   Cellulitis of left foot    Cellulitis of toe of left foot 10/13/2020   Skin ulcer of left great toe ([place]) 10/13/2020   CKD (chronic kidney disease) stage 4, 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/13/2020   Chronic heart failure with preserved ejection fraction (HFpEF) ([place]) 10/13/2020   Transient weakness of left lower extremity 09/07/2020   Heme positive stool 05/14/2020   Abnormality of gait 04/27/2020   Salmonella food poisoning 48/18/5631   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   Yeast infection 03/16/2020   Diarrhea 03/16/2020   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 (HCC)    Allergies    Anemia, chronic disease    History of hypertension    Debility    Syncope and collapse 02/22/2020   AKI (acute kidney injury) ([place]) 02/10/2020   Unspecified protein-calorie malnutrition ([place]) 02/10/2020   Weakness 02/06/2020   History of repair of hip joint 10/15/2019   Right hip  OA 08/19/2019   Pain in right foot 06/27/2019   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   Bradycardia 04/20/2019   Chronic diastolic heart failure ([place]) 03/14/2019   Atrial fibrillation with rapid ventricular response ([place])    Gastrointestinal hemorrhage 02/25/2019   Fall at home, initial encounter 02/25/2019   Bilateral lower extremity edema 02/25/2019   Hyponatremia 02/25/2019   Fall 02/25/2019   Benign prostatic hyperplasia with lower urinary tract symptoms 12/19/2018   Iron deficiency anemia due to chronic blood loss 08/11/2018   Anemia 08/11/2018   Hardening of the aorta (main artery of the heart) ([place]) 08/11/2018   Benign neoplasm of colon 06/18/2018   History of adenomatous polyp of colon 08/10/2017   PAF (paroxysmal  atrial fibrillation) ([place]) 02/05/2017   Impaired fasting glucose 06/10/2016   Encounter for general adult medical examination without abnormal findings 06/03/2016   Posterior dislocation of hip, closed ([place]) 04/30/2016   Hip fracture ([place]) 03/30/2016   Hypertension 03/30/2016   Hyperlipidemia 03/30/2016   Osteoarthritis 03/30/2016   Depressive disorder 03/30/2016   Rhabdomyolysis 03/30/2016   Leukocytosis 03/30/2016   Pressure ulcer 03/30/2016   Fracture of hip ([place]) 03/30/2016   Gastroesophageal reflux disease 12/17/2015   Paroxysmal atrial fibrillation ([place]) 12/17/2015   Hypertensive disorder 12/17/2015   Long term (current) use of anticoagulants 12/17/2015   Moderate major depression, single episode ([place]) 12/17/2015   Pure hypercholesterolemia 12/17/2015   Seasonal allergic rhinitis 12/17/2015   CAD (coronary artery disease) 08/21/2015   New onset atrial fibrillation ([place]) 11/04/2014    Past Surgical History:  Procedure Laterality Date   ANKLE SURGERY Left 2014   had rods put in    BIOPSY  02/28/2019   Procedure: BIOPSY;  Surgeon: Milus Banister, MD;  Location: [place];  Service: Endoscopy;;   CARDIOVERSION  02/06/2020   CARDIOVERSION N/A 02/06/2020   Procedure: CARDIOVERSION;  Surgeon: Werner Lean, MD;  Location: [facility] ENDOSCOPY;  Service: Cardiovascular;  Laterality: N/A;   CARDIOVERSION N/A 02/24/2020   Procedure: CARDIOVERSION;  Surgeon: Werner Lean, MD;  Location: [facility] ENDOSCOPY;  Service: Cardiovascular;  Laterality: N/A;   COLONOSCOPY     [place]   ESOPHAGOGASTRODUODENOSCOPY (EGD) WITH PROPOFOL N/A 02/28/2019   Procedure: ESOPHAGOGASTRODUODENOSCOPY (EGD) WITH PROPOFOL;  Surgeon: Milus Banister, MD;  Location[facility] END[facility]COPY;  Service: Endoscopy;  Laterality: N/A;   HIP ARTHROPLASTY Left 04/01/2016   Procedure: ARTHROPLASTY BIPOLAR HIP (HEMIARTHROPLASTY);  Surgeon: Paralee Cancel, MD;  Location: [place];  Service: Orthopedics;  Laterality:  Left;   HIP CLOSED REDUCTION Left 04/30/2016   Procedure: CLOSED REDUCTION HIP;  Surgeon: Wylene Simmer, MD;  Location: [place];  Service: Orthopedics;  Laterality: Left;   TONSILLECTOMY     TOTAL HIP ARTHROPLASTY Right 10/15/2019   Procedure: TOTAL HIP ARTHROPLASTY ANTERIOR APPROACH;  Surgeon: Paralee Cancel, MD;  Location: [facility];  Service: Orthopedics;  Laterality: Right;  70 mins       Family History  Problem Relation Age of Onset   Heart disease Mother 78   Hypertension Mother    Arthritis Mother    Heart failure Father 15   Stroke Maternal Aunt    Heart failure Sister    Colon cancer Neg Hx    Esophageal cancer Neg Hx    Stomach cancer Neg Hx    Rectal cancer Neg Hx     Social History   Tobacco Use   Smoking status: Never   Smokeless tobacco: Never   Tobacco comments:  quit 20 years  smoked on and off for 20 years  Vaping Use   Vaping Use: Never used  Substance Use Topics   Alcohol use: Not Currently   Drug use: Never    Home Medications Prior to Admission medications   Medication Sig Start Date End Date Taking? Authorizing Provider  HYDROcodone-acetaminophen (NORCO/VICODIN) 5-325 MG tablet Take 1 tablet by mouth every 6 (six) hours as needed. 11/25/20  Yes Dorie Rank, MD  predniSONE (DELTASONE) 10 MG tablet Take 2 tablets (20 mg total) by mouth daily for 5 days. 11/25/20 11/30/20 Yes Dorie Rank, MD  acetaminophen (TYLENOL) 325 MG tablet Take 2 tablets (650 mg total) by mouth every 4 (four) hours as needed for headache or mild pain. 03/10/20   Angiulli, Lavon Paganini, PA-C  amiodarone (PACERONE) 200 MG tablet Take 1 tablet (200 mg total) by mouth daily. 04/10/20   Dunn, Nedra Hai, PA-C  amLODipine (NORVASC) 5 MG tablet Take 1 tablet (5 mg total) by mouth daily. 09/14/20   Caren Griffins, MD  apixaban (ELIQUIS) 5 MG TABS tablet Take 1 tablet (5 mg total) by mouth 2 (two) times daily. 10/17/20   Eugenie Filler, MD  atorvastatin (LIPITOR) 20 MG tablet Take 1 tablet (20 mg  total) by mouth daily. 03/10/20   Angiulli, Lavon Paganini, PA-C  collagenase (SANTYL) ointment Apply 1 application topically daily. 10/26/20   Felipa Furnace, DPM  dapsone 100 MG tablet Take 100 mg by mouth daily. 09/29/20   [provider]  dapsone 25 MG tablet  09/29/20   [provider]  doxycycline (VIBRA-TABS) 100 MG tablet TAKE 1 TABLET BY MOUTH TWICE A DAY FOR 14 DAYS 10/19/20   [provider]  escitalopram (LEXAPRO) 10 MG tablet Take 1 tablet (10 mg total) by mouth daily. 03/10/20   Angiulli, Lavon Paganini, PA-C  ferrous sulfate 325 (65 FE) MG tablet Take 1 tablet (325 mg total) by mouth 2 (two) times daily with a meal. Patient taking differently: Take 325 mg by mouth daily. 03/10/20 04/10/20  Angiulli, Lavon Paganini, PA-C  folic acid (FOLVITE) 1 MG tablet Take 1 tablet (1 mg total) by mouth daily. 03/10/20   Angiulli, Lavon Paganini, PA-C  ketoconazole (NIZORAL) 2 % cream Apply 1 application topically daily. To dry scaly skin on feet 07/23/20   McDonald, Adam R, DPM  loratadine (CLARITIN) 10 MG tablet Take 1 tablet (10 mg total) by mouth daily. 02/12/20   Medina-Vargas, Monina C, NP  magnesium oxide (MAG-OX) 400 (241.3 Mg) MG tablet Take 1 tablet by mouth daily. 05/28/20   [provider]  Magnesium Oxide 400 MG CAPS Take 1 capsule (400 mg total) by mouth daily. 10/17/20   Eugenie Filler, MD  Multiple Vitamins-Minerals (CENTRUM SILVER 50+MEN) TABS Take 1 tablet by mouth daily with breakfast.    [provider]  nitroGLYCERIN (NITROSTAT) 0.4 MG SL tablet Place 1 tablet (0.4 mg total) under the tongue every 5 (five) minutes x 3 doses as needed for chest pain. 07/28/20   Baldwin Jamaica, PA-C  pantoprazole (PROTONIX) 40 MG tablet Take 1 tablet (40 mg total) by mouth daily. 03/10/20   Angiulli, Lavon Paganini, PA-C  potassium chloride SA (KLOR-CON) 20 MEQ tablet Take 1 tablet (20 mEq total) by mouth daily. 10/17/20 10/12/21  Eugenie Filler, MD  tamsulosin (FLOMAX) 0.4 MG CAPS capsule  Take 1 capsule (0.4 mg total) by mouth every evening. 03/10/20   Angiulli, Lavon Paganini, PA-C  torsemide (DEMADEX) 20 MG  tablet Take 60 mg by mouth See admin instructions. TAKE 3 TABLETS BY MOUTH EVERY MORNING, MAY TAKE A SECOND DOSE OF 3 TABLETS IN THE AFTERNOON AS NEEDED FOR SWELLING    [provider]  Torsemide 60 MG TABS take 3 tables    [provider]  traZODone (DESYREL) 50 MG tablet Take 1 tablet (50 mg total) by mouth at bedtime. 03/10/20   Angiulli, Lavon Paganini, PA-C    Allergies    Penicillins and Penicillins  Review of Systems   Review of Systems  Musculoskeletal:  Positive for back pain.  All other systems reviewed and are negative.  Physical Exam Updated Vital Signs BP (!) 163/71   Pulse 73   Temp 97.9 F (36.6 C) (Oral)   Resp 20   SpO2 99%   Physical Exam Vitals and nursing note reviewed.  Constitutional:      General: He is not in acute distress.    Appearance: He is well-developed.  HENT:     Head: Normocephalic and atraumatic.     Right Ear: External ear normal.     Left Ear: External ear normal.  Eyes:     General: No scleral icterus.       Right eye: No discharge.        Left eye: No discharge.     Conjunctiva/sclera: Conjunctivae normal.  Neck:     Trachea: No tracheal deviation.  Cardiovascular:     Rate and Rhythm: Normal rate and regular rhythm.  Pulmonary:     Effort: Pulmonary effort is normal. No respiratory distress.     Breath sounds: Normal breath sounds. No stridor. No wheezing or rales.  Abdominal:     General: Bowel sounds are normal. There is no distension.     Palpations: Abdomen is soft.     Tenderness: There is no abdominal tenderness. There is no guarding or rebound.  Musculoskeletal:        General: No swelling, tenderness or deformity.     Cervical back: Neck supple.     Lumbar back: No swelling, deformity or tenderness.     Comments: Bilateral lower extremities are warm and well-perfused, no cyanosis erythema or  edema  Skin:    General: Skin is warm and dry.     Findings: No rash.  Neurological:     General: No focal deficit present.     Mental Status: He is alert.     Cranial Nerves: No cranial nerve deficit (no facial droop, extraocular movements intact, no slurred speech).     Sensory: No sensory deficit.     Motor: No abnormal muscle tone or seizure activity.     Coordination: Coordination normal.  Psychiatric:        Mood and Affect: Mood normal.    ED Results / Procedures / Treatments   Labs (all labs ordered are listed, but only abnormal results are displayed) Labs Reviewed - No data to display  EKG None  Radiology DG Lumbar Spine Complete  Result Date: 11/25/2020 CLINICAL DATA:  Acute lower back pain. EXAM: LUMBAR SPINE - COMPLETE 4+ VIEW COMPARISON:  None. FINDINGS: Minimal grade 1 retrolisthesis is noted at L2-3 secondary to degenerative disc disease at this level. Severe degenerative disc disease is also noted at L1-2, L3-4, L4-5 and L5-S1. No fracture is noted. IMPRESSION: Severe multilevel degenerative disc disease. No acute abnormality seen. Aortic Atherosclerosis (ICD10-I70.0). Electronically Signed   By: Marijo Conception M.D.   On: 11/25/2020 08:18   DG  HIP UNILAT WITH PELVIS 2-3 VIEWS RIGHT  Result Date: 11/25/2020 CLINICAL DATA:  Acute right hip pain. EXAM: DG HIP (WITH OR WITHOUT PELVIS) 2-3V RIGHT COMPARISON:  None. FINDINGS: Status post right total hip arthroplasty. No fracture or dislocation is noted. IMPRESSION: No acute abnormality seen. Electronically Signed   By: Marijo Conception M.D.   On: 11/25/2020 08:20    Procedures Procedures   Medications Ordered in ED Medications  HYDROcodone-acetaminophen (NORCO/VICODIN) 5-325 MG per tablet 1 tablet (1 tablet Oral Given 11/25/20 0731)    ED Course  I have reviewed the triage vital signs and the nursing notes.  Pertinent labs & imaging results that were available during my care of the patient were reviewed by me and  considered in my medical decision making (see chart for details).  Clinical Course as of 11/25/20 1008  Wed Nov 25, 2020  0926 No acute findings noted on hip x-ray. [JK]  0926 L-spine does show signs of degenerative disc disease [JK]    Clinical Course User Index [JK] Dorie Rank, MD   MDM Rules/Calculators/A&P                           Patient presents to the ED with complaints of back pain.  Presentation consistent with sciatica.  Patient does have radicular component.  No acute neurodeficits noted on exam.  No signs of vascular compromise.  X-rays do not show evidence of acute abnormality.  Suspect his symptoms are related to degenerative disc disease.  Will discharge home with medications for pain.  Outpatient follow-up with neurosurgery Final Clinical Impression(s) / ED Diagnoses Final diagnoses:  Sciatica of right side    Rx / DC Orders ED Discharge Orders          Ordered    predniSONE (DELTASONE) 10 MG tablet  Daily        11/25/20 1006    HYDROcodone-acetaminophen (NORCO/VICODIN) 5-325 MG tablet  Every 6 hours PRN        11/25/20 1006             Dorie Rank, MD 11/25/20 1009

## 2020-11-25 NOTE — ED Triage Notes (Signed)
Patient from home, complaining of lower back pain and right hip pain.  No fall, no injury at this time.  Patient having a hard time walking on the right leg.  Patient is CAOx4.

## 2020-11-25 NOTE — Discharge Instructions (Addendum)
Take the medications as needed for pain.  Follow-up with the spine doctor for further evaluation

## 2020-11-30 ENCOUNTER — Other Ambulatory Visit: Payer: Self-pay | Admitting: *Deleted

## 2020-11-30 ENCOUNTER — Other Ambulatory Visit: Payer: Self-pay

## 2020-11-30 ENCOUNTER — Other Ambulatory Visit: Payer: Self-pay | Admitting: Podiatry

## 2020-11-30 DIAGNOSIS — E11621 Type 2 diabetes mellitus with foot ulcer: Secondary | ICD-10-CM

## 2020-11-30 DIAGNOSIS — N058 Unspecified nephritic syndrome with other morphologic changes: Secondary | ICD-10-CM | POA: Insufficient documentation

## 2020-11-30 DIAGNOSIS — L97512 Non-pressure chronic ulcer of other part of right foot with fat layer exposed: Secondary | ICD-10-CM

## 2020-11-30 DIAGNOSIS — N057 Unspecified nephritic syndrome with diffuse crescentic glomerulonephritis: Secondary | ICD-10-CM | POA: Insufficient documentation

## 2020-11-30 NOTE — Patient Outreach (Signed)
[facility]) Care Management  11/30/2020  Darryl Diaz Aug 06, 1940 [id number]   [facility] outreach with case closure - facility care management services active  Referral Date: 10/22/20 Referral Source: [facility] hospital liaison Reason for Referral: Disease management services needed: Nurse Case Manager    Diagnoses of Heart Failure      Insurance: Medicare/AARP   Previous [facility] pt in November 2021 closed as pt eligible but did not prefer to participate in program  [facility] admissions x 2 in last 6 months Last admission  11/25/20 ED visit for sciatica pain 10/13/20- 10/17/20 cellulitis of great toe of left foot 5/2-8/22 AKI, microhematuria, TIA, CHF/edema   Last successful outreach on 10/23/20 Unsuccessful outreaches on 11/06/20 & 11/17/20 Unsuccessful outreach letter sent 11/17/20   Outreach attempt to the home number [phone]  [name] answered after a delayed period of time Patient is able to verify HIPAA ([place] and Accountability Act) identifiers Reviewed and addressed the purpose of the follow up call with the patient  He updated RN CM that he is now residing in [facility] He reports moving in about 3 weeks ago. He reports having care management services provided via the facility. He reports doing well and meeting new friends [facility] RN CM discussed [place] case closure and he voiced appreciation of services rendered plus preferring no further needed outreach from [place] case closure Letters sent to pt/pcp  Warren L. Lavina Hamman, RN, BSN, [place] Coordinator Office number [phone] [facility] number [phone] Fax number [phone]

## 2020-12-02 ENCOUNTER — Other Ambulatory Visit: Payer: Self-pay

## 2020-12-02 ENCOUNTER — Encounter (HOSPITAL_COMMUNITY): Payer: Self-pay

## 2020-12-02 ENCOUNTER — Observation Stay (HOSPITAL_COMMUNITY)
Admission: EM | Admit: 2020-12-02 | Discharge: 2020-12-04 | Disposition: A | Discharge status: Home / Self Care | Payer: Medicare Other | Attending: Internal Medicine | Admitting: Internal Medicine

## 2020-12-02 DIAGNOSIS — Z7901 Long term (current) use of anticoagulants: Secondary | ICD-10-CM | POA: Diagnosis not present

## 2020-12-02 DIAGNOSIS — D649 Anemia, unspecified: Secondary | ICD-10-CM | POA: Diagnosis not present

## 2020-12-02 DIAGNOSIS — M5441 Lumbago with sciatica, right side: Secondary | ICD-10-CM | POA: Diagnosis present

## 2020-12-02 DIAGNOSIS — I5032 Chronic diastolic (congestive) heart failure: Secondary | ICD-10-CM | POA: Diagnosis present

## 2020-12-02 DIAGNOSIS — D631 Anemia in chronic kidney disease: Secondary | ICD-10-CM | POA: Diagnosis not present

## 2020-12-02 DIAGNOSIS — Z79899 Other long term (current) drug therapy: Secondary | ICD-10-CM | POA: Insufficient documentation

## 2020-12-02 DIAGNOSIS — Z96641 Presence of right artificial hip joint: Secondary | ICD-10-CM | POA: Insufficient documentation

## 2020-12-02 DIAGNOSIS — Z20822 Contact with and (suspected) exposure to covid-19: Secondary | ICD-10-CM | POA: Insufficient documentation

## 2020-12-02 DIAGNOSIS — R262 Difficulty in walking, not elsewhere classified: Secondary | ICD-10-CM | POA: Diagnosis not present

## 2020-12-02 DIAGNOSIS — N184 Chronic kidney disease, stage 4 (severe): Principal | ICD-10-CM | POA: Insufficient documentation

## 2020-12-02 DIAGNOSIS — R9431 Abnormal electrocardiogram [ECG] [EKG]: Secondary | ICD-10-CM | POA: Diagnosis not present

## 2020-12-02 DIAGNOSIS — I872 Venous insufficiency (chronic) (peripheral): Secondary | ICD-10-CM | POA: Diagnosis not present

## 2020-12-02 DIAGNOSIS — D61818 Other pancytopenia: Secondary | ICD-10-CM | POA: Diagnosis present

## 2020-12-02 DIAGNOSIS — F329 Major depressive disorder, single episode, unspecified: Secondary | ICD-10-CM | POA: Diagnosis present

## 2020-12-02 DIAGNOSIS — M5430 Sciatica, unspecified side: Secondary | ICD-10-CM | POA: Diagnosis not present

## 2020-12-02 DIAGNOSIS — F32A Depression, unspecified: Secondary | ICD-10-CM | POA: Diagnosis present

## 2020-12-02 DIAGNOSIS — I10 Essential (primary) hypertension: Secondary | ICD-10-CM | POA: Diagnosis present

## 2020-12-02 DIAGNOSIS — I130 Hypertensive heart and chronic kidney disease with heart failure and stage 1 through stage 4 chronic kidney disease, or unspecified chronic kidney disease: Secondary | ICD-10-CM | POA: Diagnosis not present

## 2020-12-02 DIAGNOSIS — M545 Low back pain, unspecified: Secondary | ICD-10-CM | POA: Diagnosis not present

## 2020-12-02 DIAGNOSIS — I4891 Unspecified atrial fibrillation: Secondary | ICD-10-CM

## 2020-12-02 DIAGNOSIS — M549 Dorsalgia, unspecified: Secondary | ICD-10-CM | POA: Diagnosis not present

## 2020-12-02 DIAGNOSIS — Z87891 Personal history of nicotine dependence: Secondary | ICD-10-CM | POA: Diagnosis not present

## 2020-12-02 DIAGNOSIS — I480 Paroxysmal atrial fibrillation: Secondary | ICD-10-CM | POA: Diagnosis present

## 2020-12-02 DIAGNOSIS — E785 Hyperlipidemia, unspecified: Secondary | ICD-10-CM | POA: Diagnosis present

## 2020-12-02 LAB — CBC WITH DIFFERENTIAL/PLATELET
Abs Immature Granulocytes: 0.04 10*3/uL (ref 0.00–0.07)
Basophils Absolute: 0 10*3/uL (ref 0.0–0.1)
Basophils Relative: 0 %
Eosinophils Absolute: 0 10*3/uL (ref 0.0–0.5)
Eosinophils Relative: 0 %
HCT: 18.8 % — ABNORMAL LOW (ref 39.0–52.0)
Hemoglobin: 6 g/dL — CL (ref 13.0–17.0)
Immature Granulocytes: 1 %
Lymphocytes Relative: 9 %
Lymphs Abs: 0.7 10*3/uL (ref 0.7–4.0)
MCH: 32.6 pg (ref 26.0–34.0)
MCHC: 31.9 g/dL (ref 30.0–36.0)
MCV: 102.2 fL — ABNORMAL HIGH (ref 80.0–100.0)
Monocytes Absolute: 0.6 10*3/uL (ref 0.1–1.0)
Monocytes Relative: 8 %
Neutro Abs: 5.7 10*3/uL (ref 1.7–7.7)
Neutrophils Relative %: 82 %
Platelets: 271 10*3/uL (ref 150–400)
RBC: 1.84 MIL/uL — ABNORMAL LOW (ref 4.22–5.81)
RDW: 15.4 % (ref 11.5–15.5)
WBC: 7 10*3/uL (ref 4.0–10.5)
nRBC: 0 % (ref 0.0–0.2)

## 2020-12-02 LAB — URINALYSIS, ROUTINE W REFLEX MICROSCOPIC
Bacteria, UA: NONE SEEN
Bilirubin Urine: NEGATIVE
Glucose, UA: NEGATIVE mg/dL
Ketones, ur: NEGATIVE mg/dL
Leukocytes,Ua: NEGATIVE
Nitrite: NEGATIVE
Protein, ur: 100 mg/dL — AB
Specific Gravity, Urine: 1.009 (ref 1.005–1.030)
pH: 5 (ref 5.0–8.0)

## 2020-12-02 LAB — BASIC METABOLIC PANEL
Anion gap: 11 (ref 5–15)
BUN: 38 mg/dL — ABNORMAL HIGH (ref 8–23)
CO2: 23 mmol/L (ref 22–32)
Calcium: 8.6 mg/dL — ABNORMAL LOW (ref 8.9–10.3)
Chloride: 98 mmol/L (ref 98–111)
Creatinine, Ser: 3.44 mg/dL — ABNORMAL HIGH (ref 0.61–1.24)
GFR, Estimated: 17 mL/min — ABNORMAL LOW (ref >60)
Glucose, Bld: 90 mg/dL (ref 70–99)
Potassium: 4.8 mmol/L (ref 3.5–5.1)
Sodium: 132 mmol/L — ABNORMAL LOW (ref 135–145)

## 2020-12-02 LAB — POC OCCULT BLOOD, ED: Fecal Occult Bld: NEGATIVE

## 2020-12-02 LAB — PREPARE RBC (CROSSMATCH)

## 2020-12-02 MED ORDER — PANTOPRAZOLE SODIUM 40 MG PO TBEC
40.0000 mg | DELAYED_RELEASE_TABLET | Freq: Every day | ORAL | Status: DC
  Administered 2020-12-03 – 2020-12-04 (×2): 40 mg via ORAL
  Filled 2020-12-02: qty 1
  Filled 2020-12-02: qty 2

## 2020-12-02 MED ORDER — ACETAMINOPHEN 650 MG RE SUPP: 650.0000 mg | Freq: Four times a day (QID) | RECTAL | Status: DC | PRN

## 2020-12-02 MED ORDER — CALCIUM CARBONATE ANTACID 1250 MG/5ML PO SUSP: 500.0000 mg | Freq: Four times a day (QID) | ORAL | Status: DC | PRN

## 2020-12-02 MED ORDER — LACTATED RINGERS IV SOLN: INTRAVENOUS | Status: DC

## 2020-12-02 MED ORDER — TORSEMIDE 20 MG PO TABS: 60.0000 mg | ORAL_TABLET | ORAL | Status: DC

## 2020-12-02 MED ORDER — ATORVASTATIN CALCIUM 10 MG PO TABS
20.0000 mg | ORAL_TABLET | Freq: Every day | ORAL | Status: DC
  Administered 2020-12-03 – 2020-12-04 (×2): 20 mg via ORAL
  Filled 2020-12-02 (×2): qty 2

## 2020-12-02 MED ORDER — HYDROCODONE-ACETAMINOPHEN 5-325 MG PO TABS
1.0000 | ORAL_TABLET | Freq: Four times a day (QID) | ORAL | Status: DC | PRN
  Administered 2020-12-03 (×2): 1 via ORAL
  Filled 2020-12-02 (×2): qty 1

## 2020-12-02 MED ORDER — SODIUM CHLORIDE 0.9 % IV BOLUS: 1000.0000 mL | Freq: Once | INTRAVENOUS | Status: DC

## 2020-12-02 MED ORDER — DOCUSATE SODIUM 283 MG RE ENEM: 1.0000 | ENEMA | RECTAL | Status: DC | PRN

## 2020-12-02 MED ORDER — ADULT MULTIVITAMIN W/MINERALS CH
1.0000 | ORAL_TABLET | Freq: Every day | ORAL | Status: DC
  Administered 2020-12-03 – 2020-12-04 (×2): 1 via ORAL
  Filled 2020-12-02 (×2): qty 1

## 2020-12-02 MED ORDER — ZOLPIDEM TARTRATE 5 MG PO TABS: 5.0000 mg | ORAL_TABLET | Freq: Every evening | ORAL | Status: DC | PRN

## 2020-12-02 MED ORDER — TORSEMIDE 20 MG PO TABS
60.0000 mg | ORAL_TABLET | Freq: Every day | ORAL | Status: DC
  Administered 2020-12-03 – 2020-12-04 (×2): 60 mg via ORAL
  Filled 2020-12-02 (×2): qty 3

## 2020-12-02 MED ORDER — METHOCARBAMOL 500 MG PO TABS: 500.0000 mg | ORAL_TABLET | Freq: Four times a day (QID) | ORAL | Status: DC | PRN

## 2020-12-02 MED ORDER — HYDRALAZINE HCL 20 MG/ML IJ SOLN
5.0000 mg | INTRAMUSCULAR | Status: DC | PRN
  Administered 2020-12-03: 5 mg via INTRAVENOUS
  Filled 2020-12-02: qty 1

## 2020-12-02 MED ORDER — LORATADINE 10 MG PO TABS
10.0000 mg | ORAL_TABLET | Freq: Every day | ORAL | Status: DC
  Administered 2020-12-03 – 2020-12-04 (×2): 10 mg via ORAL
  Filled 2020-12-02 (×2): qty 1

## 2020-12-02 MED ORDER — CENTRUM SILVER 50+MEN PO TABS: 1.0000 | ORAL_TABLET | Freq: Every day | ORAL | Status: DC

## 2020-12-02 MED ORDER — ESCITALOPRAM OXALATE 10 MG PO TABS
10.0000 mg | ORAL_TABLET | Freq: Every day | ORAL | Status: DC
  Administered 2020-12-03 – 2020-12-04 (×2): 10 mg via ORAL
  Filled 2020-12-02 (×2): qty 1

## 2020-12-02 MED ORDER — TRAZODONE HCL 50 MG PO TABS
50.0000 mg | ORAL_TABLET | Freq: Every day | ORAL | Status: DC
  Administered 2020-12-03: 50 mg via ORAL
  Filled 2020-12-02: qty 1

## 2020-12-02 MED ORDER — AMLODIPINE BESYLATE 5 MG PO TABS
5.0000 mg | ORAL_TABLET | Freq: Every day | ORAL | Status: DC
  Administered 2020-12-03 – 2020-12-04 (×2): 5 mg via ORAL
  Filled 2020-12-02 (×2): qty 1

## 2020-12-02 MED ORDER — CAMPHOR-MENTHOL 0.5-0.5 % EX LOTN: 1.0000 "application " | TOPICAL_LOTION | Freq: Three times a day (TID) | CUTANEOUS | Status: DC | PRN

## 2020-12-02 MED ORDER — SODIUM CHLORIDE 0.9 % IV SOLN
10.0000 mL/h | Freq: Once | INTRAVENOUS | Status: AC
  Administered 2020-12-02: 10 mL/h via INTRAVENOUS

## 2020-12-02 MED ORDER — NEPRO/CARBSTEADY PO LIQD: 237.0000 mL | Freq: Three times a day (TID) | ORAL | Status: DC | PRN

## 2020-12-02 MED ORDER — METHOCARBAMOL 1000 MG/10ML IJ SOLN: 500.0000 mg | Freq: Four times a day (QID) | INTRAVENOUS | Status: DC | PRN

## 2020-12-02 MED ORDER — HYDROXYZINE HCL 25 MG PO TABS: 25.0000 mg | ORAL_TABLET | Freq: Three times a day (TID) | ORAL | Status: DC | PRN

## 2020-12-02 MED ORDER — FERROUS SULFATE 325 (65 FE) MG PO TABS
325.0000 mg | ORAL_TABLET | Freq: Two times a day (BID) | ORAL | Status: DC
  Administered 2020-12-03 – 2020-12-04 (×3): 325 mg via ORAL
  Filled 2020-12-02 (×3): qty 1

## 2020-12-02 MED ORDER — MAGNESIUM OXIDE -MG SUPPLEMENT 400 (240 MG) MG PO TABS
400.0000 mg | ORAL_TABLET | Freq: Every day | ORAL | Status: DC
  Administered 2020-12-03 – 2020-12-04 (×2): 400 mg via ORAL
  Filled 2020-12-02 (×2): qty 1

## 2020-12-02 MED ORDER — SORBITOL 70 % SOLN: 30.0000 mL | Status: DC | PRN

## 2020-12-02 MED ORDER — APIXABAN 2.5 MG PO TABS
2.5000 mg | ORAL_TABLET | Freq: Two times a day (BID) | ORAL | Status: DC
  Administered 2020-12-03 – 2020-12-04 (×4): 2.5 mg via ORAL
  Filled 2020-12-02 (×4): qty 1

## 2020-12-02 MED ORDER — FOLIC ACID 1 MG PO TABS
1.0000 mg | ORAL_TABLET | Freq: Every day | ORAL | Status: DC
  Administered 2020-12-03 – 2020-12-04 (×2): 1 mg via ORAL
  Filled 2020-12-02 (×2): qty 1

## 2020-12-02 MED ORDER — AMIODARONE HCL 200 MG PO TABS
200.0000 mg | ORAL_TABLET | Freq: Every day | ORAL | Status: DC
  Administered 2020-12-03 – 2020-12-04 (×2): 200 mg via ORAL
  Filled 2020-12-02 (×2): qty 1

## 2020-12-02 MED ORDER — ACETAMINOPHEN 325 MG PO TABS: 650.0000 mg | ORAL_TABLET | Freq: Four times a day (QID) | ORAL | Status: DC | PRN

## 2020-12-02 MED ORDER — MORPHINE SULFATE (PF) 4 MG/ML IV SOLN
4.0000 mg | Freq: Once | INTRAVENOUS | Status: AC
  Administered 2020-12-02: 4 mg via INTRAVENOUS
  Filled 2020-12-02: qty 1

## 2020-12-02 NOTE — ED Provider Notes (Addendum)
[facility] EMERGENCY DEPARTMENT Provider Note   CSN: [id number] Arrival date & time: 12/02/20  1017     History Chief Complaint  Patient presents with   Back Pain    Darryl Diaz is a 80 y.o. male.  Patient presenting with right-sided back pain describes a sharp and aching.  He had exacerbation of this pain ongoing for the past 2 weeks.  However he states he had similar pain most of his life.  Describes as rating down his right leg worse when he moves a certain way.  Denies any fevers or cough denies any recent falls or trauma.  Denies any new numbness or weakness or bowel or bladder dysfunction.  He states he was seen here a few weeks ago and given Norco's, however he is anxious about becoming addicted to them and has tried to take them too much.  He last took 1 yesterday.      Past Medical History:  Diagnosis Date   Alcoholism ([place])    in remission following wife's death   Anemia    Ascending aorta dilation (HCC)    Bradycardia    low 20's   Bradycardic cardiac arrest (HCC)    Cardiac arrest ([place]) 04/20/2019   12 min CPR with epi   Chronic diastolic CHF (congestive heart failure) ([place])    Fall at home 03/28/2016   Fatty liver    Gastritis    GERD (gastroesophageal reflux disease)    H/O seasonal allergies    History of GI bleed    Hx of adenomatous colonic polyps 08/10/2017   Hyperlipidemia    Hypertension    Hypotension    Insomnia    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   Mallory-Weiss tear    OA (osteoarthritis)    hands   OSA (obstructive sleep apnea)    No longer uses CPAP   Persistent atrial fibrillation with rapid ventricular response ([place]) 02/05/2020   Recurrent syncope    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(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   Orthostatic hypotension 11/23/2020   Acute renal insufficiency 10/23/2020    Alcohol abuse 10/23/2020   Hypokalemia 10/23/2020   Hypomagnesemia 10/23/2020   Low back pain at multiple sites 10/23/2020   Malnutrition of mild degree Altamease Oiler: 75% to less than 90% of standard weight) ([place]) 10/23/2020   Open wound of scalp without complication 24/01/7352   Oral candidiasis 10/23/2020   Cellulitis of left foot    Cellulitis of toe of left foot 10/13/2020   Skin ulcer of left great toe ([place]) 10/13/2020   CKD (chronic kidney disease) stage 4, 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/13/2020   Chronic heart failure with preserved ejection fraction (HFpEF) ([place]) 10/13/2020   Transient weakness of left lower extremity 09/07/2020   Heme positive stool 05/14/2020   Abnormality of gait 04/27/2020   Salmonella food poisoning 29/92/4268   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   Yeast infection 03/16/2020   Diarrhea 03/16/2020   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 (HCC)    Allergies    Anemia, chronic disease    History of hypertension    Debility    Syncope and collapse 02/22/2020   AKI (acute kidney injury) ([place]) 02/10/2020   Unspecified protein-calorie malnutrition ([place]) 02/10/2020   Weakness 02/06/2020   History of repair of hip joint 10/15/2019   Right hip OA  08/19/2019   Pain in right foot 06/27/2019   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   Bradycardia 04/20/2019   Chronic diastolic heart failure ([place]) 03/14/2019   Atrial fibrillation with rapid ventricular response ([place])    Gastrointestinal hemorrhage 02/25/2019   Fall at home, initial encounter 02/25/2019   Bilateral lower extremity edema 02/25/2019   Hyponatremia 02/25/2019   Fall 02/25/2019   Benign prostatic hyperplasia with lower urinary tract symptoms 12/19/2018   Iron deficiency anemia due to chronic blood loss 08/11/2018   Anemia 08/11/2018   Hardening of the aorta (main artery of the heart) ([place]) 08/11/2018   Benign neoplasm of colon 06/18/2018   History of  adenomatous polyp of colon 08/10/2017   PAF (paroxysmal atrial fibrillation) ([place]) 02/05/2017   Impaired fasting glucose 06/10/2016   Encounter for general adult medical examination without abnormal findings 06/03/2016   Posterior dislocation of hip, closed ([place]) 04/30/2016   Hip fracture ([place]) 03/30/2016   Hypertension 03/30/2016   Hyperlipidemia 03/30/2016   Osteoarthritis 03/30/2016   Depressive disorder 03/30/2016   Rhabdomyolysis 03/30/2016   Leukocytosis 03/30/2016   Pressure ulcer 03/30/2016   Fracture of hip ([place]) 03/30/2016   Gastroesophageal reflux disease 12/17/2015   Paroxysmal atrial fibrillation ([place]) 12/17/2015   Hypertensive disorder 12/17/2015   Long term (current) use of anticoagulants 12/17/2015   Moderate major depression, single episode ([place]) 12/17/2015   Pure hypercholesterolemia 12/17/2015   Seasonal allergic rhinitis 12/17/2015   CAD (coronary artery disease) 08/21/2015   New onset atrial fibrillation ([place]) 11/04/2014    Past Surgical History:  Procedure Laterality Date   ANKLE SURGERY Left 2014   had rods put in    BIOPSY  02/28/2019   Procedure: BIOPSY;  Surgeon: Milus Banister, MD;  Location: [place];  Service: Endoscopy;;   CARDIOVERSION  02/06/2020   CARDIOVERSION N/A 02/06/2020   Procedure: CARDIOVERSION;  Surgeon: Werner Lean, MD;  Location: [facility] ENDOSCOPY;  Service: Cardiovascular;  Laterality: N/A;   CARDIOVERSION N/A 02/24/2020   Procedure: CARDIOVERSION;  Surgeon: Werner Lean, MD;  Location: [facility] ENDOSCOPY;  Service: Cardiovascular;  Laterality: N/A;   COLONOSCOPY     [place]   ESOPHAGOGASTRODUODENOSCOPY (EGD) WITH PROPOFOL N/A 02/28/2019   Procedure: ESOPHAGOGASTRODUODENOSCOPY (EGD) WITH PROPOFOL;  Surgeon: Milus Banister, MD;  Location: [facility] ENDOSCOPY;  Service: Endoscopy;  Laterality: N/A;   HIP ARTHROPLASTY Left 04/01/2016   Procedure: ARTHROPLASTY BIPOLAR HIP (HEMIARTHROPLASTY);  Surgeon: Paralee Cancel, MD;   Location: [place];  Service: Orthopedics;  Laterality: Left;   HIP CLOSED REDUCTION Left 04/30/2016   Procedure: CLOSED REDUCTION HIP;  Surgeon: Wylene Simmer, MD;  Location: [place];  Service: Orthopedics;  Laterality: Left;   TONSILLECTOMY     TOTAL HIP ARTHROPLASTY Right 10/15/2019   Procedure: TOTAL HIP ARTHROPLASTY ANTERIOR APPROACH;  Surgeon: Paralee Cancel, MD;  Location: [facility];  Service: Orthopedics;  Laterality: Right;  70 mins       Family History  Problem Relation Age of Onset   Heart disease Mother 85   Hypertension Mother    Arthritis Mother    Heart failure Father 13   Stroke Maternal Aunt    Heart failure Sister    Colon cancer Neg Hx    Esophageal cancer Neg Hx    Stomach cancer Neg Hx    Rectal cancer Neg Hx     Social History   Tobacco Use   Smoking status: Never   Smokeless tobacco: Never   Tobacco comments:  quit 20 years  smoked on and off for 20 years  Vaping Use   Vaping Use: Never used  Substance Use Topics   Alcohol use: Not Currently   Drug use: Never    Home Medications Prior to Admission medications   Medication Sig Start Date End Date Taking? Authorizing Provider  acetaminophen (TYLENOL) 325 MG tablet Take 2 tablets (650 mg total) by mouth every 4 (four) hours as needed for headache or mild pain. 03/10/20  Yes Angiulli, Lavon Paganini, PA-C  amiodarone (PACERONE) 200 MG tablet Take 1 tablet (200 mg total) by mouth daily. 04/10/20  Yes Dunn, Dayna N, PA-C  amLODipine (NORVASC) 5 MG tablet Take 1 tablet (5 mg total) by mouth daily. 09/14/20  Yes Gherghe, Vella Redhead, MD  apixaban (ELIQUIS) 5 MG TABS tablet Take 1 tablet (5 mg total) by mouth 2 (two) times daily. 10/17/20  Yes Eugenie Filler, MD  atorvastatin (LIPITOR) 20 MG tablet Take 1 tablet (20 mg total) by mouth daily. 03/10/20  Yes Angiulli, Lavon Paganini, PA-C  escitalopram (LEXAPRO) 10 MG tablet Take 1 tablet (10 mg total) by mouth daily. 03/10/20  Yes Angiulli, Lavon Paganini, PA-C  folic acid (FOLVITE) 1 MG  tablet Take 1 tablet (1 mg total) by mouth daily. 03/10/20  Yes Angiulli, Lavon Paganini, PA-C  HYDROcodone-acetaminophen (NORCO/VICODIN) 5-325 MG tablet Take 1 tablet by mouth every 6 (six) hours as needed. 11/25/20  Yes Dorie Rank, MD  ketoconazole (NIZORAL) 2 % cream Apply 1 application topically daily. To dry scaly skin on feet 07/23/20  Yes McDonald, Stephan Minister, DPM  loratadine (CLARITIN) 10 MG tablet Take 1 tablet (10 mg total) by mouth daily. 02/12/20  Yes Medina-Vargas, Monina C, NP  Magnesium Oxide 400 MG CAPS Take 1 capsule (400 mg total) by mouth daily. 10/17/20  Yes Eugenie Filler, MD  Multiple Vitamins-Minerals (CENTRUM SILVER 50+MEN) TABS Take 1 tablet by mouth daily with breakfast.   Yes [provider]  nitroGLYCERIN (NITROSTAT) 0.4 MG SL tablet Place 1 tablet (0.4 mg total) under the tongue every 5 (five) minutes x 3 doses as needed for chest pain. 07/28/20  Yes Baldwin [place], PA-C  pantoprazole (PROTONIX) 40 MG tablet Take 1 tablet (40 mg total) by mouth daily. 03/10/20  Yes Angiulli, Lavon Paganini, PA-C  potassium chloride SA (KLOR-CON) 20 MEQ tablet Take 1 tablet (20 mEq total) by mouth daily. 10/17/20 10/12/21 Yes Eugenie Filler, MD  torsemide (DEMADEX) 20 MG tablet Take 60 mg by mouth See admin instructions. TAKE 3 TABLETS BY MOUTH EVERY MORNING, MAY TAKE A SECOND DOSE OF 3 TABLETS IN THE AFTERNOON AS NEEDED FOR SWELLING   Yes [provider]  traZODone (DESYREL) 50 MG tablet Take 1 tablet (50 mg total) by mouth at bedtime. 03/10/20  Yes Angiulli, Lavon Paganini, PA-C  collagenase (SANTYL) ointment Apply 1 application topically daily. Patient not taking: No sig reported 10/26/20   Felipa Furnace, DPM  doxycycline (VIBRA-TABS) 100 MG tablet TAKE 1 TABLET BY MOUTH TWICE A DAY FOR 14 DAYS Patient not taking: No sig reported 10/19/20   [provider]  ferrous sulfate 325 (65 FE) MG tablet Take 1 tablet (325 mg total) by mouth 2 (two) times daily with a meal. Patient taking  differently: Take 325 mg by mouth daily. 03/10/20 04/10/20  Angiulli, Lavon Paganini, PA-C  LAGEVRIO 200 MG CAPS Take by mouth. Patient not taking: No sig reported 11/12/20   [provider]  tamsulosin (FLOMAX) 0.4 MG CAPS  capsule Take 1 capsule (0.4 mg total) by mouth every evening. Patient not taking: No sig reported 03/10/20   Angiulli, Lavon Paganini, PA-C    Allergies    Penicillin g sodium, Penicillins, and Penicillins  Review of Systems   Review of Systems  Constitutional:  Negative for fever.  HENT:  Negative for ear pain and sore throat.   Eyes:  Negative for pain.  Respiratory:  Negative for cough.   Cardiovascular:  Negative for chest pain.  Gastrointestinal:  Negative for abdominal pain.  Genitourinary:  Negative for flank pain.  Musculoskeletal:  Positive for back pain.  Skin:  Negative for color change and rash.  Neurological:  Negative for syncope.  All other systems reviewed and are negative.  Physical Exam Updated Vital Signs BP (!) 163/74   Pulse 88   Temp 97.6 F (36.4 C) (Oral)   Resp 18   SpO2 100%   Physical Exam Constitutional:      Appearance: He is well-developed.  HENT:     Head: Normocephalic.     Nose: Nose normal.  Eyes:     Extraocular Movements: Extraocular movements intact.  Cardiovascular:     Rate and Rhythm: Normal rate.  Pulmonary:     Effort: Pulmonary effort is normal.  Musculoskeletal:     Comments: No C or T-spine midline step-offs or tenderness noted.  L-spine 3-4 midline and right paraspinal tenderness noted.  Patient has positive straight leg test in the right lower extremity.  He also has generalized weakness of lower extremities he is able to lift them off the bed but it takes significant effort for the patient to do so.  Skin:    Coloration: Skin is not jaundiced.  Neurological:     Mental Status: He is alert. Mental status is at baseline.    ED Results / Procedures / Treatments   Labs (all labs ordered are listed, but  only abnormal results are displayed) Labs Reviewed  CBC WITH DIFFERENTIAL/PLATELET - Abnormal; Notable for the following components:      Result Value   RBC 1.84 (*)    Hemoglobin 6.0 (*)    HCT 18.8 (*)    MCV 102.2 (*)    All other components within normal limits  BASIC METABOLIC PANEL - Abnormal; Notable for the following components:   Sodium 132 (*)    BUN 38 (*)    Creatinine, Ser 3.44 (*)    Calcium 8.6 (*)    GFR, Estimated 17 (*)    All other components within normal limits  URINALYSIS, ROUTINE W REFLEX MICROSCOPIC - Abnormal; Notable for the following components:   Hgb urine dipstick LARGE (*)    Protein, ur 100 (*)    All other components within normal limits  POC OCCULT BLOOD, ED  TYPE AND SCREEN  PREPARE RBC (CROSSMATCH)    EKG EKG Interpretation  Date/Time:  Wednesday December 02 2020 10:23:44 EDT Ventricular Rate:  94 PR Interval:  53 QRS Duration: 113 QT Interval:  410 QTC Calculation: 513 R Axis:   -34 Text Interpretation: Sinus rhythm Short PR interval Probable left atrial enlargement Borderline IVCD with LAD Abnormal R-wave progression, late transition Inferior infarct, old Prolonged QT interval Confirmed by Thamas Jaegers ([id number]) on 12/02/2020 1:34:55 PM  Radiology No results found.  Procedures .Critical Care  Date/Time: 01/01/2021 10:08 PM Performed by: Luna Fuse, MD Authorized by: Luna Fuse, MD   Critical care provider statement:    Critical care time (minutes):  30  Critical care time was exclusive of:  Separately billable procedures and treating other patients and teaching time   Critical care was necessary to treat or prevent imminent or life-threatening deterioration of the following conditions:  Circulatory failure Comments:     Severe Anemia   Medications Ordered in ED Medications  0.9 %  sodium chloride infusion (has no administration in time range)  morphine 4 MG/ML injection 4 mg (4 mg Intravenous Given 12/02/20 1204)    ED  Course  I have reviewed the triage vital signs and the nursing notes.  Pertinent labs & imaging results that were available during my care of the patient were reviewed by me and considered in my medical decision making (see chart for details).    MDM Rules/Calculators/A&P                           Patient has no focal neurodeficit regarding his lower back pain.  Appears to be exacerbation of his chronic pain.  Labs are sent and hemoglobin found to be low at 6.0.  Patient will be transfused 2 units of PRBCs as his baseline hemoglobin appears to be around 8.5.  Given the patient is on Eliquis and continue with severe anemia requiring blood transfusion, will present to the hospitalist team.  Final Clinical Impression(s) / ED Diagnoses Final diagnoses:  Severe anemia  Acute low back pain, unspecified back pain laterality, unspecified whether sciatica present    Rx / DC Orders ED Discharge Orders     None        Luna Fuse, MD 12/02/20 1354    Luna Fuse, MD 01/01/21 2209

## 2020-12-02 NOTE — H&P (Signed)
History and Physical    Darryl Diaz DVV:[id number] DOB: 07-06-1940 DOA: 12/02/2020  PCP: [name], MD Consultants:   Carlean Purl - GI; Cooper/Klein - cardiology; Patel - podiatry; [facility] - nephrology Patient coming from:  [facility], sister and brother-in-law live in [place]; [place]: Sister, [phone]  Chief Complaint: Back pain  HPI: Darryl Diaz is a 80 y.o. male with medical history significant of afib on AC; OSA not on CPAP; CAD s/p cardiac arrest; HTN; HLD; chronic diastolic CHF; and ETOH dependence in remission presenting with back pain. He was also seen in the ER for back and hip pain on 7/20 and hip/L-spine xrays unremarkable other than degenerative disease.  He was diagnosed with sciatica and discharged with Norco and Prednisone.  The back still hurts, but this morning it was worse.  He also has wounds on his left foot that are being followed by Dr. Posey Pronto.  He has not felt weak, tired, dizzy, SOB.  He moved to [facility] about 2-3 weeks ago.  His stools have changed since going there due to diet but no black/tarry or frankly bloody stools.    ED Course: Back pain, reluctant to take Norco.  Just feels bad.  Hgb 6, baseline 8.  Has had transfusion in the past, ?GI bleed.  Stool looks normal, heme testing pending.  Ordered 2 units PRBC.  Review of Systems: As per HPI; otherwise review of systems reviewed and negative.   Ambulatory Status:  Ambulates with a walker  COVID Vaccine Status:  Complete plus booster  Past Medical History:  Diagnosis Date   Alcoholism ([place])    in remission following wife's death   Anemia    Ascending aorta dilation (HCC)    Cardiac arrest ([place]) 04/20/2019   12 min CPR with epi   Chronic diastolic CHF (congestive heart failure) ([place])    Fatty liver    Gastritis    GERD (gastroesophageal reflux disease)    H/O seasonal allergies    History of GI bleed    Hx of adenomatous colonic polyps 08/10/2017   Hyperlipidemia     Hypertension    Insomnia    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   Mallory-Weiss tear    OA (osteoarthritis)    hands   OSA (obstructive sleep apnea)    No longer uses CPAP   Persistent atrial fibrillation with rapid ventricular response ([place]) 02/05/2020   Recurrent syncope    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[facility])     Past Surgical History:  Procedure Laterality Date   ANKLE SURGERY Left 2014   had rods put in    BIOPSY  02/28/2019   Procedure: BIOPSY;  Surgeon: Milus Banister, MD;  Location: [place];  Service: Endoscopy;;   CARDIOVERSION  02/06/2020   CARDIOVERSION N/A 02/06/2020   Procedure: CARDIOVERSION;  Surgeon: Werner Lean, MD;  Location: [place] [facility];  Service: Cardiovascular;  Laterality: N/A;   CARDIOVERSION N/A 02/24/2020   Procedure: CARDIOVERSION;  Surgeon: Werner Lean, MD;  Location: [place];  Service: Cardiovascular;  Laterality: N/A;   COLONOSCOPY     [place]   ESOPHAGOGASTRODUODENOSCOPY (EGD) WITH PROPOFOL N/A 02/28/2019   Procedure: ESOPHAGOGASTRODUODENOSCOPY (EGD) WITH PROPOFOL;  Surgeon: Milus Banister, MD;  Location: [facility];  Service: Endoscopy;  Laterality: N/A;   HIP ARTHROPLASTY Left 04/01/2016   Procedure: ARTHROPLASTY BIPOLAR HIP (HEMIARTHROPLASTY);  Surgeon: Paralee Cancel, MD;  Location: [place];  Service: Orthopedics;  Laterality: Left;   HIP CLOSED REDUCTION Left  04/30/2016   Procedure: CLOSED REDUCTION HIP;  Surgeon: Wylene Simmer, MD;  Location: [place];  Service: Orthopedics;  Laterality: Left;   TONSILLECTOMY     TOTAL HIP ARTHROPLASTY Right 10/15/2019   Procedure: TOTAL HIP ARTHROPLASTY ANTERIOR APPROACH;  Surgeon: Paralee Cancel, MD;  Location: [facility];  Service: Orthopedics;  Laterality: Right;  70 mins    Social History   Socioeconomic History   Marital status: Widowed    Spouse name: Not on file   Number of children: 2   Years of education: college   Highest education level: Not on file   Occupational History   Occupation: retired  Tobacco Use   Smoking status: Former    Packs/day: 1.00    Years: 20.00    Pack years: 20.00    Types: Cigarettes   Smokeless tobacco: Never   Tobacco comments:       quit 20 years  smoked on and off for 20 years  Vaping Use   Vaping Use: Never used  Substance and Sexual Activity   Alcohol use: Not Currently    Comment: h/o heavy use   Drug use: Never   Sexual activity: Not Currently  Other Topics Concern   Not on file  Social History Narrative   ** Merged History Encounter **       The patient is divorced w/ 1 son 1 daughter He is a retired Regulatory affairs officer, he lived in [place] but [employer] including windows on the world in the [employer] room in [place] He moved to [place] to be near his sister who liv   es in [place] alcoholic member of AA 2 caffeinated beverages daily prior smoker no drugs   Social Determinants of Radio broadcast assistant Strain: Low Risk    Difficulty of Paying Living Expenses: Not hard at [employer] Insecurity: No Food Insecurity   Worried About Charity fundraiser in the Last Year: Never true   Arboriculturist in the Last Year: Never true  Transportation Needs: No Transportation Needs   Lack of Transportation (Medical): No   Lack of Transportation (Non-Medical): No  Physical Activity: Not on file  Stress: No Stress Concern Present   Feeling of Stress : Not at all  Social Connections: Moderately Integrated   Frequency of Communication with Friends and Family: More than three times a week   Frequency of Social Gatherings with Friends and Family: More than three times a week   Attends Religious Services: 1 to 4 times per year   Active Member of [employer] or Organizations: No   Attends Archivist Meetings: 1 to 4 times per year   Marital Status: Widowed  Intimate Partner Violence: Not At Risk   Fear of Current or Ex-Partner: No   Emotionally Abused: No    Physically Abused: No   Sexually Abused: No    Allergies  Allergen Reactions   Penicillin G Sodium     Other reaction(s): rash   Penicillins Other (See Comments)    Tolerated Cephalosporin Date: 10/15/19. Allergy is from childhood Did it involve swelling of the face/tongue/throat, SOB, or low BP? Unknown Did it involve sudden or severe rash/hives, skin peeling, or any reaction on the inside of your mouth or nose? Unknown Did you need to seek medical attention at a hospital or doctor's office? Unknown When did it last happen?    childhood reaction   If all above answers are "NO", may proceed  with cephalosporin use.   Penicillins     Family History  Problem Relation Age of Onset   Heart disease Mother 52   Hypertension Mother    Arthritis Mother    Heart failure Father 3   Stroke Maternal Aunt    Heart failure Sister    Colon cancer Neg Hx    Esophageal cancer Neg Hx    Stomach cancer Neg Hx    Rectal cancer Neg Hx     Prior to Admission medications   Medication Sig Start Date End Date Taking? Authorizing Provider  acetaminophen (TYLENOL) 325 MG tablet Take 2 tablets (650 mg total) by mouth every 4 (four) hours as needed for headache or mild pain. 03/10/20  Yes Angiulli, Lavon Paganini, PA-C  amiodarone (PACERONE) 200 MG tablet Take 1 tablet (200 mg total) by mouth daily. 04/10/20  Yes Dunn, Dayna N, PA-C  amLODipine (NORVASC) 5 MG tablet Take 1 tablet (5 mg total) by mouth daily. 09/14/20  Yes Gherghe, Vella Redhead, MD  apixaban (ELIQUIS) 5 MG TABS tablet Take 1 tablet (5 mg total) by mouth 2 (two) times daily. 10/17/20  Yes Eugenie Filler, MD  atorvastatin (LIPITOR) 20 MG tablet Take 1 tablet (20 mg total) by mouth daily. 03/10/20  Yes Angiulli, Lavon Paganini, PA-C  escitalopram (LEXAPRO) 10 MG tablet Take 1 tablet (10 mg total) by mouth daily. 03/10/20  Yes Angiulli, Lavon Paganini, PA-C  folic acid (FOLVITE) 1 MG tablet Take 1 tablet (1 mg total) by mouth daily. 03/10/20  Yes Angiulli, Lavon Paganini, PA-C  HYDROcodone-acetaminophen (NORCO/VICODIN) 5-325 MG tablet Take 1 tablet by mouth every 6 (six) hours as needed. 11/25/20  Yes Dorie Rank, MD  ketoconazole (NIZORAL) 2 % cream Apply 1 application topically daily. To dry scaly skin on feet 07/23/20  Yes McDonald, Stephan Minister, DPM  loratadine (CLARITIN) 10 MG tablet Take 1 tablet (10 mg total) by mouth daily. 02/12/20  Yes Medina-Vargas, Monina C, NP  Magnesium Oxide 400 MG CAPS Take 1 capsule (400 mg total) by mouth daily. 10/17/20  Yes Eugenie Filler, MD  Multiple Vitamins-Minerals (CENTRUM SILVER 50+MEN) TABS Take 1 tablet by mouth daily with breakfast.   Yes [provider]  nitroGLYCERIN (NITROSTAT) 0.4 MG SL tablet Place 1 tablet (0.4 mg total) under the tongue every 5 (five) minutes x 3 doses as needed for chest pain. 07/28/20  Yes Baldwin Jamaica, PA-C  pantoprazole (PROTONIX) 40 MG tablet Take 1 tablet (40 mg total) by mouth daily. 03/10/20  Yes Angiulli, Lavon Paganini, PA-C  potassium chloride SA (KLOR-CON) 20 MEQ tablet Take 1 tablet (20 mEq total) by mouth daily. 10/17/20 10/12/21 Yes Eugenie Filler, MD  torsemide (DEMADEX) 20 MG tablet Take 60 mg by mouth See admin instructions. TAKE 3 TABLETS BY MOUTH EVERY MORNING, MAY TAKE A SECOND DOSE OF 3 TABLETS IN THE AFTERNOON AS NEEDED FOR SWELLING   Yes [provider]  traZODone (DESYREL) 50 MG tablet Take 1 tablet (50 mg total) by mouth at bedtime. 03/10/20  Yes Angiulli, Lavon Paganini, PA-C  collagenase (SANTYL) ointment Apply 1 application topically daily. Patient not taking: No sig reported 10/26/20   Felipa Furnace, DPM  doxycycline (VIBRA-TABS) 100 MG tablet TAKE 1 TABLET BY MOUTH TWICE A DAY FOR 14 DAYS Patient not taking: No sig reported 10/19/20   [provider]  ferrous sulfate 325 (65 FE) MG tablet Take 1 tablet (325 mg total) by mouth 2 (two) times daily with a meal.  Patient taking differently: Take 325 mg by mouth daily. 03/10/20 04/10/20  Angiulli, Lavon Paganini, PA-C   LAGEVRIO 200 MG CAPS Take by mouth. Patient not taking: No sig reported 11/12/20   [provider]  tamsulosin (FLOMAX) 0.4 MG CAPS capsule Take 1 capsule (0.4 mg total) by mouth every evening. Patient not taking: No sig reported 03/10/20   Cathlyn Parsons, PA-C    Physical Exam: Vitals:   12/02/20 1345 12/02/20 1415 12/02/20 1445 12/02/20 1503  BP: (!) 178/77 (!) 172/74 (!) 182/81 (!) 154/140  Pulse: 96 87 88 91  Resp: (!) 28 18 19  (!) 22  Temp:    97.9 F (36.6 C)  TempSrc:    Oral  SpO2: 99% 99% 98% 99%     General:  Appears calm and comfortable and is in NAD; disheveled, poor foot hygiene Eyes:  PERRL, EOMI, normal lids, iris ENT:  grossly normal hearing, lips & tongue, mmm Neck:  no LAD, masses or thyromegaly Cardiovascular:  RRR, no m/r/g. 2-3+ chronic LE edema.  Respiratory:   CTA bilaterally with no wheezes/rales/rhonchi.  Normal to mildly increased respiratory effort. 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       Psychiatric:  grossly normal mood and affect, speech fluent and appropriate, AOx3 Neurologic:  CN 2-12 grossly intact, moves all extremities in coordinated fashion, appears sedentary    Radiological Exams on Admission: Independently reviewed - see discussion in A/P where applicable  No results found.  EKG: Independently reviewed.  NSR with rate 94; prolonged QTc 513; nonspecific ST changes with no evidence of acute ischemia   Labs on Admission: I have personally reviewed the available labs and imaging studies at the time of the admission.  Pertinent labs:   Na++ 132 BUN 38/Creatinine 3.44/GFR 17 - stable WBC 7.0 Hgb 6.0; baseline 8 as of 6 weeks ago MCV 102.2 UA: large Hgb, 100  protein   Assessment/Plan Principal Problem:   Symptomatic anemia Active Problems:   Hypertension   Hyperlipidemia   Depressive disorder   PAF (paroxysmal atrial fibrillation) (HCC)   CKD (chronic kidney disease) stage 4, GFR 15-29 ml/min (HCC)   Chronic heart failure with preserved ejection fraction (HFpEF) (HCC)    Anemia of chronic disease -Patient with known anemia of chronic disease associated with renal dysfunction -During prior admission, he required transfusion of 1 unit of PRBC -Currently here and found to have Hgb 6.0 -MCV is >100, macrocytic -Heme testing is negative so will continue Eliquis (but will renally adjust the dose). -Will observe overnight in med surg bed. -Transfuse 2 units PRBC  -Dr. 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 Joelyn Oms. -Patient  was previously on high-dose prednisone but does not appear to be taking at this time. -Renal function slightly worsened from prior hospitalization   -Patient does not appear to be a good candidate for HD and also reports that he would not want this. -Patient discussed with Dr. Joelyn Oms today. -Outpatient follow-up with nephrology. -He has received his first dose of Rituximab, according to Dr. Joelyn Oms.   HTN -Continue Norvasc   HLD -Continue Lipitor   Chronic  diastolic CHF -Preserved EF on 10/17 echo -Patient maintained on torsemide with persistent edema but no other evidence of decompensation at this time -Will continue Torsemide  Stasis with dermatitis -Appears to benefit from [employer]; will request wound care consult -Currently low suspicion for cellulitis -Has L foot ulcers that appear to be healing with assistance from podiatry  Mood d/o -Struggled significantly with his mood after the loss if his wife -Recently (2-3 weeks ago) moved into ALF and seems to be happy there (other than COVID infection shortly after arrival) -Continue Lexapro, Trazodone    Note: This patient was recently positive for COVID-19; assumin gwe can obtain this test result he will not need to be retested. The patient has been fully vaccinated against COVID-19.   Level of care: Med-Surg DVT prophylaxis: Eliquis Code Status:  Full - confirmed with patient Family Communication: None present Disposition Plan:  The patient is from: ALF  Anticipated d/c is to: ALF  Anticipated d/c date will depend on clinical response to treatment, but possibly as early as tomorrow if  he has excellent response to treatment  Patient is currently: acutely ill Consults called: Nephrology - telephone only; Nutrition; PT/OT; Wound care  Admission status:  It is my clinical opinion that referral for OBSERVATION is reasonable and necessary in this patient based on the above information provided. The aforementioned taken together are felt to place the patient at high risk for further clinical deterioration. However it is anticipated that the patient may be medically stable for discharge from the hospital within 24 to 48 hours.    Karmen Bongo MD [facility]   How to contact the [facility] Attending or Consulting provider [place] or covering provider during after hours [place], for this patient?  Check the care team in [facility] and look for a) attending/consulting [facility] provider listed and  b) the [facility] team listed Log into [email or web address] and use [place]'s universal password to access. If you do not have the password, please contact the hospital operator. Locate the [facility] provider you are looking for under [facility] and page to a number that you can be directly reached. If you still have difficulty reaching the provider, please page the [facility] (Director on Call) for the Hospitalists listed on amion for assistance.   12/02/2020, 3:53 PM

## 2020-12-02 NOTE — ED Triage Notes (Signed)
Was here last week for back pain was given norco for pain but states he doesn't want to get addicted. Facility was concerned about his HTN but did not give him his morning HTN medications

## 2020-12-03 DIAGNOSIS — I5032 Chronic diastolic (congestive) heart failure: Secondary | ICD-10-CM | POA: Diagnosis not present

## 2020-12-03 DIAGNOSIS — N184 Chronic kidney disease, stage 4 (severe): Secondary | ICD-10-CM | POA: Diagnosis not present

## 2020-12-03 DIAGNOSIS — D649 Anemia, unspecified: Secondary | ICD-10-CM | POA: Diagnosis not present

## 2020-12-03 LAB — BPAM RBC
Blood Product Expiration Date: 202208212359
Blood Product Expiration Date: 202208242359
ISSUE DATE / TIME: 202207271448
ISSUE DATE / TIME: 202207271922
Unit Type and Rh: 5100
Unit Type and Rh: 5100

## 2020-12-03 LAB — BASIC METABOLIC PANEL
Anion gap: 9 (ref 5–15)
BUN: 38 mg/dL — ABNORMAL HIGH (ref 8–23)
CO2: 24 mmol/L (ref 22–32)
Calcium: 8.7 mg/dL — ABNORMAL LOW (ref 8.9–10.3)
Chloride: 103 mmol/L (ref 98–111)
Creatinine, Ser: 3.48 mg/dL — ABNORMAL HIGH (ref 0.61–1.24)
GFR, Estimated: 17 mL/min — ABNORMAL LOW (ref >60)
Glucose, Bld: 87 mg/dL (ref 70–99)
Potassium: 4 mmol/L (ref 3.5–5.1)
Sodium: 136 mmol/L (ref 135–145)

## 2020-12-03 LAB — CBC
HCT: 24.1 % — ABNORMAL LOW (ref 39.0–52.0)
Hemoglobin: 8.3 g/dL — ABNORMAL LOW (ref 13.0–17.0)
MCH: 33.6 pg (ref 26.0–34.0)
MCHC: 34.4 g/dL (ref 30.0–36.0)
MCV: 97.6 fL (ref 80.0–100.0)
Platelets: 250 10*3/uL (ref 150–400)
RBC: 2.47 MIL/uL — ABNORMAL LOW (ref 4.22–5.81)
RDW: 15.9 % — ABNORMAL HIGH (ref 11.5–15.5)
WBC: 6.8 10*3/uL (ref 4.0–10.5)
nRBC: 0 % (ref 0.0–0.2)

## 2020-12-03 LAB — TYPE AND SCREEN
ABO/RH(D): O POS
Antibody Screen: NEGATIVE
Unit division: 0
Unit division: 0

## 2020-12-03 LAB — SARS CORONAVIRUS 2 (TAT 6-24 HRS): SARS Coronavirus 2: NEGATIVE

## 2020-12-03 MED ORDER — APIXABAN 2.5 MG PO TABS: 2.5000 mg | ORAL_TABLET | Freq: Two times a day (BID) | ORAL | 0 refills | Status: DC

## 2020-12-03 MED ORDER — FERROUS SULFATE 325 (65 FE) MG PO TABS: 325.0000 mg | ORAL_TABLET | Freq: Every day | ORAL | Status: DC

## 2020-12-03 MED ORDER — METHOCARBAMOL 500 MG PO TABS: 500.0000 mg | ORAL_TABLET | Freq: Four times a day (QID) | ORAL | 0 refills | Status: DC | PRN

## 2020-12-03 NOTE — Progress Notes (Signed)
Physical Therapy Evaluation Patient Details Name: Darryl Diaz MRN: [id number] DOB: Mar 07, 1941 Today's Date: 12/03/2020   History of Present Illness  80 y.o. male admitted 7/27  presenting with back pain.  On presentation, hemoglobin was 6; baseline was 8.  No history of black or bloody stools.  Patient was transfused 2 unit packed red cells during the hospitalization and w/u for GIB. Note that pt also with left great toe wound.  PMH: afib on AC; OSA not on CPAP; CAD s/p cardiac arrest; HTN; HLD; chronic diastolic CHF; and ETOH dependence  Clinical Impression  Pt admitted with above diagnosis. Pt was able to ambulate with RW with mod assist due to pt impulsivity and decr overall safety with gait.  Pt with lateral sway and noted bleeding on left great toe notifying nursing.  Pt with poor safety awareness overall and would benefit from a short term SNF prior to D/C home.  Pt currently with functional limitations due to the deficits listed below (see PT Problem List). Pt will benefit from skilled PT to increase their independence and safety with mobility to allow discharge to the venue listed below.        Follow Up Recommendations Home health PT;Supervision/Assistance - 24 hour (If pt doesnt have 24 hour assist, will need short term SNF)    Equipment Recommendations  None recommended by PT    Recommendations for Other Services       Precautions / Restrictions Precautions Precautions: Fall Precaution Comments: denies falls Required Braces or Orthoses: Other Brace Other Brace: post op shoe Restrictions Weight Bearing Restrictions: No      Mobility  Bed Mobility Overal bed mobility: Needs Assistance Bed Mobility: Rolling;Supine to Sit Rolling: Supervision   Supine to sit: Supervision     General bed mobility comments: Needs close supervision due to impulsivity.    Transfers Overall transfer level: Needs assistance Equipment used: Rolling walker (2 wheeled) Transfers: Sit  to/from Stand Sit to Stand: Mod assist;+2 safety/equipment;From elevated surface         General transfer comment: Pt needed multiple attempts, cues and assist to power up as pt having diffuculty with hand placement.  Pt pushes his LEs onto bed to stand and states that he does that at home and doesnt lose balance.  Pt with posterior lean and losing balance back onto bed unless assisted.  Pt did better with cues for 1 hand on RW and one hand on bed to push up but continued to need min to mod assist due to pt with decr safety and impulsive with movement.  Ambulation/Gait Ambulation/Gait assistance: Mod assist;+2 safety/equipment Gait Distance (Feet): 110 Feet Assistive device: Rolling walker (2 wheeled) Gait Pattern/deviations: Decreased weight shift to left;Decreased dorsiflexion - left;Decreased stance time - left;Decreased step length - left;Decreased stride length;Antalgic;Leaning posteriorly;Drifts right/left;Trunk flexed   Gait velocity interpretation: <1.31 ft/sec, indicative of household ambulator General Gait Details: Pt able to ambulate however unsteady gait overall with cues and asswist needed for safety. Pt with incr lateral sway to right and left with ambulation as pt was appearing to accomodate swaying left to move right LE through and swaying to right to keep weight off of left LE as noted midway through ambulation that pts foot was bleeding - great toe bleeding and nursing made aware.  Pt needed cues to stay in proximity to RW as well as to steer RW at times.  Pt kept stating he would do better with his rollator but discussed that pt would most likely  have less stability with rollator as it swivels wheels.  Discussed that pt must have his family there 24 hours at d/c and if he doesnt would need SNF short term for therapy.  Stairs            Wheelchair Mobility    Modified Rankin (Stroke Patients Only)       Balance Overall balance assessment: Needs  assistance Sitting-balance support: No upper extremity supported;Feet supported Sitting balance-Leahy Scale: Fair     Standing balance support: Bilateral upper extremity supported;During functional activity Standing balance-Leahy Scale: Poor Standing balance comment: relies on RW and external support                             Pertinent Vitals/Pain Pain Assessment: Faces Faces Pain Scale: Hurts little more Pain Location: left great toe Pain Descriptors / Indicators: Aching;Discomfort;Grimacing;Guarding Pain Intervention(s): Limited activity within patient's tolerance;Monitored during session;Repositioned    Home Living Family/patient expects to be discharged to:: Private residence (ILF) Living Arrangements: Alone Available Help at Discharge: Family;Available PRN/intermittently Type of Home: Independent living facility Home Access: Level entry;Elevator     Home Layout: One level Home Equipment: Walker - 2 wheels;Walker - 4 wheels;Wheelchair - manual;Hand held shower head;Adaptive equipment;Shower seat;Shower seat - built in Additional Comments: cooks his breakfast in his apartment    Prior Function Level of Independence: Independent with assistive device(s)         Comments: Uses rollator, uses 2 reachers to don socks     Hand Dominance   Dominant Hand: Right    Extremity/Trunk Assessment   Upper Extremity Assessment Upper Extremity Assessment: Defer to OT evaluation    Lower Extremity Assessment Lower Extremity Assessment: Generalized weakness    Cervical / Trunk Assessment Cervical / Trunk Assessment: Kyphotic  Communication   Communication: No difficulties  Cognition Arousal/Alertness: Awake/alert Behavior During Therapy: WFL for tasks assessed/performed Overall Cognitive Status: Within Functional Limits for tasks assessed                                        General Comments General comments (skin integrity, edema,  etc.): Edema bil LEs and bleeding noted on left great toe    Exercises     Assessment/Plan    PT Assessment Patient needs continued PT services  PT Problem List Decreased activity tolerance;Decreased balance;Decreased coordination;Decreased mobility;Decreased safety awareness;Decreased knowledge of precautions;Decreased knowledge of use of DME;Pain;Decreased skin integrity       PT Treatment Interventions DME instruction;Gait training;Functional mobility training;Therapeutic activities;Therapeutic exercise;Balance training;Patient/family education    PT Goals (Current goals can be found in the Care Plan section)  Acute Rehab PT Goals Patient Stated Goal: return to ALF PT Goal Formulation: With patient Time For Goal Achievement: 12/17/20 Potential to Achieve Goals: Good    Frequency Min 3X/week   Barriers to discharge        Co-evaluation               AM-PAC PT "6 Clicks" Mobility  Outcome Measure Help needed turning from your back to your side while in a flat bed without using bedrails?: A Little Help needed moving from lying on your back to sitting on the side of a flat bed without using bedrails?: A Little Help needed moving to and from a bed to a chair (including a wheelchair)?: A Little Help needed standing up  from a chair using your arms (e.g., wheelchair or bedside chair)?: A Lot Help needed to walk in hospital room?: A Lot Help needed climbing 3-5 steps with a railing? : A Lot 6 Click Score: 15    End of Session Equipment Utilized During Treatment: Gait belt;Oxygen Activity Tolerance: Patient limited by fatigue Patient left: in chair;with call bell/phone within reach;with chair alarm set Nurse Communication: Mobility status (toe bleeding and NT made aware to place condom cath back on) PT Visit Diagnosis: Muscle weakness (generalized) (M62.81);Other abnormalities of gait and mobility (R26.89);Unsteadiness on feet (R26.81)    Time: 3685-9923 PT Time  Calculation (min) (ACUTE ONLY): 31 min   Charges:   PT Evaluation $PT Eval Moderate Complexity: 1 Mod PT Treatments $Gait Training: 8-22 mins        Starsky Nanna M,PT Acute Rehab Services [phone] (pager)   Alvira Philips 12/03/2020, 11:01 AM

## 2020-12-03 NOTE — NC FL2 (Addendum)
[place] LEVEL OF CARE SCREENING TOOL     IDENTIFICATION  Patient Name: ANKUR SNOWDON Birthdate: 1940/11/14 Sex: male Admission Date (Current Location): 12/02/2020  [facility] and [place] Number:  Herbalist and Address:  [facility] 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   Orthostatic hypotension 11/23/2020   Acute renal insufficiency 10/23/2020   Alcohol abuse 10/23/2020   Hypokalemia 10/23/2020   Hypomagnesemia 10/23/2020   Low back pain at multiple sites 10/23/2020   Malnutrition of mild degree [place]: 75% to less than 90% of standard weight) ([place]) 10/23/2020   Open wound of scalp without complication 41/66/0630   Oral candidiasis 10/23/2020   Cellulitis of left foot    Cellulitis of toe of left foot 10/13/2020   Skin ulcer of left great toe ([place]) 10/13/2020   CKD (chronic kidney disease) stage 4, GFR 15-29 ml/min ([place]) 10/13/2020   Chronic heart failure with preserved ejection fraction (HFpEF) ([place]) 10/13/2020   Transient weakness of left lower extremity 09/07/2020   Heme positive stool 05/14/2020   Abnormality of gait 04/27/2020   Salmonella food poisoning 16/05/930   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[place]) 03/17/2020   Yeast infection 03/16/2020   Diarrhea 03/16/2020   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 ([place])    Allergies    Anemia, chronic  disease    History of hypertension    Debility    Syncope and collapse 02/22/2020   AKI (acute kidney injury) ([place]) 02/10/2020   Unspecified protein-calorie malnutrition ([place]) 02/10/2020   Weakness 02/06/2020   History of repair of hip joint 10/15/2019   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   Pain in right foot 06/27/2019   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   Bradycardia 04/20/2019   Chronic diastolic heart failure ([place]) 03/14/2019   Atrial fibrillation with rapid ventricular response ([place])    Gastrointestinal hemorrhage 02/25/2019   Fall at home, initial encounter 02/25/2019   Bilateral lower extremity edema 02/25/2019   Hyponatremia 02/25/2019   Fall 02/25/2019   Benign prostatic hyperplasia with lower urinary tract symptoms 12/19/2018   Iron deficiency anemia due to chronic blood loss 08/11/2018   Anemia 08/11/2018   Hardening of the aorta (main artery of the heart) ([place]) 08/11/2018   Benign neoplasm of colon 06/18/2018   History of adenomatous polyp of colon 08/10/2017   PAF (paroxysmal atrial fibrillation) ([place]) 02/05/2017   Impaired fasting glucose 06/10/2016   Encounter for general adult medical examination without abnormal findings 06/03/2016   Posterior dislocation of hip, closed ([place]) 04/30/2016   Hip fracture ([place]) 03/30/2016   Hypertension 03/30/2016   Hyperlipidemia 03/30/2016   Osteoarthritis 03/30/2016   Depressive disorder 03/30/2016   Rhabdomyolysis 03/30/2016   Leukocytosis 03/30/2016   Pressure ulcer 03/30/2016   Fracture of hip ([place]) 03/30/2016   Gastroesophageal reflux disease 12/17/2015   Paroxysmal atrial fibrillation ([place]) 12/17/2015   Hypertensive disorder 12/17/2015   Long term (current) use  of anticoagulants 12/17/2015   Moderate major depression, single episode ([place]) 12/17/2015   Pure hypercholesterolemia 12/17/2015   Seasonal allergic rhinitis 12/17/2015   CAD (coronary artery disease) 08/21/2015   New onset atrial fibrillation ([place])  11/04/2014    Orientation RESPIRATION BLADDER Height & Weight     Self, Time, Situation  Normal Continent Weight: 240 lb (108.9 kg) Height:  6' (182.9 cm)  BEHAVIORAL SYMPTOMS/MOOD NEUROLOGICAL BOWEL NUTRITION STATUS      Continent Diet (See d/c summary)  AMBULATORY STATUS COMMUNICATION OF NEEDS Skin   Extensive Assist Verbally Normal                       Personal Care Assistance Level of Assistance  Bathing, Feeding, Dressing Bathing Assistance: Maximum assistance Feeding assistance: Independent Dressing Assistance: Maximum assistance     Functional Limitations Info  Sight, Speech, Hearing Sight Info: Impaired Hearing Info: Adequate Speech Info: Adequate    SPECIAL CARE FACTORS FREQUENCY  PT (By licensed PT), OT (By licensed OT)     PT Frequency: 5x/week OT Frequency: 5x/week            Contractures Contractures Info: Not present    Additional Factors Info  Code Status, Allergies Code Status Info: Full Allergies Info: Penicillin G Sodium, penecillins           Current Medications (12/03/2020):  This is the current hospital active medication list Current Facility-Administered Medications  Medication Dose Route Frequency Provider Last Rate Last Admin   acetaminophen (TYLENOL) tablet 650 mg  650 mg Oral Q6H PRN Karmen Bongo, MD       Or   acetaminophen (TYLENOL) suppository 650 mg  650 mg Rectal Q6H PRN Karmen Bongo, MD       amiodarone (PACERONE) tablet 200 mg  200 mg Oral Daily Karmen Bongo, MD   200 mg at 12/03/20 0856   amLODipine (NORVASC) tablet 5 mg  5 mg Oral Daily Karmen Bongo, MD   5 mg at 12/03/20 8546   apixaban (ELIQUIS) tablet 2.5 mg  2.5 mg Oral BID Karmen Bongo, MD   2.5 mg at 12/03/20 2703   atorvastatin (LIPITOR) tablet 20 mg  20 mg Oral Daily Karmen Bongo, MD   20 mg at 12/03/20 5009   calcium carbonate (dosed in mg elemental calcium) suspension 500 mg of elemental calcium  500 mg of elemental calcium Oral Q6H PRN  Karmen Bongo, MD       camphor-menthol [facility]) lotion 1 application  1 application Topical F8H PRN Karmen Bongo, MD       And   hydrOXYzine (ATARAX/VISTARIL) tablet 25 mg  25 mg Oral Q8H PRN Karmen Bongo, MD       docusate sodium [facility]) enema 283 mg  1 enema Rectal PRN Karmen Bongo, MD       escitalopram (LEXAPRO) tablet 10 mg  10 mg Oral Daily Karmen Bongo, MD   10 mg at 12/03/20 0901   feeding supplement (NEPRO CARB STEADY) liquid 237 mL  237 mL Oral TID PRN Karmen Bongo, MD       ferrous sulfate tablet 325 mg  325 mg Oral BID WC Karmen Bongo, MD   325 mg at 82/99/37 1696   folic acid (FOLVITE) tablet 1 mg  1 mg Oral Daily Karmen Bongo, MD   1 mg at 12/03/20 7893   hydrALAZINE (APRESOLINE) injection 5 mg  5 mg Intravenous Q4H PRN Karmen Bongo, MD       HYDROcodone-acetaminophen (NORCO/VICODIN) [phone]  MG per tablet 1 tablet  1 tablet Oral Q6H PRN Karmen Bongo, MD   1 tablet at 12/03/20 0359   loratadine (CLARITIN) tablet 10 mg  10 mg Oral Daily Karmen Bongo, MD   10 mg at 12/03/20 0900   magnesium oxide (MAG-OX) tablet 400 mg  400 mg Oral Daily Karmen Bongo, MD   400 mg at 12/03/20 3007   methocarbamol (ROBAXIN) tablet 500 mg  500 mg Oral Q6H PRN Karmen Bongo, MD       multivitamin with minerals tablet 1 tablet  1 tablet Oral Daily Heloise Purpura, RPH   1 tablet at 12/03/20 0859   pantoprazole (PROTONIX) EC tablet 40 mg  40 mg Oral Daily Karmen Bongo, MD   40 mg at 12/03/20 0858   sorbitol 70 % solution 30 mL  30 mL Oral PRN Karmen Bongo, MD       torsemide [facility]) tablet 60 mg  60 mg Oral Daily Heloise Purpura, RPH   60 mg at 12/03/20 0901   traZODone (DESYREL) tablet 50 mg  50 mg Oral Ivery Quale, MD       zolpidem Lorrin Mais) tablet 5 mg  5 mg Oral QHS PRN Karmen Bongo, MD       Facility-Administered Medications Ordered in Other Encounters  Medication Dose Route Frequency Provider Last Rate Last Admin   metoprolol tartrate  (LOPRESSOR) injection    Anesthesia Intra-op Mariea Clonts, CRNA   5 mg at 02/27/19 1318     Discharge Medications: Please see discharge summary for a list of discharge medications.  Relevant Imaging Results:  Relevant Lab Results:   Additional Information [id number] Covid vaccinated x3  Bethann Berkshire, LCSW

## 2020-12-03 NOTE — Evaluation (Signed)
Occupational Therapy Evaluation Patient Details Name: Darryl Diaz MRN: [id number] DOB: Dec 10, 1940 Today's Date: 12/03/2020    History of Present Illness Pt is a 80 year old man admitted on 12/02/20 with Hgb of 6 and back pain from [place]. Received 2 units of blood. PMH: foot wounds, afib, OSA, CAD, HTN, HLD, CHF, ETOH dependence in remission.   Clinical Impression   Pt is functioning at his baseline in ADL and ADL transfers. He denies any recent falls. All equipment needs are met in his home. Pt is discharging home today, no further OT needs.     Follow Up Recommendations  No OT follow up    Equipment Recommendations  None recommended by OT    Recommendations for Other Services       Precautions / Restrictions Precautions Precautions: Fall Precaution Comments: denies falls Required Braces or Orthoses: Other Brace Other Brace: post op shoe Restrictions Weight Bearing Restrictions: No      Mobility Bed Mobility Overal bed mobility: Modified Independent             General bed mobility comments: increased time, but no assist to return to bed    Transfers Overall transfer level: Needs assistance Equipment used: Rolling walker (2 wheeled) Transfers: Sit to/from Stand Sit to Stand: Supervision         General transfer comment: supervised for safety    Balance Overall balance assessment: Needs assistance   Sitting balance-Leahy Scale: Good     Standing balance support: Bilateral upper extremity supported Standing balance-Leahy Scale: Fair                             ADL either performed or assessed with clinical judgement   ADL                                         General ADL Comments: Supervised for safety, likely at his baseline.     Vision Baseline Vision/History: Wears glasses Wears Glasses: At all times Patient Visual Report: No change from baseline       Perception     Praxis      Pertinent  Vitals/Pain Pain Assessment: No/denies pain     Hand Dominance Right   Extremity/Trunk Assessment Upper Extremity Assessment Upper Extremity Assessment: Overall WFL for tasks assessed   Lower Extremity Assessment Lower Extremity Assessment: Defer to PT evaluation       Communication Communication Communication: No difficulties   Cognition Arousal/Alertness: Awake/alert Behavior During Therapy: WFL for tasks assessed/performed Overall Cognitive Status: Within Functional Limits for tasks assessed                                     General Comments       Exercises     Shoulder Instructions      Home Living Family/patient expects to be discharged to:: Private residence (ILF) Living Arrangements: Alone Available Help at Discharge: Family;Available PRN/intermittently Type of Home: Independent living facility Home Access: Level entry;Elevator     Home Layout: One level     Bathroom Shower/Tub: Occupational psychologist: Handicapped height     Home Equipment: Environmental consultant - 2 wheels;Walker - 4 wheels;Wheelchair - manual;Hand held shower head;Adaptive equipment;Shower seat;Shower seat - built in [employer]:  Long-handled shoe horn;Reacher;Long-handled sponge Additional Comments: cooks his breakfast in his apartment      Prior Functioning/Environment Level of Independence: Independent with assistive device(s)        Comments: Uses rollator, uses 2 reachers to don socks        OT Problem List:        OT Treatment/Interventions:      OT Goals(Current goals can be found in the care plan section) Acute Rehab OT Goals Patient Stated Goal: return to ILF OT Goal Formulation: With patient  OT Frequency:     Barriers to D/C:            Co-evaluation              AM-PAC OT "6 Clicks" Daily Activity     Outcome Measure Help from another person eating meals?: None Help from another person taking care of personal grooming?:  None Help from another person toileting, which includes using toliet, bedpan, or urinal?: None Help from another person bathing (including washing, rinsing, drying)?: None Help from another person to put on and taking off regular upper body clothing?: None Help from another person to put on and taking off regular lower body clothing?: None 6 Click Score: 24   End of Session Equipment Utilized During Treatment: Rolling walker  Activity Tolerance: Patient tolerated treatment well Patient left: in bed;with call bell/phone within reach;with bed alarm set  OT Visit Diagnosis: Other abnormalities of gait and mobility (R26.89)                Time: 7622-6333 OT Time Calculation (min): 17 min Charges:  OT General Charges $OT Visit: 1 Visit OT Evaluation $OT Eval Moderate Complexity: 1 Mod  Nestor Lewandowsky, OTR/L Acute Rehabilitation Services Pager: [phone] Office: [phone]  Malka So 12/03/2020, 10:36 AM

## 2020-12-03 NOTE — Plan of Care (Signed)

## 2020-12-03 NOTE — Progress Notes (Deleted)
Physical Therapy Treatment Patient Details Name: Darryl Diaz MRN: [id number] DOB: [birth year] Today's Date: 12/03/2020    History of Present Illness 80 y.o. male admitted 7/27  presenting with back pain.  On presentation, hemoglobin was 6; baseline was 8.  No history of black or bloody stools.  Patient was transfused 2 unit packed red cells during the hospitalization and w/u for GIB. Note that pt also with left great toe wound.  PMH: afib on AC; OSA not on CPAP; CAD s/p cardiac arrest; HTN; HLD; chronic diastolic CHF; and ETOH dependence    PT Comments    Pt admitted with above diagnosis. Pt was able to ambulate with RW with mod assist due to pt impulsivity and decr overall safety with gait.  Pt with lateral sway and noted bleeding on left great toe notifying nursing.  Pt with poor safety awareness overall and would benefit from a short term SNF prior to D/C home.  Pt currently with functional limitations due to the deficits listed below (see PT Problem List). Pt will benefit from skilled PT to increase their independence and safety with mobility to allow discharge to the venue listed below.      Follow Up Recommendations  Home health PT;Supervision/Assistance - 24 hour (If pt doesnt have 24 hour assist, will need short term SNF)     Equipment Recommendations  None recommended by PT    Recommendations for Other Services       Precautions / Restrictions Precautions Precautions: Fall Precaution Comments: denies falls Required Braces or Orthoses: Other Brace Other Brace: post op shoe Restrictions Weight Bearing Restrictions: No    Mobility  Bed Mobility Overal bed mobility: Needs Assistance Bed Mobility: Rolling;Supine to Sit Rolling: Supervision   Supine to sit: Supervision     General bed mobility comments: Needs close supervision due to impulsivity.    Transfers Overall transfer level: Needs assistance Equipment used: Rolling walker (2 wheeled) Transfers: Sit to/from  Stand Sit to Stand: Mod assist;+2 safety/equipment;From elevated surface         General transfer comment: Pt needed multiple attempts, cues and assist to power up as pt having diffuculty with hand placement.  Pt pushes his LEs onto bed to stand and states that he does that at home and doesnt lose balance.  Pt with posterior lean and losing balance back onto bed unless assisted.  Pt did better with cues for 1 hand on RW and one hand on bed to push up but continued to need min to mod assist due to pt with decr safety and impulsive with movement.  Ambulation/Gait Ambulation/Gait assistance: Mod assist;+2 safety/equipment Gait Distance (Feet): 110 Feet Assistive device: Rolling walker (2 wheeled) Gait Pattern/deviations: Decreased weight shift to left;Decreased dorsiflexion - left;Decreased stance time - left;Decreased step length - left;Decreased stride length;Antalgic;Leaning posteriorly;Drifts right/left;Trunk flexed   Gait velocity interpretation: <1.31 ft/sec, indicative of household ambulator General Gait Details: Pt able to ambulate however unsteady gait overall with cues and asswist needed for safety. Pt with incr lateral sway to right and left with ambulation as pt was appearing to accomodate swaying left to move right LE through and swaying to right to keep weight off of left LE as noted midway through ambulation that pts foot was bleeding - great toe bleeding and nursing made aware.  Pt needed cues to stay in proximity to RW as well as to steer RW at times.  Pt kept stating he would do better with his rollator but discussed that pt would  most likely have less stability with rollator as it swivels wheels.  Discussed that pt must have his family there 24 hours at d/c and if he doesnt would need SNF short term for therapy.   Stairs             Wheelchair Mobility    Modified Rankin (Stroke Patients Only)       Balance Overall balance assessment: Needs  assistance Sitting-balance support: No upper extremity supported;Feet supported Sitting balance-Leahy Scale: Fair     Standing balance support: Bilateral upper extremity supported;During functional activity Standing balance-Leahy Scale: Poor Standing balance comment: relies on RW and external support                            Cognition Arousal/Alertness: Awake/alert Behavior During Therapy: WFL for tasks assessed/performed Overall Cognitive Status: Within Functional Limits for tasks assessed                                        Exercises      General Comments General comments (skin integrity, edema, etc.): Edema bil LEs and bleeding noted on left great toe      Pertinent Vitals/Pain Pain Assessment: Faces Faces Pain Scale: Hurts little more Pain Location: left great toe Pain Descriptors / Indicators: Aching;Discomfort;Grimacing;Guarding Pain Intervention(s): Limited activity within patient's tolerance;Monitored during session;Repositioned    Home Living Family/patient expects to be discharged to:: Private residence (ILF) Living Arrangements: Alone Available Help at Discharge: Family;Available PRN/intermittently Type of Home: Independent living facility Home Access: Level entry;Elevator   Home Layout: One level Home Equipment: Walker - 2 wheels;Walker - 4 wheels;Wheelchair - manual;Hand held shower head;Adaptive equipment;Shower seat;Shower seat - built in Additional Comments: cooks his breakfast in his apartment    Prior Function Level of Independence: Independent with assistive device(s)      Comments: Uses rollator, uses 2 reachers to don socks   PT Goals (current goals can now be found in the care plan section) Acute Rehab PT Goals Patient Stated Goal: return to ALF PT Goal Formulation: With patient Time For Goal Achievement: 12/17/20 Potential to Achieve Goals: Good    Frequency    Min 3X/week      PT Plan       Co-evaluation              AM-PAC PT "6 Clicks" Mobility   Outcome Measure  Help needed turning from your back to your side while in a flat bed without using bedrails?: A Little Help needed moving from lying on your back to sitting on the side of a flat bed without using bedrails?: A Little Help needed moving to and from a bed to a chair (including a wheelchair)?: A Little Help needed standing up from a chair using your arms (e.g., wheelchair or bedside chair)?: A Lot Help needed to walk in hospital room?: A Lot Help needed climbing 3-5 steps with a railing? : A Lot 6 Click Score: 15    End of Session Equipment Utilized During Treatment: Gait belt;Oxygen Activity Tolerance: Patient limited by fatigue Patient left: in chair;with call bell/phone within reach;with chair alarm set Nurse Communication: Mobility status (toe bleeding and NT made aware to place condom cath back on) PT Visit Diagnosis: Muscle weakness (generalized) (M62.81);Other abnormalities of gait and mobility (R26.89);Unsteadiness on feet (R26.81)     Time: 2482-5003 PT Time  Calculation (min) (ACUTE ONLY): 31 min  Charges:  $Gait Training: 8-22 mins                     Numa Schroeter M,PT Acute Rehab Services [phone] (pager)    Alvira Philips 12/03/2020, 10:47 AM

## 2020-12-03 NOTE — TOC Initial Note (Addendum)
Transition of Care [facility]) - Initial/Assessment Note    Patient Details  Name: Darryl Diaz MRN: [id number] Date of Birth: April 06, 1941  [facility] CM/SW Contact:    Bethann Berkshire, [place] Phone Number: 12/03/2020, 12:10 PM  Clinical Narrative:                  CSW met with pt to discuss disposition. Pt recently moved to [place]. His daughter and son live out of state and will be visiting to help pt move the rest of his belongings out of his old house. Daughter is coming into town tomorrow for 1 week and son Linna Hoff is coming after that. CSW explained PT's concern for pt needing Rehab with supervision; likely SNF. CSW also talked to [facility] liaison who stated that they prefer pt to go to SNF if he has higher level of need versus returning to [place].  Pt does not want to go to SNF and states that his daughter and son will take care of him. He agrees to [place] contacting family. CSW calls daughter on speaker phone; attempts to add son but he doesn't answer. CSW, pt, and daughter Maudie Mercury discuss situation. Daughter states she would be unable to take care of pt 24/7 as she is just visiting temporarily and he has higher needs than she can manage. Pt is upset about this but daughter is able to talk with him about limited options. He is agreeable to SNF at [facility]. Pt is vaccinated x3.  CSW contacted [employer] with [employer]. They can take pt at [facility] tomorrow. CSW will complete FL2 and send in hub.   Expected Discharge Plan: Skilled Nursing Facility Barriers to Discharge: No SNF bed   Patient Goals and CMS Choice Patient states their goals for this hospitalization and ongoing recovery are:: Pt wants to return and have his daughter take care of him; since she is unable to he is agreeable to SNF CMS Medicare.gov Compare Post Acute Care list provided to:: Patient Choice offered to / list presented to : Patient  Expected Discharge Plan and Services Expected Discharge Plan: [place] Choice: [place] Living arrangements for the past 2 months: [place] Expected Discharge Date: 12/03/20                                    Prior Living Arrangements/Services Living arrangements for the past 2 months: [place] Lives with:: Self Patient language and need for interpreter reviewed:: Yes Do you feel safe going back to the place where you live?: Yes      Need for Family Participation in Patient Care: Yes (Comment) Care giver support system in place?: No (comment)   Criminal Activity/Legal Involvement Pertinent to Current Situation/Hospitalization: No - Comment as needed  Activities of Daily Living      Permission Sought/Granted   Permission granted to share information with : Yes, Verbal Permission Granted  Share Information with NAME: [place], [place] Daughter [phone]    and son Linna Hoff           Emotional Assessment Appearance:: Appears stated age Attitude/Demeanor/Rapport: Engaged, Apprehensive Affect (typically observed): Defensive, Appropriate Orientation: : Oriented to Self, Oriented to Place, Oriented to  Time, Oriented to Situation Alcohol / Substance Use: Not Applicable Psych Involvement: No (comment)  Admission diagnosis:  Severe anemia [D64.9] Symptomatic anemia [D64.9] Acute low  back pain, unspecified back pain laterality, unspecified whether sciatica present [M54.50] Patient Active Problem List   Diagnosis Date Noted   Symptomatic anemia 12/02/2020   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   Orthostatic hypotension 11/23/2020   Acute renal insufficiency 10/23/2020   Alcohol abuse 10/23/2020   Hypokalemia 10/23/2020   Hypomagnesemia 10/23/2020   Low back pain at multiple sites 10/23/2020   Malnutrition of mild degree Altamease Oiler: 75% to less than 90% of standard weight) ([place]) 10/23/2020   Open wound of scalp without  complication 40/98/1191   Oral candidiasis 10/23/2020   Cellulitis of left foot    Cellulitis of toe of left foot 10/13/2020   Skin ulcer of left great toe ([place]) 10/13/2020   CKD (chronic kidney disease) stage 4, 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/13/2020   Chronic heart failure with preserved ejection fraction (HFpEF) ([place]) 10/13/2020   Transient weakness of left lower extremity 09/07/2020   Heme positive stool 05/14/2020   Abnormality of gait 04/27/2020   Salmonella food poisoning 47/82/9562   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[place]) 03/17/2020   Yeast infection 03/16/2020   Diarrhea 03/16/2020   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 (HCC)    Allergies    Anemia, chronic disease    History of hypertension    Debility    Syncope and collapse 02/22/2020   AKI (acute kidney injury) ([place]) 02/10/2020   Unspecified protein-calorie malnutrition ([place]) 02/10/2020   Weakness 02/06/2020   History of repair of hip joint 10/15/2019   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   Pain in right foot 06/27/2019   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   Bradycardia 04/20/2019   Chronic diastolic heart failure ([place]) 03/14/2019   Atrial fibrillation with rapid ventricular response ([place])    Gastrointestinal hemorrhage 02/25/2019   Fall at home, initial encounter 02/25/2019   Bilateral lower extremity edema 02/25/2019   Hyponatremia 02/25/2019   Fall 02/25/2019   Benign prostatic hyperplasia with lower urinary tract symptoms 12/19/2018   Iron deficiency anemia due to chronic blood loss 08/11/2018   Anemia 08/11/2018   Hardening of the aorta (main artery of the heart) ([place]) 08/11/2018   Benign neoplasm of colon 06/18/2018   History of adenomatous polyp of colon 08/10/2017   PAF (paroxysmal atrial fibrillation) ([place]) 02/05/2017   Impaired fasting glucose 06/10/2016   Encounter for general adult medical examination without abnormal findings 06/03/2016   Posterior dislocation of hip,  closed ([place]) 04/30/2016   Hip fracture ([place]) 03/30/2016   Hypertension 03/30/2016   Hyperlipidemia 03/30/2016   Osteoarthritis 03/30/2016   Depressive disorder 03/30/2016   Rhabdomyolysis 03/30/2016   Leukocytosis 03/30/2016   Pressure ulcer 03/30/2016   Fracture of hip ([place]) 03/30/2016   Gastroesophageal reflux disease 12/17/2015   Paroxysmal atrial fibrillation ([place]) 12/17/2015   Hypertensive disorder 12/17/2015   Long term (current) use of anticoagulants 12/17/2015   Moderate major depression, single episode ([place]) 12/17/2015   Pure hypercholesterolemia 12/17/2015   Seasonal allergic rhinitis 12/17/2015   CAD (coronary artery disease) 08/21/2015   New onset atrial fibrillation ([place]) 11/04/2014   PCP:  [name], MD Pharmacy:   [facility], [place] [place] [address] Phone: [phone] Fax: [phone]     Social Determinants of Health (SDOH) Interventions    Readmission Risk Interventions Readmission Risk Prevention Plan 03/18/2020 03/05/2020 04/23/2019  Transportation Screening Complete Complete Complete  PCP or Specialist Appt within 5-7 Days - -  Complete  Home Care Screening - - Complete  Medication Review (RN CM) - - Complete  HRI or Home Care Consult Complete Complete -  Social Work Consult for [place] Planning/Counseling Complete Complete -  Palliative Care Screening Not Applicable Not Applicable -  Medication Review (RN Care Manager) Complete Complete -  Some recent data might be hidden

## 2020-12-03 NOTE — Consult Note (Signed)
[facility] Nurse Consult Note: Patient presents to ED for uncontrolled back pain.  HE has nonhealing wounds to left great toe, plantar surface and left dorsal foot.  At last podiatry consult, MD had implemented compression along with skin substitute grafting in office.  Will implement moist healing and patient will follow up outpatient for ongoing grafting and management.  Vascular evaluation was also recommended at last podiatry visit.  MD mentions ABI, but results are not visible in chart.  However, he has been safe to compress up to this point.  Reason for Consult: chronic nonhealing wounds to left foot/hallux.  Wound type: venous/CHF/edema, history cellulitis Pressure Injury POA: NA Measurement: LEft plantar toe:  2 cmx 2 cm x 0.2 cm with circumferential callous present Left dorsal foot: 1.3 cm x 0.6 cm  Wound EEF:EOFHQR:  pale pink  Foot:  100% fibrin to wound bed.  Drainage (amount, consistency, odor) minimal serosanguinous   Periwound: edema to lower legs  Dressing procedure/placement/frequency: Cleanse bilateral lower legs with NS and pat dry. Apply Xeroform to open wounds on left foot.  Ortho tech to wrap in [employer] twice weekly.  Will not follow at this time.  Please re-consult if needed.  Domenic Moras MSN, RN, FNP-BC CWON Wound, Ostomy, Continence Nurse Pager [phone]

## 2020-12-03 NOTE — Progress Notes (Signed)
Attempted to call ortho tech regarding application of unna boot. Left a voice message at this time.

## 2020-12-03 NOTE — Discharge Summary (Addendum)
Physician Discharge Summary  Darryl Diaz YSA:[id number] DOB: 10/30/1940 DOA: 12/02/2020  PCP: [name], MD  Admit date: 12/02/2020 Discharge date: 12/04/2020  Admitted From: ILF Disposition: SNF  Recommendations for Outpatient Follow-up:  Follow up with PCP in 1 week with repeat CBC/BMP Outpatient follow-up with podiatry, nephrology and cardiology Follow up in ED if symptoms worsen or new appear   Home Health: No Equipment/Devices: None  Discharge Condition: Stable CODE STATUS: Full Diet recommendation: Heart healthy  Brief/Interim Summary: 80 y.o. male with medical history significant of afib on AC; OSA not on CPAP; CAD s/p cardiac arrest; HTN; HLD; chronic diastolic CHF; and ETOH dependence in remission presenting with back pain.  On presentation, hemoglobin was 6; baseline was 8.  No history of black or bloody stools.  Patient was transfused 2 unit packed red cells during the hospitalizing.  Subsequent hemoglobin is above 8.  He feels better and is okay to be discharged back to independent living facility.  He will be discharged back today after PT evaluation with outpatient follow-up with PCP/podiatry/nephrology and cardiology.  Addendum on 12/04/2020: Patient was supposed to be discharged back to ILF on 12/03/2020 but PT recommended SNF placement.  He is currently medically stable for discharge.  He will be discharged to SNF once bed is available.  Discharge Diagnoses:   Anemia of chronic disease -Possibly from renal disease.  Hemoglobin 6 on presentation; baseline around 8. -No signs of overt GI bleeding.  FOBT negative.  No black or bloody bowel movements reported. -Status post 2 units packed red cells transfusion on admission.  Hemoglobin 8.3 today. -Feels much better.  Discharge patient back to ILF today.  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  Follows up with Dr. Barbaraann Boys.  Patient does not appear  to be a good candidate for HD and also does not want dialysis -Creatinine recently slightly worse.  Creatinine 3.48 today.  Outpatient follow-up with nephrology. -Continue torsemide  Chronic diastolic CHF -Preserved EF on 02/23/2020 echo -Continue torsemide.  Outpatient follow-up with cardiology.  Continue diet and fluid restrictions  Chronic stasis dermatitis -Wound care recommends Unna boots to be changed twice a week.  Outpatient follow-up.  Nonhealing wounds to left great toe, plantar surface and left dorsal foot: Present on admission -Patient states that wounds are improving with podiatry follow-up.  Wound care as per wound care consult recommendations.  Outpatient follow-up with podiatry.  Back pain -Unremarkable imaging at recent ER visit.  Continue Robaxin. -PT evaluation pending  Chronic A. Fib -Currently rate controlled.  Continue amiodarone.  Eliquis dose has been decreased because of renal function  Hypertension -Continue Norvasc  Hyperlipidemia -Continue statin  Mood disorder -continue Lexapro and trazodone.  Discharge Instructions  Discharge Instructions     Diet - low sodium heart healthy   Complete by: As directed    Discharge wound care:   Complete by: As directed    Cleanse bilateral lower legs with NS and pat dry. Apply Xeroform to open wounds on left foot.  Ortho tech to wrap in [employer] twice weekly.   Increase activity slowly   Complete by: As directed       Allergies as of 12/03/2020       Reactions   Penicillin G Sodium    Other reaction(s): rash   Penicillins Other (See Comments)   Tolerated Cephalosporin Date: 10/15/19. Allergy is from childhood Did it involve swelling of the face/tongue/throat, SOB, or low BP? Unknown Did it involve  sudden or severe rash/hives, skin peeling, or any reaction on the inside of your mouth or nose? Unknown Did you need to seek medical attention at a hospital or doctor's office? Unknown When did it last  happen?    childhood reaction   If all above answers are "NO", may proceed with cephalosporin use.   Penicillins         Medication List     STOP taking these medications    HYDROcodone-acetaminophen 5-325 MG tablet Commonly known as: NORCO/VICODIN       TAKE these medications    acetaminophen 325 MG tablet Commonly known as: TYLENOL Take 2 tablets (650 mg total) by mouth every 4 (four) hours as needed for headache or mild pain.   amiodarone 200 MG tablet Commonly known as: PACERONE Take 1 tablet (200 mg total) by mouth daily.   amLODipine 5 MG tablet Commonly known as: NORVASC Take 1 tablet (5 mg total) by mouth daily.   apixaban 2.5 MG Tabs tablet Commonly known as: ELIQUIS Take 1 tablet (2.5 mg total) by mouth 2 (two) times daily. What changed:  medication strength how much to take   atorvastatin 20 MG tablet Commonly known as: LIPITOR Take 1 tablet (20 mg total) by mouth daily.   Centrum Silver 50+Men Tabs Take 1 tablet by mouth daily with breakfast.   escitalopram 10 MG tablet Commonly known as: LEXAPRO Take 1 tablet (10 mg total) by mouth daily.   ferrous sulfate 325 (65 FE) MG tablet Take 1 tablet (325 mg total) by mouth daily.   folic acid 1 MG tablet Commonly known as: FOLVITE Take 1 tablet (1 mg total) by mouth daily.   ketoconazole 2 % cream Commonly known as: NIZORAL Apply 1 application topically daily. To dry scaly skin on feet   loratadine 10 MG tablet Commonly known as: CLARITIN Take 1 tablet (10 mg total) by mouth daily.   Magnesium Oxide 400 MG Caps Take 1 capsule (400 mg total) by mouth daily.   methocarbamol 500 MG tablet Commonly known as: ROBAXIN Take 1 tablet (500 mg total) by mouth every 6 (six) hours as needed for muscle spasms.   nitroGLYCERIN 0.4 MG SL tablet Commonly known as: NITROSTAT Place 1 tablet (0.4 mg total) under the tongue every 5 (five) minutes x 3 doses as needed for chest pain.   pantoprazole 40 MG  tablet Commonly known as: PROTONIX Take 1 tablet (40 mg total) by mouth daily.   potassium chloride SA 20 MEQ tablet Commonly known as: KLOR-CON Take 1 tablet (20 mEq total) by mouth daily.   torsemide 20 MG tablet Commonly known as: DEMADEX Take 60 mg by mouth See admin instructions. TAKE 3 TABLETS BY MOUTH EVERY MORNING, MAY TAKE A SECOND DOSE OF 3 TABLETS IN THE AFTERNOON AS NEEDED FOR SWELLING   traZODone 50 MG tablet Commonly known as: DESYREL Take 1 tablet (50 mg total) by mouth at bedtime.               Discharge Care Instructions  (From admission, onward)           Start     Ordered   12/03/20 0000  Discharge wound care:       Comments: Cleanse bilateral lower legs with NS and pat dry. Apply Xeroform to open wounds on left foot.  Ortho tech to wrap in [employer] twice weekly.   12/03/20 0939            Follow-up Information  Tisovec, [name], MD. Schedule an appointment as soon as possible for a visit in 1 week(s).   Specialty: Internal Medicine Why: With repeat CBC/BMP Contact information: [place] [address] [phone]         Sherren Mocha, MD .   Specialty: Cardiology Contact information: [address] [place] [address] [phone]         Deboraha Sprang, MD .   Specialty: Cardiology Contact information: [phone] [address] [place] [address] [phone]         Felipa Furnace, DPM. Schedule an appointment as soon as possible for a visit in 1 week(s).   Specialty: Podiatry Contact information: [address] [place] [address] [phone]         Rexene Agent, MD. Schedule an appointment as soon as possible for a visit in 1 week(s).   Specialty: Nephrology Contact information: [place] [address] [phone]                Allergies  Allergen Reactions   Penicillin G Sodium     Other reaction(s): rash    Penicillins Other (See Comments)    Tolerated Cephalosporin Date: 10/15/19. Allergy is from childhood Did it involve swelling of the face/tongue/throat, SOB, or low BP? Unknown Did it involve sudden or severe rash/hives, skin peeling, or any reaction on the inside of your mouth or nose? Unknown Did you need to seek medical attention at a hospital or doctor's office? Unknown When did it last happen?    childhood reaction   If all above answers are "NO", may proceed with cephalosporin use.   Penicillins     Consultations: None   Procedures/Studies: DG Lumbar Spine Complete  Result Date: 11/25/2020 CLINICAL DATA:  Acute lower back pain. EXAM: LUMBAR SPINE - COMPLETE 4+ VIEW COMPARISON:  None. FINDINGS: Minimal grade 1 retrolisthesis is noted at L2-3 secondary to degenerative disc disease at this level. Severe degenerative disc disease is also noted at L1-2, L3-4, L4-5 and L5-S1. No fracture is noted. IMPRESSION: Severe multilevel degenerative disc disease. No acute abnormality seen. Aortic Atherosclerosis (ICD10-I70.0). Electronically Signed   By: Marijo Conception M.D.   On: 11/25/2020 08:18   DG HIP UNILAT WITH PELVIS 2-3 VIEWS RIGHT  Result Date: 11/25/2020 CLINICAL DATA:  Acute right hip pain. EXAM: DG HIP (WITH OR WITHOUT PELVIS) 2-3V RIGHT COMPARISON:  None. FINDINGS: Status post right total hip arthroplasty. No fracture or dislocation is noted. IMPRESSION: No acute abnormality seen. Electronically Signed   By: Marijo Conception M.D.   On: 11/25/2020 08:20      Subjective: Patient seen and examined at bedside.  He feels better and thinks that he is ready to go back to his facility today.  Denies overnight fever, vomiting, black or bloody stools.  Discharge Exam: Vitals:   12/03/20 0505 12/03/20 0854  BP: (!) 164/76 (!) 159/70  Pulse: 91 93  Resp: 16 18  Temp: 98 F (36.7 C)   SpO2: 97% 97%    General: Pt is alert, awake, not in acute distress.  Elderly male lying in bed.  On  room air currently. Cardiovascular: rate controlled, S1/S2 + Respiratory: bilateral decreased breath sounds at bases with some scattered crackles Abdominal: Soft, NT, ND, bowel sounds + Extremities: Trace lower extremity edema with chronic bilateral lower extremity stasis dermatitis; no cyanosis    The results of significant diagnostics from this hospitalization (including imaging,  microbiology, ancillary and laboratory) are listed below for reference.     Microbiology: No results found for this or any previous visit (from the past 240 hour(s)).   Labs: BNP (last 3 results) Recent Labs    02/06/20 0916 02/22/20 1433 09/08/20 1953  BNP 381.9* 266.2* 427.6*   Basic Metabolic Panel: Recent Labs  Lab 12/02/20 1046 12/03/20 0413  NA 132* 136  K 4.8 4.0  CL 98 103  CO2 23 24  GLUCOSE 90 87  BUN 38* 38*  CREATININE 3.44* 3.48*  CALCIUM 8.6* 8.7*   Liver Function Tests: No results for input(s): AST, ALT, ALKPHOS, BILITOT, PROT, ALBUMIN in the last 168 hours. No results for input(s): LIPASE, AMYLASE in the last 168 hours. No results for input(s): AMMONIA in the last 168 hours. CBC: Recent Labs  Lab 12/02/20 1046 12/03/20 0413  WBC 7.0 6.8  NEUTROABS 5.7  --   HGB 6.0* 8.3*  HCT 18.8* 24.1*  MCV 102.2* 97.6  PLT 271 250   Cardiac Enzymes: No results for input(s): CKTOTAL, CKMB, CKMBINDEX, TROPONINI in the last 168 hours. BNP: Invalid input(s): POCBNP CBG: No results for input(s): GLUCAP in the last 168 hours. D-Dimer No results for input(s): DDIMER in the last 72 hours. Hgb A1c No results for input(s): HGBA1C in the last 72 hours. Lipid Profile No results for input(s): CHOL, HDL, LDLCALC, TRIG, CHOLHDL, LDLDIRECT in the last 72 hours. Thyroid function studies No results for input(s): TSH, T4TOTAL, T3FREE, THYROIDAB in the last 72 hours.  Invalid input(s): FREET3 Anemia work up No results for input(s): VITAMINB12, FOLATE, FERRITIN, TIBC, IRON, RETICCTPCT in  the last 72 hours. Urinalysis    Component Value Date/Time   COLORURINE YELLOW 12/02/2020 1206   APPEARANCEUR CLEAR 12/02/2020 1206   LABSPEC 1.009 12/02/2020 1206   PHURINE 5.0 12/02/2020 1206   GLUCOSEU NEGATIVE 12/02/2020 1206   HGBUR LARGE (A) 12/02/2020 1206   BILIRUBINUR NEGATIVE 12/02/2020 1206   KETONESUR NEGATIVE 12/02/2020 1206   PROTEINUR 100 (A) 12/02/2020 1206   NITRITE NEGATIVE 12/02/2020 1206   LEUKOCYTESUR NEGATIVE 12/02/2020 1206   Sepsis Labs Invalid input(s): PROCALCITONIN,  WBC,  LACTICIDVEN Microbiology No results found for this or any previous visit (from the past 240 hour(s)).   Time coordinating discharge: 35 minutes  SIGNED:   Aline August, MD  [facility] 12/03/2020, 9:40 AM

## 2020-12-03 NOTE — Progress Notes (Signed)
       RE:   Darryl Diaz     Date of Birth:  Jun 08, 1940    Date:  12/03/2020      To Whom It May Concern:  Please be advised that the above-named patient will require a short-term nursing home stay - anticipated 30 days or less for rehabilitation and strengthening.  The plan is for return home.    Date

## 2020-12-03 NOTE — Progress Notes (Addendum)
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[facility])    Cardiac arrest ([facility]) 04/20/2019   12 min CPR with epi   Chronic diastolic CHF (congestive heart failure) ([facility])    Fatty liver    Gastritis    GERD (gastroesophageal reflux disease)    H/O seasonal allergies    History of GI bleed    Hx of adenomatous colonic polyps 08/10/2017   Hyperlipidemia    Hypertension    Insomnia    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   Mallory-Weiss tear    OA (osteoarthritis)    hands   OSA (obstructive sleep apnea)    No longer uses CPAP   Persistent atrial fibrillation with rapid ventricular response ([facility]) 02/05/2020   Recurrent syncope    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(HCC)     Medications:  Scheduled Meds:  amiodarone  200 mg Oral Daily   amLODipine  5 mg Oral Daily   apixaban  2.5 mg Oral BID   atorvastatin  20 mg Oral Daily   escitalopram  10 mg Oral Daily   ferrous sulfate  325 mg Oral BID WC   folic acid  1 mg Oral Daily   loratadine  10 mg Oral Daily   magnesium oxide  400 mg Oral Daily   multivitamin with minerals  1 tablet Oral Daily   pantoprazole  40 mg Oral Daily   torsemide  60 mg Oral Daily   traZODone  50 mg Oral QHS   Continuous Infusions:  Labs: Recent Labs  Lab 12/02/20 1046 12/03/20 0413  NA 132* 136  K 4.8 4.0  CL 98 103  CO2 23 24  BUN 38* 38*  CREATININE 3.44* 3.48*  CALCIUM 8.6* 8.7*  GLUCOSE 90 87    Wt Readings from Last 15 Encounters:  12/03/20 108.9 kg  10/07/20 113.4 kg  09/26/20 108.9 kg  08/03/20 106.1 kg  07/28/20 105.1 kg  07/08/20 110.2 kg  06/16/20 103 kg  06/03/20 103.1 kg  05/14/20 102.2 kg   04/27/20 104.3 kg  04/10/20 105.7 kg  03/20/20 101 kg  03/11/20 102.5 kg  03/05/20 101.3 kg  02/18/20 107.9 kg    Body mass index is 32.55 kg/m. Patient meets criteria for obesity based on current BMI.   Current diet order is renal, patient is consuming approximately 100% of meals at this time.   Discussed pt with RN. Pt with discharge orders signed, pending bed availability.  No nutrition interventions warranted at this time. If nutrition issues arise, please consult RD.   Larkin Ina, MS, RD, LDN (she/her/hers) RD pager number and weekend/on-call pager number located in [place].

## 2020-12-04 ENCOUNTER — Ambulatory Visit: Payer: Medicare Other | Admitting: Podiatry

## 2020-12-04 DIAGNOSIS — I10 Essential (primary) hypertension: Secondary | ICD-10-CM | POA: Diagnosis not present

## 2020-12-04 DIAGNOSIS — Z20822 Contact with and (suspected) exposure to covid-19: Secondary | ICD-10-CM | POA: Diagnosis not present

## 2020-12-04 DIAGNOSIS — D631 Anemia in chronic kidney disease: Secondary | ICD-10-CM | POA: Diagnosis not present

## 2020-12-04 DIAGNOSIS — Z96641 Presence of right artificial hip joint: Secondary | ICD-10-CM | POA: Diagnosis not present

## 2020-12-04 DIAGNOSIS — L309 Dermatitis, unspecified: Secondary | ICD-10-CM | POA: Diagnosis not present

## 2020-12-04 DIAGNOSIS — E785 Hyperlipidemia, unspecified: Secondary | ICD-10-CM | POA: Diagnosis not present

## 2020-12-04 DIAGNOSIS — Z7401 Bed confinement status: Secondary | ICD-10-CM | POA: Diagnosis not present

## 2020-12-04 DIAGNOSIS — F39 Unspecified mood [affective] disorder: Secondary | ICD-10-CM | POA: Diagnosis not present

## 2020-12-04 DIAGNOSIS — I482 Chronic atrial fibrillation, unspecified: Secondary | ICD-10-CM | POA: Diagnosis not present

## 2020-12-04 DIAGNOSIS — I5032 Chronic diastolic (congestive) heart failure: Secondary | ICD-10-CM | POA: Diagnosis not present

## 2020-12-04 DIAGNOSIS — I130 Hypertensive heart and chronic kidney disease with heart failure and stage 1 through stage 4 chronic kidney disease, or unspecified chronic kidney disease: Secondary | ICD-10-CM | POA: Diagnosis not present

## 2020-12-04 DIAGNOSIS — N19 Unspecified kidney failure: Secondary | ICD-10-CM | POA: Diagnosis not present

## 2020-12-04 DIAGNOSIS — M2550 Pain in unspecified joint: Secondary | ICD-10-CM | POA: Diagnosis not present

## 2020-12-04 DIAGNOSIS — S91302A Unspecified open wound, left foot, initial encounter: Secondary | ICD-10-CM | POA: Diagnosis not present

## 2020-12-04 DIAGNOSIS — N184 Chronic kidney disease, stage 4 (severe): Secondary | ICD-10-CM | POA: Diagnosis not present

## 2020-12-04 DIAGNOSIS — D649 Anemia, unspecified: Secondary | ICD-10-CM | POA: Diagnosis not present

## 2020-12-04 DIAGNOSIS — I5042 Chronic combined systolic (congestive) and diastolic (congestive) heart failure: Secondary | ICD-10-CM | POA: Diagnosis not present

## 2020-12-04 DIAGNOSIS — I4891 Unspecified atrial fibrillation: Secondary | ICD-10-CM | POA: Diagnosis not present

## 2020-12-04 DIAGNOSIS — J309 Allergic rhinitis, unspecified: Secondary | ICD-10-CM | POA: Diagnosis not present

## 2020-12-04 NOTE — Progress Notes (Signed)
Dressing to the left great toe changed. Ortho tech in the room to apply unna boot.

## 2020-12-04 NOTE — Plan of Care (Signed)
Report called to Lillia Corporal at [facility]. Questions and concerns answered. Patient's IV discontinued. Patient discharged with PTAR. Patient hemodynamically stable during discharge.  Problem: Education: Goal: Knowledge of General Education information will improve Description: Including pain rating scale, medication(s)/side effects and non-pharmacologic comfort measures Outcome: Adequate for Discharge   Problem: Health Behavior/Discharge Planning: Goal: Ability to manage health-related needs will improve Outcome: Adequate for Discharge   Problem: Clinical Measurements: Goal: Ability to maintain clinical measurements within normal limits will improve Outcome: Adequate for Discharge Goal: Will remain free from infection Outcome: Adequate for Discharge Goal: Diagnostic test results will improve Outcome: Adequate for Discharge Goal: Respiratory complications will improve Outcome: Adequate for Discharge Goal: Cardiovascular complication will be avoided Outcome: Adequate for Discharge   Problem: Activity: Goal: Risk for activity intolerance will decrease Outcome: Adequate for Discharge   Problem: Nutrition: Goal: Adequate nutrition will be maintained Outcome: Adequate for Discharge   Problem: Coping: Goal: Level of anxiety will decrease Outcome: Adequate for Discharge   Problem: Elimination: Goal: Will not experience complications related to bowel motility Outcome: Adequate for Discharge Goal: Will not experience complications related to urinary retention Outcome: Adequate for Discharge   Problem: Pain Managment: Goal: General experience of comfort will improve Outcome: Adequate for Discharge   Problem: Safety: Goal: Ability to remain free from injury will improve Outcome: Adequate for Discharge   Problem: Skin Integrity: Goal: Risk for impaired skin integrity will decrease Outcome: Adequate for Discharge

## 2020-12-04 NOTE — Progress Notes (Signed)
Orthopedic Tech Progress Note Patient Details:  Darryl Diaz [birth year] [id number]  Ortho Devices Type of Ortho Device: Louretta Parma boot Ortho Device/Splint Location: Bi LE Ortho Device/Splint Interventions: Application   Post Interventions Patient Tolerated: Well  Darryl Diaz Darryl Diaz 12/04/2020, 8:55 AM

## 2020-12-04 NOTE — TOC Transition Note (Signed)
Transition of Care [facility]) - CM/SW Discharge Note   Patient Details  Name: Darryl Diaz MRN: [id number] Date of Birth: [birth year]  Transition of Care [facility]) CM/SW Contact:  Bethann Berkshire, [place] Phone Number: 12/04/2020, 10:35 AM   Clinical Narrative:     Patient will DC to: [facility] Anticipated DC date: 12/04/20 Family notified:  [place], [place] Daughter [phone]    Transport by: Corey Harold   Per MD patient ready for DC to [facility] SNF. RN, patient, patient's family, and facility notified of DC. Discharge Summary and FL2 sent to facility. RN to call report prior to discharge ([phone] Room 405). DC packet on chart. Ambulance transport requested for patient.   CSW will sign off for now as social work intervention is no longer needed. Please consult [place] again if new needs arise.   Final next level of care: Skilled Nursing Facility Barriers to Discharge: No Barriers Identified   Patient Goals and CMS Choice Patient states their goals for this hospitalization and ongoing recovery are:: Pt wants to return and have his daughter take care of him; since she is unable to he is agreeable to SNF CMS Medicare.gov Compare Post Acute Care list provided to:: Patient Choice offered to / list presented to : Patient  Discharge Placement              Patient chooses bed at: [facility] Patient to be transferred to facility by: [place] Name of family member notified: Daxter, [place] Daughter [phone] Patient and family notified of of transfer: 12/04/20  Discharge Plan and Services     Post Acute Care Choice: [facility]                               Social Determinants of Health (SDOH) Interventions     Readmission Risk Interventions Readmission Risk Prevention Plan 03/18/2020 03/05/2020 04/23/2019  Transportation Screening Complete Complete Complete  PCP or Specialist Appt within 5-7 Days - - Complete  Home Care Screening - - Complete  Medication  Review (RN CM) - - Complete  HRI or Home Care Consult Complete Complete -  Social Work Consult for [place] Planning/Counseling Complete Complete -  Palliative Care Screening Not Applicable Not Applicable -  Medication Review Press photographer) Complete Complete -  Some recent data might be hidden

## 2020-12-04 NOTE — Progress Notes (Signed)
Patient ID: Darryl Diaz, male   DOB: [birth year], 80 y.o.   MRN: [id number] Patient was supposed to be discharged back to ILF on 12/03/2020 but PT recommended SNF placement.  He is currently medically stable for discharge.  Patient seen and examined at bedside.  He will be discharged to SNF once bed is available.  Please refer to the full discharge summary done by me on 12/03/2020 for full details.

## 2020-12-04 NOTE — Progress Notes (Signed)
Attempted to call report at [facility]. Per staff nurse will call back for report. Provided with the number to call back.

## 2020-12-08 DIAGNOSIS — S91302A Unspecified open wound, left foot, initial encounter: Secondary | ICD-10-CM | POA: Diagnosis not present

## 2020-12-08 DIAGNOSIS — I5032 Chronic diastolic (congestive) heart failure: Secondary | ICD-10-CM | POA: Diagnosis not present

## 2020-12-08 DIAGNOSIS — D649 Anemia, unspecified: Secondary | ICD-10-CM | POA: Diagnosis not present

## 2020-12-08 DIAGNOSIS — N184 Chronic kidney disease, stage 4 (severe): Secondary | ICD-10-CM | POA: Diagnosis not present

## 2020-12-08 DIAGNOSIS — I10 Essential (primary) hypertension: Secondary | ICD-10-CM | POA: Diagnosis not present

## 2020-12-10 ENCOUNTER — Telehealth: Payer: Self-pay | Admitting: Cardiovascular Disease

## 2020-12-10 NOTE — Telephone Encounter (Signed)
   Barbara with [facility] calling, she said pt needs a sooner appt for hospital f/u, she also said pt is in afib and his side chest hurting.

## 2020-12-10 NOTE — Telephone Encounter (Signed)
Returned call and left word for [name] that the appointment is as early as is available .Adonis Housekeeper

## 2020-12-15 ENCOUNTER — Other Ambulatory Visit: Payer: Self-pay | Admitting: Podiatry

## 2020-12-17 DIAGNOSIS — I482 Chronic atrial fibrillation, unspecified: Secondary | ICD-10-CM | POA: Diagnosis not present

## 2020-12-17 DIAGNOSIS — D649 Anemia, unspecified: Secondary | ICD-10-CM | POA: Diagnosis not present

## 2020-12-17 DIAGNOSIS — E785 Hyperlipidemia, unspecified: Secondary | ICD-10-CM | POA: Diagnosis not present

## 2020-12-17 DIAGNOSIS — I10 Essential (primary) hypertension: Secondary | ICD-10-CM | POA: Diagnosis not present

## 2020-12-17 DIAGNOSIS — I5042 Chronic combined systolic (congestive) and diastolic (congestive) heart failure: Secondary | ICD-10-CM | POA: Diagnosis not present

## 2020-12-18 ENCOUNTER — Ambulatory Visit: Payer: Medicare Other | Admitting: Podiatry

## 2020-12-21 DIAGNOSIS — M6281 Muscle weakness (generalized): Secondary | ICD-10-CM | POA: Diagnosis not present

## 2020-12-21 DIAGNOSIS — R278 Other lack of coordination: Secondary | ICD-10-CM | POA: Diagnosis not present

## 2020-12-21 DIAGNOSIS — D649 Anemia, unspecified: Secondary | ICD-10-CM | POA: Diagnosis not present

## 2020-12-21 DIAGNOSIS — I482 Chronic atrial fibrillation, unspecified: Secondary | ICD-10-CM | POA: Diagnosis not present

## 2020-12-25 DIAGNOSIS — R278 Other lack of coordination: Secondary | ICD-10-CM | POA: Diagnosis not present

## 2020-12-25 DIAGNOSIS — D649 Anemia, unspecified: Secondary | ICD-10-CM | POA: Diagnosis not present

## 2020-12-25 DIAGNOSIS — I482 Chronic atrial fibrillation, unspecified: Secondary | ICD-10-CM | POA: Diagnosis not present

## 2020-12-25 DIAGNOSIS — M6281 Muscle weakness (generalized): Secondary | ICD-10-CM | POA: Diagnosis not present

## 2020-12-30 ENCOUNTER — Ambulatory Visit (INDEPENDENT_AMBULATORY_CARE_PROVIDER_SITE_OTHER): Payer: Medicare Other | Admitting: Podiatry

## 2020-12-30 ENCOUNTER — Ambulatory Visit: Payer: Medicare Other | Admitting: Podiatry

## 2020-12-30 ENCOUNTER — Other Ambulatory Visit: Payer: Self-pay

## 2020-12-30 ENCOUNTER — Encounter: Payer: Self-pay | Admitting: Podiatry

## 2020-12-30 DIAGNOSIS — D500 Iron deficiency anemia secondary to blood loss (chronic): Secondary | ICD-10-CM | POA: Diagnosis not present

## 2020-12-30 DIAGNOSIS — L97512 Non-pressure chronic ulcer of other part of right foot with fat layer exposed: Secondary | ICD-10-CM

## 2020-12-30 DIAGNOSIS — E7800 Pure hypercholesterolemia, unspecified: Secondary | ICD-10-CM | POA: Diagnosis not present

## 2020-12-30 DIAGNOSIS — F321 Major depressive disorder, single episode, moderate: Secondary | ICD-10-CM | POA: Diagnosis not present

## 2020-12-30 DIAGNOSIS — L97522 Non-pressure chronic ulcer of other part of left foot with fat layer exposed: Secondary | ICD-10-CM

## 2020-12-30 DIAGNOSIS — E11621 Type 2 diabetes mellitus with foot ulcer: Secondary | ICD-10-CM | POA: Diagnosis not present

## 2020-12-30 DIAGNOSIS — I4891 Unspecified atrial fibrillation: Secondary | ICD-10-CM | POA: Diagnosis not present

## 2020-12-30 DIAGNOSIS — I5032 Chronic diastolic (congestive) heart failure: Secondary | ICD-10-CM | POA: Diagnosis not present

## 2020-12-30 DIAGNOSIS — I10 Essential (primary) hypertension: Secondary | ICD-10-CM | POA: Diagnosis not present

## 2020-12-30 DIAGNOSIS — R7301 Impaired fasting glucose: Secondary | ICD-10-CM | POA: Diagnosis not present

## 2020-12-30 DIAGNOSIS — I700 Atherosclerosis of aorta: Secondary | ICD-10-CM | POA: Diagnosis not present

## 2020-12-30 DIAGNOSIS — Z7901 Long term (current) use of anticoagulants: Secondary | ICD-10-CM | POA: Diagnosis not present

## 2020-12-30 DIAGNOSIS — I999 Unspecified disorder of circulatory system: Secondary | ICD-10-CM | POA: Diagnosis not present

## 2020-12-30 DIAGNOSIS — K219 Gastro-esophageal reflux disease without esophagitis: Secondary | ICD-10-CM | POA: Diagnosis not present

## 2020-12-30 DIAGNOSIS — E441 Mild protein-calorie malnutrition: Secondary | ICD-10-CM | POA: Diagnosis not present

## 2020-12-30 NOTE — Progress Notes (Signed)
Subjective:  Patient ID: Darryl Diaz, male    DOB: 1941/02/01,  MRN: [id number]  Chief Complaint  Patient presents with   Wound Check    Left hallux wound check     80 y.o. male presents for wound care.  Patient presents with complaint of left hallux ulceration and now a completely rehab at the last dorsal midfoot wound.  Patient states he was in the hospital and followed by rehab that is why he was not following up.  He was lost to follow-up.  He denies any other acute complaints.   Review of Systems: Negative except as noted in the HPI. Denies N/V/F/Ch.  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   Anemia    Ascending aorta dilation (HCC)    Cardiac arrest ([place]) 04/20/2019   12 min CPR with epi   Chronic diastolic CHF (congestive heart failure) (HCC)    Fatty liver    Gastritis    GERD (gastroesophageal reflux disease)    H/O seasonal allergies    History of GI bleed    Hx of adenomatous colonic polyps 08/10/2017   Hyperlipidemia    Hypertension    Insomnia    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   Mallory-Weiss tear    OA (osteoarthritis)    hands   OSA (obstructive sleep apnea)    No longer uses CPAP   Persistent atrial fibrillation with rapid ventricular response ([place]) 02/05/2020   Recurrent syncope    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(HCC)     Current Outpatient Medications:    acetaminophen (TYLENOL) 325 MG tablet, Take 2 tablets (650 mg total) by mouth every 4 (four) hours as needed for headache or mild pain., Disp: , Rfl:    amiodarone (PACERONE) 200 MG tablet, Take 1 tablet (200 mg total) by mouth daily., Disp: 90 tablet, Rfl: 3   amLODipine (NORVASC) 5 MG tablet, Take 1 tablet (5 mg total) by mouth daily., Disp: 30 tablet, Rfl: 0   apixaban (ELIQUIS) 2.5 MG TABS tablet, Take 1 tablet (2.5 mg total) by mouth 2 (two) times daily., Disp: 60 tablet, Rfl: 0   atorvastatin (LIPITOR) 20 MG tablet, Take  1 tablet (20 mg total) by mouth daily., Disp: 30 tablet, Rfl: 0   escitalopram (LEXAPRO) 10 MG tablet, Take 1 tablet (10 mg total) by mouth daily., Disp: 30 tablet, Rfl: 0   ferrous sulfate 325 (65 FE) MG tablet, Take 1 tablet (325 mg total) by mouth daily., Disp: , Rfl:    folic acid (FOLVITE) 1 MG tablet, Take 1 tablet (1 mg total) by mouth daily., Disp: 30 tablet, Rfl: 0   ketoconazole (NIZORAL) 2 % cream, Apply 1 application topically daily. To dry scaly skin on feet, Disp: 60 g, Rfl: 2   loratadine (CLARITIN) 10 MG tablet, Take 1 tablet (10 mg total) by mouth daily., Disp: 30 tablet, Rfl: 0   Magnesium Oxide 400 MG CAPS, Take 1 capsule (400 mg total) by mouth daily., Disp: , Rfl:    methocarbamol (ROBAXIN) 500 MG tablet, Take 1 tablet (500 mg total) by mouth every 6 (six) hours as needed for muscle spasms., Disp: 30 tablet, Rfl: 0   Multiple Vitamins-Minerals (CENTRUM SILVER 50+MEN) TABS, Take 1 tablet by mouth daily with breakfast., Disp: , Rfl:    nitroGLYCERIN (NITROSTAT) 0.4 MG SL tablet, Place 1 tablet (0.4 mg total) under the tongue every 5 (five) minutes x 3  doses as needed for chest pain., Disp: 30 tablet, Rfl: 12   pantoprazole (PROTONIX) 40 MG tablet, Take 1 tablet (40 mg total) by mouth daily., Disp: 30 tablet, Rfl: 0   potassium chloride SA (KLOR-CON) 20 MEQ tablet, Take 1 tablet (20 mEq total) by mouth daily., Disp: 90 tablet, Rfl: 3   torsemide (DEMADEX) 20 MG tablet, Take 60 mg by mouth See admin instructions. TAKE 3 TABLETS BY MOUTH EVERY MORNING, MAY TAKE A SECOND DOSE OF 3 TABLETS IN THE AFTERNOON AS NEEDED FOR SWELLING, Disp: , Rfl:    traZODone (DESYREL) 50 MG tablet, Take 1 tablet (50 mg total) by mouth at bedtime., Disp: 30 tablet, Rfl: 0 No current facility-administered medications for this visit.  Facility-Administered Medications Ordered in Other Visits:    metoprolol tartrate (LOPRESSOR) injection, , , Anesthesia Intra-op, Mariea Clonts, CRNA, 5 mg at 02/27/19  1318  Social History   Tobacco Use  Smoking Status Former   Packs/day: 1.00   Years: 20.00   Pack years: 20.00   Types: Cigarettes  Smokeless Tobacco Never  Tobacco Comments      quit 20 years  smoked on and off for 20 years    Allergies  Allergen Reactions   Penicillin G Sodium     Other reaction(s): rash   Penicillins Other (See Comments)    Tolerated Cephalosporin Date: 10/15/19. Allergy is from childhood Did it involve swelling of the face/tongue/throat, SOB, or low BP? Unknown Did it involve sudden or severe rash/hives, skin peeling, or any reaction on the inside of your mouth or nose? Unknown Did you need to seek medical attention at a hospital or doctor's office? Unknown When did it last happen?    childhood reaction   If all above answers are "NO", may proceed with cephalosporin use.   Penicillins    Objective:  There were no vitals filed for this visit. There is no height or weight on file to calculate BMI. Constitutional Well developed. Well nourished.  Vascular Dorsalis pedis pulses palpable bilaterally. Posterior tibial pulses palpable bilaterally. Capillary refill normal to all digits.  No cyanosis or clubbing noted. Pedal hair growth normal.  Neurologic Normal speech. Oriented to person, place, and time. Protective sensation absent  Dermatologic Wound Location: Left hallux ulceration with fat layer exposed.  Granular wound bed noted.  No malodor present no purulent drainage noted.  No probing down to bone noted. Wound Base: Granular/Healthy Peri-wound: Normal Exudate: Scant/small amount Serous exudate Wound Measurements: -See below  Left dorsal midfoot wound.  Completely we epithelialized.  No signs of any ulceration noted  Orthopedic: No pain to palpation either foot.   Radiographs: None Assessment:   1. Vascular abnormality   2. Diabetic ulcer of toe of right foot associated with type 2 diabetes mellitus, with fat layer exposed ([place])         Plan:  Patient was evaluated and treated and all questions answered.  Left hallux dorsal midfoot wound -Clinically healed and we epithelialized  Ulcer left dorsal midfoot wound and left hallux wound -Debridement as below. -Dressed with Betadine wet-to-dry, DSD. -Continue off-loading with surgical shoe. -ABI reviewed and given the stagnation of the wound, I believe patient will benefit from vascular intervention to improve flow. Patient agrees with the plan -We will resume graft application once the vascular flow has been optimized.  -I will reach out to Dr. Damita Dunnings to schedule this patient for intervention   Procedure: Excisional Debridement of Wound left hallux ulcer fat layer  exposed Tool: Sharp chisel blade/tissue nipper Rationale: Removal of non-viable soft tissue from the wound to promote healing.  Anesthesia: none Pre-Debridement Wound Measurements: 2.2 centimeters by 1.9 cm x 0.3 cm Post-Debridement Wound Measurements: 2.2 centimeters by 1.9 cm x 0.3 cm Type of Debridement: Sharp Excisional Tissue Removed: Non-viable soft tissue Blood loss: Minimal (<50cc) Depth of Debridement: subcutaneous tissue. Technique: Sharp excisional debridement to bleeding, viable wound base.  Wound Progress: The wound is decreasing with graft application Dressing: Dry, sterile, compression dressing. Disposition: Patient tolerated procedure well. Patient to return in 1 week for follow-up.    No follow-ups on file.

## 2020-12-31 ENCOUNTER — Other Ambulatory Visit: Payer: PRIVATE HEALTH INSURANCE

## 2020-12-31 ENCOUNTER — Ambulatory Visit
Admission: RE | Admit: 2020-12-31 | Discharge: 2020-12-31 | Disposition: A | Discharge status: Home / Self Care | Payer: Medicare Other | Source: Ambulatory Visit | Attending: Podiatry | Admitting: Podiatry

## 2020-12-31 DIAGNOSIS — E11621 Type 2 diabetes mellitus with foot ulcer: Secondary | ICD-10-CM

## 2020-12-31 DIAGNOSIS — I70245 Atherosclerosis of native arteries of left leg with ulceration of other part of foot: Secondary | ICD-10-CM | POA: Diagnosis not present

## 2020-12-31 DIAGNOSIS — L97518 Non-pressure chronic ulcer of other part of right foot with other specified severity: Secondary | ICD-10-CM | POA: Diagnosis not present

## 2020-12-31 DIAGNOSIS — L97512 Non-pressure chronic ulcer of other part of right foot with fat layer exposed: Secondary | ICD-10-CM

## 2020-12-31 HISTORY — PX: IR RADIOLOGIST EVAL & MGMT: IMG5224

## 2020-12-31 NOTE — Consult Note (Signed)
Chief Complaint: Left foot wound  Referring Physician(s): Patel,Kevin P  History of Present Illness: Darryl Diaz is a 80 y.o. male presenting as a scheduled consultation for CLI/CLTI, kindly referred by Dr. Posey Pronto, for left lower extremity wound.   Darryl Diaz has been unable to make it to our office, given his status, and we performed our visit today virtually on the telephone.  We confirmed his identity today with 2 personal identifiers.    He tells me that he has been dealing with a left foot wound on the plantar aspect of the left great toe for about 4-5 months, and receiving care from Dr. Posey Pronto for about 3 months.  By his estimation, there has not been much change, but it is difficult given that "he cannot see it."    I cannot elicit a history of claudication or rest pain.    He reports a history of prior TIA, and denies any history of MI (however I see history of cardiac arrest in history).    At home he has been using a walker to get around given the foot wound, but tells me that previously he had no restrictions and was ambulating normally.  He still stays fairly busy, but finds it difficult given his need for the walker.  He is participating with PT at his home.   CV risk factors: HLD, HTN, smoking, age.   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   Anemia    Ascending aorta dilation (HCC)    Cardiac arrest ([place]) 04/20/2019   12 min CPR with epi   Chronic diastolic CHF (congestive heart failure) ([place])    Fatty liver    Gastritis    GERD (gastroesophageal reflux disease)    H/O seasonal allergies    History of GI bleed    Hx of adenomatous colonic polyps 08/10/2017   Hyperlipidemia    Hypertension    Insomnia    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   Mallory-Weiss tear    OA  (osteoarthritis)    hands   OSA (obstructive sleep apnea)    No longer uses CPAP   Persistent atrial fibrillation with rapid ventricular response ([place]) 02/05/2020   Recurrent syncope    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[facility])     Past Surgical History:  Procedure Laterality Date   ANKLE SURGERY Left 2014   had rods put in    BIOPSY  02/28/2019   Procedure: BIOPSY;  Surgeon: Milus Banister, MD;  Location: [place];  Service: Endoscopy;;   CARDIOVERSION  02/06/2020   CARDIOVERSION N/A 02/06/2020   Procedure: CARDIOVERSION;  Surgeon: Werner Lean, MD;  Location: [place] ENDOSCOPY;  Service: Cardiovascular;  Laterality: N/A;   CARDIOVERSION N/A 02/24/2020   Procedure: CARDIOVERSION;  Surgeon: Werner Lean, MD;  Location: [place];  Service: Cardiovascular;  Laterality: N/A;   COLONOSCOPY     [place]   ESOPHAGOGASTRODUODENOSCOPY (EGD) WITH PROPOFOL N/A 02/28/2019   Procedure: ESOPHAGOGASTRODUODENOSCOPY (EGD) WITH PROPOFOL;  Surgeon: Milus Banister, MD;  Location[facility] END[facility]COPY;  Service: Endoscopy;  Laterality: N/A;   HIP ARTHROPLASTY Left 04/01/2016   Procedure: ARTHROPLASTY BIPOLAR HIP (HEMIARTHROPLASTY);  Surgeon: Paralee Cancel, MD;  Location: [place];  Service: Orthopedics;  Laterality: Left;   HIP CLOSED REDUCTION Left 04/30/2016   Procedure: CLOSED REDUCTION  HIP;  Surgeon: Wylene Simmer, MD;  Location: [place];  Service: Orthopedics;  Laterality: Left;   TONSILLECTOMY     TOTAL HIP ARTHROPLASTY Right 10/15/2019   Procedure: TOTAL HIP ARTHROPLASTY ANTERIOR APPROACH;  Surgeon: Paralee Cancel, MD;  Location: [facility];  Service: Orthopedics;  Laterality: Right;  70 mins    Allergies: Penicillin g sodium, Penicillins, and Penicillins  Medications: Prior to Admission medications   Medication Sig Start Date End Date Taking? Authorizing Provider  acetaminophen (TYLENOL) 325 MG tablet Take 2 tablets (650 mg total) by mouth every 4 (four) hours as needed for headache or mild pain.  03/10/20   Angiulli, Lavon Paganini, PA-C  amiodarone (PACERONE) 200 MG tablet Take 1 tablet (200 mg total) by mouth daily. 04/10/20   Dunn, Nedra Hai, PA-C  amLODipine (NORVASC) 5 MG tablet Take 1 tablet (5 mg total) by mouth daily. 09/14/20   Caren Griffins, MD  apixaban (ELIQUIS) 2.5 MG TABS tablet Take 1 tablet (2.5 mg total) by mouth 2 (two) times daily. 12/03/20   Aline August, MD  atorvastatin (LIPITOR) 20 MG tablet Take 1 tablet (20 mg total) by mouth daily. 03/10/20   Angiulli, Lavon Paganini, PA-C  escitalopram (LEXAPRO) 10 MG tablet Take 1 tablet (10 mg total) by mouth daily. 03/10/20   Angiulli, Lavon Paganini, PA-C  ferrous sulfate 325 (65 FE) MG tablet Take 1 tablet (325 mg total) by mouth daily. 12/03/20 01/02/21  Aline August, MD  folic acid (FOLVITE) 1 MG tablet Take 1 tablet (1 mg total) by mouth daily. 03/10/20   Angiulli, Lavon Paganini, PA-C  ketoconazole (NIZORAL) 2 % cream Apply 1 application topically daily. To dry scaly skin on feet 07/23/20   McDonald, Adam R, DPM  loratadine (CLARITIN) 10 MG tablet Take 1 tablet (10 mg total) by mouth daily. 02/12/20   Medina-Vargas, Monina C, NP  Magnesium Oxide 400 MG CAPS Take 1 capsule (400 mg total) by mouth daily. 10/17/20   Eugenie Filler, MD  methocarbamol (ROBAXIN) 500 MG tablet Take 1 tablet (500 mg total) by mouth every 6 (six) hours as needed for muscle spasms. 12/03/20   Aline August, MD  Multiple Vitamins-Minerals (CENTRUM SILVER 50+MEN) TABS Take 1 tablet by mouth daily with breakfast.    [provider]  nitroGLYCERIN (NITROSTAT) 0.4 MG SL tablet Place 1 tablet (0.4 mg total) under the tongue every 5 (five) minutes x 3 doses as needed for chest pain. 07/28/20   Baldwin Jamaica, PA-C  pantoprazole (PROTONIX) 40 MG tablet Take 1 tablet (40 mg total) by mouth daily. 03/10/20   Angiulli, Lavon Paganini, PA-C  potassium chloride SA (KLOR-CON) 20 MEQ tablet Take 1 tablet (20 mEq total) by mouth daily. 10/17/20 10/12/21  Eugenie Filler, MD  torsemide  (DEMADEX) 20 MG tablet Take 60 mg by mouth See admin instructions. TAKE 3 TABLETS BY MOUTH EVERY MORNING, MAY TAKE A SECOND DOSE OF 3 TABLETS IN THE AFTERNOON AS NEEDED FOR SWELLING    [provider]  traZODone (DESYREL) 50 MG tablet Take 1 tablet (50 mg total) by mouth at bedtime. 03/10/20   Angiulli, Lavon Paganini, PA-C     Family History  Problem Relation Age of Onset   Heart disease Mother 64   Hypertension Mother    Arthritis Mother    Heart failure Father 44   Stroke Maternal Aunt    Heart failure Sister    Colon cancer Neg Hx    Esophageal cancer Neg Hx  Stomach cancer Neg Hx    Rectal cancer Neg Hx     Social History   Socioeconomic History   Marital status: Widowed    Spouse name: Not on file   Number of children: 2   Years of education: college   Highest education level: Not on file  Occupational History   Occupation: retired  Tobacco Use   Smoking status: Former    Packs/day: 1.00    Years: 20.00    Pack years: 20.00    Types: Cigarettes   Smokeless tobacco: Never   Tobacco comments:       quit 20 years  smoked on and off for 20 years  Vaping Use   Vaping Use: Never used  Substance and Sexual Activity   Alcohol use: Not Currently    Comment: h/o heavy use   Drug use: Never   Sexual activity: Not Currently  Other Topics Concern   Not on file  Social History Narrative   ** Merged History Encounter **       The patient is divorced w/ 1 son 1 daughter He is a retired Regulatory affairs officer, he lived in [place] but [employer] including windows on the world in the [place] room in [place] He moved to [place] to be near his sister who liv   es in [place] alcoholic member of AA 2 caffeinated beverages daily prior smoker no drugs   Social Determinants of Radio broadcast assistant Strain: Low Risk    Difficulty of Paying Living Expenses: Not hard at [employer] Insecurity: No Food Insecurity   Worried About Paediatric nurse in the Last Year: Never true   Arboriculturist in the Last Year: Never true  Transportation Needs: No Transportation Needs   Lack of Transportation (Medical): No   Lack of Transportation (Non-Medical): No  Physical Activity: Not on file  Stress: No Stress Concern Present   Feeling of Stress : Not at all  Social Connections: Moderately Integrated   Frequency of Communication with Friends and Family: More than three times a week   Frequency of Social Gatherings with Friends and Family: More than three times a week   Attends Religious Services: 1 to 4 times per year   Active Member of [employer] or Organizations: No   Attends Archivist Meetings: 1 to 4 times per year   Marital Status: Widowed    Review of Systems  Review of Systems: A 12 point ROS discussed and pertinent positives are indicated in the HPI above.  All other systems are negative.  Physical Exam No direct physical exam was performed (except for noted visual exam findings with Video Visits).   Vital Signs: There were no vitals taken for this visit.  Imaging: No results found.  Labs:  CBC: Recent Labs    10/16/20 0306 10/17/20 0211 12/02/20 1046 12/03/20 0413  WBC 9.4 8.9 7.0 6.8  HGB 7.8* 8.1* 6.0* 8.3*  HCT 24.0* 24.6* 18.8* 24.1*  PLT 173 168 271 250    COAGS: Recent Labs    02/22/20 2027 02/24/20 1319 03/16/20 0508 09/07/20 1656 09/09/20 1001  INR 1.2 1.5* 1.3* 1.4*  --   APTT  --   --   --   --  72*    BMP: Recent Labs    02/18/20 1625 02/22/20 1141 04/10/20 5053 06/03/20 1217 06/16/20 9767 07/08/20 0047 10/16/20 0306 10/17/20 0211 12/02/20 1046 12/03/20 0413  NA 137   < >  137 133* 137   < > 138 138 132* 136  K 4.7   < > 4.5 3.9 4.4   < > 4.1 4.0 4.8 4.0  CL 100   < > 102 98 100   < > 100 100 98 103  CO2 22   < > 24 22 22    < > 26 27 23 24   GLUCOSE 82   < > 91 101* 84   < > 107* 150* 90 87  BUN 13   < > 13 12 16    < > 102* 98* 38* 38*  CALCIUM 8.8   < > 8.9  8.7 9.0   < > 8.5* 8.7* 8.6* 8.7*  CREATININE 1.06   < > 1.09 1.04 0.92   < > 2.93* 3.05* 3.44* 3.48*  GFRNONAA 67   < > 65 68 79   < > 21* 20* 17* 17*  GFRAA 77  --  75 79 92  --   --   --   --   --    < > = values in this interval not displayed.    LIVER FUNCTION TESTS: Recent Labs    03/16/20 0508 06/03/20 1217 09/07/20 1656 09/12/20 0159 10/13/20 1218 10/15/20 0340 10/16/20 0306 10/17/20 0211  BILITOT 1.2 0.5 1.2  --  0.6  --   --   --   AST 32 46* 19  --  21  --   --   --   ALT 15 29 11   --  29  --   --   --   ALKPHOS 85 129* 106  --  64  --   --   --   PROT 6.1* 5.7* 5.2*  --  5.8*  --   --   --   ALBUMIN 3.3* 3.4* 2.4*   < > 3.2* 2.4* 2.3* 2.4*   < > = values in this interval not displayed.    TUMOR MARKERS: No results for input(s): AFPTM, CEA, CA199, CHROMGRNA in the last 8760 hours.  Assessment and Plan:  Assessment:  Darryl Diaz is a 80yo male presenting with RU[place] 4 category symptoms of left lower extremity, involving the great toe.      Non-invasive lower extremity exam is equivocal given the non-compressible ABI.    I had a lengthy discussion with Darryl Diaz regarding anatomy, pathology/pathophysiology, natural history, and prognosis of PAD/CLI.  Informed consent regarding treatment strategies was performed which would possibly include continued conservative care, or endovascular options, with risk/benefit discussion.    Regarding endovascular options, specific risks discussed include: bleeding, infection, contrast reaction, renal injury/nephropathy, arterial injury/dissection, need for additional procedure/surgery, worsening symptoms/tissue including limb loss, cardiopulmonary collapse, death.    I was specific that while the wound might be stable now, there is a clear indication in these scenario to perform at least the angiogram, and that improved blood flow has been shown to decrease risk of amputation and improve healing capability.   After our  discussion, he has elected to proceed with endovascular options.     Regarding medical management, maximal medical therapy for reduction of risk factors is indicated as recommended by updated AHA guidelines1.  This includes anti-platelet medication, tight blood glucose control to a HbA1c < 7, tight blood pressure control, maximum-dose HMG-CoA reductase inhibitor, and smoking cessation.   He has already quit smoking  Annual flu vaccination is also recommended, with Class 1 recommendation1.   Plan: -Plan is to proceed with aorto-peripheral angiogram and possible  intervention with Dr. Earleen Newport at [facility], first available.  CO2 angiogram likely given his history of renal disease - maximal medical therapy for cardiovascular risk reduction, including anti-platelet therapy. ___________________________________________________________________   1Morley Kos MD, et al. 2016 AHA/ACC Guideline on the Management of Patients With Lower Extremity Peripheral Artery Disease: Executive Summary: A Report of the American College of Cardiology/American Heart Association Task Force on Clinical Practice Guidelines. J Am Coll Cardiol. 2017 Mar 21;69(11):1465-1508. doi: 10.1016/j.jacc.2016.11.008.   2 - Norgren L, et al. TASC II Working Group. Inter-society consensus for the management of peripheral arterial disease. Int Tressia Miners. 2007 Jun;26(2):81-157. Review. PubMed PMID: 37628315  3 - Hingorani A, et al. The management of diabetic foot: A clinical practice guideline by the Society for Vascular Surgery in collaboration with the [place] and the Society  for Vascular Medicine. J Vasc Surg. 2016 Feb;63(2 Suppl):3S-21S. doi: 10.1016/j.jvs.2015.10.003. PubMed PMID: 17616073.  4 - Corinna Gab, Saab FA, Luberta Mutter, Grant Ruts, Ewell Poe, Driver VR, Argentine, Lookstein R, van den Baldemar Lenis, Jaff Darryl, Guadalupe Dawn, Henao S, AlMahameed A, Katzen B. Digital Subtraction Angiography Prior to an Amputation for  Critical Limb Ischemia (CLI): An Expert Recommendation Statement From the CLI Global Society to Optimize Limb Salvage. J Endovasc Ther. 2020 Aug;27(4):540-546. doi: 10.1177/1526602820928590. Epub 2020 May 29. PMID: 71062694.    Thank you for this interesting consult.  I greatly enjoyed meeting ROMUALD MCCASLIN and look forward to participating in their care.  A copy of this report was sent to the requesting provider on this date.  Electronically Signed: Corrie Mckusick 12/31/2020, 2:51 PM   I spent a total of  40 Minutes   in remote  clinical consultation, greater than 50% of which was counseling/coordinating care for left lower extremity CLTI/CLI, possible angiogram and treatment.    Visit type: Audio only (telephone). Audio (no video) only due to patient's lack of internet/smartphone capability. Alternative for in-person consultation at [facility] [address], [place], [place]. This visit type was conducted due to national recommendations for restrictions regarding the COVID-19 Pandemic (e.g. social distancing).  This format is felt to be most appropriate for this patient at this time.  All issues noted in this document were discussed and addressed.

## 2021-01-01 DIAGNOSIS — D649 Anemia, unspecified: Secondary | ICD-10-CM | POA: Diagnosis not present

## 2021-01-01 DIAGNOSIS — M6281 Muscle weakness (generalized): Secondary | ICD-10-CM | POA: Diagnosis not present

## 2021-01-01 DIAGNOSIS — I482 Chronic atrial fibrillation, unspecified: Secondary | ICD-10-CM | POA: Diagnosis not present

## 2021-01-01 DIAGNOSIS — R278 Other lack of coordination: Secondary | ICD-10-CM | POA: Diagnosis not present

## 2021-01-04 DIAGNOSIS — M6281 Muscle weakness (generalized): Secondary | ICD-10-CM | POA: Diagnosis not present

## 2021-01-04 DIAGNOSIS — I482 Chronic atrial fibrillation, unspecified: Secondary | ICD-10-CM | POA: Diagnosis not present

## 2021-01-04 DIAGNOSIS — R278 Other lack of coordination: Secondary | ICD-10-CM | POA: Diagnosis not present

## 2021-01-04 DIAGNOSIS — D649 Anemia, unspecified: Secondary | ICD-10-CM | POA: Diagnosis not present

## 2021-01-05 DIAGNOSIS — M6281 Muscle weakness (generalized): Secondary | ICD-10-CM | POA: Diagnosis not present

## 2021-01-05 DIAGNOSIS — D649 Anemia, unspecified: Secondary | ICD-10-CM | POA: Diagnosis not present

## 2021-01-05 DIAGNOSIS — R278 Other lack of coordination: Secondary | ICD-10-CM | POA: Diagnosis not present

## 2021-01-05 DIAGNOSIS — I482 Chronic atrial fibrillation, unspecified: Secondary | ICD-10-CM | POA: Diagnosis not present

## 2021-01-06 ENCOUNTER — Other Ambulatory Visit: Payer: Self-pay

## 2021-01-06 ENCOUNTER — Encounter (HOSPITAL_COMMUNITY): Payer: Self-pay | Admitting: Emergency Medicine

## 2021-01-06 ENCOUNTER — Inpatient Hospital Stay (HOSPITAL_COMMUNITY)
Admission: EM | Admit: 2021-01-06 | Discharge: 2021-01-09 | DRG: 378 | Disposition: A | Discharge status: Home / Self Care | Payer: Medicare Other | Attending: Internal Medicine | Admitting: Internal Medicine

## 2021-01-06 DIAGNOSIS — D649 Anemia, unspecified: Secondary | ICD-10-CM | POA: Diagnosis present

## 2021-01-06 DIAGNOSIS — N184 Chronic kidney disease, stage 4 (severe): Secondary | ICD-10-CM | POA: Diagnosis present

## 2021-01-06 DIAGNOSIS — I10 Essential (primary) hypertension: Secondary | ICD-10-CM | POA: Diagnosis present

## 2021-01-06 DIAGNOSIS — I480 Paroxysmal atrial fibrillation: Secondary | ICD-10-CM | POA: Diagnosis present

## 2021-01-06 DIAGNOSIS — K5521 Angiodysplasia of colon with hemorrhage: Principal | ICD-10-CM | POA: Diagnosis present

## 2021-01-06 DIAGNOSIS — M19042 Primary osteoarthritis, left hand: Secondary | ICD-10-CM | POA: Diagnosis present

## 2021-01-06 DIAGNOSIS — E1151 Type 2 diabetes mellitus with diabetic peripheral angiopathy without gangrene: Secondary | ICD-10-CM | POA: Diagnosis present

## 2021-01-06 DIAGNOSIS — D631 Anemia in chronic kidney disease: Secondary | ICD-10-CM | POA: Diagnosis not present

## 2021-01-06 DIAGNOSIS — I130 Hypertensive heart and chronic kidney disease with heart failure and stage 1 through stage 4 chronic kidney disease, or unspecified chronic kidney disease: Secondary | ICD-10-CM | POA: Diagnosis present

## 2021-01-06 DIAGNOSIS — R319 Hematuria, unspecified: Secondary | ICD-10-CM | POA: Diagnosis not present

## 2021-01-06 DIAGNOSIS — I5032 Chronic diastolic (congestive) heart failure: Secondary | ICD-10-CM | POA: Diagnosis not present

## 2021-01-06 DIAGNOSIS — Z8674 Personal history of sudden cardiac arrest: Secondary | ICD-10-CM

## 2021-01-06 DIAGNOSIS — I77810 Thoracic aortic ectasia: Secondary | ICD-10-CM | POA: Diagnosis present

## 2021-01-06 DIAGNOSIS — Z6832 Body mass index (BMI) 32.0-32.9, adult: Secondary | ICD-10-CM

## 2021-01-06 DIAGNOSIS — D62 Acute posthemorrhagic anemia: Secondary | ICD-10-CM | POA: Diagnosis present

## 2021-01-06 DIAGNOSIS — Z880 Allergy status to penicillin: Secondary | ICD-10-CM

## 2021-01-06 DIAGNOSIS — R600 Localized edema: Secondary | ICD-10-CM | POA: Diagnosis not present

## 2021-01-06 DIAGNOSIS — N189 Chronic kidney disease, unspecified: Secondary | ICD-10-CM | POA: Diagnosis not present

## 2021-01-06 DIAGNOSIS — E876 Hypokalemia: Secondary | ICD-10-CM | POA: Diagnosis present

## 2021-01-06 DIAGNOSIS — Z66 Do not resuscitate: Secondary | ICD-10-CM | POA: Diagnosis not present

## 2021-01-06 DIAGNOSIS — Z7901 Long term (current) use of anticoagulants: Secondary | ICD-10-CM

## 2021-01-06 DIAGNOSIS — K644 Residual hemorrhoidal skin tags: Secondary | ICD-10-CM | POA: Diagnosis present

## 2021-01-06 DIAGNOSIS — I252 Old myocardial infarction: Secondary | ICD-10-CM

## 2021-01-06 DIAGNOSIS — E669 Obesity, unspecified: Secondary | ICD-10-CM | POA: Diagnosis present

## 2021-01-06 DIAGNOSIS — I4819 Other persistent atrial fibrillation: Secondary | ICD-10-CM | POA: Diagnosis present

## 2021-01-06 DIAGNOSIS — Z8249 Family history of ischemic heart disease and other diseases of the circulatory system: Secondary | ICD-10-CM

## 2021-01-06 DIAGNOSIS — Z20822 Contact with and (suspected) exposure to covid-19: Secondary | ICD-10-CM | POA: Diagnosis not present

## 2021-01-06 DIAGNOSIS — F329 Major depressive disorder, single episode, unspecified: Secondary | ICD-10-CM | POA: Diagnosis present

## 2021-01-06 DIAGNOSIS — R54 Age-related physical debility: Secondary | ICD-10-CM | POA: Diagnosis present

## 2021-01-06 DIAGNOSIS — Z86010 Personal history of colonic polyps: Secondary | ICD-10-CM

## 2021-01-06 DIAGNOSIS — L03116 Cellulitis of left lower limb: Secondary | ICD-10-CM | POA: Diagnosis not present

## 2021-01-06 DIAGNOSIS — I129 Hypertensive chronic kidney disease with stage 1 through stage 4 chronic kidney disease, or unspecified chronic kidney disease: Secondary | ICD-10-CM | POA: Diagnosis not present

## 2021-01-06 DIAGNOSIS — Z87891 Personal history of nicotine dependence: Secondary | ICD-10-CM

## 2021-01-06 DIAGNOSIS — N179 Acute kidney failure, unspecified: Secondary | ICD-10-CM | POA: Diagnosis not present

## 2021-01-06 DIAGNOSIS — I872 Venous insufficiency (chronic) (peripheral): Secondary | ICD-10-CM | POA: Diagnosis present

## 2021-01-06 DIAGNOSIS — I4891 Unspecified atrial fibrillation: Secondary | ICD-10-CM | POA: Diagnosis not present

## 2021-01-06 DIAGNOSIS — K219 Gastro-esophageal reflux disease without esophagitis: Secondary | ICD-10-CM | POA: Diagnosis present

## 2021-01-06 DIAGNOSIS — N058 Unspecified nephritic syndrome with other morphologic changes: Secondary | ICD-10-CM | POA: Diagnosis not present

## 2021-01-06 DIAGNOSIS — I70245 Atherosclerosis of native arteries of left leg with ulceration of other part of foot: Secondary | ICD-10-CM | POA: Diagnosis present

## 2021-01-06 DIAGNOSIS — E11621 Type 2 diabetes mellitus with foot ulcer: Secondary | ICD-10-CM | POA: Diagnosis present

## 2021-01-06 DIAGNOSIS — R809 Proteinuria, unspecified: Secondary | ICD-10-CM | POA: Diagnosis not present

## 2021-01-06 DIAGNOSIS — K760 Fatty (change of) liver, not elsewhere classified: Secondary | ICD-10-CM | POA: Diagnosis present

## 2021-01-06 DIAGNOSIS — E1122 Type 2 diabetes mellitus with diabetic chronic kidney disease: Secondary | ICD-10-CM | POA: Diagnosis present

## 2021-01-06 DIAGNOSIS — Z823 Family history of stroke: Secondary | ICD-10-CM

## 2021-01-06 DIAGNOSIS — K648 Other hemorrhoids: Secondary | ICD-10-CM | POA: Diagnosis present

## 2021-01-06 DIAGNOSIS — M19041 Primary osteoarthritis, right hand: Secondary | ICD-10-CM | POA: Diagnosis present

## 2021-01-06 DIAGNOSIS — E785 Hyperlipidemia, unspecified: Secondary | ICD-10-CM | POA: Diagnosis present

## 2021-01-06 DIAGNOSIS — N057 Unspecified nephritic syndrome with diffuse crescentic glomerulonephritis: Secondary | ICD-10-CM | POA: Diagnosis not present

## 2021-01-06 DIAGNOSIS — Z79899 Other long term (current) drug therapy: Secondary | ICD-10-CM

## 2021-01-06 DIAGNOSIS — K922 Gastrointestinal hemorrhage, unspecified: Secondary | ICD-10-CM | POA: Diagnosis not present

## 2021-01-06 DIAGNOSIS — F32A Depression, unspecified: Secondary | ICD-10-CM | POA: Diagnosis present

## 2021-01-06 DIAGNOSIS — Z8261 Family history of arthritis: Secondary | ICD-10-CM

## 2021-01-06 DIAGNOSIS — K5730 Diverticulosis of large intestine without perforation or abscess without bleeding: Secondary | ICD-10-CM | POA: Diagnosis present

## 2021-01-06 DIAGNOSIS — K319 Disease of stomach and duodenum, unspecified: Secondary | ICD-10-CM | POA: Diagnosis present

## 2021-01-06 DIAGNOSIS — G4700 Insomnia, unspecified: Secondary | ICD-10-CM | POA: Diagnosis present

## 2021-01-06 DIAGNOSIS — N390 Urinary tract infection, site not specified: Secondary | ICD-10-CM | POA: Diagnosis not present

## 2021-01-06 DIAGNOSIS — Z96643 Presence of artificial hip joint, bilateral: Secondary | ICD-10-CM | POA: Diagnosis present

## 2021-01-06 DIAGNOSIS — E7800 Pure hypercholesterolemia, unspecified: Secondary | ICD-10-CM | POA: Diagnosis not present

## 2021-01-06 DIAGNOSIS — L97529 Non-pressure chronic ulcer of other part of left foot with unspecified severity: Secondary | ICD-10-CM | POA: Diagnosis present

## 2021-01-06 LAB — CBC WITH DIFFERENTIAL/PLATELET
Abs Immature Granulocytes: 0.02 10*3/uL (ref 0.00–0.07)
Basophils Absolute: 0 10*3/uL (ref 0.0–0.1)
Basophils Relative: 0 %
Eosinophils Absolute: 0 10*3/uL (ref 0.0–0.5)
Eosinophils Relative: 0 %
HCT: 18.2 % — ABNORMAL LOW (ref 39.0–52.0)
Hemoglobin: 6 g/dL — CL (ref 13.0–17.0)
Immature Granulocytes: 0 %
Lymphocytes Relative: 19 %
Lymphs Abs: 1 10*3/uL (ref 0.7–4.0)
MCH: 32.6 pg (ref 26.0–34.0)
MCHC: 33 g/dL (ref 30.0–36.0)
MCV: 98.9 fL (ref 80.0–100.0)
Monocytes Absolute: 0.6 10*3/uL (ref 0.1–1.0)
Monocytes Relative: 11 %
Neutro Abs: 3.8 10*3/uL (ref 1.7–7.7)
Neutrophils Relative %: 70 %
Platelets: 185 10*3/uL (ref 150–400)
RBC: 1.84 MIL/uL — ABNORMAL LOW (ref 4.22–5.81)
RDW: 13.5 % (ref 11.5–15.5)
WBC: 5.5 10*3/uL (ref 4.0–10.5)
nRBC: 0 % (ref 0.0–0.2)

## 2021-01-06 LAB — COMPREHENSIVE METABOLIC PANEL
ALT: 13 U/L (ref 0–44)
AST: 25 U/L (ref 15–41)
Albumin: 3 g/dL — ABNORMAL LOW (ref 3.5–5.0)
Alkaline Phosphatase: 64 U/L (ref 38–126)
Anion gap: 10 (ref 5–15)
BUN: 26 mg/dL — ABNORMAL HIGH (ref 8–23)
CO2: 23 mmol/L (ref 22–32)
Calcium: 8.4 mg/dL — ABNORMAL LOW (ref 8.9–10.3)
Chloride: 101 mmol/L (ref 98–111)
Creatinine, Ser: 3.08 mg/dL — ABNORMAL HIGH (ref 0.61–1.24)
GFR, Estimated: 20 mL/min — ABNORMAL LOW (ref >60)
Glucose, Bld: 118 mg/dL — ABNORMAL HIGH (ref 70–99)
Potassium: 3.6 mmol/L (ref 3.5–5.1)
Sodium: 134 mmol/L — ABNORMAL LOW (ref 135–145)
Total Bilirubin: 0.2 mg/dL — ABNORMAL LOW (ref 0.3–1.2)
Total Protein: 5.5 g/dL — ABNORMAL LOW (ref 6.5–8.1)

## 2021-01-06 LAB — PREPARE RBC (CROSSMATCH)

## 2021-01-06 LAB — PROTIME-INR
INR: 1.1 (ref 0.8–1.2)
Prothrombin Time: 14.4 seconds (ref 11.4–15.2)

## 2021-01-06 LAB — POC OCCULT BLOOD, ED: Fecal Occult Bld: POSITIVE — AB

## 2021-01-06 MED ORDER — TORSEMIDE 20 MG PO TABS: 60.0000 mg | ORAL_TABLET | ORAL | Status: DC

## 2021-01-06 MED ORDER — SODIUM CHLORIDE 0.9% FLUSH
3.0000 mL | Freq: Two times a day (BID) | INTRAVENOUS | Status: DC
  Administered 2021-01-06 – 2021-01-09 (×4): 3 mL via INTRAVENOUS

## 2021-01-06 MED ORDER — AMLODIPINE BESYLATE 5 MG PO TABS
5.0000 mg | ORAL_TABLET | Freq: Every day | ORAL | Status: DC
  Administered 2021-01-07 (×2): 5 mg via ORAL
  Filled 2021-01-06 (×2): qty 1

## 2021-01-06 MED ORDER — SENNOSIDES-DOCUSATE SODIUM 8.6-50 MG PO TABS: 1.0000 | ORAL_TABLET | Freq: Every evening | ORAL | Status: DC | PRN

## 2021-01-06 MED ORDER — ATORVASTATIN CALCIUM 10 MG PO TABS
20.0000 mg | ORAL_TABLET | Freq: Every day | ORAL | Status: DC
  Administered 2021-01-07 – 2021-01-09 (×3): 20 mg via ORAL
  Filled 2021-01-06 (×3): qty 2

## 2021-01-06 MED ORDER — PANTOPRAZOLE SODIUM 40 MG IV SOLR
40.0000 mg | Freq: Once | INTRAVENOUS | Status: AC
  Administered 2021-01-06: 40 mg via INTRAVENOUS
  Filled 2021-01-06: qty 40

## 2021-01-06 MED ORDER — AMIODARONE HCL 200 MG PO TABS
200.0000 mg | ORAL_TABLET | Freq: Every day | ORAL | Status: DC
  Administered 2021-01-07 – 2021-01-09 (×3): 200 mg via ORAL
  Filled 2021-01-06 (×3): qty 1

## 2021-01-06 MED ORDER — SODIUM CHLORIDE 0.9 % IV SOLN: 10.0000 mL/h | Freq: Once | INTRAVENOUS | Status: DC

## 2021-01-06 MED ORDER — ACETAMINOPHEN 650 MG RE SUPP
650.0000 mg | Freq: Four times a day (QID) | RECTAL | Status: DC | PRN
Start: 2021-01-06 — End: 2021-01-07

## 2021-01-06 MED ORDER — POTASSIUM CHLORIDE CRYS ER 20 MEQ PO TBCR: 20.0000 meq | EXTENDED_RELEASE_TABLET | Freq: Every day | ORAL | Status: DC

## 2021-01-06 MED ORDER — ESCITALOPRAM OXALATE 10 MG PO TABS
10.0000 mg | ORAL_TABLET | Freq: Every day | ORAL | Status: DC
  Administered 2021-01-07 – 2021-01-09 (×3): 10 mg via ORAL
  Filled 2021-01-06 (×3): qty 1

## 2021-01-06 MED ORDER — ONDANSETRON HCL 4 MG PO TABS: 4.0000 mg | ORAL_TABLET | Freq: Four times a day (QID) | ORAL | Status: DC | PRN

## 2021-01-06 MED ORDER — ONDANSETRON HCL 4 MG/2ML IJ SOLN: 4.0000 mg | Freq: Four times a day (QID) | INTRAMUSCULAR | Status: DC | PRN

## 2021-01-06 MED ORDER — PANTOPRAZOLE SODIUM 40 MG IV SOLR
40.0000 mg | Freq: Two times a day (BID) | INTRAVENOUS | Status: DC
  Administered 2021-01-07 – 2021-01-09 (×4): 40 mg via INTRAVENOUS
  Filled 2021-01-06 (×5): qty 40

## 2021-01-06 MED ORDER — ACETAMINOPHEN 325 MG PO TABS: 650.0000 mg | ORAL_TABLET | Freq: Four times a day (QID) | ORAL | Status: DC | PRN

## 2021-01-06 MED ORDER — TORSEMIDE 20 MG PO TABS: 60.0000 mg | ORAL_TABLET | Freq: Every morning | ORAL | Status: DC

## 2021-01-06 NOTE — ED Notes (Signed)
Pt's Bp has been trending up.( 174/71)  MD Posey Pronto notified.

## 2021-01-06 NOTE — ED Provider Notes (Signed)
Emergency Medicine Provider Triage Evaluation Note  Darryl Diaz , a 80 y.o. male  was evaluated in triage.  Pt complains of low hemoglobin requiring transfusion this morning at his doctor. Patient endorses recent hospitalization for a toe infection. He also has autoimmune glomerulonephritis. No fatigue, no dizziness, no syncope, no chest pain, no shortness of breath. No BRBPR, no melena, no hematuria, no history of bleeding ulcer. Patient does endorse afib and reports he takes eliquis.  Review of Systems  Positive: As above Negative: As above  Physical Exam  Ht 6' (1.829 m)   Wt 108 kg   BMI 32.29 kg/m  Gen:   Awake, no distress, pale Resp:  Normal effort  MSK:   Moves extremities without difficulty  Other:    Medical Decision Making  Medically screening exam initiated at 6:38 PM.  Appropriate orders placed.  Darryl Diaz was informed that the remainder of the evaluation will be completed by another provider, this initial triage assessment does not replace that evaluation, and the importance of remaining in the ED until their evaluation is complete.  Low hemoglobin   Darryl Diaz 01/06/21 1843    Truddie Hidden, MD 01/07/21 [phone]

## 2021-01-06 NOTE — ED Triage Notes (Addendum)
Pt here per dr after blood was drawn this AM and was told his hemoglobin was low and he needed to go to the ER to get a transfusion. Pt denies weakness, dizziness, no dark stools. Hx of afib, is on eliquis. Lives at [facility] independent living

## 2021-01-06 NOTE — ED Provider Notes (Signed)
[facility] EMERGENCY DEPARTMENT Provider Note   CSN: [id number] Arrival date & time: 01/06/21  1829     History Chief Complaint  Patient presents with   Abnormal Lab    Darryl Diaz is a 80 y.o. male.  The history is provided by the patient and medical records. No language interpreter was used.  Abnormal Lab   80 year old male with hx of GI bleed, CHF, afib on eliquis, HTN presenting with concerns of low hemogloblin.  Patient reported he went to his kidney doctor today for a visit and had blood work done.  He was notified that his hemoglobin was low and he needs to come to the ER.  Patient overall states he is at baseline.  He denies any lightheadedness dizziness chest pain shortness of breath abdominal pain or noticed any change in his stool color but states he does not normally checked.  He is currently on Eliquis.  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 following wife's death   Anemia    Ascending aorta dilation (HCC)    Cardiac arrest ([place]) 04/20/2019   12 min CPR with epi   Chronic diastolic CHF (congestive heart failure) (HCC)    Fatty liver    Gastritis    GERD (gastroesophageal reflux disease)    H/O seasonal allergies    History of GI bleed    Hx of adenomatous colonic polyps 08/10/2017   Hyperlipidemia    Hypertension    Insomnia    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   Mallory-Weiss tear    OA (osteoarthritis)    hands   OSA (obstructive sleep apnea)    No longer uses CPAP   Persistent atrial fibrillation with rapid ventricular response ([place]) 02/05/2020   Recurrent syncope    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([place])     Patient Active Problem List   Diagnosis Date Noted   Symptomatic anemia 12/02/2020   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   Orthostatic hypotension 11/23/2020   Acute renal insufficiency 10/23/2020   Alcohol abuse 10/23/2020    Hypokalemia 10/23/2020   Hypomagnesemia 10/23/2020   Low back pain at multiple sites 10/23/2020   Malnutrition of mild degree Altamease Oiler: 75% to less than 90% of standard weight) ([place]) 10/23/2020   Open wound of scalp without complication 03/50/0938   Oral candidiasis 10/23/2020   Cellulitis of left foot    Cellulitis of toe of left foot 10/13/2020   Skin ulcer of left great toe ([place]) 10/13/2020   CKD (chronic kidney disease) stage 4, 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/13/2020   Chronic heart failure with preserved ejection fraction (HFpEF) ([place]) 10/13/2020   Transient weakness of left lower extremity 09/07/2020   Heme positive stool 05/14/2020   Abnormality of gait 04/27/2020   Salmonella food poisoning 18/29/9371   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   Yeast infection 03/16/2020   Diarrhea 03/16/2020   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 (HCC)    Allergies    Anemia, chronic disease    History of hypertension    Debility    Syncope and collapse 02/22/2020   AKI (acute kidney injury) ([place]) 02/10/2020   Unspecified protein-calorie malnutrition ([place]) 02/10/2020   Weakness 02/06/2020   History of repair of hip joint 10/15/2019   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   Pain in right foot 06/27/2019   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  Bradycardia 04/20/2019   Chronic diastolic heart failure ([place]) 03/14/2019   Atrial fibrillation with rapid ventricular response ([place])    Gastrointestinal hemorrhage 02/25/2019   Fall at home, initial encounter 02/25/2019   Bilateral lower extremity edema 02/25/2019   Hyponatremia 02/25/2019   Fall 02/25/2019   Benign prostatic hyperplasia with lower urinary tract symptoms 12/19/2018   Iron deficiency anemia due to chronic blood loss 08/11/2018   Anemia 08/11/2018   Hardening of the aorta (main artery of the heart) ([place]) 08/11/2018   Benign neoplasm of colon 06/18/2018   History of adenomatous polyp of colon  08/10/2017   PAF (paroxysmal atrial fibrillation) ([place]) 02/05/2017   Impaired fasting glucose 06/10/2016   Encounter for general adult medical examination without abnormal findings 06/03/2016   Posterior dislocation of hip, closed ([place]) 04/30/2016   Hip fracture ([place]) 03/30/2016   Hypertension 03/30/2016   Hyperlipidemia 03/30/2016   Osteoarthritis 03/30/2016   Depressive disorder 03/30/2016   Rhabdomyolysis 03/30/2016   Leukocytosis 03/30/2016   Pressure ulcer 03/30/2016   Fracture of hip ([place]) 03/30/2016   Gastroesophageal reflux disease 12/17/2015   Paroxysmal atrial fibrillation ([place]) 12/17/2015   Hypertensive disorder 12/17/2015   Long term (current) use of anticoagulants 12/17/2015   Moderate major depression, single episode ([place]) 12/17/2015   Pure hypercholesterolemia 12/17/2015   Seasonal allergic rhinitis 12/17/2015   CAD (coronary artery disease) 08/21/2015   New onset atrial fibrillation ([place]) 11/04/2014    Past Surgical History:  Procedure Laterality Date   ANKLE SURGERY Left 2014   had rods put in    BIOPSY  02/28/2019   Procedure: BIOPSY;  Surgeon: Milus Banister, MD;  Location: [place];  Service: Endoscopy;;   CARDIOVERSION  02/06/2020   CARDIOVERSION N/A 02/06/2020   Procedure: CARDIOVERSION;  Surgeon: Werner Lean, MD;  Location: [facility] ENDOSCOPY;  Service: Cardiovascular;  Laterality: N/A;   CARDIOVERSION N/A 02/24/2020   Procedure: CARDIOVERSION;  Surgeon: Werner Lean, MD;  Location: [facility] ENDOSCOPY;  Service: Cardiovascular;  Laterality: N/A;   COLONOSCOPY     [place]   ESOPHAGOGASTRODUODENOSCOPY (EGD) WITH PROPOFOL N/A 02/28/2019   Procedure: ESOPHAGOGASTRODUODENOSCOPY (EGD) WITH PROPOFOL;  Surgeon: Milus Banister, MD;  Location: [facility] ENDOSCOPY;  Service: Endoscopy;  Laterality: N/A;   HIP ARTHROPLASTY Left 04/01/2016   Procedure: ARTHROPLASTY BIPOLAR HIP (HEMIARTHROPLASTY);  Surgeon: Paralee Cancel, MD;  Location: [place];  Service:  Orthopedics;  Laterality: Left;   HIP CLOSED REDUCTION Left 04/30/2016   Procedure: CLOSED REDUCTION HIP;  Surgeon: Wylene Simmer, MD;  Location: [place];  Service: Orthopedics;  Laterality: Left;   IR RADIOLOGIST EVAL & MGMT  12/31/2020   TONSILLECTOMY     TOTAL HIP ARTHROPLASTY Right 10/15/2019   Procedure: TOTAL HIP ARTHROPLASTY ANTERIOR APPROACH;  Surgeon: Paralee Cancel, MD;  Location: [facility];  Service: Orthopedics;  Laterality: Right;  70 mins       Family History  Problem Relation Age of Onset   Heart disease Mother 38   Hypertension Mother    Arthritis Mother    Heart failure Father 55   Stroke Maternal Aunt    Heart failure Sister    Colon cancer Neg Hx    Esophageal cancer Neg Hx    Stomach cancer Neg Hx    Rectal cancer Neg Hx     Social History   Tobacco Use   Smoking status: Former    Packs/day: 1.00    Years: 20.00    Pack years: 20.00    Types: Cigarettes  Smokeless tobacco: Never   Tobacco comments:       quit 20 years  smoked on and off for 20 years  Vaping Use   Vaping Use: Never used  Substance Use Topics   Alcohol use: Not Currently    Comment: h/o heavy use   Drug use: Never    Home Medications Prior to Admission medications   Medication Sig Start Date End Date Taking? Authorizing Provider  acetaminophen (TYLENOL) 325 MG tablet Take 2 tablets (650 mg total) by mouth every 4 (four) hours as needed for headache or mild pain. 03/10/20   Angiulli, Lavon Paganini, PA-C  amiodarone (PACERONE) 200 MG tablet Take 1 tablet (200 mg total) by mouth daily. 04/10/20   Dunn, Nedra Hai, PA-C  amLODipine (NORVASC) 5 MG tablet Take 1 tablet (5 mg total) by mouth daily. 09/14/20   Caren Griffins, MD  apixaban (ELIQUIS) 2.5 MG TABS tablet Take 1 tablet (2.5 mg total) by mouth 2 (two) times daily. 12/03/20   Aline August, MD  atorvastatin (LIPITOR) 20 MG tablet Take 1 tablet (20 mg total) by mouth daily. 03/10/20   Angiulli, Lavon Paganini, PA-C  escitalopram (LEXAPRO) 10 MG tablet  Take 1 tablet (10 mg total) by mouth daily. 03/10/20   Angiulli, Lavon Paganini, PA-C  ferrous sulfate 325 (65 FE) MG tablet Take 1 tablet (325 mg total) by mouth daily. 12/03/20 01/02/21  Aline August, MD  folic acid (FOLVITE) 1 MG tablet Take 1 tablet (1 mg total) by mouth daily. 03/10/20   Angiulli, Lavon Paganini, PA-C  ketoconazole (NIZORAL) 2 % cream Apply 1 application topically daily. To dry scaly skin on feet 07/23/20   McDonald, Adam R, DPM  loratadine (CLARITIN) 10 MG tablet Take 1 tablet (10 mg total) by mouth daily. 02/12/20   Medina-Vargas, Monina C, NP  Magnesium Oxide 400 MG CAPS Take 1 capsule (400 mg total) by mouth daily. 10/17/20   Eugenie Filler, MD  methocarbamol (ROBAXIN) 500 MG tablet Take 1 tablet (500 mg total) by mouth every 6 (six) hours as needed for muscle spasms. 12/03/20   Aline August, MD  Multiple Vitamins-Minerals (CENTRUM SILVER 50+MEN) TABS Take 1 tablet by mouth daily with breakfast.    [provider]  nitroGLYCERIN (NITROSTAT) 0.4 MG SL tablet Place 1 tablet (0.4 mg total) under the tongue every 5 (five) minutes x 3 doses as needed for chest pain. 07/28/20   Baldwin Jamaica, PA-C  pantoprazole (PROTONIX) 40 MG tablet Take 1 tablet (40 mg total) by mouth daily. 03/10/20   Angiulli, Lavon Paganini, PA-C  potassium chloride SA (KLOR-CON) 20 MEQ tablet Take 1 tablet (20 mEq total) by mouth daily. 10/17/20 10/12/21  Eugenie Filler, MD  torsemide (DEMADEX) 20 MG tablet Take 60 mg by mouth See admin instructions. TAKE 3 TABLETS BY MOUTH EVERY MORNING, MAY TAKE A SECOND DOSE OF 3 TABLETS IN THE AFTERNOON AS NEEDED FOR SWELLING    [provider]  traZODone (DESYREL) 50 MG tablet Take 1 tablet (50 mg total) by mouth at bedtime. 03/10/20   Angiulli, Lavon Paganini, PA-C    Allergies    Penicillin g sodium, Penicillins, and Penicillins  Review of Systems   Review of Systems  All other systems reviewed and are negative.  Physical Exam Updated Vital Signs BP (!) 155/68  (BP Location: Left Arm)   Pulse 66   Temp 98.6 F (37 C) (Oral)   Resp 18   Ht 6' (1.829 m)   [facility]  108 kg   SpO2 100%   BMI 32.29 kg/m   Physical Exam Vitals and nursing note reviewed.  Constitutional:      General: He is not in acute distress.    Appearance: He is well-developed.  HENT:     Head: Atraumatic.  Eyes:     Conjunctiva/sclera: Conjunctivae normal.  Cardiovascular:     Rate and Rhythm: Rhythm irregular.     Heart sounds: Murmur heard.  Pulmonary:     Effort: Pulmonary effort is normal.     Breath sounds: Normal breath sounds. No wheezing, rhonchi or rales.  Abdominal:     Palpations: Abdomen is soft.     Tenderness: There is no abdominal tenderness.  Genitourinary:    Comments: Chaperone present during exam. Black tarry stool noted on glove normal rectal tone no obvious mass. Musculoskeletal:     Cervical back: Neck supple.  Skin:    Coloration: Skin is pale.     Findings: No rash.  Neurological:     Mental Status: He is alert. Mental status is at baseline.  Psychiatric:        Mood and Affect: Mood normal.    ED Results / Procedures / Treatments   Labs (all labs ordered are listed, but only abnormal results are displayed) Labs Reviewed  CBC WITH DIFFERENTIAL/PLATELET - Abnormal; Notable for the following components:      Result Value   RBC 1.84 (*)    Hemoglobin 6.0 (*)    HCT 18.2 (*)    All other components within normal limits  COMPREHENSIVE METABOLIC PANEL - Abnormal; Notable for the following components:   Sodium 134 (*)    Glucose, Bld 118 (*)    BUN 26 (*)    Creatinine, Ser 3.08 (*)    Calcium 8.4 (*)    Total Protein 5.5 (*)    Albumin 3.0 (*)    Total Bilirubin 0.2 (*)    GFR, Estimated 20 (*)    All other components within normal limits  POC OCCULT BLOOD, ED - Abnormal; Notable for the following components:   Fecal Occult Bld POSITIVE (*)    All other components within normal limits  SARS CORONAVIRUS 2 (TAT 6-24 HRS)   PROTIME-INR  TYPE AND SCREEN  PREPARE RBC (CROSSMATCH)    EKG None  Radiology No results found.  Procedures .Critical Care  Date/Time: 01/06/2021 9:57 PM Performed by: Domenic Moras, PA-C Authorized by: Domenic Moras, PA-C   Critical care provider statement:    Critical care time (minutes):  32   Critical care was time spent personally by me on the following activities:  Discussions with consultants, evaluation of patient's response to treatment, examination of patient, ordering and performing treatments and interventions, ordering and review of laboratory studies, ordering and review of radiographic studies, pulse oximetry, re-evaluation of patient's condition, obtaining history from patient or surrogate and review of old charts   Medications Ordered in ED Medications  0.9 %  sodium chloride infusion (has no administration in time range)  pantoprazole (PROTONIX) injection 40 mg (40 mg Intravenous Given 01/06/21 2122)    ED Course  I have reviewed the triage vital signs and the nursing notes.  Pertinent labs & imaging results that were available during my care of the patient were reviewed by me and considered in my medical decision making (see chart for details).    MDM Rules/Calculators/A&P  BP (!) 170/73   Pulse 72   Temp 98 F (36.7 C) (Oral)   Resp 15   Ht 6' (1.829 m)   Wt 108 kg   SpO2 100%   BMI 32.29 kg/m   Final Clinical Impression(s) / ED Diagnoses Final diagnoses:  Upper GI bleed  Anemia, unspecified type    Rx / DC Orders ED Discharge Orders     None      9:05 PM -Patient with history of anemia who was sent here due to low hemoglobin his kidney doctor's office today.  He does have a documented hemoglobin of 6.0.  He is pale in appearance but without any complaints of pain or lightheadedness or dizziness.  He does have black tarry stool on exam and fecal occult blood test positive.  This is likely a GI bleed.  He is  currently on Eliquis.  Will consult gastroenterology and will consult medicine for admission.  I have ordered Protonix and blood product.  CAre discussed with DR. Haviland.   9:15 PM Appreciate consultation from GI specialist DR. Pyrtle who agrees with admission and will see pt tomorrow morning.  Recommend holding eliquis.   9:58 PM Appreciate consultation from [place] DR. Patel who agrees to see and will admit pt for further care.  Pt is in agreement of plan and amenable for blood transfusion.  I have ordered 2 unit of blood.    Domenic Moras, PA-C 01/06/21 2201    [name], MD 01/06/21 2256

## 2021-01-06 NOTE — H&P (Signed)
History and Physical    JAYLENE SCHROM BMW:[id number] DOB: 12/24/1940 DOA: 01/06/2021  PCP: [name], MD  Patient coming from: Home  I have personally briefly reviewed patient's old medical records in [place]  Chief Complaint: Anemia  HPI: Darryl Diaz is a 80 y.o. male with medical history significant for paroxysmal atrial fibrillation on Eliquis, chronic diastolic CHF, CKD stage IV, tachycardia-bradycardia syndrome, PAD with nonhealing left foot wounds, HTN, HLD, history of GI bleed, and mood disorder who presented to the ED for evaluation of anemia.  Patient states he had routine follow-up with his nephrologist, Dr. Joelyn Oms, morning of 01/06/2021.  Labs were obtained and he was notified that his hemoglobin level was low and advised to present to the ED for further evaluation.  Patient does take Eliquis twice a day with last dose taken evening of 8/30.  He says he does not really monitor his stools for obvious bleeding.  He has not had any nosebleeds, hemoptysis, hematemesis, or hematuria.  He denies any aspirin or NSAID use.  He had a recent similar admission 12/02/2020-12/04/2020 at which time his hemoglobin was 6.0 on presentation and improved 8.3 after receiving 2 unit PRBC transfusion.  FOBT was negative at that time.  ED Course:  Initial vitals showed BP 138/61, pulse 72, RR 16, temp 98.2 F, SPO2 100% on room air.  Labs show hemoglobin 6.0 (baseline ~8), hematocrit 18.2, MCV 98.9, RDW 13.5, platelets 185,000, WBC 5.5, sodium 134, potassium 3.6, bicarb 23, BUN 26, creatinine 3.08, serum glucose 118.  FOBT is positive.  SARS-CoV-2 PCR collected and pending.  EDP discussed with on-call [facility] who will follow.  Patient was given IV Protonix 40 mg once and ordered to receive 2 unit PRBC transfusion.  The hospitalist service was consulted to admit for further evaluation and management.  Review of Systems: All systems reviewed and are negative except as  documented in history of present illness above.   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   Mallory-Weiss tear    OA (osteoarthritis)    hands   OSA (obstructive sleep apnea)    No longer uses CPAP   Persistent atrial fibrillation with rapid ventricular response ([place]) 02/05/2020   Recurrent syncope    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[facility])     Past Surgical History:  Procedure Laterality Date   ANKLE SURGERY Left 2014   had rods put in    BIOPSY  02/28/2019   Procedure: BIOPSY;  Surgeon: Milus Banister, MD;  Location: [place];  Service: Endoscopy;;   CARDIOVERSION  02/06/2020   CARDIOVERSION N/A 02/06/2020   Procedure: CARDIOVERSION;  Surgeon: Werner Lean, MD;  Location: [place] ENDOSCOPY;  Service: Cardiovascular;  Laterality: N/A;   CARDIOVERSION N/A 02/24/2020   Procedure: CARDIOVERSION;  Surgeon: Werner Lean, MD;  Location: [facility] ENDOSCOPY;  Service: Cardiovascular;  Laterality: N/A;   COLONOSCOPY  [place]   ESOPHAGOGASTRODUODENOSCOPY (EGD) WITH PROPOFOL N/A 02/28/2019   Procedure: ESOPHAGOGASTRODUODENOSCOPY (EGD) WITH PROPOFOL;  Surgeon: Milus Banister, MD;  Location: [facility] ENDOSCOPY;  Service: Endoscopy;  Laterality: N/A;   HIP ARTHROPLASTY Left 04/01/2016   Procedure: ARTHROPLASTY BIPOLAR HIP (HEMIARTHROPLASTY);  Surgeon: Paralee Cancel, MD;  Location: [place];  Service: Orthopedics;  Laterality: Left;   HIP CLOSED REDUCTION Left 04/30/2016   Procedure: CLOSED REDUCTION HIP;   Surgeon: Wylene Simmer, MD;  Location: [place];  Service: Orthopedics;  Laterality: Left;   IR RADIOLOGIST EVAL & MGMT  12/31/2020   TONSILLECTOMY     TOTAL HIP ARTHROPLASTY Right 10/15/2019   Procedure: TOTAL HIP ARTHROPLASTY ANTERIOR APPROACH;  Surgeon: Paralee Cancel, MD;  Location: [facility];  Service: Orthopedics;  Laterality: Right;  70 mins    Social History:  reports that he has quit smoking. His smoking use included cigarettes. He has a 20.00 pack-year smoking history. He has never used smokeless tobacco. He reports that he does not currently use alcohol. He reports that he does not use drugs.  Allergies  Allergen Reactions   Penicillin G Sodium     Other reaction(s): rash   Penicillins Other (See Comments)    Tolerated Cephalosporin Date: 10/15/19. Allergy is from childhood Did it involve swelling of the face/tongue/throat, SOB, or low BP? Unknown Did it involve sudden or severe rash/hives, skin peeling, or any reaction on the inside of your mouth or nose? Unknown Did you need to seek medical attention at a hospital or doctor's office? Unknown When did it last happen?    childhood reaction   If all above answers are "NO", may proceed with cephalosporin use.   Penicillins     Family History  Problem Relation Age of Onset   Heart disease Mother 90   Hypertension Mother    Arthritis Mother    Heart failure Father 65   Stroke Maternal Aunt    Heart failure Sister    Colon cancer Neg Hx    Esophageal cancer Neg Hx    Stomach cancer Neg Hx    Rectal cancer Neg Hx      Prior to Admission medications   Medication Sig Start Date End Date Taking? Authorizing Provider  acetaminophen (TYLENOL) 325 MG tablet Take 2 tablets (650 mg total) by mouth every 4 (four) hours as needed for headache or mild pain. 03/10/20   Angiulli, Lavon Paganini, PA-C  amiodarone (PACERONE) 200 MG tablet Take 1 tablet (200 mg total) by mouth daily. 04/10/20   Dunn, Nedra Hai, PA-C  amLODipine (NORVASC) 5 MG tablet Take  1 tablet (5 mg total) by mouth daily. 09/14/20   Caren Griffins, MD  apixaban (ELIQUIS) 2.5 MG TABS tablet Take 1 tablet (2.5 mg total) by mouth 2 (two) times daily. 12/03/20   Aline August, MD  atorvastatin (LIPITOR) 20 MG tablet Take 1 tablet (20 mg total) by mouth daily. 03/10/20   Angiulli, Lavon Paganini, PA-C  escitalopram (LEXAPRO) 10 MG tablet Take 1 tablet (10 mg total) by mouth daily. 03/10/20   Angiulli, Lavon Paganini, PA-C  ferrous sulfate 325 (65 FE) MG tablet Take 1 tablet (325 mg total) by mouth daily. 12/03/20 01/02/21  Aline August, MD  folic acid (FOLVITE) 1 MG tablet Take 1 tablet (1 mg total) by mouth daily. 03/10/20   Angiulli, Lavon Paganini, PA-C  ketoconazole (NIZORAL) 2 % cream Apply 1 application topically daily. To dry scaly skin on feet 07/23/20   Criselda Peaches, DPM  loratadine (CLARITIN) 10 MG tablet Take 1 tablet (10 mg total) by mouth daily. 02/12/20   Medina-Vargas, Monina C, NP  Magnesium Oxide 400 MG CAPS Take 1 capsule (400 mg total) by mouth daily. 10/17/20   Eugenie Filler, MD  methocarbamol (ROBAXIN) 500 MG tablet Take 1 tablet (500 mg total) by mouth every 6 (six) hours as needed for muscle spasms. 12/03/20   Aline August, MD  Multiple Vitamins-Minerals (CENTRUM SILVER 50+MEN) TABS Take 1 tablet by mouth daily with breakfast.    [provider]  nitroGLYCERIN (NITROSTAT) 0.4 MG SL tablet Place 1 tablet (0.4 mg total) under the tongue every 5 (five) minutes x 3 doses as needed for chest pain. 07/28/20   Baldwin Jamaica, PA-C  pantoprazole (PROTONIX) 40 MG tablet Take 1 tablet (40 mg total) by mouth daily. 03/10/20   Angiulli, Lavon Paganini, PA-C  potassium chloride SA (KLOR-CON) 20 MEQ tablet Take 1 tablet (20 mEq total) by mouth daily. 10/17/20 10/12/21  Eugenie Filler, MD  torsemide (DEMADEX) 20 MG tablet Take 60 mg by mouth See admin instructions. TAKE 3 TABLETS BY MOUTH EVERY MORNING, MAY TAKE A SECOND DOSE OF 3 TABLETS IN THE AFTERNOON AS NEEDED FOR SWELLING     [provider]  traZODone (DESYREL) 50 MG tablet Take 1 tablet (50 mg total) by mouth at bedtime. 03/10/20   Cathlyn Parsons, PA-C    Physical Exam: Vitals:   01/06/21 2200 01/06/21 2212 01/06/21 2215 01/06/21 2224  BP: (!) 174/71  (!) 179/77   Pulse: (!) 125 74  80  Resp: (!) 21 20 (!) 21 16  Temp:      TempSrc:      SpO2: (!) 81% 99%  100%  Weight:      Height:       Constitutional: Elderly man resting in bed, NAD, calm, comfortable Eyes: PERRL, lids and conjunctivae normal ENMT: Mucous membranes are moist. Posterior pharynx clear of any exudate or lesions.Normal dentition.  Neck: normal, supple, no masses. Respiratory: clear to auscultation bilaterally, no wheezing, no crackles. Normal respiratory effort. No accessory muscle use.  Cardiovascular: Regular rate and rhythm, no murmurs / rubs / gallops.  +2 bilateral lower extremity edema with stasis dermatitis changes. 2+ pedal pulses. Abdomen: no tenderness, no masses palpated. No hepatosplenomegaly. Bowel sounds positive.  Musculoskeletal: no clubbing / cyanosis. No joint deformity upper and lower extremities. Good ROM, no contractures. Normal muscle tone.  Skin: Stasis dermatitis changes both lower extremities, left lateral first toe circular wound with fat layer exposed, no purulent discharge. Neurologic: CN 2-12 grossly intact. Sensation intact. Strength 5/5 in all 4.  Psychiatric: Normal judgment and insight. Alert and oriented x 3. Normal mood.   Labs on Admission: I have personally reviewed following labs and imaging studies  CBC: Recent Labs  Lab 01/06/21 1846  WBC 5.5  NEUTROABS 3.8  HGB 6.0*  HCT 18.2*  MCV 98.9  PLT 035   Basic Metabolic Panel: Recent Labs  Lab 01/06/21 1846  NA 134*  K 3.6  CL 101  CO2 23  GLUCOSE 118*  BUN 26*  CREATININE 3.08*  CALCIUM 8.4*   GFR: Estimated Creatinine Clearance: 24.7 mL/min (A) (by C-G formula based on SCr of 3.08 mg/dL (H)). Liver Function  Tests: Recent Labs  Lab 01/06/21 1846  AST 25  ALT 13  ALKPHOS 64  BILITOT 0.2*  PROT 5.5*  ALBUMIN 3.0*   No results for input(s): LIPASE, AMYLASE in the last 168 hours.  No results for input(s): AMMONIA in the last 168 hours. Coagulation Profile: Recent Labs  Lab 01/06/21 1846  INR 1.1   Cardiac Enzymes: No results for input(s): CKTOTAL, CKMB, CKMBINDEX, TROPONINI in the last 168 hours. BNP (last 3 results) No results for input(s): PROBNP in the last 8760 hours. HbA1C: No results for input(s): HGBA1C in the last 72 hours. CBG: No results for input(s): GLUCAP in the last 168 hours. Lipid Profile: No results for input(s): CHOL, HDL, LDLCALC, TRIG, CHOLHDL, LDLDIRECT in the last 72 hours. Thyroid Function Tests: No results for input(s): TSH, T4TOTAL, FREET4, T3FREE, THYROIDAB in the last 72 hours. Anemia Panel: No results for input(s): VITAMINB12, FOLATE, FERRITIN, TIBC, IRON, RETICCTPCT in the last 72 hours. Urine analysis:    Component Value Date/Time   COLORURINE YELLOW 12/02/2020 [place] 12/02/2020 1206   LABSPEC 1.009 12/02/2020 1206   PHURINE 5.0 12/02/2020 1206   GLUCOSEU NEGATIVE 12/02/2020 1206   HGBUR LARGE (A) 12/02/2020 1206   BILIRUBINUR NEGATIVE 12/02/2020 1206   KETONESUR NEGATIVE 12/02/2020 1206   PROTEINUR 100 (A) 12/02/2020 1206   NITRITE NEGATIVE 12/02/2020 1206   LEUKOCYTESUR NEGATIVE 12/02/2020 1206    Radiological Exams on Admission: No results found.  EKG: Not performed.  Assessment/Plan Principal Problem:   Acute GI bleeding Active Problems:   Hypertension   Hyperlipidemia   Anemia   Paroxysmal atrial fibrillation (HCC)   Skin ulcer of left great toe (HCC)   CKD (chronic kidney disease) stage 4, GFR 15-29 ml/min (HCC)   Chronic heart failure with preserved ejection fraction (HFpEF) (HCC)   ANNETTE LIOTTA is a 80 y.o. male with medical history significant for paroxysmal atrial fibrillation on Eliquis, chronic  diastolic CHF, CKD stage IV, tachycardia-bradycardia syndrome, PAD with nonhealing left foot wounds, HTN, HLD, history of GI bleed, and mood disorder who is admitted with acute GI bleed.  Acute on chronic anemia due to GI bleed: Presenting with hemoglobin 6.0 on admission.  FOBT is positive.  Eliquis last taken 8/30 p.m. Suspect possible upper GI bleed. -Transfusing 2 unit PRBCs -Continue IV Protonix 40 mg twice daily -Keep n.p.o. after midnight -Gastroenterology to see  Paroxysmal atrial fibrillation: In sinus rhythm with controlled rate on admission.  Holding Eliquis as above.  Continue amiodarone.  Not on rate controlling medications due to history of symptomatic bradycardia.  CKD stage IV: Appears at baseline.  Continue to monitor.  Chronic diastolic CHF: Last EF 41-74% by TTE 09/08/2020.  Chronic appearing bilateral lower extremity edema, otherwise compensated. -Continue home torsemide 60 mg every morning -Continue potassium supplement, monitor strict I/O's and daily weights  Hypertension: Continue amlodipine and torsemide.  PAD with nonhealing left foot wounds: Follows with podiatry, Dr. Posey Pronto.  Seen by IR, Dr. Earleen Newport, who are planning for angiogram with potential intervention. -Continue wound care -Continue atorvastatin  Hyperlipidemia: Continue atorvastatin.  Mood disorder: Continue Lexapro.  DVT prophylaxis: SCDs Code Status: DNR, confirmed with patient on admission Family Communication: Discussed with patient, he has discussed with family Disposition Plan: From independent living facility and likely return to same environment pending clinical progress Consults called: Gastroenterology Level of care: Telemetry Medical Admission status:  Status is: Observation  The patient remains OBS appropriate and will d/c before 2 midnights.  Dispo: The patient is from:  Independent living facility              Anticipated d/c is to:  Independent living facility  Patient currently is not medically stable to d/c.  Zada Finders MD [facility]  If 7PM-7AM, please contact night-coverage [email or web address]  01/06/2021, 11:01 PM

## 2021-01-07 DIAGNOSIS — L97529 Non-pressure chronic ulcer of other part of left foot with unspecified severity: Secondary | ICD-10-CM | POA: Diagnosis present

## 2021-01-07 DIAGNOSIS — K5521 Angiodysplasia of colon with hemorrhage: Secondary | ICD-10-CM | POA: Diagnosis present

## 2021-01-07 DIAGNOSIS — K5520 Angiodysplasia of colon without hemorrhage: Secondary | ICD-10-CM | POA: Diagnosis not present

## 2021-01-07 DIAGNOSIS — I130 Hypertensive heart and chronic kidney disease with heart failure and stage 1 through stage 4 chronic kidney disease, or unspecified chronic kidney disease: Secondary | ICD-10-CM | POA: Diagnosis present

## 2021-01-07 DIAGNOSIS — E876 Hypokalemia: Secondary | ICD-10-CM | POA: Diagnosis present

## 2021-01-07 DIAGNOSIS — F329 Major depressive disorder, single episode, unspecified: Secondary | ICD-10-CM | POA: Diagnosis present

## 2021-01-07 DIAGNOSIS — K644 Residual hemorrhoidal skin tags: Secondary | ICD-10-CM | POA: Diagnosis not present

## 2021-01-07 DIAGNOSIS — Z6832 Body mass index (BMI) 32.0-32.9, adult: Secondary | ICD-10-CM | POA: Diagnosis not present

## 2021-01-07 DIAGNOSIS — K922 Gastrointestinal hemorrhage, unspecified: Secondary | ICD-10-CM | POA: Diagnosis not present

## 2021-01-07 DIAGNOSIS — I480 Paroxysmal atrial fibrillation: Secondary | ICD-10-CM | POA: Diagnosis not present

## 2021-01-07 DIAGNOSIS — E669 Obesity, unspecified: Secondary | ICD-10-CM | POA: Diagnosis present

## 2021-01-07 DIAGNOSIS — K319 Disease of stomach and duodenum, unspecified: Secondary | ICD-10-CM | POA: Diagnosis present

## 2021-01-07 DIAGNOSIS — E11621 Type 2 diabetes mellitus with foot ulcer: Secondary | ICD-10-CM | POA: Diagnosis present

## 2021-01-07 DIAGNOSIS — Z66 Do not resuscitate: Secondary | ICD-10-CM | POA: Diagnosis present

## 2021-01-07 DIAGNOSIS — I5032 Chronic diastolic (congestive) heart failure: Secondary | ICD-10-CM | POA: Diagnosis present

## 2021-01-07 DIAGNOSIS — N184 Chronic kidney disease, stage 4 (severe): Secondary | ICD-10-CM | POA: Diagnosis present

## 2021-01-07 DIAGNOSIS — E785 Hyperlipidemia, unspecified: Secondary | ICD-10-CM | POA: Diagnosis present

## 2021-01-07 DIAGNOSIS — I77810 Thoracic aortic ectasia: Secondary | ICD-10-CM | POA: Diagnosis present

## 2021-01-07 DIAGNOSIS — K648 Other hemorrhoids: Secondary | ICD-10-CM | POA: Diagnosis not present

## 2021-01-07 DIAGNOSIS — E1151 Type 2 diabetes mellitus with diabetic peripheral angiopathy without gangrene: Secondary | ICD-10-CM | POA: Diagnosis present

## 2021-01-07 DIAGNOSIS — F32A Depression, unspecified: Secondary | ICD-10-CM | POA: Diagnosis present

## 2021-01-07 DIAGNOSIS — I70245 Atherosclerosis of native arteries of left leg with ulceration of other part of foot: Secondary | ICD-10-CM | POA: Diagnosis present

## 2021-01-07 DIAGNOSIS — Z20822 Contact with and (suspected) exposure to covid-19: Secondary | ICD-10-CM | POA: Diagnosis present

## 2021-01-07 DIAGNOSIS — E1122 Type 2 diabetes mellitus with diabetic chronic kidney disease: Secondary | ICD-10-CM | POA: Diagnosis present

## 2021-01-07 DIAGNOSIS — K5730 Diverticulosis of large intestine without perforation or abscess without bleeding: Secondary | ICD-10-CM | POA: Diagnosis present

## 2021-01-07 DIAGNOSIS — D62 Acute posthemorrhagic anemia: Secondary | ICD-10-CM | POA: Diagnosis present

## 2021-01-07 DIAGNOSIS — K760 Fatty (change of) liver, not elsewhere classified: Secondary | ICD-10-CM | POA: Diagnosis present

## 2021-01-07 DIAGNOSIS — I4819 Other persistent atrial fibrillation: Secondary | ICD-10-CM | POA: Diagnosis present

## 2021-01-07 LAB — BASIC METABOLIC PANEL
Anion gap: 9 (ref 5–15)
BUN: 24 mg/dL — ABNORMAL HIGH (ref 8–23)
CO2: 26 mmol/L (ref 22–32)
Calcium: 8.7 mg/dL — ABNORMAL LOW (ref 8.9–10.3)
Chloride: 103 mmol/L (ref 98–111)
Creatinine, Ser: 2.84 mg/dL — ABNORMAL HIGH (ref 0.61–1.24)
GFR, Estimated: 22 mL/min — ABNORMAL LOW (ref >60)
Glucose, Bld: 104 mg/dL — ABNORMAL HIGH (ref 70–99)
Potassium: 3.2 mmol/L — ABNORMAL LOW (ref 3.5–5.1)
Sodium: 138 mmol/L (ref 135–145)

## 2021-01-07 LAB — CBC
HCT: 26.8 % — ABNORMAL LOW (ref 39.0–52.0)
Hemoglobin: 9.1 g/dL — ABNORMAL LOW (ref 13.0–17.0)
MCH: 31.8 pg (ref 26.0–34.0)
MCHC: 34 g/dL (ref 30.0–36.0)
MCV: 93.7 fL (ref 80.0–100.0)
Platelets: 158 10*3/uL (ref 150–400)
RBC: 2.86 MIL/uL — ABNORMAL LOW (ref 4.22–5.81)
RDW: 13.6 % (ref 11.5–15.5)
WBC: 5.4 10*3/uL (ref 4.0–10.5)
nRBC: 0 % (ref 0.0–0.2)

## 2021-01-07 LAB — IRON AND TIBC
Iron: 94 ug/dL (ref 45–182)
Saturation Ratios: 31 % (ref 17.9–39.5)
TIBC: 307 ug/dL (ref 250–450)
UIBC: 213 ug/dL

## 2021-01-07 LAB — SARS CORONAVIRUS 2 (TAT 6-24 HRS): SARS Coronavirus 2: NEGATIVE

## 2021-01-07 LAB — FERRITIN: Ferritin: 50 ng/mL (ref 24–336)

## 2021-01-07 MED ORDER — POTASSIUM CHLORIDE 10 MEQ/100ML IV SOLN
10.0000 meq | INTRAVENOUS | Status: AC
  Administered 2021-01-07 (×4): 10 meq via INTRAVENOUS
  Filled 2021-01-07 (×4): qty 100

## 2021-01-07 MED ORDER — FUROSEMIDE 10 MG/ML IJ SOLN
20.0000 mg | Freq: Once | INTRAMUSCULAR | Status: AC
  Administered 2021-01-07: 20 mg via INTRAVENOUS
  Filled 2021-01-07: qty 2

## 2021-01-07 MED ORDER — PEG-KCL-NACL-NASULF-NA ASC-C 100 G PO SOLR
0.5000 | Freq: Once | ORAL | Status: AC
  Administered 2021-01-08: 100 g via ORAL

## 2021-01-07 MED ORDER — HYDRALAZINE HCL 20 MG/ML IJ SOLN: 10.0000 mg | Freq: Four times a day (QID) | INTRAMUSCULAR | Status: DC | PRN

## 2021-01-07 MED ORDER — CARVEDILOL 6.25 MG PO TABS
6.2500 mg | ORAL_TABLET | Freq: Two times a day (BID) | ORAL | Status: DC
  Administered 2021-01-07 – 2021-01-09 (×5): 6.25 mg via ORAL
  Filled 2021-01-07: qty 1
  Filled 2021-01-07: qty 2
  Filled 2021-01-07 (×3): qty 1

## 2021-01-07 MED ORDER — PEG-KCL-NACL-NASULF-NA ASC-C 100 G PO SOLR: 1.0000 | Freq: Once | ORAL | Status: DC

## 2021-01-07 MED ORDER — POTASSIUM CHLORIDE CRYS ER 20 MEQ PO TBCR
20.0000 meq | EXTENDED_RELEASE_TABLET | Freq: Every day | ORAL | Status: DC
  Filled 2021-01-07: qty 1

## 2021-01-07 MED ORDER — TORSEMIDE 20 MG PO TABS
60.0000 mg | ORAL_TABLET | Freq: Every morning | ORAL | Status: DC
  Filled 2021-01-07: qty 3

## 2021-01-07 MED ORDER — PEG-KCL-NACL-NASULF-NA ASC-C 100 G PO SOLR
0.5000 | Freq: Once | ORAL | Status: AC
  Administered 2021-01-07: 100 g via ORAL
  Filled 2021-01-07: qty 1

## 2021-01-07 NOTE — Progress Notes (Signed)
PROGRESS NOTE                                                                                                                                                                                                             Patient Demographics:    Darryl Diaz, is a 80 y.o. male, DOB - December 24, 1940, IRJ:[id number]  Outpatient Primary MD for the patient is Tisovec, [name], MD    LOS - 0  Admit date - 01/06/2021    Chief Complaint  Patient presents with   Abnormal Lab       Brief Narrative (HPI from H&P) - Darryl Diaz is a 80 y.o. male with medical history significant for paroxysmal atrial fibrillation on Eliquis, chronic diastolic CHF, CKD stage IV, tachycardia-bradycardia syndrome, PAD with nonhealing left foot wounds, HTN, HLD, history of GI bleed, and mood disorder who presented to the ED for evaluation of anemia,    Subjective:    Darryl Diaz today has, No headache, No chest pain, No abdominal pain - No Nausea, No new weakness tingling or numbness, no SOB.   Assessment  & Plan :     Acute on chronic anemia most likely due to upper GI bleed.  Hemoccult positive stool - he is s/p, n.p.o. on IV PPI, GI to see likely will undergo endoscopic evaluation.  We will continue to monitor H&H and hold Eliquis.  2.  Paroxysmal atrial fibrillation.  CHA2DS2-VASc 2 score of greater than 3.  On amiodarone along with Eliquis, Eliquis held due to GI bleed.  3.  Chronic diastolic CHF EF 30% on echocardiogram done in May 2020 - he has chronic 3+ lower extremity edema, diabetes and monitor.  Cardiology also consulted.  4.  Chronic left Big Toe plantar aspect outpatient follow-up with podiatry.  No acute issues.  5.  Hypertension.  Blood pressure elevated add Coreg and monitor.  6.  Hypokalemia.  Replaced.  7.  PAD.  Currently on statin, continue for secondary prevention outpatient follow-up vascular  8.  Dyslipidemia.  On  statin.  9.  Depression stable with Lexapro.  10.  CKD stage IV Baseline creatinine close to 3.  Stable monitor.         Condition - Extremely Guarded  Family Communication  :  none present  Code Status :  DNR  Consults  :  GI, Cards  PUD Prophylaxis : PPI   Procedures  :           Disposition Plan  :    Status is: Observation  Dispo: The patient is from: Home              Anticipated d/c is to: Home              Patient currently is not medically stable to d/c.   Difficult to place patient No   DVT Prophylaxis  :    SCDs Start: 01/06/21 2258  Lab Results  Component Value Date   PLT 158 01/07/2021    Diet :  Diet Order             Diet NPO time specified  Diet effective midnight                    Inpatient Medications  Scheduled Meds:  amiodarone  200 mg Oral Q lunch   amLODipine  5 mg Oral Daily   atorvastatin  20 mg Oral Q lunch   escitalopram  10 mg Oral Q lunch   pantoprazole (PROTONIX) IV  40 mg Intravenous Q12H   [START ON 01/08/2021] potassium chloride SA  20 mEq Oral Q lunch   sodium chloride flush  3 mL Intravenous Q12H   [START ON 01/08/2021] torsemide  60 mg Oral q morning   Continuous Infusions:  sodium chloride Stopped (01/07/21 0057)   potassium chloride 10 mEq (01/07/21 1044)   PRN Meds:.acetaminophen **OR** [DISCONTINUED] acetaminophen, hydrALAZINE, [DISCONTINUED] ondansetron **OR** ondansetron (ZOFRAN) IV, senna-docusate  Antibiotics  :    Anti-infectives (From admission, onward)    None        Time Spent in minutes  30   Lala Lund M.D on 01/07/2021 at 10:48 AM  To page go to [email or web address]   [facility] -  Office  [phone] See all Orders from today for further details    Objective:   Vitals:   01/07/21 0530 01/07/21 0715 01/07/21 0718 01/07/21 1025  BP: (!) 154/79 (!) 146/116 (!) 166/70 (!) 152/77  Pulse: 73 79 72 (!) 134  Resp: (!) 22 16 19  (!) 21  Temp:   99.4 F (37.4 C)   TempSrc:       SpO2: 100% 99% 100% 100%  Weight:      Height:        Wt Readings from Last 3 Encounters:  01/06/21 108 kg  12/03/20 108.9 kg  10/07/20 113.4 kg     Intake/Output Summary (Last 24 hours) at 01/07/2021 1048 Last data filed at 01/07/2021 0726 Gross per 24 hour  Intake --  Output 2200 ml  Net -2200 ml     Physical Exam  Awake Alert, No new F.N deficits, Normal affect [place].AT,PERRAL Supple Neck,No JVD, No cervical lymphadenopathy appriciated.  Symmetrical Chest wall movement, Good air movement bilaterally, CTAB RRR,No Gallops,Rubs or new Murmurs, No Parasternal Heave +ve B.Sounds, Abd Soft, No tenderness, No organomegaly appriciated, No rebound - guarding or rigidity. No Cyanosis, 3 leg edema left more than right, on the L. Big toe plantar aspect small 1/2 cm shallow ulcer - no cellulitis   Data Review:    CBC Recent Labs  Lab 01/06/21 1846 01/07/21 0557  WBC 5.5 5.4  HGB 6.0* 9.1*  HCT 18.2* 26.8*  PLT 185 158  MCV 98.9 93.7  MCH 32.6 31.8  MCHC 33.0 34.0  RDW 13.5 13.6  LYMPHSABS 1.0  --  MONOABS 0.6  --   EOSABS 0.0  --   BASOSABS 0.0  --     Recent Labs  Lab 01/06/21 1846 01/07/21 0557  NA 134* 138  K 3.6 3.2*  CL 101 103  CO2 23 26  GLUCOSE 118* 104*  BUN 26* 24*  CREATININE 3.08* 2.84*  CALCIUM 8.4* 8.7*  AST 25  --   ALT 13  --   ALKPHOS 64  --   BILITOT 0.2*  --   ALBUMIN 3.0*  --   INR 1.1  --     ------------------------------------------------------------------------------------------------------------------ No results for input(s): CHOL, HDL, LDLCALC, TRIG, CHOLHDL, LDLDIRECT in the last 72 hours.  Lab Results  Component Value Date   HGBA1C 4.4 (L) 09/07/2020   ------------------------------------------------------------------------------------------------------------------ No results for input(s): TSH, T4TOTAL, T3FREE, THYROIDAB in the last 72 hours.  Invalid input(s): FREET3  Cardiac Enzymes No results for input(s): CKMB,  TROPONINI, MYOGLOBIN in the last 168 hours.  Invalid input(s): CK ------------------------------------------------------------------------------------------------------------------    Component Value Date/Time   BNP 710.7 (H) 09/08/2020 1953    Radiology Reports IR Radiologist Eval & Mgmt  Result Date: 12/31/2020 Please refer to notes tab for details about interventional procedure. (Op Note)

## 2021-01-07 NOTE — ED Notes (Signed)
Attempted to call report

## 2021-01-07 NOTE — H&P (View-Only) (Signed)
Referring Provider:  EDP Primary Care Physician:  [name], MD Primary Gastroenterologist:  Dr. Carlean Purl  Reason for Consultation:  Anemia and heme positive stools  HPI: Darryl Diaz is a 80 y.o. male with medical history significant for paroxysmal atrial fibrillation on Eliquis, chronic diastolic CHF, CKD stage IV, tachycardia-bradycardia syndrome, PAD with nonhealing left foot wounds, HTN, HLD, history of GI bleed, and mood disorder who presented to the ED for evaluation of anemia.  Patient states he had routine follow-up with his nephrologist, Dr. Joelyn Oms, morning of 01/06/2021.  Labs were obtained and he was notified that his hemoglobin level was low and advised to present to the ED for further evaluation.  Patient does take Eliquis twice a day with last dose taken evening of 8/30.  He says he does not really monitor his stools for obvious bleeding, but has not specifically noted any black or bloody stools.  He has not had any nosebleeds, hemoptysis, hematemesis, or hematuria.  He denies any aspirin or NSAID use, takes tylenol.   He had a recent similar admission 12/02/2020-12/04/2020 at which time his hemoglobin was 6.0 grams on presentation and improved 8.3 grams after receiving 2 unit PRBC transfusion.  FOBT was negative at that time.  He reports a couple of episodes of dry heaves over the past couple of months but no actual vomiting.  No abdominal pain, no dysphagia, no GERD symptoms.  Takes his pantoprazole daily at home.  On this occasion his Hgb was also 6.0 grams.  He has received 2 more units of PRBCs and it has increased to 9.1 grams.  He was heme positive on this occasion.  He has been evaluated for anemia in the past.  He had colonoscopy in April 2019 at which time he was found to have 2 polyps that were removed and were tubular adenomas.  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  He then had EGD in  October 2020 by Dr. 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  Biopsies showed reactive gastropathy, negative for H. pylori.  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[facility])    Cardiac arrest ([place]) 04/20/2019   12 min CPR with epi   Chronic diastolic CHF (congestive heart failure) (HCC)    Fatty liver    Gastritis    GERD (gastroesophageal reflux disease)    H/O seasonal allergies    History of GI bleed    Hx of adenomatous colonic polyps 08/10/2017   Hyperlipidemia    Hypertension    Insomnia    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   Mallory-Weiss tear    OA (osteoarthritis)    hands   OSA (obstructive sleep apnea)    No longer uses CPAP   Persistent atrial fibrillation with rapid ventricular response ([place]) 02/05/2020   Recurrent syncope    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[facility])     Past Surgical History:  Procedure Laterality Date   ANKLE SURGERY Left 2014   had rods put in    BIOPSY  02/28/2019   Procedure: BIOPSY;  Surgeon: Milus Banister, MD;  Location: [place];  Service: Endoscopy;;   CARDIOVERSION  02/06/2020   CARDIOVERSION N/A 02/06/2020   Procedure:  CARDIOVERSION;  Surgeon: Werner Lean, MD;  Location: [facility] ENDOSCOPY;  Service: Cardiovascular;  Laterality: N/A;   CARDIOVERSION N/A 02/24/2020   Procedure: CARDIOVERSION;  Surgeon: Werner Lean, MD;  Location: [place];  Service: Cardiovascular;  Laterality: N/A;   COLONOSCOPY     [place]   ESOPHAGOGASTRODUODENOSCOPY (EGD) WITH PROPOFOL N/A 02/28/2019   Procedure: ESOPHAGOGASTRODUODENOSCOPY (EGD) WITH PROPOFOL;  Surgeon: Milus Banister, MD;  Location: [facility] ENDOSCOPY;  Service: Endoscopy;  Laterality: N/A;   HIP ARTHROPLASTY Left 04/01/2016   Procedure: ARTHROPLASTY BIPOLAR  HIP (HEMIARTHROPLASTY);  Surgeon: Paralee Cancel, MD;  Location: [place];  Service: Orthopedics;  Laterality: Left;   HIP CLOSED REDUCTION Left 04/30/2016   Procedure: CLOSED REDUCTION HIP;  Surgeon: Wylene Simmer, MD;  Location: [place];  Service: Orthopedics;  Laterality: Left;   IR RADIOLOGIST EVAL & MGMT  12/31/2020   TONSILLECTOMY     TOTAL HIP ARTHROPLASTY Right 10/15/2019   Procedure: TOTAL HIP ARTHROPLASTY ANTERIOR APPROACH;  Surgeon: Paralee Cancel, MD;  Location: [facility];  Service: Orthopedics;  Laterality: Right;  70 mins    Prior to Admission medications   Medication Sig Start Date End Date Taking? Authorizing Provider  acetaminophen (TYLENOL) 325 MG tablet Take 2 tablets (650 mg total) by mouth every 4 (four) hours as needed for headache or mild pain. 03/10/20  Yes Angiulli, Lavon Paganini, PA-C  amiodarone (PACERONE) 200 MG tablet Take 1 tablet (200 mg total) by mouth daily. Patient taking differently: Take 200 mg by mouth daily with lunch. 04/10/20  Yes Dunn, Dayna N, PA-C  apixaban (ELIQUIS) 2.5 MG TABS tablet Take 1 tablet (2.5 mg total) by mouth 2 (two) times daily. 12/03/20  Yes Aline August, MD  atorvastatin (LIPITOR) 20 MG tablet Take 1 tablet (20 mg total) by mouth daily. Patient taking differently: Take 20 mg by mouth daily with lunch. 03/10/20  Yes Angiulli, Lavon Paganini, PA-C  escitalopram (LEXAPRO) 10 MG tablet Take 1 tablet (10 mg total) by mouth daily. Patient taking differently: Take 10 mg by mouth daily with lunch. 03/10/20  Yes Angiulli, Lavon Paganini, PA-C  ferrous sulfate 325 (65 FE) MG tablet Take 1 tablet (325 mg total) by mouth daily. Patient taking differently: Take 325 mg by mouth daily with lunch. 12/03/20 01/06/21 Yes Aline August, MD  folic acid (FOLVITE) 1 MG tablet Take 1 tablet (1 mg total) by mouth daily. Patient taking differently: Take 1 mg by mouth daily with lunch. 03/10/20  Yes Angiulli, Lavon Paganini, PA-C  ketoconazole (NIZORAL) 2 % cream Apply 1 application topically daily. To  dry scaly skin on feet Patient taking differently: Apply 1 application topically 2 (two) times daily as needed (dry scaly skin on feet). 07/23/20  Yes McDonald, Stephan Minister, DPM  loratadine (CLARITIN) 10 MG tablet Take 1 tablet (10 mg total) by mouth daily. Patient taking differently: Take 10 mg by mouth daily as needed for allergies. 02/12/20  Yes Medina-Vargas, Monina C, NP  Magnesium Oxide 400 MG CAPS Take 1 capsule (400 mg total) by mouth daily. Patient taking differently: Take 400 mg by mouth daily with lunch. 10/17/20  Yes Eugenie Filler, MD  methocarbamol (ROBAXIN) 500 MG tablet Take 1 tablet (500 mg total) by mouth every 6 (six) hours as needed for muscle spasms. 12/03/20  Yes Aline August, MD  Multiple Vitamins-Minerals (CENTRUM SILVER 50+MEN) TABS Take 1 tablet by mouth daily with breakfast.   Yes [provider]  nitroGLYCERIN (NITROSTAT) 0.4 MG SL tablet Place 1 tablet (0.4 mg  total) under the tongue every 5 (five) minutes x 3 doses as needed for chest pain. 07/28/20  Yes Baldwin Jamaica, PA-C  pantoprazole (PROTONIX) 40 MG tablet Take 1 tablet (40 mg total) by mouth daily. Patient taking differently: Take 40 mg by mouth daily with lunch. 03/10/20  Yes Angiulli, Lavon Paganini, PA-C  potassium chloride SA (KLOR-CON) 20 MEQ tablet Take 1 tablet (20 mEq total) by mouth daily. Patient taking differently: Take 20 mEq by mouth daily with lunch. 10/17/20 10/12/21 Yes Eugenie Filler, MD  torsemide (DEMADEX) 20 MG tablet Take 60 mg by mouth See admin instructions. 60 mg every morning, may take 60 mg in the afternoon as needed for swelling   Yes [provider]  traZODone (DESYREL) 50 MG tablet Take 1 tablet (50 mg total) by mouth at bedtime. 03/10/20  Yes Angiulli, Lavon Paganini, PA-C  amLODipine (NORVASC) 5 MG tablet Take 1 tablet (5 mg total) by mouth daily. Patient not taking: Reported on 01/06/2021 09/14/20   Caren Griffins, MD    Current Facility-Administered Medications  Medication  Dose Route Frequency Provider Last Rate Last Admin   0.9 %  sodium chloride infusion  10 mL/hr Intravenous Once Domenic Moras, PA-C   Held at 01/07/21 4196   acetaminophen (TYLENOL) tablet 650 mg  650 mg Oral Q6H PRN Lenore Cordia, MD       amiodarone (PACERONE) tablet 200 mg  200 mg Oral Q lunch Lenore Cordia, MD       amLODipine (NORVASC) tablet 5 mg  5 mg Oral Daily Zada Finders R, MD   5 mg at 01/07/21 0058   atorvastatin (LIPITOR) tablet 20 mg  20 mg Oral Q lunch Lenore Cordia, MD       escitalopram (LEXAPRO) tablet 10 mg  10 mg Oral Q lunch Lenore Cordia, MD       hydrALAZINE (APRESOLINE) injection 10 mg  10 mg Intravenous Q6H PRN Thurnell Lose, MD       ondansetron (ZOFRAN) injection 4 mg  4 mg Intravenous Q6H PRN Lenore Cordia, MD       pantoprazole (PROTONIX) injection 40 mg  40 mg Intravenous Q12H Patel, Vishal R, MD       potassium chloride 10 mEq in 100 mL IVPB  10 mEq Intravenous Q1 Hr x 4 Lala Lund K, MD 100 mL/hr at 01/07/21 0738 10 mEq at 01/07/21 0738   [START ON 01/08/2021] potassium chloride SA (KLOR-CON) CR tablet 20 mEq  20 mEq Oral Q lunch Thurnell Lose, MD       senna-docusate (Senokot-S) tablet 1 tablet  1 tablet Oral QHS PRN Lenore Cordia, MD       sodium chloride flush (NS) 0.9 % injection 3 mL  3 mL Intravenous Q12H Zada Finders R, MD   3 mL at 01/06/21 2348   [START ON 01/08/2021] torsemide (DEMADEX) tablet 60 mg  60 mg Oral q morning Thurnell Lose, MD       Current Outpatient Medications  Medication Sig Dispense Refill   acetaminophen (TYLENOL) 325 MG tablet Take 2 tablets (650 mg total) by mouth every 4 (four) hours as needed for headache or mild pain.     amiodarone (PACERONE) 200 MG tablet Take 1 tablet (200 mg total) by mouth daily. (Patient taking differently: Take 200 mg by mouth daily with lunch.) 90 tablet 3   apixaban (ELIQUIS) 2.5 MG TABS tablet Take 1 tablet (2.5 mg total) by mouth  2 (two) times daily. 60 tablet 0   atorvastatin  (LIPITOR) 20 MG tablet Take 1 tablet (20 mg total) by mouth daily. (Patient taking differently: Take 20 mg by mouth daily with lunch.) 30 tablet 0   escitalopram (LEXAPRO) 10 MG tablet Take 1 tablet (10 mg total) by mouth daily. (Patient taking differently: Take 10 mg by mouth daily with lunch.) 30 tablet 0   ferrous sulfate 325 (65 FE) MG tablet Take 1 tablet (325 mg total) by mouth daily. (Patient taking differently: Take 325 mg by mouth daily with lunch.)     folic acid (FOLVITE) 1 MG tablet Take 1 tablet (1 mg total) by mouth daily. (Patient taking differently: Take 1 mg by mouth daily with lunch.) 30 tablet 0   ketoconazole (NIZORAL) 2 % cream Apply 1 application topically daily. To dry scaly skin on feet (Patient taking differently: Apply 1 application topically 2 (two) times daily as needed (dry scaly skin on feet).) 60 g 2   loratadine (CLARITIN) 10 MG tablet Take 1 tablet (10 mg total) by mouth daily. (Patient taking differently: Take 10 mg by mouth daily as needed for allergies.) 30 tablet 0   Magnesium Oxide 400 MG CAPS Take 1 capsule (400 mg total) by mouth daily. (Patient taking differently: Take 400 mg by mouth daily with lunch.)     methocarbamol (ROBAXIN) 500 MG tablet Take 1 tablet (500 mg total) by mouth every 6 (six) hours as needed for muscle spasms. 30 tablet 0   Multiple Vitamins-Minerals (CENTRUM SILVER 50+MEN) TABS Take 1 tablet by mouth daily with breakfast.     nitroGLYCERIN (NITROSTAT) 0.4 MG SL tablet Place 1 tablet (0.4 mg total) under the tongue every 5 (five) minutes x 3 doses as needed for chest pain. 30 tablet 12   pantoprazole (PROTONIX) 40 MG tablet Take 1 tablet (40 mg total) by mouth daily. (Patient taking differently: Take 40 mg by mouth daily with lunch.) 30 tablet 0   potassium chloride SA (KLOR-CON) 20 MEQ tablet Take 1 tablet (20 mEq total) by mouth daily. (Patient taking differently: Take 20 mEq by mouth daily with lunch.) 90 tablet 3   torsemide (DEMADEX) 20 MG  tablet Take 60 mg by mouth See admin instructions. 60 mg every morning, may take 60 mg in the afternoon as needed for swelling     traZODone (DESYREL) 50 MG tablet Take 1 tablet (50 mg total) by mouth at bedtime. 30 tablet 0   amLODipine (NORVASC) 5 MG tablet Take 1 tablet (5 mg total) by mouth daily. (Patient not taking: Reported on 01/06/2021) 30 tablet 0   Facility-Administered Medications Ordered in Other Encounters  Medication Dose Route Frequency Provider Last Rate Last Admin   metoprolol tartrate (LOPRESSOR) injection    Anesthesia Intra-op Mariea Clonts, CRNA   5 mg at 02/27/19 1318    Allergies as of 01/06/2021 - Review Complete 01/06/2021  Allergen Reaction Noted   Penicillin g sodium  10/30/2020   Penicillins Other (See Comments) 03/30/2016   Penicillins  07/08/2020    Family History  Problem Relation Age of Onset   Heart disease Mother 72   Hypertension Mother    Arthritis Mother    Heart failure Father 47   Stroke Maternal Aunt    Heart failure Sister    Colon cancer Neg Hx    Esophageal cancer Neg Hx    Stomach cancer Neg Hx    Rectal cancer Neg Hx     Social History  Socioeconomic History   Marital status: Widowed    Spouse name: Not on file   Number of children: 2   Years of education: college   Highest education level: Not on file  Occupational History   Occupation: retired  Tobacco Use   Smoking status: Former    Packs/day: 1.00    Years: 20.00    Pack years: 20.00    Types: Cigarettes   Smokeless tobacco: Never   Tobacco comments:       quit 20 years  smoked on and off for 20 years  Vaping Use   Vaping Use: Never used  Substance and Sexual Activity   Alcohol use: Not Currently    Comment: h/o heavy use   Drug use: Never   Sexual activity: Not Currently  Other Topics Concern   Not on file  Social History Narrative   ** Merged History Encounter **       The patient is divorced w/ 1 son 1 daughter He is a retired Regulatory affairs officer, he  lived in [place] but [employer] including windows on the world in the [place] room in [place] He moved to [place] to be near his sister who liv   es in [place] alcoholic member of AA 2 caffeinated beverages daily prior smoker no drugs   Social Determinants of Radio broadcast assistant Strain: Low Risk    Difficulty of Paying Living Expenses: Not hard at [employer] Insecurity: No Food Insecurity   Worried About Charity fundraiser in the Last Year: Never true   Arboriculturist in the Last Year: Never true  Transportation Needs: No Transportation Needs   Lack of Transportation (Medical): No   Lack of Transportation (Non-Medical): No  Physical Activity: Not on file  Stress: No Stress Concern Present   Feeling of Stress : Not at all  Social Connections: Moderately Integrated   Frequency of Communication with Friends and Family: More than three times a week   Frequency of Social Gatherings with Friends and Family: More than three times a week   Attends Religious Services: 1 to 4 times per year   Active Member of [employer] or Organizations: No   Attends Archivist Meetings: 1 to 4 times per year   Marital Status: Widowed  Human resources officer Violence: Not At Risk   Fear of Current or Ex-Partner: No   Emotionally Abused: No   Physically Abused: No   Sexually Abused: No    Review of Systems: ROS is O/W negative except as mentioned in HPI.  Physical Exam: Vital signs in last 24 hours: Temp:  [97.7 F (36.5 C)-99.4 F (37.4 C)] 99.4 F (37.4 C) (09/01 0718) Pulse Rate:  [62-125] 72 (09/01 0718) Resp:  [11-29] 19 (09/01 0718) BP: (138-179)/(61-116) 166/70 (09/01 0718) SpO2:  [81 %-100 %] 100 % (09/01 0718) Weight:  [250 kg] 108 kg (08/31 1837)   General:  Alert, Well-developed, well-nourished, pleasant and cooperative in NAD Head:  Normocephalic and atraumatic. Eyes:  Sclera clear, no icterus.  Conjunctiva pink. Ears:  Normal  auditory acuity. Mouth:  No deformity or lesions.   Lungs:  Clear throughout to auscultation.  No wheezes, crackles, or rhonchi.  Heart:  Regular rate and rhythm; no murmurs, clicks, rubs, or gallops. Abdomen:  Soft, non-distended.  BS present.  Non-tender. Msk:  Symmetrical without gross deformities. Pulses:  Normal pulses noted. Extremities:  +2 bilateral lower extremity edema with stasis dermatitis changes Neurologic:  Alert and oriented x 4;  grossly normal neurologically. Skin:  Intact without significant lesions or rashes. Psych:  Alert and cooperative. Normal mood and affect.  Intake/Output from previous day: 08/31 0701 - 09/01 0700 In: -  Out: 1000 [Urine:1000] Intake/Output this shift: Total I/O In: -  Out: 1200 [Urine:1200]  Lab Results: Recent Labs    01/06/21 1846 01/07/21 0557  WBC 5.5 5.4  HGB 6.0* 9.1*  HCT 18.2* 26.8*  PLT 185 158   BMET Recent Labs    01/06/21 1846 01/07/21 0557  NA 134* 138  K 3.6 3.2*  CL 101 103  CO2 23 26  GLUCOSE 118* 104*  BUN 26* 24*  CREATININE 3.08* 2.84*  CALCIUM 8.4* 8.7*   LFT Recent Labs    01/06/21 1846  PROT 5.5*  ALBUMIN 3.0*  AST 25  ALT 13  ALKPHOS 64  BILITOT 0.2*   PT/INR Recent Labs    01/06/21 1846  LABPROT 14.4  INR 1.1   IMPRESSION:  *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  He has experienced no overt sign of GI bleeding.  Hgb was 6.0 grams and now up to 9.1 grams after 2 units of PRBCs.  He is heme positive.  He presented with a 6.0 gram Hgb last month as well but was heme negative at that time so was just transfused. *Atrial fibrillation on Eliquis:  This is currently on hold with last dose evening of 8/30.   PLAN: -EGD and colonoscopy 9/2. -Pantoprazole 40 mg IV BID for now as has been ordered. -Monitor Hgb and transfuse further prn.   Darryl Diaz  01/07/2021, 8:58  AM  ________________________________________________________________________  Velora Heckler GI MD note:  I personally examined the patient, reviewed the data and agree with the assessment and plan described above. Very pleasant 80 yo man on eliquis with heme + acute on chronic anemia.  Known to have cecal AVMs and EGD 2 years ago found esophagitis and gastritis.  He feels quite a bit better after 2 unit blood tranfusion and he understands I am planning Colonsocopy and EGD tomorrow for further evaluation/treatment of his heme + acute on chronic anemia.   Owens Loffler, MD [facility] Gastroenterology Pager [phone]

## 2021-01-07 NOTE — ED Notes (Signed)
Attempting to give report, nurse will call when ready

## 2021-01-07 NOTE — Consult Note (Addendum)
Referring Provider:  EDP Primary Care Physician:  [name], MD Primary Gastroenterologist:  Dr. Carlean Purl  Reason for Consultation:  Anemia and heme positive stools  HPI: Darryl Diaz is a 80 y.o. male with medical history significant for paroxysmal atrial fibrillation on Eliquis, chronic diastolic CHF, CKD stage IV, tachycardia-bradycardia syndrome, PAD with nonhealing left foot wounds, HTN, HLD, history of GI bleed, and mood disorder who presented to the ED for evaluation of anemia.  Patient states he had routine follow-up with his nephrologist, Dr. Joelyn Oms, morning of 01/06/2021.  Labs were obtained and he was notified that his hemoglobin level was low and advised to present to the ED for further evaluation.  Patient does take Eliquis twice a day with last dose taken evening of 8/30.  He says he does not really monitor his stools for obvious bleeding, but has not specifically noted any black or bloody stools.  He has not had any nosebleeds, hemoptysis, hematemesis, or hematuria.  He denies any aspirin or NSAID use, takes tylenol.   He had a recent similar admission 12/02/2020-12/04/2020 at which time his hemoglobin was 6.0 grams on presentation and improved 8.3 grams after receiving 2 unit PRBC transfusion.  FOBT was negative at that time.  He reports a couple of episodes of dry heaves over the past couple of months but no actual vomiting.  No abdominal pain, no dysphagia, no GERD symptoms.  Takes his pantoprazole daily at home.  On this occasion his Hgb was also 6.0 grams.  He has received 2 more units of PRBCs and it has increased to 9.1 grams.  He was heme positive on this occasion.  He has been evaluated for anemia in the past.  He had colonoscopy in April 2019 at which time he was found to have 2 polyps that were removed and were tubular adenomas.  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  He then had EGD in  October 2020 by Dr. 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  Biopsies showed reactive gastropathy, negative for H. pylori.  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   Anemia    Ascending aorta dilation (HCC)    Cardiac arrest ([place]) 04/20/2019   12 min CPR with epi   Chronic diastolic CHF (congestive heart failure) (HCC)    Fatty liver    Gastritis    GERD (gastroesophageal reflux disease)    H/O seasonal allergies    History of GI bleed    Hx of adenomatous colonic polyps 08/10/2017   Hyperlipidemia    Hypertension    Insomnia    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   Mallory-Weiss tear    OA (osteoarthritis)    hands   OSA (obstructive sleep apnea)    No longer uses CPAP   Persistent atrial fibrillation with rapid ventricular response ([place]) 02/05/2020   Recurrent syncope    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[facility])     Past Surgical History:  Procedure Laterality Date   ANKLE SURGERY Left 2014   had rods put in    BIOPSY  02/28/2019   Procedure: BIOPSY;  Surgeon: Milus Banister, MD;  Location: [place];  Service: Endoscopy;;   CARDIOVERSION  02/06/2020   CARDIOVERSION N/A 02/06/2020   Procedure:  CARDIOVERSION;  Surgeon: Werner Lean, MD;  Location: [facility] ENDOSCOPY;  Service: Cardiovascular;  Laterality: N/A;   CARDIOVERSION N/A 02/24/2020   Procedure: CARDIOVERSION;  Surgeon: Werner Lean, MD;  Location: [place];  Service: Cardiovascular;  Laterality: N/A;   COLONOSCOPY     [place]   ESOPHAGOGASTRODUODENOSCOPY (EGD) WITH PROPOFOL N/A 02/28/2019   Procedure: ESOPHAGOGASTRODUODENOSCOPY (EGD) WITH PROPOFOL;  Surgeon: Milus Banister, MD;  Location[facility] END[facility]COPY;  Service: Endoscopy;  Laterality: N/A;   HIP ARTHROPLASTY Left 04/01/2016   Procedure: ARTHROPLASTY BIPOLAR  HIP (HEMIARTHROPLASTY);  Surgeon: Paralee Cancel, MD;  Location: [place];  Service: Orthopedics;  Laterality: Left;   HIP CLOSED REDUCTION Left 04/30/2016   Procedure: CLOSED REDUCTION HIP;  Surgeon: Wylene Simmer, MD;  Location: [place];  Service: Orthopedics;  Laterality: Left;   IR RADIOLOGIST EVAL & MGMT  12/31/2020   TONSILLECTOMY     TOTAL HIP ARTHROPLASTY Right 10/15/2019   Procedure: TOTAL HIP ARTHROPLASTY ANTERIOR APPROACH;  Surgeon: Paralee Cancel, MD;  Location: [facility];  Service: Orthopedics;  Laterality: Right;  70 mins    Prior to Admission medications   Medication Sig Start Date End Date Taking? Authorizing Provider  acetaminophen (TYLENOL) 325 MG tablet Take 2 tablets (650 mg total) by mouth every 4 (four) hours as needed for headache or mild pain. 03/10/20  Yes Angiulli, Lavon Paganini, PA-C  amiodarone (PACERONE) 200 MG tablet Take 1 tablet (200 mg total) by mouth daily. Patient taking differently: Take 200 mg by mouth daily with lunch. 04/10/20  Yes Dunn, Dayna N, PA-C  apixaban (ELIQUIS) 2.5 MG TABS tablet Take 1 tablet (2.5 mg total) by mouth 2 (two) times daily. 12/03/20  Yes Aline August, MD  atorvastatin (LIPITOR) 20 MG tablet Take 1 tablet (20 mg total) by mouth daily. Patient taking differently: Take 20 mg by mouth daily with lunch. 03/10/20  Yes Angiulli, Lavon Paganini, PA-C  escitalopram (LEXAPRO) 10 MG tablet Take 1 tablet (10 mg total) by mouth daily. Patient taking differently: Take 10 mg by mouth daily with lunch. 03/10/20  Yes Angiulli, Lavon Paganini, PA-C  ferrous sulfate 325 (65 FE) MG tablet Take 1 tablet (325 mg total) by mouth daily. Patient taking differently: Take 325 mg by mouth daily with lunch. 12/03/20 01/06/21 Yes Aline August, MD  folic acid (FOLVITE) 1 MG tablet Take 1 tablet (1 mg total) by mouth daily. Patient taking differently: Take 1 mg by mouth daily with lunch. 03/10/20  Yes Angiulli, Lavon Paganini, PA-C  ketoconazole (NIZORAL) 2 % cream Apply 1 application topically daily. To  dry scaly skin on feet Patient taking differently: Apply 1 application topically 2 (two) times daily as needed (dry scaly skin on feet). 07/23/20  Yes McDonald, Stephan Minister, DPM  loratadine (CLARITIN) 10 MG tablet Take 1 tablet (10 mg total) by mouth daily. Patient taking differently: Take 10 mg by mouth daily as needed for allergies. 02/12/20  Yes Medina-Vargas, Monina C, NP  Magnesium Oxide 400 MG CAPS Take 1 capsule (400 mg total) by mouth daily. Patient taking differently: Take 400 mg by mouth daily with lunch. 10/17/20  Yes Eugenie Filler, MD  methocarbamol (ROBAXIN) 500 MG tablet Take 1 tablet (500 mg total) by mouth every 6 (six) hours as needed for muscle spasms. 12/03/20  Yes Aline August, MD  Multiple Vitamins-Minerals (CENTRUM SILVER 50+MEN) TABS Take 1 tablet by mouth daily with breakfast.   Yes [provider]  nitroGLYCERIN (NITROSTAT) 0.4 MG SL tablet Place 1 tablet (0.4 mg  total) under the tongue every 5 (five) minutes x 3 doses as needed for chest pain. 07/28/20  Yes Baldwin Jamaica, PA-C  pantoprazole (PROTONIX) 40 MG tablet Take 1 tablet (40 mg total) by mouth daily. Patient taking differently: Take 40 mg by mouth daily with lunch. 03/10/20  Yes Angiulli, Lavon Paganini, PA-C  potassium chloride SA (KLOR-CON) 20 MEQ tablet Take 1 tablet (20 mEq total) by mouth daily. Patient taking differently: Take 20 mEq by mouth daily with lunch. 10/17/20 10/12/21 Yes Eugenie Filler, MD  torsemide (DEMADEX) 20 MG tablet Take 60 mg by mouth See admin instructions. 60 mg every morning, may take 60 mg in the afternoon as needed for swelling   Yes [provider]  traZODone (DESYREL) 50 MG tablet Take 1 tablet (50 mg total) by mouth at bedtime. 03/10/20  Yes Angiulli, Lavon Paganini, PA-C  amLODipine (NORVASC) 5 MG tablet Take 1 tablet (5 mg total) by mouth daily. Patient not taking: Reported on 01/06/2021 09/14/20   Caren Griffins, MD    Current Facility-Administered Medications  Medication  Dose Route Frequency Provider Last Rate Last Admin   0.9 %  sodium chloride infusion  10 mL/hr Intravenous Once Domenic Moras, PA-C   Held at 01/07/21 3875   acetaminophen (TYLENOL) tablet 650 mg  650 mg Oral Q6H PRN Lenore Cordia, MD       amiodarone (PACERONE) tablet 200 mg  200 mg Oral Q lunch Lenore Cordia, MD       amLODipine (NORVASC) tablet 5 mg  5 mg Oral Daily Zada Finders R, MD   5 mg at 01/07/21 0058   atorvastatin (LIPITOR) tablet 20 mg  20 mg Oral Q lunch Lenore Cordia, MD       escitalopram (LEXAPRO) tablet 10 mg  10 mg Oral Q lunch Lenore Cordia, MD       hydrALAZINE (APRESOLINE) injection 10 mg  10 mg Intravenous Q6H PRN Thurnell Lose, MD       ondansetron (ZOFRAN) injection 4 mg  4 mg Intravenous Q6H PRN Lenore Cordia, MD       pantoprazole (PROTONIX) injection 40 mg  40 mg Intravenous Q12H Patel, Vishal R, MD       potassium chloride 10 mEq in 100 mL IVPB  10 mEq Intravenous Q1 Hr x 4 Lala Lund K, MD 100 mL/hr at 01/07/21 0738 10 mEq at 01/07/21 0738   [START ON 01/08/2021] potassium chloride SA (KLOR-CON) CR tablet 20 mEq  20 mEq Oral Q lunch Thurnell Lose, MD       senna-docusate (Senokot-S) tablet 1 tablet  1 tablet Oral QHS PRN Lenore Cordia, MD       sodium chloride flush (NS) 0.9 % injection 3 mL  3 mL Intravenous Q12H Zada Finders R, MD   3 mL at 01/06/21 2348   [START ON 01/08/2021] torsemide (DEMADEX) tablet 60 mg  60 mg Oral q morning Thurnell Lose, MD       Current Outpatient Medications  Medication Sig Dispense Refill   acetaminophen (TYLENOL) 325 MG tablet Take 2 tablets (650 mg total) by mouth every 4 (four) hours as needed for headache or mild pain.     amiodarone (PACERONE) 200 MG tablet Take 1 tablet (200 mg total) by mouth daily. (Patient taking differently: Take 200 mg by mouth daily with lunch.) 90 tablet 3   apixaban (ELIQUIS) 2.5 MG TABS tablet Take 1 tablet (2.5 mg total) by mouth  2 (two) times daily. 60 tablet 0   atorvastatin  (LIPITOR) 20 MG tablet Take 1 tablet (20 mg total) by mouth daily. (Patient taking differently: Take 20 mg by mouth daily with lunch.) 30 tablet 0   escitalopram (LEXAPRO) 10 MG tablet Take 1 tablet (10 mg total) by mouth daily. (Patient taking differently: Take 10 mg by mouth daily with lunch.) 30 tablet 0   ferrous sulfate 325 (65 FE) MG tablet Take 1 tablet (325 mg total) by mouth daily. (Patient taking differently: Take 325 mg by mouth daily with lunch.)     folic acid (FOLVITE) 1 MG tablet Take 1 tablet (1 mg total) by mouth daily. (Patient taking differently: Take 1 mg by mouth daily with lunch.) 30 tablet 0   ketoconazole (NIZORAL) 2 % cream Apply 1 application topically daily. To dry scaly skin on feet (Patient taking differently: Apply 1 application topically 2 (two) times daily as needed (dry scaly skin on feet).) 60 g 2   loratadine (CLARITIN) 10 MG tablet Take 1 tablet (10 mg total) by mouth daily. (Patient taking differently: Take 10 mg by mouth daily as needed for allergies.) 30 tablet 0   Magnesium Oxide 400 MG CAPS Take 1 capsule (400 mg total) by mouth daily. (Patient taking differently: Take 400 mg by mouth daily with lunch.)     methocarbamol (ROBAXIN) 500 MG tablet Take 1 tablet (500 mg total) by mouth every 6 (six) hours as needed for muscle spasms. 30 tablet 0   Multiple Vitamins-Minerals (CENTRUM SILVER 50+MEN) TABS Take 1 tablet by mouth daily with breakfast.     nitroGLYCERIN (NITROSTAT) 0.4 MG SL tablet Place 1 tablet (0.4 mg total) under the tongue every 5 (five) minutes x 3 doses as needed for chest pain. 30 tablet 12   pantoprazole (PROTONIX) 40 MG tablet Take 1 tablet (40 mg total) by mouth daily. (Patient taking differently: Take 40 mg by mouth daily with lunch.) 30 tablet 0   potassium chloride SA (KLOR-CON) 20 MEQ tablet Take 1 tablet (20 mEq total) by mouth daily. (Patient taking differently: Take 20 mEq by mouth daily with lunch.) 90 tablet 3   torsemide (DEMADEX) 20 MG  tablet Take 60 mg by mouth See admin instructions. 60 mg every morning, may take 60 mg in the afternoon as needed for swelling     traZODone (DESYREL) 50 MG tablet Take 1 tablet (50 mg total) by mouth at bedtime. 30 tablet 0   amLODipine (NORVASC) 5 MG tablet Take 1 tablet (5 mg total) by mouth daily. (Patient not taking: Reported on 01/06/2021) 30 tablet 0   Facility-Administered Medications Ordered in Other Encounters  Medication Dose Route Frequency Provider Last Rate Last Admin   metoprolol tartrate (LOPRESSOR) injection    Anesthesia Intra-op Mariea Clonts, CRNA   5 mg at 02/27/19 1318    Allergies as of 01/06/2021 - Review Complete 01/06/2021  Allergen Reaction Noted   Penicillin g sodium  10/30/2020   Penicillins Other (See Comments) 03/30/2016   Penicillins  07/08/2020    Family History  Problem Relation Age of Onset   Heart disease Mother 93   Hypertension Mother    Arthritis Mother    Heart failure Father 5   Stroke Maternal Aunt    Heart failure Sister    Colon cancer Neg Hx    Esophageal cancer Neg Hx    Stomach cancer Neg Hx    Rectal cancer Neg Hx     Social History  Socioeconomic History   Marital status: Widowed    Spouse name: Not on file   Number of children: 2   Years of education: college   Highest education level: Not on file  Occupational History   Occupation: retired  Tobacco Use   Smoking status: Former    Packs/day: 1.00    Years: 20.00    Pack years: 20.00    Types: Cigarettes   Smokeless tobacco: Never   Tobacco comments:       quit 20 years  smoked on and off for 20 years  Vaping Use   Vaping Use: Never used  Substance and Sexual Activity   Alcohol use: Not Currently    Comment: h/o heavy use   Drug use: Never   Sexual activity: Not Currently  Other Topics Concern   Not on file  Social History Narrative   ** Merged History Encounter **       The patient is divorced w/ 1 son 1 daughter He is a retired Regulatory affairs officer, he  lived in [place] but [employer] including windows on the world in the [place] room in [place] He moved to [place] to be near his sister who liv   es in [place] alcoholic member of AA 2 caffeinated beverages daily prior smoker no drugs   Social Determinants of Radio broadcast assistant Strain: Low Risk    Difficulty of Paying Living Expenses: Not hard at [employer] Insecurity: No Food Insecurity   Worried About Charity fundraiser in the Last Year: Never true   Arboriculturist in the Last Year: Never true  Transportation Needs: No Transportation Needs   Lack of Transportation (Medical): No   Lack of Transportation (Non-Medical): No  Physical Activity: Not on file  Stress: No Stress Concern Present   Feeling of Stress : Not at all  Social Connections: Moderately Integrated   Frequency of Communication with Friends and Family: More than three times a week   Frequency of Social Gatherings with Friends and Family: More than three times a week   Attends Religious Services: 1 to 4 times per year   Active Member of [employer] or Organizations: No   Attends Archivist Meetings: 1 to 4 times per year   Marital Status: Widowed  Human resources officer Violence: Not At Risk   Fear of Current or Ex-Partner: No   Emotionally Abused: No   Physically Abused: No   Sexually Abused: No    Review of Systems: ROS is O/W negative except as mentioned in HPI.  Physical Exam: Vital signs in last 24 hours: Temp:  [97.7 F (36.5 C)-99.4 F (37.4 C)] 99.4 F (37.4 C) (09/01 0718) Pulse Rate:  [62-125] 72 (09/01 0718) Resp:  [11-29] 19 (09/01 0718) BP: (138-179)/(61-116) 166/70 (09/01 0718) SpO2:  [81 %-100 %] 100 % (09/01 0718) Weight:  [008 kg] 108 kg (08/31 1837)   General:  Alert, Well-developed, well-nourished, pleasant and cooperative in NAD Head:  Normocephalic and atraumatic. Eyes:  Sclera clear, no icterus.  Conjunctiva pink. Ears:  Normal  auditory acuity. Mouth:  No deformity or lesions.   Lungs:  Clear throughout to auscultation.  No wheezes, crackles, or rhonchi.  Heart:  Regular rate and rhythm; no murmurs, clicks, rubs, or gallops. Abdomen:  Soft, non-distended.  BS present.  Non-tender. Msk:  Symmetrical without gross deformities. Pulses:  Normal pulses noted. Extremities:  +2 bilateral lower extremity edema with stasis dermatitis changes Neurologic:  Alert and oriented x 4;  grossly normal neurologically. Skin:  Intact without significant lesions or rashes. Psych:  Alert and cooperative. Normal mood and affect.  Intake/Output from previous day: 08/31 0701 - 09/01 0700 In: -  Out: 1000 [Urine:1000] Intake/Output this shift: Total I/O In: -  Out: 1200 [Urine:1200]  Lab Results: Recent Labs    01/06/21 1846 01/07/21 0557  WBC 5.5 5.4  HGB 6.0* 9.1*  HCT 18.2* 26.8*  PLT 185 158   BMET Recent Labs    01/06/21 1846 01/07/21 0557  NA 134* 138  K 3.6 3.2*  CL 101 103  CO2 23 26  GLUCOSE 118* 104*  BUN 26* 24*  CREATININE 3.08* 2.84*  CALCIUM 8.4* 8.7*   LFT Recent Labs    01/06/21 1846  PROT 5.5*  ALBUMIN 3.0*  AST 25  ALT 13  ALKPHOS 64  BILITOT 0.2*   PT/INR Recent Labs    01/06/21 1846  LABPROT 14.4  INR 1.1   IMPRESSION:  *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  He has experienced no overt sign of GI bleeding.  Hgb was 6.0 grams and now up to 9.1 grams after 2 units of PRBCs.  He is heme positive.  He presented with a 6.0 gram Hgb last month as well but was heme negative at that time so was just transfused. *Atrial fibrillation on Eliquis:  This is currently on hold with last dose evening of 8/30.   PLAN: -EGD and colonoscopy 9/2. -Pantoprazole 40 mg IV BID for now as has been ordered. -Monitor Hgb and transfuse further prn.   Laban Emperor. Zehr  01/07/2021, 8:58  AM  ________________________________________________________________________  Velora Heckler GI MD note:  I personally examined the patient, reviewed the data and agree with the assessment and plan described above. Very pleasant 80 yo man on eliquis with heme + acute on chronic anemia.  Known to have cecal AVMs and EGD 2 years ago found esophagitis and gastritis.  He feels quite a bit better after 2 unit blood tranfusion and he understands I am planning Colonsocopy and EGD tomorrow for further evaluation/treatment of his heme + acute on chronic anemia.   Owens Loffler, MD [facility] Pager [phone]

## 2021-01-07 NOTE — ED Notes (Signed)
Pt was receiving blood when lab came to collect blood work. 

## 2021-01-07 NOTE — Consult Note (Addendum)
[facility] Nurse Consult Note: Reason for Consult: Consult requested for left great toe.  Pt has been followed as an outpatient by Dr Earleen Newport of the podiatry service, most recently on 8/25.  He has ordered Betadine to the location and a dry dressing. 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 Wound is yellow and dry; no odor, drainage, or fluctuance Dressing procedure/placement/frequency: Continue present plan of care as previously ordered by podiatry. Topical treatment orders provided for bedside nurses to perform: Apply Betadine to left great toe wound Q day, then cover with foam dressing. (Change foam dressing Q 3 days or PRN soiling.) Pt plans to follow-up with podiatry after discharge. Please re-consult if further assistance is needed.  Thank-you,  Julien Girt MSN, [place], [place], [place], [place]

## 2021-01-07 NOTE — Consult Note (Addendum)
Cardiology Consultation:   Patient ID: Darryl Diaz MRN: [id number]; DOB: 1941/04/12  Admit date: 01/06/2021 Date of Consult: 01/07/2021  PCP:  [name], MD   [facility] Providers Cardiologist:  Sherren Mocha, MD  Electrophysiologist:  Virl Axe, MD    Patient Profile:   Darryl Diaz is a 80 y.o. 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 Candiss Norse.  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 Caryl Comes) - ?CCB toxicity related to excessive grapefruit juice; no pacer needed Heart failure with preserved ejection fraction    not a candidate for sotalol due to bradycardia, dofetilide due to baseline QT and electrolyte disturbances.  Amiodarone is not a great choice given his liver issues, but it was ultimately decided to continue this  History of Present Illness:   Darryl Diaz last saw EP service by myself March this year, doing ok, chronic edema managed with as needed lasix, he was edematous, legs erythematous, planned for lasix, abx and had f/u with cards the following wee for further. He had no symptoms of his AF, maintained on his Eliquis  He saw Dr. 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  Follows up with Dr. Barbaraann Boys.  Patient does not appear to be a good candidate for HD and also does not want dialysis. Foot wounds following with podiatry, chronic stasis dermatitis His eliquis dose was reduced 2/2 renal panel by note.  He was admitted yesterday at the recommendation of his nephrologist Dr. Joelyn Oms 2/2 abnormal labs/acute on chronic anemia with recurrent Hgb down to 6.0 without reports of any obvious bleeding Planned for PRBC and GI evaluation, Eliquis held GI saw him this Am with plans for EGD/colonoscopy tomorrow, started on pantoprazole H/H back to baseline s/p 2U  EP is asked to the case for recommendations on his [place] going forward  LABS K+ 3.6 > 3.2 (replacement already ordered) BUN/Creat 26/3.08 > 24/2.84 (looks his baseline) WBC 5.4 Hgb 6.0 >> 9.1 Hct 18.2 >> 26.8 Plts 158  LFTs OK Last TSH in [name] 2022 was wnl  Cardiac-wise, he feels like he has been well, no cardiac awareness, no CP, palpitations, denies any SOB. He denies any syncope or orthostatic symptoms.  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[place])    Cardiac arrest ([place]) 04/20/2019   12 min CPR with epi  Chronic diastolic CHF (congestive heart failure) (HCC)    Fatty liver    Gastritis    GERD (gastroesophageal reflux disease)    H/O seasonal allergies    History of GI bleed    Hx of adenomatous colonic polyps 08/10/2017   Hyperlipidemia    Hypertension    Insomnia    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   Mallory-Weiss tear    OA (osteoarthritis)    hands   OSA (obstructive sleep apnea)    No  longer uses CPAP   Persistent atrial fibrillation with rapid ventricular response ([place]) 02/05/2020   Recurrent syncope    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[facility])     Past Surgical History:  Procedure Laterality Date   ANKLE SURGERY Left 2014   had rods put in    BIOPSY  02/28/2019   Procedure: BIOPSY;  Surgeon: Milus Banister, MD;  Location: [place];  Service: Endoscopy;;   CARDIOVERSION  02/06/2020   CARDIOVERSION N/A 02/06/2020   Procedure: CARDIOVERSION;  Surgeon: Werner Lean, MD;  Location: [place] ENDOSCOPY;  Service: Cardiovascular;  Laterality: N/A;   CARDIOVERSION N/A 02/24/2020   Procedure: CARDIOVERSION;  Surgeon: Werner Lean, MD;  Location: [place];  Service: Cardiovascular;  Laterality: N/A;   COLONOSCOPY     [place]   ESOPHAGOGASTRODUODENOSCOPY (EGD) WITH PROPOFOL N/A 02/28/2019   Procedure: ESOPHAGOGASTRODUODENOSCOPY (EGD) WITH PROPOFOL;  Surgeon: Milus Banister, MD;  Location: [facility] ENDOSCOPY;  Service: Endoscopy;  Laterality: N/A;   HIP ARTHROPLASTY Left 04/01/2016   Procedure: ARTHROPLASTY BIPOLAR HIP (HEMIARTHROPLASTY);  Surgeon: Paralee Cancel, MD;  Location: [place];  Service: Orthopedics;  Laterality: Left;   HIP CLOSED REDUCTION Left 04/30/2016   Procedure: CLOSED REDUCTION HIP;  Surgeon: Wylene Simmer, MD;  Location: [place];  Service: Orthopedics;  Laterality: Left;   IR RADIOLOGIST EVAL & MGMT  12/31/2020   TONSILLECTOMY     TOTAL HIP ARTHROPLASTY Right 10/15/2019   Procedure: TOTAL HIP ARTHROPLASTY ANTERIOR APPROACH;  Surgeon: Paralee Cancel, MD;  Location: [facility];  Service: Orthopedics;  Laterality: Right;  70 mins     Home Medications:  Prior to Admission medications   Medication Sig Start Date End Date Taking? Authorizing Provider  acetaminophen (TYLENOL) 325 MG tablet Take 2 tablets (650 mg total) by mouth every 4 (four) hours as needed for headache or mild pain. 03/10/20  Yes Angiulli, Lavon Paganini, PA-C  amiodarone (PACERONE) 200 MG tablet Take 1  tablet (200 mg total) by mouth daily. Patient taking differently: Take 200 mg by mouth daily with lunch. 04/10/20  Yes Dunn, Dayna N, PA-C  apixaban (ELIQUIS) 2.5 MG TABS tablet Take 1 tablet (2.5 mg total) by mouth 2 (two) times daily. 12/03/20  Yes Aline August, MD  atorvastatin (LIPITOR) 20 MG tablet Take 1 tablet (20 mg total) by mouth daily. Patient taking differently: Take 20 mg by mouth daily with lunch. 03/10/20  Yes Angiulli, Lavon Paganini, PA-C  escitalopram (LEXAPRO) 10 MG tablet Take 1 tablet (10 mg total) by mouth daily. Patient taking differently: Take 10 mg by mouth daily with lunch. 03/10/20  Yes Angiulli, Lavon Paganini, PA-C  ferrous sulfate 325 (65 FE) MG tablet Take 1 tablet (325 mg total) by mouth daily. Patient taking differently: Take 325 mg by mouth daily with lunch. 12/03/20 01/06/21 Yes Aline August, MD  folic acid (FOLVITE) 1 MG tablet Take 1 tablet (1 mg total) by mouth daily. Patient taking differently: Take 1 mg by mouth daily with lunch. 03/10/20  Yes Angiulli, Lavon Paganini, PA-C  ketoconazole (NIZORAL) 2 % cream Apply 1 application topically daily. To dry scaly skin on feet Patient taking differently: Apply 1 application topically 2 (two) times daily as needed (dry scaly skin on feet). 07/23/20  Yes McDonald, Stephan Minister, DPM  loratadine (CLARITIN) 10 MG tablet Take 1 tablet (10 mg total) by mouth daily. Patient taking differently: Take 10 mg by mouth daily as needed for allergies. 02/12/20  Yes Medina-Vargas, Monina C, NP  Magnesium Oxide 400 MG CAPS Take 1 capsule (400 mg total) by mouth daily. Patient taking differently: Take 400 mg by mouth daily with lunch. 10/17/20  Yes Eugenie Filler, MD  methocarbamol (ROBAXIN) 500 MG tablet Take 1 tablet (500 mg total) by mouth every 6 (six) hours as needed for muscle spasms. 12/03/20  Yes Aline August, MD  Multiple Vitamins-Minerals (CENTRUM SILVER 50+MEN) TABS Take 1 tablet by mouth daily with breakfast.   Yes [provider]   nitroGLYCERIN (NITROSTAT) 0.4 MG SL tablet Place 1 tablet (0.4 mg total) under the tongue every 5 (five) minutes x 3 doses as needed for chest pain. 07/28/20  Yes Baldwin Jamaica, PA-C  pantoprazole (PROTONIX) 40 MG tablet Take 1 tablet (40 mg total) by mouth daily. Patient taking differently: Take 40 mg by mouth daily with lunch. 03/10/20  Yes Angiulli, Lavon Paganini, PA-C  potassium chloride SA (KLOR-CON) 20 MEQ tablet Take 1 tablet (20 mEq total) by mouth daily. Patient taking differently: Take 20 mEq by mouth daily with lunch. 10/17/20 10/12/21 Yes Eugenie Filler, MD  torsemide (DEMADEX) 20 MG tablet Take 60 mg by mouth See admin instructions. 60 mg every morning, may take 60 mg in the afternoon as needed for swelling   Yes [provider]  traZODone (DESYREL) 50 MG tablet Take 1 tablet (50 mg total) by mouth at bedtime. 03/10/20  Yes Angiulli, Lavon Paganini, PA-C  amLODipine (NORVASC) 5 MG tablet Take 1 tablet (5 mg total) by mouth daily. Patient not taking: Reported on 01/06/2021 09/14/20   Caren Griffins, MD    Inpatient Medications: Scheduled Meds:  amiodarone  200 mg Oral Q lunch   atorvastatin  20 mg Oral Q lunch   carvedilol  6.25 mg Oral BID WC   escitalopram  10 mg Oral Q lunch   pantoprazole (PROTONIX) IV  40 mg Intravenous Q12H   peg 3350 powder  0.5 kit Oral Once   And   [START ON 01/08/2021] peg 3350 powder  0.5 kit Oral Once   [START ON 01/08/2021] potassium chloride SA  20 mEq Oral Q lunch   sodium chloride flush  3 mL Intravenous Q12H   [START ON 01/08/2021] torsemide  60 mg Oral q morning   Continuous Infusions:  sodium chloride Stopped (01/07/21 0057)   potassium chloride 10 mEq (01/07/21 1150)   PRN Meds: acetaminophen **OR** [DISCONTINUED] acetaminophen, hydrALAZINE, [DISCONTINUED] ondansetron **OR** ondansetron (ZOFRAN) IV, senna-docusate  Allergies:    Allergies  Allergen Reactions   Penicillin G Sodium     Other reaction(s): rash   Penicillins Other (See  Comments)    Tolerated Cephalosporin Date: 10/15/19. Allergy is from childhood Did it involve swelling of the face/tongue/throat, SOB, or low BP? Unknown Did it involve sudden or severe rash/hives, skin peeling, or any reaction on the inside of your mouth or nose? Unknown Did you need to seek medical attention at a hospital or doctor's office? Unknown When did it last happen?    childhood reaction  If all above answers are "NO", may proceed with cephalosporin use.   Penicillins     Social History:   Social History   Socioeconomic History   Marital status: Widowed    Spouse name: Not on file   Number of children: 2   Years of education: college   Highest education level: Not on file  Occupational History   Occupation: retired  Tobacco Use   Smoking status: Former    Packs/day: 1.00    Years: 20.00    Pack years: 20.00    Types: Cigarettes   Smokeless tobacco: Never   Tobacco comments:       quit 20 years  smoked on and off for 20 years  Vaping Use   Vaping Use: Never used  Substance and Sexual Activity   Alcohol use: Not Currently    Comment: h/o heavy use   Drug use: Never   Sexual activity: Not Currently  Other Topics Concern   Not on file  Social History Narrative   ** Merged History Encounter **       The patient is divorced w/ 1 son 1 daughter He is a retired Regulatory affairs officer, he lived in [place] but [employer] including windows on the world in the [place] room in [place] He moved to [place] to be near his sister who liv   es in [place] alcoholic member of AA 2 caffeinated beverages daily prior smoker no drugs   Social Determinants of Radio broadcast assistant Strain: Low Risk    Difficulty of Paying Living Expenses: Not hard at [employer] Insecurity: No Food Insecurity   Worried About Charity fundraiser in the Last Year: Never true   Arboriculturist in the Last Year: Never true  Transportation Needs: No  Transportation Needs   Lack of Transportation (Medical): No   Lack of Transportation (Non-Medical): No  Physical Activity: Not on file  Stress: No Stress Concern Present   Feeling of Stress : Not at all  Social Connections: Moderately Integrated   Frequency of Communication with Friends and Family: More than three times a week   Frequency of Social Gatherings with Friends and Family: More than three times a week   Attends Religious Services: 1 to 4 times per year   Active Member of [employer] or Organizations: No   Attends Archivist Meetings: 1 to 4 times per year   Marital Status: Widowed  Human resources officer Violence: Not At Risk   Fear of Current or Ex-Partner: No   Emotionally Abused: No   Physically Abused: No   Sexually Abused: No    Family History:   Family History  Problem Relation Age of Onset   Heart disease Mother 14   Hypertension Mother    Arthritis Mother    Heart failure Father 45   Stroke Maternal Aunt    Heart failure Sister    Colon cancer Neg Hx    Esophageal cancer Neg Hx    Stomach cancer Neg Hx    Rectal cancer Neg Hx      ROS:  Please see the history of present illness.  All other ROS reviewed and negative.     Physical Exam/Data:   Vitals:   01/07/21 1025 01/07/21 1100 01/07/21 1142 01/07/21 1147  BP: (!) 152/77 (!) 179/80  (!) 174/83  Pulse: 64   70  Resp: (!) 21 18    Temp:   99.7 F (37.6  C)   TempSrc:   Oral   SpO2: 100% 100%    Weight:      Height:        Intake/Output Summary (Last 24 hours) at 01/07/2021 1154 Last data filed at 01/07/2021 0726 Gross per 24 hour  Intake --  Output 2200 ml  Net -2200 ml   Last 3 Weights 01/06/2021 12/03/2020 10/07/2020  Weight (lbs) 238 lb 1.6 oz 240 lb 250 lb  Weight (kg) 108 kg 108.863 kg 113.399 kg  Some encounter information is confidential and restricted. Go to Review Flowsheets activity to see all data.     Body mass index is 32.29 kg/m.  General:  Well nourished, well developed, in no  acute distress HEENT: normal Lymph: no adenopathy Neck: no JVD Endocrine:  No thryomegaly Vascular: No carotid bruits; Cardiac: RRR; no murmurs, gallops or rubs Lungs:  CTA b/l, no wheezing, rhonchi or rales  Abd: soft, nontender, no hepatomegaly  Ext: no edema Musculoskeletal:  No deformities Skin: warm and dry  Neuro:  no focal abnormalities noted Psych:  Normal affect   EKG:  The EKG was personally reviewed and demonstrates:   None this admission  Telemetry:  Telemetry was personally reviewed and demonstrates:   SR 60s  Relevant CV Studies:  Event monitor 04/2020 The basic rhythm is normal sinus with an average HR of 74 bpm No atrial fibrillation or flutter No high-grade heart block or pathologic pauses There are occasional PVC's and rare supraventricular beats without sustained arrhythmias   Myoview 02/23/20  Normal EF, normal perfusion; low risk    Limited Echocardiogram 02/23/20 EF 60-65, no RWMA, apical RV HK, normal RVSF, mild LAE, trivial MR, dilated aortic root (42 mm), dilated ascending aorta (39 mm)   Echocardiogram 02/06/20 EF 60-65, no RWMA, mild hyperkinesis of RV apex, low normal RVSF, mild LAE, mild eccentric MR, mild to mod AV sclerosis w/o AS, asc Aorta 40 mm, Ao root 39 , RVSP 36.5  Laboratory Data:  High Sensitivity Troponin:  No results for input(s): TROPONINIHS in the last 720 hours.   Chemistry Recent Labs  Lab 01/06/21 1846 01/07/21 0557  NA 134* 138  K 3.6 3.2*  CL 101 103  CO2 23 26  GLUCOSE 118* 104*  BUN 26* 24*  CREATININE 3.08* 2.84*  CALCIUM 8.4* 8.7*  GFRNONAA 20* 22*  ANIONGAP 10 9    Recent Labs  Lab 01/06/21 1846  PROT 5.5*  ALBUMIN 3.0*  AST 25  ALT 13  ALKPHOS 64  BILITOT 0.2*   Hematology Recent Labs  Lab 01/06/21 1846 01/07/21 0557  WBC 5.5 5.4  RBC 1.84* 2.86*  HGB 6.0* 9.1*  HCT 18.2* 26.8*  MCV 98.9 93.7  MCH 32.6 31.8  MCHC 33.0 34.0  RDW 13.5 13.6  PLT 185 158   BNPNo results for input(s):  BNP, PROBNP in the last 168 hours.  DDimer No results for input(s): DDIMER in the last 168 hours.   Radiology/Studies:  No results found.   Assessment and Plan:   Paroxysmal AFib CHA2DS2Vasc is 4, on Eliquis 2.66m out patient (held 2/2 bleed here) For his age/weight 546mdosing for his Eliquis is indicated  He has Iindication for full a/c for his AFib Will await GI findings for their recommendations regarding timing of restart  Once cleared to resume Eliquis by GI, his dose would be 23m10mID. Would like to consider him for Watchman given his risks of bleeding though would need to be able to tolerate full  dose [place] for a period of time.  Will follow GI recommendations  Dr. Burt Knack will see later today  2. Edema Some chronic component Got IV lasix today, planned for home torsemide tomorrow K+ replacement in progress    Risk Assessment/Risk Scores:    For questions or updates, please contact [place] Please consult [email or web address] for contact info under    Signed, Baldwin Jamaica, PA-C  01/07/2021 11:54 AM  Patient seen, examined. Available data reviewed. Agree with findings, assessment, and plan as outlined by Tommye Standard, PA-C.  On my exam, the patient is an elderly male in no distress, he is pale appearing.  HEENT is normal, JVP is normal, lungs are clear bilaterally, heart is regular rate and rhythm with no murmur or gallop, abdomen is soft and nontender, extremities have 2+ ankle edema bilaterally with chronic erythema unchanged from previous exams.  Neurologic is grossly intact.  The patient has now had 2 recent admissions with symptomatic anemia, hemoglobin 6 mg/dL on both admissions.  He has been on apixaban in the setting of atrial fibrillation with a CHADS2 Vasc score of 4.  Apixaban is currently on hold for endoscopic evaluation tomorrow. Reviewed treatment options with the patient. These include: 1) review GI evaluation and consider restarting anticoagulation pending  those results 2) Watchman LAA closure, short term anticoagulation if cleared by GI 3) stop anticoagulation, understanding his risk of recurrent problems with anemia will be high  Case discussed with the patient's son, Darryl Diaz as well as Dr Candiss Norse. I favor stopping anticoagulation in this very frail gentleman with multiple medical problems. He is not a candidate for Watchman due to his comorbid medical conditions and I think he will do poorly with continued anticoagulation.   Sherren Mocha, M.D. 01/07/2021 4:22 PM

## 2021-01-08 ENCOUNTER — Encounter (HOSPITAL_COMMUNITY)
Admission: EM | Disposition: A | Discharge status: Home / Self Care | Payer: Self-pay | Source: Home / Self Care | Attending: Internal Medicine

## 2021-01-08 ENCOUNTER — Encounter (HOSPITAL_COMMUNITY): Payer: Self-pay | Admitting: Internal Medicine

## 2021-01-08 ENCOUNTER — Inpatient Hospital Stay (HOSPITAL_COMMUNITY): Payer: Medicare Other | Admitting: Certified Registered"

## 2021-01-08 DIAGNOSIS — K5520 Angiodysplasia of colon without hemorrhage: Secondary | ICD-10-CM

## 2021-01-08 HISTORY — PX: COLONOSCOPY WITH PROPOFOL: SHX5780

## 2021-01-08 HISTORY — PX: HOT HEMOSTASIS: SHX5433

## 2021-01-08 LAB — TYPE AND SCREEN
ABO/RH(D): O POS
Antibody Screen: NEGATIVE
Unit division: 0
Unit division: 0

## 2021-01-08 LAB — CBC WITH DIFFERENTIAL/PLATELET
Abs Immature Granulocytes: 0.02 10*3/uL (ref 0.00–0.07)
Basophils Absolute: 0 10*3/uL (ref 0.0–0.1)
Basophils Relative: 0 %
Eosinophils Absolute: 0 10*3/uL (ref 0.0–0.5)
Eosinophils Relative: 0 %
HCT: 28.8 % — ABNORMAL LOW (ref 39.0–52.0)
Hemoglobin: 9.8 g/dL — ABNORMAL LOW (ref 13.0–17.0)
Immature Granulocytes: 0 %
Lymphocytes Relative: 13 %
Lymphs Abs: 0.7 10*3/uL (ref 0.7–4.0)
MCH: 31.7 pg (ref 26.0–34.0)
MCHC: 34 g/dL (ref 30.0–36.0)
MCV: 93.2 fL (ref 80.0–100.0)
Monocytes Absolute: 0.7 10*3/uL (ref 0.1–1.0)
Monocytes Relative: 12 %
Neutro Abs: 4.2 10*3/uL (ref 1.7–7.7)
Neutrophils Relative %: 75 %
Platelets: 188 10*3/uL (ref 150–400)
RBC: 3.09 MIL/uL — ABNORMAL LOW (ref 4.22–5.81)
RDW: 13.8 % (ref 11.5–15.5)
WBC: 5.6 10*3/uL (ref 4.0–10.5)
nRBC: 0 % (ref 0.0–0.2)

## 2021-01-08 LAB — BPAM RBC
Blood Product Expiration Date: 202209302359
Blood Product Expiration Date: 202209302359
ISSUE DATE / TIME: 202208312118
ISSUE DATE / TIME: 202209010046
Unit Type and Rh: 5100
Unit Type and Rh: 5100

## 2021-01-08 LAB — COMPREHENSIVE METABOLIC PANEL
ALT: 15 U/L (ref 0–44)
AST: 31 U/L (ref 15–41)
Albumin: 3 g/dL — ABNORMAL LOW (ref 3.5–5.0)
Alkaline Phosphatase: 70 U/L (ref 38–126)
Anion gap: 8 (ref 5–15)
BUN: 23 mg/dL (ref 8–23)
CO2: 28 mmol/L (ref 22–32)
Calcium: 8.6 mg/dL — ABNORMAL LOW (ref 8.9–10.3)
Chloride: 102 mmol/L (ref 98–111)
Creatinine, Ser: 2.63 mg/dL — ABNORMAL HIGH (ref 0.61–1.24)
GFR, Estimated: 24 mL/min — ABNORMAL LOW (ref >60)
Glucose, Bld: 108 mg/dL — ABNORMAL HIGH (ref 70–99)
Potassium: 3.1 mmol/L — ABNORMAL LOW (ref 3.5–5.1)
Sodium: 138 mmol/L (ref 135–145)
Total Bilirubin: 0.9 mg/dL (ref 0.3–1.2)
Total Protein: 5.9 g/dL — ABNORMAL LOW (ref 6.5–8.1)

## 2021-01-08 LAB — MAGNESIUM: Magnesium: 1.5 mg/dL — ABNORMAL LOW (ref 1.7–2.4)

## 2021-01-08 LAB — BRAIN NATRIURETIC PEPTIDE: B Natriuretic Peptide: 574.3 pg/mL — ABNORMAL HIGH (ref 0.0–100.0)

## 2021-01-08 SURGERY — COLONOSCOPY WITH PROPOFOL
Anesthesia: Monitor Anesthesia Care

## 2021-01-08 MED ORDER — PROPOFOL 10 MG/ML IV BOLUS
INTRAVENOUS | Status: DC | PRN
  Administered 2021-01-08: 40 mg via INTRAVENOUS

## 2021-01-08 MED ORDER — PHENYLEPHRINE HCL-NACL 20-0.9 MG/250ML-% IV SOLN
INTRAVENOUS | Status: DC | PRN
  Administered 2021-01-08: 50 ug/min via INTRAVENOUS

## 2021-01-08 MED ORDER — TORSEMIDE 20 MG PO TABS
60.0000 mg | ORAL_TABLET | Freq: Every morning | ORAL | Status: DC
  Administered 2021-01-08 – 2021-01-09 (×2): 60 mg via ORAL
  Filled 2021-01-08 (×2): qty 3

## 2021-01-08 MED ORDER — MAGNESIUM SULFATE IN D5W 1-5 GM/100ML-% IV SOLN
1.0000 g | Freq: Once | INTRAVENOUS | Status: AC
  Administered 2021-01-08: 1 g via INTRAVENOUS
  Filled 2021-01-08: qty 100

## 2021-01-08 MED ORDER — LIDOCAINE 2% (20 MG/ML) 5 ML SYRINGE
INTRAMUSCULAR | Status: DC | PRN
  Administered 2021-01-08: 100 mg via INTRAVENOUS

## 2021-01-08 MED ORDER — LACTATED RINGERS IV SOLN: INTRAVENOUS | Status: DC | PRN

## 2021-01-08 MED ORDER — PROPOFOL 500 MG/50ML IV EMUL
INTRAVENOUS | Status: DC | PRN
  Administered 2021-01-08: 100 ug/kg/min via INTRAVENOUS

## 2021-01-08 MED ORDER — POTASSIUM CHLORIDE 10 MEQ/100ML IV SOLN
10.0000 meq | INTRAVENOUS | Status: AC
  Administered 2021-01-08 (×4): 10 meq via INTRAVENOUS
  Filled 2021-01-08 (×4): qty 100

## 2021-01-08 MED ORDER — POTASSIUM CHLORIDE CRYS ER 20 MEQ PO TBCR: 20.0000 meq | EXTENDED_RELEASE_TABLET | Freq: Every day | ORAL | Status: DC

## 2021-01-08 MED ORDER — MAGNESIUM SULFATE 2 GM/50ML IV SOLN
2.0000 g | Freq: Once | INTRAVENOUS | Status: AC
  Administered 2021-01-08: 2 g via INTRAVENOUS
  Filled 2021-01-08: qty 50

## 2021-01-08 MED ORDER — POTASSIUM CHLORIDE CRYS ER 20 MEQ PO TBCR
40.0000 meq | EXTENDED_RELEASE_TABLET | Freq: Every day | ORAL | Status: DC
  Administered 2021-01-08 – 2021-01-09 (×2): 40 meq via ORAL
  Filled 2021-01-08 (×3): qty 2

## 2021-01-08 MED ORDER — TORSEMIDE 20 MG PO TABS: 60.0000 mg | ORAL_TABLET | Freq: Every morning | ORAL | Status: DC

## 2021-01-08 SURGICAL SUPPLY — 25 items

## 2021-01-08 NOTE — Interval H&P Note (Signed)
History and Physical Interval Note:  01/08/2021 9:16 AM  Darryl Diaz  has presented today for surgery, with the diagnosis of Anemia and heme positive stools.  The various methods of treatment have been discussed with the patient and family. After consideration of risks, benefits and other options for treatment, the patient has consented to  Procedure(s): ESOPHAGOGASTRODUODENOSCOPY (EGD) WITH PROPOFOL (N/A) COLONOSCOPY WITH PROPOFOL (N/A) as a surgical intervention.  The patient's history has been reviewed, patient examined, no change in status, stable for surgery.  I have reviewed the patient's chart and labs.  Questions were answered to the patient's satisfaction.     Milus Banister

## 2021-01-08 NOTE — Evaluation (Signed)
Occupational Therapy Evaluation Patient Details Name: Darryl Diaz MRN: [id number] DOB: 1940-06-10 Today's Date: 01/08/2021    History of Present Illness Pt is a 80 y.o. male admitted 8/31 with dx of acute GI bleed. He presented to ED with anemia, hgb 6.0. He received 2 units PRBCs in ED. PMH significant for paroxysmal atrial fibrillation on Eliquis, chronic diastolic CHF, CKD stage IV, tachycardia-bradycardia syndrome, PAD with nonhealing left foot wounds, HTN, HLD, history of GI bleed, and mood disorder.   Clinical Impression   Pt admitted for concerns listed above. PTA pt reported that he was independent with all ADL's, using a rollator. His ILF provides all IADL's the pt should need. At this time, pt is Mod I- Supervision for all ADL's and functional mobility. He appears safe with all OOB tasks, and motivated to continue moving and exercising once home. Pt has no further OT needs at this time and acute OT will sign off.     Follow Up Recommendations  No OT follow up    Equipment Recommendations  None recommended by OT    Recommendations for Other Services       Precautions / Restrictions Precautions Precautions: None Precaution Comments: no h/o recent falls Restrictions Weight Bearing Restrictions: No      Mobility Bed Mobility Overal bed mobility: Modified Independent             General bed mobility comments: +rail, HOB elevated    Transfers Overall transfer level: Needs assistance Equipment used: 4-wheeled walker Transfers: Sit to/from Stand Sit to Stand: Supervision         General transfer comment: supervision for safety    Balance Overall balance assessment: Needs assistance Sitting-balance support: No upper extremity supported;Feet supported Sitting balance-Leahy Scale: Good     Standing balance support: During functional activity;Bilateral upper extremity supported Standing balance-Leahy Scale: Poor Standing balance comment: reliant on BUE  support                           ADL either performed or assessed with clinical judgement   ADL Overall ADL's : Modified independent;At baseline                                       General ADL Comments: No concerns with safety or balance.     Vision Baseline Vision/History: 1 Wears glasses Ability to See in Adequate Light: 0 Adequate Patient Visual Report: No change from baseline Vision Assessment?: No apparent visual deficits     Perception Perception Perception Tested?: No   Praxis Praxis Praxis tested?: Not tested    Pertinent Vitals/Pain Pain Assessment: No/denies pain     Hand Dominance Right   Extremity/Trunk Assessment Upper Extremity Assessment Upper Extremity Assessment: Overall WFL for tasks assessed   Lower Extremity Assessment Lower Extremity Assessment: Defer to PT evaluation   Cervical / Trunk Assessment Cervical / Trunk Assessment: Kyphotic   Communication Communication Communication: No difficulties   Cognition Arousal/Alertness: Awake/alert Behavior During Therapy: WFL for tasks assessed/performed Overall Cognitive Status: Within Functional Limits for tasks assessed                                     General Comments  VSS on RA    Exercises     Shoulder Instructions  Home Living Family/patient expects to be discharged to:: Private residence Living Arrangements: Alone Available Help at Discharge: Family;Available PRN/intermittently Type of Home: Independent living facility Home Access: Level entry;Elevator     Home Layout: One level     Bathroom Shower/Tub: Occupational psychologist: Handicapped height Bathroom Accessibility: Yes   Home Equipment: Environmental consultant - 2 wheels;Walker - 4 wheels;Wheelchair - manual;Hand held shower head;Adaptive equipment;Shower seat;Shower seat - built in [employer] Equipment: Long-handled shoe horn;Reacher;Long-handled sponge Additional Comments: eats  meals in his apartment      Prior Functioning/Environment Level of Independence: Independent with assistive device(s)        Comments: rollator for amb        OT Problem List: Decreased strength;Decreased activity tolerance      OT Treatment/Interventions:      OT Goals(Current goals can be found in the care plan section) Acute Rehab OT Goals Patient Stated Goal: return to ILF OT Goal Formulation: All assessment and education complete, DC therapy Time For Goal Achievement: 01/08/21 Potential to Achieve Goals: Good  OT Frequency:     Barriers to D/C:            Co-evaluation              AM-PAC OT "6 Clicks" Daily Activity     Outcome Measure Help from another person eating meals?: None Help from another person taking care of personal grooming?: None Help from another person toileting, which includes using toliet, bedpan, or urinal?: None Help from another person bathing (including washing, rinsing, drying)?: None Help from another person to put on and taking off regular upper body clothing?: None Help from another person to put on and taking off regular lower body clothing?: None 6 Click Score: 24   End of Session Equipment Utilized During Treatment: Rolling walker Nurse Communication: Mobility status  Activity Tolerance: Patient tolerated treatment well Patient left: in chair;with call bell/phone within reach;with family/visitor present  OT Visit Diagnosis: Unsteadiness on feet (R26.81);Muscle weakness (generalized) (M62.81)                Time: 3151-7616 OT Time Calculation (min): 18 min Charges:  OT General Charges $OT Visit: 1 Visit OT Evaluation $OT Eval Low Complexity: [place]., OTR/L Acute Rehabilitation  Keala Drum Elane Yolanda Bonine 01/08/2021, 2:21 PM

## 2021-01-08 NOTE — Anesthesia Preprocedure Evaluation (Addendum)
Anesthesia Evaluation  Patient identified by MRN, date of birth, ID band Patient awake    Reviewed: Allergy & Precautions, NPO status , Patient's Chart, lab work & pertinent test results, reviewed documented beta blocker date and time   Airway Mallampati: III  TM Distance: >3 FB Neck ROM: Full    Dental  (+) Dental Advisory Given, Teeth Intact, Caps   Pulmonary sleep apnea , former smoker,  No longer uses CPAP   Pulmonary exam normal breath sounds clear to auscultation       Cardiovascular hypertension, Pt. on medications and Pt. on home beta blockers + CAD, + Past MI and +CHF  + dysrhythmias Atrial Fibrillation  Rhythm:Regular Rate:Tachycardia  Hx/o Cardiac arrest 04/20/2019 12 min CPR w/ epi  Echo 09/08/20 1. Left ventricular ejection fraction, by estimation, is 60 to 65%. The left ventricle has normal function. The left ventricle has no regional wall motion abnormalities. There is mild left ventricular hypertrophy. Left ventricular diastolic parameters are consistent with Grade I diastolic dysfunction (impaired relaxation).  2. Right ventricular systolic function is normal. The right ventricular size is normal.  3. Left atrial size was mildly dilated.  4. The mitral valve is normal in structure. No evidence of mitral valve regurgitation. No evidence of mitral stenosis.  5. The aortic valve is tricuspid. Aortic valve regurgitation is not visualized. Mild aortic valve sclerosis is present, with no evidence of aortic valve stenosis.  6. There is borderline dilatation of the aortic root, measuring 41 mm.  7. The inferior vena cava is normal in size with greater than 50% respiratory variability, suggesting right atrial pressure of 3 mmHg.   EKG 12/02/20 Sinus rhythm Short PR interval Probable left atrial enlargement Borderline IVCD with LAD Abnormal R-wave progression, late transition Inferior infarct, old Prolonged QT  interval   Neuro/Psych PSYCHIATRIC DISORDERS Depression  Neuromuscular disease    GI/Hepatic GERD  Medicated and Controlled,(+)     substance abuse  alcohol use, Hx/o alcoholism in remission Heme + stools   Endo/Other  Hyperlipidemia Obesity  Renal/GU Renal InsufficiencyRenal disease  negative genitourinary   Musculoskeletal  (+) Arthritis , Osteoarthritis,    Abdominal (+) + obese,  Abdomen: soft. Bowel sounds: normal.  Peds  Hematology  (+) anemia , Eliquis therapy- last dose 8/30   Anesthesia Other Findings   Reproductive/Obstetrics                            Anesthesia Physical  Anesthesia Plan  ASA: 3  Anesthesia Plan: MAC   Post-op Pain Management:    Induction: Intravenous  PONV Risk Score and Plan: 2 and Ondansetron and Treatment may vary due to age or medical condition  Airway Management Planned: Natural Airway and Simple Face Mask  Additional Equipment: None  Intra-op Plan:   Post-operative Plan:   Informed Consent: I have reviewed the patients History and Physical, chart, labs and discussed the procedure including the risks, benefits and alternatives for the proposed anesthesia with the patient or authorized representative who has indicated his/her understanding and acceptance.     Dental advisory given  Plan Discussed with: CRNA and Anesthesiologist  Anesthesia Plan Comments: (ECHO 12/20:  1. The left ventricle has no regional wall motion abnormalities.  2. LVEF is 60-65%.  3. Left atrial size was severely dilated.  4. The mitral valve was not assessed. not assessed mitral valve  regurgitation. No evidence of mitral stenosis.  5. The tricuspid valve is normal  in structure. Tricuspid valve  regurgitation is trivial.  6. The pulmonic valve was not well visualized. Pulmonic valve  regurgitation not assessed.  7. Limited echo s/p cardiac arrest  8. Not assessed.  9. The aortic valve was not assessed.  Aortic valve regurgitation not  assessed.  10. Left ventricular ejection fraction, by visual estimation, is 60 to  65%. The left ventricle has normal function. There is not assseded.  11. Global right ventricle has normal systolic function.The right  ventricular size is not assessed. Right vetricular wall thickness was not  assessed. )       Anesthesia Quick Evaluation

## 2021-01-08 NOTE — Progress Notes (Signed)
PROGRESS NOTE                                                                                                                                                                                                             Patient Demographics:    Darryl Diaz, is a 80 y.o. male, DOB - January 28, 1941, HFW:[id number]  Outpatient Primary MD for the patient is Tisovec, [name], MD    LOS - 1  Admit date - 01/06/2021    Chief Complaint  Patient presents with   Abnormal Lab       Brief Narrative (HPI from H&P) - Darryl Diaz is a 80 y.o. male with medical history significant for paroxysmal atrial fibrillation on Eliquis, chronic diastolic CHF, CKD stage IV, tachycardia-bradycardia syndrome, PAD with nonhealing left foot wounds, HTN, HLD, history of GI bleed, and mood disorder who presented to the ED for evaluation of anemia,    Subjective:   Patient in bed, appears comfortable, denies any headache, no fever, no chest pain or pressure, no shortness of breath , no abdominal pain. No new focal weakness.   Assessment  & Plan :     Acute on chronic anemia most likely due to lower GI bleed.  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  2.  Paroxysmal atrial fibrillation.  CHA2DS2-VASc 2 score of greater than 3.  On amiodarone along with Eliquis, Eliquis has now been permanently discontinued by cardiology to GI bleeds, I agree with plan.  3.  Chronic diastolic CHF EF 02% on echocardiogram done in May 2020 - he has chronic 3+ lower extremity edema, diabetes and monitor.  Cardiology also consulted.  Resume home dose diuretic.  4.  Chronic left Big Toe  plantar aspect outpatient follow-up with podiatry.  No acute issues.  5.  Hypertension.  Blood pressure elevated add Coreg and monitor.  6.  Hypokalemia and Hypomagnesemia.  Replaced.  7.  PAD.  Currently on statin, continue for secondary prevention outpatient follow-up vascular  8.  Dyslipidemia.  On statin.  9.  Depression stable with Lexapro.  10.  CKD stage IV Baseline creatinine close to 3.  Stable monitor.         Condition - Extremely Guarded  Family Communication  :  none present  Code Status :  DNR  Consults  :  GI, Cards  PUD Prophylaxis : PPI   Procedures  :     Colonoscopy -  01/08/21 - 3 Large bowel AVMs, one was bleeding, all cauterized.      Disposition Plan  :    Status is: Observation  Dispo: The patient is from: Home              Anticipated d/c is to: Home              Patient currently is not medically stable to d/c.   Difficult to place patient No   DVT Prophylaxis  :    SCDs Start: 01/06/21 2258  Lab Results  Component Value Date   PLT 188 01/08/2021    Diet :  Diet Order             DIET SOFT Room service appropriate? Yes; Fluid consistency: Nectar Thick  Diet effective now                    Inpatient Medications  Scheduled Meds:  amiodarone  200 mg Oral Q lunch   atorvastatin  20 mg Oral Q lunch   carvedilol  6.25 mg Oral BID WC   escitalopram  10 mg Oral Q lunch   pantoprazole (PROTONIX) IV  40 mg Intravenous Q12H   potassium chloride SA  20 mEq Oral Q lunch   sodium chloride flush  3 mL Intravenous Q12H   [START ON 01/09/2021] torsemide  60 mg Oral q morning   Continuous Infusions:  sodium chloride Stopped (01/07/21 0057)   magnesium sulfate bolus IVPB     potassium chloride 10 mEq (01/08/21 0845)   PRN Meds:.acetaminophen **OR** [DISCONTINUED] acetaminophen, hydrALAZINE, [DISCONTINUED] ondansetron **OR** ondansetron (ZOFRAN) IV, senna-docusate  Antibiotics  :    Anti-infectives (From admission, onward)     None        Time Spent in minutes  30   Lala Lund M.D on 01/08/2021 at 10:51 AM  To page go to [email or web address]   [facility] -  Office  [phone] See all Orders from today for further details    Objective:   Vitals:   01/08/21 1010 01/08/21 1011 01/08/21 1020 01/08/21 1030  BP: (!) 111/47 (!) 111/47 (!) 160/57 (!) 176/69  Pulse: (!) 48 (!) 49 (!) 54 (!) 53  Resp: 12 12 18 15   Temp:  (!) 97 F (36.1 C)    TempSrc:  Temporal    SpO2: 100% 100% 100% 99%  Weight:      Height:        Wt Readings from Last 3 Encounters:  01/06/21 108 kg  12/03/20 108.9 kg  10/07/20 113.4 kg     Intake/Output Summary (Last 24 hours) at 01/08/2021 1051 Last data filed at 01/08/2021 1004 Gross per 24 hour  Intake 1540 ml  Output 900 ml  Net 640 ml     Physical Exam  Awake Alert, No new F.N deficits, Normal affect [place].AT,PERRAL Supple Neck,No JVD, No cervical lymphadenopathy appriciated.  Symmetrical Chest wall movement, Good air movement bilaterally, CTAB RRR,No Gallops, Rubs or new Murmurs, No Parasternal Heave +ve B.Sounds, Abd Soft, No tenderness, No organomegaly appriciated, No rebound - guarding or rigidity. 3 leg edema left more  than right, on the L. Big toe plantar aspect small 1/2 cm shallow ulcer - no cellulitis    Data Review:    CBC Recent Labs  Lab 01/06/21 1846 01/07/21 0557 01/08/21 0020  WBC 5.5 5.4 5.6  HGB 6.0* 9.1* 9.8*  HCT 18.2* 26.8* 28.8*  PLT 185 158 188  MCV 98.9 93.7 93.2  MCH 32.6 31.8 31.7  MCHC 33.0 34.0 34.0  RDW 13.5 13.6 13.8  LYMPHSABS 1.0  --  0.7  MONOABS 0.6  --  0.7  EOSABS 0.0  --  0.0  BASOSABS 0.0  --  0.0    Recent Labs  Lab 01/06/21 1846 01/07/21 0557 01/08/21 0020  NA 134* 138 138  K 3.6 3.2* 3.1*  CL 101 103 102  CO2 23 26 28   GLUCOSE 118* 104* 108*  BUN 26* 24* 23  CREATININE 3.08* 2.84* 2.63*  CALCIUM 8.4* 8.7* 8.6*  AST 25  --  31  ALT 13  --  15  ALKPHOS 64  --  70  BILITOT 0.2*  --  0.9   ALBUMIN 3.0*  --  3.0*  MG  --   --  1.5*  INR 1.1  --   --   BNP  --   --  574.3*    ------------------------------------------------------------------------------------------------------------------ No results for input(s): CHOL, HDL, LDLCALC, TRIG, CHOLHDL, LDLDIRECT in the last 72 hours.  Lab Results  Component Value Date   HGBA1C 4.4 (L) 09/07/2020   ------------------------------------------------------------------------------------------------------------------ No results for input(s): TSH, T4TOTAL, T3FREE, THYROIDAB in the last 72 hours.  Invalid input(s): FREET3  Cardiac Enzymes No results for input(s): CKMB, TROPONINI, MYOGLOBIN in the last 168 hours.  Invalid input(s): CK ------------------------------------------------------------------------------------------------------------------    Component Value Date/Time   BNP 574.3 (H) 01/08/2021 0020    Radiology Reports IR Radiologist Eval & Mgmt  Result Date: 12/31/2020 Please refer to notes tab for details about interventional procedure. (Op Note)

## 2021-01-08 NOTE — Anesthesia Postprocedure Evaluation (Signed)
Anesthesia Post Note  Patient: Darryl Diaz  Procedure(s) Performed: COLONOSCOPY WITH PROPOFOL HOT HEMOSTASIS (ARGON PLASMA COAGULATION/BICAP)     Patient location during evaluation: PACU Anesthesia Type: MAC Level of consciousness: awake and alert and oriented Pain management: pain level controlled Vital Signs Assessment: post-procedure vital signs reviewed and stable Respiratory status: spontaneous breathing, nonlabored ventilation and respiratory function stable Cardiovascular status: stable and blood pressure returned to baseline Postop Assessment: no apparent nausea or vomiting Anesthetic complications: no   No notable events documented.  Last Vitals:  Vitals:   01/08/21 1020 01/08/21 1030  BP: (!) 160/57 (!) 176/69  Pulse: (!) 54 (!) 53  Resp: 18 15  Temp:    SpO2: 100% 99%    Last Pain:  Vitals:   01/08/21 1030  TempSrc:   PainSc: 0-No pain                 Joachim Carton A.

## 2021-01-08 NOTE — Evaluation (Signed)
Physical Therapy Evaluation Patient Details Name: Darryl Diaz MRN: [id number] DOB: 29-Aug-1940 Today's Date: 01/08/2021   History of Present Illness  Pt is a 80 y.o. male admitted 8/31 with dx of acute GI bleed. He presented to ED with anemia, hgb 6.0. He received 2 units PRBCs in ED. PMH significant for paroxysmal atrial fibrillation on Eliquis, chronic diastolic CHF, CKD stage IV, tachycardia-bradycardia syndrome, PAD with nonhealing left foot wounds, HTN, HLD, history of GI bleed, and mood disorder.   Clinical Impression  Pt admitted with above diagnosis. PTA pt resided at [facility], mod I mobility using rollator. On eval, he required supervision transfers, and supervision ambulation 180' with rollator.  Pt currently with functional limitations due to the deficits listed below (see PT Problem List). Pt will benefit from skilled PT to increase their independence and safety with mobility to allow discharge back to [facility]. PT to follow acutely. No follow up services indicated.       Follow Up Recommendations No PT follow up    Equipment Recommendations  None recommended by PT    Recommendations for Other Services       Precautions / Restrictions Precautions Precautions: None Precaution Comments: no h/o recent falls      Mobility  Bed Mobility Overal bed mobility: Modified Independent             General bed mobility comments: +rail, HOB elevated    Transfers Overall transfer level: Needs assistance Equipment used: 4-wheeled walker Transfers: Sit to/from Stand Sit to Stand: Supervision         General transfer comment: supervision for safety  Ambulation/Gait Ambulation/Gait assistance: Supervision Gait Distance (Feet): 180 Feet Assistive device: 4-wheeled walker Gait Pattern/deviations: Step-through pattern;Decreased stride length Gait velocity: WFL Gait velocity interpretation: >2.62 ft/sec, indicative of community ambulatory General Gait Details:  steady gait with rollator, supervision for safety  Stairs            Wheelchair Mobility    Modified Rankin (Stroke Patients Only)       Balance Overall balance assessment: Needs assistance Sitting-balance support: No upper extremity supported;Feet supported Sitting balance-Leahy Scale: Good     Standing balance support: During functional activity;Bilateral upper extremity supported Standing balance-Leahy Scale: Poor Standing balance comment: reliant on BUE support                             Pertinent Vitals/Pain Pain Assessment: No/denies pain    Home Living Family/patient expects to be discharged to:: Private residence Living Arrangements: Alone Available Help at Discharge: Family;Available PRN/intermittently Type of Home: Independent living facility Home Access: Level entry;Elevator     Home Layout: One level Home Equipment: Walker - 2 wheels;Walker - 4 wheels;Wheelchair - manual;Hand held shower head;Adaptive equipment;Shower seat;Shower seat - built in Additional Comments: eats meals in his apartment    Prior Function Level of Independence: Independent with assistive device(s)         Comments: rollator for amb     Hand Dominance   Dominant Hand: Right    Extremity/Trunk Assessment   Upper Extremity Assessment Upper Extremity Assessment: Overall WFL for tasks assessed    Lower Extremity Assessment Lower Extremity Assessment: Overall WFL for tasks assessed    Cervical / Trunk Assessment Cervical / Trunk Assessment: Kyphotic  Communication   Communication: No difficulties  Cognition Arousal/Alertness: Awake/alert Behavior During Therapy: WFL for tasks assessed/performed Overall Cognitive Status: Within Functional Limits for tasks assessed  General Comments      Exercises     Assessment/Plan    PT Assessment Patient needs continued PT services  PT Problem List  Decreased mobility;Decreased activity tolerance;Decreased balance       PT Treatment Interventions Therapeutic activities;Gait training;Therapeutic exercise;Patient/family education;Balance training;Functional mobility training    PT Goals (Current goals can be found in the Care Plan section)  Acute Rehab PT Goals Patient Stated Goal: return to ILF PT Goal Formulation: With patient Time For Goal Achievement: 01/22/21 Potential to Achieve Goals: Good    Frequency Min 3X/week   Barriers to discharge        Co-evaluation               AM-PAC PT "6 Clicks" Mobility  Outcome Measure Help needed turning from your back to your side while in a flat bed without using bedrails?: None Help needed moving from lying on your back to sitting on the side of a flat bed without using bedrails?: None Help needed moving to and from a bed to a chair (including a wheelchair)?: A Little Help needed standing up from a chair using your arms (e.g., wheelchair or bedside chair)?: A Little Help needed to walk in hospital room?: A Little Help needed climbing 3-5 steps with a railing? : A Lot 6 Click Score: 19    End of Session Equipment Utilized During Treatment: Gait belt Activity Tolerance: Patient tolerated treatment well Patient left: in bed;with call bell/phone within reach Nurse Communication: Mobility status PT Visit Diagnosis: Difficulty in walking, not elsewhere classified (R26.2)    Time: 0812-0823 PT Time Calculation (min) (ACUTE ONLY): 11 min   Charges:   PT Evaluation $PT Eval Moderate Complexity: 1 Mod          Lorrin Goodell, PT  Office # [phone] Pager [phone]   Lorriane Shire 01/08/2021, 8:34 AM

## 2021-01-08 NOTE — Op Note (Signed)
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 Estimated blood loss:                            None. Estimated Blood Loss:     Estimated blood loss: none. Procedure:                Pre-Anesthesia Assessment:                           - Prior to the procedure, a History and Physical                            was performed, and patient medications and                            allergies were reviewed. The patient's tolerance of                            previous anesthesia was also reviewed. The risks                            and benefits of the procedure and the sedation                            options and risks were discussed with the patient.                            All questions were answered, and informed consent                            was obtained. Prior Anticoagulants: The patient has  taken Eliquis (apixaban), last dose was 3 days                            prior to procedure. ASA Grade Assessment: IV - A                            patient with severe systemic disease that is a                            constant threat to life. After reviewing the risks                            and benefits, the patient was deemed in                            satisfactory condition to undergo the procedure.                            After obtaining informed consent, the colonoscope                            was passed under direct vision. Throughout the                            procedure, the patient's blood pressure, pulse, and                            oxygen saturations were monitored continuously. The                            CF-HQ190L ([id number]) Olympus colonoscope was                            introduced through the anus and advanced to the the                            cecum, identified by appendiceal orifice and                            ileocecal valve. The colonoscopy was performed                            without difficulty. The patient tolerated the                            procedure well. The quality of the bowel                            preparation was good. The ileocecal valve,                            appendiceal orifice, and rectum were photographed. 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 These were all       ablated with application of APC. One of them (cecum) bled transiently       during treatment.      Multiple small and large-mouthed diverticula were found in the entire       colon.      External and internal hemorrhoids were found. The hemorrhoids were small.      The exam was otherwise without abnormality on direct and retroflexion       views. 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 These were all ablated with                            application of APC. One of them (cecum) bled                            transiently during treatment.                            - Diverticulosis in the entire examined colon.                           - External and internal hemorrhoids.                           - No polyps or cancers. Recommendation:           - Given these findings and the fact that it has                            been decided that he not resume his blood thinners,                            I did not see need for EGD that was originally                            planned today. EGD 1 year ago was quite                            underwhelming.                           - Will allow him to eat solid food.                           -  OK for d/c from my perspective, follow up with GI                            PRN. Procedure Code(s):        --- Professional ---                           [phone], Colonoscopy, flexible; with control of                            bleeding, any method Diagnosis Code(s):        --- Professional ---                           K55.20, Angiodysplasia of colon without hemorrhage                           K64.8, Other hemorrhoids                           R19.5, Other fecal abnormalities                           K57.30, Diverticulosis of large intestine without                            perforation or abscess without bleeding CPT copyright 2019 American Medical Association. All rights reserved. The codes documented in this report are preliminary and upon coder review may  be revised to meet current compliance requirements. Milus Banister, MD 01/08/2021 10:12:55 AM This report has been signed electronically. Number of Addenda: 0

## 2021-01-08 NOTE — Transfer of Care (Signed)
Immediate Anesthesia Transfer of Care Note  Patient: Darryl Diaz  Procedure(s) Performed: COLONOSCOPY WITH PROPOFOL HOT HEMOSTASIS (ARGON PLASMA COAGULATION/BICAP)  Patient Location: PACU  Anesthesia Type:MAC  Level of Consciousness: drowsy and patient cooperative  Airway & Oxygen Therapy: Patient Spontanous Breathing and Patient connected to nasal cannula oxygen  Post-op Assessment: Report given to RN and Post -op Vital signs reviewed and stable  Post vital signs: Reviewed and stable  Last Vitals:  Vitals Value Taken Time  BP 160/57 01/08/21 1023  Temp 36.1 C 01/08/21 1011  Pulse 54 01/08/21 1023  Resp 15 01/08/21 1023  SpO2 99 % 01/08/21 1023  Vitals shown include unvalidated device data.  Last Pain:  Vitals:   01/08/21 1011  TempSrc: Temporal  PainSc: 0-No pain         Complications: No notable events documented.

## 2021-01-08 NOTE — Plan of Care (Signed)
  Problem: Education: Goal: Knowledge of General Education information will improve Description: Including pain rating scale, medication(s)/side effects and non-pharmacologic comfort measures Outcome: Progressing   Problem: Activity: Goal: Risk for activity intolerance will decrease Outcome: Progressing   Problem: Coping: Goal: Level of anxiety will decrease Outcome: Progressing   

## 2021-01-09 LAB — CBC WITH DIFFERENTIAL/PLATELET
Abs Immature Granulocytes: 0.03 10*3/uL (ref 0.00–0.07)
Basophils Absolute: 0 10*3/uL (ref 0.0–0.1)
Basophils Relative: 1 %
Eosinophils Absolute: 0 10*3/uL (ref 0.0–0.5)
Eosinophils Relative: 0 %
HCT: 25.9 % — ABNORMAL LOW (ref 39.0–52.0)
Hemoglobin: 8.6 g/dL — ABNORMAL LOW (ref 13.0–17.0)
Immature Granulocytes: 0 %
Lymphocytes Relative: 17 %
Lymphs Abs: 1.3 10*3/uL (ref 0.7–4.0)
MCH: 31.4 pg (ref 26.0–34.0)
MCHC: 33.2 g/dL (ref 30.0–36.0)
MCV: 94.5 fL (ref 80.0–100.0)
Monocytes Absolute: 0.9 10*3/uL (ref 0.1–1.0)
Monocytes Relative: 12 %
Neutro Abs: 5.4 10*3/uL (ref 1.7–7.7)
Neutrophils Relative %: 70 %
Platelets: 177 10*3/uL (ref 150–400)
RBC: 2.74 MIL/uL — ABNORMAL LOW (ref 4.22–5.81)
RDW: 13.6 % (ref 11.5–15.5)
WBC: 7.7 10*3/uL (ref 4.0–10.5)
nRBC: 0 % (ref 0.0–0.2)

## 2021-01-09 LAB — BRAIN NATRIURETIC PEPTIDE: B Natriuretic Peptide: 317.4 pg/mL — ABNORMAL HIGH (ref 0.0–100.0)

## 2021-01-09 LAB — CBC
HCT: 25.5 % — ABNORMAL LOW (ref 39.0–52.0)
Hemoglobin: 8.5 g/dL — ABNORMAL LOW (ref 13.0–17.0)
MCH: 31.5 pg (ref 26.0–34.0)
MCHC: 33.3 g/dL (ref 30.0–36.0)
MCV: 94.4 fL (ref 80.0–100.0)
Platelets: 183 10*3/uL (ref 150–400)
RBC: 2.7 MIL/uL — ABNORMAL LOW (ref 4.22–5.81)
RDW: 13.6 % (ref 11.5–15.5)
WBC: 6.2 10*3/uL (ref 4.0–10.5)
nRBC: 0 % (ref 0.0–0.2)

## 2021-01-09 LAB — MAGNESIUM: Magnesium: 2 mg/dL (ref 1.7–2.4)

## 2021-01-09 LAB — POTASSIUM: Potassium: 4.5 mmol/L (ref 3.5–5.1)

## 2021-01-09 MED ORDER — CARVEDILOL 6.25 MG PO TABS: 6.2500 mg | ORAL_TABLET | Freq: Two times a day (BID) | ORAL | 0 refills | Status: DC

## 2021-01-09 MED ORDER — HYDRALAZINE HCL 50 MG PO TABS: 50.0000 mg | ORAL_TABLET | Freq: Three times a day (TID) | ORAL | 0 refills | Status: DC

## 2021-01-09 MED ORDER — HYDRALAZINE HCL 50 MG PO TABS
50.0000 mg | ORAL_TABLET | Freq: Three times a day (TID) | ORAL | Status: DC
  Administered 2021-01-09: 50 mg via ORAL
  Filled 2021-01-09: qty 1

## 2021-01-09 NOTE — Discharge Instructions (Signed)
Follow with Primary MD Tisovec, [name], MD in 7 days   Get CBC, CMP, 2 view Chest X ray -  checked next visit within 1 week by ALF/SNF/Primary MD    Activity: As tolerated with Full fall precautions use walker/cane & assistance as needed  Disposition ALF/SNF  Diet: Heart Healthy with 1.5 lit/day fluid restriction  Special Instructions: If you have smoked or chewed Tobacco  in the last 2 yrs please stop smoking, stop any regular Alcohol  and or any Recreational drug use.  On your next visit with your primary care physician please Get Medicines reviewed and adjusted.  Please request your Prim.MD to go over all Hospital Tests and Procedure/Radiological results at the follow up, please get all Hospital records sent to your Prim MD by signing hospital release before you go home.  If you experience worsening of your admission symptoms, develop shortness of breath, life threatening emergency, suicidal or homicidal thoughts you must seek medical attention immediately by calling 911 or calling your MD immediately  if symptoms less severe.  You Must read complete instructions/literature along with all the possible adverse reactions/side effects for all the Medicines you take and that have been prescribed to you. Take any new Medicines after you have completely understood and accpet all the possible adverse reactions/side effects.

## 2021-01-09 NOTE — TOC Progression Note (Signed)
Transition of Care [facility]) - Progression Note    Patient Details  Name: Darryl Diaz MRN: [id number] Date of Birth: 15-Apr-1941  Transition of Care [facility]) CM/SW [place], [place] Phone Number: 01/09/2021, 2:08 PM  Clinical Narrative:    CSW was consulted by RN Rober Minion concerning transportation to pt's home.  CSW contacted safe transport to transport pt home.  CSW gave safe transport RN phone number to contact when they arrival at [facility].  RN Rober Minion was given information.  No further needs noted.  CSW signing off.        Expected Discharge Plan and Services           Expected Discharge Date: 01/09/21                                     Social Determinants of Health (SDOH) Interventions    Readmission Risk Interventions Readmission Risk Prevention Plan 03/18/2020 03/05/2020 04/23/2019  Transportation Screening Complete Complete Complete  PCP or Specialist Appt within 5-7 Days - - Complete  Home Care Screening - - Complete  Medication Review (RN CM) - - Complete  HRI or Home Care Consult Complete Complete -  Social Work Consult for [place] Planning/Counseling Complete Complete -  Palliative Care Screening Not Applicable Not Applicable -  Medication Review Press photographer) Complete Complete -  Some recent data might be hidden

## 2021-01-09 NOTE — Discharge Summary (Signed)
[name] WGN:[id number] DOB: [birth year] DOA: 01/06/2021  PCP: [name], MD  Admit date: 01/06/2021  Discharge date: 01/09/2021  Admitted From: ALF  Disposition:  ALF/SNF   Recommendations for Outpatient Follow-up:   Follow up with PCP in 1-2 weeks  PCP Please obtain BMP/CBC, 2 view CXR in 1week,  (see Discharge instructions)   PCP Please follow up on the following pending results: monitor CMP, BP and diuretic dose closely   Home Health: None   Equipment/Devices: None  Consultations: GI, Cards Discharge Condition: Stable    CODE STATUS: Full    Diet Recommendation: Heart Healthy with 1.5 L/day total fluid restriction.  Diet Order             Diet Heart Room service appropriate? Yes; Fluid consistency: Thin  Diet effective now                    Chief Complaint  Patient presents with   Abnormal Lab     Brief history of present illness from the day of admission and additional interim summary    Darryl Diaz is a 80 y.o. male with medical history significant for paroxysmal atrial fibrillation on Eliquis, chronic diastolic CHF, CKD stage IV, tachycardia-bradycardia syndrome, PAD with nonhealing left foot wounds, HTN, HLD, history of GI bleed, and mood disorder who presented to the ED for evaluation of anemia.                                                                 Hospital Course   Acute on chronic anemia most likely due to lower GI bleed.  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   2.  Paroxysmal atrial fibrillation.  CHA2DS2-VASc 2 score of greater than 3.  On amiodarone along with Eliquis, Eliquis has now been permanently discontinued by cardiology to GI bleeds, I agree with plan.   3.  Chronic diastolic CHF EF 78% on echocardiogram done in May 2020 - he has chronic lower extremity edema, continue home dose diuretic and placed on fluid restriction upon discharge. Close to baseline and not acutely decompensated.   4.  Chronic left Big Toe plantar aspect outpatient follow-up with podiatry.  No acute issues.   5.  Hypertension.  Blood pressure elevated add Coreg and monitor.   6.  Hypokalemia and Hypomagnesemia.  Replaced.   7.  PAD.  Currently on  statin, continue for secondary prevention outpatient follow-up vascular   8.  Dyslipidemia.  On statin.   9.  Depression stable with Lexapro.   10.  CKD stage IV Baseline creatinine close to 3.  Stable monitor.   Discharge diagnosis     Principal Problem:   Acute GI bleeding Active Problems:   Hypertension   Hyperlipidemia   Anemia   Paroxysmal atrial fibrillation (HCC)   Skin ulcer of left great toe (HCC)   CKD (chronic kidney disease) stage 4, GFR 15-29 ml/min (HCC)   Chronic heart failure with preserved ejection fraction (HFpEF) [name])    Discharge instructions    Discharge Instructions     Discharge instructions   Complete by: As directed    Follow with Primary MD Tisovec, [name], MD in 7 days   Get CBC, CMP, 2 view Chest X ray -  checked next visit within 1 week by ALF/SNF/Primary MD    Activity: As tolerated with Full fall precautions use walker/cane & assistance as needed  Disposition ALF/SNF  Diet: Heart Healthy with 1.5 lit/day fluid restriction  Special Instructions: If you have smoked or chewed Tobacco  in the last 2 yrs please stop smoking, stop any regular Alcohol  and or any Recreational drug use.  On  your next visit with your primary care physician please Get Medicines reviewed and adjusted.  Please request your Prim.MD to go over all Hospital Tests and Procedure/Radiological results at the follow up, please get all Hospital records sent to your Prim MD by signing hospital release before you go home.  If you experience worsening of your admission symptoms, develop shortness of breath, life threatening emergency, suicidal or homicidal thoughts you must seek medical attention immediately by calling 911 or calling your MD immediately  if symptoms less severe.  You Must read complete instructions/literature along with all the possible adverse reactions/side effects for all the Medicines you take and that have been prescribed to you. Take any new Medicines after you have completely understood and accpet all the possible adverse reactions/side effects.   Discharge wound care:   Complete by: As directed    Clean with soap and water, dry the area then apply Betadine to left great toe wound Q day and cover with foam dressing   Increase activity slowly   Complete by: As directed        Discharge Medications   Allergies as of 01/09/2021       Reactions   Penicillin G Sodium    Other reaction(s): rash   Penicillins Other (See Comments)   Tolerated Cephalosporin Date: 10/15/19. Allergy is from childhood Did it involve swelling of the face/tongue/throat, SOB, or low BP? Unknown Did it involve sudden or severe rash/hives, skin peeling, or any reaction on the inside of your mouth or nose? Unknown Did you need to seek medical attention at a hospital or doctor's office? Unknown When did it last happen?    childhood reaction   If all above answers are "NO", may proceed with cephalosporin use.   Penicillins         Medication List     STOP taking these medications    amLODipine 5 MG tablet Commonly known as: NORVASC   apixaban 2.5 MG Tabs tablet Commonly known as: ELIQUIS       TAKE  these medications    acetaminophen 325 MG tablet Commonly known as: TYLENOL Take 2 tablets (650 mg total) by mouth every 4 (four) hours as needed for  headache or mild pain.   amiodarone 200 MG tablet Commonly known as: PACERONE Take 1 tablet (200 mg total) by mouth daily. What changed: when to take this   atorvastatin 20 MG tablet Commonly known as: LIPITOR Take 1 tablet (20 mg total) by mouth daily. What changed: when to take this   carvedilol 6.25 MG tablet Commonly known as: COREG Take 1 tablet (6.25 mg total) by mouth 2 (two) times daily with a meal.   Centrum Silver 50+Men Tabs Take 1 tablet by mouth daily with breakfast.   escitalopram 10 MG tablet Commonly known as: LEXAPRO Take 1 tablet (10 mg total) by mouth daily. What changed: when to take this   ferrous sulfate 325 (65 FE) MG tablet Take 1 tablet (325 mg total) by mouth daily. What changed: when to take this   folic acid 1 MG tablet Commonly known as: FOLVITE Take 1 tablet (1 mg total) by mouth daily. What changed: when to take this   hydrALAZINE 50 MG tablet Commonly known as: APRESOLINE Take 1 tablet (50 mg total) by mouth 3 (three) times daily.   ketoconazole 2 % cream Commonly known as: NIZORAL Apply 1 application topically daily. To dry scaly skin on feet What changed:  when to take this reasons to take this additional instructions   loratadine 10 MG tablet Commonly known as: CLARITIN Take 1 tablet (10 mg total) by mouth daily. What changed:  when to take this reasons to take this   Magnesium Oxide 400 MG Caps Take 1 capsule (400 mg total) by mouth daily. What changed: when to take this   methocarbamol 500 MG tablet Commonly known as: ROBAXIN Take 1 tablet (500 mg total) by mouth every 6 (six) hours as needed for muscle spasms.   nitroGLYCERIN 0.4 MG SL tablet Commonly known as: NITROSTAT Place 1 tablet (0.4 mg total) under the tongue every 5 (five) minutes x 3 doses as needed for  chest pain.   pantoprazole 40 MG tablet Commonly known as: PROTONIX Take 1 tablet (40 mg total) by mouth daily. What changed: when to take this   potassium chloride SA 20 MEQ tablet Commonly known as: KLOR-CON Take 1 tablet (20 mEq total) by mouth daily. What changed: when to take this   torsemide 20 MG tablet Commonly known as: DEMADEX Take 60 mg by mouth See admin instructions. 60 mg every morning, may take 60 mg in the afternoon as needed for swelling   traZODone 50 MG tablet Commonly known as: DESYREL Take 1 tablet (50 mg total) by mouth at bedtime.               Discharge Care Instructions  (From admission, onward)           Start     Ordered   01/09/21 0000  Discharge wound care:       Comments: Clean with soap and water, dry the area then apply Betadine to left great toe wound Q day and cover with foam dressing   01/09/21 0921             Follow-up Information     Richardson Dopp T, PA-C Follow up on 02/03/2021.   Specialties: Cardiology, Physician Assistant Why: at 945am Contact information: [address] [place] [address] [phone]         Tisovec, [name], MD. Schedule an appointment as soon as possible for a visit in 1 week(s).   Specialty: Internal Medicine Contact information: [address] [place] [address] [phone]         Sherren Mocha, MD .   Specialty: Cardiology Contact information: [phone] [address] [place] [address] [phone]         Deboraha Sprang, MD .   Specialty: Cardiology Contact information: [phone] [address] [place] [address] [phone]                  Today   Subjective    Philippe Gang today has no headache,no chest abdominal pain,no new weakness tingling or numbness, feels much better     Objective   Blood pressure (!) 165/70, pulse (!) 56, temperature 98.3 F (36.8 C), temperature source Oral, resp.  rate 18, height 6' (1.829 m), weight 108 kg, SpO2 100 %.   Intake/Output Summary (Last 24 hours) at 01/09/2021 0921 Last data filed at 01/09/2021 0918 Gross per 24 hour  Intake 1156 ml  Output 520 ml  Net 636 ml    Exam  Awake Alert, No new F.N deficits, Normal affect [place].AT,PERRAL Supple Neck,No JVD, No cervical lymphadenopathy appriciated.  Symmetrical Chest wall movement, Good air movement bilaterally, CTAB RRR,No Gallops,Rubs or new Murmurs, No Parasternal Heave +ve B.Sounds, Abd Soft, Non tender, No organomegaly appriciated, No rebound -guarding or rigidity. No Cyanosis, 1+ leg edema - chronic  L big toe      Data Review   CBC w Diff:  Lab Results  Component Value Date   WBC 6.2 01/09/2021   HGB 8.5 (L) 01/09/2021   HGB 10.2 (L) 06/03/2020   HCT 25.5 (L) 01/09/2021   HCT 31.0 (L) 06/03/2020   PLT 183 01/09/2021   PLT 163 06/03/2020   LYMPHOPCT 17 01/09/2021   MONOPCT 12 01/09/2021   EOSPCT 0 01/09/2021   BASOPCT 1 01/09/2021    CMP:  Lab Results  Component Value Date   NA 138 01/08/2021   NA 133 (L) 09/03/2020   K 4.5 01/09/2021   CL 102 01/08/2021   CO2 28 01/08/2021   BUN 23 01/08/2021   BUN 27 09/03/2020   CREATININE 2.63 (H) 01/08/2021   PROT 5.9 (L) 01/08/2021   PROT 5.7 (L) 06/03/2020   ALBUMIN 3.0 (L) 01/08/2021   ALBUMIN 3.4 (L) 06/03/2020   BILITOT 0.9 01/08/2021   BILITOT 0.5 06/03/2020   ALKPHOS 70 01/08/2021   AST 31 01/08/2021   ALT 15 01/08/2021  .   Total Time in preparing paper work, data evaluation and todays exam - 40 minutes  Lala Lund M.D on 01/09/2021 at 9:21 AM  [facility]

## 2021-01-09 NOTE — Progress Notes (Signed)
Patient discharged home.  Patient provided with discharge instructions via handout.  Patient verbalized understanding and verified pharmacy where prescription medications were sent.  PIV and telemetry removed.  Patient tolerated well. Patient able to gather up all personal belongings prior to leaving including cell phone and rolling walker.  Patient able to leave the unit via wheelchair transport accompanied by nurse tech.

## 2021-01-11 ENCOUNTER — Encounter (HOSPITAL_COMMUNITY): Payer: Self-pay | Admitting: Gastroenterology

## 2021-01-15 ENCOUNTER — Other Ambulatory Visit (HOSPITAL_COMMUNITY): Payer: Self-pay | Admitting: Interventional Radiology

## 2021-01-15 DIAGNOSIS — L97529 Non-pressure chronic ulcer of other part of left foot with unspecified severity: Secondary | ICD-10-CM

## 2021-01-16 ENCOUNTER — Inpatient Hospital Stay (HOSPITAL_COMMUNITY)
Admission: EM | Admit: 2021-01-16 | Discharge: 2021-01-22 | DRG: 854 | Disposition: A | Discharge status: Skilled Nursing Facility | Payer: Medicare Other | Source: Skilled Nursing Facility | Attending: Internal Medicine | Admitting: Internal Medicine

## 2021-01-16 ENCOUNTER — Emergency Department (HOSPITAL_COMMUNITY): Payer: Medicare Other

## 2021-01-16 ENCOUNTER — Other Ambulatory Visit: Payer: Self-pay

## 2021-01-16 ENCOUNTER — Encounter (HOSPITAL_COMMUNITY): Payer: Self-pay | Admitting: Emergency Medicine

## 2021-01-16 DIAGNOSIS — R0602 Shortness of breath: Secondary | ICD-10-CM | POA: Diagnosis not present

## 2021-01-16 DIAGNOSIS — Z79899 Other long term (current) drug therapy: Secondary | ICD-10-CM

## 2021-01-16 DIAGNOSIS — N184 Chronic kidney disease, stage 4 (severe): Secondary | ICD-10-CM | POA: Diagnosis present

## 2021-01-16 DIAGNOSIS — E785 Hyperlipidemia, unspecified: Secondary | ICD-10-CM | POA: Diagnosis present

## 2021-01-16 DIAGNOSIS — F1021 Alcohol dependence, in remission: Secondary | ICD-10-CM | POA: Diagnosis present

## 2021-01-16 DIAGNOSIS — R52 Pain, unspecified: Secondary | ICD-10-CM

## 2021-01-16 DIAGNOSIS — J811 Chronic pulmonary edema: Secondary | ICD-10-CM | POA: Diagnosis not present

## 2021-01-16 DIAGNOSIS — B964 Proteus (mirabilis) (morganii) as the cause of diseases classified elsewhere: Secondary | ICD-10-CM | POA: Diagnosis present

## 2021-01-16 DIAGNOSIS — R809 Proteinuria, unspecified: Secondary | ICD-10-CM | POA: Diagnosis present

## 2021-01-16 DIAGNOSIS — K7030 Alcoholic cirrhosis of liver without ascites: Secondary | ICD-10-CM | POA: Diagnosis present

## 2021-01-16 DIAGNOSIS — N3000 Acute cystitis without hematuria: Secondary | ICD-10-CM | POA: Diagnosis not present

## 2021-01-16 DIAGNOSIS — Z8249 Family history of ischemic heart disease and other diseases of the circulatory system: Secondary | ICD-10-CM

## 2021-01-16 DIAGNOSIS — D649 Anemia, unspecified: Secondary | ICD-10-CM | POA: Diagnosis present

## 2021-01-16 DIAGNOSIS — F329 Major depressive disorder, single episode, unspecified: Secondary | ICD-10-CM | POA: Diagnosis present

## 2021-01-16 DIAGNOSIS — D500 Iron deficiency anemia secondary to blood loss (chronic): Secondary | ICD-10-CM | POA: Diagnosis present

## 2021-01-16 DIAGNOSIS — R823 Hemoglobinuria: Secondary | ICD-10-CM | POA: Diagnosis present

## 2021-01-16 DIAGNOSIS — B958 Unspecified staphylococcus as the cause of diseases classified elsewhere: Secondary | ICD-10-CM | POA: Diagnosis present

## 2021-01-16 DIAGNOSIS — M7989 Other specified soft tissue disorders: Secondary | ICD-10-CM | POA: Diagnosis not present

## 2021-01-16 DIAGNOSIS — Z96641 Presence of right artificial hip joint: Secondary | ICD-10-CM | POA: Diagnosis present

## 2021-01-16 DIAGNOSIS — N179 Acute kidney failure, unspecified: Secondary | ICD-10-CM | POA: Diagnosis present

## 2021-01-16 DIAGNOSIS — A419 Sepsis, unspecified organism: Secondary | ICD-10-CM | POA: Diagnosis not present

## 2021-01-16 DIAGNOSIS — S0990XA Unspecified injury of head, initial encounter: Secondary | ICD-10-CM | POA: Diagnosis not present

## 2021-01-16 DIAGNOSIS — L03115 Cellulitis of right lower limb: Secondary | ICD-10-CM | POA: Diagnosis not present

## 2021-01-16 DIAGNOSIS — Z8674 Personal history of sudden cardiac arrest: Secondary | ICD-10-CM

## 2021-01-16 DIAGNOSIS — G4733 Obstructive sleep apnea (adult) (pediatric): Secondary | ICD-10-CM | POA: Diagnosis present

## 2021-01-16 DIAGNOSIS — R296 Repeated falls: Secondary | ICD-10-CM | POA: Diagnosis not present

## 2021-01-16 DIAGNOSIS — I480 Paroxysmal atrial fibrillation: Secondary | ICD-10-CM | POA: Diagnosis present

## 2021-01-16 DIAGNOSIS — I2510 Atherosclerotic heart disease of native coronary artery without angina pectoris: Secondary | ICD-10-CM | POA: Diagnosis present

## 2021-01-16 DIAGNOSIS — G319 Degenerative disease of nervous system, unspecified: Secondary | ICD-10-CM | POA: Diagnosis not present

## 2021-01-16 DIAGNOSIS — K219 Gastro-esophageal reflux disease without esophagitis: Secondary | ICD-10-CM | POA: Diagnosis present

## 2021-01-16 DIAGNOSIS — E872 Acidosis: Secondary | ICD-10-CM | POA: Diagnosis present

## 2021-01-16 DIAGNOSIS — Z66 Do not resuscitate: Secondary | ICD-10-CM | POA: Diagnosis present

## 2021-01-16 DIAGNOSIS — N390 Urinary tract infection, site not specified: Secondary | ICD-10-CM | POA: Diagnosis present

## 2021-01-16 DIAGNOSIS — I130 Hypertensive heart and chronic kidney disease with heart failure and stage 1 through stage 4 chronic kidney disease, or unspecified chronic kidney disease: Secondary | ICD-10-CM | POA: Diagnosis present

## 2021-01-16 DIAGNOSIS — G4700 Insomnia, unspecified: Secondary | ICD-10-CM | POA: Diagnosis present

## 2021-01-16 DIAGNOSIS — I10 Essential (primary) hypertension: Secondary | ICD-10-CM | POA: Diagnosis present

## 2021-01-16 DIAGNOSIS — E871 Hypo-osmolality and hyponatremia: Secondary | ICD-10-CM | POA: Diagnosis present

## 2021-01-16 DIAGNOSIS — Z880 Allergy status to penicillin: Secondary | ICD-10-CM

## 2021-01-16 DIAGNOSIS — Z7901 Long term (current) use of anticoagulants: Secondary | ICD-10-CM

## 2021-01-16 DIAGNOSIS — W19XXXA Unspecified fall, initial encounter: Secondary | ICD-10-CM | POA: Diagnosis not present

## 2021-01-16 DIAGNOSIS — M47812 Spondylosis without myelopathy or radiculopathy, cervical region: Secondary | ICD-10-CM | POA: Diagnosis not present

## 2021-01-16 DIAGNOSIS — R822 Biliuria: Secondary | ICD-10-CM | POA: Diagnosis present

## 2021-01-16 DIAGNOSIS — M2578 Osteophyte, vertebrae: Secondary | ICD-10-CM | POA: Diagnosis not present

## 2021-01-16 DIAGNOSIS — S2231XA Fracture of one rib, right side, initial encounter for closed fracture: Secondary | ICD-10-CM | POA: Diagnosis not present

## 2021-01-16 DIAGNOSIS — I495 Sick sinus syndrome: Secondary | ICD-10-CM | POA: Diagnosis present

## 2021-01-16 DIAGNOSIS — S199XXA Unspecified injury of neck, initial encounter: Secondary | ICD-10-CM | POA: Diagnosis not present

## 2021-01-16 DIAGNOSIS — M5136 Other intervertebral disc degeneration, lumbar region: Secondary | ICD-10-CM | POA: Diagnosis not present

## 2021-01-16 DIAGNOSIS — I4891 Unspecified atrial fibrillation: Secondary | ICD-10-CM | POA: Diagnosis not present

## 2021-01-16 DIAGNOSIS — I5032 Chronic diastolic (congestive) heart failure: Secondary | ICD-10-CM | POA: Diagnosis present

## 2021-01-16 DIAGNOSIS — M4056 Lordosis, unspecified, lumbar region: Secondary | ICD-10-CM | POA: Diagnosis not present

## 2021-01-16 DIAGNOSIS — Z87891 Personal history of nicotine dependence: Secondary | ICD-10-CM

## 2021-01-16 DIAGNOSIS — Z20822 Contact with and (suspected) exposure to covid-19: Secondary | ICD-10-CM | POA: Diagnosis present

## 2021-01-16 DIAGNOSIS — I4819 Other persistent atrial fibrillation: Secondary | ICD-10-CM | POA: Diagnosis present

## 2021-01-16 DIAGNOSIS — Z86010 Personal history of colonic polyps: Secondary | ICD-10-CM

## 2021-01-16 DIAGNOSIS — K760 Fatty (change of) liver, not elsewhere classified: Secondary | ICD-10-CM | POA: Diagnosis present

## 2021-01-16 LAB — CBC WITH DIFFERENTIAL/PLATELET
Abs Immature Granulocytes: 0.13 10*3/uL — ABNORMAL HIGH (ref 0.00–0.07)
Basophils Absolute: 0 10*3/uL (ref 0.0–0.1)
Basophils Relative: 0 %
Eosinophils Absolute: 0 10*3/uL (ref 0.0–0.5)
Eosinophils Relative: 0 %
HCT: 25.4 % — ABNORMAL LOW (ref 39.0–52.0)
Hemoglobin: 8.6 g/dL — ABNORMAL LOW (ref 13.0–17.0)
Immature Granulocytes: 1 %
Lymphocytes Relative: 2 %
Lymphs Abs: 0.3 10*3/uL — ABNORMAL LOW (ref 0.7–4.0)
MCH: 32 pg (ref 26.0–34.0)
MCHC: 33.9 g/dL (ref 30.0–36.0)
MCV: 94.4 fL (ref 80.0–100.0)
Monocytes Absolute: 1 10*3/uL (ref 0.1–1.0)
Monocytes Relative: 6 %
Neutro Abs: 14.2 10*3/uL — ABNORMAL HIGH (ref 1.7–7.7)
Neutrophils Relative %: 91 %
Platelets: 238 10*3/uL (ref 150–400)
RBC: 2.69 MIL/uL — ABNORMAL LOW (ref 4.22–5.81)
RDW: 13.1 % (ref 11.5–15.5)
WBC: 15.7 10*3/uL — ABNORMAL HIGH (ref 4.0–10.5)
nRBC: 0 % (ref 0.0–0.2)

## 2021-01-16 LAB — BRAIN NATRIURETIC PEPTIDE: B Natriuretic Peptide: 351.8 pg/mL — ABNORMAL HIGH (ref 0.0–100.0)

## 2021-01-16 LAB — COMPREHENSIVE METABOLIC PANEL
ALT: 14 U/L (ref 0–44)
AST: 23 U/L (ref 15–41)
Albumin: 3 g/dL — ABNORMAL LOW (ref 3.5–5.0)
Alkaline Phosphatase: 72 U/L (ref 38–126)
Anion gap: 15 (ref 5–15)
BUN: 53 mg/dL — ABNORMAL HIGH (ref 8–23)
CO2: 20 mmol/L — ABNORMAL LOW (ref 22–32)
Calcium: 9 mg/dL (ref 8.9–10.3)
Chloride: 94 mmol/L — ABNORMAL LOW (ref 98–111)
Creatinine, Ser: 4.29 mg/dL — ABNORMAL HIGH (ref 0.61–1.24)
GFR, Estimated: 13 mL/min — ABNORMAL LOW (ref >60)
Glucose, Bld: 112 mg/dL — ABNORMAL HIGH (ref 70–99)
Potassium: 3.5 mmol/L (ref 3.5–5.1)
Sodium: 129 mmol/L — ABNORMAL LOW (ref 135–145)
Total Bilirubin: 1 mg/dL (ref 0.3–1.2)
Total Protein: 6.2 g/dL — ABNORMAL LOW (ref 6.5–8.1)

## 2021-01-16 LAB — PROTIME-INR
INR: 1.2 (ref 0.8–1.2)
Prothrombin Time: 14.9 seconds (ref 11.4–15.2)

## 2021-01-16 NOTE — ED Provider Notes (Signed)
Emergency Medicine Provider Triage Evaluation Note  Darryl Diaz , a 80 y.o. male  was evaluated in triage.  Pt complains of multiple falls for the past week.  Patient was brought in via [place], has felt unsteady on his feet during the last week.  He was recently taken off Eliquis due to an endoscopy performed last week.  Does not report any pain aside from difficult with ambulation around his room.  This is a recurrent problem.  Review of Systems  Positive: fall Negative: Chest pain, shortness of breath  Physical Exam  There were no vitals taken for this visit. Gen:   Awake, no distress   Resp:  Normal effort  MSK:   Moves extremities without difficulty  Other:  Moves all upper and lower extremities, lungs are clear to auscultation.  Medical Decision Making  Medically screening exam initiated at 4:34 PM.  Appropriate orders placed.  Darryl Diaz was informed that the remainder of the evaluation will be completed by another provider, this initial triage assessment does not replace that evaluation, and the importance of remaining in the ED until their evaluation is complete.  Patient brought in by EMS status post fall.  Recently taken off Eliquis approximately 1 week ago.  He is reporting no pain or complaints, does have difficulty with his gait at facility.    Janeece Fitting, PA-C 01/16/21 1637    Long, W[facility], MD 01/24/21 1758

## 2021-01-16 NOTE — ED Triage Notes (Signed)
Pt to triage via [facility] from [facility].  C/o multiple falls in the last week with urinary incontinence and painful urination.  Taken off Eliquis 1 week ago and possibly hit head when he fell.  Denies injury from fall.

## 2021-01-17 ENCOUNTER — Emergency Department (HOSPITAL_COMMUNITY): Payer: Medicare Other

## 2021-01-17 ENCOUNTER — Encounter (HOSPITAL_COMMUNITY): Payer: Self-pay | Admitting: Internal Medicine

## 2021-01-17 ENCOUNTER — Inpatient Hospital Stay (HOSPITAL_COMMUNITY): Payer: Medicare Other

## 2021-01-17 DIAGNOSIS — I4891 Unspecified atrial fibrillation: Secondary | ICD-10-CM | POA: Diagnosis not present

## 2021-01-17 DIAGNOSIS — Z7401 Bed confinement status: Secondary | ICD-10-CM | POA: Diagnosis not present

## 2021-01-17 DIAGNOSIS — M6281 Muscle weakness (generalized): Secondary | ICD-10-CM | POA: Diagnosis not present

## 2021-01-17 DIAGNOSIS — I5042 Chronic combined systolic (congestive) and diastolic (congestive) heart failure: Secondary | ICD-10-CM | POA: Diagnosis not present

## 2021-01-17 DIAGNOSIS — S2231XA Fracture of one rib, right side, initial encounter for closed fracture: Secondary | ICD-10-CM | POA: Diagnosis not present

## 2021-01-17 DIAGNOSIS — R609 Edema, unspecified: Secondary | ICD-10-CM | POA: Diagnosis not present

## 2021-01-17 DIAGNOSIS — D500 Iron deficiency anemia secondary to blood loss (chronic): Secondary | ICD-10-CM | POA: Diagnosis present

## 2021-01-17 DIAGNOSIS — N390 Urinary tract infection, site not specified: Secondary | ICD-10-CM | POA: Diagnosis present

## 2021-01-17 DIAGNOSIS — Z66 Do not resuscitate: Secondary | ICD-10-CM | POA: Diagnosis present

## 2021-01-17 DIAGNOSIS — D649 Anemia, unspecified: Secondary | ICD-10-CM | POA: Diagnosis present

## 2021-01-17 DIAGNOSIS — R296 Repeated falls: Secondary | ICD-10-CM | POA: Diagnosis not present

## 2021-01-17 DIAGNOSIS — N058 Unspecified nephritic syndrome with other morphologic changes: Secondary | ICD-10-CM | POA: Diagnosis not present

## 2021-01-17 DIAGNOSIS — L309 Dermatitis, unspecified: Secondary | ICD-10-CM | POA: Diagnosis not present

## 2021-01-17 DIAGNOSIS — R822 Biliuria: Secondary | ICD-10-CM | POA: Diagnosis present

## 2021-01-17 DIAGNOSIS — I130 Hypertensive heart and chronic kidney disease with heart failure and stage 1 through stage 4 chronic kidney disease, or unspecified chronic kidney disease: Secondary | ICD-10-CM | POA: Diagnosis present

## 2021-01-17 DIAGNOSIS — I5032 Chronic diastolic (congestive) heart failure: Secondary | ICD-10-CM | POA: Diagnosis present

## 2021-01-17 DIAGNOSIS — I10 Essential (primary) hypertension: Secondary | ICD-10-CM | POA: Diagnosis not present

## 2021-01-17 DIAGNOSIS — I495 Sick sinus syndrome: Secondary | ICD-10-CM | POA: Diagnosis present

## 2021-01-17 DIAGNOSIS — F329 Major depressive disorder, single episode, unspecified: Secondary | ICD-10-CM | POA: Diagnosis present

## 2021-01-17 DIAGNOSIS — L0390 Cellulitis, unspecified: Secondary | ICD-10-CM | POA: Diagnosis not present

## 2021-01-17 DIAGNOSIS — L03115 Cellulitis of right lower limb: Secondary | ICD-10-CM

## 2021-01-17 DIAGNOSIS — K922 Gastrointestinal hemorrhage, unspecified: Secondary | ICD-10-CM | POA: Diagnosis not present

## 2021-01-17 DIAGNOSIS — R531 Weakness: Secondary | ICD-10-CM | POA: Diagnosis not present

## 2021-01-17 DIAGNOSIS — A419 Sepsis, unspecified organism: Secondary | ICD-10-CM | POA: Diagnosis present

## 2021-01-17 DIAGNOSIS — R0602 Shortness of breath: Secondary | ICD-10-CM | POA: Diagnosis not present

## 2021-01-17 DIAGNOSIS — F39 Unspecified mood [affective] disorder: Secondary | ICD-10-CM | POA: Diagnosis not present

## 2021-01-17 DIAGNOSIS — N3000 Acute cystitis without hematuria: Secondary | ICD-10-CM | POA: Diagnosis not present

## 2021-01-17 DIAGNOSIS — N184 Chronic kidney disease, stage 4 (severe): Secondary | ICD-10-CM | POA: Diagnosis present

## 2021-01-17 DIAGNOSIS — W19XXXA Unspecified fall, initial encounter: Secondary | ICD-10-CM | POA: Diagnosis present

## 2021-01-17 DIAGNOSIS — M7989 Other specified soft tissue disorders: Secondary | ICD-10-CM | POA: Diagnosis not present

## 2021-01-17 DIAGNOSIS — R601 Generalized edema: Secondary | ICD-10-CM | POA: Diagnosis not present

## 2021-01-17 DIAGNOSIS — E872 Acidosis: Secondary | ICD-10-CM | POA: Diagnosis present

## 2021-01-17 DIAGNOSIS — B964 Proteus (mirabilis) (morganii) as the cause of diseases classified elsewhere: Secondary | ICD-10-CM | POA: Diagnosis present

## 2021-01-17 DIAGNOSIS — R2689 Other abnormalities of gait and mobility: Secondary | ICD-10-CM | POA: Diagnosis not present

## 2021-01-17 DIAGNOSIS — Z20822 Contact with and (suspected) exposure to covid-19: Secondary | ICD-10-CM | POA: Diagnosis present

## 2021-01-17 DIAGNOSIS — K760 Fatty (change of) liver, not elsewhere classified: Secondary | ICD-10-CM | POA: Diagnosis present

## 2021-01-17 DIAGNOSIS — M5136 Other intervertebral disc degeneration, lumbar region: Secondary | ICD-10-CM | POA: Diagnosis not present

## 2021-01-17 DIAGNOSIS — M79671 Pain in right foot: Secondary | ICD-10-CM | POA: Diagnosis not present

## 2021-01-17 DIAGNOSIS — K7030 Alcoholic cirrhosis of liver without ascites: Secondary | ICD-10-CM | POA: Diagnosis present

## 2021-01-17 DIAGNOSIS — R278 Other lack of coordination: Secondary | ICD-10-CM | POA: Diagnosis not present

## 2021-01-17 DIAGNOSIS — F1021 Alcohol dependence, in remission: Secondary | ICD-10-CM | POA: Diagnosis present

## 2021-01-17 DIAGNOSIS — E785 Hyperlipidemia, unspecified: Secondary | ICD-10-CM | POA: Diagnosis not present

## 2021-01-17 DIAGNOSIS — J309 Allergic rhinitis, unspecified: Secondary | ICD-10-CM | POA: Diagnosis not present

## 2021-01-17 DIAGNOSIS — N19 Unspecified kidney failure: Secondary | ICD-10-CM | POA: Diagnosis not present

## 2021-01-17 DIAGNOSIS — I951 Orthostatic hypotension: Secondary | ICD-10-CM | POA: Diagnosis not present

## 2021-01-17 DIAGNOSIS — R809 Proteinuria, unspecified: Secondary | ICD-10-CM | POA: Diagnosis present

## 2021-01-17 DIAGNOSIS — B958 Unspecified staphylococcus as the cause of diseases classified elsewhere: Secondary | ICD-10-CM | POA: Diagnosis present

## 2021-01-17 DIAGNOSIS — I4819 Other persistent atrial fibrillation: Secondary | ICD-10-CM | POA: Diagnosis present

## 2021-01-17 DIAGNOSIS — J811 Chronic pulmonary edema: Secondary | ICD-10-CM | POA: Diagnosis not present

## 2021-01-17 DIAGNOSIS — E871 Hypo-osmolality and hyponatremia: Secondary | ICD-10-CM | POA: Diagnosis present

## 2021-01-17 DIAGNOSIS — M4056 Lordosis, unspecified, lumbar region: Secondary | ICD-10-CM | POA: Diagnosis not present

## 2021-01-17 DIAGNOSIS — R52 Pain, unspecified: Secondary | ICD-10-CM | POA: Diagnosis present

## 2021-01-17 DIAGNOSIS — I482 Chronic atrial fibrillation, unspecified: Secondary | ICD-10-CM | POA: Diagnosis not present

## 2021-01-17 DIAGNOSIS — N179 Acute kidney failure, unspecified: Secondary | ICD-10-CM | POA: Diagnosis not present

## 2021-01-17 DIAGNOSIS — N057 Unspecified nephritic syndrome with diffuse crescentic glomerulonephritis: Secondary | ICD-10-CM | POA: Diagnosis not present

## 2021-01-17 LAB — ECHOCARDIOGRAM COMPLETE
AR max vel: 3.08 cm2
AV Area VTI: 3.2 cm2
AV Area mean vel: 3.04 cm2
AV Mean grad: 12 mmHg
AV Peak grad: 22.1 mmHg
Ao pk vel: 2.35 m/s
Height: 72 in
S' Lateral: 3.5 cm
Single Plane A4C EF: 62.9 %
Weight: 3703.73 oz

## 2021-01-17 LAB — RESP PANEL BY RT-PCR (FLU A&B, COVID) ARPGX2
Influenza A by PCR: NEGATIVE
Influenza B by PCR: NEGATIVE
SARS Coronavirus 2 by RT PCR: NEGATIVE

## 2021-01-17 LAB — BASIC METABOLIC PANEL
Anion gap: 14 (ref 5–15)
BUN: 57 mg/dL — ABNORMAL HIGH (ref 8–23)
CO2: 18 mmol/L — ABNORMAL LOW (ref 22–32)
Calcium: 8.3 mg/dL — ABNORMAL LOW (ref 8.9–10.3)
Chloride: 98 mmol/L (ref 98–111)
Creatinine, Ser: 4.29 mg/dL — ABNORMAL HIGH (ref 0.61–1.24)
GFR, Estimated: 13 mL/min — ABNORMAL LOW (ref >60)
Glucose, Bld: 101 mg/dL — ABNORMAL HIGH (ref 70–99)
Potassium: 3.4 mmol/L — ABNORMAL LOW (ref 3.5–5.1)
Sodium: 130 mmol/L — ABNORMAL LOW (ref 135–145)

## 2021-01-17 LAB — URINALYSIS, MICROSCOPIC (REFLEX)
RBC / HPF: >50 RBC/hpf (ref 0–5)
WBC, UA: >50 WBC/hpf (ref 0–5)

## 2021-01-17 LAB — URINALYSIS, ROUTINE W REFLEX MICROSCOPIC
Glucose, UA: NEGATIVE mg/dL
Ketones, ur: NEGATIVE mg/dL
Nitrite: POSITIVE — AB
Protein, ur: >300 mg/dL — AB
Specific Gravity, Urine: 1.01 (ref 1.005–1.030)
pH: 7 (ref 5.0–8.0)

## 2021-01-17 LAB — TROPONIN I (HIGH SENSITIVITY)
Troponin I (High Sensitivity): 31 ng/L — ABNORMAL HIGH (ref <18)
Troponin I (High Sensitivity): 69 ng/L — ABNORMAL HIGH (ref <18)
Troponin I (High Sensitivity): 31 ng/L — ABNORMAL HIGH (ref <18)
Troponin I (High Sensitivity): 31 ng/L — ABNORMAL HIGH (ref <18)

## 2021-01-17 LAB — LACTIC ACID, PLASMA: Lactic Acid, Venous: 1 mmol/L (ref 0.5–1.9)

## 2021-01-17 MED ORDER — METHOCARBAMOL 500 MG PO TABS: 500.0000 mg | ORAL_TABLET | Freq: Four times a day (QID) | ORAL | Status: DC | PRN

## 2021-01-17 MED ORDER — POTASSIUM CHLORIDE CRYS ER 20 MEQ PO TBCR
40.0000 meq | EXTENDED_RELEASE_TABLET | Freq: Once | ORAL | Status: AC
  Administered 2021-01-17: 40 meq via ORAL
  Filled 2021-01-17: qty 2

## 2021-01-17 MED ORDER — SODIUM CHLORIDE 0.9 % IV SOLN: INTRAVENOUS | Status: DC

## 2021-01-17 MED ORDER — NYSTATIN 100000 UNIT/GM EX POWD
Freq: Two times a day (BID) | CUTANEOUS | Status: DC
  Filled 2021-01-17 (×2): qty 15

## 2021-01-17 MED ORDER — SODIUM CHLORIDE 0.9 % IV SOLN
INTRAVENOUS | Status: DC
Start: 2021-01-17 — End: 2021-01-20

## 2021-01-17 MED ORDER — SODIUM CHLORIDE 0.9 % IV SOLN
1.0000 g | Freq: Once | INTRAVENOUS | Status: AC
  Administered 2021-01-17: 1 g via INTRAVENOUS
  Filled 2021-01-17: qty 10

## 2021-01-17 MED ORDER — HYDRALAZINE HCL 50 MG PO TABS
50.0000 mg | ORAL_TABLET | Freq: Three times a day (TID) | ORAL | Status: DC
  Administered 2021-01-17 – 2021-01-18 (×5): 50 mg via ORAL
  Filled 2021-01-17 (×2): qty 1
  Filled 2021-01-17: qty 2
  Filled 2021-01-17: qty 1
  Filled 2021-01-17: qty 2
  Filled 2021-01-17: qty 1

## 2021-01-17 MED ORDER — CEFEPIME HCL 2 G IJ SOLR
2.0000 g | INTRAMUSCULAR | Status: DC
  Administered 2021-01-17 – 2021-01-19 (×3): 2 g via INTRAVENOUS
  Filled 2021-01-17 (×3): qty 2

## 2021-01-17 MED ORDER — CARVEDILOL 6.25 MG PO TABS
6.2500 mg | ORAL_TABLET | Freq: Two times a day (BID) | ORAL | Status: DC
  Administered 2021-01-17 – 2021-01-22 (×12): 6.25 mg via ORAL
  Filled 2021-01-17: qty 2
  Filled 2021-01-17 (×9): qty 1
  Filled 2021-01-17: qty 2
  Filled 2021-01-17: qty 1

## 2021-01-17 MED ORDER — ACETAMINOPHEN 325 MG PO TABS
650.0000 mg | ORAL_TABLET | Freq: Four times a day (QID) | ORAL | Status: DC | PRN
  Administered 2021-01-18: 650 mg via ORAL
  Filled 2021-01-17: qty 2

## 2021-01-17 MED ORDER — VANCOMYCIN HCL IN DEXTROSE 1-5 GM/200ML-% IV SOLN: 1000.0000 mg | Freq: Once | INTRAVENOUS | Status: DC

## 2021-01-17 MED ORDER — ATORVASTATIN CALCIUM 10 MG PO TABS
20.0000 mg | ORAL_TABLET | Freq: Every day | ORAL | Status: DC
  Administered 2021-01-17 – 2021-01-22 (×6): 20 mg via ORAL
  Filled 2021-01-17 (×6): qty 2

## 2021-01-17 MED ORDER — ONDANSETRON HCL 4 MG PO TABS: 4.0000 mg | ORAL_TABLET | Freq: Four times a day (QID) | ORAL | Status: DC | PRN

## 2021-01-17 MED ORDER — VANCOMYCIN HCL 1500 MG/300ML IV SOLN
1500.0000 mg | INTRAVENOUS | Status: DC
  Administered 2021-01-19 – 2021-01-21 (×2): 1500 mg via INTRAVENOUS
  Filled 2021-01-17 (×2): qty 300

## 2021-01-17 MED ORDER — NITROGLYCERIN 0.4 MG SL SUBL: 0.4000 mg | SUBLINGUAL_TABLET | SUBLINGUAL | Status: DC | PRN

## 2021-01-17 MED ORDER — FOLIC ACID 1 MG PO TABS
1.0000 mg | ORAL_TABLET | Freq: Every day | ORAL | Status: DC
  Administered 2021-01-17 – 2021-01-22 (×6): 1 mg via ORAL
  Filled 2021-01-17 (×6): qty 1

## 2021-01-17 MED ORDER — ACETAMINOPHEN 650 MG RE SUPP: 650.0000 mg | Freq: Four times a day (QID) | RECTAL | Status: DC | PRN

## 2021-01-17 MED ORDER — SODIUM CHLORIDE 0.9 % IV BOLUS
1000.0000 mL | Freq: Once | INTRAVENOUS | Status: AC
  Administered 2021-01-17: 1000 mL via INTRAVENOUS

## 2021-01-17 MED ORDER — TRAZODONE HCL 50 MG PO TABS
50.0000 mg | ORAL_TABLET | Freq: Every evening | ORAL | Status: DC | PRN
  Administered 2021-01-20 – 2021-01-21 (×2): 50 mg via ORAL
  Filled 2021-01-17 (×2): qty 1

## 2021-01-17 MED ORDER — PANTOPRAZOLE SODIUM 40 MG PO TBEC
40.0000 mg | DELAYED_RELEASE_TABLET | Freq: Every day | ORAL | Status: DC
  Administered 2021-01-17 – 2021-01-22 (×6): 40 mg via ORAL
  Filled 2021-01-17 (×6): qty 1

## 2021-01-17 MED ORDER — VANCOMYCIN HCL 2000 MG/400ML IV SOLN
2000.0000 mg | Freq: Once | INTRAVENOUS | Status: AC
  Administered 2021-01-17: 2000 mg via INTRAVENOUS
  Filled 2021-01-17 (×2): qty 400

## 2021-01-17 MED ORDER — MAGNESIUM OXIDE -MG SUPPLEMENT 400 (240 MG) MG PO TABS
400.0000 mg | ORAL_TABLET | Freq: Every day | ORAL | Status: DC
  Administered 2021-01-17 – 2021-01-22 (×6): 400 mg via ORAL
  Filled 2021-01-17 (×6): qty 1

## 2021-01-17 MED ORDER — FERROUS SULFATE 325 (65 FE) MG PO TABS
325.0000 mg | ORAL_TABLET | Freq: Every day | ORAL | Status: DC
  Administered 2021-01-17 – 2021-01-22 (×6): 325 mg via ORAL
  Filled 2021-01-17 (×6): qty 1

## 2021-01-17 MED ORDER — LORATADINE 10 MG PO TABS: 10.0000 mg | ORAL_TABLET | Freq: Every day | ORAL | Status: DC | PRN

## 2021-01-17 MED ORDER — ESCITALOPRAM OXALATE 10 MG PO TABS
10.0000 mg | ORAL_TABLET | Freq: Every day | ORAL | Status: DC
  Administered 2021-01-17 – 2021-01-22 (×6): 10 mg via ORAL
  Filled 2021-01-17 (×6): qty 1

## 2021-01-17 MED ORDER — AMIODARONE HCL 200 MG PO TABS
200.0000 mg | ORAL_TABLET | Freq: Every day | ORAL | Status: DC
  Administered 2021-01-17 – 2021-01-22 (×6): 200 mg via ORAL
  Filled 2021-01-17 (×6): qty 1

## 2021-01-17 MED ORDER — SODIUM CHLORIDE 0.9 % IV BOLUS
500.0000 mL | Freq: Once | INTRAVENOUS | Status: AC
  Administered 2021-01-17: 500 mL via INTRAVENOUS

## 2021-01-17 MED ORDER — ONDANSETRON HCL 4 MG/2ML IJ SOLN: 4.0000 mg | Freq: Four times a day (QID) | INTRAMUSCULAR | Status: DC | PRN

## 2021-01-17 MED ORDER — KETOCONAZOLE 2 % EX CREA
1.0000 "application " | TOPICAL_CREAM | Freq: Two times a day (BID) | CUTANEOUS | Status: DC | PRN
  Filled 2021-01-17: qty 15

## 2021-01-17 NOTE — ED Notes (Signed)
Report given to [facility] RN

## 2021-01-17 NOTE — ED Notes (Signed)
Diaper changed, complete bed lined changed. Pericare done.

## 2021-01-17 NOTE — H&P (Addendum)
History and Physical    Darryl Diaz ERX:[id number] DOB: 1941/01/17 DOA: 01/16/2021  PCP: [name], MD   Patient coming from: [place].  I have personally briefly reviewed patient's old medical records in [place]  Chief Complaint: Urinary incontinence and painful urination.  HPI: Darryl Diaz is a 80 year old male with a past medical history of chronic diastolic CHF, stage IV CKD, tachycardia-bradycardia syndrome, hypertension, hyperlipidemia, history of GI bleed, paroxysmal atrial fibrillation no longer on anticoagulation after being admitted for symptomatic anemia in the setting of lower GI bleed.  He was sent to the ED due to having multiple falls in the past week, urinary incontinence and painful urination.  He stated he has been occasionally lightheaded.  He denied fever, chills, night sweats, rhinorrhea, sore throat, wheezing, productive cough or hemoptysis.  He is occasionally dyspneic.  No recent chest pain, palpitations, diaphoresis, PND or orthopnea.  He has chronic lower extremity edema.  Denied abdominal pain, nausea, emesis, diarrhea, constipation, melena or hematochezia.  No dysuria, frequency or hematuria.  No polyuria, polydipsia, polyphagia or blurred vision.  ED Course: Initial vital signs were 98.2 F, pulse 96, respirations 16, BP 145 to 60 mmHg O2 sat 100% on room air.  The patient received 1500 mL of NS bolus, a gram of ceftriaxone and vancomycin per pharmacy.  Dr. Wyvonnia Dusky removed the needle from the patient's right foot plantar area.  He thinks he may have felt something in his foot a few days ago.  Lab work: Urinalysis showed large hemoglobinuria, moderate bilirubinuria proteinuria more than 300 mg/dL with positive nitrites and large leukocyte esterase.  RBC more than 15, WBC more than 50 and a few bacteria on microscopic examination.  CBC showed a white count of 15.7 with 91% neutrophils, hemoglobin 8.6 g/dL and platelets 238.  PT was 14.9 INR  1.2.  BNP was 315 1.8 pg/mL.  Troponin was 31 ng/L and second set is pending.  CMP showed a sodium of 129, potassium 3.5, chloride 94 and CO2 20 mmol/L.  Anion gap was normal.  Glucose 112, BUN 53 and creatinine 4.29 mg/dL.  His recent baseline creatinine ranges from 2.8 to 3.4 mg/dL.  Blood cultures x2 were drawn.  Imaging: 2 views of the right foot show severe soft tissue swelling with no osseous abnormality identified.  There was a 95KD linear metallic retained foreign body along the plantar surface of the second and third metatarsal heads likely a needle fragment.  Lumbar spine show diffuse severe spine degeneration but no acute osseous abnormality.  There was aortic atherosclerosis.  1 view chest radiograph shows lower lung volumes with patchy and bailing bibasilar opacity, diffuse pulmonary vascular congestion.  Chronic rib fractures.  Please see images and full radiology report for further detail.  Review of Systems: As per HPI otherwise all other systems reviewed and are negative.  Past Medical History:  Diagnosis Date   Alcohol abuse 08/02/7122   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[place]) 03/17/2020   Alcoholism ([place])    in remission following wife's death   Anemia    Ascending aorta dilation (HCC)    Cardiac arrest ([place]) 04/20/2019   12 min CPR with epi   Chronic diastolic CHF (congestive heart failure) (HCC)    Fatty liver    Gastritis    GERD (gastroesophageal reflux disease)    H/O seasonal allergies    History of GI bleed    Hx of adenomatous colonic polyps 08/10/2017   Hyperlipidemia  Hypertension    Insomnia    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   Mallory-Weiss tear    OA (osteoarthritis)    hands   OSA (obstructive sleep apnea)    No longer uses CPAP   Persistent atrial fibrillation with rapid ventricular response ([place]) 02/05/2020   Recurrent syncope    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[facility])    Past Surgical History:  Procedure Laterality Date    ANKLE SURGERY Left 2014   had rods put in    BIOPSY  02/28/2019   Procedure: BIOPSY;  Surgeon: Milus Banister, MD;  Location: [facility];  Service: Endoscopy;;   CARDIOVERSION  02/06/2020   CARDIOVERSION N/A 02/06/2020   Procedure: CARDIOVERSION;  Surgeon: Werner Lean, MD;  Location: [facility] ENDOSCOPY;  Service: Cardiovascular;  Laterality: N/A;   CARDIOVERSION N/A 02/24/2020   Procedure: CARDIOVERSION;  Surgeon: Werner Lean, MD;  Location: [place];  Service: Cardiovascular;  Laterality: N/A;   COLONOSCOPY     [place]   COLONOSCOPY WITH PROPOFOL N/A 01/08/2021   Procedure: COLONOSCOPY WITH PROPOFOL;  Surgeon: Milus Banister, MD;  Location: [facility] ENDOSCOPY;  Service: Endoscopy;  Laterality: N/A;   ESOPHAGOGASTRODUODENOSCOPY (EGD) WITH PROPOFOL N/A 02/28/2019   Procedure: ESOPHAGOGASTRODUODENOSCOPY (EGD) WITH PROPOFOL;  Surgeon: Milus Banister, MD;  Location: [facility] ENDOSCOPY;  Service: Endoscopy;  Laterality: N/A;   HIP ARTHROPLASTY Left 04/01/2016   Procedure: ARTHROPLASTY BIPOLAR HIP (HEMIARTHROPLASTY);  Surgeon: Paralee Cancel, MD;  Location: [place];  Service: Orthopedics;  Laterality: Left;   HIP CLOSED REDUCTION Left 04/30/2016   Procedure: CLOSED REDUCTION HIP;  Surgeon: Wylene Simmer, MD;  Location: [place];  Service: Orthopedics;  Laterality: Left;   HOT HEMOSTASIS N/A 01/08/2021   Procedure: HOT HEMOSTASIS (ARGON PLASMA COAGULATION/BICAP);  Surgeon: Milus Banister, MD;  Location: [facility] END[facility]COPY;  Service: Endoscopy;  Laterality: N/A;   IR RADIOLOGIST EVAL & MGMT  12/31/2020   TONSILLECTOMY     TOTAL HIP ARTHROPLASTY Right 10/15/2019   Procedure: TOTAL HIP ARTHROPLASTY ANTERIOR APPROACH;  Surgeon: Paralee Cancel, MD;  Location: [facility];  Service: Orthopedics;  Laterality: Right;  70 mins   Social History  reports that he has quit smoking. His smoking use included cigarettes. He has a 20.00 pack-year smoking history. He has never used smokeless tobacco. He reports that he  does not currently use alcohol. He reports that he does not use drugs.  Allergies  Allergen Reactions   Penicillin G Sodium     Other reaction(s): rash   Penicillins Other (See Comments)    Tolerated Cephalosporin Date: 10/15/19. Allergy is from childhood Did it involve swelling of the face/tongue/throat, SOB, or low BP? Unknown Did it involve sudden or severe rash/hives, skin peeling, or any reaction on the inside of your mouth or nose? Unknown Did you need to seek medical attention at a hospital or doctor's office? Unknown When did it last happen?    childhood reaction   If all above answers are "NO", may proceed with cephalosporin use.   Penicillins    Family History  Problem Relation Age of Onset   Heart disease Mother 68   Hypertension Mother    Arthritis Mother    Heart failure Father 43   Stroke Maternal Aunt    Heart failure Sister    Colon cancer Neg Hx    Esophageal cancer Neg Hx    Stomach cancer Neg Hx    Rectal cancer Neg Hx    Prior to Admission medications  Medication Sig Start Date End Date Taking? Authorizing Provider  acetaminophen (TYLENOL) 325 MG tablet Take 2 tablets (650 mg total) by mouth every 4 (four) hours as needed for headache or mild pain. 03/10/20   Angiulli, Lavon Paganini, PA-C  amiodarone (PACERONE) 200 MG tablet Take 1 tablet (200 mg total) by mouth daily. Patient taking differently: Take 200 mg by mouth daily with lunch. 04/10/20   Dunn, Nedra Hai, PA-C  atorvastatin (LIPITOR) 20 MG tablet Take 1 tablet (20 mg total) by mouth daily. Patient taking differently: Take 20 mg by mouth daily with lunch. 03/10/20   Angiulli, Lavon Paganini, PA-C  carvedilol (COREG) 6.25 MG tablet Take 1 tablet (6.25 mg total) by mouth 2 (two) times daily with a meal. 01/09/21   Thurnell Lose, MD  escitalopram (LEXAPRO) 10 MG tablet Take 1 tablet (10 mg total) by mouth daily. Patient taking differently: Take 10 mg by mouth daily with lunch. 03/10/20   Angiulli, Lavon Paganini, PA-C   ferrous sulfate 325 (65 FE) MG tablet Take 1 tablet (325 mg total) by mouth daily. Patient taking differently: Take 325 mg by mouth daily with lunch. 12/03/20 01/06/21  Aline August, MD  folic acid (FOLVITE) 1 MG tablet Take 1 tablet (1 mg total) by mouth daily. Patient taking differently: Take 1 mg by mouth daily with lunch. 03/10/20   Angiulli, Lavon Paganini, PA-C  hydrALAZINE (APRESOLINE) 50 MG tablet Take 1 tablet (50 mg total) by mouth 3 (three) times daily. 01/09/21   Thurnell Lose, MD  ketoconazole (NIZORAL) 2 % cream Apply 1 application topically daily. To dry scaly skin on feet Patient taking differently: Apply 1 application topically 2 (two) times daily as needed (dry scaly skin on feet). 07/23/20   McDonald, Stephan Minister, DPM  loratadine (CLARITIN) 10 MG tablet Take 1 tablet (10 mg total) by mouth daily. Patient taking differently: Take 10 mg by mouth daily as needed for allergies. 02/12/20   Medina-Vargas, Monina C, NP  Magnesium Oxide 400 MG CAPS Take 1 capsule (400 mg total) by mouth daily. Patient taking differently: Take 400 mg by mouth daily with lunch. 10/17/20   Eugenie Filler, MD  methocarbamol (ROBAXIN) 500 MG tablet Take 1 tablet (500 mg total) by mouth every 6 (six) hours as needed for muscle spasms. 12/03/20   Aline August, MD  Multiple Vitamins-Minerals (CENTRUM SILVER 50+MEN) TABS Take 1 tablet by mouth daily with breakfast.    [provider]  nitroGLYCERIN (NITROSTAT) 0.4 MG SL tablet Place 1 tablet (0.4 mg total) under the tongue every 5 (five) minutes x 3 doses as needed for chest pain. 07/28/20   Baldwin Jamaica, PA-C  pantoprazole (PROTONIX) 40 MG tablet Take 1 tablet (40 mg total) by mouth daily. Patient taking differently: Take 40 mg by mouth daily with lunch. 03/10/20   Angiulli, Lavon Paganini, PA-C  potassium chloride SA (KLOR-CON) 20 MEQ tablet Take 1 tablet (20 mEq total) by mouth daily. Patient taking differently: Take 20 mEq by mouth daily with lunch. 10/17/20  10/12/21  Eugenie Filler, MD  torsemide (DEMADEX) 20 MG tablet Take 60 mg by mouth See admin instructions. 60 mg every morning, may take 60 mg in the afternoon as needed for swelling    [provider]  traZODone (DESYREL) 50 MG tablet Take 1 tablet (50 mg total) by mouth at bedtime. 03/10/20   Cathlyn Parsons, PA-C   Physical Exam: Vitals:   01/16/21 2316 01/17/21 0400 01/17/21 0415 01/17/21 0459  BP: (!) 148/52 (!) 156/62 (!) 160/56   Pulse: 84 94 99   Resp: 16 (!) 28 (!) 27   Temp:      TempSrc:      SpO2: 99% 100% 100%   Weight:    105 kg  Height:    6' (1.829 m)   Constitutional: Chronically ill-appearing, but currently in NAD, calm, comfortable. Eyes: PERRL, lids and conjunctivae normal ENMT: Mucous membranes are mildly dry. Posterior pharynx clear of any exudate or lesions. Neck: normal, supple, no masses, no thyromegaly Respiratory: clear to auscultation bilaterally, no wheezing, no crackles.   Normal respiratory effort. No accessory muscle use.  Cardiovascular: Regular rate and rhythm, no murmurs / rubs / gallops. 2+ lower extremity pitting edema. 2+ pedal pulses. No carotid bruits.  Abdomen: Obese, no distention.  Bowel sounds positive.  Soft, no tenderness, no masses palpated. No hepatosplenomegaly.  Musculoskeletal: Mild generalized weakness.  No clubbing / cyanosis. Good ROM, no contractures. Normal muscle tone.  Skin: Positive edema, erythema, calor, TTP with a distal large pustular bulla on the distal dorsal aspect of the right foot.  Please see pictures below.  Hyperkeratosis and skin desquamation of both pretibial areas more pronounced on the left.  Onychomycosis of multiple toenails. Neurologic: CN 2-12 grossly intact. Sensation intact, DTR normal. Strength 5/5 in all 4.  Psychiatric: Normal judgment and insight. Alert and oriented x 3. Normal mood.        Labs on Admission: I have personally reviewed following labs and imaging studies  CBC: Recent  Labs  Lab 01/16/21 1633  WBC 15.7*  NEUTROABS 14.2*  HGB 8.6*  HCT 25.4*  MCV 94.4  PLT 283   Basic Metabolic Panel: Recent Labs  Lab 01/16/21 1633  NA 129*  K 3.5  CL 94*  CO2 20*  GLUCOSE 112*  BUN 53*  CREATININE 4.29*  CALCIUM 9.0    GFR: Estimated Creatinine Clearance: 17.5 mL/min (A) (by C-G formula based on SCr of 4.29 mg/dL (H)).  Liver Function Tests: Recent Labs  Lab 01/16/21 1633  AST 23  ALT 14  ALKPHOS 72  BILITOT 1.0  PROT 6.2*  ALBUMIN 3.0*   Urine analysis:    Component Value Date/Time   COLORURINE AMBER (A) 01/17/2021 0425   APPEARANCEUR CLOUDY (A) 01/17/2021 0425   LABSPEC 1.010 01/17/2021 0425   PHURINE 7.0 01/17/2021 0425   GLUCOSEU NEGATIVE 01/17/2021 0425   HGBUR LARGE (A) 01/17/2021 0425   BILIRUBINUR MODERATE (A) 01/17/2021 0425   KETONESUR NEGATIVE 01/17/2021 0425   PROTEINUR >300 (A) 01/17/2021 0425   NITRITE POSITIVE (A) 01/17/2021 0425   LEUKOCYTESUR LARGE (A) 01/17/2021 0425   Radiological Exams on Admission: DG Chest 1 View  Result Date: 01/17/2021 CLINICAL DATA:  80 year old male with shortness of breath. Multiple falls. EXAM: CHEST  1 VIEW COMPARISON:  Portable chest 09/08/2020 and earlier. FINDINGS: Portable AP view at 0426 hours. Low lung volumes similar to those in May. Continued patchy and veiling bibasilar lung opacity, new from last year. Associated increased pulmonary vascularity. No pneumothorax. No air bronchograms. Stable cardiac size and mediastinal contours. Visualized tracheal air column is within normal limits. Multilevel right lateral rib fractures again noted. Paucity of bowel gas in the upper abdomen. IMPRESSION: 1. Lower lung volumes with patchy and veiling bibasilar opacity, diffuse pulmonary vascular congestion. Suspect interstitial edema with bibasilar atelectasis and possible small pleural effusions. These findings are new from last year and unchanged compared to May. 2. Chronic right rib fractures.  Electronically  Signed   By: Genevie Ann M.D.   On: 01/17/2021 05:27   DG Lumbar Spine Complete  Result Date: 01/17/2021 CLINICAL DATA:  80 year old male with shortness of breath. Multiple falls. EXAM: LUMBAR SPINE - COMPLETE 4+ VIEW COMPARISON:  Lumbar radiographs 11/25/2020. FINDINGS: Normal lumbar segmentation. Stable lumbar lordosis. Stable vertebral height and alignment. Diffuse severe disc space loss and bulky endplate spurring in the lower thoracic and lumbar spine. Widespread lumbar facet hypertrophy. No pars fracture. Visible sacrum and SI joints appear intact. No acute osseous abnormality identified. Bilateral hip arthroplasty. Abdominal Calcified aortic atherosclerosis. Visible bowel-gas pattern is within normal limits. IMPRESSION: 1. Diffuse severe spine degeneration. No acute osseous abnormality identified in the lumbar spine. 2.  Aortic Atherosclerosis (ICD10-I70.0). Electronically Signed   By: Genevie Ann M.D.   On: 01/17/2021 05:24   CT HEAD WO CONTRAST (5MM)  Result Date: 01/16/2021 CLINICAL DATA:  Head trauma. EXAM: CT HEAD WITHOUT CONTRAST TECHNIQUE: Contiguous axial images were obtained from the base of the skull through the vertex without intravenous contrast. COMPARISON:  Sep 07, 2020 FINDINGS: Brain: No evidence of acute infarction, hemorrhage, hydrocephalus, extra-axial collection or mass lesion/mass effect. Severe brain parenchymal volume loss and deep white matter microangiopathy. Vascular: No hyperdense vessel or unexpected calcification. Skull: Normal. Negative for fracture or focal lesion. Sinuses/Orbits: No acute finding. Other: None. IMPRESSION: 1. No acute intracranial abnormality. 2. Severe brain parenchymal atrophy and chronic microvascular disease. Electronically Signed   By: Fidela Salisbury M.D.   On: 01/16/2021 17:31   CT Cervical Spine Wo Contrast  Result Date: 01/16/2021 CLINICAL DATA:  [place], neck trauma EXAM: CT CERVICAL SPINE WITHOUT CONTRAST TECHNIQUE: Multidetector CT  imaging of the cervical spine was performed without intravenous contrast. Multiplanar CT image reconstructions were also generated. COMPARISON:  07/08/2020 FINDINGS: Alignment: Alignment is grossly anatomic and stable. Skull base and vertebrae: No acute fracture. No primary bone lesion or focal pathologic process. Soft tissues and spinal canal: No prevertebral fluid or swelling. No visible canal hematoma. Disc levels: Stable multilevel spondylosis and facet hypertrophy. Disc space narrowing and osteophyte formation are greatest at the C5-6, C6-7, and C7-T1 levels. There is marked facet hypertrophy from C2 through C5. Upper chest: Central airway is patent.  Lung apices are clear. Other: Reconstructed images demonstrate no additional findings. IMPRESSION: 1. No acute cervical spine fracture. 2. Stable multilevel cervical spondylosis and facet hypertrophy. Electronically Signed   By: Randa Ngo M.D.   On: 01/16/2021 19:46   DG Foot 2 Views Right  Result Date: 01/17/2021 CLINICAL DATA:  80 year old male with multiple falls. EXAM: RIGHT FOOT - 2 VIEW COMPARISON:  None. FINDINGS: There is a 16 mm linear metallic retained foreign body projecting along the plantar surface of the 2nd and 3rd metatarsal heads, perhaps a needle fragment. No soft tissue gas, but severe soft tissue swelling in the foot at the level of the metatarsals. Calcified peripheral vascular disease at the ankle. Calcaneus is intact with degenerative spurring. No tarsal or metatarsal fracture. Phalanges appear intact. Intermittent joint degeneration in the foot. No cortical osteolysis. No acute osseous abnormality identified. IMPRESSION: 1. Severe soft tissue swelling. No acute osseous abnormality identified. 2. A 16 mm linear metallic retained foreign body along the plantar surface of the 2nd and 3rd metatarsal heads may be a needle fragment. Electronically Signed   By: Genevie Ann M.D.   On: 01/17/2021 05:22    09/08/2020  echocardiogram IMPRESSIONS:   1. Left ventricular ejection fraction, by estimation, is 60 to  65%. The  left ventricle has normal function. The left ventricle has no regional  wall motion abnormalities. There is mild left ventricular hypertrophy.  Left ventricular diastolic parameters  are consistent with Grade I diastolic dysfunction (impaired relaxation).   2. Right ventricular systolic function is normal. The right ventricular  size is normal.   3. Left atrial size was mildly dilated.   4. The mitral valve is normal in structure. No evidence of mitral valve  regurgitation. No evidence of mitral stenosis.   5. The aortic valve is tricuspid. Aortic valve regurgitation is not  visualized. Mild aortic valve sclerosis is present, with no evidence of  aortic valve stenosis.   6. There is borderline dilatation of the aortic root, measuring 41 mm.   7. The inferior vena cava is normal in size with greater than 50%  respiratory variability, suggesting right atrial pressure of 3 mmHg.  EKG: Independently reviewed.  Vent. rate 98 BPM PR interval * ms QRS duration 122 ms QT/QTcB 369/472 ms P-R-T axes * -44 51 Atrial fibrillation Nonspecific IVCD with LAD Inferior infarct, old Anteroseptal infarct, old  Assessment/Plan Principal Problem:   Cellulitis of right foot Observation/telemetry. Continue vancomycin per pharmacy. Continue time-limited IVF. Monitor renal function electrolytes. Begin cefepime per cellulitis order set. Analgesics as needed. Consult wound care. Follow-up blood culture and sensitivity.  Active Problems:   Acute lower UTI Received ceftriaxone earlier. Now on cefepime for cellulitis. Follow-up blood culture and sensitivity Follow-up urine culture and sensitivity.    AKI (acute kidney injury) ([place]) Superimposed on   CKD (chronic kidney disease) stage 4, GFR 15-29 ml/min (HCC) Hold torsemide and potassium supplementation. Gentle time-limited IV hydration.     Hyponatremia In the setting of chronic diuretic use. Monitor sodium level.    Hyperlipidemia Continue atorvastatin 20 mg p.o. daily.    Gastroesophageal reflux disease Continue pantoprazole 40 mg p.o. daily.    Hypertension Continue carvedilol 6.25 mg p.o. twice daily. Monitor BP and heart rate.    Paroxysmal atrial fibrillation (HCC) CHA?DS?-VASc Score of at least 5. Apixaban held due to GI bleed. Continue amiodarone 200 mg daily for rate control. Continue carvedilol twice daily.    Chronic diastolic heart failure (HCC) Clinically compensated. Diuretic held due to AKI. Continue beta-blocker.    CAD (coronary artery disease) Continue with statin and beta-blocker.    Normocytic anemia Due to chronic blood loss. Monitor H&H. Transfuse as needed.    DVT prophylaxis: Lovenox SQ. Code Status:   DNR. Family Communication:   Disposition Plan:   Patient is from:  SNF.  Anticipated DC to:  SNF.  Anticipated DC date:  01/20/2021.  Anticipated DC barriers: Clinical status.  Consults called:   Admission status:  Observation/telemetry.   Severity of Illness: High severity after presenting with urinary symptoms and cellulitis due to right foot needle puncture wound.  The patient also seems to have an acute lower UTI.  He will need to remain in the hospital for at least 2 to 3 days for IV antibiotic therapy and wound care.  Reubin Milan MD [facility]  How to contact the [facility] Attending or Consulting provider [place] or covering provider during after hours [place], for this patient?   Check the care team in [facility] and look for a) attending/consulting [facility] provider listed and b) the [facility] team listed Log into [email or web address] and use [place]'s universal password to access. If you do not have the password, please contact the hospital operator. Locate the  [place] provider you are looking for under [facility] and page to a number that you can be directly reached. If you  still have difficulty reaching the provider, please page the [facility] (Director on Call) for the Hospitalists listed on amion for assistance.  01/17/2021, 6:29 AM   This document was prepared using Dragon voice recognition software and may contain some unintended transcription errors.

## 2021-01-17 NOTE — ED Notes (Signed)
Pt straight cathed for urine due to incontinence. Pt tolerated poorly due to rash in groin area. Less than 137ml returned in catheter bag.

## 2021-01-17 NOTE — ED Notes (Signed)
As RN was getting pt ready to travel upstairs he called out "DOGS".  He states he thought he saw them but realized they were not there.  "It happens sometimes".  Pt reports he has been seeing things for about three weeks.

## 2021-01-17 NOTE — Evaluation (Signed)
Physical Therapy Evaluation Patient Details Name: Darryl Diaz MRN: [id number] DOB: October 28, 1940 Today's Date: 01/17/2021   History of Present Illness  80 y.o. male presents to [facility] hospital on 01/16/2021 with recurrent falls as well as LE, back, and head pain. Pt admitted for management of LE cellulitis with foreign body removed from R foot, as well as UTI. PMH includes PAF, chronic diastolic CHF, CKD stage IV, tachybradycardia syndrome, PAD with nonhealing left foot wounds, HLD, HTN, history of GI bleed.  Clinical Impression  Pt presents to PT with deficits in strength, power, endurance, balance, functional mobility, gait. Pt is limited by bilateral foot and LE pain and swelling, fatiguing quickly with mobility at this time. Pt is unsteady despite UE support of RW and remains at a high risk for falls after multiple recent falls at [facility]. Pt will benefit from acute PT services to improve activity tolerance and balance. PT recommends SNF placement due to high falls risk and limited caregiver support.    Follow Up Recommendations SNF    Equipment Recommendations  None recommended by PT    Recommendations for Other Services       Precautions / Restrictions Precautions Precautions: Fall Restrictions Weight Bearing Restrictions: No      Mobility  Bed Mobility Overal bed mobility: Needs Assistance Bed Mobility: Supine to Sit;Sit to Supine     Supine to sit: Supervision Sit to supine: Min guard   General bed mobility comments: difficulty elevating LEs back onto stretcher    Transfers Overall transfer level: Needs assistance Equipment used: Rolling walker (2 wheeled) Transfers: Sit to/from Stand Sit to Stand: Min guard            Ambulation/Gait Ambulation/Gait assistance: Min guard Gait Distance (Feet): 15 Feet Assistive device: Rolling walker (2 wheeled) Gait Pattern/deviations: Step-to pattern Gait velocity: reduced Gait velocity interpretation: <1.8 ft/sec, indicate  of risk for recurrent falls General Gait Details: pt with slowed step-to gait, labored gait pattern  Stairs            Wheelchair Mobility    Modified Rankin (Stroke Patients Only)       Balance Overall balance assessment: Needs assistance Sitting-balance support: Feet supported;No upper extremity supported Sitting balance-Leahy Scale: Fair     Standing balance support: Bilateral upper extremity supported Standing balance-Leahy Scale: Poor Standing balance comment: reliant on BUE support                             Pertinent Vitals/Pain Pain Assessment: 0-10 Pain Score: 7  Pain Location: BLE Pain Descriptors / Indicators: Sore Pain Intervention(s): Monitored during session    Home Living Family/patient expects to be discharged to:: Other (Comment) ([facility]) Living Arrangements: Alone Available Help at Discharge: Family;Available PRN/intermittently Type of Home: Independent living facility Home Access: Level entry;Elevator     Home Layout: One level Home Equipment: Walker - 2 wheels;Walker - 4 wheels;Wheelchair - manual;Hand held shower head;Adaptive equipment;Shower seat;Shower seat - built in      Prior Function Level of Independence: Independent with assistive device(s)         Comments: rollator for amb, 3 falls in last week     Hand Dominance   Dominant Hand: Right    Extremity/Trunk Assessment   Upper Extremity Assessment Upper Extremity Assessment: Generalized weakness    Lower Extremity Assessment Lower Extremity Assessment: Generalized weakness    Cervical / Trunk Assessment Cervical / Trunk Assessment: Kyphotic  Communication  Communication: No difficulties  Cognition Arousal/Alertness: Awake/alert Behavior During Therapy: WFL for tasks assessed/performed Overall Cognitive Status: Within Functional Limits for tasks assessed                                        General  Comments General comments (skin integrity, edema, etc.): tachypnea with mobility into high 30s    Exercises     Assessment/Plan    PT Assessment Patient needs continued PT services  PT Problem List Decreased strength;Decreased activity tolerance;Decreased balance;Decreased mobility;Cardiopulmonary status limiting activity;Pain       PT Treatment Interventions DME instruction;Gait training;Functional mobility training;Therapeutic activities;Therapeutic exercise;Balance training;Neuromuscular re-education;Patient/family education    PT Goals (Current goals can be found in the Care Plan section)  Acute Rehab PT Goals Patient Stated Goal: go to rehab and regain strength PT Goal Formulation: With patient Time For Goal Achievement: 01/31/21 Potential to Achieve Goals: Fair    Frequency Min 2X/week   Barriers to discharge Decreased caregiver support      Co-evaluation               AM-PAC PT "6 Clicks" Mobility  Outcome Measure Help needed turning from your back to your side while in a flat bed without using bedrails?: A Little Help needed moving from lying on your back to sitting on the side of a flat bed without using bedrails?: A Little Help needed moving to and from a bed to a chair (including a wheelchair)?: A Little Help needed standing up from a chair using your arms (e.g., wheelchair or bedside chair)?: A Little Help needed to walk in hospital room?: A Little Help needed climbing 3-5 steps with a railing? : A Lot 6 Click Score: 17    End of Session   Activity Tolerance: Patient limited by fatigue Patient left: in bed;with call bell/phone within reach Nurse Communication: Mobility status PT Visit Diagnosis: Unsteadiness on feet (R26.81);Other abnormalities of gait and mobility (R26.89);History of falling (Z91.81);Repeated falls (R29.6);Pain Pain - part of body: Leg;Ankle and joints of foot    Time: 1654-1720 PT Time Calculation (min) (ACUTE ONLY): 26  min   Charges:   PT Evaluation $PT Eval Low Complexity: 1 Low          Zenaida Niece, PT, DPT Acute Rehabilitation Pager: [phone]   Zenaida Niece 01/17/2021, 5:31 PM

## 2021-01-17 NOTE — ED Provider Notes (Signed)
[facility] EMERGENCY DEPARTMENT Provider Note   CSN: [id number] Arrival date & time: 01/16/21  1613     History Chief Complaint  Patient presents with   Darryl Diaz is a 80 y.o. male.  80 y.o. male with medical history significant for paroxysmal atrial fibrillation on Eliquis, chronic diastolic CHF, CKD stage IV, tachycardia-bradycardia syndrome, PAD with nonhealing left foot wounds, HTN, HLD, history of GI bleed, and mood disorder presenting from facility with recurrent falls.  Estimates has had about 3 falls in the past 1 week.  Complaining of pain to his legs and low back as well as head.  Does think that he hit his head when he fell.  He is not certain why he is falling.  States he feels suddenly "off balance" and falls to the side but denies any preceding lightheadedness or spinning sensation.  Denies any dizzy spells or passing out.  Denies any chest pain or shortness of breath.  Denies abdominal pain, nausea, vomiting, cough or fever.  Complains of increasing pain and swelling to his legs right greater than left.  Also complains of some low back pain since falling.  Has had some pain with urination as well and dark-colored urine.  His Eliquis was stopped when he was in the hospital for his GI bleed.  He reports no further black or bloody stools.  No vomiting.  States he feels off balance when he tries to walk and this is leading to his recurrent falls.  The history is provided by the patient.  Fall Associated symptoms include headaches. Pertinent negatives include no chest pain and no shortness of breath.      Past Medical History:  Diagnosis Date   Alcoholism ([place])    in remission following wife's death   Anemia    Ascending aorta dilation (HCC)    Cardiac arrest ([place]) 04/20/2019   12 min CPR with epi   Chronic diastolic CHF (congestive heart failure) (HCC)    Fatty liver    Gastritis    GERD (gastroesophageal reflux disease)    H/O seasonal  allergies    History of GI bleed    Hx of adenomatous colonic polyps 08/10/2017   Hyperlipidemia    Hypertension    Insomnia    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   Mallory-Weiss tear    OA (osteoarthritis)    hands   OSA (obstructive sleep apnea)    No longer uses CPAP   Persistent atrial fibrillation with rapid ventricular response ([place]) 02/05/2020   Recurrent syncope    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[facility])     Patient Active Problem List   Diagnosis Date Noted   Acute GI bleeding 01/06/2021   Symptomatic anemia 12/02/2020   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   Orthostatic hypotension 11/23/2020   Acute renal insufficiency 10/23/2020   Alcohol abuse 10/23/2020   Hypokalemia 10/23/2020   Hypomagnesemia 10/23/2020   Low back pain at multiple sites 10/23/2020   Malnutrition of mild degree Altamease Oiler: 75% to less than 90% of standard weight) ([place]) 10/23/2020   Open wound of scalp without complication 67/61/9509   Oral candidiasis 10/23/2020   Cellulitis of left foot    Cellulitis of toe of left foot 10/13/2020   Skin ulcer of left great toe ([place]) 10/13/2020   CKD (chronic kidney disease) stage 4, 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/13/2020   Chronic heart failure with preserved ejection fraction (HFpEF) ([place]) 10/13/2020  Transient weakness of left lower extremity 09/07/2020   Heme positive stool 05/14/2020   Abnormality of gait 04/27/2020   Salmonella food poisoning 67/04/4579   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   Yeast infection 03/16/2020   Diarrhea 03/16/2020   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 (HCC)    Allergies    Anemia, chronic disease    History of hypertension    Debility    Syncope and collapse 02/22/2020   AKI (acute kidney injury) ([place]) 02/10/2020   Unspecified protein-calorie malnutrition ([place]) 02/10/2020   Weakness 02/06/2020   History of repair of hip joint  10/15/2019   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   Pain in right foot 06/27/2019   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   Bradycardia 04/20/2019   Chronic diastolic heart failure ([place]) 03/14/2019   Atrial fibrillation with rapid ventricular response ([place])    Gastrointestinal hemorrhage 02/25/2019   Fall at home, initial encounter 02/25/2019   Bilateral lower extremity edema 02/25/2019   Hyponatremia 02/25/2019   Fall 02/25/2019   Benign prostatic hyperplasia with lower urinary tract symptoms 12/19/2018   Iron deficiency anemia due to chronic blood loss 08/11/2018   Anemia 08/11/2018   Hardening of the aorta (main artery of the heart) ([place]) 08/11/2018   Benign neoplasm of colon 06/18/2018   History of adenomatous polyp of colon 08/10/2017   PAF (paroxysmal atrial fibrillation) ([place]) 02/05/2017   Impaired fasting glucose 06/10/2016   Encounter for general adult medical examination without abnormal findings 06/03/2016   Posterior dislocation of hip, closed ([place]) 04/30/2016   Hip fracture ([place]) 03/30/2016   Hypertension 03/30/2016   Hyperlipidemia 03/30/2016   Osteoarthritis 03/30/2016   Depressive disorder 03/30/2016   Rhabdomyolysis 03/30/2016   Leukocytosis 03/30/2016   Pressure ulcer 03/30/2016   Fracture of hip ([place]) 03/30/2016   Gastroesophageal reflux disease 12/17/2015   Paroxysmal atrial fibrillation ([place]) 12/17/2015   Hypertensive disorder 12/17/2015   Long term (current) use of anticoagulants 12/17/2015   Moderate major depression, single episode ([place]) 12/17/2015   Pure hypercholesterolemia 12/17/2015   Seasonal allergic rhinitis 12/17/2015   CAD (coronary artery disease) 08/21/2015    Past Surgical History:  Procedure Laterality Date   ANKLE SURGERY Left 2014   had rods put in    BIOPSY  02/28/2019   Procedure: BIOPSY;  Surgeon: Milus Banister, MD;  Location: [place];  Service: Endoscopy;;   CARDIOVERSION  02/06/2020   CARDIOVERSION N/A 02/06/2020    Procedure: CARDIOVERSION;  Surgeon: Werner Lean, MD;  Location: [facility] ENDOSCOPY;  Service: Cardiovascular;  Laterality: N/A;   CARDIOVERSION N/A 02/24/2020   Procedure: CARDIOVERSION;  Surgeon: Werner Lean, MD;  Location: [place];  Service: Cardiovascular;  Laterality: N/A;   COLONOSCOPY     [place]   COLONOSCOPY WITH PROPOFOL N/A 01/08/2021   Procedure: COLONOSCOPY WITH PROPOFOL;  Surgeon: Milus Banister, MD;  Location[facility] ENDOSCOPY;  Service: Endoscopy;  Laterality: N/A;   ESOPHAGOGASTRODUODENOSCOPY (EGD) WITH PROPOFOL N/A 02/28/2019   Procedure: ESOPHAGOGASTRODUODENOSCOPY (EGD) WITH PROPOFOL;  Surgeon: Milus Banister, MD;  [facility]COPY;  Service: Endoscopy;  Laterality: N/A;   HIP ARTHROPLASTY Left 04/01/2016   Procedure: ARTHROPLASTY BIPOLAR HIP (HEMIARTHROPLASTY);  Surgeon: Paralee Cancel, MD;  Location: [place];  Service: Orthopedics;  Laterality: Left;   HIP CLOSED REDUCTION Left 04/30/2016   Procedure: CLOSED REDUCTION HIP;  Surgeon: Wylene Simmer, MD;  Location: [place];  Service: Orthopedics;  Laterality: Left;   HOT HEMOSTASIS N/A 01/08/2021  Procedure: HOT HEMOSTASIS (ARGON PLASMA COAGULATION/BICAP);  Surgeon: Milus Banister, MD;  Location: [facility] ENDOSCOPY;  Service: Endoscopy;  Laterality: N/A;   IR RADIOLOGIST EVAL & MGMT  12/31/2020   TONSILLECTOMY     TOTAL HIP ARTHROPLASTY Right 10/15/2019   Procedure: TOTAL HIP ARTHROPLASTY ANTERIOR APPROACH;  Surgeon: Paralee Cancel, MD;  Location: [facility]S;  Service: Orthopedics;  Laterality: Right;  70 mins       Family History  Problem Relation Age of Onset   Heart disease Mother 31   Hypertension Mother    Arthritis Mother    Heart failure Father 16   Stroke Maternal Aunt    Heart failure Sister    Colon cancer Neg Hx    Esophageal cancer Neg Hx    Stomach cancer Neg Hx    Rectal cancer Neg Hx     Social History   Tobacco Use   Smoking status: Former    Packs/day: 1.00    Years: 20.00    Pack  years: 20.00    Types: Cigarettes   Smokeless tobacco: Never   Tobacco comments:       quit 20 years  smoked on and off for 20 years  Vaping Use   Vaping Use: Never used  Substance Use Topics   Alcohol use: Not Currently    Comment: h/o heavy use   Drug use: Never    Home Medications Prior to Admission medications   Medication Sig Start Date End Date Taking? Authorizing Provider  acetaminophen (TYLENOL) 325 MG tablet Take 2 tablets (650 mg total) by mouth every 4 (four) hours as needed for headache or mild pain. 03/10/20   Angiulli, Lavon Paganini, PA-C  amiodarone (PACERONE) 200 MG tablet Take 1 tablet (200 mg total) by mouth daily. Patient taking differently: Take 200 mg by mouth daily with lunch. 04/10/20   Dunn, Nedra Hai, PA-C  atorvastatin (LIPITOR) 20 MG tablet Take 1 tablet (20 mg total) by mouth daily. Patient taking differently: Take 20 mg by mouth daily with lunch. 03/10/20   Angiulli, Lavon Paganini, PA-C  carvedilol (COREG) 6.25 MG tablet Take 1 tablet (6.25 mg total) by mouth 2 (two) times daily with a meal. 01/09/21   Thurnell Lose, MD  escitalopram (LEXAPRO) 10 MG tablet Take 1 tablet (10 mg total) by mouth daily. Patient taking differently: Take 10 mg by mouth daily with lunch. 03/10/20   Angiulli, Lavon Paganini, PA-C  ferrous sulfate 325 (65 FE) MG tablet Take 1 tablet (325 mg total) by mouth daily. Patient taking differently: Take 325 mg by mouth daily with lunch. 12/03/20 01/06/21  Aline August, MD  folic acid (FOLVITE) 1 MG tablet Take 1 tablet (1 mg total) by mouth daily. Patient taking differently: Take 1 mg by mouth daily with lunch. 03/10/20   Angiulli, Lavon Paganini, PA-C  hydrALAZINE (APRESOLINE) 50 MG tablet Take 1 tablet (50 mg total) by mouth 3 (three) times daily. 01/09/21   Thurnell Lose, MD  ketoconazole (NIZORAL) 2 % cream Apply 1 application topically daily. To dry scaly skin on feet Patient taking differently: Apply 1 application topically 2 (two) times daily as needed (dry  scaly skin on feet). 07/23/20   McDonald, Stephan Minister, DPM  loratadine (CLARITIN) 10 MG tablet Take 1 tablet (10 mg total) by mouth daily. Patient taking differently: Take 10 mg by mouth daily as needed for allergies. 02/12/20   Medina-Vargas, Monina C, NP  Magnesium Oxide 400 MG CAPS Take 1 capsule (400 mg total)  by mouth daily. Patient taking differently: Take 400 mg by mouth daily with lunch. 10/17/20   Eugenie Filler, MD  methocarbamol (ROBAXIN) 500 MG tablet Take 1 tablet (500 mg total) by mouth every 6 (six) hours as needed for muscle spasms. 12/03/20   Aline August, MD  Multiple Vitamins-Minerals (CENTRUM SILVER 50+MEN) TABS Take 1 tablet by mouth daily with breakfast.    [provider]  nitroGLYCERIN (NITROSTAT) 0.4 MG SL tablet Place 1 tablet (0.4 mg total) under the tongue every 5 (five) minutes x 3 doses as needed for chest pain. 07/28/20   Baldwin Jamaica, PA-C  pantoprazole (PROTONIX) 40 MG tablet Take 1 tablet (40 mg total) by mouth daily. Patient taking differently: Take 40 mg by mouth daily with lunch. 03/10/20   Angiulli, Lavon Paganini, PA-C  potassium chloride SA (KLOR-CON) 20 MEQ tablet Take 1 tablet (20 mEq total) by mouth daily. Patient taking differently: Take 20 mEq by mouth daily with lunch. 10/17/20 10/12/21  Eugenie Filler, MD  torsemide (DEMADEX) 20 MG tablet Take 60 mg by mouth See admin instructions. 60 mg every morning, may take 60 mg in the afternoon as needed for swelling    [provider]  traZODone (DESYREL) 50 MG tablet Take 1 tablet (50 mg total) by mouth at bedtime. 03/10/20   Angiulli, Lavon Paganini, PA-C    Allergies    Penicillin g sodium, Penicillins, and Penicillins  Review of Systems   Review of Systems  Constitutional:  Positive for fatigue. Negative for activity change, appetite change and fever.  HENT:  Negative for congestion.   Respiratory:  Negative for cough, chest tightness and shortness of breath.   Cardiovascular:  Positive for leg  swelling. Negative for chest pain.  Gastrointestinal:  Negative for nausea and vomiting.  Genitourinary:  Positive for dysuria, frequency and urgency.  Musculoskeletal:  Positive for arthralgias, back pain and myalgias.  Skin:  Negative for rash.  Neurological:  Positive for weakness, light-headedness and headaches. Negative for dizziness.   all other systems are negative except as noted in the HPI and PMH.   Physical Exam Updated Vital Signs BP (!) 148/52   Pulse 84   Temp 98.2 F (36.8 C) (Oral)   Resp 16   SpO2 99%   Physical Exam Vitals and nursing note reviewed.  Constitutional:      General: He is not in acute distress.    Appearance: He is well-developed. He is ill-appearing.     Comments: Chronically ill-appearing Oriented to person, place and time.  HENT:     Head: Normocephalic and atraumatic.     Mouth/Throat:     Pharynx: No oropharyngeal exudate.  Eyes:     Conjunctiva/sclera: Conjunctivae normal.     Pupils: Pupils are equal, round, and reactive to light.  Neck:     Comments: No meningismus. Cardiovascular:     Rate and Rhythm: Normal rate and regular rhythm.     Heart sounds: Normal heart sounds. No murmur heard. Pulmonary:     Effort: Pulmonary effort is normal. No respiratory distress.     Breath sounds: Normal breath sounds.  Chest:     Chest wall: No tenderness.  Abdominal:     Palpations: Abdomen is soft.     Tenderness: There is no abdominal tenderness. There is no guarding or rebound.  Musculoskeletal:        General: Swelling and tenderness present.     Cervical back: Normal range of motion and neck supple.  Right lower leg: Edema present.     Left lower leg: Edema present.     Comments: +2 pitting edema bilaterally, right greater than left.  Diffuse erythema of the right foot and lower leg with some blistering to the dorsal foot. Intact DP and PT pulses bilaterally  No T or L spine tenderness  Skin:    General: Skin is warm.   Neurological:     Mental Status: He is alert and oriented to person, place, and time.     Cranial Nerves: No cranial nerve deficit.     Motor: No abnormal muscle tone.     Coordination: Coordination normal.     Comments: CN 2-12 intact, tremulous.  Equal grip strengths. 5/5 strength throughout.  Able to lift legs off bed bilaterally  Psychiatric:        Behavior: Behavior normal.       ED Results / Procedures / Treatments   Labs (all labs ordered are listed, but only abnormal results are displayed) Labs Reviewed  CBC WITH DIFFERENTIAL/PLATELET - Abnormal; Notable for the following components:      Result Value   WBC 15.7 (*)    RBC 2.69 (*)    Hemoglobin 8.6 (*)    HCT 25.4 (*)    Neutro Abs 14.2 (*)    Lymphs Abs 0.3 (*)    Abs Immature Granulocytes 0.13 (*)    All other components within normal limits  COMPREHENSIVE METABOLIC PANEL - Abnormal; Notable for the following components:   Sodium 129 (*)    Chloride 94 (*)    CO2 20 (*)    Glucose, Bld 112 (*)    BUN 53 (*)    Creatinine, Ser 4.29 (*)    Total Protein 6.2 (*)    Albumin 3.0 (*)    GFR, Estimated 13 (*)    All other components within normal limits  URINALYSIS, ROUTINE W REFLEX MICROSCOPIC - Abnormal; Notable for the following components:   Color, Urine AMBER (*)    APPearance CLOUDY (*)    Hgb urine dipstick LARGE (*)    Bilirubin Urine MODERATE (*)    Protein, ur >300 (*)    Nitrite POSITIVE (*)    Leukocytes,Ua LARGE (*)    All other components within normal limits  BRAIN NATRIURETIC PEPTIDE - Abnormal; Notable for the following components:   B Natriuretic Peptide 351.8 (*)    All other components within normal limits  URINALYSIS, MICROSCOPIC (REFLEX) - Abnormal; Notable for the following components:   Bacteria, UA FEW (*)    All other components within normal limits  TROPONIN I (HIGH SENSITIVITY) - Abnormal; Notable for the following components:   Troponin I (High Sensitivity) 31 (*)    All  other components within normal limits  RESP PANEL BY RT-PCR (FLU A&B, COVID) ARPGX2  URINE CULTURE  CULTURE, BLOOD (ROUTINE X 2)  CULTURE, BLOOD (ROUTINE X 2)  PROTIME-INR  LACTIC ACID, PLASMA  TROPONIN I (HIGH SENSITIVITY)    EKG EKG Interpretation  Date/Time:  Sunday January 17 2021 05:06:21 EDT Ventricular Rate:  98 PR Interval:    QRS Duration: 122 QT Interval:  369 QTC Calculation: 472 R Axis:   -44 Text Interpretation: Atrial fibrillation Nonspecific IVCD with LAD Inferior infarct, old Anteroseptal infarct, old now atrial fibrillation Confirmed by Ezequiel Essex [phone]) on 01/17/2021 5:11:59 AM  Radiology DG Chest 1 View  Result Date: 01/17/2021 CLINICAL DATA:  80 year old male with shortness of breath. Multiple falls. EXAM: CHEST  1  VIEW COMPARISON:  Portable chest 09/08/2020 and earlier. FINDINGS: Portable AP view at 0426 hours. Low lung volumes similar to those in May. Continued patchy and veiling bibasilar lung opacity, new from last year. Associated increased pulmonary vascularity. No pneumothorax. No air bronchograms. Stable cardiac size and mediastinal contours. Visualized tracheal air column is within normal limits. Multilevel right lateral rib fractures again noted. Paucity of bowel gas in the upper abdomen. IMPRESSION: 1. Lower lung volumes with patchy and veiling bibasilar opacity, diffuse pulmonary vascular congestion. Suspect interstitial edema with bibasilar atelectasis and possible small pleural effusions. These findings are new from last year and unchanged compared to May. 2. Chronic right rib fractures. Electronically Signed   By: Genevie Ann M.D.   On: 01/17/2021 05:27   DG Lumbar Spine Complete  Result Date: 01/17/2021 CLINICAL DATA:  80 year old male with shortness of breath. Multiple falls. EXAM: LUMBAR SPINE - COMPLETE 4+ VIEW COMPARISON:  Lumbar radiographs 11/25/2020. FINDINGS: Normal lumbar segmentation. Stable lumbar lordosis. Stable vertebral height and  alignment. Diffuse severe disc space loss and bulky endplate spurring in the lower thoracic and lumbar spine. Widespread lumbar facet hypertrophy. No pars fracture. Visible sacrum and SI joints appear intact. No acute osseous abnormality identified. Bilateral hip arthroplasty. Abdominal Calcified aortic atherosclerosis. Visible bowel-gas pattern is within normal limits. IMPRESSION: 1. Diffuse severe spine degeneration. No acute osseous abnormality identified in the lumbar spine. 2.  Aortic Atherosclerosis (ICD10-I70.0). Electronically Signed   By: Genevie Ann M.D.   On: 01/17/2021 05:24   CT HEAD WO CONTRAST (5MM)  Result Date: 01/16/2021 CLINICAL DATA:  Head trauma. EXAM: CT HEAD WITHOUT CONTRAST TECHNIQUE: Contiguous axial images were obtained from the base of the skull through the vertex without intravenous contrast. COMPARISON:  Sep 07, 2020 FINDINGS: Brain: No evidence of acute infarction, hemorrhage, hydrocephalus, extra-axial collection or mass lesion/mass effect. Severe brain parenchymal volume loss and deep white matter microangiopathy. Vascular: No hyperdense vessel or unexpected calcification. Skull: Normal. Negative for fracture or focal lesion. Sinuses/Orbits: No acute finding. Other: None. IMPRESSION: 1. No acute intracranial abnormality. 2. Severe brain parenchymal atrophy and chronic microvascular disease. Electronically Signed   By: Fidela Salisbury M.D.   On: 01/16/2021 17:31   CT Cervical Spine Wo Contrast  Result Date: 01/16/2021 CLINICAL DATA:  [place], neck trauma EXAM: CT CERVICAL SPINE WITHOUT CONTRAST TECHNIQUE: Multidetector CT imaging of the cervical spine was performed without intravenous contrast. Multiplanar CT image reconstructions were also generated. COMPARISON:  07/08/2020 FINDINGS: Alignment: Alignment is grossly anatomic and stable. Skull base and vertebrae: No acute fracture. No primary bone lesion or focal pathologic process. Soft tissues and spinal canal: No prevertebral  fluid or swelling. No visible canal hematoma. Disc levels: Stable multilevel spondylosis and facet hypertrophy. Disc space narrowing and osteophyte formation are greatest at the C5-6, C6-7, and C7-T1 levels. There is marked facet hypertrophy from C2 through C5. Upper chest: Central airway is patent.  Lung apices are clear. Other: Reconstructed images demonstrate no additional findings. IMPRESSION: 1. No acute cervical spine fracture. 2. Stable multilevel cervical spondylosis and facet hypertrophy. Electronically Signed   By: Randa Ngo M.D.   On: 01/16/2021 19:46   DG Foot 2 Views Right  Result Date: 01/17/2021 CLINICAL DATA:  Removal of foreign body. EXAM: RIGHT FOOT - 2 VIEW COMPARISON:  Earlier today at 4:29 a.m. FINDINGS: 6:23 a.m. Degenerative changes about the first metatarsophalangeal joint. Interval removal of the radiopaque foreign body/needle. Diffuse soft tissue swelling again identified. Vascular calcifications. IMPRESSION: Removal of radiopaque  foreign body. Soft tissue swelling. Electronically Signed   By: Abigail Miyamoto M.D.   On: 01/17/2021 06:52   DG Foot 2 Views Right  Result Date: 01/17/2021 CLINICAL DATA:  80 year old male with multiple falls. EXAM: RIGHT FOOT - 2 VIEW COMPARISON:  None. FINDINGS: There is a 16 mm linear metallic retained foreign body projecting along the plantar surface of the 2nd and 3rd metatarsal heads, perhaps a needle fragment. No soft tissue gas, but severe soft tissue swelling in the foot at the level of the metatarsals. Calcified peripheral vascular disease at the ankle. Calcaneus is intact with degenerative spurring. No tarsal or metatarsal fracture. Phalanges appear intact. Intermittent joint degeneration in the foot. No cortical osteolysis. No acute osseous abnormality identified. IMPRESSION: 1. Severe soft tissue swelling. No acute osseous abnormality identified. 2. A 16 mm linear metallic retained foreign body along the plantar surface of the 2nd and 3rd  metatarsal heads may be a needle fragment. Electronically Signed   By: Genevie Ann M.D.   On: 01/17/2021 05:22    Procedures .Foreign Body Removal  Date/Time: 01/17/2021 5:46 AM Performed by: Ezequiel Essex, MD Authorized by: Ezequiel Essex, MD  Consent: Verbal consent obtained. Risks and benefits: risks, benefits and alternatives were discussed Consent given by: patient Patient understanding: patient states understanding of the procedure being performed Patient consent: the patient's understanding of the procedure matches consent given Procedure consent: procedure consent matches procedure scheduled Relevant documents: relevant documents present and verified Test results: test results available and properly labeled Site marked: the operative site was marked Imaging studies: imaging studies available Patient identity confirmed: verbally with patient Time out: Immediately prior to procedure a "time out" was called to verify the correct patient, procedure, equipment, support staff and site/side marked as required. Body area: skin General location: lower extremity Location details: right foot  Sedation: Patient sedated: no  Patient restrained: no Patient cooperative: yes Localization method: serial x-rays and visualized Removal mechanism: forceps Tendon involvement: none Depth: subcutaneous Complexity: simple 1 objects recovered. Objects recovered: needle fragment with thread Post-procedure assessment: foreign body removed Patient tolerance: patient tolerated the procedure well with no immediate complications  .Critical Care Performed by: Ezequiel Essex, MD Authorized by: Ezequiel Essex, MD   Critical care provider statement:    Critical care time (minutes):  45   Critical care was necessary to treat or prevent imminent or life-threatening deterioration of the following conditions:  Sepsis, renal failure and trauma   Critical care was time spent personally by me on the  following activities:  Discussions with consultants, evaluation of patient's response to treatment, examination of patient, ordering and performing treatments and interventions, ordering and review of laboratory studies, ordering and review of radiographic studies, pulse oximetry, re-evaluation of patient's condition, obtaining history from patient or surrogate and review of old charts   Medications Ordered in ED Medications - No data to display  ED Course  I have reviewed the triage vital signs and the nursing notes.  Pertinent labs & imaging results that were available during my care of the patient were reviewed by me and considered in my medical decision making (see chart for details).    MDM Rules/Calculators/A&P                          Patient from facility with recurrent falls.  Complaining of low back pain and leg pain.  Vitals are stable.  Appears to have cellulitis of right lower extremity on exam.  Neurologically intact.  CT head and C-spine in triage are negative.  Hemoglobin stable at 8.6.  Creatinine worsening 4.2 from 2.6.  Exam concerning for right lower extremity cellulitis as well as UTI with AKI.  Patient given IV fluids and IV antibiotics after cultures are obtained.  X-ray shows needle fragment in the soft tissues of right foot.  This is visualized on exam with thread attached to it.  This was removed with purulent drainage from the site.  X-ray confirms removal of needle foreign body.  Will obtain CT scan to evaluate for deep space infection or abscess.  Vitals remained stable..  Continue IV fluids for AKI, IV antibiotics for cellulitis as well as UTI. CT scan of foot pending.  Admission discussed with Dr. Olevia Bowens. Final Clinical Impression(s) / ED Diagnoses Final diagnoses:  Fall, initial encounter  Cellulitis of right lower leg  AKI (acute kidney injury) ([place])  Acute cystitis without hematuria    Rx / DC Orders ED Discharge Orders     None         Pearce Littlefield, Annie Main, MD 01/17/21 0710

## 2021-01-17 NOTE — ED Notes (Signed)
Two bags of belongings to floor w/ pt, one bag contains a cell phone.

## 2021-01-17 NOTE — Progress Notes (Addendum)
Pharmacy Antibiotic Note  Darryl Diaz is a 80 y.o. male admitted on 01/16/2021 with  multiple falls over the past week .  Pharmacy has been consulted for vancomycin and cefepime dosing for noted lower extremity cellulitis.   Patient currently afebrile, wbc elevated at 15.7. AKI noted, scr up to 4.29, was <3 last week. Will give loading dose of vancomycin now and follow up need to redose based on renal function.   Plan: Vancomycin 2g now then 1500mg  IV every 48 hours.  Goal trough 10-15 mcg/mL. Consider random level prior to redosing if scr is still trending up tomorrow Cefepime 2g q24 hours  Height: 6' (182.9 cm) Weight: 105 kg (231 lb 7.7 oz) IBW/kg (Calculated) : 77.6  Temp (24hrs), Avg:98.2 F (36.8 C), Min:98.2 F (36.8 C), Max:98.2 F (36.8 C)  Recent Labs  Lab 01/16/21 1633  WBC 15.7*  CREATININE 4.29*    Estimated Creatinine Clearance: 17.5 mL/min (A) (by C-G formula based on SCr of 4.29 mg/dL (H)).    Allergies  Allergen Reactions   Penicillin G Sodium     Other reaction(s): rash   Penicillins Other (See Comments)    Tolerated Cephalosporin Date: 10/15/19. Allergy is from childhood Did it involve swelling of the face/tongue/throat, SOB, or low BP? Unknown Did it involve sudden or severe rash/hives, skin peeling, or any reaction on the inside of your mouth or nose? Unknown Did you need to seek medical attention at a hospital or doctor's office? Unknown When did it last happen?    childhood reaction   If all above answers are "NO", may proceed with cephalosporin use.   Penicillins     Thank you for allowing pharmacy to be a part of this patient's care.  Erin Hearing PharmD., BCPS Clinical Pharmacist 01/17/2021 5:28 AM

## 2021-01-17 NOTE — Progress Notes (Signed)
  80yom PAF, chronic diastolic CHF, CKD stage IV, tachybradycardia syndrome, PAD with nonhealing left foot wounds, HLD, HTN, history of GI bleed, 1 disorder presented from the facility with recurrent falls about 3 in the past 1 week and was complaining of leg and low back as well as head pain.  Complaining of increasing pain and swelling to his right leg greater than left.  Eliquis.  When he was in the hospital for GI bleed previously.  In the ED,Vitals fairly stable, received IV fluid bolus, vancomycin and ceftriaxone, He had foreign body on the right foot that was removed. Imaging:chest x-ray lower lung volumes with patchy and barely bibasilar opacity diffuse pulmonary vascular congestion, suspected interstitial edema with possible small pleural effusion new from last year but unchanged compared to May, chronic right rib fractures. X-ray lumbar spine diffuse severe spine degeneration, CT head no acute intracranial findings severe brain parenchymal atrophy with chronic microvascular disease. CT cervical spine multilevel spondylosis and facet atrophy. X-ray foot right soft tissue swelling. Labs showed abnormal UA with pyuria, leukocytosis 15.7 BNP 315, troponin 31, CMP hyponatremia 129 potassium 3.5 BUN/creatinine 53/4.2 recent baseline 2.8-3.4, blood cultures were sent.  He was admitted for further management: Seen and examined This morning.  Lower extremity grossly swollen not on oxygen denies chest pain shortness of breath. Endorses multiple falls at home, edematous lower leg and pain. Lungs are clear to auscultation tolerating IV fluids  issues  Right foot more than left cellulitis with foreign body: Foreign body removed, on IV antibiotics, ct foot pending. Recent Labs  Lab 01/16/21 1633 01/17/21 0601  WBC 15.7*  --   LATICACIDVEN  --  1.0     Acute lower UTI-on antibiotics  AKI on CKD stage IV:BUN/creatinine 53/4.2 recent baseline 2.8-3.4, home torsemide held, placed on gentle IV  fluids  Hyponatremia:on ivf, holding diuretics.  Chronic issues:  HLD Chronic diastolic CHF bnp 786 HTN GERD PAF on amiodarone and Coreg Eliquis discontinued recently for GI bleed CAD on statin and beta-blocker Positive troponin 31> 69> 31, transiently high without chest pain.  EKG shows A. 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

## 2021-01-17 NOTE — TOC Initial Note (Addendum)
Transition of Care [facility]) - Initial/Assessment Note    Patient Details  Name: Darryl Diaz MRN: [id number] Date of Birth: [birth year]  Transition of [facility]) CM/SW Contact:    Verdell Carmine, RN Phone Number: 01/17/2021, 3:12 PM  Clinical Narrative:                   Received a call fro Barb Merino at [facility]. This patient lives in [place] [facility], and His sister who lives in town is not able to stay with him 24/7. His other family member is out of town.  He could potentially have a skilled bed at [facility] on Monday.  Barb Merino - [phone] PT evaluation ordered for recommendations, CSW to follow for [facility]  Expected Discharge Plan: [place] Barriers to Discharge: Continued Medical Work up   Patient Goals and CMS Choice        Expected Discharge Plan and Services Expected Discharge Plan: [place]       Living arrangements for the past 2 months: Apartment, [place]                                      Prior Living Arrangements/Services Living arrangements for the past 2 months: Apartment, [place] Lives with:: Self Patient language and need for interpreter reviewed:: Yes Do you feel safe going back to the place where you live?: Yes      Need for Family Participation in Patient Care: Yes (Comment) Care giver support system in place?: Yes (comment)   Criminal Activity/Legal Involvement Pertinent to Current Situation/Hospitalization: No - Comment as needed  Activities of Daily Living      Permission Sought/Granted                  Emotional Assessment         Alcohol / Substance Use: Not Applicable Psych Involvement: No (comment)  Admission diagnosis:  Cellulitis of right foot [L03.115] Patient Active Problem List   Diagnosis Date Noted   Cellulitis of right foot 01/17/2021   Normocytic anemia 01/17/2021   Acute lower UTI 01/17/2021   Acute GI  bleeding 01/06/2021   Symptomatic anemia 12/02/2020   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   Orthostatic hypotension 11/23/2020   Acute renal insufficiency 10/23/2020   Alcohol abuse 10/23/2020   Hypokalemia 10/23/2020   Hypomagnesemia 10/23/2020   Low back pain at multiple sites 10/23/2020   Malnutrition of mild degree Altamease [place]: 75% to less than 90% of standard weight) ([place]) 10/23/2020   Open wound of scalp without complication 70/62/3762   Oral candidiasis 10/23/2020   Cellulitis of left foot    Cellulitis of toe of left foot 10/13/2020   Skin ulcer of left great toe ([place]) 10/13/2020   CKD (chronic kidney disease) stage 4, GFR 15-29 ml/min ([place]) 10/13/2020   Chronic heart failure with preserved ejection fraction (HFpEF) ([place]) 10/13/2020   Transient weakness of left lower extremity 09/07/2020   Heme positive stool 05/14/2020   Abnormality of gait 04/27/2020   Salmonella food poisoning 83/15/1761   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/2020   Yeast infection 03/16/2020   Diarrhea 03/16/2020   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 ([place])    Allergies    Anemia, chronic disease    History of hypertension    Debility    Syncope and collapse 02/22/2020   AKI (  acute kidney injury) ([place]) 02/10/2020   Unspecified protein-calorie malnutrition ([place]) 02/10/2020   Weakness 02/06/2020   History of repair of hip joint 10/15/2019   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   Pain in right foot 06/27/2019   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   Bradycardia 04/20/2019   Chronic diastolic heart failure ([place]) 03/14/2019   Atrial fibrillation with rapid ventricular response ([place])    Gastrointestinal hemorrhage 02/25/2019   Fall at home, initial encounter 02/25/2019   Bilateral lower extremity edema 02/25/2019   Hyponatremia 02/25/2019   Fall 02/25/2019   Benign prostatic hyperplasia with lower urinary tract symptoms  12/19/2018   Iron deficiency anemia due to chronic blood loss 08/11/2018   Anemia 08/11/2018   Hardening of the aorta (main artery of the heart) ([place]) 08/11/2018   Benign neoplasm of colon 06/18/2018   History of adenomatous polyp of colon 08/10/2017   PAF (paroxysmal atrial fibrillation) ([place]) 02/05/2017   Impaired fasting glucose 06/10/2016   Encounter for general adult medical examination without abnormal findings 06/03/2016   Posterior dislocation of hip, closed ([place]) 04/30/2016   Hip fracture ([place]) 03/30/2016   Hypertension 03/30/2016   Hyperlipidemia 03/30/2016   Osteoarthritis 03/30/2016   Depressive disorder 03/30/2016   Rhabdomyolysis 03/30/2016   Leukocytosis 03/30/2016   Pressure ulcer 03/30/2016   Fracture of hip ([place]) 03/30/2016   Gastroesophageal reflux disease 12/17/2015   Paroxysmal atrial fibrillation ([place]) 12/17/2015   Hypertensive disorder 12/17/2015   Long term (current) use of anticoagulants 12/17/2015   Moderate major depression, single episode ([place]) 12/17/2015   Pure hypercholesterolemia 12/17/2015   Seasonal allergic rhinitis 12/17/2015   CAD (coronary artery disease) 08/21/2015   PCP:  [name], MD Pharmacy:   [facility], [address] [place] [address] Phone: [phone] Fax: [phone]     Social Determinants of Health (SDOH) Interventions    Readmission Risk Interventions Readmission Risk Prevention Plan 03/18/2020 03/05/2020 04/23/2019  Transportation Screening Complete Complete Complete  PCP or Specialist Appt within 5-7 Days - - Complete  Home Care Screening - - Complete  Medication Review (RN CM) - - Complete  HRI or Home Care Consult Complete Complete -  Social Work Consult for [place] Planning/Counseling Complete Complete -  Palliative Care Screening Not Applicable Not Applicable -  Medication Review (RN Care Manager) Complete Complete -  Some recent data  might be hidden

## 2021-01-17 NOTE — ED Notes (Signed)
Bladder scan showed 54ml of urine

## 2021-01-18 LAB — CBC
HCT: 22.1 % — ABNORMAL LOW (ref 39.0–52.0)
Hemoglobin: 7.6 g/dL — ABNORMAL LOW (ref 13.0–17.0)
MCH: 31.5 pg (ref 26.0–34.0)
MCHC: 34.4 g/dL (ref 30.0–36.0)
MCV: 91.7 fL (ref 80.0–100.0)
Platelets: 188 10*3/uL (ref 150–400)
RBC: 2.41 MIL/uL — ABNORMAL LOW (ref 4.22–5.81)
RDW: 13.1 % (ref 11.5–15.5)
WBC: 11.4 10*3/uL — ABNORMAL HIGH (ref 4.0–10.5)
nRBC: 0 % (ref 0.0–0.2)

## 2021-01-18 LAB — RENAL FUNCTION PANEL
Albumin: 2.2 g/dL — ABNORMAL LOW (ref 3.5–5.0)
Anion gap: 11 (ref 5–15)
BUN: 57 mg/dL — ABNORMAL HIGH (ref 8–23)
CO2: 19 mmol/L — ABNORMAL LOW (ref 22–32)
Calcium: 8.2 mg/dL — ABNORMAL LOW (ref 8.9–10.3)
Chloride: 99 mmol/L (ref 98–111)
Creatinine, Ser: 4.17 mg/dL — ABNORMAL HIGH (ref 0.61–1.24)
GFR, Estimated: 14 mL/min — ABNORMAL LOW (ref >60)
Glucose, Bld: 113 mg/dL — ABNORMAL HIGH (ref 70–99)
Phosphorus: 3.1 mg/dL (ref 2.5–4.6)
Potassium: 3.5 mmol/L (ref 3.5–5.1)
Sodium: 129 mmol/L — ABNORMAL LOW (ref 135–145)

## 2021-01-18 NOTE — Progress Notes (Signed)
PROGRESS NOTE    Darryl Diaz  EVO:[id number] DOB: 1940-11-16 DOA: 01/16/2021 PCP: [name], MD   Chief Complaint  Patient presents with   Fall    Brief Narrative: 6yom PAF, chronic diastolic CHF, CKD stage IV, tachybradycardia syndrome, PAD with nonhealing left foot wounds, HLD, HTN, history of GI bleed, 1 disorder presented from the facility with recurrent falls about 3 in the past 1 week and was complaining of leg and low back as well as head pain.  Complaining of increasing pain and swelling to his right leg greater than left.  Eliquis when he was in the hospital for GI bleed previously 8/31-9/3   In the ED,Vitals fairly stable, received IV fluid bolus, vancomycin and ceftriaxone, He had foreign body on the right foot that was removed. Imaging:chest x-ray lower lung volumes with patchy and barely bibasilar opacity diffuse pulmonary vascular congestion, suspected interstitial edema with possible small pleural effusion new from last year but unchanged compared to May, chronic right rib fractures. X-ray lumbar spine diffuse severe spine degeneration, CT head no acute intracranial findings severe brain parenchymal atrophy with chronic microvascular disease. CT cervical spine multilevel spondylosis and facet atrophy. X-ray foot right soft tissue swelling. Labs showed abnormal UA with pyuria, leukocytosis 15.7 BNP 315, troponin 31, CMP hyponatremia 129 potassium 3.5 BUN/creatinine 53/4.2 recent baseline 2.8-3.4, blood cultures were sent.  Subjective: Seen this morning mild confusion at baseline alert awake oriented redness on the leg much improved but he still swollen  Assessment & Plan:  Sepsis due to cellulitis POA Cellulitis right foot more than left with foreign body: met sepsis criteria with heart rate >90, leukocytosis 15.7K,Foreign body removed from foot.  Cont IV antibiotics-vancomycin and cefepime, and redness improving nicely.  CT showed severe diffuse soft tissue  swelling nonspecific could be from cellulitis, no acute osseous abnormalities.  Leukocytosis improving.  Blood culture pending Recent Labs  Lab 01/16/21 1633 01/17/21 0601 01/18/21 0034  WBC 15.7*  --  11.4*  LATICACIDVEN  --  1.0  --     Acute lower UTI 2/2 Proteus mirabilis-on cefepime as above.  AKI on CKD stage IV:BUN/creatinine 53/4.2 recent baseline 2.8-3.4, home torsemide held, placed on gentle IV fluids-BUN/creatinine remains same.  Has lower leg edema will elevate. Recent Labs  Lab 01/16/21 1633 01/17/21 1300 01/18/21 0034  BUN 53* 57* 57*  CREATININE 4.29* 4.29* 4.17*    Hyponatremia:on ivf, holding diuretics.  Sodium remains the same.  Monitor Recent Labs  Lab 01/16/21 1633 01/17/21 1300 01/18/21 0034  NA 129* 130* 129*    HLD Chronic diastolic CHF bnp 299, with leg edema, diuretics on hold due to AKI and hyponatremia hopefully can resume soon HTN: BP well controlled, on Coreg.  Also on hydralazine but was not taking at home it seems. GERD-continue PPI PAF on amiodarone and Coreg ,Eliquis discontinued recently for GI bleed CAD on statin and beta-blocker Positive troponin 31> 69> 31, transiently high without chest pain.  EKG shows A. fib no acute ischemic changes-suspect demand ischemia due to patient's infection/chf-obtained echo to eval LVEF/wall motion-no regional wall motion abnormalities.  Stable. Anemia of chronic disease/ chronic blood loss: Noted drop in hemoglobin could be from hemodilution.  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  Diet Order             Diet Heart Room service  appropriate? Yes; Fluid consistency: Thin  Diet effective now                         Patient's Body mass index is 31.39 kg/m. DVT prophylaxis: SCDs Start: 01/17/21 0618 Code Status:   Code Status: DNR  Family Communication: plan of care discussed with  patient at bedside. Status is: Inpatient  Remains inpatient appropriate because:IV treatments appropriate due to intensity of illness or inability to take PO and Inpatient level of care appropriate due to severity of illness  Dispo: The patient is from: Home              Anticipated d/c is to: SNF              Patient currently is not medically stable to d/c.   Difficult to place patient No       Unresulted Labs (From admission, onward)     Start     Ordered   01/18/21 0500  Renal function panel  Daily,   R      01/17/21 0623   01/17/21 0351  Blood culture (routine x 2)  BLOOD CULTURE X 2,   STAT      01/17/21 0351           Medications reviewed:  Scheduled Meds:  amiodarone  200 mg Oral Q lunch   atorvastatin  20 mg Oral Q lunch   carvedilol  6.25 mg Oral BID WC   escitalopram  10 mg Oral Q lunch   ferrous sulfate  325 mg Oral Q lunch   folic acid  1 mg Oral Q lunch   hydrALAZINE  50 mg Oral TID   magnesium oxide  400 mg Oral Q lunch   nystatin   Topical BID   pantoprazole  40 mg Oral Q lunch   Continuous Infusions:  sodium chloride 75 mL/hr at 01/18/21 1135   ceFEPime (MAXIPIME) IV 200 mL/hr at 01/18/21 0652   [START ON 01/19/2021] vancomycin     Consultants:see note  Procedures:see note Antimicrobials: Anti-infectives (From admission, onward)    Start     Dose/Rate Route Frequency Ordered Stop   01/19/21 0800  vancomycin (VANCOREADY) IVPB 1500 mg/300 mL        1,500 mg 150 mL/hr over 120 Minutes Intravenous Every 48 hours 01/17/21 0531     01/17/21 0700  ceFEPIme (MAXIPIME) 2 g in sodium chloride 0.9 % 100 mL IVPB        2 g 200 mL/hr over 30 Minutes Intravenous Every 24 hours 01/17/21 0623 01/24/21 0659   01/17/21 0630  vancomycin (VANCOCIN) IVPB 1000 mg/200 mL premix  Status:  Discontinued        1,000 mg 200 mL/hr over 60 Minutes Intravenous  Once 01/17/21 0623 01/17/21 0624   01/17/21 0530  vancomycin (VANCOREADY) IVPB 2000 mg/400 mL        2,000  mg 200 mL/hr over 120 Minutes Intravenous  Once 01/17/21 0526 01/17/21 1118   01/17/21 0515  cefTRIAXone (ROCEPHIN) 1 g in sodium chloride 0.9 % 100 mL IVPB        1 g 200 mL/hr over 30 Minutes Intravenous  Once 01/17/21 0501 01/17/21 0658      Culture/Microbiology    Component Value Date/Time   SDES URINE, CLEAN CATCH 01/16/2021 0427   SPECREQUEST NONE 01/16/2021 0427   CULT (A) 01/16/2021 0427    >=100,000 COLONIES/mL PROTEUS MIRABILIS SUSCEPTIBILITIES TO FOLLOW Performed at [facility]  Hospital Lab, [place] [address], [place], [place] [address]    REPTSTATUS PENDING 01/16/2021 7425    Other culture-see note  Objective: Vitals: Today's Vitals   01/18/21 0402 01/18/21 0828 01/18/21 0856 01/18/21 0900  BP: (!) 122/54 130/64 (!) 144/62 (!) 144/62  Pulse: 74 75  80  Resp: (!) 21 20  20   Temp: 99.4 F (37.4 C) (!) 97.3 F (36.3 C)    TempSrc: Oral Oral    SpO2: 95% 97%  100%  Weight:      Height:      PainSc: 0-No pain       Intake/Output Summary (Last 24 hours) at 01/18/2021 1154 Last data filed at 01/18/2021 9563 Gross per 24 hour  Intake 840.5 ml  Output 3 ml  Net 837.5 ml   Filed Weights   01/17/21 0459  Weight: 105 kg   Weight change:   Intake/Output from previous day: 09/11 0701 - 09/12 0700 In: 1840.5 [I.V.:835.2; IV Piggyback:1005.3] Out: 3 [Urine:3] Intake/Output this shift: No intake/output data recorded. Filed Weights   01/17/21 0459  Weight: 105 kg   Examination: General exam: AAO , pleasant, not in distress, on room air. HEENT:Oral mucosa moist, Ear/Nose WNL grossly,dentition normal. Respiratory system: bilaterally clear breath sounds without added sounds no use of accessory muscle, non tender. Cardiovascular system: S1 & S2 +,No JVD. Gastrointestinal system: Abdomen soft, NT,ND, BS+. Nervous System:Alert, awake, moving extremities Extremities: Bilateral pitting edema lower extremities with mild redness on the right lower extremity much improved from  previous Skin: No rashes,no icterus. MSK: Normal muscle bulk,tone, power.  Data Reviewed: I have personally reviewed following labs and imaging studies CBC: Recent Labs  Lab 01/16/21 1633 01/18/21 0034  WBC 15.7* 11.4*  NEUTROABS 14.2*  --   HGB 8.6* 7.6*  HCT 25.4* 22.1*  MCV 94.4 91.7  PLT 238 875   Basic Metabolic Panel: Recent Labs  Lab 01/16/21 1633 01/17/21 1300 01/18/21 0034  NA 129* 130* 129*  K 3.5 3.4* 3.5  CL 94* 98 99  CO2 20* 18* 19*  GLUCOSE 112* 101* 113*  BUN 53* 57* 57*  CREATININE 4.29* 4.29* 4.17*  CALCIUM 9.0 8.3* 8.2*  PHOS  --   --  3.1   GFR: Estimated Creatinine Clearance: 18 mL/min (A) (by C-G formula based on SCr of 4.17 mg/dL (H)). Liver Function Tests: Recent Labs  Lab 01/16/21 1633 01/18/21 0034  AST 23  --   ALT 14  --   ALKPHOS 72  --   BILITOT 1.0  --   PROT 6.2*  --   ALBUMIN 3.0* 2.2*   No results for input(s): LIPASE, AMYLASE in the last 168 hours. No results for input(s): AMMONIA in the last 168 hours. Coagulation Profile: Recent Labs  Lab 01/16/21 1633  INR 1.2   Cardiac Enzymes: No results for input(s): CKTOTAL, CKMB, CKMBINDEX, TROPONINI in the last 168 hours. BNP (last 3 results) No results for input(s): PROBNP in the last 8760 hours. HbA1C: No results for input(s): HGBA1C in the last 72 hours. CBG: No results for input(s): GLUCAP in the last 168 hours. Lipid Profile: No results for input(s): CHOL, HDL, LDLCALC, TRIG, CHOLHDL, LDLDIRECT in the last 72 hours. Thyroid Function Tests: No results for input(s): TSH, T4TOTAL, FREET4, T3FREE, THYROIDAB in the last 72 hours. Anemia Panel: No results for input(s): VITAMINB12, FOLATE, FERRITIN, TIBC, IRON, RETICCTPCT in the last 72 hours. Sepsis Labs: Recent Labs  Lab 01/17/21 0601  LATICACIDVEN 1.0    Recent Results (  from the past 240 hour(s))  Urine Culture     Status: Abnormal (Preliminary result)   Collection Time: 01/16/21  4:27 AM   Specimen: Urine,  Clean Catch  Result Value Ref Range Status   Specimen Description URINE, CLEAN CATCH  Final   Special Requests NONE  Final   Culture (A)  Final    >=100,000 COLONIES/mL PROTEUS MIRABILIS SUSCEPTIBILITIES TO FOLLOW Performed at [facility], [address], [place], [place] [address]    Report Status PENDING  Incomplete  Resp Panel by RT-PCR (Flu A&B, Covid)     Status: None   Collection Time: 01/17/21  3:52 AM   Specimen: Nasopharyngeal(NP) swabs in vial transport medium  Result Value Ref Range Status   SARS Coronavirus 2 by RT PCR NEGATIVE NEGATIVE Final    Comment: (NOTE) SARS-CoV-2 target nucleic acids are NOT DETECTED.  The SARS-CoV-2 RNA is generally detectable in upper respiratory specimens during the acute phase of infection. The lowest concentration of SARS-CoV-2 viral copies this assay can detect is 138 copies/mL. A negative result does not preclude SARS-Cov-2 infection and should not be used as the sole basis for treatment or other patient management decisions. A negative result may occur with  improper specimen collection/handling, submission of specimen other than nasopharyngeal swab, presence of viral mutation(s) within the areas targeted by this assay, and inadequate number of viral copies(<138 copies/mL). A negative result must be combined with clinical observations, patient history, and epidemiological information. The expected result is Negative.  Fact Sheet for Patients:  EntrepreneurPulse.com.au  Fact Sheet for Healthcare Providers:  IncredibleEmployment.be  This test is no t yet approved or cleared by the [place] FDA and  has been authorized for detection and/or diagnosis of SARS-CoV-2 by FDA under an Emergency Use Authorization (EUA). This EUA will remain  in effect (meaning this test can be used) for the duration of the COVID-19 declaration under Section 564(b)(1) of the Act, 21 U.S.C.section  360bbb-3(b)(1), unless the authorization is terminated  or revoked sooner.       Influenza A by PCR NEGATIVE NEGATIVE Final   Influenza B by PCR NEGATIVE NEGATIVE Final    Comment: (NOTE) The Xpert Xpress SARS-CoV-2/FLU/RSV plus assay is intended as an aid in the diagnosis of influenza from Nasopharyngeal swab specimens and should not be used as a sole basis for treatment. Nasal washings and aspirates are unacceptable for Xpert Xpress SARS-CoV-2/FLU/RSV testing.  Fact Sheet for Patients: EntrepreneurPulse.com.au  Fact Sheet for Healthcare Providers: IncredibleEmployment.be  This test is not yet approved or cleared by the [place] FDA and has been authorized for detection and/or diagnosis of SARS-CoV-2 by FDA under an Emergency Use Authorization (EUA). This EUA will remain in effect (meaning this test can be used) for the duration of the COVID-19 declaration under Section 564(b)(1) of the Act, 21 U.S.C. section 360bbb-3(b)(1), unless the authorization is terminated or revoked.  Performed at [facility], [place] [address] [place], [place] [address]      Radiology Studies: DG Chest 1 View  Result Date: 01/17/2021 CLINICAL DATA:  80 year old male with shortness of breath. Multiple falls. EXAM: CHEST  1 VIEW COMPARISON:  Portable chest 09/08/2020 and earlier. FINDINGS: Portable AP view at 0426 hours. Low lung volumes similar to those in May. Continued patchy and veiling bibasilar lung opacity, new from last year. Associated increased pulmonary vascularity. No pneumothorax. No air bronchograms. Stable cardiac size and mediastinal contours. Visualized tracheal air column is within normal limits. Multilevel right  lateral rib fractures again noted. Paucity of bowel gas in the upper abdomen. IMPRESSION: 1. Lower lung volumes with patchy and veiling bibasilar opacity, diffuse pulmonary vascular congestion. Suspect interstitial edema with bibasilar  atelectasis and possible small pleural effusions. These findings are new from last year and unchanged compared to May. 2. Chronic right rib fractures. Electronically Signed   By: Genevie Ann M.D.   On: 01/17/2021 05:27   DG Lumbar Spine Complete  Result Date: 01/17/2021 CLINICAL DATA:  80 year old male with shortness of breath. Multiple falls. EXAM: LUMBAR SPINE - COMPLETE 4+ VIEW COMPARISON:  Lumbar radiographs 11/25/2020. FINDINGS: Normal lumbar segmentation. Stable lumbar lordosis. Stable vertebral height and alignment. Diffuse severe disc space loss and bulky endplate spurring in the lower thoracic and lumbar spine. Widespread lumbar facet hypertrophy. No pars fracture. Visible sacrum and SI joints appear intact. No acute osseous abnormality identified. Bilateral hip arthroplasty. Abdominal Calcified aortic atherosclerosis. Visible bowel-gas pattern is within normal limits. IMPRESSION: 1. Diffuse severe spine degeneration. No acute osseous abnormality identified in the lumbar spine. 2.  Aortic Atherosclerosis (ICD10-I70.0). Electronically Signed   By: Genevie Ann M.D.   On: 01/17/2021 05:24   CT HEAD WO CONTRAST (5MM)  Result Date: 01/16/2021 CLINICAL DATA:  Head trauma. EXAM: CT HEAD WITHOUT CONTRAST TECHNIQUE: Contiguous axial images were obtained from the base of the skull through the vertex without intravenous contrast. COMPARISON:  Sep 07, 2020 FINDINGS: Brain: No evidence of acute infarction, hemorrhage, hydrocephalus, extra-axial collection or mass lesion/mass effect. Severe brain parenchymal volume loss and deep white matter microangiopathy. Vascular: No hyperdense vessel or unexpected calcification. Skull: Normal. Negative for fracture or focal lesion. Sinuses/Orbits: No acute finding. Other: None. IMPRESSION: 1. No acute intracranial abnormality. 2. Severe brain parenchymal atrophy and chronic microvascular disease. Electronically Signed   By: Fidela Salisbury M.D.   On: 01/16/2021 17:31   CT  Cervical Spine Wo Contrast  Result Date: 01/16/2021 CLINICAL DATA:  [place], neck trauma EXAM: CT CERVICAL SPINE WITHOUT CONTRAST TECHNIQUE: Multidetector CT imaging of the cervical spine was performed without intravenous contrast. Multiplanar CT image reconstructions were also generated. COMPARISON:  07/08/2020 FINDINGS: Alignment: Alignment is grossly anatomic and stable. Skull base and vertebrae: No acute fracture. No primary bone lesion or focal pathologic process. Soft tissues and spinal canal: No prevertebral fluid or swelling. No visible canal hematoma. Disc levels: Stable multilevel spondylosis and facet hypertrophy. Disc space narrowing and osteophyte formation are greatest at the C5-6, C6-7, and C7-T1 levels. There is marked facet hypertrophy from C2 through C5. Upper chest: Central airway is patent.  Lung apices are clear. Other: Reconstructed images demonstrate no additional findings. IMPRESSION: 1. No acute cervical spine fracture. 2. Stable multilevel cervical spondylosis and facet hypertrophy. Electronically Signed   By: Randa Ngo M.D.   On: 01/16/2021 19:46   CT Foot Right Wo Contrast  Result Date: 01/17/2021 CLINICAL DATA:  Right foot pain and swelling. EXAM: CT OF THE RIGHT FOOT WITHOUT CONTRAST TECHNIQUE: Multidetector CT imaging of the right foot was performed according to the standard protocol. Multiplanar CT image reconstructions were also generated. COMPARISON:  Right foot x-rays from same day. FINDINGS: Bones/Joint/Cartilage No bony destruction or periosteal reaction. Focal thinning of the cortex along the anterior aspect of the lateral talar dome with underlying marrow lucency, but maintained marrow fat density and overlying soft tissue fat plane, and therefore likely focal osteopenia. No acute fracture or dislocation. Old healed fracture of the first proximal phalanx. Mild first MTP joint osteoarthritis. Chronic juxta-articular  erosions involving the medial first metatarsal head,  suggestive of gout. No joint effusion. Ligaments Ligaments are suboptimally evaluated by CT. Muscles and Tendons Grossly intact. Diffuse fatty atrophy of the intrinsic foot muscles. Soft tissue Severe diffuse soft tissue swelling. No fluid collection or hematoma. No soft tissue mass. IMPRESSION: 1. No acute osseous abnormality. No CT evidence of osteomyelitis. 2. Severe diffuse soft tissue swelling, nonspecific, but can be seen in the setting of cellulitis. 3. Chronic juxta-articular erosions involving the medial first metatarsal head, suggestive of gout. Electronically Signed   By: Titus Dubin M.D.   On: 01/17/2021 09:15   DG Foot 2 Views Right  Result Date: 01/17/2021 CLINICAL DATA:  Removal of foreign body. EXAM: RIGHT FOOT - 2 VIEW COMPARISON:  Earlier today at 4:29 a.m. FINDINGS: 6:23 a.m. Degenerative changes about the first metatarsophalangeal joint. Interval removal of the radiopaque foreign body/needle. Diffuse soft tissue swelling again identified. Vascular calcifications. IMPRESSION: Removal of radiopaque foreign body. Soft tissue swelling. Electronically Signed   By: Abigail Miyamoto M.D.   On: 01/17/2021 06:52   DG Foot 2 Views Right  Result Date: 01/17/2021 CLINICAL DATA:  80 year old male with multiple falls. EXAM: RIGHT FOOT - 2 VIEW COMPARISON:  None. FINDINGS: There is a 16 mm linear metallic retained foreign body projecting along the plantar surface of the 2nd and 3rd metatarsal heads, perhaps a needle fragment. No soft tissue gas, but severe soft tissue swelling in the foot at the level of the metatarsals. Calcified peripheral vascular disease at the ankle. Calcaneus is intact with degenerative spurring. No tarsal or metatarsal fracture. Phalanges appear intact. Intermittent joint degeneration in the foot. No cortical osteolysis. No acute osseous abnormality identified. IMPRESSION: 1. Severe soft tissue swelling. No acute osseous abnormality identified. 2. A 16 mm linear metallic retained  foreign body along the plantar surface of the 2nd and 3rd metatarsal heads may be a needle fragment. Electronically Signed   By: Genevie Ann M.D.   On: 01/17/2021 05:22   ECHOCARDIOGRAM COMPLETE  Result Date: 01/17/2021    ECHOCARDIOGRAM REPORT   Patient Name:   Darryl Diaz Date of Exam: 01/17/2021 Medical Rec #:  [id number]        Height:       72.0 in Accession #:    [id number]       Weight:       231.5 lb Date of Birth:  1941/04/13        BSA:          2.267 m Patient Age:    45 years         BP:           140/61 mmHg Patient Gender: M                HR:           85 bpm. Exam Location:  Inpatient Procedure: 2D Echo, Cardiac Doppler and Color Doppler Indications:    Elevated Troponin  History:        Patient has prior history of Echocardiogram examinations, most                 recent 09/08/2020. Risk Factors:Hypertension.  Sonographer:    Tawnya Crook Referring Phys: [id number] [place] IMPRESSIONS  1. Left ventricular ejection fraction, by estimation, is 60 to 65%. The left ventricle has normal function. The left ventricle has no regional wall motion abnormalities. There is mild concentric left ventricular hypertrophy. Left ventricular diastolic function could  not be evaluated.  2. Right ventricular systolic function is normal. The right ventricular size is normal. There is normal pulmonary artery systolic pressure.  3. Left atrial size was severely dilated.  4. The mitral valve is normal in structure. Trivial mitral valve regurgitation. No evidence of mitral stenosis.  5. The aortic valve is tricuspid. There is mild calcification of the aortic valve. There is mild thickening of the aortic valve. Aortic valve regurgitation is not visualized. Mild aortic valve stenosis. Aortic valve area, by VTI measures 3.20 cm. Aortic valve mean gradient measures 12.0 mmHg. Aortic valve Vmax measures 2.35 m/s.  6. Aortic dilatation noted. There is mild dilatation of the aortic root, measuring 40 mm.  7. The inferior vena cava  is dilated in size with >50% respiratory variability, suggesting right atrial pressure of 8 mmHg. FINDINGS  Left Ventricle: Left ventricular ejection fraction, by estimation, is 60 to 65%. The left ventricle has normal function. The left ventricle has no regional wall motion abnormalities. The left ventricular internal cavity size was normal in size. There is  mild concentric left ventricular hypertrophy. Left ventricular diastolic function could not be evaluated. Right Ventricle: The right ventricular size is normal. No increase in right ventricular wall thickness. Right ventricular systolic function is normal. There is normal pulmonary artery systolic pressure. The tricuspid regurgitant velocity is 1.57 m/s, and  with an assumed right atrial pressure of 8 mmHg, the estimated right ventricular systolic pressure is 22.9 mmHg. Left Atrium: Left atrial size was severely dilated. Right Atrium: Right atrial size was normal in size. Pericardium: There is no evidence of pericardial effusion. Mitral Valve: The mitral valve is normal in structure. Trivial mitral valve regurgitation. No evidence of mitral valve stenosis. Tricuspid Valve: The tricuspid valve is normal in structure. Tricuspid valve regurgitation is not demonstrated. No evidence of tricuspid stenosis. Aortic Valve: The aortic valve is tricuspid. There is mild calcification of the aortic valve. There is mild thickening of the aortic valve. Aortic valve regurgitation is not visualized. Mild aortic stenosis is present. Aortic valve mean gradient measures  12.0 mmHg. Aortic valve peak gradient measures 22.1 mmHg. Aortic valve area, by VTI measures 3.20 cm. Pulmonic Valve: The pulmonic valve was normal in structure. Pulmonic valve regurgitation is trivial. No evidence of pulmonic stenosis. Aorta: Aortic dilatation noted. There is mild dilatation of the aortic root, measuring 40 mm. Venous: The inferior vena cava is dilated in size with greater than 50% respiratory  variability, suggesting right atrial pressure of 8 mmHg. IAS/Shunts: No atrial level shunt detected by color flow Doppler.  LEFT VENTRICLE PLAX 2D LVIDd:         5.30 cm     Diastology LVIDs:         3.50 cm     LV e' medial:  9.79 cm/s LV PW:         1.05 cm     LV e' lateral: 8.16 cm/s LV IVS:        1.30 cm LVOT diam:     2.30 cm LV SV:         160 LV SV Index:   71 LVOT Area:     4.15 cm  LV Volumes (MOD) LV vol d, MOD A4C: 71.9 ml LV vol s, MOD A4C: 26.7 ml LV SV MOD A4C:     71.9 ml RIGHT VENTRICLE             IVC RV S prime:     19.40 cm/s  IVC diam: 3.00 cm TAPSE (M-mode): 2.5 cm LEFT ATRIUM             Index       RIGHT ATRIUM           Index LA diam:        4.60 cm 2.03 cm/m  RA Area:     17.20 cm LA Vol (A2C):   86.6 ml 38.20 ml/m RA Volume:   35.30 ml  15.57 ml/m LA Vol (A4C):   91.7 ml 40.45 ml/m LA Biplane Vol: 90.0 ml 39.70 ml/m  AORTIC VALVE                    PULMONIC VALVE AV Area (Vmax):    3.08 cm     PV Vmax:       0.81 m/s AV Area (Vmean):   3.04 cm     PV Peak grad:  2.6 mmHg AV Area (VTI):     3.20 cm AV Vmax:           235.00 cm/s AV Vmean:          164.000 cm/s AV VTI:            0.501 m AV Peak Grad:      22.1 mmHg AV Mean Grad:      12.0 mmHg LVOT Vmax:         174.00 cm/s LVOT Vmean:        120.000 cm/s LVOT VTI:          0.386 m LVOT/AV VTI ratio: 0.77  AORTA Ao Root diam: 4.00 cm Ao Asc diam:  3.00 cm TRICUSPID VALVE TR Peak grad:   9.9 mmHg TR Vmax:        157.00 cm/s  SHUNTS Systemic VTI:  0.39 m Systemic Diam: 2.30 cm Skeet Latch MD Electronically signed by Skeet Latch MD Signature Date/Time: 01/17/2021/3:04:02 PM    Final      LOS: 1 day   Antonieta Pert, MD [facility]  01/18/2021, 11:54 AM

## 2021-01-18 NOTE — TOC Initial Note (Addendum)
Transition of Care [facility]) - Initial/Assessment Note    Patient Details  Name: Darryl Diaz MRN: [id number] Date of Birth: August 31, 1940  Transition [facility]) CM/SW Contact:    Benard Halsted, LCSW Phone Number: 01/18/2021, 9:32 AM  Clinical Narrative:                 Patient resides at [facility]. CSW spoke with Claiborne Billings at [facility] and confirmed they can accept him on their SNF side today under the Medicare waiver if medically ready for discharge however patient's pasrr is pending. CSW will continue to follow.   Expected Discharge Plan: Skilled Nursing Facility Barriers to Discharge: Continued Medical Work up   Patient Goals and CMS Choice Patient states their goals for this hospitalization and ongoing recovery are:: Rehab CMS Medicare.gov Compare Post Acute Care list provided to:: Patient Choice offered to / list presented to : Patient  Expected Discharge Plan and Services Expected Discharge Plan: [place] In-house Referral: Clinical Social Work   Post Acute Care Choice: [place] Living arrangements for the past 2 months: [place]                                      Prior Living Arrangements/Services Living arrangements for the past 2 months: [place] Lives with:: Self Patient language and need for interpreter reviewed:: Yes Do you feel safe going back to the place where you live?: Yes      Need for Family Participation in Patient Care: Yes (Comment) Care giver support system in place?: Yes (comment) Current home services: DME (shower seat/ rollator/ walker/ cane/ wheelchair) Criminal Activity/Legal Involvement Pertinent to Current Situation/Hospitalization: No - Comment as needed  Activities of Daily Living Home Assistive Devices/Equipment: Environmental consultant (specify type) ADL Screening (condition at time of admission) Patient's cognitive ability adequate to safely complete daily  activities?: Yes Is the patient deaf or have difficulty hearing?: No Does the patient have difficulty seeing, even when wearing glasses/contacts?: No Does the patient have difficulty concentrating, remembering, or making decisions?: No Patient able to express need for assistance with ADLs?: Yes Does the patient have difficulty dressing or bathing?: No Independently performs ADLs?: Yes (appropriate for developmental age) Does the patient have difficulty walking or climbing stairs?: Yes Weakness of Legs: Both Weakness of Arms/Hands: None  Permission Sought/Granted Permission sought to share information with : Facility Art therapist granted to share information with : Yes, Verbal Permission Granted     Permission granted to share info w AGENCY: [place]        Emotional Assessment Appearance:: Appears stated age Attitude/Demeanor/Rapport: Engaged Affect (typically observed): Accepting, Appropriate Orientation: : Oriented to Self, Oriented to Place, Oriented to  Time, Oriented to Situation Alcohol / Substance Use: Not Applicable Psych Involvement: No (comment)  Admission diagnosis:  Pain [R52] Cellulitis of right foot [L03.115] Acute cystitis without hematuria [N30.00] AKI (acute kidney injury) ([place]) [N17.9] Cellulitis of right lower leg [I69.629] Fall, initial encounter [W19.XXXA] Patient Active Problem List   Diagnosis Date Noted   Cellulitis of right foot 01/17/2021   Normocytic anemia 01/17/2021   Acute lower UTI 01/17/2021   Acute GI bleeding 01/06/2021   Symptomatic anemia 12/02/2020   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   Orthostatic hypotension 11/23/2020   Acute renal insufficiency 10/23/2020   Alcohol abuse 10/23/2020   Hypokalemia 10/23/2020   Hypomagnesemia 10/23/2020  Low back pain at multiple sites 10/23/2020   Malnutrition of mild degree Altamease Oiler: 75% to less than 90% of standard weight) ([place])  10/23/2020   Open wound of scalp without complication 18/29/9371   Oral candidiasis 10/23/2020   Cellulitis of left foot    Cellulitis of toe of left foot 10/13/2020   Skin ulcer of left great toe ([place]) 10/13/2020   CKD (chronic kidney disease) stage 4, 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/13/2020   Chronic heart failure with preserved ejection fraction (HFpEF) ([place]) 10/13/2020   Transient weakness of left lower extremity 09/07/2020   Heme positive stool 05/14/2020   Abnormality of gait 04/27/2020   Salmonella food poisoning 69/67/8938   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[place]) 03/17/2020   Yeast infection 03/16/2020   Diarrhea 03/16/2020   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 (HCC)    Allergies    Anemia, chronic disease    History of hypertension    Debility    Syncope and collapse 02/22/2020   AKI (acute kidney injury) ([place]) 02/10/2020   Unspecified protein-calorie malnutrition ([place]) 02/10/2020   Weakness 02/06/2020   History of repair of hip joint 10/15/2019   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   Pain in right foot 06/27/2019   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   Bradycardia 04/20/2019   Chronic diastolic heart failure ([place]) 03/14/2019   Atrial fibrillation with rapid ventricular response ([place])    Gastrointestinal hemorrhage 02/25/2019   Fall at home, initial encounter 02/25/2019   Bilateral lower extremity edema 02/25/2019   Hyponatremia 02/25/2019   Fall 02/25/2019   Benign prostatic hyperplasia with lower urinary tract symptoms 12/19/2018   Iron deficiency anemia due to chronic blood loss 08/11/2018   Anemia 08/11/2018   Hardening of the aorta (main artery of the heart) ([place]) 08/11/2018   Benign neoplasm of colon 06/18/2018   History of adenomatous polyp of colon 08/10/2017   PAF (paroxysmal atrial fibrillation) ([place]) 02/05/2017   Impaired fasting glucose 06/10/2016   Encounter for general adult medical examination without abnormal findings  06/03/2016   Posterior dislocation of hip, closed ([place]) 04/30/2016   Hip fracture ([place]) 03/30/2016   Hypertension 03/30/2016   Hyperlipidemia 03/30/2016   Osteoarthritis 03/30/2016   Depressive disorder 03/30/2016   Rhabdomyolysis 03/30/2016   Leukocytosis 03/30/2016   Pressure ulcer 03/30/2016   Fracture of hip ([place]) 03/30/2016   Gastroesophageal reflux disease 12/17/2015   Paroxysmal atrial fibrillation ([place]) 12/17/2015   Hypertensive disorder 12/17/2015   Long term (current) use of anticoagulants 12/17/2015   Moderate major depression, single episode ([place]) 12/17/2015   Pure hypercholesterolemia 12/17/2015   Seasonal allergic rhinitis 12/17/2015   CAD (coronary artery disease) 08/21/2015   PCP:  [name], MD Pharmacy:   [facility], [address] [place] [address] Phone: [phone] Fax: [phone]     Social Determinants of Health (SDOH) Interventions    Readmission Risk Interventions Readmission Risk Prevention Plan 01/18/2021 03/18/2020 03/05/2020  Transportation Screening Complete Complete Complete  PCP or Specialist Appt within 5-7 Days - - -  Home Care Screening - - -  Medication Review (RN CM) - - -  [place] or [place] - Complete Complete  Social Work Consult for [place] Planning/Counseling - Complete Complete  Palliative Care Screening - Not Applicable Not Applicable  Medication Review (RN Care Manager) Complete Complete Complete  PCP or Specialist appointment within 3-5 days of discharge Complete - -  [place] or Home Care Consult Complete - -  SW Recovery Care/Counseling Consult Complete - -  Palliative Care Screening Not Applicable - -  Skilled Nursing Facility Complete - -  Some recent data might be hidden

## 2021-01-18 NOTE — NC FL2 (Addendum)
[place] LEVEL OF CARE SCREENING TOOL     IDENTIFICATION  Patient Name: Darryl Diaz Birthdate: 03-Jun-1940 Sex: male Admission Date (Current Location): 01/16/2021  [facility] and [place] Number:  Herbalist and Address:  [facility], [place] [address] [place], [place] [address]      Provider Number: [id number]  Attending Physician Name and Address:  Antonieta Pert, MD  Relative Name and Phone Number:       Current Level of Care: Hospital Recommended Level of Care: [place] Prior Approval Number:    Date Approved/Denied:   PASRR Number:  [id number]  Discharge Plan: SNF    Current Diagnoses: Patient Active Problem List   Diagnosis Date Noted   Cellulitis of right foot 01/17/2021   Normocytic anemia 01/17/2021   Acute lower UTI 01/17/2021   Acute GI bleeding 01/06/2021   Symptomatic anemia 12/02/2020   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   Orthostatic hypotension 11/23/2020   Acute renal insufficiency 10/23/2020   Alcohol abuse 10/23/2020   Hypokalemia 10/23/2020   Hypomagnesemia 10/23/2020   Low back pain at multiple sites 10/23/2020   Malnutrition of mild degree Altamease Oiler: 75% to less than 90% of standard weight) ([place]) 10/23/2020   Open wound of scalp without complication 54/65/6812   Oral candidiasis 10/23/2020   Cellulitis of left foot    Cellulitis of toe of left foot 10/13/2020   Skin ulcer of left great toe ([place]) 10/13/2020   CKD (chronic kidney disease) stage 4, 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/13/2020   Chronic heart failure with preserved ejection fraction (HFpEF) ([place]) 10/13/2020   Transient weakness of left lower extremity 09/07/2020   Heme positive stool 05/14/2020   Abnormality of gait 04/27/2020   Salmonella food poisoning 75/17/0017   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[place]) 03/17/2020   Yeast infection 03/16/2020   Diarrhea 03/16/2020    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 (HCC)    Allergies    Anemia, chronic disease    History of hypertension    Debility    Syncope and collapse 02/22/2020   AKI (acute kidney injury) ([place]) 02/10/2020   Unspecified protein-calorie malnutrition ([place]) 02/10/2020   Weakness 02/06/2020   History of repair of hip joint 10/15/2019   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   Pain in right foot 06/27/2019   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   Bradycardia 04/20/2019   Chronic diastolic heart failure ([place]) 03/14/2019   Atrial fibrillation with rapid ventricular response ([place])    Gastrointestinal hemorrhage 02/25/2019   Fall at home, initial encounter 02/25/2019   Bilateral lower extremity edema 02/25/2019   Hyponatremia 02/25/2019   Fall 02/25/2019   Benign prostatic hyperplasia with lower urinary tract symptoms 12/19/2018   Iron deficiency anemia due to chronic blood loss 08/11/2018   Anemia 08/11/2018   Hardening of the aorta (main artery of the heart) ([place]) 08/11/2018   Benign neoplasm of colon 06/18/2018   History of adenomatous polyp of colon 08/10/2017   PAF (paroxysmal atrial fibrillation) ([place]) 02/05/2017   Impaired fasting glucose 06/10/2016   Encounter for general adult medical examination without abnormal findings 06/03/2016   Posterior dislocation of hip, closed ([place]) 04/30/2016   Hip fracture ([place]) 03/30/2016   Hypertension 03/30/2016   Hyperlipidemia 03/30/2016   Osteoarthritis 03/30/2016   Depressive disorder 03/30/2016   Rhabdomyolysis 03/30/2016   Leukocytosis 03/30/2016   Pressure ulcer 03/30/2016   Fracture of hip ([place]) 03/30/2016  Gastroesophageal reflux disease 12/17/2015   Paroxysmal atrial fibrillation ([place]) 12/17/2015   Hypertensive disorder 12/17/2015   Long term (current) use of anticoagulants 12/17/2015   Moderate major depression, single episode ([place]) 12/17/2015   Pure hypercholesterolemia 12/17/2015   Seasonal allergic rhinitis  12/17/2015   CAD (coronary artery disease) 08/21/2015    Orientation RESPIRATION BLADDER Height & Weight     Time, Self, Situation, Place  Normal Incontinent, External catheter Weight: 231 lb 7.7 oz (105 kg) Height:  6' (182.9 cm)  BEHAVIORAL SYMPTOMS/MOOD NEUROLOGICAL BOWEL NUTRITION STATUS      Incontinent Diet (see dc summary)  AMBULATORY STATUS COMMUNICATION OF NEEDS Skin   Limited Assist Verbally Other (Comment) (puncture to the foot; skin tear to the arm; non-pressure wound to the toe)                       Personal Care Assistance Level of Assistance  Bathing, Feeding, Dressing Bathing Assistance: Limited assistance Feeding assistance: Independent Dressing Assistance: Limited assistance     Functional Limitations Info  Sight Sight Info: Impaired        SPECIAL CARE FACTORS FREQUENCY  PT (By licensed PT), OT (By licensed OT)     PT Frequency: 5x/week OT Frequency: 5x/week            Contractures Contractures Info: Not present    Additional Factors Info  Code Status, Allergies Code Status Info: DNR Allergies Info: Penicillin G Sodium, Penicillins, Penicillins           Current Medications (01/18/2021):  This is the current hospital active medication list Current Facility-Administered Medications  Medication Dose Route Frequency Provider Last Rate Last Admin   0.9 %  sodium chloride infusion   Intravenous Continuous Kc, Ramesh, MD 75 mL/hr at 01/18/21 0652 Restarted at 01/18/21 0652   acetaminophen (TYLENOL) tablet 650 mg  650 mg Oral Q6H PRN Reubin Milan, MD   650 mg at 01/18/21 0019   Or   acetaminophen (TYLENOL) suppository 650 mg  650 mg Rectal Q6H PRN Reubin Milan, MD       amiodarone (PACERONE) tablet 200 mg  200 mg Oral Q lunch Reubin Milan, MD   200 mg at 01/17/21 1238   atorvastatin (LIPITOR) tablet 20 mg  20 mg Oral Q lunch Reubin Milan, MD   20 mg at 01/17/21 1239   carvedilol (COREG) tablet 6.25 mg  6.25 mg  Oral BID WC Reubin Milan, MD   6.25 mg at 01/18/21 0856   ceFEPIme (MAXIPIME) 2 g in sodium chloride 0.9 % 100 mL IVPB  2 g Intravenous Q24H Reubin Milan, MD 200 mL/hr at 01/18/21 9371 Infusion Verify at 01/18/21 0652   escitalopram (LEXAPRO) tablet 10 mg  10 mg Oral Q lunch Reubin Milan, MD   10 mg at 01/17/21 1239   ferrous sulfate tablet 325 mg  325 mg Oral Q lunch Reubin Milan, MD   325 mg at 69/67/89 3810   folic acid (FOLVITE) tablet 1 mg  1 mg Oral Q lunch Reubin Milan, MD   1 mg at 01/17/21 1239   hydrALAZINE (APRESOLINE) tablet 50 mg  50 mg Oral TID Reubin Milan, MD   50 mg at 01/18/21 0856   ketoconazole (NIZORAL) 2 % cream 1 application  1 application Topical BID PRN Reubin Milan, MD       loratadine (CLARITIN) tablet 10 mg  10 mg Oral Daily PRN Olevia Bowens,  Gerri Lins, MD       magnesium oxide (MAG-OX) tablet 400 mg  400 mg Oral Q lunch Reubin Milan, MD   400 mg at 01/17/21 1238   methocarbamol (ROBAXIN) tablet 500 mg  500 mg Oral Q6H PRN Reubin Milan, MD       nitroGLYCERIN (NITROSTAT) SL tablet 0.4 mg  0.4 mg Sublingual Q5 Min x 3 PRN Reubin Milan, MD       nystatin (MYCOSTATIN/NYSTOP) topical powder   Topical BID Antonieta Pert, MD   Given at 01/18/21 0858   ondansetron (ZOFRAN) tablet 4 mg  4 mg Oral Q6H PRN Reubin Milan, MD       Or   ondansetron [facility]) injection 4 mg  4 mg Intravenous Q6H PRN Reubin Milan, MD       pantoprazole (PROTONIX) EC tablet 40 mg  40 mg Oral Q lunch Reubin Milan, MD   40 mg at 01/17/21 1239   traZODone (DESYREL) tablet 50 mg  50 mg Oral QHS PRN Reubin Milan, MD       [START ON 01/19/2021] vancomycin (VANCOREADY) IVPB 1500 mg/300 mL  1,500 mg Intravenous Q48H Lyndee Leo, [facility]       Facility-Administered Medications Ordered in Other Encounters  Medication Dose Route Frequency Provider Last Rate Last Admin   metoprolol tartrate (LOPRESSOR) injection    Anesthesia  Intra-op Mariea Clonts, CRNA   5 mg at 02/27/19 1318     Discharge Medications: Please see discharge summary for a list of discharge medications.  Relevant Imaging Results:  Relevant Lab Results:   Additional Information [id number] Covid vaccinated x3  Benard Halsted, LCSW

## 2021-01-18 NOTE — Progress Notes (Signed)
RE: Darryl Diaz Date of Birth: 2040-05-15 Date: 01/18/21  Please be advised that the above-named patient will require a short-term nursing home stay - anticipated 30 days or less for rehabilitation and strengthening.  The plan is for return home.

## 2021-01-19 LAB — VANCOMYCIN, RANDOM: Vancomycin Rm: 14

## 2021-01-19 LAB — NA AND K (SODIUM & POTASSIUM), RAND UR
Potassium Urine: 24 mmol/L
Sodium, Ur: 77 mmol/L

## 2021-01-19 LAB — PREPARE RBC (CROSSMATCH)

## 2021-01-19 LAB — CBC
HCT: 20.8 % — ABNORMAL LOW (ref 39.0–52.0)
Hemoglobin: 7.1 g/dL — ABNORMAL LOW (ref 13.0–17.0)
MCH: 31.4 pg (ref 26.0–34.0)
MCHC: 34.1 g/dL (ref 30.0–36.0)
MCV: 92 fL (ref 80.0–100.0)
Platelets: 185 10*3/uL (ref 150–400)
RBC: 2.26 MIL/uL — ABNORMAL LOW (ref 4.22–5.81)
RDW: 13.2 % (ref 11.5–15.5)
WBC: 8.9 10*3/uL (ref 4.0–10.5)
nRBC: 0 % (ref 0.0–0.2)

## 2021-01-19 LAB — HEMOGLOBIN AND HEMATOCRIT, BLOOD
HCT: 23.9 % — ABNORMAL LOW (ref 39.0–52.0)
Hemoglobin: 8 g/dL — ABNORMAL LOW (ref 13.0–17.0)

## 2021-01-19 LAB — OSMOLALITY, URINE: Osmolality, Ur: 306 mOsm/kg (ref 300–900)

## 2021-01-19 LAB — URINE CULTURE: Culture: >=100000 — AB

## 2021-01-19 LAB — BASIC METABOLIC PANEL
Anion gap: 11 (ref 5–15)
BUN: 59 mg/dL — ABNORMAL HIGH (ref 8–23)
CO2: 15 mmol/L — ABNORMAL LOW (ref 22–32)
Calcium: 8.1 mg/dL — ABNORMAL LOW (ref 8.9–10.3)
Chloride: 103 mmol/L (ref 98–111)
Creatinine, Ser: 3.91 mg/dL — ABNORMAL HIGH (ref 0.61–1.24)
GFR, Estimated: 15 mL/min — ABNORMAL LOW (ref >60)
Glucose, Bld: 102 mg/dL — ABNORMAL HIGH (ref 70–99)
Potassium: 3.6 mmol/L (ref 3.5–5.1)
Sodium: 129 mmol/L — ABNORMAL LOW (ref 135–145)

## 2021-01-19 LAB — BRAIN NATRIURETIC PEPTIDE: B Natriuretic Peptide: 607.4 pg/mL — ABNORMAL HIGH (ref 0.0–100.0)

## 2021-01-19 MED ORDER — SODIUM CHLORIDE 0.9% IV SOLUTION: Freq: Once | INTRAVENOUS | Status: AC

## 2021-01-19 MED ORDER — FUROSEMIDE 10 MG/ML IJ SOLN
20.0000 mg | Freq: Once | INTRAMUSCULAR | Status: AC
  Administered 2021-01-19: 20 mg via INTRAVENOUS
  Filled 2021-01-19: qty 2

## 2021-01-19 MED ORDER — SODIUM CHLORIDE 0.9 % IV SOLN
1.0000 g | INTRAVENOUS | Status: DC
  Administered 2021-01-20 – 2021-01-22 (×3): 1 g via INTRAVENOUS
  Filled 2021-01-19 (×3): qty 10

## 2021-01-19 NOTE — Progress Notes (Signed)
Pharmacy Antibiotic Note  Darryl Diaz is a 80 y.o. male admitted on 01/16/2021 with  multiple falls over the past week .  Pharmacy has been consulted for vancomycin and cefepime dosing for lower extremity cellulitis and UTI.   Patient currently afebrile and WBC are down to normal. AKI noted, SCr down to 3.91, was <3 last week. BCx are ngtd, UCx with Proteus mirabilis sensitive to ceftriaxone. Random vancomycin level 14 and within goal range although not a steady-state level.  Plan: Vancomycin 1500 mg IV every 48 hours.  Goal trough 10-15 mcg/mL. Continue cefepime 2 g IV q24 hours - consider narrow to ceftriaxone Monitor renal function, clinical progress, cultures/sensitivities F/U LOT and de-escalate as able Vancomycin levels as clinically indicated   Height: 6' (182.9 cm) Weight: 105 kg (231 lb 7.7 oz) IBW/kg (Calculated) : 77.6  Temp (24hrs), Avg:98.2 F (36.8 C), Min:97.3 F (36.3 C), Max:98.7 F (37.1 C)  Recent Labs  Lab 01/16/21 1633 01/17/21 0601 01/17/21 1300 01/18/21 0034 01/19/21 0041 01/19/21 0646  WBC 15.7*  --   --  11.4* 8.9  --   CREATININE 4.29*  --  4.29* 4.17* 3.91*  --   LATICACIDVEN  --  1.0  --   --   --   --   VANCORANDOM  --   --   --   --   --  14     Estimated Creatinine Clearance: 19.2 mL/min (A) (by C-G formula based on SCr of 3.91 mg/dL (H)).    Allergies  Allergen Reactions   Penicillin G Sodium     Other reaction(s): rash   Penicillins Other (See Comments)    Tolerated Cephalosporin Date: 10/15/19. Allergy is from childhood Did it involve swelling of the face/tongue/throat, SOB, or low BP? Unknown Did it involve sudden or severe rash/hives, skin peeling, or any reaction on the inside of your mouth or nose? Unknown Did you need to seek medical attention at a hospital or doctor's office? Unknown When did it last happen?    childhood reaction   If all above answers are "NO", may proceed with cephalosporin use.   Penicillins      Thank you for involving pharmacy in this patient's care.  Renold Genta, PharmD, BCPS Clinical Pharmacist Clinical phone for 01/19/2021 until 3p is [phone] 01/19/2021 8:26 AM  **Pharmacist phone directory can be found on [email or web address] listed under [place]**

## 2021-01-19 NOTE — Progress Notes (Signed)
PROGRESS NOTE    Darryl Diaz  NAT:[id number] DOB: January 29, 1941 DOA: 01/16/2021 PCP: [name], MD   Chief Complaint  Patient presents with   Fall    Brief Narrative: 64yom PAF, chronic diastolic CHF, CKD stage IV, tachybradycardia syndrome, PAD with nonhealing left foot wounds, HLD, HTN, history of GI bleed, 1 disorder presented from the facility with recurrent falls about 3 in the past 1 week and was complaining of leg and low back as well as head pain.  Complaining of increasing pain and swelling to his right leg greater than left.  Eliquis when he was in the hospital for GI bleed previously 8/31-9/3   In the ED,Vitals fairly stable, received IV fluid bolus, vancomycin and ceftriaxone, He had foreign body on the right foot that was removed. Imaging:chest x-ray lower lung volumes with patchy and barely bibasilar opacity diffuse pulmonary vascular congestion, suspected interstitial edema with possible small pleural effusion new from last year but unchanged compared to May, chronic right rib fractures. X-ray lumbar spine diffuse severe spine degeneration, CT head no acute intracranial findings severe brain parenchymal atrophy with chronic microvascular disease. CT cervical spine multilevel spondylosis and facet atrophy. X-ray foot right soft tissue swelling. Labs showed abnormal UA with pyuria, leukocytosis 15.7 BNP 315, troponin 31, CMP hyponatremia 129 potassium 3.5 BUN/creatinine 53/4.2 recent baseline 2.8-3.4, blood cultures were sent. Patient was admitted for cellulitis  rt> leftin the setting of chronic leg swelling  Subjective: Seen this morning.  using the bedside commode.  No nausea vomiting.  Leg edema is improving redness is improving.  Assessment & Plan:  Sepsis due to cellulitis POA Cellulitis right lower leg more than left with foreign body: mEet sepsis criteria with heart rate >90, leukocytosis 15.7K,Foreign body removed from foot.  On vancomycin and  cefepime>change to ceftriaxone.CT showed severe diffuse soft tissue swelling nonspecific could be from cellulitis, no acute osseous abnormalities.  Leukocytosis nicely improved.  Blood culture no growth so far .  Continue compression stocking and elevate the leg. Recent Labs  Lab 01/16/21 1633 01/17/21 0601 01/18/21 0034 01/19/21 0041  WBC 15.7*  --  11.4* 8.9  LATICACIDVEN  --  1.0  --   --      Acute lower UTI 2/2 Proteus mirabilis-on cefepime as above.  Sensitive to ceftriaxone  AKI on CKD stage IV:BUN/creatinine 53/4.2 recent baseline 2.8-3.4, home torsemide held, placed on gentle IV fluids-BUN/creatinine slightly downtrending 3.9. Recent Labs  Lab 01/16/21 1633 01/17/21 1300 01/18/21 0034 01/19/21 0041  BUN 53* 57* 57* 59*  CREATININE 4.29* 4.29* 4.17* 3.91*     Hyponatremia:on ivf, holding diuretics.  Sodium remains the same.  Send urine electrolytes.  Monitor Recent Labs  Lab 01/16/21 1633 01/17/21 1300 01/18/21 0034 01/19/21 0041  NA 129* 130* 129* 129*    HLD Chronic diastolic CHF bnp 427, with leg edema, diuretics on hold due to AKI and hyponatremia .  Once creatinine stabilizes will resume diuretics.  Continue compression stockings/elevation of the legs.    HTN: BP well controlled, on Coreg.  Also on hydralazine but was not taking at home it seems-so we will stop it for now.  GERD-continue PPI  PAF on amiodarone and Coreg ,Eliquis discontinued recently for GI bleed  CAD on statin and beta-blocker Positive troponin 31> 69> 31, transiently high without chest pain.  EKG shows A. fib no acute ischemic changes-suspect demand ischemia due to patient's infection/chf-obtained echo to eval LVEF/wall motion-no regional wall motion abnormalities.  Stable.  Anemia of  chronic disease/ chronic blood loss: Hemoglobin further downtrending will transfuse 1 unit PRBC -discussed risk benefits alternatives and patient is agreeable for 1 unit transfusion.   Will dose iv Lasix in  between, and monitor H&H.   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  Diet Order             Diet Heart Room service appropriate? Yes; Fluid consistency: Thin  Diet effective now                   Patient's Body mass index is 31.39 kg/m. DVT prophylaxis: SCDs Start: 01/17/21 0618 Code Status:   Code Status: DNR  Family Communication: plan of care discussed with patient at bedside. Status is: Inpatient  Remains inpatient appropriate because:IV treatments appropriate due to intensity of illness or inability to take PO and Inpatient level of care appropriate due to severity of illness  Dispo: The patient is from: Home              Anticipated d/c is to: SNF              Patient currently is not medically stable to d/c.   Difficult to place patient No  Unresulted Labs (From admission, onward)     Start     Ordered   01/19/21 8588  Basic metabolic panel  Daily,   R     Question:  Specimen collection method  Answer:  Lab=Lab collect   01/18/21 1202   01/19/21 0500  CBC  Daily,   R     Question:  Specimen collection method  Answer:  Lab=Lab collect   01/18/21 1202           Medications reviewed:  Scheduled Meds:  amiodarone  200 mg Oral Q lunch   atorvastatin  20 mg Oral Q lunch   carvedilol  6.25 mg Oral BID WC   escitalopram  10 mg Oral Q lunch   ferrous sulfate  325 mg Oral Q lunch   folic acid  1 mg Oral Q lunch   hydrALAZINE  50 mg Oral TID   magnesium oxide  400 mg Oral Q lunch   nystatin   Topical BID   pantoprazole  40 mg Oral Q lunch   Continuous Infusions:  sodium chloride 75 mL/hr at 01/19/21 0104   ceFEPime (MAXIPIME) IV 2 g (01/19/21 0641)   vancomycin     Consultants:see note  Procedures:see note Antimicrobials: Anti-infectives (From admission, onward)    Start     Dose/Rate Route Frequency Ordered Stop    01/19/21 0800  vancomycin (VANCOREADY) IVPB 1500 mg/300 mL        1,500 mg 150 mL/hr over 120 Minutes Intravenous Every 48 hours 01/17/21 0531     01/17/21 0700  ceFEPIme (MAXIPIME) 2 g in sodium chloride 0.9 % 100 mL IVPB        2 g 200 mL/hr over 30 Minutes Intravenous Every 24 hours 01/17/21 0623 01/24/21 0659   01/17/21 0630  vancomycin (VANCOCIN) IVPB 1000 mg/200 mL premix  Status:  Discontinued        1,000 mg 200 mL/hr over 60 Minutes Intravenous  Once 01/17/21 0623 01/17/21 0624   01/17/21 0530  vancomycin (VANCOREADY) IVPB 2000 mg/400 mL        2,000 mg 200 mL/hr over 120  Minutes Intravenous  Once 01/17/21 0526 01/17/21 1118   01/17/21 0515  cefTRIAXone (ROCEPHIN) 1 g in sodium chloride 0.9 % 100 mL IVPB        1 g 200 mL/hr over 30 Minutes Intravenous  Once 01/17/21 0501 01/17/21 0658      Culture/Microbiology    Component Value Date/Time   SDES BLOOD RIGHT HAND 01/17/2021 0545   SPECREQUEST  01/17/2021 0545    BOTTLES DRAWN AEROBIC ONLY Blood Culture adequate volume   CULT  01/17/2021 0545    NO GROWTH 1 DAY Performed at [facility], [place] [address] [place],  [address]    REPTSTATUS PENDING 01/17/2021 0545    Other culture-see note  Objective: Vitals: Today's Vitals   01/18/21 1940 01/18/21 2014 01/18/21 2333 01/19/21 0312  BP:  (!) 117/58 112/69 (!) 116/47  Pulse:  71 74 74  Resp:  (!) 23 (!) 22 (!) 24  Temp:  98.4 F (36.9 C) 98 F (36.7 C) 98.7 F (37.1 C)  TempSrc:  Oral Oral Axillary  SpO2:  98% 94% 92%  Weight:      Height:      PainSc: 0-No pain  0-No pain     Intake/Output Summary (Last 24 hours) at 01/19/2021 0808 Last data filed at 01/19/2021 0600 Gross per 24 hour  Intake 901.43 ml  Output 552 ml  Net 349.43 ml    Filed Weights   01/17/21 0459  Weight: 105 kg   Weight change:   Intake/Output from previous day: 09/12 0701 - 09/13 0700 In: 1049.9 [P.O.:480; I.V.:475.1; IV Piggyback:94.7] Out: 552  [Urine:552] Intake/Output this shift: No intake/output data recorded. Filed Weights   01/17/21 0459  Weight: 105 kg   Examination: General exam: AAO, obese, pleasant,weak appearing. HEENT:Oral mucosa moist, Ear/Nose WNL grossly, dentition normal. Respiratory system: bilaterally clear breath sounds, no use of accessory muscle Cardiovascular system: S1 & S2 +, No JVD,. Gastrointestinal system: Abdomen soft, NT,ND, BS+ Nervous System:Alert, awake, moving extremities and grossly nonfocal Extremities: Bilateral edematous lower extremities with mild redness worse on the right than left but overall much improved. Skin: No rashes,no icterus. MSK: Normal muscle bulk,tone, power .  Data Reviewed: I have personally reviewed following labs and imaging studies CBC: Recent Labs  Lab 01/16/21 1633 01/18/21 0034 01/19/21 0041  WBC 15.7* 11.4* 8.9  NEUTROABS 14.2*  --   --   HGB 8.6* 7.6* 7.1*  HCT 25.4* 22.1* 20.8*  MCV 94.4 91.7 92.0  PLT 238 188 678    Basic Metabolic Panel: Recent Labs  Lab 01/16/21 1633 01/17/21 1300 01/18/21 0034 01/19/21 0041  NA 129* 130* 129* 129*  K 3.5 3.4* 3.5 3.6  CL 94* 98 99 103  CO2 20* 18* 19* 15*  GLUCOSE 112* 101* 113* 102*  BUN 53* 57* 57* 59*  CREATININE 4.29* 4.29* 4.17* 3.91*  CALCIUM 9.0 8.3* 8.2* 8.1*  PHOS  --   --  3.1  --     GFR: Estimated Creatinine Clearance: 19.2 mL/min (A) (by C-G formula based on SCr of 3.91 mg/dL (H)). Liver Function Tests: Recent Labs  Lab 01/16/21 1633 01/18/21 0034  AST 23  --   ALT 14  --   ALKPHOS 72  --   BILITOT 1.0  --   PROT 6.2*  --   ALBUMIN 3.0* 2.2*    No results for input(s): LIPASE, AMYLASE in the last 168 hours. No results for input(s): AMMONIA in the last 168 hours. Coagulation Profile: Recent  Labs  Lab 01/16/21 1633  INR 1.2    Cardiac Enzymes: No results for input(s): CKTOTAL, CKMB, CKMBINDEX, TROPONINI in the last 168 hours. BNP (last 3 results) No results for  input(s): PROBNP in the last 8760 hours. HbA1C: No results for input(s): HGBA1C in the last 72 hours. CBG: No results for input(s): GLUCAP in the last 168 hours. Lipid Profile: No results for input(s): CHOL, HDL, LDLCALC, TRIG, CHOLHDL, LDLDIRECT in the last 72 hours. Thyroid Function Tests: No results for input(s): TSH, T4TOTAL, FREET4, T3FREE, THYROIDAB in the last 72 hours. Anemia Panel: No results for input(s): VITAMINB12, FOLATE, FERRITIN, TIBC, IRON, RETICCTPCT in the last 72 hours. Sepsis Labs: Recent Labs  Lab 01/17/21 0601  LATICACIDVEN 1.0     Recent Results (from the past 240 hour(s))  Urine Culture     Status: Abnormal   Collection Time: 01/16/21  4:27 AM   Specimen: Urine, Clean Catch  Result Value Ref Range Status   Specimen Description URINE, CLEAN CATCH  Final   Special Requests   Final    NONE Performed at [facility], [address], [place], [place] [address]    Culture >=100,000 COLONIES/mL PROTEUS MIRABILIS (A)  Final   Report Status 01/19/2021 FINAL  Final   Organism ID, Bacteria PROTEUS MIRABILIS (A)  Final      Susceptibility   Proteus mirabilis - MIC*    AMPICILLIN <=2 SENSITIVE Sensitive     CEFAZOLIN <=4 SENSITIVE Sensitive     CEFEPIME <=0.12 SENSITIVE Sensitive     CEFTRIAXONE <=0.25 SENSITIVE Sensitive     CIPROFLOXACIN >=4 RESISTANT Resistant     GENTAMICIN 8 INTERMEDIATE Intermediate     IMIPENEM 2 SENSITIVE Sensitive     NITROFURANTOIN 128 RESISTANT Resistant     TRIMETH/SULFA >=320 RESISTANT Resistant     AMPICILLIN/SULBACTAM <=2 SENSITIVE Sensitive     PIP/TAZO <=4 SENSITIVE Sensitive     * >=100,000 COLONIES/mL PROTEUS MIRABILIS  Resp Panel by RT-PCR (Flu A&B, Covid)     Status: None   Collection Time: 01/17/21  3:52 AM   Specimen: Nasopharyngeal(NP) swabs in vial transport medium  Result Value Ref Range Status   SARS Coronavirus 2 by RT PCR NEGATIVE NEGATIVE Final    Comment: (NOTE) SARS-CoV-2 target nucleic acids are NOT  DETECTED.  The SARS-CoV-2 RNA is generally detectable in upper respiratory specimens during the acute phase of infection. The lowest concentration of SARS-CoV-2 viral copies this assay can detect is 138 copies/mL. A negative result does not preclude SARS-Cov-2 infection and should not be used as the sole basis for treatment or other patient management decisions. A negative result may occur with  improper specimen collection/handling, submission of specimen other than nasopharyngeal swab, presence of viral mutation(s) within the areas targeted by this assay, and inadequate number of viral copies(<138 copies/mL). A negative result must be combined with clinical observations, patient history, and epidemiological information. The expected result is Negative.  Fact Sheet for Patients:  EntrepreneurPulse.com.au  Fact Sheet for Healthcare Providers:  IncredibleEmployment.be  This test is no t yet approved or cleared by the [place] FDA and  has been authorized for detection and/or diagnosis of SARS-CoV-2 by FDA under an Emergency Use Authorization (EUA). This EUA will remain  in effect (meaning this test can be used) for the duration of the COVID-19 declaration under Section 564(b)(1) of the Act, 21 U.S.C.section 360bbb-3(b)(1), unless the authorization is terminated  or revoked sooner.       Influenza A  by PCR NEGATIVE NEGATIVE Final   Influenza B by PCR NEGATIVE NEGATIVE Final    Comment: (NOTE) The Xpert Xpress SARS-CoV-2/FLU/RSV plus assay is intended as an aid in the diagnosis of influenza from Nasopharyngeal swab specimens and should not be used as a sole basis for treatment. Nasal washings and aspirates are unacceptable for Xpert Xpress SARS-CoV-2/FLU/RSV testing.  Fact Sheet for Patients: EntrepreneurPulse.com.au  Fact Sheet for Healthcare Providers: IncredibleEmployment.be  This test is not yet  approved or cleared by the [place] FDA and has been authorized for detection and/or diagnosis of SARS-CoV-2 by FDA under an Emergency Use Authorization (EUA). This EUA will remain in effect (meaning this test can be used) for the duration of the COVID-19 declaration under Section 564(b)(1) of the Act, 21 U.S.C. section 360bbb-3(b)(1), unless the authorization is terminated or revoked.  Performed at [facility], [place] [address], [place], [place] [address]   Blood culture (routine x 2)     Status: None (Preliminary result)   Collection Time: 01/17/21  5:40 AM   Specimen: BLOOD RIGHT ARM  Result Value Ref Range Status   Specimen Description BLOOD RIGHT ARM  Final   Special Requests   Final    BOTTLES DRAWN AEROBIC AND ANAEROBIC Blood Culture adequate volume   Culture   Final    NO GROWTH 1 DAY Performed at [facility], [place] [address] [place], [place] [address]    Report Status PENDING  Incomplete  Blood culture (routine x 2)     Status: None (Preliminary result)   Collection Time: 01/17/21  5:45 AM   Specimen: BLOOD RIGHT HAND  Result Value Ref Range Status   Specimen Description BLOOD RIGHT HAND  Final   Special Requests   Final    BOTTLES DRAWN AEROBIC ONLY Blood Culture adequate volume   Culture   Final    NO GROWTH 1 DAY Performed at [facility], [place] [address] [place],  [address]    Report Status PENDING  Incomplete      Radiology Studies: ECHOCARDIOGRAM COMPLETE  Result Date: 01/17/2021    ECHOCARDIOGRAM REPORT   Patient Name:   YORDAN MARTINDALE Date of Exam: 01/17/2021 Medical Rec #:  [id number]        Height:       72.0 in Accession #:    [id number]       Weight:       231.5 lb Date of Birth:  02/10/1941        BSA:          2.267 m Patient Age:    7 years         BP:           140/61 mmHg Patient Gender: M                HR:           85 bpm. Exam Location:  Inpatient Procedure: 2D Echo, Cardiac Doppler and Color Doppler Indications:     Elevated Troponin  History:        Patient has prior history of Echocardiogram examinations, most                 recent 09/08/2020. Risk Factors:Hypertension.  Sonographer:    Tawnya Crook Referring Phys: [id number] [place] IMPRESSIONS  1. Left ventricular ejection fraction, by estimation, is 60 to 65%. The left ventricle has normal function. The left ventricle has no regional wall motion  abnormalities. There is mild concentric left ventricular hypertrophy. Left ventricular diastolic function could not be evaluated.  2. Right ventricular systolic function is normal. The right ventricular size is normal. There is normal pulmonary artery systolic pressure.  3. Left atrial size was severely dilated.  4. The mitral valve is normal in structure. Trivial mitral valve regurgitation. No evidence of mitral stenosis.  5. The aortic valve is tricuspid. There is mild calcification of the aortic valve. There is mild thickening of the aortic valve. Aortic valve regurgitation is not visualized. Mild aortic valve stenosis. Aortic valve area, by VTI measures 3.20 cm. Aortic valve mean gradient measures 12.0 mmHg. Aortic valve Vmax measures 2.35 m/s.  6. Aortic dilatation noted. There is mild dilatation of the aortic root, measuring 40 mm.  7. The inferior vena cava is dilated in size with >50% respiratory variability, suggesting right atrial pressure of 8 mmHg. FINDINGS  Left Ventricle: Left ventricular ejection fraction, by estimation, is 60 to 65%. The left ventricle has normal function. The left ventricle has no regional wall motion abnormalities. The left ventricular internal cavity size was normal in size. There is  mild concentric left ventricular hypertrophy. Left ventricular diastolic function could not be evaluated. Right Ventricle: The right ventricular size is normal. No increase in right ventricular wall thickness. Right ventricular systolic function is normal. There is normal pulmonary artery systolic pressure. The  tricuspid regurgitant velocity is 1.57 m/s, and  with an assumed right atrial pressure of 8 mmHg, the estimated right ventricular systolic pressure is 22.2 mmHg. Left Atrium: Left atrial size was severely dilated. Right Atrium: Right atrial size was normal in size. Pericardium: There is no evidence of pericardial effusion. Mitral Valve: The mitral valve is normal in structure. Trivial mitral valve regurgitation. No evidence of mitral valve stenosis. Tricuspid Valve: The tricuspid valve is normal in structure. Tricuspid valve regurgitation is not demonstrated. No evidence of tricuspid stenosis. Aortic Valve: The aortic valve is tricuspid. There is mild calcification of the aortic valve. There is mild thickening of the aortic valve. Aortic valve regurgitation is not visualized. Mild aortic stenosis is present. Aortic valve mean gradient measures  12.0 mmHg. Aortic valve peak gradient measures 22.1 mmHg. Aortic valve area, by VTI measures 3.20 cm. Pulmonic Valve: The pulmonic valve was normal in structure. Pulmonic valve regurgitation is trivial. No evidence of pulmonic stenosis. Aorta: Aortic dilatation noted. There is mild dilatation of the aortic root, measuring 40 mm. Venous: The inferior vena cava is dilated in size with greater than 50% respiratory variability, suggesting right atrial pressure of 8 mmHg. IAS/Shunts: No atrial level shunt detected by color flow Doppler.  LEFT VENTRICLE PLAX 2D LVIDd:         5.30 cm     Diastology LVIDs:         3.50 cm     LV e' medial:  9.79 cm/s LV PW:         1.05 cm     LV e' lateral: 8.16 cm/s LV IVS:        1.30 cm LVOT diam:     2.30 cm LV SV:         160 LV SV Index:   71 LVOT Area:     4.15 cm  LV Volumes (MOD) LV vol d, MOD A4C: 71.9 ml LV vol s, MOD A4C: 26.7 ml LV SV MOD A4C:     71.9 ml RIGHT VENTRICLE  IVC RV S prime:     19.40 cm/s  IVC diam: 3.00 cm TAPSE (M-mode): 2.5 cm LEFT ATRIUM             Index       RIGHT ATRIUM           Index LA diam:         4.60 cm 2.03 cm/m  RA Area:     17.20 cm LA Vol (A2C):   86.6 ml 38.20 ml/m RA Volume:   35.30 ml  15.57 ml/m LA Vol (A4C):   91.7 ml 40.45 ml/m LA Biplane Vol: 90.0 ml 39.70 ml/m  AORTIC VALVE                    PULMONIC VALVE AV Area (Vmax):    3.08 cm     PV Vmax:       0.81 m/s AV Area (Vmean):   3.04 cm     PV Peak grad:  2.6 mmHg AV Area (VTI):     3.20 cm AV Vmax:           235.00 cm/s AV Vmean:          164.000 cm/s AV VTI:            0.501 m AV Peak Grad:      22.1 mmHg AV Mean Grad:      12.0 mmHg LVOT Vmax:         174.00 cm/s LVOT Vmean:        120.000 cm/s LVOT VTI:          0.386 m LVOT/AV VTI ratio: 0.77  AORTA Ao Root diam: 4.00 cm Ao Asc diam:  3.00 cm TRICUSPID VALVE TR Peak grad:   9.9 mmHg TR Vmax:        157.00 cm/s  SHUNTS Systemic VTI:  0.39 m Systemic Diam: 2.30 cm Skeet Latch MD Electronically signed by Skeet Latch MD Signature Date/Time: 01/17/2021/3:04:02 PM    Final      LOS: 2 days   Antonieta Pert, MD [facility]  01/19/2021, 8:08 AM

## 2021-01-19 NOTE — Progress Notes (Signed)
Physical Therapy Treatment Patient Details Name: Darryl Diaz MRN: [id number] DOB: [birth year] Today's Date: 01/19/2021   History of Present Illness 80 y.o. male presents to [facility] hospital on 01/16/2021 with recurrent falls as well as LE, back, and head pain. Pt admitted for management of LE cellulitis with foreign body removed from R foot, as well as UTI. PMH includes PAF, chronic diastolic CHF, CKD stage IV, tachybradycardia syndrome, PAD with nonhealing left foot wounds, HLD, HTN, history of GI bleed.    PT Comments    Pt with improved ambulation tolerance this date. Pt continues to have decreased active and passive R ankle DF limiting ability to achieve foot flat on ground. Focused on bilat LE strengthening exercises and passive R ankle stretching into DF. Acute PT to cont to follow.    Recommendations for follow up therapy are one component of a multi-disciplinary discharge planning process, led by the attending physician.  Recommendations may be updated based on patient status, additional functional criteria and insurance authorization.  Follow Up Recommendations  SNF     Equipment Recommendations  None recommended by PT    Recommendations for Other Services       Precautions / Restrictions Precautions Precautions: Fall Restrictions Weight Bearing Restrictions: No     Mobility  Bed Mobility               General bed mobility comments: pt up in chair upon PT arrive    Transfers Overall transfer level: Needs assistance Equipment used: Rolling walker (2 wheeled) Transfers: Sit to/from Stand Sit to Stand: Min guard         General transfer comment: verbal cues for hand placement, increased time to power up due to lower surface height and being tall  Ambulation/Gait Ambulation/Gait assistance: Min guard Gait Distance (Feet): 35 Feet Assistive device: Rolling walker (2 wheeled) Gait Pattern/deviations: Step-through pattern;Decreased stride length;Trunk  flexed Gait velocity: dec Gait velocity interpretation: <1.31 ft/sec, indicative of household ambulator General Gait Details: pt slow but steady, c/o SOB, very dependent on UEs   Stairs             Wheelchair Mobility    Modified Rankin (Stroke Patients Only)       Balance Overall balance assessment: Needs assistance Sitting-balance support: Feet supported;No upper extremity supported Sitting balance-Leahy Scale: Fair     Standing balance support: Bilateral upper extremity supported Standing balance-Leahy Scale: Poor Standing balance comment: reliant on BUE support                            Cognition Arousal/Alertness: Awake/alert Behavior During Therapy: WFL for tasks assessed/performed Overall Cognitive Status: Within Functional Limits for tasks assessed                                 General Comments: pt with good safety awareness and looks to PT for advice and instructions      Exercises General Exercises - Lower Extremity Long Arc Quad: AROM;Both;10 reps;Seated (with 3 sec hold at top) Hip ABduction/ADduction: AROM;Both;10 reps;Seated (with resistance into abduction) Hip Flexion/Marching: AROM;Both;10 reps;Seated (with 3 sec hold at top) Other Exercises Other Exercises: passive R heel cord stretch due to inability to achieve neutral R heel DF    General Comments General comments (skin integrity, edema, etc.): SOB with mobilizing otherwise VSS      Pertinent Vitals/Pain Pain Assessment: No/denies pain  Home Living                      Prior Function            PT Goals (current goals can now be found in the care plan section) Acute Rehab PT Goals Patient Stated Goal: go to rehab and regain strength Progress towards PT goals: Progressing toward goals    Frequency    Min 2X/week      PT Plan Current plan remains appropriate    Co-evaluation              AM-PAC PT "6 Clicks" Mobility    Outcome Measure  Help needed turning from your back to your side while in a flat bed without using bedrails?: A Little Help needed moving from lying on your back to sitting on the side of a flat bed without using bedrails?: A Little Help needed moving to and from a bed to a chair (including a wheelchair)?: A Little Help needed standing up from a chair using your arms (e.g., wheelchair or bedside chair)?: A Little Help needed to walk in hospital room?: A Little Help needed climbing 3-5 steps with a railing? : A Lot 6 Click Score: 17    End of Session Equipment Utilized During Treatment: Gait belt Activity Tolerance: Patient limited by fatigue Patient left: in chair;with call bell/phone within reach;with chair alarm set Nurse Communication: Mobility status PT Visit Diagnosis: Unsteadiness on feet (R26.81);Other abnormalities of gait and mobility (R26.89);History of falling (Z91.81);Repeated falls (R29.6);Pain Pain - part of body: Leg;Ankle and joints of foot     Time: 9407-6808 PT Time Calculation (min) (ACUTE ONLY): 31 min  Charges:  $Gait Training: 8-22 mins $Therapeutic Exercise: 8-22 mins                     Kittie Plater, PT, DPT Acute Rehabilitation Services Pager #: [phone] Office #: [phone]    Berline Lopes 01/19/2021, 1:08 PM

## 2021-01-19 NOTE — Consult Note (Signed)
   [facility] Inpatient Consult   01/19/2021  SINCLAIR ALLIGOOD 07-02-1940 [id number]  [facility] [ACO] Patient: Medicare CMS DCE   Patient screened for less than 7 days readmission hospitalization with noted extreme high risk score for unplanned readmission risk.  aPatient with a history with  [facility] team however, declined care management services stating he felt he had services needed at [facility].  Review of patient's medical record reveals patient is being recommended for a skilled nursing facility stay.  Plan:  Continue to follow progress and disposition to assess for post hospital care management needs.  If patient transitions to a [facility] affiliated facility, will alert [facility] RN PAC of potential transitional needs to ILF, if applicable.  For questions contact:   Natividad Brood, RN BSN [facility] Liaison  [phone] business mobile phone Toll free office [phone]  Fax number: [phone] [email or web address]

## 2021-01-19 NOTE — TOC Progression Note (Signed)
Transition of Care [facility]) - Progression Note    Patient Details  Name: Darryl Diaz MRN: [id number] Date of Birth: Aug 29, 1940  Transition of Care [facility]) CM/SW [place], LCSW Phone Number: 01/19/2021, 8:44 AM  Clinical Narrative:    Pasrr # returned and placed on Fl2.   Expected Discharge Plan: [place] Barriers to Discharge: Continued Medical Work up  Expected Discharge Plan and Services Expected Discharge Plan: [place] In-house Referral: Clinical Social Work   Post Acute Care Choice: [place] Living arrangements for the past 2 months: [place]                                       Social Determinants of Health (SDOH) Interventions    Readmission Risk Interventions Readmission Risk Prevention Plan 01/18/2021 03/18/2020 03/05/2020  Transportation Screening Complete Complete Complete  PCP or Specialist Appt within 5-7 Days - - -  Home Care Screening - - -  Medication Review (RN CM) - - -  [place] or [place] - Complete Complete  Social Work Consult for [place] Planning/Counseling - Complete Complete  Palliative Care Screening - Not Applicable Not Applicable  Medication Review Press photographer) Complete Complete Complete  PCP or Specialist appointment within 3-5 days of discharge Complete - -  [place] or Home Care Consult Complete - -  SW Recovery Care/Counseling Consult Complete - -  Palliative Care Screening Not Applicable - -  Skilled Nursing Facility Complete - -  Some recent data might be hidden

## 2021-01-20 ENCOUNTER — Ambulatory Visit: Payer: Medicare Other | Admitting: Podiatry

## 2021-01-20 LAB — CBC
HCT: 23.7 % — ABNORMAL LOW (ref 39.0–52.0)
Hemoglobin: 8 g/dL — ABNORMAL LOW (ref 13.0–17.0)
MCH: 30.8 pg (ref 26.0–34.0)
MCHC: 33.8 g/dL (ref 30.0–36.0)
MCV: 91.2 fL (ref 80.0–100.0)
Platelets: 194 10*3/uL (ref 150–400)
RBC: 2.6 MIL/uL — ABNORMAL LOW (ref 4.22–5.81)
RDW: 13.9 % (ref 11.5–15.5)
WBC: 8.8 10*3/uL (ref 4.0–10.5)
nRBC: 0 % (ref 0.0–0.2)

## 2021-01-20 LAB — RESP PANEL BY RT-PCR (FLU A&B, COVID) ARPGX2
Influenza A by PCR: NEGATIVE
Influenza B by PCR: NEGATIVE
SARS Coronavirus 2 by RT PCR: NEGATIVE

## 2021-01-20 LAB — BASIC METABOLIC PANEL
Anion gap: 9 (ref 5–15)
BUN: 59 mg/dL — ABNORMAL HIGH (ref 8–23)
CO2: 18 mmol/L — ABNORMAL LOW (ref 22–32)
Calcium: 8.4 mg/dL — ABNORMAL LOW (ref 8.9–10.3)
Chloride: 101 mmol/L (ref 98–111)
Creatinine, Ser: 3.67 mg/dL — ABNORMAL HIGH (ref 0.61–1.24)
GFR, Estimated: 16 mL/min — ABNORMAL LOW (ref >60)
Glucose, Bld: 104 mg/dL — ABNORMAL HIGH (ref 70–99)
Potassium: 3.5 mmol/L (ref 3.5–5.1)
Sodium: 128 mmol/L — ABNORMAL LOW (ref 135–145)

## 2021-01-20 MED ORDER — SODIUM BICARBONATE 650 MG PO TABS
650.0000 mg | ORAL_TABLET | Freq: Two times a day (BID) | ORAL | Status: DC
  Administered 2021-01-20 – 2021-01-22 (×5): 650 mg via ORAL
  Filled 2021-01-20 (×5): qty 1

## 2021-01-20 MED ORDER — FUROSEMIDE 10 MG/ML IJ SOLN
20.0000 mg | Freq: Once | INTRAMUSCULAR | Status: AC
  Administered 2021-01-20: 20 mg via INTRAVENOUS
  Filled 2021-01-20: qty 2

## 2021-01-20 NOTE — Progress Notes (Signed)
PROGRESS NOTE    Darryl Diaz  RZN:[id number] DOB: Dec 22, 1940 DOA: 01/16/2021 PCP: [name], MD   Chief Complaint  Patient presents with   Fall    Brief Narrative: 89yom PAF, chronic diastolic CHF, CKD stage IV, tachybradycardia syndrome, PAD with nonhealing left foot wounds, HLD, HTN, history of GI bleed, 1 disorder presented from the facility with recurrent falls about 3 in the past 1 week and was complaining of leg and low back as well as head pain.  Complaining of increasing pain and swelling to his right leg greater than left.  Eliquis when he was in the hospital for GI bleed previously 8/31-9/3   In the ED,Vitals fairly stable, received IV fluid bolus, vancomycin and ceftriaxone, He had foreign body on the right foot that was removed. Imaging:chest x-ray lower lung volumes with patchy and barely bibasilar opacity diffuse pulmonary vascular congestion, suspected interstitial edema with possible small pleural effusion new from last year but unchanged compared to May, chronic right rib fractures. X-ray lumbar spine diffuse severe spine degeneration, CT head no acute intracranial findings severe brain parenchymal atrophy with chronic microvascular disease. CT cervical spine multilevel spondylosis and facet atrophy. X-ray foot right soft tissue swelling. Labs showed abnormal UA with pyuria, leukocytosis 15.7 BNP 315, troponin 31, CMP hyponatremia 129 potassium 3.5 BUN/creatinine 53/4.2 recent baseline 2.8-3.4, blood cultures were sent. Patient was admitted for cellulitis  rt> left in the setting of chronic leg swelling. 9/13 hemoglobin decreased to 7.1 given 1 unit PRBC  Subjective: Resting comfortably.  No complaint Overnight hemoglobin stabilized.  No fever.   Renal function improving  Assessment & Plan:  Sepsis due to cellulitis POA Cellulitis right lower leg more than left with foreign body: met sepsis criteria with heart rate >90, leukocytosis 15.7K,Foreign body  removed from foot.  On vancomycin and cefepime>changed to ceftriaxone/vancomycin 9.13- CT showed severe diffuse soft tissue swelling, no acute osseous abnormalities.  Leukocytosis nicely improved.  Blood culture no growth so far .  I was able to express pus from his right foot wound we will send it for culture.  I think he would benefit with diuresis given his significant leg edema.Continue compression stocking and elevate the leg. Recent Labs  Lab 01/16/21 1633 01/17/21 0601 01/18/21 0034 01/19/21 0041 01/20/21 0103  WBC 15.7*  --  11.4* 8.9 8.8  LATICACIDVEN  --  1.0  --   --   --      Acute lower UTI 2/2 Proteus mirabilis-Sensitive to ceftriaxone antibiotics.  Above  AKI on CKD stage IV with metabolic acidosis BUN/creatinine 53/4.2 recent baseline 2.8-3.4, home torsemide held, was placed on gentle IV fluids- sp lasix 9/13 with with blood transfusion - BUN/creatinine continues to downtrend slowly-. We will dose with iv lasix today and hold ivf.  Start on oral bicarb. Recent Labs  Lab 01/16/21 1633 01/17/21 1300 01/18/21 0034 01/19/21 0041 01/20/21 0103  BUN 53* 57* 57* 59* 59*  CREATININE 4.29* 4.29* 4.17* 3.91* 3.67*   Net IO Since Admission: 2,789.45 mL [01/20/21 1106]   Intake/Output Summary (Last 24 hours) at 01/20/2021 1106 Last data filed at 01/20/2021 0851 Gross per 24 hour  Intake 340 ml  Output 1000 ml  Net -660 ml     Hyponatremia:on ivf, holding diuretics.  Sodium remains the same.  Urine sodium elevated 77 Osm 306. Recent Labs  Lab 01/16/21 1633 01/17/21 1300 01/18/21 0034 01/19/21 0041 01/20/21 0103  NA 129* 130* 129* 129* 128*    HLD Chronic diastolic  CHF bnp 351, with leg edema. Creat better- will do lasix x1 and resume torsemide hopefully in am,   HTN: BP well controlled, on Coreg.  Also on hydralazine but was not taking at home it seems-so we discontinued and his blood pressure is stable no hypotension   GERD-continue PPI  PAF on amiodarone and  Coreg ,Eliquis discontinued recently for GI bleed  CAD on statin and beta-blocker Positive troponin 31> 69> 31, transiently high without chest pain.  EKG shows A. fib no acute ischemic changes-suspect demand ischemia due to patient's infection/chf-obtained echo to eval LVEF/wall motion-no regional wall motion abnormalities.  Stable.  Anemia of chronic disease/ chronic blood loss: S/p 1 unit PRBC and hemoglobin is stable.  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  Diet Order             Diet Heart Room service appropriate? Yes; Fluid consistency: Thin  Diet effective now                   Patient's Body mass index is 31.39 kg/m. DVT prophylaxis: SCDs Start: 01/17/21 0618 Code Status:   Code Status: DNR  Family Communication: plan of care discussed with patient at bedside. Status is: Inpatient  Remains inpatient appropriate because:IV treatments appropriate due to intensity of illness or inability to take PO and Inpatient level of care appropriate due to severity of illness  Dispo: The patient is from: Home              Anticipated d/c is to: SNF hopefully next 1 to 2 days if renal function is stable              Patient currently is not medically stable to d/c.   Difficult to place patient No  Unresulted Labs (From admission, onward)     Start     Ordered   01/19/21 3267  Basic metabolic panel  Daily,   R     Question:  Specimen collection method  Answer:  Lab=Lab collect   01/18/21 1202   01/19/21 0500  CBC  Daily,   R     Question:  Specimen collection method  Answer:  Lab=Lab collect   01/18/21 1202           Medications reviewed:  Scheduled Meds:  amiodarone  200 mg Oral Q lunch   atorvastatin  20 mg Oral Q lunch   carvedilol  6.25 mg Oral BID WC   escitalopram   10 mg Oral Q lunch   ferrous sulfate  325 mg Oral Q lunch   folic acid  1 mg Oral Q lunch   magnesium oxide  400 mg Oral Q lunch   nystatin   Topical BID   pantoprazole  40 mg Oral Q lunch   Continuous Infusions:  sodium chloride Stopped (01/19/21 1600)   cefTRIAXone (ROCEPHIN)  IV 1 g (01/20/21 1245)   vancomycin Stopped (01/19/21 1720)   Consultants:see note  Procedures:see note Antimicrobials: Anti-infectives (From admission, onward)    Start     Dose/Rate Route Frequency Ordered Stop   01/20/21 0600  cefTRIAXone (ROCEPHIN) 1 g in sodium chloride 0.9 % 100 mL IVPB        1 g 200 mL/hr over 30 Minutes Intravenous Every 24 hours  01/19/21 0845 01/24/21 0559   01/19/21 0800  vancomycin (VANCOREADY) IVPB 1500 mg/300 mL        1,500 mg 150 mL/hr over 120 Minutes Intravenous Every 48 hours 01/17/21 0531     01/17/21 0700  ceFEPIme (MAXIPIME) 2 g in sodium chloride 0.9 % 100 mL IVPB  Status:  Discontinued        2 g 200 mL/hr over 30 Minutes Intravenous Every 24 hours 01/17/21 0623 01/19/21 0845   01/17/21 0630  vancomycin (VANCOCIN) IVPB 1000 mg/200 mL premix  Status:  Discontinued        1,000 mg 200 mL/hr over 60 Minutes Intravenous  Once 01/17/21 0623 01/17/21 0624   01/17/21 0530  vancomycin (VANCOREADY) IVPB 2000 mg/400 mL        2,000 mg 200 mL/hr over 120 Minutes Intravenous  Once 01/17/21 0526 01/17/21 1118   01/17/21 0515  cefTRIAXone (ROCEPHIN) 1 g in sodium chloride 0.9 % 100 mL IVPB        1 g 200 mL/hr over 30 Minutes Intravenous  Once 01/17/21 0501 01/17/21 0658      Culture/Microbiology    Component Value Date/Time   SDES BLOOD RIGHT HAND 01/17/2021 0545   SPECREQUEST  01/17/2021 0545    BOTTLES DRAWN AEROBIC ONLY Blood Culture adequate volume   CULT  01/17/2021 0545    NO GROWTH 2 DAYS Performed at [facility], [place] [address] [place], [place] [address]    REPTSTATUS PENDING 01/17/2021 0545    Other culture-see note  Objective: Vitals: Today's  Vitals   01/19/21 1954 01/20/21 0056 01/20/21 0153 01/20/21 0400  BP: 125/60 (!) 144/66  139/73  Pulse: 67 67  66  Resp: (!) 24 (!) 23  (!) 23  Temp: 98.7 F (37.1 C) 98.4 F (36.9 C)  99 F (37.2 C)  TempSrc: Oral Oral  Axillary  SpO2: 96% 95%  96%  Weight:      Height:      PainSc:   Asleep     Intake/Output Summary (Last 24 hours) at 01/20/2021 0815 Last data filed at 01/20/2021 8841 Gross per 24 hour  Intake 1464.09 ml  Output 1250 ml  Net 214.09 ml    Filed Weights   01/17/21 0459  Weight: 105 kg   Weight change:   Intake/Output from previous day: 09/13 0701 - 09/14 0700 In: 1464.1 [I.V.:1364.1; IV Piggyback:100] Out: 1250 [Urine:1250] Intake/Output this shift: No intake/output data recorded. Filed Weights   01/17/21 0459  Weight: 105 kg   Examination: General exam: AAO at baseline, pleasantly confused, not in distress elderly HEENT:Oral mucosa moist, Ear/Nose WNL grossly, dentition normal. Respiratory system: bilaterally diminished, no use of accessory muscle Cardiovascular system: S1 & S2 +, No JVD,. Gastrointestinal system: Abdomen soft,NT,ND, BS+ Nervous System:Alert, awake, moving extremities and grossly nonfocal Extremities: Lower leg edema with improving redness, right foot with pus draining from the dorsum Skin: No rashes,no icterus. MSK: Normal muscle bulk,tone, power .   PIC: On admissIon date   PIC: 01/20/21      Data Reviewed: I have personally reviewed following labs and imaging studies CBC: Recent Labs  Lab 01/16/21 1633 01/18/21 0034 01/19/21 0041 01/19/21 1702 01/20/21 0103  WBC 15.7* 11.4* 8.9  --  8.8  NEUTROABS 14.2*  --   --   --   --   HGB 8.6* 7.6* 7.1* 8.0* 8.0*  HCT 25.4* 22.1* 20.8* 23.9* 23.7*  MCV 94.4 91.7 92.0  --  91.2  PLT 238  188 185  --  166    Basic Metabolic Panel: Recent Labs  Lab 01/16/21 1633 01/17/21 1300 01/18/21 0034 01/19/21 0041 01/20/21 0103  NA 129* 130* 129* 129* 128*  K 3.5 3.4* 3.5  3.6 3.5  CL 94* 98 99 103 101  CO2 20* 18* 19* 15* 18*  GLUCOSE 112* 101* 113* 102* 104*  BUN 53* 57* 57* 59* 59*  CREATININE 4.29* 4.29* 4.17* 3.91* 3.67*  CALCIUM 9.0 8.3* 8.2* 8.1* 8.4*  PHOS  --   --  3.1  --   --     GFR: Estimated Creatinine Clearance: 20.5 mL/min (A) (by C-G formula based on SCr of 3.67 mg/dL (H)). Liver Function Tests: Recent Labs  Lab 01/16/21 1633 01/18/21 0034  AST 23  --   ALT 14  --   ALKPHOS 72  --   BILITOT 1.0  --   PROT 6.2*  --   ALBUMIN 3.0* 2.2*    No results for input(s): LIPASE, AMYLASE in the last 168 hours. No results for input(s): AMMONIA in the last 168 hours. Coagulation Profile: Recent Labs  Lab 01/16/21 1633  INR 1.2    Cardiac Enzymes: No results for input(s): CKTOTAL, CKMB, CKMBINDEX, TROPONINI in the last 168 hours. BNP (last 3 results) No results for input(s): PROBNP in the last 8760 hours. HbA1C: No results for input(s): HGBA1C in the last 72 hours. CBG: No results for input(s): GLUCAP in the last 168 hours. Lipid Profile: No results for input(s): CHOL, HDL, LDLCALC, TRIG, CHOLHDL, LDLDIRECT in the last 72 hours. Thyroid Function Tests: No results for input(s): TSH, T4TOTAL, FREET4, T3FREE, THYROIDAB in the last 72 hours. Anemia Panel: No results for input(s): VITAMINB12, FOLATE, FERRITIN, TIBC, IRON, RETICCTPCT in the last 72 hours. Sepsis Labs: Recent Labs  Lab 01/17/21 0601  LATICACIDVEN 1.0     Recent Results (from the past 240 hour(s))  Urine Culture     Status: Abnormal   Collection Time: 01/16/21  4:27 AM   Specimen: Urine, Clean Catch  Result Value Ref Range Status   Specimen Description URINE, CLEAN CATCH  Final   Special Requests   Final    NONE Performed at [facility], [address], [place], [place] [address]    Culture >=100,000 COLONIES/mL PROTEUS MIRABILIS (A)  Final   Report Status 01/19/2021 FINAL  Final   Organism ID, Bacteria PROTEUS MIRABILIS (A)  Final       Susceptibility   Proteus mirabilis - MIC*    AMPICILLIN <=2 SENSITIVE Sensitive     CEFAZOLIN <=4 SENSITIVE Sensitive     CEFEPIME <=0.12 SENSITIVE Sensitive     CEFTRIAXONE <=0.25 SENSITIVE Sensitive     CIPROFLOXACIN >=4 RESISTANT Resistant     GENTAMICIN 8 INTERMEDIATE Intermediate     IMIPENEM 2 SENSITIVE Sensitive     NITROFURANTOIN 128 RESISTANT Resistant     TRIMETH/SULFA >=320 RESISTANT Resistant     AMPICILLIN/SULBACTAM <=2 SENSITIVE Sensitive     PIP/TAZO <=4 SENSITIVE Sensitive     * >=100,000 COLONIES/mL PROTEUS MIRABILIS  Resp Panel by RT-PCR (Flu A&B, Covid)     Status: None   Collection Time: 01/17/21  3:52 AM   Specimen: Nasopharyngeal(NP) swabs in vial transport medium  Result Value Ref Range Status   SARS Coronavirus 2 by RT PCR NEGATIVE NEGATIVE Final    Comment: (NOTE) SARS-CoV-2 target nucleic acids are NOT DETECTED.  The SARS-CoV-2 RNA is generally detectable in upper respiratory specimens during the acute phase  of infection. The lowest concentration of SARS-CoV-2 viral copies this assay can detect is 138 copies/mL. A negative result does not preclude SARS-Cov-2 infection and should not be used as the sole basis for treatment or other patient management decisions. A negative result may occur with  improper specimen collection/handling, submission of specimen other than nasopharyngeal swab, presence of viral mutation(s) within the areas targeted by this assay, and inadequate number of viral copies(<138 copies/mL). A negative result must be combined with clinical observations, patient history, and epidemiological information. The expected result is Negative.  Fact Sheet for Patients:  EntrepreneurPulse.com.au  Fact Sheet for Healthcare Providers:  IncredibleEmployment.be  This test is no t yet approved or cleared by the [place] FDA and  has been authorized for detection and/or diagnosis of SARS-CoV-2 by FDA under  an Emergency Use Authorization (EUA). This EUA will remain  in effect (meaning this test can be used) for the duration of the COVID-19 declaration under Section 564(b)(1) of the Act, 21 U.S.C.section 360bbb-3(b)(1), unless the authorization is terminated  or revoked sooner.       Influenza A by PCR NEGATIVE NEGATIVE Final   Influenza B by PCR NEGATIVE NEGATIVE Final    Comment: (NOTE) The Xpert Xpress SARS-CoV-2/FLU/RSV plus assay is intended as an aid in the diagnosis of influenza from Nasopharyngeal swab specimens and should not be used as a sole basis for treatment. Nasal washings and aspirates are unacceptable for Xpert Xpress SARS-CoV-2/FLU/RSV testing.  Fact Sheet for Patients: EntrepreneurPulse.com.au  Fact Sheet for Healthcare Providers: IncredibleEmployment.be  This test is not yet approved or cleared by the [place] FDA and has been authorized for detection and/or diagnosis of SARS-CoV-2 by FDA under an Emergency Use Authorization (EUA). This EUA will remain in effect (meaning this test can be used) for the duration of the COVID-19 declaration under Section 564(b)(1) of the Act, 21 U.S.C. section 360bbb-3(b)(1), unless the authorization is terminated or revoked.  Performed at [facility], [place] [address] [place], [place] [address]   Blood culture (routine x 2)     Status: None (Preliminary result)   Collection Time: 01/17/21  5:40 AM   Specimen: BLOOD RIGHT ARM  Result Value Ref Range Status   Specimen Description BLOOD RIGHT ARM  Final   Special Requests   Final    BOTTLES DRAWN AEROBIC AND ANAEROBIC Blood Culture adequate volume   Culture   Final    NO GROWTH 2 DAYS Performed at [facility], [place] [address], [place], [place] [address]    Report Status PENDING  Incomplete  Blood culture (routine x 2)     Status: None (Preliminary result)   Collection Time: 01/17/21  5:45 AM   Specimen: BLOOD RIGHT HAND   Result Value Ref Range Status   Specimen Description BLOOD RIGHT HAND  Final   Special Requests   Final    BOTTLES DRAWN AEROBIC ONLY Blood Culture adequate volume   Culture   Final    NO GROWTH 2 DAYS Performed at [facility], [place] [address] [place], [place] [address]    Report Status PENDING  Incomplete      Radiology Studies: No results found.   LOS: 3 days   Antonieta Pert, MD [facility]  01/20/2021, 8:15 AM

## 2021-01-20 NOTE — Care Management Important Message (Signed)
Important Message  Patient Details  Name: Darryl Diaz MRN: [id number] Date of Birth: 1940/11/15   Medicare Important Message Given:  Yes     Orbie Pyo 01/20/2021, 4:25 PM

## 2021-01-21 LAB — BASIC METABOLIC PANEL
Anion gap: 8 (ref 5–15)
BUN: 58 mg/dL — ABNORMAL HIGH (ref 8–23)
CO2: 20 mmol/L — ABNORMAL LOW (ref 22–32)
Calcium: 8.4 mg/dL — ABNORMAL LOW (ref 8.9–10.3)
Chloride: 102 mmol/L (ref 98–111)
Creatinine, Ser: 3.46 mg/dL — ABNORMAL HIGH (ref 0.61–1.24)
GFR, Estimated: 17 mL/min — ABNORMAL LOW (ref >60)
Glucose, Bld: 101 mg/dL — ABNORMAL HIGH (ref 70–99)
Potassium: 3.5 mmol/L (ref 3.5–5.1)
Sodium: 130 mmol/L — ABNORMAL LOW (ref 135–145)

## 2021-01-21 LAB — CBC
HCT: 23.7 % — ABNORMAL LOW (ref 39.0–52.0)
Hemoglobin: 8.2 g/dL — ABNORMAL LOW (ref 13.0–17.0)
MCH: 31.1 pg (ref 26.0–34.0)
MCHC: 34.6 g/dL (ref 30.0–36.0)
MCV: 89.8 fL (ref 80.0–100.0)
Platelets: 205 10*3/uL (ref 150–400)
RBC: 2.64 MIL/uL — ABNORMAL LOW (ref 4.22–5.81)
RDW: 13.7 % (ref 11.5–15.5)
WBC: 6.4 10*3/uL (ref 4.0–10.5)
nRBC: 0 % (ref 0.0–0.2)

## 2021-01-21 LAB — BPAM RBC
Blood Product Expiration Date: 202210112359
ISSUE DATE / TIME: 202209131118
Unit Type and Rh: 5100

## 2021-01-21 LAB — TYPE AND SCREEN
ABO/RH(D): O POS
Antibody Screen: NEGATIVE
Unit division: 0

## 2021-01-21 MED ORDER — TORSEMIDE 20 MG PO TABS
40.0000 mg | ORAL_TABLET | Freq: Every day | ORAL | Status: DC
  Administered 2021-01-21 – 2021-01-22 (×2): 40 mg via ORAL
  Filled 2021-01-21 (×2): qty 2

## 2021-01-21 NOTE — Progress Notes (Signed)
PROGRESS NOTE    Darryl Diaz  HYW:[id number] DOB: March 22, 1941 DOA: 01/16/2021 PCP: [name], MD   Chief Complaint  Patient presents with   Fall    Brief Narrative: 71yom PAF, chronic diastolic CHF, CKD stage IV, tachybradycardia syndrome, PAD with nonhealing left foot wounds, HLD, HTN, history of GI bleed, 1 disorder presented from the facility with recurrent falls about 3 in the past 1 week and was complaining of leg and low back as well as head pain.  Complaining of increasing pain and swelling to his right leg greater than left.  Eliquis when he was in the hospital for GI bleed previously 8/31-9/3   In the ED,Vitals fairly stable, received IV fluid bolus, vancomycin and ceftriaxone, He had foreign body on the right foot that was removed. Imaging:chest x-ray lower lung volumes with patchy and barely bibasilar opacity diffuse pulmonary vascular congestion, suspected interstitial edema with possible small pleural effusion new from last year but unchanged compared to May, chronic right rib fractures. X-ray lumbar spine diffuse severe spine degeneration, CT head no acute intracranial findings severe brain parenchymal atrophy with chronic microvascular disease. CT cervical spine multilevel spondylosis and facet atrophy. X-ray foot right soft tissue swelling. Labs showed abnormal UA with pyuria, leukocytosis 15.7 BNP 315, troponin 31, CMP hyponatremia 129 potassium 3.5 BUN/creatinine 53/4.2 recent baseline 2.8-3.4, blood cultures were sent. Patient was admitted for cellulitis  rt> left in the setting of chronic leg swelling. 9/13 hemoglobin decreased to 7.1 given 1 unit PRBC-with improvement in hemoglobin Edema redness improving-had a pus expressed from the site of foreign body removal from the dorsum of the right foot and sent for wound culture  Subjective: Seen and examined this morning.  No complaints.  No leg edema and redness improving No acute events overnight.    Assessment  & Plan:  Sepsis due to cellulitis POA Staph cellulitis right lower leg more than left with foreign body: met sepsis criteria with heart rate >90, leukocytosis 15.7K,Foreign body removed from foot.  On vancomycin and cefepime>changed to ceftriaxone/vancomycin 9.13- CT showed severe diffuse soft tissue swelling, no acute osseous abnormalities.  Leukocytosis nicely improved.  Blood culture no growth so far .  I was able to express pus from his right foot wound 9/14- sent for culture-growing Staph aureus-continue current vancomycin.  Continue on diuresis given significant lower leg edema, as renal functions stable/improving and tolerating.Continue compression stocking and elevate the leg. Recent Labs  Lab 01/16/21 1633 01/17/21 0601 01/18/21 0034 01/19/21 0041 01/20/21 0103 01/21/21 0136  WBC 15.7*  --  11.4* 8.9 8.8 6.4  LATICACIDVEN  --  1.0  --   --   --   --     Acute lower UTI 2/2 Proteus mirabilis-Sensitive to ceftriaxone antibiotics.  Continue same above  AKI on CKD stage IV with metabolic acidosis BUN/creatinine 53/4.2 recent baseline 2.8-3.4, home torsemide held, was placed on gentle IV fluids- sp lasix 9/13 with blood transfusion - BUN/creatinine continues to downtrend slowly, IV fluids discontinued placed on IV Lasix, oral bicarb and renal functions stable improving. Recent Labs  Lab 01/17/21 1300 01/18/21 0034 01/19/21 0041 01/20/21 0103 01/21/21 0136  BUN 57* 57* 59* 59* 58*  CREATININE 4.29* 4.17* 3.91* 3.67* 3.46*  Net IO Since Admission: 1,911.85 mL [01/21/21 1315]   Intake/Output Summary (Last 24 hours) at 01/21/2021 1315 Last data filed at 01/21/2021 1248 Gross per 24 hour  Intake 1022.4 ml  Output 1800 ml  Net -777.6 ml     Hyponatremia:  Sodium improving with diuresis.  Off IV fluids. Urine sodium elevated 77 Osm 306. Recent Labs  Lab 01/17/21 1300 01/18/21 0034 01/19/21 0041 01/20/21 0103 01/21/21 0136  NA 130* 129* 129* 128* 130*   HLD Chronic diastolic  CHF bnp 211, with leg edema. Creat better-tolerated IV Lasix.  Resume torsemide today.  HTN: BP well controlled, on Coreg.  Also on hydralazine but was not taking at home it seems-so we discontinued and his blood pressure is stable no hypotension   GERD-continue PPI  PAF on amiodarone and Coreg ,Eliquis discontinued recently for GI bleed  CAD on statin and beta-blocker Positive troponin 31> 69> 31, transiently high without chest pain.  EKG shows A. fib no acute ischemic changes-suspect demand ischemia due to patient's infection/chf-obtained echo to eval LVEF/wall motion-no regional wall motion abnormalities.  Stable.  Anemia of chronic disease/ chronic blood loss: S/p 1 unit PRBC and hemoglobin is stable.  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  Diet Order             Diet Heart Room service appropriate? Yes; Fluid consistency: Thin  Diet effective now                   Patient's Body mass index is 31.39 kg/m. DVT prophylaxis: SCDs Start: 01/17/21 0618 Code Status:   Code Status: DNR  Family Communication: plan of care discussed with patient at bedside. Status is: Inpatient  Remains inpatient appropriate because:IV treatments appropriate due to intensity of illness or inability to take PO and Inpatient level of care appropriate due to severity of illness  Dispo: The patient is from: Home              Anticipated d/c is to: SNF once available              Patient currently is not medically stable to d/c.   Difficult to place patient No  Unresulted Labs (From admission, onward)     Start     Ordered   01/22/21 0500  Vancomycin, random  Tomorrow morning,   R       Question:  Specimen collection method  Answer:  Lab=Lab collect   01/21/21 1119   01/22/21 1735  Basic  metabolic panel  Tomorrow morning,   R       Question:  Specimen collection method  Answer:  Lab=Lab collect   01/21/21 1145           Medications reviewed:  Scheduled Meds:  amiodarone  200 mg Oral Q lunch   atorvastatin  20 mg Oral Q lunch   carvedilol  6.25 mg Oral BID WC   escitalopram  10 mg Oral Q lunch   ferrous sulfate  325 mg Oral Q lunch   folic acid  1 mg Oral Q lunch   magnesium oxide  400 mg Oral Q lunch   nystatin   Topical BID   pantoprazole  40 mg Oral Q lunch   sodium bicarbonate  650 mg Oral BID   torsemide  40 mg Oral Daily   Continuous Infusions:  cefTRIAXone (ROCEPHIN)  IV 1 g (01/21/21 0501)   vancomycin 1,500 mg (01/21/21 0912)   Consultants:see note  Procedures:see note Antimicrobials: Anti-infectives (From admission, onward)    Start  Dose/Rate Route Frequency Ordered Stop   01/20/21 0600  cefTRIAXone (ROCEPHIN) 1 g in sodium chloride 0.9 % 100 mL IVPB        1 g 200 mL/hr over 30 Minutes Intravenous Every 24 hours 01/19/21 0845 01/24/21 0559   01/19/21 0800  vancomycin (VANCOREADY) IVPB 1500 mg/300 mL        1,500 mg 150 mL/hr over 120 Minutes Intravenous Every 48 hours 01/17/21 0531     01/17/21 0700  ceFEPIme (MAXIPIME) 2 g in sodium chloride 0.9 % 100 mL IVPB  Status:  Discontinued        2 g 200 mL/hr over 30 Minutes Intravenous Every 24 hours 01/17/21 0623 01/19/21 0845   01/17/21 0630  vancomycin (VANCOCIN) IVPB 1000 mg/200 mL premix  Status:  Discontinued        1,000 mg 200 mL/hr over 60 Minutes Intravenous  Once 01/17/21 0623 01/17/21 0624   01/17/21 0530  vancomycin (VANCOREADY) IVPB 2000 mg/400 mL        2,000 mg 200 mL/hr over 120 Minutes Intravenous  Once 01/17/21 0526 01/17/21 1118   01/17/21 0515  cefTRIAXone (ROCEPHIN) 1 g in sodium chloride 0.9 % 100 mL IVPB        1 g 200 mL/hr over 30 Minutes Intravenous  Once 01/17/21 0501 01/17/21 0658      Culture/Microbiology    Component Value Date/Time   SDES WOUND  01/20/2021 1106   SPECREQUEST RIGHT FOOT 01/20/2021 1106   CULT  01/20/2021 1106    MODERATE STAPHYLOCOCCUS AUREUS SUSCEPTIBILITIES TO FOLLOW Performed at [facility], [place] [address] [place], [place] [address]    REPTSTATUS PENDING 01/20/2021 1106    Other culture-see note  Objective: Vitals: Today's Vitals   01/21/21 0335 01/21/21 0400 01/21/21 0745 01/21/21 1147  BP: (!) 146/70 (!) 143/66 (!) 155/68 130/66  Pulse: 63 60 63 (!) 57  Resp: 19 18 17 18   Temp: 97.9 F (36.6 C)  97.7 F (36.5 C) 97.7 F (36.5 C)  TempSrc: Oral  Oral Oral  SpO2: 95% 96% 99% 100%  Weight:      Height:      PainSc:        Intake/Output Summary (Last 24 hours) at 01/21/2021 1315 Last data filed at 01/21/2021 1248 Gross per 24 hour  Intake 1022.4 ml  Output 1800 ml  Net -777.6 ml   Filed Weights   01/17/21 0459  Weight: 105 kg   Weight change:   Intake/Output from previous day: 09/14 0701 - 09/15 0700 In: 1022.4 [P.O.:240; IV Piggyback:782.4] Out: 1900 [Urine:1900] Intake/Output this shift: Total I/O In: 240 [P.O.:240] Out: -  Filed Weights   01/17/21 0459  Weight: 105 kg   Examination: General exam: AAO at baseline pleasantly confused appropriately interacting and answering questions, mild MCI, elderly HEENT:Oral mucosa moist, Ear/Nose WNL grossly, dentition normal. Respiratory system: bilaterally clear breath sounds, no use of accessory muscle Cardiovascular system: S1 & S2 +, No JVD,. Gastrointestinal system: Abdomen soft NT,ND, BS+ Nervous System:Alert, awake, moving extremities and grossly nonfocal Extremities: Edematous lower extremities much better compared to yesterday erythema on right foot much better Skin: No rashes,no icterus. MSK: Normal muscle bulk,tone, power .   PIC: On admissIon date   PIC: 01/20/21      Data Reviewed: I have personally reviewed following labs and imaging studies CBC: Recent Labs  Lab 01/16/21 1633 01/18/21 0034  01/19/21 0041 01/19/21 1702 01/20/21 0103 01/21/21 0136  WBC 15.7* 11.4* 8.9  --  8.8 6.4  NEUTROABS 14.2*  --   --   --   --   --   HGB 8.6* 7.6* 7.1* 8.0* 8.0* 8.2*  HCT 25.4* 22.1* 20.8* 23.9* 23.7* 23.7*  MCV 94.4 91.7 92.0  --  91.2 89.8  PLT 238 188 185  --  194 998   Basic Metabolic Panel: Recent Labs  Lab 01/17/21 1300 01/18/21 0034 01/19/21 0041 01/20/21 0103 01/21/21 0136  NA 130* 129* 129* 128* 130*  K 3.4* 3.5 3.6 3.5 3.5  CL 98 99 103 101 102  CO2 18* 19* 15* 18* 20*  GLUCOSE 101* 113* 102* 104* 101*  BUN 57* 57* 59* 59* 58*  CREATININE 4.29* 4.17* 3.91* 3.67* 3.46*  CALCIUM 8.3* 8.2* 8.1* 8.4* 8.4*  PHOS  --  3.1  --   --   --    GFR: Estimated Creatinine Clearance: 21.7 mL/min (A) (by C-G formula based on SCr of 3.46 mg/dL (H)). Liver Function Tests: Recent Labs  Lab 01/16/21 1633 01/18/21 0034  AST 23  --   ALT 14  --   ALKPHOS 72  --   BILITOT 1.0  --   PROT 6.2*  --   ALBUMIN 3.0* 2.2*   No results for input(s): LIPASE, AMYLASE in the last 168 hours. No results for input(s): AMMONIA in the last 168 hours. Coagulation Profile: Recent Labs  Lab 01/16/21 1633  INR 1.2   Cardiac Enzymes: No results for input(s): CKTOTAL, CKMB, CKMBINDEX, TROPONINI in the last 168 hours. BNP (last 3 results) No results for input(s): PROBNP in the last 8760 hours. HbA1C: No results for input(s): HGBA1C in the last 72 hours. CBG: No results for input(s): GLUCAP in the last 168 hours. Lipid Profile: No results for input(s): CHOL, HDL, LDLCALC, TRIG, CHOLHDL, LDLDIRECT in the last 72 hours. Thyroid Function Tests: No results for input(s): TSH, T4TOTAL, FREET4, T3FREE, THYROIDAB in the last 72 hours. Anemia Panel: No results for input(s): VITAMINB12, FOLATE, FERRITIN, TIBC, IRON, RETICCTPCT in the last 72 hours. Sepsis Labs: Recent Labs  Lab 01/17/21 0601  LATICACIDVEN 1.0    Recent Results (from the past 240 hour(s))  Urine Culture     Status:  Abnormal   Collection Time: 01/16/21  4:27 AM   Specimen: Urine, Clean Catch  Result Value Ref Range Status   Specimen Description URINE, CLEAN CATCH  Final   Special Requests   Final    NONE Performed at [facility], [address] [place], [place] [address]    Culture >=100,000 COLONIES/mL PROTEUS MIRABILIS (A)  Final   Report Status 01/19/2021 FINAL  Final   Organism ID, Bacteria PROTEUS MIRABILIS (A)  Final      Susceptibility   Proteus mirabilis - MIC*    AMPICILLIN <=2 SENSITIVE Sensitive     CEFAZOLIN <=4 SENSITIVE Sensitive     CEFEPIME <=0.12 SENSITIVE Sensitive     CEFTRIAXONE <=0.25 SENSITIVE Sensitive     CIPROFLOXACIN >=4 RESISTANT Resistant     GENTAMICIN 8 INTERMEDIATE Intermediate     IMIPENEM 2 SENSITIVE Sensitive     NITROFURANTOIN 128 RESISTANT Resistant     TRIMETH/SULFA >=320 RESISTANT Resistant     AMPICILLIN/SULBACTAM <=2 SENSITIVE Sensitive     PIP/TAZO <=4 SENSITIVE Sensitive     * >=100,000 COLONIES/mL PROTEUS MIRABILIS  Resp Panel by RT-PCR (Flu A&B, Covid)     Status: None   Collection Time: 01/17/21  3:52 AM   Specimen: Nasopharyngeal(NP) swabs in vial transport medium  Result Value Ref Range Status   SARS Coronavirus 2 by RT PCR NEGATIVE NEGATIVE Final    Comment: (NOTE) SARS-CoV-2 target nucleic acids are NOT DETECTED.  The SARS-CoV-2 RNA is generally detectable in upper respiratory specimens during the acute phase of infection. The lowest concentration of SARS-CoV-2 viral copies this assay can detect is 138 copies/mL. A negative result does not preclude SARS-Cov-2 infection and should not be used as the sole basis for treatment or other patient management decisions. A negative result may occur with  improper specimen collection/handling, submission of specimen other than nasopharyngeal swab, presence of viral mutation(s) within the areas targeted by this assay, and inadequate number of viral copies(<138 copies/mL). A negative result  must be combined with clinical observations, patient history, and epidemiological information. The expected result is Negative.  Fact Sheet for Patients:  EntrepreneurPulse.com.au  Fact Sheet for Healthcare Providers:  IncredibleEmployment.be  This test is no t yet approved or cleared by the [place] FDA and  has been authorized for detection and/or diagnosis of SARS-CoV-2 by FDA under an Emergency Use Authorization (EUA). This EUA will remain  in effect (meaning this test can be used) for the duration of the COVID-19 declaration under Section 564(b)(1) of the Act, 21 U.S.C.section 360bbb-3(b)(1), unless the authorization is terminated  or revoked sooner.       Influenza A by PCR NEGATIVE NEGATIVE Final   Influenza B by PCR NEGATIVE NEGATIVE Final    Comment: (NOTE) The Xpert Xpress SARS-CoV-2/FLU/RSV plus assay is intended as an aid in the diagnosis of influenza from Nasopharyngeal swab specimens and should not be used as a sole basis for treatment. Nasal washings and aspirates are unacceptable for Xpert Xpress SARS-CoV-2/FLU/RSV testing.  Fact Sheet for Patients: EntrepreneurPulse.com.au  Fact Sheet for Healthcare Providers: IncredibleEmployment.be  This test is not yet approved or cleared by the [place] FDA and has been authorized for detection and/or diagnosis of SARS-CoV-2 by FDA under an Emergency Use Authorization (EUA). This EUA will remain in effect (meaning this test can be used) for the duration of the COVID-19 declaration under Section 564(b)(1) of the Act, 21 U.S.C. section 360bbb-3(b)(1), unless the authorization is terminated or revoked.  Performed at [facility], [place] [address], [place], [place] [address]   Blood culture (routine x 2)     Status: None (Preliminary result)   Collection Time: 01/17/21  5:40 AM   Specimen: BLOOD RIGHT ARM  Result Value Ref Range Status    Specimen Description BLOOD RIGHT ARM  Final   Special Requests   Final    BOTTLES DRAWN AEROBIC AND ANAEROBIC Blood Culture adequate volume   Culture   Final    NO GROWTH 4 DAYS Performed at [facility], [place] [address] [place], [place] [address]    Report Status PENDING  Incomplete  Blood culture (routine x 2)     Status: None (Preliminary result)   Collection Time: 01/17/21  5:45 AM   Specimen: BLOOD RIGHT HAND  Result Value Ref Range Status   Specimen Description BLOOD RIGHT HAND  Final   Special Requests   Final    BOTTLES DRAWN AEROBIC ONLY Blood Culture adequate volume   Culture   Final    NO GROWTH 4 DAYS Performed at [facility], [place] [address], [place], [place] [address]    Report Status PENDING  Incomplete  Aerobic Culture w Gram Stain (superficial specimen)     Status: None (Preliminary result)  Collection Time: 01/20/21 11:06 AM   Specimen: Wound  Result Value Ref Range Status   Specimen Description WOUND  Final   Special Requests RIGHT FOOT  Final   Gram Stain   Final    RARE WBC PRESENT,BOTH PMN AND MONONUCLEAR RARE GRAM POSITIVE COCCI IN PAIRS    Culture   Final    MODERATE STAPHYLOCOCCUS AUREUS SUSCEPTIBILITIES TO FOLLOW Performed at [facility], [place] [address], [place], [place] [address]    Report Status PENDING  Incomplete  Resp Panel by RT-PCR (Flu A&B, Covid) Nasopharyngeal Swab     Status: None   Collection Time: 01/20/21 12:22 PM   Specimen: Nasopharyngeal Swab; Nasopharyngeal(NP) swabs in vial transport medium  Result Value Ref Range Status   SARS Coronavirus 2 by RT PCR NEGATIVE NEGATIVE Final    Comment: (NOTE) SARS-CoV-2 target nucleic acids are NOT DETECTED.  The SARS-CoV-2 RNA is generally detectable in upper respiratory specimens during the acute phase of infection. The lowest concentration of SARS-CoV-2 viral copies this assay can detect is 138 copies/mL. A negative result does not preclude SARS-Cov-2 infection and  should not be used as the sole basis for treatment or other patient management decisions. A negative result may occur with  improper specimen collection/handling, submission of specimen other than nasopharyngeal swab, presence of viral mutation(s) within the areas targeted by this assay, and inadequate number of viral copies(<138 copies/mL). A negative result must be combined with clinical observations, patient history, and epidemiological information. The expected result is Negative.  Fact Sheet for Patients:  EntrepreneurPulse.com.au  Fact Sheet for Healthcare Providers:  IncredibleEmployment.be  This test is no t yet approved or cleared by the [place] FDA and  has been authorized for detection and/or diagnosis of SARS-CoV-2 by FDA under an Emergency Use Authorization (EUA). This EUA will remain  in effect (meaning this test can be used) for the duration of the COVID-19 declaration under Section 564(b)(1) of the Act, 21 U.S.C.section 360bbb-3(b)(1), unless the authorization is terminated  or revoked sooner.       Influenza A by PCR NEGATIVE NEGATIVE Final   Influenza B by PCR NEGATIVE NEGATIVE Final    Comment: (NOTE) The Xpert Xpress SARS-CoV-2/FLU/RSV plus assay is intended as an aid in the diagnosis of influenza from Nasopharyngeal swab specimens and should not be used as a sole basis for treatment. Nasal washings and aspirates are unacceptable for Xpert Xpress SARS-CoV-2/FLU/RSV testing.  Fact Sheet for Patients: EntrepreneurPulse.com.au  Fact Sheet for Healthcare Providers: IncredibleEmployment.be  This test is not yet approved or cleared by the [place] FDA and has been authorized for detection and/or diagnosis of SARS-CoV-2 by FDA under an Emergency Use Authorization (EUA). This EUA will remain in effect (meaning this test can be used) for the duration of the COVID-19 declaration  under Section 564(b)(1) of the Act, 21 U.S.C. section 360bbb-3(b)(1), unless the authorization is terminated or revoked.  Performed at [facility], [place] [address], [place], [place] [address]       Radiology Studies: No results found.   LOS: 4 days   Antonieta Pert, MD [facility]  01/21/2021, 1:15 PM

## 2021-01-21 NOTE — Progress Notes (Signed)
Physical Therapy Treatment Patient Details Name: Darryl Diaz MRN: [id number] DOB: 04/24/1941 Today's Date: 01/21/2021   History of Present Illness Pt is a 80 y.o. male admitted from [facility] on 01/16/2021 with recurrent falls as well as LE, back, and head pain. Workup for BLE cellulitis with foreign body removed from R foot, as well as UTI. 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 PMH includes PAF, chronic diastolic CHF, CKD stage IV, tachybradycardia syndrome, PAD with nonhealing left foot wounds, HLD, HTN, GIB.   PT Comments    Pt progressing with mobility. Today's session focused on transfer and gait training with rollator, as well as seated/standing LE therex; pt requiring up to [facility] for mobility. Pt remains limited by generalized weakness, decreased activity tolerance, and impaired balance strategies/postural reactions. Continue to recommend SNF-level therapies to maximize functional mobility and independence.    Recommendations for follow up therapy are one component of a multi-disciplinary discharge planning process, led by the attending physician.  Recommendations may be updated based on patient status, additional functional criteria and insurance authorization.  Follow Up Recommendations  SNF;Supervision for mobility/OOB     Equipment Recommendations  None recommended by PT    Recommendations for Other Services       Precautions / Restrictions Precautions Precautions: Fall Restrictions Weight Bearing Restrictions: No     Mobility  Bed Mobility Overal bed mobility: Needs Assistance Bed Mobility: Supine to Sit     Supine to sit: Min assist;HOB elevated     General bed mobility comments: MinA for HHA to elevate trunk    Transfers Overall transfer level: Needs assistance Equipment used: 4-wheeled walker Transfers: Sit to/from Stand Sit to Stand: Mod assist;Min guard;Min assist          General transfer comment: Pt reliant on momentum and modA for initial trunk elevation standing from EOB to rollator; additional repeated sit<>stands from recliner, heavy reliance on BUE support to push into standing, min guard, regressing to minA with increased fatigue  Ambulation/Gait Ambulation/Gait assistance: Min guard;Min assist Gait Distance (Feet): 62 Feet Assistive device: 4-wheeled walker Gait Pattern/deviations: Step-through pattern;Decreased stride length;Trunk flexed;Drifts right/left Gait velocity: Decreased   General Gait Details: Slow, mildly unsteady gait with rollator requiring initial minA for stability, progressing to min guard; limited by fatigue and c/o feeling weak   Stairs             Wheelchair Mobility    Modified Rankin (Stroke Patients Only)       Balance Overall balance assessment: Needs assistance Sitting-balance support: Feet supported;No upper extremity supported Sitting balance-Leahy Scale: Fair Sitting balance - Comments: requires assist to don socks   Standing balance support: Bilateral upper extremity supported Standing balance-Leahy Scale: Poor Standing balance comment: reliant on BUE support                            Cognition Arousal/Alertness: Awake/alert Behavior During Therapy: WFL for tasks assessed/performed Overall Cognitive Status: No family/caregiver present to determine baseline cognitive functioning                                 General Comments: WFL for majority of simple tasks, good insight and safety awareness; suspect some decreased attention and STM deficits, likely near baseline cognition      Exercises General Exercises - Lower Extremity Long Arc Quad: AROM;Both;Seated Hip Flexion/Marching:  AROM;Both;Seated Toe Raises: AROM;Both;Seated Heel Raises: AROM;Both;Seated Other Exercises Other Exercises: 5x repeated sit<>stand (heavy reliance on BUE support)    General Comments  General comments (skin integrity, edema, etc.): Noted BLE swelling, elevated feet in recliner at end of session. DOE 3/4, vitals stable on RA. Pt aware of plan for d/c to SNF side of [facility] for continued rehab      Pertinent Vitals/Pain Pain Assessment: 0-10 Pain Score: 1  Pain Location: bilateral feet Pain Descriptors / Indicators: Sore Pain Intervention(s): Monitored during session    Home Living                      Prior Function            PT Goals (current goals can now be found in the care plan section) Progress towards PT goals: Progressing toward goals    Frequency    Min 2X/week      PT Plan Current plan remains appropriate    Co-evaluation              AM-PAC PT "6 Clicks" Mobility   Outcome Measure  Help needed turning from your back to your side while in a flat bed without using bedrails?: A Little Help needed moving from lying on your back to sitting on the side of a flat bed without using bedrails?: A Little Help needed moving to and from a bed to a chair (including a wheelchair)?: A Little Help needed standing up from a chair using your arms (e.g., wheelchair or bedside chair)?: A Lot Help needed to walk in hospital room?: A Little Help needed climbing 3-5 steps with a railing? : A Lot 6 Click Score: 16    End of Session Equipment Utilized During Treatment: Gait belt Activity Tolerance: Patient tolerated treatment well Patient left: in chair;with call bell/phone within reach;with chair alarm set Nurse Communication: Mobility status PT Visit Diagnosis: Unsteadiness on feet (R26.81);Other abnormalities of gait and mobility (R26.89);History of falling (Z91.81);Repeated falls (R29.6);Pain     Time: 6384-5364 PT Time Calculation (min) (ACUTE ONLY): 24 min  Charges:  $Therapeutic Exercise: 8-22 mins $Therapeutic Activity: 8-22 mins                     Mabeline Caras, PT, DPT Acute Rehabilitation Services  Pager  [phone] Office [place] 01/21/2021, 5:28 PM

## 2021-01-22 DIAGNOSIS — N179 Acute kidney failure, unspecified: Secondary | ICD-10-CM | POA: Diagnosis not present

## 2021-01-22 DIAGNOSIS — K922 Gastrointestinal hemorrhage, unspecified: Secondary | ICD-10-CM | POA: Diagnosis not present

## 2021-01-22 DIAGNOSIS — I480 Paroxysmal atrial fibrillation: Secondary | ICD-10-CM | POA: Diagnosis not present

## 2021-01-22 DIAGNOSIS — N184 Chronic kidney disease, stage 4 (severe): Secondary | ICD-10-CM | POA: Diagnosis not present

## 2021-01-22 DIAGNOSIS — D649 Anemia, unspecified: Secondary | ICD-10-CM | POA: Diagnosis not present

## 2021-01-22 DIAGNOSIS — E785 Hyperlipidemia, unspecified: Secondary | ICD-10-CM | POA: Diagnosis not present

## 2021-01-22 DIAGNOSIS — I10 Essential (primary) hypertension: Secondary | ICD-10-CM | POA: Diagnosis not present

## 2021-01-22 DIAGNOSIS — I5022 Chronic systolic (congestive) heart failure: Secondary | ICD-10-CM | POA: Diagnosis not present

## 2021-01-22 DIAGNOSIS — S91309A Unspecified open wound, unspecified foot, initial encounter: Secondary | ICD-10-CM | POA: Diagnosis not present

## 2021-01-22 DIAGNOSIS — L309 Dermatitis, unspecified: Secondary | ICD-10-CM | POA: Diagnosis not present

## 2021-01-22 DIAGNOSIS — Z7689 Persons encountering health services in other specified circumstances: Secondary | ICD-10-CM | POA: Diagnosis not present

## 2021-01-22 DIAGNOSIS — R531 Weakness: Secondary | ICD-10-CM | POA: Diagnosis not present

## 2021-01-22 DIAGNOSIS — R2689 Other abnormalities of gait and mobility: Secondary | ICD-10-CM | POA: Diagnosis not present

## 2021-01-22 DIAGNOSIS — M6281 Muscle weakness (generalized): Secondary | ICD-10-CM | POA: Diagnosis not present

## 2021-01-22 DIAGNOSIS — I951 Orthostatic hypotension: Secondary | ICD-10-CM | POA: Diagnosis not present

## 2021-01-22 DIAGNOSIS — R278 Other lack of coordination: Secondary | ICD-10-CM | POA: Diagnosis not present

## 2021-01-22 DIAGNOSIS — N057 Unspecified nephritic syndrome with diffuse crescentic glomerulonephritis: Secondary | ICD-10-CM | POA: Diagnosis not present

## 2021-01-22 DIAGNOSIS — N058 Unspecified nephritic syndrome with other morphologic changes: Secondary | ICD-10-CM | POA: Diagnosis not present

## 2021-01-22 DIAGNOSIS — R944 Abnormal results of kidney function studies: Secondary | ICD-10-CM | POA: Diagnosis not present

## 2021-01-22 DIAGNOSIS — N19 Unspecified kidney failure: Secondary | ICD-10-CM | POA: Diagnosis not present

## 2021-01-22 DIAGNOSIS — D500 Iron deficiency anemia secondary to blood loss (chronic): Secondary | ICD-10-CM | POA: Diagnosis not present

## 2021-01-22 DIAGNOSIS — I482 Chronic atrial fibrillation, unspecified: Secondary | ICD-10-CM | POA: Diagnosis not present

## 2021-01-22 DIAGNOSIS — I495 Sick sinus syndrome: Secondary | ICD-10-CM | POA: Diagnosis not present

## 2021-01-22 DIAGNOSIS — L0390 Cellulitis, unspecified: Secondary | ICD-10-CM | POA: Diagnosis not present

## 2021-01-22 DIAGNOSIS — L03115 Cellulitis of right lower limb: Secondary | ICD-10-CM | POA: Diagnosis not present

## 2021-01-22 DIAGNOSIS — J309 Allergic rhinitis, unspecified: Secondary | ICD-10-CM | POA: Diagnosis not present

## 2021-01-22 DIAGNOSIS — I5032 Chronic diastolic (congestive) heart failure: Secondary | ICD-10-CM | POA: Diagnosis not present

## 2021-01-22 DIAGNOSIS — N390 Urinary tract infection, site not specified: Secondary | ICD-10-CM | POA: Diagnosis not present

## 2021-01-22 DIAGNOSIS — I5042 Chronic combined systolic (congestive) and diastolic (congestive) heart failure: Secondary | ICD-10-CM | POA: Diagnosis not present

## 2021-01-22 DIAGNOSIS — Z7401 Bed confinement status: Secondary | ICD-10-CM | POA: Diagnosis not present

## 2021-01-22 DIAGNOSIS — R609 Edema, unspecified: Secondary | ICD-10-CM | POA: Diagnosis not present

## 2021-01-22 DIAGNOSIS — F39 Unspecified mood [affective] disorder: Secondary | ICD-10-CM | POA: Diagnosis not present

## 2021-01-22 LAB — BASIC METABOLIC PANEL
Anion gap: 12 (ref 5–15)
BUN: 58 mg/dL — ABNORMAL HIGH (ref 8–23)
CO2: 21 mmol/L — ABNORMAL LOW (ref 22–32)
Calcium: 8.6 mg/dL — ABNORMAL LOW (ref 8.9–10.3)
Chloride: 100 mmol/L (ref 98–111)
Creatinine, Ser: 3.38 mg/dL — ABNORMAL HIGH (ref 0.61–1.24)
GFR, Estimated: 18 mL/min — ABNORMAL LOW (ref >60)
Glucose, Bld: 102 mg/dL — ABNORMAL HIGH (ref 70–99)
Potassium: 3.6 mmol/L (ref 3.5–5.1)
Sodium: 133 mmol/L — ABNORMAL LOW (ref 135–145)

## 2021-01-22 LAB — AEROBIC CULTURE W GRAM STAIN (SUPERFICIAL SPECIMEN)

## 2021-01-22 LAB — CULTURE, BLOOD (ROUTINE X 2)
Culture: NO GROWTH
Culture: NO GROWTH
Special Requests: ADEQUATE
Special Requests: ADEQUATE

## 2021-01-22 LAB — VANCOMYCIN, RANDOM: Vancomycin Rm: 37

## 2021-01-22 MED ORDER — CEFADROXIL 500 MG PO CAPS: 500.0000 mg | ORAL_CAPSULE | Freq: Two times a day (BID) | ORAL | 0 refills | Status: AC

## 2021-01-22 MED ORDER — DOXYCYCLINE MONOHYDRATE 100 MG PO TABS: 100.0000 mg | ORAL_TABLET | Freq: Two times a day (BID) | ORAL | 0 refills | Status: AC

## 2021-01-22 MED ORDER — SODIUM BICARBONATE 650 MG PO TABS: 650.0000 mg | ORAL_TABLET | Freq: Two times a day (BID) | ORAL | Status: DC

## 2021-01-22 MED ORDER — TORSEMIDE 40 MG PO TABS: 40.0000 mg | ORAL_TABLET | Freq: Every day | ORAL | Status: DC

## 2021-01-22 MED ORDER — NYSTATIN 100000 UNIT/GM EX POWD: Freq: Two times a day (BID) | CUTANEOUS | 0 refills | Status: DC

## 2021-01-22 NOTE — Progress Notes (Signed)
Pharmacy Antibiotic Note  Darryl Diaz is a 80 y.o. male admitted on 01/16/2021 with  multiple falls over the past week .  Pharmacy has been consulted for vancomycin dosing for lower extremity cellulitis/wound.   Patient afebrile and WBC wnl. AKI noted, SCr down to 3.38. BCx are ngtd, UCx with Proteus mirabilis sensitive to ceftriaxone, wound culture with moderate staph aureus. Random vancomycin level 37 this morning.  Plan: Continue Vancomycin 1500 mg IV every 48 hours.  Goal trough 10-15 mcg/mL. Monitor renal function, clinical progress, cultures/sensitivities F/U LOT and de-escalate as able Vancomycin levels as clinically indicated   Height: 6' (182.9 cm) Weight: 105 kg (231 lb 7.7 oz) IBW/kg (Calculated) : 77.6  Temp (24hrs), Avg:97.8 F (36.6 C), Min:97.7 F (36.5 C), Max:97.8 F (36.6 C)  Recent Labs  Lab 01/16/21 1633 01/17/21 0601 01/17/21 1300 01/18/21 0034 01/19/21 0041 01/19/21 0646 01/20/21 0103 01/21/21 0136 01/22/21 0035  WBC 15.7*  --   --  11.4* 8.9  --  8.8 6.4  --   CREATININE 4.29*  --    < > 4.17* 3.91*  --  3.67* 3.46* 3.38*  LATICACIDVEN  --  1.0  --   --   --   --   --   --   --   VANCORANDOM  --   --   --   --   --  14  --   --  37   < > = values in this interval not displayed.     Estimated Creatinine Clearance: 22.2 mL/min (A) (by C-G formula based on SCr of 3.38 mg/dL (H)).    Allergies  Allergen Reactions   Penicillin G Sodium     Other reaction(s): rash   Penicillins Other (See Comments)    Tolerated Cephalosporin Date: 10/15/19. Allergy is from childhood Did it involve swelling of the face/tongue/throat, SOB, or low BP? Unknown Did it involve sudden or severe rash/hives, skin peeling, or any reaction on the inside of your mouth or nose? Unknown Did you need to seek medical attention at a hospital or doctor's office? Unknown When did it last happen?    childhood reaction   If all above answers are "NO", may proceed with  cephalosporin use.   Penicillins      Thank you for allowing [place] to participate in this patients care. Jens Som, PharmD 01/22/2021 7:10 AM  **Pharmacist phone directory can be found on [email or web address] listed under [place]**

## 2021-01-22 NOTE — Progress Notes (Addendum)
Pt left with all belongings (clothing, keys, phone, charger, black book, papers and pencil) with EMS. Pt had a very large BM just before leaving. Facility called to be made aware that pt was on the way

## 2021-01-22 NOTE — TOC Transition Note (Addendum)
Transition of Care [facility]) - CM/SW Discharge Note   Patient Details  Name: Darryl Diaz MRN: [id number] Date of Birth: Sep 13, 1940  [facility] CM/SW Contact:  Benard Halsted, LCSW Phone Number: 01/22/2021, 12:07 PM   Clinical Narrative:    Patient will DC to: [place] [place] [facility] Anticipated DC date: 01/22/21 Family notified: Sister, [name] Transport by: Corey Harold   Per MD patient ready for DC to [facility]. RN to call report prior to discharge [phone] room 303A). RN, patient, patient's family, and facility notified of DC. Discharge Summary and FL2 sent to facility. DC packet on chart. Ambulance transport requested for patient.   CSW will sign off for now as social work intervention is no longer needed. Please consult [place] again if new needs arise.     Final next level of care: Skilled Nursing Facility Barriers to Discharge: Barriers Resolved   Patient Goals and CMS Choice Patient states their goals for this hospitalization and ongoing recovery are:: Rehab CMS Medicare.gov Compare Post Acute Care list provided to:: Patient Choice offered to / list presented to : Patient  Discharge Placement   Existing PASRR number confirmed : 01/22/21          Patient chooses bed at: [place] Patient to be transferred to facility by: [place] Name of family member notified: [name] Patient and family notified of of transfer: 01/22/21  Discharge Plan and Services In-house Referral: Clinical Social Work   Post Acute Care Choice: [place]                               Social Determinants of Health (SDOH) Interventions     Readmission Risk Interventions Readmission Risk Prevention Plan 01/18/2021 03/18/2020 03/05/2020  Transportation Screening Complete Complete Complete  PCP or Specialist Appt within 5-7 Days - - -  Home Care Screening - - -  Medication Review (RN CM) - - -  [place] or [place] - Complete Complete  Social Work Consult for [place]  Planning/Counseling - Complete Complete  Palliative Care Screening - Not Applicable Not Applicable  Medication Review [employer]) Complete Complete Complete  PCP or Specialist appointment within 3-5 days of discharge Complete - -  [place] or Home Care Consult Complete - -  SW Recovery Care/Counseling Consult Complete - -  Palliative Care Screening Not Applicable - -  Skilled Nursing Facility Complete - -  Some recent data might be hidden

## 2021-01-22 NOTE — Discharge Summary (Signed)
Physician Discharge Summary  Darryl Diaz TWK:[id number] DOB: [birth year] DOA: 01/16/2021  PCP: [name], MD  Admit date: 01/16/2021 Discharge date: 01/22/2021  Admitted From: home Disposition:  SNF  Recommendations for Outpatient Follow-up:  Follow up with PCP in 1-2 weeks Please obtain BMP/CBC in one week   Home Health:No  Equipment/Devices: none  Discharge Condition: Stable Code Status:   Code Status: DNR Diet recommendation:  Diet Order             Diet Heart Room service appropriate? Yes; Fluid consistency: Thin  Diet effective now                    Brief/Interim Summary:  80yom PAF, chronic diastolic CHF, CKD stage IV, tachybradycardia syndrome, PAD with nonhealing left foot wounds, HLD, HTN, history of GI bleed, 1 disorder presented from the facility with recurrent falls about 3 in the past 1 week and was complaining of leg and low back as well as head pain.  Complaining of increasing pain and swelling to his right leg greater than left.  Eliquis when he was in the hospital for GI bleed previously 8/31-9/3   In the ED,Vitals fairly stable, received IV fluid bolus, vancomycin and ceftriaxone, He had foreign body on the right foot that was removed. Imaging:chest x-ray lower lung volumes with patchy and barely bibasilar opacity diffuse pulmonary vascular congestion, suspected interstitial edema with possible small pleural effusion new from last year but unchanged compared to May, chronic right rib fractures. X-ray lumbar spine diffuse severe spine degeneration, CT head no acute intracranial findings severe brain parenchymal atrophy with chronic microvascular disease. CT cervical spine multilevel spondylosis and facet atrophy. X-ray foot right soft tissue swelling. Labs showed abnormal UA with pyuria, leukocytosis 15.7 BNP 315, troponin 31, CMP hyponatremia 129 potassium 3.5 BUN/creatinine 53/4.2 recent baseline 2.8-3.4, blood cultures were sent. Patient was  admitted for cellulitis  rt> left in the setting of chronic leg swelling. 9/13 hemoglobin decreased to 7.1 given 1 unit PRBC-with improvement in hemoglobin Edema redness improving-had a pus expressed from the site of foreign body removal from the dorsum of the right foot and sent for wound culture-which is growing moderate Staph aureus. Patient has been back on his diuretic regimen renal function holding well, leg edema improving redness cellulitis much better.  He is deconditioned and weak frail and will benefit with skilled nursing facility placement  Discharge Diagnoses:  Sepsis due to cellulitis POA Staph cellulitis right lower leg more than left with foreign body: met sepsis criteria with heart rate >90, leukocytosis 15.7K,Foreign body removed from foot.  On vancomycin and cefepime>changed to ceftriaxone/vancomycin 9.13- CT showed severe diffuse soft tissue swelling, no acute osseous abnormalities.Leukocytosis nicely improved.  Blood culture no growth so far .  I was able to express pus from his right foot wound 9/14- sent for culture-growing Staph aureus-at this time overall much improved will switch to oral antibiotics for SNF-home continue elevate continue diuresis continue to monitor leg follow-up with PCP in 1 week  Recent Labs  Lab 01/16/21 1633 01/17/21 0601 01/18/21 0034 01/19/21 0041 01/20/21 0103 01/21/21 0136  WBC 15.7*  --  11.4* 8.9 8.8 6.4  LATICACIDVEN  --  1.0  --   --   --   --     Acute lower UTI 2/2 Proteus mirabilis-Sensitive to ceftriaxone-antibiotics.  Complete total 5 days course  AKI on CKD stage IV with metabolic acidosis:BUN/creatinine 53/4.2 recent baseline 2.8-3.4, home torsemide held, was placed on  gentle IV fluids- sp lasix 9/13 with blood transfusion - BUN/creatinine continues to downtrend slowly-and has improved to baseline, tolerating diuretics continue same continue oral bicarb follow-up BMP at facility Recent Labs  Lab 01/18/21 0034 01/19/21 0041  01/20/21 0103 01/21/21 0136 01/22/21 0035  BUN 57* 59* 59* 58* 58*  CREATININE 4.17* 3.91* 3.67* 3.46* 3.38*  Net IO Since Admission: -608.15 mL [01/22/21 1149]     Hyponatremia: Sodium improved w/ diuretics. Bmp in 1 wk Recent Labs  Lab 01/18/21 0034 01/19/21 0041 01/20/21 0103 01/21/21 0136 01/22/21 0035  NA 129* 129* 128* 130* 133*   HLD Chronic diastolic CHF bnp 643, with leg edema.  He will continue torsemide monitor intake output Daily weight at the facility BMP in 1 week   HTN: BP well controlled, on Coreg.   GERD-continue PPI  PAF on amiodarone and Coreg ,Eliquis discontinued recently for GI bleed  CAD on statin and beta-blocker Positive troponin 31> 69> 31, transiently high without chest pain.  EKG shows A. fib no acute ischemic changes-suspect demand ischemia due to patient's infection/chf-obtained echo to eval LVEF/wall motion-no regional wall motion abnormalities.  Stable.  Anemia of chronic disease/ chronic blood loss: S/p 1 unit PRBC and hemoglobin is stable. 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    Consults: none  Subjective: Alert awake resting comfortably leg edema much improved redness has resolved on the leg Discharge Exam: Vitals:   01/22/21 0320 01/22/21 0746  BP: (!) 149/65 (!) 146/64  Pulse: 60 69  Resp: 19 19  Temp: 97.8 F (36.6 C)   SpO2: 98%    General: Pt is alert, awake, not in acute distress Cardiovascular: RRR, S1/S2 +, no rubs, no gallops Respiratory: CTA bilaterally, no wheezing, no rhonchi Abdominal: Soft, NT, ND, bowel sounds + Extremities: no edema, no cyanosis  Discharge Instructions  Discharge Instructions     (HEART FAILURE PATIENTS) Call MD:  Anytime you have any of the following symptoms: 1) 3 pound  weight gain in 24 hours or 5 pounds in 1 week 2) shortness of breath, with or without a dry hacking cough 3) swelling in the hands, feet or stomach 4) if you have to sleep on extra pillows at night in order to breathe.   Complete by: As directed    Discharge instructions   Complete by: As directed    Check BMP in 1 week to adjust the TORSEMIDE.  Please call call MD or return to ER for similar or worsening recurring problem that brought you to hospital or if any fever,nausea/vomiting,abdominal pain, uncontrolled pain, chest pain,  shortness of breath or any other alarming symptoms.  Please follow-up your doctor as instructed in a week time and call the office for appointment.  Please avoid alcohol, smoking, or any other illicit substance and maintain healthy habits including taking your regular medications as prescribed.  You were cared for by a hospitalist during your hospital stay. If you have any questions about your discharge medications or the care you received while you were in the hospital after you are discharged, you can call the unit and ask to speak with the hospitalist on call if the hospitalist that took care of you is not available.  Once you are discharged, your primary care  physician will handle any further medical issues. Please note that NO REFILLS for any discharge medications will be authorized once you are discharged, as it is imperative that you return to your primary care physician (or establish a relationship with a primary care physician if you do not have one) for your aftercare needs so that they can reassess your need for medications and monitor your lab values   Discharge wound care:   Complete by: As directed    Routine dressing care   Increase activity slowly   Complete by: As directed       Allergies as of 01/22/2021       Reactions   Penicillin G Sodium    Other reaction(s): rash   Penicillins Other (See Comments)   Tolerated Cephalosporin Date:  10/15/19. Allergy is from childhood Did it involve swelling of the face/tongue/throat, SOB, or low BP? Unknown Did it involve sudden or severe rash/hives, skin peeling, or any reaction on the inside of your mouth or nose? Unknown Did you need to seek medical attention at a hospital or doctor's office? Unknown When did it last happen?    childhood reaction   If all above answers are "NO", may proceed with cephalosporin use.   Penicillins         Medication List     STOP taking these medications    hydrALAZINE 50 MG tablet Commonly known as: APRESOLINE       TAKE these medications    acetaminophen 325 MG tablet Commonly known as: TYLENOL Take 2 tablets (650 mg total) by mouth every 4 (four) hours as needed for headache or mild pain.   amiodarone 200 MG tablet Commonly known as: PACERONE Take 1 tablet (200 mg total) by mouth daily. What changed: when to take this   atorvastatin 20 MG tablet Commonly known as: LIPITOR Take 1 tablet (20 mg total) by mouth daily. What changed: when to take this   carvedilol 6.25 MG tablet Commonly known as: COREG Take 1 tablet (6.25 mg total) by mouth 2 (two) times daily with a meal.   cefadroxil 500 MG capsule Commonly known as: DURICEF Take 1 capsule (500 mg total) by mouth 2 (two) times daily for 2 days.   Centrum Silver 50+Men Tabs Take 1 tablet by mouth daily with breakfast.   doxycycline 100 MG tablet Commonly known as: ADOXA Take 1 tablet (100 mg total) by mouth 2 (two) times daily for 7 days.   escitalopram 10 MG tablet Commonly known as: LEXAPRO Take 1 tablet (10 mg total) by mouth daily. What changed: when to take this   ferrous sulfate 325 (65 FE) MG tablet Take 1 tablet (325 mg total) by mouth daily. What changed: when to take this   folic acid 1 MG tablet Commonly known as: FOLVITE Take 1 tablet (1 mg total) by mouth daily. What changed: when to take this   ketoconazole 2 % cream Commonly known as:  NIZORAL Apply 1 application topically daily. To dry scaly skin on feet What changed:  when to take this reasons to take this additional instructions   loratadine 10 MG tablet Commonly known as: CLARITIN Take 1 tablet (10 mg total) by mouth daily. What changed:  when to take this reasons to take this   Magnesium Oxide 400 MG Caps Take 1 capsule (400 mg total) by mouth daily. What changed: when to take this   methocarbamol 500 MG tablet Commonly known as: ROBAXIN Take 1 tablet (500 mg total) by  mouth every 6 (six) hours as needed for muscle spasms.   nitroGLYCERIN 0.4 MG SL tablet Commonly known as: NITROSTAT Place 1 tablet (0.4 mg total) under the tongue every 5 (five) minutes x 3 doses as needed for chest pain. What changed: additional instructions   nystatin powder Commonly known as: MYCOSTATIN/NYSTOP Apply topically 2 (two) times daily.   pantoprazole 40 MG tablet Commonly known as: PROTONIX Take 1 tablet (40 mg total) by mouth daily. What changed: when to take this   potassium chloride SA 20 MEQ tablet Commonly known as: KLOR-CON Take 1 tablet (20 mEq total) by mouth daily. What changed: when to take this   sodium bicarbonate 650 MG tablet Take 1 tablet (650 mg total) by mouth 2 (two) times daily.   Torsemide 40 MG Tabs Take 40 mg by mouth daily. What changed:  medication strength how much to take when to take this reasons to take this additional instructions   traZODone 50 MG tablet Commonly known as: DESYREL Take 1 tablet (50 mg total) by mouth at bedtime.               Discharge Care Instructions  (From admission, onward)           Start     Ordered   01/22/21 0000  Discharge wound care:       Comments: Routine dressing care   01/22/21 1147            Follow-up Information     Tisovec, [name], MD Follow up in 1 week(s).   Specialty: Internal Medicine Why: cbc, bmp in 1 wk Contact information: [place] [address] [phone]         Sherren Mocha, MD .   Specialty: Cardiology Contact information: [address] [place] [address] [phone]         Deboraha Sprang, MD .   Specialty: Cardiology Contact information: [phone] [place] [address] [phone]                Allergies  Allergen Reactions   Penicillin G Sodium     Other reaction(s): rash   Penicillins Other (See Comments)    Tolerated Cephalosporin Date: 10/15/19. Allergy is from childhood Did it involve swelling of the face/tongue/throat, SOB, or low BP? Unknown Did it involve sudden or severe rash/hives, skin peeling, or any reaction on the inside of your mouth or nose? Unknown Did you need to seek medical attention at a hospital or doctor's office? Unknown When did it last happen?    childhood reaction   If all above answers are "NO", may proceed with cephalosporin use.   Penicillins     The results of significant diagnostics from this hospitalization (including imaging, microbiology, ancillary and laboratory) are listed below for reference.    Microbiology: Recent Results (from the past 240 hour(s))  Urine Culture     Status: Abnormal   Collection Time: 01/16/21  4:27 AM   Specimen: Urine, Clean Catch  Result Value Ref Range Status   Specimen Description URINE, CLEAN CATCH  Final   Special Requests   Final    NONE Performed at [facility], [place] [address], [place], [place] [address]    Culture >=100,000 COLONIES/mL PROTEUS MIRABILIS (A)  Final   Report Status 01/19/2021 FINAL  Final   Organism ID, Bacteria PROTEUS MIRABILIS (A)  Final      Susceptibility   Proteus  mirabilis - MIC*    AMPICILLIN <=2 SENSITIVE Sensitive     CEFAZOLIN <=4 SENSITIVE Sensitive     CEFEPIME <=0.12 SENSITIVE Sensitive     CEFTRIAXONE <=0.25 SENSITIVE Sensitive     CIPROFLOXACIN >=4 RESISTANT Resistant     GENTAMICIN 8 INTERMEDIATE  Intermediate     IMIPENEM 2 SENSITIVE Sensitive     NITROFURANTOIN 128 RESISTANT Resistant     TRIMETH/SULFA >=320 RESISTANT Resistant     AMPICILLIN/SULBACTAM <=2 SENSITIVE Sensitive     PIP/TAZO <=4 SENSITIVE Sensitive     * >=100,000 COLONIES/mL PROTEUS MIRABILIS  Resp Panel by RT-PCR (Flu A&B, Covid)     Status: None   Collection Time: 01/17/21  3:52 AM   Specimen: Nasopharyngeal(NP) swabs in vial transport medium  Result Value Ref Range Status   SARS Coronavirus 2 by RT PCR NEGATIVE NEGATIVE Final    Comment: (NOTE) SARS-CoV-2 target nucleic acids are NOT DETECTED.  The SARS-CoV-2 RNA is generally detectable in upper respiratory specimens during the acute phase of infection. The lowest concentration of SARS-CoV-2 viral copies this assay can detect is 138 copies/mL. A negative result does not preclude SARS-Cov-2 infection and should not be used as the sole basis for treatment or other patient management decisions. A negative result may occur with  improper specimen collection/handling, submission of specimen other than nasopharyngeal swab, presence of viral mutation(s) within the areas targeted by this assay, and inadequate number of viral copies(<138 copies/mL). A negative result must be combined with clinical observations, patient history, and epidemiological information. The expected result is Negative.  Fact Sheet for Patients:  EntrepreneurPulse.com.au  Fact Sheet for Healthcare Providers:  IncredibleEmployment.be  This test is no t yet approved or cleared by the [place] FDA and  has been authorized for detection and/or diagnosis of SARS-CoV-2 by FDA under an Emergency Use Authorization (EUA). This EUA will remain  in effect (meaning this test can be used) for the duration of the COVID-19 declaration under Section 564(b)(1) of the Act, 21 U.S.C.section 360bbb-3(b)(1), unless the authorization is terminated  or revoked sooner.        Influenza A by PCR NEGATIVE NEGATIVE Final   Influenza B by PCR NEGATIVE NEGATIVE Final    Comment: (NOTE) The Xpert Xpress SARS-CoV-2/FLU/RSV plus assay is intended as an aid in the diagnosis of influenza from Nasopharyngeal swab specimens and should not be used as a sole basis for treatment. Nasal washings and aspirates are unacceptable for Xpert Xpress SARS-CoV-2/FLU/RSV testing.  Fact Sheet for Patients: EntrepreneurPulse.com.au  Fact Sheet for Healthcare Providers: IncredibleEmployment.be  This test is not yet approved or cleared by the [place] FDA and has been authorized for detection and/or diagnosis of SARS-CoV-2 by FDA under an Emergency Use Authorization (EUA). This EUA will remain in effect (meaning this test can be used) for the duration of the COVID-19 declaration under Section 564(b)(1) of the Act, 21 U.S.C. section 360bbb-3(b)(1), unless the authorization is terminated or revoked.  Performed at [facility], [place] [address], [place], [place] [address]   Blood culture (routine x 2)     Status: None   Collection Time: 01/17/21  5:40 AM   Specimen: BLOOD RIGHT ARM  Result Value Ref Range Status   Specimen Description BLOOD RIGHT ARM  Final   Special Requests   Final    BOTTLES DRAWN AEROBIC AND ANAEROBIC Blood Culture adequate volume   Culture   Final    NO GROWTH 5 DAYS Performed at [facility], [address], [place], [place] [address]    Report Status 01/22/2021 FINAL  Final  Blood culture (routine x 2)     Status: None   Collection Time: 01/17/21  5:45 AM   Specimen: BLOOD RIGHT HAND  Result Value Ref Range Status   Specimen Description BLOOD RIGHT HAND  Final   Special Requests   Final    BOTTLES DRAWN AEROBIC ONLY Blood Culture adequate volume   Culture   Final    NO GROWTH 5 DAYS Performed at [facility], [place] [address] [place], [place] [address]    Report Status 01/22/2021  FINAL  Final  Aerobic Culture w Gram Stain (superficial specimen)     Status: None (Preliminary result)   Collection Time: 01/20/21 11:06 AM   Specimen: Wound  Result Value Ref Range Status   Specimen Description WOUND  Final   Special Requests RIGHT FOOT  Final   Gram Stain   Final    RARE WBC PRESENT,BOTH PMN AND MONONUCLEAR RARE GRAM POSITIVE COCCI IN PAIRS    Culture   Final    MODERATE STAPHYLOCOCCUS AUREUS SUSCEPTIBILITIES TO FOLLOW Performed at [facility], [place] [address], [place], [place] [address]    Report Status PENDING  Incomplete  Resp Panel by RT-PCR (Flu A&B, Covid) Nasopharyngeal Swab     Status: None   Collection Time: 01/20/21 12:22 PM   Specimen: Nasopharyngeal Swab; Nasopharyngeal(NP) swabs in vial transport medium  Result Value Ref Range Status   SARS Coronavirus 2 by RT PCR NEGATIVE NEGATIVE Final    Comment: (NOTE) SARS-CoV-2 target nucleic acids are NOT DETECTED.  The SARS-CoV-2 RNA is generally detectable in upper respiratory specimens during the acute phase of infection. The lowest concentration of SARS-CoV-2 viral copies this assay can detect is 138 copies/mL. A negative result does not preclude SARS-Cov-2 infection and should not be used as the sole basis for treatment or other patient management decisions. A negative result may occur with  improper specimen collection/handling, submission of specimen other than nasopharyngeal swab, presence of viral mutation(s) within the areas targeted by this assay, and inadequate number of viral copies(<138 copies/mL). A negative result must be combined with clinical observations, patient history, and epidemiological information. The expected result is Negative.  Fact Sheet for Patients:  EntrepreneurPulse.com.au  Fact Sheet for Healthcare Providers:  IncredibleEmployment.be  This test is no t yet approved or cleared by the [place] FDA and  has been authorized  for detection and/or diagnosis of SARS-CoV-2 by FDA under an Emergency Use Authorization (EUA). This EUA will remain  in effect (meaning this test can be used) for the duration of the COVID-19 declaration under Section 564(b)(1) of the Act, 21 U.S.C.section 360bbb-3(b)(1), unless the authorization is terminated  or revoked sooner.       Influenza A by PCR NEGATIVE NEGATIVE Final   Influenza B by PCR NEGATIVE NEGATIVE Final    Comment: (NOTE) The Xpert Xpress SARS-CoV-2/FLU/RSV plus assay is intended as an aid in the diagnosis of influenza from Nasopharyngeal swab specimens and should not be used as a sole basis for treatment. Nasal washings and aspirates are unacceptable for Xpert Xpress SARS-CoV-2/FLU/RSV testing.  Fact Sheet for Patients: EntrepreneurPulse.com.au  Fact Sheet for Healthcare Providers: IncredibleEmployment.be  This test is not yet approved or cleared by the [place] FDA and has been authorized for detection and/or diagnosis of SARS-CoV-2 by FDA under an Emergency Use Authorization (EUA). This EUA will remain in effect (meaning  this test can be used) for the duration of the COVID-19 declaration under Section 564(b)(1) of the Act, 21 U.S.C. section 360bbb-3(b)(1), unless the authorization is terminated or revoked.  Performed at [facility], [place] [address] [place], [place] [address]     Procedures/Studies: DG Chest 1 View  Result Date: 01/17/2021 CLINICAL DATA:  80 year old male with shortness of breath. Multiple falls. EXAM: CHEST  1 VIEW COMPARISON:  Portable chest 09/08/2020 and earlier. FINDINGS: Portable AP view at 0426 hours. Low lung volumes similar to those in May. Continued patchy and veiling bibasilar lung opacity, new from last year. Associated increased pulmonary vascularity. No pneumothorax. No air bronchograms. Stable cardiac size and mediastinal contours. Visualized tracheal air column is within normal  limits. Multilevel right lateral rib fractures again noted. Paucity of bowel gas in the upper abdomen. IMPRESSION: 1. Lower lung volumes with patchy and veiling bibasilar opacity, diffuse pulmonary vascular congestion. Suspect interstitial edema with bibasilar atelectasis and possible small pleural effusions. These findings are new from last year and unchanged compared to May. 2. Chronic right rib fractures. Electronically Signed   By: Genevie Ann M.D.   On: 01/17/2021 05:27   DG Lumbar Spine Complete  Result Date: 01/17/2021 CLINICAL DATA:  80 year old male with shortness of breath. Multiple falls. EXAM: LUMBAR SPINE - COMPLETE 4+ VIEW COMPARISON:  Lumbar radiographs 11/25/2020. FINDINGS: Normal lumbar segmentation. Stable lumbar lordosis. Stable vertebral height and alignment. Diffuse severe disc space loss and bulky endplate spurring in the lower thoracic and lumbar spine. Widespread lumbar facet hypertrophy. No pars fracture. Visible sacrum and SI joints appear intact. No acute osseous abnormality identified. Bilateral hip arthroplasty. Abdominal Calcified aortic atherosclerosis. Visible bowel-gas pattern is within normal limits. IMPRESSION: 1. Diffuse severe spine degeneration. No acute osseous abnormality identified in the lumbar spine. 2.  Aortic Atherosclerosis (ICD10-I70.0). Electronically Signed   By: Genevie Ann M.D.   On: 01/17/2021 05:24   CT HEAD WO CONTRAST (5MM)  Result Date: 01/16/2021 CLINICAL DATA:  Head trauma. EXAM: CT HEAD WITHOUT CONTRAST TECHNIQUE: Contiguous axial images were obtained from the base of the skull through the vertex without intravenous contrast. COMPARISON:  Sep 07, 2020 FINDINGS: Brain: No evidence of acute infarction, hemorrhage, hydrocephalus, extra-axial collection or mass lesion/mass effect. Severe brain parenchymal volume loss and deep white matter microangiopathy. Vascular: No hyperdense vessel or unexpected calcification. Skull: Normal. Negative for fracture or focal  lesion. Sinuses/Orbits: No acute finding. Other: None. IMPRESSION: 1. No acute intracranial abnormality. 2. Severe brain parenchymal atrophy and chronic microvascular disease. Electronically Signed   By: Fidela Salisbury M.D.   On: 01/16/2021 17:31   CT Cervical Spine Wo Contrast  Result Date: 01/16/2021 CLINICAL DATA:  [place], neck trauma EXAM: CT CERVICAL SPINE WITHOUT CONTRAST TECHNIQUE: Multidetector CT imaging of the cervical spine was performed without intravenous contrast. Multiplanar CT image reconstructions were also generated. COMPARISON:  07/08/2020 FINDINGS: Alignment: Alignment is grossly anatomic and stable. Skull base and vertebrae: No acute fracture. No primary bone lesion or focal pathologic process. Soft tissues and spinal canal: No prevertebral fluid or swelling. No visible canal hematoma. Disc levels: Stable multilevel spondylosis and facet hypertrophy. Disc space narrowing and osteophyte formation are greatest at the C5-6, C6-7, and C7-T1 levels. There is marked facet hypertrophy from C2 through C5. Upper chest: Central airway is patent.  Lung apices are clear. Other: Reconstructed images demonstrate no additional findings. IMPRESSION: 1. No acute cervical spine fracture. 2. Stable multilevel cervical spondylosis and facet hypertrophy. Electronically Signed  By: Randa Ngo M.D.   On: 01/16/2021 19:46   CT Foot Right Wo Contrast  Result Date: 01/17/2021 CLINICAL DATA:  Right foot pain and swelling. EXAM: CT OF THE RIGHT FOOT WITHOUT CONTRAST TECHNIQUE: Multidetector CT imaging of the right foot was performed according to the standard protocol. Multiplanar CT image reconstructions were also generated. COMPARISON:  Right foot x-rays from same day. FINDINGS: Bones/Joint/Cartilage No bony destruction or periosteal reaction. Focal thinning of the cortex along the anterior aspect of the lateral talar dome with underlying marrow lucency, but maintained marrow fat density and overlying soft  tissue fat plane, and therefore likely focal osteopenia. No acute fracture or dislocation. Old healed fracture of the first proximal phalanx. Mild first MTP joint osteoarthritis. Chronic juxta-articular erosions involving the medial first metatarsal head, suggestive of gout. No joint effusion. Ligaments Ligaments are suboptimally evaluated by CT. Muscles and Tendons Grossly intact. Diffuse fatty atrophy of the intrinsic foot muscles. Soft tissue Severe diffuse soft tissue swelling. No fluid collection or hematoma. No soft tissue mass. IMPRESSION: 1. No acute osseous abnormality. No CT evidence of osteomyelitis. 2. Severe diffuse soft tissue swelling, nonspecific, but can be seen in the setting of cellulitis. 3. Chronic juxta-articular erosions involving the medial first metatarsal head, suggestive of gout. Electronically Signed   By: Titus Dubin M.D.   On: 01/17/2021 09:15   DG Foot 2 Views Right  Result Date: 01/17/2021 CLINICAL DATA:  Removal of foreign body. EXAM: RIGHT FOOT - 2 VIEW COMPARISON:  Earlier today at 4:29 a.m. FINDINGS: 6:23 a.m. Degenerative changes about the first metatarsophalangeal joint. Interval removal of the radiopaque foreign body/needle. Diffuse soft tissue swelling again identified. Vascular calcifications. IMPRESSION: Removal of radiopaque foreign body. Soft tissue swelling. Electronically Signed   By: Abigail Miyamoto M.D.   On: 01/17/2021 06:52   DG Foot 2 Views Right  Result Date: 01/17/2021 CLINICAL DATA:  80 year old male with multiple falls. EXAM: RIGHT FOOT - 2 VIEW COMPARISON:  None. FINDINGS: There is a 16 mm linear metallic retained foreign body projecting along the plantar surface of the 2nd and 3rd metatarsal heads, perhaps a needle fragment. No soft tissue gas, but severe soft tissue swelling in the foot at the level of the metatarsals. Calcified peripheral vascular disease at the ankle. Calcaneus is intact with degenerative spurring. No tarsal or metatarsal fracture.  Phalanges appear intact. Intermittent joint degeneration in the foot. No cortical osteolysis. No acute osseous abnormality identified. IMPRESSION: 1. Severe soft tissue swelling. No acute osseous abnormality identified. 2. A 16 mm linear metallic retained foreign body along the plantar surface of the 2nd and 3rd metatarsal heads may be a needle fragment. Electronically Signed   By: Genevie Ann M.D.   On: 01/17/2021 05:22   ECHOCARDIOGRAM COMPLETE  Result Date: 01/17/2021    ECHOCARDIOGRAM REPORT   Patient Name:   THIJS BRUNTON Date of Exam: 01/17/2021 Medical Rec #:  [id number]        Height:       72.0 in Accession #:    [id number]       Weight:       231.5 lb Date of Birth:  [birth year]        BSA:          2.267 m Patient Age:    38 years         BP:           140/61 mmHg Patient Gender: M  HR:           85 bpm. Exam Location:  Inpatient Procedure: 2D Echo, Cardiac Doppler and Color Doppler Indications:    Elevated Troponin  History:        Patient has prior history of Echocardiogram examinations, most                 recent 09/08/2020. Risk Factors:Hypertension.  Sonographer:    Tawnya Crook Referring Phys: [id number] [place] Osgood IMPRESSIONS  1. Left ventricular ejection fraction, by estimation, is 60 to 65%. The left ventricle has normal function. The left ventricle has no regional wall motion abnormalities. There is mild concentric left ventricular hypertrophy. Left ventricular diastolic function could not be evaluated.  2. Right ventricular systolic function is normal. The right ventricular size is normal. There is normal pulmonary artery systolic pressure.  3. Left atrial size was severely dilated.  4. The mitral valve is normal in structure. Trivial mitral valve regurgitation. No evidence of mitral stenosis.  5. The aortic valve is tricuspid. There is mild calcification of the aortic valve. There is mild thickening of the aortic valve. Aortic valve regurgitation is not visualized. Mild aortic valve  stenosis. Aortic valve area, by VTI measures 3.20 cm. Aortic valve mean gradient measures 12.0 mmHg. Aortic valve Vmax measures 2.35 m/s.  6. Aortic dilatation noted. There is mild dilatation of the aortic root, measuring 40 mm.  7. The inferior vena cava is dilated in size with >50% respiratory variability, suggesting right atrial pressure of 8 mmHg. FINDINGS  Left Ventricle: Left ventricular ejection fraction, by estimation, is 60 to 65%. The left ventricle has normal function. The left ventricle has no regional wall motion abnormalities. The left ventricular internal cavity size was normal in size. There is  mild concentric left ventricular hypertrophy. Left ventricular diastolic function could not be evaluated. Right Ventricle: The right ventricular size is normal. No increase in right ventricular wall thickness. Right ventricular systolic function is normal. There is normal pulmonary artery systolic pressure. The tricuspid regurgitant velocity is 1.57 m/s, and  with an assumed right atrial pressure of 8 mmHg, the estimated right ventricular systolic pressure is 63.8 mmHg. Left Atrium: Left atrial size was severely dilated. Right Atrium: Right atrial size was normal in size. Pericardium: There is no evidence of pericardial effusion. Mitral Valve: The mitral valve is normal in structure. Trivial mitral valve regurgitation. No evidence of mitral valve stenosis. Tricuspid Valve: The tricuspid valve is normal in structure. Tricuspid valve regurgitation is not demonstrated. No evidence of tricuspid stenosis. Aortic Valve: The aortic valve is tricuspid. There is mild calcification of the aortic valve. There is mild thickening of the aortic valve. Aortic valve regurgitation is not visualized. Mild aortic stenosis is present. Aortic valve mean gradient measures  12.0 mmHg. Aortic valve peak gradient measures 22.1 mmHg. Aortic valve area, by VTI measures 3.20 cm. Pulmonic Valve: The pulmonic valve was normal in  structure. Pulmonic valve regurgitation is trivial. No evidence of pulmonic stenosis. Aorta: Aortic dilatation noted. There is mild dilatation of the aortic root, measuring 40 mm. Venous: The inferior vena cava is dilated in size with greater than 50% respiratory variability, suggesting right atrial pressure of 8 mmHg. IAS/Shunts: No atrial level shunt detected by color flow Doppler.  LEFT VENTRICLE PLAX 2D LVIDd:         5.30 cm     Diastology LVIDs:         3.50 cm     LV e' medial:  9.79 cm/s LV PW:         1.05 cm     LV e' lateral: 8.16 cm/s LV IVS:        1.30 cm LVOT diam:     2.30 cm LV SV:         160 LV SV Index:   71 LVOT Area:     4.15 cm  LV Volumes (MOD) LV vol d, MOD A4C: 71.9 ml LV vol s, MOD A4C: 26.7 ml LV SV MOD A4C:     71.9 ml RIGHT VENTRICLE             IVC RV S prime:     19.40 cm/s  IVC diam: 3.00 cm TAPSE (M-mode): 2.5 cm LEFT ATRIUM             Index       RIGHT ATRIUM           Index LA diam:        4.60 cm 2.03 cm/m  RA Area:     17.20 cm LA Vol (A2C):   86.6 ml 38.20 ml/m RA Volume:   35.30 ml  15.57 ml/m LA Vol (A4C):   91.7 ml 40.45 ml/m LA Biplane Vol: 90.0 ml 39.70 ml/m  AORTIC VALVE                    PULMONIC VALVE AV Area (Vmax):    3.08 cm     PV Vmax:       0.81 m/s AV Area (Vmean):   3.04 cm     PV Peak grad:  2.6 mmHg AV Area (VTI):     3.20 cm AV Vmax:           235.00 cm/s AV Vmean:          164.000 cm/s AV VTI:            0.501 m AV Peak Grad:      22.1 mmHg AV Mean Grad:      12.0 mmHg LVOT Vmax:         174.00 cm/s LVOT Vmean:        120.000 cm/s LVOT VTI:          0.386 m LVOT/AV VTI ratio: 0.77  AORTA Ao Root diam: 4.00 cm Ao Asc diam:  3.00 cm TRICUSPID VALVE TR Peak grad:   9.9 mmHg TR Vmax:        157.00 cm/s  SHUNTS Systemic VTI:  0.39 m Systemic Diam: 2.30 cm Skeet Latch MD Electronically signed by Skeet Latch MD Signature Date/Time: 01/17/2021/3:04:02 PM    Final    IR Radiologist Eval & Mgmt  Result Date: 12/31/2020 Please refer to notes  tab for details about interventional procedure. (Op Note)   Labs: BNP (last 3 results) Recent Labs    01/09/21 0055 01/16/21 1639 01/19/21 0041  BNP 317.4* 351.8* 063.0*   Basic Metabolic Panel: Recent Labs  Lab 01/18/21 0034 01/19/21 0041 01/20/21 0103 01/21/21 0136 01/22/21 0035  NA 129* 129* 128* 130* 133*  K 3.5 3.6 3.5 3.5 3.6  CL 99 103 101 102 100  CO2 19* 15* 18* 20* 21*  GLUCOSE 113* 102* 104* 101* 102*  BUN 57* 59* 59* 58* 58*  CREATININE 4.17* 3.91* 3.67* 3.46* 3.38*  CALCIUM 8.2* 8.1* 8.4* 8.4* 8.6*  PHOS 3.1  --   --   --   --    Liver Function Tests: Recent Labs  Lab  01/16/21 1633 01/18/21 0034  AST 23  --   ALT 14  --   ALKPHOS 72  --   BILITOT 1.0  --   PROT 6.2*  --   ALBUMIN 3.0* 2.2*   No results for input(s): LIPASE, AMYLASE in the last 168 hours. No results for input(s): AMMONIA in the last 168 hours. CBC: Recent Labs  Lab 01/16/21 1633 01/18/21 0034 01/19/21 0041 01/19/21 1702 01/20/21 0103 01/21/21 0136  WBC 15.7* 11.4* 8.9  --  8.8 6.4  NEUTROABS 14.2*  --   --   --   --   --   HGB 8.6* 7.6* 7.1* 8.0* 8.0* 8.2*  HCT 25.4* 22.1* 20.8* 23.9* 23.7* 23.7*  MCV 94.4 91.7 92.0  --  91.2 89.8  PLT 238 188 185  --  194 205   Cardiac Enzymes: No results for input(s): CKTOTAL, CKMB, CKMBINDEX, TROPONINI in the last 168 hours. BNP: Invalid input(s): POCBNP CBG: No results for input(s): GLUCAP in the last 168 hours. D-Dimer No results for input(s): DDIMER in the last 72 hours. Hgb A1c No results for input(s): HGBA1C in the last 72 hours. Lipid Profile No results for input(s): CHOL, HDL, LDLCALC, TRIG, CHOLHDL, LDLDIRECT in the last 72 hours. Thyroid function studies No results for input(s): TSH, T4TOTAL, T3FREE, THYROIDAB in the last 72 hours.  Invalid input(s): FREET3 Anemia work up No results for input(s): VITAMINB12, FOLATE, FERRITIN, TIBC, IRON, RETICCTPCT in the last 72 hours. Urinalysis    Component Value Date/Time    COLORURINE AMBER (A) 01/17/2021 0425   APPEARANCEUR CLOUDY (A) 01/17/2021 0425   LABSPEC 1.010 01/17/2021 0425   PHURINE 7.0 01/17/2021 0425   GLUCOSEU NEGATIVE 01/17/2021 0425   HGBUR LARGE (A) 01/17/2021 0425   BILIRUBINUR MODERATE (A) 01/17/2021 0425   KETONESUR NEGATIVE 01/17/2021 0425   PROTEINUR >300 (A) 01/17/2021 0425   NITRITE POSITIVE (A) 01/17/2021 0425   LEUKOCYTESUR LARGE (A) 01/17/2021 0425   Sepsis Labs Invalid input(s): PROCALCITONIN,  WBC,  LACTICIDVEN Microbiology Recent Results (from the past 240 hour(s))  Urine Culture     Status: Abnormal   Collection Time: 01/16/21  4:27 AM   Specimen: Urine, Clean Catch  Result Value Ref Range Status   Specimen Description URINE, CLEAN CATCH  Final   Special Requests   Final    NONE Performed at [facility], [place] [address], [place], [place] [address]    Culture >=100,000 COLONIES/mL PROTEUS MIRABILIS (A)  Final   Report Status 01/19/2021 FINAL  Final   Organism ID, Bacteria PROTEUS MIRABILIS (A)  Final      Susceptibility   Proteus mirabilis - MIC*    AMPICILLIN <=2 SENSITIVE Sensitive     CEFAZOLIN <=4 SENSITIVE Sensitive     CEFEPIME <=0.12 SENSITIVE Sensitive     CEFTRIAXONE <=0.25 SENSITIVE Sensitive     CIPROFLOXACIN >=4 RESISTANT Resistant     GENTAMICIN 8 INTERMEDIATE Intermediate     IMIPENEM 2 SENSITIVE Sensitive     NITROFURANTOIN 128 RESISTANT Resistant     TRIMETH/SULFA >=320 RESISTANT Resistant     AMPICILLIN/SULBACTAM <=2 SENSITIVE Sensitive     PIP/TAZO <=4 SENSITIVE Sensitive     * >=100,000 COLONIES/mL PROTEUS MIRABILIS  Resp Panel by RT-PCR (Flu A&B, Covid)     Status: None   Collection Time: 01/17/21  3:52 AM   Specimen: Nasopharyngeal(NP) swabs in vial transport medium  Result Value Ref Range Status   SARS Coronavirus 2 by RT PCR NEGATIVE NEGATIVE Final  Comment: (NOTE) SARS-CoV-2 target nucleic acids are NOT DETECTED.  The SARS-CoV-2 RNA is generally detectable in upper  respiratory specimens during the acute phase of infection. The lowest concentration of SARS-CoV-2 viral copies this assay can detect is 138 copies/mL. A negative result does not preclude SARS-Cov-2 infection and should not be used as the sole basis for treatment or other patient management decisions. A negative result may occur with  improper specimen collection/handling, submission of specimen other than nasopharyngeal swab, presence of viral mutation(s) within the areas targeted by this assay, and inadequate number of viral copies(<138 copies/mL). A negative result must be combined with clinical observations, patient history, and epidemiological information. The expected result is Negative.  Fact Sheet for Patients:  EntrepreneurPulse.com.au  Fact Sheet for Healthcare Providers:  IncredibleEmployment.be  This test is no t yet approved or cleared by the [place] FDA and  has been authorized for detection and/or diagnosis of SARS-CoV-2 by FDA under an Emergency Use Authorization (EUA). This EUA will remain  in effect (meaning this test can be used) for the duration of the COVID-19 declaration under Section 564(b)(1) of the Act, 21 U.S.C.section 360bbb-3(b)(1), unless the authorization is terminated  or revoked sooner.       Influenza A by PCR NEGATIVE NEGATIVE Final   Influenza B by PCR NEGATIVE NEGATIVE Final    Comment: (NOTE) The Xpert Xpress SARS-CoV-2/FLU/RSV plus assay is intended as an aid in the diagnosis of influenza from Nasopharyngeal swab specimens and should not be used as a sole basis for treatment. Nasal washings and aspirates are unacceptable for Xpert Xpress SARS-CoV-2/FLU/RSV testing.  Fact Sheet for Patients: EntrepreneurPulse.com.au  Fact Sheet for Healthcare Providers: IncredibleEmployment.be  This test is not yet approved or cleared by the [place] FDA and has been  authorized for detection and/or diagnosis of SARS-CoV-2 by FDA under an Emergency Use Authorization (EUA). This EUA will remain in effect (meaning this test can be used) for the duration of the COVID-19 declaration under Section 564(b)(1) of the Act, 21 U.S.C. section 360bbb-3(b)(1), unless the authorization is terminated or revoked.  Performed at [facility], [place] [address] [place], [place] [address]   Blood culture (routine x 2)     Status: None   Collection Time: 01/17/21  5:40 AM   Specimen: BLOOD RIGHT ARM  Result Value Ref Range Status   Specimen Description BLOOD RIGHT ARM  Final   Special Requests   Final    BOTTLES DRAWN AEROBIC AND ANAEROBIC Blood Culture adequate volume   Culture   Final    NO GROWTH 5 DAYS Performed at [facility], [place] [address], [place], [place] [address]    Report Status 01/22/2021 FINAL  Final  Blood culture (routine x 2)     Status: None   Collection Time: 01/17/21  5:45 AM   Specimen: BLOOD RIGHT HAND  Result Value Ref Range Status   Specimen Description BLOOD RIGHT HAND  Final   Special Requests   Final    BOTTLES DRAWN AEROBIC ONLY Blood Culture adequate volume   Culture   Final    NO GROWTH 5 DAYS Performed at [facility], [place] [address] [place], [place] [address]    Report Status 01/22/2021 FINAL  Final  Aerobic Culture w Gram Stain (superficial specimen)     Status: None (Preliminary result)   Collection Time: 01/20/21 11:06 AM   Specimen: Wound  Result Value Ref Range Status   Specimen Description WOUND  Final   Special Requests RIGHT FOOT  Final   Gram Stain   Final    RARE WBC PRESENT,BOTH PMN AND MONONUCLEAR RARE GRAM POSITIVE COCCI IN PAIRS    Culture   Final    MODERATE STAPHYLOCOCCUS AUREUS SUSCEPTIBILITIES TO FOLLOW Performed at [facility], [place] [address] [place], [place] [address]    Report Status PENDING  Incomplete  Resp Panel by RT-PCR (Flu A&B, Covid) Nasopharyngeal Swab     Status:  None   Collection Time: 01/20/21 12:22 PM   Specimen: Nasopharyngeal Swab; Nasopharyngeal(NP) swabs in vial transport medium  Result Value Ref Range Status   SARS Coronavirus 2 by RT PCR NEGATIVE NEGATIVE Final    Comment: (NOTE) SARS-CoV-2 target nucleic acids are NOT DETECTED.  The SARS-CoV-2 RNA is generally detectable in upper respiratory specimens during the acute phase of infection. The lowest concentration of SARS-CoV-2 viral copies this assay can detect is 138 copies/mL. A negative result does not preclude SARS-Cov-2 infection and should not be used as the sole basis for treatment or other patient management decisions. A negative result may occur with  improper specimen collection/handling, submission of specimen other than nasopharyngeal swab, presence of viral mutation(s) within the areas targeted by this assay, and inadequate number of viral copies(<138 copies/mL). A negative result must be combined with clinical observations, patient history, and epidemiological information. The expected result is Negative.  Fact Sheet for Patients:  EntrepreneurPulse.com.au  Fact Sheet for Healthcare Providers:  IncredibleEmployment.be  This test is no t yet approved or cleared by the [place] FDA and  has been authorized for detection and/or diagnosis of SARS-CoV-2 by FDA under an Emergency Use Authorization (EUA). This EUA will remain  in effect (meaning this test can be used) for the duration of the COVID-19 declaration under Section 564(b)(1) of the Act, 21 U.S.C.section 360bbb-3(b)(1), unless the authorization is terminated  or revoked sooner.       Influenza A by PCR NEGATIVE NEGATIVE Final   Influenza B by PCR NEGATIVE NEGATIVE Final    Comment: (NOTE) The Xpert Xpress SARS-CoV-2/FLU/RSV plus assay is intended as an aid in the diagnosis of influenza from Nasopharyngeal swab specimens and should not be used as a sole basis for  treatment. Nasal washings and aspirates are unacceptable for Xpert Xpress SARS-CoV-2/FLU/RSV testing.  Fact Sheet for Patients: EntrepreneurPulse.com.au  Fact Sheet for Healthcare Providers: IncredibleEmployment.be  This test is not yet approved or cleared by the [place] FDA and has been authorized for detection and/or diagnosis of SARS-CoV-2 by FDA under an Emergency Use Authorization (EUA). This EUA will remain in effect (meaning this test can be used) for the duration of the COVID-19 declaration under Section 564(b)(1) of the Act, 21 U.S.C. section 360bbb-3(b)(1), unless the authorization is terminated or revoked.  Performed at [facility], [place] [address], [place], [place] [address]      Time coordinating discharge:  35 minutes  SIGNED: Antonieta Pert, MD  [facility] 01/22/2021, 11:49 AM  If 7PM-7AM, please contact night-coverage [email or web address]

## 2021-01-22 NOTE — Progress Notes (Signed)
Report called to Dentist at [facility].

## 2021-01-28 DIAGNOSIS — D649 Anemia, unspecified: Secondary | ICD-10-CM | POA: Diagnosis not present

## 2021-01-28 DIAGNOSIS — I480 Paroxysmal atrial fibrillation: Secondary | ICD-10-CM | POA: Diagnosis not present

## 2021-01-28 DIAGNOSIS — I5042 Chronic combined systolic (congestive) and diastolic (congestive) heart failure: Secondary | ICD-10-CM | POA: Diagnosis not present

## 2021-01-28 DIAGNOSIS — I10 Essential (primary) hypertension: Secondary | ICD-10-CM | POA: Diagnosis not present

## 2021-01-28 DIAGNOSIS — E785 Hyperlipidemia, unspecified: Secondary | ICD-10-CM | POA: Diagnosis not present

## 2021-01-28 DIAGNOSIS — I482 Chronic atrial fibrillation, unspecified: Secondary | ICD-10-CM | POA: Diagnosis not present

## 2021-01-28 DIAGNOSIS — S91309A Unspecified open wound, unspecified foot, initial encounter: Secondary | ICD-10-CM | POA: Diagnosis not present

## 2021-02-01 DIAGNOSIS — R944 Abnormal results of kidney function studies: Secondary | ICD-10-CM | POA: Diagnosis not present

## 2021-02-01 DIAGNOSIS — N179 Acute kidney failure, unspecified: Secondary | ICD-10-CM | POA: Diagnosis not present

## 2021-02-01 DIAGNOSIS — N184 Chronic kidney disease, stage 4 (severe): Secondary | ICD-10-CM | POA: Diagnosis not present

## 2021-02-01 DIAGNOSIS — Z7689 Persons encountering health services in other specified circumstances: Secondary | ICD-10-CM | POA: Diagnosis not present

## 2021-02-02 DIAGNOSIS — I5042 Chronic combined systolic (congestive) and diastolic (congestive) heart failure: Secondary | ICD-10-CM | POA: Diagnosis not present

## 2021-02-02 DIAGNOSIS — N179 Acute kidney failure, unspecified: Secondary | ICD-10-CM | POA: Diagnosis not present

## 2021-02-02 DIAGNOSIS — N184 Chronic kidney disease, stage 4 (severe): Secondary | ICD-10-CM | POA: Diagnosis not present

## 2021-02-02 NOTE — Progress Notes (Signed)
Cardiology Office Note:    Date:  02/03/2021   ID:  Darryl Diaz, DOB Dec 25, 1940, MRN [id number]  PCP:  [name], MD   [facility] Providers Cardiologist:  Sherren Mocha, MD Electrophysiologist:  Virl Axe, MD     Referring MD: [name], MD   Chief Complaint:  F/u for AFib, CHF, tachy-brad and Hospitalization Follow-up    Patient Profile:   Darryl Diaz is a 80 y.o. male with:  Paroxysmal atrial fibrillation  A/c held in 08/2018 due to posthemorrhagic anemia 2/2 fall Amio started during admit w MW tear, AF w RVR in 02/2019>>DCd due to brady in 04/2019 Admx 9/21 w syncope, AF w RVR>>DCCV>>ERAF Flecainide >> DCCV>>ERAF >>Amiodarone (CCB, BB DCd) Not a candidate for Sotalol due to brady; Dofetilide due to long QT Will need PPM if recurrent brady; although not ideal candidate due to infection/wound care issues  Admx 11/21: AF w RVR in setting of Salmonella gastroenteritis >> Amiodarone adjusted>>NSR anticoagulation DCd in 9/22 due to anemia 2/2 GI bleeding; frailty [Pt not a candidate for LAAOD] Tachy-Brady syndrome Symptomatic bradycardia admx 04/2019 >> Amiodarone, Diltiazem, Metoprolol DC'd Eval by EP 1/21 (Dr. 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 Joelyn Oms  Hypertension  Hyperlipidemia  Hx of UGI bleed s/p MW tear in 02/2019 Hx of ETOH abuse Hepatic cirrhosis  Sleep apnea  Admitted w salmonella gastroenteritis in 11/21 c/b AKI, AF w RVR, falls Frequent falls  Orthostatic hypotension Midodrine Rx  Hx of TIA in 5/22 Chronic anemia Hx of Lower GI bleed in 9/22 DNR   Prior CV studies: Echocardiogram 01/17/21 EF 60-65, no RWMA, mild LVH, normal RVSF, severe LAE, trivial MR, mild aortic stenosis (mean gradient 92mmHg,  V-max 235 cm/s), mild dilation of aortic root (40 mm)  Echocardiogram 09/08/20 EF 60-65, no RWMA, mild LVH, G1 DD, normal RVSF, mild LAE, mild AV sclerosis, dilation of aortic root (41 mm)  Event monitor 04/2020 The basic rhythm is normal sinus with an average HR of 74 bpm No atrial fibrillation or flutter No high-grade heart block or pathologic pauses There are occasional PVC's and rare supraventricular beats without sustained arrhythmias   Myoview 02/23/20  Normal EF, normal perfusion; low risk    Limited Echocardiogram 02/23/20 EF 60-65, no RWMA, apical RV HK, normal RVSF, mild LAE, trivial MR, dilated aortic root (42 mm), dilated ascending aorta (39 mm)   Echocardiogram 02/06/20 EF 60-65, no RWMA, mild hyperkinesis of RV apex, low normal RVSF, mild LAE, mild eccentric MR, mild to mod AV sclerosis w/o AS, asc Aorta 40 mm, Ao root 39 , RVSP 36.5  History of Present Illness: 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  He was last seen by Dr. Burt Knack in 3/22.  He has been admitted x 5 since then.  He was admitted in 5/22 with AKI on chronic kidney disease and symptoms of a TIA with LLE weakness.  Workup for stroke was unremarkable.  He was admitted in 6/22 with L foot cellulitis.  He was admitted in 7/22 with worsening anemia with Hgb of 6 requiring transfusion with PRBCs x 2.  Hemoccults were neg and it was thought his anemia was related to chronic disease.  He was admitted again in 8/22 with symptomatic anemia in the  setting of LGI bleed.  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   He was seen by Dr. Burt Knack and the decision was made to DC anticoagulation due to bleeding risk and frailty.  He is not felt to be a candidate for LAAOD ([place]).  He was DC to SNF.  He was admitted again 9/10-9/16 with staph cellulitis of his RLL.  His diuretics were held and he was gently hydrated due to AKI on chronic kidney disease.  He required 1 u PRBCs for  worsening anemia.  He had minimal elevation in hs-Trop.  This was felt to be demand ischemia.  He has at least 1 EKG during that admission demonstrating recurrent AF.  F/u EKGs show NSR.    He returns for f/u.  He lives at [facility].  He is currently in SNF.  He tells me that he is to be discharged back to independent living today.  He has not had chest pain, significant shortness of breath, orthopnea.  He has chronic leg edema.  Overall, this is improved.  He has not had syncope.  He has not had melena or hematochezia.    Past Medical History:  Diagnosis Date   Alcohol abuse 10/20/4313   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[place]) 03/17/2020   Alcoholism ([place])    in remission following wife's death   Anemia    Ascending aorta dilation (HCC)    Cardiac arrest ([place]) 04/20/2019   12 min CPR with epi   Chronic diastolic CHF (congestive heart failure) (HCC)    Fatty liver    Gastritis    GERD (gastroesophageal reflux disease)    H/O seasonal allergies    History of GI bleed    Hx of adenomatous colonic polyps 08/10/2017   Hyperlipidemia    Hypertension    Insomnia    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   Mallory-Weiss tear    OA (osteoarthritis)    hands   OSA (obstructive sleep apnea)    No longer uses CPAP   Persistent atrial fibrillation with rapid ventricular response ([place]) 02/05/2020   Recurrent syncope    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(HCC)    Current Medications: Current Meds  Medication Sig   acetaminophen (TYLENOL) 325 MG tablet Take 2 tablets (650 mg total) by mouth every 4 (four) hours as needed for headache or mild pain.   amiodarone (PACERONE) 200 MG tablet Take 1 tablet (200 mg total) by mouth daily.   atorvastatin (LIPITOR) 20 MG tablet Take 1 tablet (20 mg total) by mouth daily.   cefadroxil (DURICEF) 500 MG capsule Take 500 mg by mouth 2 (two) times daily.   doxycycline (VIBRAMYCIN) 100 MG capsule Take 100 mg by mouth 2 (two) times daily.    escitalopram (LEXAPRO) 10 MG tablet Take 1 tablet (10 mg total) by mouth daily.   ferrous sulfate 325 (65 FE) MG tablet Take 1 tablet (325 mg total) by mouth daily.   folic acid (FOLVITE) 1 MG tablet Take 1 tablet (1 mg total) by mouth daily.   ketoconazole (NIZORAL) 2 % cream Apply 1 application topically daily. To dry scaly skin on feet   loratadine (CLARITIN) 10 MG tablet Take 1 tablet (10 mg total) by mouth daily.   Magnesium Oxide 400 MG CAPS Take 1 capsule (400 mg total) by mouth daily.   Multiple Vitamins-Minerals (CENTRUM SILVER 50+MEN) TABS Take 1 tablet by mouth daily with breakfast.   nitroGLYCERIN (NITROSTAT) 0.4 MG SL tablet Place  1 tablet (0.4 mg total) under the tongue every 5 (five) minutes x 3 doses as needed for chest pain.   nystatin (MYCOSTATIN/NYSTOP) powder Apply topically 2 (two) times daily.   pantoprazole (PROTONIX) 40 MG tablet Take 1 tablet (40 mg total) by mouth daily.   potassium chloride SA (KLOR-CON) 20 MEQ tablet Take 1 tablet (20 mEq total) by mouth daily.   sodium bicarbonate 650 MG tablet Take 1 tablet (650 mg total) by mouth 2 (two) times daily.   torsemide 40 MG TABS Take 40 mg by mouth daily.   traZODone (DESYREL) 50 MG tablet Take 1 tablet (50 mg total) by mouth at bedtime.    Allergies:   Penicillin g sodium, Penicillins, and Penicillins   Social History   Tobacco Use   Smoking status: Former    Packs/day: 1.00    Years: 20.00    Pack years: 20.00    Types: Cigarettes   Smokeless tobacco: Never   Tobacco comments:       quit 20 years  smoked on and off for 20 years  Vaping Use   Vaping Use: Never used  Substance Use Topics   Alcohol use: Not Currently    Comment: h/o heavy use   Drug use: Never    Family Hx: The patient's family history includes Arthritis in his mother; Heart disease (age of onset: 58) in his mother; Heart failure in his sister; Heart failure (age of onset: 14) in his father; Hypertension in his mother; Stroke in his  maternal aunt. There is no history of Colon cancer, Esophageal cancer, Stomach cancer, or Rectal cancer.  Review of Systems  Gastrointestinal:  Negative for hematochezia and melena.  Genitourinary:  Negative for hematuria.    EKGs/Labs/Other Test Reviewed:    EKG:  EKG is   ordered today.  The ekg ordered today demonstrates NSR, HR 67, leftward axis, nonspecific ST-T wave changes, QTC 496  Recent Labs: 06/03/2020: TSH 4.450 01/09/2021: Magnesium 2.0 01/16/2021: ALT 14 01/19/2021: B Natriuretic Peptide 607.4 01/21/2021: Hemoglobin 8.2; Platelets 205 01/22/2021: BUN 58; Creatinine, Ser 3.38; Potassium 3.6; Sodium 133   Recent Lipid Panel Lab Results  Component Value Date/Time   CHOL 109 09/07/2020 09:42 PM   TRIG 40 09/07/2020 09:42 PM   HDL 55 09/07/2020 09:42 PM   LDLCALC 46 09/07/2020 09:42 PM     Risk Assessment/Calculations:    CHA2DS2-VASc Score = 4   This indicates a 4.8% annual risk of stroke. The patient's score is based upon: CHF History: 1 HTN History: 1 Diabetes History: 0 Stroke History: 0 Vascular Disease History: 0 Age Score: 2 Gender Score: 0         Physical Exam:    VS:  BP (!) 174/62   Pulse 67   Wt 218 lb (98.9 kg)   SpO2 98%   BMI 29.57 kg/m     Wt Readings from Last 3 Encounters:  02/03/21 218 lb (98.9 kg)  01/17/21 231 lb 7.7 oz (105 kg)  01/06/21 238 lb 1.6 oz (108 kg)    Constitutional:      Appearance: Healthy appearance. Not in distress.  Neck:     Vascular: No JVR.  Pulmonary:     Effort: Pulmonary effort is normal.     Breath sounds: No wheezing. No rales.  Cardiovascular:     Normal rate. Regular rhythm. Normal S1. Normal S2.      Murmurs: There is no murmur.  Edema:    Peripheral edema present.  Pretibial: bilateral 2+ edema of the pretibial area.    Feet: bilateral 2+ edema of the feet. Abdominal:     Palpations: Abdomen is soft.  Skin:    General: Skin is warm and dry.  Neurological:     General: No focal deficit  present.     Mental Status: Alert and oriented to person, place and time.     Cranial Nerves: Cranial nerves are intact.       ASSESSMENT & PLAN:   1. Paroxysmal atrial fibrillation (HCC) 2. Tachycardia-bradycardia syndrome ([place]) He is maintaining normal sinus rhythm.  He has not had any syncope or near syncope to suggest significant bradycardia.  Continue amiodarone 200 mg daily.  Recent ALT normal.  I will obtain a follow-up TSH today.  Follow-up in 4 months.  3. Chronic heart failure with preserved ejection fraction (HCC) EF 60-65 by echocardiogram 9/22.  His lungs are clear.  His breathing is stable.  His weights are down.  He does have significant leg edema.  I suspect that this is related to his chronic kidney disease and anemia as well as deconditioning.  He did request labs from [place].  His creatinine on 02/02/2021 was 5.3.  He is followed by nephrology.  He remains on torsemide 40 mg a day.  I will not make any adjustments in his diuretics and will leave this up to his nephrologist.  4. CKD (chronic kidney disease) stage 4, GFR 15-29 ml/min (HCC) As noted, recent creatinine was 5.3.  He is managed by Dr. Joelyn Oms with nephrology.  5. Iron deficiency anemia due to chronic blood loss Hemoglobin 01/29/2022 was 9.1.  He is now off of anticoagulation secondary to bleeding risk.  6. Hypertension Blood pressure elevated today.  However, I did request blood pressure readings from [facility].  His blood pressure readings over the past couple weeks have been optimal.  Continue current therapy.   Dispo:  Return in about 4 months (around 06/05/2021) for Routine follow up in 4 months with Dr. Burt Knack or Richardson Dopp, PA-C..   Medication Adjustments/Labs and Tests Ordered: Current medicines are reviewed at length with the patient today.  Concerns regarding medicines are outlined above.  Tests Ordered: Orders Placed This Encounter  Procedures   TSH   EKG 12-Lead   Medication Changes: No  orders of the defined types were placed in this encounter.  Signed, Richardson Dopp, PA-C  02/03/2021 10:37 AM    [facility] [address], [place], [place]  [address] Phone: [phone]; Fax: [phone]

## 2021-02-03 ENCOUNTER — Encounter: Payer: Self-pay | Admitting: Physician Assistant

## 2021-02-03 ENCOUNTER — Other Ambulatory Visit: Payer: Self-pay

## 2021-02-03 ENCOUNTER — Ambulatory Visit (INDEPENDENT_AMBULATORY_CARE_PROVIDER_SITE_OTHER): Payer: Medicare Other | Admitting: Physician Assistant

## 2021-02-03 VITALS — BP 174/62 | HR 67 | Wt 218.0 lb

## 2021-02-03 DIAGNOSIS — D500 Iron deficiency anemia secondary to blood loss (chronic): Secondary | ICD-10-CM | POA: Diagnosis not present

## 2021-02-03 DIAGNOSIS — N184 Chronic kidney disease, stage 4 (severe): Secondary | ICD-10-CM | POA: Diagnosis not present

## 2021-02-03 DIAGNOSIS — I480 Paroxysmal atrial fibrillation: Secondary | ICD-10-CM | POA: Diagnosis not present

## 2021-02-03 DIAGNOSIS — I5032 Chronic diastolic (congestive) heart failure: Secondary | ICD-10-CM

## 2021-02-03 DIAGNOSIS — I495 Sick sinus syndrome: Secondary | ICD-10-CM | POA: Diagnosis not present

## 2021-02-03 DIAGNOSIS — I951 Orthostatic hypotension: Secondary | ICD-10-CM

## 2021-02-03 NOTE — Patient Instructions (Addendum)
  Medication Instructions:  Your physician recommends that you continue on your current medications as directed. Please refer to the Current Medication list given to you today.  *If you need a refill on your cardiac medications before your next appointment, please call your pharmacy*   Lab Work: TODAY!!!!! TSH If you have labs (blood work) drawn today and your tests are completely normal, you will receive your results only by: [place] (if you have MyChart) OR A paper copy in the mail If you have any lab test that is abnormal or we need to change your treatment, we will call you to review the results.   Testing/Procedures:  -NONE  Follow-Up: At [facility], you and your health needs are our priority.  As part of our continuing mission to provide you with exceptional heart care, we have created designated Provider Care Teams.  These Care Teams include your primary Cardiologist (physician) and Advanced Practice Providers (APPs -  Physician Assistants and Nurse Practitioners) who all work together to provide you with the care you need, when you need it.  We recommend signing up for the patient portal called "MyChart".  Sign up information is provided on this After Visit Summary.  MyChart is used to connect with patients for Virtual Visits (Telemedicine).  Patients are able to view lab/test results, encounter notes, upcoming appointments, etc.  Non-urgent messages can be sent to your provider as well.   To learn more about what you can do with MyChart, go to [email or web address].    Your next appointment:   4 month(s)  The format for your next appointment:   In Person  Provider:   You may see Sherren Mocha, MD or one of the following Advanced Practice Providers on your designated Care Team:   Richardson Dopp, [place]

## 2021-02-04 DIAGNOSIS — I129 Hypertensive chronic kidney disease with stage 1 through stage 4 chronic kidney disease, or unspecified chronic kidney disease: Secondary | ICD-10-CM | POA: Diagnosis not present

## 2021-02-04 DIAGNOSIS — D631 Anemia in chronic kidney disease: Secondary | ICD-10-CM | POA: Diagnosis not present

## 2021-02-04 DIAGNOSIS — R809 Proteinuria, unspecified: Secondary | ICD-10-CM | POA: Diagnosis not present

## 2021-02-04 DIAGNOSIS — N179 Acute kidney failure, unspecified: Secondary | ICD-10-CM | POA: Diagnosis not present

## 2021-02-04 DIAGNOSIS — N189 Chronic kidney disease, unspecified: Secondary | ICD-10-CM | POA: Diagnosis not present

## 2021-02-04 DIAGNOSIS — N057 Unspecified nephritic syndrome with diffuse crescentic glomerulonephritis: Secondary | ICD-10-CM | POA: Diagnosis not present

## 2021-02-04 DIAGNOSIS — R319 Hematuria, unspecified: Secondary | ICD-10-CM | POA: Diagnosis not present

## 2021-02-04 DIAGNOSIS — L03116 Cellulitis of left lower limb: Secondary | ICD-10-CM | POA: Diagnosis not present

## 2021-02-04 DIAGNOSIS — N058 Unspecified nephritic syndrome with other morphologic changes: Secondary | ICD-10-CM | POA: Diagnosis not present

## 2021-02-04 DIAGNOSIS — R600 Localized edema: Secondary | ICD-10-CM | POA: Diagnosis not present

## 2021-02-04 LAB — TSH: TSH: 6.49 u[IU]/mL — ABNORMAL HIGH (ref 0.450–4.500)

## 2021-02-05 ENCOUNTER — Other Ambulatory Visit: Payer: Self-pay | Admitting: *Deleted

## 2021-02-05 DIAGNOSIS — I10 Essential (primary) hypertension: Secondary | ICD-10-CM | POA: Diagnosis not present

## 2021-02-05 DIAGNOSIS — I4891 Unspecified atrial fibrillation: Secondary | ICD-10-CM | POA: Diagnosis not present

## 2021-02-05 DIAGNOSIS — E7800 Pure hypercholesterolemia, unspecified: Secondary | ICD-10-CM | POA: Diagnosis not present

## 2021-02-05 DIAGNOSIS — E0590 Thyrotoxicosis, unspecified without thyrotoxic crisis or storm: Secondary | ICD-10-CM

## 2021-02-05 DIAGNOSIS — I5032 Chronic diastolic (congestive) heart failure: Secondary | ICD-10-CM | POA: Diagnosis not present

## 2021-02-06 DIAGNOSIS — N186 End stage renal disease: Secondary | ICD-10-CM | POA: Diagnosis not present

## 2021-02-06 DIAGNOSIS — Z992 Dependence on renal dialysis: Secondary | ICD-10-CM | POA: Diagnosis not present

## 2021-02-06 DIAGNOSIS — I129 Hypertensive chronic kidney disease with stage 1 through stage 4 chronic kidney disease, or unspecified chronic kidney disease: Secondary | ICD-10-CM | POA: Diagnosis not present

## 2021-02-10 DIAGNOSIS — I120 Hypertensive chronic kidney disease with stage 5 chronic kidney disease or end stage renal disease: Secondary | ICD-10-CM | POA: Diagnosis not present

## 2021-02-10 DIAGNOSIS — D631 Anemia in chronic kidney disease: Secondary | ICD-10-CM | POA: Diagnosis not present

## 2021-02-10 DIAGNOSIS — N058 Unspecified nephritic syndrome with other morphologic changes: Secondary | ICD-10-CM | POA: Diagnosis not present

## 2021-02-10 DIAGNOSIS — N189 Chronic kidney disease, unspecified: Secondary | ICD-10-CM | POA: Diagnosis not present

## 2021-02-10 DIAGNOSIS — R319 Hematuria, unspecified: Secondary | ICD-10-CM | POA: Diagnosis not present

## 2021-02-10 DIAGNOSIS — R600 Localized edema: Secondary | ICD-10-CM | POA: Diagnosis not present

## 2021-02-10 DIAGNOSIS — L03116 Cellulitis of left lower limb: Secondary | ICD-10-CM | POA: Diagnosis not present

## 2021-02-10 DIAGNOSIS — N185 Chronic kidney disease, stage 5: Secondary | ICD-10-CM | POA: Diagnosis not present

## 2021-02-10 DIAGNOSIS — R809 Proteinuria, unspecified: Secondary | ICD-10-CM | POA: Diagnosis not present

## 2021-02-10 DIAGNOSIS — N057 Unspecified nephritic syndrome with diffuse crescentic glomerulonephritis: Secondary | ICD-10-CM | POA: Diagnosis not present

## 2021-02-10 DIAGNOSIS — Z23 Encounter for immunization: Secondary | ICD-10-CM | POA: Diagnosis not present

## 2021-02-11 ENCOUNTER — Telehealth: Payer: Self-pay | Admitting: Cardiovascular Disease

## 2021-02-11 MED ORDER — CARVEDILOL 6.25 MG PO TABS: 6.2500 mg | ORAL_TABLET | Freq: Two times a day (BID) | ORAL | 3 refills | Status: DC

## 2021-02-11 NOTE — Telephone Encounter (Signed)
Melvenia Beam the Pharm D with Dr. Osborne Casco called to report that she saw the pt 02/08/21 and his BP has been elevated... 198/90 and 190/90...   She is asking if she can put the pt back on Coreg, she thinks there was an error when he was D/C from the hospital that he was suppose to stay on it and there was an error...   His Kidney functioning is worsening he is seeing Nephrologist Dr.Ryan Joelyn Oms and his recent BMET showed worsening kidney functioning and the pt is going to eventually be on dialysis..   BUN 87  CREAT 6.08 GFR 9  He is seeing Nephr again next week.   She also reports that he was on Torsemide 60 mg and Lasix 40 mg daily and should not have been on bohyt as a results of his hops D/C 01/2021... she has now D/C the Lasix and decreased the Torsemide to 40 mg as his original dose.   She is asking what dose we would like the pt to be back on for the Coreg.   She asked for [place] to call her back and to call the pt.   ADDENDUM:  I called the pt to see how he is doing and he reports that he is feeling well and only has an occasional headache but gets better with Tylenol.. he lives at [facility] and advised him that I will talk with Dr. Nicoletta Dress D about his meds and call him back later this afternoon.

## 2021-02-11 NOTE — Telephone Encounter (Signed)
After talking with our Pharm D, Melissa.Melvenia Beam and the pt advised that he should start back on the Coreg 6.25 mg po bid... pt agreed and will keep track of his BP at [facility] where he resides.

## 2021-02-11 NOTE — Telephone Encounter (Signed)
  Melvenia Beam pharmD with [facility] calling, she said, she saw pt on 10/03 and pt had BP 198/90 then 190/90 after recheck. Pt is not taking carvedilol. Question: if pt need to restart carvedilol or pt needs additional meds or different meds.  Also, Pt is taking furosemide 40 mg daily and torsemide 60 mg daily. Kidney function gone down significantly and she thinks its because too much diuretics.

## 2021-02-12 ENCOUNTER — Other Ambulatory Visit (HOSPITAL_COMMUNITY): Payer: Self-pay | Admitting: Interventional Radiology

## 2021-02-12 DIAGNOSIS — L97529 Non-pressure chronic ulcer of other part of left foot with unspecified severity: Secondary | ICD-10-CM

## 2021-02-17 DIAGNOSIS — Z992 Dependence on renal dialysis: Secondary | ICD-10-CM | POA: Diagnosis not present

## 2021-02-17 DIAGNOSIS — N186 End stage renal disease: Secondary | ICD-10-CM | POA: Diagnosis not present

## 2021-02-19 DIAGNOSIS — D509 Iron deficiency anemia, unspecified: Secondary | ICD-10-CM | POA: Diagnosis not present

## 2021-02-19 DIAGNOSIS — Z992 Dependence on renal dialysis: Secondary | ICD-10-CM | POA: Diagnosis not present

## 2021-02-19 DIAGNOSIS — N186 End stage renal disease: Secondary | ICD-10-CM | POA: Diagnosis not present

## 2021-02-19 DIAGNOSIS — N2581 Secondary hyperparathyroidism of renal origin: Secondary | ICD-10-CM | POA: Diagnosis not present

## 2021-02-19 DIAGNOSIS — D631 Anemia in chronic kidney disease: Secondary | ICD-10-CM | POA: Diagnosis not present

## 2021-02-22 DIAGNOSIS — D631 Anemia in chronic kidney disease: Secondary | ICD-10-CM | POA: Diagnosis not present

## 2021-02-22 DIAGNOSIS — N186 End stage renal disease: Secondary | ICD-10-CM | POA: Diagnosis not present

## 2021-02-22 DIAGNOSIS — Z992 Dependence on renal dialysis: Secondary | ICD-10-CM | POA: Diagnosis not present

## 2021-02-22 DIAGNOSIS — D509 Iron deficiency anemia, unspecified: Secondary | ICD-10-CM | POA: Diagnosis not present

## 2021-02-22 DIAGNOSIS — N2581 Secondary hyperparathyroidism of renal origin: Secondary | ICD-10-CM | POA: Diagnosis not present

## 2021-02-24 DIAGNOSIS — D509 Iron deficiency anemia, unspecified: Secondary | ICD-10-CM | POA: Diagnosis not present

## 2021-02-24 DIAGNOSIS — N186 End stage renal disease: Secondary | ICD-10-CM | POA: Diagnosis not present

## 2021-02-24 DIAGNOSIS — Z992 Dependence on renal dialysis: Secondary | ICD-10-CM | POA: Diagnosis not present

## 2021-02-24 DIAGNOSIS — D631 Anemia in chronic kidney disease: Secondary | ICD-10-CM | POA: Diagnosis not present

## 2021-02-24 DIAGNOSIS — N2581 Secondary hyperparathyroidism of renal origin: Secondary | ICD-10-CM | POA: Diagnosis not present

## 2021-02-25 ENCOUNTER — Other Ambulatory Visit: Payer: PRIVATE HEALTH INSURANCE

## 2021-02-26 DIAGNOSIS — D509 Iron deficiency anemia, unspecified: Secondary | ICD-10-CM | POA: Diagnosis not present

## 2021-02-26 DIAGNOSIS — N2581 Secondary hyperparathyroidism of renal origin: Secondary | ICD-10-CM | POA: Diagnosis not present

## 2021-02-26 DIAGNOSIS — D631 Anemia in chronic kidney disease: Secondary | ICD-10-CM | POA: Diagnosis not present

## 2021-02-26 DIAGNOSIS — N186 End stage renal disease: Secondary | ICD-10-CM | POA: Diagnosis not present

## 2021-02-26 DIAGNOSIS — Z992 Dependence on renal dialysis: Secondary | ICD-10-CM | POA: Diagnosis not present

## 2021-03-01 DIAGNOSIS — D631 Anemia in chronic kidney disease: Secondary | ICD-10-CM | POA: Diagnosis not present

## 2021-03-01 DIAGNOSIS — N2581 Secondary hyperparathyroidism of renal origin: Secondary | ICD-10-CM | POA: Diagnosis not present

## 2021-03-01 DIAGNOSIS — D509 Iron deficiency anemia, unspecified: Secondary | ICD-10-CM | POA: Diagnosis not present

## 2021-03-01 DIAGNOSIS — N186 End stage renal disease: Secondary | ICD-10-CM | POA: Diagnosis not present

## 2021-03-01 DIAGNOSIS — Z992 Dependence on renal dialysis: Secondary | ICD-10-CM | POA: Diagnosis not present

## 2021-03-03 DIAGNOSIS — N186 End stage renal disease: Secondary | ICD-10-CM | POA: Diagnosis not present

## 2021-03-03 DIAGNOSIS — D631 Anemia in chronic kidney disease: Secondary | ICD-10-CM | POA: Diagnosis not present

## 2021-03-03 DIAGNOSIS — Z992 Dependence on renal dialysis: Secondary | ICD-10-CM | POA: Diagnosis not present

## 2021-03-03 DIAGNOSIS — D509 Iron deficiency anemia, unspecified: Secondary | ICD-10-CM | POA: Diagnosis not present

## 2021-03-03 DIAGNOSIS — N2581 Secondary hyperparathyroidism of renal origin: Secondary | ICD-10-CM | POA: Diagnosis not present

## 2021-03-04 ENCOUNTER — Other Ambulatory Visit: Payer: PRIVATE HEALTH INSURANCE

## 2021-03-05 DIAGNOSIS — Z992 Dependence on renal dialysis: Secondary | ICD-10-CM | POA: Diagnosis not present

## 2021-03-05 DIAGNOSIS — N2581 Secondary hyperparathyroidism of renal origin: Secondary | ICD-10-CM | POA: Diagnosis not present

## 2021-03-05 DIAGNOSIS — D631 Anemia in chronic kidney disease: Secondary | ICD-10-CM | POA: Diagnosis not present

## 2021-03-05 DIAGNOSIS — D509 Iron deficiency anemia, unspecified: Secondary | ICD-10-CM | POA: Diagnosis not present

## 2021-03-05 DIAGNOSIS — N186 End stage renal disease: Secondary | ICD-10-CM | POA: Diagnosis not present

## 2021-03-08 DIAGNOSIS — I5032 Chronic diastolic (congestive) heart failure: Secondary | ICD-10-CM | POA: Diagnosis not present

## 2021-03-08 DIAGNOSIS — D509 Iron deficiency anemia, unspecified: Secondary | ICD-10-CM | POA: Diagnosis not present

## 2021-03-08 DIAGNOSIS — I10 Essential (primary) hypertension: Secondary | ICD-10-CM | POA: Diagnosis not present

## 2021-03-08 DIAGNOSIS — E7800 Pure hypercholesterolemia, unspecified: Secondary | ICD-10-CM | POA: Diagnosis not present

## 2021-03-08 DIAGNOSIS — I4891 Unspecified atrial fibrillation: Secondary | ICD-10-CM | POA: Diagnosis not present

## 2021-03-08 DIAGNOSIS — Z992 Dependence on renal dialysis: Secondary | ICD-10-CM | POA: Diagnosis not present

## 2021-03-08 DIAGNOSIS — N186 End stage renal disease: Secondary | ICD-10-CM | POA: Diagnosis not present

## 2021-03-08 DIAGNOSIS — D631 Anemia in chronic kidney disease: Secondary | ICD-10-CM | POA: Diagnosis not present

## 2021-03-08 DIAGNOSIS — N2581 Secondary hyperparathyroidism of renal origin: Secondary | ICD-10-CM | POA: Diagnosis not present

## 2021-03-09 ENCOUNTER — Other Ambulatory Visit: Payer: Self-pay | Admitting: *Deleted

## 2021-03-09 DIAGNOSIS — N186 End stage renal disease: Secondary | ICD-10-CM | POA: Diagnosis not present

## 2021-03-09 DIAGNOSIS — I129 Hypertensive chronic kidney disease with stage 1 through stage 4 chronic kidney disease, or unspecified chronic kidney disease: Secondary | ICD-10-CM | POA: Diagnosis not present

## 2021-03-09 DIAGNOSIS — E0590 Thyrotoxicosis, unspecified without thyrotoxic crisis or storm: Secondary | ICD-10-CM

## 2021-03-09 DIAGNOSIS — Z992 Dependence on renal dialysis: Secondary | ICD-10-CM | POA: Diagnosis not present

## 2021-03-10 DIAGNOSIS — D631 Anemia in chronic kidney disease: Secondary | ICD-10-CM | POA: Diagnosis not present

## 2021-03-10 DIAGNOSIS — N2581 Secondary hyperparathyroidism of renal origin: Secondary | ICD-10-CM | POA: Diagnosis not present

## 2021-03-10 DIAGNOSIS — N186 End stage renal disease: Secondary | ICD-10-CM | POA: Diagnosis not present

## 2021-03-10 DIAGNOSIS — Z992 Dependence on renal dialysis: Secondary | ICD-10-CM | POA: Diagnosis not present

## 2021-03-10 DIAGNOSIS — D509 Iron deficiency anemia, unspecified: Secondary | ICD-10-CM | POA: Diagnosis not present

## 2021-03-11 ENCOUNTER — Other Ambulatory Visit: Payer: Medicare Other

## 2021-03-11 ENCOUNTER — Other Ambulatory Visit: Payer: Self-pay

## 2021-03-11 DIAGNOSIS — E0590 Thyrotoxicosis, unspecified without thyrotoxic crisis or storm: Secondary | ICD-10-CM | POA: Diagnosis not present

## 2021-03-11 LAB — T4, FREE: Free T4: 1.5 ng/dL (ref 0.82–1.77)

## 2021-03-11 LAB — TSH: TSH: 6.7 u[IU]/mL — ABNORMAL HIGH (ref 0.450–4.500)

## 2021-03-12 DIAGNOSIS — D631 Anemia in chronic kidney disease: Secondary | ICD-10-CM | POA: Diagnosis not present

## 2021-03-12 DIAGNOSIS — Z992 Dependence on renal dialysis: Secondary | ICD-10-CM | POA: Diagnosis not present

## 2021-03-12 DIAGNOSIS — N2581 Secondary hyperparathyroidism of renal origin: Secondary | ICD-10-CM | POA: Diagnosis not present

## 2021-03-12 DIAGNOSIS — D509 Iron deficiency anemia, unspecified: Secondary | ICD-10-CM | POA: Diagnosis not present

## 2021-03-12 DIAGNOSIS — N186 End stage renal disease: Secondary | ICD-10-CM | POA: Diagnosis not present

## 2021-03-15 DIAGNOSIS — N186 End stage renal disease: Secondary | ICD-10-CM | POA: Diagnosis not present

## 2021-03-15 DIAGNOSIS — Z992 Dependence on renal dialysis: Secondary | ICD-10-CM | POA: Diagnosis not present

## 2021-03-15 DIAGNOSIS — D631 Anemia in chronic kidney disease: Secondary | ICD-10-CM | POA: Diagnosis not present

## 2021-03-15 DIAGNOSIS — D509 Iron deficiency anemia, unspecified: Secondary | ICD-10-CM | POA: Diagnosis not present

## 2021-03-15 DIAGNOSIS — N2581 Secondary hyperparathyroidism of renal origin: Secondary | ICD-10-CM | POA: Diagnosis not present

## 2021-03-17 DIAGNOSIS — Z20828 Contact with and (suspected) exposure to other viral communicable diseases: Secondary | ICD-10-CM | POA: Diagnosis not present

## 2021-03-17 DIAGNOSIS — Z992 Dependence on renal dialysis: Secondary | ICD-10-CM | POA: Diagnosis not present

## 2021-03-17 DIAGNOSIS — D631 Anemia in chronic kidney disease: Secondary | ICD-10-CM | POA: Diagnosis not present

## 2021-03-17 DIAGNOSIS — N186 End stage renal disease: Secondary | ICD-10-CM | POA: Diagnosis not present

## 2021-03-17 DIAGNOSIS — D509 Iron deficiency anemia, unspecified: Secondary | ICD-10-CM | POA: Diagnosis not present

## 2021-03-17 DIAGNOSIS — N2581 Secondary hyperparathyroidism of renal origin: Secondary | ICD-10-CM | POA: Diagnosis not present

## 2021-03-19 DIAGNOSIS — N2581 Secondary hyperparathyroidism of renal origin: Secondary | ICD-10-CM | POA: Diagnosis not present

## 2021-03-19 DIAGNOSIS — N186 End stage renal disease: Secondary | ICD-10-CM | POA: Diagnosis not present

## 2021-03-19 DIAGNOSIS — D631 Anemia in chronic kidney disease: Secondary | ICD-10-CM | POA: Diagnosis not present

## 2021-03-19 DIAGNOSIS — D509 Iron deficiency anemia, unspecified: Secondary | ICD-10-CM | POA: Diagnosis not present

## 2021-03-19 DIAGNOSIS — Z992 Dependence on renal dialysis: Secondary | ICD-10-CM | POA: Diagnosis not present

## 2021-03-22 DIAGNOSIS — D631 Anemia in chronic kidney disease: Secondary | ICD-10-CM | POA: Diagnosis not present

## 2021-03-22 DIAGNOSIS — D509 Iron deficiency anemia, unspecified: Secondary | ICD-10-CM | POA: Diagnosis not present

## 2021-03-22 DIAGNOSIS — N2581 Secondary hyperparathyroidism of renal origin: Secondary | ICD-10-CM | POA: Diagnosis not present

## 2021-03-22 DIAGNOSIS — Z992 Dependence on renal dialysis: Secondary | ICD-10-CM | POA: Diagnosis not present

## 2021-03-22 DIAGNOSIS — N186 End stage renal disease: Secondary | ICD-10-CM | POA: Diagnosis not present

## 2021-03-23 ENCOUNTER — Other Ambulatory Visit: Payer: Self-pay

## 2021-03-23 DIAGNOSIS — N184 Chronic kidney disease, stage 4 (severe): Secondary | ICD-10-CM

## 2021-03-24 DIAGNOSIS — N186 End stage renal disease: Secondary | ICD-10-CM | POA: Diagnosis not present

## 2021-03-24 DIAGNOSIS — D631 Anemia in chronic kidney disease: Secondary | ICD-10-CM | POA: Diagnosis not present

## 2021-03-24 DIAGNOSIS — D509 Iron deficiency anemia, unspecified: Secondary | ICD-10-CM | POA: Diagnosis not present

## 2021-03-24 DIAGNOSIS — N2581 Secondary hyperparathyroidism of renal origin: Secondary | ICD-10-CM | POA: Diagnosis not present

## 2021-03-24 DIAGNOSIS — Z992 Dependence on renal dialysis: Secondary | ICD-10-CM | POA: Diagnosis not present

## 2021-03-26 DIAGNOSIS — N186 End stage renal disease: Secondary | ICD-10-CM | POA: Diagnosis not present

## 2021-03-26 DIAGNOSIS — D631 Anemia in chronic kidney disease: Secondary | ICD-10-CM | POA: Diagnosis not present

## 2021-03-26 DIAGNOSIS — N2581 Secondary hyperparathyroidism of renal origin: Secondary | ICD-10-CM | POA: Diagnosis not present

## 2021-03-26 DIAGNOSIS — D509 Iron deficiency anemia, unspecified: Secondary | ICD-10-CM | POA: Diagnosis not present

## 2021-03-26 DIAGNOSIS — Z992 Dependence on renal dialysis: Secondary | ICD-10-CM | POA: Diagnosis not present

## 2021-03-28 DIAGNOSIS — N186 End stage renal disease: Secondary | ICD-10-CM | POA: Diagnosis not present

## 2021-03-28 DIAGNOSIS — D631 Anemia in chronic kidney disease: Secondary | ICD-10-CM | POA: Diagnosis not present

## 2021-03-28 DIAGNOSIS — N2581 Secondary hyperparathyroidism of renal origin: Secondary | ICD-10-CM | POA: Diagnosis not present

## 2021-03-28 DIAGNOSIS — Z992 Dependence on renal dialysis: Secondary | ICD-10-CM | POA: Diagnosis not present

## 2021-03-28 DIAGNOSIS — D509 Iron deficiency anemia, unspecified: Secondary | ICD-10-CM | POA: Diagnosis not present

## 2021-03-30 DIAGNOSIS — N186 End stage renal disease: Secondary | ICD-10-CM | POA: Diagnosis not present

## 2021-03-30 DIAGNOSIS — D631 Anemia in chronic kidney disease: Secondary | ICD-10-CM | POA: Diagnosis not present

## 2021-03-30 DIAGNOSIS — D509 Iron deficiency anemia, unspecified: Secondary | ICD-10-CM | POA: Diagnosis not present

## 2021-03-30 DIAGNOSIS — N2581 Secondary hyperparathyroidism of renal origin: Secondary | ICD-10-CM | POA: Diagnosis not present

## 2021-03-30 DIAGNOSIS — Z992 Dependence on renal dialysis: Secondary | ICD-10-CM | POA: Diagnosis not present

## 2021-04-02 DIAGNOSIS — N186 End stage renal disease: Secondary | ICD-10-CM | POA: Diagnosis not present

## 2021-04-02 DIAGNOSIS — Z992 Dependence on renal dialysis: Secondary | ICD-10-CM | POA: Diagnosis not present

## 2021-04-02 DIAGNOSIS — D509 Iron deficiency anemia, unspecified: Secondary | ICD-10-CM | POA: Diagnosis not present

## 2021-04-02 DIAGNOSIS — N2581 Secondary hyperparathyroidism of renal origin: Secondary | ICD-10-CM | POA: Diagnosis not present

## 2021-04-02 DIAGNOSIS — D631 Anemia in chronic kidney disease: Secondary | ICD-10-CM | POA: Diagnosis not present

## 2021-04-05 DIAGNOSIS — D509 Iron deficiency anemia, unspecified: Secondary | ICD-10-CM | POA: Diagnosis not present

## 2021-04-05 DIAGNOSIS — N2581 Secondary hyperparathyroidism of renal origin: Secondary | ICD-10-CM | POA: Diagnosis not present

## 2021-04-05 DIAGNOSIS — Z992 Dependence on renal dialysis: Secondary | ICD-10-CM | POA: Diagnosis not present

## 2021-04-05 DIAGNOSIS — N186 End stage renal disease: Secondary | ICD-10-CM | POA: Diagnosis not present

## 2021-04-05 DIAGNOSIS — D631 Anemia in chronic kidney disease: Secondary | ICD-10-CM | POA: Diagnosis not present

## 2021-04-07 DIAGNOSIS — D509 Iron deficiency anemia, unspecified: Secondary | ICD-10-CM | POA: Diagnosis not present

## 2021-04-07 DIAGNOSIS — N2581 Secondary hyperparathyroidism of renal origin: Secondary | ICD-10-CM | POA: Diagnosis not present

## 2021-04-07 DIAGNOSIS — N186 End stage renal disease: Secondary | ICD-10-CM | POA: Diagnosis not present

## 2021-04-07 DIAGNOSIS — Z992 Dependence on renal dialysis: Secondary | ICD-10-CM | POA: Diagnosis not present

## 2021-04-07 DIAGNOSIS — D631 Anemia in chronic kidney disease: Secondary | ICD-10-CM | POA: Diagnosis not present

## 2021-04-08 DIAGNOSIS — N186 End stage renal disease: Secondary | ICD-10-CM | POA: Diagnosis not present

## 2021-04-08 DIAGNOSIS — I129 Hypertensive chronic kidney disease with stage 1 through stage 4 chronic kidney disease, or unspecified chronic kidney disease: Secondary | ICD-10-CM | POA: Diagnosis not present

## 2021-04-08 DIAGNOSIS — Z992 Dependence on renal dialysis: Secondary | ICD-10-CM | POA: Diagnosis not present

## 2021-04-09 DIAGNOSIS — D631 Anemia in chronic kidney disease: Secondary | ICD-10-CM | POA: Diagnosis not present

## 2021-04-09 DIAGNOSIS — N186 End stage renal disease: Secondary | ICD-10-CM | POA: Diagnosis not present

## 2021-04-09 DIAGNOSIS — N2581 Secondary hyperparathyroidism of renal origin: Secondary | ICD-10-CM | POA: Diagnosis not present

## 2021-04-09 DIAGNOSIS — Z992 Dependence on renal dialysis: Secondary | ICD-10-CM | POA: Diagnosis not present

## 2021-04-09 DIAGNOSIS — Z23 Encounter for immunization: Secondary | ICD-10-CM | POA: Diagnosis not present

## 2021-04-12 DIAGNOSIS — D631 Anemia in chronic kidney disease: Secondary | ICD-10-CM | POA: Diagnosis not present

## 2021-04-12 DIAGNOSIS — N2581 Secondary hyperparathyroidism of renal origin: Secondary | ICD-10-CM | POA: Diagnosis not present

## 2021-04-12 DIAGNOSIS — N186 End stage renal disease: Secondary | ICD-10-CM | POA: Diagnosis not present

## 2021-04-12 DIAGNOSIS — Z23 Encounter for immunization: Secondary | ICD-10-CM | POA: Diagnosis not present

## 2021-04-12 DIAGNOSIS — Z992 Dependence on renal dialysis: Secondary | ICD-10-CM | POA: Diagnosis not present

## 2021-04-13 ENCOUNTER — Ambulatory Visit (INDEPENDENT_AMBULATORY_CARE_PROVIDER_SITE_OTHER): Payer: Medicare Other | Admitting: Vascular Surgery

## 2021-04-13 ENCOUNTER — Other Ambulatory Visit: Payer: Self-pay

## 2021-04-13 ENCOUNTER — Ambulatory Visit (HOSPITAL_COMMUNITY)
Admission: RE | Admit: 2021-04-13 | Discharge: 2021-04-13 | Disposition: A | Discharge status: Home / Self Care | Payer: Medicare Other | Source: Ambulatory Visit | Attending: Vascular Surgery | Admitting: Vascular Surgery

## 2021-04-13 ENCOUNTER — Ambulatory Visit (INDEPENDENT_AMBULATORY_CARE_PROVIDER_SITE_OTHER)
Admission: RE | Admit: 2021-04-13 | Discharge: 2021-04-13 | Disposition: A | Discharge status: Home / Self Care | Payer: Medicare Other | Source: Ambulatory Visit | Attending: Vascular Surgery | Admitting: Vascular Surgery

## 2021-04-13 ENCOUNTER — Encounter: Payer: Self-pay | Admitting: Vascular Surgery

## 2021-04-13 DIAGNOSIS — N186 End stage renal disease: Secondary | ICD-10-CM | POA: Diagnosis not present

## 2021-04-13 DIAGNOSIS — Z992 Dependence on renal dialysis: Secondary | ICD-10-CM

## 2021-04-13 DIAGNOSIS — N184 Chronic kidney disease, stage 4 (severe): Secondary | ICD-10-CM

## 2021-04-13 NOTE — Progress Notes (Signed)
Patient name: Darryl Diaz MRN: [id number] DOB: [birth year] Sex: male  REASON FOR CONSULT: Evaluate for permanent hemodialysis access placement  HPI: Darryl Diaz is a 80 y.o. male, with history of cirrhosis, CHF, hypertension, hyperlipidemia, A. fib, end-stage renal disease that presents for permanent hemodialysis access evaluation.  Patient is currently dialyzing Monday Wednesday Friday through a right IJ tunneled catheter.  He states he is right-handed.  No previous access in the past.  He moved from [place].  Past Medical History:  Diagnosis Date   Alcohol abuse 2/35/5732   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[place]) 03/17/2020   Alcoholism ([place])    in remission following wife's death   Anemia    Ascending aorta dilation (H[facility])    Cardiac arrest ([place]) 04/20/2019   12 min CPR with epi   Chronic diastolic CHF (congestive heart failure) ([place])    Fatty liver    Gastritis    GERD (gastroesophageal reflux disease)    H/O seasonal allergies    History of GI bleed    Hx of adenomatous colonic polyps 08/10/2017   Hyperlipidemia    Hypertension    Insomnia    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   Mallory-Weiss tear    OA (osteoarthritis)    hands   OSA (obstructive sleep apnea)    No longer uses CPAP   Persistent atrial fibrillation with rapid ventricular response ([place]) 02/05/2020   Recurrent syncope    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[facility])     Past Surgical History:  Procedure Laterality Date   ANKLE SURGERY Left 2014   had rods put in    BIOPSY  02/28/2019   Procedure: BIOPSY;  Surgeon: Milus Banister, MD;  Location: [place];  Service: Endoscopy;;   CARDIOVERSION  02/06/2020   CARDIOVERSION N/A 02/06/2020   Procedure: CARDIOVERSION;  Surgeon: Werner Lean, MD;  Location: [place] ENDOSCOPY;  Service: Cardiovascular;  Laterality: N/A;   CARDIOVERSION N/A 02/24/2020   Procedure: CARDIOVERSION;  Surgeon: Werner Lean, MD;   Location: [place];  Service: Cardiovascular;  Laterality: N/A;   COLONOSCOPY     [place]   COLONOSCOPY WITH PROPOFOL N/A 01/08/2021   Procedure: COLONOSCOPY WITH PROPOFOL;  Surgeon: Milus Banister, MD;  Location: [facility] ENDOSCOPY;  Service: Endoscopy;  Laterality: N/A;   ESOPHAGOGASTRODUODENOSCOPY (EGD) WITH PROPOFOL N/A 02/28/2019   Procedure: ESOPHAGOGASTRODUODENOSCOPY (EGD) WITH PROPOFOL;  Surgeon: Milus Banister, MD;  Location: [facility] ENDOSCOPY;  Service: Endoscopy;  Laterality: N/A;   HIP ARTHROPLASTY Left 04/01/2016   Procedure: ARTHROPLASTY BIPOLAR HIP (HEMIARTHROPLASTY);  Surgeon: Paralee Cancel, MD;  Location: [place];  Service: Orthopedics;  Laterality: Left;   HIP CLOSED REDUCTION Left 04/30/2016   Procedure: CLOSED REDUCTION HIP;  Surgeon: Wylene Simmer, MD;  Location: [place];  Service: Orthopedics;  Laterality: Left;   HOT HEMOSTASIS N/A 01/08/2021   Procedure: HOT HEMOSTASIS (ARGON PLASMA COAGULATION/BICAP);  Surgeon: Milus Banister, MD;  Location: [facility] ENDOSCOPY;  Service: Endoscopy;  Laterality: N/A;   IR RADIOLOGIST EVAL & MGMT  12/31/2020   TONSILLECTOMY     TOTAL HIP ARTHROPLASTY Right 10/15/2019   Procedure: TOTAL HIP ARTHROPLASTY ANTERIOR APPROACH;  Surgeon: Paralee Cancel, MD;  Location: [facility];  Service: Orthopedics;  Laterality: Right;  70 mins    Family History  Problem Relation Age of Onset   Heart disease Mother 34   Hypertension Mother    Arthritis Mother    Heart failure Father 61   Stroke Maternal  Aunt    Heart failure Sister    Colon cancer Neg Hx    Esophageal cancer Neg Hx    Stomach cancer Neg Hx    Rectal cancer Neg Hx     SOCIAL HISTORY: Social History   Socioeconomic History   Marital status: Widowed    Spouse name: Not on file   Number of children: 2   Years of education: college   Highest education level: Not on file  Occupational History   Occupation: retired  Tobacco Use   Smoking status: Former    Packs/day: 1.00    Years: 20.00    Pack  years: 20.00    Types: Cigarettes   Smokeless tobacco: Never   Tobacco comments:       quit 20 years  smoked on and off for 20 years  Vaping Use   Vaping Use: Never used  Substance and Sexual Activity   Alcohol use: Not Currently    Comment: h/o heavy use   Drug use: Never   Sexual activity: Not Currently  Other Topics Concern   Not on file  Social History Narrative   ** Merged History Encounter **       The patient is divorced w/ 1 son 1 daughter He is a retired Regulatory affairs officer, he lived in [place] but [employer] including windows on the world in the [place] room in [place] He moved to [place] to be near his sister who liv   es in [place] alcoholic member of AA 2 caffeinated beverages daily prior smoker no drugs   Social Determinants of Radio broadcast assistant Strain: Low Risk    Difficulty of Paying Living Expenses: Not hard at [employer] Insecurity: No Food Insecurity   Worried About Charity fundraiser in the Last Year: Never true   Arboriculturist in the Last Year: Never true  Transportation Needs: No Transportation Needs   Lack of Transportation (Medical): No   Lack of Transportation (Non-Medical): No  Physical Activity: Not on file  Stress: No Stress Concern Present   Feeling of Stress : Not at all  Social Connections: Moderately Integrated   Frequency of Communication with Friends and Family: More than three times a week   Frequency of Social Gatherings with Friends and Family: More than three times a week   Attends Religious Services: 1 to 4 times per year   Active Member of [employer] or Organizations: No   Attends Archivist Meetings: 1 to 4 times per year   Marital Status: Widowed  Human resources officer Violence: Not At Risk   Fear of Current or Ex-Partner: No   Emotionally Abused: No   Physically Abused: No   Sexually Abused: No    Allergies  Allergen Reactions   Penicillin G Sodium Other (See Comments)     Other reaction(s): rash   Penicillins Other (See Comments)    Tolerated Cephalosporin Date: 10/15/19. Allergy is from childhood Did it involve swelling of the face/tongue/throat, SOB, or low BP? Unknown Did it involve sudden or severe rash/hives, skin peeling, or any reaction on the inside of your mouth or nose? Unknown Did you need to seek medical attention at a hospital or doctor's office? Unknown When did it last happen?    childhood reaction   If all above answers are "NO", may proceed with cephalosporin use.   Penicillins     Current Outpatient Medications  Medication Sig Dispense Refill  acetaminophen (TYLENOL) 325 MG tablet Take 2 tablets (650 mg total) by mouth every 4 (four) hours as needed for headache or mild pain.     amiodarone (PACERONE) 200 MG tablet Take 1 tablet (200 mg total) by mouth daily. 90 tablet 3   atorvastatin (LIPITOR) 20 MG tablet Take 1 tablet (20 mg total) by mouth daily. 30 tablet 0   carvedilol (COREG) 6.25 MG tablet Take 1 tablet (6.25 mg total) by mouth 2 (two) times daily. 180 tablet 3   cefadroxil (DURICEF) 500 MG capsule Take 500 mg by mouth 2 (two) times daily.     escitalopram (LEXAPRO) 10 MG tablet Take 1 tablet (10 mg total) by mouth daily. 30 tablet 0   folic acid (FOLVITE) 1 MG tablet Take 1 tablet (1 mg total) by mouth daily. 30 tablet 0   ketoconazole (NIZORAL) 2 % cream Apply 1 application topically daily. To dry scaly skin on feet 60 g 2   loratadine (CLARITIN) 10 MG tablet Take 1 tablet (10 mg total) by mouth daily. 30 tablet 0   Magnesium Oxide 400 MG CAPS Take 1 capsule (400 mg total) by mouth daily.     Multiple Vitamins-Minerals (CENTRUM SILVER 50+MEN) TABS Take 1 tablet by mouth daily with breakfast.     nitroGLYCERIN (NITROSTAT) 0.4 MG SL tablet Place 1 tablet (0.4 mg total) under the tongue every 5 (five) minutes x 3 doses as needed for chest pain. 30 tablet 12   nystatin (MYCOSTATIN/NYSTOP) powder Apply topically 2 (two) times daily.  15 g 0   pantoprazole (PROTONIX) 40 MG tablet Take 1 tablet (40 mg total) by mouth daily. 30 tablet 0   potassium chloride SA (KLOR-CON) 20 MEQ tablet Take 1 tablet (20 mEq total) by mouth daily. 90 tablet 3   sodium bicarbonate 650 MG tablet Take 1 tablet (650 mg total) by mouth 2 (two) times daily.     torsemide 40 MG TABS Take 40 mg by mouth daily.     traZODone (DESYREL) 50 MG tablet Take 1 tablet (50 mg total) by mouth at bedtime. 30 tablet 0   doxycycline (VIBRAMYCIN) 100 MG capsule Take 100 mg by mouth 2 (two) times daily. (Patient not taking: Reported on 04/13/2021)     ferrous sulfate 325 (65 FE) MG tablet Take 1 tablet (325 mg total) by mouth daily.     No current facility-administered medications for this visit.   Facility-Administered Medications Ordered in Other Visits  Medication Dose Route Frequency Provider Last Rate Last Admin   metoprolol tartrate (LOPRESSOR) injection    Anesthesia Intra-op Mariea Clonts, CRNA   5 mg at 02/27/19 1318    REVIEW OF SYSTEMS:  [X]  denotes positive finding, [ ]  denotes negative finding Cardiac  Comments:  Chest pain or chest pressure:    Shortness of breath upon exertion:    Short of breath when lying flat:    Irregular heart rhythm:        Vascular    Pain in calf, thigh, or hip brought on by ambulation:    Pain in feet at night that wakes you up from your sleep:     Blood clot in your veins:    Leg swelling:         Pulmonary    Oxygen at home:    Productive cough:     Wheezing:         Neurologic    Sudden weakness in arms or legs:     Sudden  numbness in arms or legs:     Sudden onset of difficulty speaking or slurred speech:    Temporary loss of vision in one eye:     Problems with dizziness:         Gastrointestinal    Blood in stool:     Vomited blood:         Genitourinary    Burning when urinating:     Blood in urine:        Psychiatric    Major depression:         Hematologic    Bleeding problems:     Problems with blood clotting too easily:        Skin    Rashes or ulcers:        Constitutional    Fever or chills:      PHYSICAL EXAM: Vitals:   04/13/21 1047  BP: (!) 185/78  Pulse: 65  Resp: 16  Temp: 97.6 F (36.4 C)  TempSrc: Temporal  SpO2: 98%  Weight: 214 lb (97.1 kg)  Height: 6' (1.829 m)    GENERAL: The patient is a well-nourished male, in no acute distress. The vital signs are documented above. CARDIAC: There is a regular rate and rhythm.  VASCULAR:  Palpable radial and brachial pulses bilateral upper extremities Right IJ tunneled catheter PULMONARY: No respiratory distress. ABDOMEN: Soft and non-tender. MUSCULOSKELETAL: There are no major deformities or cyanosis. NEUROLOGIC: No focal weakness or paresthesias are detected. SKIN: There are no ulcers or rashes noted. PSYCHIATRIC: The patient has a normal affect.  DATA:   Upper extremity arterial duplex shows triphasic waveforms in both upper extremities.  Upper extremity vein map shows usable cephalic and basilic vein in the left arm.  Assessment/Plan:  29-year male with multiple comorbidities including end-stage renal disease that presents for permanent hemodialysis access evaluation.  I discussed plans for placement in the nondominant arm which would be his left arm.  He has easily palpable radial and brachial pulse for good inflow.  Vein mapping shows an excellent basilic vein with a likely usable cephalic vein as well.  Discussed evaluating these with ultrasound in the OR and plan for left arm AV fistula access.  Risk and benefits discussed in detail including risk of failure to mature and steal syndrome among other risks like bleeding and infection.  We will get him scheduled today.  All questions answered.   Marty Heck, MD [facility] Office: [phone]

## 2021-04-14 DIAGNOSIS — Z992 Dependence on renal dialysis: Secondary | ICD-10-CM | POA: Diagnosis not present

## 2021-04-14 DIAGNOSIS — Z23 Encounter for immunization: Secondary | ICD-10-CM | POA: Diagnosis not present

## 2021-04-14 DIAGNOSIS — N2581 Secondary hyperparathyroidism of renal origin: Secondary | ICD-10-CM | POA: Diagnosis not present

## 2021-04-14 DIAGNOSIS — N186 End stage renal disease: Secondary | ICD-10-CM | POA: Diagnosis not present

## 2021-04-14 DIAGNOSIS — D631 Anemia in chronic kidney disease: Secondary | ICD-10-CM | POA: Diagnosis not present

## 2021-04-16 DIAGNOSIS — D631 Anemia in chronic kidney disease: Secondary | ICD-10-CM | POA: Diagnosis not present

## 2021-04-16 DIAGNOSIS — N2581 Secondary hyperparathyroidism of renal origin: Secondary | ICD-10-CM | POA: Diagnosis not present

## 2021-04-16 DIAGNOSIS — N186 End stage renal disease: Secondary | ICD-10-CM | POA: Diagnosis not present

## 2021-04-16 DIAGNOSIS — Z23 Encounter for immunization: Secondary | ICD-10-CM | POA: Diagnosis not present

## 2021-04-16 DIAGNOSIS — Z992 Dependence on renal dialysis: Secondary | ICD-10-CM | POA: Diagnosis not present

## 2021-04-19 DIAGNOSIS — Z992 Dependence on renal dialysis: Secondary | ICD-10-CM | POA: Diagnosis not present

## 2021-04-19 DIAGNOSIS — Z23 Encounter for immunization: Secondary | ICD-10-CM | POA: Diagnosis not present

## 2021-04-19 DIAGNOSIS — D631 Anemia in chronic kidney disease: Secondary | ICD-10-CM | POA: Diagnosis not present

## 2021-04-19 DIAGNOSIS — N186 End stage renal disease: Secondary | ICD-10-CM | POA: Diagnosis not present

## 2021-04-19 DIAGNOSIS — N2581 Secondary hyperparathyroidism of renal origin: Secondary | ICD-10-CM | POA: Diagnosis not present

## 2021-04-20 ENCOUNTER — Encounter (HOSPITAL_COMMUNITY): Payer: Self-pay | Admitting: Physician Assistant

## 2021-04-20 DIAGNOSIS — Z23 Encounter for immunization: Secondary | ICD-10-CM | POA: Diagnosis not present

## 2021-04-20 DIAGNOSIS — Z992 Dependence on renal dialysis: Secondary | ICD-10-CM | POA: Diagnosis not present

## 2021-04-20 DIAGNOSIS — N186 End stage renal disease: Secondary | ICD-10-CM | POA: Diagnosis not present

## 2021-04-20 DIAGNOSIS — N2581 Secondary hyperparathyroidism of renal origin: Secondary | ICD-10-CM | POA: Diagnosis not present

## 2021-04-20 DIAGNOSIS — D631 Anemia in chronic kidney disease: Secondary | ICD-10-CM | POA: Diagnosis not present

## 2021-04-20 NOTE — Progress Notes (Signed)
Called patient and left detained instructions for day of surgery.   PCP - Domenick Gong Cardiologist - [place] cooper EP Physician: Virl Axe    Chest x-ray - n/a EKG - 02/03/21 Stress Test - 02/23/20 ECHO - 01/17/21 Cardiac Cath - none in EPIC  CPAP - unknown  No diabetes    ERAS Protcol - no  COVID TEST- ambulatory surgery  Anesthesia review: yes, sent  Patient verbally denies any shortness of breath, fever, cough and chest pain during phone call   -------------  SDW INSTRUCTIONS given:  Your procedure is scheduled on 04/21/21.  Report to [facility] Main Entrance "A" at 8:30 A.M., and check in at the Admitting office.  Call this number if you have problems the morning of surgery:  [phone]   Remember:  Do not eat or drink after midnight the night before your surgery      Take these medicines the morning of surgery with A SIP OF WATER  acetaminophen (TYLENOL) if needed amiodarone (PACERONE) atorvastatin (LIPITOR) carvedilol (COREG) cefadroxil (DURICEF) cetirizine (ZYRTEC)  escitalopram (LEXAPRO)  loratadine (CLARITIN)  nitroGLYCERIN (NITROSTAT) if needed pantoprazole (PROTONIX)  As of today, STOP taking any Aspirin (unless otherwise instructed by your surgeon) Aleve, Naproxen, Ibuprofen, Motrin, Advil, Goody's, BC's, all herbal medications, fish oil, and all vitamins.                      Do not wear jewelry, make up, or nail polish            Do not wear lotions, powders, perfumes/colognes, or deodorant.            Do not shave 48 hours prior to surgery.  Men may shave face and neck.            Do not bring valuables to the hospital.            [facility] is not responsible for any belongings or valuables.  Do NOT Smoke (Tobacco/Vaping) or drink Alcohol 24 hours prior to your procedure If you use a CPAP at night, you may bring all equipment for your overnight stay.   Contacts, glasses, dentures or bridgework may not be worn into surgery.       For patients admitted to the hospital, discharge time will be determined by your treatment team.   Patients discharged the day of surgery will not be allowed to drive home, and someone needs to stay with them for 24 hours.    Special instructions:   [place]- Preparing For Surgery  Before surgery, you can play an important role. Because skin is not sterile, your skin needs to be as free of germs as possible. You can reduce the number of germs on your skin by washing with CHG (chlorahexidine gluconate) Soap before surgery.  CHG is an antiseptic cleaner which kills germs and bonds with the skin to continue killing germs even after washing.    Oral Hygiene is also important to reduce your risk of infection.  Remember - BRUSH YOUR TEETH THE MORNING OF SURGERY WITH YOUR REGULAR TOOTHPASTE  Please do not use if you have an allergy to CHG or antibacterial soaps. If your skin becomes reddened/irritated stop using the CHG.  Do not shave (including legs and underarms) for at least 48 hours prior to first CHG shower. It is OK to shave your face.  Please follow these instructions carefully.   Shower the NIGHT BEFORE SURGERY and the MORNING OF SURGERY with DIAL  Soap.   Pat yourself dry with a CLEAN TOWEL.  Wear CLEAN PAJAMAS to bed the night before surgery  Place CLEAN SHEETS on your bed the night of your first shower and DO NOT SLEEP WITH PETS.   Day of Surgery: Please shower morning of surgery  Wear Clean/Comfortable clothing the morning of surgery Do not apply any deodorants/lotions.   Remember to brush your teeth WITH YOUR REGULAR TOOTHPASTE.   Questions were answered. Patient verbalized understanding of instructions.

## 2021-04-20 NOTE — Progress Notes (Signed)
Anesthesia Chart Review: Same day workup  Follows with cardiology for history of paroxysmal atrial fibrillation, tachycardia/bradycardia syndrome (had bradycardic arrest 04/20/2019, received CPR and epi and achieved ROSC - although per notes, questioned whether true arrest vs. altered LOC given bradycardia with poor perfusion possibly secondary to medication overdose), HFpEF, HTN.  Last seen 01/26/2021.  Per note, he was maintaining NSR and has not had any syncope or near syncope to suggest significant bradycardia.  Recommended continue amiodarone 200 mg daily.  He is not anticoagulated due to history of recurrent GI bleeding.  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  Pt will need DOS labs and evaluation.   EKG 02/03/21: NSR. Rate 67.  TTE 01/17/21:  1. Left ventricular ejection fraction, by estimation, is 60 to 65%. The  left ventricle has normal function. The left ventricle has no regional  wall motion abnormalities. There is mild concentric left ventricular  hypertrophy. Left ventricular diastolic  function could not be evaluated.   2. Right ventricular systolic function is normal. The right ventricular  size is normal. There is normal pulmonary artery systolic pressure.   3. Left atrial size was severely dilated.   4. The mitral valve is normal in structure. Trivial mitral valve  regurgitation. No evidence of mitral stenosis.   5. The aortic valve is tricuspid. There is mild calcification of the  aortic valve. There is mild thickening of the aortic valve. Aortic valve  regurgitation is not visualized. Mild aortic valve stenosis. Aortic valve  area, by VTI measures 3.20 cm.  Aortic valve mean gradient measures 12.0 mmHg. Aortic valve Vmax measures  2.35 m/s.   6. Aortic dilatation noted. There is mild dilatation of the aortic root,  measuring 40 mm.   7. The inferior vena cava is dilated in size with >50% respiratory  variability,  suggesting right atrial pressure of 8 mmHg.   Event monitor 04/27/20: The basic rhythm is normal sinus with an average HR of 74 bpm No atrial fibrillation or flutter No high-grade heart block or pathologic pauses There are occasional PVC's and rare supraventricular beats without sustained arrhythmias  Nuclear stress 02/23/20: There was no ST segment deviation noted during stress. The study is normal. This is a low risk study. The left ventricular ejection fraction is normal (55-65%).   Wynonia Musty [facility] Short Stay [facility]/Anesthesiology Phone [phone] 04/20/2021 4:42 PM

## 2021-04-20 NOTE — Anesthesia Preprocedure Evaluation (Deleted)
Anesthesia Evaluation    Airway        Dental   Pulmonary former smoker,           Cardiovascular hypertension,      Neuro/Psych    GI/Hepatic   Endo/Other    Renal/GU      Musculoskeletal   Abdominal   Peds  Hematology   Anesthesia Other Findings   Reproductive/Obstetrics                             Anesthesia Physical Anesthesia Plan  ASA:   Anesthesia Plan:    Post-op Pain Management:    Induction:   PONV Risk Score and Plan:   Airway Management Planned:   Additional Equipment:   Intra-op Plan:   Post-operative Plan:   Informed Consent:   Plan Discussed with:   Anesthesia Plan Comments: (PAT note by Karoline Caldwell, PA-C: Follows with cardiology for history of paroxysmal atrial fibrillation, tachycardia/bradycardia syndrome (had bradycardic arrest 04/20/2019, received CPR and epi and achieved ROSC - although per notes, questioned whether true arrest vs. altered LOC given bradycardia with poor perfusion possibly secondary to medication overdose), HFpEF, HTN.  Last seen 01/26/2021.  Per note, he was maintaining NSR and has not had any syncope or near syncope to suggest significant bradycardia.  Recommended continue amiodarone 200 mg daily.  He is not anticoagulated due to history of recurrent GI bleeding.  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  Pt will need DOS labs and evaluation.   EKG 02/03/21: NSR. Rate 67.  TTE 01/17/21: 1. Left ventricular ejection fraction, by estimation, is 60 to 65%. The  left ventricle has normal function. The left ventricle has no regional  wall motion abnormalities. There is mild concentric left ventricular  hypertrophy. Left ventricular diastolic  function could not be evaluated.  2. Right ventricular systolic function is normal. The right ventricular  size is normal. There is normal pulmonary  artery systolic pressure.  3. Left atrial size was severely dilated.  4. The mitral valve is normal in structure. Trivial mitral valve  regurgitation. No evidence of mitral stenosis.  5. The aortic valve is tricuspid. There is mild calcification of the  aortic valve. There is mild thickening of the aortic valve. Aortic valve  regurgitation is not visualized. Mild aortic valve stenosis. Aortic valve  area, by VTI measures 3.20 cm.  Aortic valve mean gradient measures 12.0 mmHg. Aortic valve Vmax measures  2.35 m/s.  6. Aortic dilatation noted. There is mild dilatation of the aortic root,  measuring 40 mm.  7. The inferior vena cava is dilated in size with >50% respiratory  variability, suggesting right atrial pressure of 8 mmHg.   Event monitor 04/27/20: 1. The basic rhythm is normal sinus with an average HR of 74 bpm 2. No atrial fibrillation or flutter 3. No high-grade heart block or pathologic pauses 4. There are occasional PVC's and rare supraventricular beats without sustained arrhythmias  Nuclear stress 02/23/20:  There was no ST segment deviation noted during stress.  The study is normal.  This is a low risk study.  The left ventricular ejection fraction is normal (55-65%).  )        Anesthesia Quick Evaluation

## 2021-04-21 ENCOUNTER — Other Ambulatory Visit: Payer: Self-pay

## 2021-04-21 ENCOUNTER — Ambulatory Visit (HOSPITAL_COMMUNITY): Payer: Medicare Other | Admitting: Anesthesiology

## 2021-04-21 ENCOUNTER — Encounter (HOSPITAL_COMMUNITY): Payer: Self-pay | Admitting: Vascular Surgery

## 2021-04-21 ENCOUNTER — Encounter (HOSPITAL_COMMUNITY): Admission: RE | Payer: Self-pay | Source: Home / Self Care

## 2021-04-21 ENCOUNTER — Encounter (HOSPITAL_COMMUNITY)
Admission: RE | Disposition: A | Discharge status: Home / Self Care | Payer: Self-pay | Source: Ambulatory Visit | Attending: Vascular Surgery

## 2021-04-21 ENCOUNTER — Ambulatory Visit (HOSPITAL_COMMUNITY): Admission: RE | Admit: 2021-04-21 | Payer: Medicare Other | Source: Home / Self Care | Admitting: Vascular Surgery

## 2021-04-21 ENCOUNTER — Ambulatory Visit (HOSPITAL_COMMUNITY)
Admission: RE | Admit: 2021-04-21 | Discharge: 2021-04-21 | Disposition: A | Discharge status: Home / Self Care | Payer: Medicare Other | Source: Ambulatory Visit | Attending: Vascular Surgery | Admitting: Vascular Surgery

## 2021-04-21 DIAGNOSIS — I5032 Chronic diastolic (congestive) heart failure: Secondary | ICD-10-CM | POA: Insufficient documentation

## 2021-04-21 DIAGNOSIS — N186 End stage renal disease: Secondary | ICD-10-CM | POA: Insufficient documentation

## 2021-04-21 DIAGNOSIS — E785 Hyperlipidemia, unspecified: Secondary | ICD-10-CM | POA: Diagnosis not present

## 2021-04-21 DIAGNOSIS — I132 Hypertensive heart and chronic kidney disease with heart failure and with stage 5 chronic kidney disease, or end stage renal disease: Secondary | ICD-10-CM | POA: Diagnosis not present

## 2021-04-21 DIAGNOSIS — K7030 Alcoholic cirrhosis of liver without ascites: Secondary | ICD-10-CM | POA: Diagnosis not present

## 2021-04-21 DIAGNOSIS — Z992 Dependence on renal dialysis: Secondary | ICD-10-CM | POA: Diagnosis not present

## 2021-04-21 DIAGNOSIS — I2510 Atherosclerotic heart disease of native coronary artery without angina pectoris: Secondary | ICD-10-CM | POA: Diagnosis not present

## 2021-04-21 DIAGNOSIS — N179 Acute kidney failure, unspecified: Secondary | ICD-10-CM | POA: Diagnosis not present

## 2021-04-21 HISTORY — PX: AV FISTULA PLACEMENT: SHX1204

## 2021-04-21 LAB — POCT I-STAT, CHEM 8
BUN: 16 mg/dL (ref 8–23)
Calcium, Ion: 1.09 mmol/L — ABNORMAL LOW (ref 1.15–1.40)
Chloride: 97 mmol/L — ABNORMAL LOW (ref 98–111)
Creatinine, Ser: 2.3 mg/dL — ABNORMAL HIGH (ref 0.61–1.24)
Glucose, Bld: 98 mg/dL (ref 70–99)
HCT: 33 % — ABNORMAL LOW (ref 39.0–52.0)
Hemoglobin: 11.2 g/dL — ABNORMAL LOW (ref 13.0–17.0)
Potassium: 4.2 mmol/L (ref 3.5–5.1)
Sodium: 135 mmol/L (ref 135–145)
TCO2: 31 mmol/L (ref 22–32)

## 2021-04-21 SURGERY — ARTERIOVENOUS (AV) FISTULA CREATION
Anesthesia: Monitor Anesthesia Care | Site: Arm Lower | Laterality: Left

## 2021-04-21 SURGERY — ARTERIOVENOUS (AV) FISTULA CREATION
Anesthesia: Choice | Laterality: Left

## 2021-04-21 MED ORDER — CHLORHEXIDINE GLUCONATE 4 % EX LIQD: 60.0000 mL | Freq: Once | CUTANEOUS | Status: DC

## 2021-04-21 MED ORDER — ONDANSETRON HCL 4 MG/2ML IJ SOLN
INTRAMUSCULAR | Status: DC | PRN
  Administered 2021-04-21: 4 mg via INTRAVENOUS

## 2021-04-21 MED ORDER — CHLORHEXIDINE GLUCONATE 0.12 % MT SOLN
15.0000 mL | Freq: Once | OROMUCOSAL | Status: AC
  Administered 2021-04-21: 10:00:00 15 mL via OROMUCOSAL

## 2021-04-21 MED ORDER — 0.9 % SODIUM CHLORIDE (POUR BTL) OPTIME
TOPICAL | Status: DC | PRN
  Administered 2021-04-21: 10:00:00 500 mL

## 2021-04-21 MED ORDER — SODIUM CHLORIDE 0.9 % IV SOLN: INTRAVENOUS | Status: DC

## 2021-04-21 MED ORDER — HEPARIN 6000 UNIT IRRIGATION SOLUTION
Status: DC | PRN
  Administered 2021-04-21: 1

## 2021-04-21 MED ORDER — FENTANYL CITRATE (PF) 250 MCG/5ML IJ SOLN
INTRAMUSCULAR | Status: AC
  Filled 2021-04-21: qty 5

## 2021-04-21 MED ORDER — PROPOFOL 10 MG/ML IV BOLUS
INTRAVENOUS | Status: DC | PRN
  Administered 2021-04-21: 30 mg via INTRAVENOUS

## 2021-04-21 MED ORDER — HEPARIN SODIUM (PORCINE) 1000 UNIT/ML IJ SOLN
INTRAMUSCULAR | Status: DC | PRN
  Administered 2021-04-21: 3000 [IU] via INTRAVENOUS

## 2021-04-21 MED ORDER — PROPOFOL 10 MG/ML IV BOLUS
INTRAVENOUS | Status: AC
  Filled 2021-04-21: qty 20

## 2021-04-21 MED ORDER — HYDROCODONE-ACETAMINOPHEN 5-325 MG PO TABS: 1.0000 | ORAL_TABLET | Freq: Four times a day (QID) | ORAL | 0 refills | Status: DC | PRN

## 2021-04-21 MED ORDER — FENTANYL CITRATE (PF) 100 MCG/2ML IJ SOLN: 25.0000 ug | INTRAMUSCULAR | Status: DC | PRN

## 2021-04-21 MED ORDER — VANCOMYCIN HCL IN DEXTROSE 1-5 GM/200ML-% IV SOLN
1000.0000 mg | INTRAVENOUS | Status: AC
  Administered 2021-04-21: 10:00:00 1000 mg via INTRAVENOUS
  Filled 2021-04-21: qty 200

## 2021-04-21 MED ORDER — CHLORHEXIDINE GLUCONATE 0.12 % MT SOLN
OROMUCOSAL | Status: AC
  Filled 2021-04-21: qty 15

## 2021-04-21 MED ORDER — LIDOCAINE-EPINEPHRINE (PF) 1.5 %-1:200000 IJ SOLN
INTRAMUSCULAR | Status: DC | PRN
  Administered 2021-04-21: 25 mL via PERINEURAL

## 2021-04-21 MED ORDER — FENTANYL CITRATE (PF) 100 MCG/2ML IJ SOLN: 50.0000 ug | Freq: Once | INTRAMUSCULAR | Status: AC

## 2021-04-21 MED ORDER — PROPOFOL 500 MG/50ML IV EMUL
INTRAVENOUS | Status: DC | PRN
  Administered 2021-04-21: 100 ug/kg/min via INTRAVENOUS

## 2021-04-21 MED ORDER — MIDAZOLAM HCL 2 MG/2ML IJ SOLN
INTRAMUSCULAR | Status: AC
  Administered 2021-04-21: 10:00:00 1 mg via INTRAVENOUS
  Filled 2021-04-21: qty 2

## 2021-04-21 MED ORDER — FENTANYL CITRATE (PF) 250 MCG/5ML IJ SOLN
INTRAMUSCULAR | Status: DC | PRN
  Administered 2021-04-21: 25 ug via INTRAVENOUS

## 2021-04-21 MED ORDER — FENTANYL CITRATE (PF) 100 MCG/2ML IJ SOLN
INTRAMUSCULAR | Status: AC
  Administered 2021-04-21: 10:00:00 50 ug via INTRAVENOUS
  Filled 2021-04-21: qty 2

## 2021-04-21 MED ORDER — MIDAZOLAM HCL 2 MG/2ML IJ SOLN: 1.0000 mg | Freq: Once | INTRAMUSCULAR | Status: AC

## 2021-04-21 SURGICAL SUPPLY — 39 items
ARMBAND PINK RESTRICT EXTREMIT (MISCELLANEOUS) ×4 IMPLANT
BAG COUNTER SPONGE SURGICOUNT (BAG) ×2 IMPLANT
CANISTER SUCT 3000ML PPV (MISCELLANEOUS) ×2 IMPLANT
CLIP TI WIDE RED SMALL 6 (CLIP) ×1 IMPLANT
CLIP VESOCCLUDE MED 6/CT (CLIP) ×2 IMPLANT
CLIP VESOCCLUDE SM WIDE 6/CT (CLIP) ×2 IMPLANT
COVER PROBE W GEL 5X96 (DRAPES) ×2 IMPLANT
DERMABOND ADVANCED (GAUZE/BANDAGES/DRESSINGS) ×1
DERMABOND ADVANCED .7 DNX12 (GAUZE/BANDAGES/DRESSINGS) ×1 IMPLANT
ELECT REM PT RETURN 9FT ADLT (ELECTROSURGICAL) ×2
ELECTRODE REM PT RTRN 9FT ADLT (ELECTROSURGICAL) ×1 IMPLANT
GAUZE 4X4 16PLY ~~LOC~~+RFID DBL (SPONGE) ×1 IMPLANT
GLOVE SRG 8 PF TXTR STRL LF DI (GLOVE) ×1 IMPLANT
GLOVE SURG ENC MOIS LTX SZ7.5 (GLOVE) ×2 IMPLANT
GLOVE SURG MICRO LTX SZ7 (GLOVE) ×1 IMPLANT
GLOVE SURG ORTHO LTX SZ6.5 (GLOVE) ×1 IMPLANT
GLOVE SURG POLYISO LF SZ6.5 (GLOVE) ×1 IMPLANT
GLOVE SURG UNDER POLY LF SZ7 (GLOVE) ×1 IMPLANT
GLOVE SURG UNDER POLY LF SZ8 (GLOVE) ×2
GOWN STRL REUS W/ TWL LRG LVL3 (GOWN DISPOSABLE) ×2 IMPLANT
GOWN STRL REUS W/ TWL XL LVL3 (GOWN DISPOSABLE) ×2 IMPLANT
GOWN STRL REUS W/TWL LRG LVL3 (GOWN DISPOSABLE) ×2
GOWN STRL REUS W/TWL XL LVL3 (GOWN DISPOSABLE) ×2
HEMOSTAT SPONGE AVITENE ULTRA (HEMOSTASIS) IMPLANT
KIT BASIN OR (CUSTOM PROCEDURE TRAY) ×2 IMPLANT
KIT TURNOVER KIT B (KITS) ×2 IMPLANT
LOOP VESSEL MINI RED (MISCELLANEOUS) ×1 IMPLANT
NS IRRIG 1000ML POUR BTL (IV SOLUTION) ×2 IMPLANT
PACK CV ACCESS (CUSTOM PROCEDURE TRAY) ×2 IMPLANT
PAD ARMBOARD 7.5X6 YLW CONV (MISCELLANEOUS) ×4 IMPLANT
SPONGE T-LAP 18X18 ~~LOC~~+RFID (SPONGE) ×1 IMPLANT
SUT MNCRL AB 4-0 PS2 18 (SUTURE) ×2 IMPLANT
SUT PROLENE 6 0 BV (SUTURE) ×3 IMPLANT
SUT PROLENE 7 0 BV 1 (SUTURE) IMPLANT
SUT VIC AB 3-0 SH 27 (SUTURE) ×1
SUT VIC AB 3-0 SH 27X BRD (SUTURE) ×1 IMPLANT
TOWEL GREEN STERILE (TOWEL DISPOSABLE) ×2 IMPLANT
UNDERPAD 30X36 HEAVY ABSORB (UNDERPADS AND DIAPERS) ×2 IMPLANT
WATER STERILE IRR 1000ML POUR (IV SOLUTION) ×2 IMPLANT

## 2021-04-21 NOTE — H&P (Signed)
History and Physical Interval Note:  04/21/2021 9:30 AM  [name]  has presented today for surgery, with the diagnosis of ESRD.  The various methods of treatment have been discussed with the patient and family. After consideration of risks, benefits and other options for treatment, the patient has consented to  Procedure(s): LEFT ARM ARTERIOVENOUS (AV) FISTULA CREATION (Left) as a surgical intervention.  The patient's history has been reviewed, patient examined, no change in status, stable for surgery.  I have reviewed the patient's chart and labs.  Questions were answered to the patient's satisfaction.     Marty Heck  Patient name: Darryl Diaz      MRN: [id number]        DOB: Mar 23, 1941          Sex: male   REASON FOR CONSULT: Evaluate for permanent hemodialysis access placement   HPI: Darryl Diaz is a 80 y.o. male, with history of cirrhosis, CHF, hypertension, hyperlipidemia, A. fib, end-stage renal disease that presents for permanent hemodialysis access evaluation.  Patient is currently dialyzing Monday Wednesday Friday through a right IJ tunneled catheter.  He states he is right-handed.  No previous access in the past.  He moved from [place].       Past Medical History:  Diagnosis Date   Alcohol abuse 1/91/4782   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[place]) 03/17/2020   Alcoholism ([place])      in remission following wife's death   Anemia     Ascending aorta dilation ([facility])     Cardiac arrest ([place]) 04/20/2019    12 min CPR with epi   Chronic diastolic CHF (congestive heart failure) ([place])     Fatty liver     Gastritis     GERD (gastroesophageal reflux disease)     H/O seasonal allergies     History of GI bleed     Hx of adenomatous colonic polyps 08/10/2017   Hyperlipidemia     Hypertension     Insomnia     Major depressive disorder      following wife's death   Mallory-Weiss tear     OA (osteoarthritis)      hands   OSA (obstructive sleep  apnea)      No longer uses CPAP   Persistent atrial fibrillation with rapid ventricular response ([place]) 02/05/2020   Recurrent syncope     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[facility])             Past Surgical History:  Procedure Laterality Date   ANKLE SURGERY Left 2014    had rods put in    BIOPSY   02/28/2019    Procedure: BIOPSY;  Surgeon: Milus Banister, MD;  Location: [facility];  Service: Endoscopy;;   CARDIOVERSION   02/06/2020   CARDIOVERSION N/A 02/06/2020    Procedure: CARDIOVERSION;  Surgeon: Werner Lean, MD;  Location: [facility]COPY;  Service: Cardiovascular;  Laterality: N/A;   CARDIOVERSION N/A 02/24/2020    Procedure: CARDIOVERSION;  Surgeon: Werner Lean, MD;  Location: [place];  Service: Cardiovascular;  Laterality: N/A;   COLONOSCOPY        [place]   COLONOSCOPY WITH PROPOFOL N/A 01/08/2021    Procedure: COLONOSCOPY WITH PROPOFOL;  Surgeon: Milus Banister, MD;  Location: [facility] ENDOSCOPY;  Service: Endoscopy;  Laterality: N/A;   ESOPHAGOGASTRODUODENOSCOPY (EGD) WITH PROPOFOL N/A 02/28/2019    Procedure: ESOPHAGOGASTRODUODENOSCOPY (EGD) WITH PROPOFOL;  Surgeon: Milus Banister, MD;  Location: [place];  Service:  Endoscopy;  Laterality: N/A;   HIP ARTHROPLASTY Left 04/01/2016    Procedure: ARTHROPLASTY BIPOLAR HIP (HEMIARTHROPLASTY);  Surgeon: Paralee Cancel, MD;  Location: [place];  Service: Orthopedics;  Laterality: Left;   HIP CLOSED REDUCTION Left 04/30/2016    Procedure: CLOSED REDUCTION HIP;  Surgeon: Wylene Simmer, MD;  Location: [place];  Service: Orthopedics;  Laterality: Left;   HOT HEMOSTASIS N/A 01/08/2021    Procedure: HOT HEMOSTASIS (ARGON PLASMA COAGULATION/BICAP);  Surgeon: Milus Banister, MD;  Location: [facility] ENDOSCOPY;  Service: Endoscopy;  Laterality: N/A;   IR RADIOLOGIST EVAL & MGMT   12/31/2020   TONSILLECTOMY       TOTAL HIP ARTHROPLASTY Right 10/15/2019    Procedure: TOTAL HIP ARTHROPLASTY ANTERIOR APPROACH;  Surgeon: Paralee Cancel, MD;   Location: [facility];  Service: Orthopedics;  Laterality: Right;  70 mins           Family History  Problem Relation Age of Onset   Heart disease Mother 50   Hypertension Mother     Arthritis Mother     Heart failure Father 14   Stroke Maternal Aunt     Heart failure Sister     Colon cancer Neg Hx     Esophageal cancer Neg Hx     Stomach cancer Neg Hx     Rectal cancer Neg Hx        SOCIAL HISTORY: Social History         Socioeconomic History   Marital status: Widowed      Spouse name: Not on file   Number of children: 2   Years of education: college   Highest education level: Not on file  Occupational History   Occupation: retired  Tobacco Use   Smoking status: Former      Packs/day: 1.00      Years: 20.00      Pack years: 20.00      Types: Cigarettes   Smokeless tobacco: Never   Tobacco comments:         quit 20 years  smoked on and off for 20 years  Vaping Use   Vaping Use: Never used  Substance and Sexual Activity   Alcohol use: Not Currently      Comment: h/o heavy use   Drug use: Never   Sexual activity: Not Currently  Other Topics Concern   Not on file  Social History Narrative    ** Merged History Encounter **         The patient is divorced w/ 1 son 1 daughter He is a retired Regulatory affairs officer, he lived in [place] but [employer] including windows on the world in the [place] room in [place] He moved to [place] to be near his sister who liv    es in [place] alcoholic member of AA 2 caffeinated beverages daily prior smoker no drugs    Social Determinants of Scientist, research (life sciences) Strain: Low Risk    Difficulty of Paying Living Expenses: Not hard at [employer] Insecurity: No Food Insecurity   Worried About Charity fundraiser in the Last Year: Never true   Arboriculturist in the Last Year: Never true  Transportation Needs: No Transportation Needs   Lack of Transportation (Medical): No   Lack of  Transportation (Non-Medical): No  Physical Activity: Not on file  Stress: No Stress Concern Present   Feeling of Stress : Not at all  Social  Connections: Moderately Integrated   Frequency of Communication with Friends and Family: More than three times a week   Frequency of Social Gatherings with Friends and Family: More than three times a week   Attends Religious Services: 1 to 4 times per year   Active Member of [employer] or Organizations: No   Attends Archivist Meetings: 1 to 4 times per year   Marital Status: Widowed  Human resources officer Violence: Not At Risk   Fear of Current or Ex-Partner: No   Emotionally Abused: No   Physically Abused: No   Sexually Abused: No           Allergies  Allergen Reactions   Penicillin G Sodium Other (See Comments)      Other reaction(s): rash   Penicillins Other (See Comments)      Tolerated Cephalosporin Date: 10/15/19. Allergy is from childhood Did it involve swelling of the face/tongue/throat, SOB, or low BP? Unknown Did it involve sudden or severe rash/hives, skin peeling, or any reaction on the inside of your mouth or nose? Unknown Did you need to seek medical attention at a hospital or doctor's office? Unknown When did it last happen?    childhood reaction   If all above answers are "NO", may proceed with cephalosporin use.   Penicillins              Current Outpatient Medications  Medication Sig Dispense Refill   acetaminophen (TYLENOL) 325 MG tablet Take 2 tablets (650 mg total) by mouth every 4 (four) hours as needed for headache or mild pain.       amiodarone (PACERONE) 200 MG tablet Take 1 tablet (200 mg total) by mouth daily. 90 tablet 3   atorvastatin (LIPITOR) 20 MG tablet Take 1 tablet (20 mg total) by mouth daily. 30 tablet 0   carvedilol (COREG) 6.25 MG tablet Take 1 tablet (6.25 mg total) by mouth 2 (two) times daily. 180 tablet 3   cefadroxil (DURICEF) 500 MG capsule Take 500 mg by mouth 2 (two) times daily.        escitalopram (LEXAPRO) 10 MG tablet Take 1 tablet (10 mg total) by mouth daily. 30 tablet 0   folic acid (FOLVITE) 1 MG tablet Take 1 tablet (1 mg total) by mouth daily. 30 tablet 0   ketoconazole (NIZORAL) 2 % cream Apply 1 application topically daily. To dry scaly skin on feet 60 g 2   loratadine (CLARITIN) 10 MG tablet Take 1 tablet (10 mg total) by mouth daily. 30 tablet 0   Magnesium Oxide 400 MG CAPS Take 1 capsule (400 mg total) by mouth daily.       Multiple Vitamins-Minerals (CENTRUM SILVER 50+MEN) TABS Take 1 tablet by mouth daily with breakfast.       nitroGLYCERIN (NITROSTAT) 0.4 MG SL tablet Place 1 tablet (0.4 mg total) under the tongue every 5 (five) minutes x 3 doses as needed for chest pain. 30 tablet 12   nystatin (MYCOSTATIN/NYSTOP) powder Apply topically 2 (two) times daily. 15 g 0   pantoprazole (PROTONIX) 40 MG tablet Take 1 tablet (40 mg total) by mouth daily. 30 tablet 0   potassium chloride SA (KLOR-CON) 20 MEQ tablet Take 1 tablet (20 mEq total) by mouth daily. 90 tablet 3   sodium bicarbonate 650 MG tablet Take 1 tablet (650 mg total) by mouth 2 (two) times daily.       torsemide 40 MG TABS Take 40 mg by mouth daily.  traZODone (DESYREL) 50 MG tablet Take 1 tablet (50 mg total) by mouth at bedtime. 30 tablet 0   doxycycline (VIBRAMYCIN) 100 MG capsule Take 100 mg by mouth 2 (two) times daily. (Patient not taking: Reported on 04/13/2021)       ferrous sulfate 325 (65 FE) MG tablet Take 1 tablet (325 mg total) by mouth daily.        No current facility-administered medications for this visit.             Facility-Administered Medications Ordered in Other Visits  Medication Dose Route Frequency Provider Last Rate Last Admin   metoprolol tartrate (LOPRESSOR) injection     Anesthesia Intra-op Mariea Clonts, CRNA   5 mg at 02/27/19 1318      REVIEW OF SYSTEMS:  [X]  denotes positive finding, [ ]  denotes negative finding Cardiac   Comments:  Chest pain or chest  pressure:      Shortness of breath upon exertion:      Short of breath when lying flat:      Irregular heart rhythm:             Vascular      Pain in calf, thigh, or hip brought on by ambulation:      Pain in feet at night that wakes you up from your sleep:       Blood clot in your veins:      Leg swelling:              Pulmonary      Oxygen at home:      Productive cough:       Wheezing:              Neurologic      Sudden weakness in arms or legs:       Sudden numbness in arms or legs:       Sudden onset of difficulty speaking or slurred speech:      Temporary loss of vision in one eye:       Problems with dizziness:              Gastrointestinal      Blood in stool:       Vomited blood:              Genitourinary      Burning when urinating:       Blood in urine:             Psychiatric      Major depression:              Hematologic      Bleeding problems:      Problems with blood clotting too easily:             Skin      Rashes or ulcers:             Constitutional      Fever or chills:          PHYSICAL EXAM:    Vitals:    04/13/21 1047  BP: (!) 185/78  Pulse: 65  Resp: 16  Temp: 97.6 F (36.4 C)  TempSrc: Temporal  SpO2: 98%  Weight: 214 lb (97.1 kg)  Height: 6' (1.829 m)      GENERAL: The patient is a well-nourished male, in no acute distress. The vital signs are documented above. CARDIAC: There is a regular rate and rhythm.  VASCULAR:  Palpable radial and  brachial pulses bilateral upper extremities Right IJ tunneled catheter PULMONARY: No respiratory distress. ABDOMEN: Soft and non-tender. MUSCULOSKELETAL: There are no major deformities or cyanosis. NEUROLOGIC: No focal weakness or paresthesias are detected. SKIN: There are no ulcers or rashes noted. PSYCHIATRIC: The patient has a normal affect.   DATA:    Upper extremity arterial duplex shows triphasic waveforms in both upper extremities.   Upper extremity vein map shows usable  cephalic and basilic vein in the left arm.   Assessment/Plan:   67-year male with multiple comorbidities including end-stage renal disease that presents for permanent hemodialysis access evaluation.  I discussed plans for placement in the nondominant arm which would be his left arm.  He has easily palpable radial and brachial pulse for good inflow.  Vein mapping shows an excellent basilic vein with a likely usable cephalic vein as well.  Discussed evaluating these with ultrasound in the OR and plan for left arm AV fistula access.  Risk and benefits discussed in detail including risk of failure to mature and steal syndrome among other risks like bleeding and infection.  We will get him scheduled today.  All questions answered.     Marty Heck, MD [facility] Office: [phone]

## 2021-04-21 NOTE — Op Note (Signed)
OPERATIVE NOTE   PROCEDURE: left brachiocephalic arteriovenous fistula placement  PRE-OPERATIVE DIAGNOSIS: end stage renal disease  POST-OPERATIVE DIAGNOSIS: same as above   SURGEON: Marty Heck, MD  ASSISTANT(S): Arlee Muslim, PA  ANESTHESIA: regional  ESTIMATED BLOOD LOSS: Minimal  FINDING(S): 1.  Cephalic vein: 3.5 mm, acceptable 2.  Brachial artery: 5 mm, atherosclerotic disease evident 3.  Venous outflow: palpable thrill  4.  Radial flow: palpable radial pulse  SPECIMEN(S):  none  INDICATIONS:   Darryl Diaz is a 80 y.o. male who presents with ESRD and need for permanent hemodialysis access.  The patient is scheduled for left arm arteriovenous fistula placement.  The patient is aware the risks include but are not limited to: bleeding, infection, steal syndrome, nerve damage, ischemic monomelic neuropathy, failure to mature, and need for additional procedures.  The patient is aware of the risks of the procedure and elects to proceed forward.   DESCRIPTION: After full informed written consent was obtained from the patient, the patient was brought back to the operating room and placed supine upon the operating table.  Prior to induction, the patient received IV antibiotics.   After obtaining adequate anesthesia, the patient was then prepped and draped in the standard fashion for a left arm access procedure.  I turned my attention first to identifying the patient's cephalic vein at the elbow and this was of good caliber and I also identified the brachial artery.  Using SonoSite guidance, the location of these vessels were marked out on the skin.     I made a transverse incision at the level of the antecubitum and dissected through the subcutaneous tissue and fascia to gain exposure of the brachial artery.  This was noted to be 5 mm in diameter externally.  This was dissected out proximally and distally and controlled with vessel loops .  I then dissected out the  cephalic vein.  This was noted to be 3.5 mm in diameter externally.  The distal segment of the vein was ligated with a  2-0 silk, and the vein was transected.  The proximal segment was interrogated with serial dilators.  The vein accepted up to a 5 mm dilator without any difficulty.  I then instilled the heparinized saline into the vein and clamped it.  At this point, I reset my exposure of the brachial artery.  The patient was given 3,000 units IV heparin.  I then placed the artery under tension proximally and distally.  I made an arteriotomy with a #11 blade, and then I extended the arteriotomy with a Potts scissor.  I injected heparinized saline proximal and distal to this arteriotomy.  The vein was then sewn to the artery in an end-to-side configuration with a running stitch of 6-0 Prolene.  Prior to completing this anastomosis, I allowed the vein and artery to backbleed.  There was no evidence of clot from any vessels.  I completed the anastomosis in the usual fashion and then released all vessel loops and clamps.    There was a palpable thrill in the venous outflow, and there was a palpable radial pulse.  At this point, I irrigated out the surgical wound.  There was no further active bleeding.  The subcutaneous tissue was reapproximated with a running stitch of 3-0 Vicryl.  The skin was then reapproximated with a running subcuticular stitch of 4-0 Monocryl.  The skin was then cleaned, dried, and reinforced with Dermabond.  The patient tolerated this procedure well.   COMPLICATIONS:  None  CONDITION: Stable  Marty Heck, MD [facility] Office: [place]   04/21/2021, 11:34 AM

## 2021-04-21 NOTE — Progress Notes (Signed)
Attempted to call report to [facility] multiple times/multiple different numbers to facility without success. Transportation en route.

## 2021-04-21 NOTE — Transfer of Care (Signed)
Immediate Anesthesia Transfer of Care Note  Patient: Darryl Diaz  Procedure(s) Performed: LEFT ARM ARTERIOVENOUS (AV) FISTULA CREATION - Left brachiocephalic fistula (Left: Arm Lower)  Patient Location: PACU  Anesthesia Type:MAC combined with regional for post-op pain  Level of Consciousness: awake, alert  and patient cooperative  Airway & Oxygen Therapy: Patient Spontanous Breathing  Post-op Assessment: Report given to RN and Post -op Vital signs reviewed and stable  Post vital signs: Reviewed and stable  Last Vitals:  Vitals Value Taken Time  BP 177/65 04/21/21 1128  Temp    Pulse 72 04/21/21 1129  Resp 19 04/21/21 1129  SpO2 96 % 04/21/21 1129  Vitals shown include unvalidated device data.  Last Pain:  Vitals:   04/21/21 0937  TempSrc: Oral  PainSc: 0-No pain         Complications: No notable events documented.

## 2021-04-21 NOTE — Anesthesia Preprocedure Evaluation (Addendum)
Anesthesia Evaluation  Patient identified by MRN, date of birth, ID band Patient awake    Reviewed: Allergy & Precautions, NPO status , Patient's Chart, lab work & pertinent test results, reviewed documented beta blocker date and time   History of Anesthesia Complications Negative for: history of anesthetic complications  Airway Mallampati: II  TM Distance: >3 FB Neck ROM: Full    Dental  (+) Dental Advisory Given   Pulmonary sleep apnea (no longer uses his CPAP) , former smoker,    breath sounds clear to auscultation       Cardiovascular hypertension, Pt. on medications and Pt. on home beta blockers + dysrhythmias Atrial Fibrillation  Rhythm:Regular Rate:Normal  01/2021 ECHO: EF 60-65%. The L ventricle has normal function, no regional wall motion abnormalities. There is mild concentric LVH, trivial MR, mild AS   Neuro/Psych Depression negative neurological ROS     GI/Hepatic GERD  Medicated and Controlled,(+) Cirrhosis   Esophageal Varices  substance abuse  alcohol use,   Endo/Other  negative endocrine ROS  Renal/GU ESRF and DialysisRenal disease (dialyzed yesterday, K+ 4.2)     Musculoskeletal  (+) Arthritis ,   Abdominal   Peds  Hematology  (+) Blood dyscrasia (Hb 11.2), anemia ,   Anesthesia Other Findings   Reproductive/Obstetrics                            Anesthesia Physical Anesthesia Plan  ASA: 3  Anesthesia Plan: Regional and MAC   Post-op Pain Management: Regional block   Induction:   PONV Risk Score and Plan: 1 and Ondansetron and Treatment may vary due to age or medical condition  Airway Management Planned: Natural Airway and Simple Face Mask  Additional Equipment: None  Intra-op Plan:   Post-operative Plan:   Informed Consent: I have reviewed the patients History and Physical, chart, labs and discussed the procedure including the risks, benefits and alternatives  for the proposed anesthesia with the patient or authorized representative who has indicated his/her understanding and acceptance.     Dental advisory given  Plan Discussed with: CRNA and Surgeon  Anesthesia Plan Comments: (Plan routine monitors, supraclavicular block)        Anesthesia Quick Evaluation

## 2021-04-21 NOTE — Anesthesia Procedure Notes (Signed)
Anesthesia Regional Block: Supraclavicular block   Pre-Anesthetic Checklist: , timeout performed,  Correct Patient, Correct Site, Correct Laterality,  Correct Procedure, Correct Position, site marked,  Risks and benefits discussed,  Surgical consent,  Pre-op evaluation,  At surgeon's request and post-op pain management  Laterality: Left and Upper  Prep: chloraprep       Needles:  Injection technique: Single-shot  Needle Type: Echogenic Needle     Needle Length: 9cm  Needle Gauge: 21     Additional Needles:   Procedures:,,,, ultrasound used (permanent image in chart),,    Narrative:  Start time: 04/21/2021 9:52 AM End time: 04/21/2021 9:58 AM Injection made incrementally with aspirations every 5 mL.  Performed by: Personally  Anesthesiologist: Annye Asa, MD  Additional Notes: Pt identified in Holding room.  Monitors applied. Working IV access confirmed. Sterile prep L clavicle and neck.  #21ga ECHOgenic Arrow block needle to supraclav brachial plexus with US guidance.  25cc 1.5% Lidocaine with 1:200k epi, exparel injected incrementally after negative test dose.  Patient asymptomatic, VSS, no heme aspirated, tolerated well.   Jenita Seashore, MD

## 2021-04-21 NOTE — Discharge Instructions (Signed)
° °  [facility] ° °Discharge Instructions ° °AV Fistula or Graft Surgery for Dialysis Access ° °Please refer to the following instructions for your post-procedure care. Your surgeon or physician assistant will discuss any changes with you. ° °Activity ° °You may drive the day following your surgery, if you are comfortable and no longer taking prescription pain medication. Resume full activity as the soreness in your incision resolves. ° °Bathing/Showering ° °You may shower after you go home. Keep your incision dry for 48 hours. Do not soak in a bathtub, hot tub, or swim until the incision heals completely. You may not shower if you have a hemodialysis catheter. ° °Incision Care ° °Clean your incision with mild soap and water after 48 hours. Pat the area dry with a clean towel. You do not need a bandage unless otherwise instructed. Do not apply any ointments or creams to your incision. You may have skin glue on your incision. Do not peel it off. It will come off on its own in about one week. Your arm may swell a bit after surgery. To reduce swelling use pillows to elevate your arm so it is above your heart. Your doctor will tell you if you need to lightly wrap your arm with an ACE bandage. ° °Diet ° °Resume your normal diet. There are not special food restrictions following this procedure. In order to heal from your surgery, it is CRITICAL to get adequate nutrition. Your body requires vitamins, minerals, and protein. Vegetables are the best source of vitamins and minerals. Vegetables also provide the perfect balance of protein. Processed food has little nutritional value, so try to avoid this. ° °Medications ° °Resume taking all of your medications. If your incision is causing pain, you may take over-the counter pain relievers such as acetaminophen (Tylenol). If you were prescribed a stronger pain medication, please be aware these medications can cause nausea and constipation. Prevent  nausea by taking the medication with a snack or meal. Avoid constipation by drinking plenty of fluids and eating foods with high amount of fiber, such as fruits, vegetables, and grains. Do not take Tylenol if you are taking prescription pain medications. ° ° ° ° °Follow up °Your surgeon may want to see you in the office following your access surgery. If so, this will be arranged at the time of your surgery. ° °Please call us immediately for any of the following conditions: ° °Increased pain, redness, drainage (pus) from your incision site °Fever of 101 degrees or higher °Severe or worsening pain at your incision site °Hand pain or numbness. ° °Reduce your risk of vascular disease: ° °Stop smoking. If you would like help, call [facility] at 1-800-QUIT-NOW (1-800-784-8669) or [facility] at 336-586-4000 ° °Manage your cholesterol °Maintain a desired weight °Control your diabetes °Keep your blood pressure down ° °Dialysis ° °It will take several weeks to several months for your new dialysis access to be ready for use. Your surgeon will determine when it is OK to use it. Your nephrologist will continue to direct your dialysis. You can continue to use your Permcath until your new access is ready for use. ° °If you have any questions, please call the office at [phone]. ° °

## 2021-04-21 NOTE — Progress Notes (Signed)
Orthopedic Tech Progress Note Patient Details:  Darryl Diaz 1940/11/23 [id number]  Ortho Devices Type of Ortho Device: Arm sling Ortho Device/Splint Interventions: Ordered   Delivered sling and spoke with RN   Darryl Diaz 04/21/2021, 12:12 PM

## 2021-04-22 ENCOUNTER — Encounter (HOSPITAL_COMMUNITY): Payer: Self-pay | Admitting: Vascular Surgery

## 2021-04-23 DIAGNOSIS — Z23 Encounter for immunization: Secondary | ICD-10-CM | POA: Diagnosis not present

## 2021-04-23 DIAGNOSIS — N186 End stage renal disease: Secondary | ICD-10-CM | POA: Diagnosis not present

## 2021-04-23 DIAGNOSIS — D631 Anemia in chronic kidney disease: Secondary | ICD-10-CM | POA: Diagnosis not present

## 2021-04-23 DIAGNOSIS — Z992 Dependence on renal dialysis: Secondary | ICD-10-CM | POA: Diagnosis not present

## 2021-04-23 DIAGNOSIS — N2581 Secondary hyperparathyroidism of renal origin: Secondary | ICD-10-CM | POA: Diagnosis not present

## 2021-04-24 ENCOUNTER — Encounter (HOSPITAL_COMMUNITY): Payer: Self-pay | Admitting: Vascular Surgery

## 2021-04-24 NOTE — Anesthesia Postprocedure Evaluation (Signed)
Anesthesia Post Note  Patient: JOSHUAH MINELLA  Procedure(s) Performed: LEFT ARM ARTERIOVENOUS (AV) FISTULA CREATION - Left brachiocephalic fistula (Left: Arm Lower)     Patient location during evaluation: PACU Anesthesia Type: Regional and MAC Level of consciousness: awake and alert Pain management: pain level controlled Vital Signs Assessment: post-procedure vital signs reviewed and stable Respiratory status: spontaneous breathing, nonlabored ventilation, respiratory function stable and patient connected to nasal cannula oxygen Cardiovascular status: stable and blood pressure returned to baseline Postop Assessment: no apparent nausea or vomiting Anesthetic complications: no   No notable events documented.  Last Vitals:  Vitals:   04/21/21 1213 04/21/21 1228  BP: (!) 126/59 (!) 147/63  Pulse: 65 (!) 59  Resp: 19 18  Temp:  37 C  SpO2: 97% 96%    Last Pain:  Vitals:   04/21/21 1158  TempSrc:   PainSc: 0-No pain                 Anaissa Macfadden S

## 2021-04-26 DIAGNOSIS — Z992 Dependence on renal dialysis: Secondary | ICD-10-CM | POA: Diagnosis not present

## 2021-04-26 DIAGNOSIS — N186 End stage renal disease: Secondary | ICD-10-CM | POA: Diagnosis not present

## 2021-04-26 DIAGNOSIS — Z23 Encounter for immunization: Secondary | ICD-10-CM | POA: Diagnosis not present

## 2021-04-26 DIAGNOSIS — D631 Anemia in chronic kidney disease: Secondary | ICD-10-CM | POA: Diagnosis not present

## 2021-04-26 DIAGNOSIS — N2581 Secondary hyperparathyroidism of renal origin: Secondary | ICD-10-CM | POA: Diagnosis not present

## 2021-04-28 DIAGNOSIS — Z23 Encounter for immunization: Secondary | ICD-10-CM | POA: Diagnosis not present

## 2021-04-28 DIAGNOSIS — N2581 Secondary hyperparathyroidism of renal origin: Secondary | ICD-10-CM | POA: Diagnosis not present

## 2021-04-28 DIAGNOSIS — D631 Anemia in chronic kidney disease: Secondary | ICD-10-CM | POA: Diagnosis not present

## 2021-04-28 DIAGNOSIS — N186 End stage renal disease: Secondary | ICD-10-CM | POA: Diagnosis not present

## 2021-04-28 DIAGNOSIS — Z992 Dependence on renal dialysis: Secondary | ICD-10-CM | POA: Diagnosis not present

## 2021-04-30 DIAGNOSIS — Z23 Encounter for immunization: Secondary | ICD-10-CM | POA: Diagnosis not present

## 2021-04-30 DIAGNOSIS — Z992 Dependence on renal dialysis: Secondary | ICD-10-CM | POA: Diagnosis not present

## 2021-04-30 DIAGNOSIS — N2581 Secondary hyperparathyroidism of renal origin: Secondary | ICD-10-CM | POA: Diagnosis not present

## 2021-04-30 DIAGNOSIS — D631 Anemia in chronic kidney disease: Secondary | ICD-10-CM | POA: Diagnosis not present

## 2021-04-30 DIAGNOSIS — N186 End stage renal disease: Secondary | ICD-10-CM | POA: Diagnosis not present

## 2021-05-01 ENCOUNTER — Observation Stay (HOSPITAL_COMMUNITY)
Admission: EM | Admit: 2021-05-01 | Discharge: 2021-05-02 | Disposition: A | Discharge status: Home / Self Care | Payer: Medicare Other | Attending: Family Medicine | Admitting: Family Medicine

## 2021-05-01 ENCOUNTER — Emergency Department (HOSPITAL_COMMUNITY): Payer: Medicare Other

## 2021-05-01 ENCOUNTER — Encounter (HOSPITAL_COMMUNITY): Payer: Self-pay

## 2021-05-01 DIAGNOSIS — D72829 Elevated white blood cell count, unspecified: Secondary | ICD-10-CM | POA: Diagnosis not present

## 2021-05-01 DIAGNOSIS — R531 Weakness: Secondary | ICD-10-CM

## 2021-05-01 DIAGNOSIS — I2510 Atherosclerotic heart disease of native coronary artery without angina pectoris: Secondary | ICD-10-CM | POA: Diagnosis not present

## 2021-05-01 DIAGNOSIS — Z20822 Contact with and (suspected) exposure to covid-19: Secondary | ICD-10-CM | POA: Insufficient documentation

## 2021-05-01 DIAGNOSIS — I739 Peripheral vascular disease, unspecified: Secondary | ICD-10-CM | POA: Diagnosis not present

## 2021-05-01 DIAGNOSIS — W19XXXA Unspecified fall, initial encounter: Secondary | ICD-10-CM

## 2021-05-01 DIAGNOSIS — Z96643 Presence of artificial hip joint, bilateral: Secondary | ICD-10-CM | POA: Insufficient documentation

## 2021-05-01 DIAGNOSIS — I132 Hypertensive heart and chronic kidney disease with heart failure and with stage 5 chronic kidney disease, or end stage renal disease: Secondary | ICD-10-CM | POA: Insufficient documentation

## 2021-05-01 DIAGNOSIS — E7849 Other hyperlipidemia: Secondary | ICD-10-CM

## 2021-05-01 DIAGNOSIS — E871 Hypo-osmolality and hyponatremia: Secondary | ICD-10-CM | POA: Diagnosis not present

## 2021-05-01 DIAGNOSIS — S199XXA Unspecified injury of neck, initial encounter: Secondary | ICD-10-CM | POA: Diagnosis not present

## 2021-05-01 DIAGNOSIS — R509 Fever, unspecified: Secondary | ICD-10-CM | POA: Diagnosis not present

## 2021-05-01 DIAGNOSIS — Z79899 Other long term (current) drug therapy: Secondary | ICD-10-CM | POA: Insufficient documentation

## 2021-05-01 DIAGNOSIS — N179 Acute kidney failure, unspecified: Secondary | ICD-10-CM | POA: Diagnosis not present

## 2021-05-01 DIAGNOSIS — R269 Unspecified abnormalities of gait and mobility: Secondary | ICD-10-CM

## 2021-05-01 DIAGNOSIS — Z87891 Personal history of nicotine dependence: Secondary | ICD-10-CM | POA: Insufficient documentation

## 2021-05-01 DIAGNOSIS — Y900 Blood alcohol level of less than 20 mg/100 ml: Secondary | ICD-10-CM | POA: Diagnosis not present

## 2021-05-01 DIAGNOSIS — W1839XA Other fall on same level, initial encounter: Secondary | ICD-10-CM | POA: Diagnosis not present

## 2021-05-01 DIAGNOSIS — R270 Ataxia, unspecified: Secondary | ICD-10-CM

## 2021-05-01 DIAGNOSIS — Z992 Dependence on renal dialysis: Secondary | ICD-10-CM

## 2021-05-01 DIAGNOSIS — F1010 Alcohol abuse, uncomplicated: Secondary | ICD-10-CM | POA: Diagnosis present

## 2021-05-01 DIAGNOSIS — K7030 Alcoholic cirrhosis of liver without ascites: Secondary | ICD-10-CM | POA: Diagnosis present

## 2021-05-01 DIAGNOSIS — M4322 Fusion of spine, cervical region: Secondary | ICD-10-CM | POA: Diagnosis not present

## 2021-05-01 DIAGNOSIS — N186 End stage renal disease: Secondary | ICD-10-CM | POA: Diagnosis not present

## 2021-05-01 DIAGNOSIS — R55 Syncope and collapse: Secondary | ICD-10-CM | POA: Diagnosis present

## 2021-05-01 DIAGNOSIS — E785 Hyperlipidemia, unspecified: Secondary | ICD-10-CM | POA: Diagnosis present

## 2021-05-01 DIAGNOSIS — I5032 Chronic diastolic (congestive) heart failure: Secondary | ICD-10-CM | POA: Diagnosis not present

## 2021-05-01 DIAGNOSIS — R42 Dizziness and giddiness: Secondary | ICD-10-CM | POA: Diagnosis not present

## 2021-05-01 DIAGNOSIS — R0689 Other abnormalities of breathing: Secondary | ICD-10-CM | POA: Diagnosis not present

## 2021-05-01 DIAGNOSIS — N401 Enlarged prostate with lower urinary tract symptoms: Secondary | ICD-10-CM | POA: Diagnosis present

## 2021-05-01 DIAGNOSIS — I6523 Occlusion and stenosis of bilateral carotid arteries: Secondary | ICD-10-CM | POA: Diagnosis not present

## 2021-05-01 DIAGNOSIS — S0990XA Unspecified injury of head, initial encounter: Secondary | ICD-10-CM | POA: Diagnosis not present

## 2021-05-01 DIAGNOSIS — I672 Cerebral atherosclerosis: Secondary | ICD-10-CM | POA: Diagnosis not present

## 2021-05-01 DIAGNOSIS — I959 Hypotension, unspecified: Secondary | ICD-10-CM | POA: Diagnosis not present

## 2021-05-01 HISTORY — DX: End stage renal disease: N18.6

## 2021-05-01 LAB — BASIC METABOLIC PANEL
Anion gap: 12 (ref 5–15)
BUN: 23 mg/dL (ref 8–23)
CO2: 26 mmol/L (ref 22–32)
Calcium: 8.6 mg/dL — ABNORMAL LOW (ref 8.9–10.3)
Chloride: 92 mmol/L — ABNORMAL LOW (ref 98–111)
Creatinine, Ser: 3.68 mg/dL — ABNORMAL HIGH (ref 0.61–1.24)
GFR, Estimated: 16 mL/min — ABNORMAL LOW (ref >60)
Glucose, Bld: 84 mg/dL (ref 70–99)
Potassium: 4.8 mmol/L (ref 3.5–5.1)
Sodium: 130 mmol/L — ABNORMAL LOW (ref 135–145)

## 2021-05-01 LAB — CBC WITH DIFFERENTIAL/PLATELET
Abs Immature Granulocytes: 0.07 10*3/uL (ref 0.00–0.07)
Basophils Absolute: 0 10*3/uL (ref 0.0–0.1)
Basophils Relative: 0 %
Eosinophils Absolute: 0 10*3/uL (ref 0.0–0.5)
Eosinophils Relative: 0 %
HCT: 35.3 % — ABNORMAL LOW (ref 39.0–52.0)
Hemoglobin: 11.5 g/dL — ABNORMAL LOW (ref 13.0–17.0)
Immature Granulocytes: 1 %
Lymphocytes Relative: 7 %
Lymphs Abs: 0.8 10*3/uL (ref 0.7–4.0)
MCH: 34.4 pg — ABNORMAL HIGH (ref 26.0–34.0)
MCHC: 32.6 g/dL (ref 30.0–36.0)
MCV: 105.7 fL — ABNORMAL HIGH (ref 80.0–100.0)
Monocytes Absolute: 1 10*3/uL (ref 0.1–1.0)
Monocytes Relative: 8 %
Neutro Abs: 9.8 10*3/uL — ABNORMAL HIGH (ref 1.7–7.7)
Neutrophils Relative %: 84 %
Platelets: 161 10*3/uL (ref 150–400)
RBC: 3.34 MIL/uL — ABNORMAL LOW (ref 4.22–5.81)
RDW: 15.6 % — ABNORMAL HIGH (ref 11.5–15.5)
WBC: 11.6 10*3/uL — ABNORMAL HIGH (ref 4.0–10.5)
nRBC: 0 % (ref 0.0–0.2)

## 2021-05-01 MED ORDER — SODIUM CHLORIDE 0.9 % IV BOLUS
500.0000 mL | Freq: Once | INTRAVENOUS | Status: AC
  Administered 2021-05-01: 23:00:00 500 mL via INTRAVENOUS

## 2021-05-01 NOTE — ED Notes (Signed)
Pt given urinal, repositioned in bed

## 2021-05-01 NOTE — H&P (Signed)
History and Physical   Darryl Diaz HYW:[id number] DOB: 1940-11-29 DOA: 05/01/2021  Referring MD/NP/PA: Dr. Kathrynn Humble  PCP: Osborne Casco [name], MD   Outpatient Specialists: Dr. Monica Martinez, vascular surgery  Patient coming from: [facility]  Chief Complaint: Recurrent falls  HPI: Darryl Diaz is a 80 y.o. 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  He has had gait instability for well but today he is feeling slightly different.  He has been dizzy and falling.  He is apparently orthostatic in the ER.  Patient has no prior history of strokes.  Initial head CT was negative.  Patient appears to be failing to thrive in the independent living.  At this point he is being admitted with recurrent syncope versus fall.  Work-up as well as treatment..  ED Course: Temperature is 98.2 blood pressure 140/63, pulse 72 respirate 24 oxygen sat 92% on room air.  White count is 11.6, hemoglobin 11.5 and platelets 161.  Sodium 130 potassium 4.8 chloride 92 CO2 26 BUN is 23 creatinine 3.69 calcium 8.6.  CT head as well as CT cervical spine negative.  Chest x-ray showed no acute findings.  Respiratory panel is currently pending.  Patient being admitted for evaluation of recurrent falls.  Review of Systems: As per HPI otherwise 10 point review of systems negative.    Past Medical History:  Diagnosis Date   Alcohol abuse 48/54/6270   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[place]) 03/17/2020   Alcoholism ([place])    in remission following wife's death   Anemia    Ascending aorta dilation (HCC)    Atrial fibrillation (HCC)    Cardiac  arrest ([place]) 04/20/2019   12 min CPR with epi   Chronic diastolic CHF (congestive heart failure) ([place])    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[place])    Fatty liver    Gastritis    GERD (gastroesophageal reflux disease)    H/O seasonal allergies    History of GI bleed    Hx of adenomatous colonic polyps 08/10/2017   Hyperlipidemia    Hypertension    Insomnia    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   Mallory-Weiss tear    OA (osteoarthritis)    hands   OSA (obstructive sleep apnea)    No longer uses CPAP   Persistent atrial fibrillation with rapid ventricular response ([place]) 02/05/2020   Recurrent syncope    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[facility])     Past Surgical History:  Procedure Laterality Date   ANKLE SURGERY Left 2014   had rods put in    AV FISTULA PLACEMENT Left 04/21/2021   Procedure: LEFT ARM ARTERIOVENOUS (AV) FISTULA CREATION - Left brachiocephalic fistula;  Surgeon: Marty Heck, MD;  Location: [place];  Service: Vascular;  Laterality: Left;   BIOPSY  02/28/2019   Procedure: BIOPSY;  Surgeon: Milus Banister, MD;  Location: [place];  Service: Endoscopy;;   CARDIOVERSION  02/06/2020   CARDIOVERSION N/A 02/06/2020   Procedure: CARDIOVERSION;  Surgeon: Werner Lean, MD;  Location: [place] [facility]OSCOPY;  Service: Cardiovascular;  Laterality: N/A;   CARDIOVERSION N/A 02/24/2020   Procedure: CARDIOVERSION;  Surgeon: Werner Lean, MD;  Location: [place];  Service: Cardiovascular;  Laterality: N/A;   COLONOSCOPY     [place]   COLONOSCOPY WITH PROPOFOL N/A 01/08/2021   Procedure: COLONOSCOPY WITH PROPOFOL;  Surgeon: Milus Banister, MD;  Location[facility] END[facility]COPY;  Service: Endoscopy;  Laterality: N/A;   ESOPHAGOGASTRODUODENOSCOPY (EGD) WITH PROPOFOL N/A 02/28/2019   Procedure: ESOPHAGOGASTRODUODENOSCOPY (EGD) WITH PROPOFOL;  Surgeon: Milus Banister, MD;  Location: [facility] ENDOSCOPY;  Service: Endoscopy;  Laterality: N/A;   HIP ARTHROPLASTY Left  04/01/2016   Procedure: ARTHROPLASTY BIPOLAR HIP (HEMIARTHROPLASTY);  Surgeon: Paralee Cancel, MD;  Location: [place];  Service: Orthopedics;  Laterality: Left;   HIP CLOSED REDUCTION Left 04/30/2016   Procedure: CLOSED REDUCTION HIP;  Surgeon: Wylene Simmer, MD;  Location: [place];  Service: Orthopedics;  Laterality: Left;   HOT HEMOSTASIS N/A 01/08/2021   Procedure: HOT HEMOSTASIS (ARGON PLASMA COAGULATION/BICAP);  Surgeon: Milus Banister, MD;  Location[facility];  Service: Endoscopy;  Laterality: N/A;   IR RADIOLOGIST EVAL & MGMT  12/31/2020   TONSILLECTOMY     TOTAL HIP ARTHROPLASTY Right 10/15/2019   Procedure: TOTAL HIP ARTHROPLASTY ANTERIOR APPROACH;  Surgeon: Paralee Cancel, MD;  Location: [facility];  Service: Orthopedics;  Laterality: Right;  70 mins     reports that he has quit smoking. His smoking use included cigarettes. He has a 20.00 pack-year smoking history. He has never used smokeless tobacco. He reports current alcohol use of about 3.0 standard drinks per week. He reports that he does not use drugs.  Allergies  Allergen Reactions   Penicillins Rash    Family History  Problem Relation Age of Onset   Heart disease Mother 72   Hypertension Mother    Arthritis Mother    Heart failure Father 35   Stroke Maternal Aunt    Heart failure Sister    Colon cancer Neg Hx    Esophageal cancer Neg Hx    Stomach cancer Neg Hx    Rectal cancer Neg Hx      Prior to Admission medications   Medication Sig Start Date End Date Taking? Authorizing Provider  acetaminophen (TYLENOL) 325 MG tablet Take 2 tablets (650 mg total) by mouth every 4 (four) hours as needed for headache or mild pain. 03/10/20  Yes Angiulli, Lavon Paganini, PA-C  amiodarone (PACERONE) 200 MG tablet Take 1 tablet (200 mg total) by mouth daily. 04/10/20  Yes Dunn, Dayna N, PA-C  atorvastatin (LIPITOR) 20 MG tablet Take 1 tablet (20 mg total) by mouth daily. 03/10/20  Yes Angiulli, Lavon Paganini, PA-C  carvedilol (COREG) 6.25 MG tablet  Take 1 tablet (6.25 mg total) by mouth 2 (two) times daily. 02/11/21  Yes Sherren Mocha, MD  cetirizine (ZYRTEC) 10 MG tablet Take 10 mg by mouth daily.   Yes [provider]  escitalopram (LEXAPRO) 10 MG tablet Take 1 tablet (10 mg total) by mouth daily. 03/10/20  Yes Angiulli, Lavon Paganini, PA-C  folic acid (FOLVITE) 063 MCG tablet Take 400 mcg by mouth daily.   Yes [provider]  HYDROcodone-acetaminophen (NORCO) 5-325 MG tablet Take 1 tablet by mouth every 6 (six) hours as needed for moderate pain. 04/21/21  Yes Dagoberto Ligas, PA-C  ketoconazole (NIZORAL) 2 % cream Apply 1 application topically daily. To dry scaly skin on feet Patient taking differently: Apply 1 application topically daily as needed for irritation. 07/23/20  Yes McDonald, Stephan Minister, DPM  Magnesium Oxide 400 MG CAPS Take 1 capsule (400 mg total) by mouth daily. 10/17/20  Yes Eugenie Filler, MD  Multiple Vitamins-Minerals (  CENTRUM SILVER 50+MEN) TABS Take 1 tablet by mouth daily with breakfast.   Yes [provider]  nitroGLYCERIN (NITROSTAT) 0.4 MG SL tablet Place 1 tablet (0.4 mg total) under the tongue every 5 (five) minutes x 3 doses as needed for chest pain. 07/28/20  Yes Baldwin Jamaica, PA-C  nystatin (MYCOSTATIN/NYSTOP) powder Apply topically 2 (two) times daily. Patient taking differently: Apply 1 application topically 2 (two) times daily as needed. 01/22/21  Yes Antonieta Pert, MD  pantoprazole (PROTONIX) 40 MG tablet Take 1 tablet (40 mg total) by mouth daily. 03/10/20  Yes Angiulli, Lavon Paganini, PA-C  potassium chloride SA (KLOR-CON) 20 MEQ tablet Take 1 tablet (20 mEq total) by mouth daily. 10/17/20 10/12/21 Yes Eugenie Filler, MD  sodium bicarbonate 650 MG tablet Take 1 tablet (650 mg total) by mouth 2 (two) times daily. 01/22/21  Yes Antonieta Pert, MD  torsemide 40 MG TABS Take 40 mg by mouth daily. 01/22/21  Yes Antonieta Pert, MD  traZODone (DESYREL) 50 MG tablet Take 1 tablet (50 mg total) by mouth at  bedtime. 03/10/20  Yes Angiulli, Lavon Paganini, PA-C  ferrous sulfate 325 (65 FE) MG tablet Take 1 tablet (325 mg total) by mouth daily. Patient not taking: Reported on 05/01/2021 12/03/20 02/03/21  Aline August, MD  folic acid (FOLVITE) 1 MG tablet Take 1 tablet (1 mg total) by mouth daily. Patient not taking: Reported on 05/01/2021 03/10/20   Angiulli, Lavon Paganini, PA-C  loratadine (CLARITIN) 10 MG tablet Take 1 tablet (10 mg total) by mouth daily. Patient not taking: Reported on 05/01/2021 02/12/20   Nickola Major, NP    Physical Exam: Vitals:   05/01/21 1907 05/01/21 1908 05/01/21 2130 05/01/21 2230  BP:  (!) 149/64 140/63 (!) 149/72  Pulse:  72 72 71  Resp:  18 (!) 24 14  Temp:  98.2 F (36.8 C)    TempSrc:  Oral    SpO2: 100% 100% 92% 94%  Weight:  97.5 kg    Height:  6' (1.829 m)        Constitutional: Acutely ill looking no distress Vitals:   05/01/21 1907 05/01/21 1908 05/01/21 2130 05/01/21 2230  BP:  (!) 149/64 140/63 (!) 149/72  Pulse:  72 72 71  Resp:  18 (!) 24 14  Temp:  98.2 F (36.8 C)    TempSrc:  Oral    SpO2: 100% 100% 92% 94%  Weight:  97.5 kg    Height:  6' (1.829 m)     Eyes: PERRL, lids and conjunctivae normal ENMT: Mucous membranes are dry. Posterior pharynx clear of any exudate or lesions.Normal dentition.  Neck: normal, supple, no masses, no thyromegaly Respiratory: clear to auscultation bilaterally, no wheezing, no crackles. Normal respiratory effort. No accessory muscle use.  Cardiovascular: Irregularly irregular rate and rhythm, no murmurs / rubs / gallops. No extremity edema. 2+ pedal pulses. No carotid bruits.  Abdomen: no tenderness, no masses palpated. No hepatosplenomegaly. Bowel sounds positive.  Musculoskeletal: no clubbing / cyanosis. No joint deformity upper and lower extremities. Good ROM, no contractures. Normal muscle tone.  Skin: no rashes, lesions, ulcers. No induration Neurologic: CN 2-12 grossly intact. Sensation intact, DTR  normal. Strength 5/5 in all 4.  Psychiatric: Normal judgment and insight. Alert and oriented x 3. Normal mood.     Labs on Admission: I have personally reviewed following labs and imaging studies  CBC: Recent Labs  Lab 05/01/21 2155  WBC 11.6*  NEUTROABS 9.8*  HGB 11.5*  HCT 35.3*  MCV 105.7*  PLT 545   Basic Metabolic Panel: Recent Labs  Lab 05/01/21 2155  NA 130*  K 4.8  CL 92*  CO2 26  GLUCOSE 84  BUN 23  CREATININE 3.68*  CALCIUM 8.6*   GFR: Estimated Creatinine Clearance: 19.7 mL/min (A) (by C-G formula based on SCr of 3.68 mg/dL (H)). Liver Function Tests: No results for input(s): AST, ALT, ALKPHOS, BILITOT, PROT, ALBUMIN in the last 168 hours. No results for input(s): LIPASE, AMYLASE in the last 168 hours. No results for input(s): AMMONIA in the last 168 hours. Coagulation Profile: No results for input(s): INR, PROTIME in the last 168 hours. Cardiac Enzymes: No results for input(s): CKTOTAL, CKMB, CKMBINDEX, TROPONINI in the last 168 hours. BNP (last 3 results) No results for input(s): PROBNP in the last 8760 hours. HbA1C: No results for input(s): HGBA1C in the last 72 hours. CBG: No results for input(s): GLUCAP in the last 168 hours. Lipid Profile: No results for input(s): CHOL, HDL, LDLCALC, TRIG, CHOLHDL, LDLDIRECT in the last 72 hours. Thyroid Function Tests: No results for input(s): TSH, T4TOTAL, FREET4, T3FREE, THYROIDAB in the last 72 hours. Anemia Panel: No results for input(s): VITAMINB12, FOLATE, FERRITIN, TIBC, IRON, RETICCTPCT in the last 72 hours. Urine analysis:    Component Value Date/Time   COLORURINE AMBER (A) 01/17/2021 0425   APPEARANCEUR CLOUDY (A) 01/17/2021 0425   LABSPEC 1.010 01/17/2021 0425   PHURINE 7.0 01/17/2021 0425   GLUCOSEU NEGATIVE 01/17/2021 0425   HGBUR LARGE (A) 01/17/2021 0425   BILIRUBINUR MODERATE (A) 01/17/2021 0425   KETONESUR NEGATIVE 01/17/2021 0425   PROTEINUR >300 (A) 01/17/2021 0425   NITRITE  POSITIVE (A) 01/17/2021 0425   LEUKOCYTESUR LARGE (A) 01/17/2021 0425   Sepsis Labs: @LABRCNTIP (procalcitonin:4,lacticidven:4) )No results found for this or any previous visit (from the past 240 hour(s)).   Radiological Exams on Admission: CT Head Wo Contrast  Result Date: 05/01/2021 CLINICAL DATA:  Neck trauma.  Fall. EXAM: CT HEAD WITHOUT CONTRAST CT CERVICAL SPINE WITHOUT CONTRAST TECHNIQUE: Multidetector CT imaging of the head and cervical spine was performed following the standard protocol without intravenous contrast. Multiplanar CT image reconstructions of the cervical spine were also generated. COMPARISON:  01/16/2021 FINDINGS: CT HEAD FINDINGS Brain: There is no mass, hemorrhage or extra-axial collection. There is generalized atrophy without lobar predilection. There is hypoattenuation of the periventricular white matter, most commonly indicating chronic ischemic microangiopathy. Vascular: Atherosclerotic calcification of the vertebral and internal carotid arteries at the skull base. No abnormal hyperdensity of the major intracranial arteries or dural venous sinuses. Skull: The visualized skull base, calvarium and extracranial soft tissues are normal. Sinuses/Orbits: No fluid levels or advanced mucosal thickening of the visualized paranasal sinuses. No mastoid or middle ear effusion. The orbits are normal. CT CERVICAL SPINE FINDINGS Alignment: No static subluxation. Facets are aligned. Occipital condyles are normally positioned. Skull base and vertebrae: No acute fracture. Soft tissues and spinal canal: No prevertebral fluid or swelling. No visible canal hematoma. Disc levels: There is multilevel facet hypertrophy. There is fusion of the left C3 and C4 facets and the right C4 and C5 facets. Upper chest: No pneumothorax, pulmonary nodule or pleural effusion. Other: Normal visualized paraspinal cervical soft tissues. IMPRESSION: 1. Chronic ischemic microangiopathy and generalized atrophy without  acute intracranial abnormality. 2. No acute fracture or static subluxation of the cervical spine. Cerebral Atrophy (ICD10-G31.9). Electronically Signed   By: Ulyses Jarred M.D.   On: 05/01/2021 23:05   CT Cervical Spine Wo  Contrast  Result Date: 05/01/2021 CLINICAL DATA:  Neck trauma.  Fall. EXAM: CT HEAD WITHOUT CONTRAST CT CERVICAL SPINE WITHOUT CONTRAST TECHNIQUE: Multidetector CT imaging of the head and cervical spine was performed following the standard protocol without intravenous contrast. Multiplanar CT image reconstructions of the cervical spine were also generated. COMPARISON:  01/16/2021 FINDINGS: CT HEAD FINDINGS Brain: There is no mass, hemorrhage or extra-axial collection. There is generalized atrophy without lobar predilection. There is hypoattenuation of the periventricular white matter, most commonly indicating chronic ischemic microangiopathy. Vascular: Atherosclerotic calcification of the vertebral and internal carotid arteries at the skull base. No abnormal hyperdensity of the major intracranial arteries or dural venous sinuses. Skull: The visualized skull base, calvarium and extracranial soft tissues are normal. Sinuses/Orbits: No fluid levels or advanced mucosal thickening of the visualized paranasal sinuses. No mastoid or middle ear effusion. The orbits are normal. CT CERVICAL SPINE FINDINGS Alignment: No static subluxation. Facets are aligned. Occipital condyles are normally positioned. Skull base and vertebrae: No acute fracture. Soft tissues and spinal canal: No prevertebral fluid or swelling. No visible canal hematoma. Disc levels: There is multilevel facet hypertrophy. There is fusion of the left C3 and C4 facets and the right C4 and C5 facets. Upper chest: No pneumothorax, pulmonary nodule or pleural effusion. Other: Normal visualized paraspinal cervical soft tissues. IMPRESSION: 1. Chronic ischemic microangiopathy and generalized atrophy without acute intracranial abnormality. 2.  No acute fracture or static subluxation of the cervical spine. Cerebral Atrophy (ICD10-G31.9). Electronically Signed   By: Ulyses Jarred M.D.   On: 05/01/2021 23:05      Assessment/Plan Principal Problem:   Fall Active Problems:   Hyperlipidemia   Hyponatremia   Weakness   Syncope and collapse   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HCC)   Abnormality of gait   Alcohol abuse   Benign prostatic hyperplasia with lower urinary tract symptoms   CAD (coronary artery disease)   ESRD on dialysis ([place])     #1 recurrent falls versus syncope: Patient has prior history of syncopal episodes.  This could be syncope.  Could be vasovagal.  Could also be cardiac related with known history of coronary artery disease.  CVA cannot be entirely ruled out.  We will admit the patient for observation.  Get MRI of the brain.  Echocardiogram.  Last echo was in September of this year.  PT and OT consultation for disposition.  #2 end-stage renal disease: Hemodialysis Mondays Wednesdays and Fridays.  Nephrology consult.  #3 hyponatremia: Possibly dehydration.  Goes to dialysis.  Defer to nephrology.  #4 essential hypertension: Confirm on resume home regimen.  #5 history of alcoholic cirrhosis: No evidence of portal hypertension.  Continue to monitor  #6 coronary artery disease: Stable at baseline  #7 hyperlipidemia: Continue statin   DVT prophylaxis: Heparin Code Status: DNR Family Communication: No family at bedside Disposition Plan: To be determined Consults called: Physical therapy and Occupational Therapy Admission status: Observation  Severity of Illness: The appropriate patient status for this patient is OBSERVATION. Observation status is judged to be reasonable and necessary in order to provide the required intensity of service to ensure the patient's safety. The patient's presenting symptoms, physical exam findings, and initial radiographic and laboratory data in the context of their  medical condition is felt to place them at decreased risk for further clinical deterioration. Furthermore, it is anticipated that the patient will be medically stable for discharge from the hospital within 2 midnights of admission.    Barbette Merino MD [facility] Pager [phone]  If 7PM-7AM, please contact night-coverage [email or web address] Password [facility]  05/01/2021, 11:41 PM

## 2021-05-01 NOTE — ED Notes (Signed)
Patient transported to CT 

## 2021-05-01 NOTE — ED Notes (Signed)
Pt requesting medicine for headache. MD made aware. MD also made aware of K+ 2.7

## 2021-05-01 NOTE — ED Triage Notes (Signed)
Pt BIBA for 2nd fall in 24 hour period. A/ox4, denies LOC, head/neck/back pain, has been in Eliquis but per pt has been taken off of it. Per pt, from ground level fall and states his legs gave out.

## 2021-05-01 NOTE — ED Notes (Signed)
MD Nanavati at bedside.  

## 2021-05-01 NOTE — ED Provider Notes (Addendum)
[facility] EMERGENCY DEPARTMENT Provider Note   CSN: [id number] Arrival date & time: 05/01/21  1847     History Chief Complaint  Patient presents with   Darryl Diaz is a 80 y.o. male.  HPI     80 year old male with history of alcoholism and resultant cirrhosis, ascending aorta dilation, A. fib, CHF, ESRD on HD comes in with chief complaint of fall.  Patient resides at an independent living facility.  He reports that he had 2 or 3 falls today.  All of the falls occurred when he was standing up.  He gets dizzy when he gets up.   Patient does not have any dizziness when he is resting.  Dizziness is described as unsteadiness and lightheadedness.  He usually walks with a walker.  Patient denies any fevers, chills, flulike symptoms.  Past Medical History:  Diagnosis Date   Alcohol abuse 64/40/3474   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[place]) 03/17/2020   Alcoholism ([place])    in remission following wife's death   Anemia    Ascending aorta dilation (HCC)    Atrial fibrillation (HCC)    Cardiac arrest ([place]) 04/20/2019   12 min CPR with epi   Chronic diastolic CHF (congestive heart failure) ([place])    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[place])    Fatty liver    Gastritis    GERD (gastroesophageal reflux disease)    H/O seasonal allergies    History of GI bleed    Hx of adenomatous colonic polyps 08/10/2017   Hyperlipidemia    Hypertension    Insomnia    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   Mallory-Weiss tear    OA (osteoarthritis)    hands   OSA (obstructive sleep apnea)    No longer uses CPAP   Persistent atrial fibrillation with rapid ventricular response ([place]) 02/05/2020   Recurrent syncope    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([place])     Patient Active Problem List   Diagnosis Date Noted   ESRD on dialysis ([place]) 04/13/2021   Cellulitis of right foot 01/17/2021   Normocytic anemia 01/17/2021   Acute lower UTI 01/17/2021    Acute GI bleeding 01/06/2021   Symptomatic anemia 12/02/2020   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   Orthostatic hypotension 11/23/2020   Acute renal insufficiency 10/23/2020   Alcohol abuse 10/23/2020   Hypokalemia 10/23/2020   Hypomagnesemia 10/23/2020   Low back pain at multiple sites 10/23/2020   Malnutrition of mild degree Altamease Oiler: 75% to less than 90% of standard weight) ([place]) 10/23/2020   Open wound of scalp without complication 25/95/6387   Oral candidiasis 10/23/2020   Cellulitis of left foot    Cellulitis of toe of left foot 10/13/2020   Skin ulcer of left great toe ([place]) 10/13/2020   CKD (chronic kidney disease) stage 4, 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/13/2020   Chronic heart failure with preserved ejection fraction (HFpEF) ([place]) 10/13/2020   Transient weakness of left lower extremity 09/07/2020   Heme positive stool 05/14/2020   Abnormality of gait 04/27/2020   Salmonella food poisoning 56/43/3295   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[place]) 03/17/2020   Yeast infection 03/16/2020   Diarrhea 03/16/2020   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 (HCC)    Allergies    Anemia, chronic disease    History of hypertension    Debility    Syncope and collapse 02/22/2020   AKI (acute kidney injury) (  [place]) 02/10/2020   Unspecified protein-calorie malnutrition ([place]) 02/10/2020   Weakness 02/06/2020   History of repair of hip joint 10/15/2019   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   Pain in right foot 06/27/2019   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   Bradycardia 04/20/2019   Chronic diastolic heart failure ([place]) 03/14/2019   Atrial fibrillation with rapid ventricular response ([place])    Gastrointestinal hemorrhage 02/25/2019   Fall at home, initial encounter 02/25/2019   Bilateral lower extremity edema 02/25/2019   Hyponatremia 02/25/2019   Fall 02/25/2019   Benign prostatic hyperplasia with lower urinary tract  symptoms 12/19/2018   Iron deficiency anemia due to chronic blood loss 08/11/2018   Anemia 08/11/2018   Hardening of the aorta (main artery of the heart) ([place]) 08/11/2018   Benign neoplasm of colon 06/18/2018   History of adenomatous polyp of colon 08/10/2017   PAF (paroxysmal atrial fibrillation) ([place]) 02/05/2017   Impaired fasting glucose 06/10/2016   Encounter for general adult medical examination without abnormal findings 06/03/2016   Posterior dislocation of hip, closed ([place]) 04/30/2016   Hip fracture ([place]) 03/30/2016   Hypertension 03/30/2016   Hyperlipidemia 03/30/2016   Osteoarthritis 03/30/2016   Depressive disorder 03/30/2016   Rhabdomyolysis 03/30/2016   Leukocytosis 03/30/2016   Pressure ulcer 03/30/2016   Fracture of hip ([place]) 03/30/2016   Gastroesophageal reflux disease 12/17/2015   Paroxysmal atrial fibrillation ([place]) 12/17/2015   Hypertensive disorder 12/17/2015   Long term (current) use of anticoagulants 12/17/2015   Moderate major depression, single episode ([place]) 12/17/2015   Pure hypercholesterolemia 12/17/2015   Seasonal allergic rhinitis 12/17/2015   CAD (coronary artery disease) 08/21/2015    Past Surgical History:  Procedure Laterality Date   ANKLE SURGERY Left 2014   had rods put in    AV FISTULA PLACEMENT Left 04/21/2021   Procedure: LEFT ARM ARTERIOVENOUS (AV) FISTULA CREATION - Left brachiocephalic fistula;  Surgeon: Marty Heck, MD;  Location: [facility];  Service: Vascular;  Laterality: Left;   BIOPSY  02/28/2019   Procedure: BIOPSY;  Surgeon: Milus Banister, MD;  Location: [place];  Service: Endoscopy;;   CARDIOVERSION  02/06/2020   CARDIOVERSION N/A 02/06/2020   Procedure: CARDIOVERSION;  Surgeon: Werner Lean, MD;  Location: [place] ENDOSCOPY;  Service: Cardiovascular;  Laterality: N/A;   CARDIOVERSION N/A 02/24/2020   Procedure: CARDIOVERSION;  Surgeon: Werner Lean, MD;  Location: [place];  Service:  Cardiovascular;  Laterality: N/A;   COLONOSCOPY     [place]   COLONOSCOPY WITH PROPOFOL N/A 01/08/2021   Procedure: COLONOSCOPY WITH PROPOFOL;  Surgeon: Milus Banister, MD;  Location: [facility] ENDOSCOPY;  Service: Endoscopy;  Laterality: N/A;   ESOPHAGOGASTRODUODENOSCOPY (EGD) WITH PROPOFOL N/A 02/28/2019   Procedure: ESOPHAGOGASTRODUODENOSCOPY (EGD) WITH PROPOFOL;  Surgeon: Milus Banister, MD;  Location: [facility] ENDOSCOPY;  Service: Endoscopy;  Laterality: N/A;   HIP ARTHROPLASTY Left 04/01/2016   Procedure: ARTHROPLASTY BIPOLAR HIP (HEMIARTHROPLASTY);  Surgeon: Paralee Cancel, MD;  Location: [place];  Service: Orthopedics;  Laterality: Left;   HIP CLOSED REDUCTION Left 04/30/2016   Procedure: CLOSED REDUCTION HIP;  Surgeon: Wylene Simmer, MD;  Location: [place];  Service: Orthopedics;  Laterality: Left;   HOT HEMOSTASIS N/A 01/08/2021   Procedure: HOT HEMOSTASIS (ARGON PLASMA COAGULATION/BICAP);  Surgeon: Milus Banister, MD;  Location: [facility] ENDOSCOPY;  Service: Endoscopy;  Laterality: N/A;   IR RADIOLOGIST EVAL & MGMT  12/31/2020   TONSILLECTOMY     TOTAL HIP ARTHROPLASTY Right 10/15/2019   Procedure: TOTAL HIP ARTHROPLASTY  ANTERIOR APPROACH;  Surgeon: Paralee Cancel, MD;  Location: [facility];  Service: Orthopedics;  Laterality: Right;  70 mins       Family History  Problem Relation Age of Onset   Heart disease Mother 33   Hypertension Mother    Arthritis Mother    Heart failure Father 76   Stroke Maternal Aunt    Heart failure Sister    Colon cancer Neg Hx    Esophageal cancer Neg Hx    Stomach cancer Neg Hx    Rectal cancer Neg Hx     Social History   Tobacco Use   Smoking status: Former    Packs/day: 1.00    Years: 20.00    Pack years: 20.00    Types: Cigarettes   Smokeless tobacco: Never   Tobacco comments:       quit 20 years  smoked on and off for 20 years  Vaping Use   Vaping Use: Never used  Substance Use Topics   Alcohol use: Yes    Alcohol/week: 3.0 standard drinks    Types:  3 Glasses of wine per week    Comment: h/o heavy use   Drug use: Never    Home Medications Prior to Admission medications   Medication Sig Start Date End Date Taking? Authorizing Provider  acetaminophen (TYLENOL) 325 MG tablet Take 2 tablets (650 mg total) by mouth every 4 (four) hours as needed for headache or mild pain. 03/10/20  Yes Angiulli, Lavon Paganini, PA-C  amiodarone (PACERONE) 200 MG tablet Take 1 tablet (200 mg total) by mouth daily. 04/10/20  Yes Dunn, Dayna N, PA-C  atorvastatin (LIPITOR) 20 MG tablet Take 1 tablet (20 mg total) by mouth daily. 03/10/20  Yes Angiulli, Lavon Paganini, PA-C  carvedilol (COREG) 6.25 MG tablet Take 1 tablet (6.25 mg total) by mouth 2 (two) times daily. 02/11/21  Yes Sherren Mocha, MD  cetirizine (ZYRTEC) 10 MG tablet Take 10 mg by mouth daily.   Yes [provider]  escitalopram (LEXAPRO) 10 MG tablet Take 1 tablet (10 mg total) by mouth daily. 03/10/20  Yes Angiulli, Lavon Paganini, PA-C  folic acid (FOLVITE) 237 MCG tablet Take 400 mcg by mouth daily.   Yes [provider]  HYDROcodone-acetaminophen (NORCO) 5-325 MG tablet Take 1 tablet by mouth every 6 (six) hours as needed for moderate pain. 04/21/21  Yes Dagoberto Ligas, PA-C  ketoconazole (NIZORAL) 2 % cream Apply 1 application topically daily. To dry scaly skin on feet Patient taking differently: Apply 1 application topically daily as needed for irritation. 07/23/20  Yes McDonald, Stephan Minister, DPM  Magnesium Oxide 400 MG CAPS Take 1 capsule (400 mg total) by mouth daily. 10/17/20  Yes Eugenie Filler, MD  Multiple Vitamins-Minerals (CENTRUM SILVER 50+MEN) TABS Take 1 tablet by mouth daily with breakfast.   Yes [provider]  nitroGLYCERIN (NITROSTAT) 0.4 MG SL tablet Place 1 tablet (0.4 mg total) under the tongue every 5 (five) minutes x 3 doses as needed for chest pain. 07/28/20  Yes Baldwin Jamaica, PA-C  nystatin (MYCOSTATIN/NYSTOP) powder Apply topically 2 (two) times daily. Patient  taking differently: Apply 1 application topically 2 (two) times daily as needed. 01/22/21  Yes Antonieta Pert, MD  pantoprazole (PROTONIX) 40 MG tablet Take 1 tablet (40 mg total) by mouth daily. 03/10/20  Yes Angiulli, Lavon Paganini, PA-C  potassium chloride SA (KLOR-CON) 20 MEQ tablet Take 1 tablet (20 mEq total) by mouth daily. 10/17/20 10/12/21 Yes Eugenie Filler, MD  sodium bicarbonate 650 MG tablet Take 1 tablet (650 mg total) by mouth 2 (two) times daily. 01/22/21  Yes Antonieta Pert, MD  torsemide 40 MG TABS Take 40 mg by mouth daily. 01/22/21  Yes Antonieta Pert, MD  traZODone (DESYREL) 50 MG tablet Take 1 tablet (50 mg total) by mouth at bedtime. 03/10/20  Yes Angiulli, Lavon Paganini, PA-C  ferrous sulfate 325 (65 FE) MG tablet Take 1 tablet (325 mg total) by mouth daily. Patient not taking: Reported on 05/01/2021 12/03/20 02/03/21  Aline August, MD  folic acid (FOLVITE) 1 MG tablet Take 1 tablet (1 mg total) by mouth daily. Patient not taking: Reported on 05/01/2021 03/10/20   Angiulli, Lavon Paganini, PA-C  loratadine (CLARITIN) 10 MG tablet Take 1 tablet (10 mg total) by mouth daily. Patient not taking: Reported on 05/01/2021 02/12/20   Medina-Vargas, Monina C, NP    Allergies    Penicillins  Review of Systems   Review of Systems  Constitutional:  Positive for activity change.  HENT:  Negative for congestion.   Eyes:  Negative for visual disturbance.  Respiratory:  Negative for shortness of breath.   Cardiovascular:  Negative for chest pain.  Musculoskeletal:  Positive for arthralgias and myalgias.  Neurological:  Positive for dizziness.  Hematological:  Does not bruise/bleed easily.  All other systems reviewed and are negative.  Physical Exam Updated Vital Signs BP (!) 149/72    Pulse 71    Temp 98.2 F (36.8 C) (Oral)    Resp 14    Ht 6' (1.829 m)    Wt 97.5 kg    SpO2 94%    BMI 29.16 kg/m   Physical Exam Vitals and nursing note reviewed.  Constitutional:      Appearance: He is well-developed.   HENT:     Head: Atraumatic.  Eyes:     Extraocular Movements: Extraocular movements intact.     Pupils: Pupils are equal, round, and reactive to light.  Neck:     Comments: No midline c-spine tenderness, pt able to turn head to 45 degrees bilaterally without any pain and able to flex neck to the chest and extend without any pain or neurologic symptoms.  Cardiovascular:     Rate and Rhythm: Normal rate.  Pulmonary:     Effort: Pulmonary effort is normal.  Musculoskeletal:     Cervical back: Neck supple.  Skin:    General: Skin is warm.  Neurological:     Mental Status: He is alert and oriented to person, place, and time.     Cranial Nerves: No cranial nerve deficit.     Sensory: No sensory deficit.     Motor: No weakness.     Coordination: Coordination normal.     Gait: Gait abnormal.    ED Results / Procedures / Treatments   Labs (all labs ordered are listed, but only abnormal results are displayed) Labs Reviewed  CBC WITH DIFFERENTIAL/PLATELET - Abnormal; Notable for the following components:      Result Value   WBC 11.6 (*)    RBC 3.34 (*)    Hemoglobin 11.5 (*)    HCT 35.3 (*)    MCV 105.7 (*)    MCH 34.4 (*)    RDW 15.6 (*)    Neutro Abs 9.8 (*)    All other components within normal limits  BASIC METABOLIC PANEL - Abnormal; Notable for the following components:   Sodium 130 (*)    Chloride 92 (*)    Creatinine,  Ser 3.68 (*)    Calcium 8.6 (*)    GFR, Estimated 16 (*)    All other components within normal limits  RESP PANEL BY RT-PCR (FLU A&B, COVID) ARPGX2  ETHANOL    EKG None  Radiology CT Head Wo Contrast  Result Date: 05/01/2021 CLINICAL DATA:  Neck trauma.  Fall. EXAM: CT HEAD WITHOUT CONTRAST CT CERVICAL SPINE WITHOUT CONTRAST TECHNIQUE: Multidetector CT imaging of the head and cervical spine was performed following the standard protocol without intravenous contrast. Multiplanar CT image reconstructions of the cervical spine were also generated.  COMPARISON:  01/16/2021 FINDINGS: CT HEAD FINDINGS Brain: There is no mass, hemorrhage or extra-axial collection. There is generalized atrophy without lobar predilection. There is hypoattenuation of the periventricular white matter, most commonly indicating chronic ischemic microangiopathy. Vascular: Atherosclerotic calcification of the vertebral and internal carotid arteries at the skull base. No abnormal hyperdensity of the major intracranial arteries or dural venous sinuses. Skull: The visualized skull base, calvarium and extracranial soft tissues are normal. Sinuses/Orbits: No fluid levels or advanced mucosal thickening of the visualized paranasal sinuses. No mastoid or middle ear effusion. The orbits are normal. CT CERVICAL SPINE FINDINGS Alignment: No static subluxation. Facets are aligned. Occipital condyles are normally positioned. Skull base and vertebrae: No acute fracture. Soft tissues and spinal canal: No prevertebral fluid or swelling. No visible canal hematoma. Disc levels: There is multilevel facet hypertrophy. There is fusion of the left C3 and C4 facets and the right C4 and C5 facets. Upper chest: No pneumothorax, pulmonary nodule or pleural effusion. Other: Normal visualized paraspinal cervical soft tissues. IMPRESSION: 1. Chronic ischemic microangiopathy and generalized atrophy without acute intracranial abnormality. 2. No acute fracture or static subluxation of the cervical spine. Cerebral Atrophy (ICD10-G31.9). Electronically Signed   By: Ulyses Jarred M.D.   On: 05/01/2021 23:05   CT Cervical Spine Wo Contrast  Result Date: 05/01/2021 CLINICAL DATA:  Neck trauma.  Fall. EXAM: CT HEAD WITHOUT CONTRAST CT CERVICAL SPINE WITHOUT CONTRAST TECHNIQUE: Multidetector CT imaging of the head and cervical spine was performed following the standard protocol without intravenous contrast. Multiplanar CT image reconstructions of the cervical spine were also generated. COMPARISON:  01/16/2021 FINDINGS: CT  HEAD FINDINGS Brain: There is no mass, hemorrhage or extra-axial collection. There is generalized atrophy without lobar predilection. There is hypoattenuation of the periventricular white matter, most commonly indicating chronic ischemic microangiopathy. Vascular: Atherosclerotic calcification of the vertebral and internal carotid arteries at the skull base. No abnormal hyperdensity of the major intracranial arteries or dural venous sinuses. Skull: The visualized skull base, calvarium and extracranial soft tissues are normal. Sinuses/Orbits: No fluid levels or advanced mucosal thickening of the visualized paranasal sinuses. No mastoid or middle ear effusion. The orbits are normal. CT CERVICAL SPINE FINDINGS Alignment: No static subluxation. Facets are aligned. Occipital condyles are normally positioned. Skull base and vertebrae: No acute fracture. Soft tissues and spinal canal: No prevertebral fluid or swelling. No visible canal hematoma. Disc levels: There is multilevel facet hypertrophy. There is fusion of the left C3 and C4 facets and the right C4 and C5 facets. Upper chest: No pneumothorax, pulmonary nodule or pleural effusion. Other: Normal visualized paraspinal cervical soft tissues. IMPRESSION: 1. Chronic ischemic microangiopathy and generalized atrophy without acute intracranial abnormality. 2. No acute fracture or static subluxation of the cervical spine. Cerebral Atrophy (ICD10-G31.9). Electronically Signed   By: Ulyses Jarred M.D.   On: 05/01/2021 23:05    Procedures Procedures   Medications Ordered in ED Medications  sodium chloride 0.9 % bolus 500 mL (500 mLs Intravenous New Bag/Given 05/01/21 2236)    ED Course  I have reviewed the triage vital signs and the nursing notes.  Pertinent labs & imaging results that were available during my care of the patient were reviewed by me and considered in my medical decision making (see chart for details).    MDM Rules/Calculators/A&P                          80 year old comes with a chief complaint of dizziness, unsteady gait.  He has multiple medical comorbidities including CAD, CHF, A. fib, aortic aneurysm, ESRD and alcoholic liver cirrhosis.  That being said, patient resides at independent living facility and typically does not have balance issues.  I tried to ambulate the patient, he was unsteady but was able to recover well.  He did get dizzy when he gets up, suspicion high for orthostatic dizziness.  No new medications.  Patient informs that he has been eating and drinking well  Concerns for possible anemia given that he has history of GI bleed.  Other possibilities include electrolyte abnormality, POTS, stroke, flu/COVID.  11:38 PM Patient ambulated by [place].  Little unsteady with his gait.  Not sure if he is safe to go back to assisted living.  Continues to have orthostasis.  Best is to admit him to the hospital for PT OT evaluation.  MRI brain ordered to rule out stroke. Spoke with Dr. Jonelle Sidle.  Adding ethanol level.    Final Clinical Impression(s) / ED Diagnoses Final diagnoses:  AKI (acute kidney injury) ([place])  Orthostatic dizziness  Ataxia    Rx / DC Orders ED Discharge Orders     None        Varney Biles, MD 05/01/21 [place], Fadel Clason, MD 05/01/21 2338

## 2021-05-01 NOTE — ED Notes (Signed)
Pt sheets and gown changed s/p incontinence and BM

## 2021-05-02 ENCOUNTER — Observation Stay (HOSPITAL_COMMUNITY): Payer: Medicare Other

## 2021-05-02 DIAGNOSIS — Z96643 Presence of artificial hip joint, bilateral: Secondary | ICD-10-CM | POA: Diagnosis not present

## 2021-05-02 DIAGNOSIS — N179 Acute kidney failure, unspecified: Secondary | ICD-10-CM | POA: Diagnosis not present

## 2021-05-02 DIAGNOSIS — I639 Cerebral infarction, unspecified: Secondary | ICD-10-CM | POA: Diagnosis not present

## 2021-05-02 DIAGNOSIS — R42 Dizziness and giddiness: Secondary | ICD-10-CM | POA: Diagnosis not present

## 2021-05-02 DIAGNOSIS — K7030 Alcoholic cirrhosis of liver without ascites: Secondary | ICD-10-CM | POA: Diagnosis not present

## 2021-05-02 DIAGNOSIS — Z79899 Other long term (current) drug therapy: Secondary | ICD-10-CM | POA: Diagnosis not present

## 2021-05-02 DIAGNOSIS — Z20822 Contact with and (suspected) exposure to covid-19: Secondary | ICD-10-CM | POA: Diagnosis not present

## 2021-05-02 DIAGNOSIS — W19XXXA Unspecified fall, initial encounter: Secondary | ICD-10-CM | POA: Diagnosis not present

## 2021-05-02 DIAGNOSIS — R531 Weakness: Secondary | ICD-10-CM | POA: Diagnosis not present

## 2021-05-02 DIAGNOSIS — Z87891 Personal history of nicotine dependence: Secondary | ICD-10-CM | POA: Diagnosis not present

## 2021-05-02 DIAGNOSIS — E7849 Other hyperlipidemia: Secondary | ICD-10-CM | POA: Diagnosis not present

## 2021-05-02 DIAGNOSIS — Z992 Dependence on renal dialysis: Secondary | ICD-10-CM | POA: Diagnosis not present

## 2021-05-02 DIAGNOSIS — I2510 Atherosclerotic heart disease of native coronary artery without angina pectoris: Secondary | ICD-10-CM | POA: Diagnosis not present

## 2021-05-02 DIAGNOSIS — I132 Hypertensive heart and chronic kidney disease with heart failure and with stage 5 chronic kidney disease, or end stage renal disease: Secondary | ICD-10-CM | POA: Diagnosis not present

## 2021-05-02 DIAGNOSIS — R55 Syncope and collapse: Secondary | ICD-10-CM

## 2021-05-02 DIAGNOSIS — R269 Unspecified abnormalities of gait and mobility: Secondary | ICD-10-CM | POA: Diagnosis not present

## 2021-05-02 DIAGNOSIS — D72829 Elevated white blood cell count, unspecified: Secondary | ICD-10-CM | POA: Diagnosis not present

## 2021-05-02 DIAGNOSIS — E871 Hypo-osmolality and hyponatremia: Secondary | ICD-10-CM | POA: Diagnosis not present

## 2021-05-02 DIAGNOSIS — I5032 Chronic diastolic (congestive) heart failure: Secondary | ICD-10-CM | POA: Diagnosis not present

## 2021-05-02 DIAGNOSIS — N186 End stage renal disease: Secondary | ICD-10-CM | POA: Diagnosis not present

## 2021-05-02 LAB — CBC
HCT: 34.6 % — ABNORMAL LOW (ref 39.0–52.0)
Hemoglobin: 11.5 g/dL — ABNORMAL LOW (ref 13.0–17.0)
MCH: 34.4 pg — ABNORMAL HIGH (ref 26.0–34.0)
MCHC: 33.2 g/dL (ref 30.0–36.0)
MCV: 103.6 fL — ABNORMAL HIGH (ref 80.0–100.0)
Platelets: 149 10*3/uL — ABNORMAL LOW (ref 150–400)
RBC: 3.34 MIL/uL — ABNORMAL LOW (ref 4.22–5.81)
RDW: 15.5 % (ref 11.5–15.5)
WBC: 11 10*3/uL — ABNORMAL HIGH (ref 4.0–10.5)
nRBC: 0 % (ref 0.0–0.2)

## 2021-05-02 LAB — COMPREHENSIVE METABOLIC PANEL
ALT: 15 U/L (ref 0–44)
AST: 25 U/L (ref 15–41)
Albumin: 3 g/dL — ABNORMAL LOW (ref 3.5–5.0)
Alkaline Phosphatase: 114 U/L (ref 38–126)
Anion gap: 14 (ref 5–15)
BUN: 27 mg/dL — ABNORMAL HIGH (ref 8–23)
CO2: 23 mmol/L (ref 22–32)
Calcium: 8.7 mg/dL — ABNORMAL LOW (ref 8.9–10.3)
Chloride: 96 mmol/L — ABNORMAL LOW (ref 98–111)
Creatinine, Ser: 4.04 mg/dL — ABNORMAL HIGH (ref 0.61–1.24)
GFR, Estimated: 14 mL/min — ABNORMAL LOW (ref >60)
Glucose, Bld: 103 mg/dL — ABNORMAL HIGH (ref 70–99)
Potassium: 4.3 mmol/L (ref 3.5–5.1)
Sodium: 133 mmol/L — ABNORMAL LOW (ref 135–145)
Total Bilirubin: 1.2 mg/dL (ref 0.3–1.2)
Total Protein: 5.8 g/dL — ABNORMAL LOW (ref 6.5–8.1)

## 2021-05-02 LAB — RESP PANEL BY RT-PCR (FLU A&B, COVID) ARPGX2
Influenza A by PCR: NEGATIVE
Influenza B by PCR: NEGATIVE
SARS Coronavirus 2 by RT PCR: NEGATIVE

## 2021-05-02 LAB — GLUCOSE, CAPILLARY: Glucose-Capillary: 121 mg/dL — ABNORMAL HIGH (ref 70–99)

## 2021-05-02 LAB — ETHANOL: Alcohol, Ethyl (B): <10 mg/dL (ref <10)

## 2021-05-02 MED ORDER — LOPERAMIDE HCL 2 MG PO CAPS
4.0000 mg | ORAL_CAPSULE | Freq: Once | ORAL | Status: AC
  Administered 2021-05-02: 13:00:00 4 mg via ORAL
  Filled 2021-05-02: qty 2

## 2021-05-02 MED ORDER — KETOCONAZOLE 2 % EX CREA
1.0000 "application " | TOPICAL_CREAM | Freq: Every day | CUTANEOUS | Status: DC
  Filled 2021-05-02: qty 15

## 2021-05-02 MED ORDER — NITROGLYCERIN 0.4 MG SL SUBL: 0.4000 mg | SUBLINGUAL_TABLET | SUBLINGUAL | Status: DC | PRN

## 2021-05-02 MED ORDER — HEPARIN SODIUM (PORCINE) 5000 UNIT/ML IJ SOLN
5000.0000 [IU] | Freq: Three times a day (TID) | INTRAMUSCULAR | Status: DC
  Administered 2021-05-02: 06:00:00 5000 [IU] via SUBCUTANEOUS
  Filled 2021-05-02: qty 1

## 2021-05-02 MED ORDER — ONDANSETRON HCL 4 MG PO TABS: 4.0000 mg | ORAL_TABLET | Freq: Four times a day (QID) | ORAL | Status: DC | PRN

## 2021-05-02 MED ORDER — NYSTATIN 100000 UNIT/GM EX POWD: 1.0000 "application " | Freq: Two times a day (BID) | CUTANEOUS | Status: DC | PRN

## 2021-05-02 MED ORDER — FOLIC ACID 1 MG PO TABS
500.0000 ug | ORAL_TABLET | Freq: Every day | ORAL | Status: DC
  Administered 2021-05-02: 09:00:00 0.5 mg via ORAL
  Filled 2021-05-02: qty 1

## 2021-05-02 MED ORDER — ADULT MULTIVITAMIN W/MINERALS CH
1.0000 | ORAL_TABLET | Freq: Every day | ORAL | Status: DC
  Administered 2021-05-02: 09:00:00 1 via ORAL
  Filled 2021-05-02: qty 1

## 2021-05-02 MED ORDER — POTASSIUM CHLORIDE CRYS ER 20 MEQ PO TBCR
20.0000 meq | EXTENDED_RELEASE_TABLET | Freq: Every day | ORAL | Status: DC
  Administered 2021-05-02: 09:00:00 20 meq via ORAL
  Filled 2021-05-02: qty 1

## 2021-05-02 MED ORDER — HYDROCODONE-ACETAMINOPHEN 5-325 MG PO TABS: 1.0000 | ORAL_TABLET | Freq: Four times a day (QID) | ORAL | Status: DC | PRN

## 2021-05-02 MED ORDER — ESCITALOPRAM OXALATE 10 MG PO TABS
10.0000 mg | ORAL_TABLET | Freq: Every day | ORAL | Status: DC
  Administered 2021-05-02: 09:00:00 10 mg via ORAL
  Filled 2021-05-02: qty 1

## 2021-05-02 MED ORDER — SODIUM CHLORIDE 0.9% FLUSH
3.0000 mL | Freq: Two times a day (BID) | INTRAVENOUS | Status: DC
  Administered 2021-05-02: 09:00:00 3 mL via INTRAVENOUS

## 2021-05-02 MED ORDER — AMIODARONE HCL 200 MG PO TABS
200.0000 mg | ORAL_TABLET | Freq: Every day | ORAL | Status: DC
  Administered 2021-05-02: 09:00:00 200 mg via ORAL
  Filled 2021-05-02: qty 1

## 2021-05-02 MED ORDER — TORSEMIDE 20 MG PO TABS
40.0000 mg | ORAL_TABLET | Freq: Every day | ORAL | Status: DC
  Administered 2021-05-02: 09:00:00 40 mg via ORAL
  Filled 2021-05-02: qty 2

## 2021-05-02 MED ORDER — LORATADINE 10 MG PO TABS
10.0000 mg | ORAL_TABLET | Freq: Every day | ORAL | Status: DC
  Administered 2021-05-02: 09:00:00 10 mg via ORAL
  Filled 2021-05-02: qty 1

## 2021-05-02 MED ORDER — ONDANSETRON HCL 4 MG/2ML IJ SOLN: 4.0000 mg | Freq: Four times a day (QID) | INTRAMUSCULAR | Status: DC | PRN

## 2021-05-02 MED ORDER — PANTOPRAZOLE SODIUM 40 MG PO TBEC
40.0000 mg | DELAYED_RELEASE_TABLET | Freq: Every day | ORAL | Status: DC
  Administered 2021-05-02: 09:00:00 40 mg via ORAL
  Filled 2021-05-02: qty 1

## 2021-05-02 MED ORDER — TRAZODONE HCL 50 MG PO TABS: 50.0000 mg | ORAL_TABLET | Freq: Every day | ORAL | Status: DC

## 2021-05-02 MED ORDER — SODIUM BICARBONATE 650 MG PO TABS
650.0000 mg | ORAL_TABLET | Freq: Two times a day (BID) | ORAL | Status: DC
  Administered 2021-05-02: 09:00:00 650 mg via ORAL
  Filled 2021-05-02: qty 1

## 2021-05-02 MED ORDER — MAGNESIUM OXIDE -MG SUPPLEMENT 400 (240 MG) MG PO TABS
400.0000 mg | ORAL_TABLET | Freq: Every day | ORAL | Status: DC
  Administered 2021-05-02: 09:00:00 400 mg via ORAL
  Filled 2021-05-02: qty 1

## 2021-05-02 MED ORDER — ATORVASTATIN CALCIUM 10 MG PO TABS
20.0000 mg | ORAL_TABLET | Freq: Every day | ORAL | Status: DC
  Administered 2021-05-02: 09:00:00 20 mg via ORAL
  Filled 2021-05-02: qty 2

## 2021-05-02 MED ORDER — ACETAMINOPHEN 325 MG PO TABS: 650.0000 mg | ORAL_TABLET | ORAL | Status: DC | PRN

## 2021-05-02 MED ORDER — CARVEDILOL 6.25 MG PO TABS
6.2500 mg | ORAL_TABLET | Freq: Two times a day (BID) | ORAL | Status: DC
  Administered 2021-05-02: 09:00:00 6.25 mg via ORAL
  Filled 2021-05-02: qty 1

## 2021-05-02 NOTE — Care Management Note (Signed)
Case Management Note  Patient Details  Name: Darryl Diaz MRN: [id number] Date of Birth: November 04, 1940  Subjective/Objective:  80 y.o. male admitted with multiple falls, dizziness; suspect orthostatic hypotension. Brain MRI negative for acute abnormality; moderate chronic small vessel ischemic changes. PMH includes ESRD (HD MWF), afib, GIB, HTN, HLD, OSA, depression, alcoholic cirrhosis, R THA.   Met with pt. He resides at [facility] independent living. [facility] provides all his meals and transportation. He uses a rollator. He reports that he has a sister who is very supportive. Discussed PT recommendations for [facility] PT. Pt agrees with HHPT. Discussed preference for a [facility] agency. He reports that he used [facility] in the past and he liked them. Contacted Georgina Snell with [facility] and he accepted the referral.            Action/Plan: Received referral to assist with [facility] PT/OT.   Expected Discharge Date:  05/02/21               Expected Discharge Plan:  [place]  In-House Referral:     Discharge planning Services  CM Consult  Post Acute Care Choice:  Home Health Choice offered to:  Patient  DME Arranged:    DME Agency:     HH Arranged:  PT HH Agency:  [place]  Status of Service:     If discussed at [place] of Stay Meetings, dates discussed:    Additional Comments:  Norina Buzzard, RN 05/02/2021, 10:42 AM

## 2021-05-02 NOTE — Progress Notes (Signed)
Per RN, [facility] needs to be called to pick up the pt. Darryl Diaz, SW contacted the facility, but they did not answer. Contacted pt's sister [name]) and she will pick him up around 1 pm and RN made aware.

## 2021-05-02 NOTE — Evaluation (Signed)
Physical Therapy Evaluation Patient Details Name: Darryl Diaz MRN: [id number] DOB: 1941/04/19 Today's Date: 05/02/2021  History of Present Illness  Pt is a 80 y.o. male admitted from [place] on 05/01/21 with multiple falls, dizziness; suspect orthostatic hypotension. Brain MRI negative for acute abnormality; moderate chronic small vessel ischemic changes. 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   Clinical Impression  Pt presents with an overall decrease in functional mobility secondary to above. PTA, pt resident at [facility], lives alone, ambulatory with rollator; meals provided. Today, pt moving fairly well with min guard to minA for stability; pt denies dizziness with activity. Pt would benefit from continued acute PT services to maximize functional mobility and independence prior to return to [facility] with HHPT services.     Orthostatic BPs Supine 106/44  Sitting 110/60  Standing 105/52  Standing after 2 min 114/57  Post-ambulation 108/73   SpO2 100% on RA, HR 60s-70s     Recommendations for follow up therapy are one component of a multi-disciplinary discharge planning process, led by the attending physician.  Recommendations may be updated based on patient status, additional functional criteria and insurance authorization.  Follow Up Recommendations Home health PT    Assistance Recommended at Discharge Intermittent Supervision/Assistance  Functional Status Assessment Patient has had a recent decline in their functional status and demonstrates the ability to make significant improvements in function in a reasonable and predictable amount of time.  Equipment Recommendations  None recommended by PT    Recommendations for Other Services       Precautions / Restrictions Precautions Precautions: Fall;Other (comment) Precaution Comments: negative orthostatic hypotension, asymptomatic during session Restrictions Weight  Bearing Restrictions: No      Mobility  Bed Mobility Overal bed mobility: Independent                  Transfers Overall transfer level: Needs assistance Equipment used: Rolling walker (2 wheels) Transfers: Sit to/from Stand Sit to Stand: Min assist           General transfer comment: MinA to stabilize walker for pt to stand, no assist needed for trunk elevation    Ambulation/Gait Ambulation/Gait assistance: Min guard Gait Distance (Feet): 124 Feet Assistive device: Rolling walker (2 wheels) Gait Pattern/deviations: Step-through pattern;Decreased stride length;Trunk flexed Gait velocity: Decreased     General Gait Details: Slow, mostly steady gait, although shakiness noted, with RW and intermittent min guard for balance; pt denies dizziness  Stairs            Wheelchair Mobility    Modified Rankin (Stroke Patients Only)       Balance Overall balance assessment: Needs assistance   Sitting balance-Leahy Scale: Good       Standing balance-Leahy Scale: Poor Standing balance comment: Reliant on at least single UE support                             Pertinent Vitals/Pain Pain Assessment: No/denies pain    Home Living Family/patient expects to be discharged to:: Assisted living                 Home Equipment: Conservation officer, nature (2 wheels);Rollator (4 wheels);Wheelchair - manual;Shower seat - built in;Adaptive equipment;Hand held shower head;Shower seat      Prior Function Prior Level of Function : Independent/Modified Independent;History of Falls (last six months)  Mobility Comments: Reports mod indep ambulating with rollator (prefers it to RW due to seat); will sometimes walk around apt without DME "when I can reach everything." Endorses multiple falls due to feeling weak/dizzy ADLs Comments: Performs sponge bathing sitting at sink. Meals provided by facility     Hand Dominance        Extremity/Trunk  Assessment   Upper Extremity Assessment Upper Extremity Assessment: Generalized weakness    Lower Extremity Assessment Lower Extremity Assessment: Generalized weakness    Cervical / Trunk Assessment Cervical / Trunk Assessment: Kyphotic  Communication   Communication: No difficulties  Cognition Arousal/Alertness: Awake/alert Behavior During Therapy: WFL for tasks assessed/performed Overall Cognitive Status: Within Functional Limits for tasks assessed                                 General Comments: WFL for simple tasks; some decreased attention noted, decreased insight into deficits, suspect at baseline cognition        General Comments General comments (skin integrity, edema, etc.): Discussed recommendation for use of RW for added stability, but pt adamant about preference for rollator although he reports, "I feel like it gets away from me." Pt values his independence    Exercises     Assessment/Plan    PT Assessment Patient needs continued PT services  PT Problem List Decreased strength;Decreased activity tolerance;Decreased balance;Decreased mobility;Decreased knowledge of use of DME;Decreased safety awareness       PT Treatment Interventions DME instruction;Gait training;Functional mobility training;Therapeutic activities;Therapeutic exercise;Balance training;Patient/family education    PT Goals (Current goals can be found in the Care Plan section)  Acute Rehab PT Goals Patient Stated Goal: Return home PT Goal Formulation: With patient Time For Goal Achievement: 05/16/21 Potential to Achieve Goals: Good    Frequency Min 3X/week   Barriers to discharge        Co-evaluation               AM-PAC PT "6 Clicks" Mobility  Outcome Measure Help needed turning from your back to your side while in a flat bed without using bedrails?: None Help needed moving from lying on your back to sitting on the side of a flat bed without using bedrails?:  None Help needed moving to and from a bed to a chair (including a wheelchair)?: A Little Help needed standing up from a chair using your arms (e.g., wheelchair or bedside chair)?: A Little Help needed to walk in hospital room?: A Little Help needed climbing 3-5 steps with a railing? : A Little 6 Click Score: 20    End of Session Equipment Utilized During Treatment: Gait belt Activity Tolerance: Patient tolerated treatment well Patient left: in chair;with call bell/phone within reach;with chair alarm set Nurse Communication: Mobility status PT Visit Diagnosis: Other abnormalities of gait and mobility (R26.89);Muscle weakness (generalized) (M62.81);History of falling (Z91.81)    Time: 9381-8299 PT Time Calculation (min) (ACUTE ONLY): 21 min   Charges:   PT Evaluation $PT Eval Moderate Complexity: [place], PT, DPT Acute Rehabilitation Services  Pager [phone] Office [place] 05/02/2021, 9:28 AM

## 2021-05-02 NOTE — Discharge Summary (Signed)
Physician Discharge Summary  DARREK LEASURE EYC:[id number] DOB: Sep 08, 1940 DOA: 05/01/2021  PCP: [name], MD  Admit date: 05/01/2021 Discharge date: 05/02/2021  Admitted From: [facility] Disposition: [place]   Recommendations for Outpatient Follow-up:  Follow up with PCP in 1-2 weeks  Home Health: PT Equipment/Devices: None new (has DME) Discharge Condition: Stable CODE STATUS: DNR Diet recommendation: Heart healthy, renal.  Brief/Interim Summary: Darryl Diaz is a 80 y.o. male with a history of ESRD (MWF HD), remote alcohol abuse and resultant alcoholic cirrhosis, atrial fibrillation, HTN, HLD, cardiac arrest, OSA, MDD, and history of GI bleeding who presented to the ED 12/24 with feeling off balance and occasionally falling more frequently. Workup in the ED  yielded stable vital signs, though had orthostatic symptoms when walk tested and felt off balance . WBC 11.6k, hgb 11.5g/dl. CT head and cervical spine demonstrated no acute abnormalities, CXR negative, influenza and covid-19 PCR negative. Subsequent MR brain also did not show any acute abnormalities. After repeat ambulation in the ED, he was a bit unsteady and was observed overnight for repeat orthostatic check and PT evaluation. PT feels he requires minimum assistance and is near his baseline, that he requires only intermittent supervision/assistance, and can be safely discharged back to ILF with addition of home health services. There is no orthostatic hypotension nor orthostatic symptoms on day of discharge. ECG demonstrated nonspecific IVCD which is very similar to prior in September 2022, at which time echocardiogram was performed showing LVEF 60-65%, mild AS, mild LVH. Since no new ECG changes were noted, no significant dysrhythmias on cardiac monitoring were noted, no anginal or other cardiac symptoms reported, repeat echocardiogram was not felt to be necessary.   Discharge Diagnoses:  Principal  Problem:   Fall Active Problems:   Hyperlipidemia   Hyponatremia   Weakness   Syncope and collapse   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HCC)   Abnormality of gait   Alcohol abuse   Benign prostatic hyperplasia with lower urinary tract symptoms   CAD (coronary artery disease)   ESRD on dialysis (HCC)  Leukocytosis: Mild, trending downward without antimicrobial. No fever. Denies new urinary symptoms, abd pain, or wounds. Note Right foot cellulitis on admission in September does not appear to have this at this time. Legs have pale erythema dependently that is chronic per pt, nontender. HD cath without evidence of infection. No pulmonary symptoms, flu and covid PCR negative. . No acute symptoms of VTE.  - Monitor CBC at follow up. No antibiotics currently indicated.   Other chronic medical conditions were stable and treated with home medications.  Discharge Instructions Discharge Instructions     Diet - low sodium heart healthy   Complete by: As directed    Discharge instructions   Complete by: As directed    You were evaluated for falling/gait instability and have had a negative work up which included CT head, CT cervical spine, MRI brain, continuous cardiac monitoring, ECG. You have maintained stable vital signs and appear to be stable to return to ILF portion of [facility] based on physical therapy assessment this morning. Continue your medications as you were, continue dialysis as scheduled tomorrow. If you experience an episode of passing out or nearly passing out, palpitations, chest pain, shortness of breath seek medical attention right away. Otherwise, follow up with your PCP in the next 2 weeks.   Increase activity slowly   Complete by: As directed       Allergies as of 05/02/2021  Reactions   Penicillins Rash        Medication List     STOP taking these medications    loratadine 10 MG tablet Commonly known as: CLARITIN       TAKE these  medications    acetaminophen 325 MG tablet Commonly known as: TYLENOL Take 2 tablets (650 mg total) by mouth every 4 (four) hours as needed for headache or mild pain.   amiodarone 200 MG tablet Commonly known as: PACERONE Take 1 tablet (200 mg total) by mouth daily.   atorvastatin 20 MG tablet Commonly known as: LIPITOR Take 1 tablet (20 mg total) by mouth daily.   carvedilol 6.25 MG tablet Commonly known as: COREG Take 1 tablet (6.25 mg total) by mouth 2 (two) times daily.   Centrum Silver 50+Men Tabs Take 1 tablet by mouth daily with breakfast.   cetirizine 10 MG tablet Commonly known as: ZYRTEC Take 10 mg by mouth daily.   escitalopram 10 MG tablet Commonly known as: LEXAPRO Take 1 tablet (10 mg total) by mouth daily.   ferrous sulfate 325 (65 FE) MG tablet Take 1 tablet (325 mg total) by mouth daily.   folic acid 109 MCG tablet Commonly known as: FOLVITE Take 400 mcg by mouth daily. What changed: Another medication with the same name was removed. Continue taking this medication, and follow the directions you see here.   HYDROcodone-acetaminophen 5-325 MG tablet Commonly known as: Norco Take 1 tablet by mouth every 6 (six) hours as needed for moderate pain.   ketoconazole 2 % cream Commonly known as: NIZORAL Apply 1 application topically daily. To dry scaly skin on feet What changed:  when to take this reasons to take this additional instructions   Magnesium Oxide 400 MG Caps Take 1 capsule (400 mg total) by mouth daily.   nitroGLYCERIN 0.4 MG SL tablet Commonly known as: NITROSTAT Place 1 tablet (0.4 mg total) under the tongue every 5 (five) minutes x 3 doses as needed for chest pain.   nystatin powder Commonly known as: MYCOSTATIN/NYSTOP Apply topically 2 (two) times daily. What changed:  how much to take when to take this reasons to take this   pantoprazole 40 MG tablet Commonly known as: PROTONIX Take 1 tablet (40 mg total) by mouth daily.    potassium chloride SA 20 MEQ tablet Commonly known as: KLOR-CON M Take 1 tablet (20 mEq total) by mouth daily.   sodium bicarbonate 650 MG tablet Take 1 tablet (650 mg total) by mouth 2 (two) times daily.   Torsemide 40 MG Tabs Take 40 mg by mouth daily.   traZODone 50 MG tablet Commonly known as: DESYREL Take 1 tablet (50 mg total) by mouth at bedtime.        Follow-up Information     [facility] Follow up.   Specialty: Home Health Services Why: [facility] with provide home health physical and occupational therapy. Someone from the home health agency will be contacting you to arrange your first appointment. Contact information: [address] [place] [address] [phone]         Tisovec, [name], MD. Schedule an appointment as soon as possible for a visit in 2 day(s).   Specialty: Internal Medicine Contact information: [place] [address] [phone]                Allergies  Allergen Reactions   Penicillins Rash    Consultations: None  Procedures/Studies: CT Head  Wo Contrast  Result Date: 05/01/2021 CLINICAL DATA:  Neck trauma.  Fall. EXAM: CT HEAD WITHOUT CONTRAST CT CERVICAL SPINE WITHOUT CONTRAST TECHNIQUE: Multidetector CT imaging of the head and cervical spine was performed following the standard protocol without intravenous contrast. Multiplanar CT image reconstructions of the cervical spine were also generated. COMPARISON:  01/16/2021 FINDINGS: CT HEAD FINDINGS Brain: There is no mass, hemorrhage or extra-axial collection. There is generalized atrophy without lobar predilection. There is hypoattenuation of the periventricular white matter, most commonly indicating chronic ischemic microangiopathy. Vascular: Atherosclerotic calcification of the vertebral and internal carotid arteries at the skull base. No abnormal hyperdensity of the major intracranial arteries or dural venous sinuses.  Skull: The visualized skull base, calvarium and extracranial soft tissues are normal. Sinuses/Orbits: No fluid levels or advanced mucosal thickening of the visualized paranasal sinuses. No mastoid or middle ear effusion. The orbits are normal. CT CERVICAL SPINE FINDINGS Alignment: No static subluxation. Facets are aligned. Occipital condyles are normally positioned. Skull base and vertebrae: No acute fracture. Soft tissues and spinal canal: No prevertebral fluid or swelling. No visible canal hematoma. Disc levels: There is multilevel facet hypertrophy. There is fusion of the left C3 and C4 facets and the right C4 and C5 facets. Upper chest: No pneumothorax, pulmonary nodule or pleural effusion. Other: Normal visualized paraspinal cervical soft tissues. IMPRESSION: 1. Chronic ischemic microangiopathy and generalized atrophy without acute intracranial abnormality. 2. No acute fracture or static subluxation of the cervical spine. Cerebral Atrophy (ICD10-G31.9). Electronically Signed   By: Ulyses Jarred M.D.   On: 05/01/2021 23:05   CT Cervical Spine Wo Contrast  Result Date: 05/01/2021 CLINICAL DATA:  Neck trauma.  Fall. EXAM: CT HEAD WITHOUT CONTRAST CT CERVICAL SPINE WITHOUT CONTRAST TECHNIQUE: Multidetector CT imaging of the head and cervical spine was performed following the standard protocol without intravenous contrast. Multiplanar CT image reconstructions of the cervical spine were also generated. COMPARISON:  01/16/2021 FINDINGS: CT HEAD FINDINGS Brain: There is no mass, hemorrhage or extra-axial collection. There is generalized atrophy without lobar predilection. There is hypoattenuation of the periventricular white matter, most commonly indicating chronic ischemic microangiopathy. Vascular: Atherosclerotic calcification of the vertebral and internal carotid arteries at the skull base. No abnormal hyperdensity of the major intracranial arteries or dural venous sinuses. Skull: The visualized skull base,  calvarium and extracranial soft tissues are normal. Sinuses/Orbits: No fluid levels or advanced mucosal thickening of the visualized paranasal sinuses. No mastoid or middle ear effusion. The orbits are normal. CT CERVICAL SPINE FINDINGS Alignment: No static subluxation. Facets are aligned. Occipital condyles are normally positioned. Skull base and vertebrae: No acute fracture. Soft tissues and spinal canal: No prevertebral fluid or swelling. No visible canal hematoma. Disc levels: There is multilevel facet hypertrophy. There is fusion of the left C3 and C4 facets and the right C4 and C5 facets. Upper chest: No pneumothorax, pulmonary nodule or pleural effusion. Other: Normal visualized paraspinal cervical soft tissues. IMPRESSION: 1. Chronic ischemic microangiopathy and generalized atrophy without acute intracranial abnormality. 2. No acute fracture or static subluxation of the cervical spine. Cerebral Atrophy (ICD10-G31.9). Electronically Signed   By: Ulyses Jarred M.D.   On: 05/01/2021 23:05   MR BRAIN WO CONTRAST  Result Date: 05/02/2021 CLINICAL DATA:  Stroke EXAM: MRI HEAD WITHOUT CONTRAST TECHNIQUE: Multiplanar, multiecho pulse sequences of the brain and surrounding structures were obtained without intravenous contrast. COMPARISON:  09/07/2020 FINDINGS: Brain: No acute infarct, mass effect or extra-axial collection. No acute or chronic hemorrhage. Hyperintense T2-weighted signal is  moderately widespread throughout the white matter. Generalized volume loss without a clear lobar predilection. The midline structures are normal. Vascular: Major flow voids are preserved. Skull and upper cervical spine: Normal calvarium and skull base. Visualized upper cervical spine and soft tissues are normal. Sinuses/Orbits:No paranasal sinus fluid levels or advanced mucosal thickening. No mastoid or middle ear effusion. Normal orbits. IMPRESSION: 1. No acute intracranial abnormality. 2. Moderate chronic small vessel  ischemic changes and generalized volume loss. Electronically Signed   By: Ulyses Jarred M.D.   On: 05/02/2021 03:57   VAS [place] UPPER EXTREMITY ARTERIAL DUPLEX  Result Date: 04/13/2021  UPPER EXTREMITY DUPLEX STUDY Patient Name:  AUNDRE HIETALA  Date of Exam:   04/13/2021 Medical Rec #: [id number]         Accession #:    [id number] Date of Birth: Sep 28, 1940         Patient Gender: M Patient Age:   40 years Exam Location:  [facility] Procedure:      VAS [place] UPPER EXTREMITY ARTERIAL DUPLEX Referring Phys: Monica Martinez --------------------------------------------------------------------------------  Indications: Pre op HD access.  Performing Technologist: Ralene Cork RVT  Examination Guidelines: A complete evaluation includes B-mode imaging, spectral Doppler, color Doppler, and power Doppler as needed of all accessible portions of each vessel. Bilateral testing is considered an integral part of a complete examination. Limited examinations for reoccurring indications may be performed as noted.  Right Pre-Dialysis Findings: +-----------------------+----------+--------------------+---------+--------+  Location                PSV (cm/s) Intralum. Diam. (cm) Waveform  Comments  +-----------------------+----------+--------------------+---------+--------+  Brachial Antecub. fossa 73         0.62                 triphasic           +-----------------------+----------+--------------------+---------+--------+  Radial Art at Wrist     110        0.21                 triphasic           +-----------------------+----------+--------------------+---------+--------+  Ulnar Art at Wrist      98         0.22                 triphasic           +-----------------------+----------+--------------------+---------+--------+  Left Pre-Dialysis Findings: +-----------------------+----------+--------------------+---------+--------+  Location                PSV (cm/s) Intralum. Diam. (cm) Waveform  Comments   +-----------------------+----------+--------------------+---------+--------+  Brachial Antecub. fossa 107        0.56                 triphasic           +-----------------------+----------+--------------------+---------+--------+  Radial Art at Wrist     86         0.22                 triphasic           +-----------------------+----------+--------------------+---------+--------+  Ulnar Art at Wrist      78         0.16                 triphasic           +-----------------------+----------+--------------------+---------+--------+  Summary:  Right: Patent brachial, radial, and ulnar arteries. Left: Patent brachial, radial, and ulnar arteries. *See table(s)  above for measurements and observations. Electronically signed by Monica Martinez MD on 04/13/2021 at 11:31:22 AM.    Final    VAS [place] UPPER EXTREMITY VEIN MAPPING  Result Date: 04/13/2021 UPPER EXTREMITY VEIN MAPPING Patient Name:  GEORGIE HAQUE  Date of Exam:   04/13/2021 Medical Rec #: [id number]         Accession #:    [id number] Date of Birth: 06/10/1940         Patient Gender: M Patient Age:   75 years Exam Location:  [facility] Procedure:      VAS [place] UPPER EXT VEIN MAPPING (PRE-OP AVF) Referring Phys: Monica Martinez --------------------------------------------------------------------------------  Indications: Pre-access. Performing Technologist: Ralene Cork RVT  Examination Guidelines: A complete evaluation includes B-mode imaging, spectral Doppler, color Doppler, and power Doppler as needed of all accessible portions of each vessel. Bilateral testing is considered an integral part of a complete examination. Limited examinations for reoccurring indications may be performed as noted. +-----------------+-------------+----------+--------+  Right Cephalic    Diameter (cm) Depth (cm) Findings  +-----------------+-------------+----------+--------+  Shoulder              0.36                            +-----------------+-------------+----------+--------+  Prox upper arm        0.33                           +-----------------+-------------+----------+--------+  Mid upper arm         0.28                           +-----------------+-------------+----------+--------+  Dist upper arm        0.27                           +-----------------+-------------+----------+--------+  Antecubital fossa     0.47                           +-----------------+-------------+----------+--------+  Prox forearm          0.21                           +-----------------+-------------+----------+--------+  Mid forearm           0.18                           +-----------------+-------------+----------+--------+  Dist forearm          0.14                           +-----------------+-------------+----------+--------+ +-----------------+-------------+----------+--------+  Right Basilic     Diameter (cm) Depth (cm) Findings  +-----------------+-------------+----------+--------+  Mid upper arm         0.43                           +-----------------+-------------+----------+--------+  Dist upper arm        0.37                           +-----------------+-------------+----------+--------+  Antecubital fossa  0.71                           +-----------------+-------------+----------+--------+ +-----------------+-------------+----------+--------+  Left Cephalic     Diameter (cm) Depth (cm) Findings  +-----------------+-------------+----------+--------+  Shoulder              0.27                           +-----------------+-------------+----------+--------+  Prox upper arm        0.24                           +-----------------+-------------+----------+--------+  Mid upper arm         0.27                           +-----------------+-------------+----------+--------+  Dist upper arm        0.25                           +-----------------+-------------+----------+--------+  Antecubital fossa     0.37                            +-----------------+-------------+----------+--------+  Prox forearm          0.16                           +-----------------+-------------+----------+--------+  Mid forearm           0.17                           +-----------------+-------------+----------+--------+  Dist forearm          0.20                           +-----------------+-------------+----------+--------+ +-----------------+-------------+----------+--------+  Left Basilic      Diameter (cm) Depth (cm) Findings  +-----------------+-------------+----------+--------+  Mid upper arm         0.52                           +-----------------+-------------+----------+--------+  Dist upper arm        0.40                           +-----------------+-------------+----------+--------+  Antecubital fossa     0.39                           +-----------------+-------------+----------+--------+ Summary: Right: Patent cephalic and basilic veins. Left: Patent cephalic and basilic veins. *See table(s) above for measurements and observations.  Diagnosing physician: Monica Martinez MD Electronically signed by Monica Martinez MD on 04/13/2021 at 11:31:47 AM.    Final      Subjective: Feels well, eating full breakfast. Denies fever or chills. No new leg swelling or pain, no shortness of breath or chest pain. No bleeding/bruising. Wants to go home today after feeling stronger working with PT this morning.  Discharge Exam: Vitals:   05/02/21 0853 05/02/21 1122  BP: (!) 115/50 (!) 137/56  Pulse: 69 62  Resp: 17 18  Temp: 97.7 F (36.5 C)   SpO2: 95% 97%   General: Elderly, alert, pleasant male in no distress Cardiovascular: RRR, S1/S2 +, no rubs, no gallops Respiratory: CTA bilaterally, no wheezing, no rhonchi Abdominal: Soft, NT, ND, bowel sounds + Extremities: No deformities or cyanosis Skin: Pale erythema scattered in lower legs without extension to feet. mild edema. Not tender, no induration or discharge. No open wounds. 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 No other CN deficits. No peripheral weakness.  Labs: BNP (last 3 results) Recent Labs    01/09/21 0055 01/16/21 1639 01/19/21 0041  BNP 317.4* 351.8* 573.2*   Basic Metabolic Panel: Recent Labs  Lab 05/01/21 2155 05/02/21 0424  NA 130* 133*  K 4.8 4.3  CL 92* 96*  CO2 26 23  GLUCOSE 84 103*  BUN 23 27*  CREATININE 3.68* 4.04*  CALCIUM 8.6* 8.7*   Liver Function Tests: Recent Labs  Lab 05/02/21 0424  AST 25  ALT 15  ALKPHOS 114  BILITOT 1.2  PROT 5.8*  ALBUMIN 3.0*   No results for input(s): LIPASE, AMYLASE in the last 168 hours. No results for input(s): AMMONIA in the last 168 hours. CBC: Recent Labs  Lab 05/01/21 2155 05/02/21 0424  WBC 11.6* 11.0*  NEUTROABS 9.8*  --   HGB 11.5* 11.5*  HCT 35.3* 34.6*  MCV 105.7* 103.6*  PLT 161 149*   Cardiac Enzymes: No results for input(s): CKTOTAL, CKMB, CKMBINDEX, TROPONINI in the last 168 hours. BNP: Invalid input(s): POCBNP CBG: Recent Labs  Lab 05/02/21 0730  GLUCAP 121*   D-Dimer No results for input(s): DDIMER in the last 72 hours. Hgb A1c No results for input(s): HGBA1C in the last 72 hours. Lipid Profile No results for input(s): CHOL, HDL, LDLCALC, TRIG, CHOLHDL, LDLDIRECT in the last 72 hours. Thyroid function studies No results for input(s): TSH, T4TOTAL, T3FREE, THYROIDAB in the last 72 hours.  Invalid input(s): FREET3 Anemia work up No results for input(s): VITAMINB12, FOLATE, FERRITIN, TIBC, IRON, RETICCTPCT in the last 72 hours. Urinalysis    Component Value Date/Time   COLORURINE AMBER (A) 01/17/2021 0425   APPEARANCEUR CLOUDY (A) 01/17/2021 0425   LABSPEC 1.010 01/17/2021 0425   PHURINE 7.0 01/17/2021 0425   GLUCOSEU NEGATIVE 01/17/2021 0425   HGBUR LARGE (A) 01/17/2021 0425   BILIRUBINUR MODERATE (A) 01/17/2021  0425   KETONESUR NEGATIVE 01/17/2021 0425   PROTEINUR >300 (A) 01/17/2021 0425   NITRITE POSITIVE (A) 01/17/2021 0425   LEUKOCYTESUR LARGE (A) 01/17/2021 0425    Microbiology Recent Results (from the past 240 hour(s))  Resp Panel by RT-PCR (Flu A&B, Covid) Nasopharyngeal Swab     Status: None   Collection Time: 05/01/21 10:47 PM   Specimen: Nasopharyngeal Swab; Nasopharyngeal(NP) swabs in vial transport medium  Result Value Ref Range Status   SARS Coronavirus 2 by RT PCR NEGATIVE NEGATIVE Final    Comment: (NOTE) SARS-CoV-2 target nucleic acids are NOT DETECTED.  The SARS-CoV-2 RNA is generally detectable in upper respiratory specimens during the acute phase of infection. The lowest concentration of SARS-CoV-2 viral copies this assay can detect is 138 copies/mL. A negative result does not preclude SARS-Cov-2 infection and should not be used as the sole basis for treatment or other patient management decisions. A negative result may occur with  improper specimen collection/handling, submission of specimen other than nasopharyngeal swab,  presence of viral mutation(s) within the areas targeted by this assay, and inadequate number of viral copies(<138 copies/mL). A negative result must be combined with clinical observations, patient history, and epidemiological information. The expected result is Negative.  Fact Sheet for Patients:  EntrepreneurPulse.com.au  Fact Sheet for Healthcare Providers:  IncredibleEmployment.be  This test is no t yet approved or cleared by the [place] FDA and  has been authorized for detection and/or diagnosis of SARS-CoV-2 by FDA under an Emergency Use Authorization (EUA). This EUA will remain  in effect (meaning this test can be used) for the duration of the COVID-19 declaration under Section 564(b)(1) of the Act, 21 U.S.C.section 360bbb-3(b)(1), unless the authorization is terminated  or revoked sooner.        Influenza A by PCR NEGATIVE NEGATIVE Final   Influenza B by PCR NEGATIVE NEGATIVE Final    Comment: (NOTE) The Xpert Xpress SARS-CoV-2/FLU/RSV plus assay is intended as an aid in the diagnosis of influenza from Nasopharyngeal swab specimens and should not be used as a sole basis for treatment. Nasal washings and aspirates are unacceptable for Xpert Xpress SARS-CoV-2/FLU/RSV testing.  Fact Sheet for Patients: EntrepreneurPulse.com.au  Fact Sheet for Healthcare Providers: IncredibleEmployment.be  This test is not yet approved or cleared by the [place] FDA and has been authorized for detection and/or diagnosis of SARS-CoV-2 by FDA under an Emergency Use Authorization (EUA). This EUA will remain in effect (meaning this test can be used) for the duration of the COVID-19 declaration under Section 564(b)(1) of the Act, 21 U.S.C. section 360bbb-3(b)(1), unless the authorization is terminated or revoked.  Performed at [facility], [place] [address], [place], [place] [address]     Time coordinating discharge: Approximately 40 minutes  Patrecia Pour, MD  [facility] 05/02/2021, 12:34 PM

## 2021-05-02 NOTE — Plan of Care (Signed)

## 2021-05-03 DIAGNOSIS — D631 Anemia in chronic kidney disease: Secondary | ICD-10-CM | POA: Diagnosis not present

## 2021-05-03 DIAGNOSIS — N186 End stage renal disease: Secondary | ICD-10-CM | POA: Diagnosis not present

## 2021-05-03 DIAGNOSIS — N2581 Secondary hyperparathyroidism of renal origin: Secondary | ICD-10-CM | POA: Diagnosis not present

## 2021-05-03 DIAGNOSIS — Z23 Encounter for immunization: Secondary | ICD-10-CM | POA: Diagnosis not present

## 2021-05-03 DIAGNOSIS — Z992 Dependence on renal dialysis: Secondary | ICD-10-CM | POA: Diagnosis not present

## 2021-05-05 DIAGNOSIS — Z992 Dependence on renal dialysis: Secondary | ICD-10-CM | POA: Diagnosis not present

## 2021-05-05 DIAGNOSIS — Z23 Encounter for immunization: Secondary | ICD-10-CM | POA: Diagnosis not present

## 2021-05-05 DIAGNOSIS — N186 End stage renal disease: Secondary | ICD-10-CM | POA: Diagnosis not present

## 2021-05-05 DIAGNOSIS — N2581 Secondary hyperparathyroidism of renal origin: Secondary | ICD-10-CM | POA: Diagnosis not present

## 2021-05-05 DIAGNOSIS — D631 Anemia in chronic kidney disease: Secondary | ICD-10-CM | POA: Diagnosis not present

## 2021-05-07 DIAGNOSIS — Z23 Encounter for immunization: Secondary | ICD-10-CM | POA: Diagnosis not present

## 2021-05-07 DIAGNOSIS — N2581 Secondary hyperparathyroidism of renal origin: Secondary | ICD-10-CM | POA: Diagnosis not present

## 2021-05-07 DIAGNOSIS — Z992 Dependence on renal dialysis: Secondary | ICD-10-CM | POA: Diagnosis not present

## 2021-05-07 DIAGNOSIS — D631 Anemia in chronic kidney disease: Secondary | ICD-10-CM | POA: Diagnosis not present

## 2021-05-07 DIAGNOSIS — N186 End stage renal disease: Secondary | ICD-10-CM | POA: Diagnosis not present

## 2021-05-09 DIAGNOSIS — I129 Hypertensive chronic kidney disease with stage 1 through stage 4 chronic kidney disease, or unspecified chronic kidney disease: Secondary | ICD-10-CM | POA: Diagnosis not present

## 2021-05-09 DIAGNOSIS — N186 End stage renal disease: Secondary | ICD-10-CM | POA: Diagnosis not present

## 2021-05-09 DIAGNOSIS — Z992 Dependence on renal dialysis: Secondary | ICD-10-CM | POA: Diagnosis not present

## 2021-05-10 DIAGNOSIS — Z23 Encounter for immunization: Secondary | ICD-10-CM | POA: Diagnosis not present

## 2021-05-10 DIAGNOSIS — N186 End stage renal disease: Secondary | ICD-10-CM | POA: Diagnosis not present

## 2021-05-10 DIAGNOSIS — Z992 Dependence on renal dialysis: Secondary | ICD-10-CM | POA: Diagnosis not present

## 2021-05-10 DIAGNOSIS — N2581 Secondary hyperparathyroidism of renal origin: Secondary | ICD-10-CM | POA: Diagnosis not present

## 2021-05-12 ENCOUNTER — Other Ambulatory Visit: Payer: Self-pay

## 2021-05-12 DIAGNOSIS — Z23 Encounter for immunization: Secondary | ICD-10-CM | POA: Diagnosis not present

## 2021-05-12 DIAGNOSIS — Z992 Dependence on renal dialysis: Secondary | ICD-10-CM

## 2021-05-12 DIAGNOSIS — N2581 Secondary hyperparathyroidism of renal origin: Secondary | ICD-10-CM | POA: Diagnosis not present

## 2021-05-12 DIAGNOSIS — N186 End stage renal disease: Secondary | ICD-10-CM

## 2021-05-13 ENCOUNTER — Ambulatory Visit (INDEPENDENT_AMBULATORY_CARE_PROVIDER_SITE_OTHER): Payer: Medicare Other | Admitting: Orthopedic Surgery

## 2021-05-13 ENCOUNTER — Telehealth: Payer: Self-pay | Admitting: Orthopedic Surgery

## 2021-05-13 ENCOUNTER — Ambulatory Visit (INDEPENDENT_AMBULATORY_CARE_PROVIDER_SITE_OTHER): Payer: Medicare Other

## 2021-05-13 ENCOUNTER — Other Ambulatory Visit: Payer: Self-pay

## 2021-05-13 DIAGNOSIS — L97529 Non-pressure chronic ulcer of other part of left foot with unspecified severity: Secondary | ICD-10-CM | POA: Diagnosis not present

## 2021-05-13 DIAGNOSIS — Z741 Need for assistance with personal care: Secondary | ICD-10-CM | POA: Diagnosis not present

## 2021-05-13 DIAGNOSIS — M79672 Pain in left foot: Secondary | ICD-10-CM | POA: Diagnosis not present

## 2021-05-13 DIAGNOSIS — M869 Osteomyelitis, unspecified: Secondary | ICD-10-CM

## 2021-05-13 DIAGNOSIS — L03032 Cellulitis of left toe: Secondary | ICD-10-CM | POA: Diagnosis not present

## 2021-05-13 DIAGNOSIS — N186 End stage renal disease: Secondary | ICD-10-CM | POA: Diagnosis not present

## 2021-05-13 DIAGNOSIS — L97522 Non-pressure chronic ulcer of other part of left foot with fat layer exposed: Secondary | ICD-10-CM | POA: Diagnosis not present

## 2021-05-13 DIAGNOSIS — Z992 Dependence on renal dialysis: Secondary | ICD-10-CM | POA: Diagnosis not present

## 2021-05-13 NOTE — Telephone Encounter (Signed)
Received call from Tommi Emery (OT) with [facility]  needing to know if there are any precautions with the hole in patient foot? The number to contact patient is [phone]

## 2021-05-14 DIAGNOSIS — N186 End stage renal disease: Secondary | ICD-10-CM | POA: Diagnosis not present

## 2021-05-14 DIAGNOSIS — Z992 Dependence on renal dialysis: Secondary | ICD-10-CM | POA: Diagnosis not present

## 2021-05-14 DIAGNOSIS — N2581 Secondary hyperparathyroidism of renal origin: Secondary | ICD-10-CM | POA: Diagnosis not present

## 2021-05-14 DIAGNOSIS — Z23 Encounter for immunization: Secondary | ICD-10-CM | POA: Diagnosis not present

## 2021-05-14 NOTE — Telephone Encounter (Signed)
Called and advised that pt is sch for toe amputation on // and will be wtb through heel only for transition for at least 2 weeks following surgery. Will call with any questions.

## 2021-05-15 ENCOUNTER — Other Ambulatory Visit: Payer: Self-pay | Admitting: Physician Assistant

## 2021-05-17 DIAGNOSIS — Z992 Dependence on renal dialysis: Secondary | ICD-10-CM | POA: Diagnosis not present

## 2021-05-17 DIAGNOSIS — N186 End stage renal disease: Secondary | ICD-10-CM | POA: Diagnosis not present

## 2021-05-17 DIAGNOSIS — Z23 Encounter for immunization: Secondary | ICD-10-CM | POA: Diagnosis not present

## 2021-05-17 DIAGNOSIS — N2581 Secondary hyperparathyroidism of renal origin: Secondary | ICD-10-CM | POA: Diagnosis not present

## 2021-05-17 NOTE — Progress Notes (Signed)
DUE TO COVID-19 ONLY ONE VISITOR IS ALLOWED TO COME WITH YOU AND STAY IN THE WAITING ROOM ONLY DURING PRE OP AND PROCEDURE DAY OF SURGERY.   PCP - Dr Domenick Gong Cardiologist - Dr Sherren Mocha EP Physician - Dr Virl Axe  Chest x-ray - 01/17/21 (1v) EKG - 02/03/21 Stress Test - 02/23/20 ECHO - 01/17/21 Cardiac Cath - N/A  ICD Pacemaker/Loop - N/A  Sleep Study -  Yes CPAP - does not use CPAP  Anesthesia review: Yes  STOP now taking any Aspirin (unless otherwise instructed by your surgeon), Aleve, Naproxen, Ibuprofen, Motrin, Advil, Goody's, BC's, all herbal medications, fish oil, and all vitamins.   Coronavirus Screening Covid test n/a Ambulatory Surgery  Do you have any of the following symptoms:  Cough yes/no: No Fever (>100.74F)  yes/no: No Runny nose yes/no: No Sore throat yes/no: No Difficulty breathing/shortness of breath  yes/no: No  Have you traveled in the last 14 days and where? yes/no: No  Patient verbalized understanding of instructions that were given via phone.

## 2021-05-18 ENCOUNTER — Encounter (HOSPITAL_COMMUNITY): Payer: Self-pay | Admitting: Orthopedic Surgery

## 2021-05-18 NOTE — Progress Notes (Signed)
Anesthesia Chart Review: Same day workup  Follows with cardiology for history of paroxysmal atrial fibrillation, tachycardia/bradycardia syndrome (had bradycardic arrest 04/20/2019, received CPR and epi and achieved ROSC - although per notes, questioned whether true arrest vs. altered LOC given bradycardia with poor perfusion possibly secondary to medication overdose), HFpEF, HTN.  Last seen 01/26/2021.  Per note, he was maintaining NSR and has not had any syncope or near syncope to suggest significant bradycardia.  Recommended continue amiodarone 200 mg daily.  He is not anticoagulated due to history of recurrent GI bleeding.   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  He has had history of repeat GI bleeding.  OSA, not on CPAP.  Recent ED evaluation 05/01/2021 for dizziness/falls.  Work-up was benign and he was discharged back to independent living facility.   Pt will need DOS labs and evaluation.    EKG 03/02/2021: NSR.  Rate 71.  LAD.  Nonspecific intraventricular conduction block.   TTE 01/17/21:  1. Left ventricular ejection fraction, by estimation, is 60 to 65%. The  left ventricle has normal function. The left ventricle has no regional  wall motion abnormalities. There is mild concentric left ventricular  hypertrophy. Left ventricular diastolic  function could not be evaluated.   2. Right ventricular systolic function is normal. The right ventricular  size is normal. There is normal pulmonary artery systolic pressure.   3. Left atrial size was severely dilated.   4. The mitral valve is normal in structure. Trivial mitral valve  regurgitation. No evidence of mitral stenosis.   5. The aortic valve is tricuspid. There is mild calcification of the  aortic valve. There is mild thickening of the aortic valve. Aortic valve  regurgitation is not  visualized. Mild aortic valve stenosis. Aortic valve  area, by VTI measures 3.20 cm.  Aortic valve mean gradient measures 12.0 mmHg. Aortic valve Vmax measures  2.35 m/s.   6. Aortic dilatation noted. There is mild dilatation of the aortic root,  measuring 40 mm.   7. The inferior vena cava is dilated in size with >50% respiratory  variability, suggesting right atrial pressure of 8 mmHg.    Event monitor 04/27/20: The basic rhythm is normal sinus with an average HR of 74 bpm No atrial fibrillation or flutter No high-grade heart block or pathologic pauses There are occasional PVC's and rare supraventricular beats without sustained arrhythmias   Nuclear stress 02/23/20: There was no ST segment deviation noted during stress. The study is normal. This is a low risk study. The left ventricular ejection fraction is normal (55-65%).   Wynonia Musty [facility]/Anesthesiology Phone [phone] 05/18/2021 12:21 PM

## 2021-05-18 NOTE — Anesthesia Preprocedure Evaluation (Addendum)
Anesthesia Evaluation  Patient identified by MRN, date of birth, ID band Patient awake    Reviewed: Allergy & Precautions, NPO status , Patient's Chart, lab work & pertinent test results  Airway Mallampati: III  TM Distance: >3 FB Neck ROM: Full    Dental  (+) Dental Advisory Given, Chipped, Teeth Intact   Pulmonary sleep apnea , former smoker,    Pulmonary exam normal breath sounds clear to auscultation       Cardiovascular hypertension, Pt. on home beta blockers and Pt. on medications + CAD and +CHF  Normal cardiovascular exam Rhythm:Regular Rate:Normal  Echo 01/2021 1. Left ventricular ejection fraction, by estimation, is 60 to 65%. The left ventricle has normal function. The left ventricle has no regional all motion abnormalities. There is mild concentric left ventricular hypertrophy. Left ventricular diastolic function could not be evaluated.  2. Right ventricular systolic function is normal. The right ventricular size is normal. There is normal pulmonary artery systolic pressure.  3. Left atrial size was severely dilated.  4. The mitral valve is normal in structure. Trivial mitral valve regurgitation. No evidence of mitral stenosis.  5. The aortic valve is tricuspid. There is mild calcification of the aortic valve. There is mild thickening of the aortic valve. Aortic valve regurgitation is not visualized. Mild aortic valve stenosis. Aortic valve area, by VTI measures 3.20 cm. Aortic valve mean gradient measures 12.0 mmHg. Aortic valve Vmax measures 2.35 m/s.  6. Aortic dilatation noted. There is mild dilatation of the aortic root, measuring 40 mm.  7. The inferior vena cava is dilated in size with >50% respiratory variability, suggesting right atrial pressure of 8 mmHg.    Neuro/Psych PSYCHIATRIC DISORDERS Depression  Neuromuscular disease    GI/Hepatic Neg liver ROS, GERD  ,  Endo/Other  negative endocrine ROS   Renal/GU Renal disease     Musculoskeletal  (+) Arthritis ,   Abdominal   Peds  Hematology negative hematology ROS (+) anemia ,   Anesthesia Other Findings   Reproductive/Obstetrics                                                            Anesthesia Evaluation  Patient identified by MRN, date of birth, ID band Patient awake    Reviewed: Allergy & Precautions, NPO status , Patient's Chart, lab work & pertinent test results, reviewed documented beta blocker date and time   History of Anesthesia Complications Negative for: history of anesthetic complications  Airway Mallampati: II  TM Distance: >3 FB Neck ROM: Full    Dental  (+) Dental Advisory Given   Pulmonary sleep apnea (no longer uses his CPAP) , former smoker,    breath sounds clear to auscultation       Cardiovascular hypertension, Pt. on medications and Pt. on home beta blockers + dysrhythmias Atrial Fibrillation  Rhythm:Regular Rate:Normal  01/2021 ECHO: EF 60-65%. The L ventricle has normal function, no regional wall motion abnormalities. There is mild concentric LVH, trivial MR, mild AS   Neuro/Psych Depression negative neurological ROS     GI/Hepatic GERD  Medicated and Controlled,(+) Cirrhosis   Esophageal Varices  substance abuse  alcohol use,   Endo/Other  negative endocrine ROS  Renal/GU ESRF and DialysisRenal disease (dialyzed yesterday, K+ 4.2)     Musculoskeletal  (+) Arthritis ,  Abdominal   Peds  Hematology  (+) Blood dyscrasia (Hb 11.2), anemia ,   Anesthesia Other Findings   Reproductive/Obstetrics                            Anesthesia Physical Anesthesia Plan  ASA: 3  Anesthesia Plan: Regional and MAC   Post-op Pain Management: Regional block   Induction:   PONV Risk Score and Plan: 1 and Ondansetron and Treatment may vary due to age or medical condition  Airway Management Planned: Natural Airway and  Simple Face Mask  Additional Equipment: None  Intra-op Plan:   Post-operative Plan:   Informed Consent: I have reviewed the patients History and Physical, chart, labs and discussed the procedure including the risks, benefits and alternatives for the proposed anesthesia with the patient or authorized representative who has indicated his/her understanding and acceptance.     Dental advisory given  Plan Discussed with: CRNA and Surgeon  Anesthesia Plan Comments: (Plan routine monitors, supraclavicular block)        Anesthesia Quick Evaluation  Anesthesia Physical Anesthesia Plan  ASA: 3  Anesthesia Plan: Regional   Post-op Pain Management: Minimal or no pain anticipated and Tylenol PO (pre-op)   Induction: Intravenous  PONV Risk Score and Plan: 1 and Ondansetron, Treatment may vary due to age or medical condition, Propofol infusion and TIVA  Airway Management Planned: Natural Airway  Additional Equipment:   Intra-op Plan:   Post-operative Plan:   Informed Consent: I have reviewed the patients History and Physical, chart, labs and discussed the procedure including the risks, benefits and alternatives for the proposed anesthesia with the patient or authorized representative who has indicated his/her understanding and acceptance.     Dental advisory given  Plan Discussed with: CRNA  Anesthesia Plan Comments: (PAT note by Karoline Caldwell, PA-C: Follows with cardiology for history of paroxysmal atrial fibrillation, tachycardia/bradycardia syndrome(had bradycardic arrest 04/20/2019, received CPR and epi and achieved ROSC - although per notes, questioned whether true arrest vs. altered LOC given bradycardia with poor perfusion possibly secondary to medication overdose), HFpEF, HTN. Last seen 01/26/2021. Per note, he was maintaining NSR and has not had any syncope or near syncope to suggest significant bradycardia. Recommended continue amiodarone 200 mg daily. He is not  anticoagulated due to history of recurrent GI bleeding.  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  He has had history of repeat GI bleeding.  OSA, not on CPAP.  Recent ED evaluation 05/01/2021 for dizziness/falls.  Work-up was benign and he was discharged back to independent living facility.  Pt will need DOS labs and evaluation.   EKG 03/02/2021: NSR.  Rate 71.  LAD.  Nonspecific intraventricular conduction block.  TTE 01/17/21: 1. Left ventricular ejection fraction, by estimation, is 60 to 65%. The  left ventricle has normal function. The left ventricle has no regional  wall motion abnormalities. There is mild concentric left ventricular  hypertrophy. Left ventricular diastolic function could not be evaluated.  2. Right ventricular systolic function is normal. The right ventricular  size is normal. There is normal pulmonary artery systolic pressure.  3. Left atrial size was severely dilated.  4. The mitral valve is normal in structure. Trivial mitral valve  regurgitation. No evidence of mitral stenosis.  5. The aortic valve is tricuspid. There is mild calcification of the  aortic  valve. There is mild thickening of the aortic valve. Aortic valve  regurgitation is not visualized. Mild aortic valve stenosis. Aortic valve  area, by VTI measures 3.20 cm.  Aortic valve mean gradient measures 12.0 mmHg. Aortic valve Vmax measures  2.35 m/s.  6. Aortic dilatation noted. There is mild dilatation of the aortic root,  measuring 40 mm.  7. The inferior vena cava is dilated in size with >50% respiratory  variability, suggesting right atrial pressure of 8 mmHg.   Event monitor 04/27/20: 1. The basic rhythm is normal sinus with an average HR of 74 bpm 2. No atrial fibrillation or flutter 3. No high-grade heart block or pathologic  pauses 4. There are occasional PVC's and rare supraventricular beats without sustained arrhythmias  Nuclear stress 02/23/20:  There was no ST segment deviation noted during stress.  The study is normal.  This is a low risk study.  The left ventricular ejection fraction is normal (55-65%). )      Anesthesia Quick Evaluation

## 2021-05-19 ENCOUNTER — Encounter (HOSPITAL_COMMUNITY)
Admission: RE | Disposition: A | Discharge status: Home / Self Care | Payer: Self-pay | Source: Home / Self Care | Attending: Orthopedic Surgery

## 2021-05-19 ENCOUNTER — Ambulatory Visit (HOSPITAL_COMMUNITY)
Admission: RE | Admit: 2021-05-19 | Discharge: 2021-05-19 | Disposition: A | Discharge status: Home / Self Care | Payer: Medicare Other | Attending: Orthopedic Surgery | Admitting: Orthopedic Surgery

## 2021-05-19 ENCOUNTER — Ambulatory Visit (HOSPITAL_COMMUNITY): Payer: Medicare Other | Admitting: Physician Assistant

## 2021-05-19 DIAGNOSIS — I480 Paroxysmal atrial fibrillation: Secondary | ICD-10-CM | POA: Diagnosis not present

## 2021-05-19 DIAGNOSIS — L97529 Non-pressure chronic ulcer of other part of left foot with unspecified severity: Secondary | ICD-10-CM | POA: Diagnosis not present

## 2021-05-19 DIAGNOSIS — G4733 Obstructive sleep apnea (adult) (pediatric): Secondary | ICD-10-CM | POA: Insufficient documentation

## 2021-05-19 DIAGNOSIS — M869 Osteomyelitis, unspecified: Secondary | ICD-10-CM

## 2021-05-19 DIAGNOSIS — I2510 Atherosclerotic heart disease of native coronary artery without angina pectoris: Secondary | ICD-10-CM | POA: Insufficient documentation

## 2021-05-19 DIAGNOSIS — I509 Heart failure, unspecified: Secondary | ICD-10-CM | POA: Diagnosis not present

## 2021-05-19 DIAGNOSIS — I5032 Chronic diastolic (congestive) heart failure: Secondary | ICD-10-CM | POA: Insufficient documentation

## 2021-05-19 DIAGNOSIS — Z79899 Other long term (current) drug therapy: Secondary | ICD-10-CM | POA: Insufficient documentation

## 2021-05-19 DIAGNOSIS — K7030 Alcoholic cirrhosis of liver without ascites: Secondary | ICD-10-CM | POA: Diagnosis not present

## 2021-05-19 DIAGNOSIS — N186 End stage renal disease: Secondary | ICD-10-CM | POA: Insufficient documentation

## 2021-05-19 DIAGNOSIS — L03032 Cellulitis of left toe: Secondary | ICD-10-CM | POA: Diagnosis not present

## 2021-05-19 DIAGNOSIS — Z992 Dependence on renal dialysis: Secondary | ICD-10-CM | POA: Diagnosis not present

## 2021-05-19 DIAGNOSIS — K219 Gastro-esophageal reflux disease without esophagitis: Secondary | ICD-10-CM | POA: Insufficient documentation

## 2021-05-19 DIAGNOSIS — I495 Sick sinus syndrome: Secondary | ICD-10-CM | POA: Diagnosis not present

## 2021-05-19 DIAGNOSIS — M1990 Unspecified osteoarthritis, unspecified site: Secondary | ICD-10-CM | POA: Insufficient documentation

## 2021-05-19 DIAGNOSIS — G709 Myoneural disorder, unspecified: Secondary | ICD-10-CM | POA: Diagnosis not present

## 2021-05-19 DIAGNOSIS — D631 Anemia in chronic kidney disease: Secondary | ICD-10-CM | POA: Diagnosis not present

## 2021-05-19 DIAGNOSIS — I8510 Secondary esophageal varices without bleeding: Secondary | ICD-10-CM | POA: Diagnosis not present

## 2021-05-19 DIAGNOSIS — Z87891 Personal history of nicotine dependence: Secondary | ICD-10-CM | POA: Insufficient documentation

## 2021-05-19 DIAGNOSIS — I132 Hypertensive heart and chronic kidney disease with heart failure and with stage 5 chronic kidney disease, or end stage renal disease: Secondary | ICD-10-CM | POA: Insufficient documentation

## 2021-05-19 DIAGNOSIS — F32A Depression, unspecified: Secondary | ICD-10-CM | POA: Insufficient documentation

## 2021-05-19 DIAGNOSIS — D649 Anemia, unspecified: Secondary | ICD-10-CM | POA: Diagnosis not present

## 2021-05-19 HISTORY — PX: AMPUTATION: SHX166

## 2021-05-19 HISTORY — DX: Personal history of other medical treatment: Z92.89

## 2021-05-19 LAB — POCT I-STAT, CHEM 8
BUN: 31 mg/dL — ABNORMAL HIGH (ref 8–23)
Calcium, Ion: 1.06 mmol/L — ABNORMAL LOW (ref 1.15–1.40)
Chloride: 94 mmol/L — ABNORMAL LOW (ref 98–111)
Creatinine, Ser: 4.4 mg/dL — ABNORMAL HIGH (ref 0.61–1.24)
Glucose, Bld: 81 mg/dL (ref 70–99)
HCT: 37 % — ABNORMAL LOW (ref 39.0–52.0)
Hemoglobin: 12.6 g/dL — ABNORMAL LOW (ref 13.0–17.0)
Potassium: 4.6 mmol/L (ref 3.5–5.1)
Sodium: 130 mmol/L — ABNORMAL LOW (ref 135–145)
TCO2: 29 mmol/L (ref 22–32)

## 2021-05-19 SURGERY — AMPUTATION DIGIT
Anesthesia: Regional | Site: Foot | Laterality: Left

## 2021-05-19 MED ORDER — ONDANSETRON HCL 4 MG/2ML IJ SOLN
INTRAMUSCULAR | Status: DC | PRN
  Administered 2021-05-19: 4 mg via INTRAVENOUS

## 2021-05-19 MED ORDER — CLONIDINE HCL (ANALGESIA) 100 MCG/ML EP SOLN
EPIDURAL | Status: DC | PRN
Start: 2021-05-19 — End: 2021-05-19
  Administered 2021-05-19: 50 ug

## 2021-05-19 MED ORDER — DEXAMETHASONE SODIUM PHOSPHATE 4 MG/ML IJ SOLN
INTRAMUSCULAR | Status: DC | PRN
Start: 2021-05-19 — End: 2021-05-19
  Administered 2021-05-19: 4 mg via PERINEURAL

## 2021-05-19 MED ORDER — MIDAZOLAM HCL 2 MG/2ML IJ SOLN
INTRAMUSCULAR | Status: AC
  Filled 2021-05-19: qty 2

## 2021-05-19 MED ORDER — ROPIVACAINE HCL 5 MG/ML IJ SOLN
INTRAMUSCULAR | Status: DC | PRN
Start: 2021-05-19 — End: 2021-05-19
  Administered 2021-05-19: 30 mL via PERINEURAL

## 2021-05-19 MED ORDER — CEFAZOLIN SODIUM-DEXTROSE 2-4 GM/100ML-% IV SOLN
2.0000 g | Freq: Once | INTRAVENOUS | Status: AC
  Administered 2021-05-19: 2 g via INTRAVENOUS

## 2021-05-19 MED ORDER — ORAL CARE MOUTH RINSE: 15.0000 mL | Freq: Once | OROMUCOSAL | Status: AC

## 2021-05-19 MED ORDER — HYDROCODONE-ACETAMINOPHEN 5-325 MG PO TABS: 1.0000 | ORAL_TABLET | ORAL | 0 refills | Status: DC | PRN

## 2021-05-19 MED ORDER — FENTANYL CITRATE (PF) 100 MCG/2ML IJ SOLN: 50.0000 ug | Freq: Once | INTRAMUSCULAR | Status: AC

## 2021-05-19 MED ORDER — SODIUM CHLORIDE 0.9 % IV SOLN: INTRAVENOUS | Status: DC

## 2021-05-19 MED ORDER — PROPOFOL 500 MG/50ML IV EMUL
INTRAVENOUS | Status: DC | PRN
  Administered 2021-05-19: 50 ug/kg/min via INTRAVENOUS

## 2021-05-19 MED ORDER — DROPERIDOL 2.5 MG/ML IJ SOLN: 0.6250 mg | Freq: Once | INTRAMUSCULAR | Status: DC | PRN

## 2021-05-19 MED ORDER — CHLORHEXIDINE GLUCONATE 0.12 % MT SOLN
15.0000 mL | Freq: Once | OROMUCOSAL | Status: AC
  Administered 2021-05-19: 15 mL via OROMUCOSAL
  Filled 2021-05-19: qty 15

## 2021-05-19 MED ORDER — ACETAMINOPHEN 500 MG PO TABS
1000.0000 mg | ORAL_TABLET | Freq: Once | ORAL | Status: AC
  Administered 2021-05-19: 1000 mg via ORAL
  Filled 2021-05-19: qty 2

## 2021-05-19 MED ORDER — FENTANYL CITRATE (PF) 100 MCG/2ML IJ SOLN: 25.0000 ug | INTRAMUSCULAR | Status: DC | PRN

## 2021-05-19 MED ORDER — PROPOFOL 10 MG/ML IV BOLUS
INTRAVENOUS | Status: DC | PRN
  Administered 2021-05-19: 20 mg via INTRAVENOUS
  Administered 2021-05-19: 25 mg via INTRAVENOUS

## 2021-05-19 MED ORDER — 0.9 % SODIUM CHLORIDE (POUR BTL) OPTIME
TOPICAL | Status: DC | PRN
  Administered 2021-05-19: 1000 mL

## 2021-05-19 MED ORDER — FENTANYL CITRATE (PF) 100 MCG/2ML IJ SOLN
INTRAMUSCULAR | Status: AC
  Administered 2021-05-19: 50 ug via INTRAVENOUS
  Filled 2021-05-19: qty 2

## 2021-05-19 SURGICAL SUPPLY — 27 items
BAG COUNTER SPONGE SURGICOUNT (BAG) ×2 IMPLANT
BLADE SURG 21 STRL SS (BLADE) ×2 IMPLANT
BNDG COHESIVE 4X5 TAN STRL (GAUZE/BANDAGES/DRESSINGS) ×2 IMPLANT
BNDG ESMARK 4X9 LF (GAUZE/BANDAGES/DRESSINGS) IMPLANT
BNDG GAUZE ELAST 4 BULKY (GAUZE/BANDAGES/DRESSINGS) ×2 IMPLANT
COVER SURGICAL LIGHT HANDLE (MISCELLANEOUS) ×4 IMPLANT
DRAPE U-SHAPE 47X51 STRL (DRAPES) ×2 IMPLANT
DRSG ADAPTIC 3X8 NADH LF (GAUZE/BANDAGES/DRESSINGS) IMPLANT
DRSG PAD ABDOMINAL 8X10 ST (GAUZE/BANDAGES/DRESSINGS) ×2 IMPLANT
DURAPREP 26ML APPLICATOR (WOUND CARE) ×2 IMPLANT
ELECT REM PT RETURN 9FT ADLT (ELECTROSURGICAL) ×2
ELECTRODE REM PT RTRN 9FT ADLT (ELECTROSURGICAL) ×1 IMPLANT
GAUZE SPONGE 4X4 12PLY STRL (GAUZE/BANDAGES/DRESSINGS) IMPLANT
GLOVE SURG ORTHO LTX SZ9 (GLOVE) ×2 IMPLANT
GLOVE SURG UNDER POLY LF SZ9 (GLOVE) ×2 IMPLANT
GOWN STRL REUS W/ TWL XL LVL3 (GOWN DISPOSABLE) ×2 IMPLANT
GOWN STRL REUS W/TWL XL LVL3 (GOWN DISPOSABLE) ×2
KIT BASIN OR (CUSTOM PROCEDURE TRAY) ×2 IMPLANT
KIT TURNOVER KIT B (KITS) ×2 IMPLANT
MANIFOLD NEPTUNE II (INSTRUMENTS) ×2 IMPLANT
NEEDLE 22X1 1/2 (OR ONLY) (NEEDLE) IMPLANT
NS IRRIG 1000ML POUR BTL (IV SOLUTION) ×2 IMPLANT
PACK ORTHO EXTREMITY (CUSTOM PROCEDURE TRAY) ×2 IMPLANT
PAD ARMBOARD 7.5X6 YLW CONV (MISCELLANEOUS) ×4 IMPLANT
SUT ETHILON 2 0 PSLX (SUTURE) ×2 IMPLANT
SYR CONTROL 10ML LL (SYRINGE) IMPLANT
TOWEL GREEN STERILE (TOWEL DISPOSABLE) ×2 IMPLANT

## 2021-05-19 NOTE — Anesthesia Procedure Notes (Signed)
Anesthesia Regional Block: Popliteal block   Pre-Anesthetic Checklist: , timeout performed,  Correct Patient, Correct Site, Correct Laterality,  Correct Procedure, Correct Position, site marked,  Risks and benefits discussed,  Surgical consent,  Pre-op evaluation,  At surgeon's request and post-op pain management  Laterality: Lower and Left  Prep: chloraprep       Needles:  Injection technique: Single-shot  Needle Type: Stimiplex     Needle Length: 10cm  Needle Gauge: 21     Additional Needles:   Procedures:,,,, ultrasound used (permanent image in chart),,   Motor weakness within 5 minutes.  Narrative:  Start time: 05/19/2021 10:39 AM End time: 05/19/2021 10:59 AM Injection made incrementally with aspirations every 5 mL.  Performed by: Personally  Anesthesiologist: Nolon Nations, MD  Additional Notes: Nerve located and needle positioned with direct ultrasound guidance. Good perineural spread. Patient tolerated well.

## 2021-05-19 NOTE — Transfer of Care (Signed)
Immediate Anesthesia Transfer of Care Note  Patient: Darryl Diaz  Procedure(s) Performed: LEFT GREAT TOE AMPUTATION (Left: Foot)  Patient Location: PACU  Anesthesia Type:MAC combined with regional for post-op pain  Level of Consciousness: awake and alert   Airway & Oxygen Therapy: Patient Spontanous Breathing and Patient connected to nasal cannula oxygen  Post-op Assessment: Report given to RN and Post -op Vital signs reviewed and stable  Post vital signs: Reviewed and stable  Last Vitals:  Vitals Value Taken Time  BP 109/50 05/19/21 1256  Temp    Pulse 51 05/19/21 1257  Resp 21 05/19/21 1257  SpO2 100 % 05/19/21 1257  Vitals shown include unvalidated device data.  Last Pain:  Vitals:   05/19/21 0947  TempSrc: Oral         Complications: No notable events documented.

## 2021-05-19 NOTE — Op Note (Signed)
05/19/2021  12:49 PM  PATIENT:  [name]    PRE-OPERATIVE DIAGNOSIS:  Osteomyelitis Left Great Toe  POST-OPERATIVE DIAGNOSIS:  Same  PROCEDURE:  LEFT GREAT TOE AMPUTATION  SURGEON:  Newt Minion, MD  PHYSICIAN ASSISTANT:None ANESTHESIA:   General  PREOPERATIVE INDICATIONS:  Darryl Diaz is a  81 y.o. male with a diagnosis of Osteomyelitis Left Great Toe who failed conservative measures and elected for surgical management.    The risks benefits and alternatives were discussed with the patient preoperatively including but not limited to the risks of infection, bleeding, nerve injury, cardiopulmonary complications, the need for revision surgery, among others, and the patient was willing to proceed.  OPERATIVE IMPLANTS: None  @ENCIMAGES @  OPERATIVE FINDINGS: Tissue margins clear.  OPERATIVE PROCEDURE: Patient was brought the operating room after undergoing a regional anesthetic.  After adequate levels anesthesia were obtained patient's left lower extremity was prepped using DuraPrep draped into a sterile field a timeout was called.  A fishmouth incision was made just distal to the MTP joint.  This was carried down through the MTP joint.  The margins were clear.  Electrocardio was used for hemostasis the wound was irrigated with normal saline.  The incision was closed using 2-0 nylon a sterile dressing was applied patient was taken the PACU in stable condition.   DISCHARGE PLANNING:  Antibiotic duration: Preoperative antibiotics  Weightbearing: Touchdown weightbearing on the left  Pain medication: Prescription for hydrocodone  Dressing care/ Wound VAC: Change dressing as needed  Ambulatory devices: Walker or crutches  Discharge to: Back to assisted living  Follow-up: In the office 1 week post operative.

## 2021-05-19 NOTE — H&P (Signed)
Darryl Diaz is an 81 y.o. male.   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[facility])    Atrial fibrillation (H[facility])    Cardiac arrest ([place]) 04/20/2019   12 min CPR with epi   Chronic diastolic CHF (congestive heart failure) ([place])    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[place])    Mon Wed Fri   Fatty liver    Gastritis    GERD (gastroesophageal reflux disease)    H/O seasonal allergies    History of blood transfusion    History of GI bleed    Hx of adenomatous colonic polyps 08/10/2017   Hyperlipidemia    Hypertension    Insomnia    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   Mallory-Weiss tear    OA (osteoarthritis)    hands   OSA (obstructive sleep apnea)    No longer uses CPAP   Persistent atrial fibrillation with rapid ventricular response ([place]) 02/05/2020   Recurrent syncope    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[facility])     Past Surgical History:  Procedure Laterality Date   ANKLE SURGERY Left 2014   had rods put in    AV FISTULA PLACEMENT Left 04/21/2021   Procedure: LEFT ARM ARTERIOVENOUS (AV) FISTULA CREATION - Left brachiocephalic fistula;  Surgeon: Marty Heck, MD;  Location: [place];  Service: Vascular;  Laterality: Left;   BIOPSY  02/28/2019   Procedure: BIOPSY;  Surgeon: Milus Banister, MD;  Location: [place];  Service: Endoscopy;;   CARDIOVERSION  02/06/2020   CARDIOVERSION N/A 02/06/2020   Procedure: CARDIOVERSION;  Surgeon: Werner Lean, MD;  Location: [place] ENDOSCOPY;  Service: Cardiovascular;  Laterality: N/A;   CARDIOVERSION N/A 02/24/2020   Procedure: CARDIOVERSION;  Surgeon: Werner Lean, MD;  Location: [place];  Service: Cardiovascular;  Laterality:  N/A;   COLONOSCOPY     [place]   COLONOSCOPY WITH PROPOFOL N/A 01/08/2021   Procedure: COLONOSCOPY WITH PROPOFOL;  Surgeon: Milus Banister, MD;  Location: [facility] ENDOSCOPY;  Service: Endoscopy;  Laterality: N/A;   ESOPHAGOGASTRODUODENOSCOPY (EGD) WITH PROPOFOL N/A 02/28/2019   Procedure: ESOPHAGOGASTRODUODENOSCOPY (EGD) WITH PROPOFOL;  Surgeon: Milus Banister, MD;  Location[facility] ENDOSCOPY;  Service: Endoscopy;  Laterality: N/A;   HIP ARTHROPLASTY Left 04/01/2016   Procedure: ARTHROPLASTY BIPOLAR HIP (HEMIARTHROPLASTY);  Surgeon: Paralee Cancel, MD;  Location: [place];  Service: Orthopedics;  Laterality: Left;   HIP CLOSED REDUCTION Left 04/30/2016   Procedure: CLOSED REDUCTION HIP;  Surgeon: Wylene Simmer, MD;  Location: [place];  Service: Orthopedics;  Laterality: Left;   HOT HEMOSTASIS N/A 01/08/2021   Procedure: HOT HEMOSTASIS (ARGON PLASMA COAGULATION/BICAP);  Surgeon: Milus Banister, MD;  Location: [facility] ENDOSCOPY;  Service: Endoscopy;  Laterality: N/A;   IR RADIOLOGIST EVAL & MGMT  12/31/2020   TONSILLECTOMY     TOTAL HIP ARTHROPLASTY Right 10/15/2019   Procedure: TOTAL HIP ARTHROPLASTY ANTERIOR APPROACH;  Surgeon: Paralee Cancel, MD;  Location: [facility];  Service: Orthopedics;  Laterality: Right;  70 mins    Family History  Problem Relation Age of Onset   Heart disease Mother 76   Hypertension Mother    Arthritis Mother    Heart failure Father 15  Stroke Maternal Aunt    Heart failure Sister    Colon cancer Neg Hx    Esophageal cancer Neg Hx    Stomach cancer Neg Hx    Rectal cancer Neg Hx    Social History:  reports that he has quit smoking. His smoking use included cigarettes. He has a 20.00 pack-year smoking history. He has never used smokeless tobacco. He reports that he does not currently use alcohol after a past usage of about 3.0 standard drinks per week. He reports that he does not use drugs.  Allergies:  Allergies  Allergen Reactions   Penicillins Rash    Medications Prior to  Admission  Medication Sig Dispense Refill   acetaminophen (TYLENOL) 325 MG tablet Take 2 tablets (650 mg total) by mouth every 4 (four) hours as needed for headache or mild pain.     amiodarone (PACERONE) 200 MG tablet TAKE 1 TABLET BY MOUTH ONCE DAILY 90 tablet 2   atorvastatin (LIPITOR) 20 MG tablet Take 1 tablet (20 mg total) by mouth daily. 30 tablet 0   carvedilol (COREG) 6.25 MG tablet Take 1 tablet (6.25 mg total) by mouth 2 (two) times daily. 180 tablet 3   cetirizine (ZYRTEC) 10 MG tablet Take 10 mg by mouth daily.     escitalopram (LEXAPRO) 10 MG tablet Take 1 tablet (10 mg total) by mouth daily. 30 tablet 0   folic acid (FOLVITE) 017 MCG tablet Take 400 mcg by mouth daily.     Multiple Vitamins-Minerals (CENTRUM SILVER 50+MEN) TABS Take 1 tablet by mouth daily with breakfast.     pantoprazole (PROTONIX) 40 MG tablet Take 1 tablet (40 mg total) by mouth daily. 30 tablet 0   sodium bicarbonate 650 MG tablet Take 1 tablet (650 mg total) by mouth 2 (two) times daily.     torsemide 40 MG TABS Take 40 mg by mouth daily.     traZODone (DESYREL) 50 MG tablet Take 1 tablet (50 mg total) by mouth at bedtime. 30 tablet 0   ferrous sulfate 325 (65 FE) MG tablet Take 1 tablet (325 mg total) by mouth daily. (Patient not taking: Reported on 05/01/2021)     HYDROcodone-acetaminophen (NORCO) 5-325 MG tablet Take 1 tablet by mouth every 6 (six) hours as needed for moderate pain. 10 tablet 0   ketoconazole (NIZORAL) 2 % cream Apply 1 application topically daily. To dry scaly skin on feet (Patient taking differently: Apply 1 application topically daily as needed for irritation.) 60 g 2   Magnesium Oxide 400 MG CAPS Take 1 capsule (400 mg total) by mouth daily.     nitroGLYCERIN (NITROSTAT) 0.4 MG SL tablet Place 1 tablet (0.4 mg total) under the tongue every 5 (five) minutes x 3 doses as needed for chest pain. 30 tablet 12   nystatin (MYCOSTATIN/NYSTOP) powder Apply topically 2 (two) times daily. (Patient  taking differently: Apply 1 application topically 2 (two) times daily as needed.) 15 g 0   potassium chloride SA (KLOR-CON) 20 MEQ tablet Take 1 tablet (20 mEq total) by mouth daily. 90 tablet 3    Results for orders placed or performed during the hospital encounter of 05/19/21 (from the past 48 hour(s))  I-STAT, chem 8     Status: Abnormal   Collection Time: 05/19/21 10:04 AM  Result Value Ref Range   Sodium 130 (L) 135 - 145 mmol/L   Potassium 4.6 3.5 - 5.1 mmol/L   Chloride 94 (L) 98 - 111 mmol/L   BUN  31 (H) 8 - 23 mg/dL   Creatinine, Ser 4.40 (H) 0.61 - 1.24 mg/dL   Glucose, Bld 81 70 - 99 mg/dL    Comment: Glucose reference range applies only to samples taken after fasting for at least 8 hours.   Calcium, Ion 1.06 (L) 1.15 - 1.40 mmol/L   TCO2 29 22 - 32 mmol/L   Hemoglobin 12.6 (L) 13.0 - 17.0 g/dL   HCT 37.0 (L) 39.0 - 52.0 %   No results found.  Review of Systems  All other systems reviewed and are negative.  Blood pressure (!) 165/54, pulse (!) 58, temperature 98.3 F (36.8 C), temperature source Oral, resp. rate 17, height 6' (1.829 m), weight 92.5 kg, SpO2 100 %. Physical Exam  Patient is alert oriented no adenopathy well-dressed normal affect normal respiratory effort.  Examination is cellulitis swelling ulceration of the left great toe. Assessment/Plan Assessment: Osteomyelitis left great toe with swelling and ulceration.  Plan: We will plan for left great toe amputation of the MTP joint.  Risk and benefits were discussed including risk of the wound not healing.  Patient states he understands wished to proceed at this time.  Newt Minion, MD 05/19/2021, 11:58 AM

## 2021-05-19 NOTE — Progress Notes (Signed)
Pt in Phase II. Vital signs are stable. Monitoring not required. Waiting on pt transportation.

## 2021-05-19 NOTE — Anesthesia Postprocedure Evaluation (Signed)
Anesthesia Post Note  Patient: Darryl Diaz  Procedure(s) Performed: LEFT GREAT TOE AMPUTATION (Left: Foot)     Patient location during evaluation: PACU Anesthesia Type: Regional Level of consciousness: awake and alert Pain management: pain level controlled Vital Signs Assessment: post-procedure vital signs reviewed and stable Respiratory status: spontaneous breathing Cardiovascular status: stable Anesthetic complications: no   No notable events documented.  Last Vitals:  Vitals:   05/19/21 1255 05/19/21 1310  BP: (!) 109/50 (!) 118/53  Pulse: (!) 51 (!) 52  Resp: 17 17  Temp: 36.4 C 36.4 C  SpO2: 99% 100%    Last Pain:  Vitals:   05/19/21 1310  TempSrc:   PainSc: 0-No pain                 Nolon Nations

## 2021-05-20 ENCOUNTER — Encounter (HOSPITAL_COMMUNITY): Payer: Self-pay | Admitting: Orthopedic Surgery

## 2021-05-20 DIAGNOSIS — Z992 Dependence on renal dialysis: Secondary | ICD-10-CM | POA: Diagnosis not present

## 2021-05-20 DIAGNOSIS — N2581 Secondary hyperparathyroidism of renal origin: Secondary | ICD-10-CM | POA: Diagnosis not present

## 2021-05-20 DIAGNOSIS — N186 End stage renal disease: Secondary | ICD-10-CM | POA: Diagnosis not present

## 2021-05-20 DIAGNOSIS — Z23 Encounter for immunization: Secondary | ICD-10-CM | POA: Diagnosis not present

## 2021-05-21 DIAGNOSIS — H33031 Retinal detachment with giant retinal tear, right eye: Secondary | ICD-10-CM | POA: Diagnosis not present

## 2021-05-21 DIAGNOSIS — H35432 Paving stone degeneration of retina, left eye: Secondary | ICD-10-CM | POA: Diagnosis not present

## 2021-05-21 DIAGNOSIS — H35362 Drusen (degenerative) of macula, left eye: Secondary | ICD-10-CM | POA: Diagnosis not present

## 2021-05-21 DIAGNOSIS — H2512 Age-related nuclear cataract, left eye: Secondary | ICD-10-CM | POA: Diagnosis not present

## 2021-05-21 DIAGNOSIS — H43813 Vitreous degeneration, bilateral: Secondary | ICD-10-CM | POA: Diagnosis not present

## 2021-05-21 DIAGNOSIS — Z20822 Contact with and (suspected) exposure to covid-19: Secondary | ICD-10-CM | POA: Diagnosis not present

## 2021-05-21 DIAGNOSIS — H40023 Open angle with borderline findings, high risk, bilateral: Secondary | ICD-10-CM | POA: Diagnosis not present

## 2021-05-21 DIAGNOSIS — Z961 Presence of intraocular lens: Secondary | ICD-10-CM | POA: Diagnosis not present

## 2021-05-24 DIAGNOSIS — N186 End stage renal disease: Secondary | ICD-10-CM | POA: Diagnosis not present

## 2021-05-24 DIAGNOSIS — Z89412 Acquired absence of left great toe: Secondary | ICD-10-CM | POA: Diagnosis not present

## 2021-05-24 DIAGNOSIS — N2581 Secondary hyperparathyroidism of renal origin: Secondary | ICD-10-CM | POA: Diagnosis not present

## 2021-05-24 DIAGNOSIS — Z992 Dependence on renal dialysis: Secondary | ICD-10-CM | POA: Diagnosis not present

## 2021-05-24 DIAGNOSIS — Z23 Encounter for immunization: Secondary | ICD-10-CM | POA: Diagnosis not present

## 2021-05-25 ENCOUNTER — Ambulatory Visit (HOSPITAL_COMMUNITY)
Admission: RE | Admit: 2021-05-25 | Discharge: 2021-05-25 | Disposition: A | Discharge status: Home / Self Care | Payer: Medicare Other | Source: Ambulatory Visit | Attending: Vascular Surgery | Admitting: Vascular Surgery

## 2021-05-25 ENCOUNTER — Ambulatory Visit (INDEPENDENT_AMBULATORY_CARE_PROVIDER_SITE_OTHER): Payer: Medicare Other | Admitting: Physician Assistant

## 2021-05-25 ENCOUNTER — Other Ambulatory Visit: Payer: Self-pay

## 2021-05-25 VITALS — BP 136/60 | HR 61 | Temp 97.9°F | Resp 18 | Ht 72.0 in | Wt 200.0 lb

## 2021-05-25 DIAGNOSIS — Z992 Dependence on renal dialysis: Secondary | ICD-10-CM

## 2021-05-25 DIAGNOSIS — N186 End stage renal disease: Secondary | ICD-10-CM

## 2021-05-25 DIAGNOSIS — M869 Osteomyelitis, unspecified: Secondary | ICD-10-CM | POA: Insufficient documentation

## 2021-05-25 DIAGNOSIS — L97529 Non-pressure chronic ulcer of other part of left foot with unspecified severity: Secondary | ICD-10-CM | POA: Insufficient documentation

## 2021-05-25 NOTE — Progress Notes (Signed)
° ° °  Postoperative Access Visit   History of Present Illness   Darryl Diaz is a 81 y.o. year old male who presents for postoperative follow-up for: left brachiocephalic arteriovenous fistula (Date: 04/21/21).  The patient's wounds are healed.  The patient denies steal symptoms.  The patient is able to complete their activities of daily living.  He is currently dialyzing via right IJ [facility] on a Monday Wednesday Friday schedule at the [facility] kidney center in [place].  Physical Examination   Vitals:   05/25/21 0953  BP: 136/60  Pulse: 61  Resp: 18  Temp: 97.9 F (36.6 C)  TempSrc: Temporal  SpO2: 100%  Weight: 200 lb (90.7 kg)  Height: 6' (1.829 m)   Body mass index is 27.12 kg/m.  left arm Incision is healed, palpable radial pulse, hand grip is 5/5, sensation in digits is intact, palpable thrill, bruit can be auscultated     Medical Decision Making   [name] is a 81 y.o. year old male who presents s/p left brachiocephalic arteriovenous fistula  Patent brachiocephalic fistula without signs or symptoms of steal syndrome The patient's access will be ready for use 07/19/21 The patient's tunneled dialysis catheter can be removed when Nephrology is comfortable with the performance of the fistula The patient may follow up on a prn basis   Dagoberto Ligas PA-C [facility] Office: [phone]  Clinic MD: Carlis Abbott

## 2021-05-26 DIAGNOSIS — Z992 Dependence on renal dialysis: Secondary | ICD-10-CM | POA: Diagnosis not present

## 2021-05-26 DIAGNOSIS — Z23 Encounter for immunization: Secondary | ICD-10-CM | POA: Diagnosis not present

## 2021-05-26 DIAGNOSIS — N2581 Secondary hyperparathyroidism of renal origin: Secondary | ICD-10-CM | POA: Diagnosis not present

## 2021-05-26 DIAGNOSIS — N186 End stage renal disease: Secondary | ICD-10-CM | POA: Diagnosis not present

## 2021-05-27 DIAGNOSIS — L0390 Cellulitis, unspecified: Secondary | ICD-10-CM | POA: Diagnosis not present

## 2021-05-27 DIAGNOSIS — N186 End stage renal disease: Secondary | ICD-10-CM | POA: Diagnosis not present

## 2021-05-27 DIAGNOSIS — M86172 Other acute osteomyelitis, left ankle and foot: Secondary | ICD-10-CM | POA: Diagnosis not present

## 2021-05-28 DIAGNOSIS — N186 End stage renal disease: Secondary | ICD-10-CM | POA: Diagnosis not present

## 2021-05-28 DIAGNOSIS — Z23 Encounter for immunization: Secondary | ICD-10-CM | POA: Diagnosis not present

## 2021-05-28 DIAGNOSIS — N2581 Secondary hyperparathyroidism of renal origin: Secondary | ICD-10-CM | POA: Diagnosis not present

## 2021-05-28 DIAGNOSIS — Z992 Dependence on renal dialysis: Secondary | ICD-10-CM | POA: Diagnosis not present

## 2021-05-31 DIAGNOSIS — N186 End stage renal disease: Secondary | ICD-10-CM | POA: Diagnosis not present

## 2021-05-31 DIAGNOSIS — N2581 Secondary hyperparathyroidism of renal origin: Secondary | ICD-10-CM | POA: Diagnosis not present

## 2021-05-31 DIAGNOSIS — Z992 Dependence on renal dialysis: Secondary | ICD-10-CM | POA: Diagnosis not present

## 2021-05-31 DIAGNOSIS — Z23 Encounter for immunization: Secondary | ICD-10-CM | POA: Diagnosis not present

## 2021-05-31 NOTE — Progress Notes (Signed)
Cardiology Office Note:    Date:  06/01/2021   ID:  Darryl Diaz, DOB 13-Apr-1941, MRN [id number]  PCP:  [name], MD  [facility] Providers Cardiologist:  Sherren Mocha, MD Cardiology APP:  Liliane Shi, PA-C  Electrophysiologist:  Virl Axe, MD     Referring MD: [name], MD   Chief Complaint:  F/u for CHF, AFib    Patient Profile: Paroxysmal atrial fibrillation  A/c held in 08/2018 due to posthemorrhagic anemia 2/2 fall Amio started during admit w MW tear, AF w RVR in 02/2019>>DCd due to brady in 04/2019 Admx 9/21 w syncope, AF w RVR>>DCCV>>ERAF Flecainide >> DCCV>>ERAF >>Amiodarone (CCB, BB DCd) Not a candidate for Sotalol due to brady; Dofetilide due to long QT Will need PPM if recurrent brady; although not ideal candidate due to infection/wound care issues  Admx 11/21: AF w RVR in setting of Salmonella gastroenteritis >> Amiodarone adjusted>>NSR anticoagulation DCd in 9/22 due to anemia 2/2 GI bleeding; frailty [Pt not a candidate for LAAOD] Tachy-Brady syndrome Symptomatic bradycardia admx 04/2019 >> Amiodarone, Diltiazem, Metoprolol DC'd Eval by EP 1/21 (Dr. 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 Joelyn Oms  Hypertension  Hyperlipidemia  Hx of UGI bleed s/p MW tear in 02/2019 Hx of ETOH abuse Hepatic cirrhosis  Sleep apnea  Admitted w salmonella gastroenteritis in 11/21 c/b AKI, AF w RVR, falls Frequent falls  Orthostatic hypotension Midodrine Rx  Hx of TIA in 5/22 Chronic anemia Hx of Lower GI bleed in 9/22 Osteomyelitis s/p L great toe amp in 1/23 Aortic atherosclerosis  DNR  Prior CV Studies: Echocardiogram 01/17/21 EF 60-65, no RWMA, mild LVH, normal RVSF, severe LAE, trivial  MR, mild aortic stenosis (mean gradient 26mmHg, V-max 235 cm/s), mild dilation of aortic root (40 mm)   Echocardiogram 09/08/20 EF 60-65, no RWMA, mild LVH, G1 DD, normal RVSF, mild LAE, mild AV sclerosis, dilation of aortic root (41 mm)   Myoview 02/23/20  Normal EF, normal perfusion; low risk   History of Present Illness:   Darryl Diaz is a 81 y.o. male with the above problem list.  He was last seen 02/03/21.  Please refer to HPI in that note for detail of interval hx.  He returns for f/u.  Since last seen, he has started on hemodialysis.  He has also had a L great toe amputation for osteomyelitis.  He is here alone today.   He has some phantom pain where his L great toe used to be.  He feels his shortness of breath is unchanged.  He has not had chest pain or syncope.  He does fall a lot due to lost balance.  He has not had orthopnea.  His L leg has been swollen since his toe amputation.  His R leg edema has improved.  He is still on Torsemide.  He goes to dialysis MWF.      Past Medical History:  Diagnosis Date   Alcohol abuse 66/10/3014   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[place]) 03/17/2020   Alcoholism ([place])    in remission following wife's death   Anemia    Ascending aorta dilation (HCC)    Atrial fibrillation ([place])    Cardiac arrest ([place]) 04/20/2019   12 min CPR with  epi   Chronic diastolic CHF (congestive heart failure) (HCC)    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[place])    Mon Wed Fri   Fatty liver    Gastritis    GERD (gastroesophageal reflux disease)    H/O seasonal allergies    History of blood transfusion    History of GI bleed    Hx of adenomatous colonic polyps 08/10/2017   Hyperlipidemia    Hypertension    Insomnia    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   Mallory-Weiss tear    OA (osteoarthritis)    hands   OSA (obstructive sleep apnea)    No longer uses CPAP   Persistent atrial fibrillation with rapid ventricular response (HCC)  02/05/2020   Recurrent syncope    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(HCC)    Current Medications: Current Meds  Medication Sig   acetaminophen (TYLENOL) 325 MG tablet Take 2 tablets (650 mg total) by mouth every 4 (four) hours as needed for headache or mild pain.   amiodarone (PACERONE) 200 MG tablet TAKE 1 TABLET BY MOUTH ONCE DAILY   atorvastatin (LIPITOR) 20 MG tablet Take 1 tablet (20 mg total) by mouth daily.   carvedilol (COREG) 6.25 MG tablet Take 1 tablet (6.25 mg total) by mouth 2 (two) times daily.   cetirizine (ZYRTEC) 10 MG tablet Take 10 mg by mouth daily.   escitalopram (LEXAPRO) 10 MG tablet Take 1 tablet (10 mg total) by mouth daily.   ferrous sulfate 325 (65 FE) MG tablet Take 1 tablet (325 mg total) by mouth daily.   folic acid (FOLVITE) 062 MCG tablet Take 400 mcg by mouth daily.   HYDROcodone-acetaminophen (NORCO/VICODIN) 5-325 MG tablet Take 1 tablet by mouth every 4 (four) hours as needed.   ketoconazole (NIZORAL) 2 % cream Apply 1 application topically daily. To dry scaly skin on feet   Magnesium Oxide 400 MG CAPS Take 1 capsule (400 mg total) by mouth daily.   Multiple Vitamins-Minerals (CENTRUM SILVER 50+MEN) TABS Take 1 tablet by mouth daily with breakfast.   nitroGLYCERIN (NITROSTAT) 0.4 MG SL tablet Place 1 tablet (0.4 mg total) under the tongue every 5 (five) minutes x 3 doses as needed for chest pain.   nystatin (MYCOSTATIN/NYSTOP) powder Apply topically 2 (two) times daily.   pantoprazole (PROTONIX) 40 MG tablet Take 1 tablet (40 mg total) by mouth daily.   potassium chloride SA (KLOR-CON) 20 MEQ tablet Take 1 tablet (20 mEq total) by mouth daily.   sodium bicarbonate 650 MG tablet Take 1 tablet (650 mg total) by mouth 2 (two) times daily.   torsemide 40 MG TABS Take 40 mg by mouth daily.   traZODone (DESYREL) 50 MG tablet Take 1 tablet (50 mg total) by mouth at bedtime.    Allergies:   Penicillins   Social History   Tobacco Use   Smoking status: Former     Packs/day: 1.00    Years: 20.00    Pack years: 20.00    Types: Cigarettes   Smokeless tobacco: Never   Tobacco comments:       quit 20 years  smoked on and off for 20 years  Vaping Use   Vaping Use: Never used  Substance Use Topics   Alcohol use: Not Currently    Alcohol/week: 3.0 standard drinks    Types: 3 Glasses of wine per week    Comment: h/o heavy use   Drug use: Never    Family Hx: The patient's family  history includes Arthritis in his mother; Heart disease (age of onset: 79) in his mother; Heart failure in his sister; Heart failure (age of onset: 39) in his father; Hypertension in his mother; Stroke in his maternal aunt. There is no history of Colon cancer, Esophageal cancer, Stomach cancer, or Rectal cancer.  ROS see HPI  EKGs/Labs/Other Test Reviewed:    EKG:  EKG is   ordered today.  The ekg ordered today demonstrates NSR, HR 61, inf Qs, ant-sept Qs, no ST-ST changes, QTc 503 ms  Recent Labs: 01/09/2021: Magnesium 2.0 01/19/2021: B Natriuretic Peptide 607.4 03/11/2021: TSH 6.700 05/02/2021: ALT 15; Platelets 149 05/19/2021: BUN 31; Creatinine, Ser 4.40; Hemoglobin 12.6; Potassium 4.6; Sodium 130   Recent Lipid Panel Recent Labs    09/07/20 2142  CHOL 109  TRIG 40  HDL 55  VLDL 8  LDLCALC 46     Risk Assessment/Calculations:    CHA2DS2-VASc Score = 7   This indicates a 11.2% annual risk of stroke. The patient's score is based upon: CHF History: 1 HTN History: 1 Diabetes History: 0 Stroke History: 2 Vascular Disease History: 1 Age Score: 2 Gender Score: 0        Physical Exam:    VS:  BP 132/64 (BP Location: Right Arm)    Pulse 61    Ht 6' (1.829 m)    Wt 200 lb 6.4 oz (90.9 kg)    SpO2 96%    BMI 27.18 kg/m     Wt Readings from Last 3 Encounters:  06/01/21 200 lb 6.4 oz (90.9 kg)  05/25/21 200 lb (90.7 kg)  05/19/21 204 lb (92.5 kg)    Constitutional:      Appearance: Healthy appearance. Not in distress.  Neck:     Vascular: No JVR.   Pulmonary:     Effort: Pulmonary effort is normal.     Breath sounds: No wheezing. No rales.  Cardiovascular:     Normal rate. Regular rhythm. Normal S1. Normal S2.      Murmurs: There is a grade 1/6 systolic murmur at the URSB.  Edema:    Peripheral edema present.    Pretibial: 2+ edema of the left pretibial area and trace edema of the right pretibial area. Abdominal:     Palpations: Abdomen is soft.  Skin:    General: Skin is warm and dry.  Neurological:     General: No focal deficit present.     Mental Status: Alert and oriented to person, place and time.     Cranial Nerves: Cranial nerves are intact.         ASSESSMENT & PLAN:   Bilateral lower extremity edema Since starting on dialysis his R leg edema has improved.  He has significant edema on the L.  This is probably related to his infection and recent surgery.  However, DVT should be ruled out.  Obtain L leg venous duplex.  He is not a candidate for anticoagulation.  If he has a DVT, we will have to see if he is a candidate for IVC filter.    Chronic heart failure with preserved ejection fraction (HFpEF) (HCC) Volume seems to be improved since starting on dialysis.  His weight is down 14 lbs since Dec.  He remains on Torsemide as well.  Volume management is now per dialysis.    PAF (paroxysmal atrial fibrillation) (HCC) Maintaining normal sinus rhythm on Amiodarone.  Recent LFTs normal.  TSH was slightly elevated in Nov.  Repeat TSH, T4 today.  He is not a candidate for anticoagulation due to prior hx of bleeding.  Continue Amiodarone 200 mg once daily.   Hypertension The patient's blood pressure is controlled on his current regimen.  Continue current therapy.    Osteomyelitis of great toe of left foot (HCC) S/p L great toe amp. F/u with Dr. Sharol Given as directed.   ESRD on dialysis [facility]) He has dialysis every MWF.         Dispo:  Return in about 4 months (around 09/29/2021) for Routine Follow Up, w/ Dr. Burt Knack, or Richardson Dopp, PA-C.   Medication Adjustments/Labs and Tests Ordered: Current medicines are reviewed at length with the patient today.  Concerns regarding medicines are outlined above.  Tests Ordered: Orders Placed This Encounter  Procedures   TSH   T4, free   EKG 12-Lead   VAS [place] LOWER EXTREMITY VENOUS (DVT)   Medication Changes: No orders of the defined types were placed in this encounter.  Signed, Richardson Dopp, PA-C  06/01/2021 11:06 AM    [facility] [place], [place], [place]  [address] Phone: [phone]; Fax: [phone]

## 2021-06-01 ENCOUNTER — Ambulatory Visit (INDEPENDENT_AMBULATORY_CARE_PROVIDER_SITE_OTHER): Payer: Medicare Other | Admitting: Physician Assistant

## 2021-06-01 ENCOUNTER — Encounter: Payer: Self-pay | Admitting: Physician Assistant

## 2021-06-01 ENCOUNTER — Other Ambulatory Visit: Payer: Self-pay

## 2021-06-01 VITALS — BP 132/64 | HR 61 | Ht 72.0 in | Wt 200.4 lb

## 2021-06-01 DIAGNOSIS — I480 Paroxysmal atrial fibrillation: Secondary | ICD-10-CM

## 2021-06-01 DIAGNOSIS — I5032 Chronic diastolic (congestive) heart failure: Secondary | ICD-10-CM | POA: Diagnosis not present

## 2021-06-01 DIAGNOSIS — N186 End stage renal disease: Secondary | ICD-10-CM

## 2021-06-01 DIAGNOSIS — I10 Essential (primary) hypertension: Secondary | ICD-10-CM | POA: Diagnosis not present

## 2021-06-01 DIAGNOSIS — M7989 Other specified soft tissue disorders: Secondary | ICD-10-CM | POA: Diagnosis not present

## 2021-06-01 DIAGNOSIS — R600 Localized edema: Secondary | ICD-10-CM | POA: Diagnosis not present

## 2021-06-01 DIAGNOSIS — M869 Osteomyelitis, unspecified: Secondary | ICD-10-CM

## 2021-06-01 DIAGNOSIS — Z992 Dependence on renal dialysis: Secondary | ICD-10-CM

## 2021-06-01 LAB — TSH: TSH: 4.42 u[IU]/mL (ref 0.450–4.500)

## 2021-06-01 LAB — T4, FREE: Free T4: 2.04 ng/dL — ABNORMAL HIGH (ref 0.82–1.77)

## 2021-06-01 NOTE — Assessment & Plan Note (Signed)
Since starting on dialysis his R leg edema has improved.  He has significant edema on the L.  This is probably related to his infection and recent surgery.  However, DVT should be ruled out.  Obtain L leg venous duplex.  He is not a candidate for anticoagulation.  If he has a DVT, we will have to see if he is a candidate for IVC filter.

## 2021-06-01 NOTE — Assessment & Plan Note (Signed)
Volume seems to be improved since starting on dialysis.  His weight is down 14 lbs since Dec.  He remains on Torsemide as well.  Volume management is now per dialysis.

## 2021-06-01 NOTE — Assessment & Plan Note (Signed)
S/p L great toe amp. F/u with Dr. Sharol Given as directed.

## 2021-06-01 NOTE — Assessment & Plan Note (Signed)
The patient's blood pressure is controlled on his current regimen.  Continue current therapy.  

## 2021-06-01 NOTE — Assessment & Plan Note (Signed)
He has dialysis every MWF.

## 2021-06-01 NOTE — Patient Instructions (Signed)
Medication Instructions:   Your physician recommends that you continue on your current medications as directed. Please refer to the Current Medication list given to you today.  *If you need a refill on your cardiac medications before your next appointment, please call your pharmacy*   Lab Work:  TODAY!!!!  TSH/FREE T4  If you have labs (blood work) drawn today and your tests are completely normal, you will receive your results only by: [place] (if you have MyChart) OR A paper copy in the mail If you have any lab test that is abnormal or we need to change your treatment, we will call you to review the results.   Testing/Procedures:  Your physician has requested that you have a lower or upper extremity venous duplex. This test is an ultrasound of the veins in the legs or arms. It looks at venous blood flow that carries blood from the heart to the legs or arms. Allow one hour for a Lower Venous exam. Allow thirty minutes for an Upper Venous exam. There are no restrictions or special instructions.     Follow-Up: At [facility], you and your health needs are our priority.  As part of our continuing mission to provide you with exceptional heart care, we have created designated Provider Care Teams.  These Care Teams include your primary Cardiologist (physician) and Advanced Practice Providers (APPs -  Physician Assistants and Nurse Practitioners) who all work together to provide you with the care you need, when you need it.  We recommend signing up for the patient portal called "MyChart".  Sign up information is provided on this After Visit Summary.  MyChart is used to connect with patients for Virtual Visits (Telemedicine).  Patients are able to view lab/test results, encounter notes, upcoming appointments, etc.  Non-urgent messages can be sent to your provider as well.   To learn more about what you can do with MyChart, go to [email or web address].    Your next appointment:   4  month(s)  The format for your next appointment:   In Person  Provider:   Richardson Dopp, PA-C         Other Instructions

## 2021-06-01 NOTE — Assessment & Plan Note (Addendum)
Maintaining normal sinus rhythm on Amiodarone.  Recent LFTs normal.  TSH was slightly elevated in Nov.  Repeat TSH, T4 today.  He is not a candidate for anticoagulation due to prior hx of bleeding.  Continue Amiodarone 200 mg once daily.

## 2021-06-02 DIAGNOSIS — Z992 Dependence on renal dialysis: Secondary | ICD-10-CM | POA: Diagnosis not present

## 2021-06-02 DIAGNOSIS — N186 End stage renal disease: Secondary | ICD-10-CM | POA: Diagnosis not present

## 2021-06-02 DIAGNOSIS — N2581 Secondary hyperparathyroidism of renal origin: Secondary | ICD-10-CM | POA: Diagnosis not present

## 2021-06-02 DIAGNOSIS — Z23 Encounter for immunization: Secondary | ICD-10-CM | POA: Diagnosis not present

## 2021-06-03 ENCOUNTER — Emergency Department (HOSPITAL_COMMUNITY)
Admission: EM | Admit: 2021-06-03 | Discharge: 2021-06-04 | Disposition: A | Discharge status: Skilled Nursing Facility | Payer: Medicare Other | Source: Home / Self Care | Attending: Emergency Medicine | Admitting: Emergency Medicine

## 2021-06-03 ENCOUNTER — Other Ambulatory Visit: Payer: Self-pay

## 2021-06-03 ENCOUNTER — Emergency Department (HOSPITAL_BASED_OUTPATIENT_CLINIC_OR_DEPARTMENT_OTHER): Payer: Medicare Other

## 2021-06-03 ENCOUNTER — Emergency Department (HOSPITAL_COMMUNITY): Payer: Medicare Other

## 2021-06-03 ENCOUNTER — Ambulatory Visit (HOSPITAL_COMMUNITY): Admission: RE | Admit: 2021-06-03 | Payer: Medicare Other | Source: Ambulatory Visit

## 2021-06-03 DIAGNOSIS — I517 Cardiomegaly: Secondary | ICD-10-CM | POA: Diagnosis not present

## 2021-06-03 DIAGNOSIS — I120 Hypertensive chronic kidney disease with stage 5 chronic kidney disease or end stage renal disease: Secondary | ICD-10-CM | POA: Diagnosis not present

## 2021-06-03 DIAGNOSIS — R5383 Other fatigue: Secondary | ICD-10-CM | POA: Diagnosis not present

## 2021-06-03 DIAGNOSIS — M869 Osteomyelitis, unspecified: Secondary | ICD-10-CM | POA: Diagnosis not present

## 2021-06-03 DIAGNOSIS — F39 Unspecified mood [affective] disorder: Secondary | ICD-10-CM | POA: Diagnosis not present

## 2021-06-03 DIAGNOSIS — E860 Dehydration: Secondary | ICD-10-CM | POA: Diagnosis not present

## 2021-06-03 DIAGNOSIS — I951 Orthostatic hypotension: Secondary | ICD-10-CM | POA: Diagnosis not present

## 2021-06-03 DIAGNOSIS — R102 Pelvic and perineal pain: Secondary | ICD-10-CM | POA: Diagnosis not present

## 2021-06-03 DIAGNOSIS — K922 Gastrointestinal hemorrhage, unspecified: Secondary | ICD-10-CM | POA: Diagnosis not present

## 2021-06-03 DIAGNOSIS — N057 Unspecified nephritic syndrome with diffuse crescentic glomerulonephritis: Secondary | ICD-10-CM | POA: Diagnosis not present

## 2021-06-03 DIAGNOSIS — R55 Syncope and collapse: Secondary | ICD-10-CM | POA: Diagnosis not present

## 2021-06-03 DIAGNOSIS — Z7401 Bed confinement status: Secondary | ICD-10-CM | POA: Diagnosis not present

## 2021-06-03 DIAGNOSIS — M79662 Pain in left lower leg: Secondary | ICD-10-CM | POA: Insufficient documentation

## 2021-06-03 DIAGNOSIS — R634 Abnormal weight loss: Secondary | ICD-10-CM | POA: Diagnosis present

## 2021-06-03 DIAGNOSIS — Z89412 Acquired absence of left great toe: Secondary | ICD-10-CM | POA: Diagnosis not present

## 2021-06-03 DIAGNOSIS — R0902 Hypoxemia: Secondary | ICD-10-CM | POA: Diagnosis not present

## 2021-06-03 DIAGNOSIS — N19 Unspecified kidney failure: Secondary | ICD-10-CM | POA: Diagnosis not present

## 2021-06-03 DIAGNOSIS — M7989 Other specified soft tissue disorders: Secondary | ICD-10-CM | POA: Diagnosis not present

## 2021-06-03 DIAGNOSIS — L03116 Cellulitis of left lower limb: Secondary | ICD-10-CM | POA: Diagnosis not present

## 2021-06-03 DIAGNOSIS — K219 Gastro-esophageal reflux disease without esophagitis: Secondary | ICD-10-CM | POA: Diagnosis present

## 2021-06-03 DIAGNOSIS — S0990XA Unspecified injury of head, initial encounter: Secondary | ICD-10-CM | POA: Diagnosis not present

## 2021-06-03 DIAGNOSIS — M47812 Spondylosis without myelopathy or radiculopathy, cervical region: Secondary | ICD-10-CM | POA: Diagnosis not present

## 2021-06-03 DIAGNOSIS — J90 Pleural effusion, not elsewhere classified: Secondary | ICD-10-CM | POA: Diagnosis not present

## 2021-06-03 DIAGNOSIS — I5032 Chronic diastolic (congestive) heart failure: Secondary | ICD-10-CM | POA: Diagnosis present

## 2021-06-03 DIAGNOSIS — R630 Anorexia: Secondary | ICD-10-CM | POA: Diagnosis not present

## 2021-06-03 DIAGNOSIS — R231 Pallor: Secondary | ICD-10-CM | POA: Diagnosis not present

## 2021-06-03 DIAGNOSIS — U071 COVID-19: Secondary | ICD-10-CM | POA: Diagnosis not present

## 2021-06-03 DIAGNOSIS — F0390 Unspecified dementia without behavioral disturbance: Secondary | ICD-10-CM | POA: Diagnosis not present

## 2021-06-03 DIAGNOSIS — Z23 Encounter for immunization: Secondary | ICD-10-CM | POA: Diagnosis not present

## 2021-06-03 DIAGNOSIS — M86172 Other acute osteomyelitis, left ankle and foot: Secondary | ICD-10-CM | POA: Diagnosis not present

## 2021-06-03 DIAGNOSIS — T8149XD Infection following a procedure, other surgical site, subsequent encounter: Secondary | ICD-10-CM | POA: Diagnosis not present

## 2021-06-03 DIAGNOSIS — I132 Hypertensive heart and chronic kidney disease with heart failure and with stage 5 chronic kidney disease, or end stage renal disease: Secondary | ICD-10-CM | POA: Diagnosis present

## 2021-06-03 DIAGNOSIS — R451 Restlessness and agitation: Secondary | ICD-10-CM | POA: Diagnosis not present

## 2021-06-03 DIAGNOSIS — I10 Essential (primary) hypertension: Secondary | ICD-10-CM | POA: Diagnosis not present

## 2021-06-03 DIAGNOSIS — N390 Urinary tract infection, site not specified: Secondary | ICD-10-CM | POA: Diagnosis not present

## 2021-06-03 DIAGNOSIS — G4700 Insomnia, unspecified: Secondary | ICD-10-CM | POA: Diagnosis present

## 2021-06-03 DIAGNOSIS — Z880 Allergy status to penicillin: Secondary | ICD-10-CM | POA: Diagnosis not present

## 2021-06-03 DIAGNOSIS — I6523 Occlusion and stenosis of bilateral carotid arteries: Secondary | ICD-10-CM | POA: Diagnosis not present

## 2021-06-03 DIAGNOSIS — N186 End stage renal disease: Secondary | ICD-10-CM | POA: Diagnosis not present

## 2021-06-03 DIAGNOSIS — R42 Dizziness and giddiness: Secondary | ICD-10-CM | POA: Diagnosis not present

## 2021-06-03 DIAGNOSIS — M86272 Subacute osteomyelitis, left ankle and foot: Secondary | ICD-10-CM | POA: Diagnosis not present

## 2021-06-03 DIAGNOSIS — R600 Localized edema: Secondary | ICD-10-CM | POA: Diagnosis not present

## 2021-06-03 DIAGNOSIS — I482 Chronic atrial fibrillation, unspecified: Secondary | ICD-10-CM | POA: Diagnosis not present

## 2021-06-03 DIAGNOSIS — R531 Weakness: Secondary | ICD-10-CM | POA: Diagnosis not present

## 2021-06-03 DIAGNOSIS — M545 Low back pain, unspecified: Secondary | ICD-10-CM | POA: Diagnosis not present

## 2021-06-03 DIAGNOSIS — G4733 Obstructive sleep apnea (adult) (pediatric): Secondary | ICD-10-CM | POA: Diagnosis present

## 2021-06-03 DIAGNOSIS — J309 Allergic rhinitis, unspecified: Secondary | ICD-10-CM | POA: Diagnosis not present

## 2021-06-03 DIAGNOSIS — D638 Anemia in other chronic diseases classified elsewhere: Secondary | ICD-10-CM | POA: Diagnosis not present

## 2021-06-03 DIAGNOSIS — Z20822 Contact with and (suspected) exposure to covid-19: Secondary | ICD-10-CM | POA: Diagnosis not present

## 2021-06-03 DIAGNOSIS — R9431 Abnormal electrocardiogram [ECG] [EKG]: Secondary | ICD-10-CM | POA: Diagnosis not present

## 2021-06-03 DIAGNOSIS — I4891 Unspecified atrial fibrillation: Secondary | ICD-10-CM | POA: Diagnosis not present

## 2021-06-03 DIAGNOSIS — K7030 Alcoholic cirrhosis of liver without ascites: Secondary | ICD-10-CM | POA: Diagnosis present

## 2021-06-03 DIAGNOSIS — T8149XA Infection following a procedure, other surgical site, initial encounter: Secondary | ICD-10-CM | POA: Diagnosis not present

## 2021-06-03 DIAGNOSIS — N2581 Secondary hyperparathyroidism of renal origin: Secondary | ICD-10-CM | POA: Diagnosis not present

## 2021-06-03 DIAGNOSIS — D631 Anemia in chronic kidney disease: Secondary | ICD-10-CM | POA: Diagnosis not present

## 2021-06-03 DIAGNOSIS — Z66 Do not resuscitate: Secondary | ICD-10-CM | POA: Diagnosis present

## 2021-06-03 DIAGNOSIS — I959 Hypotension, unspecified: Secondary | ICD-10-CM | POA: Diagnosis not present

## 2021-06-03 DIAGNOSIS — R52 Pain, unspecified: Secondary | ICD-10-CM | POA: Diagnosis not present

## 2021-06-03 DIAGNOSIS — R509 Fever, unspecified: Secondary | ICD-10-CM | POA: Diagnosis not present

## 2021-06-03 DIAGNOSIS — L03115 Cellulitis of right lower limb: Secondary | ICD-10-CM | POA: Diagnosis not present

## 2021-06-03 DIAGNOSIS — Z96643 Presence of artificial hip joint, bilateral: Secondary | ICD-10-CM | POA: Diagnosis present

## 2021-06-03 DIAGNOSIS — Z992 Dependence on renal dialysis: Secondary | ICD-10-CM | POA: Insufficient documentation

## 2021-06-03 DIAGNOSIS — W1830XA Fall on same level, unspecified, initial encounter: Secondary | ICD-10-CM | POA: Diagnosis present

## 2021-06-03 DIAGNOSIS — R2689 Other abnormalities of gait and mobility: Secondary | ICD-10-CM | POA: Diagnosis not present

## 2021-06-03 DIAGNOSIS — I480 Paroxysmal atrial fibrillation: Secondary | ICD-10-CM | POA: Diagnosis not present

## 2021-06-03 DIAGNOSIS — N058 Unspecified nephritic syndrome with other morphologic changes: Secondary | ICD-10-CM | POA: Diagnosis not present

## 2021-06-03 DIAGNOSIS — W19XXXA Unspecified fall, initial encounter: Secondary | ICD-10-CM | POA: Diagnosis not present

## 2021-06-03 DIAGNOSIS — I129 Hypertensive chronic kidney disease with stage 1 through stage 4 chronic kidney disease, or unspecified chronic kidney disease: Secondary | ICD-10-CM | POA: Diagnosis not present

## 2021-06-03 DIAGNOSIS — R278 Other lack of coordination: Secondary | ICD-10-CM | POA: Diagnosis not present

## 2021-06-03 DIAGNOSIS — M549 Dorsalgia, unspecified: Secondary | ICD-10-CM | POA: Diagnosis not present

## 2021-06-03 DIAGNOSIS — Z743 Need for continuous supervision: Secondary | ICD-10-CM | POA: Diagnosis not present

## 2021-06-03 DIAGNOSIS — S199XXA Unspecified injury of neck, initial encounter: Secondary | ICD-10-CM | POA: Diagnosis not present

## 2021-06-03 DIAGNOSIS — I4819 Other persistent atrial fibrillation: Secondary | ICD-10-CM | POA: Diagnosis present

## 2021-06-03 DIAGNOSIS — M6281 Muscle weakness (generalized): Secondary | ICD-10-CM | POA: Diagnosis not present

## 2021-06-03 DIAGNOSIS — R296 Repeated falls: Secondary | ICD-10-CM | POA: Diagnosis not present

## 2021-06-03 DIAGNOSIS — N250 Renal osteodystrophy: Secondary | ICD-10-CM | POA: Diagnosis not present

## 2021-06-03 DIAGNOSIS — I672 Cerebral atherosclerosis: Secondary | ICD-10-CM | POA: Diagnosis not present

## 2021-06-03 DIAGNOSIS — G319 Degenerative disease of nervous system, unspecified: Secondary | ICD-10-CM | POA: Diagnosis not present

## 2021-06-03 DIAGNOSIS — R410 Disorientation, unspecified: Secondary | ICD-10-CM | POA: Diagnosis not present

## 2021-06-03 DIAGNOSIS — E785 Hyperlipidemia, unspecified: Secondary | ICD-10-CM | POA: Diagnosis present

## 2021-06-03 DIAGNOSIS — M8588 Other specified disorders of bone density and structure, other site: Secondary | ICD-10-CM | POA: Diagnosis not present

## 2021-06-03 DIAGNOSIS — D649 Anemia, unspecified: Secondary | ICD-10-CM | POA: Diagnosis not present

## 2021-06-03 DIAGNOSIS — L309 Dermatitis, unspecified: Secondary | ICD-10-CM | POA: Diagnosis not present

## 2021-06-03 DIAGNOSIS — E871 Hypo-osmolality and hyponatremia: Secondary | ICD-10-CM | POA: Diagnosis not present

## 2021-06-03 DIAGNOSIS — I5042 Chronic combined systolic (congestive) and diastolic (congestive) heart failure: Secondary | ICD-10-CM | POA: Diagnosis not present

## 2021-06-03 DIAGNOSIS — Y929 Unspecified place or not applicable: Secondary | ICD-10-CM | POA: Diagnosis not present

## 2021-06-03 LAB — URINALYSIS, ROUTINE W REFLEX MICROSCOPIC
Bilirubin Urine: NEGATIVE
Glucose, UA: NEGATIVE mg/dL
Ketones, ur: NEGATIVE mg/dL
Leukocytes,Ua: NEGATIVE
Nitrite: NEGATIVE
Protein, ur: 100 mg/dL — AB
RBC / HPF: >50 RBC/hpf — ABNORMAL HIGH (ref 0–5)
Specific Gravity, Urine: 1.01 (ref 1.005–1.030)
pH: 9 — ABNORMAL HIGH (ref 5.0–8.0)

## 2021-06-03 LAB — BASIC METABOLIC PANEL
Anion gap: 12 (ref 5–15)
BUN: 11 mg/dL (ref 8–23)
CO2: 29 mmol/L (ref 22–32)
Calcium: 8.5 mg/dL — ABNORMAL LOW (ref 8.9–10.3)
Chloride: 93 mmol/L — ABNORMAL LOW (ref 98–111)
Creatinine, Ser: 2.9 mg/dL — ABNORMAL HIGH (ref 0.61–1.24)
GFR, Estimated: 21 mL/min — ABNORMAL LOW (ref >60)
Glucose, Bld: 77 mg/dL (ref 70–99)
Potassium: 3.9 mmol/L (ref 3.5–5.1)
Sodium: 134 mmol/L — ABNORMAL LOW (ref 135–145)

## 2021-06-03 LAB — CBC
HCT: 31.7 % — ABNORMAL LOW (ref 39.0–52.0)
Hemoglobin: 10.2 g/dL — ABNORMAL LOW (ref 13.0–17.0)
MCH: 31.2 pg (ref 26.0–34.0)
MCHC: 32.2 g/dL (ref 30.0–36.0)
MCV: 96.9 fL (ref 80.0–100.0)
Platelets: 241 10*3/uL (ref 150–400)
RBC: 3.27 MIL/uL — ABNORMAL LOW (ref 4.22–5.81)
RDW: 14.6 % (ref 11.5–15.5)
WBC: 7.6 10*3/uL (ref 4.0–10.5)
nRBC: 0 % (ref 0.0–0.2)

## 2021-06-03 LAB — TROPONIN I (HIGH SENSITIVITY): Troponin I (High Sensitivity): 8 ng/L (ref <18)

## 2021-06-03 LAB — RESP PANEL BY RT-PCR (FLU A&B, COVID) ARPGX2
Influenza A by PCR: NEGATIVE
Influenza B by PCR: NEGATIVE
SARS Coronavirus 2 by RT PCR: POSITIVE — AB

## 2021-06-03 LAB — LACTIC ACID, PLASMA: Lactic Acid, Venous: 2.5 mmol/L (ref 0.5–1.9)

## 2021-06-03 LAB — CBG MONITORING, ED: Glucose-Capillary: 63 mg/dL — ABNORMAL LOW (ref 70–99)

## 2021-06-03 MED ORDER — MOLNUPIRAVIR EUA 200MG CAPSULE
4.0000 | ORAL_CAPSULE | Freq: Two times a day (BID) | ORAL | Status: DC
Start: 2021-06-03 — End: 2021-06-04
  Administered 2021-06-03: 800 mg via ORAL
  Filled 2021-06-03: qty 4

## 2021-06-03 MED ORDER — CARVEDILOL 3.125 MG PO TABS
6.2500 mg | ORAL_TABLET | Freq: Two times a day (BID) | ORAL | Status: DC
  Administered 2021-06-04: 6.25 mg via ORAL
  Filled 2021-06-03: qty 2

## 2021-06-03 MED ORDER — HYDRALAZINE HCL 50 MG PO TABS
50.0000 mg | ORAL_TABLET | Freq: Once | ORAL | Status: AC
  Administered 2021-06-04: 50 mg via ORAL
  Filled 2021-06-03: qty 1

## 2021-06-03 MED ORDER — MOLNUPIRAVIR EUA 200MG CAPSULE: 4.0000 | ORAL_CAPSULE | Freq: Two times a day (BID) | ORAL | 0 refills | Status: DC

## 2021-06-03 NOTE — ED Notes (Addendum)
Pt placed on monitor. Warm blanket given, Signaling device at bedside.

## 2021-06-03 NOTE — ED Provider Triage Note (Signed)
Emergency Medicine Provider Triage Evaluation Note  Darryl Diaz , a 81 y.o. male  was evaluated in triage.  Pt complains of syncope onset prior to arrival.  He notes that he was using the bathroom and he had a large bowel movement and per staff at the facility noted that he passed out.  Pt was placed on antibiotics for left big toe amputation 9 days ago. He is a dialysis patient and goes MWF and hasn't missed any sessions.  Denies chest pain, shortness of breath, Donnell pain, nausea, vomiting, fever, chills, drainage from surgical site.  Review of Systems  Positive: As per HPI above Negative: Fever, chills, drainage  Physical Exam  BP (!) 160/70 (BP Location: Right Arm)    Pulse 72    Temp 99.5 F (37.5 C) (Oral)    Resp (!) 21    SpO2 97%  Gen:   Awake, no distress   Resp:  Normal effort  MSK:   Moves extremities without difficulty  Other:  Left foot bandaged.  No obvious bloody drainage, erythema.  Trace edema noted to bilateral lower extremities.  Medical Decision Making  Medically screening exam initiated at 4:08 PM.  Appropriate orders placed.  GREYDEN BESECKER was informed that the remainder of the evaluation will be completed by another provider, this initial triage assessment does not replace that evaluation, and the importance of remaining in the ED until their evaluation is complete.     Jesslynn Kruck A, PA-C 06/03/21 1615

## 2021-06-03 NOTE — ED Notes (Signed)
Per MD second troponin not needed

## 2021-06-03 NOTE — ED Notes (Signed)
This RN tried to contact [facility] x2 - no answer

## 2021-06-03 NOTE — ED Notes (Signed)
Secretary to call [facility] for this pt

## 2021-06-03 NOTE — ED Notes (Signed)
Unsuccessful IV attempt right antecubital.

## 2021-06-03 NOTE — Discharge Instructions (Signed)
You were diagnosed with COVID today.  This is likely cause of your weakness.  I started you on antiviral medicine.  Your next dose will be due tomorrow morning.  Please take this for the full 5 days as prescribed.  Please continue drinking water as normally to keep hydrated, and follow-up with your doctor in 1 week.  You should quarantine or isolate yourself is much as possible from other people for a minimum of 7 days.  However it is important is to go to dialysis as regularly scheduled.  We were able to do your blood clot test in the emergency department today, and we do not see signs of blood clots on the ultrasound of your legs.  This was the test that you missed the appointment for today at 4pm.

## 2021-06-03 NOTE — Progress Notes (Signed)
Lower extremity venous bilateral study completed.  Preliminary results relayed to [place], MD.  See CV Proc for preliminary results report.   Conroy Goracke, RDMS, RVT  

## 2021-06-03 NOTE — ED Triage Notes (Addendum)
Pt here via [facility] from [facility] for fever and chills and generalized malaise. Pt was on antibiotics for L big toe amputation 9 days ago. 180/88, 80HR, 98% RA, 100.1 oral, CBG 84. Dialysis MWF, compliant no missed sessions

## 2021-06-03 NOTE — ED Notes (Signed)
Pt reports he is here for having a syncopal episode while using the bathroom. Pt states staff assisted him to his bed and he fell asleep and woke up alone. Pt denies any pain or symptoms.

## 2021-06-03 NOTE — ED Provider Notes (Signed)
[facility] EMERGENCY DEPARTMENT Provider Note   CSN: [id number] Arrival date & time: 06/03/21  1514     History   Darryl Diaz is a 81 y.o. male presenting from his nursing facility with concern for near syncope.  Patient reported syncopal episode in the bathroom after large bowel movement.  He does not recall loss of consciousness, but members waking up on the floor afterwards.  Patient has no acute complaints.  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  He does not feel he is been having fevers.  He denies chest pain.  He was scheduled to have a DVT ultrasound done as an outpatient at 4 PM today but missed that because he is here in the ER  Patient is on dialysis 3 days a week, Monday Wednesday Friday, is not missed any sessions.  HPI     Home Medications Prior to Admission medications   Medication Sig Start Date End Date Taking? Authorizing Provider  acetaminophen (TYLENOL) 325 MG tablet Take 2 tablets (650 mg total) by mouth every 4 (four) hours as needed for headache or mild pain. 03/10/20   Angiulli, Lavon Paganini, PA-C  amiodarone (PACERONE) 200 MG tablet TAKE 1 TABLET BY MOUTH ONCE DAILY 05/17/21   Richardson Dopp T, PA-C  atorvastatin (LIPITOR) 20 MG tablet Take 1 tablet (20 mg total) by mouth daily. 03/10/20   Angiulli, Lavon Paganini, PA-C  carvedilol (COREG) 6.25 MG tablet Take 1 tablet (6.25 mg total) by mouth 2 (two) times daily. 02/11/21   Sherren Mocha, MD  cetirizine (ZYRTEC) 10 MG tablet Take 10 mg by mouth daily.    [provider]  escitalopram (LEXAPRO) 10 MG tablet Take 1 tablet (10 mg total) by mouth daily. 03/10/20   Angiulli, Lavon Paganini, PA-C  ferrous sulfate 325 (65 FE) MG tablet Take 1 tablet (325 mg total) by mouth daily. 12/03/20 06/01/21  Aline August, MD  folic acid (FOLVITE) 427 MCG tablet Take 400 mcg by mouth daily.    [provider]   HYDROcodone-acetaminophen (NORCO/VICODIN) 5-325 MG tablet Take 1 tablet by mouth every 4 (four) hours as needed. 05/19/21   Newt Minion, MD  ketoconazole (NIZORAL) 2 % cream Apply 1 application topically daily. To dry scaly skin on feet 07/23/20   Criselda Peaches, DPM  Magnesium Oxide 400 MG CAPS Take 1 capsule (400 mg total) by mouth daily. 10/17/20   Eugenie Filler, MD  Multiple Vitamins-Minerals (CENTRUM SILVER 50+MEN) TABS Take 1 tablet by mouth daily with breakfast.    [provider]  nitroGLYCERIN (NITROSTAT) 0.4 MG SL tablet Place 1 tablet (0.4 mg total) under the tongue every 5 (five) minutes x 3 doses as needed for chest pain. 07/28/20   Baldwin Jamaica, PA-C  nystatin (MYCOSTATIN/NYSTOP) powder Apply topically 2 (two) times daily. 01/22/21   Antonieta Pert, MD  pantoprazole (PROTONIX) 40 MG tablet Take 1 tablet (40 mg total) by mouth daily. 03/10/20   Angiulli, Lavon Paganini, PA-C  potassium chloride SA (KLOR-CON) 20 MEQ tablet Take 1 tablet (20 mEq total) by mouth daily. 10/17/20 10/12/21  Eugenie Filler, MD  sodium bicarbonate 650 MG tablet Take 1 tablet (650 mg total) by mouth 2 (two) times daily. 01/22/21   Antonieta Pert, MD  torsemide 40 MG TABS Take 40 mg by mouth daily. 01/22/21   Antonieta Pert, MD  traZODone (DESYREL) 50 MG tablet Take 1  tablet (50 mg total) by mouth at bedtime. 03/10/20   Angiulli, Lavon Paganini, PA-C      Allergies    Penicillins    Review of Systems   Review of Systems  Physical Exam Updated Vital Signs BP (!) 160/70 (BP Location: Right Arm)    Pulse 72    Temp 99.5 F (37.5 C) (Oral)    Resp (!) 21    SpO2 97%  Physical Exam Constitutional:      General: He is not in acute distress. HENT:     Head: Normocephalic and atraumatic.  Eyes:     Conjunctiva/sclera: Conjunctivae normal.     Pupils: Pupils are equal, round, and reactive to light.  Cardiovascular:     Rate and Rhythm: Normal rate and regular rhythm.  Pulmonary:     Effort: Pulmonary effort is  normal. No respiratory distress.  Skin:    General: Skin is warm and dry.     Comments: Left great toe amputation site appears clean, intact, no significant erythema of the overlying foot.  Fresh bandages were applied  Neurological:     General: No focal deficit present.     Mental Status: He is alert. Mental status is at baseline.  Psychiatric:        Mood and Affect: Mood normal.        Behavior: Behavior normal.    ED Results / Procedures / Treatments   Labs (all labs ordered are listed, but only abnormal results are displayed) Labs Reviewed  BASIC METABOLIC PANEL - Abnormal; Notable for the following components:      Result Value   Sodium 134 (*)    Chloride 93 (*)    Creatinine, Ser 2.90 (*)    Calcium 8.5 (*)    GFR, Estimated 21 (*)    All other components within normal limits  CBC - Abnormal; Notable for the following components:   RBC 3.27 (*)    Hemoglobin 10.2 (*)    HCT 31.7 (*)    All other components within normal limits  URINALYSIS, ROUTINE W REFLEX MICROSCOPIC - Abnormal; Notable for the following components:   APPearance HAZY (*)    pH 9.0 (*)    Hgb urine dipstick LARGE (*)    Protein, ur 100 (*)    RBC / HPF >50 (*)    Bacteria, UA FEW (*)    All other components within normal limits  LACTIC ACID, PLASMA - Abnormal; Notable for the following components:   Lactic Acid, Venous 2.5 (*)    All other components within normal limits  CBG MONITORING, ED - Abnormal; Notable for the following components:   Glucose-Capillary 63 (*)    All other components within normal limits  LACTIC ACID, PLASMA    EKG None  Radiology CT HEAD WO CONTRAST  Result Date: 06/03/2021 CLINICAL DATA:  Dizziness, non-specific EXAM: CT HEAD WITHOUT CONTRAST TECHNIQUE: Contiguous axial images were obtained from the base of the skull through the vertex without intravenous contrast. RADIATION DOSE REDUCTION: This exam was performed according to the departmental dose-optimization  program which includes automated exposure control, adjustment of the mA and/or kV according to patient size and/or use of iterative reconstruction technique. COMPARISON:  05/01/2021 FINDINGS: Brain: There is no acute intracranial hemorrhage, mass effect, or edema. Gray-white differentiation is preserved. There is no extra-axial fluid collection. Prominence of the ventricles and sulci reflects stable parenchymal volume loss. Patchy and confluent hypodensity in the supratentorial white matter probably reflects stable chronic microvascular  ischemic changes. Vascular: There is atherosclerotic calcification at the skull base. Skull: Calvarium is unremarkable. Sinuses/Orbits: No acute finding. Other: None. IMPRESSION: No no acute intracranial abnormality. Stable chronic/nonemergent findings detailed above. Electronically Signed   By: Macy Mis M.D.   On: 06/03/2021 16:45    Procedures Procedures    Medications Ordered in ED Medications - No data to display  ED Course/ Medical Decision Making/ A&P Clinical Course as of 06/04/21 0924  Thu Jun 03, 2021  1838 DVT ultrasound negative [MT]  2030 SARS Coronavirus 2 by RT PCR(!): POSITIVE [MT]    Clinical Course User Index [MT] Wyvonnia Dusky, MD                           Medical Decision Making Amount and/or Complexity of Data Reviewed Labs: ordered. Decision-making details documented in ED Course. Radiology: ordered. ECG/medicine tests: ordered.  Risk Prescription drug management.   This patient presents to the ED with concern for syncope.  This involves an extensive number of treatment options, and is a complaint that carries with it a high risk of complications and morbidity.  The differential diagnosis includes vasovagal episode most likely given that he was on the toilet, versus arrhythmia, versus anemia, versus infection including viral illness versus other.  Patient's history is complicated by the fact that he is on dialysis, Monday  Wednesday Friday, has not missed any sessions per his report.  Additional history obtained from paramedics and EMS on patient's arrival.  External records from outside source obtained and reviewed including left great toe amputation 5 days ago, operative report reviewed  I ordered and personally interpreted labs.  The pertinent results include: White blood cell count within normal limits, hemoglobin at baseline, BMP largely unremarkable.  Potassium within normal limits.  UA with some blood, negative leukocytes and nitrites, doubt infection.  Initial lactate was 2.5, but I suspect more likely related to some dehydration than infection.  He is afebrile here.  His foot does not appear grossly infected to me.  He is still on antibiotics for post procedurally.  I ordered imaging studies including CT of the brain, chest x-ray, dvt ultrasound (per request of PCP, patient had missed this appointment today) I independently visualized and interpreted imaging which showed no acute findings; no acute DVT I agree with the radiologist interpretation  The patient was maintained on a cardiac monitor.  I personally viewed and interpreted the cardiac monitored which showed an underlying rhythm of: Sinus rhythm  Per my interpretation the patient's ECG shows normal sinus rhythm with no acute ischemia  I ordered medication including coreg, hydralazine for hypertension.  Molnupirovir for acute covid illness I have reviewed the patients home medicines and have made adjustments as needed  Test Considered:  - Doubt ACS/PE, intraabdominal infection - did not feel CT imaging indicated at this time  After the interventions noted above, I reevaluated the patient and found that they have: improved  Dispostion:  After consideration of the diagnostic results and the patients response to treatment, I feel that the patent would benefit from outpatient PCP f/u.  Lower suspicion for sepsis at this  time.         Final Clinical Impression(s) / ED Diagnoses Final diagnoses:  None    Rx / DC Orders ED Discharge Orders     None         Katriel Cutsforth, Carola Rhine, MD 06/04/21 [phone]

## 2021-06-03 NOTE — ED Notes (Signed)
ED Provider at bedside. 

## 2021-06-04 DIAGNOSIS — I10 Essential (primary) hypertension: Secondary | ICD-10-CM | POA: Diagnosis not present

## 2021-06-04 DIAGNOSIS — Z23 Encounter for immunization: Secondary | ICD-10-CM | POA: Diagnosis not present

## 2021-06-04 DIAGNOSIS — Z7401 Bed confinement status: Secondary | ICD-10-CM | POA: Diagnosis not present

## 2021-06-04 DIAGNOSIS — N186 End stage renal disease: Secondary | ICD-10-CM | POA: Diagnosis not present

## 2021-06-04 DIAGNOSIS — Z992 Dependence on renal dialysis: Secondary | ICD-10-CM | POA: Diagnosis not present

## 2021-06-04 DIAGNOSIS — I4891 Unspecified atrial fibrillation: Secondary | ICD-10-CM | POA: Diagnosis not present

## 2021-06-04 DIAGNOSIS — N2581 Secondary hyperparathyroidism of renal origin: Secondary | ICD-10-CM | POA: Diagnosis not present

## 2021-06-04 NOTE — ED Notes (Signed)
Attempted to call [place] x2 at this time without success. PTAR aware and taking pt to security first prior to going to his apartment.

## 2021-06-05 ENCOUNTER — Encounter (HOSPITAL_COMMUNITY): Payer: Self-pay

## 2021-06-05 ENCOUNTER — Emergency Department (HOSPITAL_COMMUNITY)
Admission: EM | Admit: 2021-06-05 | Discharge: 2021-06-05 | Disposition: A | Discharge status: Home / Self Care | Payer: Medicare Other | Source: Home / Self Care | Attending: Emergency Medicine | Admitting: Emergency Medicine

## 2021-06-05 ENCOUNTER — Emergency Department (HOSPITAL_COMMUNITY): Payer: Medicare Other

## 2021-06-05 ENCOUNTER — Other Ambulatory Visit: Payer: Self-pay

## 2021-06-05 DIAGNOSIS — M8588 Other specified disorders of bone density and structure, other site: Secondary | ICD-10-CM | POA: Diagnosis not present

## 2021-06-05 DIAGNOSIS — I959 Hypotension, unspecified: Secondary | ICD-10-CM | POA: Diagnosis not present

## 2021-06-05 DIAGNOSIS — R531 Weakness: Secondary | ICD-10-CM | POA: Diagnosis not present

## 2021-06-05 DIAGNOSIS — W19XXXA Unspecified fall, initial encounter: Secondary | ICD-10-CM

## 2021-06-05 DIAGNOSIS — G319 Degenerative disease of nervous system, unspecified: Secondary | ICD-10-CM | POA: Diagnosis not present

## 2021-06-05 DIAGNOSIS — R9431 Abnormal electrocardiogram [ECG] [EKG]: Secondary | ICD-10-CM | POA: Diagnosis not present

## 2021-06-05 DIAGNOSIS — Z7401 Bed confinement status: Secondary | ICD-10-CM | POA: Diagnosis not present

## 2021-06-05 DIAGNOSIS — M545 Low back pain, unspecified: Secondary | ICD-10-CM | POA: Insufficient documentation

## 2021-06-05 DIAGNOSIS — R0902 Hypoxemia: Secondary | ICD-10-CM | POA: Diagnosis not present

## 2021-06-05 DIAGNOSIS — Z8616 Personal history of COVID-19: Secondary | ICD-10-CM | POA: Insufficient documentation

## 2021-06-05 DIAGNOSIS — Z79899 Other long term (current) drug therapy: Secondary | ICD-10-CM | POA: Insufficient documentation

## 2021-06-05 DIAGNOSIS — R102 Pelvic and perineal pain: Secondary | ICD-10-CM | POA: Diagnosis not present

## 2021-06-05 DIAGNOSIS — S0990XA Unspecified injury of head, initial encounter: Secondary | ICD-10-CM | POA: Insufficient documentation

## 2021-06-05 DIAGNOSIS — M549 Dorsalgia, unspecified: Secondary | ICD-10-CM | POA: Diagnosis not present

## 2021-06-05 DIAGNOSIS — U071 COVID-19: Secondary | ICD-10-CM | POA: Diagnosis not present

## 2021-06-05 DIAGNOSIS — Z743 Need for continuous supervision: Secondary | ICD-10-CM | POA: Diagnosis not present

## 2021-06-05 LAB — CBC WITH DIFFERENTIAL/PLATELET
Abs Immature Granulocytes: 0.02 10*3/uL (ref 0.00–0.07)
Basophils Absolute: 0 10*3/uL (ref 0.0–0.1)
Basophils Relative: 0 %
Eosinophils Absolute: 0 10*3/uL (ref 0.0–0.5)
Eosinophils Relative: 0 %
HCT: 28.8 % — ABNORMAL LOW (ref 39.0–52.0)
Hemoglobin: 9.4 g/dL — ABNORMAL LOW (ref 13.0–17.0)
Immature Granulocytes: 1 %
Lymphocytes Relative: 19 %
Lymphs Abs: 0.8 10*3/uL (ref 0.7–4.0)
MCH: 31.6 pg (ref 26.0–34.0)
MCHC: 32.6 g/dL (ref 30.0–36.0)
MCV: 97 fL (ref 80.0–100.0)
Monocytes Absolute: 0.7 10*3/uL (ref 0.1–1.0)
Monocytes Relative: 17 %
Neutro Abs: 2.7 10*3/uL (ref 1.7–7.7)
Neutrophils Relative %: 63 %
Platelets: 169 10*3/uL (ref 150–400)
RBC: 2.97 MIL/uL — ABNORMAL LOW (ref 4.22–5.81)
RDW: 15.2 % (ref 11.5–15.5)
WBC: 4.2 10*3/uL (ref 4.0–10.5)
nRBC: 0 % (ref 0.0–0.2)

## 2021-06-05 LAB — BASIC METABOLIC PANEL
Anion gap: 11 (ref 5–15)
BUN: 8 mg/dL (ref 8–23)
CO2: 28 mmol/L (ref 22–32)
Calcium: 8.8 mg/dL — ABNORMAL LOW (ref 8.9–10.3)
Chloride: 96 mmol/L — ABNORMAL LOW (ref 98–111)
Creatinine, Ser: 2.74 mg/dL — ABNORMAL HIGH (ref 0.61–1.24)
GFR, Estimated: 23 mL/min — ABNORMAL LOW (ref >60)
Glucose, Bld: 84 mg/dL (ref 70–99)
Potassium: 4.2 mmol/L (ref 3.5–5.1)
Sodium: 135 mmol/L (ref 135–145)

## 2021-06-05 MED ORDER — ACETAMINOPHEN 500 MG PO TABS
1000.0000 mg | ORAL_TABLET | Freq: Once | ORAL | Status: AC
  Administered 2021-06-05: 1000 mg via ORAL
  Filled 2021-06-05: qty 2

## 2021-06-05 NOTE — ED Triage Notes (Signed)
Pt BIB [facility] from home d/t a fall. He also had a syncopal episode last night, does not recall why he fell & denies any head injury. Was able to get to phone this morning to call 911. EMS reports PVC on his EKG, A/Ox3, Hx of A-fib but on their monitor. 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 Has an abrasion on his Rt5 arm from the fall & c/o low back pain & weakness, he is Covid +. 131/58, 64 bpm, 100% on RA, CBG 113.

## 2021-06-05 NOTE — ED Notes (Signed)
PT at bedside.

## 2021-06-05 NOTE — TOC Initial Note (Addendum)
Transition of Care [facility]) - Initial/Assessment Note    Patient Details  Name: Darryl Diaz MRN: [id number] Date of Birth: [birth year]  [facility] CM/SW Contact:    Verdell Carmine, RN Phone Number: 06/05/2021, 1:40 PM  Clinical Narrative:                  Patient from [facility]. [facility] is questioning whether he is able to come back to [facility], based on his needs. Discussed with care team. He had [facility] up until January 10th. Weyman Pedro for acceptance.Received a note back from [facility] that he is no longer active with them and went to [facility]. Ccarolyn caldwell at [facility] confirmed that he is active and they will add PT OT aide nad CSW. To his RN       Patient Goals and CMS Choice        Expected Discharge Plan and Services       Post Acute Care Choice: Home Health                             HH Arranged: OT, PT, RN, Social Work, Nurse's Aide   Date [place] [facility] Contacted: 06/05/21 Time Britt: 51 Representative spoke with at [place] Arrangements/Services   Lives with:: Self                   Activities of Daily Living      Permission Sought/Granted                  Emotional Assessment              Admission diagnosis:  fall Patient Active Problem List   Diagnosis Date Noted   Dependence on renal dialysis ([place]) 05/25/2021   Non-pressure chronic ulcer of other part of left foot with unspecified severity ([place]) 05/25/2021   Osteomyelitis ([place]) 05/25/2021   Osteomyelitis of great toe of left foot ([place])    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   Cellulitis of right foot 01/17/2021   Normocytic anemia 01/17/2021   Acute lower UTI 01/17/2021   Acute GI bleeding 01/06/2021   Symptomatic anemia 12/02/2020   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   Orthostatic hypotension 11/23/2020   Acute renal insufficiency 10/23/2020   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   Hypokalemia 10/23/2020   Hypomagnesemia 10/23/2020   Low back pain at multiple sites 10/23/2020   Malnutrition of mild degree Altamease [place]: 75% to less than 90% of standard weight) ([place]) 10/23/2020   Open wound of scalp without complication 46/56/8127   Oral candidiasis 10/23/2020   Cellulitis of left foot    Cellulitis of toe of left foot 10/13/2020   Skin ulcer of left great toe ([place]) 10/13/2020   CKD (chronic kidney disease) stage 4, 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/13/2020   Chronic heart failure with preserved ejection fraction (HFpEF) ([place]) 10/13/2020   Transient weakness of left lower extremity 09/07/2020   Heme positive stool 05/14/2020   Abnormality of gait 04/27/2020   Salmonella food poisoning 51/70/0174   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   Yeast infection 03/16/2020   Diarrhea 03/16/2020   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 (HCC)    Allergies    Anemia, chronic disease    History of hypertension    Debility    Syncope and collapse 02/22/2020   AKI (acute kidney  injury) ([place]) 02/10/2020   Unspecified protein-calorie malnutrition ([place]) 02/10/2020   Weakness 02/06/2020   History of repair of hip joint 10/15/2019   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   Pain in right foot 06/27/2019   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   Bradycardia 04/20/2019   Gastrointestinal hemorrhage 02/25/2019   Fall at home, initial encounter 02/25/2019   Bilateral lower extremity edema 02/25/2019   Hyponatremia 02/25/2019   Fall 02/25/2019   Benign prostatic hyperplasia with lower urinary tract symptoms 12/19/2018   Iron deficiency anemia due to chronic blood loss 08/11/2018   Anemia 08/11/2018   Hardening of the aorta (main artery of the heart) ([place]) 08/11/2018   Benign neoplasm of colon 06/18/2018   History of adenomatous polyp of colon 08/10/2017   PAF (paroxysmal atrial fibrillation) ([place]) 02/05/2017   Impaired fasting glucose  06/10/2016   Encounter for general adult medical examination without abnormal findings 06/03/2016   Posterior dislocation of hip, closed ([place]) 04/30/2016   Hip fracture ([place]) 03/30/2016   Hypertension 03/30/2016   Hyperlipidemia 03/30/2016   Osteoarthritis 03/30/2016   Depressive disorder 03/30/2016   Rhabdomyolysis 03/30/2016   Leukocytosis 03/30/2016   Pressure ulcer 03/30/2016   Fracture of hip ([place]) 03/30/2016   Gastroesophageal reflux disease 12/17/2015   Hypertensive disorder 12/17/2015   Long term (current) use of anticoagulants 12/17/2015   Moderate major depression, 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   Pure hypercholesterolemia 12/17/2015   Seasonal allergic rhinitis 12/17/2015   CAD (coronary artery disease) 08/21/2015   PCP:  [name], MD Pharmacy:   [facility], [place] [place] [address] Phone: [phone] Fax: [phone]     Social Determinants of Health (SDOH) Interventions    Readmission Risk Interventions Readmission Risk Prevention Plan 01/18/2021 03/18/2020 03/05/2020  Transportation Screening Complete Complete Complete  PCP or Specialist Appt within 5-7 Days - - -  Home Care Screening - - -  Medication Review (RN CM) - - -  [place] or [place] - Complete Complete  Social Work Consult for [place] Planning/Counseling - Complete Complete  Palliative Care Screening - Not Applicable Not Applicable  Medication Review Press photographer) Complete Complete Complete  PCP or Specialist appointment within 3-5 days of discharge Complete - -  HRI or Home Care Consult Complete - -  SW Recovery Care/Counseling Consult Complete - -  Palliative Care Screening Not Applicable - -  Skilled Nursing Facility Complete - -  Some recent data might be hidden

## 2021-06-05 NOTE — ED Notes (Signed)
Representative Florentina Jenny Thompson Caul [phone]) called from the [facility] facility stating that pt resides in the apartment & cannot return d/t his inability to care for himself. She also states that he may can go to the Assisted living next week, since he is Covid positive that may be later than that, EDP & Social Work advised of this new info.

## 2021-06-05 NOTE — ED Notes (Signed)
Pt's sister cannot come get him & PTAR is being called at this time for pt to return to [facility].

## 2021-06-05 NOTE — Evaluation (Signed)
Physical Therapy Evaluation Patient Details Name: Darryl Diaz MRN: [id number] DOB: 06/12/1940 Today's Date: 06/05/2021  History of Present Illness  81 y.o. male presents to [facility] hospital on 06/03/2021 with concern for syncope and fall. Pt with recent L great toe amputation. Pt incidentally found to be COVID+. 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  Clinical Impression  Pt presents to PT with deficits in balance functional mobility, gait, adherence to WB precautions, and safety awareness. Pt does not require physical assistance to mobilize at this time, however he does report a history of multiple falls recently. Pt demonstrates impaired safety awareness by ambulating with his pants at his ankles to get back to bed. Pt also reports he has no WB restrictions after L great toe amputation although op note from 1/11 indicates he is to be TDWB. Pt will benefit from increased supervision at the time of discharge due to multiple falls. Pt will also benefit from HHPT to aide in improving balance and reducing falls risk. A transition to ALF would likely improve patient safety.       Recommendations for follow up therapy are one component of a multi-disciplinary discharge planning process, led by the attending physician.  Recommendations may be updated based on patient status, additional functional criteria and insurance authorization.  Follow Up Recommendations Home health PT (will benefit from ALF although pt expresses the desire to remain in independent living)    Assistance Recommended at Discharge Intermittent Supervision/Assistance  Patient can return home with the following  A little help with walking and/or transfers;A little help with bathing/dressing/bathroom;Assistance with cooking/housework;Direct supervision/assist for medications management;Direct supervision/assist for financial management;Help with stairs or ramp for  entrance;Assist for transportation    Equipment Recommendations None recommended by PT (pt owns necessary DME)  Recommendations for Other Services       Functional Status Assessment Patient has had a recent decline in their functional status and demonstrates the ability to make significant improvements in function in a reasonable and predictable amount of time.     Precautions / Restrictions Precautions Precautions: Fall Precaution Comments: orthostatic from sit to stand Required Braces or Orthoses: Other Brace Other Brace: DARCO post-op shoe Restrictions Weight Bearing Restrictions: Yes LLE Weight Bearing: Touchdown weight bearing Other Position/Activity Restrictions: pt reports no WB precautions although op note from Dr. Sharol Given on 1/11 states TDWB. Pt does report he is to utilize post-op show      Mobility  Bed Mobility Overal bed mobility: Needs Assistance Bed Mobility: Supine to Sit, Sit to Supine     Supine to sit: Supervision Sit to supine: Supervision        Transfers Overall transfer level: Needs assistance Equipment used: Rolling walker (2 wheels) Transfers: Sit to/from Stand, Bed to chair/wheelchair/BSC Sit to Stand: Min guard   Step pivot transfers: Min guard            Ambulation/Gait Ambulation/Gait assistance: Min guard Gait Distance (Feet): 30 Feet Assistive device: Rolling walker (2 wheels) Gait Pattern/deviations: Step-to pattern Gait velocity: reduced Gait velocity interpretation: <1.8 ft/sec, indicate of risk for recurrent falls   General Gait Details: pt with slowed step-to gait, does not appear to perform TDWB and is WBAT on L foot, likely has been doing this since surgery. Pt ambulates 5' from bedside commode to bed with his pants around his ankles  Stairs            Wheelchair Mobility    Modified Rankin (  Stroke Patients Only)       Balance Overall balance assessment: Needs assistance Sitting-balance support: No upper  extremity supported, Feet supported Sitting balance-Leahy Scale: Good     Standing balance support: No upper extremity supported, During functional activity Standing balance-Leahy Scale: Fair                               Pertinent Vitals/Pain Pain Assessment Pain Assessment: No/denies pain    Home Living Family/patient expects to be discharged to:: Unsure                   Additional Comments: pt has been living at [facility], per chart ILF is declining to take him back due to inability to adequately care for himself    Prior Function Prior Level of Function : History of Falls (last six months);Independent/Modified Independent             Mobility Comments: pt reports ambulating with use of rollator at home since surgery. Reports multiple falls in the last few months       Hand Dominance   Dominant Hand: Right    Extremity/Trunk Assessment   Upper Extremity Assessment Upper Extremity Assessment: Overall WFL for tasks assessed    Lower Extremity Assessment Lower Extremity Assessment: Generalized weakness    Cervical / Trunk Assessment Cervical / Trunk Assessment: Kyphotic  Communication   Communication: No difficulties  Cognition Arousal/Alertness: Awake/alert Behavior During Therapy: WFL for tasks assessed/performed Overall Cognitive Status: Impaired/Different from baseline Area of Impairment: Memory, Awareness                     Memory: Decreased recall of precautions     Awareness: Emergent            General Comments General comments (skin integrity, edema, etc.): pt with drop in systolic BP from 242 to 353 when transitioning from sitting to standing. Pt denies symptoms.    Exercises     Assessment/Plan    PT Assessment Patient needs continued PT services  PT Problem List Decreased activity tolerance;Decreased balance;Decreased mobility;Decreased knowledge of use of DME;Decreased knowledge of precautions        PT Treatment Interventions DME instruction;Gait training;Functional mobility training;Therapeutic activities;Therapeutic exercise;Balance training;Cognitive remediation;Patient/family education    PT Goals (Current goals can be found in the Care Plan section)  Acute Rehab PT Goals Patient Stated Goal: to return to ILF, stop falling PT Goal Formulation: With patient Time For Goal Achievement: 06/19/21 Potential to Achieve Goals: Fair Additional Goals Additional Goal #1: Pt will be able to independently verbalized methods to reduce the likelihood of experiencing orthostatic hypotension.    Frequency Min 3X/week     Co-evaluation               AM-PAC PT "6 Clicks" Mobility  Outcome Measure Help needed turning from your back to your side while in a flat bed without using bedrails?: A Little Help needed moving from lying on your back to sitting on the side of a flat bed without using bedrails?: A Little Help needed moving to and from a bed to a chair (including a wheelchair)?: A Little Help needed standing up from a chair using your arms (e.g., wheelchair or bedside chair)?: A Little Help needed to walk in hospital room?: A Little Help needed climbing 3-5 steps with a railing? : Total 6 Click Score: 16    End of Session  Activity Tolerance: Patient tolerated treatment well Patient left: in bed;with call bell/phone within reach Nurse Communication: Mobility status PT Visit Diagnosis: Other abnormalities of gait and mobility (R26.89);History of falling (Z91.81)    Time: 0761-5183 PT Time Calculation (min) (ACUTE ONLY): 37 min   Charges:   PT Evaluation $PT Eval Low Complexity: [place], PT, DPT Acute Rehabilitation Pager: [phone] Office [phone]   Zenaida Niece 06/05/2021, 12:20 PM

## 2021-06-05 NOTE — ED Notes (Signed)
Post conversation between EPD & pt about D/C instructions back to his Independent Living facility pt was given his AVS paperwork & is calling his sister now to come get him for D/C.

## 2021-06-05 NOTE — ED Notes (Signed)
Patient transported to X-ray 

## 2021-06-05 NOTE — ED Notes (Signed)
Pt ambulating with PT inside room to go to bathroom, pt's SpO2 stayed within normal limits 98%-100%. Pt currently using bedside toilet.

## 2021-06-05 NOTE — ED Notes (Signed)
Pt leaving with [facility] 

## 2021-06-05 NOTE — Progress Notes (Addendum)
CSW has consult for patient regarding discharge back to independent living apartment at [facility] and [place] being concerned for safety. CSW spoke with Claiborne Billings regarding case and noted concerns of COVID positive, dialysis, and wound care needs to foot. CSW discussed with MD who expressed patient is reporting feeling better and receives appropriate wound care at facility. CSW called and informed Claiborne Billings who stated she will notify Florentina Jenny but affirmed her concerns of patient's status at this time. Per Claiborne Billings they are planning on patient going over to assisted living when available but it will likely be in April and the message passed earlier about it being over the next week was misinterpreted at some point.

## 2021-06-05 NOTE — Discharge Instructions (Signed)
Follow up with your doctor in the office.  Return for repeat fall, worsening trouble breathing.

## 2021-06-05 NOTE — ED Notes (Signed)
[facility] called to transport patient  

## 2021-06-05 NOTE — ED Provider Notes (Signed)
[facility] EMERGENCY DEPARTMENT Provider Note   CSN: [id number] Arrival date & time: 06/05/21  0732     History  Chief Complaint  Patient presents with   Darryl Diaz is a 81 y.o. male.  81 yo M with a cc of a fall.  Patient was just seen 48 hours ago in the ER and was diagnosed with COVID.  He does not remember exactly what was going on but fell sometime in the night.  Is not sure how he fell.  Is complaining of some low back pain after the fall.  He is unsure if he struck his head but denies headache denies neck pain denies chest pain shortness of breath or abdominal discomfort.  He was able to ambulate after the fall.  He had up calling EMS once he was able to get up and make his way back to the phone.  He is on dialysis and had his last dialysis session yesterday.  Further history obtained by EMS.  Significant with the patient history.  C-collar placed for precaution.  The history is provided by the patient and the EMS personnel.  Fall      Home Medications Prior to Admission medications   Medication Sig Start Date End Date Taking? Authorizing Provider  acetaminophen (TYLENOL) 325 MG tablet Take 2 tablets (650 mg total) by mouth every 4 (four) hours as needed for headache or mild pain. 03/10/20   Angiulli, Lavon Paganini, PA-C  amiodarone (PACERONE) 200 MG tablet TAKE 1 TABLET BY MOUTH ONCE DAILY 05/17/21   Richardson Dopp T, PA-C  atorvastatin (LIPITOR) 20 MG tablet Take 1 tablet (20 mg total) by mouth daily. 03/10/20   Angiulli, Lavon Paganini, PA-C  carvedilol (COREG) 6.25 MG tablet Take 1 tablet (6.25 mg total) by mouth 2 (two) times daily. 02/11/21   Sherren Mocha, MD  cetirizine (ZYRTEC) 10 MG tablet Take 10 mg by mouth daily.    [provider]  escitalopram (LEXAPRO) 10 MG tablet Take 1 tablet (10 mg total) by mouth daily. 03/10/20   Angiulli, Lavon Paganini, PA-C  ferrous sulfate 325 (65 FE) MG tablet Take 1 tablet (325 mg total) by mouth daily.  12/03/20 06/01/21  Aline August, MD  folic acid (FOLVITE) 003 MCG tablet Take 400 mcg by mouth daily.    [provider]  HYDROcodone-acetaminophen (NORCO/VICODIN) 5-325 MG tablet Take 1 tablet by mouth every 4 (four) hours as needed. 05/19/21   Newt Minion, MD  ketoconazole (NIZORAL) 2 % cream Apply 1 application topically daily. To dry scaly skin on feet 07/23/20   Criselda Peaches, DPM  Magnesium Oxide 400 MG CAPS Take 1 capsule (400 mg total) by mouth daily. 10/17/20   Eugenie Filler, MD  molnupiravir EUA (LAGEVRIO) 200 mg CAPS capsule Take 4 capsules (800 mg total) by mouth 2 (two) times daily for 5 days. 06/04/21 06/09/21  Wyvonnia Dusky, MD  Multiple Vitamins-Minerals (CENTRUM SILVER 50+MEN) TABS Take 1 tablet by mouth daily with breakfast.    [provider]  nitroGLYCERIN (NITROSTAT) 0.4 MG SL tablet Place 1 tablet (0.4 mg total) under the tongue every 5 (five) minutes x 3 doses as needed for chest pain. 07/28/20   Baldwin Jamaica, PA-C  nystatin (MYCOSTATIN/NYSTOP) powder Apply topically 2 (two) times daily. 01/22/21   Antonieta Pert, MD  pantoprazole (PROTONIX) 40 MG tablet Take 1 tablet (40 mg total) by mouth daily. 03/10/20   Angiulli, Lavon Paganini, PA-C  potassium chloride SA (KLOR-CON) 20 MEQ tablet Take 1 tablet (20 mEq total) by mouth daily. 10/17/20 10/12/21  Eugenie Filler, MD  sodium bicarbonate 650 MG tablet Take 1 tablet (650 mg total) by mouth 2 (two) times daily. 01/22/21   Antonieta Pert, MD  torsemide 40 MG TABS Take 40 mg by mouth daily. 01/22/21   Antonieta Pert, MD  traZODone (DESYREL) 50 MG tablet Take 1 tablet (50 mg total) by mouth at bedtime. 03/10/20   Angiulli, Lavon Paganini, PA-C      Allergies    Penicillins    Review of Systems   Review of Systems  Physical Exam Updated Vital Signs BP (!) 131/55    Pulse (!) 113    Temp 98.4 F (36.9 C) (Oral)    Resp 17    SpO2 92%  Physical Exam Vitals and nursing note reviewed.  Constitutional:      Appearance: He  is well-developed.  HENT:     Head: Normocephalic and atraumatic.  Eyes:     Pupils: Pupils are equal, round, and reactive to light.  Neck:     Vascular: No JVD.  Cardiovascular:     Rate and Rhythm: Normal rate and regular rhythm.     Heart sounds: No murmur heard.   No friction rub. No gallop.  Pulmonary:     Effort: No respiratory distress.     Breath sounds: No wheezing.  Abdominal:     General: There is no distension.     Tenderness: There is no abdominal tenderness. There is no guarding or rebound.  Musculoskeletal:        General: Normal range of motion.     Cervical back: Normal range of motion and neck supple.     Comments: No obvious pain on palpation of the midline L-spine.  No spinal tenderness step-offs or deformities.  He is able to rotate his head 45 degrees in either direction without pain.  Palpated from head to toe without other obvious noted areas of bony tenderness.  Points to his diffuse low back as area of pain.  Skin:    Coloration: Skin is not pale.     Findings: Erythema present. No rash.     Comments: Right first digit of the lower extremity is absent.  Some skin changes that appear chronic at the base of the foot.  Some erythema that extends just above the ankle.  Left AV fistula.  Right tunneled dialysis catheter without erythema tenderness or drainage.  Neurological:     Mental Status: He is alert and oriented to person, place, and time.  Psychiatric:        Behavior: Behavior normal.    ED Results / Procedures / Treatments   Labs (all labs ordered are listed, but only abnormal results are displayed) Labs Reviewed  CBC WITH DIFFERENTIAL/PLATELET - Abnormal; Notable for the following components:      Result Value   RBC 2.97 (*)    Hemoglobin 9.4 (*)    HCT 28.8 (*)    All other components within normal limits  BASIC METABOLIC PANEL - Abnormal; Notable for the following components:   Chloride 96 (*)    Creatinine, Ser 2.74 (*)    Calcium 8.8  (*)    GFR, Estimated 23 (*)    All other components within normal limits    EKG EKG Interpretation  Date/Time:  Saturday June 05 2021 07:46:29 EST Ventricular Rate:  66 PR Interval:  166 QRS Duration: 112 QT  Interval:  548 QTC Calculation: 575 R Axis:   -24 Text Interpretation: Sinus rhythm Abnormal R-wave progression, early transition Probable RV involvement, suggest recording right precordial leads Baseline wander TECHNICALLY DIFFICULT No significant change since last tracing Confirmed by Deno Etienne [phone]) on 06/05/2021 7:58:10 AM  Radiology DG Chest 2 View  Result Date: 06/03/2021 CLINICAL DATA:  Fever, evaluate for pneumonia. EXAM: CHEST - 2 VIEW COMPARISON:  Chest x-ray 01/17/2021. FINDINGS: Right chest port catheter tip projects over the distal SVC. There are trace bilateral pleural effusions. There is no focal lung consolidation or pneumothorax. Cardiomediastinal silhouette is stable, the heart is mildly enlarged. There are healed right seventh and eighth rib fractures similar to prior. IMPRESSION: 1. Stable cardiomegaly. 2. Trace bilateral pleural effusions. 3. No focal pneumonia. Electronically Signed   By: Ronney Asters M.D.   On: 06/03/2021 20:28   DG Lumbar Spine Complete  Result Date: 06/05/2021 CLINICAL DATA:  Lower back pain and pelvic pain after fall today EXAM: LUMBAR SPINE - COMPLETE 4+ VIEW COMPARISON:  01/17/2021 FINDINGS: No acute fracture or traumatic malalignment. Diffuse degenerative disc collapse with endplate spurring and endplate sclerosis especially at L2-3, where there is also mild degenerative retrolisthesis. Degenerative facet spurring mainly at the lower levels. Generalized osteopenia. IMPRESSION: No acute finding or change from September 2022. Electronically Signed   By: Jorje Guild M.D.   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 No evidence of acute fracture or diastasis. Generalized osteopenia. Sclerotic and cystic changes at the remaining right acetabulum are stable from CT comparison. IMPRESSION: No acute finding. Electronically Signed   By: Jorje Guild M.D.   On: 06/05/2021 08:39   CT Head Wo Contrast  Result Date: 06/05/2021 CLINICAL DATA:  Head trauma, minor EXAM: CT HEAD WITHOUT CONTRAST TECHNIQUE: Contiguous axial images were obtained from the base of the skull through the vertex without intravenous contrast. RADIATION DOSE REDUCTION: This exam was performed according to the departmental dose-optimization program which includes automated exposure control, adjustment of the mA and/or kV according to patient size and/or use of iterative reconstruction technique. COMPARISON:  06/03/2021 FINDINGS: Brain: No evidence of acute infarction, hemorrhage, hydrocephalus, extra-axial collection or mass lesion/mass effect. Advanced generalized atrophy. Periventricular chronic small vessel ischemia. Vascular: No hyperdense vessel or unexpected calcification. Skull: Normal. Negative for fracture or focal lesion. Sinuses/Orbits: No acute finding. IMPRESSION: 1. No acute or reversible finding. 2. 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 Cerebral Atrophy (ICD10-G31.9). Electronically Signed   By: Jorje Guild M.D.   On: 06/05/2021 08:45   CT HEAD WO CONTRAST  Result Date: 06/03/2021 CLINICAL DATA:  Dizziness, non-specific EXAM: CT HEAD WITHOUT CONTRAST TECHNIQUE: Contiguous axial images were obtained from the base of the skull through the vertex without intravenous contrast. RADIATION DOSE REDUCTION: This exam was performed according to the departmental dose-optimization program which includes automated exposure control, adjustment of the mA and/or kV according to patient size and/or use of iterative reconstruction  technique. COMPARISON:  05/01/2021 FINDINGS: Brain: There is no acute intracranial hemorrhage, mass effect, or edema. Gray-white differentiation is preserved. There is no extra-axial fluid collection. Prominence of the ventricles and sulci reflects stable parenchymal volume loss. Patchy and confluent hypodensity in the supratentorial white matter probably reflects stable chronic microvascular ischemic changes. Vascular: There  is atherosclerotic calcification at the skull base. Skull: Calvarium is unremarkable. Sinuses/Orbits: No acute finding. Other: Darryl Diaz. IMPRESSION: No no acute intracranial abnormality. Stable chronic/nonemergent findings detailed above. Electronically Signed   By: Macy Mis M.D.   On: 06/03/2021 16:45   VAS [place] LOWER EXTREMITY VENOUS (DVT)  Result Date: 06/04/2021  Lower Venous DVT Study Patient Name:  Darryl Diaz  Date of Exam:   06/03/2021 Medical Rec #: [id number]         Accession #:    [id number] Date of Birth: 1940-12-11         Patient Gender: M Patient Age:   71 years Exam Location:  [facility] Procedure:      VAS [place] LOWER EXTREMITY VENOUS (DVT) Referring Phys: MATTHEW TRIFAN --------------------------------------------------------------------------------  Indications: Swelling, s/p LT toe amp and recent fall.  Comparison Study: No prior studies. Performing Technologist: Darlin Coco RDMS, RVT  Examination Guidelines: A complete evaluation includes B-mode imaging, spectral Doppler, color Doppler, and power Doppler as needed of all accessible portions of each vessel. Bilateral testing is considered an integral part of a complete examination. Limited examinations for reoccurring indications may be performed as noted. The reflux portion of the exam is performed with the patient in reverse Trendelenburg.  +---------+---------------+---------+-----------+----------+--------------+  RIGHT     Compressibility Phasicity Spontaneity Properties Thrombus Aging   +---------+---------------+---------+-----------+----------+--------------+  CFV       Full            Yes       Yes                                    +---------+---------------+---------+-----------+----------+--------------+  SFJ       Full                                                             +---------+---------------+---------+-----------+----------+--------------+  FV Prox   Full                                                             +---------+---------------+---------+-----------+----------+--------------+  FV Mid    Full                                                             +---------+---------------+---------+-----------+----------+--------------+  FV Distal Full                                                             +---------+---------------+---------+-----------+----------+--------------+  PFV       Full                                                             +---------+---------------+---------+-----------+----------+--------------+  POP       Full            Yes       Yes                                    +---------+---------------+---------+-----------+----------+--------------+  PTV       Full                                                             +---------+---------------+---------+-----------+----------+--------------+  PERO      Full                                                             +---------+---------------+---------+-----------+----------+--------------+  Gastroc   Full                                                             +---------+---------------+---------+-----------+----------+--------------+   +---------+---------------+---------+-----------+----------+--------------+  LEFT      Compressibility Phasicity Spontaneity Properties Thrombus Aging  +---------+---------------+---------+-----------+----------+--------------+  CFV       Full            Yes       Yes                                     +---------+---------------+---------+-----------+----------+--------------+  SFJ       Full                                                             +---------+---------------+---------+-----------+----------+--------------+  FV Prox   Full                                                             +---------+---------------+---------+-----------+----------+--------------+  FV Mid    Full                                                             +---------+---------------+---------+-----------+----------+--------------+  FV Distal Full                                                             +---------+---------------+---------+-----------+----------+--------------+  PFV       Full                                                             +---------+---------------+---------+-----------+----------+--------------+  POP       Full            Yes       Yes                                    +---------+---------------+---------+-----------+----------+--------------+  PTV       Full                                                             +---------+---------------+---------+-----------+----------+--------------+  PERO      Full                                                             +---------+---------------+---------+-----------+----------+--------------+  Gastroc   Full                                                             +---------+---------------+---------+-----------+----------+--------------+     Summary: RIGHT: - There is no evidence of deep vein thrombosis in the lower extremity.  - A cystic structure is found in the popliteal fossa.  - An avascular, heterogenous area of unknown etiology is visualized at the proximal right thigh medial to the femoral/profunda bifurcation.  LEFT: - There is no evidence of deep vein thrombosis in the lower extremity.  - No cystic structure found in the popliteal fossa.  *See table(s) above for measurements and observations. Electronically signed by Jamelle Haring on 06/04/2021 at 5:12:13 AM.    Final     Procedures Procedures    Medications Ordered in ED Medications  acetaminophen (TYLENOL) tablet 1,000 mg (1,000 mg Oral Given 06/05/21 0844)    ED Course/ Medical Decision Making/ A&P                           Medical Decision Making Amount and/or Complexity of Data Reviewed Labs: ordered. Radiology: ordered. ECG/medicine tests: ordered.  Risk OTC drugs.   Patient is a 81 y.o. male with a cc of a fall.  Patient is unsure exactly what happened.  He is complaining of low back pain primarily.  He is unsure if he has any other injury.  On exam no other obvious injury was noted.  We will obtain a CT scan of the head as he is not sure exactly what happened.  Laboratory work-up to evaluate for electrolyte abnormality or acute anemia.  Patient was seen  48 hours ago and was diagnosed with COVID.  No hypoxia here.  He has a left lower extremity wound that he feels is getting better.  He has no neck pain at all and is able to range his neck without pain do not feel that imaging of the C-spine is warranted.  Plain film of the L-spine and pelvis. Significant PMH of ESRD on dialysis MWF, Recent covid diagnosis.  I reviewed the patients chart and was seen earlier this week for paroxysmal A. fib.  Patient has been off of anticoagulation for frequent falls for about 6 months..  I independently interpreted the patients labs and imaging no significant anemia no leukocytosis renal function at baseline.  No significant electrolyte abnormality.  CT scan of the head negative for acute intracranial pathology.  My independent assessment of the patient's L-spine film without fracture.  My independent assessment of the patient's pelvis without fracture.   The facility in which the patient lives unfortunately is not willing to take him back into his current assignment.  They feel he needs more close watching.  Case was discussed with social work who ordered a stat PT consult  to try and appropriately place the patient likely back at his own facility but a higher level of care..     The facility notified us that they do not have any assisted living vacancies until at least April.  Looking back at the medical documentation I do not see any drastic difference in his functionality from 48 hours ago.  He is able to ambulate here.  I discussed the concern with the facility with the patient and he would prefer to go home at this time.  Will put him up for discharge.  PCP follow-up.  1:17 PM:  I have discussed the diagnosis/risks/treatment options with the patient.  Evaluation and diagnostic testing in the emergency department does not suggest an emergent condition requiring admission or immediate intervention beyond what has been performed at this time.  They will follow up with  PCP. We also discussed returning to the ED immediately if new or worsening sx occur. We discussed the sx which are most concerning (e.g., sudden worsening pain, fever, inability to tolerate by mouth) that necessitate immediate return. Medications administered to the patient during their visit and any new prescriptions provided to the patient are listed below.  Medications given during this visit Medications  acetaminophen (TYLENOL) tablet 1,000 mg (1,000 mg Oral Given 06/05/21 0844)     The patient appears reasonably screen and/or stabilized for discharge and I doubt any other medical condition or other [facility] requiring further screening, evaluation, or treatment in the ED at this time prior to discharge.          Final Clinical Impression(s) / ED Diagnoses Final diagnoses:  Fall, initial encounter    Rx / DC Orders ED Discharge Orders          [facility]        06/05/21 1317    Face-to-face encounter (required for Medicare/Medicaid patients)       Comments: I Cecilio Asper certify that this patient is under my care and that I, or a nurse practitioner or physician's  assistant working with me, had a face-to-face encounter that meets the physician face-to-face encounter requirements with this patient on 06/05/2021. The encounter with the patient was in whole, or in part for the following medical condition(s) which is the primary reason for home health care (List medical condition):  Patient has a history of a left great toe amputation and now is contracted COVID limiting his mobility.   06/05/21 [place], [place], DO 06/05/21 1317

## 2021-06-07 ENCOUNTER — Emergency Department (HOSPITAL_COMMUNITY): Payer: Medicare Other

## 2021-06-07 ENCOUNTER — Inpatient Hospital Stay (HOSPITAL_COMMUNITY)
Admission: EM | Admit: 2021-06-07 | Discharge: 2021-06-14 | DRG: 539 | Disposition: A | Discharge status: Skilled Nursing Facility | Payer: Medicare Other | Attending: Internal Medicine | Admitting: Internal Medicine

## 2021-06-07 ENCOUNTER — Other Ambulatory Visit: Payer: Self-pay

## 2021-06-07 ENCOUNTER — Encounter (HOSPITAL_COMMUNITY): Payer: Self-pay | Admitting: Internal Medicine

## 2021-06-07 DIAGNOSIS — Z23 Encounter for immunization: Secondary | ICD-10-CM | POA: Diagnosis present

## 2021-06-07 DIAGNOSIS — Z79899 Other long term (current) drug therapy: Secondary | ICD-10-CM

## 2021-06-07 DIAGNOSIS — I6523 Occlusion and stenosis of bilateral carotid arteries: Secondary | ICD-10-CM | POA: Diagnosis not present

## 2021-06-07 DIAGNOSIS — M7989 Other specified soft tissue disorders: Secondary | ICD-10-CM | POA: Diagnosis not present

## 2021-06-07 DIAGNOSIS — Z6825 Body mass index (BMI) 25.0-25.9, adult: Secondary | ICD-10-CM

## 2021-06-07 DIAGNOSIS — N250 Renal osteodystrophy: Secondary | ICD-10-CM | POA: Diagnosis not present

## 2021-06-07 DIAGNOSIS — L309 Dermatitis, unspecified: Secondary | ICD-10-CM | POA: Diagnosis not present

## 2021-06-07 DIAGNOSIS — N186 End stage renal disease: Secondary | ICD-10-CM | POA: Diagnosis present

## 2021-06-07 DIAGNOSIS — G4733 Obstructive sleep apnea (adult) (pediatric): Secondary | ICD-10-CM | POA: Diagnosis present

## 2021-06-07 DIAGNOSIS — I672 Cerebral atherosclerosis: Secondary | ICD-10-CM | POA: Diagnosis not present

## 2021-06-07 DIAGNOSIS — T8149XD Infection following a procedure, other surgical site, subsequent encounter: Secondary | ICD-10-CM | POA: Diagnosis not present

## 2021-06-07 DIAGNOSIS — N057 Unspecified nephritic syndrome with diffuse crescentic glomerulonephritis: Secondary | ICD-10-CM | POA: Diagnosis not present

## 2021-06-07 DIAGNOSIS — Y929 Unspecified place or not applicable: Secondary | ICD-10-CM | POA: Diagnosis not present

## 2021-06-07 DIAGNOSIS — I120 Hypertensive chronic kidney disease with stage 5 chronic kidney disease or end stage renal disease: Secondary | ICD-10-CM | POA: Diagnosis not present

## 2021-06-07 DIAGNOSIS — N2581 Secondary hyperparathyroidism of renal origin: Secondary | ICD-10-CM | POA: Diagnosis present

## 2021-06-07 DIAGNOSIS — Z89412 Acquired absence of left great toe: Secondary | ICD-10-CM | POA: Diagnosis not present

## 2021-06-07 DIAGNOSIS — M47812 Spondylosis without myelopathy or radiculopathy, cervical region: Secondary | ICD-10-CM | POA: Diagnosis not present

## 2021-06-07 DIAGNOSIS — I959 Hypotension, unspecified: Secondary | ICD-10-CM | POA: Diagnosis not present

## 2021-06-07 DIAGNOSIS — R278 Other lack of coordination: Secondary | ICD-10-CM | POA: Diagnosis not present

## 2021-06-07 DIAGNOSIS — I482 Chronic atrial fibrillation, unspecified: Secondary | ICD-10-CM | POA: Diagnosis not present

## 2021-06-07 DIAGNOSIS — J309 Allergic rhinitis, unspecified: Secondary | ICD-10-CM | POA: Diagnosis not present

## 2021-06-07 DIAGNOSIS — Z96643 Presence of artificial hip joint, bilateral: Secondary | ICD-10-CM | POA: Diagnosis present

## 2021-06-07 DIAGNOSIS — L03116 Cellulitis of left lower limb: Secondary | ICD-10-CM | POA: Diagnosis present

## 2021-06-07 DIAGNOSIS — N058 Unspecified nephritic syndrome with other morphologic changes: Secondary | ICD-10-CM | POA: Diagnosis not present

## 2021-06-07 DIAGNOSIS — Z8261 Family history of arthritis: Secondary | ICD-10-CM

## 2021-06-07 DIAGNOSIS — I129 Hypertensive chronic kidney disease with stage 1 through stage 4 chronic kidney disease, or unspecified chronic kidney disease: Secondary | ICD-10-CM | POA: Diagnosis not present

## 2021-06-07 DIAGNOSIS — M869 Osteomyelitis, unspecified: Secondary | ICD-10-CM | POA: Diagnosis present

## 2021-06-07 DIAGNOSIS — F0390 Unspecified dementia without behavioral disturbance: Secondary | ICD-10-CM | POA: Diagnosis present

## 2021-06-07 DIAGNOSIS — R531 Weakness: Secondary | ICD-10-CM | POA: Diagnosis not present

## 2021-06-07 DIAGNOSIS — I10 Essential (primary) hypertension: Secondary | ICD-10-CM | POA: Diagnosis not present

## 2021-06-07 DIAGNOSIS — N19 Unspecified kidney failure: Secondary | ICD-10-CM | POA: Diagnosis not present

## 2021-06-07 DIAGNOSIS — D631 Anemia in chronic kidney disease: Secondary | ICD-10-CM | POA: Diagnosis present

## 2021-06-07 DIAGNOSIS — G4700 Insomnia, unspecified: Secondary | ICD-10-CM | POA: Diagnosis present

## 2021-06-07 DIAGNOSIS — R296 Repeated falls: Secondary | ICD-10-CM | POA: Diagnosis present

## 2021-06-07 DIAGNOSIS — R630 Anorexia: Secondary | ICD-10-CM | POA: Diagnosis not present

## 2021-06-07 DIAGNOSIS — I951 Orthostatic hypotension: Secondary | ICD-10-CM | POA: Diagnosis not present

## 2021-06-07 DIAGNOSIS — F39 Unspecified mood [affective] disorder: Secondary | ICD-10-CM | POA: Diagnosis not present

## 2021-06-07 DIAGNOSIS — R634 Abnormal weight loss: Secondary | ICD-10-CM | POA: Diagnosis present

## 2021-06-07 DIAGNOSIS — Z823 Family history of stroke: Secondary | ICD-10-CM

## 2021-06-07 DIAGNOSIS — W19XXXA Unspecified fall, initial encounter: Secondary | ICD-10-CM

## 2021-06-07 DIAGNOSIS — Z8249 Family history of ischemic heart disease and other diseases of the circulatory system: Secondary | ICD-10-CM

## 2021-06-07 DIAGNOSIS — Z66 Do not resuscitate: Secondary | ICD-10-CM | POA: Diagnosis present

## 2021-06-07 DIAGNOSIS — K219 Gastro-esophageal reflux disease without esophagitis: Secondary | ICD-10-CM | POA: Diagnosis present

## 2021-06-07 DIAGNOSIS — K7030 Alcoholic cirrhosis of liver without ascites: Secondary | ICD-10-CM | POA: Diagnosis present

## 2021-06-07 DIAGNOSIS — K760 Fatty (change of) liver, not elsewhere classified: Secondary | ICD-10-CM | POA: Diagnosis present

## 2021-06-07 DIAGNOSIS — D638 Anemia in other chronic diseases classified elsewhere: Secondary | ICD-10-CM | POA: Diagnosis not present

## 2021-06-07 DIAGNOSIS — Z7401 Bed confinement status: Secondary | ICD-10-CM | POA: Diagnosis not present

## 2021-06-07 DIAGNOSIS — Z880 Allergy status to penicillin: Secondary | ICD-10-CM | POA: Diagnosis not present

## 2021-06-07 DIAGNOSIS — W1830XA Fall on same level, unspecified, initial encounter: Secondary | ICD-10-CM | POA: Diagnosis present

## 2021-06-07 DIAGNOSIS — U071 COVID-19: Secondary | ICD-10-CM | POA: Diagnosis present

## 2021-06-07 DIAGNOSIS — I4819 Other persistent atrial fibrillation: Secondary | ICD-10-CM | POA: Diagnosis present

## 2021-06-07 DIAGNOSIS — M6281 Muscle weakness (generalized): Secondary | ICD-10-CM | POA: Diagnosis not present

## 2021-06-07 DIAGNOSIS — Z992 Dependence on renal dialysis: Secondary | ICD-10-CM | POA: Diagnosis not present

## 2021-06-07 DIAGNOSIS — E785 Hyperlipidemia, unspecified: Secondary | ICD-10-CM | POA: Diagnosis present

## 2021-06-07 DIAGNOSIS — L03115 Cellulitis of right lower limb: Secondary | ICD-10-CM | POA: Diagnosis not present

## 2021-06-07 DIAGNOSIS — I444 Left anterior fascicular block: Secondary | ICD-10-CM | POA: Diagnosis present

## 2021-06-07 DIAGNOSIS — I480 Paroxysmal atrial fibrillation: Secondary | ICD-10-CM | POA: Diagnosis present

## 2021-06-07 DIAGNOSIS — N390 Urinary tract infection, site not specified: Secondary | ICD-10-CM | POA: Diagnosis not present

## 2021-06-07 DIAGNOSIS — R2689 Other abnormalities of gait and mobility: Secondary | ICD-10-CM | POA: Diagnosis not present

## 2021-06-07 DIAGNOSIS — R410 Disorientation, unspecified: Secondary | ICD-10-CM | POA: Diagnosis not present

## 2021-06-07 DIAGNOSIS — S0990XA Unspecified injury of head, initial encounter: Secondary | ICD-10-CM | POA: Diagnosis not present

## 2021-06-07 DIAGNOSIS — L97529 Non-pressure chronic ulcer of other part of left foot with unspecified severity: Secondary | ICD-10-CM | POA: Diagnosis present

## 2021-06-07 DIAGNOSIS — E871 Hypo-osmolality and hyponatremia: Secondary | ICD-10-CM | POA: Diagnosis not present

## 2021-06-07 DIAGNOSIS — K922 Gastrointestinal hemorrhage, unspecified: Secondary | ICD-10-CM | POA: Diagnosis not present

## 2021-06-07 DIAGNOSIS — I132 Hypertensive heart and chronic kidney disease with heart failure and with stage 5 chronic kidney disease, or end stage renal disease: Secondary | ICD-10-CM | POA: Diagnosis present

## 2021-06-07 DIAGNOSIS — I5032 Chronic diastolic (congestive) heart failure: Secondary | ICD-10-CM | POA: Diagnosis present

## 2021-06-07 DIAGNOSIS — R5383 Other fatigue: Secondary | ICD-10-CM | POA: Diagnosis not present

## 2021-06-07 DIAGNOSIS — M86172 Other acute osteomyelitis, left ankle and foot: Secondary | ICD-10-CM | POA: Diagnosis not present

## 2021-06-07 DIAGNOSIS — Z20822 Contact with and (suspected) exposure to covid-19: Secondary | ICD-10-CM | POA: Diagnosis not present

## 2021-06-07 DIAGNOSIS — M86272 Subacute osteomyelitis, left ankle and foot: Secondary | ICD-10-CM | POA: Diagnosis not present

## 2021-06-07 DIAGNOSIS — Z87891 Personal history of nicotine dependence: Secondary | ICD-10-CM

## 2021-06-07 DIAGNOSIS — D649 Anemia, unspecified: Secondary | ICD-10-CM | POA: Diagnosis not present

## 2021-06-07 DIAGNOSIS — R9431 Abnormal electrocardiogram [ECG] [EKG]: Secondary | ICD-10-CM | POA: Diagnosis not present

## 2021-06-07 DIAGNOSIS — S199XXA Unspecified injury of neck, initial encounter: Secondary | ICD-10-CM | POA: Diagnosis not present

## 2021-06-07 DIAGNOSIS — R600 Localized edema: Secondary | ICD-10-CM | POA: Diagnosis not present

## 2021-06-07 DIAGNOSIS — I5042 Chronic combined systolic (congestive) and diastolic (congestive) heart failure: Secondary | ICD-10-CM | POA: Diagnosis not present

## 2021-06-07 LAB — CBC WITH DIFFERENTIAL/PLATELET
Abs Immature Granulocytes: 0.02 10*3/uL (ref 0.00–0.07)
Basophils Absolute: 0 10*3/uL (ref 0.0–0.1)
Basophils Relative: 0 %
Eosinophils Absolute: 0 10*3/uL (ref 0.0–0.5)
Eosinophils Relative: 0 %
HCT: 28 % — ABNORMAL LOW (ref 39.0–52.0)
Hemoglobin: 9.3 g/dL — ABNORMAL LOW (ref 13.0–17.0)
Immature Granulocytes: 0 %
Lymphocytes Relative: 24 %
Lymphs Abs: 1.2 10*3/uL (ref 0.7–4.0)
MCH: 31.6 pg (ref 26.0–34.0)
MCHC: 33.2 g/dL (ref 30.0–36.0)
MCV: 95.2 fL (ref 80.0–100.0)
Monocytes Absolute: 0.5 10*3/uL (ref 0.1–1.0)
Monocytes Relative: 10 %
Neutro Abs: 3.3 10*3/uL (ref 1.7–7.7)
Neutrophils Relative %: 66 %
Platelets: 182 10*3/uL (ref 150–400)
RBC: 2.94 MIL/uL — ABNORMAL LOW (ref 4.22–5.81)
RDW: 15.6 % — ABNORMAL HIGH (ref 11.5–15.5)
WBC: 5 10*3/uL (ref 4.0–10.5)
nRBC: 0 % (ref 0.0–0.2)

## 2021-06-07 LAB — COMPREHENSIVE METABOLIC PANEL
ALT: 15 U/L (ref 0–44)
AST: 30 U/L (ref 15–41)
Albumin: 2.5 g/dL — ABNORMAL LOW (ref 3.5–5.0)
Alkaline Phosphatase: 106 U/L (ref 38–126)
Anion gap: 13 (ref 5–15)
BUN: 20 mg/dL (ref 8–23)
CO2: 24 mmol/L (ref 22–32)
Calcium: 8.5 mg/dL — ABNORMAL LOW (ref 8.9–10.3)
Chloride: 93 mmol/L — ABNORMAL LOW (ref 98–111)
Creatinine, Ser: 4.25 mg/dL — ABNORMAL HIGH (ref 0.61–1.24)
GFR, Estimated: 13 mL/min — ABNORMAL LOW (ref >60)
Glucose, Bld: 75 mg/dL (ref 70–99)
Potassium: 4.7 mmol/L (ref 3.5–5.1)
Sodium: 130 mmol/L — ABNORMAL LOW (ref 135–145)
Total Bilirubin: 0.8 mg/dL (ref 0.3–1.2)
Total Protein: 5.4 g/dL — ABNORMAL LOW (ref 6.5–8.1)

## 2021-06-07 LAB — CBG MONITORING, ED: Glucose-Capillary: 81 mg/dL (ref 70–99)

## 2021-06-07 LAB — MAGNESIUM: Magnesium: 1.9 mg/dL (ref 1.7–2.4)

## 2021-06-07 MED ORDER — SODIUM BICARBONATE 650 MG PO TABS
650.0000 mg | ORAL_TABLET | Freq: Two times a day (BID) | ORAL | Status: DC
  Administered 2021-06-08 – 2021-06-10 (×6): 650 mg via ORAL
  Filled 2021-06-07 (×7): qty 1

## 2021-06-07 MED ORDER — VANCOMYCIN VARIABLE DOSE PER UNSTABLE RENAL FUNCTION (PHARMACIST DOSING): Status: DC

## 2021-06-07 MED ORDER — LORATADINE 10 MG PO TABS
10.0000 mg | ORAL_TABLET | Freq: Every day | ORAL | Status: DC
  Administered 2021-06-08 – 2021-06-14 (×7): 10 mg via ORAL
  Filled 2021-06-07 (×7): qty 1

## 2021-06-07 MED ORDER — MOLNUPIRAVIR EUA 200MG CAPSULE
4.0000 | ORAL_CAPSULE | Freq: Two times a day (BID) | ORAL | Status: AC
  Administered 2021-06-08 – 2021-06-10 (×5): 800 mg via ORAL
  Filled 2021-06-07 (×3): qty 4

## 2021-06-07 MED ORDER — PIPERACILLIN-TAZOBACTAM 3.375 G IVPB 30 MIN: 3.3750 g | Freq: Once | INTRAVENOUS | Status: DC

## 2021-06-07 MED ORDER — CARVEDILOL 6.25 MG PO TABS
6.2500 mg | ORAL_TABLET | Freq: Two times a day (BID) | ORAL | Status: DC
  Administered 2021-06-08 – 2021-06-14 (×12): 6.25 mg via ORAL
  Filled 2021-06-07 (×11): qty 1
  Filled 2021-06-07: qty 2

## 2021-06-07 MED ORDER — PANTOPRAZOLE SODIUM 40 MG PO TBEC
40.0000 mg | DELAYED_RELEASE_TABLET | Freq: Every day | ORAL | Status: DC
  Administered 2021-06-08 – 2021-06-14 (×7): 40 mg via ORAL
  Filled 2021-06-07 (×7): qty 1

## 2021-06-07 MED ORDER — VANCOMYCIN HCL 1750 MG/350ML IV SOLN
1750.0000 mg | Freq: Once | INTRAVENOUS | Status: AC
  Administered 2021-06-07: 1750 mg via INTRAVENOUS
  Filled 2021-06-07: qty 350

## 2021-06-07 MED ORDER — ACETAMINOPHEN 650 MG RE SUPP: 650.0000 mg | Freq: Four times a day (QID) | RECTAL | Status: DC | PRN

## 2021-06-07 MED ORDER — ACETAMINOPHEN 325 MG PO TABS
650.0000 mg | ORAL_TABLET | Freq: Four times a day (QID) | ORAL | Status: DC | PRN
  Filled 2021-06-07: qty 2

## 2021-06-07 MED ORDER — AMIODARONE HCL 200 MG PO TABS
200.0000 mg | ORAL_TABLET | Freq: Every day | ORAL | Status: DC
  Administered 2021-06-08 – 2021-06-14 (×7): 200 mg via ORAL
  Filled 2021-06-07 (×7): qty 1

## 2021-06-07 MED ORDER — SODIUM CHLORIDE 0.9 % IV SOLN
2.0000 g | INTRAVENOUS | Status: DC
  Administered 2021-06-07: 2 g via INTRAVENOUS
  Filled 2021-06-07 (×2): qty 20

## 2021-06-07 MED ORDER — PIPERACILLIN-TAZOBACTAM IN DEX 2-0.25 GM/50ML IV SOLN
2.2500 g | Freq: Three times a day (TID) | INTRAVENOUS | Status: DC
  Filled 2021-06-07: qty 50

## 2021-06-07 MED ORDER — ATORVASTATIN CALCIUM 10 MG PO TABS
20.0000 mg | ORAL_TABLET | Freq: Every day | ORAL | Status: DC
  Administered 2021-06-08 – 2021-06-14 (×8): 20 mg via ORAL
  Filled 2021-06-07 (×8): qty 2

## 2021-06-07 NOTE — ED Provider Notes (Addendum)
[facility] Emergency Department Provider Note MRN:  [id number]  Arrival date & time: 06/07/21     Chief Complaint   Fall   History of Present Illness   Darryl Diaz is a 81 y.o. year-old male with a history of ESRD (MWF), GERD, HLD, OSA presenting to the ED with chief complaint of frequent falls.   Presents to emergency department after having frequent falls.  Patient resides at a memory care unit where he has fallen 3 times in the past 5 days.  Patient was seen in our emergency department recently for 1 of these falls.  Patient also tested COVID-positive.  Patient reports that he has been no acute pain at this time.  Denies headaches, neck pain, chest pain, shortness of breath, abdominal pain.  Patient is Monday Wednesday Friday dialyzer and has not dialyzed today.  Reports that he did dialyze on Friday however he is demented and is unsure what day it is.  Review of Systems  Limited secondary to dementia  Patient's Health History    Past Medical History:  Diagnosis Date   Alcohol abuse 10/93/2355   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[place]) 03/17/2020   Alcoholism ([place])    in remission following wife's death   Anemia    Ascending aorta dilation (HCC)    Atrial fibrillation (HCC)    Cardiac arrest ([place]) 04/20/2019   12 min CPR with epi   Chronic diastolic CHF (congestive heart failure) ([place])    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[place])    Mon Wed Fri   Fatty liver    Gastritis    GERD (gastroesophageal reflux disease)    H/O seasonal allergies    History of blood transfusion    History of GI bleed    Hx of adenomatous colonic polyps 08/10/2017   Hyperlipidemia    Hypertension    Insomnia    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) 02/05/2020    Recurrent syncope    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[facility])     Past Surgical History:  Procedure Laterality Date   AMPUTATION Left 05/19/2021   Procedure: LEFT GREAT TOE AMPUTATION;  Surgeon: Newt Minion, MD;  Location: [place];  Service: Orthopedics;  Laterality: Left;   ANKLE SURGERY Left 2014   had rods put in    AV FISTULA PLACEMENT Left 04/21/2021   Procedure: LEFT ARM ARTERIOVENOUS (AV) FISTULA CREATION - Left brachiocephalic fistula;  Surgeon: Marty Heck, MD;  Location: [place];  Service: Vascular;  Laterality: Left;   BIOPSY  02/28/2019   Procedure: BIOPSY;  Surgeon: Milus Banister, MD;  Location: [place];  Service: Endoscopy;;   CARDIOVERSION  02/06/2020   CARDIOVERSION N/A 02/06/2020   Procedure: CARDIOVERSION;  Surgeon: Werner Lean, MD;  Location: [place] ENDOSCOPY;  Service: Cardiovascular;  Laterality: N/A;   CARDIOVERSION N/A 02/24/2020   Procedure: CARDIOVERSION;  Surgeon: Werner Lean, MD;  Location: [place];  Service: Cardiovascular;  Laterality: N/A;   COLONOSCOPY     [place]   COLONOSCOPY WITH PROPOFOL N/A 01/08/2021   Procedure: COLONOSCOPY WITH PROPOFOL;  Surgeon: Milus Banister, MD;  Location: [facility] ENDOSCOPY;  Service: Endoscopy;  Laterality: N/A;   ESOPHAGOGASTRODUODENOSCOPY (EGD) WITH PROPOFOL N/A 02/28/2019   Procedure: ESOPHAGOGASTRODUODENOSCOPY (EGD) WITH  PROPOFOL;  Surgeon: Milus Banister, MD;  Location: [facility] ENDOSCOPY;  Service: Endoscopy;  Laterality: N/A;   HIP ARTHROPLASTY Left 04/01/2016   Procedure: ARTHROPLASTY BIPOLAR HIP (HEMIARTHROPLASTY);  Surgeon: Paralee Cancel, MD;  Location: [place];  Service: Orthopedics;  Laterality: Left;   HIP CLOSED REDUCTION Left 04/30/2016   Procedure: CLOSED REDUCTION HIP;  Surgeon: Wylene Simmer, MD;  Location: [place];  Service: Orthopedics;  Laterality: Left;   HOT HEMOSTASIS N/A 01/08/2021   Procedure: HOT HEMOSTASIS (ARGON PLASMA COAGULATION/BICAP);  Surgeon: Milus Banister, MD;  Location: [facility]  ENDOSCOPY;  Service: Endoscopy;  Laterality: N/A;   IR RADIOLOGIST EVAL & MGMT  12/31/2020   TONSILLECTOMY     TOTAL HIP ARTHROPLASTY Right 10/15/2019   Procedure: TOTAL HIP ARTHROPLASTY ANTERIOR APPROACH;  Surgeon: Paralee Cancel, MD;  Location: [facility];  Service: Orthopedics;  Laterality: Right;  70 mins    Family History  Problem Relation Age of Onset   Heart disease Mother 69   Hypertension Mother    Arthritis Mother    Heart failure Father 71   Stroke Maternal Aunt    Heart failure Sister    Colon cancer Neg Hx    Esophageal cancer Neg Hx    Stomach cancer Neg Hx    Rectal cancer Neg Hx     Social History   Socioeconomic History   Marital status: Widowed    Spouse name: Not on file   Number of children: 2   Years of education: college   Highest education level: Not on file  Occupational History   Occupation: retired  Tobacco Use   Smoking status: Former    Packs/day: 1.00    Years: 20.00    Pack years: 20.00    Types: Cigarettes   Smokeless tobacco: Never   Tobacco comments:       quit 20 years  smoked on and off for 20 years  Vaping Use   Vaping Use: Never used  Substance and Sexual Activity   Alcohol use: Not Currently    Alcohol/week: 3.0 standard drinks    Types: 3 Glasses of wine per week    Comment: h/o heavy use   Drug use: Never   Sexual activity: Not Currently  Other Topics Concern   Not on file  Social History Narrative   ** Merged History Encounter **       The patient is divorced w/ 1 son 1 daughter He is a retired Regulatory affairs officer, he lived in [place] but [employer] including windows on the world in the [place] room in [place] He moved to [place] to be near his sister who liv   es in [place] alcoholic member of AA 2 caffeinated beverages daily prior smoker no drugs   Social Determinants of Radio broadcast assistant Strain: Low Risk    Difficulty of Paying Living Expenses: Not hard at [employer]  Insecurity: No Food Insecurity   Worried About Charity fundraiser in the Last Year: Never true   Arboriculturist in the Last Year: Never true  Transportation Needs: No Transportation Needs   Lack of Transportation (Medical): No   Lack of Transportation (Non-Medical): No  Physical Activity: Not on file  Stress: No Stress Concern Present   Feeling of Stress : Not at all  Social Connections: Moderately Integrated   Frequency of Communication with Friends and Family: More than three times a week   Frequency of Social Gatherings with  Friends and Family: More than three times a week   Attends Religious Services: 1 to 4 times per year   Active Member of Clubs or Organizations: No   Attends Archivist Meetings: 1 to 4 times per year   Marital Status: Widowed  Human resources officer Violence: Not At Risk   Fear of Current or Ex-Partner: No   Emotionally Abused: No   Physically Abused: No   Sexually Abused: No     Physical Exam   Physical Exam Vitals and nursing note reviewed.  Constitutional:      Appearance: Normal appearance.  Cardiovascular:     Rate and Rhythm: Normal rate and regular rhythm.  Pulmonary:     Effort: Pulmonary effort is normal. No respiratory distress.     Breath sounds: Rales present.     Comments: Tachypnea Abdominal:     General: Abdomen is flat. There is no distension.     Palpations: Abdomen is soft.  Musculoskeletal:     Cervical back: Neck supple. No tenderness.     Right lower leg: Edema present.     Left lower leg: Edema present.  Skin:    Comments: Recent left great toe amputation.  Neurological:     General: No focal deficit present.     Mental Status: He is alert. Mental status is at baseline. He is disoriented.      Diagnostic and Interventional Summary    EKG Interpretation  Date/Time:  Monday June 07 2021 15:28:05 EST Ventricular Rate:  56 PR Interval:  186 QRS Duration: 115 QT Interval:  520 QTC Calculation: 502 R  Axis:   -56 Text Interpretation: Sinus rhythm LAD, consider left anterior fascicular block Consider anterior infarct No significant change since last tracing Confirmed by Blanchie Dessert [phone]) on 06/07/2021 3:45:59 PM       Labs Reviewed  COMPREHENSIVE METABOLIC PANEL - Abnormal; Notable for the following components:      Result Value   Sodium 130 (*)    Chloride 93 (*)    Creatinine, Ser 4.25 (*)    Calcium 8.5 (*)    Total Protein 5.4 (*)    Albumin 2.5 (*)    GFR, Estimated 13 (*)    All other components within normal limits  CBC WITH DIFFERENTIAL/PLATELET - Abnormal; Notable for the following components:   RBC 2.94 (*)    Hemoglobin 9.3 (*)    HCT 28.0 (*)    RDW 15.6 (*)    All other components within normal limits  CULTURE, BLOOD (ROUTINE X 2)  CULTURE, BLOOD (ROUTINE X 2)  MAGNESIUM  PHOSPHORUS  MAGNESIUM  COMPREHENSIVE METABOLIC PANEL  CBC WITH DIFFERENTIAL/PLATELET  C-REACTIVE PROTEIN  SEDIMENTATION RATE  METHYLMALONIC ACID, SERUM  TSH  CBG MONITORING, ED    DG Foot Complete Left  Final Result    CT Head Wo Contrast  Final Result    CT Cervical Spine Wo Contrast  Final Result    DG Chest Portable 1 View  Final Result    MR FOOT LEFT WO CONTRAST    (Results Pending)    Medications  vancomycin (VANCOREADY) IVPB 1750 mg/350 mL (1,750 mg Intravenous New Bag/Given 06/07/21 2327)  acetaminophen (TYLENOL) tablet 650 mg (has no administration in time range)    Or  acetaminophen (TYLENOL) suppository 650 mg (has no administration in time range)  vancomycin variable dose per unstable renal function (pharmacist dosing) (has no administration in time range)  cefTRIAXone (ROCEPHIN) 2 g in sodium chloride 0.9 %  100 mL IVPB (0 g Intravenous Stopped 06/07/21 2238)  amiodarone (PACERONE) tablet 200 mg (has no administration in time range)  atorvastatin (LIPITOR) tablet 20 mg (has no administration in time range)  carvedilol (COREG) tablet 6.25 mg (has no  administration in time range)  loratadine (CLARITIN) tablet 10 mg (has no administration in time range)  molnupiravir EUA (LAGEVRIO) capsule 800 mg (has no administration in time range)  pantoprazole (PROTONIX) EC tablet 40 mg (has no administration in time range)  sodium bicarbonate tablet 650 mg (has no administration in time range)     Procedures  /  Critical Care Procedures  ED Course and Medical Decision Making  Initial Impression and Ddx 81 year old male presents to emergency department after a fall.  Patient has been having frequent falls at his nursing facility.  Will obtain labs and imaging to further evaluate.  Also considering that the patient possibly needs dialysis given that he missed dialysis today.  Differential diagnosis includes but is not limited to the following: Fracture, dislocation, volume overload, intracranial injury  Past medical/surgical history that increases complexity of ED encounter:   Interpretation of Diagnostics I personally reviewed the EKG, Chest Xray, and Cardiac Monitor and my interpretation is as follows: Cardiac monitor reveals normal sinus rhythm.  EKG without peaked T waves to suggest hyperkalemia.  Chest x-ray without volume overload.    CT head was negative for acute intracranial abnormality without skull fracture.  Cervical spine was negative for cervical spinal fracture.  Chest x-ray without acute cardiopulmonary abnormalities.  Lab work-up was overall unremarkable.  I feels that the patient is stable for discharge home.  I was able to touch base with the patient's healthcare proxy who agrees with this plan.  Patient Reassessment and Ultimate Disposition/Management Will admit the patient given that he is COVID-positive and lives in an independent facility and is unable to care for himself given his frequent falls.  Patient management required discussion with the following services or consulting groups:  Hospitalist Service  Complexity of  Problems Addressed Chronic illness with severe exacerbation  Additional Data Reviewed and Analyzed Further history obtained from: Recent PCP notes and Recent Consult notes  Factors Impacting ED Encounter Risk Consideration of hospitalization    Final Clinical Impressions(s) / ED Diagnoses     ICD-10-CM   1. Fall, initial encounter  W19.Farris Has, MD 06/07/21 2352    Blanchie Dessert, MD 06/08/21 2311

## 2021-06-07 NOTE — ED Triage Notes (Signed)
Pt seen twice at this facility since Friday for eval of falls. Was found again on the floor this morning by staff. Does not remember falling. Dialysis patient, missed dialysis today d/t being too weak to go. Pt also has wound on toe. Covid+ on Friday, is in there ALF side of [facility] and that does not have an isolation wing, so he was sent back here for admission.

## 2021-06-07 NOTE — ED Provider Notes (Incomplete)
Patient is a 81 year old male with multiple medical problems and based on evaluation of external medical records has been seen multiple times for falls.  Patient is COVID-positive as of Friday but there was report from his nursing facility which was an outside information source that he was found on the floor again today and missed dialysis.  Patient has recently had left great toe amputation but based on exam does not appear to be infected at this time.  He does have distal edema in bilateral legs of unknown length of time.  He is awake and alert and does not appear to be in any acute distress at this time.  Given patient does appear fluid overload and recently missed dialysis with a last normal dialysis on Friday we will check labs for further evaluation.  He does not appear to have any respiratory symptoms at this time associated with COVID status.

## 2021-06-07 NOTE — ED Notes (Signed)
Pt placed on 2L O2 via [place] because saturations dropping to 88-89% on RA

## 2021-06-07 NOTE — Progress Notes (Signed)
Pharmacy Antibiotic Note  Darryl Diaz is a 81 y.o. male admitted on 06/07/2021 with  Osteomyelitis .  Pharmacy has been consulted for Vancomycin dosing.  ESRD on iHD MWF  Plan: Vancomycin 1750mg  LD x1 followed by intermittent dosing until iHD schedule determined Plan to obtain levels at steady state  Will monitor for acute changes in renal function and adjust as needed F/u cultures results and de-escalate as appropriate     Temp (24hrs), Avg:97.5 F (36.4 C), Min:97.5 F (36.4 C), Max:97.5 F (36.4 C)  Recent Labs  Lab 06/03/21 1602 06/05/21 0741 06/07/21 1530  WBC 7.6 4.2 5.0  CREATININE 2.90* 2.74* 4.25*  LATICACIDVEN 2.5*  --   --     Estimated Creatinine Clearance: 15.5 mL/min (A) (by C-G formula based on SCr of 4.25 mg/dL (H)).    Allergies  Allergen Reactions   Penicillins Rash   Thank you for allowing pharmacy to be a part of this patients care.  Donnald Garre, PharmD Clinical Pharmacist  Please check AMION for all [facility] numbers After 10:00 PM, call [place] [phone]

## 2021-06-07 NOTE — H&P (Signed)
History and Physical    PLEASE NOTE THAT [employer]AGON [employer] SOFTWARE WAS USED IN THE CONSTRUCTION OF THIS NOTE.   JAAZIEL PEATROSS ZLD:[id number] DOB: 02/18/1941 DOA: 06/07/2021  PCP: [name], MD  Patient coming from: home   I have personally briefly reviewed patient's old medical records in [place]  Chief Complaint: Recurrent falls  HPI: Darryl Diaz is a 81 y.o. male with medical history significant for end-stage renal disease on hemodialysis on Monday, Wednesday, Friday schedule, paroxysmal atrial fibrillation, hypertension, anemia of chronic kidney disease with associated baseline hemoglobin 8-11,  who is admitted to [facility] on 06/07/2021 with concern for osteomyelitis of the left second toe after presenting from home to [facility] ED complaining of frequent falls.   Patient with history of osteomyelitis involving the left great toe, status post amputation of the left great toe via Dr. Sharol Given of Orthopedic surgery on 05/19/2021.  Over the last 4 to 5 days, the patient reports multiple ground-level falls, in which he states he " loses his balance" as the underlying contributing factor.  States that this is resulted in 2-3 ground-level falls over that time, without any associated loss of consciousness.  He has not hit his head as a component of these falls.  Denies any associated dizziness, lightheadedness, or subjective sensation of ensuing loss of consciousness, or any loss of consciousness proper.  Not associate with any chest pain, shortness of breath, palpitations, diaphoresis, nausea, vomiting.  Denies any headache, neck stiffness, new neck pain.   Refer such for course patient was evaluated at [facility] emergency department on 06/03/2021, with benign evaluation formed at that time, with the exception of screening COVID-19 PCR found to be incidentally positive, prompting initiation 5-day course of Molnupirvavir.  Patient denies any preceding or insulin rhinitis,  rhinorrhea, sore throat, cough, shortness of breath.  Continues to deny any recent subjective fever, chills, rigors, or generalized myalgias.  Ultimately, the patient was discharged back to his living facility from the ED on 06/03/2021, before experiencing another ground-level fall earlier today, in which he, once again, experienced loss of balance,, prompting him to be brought back to [facility] emergency department for further evaluation and management thereof.  He reports that, today's ground-level fall resulted in him falling into a pile of his dirty clothes on the floor, without any loss of consciousness, with patient remaining that he also did not hit his head as a component of this fall.  Following aforementioned recent amputation of the left first toe by Dr. Sharol Given, the patient denies any recent worsening of discomfort as relates to the left foot, nor any new drainage from the site.  He does however report 3 to 4 days of generalized weakness in the absence of any associated acute focal weakness, acute focal numbness, paresthesias, facial droop, slurred speech, expressive aphasia, acute change in vision, dysphagia, vertigo.  Medical history notable for end-stage renal disease on hemodialysis on Monday, Wednesday, Friday schedule.  As he was in the emergency department today, he conveys that he missed his routine hemodialysis session that was scheduled for earlier today.  He also notes that his most recent prior hemodialysis session occurred on Thursday, 06/03/2021, 1 day earlier than routinely scheduled as the patient had outpatient follow-up scheduled for 06/04/2021 prompting him to reschedule his hemodialysis for the aforementioned Thursday as opposed to his routinely scheduled Friday session.     ED Course:  Vital signs in the ED were notable for the following: Afebrile; heart  rate 56 to 66; blood pressure 149/81 - 163/58; respiratory rate 17-22, oxygen saturation 96% on room air.  Labs were notable  for the following: CMP notable for the following: Sodium 130, potassium 4.7, bicarbonate 24, anion gap 13.  CBC notable for will 5000, hemoglobin #3 compared to most recent prior value of 9.4 on 06/05/2021.  Imaging and additional notable ED work-up: EKG shows sinus rhythm with heart rate 56, left anterior fascicular block, and evidence of T wave or ST changes, including no evidence of ST elevation.  Chest x-ray showed no evidence of acute cardiopulmonary process, including evidence of infiltrate, edema, effusion, or pneumothorax.  CT head showed no evidence of acute process, including no evidence of intracranial hemorrhage or any evidence of skull fracture.  CT cervical spine showed no evidence of cervical spine fracture or subluxation.  Plain films of the left foot, in comparison to most recent prior plain films from 05/13/2021, showed interval amputation at the first metatarsal phalangeal joint, as well as interval development of bony destruction at the base of the second proximal phalanx concerning for osteomyelitis, while noting progressive soft tissue swelling throughout the left forefoot.  EDP discussed the patient's case with the on-call nephrologist to arrange for hemodialysis tomorrow morning (06/08/2021).  While in the ED, the following were administered: IV vancomycin  Subsequently, the patient was admitted for further evaluation and management of suspected left second toe osteomyelitis in the setting of presenting frequent falls that appear more mechanical in nature in setting of loss of balance following recent amputation of left great toe, and complicated by generalized weakness.      Review of Systems: As per HPI otherwise 10 point review of systems negative.   Past Medical History:  Diagnosis Date   Alcohol abuse 32/95/1884   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[place]) 03/17/2020   Alcoholism ([place])    in remission following wife's death   Anemia    Ascending aorta dilation  (HCC)    Atrial fibrillation (HCC)    Cardiac arrest ([place]) 04/20/2019   12 min CPR with epi   Chronic diastolic CHF (congestive heart failure) (HCC)    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[place])    Mon Wed Fri   Fatty liver    Gastritis    GERD (gastroesophageal reflux disease)    H/O seasonal allergies    History of blood transfusion    History of GI bleed    Hx of adenomatous colonic polyps 08/10/2017   Hyperlipidemia    Hypertension    Insomnia    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   Mallory-Weiss tear    OA (osteoarthritis)    hands   OSA (obstructive sleep apnea)    No longer uses CPAP   Persistent atrial fibrillation with rapid ventricular response ([place]) 02/05/2020   Recurrent syncope    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)     Past Surgical History:  Procedure Laterality Date   AMPUTATION Left 05/19/2021   Procedure: LEFT GREAT TOE AMPUTATION;  Surgeon: Newt Minion, MD;  Location: [place];  Service: Orthopedics;  Laterality: Left;   ANKLE SURGERY Left 2014   had rods put in    AV FISTULA PLACEMENT Left 04/21/2021   Procedure: LEFT ARM ARTERIOVENOUS (AV) FISTULA CREATION - Left brachiocephalic fistula;  Surgeon: Marty Heck, MD;  Location: [place];  Service: Vascular;  Laterality: Left;   BIOPSY  02/28/2019   Procedure: BIOPSY;  Surgeon: Milus Banister, MD;  Location: [facility] ENDOSCOPY;  Service: Endoscopy;;   CARDIOVERSION  02/06/2020   CARDIOVERSION N/A 02/06/2020   Procedure: CARDIOVERSION;  Surgeon: Werner Lean, MD;  Location: [place];  Service: Cardiovascular;  Laterality: N/A;   CARDIOVERSION N/A 02/24/2020   Procedure: CARDIOVERSION;  Surgeon: Werner Lean, MD;  Location: [place];  Service: Cardiovascular;  Laterality: N/A;   COLONOSCOPY     [place]   COLONOSCOPY WITH PROPOFOL N/A 01/08/2021   Procedure: COLONOSCOPY WITH PROPOFOL;  Surgeon: Milus Banister, MD;  Location: [facility] ENDOSCOPY;  Service: Endoscopy;   Laterality: N/A;   ESOPHAGOGASTRODUODENOSCOPY (EGD) WITH PROPOFOL N/A 02/28/2019   Procedure: ESOPHAGOGASTRODUODENOSCOPY (EGD) WITH PROPOFOL;  Surgeon: Milus Banister, MD;  Location: [facility] ENDOSCOPY;  Service: Endoscopy;  Laterality: N/A;   HIP ARTHROPLASTY Left 04/01/2016   Procedure: ARTHROPLASTY BIPOLAR HIP (HEMIARTHROPLASTY);  Surgeon: Paralee Cancel, MD;  Location: [place];  Service: Orthopedics;  Laterality: Left;   HIP CLOSED REDUCTION Left 04/30/2016   Procedure: CLOSED REDUCTION HIP;  Surgeon: Wylene Simmer, MD;  Location: [place];  Service: Orthopedics;  Laterality: Left;   HOT HEMOSTASIS N/A 01/08/2021   Procedure: HOT HEMOSTASIS (ARGON PLASMA COAGULATION/BICAP);  Surgeon: Milus Banister, MD;  Location: [facility] ENDOSCOPY;  Service: Endoscopy;  Laterality: N/A;   IR RADIOLOGIST EVAL & MGMT  12/31/2020   TONSILLECTOMY     TOTAL HIP ARTHROPLASTY Right 10/15/2019   Procedure: TOTAL HIP ARTHROPLASTY ANTERIOR APPROACH;  Surgeon: Paralee Cancel, MD;  Location: [facility];  Service: Orthopedics;  Laterality: Right;  70 mins    Social History:  reports that he has quit smoking. His smoking use included cigarettes. He has a 20.00 pack-year smoking history. He has never used smokeless tobacco. He reports that he does not currently use alcohol after a past usage of about 3.0 standard drinks per week. He reports that he does not use drugs.   Allergies  Allergen Reactions   Penicillins Rash    Family History  Problem Relation Age of Onset   Heart disease Mother 62   Hypertension Mother    Arthritis Mother    Heart failure Father 83   Stroke Maternal Aunt    Heart failure Sister    Colon cancer Neg Hx    Esophageal cancer Neg Hx    Stomach cancer Neg Hx    Rectal cancer Neg Hx     Family history reviewed and not pertinent    Prior to Admission medications   Medication Sig Start Date End Date Taking? Authorizing Provider  acetaminophen (TYLENOL) 325 MG tablet Take 2 tablets (650 mg total) by mouth  every 4 (four) hours as needed for headache or mild pain. 03/10/20   Angiulli, Lavon Paganini, PA-C  amiodarone (PACERONE) 200 MG tablet TAKE 1 TABLET BY MOUTH ONCE DAILY 05/17/21   Richardson Dopp T, PA-C  atorvastatin (LIPITOR) 20 MG tablet Take 1 tablet (20 mg total) by mouth daily. 03/10/20   Angiulli, Lavon Paganini, PA-C  carvedilol (COREG) 6.25 MG tablet Take 1 tablet (6.25 mg total) by mouth 2 (two) times daily. 02/11/21   Sherren Mocha, MD  cetirizine (ZYRTEC) 10 MG tablet Take 10 mg by mouth daily.    [provider]  escitalopram (LEXAPRO) 10 MG tablet Take 1 tablet (10 mg total) by mouth daily. 03/10/20   Angiulli, Lavon Paganini, PA-C  ferrous sulfate 325 (65 FE) MG tablet Take 1 tablet (325 mg total) by mouth daily. 12/03/20 06/01/21  Aline August, MD  folic acid (FOLVITE) 683 MCG tablet  Take 400 mcg by mouth daily.    [provider]  HYDROcodone-acetaminophen (NORCO/VICODIN) 5-325 MG tablet Take 1 tablet by mouth every 4 (four) hours as needed. 05/19/21   Newt Minion, MD  ketoconazole (NIZORAL) 2 % cream Apply 1 application topically daily. To dry scaly skin on feet 07/23/20   Criselda Peaches, DPM  Magnesium Oxide 400 MG CAPS Take 1 capsule (400 mg total) by mouth daily. 10/17/20   Eugenie Filler, MD  molnupiravir EUA (LAGEVRIO) 200 mg CAPS capsule Take 4 capsules (800 mg total) by mouth 2 (two) times daily for 5 days. 06/04/21 06/09/21  Wyvonnia Dusky, MD  Multiple Vitamins-Minerals (CENTRUM SILVER 50+MEN) TABS Take 1 tablet by mouth daily with breakfast.    [provider]  nitroGLYCERIN (NITROSTAT) 0.4 MG SL tablet Place 1 tablet (0.4 mg total) under the tongue every 5 (five) minutes x 3 doses as needed for chest pain. 07/28/20   Baldwin Jamaica, PA-C  nystatin (MYCOSTATIN/NYSTOP) powder Apply topically 2 (two) times daily. 01/22/21   Antonieta Pert, MD  pantoprazole (PROTONIX) 40 MG tablet Take 1 tablet (40 mg total) by mouth daily. 03/10/20   Angiulli, Lavon Paganini, PA-C   potassium chloride SA (KLOR-CON) 20 MEQ tablet Take 1 tablet (20 mEq total) by mouth daily. 10/17/20 10/12/21  Eugenie Filler, MD  sodium bicarbonate 650 MG tablet Take 1 tablet (650 mg total) by mouth 2 (two) times daily. 01/22/21   Antonieta Pert, MD  torsemide 40 MG TABS Take 40 mg by mouth daily. 01/22/21   Antonieta Pert, MD  traZODone (DESYREL) 50 MG tablet Take 1 tablet (50 mg total) by mouth at bedtime. 03/10/20   Cathlyn Parsons, PA-C     Objective    Physical Exam: Vitals:   06/07/21 1715 06/07/21 1815 06/07/21 1830 06/07/21 1900  BP: (!) 155/65 (!) 163/68 (!) 170/69 (!) 149/81  Pulse: 62 64 68 65  Resp: 18 19 (!) 22 (!) 22  Temp:      TempSrc:      SpO2: 100% 99% 100% 96%    General: appears to be stated age; alert, oriented Skin: warm, dry Head:  AT/[place] Mouth:  Oral mucosa membranes appear moist, normal dentition Neck: supple; trachea midline Heart:  RRR; did not appreciate any M/R/G Lungs: CTAB, did not appreciate any wheezes, rales, or rhonchi Abdomen: + BS; soft, ND, NT Vascular: 2+ pedal pulses b/l; 2+ radial pulses b/l Extremities: Status post amputation of left great toe noted, with sutures in place, no muscle wasting Neuro: strength and sensation intact in upper and lower extremities b/l  Labs on Admission: I have personally reviewed following labs and imaging studies  CBC: Recent Labs  Lab 06/03/21 1602 06/05/21 0741 06/07/21 1530  WBC 7.6 4.2 5.0  NEUTROABS  --  2.7 3.3  HGB 10.2* 9.4* 9.3*  HCT 31.7* 28.8* 28.0*  MCV 96.9 97.0 95.2  PLT 241 169 840   Basic Metabolic Panel: Recent Labs  Lab 06/03/21 1602 06/05/21 0741 06/07/21 1530  NA 134* 135 130*  K 3.9 4.2 4.7  CL 93* 96* 93*  CO2 _0 GLUCOSE 77 84 75  BUN _1 CREATININE 2.90* 2.74* 4.25*  CALCIUM 8.5* 8.8* 8.5*  MG  --   --  1.9   GFR: Estimated Creatinine Clearance: 15.5 mL/min (A) (by C-G formula based on SCr of 4.25 mg/dL (H)). Liver Function Tests: Recent Labs  Lab  06/07/21 1530  AST  30  ALT 15  ALKPHOS 106  BILITOT 0.8  PROT 5.4*  ALBUMIN 2.5*   No results for input(s): LIPASE, AMYLASE in the last 168 hours. No results for input(s): AMMONIA in the last 168 hours. Coagulation Profile: No results for input(s): INR, PROTIME in the last 168 hours. Cardiac Enzymes: No results for input(s): CKTOTAL, CKMB, CKMBINDEX, TROPONINI in the last 168 hours. BNP (last 3 results) No results for input(s): PROBNP in the last 8760 hours. HbA1C: No results for input(s): HGBA1C in the last 72 hours. CBG: Recent Labs  Lab 06/03/21 1602 06/07/21 1526  GLUCAP 63* 81   Lipid Profile: No results for input(s): CHOL, HDL, LDLCALC, TRIG, CHOLHDL, LDLDIRECT in the last 72 hours. Thyroid Function Tests: No results for input(s): TSH, T4TOTAL, FREET4, T3FREE, THYROIDAB in the last 72 hours. Anemia Panel: No results for input(s): VITAMINB12, FOLATE, FERRITIN, TIBC, IRON, RETICCTPCT in the last 72 hours. Urine analysis:    Component Value Date/Time   COLORURINE YELLOW 06/03/2021 1703   APPEARANCEUR HAZY (A) 06/03/2021 1703   LABSPEC 1.010 06/03/2021 1703   PHURINE 9.0 (H) 06/03/2021 1703   GLUCOSEU NEGATIVE 06/03/2021 1703   HGBUR LARGE (A) 06/03/2021 1703   BILIRUBINUR NEGATIVE 06/03/2021 1703   KETONESUR NEGATIVE 06/03/2021 1703   PROTEINUR 100 (A) 06/03/2021 1703   NITRITE NEGATIVE 06/03/2021 1703   LEUKOCYTESUR NEGATIVE 06/03/2021 1703    Radiological Exams on Admission: CT Head Wo Contrast  Result Date: 06/07/2021 CLINICAL DATA:  [place] today.  Possibly hit his head. EXAM: CT HEAD WITHOUT CONTRAST CT CERVICAL SPINE WITHOUT CONTRAST TECHNIQUE: Multidetector CT imaging of the head and cervical spine was performed following the standard protocol without intravenous contrast. Multiplanar CT image reconstructions of the cervical spine were also generated. RADIATION DOSE REDUCTION: This exam was performed according to the departmental dose-optimization program  which includes automated exposure control, adjustment of the mA and/or kV according to patient size and/or use of iterative reconstruction technique. COMPARISON:  Head CT dated 06/03/2021. Head and cervical spine CT dated 05/01/2021. FINDINGS: CT HEAD FINDINGS Brain: No significant change in moderate to marked diffuse enlargement of the ventricles and subarachnoid spaces. Mild-to-moderate patchy white matter low density in both cerebral hemispheres without significant change. No intracranial hemorrhage, mass lesion or CT evidence of acute infarction. Vascular: No hyperdense vessel or unexpected calcification. Atheromatous arterial calcifications at the skull base. Skull: Normal. Negative for fracture or focal lesion. Sinuses/Orbits: Status post right cataract extraction. Bilateral anterior ethmoid sinus mucosal thickening. Mild left maxillary sinus mucosal thickening. Other: None. CT CERVICAL SPINE FINDINGS Alignment: Stable reversal of the normal cervical lordosis. No subluxations. Skull base and vertebrae: No acute fracture. No primary bone lesion or focal pathologic process. Soft tissues and spinal canal: No prevertebral fluid or swelling. No visible canal hematoma. Disc levels:  Stable multilevel degenerative changes. Upper chest: Clear lung apices. Other: Bilateral carotid artery calcifications. IMPRESSION: 1. No skull fracture or intracranial hemorrhage. 2. No cervical spine fracture or subluxation. 3. Stable diffuse cerebral and cerebellar atrophy in chronic small vessel white matter ischemic changes. 4. Stable reversal of the normal cervical lordosis and multilevel cervical spine degenerative changes. 5. Bilateral carotid artery atheromatous calcifications. Electronically Signed   By: Claudie Revering M.D.   On: 06/07/2021 16:38   CT Cervical Spine Wo Contrast  Result Date: 06/07/2021 CLINICAL DATA:  [place] today.  Possibly hit his head. EXAM: CT HEAD WITHOUT CONTRAST CT CERVICAL SPINE WITHOUT CONTRAST  TECHNIQUE: Multidetector CT imaging of the head and cervical  spine was performed following the standard protocol without intravenous contrast. Multiplanar CT image reconstructions of the cervical spine were also generated. RADIATION DOSE REDUCTION: This exam was performed according to the departmental dose-optimization program which includes automated exposure control, adjustment of the mA and/or kV according to patient size and/or use of iterative reconstruction technique. COMPARISON:  Head CT dated 06/03/2021. Head and cervical spine CT dated 05/01/2021. FINDINGS: CT HEAD FINDINGS Brain: No significant change in moderate to marked diffuse enlargement of the ventricles and subarachnoid spaces. Mild-to-moderate patchy white matter low density in both cerebral hemispheres without significant change. No intracranial hemorrhage, mass lesion or CT evidence of acute infarction. Vascular: No hyperdense vessel or unexpected calcification. Atheromatous arterial calcifications at the skull base. Skull: Normal. Negative for fracture or focal lesion. Sinuses/Orbits: Status post right cataract extraction. Bilateral anterior ethmoid sinus mucosal thickening. Mild left maxillary sinus mucosal thickening. Other: None. CT CERVICAL SPINE FINDINGS Alignment: Stable reversal of the normal cervical lordosis. No subluxations. Skull base and vertebrae: No acute fracture. No primary bone lesion or focal pathologic process. Soft tissues and spinal canal: No prevertebral fluid or swelling. No visible canal hematoma. Disc levels:  Stable multilevel degenerative changes. Upper chest: Clear lung apices. Other: Bilateral carotid artery calcifications. IMPRESSION: 1. No skull fracture or intracranial hemorrhage. 2. No cervical spine fracture or subluxation. 3. Stable diffuse cerebral and cerebellar atrophy in chronic small vessel white matter ischemic changes. 4. Stable reversal of the normal cervical lordosis and multilevel cervical spine  degenerative changes. 5. Bilateral carotid artery atheromatous calcifications. Electronically Signed   By: Claudie Revering M.D.   On: 06/07/2021 16:38   DG Chest Portable 1 View  Result Date: 06/07/2021 CLINICAL DATA:  Concern for pneumonia EXAM: PORTABLE CHEST 1 VIEW COMPARISON:  Chest x-ray 06/03/2021 FINDINGS: Cardiomegaly and mediastinum are stable. Pulmonary vasculature appears within normal limits. No focal consolidation identified. Prominent chronic interstitial lung markings similar to previous. No significant pleural effusion visualized. No pneumothorax. IMPRESSION: Cardiomegaly with no acute process identified. Electronically Signed   By: Ofilia Neas M.D.   On: 06/07/2021 16:12   DG Foot Complete Left  Result Date: 06/07/2021 CLINICAL DATA:  Left great toe amputation, EXAM: LEFT FOOT - COMPLETE 3+ VIEW COMPARISON:  05/13/2021 FINDINGS: Frontal, oblique, lateral views of the left foot are obtained. Prior amputation at the level of the first metatarsophalangeal joint. There is diffuse soft tissue swelling throughout the forefoot and remaining digits, as well as at the surgical site. On the lateral view, there is evidence of bony destruction at the base of the second proximal phalanx concerning for osteomyelitis. No other acute or destructive bony lesions. Stable osteoarthritis. Stable postsurgical changes of the left ankle. IMPRESSION: 1. Interval amputation at the first metatarsophalangeal joint. 2. Bony destruction at the base of the second proximal phalanx, best seen on lateral view, concerning for osteomyelitis. 3. Progressive soft tissue swelling throughout the left forefoot and remaining digits. Electronically Signed   By: Randa Ngo M.D.   On: 06/07/2021 19:20     EKG: Independently reviewed, with result as described above.    Assessment/Plan   Principal Problem:   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HCC) Active Problems:   AF (paroxysmal atrial fibrillation) (HCC)   Anemia, chronic  disease   HTN (hypertension)   ESRD on dialysis ([place])   Frequent falls   Generalized weakness   Incidental finding of COVID-19 virus infection      #) Left second toe osteomyelitis: Presentation concerning for such in the  setting of presenting plain comes of the left foot demonstrating interval development of bony destruction at the base of the second proximal phalanx concerning for osteomyelitis, with associated progressive soft tissue swelling throughout the left forefront relative to most recent properly films of the left foot occurring on 05/13/2021.  This is in the context of interval amputation of the left first metatarsal phalangeal joint in the setting of osteomyelitis involving the left great toe.  Aside from mild tachypnea, no additional SIRS criteria met at this time.  Therefore, criteria for sepsis not currently met.  Patient appears hemodynamically stable.  Blood cultures x2 were collected prior to initiation of IV vancomycin.  We will continue IV Vanco and add Rocephin for empiric coverage of potential underlying osteomyelitis, and pursue additional evaluation thereof from both the laboratory and imaging standpoint, as detailed below.  Plan: Monitor for results blood cultures x2 collected this evening.  Continue IV vancomycin and add Rocephin, as above.  Repeat CBC with differential in morning.  Add on ESR and CRP.  MRI left foot.       #) Frequent falls: Multiple ground-level falls, which appear to be mechanical in nature as a consequence of the patient losing balance after losing the stabilizing characteristics of his left great toe via amputation on 05/19/2021.  No associated loss of consciousness, the patient has not hit his head as compared to these falls.  Not on any blood thinners as an outpatient.  Additional factors potentially contributing to or at least increasing risk for frequent falls include prn Norco, which the patient has been intermittently taking in the postoperative  timeframe following aforementioned amputation of his left first toe.  Potential additional contribution from new osteomyelitis involving the left second toe, as further detailed above.  Of note, CT head/CT cervical spine showed no evidence of acute process, including evidence of intracranial hemorrhage or acute fracture/subluxation.  Potential additional contribution from recent generalized weakness, as above.  Plan: Fall precautions ordered.  Hold home prn Norco for now as the patient currently reports good pain control as relates the left foot.  PT/OT consults have been placed.  Further evaluation management of potential left second toe osteomyelitis, as further detailed above.       #) Incidental COVID-19 positive finding: At the time of the patient's presentation to [facility] emergency department on 06/03/2021 for ground-level fall, COVID-19 screen found to be positive.  Patient appears to have been and remains completely asymptomatic from a respiratory standpoint.  However, given his risk factors for progression of COVID-19, including that of his age and COVID disease, he was started on a 5-day course of Molnupiravir for incidental COVID-19, with final day of this 5-day course to occur on Wednesday, 06/09/2021.  As he is not being admitted for COVID-19 infection at this time, nor is he experiencing any acute respiratory symptoms, there appears to be no indication for addition of remdesivir at this time.    Plan : will continue outpatient course of Molnupiravir, as above.  CBC in the morning.      #) Generalized weakness: Patient reports 3 to 4 days of generalized weakness in the absence of any acute focal neurologic deficits to suggest acute CVA, while presenting CT head shows no evidence of acute intracranial process, including no evidence of intracranial hemorrhage.  Potentially multifactorial in nature, with the possibility of multiple concomitant infectious processes, including aforementioned  incidental COVID-19 finding from 06/03/2021 as well as concern for [place] myelitis involving the left second toe,  as further detailed above.  Plan: Further evaluation management of incidental COVID-19 finding as well as concern for cellulitis of the left second toe, as above.  Fall precautions ordered.  PT/OT consults placed for the morning.  Check TSH and MMA level.         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 Next due for routine HD on Wednesday, 06/09/2021, with the patient missing his routine hemodialysis session that was scheduled for today, as the patient was being evaluated in the emergency department at the time of this appointment. No clinical evidence for urgent overnight HD.  EDP discussed the patient's case with the on-call nephrologist for assistance with arranging for hemodialysis tomorrow.  No evidence of overt volume overload, nor any evidence of hyperkalemia or anion gap metabolic acidosis/uremia at this time.   Plan: monitor strict I's/O's, daily weights. CMP in the AM. Check mag and phos levels.  On-call nephrology consulted for assistance with scheduled hemodialysis for the morning, as above.         #) Paroxysmal atrial fibrillation: Documented history of such. In setting of CHA2DS2-VASc score of 6, there is an indication for chronic anticoagulation for thromboembolic prophylaxis.  However, in the setting of a history of gastrointestinal bleed, the patient is not currently on any chronic anticoagulation, including aspirin.  Home AV nodal blocking regimen: Coreg.  Additionally, he is on amiodarone as an outpatient.  Presenting EKG suggestive of sinus rhythm.    Plan: monitor strict I's & O's and daily weights. Repeat BMP/CBC in AM. Check serum mag level. Continue home AV nodal blocking regimen and  amiodarone.  Will monitor on telemetry in the context of increased risk for development of atrial fibrillation with RVR, given potential for multiple underlying infectious processes at the time of presentation, including COVID-19 infection as well as suspected osteomyelitis of the left second toe, as above.        #) GERD: documented h/o such; on Protonix as outpatient.   Plan: continue home PPI.            #) Essential Hypertension: documented h/o such, with outpatient antihypertensive regimen including Coreg.  SBP's in the ED today: 140s to 160s mmHg.   Plan: Close monitoring of subsequent BP via routine VS. continue home Coreg      #) Anemia of chronic disease: Documented history of such, associated baseline hemoglobin of 8-11, with presenting hemoglobin found to be consistent with this range.  No evidence of active bleed at this time.  Plan: Repeat CBC in the morning.       DVT prophylaxis: SCD's   Code Status: DNR, as confirmed per my discussions with the patient this evening Family Communication: none Disposition Plan: Per Rounding Team Consults called: EDP discussed patient's case with on-call nephrology, as further detailed above;  Admission status: Inpatient; PCU  Warrants inpatient status on basis of requiring further evaluation management of potential osteomyelitis involving the left second toe, including need for IV antibiotics and further radiographic/laboratory evaluation for such, as further detailed above.   PLEASE NOTE THAT DRAGON DICTATION SOFTWARE WAS USED IN THE CONSTRUCTION OF THIS NOTE.   Howard Lake DO [facility]  From [place]   06/07/2021, 10:02 PM

## 2021-06-08 ENCOUNTER — Inpatient Hospital Stay (HOSPITAL_COMMUNITY): Payer: Medicare Other

## 2021-06-08 DIAGNOSIS — M869 Osteomyelitis, unspecified: Secondary | ICD-10-CM | POA: Diagnosis not present

## 2021-06-08 LAB — CBC WITH DIFFERENTIAL/PLATELET
Abs Immature Granulocytes: 0.02 10*3/uL (ref 0.00–0.07)
Basophils Absolute: 0 10*3/uL (ref 0.0–0.1)
Basophils Relative: 0 %
Eosinophils Absolute: 0 10*3/uL (ref 0.0–0.5)
Eosinophils Relative: 0 %
HCT: 31.4 % — ABNORMAL LOW (ref 39.0–52.0)
Hemoglobin: 10.1 g/dL — ABNORMAL LOW (ref 13.0–17.0)
Immature Granulocytes: 0 %
Lymphocytes Relative: 11 %
Lymphs Abs: 0.6 10*3/uL — ABNORMAL LOW (ref 0.7–4.0)
MCH: 31 pg (ref 26.0–34.0)
MCHC: 32.2 g/dL (ref 30.0–36.0)
MCV: 96.3 fL (ref 80.0–100.0)
Monocytes Absolute: 0.5 10*3/uL (ref 0.1–1.0)
Monocytes Relative: 9 %
Neutro Abs: 4.4 10*3/uL (ref 1.7–7.7)
Neutrophils Relative %: 80 %
Platelets: 176 10*3/uL (ref 150–400)
RBC: 3.26 MIL/uL — ABNORMAL LOW (ref 4.22–5.81)
RDW: 15.5 % (ref 11.5–15.5)
WBC: 5.6 10*3/uL (ref 4.0–10.5)
nRBC: 0 % (ref 0.0–0.2)

## 2021-06-08 LAB — CBG MONITORING, ED
Glucose-Capillary: 73 mg/dL (ref 70–99)
Glucose-Capillary: 102 mg/dL — ABNORMAL HIGH (ref 70–99)
Glucose-Capillary: 110 mg/dL — ABNORMAL HIGH (ref 70–99)

## 2021-06-08 LAB — COMPREHENSIVE METABOLIC PANEL
ALT: 14 U/L (ref 0–44)
AST: 29 U/L (ref 15–41)
Albumin: 2.6 g/dL — ABNORMAL LOW (ref 3.5–5.0)
Alkaline Phosphatase: 111 U/L (ref 38–126)
Anion gap: 17 — ABNORMAL HIGH (ref 5–15)
BUN: 23 mg/dL (ref 8–23)
CO2: 24 mmol/L (ref 22–32)
Calcium: 8.6 mg/dL — ABNORMAL LOW (ref 8.9–10.3)
Chloride: 92 mmol/L — ABNORMAL LOW (ref 98–111)
Creatinine, Ser: 4.41 mg/dL — ABNORMAL HIGH (ref 0.61–1.24)
GFR, Estimated: 13 mL/min — ABNORMAL LOW (ref >60)
Glucose, Bld: 47 mg/dL — ABNORMAL LOW (ref 70–99)
Potassium: 4.7 mmol/L (ref 3.5–5.1)
Sodium: 133 mmol/L — ABNORMAL LOW (ref 135–145)
Total Bilirubin: 0.9 mg/dL (ref 0.3–1.2)
Total Protein: 5.9 g/dL — ABNORMAL LOW (ref 6.5–8.1)

## 2021-06-08 LAB — TSH: TSH: 2.64 u[IU]/mL (ref 0.350–4.500)

## 2021-06-08 LAB — C-REACTIVE PROTEIN: CRP: 4.1 mg/dL — ABNORMAL HIGH (ref <1.0)

## 2021-06-08 LAB — PHOSPHORUS: Phosphorus: 3.3 mg/dL (ref 2.5–4.6)

## 2021-06-08 LAB — MAGNESIUM: Magnesium: 2 mg/dL (ref 1.7–2.4)

## 2021-06-08 LAB — HEPATITIS B SURFACE ANTIGEN: Hepatitis B Surface Ag: NONREACTIVE

## 2021-06-08 LAB — SEDIMENTATION RATE: Sed Rate: 25 mm/hr — ABNORMAL HIGH (ref 0–16)

## 2021-06-08 LAB — HEPATITIS B SURFACE ANTIBODY,QUALITATIVE: Hep B S Ab: REACTIVE — AB

## 2021-06-08 MED ORDER — VANCOMYCIN HCL 750 MG/150ML IV SOLN
750.0000 mg | Freq: Once | INTRAVENOUS | Status: AC
Start: 2021-06-08 — End: 2021-06-08
  Administered 2021-06-08: 750 mg via INTRAVENOUS
  Filled 2021-06-08: qty 150

## 2021-06-08 MED ORDER — VANCOMYCIN VARIABLE DOSE PER UNSTABLE RENAL FUNCTION (PHARMACIST DOSING): Status: DC

## 2021-06-08 MED ORDER — DEXTROSE 50 % IV SOLN: 1.0000 | INTRAVENOUS | Status: AC

## 2021-06-08 MED ORDER — SODIUM CHLORIDE 0.9 % IV SOLN
1.0000 g | INTRAVENOUS | Status: DC
  Administered 2021-06-08: 1 g via INTRAVENOUS
  Filled 2021-06-08 (×3): qty 1

## 2021-06-08 MED ORDER — VANCOMYCIN HCL IN DEXTROSE 1-5 GM/200ML-% IV SOLN
1000.0000 mg | INTRAVENOUS | Status: DC
  Filled 2021-06-08: qty 200

## 2021-06-08 MED ORDER — PNEUMOCOCCAL 20-VAL CONJ VACC 0.5 ML IM SUSY
0.5000 mL | PREFILLED_SYRINGE | INTRAMUSCULAR | Status: AC
  Administered 2021-06-09: 0.5 mL via INTRAMUSCULAR
  Filled 2021-06-08: qty 0.5

## 2021-06-08 MED ORDER — METRONIDAZOLE 500 MG/100ML IV SOLN
500.0000 mg | Freq: Two times a day (BID) | INTRAVENOUS | Status: DC
  Administered 2021-06-08 – 2021-06-14 (×12): 500 mg via INTRAVENOUS
  Filled 2021-06-08 (×12): qty 100

## 2021-06-08 MED ORDER — PNEUMOCOCCAL VAC POLYVALENT 25 MCG/0.5ML IJ INJ
0.5000 mL | INJECTION | INTRAMUSCULAR | Status: DC
Start: 2021-06-09 — End: 2021-06-08

## 2021-06-08 MED ORDER — CHLORHEXIDINE GLUCONATE CLOTH 2 % EX PADS
6.0000 | MEDICATED_PAD | Freq: Every day | CUTANEOUS | Status: DC
  Administered 2021-06-08 – 2021-06-09 (×2): 6 via TOPICAL

## 2021-06-08 NOTE — Consult Note (Signed)
[facility] Renal Consultation Note    Indication for Consultation:  Management of ESRD/hemodialysis, anemia, hypertension/volume, and secondary hyperparathyroidism.  HPI: Darryl Diaz is a 81 y.o. male with PMH including ESRD on dialysis, paroxysmal a.fib., hypertension, and anemia who recent amputation of left great toe on 05/19/21. Pt presented to the ED with 4-5 days of frequent falls. Reports losing his balance but denies any associated head injury, dizziness, palpitations, syncope or chest pain. He was also evaluated in the ED for a fall on 06/03/21 and COVID-19 screening was positive at that time. He was started on molnupirvavir. He reports he has had a cough and wheezing since last week but denies any shortness of breath, fever or chills.  Patient missed his dialysis on Monday due to illness. He reports his last dialysis was Thursday but our notes indicate it was Friday.  In the ED, blood pressures were hypertensive, O2 saturations stable on RA. CXR without evidence of pulmonary edema. CT head and spine unremarkable. X-ray of the left foot showed interval development of bony destruction of the second proximal phalanx concerning for osteomyelitis, prompting admission. Labs notable for Na 130, K+ 4.7, BUN 20, Cr 4.25, Ca 8.5, Alb 2.5, WBC 5.0, Hgb 9.3.   At present, patient reports he is hungry but otherwise feeling ok. Reports ongoing cough. Denies CP, palpitations, dizziness, abdominal pain and nausea. No foot pain  at present. Reports a good appetite.   Past Medical History:  Diagnosis Date   Alcohol abuse 27/78/2423   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[place]) 03/17/2020   Alcoholism ([place])    in remission following wife's death   Anemia    Ascending aorta dilation (HCC)    Atrial fibrillation (HCC)    Cardiac arrest ([place]) 04/20/2019   12 min CPR with epi   Chronic diastolic CHF (congestive heart failure) (HCC)    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[place])    Mon Wed  Fri   Fatty liver    Gastritis    GERD (gastroesophageal reflux disease)    H/O seasonal allergies    History of blood transfusion    History of GI bleed    Hx of adenomatous colonic polyps 08/10/2017   Hyperlipidemia    Hypertension    Insomnia    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   Mallory-Weiss tear    OA (osteoarthritis)    hands   OSA (obstructive sleep apnea)    No longer uses CPAP   Persistent atrial fibrillation with rapid ventricular response ([place]) 02/05/2020   Recurrent syncope    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[facility])    Past Surgical History:  Procedure Laterality Date   AMPUTATION Left 05/19/2021   Procedure: LEFT GREAT TOE AMPUTATION;  Surgeon: Newt Minion, MD;  Location: [place];  Service: Orthopedics;  Laterality: Left;   ANKLE SURGERY Left 2014   had rods put in    AV FISTULA PLACEMENT Left 04/21/2021   Procedure: LEFT ARM ARTERIOVENOUS (AV) FISTULA CREATION - Left brachiocephalic fistula;  Surgeon: Marty Heck, MD;  Location: [place];  Service: Vascular;  Laterality: Left;   BIOPSY  02/28/2019   Procedure: BIOPSY;  Surgeon: Milus Banister, MD;  Location: [place];  Service: Endoscopy;;   CARDIOVERSION  02/06/2020   CARDIOVERSION N/A 02/06/2020   Procedure: CARDIOVERSION;  Surgeon: Werner Lean, MD;  Location: [place] ENDOSCOPY;  Service: Cardiovascular;  Laterality: N/A;   CARDIOVERSION N/A 02/24/2020   Procedure: CARDIOVERSION;  Surgeon:  Werner Lean, MD;  Location: [place];  Service: Cardiovascular;  Laterality: N/A;   COLONOSCOPY     [place]   COLONOSCOPY WITH PROPOFOL N/A 01/08/2021   Procedure: COLONOSCOPY WITH PROPOFOL;  Surgeon: Milus Banister, MD;  Location: [facility] ENDOSCOPY;  Service: Endoscopy;  Laterality: N/A;   ESOPHAGOGASTRODUODENOSCOPY (EGD) WITH PROPOFOL N/A 02/28/2019   Procedure: ESOPHAGOGASTRODUODENOSCOPY (EGD) WITH PROPOFOL;  Surgeon: Milus Banister, MD;  Location: [facility] ENDOSCOPY;  Service:  Endoscopy;  Laterality: N/A;   HIP ARTHROPLASTY Left 04/01/2016   Procedure: ARTHROPLASTY BIPOLAR HIP (HEMIARTHROPLASTY);  Surgeon: Paralee Cancel, MD;  Location: [place];  Service: Orthopedics;  Laterality: Left;   HIP CLOSED REDUCTION Left 04/30/2016   Procedure: CLOSED REDUCTION HIP;  Surgeon: Wylene Simmer, MD;  Location: [place];  Service: Orthopedics;  Laterality: Left;   HOT HEMOSTASIS N/A 01/08/2021   Procedure: HOT HEMOSTASIS (ARGON PLASMA COAGULATION/BICAP);  Surgeon: Milus Banister, MD;  Location: [facility] ENDOSCOPY;  Service: Endoscopy;  Laterality: N/A;   IR RADIOLOGIST EVAL & MGMT  12/31/2020   TONSILLECTOMY     TOTAL HIP ARTHROPLASTY Right 10/15/2019   Procedure: TOTAL HIP ARTHROPLASTY ANTERIOR APPROACH;  Surgeon: Paralee Cancel, MD;  Location: [facility];  Service: Orthopedics;  Laterality: Right;  70 mins   Family History  Problem Relation Age of Onset   Heart disease Mother 61   Hypertension Mother    Arthritis Mother    Heart failure Father 18   Stroke Maternal Aunt    Heart failure Sister    Colon cancer Neg Hx    Esophageal cancer Neg Hx    Stomach cancer Neg Hx    Rectal cancer Neg Hx    Social History:  reports that he has quit smoking. His smoking use included cigarettes. He has a 20.00 pack-year smoking history. He has never used smokeless tobacco. He reports that he does not currently use alcohol after a past usage of about 3.0 standard drinks per week. He reports that he does not use drugs.  ROS: As per HPI otherwise negative.  Physical Exam: Vitals:   06/08/21 0945 06/08/21 1000 06/08/21 1015 06/08/21 1030  BP: (!) 177/62 (!) 184/63 (!) 174/64 (!) 183/64  Pulse: (!) 55 (!) 59 (!) 58 (!) 58  Resp: 19 16 17 17   Temp:      TempSrc:      SpO2: 98% 98% 100% 97%     General: Well developed, well nourished, in no acute distress. Head: Normocephalic, atraumatic, sclera non-icteric, mucus membranes are moist. Neck: JVD not elevated. Lungs: + expiratory wheezing, clears with  coughing. No rhonchi or rales. Respirations unlabored.  Heart: RRR with normal S1, S2. No murmurs, rubs, or gallops appreciated. Abdomen: Soft, non-tender, non-distended with normoactive bowel sounds. No rebound/guarding. No obvious abdominal masses. Lower extremities: Trace edema RLE, 2+ nonpitting edema LLE, s/p great toe amputation, erythema near incision.  Neuro: Alert and oriented X 3 with some confusion noted (incorrect date of last HD). Moves all extremities spontaneously. Psych:  Responds to questions appropriately with a normal affect. Dialysis Access: TDC, LUE AVF + t/b  Allergies  Allergen Reactions   Penicillins Rash   Prior to Admission medications   Medication Sig Start Date End Date Taking? Authorizing Provider  ferrous sulfate 324 MG TBEC Take 324 mg by mouth every morning.   Yes [provider]  nitroGLYCERIN (NITROSTAT) 0.4 MG SL tablet Place 1 tablet (0.4 mg total) under the tongue every 5 (five) minutes x 3 doses as needed  for chest pain. 07/28/20  Yes Baldwin Jamaica, PA-C  traZODone (DESYREL) 50 MG tablet Take 1 tablet (50 mg total) by mouth at bedtime. Patient taking differently: Take 50 mg by mouth at bedtime as needed for sleep. 03/10/20  Yes Angiulli, Lavon Paganini, PA-C  acetaminophen (TYLENOL) 325 MG tablet Take 2 tablets (650 mg total) by mouth every 4 (four) hours as needed for headache or mild pain. 03/10/20   Angiulli, Lavon Paganini, PA-C  amiodarone (PACERONE) 200 MG tablet TAKE 1 TABLET BY MOUTH ONCE DAILY Patient taking differently: Take 200 mg by mouth daily. 05/17/21   Richardson Dopp T, PA-C  atorvastatin (LIPITOR) 20 MG tablet Take 1 tablet (20 mg total) by mouth daily. 03/10/20   Angiulli, Lavon Paganini, PA-C  carvedilol (COREG) 6.25 MG tablet Take 1 tablet (6.25 mg total) by mouth 2 (two) times daily. 02/11/21   Sherren Mocha, MD  cetirizine (ZYRTEC) 10 MG tablet Take 10 mg by mouth daily. Patient not taking: Reported on 06/08/2021    [provider]   doxycycline (VIBRA-TABS) 100 MG tablet Take 100 mg by mouth 2 (two) times daily. 06/01/21   [provider]  escitalopram (LEXAPRO) 10 MG tablet Take 1 tablet (10 mg total) by mouth daily. 03/10/20   Angiulli, Lavon Paganini, PA-C  FOLIC ACID PO Take 016 mcg by mouth daily.    [provider]  HYDROcodone-acetaminophen (NORCO/VICODIN) 5-325 MG tablet Take 1 tablet by mouth every 4 (four) hours as needed. Patient taking differently: Take 1 tablet by mouth every 4 (four) hours as needed (pain). 05/19/21   Newt Minion, MD  ketoconazole (NIZORAL) 2 % cream Apply 1 application topically daily. To dry scaly skin on feet 07/23/20   Criselda Peaches, DPM  Magnesium Oxide 400 MG CAPS Take 1 capsule (400 mg total) by mouth daily. 10/17/20   Eugenie Filler, MD  molnupiravir EUA (LAGEVRIO) 200 mg CAPS capsule Take 4 capsules (800 mg total) by mouth 2 (two) times daily for 5 days. 06/04/21 06/09/21  Wyvonnia Dusky, MD  Multiple Vitamins-Minerals (CENTRUM SILVER 50+MEN) TABS Take 1 tablet by mouth daily with breakfast.    [provider]  nystatin (MYCOSTATIN/NYSTOP) powder Apply topically 2 (two) times daily. Patient not taking: Reported on 06/08/2021 01/22/21   Antonieta Pert, MD  pantoprazole (PROTONIX) 40 MG tablet Take 1 tablet (40 mg total) by mouth daily. 03/10/20   Angiulli, Lavon Paganini, PA-C  potassium chloride SA (KLOR-CON) 20 MEQ tablet Take 1 tablet (20 mEq total) by mouth daily. 10/17/20 10/12/21  Eugenie Filler, MD  sodium bicarbonate 650 MG tablet Take 1 tablet (650 mg total) by mouth 2 (two) times daily. 01/22/21   Antonieta Pert, MD  tamsulosin (FLOMAX) 0.4 MG CAPS capsule Take 0.4 mg by mouth at bedtime. 06/01/21   [provider]  torsemide (DEMADEX) 20 MG tablet Take 60 mg by mouth See admin instructions. Take 3 tablets (60 mg) by mouth every morning, may also take 3 tablets (60 mg) in the afternoon as needed for swelling    [provider]  torsemide 40 MG TABS Take  40 mg by mouth daily. 01/22/21   Antonieta Pert, MD   Current Facility-Administered Medications  Medication Dose Route Frequency Provider Last Rate Last Admin   acetaminophen (TYLENOL) tablet 650 mg  650 mg Oral Q6H PRN Howerter, Justin B, DO       Or   acetaminophen (TYLENOL) suppository 650 mg  650 mg Rectal Q6H PRN Howerter,  Ethelda Chick, DO       amiodarone (PACERONE) tablet 200 mg  200 mg Oral Daily Howerter, Justin B, DO   200 mg at 06/08/21 1034   atorvastatin (LIPITOR) tablet 20 mg  20 mg Oral Daily Howerter, Justin B, DO   20 mg at 06/08/21 1034   carvedilol (COREG) tablet 6.25 mg  6.25 mg Oral BID Howerter, Justin B, DO   6.25 mg at 06/08/21 0024   cefTRIAXone (ROCEPHIN) 2 g in sodium chloride 0.9 % 100 mL IVPB  2 g Intravenous Q24H Howerter, Justin B, DO   Stopped at 06/07/21 2238   dextrose 50 % solution 50 mL  1 ampule Intravenous STAT Hall, Carole N, DO       loratadine (CLARITIN) tablet 10 mg  10 mg Oral Daily Howerter, Justin B, DO   10 mg at 06/08/21 1034   molnupiravir EUA (LAGEVRIO) capsule 800 mg  4 capsule Oral BID Howerter, Justin B, DO   800 mg at 06/08/21 0025   pantoprazole (PROTONIX) EC tablet 40 mg  40 mg Oral Daily Howerter, Justin B, DO   40 mg at 06/08/21 1034   sodium bicarbonate tablet 650 mg  650 mg Oral BID Howerter, Justin B, DO   650 mg at 06/08/21 1034   [START ON 06/09/2021] vancomycin (VANCOCIN) IVPB 1000 mg/200 mL premix  1,000 mg Intravenous Q M,W,F-HD Bertis Ruddy, [facility]       Current Outpatient Medications  Medication Sig Dispense Refill   ferrous sulfate 324 MG TBEC Take 324 mg by mouth every morning.     nitroGLYCERIN (NITROSTAT) 0.4 MG SL tablet Place 1 tablet (0.4 mg total) under the tongue every 5 (five) minutes x 3 doses as needed for chest pain. 30 tablet 12   traZODone (DESYREL) 50 MG tablet Take 1 tablet (50 mg total) by mouth at bedtime. (Patient taking differently: Take 50 mg by mouth at bedtime as needed for sleep.) 30 tablet 0   acetaminophen  (TYLENOL) 325 MG tablet Take 2 tablets (650 mg total) by mouth every 4 (four) hours as needed for headache or mild pain.     amiodarone (PACERONE) 200 MG tablet TAKE 1 TABLET BY MOUTH ONCE DAILY (Patient taking differently: Take 200 mg by mouth daily.) 90 tablet 2   atorvastatin (LIPITOR) 20 MG tablet Take 1 tablet (20 mg total) by mouth daily. 30 tablet 0   carvedilol (COREG) 6.25 MG tablet Take 1 tablet (6.25 mg total) by mouth 2 (two) times daily. 180 tablet 3   cetirizine (ZYRTEC) 10 MG tablet Take 10 mg by mouth daily. (Patient not taking: Reported on 06/08/2021)     doxycycline (VIBRA-TABS) 100 MG tablet Take 100 mg by mouth 2 (two) times daily.     escitalopram (LEXAPRO) 10 MG tablet Take 1 tablet (10 mg total) by mouth daily. 30 tablet 0   FOLIC ACID PO Take 338 mcg by mouth daily.     HYDROcodone-acetaminophen (NORCO/VICODIN) 5-325 MG tablet Take 1 tablet by mouth every 4 (four) hours as needed. (Patient taking differently: Take 1 tablet by mouth every 4 (four) hours as needed (pain).) 30 tablet 0   ketoconazole (NIZORAL) 2 % cream Apply 1 application topically daily. To dry scaly skin on feet 60 g 2   Magnesium Oxide 400 MG CAPS Take 1 capsule (400 mg total) by mouth daily.     molnupiravir EUA (LAGEVRIO) 200 mg CAPS capsule Take 4 capsules (800 mg total) by mouth 2 (two) times daily  for 5 days. 40 capsule 0   Multiple Vitamins-Minerals (CENTRUM SILVER 50+MEN) TABS Take 1 tablet by mouth daily with breakfast.     nystatin (MYCOSTATIN/NYSTOP) powder Apply topically 2 (two) times daily. (Patient not taking: Reported on 06/08/2021) 15 g 0   pantoprazole (PROTONIX) 40 MG tablet Take 1 tablet (40 mg total) by mouth daily. 30 tablet 0   potassium chloride SA (KLOR-CON) 20 MEQ tablet Take 1 tablet (20 mEq total) by mouth daily. 90 tablet 3   sodium bicarbonate 650 MG tablet Take 1 tablet (650 mg total) by mouth 2 (two) times daily.     tamsulosin (FLOMAX) 0.4 MG CAPS capsule Take 0.4 mg by mouth at  bedtime.     torsemide (DEMADEX) 20 MG tablet Take 60 mg by mouth See admin instructions. Take 3 tablets (60 mg) by mouth every morning, may also take 3 tablets (60 mg) in the afternoon as needed for swelling     torsemide 40 MG TABS Take 40 mg by mouth daily.     Facility-Administered Medications Ordered in Other Encounters  Medication Dose Route Frequency Provider Last Rate Last Admin   metoprolol tartrate (LOPRESSOR) injection    Anesthesia Intra-op Mariea Clonts, CRNA   5 mg at 02/27/19 1318   Labs: Basic Metabolic Panel: Recent Labs  Lab 06/05/21 0741 06/07/21 1530 06/08/21 0253  NA 135 130* 133*  K 4.2 4.7 4.7  CL 96* 93* 92*  CO2 28 24 24   GLUCOSE 84 75 47*  BUN 8 20 23   CREATININE 2.74* 4.25* 4.41*  CALCIUM 8.8* 8.5* 8.6*  PHOS  --   --  3.3   Liver Function Tests: Recent Labs  Lab 06/07/21 1530 06/08/21 0253  AST 30 29  ALT 15 14  ALKPHOS 106 111  BILITOT 0.8 0.9  PROT 5.4* 5.9*  ALBUMIN 2.5* 2.6*   CBC: Recent Labs  Lab 06/03/21 1602 06/05/21 0741 06/07/21 1530 06/08/21 0601  WBC 7.6 4.2 5.0 5.6  NEUTROABS  --  2.7 3.3 4.4  HGB 10.2* 9.4* 9.3* 10.1*  HCT 31.7* 28.8* 28.0* 31.4*  MCV 96.9 97.0 95.2 96.3  PLT 241 169 182 176   CBG: Recent Labs  Lab 06/03/21 1602 06/07/21 1526 06/08/21 0611 06/08/21 0814  GLUCAP 63* 81 73 102*    Studies/Results: CT Head Wo Contrast  Result Date: 06/07/2021 CLINICAL DATA:  [place] today.  Possibly hit his head. EXAM: CT HEAD WITHOUT CONTRAST CT CERVICAL SPINE WITHOUT CONTRAST TECHNIQUE: Multidetector CT imaging of the head and cervical spine was performed following the standard protocol without intravenous contrast. Multiplanar CT image reconstructions of the cervical spine were also generated. RADIATION DOSE REDUCTION: This exam was performed according to the departmental dose-optimization program which includes automated exposure control, adjustment of the mA and/or kV according to patient size and/or use of  iterative reconstruction technique. COMPARISON:  Head CT dated 06/03/2021. Head and cervical spine CT dated 05/01/2021. FINDINGS: CT HEAD FINDINGS Brain: No significant change in moderate to marked diffuse enlargement of the ventricles and subarachnoid spaces. Mild-to-moderate patchy white matter low density in both cerebral hemispheres without significant change. No intracranial hemorrhage, mass lesion or CT evidence of acute infarction. Vascular: No hyperdense vessel or unexpected calcification. Atheromatous arterial calcifications at the skull base. Skull: Normal. Negative for fracture or focal lesion. Sinuses/Orbits: Status post right cataract extraction. Bilateral anterior ethmoid sinus mucosal thickening. Mild left maxillary sinus mucosal thickening. Other: None. CT CERVICAL SPINE FINDINGS Alignment: Stable reversal of the normal cervical  lordosis. No subluxations. Skull base and vertebrae: No acute fracture. No primary bone lesion or focal pathologic process. Soft tissues and spinal canal: No prevertebral fluid or swelling. No visible canal hematoma. Disc levels:  Stable multilevel degenerative changes. Upper chest: Clear lung apices. Other: Bilateral carotid artery calcifications. IMPRESSION: 1. No skull fracture or intracranial hemorrhage. 2. No cervical spine fracture or subluxation. 3. Stable diffuse cerebral and cerebellar atrophy in chronic small vessel white matter ischemic changes. 4. Stable reversal of the normal cervical lordosis and multilevel cervical spine degenerative changes. 5. Bilateral carotid artery atheromatous calcifications. Electronically Signed   By: Claudie Revering M.D.   On: 06/07/2021 16:38   CT Cervical Spine Wo Contrast  Result Date: 06/07/2021 CLINICAL DATA:  [place] today.  Possibly hit his head. EXAM: CT HEAD WITHOUT CONTRAST CT CERVICAL SPINE WITHOUT CONTRAST TECHNIQUE: Multidetector CT imaging of the head and cervical spine was performed following the standard protocol without  intravenous contrast. Multiplanar CT image reconstructions of the cervical spine were also generated. RADIATION DOSE REDUCTION: This exam was performed according to the departmental dose-optimization program which includes automated exposure control, adjustment of the mA and/or kV according to patient size and/or use of iterative reconstruction technique. COMPARISON:  Head CT dated 06/03/2021. Head and cervical spine CT dated 05/01/2021. FINDINGS: CT HEAD FINDINGS Brain: No significant change in moderate to marked diffuse enlargement of the ventricles and subarachnoid spaces. Mild-to-moderate patchy white matter low density in both cerebral hemispheres without significant change. No intracranial hemorrhage, mass lesion or CT evidence of acute infarction. Vascular: No hyperdense vessel or unexpected calcification. Atheromatous arterial calcifications at the skull base. Skull: Normal. Negative for fracture or focal lesion. Sinuses/Orbits: Status post right cataract extraction. Bilateral anterior ethmoid sinus mucosal thickening. Mild left maxillary sinus mucosal thickening. Other: None. CT CERVICAL SPINE FINDINGS Alignment: Stable reversal of the normal cervical lordosis. No subluxations. Skull base and vertebrae: No acute fracture. No primary bone lesion or focal pathologic process. Soft tissues and spinal canal: No prevertebral fluid or swelling. No visible canal hematoma. Disc levels:  Stable multilevel degenerative changes. Upper chest: Clear lung apices. Other: Bilateral carotid artery calcifications. IMPRESSION: 1. No skull fracture or intracranial hemorrhage. 2. No cervical spine fracture or subluxation. 3. Stable diffuse cerebral and cerebellar atrophy in chronic small vessel white matter ischemic changes. 4. Stable reversal of the normal cervical lordosis and multilevel cervical spine degenerative changes. 5. Bilateral carotid artery atheromatous calcifications. Electronically Signed   By: Claudie Revering M.D.    On: 06/07/2021 16:38   MR FOOT LEFT WO CONTRAST  Result Date: 06/08/2021 CLINICAL DATA:  Osteomyelitis, foot EXAM: MRI OF THE LEFT FOOT WITHOUT CONTRAST TECHNIQUE: Multiplanar, multisequence MR imaging of the left forefoot was performed. No intravenous contrast was administered. COMPARISON:  Left foot radiograph 06/07/2021 FINDINGS: Bones/Joint/Cartilage Prior great toe amputation. There is bony edema and confluent low T1 signal within the second toe proximal phalanx and mid to distal second metatarsal. The second toe proximal phalanx is dorsally dislocated from the second metatarsal head. There is edema signal within the first and third metatarsal heads with preserved T1 signal. Similar findings but to a lesser degree in the fourth metatarsal head. There is a subacute fracture of the third metatarsal head/neck with mild angulation. Ligaments Surgically disrupted first MTP ligaments. Torn second MTP plantar plate and collateral ligaments. The third through fifth MTP collateral ligaments appear intact without evidence of plantar plate tear. The Lisfranc ligament appears intact. Muscles and Tendons Diffuse intramuscular edema and  muscle atrophy in the foot as is commonly seen in diabetics. Soft tissues Diffuse soft tissue swelling of the foot. There is some localized fluid in the first webspace between the first and second metatarsal heads, not well-defined. 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 Dorsal dislocation of the second MTP joint. Bony edema in the adjacent first and third metatarsal heads which is favored to be reactive, though early osteomyelitis could have a similar appearance. Subacute third metatarsal head/neck fracture with mild angulation, accounting for some of the aforementioned bony edema. Localized fluid in the first webspace between the first and second metatarsal heads, not well-defined but could represent developing phlegmon/abscess.  Reactive mild edema in the fourth metatarsal head. Electronically Signed   By: Maurine Simmering M.D.   On: 06/08/2021 09:28   DG Chest Portable 1 View  Result Date: 06/07/2021 CLINICAL DATA:  Concern for pneumonia EXAM: PORTABLE CHEST 1 VIEW COMPARISON:  Chest x-ray 06/03/2021 FINDINGS: Cardiomegaly and mediastinum are stable. Pulmonary vasculature appears within normal limits. No focal consolidation identified. Prominent chronic interstitial lung markings similar to previous. No significant pleural effusion visualized. No pneumothorax. IMPRESSION: Cardiomegaly with no acute process identified. Electronically Signed   By: Ofilia Neas M.D.   On: 06/07/2021 16:12   DG Foot Complete Left  Result Date: 06/07/2021 CLINICAL DATA:  Left great toe amputation, EXAM: LEFT FOOT - COMPLETE 3+ VIEW COMPARISON:  05/13/2021 FINDINGS: Frontal, oblique, lateral views of the left foot are obtained. Prior amputation at the level of the first metatarsophalangeal joint. There is diffuse soft tissue swelling throughout the forefoot and remaining digits, as well as at the surgical site. On the lateral view, there is evidence of bony destruction at the base of the second proximal phalanx concerning for osteomyelitis. No other acute or destructive bony lesions. Stable osteoarthritis. Stable postsurgical changes of the left ankle. IMPRESSION: 1. Interval amputation at the first metatarsophalangeal joint. 2. Bony destruction at the base of the second proximal phalanx, best seen on lateral view, concerning for osteomyelitis. 3. Progressive soft tissue swelling throughout the left forefoot and remaining digits. Electronically Signed   By: Randa Ngo M.D.   On: 06/07/2021 19:20    Dialysis Orders: Center: [place]  on MWF . 180NRe 4 hours BFR 400 DFR Auto 1.5 EDW 90kg 2K, 2Ca, TDC No heparin bolus Mircera 28mcg IV q 2 weeks- last dose Venofer 100mg  q HD, 1/23-2/13  Assessment/Plan:  Left second toe osteomyelitis: Recent  amputation of left first metatarsal joint on 05/19/21. On IV vancomycin and rocephin. Management per primary team.  Recurrent falls: Likely 2/2 osteomyelitis and norco. PT/OT consulted. No evidence of hypotension and pt denies dizziness.  COVID 19: tested positve for COVID-19 on 06/03/21 and was started on molnupirvavir. No respiratory symptoms at present. Management per primary team.   ESRD:  MWF schedule, last HD was 1/27, missed yesterday. Volume overload but improved compared to his baseline. Respiratory symptoms are more likely due to COVID. Will plan for short HD today, as staffing allows.   Hypertension/volume: BP elevated. On amiodarone and torsemide outpatient. Reports he still makes urine so will continue torsemide. Has been getting below his outpatient EDW and seems to be losing some weight. UF with HD as tolerated.   Anemia: Hgb 10.1. Not due for ESA. Will hold further IV iron until acute infection is resolved.  Metabolic bone disease: Calcium and phos controlled. Not on VDRA.   Nutrition:  Renal diet/fluid restrictions  Anice Paganini, PA-C 06/08/2021, 10:44 AM  [facility] Pager: [phone]

## 2021-06-08 NOTE — ED Notes (Signed)
Breakfast orders placed 

## 2021-06-08 NOTE — Progress Notes (Addendum)
PROGRESS NOTE  Darryl Diaz GNO:[id number] DOB: 11/15/1940 DOA: 06/07/2021 PCP: [name], MD  HPI/Recap of past 24 hours: Darryl Diaz is a 81 y.o. 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  Covid-19 screening test positive on 06/03/21, started on antiviral Molnupiravir.  On broad spectrum IV antibiotics for left foot osteomyelitis.  06/08/2021: Patient was seen and examined at his bedside at the hemodialysis center on 5 C unit.  No pain in his left foot at the time of this visit.  Greenish purulent drainage noted at site of his wound.  Orthopedic surgery consult.   Assessment/Plan: Principal Problem:   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HCC) Active Problems:   AF (paroxysmal atrial fibrillation) (HCC)   Anemia, chronic disease   HTN (hypertension)   ESRD on dialysis ([place])   Frequent falls   Generalized weakness   Incidental finding of COVID-19 virus infection  Left second toe proximal phalanx and mid to distal second metatarsal osteomyelitis seen on left foot MRI:  Received IV vancomycin and Rocephin, as above.  Continue IV vancomycin, broadened abx with Cefepime and IV flagyl, will narrow down when cultures result.  Follow blood cultures x 2 Monitor fever curve and WBC Orthopedic surgery consulted, followed by Dr.Duda outpatient Pain regimen along with bowel regimen  Frequent falls with imbalance of unclear:  Multiple ground-level falls Obtain vitamin B12 level PT/OT assessment and fall precautions Avoid benzodiazepine and opiates as able   Recent COVID-19 positive finding:  Covid-19 Positive  06/03/21 CXR cardiomegaly with no acute cardiopulmonary process. O2 sat 98% RA Trend inflammatory markers Molnupiravir, started 06/07/21 Bronchodilators  Generalized weakness/Physical debility:  Likely contributed by acute viral illness in the setting of multiple comorbidities. PT OT to assess with fall precautions Encourage increase in oral protein calorie intake   ESRD on HD MWF HD on 06/08/2021 Appreciate nephrology's assistance.  Paroxysmal atrial fibrillation, not on oral anticoagulation:  CHA2DS2-VASc score of 6, there is an indication for chronic anticoagulation for thromboembolic prophylaxis.  However, in the setting of a history of gastrointestinal bleed, the patient is not currently on any chronic anticoagulation. Continue home Coreg and amiodarone.  Monitor on telemetry   GERD:  Continue home PPI  Essential Hypertension:  BP is not at goal, elevated.   Continue home oral antihypertensives  Monitor vital signs  IV antihypertensive as needed with parameters.    Anemia of chronic disease:  Hemoglobin 10.1, stable and uptrending from 1.3. Continue to monitor H&H    Critical care time 65 minutes.       DVT prophylaxis: SCD's   Code Status: DNR, as confirmed per my discussions with the patient this evening Family Communication: none Disposition Plan: Likely will return to home after orthopedic surgery's consult. Consults called: EDP discussed patient's case with on-call nephrology, as further detailed above; orthopedic surgery. Admission status: Inpatient; PCU   Status is: Inpatient  Patient requires at least 2 midnights for further evaluation and treatment of present condition      Objective: Vitals:   06/08/21 1255 06/08/21 1300 06/08/21 1302 06/08/21 1336  BP: (!) 171/58 (!) 166/63 (!) 174/63 (!) 173/58  Pulse: 61 62 (!) 57 61  Resp: 20 (!) 25 19 17   Temp:   98.6 F (37 C)   TempSrc:      SpO2: 100% 96% 96% 98%  Weight:    89 kg  Height:    6'  (1.829 m)   No intake or output data in the 24 hours ending 06/08/21 1449 Filed Weights   06/08/21 1336  Weight: 89 kg    Exam:  General: 81 y.o. year-old male well developed well nourished in no acute distress.  Alert and oriented x3. Cardiovascular: Regular rate and rhythm with no rubs or gallops.  No thyromegaly or JVD noted.   Respiratory: Clear to auscultation with no wheezes or rales. Good inspiratory effort. Abdomen: Soft nontender nondistended with normal bowel sounds x4 quadrants. Musculoskeletal: Trace lower extremity edema bilaterally left greater than right. Skin: Left second toe wound with greenish drainage Psychiatry: Mood is appropriate for condition and setting   Data Reviewed: CBC: Recent Labs  Lab 06/03/21 1602 06/05/21 0741 06/07/21 1530 06/08/21 0601  WBC 7.6 4.2 5.0 5.6  NEUTROABS  --  2.7 3.3 4.4  HGB 10.2* 9.4* 9.3* 10.1*  HCT 31.7* 28.8* 28.0* 31.4*  MCV 96.9 97.0 95.2 96.3  PLT 241 169 182 989   Basic Metabolic Panel: Recent Labs  Lab 06/03/21 1602 06/05/21 0741 06/07/21 1530 06/08/21 0253  NA 134* 135 130* 133*  K 3.9 4.2 4.7 4.7  CL 93* 96* 93* 92*  CO2 29 28 24 24   GLUCOSE 77 84 75 47*  BUN 11 8 20 23   CREATININE 2.90* 2.74* 4.25* 4.41*  CALCIUM 8.5* 8.8* 8.5* 8.6*  MG  --   --  1.9 2.0  PHOS  --   --   --  3.3   GFR: Estimated Creatinine Clearance: 14.9 mL/min (A) (by C-G formula based on SCr of 4.41 mg/dL (H)). Liver Function Tests: Recent Labs  Lab 06/07/21 1530 06/08/21 0253  AST 30 29  ALT 15 14  ALKPHOS 106 111  BILITOT 0.8 0.9  PROT 5.4* 5.9*  ALBUMIN 2.5* 2.6*   No results for input(s): LIPASE, AMYLASE in the last 168 hours. No results for input(s): AMMONIA in the last 168 hours. Coagulation Profile: No results for input(s): INR, PROTIME in the last 168 hours. Cardiac Enzymes: No results for input(s): CKTOTAL, CKMB, CKMBINDEX, TROPONINI in the last 168 hours. BNP (last 3 results) No results for input(s):  PROBNP in the last 8760 hours. HbA1C: No results for input(s): HGBA1C in the last 72 hours. CBG: Recent Labs  Lab 06/03/21 1602 06/07/21 1526 06/08/21 0611 06/08/21 0814 06/08/21 1210  GLUCAP 63* 81 73 102* 110*   Lipid Profile: No results for input(s): CHOL, HDL, LDLCALC, TRIG, CHOLHDL, LDLDIRECT in the last 72 hours. Thyroid Function Tests: Recent Labs    06/08/21 0254  TSH 2.640   Anemia Panel: No results for input(s): VITAMINB12, FOLATE, FERRITIN, TIBC, IRON, RETICCTPCT in the last 72 hours. Urine analysis:    Component Value Date/Time   COLORURINE YELLOW 06/03/2021 1703   APPEARANCEUR HAZY (A) 06/03/2021 1703   LABSPEC 1.010 06/03/2021 1703   PHURINE 9.0 (H) 06/03/2021 1703   GLUCOSEU NEGATIVE 06/03/2021 1703   HGBUR LARGE (A) 06/03/2021 1703   BILIRUBINUR NEGATIVE 06/03/2021 1703   KETONESUR NEGATIVE 06/03/2021 1703   PROTEINUR 100 (A) 06/03/2021 1703   NITRITE NEGATIVE 06/03/2021 1703   LEUKOCYTESUR NEGATIVE 06/03/2021 1703   Sepsis Labs: @LABRCNTIP (procalcitonin:4,lacticidven:4)  ) Recent Results (from the past 240 hour(s))  Resp Panel by RT-PCR (Flu  A&B, Covid) Nasopharyngeal Swab     Status: Abnormal   Collection Time: 06/03/21  6:13 PM   Specimen: Nasopharyngeal Swab; Nasopharyngeal(NP) swabs in vial transport medium  Result Value Ref Range Status   SARS Coronavirus 2 by RT PCR POSITIVE (A) NEGATIVE Final    Comment: (NOTE) SARS-CoV-2 target nucleic acids are DETECTED.  The SARS-CoV-2 RNA is generally detectable in upper respiratory specimens during the acute phase of infection. Positive results are indicative of the presence of the identified virus, but do not rule out bacterial infection or co-infection with other pathogens not detected by the test. Clinical correlation with patient history and other diagnostic information is necessary to determine patient infection status. The expected result is Negative.  Fact Sheet for  Patients: EntrepreneurPulse.com.au  Fact Sheet for Healthcare Providers: IncredibleEmployment.be  This test is not yet approved or cleared by the [place] FDA and  has been authorized for detection and/or diagnosis of SARS-CoV-2 by FDA under an Emergency Use Authorization (EUA).  This EUA will remain in effect (meaning this test can be used) for the duration of  the COVID-19 declaration under Section 564(b)(1) of the A ct, 21 U.S.C. section 360bbb-3(b)(1), unless the authorization is terminated or revoked sooner.     Influenza A by PCR NEGATIVE NEGATIVE Final   Influenza B by PCR NEGATIVE NEGATIVE Final    Comment: (NOTE) The Xpert Xpress SARS-CoV-2/FLU/RSV plus assay is intended as an aid in the diagnosis of influenza from Nasopharyngeal swab specimens and should not be used as a sole basis for treatment. Nasal washings and aspirates are unacceptable for Xpert Xpress SARS-CoV-2/FLU/RSV testing.  Fact Sheet for Patients: EntrepreneurPulse.com.au  Fact Sheet for Healthcare Providers: IncredibleEmployment.be  This test is not yet approved or cleared by the [place] FDA and has been authorized for detection and/or diagnosis of SARS-CoV-2 by FDA under an Emergency Use Authorization (EUA). This EUA will remain in effect (meaning this test can be used) for the duration of the COVID-19 declaration under Section 564(b)(1) of the Act, 21 U.S.C. section 360bbb-3(b)(1), unless the authorization is terminated or revoked.  Performed at [facility], [place] [address] [place], [place] [address]   Blood culture (routine x 2)     Status: None (Preliminary result)   Collection Time: 06/07/21 11:04 PM   Specimen: BLOOD  Result Value Ref Range Status   Specimen Description BLOOD RIGHT HAND  Final   Special Requests   Final    BOTTLES DRAWN AEROBIC AND ANAEROBIC Blood Culture results may not be optimal due  to an inadequate volume of blood received in culture bottles   Culture   Final    NO GROWTH < 24 HOURS Performed at [facility], [place] [address], [place], [place] [address]    Report Status PENDING  Incomplete  Blood culture (routine x 2)     Status: None (Preliminary result)   Collection Time: 06/08/21  6:01 AM   Specimen: BLOOD  Result Value Ref Range Status   Specimen Description BLOOD SITE NOT SPECIFIED  Final   Special Requests   Final    BOTTLES DRAWN AEROBIC ONLY Blood Culture results may not be optimal due to an inadequate volume of blood received in culture bottles   Culture   Final    NO GROWTH < 12 HOURS Performed at [facility], [place] [address] [place], [place] [address]    Report Status PENDING  Incomplete      Studies: CT Head  Wo Contrast  Result Date: 06/07/2021 CLINICAL DATA:  [place] today.  Possibly hit his head. EXAM: CT HEAD WITHOUT CONTRAST CT CERVICAL SPINE WITHOUT CONTRAST TECHNIQUE: Multidetector CT imaging of the head and cervical spine was performed following the standard protocol without intravenous contrast. Multiplanar CT image reconstructions of the cervical spine were also generated. RADIATION DOSE REDUCTION: This exam was performed according to the departmental dose-optimization program which includes automated exposure control, adjustment of the mA and/or kV according to patient size and/or use of iterative reconstruction technique. COMPARISON:  Head CT dated 06/03/2021. Head and cervical spine CT dated 05/01/2021. FINDINGS: CT HEAD FINDINGS Brain: No significant change in moderate to marked diffuse enlargement of the ventricles and subarachnoid spaces. Mild-to-moderate patchy white matter low density in both cerebral hemispheres without significant change. No intracranial hemorrhage, mass lesion or CT evidence of acute infarction. Vascular: No hyperdense vessel or unexpected calcification. Atheromatous arterial calcifications at the skull base.  Skull: Normal. Negative for fracture or focal lesion. Sinuses/Orbits: Status post right cataract extraction. Bilateral anterior ethmoid sinus mucosal thickening. Mild left maxillary sinus mucosal thickening. Other: None. CT CERVICAL SPINE FINDINGS Alignment: Stable reversal of the normal cervical lordosis. No subluxations. Skull base and vertebrae: No acute fracture. No primary bone lesion or focal pathologic process. Soft tissues and spinal canal: No prevertebral fluid or swelling. No visible canal hematoma. Disc levels:  Stable multilevel degenerative changes. Upper chest: Clear lung apices. Other: Bilateral carotid artery calcifications. IMPRESSION: 1. No skull fracture or intracranial hemorrhage. 2. No cervical spine fracture or subluxation. 3. Stable diffuse cerebral and cerebellar atrophy in chronic small vessel white matter ischemic changes. 4. Stable reversal of the normal cervical lordosis and multilevel cervical spine degenerative changes. 5. Bilateral carotid artery atheromatous calcifications. Electronically Signed   By: Claudie Revering M.D.   On: 06/07/2021 16:38   CT Cervical Spine Wo Contrast  Result Date: 06/07/2021 CLINICAL DATA:  [place] today.  Possibly hit his head. EXAM: CT HEAD WITHOUT CONTRAST CT CERVICAL SPINE WITHOUT CONTRAST TECHNIQUE: Multidetector CT imaging of the head and cervical spine was performed following the standard protocol without intravenous contrast. Multiplanar CT image reconstructions of the cervical spine were also generated. RADIATION DOSE REDUCTION: This exam was performed according to the departmental dose-optimization program which includes automated exposure control, adjustment of the mA and/or kV according to patient size and/or use of iterative reconstruction technique. COMPARISON:  Head CT dated 06/03/2021. Head and cervical spine CT dated 05/01/2021. FINDINGS: CT HEAD FINDINGS Brain: No significant change in moderate to marked diffuse enlargement of the ventricles  and subarachnoid spaces. Mild-to-moderate patchy white matter low density in both cerebral hemispheres without significant change. No intracranial hemorrhage, mass lesion or CT evidence of acute infarction. Vascular: No hyperdense vessel or unexpected calcification. Atheromatous arterial calcifications at the skull base. Skull: Normal. Negative for fracture or focal lesion. Sinuses/Orbits: Status post right cataract extraction. Bilateral anterior ethmoid sinus mucosal thickening. Mild left maxillary sinus mucosal thickening. Other: None. CT CERVICAL SPINE FINDINGS Alignment: Stable reversal of the normal cervical lordosis. No subluxations. Skull base and vertebrae: No acute fracture. No primary bone lesion or focal pathologic process. Soft tissues and spinal canal: No prevertebral fluid or swelling. No visible canal hematoma. Disc levels:  Stable multilevel degenerative changes. Upper chest: Clear lung apices. Other: Bilateral carotid artery calcifications. IMPRESSION: 1. No skull fracture or intracranial hemorrhage. 2. No cervical spine fracture or subluxation. 3. Stable diffuse cerebral and cerebellar atrophy in chronic small vessel white matter ischemic changes. 4.  Stable reversal of the normal cervical lordosis and multilevel cervical spine degenerative changes. 5. Bilateral carotid artery atheromatous calcifications. Electronically Signed   By: Claudie Revering M.D.   On: 06/07/2021 16:38   MR FOOT LEFT WO CONTRAST  Result Date: 06/08/2021 CLINICAL DATA:  Osteomyelitis, foot EXAM: MRI OF THE LEFT FOOT WITHOUT CONTRAST TECHNIQUE: Multiplanar, multisequence MR imaging of the left forefoot was performed. No intravenous contrast was administered. COMPARISON:  Left foot radiograph 06/07/2021 FINDINGS: Bones/Joint/Cartilage Prior great toe amputation. There is bony edema and confluent low T1 signal within the second toe proximal phalanx and mid to distal second metatarsal. The second toe proximal phalanx is dorsally  dislocated from the second metatarsal head. There is edema signal within the first and third metatarsal heads with preserved T1 signal. Similar findings but to a lesser degree in the fourth metatarsal head. There is a subacute fracture of the third metatarsal head/neck with mild angulation. Ligaments Surgically disrupted first MTP ligaments. Torn second MTP plantar plate and collateral ligaments. The third through fifth MTP collateral ligaments appear intact without evidence of plantar plate tear. The Lisfranc ligament appears intact. Muscles and Tendons Diffuse intramuscular edema and muscle atrophy in the foot as is commonly seen in diabetics. Soft tissues Diffuse soft tissue swelling of the foot. There is some localized fluid in the first webspace between the first and second metatarsal heads, not well-defined. 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 Dorsal dislocation of the second MTP joint. Bony edema in the adjacent first and third metatarsal heads which is favored to be reactive, though early osteomyelitis could have a similar appearance. Subacute third metatarsal head/neck fracture with mild angulation, accounting for some of the aforementioned bony edema. Localized fluid in the first webspace between the first and second metatarsal heads, not well-defined but could represent developing phlegmon/abscess. Reactive mild edema in the fourth metatarsal head. Electronically Signed   By: Maurine Simmering M.D.   On: 06/08/2021 09:28   DG Chest Portable 1 View  Result Date: 06/07/2021 CLINICAL DATA:  Concern for pneumonia EXAM: PORTABLE CHEST 1 VIEW COMPARISON:  Chest x-ray 06/03/2021 FINDINGS: Cardiomegaly and mediastinum are stable. Pulmonary vasculature appears within normal limits. No focal consolidation identified. Prominent chronic interstitial lung markings similar to previous. No significant pleural effusion visualized. No pneumothorax. IMPRESSION:  Cardiomegaly with no acute process identified. Electronically Signed   By: Ofilia Neas M.D.   On: 06/07/2021 16:12   DG Foot Complete Left  Result Date: 06/07/2021 CLINICAL DATA:  Left great toe amputation, EXAM: LEFT FOOT - COMPLETE 3+ VIEW COMPARISON:  05/13/2021 FINDINGS: Frontal, oblique, lateral views of the left foot are obtained. Prior amputation at the level of the first metatarsophalangeal joint. There is diffuse soft tissue swelling throughout the forefoot and remaining digits, as well as at the surgical site. On the lateral view, there is evidence of bony destruction at the base of the second proximal phalanx concerning for osteomyelitis. No other acute or destructive bony lesions. Stable osteoarthritis. Stable postsurgical changes of the left ankle. IMPRESSION: 1. Interval amputation at the first metatarsophalangeal joint. 2. Bony destruction at the base of the second proximal phalanx, best seen on lateral view, concerning for osteomyelitis. 3. Progressive soft tissue swelling throughout the left forefoot and remaining digits. Electronically Signed   By: Randa Ngo M.D.   On: 06/07/2021 19:20    Scheduled Meds:  amiodarone  200 mg Oral Daily   atorvastatin  20 mg Oral Daily  carvedilol  6.25 mg Oral BID   Chlorhexidine Gluconate Cloth  6 each Topical Q0600   dextrose  1 ampule Intravenous STAT   loratadine  10 mg Oral Daily   molnupiravir EUA  4 capsule Oral BID   pantoprazole  40 mg Oral Daily   sodium bicarbonate  650 mg Oral BID    Continuous Infusions:  cefTRIAXone (ROCEPHIN)  IV Stopped (06/07/21 2238)   [START ON 06/09/2021] vancomycin       LOS: 1 day     Kayleen Memos, MD [facility] Pager [phone]  If 7PM-7AM, please contact night-coverage [email or web address] Password [facility] 06/08/2021, 2:49 PM

## 2021-06-08 NOTE — Progress Notes (Addendum)
Pharmacy Antibiotic Note  Darryl Diaz is a 81 y.o. male admitted on 06/07/2021 with osteomyelitis. Pharmacy was consulted for vancomycin dosing yesterday. Pharmacy is now consulted to dose cefepime for osteomyelitis.  Pt has ESRD and on iHD MWF (last HD was 1/27, missed yesterday; per HD RN, pt in HD now for shortened 3-hr session, due to staffing); per provider note, pt still makes urine  Pt rec'd vancomycin 1750 mg IV X at ~2330  WBC 5.6, afebrile  Plan: F/U HD schedule; administer vancomycin 1 gram IV with each HD; will give vancomycin 750 mg IV X 1 with HD today since pt receiving 3-hr session today) and ck pre-HD vancomycin level prior to next HD session to evaluate vancomycin clearance since pt still making urine Cefepime 1 gm IV Q 24 hrs (give dose every day; on HD days, give dose on nursing unit after pt returns from HD) Monitor WBC, temp, clinical course F/U cultures results and de-escalate as appropriate  Height: 6' (182.9 cm) Weight: 89 kg (196 lb 3.4 oz) IBW/kg (Calculated) : 77.6  Temp (24hrs), Avg:98.4 F (36.9 C), Min:98.1 F (36.7 C), Max:98.6 F (37 C)  Recent Labs  Lab 06/03/21 1602 06/05/21 0741 06/07/21 1530 06/08/21 0253 06/08/21 0601  WBC 7.6 4.2 5.0  --  5.6  CREATININE 2.90* 2.74* 4.25* 4.41*  --   LATICACIDVEN 2.5*  --   --   --   --      Estimated Creatinine Clearance: 14.9 mL/min (A) (by C-G formula based on SCr of 4.41 mg/dL (H)).    Allergies  Allergen Reactions   Penicillins Rash   Antimicrobials This Admission: Vancomycin 1/30 >> Cefepime 1/31 >> Ceftriaxone 1/30  Metronidazole IV 1/31 >> Molnupiravir 1/30 >> (2/2)  Microbiology Data: 1/31 HBV surface Ag: negative 1/31 Bld cx X 2: NGTD  Thank you for allowing pharmacy to be a part of this patients care.  Gillermina Hu, PharmD, BCPS, [facility] Clinical Pharmacist

## 2021-06-08 NOTE — ED Notes (Signed)
Pt had a loose BM.

## 2021-06-09 DIAGNOSIS — I129 Hypertensive chronic kidney disease with stage 1 through stage 4 chronic kidney disease, or unspecified chronic kidney disease: Secondary | ICD-10-CM | POA: Diagnosis not present

## 2021-06-09 DIAGNOSIS — M86172 Other acute osteomyelitis, left ankle and foot: Secondary | ICD-10-CM | POA: Diagnosis not present

## 2021-06-09 DIAGNOSIS — N186 End stage renal disease: Secondary | ICD-10-CM | POA: Diagnosis not present

## 2021-06-09 DIAGNOSIS — M86272 Subacute osteomyelitis, left ankle and foot: Secondary | ICD-10-CM | POA: Diagnosis not present

## 2021-06-09 DIAGNOSIS — U071 COVID-19: Secondary | ICD-10-CM | POA: Diagnosis not present

## 2021-06-09 DIAGNOSIS — Z992 Dependence on renal dialysis: Secondary | ICD-10-CM | POA: Diagnosis not present

## 2021-06-09 LAB — CBC
HCT: 29.5 % — ABNORMAL LOW (ref 39.0–52.0)
Hemoglobin: 9.4 g/dL — ABNORMAL LOW (ref 13.0–17.0)
MCH: 30.6 pg (ref 26.0–34.0)
MCHC: 31.9 g/dL (ref 30.0–36.0)
MCV: 96.1 fL (ref 80.0–100.0)
Platelets: 157 10*3/uL (ref 150–400)
RBC: 3.07 MIL/uL — ABNORMAL LOW (ref 4.22–5.81)
RDW: 16.2 % — ABNORMAL HIGH (ref 11.5–15.5)
WBC: 4.2 10*3/uL (ref 4.0–10.5)
nRBC: 0 % (ref 0.0–0.2)

## 2021-06-09 LAB — RENAL FUNCTION PANEL
Albumin: 2.3 g/dL — ABNORMAL LOW (ref 3.5–5.0)
Anion gap: 12 (ref 5–15)
BUN: 23 mg/dL (ref 8–23)
CO2: 23 mmol/L (ref 22–32)
Calcium: 8.2 mg/dL — ABNORMAL LOW (ref 8.9–10.3)
Chloride: 100 mmol/L (ref 98–111)
Creatinine, Ser: 4.23 mg/dL — ABNORMAL HIGH (ref 0.61–1.24)
GFR, Estimated: 14 mL/min — ABNORMAL LOW (ref >60)
Glucose, Bld: 125 mg/dL — ABNORMAL HIGH (ref 70–99)
Phosphorus: 3 mg/dL (ref 2.5–4.6)
Potassium: 3.5 mmol/L (ref 3.5–5.1)
Sodium: 135 mmol/L (ref 135–145)

## 2021-06-09 LAB — HEPATITIS B SURFACE ANTIBODY, QUANTITATIVE: Hep B S AB Quant (Post): 4.7 m[IU]/mL — ABNORMAL LOW (ref >9.9)

## 2021-06-09 LAB — VITAMIN B12: Vitamin B-12: 954 pg/mL — ABNORMAL HIGH (ref 180–914)

## 2021-06-09 MED ORDER — VITAMIN B-12 1000 MCG PO TABS
500.0000 ug | ORAL_TABLET | Freq: Every day | ORAL | Status: DC
  Administered 2021-06-09 – 2021-06-14 (×6): 500 ug via ORAL
  Filled 2021-06-09 (×6): qty 1

## 2021-06-09 MED ORDER — IPRATROPIUM-ALBUTEROL 0.5-2.5 (3) MG/3ML IN SOLN
3.0000 mL | Freq: Four times a day (QID) | RESPIRATORY_TRACT | Status: DC
  Administered 2021-06-09 – 2021-06-10 (×4): 3 mL via RESPIRATORY_TRACT
  Filled 2021-06-09 (×4): qty 3

## 2021-06-09 MED ORDER — CHLORHEXIDINE GLUCONATE CLOTH 2 % EX PADS
6.0000 | MEDICATED_PAD | Freq: Every day | CUTANEOUS | Status: DC
  Administered 2021-06-09 – 2021-06-12 (×4): 6 via TOPICAL

## 2021-06-09 MED ORDER — VANCOMYCIN HCL 750 MG/150ML IV SOLN
750.0000 mg | INTRAVENOUS | Status: DC
  Administered 2021-06-11 – 2021-06-14 (×2): 750 mg via INTRAVENOUS
  Filled 2021-06-09 (×5): qty 150

## 2021-06-09 MED ORDER — SODIUM CHLORIDE 0.9 % IV SOLN: 2.0000 g | INTRAVENOUS | Status: DC

## 2021-06-09 NOTE — Care Plan (Signed)
RN instructor & student went into room to hang an IVPB for the patient.  The line in the room did not have a sterile cap on it and had been contaminated.  The old tubing was thrown away.  Student primed & hung a new primary line and was getting ready to spike the antibiotic when it spilled.  A message was sent to pharmacy requesting a new bag of antibiotic.  Instructor & student will hang the IVPB when it comes up from pharmacy.  Lorn Junes, MA, BA, BSN, RN Clinical Nursing Instructor @ [facility]

## 2021-06-09 NOTE — Progress Notes (Addendum)
Progress Note   Patient: Darryl Diaz MBT:[id number] DOB: [name] 30, 1941 DOA: 06/07/2021     2 DOS: the patient was seen and examined on 06/09/2021   Brief hospital course: Darryl Diaz is a 81 y.o. 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  Covid-19 screening test positive on 06/03/21, started on antiviral Molnupiravir on admission.  On broad spectrum IV antibiotics for ?left foot osteomyelitis.  Seen by orthopedic surgery, no plan for surgical intervention at this time.  Infectious disease consulted to assist with the management.   06/09/2021: Patient was seen at his bedside.  No significant pain in his left foot.  He is on broad-spectrum IV antibiotics.  Will be seen by infectious disease for management of his left foot cellulitis/?Osteomyelitis.  Assessment and Plan: No notes have been filed under this hospital service. 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 Sharol Given outpatient. On admission received IV vancomycin and Rocephin.  Continue IV vancomycin, broadened abx with Cefepime and IV flagyl, will narrow down when cultures result.  Follow blood cultures x 2, negative to date. Continue to monitor fever curve and WBC Orthopedic surgery consulted, Dr.Duda, no plan for surgical intervention at this time. Pain regimen along with bowel regimen. ID consulted, may benefit from ID follow-up outpatient, appreciate ID and orthopedic surgery' assistance.    Frequent falls with imbalance of unclear:  Multiple ground-level falls Obtain vitamin B12 level, follow results Start p.o. vitamin B12 supplement. PT/OT assessment and fall precautions Avoid benzodiazepine and opiates as able   Recent COVID-19 positive finding:  Covid-19 Positive 06/03/21 CXR cardiomegaly with no acute cardiopulmonary process. O2 sat 98% RA Trend inflammatory markers Molnupiravir, started 06/07/21 Bronchodilators  Mild hypervolemic hyponatremia Serum sodium 133 from 130 Volume status and electrolytes addressed with hemodialysis   Generalized weakness/Physical debility:  Likely contributed by acute viral illness in the setting of multiple comorbidities. PT OT to assess with fall precautions Encourage increase in oral protein calorie intake   ESRD on HD MWF HD on 06/08/2021 Appreciate nephrology's assistance.   Paroxysmal atrial fibrillation, not on oral anticoagulation:  CHA2DS2-VASc score of 6, there is an indication for chronic anticoagulation for thromboembolic prophylaxis.  However, in the setting of a history of gastrointestinal bleed, the patient is not currently on any chronic anticoagulation. Continue home Coreg and amiodarone.  Continue to monitor on telemetry   GERD:  Continue home PPI   Essential Hypertension:  BP is not at goal, elevated.   Continue home oral antihypertensives  Monitor vital signs  IV antihypertensive as needed with parameters.     Anemia of chronic disease:  Hemoglobin 10.1, stable and uptrending from 1.3. Continue to monitor H&H   Physical debility PT OT to assess Fall precautions       DVT prophylaxis: SCD's   Code Status: DNR, as confirmed per my discussions with the patient this evening Family Communication: none Disposition Plan: Likely will return to home after orthopedic surgery's consult. Consults called: EDP discussed patient's case with on-call nephrology, as further detailed  above; orthopedic surgery,  infectious disease. Admission status: Inpatient; PCU          Physical Exam: Vitals:   06/08/21 1941 06/09/21 0431 06/09/21 0659 06/09/21 0836  BP: 129/90 (!) 163/48  (!) 176/84  Pulse: 81 62  60  Resp: 16 20  19   Temp: (!) 97.2 F (36.2 C) 97.7 F (36.5 C)  98 F (36.7 C)  TempSrc: Oral Oral  Oral  SpO2: 99% 99%  100%  Weight:   89.5 kg   Height:       Exam:   General: 81 y.o. year-old male well developed well nourished in no acute distress.  Alert and oriented x3. Cardiovascular: Regular rate and rhythm no rubs or gallops. Respiratory: Clear to auscultation no wheeze or rales.   Abdomen: Soft nontender nondistended with normal bowel sounds x4 quadrants. Musculoskeletal: Trace lower extremity edema bilaterally left greater than right. Skin: Left second toe wound  Psychiatry: Mood is appropriate for condition and setting.       Data Reviewed:   Family Communication: None at bedside.  Disposition: Status is: Inpatient  Patient requires at least 2 midnights for further evaluation and treatment.  Condition.   Time spent: 50 minutes  Author: Kayleen Memos, DO 06/09/2021 9:56 AM  For on call review [email or web address].

## 2021-06-09 NOTE — Evaluation (Addendum)
Physical Therapy Evaluation Patient Details Name: Darryl Diaz MRN: [id number] DOB: [birth year] Today's Date: 06/09/2021  History of Present Illness  Pt is a 81 y.o. 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 Pt with (+) COVID-19 on 1/26. 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   Clinical Impression  Pt presents with an overall decrease in functional mobility secondary to above. PTA, pt resident at [facility], ambulatory with rollator and intermittent assist from staff for ADL/iADLs. Of note, recent L great toe amputation to be NWB/TDWB, which pt reports he has not been keeping. Today, pt received in bathroom, ambulating with L foot WBAT with RW and min guard. Increased time discussing importance of NWB precautions, to which pt reports, "I'm almost 81... I'm going to walk as long as I can." Pt reports no interested in wheelchair-level/transfers only mobility; suspect hopping on R foot would significant increase fall risk. Pt also not interested in ALF-level of care despite recommendations; he hopes to stay in ILF as long as able. This all being the case, will continue attempts to encourage LLE NWB strategies as able, but suspect pt is going to continue ambulating with L foot WBAT per his preference. Will continue to follow acutely to address established goals.   Recommendations for follow up therapy are one component of a multi-disciplinary discharge planning process, led by the attending physician.  Recommendations may be updated based on patient status, additional functional criteria and insurance authorization.  Follow Up Recommendations Home health PT (would benefit from ALF, but pt reports desire to stay in ILF)    Assistance Recommended  at Discharge Intermittent Supervision/Assistance  Patient can return home with the following  A little help with walking and/or transfers;A little help with bathing/dressing/bathroom;Assistance with cooking/housework;Direct supervision/assist for medications management;Direct supervision/assist for financial management;Help with stairs or ramp for entrance;Assist for transportation    Equipment Recommendations None recommended by PT (not interested in w/c to keep NWB precautions)  Recommendations for Other Services       Functional Status Assessment Patient has had a recent decline in their functional status and demonstrates the ability to make significant improvements in function in a reasonable and predictable amount of time.     Precautions / Restrictions Precautions Precautions: Fall Required Braces or Orthoses: Other Brace Other Brace: has post-op darco shoe at home Restrictions Weight Bearing Restrictions: Yes LLE Weight Bearing: Non weight bearing Other Position/Activity Restrictions: reviewed NWB precautions with pt - pt reports he does not intend to keep L foot NWB, he plans to walk      Mobility  Bed Mobility               General bed mobility comments: received sitting on toilet    Transfers Overall transfer level: Needs assistance Equipment used: Rolling walker (2 wheels) Transfers: Sit to/from Stand Sit to Stand: Min guard   Step pivot transfers: Min guard       General transfer comment: heavy reliance on grab bar to pull to standing from low toilet height; additional 3x sit<>stand from recliner and toilet due to need to return to bathroom; min guard for balance    Ambulation/Gait Ambulation/Gait assistance: Min guard Gait Distance (Feet): 30 Feet Assistive device: Rolling walker (2 wheels) Gait Pattern/deviations: Step-to pattern, Decreased stride  length, Trunk flexed Gait velocity: Decreased     General Gait Details: Pt not interested in  attempting LLE NWB precautions, ambulates with RW and L foot WBAT, min guard for balance; pt ambulating to/from bathroom, deferred further distance secondary to not maintaining WB precautions  Stairs            Wheelchair Mobility    Modified Rankin (Stroke Patients Only)       Balance Overall balance assessment: Needs assistance Sitting-balance support: No upper extremity supported, Feet supported Sitting balance-Leahy Scale: Good     Standing balance support: No upper extremity supported, During functional activity Standing balance-Leahy Scale: Fair Standing balance comment: can static stand without UE support; static and dynamic stability improved with RW                             Pertinent Vitals/Pain Pain Assessment Pain Assessment: No/denies pain    Home Living Family/patient expects to be discharged to:: Assisted living                 Home Equipment: Rolling Walker (2 wheels);Rollator (4 wheels);Wheelchair - manual;Shower seat - built in;Adaptive equipment;Hand held shower head;Shower seat Additional Comments: Pt resident at [facility]    Prior Function Prior Level of Function : Independent/Modified Independent;History of Falls (last six months)             Mobility Comments: Pt reports mod indep ambulating with rollator; has not been maintaining L foot TDWB/NWB precautions, has been fully ambulatory ADLs Comments: was sponge bathing, reports getting in the shower ~2 weeks ago with assist of [facility] staff     Hand Dominance        Extremity/Trunk Assessment   Upper Extremity Assessment Upper Extremity Assessment: Generalized weakness    Lower Extremity Assessment Lower Extremity Assessment: Generalized weakness (L foot osteomyelitis wrapped, h/o recent L great toe amputation)    Cervical / Trunk Assessment Cervical / Trunk Assessment: Kyphotic  Communication   Communication: No difficulties  Cognition  Arousal/Alertness: Awake/alert Behavior During Therapy: WFL for tasks assessed/performed Overall Cognitive Status: No family/caregiver present to determine baseline cognitive functioning Area of Impairment: Memory, Safety/judgement, Awareness                     Memory: Decreased recall of precautions   Safety/Judgement: Decreased awareness of safety, Decreased awareness of deficits Awareness: Emergent   General Comments: suspect near baseline cognition        General Comments General comments (skin integrity, edema, etc.): Increased time discussing importance of L foot NWB precautions and decreased wound healing that WB can cause - pt reports he plans to WBAT through LLE so he can walk as long as he's able; pt not interested in w/c use. Also discussed safety at home with falls, pt not interested in higher level of care at "ALF", he wants to stay in ILF as long as possible    Exercises     Assessment/Plan    PT Assessment Patient needs continued PT services  PT Problem List Decreased activity tolerance;Decreased balance;Decreased mobility;Decreased knowledge of use of DME;Decreased knowledge of precautions;Decreased strength;Decreased safety awareness       PT Treatment Interventions DME instruction;Gait training;Functional mobility training;Therapeutic activities;Therapeutic exercise;Balance training;Cognitive remediation;Patient/family education    PT Goals (Current goals can be found in the Care Plan section)  Acute Rehab PT Goals Patient Stated Goal: "I'm almost 81... I'm going to walk as long  as I can" PT Goal Formulation: With patient Time For Goal Achievement: 06/23/21 Potential to Achieve Goals: Fair    Frequency Min 3X/week     Co-evaluation               AM-PAC PT "6 Clicks" Mobility  Outcome Measure Help needed turning from your back to your side while in a flat bed without using bedrails?: A Little Help needed moving from lying on your back to  sitting on the side of a flat bed without using bedrails?: A Little Help needed moving to and from a bed to a chair (including a wheelchair)?: A Little Help needed standing up from a chair using your arms (e.g., wheelchair or bedside chair)?: A Little Help needed to walk in hospital room?: A Little Help needed climbing 3-5 steps with a railing? : A Little 6 Click Score: 18    End of Session Equipment Utilized During Treatment: Gait belt Activity Tolerance: Patient tolerated treatment well Patient left: in chair;with call bell/phone within reach;with chair alarm set Nurse Communication: Mobility status PT Visit Diagnosis: Other abnormalities of gait and mobility (R26.89);History of falling (Z91.81)    Time: 2536-6440 PT Time Calculation (min) (ACUTE ONLY): 19 min   Charges:   PT Evaluation $PT Eval Moderate Complexity: [place], PT, DPT Acute Rehabilitation Services  Pager [phone] Office [place] 06/09/2021, 1:12 PM

## 2021-06-09 NOTE — Consult Note (Signed)
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 male with history of afib, hx of ETOH cirrhosis, ESRD on HD, HTN, PAD, who underwent left great toe amputation on 1/10 for chronic nonhealing ulcer that developed into osteomyelitis. He lives by himself in 2 bedroom at [facility]. He was seen in the ED on 1/26 for near syncope and cough, found to have covid and given 5 days of molnupirvarir. He then was admitted on 1/28 - with a ground level fall, no LOC, but subscribed general weakness. He did undergo left foot xray that showed progress soft tissue swelling nad possible 2nd proximal phalynx changes concerning for osteo. Patient was started on renal dosing of vanco, cefepime plus oral metronidazole.  ------------------------------------- --IMPRESSION: Osteomyelitis of the second toe proximal phalanx and mid to distal second metatarsal. Dorsal dislocation of the second MTP joint.   Bony edema in the adjacent first and third metatarsal heads which is favored to be reactive, though early osteomyelitis could have a similar appearance.   Subacute third metatarsal head/neck fracture with mild angulation, accounting for some of the aforementioned bony edema.   Localized fluid in the first webspace between the first and second metatarsal heads, not well-defined but could represent developing phlegmon/abscess.   Reactive mild edema in the fourth metatarsal head.  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/17/2020   Alcoholism ([place])    in remission following wife's death   Anemia    Ascending aorta dilation (HCC)    Atrial fibrillation (HCC)    Cardiac arrest ([place]) 04/20/2019   12 min CPR with epi   Chronic diastolic CHF (congestive heart failure) ([place])    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[place])    Mon Wed Fri   Fatty liver    Gastritis    GERD (gastroesophageal reflux disease)    H/O seasonal allergies    History of blood transfusion    History of GI bleed    Hx of adenomatous colonic polyps 08/10/2017   Hyperlipidemia    Hypertension    Insomnia    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   Mallory-Weiss tear    OA (osteoarthritis)    hands   OSA (obstructive sleep apnea)    No longer uses CPAP   Persistent atrial fibrillation with rapid ventricular response ([place]) 02/05/2020   Recurrent syncope    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(HCC)     Allergies:  Allergies  Allergen Reactions   Penicillins Rash     MEDICATIONS:  amiodarone  200 mg Oral Daily   atorvastatin  20 mg Oral Daily   carvedilol  6.25 mg Oral BID   Chlorhexidine Gluconate Cloth  6 each Topical Q0600   ipratropium-albuterol  3 mL Nebulization Q6H   loratadine  10 mg Oral Daily   molnupiravir EUA  4 capsule Oral BID   pantoprazole  40 mg Oral Daily   sodium bicarbonate  650 mg Oral BID   vitamin B-12  500 mcg Oral Daily    Social History   Tobacco Use   Smoking status: Former    Packs/day: 1.00    Years: 20.00    Pack years: 20.00    Types: Cigarettes   Smokeless tobacco: Never   Tobacco comments:       quit 20 years  smoked on and off for 20 years  Vaping Use   Vaping Use: Never used  Substance Use Topics   Alcohol use: Not Currently    Alcohol/week: 3.0 standard drinks    Types: 3 Glasses of wine per week    Comment: h/o heavy use   Drug use: Never    Family History  Problem Relation Age of Onset   Heart disease Mother 58   Hypertension  Mother    Arthritis Mother    Heart failure Father 5   Stroke Maternal Aunt    Heart failure Sister    Colon cancer Neg Hx    Esophageal cancer Neg Hx    Stomach cancer Neg Hx    Rectal cancer Neg Hx      Review of Systems  Constitutional: Negative for fever, chills, diaphoresis, activity change, appetite change, fatigue and unexpected weight change.  HENT: Negative for congestion, sore throat, rhinorrhea, sneezing, trouble swallowing and sinus pressure.  Eyes: Negative for photophobia and visual disturbance.  Respiratory: Negative for cough, chest tightness, shortness of breath, wheezing and stridor.  Cardiovascular: Negative for chest pain, palpitations and leg swelling.  Gastrointestinal: Negative for nausea, vomiting, abdominal pain, diarrhea, constipation, blood in stool, abdominal distention and anal bleeding.  Genitourinary: Negative for dysuria, hematuria, flank pain and difficulty urinating.  Musculoskeletal: Negative for myalgias, back pain, joint swelling, arthralgias and gait problem.  Skin: Negative for color change, pallor, rash and wound.  Neurological: Negative for dizziness, tremors, weakness and light-headedness.  Hematological: Negative for adenopathy. Does not bruise/bleed easily.  Psychiatric/Behavioral: Negative for behavioral problems, confusion, sleep disturbance, dysphoric mood, decreased concentration and agitation.    OBJECTIVE: Temp:  [97.2 F (36.2 C)-98.2 F (36.8 C)] 97.7 F (36.5 C) (02/01 1137) Pulse Rate:  [60-85] 60 (02/01 0836) Resp:  [16-21] 19 (02/01 0836) BP: (122-176)/(48-90) 122/55 (02/01 1137) SpO2:  [97 %-100 %] 99 % (02/01 1439) Weight:  [89.5 kg] 89.5 kg (02/01 0659) Physical Exam  Constitutional: He is oriented to person, place, and time. He appears well-developed and well-nourished. No distress.  HENT:  Mouth/Throat: Oropharynx is clear and moist. No oropharyngeal exudate.  Cardiovascular: Normal rate, regular rhythm and normal  heart sounds. Exam reveals no gallop and no friction rub.  No murmur heard.  Chest: hd catheter place Pulmonary/Chest: Effort normal and breath sounds normal. No respiratory distress. He has no wheezes.  Abdominal: Soft. Bowel sounds are normal. He exhibits no distension. There is no tenderness.  Ext: left foot wrapped Neurological: He is alert and oriented to person, place, and time.  Skin: Skin is warm and dry. No rash noted. No erythema.  Psychiatric: He has a normal mood and affect. His behavior is normal.    LABS: Results for orders placed or performed during the hospital encounter of 06/07/21 (from the past 48 hour(s))  Comprehensive metabolic panel     Status: Abnormal   Collection Time: 06/07/21  3:30 PM  Result Value Ref Range   Sodium 130 (L) 135 - 145 mmol/L   Potassium 4.7 3.5 - 5.1 mmol/L   Chloride 93 (L) 98 -  111 mmol/L   CO2 24 22 - 32 mmol/L   Glucose, Bld 75 70 - 99 mg/dL    Comment: Glucose reference range applies only to samples taken after fasting for at least 8 hours.   BUN 20 8 - 23 mg/dL   Creatinine, Ser 4.25 (H) 0.61 - 1.24 mg/dL   Calcium 8.5 (L) 8.9 - 10.3 mg/dL   Total Protein 5.4 (L) 6.5 - 8.1 g/dL   Albumin 2.5 (L) 3.5 - 5.0 g/dL   AST 30 15 - 41 U/L   ALT 15 0 - 44 U/L   Alkaline Phosphatase 106 38 - 126 U/L   Total Bilirubin 0.8 0.3 - 1.2 mg/dL   GFR, Estimated 13 (L) >60 mL/min    Comment: (NOTE) Calculated using the CKD-EPI Creatinine Equation (2021)    Anion gap 13 5 - 15    Comment: Performed at [facility], [place] [address], [place], [place] [address]  CBC with Differential     Status: Abnormal   Collection Time: 06/07/21  3:30 PM  Result Value Ref Range   WBC 5.0 4.0 - 10.5 K/uL   RBC 2.94 (L) 4.22 - 5.81 MIL/uL   Hemoglobin 9.3 (L) 13.0 - 17.0 g/dL   HCT 28.0 (L) 39.0 - 52.0 %   MCV 95.2 80.0 - 100.0 fL   MCH 31.6 26.0 - 34.0 pg   MCHC 33.2 30.0 - 36.0 g/dL   RDW 15.6 (H) 11.5 - 15.5 %   Platelets 182 150 - 400 K/uL   nRBC  0.0 0.0 - 0.2 %   Neutrophils Relative % 66 %   Neutro Abs 3.3 1.7 - 7.7 K/uL   Lymphocytes Relative 24 %   Lymphs Abs 1.2 0.7 - 4.0 K/uL   Monocytes Relative 10 %   Monocytes Absolute 0.5 0.1 - 1.0 K/uL   Eosinophils Relative 0 %   Eosinophils Absolute 0.0 0.0 - 0.5 K/uL   Basophils Relative 0 %   Basophils Absolute 0.0 0.0 - 0.1 K/uL   Immature Granulocytes 0 %   Abs Immature Granulocytes 0.02 0.00 - 0.07 K/uL    Comment: Performed at [facility], [address], [place], [place] [address]  Magnesium     Status: None   Collection Time: 06/07/21  3:30 PM  Result Value Ref Range   Magnesium 1.9 1.7 - 2.4 mg/dL    Comment: Performed at [facility], [place] [address], [place], [place] [address]  Blood culture (routine x 2)     Status: None (Preliminary result)   Collection Time: 06/07/21 11:04 PM   Specimen: BLOOD  Result Value Ref Range   Specimen Description BLOOD RIGHT HAND    Special Requests      BOTTLES DRAWN AEROBIC AND ANAEROBIC Blood Culture results may not be optimal due to an inadequate volume of blood received in culture bottles   Culture      NO GROWTH 2 DAYS Performed at [facility] [address], [place], [place] [address]    Report Status PENDING   C-reactive protein     Status: Abnormal   Collection Time: 06/07/21 11:04 PM  Result Value Ref Range   CRP 4.1 (H) <1.0 mg/dL    Comment: Performed at [facility], [place] [address], [place],  [address]  Sedimentation rate     Status: Abnormal   Collection Time: 06/07/21 11:04 PM  Result Value Ref Range   Sed Rate 25 (  H) 0 - 16 mm/hr    Comment: Performed at [facility], [place] [address] [place], [place] [address]  Hepatitis B surface antibody     Status: Abnormal   Collection Time: 06/07/21 11:04 PM  Result Value Ref Range   Hep B S Ab Reactive (A) NON REACTIVE    Comment: (NOTE) Consistent with immunity, greater than 9.9 mIU/mL.  Performed at [facility], [place] [address], [place], [place] [address]   Hepatitis B surface antibody,quantitative     Status: Abnormal   Collection Time: 06/07/21 11:04 PM  Result Value Ref Range   Hepatitis B-Post 4.7 (L) Immunity>9.9 mIU/mL    Comment: (NOTE)  Status of Immunity                     Anti-HBs Level  ------------------                     -------------- Inconsistent with Immunity                   0.0 - 9.9 Consistent with Immunity                          >9.9 Performed At: [facility] [place], [place] [id number] Rush Farmer MD EX:[id number]   Phosphorus     Status: None   Collection Time: 06/08/21  2:53 AM  Result Value Ref Range   Phosphorus 3.3 2.5 - 4.6 mg/dL    Comment: Performed at [facility], [place] [address] [place], [place] [address]  Magnesium     Status: None   Collection Time: 06/08/21  2:53 AM  Result Value Ref Range   Magnesium 2.0 1.7 - 2.4 mg/dL    Comment: Performed at [facility], [place] [address] [place], [place] [address]  Comprehensive metabolic panel     Status: Abnormal   Collection Time: 06/08/21  2:53 AM  Result Value Ref Range   Sodium 133 (L) 135 - 145 mmol/L   Potassium 4.7 3.5 - 5.1 mmol/L   Chloride 92 (L) 98 - 111 mmol/L   CO2 24 22 - 32 mmol/L   Glucose, Bld 47 (L) 70 - 99 mg/dL    Comment: Glucose reference range applies only to samples taken after fasting for at least 8 hours.   BUN 23 8 - 23 mg/dL   Creatinine, Ser 4.41 (H) 0.61 - 1.24 mg/dL   Calcium 8.6 (L) 8.9 - 10.3 mg/dL   Total Protein 5.9 (L) 6.5 - 8.1 g/dL   Albumin 2.6 (L) 3.5 - 5.0 g/dL   AST 29 15 - 41 U/L   ALT 14 0 - 44 U/L   Alkaline Phosphatase 111 38 - 126 U/L   Total Bilirubin 0.9 0.3 - 1.2 mg/dL   GFR, Estimated 13 (L) >60 mL/min    Comment: (NOTE) Calculated using the CKD-EPI Creatinine Equation (2021)    Anion gap 17 (H) 5 - 15    Comment: Performed at [facility], [place] [address] [place], [place] [address]  TSH     Status: None    Collection Time: 06/08/21  2:54 AM  Result Value Ref Range   TSH 2.640 0.350 - 4.500 uIU/mL    Comment: Performed by a 3rd Generation assay with a functional sensitivity of <=0.01 uIU/mL. Performed at [facility], [place],  [place] [address]   Hepatitis B surface antigen     Status: None   Collection Time: 06/08/21  2:54 AM  Result Value Ref Range   Hepatitis B Surface Ag NON REACTIVE NON REACTIVE    Comment: Performed at [facility] [address] [place], [place] [address]  Blood culture (routine x 2)     Status: None (Preliminary result)   Collection Time: 06/08/21  6:01 AM   Specimen: BLOOD  Result Value Ref Range   Specimen Description BLOOD SITE NOT SPECIFIED    Special Requests      BOTTLES DRAWN AEROBIC ONLY Blood Culture results may not be optimal due to an inadequate volume of blood received in culture bottles   Culture      NO GROWTH 1 DAY Performed at [facility], [place] [address], [place], [place] [address]    Report Status PENDING   CBC with Differential/Platelet     Status: Abnormal   Collection Time: 06/08/21  6:01 AM  Result Value Ref Range   WBC 5.6 4.0 - 10.5 K/uL   RBC 3.26 (L) 4.22 - 5.81 MIL/uL   Hemoglobin 10.1 (L) 13.0 - 17.0 g/dL   HCT 31.4 (L) 39.0 - 52.0 %   MCV 96.3 80.0 - 100.0 fL   MCH 31.0 26.0 - 34.0 pg   MCHC 32.2 30.0 - 36.0 g/dL   RDW 15.5 11.5 - 15.5 %   Platelets 176 150 - 400 K/uL   nRBC 0.0 0.0 - 0.2 %   Neutrophils Relative % 80 %   Neutro Abs 4.4 1.7 - 7.7 K/uL   Lymphocytes Relative 11 %   Lymphs Abs 0.6 (L) 0.7 - 4.0 K/uL   Monocytes Relative 9 %   Monocytes Absolute 0.5 0.1 - 1.0 K/uL   Eosinophils Relative 0 %   Eosinophils Absolute 0.0 0.0 - 0.5 K/uL   Basophils Relative 0 %   Basophils Absolute 0.0 0.0 - 0.1 K/uL   Immature Granulocytes 0 %   Abs Immature Granulocytes 0.02 0.00 - 0.07 K/uL    Comment: Performed at [facility], [place] [address] [place],  [address]  CBG  monitoring, ED     Status: None   Collection Time: 06/08/21  6:11 AM  Result Value Ref Range   Glucose-Capillary 73 70 - 99 mg/dL    Comment: Glucose reference range applies only to samples taken after fasting for at least 8 hours.  CBG monitoring, ED     Status: Abnormal   Collection Time: 06/08/21  8:14 AM  Result Value Ref Range   Glucose-Capillary 102 (H) 70 - 99 mg/dL    Comment: Glucose reference range applies only to samples taken after fasting for at least 8 hours.   Comment 1 Notify RN    Comment 2 Document in Chart   CBG monitoring, ED     Status: Abnormal   Collection Time: 06/08/21 12:10 PM  Result Value Ref Range   Glucose-Capillary 110 (H) 70 - 99 mg/dL    Comment: Glucose reference range applies only to samples taken after fasting for at least 8 hours.  Vitamin B12     Status: Abnormal   Collection Time: 06/09/21  1:43 PM  Result Value Ref Range   Vitamin B-12 954 (H) 180 - 914 pg/mL    Comment: (NOTE) This assay is not validated for testing neonatal or myeloproliferative syndrome specimens for Vitamin B12 levels. Performed at [facility], [address] [place], [place] [address]     MICRO: 1/31 blood cx NGTD IMAGING: CT Head Wo Contrast  Result Date: 06/07/2021 CLINICAL DATA:  [place] today.  Possibly hit his head. EXAM: CT HEAD WITHOUT CONTRAST CT CERVICAL SPINE WITHOUT CONTRAST TECHNIQUE: Multidetector CT imaging of the head and cervical spine was performed following the standard protocol without intravenous contrast. Multiplanar CT image reconstructions of the cervical spine were also generated. RADIATION DOSE REDUCTION: This exam was performed according to the departmental dose-optimization program which includes automated exposure control, adjustment of the mA and/or kV according to patient size and/or use of iterative reconstruction technique. COMPARISON:  Head CT dated 06/03/2021. Head and cervical spine CT dated 05/01/2021. FINDINGS: CT HEAD FINDINGS  Brain: No significant change in moderate to marked diffuse enlargement of the ventricles and subarachnoid spaces. Mild-to-moderate patchy white matter low density in both cerebral hemispheres without significant change. No intracranial hemorrhage, mass lesion or CT evidence of acute infarction. Vascular: No hyperdense vessel or unexpected calcification. Atheromatous arterial calcifications at the skull base. Skull: Normal. Negative for fracture or focal lesion. Sinuses/Orbits: Status post right cataract extraction. Bilateral anterior ethmoid sinus mucosal thickening. Mild left maxillary sinus mucosal thickening. Other: None. CT CERVICAL SPINE FINDINGS Alignment: Stable reversal of the normal cervical lordosis. No subluxations. Skull base and vertebrae: No acute fracture. No primary bone lesion or focal pathologic process. Soft tissues and spinal canal: No prevertebral fluid or swelling. No visible canal hematoma. Disc levels:  Stable multilevel degenerative changes. Upper chest: Clear lung apices. Other: Bilateral carotid artery calcifications. IMPRESSION: 1. No skull fracture or intracranial hemorrhage. 2. No cervical spine fracture or subluxation. 3. Stable diffuse cerebral and cerebellar atrophy in chronic small vessel white matter ischemic changes. 4. Stable reversal of the normal cervical lordosis and multilevel cervical spine degenerative changes. 5. Bilateral carotid artery atheromatous calcifications. Electronically Signed   By: Claudie Revering M.D.   On: 06/07/2021 16:38   CT Cervical Spine Wo Contrast  Result Date: 06/07/2021 CLINICAL DATA:  [place] today.  Possibly hit his head. EXAM: CT HEAD WITHOUT CONTRAST CT CERVICAL SPINE WITHOUT CONTRAST TECHNIQUE: Multidetector CT imaging of the head and cervical spine was performed following the standard protocol without intravenous contrast. Multiplanar CT image reconstructions of the cervical spine were also generated. RADIATION DOSE REDUCTION: This exam was  performed according to the departmental dose-optimization program which includes automated exposure control, adjustment of the mA and/or kV according to patient size and/or use of iterative reconstruction technique. COMPARISON:  Head CT dated 06/03/2021. Head and cervical spine CT dated 05/01/2021. FINDINGS: CT HEAD FINDINGS Brain: No significant change in moderate to marked diffuse enlargement of the ventricles and subarachnoid spaces. Mild-to-moderate patchy white matter low density in both cerebral hemispheres without significant change. No intracranial hemorrhage, mass lesion or CT evidence of acute infarction. Vascular: No hyperdense vessel or unexpected calcification. Atheromatous arterial calcifications at the skull base. Skull: Normal. Negative for fracture or focal lesion. Sinuses/Orbits: Status post right cataract extraction. Bilateral anterior ethmoid sinus mucosal thickening. Mild left maxillary sinus mucosal thickening. Other: None. CT CERVICAL SPINE FINDINGS Alignment: Stable reversal of the normal cervical lordosis. No subluxations. Skull base and vertebrae: No acute fracture. No primary bone lesion or focal pathologic process. Soft tissues and spinal canal: No prevertebral fluid or swelling. No visible canal hematoma. Disc levels:  Stable multilevel degenerative changes. Upper chest: Clear lung apices. Other: Bilateral carotid artery calcifications. IMPRESSION: 1. No skull fracture or intracranial hemorrhage. 2. No cervical spine fracture  or subluxation. 3. Stable diffuse cerebral and cerebellar atrophy in chronic small vessel white matter ischemic changes. 4. Stable reversal of the normal cervical lordosis and multilevel cervical spine degenerative changes. 5. Bilateral carotid artery atheromatous calcifications. Electronically Signed   By: Claudie Revering M.D.   On: 06/07/2021 16:38   MR FOOT LEFT WO CONTRAST  Result Date: 06/08/2021 CLINICAL DATA:  Osteomyelitis, foot EXAM: MRI OF THE LEFT FOOT  WITHOUT CONTRAST TECHNIQUE: Multiplanar, multisequence MR imaging of the left forefoot was performed. No intravenous contrast was administered. COMPARISON:  Left foot radiograph 06/07/2021 FINDINGS: Bones/Joint/Cartilage Prior great toe amputation. There is bony edema and confluent low T1 signal within the second toe proximal phalanx and mid to distal second metatarsal. The second toe proximal phalanx is dorsally dislocated from the second metatarsal head. There is edema signal within the first and third metatarsal heads with preserved T1 signal. Similar findings but to a lesser degree in the fourth metatarsal head. There is a subacute fracture of the third metatarsal head/neck with mild angulation. Ligaments Surgically disrupted first MTP ligaments. Torn second MTP plantar plate and collateral ligaments. The third through fifth MTP collateral ligaments appear intact without evidence of plantar plate tear. The Lisfranc ligament appears intact. Muscles and Tendons Diffuse intramuscular edema and muscle atrophy in the foot as is commonly seen in diabetics. Soft tissues Diffuse soft tissue swelling of the foot. There is some localized fluid in the first webspace between the first and second metatarsal heads, not well-defined. 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 Dorsal dislocation of the second MTP joint. Bony edema in the adjacent first and third metatarsal heads which is favored to be reactive, though early osteomyelitis could have a similar appearance. Subacute third metatarsal head/neck fracture with mild angulation, accounting for some of the aforementioned bony edema. Localized fluid in the first webspace between the first and second metatarsal heads, not well-defined but could represent developing phlegmon/abscess. Reactive mild edema in the fourth metatarsal head. Electronically Signed   By: Maurine Simmering M.D.   On: 06/08/2021 09:28   DG Chest  Portable 1 View  Result Date: 06/07/2021 CLINICAL DATA:  Concern for pneumonia EXAM: PORTABLE CHEST 1 VIEW COMPARISON:  Chest x-ray 06/03/2021 FINDINGS: Cardiomegaly and mediastinum are stable. Pulmonary vasculature appears within normal limits. No focal consolidation identified. Prominent chronic interstitial lung markings similar to previous. No significant pleural effusion visualized. No pneumothorax. IMPRESSION: Cardiomegaly with no acute process identified. Electronically Signed   By: Ofilia Neas M.D.   On: 06/07/2021 16:12   DG Foot Complete Left  Result Date: 06/07/2021 CLINICAL DATA:  Left great toe amputation, EXAM: LEFT FOOT - COMPLETE 3+ VIEW COMPARISON:  05/13/2021 FINDINGS: Frontal, oblique, lateral views of the left foot are obtained. Prior amputation at the level of the first metatarsophalangeal joint. There is diffuse soft tissue swelling throughout the forefoot and remaining digits, as well as at the surgical site. On the lateral view, there is evidence of bony destruction at the base of the second proximal phalanx concerning for osteomyelitis. No other acute or destructive bony lesions. Stable osteoarthritis. Stable postsurgical changes of the left ankle. IMPRESSION: 1. Interval amputation at the first metatarsophalangeal joint. 2. Bony destruction at the base of the second proximal phalanx, best seen on lateral view, concerning for osteomyelitis. 3. Progressive soft tissue swelling throughout the left forefoot and remaining digits. Electronically Signed   By: Randa Ngo M.D.   On: 06/07/2021 19:20  Assessment/Plan:  80yoM with osteomyelitis of left foot. Localized to 2nd digit. Has already had great toe amputated on 1/11- healing incision  - continue with vancomycin and cefepime with HD x 6 wk - continue with metronidazole 500mg  PO BID x 6 wk - please check sed rate and crp  Covid-illness = he finishes antiviral today. Continue on isolation for addn 4 days to finish 10  d of isolation  Will seed back in ID clinic in 4-6 wk  Vearl Aitken B. [facility] [phone]

## 2021-06-09 NOTE — TOC Initial Note (Signed)
Transition of Care [facility]) - Initial/Assessment Note    Patient Details  Name: Darryl Diaz MRN: [id number] Date of Birth: [name] 23, 1941  Transition of Care [facility]) CM/SW Contact:    Sharin Mons, RN Phone Number: 06/09/2021, 4:47 PM  Clinical Narrative:       Readmitted with Osteomyelitis of left foot .      NCM spoke with pt regarding d/c planning. Pt states resides alone @ [place]. Supportive family.PTA active with [facility]. Pt agreeable to resumption of services @ d/c. Pt without DME needs. TOC team following and will assist with d/c needs.       Patient Goals and CMS Choice        Expected Discharge Plan and Services     Discharge Planning Services: CM Consult                               HH Arranged: PT, OT, Nurse's Aide, Social Work, Therapist, sports [place] Agency: Other - See comment ([place])        Prior Living Arrangements/Services     Patient language and need for interpreter reviewed:: Yes        Need for Family Participation in Patient Care: Yes (Comment) Care giver support system in place?: Yes (comment) Current home services: DME (3 canes, W/C,2 rolators, RW) Criminal Activity/Legal Involvement Pertinent to Current Situation/Hospitalization: No - Comment as needed  Activities of Daily Living Home Assistive Devices/Equipment: Walker (specify type), Blood pressure cuff ADL Screening (condition at time of admission) Patient's cognitive ability adequate to safely complete daily activities?: Yes Is the patient deaf or have difficulty hearing?: No Does the patient have difficulty seeing, even when wearing glasses/contacts?: No Does the patient have difficulty concentrating, remembering, or making decisions?: No Patient able to express need for assistance with ADLs?: Yes Does the patient have difficulty dressing or bathing?: No Independently performs ADLs?: Yes (appropriate for developmental age) Does the patient have difficulty walking  or climbing stairs?: Yes Weakness of Legs: Left Weakness of Arms/Hands: None  Permission Sought/Granted                  Emotional Assessment Appearance:: Appears stated age Attitude/Demeanor/Rapport: Gracious Affect (typically observed): Accepting Orientation: : Oriented to Self, Oriented to Place, Oriented to  Time, Oriented to Situation Alcohol / Substance Use: Not Applicable Psych Involvement: No (comment)  Admission diagnosis:  Fall, initial encounter [W19.XXXA] Osteomyelitis of great toe of left foot (HCC) [M86.9] Patient Active Problem List   Diagnosis Date Noted   Frequent falls 06/07/2021   Generalized weakness 06/07/2021   Incidental finding of COVID-19 virus infection 06/07/2021   Dependence on renal dialysis ([place]) 05/25/2021   Non-pressure chronic ulcer of other part of left foot with unspecified severity ([place]) 05/25/2021   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[place]) 05/25/2021   Osteomyelitis of great toe of left foot ([place])    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   Cellulitis of right foot 01/17/2021   Normocytic anemia 01/17/2021   Acute lower UTI 01/17/2021   Acute GI bleeding 01/06/2021   Symptomatic anemia 12/02/2020   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   Orthostatic hypotension 11/23/2020   Acute renal insufficiency 10/23/2020   Alcohol abuse 10/23/2020   Hypokalemia 10/23/2020   Hypomagnesemia 10/23/2020   Low back pain at multiple sites 10/23/2020   Malnutrition of mild degree Altamease [place]: 75% to less than 90% of standard weight) ([place])  10/23/2020   Open wound of scalp without complication 14/78/2956   Oral candidiasis 10/23/2020   Cellulitis of left foot    Cellulitis of toe of left foot 10/13/2020   Skin ulcer of left great toe ([place]) 10/13/2020   CKD (chronic kidney disease) stage 4, 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/13/2020   Chronic heart failure with preserved ejection fraction (HFpEF) ([place]) 10/13/2020   Transient weakness  of left lower extremity 09/07/2020   Heme positive stool 05/14/2020   Abnormality of gait 04/27/2020   Salmonella food poisoning 21/30/8657   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   Yeast infection 03/16/2020   Diarrhea 03/16/2020   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 (HCC)    Allergies    Anemia, chronic disease    History of hypertension    Debility    Syncope and collapse 02/22/2020   AKI (acute kidney injury) ([place]) 02/10/2020   Unspecified protein-calorie malnutrition ([place]) 02/10/2020   Weakness 02/06/2020   History of repair of hip joint 10/15/2019   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   Pain in right foot 06/27/2019   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   Bradycardia 04/20/2019   Gastrointestinal hemorrhage 02/25/2019   Fall at home, initial encounter 02/25/2019   Bilateral lower extremity edema 02/25/2019   Hyponatremia 02/25/2019   Fall 02/25/2019   Benign prostatic hyperplasia with lower urinary tract symptoms 12/19/2018   Iron deficiency anemia due to chronic blood loss 08/11/2018   Anemia 08/11/2018   Hardening of the aorta (main artery of the heart) ([place]) 08/11/2018   Benign neoplasm of colon 06/18/2018   History of adenomatous polyp of colon 08/10/2017   AF (paroxysmal atrial fibrillation) ([place]) 02/05/2017   Impaired fasting glucose 06/10/2016   Encounter for general adult medical examination without abnormal findings 06/03/2016   Posterior dislocation of hip, closed ([place]) 04/30/2016   Hip fracture ([place]) 03/30/2016   Hypertension 03/30/2016   Hyperlipidemia 03/30/2016   Osteoarthritis 03/30/2016   Depressive disorder 03/30/2016   Rhabdomyolysis 03/30/2016   Leukocytosis 03/30/2016   Pressure ulcer 03/30/2016   Fracture of hip ([place]) 03/30/2016   Gastroesophageal reflux disease 12/17/2015   HTN (hypertension) 12/17/2015   Long term (current) use of anticoagulants 12/17/2015   Moderate major depression, single episode  ([place]) 12/17/2015   Pure hypercholesterolemia 12/17/2015   Seasonal allergic rhinitis 12/17/2015   CAD (coronary artery disease) 08/21/2015   PCP:  [name], MD Pharmacy:   [facility], [address] [place] [address] Phone: [phone] Fax: [phone]     Social Determinants of Health (SDOH) Interventions    Readmission Risk Interventions Readmission Risk Prevention Plan 01/18/2021 03/18/2020 03/05/2020  Transportation Screening Complete Complete Complete  PCP or Specialist Appt within 5-7 Days - - -  Home Care Screening - - -  Medication Review (RN CM) - - -  [place] or [place] - Complete Complete  Social Work Consult for [place] Planning/Counseling - Complete Complete  Palliative Care Screening - Not Applicable Not Applicable  Medication Review Press photographer) Complete Complete Complete  PCP or Specialist appointment within 3-5 days of discharge Complete - -  HRI or Home Care Consult Complete - -  SW Recovery Care/Counseling Consult Complete - -  Palliative Care Screening Not Applicable - -  Skilled Nursing Facility Complete - -  Some recent data might be hidden

## 2021-06-09 NOTE — Plan of Care (Signed)

## 2021-06-09 NOTE — Consult Note (Signed)
ORTHOPAEDIC CONSULTATION  REQUESTING PHYSICIAN: Kayleen Memos, DO  Chief Complaint: Cellulitis and swelling left foot.  HPI: Darryl Diaz is a 81 y.o. male who presents with cellulitis swelling left foot.  Patient is status post a left great toe amputation.  Past Medical History:  Diagnosis Date   Alcohol abuse 94/17/4081   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[place]) 03/17/2020   Alcoholism ([place])    in remission following wife's death   Anemia    Ascending aorta dilation (HCC)    Atrial fibrillation (HCC)    Cardiac arrest ([place]) 04/20/2019   12 min CPR with epi   Chronic diastolic CHF (congestive heart failure) (HCC)    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[place])    Mon Wed Fri   Fatty liver    Gastritis    GERD (gastroesophageal reflux disease)    H/O seasonal allergies    History of blood transfusion    History of GI bleed    Hx of adenomatous colonic polyps 08/10/2017   Hyperlipidemia    Hypertension    Insomnia    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   Mallory-Weiss tear    OA (osteoarthritis)    hands   OSA (obstructive sleep apnea)    No longer uses CPAP   Persistent atrial fibrillation with rapid ventricular response ([place]) 02/05/2020   Recurrent syncope    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[facility])    Past Surgical History:  Procedure Laterality Date   AMPUTATION Left 05/19/2021   Procedure: LEFT GREAT TOE AMPUTATION;  Surgeon: Newt Minion, MD;  Location: [place];  Service: Orthopedics;  Laterality: Left;   ANKLE SURGERY Left 2014   had rods put in    AV FISTULA PLACEMENT Left 04/21/2021   Procedure: LEFT ARM ARTERIOVENOUS (AV) FISTULA CREATION - Left brachiocephalic fistula;  Surgeon: Marty Heck, MD;  Location: [place];  Service: Vascular;  Laterality: Left;   BIOPSY  02/28/2019   Procedure: BIOPSY;  Surgeon: Milus Banister, MD;  Location: [place];  Service: Endoscopy;;   CARDIOVERSION  02/06/2020   CARDIOVERSION N/A 02/06/2020    Procedure: CARDIOVERSION;  Surgeon: Werner Lean, MD;  Location: [place] ENDOSCOPY;  Service: Cardiovascular;  Laterality: N/A;   CARDIOVERSION N/A 02/24/2020   Procedure: CARDIOVERSION;  Surgeon: Werner Lean, MD;  Location: [place];  Service: Cardiovascular;  Laterality: N/A;   COLONOSCOPY     [place]   COLONOSCOPY WITH PROPOFOL N/A 01/08/2021   Procedure: COLONOSCOPY WITH PROPOFOL;  Surgeon: Milus Banister, MD;  Location: [facility] ENDOSCOPY;  Service: Endoscopy;  Laterality: N/A;   ESOPHAGOGASTRODUODENOSCOPY (EGD) WITH PROPOFOL N/A 02/28/2019   Procedure: ESOPHAGOGASTRODUODENOSCOPY (EGD) WITH PROPOFOL;  Surgeon: Milus Banister, MD;  Location: [facility] ENDOSCOPY;  Service: Endoscopy;  Laterality: N/A;   HIP ARTHROPLASTY Left 04/01/2016   Procedure: ARTHROPLASTY BIPOLAR HIP (HEMIARTHROPLASTY);  Surgeon: Paralee Cancel, MD;  Location: [place];  Service: Orthopedics;  Laterality: Left;   HIP CLOSED REDUCTION Left 04/30/2016   Procedure: CLOSED REDUCTION HIP;  Surgeon: Wylene Simmer, MD;  Location: [place];  Service: Orthopedics;  Laterality: Left;   HOT HEMOSTASIS N/A 01/08/2021   Procedure: HOT HEMOSTASIS (ARGON PLASMA COAGULATION/BICAP);  Surgeon: Milus Banister, MD;  Location: [facility] ENDOSCOPY;  Service: Endoscopy;  Laterality: N/A;   IR RADIOLOGIST EVAL & MGMT  12/31/2020   TONSILLECTOMY     TOTAL HIP ARTHROPLASTY Right 10/15/2019   Procedure: TOTAL HIP ARTHROPLASTY ANTERIOR APPROACH;  Surgeon: Paralee Cancel,  MD;  Location: [facility];  Service: Orthopedics;  Laterality: Right;  70 mins   Social History   Socioeconomic History   Marital status: Widowed    Spouse name: Not on file   Number of children: 2   Years of education: college   Highest education level: Not on file  Occupational History   Occupation: retired  Tobacco Use   Smoking status: Former    Packs/day: 1.00    Years: 20.00    Pack years: 20.00    Types: Cigarettes   Smokeless tobacco: Never   Tobacco comments:        quit 20 years  smoked on and off for 20 years  Vaping Use   Vaping Use: Never used  Substance and Sexual Activity   Alcohol use: Not Currently    Alcohol/week: 3.0 standard drinks    Types: 3 Glasses of wine per week    Comment: h/o heavy use   Drug use: Never   Sexual activity: Not Currently  Other Topics Concern   Not on file  Social History Narrative   ** Merged History Encounter **       The patient is divorced w/ 1 son 1 daughter He is a retired Regulatory affairs officer, he lived in [place] but [employer] including windows on the world in the [place] room in [place] He moved to [place] to be near his sister who liv   es in [place] alcoholic member of AA 2 caffeinated beverages daily prior smoker no drugs   Social Determinants of Radio broadcast assistant Strain: Low Risk    Difficulty of Paying Living Expenses: Not hard at [employer] Insecurity: No Food Insecurity   Worried About Charity fundraiser in the Last Year: Never true   Arboriculturist in the Last Year: Never true  Transportation Needs: No Transportation Needs   Lack of Transportation (Medical): No   Lack of Transportation (Non-Medical): No  Physical Activity: Not on file  Stress: No Stress Concern Present   Feeling of Stress : Not at all  Social Connections: Moderately Integrated   Frequency of Communication with Friends and Family: More than three times a week   Frequency of Social Gatherings with Friends and Family: More than three times a week   Attends Religious Services: 1 to 4 times per year   Active Member of [employer] or Organizations: No   Attends Archivist Meetings: 1 to 4 times per year   Marital Status: Widowed   Family History  Problem Relation Age of Onset   Heart disease Mother 6   Hypertension Mother    Arthritis Mother    Heart failure Father 46   Stroke Maternal Aunt    Heart failure Sister    Colon cancer Neg Hx    Esophageal cancer Neg  Hx    Stomach cancer Neg Hx    Rectal cancer Neg Hx    - negative except otherwise stated in the family history section Allergies  Allergen Reactions   Penicillins Rash   Prior to Admission medications   Medication Sig Start Date End Date Taking? Authorizing Provider  acetaminophen (TYLENOL) 325 MG tablet Take 2 tablets (650 mg total) by mouth every 4 (four) hours as needed for headache or mild pain. Patient taking differently: Take 325-650 mg by mouth every 4 (four) hours as needed for headache. 03/10/20  Yes Angiulli, Lavon Paganini, PA-C  amiodarone (PACERONE) 200 MG tablet  TAKE 1 TABLET BY MOUTH ONCE DAILY Patient taking differently: Take 200 mg by mouth every morning. 05/17/21  Yes Weaver, Scott T, PA-C  atorvastatin (LIPITOR) 20 MG tablet Take 1 tablet (20 mg total) by mouth daily. Patient taking differently: Take 20 mg by mouth every morning. 03/10/20  Yes Angiulli, Lavon Paganini, PA-C  carvedilol (COREG) 6.25 MG tablet Take 1 tablet (6.25 mg total) by mouth 2 (two) times daily. 02/11/21  Yes Sherren Mocha, MD  doxycycline (VIBRA-TABS) 100 MG tablet Take 100 mg by mouth 2 (two) times daily. 06/01/21  Yes [provider]  escitalopram (LEXAPRO) 10 MG tablet Take 1 tablet (10 mg total) by mouth daily. Patient taking differently: Take 10 mg by mouth every morning. 03/10/20  Yes Angiulli, Lavon Paganini, PA-C  ferrous sulfate 324 MG TBEC Take 324 mg by mouth every morning.   Yes [provider]  FOLIC ACID PO Take 1 tablet by mouth every morning.   Yes [provider]  HYDROcodone-acetaminophen (NORCO/VICODIN) 5-325 MG tablet Take 1 tablet by mouth every 4 (four) hours as needed. Patient taking differently: Take 1 tablet by mouth every 4 (four) hours as needed (pain). 05/19/21  Yes Newt Minion, MD  Multiple Vitamins-Minerals (CENTRUM SILVER 50+MEN) TABS Take 1 tablet by mouth every other day.   Yes [provider]  nitroGLYCERIN (NITROSTAT) 0.4 MG SL tablet Place 1 tablet  (0.4 mg total) under the tongue every 5 (five) minutes x 3 doses as needed for chest pain. 07/28/20  Yes Baldwin Jamaica, PA-C  pantoprazole (PROTONIX) 40 MG tablet Take 1 tablet (40 mg total) by mouth daily. Patient taking differently: Take 40 mg by mouth every morning. 03/10/20  Yes Angiulli, Lavon Paganini, PA-C  tamsulosin (FLOMAX) 0.4 MG CAPS capsule Take 0.4 mg by mouth at bedtime as needed (frequent urination). 06/01/21  Yes [provider]  torsemide (DEMADEX) 20 MG tablet Take 60 mg by mouth See admin instructions. Take 3 tablets (60 mg) by mouth every morning, may also take 3 tablets (60 mg) in the afternoon as needed for swelling   Yes [provider]  traZODone (DESYREL) 50 MG tablet Take 1 tablet (50 mg total) by mouth at bedtime. Patient taking differently: Take 50 mg by mouth at bedtime as needed for sleep. 03/10/20  Yes Angiulli, Lavon Paganini, PA-C  cetirizine (ZYRTEC) 10 MG tablet Take 10 mg by mouth daily. Patient not taking: Reported on 06/08/2021    [provider]  ketoconazole (NIZORAL) 2 % cream Apply 1 application topically daily. To dry scaly skin on feet Patient not taking: Reported on 06/08/2021 07/23/20   Criselda Peaches, DPM  Magnesium Oxide 400 MG CAPS Take 1 capsule (400 mg total) by mouth daily. Patient not taking: Reported on 06/08/2021 10/17/20   Eugenie Filler, MD  molnupiravir EUA (LAGEVRIO) 200 mg CAPS capsule Take 4 capsules (800 mg total) by mouth 2 (two) times daily for 5 days. Patient not taking: Reported on 06/08/2021 06/04/21 06/09/21  Wyvonnia Dusky, MD  nystatin (MYCOSTATIN/NYSTOP) powder Apply topically 2 (two) times daily. Patient not taking: Reported on 06/08/2021 01/22/21   Antonieta Pert, MD  potassium chloride SA (KLOR-CON) 20 MEQ tablet Take 1 tablet (20 mEq total) by mouth daily. Patient not taking: Reported on 06/08/2021 10/17/20 10/12/21  Eugenie Filler, MD  sodium bicarbonate 650 MG tablet Take 1 tablet (650 mg total) by mouth 2 (two)  times daily. Patient not taking: Reported on 06/08/2021 01/22/21   Kc,  Maren Beach, MD  torsemide 40 MG TABS Take 40 mg by mouth daily. Patient not taking: Reported on 06/08/2021 01/22/21   Antonieta Pert, MD   CT Head Wo Contrast  Result Date: 06/07/2021 CLINICAL DATA:  [place] today.  Possibly hit his head. EXAM: CT HEAD WITHOUT CONTRAST CT CERVICAL SPINE WITHOUT CONTRAST TECHNIQUE: Multidetector CT imaging of the head and cervical spine was performed following the standard protocol without intravenous contrast. Multiplanar CT image reconstructions of the cervical spine were also generated. RADIATION DOSE REDUCTION: This exam was performed according to the departmental dose-optimization program which includes automated exposure control, adjustment of the mA and/or kV according to patient size and/or use of iterative reconstruction technique. COMPARISON:  Head CT dated 06/03/2021. Head and cervical spine CT dated 05/01/2021. FINDINGS: CT HEAD FINDINGS Brain: No significant change in moderate to marked diffuse enlargement of the ventricles and subarachnoid spaces. Mild-to-moderate patchy white matter low density in both cerebral hemispheres without significant change. No intracranial hemorrhage, mass lesion or CT evidence of acute infarction. Vascular: No hyperdense vessel or unexpected calcification. Atheromatous arterial calcifications at the skull base. Skull: Normal. Negative for fracture or focal lesion. Sinuses/Orbits: Status post right cataract extraction. Bilateral anterior ethmoid sinus mucosal thickening. Mild left maxillary sinus mucosal thickening. Other: None. CT CERVICAL SPINE FINDINGS Alignment: Stable reversal of the normal cervical lordosis. No subluxations. Skull base and vertebrae: No acute fracture. No primary bone lesion or focal pathologic process. Soft tissues and spinal canal: No prevertebral fluid or swelling. No visible canal hematoma. Disc levels:  Stable multilevel degenerative changes. Upper  chest: Clear lung apices. Other: Bilateral carotid artery calcifications. IMPRESSION: 1. No skull fracture or intracranial hemorrhage. 2. No cervical spine fracture or subluxation. 3. Stable diffuse cerebral and cerebellar atrophy in chronic small vessel white matter ischemic changes. 4. Stable reversal of the normal cervical lordosis and multilevel cervical spine degenerative changes. 5. Bilateral carotid artery atheromatous calcifications. Electronically Signed   By: Claudie Revering M.D.   On: 06/07/2021 16:38   CT Cervical Spine Wo Contrast  Result Date: 06/07/2021 CLINICAL DATA:  [place] today.  Possibly hit his head. EXAM: CT HEAD WITHOUT CONTRAST CT CERVICAL SPINE WITHOUT CONTRAST TECHNIQUE: Multidetector CT imaging of the head and cervical spine was performed following the standard protocol without intravenous contrast. Multiplanar CT image reconstructions of the cervical spine were also generated. RADIATION DOSE REDUCTION: This exam was performed according to the departmental dose-optimization program which includes automated exposure control, adjustment of the mA and/or kV according to patient size and/or use of iterative reconstruction technique. COMPARISON:  Head CT dated 06/03/2021. Head and cervical spine CT dated 05/01/2021. FINDINGS: CT HEAD FINDINGS Brain: No significant change in moderate to marked diffuse enlargement of the ventricles and subarachnoid spaces. Mild-to-moderate patchy white matter low density in both cerebral hemispheres without significant change. No intracranial hemorrhage, mass lesion or CT evidence of acute infarction. Vascular: No hyperdense vessel or unexpected calcification. Atheromatous arterial calcifications at the skull base. Skull: Normal. Negative for fracture or focal lesion. Sinuses/Orbits: Status post right cataract extraction. Bilateral anterior ethmoid sinus mucosal thickening. Mild left maxillary sinus mucosal thickening. Other: None. CT CERVICAL SPINE FINDINGS  Alignment: Stable reversal of the normal cervical lordosis. No subluxations. Skull base and vertebrae: No acute fracture. No primary bone lesion or focal pathologic process. Soft tissues and spinal canal: No prevertebral fluid or swelling. No visible canal hematoma. Disc levels:  Stable multilevel degenerative changes. Upper chest: Clear lung apices. Other: Bilateral carotid artery calcifications. IMPRESSION: 1.  No skull fracture or intracranial hemorrhage. 2. No cervical spine fracture or subluxation. 3. Stable diffuse cerebral and cerebellar atrophy in chronic small vessel white matter ischemic changes. 4. Stable reversal of the normal cervical lordosis and multilevel cervical spine degenerative changes. 5. Bilateral carotid artery atheromatous calcifications. Electronically Signed   By: Claudie Revering M.D.   On: 06/07/2021 16:38   MR FOOT LEFT WO CONTRAST  Result Date: 06/08/2021 CLINICAL DATA:  Osteomyelitis, foot EXAM: MRI OF THE LEFT FOOT WITHOUT CONTRAST TECHNIQUE: Multiplanar, multisequence MR imaging of the left forefoot was performed. No intravenous contrast was administered. COMPARISON:  Left foot radiograph 06/07/2021 FINDINGS: Bones/Joint/Cartilage Prior great toe amputation. There is bony edema and confluent low T1 signal within the second toe proximal phalanx and mid to distal second metatarsal. The second toe proximal phalanx is dorsally dislocated from the second metatarsal head. There is edema signal within the first and third metatarsal heads with preserved T1 signal. Similar findings but to a lesser degree in the fourth metatarsal head. There is a subacute fracture of the third metatarsal head/neck with mild angulation. Ligaments Surgically disrupted first MTP ligaments. Torn second MTP plantar plate and collateral ligaments. The third through fifth MTP collateral ligaments appear intact without evidence of plantar plate tear. The Lisfranc ligament appears intact. Muscles and Tendons Diffuse  intramuscular edema and muscle atrophy in the foot as is commonly seen in diabetics. Soft tissues Diffuse soft tissue swelling of the foot. There is some localized fluid in the first webspace between the first and second metatarsal heads, not well-defined. 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 Dorsal dislocation of the second MTP joint. Bony edema in the adjacent first and third metatarsal heads which is favored to be reactive, though early osteomyelitis could have a similar appearance. Subacute third metatarsal head/neck fracture with mild angulation, accounting for some of the aforementioned bony edema. Localized fluid in the first webspace between the first and second metatarsal heads, not well-defined but could represent developing phlegmon/abscess. Reactive mild edema in the fourth metatarsal head. Electronically Signed   By: Maurine Simmering M.D.   On: 06/08/2021 09:28   DG Chest Portable 1 View  Result Date: 06/07/2021 CLINICAL DATA:  Concern for pneumonia EXAM: PORTABLE CHEST 1 VIEW COMPARISON:  Chest x-ray 06/03/2021 FINDINGS: Cardiomegaly and mediastinum are stable. Pulmonary vasculature appears within normal limits. No focal consolidation identified. Prominent chronic interstitial lung markings similar to previous. No significant pleural effusion visualized. No pneumothorax. IMPRESSION: Cardiomegaly with no acute process identified. Electronically Signed   By: Ofilia Neas M.D.   On: 06/07/2021 16:12   DG Foot Complete Left  Result Date: 06/07/2021 CLINICAL DATA:  Left great toe amputation, EXAM: LEFT FOOT - COMPLETE 3+ VIEW COMPARISON:  05/13/2021 FINDINGS: Frontal, oblique, lateral views of the left foot are obtained. Prior amputation at the level of the first metatarsophalangeal joint. There is diffuse soft tissue swelling throughout the forefoot and remaining digits, as well as at the surgical site. On the lateral view, there is  evidence of bony destruction at the base of the second proximal phalanx concerning for osteomyelitis. No other acute or destructive bony lesions. Stable osteoarthritis. Stable postsurgical changes of the left ankle. IMPRESSION: 1. Interval amputation at the first metatarsophalangeal joint. 2. Bony destruction at the base of the second proximal phalanx, best seen on lateral view, concerning for osteomyelitis. 3. Progressive soft tissue swelling throughout the left forefoot and remaining digits. Electronically Signed   By:  Randa Ngo M.D.   On: 06/07/2021 19:20   - pertinent xrays, CT, MRI studies were reviewed and independently interpreted  Positive ROS: All other systems have been reviewed and were otherwise negative with the exception of those mentioned in the HPI and as above.  Physical Exam: General: Alert, no acute distress Psychiatric: Patient is competent for consent with normal mood and affect Lymphatic: No axillary or cervical lymphadenopathy Cardiovascular: No pedal edema Respiratory: No cyanosis, no use of accessory musculature GI: No organomegaly, abdomen is soft and non-tender    Images:  @ENCIMAGES @  Labs:  Lab Results  Component Value Date   HGBA1C 4.4 (L) 09/07/2020   HGBA1C 4.7 (L) 02/28/2019   ESRSEDRATE 25 (H) 06/07/2021   CRP 4.1 (H) 06/07/2021   REPTSTATUS PENDING 06/08/2021   GRAMSTAIN  01/20/2021    RARE WBC PRESENT,BOTH PMN AND MONONUCLEAR RARE GRAM POSITIVE COCCI IN PAIRS Performed at [facility], [place] [address] [place], [place] [address]    CULT  06/08/2021    NO GROWTH < 12 HOURS Performed at [facility] [address] [place], [place] [address]    LABORGA STAPHYLOCOCCUS AUREUS 01/20/2021    Lab Results  Component Value Date   ALBUMIN 2.6 (L) 06/08/2021   ALBUMIN 2.5 (L) 06/07/2021   ALBUMIN 3.0 (L) 05/02/2021     CBC EXTENDED Latest Ref Rng & Units 06/08/2021 06/07/2021 06/05/2021  WBC 4.0 - 10.5 K/uL 5.6 5.0 4.2  RBC 4.22  - 5.81 MIL/uL 3.26(L) 2.94(L) 2.97(L)  HGB 13.0 - 17.0 g/dL 10.1(L) 9.3(L) 9.4(L)  HCT 39.0 - 52.0 % 31.4(L) 28.0(L) 28.8(L)  PLT 150 - 400 K/uL 176 182 169  NEUTROABS 1.7 - 7.7 K/uL 4.4 3.3 2.7  LYMPHSABS 0.7 - 4.0 K/uL 0.6(L) 1.2 0.8    Neurologic: Patient does not have protective sensation bilateral lower extremities.   MUSCULOSKELETAL:   Skin: Examination patient has cellulitis of the foot.  The great toe amputation incision is well approximated except in the first webspace which there is some wound the dehiscence.  There is no sausage digit swelling of the toes no clinical signs of chronic osteomyelitis of the toes.  Patient has a strong dorsalis pedis pulse.  White cell count 5.6 hemoglobin 10.1.  Albumin 2.6 with a normal hemoglobin A1c  Review of the MRI scan does show some edema in the second metatarsal head as well as the lesser toes.  The second metatarsal head edema may be secondary to early weightbearing.  Clinically the toes are not infected.  Assessment: Cellulitis and slight wound dehiscence left foot status post great toe amputation.  Plan: Would continue with IV antibiotics.  Anticipate the cellulitis should resolve with the antibiotics.  With patient's good circulation to the ankle he should be able to resolve the cellulitis with IV antibiotics.  I will follow during his hospital stay.  Strict nonweightbearing on the left with dry dressing change as needed  Thank you for the consult and the opportunity to see Mr. Cleophas Yoak, [place] [phone] 8:09 AM

## 2021-06-09 NOTE — Progress Notes (Signed)
Pharmacy Antibiotic Note  Darryl Diaz is a 81 y.o. male admitted on 06/07/2021 with osteomyelitis. Pharmacy was consulted for vancomycin dosing yesterday. Pharmacy is now consulted to dose cefepime for osteomyelitis.  Pt has ESRD and on iHD MWF (last HD was 1/27, missed yesterday; per HD RN, pt in HD now for shortened 3-hr session, due to staffing); per provider note, pt still makes urine  Pt rec'd vancomycin 1750 mg IV X at ~2330  Plan to do as shorter HD today to get back on schedule.   Plan:  Vanc 750mg  MWF Cefepime 2g IV MWF Levels if needed  Height: 6' (182.9 cm) Weight: 89.5 kg (197 lb 5 oz) IBW/kg (Calculated) : 77.6  Temp (24hrs), Avg:98.1 F (36.7 C), Min:97.2 F (36.2 C), Max:98.7 F (37.1 C)  Recent Labs  Lab 06/03/21 1602 06/05/21 0741 06/07/21 1530 06/08/21 0253 06/08/21 0601  WBC 7.6 4.2 5.0  --  5.6  CREATININE 2.90* 2.74* 4.25* 4.41*  --   LATICACIDVEN 2.5*  --   --   --   --      Estimated Creatinine Clearance: 14.9 mL/min (A) (by C-G formula based on SCr of 4.41 mg/dL (H)).    Allergies  Allergen Reactions   Penicillins Rash   Antimicrobials This Admission: Vancomycin 1/30 >> Cefepime 1/31 >> Ceftriaxone 1/30  Metronidazole IV 1/31 >> Molnupiravir 1/30 >> (2/2)  Microbiology Data: 1/31 HBV surface Ag: negative 1/31 Bld cx X 2: NGTD

## 2021-06-09 NOTE — Progress Notes (Signed)
[facility] Progress Note   Subjective:   Reports he is feeling better, says his foot does not hurt today. Ongoing cough but improving per pt. Denies SOB, CP, palpitations, dizziness, abdominal pain and nausea.   Objective Vitals:   06/08/21 1941 06/09/21 0431 06/09/21 0659 06/09/21 0836  BP: 129/90 (!) 163/48  (!) 176/84  Pulse: 81 62  60  Resp: 16 20  19   Temp: (!) 97.2 F (36.2 C) 97.7 F (36.5 C)  98 F (36.7 C)  TempSrc: Oral Oral  Oral  SpO2: 99% 99%  100%  Weight:   89.5 kg   Height:       Physical Exam General: Elderly male, alert and in NAD Heart: RRR, no murmur Lungs: + expiratory wheeze in right lower lobe, no rhonchi or rales Abdomen: Soft, non-distended, +BS Extremities: trace edema RLE, 2+ nonpitting edema LLE, s/p great toe amputation, erythema near incision.  Dialysis Access: TDC, LUE AVF + t/b  Additional Objective Labs: Basic Metabolic Panel: Recent Labs  Lab 06/05/21 0741 06/07/21 1530 06/08/21 0253  NA 135 130* 133*  K 4.2 4.7 4.7  CL 96* 93* 92*  CO2 28 24 24   GLUCOSE 84 75 47*  BUN 8 20 23   CREATININE 2.74* 4.25* 4.41*  CALCIUM 8.8* 8.5* 8.6*  PHOS  --   --  3.3   Liver Function Tests: Recent Labs  Lab 06/07/21 1530 06/08/21 0253  AST 30 29  ALT 15 14  ALKPHOS 106 111  BILITOT 0.8 0.9  PROT 5.4* 5.9*  ALBUMIN 2.5* 2.6*   No results for input(s): LIPASE, AMYLASE in the last 168 hours. CBC: Recent Labs  Lab 06/03/21 1602 06/05/21 0741 06/07/21 1530 06/08/21 0601  WBC 7.6 4.2 5.0 5.6  NEUTROABS  --  2.7 3.3 4.4  HGB 10.2* 9.4* 9.3* 10.1*  HCT 31.7* 28.8* 28.0* 31.4*  MCV 96.9 97.0 95.2 96.3  PLT 241 169 182 176   Blood Culture    Component Value Date/Time   SDES BLOOD SITE NOT SPECIFIED 06/08/2021 0601   SPECREQUEST  06/08/2021 0601    BOTTLES DRAWN AEROBIC ONLY Blood Culture results may not be optimal due to an inadequate volume of blood received in culture bottles   CULT  06/08/2021 0601    NO GROWTH  < 12 HOURS Performed at [facility] [address], [place], [place] [address]    REPTSTATUS PENDING 06/08/2021 0601    Cardiac Enzymes: No results for input(s): CKTOTAL, CKMB, CKMBINDEX, TROPONINI in the last 168 hours. CBG: Recent Labs  Lab 06/03/21 1602 06/07/21 1526 06/08/21 0611 06/08/21 0814 06/08/21 1210  GLUCAP 63* 81 73 102* 110*   Iron Studies: No results for input(s): IRON, TIBC, TRANSFERRIN, FERRITIN in the last 72 hours. @lablastinr3 @ Studies/Results: CT Head Wo Contrast  Result Date: 06/07/2021 CLINICAL DATA:  [place] today.  Possibly hit his head. EXAM: CT HEAD WITHOUT CONTRAST CT CERVICAL SPINE WITHOUT CONTRAST TECHNIQUE: Multidetector CT imaging of the head and cervical spine was performed following the standard protocol without intravenous contrast. Multiplanar CT image reconstructions of the cervical spine were also generated. RADIATION DOSE REDUCTION: This exam was performed according to the departmental dose-optimization program which includes automated exposure control, adjustment of the mA and/or kV according to patient size and/or use of iterative reconstruction technique. COMPARISON:  Head CT dated 06/03/2021. Head and cervical spine CT dated 05/01/2021. FINDINGS: CT HEAD FINDINGS Brain: No significant change in moderate to marked diffuse enlargement of the ventricles and  subarachnoid spaces. Mild-to-moderate patchy white matter low density in both cerebral hemispheres without significant change. No intracranial hemorrhage, mass lesion or CT evidence of acute infarction. Vascular: No hyperdense vessel or unexpected calcification. Atheromatous arterial calcifications at the skull base. Skull: Normal. Negative for fracture or focal lesion. Sinuses/Orbits: Status post right cataract extraction. Bilateral anterior ethmoid sinus mucosal thickening. Mild left maxillary sinus mucosal thickening. Other: None. CT CERVICAL SPINE FINDINGS Alignment: Stable reversal of the  normal cervical lordosis. No subluxations. Skull base and vertebrae: No acute fracture. No primary bone lesion or focal pathologic process. Soft tissues and spinal canal: No prevertebral fluid or swelling. No visible canal hematoma. Disc levels:  Stable multilevel degenerative changes. Upper chest: Clear lung apices. Other: Bilateral carotid artery calcifications. IMPRESSION: 1. No skull fracture or intracranial hemorrhage. 2. No cervical spine fracture or subluxation. 3. Stable diffuse cerebral and cerebellar atrophy in chronic small vessel white matter ischemic changes. 4. Stable reversal of the normal cervical lordosis and multilevel cervical spine degenerative changes. 5. Bilateral carotid artery atheromatous calcifications. Electronically Signed   By: Claudie Revering M.D.   On: 06/07/2021 16:38   CT Cervical Spine Wo Contrast  Result Date: 06/07/2021 CLINICAL DATA:  [place] today.  Possibly hit his head. EXAM: CT HEAD WITHOUT CONTRAST CT CERVICAL SPINE WITHOUT CONTRAST TECHNIQUE: Multidetector CT imaging of the head and cervical spine was performed following the standard protocol without intravenous contrast. Multiplanar CT image reconstructions of the cervical spine were also generated. RADIATION DOSE REDUCTION: This exam was performed according to the departmental dose-optimization program which includes automated exposure control, adjustment of the mA and/or kV according to patient size and/or use of iterative reconstruction technique. COMPARISON:  Head CT dated 06/03/2021. Head and cervical spine CT dated 05/01/2021. FINDINGS: CT HEAD FINDINGS Brain: No significant change in moderate to marked diffuse enlargement of the ventricles and subarachnoid spaces. Mild-to-moderate patchy white matter low density in both cerebral hemispheres without significant change. No intracranial hemorrhage, mass lesion or CT evidence of acute infarction. Vascular: No hyperdense vessel or unexpected calcification. Atheromatous  arterial calcifications at the skull base. Skull: Normal. Negative for fracture or focal lesion. Sinuses/Orbits: Status post right cataract extraction. Bilateral anterior ethmoid sinus mucosal thickening. Mild left maxillary sinus mucosal thickening. Other: None. CT CERVICAL SPINE FINDINGS Alignment: Stable reversal of the normal cervical lordosis. No subluxations. Skull base and vertebrae: No acute fracture. No primary bone lesion or focal pathologic process. Soft tissues and spinal canal: No prevertebral fluid or swelling. No visible canal hematoma. Disc levels:  Stable multilevel degenerative changes. Upper chest: Clear lung apices. Other: Bilateral carotid artery calcifications. IMPRESSION: 1. No skull fracture or intracranial hemorrhage. 2. No cervical spine fracture or subluxation. 3. Stable diffuse cerebral and cerebellar atrophy in chronic small vessel white matter ischemic changes. 4. Stable reversal of the normal cervical lordosis and multilevel cervical spine degenerative changes. 5. Bilateral carotid artery atheromatous calcifications. Electronically Signed   By: Claudie Revering M.D.   On: 06/07/2021 16:38   MR FOOT LEFT WO CONTRAST  Result Date: 06/08/2021 CLINICAL DATA:  Osteomyelitis, foot EXAM: MRI OF THE LEFT FOOT WITHOUT CONTRAST TECHNIQUE: Multiplanar, multisequence MR imaging of the left forefoot was performed. No intravenous contrast was administered. COMPARISON:  Left foot radiograph 06/07/2021 FINDINGS: Bones/Joint/Cartilage Prior great toe amputation. There is bony edema and confluent low T1 signal within the second toe proximal phalanx and mid to distal second metatarsal. The second toe proximal phalanx is dorsally dislocated from the second metatarsal head. There is  edema signal within the first and third metatarsal heads with preserved T1 signal. Similar findings but to a lesser degree in the fourth metatarsal head. There is a subacute fracture of the third metatarsal head/neck with mild  angulation. Ligaments Surgically disrupted first MTP ligaments. Torn second MTP plantar plate and collateral ligaments. The third through fifth MTP collateral ligaments appear intact without evidence of plantar plate tear. The Lisfranc ligament appears intact. Muscles and Tendons Diffuse intramuscular edema and muscle atrophy in the foot as is commonly seen in diabetics. Soft tissues Diffuse soft tissue swelling of the foot. There is some localized fluid in the first webspace between the first and second metatarsal heads, not well-defined. 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 Dorsal dislocation of the second MTP joint. Bony edema in the adjacent first and third metatarsal heads which is favored to be reactive, though early osteomyelitis could have a similar appearance. Subacute third metatarsal head/neck fracture with mild angulation, accounting for some of the aforementioned bony edema. Localized fluid in the first webspace between the first and second metatarsal heads, not well-defined but could represent developing phlegmon/abscess. Reactive mild edema in the fourth metatarsal head. Electronically Signed   By: Maurine Simmering M.D.   On: 06/08/2021 09:28   DG Chest Portable 1 View  Result Date: 06/07/2021 CLINICAL DATA:  Concern for pneumonia EXAM: PORTABLE CHEST 1 VIEW COMPARISON:  Chest x-ray 06/03/2021 FINDINGS: Cardiomegaly and mediastinum are stable. Pulmonary vasculature appears within normal limits. No focal consolidation identified. Prominent chronic interstitial lung markings similar to previous. No significant pleural effusion visualized. No pneumothorax. IMPRESSION: Cardiomegaly with no acute process identified. Electronically Signed   By: Ofilia Neas M.D.   On: 06/07/2021 16:12   DG Foot Complete Left  Result Date: 06/07/2021 CLINICAL DATA:  Left great toe amputation, EXAM: LEFT FOOT - COMPLETE 3+ VIEW COMPARISON:  05/13/2021  FINDINGS: Frontal, oblique, lateral views of the left foot are obtained. Prior amputation at the level of the first metatarsophalangeal joint. There is diffuse soft tissue swelling throughout the forefoot and remaining digits, as well as at the surgical site. On the lateral view, there is evidence of bony destruction at the base of the second proximal phalanx concerning for osteomyelitis. No other acute or destructive bony lesions. Stable osteoarthritis. Stable postsurgical changes of the left ankle. IMPRESSION: 1. Interval amputation at the first metatarsophalangeal joint. 2. Bony destruction at the base of the second proximal phalanx, best seen on lateral view, concerning for osteomyelitis. 3. Progressive soft tissue swelling throughout the left forefoot and remaining digits. Electronically Signed   By: Randa Ngo M.D.   On: 06/07/2021 19:20   Medications:  ceFEPime (MAXIPIME) IV 1 g (06/08/21 1952)   metronidazole 500 mg (06/09/21 0701)   vancomycin      amiodarone  200 mg Oral Daily   atorvastatin  20 mg Oral Daily   carvedilol  6.25 mg Oral BID   Chlorhexidine Gluconate Cloth  6 each Topical Q0600   loratadine  10 mg Oral Daily   molnupiravir EUA  4 capsule Oral BID   pantoprazole  40 mg Oral Daily   pneumococcal 20-valent conjugate vaccine  0.5 mL Intramuscular Tomorrow-1000   sodium bicarbonate  650 mg Oral BID    Dialysis Orders: Center: [facility]  on MWF . 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 On IV vancomycin and rocephin. Management per primary team/ortho. Recurrent falls: Likely 2/2 osteomyelitis and norco. PT/OT consulted. No evidence of hypotension and pt denies dizziness.  COVID 19: tested positve for COVID-19 on 06/03/21 and was started on molnupirvavir.  Symptoms improving. Management per primary team.   ESRD:  MWF schedule. Tolerated 3L UF with HD yesterday. Volume overload but improved compared to his baseline. Respiratory symptoms are more likely due to COVID. Will plan for short HD today to get back on schedule, as staffing allows.   Hypertension/volume: BP elevated. On amiodarone and torsemide outpatient. Reports he still makes urine so will continue torsemide. Has been getting below his outpatient EDW and seems to be losing some weight. UF with HD as tolerated.   Anemia: Hgb 10.1. Not due for ESA. Will hold further IV iron until acute infection is resolved.  Metabolic bone disease: Calcium and phos controlled. Not on VDRA.   Nutrition:  Renal diet/fluid restrictions  Anice Paganini, PA-C 06/09/2021, 9:21 AM  [facility] Pager: [phone]

## 2021-06-09 NOTE — Evaluation (Signed)
Occupational Therapy Evaluation Patient Details Name: Darryl Diaz MRN: [id number] DOB: March 17, 1941 Today's Date: 06/09/2021   History of Present Illness Pt is a 81 y.o. male admitted from [facility] on 06/07/21 with c/o balance issues and frequent falls; foot MRI showed L second phalanx to second metatarsal osteomyelitis. Pt with (+) COVID-19 on 1/26. 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   Clinical Impression   Pt reports independence at baseline with ADLs and functional mobility, from [place]. Pt currently min A or ADLS, supervision for bed mobility, and min guard for transfers. Pt presenting difficulty with adhering to TTWB status, states he does not have any precautions/ambulates without precaution at home and was unaware of TTWB. Ortho consult note entered after session, pt now updated to LLE NWB. Pt states he was not aware of this after meeting with surgeon this AM. After session, returned to room to educate pt on updated LLE NWB precautions, RN also notified.  Pt presenting with impairments listed below, will continue to follow acutely to maximize safety and independence with ADLs/functional mobility. Pt remains a fall risk and has had difficulty adhering to precautions since surgery on 1/11. Recommend SNF at d/c.     Recommendations for follow up therapy are one component of a multi-disciplinary discharge planning process, led by the attending physician.  Recommendations may be updated based on patient status, additional functional criteria and insurance authorization.   Follow Up Recommendations  Skilled nursing-short term rehab (<3 hours/day)    Assistance Recommended at Discharge Intermittent Supervision/Assistance  Patient can return home with the following A lot of help with walking and/or transfers;A little help with  bathing/dressing/bathroom;Assistance with cooking/housework;Help with stairs or ramp for entrance    Functional Status Assessment  Patient has had a recent decline in their functional status and demonstrates the ability to make significant improvements in function in a reasonable and predictable amount of time.  Equipment Recommendations  None recommended by OT;Other (comment) (defer to next venue of care)    Recommendations for Other Services       Precautions / Restrictions Precautions Precautions: Fall Required Braces or Orthoses: Other Brace Other Brace: DARCO post-op shoe, pt does not have in room Restrictions Weight Bearing Restrictions: Yes LLE Weight Bearing: Non weight bearing Other Position/Activity Restrictions: pt reports no WB precautions although op note from Dr. Sharol Given on 1/11 states TDWB. Pt does report he is to utilize post-op show, LLE NWB per Dr. Sharol Given updated note as of 2/1      Mobility Bed Mobility Overal bed mobility: Needs Assistance Bed Mobility: Supine to Sit, Sit to Supine     Supine to sit: Supervision Sit to supine: Supervision        Transfers Overall transfer level: Needs assistance Equipment used: Rolling walker (2 wheels) Transfers: Sit to/from Stand, Bed to chair/wheelchair/BSC Sit to Stand: Min guard                  Balance Overall balance assessment: Needs assistance Sitting-balance support: No upper extremity supported, Feet supported Sitting balance-Leahy Scale: Good     Standing balance support: No upper extremity supported, During functional activity Standing balance-Leahy Scale: Fair                             ADL either performed or assessed with clinical  judgement   ADL Overall ADL's : Needs assistance/impaired Eating/Feeding: Set up;Sitting   Grooming: Set up;Sitting   Upper Body Bathing: Minimal assistance;Sitting   Lower Body Bathing: Minimal assistance;Sitting/lateral leans   Upper Body  Dressing : Minimal assistance;Sitting   Lower Body Dressing: Maximal assistance;Bed level Lower Body Dressing Details (indicate cue type and reason): donning socks Toilet Transfer: Min guard;Rolling walker (2 wheels);Ambulation;Regular Toilet   Toileting- Water quality scientist and Hygiene: Supervision/safety;Sit to/from stand Toileting - Clothing Manipulation Details (indicate cue type and reason): able to manipulate clothing prior to sitting on commode     Functional mobility during ADLs: Min guard;Rolling walker (2 wheels)       Vision   Vision Assessment?: No apparent visual deficits     Perception     Praxis      Pertinent Vitals/Pain Pain Assessment Pain Assessment: No/denies pain     Hand Dominance Right   Extremity/Trunk Assessment Upper Extremity Assessment Upper Extremity Assessment: Overall WFL for tasks assessed   Lower Extremity Assessment Lower Extremity Assessment: Defer to PT evaluation       Communication Communication Communication: No difficulties   Cognition Arousal/Alertness: Awake/alert Behavior During Therapy: WFL for tasks assessed/performed Overall Cognitive Status: Impaired/Different from baseline Area of Impairment: Memory, Awareness                     Memory: Decreased recall of precautions     Awareness: Emergent         General Comments  VSS on RA    Exercises     Shoulder Instructions      Home Living Family/patient expects to be discharged to:: Unsure                                 Additional Comments: lives in [place] at [place]      Prior Functioning/Environment Prior Level of Function : History of Falls (last six months);Independent/Modified Independent             Mobility Comments: uses rollator ADLs Comments: was sponge bathing, reports getting in the shower ~2 weeks ago with assist of [facility] staff        OT Problem List: Decreased strength;Decreased range of  motion;Decreased activity tolerance;Impaired balance (sitting and/or standing);Decreased safety awareness;Decreased knowledge of use of DME or AE;Pain      OT Treatment/Interventions: Self-care/ADL training;Therapeutic exercise;DME and/or AE instruction;Therapeutic activities;Patient/family education;Balance training    OT Goals(Current goals can be found in the care plan section) Acute Rehab OT Goals Patient Stated Goal: to go home OT Goal Formulation: With patient Time For Goal Achievement: 06/23/21 Potential to Achieve Goals: Good  OT Frequency: Min 2X/week    Co-evaluation              AM-PAC OT "6 Clicks" Daily Activity     Outcome Measure Help from another person eating meals?: None Help from another person taking care of personal grooming?: A Little Help from another person toileting, which includes using toliet, bedpan, or urinal?: A Little Help from another person bathing (including washing, rinsing, drying)?: A Lot Help from another person to put on and taking off regular upper body clothing?: A Lot Help from another person to put on and taking off regular lower body clothing?: A Lot 6 Click Score: 16   End of Session Equipment Utilized During Treatment: Gait belt;Rolling walker (2 wheels) Nurse Communication: Mobility status;Precautions (RN notified of updated WB  precautions)  Activity Tolerance: Patient tolerated treatment well Patient left: in chair;with call bell/phone within reach;with chair alarm set;with nursing/sitter in room  OT Visit Diagnosis: Repeated falls (R29.6);Other abnormalities of gait and mobility (R26.89);Unsteadiness on feet (R26.81);Muscle weakness (generalized) (M62.81);History of falling (Z91.81)                Time: 3943-2003 OT Time Calculation (min): 40 min Charges:  OT General Charges $OT Visit: 1 Visit OT Evaluation $OT Eval Low Complexity: 1 Low OT Treatments $Self Care/Home Management : 23-37 mins  Lynnda Child, OTD,  OTR/L Acute Rehab [phone] - Fernando Salinas 06/09/2021, 9:02 AM

## 2021-06-10 DIAGNOSIS — M86272 Subacute osteomyelitis, left ankle and foot: Secondary | ICD-10-CM | POA: Diagnosis not present

## 2021-06-10 DIAGNOSIS — W19XXXA Unspecified fall, initial encounter: Secondary | ICD-10-CM

## 2021-06-10 LAB — D-DIMER, QUANTITATIVE: D-Dimer, Quant: 1.19 ug/mL-FEU — ABNORMAL HIGH (ref 0.00–0.50)

## 2021-06-10 LAB — C-REACTIVE PROTEIN: CRP: 3.2 mg/dL — ABNORMAL HIGH (ref <1.0)

## 2021-06-10 MED ORDER — METRONIDAZOLE 500 MG PO TABS: 500.0000 mg | ORAL_TABLET | Freq: Two times a day (BID) | ORAL | 0 refills | Status: AC

## 2021-06-10 MED ORDER — SODIUM CHLORIDE 0.9 % IV SOLN
1.0000 g | INTRAVENOUS | Status: DC
  Administered 2021-06-10 – 2021-06-11 (×2): 1 g via INTRAVENOUS
  Filled 2021-06-10 (×2): qty 1

## 2021-06-10 MED ORDER — CHLORHEXIDINE GLUCONATE CLOTH 2 % EX PADS
6.0000 | MEDICATED_PAD | Freq: Every day | CUTANEOUS | Status: DC
  Administered 2021-06-10 – 2021-06-13 (×4): 6 via TOPICAL

## 2021-06-10 MED ORDER — VANCOMYCIN HCL 750 MG/150ML IV SOLN: 750.0000 mg | INTRAVENOUS | Status: AC

## 2021-06-10 MED ORDER — VANCOMYCIN HCL 750 MG/150ML IV SOLN
750.0000 mg | Freq: Once | INTRAVENOUS | Status: AC
  Administered 2021-06-10: 750 mg via INTRAVENOUS
  Filled 2021-06-10: qty 150

## 2021-06-10 MED ORDER — IPRATROPIUM-ALBUTEROL 0.5-2.5 (3) MG/3ML IN SOLN
3.0000 mL | Freq: Two times a day (BID) | RESPIRATORY_TRACT | Status: DC
  Administered 2021-06-10 – 2021-06-13 (×6): 3 mL via RESPIRATORY_TRACT
  Filled 2021-06-10 (×8): qty 3

## 2021-06-10 MED ORDER — ALBUTEROL SULFATE (2.5 MG/3ML) 0.083% IN NEBU: 2.5000 mg | INHALATION_SOLUTION | Freq: Four times a day (QID) | RESPIRATORY_TRACT | Status: DC | PRN

## 2021-06-10 MED ORDER — CYANOCOBALAMIN 500 MCG PO TABS: 500.0000 ug | ORAL_TABLET | Freq: Every day | ORAL | 1 refills | Status: DC

## 2021-06-10 MED ORDER — CEFEPIME HCL 1 G IJ SOLR
1.0000 g | INTRAMUSCULAR | Status: DC
Start: 2021-06-11 — End: 2021-06-10

## 2021-06-10 MED ORDER — SODIUM CHLORIDE 0.9 % IV SOLN: 2.0000 g | INTRAVENOUS | Status: AC

## 2021-06-10 NOTE — Progress Notes (Addendum)
D/C order noted. Contacted [facility] and spoke to [place], Therapist, sports. Clinic advised that pt will d/c today and resume care tomorrow. Clinic aware of pt's covid diagnosis and there will be no change in appt days/time. Inquired if clinic could provide pt's HD treatment today since pt did not receive treatment yesterday on schedule. Clinic states they do not have any availability for today but pt can resume pt's care tomorrow on pt's regular schedule.    Melven Sartorius Renal Navigator [phone]  Addendum at 11:14 am: Contacted by [facility] staff to say that pt will admit to snf at [facility] at d/c. Pt receives out-pt HD at [facility] ([facility]) on MWF. Pt needs to arrive at 11:15-11:30 for 11:45 chair time. Info to be provided to [facility] staff to provide to pt's snf.   Addendum at 12:02 pm: SNF cannot accept pt until Monday. Contacted [facility] and made staff aware pt will not resume tomorrow as planned.

## 2021-06-10 NOTE — Progress Notes (Signed)
PHARMACY CONSULT NOTE FOR:  OUTPATIENT  PARENTERAL ANTIBIOTIC THERAPY (OPAT)  Informational purposes only - patient to receive antibiotics with hemodialysis. Nephrology team informed.   Indication: L-foot osteo Regimen: Vancomycin 1g/HD-MWF and Cefepime 2g/HD-MWF End date: 07/19/21  IV antibiotic discharge orders are pended. To discharging provider:  please sign these orders via discharge navigator,  Select New Orders & click on the button choice - Manage This Unsigned Work.     Thank you for allowing pharmacy to be a part of this patients care.  Alycia Rossetti, PharmD, BCPS Clinical Pharmacist 06/10/2021 12:59 PM   **Pharmacist phone directory can now be found on [email or web address] ([email or web address]).  Listed under [place].

## 2021-06-10 NOTE — Care Management Important Message (Signed)
Important Message  Patient Details  Name: Darryl Diaz MRN: [id number] Date of Birth: 12-31-1940   Medicare Important Message Given:  Yes   IM Letter mailed to patient's address due to isolation status.  Levada Dy  Griffith Santilli-Martin 06/10/2021, 1:05 PM

## 2021-06-10 NOTE — Progress Notes (Signed)
[facility] Progress Note   Subjective:   Reports feeling "very good," cough is improving and pt denies any SOB or wheezing. Denies CP, palpitations, dizziness, and nausea. No foot pain at present.   Objective Vitals:   06/09/21 2308 06/10/21 0250 06/10/21 0413 06/10/21 0800  BP: (!) 168/59  (!) 115/48 102/80  Pulse: 61  (!) 57 60  Resp: 19  17 17   Temp: 98.2 F (36.8 C)  98 F (36.7 C) 97.7 F (36.5 C)  TempSrc: Oral  Oral Oral  SpO2: 94% 95% 95% 96%  Weight:      Height:       Physical Exam General: Elderly male, alert and in NAD Heart: RRR, no murmur Lungs: Lungs CTA bilaterally without wheezing, rhonchi or rales Abdomen: Soft, non-distended, +BS Extremities: trace edema RLE, 2+ nonpitting edema LLE, foot wrapped Dialysis Access: TDC, LUE AVF + t/b  Additional Objective Labs: Basic Metabolic Panel: Recent Labs  Lab 06/07/21 1530 06/08/21 0253 06/09/21 2204  NA 130* 133* 135  K 4.7 4.7 3.5  CL 93* 92* 100  CO2 24 24 23   GLUCOSE 75 47* 125*  BUN 20 23 23   CREATININE 4.25* 4.41* 4.23*  CALCIUM 8.5* 8.6* 8.2*  PHOS  --  3.3 3.0   Liver Function Tests: Recent Labs  Lab 06/07/21 1530 06/08/21 0253 06/09/21 2204  AST 30 29  --   ALT 15 14  --   ALKPHOS 106 111  --   BILITOT 0.8 0.9  --   PROT 5.4* 5.9*  --   ALBUMIN 2.5* 2.6* 2.3*   No results for input(s): LIPASE, AMYLASE in the last 168 hours. CBC: Recent Labs  Lab 06/03/21 1602 06/05/21 0741 06/07/21 1530 06/08/21 0601 06/09/21 2204  WBC 7.6 4.2 5.0 5.6 4.2  NEUTROABS  --  2.7 3.3 4.4  --   HGB 10.2* 9.4* 9.3* 10.1* 9.4*  HCT 31.7* 28.8* 28.0* 31.4* 29.5*  MCV 96.9 97.0 95.2 96.3 96.1  PLT 241 169 182 176 157   Blood Culture    Component Value Date/Time   SDES BLOOD SITE NOT SPECIFIED 06/08/2021 0601   SPECREQUEST  06/08/2021 0601    BOTTLES DRAWN AEROBIC ONLY Blood Culture results may not be optimal due to an inadequate volume of blood received in culture bottles   CULT   06/08/2021 0601    NO GROWTH 1 DAY Performed at [facility], [place] [address] [place], [place] [address]    REPTSTATUS PENDING 06/08/2021 0601    Cardiac Enzymes: No results for input(s): CKTOTAL, CKMB, CKMBINDEX, TROPONINI in the last 168 hours. CBG: Recent Labs  Lab 06/03/21 1602 06/07/21 1526 06/08/21 0611 06/08/21 0814 06/08/21 1210  GLUCAP 63* 81 73 102* 110*   Iron Studies: No results for input(s): IRON, TIBC, TRANSFERRIN, FERRITIN in the last 72 hours. @lablastinr3 @ Studies/Results: No results found. Medications:  ceFEPime (MAXIPIME) IV 1 g (06/10/21 0034)   metronidazole 500 mg (06/10/21 0543)   vancomycin      amiodarone  200 mg Oral Daily   atorvastatin  20 mg Oral Daily   carvedilol  6.25 mg Oral BID   Chlorhexidine Gluconate Cloth  6 each Topical Q0600   ipratropium-albuterol  3 mL Nebulization Q6H   loratadine  10 mg Oral Daily   pantoprazole  40 mg Oral Daily   sodium bicarbonate  650 mg Oral BID   vitamin B-12  500 mcg Oral Daily    Dialysis Orders: Center: [facility]  on MWF . 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 On IV antibiotics. Management per primary team/ortho. Recurrent falls: Likely 2/2 osteomyelitis and norco. PT/OT consulted. No evidence of hypotension and pt denies dizziness.  COVID 19: tested positve for COVID-19 on 06/03/21 and was started on molnupirvavir. Symptoms improving. Management per primary team.   ESRD:  MWF schedule. Did not receive HD yesterday due to staffing, treatment postponed to today. Volume overload but improved compared to his baseline. Respiratory symptoms are more likely due to COVID.   Hypertension/volume: BP elevated. On amiodarone and torsemide outpatient. Reports he still makes urine so will  continue torsemide. Has been getting below his outpatient EDW and seems to be losing some weight. UF with HD as tolerated.   Anemia: Hgb 9.4. Not due for ESA. Will hold further IV iron until acute infection is resolved.  Metabolic bone disease: Calcium and phos controlled. Not on VDRA.   Nutrition:  Renal diet/fluid restrictions    Anice Paganini, PA-C 06/10/2021, 9:16 AM  [facility] Pager: [phone]

## 2021-06-10 NOTE — Progress Notes (Signed)
OT Cancellation Note  Patient Details Name: Darryl Diaz MRN: [id number] DOB: 10-29-1940   Cancelled Treatment:    Reason Eval/Treat Not Completed: Other (comment). Pt just received breakfast and RT just into room for breathing treatment.  [place], OTR/L Acute Rehab Services Pager [phone] Office [phone]    Almon Register 06/10/2021, 8:47 AM

## 2021-06-10 NOTE — Discharge Summary (Addendum)
[facility]  Physician Discharge Summary   Patient ID: Darryl Diaz MRN: [id number] DOB/AGE: 07-30-1940 81 y.o.  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 Sharol Given in 1 week Infectious disease will arrange outpatient follow-up Patient to get antibiotics with hemodialysis     Home Health: PT and OT along with RN Equipment/Devices: None  CODE STATUS: DNR  DISCHARGE CONDITION: fair  Diet recommendation: Renal diet as before  INITIAL HISTORY: Darryl Diaz is a 81 y.o. male with medical history significant for ESRD on HD MWF (still makes urine), paroxysmal atrial fibrillation not on anticoagulation, hypertension, anemia of chronic kidney disease with associated baseline hemoglobin 8-11K, s/p left great toe amputation, who is admitted to [facility] on 06/07/2021 with concern for osteomyelitis of the left second toe after presenting from home to [facility] ED complaining of balance issues and frequent falls.  Covid-19 screening test positive on 06/03/21, started on antiviral Molnupiravir on admission.  On broad spectrum IV antibiotics for ?left foot osteomyelitis.  Seen by orthopedic surgery, no plan for surgical intervention at this time.  Infectious disease consulted to assist with the management.   06/09/2021: Patient was seen at his bedside.  No significant pain in his left foot.  He is on broad-spectrum IV antibiotics.  Will be seen by infectious  disease for management of his left foot cellulitis/?Osteomyelitis.  Consultations: Orthopedics Infectious disease Nephrology  Procedures: Hemodialysis    HOSPITAL COURSE:   Left foot cellulitis, second toe proximal phalanx and mid to distal second metatarsal ?osteomyelitis seen on left foot MRI:  S/p left great toe amputation. Follows with Dr. Sharol Given outpatient. Patient underwent MRI.  Was seen by Dr. Sharol Given.  Also seen by infectious disease due to concern for osteomyelitis.  No surgical intervention is planned.  Patient was placed on vancomycin cefepime and metronidazole.  Cellulitis has improved.  Patient feels better.  Plan is to treat with vancomycin and cefepime for 6 weeks with hemodialysis.  Also to treat with metronidazole orally for 6 weeks.  Patient seen by physical therapy.  He wants to go back to his independent living facility with home health.  This is being ordered.  Pain is reasonably well controlled.   Frequent falls with imbalance of unclear:  Multiple ground-level falls.  Will need ongoing rehab with home health.  Vitamin B12 level was 954. Avoid benzodiazepine and opiates as able   Recent COVID-19 positive finding:  Covid-19 Positive 06/03/21 CXR cardiomegaly with no acute cardiopulmonary process. Did not have any respiratory symptoms.  Patient was treated with Molupiravir.  Will defer further duration of isolation to his independent living facility.   Mild hypervolemic hyponatremia Resolved.   Generalized weakness/Physical debility:  Likely contributed by acute viral illness in the setting of multiple comorbidities. Home health ordered.   ESRD on HD MWF Nephrology was consulted.  Hopefully will be dialyzed today prior to discharge.  May resume his usual Monday Wednesday Friday schedule from tomorrow.   Paroxysmal atrial fibrillation, not on oral anticoagulation:  CHA2DS2-VASc score of 6, there is an indication for chronic  anticoagulation for thromboembolic  prophylaxis.  However, in the setting of a history of gastrointestinal bleed, the patient is not currently on any chronic anticoagulation. Continue home Coreg and amiodarone.  Stable.  Continue home meds  GERD:  Continue home PPI   Essential Hypertension:  BP is not at goal, elevated.   Continue home oral antihypertensives  Monitor vital signs  IV antihypertensive as needed with parameters.     Anemia of chronic disease:  Stable.     Physical debility Home health has been ordered   Patient is stable.  Okay for discharge back to ALF today after hemodialysis   PERTINENT LABS:  The results of significant diagnostics from this hospitalization (including imaging, microbiology, ancillary and laboratory) are listed below for reference.    Microbiology: Recent Results (from the past 240 hour(s))  Resp Panel by RT-PCR (Flu A&B, Covid) Nasopharyngeal Swab     Status: Abnormal   Collection Time: 06/03/21  6:13 PM   Specimen: Nasopharyngeal Swab; Nasopharyngeal(NP) swabs in vial transport medium  Result Value Ref Range Status   SARS Coronavirus 2 by RT PCR POSITIVE (A) NEGATIVE Final    Comment: (NOTE) SARS-CoV-2 target nucleic acids are DETECTED.  The SARS-CoV-2 RNA is generally detectable in upper respiratory specimens during the acute phase of infection. Positive results are indicative of the presence of the identified virus, but do not rule out bacterial infection or co-infection with other pathogens not detected by the test. Clinical correlation with patient history and other diagnostic information is necessary to determine patient infection status. The expected result is Negative.  Fact Sheet for Patients: EntrepreneurPulse.com.au  Fact Sheet for Healthcare Providers: IncredibleEmployment.be  This test is not yet approved or cleared by the [place] FDA and  has been authorized for detection and/or diagnosis of SARS-CoV-2 by FDA  under an Emergency Use Authorization (EUA).  This EUA will remain in effect (meaning this test can be used) for the duration of  the COVID-19 declaration under Section 564(b)(1) of the A ct, 21 U.S.C. section 360bbb-3(b)(1), unless the authorization is terminated or revoked sooner.     Influenza A by PCR NEGATIVE NEGATIVE Final   Influenza B by PCR NEGATIVE NEGATIVE Final    Comment: (NOTE) The Xpert Xpress SARS-CoV-2/FLU/RSV plus assay is intended as an aid in the diagnosis of influenza from Nasopharyngeal swab specimens and should not be used as a sole basis for treatment. Nasal washings and aspirates are unacceptable for Xpert Xpress SARS-CoV-2/FLU/RSV testing.  Fact Sheet for Patients: EntrepreneurPulse.com.au  Fact Sheet for Healthcare Providers: IncredibleEmployment.be  This test is not yet approved or cleared by the [place] FDA and has been authorized for detection and/or diagnosis of SARS-CoV-2 by FDA under an Emergency Use Authorization (EUA). This EUA will remain in effect (meaning this test can be used) for the duration of the COVID-19 declaration under Section 564(b)(1) of the Act, 21 U.S.C. section 360bbb-3(b)(1), unless the authorization is terminated or revoked.  Performed at [facility], [place] [address], [place], [place] [address]   Blood culture (routine x 2)     Status: None (Preliminary result)   Collection Time: 06/07/21 11:04 PM   Specimen: BLOOD  Result Value Ref Range Status   Specimen Description BLOOD RIGHT HAND  Final   Special Requests   Final    BOTTLES DRAWN AEROBIC AND ANAEROBIC Blood Culture results may not be optimal due to an inadequate volume of blood received in culture bottles   Culture   Final  NO GROWTH 3 DAYS Performed at [facility], [place] [address], [place], [place] [address]    Report Status PENDING  Incomplete  Blood culture (routine x 2)     Status: None (Preliminary result)    Collection Time: 06/08/21  6:01 AM   Specimen: BLOOD  Result Value Ref Range Status   Specimen Description BLOOD SITE NOT SPECIFIED  Final   Special Requests   Final    BOTTLES DRAWN AEROBIC ONLY Blood Culture results may not be optimal due to an inadequate volume of blood received in culture bottles   Culture   Final    NO GROWTH 2 DAYS Performed at [facility], [place] [address], [place], [place] [address]    Report Status PENDING  Incomplete     Labs:  COVID-19 Labs  Recent Labs    06/07/21 2304 06/10/21 0157  DDIMER  --  1.19*  CRP 4.1* 3.2*    Lab Results  Component Value Date   SARSCOV2NAA POSITIVE (A) 06/03/2021   [place] NEGATIVE 05/01/2021   [place] NEGATIVE 01/20/2021   [place] NEGATIVE 01/17/2021      Basic Metabolic Panel: Recent Labs  Lab 06/03/21 1602 06/05/21 0741 06/07/21 1530 06/08/21 0253 06/09/21 2204  NA 134* 135 130* 133* 135  K 3.9 4.2 4.7 4.7 3.5  CL 93* 96* 93* 92* 100  CO2 29 28 24 24 23   GLUCOSE 77 84 75 47* 125*  BUN 11 8 20 23 23   CREATININE 2.90* 2.74* 4.25* 4.41* 4.23*  CALCIUM 8.5* 8.8* 8.5* 8.6* 8.2*  MG  --   --  1.9 2.0  --   PHOS  --   --   --  3.3 3.0   Liver Function Tests: Recent Labs  Lab 06/07/21 1530 06/08/21 0253 06/09/21 2204  AST 30 29  --   ALT 15 14  --   ALKPHOS 106 111  --   BILITOT 0.8 0.9  --   PROT 5.4* 5.9*  --   ALBUMIN 2.5* 2.6* 2.3*    CBC: Recent Labs  Lab 06/03/21 1602 06/05/21 0741 06/07/21 1530 06/08/21 0601 06/09/21 2204  WBC 7.6 4.2 5.0 5.6 4.2  NEUTROABS  --  2.7 3.3 4.4  --   HGB 10.2* 9.4* 9.3* 10.1* 9.4*  HCT 31.7* 28.8* 28.0* 31.4* 29.5*  MCV 96.9 97.0 95.2 96.3 96.1  PLT 241 169 182 176 157    CBG: Recent Labs  Lab 06/03/21 1602 06/07/21 1526 06/08/21 0611 06/08/21 0814 06/08/21 1210  GLUCAP 63* 81 73 102* 110*     IMAGING STUDIES   CT Head Wo Contrast  Result Date: 06/07/2021 CLINICAL DATA:  [place] today.  Possibly hit his head.  EXAM: CT HEAD WITHOUT CONTRAST CT CERVICAL SPINE WITHOUT CONTRAST TECHNIQUE: Multidetector CT imaging of the head and cervical spine was performed following the standard protocol without intravenous contrast. Multiplanar CT image reconstructions of the cervical spine were also generated. RADIATION DOSE REDUCTION: This exam was performed according to the departmental dose-optimization program which includes automated exposure control, adjustment of the mA and/or kV according to patient size and/or use of iterative reconstruction technique. COMPARISON:  Head CT dated 06/03/2021. Head and cervical spine CT dated 05/01/2021. FINDINGS: CT HEAD FINDINGS Brain: No significant change in moderate to marked diffuse enlargement of the ventricles and subarachnoid spaces. Mild-to-moderate patchy white matter low density in both cerebral hemispheres without significant change. No intracranial hemorrhage, mass lesion or CT evidence of acute infarction. Vascular: No  hyperdense vessel or unexpected calcification. Atheromatous arterial calcifications at the skull base. Skull: Normal. Negative for fracture or focal lesion. Sinuses/Orbits: Status post right cataract extraction. Bilateral anterior ethmoid sinus mucosal thickening. Mild left maxillary sinus mucosal thickening. Other: None. CT CERVICAL SPINE FINDINGS Alignment: Stable reversal of the normal cervical lordosis. No subluxations. Skull base and vertebrae: No acute fracture. No primary bone lesion or focal pathologic process. Soft tissues and spinal canal: No prevertebral fluid or swelling. No visible canal hematoma. Disc levels:  Stable multilevel degenerative changes. Upper chest: Clear lung apices. Other: Bilateral carotid artery calcifications. IMPRESSION: 1. No skull fracture or intracranial hemorrhage. 2. No cervical spine fracture or subluxation. 3. Stable diffuse cerebral and cerebellar atrophy in chronic small vessel white matter ischemic changes. 4. Stable reversal  of the normal cervical lordosis and multilevel cervical spine degenerative changes. 5. Bilateral carotid artery atheromatous calcifications. Electronically Signed   By: Claudie Revering M.D.   On: 06/07/2021 16:38    CT Cervical Spine Wo Contrast  Result Date: 06/07/2021 CLINICAL DATA:  [place] today.  Possibly hit his head. EXAM: CT HEAD WITHOUT CONTRAST CT CERVICAL SPINE WITHOUT CONTRAST TECHNIQUE: Multidetector CT imaging of the head and cervical spine was performed following the standard protocol without intravenous contrast. Multiplanar CT image reconstructions of the cervical spine were also generated. RADIATION DOSE REDUCTION: This exam was performed according to the departmental dose-optimization program which includes automated exposure control, adjustment of the mA and/or kV according to patient size and/or use of iterative reconstruction technique. COMPARISON:  Head CT dated 06/03/2021. Head and cervical spine CT dated 05/01/2021. FINDINGS: CT HEAD FINDINGS Brain: No significant change in moderate to marked diffuse enlargement of the ventricles and subarachnoid spaces. Mild-to-moderate patchy white matter low density in both cerebral hemispheres without significant change. No intracranial hemorrhage, mass lesion or CT evidence of acute infarction. Vascular: No hyperdense vessel or unexpected calcification. Atheromatous arterial calcifications at the skull base. Skull: Normal. Negative for fracture or focal lesion. Sinuses/Orbits: Status post right cataract extraction. Bilateral anterior ethmoid sinus mucosal thickening. Mild left maxillary sinus mucosal thickening. Other: None. CT CERVICAL SPINE FINDINGS Alignment: Stable reversal of the normal cervical lordosis. No subluxations. Skull base and vertebrae: No acute fracture. No primary bone lesion or focal pathologic process. Soft tissues and spinal canal: No prevertebral fluid or swelling. No visible canal hematoma. Disc levels:  Stable multilevel  degenerative changes. Upper chest: Clear lung apices. Other: Bilateral carotid artery calcifications. IMPRESSION: 1. No skull fracture or intracranial hemorrhage. 2. No cervical spine fracture or subluxation. 3. Stable diffuse cerebral and cerebellar atrophy in chronic small vessel white matter ischemic changes. 4. Stable reversal of the normal cervical lordosis and multilevel cervical spine degenerative changes. 5. Bilateral carotid artery atheromatous calcifications. Electronically Signed   By: Claudie Revering M.D.   On: 06/07/2021 16:38   MR FOOT LEFT WO CONTRAST  Result Date: 06/08/2021 CLINICAL DATA:  Osteomyelitis, foot EXAM: MRI OF THE LEFT FOOT WITHOUT CONTRAST TECHNIQUE: Multiplanar, multisequence MR imaging of the left forefoot was performed. No intravenous contrast was administered. COMPARISON:  Left foot radiograph 06/07/2021 FINDINGS: Bones/Joint/Cartilage Prior great toe amputation. There is bony edema and confluent low T1 signal within the second toe proximal phalanx and mid to distal second metatarsal. The second toe proximal phalanx is dorsally dislocated from the second metatarsal head. There is edema signal within the first and third metatarsal heads with preserved T1 signal. Similar findings but to a lesser degree in the fourth metatarsal head. There is  a subacute fracture of the third metatarsal head/neck with mild angulation. Ligaments Surgically disrupted first MTP ligaments. Torn second MTP plantar plate and collateral ligaments. The third through fifth MTP collateral ligaments appear intact without evidence of plantar plate tear. The Lisfranc ligament appears intact. Muscles and Tendons Diffuse intramuscular edema and muscle atrophy in the foot as is commonly seen in diabetics. Soft tissues Diffuse soft tissue swelling of the foot. There is some localized fluid in the first webspace between the first and second metatarsal heads, not well-defined. IMPRESSION: Osteomyelitis of the second toe  proximal phalanx and mid to distal second metatarsal. Dorsal dislocation of the second MTP joint. Bony edema in the adjacent first and third metatarsal heads which is favored to be reactive, though early osteomyelitis could have a similar appearance. Subacute third metatarsal head/neck fracture with mild angulation, accounting for some of the aforementioned bony edema. Localized fluid in the first webspace between the first and second metatarsal heads, not well-defined but could represent developing phlegmon/abscess. Reactive mild edema in the fourth metatarsal head. Electronically Signed   By: Maurine Simmering M.D.   On: 06/08/2021 09:28   DG Chest Portable 1 View  Result Date: 06/07/2021 CLINICAL DATA:  Concern for pneumonia EXAM: PORTABLE CHEST 1 VIEW COMPARISON:  Chest x-ray 06/03/2021 FINDINGS: Cardiomegaly and mediastinum are stable. Pulmonary vasculature appears within normal limits. No focal consolidation identified. Prominent chronic interstitial lung markings similar to previous. No significant pleural effusion visualized. No pneumothorax. IMPRESSION: Cardiomegaly with no acute process identified. Electronically Signed   By: Ofilia Neas M.D.   On: 06/07/2021 16:12   DG Foot Complete Left  Result Date: 06/07/2021 CLINICAL DATA:  Left great toe amputation, EXAM: LEFT FOOT - COMPLETE 3+ VIEW COMPARISON:  05/13/2021 FINDINGS: Frontal, oblique, lateral views of the left foot are obtained. Prior amputation at the level of the first metatarsophalangeal joint. There is diffuse soft tissue swelling throughout the forefoot and remaining digits, as well as at the surgical site. On the lateral view, there is evidence of bony destruction at the base of the second proximal phalanx concerning for osteomyelitis. No other acute or destructive bony lesions. Stable osteoarthritis. Stable postsurgical changes of the left ankle. IMPRESSION: 1. Interval amputation at the first metatarsophalangeal joint. 2. Bony  destruction at the base of the second proximal phalanx, best seen on lateral view, concerning for osteomyelitis. 3. Progressive soft tissue swelling throughout the left forefoot and remaining digits. Electronically Signed   By: Randa Ngo M.D.   On: 06/07/2021 19:20    DISCHARGE EXAMINATION: Vitals:   06/10/21 0250 06/10/21 0413 06/10/21 0800 06/10/21 1145  BP:  (!) 115/48 102/80 131/82  Pulse:  (!) 57 60 62  Resp:  17 17 20   Temp:  98 F (36.7 C) 97.7 F (36.5 C) 97.8 F (36.6 C)  TempSrc:  Oral Oral Oral  SpO2: 95% 95% 96% 100%  Weight:      Height:       General appearance: Awake alert.  In no distress Resp: Clear to auscultation bilaterally.  Normal effort Cardio: S1-S2 is normal regular.  No S3-S4.  No rubs murmurs or bruit GI: Abdomen is soft.  Nontender nondistended.  Bowel sounds are present normal.  No masses organomegaly Improvement in erythema over the left foot is noted.   DISPOSITION: Independent living facility  Discharge Instructions     Call MD for:  difficulty breathing, headache or visual disturbances   Complete by: As directed    Call MD for:  extreme fatigue   Complete by: As directed    Call MD for:  persistant dizziness or light-headedness   Complete by: As directed    Call MD for:  persistant nausea and vomiting   Complete by: As directed    Call MD for:  severe uncontrolled pain   Complete by: As directed    Call MD for:  temperature >100.4   Complete by: As directed    Diet - low sodium heart healthy   Complete by: As directed    Discharge instructions   Complete by: As directed    Please take your medications as prescribed.  Please keep your appointments for hemodialysis as per your usual schedule.  Seek attention if the redness in your left foot worsens.  You will need to follow-up with Dr. Jess Barters office.  You were cared for by a hospitalist during your hospital stay. If you have any questions about your discharge medications or the care  you received while you were in the hospital after you are discharged, you can call the unit and asked to speak with the hospitalist on call if the hospitalist that took care of you is not available. Once you are discharged, your primary care physician will handle any further medical issues. Please note that NO REFILLS for any discharge medications will be authorized once you are discharged, as it is imperative that you return to your primary care physician (or establish a relationship with a primary care physician if you do not have one) for your aftercare needs so that they can reassess your need for medications and monitor your lab values. If you do not have a primary care physician, you can call [phone] for a physician referral.   Increase activity slowly   Complete by: As directed    No wound care   Complete by: As directed           Allergies as of 06/10/2021       Reactions   Penicillins Rash        Medication List     STOP taking these medications    doxycycline 100 MG tablet Commonly known as: VIBRA-TABS   ketoconazole 2 % cream Commonly known as: NIZORAL   molnupiravir EUA 200 mg Caps capsule Commonly known as: LAGEVRIO   nystatin powder Commonly known as: MYCOSTATIN/NYSTOP   potassium chloride SA 20 MEQ tablet Commonly known as: KLOR-CON M       TAKE these medications    acetaminophen 325 MG tablet Commonly known as: TYLENOL Take 2 tablets (650 mg total) by mouth every 4 (four) hours as needed for headache or mild pain. What changed:  how much to take reasons to take this   amiodarone 200 MG tablet Commonly known as: PACERONE TAKE 1 TABLET BY MOUTH ONCE DAILY What changed: when to take this   atorvastatin 20 MG tablet Commonly known as: LIPITOR Take 1 tablet (20 mg total) by mouth daily. What changed: when to take this   carvedilol 6.25 MG tablet Commonly known as: COREG Take 1 tablet (6.25 mg total) by mouth 2 (two) times daily.   ceFEPIme 2  g in sodium chloride 0.9 % 100 mL Inject 2 g into the vein every Monday, Wednesday, and Friday at 6 PM. Start taking on: June 11, 2021   Centrum Silver 50+Men Tabs Take 1 tablet by mouth every other day.   cetirizine 10 MG tablet Commonly known as: ZYRTEC Take 10 mg by mouth daily.   escitalopram  10 MG tablet Commonly known as: LEXAPRO Take 1 tablet (10 mg total) by mouth daily. What changed: when to take this   ferrous sulfate 324 MG Tbec Take 324 mg by mouth every morning.   FOLIC ACID PO Take 1 tablet by mouth every morning.   HYDROcodone-acetaminophen 5-325 MG tablet Commonly known as: NORCO/VICODIN Take 1 tablet by mouth every 4 (four) hours as needed. What changed: reasons to take this   Magnesium Oxide 400 MG Caps Take 1 capsule (400 mg total) by mouth daily.   metroNIDAZOLE 500 MG tablet Commonly known as: Flagyl Take 1 tablet (500 mg total) by mouth 2 (two) times daily.   nitroGLYCERIN 0.4 MG SL tablet Commonly known as: NITROSTAT Place 1 tablet (0.4 mg total) under the tongue every 5 (five) minutes x 3 doses as needed for chest pain.   pantoprazole 40 MG tablet Commonly known as: PROTONIX Take 1 tablet (40 mg total) by mouth daily. What changed: when to take this   sodium bicarbonate 650 MG tablet Take 1 tablet (650 mg total) by mouth 2 (two) times daily.   tamsulosin 0.4 MG Caps capsule Commonly known as: FLOMAX Take 0.4 mg by mouth at bedtime as needed (frequent urination).   torsemide 20 MG tablet Commonly known as: DEMADEX Take 60 mg by mouth See admin instructions. Take 3 tablets (60 mg) by mouth every morning, may also take 3 tablets (60 mg) in the afternoon as needed for swelling What changed: Another medication with the same name was removed. Continue taking this medication, and follow the directions you see here.   traZODone 50 MG tablet Commonly known as: DESYREL Take 1 tablet (50 mg total) by mouth at bedtime. What changed:  when to  take this reasons to take this   vancomycin 750 MG/150ML Soln Commonly known as: VANCOREADY Inject 150 mLs (750 mg total) into the vein every Monday, Wednesday, and Friday with hemodialysis.   vitamin B-12 500 MCG tablet Commonly known as: CYANOCOBALAMIN Take 1 tablet (500 mcg total) by mouth daily. Start taking on: June 11, 2021          Follow-up Information     Tisovec, [name], MD .   Specialty: Internal Medicine Contact information: [place] [address] [place] Follow up.   Why: Resumption of your home health services will be continued with [facility], start of care with 48 hours post discharge Contact information: [phone]        Newt Minion, MD Follow up in 1 week(s).   Specialty: Orthopedic Surgery Contact information: [place] [address] [phone]                 TOTAL DISCHARGE TIME: 46 minutes  [facility] Pager on [email or web address]  06/10/2021, 12:18 PM

## 2021-06-10 NOTE — Care Management Important Message (Signed)
Important Message  Patient Details  Name: Darryl Diaz MRN: [id number] Date of Birth: 07/14/1940   Medicare Important Message Given:  Yes     Hannah Beat 06/10/2021, 1:05 PM

## 2021-06-10 NOTE — NC FL2 (Signed)
[place] LEVEL OF CARE SCREENING TOOL     IDENTIFICATION  Patient Name: Darryl Diaz Birthdate: [birth year] Sex: male Admission Date (Current Location): 06/07/2021  [facility] and [place] Number:  Herbalist and Address:         Provider Number: [id number]  Attending Physician Name and Address:  Bonnielee Haff, MD  Relative Name and Phone Number:       Current Level of Care: Hospital Recommended Level of Care: [place] Prior Approval Number:    Date Approved/Denied:   PASRR Number: [id number]  Discharge Plan: SNF    Current Diagnoses: Patient Active Problem List   Diagnosis Date Noted   Frequent falls 06/07/2021   Generalized weakness 06/07/2021   Incidental finding of COVID-19 virus infection 06/07/2021   Dependence on renal dialysis ([place]) 05/25/2021   Non-pressure chronic ulcer of other part of left foot with unspecified severity ([place]) 05/25/2021   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[place]) 05/25/2021   Osteomyelitis of great toe of left foot ([place])    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   Cellulitis of right foot 01/17/2021   Normocytic anemia 01/17/2021   Acute lower UTI 01/17/2021   Acute GI bleeding 01/06/2021   Symptomatic anemia 12/02/2020   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   Orthostatic hypotension 11/23/2020   Acute renal insufficiency 10/23/2020   Alcohol abuse 10/23/2020   Hypokalemia 10/23/2020   Hypomagnesemia 10/23/2020   Low back pain at multiple sites 10/23/2020   Malnutrition of mild degree Altamease Oiler: 75% to less than 90% of standard weight) ([place]) 10/23/2020   Open wound of scalp without complication 81/44/8185   Oral candidiasis 10/23/2020   Cellulitis of left foot    Cellulitis of toe of left foot 10/13/2020   Skin ulcer of left great toe ([place]) 10/13/2020   CKD (chronic kidney disease) stage 4, GFR 15-29 ml/min ([place]) 10/13/2020   Chronic heart failure with  preserved ejection fraction (HFpEF) ([place]) 10/13/2020   Transient weakness of left lower extremity 09/07/2020   Heme positive stool 05/14/2020   Abnormality of gait 04/27/2020   Salmonella food poisoning 63/14/9702   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[place]) 03/17/2020   Yeast infection 03/16/2020   Diarrhea 03/16/2020   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 ([place])    Allergies    Anemia, chronic disease    History of hypertension    Debility    Syncope and collapse 02/22/2020   AKI (acute kidney injury) ([place]) 02/10/2020   Unspecified protein-calorie malnutrition ([place]) 02/10/2020   Weakness 02/06/2020   History of repair of hip joint 10/15/2019   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   Pain in right foot 06/27/2019   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   Bradycardia 04/20/2019   Gastrointestinal hemorrhage 02/25/2019   Fall at home, initial encounter 02/25/2019   Bilateral lower extremity edema 02/25/2019   Hyponatremia 02/25/2019   Fall 02/25/2019   Benign prostatic hyperplasia with lower urinary tract symptoms 12/19/2018   Iron deficiency anemia due to chronic blood loss 08/11/2018   Anemia 08/11/2018   Hardening of the aorta (main artery of the heart) ([place]) 08/11/2018   Benign neoplasm of colon 06/18/2018   History of adenomatous polyp of colon 08/10/2017   AF (paroxysmal atrial fibrillation) ([place]) 02/05/2017   Impaired fasting glucose 06/10/2016   Encounter for general adult medical examination without abnormal findings 06/03/2016   Posterior dislocation of hip, closed ([place]) 04/30/2016   Hip fracture ([place]) 03/30/2016  Hypertension 03/30/2016   Hyperlipidemia 03/30/2016   Osteoarthritis 03/30/2016   Depressive disorder 03/30/2016   Rhabdomyolysis 03/30/2016   Leukocytosis 03/30/2016   Pressure ulcer 03/30/2016   Fracture of hip ([place]) 03/30/2016   Gastroesophageal reflux disease 12/17/2015   HTN (hypertension) 12/17/2015   Long term (current)  use of anticoagulants 12/17/2015   Moderate major depression, single episode ([place]) 12/17/2015   Pure hypercholesterolemia 12/17/2015   Seasonal allergic rhinitis 12/17/2015   CAD (coronary artery disease) 08/21/2015    Orientation RESPIRATION BLADDER Height & Weight     Self, Time, Situation, Place  Normal Continent Weight: 89.5 kg Height:  6' (182.9 cm)  BEHAVIORAL SYMPTOMS/MOOD NEUROLOGICAL BOWEL NUTRITION STATUS      Continent Diet (refer to d/c summary)  AMBULATORY STATUS COMMUNICATION OF NEEDS Skin   Extensive Assist Verbally Normal                       Personal Care Assistance Level of Assistance  Bathing, Feeding, Dressing Bathing Assistance: Limited assistance Feeding assistance: Independent Dressing Assistance: Limited assistance     Functional Limitations Info  Sight, Hearing, Speech Sight Info: Adequate Hearing Info: Impaired (hoh) Speech Info: Adequate    SPECIAL CARE FACTORS FREQUENCY  PT (By licensed PT), OT (By licensed OT)     PT Frequency: 5x/week, evaluate and treat OT Frequency: 5x/week, evaluate and treat            Contractures Contractures Info: Not present    Additional Factors Info  Code Status Code Status Info: DNR             Current Medications (06/10/2021):  This is the current hospital active medication list Current Facility-Administered Medications  Medication Dose Route Frequency Provider Last Rate Last Admin   acetaminophen (TYLENOL) tablet 650 mg  650 mg Oral Q6H PRN Howerter, Justin B, DO       Or   acetaminophen (TYLENOL) suppository 650 mg  650 mg Rectal Q6H PRN Howerter, Justin B, DO       amiodarone (PACERONE) tablet 200 mg  200 mg Oral Daily Howerter, Justin B, DO   200 mg at 06/10/21 1610   atorvastatin (LIPITOR) tablet 20 mg  20 mg Oral Daily Howerter, Justin B, DO   20 mg at 06/10/21 0813   carvedilol (COREG) tablet 6.25 mg  6.25 mg Oral BID Howerter, Justin B, DO   6.25 mg at 06/10/21 9604   ceFEPIme  (MAXIPIME) 1 g in sodium chloride 0.9 % 100 mL IVPB  1 g Intravenous Q24H Irene Pap N, DO 200 mL/hr at 06/10/21 0034 1 g at 06/10/21 0034   Chlorhexidine Gluconate Cloth 2 % PADS 6 each  6 each Topical Q0600 Janalee Dane, PA-C   6 each at 06/10/21 5409   Chlorhexidine Gluconate Cloth 2 % PADS 6 each  6 each Topical Q0600 Janalee Dane, PA-C   6 each at 06/10/21 0929   ipratropium-albuterol (DUONEB) 0.5-2.5 (3) MG/3ML nebulizer solution 3 mL  3 mL Nebulization Q6H Hall, Carole N, DO   3 mL at 06/10/21 0846   loratadine (CLARITIN) tablet 10 mg  10 mg Oral Daily Howerter, Justin B, DO   10 mg at 06/10/21 8119   metroNIDAZOLE (FLAGYL) IVPB 500 mg  500 mg Intravenous Q12H Irene Pap N, DO 100 mL/hr at 06/10/21 0543 500 mg at 06/10/21 0543   pantoprazole (PROTONIX) EC tablet 40 mg  40 mg Oral Daily Howerter, Justin B, DO   40  mg at 06/10/21 0813   sodium bicarbonate tablet 650 mg  650 mg Oral BID Howerter, Justin B, DO   650 mg at 06/10/21 0813   vancomycin (VANCOREADY) IVPB 750 mg/150 mL  750 mg Intravenous Q M,W,F-HD Pham, Minh Q, RPH-CPP       vitamin B-12 (CYANOCOBALAMIN) tablet 500 mcg  500 mcg Oral Daily Irene Pap N, DO   500 mcg at 06/10/21 3174   Facility-Administered Medications Ordered in Other Encounters  Medication Dose Route Frequency Provider Last Rate Last Admin   metoprolol tartrate (LOPRESSOR) injection    Anesthesia Intra-op Mariea Clonts, CRNA   5 mg at 02/27/19 1318     Discharge Medications: Please see discharge summary for a list of discharge medications.  Relevant Imaging Results:  Relevant Lab Results:   Additional Information [id number] Covid vaccinated x3, Covid positive  Sharin Mons, RN

## 2021-06-10 NOTE — TOC Transition Note (Incomplete)
Transition of Care [facility]) - CM/SW Discharge Note   Patient Details  Name: Darryl Diaz MRN: [id number] Date of Birth: 04/22/1941  [facility] CM/SW Contact:  Sharin Mons, RN Phone Number: 06/10/2021, 10:08 AM   Clinical Narrative:    Patient will DC to: Home Anticipated DC date: 06/10/2021 Family notified: yes Transport by: car   Per MD patient ready for DC today after dialysis . RN, patient, patient's sister, and [facility] notified of DC. Discharge Summary and FL2 sent to facility. RN to call report prior to discharge (). DC packet on chart. Ambulance transport requested for patient.   RNCM will sign off for now as intervention is no longer needed. Please consult [place] again if new needs arise.   Final next level of care: Home w Home Health Services Barriers to Discharge: No Barriers Identified   Patient Goals and CMS Choice        Discharge Placement                       Discharge Plan and Services   Discharge Planning Services: CM Consult                      HH Arranged: PT, OT, Nurse's Aide, Social Work, Therapist, sports [place] Agency: Other - See comment ([place])        Social Determinants of Health (SDOH) Interventions     Readmission Risk Interventions Readmission Risk Prevention Plan 01/18/2021 03/18/2020 03/05/2020  Transportation Screening Complete Complete Complete  PCP or Specialist Appt within 5-7 Days - - -  Home Care Screening - - -  Medication Review (RN CM) - - -  [place] or [place] - Complete Complete  Social Work Consult for [place] Planning/Counseling - Complete Complete  Palliative Care Screening - Not Applicable Not Applicable  Medication Review Press photographer) Complete Complete Complete  PCP or Specialist appointment within 3-5 days of discharge Complete - -  [place] or Home Care Consult Complete - -  SW Recovery Care/Counseling Consult Complete - -  Palliative Care Screening Not Applicable - -   Skilled Nursing Facility Complete - -  Some recent data might be hidden

## 2021-06-10 NOTE — TOC Progression Note (Addendum)
°  Transition of Care [facility]) - Progression Note    Patient Details  Name: Darryl Diaz MRN: [id number] Date of Birth: 31-Aug-1940  [facility]) CM/SW Contact  Sharin Mons, RN Phone Number: 06/10/2021, 11:00 AM  Clinical Narrative:    Pt from home alone  [facility]), requiring supervision and assistance. Agreeable to SNF placement ,care side @ [place]. [facility] has greed to accept pt today. Pt will d/c to SNF after HD today. Per [facility] admission liaison pt will go int rm# [phone]. Report can be called by nurse to [phone].  TOC team will continue to monitor and assist with needs....  06/10/2021  11:20 am NCM made aware from [facility] admissions' liaison they can't accept pt today 2/2 to COVID status. States they can receive him after 10 day quarantine which will be 2/4, Saturday. The earliest they can receive will be on Monday. States they dont admit over the weekend, MD nurse and pt made aware.   Barriers to Discharge: No Barriers Identified  Expected Discharge Plan and Services     Discharge Planning Services: CM Consult     Expected Discharge Date: 06/10/21                         HH Arranged: PT, OT, Nurse's Aide, Social Work, Engineer, materials Agency: Other - See comment ([place])         Social Determinants of Health (SDOH) Interventions    Readmission Risk Interventions Readmission Risk Prevention Plan 01/18/2021 03/18/2020 03/05/2020  Transportation Screening Complete Complete Complete  PCP or Specialist Appt within 5-7 Days - - -  Home Care Screening - - -  Medication Review (RN CM) - - -  [place] or [place] - Complete Complete  Social Work Consult for [place] Planning/Counseling - Complete Complete  Palliative Care Screening - Not Applicable Not Applicable  Medication Review Press photographer) Complete Complete Complete  PCP or Specialist appointment within 3-5 days of discharge Complete - -  [place] or Home Care  Consult Complete - -  SW Recovery Care/Counseling Consult Complete - -  Palliative Care Screening Not Applicable - -  Skilled Nursing Facility Complete - -  Some recent data might be hidden

## 2021-06-10 NOTE — Progress Notes (Signed)
Patient ID: Darryl Diaz, male   DOB: Oct 18, 1940, 81 y.o.   MRN: [id number] Patient is seen in follow-up for cellulitis left foot.  The cellulitis is resolving nicely and the incision is healing with minimal drainage at this time.  I feel patient should resolve the infection with antibiotics and would plan for discharge on oral antibiotics for several weeks.

## 2021-06-11 DIAGNOSIS — M86272 Subacute osteomyelitis, left ankle and foot: Secondary | ICD-10-CM | POA: Diagnosis not present

## 2021-06-11 DIAGNOSIS — I480 Paroxysmal atrial fibrillation: Secondary | ICD-10-CM | POA: Diagnosis not present

## 2021-06-11 DIAGNOSIS — L03116 Cellulitis of left lower limb: Secondary | ICD-10-CM | POA: Diagnosis not present

## 2021-06-11 DIAGNOSIS — I10 Essential (primary) hypertension: Secondary | ICD-10-CM | POA: Diagnosis not present

## 2021-06-11 LAB — CBC
HCT: 28.9 % — ABNORMAL LOW (ref 39.0–52.0)
Hemoglobin: 9.5 g/dL — ABNORMAL LOW (ref 13.0–17.0)
MCH: 31.8 pg (ref 26.0–34.0)
MCHC: 32.9 g/dL (ref 30.0–36.0)
MCV: 96.7 fL (ref 80.0–100.0)
Platelets: 145 10*3/uL — ABNORMAL LOW (ref 150–400)
RBC: 2.99 MIL/uL — ABNORMAL LOW (ref 4.22–5.81)
RDW: 17.2 % — ABNORMAL HIGH (ref 11.5–15.5)
WBC: 4.7 10*3/uL (ref 4.0–10.5)
nRBC: 0 % (ref 0.0–0.2)

## 2021-06-11 LAB — RENAL FUNCTION PANEL
Albumin: 2.2 g/dL — ABNORMAL LOW (ref 3.5–5.0)
Anion gap: 10 (ref 5–15)
BUN: 30 mg/dL — ABNORMAL HIGH (ref 8–23)
CO2: 24 mmol/L (ref 22–32)
Calcium: 8.2 mg/dL — ABNORMAL LOW (ref 8.9–10.3)
Chloride: 102 mmol/L (ref 98–111)
Creatinine, Ser: 4.54 mg/dL — ABNORMAL HIGH (ref 0.61–1.24)
GFR, Estimated: 12 mL/min — ABNORMAL LOW (ref >60)
Glucose, Bld: 89 mg/dL (ref 70–99)
Phosphorus: 3.5 mg/dL (ref 2.5–4.6)
Potassium: 3.5 mmol/L (ref 3.5–5.1)
Sodium: 136 mmol/L (ref 135–145)

## 2021-06-11 LAB — METHYLMALONIC ACID, SERUM: Methylmalonic Acid, Quantitative: 360 nmol/L (ref 0–378)

## 2021-06-11 MED ORDER — SODIUM CHLORIDE 0.9 % IV SOLN
2.0000 g | INTRAVENOUS | Status: DC
  Administered 2021-06-11: 2 g via INTRAVENOUS
  Filled 2021-06-11: qty 2

## 2021-06-11 MED ORDER — PROSOURCE PLUS PO LIQD
30.0000 mL | Freq: Two times a day (BID) | ORAL | Status: DC
  Administered 2021-06-11 – 2021-06-14 (×7): 30 mL via ORAL
  Filled 2021-06-11 (×7): qty 30

## 2021-06-11 MED ORDER — HEPARIN SODIUM (PORCINE) 5000 UNIT/ML IJ SOLN
5000.0000 [IU] | Freq: Three times a day (TID) | INTRAMUSCULAR | Status: DC
  Administered 2021-06-11 – 2021-06-13 (×6): 5000 [IU] via SUBCUTANEOUS
  Filled 2021-06-11 (×7): qty 1

## 2021-06-11 MED ORDER — HEPARIN SODIUM (PORCINE) 1000 UNIT/ML IJ SOLN
INTRAMUSCULAR | Status: AC
  Filled 2021-06-11: qty 3

## 2021-06-11 NOTE — Progress Notes (Signed)
OT Cancellation Note  Patient Details Name: Darryl Diaz MRN: [id number] DOB: 21-Sep-1940   Cancelled Treatment:    Reason Eval/Treat Not Completed: Patient at procedure or test/ unavailable (HD)  Malka So 06/11/2021, 12:58 PM Nestor Lewandowsky, OTR/L Acute Rehabilitation Services Pager: [phone] Office: [phone]

## 2021-06-11 NOTE — Assessment & Plan Note (Addendum)
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 Follows with Dr. Sharol Given outpatient. Patient underwent MRI.  Was seen by Dr. Sharol Given.  Also seen by infectious disease due to concern for osteomyelitis.  No surgical intervention is planned.  Blood cultures have been negative. Patient was placed on vancomycin cefepime and metronidazole. Cellulitis has improved. Plan is to treat with vancomycin cefepime and oral metronidazole for 6 weeks.  He will get the vancomycin and cefepime with his hemodialysis 3 days a week.  Patient seen by physical therapy.    Patient to return to his facility but will need to go to the skilled nursing level of care rather than independent level.

## 2021-06-11 NOTE — Assessment & Plan Note (Signed)
See under cellulitis.

## 2021-06-11 NOTE — Assessment & Plan Note (Addendum)
He is on a Monday Wednesday Friday schedule.

## 2021-06-11 NOTE — Progress Notes (Signed)
[facility] PROGRESS NOTE   Darryl Diaz FGH:[id number] DOB: 04/30/1941 DOA: 06/07/2021  4 DOS: the patient was seen and examined on 06/11/2021  PCP: [name], MD  Brief History and Hospital Course:  Darryl Diaz is a 81 y.o. 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  Covid-19 screening test positive on 06/03/21, started on antiviral Molnupiravir on admission.  Was placed on broad-spectrum antibiotics for cellulitis and left foot osteomyelitis.  Seen by orthopedic surgery.  No need for surgical intervention at this time.  Medically stable.  Cannot return to this facility till 06/14/2021 due to [place].    Consultants: Orthopedics.  Infectious disease.  Nephrology  Procedures: None except for hemodialysis    Subjective: Patient mentions that he feels well.  Denies any pain in his left foot.  No new complaints.  No shortness of breath.    Assessment/Plan:   * 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[place])- (present on admission) See under cellulitis.  Cellulitis of left foot- (present on admission) Second toe proximal phalanx and mid to distal second metatarsal osteomyelitis seen on left foot MRI  He is s/p left great toe amputation during previous hospitalization. Follows with Dr. Sharol Given outpatient. Patient underwent MRI.  Was seen by Dr. Sharol Given.  Also seen by infectious disease due to concern for osteomyelitis.  No surgical intervention is planned.  Blood cultures have been negative. Patient was placed on vancomycin cefepime and metronidazole. Cellulitis has improved.  Patient feels  better. Plan is to treat with vancomycin cefepime and oral metronidazole for 6 weeks.  He will get the vancomycin and cefepime with his hemodialysis 3 days a week.  Patient seen by physical therapy.    Patient to return to his facility but will need to go to the skilled nursing level of care rather than independent level.  Incidental finding of COVID-19 virus infection- (present on admission) Incidentally positive on 06/03/2021. Treated with Molnupiravir.  Asymptomatic for the most part.  Saturating normal on room air. He will complete his 10 days of isolation on 2/5.  He can come off of isolation on 2/6.  ESRD on dialysis [facility]) Nephrology is following.  He is on a Monday Wednesday Friday schedule.  To be dialyzed today.  AF (paroxysmal atrial fibrillation) ([place])- (present on admission) Patient not on anticoagulation due to history of GI bleed.  Plus has had frequent falls.  He is noted to be on carvedilol and amiodarone which is being continued.  Anemia, chronic disease- (present on admission) Hemoglobin low but stable.  No evidence for any overt blood loss.  HTN (hypertension)- (present on admission) Blood pressure stable for the most part.  Occasional high readings noted.  He is on carvedilol alone.  Continue to monitor for now.  Frequent falls Seen by PT and OT.  Skilled nursing facility.     DVT Prophylaxis: We will initiate subcutaneous heparin since he will be here for a few more days Code Status: DNR Family Communication: Discussed with patient.  No family at bedside Disposition Plan: SNF on 2/6  Status is: Inpatient  Remains inpatient appropriate because: COVID-19, cellulitis of left foot  Medications: Scheduled:  (feeding supplement) PROSource Plus  30 mL Oral BID BM   amiodarone  200 mg Oral Daily   atorvastatin  20 mg Oral Daily   carvedilol  6.25 mg Oral BID   Chlorhexidine Gluconate Cloth  6 each Topical Q0600   Chlorhexidine Gluconate Cloth  6 each  Topical Q0600   heparin sodium (porcine)       ipratropium-albuterol  3 mL Nebulization BID   loratadine  10 mg Oral Daily   pantoprazole  40 mg Oral Daily   vitamin B-12  500 mcg Oral Daily   Continuous:  ceFEPime (MAXIPIME) IV Stopped (06/11/21 0136)   metronidazole 500 mg (06/11/21 0616)   vancomycin     HAL:[id number] **OR** acetaminophen, albuterol  Antibiotics: Anti-infectives (From admission, onward)    Start     Dose/Rate Route Frequency Ordered Stop   06/11/21 0000  ceFEPIme 1 g in sodium chloride 0.9 % 100 mL  Status:  Discontinued        1 g Intravenous Every M-W-F (1800) 06/10/21 0849 06/10/21    06/11/21 0000  ceFEPIme 2 g in sodium chloride 0.9 % 100 mL        2 g Intravenous Every M-W-F (1800) 06/10/21 1217 07/23/21 2359   06/10/21 0115  vancomycin (VANCOREADY) IVPB 750 mg/150 mL        750 mg 150 mL/hr over 60 Minutes Intravenous  Once 06/10/21 0016 06/10/21 0243   06/10/21 0115  ceFEPIme (MAXIPIME) 1 g in sodium chloride 0.9 % 100 mL IVPB        1 g 200 mL/hr over 30 Minutes Intravenous Every 24 hours 06/10/21 0016     06/10/21 0000  metroNIDAZOLE (FLAGYL) 500 MG tablet        500 mg Oral 2 times daily 06/10/21 0849 07/22/21 2359   06/10/21 0000  vancomycin (VANCOREADY) 750 MG/150ML SOLN        750 mg Intravenous Every M-W-F (Hemodialysis) 06/10/21 0849 07/22/21 2359   06/09/21 1800  ceFEPIme (MAXIPIME) 2 g in sodium chloride 0.9 % 100 mL IVPB  Status:  Discontinued        2 g 200 mL/hr over 30 Minutes Intravenous Every M-W-F (1800) 06/09/21 1028 06/10/21 0016   06/09/21 1200  vancomycin (VANCOCIN) IVPB 1000 mg/200 mL premix  Status:  Discontinued        1,000 mg 200 mL/hr over 60 Minutes Intravenous Every M-W-F (Hemodialysis) 06/08/21 0800 06/09/21 1019   06/09/21 1200  vancomycin (VANCOREADY) IVPB 750 mg/150 mL        750 mg 150 mL/hr over 60 Minutes Intravenous Every M-W-F (Hemodialysis) 06/09/21 1019     06/08/21 1800  ceFEPIme (MAXIPIME) 1 g in sodium  chloride 0.9 % 100 mL IVPB  Status:  Discontinued        1 g 200 mL/hr over 30 Minutes Intravenous Every 24 hours 06/08/21 1611 06/09/21 1028   06/08/21 1715  vancomycin (VANCOREADY) IVPB 750 mg/150 mL        750 mg 150 mL/hr over 60 Minutes Intravenous  Once 06/08/21 1622 06/08/21 1758   06/08/21 1645  metroNIDAZOLE (FLAGYL) IVPB 500 mg        500 mg 100 mL/hr over 60 Minutes Intravenous Every 12 hours 06/08/21 1546     06/08/21 1608  vancomycin variable dose per unstable renal function (pharmacist dosing)  Status:  Discontinued         Does not apply See admin instructions 06/08/21 1608 06/08/21 1622  06/08/21 0600  piperacillin-tazobactam (ZOSYN) IVPB 2.25 g  Status:  Discontinued        2.25 g 100 mL/hr over 30 Minutes Intravenous Every 8 hours 06/07/21 2111 06/07/21 2151   06/07/21 2330  molnupiravir EUA (LAGEVRIO) capsule 800 mg        4 capsule Oral 2 times daily 06/07/21 2156 06/10/21 0022   06/07/21 2200  cefTRIAXone (ROCEPHIN) 2 g in sodium chloride 0.9 % 100 mL IVPB  Status:  Discontinued        2 g 200 mL/hr over 30 Minutes Intravenous Every 24 hours 06/07/21 2151 06/08/21 1548   06/07/21 2109  vancomycin variable dose per unstable renal function (pharmacist dosing)  Status:  Discontinued         Does not apply See admin instructions 06/07/21 2109 06/08/21 0800   06/07/21 2015  vancomycin (VANCOREADY) IVPB 1750 mg/350 mL        1,750 mg 175 mL/hr over 120 Minutes Intravenous  Once 06/07/21 2002 06/08/21 0127   06/07/21 1945  piperacillin-tazobactam (ZOSYN) IVPB 3.375 g  Status:  Discontinued        3.375 g 100 mL/hr over 30 Minutes Intravenous  Once 06/07/21 1937 06/07/21 2151       Objective:  Vital Signs  Vitals:   06/11/21 0830 06/11/21 0900 06/11/21 0930 06/11/21 1000  BP: (!) 153/65 139/66 (!) 139/38 (!) 148/67  Pulse:      Resp:  18 19 20   Temp:      TempSrc:      SpO2: 97%     Weight:      Height:        Intake/Output Summary (Last 24 hours) at  06/11/2021 1014 Last data filed at 06/11/2021 0900 Gross per 24 hour  Intake 355 ml  Output 3 ml  Net 352 ml   Filed Weights   06/09/21 0659 06/11/21 0014 06/11/21 0815  Weight: 89.5 kg 89.3 kg 89.3 kg    General appearance: Awake alert.  In no distress Resp: Clear to auscultation bilaterally.  Normal effort Cardio: S1-S2 is normal regular.  No S3-S4.  No rubs murmurs or bruit GI: Abdomen is soft.  Nontender nondistended.  Bowel sounds are present normal.  No masses organomegaly Extremities: Swelling and erythema of the left foot noted.  Better per patient.  Some limitation of range of motion noted.  No drainage noted from the amputation site of the great toe Neurologic: No focal neurological deficits.    Lab Results:  Data Reviewed: I have personally reviewed labs and imaging study reports  CBC: Recent Labs  Lab 06/05/21 0741 06/07/21 1530 06/08/21 0601 06/09/21 2204 06/11/21 0657  WBC 4.2 5.0 5.6 4.2 4.7  NEUTROABS 2.7 3.3 4.4  --   --   HGB 9.4* 9.3* 10.1* 9.4* 9.5*  HCT 28.8* 28.0* 31.4* 29.5* 28.9*  MCV 97.0 95.2 96.3 96.1 96.7  PLT 169 182 176 157 145*    Basic Metabolic Panel: Recent Labs  Lab 06/05/21 0741 06/07/21 1530 06/08/21 0253 06/09/21 2204 06/11/21 0657  NA 135 130* 133* 135 136  K 4.2 4.7 4.7 3.5 3.5  CL 96* 93* 92* 100 102  CO2 28 24 24 23 24   GLUCOSE 84 75 47* 125* 89  BUN 8 20 23 23  30*  CREATININE 2.74* 4.25* 4.41* 4.23* 4.54*  CALCIUM 8.8* 8.5* 8.6* 8.2* 8.2*  MG  --  1.9 2.0  --   --   PHOS  --   --  3.3  3.0 3.5    GFR: Estimated Creatinine Clearance: 14.5 mL/min (A) (by C-G formula based on SCr of 4.54 mg/dL (H)).  Liver Function Tests: Recent Labs  Lab 06/07/21 1530 06/08/21 0253 06/09/21 2204 06/11/21 0657  AST 30 29  --   --   ALT 15 14  --   --   ALKPHOS 106 111  --   --   BILITOT 0.8 0.9  --   --   PROT 5.4* 5.9*  --   --   ALBUMIN 2.5* 2.6* 2.3* 2.2*     CBG: Recent Labs  Lab 06/07/21 1526 06/08/21 0611  06/08/21 0814 06/08/21 1210  GLUCAP 81 73 102* 110*     Anemia Panel: Recent Labs    06/09/21 1343  VITAMINB12 954*    Recent Results (from the past 240 hour(s))  Resp Panel by RT-PCR (Flu A&B, Covid) Nasopharyngeal Swab     Status: Abnormal   Collection Time: 06/03/21  6:13 PM   Specimen: Nasopharyngeal Swab; Nasopharyngeal(NP) swabs in vial transport medium  Result Value Ref Range Status   SARS Coronavirus 2 by RT PCR POSITIVE (A) NEGATIVE Final    Comment: (NOTE) SARS-CoV-2 target nucleic acids are DETECTED.  The SARS-CoV-2 RNA is generally detectable in upper respiratory specimens during the acute phase of infection. Positive results are indicative of the presence of the identified virus, but do not rule out bacterial infection or co-infection with other pathogens not detected by the test. Clinical correlation with patient history and other diagnostic information is necessary to determine patient infection status. The expected result is Negative.  Fact Sheet for Patients: EntrepreneurPulse.com.au  Fact Sheet for Healthcare Providers: IncredibleEmployment.be  This test is not yet approved or cleared by the [place] FDA and  has been authorized for detection and/or diagnosis of SARS-CoV-2 by FDA under an Emergency Use Authorization (EUA).  This EUA will remain in effect (meaning this test can be used) for the duration of  the COVID-19 declaration under Section 564(b)(1) of the A ct, 21 U.S.C. section 360bbb-3(b)(1), unless the authorization is terminated or revoked sooner.     Influenza A by PCR NEGATIVE NEGATIVE Final   Influenza B by PCR NEGATIVE NEGATIVE Final    Comment: (NOTE) The Xpert Xpress SARS-CoV-2/FLU/RSV plus assay is intended as an aid in the diagnosis of influenza from Nasopharyngeal swab specimens and should not be used as a sole basis for treatment. Nasal washings and aspirates are unacceptable for Xpert  Xpress SARS-CoV-2/FLU/RSV testing.  Fact Sheet for Patients: EntrepreneurPulse.com.au  Fact Sheet for Healthcare Providers: IncredibleEmployment.be  This test is not yet approved or cleared by the [place] FDA and has been authorized for detection and/or diagnosis of SARS-CoV-2 by FDA under an Emergency Use Authorization (EUA). This EUA will remain in effect (meaning this test can be used) for the duration of the COVID-19 declaration under Section 564(b)(1) of the Act, 21 U.S.C. section 360bbb-3(b)(1), unless the authorization is terminated or revoked.  Performed at [facility], [place] [address] [place], [place] [address]   Blood culture (routine x 2)     Status: None (Preliminary result)   Collection Time: 06/07/21 11:04 PM   Specimen: BLOOD  Result Value Ref Range Status   Specimen Description BLOOD RIGHT HAND  Final   Special Requests   Final    BOTTLES DRAWN AEROBIC AND ANAEROBIC Blood Culture results may not be optimal due to an inadequate volume of blood received in culture bottles   Culture  Final    NO GROWTH 4 DAYS Performed at [facility], [place] [address], [place], [place] [address]    Report Status PENDING  Incomplete  Blood culture (routine x 2)     Status: None (Preliminary result)   Collection Time: 06/08/21  6:01 AM   Specimen: BLOOD  Result Value Ref Range Status   Specimen Description BLOOD SITE NOT SPECIFIED  Final   Special Requests   Final    BOTTLES DRAWN AEROBIC ONLY Blood Culture results may not be optimal due to an inadequate volume of blood received in culture bottles   Culture   Final    NO GROWTH 3 DAYS Performed at [facility], [place] [address], [place], [place] [address]    Report Status PENDING  Incomplete      Radiology Studies: No results found.     LOS: 4 days   Virda Betters [employer] on [email or web address]  06/11/2021, 10:14 AM

## 2021-06-11 NOTE — Assessment & Plan Note (Addendum)
Blood pressure stable for the most part.  Occasional high readings noted.  He is on carvedilol alone.  Continue to monitor for now.  Will not be too aggressive with blood pressure control since he is on dialysis.

## 2021-06-11 NOTE — Progress Notes (Signed)
[facility] Progress Note   Subjective:  Seen on HD - 3L UFG and tolerating so far. Denies CP or dyspnea. Reports that toe doing better. Is stable for discharge but SNF cannot take him back until Monday.  Objective Vitals:   06/10/21 1944 06/11/21 0014 06/11/21 0347 06/11/21 0801  BP: (!) 155/60 (!) 144/93 (!) 164/72 (!) 157/80  Pulse: 60 60 60   Resp: 19 18 18 18   Temp: 98.1 F (36.7 C) 97.7 F (36.5 C) 97.6 F (36.4 C) 97.6 F (36.4 C)  TempSrc: Oral Oral Oral Oral  SpO2: 98% 94% 99% 99%  Weight:  89.3 kg    Height:       Physical Exam General: Well appearing man, NAD. Room air. Heart: RRR; no murmur Lungs: CTAB Abdomen: soft Extremities: Trace BLE edema (L>R), foot bandaged Dialysis Access: TDC + LUE AVF + thrill  Additional Objective Labs: Basic Metabolic Panel: Recent Labs  Lab 06/08/21 0253 06/09/21 2204 06/11/21 0657  NA 133* 135 136  K 4.7 3.5 3.5  CL 92* 100 102  CO2 24 23 24   GLUCOSE 47* 125* 89  BUN 23 23 30*  CREATININE 4.41* 4.23* 4.54*  CALCIUM 8.6* 8.2* 8.2*  PHOS 3.3 3.0 3.5   Liver Function Tests: Recent Labs  Lab 06/07/21 1530 06/08/21 0253 06/09/21 2204 06/11/21 0657  AST 30 29  --   --   ALT 15 14  --   --   ALKPHOS 106 111  --   --   BILITOT 0.8 0.9  --   --   PROT 5.4* 5.9*  --   --   ALBUMIN 2.5* 2.6* 2.3* 2.2*   CBC: Recent Labs  Lab 06/05/21 0741 06/07/21 1530 06/08/21 0601 06/09/21 2204 06/11/21 0657  WBC 4.2 5.0 5.6 4.2 4.7  NEUTROABS 2.7 3.3 4.4  --   --   HGB 9.4* 9.3* 10.1* 9.4* 9.5*  HCT 28.8* 28.0* 31.4* 29.5* 28.9*  MCV 97.0 95.2 96.3 96.1 96.7  PLT 169 182 176 157 145*   Medications:  ceFEPime (MAXIPIME) IV Stopped (06/11/21 0136)   metronidazole 500 mg (06/11/21 0616)   vancomycin      amiodarone  200 mg Oral Daily   atorvastatin  20 mg Oral Daily   carvedilol  6.25 mg Oral BID   Chlorhexidine Gluconate Cloth  6 each Topical Q0600   Chlorhexidine Gluconate Cloth  6 each Topical  Q0600   heparin sodium (porcine)       ipratropium-albuterol  3 mL Nebulization BID   loratadine  10 mg Oral Daily   pantoprazole  40 mg Oral Daily   sodium bicarbonate  650 mg Oral BID   vitamin B-12  500 mcg Oral Daily    Dialysis Orders: [facility] on MWF schedule 4 hours BFR 400 DFR Auto 1.5 EDW 90kg 2K, 2Ca, TDC, no heparin  - Mircera 23mcg IV q 2 weeks- last dose - Venofer 100mg  q HD, 1/23-2/13   Assessment/Plan:  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 On IV antibiotics. Management per primary team/ortho. Recurrent falls: Likely 2/2 osteomyelitis and pain medications. PT/OT consulted. No evidence of hypotension. COVID 19: Positive test on 06/03/21, started on molnupirvavir. Asymptomatic at this time.  ESRD: Continue usual MWF schedule - HD today. PO bicarb on med list for some reason -> d/c.  Hypertension/volume: BP high, on amiodarone and torsemide outpatient. Getting under EDW - likely has lost some weight, will lower on  discharge.  Anemia: Hgb 9.5. Not due for ESA. Will hold further IV iron until acute infection is resolved.  Metabolic bone disease: Ca/Phos good; no binders or VDRA  Nutrition: Alb low, adding supplements. Dispo: Back to SNF on 2/6 per notes.  Veneta Penton, PA-C 06/11/2021, 9:30 AM  [employer]

## 2021-06-11 NOTE — Assessment & Plan Note (Signed)
Seen by PT and OT.  Skilled nursing facility.

## 2021-06-11 NOTE — Assessment & Plan Note (Signed)
Patient not on anticoagulation due to history of GI bleed.  Plus has had frequent falls.  He is noted to be on carvedilol and amiodarone which is being continued.

## 2021-06-11 NOTE — Hospital Course (Addendum)
Darryl Diaz is a 81 y.o. 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  Covid-19 screening test positive on 06/03/21, started on antiviral Molnupiravir on admission.  Was placed on broad-spectrum antibiotics for cellulitis and left foot osteomyelitis.  Seen by orthopedic surgery.  No need for surgical intervention at this time.  Medically stable.  Cannot return to his nursing facility till 06/14/2021 due to [place].

## 2021-06-11 NOTE — Progress Notes (Addendum)
Physical Therapy Treatment Patient Details Name: Darryl Diaz MRN: [id number] DOB: 12-29-1940 Today's Date: 06/11/2021   History of Present Illness Pt is a 81 y.o. 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 Pt with (+) COVID-19 on 1/26. 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    PT Comments    Pt received in supine, agreeable to therapy session, with emphasis on supine/seated LE exercises, transfer training and LLE NWB status instruction. Pt able to state LLE precautions when prompted but seemed to have difficulty fully understanding this in terms of functional tasks. Pt needing up to modA to stand from EOB to L knee walker, and reports RLE too fatigued and pt feeling too unstable to attempt gait using knee walker. Pt noted to have poor R hip strength, unable to perform R hip flexion against resistance and decreased R hip flexion ROM in seated posture. Encouraged frequent supine/seated LE exercises to build strength for pivotal transfers to wheelchair so he can maintain LLE NWB until MD clears him for WB tasks. Pt continues to benefit from PT services to progress toward functional mobility goals, DME updated per discussion with supervising PT Ryan L.   Recommendations for follow up therapy are one component of a multi-disciplinary discharge planning process, led by the attending physician.  Recommendations may be updated based on patient status, additional functional criteria and insurance authorization.  Follow Up Recommendations  Skilled nursing-short term rehab (<3 hours/day) (pt now agreeable to SNF)     Assistance Recommended at Discharge Intermittent Supervision/Assistance (assist for any OOB mobility due to L NWB status)  Patient can  return home with the following A little help with walking and/or transfers;A little help with bathing/dressing/bathroom;Assistance with cooking/housework;Direct supervision/assist for medications management;Direct supervision/assist for financial management;Help with stairs or ramp for entrance;Assist for transportation   Equipment Recommendations  Wheelchair (measurements PT);Wheelchair cushion (measurements PT);BSC/3in1;Other (comment) (w/c with removable arm/leg rests and drop arm BSC, slide board)    Recommendations for Other Services       Precautions / Restrictions Precautions Precautions: Fall Required Braces or Orthoses: Other Brace Other Brace: has post-op darco shoe at home Restrictions Weight Bearing Restrictions: Yes LLE Weight Bearing: Weight bearing as tolerated Other Position/Activity Restrictions: reviewed NWB precautions with pt - pt was able to recall LLE NWB status but seemed confused as to how it would work, reinforced.     Mobility  Bed Mobility Overal bed mobility: Needs Assistance Bed Mobility: Supine to Sit, Sit to Supine     Supine to sit: Min assist, HOB elevated Sit to supine: Min assist   General bed mobility comments: pt with increased confusion and needing at times minA for anterior scooting/requesting HHA for trunk rise. Pt needing BLE assist to return to supine    Transfers Overall transfer level: Needs assistance Equipment used:  (Knee scooter) Transfers: Sit to/from Stand Sit to Stand: Mod assist, From elevated surface          Lateral/Scoot Transfers: Min assist General transfer comment: heavy reliance on bed side rail to pull to standing from bed height and other UE on handle of knee walker; pt needing modA to stand while maintaining LLE NWB. Pt at times needing to heel WB due to instability; minA for seated scooting toward R side to  prepare for lateral scoot transfers to [facility] (did not attempt as pt c/o fatigue at end of session).     Ambulation/Gait             Pre-gait activities: attempted one forward step with RLE while LLE up on knee walker, however pt c/o instability and fear and not agreeable to attempt more.     Stairs             Wheelchair Mobility    Modified Rankin (Stroke Patients Only)       Balance Overall balance assessment: Needs assistance Sitting-balance support: No upper extremity supported, Feet supported Sitting balance-Leahy Scale: Good Sitting balance - Comments: seated BLE exercises at EOB, no LOB   Standing balance support: Bilateral upper extremity supported Standing balance-Leahy Scale: Poor Standing balance comment: pt unable to maintain LLE NWB in stance without either heel WB or mod to maxA, once L knee on knee walker, pt needing min to modA for safety standing at knee walker and c/o RLE fatigue                            Cognition Arousal/Alertness: Awake/alert Behavior During Therapy: WFL for tasks assessed/performed Overall Cognitive Status: No family/caregiver present to determine baseline cognitive functioning Area of Impairment: Memory, Safety/judgement, Awareness                     Memory: Decreased recall of precautions, Decreased short-term memory   Safety/Judgement: Decreased awareness of safety, Decreased awareness of deficits Awareness: Emergent   General Comments: Pt with noted memory deficits and some confusion with cues given, encouraged him to sit up on side of bed and pt asked ~3 questions to understand command before initiating. "You want me to do what?" and command rephrased twice before he understood.        Exercises Other Exercises Other Exercises: supine BLE AROM: ankle pumps, hip abduction, heel slides, SLR (AA) x10 reps ea Other Exercises: seated BLE AROM: hip flexion LAQ (with 3 sec hold) x10 reps ea    General Comments General comments (skin integrity, edema, etc.): BP checked seated EOB and reading  136/50's, HR WFL, SpO2 WLF on RA      Pertinent Vitals/Pain Pain Assessment Pain Assessment: Faces Faces Pain Scale: Hurts a little bit Pain Location: pt unable to localize, grimacing at time with RLE mobility Pain Descriptors / Indicators: Grimacing, Discomfort Pain Intervention(s): Limited activity within patient's tolerance, Monitored during session, Repositioned    Home Living                          Prior Function            PT Goals (current goals can now be found in the care plan section) Acute Rehab PT Goals Patient Stated Goal: "I want to get back to walking if I can. I don't want to lose my leg." PT Goal Formulation: With patient Time For Goal Achievement: 06/23/21 Progress towards PT goals: Progressing toward goals    Frequency    Min 3X/week      PT Plan Current plan remains appropriate    Co-evaluation              AM-PAC PT "6 Clicks" Mobility   Outcome Measure  Help needed turning from your back to your side while in a flat bed without using bedrails?: A Little Help needed moving from lying  on your back to sitting on the side of a flat bed without using bedrails?: A Little Help needed moving to and from a bed to a chair (including a wheelchair)?: A Lot (with L NWB) Help needed standing up from a chair using your arms (e.g., wheelchair or bedside chair)?: A Lot (with L NWB) Help needed to walk in hospital room?: Total (while maintaining L NWB) Help needed climbing 3-5 steps with a railing? : Total (while maintaining L NWB) 6 Click Score: 12    End of Session Equipment Utilized During Treatment: Gait belt Activity Tolerance: Patient tolerated treatment well Patient left: in bed;with call bell/phone within reach;with bed alarm set Nurse Communication: Mobility status;Other (comment);Weight bearing status (pt may need +2 for safety with scoot or stand pivot to [facility] to prevent LLE WB; otherwise, use bed pan if pt unable to avoid L WB) PT  Visit Diagnosis: Other abnormalities of gait and mobility (R26.89);History of falling (Z91.81)     Time: 8315-1761 PT Time Calculation (min) (ACUTE ONLY): 32 min  Charges:  $Therapeutic Exercise: 8-22 mins $Therapeutic Activity: 8-22 mins                     Kearney Evitt P., PTA Acute Rehabilitation Services Pager: [phone] Office: [place] 06/11/2021, 4:57 PM

## 2021-06-11 NOTE — Assessment & Plan Note (Addendum)
Incidentally positive on 06/03/2021. Treated with Molnupiravir.  Asymptomatic for the most part.  Saturating normal on room air. Patient has completed 10 days of isolation.

## 2021-06-11 NOTE — Assessment & Plan Note (Signed)
Hemoglobin low but stable.  No evidence for any overt blood loss.

## 2021-06-12 DIAGNOSIS — M86272 Subacute osteomyelitis, left ankle and foot: Secondary | ICD-10-CM | POA: Diagnosis not present

## 2021-06-12 DIAGNOSIS — D638 Anemia in other chronic diseases classified elsewhere: Secondary | ICD-10-CM | POA: Diagnosis not present

## 2021-06-12 DIAGNOSIS — L03116 Cellulitis of left lower limb: Secondary | ICD-10-CM | POA: Diagnosis not present

## 2021-06-12 LAB — CULTURE, BLOOD (ROUTINE X 2): Culture: NO GROWTH

## 2021-06-12 LAB — VANCOMYCIN, RANDOM: Vancomycin Rm: 24

## 2021-06-12 NOTE — Progress Notes (Signed)
[facility] PROGRESS NOTE   Darryl Diaz EHO:[id number] DOB: 05-Sep-1940 DOA: 06/07/2021  5 DOS: the patient was seen and examined on 06/12/2021  PCP: [name], MD  Brief History and Hospital Course:  Darryl Diaz is a 81 y.o. male with medical history significant for ESRD on HD MWF (still makes urine), paroxysmal atrial fibrillation not on anticoagulation, hypertension, anemia of chronic kidney disease with associated baseline hemoglobin 8-11K, s/p left great toe amputation, who is admitted to [facility] on 06/07/2021 with concern for osteomyelitis of the left second toe after presenting from home to [facility] ED complaining of balance issues and frequent falls.  Covid-19 screening test positive on 06/03/21, started on antiviral Molnupiravir on admission.  Was placed on broad-spectrum antibiotics for cellulitis and left foot osteomyelitis.  Seen by orthopedic surgery.  No need for surgical intervention at this time.  Medically stable.  Cannot return to his nursing facility till 06/14/2021 due to [place].    Consultants: Orthopedics.  Infectious disease.  Nephrology  Procedures: None except for hemodialysis    Subjective: Patient feels well.  Eating his breakfast this morning.  Denies any pain.  No shortness of breath.     Assessment/Plan:   * 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[place])- (present on admission) See under cellulitis.  Cellulitis of left foot- (present on admission) Second toe proximal phalanx and mid to distal second metatarsal osteomyelitis seen on left foot MRI  He is s/p left great toe amputation during previous hospitalization. Follows with Dr. Sharol Given outpatient. Patient underwent MRI.  Was seen by Dr. Sharol Given.  Also seen by infectious disease due to concern for osteomyelitis.  No surgical intervention is planned.  Blood cultures have been negative. Patient was placed on vancomycin cefepime and metronidazole. Cellulitis has improved. Plan is to treat with  vancomycin cefepime and oral metronidazole for 6 weeks.  He will get the vancomycin and cefepime with his hemodialysis 3 days a week.  Patient seen by physical therapy.    Patient to return to his facility but will need to go to the skilled nursing level of care rather than independent level.  Incidental finding of COVID-19 virus infection- (present on admission) Incidentally positive on 06/03/2021. Treated with Molnupiravir.  Asymptomatic for the most part.  Saturating normal on room air. He will complete his 10 days of isolation on 2/5.  He can come off of isolation on 2/6.  ESRD on dialysis [facility]) Nephrology is following.  He is on a Monday Wednesday Friday schedule.    AF (paroxysmal atrial fibrillation) ([place])- (present on admission) Patient not on anticoagulation due to history of GI bleed.  Plus has had frequent falls.  He is noted to be on carvedilol and amiodarone which is being continued.  Anemia, chronic disease- (present on admission) Hemoglobin low but stable.  No evidence for any overt blood loss.  HTN (hypertension)- (present on admission) Blood pressure stable for the most part.  Occasional high readings noted.  He is on carvedilol alone.  Continue to monitor for now.  Will not be too aggressive with blood pressure control since he is on dialysis.  Frequent falls Seen by PT and OT.  Skilled nursing facility.     DVT Prophylaxis: Subcutaneous heparin Code Status: DNR Family Communication: Discussed with patient.  No family at bedside Disposition Plan: SNF on 2/6  Status is: Inpatient  Remains inpatient appropriate because: COVID-19, cellulitis of left foot       Medications: Scheduled:  (feeding supplement)  PROSource Plus  30 mL Oral BID BM   amiodarone  200 mg Oral Daily   atorvastatin  20 mg Oral Daily   carvedilol  6.25 mg Oral BID   Chlorhexidine Gluconate Cloth  6 each Topical Q0600   Chlorhexidine Gluconate Cloth  6 each Topical Q0600   heparin  injection (subcutaneous)  5,000 Units Subcutaneous Q8H   ipratropium-albuterol  3 mL Nebulization BID   loratadine  10 mg Oral Daily   pantoprazole  40 mg Oral Daily   vitamin B-12  500 mcg Oral Daily   Continuous:  ceFEPime (MAXIPIME) IV 2 g (06/11/21 1832)   metronidazole 500 mg (06/12/21 0551)   vancomycin 750 mg (06/11/21 1223)   NWG:[id number] **OR** acetaminophen, albuterol  Antibiotics: Anti-infectives (From admission, onward)    Start     Dose/Rate Route Frequency Ordered Stop   06/11/21 1800  ceFEPIme (MAXIPIME) 2 g in sodium chloride 0.9 % 100 mL IVPB        2 g 200 mL/hr over 30 Minutes Intravenous Every M-W-F (1800) 06/11/21 1138     06/11/21 0000  ceFEPIme 1 g in sodium chloride 0.9 % 100 mL  Status:  Discontinued        1 g Intravenous Every M-W-F (1800) 06/10/21 0849 06/10/21    06/11/21 0000  ceFEPIme 2 g in sodium chloride 0.9 % 100 mL        2 g Intravenous Every M-W-F (1800) 06/10/21 1217 07/23/21 2359   06/10/21 0115  vancomycin (VANCOREADY) IVPB 750 mg/150 mL        750 mg 150 mL/hr over 60 Minutes Intravenous  Once 06/10/21 0016 06/10/21 0243   06/10/21 0115  ceFEPIme (MAXIPIME) 1 g in sodium chloride 0.9 % 100 mL IVPB  Status:  Discontinued        1 g 200 mL/hr over 30 Minutes Intravenous Every 24 hours 06/10/21 0016 06/11/21 1138   06/10/21 0000  metroNIDAZOLE (FLAGYL) 500 MG tablet        500 mg Oral 2 times daily 06/10/21 0849 07/22/21 2359   06/10/21 0000  vancomycin (VANCOREADY) 750 MG/150ML SOLN        750 mg Intravenous Every M-W-F (Hemodialysis) 06/10/21 0849 07/22/21 2359   06/09/21 1800  ceFEPIme (MAXIPIME) 2 g in sodium chloride 0.9 % 100 mL IVPB  Status:  Discontinued        2 g 200 mL/hr over 30 Minutes Intravenous Every M-W-F (1800) 06/09/21 1028 06/10/21 0016   06/09/21 1200  vancomycin (VANCOCIN) IVPB 1000 mg/200 mL premix  Status:  Discontinued        1,000 mg 200 mL/hr over 60 Minutes Intravenous Every M-W-F (Hemodialysis) 06/08/21  0800 06/09/21 1019   06/09/21 1200  vancomycin (VANCOREADY) IVPB 750 mg/150 mL        750 mg 150 mL/hr over 60 Minutes Intravenous Every M-W-F (Hemodialysis) 06/09/21 1019     06/08/21 1800  ceFEPIme (MAXIPIME) 1 g in sodium chloride 0.9 % 100 mL IVPB  Status:  Discontinued        1 g 200 mL/hr over 30 Minutes Intravenous Every 24 hours 06/08/21 1611 06/09/21 1028   06/08/21 1715  vancomycin (VANCOREADY) IVPB 750 mg/150 mL        750 mg 150 mL/hr over 60 Minutes Intravenous  Once 06/08/21 1622 06/08/21 1758   06/08/21 1645  metroNIDAZOLE (FLAGYL) IVPB 500 mg        500 mg 100 mL/hr over 60 Minutes Intravenous Every 12 hours  06/08/21 1546     06/08/21 1608  vancomycin variable dose per unstable renal function (pharmacist dosing)  Status:  Discontinued         Does not apply See admin instructions 06/08/21 1608 06/08/21 1622   06/08/21 0600  piperacillin-tazobactam (ZOSYN) IVPB 2.25 g  Status:  Discontinued        2.25 g 100 mL/hr over 30 Minutes Intravenous Every 8 hours 06/07/21 2111 06/07/21 2151   06/07/21 2330  molnupiravir EUA (LAGEVRIO) capsule 800 mg        4 capsule Oral 2 times daily 06/07/21 2156 06/10/21 0022   06/07/21 2200  cefTRIAXone (ROCEPHIN) 2 g in sodium chloride 0.9 % 100 mL IVPB  Status:  Discontinued        2 g 200 mL/hr over 30 Minutes Intravenous Every 24 hours 06/07/21 2151 06/08/21 1548   06/07/21 2109  vancomycin variable dose per unstable renal function (pharmacist dosing)  Status:  Discontinued         Does not apply See admin instructions 06/07/21 2109 06/08/21 0800   06/07/21 2015  vancomycin (VANCOREADY) IVPB 1750 mg/350 mL        1,750 mg 175 mL/hr over 120 Minutes Intravenous  Once 06/07/21 2002 06/08/21 0127   06/07/21 1945  piperacillin-tazobactam (ZOSYN) IVPB 3.375 g  Status:  Discontinued        3.375 g 100 mL/hr over 30 Minutes Intravenous  Once 06/07/21 1937 06/07/21 2151       Objective:  Vital Signs  Vitals:   06/12/21 0456 06/12/21  0703 06/12/21 0753 06/12/21 0806  BP: (!) 150/64   (!) 150/65  Pulse: 63   68  Resp: 19   15  Temp:    98 F (36.7 C)  TempSrc:    Oral  SpO2: 100% 100% 97% 97%  Weight:      Height:        Intake/Output Summary (Last 24 hours) at 06/12/2021 0914 Last data filed at 06/11/2021 2259 Gross per 24 hour  Intake 1015.87 ml  Output 2003 ml  Net -987.13 ml    Filed Weights   06/09/21 0659 06/11/21 0014 06/11/21 0815  Weight: 89.5 kg 89.3 kg 89.3 kg    General appearance: Awake alert.  In no distress Resp: Clear to auscultation bilaterally.  Normal effort Cardio: S1-S2 is normal regular.  No S3-S4.  No rubs murmurs or bruit GI: Abdomen is soft.  Nontender nondistended.  Bowel sounds are present normal.  No masses organomegaly Extremities: Continues to have swelling and mild erythema of the left foot.  Though it is stable compared to before and improved from the time of admission.  No drainage noted from the great toe amputation site on the left. Neurologic:  No focal neurological deficits.    Lab Results:  Data Reviewed: I have personally reviewed labs and imaging study reports  CBC: Recent Labs  Lab 06/07/21 1530 06/08/21 0601 06/09/21 2204 06/11/21 0657  WBC 5.0 5.6 4.2 4.7  NEUTROABS 3.3 4.4  --   --   HGB 9.3* 10.1* 9.4* 9.5*  HCT 28.0* 31.4* 29.5* 28.9*  MCV 95.2 96.3 96.1 96.7  PLT 182 176 157 145*     Basic Metabolic Panel: Recent Labs  Lab 06/07/21 1530 06/08/21 0253 06/09/21 2204 06/11/21 0657  NA 130* 133* 135 136  K 4.7 4.7 3.5 3.5  CL 93* 92* 100 102  CO2 24 24 23 24   GLUCOSE 75 47* 125* 89  BUN 20 23  23 30*  CREATININE 4.25* 4.41* 4.23* 4.54*  CALCIUM 8.5* 8.6* 8.2* 8.2*  MG 1.9 2.0  --   --   PHOS  --  3.3 3.0 3.5     GFR: Estimated Creatinine Clearance: 14.5 mL/min (A) (by C-G formula based on SCr of 4.54 mg/dL (H)).  Liver Function Tests: Recent Labs  Lab 06/07/21 1530 06/08/21 0253 06/09/21 2204 06/11/21 0657  AST 30 29  --   --    ALT 15 14  --   --   ALKPHOS 106 111  --   --   BILITOT 0.8 0.9  --   --   PROT 5.4* 5.9*  --   --   ALBUMIN 2.5* 2.6* 2.3* 2.2*      CBG: Recent Labs  Lab 06/07/21 1526 06/08/21 0611 06/08/21 0814 06/08/21 1210  GLUCAP 81 73 102* 110*      Anemia Panel: Recent Labs    06/09/21 1343  VITAMINB12 954*     Recent Results (from the past 240 hour(s))  Resp Panel by RT-PCR (Flu A&B, Covid) Nasopharyngeal Swab     Status: Abnormal   Collection Time: 06/03/21  6:13 PM   Specimen: Nasopharyngeal Swab; Nasopharyngeal(NP) swabs in vial transport medium  Result Value Ref Range Status   SARS Coronavirus 2 by RT PCR POSITIVE (A) NEGATIVE Final    Comment: (NOTE) SARS-CoV-2 target nucleic acids are DETECTED.  The SARS-CoV-2 RNA is generally detectable in upper respiratory specimens during the acute phase of infection. Positive results are indicative of the presence of the identified virus, but do not rule out bacterial infection or co-infection with other pathogens not detected by the test. Clinical correlation with patient history and other diagnostic information is necessary to determine patient infection status. The expected result is Negative.  Fact Sheet for Patients: EntrepreneurPulse.com.au  Fact Sheet for Healthcare Providers: IncredibleEmployment.be  This test is not yet approved or cleared by the [place] FDA and  has been authorized for detection and/or diagnosis of SARS-CoV-2 by FDA under an Emergency Use Authorization (EUA).  This EUA will remain in effect (meaning this test can be used) for the duration of  the COVID-19 declaration under Section 564(b)(1) of the A ct, 21 U.S.C. section 360bbb-3(b)(1), unless the authorization is terminated or revoked sooner.     Influenza A by PCR NEGATIVE NEGATIVE Final   Influenza B by PCR NEGATIVE NEGATIVE Final    Comment: (NOTE) The Xpert Xpress SARS-CoV-2/FLU/RSV plus assay  is intended as an aid in the diagnosis of influenza from Nasopharyngeal swab specimens and should not be used as a sole basis for treatment. Nasal washings and aspirates are unacceptable for Xpert Xpress SARS-CoV-2/FLU/RSV testing.  Fact Sheet for Patients: EntrepreneurPulse.com.au  Fact Sheet for Healthcare Providers: IncredibleEmployment.be  This test is not yet approved or cleared by the [place] FDA and has been authorized for detection and/or diagnosis of SARS-CoV-2 by FDA under an Emergency Use Authorization (EUA). This EUA will remain in effect (meaning this test can be used) for the duration of the COVID-19 declaration under Section 564(b)(1) of the Act, 21 U.S.C. section 360bbb-3(b)(1), unless the authorization is terminated or revoked.  Performed at [facility], [place] [address], [place], [place] [address]   Blood culture (routine x 2)     Status: None (Preliminary result)   Collection Time: 06/07/21 11:04 PM   Specimen: BLOOD  Result Value Ref Range Status   Specimen Description BLOOD RIGHT HAND  Final  Special Requests   Final    BOTTLES DRAWN AEROBIC AND ANAEROBIC Blood Culture results may not be optimal due to an inadequate volume of blood received in culture bottles   Culture   Final    NO GROWTH 4 DAYS Performed at [facility], [place] [address] [place], [place] [address]    Report Status PENDING  Incomplete  Blood culture (routine x 2)     Status: None (Preliminary result)   Collection Time: 06/08/21  6:01 AM   Specimen: BLOOD  Result Value Ref Range Status   Specimen Description BLOOD SITE NOT SPECIFIED  Final   Special Requests   Final    BOTTLES DRAWN AEROBIC ONLY Blood Culture results may not be optimal due to an inadequate volume of blood received in culture bottles   Culture   Final    NO GROWTH 3 DAYS Performed at [facility], [place] [address] [place], [place] [address]    Report Status  PENDING  Incomplete       Radiology Studies: No results found.     LOS: 5 days   Starlee Corralejo [employer] on [email or web address]  06/12/2021, 9:14 AM

## 2021-06-12 NOTE — Progress Notes (Signed)
Pharmacy Antibiotic Note  Darryl Diaz is a 81 y.o. male admitted on 06/07/2021 with osteomyelitis. Pharmacy was consulted for vancomycin dosing yesterday. Pharmacy is now consulted to dose cefepime for osteomyelitis.  Pt has ESRD and on iHD MWF (last HD was 1/27, missed yesterday; per HD RN, pt in HD now for shortened 3-hr session, due to staffing); per provider note, pt still makes urine  Plan for vanc/cefepime/flagyl until 3/13 per ID. PreHD vanc level came back in range today at 24 (goal 15-25).   Plan:  Vanc 750mg  MWF Cefepime 2g IV MWF Levels if needed  Height: 6' (182.9 cm) Weight: 89.3 kg (196 lb 13.9 oz) IBW/kg (Calculated) : 77.6  Temp (24hrs), Avg:98 F (36.7 C), Min:97.8 F (36.6 C), Max:98.1 F (36.7 C)  Recent Labs  Lab 06/07/21 1530 06/08/21 0253 06/08/21 0601 06/09/21 2204 06/11/21 0657 06/12/21 0654  WBC 5.0  --  5.6 4.2 4.7  --   CREATININE 4.25* 4.41*  --  4.23* 4.54*  --   VANCORANDOM  --   --   --   --   --  24     Estimated Creatinine Clearance: 14.5 mL/min (A) (by C-G formula based on SCr of 4.54 mg/dL (H)).    Allergies  Allergen Reactions   Penicillins Rash   Antimicrobials This Admission: Vanc 1/30>>3/13 Ctx 1/30 x1 Cefepime 1/31>>3/13 Flagyl 1/31>>3/13 Molnupiravir 1/30 >> (2/2)  Microbiology Data: 1/31 HBV surface Ag: negative 1/31 Bld cx X 2: NGTD  Onnie Boer, PharmD, BCIDP, AAHIVP, CPP Infectious Disease Pharmacist 06/12/2021 11:06 AM

## 2021-06-12 NOTE — Progress Notes (Signed)
[name] Progress Note   Subjective:  Seen in room - no issues today. No CP/dyspnea. HD went fine yesterday. Plan is to d/c on Monday after HD.  Objective Vitals:   06/12/21 0456 06/12/21 0703 06/12/21 0753 06/12/21 0806  BP: (!) 150/64   (!) 150/65  Pulse: 63   68  Resp: 19   15  Temp:    98 F (36.7 C)  TempSrc:    Oral  SpO2: 100% 100% 97% 97%  Weight:      Height:       Physical Exam General: Well appearing man, NAD. Room air. Heart: RRR; no murmur Lungs: CTAB Abdomen: soft Extremities: Trace BLE edema (L>R), foot bandaged Dialysis Access: TDC + LUE AVF + thrill  Additional Objective Labs: Basic Metabolic Panel: Recent Labs  Lab 06/08/21 0253 06/09/21 2204 06/11/21 0657  NA 133* 135 136  K 4.7 3.5 3.5  CL 92* 100 102  CO2 24 23 24   GLUCOSE 47* 125* 89  BUN 23 23 30*  CREATININE 4.41* 4.23* 4.54*  CALCIUM 8.6* 8.2* 8.2*  PHOS 3.3 3.0 3.5   Liver Function Tests: Recent Labs  Lab 06/07/21 1530 06/08/21 0253 06/09/21 2204 06/11/21 0657  AST 30 29  --   --   ALT 15 14  --   --   ALKPHOS 106 111  --   --   BILITOT 0.8 0.9  --   --   PROT 5.4* 5.9*  --   --   ALBUMIN 2.5* 2.6* 2.3* 2.2*   CBC: Recent Labs  Lab 06/07/21 1530 06/08/21 0601 06/09/21 2204 06/11/21 0657  WBC 5.0 5.6 4.2 4.7  NEUTROABS 3.3 4.4  --   --   HGB 9.3* 10.1* 9.4* 9.5*  HCT 28.0* 31.4* 29.5* 28.9*  MCV 95.2 96.3 96.1 96.7  PLT 182 176 157 145*   Medications:  ceFEPime (MAXIPIME) IV 2 g (06/11/21 1832)   metronidazole 500 mg (06/12/21 0551)   vancomycin 750 mg (06/11/21 1223)    (feeding supplement) PROSource Plus  30 mL Oral BID BM   amiodarone  200 mg Oral Daily   atorvastatin  20 mg Oral Daily   carvedilol  6.25 mg Oral BID   Chlorhexidine Gluconate Cloth  6 each Topical Q0600   Chlorhexidine Gluconate Cloth  6 each Topical Q0600   heparin injection (subcutaneous)  5,000 Units Subcutaneous Q8H   ipratropium-albuterol  3 mL Nebulization BID    loratadine  10 mg Oral Daily   pantoprazole  40 mg Oral Daily   vitamin B-12  500 mcg Oral Daily    Dialysis Orders: [facility] on MWF schedule 4 hours BFR 400 DFR Auto 1.5 EDW 90kg 2K, 2Ca, TDC, no heparin  - Mircera 266mcg IV q 2 weeks- last dose - Venofer 100mg  q HD, 1/23-2/13   Assessment/Plan:  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 On IV antibiotics. Management per primary team/ortho. Recurrent falls: Likely 2/2 osteomyelitis and pain medications. PT/OT consulted. No evidence of hypotension. COVID 19: Positive test on 06/03/21, s/p course molnupirvavir. Asymptomatic at this time.  ESRD: Continue usual MWF schedule - next HD 2/6 (planning AM HD prior to discharge).  Hypertension/volume: BP high side, on amiodarone and torsemide outpatient. Getting under EDW - likely has lost some weight, will lower on discharge.  Anemia: Hgb 9.5. Not due for ESA. Will hold further IV iron until acute infection is resolved.  Metabolic bone disease: Ca/Phos good;  no binders or VDRA  Nutrition: Alb low, adding supplements. Dispo: Back to SNF on 2/6 per notes.    Darryl Penton, PA-C 06/12/2021, 9:12 AM  [employer]

## 2021-06-12 NOTE — Progress Notes (Signed)
Patient ID: Darryl Diaz, male   DOB: 1940/09/24, 81 y.o.   MRN: [id number] Patient's foot continues to improve the cellulitis has resolved the ulcer has granulation tissue without drainage.  New dressing was applied.  Plan for discharge on Monday.

## 2021-06-13 ENCOUNTER — Encounter: Payer: Self-pay | Admitting: Orthopedic Surgery

## 2021-06-13 DIAGNOSIS — M86272 Subacute osteomyelitis, left ankle and foot: Secondary | ICD-10-CM | POA: Diagnosis not present

## 2021-06-13 DIAGNOSIS — L03116 Cellulitis of left lower limb: Secondary | ICD-10-CM | POA: Diagnosis not present

## 2021-06-13 DIAGNOSIS — D638 Anemia in other chronic diseases classified elsewhere: Secondary | ICD-10-CM | POA: Diagnosis not present

## 2021-06-13 LAB — CULTURE, BLOOD (ROUTINE X 2): Culture: NO GROWTH

## 2021-06-13 MED ORDER — CHLORHEXIDINE GLUCONATE CLOTH 2 % EX PADS
6.0000 | MEDICATED_PAD | Freq: Every day | CUTANEOUS | Status: DC
  Administered 2021-06-13 – 2021-06-14 (×2): 6 via TOPICAL

## 2021-06-13 NOTE — Progress Notes (Signed)
[facility] PROGRESS NOTE   Darryl Diaz PZW:[id number] DOB: [birth year] DOA: 06/07/2021  6 DOS: the patient was seen and examined on 06/13/2021  PCP: [name], MD  Brief History and Hospital Course:  Darryl Diaz is a 81 y.o. 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  Covid-19 screening test positive on 06/03/21, started on antiviral Molnupiravir on admission.  Was placed on broad-spectrum antibiotics for cellulitis and left foot osteomyelitis.  Seen by orthopedic surgery.  No need for surgical intervention at this time.  Medically stable.  Cannot return to his nursing facility till 06/14/2021 due to [place].    Consultants: Orthopedics.  Infectious disease.  Nephrology  Procedures: None except for hemodialysis    Subjective: Patient feels well.  Denies any complaints this morning.  Left foot feels much better.  No shortness of breath.  No nausea or vomiting.   Assessment/Plan:   * 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[place])- (present on admission) See under cellulitis.  Cellulitis of left foot- (present on admission) Second toe proximal phalanx and mid to distal second metatarsal osteomyelitis seen on left foot MRI  He is s/p left great toe amputation during previous hospitalization. Follows with Dr. Sharol Given outpatient. Patient underwent MRI.  Was seen by Dr. Sharol Given.  Also seen by infectious disease due to concern for osteomyelitis.  No surgical intervention is planned.  Blood cultures have been negative. Patient was placed on vancomycin cefepime and metronidazole. Cellulitis has  improved. Plan is to treat with vancomycin cefepime and oral metronidazole for 6 weeks.  He will get the vancomycin and cefepime with his hemodialysis 3 days a week.  Patient seen by physical therapy.    Patient to return to his facility but will need to go to the skilled nursing level of care rather than independent level.  Incidental finding of COVID-19 virus infection- (present on admission) Incidentally positive on 06/03/2021. Treated with Molnupiravir.  Asymptomatic for the most part.  Saturating normal on room air. He will complete his 10 days of isolation on 2/5.  He can come off of isolation on 2/6.  ESRD on dialysis [facility]) Nephrology is following.  He is on a Monday Wednesday Friday schedule.    AF (paroxysmal atrial fibrillation) ([place])- (present on admission) Patient not on anticoagulation due to history of GI bleed.  Plus has had frequent falls.  He is noted to be on carvedilol and amiodarone which is being continued.  Anemia, chronic disease- (present on admission) Hemoglobin low but stable.  No evidence for any overt blood loss.  HTN (hypertension)- (present on admission) Blood pressure stable for the most part.  Occasional high readings noted.  He is on carvedilol alone.  Continue to monitor for now.  Will not be too aggressive with blood pressure control since he is on dialysis.  Frequent falls Seen by PT and OT.  Skilled nursing facility.     DVT Prophylaxis: Subcutaneous heparin Code Status: DNR Family Communication: Discussed with patient.  No family at bedside Disposition Plan: SNF on 2/6  Status is: Inpatient  Remains inpatient appropriate because: COVID-19, cellulitis of left foot  Medications: Scheduled:  (feeding supplement) PROSource Plus  30 mL Oral BID BM   amiodarone  200 mg Oral Daily   atorvastatin  20 mg Oral Daily   carvedilol  6.25 mg Oral BID   Chlorhexidine Gluconate Cloth  6 each Topical Q0600   Chlorhexidine Gluconate Cloth  6  each Topical Q0600   heparin injection (subcutaneous)  5,000 Units Subcutaneous Q8H   ipratropium-albuterol  3 mL Nebulization BID   loratadine  10 mg Oral Daily   pantoprazole  40 mg Oral Daily   vitamin B-12  500 mcg Oral Daily   Continuous:  ceFEPime (MAXIPIME) IV 2 g (06/11/21 1832)   metronidazole 500 mg (06/13/21 0505)   vancomycin 750 mg (06/11/21 1223)   LDJ:[id number] **OR** acetaminophen, albuterol  Antibiotics: Anti-infectives (From admission, onward)    Start     Dose/Rate Route Frequency Ordered Stop   06/11/21 1800  ceFEPIme (MAXIPIME) 2 g in sodium chloride 0.9 % 100 mL IVPB        2 g 200 mL/hr over 30 Minutes Intravenous Every M-W-F (1800) 06/11/21 1138 07/19/21 2359   06/11/21 0000  ceFEPIme 1 g in sodium chloride 0.9 % 100 mL  Status:  Discontinued        1 g Intravenous Every M-W-F (1800) 06/10/21 0849 06/10/21    06/11/21 0000  ceFEPIme 2 g in sodium chloride 0.9 % 100 mL        2 g Intravenous Every M-W-F (1800) 06/10/21 1217 07/23/21 2359   06/10/21 0115  vancomycin (VANCOREADY) IVPB 750 mg/150 mL        750 mg 150 mL/hr over 60 Minutes Intravenous  Once 06/10/21 0016 06/10/21 0243   06/10/21 0115  ceFEPIme (MAXIPIME) 1 g in sodium chloride 0.9 % 100 mL IVPB  Status:  Discontinued        1 g 200 mL/hr over 30 Minutes Intravenous Every 24 hours 06/10/21 0016 06/11/21 1138   06/10/21 0000  metroNIDAZOLE (FLAGYL) 500 MG tablet        500 mg Oral 2 times daily 06/10/21 0849 07/22/21 2359   06/10/21 0000  vancomycin (VANCOREADY) 750 MG/150ML SOLN        750 mg Intravenous Every M-W-F (Hemodialysis) 06/10/21 0849 07/22/21 2359   06/09/21 1800  ceFEPIme (MAXIPIME) 2 g in sodium chloride 0.9 % 100 mL IVPB  Status:  Discontinued        2 g 200 mL/hr over 30 Minutes Intravenous Every M-W-F (1800) 06/09/21 1028 06/10/21 0016   06/09/21 1200  vancomycin (VANCOCIN) IVPB 1000 mg/200 mL premix  Status:  Discontinued        1,000 mg 200 mL/hr over 60 Minutes  Intravenous Every M-W-F (Hemodialysis) 06/08/21 0800 06/09/21 1019   06/09/21 1200  vancomycin (VANCOREADY) IVPB 750 mg/150 mL        750 mg 150 mL/hr over 60 Minutes Intravenous Every M-W-F (Hemodialysis) 06/09/21 1019 07/19/21 2359   06/08/21 1800  ceFEPIme (MAXIPIME) 1 g in sodium chloride 0.9 % 100 mL IVPB  Status:  Discontinued        1 g 200 mL/hr over 30 Minutes Intravenous Every 24 hours 06/08/21 1611 06/09/21 1028   06/08/21 1715  vancomycin (VANCOREADY) IVPB 750 mg/150 mL        750 mg 150 mL/hr over 60 Minutes Intravenous  Once 06/08/21 1622 06/08/21 1758   06/08/21 1645  metroNIDAZOLE (FLAGYL) IVPB 500 mg        500 mg 100 mL/hr over 60  Minutes Intravenous Every 12 hours 06/08/21 1546 07/19/21 2359   06/08/21 1608  vancomycin variable dose per unstable renal function (pharmacist dosing)  Status:  Discontinued         Does not apply See admin instructions 06/08/21 1608 06/08/21 1622   06/08/21 0600  piperacillin-tazobactam (ZOSYN) IVPB 2.25 g  Status:  Discontinued        2.25 g 100 mL/hr over 30 Minutes Intravenous Every 8 hours 06/07/21 2111 06/07/21 2151   06/07/21 2330  molnupiravir EUA (LAGEVRIO) capsule 800 mg        4 capsule Oral 2 times daily 06/07/21 2156 06/10/21 0022   06/07/21 2200  cefTRIAXone (ROCEPHIN) 2 g in sodium chloride 0.9 % 100 mL IVPB  Status:  Discontinued        2 g 200 mL/hr over 30 Minutes Intravenous Every 24 hours 06/07/21 2151 06/08/21 1548   06/07/21 2109  vancomycin variable dose per unstable renal function (pharmacist dosing)  Status:  Discontinued         Does not apply See admin instructions 06/07/21 2109 06/08/21 0800   06/07/21 2015  vancomycin (VANCOREADY) IVPB 1750 mg/350 mL        1,750 mg 175 mL/hr over 120 Minutes Intravenous  Once 06/07/21 2002 06/08/21 0127   06/07/21 1945  piperacillin-tazobactam (ZOSYN) IVPB 3.375 g  Status:  Discontinued        3.375 g 100 mL/hr over 30 Minutes Intravenous  Once 06/07/21 1937 06/07/21 2151        Objective:  Vital Signs  Vitals:   06/13/21 0429 06/13/21 0500 06/13/21 0738 06/13/21 0811  BP: (!) 156/64   121/67  Pulse:    62  Resp: 17   20  Temp: (!) 97 F (36.1 C)   97.6 F (36.4 C)  TempSrc: Oral   Oral  SpO2: 97%  97% 98%  Weight:  89 kg    Height:       No intake or output data in the 24 hours ending 06/13/21 0859  Filed Weights   06/11/21 0014 06/11/21 0815 06/13/21 0500  Weight: 89.3 kg 89.3 kg 89 kg    General appearance: Awake alert.  In no distress Resp: Clear to auscultation bilaterally.  Normal effort Cardio: S1-S2 is normal regular.  No S3-S4.  No rubs murmurs or bruit GI: Abdomen is soft.  Nontender nondistended.  Bowel sounds are present normal.  No masses organomegaly Extremities: Left foot is stable with swelling and improving erythema.  Seen over the site of the great toe amputation. Neurologic:  No focal neurological deficits.    Lab Results:  Data Reviewed: I have personally reviewed labs and imaging study reports  CBC: Recent Labs  Lab 06/07/21 1530 06/08/21 0601 06/09/21 2204 06/11/21 0657  WBC 5.0 5.6 4.2 4.7  NEUTROABS 3.3 4.4  --   --   HGB 9.3* 10.1* 9.4* 9.5*  HCT 28.0* 31.4* 29.5* 28.9*  MCV 95.2 96.3 96.1 96.7  PLT 182 176 157 145*     Basic Metabolic Panel: Recent Labs  Lab 06/07/21 1530 06/08/21 0253 06/09/21 2204 06/11/21 0657  NA 130* 133* 135 136  K 4.7 4.7 3.5 3.5  CL 93* 92* 100 102  CO2 24 24 23 24   GLUCOSE 75 47* 125* 89  BUN 20 23 23  30*  CREATININE 4.25* 4.41* 4.23* 4.54*  CALCIUM 8.5* 8.6* 8.2* 8.2*  MG 1.9 2.0  --   --   PHOS  --  3.3 3.0 3.5  GFR: Estimated Creatinine Clearance: 14.5 mL/min (A) (by C-G formula based on SCr of 4.54 mg/dL (H)).  Liver Function Tests: Recent Labs  Lab 06/07/21 1530 06/08/21 0253 06/09/21 2204 06/11/21 0657  AST 30 29  --   --   ALT 15 14  --   --   ALKPHOS 106 111  --   --   BILITOT 0.8 0.9  --   --   PROT 5.4* 5.9*  --   --   ALBUMIN 2.5*  2.6* 2.3* 2.2*      CBG: Recent Labs  Lab 06/07/21 1526 06/08/21 0611 06/08/21 0814 06/08/21 1210  GLUCAP 81 73 102* 110*       Recent Results (from the past 240 hour(s))  Resp Panel by RT-PCR (Flu A&B, Covid) Nasopharyngeal Swab     Status: Abnormal   Collection Time: 06/03/21  6:13 PM   Specimen: Nasopharyngeal Swab; Nasopharyngeal(NP) swabs in vial transport medium  Result Value Ref Range Status   SARS Coronavirus 2 by RT PCR POSITIVE (A) NEGATIVE Final    Comment: (NOTE) SARS-CoV-2 target nucleic acids are DETECTED.  The SARS-CoV-2 RNA is generally detectable in upper respiratory specimens during the acute phase of infection. Positive results are indicative of the presence of the identified virus, but do not rule out bacterial infection or co-infection with other pathogens not detected by the test. Clinical correlation with patient history and other diagnostic information is necessary to determine patient infection status. The expected result is Negative.  Fact Sheet for Patients: EntrepreneurPulse.com.au  Fact Sheet for Healthcare Providers: IncredibleEmployment.be  This test is not yet approved or cleared by the [place] FDA and  has been authorized for detection and/or diagnosis of SARS-CoV-2 by FDA under an Emergency Use Authorization (EUA).  This EUA will remain in effect (meaning this test can be used) for the duration of  the COVID-19 declaration under Section 564(b)(1) of the A ct, 21 U.S.C. section 360bbb-3(b)(1), unless the authorization is terminated or revoked sooner.     Influenza A by PCR NEGATIVE NEGATIVE Final   Influenza B by PCR NEGATIVE NEGATIVE Final    Comment: (NOTE) The Xpert Xpress SARS-CoV-2/FLU/RSV plus assay is intended as an aid in the diagnosis of influenza from Nasopharyngeal swab specimens and should not be used as a sole basis for treatment. Nasal washings and aspirates are unacceptable  for Xpert Xpress SARS-CoV-2/FLU/RSV testing.  Fact Sheet for Patients: EntrepreneurPulse.com.au  Fact Sheet for Healthcare Providers: IncredibleEmployment.be  This test is not yet approved or cleared by the [place] FDA and has been authorized for detection and/or diagnosis of SARS-CoV-2 by FDA under an Emergency Use Authorization (EUA). This EUA will remain in effect (meaning this test can be used) for the duration of the COVID-19 declaration under Section 564(b)(1) of the Act, 21 U.S.C. section 360bbb-3(b)(1), unless the authorization is terminated or revoked.  Performed at [facility], [place] [address] [place], [place] [address]   Blood culture (routine x 2)     Status: None   Collection Time: 06/07/21 11:04 PM   Specimen: BLOOD  Result Value Ref Range Status   Specimen Description BLOOD RIGHT HAND  Final   Special Requests   Final    BOTTLES DRAWN AEROBIC AND ANAEROBIC Blood Culture results may not be optimal due to an inadequate volume of blood received in culture bottles   Culture   Final    NO GROWTH 5 DAYS Performed at [facility], [place]  [address], [place], [place] [address]    Report Status 06/12/2021 FINAL  Final  Blood culture (routine x 2)     Status: None (Preliminary result)   Collection Time: 06/08/21  6:01 AM   Specimen: BLOOD  Result Value Ref Range Status   Specimen Description BLOOD SITE NOT SPECIFIED  Final   Special Requests   Final    BOTTLES DRAWN AEROBIC ONLY Blood Culture results may not be optimal due to an inadequate volume of blood received in culture bottles   Culture   Final    NO GROWTH 4 DAYS Performed at [facility], [place] [address] [place], [place] [address]    Report Status PENDING  Incomplete       Radiology Studies: No results found.     LOS: 6 days   Anikka Marsan [employer] on [email or web address]  06/13/2021, 8:59 AM

## 2021-06-13 NOTE — Progress Notes (Signed)
Dressing change to left surgical site complete- old drainage noted.

## 2021-06-13 NOTE — Progress Notes (Signed)
Office Visit Note   Patient: Darryl Diaz           Date of Birth: 11-06-1940           MRN: [id number] Visit Date: 05/13/2021              Requested by: [name], MD [address] [place],  [place] [address] PCP: Osborne Casco, [name], MD  Chief Complaint  Patient presents with   Left Foot - Follow-up    Osteomyelitis great toe      HPI: Patient is a 81 year old gentleman who is seen in referral from [facility].  Patient complains of redness and swelling and ulceration plantar aspect of the left great toe.  He is not sure how long its been there he denies a history of diabetes.  Patient has end-stage renal disease on dialysis.  Assessment & Plan: Visit Diagnoses:  1. Pain in left foot   2. Osteomyelitis of great toe of left foot ([place])     Plan: Patient is given a prescription for doxycycline to [facility].  Recommended proceeding with a amputation of the great toe at the MTP joint.  Follow-Up Instructions: Return in about 2 weeks (around 05/27/2021).   Ortho Exam  Patient is alert, oriented, no adenopathy, well-dressed, normal affect, normal respiratory effort. Examination patient has a strong dorsalis pedis and posterior tibial pulse he has venous stasis insufficiency with dermatitis of the leg.  There is cellulitis of the forefoot.  The ulcer of the great toe probes to bone.  Patient currently lives at [facility] skilled nursing.  Imaging: No results found. No images are attached to the encounter.  Labs: Lab Results  Component Value Date   HGBA1C 4.4 (L) 09/07/2020   HGBA1C 4.7 (L) 02/28/2019   ESRSEDRATE 25 (H) 06/07/2021   CRP 3.2 (H) 06/10/2021   CRP 4.1 (H) 06/07/2021   REPTSTATUS PENDING 06/08/2021   GRAMSTAIN  01/20/2021    RARE WBC PRESENT,BOTH PMN AND MONONUCLEAR RARE GRAM POSITIVE COCCI IN PAIRS Performed at [facility], [place] [address] [place], [place] [address]    CULT  06/08/2021    NO GROWTH 4 DAYS Performed at  [facility] [address] [place],  [address]    LABORGA STAPHYLOCOCCUS AUREUS 01/20/2021     Lab Results  Component Value Date   ALBUMIN 2.2 (L) 06/11/2021   ALBUMIN 2.3 (L) 06/09/2021   ALBUMIN 2.6 (L) 06/08/2021    Lab Results  Component Value Date   MG 2.0 06/08/2021   MG 1.9 06/07/2021   MG 2.0 01/09/2021   No results found for: VD25OH  No results found for: PREALBUMIN CBC EXTENDED Latest Ref Rng & Units 06/11/2021 06/09/2021 06/08/2021  WBC 4.0 - 10.5 K/uL 4.7 4.2 5.6  RBC 4.22 - 5.81 MIL/uL 2.99(L) 3.07(L) 3.26(L)  HGB 13.0 - 17.0 g/dL 9.5(L) 9.4(L) 10.1(L)  HCT 39.0 - 52.0 % 28.9(L) 29.5(L) 31.4(L)  PLT 150 - 400 K/uL 145(L) 157 176  NEUTROABS 1.7 - 7.7 K/uL - - 4.4  LYMPHSABS 0.7 - 4.0 K/uL - - 0.6(L)     There is no height or weight on file to calculate BMI.  Orders:  Orders Placed This Encounter  Procedures   XR Foot Complete Left   No orders of the defined types were placed in this encounter.    Procedures: No procedures performed  Clinical Data: No additional findings.  ROS:  All other systems negative, except as noted in  the HPI. Review of Systems  Objective: Vital Signs: There were no vitals taken for this visit.  Specialty Comments:  No specialty comments available.  PMFS History: Patient Active Problem List   Diagnosis Date Noted   Frequent falls 06/07/2021   Generalized weakness 06/07/2021   Incidental finding of COVID-19 virus infection 06/07/2021   Dependence on renal dialysis ([place]) 05/25/2021   Non-pressure chronic ulcer of other part of left foot with unspecified severity ([place]) 05/25/2021   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[place]) 05/25/2021   Osteomyelitis of great toe of left foot ([place])    ESRD on dialysis ([place]) 04/13/2021   Cellulitis of right foot 01/17/2021   Normocytic anemia 01/17/2021   Acute lower UTI 01/17/2021   Acute GI bleeding 01/06/2021   Symptomatic anemia 12/02/2020   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   Orthostatic hypotension 11/23/2020   Acute renal insufficiency 10/23/2020   Alcohol abuse 10/23/2020   Hypokalemia 10/23/2020   Hypomagnesemia 10/23/2020   Low back pain at multiple sites 10/23/2020   Malnutrition of mild degree Altamease Oiler: 75% to less than 90% of standard weight) ([place]) 10/23/2020   Open wound of scalp without complication 46/27/0350   Oral candidiasis 10/23/2020   Cellulitis of left foot    Cellulitis of toe of left foot 10/13/2020   Skin ulcer of left great toe ([place]) 10/13/2020   CKD (chronic kidney disease) stage 4, 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/13/2020   Chronic heart failure with preserved ejection fraction (HFpEF) ([place]) 10/13/2020   Transient weakness of left lower extremity 09/07/2020   Heme positive stool 05/14/2020   Abnormality of gait 04/27/2020   Salmonella food poisoning 09/38/1829   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[place]) 03/17/2020   Yeast infection 03/16/2020   Diarrhea 03/16/2020   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 (HCC)    Allergies    Anemia, chronic disease    History of hypertension    Debility    Syncope and collapse 02/22/2020   AKI (acute kidney injury) ([place]) 02/10/2020   Unspecified protein-calorie malnutrition ([place]) 02/10/2020   Weakness 02/06/2020   History of repair of hip joint 10/15/2019   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   Pain in right foot 06/27/2019   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   Bradycardia 04/20/2019   Gastrointestinal hemorrhage 02/25/2019   Fall at home, initial encounter 02/25/2019   Bilateral lower extremity edema 02/25/2019   Hyponatremia 02/25/2019   Fall 02/25/2019   Benign prostatic hyperplasia with lower urinary tract symptoms 12/19/2018   Iron deficiency anemia due to chronic blood loss 08/11/2018   Anemia 08/11/2018   Hardening of the aorta (main artery of the heart) ([place]) 08/11/2018   Benign neoplasm of colon 06/18/2018    History of adenomatous polyp of colon 08/10/2017   AF (paroxysmal atrial fibrillation) ([place]) 02/05/2017   Impaired fasting glucose 06/10/2016   Encounter for general adult medical examination without abnormal findings 06/03/2016   Posterior dislocation of hip, closed ([place]) 04/30/2016   Hip fracture ([place]) 03/30/2016   Hypertension 03/30/2016   Hyperlipidemia 03/30/2016   Osteoarthritis 03/30/2016   Depressive disorder 03/30/2016   Rhabdomyolysis 03/30/2016   Leukocytosis 03/30/2016   Pressure ulcer 03/30/2016   Fracture of hip ([place]) 03/30/2016   Gastroesophageal reflux disease 12/17/2015   HTN (hypertension) 12/17/2015   Long term (current) use of anticoagulants 12/17/2015   Moderate major depression, single episode ([place]) 12/17/2015   Pure hypercholesterolemia 12/17/2015   Seasonal allergic rhinitis 12/17/2015  CAD (coronary artery disease) 08/21/2015   Past Medical History:  Diagnosis Date   Alcohol abuse 12/75/1700   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[place]) 03/17/2020   Alcoholism ([place])    in remission following wife's death   Anemia    Ascending aorta dilation (HCC)    Atrial fibrillation (HCC)    Cardiac arrest ([place]) 04/20/2019   12 min CPR with epi   Chronic diastolic CHF (congestive heart failure) ([place])    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[place])    Mon Wed Fri   Fatty liver    Gastritis    GERD (gastroesophageal reflux disease)    H/O seasonal allergies    History of blood transfusion    History of GI bleed    Hx of adenomatous colonic polyps 08/10/2017   Hyperlipidemia    Hypertension    Insomnia    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   Mallory-Weiss tear    OA (osteoarthritis)    hands   OSA (obstructive sleep apnea)    No longer uses CPAP   Persistent atrial fibrillation with rapid ventricular response ([place]) 02/05/2020   Recurrent syncope    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([place])     Family History  Problem Relation Age of Onset    Heart disease Mother 57   Hypertension Mother    Arthritis Mother    Heart failure Father 97   Stroke Maternal Aunt    Heart failure Sister    Colon cancer Neg Hx    Esophageal cancer Neg Hx    Stomach cancer Neg Hx    Rectal cancer Neg Hx     Past Surgical History:  Procedure Laterality Date   AMPUTATION Left 05/19/2021   Procedure: LEFT GREAT TOE AMPUTATION;  Surgeon: Newt Minion, MD;  Location: [facility];  Service: Orthopedics;  Laterality: Left;   ANKLE SURGERY Left 2014   had rods put in    AV FISTULA PLACEMENT Left 04/21/2021   Procedure: LEFT ARM ARTERIOVENOUS (AV) FISTULA CREATION - Left brachiocephalic fistula;  Surgeon: Marty Heck, MD;  Location: [facility];  Service: Vascular;  Laterality: Left;   BIOPSY  02/28/2019   Procedure: BIOPSY;  Surgeon: Milus Banister, MD;  Location: [facility];  Service: Endoscopy;;   CARDIOVERSION  02/06/2020   CARDIOVERSION N/A 02/06/2020   Procedure: CARDIOVERSION;  Surgeon: Werner Lean, MD;  Location: [facility]OSCOPY;  Service: Cardiovascular;  Laterality: N/A;   CARDIOVERSION N/A 02/24/2020   Procedure: CARDIOVERSION;  Surgeon: Werner Lean, MD;  Location: [facility];  Service: Cardiovascular;  Laterality: N/A;   COLONOSCOPY     [place]   COLONOSCOPY WITH PROPOFOL N/A 01/08/2021   Procedure: COLONOSCOPY WITH PROPOFOL;  Surgeon: Milus Banister, MD;  Location[facility] ENDOSCOPY;  Service: Endoscopy;  Laterality: N/A;   ESOPHAGOGASTRODUODENOSCOPY (EGD) WITH PROPOFOL N/A 02/28/2019   Procedure: ESOPHAGOGASTRODUODENOSCOPY (EGD) WITH PROPOFOL;  Surgeon: Milus Banister, MD;  Location: [facility] ENDOSCOPY;  Service: Endoscopy;  Laterality: N/A;   HIP ARTHROPLASTY Left 04/01/2016   Procedure: ARTHROPLASTY BIPOLAR HIP (HEMIARTHROPLASTY);  Surgeon: Paralee Cancel, MD;  Location: [facility];  Service: Orthopedics;  Laterality: Left;   HIP CLOSED REDUCTION Left 04/30/2016   Procedure: CLOSED REDUCTION HIP;  Surgeon: Wylene Simmer, MD;   Location: [facility];  Service: Orthopedics;  Laterality: Left;   HOT HEMOSTASIS N/A 01/08/2021   Procedure: HOT HEMOSTASIS (ARGON PLASMA COAGULATION/BICAP);  Surgeon: Milus Banister, MD;  Location: [facility] ENDOSCOPY;  Service: Endoscopy;  Laterality: N/A;   IR RADIOLOGIST EVAL & MGMT  12/31/2020   TONSILLECTOMY     TOTAL HIP ARTHROPLASTY Right 10/15/2019   Procedure: TOTAL HIP ARTHROPLASTY ANTERIOR APPROACH;  Surgeon: Paralee Cancel, MD;  Location: [facility];  Service: Orthopedics;  Laterality: Right;  70 mins   Social History   Occupational History   Occupation: retired  Tobacco Use   Smoking status: Former    Packs/day: 1.00    Years: 20.00    Pack years: 20.00    Types: Cigarettes   Smokeless tobacco: Never   Tobacco comments:       quit 20 years  smoked on and off for 20 years  Vaping Use   Vaping Use: Never used  Substance and Sexual Activity   Alcohol use: Not Currently    Alcohol/week: 3.0 standard drinks    Types: 3 Glasses of wine per week    Comment: h/o heavy use   Drug use: Never   Sexual activity: Not Currently

## 2021-06-13 NOTE — Progress Notes (Signed)
Patient ID: JAZEN SPRAGGINS, male   DOB: 12-23-1940, 81 y.o.   MRN: [id number]   Subjective:    Patient reports pain as none.  " My foot is better"  Objective: Vital signs in last 24 hours: Temp:  [97 F (36.1 C)-98.2 F (36.8 C)] 97.6 F (36.4 C) (02/05 0811) Pulse Rate:  [54-62] 62 (02/05 0811) Resp:  [14-22] 20 (02/05 0811) BP: (107-158)/(56-88) 121/67 (02/05 0811) SpO2:  [95 %-99 %] 98 % (02/05 0811) Weight:  [89 kg] 89 kg (02/05 0500)  Intake/Output from previous day: No intake/output data recorded. Intake/Output this shift: No intake/output data recorded.  Recent Labs    06/11/21 0657  HGB 9.5*   Recent Labs    06/11/21 0657  WBC 4.7  RBC 2.99*  HCT 28.9*  PLT 145*   Recent Labs    06/11/21 0657  NA 136  K 3.5  CL 102  CO2 24  BUN 30*  CREATININE 4.54*  GLUCOSE 89  CALCIUM 8.2*   No results for input(s): LABPT, INR in the last 72 hours.  Slight erythema.  No results found.  Assessment/Plan:    Plan:  home Monday  Marybelle Killings 06/13/2021, 11:00 AM

## 2021-06-13 NOTE — Progress Notes (Signed)
[facility] Progress Note   Subjective:  Seen in room - no new concerns. Breathing is ok. For HD here tomorrow morning (will be off COVID precautions at that time, so can be done in our HD unit), then d/c back to his SNF.  Objective Vitals:   06/13/21 0429 06/13/21 0500 06/13/21 0738 06/13/21 0811  BP: (!) 156/64   121/67  Pulse:    62  Resp: 17   20  Temp: (!) 97 F (36.1 C)   97.6 F (36.4 C)  TempSrc: Oral   Oral  SpO2: 97%  97% 98%  Weight:  89 kg    Height:       Physical Exam General: Well appearing man, NAD. Room air. Heart: RRR; no murmur Lungs: CTAB Abdomen: soft Extremities: Trace BLE edema (L>R), foot bandaged Dialysis Access: TDC + LUE AVF + thrill  Additional Objective Labs: Basic Metabolic Panel: Recent Labs  Lab 06/08/21 0253 06/09/21 2204 06/11/21 0657  NA 133* 135 136  K 4.7 3.5 3.5  CL 92* 100 102  CO2 24 23 24   GLUCOSE 47* 125* 89  BUN 23 23 30*  CREATININE 4.41* 4.23* 4.54*  CALCIUM 8.6* 8.2* 8.2*  PHOS 3.3 3.0 3.5   Liver Function Tests: Recent Labs  Lab 06/07/21 1530 06/08/21 0253 06/09/21 2204 06/11/21 0657  AST 30 29  --   --   ALT 15 14  --   --   ALKPHOS 106 111  --   --   BILITOT 0.8 0.9  --   --   PROT 5.4* 5.9*  --   --   ALBUMIN 2.5* 2.6* 2.3* 2.2*   CBC: Recent Labs  Lab 06/07/21 1530 06/08/21 0601 06/09/21 2204 06/11/21 0657  WBC 5.0 5.6 4.2 4.7  NEUTROABS 3.3 4.4  --   --   HGB 9.3* 10.1* 9.4* 9.5*  HCT 28.0* 31.4* 29.5* 28.9*  MCV 95.2 96.3 96.1 96.7  PLT 182 176 157 145*   Medications:  ceFEPime (MAXIPIME) IV 2 g (06/11/21 1832)   metronidazole 500 mg (06/13/21 0505)   vancomycin 750 mg (06/11/21 1223)    (feeding supplement) PROSource Plus  30 mL Oral BID BM   amiodarone  200 mg Oral Daily   atorvastatin  20 mg Oral Daily   carvedilol  6.25 mg Oral BID   Chlorhexidine Gluconate Cloth  6 each Topical Q0600   Chlorhexidine Gluconate Cloth  6 each Topical Q0600   heparin injection  (subcutaneous)  5,000 Units Subcutaneous Q8H   ipratropium-albuterol  3 mL Nebulization BID   loratadine  10 mg Oral Daily   pantoprazole  40 mg Oral Daily   vitamin B-12  500 mcg Oral Daily    Dialysis Orders: [facility] on MWF schedule 4 hours BFR 400 DFR Auto 1.5 EDW 90kg 2K, 2Ca, TDC, no heparin  - Mircera 267mcg IV q 2 weeks- last dose - Venofer 100mg  q HD, 1/23-2/13   Assessment/Plan:  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 On IV antibiotics. Management per primary team/ortho. Recurrent falls: Likely 2/2 osteomyelitis and pain medications. PT/OT consulted. No evidence of hypotension. COVID 19: Positive test on 06/03/21, s/p course molnupirvavir. Asymptomatic at this time.  ESRD: Continue usual MWF schedule - next HD 2/6 (planning AM HD prior to discharge).  Hypertension/volume: BP stable, on amiodarone and torsemide outpatient. Getting under EDW - likely has lost some weight, will lower on discharge.  Anemia: Hgb 9.5. Not  due for ESA. Will hold further IV iron until acute infection is resolved.  Metabolic bone disease: Ca/Phos good; no binders or VDRA  Nutrition: Alb low, continue supplements. Dispo: Back to SNF on 2/6 per notes.  Veneta Penton, PA-C 06/13/2021, 10:16 AM  [employer]

## 2021-06-14 DIAGNOSIS — M86272 Subacute osteomyelitis, left ankle and foot: Secondary | ICD-10-CM | POA: Diagnosis not present

## 2021-06-14 DIAGNOSIS — L309 Dermatitis, unspecified: Secondary | ICD-10-CM | POA: Diagnosis not present

## 2021-06-14 DIAGNOSIS — F39 Unspecified mood [affective] disorder: Secondary | ICD-10-CM | POA: Diagnosis not present

## 2021-06-14 DIAGNOSIS — Z7689 Persons encountering health services in other specified circumstances: Secondary | ICD-10-CM | POA: Diagnosis not present

## 2021-06-14 DIAGNOSIS — G4700 Insomnia, unspecified: Secondary | ICD-10-CM | POA: Diagnosis not present

## 2021-06-14 DIAGNOSIS — J302 Other seasonal allergic rhinitis: Secondary | ICD-10-CM | POA: Diagnosis not present

## 2021-06-14 DIAGNOSIS — Y929 Unspecified place or not applicable: Secondary | ICD-10-CM | POA: Diagnosis not present

## 2021-06-14 DIAGNOSIS — R278 Other lack of coordination: Secondary | ICD-10-CM | POA: Diagnosis not present

## 2021-06-14 DIAGNOSIS — N058 Unspecified nephritic syndrome with other morphologic changes: Secondary | ICD-10-CM | POA: Diagnosis not present

## 2021-06-14 DIAGNOSIS — I5042 Chronic combined systolic (congestive) and diastolic (congestive) heart failure: Secondary | ICD-10-CM | POA: Diagnosis not present

## 2021-06-14 DIAGNOSIS — N19 Unspecified kidney failure: Secondary | ICD-10-CM | POA: Diagnosis not present

## 2021-06-14 DIAGNOSIS — I120 Hypertensive chronic kidney disease with stage 5 chronic kidney disease or end stage renal disease: Secondary | ICD-10-CM | POA: Diagnosis not present

## 2021-06-14 DIAGNOSIS — Z89412 Acquired absence of left great toe: Secondary | ICD-10-CM | POA: Diagnosis not present

## 2021-06-14 DIAGNOSIS — I959 Hypotension, unspecified: Secondary | ICD-10-CM | POA: Diagnosis not present

## 2021-06-14 DIAGNOSIS — N250 Renal osteodystrophy: Secondary | ICD-10-CM | POA: Diagnosis not present

## 2021-06-14 DIAGNOSIS — S91302A Unspecified open wound, left foot, initial encounter: Secondary | ICD-10-CM | POA: Diagnosis not present

## 2021-06-14 DIAGNOSIS — Z8679 Personal history of other diseases of the circulatory system: Secondary | ICD-10-CM | POA: Diagnosis not present

## 2021-06-14 DIAGNOSIS — K922 Gastrointestinal hemorrhage, unspecified: Secondary | ICD-10-CM | POA: Diagnosis not present

## 2021-06-14 DIAGNOSIS — D509 Iron deficiency anemia, unspecified: Secondary | ICD-10-CM | POA: Diagnosis not present

## 2021-06-14 DIAGNOSIS — N2581 Secondary hyperparathyroidism of renal origin: Secondary | ICD-10-CM | POA: Diagnosis not present

## 2021-06-14 DIAGNOSIS — Z992 Dependence on renal dialysis: Secondary | ICD-10-CM | POA: Diagnosis not present

## 2021-06-14 DIAGNOSIS — I10 Essential (primary) hypertension: Secondary | ICD-10-CM | POA: Diagnosis not present

## 2021-06-14 DIAGNOSIS — F331 Major depressive disorder, recurrent, moderate: Secondary | ICD-10-CM | POA: Diagnosis not present

## 2021-06-14 DIAGNOSIS — M6281 Muscle weakness (generalized): Secondary | ICD-10-CM | POA: Diagnosis not present

## 2021-06-14 DIAGNOSIS — Z6825 Body mass index (BMI) 25.0-25.9, adult: Secondary | ICD-10-CM | POA: Diagnosis not present

## 2021-06-14 DIAGNOSIS — L03115 Cellulitis of right lower limb: Secondary | ICD-10-CM | POA: Diagnosis not present

## 2021-06-14 DIAGNOSIS — D649 Anemia, unspecified: Secondary | ICD-10-CM | POA: Diagnosis not present

## 2021-06-14 DIAGNOSIS — K219 Gastro-esophageal reflux disease without esophagitis: Secondary | ICD-10-CM | POA: Diagnosis not present

## 2021-06-14 DIAGNOSIS — Z7401 Bed confinement status: Secondary | ICD-10-CM | POA: Diagnosis not present

## 2021-06-14 DIAGNOSIS — J309 Allergic rhinitis, unspecified: Secondary | ICD-10-CM | POA: Diagnosis not present

## 2021-06-14 DIAGNOSIS — N184 Chronic kidney disease, stage 4 (severe): Secondary | ICD-10-CM | POA: Diagnosis not present

## 2021-06-14 DIAGNOSIS — E785 Hyperlipidemia, unspecified: Secondary | ICD-10-CM | POA: Diagnosis not present

## 2021-06-14 DIAGNOSIS — I129 Hypertensive chronic kidney disease with stage 1 through stage 4 chronic kidney disease, or unspecified chronic kidney disease: Secondary | ICD-10-CM | POA: Diagnosis not present

## 2021-06-14 DIAGNOSIS — R2689 Other abnormalities of gait and mobility: Secondary | ICD-10-CM | POA: Diagnosis not present

## 2021-06-14 DIAGNOSIS — M869 Osteomyelitis, unspecified: Secondary | ICD-10-CM | POA: Diagnosis not present

## 2021-06-14 DIAGNOSIS — T8249XA Other complication of vascular dialysis catheter, initial encounter: Secondary | ICD-10-CM | POA: Diagnosis not present

## 2021-06-14 DIAGNOSIS — D631 Anemia in chronic kidney disease: Secondary | ICD-10-CM | POA: Diagnosis not present

## 2021-06-14 DIAGNOSIS — N057 Unspecified nephritic syndrome with diffuse crescentic glomerulonephritis: Secondary | ICD-10-CM | POA: Diagnosis not present

## 2021-06-14 DIAGNOSIS — F411 Generalized anxiety disorder: Secondary | ICD-10-CM | POA: Diagnosis not present

## 2021-06-14 DIAGNOSIS — M86172 Other acute osteomyelitis, left ankle and foot: Secondary | ICD-10-CM | POA: Diagnosis not present

## 2021-06-14 DIAGNOSIS — N390 Urinary tract infection, site not specified: Secondary | ICD-10-CM | POA: Diagnosis not present

## 2021-06-14 DIAGNOSIS — N186 End stage renal disease: Secondary | ICD-10-CM | POA: Diagnosis not present

## 2021-06-14 DIAGNOSIS — W1830XA Fall on same level, unspecified, initial encounter: Secondary | ICD-10-CM | POA: Diagnosis not present

## 2021-06-14 DIAGNOSIS — I509 Heart failure, unspecified: Secondary | ICD-10-CM | POA: Diagnosis not present

## 2021-06-14 DIAGNOSIS — I5022 Chronic systolic (congestive) heart failure: Secondary | ICD-10-CM | POA: Diagnosis not present

## 2021-06-14 DIAGNOSIS — U071 COVID-19: Secondary | ICD-10-CM | POA: Diagnosis not present

## 2021-06-14 DIAGNOSIS — I951 Orthostatic hypotension: Secondary | ICD-10-CM | POA: Diagnosis not present

## 2021-06-14 DIAGNOSIS — Z20822 Contact with and (suspected) exposure to covid-19: Secondary | ICD-10-CM | POA: Diagnosis not present

## 2021-06-14 DIAGNOSIS — I482 Chronic atrial fibrillation, unspecified: Secondary | ICD-10-CM | POA: Diagnosis not present

## 2021-06-14 DIAGNOSIS — R296 Repeated falls: Secondary | ICD-10-CM | POA: Diagnosis not present

## 2021-06-14 DIAGNOSIS — Z23 Encounter for immunization: Secondary | ICD-10-CM | POA: Diagnosis not present

## 2021-06-14 MED ORDER — HYDROCODONE-ACETAMINOPHEN 5-325 MG PO TABS: 1.0000 | ORAL_TABLET | ORAL | 0 refills | Status: DC | PRN

## 2021-06-14 MED ORDER — HEPARIN SODIUM (PORCINE) 1000 UNIT/ML IJ SOLN
INTRAMUSCULAR | Status: AC
  Filled 2021-06-14: qty 4

## 2021-06-14 MED ORDER — ALBUTEROL SULFATE (2.5 MG/3ML) 0.083% IN NEBU: 2.5000 mg | INHALATION_SOLUTION | Freq: Four times a day (QID) | RESPIRATORY_TRACT | 12 refills | Status: DC | PRN

## 2021-06-14 NOTE — TOC Transition Note (Signed)
Transition of Care [facility]) - CM/SW Discharge Note   Patient Details  Name: Darryl Diaz MRN: [id number] Date of Birth: 17-[name]-1941  [facility] CM/SW Contact:  Joanne Chars, LCSW Phone Number: 06/14/2021, 1:27 PM   Clinical Narrative:   Pt discharging to [facility].  RN call [phone] for report.     Final next level of care: Skilled Nursing Facility Barriers to Discharge: Barriers Resolved   Patient Goals and CMS Choice        Discharge Placement              Patient chooses bed at:  [facility]) Patient to be transferred to facility by: [place] Name of family member notified: sister [name], and left message with son Quillian Quince Patient and family notified of of transfer: 06/14/21  Discharge Plan and Services   Discharge Planning Services: CM Consult                      HH Arranged: PT, OT, Nurse's Aide, Social Work, Engineer, materials Agency: Other - See comment ([place])        Social Determinants of Health (SDOH) Interventions     Readmission Risk Interventions Readmission Risk Prevention Plan 01/18/2021 03/18/2020 03/05/2020  Transportation Screening Complete Complete Complete  PCP or Specialist Appt within 5-7 Days - - -  Home Care Screening - - -  Medication Review (RN CM) - - -  [place] or [place] - Complete Complete  Social Work Consult for [place] Planning/Counseling - Complete Complete  Palliative Care Screening - Not Applicable Not Applicable  Medication Review Press photographer) Complete Complete Complete  PCP or Specialist appointment within 3-5 days of discharge Complete - -  [place] or Home Care Consult Complete - -  SW Recovery Care/Counseling Consult Complete - -  Palliative Care Screening Not Applicable - -  Skilled Nursing Facility Complete - -  Some recent data might be hidden

## 2021-06-14 NOTE — Progress Notes (Signed)
[facility] Progress Note   Subjective:   Patient seen and examined at bedside in dialysis.  Tolerating treatment well. No complaints.  Denies CP, SOB, abdominal pain, and n/v/d. Happy to discharge home today.   Objective Vitals:   06/14/21 0900 06/14/21 0930 06/14/21 1000 06/14/21 1030  BP: (!) 144/71 (!) 152/73 (!) 144/76 (!) 159/72  Pulse: (!) 55 (!) 54 61 60  Resp: 18 18 (!) 21 (!) 26  Temp:      TempSrc:      SpO2:      Weight:      Height:       Physical Exam General:well appearing male in NAD Heart:RRR, no mrg Lungs:CTAB, nml WOB Abdomen:soft, NTND Extremities:no LE edema Dialysis Access: TDC, LU AVF +b/t   Filed Weights   06/11/21 0014 06/11/21 0815 06/13/21 0500  Weight: 89.3 kg 89.3 kg 89 kg    Intake/Output Summary (Last 24 hours) at 06/14/2021 1046 Last data filed at 06/14/2021 0256 Gross per 24 hour  Intake 400 ml  Output --  Net 400 ml    Additional Objective Labs: Basic Metabolic Panel: Recent Labs  Lab 06/08/21 0253 06/09/21 2204 06/11/21 0657  NA 133* 135 136  K 4.7 3.5 3.5  CL 92* 100 102  CO2 24 23 24   GLUCOSE 47* 125* 89  BUN 23 23 30*  CREATININE 4.41* 4.23* 4.54*  CALCIUM 8.6* 8.2* 8.2*  PHOS 3.3 3.0 3.5   Liver Function Tests: Recent Labs  Lab 06/07/21 1530 06/08/21 0253 06/09/21 2204 06/11/21 0657  AST 30 29  --   --   ALT 15 14  --   --   ALKPHOS 106 111  --   --   BILITOT 0.8 0.9  --   --   PROT 5.4* 5.9*  --   --   ALBUMIN 2.5* 2.6* 2.3* 2.2*   No results for input(s): LIPASE, AMYLASE in the last 168 hours. CBC: Recent Labs  Lab 06/07/21 1530 06/08/21 0601 06/09/21 2204 06/11/21 0657  WBC 5.0 5.6 4.2 4.7  NEUTROABS 3.3 4.4  --   --   HGB 9.3* 10.1* 9.4* 9.5*  HCT 28.0* 31.4* 29.5* 28.9*  MCV 95.2 96.3 96.1 96.7  PLT 182 176 157 145*   Blood Culture    Component Value Date/Time   SDES BLOOD SITE NOT SPECIFIED 06/08/2021 0601   SPECREQUEST  06/08/2021 0601    BOTTLES DRAWN AEROBIC ONLY  Blood Culture results may not be optimal due to an inadequate volume of blood received in culture bottles   CULT  06/08/2021 0601    NO GROWTH 5 DAYS Performed at [facility] [address], [place], [place] [address]    REPTSTATUS 06/13/2021 FINAL 06/08/2021 0601    CBG: Recent Labs  Lab 06/07/21 1526 06/08/21 0611 06/08/21 0814 06/08/21 1210  GLUCAP 81 73 102* 110*    Medications:  ceFEPime (MAXIPIME) IV 2 g (06/11/21 1832)   metronidazole 500 mg (06/14/21 0546)   vancomycin 750 mg (06/14/21 1000)    (feeding supplement) PROSource Plus  30 mL Oral BID BM   amiodarone  200 mg Oral Daily   atorvastatin  20 mg Oral Daily   carvedilol  6.25 mg Oral BID   Chlorhexidine Gluconate Cloth  6 each Topical Q0600   heparin injection (subcutaneous)  5,000 Units Subcutaneous Q8H   heparin sodium (porcine)       ipratropium-albuterol  3 mL Nebulization BID   loratadine  10  mg Oral Daily   pantoprazole  40 mg Oral Daily   vitamin B-12  500 mcg Oral Daily    Dialysis Orders: [facility] on MWF schedule 4 hours BFR 400 DFR Auto 1.5 EDW 90kg 2K, 2Ca, TDC, no heparin  - Mircera 249mcg IV q 2 weeks- last dose - Venofer 100mg  q HD, 1/23-2/13   Assessment/Plan:  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 On IV antibiotics. Management per primary team/ortho. Recurrent falls: Likely 2/2 osteomyelitis and pain medications. PT/OT consulted. No evidence of hypotension. COVID 19: Positive test on 06/03/21, now off isolation.  s/p course molnupirvavir. Asymptomatic at this time.  ESRD: Continue usual MWF schedule - next HD today prior to d/c   Hypertension/volume: BP stable, on amiodarone and torsemide outpatient. Getting under EDW - likely has lost some weight, will lower on discharge ~89kg.  Anemia: last Hgb 9.5. Not due for ESA. Holding IV iron until acute infection is resolved.  Metabolic bone disease: Ca/Phos good; no binders or VDRA   Nutrition: Alb low, continue supplements.  Renal diet w/fluid restrictions.  Dispo: Back to SNF on 2/6 per notes.  Jen Mow, PA-C [facility] 06/14/2021,10:46 AM  LOS: 7 days

## 2021-06-14 NOTE — Progress Notes (Signed)
Pt to d/c to snf today. Contacted [facility] SW regarding pt's d/c today and pt to resume care on Wednesday.   Melven Sartorius Renal Navigator [phone]

## 2021-06-14 NOTE — Discharge Summary (Signed)
[facility]  Physician Discharge Summary   Patient ID: SHALAMAR CRAYS MRN: [id number] DOB/AGE: [birth year] 81 y.o.  Admit date: 06/07/2021 Discharge date:   06/14/2021   PCP: [name], MD  DISCHARGE DIAGNOSES:  Principal Problem:   Osteomyelitis of left foot [facility]) Active Problems:   Cellulitis of left foot   Incidental finding of COVID-19 virus infection   ESRD on dialysis ([place])   AF (paroxysmal atrial fibrillation) (HCC)   Anemia, chronic disease   HTN (hypertension)   Frequent falls   Generalized weakness   RECOMMENDATIONS FOR OUTPATIENT FOLLOW UP: Patient to keep up with his usual hemodialysis schedule on Monday Wednesday Friday Patient to follow-up with Dr. Sharol Given in 1 week Infectious disease will arrange outpatient follow-up Patient to get antibiotics with hemodialysis    Home Health: SNF Equipment/Devices: None  CODE STATUS: DNR  DISCHARGE CONDITION: fair  Diet recommendation: Renal diet  INITIAL HISTORY: JERMON CHALFANT is a 81 y.o. male with medical history significant for ESRD on HD MWF (still makes urine), paroxysmal atrial fibrillation not on anticoagulation, hypertension, anemia of chronic kidney disease with associated baseline hemoglobin 8-11K, s/p left great toe amputation, who is admitted to [facility] on 06/07/2021 with concern for osteomyelitis of the left second toe after presenting from home to [facility] ED complaining of balance issues and frequent falls.  Covid-19 screening test positive on 06/03/21, started on antiviral Molnupiravir on admission.  Was placed on broad-spectrum antibiotics for cellulitis and left foot osteomyelitis.  Seen by orthopedic surgery.  No need for surgical intervention at this time.  Medically stable.  Cannot return to his nursing facility till 06/14/2021 due to [place].    Consultations: Orthopedics Infectious disease Nephrology   Procedures: Hemodialysis   HOSPITAL COURSE:   * Osteomyelitis of left  foot ([place])- (present on admission) See under cellulitis.  Cellulitis of left foot- (present on admission) Second toe proximal phalanx and mid to distal second metatarsal osteomyelitis seen on left foot MRI  He is s/p left great toe amputation during previous hospitalization. Follows with Dr. Sharol Given outpatient. Patient underwent MRI.  Was seen by Dr. Sharol Given.  Also seen by infectious disease due to concern for osteomyelitis.  No surgical intervention is planned.  Blood cultures have been negative. Patient was placed on vancomycin cefepime and metronidazole. Cellulitis has improved. Plan is to treat with vancomycin cefepime and oral metronidazole for 6 weeks.  He will get the vancomycin and cefepime with his hemodialysis 3 days a week.  Patient seen by physical therapy.    Patient to return to his facility but will need to go to the skilled nursing level of care rather than independent level.  Incidental finding of COVID-19 virus infection- (present on admission) Incidentally positive on 06/03/2021. Treated with Molnupiravir.  Asymptomatic for the most part.  Saturating normal on room air. Patient has completed 10 days of isolation.  ESRD on dialysis [facility]) He is on a Monday Wednesday Friday schedule.    AF (paroxysmal atrial fibrillation) ([place])- (present on admission) Patient not on anticoagulation due to history of GI bleed.  Plus has had frequent falls.  He is noted to be on carvedilol and amiodarone which is being continued.  Anemia, chronic disease- (present on admission) Hemoglobin low but stable.  No evidence for any overt blood loss.  HTN (hypertension)- (present on admission) Blood pressure stable for the most part.  Occasional high readings noted.  He is on carvedilol alone.  Continue to monitor for now.  Will not be too aggressive with blood pressure control since he is on dialysis.  Frequent falls Seen by PT and OT.  Skilled nursing facility.   Patient is stable.  Okay for  discharge back to SNF.  Has completed 10 days of isolation for COVID-19.   PERTINENT LABS:  The results of significant diagnostics from this hospitalization (including imaging, microbiology, ancillary and laboratory) are listed below for reference.    Microbiology: Recent Results (from the past 240 hour(s))  Blood culture (routine x 2)     Status: None   Collection Time: 06/07/21 11:04 PM   Specimen: BLOOD  Result Value Ref Range Status   Specimen Description BLOOD RIGHT HAND  Final   Special Requests   Final    BOTTLES DRAWN AEROBIC AND ANAEROBIC Blood Culture results may not be optimal due to an inadequate volume of blood received in culture bottles   Culture   Final    NO GROWTH 5 DAYS Performed at [facility], [place] [address], [place], [place] [address]    Report Status 06/12/2021 FINAL  Final  Blood culture (routine x 2)     Status: None   Collection Time: 06/08/21  6:01 AM   Specimen: BLOOD  Result Value Ref Range Status   Specimen Description BLOOD SITE NOT SPECIFIED  Final   Special Requests   Final    BOTTLES DRAWN AEROBIC ONLY Blood Culture results may not be optimal due to an inadequate volume of blood received in culture bottles   Culture   Final    NO GROWTH 5 DAYS Performed at [facility], [place] [address], [place], [place] [address]    Report Status 06/13/2021 FINAL  Final     Labs:  COVID-19 Labs   Lab Results  Component Value Date   SARSCOV2NAA POSITIVE (A) 06/03/2021   [place] NEGATIVE 05/01/2021   [place] NEGATIVE 01/20/2021   [place] NEGATIVE 01/17/2021      Basic Metabolic Panel: Recent Labs  Lab 06/07/21 1530 06/08/21 0253 06/09/21 2204 06/11/21 0657  NA 130* 133* 135 136  K 4.7 4.7 3.5 3.5  CL 93* 92* 100 102  CO2 24 24 23 24   GLUCOSE 75 47* 125* 89  BUN 20 23 23  30*  CREATININE 4.25* 4.41* 4.23* 4.54*  CALCIUM 8.5* 8.6* 8.2* 8.2*  MG 1.9 2.0  --   --   PHOS  --  3.3 3.0 3.5   Liver Function  Tests: Recent Labs  Lab 06/07/21 1530 06/08/21 0253 06/09/21 2204 06/11/21 0657  AST 30 29  --   --   ALT 15 14  --   --   ALKPHOS 106 111  --   --   BILITOT 0.8 0.9  --   --   PROT 5.4* 5.9*  --   --   ALBUMIN 2.5* 2.6* 2.3* 2.2*   CBC: Recent Labs  Lab 06/07/21 1530 06/08/21 0601 06/09/21 2204 06/11/21 0657  WBC 5.0 5.6 4.2 4.7  NEUTROABS 3.3 4.4  --   --   HGB 9.3* 10.1* 9.4* 9.5*  HCT 28.0* 31.4* 29.5* 28.9*  MCV 95.2 96.3 96.1 96.7  PLT 182 176 157 145*    CBG: Recent Labs  Lab 06/07/21 1526 06/08/21 0611 06/08/21 0814 06/08/21 1210  GLUCAP 81 73 102* 110*     IMAGING STUDIES CT Head Wo Contrast  Result Date: 06/07/2021 CLINICAL DATA:  [place] today.  Possibly hit his head. EXAM: CT HEAD WITHOUT CONTRAST CT  CERVICAL SPINE WITHOUT CONTRAST TECHNIQUE: Multidetector CT imaging of the head and cervical spine was performed following the standard protocol without intravenous contrast. Multiplanar CT image reconstructions of the cervical spine were also generated. RADIATION DOSE REDUCTION: This exam was performed according to the departmental dose-optimization program which includes automated exposure control, adjustment of the mA and/or kV according to patient size and/or use of iterative reconstruction technique. COMPARISON:  Head CT dated 06/03/2021. Head and cervical spine CT dated 05/01/2021. FINDINGS: CT HEAD FINDINGS Brain: No significant change in moderate to marked diffuse enlargement of the ventricles and subarachnoid spaces. Mild-to-moderate patchy white matter low density in both cerebral hemispheres without significant change. No intracranial hemorrhage, mass lesion or CT evidence of acute infarction. Vascular: No hyperdense vessel or unexpected calcification. Atheromatous arterial calcifications at the skull base. Skull: Normal. Negative for fracture or focal lesion. Sinuses/Orbits: Status post right cataract extraction. Bilateral anterior ethmoid sinus mucosal  thickening. Mild left maxillary sinus mucosal thickening. Other: None. CT CERVICAL SPINE FINDINGS Alignment: Stable reversal of the normal cervical lordosis. No subluxations. Skull base and vertebrae: No acute fracture. No primary bone lesion or focal pathologic process. Soft tissues and spinal canal: No prevertebral fluid or swelling. No visible canal hematoma. Disc levels:  Stable multilevel degenerative changes. Upper chest: Clear lung apices. Other: Bilateral carotid artery calcifications. IMPRESSION: 1. No skull fracture or intracranial hemorrhage. 2. No cervical spine fracture or subluxation. 3. Stable diffuse cerebral and cerebellar atrophy in chronic small vessel white matter ischemic changes. 4. Stable reversal of the normal cervical lordosis and multilevel cervical spine degenerative changes. 5. Bilateral carotid artery atheromatous calcifications. Electronically Signed   By: Claudie Revering M.D.   On: 06/07/2021 16:38    CT Cervical Spine Wo Contrast  Result Date: 06/07/2021 CLINICAL DATA:  [place] today.  Possibly hit his head. EXAM: CT HEAD WITHOUT CONTRAST CT CERVICAL SPINE WITHOUT CONTRAST TECHNIQUE: Multidetector CT imaging of the head and cervical spine was performed following the standard protocol without intravenous contrast. Multiplanar CT image reconstructions of the cervical spine were also generated. RADIATION DOSE REDUCTION: This exam was performed according to the departmental dose-optimization program which includes automated exposure control, adjustment of the mA and/or kV according to patient size and/or use of iterative reconstruction technique. COMPARISON:  Head CT dated 06/03/2021. Head and cervical spine CT dated 05/01/2021. FINDINGS: CT HEAD FINDINGS Brain: No significant change in moderate to marked diffuse enlargement of the ventricles and subarachnoid spaces. Mild-to-moderate patchy white matter low density in both cerebral hemispheres without significant change. No intracranial  hemorrhage, mass lesion or CT evidence of acute infarction. Vascular: No hyperdense vessel or unexpected calcification. Atheromatous arterial calcifications at the skull base. Skull: Normal. Negative for fracture or focal lesion. Sinuses/Orbits: Status post right cataract extraction. Bilateral anterior ethmoid sinus mucosal thickening. Mild left maxillary sinus mucosal thickening. Other: None. CT CERVICAL SPINE FINDINGS Alignment: Stable reversal of the normal cervical lordosis. No subluxations. Skull base and vertebrae: No acute fracture. No primary bone lesion or focal pathologic process. Soft tissues and spinal canal: No prevertebral fluid or swelling. No visible canal hematoma. Disc levels:  Stable multilevel degenerative changes. Upper chest: Clear lung apices. Other: Bilateral carotid artery calcifications. IMPRESSION: 1. No skull fracture or intracranial hemorrhage. 2. No cervical spine fracture or subluxation. 3. Stable diffuse cerebral and cerebellar atrophy in chronic small vessel white matter ischemic changes. 4. Stable reversal of the normal cervical lordosis and multilevel cervical spine degenerative changes. 5. Bilateral carotid artery atheromatous calcifications. Electronically Signed  By: Claudie Revering M.D.   On: 06/07/2021 16:38   MR FOOT LEFT WO CONTRAST  Result Date: 06/08/2021 CLINICAL DATA:  Osteomyelitis, foot EXAM: MRI OF THE LEFT FOOT WITHOUT CONTRAST TECHNIQUE: Multiplanar, multisequence MR imaging of the left forefoot was performed. No intravenous contrast was administered. COMPARISON:  Left foot radiograph 06/07/2021 FINDINGS: Bones/Joint/Cartilage Prior great toe amputation. There is bony edema and confluent low T1 signal within the second toe proximal phalanx and mid to distal second metatarsal. The second toe proximal phalanx is dorsally dislocated from the second metatarsal head. There is edema signal within the first and third metatarsal heads with preserved T1 signal. Similar  findings but to a lesser degree in the fourth metatarsal head. There is a subacute fracture of the third metatarsal head/neck with mild angulation. Ligaments Surgically disrupted first MTP ligaments. Torn second MTP plantar plate and collateral ligaments. The third through fifth MTP collateral ligaments appear intact without evidence of plantar plate tear. The Lisfranc ligament appears intact. Muscles and Tendons Diffuse intramuscular edema and muscle atrophy in the foot as is commonly seen in diabetics. Soft tissues Diffuse soft tissue swelling of the foot. There is some localized fluid in the first webspace between the first and second metatarsal heads, not well-defined. 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 Dorsal dislocation of the second MTP joint. Bony edema in the adjacent first and third metatarsal heads which is favored to be reactive, though early osteomyelitis could have a similar appearance. Subacute third metatarsal head/neck fracture with mild angulation, accounting for some of the aforementioned bony edema. Localized fluid in the first webspace between the first and second metatarsal heads, not well-defined but could represent developing phlegmon/abscess. Reactive mild edema in the fourth metatarsal head. Electronically Signed   By: Maurine Simmering M.D.   On: 06/08/2021 09:28   DG Chest Portable 1 View  Result Date: 06/07/2021 CLINICAL DATA:  Concern for pneumonia EXAM: PORTABLE CHEST 1 VIEW COMPARISON:  Chest x-ray 06/03/2021 FINDINGS: Cardiomegaly and mediastinum are stable. Pulmonary vasculature appears within normal limits. No focal consolidation identified. Prominent chronic interstitial lung markings similar to previous. No significant pleural effusion visualized. No pneumothorax. IMPRESSION: Cardiomegaly with no acute process identified. Electronically Signed   By: Ofilia Neas M.D.   On: 06/07/2021 16:12   DG Foot Complete  Left  Result Date: 06/07/2021 CLINICAL DATA:  Left great toe amputation, EXAM: LEFT FOOT - COMPLETE 3+ VIEW COMPARISON:  05/13/2021 FINDINGS: Frontal, oblique, lateral views of the left foot are obtained. Prior amputation at the level of the first metatarsophalangeal joint. There is diffuse soft tissue swelling throughout the forefoot and remaining digits, as well as at the surgical site. On the lateral view, there is evidence of bony destruction at the base of the second proximal phalanx concerning for osteomyelitis. No other acute or destructive bony lesions. Stable osteoarthritis. Stable postsurgical changes of the left ankle. IMPRESSION: 1. Interval amputation at the first metatarsophalangeal joint. 2. Bony destruction at the base of the second proximal phalanx, best seen on lateral view, concerning for osteomyelitis. 3. Progressive soft tissue swelling throughout the left forefoot and remaining digits. Electronically Signed   By: Randa Ngo M.D.   On: 06/07/2021 19:20    DISCHARGE EXAMINATION: Vitals:   06/14/21 1100 06/14/21 1115 06/14/21 1128 06/14/21 1130  BP: (!) 146/79  (!) 146/72 (!) 155/78  Pulse: 60 63  (!) 58  Resp: (!) 24 20  (!) 22  Temp:  98.2 F (36.8 C)  TempSrc:    Oral  SpO2: 98% 99%  99%  Weight:    85.4 kg  Height:       General appearance: Awake alert.  In no distress Resp: Clear to auscultation bilaterally.  Normal effort Cardio: S1-S2 is normal regular.  No S3-S4.  No rubs murmurs or bruit GI: Abdomen is soft.  Nontender nondistended.  Bowel sounds are present normal.  No masses organomegaly   DISPOSITION: SNF  Discharge Instructions     Call MD for:  difficulty breathing, headache or visual disturbances   Complete by: As directed    Call MD for:  extreme fatigue   Complete by: As directed    Call MD for:  persistant dizziness or light-headedness   Complete by: As directed    Call MD for:  persistant nausea and vomiting   Complete by: As directed     Call MD for:  severe uncontrolled pain   Complete by: As directed    Call MD for:  temperature >100.4   Complete by: As directed    Diet - low sodium heart healthy   Complete by: As directed    Discharge instructions   Complete by: As directed    Please take your medications as prescribed.  Please keep your appointments for hemodialysis as per your usual schedule.  Seek attention if the redness in your left foot worsens.  You will need to follow-up with Dr. Jess Barters office.  You were cared for by a hospitalist during your hospital stay. If you have any questions about your discharge medications or the care you received while you were in the hospital after you are discharged, you can call the unit and asked to speak with the hospitalist on call if the hospitalist that took care of you is not available. Once you are discharged, your primary care physician will handle any further medical issues. Please note that NO REFILLS for any discharge medications will be authorized once you are discharged, as it is imperative that you return to your primary care physician (or establish a relationship with a primary care physician if you do not have one) for your aftercare needs so that they can reassess your need for medications and monitor your lab values. If you do not have a primary care physician, you can call [phone] for a physician referral.   Increase activity slowly   Complete by: As directed    No wound care   Complete by: As directed         Allergies as of 06/14/2021       Reactions   Penicillins Rash        Medication List     STOP taking these medications    doxycycline 100 MG tablet Commonly known as: VIBRA-TABS   ketoconazole 2 % cream Commonly known as: NIZORAL   molnupiravir EUA 200 mg Caps capsule Commonly known as: LAGEVRIO   nystatin powder Commonly known as: MYCOSTATIN/NYSTOP   potassium chloride SA 20 MEQ tablet Commonly known as: KLOR-CON M       TAKE these  medications    acetaminophen 325 MG tablet Commonly known as: TYLENOL Take 2 tablets (650 mg total) by mouth every 4 (four) hours as needed for headache or mild pain. What changed:  how much to take reasons to take this   albuterol (2.5 MG/3ML) 0.083% nebulizer solution Commonly known as: PROVENTIL Take 3 mLs (2.5 mg total) by nebulization every 6 (six) hours as  needed for wheezing or shortness of breath.   amiodarone 200 MG tablet Commonly known as: PACERONE TAKE 1 TABLET BY MOUTH ONCE DAILY What changed: when to take this   atorvastatin 20 MG tablet Commonly known as: LIPITOR Take 1 tablet (20 mg total) by mouth daily. What changed: when to take this   carvedilol 6.25 MG tablet Commonly known as: COREG Take 1 tablet (6.25 mg total) by mouth 2 (two) times daily.   ceFEPIme 2 g in sodium chloride 0.9 % 100 mL Inject 2 g into the vein every Monday, Wednesday, and Friday at 6 PM.   Centrum Silver 50+Men Tabs Take 1 tablet by mouth every other day.   cetirizine 10 MG tablet Commonly known as: ZYRTEC Take 10 mg by mouth daily.   escitalopram 10 MG tablet Commonly known as: LEXAPRO Take 1 tablet (10 mg total) by mouth daily. What changed: when to take this   ferrous sulfate 324 MG Tbec Take 324 mg by mouth every morning.   FOLIC ACID PO Take 1 tablet by mouth every morning.   HYDROcodone-acetaminophen 5-325 MG tablet Commonly known as: NORCO/VICODIN Take 1 tablet by mouth every 4 (four) hours as needed (pain).   Magnesium Oxide 400 MG Caps Take 1 capsule (400 mg total) by mouth daily.   metroNIDAZOLE 500 MG tablet Commonly known as: Flagyl Take 1 tablet (500 mg total) by mouth 2 (two) times daily.   nitroGLYCERIN 0.4 MG SL tablet Commonly known as: NITROSTAT Place 1 tablet (0.4 mg total) under the tongue every 5 (five) minutes x 3 doses as needed for chest pain.   pantoprazole 40 MG tablet Commonly known as: PROTONIX Take 1 tablet (40 mg total) by mouth  daily. What changed: when to take this   sodium bicarbonate 650 MG tablet Take 1 tablet (650 mg total) by mouth 2 (two) times daily.   tamsulosin 0.4 MG Caps capsule Commonly known as: FLOMAX Take 0.4 mg by mouth at bedtime as needed (frequent urination).   torsemide 20 MG tablet Commonly known as: DEMADEX Take 60 mg by mouth See admin instructions. Take 3 tablets (60 mg) by mouth every morning, may also take 3 tablets (60 mg) in the afternoon as needed for swelling What changed: Another medication with the same name was removed. Continue taking this medication, and follow the directions you see here.   traZODone 50 MG tablet Commonly known as: DESYREL Take 1 tablet (50 mg total) by mouth at bedtime. What changed:  when to take this reasons to take this   vancomycin 750 MG/150ML Soln Commonly known as: VANCOREADY Inject 150 mLs (750 mg total) into the vein every Monday, Wednesday, and Friday with hemodialysis.   vitamin B-12 500 MCG tablet Commonly known as: CYANOCOBALAMIN Take 1 tablet (500 mcg total) by mouth daily.          Follow-up Information     Tisovec, [name], MD .   Specialty: Internal Medicine Contact information: [place] [address] [facility] Follow up.   Why: Resumption of your home health services will be continued with [facility], start of care with 48 hours post discharge Contact information: [phone]        Newt Minion, MD Follow up in 1 week(s).   Specialty: Orthopedic Surgery Contact information: [address] [place]  [address] [phone]  TOTAL DISCHARGE TIME: 35 minutes  Guinn Delarosa [employer] on [email or web address]  06/14/2021, 12:39 PM

## 2021-06-15 ENCOUNTER — Other Ambulatory Visit: Payer: Self-pay | Admitting: *Deleted

## 2021-06-15 DIAGNOSIS — M869 Osteomyelitis, unspecified: Secondary | ICD-10-CM | POA: Diagnosis not present

## 2021-06-15 DIAGNOSIS — I5022 Chronic systolic (congestive) heart failure: Secondary | ICD-10-CM | POA: Diagnosis not present

## 2021-06-15 DIAGNOSIS — N184 Chronic kidney disease, stage 4 (severe): Secondary | ICD-10-CM | POA: Diagnosis not present

## 2021-06-15 DIAGNOSIS — I10 Essential (primary) hypertension: Secondary | ICD-10-CM | POA: Diagnosis not present

## 2021-06-15 DIAGNOSIS — D649 Anemia, unspecified: Secondary | ICD-10-CM | POA: Diagnosis not present

## 2021-06-15 DIAGNOSIS — S91302A Unspecified open wound, left foot, initial encounter: Secondary | ICD-10-CM | POA: Diagnosis not present

## 2021-06-15 NOTE — Patient Outreach (Signed)
Per [facility] eligible member resides in  [facility].  Screened for potential [facility] Care Management services as a benefit of member's insurance plan.  Mr. Malinowski admitted to SNF on 06/14/21 after hospitalization.  Communication sent to facility SW to make aware writer is following for transition plans and [facility] needs.  Will collaborate with facility SW and follow up with resident as appropriate.   [name], MSN, RN,BSN [place] Acute Care Coordinator [phone] [facility]) [phone]  (Toll free office)

## 2021-06-16 DIAGNOSIS — N186 End stage renal disease: Secondary | ICD-10-CM | POA: Diagnosis not present

## 2021-06-16 DIAGNOSIS — Z23 Encounter for immunization: Secondary | ICD-10-CM | POA: Diagnosis not present

## 2021-06-16 DIAGNOSIS — N2581 Secondary hyperparathyroidism of renal origin: Secondary | ICD-10-CM | POA: Diagnosis not present

## 2021-06-16 DIAGNOSIS — L03115 Cellulitis of right lower limb: Secondary | ICD-10-CM | POA: Diagnosis not present

## 2021-06-16 DIAGNOSIS — Z992 Dependence on renal dialysis: Secondary | ICD-10-CM | POA: Diagnosis not present

## 2021-06-17 ENCOUNTER — Other Ambulatory Visit: Payer: Self-pay | Admitting: *Deleted

## 2021-06-17 NOTE — Patient Outreach (Signed)
[facility] Post- Acute Care Coordinator follow up. Per [facility] eligible member resides in  [facility].  Screened for potential [facility] Care Management services as a benefit of member's insurance plan. ° °Facility site visit to  [facility] skilled nursing facility. Met with Kelly, Admissions Coordinator who states care plan meeting is scheduled for next Tuesday. Will know more regarding transition plans at that time. Darryl Diaz is from [facility]. It does not appear Darryl Diaz will have any [facility] Care Management needs if he remains at [facility] in any capacity.   ° °Darryl Diaz was off the unit with therapy when writer went to bedside.  ° °Will confirm transition plans before signing off. Will continue to follow. ° ° ° , MSN, RN,BSN °[facility] Post Acute Care Coordinator °[phone] ( [employer]) °[phone]  (Toll free office)  ° ° ° ° ° ° ° ° ° ° °  °

## 2021-06-18 DIAGNOSIS — Z992 Dependence on renal dialysis: Secondary | ICD-10-CM | POA: Diagnosis not present

## 2021-06-18 DIAGNOSIS — N2581 Secondary hyperparathyroidism of renal origin: Secondary | ICD-10-CM | POA: Diagnosis not present

## 2021-06-18 DIAGNOSIS — Z23 Encounter for immunization: Secondary | ICD-10-CM | POA: Diagnosis not present

## 2021-06-18 DIAGNOSIS — L03115 Cellulitis of right lower limb: Secondary | ICD-10-CM | POA: Diagnosis not present

## 2021-06-18 DIAGNOSIS — N186 End stage renal disease: Secondary | ICD-10-CM | POA: Diagnosis not present

## 2021-06-21 DIAGNOSIS — L03115 Cellulitis of right lower limb: Secondary | ICD-10-CM | POA: Diagnosis not present

## 2021-06-21 DIAGNOSIS — N186 End stage renal disease: Secondary | ICD-10-CM | POA: Diagnosis not present

## 2021-06-21 DIAGNOSIS — Z23 Encounter for immunization: Secondary | ICD-10-CM | POA: Diagnosis not present

## 2021-06-21 DIAGNOSIS — Z992 Dependence on renal dialysis: Secondary | ICD-10-CM | POA: Diagnosis not present

## 2021-06-21 DIAGNOSIS — N2581 Secondary hyperparathyroidism of renal origin: Secondary | ICD-10-CM | POA: Diagnosis not present

## 2021-06-22 ENCOUNTER — Telehealth: Payer: Self-pay

## 2021-06-22 NOTE — Telephone Encounter (Signed)
Patient would like a call back from Dr. Sharol Given or Junie Panning to discuss his left GRT Toe moving forward.  Stated that everything is good.  CB# [phone].  Please advise.  Thank you.

## 2021-06-23 DIAGNOSIS — N186 End stage renal disease: Secondary | ICD-10-CM | POA: Diagnosis not present

## 2021-06-23 DIAGNOSIS — Z992 Dependence on renal dialysis: Secondary | ICD-10-CM | POA: Diagnosis not present

## 2021-06-23 DIAGNOSIS — Z23 Encounter for immunization: Secondary | ICD-10-CM | POA: Diagnosis not present

## 2021-06-23 DIAGNOSIS — L03115 Cellulitis of right lower limb: Secondary | ICD-10-CM | POA: Diagnosis not present

## 2021-06-23 DIAGNOSIS — N2581 Secondary hyperparathyroidism of renal origin: Secondary | ICD-10-CM | POA: Diagnosis not present

## 2021-06-23 NOTE — Telephone Encounter (Signed)
SW pt, he is at [facility] under nursing supervision. He is discharged to home, which is at [facility] in independent living on Wednesday next week. He has dialysis that same day so he was asking to be able to leave one day early on Tuesday. He has been doing really well, staying off his foot while ambulating. I told him that we have not seen him since his surgery on 05/19/21 and he didn't know he needed to make a f/u apt. I did schedule this apt on Monday 06/28/21 as that was best option for him. He wasn't sure if he had sutures still in or not from left great toe amputation.

## 2021-06-24 DIAGNOSIS — F411 Generalized anxiety disorder: Secondary | ICD-10-CM | POA: Diagnosis not present

## 2021-06-25 DIAGNOSIS — L03115 Cellulitis of right lower limb: Secondary | ICD-10-CM | POA: Diagnosis not present

## 2021-06-25 DIAGNOSIS — Z992 Dependence on renal dialysis: Secondary | ICD-10-CM | POA: Diagnosis not present

## 2021-06-25 DIAGNOSIS — N186 End stage renal disease: Secondary | ICD-10-CM | POA: Diagnosis not present

## 2021-06-25 DIAGNOSIS — N2581 Secondary hyperparathyroidism of renal origin: Secondary | ICD-10-CM | POA: Diagnosis not present

## 2021-06-25 DIAGNOSIS — Z23 Encounter for immunization: Secondary | ICD-10-CM | POA: Diagnosis not present

## 2021-06-28 ENCOUNTER — Telehealth: Payer: Self-pay | Admitting: Orthopedic Surgery

## 2021-06-28 ENCOUNTER — Encounter: Payer: Medicare Other | Admitting: Orthopedic Surgery

## 2021-06-28 DIAGNOSIS — L03115 Cellulitis of right lower limb: Secondary | ICD-10-CM | POA: Diagnosis not present

## 2021-06-28 DIAGNOSIS — N186 End stage renal disease: Secondary | ICD-10-CM | POA: Diagnosis not present

## 2021-06-28 DIAGNOSIS — Z992 Dependence on renal dialysis: Secondary | ICD-10-CM | POA: Diagnosis not present

## 2021-06-28 DIAGNOSIS — N2581 Secondary hyperparathyroidism of renal origin: Secondary | ICD-10-CM | POA: Diagnosis not present

## 2021-06-28 DIAGNOSIS — Z23 Encounter for immunization: Secondary | ICD-10-CM | POA: Diagnosis not present

## 2021-06-28 DIAGNOSIS — Z20822 Contact with and (suspected) exposure to covid-19: Secondary | ICD-10-CM | POA: Diagnosis not present

## 2021-06-28 NOTE — Telephone Encounter (Signed)
Darryl Diaz called in with questions about his recovery.  He surgery on his Left GT a few weeks ago and was told not be put any weight on his foot.  He is moving back in to his [employer] apartment (from the rehab/nursing site) and would like to know if he can put weight on his heel? He will be going to dialysis at 10:30am this morning and will return around 4:15pm, you can call back then at this [facility] # [phone].

## 2021-06-28 NOTE — Telephone Encounter (Signed)
Pt is supposed to see [place] this morning at 9:45am for a post op apt. He needs to have one asap as he has not f/u with [place] in office for post op.

## 2021-06-29 NOTE — Telephone Encounter (Signed)
Call made to number provided below and rings several times, this disconnects.

## 2021-06-30 DIAGNOSIS — T8249XA Other complication of vascular dialysis catheter, initial encounter: Secondary | ICD-10-CM | POA: Diagnosis not present

## 2021-06-30 DIAGNOSIS — N186 End stage renal disease: Secondary | ICD-10-CM | POA: Diagnosis not present

## 2021-06-30 DIAGNOSIS — Z992 Dependence on renal dialysis: Secondary | ICD-10-CM | POA: Diagnosis not present

## 2021-06-30 NOTE — Telephone Encounter (Signed)
Called pt's number and no answer. Prompt comes up but unable to leave VM

## 2021-07-01 DIAGNOSIS — N2581 Secondary hyperparathyroidism of renal origin: Secondary | ICD-10-CM | POA: Diagnosis not present

## 2021-07-01 DIAGNOSIS — N186 End stage renal disease: Secondary | ICD-10-CM | POA: Diagnosis not present

## 2021-07-01 DIAGNOSIS — L03115 Cellulitis of right lower limb: Secondary | ICD-10-CM | POA: Diagnosis not present

## 2021-07-01 DIAGNOSIS — Z23 Encounter for immunization: Secondary | ICD-10-CM | POA: Diagnosis not present

## 2021-07-01 DIAGNOSIS — Z992 Dependence on renal dialysis: Secondary | ICD-10-CM | POA: Diagnosis not present

## 2021-07-02 DIAGNOSIS — Z992 Dependence on renal dialysis: Secondary | ICD-10-CM | POA: Diagnosis not present

## 2021-07-02 DIAGNOSIS — L03115 Cellulitis of right lower limb: Secondary | ICD-10-CM | POA: Diagnosis not present

## 2021-07-02 DIAGNOSIS — N2581 Secondary hyperparathyroidism of renal origin: Secondary | ICD-10-CM | POA: Diagnosis not present

## 2021-07-02 DIAGNOSIS — Z23 Encounter for immunization: Secondary | ICD-10-CM | POA: Diagnosis not present

## 2021-07-02 DIAGNOSIS — N186 End stage renal disease: Secondary | ICD-10-CM | POA: Diagnosis not present

## 2021-07-05 DIAGNOSIS — Z23 Encounter for immunization: Secondary | ICD-10-CM | POA: Diagnosis not present

## 2021-07-05 DIAGNOSIS — L03115 Cellulitis of right lower limb: Secondary | ICD-10-CM | POA: Diagnosis not present

## 2021-07-05 DIAGNOSIS — N186 End stage renal disease: Secondary | ICD-10-CM | POA: Diagnosis not present

## 2021-07-05 DIAGNOSIS — N2581 Secondary hyperparathyroidism of renal origin: Secondary | ICD-10-CM | POA: Diagnosis not present

## 2021-07-05 DIAGNOSIS — Z992 Dependence on renal dialysis: Secondary | ICD-10-CM | POA: Diagnosis not present

## 2021-07-06 NOTE — Telephone Encounter (Signed)
Called pt again, no answer and no VM set up.

## 2021-07-07 DIAGNOSIS — D509 Iron deficiency anemia, unspecified: Secondary | ICD-10-CM | POA: Diagnosis not present

## 2021-07-07 DIAGNOSIS — N2581 Secondary hyperparathyroidism of renal origin: Secondary | ICD-10-CM | POA: Diagnosis not present

## 2021-07-07 DIAGNOSIS — N186 End stage renal disease: Secondary | ICD-10-CM | POA: Diagnosis not present

## 2021-07-07 DIAGNOSIS — Z20822 Contact with and (suspected) exposure to covid-19: Secondary | ICD-10-CM | POA: Diagnosis not present

## 2021-07-07 DIAGNOSIS — L03115 Cellulitis of right lower limb: Secondary | ICD-10-CM | POA: Diagnosis not present

## 2021-07-07 DIAGNOSIS — Z23 Encounter for immunization: Secondary | ICD-10-CM | POA: Diagnosis not present

## 2021-07-07 DIAGNOSIS — Z992 Dependence on renal dialysis: Secondary | ICD-10-CM | POA: Diagnosis not present

## 2021-07-07 DIAGNOSIS — I129 Hypertensive chronic kidney disease with stage 1 through stage 4 chronic kidney disease, or unspecified chronic kidney disease: Secondary | ICD-10-CM | POA: Diagnosis not present

## 2021-07-08 ENCOUNTER — Inpatient Hospital Stay: Payer: Medicare Other | Admitting: Internal Medicine

## 2021-07-09 DIAGNOSIS — Z992 Dependence on renal dialysis: Secondary | ICD-10-CM | POA: Diagnosis not present

## 2021-07-09 DIAGNOSIS — L03115 Cellulitis of right lower limb: Secondary | ICD-10-CM | POA: Diagnosis not present

## 2021-07-09 DIAGNOSIS — D509 Iron deficiency anemia, unspecified: Secondary | ICD-10-CM | POA: Diagnosis not present

## 2021-07-09 DIAGNOSIS — Z23 Encounter for immunization: Secondary | ICD-10-CM | POA: Diagnosis not present

## 2021-07-09 DIAGNOSIS — N2581 Secondary hyperparathyroidism of renal origin: Secondary | ICD-10-CM | POA: Diagnosis not present

## 2021-07-09 DIAGNOSIS — N186 End stage renal disease: Secondary | ICD-10-CM | POA: Diagnosis not present

## 2021-07-12 DIAGNOSIS — Z23 Encounter for immunization: Secondary | ICD-10-CM | POA: Diagnosis not present

## 2021-07-12 DIAGNOSIS — N2581 Secondary hyperparathyroidism of renal origin: Secondary | ICD-10-CM | POA: Diagnosis not present

## 2021-07-12 DIAGNOSIS — Z992 Dependence on renal dialysis: Secondary | ICD-10-CM | POA: Diagnosis not present

## 2021-07-12 DIAGNOSIS — L03115 Cellulitis of right lower limb: Secondary | ICD-10-CM | POA: Diagnosis not present

## 2021-07-12 DIAGNOSIS — D509 Iron deficiency anemia, unspecified: Secondary | ICD-10-CM | POA: Diagnosis not present

## 2021-07-12 DIAGNOSIS — N186 End stage renal disease: Secondary | ICD-10-CM | POA: Diagnosis not present

## 2021-07-14 DIAGNOSIS — Z992 Dependence on renal dialysis: Secondary | ICD-10-CM | POA: Diagnosis not present

## 2021-07-14 DIAGNOSIS — N186 End stage renal disease: Secondary | ICD-10-CM | POA: Diagnosis not present

## 2021-07-14 DIAGNOSIS — Z23 Encounter for immunization: Secondary | ICD-10-CM | POA: Diagnosis not present

## 2021-07-14 DIAGNOSIS — N2581 Secondary hyperparathyroidism of renal origin: Secondary | ICD-10-CM | POA: Diagnosis not present

## 2021-07-14 DIAGNOSIS — L03115 Cellulitis of right lower limb: Secondary | ICD-10-CM | POA: Diagnosis not present

## 2021-07-14 DIAGNOSIS — D509 Iron deficiency anemia, unspecified: Secondary | ICD-10-CM | POA: Diagnosis not present

## 2021-07-16 DIAGNOSIS — N2581 Secondary hyperparathyroidism of renal origin: Secondary | ICD-10-CM | POA: Diagnosis not present

## 2021-07-16 DIAGNOSIS — N186 End stage renal disease: Secondary | ICD-10-CM | POA: Diagnosis not present

## 2021-07-16 DIAGNOSIS — D509 Iron deficiency anemia, unspecified: Secondary | ICD-10-CM | POA: Diagnosis not present

## 2021-07-16 DIAGNOSIS — Z992 Dependence on renal dialysis: Secondary | ICD-10-CM | POA: Diagnosis not present

## 2021-07-16 DIAGNOSIS — L03115 Cellulitis of right lower limb: Secondary | ICD-10-CM | POA: Diagnosis not present

## 2021-07-16 DIAGNOSIS — Z23 Encounter for immunization: Secondary | ICD-10-CM | POA: Diagnosis not present

## 2021-07-21 DIAGNOSIS — Z23 Encounter for immunization: Secondary | ICD-10-CM | POA: Diagnosis not present

## 2021-07-21 DIAGNOSIS — N2581 Secondary hyperparathyroidism of renal origin: Secondary | ICD-10-CM | POA: Diagnosis not present

## 2021-07-21 DIAGNOSIS — Z992 Dependence on renal dialysis: Secondary | ICD-10-CM | POA: Diagnosis not present

## 2021-07-21 DIAGNOSIS — L03115 Cellulitis of right lower limb: Secondary | ICD-10-CM | POA: Diagnosis not present

## 2021-07-21 DIAGNOSIS — N186 End stage renal disease: Secondary | ICD-10-CM | POA: Diagnosis not present

## 2021-07-21 DIAGNOSIS — D509 Iron deficiency anemia, unspecified: Secondary | ICD-10-CM | POA: Diagnosis not present

## 2021-07-22 ENCOUNTER — Other Ambulatory Visit: Payer: Self-pay

## 2021-07-22 ENCOUNTER — Ambulatory Visit (INDEPENDENT_AMBULATORY_CARE_PROVIDER_SITE_OTHER): Payer: Medicare Other | Admitting: Orthopedic Surgery

## 2021-07-22 DIAGNOSIS — Z89412 Acquired absence of left great toe: Secondary | ICD-10-CM

## 2021-07-23 DIAGNOSIS — Z992 Dependence on renal dialysis: Secondary | ICD-10-CM | POA: Diagnosis not present

## 2021-07-23 DIAGNOSIS — Z23 Encounter for immunization: Secondary | ICD-10-CM | POA: Diagnosis not present

## 2021-07-23 DIAGNOSIS — N2581 Secondary hyperparathyroidism of renal origin: Secondary | ICD-10-CM | POA: Diagnosis not present

## 2021-07-23 DIAGNOSIS — N186 End stage renal disease: Secondary | ICD-10-CM | POA: Diagnosis not present

## 2021-07-23 DIAGNOSIS — D509 Iron deficiency anemia, unspecified: Secondary | ICD-10-CM | POA: Diagnosis not present

## 2021-07-23 DIAGNOSIS — L03115 Cellulitis of right lower limb: Secondary | ICD-10-CM | POA: Diagnosis not present

## 2021-07-25 ENCOUNTER — Encounter: Payer: Self-pay | Admitting: Orthopedic Surgery

## 2021-07-25 NOTE — Progress Notes (Signed)
Office Visit Note   Patient: Darryl Diaz           Date of Birth: 06-29-1940           MRN: [id number] Visit Date: 07/22/2021              Requested by: Gaspar Garbe, MD [address] [place],  [place] [address] PCP: Gaspar Garbe, MD  Chief Complaint  Patient presents with   Left Foot - Routine Post Op    05/19/21 left GT amputation      HPI: Patient is a 81 year old gentleman who is 2 months status post left great toe amputation.  Assessment & Plan: Visit Diagnoses:  1. Left great toe amputee [facility])     Plan: Patient will advance activities without restrictions regular shoewear.  Follow-Up Instructions: Return if symptoms worsen or fail to improve.   Ortho Exam  Patient is alert, oriented, no adenopathy, well-dressed, normal affect, normal respiratory effort. Examination incision is well-healed sutures are harvested.  There is no redness cellulitis or drainage no signs of infection.  Imaging: No results found. No images are attached to the encounter.  Labs: Lab Results  Component Value Date   HGBA1C 4.4 (L) 09/07/2020   HGBA1C 4.7 (L) 02/28/2019   ESRSEDRATE 25 (H) 06/07/2021   CRP 3.2 (H) 06/10/2021   CRP 4.1 (H) 06/07/2021   REPTSTATUS 06/13/2021 FINAL 06/08/2021   GRAMSTAIN  01/20/2021    RARE WBC PRESENT,BOTH PMN AND MONONUCLEAR RARE GRAM POSITIVE COCCI IN PAIRS Performed at [facility], [address] [place], [place] [address]    CULT  06/08/2021    NO GROWTH 5 DAYS Performed at [facility], [address] [place], [place] [address]    [facility] STAPHYLOCOCCUS AUREUS 01/20/2021     Lab Results  Component Value Date   ALBUMIN 2.2 (L) 06/11/2021   ALBUMIN 2.3 (L) 06/09/2021   ALBUMIN 2.6 (L) 06/08/2021    Lab Results  Component Value Date   MG 2.0 06/08/2021   MG 1.9 06/07/2021   MG 2.0 01/09/2021   No results found for: VD25OH  No results found for: PREALBUMIN CBC EXTENDED Latest Ref Rng & Units 06/11/2021  06/09/2021 06/08/2021  WBC 4.0 - 10.5 K/uL 4.7 4.2 5.6  RBC 4.22 - 5.81 MIL/uL 2.99(L) 3.07(L) 3.26(L)  HGB 13.0 - 17.0 g/dL 2.9(B) 2.8(U) 10.1(L)  HCT 39.0 - 52.0 % 28.9(L) 29.5(L) 31.4(L)  PLT 150 - 400 K/uL 145(L) 157 176  NEUTROABS 1.7 - 7.7 K/uL - - 4.4  LYMPHSABS 0.7 - 4.0 K/uL - - 0.6(L)     There is no height or weight on file to calculate BMI.  Orders:  No orders of the defined types were placed in this encounter.  No orders of the defined types were placed in this encounter.    Procedures: No procedures performed  Clinical Data: No additional findings.  ROS:  All other systems negative, except as noted in the HPI. Review of Systems  Objective: Vital Signs: There were no vitals taken for this visit.  Specialty Comments:  No specialty comments available.  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   Cellulitis of right foot 01/17/2021   Normocytic anemia 01/17/2021   Acute lower UTI 01/17/2021   Acute GI bleeding 01/06/2021   Symptomatic anemia 12/02/2020   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   Orthostatic hypotension 11/23/2020   Acute renal insufficiency 10/23/2020   Alcohol abuse 10/23/2020   Hypokalemia 10/23/2020   Hypomagnesemia 10/23/2020   Low back pain at multiple sites 10/23/2020   Malnutrition of mild degree Darryl Diaz: 75% to less than 90% of standard weight) (HCC) 10/23/2020   Open wound of scalp without complication 10/23/2020   Oral candidiasis 10/23/2020   Cellulitis of left foot     Cellulitis of toe of left foot 10/13/2020   Skin ulcer of left great toe (HCC) 10/13/2020   CKD (chronic kidney disease) stage 4, 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/13/2020   Chronic heart failure with preserved ejection fraction (HFpEF) (HCC) 10/13/2020   Transient weakness of left lower extremity 09/07/2020   Heme positive stool 05/14/2020   Abnormality of gait 04/27/2020   Salmonella food poisoning 03/20/2020   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HCC) 03/17/2020   Yeast infection 03/16/2020   Diarrhea 03/16/2020   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 (HCC)    Allergies    Anemia, chronic disease    History of hypertension    Debility    Syncope and collapse 02/22/2020   AKI (acute kidney injury) (HCC) 02/10/2020   Unspecified protein-calorie malnutrition (HCC) 02/10/2020   Weakness 02/06/2020   History of repair of hip joint 10/15/2019   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   Pain in right foot 06/27/2019   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   Bradycardia 04/20/2019   Gastrointestinal hemorrhage 02/25/2019   Fall at home, initial encounter 02/25/2019   Bilateral lower extremity edema 02/25/2019   Hyponatremia 02/25/2019   Fall 02/25/2019   Benign prostatic hyperplasia with lower urinary tract symptoms 12/19/2018   Iron deficiency anemia due to chronic blood loss 08/11/2018   Anemia 08/11/2018   Hardening of the aorta (main artery of the heart) (HCC) 08/11/2018   Benign neoplasm of colon 06/18/2018   History of adenomatous polyp of colon 08/10/2017   AF (paroxysmal atrial fibrillation) (HCC) 02/05/2017   Impaired fasting glucose 06/10/2016   Encounter for general adult medical examination without abnormal findings 06/03/2016   Posterior dislocation of hip, closed (HCC) 04/30/2016   Hip fracture (HCC) 03/30/2016   Hypertension 03/30/2016   Hyperlipidemia 03/30/2016   Osteoarthritis 03/30/2016   Depressive disorder 03/30/2016   Rhabdomyolysis  03/30/2016   Leukocytosis 03/30/2016   Pressure ulcer 03/30/2016   Fracture of hip (HCC) 03/30/2016   Gastroesophageal reflux disease 12/17/2015   HTN (hypertension) 12/17/2015   Long term (current) use of anticoagulants 12/17/2015   Moderate major depression, single episode (HCC) 12/17/2015   Pure hypercholesterolemia 12/17/2015   Seasonal allergic rhinitis 12/17/2015   CAD (coronary artery disease) 08/21/2015   Past Medical History:  Diagnosis Date   Alcohol abuse 10/23/2020   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HCC) 03/17/2020   Alcoholism (HCC)    in remission following wife's death   Anemia    Ascending aorta dilation (HCC)    Atrial fibrillation (HCC)    Cardiac arrest (HCC) 04/20/2019   12 min CPR with epi   Chronic diastolic CHF (congestive heart failure) (HCC)    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HCC)  Mon Wed Fri   Fatty liver    Gastritis    GERD (gastroesophageal reflux disease)    H/O seasonal allergies    History of blood transfusion    History of GI bleed    Hx of adenomatous colonic polyps 08/10/2017   Hyperlipidemia    Hypertension    Insomnia    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   Mallory-Weiss tear    OA (osteoarthritis)    hands   OSA (obstructive sleep apnea)    No longer uses CPAP   Persistent atrial fibrillation with rapid ventricular response (HCC) 02/05/2020   Recurrent syncope    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(HCC)     Family History  Problem Relation Age of Onset   Heart disease Mother 46   Hypertension Mother    Arthritis Mother    Heart failure Father 55   Stroke Maternal Aunt    Heart failure Sister    Colon cancer Neg Hx    Esophageal cancer Neg Hx    Stomach cancer Neg Hx    Rectal cancer Neg Hx     Past Surgical History:  Procedure Laterality Date   AMPUTATION Left 05/19/2021   Procedure: LEFT GREAT TOE AMPUTATION;  Surgeon: Nadara Mustard, MD;  Location: [facility] OR;  Service: Orthopedics;  Laterality: Left;    ANKLE SURGERY Left 2014   had rods put in    AV FISTULA PLACEMENT Left 04/21/2021   Procedure: LEFT ARM ARTERIOVENOUS (AV) FISTULA CREATION - Left brachiocephalic fistula;  Surgeon: Cephus Shelling, MD;  Location: [facility] OR;  Service: Vascular;  Laterality: Left;   BIOPSY  02/28/2019   Procedure: BIOPSY;  Surgeon: Rachael Fee, MD;  Location[facility] END[facility]COPY;  Service: Endoscopy;;   CARDIOVERSION  02/06/2020   CARDIOVERSION N/A 02/06/2020   Procedure: CARDIOVERSION;  Surgeon: Christell Constant, MD;  Location: [facility] ENDOSCOPY;  Service: Cardiovascular;  Laterality: N/A;   CARDIOVERSION N/A 02/24/2020   Procedure: CARDIOVERSION;  Surgeon: Christell Constant, MD;  Location: [facility] ENDOSCOPY;  Service: Cardiovascular;  Laterality: N/A;   COLONOSCOPY     [place]   COLONOSCOPY WITH PROPOFOL N/A 01/08/2021   Procedure: COLONOSCOPY WITH PROPOFOL;  Surgeon: Rachael Fee, MD;  Location[facility] END[facility]COPY;  Service: Endoscopy;  Laterality: N/A;   ESOPHAGOGASTRODUODENOSCOPY (EGD) WITH PROPOFOL N/A 02/28/2019   Procedure: ESOPHAGOGASTRODUODENOSCOPY (EGD) WITH PROPOFOL;  Surgeon: Rachael Fee, MD;  Location: [facility] ENDOSCOPY;  Service: Endoscopy;  Laterality: N/A;   HIP ARTHROPLASTY Left 04/01/2016   Procedure: ARTHROPLASTY BIPOLAR HIP (HEMIARTHROPLASTY);  Surgeon: Durene Romans, MD;  Location: [facility] OR;  Service: Orthopedics;  Laterality: Left;   HIP CLOSED REDUCTION Left 04/30/2016   Procedure: CLOSED REDUCTION HIP;  Surgeon: Toni Arthurs, MD;  Location: [facility] OR;  Service: Orthopedics;  Laterality: Left;   HOT HEMOSTASIS N/A 01/08/2021   Procedure: HOT HEMOSTASIS (ARGON PLASMA COAGULATION/BICAP);  Surgeon: Rachael Fee, MD;  Location: [facility] ENDOSCOPY;  Service: Endoscopy;  Laterality: N/A;   IR RADIOLOGIST EVAL & MGMT  12/31/2020   TONSILLECTOMY     TOTAL HIP ARTHROPLASTY Right 10/15/2019   Procedure: TOTAL HIP ARTHROPLASTY ANTERIOR APPROACH;  Surgeon: Durene Romans, MD;  Location: [facility] ORS;  Service:  Orthopedics;  Laterality: Right;  70 mins   Social History   Occupational History   Occupation: retired  Tobacco Use   Smoking status: Former    Packs/day: 1.00    Years: 20.00    Pack years: 20.00    Types: Cigarettes  Smokeless tobacco: Never   Tobacco comments:       quit 20 years  smoked on and off for 20 years  Vaping Use   Vaping Use: Never used  Substance and Sexual Activity   Alcohol use: Not Currently    Alcohol/week: 3.0 standard drinks    Types: 3 Glasses of wine per week    Comment: h/o heavy use   Drug use: Never   Sexual activity: Not Currently

## 2021-07-26 DIAGNOSIS — Z23 Encounter for immunization: Secondary | ICD-10-CM | POA: Diagnosis not present

## 2021-07-26 DIAGNOSIS — N186 End stage renal disease: Secondary | ICD-10-CM | POA: Diagnosis not present

## 2021-07-26 DIAGNOSIS — Z992 Dependence on renal dialysis: Secondary | ICD-10-CM | POA: Diagnosis not present

## 2021-07-26 DIAGNOSIS — L03115 Cellulitis of right lower limb: Secondary | ICD-10-CM | POA: Diagnosis not present

## 2021-07-26 DIAGNOSIS — D509 Iron deficiency anemia, unspecified: Secondary | ICD-10-CM | POA: Diagnosis not present

## 2021-07-26 DIAGNOSIS — N2581 Secondary hyperparathyroidism of renal origin: Secondary | ICD-10-CM | POA: Diagnosis not present

## 2021-07-27 ENCOUNTER — Telehealth: Payer: Self-pay | Admitting: *Deleted

## 2021-07-27 NOTE — Telephone Encounter (Signed)
Call placed to Dr. Loren Racer office, [phone], left a message for medical records to call back.  ?

## 2021-07-27 NOTE — Telephone Encounter (Signed)
-----   Message from Liliane Shi, [place] sent at 07/27/2021  1:37 PM EDT ----- ?Pt was to have follow up TSH and Free T4 this month (March 2023).   ?Dx: abnormal TFTs, Amiodarone Rx ?Please see if his PCP checked these this month.  If so, request labs (the last one I can find on [facility] is from [name] 2023).  If not, please arrange TSH, Free T4 in the next couple of weeks. ?Richardson Dopp, PA-C    ?07/27/2021 1:38 PM   ? ?

## 2021-07-28 DIAGNOSIS — Z992 Dependence on renal dialysis: Secondary | ICD-10-CM | POA: Diagnosis not present

## 2021-07-28 DIAGNOSIS — N186 End stage renal disease: Secondary | ICD-10-CM | POA: Diagnosis not present

## 2021-07-28 DIAGNOSIS — Z23 Encounter for immunization: Secondary | ICD-10-CM | POA: Diagnosis not present

## 2021-07-28 DIAGNOSIS — N2581 Secondary hyperparathyroidism of renal origin: Secondary | ICD-10-CM | POA: Diagnosis not present

## 2021-07-28 DIAGNOSIS — D509 Iron deficiency anemia, unspecified: Secondary | ICD-10-CM | POA: Diagnosis not present

## 2021-07-28 DIAGNOSIS — L03115 Cellulitis of right lower limb: Secondary | ICD-10-CM | POA: Diagnosis not present

## 2021-07-28 NOTE — Telephone Encounter (Signed)
Darryl Diaz called back from [facility], Dr. Loren Racer office, pt hasn't been seen since 05/2021 and isn't scheduled for any labs. ? ?I tried to call the ;pt to come in for TSH & Free T4, no answer, no machine.  ?

## 2021-07-29 NOTE — Telephone Encounter (Signed)
2nd attempt to reach pt, no answer on [phone]#, lmptcb on mobile #. ?

## 2021-07-30 DIAGNOSIS — Z992 Dependence on renal dialysis: Secondary | ICD-10-CM | POA: Diagnosis not present

## 2021-07-30 DIAGNOSIS — Z23 Encounter for immunization: Secondary | ICD-10-CM | POA: Diagnosis not present

## 2021-07-30 DIAGNOSIS — N2581 Secondary hyperparathyroidism of renal origin: Secondary | ICD-10-CM | POA: Diagnosis not present

## 2021-07-30 DIAGNOSIS — N186 End stage renal disease: Secondary | ICD-10-CM | POA: Diagnosis not present

## 2021-07-30 DIAGNOSIS — L03115 Cellulitis of right lower limb: Secondary | ICD-10-CM | POA: Diagnosis not present

## 2021-07-30 DIAGNOSIS — D509 Iron deficiency anemia, unspecified: Secondary | ICD-10-CM | POA: Diagnosis not present

## 2021-08-02 ENCOUNTER — Encounter: Payer: Self-pay | Admitting: *Deleted

## 2021-08-02 DIAGNOSIS — Z992 Dependence on renal dialysis: Secondary | ICD-10-CM | POA: Diagnosis not present

## 2021-08-02 DIAGNOSIS — N186 End stage renal disease: Secondary | ICD-10-CM | POA: Diagnosis not present

## 2021-08-02 DIAGNOSIS — L03115 Cellulitis of right lower limb: Secondary | ICD-10-CM | POA: Diagnosis not present

## 2021-08-02 DIAGNOSIS — D509 Iron deficiency anemia, unspecified: Secondary | ICD-10-CM | POA: Diagnosis not present

## 2021-08-02 DIAGNOSIS — N2581 Secondary hyperparathyroidism of renal origin: Secondary | ICD-10-CM | POA: Diagnosis not present

## 2021-08-02 DIAGNOSIS — Z23 Encounter for immunization: Secondary | ICD-10-CM | POA: Diagnosis not present

## 2021-08-02 NOTE — Telephone Encounter (Signed)
3rd attempt to reach pt regarding message below, left another message for pt to call back. ? ?I have been unable to reach this patient by phone.  A letter is being sent to the last known home address. ? ?

## 2021-08-03 DIAGNOSIS — I10 Essential (primary) hypertension: Secondary | ICD-10-CM | POA: Diagnosis not present

## 2021-08-03 DIAGNOSIS — J302 Other seasonal allergic rhinitis: Secondary | ICD-10-CM | POA: Diagnosis not present

## 2021-08-03 DIAGNOSIS — I509 Heart failure, unspecified: Secondary | ICD-10-CM | POA: Diagnosis not present

## 2021-08-03 DIAGNOSIS — G4700 Insomnia, unspecified: Secondary | ICD-10-CM | POA: Diagnosis not present

## 2021-08-03 DIAGNOSIS — K219 Gastro-esophageal reflux disease without esophagitis: Secondary | ICD-10-CM | POA: Diagnosis not present

## 2021-08-03 DIAGNOSIS — Z89412 Acquired absence of left great toe: Secondary | ICD-10-CM | POA: Diagnosis not present

## 2021-08-03 DIAGNOSIS — Z8679 Personal history of other diseases of the circulatory system: Secondary | ICD-10-CM | POA: Diagnosis not present

## 2021-08-03 DIAGNOSIS — Z6825 Body mass index (BMI) 25.0-25.9, adult: Secondary | ICD-10-CM | POA: Diagnosis not present

## 2021-08-03 DIAGNOSIS — E785 Hyperlipidemia, unspecified: Secondary | ICD-10-CM | POA: Diagnosis not present

## 2021-08-03 DIAGNOSIS — F331 Major depressive disorder, recurrent, moderate: Secondary | ICD-10-CM | POA: Diagnosis not present

## 2021-08-04 DIAGNOSIS — N2581 Secondary hyperparathyroidism of renal origin: Secondary | ICD-10-CM | POA: Diagnosis not present

## 2021-08-04 DIAGNOSIS — L03115 Cellulitis of right lower limb: Secondary | ICD-10-CM | POA: Diagnosis not present

## 2021-08-04 DIAGNOSIS — Z23 Encounter for immunization: Secondary | ICD-10-CM | POA: Diagnosis not present

## 2021-08-04 DIAGNOSIS — Z992 Dependence on renal dialysis: Secondary | ICD-10-CM | POA: Diagnosis not present

## 2021-08-04 DIAGNOSIS — N186 End stage renal disease: Secondary | ICD-10-CM | POA: Diagnosis not present

## 2021-08-04 DIAGNOSIS — D509 Iron deficiency anemia, unspecified: Secondary | ICD-10-CM | POA: Diagnosis not present

## 2021-08-05 DIAGNOSIS — Z992 Dependence on renal dialysis: Secondary | ICD-10-CM | POA: Diagnosis not present

## 2021-08-05 DIAGNOSIS — D649 Anemia, unspecified: Secondary | ICD-10-CM | POA: Diagnosis not present

## 2021-08-05 DIAGNOSIS — Z7689 Persons encountering health services in other specified circumstances: Secondary | ICD-10-CM | POA: Diagnosis not present

## 2021-08-05 DIAGNOSIS — N186 End stage renal disease: Secondary | ICD-10-CM | POA: Diagnosis not present

## 2021-08-06 DIAGNOSIS — Z23 Encounter for immunization: Secondary | ICD-10-CM | POA: Diagnosis not present

## 2021-08-06 DIAGNOSIS — Z992 Dependence on renal dialysis: Secondary | ICD-10-CM | POA: Diagnosis not present

## 2021-08-06 DIAGNOSIS — N2581 Secondary hyperparathyroidism of renal origin: Secondary | ICD-10-CM | POA: Diagnosis not present

## 2021-08-06 DIAGNOSIS — D509 Iron deficiency anemia, unspecified: Secondary | ICD-10-CM | POA: Diagnosis not present

## 2021-08-06 DIAGNOSIS — L03115 Cellulitis of right lower limb: Secondary | ICD-10-CM | POA: Diagnosis not present

## 2021-08-06 DIAGNOSIS — N186 End stage renal disease: Secondary | ICD-10-CM | POA: Diagnosis not present

## 2021-08-07 DIAGNOSIS — Z992 Dependence on renal dialysis: Secondary | ICD-10-CM | POA: Diagnosis not present

## 2021-08-07 DIAGNOSIS — I129 Hypertensive chronic kidney disease with stage 1 through stage 4 chronic kidney disease, or unspecified chronic kidney disease: Secondary | ICD-10-CM | POA: Diagnosis not present

## 2021-08-07 DIAGNOSIS — N186 End stage renal disease: Secondary | ICD-10-CM | POA: Diagnosis not present

## 2021-08-09 DIAGNOSIS — Z992 Dependence on renal dialysis: Secondary | ICD-10-CM | POA: Diagnosis not present

## 2021-08-09 DIAGNOSIS — N186 End stage renal disease: Secondary | ICD-10-CM | POA: Diagnosis not present

## 2021-08-09 DIAGNOSIS — N2581 Secondary hyperparathyroidism of renal origin: Secondary | ICD-10-CM | POA: Diagnosis not present

## 2021-08-11 DIAGNOSIS — N2581 Secondary hyperparathyroidism of renal origin: Secondary | ICD-10-CM | POA: Diagnosis not present

## 2021-08-11 DIAGNOSIS — Z992 Dependence on renal dialysis: Secondary | ICD-10-CM | POA: Diagnosis not present

## 2021-08-11 DIAGNOSIS — N186 End stage renal disease: Secondary | ICD-10-CM | POA: Diagnosis not present

## 2021-08-13 DIAGNOSIS — N2581 Secondary hyperparathyroidism of renal origin: Secondary | ICD-10-CM | POA: Diagnosis not present

## 2021-08-13 DIAGNOSIS — Z992 Dependence on renal dialysis: Secondary | ICD-10-CM | POA: Diagnosis not present

## 2021-08-13 DIAGNOSIS — N186 End stage renal disease: Secondary | ICD-10-CM | POA: Diagnosis not present

## 2021-08-16 DIAGNOSIS — Z992 Dependence on renal dialysis: Secondary | ICD-10-CM | POA: Diagnosis not present

## 2021-08-16 DIAGNOSIS — N186 End stage renal disease: Secondary | ICD-10-CM | POA: Diagnosis not present

## 2021-08-16 DIAGNOSIS — N2581 Secondary hyperparathyroidism of renal origin: Secondary | ICD-10-CM | POA: Diagnosis not present

## 2021-08-17 DIAGNOSIS — Z9181 History of falling: Secondary | ICD-10-CM | POA: Diagnosis not present

## 2021-08-17 DIAGNOSIS — M6281 Muscle weakness (generalized): Secondary | ICD-10-CM | POA: Diagnosis not present

## 2021-08-17 DIAGNOSIS — N186 End stage renal disease: Secondary | ICD-10-CM | POA: Diagnosis not present

## 2021-08-17 DIAGNOSIS — Z992 Dependence on renal dialysis: Secondary | ICD-10-CM | POA: Diagnosis not present

## 2021-08-17 DIAGNOSIS — R2689 Other abnormalities of gait and mobility: Secondary | ICD-10-CM | POA: Diagnosis not present

## 2021-08-18 DIAGNOSIS — N186 End stage renal disease: Secondary | ICD-10-CM | POA: Diagnosis not present

## 2021-08-18 DIAGNOSIS — N2581 Secondary hyperparathyroidism of renal origin: Secondary | ICD-10-CM | POA: Diagnosis not present

## 2021-08-18 DIAGNOSIS — Z992 Dependence on renal dialysis: Secondary | ICD-10-CM | POA: Diagnosis not present

## 2021-08-20 DIAGNOSIS — N2581 Secondary hyperparathyroidism of renal origin: Secondary | ICD-10-CM | POA: Diagnosis not present

## 2021-08-20 DIAGNOSIS — R600 Localized edema: Secondary | ICD-10-CM | POA: Diagnosis not present

## 2021-08-20 DIAGNOSIS — Z992 Dependence on renal dialysis: Secondary | ICD-10-CM | POA: Diagnosis not present

## 2021-08-20 DIAGNOSIS — N186 End stage renal disease: Secondary | ICD-10-CM | POA: Diagnosis not present

## 2021-08-23 DIAGNOSIS — Z20822 Contact with and (suspected) exposure to covid-19: Secondary | ICD-10-CM | POA: Diagnosis not present

## 2021-08-23 DIAGNOSIS — N2581 Secondary hyperparathyroidism of renal origin: Secondary | ICD-10-CM | POA: Diagnosis not present

## 2021-08-23 DIAGNOSIS — N186 End stage renal disease: Secondary | ICD-10-CM | POA: Diagnosis not present

## 2021-08-23 DIAGNOSIS — Z992 Dependence on renal dialysis: Secondary | ICD-10-CM | POA: Diagnosis not present

## 2021-08-25 ENCOUNTER — Other Ambulatory Visit: Payer: Self-pay | Admitting: *Deleted

## 2021-08-25 DIAGNOSIS — N186 End stage renal disease: Secondary | ICD-10-CM | POA: Diagnosis not present

## 2021-08-25 DIAGNOSIS — Z992 Dependence on renal dialysis: Secondary | ICD-10-CM | POA: Diagnosis not present

## 2021-08-25 DIAGNOSIS — N2581 Secondary hyperparathyroidism of renal origin: Secondary | ICD-10-CM | POA: Diagnosis not present

## 2021-08-25 NOTE — Patient Outreach (Signed)
[facility] Post- Acute Care Coordinator follow up. Per [facility] Mr. Schwabe currently resides in [facility].  ? ?Verified with Annabelle Harman SNF Admissions Coordinator reports Mr. Andres reports is staying as LTC resident at [facility]. ? ?No identifiable [facility] Care Management needs.  ? ? ? ?[name], MSN, RN,BSN ?[place] Coordinator ?[phone] [facility]) ?[phone]  (Toll free office)   ?

## 2021-08-26 DIAGNOSIS — Z452 Encounter for adjustment and management of vascular access device: Secondary | ICD-10-CM | POA: Diagnosis not present

## 2021-08-26 DIAGNOSIS — Z992 Dependence on renal dialysis: Secondary | ICD-10-CM | POA: Diagnosis not present

## 2021-08-26 DIAGNOSIS — N186 End stage renal disease: Secondary | ICD-10-CM | POA: Diagnosis not present

## 2021-08-27 DIAGNOSIS — R4182 Altered mental status, unspecified: Secondary | ICD-10-CM | POA: Diagnosis not present

## 2021-08-27 DIAGNOSIS — N2581 Secondary hyperparathyroidism of renal origin: Secondary | ICD-10-CM | POA: Diagnosis not present

## 2021-08-27 DIAGNOSIS — Z992 Dependence on renal dialysis: Secondary | ICD-10-CM | POA: Diagnosis not present

## 2021-08-27 DIAGNOSIS — N186 End stage renal disease: Secondary | ICD-10-CM | POA: Diagnosis not present

## 2021-08-30 DIAGNOSIS — Z91199 Patient's noncompliance with other medical treatment and regimen due to unspecified reason: Secondary | ICD-10-CM | POA: Diagnosis not present

## 2021-08-30 DIAGNOSIS — N2581 Secondary hyperparathyroidism of renal origin: Secondary | ICD-10-CM | POA: Diagnosis not present

## 2021-08-30 DIAGNOSIS — Z992 Dependence on renal dialysis: Secondary | ICD-10-CM | POA: Diagnosis not present

## 2021-08-30 DIAGNOSIS — Z7689 Persons encountering health services in other specified circumstances: Secondary | ICD-10-CM | POA: Diagnosis not present

## 2021-08-30 DIAGNOSIS — R4182 Altered mental status, unspecified: Secondary | ICD-10-CM | POA: Diagnosis not present

## 2021-08-30 DIAGNOSIS — N186 End stage renal disease: Secondary | ICD-10-CM | POA: Diagnosis not present

## 2021-08-31 ENCOUNTER — Other Ambulatory Visit: Payer: Self-pay | Admitting: *Deleted

## 2021-08-31 DIAGNOSIS — R2689 Other abnormalities of gait and mobility: Secondary | ICD-10-CM | POA: Diagnosis not present

## 2021-08-31 DIAGNOSIS — Z992 Dependence on renal dialysis: Secondary | ICD-10-CM | POA: Diagnosis not present

## 2021-08-31 DIAGNOSIS — N186 End stage renal disease: Secondary | ICD-10-CM | POA: Diagnosis not present

## 2021-08-31 DIAGNOSIS — N2581 Secondary hyperparathyroidism of renal origin: Secondary | ICD-10-CM | POA: Diagnosis not present

## 2021-08-31 DIAGNOSIS — Z9181 History of falling: Secondary | ICD-10-CM | POA: Diagnosis not present

## 2021-08-31 DIAGNOSIS — M6281 Muscle weakness (generalized): Secondary | ICD-10-CM | POA: Diagnosis not present

## 2021-08-31 NOTE — Patient Outreach (Addendum)
[facility]N Post- Acute Care Coordinator follow up. ? ?Facility site visit to [facility] skilled nursing facility. Spoke with Claiborne Billings in Admissions at [facility]. Claiborne Billings reports Mr. Antonellis has elected to return home instead or remaining in LTC.  ? ?Writer unable to see Mr. Maalouf during SNF visit because he was off unit. ? ?Will attempt outreach again to discuss [place] Management services.  ? ?Will follow up with SNF SW to inquire about home health arrangements.  ? ? ?[name], MSN, RN,BSN ?[place] Coordinator ?[phone] [facility]) ?[phone]  (Toll free office)  ? ? ?

## 2021-09-01 ENCOUNTER — Other Ambulatory Visit: Payer: Self-pay | Admitting: *Deleted

## 2021-09-01 DIAGNOSIS — N186 End stage renal disease: Secondary | ICD-10-CM

## 2021-09-01 NOTE — Patient Outreach (Signed)
THN Post- Acute Care Coordinator follow up. Member screened for potential [facility] services as a a benefit of Mr. [facility] insurance plan.  ? ?Confirmed with Darryl Diaz SNF SW Darryl Diaz transitioned to home today 09/01/21. [facility] home health was arranged.  ? ?Telephone call made to speak with Darryl Diaz at [phone]. Number no longer in service. Attempted to reach on mobile [phone]. No answer and no voicemail capability.  ? ?Telephone call made to member's son Darryl Diaz [phone]. Patient identifiers confirmed. Darryl Diaz reports he is currently packing up all of Darryl Diaz things at [facility]. States Darryl Diaz had been living in [place] at [facility] but [place] unable [place] return to [place] due to level of function. Darryl Diaz had been in long term care at [facility]. States Darryl Diaz disliked long term care and adamantly opted to return to his house instead. Darryl Diaz states caregivers are being arranged thru [facility] to run errands and the like for DarrylChristoper Diaz. States initial assessment is scheduled for tomorrow. Darryl Diaz states outpatient HD has been arranged for Mondays, Wednesdays, Fridays.  ? ?Darryl Diaz reports Darryl Diaz home phone not in service. Best contact number for Darryl Diaz is cell [phone]. Confirmed PCP is Darryl Diaz. ? ?Discussed writer will make referral to [place] for care coordination. Will notate the best contact telephone number for Darryl Diaz as well in referral.  ? ? ? ?Darryl Rolling, MSN, RN,BSN ?[place] Coordinator ?[phone] [facility]) ?[phone]  (Toll free office)   ? ? ? ? ?  ?

## 2021-09-01 NOTE — Patient Outreach (Signed)
Received a referral from [name],  MSN, RN,BSN-[facility] [place]. ?I have assigned [name], RN to call for follow up and determine if there are any Case Management needs.  ?  ?[place] Care, CBCS, CMAA ?[place] Management Assistant ?[place] [facility] ?[phone]   ?

## 2021-09-02 ENCOUNTER — Other Ambulatory Visit: Payer: Self-pay | Admitting: *Deleted

## 2021-09-02 NOTE — Patient Outreach (Signed)
[facility]) Care Management ? ?09/02/2021 ? ?[name] ?[birth year] ?[id number] ? ? ?Referral Received 09/01/2021 ?Post SNF Discharge: 09/01/2021 ?Initial Outreach 09/02/2021- Unsuccessful ? ?Hx. Pt receives HD on M-W-F, [facility] ordered to be involved and pt will be receiving [facility] services. ? ?RN attempted outreach call however unsuccessful and unable to leave voice message to either the home or mobile.  ? ?Will follow up once again in a few days. ? ?[name], RN ?Care Management Coordinator ?[place] ?Main Office [phone]  ?

## 2021-09-03 DIAGNOSIS — N186 End stage renal disease: Secondary | ICD-10-CM | POA: Diagnosis not present

## 2021-09-03 DIAGNOSIS — Z992 Dependence on renal dialysis: Secondary | ICD-10-CM | POA: Diagnosis not present

## 2021-09-03 DIAGNOSIS — N2581 Secondary hyperparathyroidism of renal origin: Secondary | ICD-10-CM | POA: Diagnosis not present

## 2021-09-06 DIAGNOSIS — N186 End stage renal disease: Secondary | ICD-10-CM | POA: Diagnosis not present

## 2021-09-06 DIAGNOSIS — Z23 Encounter for immunization: Secondary | ICD-10-CM | POA: Diagnosis not present

## 2021-09-06 DIAGNOSIS — D509 Iron deficiency anemia, unspecified: Secondary | ICD-10-CM | POA: Diagnosis not present

## 2021-09-06 DIAGNOSIS — Z992 Dependence on renal dialysis: Secondary | ICD-10-CM | POA: Diagnosis not present

## 2021-09-06 DIAGNOSIS — I129 Hypertensive chronic kidney disease with stage 1 through stage 4 chronic kidney disease, or unspecified chronic kidney disease: Secondary | ICD-10-CM | POA: Diagnosis not present

## 2021-09-06 DIAGNOSIS — N2581 Secondary hyperparathyroidism of renal origin: Secondary | ICD-10-CM | POA: Diagnosis not present

## 2021-09-08 DIAGNOSIS — N2581 Secondary hyperparathyroidism of renal origin: Secondary | ICD-10-CM | POA: Diagnosis not present

## 2021-09-08 DIAGNOSIS — Z23 Encounter for immunization: Secondary | ICD-10-CM | POA: Diagnosis not present

## 2021-09-08 DIAGNOSIS — N186 End stage renal disease: Secondary | ICD-10-CM | POA: Diagnosis not present

## 2021-09-08 DIAGNOSIS — D509 Iron deficiency anemia, unspecified: Secondary | ICD-10-CM | POA: Diagnosis not present

## 2021-09-08 DIAGNOSIS — Z992 Dependence on renal dialysis: Secondary | ICD-10-CM | POA: Diagnosis not present

## 2021-09-09 ENCOUNTER — Other Ambulatory Visit: Payer: Self-pay | Admitting: *Deleted

## 2021-09-09 NOTE — Patient Outreach (Signed)
[facility]) Care Management ? ?09/09/2021 ? ?Darryl Diaz ?19-Apr-1941 ?[id number] ? ? ?Transition of care-Unsuccessful #2 ? ?RN attempted outreach call however unsuccessful and unable to leave HIPAA voice message.  ? ?Will continue to outreach for services and follow up once again over the next week. ? ?[name], RN ?Care Management Coordinator ?[place] ?Main Office [phone]  ?

## 2021-09-10 DIAGNOSIS — N186 End stage renal disease: Secondary | ICD-10-CM | POA: Diagnosis not present

## 2021-09-10 DIAGNOSIS — Z992 Dependence on renal dialysis: Secondary | ICD-10-CM | POA: Diagnosis not present

## 2021-09-10 DIAGNOSIS — N2581 Secondary hyperparathyroidism of renal origin: Secondary | ICD-10-CM | POA: Diagnosis not present

## 2021-09-10 DIAGNOSIS — Z23 Encounter for immunization: Secondary | ICD-10-CM | POA: Diagnosis not present

## 2021-09-10 DIAGNOSIS — D509 Iron deficiency anemia, unspecified: Secondary | ICD-10-CM | POA: Diagnosis not present

## 2021-09-13 DIAGNOSIS — Z23 Encounter for immunization: Secondary | ICD-10-CM | POA: Diagnosis not present

## 2021-09-13 DIAGNOSIS — Z992 Dependence on renal dialysis: Secondary | ICD-10-CM | POA: Diagnosis not present

## 2021-09-13 DIAGNOSIS — N2581 Secondary hyperparathyroidism of renal origin: Secondary | ICD-10-CM | POA: Diagnosis not present

## 2021-09-13 DIAGNOSIS — D509 Iron deficiency anemia, unspecified: Secondary | ICD-10-CM | POA: Diagnosis not present

## 2021-09-13 DIAGNOSIS — N186 End stage renal disease: Secondary | ICD-10-CM | POA: Diagnosis not present

## 2021-09-13 NOTE — Progress Notes (Deleted)
Cardiology Office Note:    Date:  09/13/2021   ID:  RINGO SHEROD, DOB 03-13-1941, MRN [id number]  PCP:  [name], MD  [facility] Providers Cardiologist:  Sherren Mocha, MD Cardiology APP:  Liliane Shi, PA-C  Electrophysiologist:  Virl Axe, MD { Click to update primary MD,subspecialty MD or APP then REFRESH:1}  *** Referring MD: [name], MD   Chief Complaint:  No chief complaint on file. {Click here for Visit Info    :1}   Patient Profile: Paroxysmal atrial fibrillation  A/c held in 08/2018 due to posthemorrhagic anemia 2/2 fall Amio started during admit w MW tear, AF w RVR in 02/2019>>DCd due to brady in 04/2019 Admx 9/21 w syncope, AF w RVR>>DCCV>>ERAF Flecainide >> DCCV>>ERAF >>Amiodarone (CCB, BB DCd) Not a candidate for Sotalol due to brady; Dofetilide due to long QT Will need PPM if recurrent brady; although not ideal candidate due to infection/wound care issues  Admx 11/21: AF w RVR in setting of Salmonella gastroenteritis >> Amiodarone adjusted>>NSR anticoagulation DCd in 9/22 due to anemia 2/2 GI bleeding; frailty [Pt not a candidate for LAAOD] Tachy-Brady syndrome Symptomatic bradycardia admx 04/2019 >> Amiodarone, Diltiazem, Metoprolol DC'd Eval by EP 1/21 (Dr. 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 Joelyn Oms  Hypertension  Hyperlipidemia  Hx of UGI bleed s/p MW tear in 02/2019 Hx of ETOH abuse Hepatic cirrhosis  Sleep apnea  Admitted w salmonella gastroenteritis in 11/21 c/b AKI, AF w RVR, falls Frequent falls  Orthostatic hypotension Midodrine Rx  Hx of TIA in 5/22 Chronic anemia Hx of Lower GI bleed in 9/22 Osteomyelitis s/p L great toe amp in 1/23 Aortic atherosclerosis   DNR  Prior CV Studies: *** {Select studies to display:26339}  Echocardiogram 01/17/21 EF 60-65, no RWMA, mild LVH, normal RVSF, severe LAE, trivial MR, mild aortic stenosis (mean gradient 42mHg, V-max 235 cm/s), mild dilation of aortic root (40 mm)   Echocardiogram 09/08/20 EF 60-65, no RWMA, mild LVH, G1 DD, normal RVSF, mild LAE, mild AV sclerosis, dilation of aortic root (41 mm)   Myoview 02/23/20  Normal EF, normal perfusion; low risk   History of Present Illness:   Darryl PLUSHis a 81y.o. Diaz with the above problem list.  He was last seen in [name] 2023.  He had an admission later in [name] 2023 for falls and L 2nd toe osteomyelitis.  He was placed on antibiotics.  He returns for f/u.  ***        Past Medical History:  Diagnosis Date   Alcohol abuse 077/82/4235  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[facility] 03/17/2020   Alcoholism ([facility]    in remission following wife's death   Anemia    Ascending aorta dilation (HCC)    Atrial fibrillation (HCC)    Cardiac arrest ([facility] 04/20/2019   12 min CPR with epi   Chronic diastolic CHF (congestive heart failure) ([facility]    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[facility]    Mon Wed Fri   Fatty liver    Gastritis    GERD (gastroesophageal reflux disease)    H/O seasonal allergies    History of blood transfusion    History of GI bleed    Hx of adenomatous colonic  polyps 08/10/2017   Hyperlipidemia    Hypertension    Insomnia    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   Mallory-Weiss tear    OA (osteoarthritis)    hands   OSA (obstructive sleep apnea)    No longer uses CPAP   Persistent atrial fibrillation with rapid ventricular response ([place]) 02/05/2020   Recurrent syncope    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([place])    Current Medications: No outpatient medications have been marked as taking for the 09/14/21 encounter (Appointment) with Richardson Dopp T, PA-C.    Allergies:   Penicillins   Social History   Tobacco Use   Smoking  status: Former    Packs/day: 1.00    Years: 20.00    Pack years: 20.00    Types: Cigarettes   Smokeless tobacco: Never   Tobacco comments:       quit 20 years  smoked on and off for 20 years  Vaping Use   Vaping Use: Never used  Substance Use Topics   Alcohol use: Not Currently    Alcohol/week: 3.0 standard drinks    Types: 3 Glasses of wine per week    Comment: h/o heavy use   Drug use: Never    Family Hx: The patient's family history includes Arthritis in his mother; Heart disease (age of onset: 74) in his mother; Heart failure in his sister; Heart failure (age of onset: 1) in his father; Hypertension in his mother; Stroke in his maternal aunt. There is no history of Colon cancer, Esophageal cancer, Stomach cancer, or Rectal cancer.  ROS   EKGs/Labs/Other Test Reviewed:    EKG:  EKG is *** ordered today.  The ekg ordered today demonstrates ***  Recent Labs: 01/19/2021: B Natriuretic Peptide 607.4 06/08/2021: ALT 14; Magnesium 2.0; TSH 2.640 06/11/2021: BUN 30; Creatinine, Ser 4.54; Hemoglobin 9.5; Platelets 145; Potassium 3.5; Sodium 136   Recent Lipid Panel No results for input(s): CHOL, TRIG, HDL, VLDL, LDLCALC, LDLDIRECT in the last 8760 hours.   Risk Assessment/Calculations:   {Does this patient have ATRIAL FIBRILLATION?[phone]     Physical Exam:    VS:  There were no vitals taken for this visit.    Wt Readings from Last 3 Encounters:  06/14/21 188 lb 4.4 oz (85.4 kg)  06/01/21 200 lb 6.4 oz (90.9 kg)  05/25/21 200 lb (90.7 kg)    Physical Exam ***     ASSESSMENT & PLAN:   No problem-specific Assessment & Plan notes found for this encounter. *** Bilateral lower extremity edema Since starting on dialysis his R leg edema has improved.  He has significant edema on the L.  This is probably related to his infection and recent surgery.  However, DVT should be ruled out.  Obtain L leg venous duplex.  He is not a candidate for anticoagulation.  If he has a DVT, we  will have to see if he is a candidate for IVC filter.     Chronic heart failure with preserved ejection fraction (HFpEF) (HCC) Volume seems to be improved since starting on dialysis.  His weight is down 14 lbs since Dec.  He remains on Torsemide as well.  Volume management is now per dialysis.     PAF (paroxysmal atrial fibrillation) (HCC) Maintaining normal sinus rhythm on Amiodarone.  Recent LFTs normal.  TSH was slightly elevated in Nov.  Repeat TSH, T4 today.  He is not a candidate for anticoagulation due to prior hx of bleeding.  Continue Amiodarone 200  mg once daily.    Hypertension The patient's blood pressure is controlled on his current regimen.  Continue current therapy.     Osteomyelitis of great toe of left foot (HCC) S/p L great toe amp. F/u with Dr. Sharol Given as directed.    ESRD on dialysis [facility]) He has dialysis every MWF.      {Are you ordering a CV Procedure (e.g. stress test, cath, DCCV, TEE, etc)?   Press F2        :[id number]}  Dispo:  No follow-ups on file.   Medication Adjustments/Labs and Tests Ordered: Current medicines are reviewed at length with the patient today.  Concerns regarding medicines are outlined above.  Tests Ordered: No orders of the defined types were placed in this encounter.  Medication Changes: No orders of the defined types were placed in this encounter.  Signed, Richardson Dopp, PA-C  09/13/2021 10:39 PM    [facility] [address], [place],   [address] Phone: [phone]; Fax: [phone]

## 2021-09-14 ENCOUNTER — Other Ambulatory Visit: Payer: Self-pay | Admitting: *Deleted

## 2021-09-14 ENCOUNTER — Ambulatory Visit: Payer: Medicare Other | Admitting: Physician Assistant

## 2021-09-14 DIAGNOSIS — I5032 Chronic diastolic (congestive) heart failure: Secondary | ICD-10-CM

## 2021-09-14 DIAGNOSIS — I480 Paroxysmal atrial fibrillation: Secondary | ICD-10-CM

## 2021-09-14 DIAGNOSIS — N186 End stage renal disease: Secondary | ICD-10-CM

## 2021-09-14 DIAGNOSIS — I10 Essential (primary) hypertension: Secondary | ICD-10-CM

## 2021-09-14 NOTE — Patient Outreach (Signed)
[facility]) Care Management ? ?09/14/2021 ? ?Darryl Diaz ?03-04-1941 ?[id number] ? ? ?Transition of care ?Outreach #3 ? ?RN attempted an outreach call today to the DPR sister [name]). RN able to leave a HIPAA approved voice message to the mobile contact. ? ?RN will continue with another outreach call over the next few weeks. No response from the outreach letter sent several weeks ago. ? ?[name], RN ?Care Management Coordinator ?[facility] ?Main Office [phone]  ? ?

## 2021-09-15 DIAGNOSIS — Z23 Encounter for immunization: Secondary | ICD-10-CM | POA: Diagnosis not present

## 2021-09-15 DIAGNOSIS — D509 Iron deficiency anemia, unspecified: Secondary | ICD-10-CM | POA: Diagnosis not present

## 2021-09-15 DIAGNOSIS — Z992 Dependence on renal dialysis: Secondary | ICD-10-CM | POA: Diagnosis not present

## 2021-09-15 DIAGNOSIS — N2581 Secondary hyperparathyroidism of renal origin: Secondary | ICD-10-CM | POA: Diagnosis not present

## 2021-09-15 DIAGNOSIS — N186 End stage renal disease: Secondary | ICD-10-CM | POA: Diagnosis not present

## 2021-09-17 DIAGNOSIS — N186 End stage renal disease: Secondary | ICD-10-CM | POA: Diagnosis not present

## 2021-09-17 DIAGNOSIS — D509 Iron deficiency anemia, unspecified: Secondary | ICD-10-CM | POA: Diagnosis not present

## 2021-09-17 DIAGNOSIS — Z23 Encounter for immunization: Secondary | ICD-10-CM | POA: Diagnosis not present

## 2021-09-17 DIAGNOSIS — N2581 Secondary hyperparathyroidism of renal origin: Secondary | ICD-10-CM | POA: Diagnosis not present

## 2021-09-17 DIAGNOSIS — Z992 Dependence on renal dialysis: Secondary | ICD-10-CM | POA: Diagnosis not present

## 2021-09-20 DIAGNOSIS — Z23 Encounter for immunization: Secondary | ICD-10-CM | POA: Diagnosis not present

## 2021-09-20 DIAGNOSIS — N186 End stage renal disease: Secondary | ICD-10-CM | POA: Diagnosis not present

## 2021-09-20 DIAGNOSIS — N2581 Secondary hyperparathyroidism of renal origin: Secondary | ICD-10-CM | POA: Diagnosis not present

## 2021-09-20 DIAGNOSIS — Z992 Dependence on renal dialysis: Secondary | ICD-10-CM | POA: Diagnosis not present

## 2021-09-20 DIAGNOSIS — D509 Iron deficiency anemia, unspecified: Secondary | ICD-10-CM | POA: Diagnosis not present

## 2021-09-22 DIAGNOSIS — N186 End stage renal disease: Secondary | ICD-10-CM | POA: Diagnosis not present

## 2021-09-22 DIAGNOSIS — Z23 Encounter for immunization: Secondary | ICD-10-CM | POA: Diagnosis not present

## 2021-09-22 DIAGNOSIS — D509 Iron deficiency anemia, unspecified: Secondary | ICD-10-CM | POA: Diagnosis not present

## 2021-09-22 DIAGNOSIS — Z992 Dependence on renal dialysis: Secondary | ICD-10-CM | POA: Diagnosis not present

## 2021-09-22 DIAGNOSIS — N2581 Secondary hyperparathyroidism of renal origin: Secondary | ICD-10-CM | POA: Diagnosis not present

## 2021-09-24 DIAGNOSIS — N2581 Secondary hyperparathyroidism of renal origin: Secondary | ICD-10-CM | POA: Diagnosis not present

## 2021-09-24 DIAGNOSIS — N186 End stage renal disease: Secondary | ICD-10-CM | POA: Diagnosis not present

## 2021-09-24 DIAGNOSIS — D509 Iron deficiency anemia, unspecified: Secondary | ICD-10-CM | POA: Diagnosis not present

## 2021-09-24 DIAGNOSIS — Z992 Dependence on renal dialysis: Secondary | ICD-10-CM | POA: Diagnosis not present

## 2021-09-24 DIAGNOSIS — Z23 Encounter for immunization: Secondary | ICD-10-CM | POA: Diagnosis not present

## 2021-09-27 ENCOUNTER — Other Ambulatory Visit: Payer: Self-pay

## 2021-09-27 ENCOUNTER — Encounter (HOSPITAL_COMMUNITY): Payer: Self-pay | Admitting: Emergency Medicine

## 2021-09-27 ENCOUNTER — Emergency Department (HOSPITAL_COMMUNITY)
Admission: EM | Admit: 2021-09-27 | Discharge: 2021-09-27 | Disposition: A | Discharge status: Home / Self Care | Payer: Medicare Other | Source: Home / Self Care | Attending: Emergency Medicine | Admitting: Emergency Medicine

## 2021-09-27 ENCOUNTER — Emergency Department (HOSPITAL_COMMUNITY): Payer: Medicare Other

## 2021-09-27 ENCOUNTER — Inpatient Hospital Stay (HOSPITAL_COMMUNITY)
Admission: EM | Admit: 2021-09-27 | Discharge: 2021-09-30 | DRG: 640 | Disposition: A | Discharge status: Skilled Nursing Facility | Payer: Medicare Other | Attending: Internal Medicine | Admitting: Internal Medicine

## 2021-09-27 DIAGNOSIS — R001 Bradycardia, unspecified: Secondary | ICD-10-CM | POA: Insufficient documentation

## 2021-09-27 DIAGNOSIS — W010XXA Fall on same level from slipping, tripping and stumbling without subsequent striking against object, initial encounter: Secondary | ICD-10-CM | POA: Diagnosis present

## 2021-09-27 DIAGNOSIS — I132 Hypertensive heart and chronic kidney disease with heart failure and with stage 5 chronic kidney disease, or end stage renal disease: Secondary | ICD-10-CM | POA: Insufficient documentation

## 2021-09-27 DIAGNOSIS — E785 Hyperlipidemia, unspecified: Secondary | ICD-10-CM | POA: Diagnosis not present

## 2021-09-27 DIAGNOSIS — I4819 Other persistent atrial fibrillation: Secondary | ICD-10-CM | POA: Diagnosis present

## 2021-09-27 DIAGNOSIS — Z823 Family history of stroke: Secondary | ICD-10-CM | POA: Diagnosis not present

## 2021-09-27 DIAGNOSIS — W19XXXA Unspecified fall, initial encounter: Secondary | ICD-10-CM | POA: Diagnosis not present

## 2021-09-27 DIAGNOSIS — D638 Anemia in other chronic diseases classified elsewhere: Secondary | ICD-10-CM | POA: Diagnosis present

## 2021-09-27 DIAGNOSIS — N186 End stage renal disease: Secondary | ICD-10-CM | POA: Diagnosis present

## 2021-09-27 DIAGNOSIS — F419 Anxiety disorder, unspecified: Secondary | ICD-10-CM | POA: Diagnosis present

## 2021-09-27 DIAGNOSIS — R5381 Other malaise: Secondary | ICD-10-CM | POA: Diagnosis present

## 2021-09-27 DIAGNOSIS — I5032 Chronic diastolic (congestive) heart failure: Secondary | ICD-10-CM | POA: Diagnosis not present

## 2021-09-27 DIAGNOSIS — M1611 Unilateral primary osteoarthritis, right hip: Secondary | ICD-10-CM | POA: Diagnosis not present

## 2021-09-27 DIAGNOSIS — Z79899 Other long term (current) drug therapy: Secondary | ICD-10-CM | POA: Insufficient documentation

## 2021-09-27 DIAGNOSIS — Y92009 Unspecified place in unspecified non-institutional (private) residence as the place of occurrence of the external cause: Secondary | ICD-10-CM | POA: Insufficient documentation

## 2021-09-27 DIAGNOSIS — Y92000 Kitchen of unspecified non-institutional (private) residence as  the place of occurrence of the external cause: Secondary | ICD-10-CM

## 2021-09-27 DIAGNOSIS — S0990XA Unspecified injury of head, initial encounter: Secondary | ICD-10-CM | POA: Insufficient documentation

## 2021-09-27 DIAGNOSIS — K7030 Alcoholic cirrhosis of liver without ascites: Secondary | ICD-10-CM | POA: Diagnosis not present

## 2021-09-27 DIAGNOSIS — S61213A Laceration without foreign body of left middle finger without damage to nail, initial encounter: Secondary | ICD-10-CM | POA: Diagnosis present

## 2021-09-27 DIAGNOSIS — E871 Hypo-osmolality and hyponatremia: Principal | ICD-10-CM | POA: Diagnosis present

## 2021-09-27 DIAGNOSIS — D631 Anemia in chronic kidney disease: Secondary | ICD-10-CM | POA: Diagnosis not present

## 2021-09-27 DIAGNOSIS — S61412A Laceration without foreign body of left hand, initial encounter: Secondary | ICD-10-CM | POA: Insufficient documentation

## 2021-09-27 DIAGNOSIS — E7849 Other hyperlipidemia: Secondary | ICD-10-CM | POA: Diagnosis not present

## 2021-09-27 DIAGNOSIS — M1990 Unspecified osteoarthritis, unspecified site: Secondary | ICD-10-CM | POA: Diagnosis not present

## 2021-09-27 DIAGNOSIS — S51812A Laceration without foreign body of left forearm, initial encounter: Secondary | ICD-10-CM | POA: Diagnosis present

## 2021-09-27 DIAGNOSIS — G319 Degenerative disease of nervous system, unspecified: Secondary | ICD-10-CM | POA: Diagnosis not present

## 2021-09-27 DIAGNOSIS — Z66 Do not resuscitate: Secondary | ICD-10-CM | POA: Diagnosis present

## 2021-09-27 DIAGNOSIS — Z9181 History of falling: Secondary | ICD-10-CM | POA: Diagnosis not present

## 2021-09-27 DIAGNOSIS — I120 Hypertensive chronic kidney disease with stage 5 chronic kidney disease or end stage renal disease: Secondary | ICD-10-CM | POA: Diagnosis not present

## 2021-09-27 DIAGNOSIS — Z87891 Personal history of nicotine dependence: Secondary | ICD-10-CM

## 2021-09-27 DIAGNOSIS — E876 Hypokalemia: Secondary | ICD-10-CM | POA: Diagnosis present

## 2021-09-27 DIAGNOSIS — F32A Depression, unspecified: Secondary | ICD-10-CM | POA: Diagnosis present

## 2021-09-27 DIAGNOSIS — R296 Repeated falls: Secondary | ICD-10-CM | POA: Diagnosis present

## 2021-09-27 DIAGNOSIS — R41841 Cognitive communication deficit: Secondary | ICD-10-CM | POA: Diagnosis not present

## 2021-09-27 DIAGNOSIS — R2689 Other abnormalities of gait and mobility: Secondary | ICD-10-CM | POA: Diagnosis not present

## 2021-09-27 DIAGNOSIS — K219 Gastro-esophageal reflux disease without esophagitis: Secondary | ICD-10-CM | POA: Diagnosis present

## 2021-09-27 DIAGNOSIS — F1021 Alcohol dependence, in remission: Secondary | ICD-10-CM | POA: Diagnosis present

## 2021-09-27 DIAGNOSIS — R197 Diarrhea, unspecified: Secondary | ICD-10-CM | POA: Diagnosis present

## 2021-09-27 DIAGNOSIS — Z8774 Personal history of (corrected) congenital malformations of heart and circulatory system: Secondary | ICD-10-CM

## 2021-09-27 DIAGNOSIS — I10 Essential (primary) hypertension: Secondary | ICD-10-CM | POA: Diagnosis present

## 2021-09-27 DIAGNOSIS — R531 Weakness: Secondary | ICD-10-CM | POA: Diagnosis not present

## 2021-09-27 DIAGNOSIS — Z992 Dependence on renal dialysis: Secondary | ICD-10-CM

## 2021-09-27 DIAGNOSIS — R42 Dizziness and giddiness: Secondary | ICD-10-CM | POA: Diagnosis not present

## 2021-09-27 DIAGNOSIS — I480 Paroxysmal atrial fibrillation: Secondary | ICD-10-CM | POA: Diagnosis present

## 2021-09-27 DIAGNOSIS — R0602 Shortness of breath: Secondary | ICD-10-CM | POA: Diagnosis not present

## 2021-09-27 DIAGNOSIS — Z7401 Bed confinement status: Secondary | ICD-10-CM | POA: Diagnosis not present

## 2021-09-27 DIAGNOSIS — I959 Hypotension, unspecified: Secondary | ICD-10-CM | POA: Diagnosis not present

## 2021-09-27 DIAGNOSIS — M6281 Muscle weakness (generalized): Secondary | ICD-10-CM | POA: Diagnosis not present

## 2021-09-27 DIAGNOSIS — T148XXA Other injury of unspecified body region, initial encounter: Secondary | ICD-10-CM | POA: Diagnosis not present

## 2021-09-27 DIAGNOSIS — I4891 Unspecified atrial fibrillation: Secondary | ICD-10-CM | POA: Diagnosis not present

## 2021-09-27 DIAGNOSIS — S6710XA Crushing injury of unspecified finger(s), initial encounter: Secondary | ICD-10-CM | POA: Diagnosis not present

## 2021-09-27 DIAGNOSIS — Z8249 Family history of ischemic heart disease and other diseases of the circulatory system: Secondary | ICD-10-CM

## 2021-09-27 DIAGNOSIS — M6282 Rhabdomyolysis: Secondary | ICD-10-CM | POA: Diagnosis not present

## 2021-09-27 DIAGNOSIS — Z880 Allergy status to penicillin: Secondary | ICD-10-CM

## 2021-09-27 DIAGNOSIS — M7989 Other specified soft tissue disorders: Secondary | ICD-10-CM | POA: Diagnosis not present

## 2021-09-27 DIAGNOSIS — W25XXXA Contact with sharp glass, initial encounter: Secondary | ICD-10-CM | POA: Insufficient documentation

## 2021-09-27 DIAGNOSIS — R000 Tachycardia, unspecified: Secondary | ICD-10-CM | POA: Diagnosis not present

## 2021-09-27 HISTORY — DX: Anxiety disorder, unspecified: F41.9

## 2021-09-27 LAB — CBC
HCT: 28.8 % — ABNORMAL LOW (ref 39.0–52.0)
Hemoglobin: 10.6 g/dL — ABNORMAL LOW (ref 13.0–17.0)
MCH: 33.5 pg (ref 26.0–34.0)
MCHC: 36.8 g/dL — ABNORMAL HIGH (ref 30.0–36.0)
MCV: 91.1 fL (ref 80.0–100.0)
Platelets: 132 10*3/uL — ABNORMAL LOW (ref 150–400)
RBC: 3.16 MIL/uL — ABNORMAL LOW (ref 4.22–5.81)
RDW: 13.3 % (ref 11.5–15.5)
WBC: 4 10*3/uL (ref 4.0–10.5)
nRBC: 0 % (ref 0.0–0.2)

## 2021-09-27 LAB — URINALYSIS, ROUTINE W REFLEX MICROSCOPIC
Bacteria, UA: NONE SEEN
Bilirubin Urine: NEGATIVE
Glucose, UA: NEGATIVE mg/dL
Ketones, ur: NEGATIVE mg/dL
Leukocytes,Ua: NEGATIVE
Nitrite: NEGATIVE
Protein, ur: NEGATIVE mg/dL
Specific Gravity, Urine: 1.005 (ref 1.005–1.030)
pH: 8 (ref 5.0–8.0)

## 2021-09-27 LAB — BASIC METABOLIC PANEL
Anion gap: 15 (ref 5–15)
Anion gap: 13 (ref 5–15)
BUN: 22 mg/dL (ref 8–23)
BUN: 25 mg/dL — ABNORMAL HIGH (ref 8–23)
CO2: 21 mmol/L — ABNORMAL LOW (ref 22–32)
CO2: 22 mmol/L (ref 22–32)
Calcium: 8.8 mg/dL — ABNORMAL LOW (ref 8.9–10.3)
Calcium: 8.3 mg/dL — ABNORMAL LOW (ref 8.9–10.3)
Chloride: 89 mmol/L — ABNORMAL LOW (ref 98–111)
Chloride: 88 mmol/L — ABNORMAL LOW (ref 98–111)
Creatinine, Ser: 3.04 mg/dL — ABNORMAL HIGH (ref 0.61–1.24)
Creatinine, Ser: 3.16 mg/dL — ABNORMAL HIGH (ref 0.61–1.24)
GFR, Estimated: 20 mL/min — ABNORMAL LOW (ref >60)
GFR, Estimated: 19 mL/min — ABNORMAL LOW (ref >60)
Glucose, Bld: 77 mg/dL (ref 70–99)
Glucose, Bld: 88 mg/dL (ref 70–99)
Potassium: 3.9 mmol/L (ref 3.5–5.1)
Potassium: 3.6 mmol/L (ref 3.5–5.1)
Sodium: 125 mmol/L — ABNORMAL LOW (ref 135–145)
Sodium: 123 mmol/L — ABNORMAL LOW (ref 135–145)

## 2021-09-27 LAB — ETHANOL: Alcohol, Ethyl (B): <10 mg/dL (ref <10)

## 2021-09-27 LAB — MAGNESIUM: Magnesium: 1.8 mg/dL (ref 1.7–2.4)

## 2021-09-27 MED ORDER — ACETAMINOPHEN 650 MG RE SUPP: 650.0000 mg | Freq: Four times a day (QID) | RECTAL | Status: DC | PRN

## 2021-09-27 MED ORDER — SODIUM CHLORIDE 0.9 % IV BOLUS
250.0000 mL | Freq: Once | INTRAVENOUS | Status: AC
  Administered 2021-09-27: 250 mL via INTRAVENOUS

## 2021-09-27 MED ORDER — LIDOCAINE-EPINEPHRINE (PF) 2 %-1:200000 IJ SOLN
10.0000 mL | Freq: Once | INTRAMUSCULAR | Status: DC
  Filled 2021-09-27: qty 20

## 2021-09-27 MED ORDER — ACETAMINOPHEN 325 MG PO TABS
650.0000 mg | ORAL_TABLET | Freq: Four times a day (QID) | ORAL | Status: DC | PRN
  Administered 2021-09-30: 650 mg via ORAL

## 2021-09-27 NOTE — ED Provider Notes (Signed)
[facility] EMERGENCY DEPARTMENT Provider Note   CSN: [id number] Arrival date & time: 09/27/21  2003     History  Chief Complaint  Patient presents with   Darryl Diaz is a 81 y.o. male.  Patient with complex medical history including high blood pressure, cardiac arrest, atrial fibrillation, heart failure, alcohol use, end-stage renal disease did not dialyze today due to falls presents with recurrent balance difficulty and another fall shortly after he got home today.  Patient fell this morning leading to emergency department visit where he had finger laceration repair.  Patient says he continues to feel off balance and feels like he is always get a fall.  Patient fell again without significant injury fortunately this time.  Patient denies any cough or significant shortness of breath.  No fevers or chills.  Patient denies new medications, patient has been on torsemide 3 times a day for some time.      Home Medications Prior to Admission medications   Medication Sig Start Date End Date Taking? Authorizing Provider  acetaminophen (TYLENOL) 325 MG tablet Take 2 tablets (650 mg total) by mouth every 4 (four) hours as needed for headache or mild pain. Patient taking differently: Take 325-650 mg by mouth every 4 (four) hours as needed for headache. 03/10/20   Angiulli, Lavon Paganini, PA-C  albuterol (PROVENTIL) (2.5 MG/3ML) 0.083% nebulizer solution Take 3 mLs (2.5 mg total) by nebulization every 6 (six) hours as needed for wheezing or shortness of breath. 06/14/21   Bonnielee Haff, MD  amiodarone (PACERONE) 200 MG tablet TAKE 1 TABLET BY MOUTH ONCE DAILY Patient taking differently: Take 200 mg by mouth every morning. 05/17/21   Richardson Dopp T, PA-C  atorvastatin (LIPITOR) 20 MG tablet Take 1 tablet (20 mg total) by mouth daily. Patient taking differently: Take 20 mg by mouth every morning. 03/10/20   Angiulli, Lavon Paganini, PA-C  carvedilol (COREG) 6.25 MG tablet Take 1  tablet (6.25 mg total) by mouth 2 (two) times daily. 02/11/21   Sherren Mocha, MD  cetirizine (ZYRTEC) 10 MG tablet Take 10 mg by mouth daily. Patient not taking: Reported on 06/08/2021    [provider]  escitalopram (LEXAPRO) 10 MG tablet Take 1 tablet (10 mg total) by mouth daily. Patient taking differently: Take 10 mg by mouth every morning. 03/10/20   Angiulli, Lavon Paganini, PA-C  ferrous sulfate 324 MG TBEC Take 324 mg by mouth every morning.    [provider]  FOLIC ACID PO Take 1 tablet by mouth every morning.    [provider]  HYDROcodone-acetaminophen (NORCO/VICODIN) 5-325 MG tablet Take 1 tablet by mouth every 4 (four) hours as needed (pain). 06/14/21   Bonnielee Haff, MD  Magnesium Oxide 400 MG CAPS Take 1 capsule (400 mg total) by mouth daily. Patient not taking: Reported on 06/08/2021 10/17/20   Eugenie Filler, MD  Multiple Vitamins-Minerals (CENTRUM SILVER 50+MEN) TABS Take 1 tablet by mouth every other day.    [provider]  nitroGLYCERIN (NITROSTAT) 0.4 MG SL tablet Place 1 tablet (0.4 mg total) under the tongue every 5 (five) minutes x 3 doses as needed for chest pain. 07/28/20   Baldwin Jamaica, PA-C  pantoprazole (PROTONIX) 40 MG tablet Take 1 tablet (40 mg total) by mouth daily. Patient taking differently: Take 40 mg by mouth every morning. 03/10/20   Angiulli, Lavon Paganini, PA-C  sodium bicarbonate 650 MG tablet Take 1 tablet (650 mg total) by mouth  2 (two) times daily. Patient not taking: Reported on 06/08/2021 01/22/21   Antonieta Pert, MD  tamsulosin (FLOMAX) 0.4 MG CAPS capsule Take 0.4 mg by mouth at bedtime as needed (frequent urination). 06/01/21   [provider]  torsemide (DEMADEX) 20 MG tablet Take 60 mg by mouth See admin instructions. Take 3 tablets (60 mg) by mouth every morning, may also take 3 tablets (60 mg) in the afternoon as needed for swelling    [provider]  traZODone (DESYREL) 50 MG tablet Take 1 tablet  (50 mg total) by mouth at bedtime. Patient taking differently: Take 50 mg by mouth at bedtime as needed for sleep. 03/10/20   Angiulli, Lavon Paganini, PA-C  vitamin B-12 (CYANOCOBALAMIN) 500 MCG tablet Take 1 tablet (500 mcg total) by mouth daily. 06/11/21   Bonnielee Haff, MD      Allergies    Penicillins    Review of Systems   Review of Systems  Constitutional:  Negative for chills and fever.  HENT:  Negative for congestion.   Eyes:  Negative for visual disturbance.  Respiratory:  Negative for shortness of breath.   Cardiovascular:  Negative for chest pain.  Gastrointestinal:  Negative for abdominal pain and vomiting.  Genitourinary:  Negative for dysuria and flank pain.  Musculoskeletal:  Negative for back pain, neck pain and neck stiffness.  Skin:  Negative for rash.  Neurological:  Positive for dizziness and weakness. Negative for light-headedness and headaches.   Physical Exam Updated Vital Signs BP (!) 157/108 (BP Location: Right Arm)   Pulse 76   Temp 98.5 F (36.9 C) (Oral)   Resp 20   SpO2 100%  Physical Exam Vitals and nursing note reviewed.  Constitutional:      General: He is not in acute distress.    Appearance: He is well-developed.  HENT:     Head: Normocephalic and atraumatic.     Mouth/Throat:     Mouth: Mucous membranes are moist.  Eyes:     General:        Right eye: No discharge.        Left eye: No discharge.     Conjunctiva/sclera: Conjunctivae normal.  Neck:     Trachea: No tracheal deviation.  Cardiovascular:     Rate and Rhythm: Normal rate and regular rhythm.     Heart sounds: No murmur heard. Pulmonary:     Effort: Pulmonary effort is normal.     Breath sounds: Normal breath sounds.  Abdominal:     General: There is no distension.     Palpations: Abdomen is soft.     Tenderness: There is no abdominal tenderness. There is no guarding.  Musculoskeletal:        General: Swelling (2+ bilateral LE) present.     Cervical back: Normal range of  motion and neck supple. No rigidity.  Skin:    General: Skin is warm.     Capillary Refill: Capillary refill takes less than 2 seconds.  Neurological:     General: No focal deficit present.     Mental Status: He is alert.     Cranial Nerves: No cranial nerve deficit.     Comments: Extraocular muscle function intact.  General weakness, equal bilateral.  Finger-nose intact.  Psychiatric:        Mood and Affect: Mood normal.    ED Results / Procedures / Treatments   Labs (all labs ordered are listed, but only abnormal results are displayed) Labs Reviewed - No  data to display  EKG None  Radiology CT Head Wo Contrast  Result Date: 09/27/2021 CLINICAL DATA:  Head trauma, minor (Age >= 65y) EXAM: CT HEAD WITHOUT CONTRAST TECHNIQUE: Contiguous axial images were obtained from the base of the skull through the vertex without intravenous contrast. RADIATION DOSE REDUCTION: This exam was performed according to the departmental dose-optimization program which includes automated exposure control, adjustment of the mA and/or kV according to patient size and/or use of iterative reconstruction technique. COMPARISON:  None Available. FINDINGS: Brain: No evidence of acute infarction, hemorrhage, hydrocephalus, extra-axial collection or mass lesion/mass effect. Cerebral atrophy. Vascular: Calcific intracranial atherosclerosis. No hyperdense vessel identified. Skull: No acute fracture. Sinuses/Orbits: Visualized sinuses are clear. No acute orbital findings. Other: Trace left mastoid effusion. IMPRESSION: 1. No evidence of acute intracranial abnormality. 2. 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 Electronically Signed   By: Margaretha Sheffield M.D.   On: 09/27/2021 11:56   DG Chest Port 1 View  Result Date: 09/27/2021 CLINICAL DATA:  Shortness of breath EXAM: PORTABLE CHEST 1 VIEW COMPARISON:  Previous studies including the examination of 06/07/2021 FINDINGS: Transverse diameter of heart is increased. There are linear  densities in the lower lung fields. There are no signs of pulmonary edema or focal pulmonary consolidation. There is no pleural effusion or pneumothorax. IMPRESSION: Cardiomegaly. Linear densities in the lower lung fields suggest scarring or subsegmental atelectasis. There are no signs of pulmonary edema or focal pulmonary consolidation. Electronically Signed   By: Elmer Picker M.D.   On: 09/27/2021 14:06   DG Hand Complete Left  Result Date: 09/27/2021 CLINICAL DATA:  Trauma, fall EXAM: LEFT HAND - COMPLETE 3+ VIEW COMPARISON:  None Available. FINDINGS: No recent fracture or dislocation is seen. Marked degenerative changes are noted in first carpometacarpal joint. Degenerative changes are noted in multiple interphalangeal joints. Soft tissue swelling is noted adjacent to the interphalangeal joints, more so in the PIP joint of the middle finger. There are no opaque foreign bodies. IMPRESSION: No recent fracture or dislocation is seen. Degenerative changes are noted in multiple joints as described in the body of the report. Electronically Signed   By: Elmer Picker M.D.   On: 09/27/2021 14:09    Procedures Procedures    Medications Ordered in ED Medications  sodium chloride 0.9 % bolus 250 mL (has no administration in time range)    ED Course/ Medical Decision Making/ A&P                           Medical Decision Making Amount and/or Complexity of Data Reviewed Labs: ordered.   Patient presents with frequent falls, general weakness and second visit to the emergency department.  Reviewed medical records and patient was here this morning approximately 10 in the morning and had general work-up done x-ray of the hand without fracture, suture repair, CT scan of the head without acute abnormality reviewed and chest x-ray reviewed independently without pulmonary edema.  Patient has no focal neurodeficits however has balance difficulty with ambulation.  Blood work reviewed showing  significant hyponatremia 125 previously was in the 130s, chloride 89, bicarb 21, creatinine 3.04, calcium 8.8 and hemoglobin stable 10.6.  Ethanol, urinalysis added to work-up from this morning.  Patient symptoms likely second to hyponatremia, other differentials include posterior stroke patient will be out of any time window.  Paged hospitalist for admission.  Small IV fluid bolus 2050 cc ordered to start.         Final Clinical  Impression(s) / ED Diagnoses Final diagnoses:  Hyponatremia  Frequent falls  General weakness  Balance problem    Rx / DC Orders ED Discharge Orders     None         Elnora Morrison, MD 09/27/21 2144

## 2021-09-27 NOTE — ED Provider Triage Note (Signed)
Emergency Medicine Provider Triage Evaluation Note  Darryl Diaz , a 81 y.o. male  was evaluated in triage.  Pt complains of fall earlier this morning. States he just "lost his balance" and fell onto his left shoulder in his kitchen. Does not believe he tripped on something, but did not have chest pain or lightheadedness before the fall. Denies head trauma or LOC. Is not on blood thinners. Endorses cut on his left hand between 2nd and 3rd fingers. Not complaining of any pain.   Review of Systems  Positive: As above Negative: Chest pain, sob, dizziness  Physical Exam  BP 137/63 (BP Location: Right Arm)   Pulse (!) 59   Temp 97.6 F (36.4 C) (Oral)   Resp 16   SpO2 100%  Gen:   Awake, no distress   Resp:  Normal effort  MSK:   Moves extremities without difficulty  Other:  Superficial laceration between left 2nd and 3rd fingers, bleeding controlled  Medical Decision Making  Medically screening exam initiated at 10:42 AM.  Appropriate orders placed.  Darryl Diaz was informed that the remainder of the evaluation will be completed by another provider, this initial triage assessment does not replace that evaluation, and the importance of remaining in the ED until their evaluation is complete.     Khalin Royce T, PA-C 09/27/21 1045

## 2021-09-27 NOTE — Progress Notes (Addendum)
Contacted by ED provider regarding pt's need for out-pt HD tomorrow off schedule due to pt missing clinic appt today. Contacted [facility] and spoke to [place], Therapist, sports. Pt can receive treatment at clinic tomorrow. Pt will need to arrive at clinic tomorrow at 9:30 for 10:00 chair time. Thelma advised to expect pt at this appt time tomorrow. Advised by clinic that pt uses [facility] non-medicaid transportation for HD appts. Contacted transportation [phone]) and spoke to [place]. Appt made for transportation to pick pt up tomorrow morning between 8:10 -8:50 to have pt at clinic by 9:30. Above information provided to ED provider who will provide to pt. Attempted to reach pt at mobile number listed but was unsuccessful and unable to leave a message. Above appt details added to pt's AVS.   Melven Sartorius Renal Navigator [phone]  Addendum at 2:12 pm: Attempted to call pt again on mobile number. There was no answer and unable to leave a message. Spoke to pt's sister, [name], via phone. Sister advised of above details and sister voiced understanding. Sister advised information will be placed on pt's AVS as well.

## 2021-09-27 NOTE — ED Triage Notes (Signed)
Patient from home, was just discharged from here this afternoon.  Patient was dizzy and fell.  This is the second time today.  Patient did not hit his head and not on blood thinners.  Patient received stitches on left hand between pinky and ring fingers and has pulled on of the stitches.  Patient missed dialysis today but has an appt in the morning for dialysis.

## 2021-09-27 NOTE — ED Provider Notes (Signed)
[facility] EMERGENCY DEPARTMENT Provider Note   CSN: [id number] Arrival date & time: 09/27/21  1031     History  Chief Complaint  Patient presents with   Darryl Diaz is a 81 y.o. male.  Patient is an 81 year old male with multiple medical problems including hypertension, atrial fibrillation, recurrent syncope, chronic diastolic CHF, ascending aortic dilation, alcoholic cirrhosis, end-stage renal disease on dialysis Monday Wednesday Friday who is presenting today after a fall at home.  Patient reports that he was in his kitchen and he lost his balance falling forward onto the floor.  He was holding a glass in his hand and it cut his finger.  He did not hit his head or lose consciousness.  He did not have any dizziness, palpitations, chest pain or sneak near syncope causing the fall.  He reports that he lives alone and does lose his balance.  This happens intermittently.  He reports that his last dialysis treatment was on Friday full course.  He has had swelling in his legs and has intermittently had some shortness of breath and thinks it might be a little worse than baseline.  He does take torsemide daily and is not sure if the swelling in his left leg is worse than baseline.  The history is provided by the patient and the EMS personnel.  Fall      Home Medications Prior to Admission medications   Medication Sig Start Date End Date Taking? Authorizing Provider  acetaminophen (TYLENOL) 325 MG tablet Take 2 tablets (650 mg total) by mouth every 4 (four) hours as needed for headache or mild pain. Patient taking differently: Take 325-650 mg by mouth every 4 (four) hours as needed for headache. 03/10/20   Angiulli, Lavon Paganini, PA-C  albuterol (PROVENTIL) (2.5 MG/3ML) 0.083% nebulizer solution Take 3 mLs (2.5 mg total) by nebulization every 6 (six) hours as needed for wheezing or shortness of breath. 06/14/21   Bonnielee Haff, MD  amiodarone (PACERONE) 200 MG  tablet TAKE 1 TABLET BY MOUTH ONCE DAILY Patient taking differently: Take 200 mg by mouth every morning. 05/17/21   Richardson Dopp T, PA-C  atorvastatin (LIPITOR) 20 MG tablet Take 1 tablet (20 mg total) by mouth daily. Patient taking differently: Take 20 mg by mouth every morning. 03/10/20   Angiulli, Lavon Paganini, PA-C  carvedilol (COREG) 6.25 MG tablet Take 1 tablet (6.25 mg total) by mouth 2 (two) times daily. 02/11/21   Sherren Mocha, MD  cetirizine (ZYRTEC) 10 MG tablet Take 10 mg by mouth daily. Patient not taking: Reported on 06/08/2021    [provider]  escitalopram (LEXAPRO) 10 MG tablet Take 1 tablet (10 mg total) by mouth daily. Patient taking differently: Take 10 mg by mouth every morning. 03/10/20   Angiulli, Lavon Paganini, PA-C  ferrous sulfate 324 MG TBEC Take 324 mg by mouth every morning.    [provider]  FOLIC ACID PO Take 1 tablet by mouth every morning.    [provider]  HYDROcodone-acetaminophen (NORCO/VICODIN) 5-325 MG tablet Take 1 tablet by mouth every 4 (four) hours as needed (pain). 06/14/21   Bonnielee Haff, MD  Magnesium Oxide 400 MG CAPS Take 1 capsule (400 mg total) by mouth daily. Patient not taking: Reported on 06/08/2021 10/17/20   Eugenie Filler, MD  Multiple Vitamins-Minerals (CENTRUM SILVER 50+MEN) TABS Take 1 tablet by mouth every other day.    [provider]  nitroGLYCERIN (NITROSTAT) 0.4 MG SL tablet Place  1 tablet (0.4 mg total) under the tongue every 5 (five) minutes x 3 doses as needed for chest pain. 07/28/20   Baldwin Jamaica, PA-C  pantoprazole (PROTONIX) 40 MG tablet Take 1 tablet (40 mg total) by mouth daily. Patient taking differently: Take 40 mg by mouth every morning. 03/10/20   Angiulli, Lavon Paganini, PA-C  sodium bicarbonate 650 MG tablet Take 1 tablet (650 mg total) by mouth 2 (two) times daily. Patient not taking: Reported on 06/08/2021 01/22/21   Antonieta Pert, MD  tamsulosin (FLOMAX) 0.4 MG CAPS capsule Take 0.4 mg by  mouth at bedtime as needed (frequent urination). 06/01/21   [provider]  torsemide (DEMADEX) 20 MG tablet Take 60 mg by mouth See admin instructions. Take 3 tablets (60 mg) by mouth every morning, may also take 3 tablets (60 mg) in the afternoon as needed for swelling    [provider]  traZODone (DESYREL) 50 MG tablet Take 1 tablet (50 mg total) by mouth at bedtime. Patient taking differently: Take 50 mg by mouth at bedtime as needed for sleep. 03/10/20   Angiulli, Lavon Paganini, PA-C  vitamin B-12 (CYANOCOBALAMIN) 500 MCG tablet Take 1 tablet (500 mcg total) by mouth daily. 06/11/21   Bonnielee Haff, MD      Allergies    Penicillins    Review of Systems   Review of Systems  Physical Exam Updated Vital Signs BP (!) 167/68 (BP Location: Right Arm)   Pulse 63   Temp 97.6 F (36.4 C) (Oral)   Resp 15   SpO2 100%  Physical Exam Vitals and nursing note reviewed.  Constitutional:      General: He is not in acute distress.    Appearance: He is well-developed.  HENT:     Head: Normocephalic and atraumatic.  Eyes:     Conjunctiva/sclera: Conjunctivae normal.     Pupils: Pupils are equal, round, and reactive to light.  Cardiovascular:     Rate and Rhythm: Regular rhythm. Bradycardia present.     Heart sounds: No murmur heard. Pulmonary:     Effort: Pulmonary effort is normal. No respiratory distress.     Breath sounds: Normal breath sounds. No wheezing or rales.  Abdominal:     General: There is no distension.     Palpations: Abdomen is soft.     Tenderness: There is no abdominal tenderness. There is no guarding or rebound.  Musculoskeletal:        General: No tenderness. Normal range of motion.       Hands:     Cervical back: Normal range of motion and neck supple. No tenderness.     Right lower leg: Edema present.     Left lower leg: Edema present.     Comments: 2+ pitting edema in the left lower extremity.  1+ in the right lower extremity.  Skin tears noted to  the left forearm.  Full flexion and extension of all 5 fingers on the left hand normal at the MCP, PIP and DIP joints of fingers 2 through 5  Skin:    General: Skin is warm and dry.     Findings: No erythema or rash.  Neurological:     Mental Status: He is alert and oriented to person, place, and time.  Psychiatric:        Mood and Affect: Mood normal.        Behavior: Behavior normal.    ED Results / Procedures / Treatments   Labs (all  labs ordered are listed, but only abnormal results are displayed) Labs Reviewed  BASIC METABOLIC PANEL - Abnormal; Notable for the following components:      Result Value   Sodium 125 (*)    Chloride 89 (*)    CO2 21 (*)    Creatinine, Ser 3.04 (*)    Calcium 8.8 (*)    GFR, Estimated 20 (*)    All other components within normal limits  CBC - Abnormal; Notable for the following components:   RBC 3.16 (*)    Hemoglobin 10.6 (*)    HCT 28.8 (*)    MCHC 36.8 (*)    Platelets 132 (*)    All other components within normal limits  URINALYSIS, ROUTINE W REFLEX MICROSCOPIC  CBG MONITORING, ED    EKG None  Radiology CT Head Wo Contrast  Result Date: 09/27/2021 CLINICAL DATA:  Head trauma, minor (Age >= 65y) EXAM: CT HEAD WITHOUT CONTRAST TECHNIQUE: Contiguous axial images were obtained from the base of the skull through the vertex without intravenous contrast. RADIATION DOSE REDUCTION: This exam was performed according to the departmental dose-optimization program which includes automated exposure control, adjustment of the mA and/or kV according to patient size and/or use of iterative reconstruction technique. COMPARISON:  None Available. FINDINGS: Brain: No evidence of acute infarction, hemorrhage, hydrocephalus, extra-axial collection or mass lesion/mass effect. Cerebral atrophy. Vascular: Calcific intracranial atherosclerosis. No hyperdense vessel identified. Skull: No acute fracture. Sinuses/Orbits: Visualized sinuses are clear. No acute orbital  findings. Other: Trace left mastoid effusion. IMPRESSION: 1. No evidence of acute intracranial abnormality. 2. 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 Electronically Signed   By: Margaretha Sheffield M.D.   On: 09/27/2021 11:56   DG Chest Port 1 View  Result Date: 09/27/2021 CLINICAL DATA:  Shortness of breath EXAM: PORTABLE CHEST 1 VIEW COMPARISON:  Previous studies including the examination of 06/07/2021 FINDINGS: Transverse diameter of heart is increased. There are linear densities in the lower lung fields. There are no signs of pulmonary edema or focal pulmonary consolidation. There is no pleural effusion or pneumothorax. IMPRESSION: Cardiomegaly. Linear densities in the lower lung fields suggest scarring or subsegmental atelectasis. There are no signs of pulmonary edema or focal pulmonary consolidation. Electronically Signed   By: Elmer Picker M.D.   On: 09/27/2021 14:06   DG Hand Complete Left  Result Date: 09/27/2021 CLINICAL DATA:  Trauma, fall EXAM: LEFT HAND - COMPLETE 3+ VIEW COMPARISON:  None Available. FINDINGS: No recent fracture or dislocation is seen. Marked degenerative changes are noted in first carpometacarpal joint. Degenerative changes are noted in multiple interphalangeal joints. Soft tissue swelling is noted adjacent to the interphalangeal joints, more so in the PIP joint of the middle finger. There are no opaque foreign bodies. IMPRESSION: No recent fracture or dislocation is seen. Degenerative changes are noted in multiple joints as described in the body of the report. Electronically Signed   By: Elmer Picker M.D.   On: 09/27/2021 14:09    Procedures Procedures   LACERATION REPAIR Performed by: [employer] Authorized by: Blanchie Dessert Consent: Verbal consent obtained. Risks and benefits: risks, benefits and alternatives were discussed Consent given by: patient Patient identity confirmed: provided demographic data Prepped and Draped in normal sterile  fashion Wound explored  Laceration Location: left hand  Laceration Length: 1cm  No Foreign Bodies seen or palpated  Anesthesia: local infiltration  Local anesthetic: lidocaine 2% with epinephrine  Anesthetic total: 4 ml  Irrigation method: syringe Amount of cleaning: standard  Skin closure: 4.0  vicryl rapide  Number of sutures: 3  Technique: simple interrupted  Patient tolerance: Patient tolerated the procedure well with no immediate complications.   Medications Ordered in ED Medications  lidocaine-EPINEPHrine (XYLOCAINE W/EPI) 2 %-1:200000 (PF) injection 10 mL (has no administration in time range)    ED Course/ Medical Decision Making/ A&P                           Medical Decision Making Amount and/or Complexity of Data Reviewed Radiology: ordered.  Risk Prescription drug management.   Pt with multiple medical problems and comorbidities and presenting today with a complaint that caries a high risk for morbidity and mortality.  Here today with a fall.  He reports he lost his balance and fell but denies any preceding symptoms.  Low suspicion for syncope at this time.  Patient is well-appearing on exam.  Reassuring vital signs.  He has had what he reports is a little worse intermittent shortness of breath but not constant and maybe a little worse swelling in his left lower extremity.  His last dialysis was on Friday and he was supposed to dialyze today but missed it because he fell.  He reports after EMS helped him up he was able to ambulate and felt okay.  He takes no anticoagulation due to prior falls.  He is in no acute distress at this time.  He did not injure his head or lose consciousness.  Do not feel that patient warrants CT imaging of the head today as there is no evidence of trauma.  Because he has complained of intermittent episodes of some shortness of breath will do a chest x-ray but lungs sound clear today and sats are 100% on room air.  I independently  interpreted patient's labs today and he does have a hyponatremia of 125 but normal potassium and labs consistent with end-stage renal disease.  CBC with stable hemoglobin and normal white count. I have independently visualized and interpreted pt's images today. Head CT which was done in triage is negative for acute bleed.  Radiology reports cerebellar atrophy.  Plain film of the left hand shows no evidence of retained foreign body and wound repaired as above.  CXR without fluid overload. Spoke with renal coordinator and patient can go to dialysis tomorrow which will most likely improve his hyponatremia.  Findings were discussed with the patient.  He feels comfortable going home.  Transportation worse worked out for [employer].         Final Clinical Impression(s) / ED Diagnoses Final diagnoses:  Fall, initial encounter  Laceration of left hand, foreign body presence unspecified, initial encounter  Hyponatremia    Rx / DC Orders ED Discharge Orders     None         Blanchie Dessert, MD 09/27/21 1452

## 2021-09-27 NOTE — Discharge Instructions (Signed)
So the transport will be at your house tomorrow sometime after 8:10 to take you to dialysis.  They will most likely be there closer to 8:30.  The stitches in your finger will dissolve and you do not have to have them removed.  Because your sodium level is a little bit low today you will need dialysis tomorrow.  There is no sign of broken bones or injury otherwise.  All your scans look good.

## 2021-09-27 NOTE — ED Triage Notes (Signed)
Patient BIB [facility] from home, complaint of ground level fall this morning. Pt was holding a glass in his hand that broke upon falling. Pt with laceration between left ring finger and pinky. VSS. NAD. Pt is dialysis pt, MWF.

## 2021-09-28 ENCOUNTER — Encounter (HOSPITAL_COMMUNITY): Payer: Self-pay | Admitting: Internal Medicine

## 2021-09-28 DIAGNOSIS — E871 Hypo-osmolality and hyponatremia: Secondary | ICD-10-CM | POA: Diagnosis not present

## 2021-09-28 DIAGNOSIS — Z8774 Personal history of (corrected) congenital malformations of heart and circulatory system: Secondary | ICD-10-CM

## 2021-09-28 LAB — TYPE AND SCREEN
ABO/RH(D): O POS
Antibody Screen: NEGATIVE

## 2021-09-28 LAB — CBC WITH DIFFERENTIAL/PLATELET
Abs Immature Granulocytes: 0.02 10*3/uL (ref 0.00–0.07)
Abs Immature Granulocytes: 0.01 10*3/uL (ref 0.00–0.07)
Basophils Absolute: 0 10*3/uL (ref 0.0–0.1)
Basophils Absolute: 0 10*3/uL (ref 0.0–0.1)
Basophils Relative: 1 %
Basophils Relative: 1 %
Eosinophils Absolute: 0 10*3/uL (ref 0.0–0.5)
Eosinophils Absolute: 0 10*3/uL (ref 0.0–0.5)
Eosinophils Relative: 1 %
Eosinophils Relative: 1 %
HCT: 26.5 % — ABNORMAL LOW (ref 39.0–52.0)
HCT: 29.8 % — ABNORMAL LOW (ref 39.0–52.0)
Hemoglobin: 9.8 g/dL — ABNORMAL LOW (ref 13.0–17.0)
Hemoglobin: 10.8 g/dL — ABNORMAL LOW (ref 13.0–17.0)
Immature Granulocytes: 1 %
Immature Granulocytes: 0 %
Lymphocytes Relative: 18 %
Lymphocytes Relative: 15 %
Lymphs Abs: 0.7 10*3/uL (ref 0.7–4.0)
Lymphs Abs: 0.6 10*3/uL — ABNORMAL LOW (ref 0.7–4.0)
MCH: 33.2 pg (ref 26.0–34.0)
MCH: 33.4 pg (ref 26.0–34.0)
MCHC: 37 g/dL — ABNORMAL HIGH (ref 30.0–36.0)
MCHC: 36.2 g/dL — ABNORMAL HIGH (ref 30.0–36.0)
MCV: 89.8 fL (ref 80.0–100.0)
MCV: 92.3 fL (ref 80.0–100.0)
Monocytes Absolute: 0.5 10*3/uL (ref 0.1–1.0)
Monocytes Absolute: 0.4 10*3/uL (ref 0.1–1.0)
Monocytes Relative: 12 %
Monocytes Relative: 11 %
Neutro Abs: 2.6 10*3/uL (ref 1.7–7.7)
Neutro Abs: 2.7 10*3/uL (ref 1.7–7.7)
Neutrophils Relative %: 67 %
Neutrophils Relative %: 72 %
Platelets: 127 10*3/uL — ABNORMAL LOW (ref 150–400)
Platelets: 122 10*3/uL — ABNORMAL LOW (ref 150–400)
RBC: 2.95 MIL/uL — ABNORMAL LOW (ref 4.22–5.81)
RBC: 3.23 MIL/uL — ABNORMAL LOW (ref 4.22–5.81)
RDW: 13.6 % (ref 11.5–15.5)
RDW: 13.8 % (ref 11.5–15.5)
WBC: 3.8 10*3/uL — ABNORMAL LOW (ref 4.0–10.5)
WBC: 3.8 10*3/uL — ABNORMAL LOW (ref 4.0–10.5)
nRBC: 0 % (ref 0.0–0.2)
nRBC: 0 % (ref 0.0–0.2)

## 2021-09-28 LAB — MAGNESIUM: Magnesium: 1.8 mg/dL (ref 1.7–2.4)

## 2021-09-28 LAB — SODIUM, URINE, RANDOM: Sodium, Ur: 35 mmol/L

## 2021-09-28 LAB — BASIC METABOLIC PANEL
Anion gap: 12 (ref 5–15)
BUN: 27 mg/dL — ABNORMAL HIGH (ref 8–23)
CO2: 23 mmol/L (ref 22–32)
Calcium: 8.7 mg/dL — ABNORMAL LOW (ref 8.9–10.3)
Chloride: 93 mmol/L — ABNORMAL LOW (ref 98–111)
Creatinine, Ser: 3.34 mg/dL — ABNORMAL HIGH (ref 0.61–1.24)
GFR, Estimated: 18 mL/min — ABNORMAL LOW (ref >60)
Glucose, Bld: 122 mg/dL — ABNORMAL HIGH (ref 70–99)
Potassium: 3.1 mmol/L — ABNORMAL LOW (ref 3.5–5.1)
Sodium: 128 mmol/L — ABNORMAL LOW (ref 135–145)

## 2021-09-28 LAB — COMPREHENSIVE METABOLIC PANEL
ALT: 27 U/L (ref 0–44)
AST: 42 U/L — ABNORMAL HIGH (ref 15–41)
Albumin: 3.1 g/dL — ABNORMAL LOW (ref 3.5–5.0)
Alkaline Phosphatase: 149 U/L — ABNORMAL HIGH (ref 38–126)
Anion gap: 13 (ref 5–15)
BUN: 26 mg/dL — ABNORMAL HIGH (ref 8–23)
CO2: 23 mmol/L (ref 22–32)
Calcium: 8.6 mg/dL — ABNORMAL LOW (ref 8.9–10.3)
Chloride: 93 mmol/L — ABNORMAL LOW (ref 98–111)
Creatinine, Ser: 3.28 mg/dL — ABNORMAL HIGH (ref 0.61–1.24)
GFR, Estimated: 18 mL/min — ABNORMAL LOW (ref >60)
Glucose, Bld: 85 mg/dL (ref 70–99)
Potassium: 3.6 mmol/L (ref 3.5–5.1)
Sodium: 129 mmol/L — ABNORMAL LOW (ref 135–145)
Total Bilirubin: 1.8 mg/dL — ABNORMAL HIGH (ref 0.3–1.2)
Total Protein: 5.3 g/dL — ABNORMAL LOW (ref 6.5–8.1)

## 2021-09-28 LAB — OSMOLALITY, URINE: Osmolality, Ur: 177 mOsm/kg — ABNORMAL LOW (ref 300–900)

## 2021-09-28 LAB — CREATININE, URINE, RANDOM: Creatinine, Urine: 40.7 mg/dL

## 2021-09-28 LAB — URIC ACID: Uric Acid, Serum: 5.5 mg/dL (ref 3.7–8.6)

## 2021-09-28 LAB — HEPATITIS B SURFACE ANTIBODY,QUALITATIVE: Hep B S Ab: NONREACTIVE

## 2021-09-28 LAB — OSMOLALITY: Osmolality: 266 mOsm/kg — ABNORMAL LOW (ref 275–295)

## 2021-09-28 LAB — HEPATITIS B SURFACE ANTIGEN: Hepatitis B Surface Ag: NONREACTIVE

## 2021-09-28 LAB — PHOSPHORUS: Phosphorus: 3.1 mg/dL (ref 2.5–4.6)

## 2021-09-28 LAB — PROTIME-INR
INR: 1.2 (ref 0.8–1.2)
Prothrombin Time: 14.7 seconds (ref 11.4–15.2)

## 2021-09-28 LAB — BRAIN NATRIURETIC PEPTIDE: B Natriuretic Peptide: 647.5 pg/mL — ABNORMAL HIGH (ref 0.0–100.0)

## 2021-09-28 LAB — TSH: TSH: 4.189 u[IU]/mL (ref 0.350–4.500)

## 2021-09-28 LAB — OCCULT BLOOD X 1 CARD TO LAB, STOOL: Fecal Occult Bld: NEGATIVE

## 2021-09-28 MED ORDER — FOLIC ACID 1 MG PO TABS
1.0000 mg | ORAL_TABLET | Freq: Every day | ORAL | Status: DC
  Administered 2021-09-28 – 2021-09-30 (×3): 1 mg via ORAL
  Filled 2021-09-28 (×3): qty 1

## 2021-09-28 MED ORDER — CHLORHEXIDINE GLUCONATE CLOTH 2 % EX PADS
6.0000 | MEDICATED_PAD | Freq: Every day | CUTANEOUS | Status: DC
  Administered 2021-09-28: 6 via TOPICAL

## 2021-09-28 MED ORDER — TRAZODONE HCL 50 MG PO TABS
50.0000 mg | ORAL_TABLET | Freq: Every day | ORAL | Status: DC
  Administered 2021-09-28 – 2021-09-29 (×2): 50 mg via ORAL
  Filled 2021-09-28 (×2): qty 1

## 2021-09-28 MED ORDER — VITAMIN B-12 1000 MCG PO TABS
500.0000 ug | ORAL_TABLET | Freq: Every day | ORAL | Status: DC
  Administered 2021-09-28 – 2021-09-30 (×3): 500 ug via ORAL
  Filled 2021-09-28 (×3): qty 1

## 2021-09-28 MED ORDER — AMIODARONE HCL 200 MG PO TABS
200.0000 mg | ORAL_TABLET | Freq: Every morning | ORAL | Status: DC
  Administered 2021-09-28 – 2021-09-30 (×3): 200 mg via ORAL
  Filled 2021-09-28 (×3): qty 1

## 2021-09-28 MED ORDER — CARVEDILOL 6.25 MG PO TABS
6.2500 mg | ORAL_TABLET | Freq: Two times a day (BID) | ORAL | Status: DC
  Administered 2021-09-28 – 2021-09-30 (×5): 6.25 mg via ORAL
  Filled 2021-09-28 (×5): qty 1

## 2021-09-28 MED ORDER — CHLORHEXIDINE GLUCONATE CLOTH 2 % EX PADS
6.0000 | MEDICATED_PAD | Freq: Every day | CUTANEOUS | Status: DC
  Administered 2021-09-29 – 2021-09-30 (×2): 6 via TOPICAL

## 2021-09-28 MED ORDER — FERROUS SULFATE 325 (65 FE) MG PO TABS
325.0000 mg | ORAL_TABLET | Freq: Every day | ORAL | Status: DC
  Administered 2021-09-28 – 2021-09-30 (×3): 325 mg via ORAL
  Filled 2021-09-28 (×3): qty 1

## 2021-09-28 MED ORDER — PANTOPRAZOLE SODIUM 40 MG PO TBEC
40.0000 mg | DELAYED_RELEASE_TABLET | Freq: Every day | ORAL | Status: DC
  Administered 2021-09-29 – 2021-09-30 (×2): 40 mg via ORAL
  Filled 2021-09-28 (×2): qty 1

## 2021-09-28 MED ORDER — ESCITALOPRAM OXALATE 10 MG PO TABS
10.0000 mg | ORAL_TABLET | Freq: Every day | ORAL | Status: DC
  Administered 2021-09-29 – 2021-09-30 (×2): 10 mg via ORAL
  Filled 2021-09-28 (×2): qty 1

## 2021-09-28 MED ORDER — SODIUM CHLORIDE 0.9 % IV SOLN
125.0000 mg | INTRAVENOUS | Status: DC
  Administered 2021-09-29: 125 mg via INTRAVENOUS
  Filled 2021-09-28: qty 10

## 2021-09-28 MED ORDER — ATORVASTATIN CALCIUM 10 MG PO TABS
20.0000 mg | ORAL_TABLET | Freq: Every morning | ORAL | Status: DC
  Administered 2021-09-28 – 2021-09-30 (×3): 20 mg via ORAL
  Filled 2021-09-28 (×3): qty 2

## 2021-09-28 NOTE — Assessment & Plan Note (Deleted)
 #)   ESRD: on HD (schedule: Monday, Wednesday, Friday).  He missed his most recent scheduled hemodialysis session on Monday, 09/27/2021.  Presentation suggestive of mild volume overload, but without any associated respiratory distress.  Additionally, presenting labs demonstrate no evidence of hypokalemia nor any evidence of anion gap metabolic acidosis, while the patient also does not demonstrate evidence consistent with uremia.  Consequently, there did not appear to be any indications for urgent overnight hemodialysis.   Plan: monitor strict I's/O's, daily weights. CMP in the AM. Check mag and phos levels.  Anticipate consultation of nephrology in the morning for assistance with arrangement for nonemergent hemodialysis.

## 2021-09-28 NOTE — Assessment & Plan Note (Addendum)
Essential hypertension. On amiodarone and carvedilol.  Not on anticoagulation due to recurrent fall as well as GI bleed history. Currently rate controlled.  Monitor.

## 2021-09-28 NOTE — Assessment & Plan Note (Signed)
Baseline serum sodium more than 130.  Present to the hospital with sodium of 125 with a fall. Management per nephrology with HD.

## 2021-09-28 NOTE — H&P (Signed)
History and Physical    PLEASE NOTE THAT DRAGON DICTATION SOFTWARE WAS USED IN THE CONSTRUCTION OF THIS NOTE.   Darryl Diaz PYK:[id number] DOB: Dec 31, 1940 DOA: 09/27/2021  PCP: [name], MD  Patient coming from: home   I have personally briefly reviewed patient's old medical records in [place]  Chief Complaint: Generalized weakness  HPI: Darryl Diaz is a 81 y.o. male with medical history significant for end-stage renal disease on hemodialysis on Monday, Wednesday, Friday schedule, paroxysmal atrial fibrillation, hypertension, hyperlipidemia, anemia of chronic kidney disease associated baseline hemoglobin range 5-39, chronic diastolic heart failure, chronic hyponatremia with baseline serum sodium range 1 30-1 34, who is admitted to [facility] on 09/27/2021 with acute on chronic hyponatremia after presenting from home to [facility] ED complaining of generalized weakness.   In the context of a history of previous hospitalizations for generalized weakness and frequent falls, including most recent hospitalization in January 2023, the patient presented to [facility] emergency department on the morning of 09/27/2021 complaining of generalized weakness and recurrent falls, noting that he had experienced an additional fall earlier that morning in which he stumbled as reported consequence of his generalized, nonfocal weakness.  He states that as a result of his generalized weakness he feels intermittently unsteady on his feet, contributing to his history of recurrent falls.  Did not hit his head as a component of the initial fall on the morning of 09/27/2021.    In the emergency department, his initial serum sodium level was found to be 125.  He was subsequently discharged home from the emergency department, but states that he experienced another ground-level mechanical fall, tripping while ambulating at home.  Once again, he conveys that he did not hit his head as a component of the  second fall, but presented back to [facility] emergency department for further evaluation management of generalized weakness resulting in multiple ground-level mechanical falls over the course the last day, without any reported associated loss of consciousness.  In the setting of his documented history of recurrent falls, he is not on any blood thinners as an outpatient, including no aspirin.  Per chart review, he has a history of chronic hyponatremia, with baseline serum sodium range of 1 30-1 34, with similar values dating back to October 2020.  Within that timeframe, the lowest previous serum sodium level was noted to be 124 in April 2020.  Denies any associated acute focal weakness, acute focal numbness, paresthesias, dysarthria, facial droop, acute change in vision, vertigo, dysphagia, or headache.  Denies any recent nausea, vomiting, abdominal pain, diarrhea.  Denies any recent chest pain, palpitations, diaphoresis, shortness of breath, dizziness, presyncope, or syncope.   He has a documented history of end-stage renal disease on hemodialysis, on Monday, Wednesday, Friday schedule, and notes that he missed his most recent the scheduled hemodialysis session, on 09/27/2021, noting that he completed his most recent hemodialysis session on Friday, 09/24/2021.  He conveys in spite of his incisional disease, he does continue to produce some urine, and is on torsemide 60 mg p.o. daily.  He notes that this is a long-term medication, without any associated recent dose modifications.  He also has a history of chronic diastolic heart failure, with most recent echocardiogram performed in September 2022, which was notable for LVEF 60 to 65%, no focal wall motion abnormalities, mild concentric LVH, indeterminate diastolic parameters, normal right ventricular systolic function, severely dilated left ventricular hypertrophy, and trivial mitral vegetation.  This was relative to  preceding echocardiogram in May 2022, which  was notable for grade 1 diastolic dysfunction.  Over the last day, after missing his most recently scheduled hemodialysis session, he reports some mild worsening of edema in his bilateral lower extremities, but denies overt shortness of breath, orthopnea, PND.  He acknowledges a former history of alcohol abuse, particularly at the time of his wife's passing in June 2022.  However, he denies any recent alcohol consumption.  No known history of chronic underlying pulmonary pathology, including no known history of COPD.  Denies any known history of underlying hypothyroidism or adrenal insufficiency.  No recent surgery.  Also denies any recent subjective fever, chills, rigors, or generalized myalgias or any recent neck stiffness.  Denies any recurrent use of NSAIDs, and confirms that his outpatient medications include Lexapro.  Denies use of any thiazide diuretics.  Denies any recent increase in the volume of consumption of free water.  No known history of seizures.      ED Course:  Vital signs in the ED were notable for the following: Afebrile; heart rate 70-71; blood pressure 146/60 - 158/62; respiratory rate 19-24, oxygen saturation 100% on room air.  Labs were notable for the following: Upon presenting back to [facility] emergency department this evening, updated BMP notable for the following: Sodium 123, potassium 3.6 chloride 88, bicarbonate 22, anion gap 13, glucose 88.  Serum ethanol level less than 10.  CBC notable for the following: With cell count 4000, hemoglobin 10.6.  Urinalysis showed no evidence of white blood cells nor any evidence of bacteria, and was notable for nitrate negative/leukocyte esterase negative findings.  Imaging and additional notable ED work-up: Noncontrast CT head showed no evidence of acute intracranial process, including no evidence of intracranial hemorrhage or any evidence of acute infarct.  Chest x-ray showed cardiomegaly with subsegmental atelectasis, without  evidence of infiltrate, edema, pleural effusion, or pneumothorax.  While in the ED, the following were administered: Normal saline x250 cc bolus.  Subsequently, the patient was admitted for further evaluation and management of acute on chronic hyponatremia after presenting with generalized weakness and frequent falls.     Review of Systems: As per HPI otherwise 10 point review of systems negative.   Past Medical History:  Diagnosis Date   Alcohol abuse 19/37/9024   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[place]) 03/17/2020   Alcoholism ([place])    in remission following wife's death   Anemia    Anxiety    Ascending aorta dilation (HCC)    Atrial fibrillation (HCC)    Cardiac arrest ([place]) 04/20/2019   12 min CPR with epi   Chronic diastolic CHF (congestive heart failure) ([place])    Dysrhythmia    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[place])    Mon Wed Fri   Fatty liver    Gastritis    GERD (gastroesophageal reflux disease)    H/O seasonal allergies    History of blood transfusion    History of GI bleed    Hx of adenomatous colonic polyps 08/10/2017   Hyperlipidemia    Hypertension    Insomnia    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   Mallory-Weiss tear    OA (osteoarthritis)    hands   OSA (obstructive sleep apnea)    No longer uses CPAP   Persistent atrial fibrillation with rapid ventricular response ([place]) 02/05/2020   Recurrent syncope    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([place])     Past Surgical History:  Procedure Laterality  Date   AMPUTATION Left 05/19/2021   Procedure: LEFT GREAT TOE AMPUTATION;  Surgeon: Newt Minion, MD;  Location: [place];  Service: Orthopedics;  Laterality: Left;   ANKLE SURGERY Left 2014   had rods put in    AV FISTULA PLACEMENT Left 04/21/2021   Procedure: LEFT ARM ARTERIOVENOUS (AV) FISTULA CREATION - Left brachiocephalic fistula;  Surgeon: Marty Heck, MD;  Location: [place];  Service: Vascular;  Laterality: Left;   BIOPSY   02/28/2019   Procedure: BIOPSY;  Surgeon: Milus Banister, MD;  Location: [place];  Service: Endoscopy;;   CARDIOVERSION  02/06/2020   CARDIOVERSION N/A 02/06/2020   Procedure: CARDIOVERSION;  Surgeon: Werner Lean, MD;  Location: [place] ENDOSCOPY;  Service: Cardiovascular;  Laterality: N/A;   CARDIOVERSION N/A 02/24/2020   Procedure: CARDIOVERSION;  Surgeon: Werner Lean, MD;  Location: [place];  Service: Cardiovascular;  Laterality: N/A;   COLONOSCOPY     [place]   COLONOSCOPY WITH PROPOFOL N/A 01/08/2021   Procedure: COLONOSCOPY WITH PROPOFOL;  Surgeon: Milus Banister, MD;  Location: [facility] ENDOSCOPY;  Service: Endoscopy;  Laterality: N/A;   ESOPHAGOGASTRODUODENOSCOPY (EGD) WITH PROPOFOL N/A 02/28/2019   Procedure: ESOPHAGOGASTRODUODENOSCOPY (EGD) WITH PROPOFOL;  Surgeon: Milus Banister, MD;  [facility] END[facility]COPY;  Service: Endoscopy;  Laterality: N/A;   FRACTURE SURGERY     HIP ARTHROPLASTY Left 04/01/2016   Procedure: ARTHROPLASTY BIPOLAR HIP (HEMIARTHROPLASTY);  Surgeon: Paralee Cancel, MD;  Location: [place];  Service: Orthopedics;  Laterality: Left;   HIP CLOSED REDUCTION Left 04/30/2016   Procedure: CLOSED REDUCTION HIP;  Surgeon: Wylene Simmer, MD;  Location: [place];  Service: Orthopedics;  Laterality: Left;   HOT HEMOSTASIS N/A 01/08/2021   Procedure: HOT HEMOSTASIS (ARGON PLASMA COAGULATION/BICAP);  Surgeon: Milus Banister, MD;  Location[facility] ENDOSCOPY;  Service: Endoscopy;  Laterality: N/A;   IR RADIOLOGIST EVAL & MGMT  12/31/2020   JOINT REPLACEMENT     TONSILLECTOMY     TOTAL HIP ARTHROPLASTY Right 10/15/2019   Procedure: TOTAL HIP ARTHROPLASTY ANTERIOR APPROACH;  Surgeon: Paralee Cancel, MD;  Location: [facility];  Service: Orthopedics;  Laterality: Right;  70 mins   VASCULAR SURGERY      Social History:  reports that he has quit smoking. His smoking use included cigarettes. He has a 20.00 pack-year smoking history. He has never used smokeless  tobacco. He reports that he does not currently use alcohol after a past usage of about 3.0 standard drinks per week. He reports that he does not use drugs.   Allergies  Allergen Reactions   Penicillins Rash    Family History  Problem Relation Age of Onset   Heart disease Mother 1   Hypertension Mother    Arthritis Mother    Heart failure Father 45   Stroke Maternal Aunt    Heart failure Sister    Colon cancer Neg Hx    Esophageal cancer Neg Hx    Stomach cancer Neg Hx    Rectal cancer Neg Hx     Family history reviewed and not pertinent    Prior to Admission medications   Medication Sig Start Date End Date Taking? Authorizing Provider  amiodarone (PACERONE) 200 MG tablet TAKE 1 TABLET BY MOUTH ONCE DAILY Patient taking differently: Take 200 mg by mouth every morning. 05/17/21  Yes Weaver, Scott T, PA-C  torsemide (DEMADEX) 20 MG tablet Take 60 mg by mouth See admin instructions. Take 3 tablets (60 mg) by mouth every morning, may also take  3 tablets (60 mg) in the afternoon as needed for swelling   Yes [provider]  acetaminophen (TYLENOL) 325 MG tablet Take 2 tablets (650 mg total) by mouth every 4 (four) hours as needed for headache or mild pain. Patient taking differently: Take 325-650 mg by mouth every 4 (four) hours as needed for headache. 03/10/20   Angiulli, Lavon Paganini, PA-C  albuterol (PROVENTIL) (2.5 MG/3ML) 0.083% nebulizer solution Take 3 mLs (2.5 mg total) by nebulization every 6 (six) hours as needed for wheezing or shortness of breath. 06/14/21   Bonnielee Haff, MD  atorvastatin (LIPITOR) 20 MG tablet Take 1 tablet (20 mg total) by mouth daily. Patient taking differently: Take 20 mg by mouth every morning. 03/10/20   Angiulli, Lavon Paganini, PA-C  carvedilol (COREG) 6.25 MG tablet Take 1 tablet (6.25 mg total) by mouth 2 (two) times daily. 02/11/21   Sherren Mocha, MD  cetirizine (ZYRTEC) 10 MG tablet Take 10 mg by mouth daily. Patient not taking: Reported on  06/08/2021    [provider]  escitalopram (LEXAPRO) 10 MG tablet Take 1 tablet (10 mg total) by mouth daily. Patient taking differently: Take 10 mg by mouth every morning. 03/10/20   Angiulli, Lavon Paganini, PA-C  ferrous sulfate 324 MG TBEC Take 324 mg by mouth every morning.    [provider]  FOLIC ACID PO Take 1 tablet by mouth every morning.    [provider]  HYDROcodone-acetaminophen (NORCO/VICODIN) 5-325 MG tablet Take 1 tablet by mouth every 4 (four) hours as needed (pain). 06/14/21   Bonnielee Haff, MD  Magnesium Oxide 400 MG CAPS Take 1 capsule (400 mg total) by mouth daily. Patient not taking: Reported on 06/08/2021 10/17/20   Eugenie Filler, MD  Multiple Vitamins-Minerals (CENTRUM SILVER 50+MEN) TABS Take 1 tablet by mouth every other day.    [provider]  nitroGLYCERIN (NITROSTAT) 0.4 MG SL tablet Place 1 tablet (0.4 mg total) under the tongue every 5 (five) minutes x 3 doses as needed for chest pain. 07/28/20   Baldwin Jamaica, PA-C  pantoprazole (PROTONIX) 40 MG tablet Take 1 tablet (40 mg total) by mouth daily. Patient taking differently: Take 40 mg by mouth every morning. 03/10/20   Angiulli, Lavon Paganini, PA-C  sodium bicarbonate 650 MG tablet Take 1 tablet (650 mg total) by mouth 2 (two) times daily. Patient not taking: Reported on 06/08/2021 01/22/21   Antonieta Pert, MD  tamsulosin (FLOMAX) 0.4 MG CAPS capsule Take 0.4 mg by mouth at bedtime as needed (frequent urination). 06/01/21   [provider]  traZODone (DESYREL) 50 MG tablet Take 1 tablet (50 mg total) by mouth at bedtime. Patient taking differently: Take 50 mg by mouth at bedtime as needed for sleep. 03/10/20   Angiulli, Lavon Paganini, PA-C  vitamin B-12 (CYANOCOBALAMIN) 500 MCG tablet Take 1 tablet (500 mcg total) by mouth daily. 06/11/21   Bonnielee Haff, MD     Objective    Physical Exam: Vitals:   09/27/21 2300 09/27/21 2330 09/27/21 2345 09/28/21 0041  BP: (!) 146/60 (!)  150/66 (!) 158/62 (!) 155/68  Pulse: 70 71 71 71  Resp: 20 19 (!) 27 (!) 24  Temp: 98.3 F (36.8 C)     TempSrc: Oral     SpO2: 100% 100% 100% 100%  Weight:    87.6 kg  Height:    6' (1.829 m)    General: appears to be stated age; alert, oriented Skin: warm, dry, no rash Head:  AT/[place] Mouth:  Oral mucosa membranes appear moist, normal dentition Neck: supple; trachea midline Heart:  RRR; did not appreciate any M/R/G Lungs: CTAB, did not appreciate any wheezes, rales, or rhonchi Abdomen: + BS; soft, ND, NT Vascular: 2+ pedal pulses b/l; 2+ radial pulses b/l Extremities: Trace edema in bilateral lower extremities, no muscle wasting Neuro: strength and sensation intact in upper and lower extremities b/l    Labs on Admission: I have personally reviewed following labs and imaging studies  CBC: Recent Labs  Lab 09/27/21 1049 09/28/21 0259  WBC 4.0 3.8*  NEUTROABS  --  2.6  HGB 10.6* 9.8*  HCT 28.8* 26.5*  MCV 91.1 89.8  PLT 132* 829*   Basic Metabolic Panel: Recent Labs  Lab 09/27/21 1049 09/27/21 2210  NA 125* 123*  K 3.9 3.6  CL 89* 88*  CO2 21* 22  GLUCOSE 77 88  BUN 22 25*  CREATININE 3.04* 3.16*  CALCIUM 8.8* 8.3*  MG  --  1.8   GFR: Estimated Creatinine Clearance: 20.5 mL/min (A) (by C-G formula based on SCr of 3.16 mg/dL (H)). Liver Function Tests: No results for input(s): AST, ALT, ALKPHOS, BILITOT, PROT, ALBUMIN in the last 168 hours. No results for input(s): LIPASE, AMYLASE in the last 168 hours. No results for input(s): AMMONIA in the last 168 hours. Coagulation Profile: No results for input(s): INR, PROTIME in the last 168 hours. Cardiac Enzymes: No results for input(s): CKTOTAL, CKMB, CKMBINDEX, TROPONINI in the last 168 hours. BNP (last 3 results) No results for input(s): PROBNP in the last 8760 hours. HbA1C: No results for input(s): HGBA1C in the last 72 hours. CBG: No results for input(s): GLUCAP in the last 168 hours. Lipid Profile: No  results for input(s): CHOL, HDL, LDLCALC, TRIG, CHOLHDL, LDLDIRECT in the last 72 hours. Thyroid Function Tests: No results for input(s): TSH, T4TOTAL, FREET4, T3FREE, THYROIDAB in the last 72 hours. Anemia Panel: No results for input(s): VITAMINB12, FOLATE, FERRITIN, TIBC, IRON, RETICCTPCT in the last 72 hours. Urine analysis:    Component Value Date/Time   COLORURINE YELLOW 09/27/2021 [place] 09/27/2021 2245   LABSPEC 1.005 09/27/2021 2245   PHURINE 8.0 09/27/2021 2245   GLUCOSEU NEGATIVE 09/27/2021 2245   HGBUR MODERATE (A) 09/27/2021 2245   BILIRUBINUR NEGATIVE 09/27/2021 2245   KETONESUR NEGATIVE 09/27/2021 2245   PROTEINUR NEGATIVE 09/27/2021 2245   NITRITE NEGATIVE 09/27/2021 2245   LEUKOCYTESUR NEGATIVE 09/27/2021 2245    Radiological Exams on Admission: CT Head Wo Contrast  Result Date: 09/27/2021 CLINICAL DATA:  Head trauma, minor (Age >= 65y) EXAM: CT HEAD WITHOUT CONTRAST TECHNIQUE: Contiguous axial images were obtained from the base of the skull through the vertex without intravenous contrast. RADIATION DOSE REDUCTION: This exam was performed according to the departmental dose-optimization program which includes automated exposure control, adjustment of the mA and/or kV according to patient size and/or use of iterative reconstruction technique. COMPARISON:  None Available. FINDINGS: Brain: No evidence of acute infarction, hemorrhage, hydrocephalus, extra-axial collection or mass lesion/mass effect. Cerebral atrophy. Vascular: Calcific intracranial atherosclerosis. No hyperdense vessel identified. Skull: No acute fracture. Sinuses/Orbits: Visualized sinuses are clear. No acute orbital findings. Other: Trace left mastoid effusion. IMPRESSION: 1. No evidence of acute intracranial abnormality. 2. 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 Electronically Signed   By: Margaretha Sheffield M.D.   On: 09/27/2021 11:56   DG Chest Port 1 View  Result Date: 09/27/2021 CLINICAL DATA:   Shortness of breath EXAM: PORTABLE CHEST 1 VIEW COMPARISON:  Previous studies  including the examination of 06/07/2021 FINDINGS: Transverse diameter of heart is increased. There are linear densities in the lower lung fields. There are no signs of pulmonary edema or focal pulmonary consolidation. There is no pleural effusion or pneumothorax. IMPRESSION: Cardiomegaly. Linear densities in the lower lung fields suggest scarring or subsegmental atelectasis. There are no signs of pulmonary edema or focal pulmonary consolidation. Electronically Signed   By: Elmer Picker M.D.   On: 09/27/2021 14:06   DG Hand Complete Left  Result Date: 09/27/2021 CLINICAL DATA:  Trauma, fall EXAM: LEFT HAND - COMPLETE 3+ VIEW COMPARISON:  None Available. FINDINGS: No recent fracture or dislocation is seen. Marked degenerative changes are noted in first carpometacarpal joint. Degenerative changes are noted in multiple interphalangeal joints. Soft tissue swelling is noted adjacent to the interphalangeal joints, more so in the PIP joint of the middle finger. There are no opaque foreign bodies. IMPRESSION: No recent fracture or dislocation is seen. Degenerative changes are noted in multiple joints as described in the body of the report. Electronically Signed   By: Elmer Picker M.D.   On: 09/27/2021 14:09     EKG: Independently reviewed, with result as described above.    Assessment/Plan   Principal Problem:   Acute hyponatremia Active Problems:   Hypertension   Hyperlipidemia   Depressive disorder   AF (paroxysmal atrial fibrillation) (HCC)   Anemia, chronic disease   Chronic heart failure with preserved ejection fraction (HFpEF) ([place])   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[place])   Falls frequently   Generalized weakness       #) Acute on chronic hypo-osmolar  hyponatremia: In the setting of a documented history of chronic hyponatremia with baseline serum sodium range of 1 30-1 34 dating back nearly 3  years, with previous low serum sodium level noted to be 124 in Oct 2020, presenting serum sodium level is found to be slightly lower than his baseline range, with presenting value noted to be 123.  Potentially multifactorial in nature in terms of the factors leading to the mild acute exacerbation of his chronic hyponatremia, including suspected contribution from acute volume overload as a consequence of missing his most recent scheduled hemodialysis session.  In the setting of his recurrent ground-level mechanical falls that he reports are as a consequence of his generalized weakness, differential includes SIADH, although the patient conveys that he has not struck his head as a component of these falls.  Chest x-ray shows evidence of subsegmental atelectasis, but otherwise no evidence of acute cardiopulmonary process.  Will further evaluate for contribution from SIADH, as detailed below.  Will refrain from additional IV fluid administration, as presentation appears less suggestive of a strong element of dehydration.  He confirms a history of chronic alcohol abuse, but reports that this was more so in the setting of his wife's passing, noting that he no longer consumes alcohol.  Presenting serum ethanol level less than 10.  Given concomitant generalized weakness, will also check TSH.  Not associate with any acute focal neurologic deficits, and no overt evidence of seizures.  Plan includes pursuit of hemodialysis in the morning to eliminate the confounding variable posed by acute volume overload as a consequence of the patient missing his most recently scheduled hemodialysis session.  Following hemodialysis, can reevaluate serum sodium level to evaluate for residual degree of hyponatremia to assist with guidance of additional evaluation.   Plan: monitor strict I's and O's and daily weights.  check random urine sodium, urine osmolality.  Check  serum osmolality to confirm suspected hypoosmolar etiology.  I have  ordered every 4 hour BMPs through 9 AM on 09/28/2021.  Check TSH. Check serum uric acid level, as SIADH can be associated with hypouricemia due to hyperuricuria.  Hold home Lexapro for now.  Anticipate consultation of nephrology in the morning for assistance with nonurgent hemodialysis.  Add on BNP.  Check INR.         #) Generalized weakness: In the absence of any acute focal neurologic deficits.  Suspect some contribution from presenting acute on chronic hyponatremia, as further detailed above.  No evidence of underlying infectious process at this time, including urinalysis which was inconsistent with UTI, while chest x-ray shows no evidence of pneumonia, including no infiltrate.  Plan: Fall precautions.  Check TSH, and his calcium level, MMA.  Further evaluation management of acute on chronic hyponatremia, as above.  Monitor strict I's and O's as well as daily weights.  PT/OT consults been placed for the morning.       #) Frequent falls: Patient has a documented history of recurrent falls, resulting in multiple previous hospitalizations, including most recently in January 2023.  That there is contribution from recurrent generalized weakness contributing to feeling unsteady on his feet predisposing him to these ground-level mechanical falls.  As noted above, presenting CT head showed no evidence of acute intracranial process, including no evidence of intracranial hemorrhage.  Not on any blood thinners as an outpatient as a consequence of his history of frequent falls.  His history of alcohol abuse is noted, raising the possibility of posterior column pathology.  For diagnostic purposes we will assess thiamine level, although effectiveness and if intervention if low would be limited at this time.  Plan: Further evaluation management of generalized weakness, as above, including PT/OT consults.  Fall precautions.  Check TSH, and his calcium, MMA, thiamine level.  Repeat CMP and CBC in the  morning.           #) ESRD: on HD (schedule: Monday, Wednesday, Friday).  He missed his most recent scheduled hemodialysis session on Monday, 09/27/2021.  Presentation suggestive of mild volume overload, but without any associated respiratory distress.  Additionally, presenting labs demonstrate no evidence of hypokalemia nor any evidence of anion gap metabolic acidosis, while the patient also does not demonstrate evidence consistent with uremia.  Consequently, there did not appear to be any indications for urgent overnight hemodialysis.   Plan: monitor strict I's/O's, daily weights. CMP in the AM. Check mag and phos levels.  Anticipate consultation of nephrology in the morning for assistance with arrangement for nonemergent hemodialysis.       #) Anemia of chronic kidney disease: Documented history of such, associated with baseline hemoglobin range of 8-11, with presenting hemoglobin consistent with this range, in the absence of any evidence of active bleed.  He is on daily oral iron supplementation as an outpatient.  Plan: Repeat CBC in the morning.  Continue home daily oral iron supplementation.             #) Paroxysmal atrial fibrillation: Documented history of such. In setting of CHA2DS2-VASc score of 6, there is an indication for chronic anticoagulation for thromboembolic prophylaxis.  However, in the context of the patient's documented history of recurrent falls, he is not on any blood thinning agents as an outpatient, including no formal anticoagulation.  Home AV nodal blocking regimen: None.  Rather, rhythm control strategy pursued via outpatient amiodarone.  Most recent echocardiogram occurred in September 2022, as  further detailed above.. Presenting EKG flecked sinus rhythm without overt evidence of acute ischemic changes..    Plan: monitor strict I's & O's and daily weights. Repeat BMP/CBC in AM. Check serum mag level. Continue home amiodarone.         #)  Essential Hypertension: documented h/o such, with outpatient antihypertensive regimen including Coreg.  SBP's in the ED today: 140s to 160s mmHg.   Plan: Close monitoring of subsequent BP via routine VS. continue home Coreg         #) Hyperlipidemia: documented h/o such. On atorvastatin as outpatient.    Plan: continue home statin.           #) Chronic diastolic heart failure: documented history of such, with most recent echocardiogram performed in September 2022 notable for LVEF 60 to 65% as well as indeterminate diastolic parameters, relative to preceding echocardiogram in May 2022 notable for grade 1 diastolic dysfunction. No over clinical evidence to suggest acutely decompensated heart failure at this time.  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 daily as outpatient.  In setting of anticipated hemodialysis in the morning, will hold home torsemide for now, while pursuing further evaluation management of presenting acute on chronic hyponatremia.   Plan: monitor strict I's & O's and daily weights. Repeat BMP in AM. Check serum mag level.  Add on BNP.        #) Depression: Documented history of such, on Lexapro as outpatient.  While it appears less likely that Lexapro is posing any significant contributions to the acute aspect of his presenting acute on chronic hyponatremia, suspect some contribution towards the more chronic element of this process.  Will hold home Lexapro for now pending improvement in serum sodium range in the direction of baseline.  Plan: Hold home Lexapro for now.  Further evaluation management of acute on chronic hyponatremia, as further detailed above.      DVT prophylaxis: SCD's   Code Status: DNR/DNI per patient's conveyance to me and consistent with paperwork on file. Family Communication: none Disposition Plan: Per Rounding  Team Consults called: none;  Admission status: Inpatient   PLEASE NOTE THAT DRAGON DICTATION SOFTWARE WAS USED IN THE CONSTRUCTION OF THIS NOTE.   [place] DO [facility]  From [place]   09/28/2021, 3:55 AM

## 2021-09-28 NOTE — Progress Notes (Signed)
Adm from ED to rm 31 in stable condition per stretcher. Pt is alert and oriented x4.

## 2021-09-28 NOTE — Assessment & Plan Note (Deleted)
 #)   Generalized weakness: In the absence of any acute focal neurologic deficits.  Suspect some contribution from presenting acute on chronic hyponatremia, as further detailed above.  No evidence of underlying infectious process at this time, including urinalysis which was inconsistent with UTI, while chest x-ray shows no evidence of pneumonia, including no infiltrate.  Plan: Fall precautions.  Check TSH, and his calcium level, MMA.  Further evaluation management of acute on chronic hyponatremia, as above.  Monitor strict I's and O's as well as daily weights.  PT/OT consults been placed for the morning.

## 2021-09-28 NOTE — Assessment & Plan Note (Signed)
Correction with HD.

## 2021-09-28 NOTE — Assessment & Plan Note (Deleted)
 #)   Depression: Documented history of such, on Lexapro as outpatient.  While it appears less likely that Lexapro is posing any significant contributions to the acute aspect of his presenting acute on chronic hyponatremia, suspect some contribution towards the more chronic element of this process.  Will hold home Lexapro for now pending improvement in serum sodium range in the direction of baseline.  Plan: Hold home Lexapro for now.  Further evaluation management of acute on chronic hyponatremia, as further detailed above.

## 2021-09-28 NOTE — Evaluation (Signed)
Occupational Therapy Evaluation Patient Details Name: Darryl Diaz MRN: [id number] DOB: December 27, 1940 Today's Date: 09/28/2021   History of Present Illness Pt is a 81 yr old who presented 09/27/21 due to generalized weakness and falls due to acute on chronic hyponatremia .  PMH: ESRD with HD MWF, afib, HTN, HLD, anemia, frequent falls, CHF, GI bleed, cardia arrest   Clinical Impression   Pt at PLOF lives alone but has hired an Engineer, production to assist as needed and has family who lives locally but unable to assist 24/7. Pt reported having multiple falls at home. Pt at this time required min guard for sit to stand transfer with decrease in control of lowering self to a sitting position. Pt requires set up for UE ADLs and moderate for BLE ADLs. Pt required mod to max with toilet hygiene in standing. Pt currently with functional limitations due to the deficits listed below (see OT Problem List).  Pt will benefit from skilled OT to increase their safety and independence with ADL and functional mobility for ADL to facilitate discharge to venue listed below.       Recommendations for follow up therapy are one component of a multi-disciplinary discharge planning process, led by the attending physician.  Recommendations may be updated based on patient status, additional functional criteria and insurance authorization.   Follow Up Recommendations  Skilled nursing-short term rehab (<3 hours/day)    Assistance Recommended at Discharge Frequent or constant Supervision/Assistance  Patient can return home with the following A little help with walking and/or transfers;Assistance with cooking/housework;Assist for transportation;A lot of help with bathing/dressing/bathroom    Functional Status Assessment  Patient has had a recent decline in their functional status and demonstrates the ability to make significant improvements in function in a reasonable and predictable amount of time.  Equipment Recommendations   (TBD  at next level of care)    Recommendations for Other Services       Precautions / Restrictions Precautions Precautions: Fall Precaution Comments: multiple falls and presents with bruises from falls Restrictions Weight Bearing Restrictions: No      Mobility Bed Mobility Overal bed mobility:  (Presented on BSC)                  Transfers Overall transfer level: Needs assistance Equipment used: Rolling walker (2 wheels) Transfers: Sit to/from Stand Sit to Stand: Min guard           General transfer comment: noted when going into sitting decrease in control and reports they always fall backwards      Balance Overall balance assessment: Needs assistance Sitting-balance support: Feet supported Sitting balance-Leahy Scale: Fair     Standing balance support: Bilateral upper extremity supported Standing balance-Leahy Scale: Poor                             ADL either performed or assessed with clinical judgement   ADL Overall ADL's : Needs assistance/impaired Eating/Feeding: Independent;Sitting   Grooming: Wash/dry hands;Wash/dry face;Set up;Sitting   Upper Body Bathing: Set up;Sitting   Lower Body Bathing: Moderate assistance;Cueing for safety;Cueing for sequencing;Sit to/from stand   Upper Body Dressing : Set up;Sitting   Lower Body Dressing: Cueing for safety;Cueing for sequencing;Sit to/from stand;Moderate assistance   Toilet Transfer: Min guard;Cueing for safety;Cueing for sequencing;BSC/3in1   Toileting- Water quality scientist and Hygiene: Moderate assistance;Cueing for safety;Cueing for sequencing;Sit to/from stand       Functional mobility during ADLs: Min guard;Rolling walker (  2 wheels)       Vision Baseline Vision/History: 1 Wears glasses Ability to See in Adequate Light: 0 Adequate Patient Visual Report: No change from baseline Vision Assessment?: No apparent visual deficits     Perception     Praxis      Pertinent  Vitals/Pain Pain Assessment Pain Assessment: No/denies pain     Hand Dominance Right   Extremity/Trunk Assessment Upper Extremity Assessment Upper Extremity Assessment: Generalized weakness   Lower Extremity Assessment Lower Extremity Assessment: Defer to PT evaluation   Cervical / Trunk Assessment Cervical / Trunk Assessment: Kyphotic   Communication Communication Communication: No difficulties   Cognition Arousal/Alertness: Awake/alert Behavior During Therapy: WFL for tasks assessed/performed Overall Cognitive Status: Within Functional Limits for tasks assessed                                       General Comments       Exercises     Shoulder Instructions      Home Living Family/patient expects to be discharged to:: Private residence Living Arrangements: Alone Available Help at Discharge: Personal care attendant Type of Home: House ([place] home) Home Access: Level entry     Home Layout: One level     Bathroom Shower/Tub: Walk-in Psychologist, prison and probation services: Standard Bathroom Accessibility: Yes   Home Equipment: Conservation officer, nature (2 wheels);Rollator (4 wheels)   Additional Comments: Pt reports they have an aide come in about 3 hours to assist for gathering household items and assist as needed but not they call as needed. Pt reports they were still using as they were still unpacking house.      Prior Functioning/Environment Prior Level of Function : Independent/Modified Independent;History of Falls (last six months)                        OT Problem List: Decreased strength;Decreased activity tolerance;Impaired balance (sitting and/or standing);Decreased safety awareness;Pain      OT Treatment/Interventions: Self-care/ADL training;Therapeutic exercise;DME and/or AE instruction;Therapeutic activities;Patient/family education;Balance training    OT Goals(Current goals can be found in the care plan section) Acute Rehab OT Goals Patient  Stated Goal: to get stronger OT Goal Formulation: With patient Time For Goal Achievement: 10/12/21 Potential to Achieve Goals: Good ADL Goals Pt Will Perform Upper Body Bathing: with modified independence;sitting Pt Will Perform Lower Body Bathing: with supervision;with adaptive equipment;sit to/from stand Pt Will Transfer to Toilet: with supervision;ambulating Pt Will Perform Tub/Shower Transfer: with supervision;ambulating;shower seat  OT Frequency: Min 2X/week    Co-evaluation              AM-PAC OT "6 Clicks" Diaz Activity     Outcome Measure Help from another person eating meals?: None Help from another person taking care of personal grooming?: A Little Help from another person toileting, which includes using toliet, bedpan, or urinal?: A Little Help from another person bathing (including washing, rinsing, drying)?: A Lot Help from another person to put on and taking off regular upper body clothing?: A Little Help from another person to put on and taking off regular lower body clothing?: A Lot 6 Click Score: 17   End of Session Equipment Utilized During Treatment: Rolling walker (2 wheels);Gait belt Nurse Communication: Mobility status  Activity Tolerance: Patient tolerated treatment well Patient left: in chair;with call bell/phone within reach  OT Visit Diagnosis: Unsteadiness on feet (R26.81);Other abnormalities of gait  and mobility (R26.89);Repeated falls (R29.6);Muscle weakness (generalized) (M62.81)                Time: 5361-4431 OT Time Calculation (min): 38 min Charges:  OT General Charges $OT Visit: 1 Visit OT Evaluation $OT Eval Low Complexity: 1 Low OT Treatments $Self Care/Home Management : 23-37 mins  Joeseph Amor OTR/L  Acute Rehab Services  [phone] office number [phone] pager number   Joeseph Amor 09/28/2021, 9:10 AM

## 2021-09-28 NOTE — TOC CAGE-AID Note (Signed)
Transition of Care [facility]) - CAGE-AID Screening   Patient Details  Name: Darryl Diaz MRN: [id number] Date of Birth: 04/06/1941  Transition of Care [facility]) CM/SW Contact:    Gaetano Hawthorne Tarpley-Carter, [place] Phone Number: 09/28/2021, 10:25 AM   Clinical Narrative: Pt participated in [place].  Pt stated she does not use substance or ETOH.  Pt was not offered resources, due to no usage of substance or ETOH.     Kalyan Barabas Tarpley-Carter, MSW, LCSW-A Pronouns:  She/Her/Hers [facility]Transitions of Care Clinical Social Worker Direct Number:  [phone] [email or web address]   CAGE-AID Screening:    Have You Ever Felt You Ought to Cut Down on Your Drinking or Drug Use?: No Have People Annoyed You By [employer] Your Drinking Or Drug Use?: No Have You Felt Bad Or Guilty About Your Drinking Or Drug Use?: No Have You Ever Had a Drink or Used Drugs First Thing In The Morning to Steady Your Nerves or to Get Rid of a Hangover?: No CAGE-AID Score: 0  Substance Abuse Education Offered: No

## 2021-09-28 NOTE — Assessment & Plan Note (Deleted)
 #)   Chronic diastolic heart failure: documented history of such, with most recent echocardiogram performed in September 2022 notable for LVEF 60 to 65% as well as indeterminate diastolic parameters, relative to preceding echocardiogram in May 2022 notable for grade 1 diastolic dysfunction. No over clinical evidence to suggest acutely decompensated heart failure at this time.  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 daily as outpatient.  In setting of anticipated hemodialysis in the morning, will hold home torsemide for now, while pursuing further evaluation management of presenting acute on chronic hyponatremia.   Plan: monitor strict I's & O's and daily weights. Repeat BMP in AM. Check serum mag level.  Add on BNP.

## 2021-09-28 NOTE — Consult Note (Signed)
Renal Service Consult Note [facility]  Darryl Diaz 09/28/2021 Sol Blazing, MD Requesting Physician: Dr. Marlowe Sax  Reason for Consult: ESRD pt w/ gen'd weakness, falls HPI: The patient is a 81 y.o. year-old w/ hx of etoh abuse, anemia, anxiety, atrial fib, ESRD on HD, GIB, HL,  HTN, depression who presented to ED last night w/ c/o generalized weakness and multiple falls recently. Having "balance problems" and difficulty walking. Lives alone. Last dialysis was Friday (pt missed Monday 5/22). In ED Na 125, creat 3.0, Ca 8.8, Hb 10.6. BP's slightly high in ED, HR and RR wnl. Afeb. Pt admitted. We are asked to see for ESRD.    Pt seen in his room. Pt lives alone, has a sister who lives in [place]. Pt lives in [place].  Drinks about 2-3 bottles of wine per week. No tobacco. Has [place] public transport to get him to and from HD. Lately main issues have been balance, falling, ambulating problems. Uses a walker now (was using WC).    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 Plan was for 6 wks of abx. DC'd to SNF level at his facility. Also had COVID incidental finding.   ROS - denies CP, no joint pain, no HA, no blurry vision, no rash, no diarrhea, no nausea/ vomiting, no dysuria, no difficulty voiding   Past Medical History  Past Medical History:  Diagnosis Date   Alcohol abuse 09/38/1829   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[place]) 03/17/2020   Alcoholism ([place])    in remission following wife's death   Anemia    Anxiety    Ascending aorta dilation (HCC)    Atrial fibrillation (HCC)    Cardiac arrest ([place]) 04/20/2019   12 min CPR with epi   Chronic diastolic CHF (congestive heart failure) ([place])    Dysrhythmia    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[place])    Mon Wed Fri   Fatty liver    Gastritis    GERD (gastroesophageal reflux disease)    H/O seasonal allergies    History of blood transfusion    History of  GI bleed    Hx of adenomatous colonic polyps 08/10/2017   Hyperlipidemia    Hypertension    Insomnia    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   Mallory-Weiss tear    OA (osteoarthritis)    hands   OSA (obstructive sleep apnea)    No longer uses CPAP   Persistent atrial fibrillation with rapid ventricular response ([place]) 02/05/2020   Recurrent syncope    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[facility])    Past Surgical History  Past Surgical History:  Procedure Laterality Date   AMPUTATION Left 05/19/2021   Procedure: LEFT GREAT TOE AMPUTATION;  Surgeon: Newt Minion, MD;  Location: [place];  Service: Orthopedics;  Laterality: Left;   ANKLE SURGERY Left 2014   had rods put in    AV FISTULA PLACEMENT Left 04/21/2021   Procedure: LEFT ARM ARTERIOVENOUS (AV) FISTULA CREATION - Left brachiocephalic fistula;  Surgeon: Marty Heck, MD;  Location: [place];  Service: Vascular;  Laterality: Left;   BIOPSY  02/28/2019   Procedure: BIOPSY;  Surgeon: Milus Banister, MD;  Location: [place];  Service: Endoscopy;;   CARDIOVERSION  02/06/2020   CARDIOVERSION N/A 02/06/2020   Procedure: CARDIOVERSION;  Surgeon: Werner Lean, MD;  Location: [place];  Service: Cardiovascular;  Laterality: N/A;  CARDIOVERSION N/A 02/24/2020   Procedure: CARDIOVERSION;  Surgeon: Werner Lean, MD;  Location: [place];  Service: Cardiovascular;  Laterality: N/A;   COLONOSCOPY     [place]   COLONOSCOPY WITH PROPOFOL N/A 01/08/2021   Procedure: COLONOSCOPY WITH PROPOFOL;  Surgeon: Milus Banister, MD;  Location: [facility] ENDOSCOPY;  Service: Endoscopy;  Laterality: N/A;   ESOPHAGOGASTRODUODENOSCOPY (EGD) WITH PROPOFOL N/A 02/28/2019   Procedure: ESOPHAGOGASTRODUODENOSCOPY (EGD) WITH PROPOFOL;  Surgeon: Milus Banister, MD;  Location: [facility] ENDOSCOPY;  Service: Endoscopy;  Laterality: N/A;   FRACTURE SURGERY     HIP ARTHROPLASTY Left 04/01/2016   Procedure: ARTHROPLASTY BIPOLAR HIP  (HEMIARTHROPLASTY);  Surgeon: Paralee Cancel, MD;  Location: [place];  Service: Orthopedics;  Laterality: Left;   HIP CLOSED REDUCTION Left 04/30/2016   Procedure: CLOSED REDUCTION HIP;  Surgeon: Wylene Simmer, MD;  Location: [place];  Service: Orthopedics;  Laterality: Left;   HOT HEMOSTASIS N/A 01/08/2021   Procedure: HOT HEMOSTASIS (ARGON PLASMA COAGULATION/BICAP);  Surgeon: Milus Banister, MD;  Location: [facility] ENDOSCOPY;  Service: Endoscopy;  Laterality: N/A;   IR RADIOLOGIST EVAL & MGMT  12/31/2020   JOINT REPLACEMENT     TONSILLECTOMY     TOTAL HIP ARTHROPLASTY Right 10/15/2019   Procedure: TOTAL HIP ARTHROPLASTY ANTERIOR APPROACH;  Surgeon: Paralee Cancel, MD;  Location: [facility];  Service: Orthopedics;  Laterality: Right;  70 mins   VASCULAR SURGERY     Family History  Family History  Problem Relation Age of Onset   Heart disease Mother 14   Hypertension Mother    Arthritis Mother    Heart failure Father 62   Stroke Maternal Aunt    Heart failure Sister    Colon cancer Neg Hx    Esophageal cancer Neg Hx    Stomach cancer Neg Hx    Rectal cancer Neg Hx    Social History  reports that he has quit smoking. His smoking use included cigarettes. He has a 20.00 pack-year smoking history. He has never used smokeless tobacco. He reports that he does not currently use alcohol after a past usage of about 3.0 standard drinks per week. He reports that he does not use drugs. Allergies  Allergies  Allergen Reactions   Penicillins Rash   Home medications Prior to Admission medications   Medication Sig Start Date End Date Taking? Authorizing Provider  amiodarone (PACERONE) 200 MG tablet TAKE 1 TABLET BY MOUTH ONCE DAILY Patient taking differently: Take 200 mg by mouth every morning. 05/17/21  Yes Weaver, Scott T, PA-C  torsemide (DEMADEX) 20 MG tablet Take 60 mg by mouth See admin instructions. Take 3 tablets (60 mg) by mouth every morning, may also take 3 tablets (60 mg) in the afternoon as needed for  swelling   Yes [provider]  acetaminophen (TYLENOL) 325 MG tablet Take 2 tablets (650 mg total) by mouth every 4 (four) hours as needed for headache or mild pain. Patient taking differently: Take 325-650 mg by mouth every 4 (four) hours as needed for headache. 03/10/20   Angiulli, Lavon Paganini, PA-C  albuterol (PROVENTIL) (2.5 MG/3ML) 0.083% nebulizer solution Take 3 mLs (2.5 mg total) by nebulization every 6 (six) hours as needed for wheezing or shortness of breath. 06/14/21   Bonnielee Haff, MD  atorvastatin (LIPITOR) 20 MG tablet Take 1 tablet (20 mg total) by mouth daily. Patient taking differently: Take 20 mg by mouth every morning. 03/10/20   Angiulli, Lavon Paganini, PA-C  carvedilol (COREG) 6.25 MG tablet Take 1  tablet (6.25 mg total) by mouth 2 (two) times daily. 02/11/21   Sherren Mocha, MD  cetirizine (ZYRTEC) 10 MG tablet Take 10 mg by mouth daily. Patient not taking: Reported on 06/08/2021    [provider]  escitalopram (LEXAPRO) 10 MG tablet Take 1 tablet (10 mg total) by mouth daily. Patient taking differently: Take 10 mg by mouth every morning. 03/10/20   Angiulli, Lavon Paganini, PA-C  ferrous sulfate 324 MG TBEC Take 324 mg by mouth every morning.    [provider]  FOLIC ACID PO Take 1 tablet by mouth every morning.    [provider]  HYDROcodone-acetaminophen (NORCO/VICODIN) 5-325 MG tablet Take 1 tablet by mouth every 4 (four) hours as needed (pain). 06/14/21   Bonnielee Haff, MD  Magnesium Oxide 400 MG CAPS Take 1 capsule (400 mg total) by mouth daily. Patient not taking: Reported on 06/08/2021 10/17/20   Eugenie Filler, MD  Multiple Vitamins-Minerals (CENTRUM SILVER 50+MEN) TABS Take 1 tablet by mouth every other day.    [provider]  nitroGLYCERIN (NITROSTAT) 0.4 MG SL tablet Place 1 tablet (0.4 mg total) under the tongue every 5 (five) minutes x 3 doses as needed for chest pain. 07/28/20   Baldwin Jamaica, PA-C  pantoprazole (PROTONIX)  40 MG tablet Take 1 tablet (40 mg total) by mouth daily. Patient taking differently: Take 40 mg by mouth every morning. 03/10/20   Angiulli, Lavon Paganini, PA-C  sodium bicarbonate 650 MG tablet Take 1 tablet (650 mg total) by mouth 2 (two) times daily. Patient not taking: Reported on 06/08/2021 01/22/21   Antonieta Pert, MD  tamsulosin (FLOMAX) 0.4 MG CAPS capsule Take 0.4 mg by mouth at bedtime as needed (frequent urination). 06/01/21   [provider]  traZODone (DESYREL) 50 MG tablet Take 1 tablet (50 mg total) by mouth at bedtime. Patient taking differently: Take 50 mg by mouth at bedtime as needed for sleep. 03/10/20   Angiulli, Lavon Paganini, PA-C  vitamin B-12 (CYANOCOBALAMIN) 500 MCG tablet Take 1 tablet (500 mcg total) by mouth daily. 06/11/21   Bonnielee Haff, MD     Vitals:   09/28/21 0041 09/28/21 0400 09/28/21 0812 09/28/21 1137  BP: (!) 155/68 (!) 141/77 124/71 (!) 110/42  Pulse: 71 72 66 (!) 59  Resp: (!) '24 15 18 18  '$ Temp:   98.2 F (36.8 C) 97.9 F (36.6 C)  TempSrc:   Oral Oral  SpO2: 100% 95% 97% 99%  Weight: 87.6 kg     Height: 6' (1.829 m)      Exam Gen alert, no distress, elderly, a bit frail No rash, cyanosis or gangrene Sclera anicteric, throat clear  No jvd or bruits Chest clear bilat to bases, no rales/ wheezing RRR 2/6 sem , no RG Abd soft ntnd no mass or ascites +bs GU normal male MS no joint effusions or deformity Ext 1+ pretib edema, no wounds or ulcers Neuro is alert, Ox 3 , nf    LUA AVF +bruit       Home meds include - amiodarone, torsemide 40-60 mg 1-2 x per day, tylenol, albuterol, atorvastatin, carvedilol 6.25 bid, zyrtec, escitalopram, ferrous sulfate, folic acid, norco prn, mg oxide, MVI, sl ntg, protonix, sodium bicarb, flomax, trazodone, cyanocobalamin     Na 125 > 128 today    K 3.1  CO2 23  BUN 27  Cr 3.34   Ca 8.7  Alb 3.1 LFT"s slightly up   WBC 3K  Hb 10.8  OP HD: [facility] MWF  4h  350/ 1.5  84.5kg  2/2 bath LUA AVF   Hep none -  last HD 5/19, 81.5 > 81kg - mircera 150 ug q2, not started yet - venofer 100 mg tiw 5/19- 5/24, 1 of 3 given   Assessment/ Plan: Gen'd weakness/ falls - similar admit in Feb 2023 w/ falls and 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 Possibly deconditioning and /or etoh contributing. Per pmd. Is compliant w/ HD sessions, doubt uremia.  ESRD - missed HD yesterday (5/22). Low K+. Plan short HD today (pt request for missed HD yest) then again tomorrow to get back on schedule.  HTN / vol - BP's wnl, on coreg here as at home. 3kg up. UF 2-3 L today and tomorrow.  Anemia esrd - Hb >10. If drops will start darbe 150 weekly, as OP unit was planning to resume esa w/ mircera 150 q 2wks. Will cont IV Fe load w/ 2 more sessions.  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 Cont binders.  Atrial fib - on amio and BB Depression/ anxiety Etoh abuse      Rob Nyla Creason  MD 09/28/2021, 1:48 PM Recent Labs  Lab 09/28/21 0259 09/28/21 0912  HGB 9.8* 10.8*  ALBUMIN 3.1*  --   CALCIUM 8.6* 8.7*  PHOS 3.1  --   CREATININE 3.28* 3.34*  K 3.6 3.1*   Inpatient medications:  amiodarone  200 mg Oral q morning   atorvastatin  20 mg Oral q morning   carvedilol  6.25 mg Oral BID WC   Chlorhexidine Gluconate Cloth  6 each Topical Daily   ferrous sulfate  325 mg Oral Q breakfast   folic acid  1 mg Oral Daily   vitamin B-12  500 mcg Oral Daily    acetaminophen **OR** acetaminophen

## 2021-09-28 NOTE — Assessment & Plan Note (Signed)
Ground-level fall this time.  Laceration in the left ring and last finger. CT head unremarkable.  X-ray left hand unremarkable. PT recommends SNF.

## 2021-09-28 NOTE — Assessment & Plan Note (Deleted)
 #)   Essential Hypertension: documented h/o such, with outpatient antihypertensive regimen including Coreg.  SBP's in the ED today: 140s to 160s mmHg.   Plan: Close monitoring of subsequent BP via routine VS. continue home Coreg

## 2021-09-28 NOTE — Assessment & Plan Note (Deleted)
#)   Hyperlipidemia: documented h/o such. On atorvastatin as outpatient.  ?  ?  ?Plan: continue home statin.  ?  ?

## 2021-09-28 NOTE — Assessment & Plan Note (Signed)
Reportedly have diarrhea at home. 2 episode so far this morning with very dark appearing stool.  Pad with red-tinged markings around the stool.  FOBT negative.  Has history of AV malformation as well as possible upper GI bleed and therefore not on any anticoagulation. Recently treated with antibiotics for osteomyelitis as well. Monitor.

## 2021-09-28 NOTE — Progress Notes (Addendum)
Noted pt was re-admitted to hospital after d/c from ED yesterday. Arrangements had been made for pt to receive out-pt HD today at [facility].Contacted [facility] and spoke to Marceline Bend. Clinic aware that pt was admitted to [facility] and will not make treatment today. Pt receives out-pt treatment on MWF at [facility]. Will assist as needed.   Melven Sartorius Renal Navigator [phone]  Addendum at 2:32 pm: Pt's out-pt schedule at [facility] is MWF 11:50 arrival for 12:10 chair time.

## 2021-09-28 NOTE — Assessment & Plan Note (Signed)
Continue statin. 

## 2021-09-28 NOTE — Assessment & Plan Note (Signed)
On torsemide. Does not appear to have any acute issues.

## 2021-09-28 NOTE — Assessment & Plan Note (Signed)
Nephrology consult for HD. Appreciate assistance.

## 2021-09-28 NOTE — Assessment & Plan Note (Deleted)
 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  Potentially multifactorial in nature in terms of the factors leading to the mild acute exacerbation of his chronic hyponatremia, including suspected contribution from acute volume overload as a consequence of missing his most recent scheduled hemodialysis session.  In the setting of his recurrent ground-level mechanical falls that he reports are as a consequence of his generalized weakness, differential includes SIADH, although the patient conveys that he has not struck his head as a component of these falls.  Chest x-ray shows evidence of subsegmental atelectasis, but otherwise no evidence of acute cardiopulmonary process.  Will further evaluate for contribution from SIADH, as detailed below.  Will refrain from additional IV fluid administration, as presentation appears less suggestive of a strong element of dehydration.  He confirms a history of chronic alcohol abuse, but reports that this was more so in the setting of his wife's passing, noting that he no longer consumes alcohol.  Presenting serum ethanol level less than 10.  Given concomitant generalized weakness, will also check TSH.  Not associate with any acute focal neurologic deficits, and no overt evidence of seizures.  Plan includes pursuit of hemodialysis in the morning to eliminate the confounding variable posed by acute volume overload as a consequence of the patient missing his most recently scheduled hemodialysis session.  Following hemodialysis, can reevaluate serum sodium level to evaluate for residual degree of hyponatremia to assist with guidance of additional  evaluation.   Plan: monitor strict I's and O's and daily weights.  check random urine sodium, urine osmolality.  Check serum osmolality to confirm suspected hypoosmolar etiology.  I have ordered every 4 hour BMPs through 9 AM on 09/28/2021.  Check TSH. Check serum uric acid level, as SIADH can be associated with hypouricemia due to hyperuricuria.  Hold home Lexapro for now.  Anticipate consultation of nephrology in the morning for assistance with nonurgent hemodialysis.  Add on BNP.  Check INR.

## 2021-09-28 NOTE — Plan of Care (Signed)
Adm from home for 2 falls on day of admission. Na+ in the morning was 125. Sm laceration on left hand between ring finger and pinky finger sutured. Fall later in the day without injury. Pt missed HD r/t being in the ED.

## 2021-09-28 NOTE — Progress Notes (Signed)
  Progress Note Patient: Darryl Diaz DQQ:[id number] DOB: 1940-09-26 DOA: 09/27/2021  DOS: the patient was seen and examined on 09/28/2021  Brief hospital course: PMH of ESRD on HD MWF, PAF not on anticoagulation due to recurrent fall, HTN, HLD, anemia of CKD, chronic HFpEF, chronic hyponatremia, osteomyelitis.  Presented to the hospital with complaint of generalized weakness and missing HD as well as diarrhea. Nephrology consult for HD.  Assessment and Plan: * Acute hyponatremia Baseline serum sodium more than 130.  Present to the hospital with sodium of 125 with a fall. Management per nephrology with HD.  ESRD (end stage renal disease) [facility]) Nephrology consult for HD. Appreciate assistance.  Hypokalemia Correction with HD.  Diarrhea Reportedly have diarrhea at home. 2 episode so far this morning with very dark appearing stool.  Pad with red-tinged markings around the stool.  FOBT negative.  Has history of AV malformation as well as possible upper GI bleed and therefore not on any anticoagulation. Recently treated with antibiotics for osteomyelitis as well. Monitor.  Fall at home, initial encounter Ground-level fall this time.  Laceration in the left ring and last finger. CT head unremarkable.  X-ray left hand unremarkable. PT recommends SNF.  Paroxysmal atrial fibrillation (HCC) Essential hypertension. On amiodarone and carvedilol.  Not on anticoagulation due to recurrent fall as well as GI bleed history. Currently rate controlled.  Monitor.  Chronic heart failure with preserved ejection fraction (HFpEF) (HCC) Volume status adequate. Patient missed his HD. Management per nephrology.  Since the patient still makes urine, on torsemide at home which is currently on hold.  Anemia, chronic disease Hemoglobin stable. FOBT negative. Daily monitoring. Continue PPI.  Depressive disorder On torsemide. Does not appear to have any acute issues.  Hyperlipidemia Continue  statin.  Subjective: Feels fatigued and tired.  Had 2 episodes of loose BM dark brown/blackish in color. No nausea no vomiting.  No fever no chills.  No chest pain abdominal pain.   Physical Exam: Vitals:   09/28/21 1537 09/28/21 1920 09/28/21 1940 09/28/21 2000  BP: (!) 109/38 (!) 130/54 (!) 130/56 (!) 118/50  Pulse: 65 65 61 (!) 58  Resp: (!) 23 (!) 23 (!) 23   Temp: 98 F (36.7 C) 97.9 F (36.6 C)    TempSrc: Oral Oral    SpO2: 99% 100%    Weight:  87.5 kg    Height:       General: Appear in mild distress; no visible Abnormal Neck Mass Or lumps, Conjunctiva normal Cardiovascular: S1 and S2 Present, no Murmur, Respiratory: good respiratory effort, Bilateral Air entry present and CTA, no Crackles, no wheezes Abdomen: Bowel Sound present, Non tender  Extremities: trace Pedal edema Neurology: alert and oriented to time, place, and person, no focal deficits Gait not checked due to patient safety concerns   Data Reviewed: I have Reviewed nursing notes, Vitals, and Lab results since pt's last encounter. Pertinent lab results CBC and BMP I have ordered test including CBC and BMP I have discussed pt's care plan and test results with nephrology.   Family Communication: None at bedside  Disposition: Status is: Inpatient Remains inpatient appropriate because: Needs treatment for hyponatremia and hypokalemia with HD.  Further work-up for recurrent fall, diarrhea/melena.  Needs SNF.  Author: Berle Mull, MD 09/28/2021 8:31 PM  Please look on [email or web address] to find out who is on call.

## 2021-09-28 NOTE — Hospital Course (Signed)
PMH of ESRD on HD MWF, PAF not on anticoagulation due to recurrent fall, HTN, HLD, anemia of CKD, chronic HFpEF, chronic hyponatremia, osteomyelitis.  Presented to the hospital with complaint of generalized weakness and missing HD as well as diarrhea. Nephrology consult for HD.

## 2021-09-28 NOTE — Assessment & Plan Note (Signed)
Volume status adequate. Patient missed his HD. Management per nephrology.  Since the patient still makes urine, on torsemide at home which is currently on hold.

## 2021-09-28 NOTE — Assessment & Plan Note (Signed)
Hemoglobin stable. FOBT negative. Daily monitoring. Continue PPI.

## 2021-09-28 NOTE — Assessment & Plan Note (Deleted)
 #)   Paroxysmal atrial fibrillation: Documented history of such. In setting of CHA2DS2-VASc score of 6, there is an indication for chronic anticoagulation for thromboembolic prophylaxis.  However, in the context of the patient's documented history of recurrent falls, he is not on any blood thinning agents as an outpatient, including no formal anticoagulation.  Home AV nodal blocking regimen: None.  Rather, rhythm control strategy pursued via outpatient amiodarone.  Most recent echocardiogram occurred in September 2022, as further detailed above.. Presenting EKG flecked sinus rhythm without overt evidence of acute ischemic changes..    Plan: monitor strict I's & O's and daily weights. Repeat BMP/CBC in AM. Check serum mag level. Continue home amiodarone.

## 2021-09-28 NOTE — Evaluation (Signed)
Physical Therapy Evaluation Patient Details Name: Darryl Diaz MRN: [id number] DOB: [birth year] Today's Date: 09/28/2021  History of Present Illness  Pt is a 81 yr old who presented 09/27/21 due to generalized weakness and falls due to acute on chronic hyponatremia .  PMH: ESRD with HD MWF, afib, HTN, HLD, anemia, frequent falls, CHF, GI bleed, cardia arrest  Clinical Impression  Pt reports that he actually moved to [facility] assisted living but could not stand the food so he bought a single story home. Pt reports falling 5x over the last 2 months requiring EMS assist to get up. Pt reports he has hired help whenever he needs it but it is not scheduled, takes him grocery shopping and is assisting with unpacking of his things in this new house. Pt limited in safe mobility by his decreased acceptance of his poor balance, and inability to safely take care of himself, in presence of decreased balance posteriorly, made worse by peripheral neuropathy and L great toe amputation as well as generalized LE weakness, Pt is min guard for bed mobility, min A for transfers and modA due to LoB for short distance ambulation. PT recommending SNF level rehab to improve strength and balance. PT will continue to follow acutely.        Recommendations for follow up therapy are one component of a multi-disciplinary discharge planning process, led by the attending physician.  Recommendations may be updated based on patient status, additional functional criteria and insurance authorization.  Follow Up Recommendations Skilled nursing-short term rehab (<3 hours/day)    Assistance Recommended at Discharge Frequent or constant Supervision/Assistance  Patient can return home with the following  A lot of help with walking and/or transfers;A lot of help with bathing/dressing/bathroom;Assistance with cooking/housework;Direct supervision/assist for medications management;Direct supervision/assist for financial management;Assist  for transportation    Equipment Recommendations None recommended by PT     Functional Status Assessment Patient has had a recent decline in their functional status and demonstrates the ability to make significant improvements in function in a reasonable and predictable amount of time.     Precautions / Restrictions Precautions Precautions: Fall Precaution Comments: multiple falls and presents with bruises from falls, reports 5 falls in last 50month, calls EMS to get him up each time Restrictions Weight Bearing Restrictions: No      Mobility  Bed Mobility Overal bed mobility: Needs Assistance (Presented on BSC) Bed Mobility: Supine to Sit     Supine to sit: Min guard, HOB elevated     General bed mobility comments: min guard for safety    Transfers Overall transfer level: Needs assistance Equipment used: Rolling walker (2 wheels) Transfers: Sit to/from Stand Sit to Stand: Min assist, Min guard           General transfer comment: can not get up from bed in lowest position with bed elevated pt min guard, needig vc for hand placement, increased self steady with LE posteriorly against bed, [facility].    Ambulation/Gait Ambulation/Gait assistance: Min assist, Mod assist Gait Distance (Feet): 8 Feet (x2) Assistive device: Rolling walker (2 wheels) Gait Pattern/deviations: Step-to pattern, Decreased step length - right, Decreased step length - left, Trunk flexed, Shuffle Gait velocity: slowed Gait velocity interpretation: <1.31 ft/sec, indicative of household ambulator   General Gait Details: min A for steadying with RW, vc for proximity to be able to use UE for increased support, 1x LoB posteriorly requiring modA for  steadying        Balance Overall balance assessment: Needs assistance Sitting-balance support: Feet supported Sitting balance-Leahy Scale: Fair     Standing balance support: Bilateral  upper extremity supported Standing balance-Leahy Scale: Poor                               Pertinent Vitals/Pain Pain Assessment Pain Assessment: No/denies pain    Home Living Family/patient expects to be discharged to:: Private residence Living Arrangements: Alone Available Help at Discharge: Personal care attendant Type of Home: House ([place] [facility]) Home Access: Level entry       Home Layout: One level Home Equipment: Conservation officer, nature (2 wheels);Rollator (4 wheels) Additional Comments: Pt reports they have an aide come in about 3 hours to assist for gathering household items and assist as needed but not they call as needed. Pt reports they were still using as they were still unpacking house.    Prior Function Prior Level of Function : Independent/Modified Independent;History of Falls (last six months)                     Hand Dominance   Dominant Hand: Right    Extremity/Trunk Assessment   Upper Extremity Assessment Upper Extremity Assessment: Defer to OT evaluation    Lower Extremity Assessment Lower Extremity Assessment: Generalized weakness;RLE deficits/detail;LLE deficits/detail RLE Deficits / Details: R great toe amputation RLE Sensation: history of peripheral neuropathy LLE Sensation: history of peripheral neuropathy    Cervical / Trunk Assessment Cervical / Trunk Assessment: Kyphotic  Communication   Communication: No difficulties  Cognition Arousal/Alertness: Awake/alert Behavior During Therapy: WFL for tasks assessed/performed Overall Cognitive Status: Impaired/Different from baseline Area of Impairment: Safety/judgement, Problem solving                         Safety/Judgement: Decreased awareness of safety, Decreased awareness of deficits   Problem Solving: Slow processing, Requires verbal cues, Requires tactile cues General Comments: pt awareness of safety, especially in terms of his repeated falls and requiring EMS to come  pick him up, can not make the connection between his incontinence and his inability to safely move to clean up his bed if he has had an accident        General Comments General comments (skin integrity, edema, etc.): Bruises on L arm and shoulder, and back, unaware of his incontinence of stool and urine in bed. VSS on RA        Assessment/Plan    PT Assessment Patient needs continued PT services  PT Problem List Decreased strength;Decreased activity tolerance;Decreased balance;Decreased mobility;Decreased coordination;Decreased cognition;Decreased safety awareness;Impaired sensation       PT Treatment Interventions DME instruction;Gait training;Functional mobility training;Therapeutic activities;Therapeutic exercise;Balance training;Cognitive remediation;Patient/family education    PT Goals (Current goals can be found in the Care Plan section)  Acute Rehab PT Goals Patient Stated Goal: go home PT Goal Formulation: With patient Time For Goal Achievement: 10/12/21 Potential to Achieve Goals: Fair    Frequency Min 3X/week        AM-PAC PT "6 Clicks" Mobility  Outcome Measure Help needed turning from your back to your side while in a flat bed without using bedrails?: None Help needed moving from lying on your back to sitting on the side of a flat bed without using bedrails?: None Help needed moving to and from a bed to a chair (including a wheelchair)?: A Lot  Help needed standing up from a chair using your arms (e.g., wheelchair or bedside chair)?: A Lot Help needed to walk in hospital room?: A Lot Help needed climbing 3-5 steps with a railing? : Total 6 Click Score: 15    End of Session Equipment Utilized During Treatment: Gait belt Activity Tolerance: Patient tolerated treatment well Patient left: in chair;with call bell/phone within reach;with chair alarm set Nurse Communication: Mobility status PT Visit Diagnosis: Unsteadiness on feet (R26.81);Other abnormalities of gait  and mobility (R26.89);Muscle weakness (generalized) (M62.81);History of falling (Z91.81);Repeated falls (R29.6);Difficulty in walking, not elsewhere classified (R26.2);Other symptoms and signs involving the nervous system (Q03.474)    Time: 2595-6387 PT Time Calculation (min) (ACUTE ONLY): 35 min   Charges:   PT Evaluation $PT Eval Moderate Complexity: 1 Mod PT Treatments $Therapeutic Activity: 8-22 mins        Fusako Tanabe B. Migdalia Dk PT, DPT Acute Rehabilitation Services Please use secure chat or  Call Office [phone]   [place] 09/28/2021, 3:54 PM

## 2021-09-28 NOTE — Assessment & Plan Note (Deleted)
#)   Anemia of chronic kidney disease: Documented history of such, associated with baseline hemoglobin range of 8-11, with presenting hemoglobin consistent with this range, in the absence of any evidence of active bleed.  He is on daily oral iron supplementation as an outpatient.  Plan: Repeat CBC in the morning.  Continue home daily oral iron supplementation.

## 2021-09-29 DIAGNOSIS — F32A Depression, unspecified: Secondary | ICD-10-CM | POA: Diagnosis not present

## 2021-09-29 DIAGNOSIS — D638 Anemia in other chronic diseases classified elsewhere: Secondary | ICD-10-CM | POA: Diagnosis not present

## 2021-09-29 DIAGNOSIS — I5032 Chronic diastolic (congestive) heart failure: Secondary | ICD-10-CM | POA: Diagnosis not present

## 2021-09-29 DIAGNOSIS — E871 Hypo-osmolality and hyponatremia: Secondary | ICD-10-CM | POA: Diagnosis not present

## 2021-09-29 LAB — COMPREHENSIVE METABOLIC PANEL
ALT: 24 U/L (ref 0–44)
AST: 44 U/L — ABNORMAL HIGH (ref 15–41)
Albumin: 2.8 g/dL — ABNORMAL LOW (ref 3.5–5.0)
Alkaline Phosphatase: 124 U/L (ref 38–126)
Anion gap: 10 (ref 5–15)
BUN: 11 mg/dL (ref 8–23)
CO2: 25 mmol/L (ref 22–32)
Calcium: 8.2 mg/dL — ABNORMAL LOW (ref 8.9–10.3)
Chloride: 100 mmol/L (ref 98–111)
Creatinine, Ser: 2.18 mg/dL — ABNORMAL HIGH (ref 0.61–1.24)
GFR, Estimated: 30 mL/min — ABNORMAL LOW (ref >60)
Glucose, Bld: 143 mg/dL — ABNORMAL HIGH (ref 70–99)
Potassium: 3.4 mmol/L — ABNORMAL LOW (ref 3.5–5.1)
Sodium: 135 mmol/L (ref 135–145)
Total Bilirubin: 1.1 mg/dL (ref 0.3–1.2)
Total Protein: 5.1 g/dL — ABNORMAL LOW (ref 6.5–8.1)

## 2021-09-29 LAB — CBC
HCT: 25.5 % — ABNORMAL LOW (ref 39.0–52.0)
Hemoglobin: 9 g/dL — ABNORMAL LOW (ref 13.0–17.0)
MCH: 33.2 pg (ref 26.0–34.0)
MCHC: 35.3 g/dL (ref 30.0–36.0)
MCV: 94.1 fL (ref 80.0–100.0)
Platelets: 94 10*3/uL — ABNORMAL LOW (ref 150–400)
RBC: 2.71 MIL/uL — ABNORMAL LOW (ref 4.22–5.81)
RDW: 13.8 % (ref 11.5–15.5)
WBC: 2 10*3/uL — ABNORMAL LOW (ref 4.0–10.5)
nRBC: 0 % (ref 0.0–0.2)

## 2021-09-29 LAB — VITAMIN B1: Vitamin B1 (Thiamine): 113.6 nmol/L (ref 66.5–200.0)

## 2021-09-29 LAB — HEPATITIS B SURFACE ANTIBODY, QUANTITATIVE: Hep B S AB Quant (Post): 6.9 m[IU]/mL — ABNORMAL LOW (ref >9.9)

## 2021-09-29 LAB — MAGNESIUM: Magnesium: 1.7 mg/dL (ref 1.7–2.4)

## 2021-09-29 MED ORDER — DARBEPOETIN ALFA 150 MCG/0.3ML IJ SOSY
150.0000 ug | PREFILLED_SYRINGE | INTRAMUSCULAR | Status: DC
  Administered 2021-09-29: 150 ug via INTRAVENOUS
  Filled 2021-09-29: qty 0.3

## 2021-09-29 NOTE — NC FL2 (Signed)
[place] LEVEL OF CARE SCREENING TOOL     IDENTIFICATION  Patient Name: Darryl Diaz Birthdate: 31-Jul-1940 Sex: male Admission Date (Current Location): 09/27/2021  [facility] and [place] Number:  Herbalist and Address:  The . 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   Acute hyponatremia 09/27/2021   Incidental finding of COVID-19 virus infection 06/07/2021   Dependence on renal dialysis ([place]) 05/25/2021   Non-pressure chronic ulcer of other part of left foot with unspecified severity ([place]) 05/25/2021   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[place]) 05/25/2021   Osteomyelitis of great toe of left foot ([place])    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[place]) 04/13/2021   Cellulitis of right foot 01/17/2021   Normocytic anemia 01/17/2021   Acute lower UTI 01/17/2021   Acute GI bleeding 01/06/2021   Symptomatic anemia 12/02/2020   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   Orthostatic hypotension 11/23/2020   Acute renal insufficiency 10/23/2020   Alcohol abuse 10/23/2020   Hypokalemia 10/23/2020   Hypomagnesemia 10/23/2020   Low back pain at multiple sites 10/23/2020   Malnutrition of mild degree Altamease Oiler: 75% to less than 90% of standard weight) ([place]) 10/23/2020   Open wound of scalp without complication 74/12/1446   Oral candidiasis 10/23/2020   Cellulitis of left foot    Cellulitis of toe of left foot 10/13/2020   Skin ulcer of left great toe ([place])  10/13/2020   CKD (chronic kidney disease) stage 4, GFR 15-29 ml/min ([place]) 10/13/2020   Chronic heart failure with preserved ejection fraction (HFpEF) ([place]) 10/13/2020   Transient weakness of left lower extremity 09/07/2020   Heme positive stool 05/14/2020   Abnormality of gait 04/27/2020   Salmonella food poisoning 18/56/3149   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[place]) 03/17/2020   Yeast infection 03/16/2020   Diarrhea 03/16/2020   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 ([place])    Allergies    Anemia, chronic disease    History of hypertension    Debility    Syncope and collapse 02/22/2020   AKI (acute kidney injury) ([place]) 02/10/2020   Unspecified protein-calorie malnutrition ([place]) 02/10/2020   Weakness 02/06/2020   History of repair of hip joint 10/15/2019   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   Pain in right foot 06/27/2019   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   Bradycardia 04/20/2019   Gastrointestinal hemorrhage 02/25/2019   Fall at home, initial encounter 02/25/2019   Bilateral lower extremity edema 02/25/2019   Hyponatremia 02/25/2019   Fall 02/25/2019   Benign prostatic hyperplasia with lower urinary tract symptoms 12/19/2018   Iron deficiency anemia due to chronic blood loss 08/11/2018   Anemia 08/11/2018   Hardening of the aorta (main artery of the heart) ([place]) 08/11/2018   Benign neoplasm of colon 06/18/2018   History of adenomatous polyp of colon 08/10/2017   Impaired fasting glucose 06/10/2016   Encounter for general adult medical examination without abnormal findings 06/03/2016   Posterior dislocation of hip, closed (  [place]) 04/30/2016   Hip fracture ([place]) 03/30/2016   Hypertension 03/30/2016   Hyperlipidemia 03/30/2016   Osteoarthritis 03/30/2016   Depressive disorder 03/30/2016   Rhabdomyolysis 03/30/2016   Leukocytosis 03/30/2016   Pressure ulcer 03/30/2016   Fracture of hip ([place]) 03/30/2016   Gastroesophageal reflux disease  12/17/2015   Paroxysmal atrial fibrillation ([place]) 12/17/2015   Primary hypertension 12/17/2015   Long term (current) use of anticoagulants 12/17/2015   Moderate major depression, single episode ([place]) 12/17/2015   Pure hypercholesterolemia 12/17/2015   Seasonal allergic rhinitis 12/17/2015   CAD (coronary artery disease) 08/21/2015    Orientation RESPIRATION BLADDER Height & Weight     Self, Situation, Time, Place  Normal Continent Weight: 190 lb 0.6 oz (86.2 kg) Height:  6' (182.9 cm)  BEHAVIORAL SYMPTOMS/MOOD NEUROLOGICAL BOWEL NUTRITION STATUS      Continent Diet (See dc summary)  AMBULATORY STATUS COMMUNICATION OF NEEDS Skin   Extensive Assist Verbally Normal                       Personal Care Assistance Level of Assistance  Feeding, Bathing, Dressing Bathing Assistance: Maximum assistance Feeding assistance: Independent Dressing Assistance: Maximum assistance     Functional Limitations Info  Sight Sight Info: Impaired        SPECIAL CARE FACTORS FREQUENCY  PT (By licensed PT), OT (By licensed OT)     PT Frequency: 5x/week OT Frequency: 5x/week            Contractures Contractures Info: Not present    Additional Factors Info  Code Status, Allergies, Psychotropic Code Status Info: DNR Allergies Info: Penicillins Psychotropic Info: Lexapro         Current Medications (09/29/2021):  This is the current hospital active medication list Current Facility-Administered Medications  Medication Dose Route Frequency Provider Last Rate Last Admin   acetaminophen (TYLENOL) tablet 650 mg  650 mg Oral Q6H PRN Howerter, Justin B, DO       Or   acetaminophen (TYLENOL) suppository 650 mg  650 mg Rectal Q6H PRN Howerter, Justin B, DO       amiodarone (PACERONE) tablet 200 mg  200 mg Oral q morning Howerter, Justin B, DO   200 mg at 09/29/21 0824   atorvastatin (LIPITOR) tablet 20 mg  20 mg Oral q morning Howerter, Justin B, DO   20 mg at 09/29/21 0824   carvedilol  (COREG) tablet 6.25 mg  6.25 mg Oral BID WC Howerter, Justin B, DO   6.25 mg at 09/29/21 8841   Chlorhexidine Gluconate Cloth 2 % PADS 6 each  6 each Topical Q0600 Roney Jaffe, MD   6 each at 09/29/21 0558   Darbepoetin Alfa (ARANESP) injection 150 mcg  150 mcg Intravenous Q Wed-HD Reesa Chew, MD       escitalopram (LEXAPRO) tablet 10 mg  10 mg Oral Daily Lavina Hamman, MD   10 mg at 09/29/21 6606   ferric gluconate (FERRLECIT) 125 mg in sodium chloride 0.9 % 100 mL IVPB  125 mg Intravenous Q M,W,F-HD Roney Jaffe, MD       ferrous sulfate tablet 325 mg  325 mg Oral Q breakfast Howerter, Justin B, DO   325 mg at 30/16/01 0932   folic acid (FOLVITE) tablet 1 mg  1 mg Oral Daily Howerter, Justin B, DO   1 mg at 09/29/21 0823   pantoprazole (PROTONIX) EC tablet 40 mg  40 mg Oral Daily Lavina Hamman, MD  40 mg at 09/29/21 2924   traZODone (DESYREL) tablet 50 mg  50 mg Oral QHS Lavina Hamman, MD   50 mg at 09/28/21 2333   vitamin B-12 (CYANOCOBALAMIN) tablet 500 mcg  500 mcg Oral Daily Howerter, Justin B, DO   500 mcg at 09/29/21 4628     Discharge Medications: Please see discharge summary for a list of discharge medications.  Relevant Imaging Results:  Relevant Lab Results:   Additional Information SSN: [id number]. Covid vaccinated x3. Requires dialysis outpatient transport at [facility] on MWF with 11:50am arrival time.  Benard Halsted, LCSW

## 2021-09-29 NOTE — TOC Initial Note (Signed)
Transition of Care [facility]) - Initial/Assessment Note    Patient Details  Name: Darryl Diaz MRN: [id number] Date of Birth: August 25, 1940  Transition of Care [facility]) CM/SW Contact:    Benard Halsted, LCSW Phone Number: 09/29/2021, 2:23 PM  Clinical Narrative:                 CSW received consult for possible SNF placement at time of discharge. CSW spoke with patient. Patient reported he lives alone and has caregivers during the week that help him with household chores and takes him shopping when needed. Patient expressed understanding of PT recommendation and is agreeable to SNF placement at time of discharge. CSW discussed insurance authorization process and need to find a facility that will transport him to dialysis. CSW will provide Medicare SNF ratings list. Patient has received COVID vaccines. He normally has [facility] transportation to take him to dialysis. CSW will send out referrals for review. (Of note, patient recently left [facility] SNF AMA a few months ago so they are unable to accept him back though they report he is very sweet, but they felt he was not at a level to return to [place] level of care, which frustrated patient and he decided to move back to his home instead).   Skilled Nursing Rehab Facilities-   [email or web address]   Ratings out of 5 possible   Name Address  Phone # [place] Inspection Overall  [facility] [address] [place] '4 5 2 3  '$ [facility]  [address] [place], [place] [phone] '3 2 5 5  '$ [facility] [place], [place] '3 1 1 1  '$ [place], [place] '3 2 4 4  '$ [facility] [address] [place] '1 1 2 1  '$ [place] [address] [place] '2 1 4 3  '$ [facility] [address] [place] '5 2 3 4  '$ [facility] [address],  [place] '5 2 2 3  '$ [address] ([place]) [place], [place] [phone] '5 1 2 2  '$ [facility] [address], [place] [phone] '4 1 2 1  '$ [facility] [address] [facility] [phone] '4 1 2 1  '$ [facility] ([place]) [place]. [place], [place] [phone] '4 1 1 1  '$ Dustin Flock [address] [place] [phone] '3 2 4 4          '$ [place] '4 2 3 3  '$ [facility] [place] [address] [place] '4 1 5 4  '$ [facility], [place], [address] [phone] '2 1 1 1  '$ [facility] [address] [phone] '2 1 3 2          '$ [address] (no [facility]) [place] [address], [place] [phone] '4 5 5 5  '$ [facility] (No Humana) [address] [phone] [address], [place] '3 1 4 3  '$ [facility] (No UHC) [place], [place] '5 5 5 5  '$ [facility] [address], [address] [phone] '3 2 4 4  '$ [facility] [address], [place] '1 1 2 1  '$ [facility] [address], [address] [place] '2 1 1 1  '$ [place], [place] '5 2 4 5  '$ [place], [place] '3 1 1 1  '$ [facility] [place], [place] '2 1 2 1          '$ [facility] [address] [place] [phone] '1 1 1 1  '$ [facility] [address] [place]  [phone] '2 4 2 2  '$ [facility] [place] [address], [place] [phone] '5 2 3 4  '$ [facility] [address] [place] '2 1 1 1  '$ [place] (No Humana) [address] [place] [address] [place] [phone] '2 1 3 2  '$ [facility] [address], [place] [phone] '3 1 1 1          '$ [facility] [place], [place] '5 4 5 5   '$ [facility])  [address], [place] '2 2 3 3  '$ [facility]) [place], [place] [phone] '3 2 4 4  '$ [facility] [address] [place] '4 3 4 4  '$ [address] [place], [place] '3 3 1 1  '$ [facility]) [address] [place] [phone] '2 2 4 4     '$ Expected Discharge Plan: [place] Barriers to Discharge: Continued Medical Work up, SNF Pending bed offer   Patient Goals and CMS Choice Patient states their goals for this hospitalization and ongoing recovery are:: Rehab CMS Medicare.gov Compare Post Acute Care list provided to:: Patient Choice offered to / list presented to : Patient  Expected Discharge Plan and Services Expected Discharge Plan: [place] In-house Referral: Clinical Social Work   Post Acute Care Choice: [place] Living arrangements for the past 2 months: [place]                                      Prior Living Arrangements/Services Living arrangements for the past 2 months: Single Family Home Lives with:: Self Patient language and need for interpreter reviewed:: Yes Do you feel safe going back to the place where you live?: Yes      Need for Family Participation in Patient Care: No (Comment) Care giver support system in place?: Yes (comment) Current home services: DME (canes, W/C,2 rolators, RW) Criminal Activity/Legal Involvement Pertinent to Current Situation/Hospitalization: No - Comment as needed  Activities of Daily Living Home Assistive Devices/Equipment: Eyeglasses, Environmental consultant (specify type), Cane (specify quad or straight), Built-in shower seat, Scales (rollator) ADL Screening (condition at time of admission) Patient's cognitive ability adequate to safely complete daily activities?: Yes Is the patient deaf or have difficulty hearing?: No Does the patient have  difficulty seeing, even when wearing glasses/contacts?: No Does the patient have difficulty concentrating, remembering, or making decisions?: No Patient able to express need for assistance with ADLs?: Yes Does the patient have difficulty dressing or bathing?: No Independently performs ADLs?: Yes (appropriate for developmental age) Does the patient have difficulty walking or climbing stairs?: No (has been fallling more. Fell x2 on 5/22) Weakness of Legs: Both Weakness of Arms/Hands: None  Permission Sought/Granted Permission sought to share information with : Facility Art therapist granted to share information with : Yes, Verbal Permission Granted     Permission granted to share info w AGENCY: SNFs        Emotional Assessment Appearance:: Appears stated age Attitude/Demeanor/Rapport: Gracious Affect (typically observed): Accepting, Appropriate, Pleasant Orientation: :  Oriented to Self, Oriented to Place, Oriented to  Time, Oriented to Situation Alcohol / Substance Use: Not Applicable Psych Involvement: No (comment)  Admission diagnosis:  Acute hyponatremia [E87.1] Hyponatremia [E87.1] Balance problem [R26.89] General weakness [R53.1] Frequent falls [R29.6] Patient Active Problem List   Diagnosis Date Noted   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   Acute hyponatremia 09/27/2021   Incidental finding of COVID-19 virus infection 06/07/2021   Dependence on renal dialysis ([place]) 05/25/2021   Non-pressure chronic ulcer of other part of left foot with unspecified severity ([place]) 05/25/2021   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[place]) 05/25/2021   Osteomyelitis of great toe of left foot ([place])    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[place]) 04/13/2021   Cellulitis of right foot 01/17/2021   Normocytic anemia 01/17/2021   Acute lower UTI 01/17/2021   Acute GI bleeding 01/06/2021   Symptomatic anemia 12/02/2020   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   Orthostatic hypotension 11/23/2020   Acute renal insufficiency 10/23/2020   Alcohol abuse 10/23/2020   Hypokalemia 10/23/2020   Hypomagnesemia 10/23/2020   Low back pain at multiple sites 10/23/2020   Malnutrition of mild degree Altamease Oiler: 75% to less than 90% of standard weight) ([place]) 10/23/2020   Open wound of scalp without complication 30/86/5784   Oral candidiasis 10/23/2020   Cellulitis of left foot    Cellulitis of toe of left foot 10/13/2020   Skin ulcer of left great toe ([place]) 10/13/2020   CKD (chronic kidney disease) stage 4, 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/13/2020   Chronic heart failure with preserved ejection fraction (HFpEF) ([place]) 10/13/2020   Transient weakness of left lower extremity 09/07/2020   Heme positive stool 05/14/2020   Abnormality of gait 04/27/2020   Salmonella food poisoning 69/62/9528   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[place]) 03/17/2020   Yeast infection 03/16/2020   Diarrhea 03/16/2020   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 (HCC)    Allergies    Anemia, chronic disease    History of hypertension    Debility    Syncope and collapse 02/22/2020   AKI (acute kidney injury) ([place]) 02/10/2020   Unspecified protein-calorie malnutrition ([place]) 02/10/2020   Weakness 02/06/2020   History of repair of hip joint 10/15/2019   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   Pain in right foot 06/27/2019   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   Bradycardia 04/20/2019   Gastrointestinal hemorrhage 02/25/2019   Fall at home, initial encounter 02/25/2019   Bilateral lower extremity edema 02/25/2019   Hyponatremia 02/25/2019   Fall 02/25/2019   Benign prostatic hyperplasia with lower urinary tract symptoms 12/19/2018   Iron deficiency anemia due to chronic blood loss 08/11/2018   Anemia 08/11/2018   Hardening of the aorta (main artery of the heart) ([place]) 08/11/2018   Benign neoplasm of colon 06/18/2018   History of adenomatous  polyp of colon 08/10/2017   Impaired fasting glucose 06/10/2016   Encounter for general adult medical examination without abnormal findings 06/03/2016   Posterior dislocation of hip, closed ([place]) 04/30/2016   Hip fracture ([place]) 03/30/2016   Hypertension 03/30/2016   Hyperlipidemia 03/30/2016   Osteoarthritis 03/30/2016   Depressive disorder 03/30/2016   Rhabdomyolysis 03/30/2016   Leukocytosis 03/30/2016   Pressure ulcer 03/30/2016   Fracture of hip ([place]) 03/30/2016   Gastroesophageal reflux disease 12/17/2015   Paroxysmal atrial fibrillation ([place]) 12/17/2015   Primary hypertension 12/17/2015   Long term (current) use of anticoagulants 12/17/2015   Moderate major depression, single  episode ([place]) 12/17/2015   Pure hypercholesterolemia 12/17/2015   Seasonal allergic rhinitis 12/17/2015   CAD (coronary artery disease) 08/21/2015   PCP:  [name], MD Pharmacy:   [place], [place] [address] Phone: [phone] Fax: [phone]     Social Determinants of Health (SDOH) Interventions    Readmission Risk Interventions    09/29/2021    2:21 PM 01/18/2021    9:28 AM 03/18/2020    4:18 PM  Readmission Risk Prevention Plan  Transportation Screening Complete Complete Complete  HRI or Home Care Consult   Complete  Social Work Consult for [place] Planning/Counseling   Complete  Palliative Care Screening   Not Applicable  Medication Review Press photographer) Complete Complete Complete  PCP or Specialist appointment within 3-5 days of discharge Complete Complete   HRI or Home Care Consult Complete Complete   SW Recovery Care/Counseling Consult Complete Complete   Palliative Care Screening Not Applicable Not Applicable   Skilled Nursing Facility Complete Complete

## 2021-09-29 NOTE — Plan of Care (Signed)

## 2021-09-29 NOTE — Progress Notes (Signed)
PROGRESS NOTE        PATIENT DETAILS Name: Darryl Diaz Age: 81 y.o. Sex: male Date of Birth: 1940/05/12 Admit Date: 09/27/2021 Admitting Physician Rhetta Mura, DO GNF:[id number], [name], MD  Brief Summary: PMH of ESRD on HD MWF, PAF not on anticoagulation due to recurrent fall, HTN, HLD, anemia of CKD, chronic HFpEF, chronic hyponatremia, osteomyelitis-presented to the hospital with generalized weakness, diarrhea and missed HD.   Significant events: 5/24>> admit to [facility]-diarrhea-generalized weakness-hyponatremia  Significant studies: 5/22>> CT head: No evidence of acute intracranial abnormality 5/22>> CXR: No pulm edema or focal pulmonary consolidation. 5/22>> x-ray left hand: No fracture.  Significant microbiology data:   Procedures:   Consults: Nephrology  Subjective: Lying comfortably in bed-denies any chest pain or shortness of breath.  Objective: Vitals: Blood pressure 100/67, pulse 69, temperature 98.4 F (36.9 C), temperature source Oral, resp. rate 20, height 6' (1.829 m), weight 86.2 kg, SpO2 100 %.   Exam: Gen Exam:Alert awake-not in any distress HEENT:atraumatic, normocephalic Chest: B/L clear to auscultation anteriorly CVS:S1S2 regular Abdomen:soft non tender, non distended Extremities:no edema Neurology: Non focal Skin: no rash  Pertinent Labs/Radiology:    Latest Ref Rng & Units 09/29/2021   12:53 AM 09/28/2021    9:12 AM 09/28/2021    2:59 AM  CBC  WBC 4.0 - 10.5 K/uL 2.0   3.8   3.8    Hemoglobin 13.0 - 17.0 g/dL 9.0   10.8   9.8    Hematocrit 39.0 - 52.0 % 25.5   29.8   26.5    Platelets 150 - 400 K/uL 94   122   127      Lab Results  Component Value Date   NA 135 09/29/2021   K 3.4 (L) 09/29/2021   CL 100 09/29/2021   CO2 25 09/29/2021      Assessment/Plan: Hyponatremia: Resolved-follow periodically.  Diarrhea: Resolved.  Perhaps had a viral prodrome.  ESRD: On HD MWF-nephrology  following.  Normocytic anemia: Defer IV iron/Aranesp to nephrology.  PAF: Maintaining sinus rhythm-on amiodarone/beta-blocker.  Not on anticoagulation due to fall risk/GI bleed history.  Chronic HFpEF: Volume status stable-volume removal with HD.  HLD: On statin  Recurrent fall: Appreciate PT/OT eval-plans are for SNF on discharge.  Chronic debility/deconditioning: Walks with walker at baseline-feels much weaker than his usual baseline-SNF planned on discharge.  Depression: Stable-continue Lexapro  BMI: Estimated body mass index is 25.77 kg/m as calculated from the following:   Height as of this encounter: 6' (1.829 m).   Weight as of this encounter: 86.2 kg.   Code status:   Code Status: DNR   DVT Prophylaxis: SCDs Start: 09/27/21 2203   Family Communication: Sister [name]-on 5/24-left a Advertising account executive.   Disposition Plan: Status is: Inpatient Remains inpatient appropriate because: Improving weakness/hyponatremia-debility/deconditioning with recurrent falls-awaiting SNF placement.   Planned Discharge Destination:Skilled nursing facility   Diet: Diet Order             Diet renal with fluid restriction Room service appropriate? Yes; Fluid consistency: Thin  Diet effective now                     Antimicrobial agents: Anti-infectives (From admission, onward)    None        MEDICATIONS: Scheduled Meds:  amiodarone  200 mg Oral q morning   atorvastatin  20 mg Oral q morning   carvedilol  6.25 mg Oral BID WC   Chlorhexidine Gluconate Cloth  6 each Topical Q0600   darbepoetin (ARANESP) injection - DIALYSIS  150 mcg Intravenous Q Wed-HD   escitalopram  10 mg Oral Daily   ferrous sulfate  325 mg Oral Q breakfast   folic acid  1 mg Oral Daily   pantoprazole  40 mg Oral Daily   traZODone  50 mg Oral QHS   vitamin B-12  500 mcg Oral Daily   Continuous Infusions:  ferric gluconate (FERRLECIT) IVPB     PRN Meds:.acetaminophen **OR**  acetaminophen   I have personally reviewed following labs and imaging studies  LABORATORY DATA: CBC: Recent Labs  Lab 09/27/21 1049 09/28/21 0259 09/28/21 0912 09/29/21 0053  WBC 4.0 3.8* 3.8* 2.0*  NEUTROABS  --  2.6 2.7  --   HGB 10.6* 9.8* 10.8* 9.0*  HCT 28.8* 26.5* 29.8* 25.5*  MCV 91.1 89.8 92.3 94.1  PLT 132* 127* 122* 94*    Basic Metabolic Panel: Recent Labs  Lab 09/27/21 1049 09/27/21 2210 09/28/21 0259 09/28/21 0912 09/29/21 0053  NA 125* 123* 129* 128* 135  K 3.9 3.6 3.6 3.1* 3.4*  CL 89* 88* 93* 93* 100  CO2 21* '22 23 23 25  '$ GLUCOSE 77 88 85 122* 143*  BUN 22 25* 26* 27* 11  CREATININE 3.04* 3.16* 3.28* 3.34* 2.18*  CALCIUM 8.8* 8.3* 8.6* 8.7* 8.2*  MG  --  1.8 1.8  --  1.7  PHOS  --   --  3.1  --   --     GFR: Estimated Creatinine Clearance: 29.7 mL/min (A) (by C-G formula based on SCr of 2.18 mg/dL (H)).  Liver Function Tests: Recent Labs  Lab 09/28/21 0259 09/29/21 0053  AST 42* 44*  ALT 27 24  ALKPHOS 149* 124  BILITOT 1.8* 1.1  PROT 5.3* 5.1*  ALBUMIN 3.1* 2.8*   No results for input(s): LIPASE, AMYLASE in the last 168 hours. No results for input(s): AMMONIA in the last 168 hours.  Coagulation Profile: Recent Labs  Lab 09/28/21 0348  INR 1.2    Cardiac Enzymes: No results for input(s): CKTOTAL, CKMB, CKMBINDEX, TROPONINI in the last 168 hours.  BNP (last 3 results) No results for input(s): PROBNP in the last 8760 hours.  Lipid Profile: No results for input(s): CHOL, HDL, LDLCALC, TRIG, CHOLHDL, LDLDIRECT in the last 72 hours.  Thyroid Function Tests: Recent Labs    09/28/21 0348  TSH 4.189    Anemia Panel: No results for input(s): VITAMINB12, FOLATE, FERRITIN, TIBC, IRON, RETICCTPCT in the last 72 hours.  Urine analysis:    Component Value Date/Time   COLORURINE YELLOW 09/27/2021 [place] 09/27/2021 2245   LABSPEC 1.005 09/27/2021 2245   PHURINE 8.0 09/27/2021 2245   GLUCOSEU NEGATIVE  09/27/2021 2245   HGBUR MODERATE (A) 09/27/2021 2245   BILIRUBINUR NEGATIVE 09/27/2021 2245   KETONESUR NEGATIVE 09/27/2021 2245   PROTEINUR NEGATIVE 09/27/2021 2245   NITRITE NEGATIVE 09/27/2021 2245   LEUKOCYTESUR NEGATIVE 09/27/2021 2245    Sepsis Labs: Lactic Acid, Venous    Component Value Date/Time   LATICACIDVEN 2.5 (HH) 06/03/2021 1602    MICROBIOLOGY: No results found for this or any previous visit (from the past 240 hour(s)).  RADIOLOGY STUDIES/RESULTS: No results found.   LOS: 2 days   Oren Binet, MD  [facility]    To contact the attending provider between 7A-7P or the covering  provider during after hours 7P-7A, please log into the web site [email or web address] and access using universal [place] password for that web site. If you do not have the password, please call the hospital operator.  09/29/2021, 2:38 PM

## 2021-09-29 NOTE — Care Management Important Message (Signed)
Important Message  Patient Details  Name: Darryl Diaz MRN: [id number] Date of Birth: 14-Jul-1940   Medicare Important Message Given:  Yes     Orbie Pyo 09/29/2021, 2:47 PM

## 2021-09-29 NOTE — Progress Notes (Signed)
Nephrology Follow-Up Consult note   Assessment/Recommendations: Darryl Diaz is a/an 81 y.o. male with a past medical history significant for ESRD, admitted for weakness and falls.        OP HD: [facility] MWF  4h  350/ 1.5  84.5kg  2/2 bath LUA AVF   Hep none - last HD 5/19, 81.5 > 81kg - mircera 150 ug q2, not started yet - venofer 100 mg tiw 5/19- 5/24, 1 of 3 given     Assessment/ Plan: Gen'd weakness/ falls - similar admit in Feb 2023 w/ falls and 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  Likely ongoing deconditioning.  Management per primary team ESRD - missed HD yesterday (5/22).  Short dialysis session yesterday.  Plan for full dialysis session today to maintain MWF schedule HTN / vol -blood pressure acceptable at this time.  Continue home medications and ultrafiltration with dialysis Anemia esrd - Hb 9. Restart darbe 150 weekly, as OP unit was planning to resume esa w/ mircera 150 q 2wks. Will cont IV Fe load w/ 2 more sessions.  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 Cont binders.  Atrial fib - on amio and BB Depression/ anxiety Etoh use disorder   Recommendations conveyed to primary service.    [facility] 09/29/2021 10:09 AM  ___________________________________________________________  CC: Weakness  Interval History/Subjective: Patient underwent short dialysis session yesterday with no issues.  States he is feeling little bit better today.  Denies any other problems   Medications:  Current Facility-Administered Medications  Medication Dose Route Frequency Provider Last Rate Last Admin   acetaminophen (TYLENOL) tablet 650 mg  650 mg Oral Q6H PRN Howerter, Justin B, DO       Or   acetaminophen (TYLENOL) suppository 650 mg  650 mg Rectal Q6H PRN Howerter, Justin B, DO       amiodarone (PACERONE) tablet 200 mg  200 mg Oral q morning Howerter, Justin B, DO   200 mg at 09/29/21 0824   atorvastatin (LIPITOR) tablet 20 mg  20 mg Oral q morning  Howerter, Justin B, DO   20 mg at 09/29/21 0824   carvedilol (COREG) tablet 6.25 mg  6.25 mg Oral BID WC Howerter, Justin B, DO   6.25 mg at 09/29/21 8469   Chlorhexidine Gluconate Cloth 2 % PADS 6 each  6 each Topical Q0600 Roney Jaffe, MD   6 each at 09/29/21 0558   escitalopram (LEXAPRO) tablet 10 mg  10 mg Oral Daily Lavina Hamman, MD   10 mg at 09/29/21 6295   ferric gluconate (FERRLECIT) 125 mg in sodium chloride 0.9 % 100 mL IVPB  125 mg Intravenous Q M,W,F-HD Roney Jaffe, MD       ferrous sulfate tablet 325 mg  325 mg Oral Q breakfast Howerter, Justin B, DO   325 mg at 28/41/32 4401   folic acid (FOLVITE) tablet 1 mg  1 mg Oral Daily Howerter, Justin B, DO   1 mg at 09/29/21 0823   pantoprazole (PROTONIX) EC tablet 40 mg  40 mg Oral Daily Lavina Hamman, MD   40 mg at 09/29/21 0272   traZODone (DESYREL) tablet 50 mg  50 mg Oral QHS Lavina Hamman, MD   50 mg at 09/28/21 2333   vitamin B-12 (CYANOCOBALAMIN) tablet 500 mcg  500 mcg Oral Daily Howerter, Justin B, DO   500 mcg at 09/29/21 5366      Review of Systems: 10 systems  reviewed and negative except per interval history/subjective  Physical Exam: Vitals:   09/29/21 0352 09/29/21 0808  BP: (!) 138/49 100/67  Pulse: 66 69  Resp: 15 20  Temp: 98.6 F (37 C) 98.4 F (36.9 C)  SpO2: 94% 100%   No intake/output data recorded.  Intake/Output Summary (Last 24 hours) at 09/29/2021 1009 Last data filed at 09/29/2021 9150 Gross per 24 hour  Intake --  Output 2225 ml  Net -2225 ml   Constitutional: Chronically ill-appearing, no acute distress ENMT: ears and nose without scars or lesions, MMM CV: normal rate, no edema at the ankles Respiratory: Bilateral chest rise, normal work of breathing Gastrointestinal: soft, non-tender, no palpable masses or hernias Skin: no visible lesions or rashes Psych: alert, judgement/insight appropriate, appropriate mood and affect   Test Results I personally reviewed new and old  clinical labs and radiology tests Lab Results  Component Value Date   NA 135 09/29/2021   K 3.4 (L) 09/29/2021   CL 100 09/29/2021   CO2 25 09/29/2021   BUN 11 09/29/2021   CREATININE 2.18 (H) 09/29/2021   CALCIUM 8.2 (L) 09/29/2021   ALBUMIN 2.8 (L) 09/29/2021   PHOS 3.1 09/28/2021    CBC Recent Labs  Lab 09/28/21 0259 09/28/21 0912 09/29/21 0053  WBC 3.8* 3.8* 2.0*  NEUTROABS 2.6 2.7  --   HGB 9.8* 10.8* 9.0*  HCT 26.5* 29.8* 25.5*  MCV 89.8 92.3 94.1  PLT 127* 122* 94*

## 2021-09-30 DIAGNOSIS — I480 Paroxysmal atrial fibrillation: Secondary | ICD-10-CM | POA: Diagnosis not present

## 2021-09-30 DIAGNOSIS — D631 Anemia in chronic kidney disease: Secondary | ICD-10-CM | POA: Diagnosis not present

## 2021-09-30 DIAGNOSIS — Z992 Dependence on renal dialysis: Secondary | ICD-10-CM | POA: Diagnosis not present

## 2021-09-30 DIAGNOSIS — N2581 Secondary hyperparathyroidism of renal origin: Secondary | ICD-10-CM | POA: Diagnosis not present

## 2021-09-30 DIAGNOSIS — K219 Gastro-esophageal reflux disease without esophagitis: Secondary | ICD-10-CM | POA: Diagnosis not present

## 2021-09-30 DIAGNOSIS — E876 Hypokalemia: Secondary | ICD-10-CM | POA: Diagnosis not present

## 2021-09-30 DIAGNOSIS — D509 Iron deficiency anemia, unspecified: Secondary | ICD-10-CM | POA: Diagnosis not present

## 2021-09-30 DIAGNOSIS — R2689 Other abnormalities of gait and mobility: Secondary | ICD-10-CM | POA: Diagnosis not present

## 2021-09-30 DIAGNOSIS — D638 Anemia in other chronic diseases classified elsewhere: Secondary | ICD-10-CM | POA: Diagnosis not present

## 2021-09-30 DIAGNOSIS — R42 Dizziness and giddiness: Secondary | ICD-10-CM | POA: Diagnosis not present

## 2021-09-30 DIAGNOSIS — M1990 Unspecified osteoarthritis, unspecified site: Secondary | ICD-10-CM | POA: Diagnosis not present

## 2021-09-30 DIAGNOSIS — G4700 Insomnia, unspecified: Secondary | ICD-10-CM | POA: Diagnosis not present

## 2021-09-30 DIAGNOSIS — I10 Essential (primary) hypertension: Secondary | ICD-10-CM | POA: Diagnosis not present

## 2021-09-30 DIAGNOSIS — I5032 Chronic diastolic (congestive) heart failure: Secondary | ICD-10-CM | POA: Diagnosis not present

## 2021-09-30 DIAGNOSIS — F32A Depression, unspecified: Secondary | ICD-10-CM | POA: Diagnosis not present

## 2021-09-30 DIAGNOSIS — Z23 Encounter for immunization: Secondary | ICD-10-CM | POA: Diagnosis not present

## 2021-09-30 DIAGNOSIS — N186 End stage renal disease: Secondary | ICD-10-CM | POA: Diagnosis not present

## 2021-09-30 DIAGNOSIS — Z9181 History of falling: Secondary | ICD-10-CM | POA: Diagnosis not present

## 2021-09-30 DIAGNOSIS — Z7401 Bed confinement status: Secondary | ICD-10-CM | POA: Diagnosis not present

## 2021-09-30 DIAGNOSIS — M1611 Unilateral primary osteoarthritis, right hip: Secondary | ICD-10-CM | POA: Diagnosis not present

## 2021-09-30 DIAGNOSIS — E785 Hyperlipidemia, unspecified: Secondary | ICD-10-CM | POA: Diagnosis not present

## 2021-09-30 DIAGNOSIS — I120 Hypertensive chronic kidney disease with stage 5 chronic kidney disease or end stage renal disease: Secondary | ICD-10-CM | POA: Diagnosis not present

## 2021-09-30 DIAGNOSIS — I129 Hypertensive chronic kidney disease with stage 1 through stage 4 chronic kidney disease, or unspecified chronic kidney disease: Secondary | ICD-10-CM | POA: Diagnosis not present

## 2021-09-30 DIAGNOSIS — F411 Generalized anxiety disorder: Secondary | ICD-10-CM | POA: Diagnosis not present

## 2021-09-30 DIAGNOSIS — M6281 Muscle weakness (generalized): Secondary | ICD-10-CM | POA: Diagnosis not present

## 2021-09-30 DIAGNOSIS — K7030 Alcoholic cirrhosis of liver without ascites: Secondary | ICD-10-CM | POA: Diagnosis not present

## 2021-09-30 DIAGNOSIS — M6282 Rhabdomyolysis: Secondary | ICD-10-CM | POA: Diagnosis not present

## 2021-09-30 DIAGNOSIS — E871 Hypo-osmolality and hyponatremia: Secondary | ICD-10-CM | POA: Diagnosis not present

## 2021-09-30 DIAGNOSIS — R000 Tachycardia, unspecified: Secondary | ICD-10-CM | POA: Diagnosis not present

## 2021-09-30 DIAGNOSIS — R41841 Cognitive communication deficit: Secondary | ICD-10-CM | POA: Diagnosis not present

## 2021-09-30 LAB — COMPREHENSIVE METABOLIC PANEL
ALT: 22 U/L (ref 0–44)
AST: 34 U/L (ref 15–41)
Albumin: 2.8 g/dL — ABNORMAL LOW (ref 3.5–5.0)
Alkaline Phosphatase: 125 U/L (ref 38–126)
Anion gap: 4 — ABNORMAL LOW (ref 5–15)
BUN: 13 mg/dL (ref 8–23)
CO2: 30 mmol/L (ref 22–32)
Calcium: 8.3 mg/dL — ABNORMAL LOW (ref 8.9–10.3)
Chloride: 101 mmol/L (ref 98–111)
Creatinine, Ser: 2.12 mg/dL — ABNORMAL HIGH (ref 0.61–1.24)
GFR, Estimated: 31 mL/min — ABNORMAL LOW (ref >60)
Glucose, Bld: 101 mg/dL — ABNORMAL HIGH (ref 70–99)
Potassium: 3.3 mmol/L — ABNORMAL LOW (ref 3.5–5.1)
Sodium: 135 mmol/L (ref 135–145)
Total Bilirubin: 0.5 mg/dL (ref 0.3–1.2)
Total Protein: 5.2 g/dL — ABNORMAL LOW (ref 6.5–8.1)

## 2021-09-30 LAB — CALCIUM, IONIZED: Calcium, Ionized, Serum: 4.8 mg/dL (ref 4.5–5.6)

## 2021-09-30 LAB — MAGNESIUM: Magnesium: 1.7 mg/dL (ref 1.7–2.4)

## 2021-09-30 LAB — METHYLMALONIC ACID, SERUM: Methylmalonic Acid, Quantitative: 314 nmol/L (ref 0–378)

## 2021-09-30 MED ORDER — MAGNESIUM SULFATE IN D5W 1-5 GM/100ML-% IV SOLN
1.0000 g | Freq: Once | INTRAVENOUS | Status: AC
Start: 2021-09-30 — End: 2021-09-30
  Administered 2021-09-30: 1 g via INTRAVENOUS
  Filled 2021-09-30: qty 100

## 2021-09-30 MED ORDER — POTASSIUM CHLORIDE CRYS ER 20 MEQ PO TBCR
40.0000 meq | EXTENDED_RELEASE_TABLET | Freq: Once | ORAL | Status: AC
  Administered 2021-09-30: 40 meq via ORAL
  Filled 2021-09-30: qty 2

## 2021-09-30 NOTE — Progress Notes (Signed)
Called [facility] and gave report to Chase. No questions at this time

## 2021-09-30 NOTE — Progress Notes (Signed)
Tried to call report twice to [facility]. No answer at this time. I will try to call later.

## 2021-09-30 NOTE — Consult Note (Signed)
[facility] Inpatient Consult   09/30/2021  Darryl Diaz 1941/01/12 [id number]  [place] Management [facility] CM)   Patient chart has been reviewed with noted extreme high risk score for unplanned readmissions.  Patient assessed for community [place] Management follow up needs. Per review, current disposition is for SNF.   Plan: If patient transitions to SNF in [place], will notify post acute care coordinator for follow up.  Of note, [facility] Management services does not replace or interfere with any services that are arranged by inpatient case management or social work.    [name], MSN, RN [place] [facility] Liaison Phone [phone] Toll free office [phone]

## 2021-09-30 NOTE — Progress Notes (Signed)
[facility] Progress Note   Subjective:   Patient seen and examined in room.  Reports feeling better today, plans to d/c to SNF later.  Ambulated with a walker without dizziness or weakness.  Denies CP, SOB, abdominal pain and n/v/d.   Objective Vitals:   09/30/21 0200 09/30/21 0300 09/30/21 0400 09/30/21 0831  BP:   (!) 135/38 (!) 113/56  Pulse: 61 60 (!) 59 67  Resp: '19 19 20 17  '$ Temp:   98.6 F (37 C) (!) 97.3 F (36.3 C)  TempSrc:   Oral Oral  SpO2: 96% 95% 95% 99%  Weight:      Height:       Physical Exam General:chronically ill appearing male in NAD Heart:RRR, no mrg Lungs:CTAB, nml WOB on RA Abdomen:soft, NTND Extremities:trace LE edema Dialysis Access: LU AVF +b/t   Filed Weights   09/28/21 2245 09/29/21 0349 09/29/21 1425  Weight: 85.5 kg 86.2 kg 84.3 kg    Intake/Output Summary (Last 24 hours) at 09/30/2021 1027 Last data filed at 09/29/2021 1950 Gross per 24 hour  Intake 250 ml  Output 2482 ml  Net -2232 ml    Additional Objective Labs: Basic Metabolic Panel: Recent Labs  Lab 09/28/21 0259 09/28/21 0912 09/29/21 0053 09/30/21 0356  NA 129* 128* 135 135  K 3.6 3.1* 3.4* 3.3*  CL 93* 93* 100 101  CO2 '23 23 25 30  '$ GLUCOSE 85 122* 143* 101*  BUN 26* 27* 11 13  CREATININE 3.28* 3.34* 2.18* 2.12*  CALCIUM 8.6* 8.7* 8.2* 8.3*  PHOS 3.1  --   --   --    Liver Function Tests: Recent Labs  Lab 09/28/21 0259 09/29/21 0053 09/30/21 0356  AST 42* 44* 34  ALT '27 24 22  '$ ALKPHOS 149* 124 125  BILITOT 1.8* 1.1 0.5  PROT 5.3* 5.1* 5.2*  ALBUMIN 3.1* 2.8* 2.8*   CBC: Recent Labs  Lab 09/27/21 1049 09/28/21 0259 09/28/21 0912 09/29/21 0053  WBC 4.0 3.8* 3.8* 2.0*  NEUTROABS  --  2.6 2.7  --   HGB 10.6* 9.8* 10.8* 9.0*  HCT 28.8* 26.5* 29.8* 25.5*  MCV 91.1 89.8 92.3 94.1  PLT 132* 127* 122* 94*    Medications:  ferric gluconate (FERRLECIT) IVPB Stopped (09/29/21 1850)   magnesium sulfate bolus IVPB 1 g (09/30/21 0928)     amiodarone  200 mg Oral q morning   atorvastatin  20 mg Oral q morning   carvedilol  6.25 mg Oral BID WC   Chlorhexidine Gluconate Cloth  6 each Topical Q0600   darbepoetin (ARANESP) injection - DIALYSIS  150 mcg Intravenous Q Wed-HD   escitalopram  10 mg Oral Daily   ferrous sulfate  325 mg Oral Q breakfast   folic acid  1 mg Oral Daily   pantoprazole  40 mg Oral Daily   traZODone  50 mg Oral QHS   vitamin B-12  500 mcg Oral Daily    Dialysis Orders: [facility] MWF  4h  350/ 1.5  84.5kg  2/2 bath LUA AVF   Hep none - last HD 5/19, 81.5 > 81kg - mircera 150 ug q2, not started yet - venofer 100 mg tiw 5/19- 5/24, 1 of 3 given     Assessment/ Plan: Generalized weakness/ falls - similar admit in Feb 2023 w/ falls and 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  Likely d/t ongoing deconditioning. Plans to d/c to SNF for rehab. Per PMD ESRD - missed HD x1 this week.  HD tomorrow per regular schedule.  HTN / vol - BP in goal. Continue home medications.  Does not appear volume overloaded, continue UF to dry weight.  Anemia esrd - Hb 9. Given '150mg'$  aranesp w/HD yesterday and '125mg'$  IV iron.  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 Cont binders.  Atrial fib - on amio and BB Depression/ anxiety Etoh use disorder Dispo - ok for d/c from renal standpoint  Jen Mow, PA-C [place] 09/30/2021,10:27 AM  LOS: 3 days

## 2021-09-30 NOTE — TOC Transition Note (Signed)
Transition of Care [facility]) - CM/SW Discharge Note   Patient Details  Name: Darryl Diaz MRN: [id number] Date of Birth: December 11, 1940  [facility] CM/SW Contact:  Benard Halsted, [place] Phone Number: 09/30/2021, 12:38 PM   Clinical Narrative:    Patient will DC to: [facility] Anticipated DC date: 09/30/21 Family notified: Pt notified sister and caregivers to bring clothes Transport by: Corey Harold   Per MD patient ready for DC to [facility]. RN to call report prior to discharge [phone]). RN, patient, patient's family, and facility notified of DC. Discharge Summary and FL2 sent to facility. DC packet on chart. Ambulance transport requested for patient.   CSW will sign off for now as social work intervention is no longer needed. Please consult [place] again if new needs arise.     Final next level of care: Skilled Nursing Facility Barriers to Discharge: Barriers Resolved   Patient Goals and CMS Choice Patient states their goals for this hospitalization and ongoing recovery are:: Rehab CMS Medicare.gov Compare Post Acute Care list provided to:: Patient Choice offered to / list presented to : Patient  Discharge Placement   Existing PASRR number confirmed : 09/30/21          Patient chooses bed at: [facility] Patient to be transferred to facility by: [facility] Name of family member notified: Pt notified sister Patient and family notified of of transfer: 09/30/21  Discharge Plan and Services In-house Referral: Clinical Social Work   Post Acute Care Choice: [facility]                               Social Determinants of Health (SDOH) Interventions     Readmission Risk Interventions    09/29/2021    2:21 PM 01/18/2021    9:28 AM 03/18/2020    4:18 PM  Readmission Risk Prevention Plan  Transportation Screening Complete Complete Complete  HRI or Home Care Consult   Complete  Social Work Consult for [place]  Planning/Counseling   Complete  Palliative Care Screening   Not Applicable  Medication Review Press photographer) Complete Complete Complete  PCP or Specialist appointment within 3-5 days of discharge Complete Complete   HRI or Home Care Consult Complete Complete   SW Recovery Care/Counseling Consult Complete Complete   Palliative Care Screening Not Applicable Not Applicable   Skilled Nursing Facility Complete Complete

## 2021-09-30 NOTE — Discharge Summary (Signed)
PATIENT DETAILS Name: Darryl Diaz Age: 81 y.o. Sex: male Date of Birth: 07/11/1940 MRN: [id number]. Admitting Physician: Rhetta Mura, DO [id number], [name], MD  Admit Date: 09/27/2021 Discharge date: 09/30/2021  Recommendations for Outpatient Follow-up:  Follow up with PCP in 1-2 weeks Please obtain CMP/CBC in one week   Admitted From:  Home   Disposition: Skilled nursing facility   Discharge Condition: fair  CODE STATUS:   Code Status: DNR   Diet recommendation:  Diet Order             Diet - low sodium heart healthy           Diet renal with fluid restriction Room service appropriate? Yes; Fluid consistency: Thin  Diet effective now                    Brief Summary: PMH of ESRD on HD MWF, PAF not on anticoagulation due to recurrent fall, HTN, HLD, anemia of CKD, chronic HFpEF, chronic hyponatremia, osteomyelitis-presented to the hospital with generalized weakness, diarrhea and missed HD.     Significant events: 5/24>> admit to [facility]-diarrhea-generalized weakness-hyponatremia   Significant studies: 5/22>> CT head: No evidence of acute intracranial abnormality 5/22>> CXR: No pulm edema or focal pulmonary consolidation. 5/22>> x-ray left hand: No fracture.   Significant microbiology data:     Procedures:     Consults: Nephrology  Brief Hospital Course: Hyponatremia: Resolved-follow periodically.   Diarrhea: Resolved.  Perhaps had a viral prodrome.   ESRD: On HD MWF-nephrology followed closely.  Please ensure resumption of hemodialysis in the outpatient line.  Normocytic anemia: Defer IV iron/Aranesp to nephrology.   PAF: Maintaining sinus rhythm-on amiodarone/beta-blocker.  Not on anticoagulation due to fall risk/GI bleed history.   Chronic HFpEF: Volume status stable-volume removal with HD.   HLD: On statin   Recurrent fall: Appreciate PT/OT eval-plans are for SNF on discharge.   Chronic debility/deconditioning: Walks with  walker at baseline-feels much weaker than his usual baseline-SNF planned on discharge.   Depression: Stable-continue Lexapro   BMI: Estimated body mass index is 25.77 kg/m as calculated from the following:   Height as of this encounter: 6' (1.829 m).   Weight as of this encounter: 86.2 kg.    Discharge Diagnoses:  Principal Problem:   Acute hyponatremia Active Problems:   Hypokalemia   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[place])   Diarrhea   H/O arteriovenous malformation (AVM)   Fall at home, initial encounter   Paroxysmal atrial fibrillation ([place])   Hypertension   Hyperlipidemia   Depressive disorder   Anemia, chronic disease   Chronic heart failure with preserved ejection fraction (HFpEF) ([place])   Discharge Instructions:  Activity:  As tolerated   Discharge Instructions     Call MD for:  extreme fatigue   Complete by: As directed    Call MD for:  persistant dizziness or light-headedness   Complete by: As directed    Diet - low sodium heart healthy   Complete by: As directed    Discharge instructions   Complete by: As directed    Follow with Primary MD  Tisovec, [name], MD in 1-2 weeks  Please get a complete blood count and chemistry panel checked by your Primary MD at your next visit, and again as instructed by your Primary MD.  Get Medicines reviewed and adjusted: Please take all your medications with you for your next visit with your Primary MD  Laboratory/radiological data: Please request your Primary  MD to go over all hospital tests and procedure/radiological results at the follow up, please ask your Primary MD to get all Hospital records sent to his/her office.  In some cases, they will be blood work, cultures and biopsy results pending at the time of your discharge. Please request that your primary care M.D. follows up on these results.  Also Note the following: If you experience worsening of your admission symptoms, develop shortness of breath, life  threatening emergency, suicidal or homicidal thoughts you must seek medical attention immediately by calling 911 or calling your MD immediately  if symptoms less severe.  You must read complete instructions/literature along with all the possible adverse reactions/side effects for all the Medicines you take and that have been prescribed to you. Take any new Medicines after you have completely understood and accpet all the possible adverse reactions/side effects.   Do not drive when taking Pain medications or sleeping medications (Benzodaizepines)  Do not take more than prescribed Pain, Sleep and Anxiety Medications. It is not advisable to combine anxiety,sleep and pain medications without talking with your primary care practitioner  Special Instructions: If you have smoked or chewed Tobacco  in the last 2 yrs please stop smoking, stop any regular Alcohol  and or any Recreational drug use.  Wear Seat belts while driving.  Please note: You were cared for by a hospitalist during your hospital stay. Once you are discharged, your primary care physician will handle any further medical issues. Please note that NO REFILLS for any discharge medications will be authorized once you are discharged, as it is imperative that you return to your primary care physician (or establish a relationship with a primary care physician if you do not have one) for your post hospital discharge needs so that they can reassess your need for medications and monitor your lab values.   Increase activity slowly   Complete by: As directed    No wound care   Complete by: As directed       Allergies as of 09/30/2021       Reactions   Penicillins Rash        Medication List     STOP taking these medications    HYDROcodone-acetaminophen 5-325 MG tablet Commonly known as: NORCO/VICODIN   tamsulosin 0.4 MG Caps capsule Commonly known as: FLOMAX   torsemide 20 MG tablet Commonly known as: DEMADEX       TAKE these  medications    acetaminophen 325 MG tablet Commonly known as: TYLENOL Take 2 tablets (650 mg total) by mouth every 4 (four) hours as needed for headache or mild pain. What changed:  how much to take reasons to take this   albuterol (2.5 MG/3ML) 0.083% nebulizer solution Commonly known as: PROVENTIL Take 3 mLs (2.5 mg total) by nebulization every 6 (six) hours as needed for wheezing or shortness of breath.   amiodarone 200 MG tablet Commonly known as: PACERONE TAKE 1 TABLET BY MOUTH ONCE DAILY What changed: when to take this   atorvastatin 20 MG tablet Commonly known as: LIPITOR Take 1 tablet (20 mg total) by mouth daily. What changed: when to take this   carvedilol 6.25 MG tablet Commonly known as: COREG Take 1 tablet (6.25 mg total) by mouth 2 (two) times daily.   Centrum Silver 50+Men Tabs Take 1 tablet by mouth every other day.   cetirizine 10 MG tablet Commonly known as: ZYRTEC Take 10 mg by mouth daily.   escitalopram 10 MG tablet Commonly  known as: LEXAPRO Take 1 tablet (10 mg total) by mouth daily. What changed: when to take this   ferrous sulfate 324 MG Tbec Take 324 mg by mouth every morning.   FOLIC ACID PO Take 1 tablet by mouth every morning.   Magnesium Oxide 400 MG Caps Take 1 capsule (400 mg total) by mouth daily.   nitroGLYCERIN 0.4 MG SL tablet Commonly known as: NITROSTAT Place 1 tablet (0.4 mg total) under the tongue every 5 (five) minutes x 3 doses as needed for chest pain.   pantoprazole 40 MG tablet Commonly known as: PROTONIX Take 1 tablet (40 mg total) by mouth daily. What changed: when to take this   traZODone 50 MG tablet Commonly known as: DESYREL Take 1 tablet (50 mg total) by mouth at bedtime. What changed:  when to take this reasons to take this   vitamin B-12 500 MCG tablet Commonly known as: CYANOCOBALAMIN Take 1 tablet (500 mcg total) by mouth daily.        Follow-up Information     Tisovec, [name], MD.  Schedule an appointment as soon as possible for a visit in 1 week(s).   Specialty: Internal Medicine Contact information: [place] [address] [phone]         Sherren Mocha, MD Follow up in 2 week(s).   Specialty: Cardiology Contact information: [address] [place] [address] [phone]         Deboraha Sprang, MD .   Specialty: Cardiology Contact information: [phone] [address] [place] [address] [phone]         Hemodialysis center Follow up.   Why: Please follow at your regular hemodialysis center at your usual schedule.               Allergies  Allergen Reactions   Penicillins Rash     Other Procedures/Studies: CT Head Wo Contrast  Result Date: 09/27/2021 CLINICAL DATA:  Head trauma, minor (Age >= 65y) EXAM: CT HEAD WITHOUT CONTRAST TECHNIQUE: Contiguous axial images were obtained from the base of the skull through the vertex without intravenous contrast. RADIATION DOSE REDUCTION: This exam was performed according to the departmental dose-optimization program which includes automated exposure control, adjustment of the mA and/or kV according to patient size and/or use of iterative reconstruction technique. COMPARISON:  None Available. FINDINGS: Brain: No evidence of acute infarction, hemorrhage, hydrocephalus, extra-axial collection or mass lesion/mass effect. Cerebral atrophy. Vascular: Calcific intracranial atherosclerosis. No hyperdense vessel identified. Skull: No acute fracture. Sinuses/Orbits: Visualized sinuses are clear. No acute orbital findings. Other: Trace left mastoid effusion. IMPRESSION: 1. No evidence of acute intracranial abnormality. 2. 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 Electronically Signed   By: Margaretha Sheffield M.D.   On: 09/27/2021 11:56   DG Chest Port 1 View  Result Date: 09/27/2021 CLINICAL DATA:  Shortness of breath EXAM: PORTABLE CHEST 1 VIEW COMPARISON:  Previous  studies including the examination of 06/07/2021 FINDINGS: Transverse diameter of heart is increased. There are linear densities in the lower lung fields. There are no signs of pulmonary edema or focal pulmonary consolidation. There is no pleural effusion or pneumothorax. IMPRESSION: Cardiomegaly. Linear densities in the lower lung fields suggest scarring or subsegmental atelectasis. There are no signs of pulmonary edema or focal pulmonary consolidation. Electronically Signed   By: Elmer Picker M.D.   On: 09/27/2021 14:06   DG Hand Complete Left  Result Date: 09/27/2021 CLINICAL DATA:  Trauma, fall EXAM: LEFT HAND - COMPLETE  3+ VIEW COMPARISON:  None Available. FINDINGS: No recent fracture or dislocation is seen. Marked degenerative changes are noted in first carpometacarpal joint. Degenerative changes are noted in multiple interphalangeal joints. Soft tissue swelling is noted adjacent to the interphalangeal joints, more so in the PIP joint of the middle finger. There are no opaque foreign bodies. IMPRESSION: No recent fracture or dislocation is seen. Degenerative changes are noted in multiple joints as described in the body of the report. Electronically Signed   By: Elmer Picker M.D.   On: 09/27/2021 14:09     TODAY-DAY OF DISCHARGE:  Subjective:   [name] today has no headache,no chest abdominal pain,no new weakness tingling or numbness, feels much better wants to go home today.  Objective:   Blood pressure (!) 113/56, pulse 67, temperature (!) 97.3 F (36.3 C), temperature source Oral, resp. rate 17, height 6' (1.829 m), weight 84.3 kg, SpO2 99 %.  Intake/Output Summary (Last 24 hours) at 09/30/2021 0919 Last data filed at 09/29/2021 1950 Gross per 24 hour  Intake 250 ml  Output 2482 ml  Net -2232 ml   Filed Weights   09/28/21 2245 09/29/21 0349 09/29/21 1425  Weight: 85.5 kg 86.2 kg 84.3 kg    Exam: Awake Alert, Oriented *3, No new F.N deficits, Normal  affect [place].AT,PERRAL Supple Neck,No JVD, No cervical lymphadenopathy appriciated.  Symmetrical Chest wall movement, Good air movement bilaterally, CTAB RRR,No Gallops,Rubs or new Murmurs, No Parasternal Heave +ve B.Sounds, Abd Soft, Non tender, No organomegaly appriciated, No rebound -guarding or rigidity. No Cyanosis, Clubbing or edema, No new Rash or bruise   PERTINENT RADIOLOGIC STUDIES: No results found.   PERTINENT LAB RESULTS: CBC: Recent Labs    09/28/21 0912 09/29/21 0053  WBC 3.8* 2.0*  HGB 10.8* 9.0*  HCT 29.8* 25.5*  PLT 122* 94*   CMET CMP     Component Value Date/Time   NA 135 09/30/2021 0356   NA 133 (L) 09/03/2020 1612   K 3.3 (L) 09/30/2021 0356   CL 101 09/30/2021 0356   CO2 30 09/30/2021 0356   GLUCOSE 101 (H) 09/30/2021 0356   BUN 13 09/30/2021 0356   BUN 27 09/03/2020 1612   CREATININE 2.12 (H) 09/30/2021 0356   CALCIUM 8.3 (L) 09/30/2021 0356   PROT 5.2 (L) 09/30/2021 0356   PROT 5.7 (L) 06/03/2020 1217   ALBUMIN 2.8 (L) 09/30/2021 0356   ALBUMIN 3.4 (L) 06/03/2020 1217   AST 34 09/30/2021 0356   ALT 22 09/30/2021 0356   ALKPHOS 125 09/30/2021 0356   BILITOT 0.5 09/30/2021 0356   BILITOT 0.5 06/03/2020 1217   GFRNONAA 31 (L) 09/30/2021 0356   GFRAA 92 06/16/2020 0858    GFR Estimated Creatinine Clearance: 30.5 mL/min (A) (by C-G formula based on SCr of 2.12 mg/dL (H)). No results for input(s): LIPASE, AMYLASE in the last 72 hours. No results for input(s): CKTOTAL, CKMB, CKMBINDEX, TROPONINI in the last 72 hours. Invalid input(s): POCBNP No results for input(s): DDIMER in the last 72 hours. No results for input(s): HGBA1C in the last 72 hours. No results for input(s): CHOL, HDL, LDLCALC, TRIG, CHOLHDL, LDLDIRECT in the last 72 hours. Recent Labs    09/28/21 0348  TSH 4.189   No results for input(s): VITAMINB12, FOLATE, FERRITIN, TIBC, IRON, RETICCTPCT in the last 72 hours. Coags: Recent Labs    09/28/21 0348  INR 1.2    Microbiology: No results found for this or any previous visit (from the past 240 hour(s)).  FURTHER DISCHARGE INSTRUCTIONS:  Get Medicines reviewed and adjusted: Please take all your medications with you for your next visit with your Primary MD  Laboratory/radiological data: Please request your Primary MD to go over all hospital tests and procedure/radiological results at the follow up, please ask your Primary MD to get all Hospital records sent to his/her office.  In some cases, they will be blood work, cultures and biopsy results pending at the time of your discharge. Please request that your primary care M.D. goes through all the records of your hospital data and follows up on these results.  Also Note the following: If you experience worsening of your admission symptoms, develop shortness of breath, life threatening emergency, suicidal or homicidal thoughts you must seek medical attention immediately by calling 911 or calling your MD immediately  if symptoms less severe.  You must read complete instructions/literature along with all the possible adverse reactions/side effects for all the Medicines you take and that have been prescribed to you. Take any new Medicines after you have completely understood and accpet all the possible adverse reactions/side effects.   Do not drive when taking Pain medications or sleeping medications (Benzodaizepines)  Do not take more than prescribed Pain, Sleep and Anxiety Medications. It is not advisable to combine anxiety,sleep and pain medications without talking with your primary care practitioner  Special Instructions: If you have smoked or chewed Tobacco  in the last 2 yrs please stop smoking, stop any regular Alcohol  and or any Recreational drug use.  Wear Seat belts while driving.  Please note: You were cared for by a hospitalist during your hospital stay. Once you are discharged, your primary care physician will handle any further medical  issues. Please note that NO REFILLS for any discharge medications will be authorized once you are discharged, as it is imperative that you return to your primary care physician (or establish a relationship with a primary care physician if you do not have one) for your post hospital discharge needs so that they can reassess your need for medications and monitor your lab values.  Total Time spent coordinating discharge including counseling, education and face to face time equals greater than 30 minutes.  SignedOren Binet 09/30/2021 9:19 AM

## 2021-09-30 NOTE — Progress Notes (Signed)
Physical Therapy Treatment Patient Details Name: Darryl Diaz MRN: [id number] DOB: [birth year] Today's Date: 09/30/2021   History of Present Illness Pt is a 81 yr old who presented 09/27/21 due to generalized weakness and falls due to acute on chronic hyponatremia .  PMH: ESRD with HD MWF, afib, HTN, HLD, anemia, frequent falls, CHF, GI bleed, cardia arrest    PT Comments    Pt admitted with above diagnosis. Pt was able to ambulate with RW and incr distance however continues with poor safety awareness overall and LOB with and without challenges. Has difficulty with dorsiflexion on right LE.  Plan is for SNF.  Will continue to follow acutely.  Pt currently with functional limitations due to balance and endurance deficits. Pt will benefit from skilled PT to increase their independence and safety with mobility to allow discharge to the venue listed below.      Recommendations for follow up therapy are one component of a multi-disciplinary discharge planning process, led by the attending physician.  Recommendations may be updated based on patient status, additional functional criteria and insurance authorization.  Follow Up Recommendations  Skilled nursing-short term rehab (<3 hours/day)     Assistance Recommended at Discharge Frequent or constant Supervision/Assistance  Patient can return home with the following A lot of help with walking and/or transfers;A lot of help with bathing/dressing/bathroom;Assistance with cooking/housework;Direct supervision/assist for medications management;Direct supervision/assist for financial management;Assist for transportation   Equipment Recommendations  None recommended by PT    Recommendations for Other Services       Precautions / Restrictions Precautions Precautions: Fall Restrictions Weight Bearing Restrictions: No     Mobility  Bed Mobility Overal bed mobility: Needs Assistance Bed Mobility: Supine to Sit     Supine to sit: Min guard, HOB  elevated     General bed mobility comments: min guard for safety    Transfers Overall transfer level: Needs assistance Equipment used: Rolling walker (2 wheels) Transfers: Sit to/from Stand Sit to Stand: Min assist, Min guard           General transfer comment: Pt could not get up with the hospital socks on as they slipped each attempt. He asked for his shoes and PT obtained them from closet. The shoes smelled strongly of urine and were possibly still damp.  Pt needed assist and incr time to don shoes.  Pt can not get up from bed in lowest position with bed elevated pt min guard, needing vc for hand placement, increased self steady with LE posteriorly against bed. Pt feet better placed with shoes on.    Ambulation/Gait Ambulation/Gait assistance: Min assist, Mod assist Gait Distance (Feet): 45 Feet Assistive device: Rolling walker (2 wheels) Gait Pattern/deviations: Step-to pattern, Decreased step length - right, Decreased step length - left, Trunk flexed, Shuffle, Drifts right/left Gait velocity: slowed Gait velocity interpretation: <1.31 ft/sec, indicative of household ambulator   General Gait Details: min A for steadying with RW, vc for proximity to be able to use UE for increased support, 1x LoB posteriorly requiring modA for steadying, assist needed with turns as pt is impulsive and unsteady with turns needing cues as well.   Stairs             Wheelchair Mobility    Modified Rankin (Stroke Patients Only)       Balance Overall balance assessment: Needs assistance Sitting-balance support: Feet supported Sitting balance-Leahy Scale: Fair     Standing balance support: Bilateral upper extremity supported Standing balance-Leahy Scale: Poor  Standing balance comment: relies on UE support                            Cognition Arousal/Alertness: Awake/alert Behavior During Therapy: WFL for tasks assessed/performed Overall Cognitive Status:  Impaired/Different from baseline Area of Impairment: Safety/judgement, Problem solving                         Safety/Judgement: Decreased awareness of safety, Decreased awareness of deficits   Problem Solving: Slow processing, Requires verbal cues, Requires tactile cues General Comments: pt awareness of safety,  can not make the connection between his incontinence and his inability to safely move to clean up his bed if he has had an accident.  Pt could not stand in socks and stated his shoes were present.  Obtained his shoes that are heavily soiled with urine.        Exercises      General Comments General comments (skin integrity, edema, etc.): Pt unaware that urine was soaked in bed.  Assisted pt to bathroom and with cleaning.      Pertinent Vitals/Pain Pain Assessment Pain Assessment: No/denies pain    Home Living                          Prior Function            PT Goals (current goals can now be found in the care plan section) Acute Rehab PT Goals Patient Stated Goal: go home Progress towards PT goals: Progressing toward goals    Frequency    Min 3X/week      PT Plan Current plan remains appropriate    Co-evaluation              AM-PAC PT "6 Clicks" Mobility   Outcome Measure  Help needed turning from your back to your side while in a flat bed without using bedrails?: None Help needed moving from lying on your back to sitting on the side of a flat bed without using bedrails?: None Help needed moving to and from a bed to a chair (including a wheelchair)?: A Lot Help needed standing up from a chair using your arms (e.g., wheelchair or bedside chair)?: A Lot Help needed to walk in hospital room?: A Lot Help needed climbing 3-5 steps with a railing? : Total 6 Click Score: 15    End of Session Equipment Utilized During Treatment: Gait belt Activity Tolerance: Patient tolerated treatment well Patient left: in chair;with call  bell/phone within reach;with chair alarm set Nurse Communication: Mobility status PT Visit Diagnosis: Unsteadiness on feet (R26.81);Other abnormalities of gait and mobility (R26.89);Muscle weakness (generalized) (M62.81);History of falling (Z91.81);Repeated falls (R29.6);Difficulty in walking, not elsewhere classified (R26.2);Other symptoms and signs involving the nervous system (R29.898)     Time: 3220-2542 PT Time Calculation (min) (ACUTE ONLY): 38 min  Charges:  $Gait Training: 23-37 mins $Self Care/Home Management: 8-22                     [facility] M,PT Acute Rehab Services [phone] (pager)    Alvira Philips 09/30/2021, 11:30 AM

## 2021-09-30 NOTE — TOC Progression Note (Signed)
Transition of Care [facility]) - Progression Note    Patient Details  Name: Darryl Diaz MRN: [id number] Date of Birth: August 24, 1940  Transition of Care [facility]) CM/SW [place], LCSW Phone Number: 09/30/2021, 12:35 PM  Clinical Narrative:    CSW presented SNF bed offers to the patient with Medicare ratings list. Patient very pleasant and selected [facility]. They are able to accept patient today and will have their liaison come to the hospital to do paperwork with the patient. [place] aware of Dialysis times.    Expected Discharge Plan: [place] Barriers to Discharge: Barriers Resolved  Expected Discharge Plan and Services Expected Discharge Plan: [place] In-house Referral: Clinical Social Work   Post Acute Care Choice: [place] Living arrangements for the past 2 months: [place] Expected Discharge Date: 09/30/21                                     Social Determinants of Health (SDOH) Interventions    Readmission Risk Interventions    09/29/2021    2:21 PM 01/18/2021    9:28 AM 03/18/2020    4:18 PM  Readmission Risk Prevention Plan  Transportation Screening Complete Complete Complete  HRI or Home Care Consult   Complete  Social Work Consult for [place] Planning/Counseling   Complete  Palliative Care Screening   Not Applicable  Medication Review Press photographer) Complete Complete Complete  PCP or Specialist appointment within 3-5 days of discharge Complete Complete   HRI or Home Care Consult Complete Complete   SW Recovery Care/Counseling Consult Complete Complete   Palliative Care Screening Not Applicable Not Applicable   Skilled Nursing Facility Complete Complete

## 2021-09-30 NOTE — Progress Notes (Signed)
Pt for d/c today to [facility]. Contacted [facility] SW to advise clinic of pt's d/c today to snf and that pt will resume care tomorrow.   Melven Sartorius Renal Navigator [phone]

## 2021-09-30 NOTE — Care Management Important Message (Signed)
Important Message  Patient Details  Name: Darryl Diaz MRN: [id number] Date of Birth: 1940/09/28   Medicare Important Message Given:  Yes     Memory Argue 09/30/2021, 2:45 PM

## 2021-10-01 DIAGNOSIS — F411 Generalized anxiety disorder: Secondary | ICD-10-CM | POA: Diagnosis not present

## 2021-10-01 DIAGNOSIS — N2581 Secondary hyperparathyroidism of renal origin: Secondary | ICD-10-CM | POA: Diagnosis not present

## 2021-10-01 DIAGNOSIS — I10 Essential (primary) hypertension: Secondary | ICD-10-CM | POA: Diagnosis not present

## 2021-10-01 DIAGNOSIS — I480 Paroxysmal atrial fibrillation: Secondary | ICD-10-CM | POA: Diagnosis not present

## 2021-10-01 DIAGNOSIS — Z23 Encounter for immunization: Secondary | ICD-10-CM | POA: Diagnosis not present

## 2021-10-01 DIAGNOSIS — D509 Iron deficiency anemia, unspecified: Secondary | ICD-10-CM | POA: Diagnosis not present

## 2021-10-01 DIAGNOSIS — Z992 Dependence on renal dialysis: Secondary | ICD-10-CM | POA: Diagnosis not present

## 2021-10-01 DIAGNOSIS — K219 Gastro-esophageal reflux disease without esophagitis: Secondary | ICD-10-CM | POA: Diagnosis not present

## 2021-10-01 DIAGNOSIS — N186 End stage renal disease: Secondary | ICD-10-CM | POA: Diagnosis not present

## 2021-10-04 DIAGNOSIS — D509 Iron deficiency anemia, unspecified: Secondary | ICD-10-CM | POA: Diagnosis not present

## 2021-10-04 DIAGNOSIS — G4700 Insomnia, unspecified: Secondary | ICD-10-CM | POA: Diagnosis not present

## 2021-10-04 DIAGNOSIS — N186 End stage renal disease: Secondary | ICD-10-CM | POA: Diagnosis not present

## 2021-10-04 DIAGNOSIS — Z23 Encounter for immunization: Secondary | ICD-10-CM | POA: Diagnosis not present

## 2021-10-04 DIAGNOSIS — F411 Generalized anxiety disorder: Secondary | ICD-10-CM | POA: Diagnosis not present

## 2021-10-04 DIAGNOSIS — Z992 Dependence on renal dialysis: Secondary | ICD-10-CM | POA: Diagnosis not present

## 2021-10-04 DIAGNOSIS — I480 Paroxysmal atrial fibrillation: Secondary | ICD-10-CM | POA: Diagnosis not present

## 2021-10-04 DIAGNOSIS — N2581 Secondary hyperparathyroidism of renal origin: Secondary | ICD-10-CM | POA: Diagnosis not present

## 2021-10-06 ENCOUNTER — Other Ambulatory Visit: Payer: Self-pay | Admitting: *Deleted

## 2021-10-06 DIAGNOSIS — Z992 Dependence on renal dialysis: Secondary | ICD-10-CM | POA: Diagnosis not present

## 2021-10-06 DIAGNOSIS — N2581 Secondary hyperparathyroidism of renal origin: Secondary | ICD-10-CM | POA: Diagnosis not present

## 2021-10-06 DIAGNOSIS — Z23 Encounter for immunization: Secondary | ICD-10-CM | POA: Diagnosis not present

## 2021-10-06 DIAGNOSIS — D509 Iron deficiency anemia, unspecified: Secondary | ICD-10-CM | POA: Diagnosis not present

## 2021-10-06 DIAGNOSIS — N186 End stage renal disease: Secondary | ICD-10-CM | POA: Diagnosis not present

## 2021-10-06 NOTE — Patient Outreach (Signed)
Per [place] eligible member currently resides in [facility].  Screened for potential [facility] Care Management as a benefit of member's insurance plan. Darryl Diaz was referred to [place] Management recently.  Darryl Diaz admitted to [facility] on 09/30/21 after hospitalization.  Facility site visit to [facility] skilled nursing facility. Met with Darryl Diaz, SNF SW concerning member's transition plan and THN needs. Anticipated transition plan is to return home.   Went to room to speak with Darryl Diaz. However, he was off the unit at dialysis.  Will follow up at later time. Will continue to follow.    [name], MSN, RN,BSN [place] Acute Care Coordinator [phone] [facility]) [phone]  (Toll free office)

## 2021-10-07 DIAGNOSIS — N186 End stage renal disease: Secondary | ICD-10-CM | POA: Diagnosis not present

## 2021-10-07 DIAGNOSIS — K219 Gastro-esophageal reflux disease without esophagitis: Secondary | ICD-10-CM | POA: Diagnosis not present

## 2021-10-07 DIAGNOSIS — F411 Generalized anxiety disorder: Secondary | ICD-10-CM | POA: Diagnosis not present

## 2021-10-07 DIAGNOSIS — I129 Hypertensive chronic kidney disease with stage 1 through stage 4 chronic kidney disease, or unspecified chronic kidney disease: Secondary | ICD-10-CM | POA: Diagnosis not present

## 2021-10-07 DIAGNOSIS — I480 Paroxysmal atrial fibrillation: Secondary | ICD-10-CM | POA: Diagnosis not present

## 2021-10-07 DIAGNOSIS — Z992 Dependence on renal dialysis: Secondary | ICD-10-CM | POA: Diagnosis not present

## 2021-10-07 DIAGNOSIS — G4700 Insomnia, unspecified: Secondary | ICD-10-CM | POA: Diagnosis not present

## 2021-10-08 DIAGNOSIS — N2581 Secondary hyperparathyroidism of renal origin: Secondary | ICD-10-CM | POA: Diagnosis not present

## 2021-10-08 DIAGNOSIS — D631 Anemia in chronic kidney disease: Secondary | ICD-10-CM | POA: Diagnosis not present

## 2021-10-08 DIAGNOSIS — D509 Iron deficiency anemia, unspecified: Secondary | ICD-10-CM | POA: Diagnosis not present

## 2021-10-08 DIAGNOSIS — N186 End stage renal disease: Secondary | ICD-10-CM | POA: Diagnosis not present

## 2021-10-08 DIAGNOSIS — Z992 Dependence on renal dialysis: Secondary | ICD-10-CM | POA: Diagnosis not present

## 2021-10-11 DIAGNOSIS — N2581 Secondary hyperparathyroidism of renal origin: Secondary | ICD-10-CM | POA: Diagnosis not present

## 2021-10-11 DIAGNOSIS — Z992 Dependence on renal dialysis: Secondary | ICD-10-CM | POA: Diagnosis not present

## 2021-10-11 DIAGNOSIS — N186 End stage renal disease: Secondary | ICD-10-CM | POA: Diagnosis not present

## 2021-10-11 DIAGNOSIS — D509 Iron deficiency anemia, unspecified: Secondary | ICD-10-CM | POA: Diagnosis not present

## 2021-10-11 DIAGNOSIS — D631 Anemia in chronic kidney disease: Secondary | ICD-10-CM | POA: Diagnosis not present

## 2021-10-12 ENCOUNTER — Other Ambulatory Visit: Payer: Self-pay | Admitting: *Deleted

## 2021-10-12 NOTE — Patient Outreach (Signed)
[facility]) Care Management  10/12/2021  ARSHDEEP BOLGER [birth year] [id number]   Telephone Outreach-Unsuccessful #4  RN has made several outreach attempts however unsuccessful and unable to leave a message at any of the Epic numbers listed. No response to outreach letter.  Will send closure letter to there provider and close case via [place] services at this time.  [name], RN Care Management Coordinator [place] Office [phone]

## 2021-10-13 DIAGNOSIS — D631 Anemia in chronic kidney disease: Secondary | ICD-10-CM | POA: Diagnosis not present

## 2021-10-13 DIAGNOSIS — D509 Iron deficiency anemia, unspecified: Secondary | ICD-10-CM | POA: Diagnosis not present

## 2021-10-13 DIAGNOSIS — N2581 Secondary hyperparathyroidism of renal origin: Secondary | ICD-10-CM | POA: Diagnosis not present

## 2021-10-13 DIAGNOSIS — N186 End stage renal disease: Secondary | ICD-10-CM | POA: Diagnosis not present

## 2021-10-13 DIAGNOSIS — Z992 Dependence on renal dialysis: Secondary | ICD-10-CM | POA: Diagnosis not present

## 2021-10-13 DIAGNOSIS — I10 Essential (primary) hypertension: Secondary | ICD-10-CM | POA: Diagnosis not present

## 2021-10-13 DIAGNOSIS — I480 Paroxysmal atrial fibrillation: Secondary | ICD-10-CM | POA: Diagnosis not present

## 2021-10-13 DIAGNOSIS — M6281 Muscle weakness (generalized): Secondary | ICD-10-CM | POA: Diagnosis not present

## 2021-10-14 ENCOUNTER — Other Ambulatory Visit: Payer: Self-pay | Admitting: *Deleted

## 2021-10-14 NOTE — Patient Outreach (Signed)
THN Post- Acute Care Coordinator follow up. Per [place] eligible member currently resides in [facility].  Screening for potential [facility] Care Management services as a benefit of member's insurance plan.  Facility site visit to [facility] to speak with [name] today (Thursday). However, he was off the unit. Possibly in therapy. [name] goes to HD on Monday, Wednesday, and Friday.   Will continue to follow and plan follow up on non HD day.    Darryl Rolling, MSN, RN,BSN [place] Acute Care Coordinator [phone] [facility]) [phone]  (Toll free office)

## 2021-10-15 DIAGNOSIS — D509 Iron deficiency anemia, unspecified: Secondary | ICD-10-CM | POA: Diagnosis not present

## 2021-10-15 DIAGNOSIS — N2581 Secondary hyperparathyroidism of renal origin: Secondary | ICD-10-CM | POA: Diagnosis not present

## 2021-10-15 DIAGNOSIS — D631 Anemia in chronic kidney disease: Secondary | ICD-10-CM | POA: Diagnosis not present

## 2021-10-15 DIAGNOSIS — Z992 Dependence on renal dialysis: Secondary | ICD-10-CM | POA: Diagnosis not present

## 2021-10-15 DIAGNOSIS — N186 End stage renal disease: Secondary | ICD-10-CM | POA: Diagnosis not present

## 2021-10-18 ENCOUNTER — Other Ambulatory Visit: Payer: Self-pay | Admitting: *Deleted

## 2021-10-18 DIAGNOSIS — D509 Iron deficiency anemia, unspecified: Secondary | ICD-10-CM | POA: Diagnosis not present

## 2021-10-18 DIAGNOSIS — Z992 Dependence on renal dialysis: Secondary | ICD-10-CM | POA: Diagnosis not present

## 2021-10-18 DIAGNOSIS — N2581 Secondary hyperparathyroidism of renal origin: Secondary | ICD-10-CM | POA: Diagnosis not present

## 2021-10-18 DIAGNOSIS — D631 Anemia in chronic kidney disease: Secondary | ICD-10-CM | POA: Diagnosis not present

## 2021-10-18 DIAGNOSIS — N186 End stage renal disease: Secondary | ICD-10-CM | POA: Diagnosis not present

## 2021-10-18 NOTE — Patient Outreach (Signed)
THN Post- Acute Care Coordinator follow up. Member screened for potential [facility] services as a benefit of Mr. [facility] insurance plan.   Verified in [facility] that Mr. Brickle transitioned from [facility] to home on 10/15/21.  Previous attempts made to meet with Mr. Cudworth while in [place]. However, he was off the unit during visits.   Will re-refer to [place] Management RNCM.    [name], MSN, RN,BSN [place] Acute Care Coordinator [phone] [facility]) [phone]  (Toll free office)

## 2021-10-18 NOTE — Patient Outreach (Signed)
[facility]) Care Management  10/18/2021  OLUWADAMILARE TOBLER [birth year] [id number]   Received hospital referral for [facility] care management from [facility], RN. Re-assigned patient to [name], RN care coordinator for follow up.   [place] Management Assistant [phone]

## 2021-10-20 DIAGNOSIS — N2581 Secondary hyperparathyroidism of renal origin: Secondary | ICD-10-CM | POA: Diagnosis not present

## 2021-10-20 DIAGNOSIS — D631 Anemia in chronic kidney disease: Secondary | ICD-10-CM | POA: Diagnosis not present

## 2021-10-20 DIAGNOSIS — Z992 Dependence on renal dialysis: Secondary | ICD-10-CM | POA: Diagnosis not present

## 2021-10-20 DIAGNOSIS — N186 End stage renal disease: Secondary | ICD-10-CM | POA: Diagnosis not present

## 2021-10-20 DIAGNOSIS — D509 Iron deficiency anemia, unspecified: Secondary | ICD-10-CM | POA: Diagnosis not present

## 2021-10-22 DIAGNOSIS — Z992 Dependence on renal dialysis: Secondary | ICD-10-CM | POA: Diagnosis not present

## 2021-10-22 DIAGNOSIS — N2581 Secondary hyperparathyroidism of renal origin: Secondary | ICD-10-CM | POA: Diagnosis not present

## 2021-10-22 DIAGNOSIS — D631 Anemia in chronic kidney disease: Secondary | ICD-10-CM | POA: Diagnosis not present

## 2021-10-22 DIAGNOSIS — D509 Iron deficiency anemia, unspecified: Secondary | ICD-10-CM | POA: Diagnosis not present

## 2021-10-22 DIAGNOSIS — N186 End stage renal disease: Secondary | ICD-10-CM | POA: Diagnosis not present

## 2021-10-25 DIAGNOSIS — N2581 Secondary hyperparathyroidism of renal origin: Secondary | ICD-10-CM | POA: Diagnosis not present

## 2021-10-25 DIAGNOSIS — Z992 Dependence on renal dialysis: Secondary | ICD-10-CM | POA: Diagnosis not present

## 2021-10-25 DIAGNOSIS — D631 Anemia in chronic kidney disease: Secondary | ICD-10-CM | POA: Diagnosis not present

## 2021-10-25 DIAGNOSIS — D509 Iron deficiency anemia, unspecified: Secondary | ICD-10-CM | POA: Diagnosis not present

## 2021-10-25 DIAGNOSIS — N186 End stage renal disease: Secondary | ICD-10-CM | POA: Diagnosis not present

## 2021-10-27 ENCOUNTER — Other Ambulatory Visit: Payer: Self-pay | Admitting: *Deleted

## 2021-10-27 DIAGNOSIS — N186 End stage renal disease: Secondary | ICD-10-CM | POA: Diagnosis not present

## 2021-10-27 DIAGNOSIS — N2581 Secondary hyperparathyroidism of renal origin: Secondary | ICD-10-CM | POA: Diagnosis not present

## 2021-10-27 DIAGNOSIS — Z992 Dependence on renal dialysis: Secondary | ICD-10-CM | POA: Diagnosis not present

## 2021-10-27 DIAGNOSIS — D509 Iron deficiency anemia, unspecified: Secondary | ICD-10-CM | POA: Diagnosis not present

## 2021-10-27 DIAGNOSIS — D631 Anemia in chronic kidney disease: Secondary | ICD-10-CM | POA: Diagnosis not present

## 2021-10-27 NOTE — Patient Outreach (Signed)
[facility]) Care Management  10/27/2021  HENDRICK PAVICH Jun 03, 1940 [id number]   THN-Transition of care-Unsuccessful #1  RN attempted outreach call today however unsuccessful to all available Epic numbers listed. RN unable to leave a HIPAA voice message at this time.   RN will sent outreach letter and attempt another outreach over the next week for [facility] services.  [name], RN Care Management Coordinator [place] Office [phone]

## 2021-10-29 DIAGNOSIS — D509 Iron deficiency anemia, unspecified: Secondary | ICD-10-CM | POA: Diagnosis not present

## 2021-10-29 DIAGNOSIS — N2581 Secondary hyperparathyroidism of renal origin: Secondary | ICD-10-CM | POA: Diagnosis not present

## 2021-10-29 DIAGNOSIS — Z992 Dependence on renal dialysis: Secondary | ICD-10-CM | POA: Diagnosis not present

## 2021-10-29 DIAGNOSIS — D631 Anemia in chronic kidney disease: Secondary | ICD-10-CM | POA: Diagnosis not present

## 2021-10-29 DIAGNOSIS — N186 End stage renal disease: Secondary | ICD-10-CM | POA: Diagnosis not present

## 2021-11-01 DIAGNOSIS — D631 Anemia in chronic kidney disease: Secondary | ICD-10-CM | POA: Diagnosis not present

## 2021-11-01 DIAGNOSIS — N2581 Secondary hyperparathyroidism of renal origin: Secondary | ICD-10-CM | POA: Diagnosis not present

## 2021-11-01 DIAGNOSIS — N186 End stage renal disease: Secondary | ICD-10-CM | POA: Diagnosis not present

## 2021-11-01 DIAGNOSIS — D509 Iron deficiency anemia, unspecified: Secondary | ICD-10-CM | POA: Diagnosis not present

## 2021-11-01 DIAGNOSIS — Z992 Dependence on renal dialysis: Secondary | ICD-10-CM | POA: Diagnosis not present

## 2021-11-02 ENCOUNTER — Ambulatory Visit (INDEPENDENT_AMBULATORY_CARE_PROVIDER_SITE_OTHER): Payer: Medicare Other | Admitting: Family

## 2021-11-02 ENCOUNTER — Ambulatory Visit (INDEPENDENT_AMBULATORY_CARE_PROVIDER_SITE_OTHER): Payer: Medicare Other

## 2021-11-02 ENCOUNTER — Other Ambulatory Visit: Payer: Self-pay | Admitting: *Deleted

## 2021-11-02 DIAGNOSIS — L97522 Non-pressure chronic ulcer of other part of left foot with fat layer exposed: Secondary | ICD-10-CM | POA: Diagnosis not present

## 2021-11-02 DIAGNOSIS — M79672 Pain in left foot: Secondary | ICD-10-CM

## 2021-11-02 NOTE — Progress Notes (Signed)
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  He has been seen by his primary care at [place] for the same they were concerned about redness and swelling to the left foot he does have ulcer to the fourth toe, the tip.  He denies any pain he was placed on doxycycline for cellulitis by Dr. Truman Hayward earlier this week.  Denies fevers or chills  Assessment & Plan: Visit Diagnoses:  1. Pain in left foot   2. Ulcer of toe of left foot, with fat layer exposed ([place])     Plan: Concern for possible underlying osteomyelitis; radiographs unequivocal.  Continue doxycycline.  Daily Dial soap cleansing of the foot and toe keep the toe clean and dry may be used Neosporin or mupirocin.  Plan to follow-up in the office in 2 weeks  Follow-Up Instructions: No follow-ups on file.   Ortho Exam  Patient is alert, oriented, no adenopathy, well-dressed, normal affect, normal respiratory effort. On examination of the left foot there is erythema and mild edema over the dorsum of his foot there is dermatitis.  He does have swelling of the fourth toe with a Wagner grade 1 ulcer distally this is 3 mm in diameter does not probe to bone.  There is no active drainage.  Palpable dorsalis pedis pulse.  Imaging: No results found. No images are attached to the encounter.  Labs: Lab Results  Component Value Date   HGBA1C 4.4 (L) 09/07/2020   HGBA1C 4.7 (L) 02/28/2019   ESRSEDRATE 25 (H) 06/07/2021   CRP 3.2 (H) 06/10/2021   CRP 4.1 (H) 06/07/2021   LABURIC 5.5 09/28/2021   REPTSTATUS 06/13/2021 FINAL  06/08/2021   GRAMSTAIN  01/20/2021    RARE WBC PRESENT,BOTH PMN AND MONONUCLEAR RARE GRAM POSITIVE COCCI IN PAIRS Performed at [facility], [place] [address] [place], [place] [address]    CULT  06/08/2021    NO GROWTH 5 DAYS Performed at [facility] [address] [place], [place] [address]    LABORGA STAPHYLOCOCCUS AUREUS 01/20/2021     Lab Results  Component Value Date   ALBUMIN 2.8 (L) 09/30/2021   ALBUMIN 2.8 (L) 09/29/2021   ALBUMIN 3.1 (L) 09/28/2021    Lab Results  Component Value Date   MG 1.7 09/30/2021   MG 1.7 09/29/2021   MG 1.8 09/28/2021   No results found for: "VD25OH"  No results found for: "PREALBUMIN"    Latest Ref Rng & Units 09/29/2021   12:53 AM 09/28/2021    9:12 AM 09/28/2021    2:59 AM  CBC EXTENDED  WBC 4.0 - 10.5 K/uL 2.0  3.8  3.8   RBC 4.22 - 5.81 MIL/uL 2.71  3.23  2.95   Hemoglobin 13.0 - 17.0 g/dL 9.0  10.8  9.8   HCT 39.0 - 52.0 % 25.5  29.8  26.5   Platelets 150 - 400  K/uL 94  122  127   NEUT# 1.7 - 7.7 K/uL  2.7  2.6   Lymph# 0.7 - 4.0 K/uL  0.6  0.7      There is no height or weight on file to calculate BMI.  Orders:  Orders Placed This Encounter  Procedures   XR Toe 4th Left   No orders of the defined types were placed in this encounter.    Procedures: No procedures performed  Clinical Data: No additional findings.  ROS:  All other systems negative, except as noted in the HPI. Review of Systems  Objective: Vital Signs: There were no vitals taken for this visit.  Specialty Comments:  No specialty comments available.  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[place]) 05/25/2021   Non-pressure chronic ulcer of other part of left foot with unspecified severity ([place]) 05/25/2021   Osteomyelitis of left  foot ([place]) 05/25/2021   Osteomyelitis of great toe of left foot ([place])    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[place]) 04/13/2021   Cellulitis of right foot 01/17/2021   Normocytic anemia 01/17/2021   Acute lower UTI 01/17/2021   Acute GI bleeding 01/06/2021   Symptomatic anemia 12/02/2020   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   Orthostatic hypotension 11/23/2020   Acute renal insufficiency 10/23/2020   Alcohol abuse 10/23/2020   Hypokalemia 10/23/2020   Hypomagnesemia 10/23/2020   Low back pain at multiple sites 10/23/2020   Malnutrition of mild degree Altamease Oiler: 75% to less than 90% of standard weight) ([place]) 10/23/2020   Open wound of scalp without complication 18/56/3149   Oral candidiasis 10/23/2020   Cellulitis of left foot    Cellulitis of toe of left foot 10/13/2020   Skin ulcer of left great toe ([place]) 10/13/2020   CKD (chronic kidney disease) stage 4, 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/13/2020   Chronic heart failure with preserved ejection fraction (HFpEF) ([place]) 10/13/2020   Transient weakness of left lower extremity 09/07/2020   Heme positive stool 05/14/2020   Abnormality of gait 04/27/2020   Salmonella food poisoning 70/26/3785   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[place]) 03/17/2020   Yeast infection 03/16/2020   Diarrhea 03/16/2020   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 (HCC)    Allergies    Anemia, chronic disease    History of hypertension    Debility    Syncope and collapse 02/22/2020   AKI (acute kidney injury) ([place]) 02/10/2020   Unspecified protein-calorie malnutrition ([place]) 02/10/2020   Weakness 02/06/2020   History of repair of hip joint 10/15/2019   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   Pain in right foot 06/27/2019   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   Bradycardia 04/20/2019   Gastrointestinal hemorrhage 02/25/2019   Fall at home, initial encounter 02/25/2019   Bilateral lower extremity edema  02/25/2019   Hyponatremia 02/25/2019   Fall 02/25/2019   Benign prostatic hyperplasia with lower urinary tract symptoms 12/19/2018   Iron deficiency anemia due to chronic blood loss 08/11/2018   Anemia 08/11/2018   Hardening of the aorta (main artery of the heart) ([place]) 08/11/2018   Benign neoplasm of colon 06/18/2018   History of adenomatous polyp of colon 08/10/2017   Impaired fasting glucose 06/10/2016   Encounter for general adult medical examination without abnormal findings 06/03/2016  Posterior dislocation of hip, closed ([place]) 04/30/2016   Hip fracture ([place]) 03/30/2016   Hypertension 03/30/2016   Hyperlipidemia 03/30/2016   Osteoarthritis 03/30/2016   Depressive disorder 03/30/2016   Rhabdomyolysis 03/30/2016   Leukocytosis 03/30/2016   Pressure ulcer 03/30/2016   Fracture of hip ([place]) 03/30/2016   Gastroesophageal reflux disease 12/17/2015   Paroxysmal atrial fibrillation ([place]) 12/17/2015   Primary hypertension 12/17/2015   Long term (current) use of anticoagulants 12/17/2015   Moderate major depression, single episode ([place]) 12/17/2015   Pure hypercholesterolemia 12/17/2015   Seasonal allergic rhinitis 12/17/2015   CAD (coronary artery disease) 08/21/2015   Past Medical History:  Diagnosis Date   Alcohol abuse 28/76/8115   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[place]) 03/17/2020   Alcoholism ([place])    in remission following wife's death   Anemia    Anxiety    Ascending aorta dilation (HCC)    Atrial fibrillation (HCC)    Cardiac arrest ([place]) 04/20/2019   12 min CPR with epi   Chronic diastolic CHF (congestive heart failure) (HCC)    Dysrhythmia    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[place])    Mon Wed Fri   Fatty liver    Gastritis    GERD (gastroesophageal reflux disease)    H/O seasonal allergies    History of blood transfusion    History of GI bleed    Hx of adenomatous colonic polyps 08/10/2017   Hyperlipidemia    Hypertension    Insomnia    Major  depressive disorder    following wife's death   Mallory-Weiss tear    OA (osteoarthritis)    hands   OSA (obstructive sleep apnea)    No longer uses CPAP   Persistent atrial fibrillation with rapid ventricular response ([place]) 02/05/2020   Recurrent syncope    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([place])     Family History  Problem Relation Age of Onset   Heart disease Mother 62   Hypertension Mother    Arthritis Mother    Heart failure Father 57   Stroke Maternal Aunt    Heart failure Sister    Colon cancer Neg Hx    Esophageal cancer Neg Hx    Stomach cancer Neg Hx    Rectal cancer Neg Hx     Past Surgical History:  Procedure Laterality Date   AMPUTATION Left 05/19/2021   Procedure: LEFT GREAT TOE AMPUTATION;  Surgeon: Newt Minion, MD;  Location: [facility];  Service: Orthopedics;  Laterality: Left;   ANKLE SURGERY Left 2014   had rods put in    AV FISTULA PLACEMENT Left 04/21/2021   Procedure: LEFT ARM ARTERIOVENOUS (AV) FISTULA CREATION - Left brachiocephalic fistula;  Surgeon: Marty Heck, MD;  Location: [facility];  Service: Vascular;  Laterality: Left;   BIOPSY  02/28/2019   Procedure: BIOPSY;  Surgeon: Milus Banister, MD;  Location: [facility];  Service: Endoscopy;;   CARDIOVERSION  02/06/2020   CARDIOVERSION N/A 02/06/2020   Procedure: CARDIOVERSION;  Surgeon: Werner Lean, MD;  Location: [facility] ENDOSCOPY;  Service: Cardiovascular;  Laterality: N/A;   CARDIOVERSION N/A 02/24/2020   Procedure: CARDIOVERSION;  Surgeon: Werner Lean, MD;  Location: [facility];  Service: Cardiovascular;  Laterality: N/A;   COLONOSCOPY     [place]   COLONOSCOPY WITH PROPOFOL N/A 01/08/2021   Procedure: COLONOSCOPY WITH PROPOFOL;  Surgeon: Milus Banister, MD;  Location[facility] ENDOSCOPY;  Service: Endoscopy;  Laterality: N/A;   ESOPHAGOGASTRODUODENOSCOPY (EGD) WITH PROPOFOL N/A 02/28/2019  Procedure: ESOPHAGOGASTRODUODENOSCOPY (EGD) WITH PROPOFOL;  Surgeon: Milus Banister,  MD;  Location: [facility] ENDOSCOPY;  Service: Endoscopy;  Laterality: N/A;   FRACTURE SURGERY     HIP ARTHROPLASTY Left 04/01/2016   Procedure: ARTHROPLASTY BIPOLAR HIP (HEMIARTHROPLASTY);  Surgeon: Paralee Cancel, MD;  Location: [place];  Service: Orthopedics;  Laterality: Left;   HIP CLOSED REDUCTION Left 04/30/2016   Procedure: CLOSED REDUCTION HIP;  Surgeon: Wylene Simmer, MD;  Location: [facility];  Service: Orthopedics;  Laterality: Left;   HOT HEMOSTASIS N/A 01/08/2021   Procedure: HOT HEMOSTASIS (ARGON PLASMA COAGULATION/BICAP);  Surgeon: Milus Banister, MD;  Location[facility] ENDOSCOPY;  Service: Endoscopy;  Laterality: N/A;   IR RADIOLOGIST EVAL & MGMT  12/31/2020   JOINT REPLACEMENT     TONSILLECTOMY     TOTAL HIP ARTHROPLASTY Right 10/15/2019   Procedure: TOTAL HIP ARTHROPLASTY ANTERIOR APPROACH;  Surgeon: Paralee Cancel, MD;  Location: [facility];  Service: Orthopedics;  Laterality: Right;  70 mins   VASCULAR SURGERY     Social History   Occupational History   Occupation: retired  Tobacco Use   Smoking status: Former    Packs/day: 1.00    Years: 20.00    Total pack years: 20.00    Types: Cigarettes   Smokeless tobacco: Never   Tobacco comments:       quit 20 years  smoked on and off for 20 years  Vaping Use   Vaping Use: Never used  Substance and Sexual Activity   Alcohol use: Not Currently    Alcohol/week: 3.0 standard drinks of alcohol    Types: 3 Glasses of wine per week    Comment: h/o heavy use   Drug use: Never   Sexual activity: Not Currently

## 2021-11-03 DIAGNOSIS — Z992 Dependence on renal dialysis: Secondary | ICD-10-CM | POA: Diagnosis not present

## 2021-11-03 DIAGNOSIS — D509 Iron deficiency anemia, unspecified: Secondary | ICD-10-CM | POA: Diagnosis not present

## 2021-11-03 DIAGNOSIS — N2581 Secondary hyperparathyroidism of renal origin: Secondary | ICD-10-CM | POA: Diagnosis not present

## 2021-11-03 DIAGNOSIS — N186 End stage renal disease: Secondary | ICD-10-CM | POA: Diagnosis not present

## 2021-11-03 DIAGNOSIS — D631 Anemia in chronic kidney disease: Secondary | ICD-10-CM | POA: Diagnosis not present

## 2021-11-05 ENCOUNTER — Encounter: Payer: Self-pay | Admitting: Family

## 2021-11-05 ENCOUNTER — Other Ambulatory Visit: Payer: Self-pay | Admitting: *Deleted

## 2021-11-05 DIAGNOSIS — D631 Anemia in chronic kidney disease: Secondary | ICD-10-CM | POA: Diagnosis not present

## 2021-11-05 DIAGNOSIS — Z992 Dependence on renal dialysis: Secondary | ICD-10-CM | POA: Diagnosis not present

## 2021-11-05 DIAGNOSIS — N186 End stage renal disease: Secondary | ICD-10-CM | POA: Diagnosis not present

## 2021-11-05 DIAGNOSIS — D509 Iron deficiency anemia, unspecified: Secondary | ICD-10-CM | POA: Diagnosis not present

## 2021-11-05 DIAGNOSIS — N2581 Secondary hyperparathyroidism of renal origin: Secondary | ICD-10-CM | POA: Diagnosis not present

## 2021-11-05 NOTE — Patient Outreach (Signed)
[place] [facility]) Care Management  11/05/2021  Darryl Diaz [birth year] [id number]   Transition of care-Unsuccessful #3  RN attempted another outreach call to the pt's sister [name] however unsuccessful and unable to leave a message.  RN will close this case and notify the provider.  [name], RN Care Management Coordinator [place] Office [phone]

## 2021-11-06 DIAGNOSIS — Z992 Dependence on renal dialysis: Secondary | ICD-10-CM | POA: Diagnosis not present

## 2021-11-06 DIAGNOSIS — N186 End stage renal disease: Secondary | ICD-10-CM | POA: Diagnosis not present

## 2021-11-06 DIAGNOSIS — I129 Hypertensive chronic kidney disease with stage 1 through stage 4 chronic kidney disease, or unspecified chronic kidney disease: Secondary | ICD-10-CM | POA: Diagnosis not present

## 2021-11-08 ENCOUNTER — Encounter (HOSPITAL_COMMUNITY): Payer: Self-pay | Admitting: Pharmacy Technician

## 2021-11-08 ENCOUNTER — Emergency Department (HOSPITAL_COMMUNITY)
Admission: EM | Admit: 2021-11-08 | Discharge: 2021-11-08 | Disposition: A | Discharge status: Home / Self Care | Payer: Medicare Other | Attending: Emergency Medicine | Admitting: Emergency Medicine

## 2021-11-08 ENCOUNTER — Other Ambulatory Visit: Payer: Self-pay

## 2021-11-08 DIAGNOSIS — W010XXA Fall on same level from slipping, tripping and stumbling without subsequent striking against object, initial encounter: Secondary | ICD-10-CM | POA: Diagnosis not present

## 2021-11-08 DIAGNOSIS — Y92015 Private garage of single-family (private) house as the place of occurrence of the external cause: Secondary | ICD-10-CM | POA: Diagnosis not present

## 2021-11-08 DIAGNOSIS — R109 Unspecified abdominal pain: Secondary | ICD-10-CM | POA: Diagnosis not present

## 2021-11-08 DIAGNOSIS — Z992 Dependence on renal dialysis: Secondary | ICD-10-CM | POA: Diagnosis not present

## 2021-11-08 DIAGNOSIS — W19XXXA Unspecified fall, initial encounter: Secondary | ICD-10-CM | POA: Diagnosis not present

## 2021-11-08 DIAGNOSIS — I959 Hypotension, unspecified: Secondary | ICD-10-CM | POA: Diagnosis not present

## 2021-11-08 DIAGNOSIS — Z79899 Other long term (current) drug therapy: Secondary | ICD-10-CM | POA: Diagnosis not present

## 2021-11-08 DIAGNOSIS — E162 Hypoglycemia, unspecified: Secondary | ICD-10-CM | POA: Insufficient documentation

## 2021-11-08 DIAGNOSIS — M549 Dorsalgia, unspecified: Secondary | ICD-10-CM | POA: Diagnosis not present

## 2021-11-08 LAB — COMPREHENSIVE METABOLIC PANEL
ALT: 23 U/L (ref 0–44)
AST: 43 U/L — ABNORMAL HIGH (ref 15–41)
Albumin: 3.2 g/dL — ABNORMAL LOW (ref 3.5–5.0)
Alkaline Phosphatase: 142 U/L — ABNORMAL HIGH (ref 38–126)
Anion gap: 20 — ABNORMAL HIGH (ref 5–15)
BUN: 26 mg/dL — ABNORMAL HIGH (ref 8–23)
CO2: 19 mmol/L — ABNORMAL LOW (ref 22–32)
Calcium: 8.3 mg/dL — ABNORMAL LOW (ref 8.9–10.3)
Chloride: 89 mmol/L — ABNORMAL LOW (ref 98–111)
Creatinine, Ser: 3.15 mg/dL — ABNORMAL HIGH (ref 0.61–1.24)
GFR, Estimated: 19 mL/min — ABNORMAL LOW (ref >60)
Glucose, Bld: 54 mg/dL — ABNORMAL LOW (ref 70–99)
Potassium: 3.7 mmol/L (ref 3.5–5.1)
Sodium: 128 mmol/L — ABNORMAL LOW (ref 135–145)
Total Bilirubin: 1.6 mg/dL — ABNORMAL HIGH (ref 0.3–1.2)
Total Protein: 5.7 g/dL — ABNORMAL LOW (ref 6.5–8.1)

## 2021-11-08 LAB — CBG MONITORING, ED
Glucose-Capillary: 43 mg/dL — CL (ref 70–99)
Glucose-Capillary: 115 mg/dL — ABNORMAL HIGH (ref 70–99)

## 2021-11-08 LAB — CBC
HCT: 29.6 % — ABNORMAL LOW (ref 39.0–52.0)
Hemoglobin: 10.4 g/dL — ABNORMAL LOW (ref 13.0–17.0)
MCH: 33.4 pg (ref 26.0–34.0)
MCHC: 35.1 g/dL (ref 30.0–36.0)
MCV: 95.2 fL (ref 80.0–100.0)
Platelets: 152 10*3/uL (ref 150–400)
RBC: 3.11 MIL/uL — ABNORMAL LOW (ref 4.22–5.81)
RDW: 15.3 % (ref 11.5–15.5)
WBC: 4.6 10*3/uL (ref 4.0–10.5)
nRBC: 0 % (ref 0.0–0.2)

## 2021-11-08 LAB — LIPASE, BLOOD: Lipase: 23 U/L (ref 11–51)

## 2021-11-08 NOTE — ED Triage Notes (Signed)
Pt bib ptar after all this morning. Transport came to take pt to dialysis and  heard him in his garage. Pt complains of left flank pain. Last dialysis tx on Friday. Pt unsteady on feet. Endorses dizziness when standing.

## 2021-11-08 NOTE — ED Notes (Signed)
Renal pt no urine

## 2021-11-08 NOTE — ED Provider Notes (Signed)
[facility] EMERGENCY DEPARTMENT Provider Note   CSN: [id number] Arrival date & time: 11/08/21  1132     History {Add pertinent medical, surgical, social history, OB history to HPI:1} Chief Complaint  Patient presents with   Fall   Flank Pain    Darryl Diaz is a 81 y.o. male.  Patient states he was waiting to be taken to dialysis and he slipped and fell in the garage.  Patient did not hit his head and has no complaints.   Fall  Flank Pain       Home Medications Prior to Admission medications   Medication Sig Start Date End Date Taking? Authorizing Provider  acetaminophen (TYLENOL) 325 MG tablet Take 2 tablets (650 mg total) by mouth every 4 (four) hours as needed for headache or mild pain. Patient taking differently: Take 325-650 mg by mouth every 4 (four) hours as needed for headache. 03/10/20   Angiulli, Lavon Paganini, PA-C  albuterol (PROVENTIL) (2.5 MG/3ML) 0.083% nebulizer solution Take 3 mLs (2.5 mg total) by nebulization every 6 (six) hours as needed for wheezing or shortness of breath. Patient not taking: Reported on 09/29/2021 06/14/21   Bonnielee Haff, MD  amiodarone (PACERONE) 200 MG tablet TAKE 1 TABLET BY MOUTH ONCE DAILY Patient taking differently: Take 200 mg by mouth every morning. 05/17/21   Richardson Dopp T, PA-C  atorvastatin (LIPITOR) 20 MG tablet Take 1 tablet (20 mg total) by mouth daily. Patient taking differently: Take 20 mg by mouth every morning. 03/10/20   Angiulli, Lavon Paganini, PA-C  carvedilol (COREG) 6.25 MG tablet Take 1 tablet (6.25 mg total) by mouth 2 (two) times daily. 02/11/21   Sherren Mocha, MD  cetirizine (ZYRTEC) 10 MG tablet Take 10 mg by mouth daily. Patient not taking: Reported on 06/08/2021    [provider]  escitalopram (LEXAPRO) 10 MG tablet Take 1 tablet (10 mg total) by mouth daily. Patient taking differently: Take 10 mg by mouth every morning. 03/10/20   Angiulli, Lavon Paganini, PA-C  ferrous sulfate 324 MG TBEC  Take 324 mg by mouth every morning.    [provider]  FOLIC ACID PO Take 1 tablet by mouth every morning.    [provider]  Magnesium Oxide 400 MG CAPS Take 1 capsule (400 mg total) by mouth daily. 10/17/20   Eugenie Filler, MD  Multiple Vitamins-Minerals (CENTRUM SILVER 50+MEN) TABS Take 1 tablet by mouth every other day.    [provider]  nitroGLYCERIN (NITROSTAT) 0.4 MG SL tablet Place 1 tablet (0.4 mg total) under the tongue every 5 (five) minutes x 3 doses as needed for chest pain. 07/28/20   Baldwin Jamaica, PA-C  pantoprazole (PROTONIX) 40 MG tablet Take 1 tablet (40 mg total) by mouth daily. Patient taking differently: Take 40 mg by mouth every morning. 03/10/20   Angiulli, Lavon Paganini, PA-C  traZODone (DESYREL) 50 MG tablet Take 1 tablet (50 mg total) by mouth at bedtime. Patient taking differently: Take 50 mg by mouth at bedtime as needed for sleep. 03/10/20   Angiulli, Lavon Paganini, PA-C  vitamin B-12 (CYANOCOBALAMIN) 500 MCG tablet Take 1 tablet (500 mcg total) by mouth daily. Patient not taking: Reported on 09/29/2021 06/11/21   Bonnielee Haff, MD      Allergies    Penicillins    Review of Systems   Review of Systems  Genitourinary:  Positive for flank pain.    Physical Exam Updated Vital Signs BP (!) 124/56 (BP Location:  Right Arm)   Pulse 62   Temp 98.4 F (36.9 C) (Oral)   Resp 16   SpO2 100%  Physical Exam  ED Results / Procedures / Treatments   Labs (all labs ordered are listed, but only abnormal results are displayed) Labs Reviewed  COMPREHENSIVE METABOLIC PANEL - Abnormal; Notable for the following components:      Result Value   Sodium 128 (*)    Chloride 89 (*)    CO2 19 (*)    Glucose, Bld 54 (*)    BUN 26 (*)    Creatinine, Ser 3.15 (*)    Calcium 8.3 (*)    Total Protein 5.7 (*)    Albumin 3.2 (*)    AST 43 (*)    Alkaline Phosphatase 142 (*)    Total Bilirubin 1.6 (*)    GFR, Estimated 19 (*)    Anion gap 20 (*)     All other components within normal limits  CBC - Abnormal; Notable for the following components:   RBC 3.11 (*)    Hemoglobin 10.4 (*)    HCT 29.6 (*)    All other components within normal limits  LIPASE, BLOOD  URINALYSIS, ROUTINE W REFLEX MICROSCOPIC    EKG None  Radiology No results found.  Procedures Procedures  {Document cardiac monitor, telemetry assessment procedure when appropriate:1}  Medications Ordered in ED Medications - No data to display  ED Course/ Medical Decision Making/ A&P Patient with a fall but no injury.  He is missing his dialysis today so I spoke with nephrology and they stated to try to get him into dialysis tomorrow if he cannot get dialysis tomorrow he will definitely have to get it Wednesday.  If he has a problem prior to that time he should return to the emergency department.  Patient also has a low glucose of 54.  He was given a meal to eat and stated he felt fine                         Medical Decision Making Amount and/or Complexity of Data Reviewed Labs: ordered.   Follow-up with hypoglycemia  {Document critical care time when appropriate:1} {Document review of labs and clinical decision tools ie heart score, Chads2Vasc2 etc:1}  {Document your independent review of radiology images, and any outside records:1} {Document your discussion with family members, caretakers, and with consultants:1} {Document social determinants of health affecting pt's care:1} {Document your decision making why or why not admission, treatments were needed:1} Final Clinical Impression(s) / ED Diagnoses Final diagnoses:  Fall, initial encounter    Rx / DC Orders ED Discharge Orders     None

## 2021-11-08 NOTE — Discharge Instructions (Signed)
Take Tylenol for pain.  Call your dialysis center to see if they can do dialysis tomorrow.  If they cannot do dialysis tomorrow you will have to get dialysis on Wednesday.   If you do not get dialysis tomorrow and you feel short of breath then just come to the emergency department to be seen

## 2021-11-08 NOTE — ED Notes (Signed)
Pt discharged to lobby, provided phone number & phone to call cab or sister for ride home. Pt agreeable to plan, a&o, ambulatory w no assistance at discharge.

## 2021-11-08 NOTE — ED Notes (Signed)
Got patient into a gown on the monitor got patient warm blanket patient is resting with call bell in reach

## 2021-11-08 NOTE — ED Notes (Signed)
CBG will not cross over; reading 43. Pt provided coke & [place] sandwich, EDP made aware

## 2021-11-10 DIAGNOSIS — Z992 Dependence on renal dialysis: Secondary | ICD-10-CM | POA: Diagnosis not present

## 2021-11-10 DIAGNOSIS — D631 Anemia in chronic kidney disease: Secondary | ICD-10-CM | POA: Diagnosis not present

## 2021-11-10 DIAGNOSIS — D509 Iron deficiency anemia, unspecified: Secondary | ICD-10-CM | POA: Diagnosis not present

## 2021-11-10 DIAGNOSIS — N186 End stage renal disease: Secondary | ICD-10-CM | POA: Diagnosis not present

## 2021-11-10 DIAGNOSIS — N2581 Secondary hyperparathyroidism of renal origin: Secondary | ICD-10-CM | POA: Diagnosis not present

## 2021-11-12 ENCOUNTER — Telehealth: Payer: Self-pay | Admitting: Family

## 2021-11-12 DIAGNOSIS — D631 Anemia in chronic kidney disease: Secondary | ICD-10-CM | POA: Diagnosis not present

## 2021-11-12 DIAGNOSIS — D509 Iron deficiency anemia, unspecified: Secondary | ICD-10-CM | POA: Diagnosis not present

## 2021-11-12 DIAGNOSIS — N2581 Secondary hyperparathyroidism of renal origin: Secondary | ICD-10-CM | POA: Diagnosis not present

## 2021-11-12 DIAGNOSIS — Z992 Dependence on renal dialysis: Secondary | ICD-10-CM | POA: Diagnosis not present

## 2021-11-12 DIAGNOSIS — N186 End stage renal disease: Secondary | ICD-10-CM | POA: Diagnosis not present

## 2021-11-12 NOTE — Telephone Encounter (Signed)
I called pt to advise of message below phone rang without answer. I will hold and try again.

## 2021-11-12 NOTE — Telephone Encounter (Signed)
Was ordered by his pcp dr Truman Hayward. Does not need refill

## 2021-11-12 NOTE — Telephone Encounter (Signed)
Pt is calling about an ABx tht was supposed to be called into pharm? Was in the office 11/02/21

## 2021-11-12 NOTE — Telephone Encounter (Signed)
Patient called wanting to know if his antibiotic was sent into the pharmacy. I didn't see it listed under his meds in his chart. CB # [phone]

## 2021-11-15 DIAGNOSIS — D631 Anemia in chronic kidney disease: Secondary | ICD-10-CM | POA: Diagnosis not present

## 2021-11-15 DIAGNOSIS — D509 Iron deficiency anemia, unspecified: Secondary | ICD-10-CM | POA: Diagnosis not present

## 2021-11-15 DIAGNOSIS — Z992 Dependence on renal dialysis: Secondary | ICD-10-CM | POA: Diagnosis not present

## 2021-11-15 DIAGNOSIS — N186 End stage renal disease: Secondary | ICD-10-CM | POA: Diagnosis not present

## 2021-11-15 DIAGNOSIS — N2581 Secondary hyperparathyroidism of renal origin: Secondary | ICD-10-CM | POA: Diagnosis not present

## 2021-11-15 NOTE — Telephone Encounter (Signed)
Pt informed. I did cancel his apt for tomorrow with Junie Panning as she is not going to be here. He is in dialysis Mon, [place], and Friday. I will see if I can get him in with another provider this week. He asked for me to call him back and leave a VM

## 2021-11-16 ENCOUNTER — Ambulatory Visit: Payer: Medicare Other | Admitting: Family

## 2021-11-16 NOTE — Telephone Encounter (Signed)
Pt is scheduled with Bevely Palmer 11/18/21

## 2021-11-17 DIAGNOSIS — N2581 Secondary hyperparathyroidism of renal origin: Secondary | ICD-10-CM | POA: Diagnosis not present

## 2021-11-17 DIAGNOSIS — Z992 Dependence on renal dialysis: Secondary | ICD-10-CM | POA: Diagnosis not present

## 2021-11-17 DIAGNOSIS — D509 Iron deficiency anemia, unspecified: Secondary | ICD-10-CM | POA: Diagnosis not present

## 2021-11-17 DIAGNOSIS — N186 End stage renal disease: Secondary | ICD-10-CM | POA: Diagnosis not present

## 2021-11-17 DIAGNOSIS — D631 Anemia in chronic kidney disease: Secondary | ICD-10-CM | POA: Diagnosis not present

## 2021-11-18 ENCOUNTER — Ambulatory Visit: Payer: Medicare Other | Admitting: Physician Assistant

## 2021-11-19 ENCOUNTER — Emergency Department (HOSPITAL_COMMUNITY): Payer: Medicare Other

## 2021-11-19 ENCOUNTER — Other Ambulatory Visit: Payer: Self-pay

## 2021-11-19 ENCOUNTER — Encounter (HOSPITAL_COMMUNITY): Payer: Self-pay

## 2021-11-19 ENCOUNTER — Emergency Department (HOSPITAL_COMMUNITY)
Admission: EM | Admit: 2021-11-19 | Discharge: 2021-11-20 | Disposition: A | Discharge status: Home / Self Care | Payer: Medicare Other | Attending: Emergency Medicine | Admitting: Emergency Medicine

## 2021-11-19 DIAGNOSIS — D61818 Other pancytopenia: Secondary | ICD-10-CM

## 2021-11-19 DIAGNOSIS — I700 Atherosclerosis of aorta: Secondary | ICD-10-CM | POA: Insufficient documentation

## 2021-11-19 DIAGNOSIS — M545 Low back pain, unspecified: Secondary | ICD-10-CM

## 2021-11-19 DIAGNOSIS — N186 End stage renal disease: Secondary | ICD-10-CM | POA: Insufficient documentation

## 2021-11-19 DIAGNOSIS — Y92002 Bathroom of unspecified non-institutional (private) residence single-family (private) house as the place of occurrence of the external cause: Secondary | ICD-10-CM | POA: Insufficient documentation

## 2021-11-19 DIAGNOSIS — S069X9A Unspecified intracranial injury with loss of consciousness of unspecified duration, initial encounter: Secondary | ICD-10-CM | POA: Diagnosis not present

## 2021-11-19 DIAGNOSIS — S0990XA Unspecified injury of head, initial encounter: Secondary | ICD-10-CM | POA: Diagnosis not present

## 2021-11-19 DIAGNOSIS — Z992 Dependence on renal dialysis: Secondary | ICD-10-CM | POA: Insufficient documentation

## 2021-11-19 DIAGNOSIS — W19XXXA Unspecified fall, initial encounter: Secondary | ICD-10-CM | POA: Diagnosis not present

## 2021-11-19 DIAGNOSIS — I951 Orthostatic hypotension: Secondary | ICD-10-CM | POA: Diagnosis not present

## 2021-11-19 DIAGNOSIS — W01198A Fall on same level from slipping, tripping and stumbling with subsequent striking against other object, initial encounter: Secondary | ICD-10-CM | POA: Insufficient documentation

## 2021-11-19 DIAGNOSIS — M47812 Spondylosis without myelopathy or radiculopathy, cervical region: Secondary | ICD-10-CM | POA: Diagnosis not present

## 2021-11-19 DIAGNOSIS — R55 Syncope and collapse: Secondary | ICD-10-CM | POA: Insufficient documentation

## 2021-11-19 DIAGNOSIS — K8020 Calculus of gallbladder without cholecystitis without obstruction: Secondary | ICD-10-CM | POA: Insufficient documentation

## 2021-11-19 DIAGNOSIS — S3991XA Unspecified injury of abdomen, initial encounter: Secondary | ICD-10-CM | POA: Diagnosis not present

## 2021-11-19 DIAGNOSIS — S0101XA Laceration without foreign body of scalp, initial encounter: Secondary | ICD-10-CM | POA: Diagnosis not present

## 2021-11-19 DIAGNOSIS — Z043 Encounter for examination and observation following other accident: Secondary | ICD-10-CM | POA: Diagnosis not present

## 2021-11-19 DIAGNOSIS — G319 Degenerative disease of nervous system, unspecified: Secondary | ICD-10-CM | POA: Diagnosis not present

## 2021-11-19 DIAGNOSIS — R58 Hemorrhage, not elsewhere classified: Secondary | ICD-10-CM | POA: Diagnosis not present

## 2021-11-19 DIAGNOSIS — I959 Hypotension, unspecified: Secondary | ICD-10-CM | POA: Diagnosis not present

## 2021-11-19 DIAGNOSIS — I6782 Cerebral ischemia: Secondary | ICD-10-CM | POA: Diagnosis not present

## 2021-11-19 DIAGNOSIS — K5730 Diverticulosis of large intestine without perforation or abscess without bleeding: Secondary | ICD-10-CM | POA: Insufficient documentation

## 2021-11-19 DIAGNOSIS — K429 Umbilical hernia without obstruction or gangrene: Secondary | ICD-10-CM | POA: Insufficient documentation

## 2021-11-19 DIAGNOSIS — T8249XA Other complication of vascular dialysis catheter, initial encounter: Secondary | ICD-10-CM | POA: Diagnosis not present

## 2021-11-19 DIAGNOSIS — S199XXA Unspecified injury of neck, initial encounter: Secondary | ICD-10-CM | POA: Diagnosis not present

## 2021-11-19 LAB — BASIC METABOLIC PANEL
Anion gap: 15 (ref 5–15)
BUN: 26 mg/dL — ABNORMAL HIGH (ref 8–23)
CO2: 25 mmol/L (ref 22–32)
Calcium: 8.9 mg/dL (ref 8.9–10.3)
Chloride: 90 mmol/L — ABNORMAL LOW (ref 98–111)
Creatinine, Ser: 3.02 mg/dL — ABNORMAL HIGH (ref 0.61–1.24)
GFR, Estimated: 20 mL/min — ABNORMAL LOW (ref >60)
Glucose, Bld: 86 mg/dL (ref 70–99)
Potassium: 4.2 mmol/L (ref 3.5–5.1)
Sodium: 130 mmol/L — ABNORMAL LOW (ref 135–145)

## 2021-11-19 LAB — HEPATITIS B SURFACE ANTIGEN: Hepatitis B Surface Ag: NONREACTIVE

## 2021-11-19 LAB — CBC
HCT: 24 % — ABNORMAL LOW (ref 39.0–52.0)
Hemoglobin: 8.4 g/dL — ABNORMAL LOW (ref 13.0–17.0)
MCH: 34.9 pg — ABNORMAL HIGH (ref 26.0–34.0)
MCHC: 35 g/dL (ref 30.0–36.0)
MCV: 99.6 fL (ref 80.0–100.0)
Platelets: 129 10*3/uL — ABNORMAL LOW (ref 150–400)
RBC: 2.41 MIL/uL — ABNORMAL LOW (ref 4.22–5.81)
RDW: 15.7 % — ABNORMAL HIGH (ref 11.5–15.5)
WBC: 3.6 10*3/uL — ABNORMAL LOW (ref 4.0–10.5)
nRBC: 0 % (ref 0.0–0.2)

## 2021-11-19 LAB — HEPATITIS B SURFACE ANTIBODY,QUALITATIVE: Hep B S Ab: REACTIVE — AB

## 2021-11-19 LAB — HEPATITIS C ANTIBODY: HCV Ab: NONREACTIVE

## 2021-11-19 LAB — HEPATITIS B CORE ANTIBODY, TOTAL: Hep B Core Total Ab: NONREACTIVE

## 2021-11-19 MED ORDER — OXYCODONE-ACETAMINOPHEN 5-325 MG PO TABS
1.0000 | ORAL_TABLET | Freq: Once | ORAL | Status: AC
  Administered 2021-11-19: 1 via ORAL
  Filled 2021-11-19: qty 1

## 2021-11-19 MED ORDER — PENTAFLUOROPROP-TETRAFLUOROETH EX AERO
INHALATION_SPRAY | CUTANEOUS | Status: AC
  Filled 2021-11-19: qty 103.5

## 2021-11-19 MED ORDER — CHLORHEXIDINE GLUCONATE CLOTH 2 % EX PADS: 6.0000 | MEDICATED_PAD | Freq: Every day | CUTANEOUS | Status: DC

## 2021-11-19 MED ORDER — IOHEXOL 300 MG/ML  SOLN
80.0000 mL | Freq: Once | INTRAMUSCULAR | Status: AC | PRN
  Administered 2021-11-19: 80 mL via INTRAVENOUS

## 2021-11-19 NOTE — ED Notes (Signed)
Transported to dialysis.

## 2021-11-19 NOTE — ED Provider Notes (Signed)
[facility] EMERGENCY DEPARTMENT Provider Note   CSN: [id number] Arrival date & time: 11/19/21  1123     History  Chief Complaint  Patient presents with   Darryl Diaz is a 81 y.o. male.   Fall  Patient presents after fall.  Was going to dialysis lost his balance and hit his head on the cement ground.  Bleeding to the back head.  Not on blood thinners.  No neck pain.  No chest pain.  States he was going to go to dialysis and last went Wednesday.  He is a Monday Wednesday Friday dialysis patient.  No chest pain or trouble breathing.  No loss consciousness.  Has had previous falls.  Blood pressure was little low for EMS and states his blood pressure will run low at times.     Home Medications Prior to Admission medications   Medication Sig Start Date End Date Taking? Authorizing Provider  acetaminophen (TYLENOL) 325 MG tablet Take 2 tablets (650 mg total) by mouth every 4 (four) hours as needed for headache or mild pain. Patient taking differently: Take 325-650 mg by mouth every 4 (four) hours as needed for headache. 03/10/20   Angiulli, Lavon Paganini, PA-C  albuterol (PROVENTIL) (2.5 MG/3ML) 0.083% nebulizer solution Take 3 mLs (2.5 mg total) by nebulization every 6 (six) hours as needed for wheezing or shortness of breath. Patient not taking: Reported on 09/29/2021 06/14/21   Bonnielee Haff, MD  amiodarone (PACERONE) 200 MG tablet TAKE 1 TABLET BY MOUTH ONCE DAILY Patient taking differently: Take 200 mg by mouth every morning. 05/17/21   Richardson Dopp T, PA-C  atorvastatin (LIPITOR) 20 MG tablet Take 1 tablet (20 mg total) by mouth daily. Patient taking differently: Take 20 mg by mouth every morning. 03/10/20   Angiulli, Lavon Paganini, PA-C  carvedilol (COREG) 6.25 MG tablet Take 1 tablet (6.25 mg total) by mouth 2 (two) times daily. 02/11/21   Sherren Mocha, MD  cetirizine (ZYRTEC) 10 MG tablet Take 10 mg by mouth daily. Patient not taking: Reported on 06/08/2021     [provider]  escitalopram (LEXAPRO) 10 MG tablet Take 1 tablet (10 mg total) by mouth daily. Patient taking differently: Take 10 mg by mouth every morning. 03/10/20   Angiulli, Lavon Paganini, PA-C  ferrous sulfate 324 MG TBEC Take 324 mg by mouth every morning.    [provider]  FOLIC ACID PO Take 1 tablet by mouth every morning.    [provider]  Magnesium Oxide 400 MG CAPS Take 1 capsule (400 mg total) by mouth daily. 10/17/20   Eugenie Filler, MD  Multiple Vitamins-Minerals (CENTRUM SILVER 50+MEN) TABS Take 1 tablet by mouth every other day.    [provider]  nitroGLYCERIN (NITROSTAT) 0.4 MG SL tablet Place 1 tablet (0.4 mg total) under the tongue every 5 (five) minutes x 3 doses as needed for chest pain. 07/28/20   Baldwin Jamaica, PA-C  pantoprazole (PROTONIX) 40 MG tablet Take 1 tablet (40 mg total) by mouth daily. Patient taking differently: Take 40 mg by mouth every morning. 03/10/20   Angiulli, Lavon Paganini, PA-C  traZODone (DESYREL) 50 MG tablet Take 1 tablet (50 mg total) by mouth at bedtime. Patient taking differently: Take 50 mg by mouth at bedtime as needed for sleep. 03/10/20   Angiulli, Lavon Paganini, PA-C  vitamin B-12 (CYANOCOBALAMIN) 500 MCG tablet Take 1 tablet (500 mcg total) by mouth daily. Patient not taking: Reported on  09/29/2021 06/11/21   Bonnielee Haff, MD      Allergies    Penicillins    Review of Systems   Review of Systems  Physical Exam Updated Vital Signs BP (!) 103/52 (BP Location: Right Arm)   Pulse 63   Temp 98.5 F (36.9 C) (Oral)   Resp 18   SpO2 95%  Physical Exam Vitals reviewed.  HENT:     Head:     Comments: Approximately 3 cm occipital laceration.  No active bleeding. Cardiovascular:     Rate and Rhythm: Regular rhythm.  Pulmonary:     Breath sounds: No wheezing or rhonchi.  Abdominal:     Tenderness: There is no abdominal tenderness.  Musculoskeletal:        General: No tenderness.     Cervical  back: Neck supple.  Skin:    General: Skin is warm.     Capillary Refill: Capillary refill takes less than 2 seconds.  Neurological:     Mental Status: He is alert and oriented to person, place, and time.     ED Results / Procedures / Treatments   Labs (all labs ordered are listed, but only abnormal results are displayed) Labs Reviewed  BASIC METABOLIC PANEL - Abnormal; Notable for the following components:      Result Value   Sodium 130 (*)    Chloride 90 (*)    BUN 26 (*)    Creatinine, Ser 3.02 (*)    GFR, Estimated 20 (*)    All other components within normal limits  CBC - Abnormal; Notable for the following components:   WBC 3.6 (*)    RBC 2.41 (*)    Hemoglobin 8.4 (*)    HCT 24.0 (*)    MCH 34.9 (*)    RDW 15.7 (*)    Platelets 129 (*)    All other components within normal limits  HEPATITIS B SURFACE ANTIGEN  HEPATITIS B SURFACE ANTIBODY,QUALITATIVE  HEPATITIS B SURFACE ANTIBODY, QUANTITATIVE  HEPATITIS B CORE ANTIBODY, TOTAL  HEPATITIS C ANTIBODY    EKG None  Radiology CT HEAD WO CONTRAST (5MM)  Result Date: 11/19/2021 CLINICAL DATA:  Head trauma, moderate-severe EXAM: CT HEAD WITHOUT CONTRAST TECHNIQUE: Contiguous axial images were obtained from the base of the skull through the vertex without intravenous contrast. RADIATION DOSE REDUCTION: This exam was performed according to the departmental dose-optimization program which includes automated exposure control, adjustment of the mA and/or kV according to patient size and/or use of iterative reconstruction technique. COMPARISON:  09/27/2021 FINDINGS: Brain: There is no acute intracranial hemorrhage, mass effect, or edema. Gray-white differentiation is preserved. There is no extra-axial fluid collection. Prominence of the ventricles and sulci reflects stable parenchymal volume loss. Patchy hypoattenuation in the supratentorial white matter probably reflects stable chronic microvascular ischemic changes. Vascular:  There is atherosclerotic calcification at the skull base. Skull: Calvarium is unremarkable. Sinuses/Orbits: No acute finding. Other: None. IMPRESSION: No evidence of acute intracranial injury. Electronically Signed   By: Macy Mis M.D.   On: 11/19/2021 13:12    Procedures .Marland KitchenLaceration Repair  Date/Time: 11/19/2021 1:01 PM  Performed by: Davonna Belling, MD Authorized by: Davonna Belling, MD   Consent:    Consent obtained:  Verbal   Consent given by:  Patient   Risks, benefits, and alternatives were discussed: yes   Universal protocol:    Patient identity confirmed:  Verbally with patient Anesthesia:    Anesthesia method:  None Laceration details:    Location:  Scalp  Scalp location:  Occipital   Length (cm):  3 Exploration:    Limited defect created (wound extended): no   Treatment:    Area cleansed with:  Chlorhexidine   Amount of cleaning:  Standard Skin repair:    Repair method:  Staples   Number of staples:  3 Repair type:    Repair type:  Simple Post-procedure details:    Dressing:  Open (no dressing)     Medications Ordered in ED Medications  oxyCODONE-acetaminophen (PERCOCET/ROXICET) 5-325 MG per tablet 1 tablet (has no administration in time range)  Chlorhexidine Gluconate Cloth 2 % PADS 6 each (has no administration in time range)    ED Course/ Medical Decision Making/ A&P                           Medical Decision Making Amount and/or Complexity of Data Reviewed Labs: ordered. Radiology: ordered.  Risk Prescription drug management.   Patient presents after mechanical fall.  States lost his balance.  Hit his head with laceration.  Wound closed.  No loss conscious.  Not on blood thinners.  Will get head CT due to age and risk.  Also dialysis patient.  Due to have dialysis today and will not be able to get that done today.    Discussed with Dr. Jonnie Finner.  He is able to get dialyzed in the hospital today, likely around 4:00.   Does have a mild  anemia and patient later began complaining more pain in neck and back.  We will add CT scan of his cervical spine lumbar spine and abdomen pelvis.  There is some mild ecchymosis in the left flank.  We will get this done before dialysis.  Care was turned over to oncoming provider, with whom I discussed with the patient.        Final Clinical Impression(s) / ED Diagnoses Final diagnoses:  Fall, initial encounter  Laceration of scalp, initial encounter  Acute left-sided low back pain without sciatica    Rx / DC Orders ED Discharge Orders     None         Davonna Belling, MD 11/19/21 1446

## 2021-11-19 NOTE — ED Triage Notes (Addendum)
Pt coming from home. Was outside trying to get in car to go to dialysis. Fell, hit head on brick wall by back door. Bleeding controlled. Not on blood thinners. EMS placed sheet around neck but pt did not complain of neck pain. BP soft but pt reports that is his baseline. Uses a rollator at home. Lives alone.

## 2021-11-19 NOTE — Progress Notes (Signed)
Called to see if we can provide HD for this patient who missed dialysis today due to spending the day in the ED after a fall. His next OP HD would be Monday. He does not require admission. Plan is for "ED HD" this afternoon, or at the latest this evening.   Pt has hx of etoh abuse, anemia, anxiety, atrial fib, ESRD on HD, GIB, HL,  HTN, depression. Last admit in May 2023 was for falls as well.    OP HD: [facility] MWF  4h  350/ 1.5 2/2 bath LUA AVF   Hep none  Kelly Splinter, MD 11/19/2021, 4:00 PM

## 2021-11-19 NOTE — Progress Notes (Signed)
Contacted by ED provider with request to contact pt's out-pt HD clinic to inquire if pt can make-up today's missed treatment tomorrow. Contacted [facility] and spoke to Bell, Therapist, sports. Navigator advised that clinic has no availability to treat pt tomorrow (pt is normally MWF). Update provided to ED provider who will f/u with nephrologist.   Melven Sartorius Renal Navigator [phone]

## 2021-11-20 ENCOUNTER — Emergency Department (HOSPITAL_COMMUNITY): Payer: Medicare Other

## 2021-11-20 ENCOUNTER — Other Ambulatory Visit: Payer: Self-pay

## 2021-11-20 ENCOUNTER — Emergency Department (HOSPITAL_COMMUNITY)
Admission: EM | Admit: 2021-11-20 | Discharge: 2021-11-20 | Disposition: A | Discharge status: Home / Self Care | Payer: Medicare Other | Source: Home / Self Care | Attending: Emergency Medicine | Admitting: Emergency Medicine

## 2021-11-20 ENCOUNTER — Encounter (HOSPITAL_COMMUNITY): Payer: Self-pay | Admitting: Emergency Medicine

## 2021-11-20 DIAGNOSIS — N186 End stage renal disease: Secondary | ICD-10-CM | POA: Insufficient documentation

## 2021-11-20 DIAGNOSIS — Y9301 Activity, walking, marching and hiking: Secondary | ICD-10-CM | POA: Insufficient documentation

## 2021-11-20 DIAGNOSIS — W19XXXA Unspecified fall, initial encounter: Secondary | ICD-10-CM | POA: Insufficient documentation

## 2021-11-20 DIAGNOSIS — I959 Hypotension, unspecified: Secondary | ICD-10-CM | POA: Diagnosis not present

## 2021-11-20 DIAGNOSIS — M47812 Spondylosis without myelopathy or radiculopathy, cervical region: Secondary | ICD-10-CM | POA: Diagnosis not present

## 2021-11-20 DIAGNOSIS — I951 Orthostatic hypotension: Secondary | ICD-10-CM

## 2021-11-20 DIAGNOSIS — T8249XA Other complication of vascular dialysis catheter, initial encounter: Secondary | ICD-10-CM | POA: Insufficient documentation

## 2021-11-20 DIAGNOSIS — I6782 Cerebral ischemia: Secondary | ICD-10-CM | POA: Diagnosis not present

## 2021-11-20 DIAGNOSIS — S0101XA Laceration without foreign body of scalp, initial encounter: Secondary | ICD-10-CM | POA: Diagnosis not present

## 2021-11-20 DIAGNOSIS — Z7401 Bed confinement status: Secondary | ICD-10-CM | POA: Diagnosis not present

## 2021-11-20 DIAGNOSIS — R55 Syncope and collapse: Secondary | ICD-10-CM | POA: Insufficient documentation

## 2021-11-20 DIAGNOSIS — S069X9A Unspecified intracranial injury with loss of consciousness of unspecified duration, initial encounter: Secondary | ICD-10-CM | POA: Diagnosis not present

## 2021-11-20 DIAGNOSIS — R4020 Unspecified coma: Secondary | ICD-10-CM | POA: Diagnosis not present

## 2021-11-20 DIAGNOSIS — S199XXA Unspecified injury of neck, initial encounter: Secondary | ICD-10-CM | POA: Diagnosis not present

## 2021-11-20 DIAGNOSIS — T829XXA Unspecified complication of cardiac and vascular prosthetic device, implant and graft, initial encounter: Secondary | ICD-10-CM

## 2021-11-20 DIAGNOSIS — Z992 Dependence on renal dialysis: Secondary | ICD-10-CM | POA: Insufficient documentation

## 2021-11-20 DIAGNOSIS — Y92002 Bathroom of unspecified non-institutional (private) residence single-family (private) house as the place of occurrence of the external cause: Secondary | ICD-10-CM | POA: Insufficient documentation

## 2021-11-20 DIAGNOSIS — G319 Degenerative disease of nervous system, unspecified: Secondary | ICD-10-CM | POA: Diagnosis not present

## 2021-11-20 LAB — COMPREHENSIVE METABOLIC PANEL
ALT: 19 U/L (ref 0–44)
AST: 32 U/L (ref 15–41)
Albumin: 3.1 g/dL — ABNORMAL LOW (ref 3.5–5.0)
Alkaline Phosphatase: 114 U/L (ref 38–126)
Anion gap: 12 (ref 5–15)
BUN: 14 mg/dL (ref 8–23)
CO2: 26 mmol/L (ref 22–32)
Calcium: 8 mg/dL — ABNORMAL LOW (ref 8.9–10.3)
Chloride: 96 mmol/L — ABNORMAL LOW (ref 98–111)
Creatinine, Ser: 2.01 mg/dL — ABNORMAL HIGH (ref 0.61–1.24)
GFR, Estimated: 33 mL/min — ABNORMAL LOW (ref >60)
Glucose, Bld: 115 mg/dL — ABNORMAL HIGH (ref 70–99)
Potassium: 3.6 mmol/L (ref 3.5–5.1)
Sodium: 134 mmol/L — ABNORMAL LOW (ref 135–145)
Total Bilirubin: 1.6 mg/dL — ABNORMAL HIGH (ref 0.3–1.2)
Total Protein: 5.1 g/dL — ABNORMAL LOW (ref 6.5–8.1)

## 2021-11-20 LAB — CBC
HCT: 25.2 % — ABNORMAL LOW (ref 39.0–52.0)
Hemoglobin: 8.3 g/dL — ABNORMAL LOW (ref 13.0–17.0)
MCH: 33.9 pg (ref 26.0–34.0)
MCHC: 32.9 g/dL (ref 30.0–36.0)
MCV: 102.9 fL — ABNORMAL HIGH (ref 80.0–100.0)
Platelets: 121 10*3/uL — ABNORMAL LOW (ref 150–400)
RBC: 2.45 MIL/uL — ABNORMAL LOW (ref 4.22–5.81)
RDW: 16.2 % — ABNORMAL HIGH (ref 11.5–15.5)
WBC: 3.5 10*3/uL — ABNORMAL LOW (ref 4.0–10.5)
nRBC: 0 % (ref 0.0–0.2)

## 2021-11-20 LAB — HEPATITIS B SURFACE ANTIBODY, QUANTITATIVE: Hep B S AB Quant (Post): 24.4 m[IU]/mL (ref >9.9)

## 2021-11-20 LAB — TROPONIN I (HIGH SENSITIVITY)
Troponin I (High Sensitivity): 6 ng/L (ref <18)
Troponin I (High Sensitivity): 7 ng/L (ref <18)

## 2021-11-20 MED ORDER — LACTATED RINGERS IV BOLUS
500.0000 mL | Freq: Once | INTRAVENOUS | Status: AC
  Administered 2021-11-20: 500 mL via INTRAVENOUS

## 2021-11-20 NOTE — ED Triage Notes (Signed)
Pt BIB [facility], seen in ED yesterday, had dialysis last night and went home with PTAR. Reports feeling fatigued from the day, but otherwise normal after dialysis. States he was trying to walk to the bathroom when he had a fall, on EMS arrival, pt had witnessed syncopal episode on standing, initial SBP 70. Given 250ccNS pta.

## 2021-11-20 NOTE — ED Provider Notes (Signed)
[facility] EMERGENCY DEPARTMENT Provider Note   CSN: [id number] Arrival date & time: 11/20/21  5638     History  Chief Complaint  Patient presents with   Loss of Consciousness    Darryl Diaz is a 81 y.o. male.  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  Patient was seen and evaluated yesterday after a fall.  At that time he struck his head and had injury to his head.  He had CT scan obtained of his head, neck and then had dialysis while here is the ED.  He denied chest or loss of consciousness.  By report at that time, his blood pressure was "low.  Today, he was being returned to his home by EMS.  Per RN report [facility]R was taking patient home.  He was trying to walk to the bathroom when he had a fall.  EMS returned to the scene and they witnessed a syncopal episode on standing up.  At that time his systolic blood pressure was 70.  Patient was given 250 cc of normal saline.  He denies any new injury without fall.     Home Medications Prior to Admission medications   Medication Sig Start Date End Date Taking? Authorizing Provider  acetaminophen (TYLENOL) 325 MG tablet Take 2 tablets (650 mg total) by mouth every 4 (four) hours as needed for headache or mild pain. Patient taking differently: Take 325-650 mg by mouth every 4 (four) hours as needed for headache. 03/10/20   Angiulli, Lavon Paganini, PA-C  albuterol (PROVENTIL) (2.5 MG/3ML) 0.083% nebulizer solution Take 3 mLs (2.5 mg total) by nebulization every 6 (six) hours as needed for wheezing or shortness of breath. Patient not taking: Reported on 09/29/2021 06/14/21   Bonnielee Haff, MD  amiodarone (PACERONE) 200 MG tablet TAKE 1 TABLET BY MOUTH ONCE DAILY Patient taking differently: Take 200 mg by mouth every morning. 05/17/21   Richardson Dopp T, PA-C  atorvastatin (LIPITOR) 20 MG tablet Take 1 tablet (20 mg total) by mouth daily. Patient taking differently: Take 20 mg  by mouth every morning. 03/10/20   Angiulli, Lavon Paganini, PA-C  carvedilol (COREG) 6.25 MG tablet Take 1 tablet (6.25 mg total) by mouth 2 (two) times daily. 02/11/21   Sherren Mocha, MD  cetirizine (ZYRTEC) 10 MG tablet Take 10 mg by mouth daily. Patient not taking: Reported on 06/08/2021    [provider]  escitalopram (LEXAPRO) 10 MG tablet Take 1 tablet (10 mg total) by mouth daily. Patient taking differently: Take 10 mg by mouth every morning. 03/10/20   Angiulli, Lavon Paganini, PA-C  ferrous sulfate 324 MG TBEC Take 324 mg by mouth every morning.    [provider]  FOLIC ACID PO Take 1 tablet by mouth every morning.    [provider]  Magnesium Oxide 400 MG CAPS Take 1 capsule (400 mg total) by mouth daily. 10/17/20   Eugenie Filler, MD  Multiple Vitamins-Minerals (CENTRUM SILVER 50+MEN) TABS Take 1 tablet by mouth every other day.    [provider]  nitroGLYCERIN (NITROSTAT) 0.4 MG SL tablet Place 1 tablet (0.4 mg total) under the tongue every 5 (five) minutes x 3 doses as needed for chest pain. 07/28/20   Baldwin Jamaica, PA-C  pantoprazole (PROTONIX) 40 MG tablet Take 1 tablet (40 mg total) by mouth daily. Patient taking differently: Take 40 mg by mouth every morning. 03/10/20   Reynolds, Lavon Paganini, PA-C  traZODone (DESYREL)  50 MG tablet Take 1 tablet (50 mg total) by mouth at bedtime. Patient taking differently: Take 50 mg by mouth at bedtime as needed for sleep. 03/10/20   Angiulli, Lavon Paganini, PA-C  vitamin B-12 (CYANOCOBALAMIN) 500 MCG tablet Take 1 tablet (500 mcg total) by mouth daily. Patient not taking: Reported on 09/29/2021 06/11/21   Bonnielee Haff, MD      Allergies    Penicillin g sodium and Penicillins    Review of Systems   Review of Systems  Physical Exam Updated Vital Signs BP (!) 106/47   Pulse 73   Temp 98.8 F (37.1 C) (Oral)   Resp 16   SpO2 96%  Physical Exam Vitals and nursing note reviewed.  Constitutional:       Appearance: Normal appearance.  HENT:     Head: Normocephalic.     Comments: Occipital wound with a pulse in place mildly tender    Right Ear: External ear normal.     Left Ear: External ear normal.     Nose: Nose normal.     Mouth/Throat:     Pharynx: Oropharynx is clear.  Eyes:     Pupils: Pupils are equal, round, and reactive to light.  Cardiovascular:     Rate and Rhythm: Normal rate and regular rhythm.     Pulses: Normal pulses.     Heart sounds: Normal heart sounds.  Pulmonary:     Effort: Pulmonary effort is normal.  Abdominal:     General: Abdomen is flat. Bowel sounds are normal.  Musculoskeletal:        General: Normal range of motion.     Cervical back: Normal range of motion and neck supple. No tenderness.     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     Coloration: Skin is pale.  Neurological:     General: No focal deficit present.     Mental Status: He is alert.  Psychiatric:        Mood and Affect: Mood normal.     ED Results / Procedures / Treatments   Labs (all labs ordered are listed, but only abnormal results are displayed) Labs Reviewed  CBC - Abnormal; Notable for the following components:      Result Value   WBC 3.5 (*)    RBC 2.45 (*)    Hemoglobin 8.3 (*)    HCT 25.2 (*)    MCV 102.9 (*)    RDW 16.2 (*)    Platelets 121 (*)    All other components within normal limits  COMPREHENSIVE METABOLIC PANEL - Abnormal; Notable for the following components:   Sodium 134 (*)    Chloride 96 (*)    Glucose, Bld  115 (*)    Creatinine, Ser 2.01 (*)    Calcium 8.0 (*)    Total Protein 5.1 (*)    Albumin 3.1 (*)    Total Bilirubin 1.6 (*)    GFR, Estimated 33 (*)    All other components within normal limits  TROPONIN I (HIGH SENSITIVITY)  TROPONIN I (HIGH SENSITIVITY)    EKG EKG Interpretation  Date/Time:  Saturday November 20 2021 06:49:10 EDT Ventricular Rate:  66 PR Interval:  189 QRS Duration: 122 QT Interval:  506 QTC Calculation: 531 R Axis:   -37 Text Interpretation: Sinus rhythm Nonspecific IVCD with LAD When compared with ECG of 06/07/2021, No significant change was found Confirmed by Delora Fuel ([id number]) on 11/20/2021 6:51:47 AM  Radiology CT Head Wo Contrast  Result Date: 11/20/2021 CLINICAL DATA:  81 year old male history of trauma from a fall. Loss of consciousness. EXAM: CT HEAD WITHOUT CONTRAST CT CERVICAL SPINE WITHOUT CONTRAST TECHNIQUE: Multidetector CT imaging of the head and cervical spine was performed following the standard protocol without intravenous contrast. Multiplanar CT image reconstructions of the cervical spine were also generated. RADIATION DOSE REDUCTION: This exam was performed according to the departmental dose-optimization program which includes automated exposure control, adjustment of the mA and/or kV according to patient size and/or use of iterative reconstruction technique. COMPARISON:  Head and cervical spine CT 11/19/2021. FINDINGS: CT HEAD FINDINGS Brain: Severe cerebral and mild cerebellar atrophy. Ex vacuo dilatation of the ventricular system. Patchy and confluent areas of decreased attenuation are noted throughout the deep and periventricular white matter of the cerebral hemispheres bilaterally, compatible with chronic microvascular ischemic disease.No evidence of acute infarction, hemorrhage, hydrocephalus, extra-axial collection or mass lesion/mass effect. Vascular: No hyperdense vessel or unexpected calcification. Skull: Normal. Negative for fracture or  focal lesion. Sinuses/Orbits: No acute finding. Other: None. CT CERVICAL SPINE FINDINGS Alignment: Mild reversal of normal cervical lordosis centered at the level of C4-C5, presumably positional and/or chronic and degenerative in nature. Alignment is otherwise anatomic. Skull base and vertebrae: No acute fracture. No primary bone lesion or focal pathologic process. Soft tissues and spinal canal: No prevertebral fluid or swelling. No visible canal hematoma. Disc levels: Severe multilevel degenerative disc disease, most pronounced at C5-C6 and C6-C7. Severe multilevel facet arthropathy bilaterally. Upper chest: Unremarkable. Other: None. IMPRESSION: 1. No evidence of significant acute traumatic injury to the skull, brain or cervical spine. 2. Severe cerebral and mild cerebellar atrophy with extensive chronic microvascular ischemic changes in the cerebral white matter, as above. 3. 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 Electronically Signed   By: Vinnie Langton M.D.   On: 11/20/2021 08:26   CT Cervical Spine Wo Contrast  Result Date: 11/20/2021 CLINICAL DATA:  81 year old male history of trauma from a fall. Loss of consciousness. EXAM: CT HEAD WITHOUT CONTRAST CT CERVICAL SPINE WITHOUT CONTRAST TECHNIQUE: Multidetector CT imaging of the head and cervical spine was performed following the standard protocol without intravenous contrast. Multiplanar CT image reconstructions of the cervical spine were also generated. RADIATION DOSE REDUCTION: This exam was performed according to the departmental dose-optimization program which includes automated exposure control, adjustment of the mA and/or kV according to patient size and/or use of iterative reconstruction technique. COMPARISON:  Head and cervical spine CT 11/19/2021. FINDINGS: CT HEAD FINDINGS Brain: Severe cerebral and mild cerebellar atrophy. Ex vacuo dilatation of the ventricular system. Patchy and confluent areas of  decreased attenuation are noted throughout the deep and periventricular white matter of the cerebral hemispheres bilaterally, compatible with chronic microvascular ischemic disease.No evidence of acute infarction, hemorrhage, hydrocephalus, extra-axial collection or mass lesion/mass effect. Vascular: No hyperdense vessel or unexpected calcification. Skull: Normal. Negative for fracture or focal lesion. Sinuses/Orbits: No acute finding. Other: None. CT CERVICAL SPINE FINDINGS Alignment: Mild reversal of normal cervical lordosis centered at the level of C4-C5,  presumably positional and/or chronic and degenerative in nature. Alignment is otherwise anatomic. Skull base and vertebrae: No acute fracture. No primary bone lesion or focal pathologic process. Soft tissues and spinal canal: No prevertebral fluid or swelling. No visible canal hematoma. Disc levels: Severe multilevel degenerative disc disease, most pronounced at C5-C6 and C6-C7. Severe multilevel facet arthropathy bilaterally. Upper chest: Unremarkable. Other: None. IMPRESSION: 1. No evidence of significant acute traumatic injury to the skull, brain or cervical spine. 2. Severe cerebral and mild cerebellar atrophy with extensive chronic microvascular ischemic changes in the cerebral white matter, as above. 3. 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 Electronically Signed   By: Vinnie Langton M.D.   On: 11/20/2021 08:26   CT ABDOMEN PELVIS W CONTRAST  Result Date: 11/19/2021 CLINICAL DATA:  Blunt abdominal trauma post fall, fell while going to dialysis EXAM: CT ABDOMEN AND PELVIS WITH CONTRAST TECHNIQUE: Multidetector CT imaging of the abdomen and pelvis was performed using the standard protocol following bolus administration of intravenous contrast. RADIATION DOSE REDUCTION: This exam was performed according to the departmental dose-optimization program which includes automated exposure control, adjustment of the mA  and/or kV according to patient size and/or use of iterative reconstruction technique. CONTRAST:  41m OMNIPAQUE IOHEXOL 300 MG/ML SOLN IV. No oral contrast. COMPARISON:  09/10/2020 FINDINGS: Lower chest: Bibasilar emphysematous changes and mild chronic interstitial prominence. No infiltrate or pneumothorax. Trace pleural fluid. Hepatobiliary: Nodular cirrhotic appearing liver. Few nonspecific low-attenuation foci within the liver, favor tiny cysts up to 5 mm diameter. Calcified gallstones dependently in gallbladder. No biliary dilatation. Few calcified granulomata RIGHT lobe liver superiorly. Pancreas: Normal appearance Spleen: Normal appearance Adrenals/Urinary Tract: 1.5 cm diameter cyst at inferior pole LEFT kidney. Additional tiny exophytic lesion posterior LEFT kidney 6 mm diameter, indeterminate, unchanged. Adrenal glands, kidneys, ureters, and visualized bladder otherwise normal appearance. Stomach/Bowel: Normal appendix. Questionable wall thickening of gastric antrum versus artifact from underdistention. Distal descending and sigmoid diverticulosis without evidence of diverticulitis. Vascular/Lymphatic: Atherosclerotic calcifications of aorta and iliac arteries without aneurysm. Vascular structures patent. Perigastric collaterals. Reproductive: Prostate gland and seminal vesicles obscured by bowel gas Other: No free air or free fluid. Tiny umbilical hernia containing fat. No inflammatory process. Musculoskeletal: Multilevel degenerative disc and facet disease changes lumbar spine with scattered areas of spinal stenosis and neural foraminal stenosis. IMPRESSION: Cirrhotic appearing liver with perigastric collaterals. Cholelithiasis. Distal colonic diverticulosis without evidence of diverticulitis. Tiny umbilical hernia containing fat. No acute intra-abdominal or intrapelvic abnormalities. Aortic Atherosclerosis (ICD10-I70.0). Electronically Signed   By: MLavonia DanaM.D.   On: 11/19/2021 15:32   CT Lumbar  Spine Wo Contrast  Result Date: 11/19/2021 CLINICAL DATA:  Fall, back pain EXAM: CT LUMBAR SPINE WITHOUT CONTRAST TECHNIQUE: Multidetector CT imaging of the lumbar spine was performed without intravenous contrast administration. Multiplanar CT image reconstructions were also generated. RADIATION DOSE REDUCTION: This exam was performed according to the departmental dose-optimization program which includes automated exposure control, adjustment of the mA and/or kV according to patient size and/or use of iterative reconstruction technique. COMPARISON:  CT 03/16/2020 FINDINGS: Segmentation: 5 lumbar type vertebrae. Alignment: Trace degenerative retrolisthesis of L1 on L2. Facet joint alignment is maintained. No traumatic listhesis. Vertebrae: No acute fracture. No suspicious lytic or sclerotic bone lesion. Paraspinal and other soft tissues: Aortic atherosclerosis. Cholelithiasis. Disc levels: Chronic advanced degenerative disc disease and facet arthropathy throughout the lumbar spine. Overall, findings appear slightly progressed compared to the previous CT from 2021. IMPRESSION: 1. No acute fracture or  traumatic listhesis of the lumbar spine. 2. Chronic advanced degenerative disc disease and facet arthropathy throughout the lumbar spine, slightly progressed compared to the previous CT from 2021. 3. Cholelithiasis. 4. Aortic atherosclerosis (ICD10-I70.0). Electronically Signed   By: Davina Poke D.O.   On: 11/19/2021 15:02   CT Cervical Spine Wo Contrast  Result Date: 11/19/2021 CLINICAL DATA:  Patient is status post fall. EXAM: CT CERVICAL SPINE WITHOUT CONTRAST TECHNIQUE: Multidetector CT imaging of the cervical spine was performed without intravenous contrast. Multiplanar CT image reconstructions were also generated. RADIATION DOSE REDUCTION: This exam was performed according to the departmental dose-optimization program which includes automated exposure control, adjustment of the mA and/or kV according to  patient size and/or use of iterative reconstruction technique. COMPARISON:  None Available. FINDINGS: Alignment: Normal. Skull base and vertebrae: No acute fracture. Multilevel degenerative disc disease most pronounced C5-6 and C6-7. Multilevel facet degenerative changes most pronounced left C2-3 and C3-4. Soft tissues and spinal canal: No prevertebral fluid or swelling. No visible canal hematoma. Disc levels:  No fracture Upper chest: Unremarkable. Other: None. IMPRESSION: Multilevel degenerative disc and facet disease.  No acute fracture. Electronically Signed   By: Lovey Newcomer M.D.   On: 11/19/2021 14:55   CT HEAD WO CONTRAST (5MM)  Result Date: 11/19/2021 CLINICAL DATA:  Head trauma, moderate-severe EXAM: CT HEAD WITHOUT CONTRAST TECHNIQUE: Contiguous axial images were obtained from the base of the skull through the vertex without intravenous contrast. RADIATION DOSE REDUCTION: This exam was performed according to the departmental dose-optimization program which includes automated exposure control, adjustment of the mA and/or kV according to patient size and/or use of iterative reconstruction technique. COMPARISON:  09/27/2021 FINDINGS: Brain: There is no acute intracranial hemorrhage, mass effect, or edema. Gray-white differentiation is preserved. There is no extra-axial fluid collection. Prominence of the ventricles and sulci reflects stable parenchymal volume loss. Patchy hypoattenuation in the supratentorial white matter probably reflects stable chronic microvascular ischemic changes. Vascular: There is atherosclerotic calcification at the skull base. Skull: Calvarium is unremarkable. Sinuses/Orbits: No acute finding. Other: None. IMPRESSION: No evidence of acute intracranial injury. Electronically Signed   By: Macy Mis M.D.   On: 11/19/2021 13:12    Procedures Procedures    Medications Ordered in ED Medications  lactated ringers bolus 500 mL (0 mLs Intravenous Stopped 11/20/21 0844)     ED Course/ Medical Decision Making/ A&P Clinical Course as of 11/20/21 0945  Sat Nov 20, 2021  0823 CBC reviewed interpreted and anemia with hemoglobin of 8.3 is noted.  This is stable from yesterday.  However, is trending down from baseline of 9 [DR]  6606 Complete metabolic panel reviewed and interpreted and significant for elevated creatinine consistent with patient's known end-stage renal disease Mild hyponatremia Bilirubin elevated at 1.6 [DR]  0942 CT head and neck reimaged with CT scan and no evidence of acute intracranial or cervical spine injury noted [DR]    Clinical Course User Index [DR] Pattricia Boss, MD                           Medical Decision Making 81 year old male end-stage renal disease on dialysis who had mechanical fall yesterday.  He then received dialysis in the ED and had repair of his laceration.  He was then discharged home.  After arriving home he became lightheaded and had a syncopal episode with blood pressure decreasing to 70. Patient received 250 cc of fluid in route to the hospital and  was normotensive.  He was reevaluated here with labs, EKG, troponin and orthostatic vital signs.  After initial 500 cc of fluid his orthostasis was measured interpreted and had resolved. Patient anemia appears stable He was recently admitted for hyponatremia sodium today is 134 and has appeared stable EKG without any evidence of acute ischemia and normal troponin Patient appears stable for discharge home.  Amount and/or Complexity of Data Reviewed Labs: ordered. Radiology: ordered.           Final Clinical Impression(s) / ED Diagnoses Final diagnoses:  Syncope and collapse  Orthostatic hypotension  Dialysis complication, initial encounter    Rx / DC Orders ED Discharge Orders     None         Pattricia Boss, MD 11/20/21 0945

## 2021-11-20 NOTE — Progress Notes (Signed)
Received patient in stretcher from ED, alert and oriented. Informed consent signed and in chart.  Time tx completed:0003H  HD treatment completed. Patient fairly tolerated tx. Fistula without signs and symptoms of complications. Patient transported back to ED  alert and oriented and in no acute distress. Report given to ED RN.  Total UF removed:1746m  Medication given: Oxycodone '5mg'$ (Refer MAR)  Post HD VS: BP 102/551mg, HR 66bpm, Sats 100% RA, RR 21 Brpm, 3/10 pain T [id number] HD weight: 87.5kg

## 2021-11-20 NOTE — Discharge Instructions (Signed)
Your fall today was thought to be due to your blood pressure decreasing. This is likely secondary to your recent dialysis. Please change positions slowly and drink fluids as allowed Return if you are having any new or worsening symptoms

## 2021-11-20 NOTE — ED Notes (Signed)
Patient transported to CT 

## 2021-11-20 NOTE — ED Provider Notes (Signed)
Patient has completed dialysis, is resting comfortably with normal vital signs.  He is discharged with instructions to have staples removed in 7-10 days.  He is to follow-up with his routine hemodialysis.   Delora Fuel, MD 57/02/20 [phone]

## 2021-11-22 DIAGNOSIS — N186 End stage renal disease: Secondary | ICD-10-CM | POA: Diagnosis not present

## 2021-11-22 DIAGNOSIS — D631 Anemia in chronic kidney disease: Secondary | ICD-10-CM | POA: Diagnosis not present

## 2021-11-22 DIAGNOSIS — N2581 Secondary hyperparathyroidism of renal origin: Secondary | ICD-10-CM | POA: Diagnosis not present

## 2021-11-22 DIAGNOSIS — Z992 Dependence on renal dialysis: Secondary | ICD-10-CM | POA: Diagnosis not present

## 2021-11-22 DIAGNOSIS — D509 Iron deficiency anemia, unspecified: Secondary | ICD-10-CM | POA: Diagnosis not present

## 2021-11-23 ENCOUNTER — Inpatient Hospital Stay (HOSPITAL_COMMUNITY)
Admission: EM | Admit: 2021-11-23 | Discharge: 2021-11-26 | DRG: 871 | Disposition: A | Discharge status: Home Health | Payer: Medicare Other | Attending: Internal Medicine | Admitting: Internal Medicine

## 2021-11-23 ENCOUNTER — Other Ambulatory Visit: Payer: Self-pay

## 2021-11-23 ENCOUNTER — Emergency Department (HOSPITAL_COMMUNITY): Payer: Medicare Other

## 2021-11-23 ENCOUNTER — Ambulatory Visit (INDEPENDENT_AMBULATORY_CARE_PROVIDER_SITE_OTHER): Payer: Medicare Other | Admitting: Physician Assistant

## 2021-11-23 ENCOUNTER — Encounter: Payer: Self-pay | Admitting: Physician Assistant

## 2021-11-23 ENCOUNTER — Encounter (HOSPITAL_COMMUNITY): Payer: Self-pay

## 2021-11-23 DIAGNOSIS — E871 Hypo-osmolality and hyponatremia: Secondary | ICD-10-CM | POA: Diagnosis present

## 2021-11-23 DIAGNOSIS — Z89412 Acquired absence of left great toe: Secondary | ICD-10-CM

## 2021-11-23 DIAGNOSIS — E872 Acidosis, unspecified: Secondary | ICD-10-CM | POA: Diagnosis not present

## 2021-11-23 DIAGNOSIS — Z79899 Other long term (current) drug therapy: Secondary | ICD-10-CM

## 2021-11-23 DIAGNOSIS — R42 Dizziness and giddiness: Secondary | ICD-10-CM | POA: Diagnosis not present

## 2021-11-23 DIAGNOSIS — N390 Urinary tract infection, site not specified: Secondary | ICD-10-CM | POA: Diagnosis not present

## 2021-11-23 DIAGNOSIS — I959 Hypotension, unspecified: Secondary | ICD-10-CM | POA: Diagnosis not present

## 2021-11-23 DIAGNOSIS — D61818 Other pancytopenia: Secondary | ICD-10-CM | POA: Diagnosis present

## 2021-11-23 DIAGNOSIS — Z823 Family history of stroke: Secondary | ICD-10-CM

## 2021-11-23 DIAGNOSIS — R0682 Tachypnea, not elsewhere classified: Secondary | ICD-10-CM | POA: Diagnosis present

## 2021-11-23 DIAGNOSIS — R296 Repeated falls: Secondary | ICD-10-CM | POA: Diagnosis present

## 2021-11-23 DIAGNOSIS — I132 Hypertensive heart and chronic kidney disease with heart failure and with stage 5 chronic kidney disease, or end stage renal disease: Secondary | ICD-10-CM | POA: Diagnosis present

## 2021-11-23 DIAGNOSIS — K219 Gastro-esophageal reflux disease without esophagitis: Secondary | ICD-10-CM | POA: Diagnosis present

## 2021-11-23 DIAGNOSIS — G2581 Restless legs syndrome: Secondary | ICD-10-CM | POA: Diagnosis present

## 2021-11-23 DIAGNOSIS — L97521 Non-pressure chronic ulcer of other part of left foot limited to breakdown of skin: Secondary | ICD-10-CM | POA: Diagnosis not present

## 2021-11-23 DIAGNOSIS — I480 Paroxysmal atrial fibrillation: Secondary | ICD-10-CM | POA: Diagnosis not present

## 2021-11-23 DIAGNOSIS — R6520 Severe sepsis without septic shock: Secondary | ICD-10-CM | POA: Diagnosis present

## 2021-11-23 DIAGNOSIS — Z992 Dependence on renal dialysis: Secondary | ICD-10-CM

## 2021-11-23 DIAGNOSIS — I2510 Atherosclerotic heart disease of native coronary artery without angina pectoris: Secondary | ICD-10-CM | POA: Diagnosis present

## 2021-11-23 DIAGNOSIS — N3000 Acute cystitis without hematuria: Secondary | ICD-10-CM | POA: Diagnosis not present

## 2021-11-23 DIAGNOSIS — D649 Anemia, unspecified: Secondary | ICD-10-CM

## 2021-11-23 DIAGNOSIS — A419 Sepsis, unspecified organism: Principal | ICD-10-CM | POA: Diagnosis present

## 2021-11-23 DIAGNOSIS — F32A Depression, unspecified: Secondary | ICD-10-CM | POA: Diagnosis present

## 2021-11-23 DIAGNOSIS — I5032 Chronic diastolic (congestive) heart failure: Secondary | ICD-10-CM | POA: Diagnosis not present

## 2021-11-23 DIAGNOSIS — B952 Enterococcus as the cause of diseases classified elsewhere: Secondary | ICD-10-CM | POA: Diagnosis present

## 2021-11-23 DIAGNOSIS — Z87891 Personal history of nicotine dependence: Secondary | ICD-10-CM

## 2021-11-23 DIAGNOSIS — R55 Syncope and collapse: Secondary | ICD-10-CM | POA: Diagnosis not present

## 2021-11-23 DIAGNOSIS — R627 Adult failure to thrive: Secondary | ICD-10-CM | POA: Diagnosis present

## 2021-11-23 DIAGNOSIS — Z8679 Personal history of other diseases of the circulatory system: Secondary | ICD-10-CM | POA: Diagnosis not present

## 2021-11-23 DIAGNOSIS — Z66 Do not resuscitate: Secondary | ICD-10-CM | POA: Diagnosis present

## 2021-11-23 DIAGNOSIS — R531 Weakness: Secondary | ICD-10-CM | POA: Diagnosis not present

## 2021-11-23 DIAGNOSIS — G4733 Obstructive sleep apnea (adult) (pediatric): Secondary | ICD-10-CM | POA: Diagnosis present

## 2021-11-23 DIAGNOSIS — F1021 Alcohol dependence, in remission: Secondary | ICD-10-CM | POA: Diagnosis present

## 2021-11-23 DIAGNOSIS — K7030 Alcoholic cirrhosis of liver without ascites: Secondary | ICD-10-CM | POA: Diagnosis present

## 2021-11-23 DIAGNOSIS — D631 Anemia in chronic kidney disease: Secondary | ICD-10-CM | POA: Diagnosis present

## 2021-11-23 DIAGNOSIS — Z86010 Personal history of colonic polyps: Secondary | ICD-10-CM

## 2021-11-23 DIAGNOSIS — K760 Fatty (change of) liver, not elsewhere classified: Secondary | ICD-10-CM | POA: Diagnosis present

## 2021-11-23 DIAGNOSIS — Z6825 Body mass index (BMI) 25.0-25.9, adult: Secondary | ICD-10-CM

## 2021-11-23 DIAGNOSIS — N186 End stage renal disease: Secondary | ICD-10-CM | POA: Diagnosis present

## 2021-11-23 DIAGNOSIS — I4819 Other persistent atrial fibrillation: Secondary | ICD-10-CM | POA: Diagnosis present

## 2021-11-23 DIAGNOSIS — R001 Bradycardia, unspecified: Secondary | ICD-10-CM | POA: Diagnosis not present

## 2021-11-23 DIAGNOSIS — Z8674 Personal history of sudden cardiac arrest: Secondary | ICD-10-CM

## 2021-11-23 DIAGNOSIS — Z8249 Family history of ischemic heart disease and other diseases of the circulatory system: Secondary | ICD-10-CM

## 2021-11-23 DIAGNOSIS — N2581 Secondary hyperparathyroidism of renal origin: Secondary | ICD-10-CM | POA: Diagnosis present

## 2021-11-23 DIAGNOSIS — Z880 Allergy status to penicillin: Secondary | ICD-10-CM

## 2021-11-23 DIAGNOSIS — F5101 Primary insomnia: Secondary | ICD-10-CM

## 2021-11-23 DIAGNOSIS — E785 Hyperlipidemia, unspecified: Secondary | ICD-10-CM | POA: Diagnosis present

## 2021-11-23 DIAGNOSIS — Z8261 Family history of arthritis: Secondary | ICD-10-CM

## 2021-11-23 HISTORY — DX: Restless legs syndrome: G25.81

## 2021-11-23 LAB — CBC WITH DIFFERENTIAL/PLATELET
Abs Immature Granulocytes: 0.01 10*3/uL (ref 0.00–0.07)
Basophils Absolute: 0 10*3/uL (ref 0.0–0.1)
Basophils Relative: 1 %
Eosinophils Absolute: 0 10*3/uL (ref 0.0–0.5)
Eosinophils Relative: 1 %
HCT: 23.4 % — ABNORMAL LOW (ref 39.0–52.0)
Hemoglobin: 7.7 g/dL — ABNORMAL LOW (ref 13.0–17.0)
Immature Granulocytes: 0 %
Lymphocytes Relative: 35 %
Lymphs Abs: 1.2 10*3/uL (ref 0.7–4.0)
MCH: 34.8 pg — ABNORMAL HIGH (ref 26.0–34.0)
MCHC: 32.9 g/dL (ref 30.0–36.0)
MCV: 105.9 fL — ABNORMAL HIGH (ref 80.0–100.0)
Monocytes Absolute: 0.5 10*3/uL (ref 0.1–1.0)
Monocytes Relative: 15 %
Neutro Abs: 1.7 10*3/uL (ref 1.7–7.7)
Neutrophils Relative %: 48 %
Platelets: 125 10*3/uL — ABNORMAL LOW (ref 150–400)
RBC: 2.21 MIL/uL — ABNORMAL LOW (ref 4.22–5.81)
RDW: 16.9 % — ABNORMAL HIGH (ref 11.5–15.5)
WBC: 3.5 10*3/uL — ABNORMAL LOW (ref 4.0–10.5)
nRBC: 0 % (ref 0.0–0.2)

## 2021-11-23 LAB — URINALYSIS, ROUTINE W REFLEX MICROSCOPIC
Bilirubin Urine: NEGATIVE
Glucose, UA: NEGATIVE mg/dL
Ketones, ur: NEGATIVE mg/dL
Nitrite: NEGATIVE
Protein, ur: NEGATIVE mg/dL
Specific Gravity, Urine: 1.004 — ABNORMAL LOW (ref 1.005–1.030)
pH: 9 — ABNORMAL HIGH (ref 5.0–8.0)

## 2021-11-23 LAB — I-STAT CHEM 8, ED
BUN: 16 mg/dL (ref 8–23)
BUN: 17 mg/dL (ref 8–23)
Calcium, Ion: 1.07 mmol/L — ABNORMAL LOW (ref 1.15–1.40)
Calcium, Ion: 1.07 mmol/L — ABNORMAL LOW (ref 1.15–1.40)
Chloride: 93 mmol/L — ABNORMAL LOW (ref 98–111)
Chloride: 93 mmol/L — ABNORMAL LOW (ref 98–111)
Creatinine, Ser: 2.9 mg/dL — ABNORMAL HIGH (ref 0.61–1.24)
Creatinine, Ser: 2.9 mg/dL — ABNORMAL HIGH (ref 0.61–1.24)
Glucose, Bld: 74 mg/dL (ref 70–99)
Glucose, Bld: 75 mg/dL (ref 70–99)
HCT: 21 % — ABNORMAL LOW (ref 39.0–52.0)
HCT: 22 % — ABNORMAL LOW (ref 39.0–52.0)
Hemoglobin: 7.1 g/dL — ABNORMAL LOW (ref 13.0–17.0)
Hemoglobin: 7.5 g/dL — ABNORMAL LOW (ref 13.0–17.0)
Potassium: 4.2 mmol/L (ref 3.5–5.1)
Potassium: 4.2 mmol/L (ref 3.5–5.1)
Sodium: 130 mmol/L — ABNORMAL LOW (ref 135–145)
Sodium: 130 mmol/L — ABNORMAL LOW (ref 135–145)
TCO2: 24 mmol/L (ref 22–32)
TCO2: 24 mmol/L (ref 22–32)

## 2021-11-23 LAB — COMPREHENSIVE METABOLIC PANEL
ALT: 20 U/L (ref 0–44)
AST: 33 U/L (ref 15–41)
Albumin: 3.3 g/dL — ABNORMAL LOW (ref 3.5–5.0)
Alkaline Phosphatase: 126 U/L (ref 38–126)
Anion gap: 14 (ref 5–15)
BUN: 18 mg/dL (ref 8–23)
CO2: 23 mmol/L (ref 22–32)
Calcium: 8.5 mg/dL — ABNORMAL LOW (ref 8.9–10.3)
Chloride: 94 mmol/L — ABNORMAL LOW (ref 98–111)
Creatinine, Ser: 2.83 mg/dL — ABNORMAL HIGH (ref 0.61–1.24)
GFR, Estimated: 22 mL/min — ABNORMAL LOW (ref >60)
Glucose, Bld: 72 mg/dL (ref 70–99)
Potassium: 4.3 mmol/L (ref 3.5–5.1)
Sodium: 131 mmol/L — ABNORMAL LOW (ref 135–145)
Total Bilirubin: 1.2 mg/dL (ref 0.3–1.2)
Total Protein: 5.6 g/dL — ABNORMAL LOW (ref 6.5–8.1)

## 2021-11-23 LAB — LACTIC ACID, PLASMA
Lactic Acid, Venous: 3 mmol/L (ref 0.5–1.9)
Lactic Acid, Venous: 2.8 mmol/L (ref 0.5–1.9)

## 2021-11-23 LAB — FOLATE: Folate: 21.1 ng/mL (ref >5.9)

## 2021-11-23 LAB — TSH: TSH: 2.411 u[IU]/mL (ref 0.350–4.500)

## 2021-11-23 LAB — POC OCCULT BLOOD, ED: Fecal Occult Bld: POSITIVE — AB

## 2021-11-23 LAB — PROTIME-INR
INR: 1.1 (ref 0.8–1.2)
Prothrombin Time: 13.9 seconds (ref 11.4–15.2)

## 2021-11-23 LAB — TROPONIN I (HIGH SENSITIVITY)
Troponin I (High Sensitivity): 6 ng/L (ref <18)
Troponin I (High Sensitivity): 6 ng/L (ref <18)

## 2021-11-23 LAB — MAGNESIUM: Magnesium: 1.9 mg/dL (ref 1.7–2.4)

## 2021-11-23 LAB — VITAMIN B12: Vitamin B-12: 633 pg/mL (ref 180–914)

## 2021-11-23 MED ORDER — SODIUM CHLORIDE 0.9 % IV SOLN
1.0000 g | Freq: Once | INTRAVENOUS | Status: AC
  Administered 2021-11-23: 1 g via INTRAVENOUS
  Filled 2021-11-23: qty 10

## 2021-11-23 MED ORDER — LACTATED RINGERS IV BOLUS
250.0000 mL | Freq: Once | INTRAVENOUS | Status: AC
  Administered 2021-11-23: 250 mL via INTRAVENOUS

## 2021-11-23 MED ORDER — LACTATED RINGERS IV SOLN: INTRAVENOUS | Status: DC

## 2021-11-23 MED ORDER — LACTATED RINGERS IV BOLUS
500.0000 mL | Freq: Once | INTRAVENOUS | Status: AC
  Administered 2021-11-23: 500 mL via INTRAVENOUS

## 2021-11-23 NOTE — Progress Notes (Signed)
Office Visit Note   Patient: Darryl Diaz           Date of Birth: 15-Aug-1940           MRN: [id number] Visit Date: 11/23/2021              Requested by: [name], MD [address] [place],  [place] [address] PCP: [name], MD  Follow-up ulcer left fourth toe    HPI: Patient is a pleasant 81 year old gentleman who is a patient of Dr. Jess Barters.  He is here in follow-up for an ulcer that was on the distal tip of his left fourth toe.  He was on antibiotics.  He is also would like to have his right great toe and second toe checked.  They seem to be rubbing together and he is concerned that he might develop an ulcer there as well.  He did take antibiotics and states he has no pain and is feeling much better  Assessment & Plan: Visit Diagnoses: No diagnosis found.  Plan: Ulcer at the tip of the left fourth toe has completely healed.  There was a thin callus that revealed healthy skin beneath.  He has no sign of infection no redness no ascending cellulitis.  With regards to the right foot he does have some pressure from a fixed hammertoe deformity against his great toe.  I did give him some different spacers to try to relieve the pressure.  Again there was no sign of any ulcer.  His foot did not have any redness no ascending cellulitis.  We discussed checking his feet daily if he can.  If he has any pain or concerns he should follow-up with Dr. Sharol Given immediately  Follow-Up Instructions: No follow-ups on file.   Ortho Exam  Patient is alert, oriented, no adenopathy, well-dressed, normal affect, normal respiratory effort. Examination of the right foot his foot is warm palpable pulse he does have some contact pressure between a fixed deformity of the second toe and the great toe but mild erythema from the contact pressure but no skin breakdown.  The toes are warm.  No findings for infection. 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   Fourth toe has no redness no swelling no ascending cellulitis again he has a palpable pulse.  He has a very thin callus which when removed reveals healthy skin  Imaging: No results found. No images are attached to the encounter.  Labs: Lab Results  Component Value Date   HGBA1C 4.4 (L) 09/07/2020   HGBA1C 4.7 (L) 02/28/2019   ESRSEDRATE 25 (H) 06/07/2021   CRP 3.2 (H) 06/10/2021   CRP 4.1 (H) 06/07/2021   LABURIC 5.5 09/28/2021   REPTSTATUS 06/13/2021 FINAL 06/08/2021   GRAMSTAIN  01/20/2021    RARE WBC PRESENT,BOTH PMN AND MONONUCLEAR RARE GRAM POSITIVE COCCI IN PAIRS Performed at [facility], [place] [address] [place], [place] [address]    CULT  06/08/2021    NO GROWTH 5 DAYS Performed at [facility] [address] [place], [place] [address]    LABORGA STAPHYLOCOCCUS AUREUS 01/20/2021     Lab Results  Component Value Date   ALBUMIN 3.1 (L) 11/20/2021   ALBUMIN 3.2 (L) 11/08/2021   ALBUMIN 2.8 (L) 09/30/2021    Lab Results  Component Value Date   MG 1.7 09/30/2021   MG 1.7 09/29/2021   MG 1.8 09/28/2021   No results found for: "VD25OH"  No results found for: "PREALBUMIN"    Latest Ref Rng & Units 11/20/2021    7:26 AM 11/19/2021   12:36 PM 11/08/2021   11:56 AM  CBC EXTENDED  WBC 4.0 - 10.5 K/uL 3.5  3.6  4.6   RBC 4.22 - 5.81 MIL/uL 2.45  2.41  3.11   Hemoglobin 13.0 - 17.0 g/dL 8.3  8.4  10.4   HCT 39.0 - 52.0 % 25.2  24.0  29.6   Platelets 150 - 400 K/uL 121  129  152      There is no height or weight on file to calculate BMI.  Orders:  No orders of the defined types were placed in this encounter.  No orders of the defined types were placed in this encounter.    Procedures: No procedures performed  Clinical Data: No additional findings.  ROS:  All other systems negative, except as noted in the HPI. Review of Systems  Objective: Vital Signs: There were no vitals taken for this visit.  Specialty Comments:  No specialty  comments available.  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   Cellulitis of right foot 01/17/2021   Normocytic anemia 01/17/2021   Acute lower UTI 01/17/2021   Acute GI bleeding 01/06/2021   Symptomatic anemia 12/02/2020   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   Orthostatic hypotension 11/23/2020   Acute renal insufficiency 10/23/2020   Alcohol abuse 10/23/2020   Hypokalemia 10/23/2020   Hypomagnesemia 10/23/2020   Low back pain at multiple sites 10/23/2020   Malnutrition of mild degree Altamease Oiler: 75% to less than 90% of standard weight) ([place]) 10/23/2020   Open wound of scalp without complication 65/78/4696   Oral candidiasis 10/23/2020   Cellulitis of left foot    Cellulitis of toe of left foot 10/13/2020   Skin ulcer of left great toe ([place]) 10/13/2020   CKD (chronic kidney disease) stage 4, 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/13/2020   Chronic heart failure with preserved ejection fraction (HFpEF) ([place]) 10/13/2020   Transient weakness of left lower extremity 09/07/2020   Heme positive stool 05/14/2020   Abnormality of gait 04/27/2020   Salmonella food poisoning 29/52/8413   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[place]) 03/17/2020   Yeast infection 03/16/2020   Diarrhea 03/16/2020   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 (HCC)    Allergies    Anemia, chronic disease    History of hypertension    Debility    Syncope and collapse 02/22/2020   AKI (acute kidney injury) ([place])  02/10/2020   Unspecified protein-calorie malnutrition ([place]) 02/10/2020   Weakness 02/06/2020   History of repair of hip joint 10/15/2019   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   Pain in right foot 06/27/2019   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   Bradycardia 04/20/2019   Gastrointestinal hemorrhage 02/25/2019   Fall at home, initial encounter 02/25/2019   Bilateral lower extremity edema 02/25/2019   Hyponatremia 02/25/2019   Fall 02/25/2019   Benign prostatic hyperplasia with lower urinary tract symptoms 12/19/2018   Iron  deficiency anemia due to chronic blood loss 08/11/2018   Anemia 08/11/2018   Hardening of the aorta (main artery of the heart) ([place]) 08/11/2018   Benign neoplasm of colon 06/18/2018   History of adenomatous polyp of colon 08/10/2017   Impaired fasting glucose 06/10/2016   Encounter for general adult medical examination without abnormal findings 06/03/2016   Posterior dislocation of hip, closed ([place]) 04/30/2016   Hip fracture ([place]) 03/30/2016   Hypertension 03/30/2016   Hyperlipidemia 03/30/2016   Osteoarthritis 03/30/2016   Depressive disorder 03/30/2016   Rhabdomyolysis 03/30/2016   Leukocytosis 03/30/2016   Pressure ulcer 03/30/2016   Fracture of hip ([place]) 03/30/2016   Gastroesophageal reflux disease 12/17/2015   Paroxysmal atrial fibrillation ([place]) 12/17/2015   Primary hypertension 12/17/2015   Long term (current) use of anticoagulants 12/17/2015   Moderate major depression, single episode ([place]) 12/17/2015   Pure hypercholesterolemia 12/17/2015   Seasonal allergic rhinitis 12/17/2015   CAD (coronary artery disease) 08/21/2015   Past Medical History:  Diagnosis Date   Alcohol abuse 38/02/1750   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[place]) 03/17/2020   Alcoholism ([place])    in remission following wife's death   Anemia    Anxiety    Ascending aorta dilation (HCC)    Atrial fibrillation (HCC)    Cardiac arrest ([place]) 04/20/2019   12 min CPR  with epi   Chronic diastolic CHF (congestive heart failure) (HCC)    Dysrhythmia    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[place])    Mon Wed Fri   Fatty liver    Gastritis    GERD (gastroesophageal reflux disease)    H/O seasonal allergies    History of blood transfusion    History of GI bleed    Hx of adenomatous colonic polyps 08/10/2017   Hyperlipidemia    Hypertension    Insomnia    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   Mallory-Weiss tear    OA (osteoarthritis)    hands   OSA (obstructive sleep apnea)    No longer uses CPAP   Persistent atrial fibrillation with rapid ventricular response ([place]) 02/05/2020   Recurrent syncope    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([place])     Family History  Problem Relation Age of Onset   Heart disease Mother 9   Hypertension Mother    Arthritis Mother    Heart failure Father 34   Stroke Maternal Aunt    Heart failure Sister    Colon cancer Neg Hx    Esophageal cancer Neg Hx    Stomach cancer Neg Hx    Rectal cancer Neg Hx     Past Surgical History:  Procedure Laterality Date   AMPUTATION Left 05/19/2021   Procedure: LEFT GREAT TOE AMPUTATION;  Surgeon: Newt Minion, MD;  Location: [facility];  Service: Orthopedics;  Laterality: Left;   ANKLE SURGERY Left 2014   had rods put in    AV FISTULA PLACEMENT Left 04/21/2021   Procedure: LEFT ARM ARTERIOVENOUS (AV) FISTULA CREATION - Left brachiocephalic fistula;  Surgeon: Marty Heck, MD;  Location: [facility];  Service: Vascular;  Laterality: Left;   BIOPSY  02/28/2019   Procedure: BIOPSY;  Surgeon: Milus Banister, MD;  Location: [facility];  Service: Endoscopy;;   CARDIOVERSION  02/06/2020   CARDIOVERSION N/A 02/06/2020   Procedure: CARDIOVERSION;  Surgeon: Werner Lean, MD;  Location: [facility];  Service: Cardiovascular;  Laterality: N/A;   CARDIOVERSION N/A 02/24/2020   Procedure: CARDIOVERSION;  Surgeon: Werner Lean, MD;  Location: [facility];  Service:  Cardiovascular;  Laterality: N/A;   COLONOSCOPY     [place]   COLONOSCOPY WITH PROPOFOL N/A 01/08/2021   Procedure: COLONOSCOPY WITH PROPOFOL;  Surgeon: Milus Banister, MD;  [facility];  Service: Endoscopy;  Laterality: N/A;   ESOPHAGOGASTRODUODENOSCOPY (EGD) WITH PROPOFOL N/A 02/28/2019   Procedure: ESOPHAGOGASTRODUODENOSCOPY (EGD) WITH PROPOFOL;  Surgeon: Milus Banister, MD;  Location[facility] ENDOSCOPY;  Service: Endoscopy;  Laterality: N/A;   FRACTURE SURGERY     HIP ARTHROPLASTY Left 04/01/2016   Procedure: ARTHROPLASTY BIPOLAR HIP (HEMIARTHROPLASTY);  Surgeon: Paralee Cancel, MD;  Location: [facility];  Service: Orthopedics;  Laterality: Left;   HIP CLOSED REDUCTION Left 04/30/2016   Procedure: CLOSED REDUCTION HIP;  Surgeon: Wylene Simmer, MD;  Location: [facility];  Service: Orthopedics;  Laterality: Left;   HOT HEMOSTASIS N/A 01/08/2021   Procedure: HOT HEMOSTASIS (ARGON PLASMA COAGULATION/BICAP);  Surgeon: Milus Banister, MD;  Location: [facility] ENDOSCOPY;  Service: Endoscopy;  Laterality: N/A;   IR RADIOLOGIST EVAL & MGMT  12/31/2020   JOINT REPLACEMENT     TONSILLECTOMY     TOTAL HIP ARTHROPLASTY Right 10/15/2019   Procedure: TOTAL HIP ARTHROPLASTY ANTERIOR APPROACH;  Surgeon: Paralee Cancel, MD;  Location: [facility];  Service: Orthopedics;  Laterality: Right;  70 mins   VASCULAR SURGERY     Social History   Occupational History   Occupation: retired  Tobacco Use   Smoking status: Former    Packs/day: 1.00    Years: 20.00    Total pack years: 20.00    Types: Cigarettes   Smokeless tobacco: Never   Tobacco comments:       quit 20 years  smoked on and off for 20 years  Vaping Use   Vaping Use: Never used  Substance and Sexual Activity   Alcohol use: Not Currently    Alcohol/week: 3.0 standard drinks of alcohol    Types: 3 Glasses of wine per week    Comment: h/o heavy use   Drug use: Never   Sexual activity: Not Currently

## 2021-11-23 NOTE — ED Provider Notes (Signed)
[facility]ERGENCY DEPARTMENT Provider Note   CSN: [id number] Arrival date & time: 11/23/21  1905     History {Add pertinent medical, surgical, social history, OB history to HPI:1} Chief Complaint  Patient presents with   Weakness    Darryl Diaz is a 81 y.o. male.   Weakness Patient presents for generalized weakness.  Medical history includes HTN, HLD, GERD, arthritis, depression, paroxysmal A-fib, anemia, ESRD (on HD M, W, F), CHF, CAD.  He states that he underwent dialysis yesterday.  He felt very mild generalized weakness last night.  This morning, he felt fatigue and generalized weakness and that caused him to lay in bed throughout the day.  He was able to get out of bed but was unable to do any activities such as making his dinner.  Patient currently lives alone.  In addition to his generalized weakness, patient also endorses some near syncopal symptoms with standing and ambulating.  Currently, he denies any areas of discomfort.  He continues to feel fatigue and generalized weakness.     Home Medications Prior to Admission medications   Medication Sig Start Date End Date Taking? Authorizing Provider  acetaminophen (TYLENOL) 325 MG tablet Take 2 tablets (650 mg total) by mouth every 4 (four) hours as needed for headache or mild pain. Patient taking differently: Take 325-650 mg by mouth every 4 (four) hours as needed for headache. 03/10/20   Angiulli, Lavon Paganini, PA-C  albuterol (PROVENTIL) (2.5 MG/3ML) 0.083% nebulizer solution Take 3 mLs (2.5 mg total) by nebulization every 6 (six) hours as needed for wheezing or shortness of breath. Patient not taking: Reported on 09/29/2021 06/14/21   Bonnielee Haff, MD  amiodarone (PACERONE) 200 MG tablet TAKE 1 TABLET BY MOUTH ONCE DAILY Patient taking differently: Take 200 mg by mouth every morning. 05/17/21   Richardson Dopp T, PA-C  atorvastatin (LIPITOR) 20 MG tablet Take 1 tablet (20 mg total) by mouth daily. Patient  not taking: Reported on 11/23/2021 03/10/20   Angiulli, Lavon Paganini, PA-C  carvedilol (COREG) 6.25 MG tablet Take 1 tablet (6.25 mg total) by mouth 2 (two) times daily. 02/11/21   Sherren Mocha, MD  cetirizine (ZYRTEC) 10 MG tablet Take 10 mg by mouth daily. Patient not taking: Reported on 06/08/2021    [provider]  escitalopram (LEXAPRO) 10 MG tablet Take 1 tablet (10 mg total) by mouth daily. Patient taking differently: Take 10 mg by mouth every morning. 03/10/20   Angiulli, Lavon Paganini, PA-C  ferrous sulfate 324 MG TBEC Take 324 mg by mouth every morning.    [provider]  FOLIC ACID PO Take 1 tablet by mouth every morning.    [provider]  Magnesium Oxide 400 MG CAPS Take 1 capsule (400 mg total) by mouth daily. 10/17/20   Eugenie Filler, MD  Multiple Vitamins-Minerals (CENTRUM SILVER 50+MEN) TABS Take 1 tablet by mouth every other day.    [provider]  nitroGLYCERIN (NITROSTAT) 0.4 MG SL tablet Place 1 tablet (0.4 mg total) under the tongue every 5 (five) minutes x 3 doses as needed for chest pain. 07/28/20   Baldwin Jamaica, PA-C  pantoprazole (PROTONIX) 40 MG tablet Take 1 tablet (40 mg total) by mouth daily. Patient taking differently: Take 40 mg by mouth every morning. 03/10/20   Angiulli, Lavon Paganini, PA-C  traZODone (DESYREL) 50 MG tablet Take 1 tablet (50 mg total) by mouth at bedtime. Patient taking differently: Take 50 mg by mouth at bedtime  as needed for sleep. 03/10/20   Angiulli, Lavon Paganini, PA-C  vitamin B-12 (CYANOCOBALAMIN) 500 MCG tablet Take 1 tablet (500 mcg total) by mouth daily. Patient not taking: Reported on 09/29/2021 06/11/21   Bonnielee Haff, MD      Allergies    Penicillin g sodium and Penicillins    Review of Systems   Review of Systems  Constitutional:  Positive for fatigue.  Neurological:  Positive for weakness (Generalized) and light-headedness.  All other systems reviewed and are negative.   Physical Exam Updated  Vital Signs BP (!) 151/66 (BP Location: Right Arm)   Pulse 62   Temp 98.1 F (36.7 C) (Oral)   Resp (!) 21   Ht 6' (1.829 m)   Wt 87.5 kg   SpO2 98%   BMI 26.16 kg/m  Physical Exam Vitals and nursing note reviewed.  Constitutional:      General: He is not in acute distress.    Appearance: He is well-developed. He is ill-appearing (Chronically). He is not toxic-appearing or diaphoretic.  HENT:     Head: Normocephalic and atraumatic.     Right Ear: External ear normal.     Left Ear: External ear normal.     Nose: Nose normal.     Mouth/Throat:     Mouth: Mucous membranes are moist.     Pharynx: Oropharynx is clear.  Eyes:     Extraocular Movements: Extraocular movements intact.     Conjunctiva/sclera: Conjunctivae normal.  Cardiovascular:     Rate and Rhythm: Normal rate and regular rhythm.     Heart sounds: No murmur heard. Pulmonary:     Effort: Pulmonary effort is normal. No respiratory distress.     Breath sounds: Normal breath sounds. No wheezing or rales.  Chest:     Chest wall: No tenderness.  Abdominal:     Palpations: Abdomen is soft.     Tenderness: There is no abdominal tenderness.  Musculoskeletal:        General: No swelling. Normal range of motion.     Cervical back: Normal range of motion and neck supple.     Right lower leg: Edema present.     Left lower leg: Edema present.  Skin:    General: Skin is warm and dry.     Coloration: Skin is pale. Skin is not jaundiced.  Neurological:     General: No focal deficit present.     Mental Status: He is alert and oriented to person, place, and time.     Cranial Nerves: No cranial nerve deficit.     Sensory: No sensory deficit.     Motor: No weakness.     Coordination: Coordination normal.  Psychiatric:        Mood and Affect: Mood normal.        Behavior: Behavior normal.        Thought Content: Thought content normal.        Judgment: Judgment normal.     ED Results / Procedures / Treatments    Labs (all labs ordered are listed, but only abnormal results are displayed) Labs Reviewed - No data to display  EKG None  Radiology No results found.  Procedures Procedures  {Document cardiac monitor, telemetry assessment procedure when appropriate:1}  Medications Ordered in ED Medications - No data to display  ED Course/ Medical Decision Making/ A&P  Medical Decision Making Amount and/or Complexity of Data Reviewed Labs: ordered. Radiology: ordered. ECG/medicine tests: ordered.   This patient presents to the ED for concern of generalized weakness, this involves an extensive number of treatment options, and is a complaint that carries with it a high risk of complications and morbidity.  The differential diagnosis includes dehydration, deconditioning, infection, metabolic derangements, polypharmacy, anemia   Co morbidities that complicate the patient evaluation  HTN, HLD, GERD, arthritis, depression, paroxysmal A-fib, anemia, ESRD (on HD M, W, F), CHF, CAD   Additional history obtained:  Additional history obtained from N/A External records from outside source obtained and reviewed including EMR   Lab Tests:  I Ordered, and personally interpreted labs.  The pertinent results include: Baseline leukopenia, continued downtrend in hemoglobin, continued uptrend in MCV, baseline CKD, slight hyponatremia, low-normal glucose, normal troponin, lactic acidosis of 3.0   Imaging Studies ordered:  I ordered imaging studies including chest x-ray I independently visualized and interpreted imaging which showed no acute findings I agree with the radiologist interpretation   Cardiac Monitoring: / EKG:  The patient was maintained on a cardiac monitor.  I personally viewed and interpreted the cardiac monitored which showed an underlying rhythm of: Sinus rhythm    Consultations Obtained:  I requested consultation with the ***,  and discussed lab and  imaging findings as well as pertinent plan - they recommend: ***   Problem List / ED Course / Critical interventions / Medication management  Patient is a 81 year old male presenting from home for generalized weakness.  Although he felt slight fatigue last night, symptoms generally started this morning and persisted throughout the day.  Vital signs are normal on arrival in the ED.  Patient is alert and oriented.  He currently denies any areas of discomfort but does endorse continued fatigue and generalized weakness.  He has no focal deficits on exam.  Laboratory work-up was initiated.  Patient is a dialysis patient but does continue to make urine.  Given his very limited p.o. intake today, small bolus of IV fluids was ordered.  Patient's lab work shows a downtrending hemoglobin over the past several weeks.  Currently, it is 7.7.  2 weeks ago, it was 10.4.  He also has a persistent leukopenia and an increasing MCV.  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  Patient's lactic acid was elevated at 3.0.  This could be secondary to dehydration and additional IV fluids were ordered.***. I ordered medication including ***  for ***  Reevaluation of the patient after these medicines showed that the patient {resolved/improved/worsened:23923::"improved"} I have reviewed the patients home medicines and have made adjustments as needed   Social Determinants of Health:  ***   Test / Admission - Considered:  ***   {Document critical care time when appropriate:1} {Document review of labs and clinical decision tools ie heart score, Chads2Vasc2 etc:1}  {Document your independent review of radiology images, and any outside records:1} {Document your discussion with family members, caretakers, and with consultants:1} {Document social determinants of health affecting pt's care:1} {Document your decision making why or why not admission, treatments were  needed:1} Final Clinical Impression(s) / ED Diagnoses Final diagnoses:  None    Rx / DC Orders ED Discharge Orders     None

## 2021-11-23 NOTE — ED Triage Notes (Signed)
Pt from home via [facility], c/c weakness and feeling light headed and near syncope while eating dinner, pt was here on 7/15 for the same. Reports had dialysis yesterday as schedule. A&O x4, NAD noted. VSS, SB on EKG, rate 55, on beta-blocker.

## 2021-11-23 NOTE — ED Notes (Signed)
Pt was cleaned for soiled depends, pt cleansed and placed in a clean diaper and warm blankets given, pt reposition for comfort.

## 2021-11-24 ENCOUNTER — Encounter (HOSPITAL_COMMUNITY): Payer: Self-pay | Admitting: Internal Medicine

## 2021-11-24 DIAGNOSIS — N3000 Acute cystitis without hematuria: Secondary | ICD-10-CM | POA: Diagnosis not present

## 2021-11-24 DIAGNOSIS — R6520 Severe sepsis without septic shock: Secondary | ICD-10-CM | POA: Diagnosis present

## 2021-11-24 DIAGNOSIS — D649 Anemia, unspecified: Secondary | ICD-10-CM | POA: Diagnosis not present

## 2021-11-24 DIAGNOSIS — E872 Acidosis, unspecified: Secondary | ICD-10-CM | POA: Diagnosis not present

## 2021-11-24 DIAGNOSIS — N186 End stage renal disease: Secondary | ICD-10-CM | POA: Diagnosis not present

## 2021-11-24 DIAGNOSIS — R531 Weakness: Secondary | ICD-10-CM

## 2021-11-24 DIAGNOSIS — D61818 Other pancytopenia: Secondary | ICD-10-CM | POA: Diagnosis not present

## 2021-11-24 DIAGNOSIS — E871 Hypo-osmolality and hyponatremia: Secondary | ICD-10-CM | POA: Diagnosis not present

## 2021-11-24 DIAGNOSIS — A419 Sepsis, unspecified organism: Secondary | ICD-10-CM | POA: Diagnosis not present

## 2021-11-24 DIAGNOSIS — I5032 Chronic diastolic (congestive) heart failure: Secondary | ICD-10-CM | POA: Diagnosis not present

## 2021-11-24 DIAGNOSIS — I480 Paroxysmal atrial fibrillation: Secondary | ICD-10-CM | POA: Diagnosis not present

## 2021-11-24 LAB — HEPATITIS B SURFACE ANTIGEN: Hepatitis B Surface Ag: NONREACTIVE

## 2021-11-24 LAB — PHOSPHORUS: Phosphorus: 3.1 mg/dL (ref 2.5–4.6)

## 2021-11-24 LAB — CBC WITH DIFFERENTIAL/PLATELET
Abs Immature Granulocytes: 0.01 10*3/uL (ref 0.00–0.07)
Basophils Absolute: 0 10*3/uL (ref 0.0–0.1)
Basophils Relative: 1 %
Eosinophils Absolute: 0 10*3/uL (ref 0.0–0.5)
Eosinophils Relative: 0 %
HCT: 22.9 % — ABNORMAL LOW (ref 39.0–52.0)
Hemoglobin: 7.8 g/dL — ABNORMAL LOW (ref 13.0–17.0)
Immature Granulocytes: 0 %
Lymphocytes Relative: 24 %
Lymphs Abs: 1 10*3/uL (ref 0.7–4.0)
MCH: 35 pg — ABNORMAL HIGH (ref 26.0–34.0)
MCHC: 34.1 g/dL (ref 30.0–36.0)
MCV: 102.7 fL — ABNORMAL HIGH (ref 80.0–100.0)
Monocytes Absolute: 0.4 10*3/uL (ref 0.1–1.0)
Monocytes Relative: 11 %
Neutro Abs: 2.5 10*3/uL (ref 1.7–7.7)
Neutrophils Relative %: 64 %
Platelets: 129 10*3/uL — ABNORMAL LOW (ref 150–400)
RBC: 2.23 MIL/uL — ABNORMAL LOW (ref 4.22–5.81)
RDW: 17 % — ABNORMAL HIGH (ref 11.5–15.5)
WBC: 3.9 10*3/uL — ABNORMAL LOW (ref 4.0–10.5)
nRBC: 0 % (ref 0.0–0.2)

## 2021-11-24 LAB — COMPREHENSIVE METABOLIC PANEL
ALT: 18 U/L (ref 0–44)
AST: 28 U/L (ref 15–41)
Albumin: 2.8 g/dL — ABNORMAL LOW (ref 3.5–5.0)
Alkaline Phosphatase: 114 U/L (ref 38–126)
Anion gap: 11 (ref 5–15)
BUN: 20 mg/dL (ref 8–23)
CO2: 25 mmol/L (ref 22–32)
Calcium: 8.4 mg/dL — ABNORMAL LOW (ref 8.9–10.3)
Chloride: 98 mmol/L (ref 98–111)
Creatinine, Ser: 2.69 mg/dL — ABNORMAL HIGH (ref 0.61–1.24)
GFR, Estimated: 23 mL/min — ABNORMAL LOW (ref >60)
Glucose, Bld: 76 mg/dL (ref 70–99)
Potassium: 4.1 mmol/L (ref 3.5–5.1)
Sodium: 134 mmol/L — ABNORMAL LOW (ref 135–145)
Total Bilirubin: 1.8 mg/dL — ABNORMAL HIGH (ref 0.3–1.2)
Total Protein: 5 g/dL — ABNORMAL LOW (ref 6.5–8.1)

## 2021-11-24 LAB — RETICULOCYTES
Immature Retic Fract: 13.1 % (ref 2.3–15.9)
RBC.: 2.18 MIL/uL — ABNORMAL LOW (ref 4.22–5.81)
Retic Count, Absolute: 66.3 10*3/uL (ref 19.0–186.0)
Retic Ct Pct: 3 % (ref 0.4–3.1)

## 2021-11-24 LAB — LACTIC ACID, PLASMA: Lactic Acid, Venous: 1.2 mmol/L (ref 0.5–1.9)

## 2021-11-24 LAB — TECHNOLOGIST SMEAR REVIEW: Plt Morphology: NORMAL

## 2021-11-24 LAB — TYPE AND SCREEN
ABO/RH(D): O POS
Antibody Screen: NEGATIVE

## 2021-11-24 LAB — IRON AND TIBC
Iron: 120 ug/dL (ref 45–182)
Saturation Ratios: 58 % — ABNORMAL HIGH (ref 17.9–39.5)
TIBC: 209 ug/dL — ABNORMAL LOW (ref 250–450)
UIBC: 89 ug/dL

## 2021-11-24 LAB — FERRITIN: Ferritin: 982 ng/mL — ABNORMAL HIGH (ref 24–336)

## 2021-11-24 LAB — HEPATITIS B SURFACE ANTIBODY,QUALITATIVE: Hep B S Ab: REACTIVE — AB

## 2021-11-24 LAB — MAGNESIUM: Magnesium: 1.8 mg/dL (ref 1.7–2.4)

## 2021-11-24 LAB — CK: Total CK: 35 U/L — ABNORMAL LOW (ref 49–397)

## 2021-11-24 MED ORDER — PANTOPRAZOLE SODIUM 40 MG PO TBEC
40.0000 mg | DELAYED_RELEASE_TABLET | Freq: Every morning | ORAL | Status: DC
  Administered 2021-11-24 – 2021-11-26 (×3): 40 mg via ORAL
  Filled 2021-11-24 (×3): qty 1

## 2021-11-24 MED ORDER — ROPINIROLE HCL 0.25 MG PO TABS
0.2500 mg | ORAL_TABLET | Freq: Once | ORAL | Status: AC
  Administered 2021-11-24: 0.25 mg via ORAL
  Filled 2021-11-24: qty 1

## 2021-11-24 MED ORDER — ACETAMINOPHEN 325 MG PO TABS: 650.0000 mg | ORAL_TABLET | Freq: Four times a day (QID) | ORAL | Status: DC | PRN

## 2021-11-24 MED ORDER — SODIUM CHLORIDE 0.9 % IV SOLN
1.0000 g | INTRAVENOUS | Status: DC
  Administered 2021-11-24 – 2021-11-25 (×2): 1 g via INTRAVENOUS
  Filled 2021-11-24 (×2): qty 10

## 2021-11-24 MED ORDER — ESCITALOPRAM OXALATE 10 MG PO TABS
10.0000 mg | ORAL_TABLET | Freq: Every morning | ORAL | Status: DC
  Administered 2021-11-24 – 2021-11-26 (×3): 10 mg via ORAL
  Filled 2021-11-24 (×3): qty 1

## 2021-11-24 MED ORDER — CARVEDILOL 6.25 MG PO TABS
6.2500 mg | ORAL_TABLET | Freq: Two times a day (BID) | ORAL | Status: DC
  Administered 2021-11-24 – 2021-11-26 (×4): 6.25 mg via ORAL
  Filled 2021-11-24 (×4): qty 1

## 2021-11-24 MED ORDER — HEPARIN SODIUM (PORCINE) 1000 UNIT/ML DIALYSIS
1000.0000 [IU] | INTRAMUSCULAR | Status: DC | PRN
  Filled 2021-11-24: qty 1

## 2021-11-24 MED ORDER — PENTAFLUOROPROP-TETRAFLUOROETH EX AERO
1.0000 | INHALATION_SPRAY | CUTANEOUS | Status: DC | PRN
  Filled 2021-11-24: qty 116

## 2021-11-24 MED ORDER — FOLIC ACID 1 MG PO TABS
1.0000 mg | ORAL_TABLET | Freq: Every morning | ORAL | Status: DC
  Administered 2021-11-24 – 2021-11-26 (×3): 1 mg via ORAL
  Filled 2021-11-24 (×3): qty 1

## 2021-11-24 MED ORDER — AMIODARONE HCL 200 MG PO TABS
200.0000 mg | ORAL_TABLET | Freq: Every morning | ORAL | Status: DC
  Administered 2021-11-24 – 2021-11-26 (×3): 200 mg via ORAL
  Filled 2021-11-24 (×3): qty 1

## 2021-11-24 MED ORDER — CHLORHEXIDINE GLUCONATE CLOTH 2 % EX PADS
6.0000 | MEDICATED_PAD | Freq: Every day | CUTANEOUS | Status: DC
  Administered 2021-11-26: 6 via TOPICAL

## 2021-11-24 MED ORDER — DARBEPOETIN ALFA 100 MCG/0.5ML IJ SOSY
100.0000 ug | PREFILLED_SYRINGE | INTRAMUSCULAR | Status: DC
  Administered 2021-11-24: 100 ug via INTRAVENOUS
  Filled 2021-11-24 (×2): qty 0.5

## 2021-11-24 MED ORDER — LIDOCAINE-PRILOCAINE 2.5-2.5 % EX CREA
1.0000 | TOPICAL_CREAM | CUTANEOUS | Status: DC | PRN
  Filled 2021-11-24: qty 5

## 2021-11-24 MED ORDER — SODIUM CHLORIDE 0.9 % IV SOLN: INTRAVENOUS | Status: DC | PRN

## 2021-11-24 MED ORDER — LIDOCAINE HCL (PF) 1 % IJ SOLN: 5.0000 mL | INTRAMUSCULAR | Status: DC | PRN

## 2021-11-24 MED ORDER — PANTOPRAZOLE SODIUM 40 MG IV SOLR
40.0000 mg | Freq: Two times a day (BID) | INTRAVENOUS | Status: DC
  Administered 2021-11-24: 40 mg via INTRAVENOUS
  Filled 2021-11-24: qty 10

## 2021-11-24 MED ORDER — FERROUS SULFATE 325 (65 FE) MG PO TABS
324.0000 mg | ORAL_TABLET | Freq: Every morning | ORAL | Status: DC
  Administered 2021-11-24 – 2021-11-26 (×3): 324 mg via ORAL
  Filled 2021-11-24 (×3): qty 1

## 2021-11-24 MED ORDER — ACETAMINOPHEN 650 MG RE SUPP: 650.0000 mg | Freq: Four times a day (QID) | RECTAL | Status: DC | PRN

## 2021-11-24 NOTE — ED Notes (Signed)
Secure message sent to Dr. Velia Meyer, pt requesting medication specifically for RLS

## 2021-11-24 NOTE — Evaluation (Signed)
Physical Therapy Evaluation Patient Details Name: Darryl Diaz MRN: [id number] DOB: February 24, 1941 Today's Date: 11/24/2021  History of Present Illness  Pt is an 81 y/o male admitted secondary to weakness and near syncope. PMH includes a fib, dCHF, HTN, ESRD on HD, MDD, and alcohol abuse.  Clinical Impression  Pt admitted secondary to problem above with deficits below. Pt requiring min to min guard A for mobility tasks using RW. Reporting LEs felt a little more weak and shaky. Orthostatics negative and pt asymptomatic. Pt reports family can stay with him initially, and if they can provide necessary assist, recommending HHPT. However, if family unable to provide support, may need to consider SNF. Will continue to follow acutely.        Recommendations for follow up therapy are one component of a multi-disciplinary discharge planning process, led by the attending physician.  Recommendations may be updated based on patient status, additional functional criteria and insurance authorization.  Follow Up Recommendations Home health PT (if family can provide necessary assist. If not, may need to consider SNF)      Assistance Recommended at Discharge Frequent or constant Supervision/Assistance (initially)  Patient can return home with the following  A little help with walking and/or transfers;A little help with bathing/dressing/bathroom;Assistance with cooking/housework;Help with stairs or ramp for entrance;Assist for transportation;Direct supervision/assist for medications management    Equipment Recommendations None recommended by PT  Recommendations for Other Services       Functional Status Assessment Patient has had a recent decline in their functional status and demonstrates the ability to make significant improvements in function in a reasonable and predictable amount of time.     Precautions / Restrictions Precautions Precautions: Fall Restrictions Weight Bearing Restrictions: No       Mobility  Bed Mobility Overal bed mobility: Needs Assistance Bed Mobility: Supine to Sit, Sit to Supine     Supine to sit: Supervision, HOB elevated Sit to supine: Supervision   General bed mobility comments: Supervision for safety. Use of bed rails    Transfers Overall transfer level: Needs assistance Equipment used: Rolling walker (2 wheels) Transfers: Sit to/from Stand Sit to Stand: Min guard, From elevated surface           General transfer comment: Min guard for safety. Mild shakiness noted.    Ambulation/Gait Ambulation/Gait assistance: Min assist, Min guard Gait Distance (Feet): 20 Feet Assistive device: Rolling walker (2 wheels) Gait Pattern/deviations: Step-through pattern, Decreased stride length Gait velocity: Decreased     General Gait Details: Mild shakiness noted with ambulation requiring min to min guard A. Cues for safe use of RW.  Stairs            Wheelchair Mobility    Modified Rankin (Stroke Patients Only)       Balance Overall balance assessment: Needs assistance Sitting-balance support: No upper extremity supported, Feet supported Sitting balance-Leahy Scale: Fair     Standing balance support: Bilateral upper extremity supported Standing balance-Leahy Scale: Poor Standing balance comment: Reliant on UE support                             Pertinent Vitals/Pain Pain Assessment Pain Assessment: No/denies pain    Home Living Family/patient expects to be discharged to:: Private residence Living Arrangements: Alone Available Help at Discharge: Friend(s);Available 24 hours/day Type of Home: Other(Comment) (townhome) Home Access: Level entry       Home Layout: One level Home Equipment: Conservation officer, nature (2  wheels);Rollator (4 wheels);Shower seat      Prior Function Prior Level of Function : History of Falls (last six months);Needs assist             Mobility Comments: Uses rollator for mobility ADLs  Comments: [employer] assists with transportation to store. Has transportation to medical appointments and dialysis. Independent with ADLS     Hand Dominance        Extremity/Trunk Assessment   Upper Extremity Assessment Upper Extremity Assessment: Defer to OT evaluation    Lower Extremity Assessment Lower Extremity Assessment: Generalized weakness    Cervical / Trunk Assessment Cervical / Trunk Assessment: Kyphotic  Communication   Communication: No difficulties  Cognition Arousal/Alertness: Awake/alert Behavior During Therapy: Impulsive Overall Cognitive Status: No family/caregiver present to determine baseline cognitive functioning                                 General Comments: Impulsive at times.        General Comments      Exercises     Assessment/Plan    PT Assessment Patient needs continued PT services  PT Problem List Decreased strength;Decreased activity tolerance;Decreased balance;Decreased mobility;Decreased knowledge of use of DME;Decreased knowledge of precautions;Decreased safety awareness;Decreased cognition       PT Treatment Interventions DME instruction;Gait training;Stair training;Therapeutic activities;Functional mobility training;Therapeutic exercise;Balance training;Patient/family education    PT Goals (Current goals can be found in the Care Plan section)  Acute Rehab PT Goals Patient Stated Goal: to go home PT Goal Formulation: With patient Time For Goal Achievement: 12/08/21 Potential to Achieve Goals: Good    Frequency Min 3X/week     Co-evaluation               AM-PAC PT "6 Clicks" Mobility  Outcome Measure Help needed turning from your back to your side while in a flat bed without using bedrails?: None Help needed moving from lying on your back to sitting on the side of a flat bed without using bedrails?: A Little Help needed moving to and from a bed to a chair (including a wheelchair)?: A  Little Help needed standing up from a chair using your arms (e.g., wheelchair or bedside chair)?: A Little Help needed to walk in hospital room?: A Little Help needed climbing 3-5 steps with a railing? : A Lot 6 Click Score: 18    End of Session Equipment Utilized During Treatment: Gait belt Activity Tolerance: Patient limited by fatigue Patient left: in bed;with call bell/phone within reach;with bed alarm set Nurse Communication: Mobility status PT Visit Diagnosis: Unsteadiness on feet (R26.81);Muscle weakness (generalized) (M62.81);History of falling (Z91.81)    Time: 1610-1630 PT Time Calculation (min) (ACUTE ONLY): 20 min   Charges:   PT Evaluation $PT Eval Moderate Complexity: 1 Mod          Reuel Derby, PT, DPT  Acute Rehabilitation Services  Office: [phone]   Rudean Hitt 11/24/2021, 4:47 PM

## 2021-11-24 NOTE — Progress Notes (Signed)
Pt receives out-pt HD at [facility] on MWF. Pt arrives at 11:25 for 11:45 chair time. Will assist as needed.   Preesha Benjamin Renal Navigator [phone] 

## 2021-11-24 NOTE — Progress Notes (Signed)
OT Cancellation Note  Patient Details Name: Darryl Diaz MRN: [id number] DOB: 1940/11/26   Cancelled Treatment:    Reason Eval/Treat Not Completed: Patient at procedure or test/ unavailable. Pt off the floor at HD at this time, OT will follow up as schedule allows.   [name] [name] 11/24/2021, 11:57 AM

## 2021-11-24 NOTE — Procedures (Signed)
I was present at this dialysis session. I have reviewed the session itself and made appropriate changes.   Filed Weights   11/23/21 1906  Weight: 87.5 kg    Recent Labs  Lab 11/24/21 0504  NA 134*  K 4.1  CL 98  CO2 25  GLUCOSE 76  BUN 20  CREATININE 2.69*  CALCIUM 8.4*  PHOS 3.1    Recent Labs  Lab 11/20/21 0726 11/23/21 1925 11/23/21 2024 11/23/21 2040 11/24/21 0504  WBC 3.5* 3.5*  --   --  3.9*  NEUTROABS  --  1.7  --   --  2.5  HGB 8.3* 7.7* 7.1* 7.5* 7.8*  HCT 25.2* 23.4* 21.0* 22.0* 22.9*  MCV 102.9* 105.9*  --   --  102.7*  PLT 121* 125*  --   --  129*    Scheduled Meds:  amiodarone  200 mg Oral q morning   carvedilol  6.25 mg Oral BID   Chlorhexidine Gluconate Cloth  6 each Topical Q0600   darbepoetin (ARANESP) injection - DIALYSIS  100 mcg Intravenous Q Wed-HD   escitalopram  10 mg Oral q morning   ferrous sulfate  324 mg Oral q morning   folic acid  1 mg Oral q morning   pantoprazole  40 mg Oral q morning   Continuous Infusions:  cefTRIAXone (ROCEPHIN)  IV     PRN Meds:.acetaminophen **OR** acetaminophen, heparin, lidocaine (PF), lidocaine-prilocaine, pentafluoroprop-tetrafluoroeth   Santiago Bumpers,  MD 11/24/2021, 2:23 PM

## 2021-11-24 NOTE — Progress Notes (Signed)
NURSING PROGRESS NOTE  Darryl Diaz [id number] Admission Data: 11/24/2021 5:00 PM Attending Provider: Alma Friendly, MD MCN:[id number], [name], MD Code Status: Full  Darryl Diaz is a 81 y.o. male patient admitted from ED:  -No acute distress noted.  -No complaints of shortness of breath.  -No complaints of chest pain.   Cardiac Monitoring: Bedside telemetry monitoring in place. Cardiac monitor yields:normal sinus rhythm.  Blood pressure 137/66, pulse 66, temperature (!) 97.5 F (36.4 C), temperature source Oral, resp. rate (!) 21, height 6' (1.829 m), weight 86 kg, SpO2 99 %.   IV Fluids:  IV in place, occlusive dsg intact without redness, IV cath forearm right, right upper arm, condition patent and no redness none. Left upper arm fistula   Allergies:  Penicillin g sodium and Penicillins  Past Medical History:   has a past medical history of Alcohol abuse (36/62/9476), 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[place]) (03/17/2020), Alcoholism (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[place]), Atrial fibrillation ([place]), Cardiac arrest ([place]) (04/20/2019), Chronic diastolic CHF (congestive heart failure) ([place]), Dysrhythmia, 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[place]), Fatty liver, Gastritis, GERD (gastroesophageal reflux disease), H/O seasonal allergies, History of blood transfusion, History of GI bleed, adenomatous colonic polyps (08/10/2017), Hyperlipidemia, Hypertension, Insomnia, Major depressive disorder, Mallory-Weiss tear, OA (osteoarthritis), OSA (obstructive sleep apnea), Persistent atrial fibrillation with rapid ventricular response ([place]) (02/05/2020), Recurrent syncope, Restless legs syndrome (RLS), and 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([place]).  Past Surgical History:   has a past surgical history that includes Ankle surgery (Left, 2014); Tonsillectomy; Hip Arthroplasty (Left, 04/01/2016); Hip Closed Reduction (Left, 04/30/2016); Colonoscopy; Esophagogastroduodenoscopy (egd) with  propofol (N/A, 02/28/2019); biopsy (02/28/2019); Total hip arthroplasty (Right, 10/15/2019); Cardioversion (02/06/2020); Cardioversion (N/A, 02/06/2020); Cardioversion (N/A, 02/24/2020); IR Radiologist Eval & Mgmt (12/31/2020); Colonoscopy with propofol (N/A, 01/08/2021); Hot hemostasis (N/A, 01/08/2021); AV fistula placement (Left, 04/21/2021); Amputation (Left, 05/19/2021); Joint replacement; Fracture surgery; and Vascular surgery.  Social History:   reports that he has quit smoking. His smoking use included cigarettes. He has a 20.00 pack-year smoking history. He has never used smokeless tobacco. He reports that he does not currently use alcohol after a past usage of about 3.0 standard drinks of alcohol per week. He reports that he does not use drugs.  Skin:  Red buttock, left flank bruising, left chin scab, right buttock and scrotal bruising. Left great toe amputation. Incision with staples on the back of patient's head.   Patient/Family orientated to room. Information packet given to patient/family. Admission inpatient armband information verified with patient/family to include name and date of birth and placed on patient arm. Side rails up x 2, fall assessment and education completed with patient/family. Patient/family able to verbalize understanding of risk associated with falls and verbalized understanding to call for assistance before getting out of bed. Call light within reach. Patient/family able to voice and demonstrate understanding of unit orientation instructions.    Will continue to evaluate and treat per MD orders.

## 2021-11-24 NOTE — Progress Notes (Signed)
PROGRESS NOTE  Darryl Diaz WJX:[id number] DOB: 1940/08/31 DOA: 11/23/2021 PCP: [name], MD  HPI/Recap of past 24 hours: Darryl Diaz is a 81 y.o. male with medical history significant for end-stage renal disease on hemodialysis on Monday, Wednesday, Friday, paroxysmal atrial fibrillation not chronically anticoagulated in the setting of recurrent falls, essential hypertension, pancytopenia, chronic diastolic heart failure, chronic hyponatremia with baseline serum sodium level 130-134, who is admitted to [facility] on 11/23/2021 with generalized weakness.  Labs notable for pancytopenia, with possible UTI.  Patient admitted for severe sepsis likely due to UTI, acute on chronic anemia with chronic pancytopenia.   Today, patient was seen during dialysis, denied any further complaints.   Assessment/Plan: Principal Problem:   Generalized weakness Active Problems:   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HCC)   Paroxysmal atrial fibrillation (HCC)   GERD (gastroesophageal reflux disease)   Acute on chronic anemia   Chronic hyponatremia   History of hypertension   Chronic diastolic CHF (congestive heart failure) (HCC)   Pancytopenia (HCC)   UTI (urinary tract infection)   Severe sepsis (HCC)   Lactic acidosis   Generalized weakness Unknown etiology, multifactorial with likely 2/2 severe sepsis 2/2 UTI CK pending Further management for UTI PT/OT   Severe sepsis likely due to urinary tract infection Tachypnea, leukopenia, LA 3 on admission UA with pyuria, moderate leukocyte esterase, no evidence of squamous epithelial cells UC pending BC x2 pending Chest x-ray unremarkable Trend LA Continue IV Rocephin for now DC IV fluid Monitor closely   Acute on chronic anemia of chronic disease Chronic pancytopenia Noted positive FOBT Baseline hemoglobin 9-11, presenting hemoglobin found to be slightly lower at 7.7 Normal folic acid [id number]/M57 level, normal  INR Peripheral smear via technologist smear review pending    Repeat anemia panel pending Type and screen ordered Daily CBC GI consultation, may be as an outpatient  ESRD-Oliguric On HD M/W/F Neurology consulted Further management per neurology    Chronic diastolic heart failure Appears euvolemic Echocardiogram performed in September 2022 notable for LV EF 65% as well as indeterminate diastolic parameters Strict I's and O's, daily weights   Paroxysmal atrial fibrillation CHA2DS2-VASc score of 6, not on AC due to recurrent falls  Continue amiodarone  Telemetry   Essential Hypertension BP stable Continue home Coreg     Chronic hyponatremia Daily BMP   Depression Continue Lexapro    GERD Continue PPI     Estimated body mass index is 26.16 kg/m as calculated from the following:   Height as of this encounter: 6' (1.829 m).   Weight as of this encounter: 87.5 kg.     Code Status: DNR  Family Communication: None at bedside  Disposition Plan: Status is: Observation The patient will require care spanning > 2 midnights and should be moved to inpatient because: Level of care      Consultants: Nephrology  Procedures: None  Antimicrobials: Ceftriaxone  DVT prophylaxis: SCD-noted FOBT positive, anemia   Objective: Vitals:   11/24/21 1330 11/24/21 1400 11/24/21 1430 11/24/21 1440  BP: (!) 124/52 135/63 (!) 131/53 (!) 131/53  Pulse: 67 62 (!) 59 61  Resp: (!) '21 18 19 18  '$ Temp:      TempSrc:      SpO2: 99% 100% 100% 99%  Weight:      Height:        Intake/Output Summary (Last 24 hours) at 11/24/2021 1448 Last data filed at 11/24/2021 0016 Gross per 24 hour  Intake 850.66  ml  Output --  Net 850.66 ml   Filed Weights   11/23/21 1906  Weight: 87.5 kg    Exam: General: NAD  Cardiovascular: S1, S2 present Respiratory: CTAB Abdomen: Soft, nontender, nondistended, bowel sounds present Musculoskeletal: No bilateral pedal edema noted Skin:  Normal Psychiatry: Normal mood     Data Reviewed: CBC: Recent Labs  Lab 11/19/21 1236 11/20/21 0726 11/23/21 1925 11/23/21 2024 11/23/21 2040 11/24/21 0504  WBC 3.6* 3.5* 3.5*  --   --  3.9*  NEUTROABS  --   --  1.7  --   --  2.5  HGB 8.4* 8.3* 7.7* 7.1* 7.5* 7.8*  HCT 24.0* 25.2* 23.4* 21.0* 22.0* 22.9*  MCV 99.6 102.9* 105.9*  --   --  102.7*  PLT 129* 121* 125*  --   --  660*   Basic Metabolic Panel: Recent Labs  Lab 11/19/21 1236 11/20/21 0726 11/23/21 1925 11/23/21 2024 11/23/21 2040 11/24/21 0504  NA 130* 134* 131* 130* 130* 134*  K 4.2 3.6 4.3 4.2 4.2 4.1  CL 90* 96* 94* 93* 93* 98  CO2 '25 26 23  '$ --   --  25  GLUCOSE 86 115* 72 74 75 76  BUN 26* '14 18 16 17 20  '$ CREATININE 3.02* 2.01* 2.83* 2.90* 2.90* 2.69*  CALCIUM 8.9 8.0* 8.5*  --   --  8.4*  MG  --   --  1.9  --   --  1.8  PHOS  --   --   --   --   --  3.1   GFR: Estimated Creatinine Clearance: 24 mL/min (A) (by C-G formula based on SCr of 2.69 mg/dL (H)). Liver Function Tests: Recent Labs  Lab 11/20/21 0726 11/23/21 1925 11/24/21 0504  AST 32 33 28  ALT '19 20 18  '$ ALKPHOS 114 126 114  BILITOT 1.6* 1.2 1.8*  PROT 5.1* 5.6* 5.0*  ALBUMIN 3.1* 3.3* 2.8*   No results for input(s): "LIPASE", "AMYLASE" in the last 168 hours. No results for input(s): "AMMONIA" in the last 168 hours. Coagulation Profile: Recent Labs  Lab 11/23/21 1925  INR 1.1   Cardiac Enzymes: No results for input(s): "CKTOTAL", "CKMB", "CKMBINDEX", "TROPONINI" in the last 168 hours. BNP (last 3 results) No results for input(s): "PROBNP" in the last 8760 hours. HbA1C: No results for input(s): "HGBA1C" in the last 72 hours. CBG: No results for input(s): "GLUCAP" in the last 168 hours. Lipid Profile: No results for input(s): "CHOL", "HDL", "LDLCALC", "TRIG", "CHOLHDL", "LDLDIRECT" in the last 72 hours. Thyroid Function Tests: Recent Labs    11/23/21 1925  TSH 2.411   Anemia Panel: Recent Labs    11/23/21 1925   VITAMINB12 633  FOLATE 21.1   Urine analysis:    Component Value Date/Time   COLORURINE YELLOW 11/23/2021 2244   APPEARANCEUR CLEAR 11/23/2021 2244   LABSPEC 1.004 (L) 11/23/2021 2244   PHURINE 9.0 (H) 11/23/2021 2244   GLUCOSEU NEGATIVE 11/23/2021 2244   HGBUR MODERATE (A) 11/23/2021 2244   BILIRUBINUR NEGATIVE 11/23/2021 2244   KETONESUR NEGATIVE 11/23/2021 2244   PROTEINUR NEGATIVE 11/23/2021 2244   NITRITE NEGATIVE 11/23/2021 2244   LEUKOCYTESUR MODERATE (A) 11/23/2021 2244   Sepsis Labs: '@LABRCNTIP'$ (procalcitonin:4,lacticidven:4)  ) Recent Results (from the past 240 hour(s))  Blood Culture (routine x 2)     Status: None (Preliminary result)   Collection Time: 11/23/21  7:27 PM   Specimen: BLOOD  Result Value Ref Range Status   Specimen Description BLOOD RIGHT ANTECUBITAL  Final   Special Requests   Final    BOTTLES DRAWN AEROBIC AND ANAEROBIC Blood Culture adequate volume   Culture   Final    NO GROWTH < 12 HOURS Performed at [facility], [address] [place], [place] [address]    Report Status PENDING  Incomplete  Blood Culture (routine x 2)     Status: None (Preliminary result)   Collection Time: 11/23/21  8:00 PM   Specimen: BLOOD RIGHT WRIST  Result Value Ref Range Status   Specimen Description BLOOD RIGHT WRIST  Final   Special Requests   Final    BOTTLES DRAWN AEROBIC AND ANAEROBIC Blood Culture adequate volume   Culture   Final    NO GROWTH < 12 HOURS Performed at [facility], [place] [address] [place], [place] [address]    Report Status PENDING  Incomplete      Studies: DG Chest Port 1 View  Result Date: 11/23/2021 CLINICAL DATA:  Weakness, lightheadedness, near syncope EXAM: PORTABLE CHEST 1 VIEW COMPARISON:  09/27/2021 FINDINGS: Single frontal view of the chest demonstrates an unremarkable cardiac silhouette. No acute airspace disease, effusion, or pneumothorax. No acute bony abnormality. IMPRESSION: 1. No acute intrathoracic  process. Electronically Signed   By: Randa Ngo M.D.   On: 11/23/2021 19:55    Scheduled Meds:  amiodarone  200 mg Oral q morning   carvedilol  6.25 mg Oral BID   Chlorhexidine Gluconate Cloth  6 each Topical Q0600   darbepoetin (ARANESP) injection - DIALYSIS  100 mcg Intravenous Q Wed-HD   escitalopram  10 mg Oral q morning   ferrous sulfate  324 mg Oral q morning   folic acid  1 mg Oral q morning   pantoprazole  40 mg Oral q morning    Continuous Infusions:  cefTRIAXone (ROCEPHIN)  IV       LOS: 0 days     Alma Friendly, MD [facility]  If 7PM-7AM, please contact night-coverage [email or web address] 11/24/2021, 2:48 PM

## 2021-11-24 NOTE — ED Notes (Signed)
Consent signed at bedside with this RN.  Given to transport prior to transport to Hemodialysis.

## 2021-11-24 NOTE — ED Notes (Signed)
Pt has fistula on left side for dialysis

## 2021-11-24 NOTE — Consult Note (Signed)
ESRD Consult Note  Requesting provider: Lesia Sago, MD Service requesting consult: Hospitalist Reason for consult: ESRD, provision of dialysis Indication for acute dialysis?: End Stage Renal Disease  Outpatient dialysis unit: [facility] Outpatient dialysis prescription: MWF, F180, bfr 400, edw 84kg, 4hr, 2K, 2Ca, LUE AVF, mircera 20mg q2 weeks (due on 7/24)  Assessment/Recommendations: DBURLON CENTRELLAis a/an 81y.o. male with a past medical history notable for ESRD on HD admitted with weakness.   # ESRD: plan for HD today and maintain MWF schedule  # Volume/ hypertension: EDW 84kgkg. No UF today. Continue home BP meds  # Anemia of Chronic Kidney Disease: Hemoglobin 7.8. Will dose aranesp early with HD today. No iron for now given infection concern  # Secondary Hyperparathyroidism/Hyperphosphatemia: not requiring any current treatment   # Vascular access: AVF weith no issues  # Weakness: concern for UTI but no symptoms. UA hard to interpret in ESRD patient. Further work up per primary. FTT playing some role.  # Additional recommendations: - Dose all meds for creatinine clearance < 10 ml/min  - Unless absolutely necessary, no MRIs with gadolinium.  - Implement save arm precautions.  Prefer needle sticks in the dorsum of the hands or wrists.  No blood pressure measurements in arm. - If blood transfusion is requested during hemodialysis sessions, please alert [facility] to the session.  - Use synthetic opioids (Fentanyl/Dilaudid) if needed  Recommendations were discussed with the primary team.  CC: Generalized weakness  History of Present Illness: Darryl WILCZYNSKIis a/an 81y.o. male with a past medical history of ESRD who presents with weakness.  The patient states he has been feeling really well lately.  Has been at home by himself without significant issues overall.  He was feeling like himself 2 days ago.  He underwent dialysis on Monday with no problems.  Has been  getting dialysis regularly with no issues.  However, he may be forgetting that he was in the emergency department on 7/15 for loss of consciousness when he had a syncopal episode and work-up in the emergency department was reassuring without major concern.  Over the past 1 to 2 days he has had worsening generalized weakness.  Last night it got to the point where he was unable to get out of bed and that is why he came to the emergency department.  He does not have any focal weakness, nausea, vomiting, shortness of breath, chest pain, fevers, chills, cough, dysuria, hematuria.  In the emergency department urinalysis demonstrated pyuria and he was also found to have anemia with hemoglobin of 7.8.  The patient did receive IV fluids in the emergency department.  As well as antibiotics due to concern for possible infection.    Medications:  Current Facility-Administered Medications  Medication Dose Route Frequency Provider Last Rate Last Admin   acetaminophen (TYLENOL) tablet 650 mg  650 mg Oral Q6H PRN Howerter, Justin B, DO       Or   acetaminophen (TYLENOL) suppository 650 mg  650 mg Rectal Q6H PRN Howerter, Justin B, DO       amiodarone (PACERONE) tablet 200 mg  200 mg Oral q morning Howerter, Justin B, DO       carvedilol (COREG) tablet 6.25 mg  6.25 mg Oral BID Howerter, Justin B, DO       cefTRIAXone (ROCEPHIN) 1 g in sodium chloride 0.9 % 100 mL IVPB  1 g Intravenous Q24H Howerter, Justin B, DO       escitalopram (  LEXAPRO) tablet 10 mg  10 mg Oral q morning Howerter, Justin B, DO       ferrous sulfate EC tablet 324 mg  324 mg Oral q morning Howerter, Justin B, DO       folic acid (FOLVITE) tablet 1 mg  1 mg Oral q morning Howerter, Justin B, DO       pantoprazole (PROTONIX) EC tablet 40 mg  40 mg Oral q morning Howerter, Justin B, DO       Current Outpatient Medications  Medication Sig Dispense Refill   acetaminophen (TYLENOL) 325 MG tablet Take 2 tablets (650 mg total) by mouth every 4  (four) hours as needed for headache or mild pain. (Patient taking differently: Take 325-650 mg by mouth every 4 (four) hours as needed for headache.)     albuterol (PROVENTIL) (2.5 MG/3ML) 0.083% nebulizer solution Take 3 mLs (2.5 mg total) by nebulization every 6 (six) hours as needed for wheezing or shortness of breath. (Patient not taking: Reported on 09/29/2021) 75 mL 12   amiodarone (PACERONE) 200 MG tablet TAKE 1 TABLET BY MOUTH ONCE DAILY (Patient taking differently: Take 200 mg by mouth every morning.) 90 tablet 2   atorvastatin (LIPITOR) 20 MG tablet Take 1 tablet (20 mg total) by mouth daily. (Patient not taking: Reported on 11/23/2021) 30 tablet 0   carvedilol (COREG) 6.25 MG tablet Take 1 tablet (6.25 mg total) by mouth 2 (two) times daily. 180 tablet 3   cetirizine (ZYRTEC) 10 MG tablet Take 10 mg by mouth daily. (Patient not taking: Reported on 06/08/2021)     escitalopram (LEXAPRO) 10 MG tablet Take 1 tablet (10 mg total) by mouth daily. (Patient taking differently: Take 10 mg by mouth every morning.) 30 tablet 0   ferrous sulfate 324 MG TBEC Take 324 mg by mouth every morning.     FOLIC ACID PO Take 1 tablet by mouth every morning.     Magnesium Oxide 400 MG CAPS Take 1 capsule (400 mg total) by mouth daily.     Multiple Vitamins-Minerals (CENTRUM SILVER 50+MEN) TABS Take 1 tablet by mouth every other day.     nitroGLYCERIN (NITROSTAT) 0.4 MG SL tablet Place 1 tablet (0.4 mg total) under the tongue every 5 (five) minutes x 3 doses as needed for chest pain. 30 tablet 12   pantoprazole (PROTONIX) 40 MG tablet Take 1 tablet (40 mg total) by mouth daily. (Patient taking differently: Take 40 mg by mouth every morning.) 30 tablet 0   traZODone (DESYREL) 50 MG tablet Take 1 tablet (50 mg total) by mouth at bedtime. (Patient taking differently: Take 50 mg by mouth at bedtime as needed for sleep.) 30 tablet 0   vitamin B-12 (CYANOCOBALAMIN) 500 MCG tablet Take 1 tablet (500 mcg total) by mouth  daily. 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[facility])    Atrial fibrillation ([facility])    Cardiac arrest ([place]) 04/20/2019   12 min CPR with epi   Chronic diastolic CHF (congestive heart failure) ([place])    Dysrhythmia    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[place])    Mon Wed Fri   Fatty  liver    Gastritis    GERD (gastroesophageal reflux disease)    H/O seasonal allergies    History of blood transfusion    History of GI bleed    Hx of adenomatous colonic polyps 08/10/2017   Hyperlipidemia    Hypertension    Insomnia    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   Mallory-Weiss tear    OA (osteoarthritis)    hands   OSA (obstructive sleep apnea)    No longer uses CPAP   Persistent atrial fibrillation with rapid ventricular response ([place]) 02/05/2020   Recurrent syncope    Restless legs syndrome (RLS)    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([place])      SOCIAL HISTORY Social History   Socioeconomic History   Marital status: Widowed    Spouse name: Not on file   Number of children: 2   Years of education: college   Highest education level: Not on file  Occupational History   Occupation: retired  Tobacco Use   Smoking status: Former    Packs/day: 1.00    Years: 20.00    Total pack years: 20.00    Types: Cigarettes   Smokeless tobacco: Never   Tobacco comments:       quit 20 years  smoked on and off for 20 years  Vaping Use   Vaping Use: Never used  Substance and Sexual Activity   Alcohol use: Not Currently    Alcohol/week: 3.0 standard drinks of alcohol    Types: 3 Glasses of wine per week    Comment: h/o heavy use   Drug use: Never   Sexual activity: Not  Currently  Other Topics Concern   Not on file  Social History Narrative   ** Merged History Encounter **       The patient is divorced w/ 1 son 1 daughter He is a retired Regulatory affairs officer, he lived in [place] but [employer] including windows on the world in the [place] room in [place] He moved to [place] to be near his sister who liv   es in [place] alcoholic member of AA 2 caffeinated beverages daily prior smoker no drugs   Social Determinants of Radio broadcast assistant Strain: Low Risk  (10/23/2020)   Overall Financial Resource Strain (CARDIA)    Difficulty of Paying Living Expenses: Not hard at all  Food Insecurity: No Food Insecurity (10/23/2020)   Hunger Vital Sign    Worried About Running Out of Food in the Last Year: Never true    [place] in the Last Year: Never true  Transportation Needs: No Transportation Needs (10/23/2020)   PRAPARE - Hydrologist (Medical): No    Lack of Transportation (Non-Medical): No  Physical Activity: Not on file  Stress: No Stress Concern Present (10/23/2020)   Advance    Feeling of Stress : Not at all  Social Connections: Moderately Integrated (10/23/2020)   Social Connection and Isolation Panel [NHANES]    Frequency of Communication with Friends and Family: More than three times a week    Frequency of Social Gatherings with Friends and Family: More than three times a week    Attends Religious Services: 1 to 4 times per year    Active Member of [employer] or Organizations: No    Attends Archivist Meetings: 1 to 4 times per year    Marital Status:  Widowed  Intimate Partner Violence: Not At Risk (10/23/2020)   Humiliation, Afraid, Rape, and Kick questionnaire    Fear of Current or Ex-Partner: No    Emotionally Abused: No    Physically Abused: No    Sexually Abused: No     FAMILY  HISTORY Family History  Problem Relation Age of Onset   Heart disease Mother 10   Hypertension Mother    Arthritis Mother    Heart failure Father 64   Stroke Maternal Aunt    Heart failure Sister    Colon cancer Neg Hx    Esophageal cancer Neg Hx    Stomach cancer Neg Hx    Rectal cancer Neg Hx      Review of Systems: 12 systems were reviewed and negative except per HPI  Physical Exam: Vitals:   11/24/21 0738 11/24/21 0739  BP: (!) 142/60   Pulse: (!) 58   Resp: 12   Temp: (!) 97.5 F (36.4 C)   SpO2: 98% 100%   No intake/output data recorded.  Intake/Output Summary (Last 24 hours) at 11/24/2021 7591 Last data filed at 11/24/2021 0016 Gross per 24 hour  Intake 850.66 ml  Output --  Net 850.66 ml   General: well-appearing, no acute distress HEENT: anicteric sclera, MMM CV: normal rate, no murmurs, no edema Lungs: bilateral chest rise, normal wob Abd: soft, non-tender, non-distended Skin: no visible lesions or rashes Psych: alert, engaged, appropriate mood and affect Neuro: normal speech, no gross focal deficits   Test Results Reviewed Lab Results  Component Value Date   NA 134 (L) 11/24/2021   K 4.1 11/24/2021   CL 98 11/24/2021   CO2 25 11/24/2021   BUN 20 11/24/2021   CREATININE 2.69 (H) 11/24/2021   CALCIUM 8.4 (L) 11/24/2021   ALBUMIN 2.8 (L) 11/24/2021   PHOS 3.1 11/24/2021    I have reviewed relevant outside healthcare records

## 2021-11-24 NOTE — ED Notes (Signed)
Pt assisted with bedside commode.

## 2021-11-24 NOTE — H&P (Signed)
History and Physical    PLEASE NOTE THAT DRAGON DICTATION SOFTWARE WAS USED IN THE CONSTRUCTION OF THIS NOTE.   Darryl Diaz:[id number] DOB: 1941-03-18 DOA: 11/23/2021  PCP: [name], MD  Patient coming from: home   I have personally briefly reviewed patient's old medical records in [place]  Chief Complaint: Generalized weakness  HPI: Darryl Diaz is a 81 y.o. male with medical history significant for end-stage renal disease on hemodialysis on Monday, Wednesday, Friday, paroxysmal atrial fibrillation not chronically anticoagulated in the setting of recurrent falls, essential hypertension, pancytopenia, chronic diastolic heart failure, chronic hyponatremia with baseline serum sodium level 130-134, who is admitted to [facility] on 11/23/2021 with generalized weakness after presenting from home to [facility] ED complaining of such.   The patient reports 2 days of progressive generalized weakness in the absence of any acute focal weakness nor any associated acute focal numbness, paresthesias, facial droop, slurred speech, expressive aphasia, acute change in vision, dysphagia, vertigo.  He conveys that he lives by himself at home, and, at baseline is able to perform all of his ADLs independently.  However, over the last 2 days in the setting of his progressive generalized weakness, has noted progressive difficulty with ADL completion.   Denies any associated subjective fever, chills, rigors, generalized myalgias.  No recent chest pain, shortness of breath, cough, palpitations, dizziness, presyncope, syncope.  Denies any recent abdominal pain, diarrhea.  He notes in the context of his incisional disease on hemodialysis, that he continues to produce urine, and denies any recent dysuria.    Per chart review, the patient has had pancytopenia since May 2023, at which time he developed leukopenia.  He has had thrombocytopenia since March 2023, along with chronic anemia  associated with baseline hemoglobin range of 8-11.  Denies any recent melena, hematochezia, gross hematuria, or hematemesis.  Reports a history of acute upper GI bleed in April 2019, prompting EGD/colonoscopy at that time, with colonoscopy notable for adenomatous colonic polyp.  In the setting of a history of recurrent falls, he is not on any blood thinners at home, including no aspirin.     ED Course:  Vital signs in the ED were notable for the following: Afebrile; heart rate 60-68; pressure 130/68; respiratory rate 16-22, oxygen saturation 95 to 100% on room air.  Labs were notable for the following: CMP notable for the following: Sodium 131, bicarbonate 23, glucose 72, liver enzymes within normal limits.  High-sensitivity troponin I x2 values were within to be 6.  TSH 2.11.  Urinalysis notable for 21-50 white blood cells, moderate leukocyte esterase, and no associated squamous epithelial cells.  CBC notable for white cell count 3500, unchanged from most recent prior value on 11/20/2021, hemoglobin 7.7 compared to 8.3 on 11/20/2021, with presenting hemoglobin notable for macrocytic/normochromic findings as well as mildly elevated RDW, platelet count 125 compared to 121 on 11/20/2021.  Folic acid and E95 levels found to be within normal limits.  Initial lactate 3.0, with repeat value trending down to 2.8.  INR 1.1.  Blood cultures x2 as well as urine culture collected prior to initiation of IV antibiotic.  In evaluating the patient's acute on chronic anemia, EDP performed DRE, which showed brown-colored stool that was subsequently found to be fecal occult blood positive.  Imaging and additional notable ED work-up: EKG shows sinus rhythm with heart rate 60, and no evidence of T wave or ST changes, including no evidence of ST elevation.  Chest x-ray shows  no evidence of acute cardiopulmonary process including no evidence of infiltrate, edema, effusion, or pneumothorax.  While in the ED, the following were  administered: Protonix 40 mg IV x1, Rocephin, LR x750 cc bolus followed by initiation of continuous LR at 75 cc/h.  Subsequently, the patient was admitted for generalized weakness in the setting of severe sepsis due to urinary tract infection with presentation also notable for acute on chronic anemia superimposed on chronic pancytopenia.     Review of Systems: As per HPI otherwise 10 point review of systems negative.   Past Medical History:  Diagnosis Date   Alcohol abuse 09/73/5329   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[place]) 03/17/2020   Alcoholism ([place])    in remission following wife's death   Anemia    Anxiety    Ascending aorta dilation (HCC)    Atrial fibrillation (HCC)    Cardiac arrest ([place]) 04/20/2019   12 min CPR with epi   Chronic diastolic CHF (congestive heart failure) ([place])    Dysrhythmia    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[place])    Mon Wed Fri   Fatty liver    Gastritis    GERD (gastroesophageal reflux disease)    H/O seasonal allergies    History of blood transfusion    History of GI bleed    Hx of adenomatous colonic polyps 08/10/2017   Hyperlipidemia    Hypertension    Insomnia    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   Mallory-Weiss tear    OA (osteoarthritis)    hands   OSA (obstructive sleep apnea)    No longer uses CPAP   Persistent atrial fibrillation with rapid ventricular response ([place]) 02/05/2020   Recurrent syncope    Restless legs syndrome (RLS)    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([place])     Past Surgical History:  Procedure Laterality Date   AMPUTATION Left 05/19/2021   Procedure: LEFT GREAT TOE AMPUTATION;  Surgeon: Newt Minion, MD;  Location: [place];  Service: Orthopedics;  Laterality: Left;   ANKLE SURGERY Left 2014   had rods put in    AV FISTULA PLACEMENT Left 04/21/2021   Procedure: LEFT ARM ARTERIOVENOUS (AV) FISTULA CREATION - Left brachiocephalic fistula;  Surgeon: Marty Heck, MD;  Location: [place];   Service: Vascular;  Laterality: Left;   BIOPSY  02/28/2019   Procedure: BIOPSY;  Surgeon: Milus Banister, MD;  Location: [place];  Service: Endoscopy;;   CARDIOVERSION  02/06/2020   CARDIOVERSION N/A 02/06/2020   Procedure: CARDIOVERSION;  Surgeon: Werner Lean, MD;  Location: [place] ENDOSCOPY;  Service: Cardiovascular;  Laterality: N/A;   CARDIOVERSION N/A 02/24/2020   Procedure: CARDIOVERSION;  Surgeon: Werner Lean, MD;  Location: [place];  Service: Cardiovascular;  Laterality: N/A;   COLONOSCOPY     [place]   COLONOSCOPY WITH PROPOFOL N/A 01/08/2021   Procedure: COLONOSCOPY WITH PROPOFOL;  Surgeon: Milus Banister, MD;  Location: [facility] ENDOSCOPY;  Service: Endoscopy;  Laterality: N/A;   ESOPHAGOGASTRODUODENOSCOPY (EGD) WITH PROPOFOL N/A 02/28/2019   Procedure: ESOPHAGOGASTRODUODENOSCOPY (EGD) WITH PROPOFOL;  Surgeon: Milus Banister, MD;  Location: [facility] ENDOSCOPY;  Service: Endoscopy;  Laterality: N/A;   FRACTURE SURGERY     HIP ARTHROPLASTY Left 04/01/2016   Procedure: ARTHROPLASTY BIPOLAR HIP (HEMIARTHROPLASTY);  Surgeon: Paralee Cancel, MD;  Location: [place];  Service: Orthopedics;  Laterality: Left;   HIP CLOSED REDUCTION Left 04/30/2016   Procedure: CLOSED REDUCTION HIP;  Surgeon: Wylene Simmer, MD;  Location:  [place] [facility];  Service: Orthopedics;  Laterality: Left;   HOT HEMOSTASIS N/A 01/08/2021   Procedure: HOT HEMOSTASIS (ARGON PLASMA COAGULATION/BICAP);  Surgeon: Milus Banister, MD;  Location: [facility] ENDOSCOPY;  Service: Endoscopy;  Laterality: N/A;   IR RADIOLOGIST EVAL & MGMT  12/31/2020   JOINT REPLACEMENT     TONSILLECTOMY     TOTAL HIP ARTHROPLASTY Right 10/15/2019   Procedure: TOTAL HIP ARTHROPLASTY ANTERIOR APPROACH;  Surgeon: Paralee Cancel, MD;  Location: [facility];  Service: Orthopedics;  Laterality: Right;  70 mins   VASCULAR SURGERY      Social History:  reports that he has quit smoking. His smoking use included cigarettes. He has a 20.00 pack-year  smoking history. He has never used smokeless tobacco. He reports that he does not currently use alcohol after a past usage of about 3.0 standard drinks of alcohol per week. He reports that he does not use drugs.   Allergies  Allergen Reactions   Penicillin G Sodium Other (See Comments)    Other reaction(s): rash Other reaction(s): rash (tolerates Keflex)   Penicillins Rash    Family History  Problem Relation Age of Onset   Heart disease Mother 49   Hypertension Mother    Arthritis Mother    Heart failure Father 16   Stroke Maternal Aunt    Heart failure Sister    Colon cancer Neg Hx    Esophageal cancer Neg Hx    Stomach cancer Neg Hx    Rectal cancer Neg Hx     Family history reviewed and not pertinent    Prior to Admission medications   Medication Sig Start Date End Date Taking? Authorizing Provider  acetaminophen (TYLENOL) 325 MG tablet Take 2 tablets (650 mg total) by mouth every 4 (four) hours as needed for headache or mild pain. Patient taking differently: Take 325-650 mg by mouth every 4 (four) hours as needed for headache. 03/10/20   Angiulli, Lavon Paganini, PA-C  albuterol (PROVENTIL) (2.5 MG/3ML) 0.083% nebulizer solution Take 3 mLs (2.5 mg total) by nebulization every 6 (six) hours as needed for wheezing or shortness of breath. Patient not taking: Reported on 09/29/2021 06/14/21   Bonnielee Haff, MD  amiodarone (PACERONE) 200 MG tablet TAKE 1 TABLET BY MOUTH ONCE DAILY Patient taking differently: Take 200 mg by mouth every morning. 05/17/21   Richardson Dopp T, PA-C  atorvastatin (LIPITOR) 20 MG tablet Take 1 tablet (20 mg total) by mouth daily. Patient not taking: Reported on 11/23/2021 03/10/20   Angiulli, Lavon Paganini, PA-C  carvedilol (COREG) 6.25 MG tablet Take 1 tablet (6.25 mg total) by mouth 2 (two) times daily. 02/11/21   Sherren Mocha, MD  cetirizine (ZYRTEC) 10 MG tablet Take 10 mg by mouth daily. Patient not taking: Reported on 06/08/2021    [provider]   escitalopram (LEXAPRO) 10 MG tablet Take 1 tablet (10 mg total) by mouth daily. Patient taking differently: Take 10 mg by mouth every morning. 03/10/20   Angiulli, Lavon Paganini, PA-C  ferrous sulfate 324 MG TBEC Take 324 mg by mouth every morning.    [provider]  FOLIC ACID PO Take 1 tablet by mouth every morning.    [provider]  Magnesium Oxide 400 MG CAPS Take 1 capsule (400 mg total) by mouth daily. 10/17/20   Eugenie Filler, MD  Multiple Vitamins-Minerals (CENTRUM SILVER 50+MEN) TABS Take 1 tablet by mouth every other day.    [provider]  nitroGLYCERIN (NITROSTAT) 0.4 MG SL tablet  Place 1 tablet (0.4 mg total) under the tongue every 5 (five) minutes x 3 doses as needed for chest pain. 07/28/20   Baldwin Jamaica, PA-C  pantoprazole (PROTONIX) 40 MG tablet Take 1 tablet (40 mg total) by mouth daily. Patient taking differently: Take 40 mg by mouth every morning. 03/10/20   Angiulli, Lavon Paganini, PA-C  traZODone (DESYREL) 50 MG tablet Take 1 tablet (50 mg total) by mouth at bedtime. Patient taking differently: Take 50 mg by mouth at bedtime as needed for sleep. 03/10/20   Angiulli, Lavon Paganini, PA-C  vitamin B-12 (CYANOCOBALAMIN) 500 MCG tablet Take 1 tablet (500 mcg total) by mouth daily. Patient not taking: Reported on 09/29/2021 06/11/21   Bonnielee Haff, MD     Objective    Physical Exam: Vitals:   11/24/21 0230 11/24/21 0300 11/24/21 0352 11/24/21 0442  BP: (!) 144/60 (!) 155/66  (!) 156/71  Pulse: 63 64  69  Resp: (!) 22 18  19   Temp:   98.5 F (36.9 C) 98.2 F (36.8 C)  TempSrc:    Oral  SpO2: 95% 91%  99%  Weight:      Height:        General: appears to be stated age; alert, oriented Skin: warm, dry, no rash Head:  AT/[place] Mouth:  Oral mucosa membranes appear dry, normal dentition Neck: supple; trachea midline Heart:  RRR; did not appreciate any M/R/G Lungs: CTAB, did not appreciate any wheezes, rales, or rhonchi Abdomen: + BS; soft, ND,  NT Vascular: 2+ pedal pulses b/l; 2+ radial pulses b/l Extremities: no peripheral edema, no muscle wasting Neuro: strength and sensation intact in upper and lower extremities b/l   Labs on Admission: I have personally reviewed following labs and imaging studies  CBC: Recent Labs  Lab 11/19/21 1236 11/20/21 0726 11/23/21 1925 11/23/21 2024 11/23/21 2040  WBC 3.6* 3.5* 3.5*  --   --   NEUTROABS  --   --  1.7  --   --   HGB 8.4* 8.3* 7.7* 7.1* 7.5*  HCT 24.0* 25.2* 23.4* 21.0* 22.0*  MCV 99.6 102.9* 105.9*  --   --   PLT 129* 121* 125*  --   --    Basic Metabolic Panel: Recent Labs  Lab 11/19/21 1236 11/20/21 0726 11/23/21 1925 11/23/21 2024 11/23/21 2040  NA 130* 134* 131* 130* 130*  K 4.2 3.6 4.3 4.2 4.2  CL 90* 96* 94* 93* 93*  CO2 25 26 23   --   --   GLUCOSE 86 115* 72 74 75  BUN 26* 14 18 16 17   CREATININE 3.02* 2.01* 2.83* 2.90* 2.90*  CALCIUM 8.9 8.0* 8.5*  --   --   MG  --   --  1.9  --   --    GFR: Estimated Creatinine Clearance: 22.3 mL/min (A) (by C-G formula based on SCr of 2.9 mg/dL (H)). Liver Function Tests: Recent Labs  Lab 11/20/21 0726 11/23/21 1925  AST 32 33  ALT 19 20  ALKPHOS 114 126  BILITOT 1.6* 1.2  PROT 5.1* 5.6*  ALBUMIN 3.1* 3.3*   No results for input(s): "LIPASE", "AMYLASE" in the last 168 hours. No results for input(s): "AMMONIA" in the last 168 hours. Coagulation Profile: Recent Labs  Lab 11/23/21 1925  INR 1.1   Cardiac Enzymes: No results for input(s): "CKTOTAL", "CKMB", "CKMBINDEX", "TROPONINI" in the last 168 hours. BNP (last 3 results) No results for input(s): "PROBNP" in the last 8760 hours. HbA1C: No results  for input(s): "HGBA1C" in the last 72 hours. CBG: No results for input(s): "GLUCAP" in the last 168 hours. Lipid Profile: No results for input(s): "CHOL", "HDL", "LDLCALC", "TRIG", "CHOLHDL", "LDLDIRECT" in the last 72 hours. Thyroid Function Tests: Recent Labs    11/23/21 1925  TSH 2.411   Anemia  Panel: Recent Labs    11/23/21 1925  VITAMINB12 633  FOLATE 21.1   Urine analysis:    Component Value Date/Time   COLORURINE YELLOW 11/23/2021 2244   APPEARANCEUR CLEAR 11/23/2021 2244   LABSPEC 1.004 (L) 11/23/2021 2244   PHURINE 9.0 (H) 11/23/2021 2244   GLUCOSEU NEGATIVE 11/23/2021 2244   HGBUR MODERATE (A) 11/23/2021 2244   [place] NEGATIVE 11/23/2021 2244   KETONESUR NEGATIVE 11/23/2021 2244   PROTEINUR NEGATIVE 11/23/2021 2244   NITRITE NEGATIVE 11/23/2021 2244   LEUKOCYTESUR MODERATE (A) 11/23/2021 2244    Radiological Exams on Admission: DG Chest Port 1 View  Result Date: 11/23/2021 CLINICAL DATA:  Weakness, lightheadedness, near syncope EXAM: PORTABLE CHEST 1 VIEW COMPARISON:  09/27/2021 FINDINGS: Single frontal view of the chest demonstrates an unremarkable cardiac silhouette. No acute airspace disease, effusion, or pneumothorax. No acute bony abnormality. IMPRESSION: 1. No acute intrathoracic process. Electronically Signed   By: Randa Ngo M.D.   On: 11/23/2021 19:55     EKG: Independently reviewed, with result as described above.    Assessment/Plan   Principal Problem:   Generalized weakness Active Problems:   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HCC)   Paroxysmal atrial fibrillation (HCC)   GERD (gastroesophageal reflux disease)   Acute on chronic anemia   Chronic hyponatremia   History of hypertension   Chronic diastolic CHF (congestive heart failure) (HCC)   Pancytopenia (HCC)   UTI (urinary tract infection)   Severe sepsis (HCC)   Lactic acidosis      #) Generalized weakness: 2 days of progressive generalized weakness or resulting in significant decline to perform ADLs relative to his baseline which he lives by self and able to independently perform all of his own ADLs.  This appears to be multifactorial in nature, with suspected contributions from presenting severe sepsis due to urinary tract infection as well as acute on chronic anemia  superimposed on chronic pancytopenia.  No evidence of additional underlying factious process at this time.  Of note, TSH found to be within normal limits in the ED today.  will also check CPK level given concomitant lactic acidosis.  No evidence of any acute focal neurologic deficit to raise index of suspicion for acute stroke.  Plan: Further evaluation management of presenting severe sepsis due to urinary tract infection, as further detailed below.  Gentle IV fluids, with repeat lactate in the morning.  Fall precautions ordered.  PT/OT consults ordered for the morning.  Add on CPK level.  Repeat CMP/CBC in the morning.  Further evaluation and management of acute on chronic anemia, as further detailed below.         #) Severe sepsis due to urinary tract infection: In the setting of 2 days of generalized weakness, urinalysis appears to be suggestive of UTI in the setting of significant pyuria, moderate leukocyte esterase, no evidence of squamous epithelial cells.   SIRS criteria met via leukopenia and tachypnea. Lactic acid level: 3.0, with repeat value trending down to 2.8. Of note, given the associated presence of suspected end organ damage in the form of concominant presenting elevated lactate, criteria are met for pt's sepsis to be considered severe in nature. However, in the  absence of lactic acid level that is greater than or equal to 4.0, and in the absence of any associated hypotension refractory to IVF's, there are no indications for administration of a 30 mL/kg IVF bolus at this time.   Additional ED work-up/management notable for: Collection of blood cultures x2 as well as urine culture followed by initiation of Rocephin, which will be continued for empiric coverage of UTI.  No e/o additional infectious process at this time, including chest x-ray which showed no evidence of acute cardiopulmonary process.   Plan: CBC w/ diff and CMP in AM.  Follow for results of blood cx's x 2 as well as  urine culture. Abx: Continue Rocephin.  Continuous IV fluids, with repeat lactate ordered for the morning.  Monitor strict I's and O's Daily weights.  Add on CPK level.        #(Lactic acidosis: As quantified above.  Suspected contribution from severe sepsis, as above, however, potential additional contributions include history of end-stage renal disease, and will also evaluate CPK level given concomitant presenting generalized weakness, as above.  There may also be a relative decline in generalized tissue perfusion in the setting of acute on chronic anemia, as further detailed above.  Of note, INR within normal limits.  Plan: Further evaluation and management of severe sepsis due to UTI, including general IV fluids and repeat lactic CPK level.  CMP/CBC in the morning.        #) Acute anemia superimposed on anemia of chronic disease: Relative to the patient's baseline hemoglobin range of 8-11, presenting hemoglobin found to be slightly lower at 7.7.  In the setting of macrocytic finding, with normal folic acid ZOXWR/U04 level, and normal INR, in the absence of any recent alcohol abuse, differential also includes possibility of myelodysplastic syndrome, including in the setting of his history of pancytopenia.  Will further assess with peripheral smear via technologist smear review ordered.  Of note, presenting anemia also associated with mildly elevated RDW, which in the context of finding a few oval blood positive result per her EDPs DRE today, raises the possibility of a potential additional contribution from very mild acute blood loss anemia.  This is in the context of most recent colonoscopy occurring in April 2019.  There does not appear to be any urgent need for colonoscopy as an inpatient, other consideration could be given to that as an outpatient.   Plan: Add on peripheral smear.  Add on iron studies, reticulocyte count.  Repeat CBC in the morning.  Type and screen  ordered.           #) ESRD: on HD (schedule: Monday, Wednesday, Friday).  Most recent hemodialysis session.  He will be due for 6 scheduled hemodialysis session on the morning of 7/19.  No evidence of factors that would warrant urgent overnight hemodialysis.  As noted above, patient does continue to produce a urine.   Plan: monitor strict I's/O's, daily weights. CMP in the AM. Check mag and phos levels.  Anticipate consultation of nephrology in the morning for assistance with arrangement for nonemergent hemodialysis.              #) Chronic diastolic heart failure: documented history of such, with most recent echocardiogram performed in September 2022 notable for LV EF 65% as well as indeterminate diastolic parameters, while echocardiogram in May 2022 was notable for grade 1 diastolic dysfunction. No clinical evidence to suggest acutely decompensated heart failure at this time. home diuretic regimen reportedly consists of the  following: None.  Will noting that the patient continues to produce urine, will closely monitor for ensuing evidence of volume overload given plan for gentle overnight IV fluids in the setting of lactic acidosis, while acknowledging that the patient is due for his next scheduled hemodialysis session in the morning as well.   Plan: monitor strict I's & O's and daily weights. Repeat BMP in AM. Check serum mag level.           #) Paroxysmal atrial fibrillation: Documented history of such. In setting of CHA2DS2-VASc score of 6, there is an indication for chronic anticoagulation for thromboembolic prophylaxis.  However, in the context of the patient's documented history of recurrent falls, he is not on any blood thinning agents as an outpatient, including no formal anticoagulation.  Home AV nodal blocking regimen: None.  Rather, rhythm control strategy pursued via outpatient amiodarone.  Most recent echocardiogram occurred in September 2022, as further  detailed above.. Presenting EKG showed sinus rhythm without overt evidence of acute ischemic changes..      Plan: monitor strict I's & O's and daily weights. Repeat CMP/CBC in AM. Check serum mag level. Continue home amiodarone.          #) Essential Hypertension: documented h/o such, with outpatient antihypertensive regimen including Coreg.  SBP's in the ED today: Normotensive.  Plan: Close monitoring of subsequent BP via routine VS. continue home Coreg.          #) Chronic hypoosmolar hyponatremia: Documented history of such, with baseline serum sodium range of 130-134 dating back approximately 3 years, with presenting serum sodium level found to be consistent with this range.  Notable outpatient medications include Lexapro.  Plan: Monitor strict I's and O's and daily weights.  Repeat CMP in the morning.         #) Depression: Documented history of such, on Lexapro as an outpatient.  Of note, EKG shows no evidence of QTc prolongation, while there was a level at baseline.  No contraindication to continuation of Lexapro at this time.  Plan: Continue home Lexapro.          #) GERD: documented h/o such; on Protonix as outpatient.   Plan: continue home PPI.         DVT prophylaxis: SCD's   Code Status: DNR/DNI, as confirmed per my discussions with the patient this evening Family Communication: none Disposition Plan: Per Rounding Team Consults called: none;  Admission status: obs    PLEASE NOTE THAT DRAGON DICTATION SOFTWARE WAS USED IN THE CONSTRUCTION OF THIS NOTE.   [place] DO [facility]  From [place]   11/24/2021, 4:59 AM

## 2021-11-24 NOTE — ED Notes (Signed)
Pt appears to be sleeping, even RR and unlabored, NAD noted, call bell in reach, side rails up x2 for safety, care on going, will continue to monitor. 

## 2021-11-24 NOTE — Procedures (Signed)
HD Note  The documentation in the Hemodialysis flow sheets was gathered at the time stated although may have been entered later due to patient care needs.

## 2021-11-24 NOTE — Plan of Care (Signed)
  Problem: Activity: Goal: Risk for activity intolerance will decrease Outcome: Progressing   Problem: Pain Managment: Goal: General experience of comfort will improve Outcome: Progressing   

## 2021-11-24 NOTE — ED Notes (Signed)
Purple DNR bracelet applied to pt's right wrist Pink LIMB ALERT bracelet applied to pt's left wrist

## 2021-11-25 DIAGNOSIS — R531 Weakness: Secondary | ICD-10-CM | POA: Diagnosis not present

## 2021-11-25 DIAGNOSIS — D61818 Other pancytopenia: Secondary | ICD-10-CM | POA: Diagnosis not present

## 2021-11-25 DIAGNOSIS — E872 Acidosis, unspecified: Secondary | ICD-10-CM | POA: Diagnosis not present

## 2021-11-25 DIAGNOSIS — N3000 Acute cystitis without hematuria: Secondary | ICD-10-CM | POA: Diagnosis not present

## 2021-11-25 DIAGNOSIS — I5032 Chronic diastolic (congestive) heart failure: Secondary | ICD-10-CM | POA: Diagnosis not present

## 2021-11-25 DIAGNOSIS — I480 Paroxysmal atrial fibrillation: Secondary | ICD-10-CM | POA: Diagnosis not present

## 2021-11-25 DIAGNOSIS — R6520 Severe sepsis without septic shock: Secondary | ICD-10-CM | POA: Diagnosis not present

## 2021-11-25 DIAGNOSIS — E871 Hypo-osmolality and hyponatremia: Secondary | ICD-10-CM | POA: Diagnosis not present

## 2021-11-25 DIAGNOSIS — D649 Anemia, unspecified: Secondary | ICD-10-CM | POA: Diagnosis not present

## 2021-11-25 DIAGNOSIS — N186 End stage renal disease: Secondary | ICD-10-CM | POA: Diagnosis not present

## 2021-11-25 DIAGNOSIS — A419 Sepsis, unspecified organism: Secondary | ICD-10-CM | POA: Diagnosis not present

## 2021-11-25 LAB — CBC WITH DIFFERENTIAL/PLATELET
Abs Immature Granulocytes: 0.01 10*3/uL (ref 0.00–0.07)
Basophils Absolute: 0 10*3/uL (ref 0.0–0.1)
Basophils Relative: 1 %
Eosinophils Absolute: 0 10*3/uL (ref 0.0–0.5)
Eosinophils Relative: 0 %
HCT: 21.7 % — ABNORMAL LOW (ref 39.0–52.0)
Hemoglobin: 7.5 g/dL — ABNORMAL LOW (ref 13.0–17.0)
Immature Granulocytes: 0 %
Lymphocytes Relative: 21 %
Lymphs Abs: 0.7 10*3/uL (ref 0.7–4.0)
MCH: 35.2 pg — ABNORMAL HIGH (ref 26.0–34.0)
MCHC: 34.6 g/dL (ref 30.0–36.0)
MCV: 101.9 fL — ABNORMAL HIGH (ref 80.0–100.0)
Monocytes Absolute: 0.4 10*3/uL (ref 0.1–1.0)
Monocytes Relative: 13 %
Neutro Abs: 2.1 10*3/uL (ref 1.7–7.7)
Neutrophils Relative %: 65 %
Platelets: 120 10*3/uL — ABNORMAL LOW (ref 150–400)
RBC: 2.13 MIL/uL — ABNORMAL LOW (ref 4.22–5.81)
RDW: 17.3 % — ABNORMAL HIGH (ref 11.5–15.5)
WBC: 3.2 10*3/uL — ABNORMAL LOW (ref 4.0–10.5)
nRBC: 0 % (ref 0.0–0.2)

## 2021-11-25 LAB — COMPREHENSIVE METABOLIC PANEL
ALT: 16 U/L (ref 0–44)
AST: 28 U/L (ref 15–41)
Albumin: 2.6 g/dL — ABNORMAL LOW (ref 3.5–5.0)
Alkaline Phosphatase: 117 U/L (ref 38–126)
Anion gap: 4 — ABNORMAL LOW (ref 5–15)
BUN: 15 mg/dL (ref 8–23)
CO2: 27 mmol/L (ref 22–32)
Calcium: 8.1 mg/dL — ABNORMAL LOW (ref 8.9–10.3)
Chloride: 104 mmol/L (ref 98–111)
Creatinine, Ser: 2.49 mg/dL — ABNORMAL HIGH (ref 0.61–1.24)
GFR, Estimated: 25 mL/min — ABNORMAL LOW (ref >60)
Glucose, Bld: 94 mg/dL (ref 70–99)
Potassium: 3.5 mmol/L (ref 3.5–5.1)
Sodium: 135 mmol/L (ref 135–145)
Total Bilirubin: 1.2 mg/dL (ref 0.3–1.2)
Total Protein: 4.6 g/dL — ABNORMAL LOW (ref 6.5–8.1)

## 2021-11-25 LAB — HEPATITIS B SURFACE ANTIBODY, QUANTITATIVE: Hep B S AB Quant (Post): 36.2 m[IU]/mL (ref >9.9)

## 2021-11-25 NOTE — Consult Note (Addendum)
   [facility] Inpatient Consult   11/25/2021  Darryl Diaz 06/05/1940 [id number]  [facility] [ACO] Patient: Medicare ACO REACH  Primary Care Provider:  [name], MD, with [facility] an [place] provider  9:20 Discussed in progression for barriers to care and post hospital needs patient is currently in observation status at the time of the review  Patient screened for hospitalizations with multiple ED visits 6 in the past 6 months. noted to assess for potential [facility] service needs for post hospital transition.  Review of patient's medical record reveals patient is awaiting PT/OT evals, lives alone, SDOH reviewed and recently updated.    Addendum:  Recently, last month transitioned from [place] back home [place] attempts to reach were unsuccessful.  Plan:  Continue to follow progress and disposition to assess for post hospital care management needs.    For questions contact:   Natividad Brood, RN BSN [facility] Liaison  [phone] business mobile phone Toll free office [phone]  Fax number: [phone] [email or web address]      .

## 2021-11-25 NOTE — Evaluation (Signed)
Occupational Therapy Evaluation Patient Details Name: Darryl Diaz MRN: [id number] DOB: December 01, 1940 Today's Date: 11/25/2021   History of Present Illness Pt is an 81 y/o male admitted secondary to weakness and near syncope. PMH includes a fib, dCHF, HTN, ESRD on HD, MDD, and alcohol abuse.   Clinical Impression   Pt is recently home from [facility] - he moblizes with Rollator and has a shower chair for bathing. He reports that he does his own IADL and ADL - even on dialysis days. He has "comfort keepers" who assist him with errands, and some minor housework. He has frequent falls and "I don't know why they keep happening". Today his was min A for sit<>stand transfers, min guard A with RW in room for mobility. Able to sit at sink and perform sponge bath and grooming tasks - he normally performs in seated for safety. Pt required mod A for LB dressing, and will benefit from skilled OT in the acute setting as well as afterwards at the [facility] level - with focus on fall prevention, IADL, increased activity tolerance. Pt presents as very high fall risk, educated on SNF and Pt only wants to go home. Hopefully we can maximize [facility] services.      Recommendations for follow up therapy are one component of a multi-disciplinary discharge planning process, led by the attending physician.  Recommendations may be updated based on patient status, additional functional criteria and insurance authorization.   Follow Up Recommendations  Home health OT    Assistance Recommended at Discharge Intermittent Supervision/Assistance  Patient can return home with the following A little help with walking and/or transfers;A lot of help with bathing/dressing/bathroom;Assistance with cooking/housework;Assist for transportation    Functional Status Assessment  Patient has had a recent decline in their functional status and demonstrates the ability to make significant improvements in function in a reasonable and predictable  amount of time.  Equipment Recommendations  None recommended by OT (Pt has appropriate DME at home)    Recommendations for Other Services PT consult     Precautions / Restrictions Precautions Precautions: Fall Precaution Comments: falls frequently Restrictions Weight Bearing Restrictions: No      Mobility Bed Mobility Overal bed mobility: Needs Assistance Bed Mobility: Supine to Sit, Sit to Supine     Supine to sit: Supervision, HOB elevated Sit to supine: Supervision   General bed mobility comments: Supervision for safety. Use of bed rails    Transfers Overall transfer level: Needs assistance Equipment used: Rolling walker (2 wheels) Transfers: Sit to/from Stand Sit to Stand: Min assist           General transfer comment: mutiple attempts with rocking momentum, on 3rd attempt min A from therapist for boost for success      Balance Overall balance assessment: Needs assistance Sitting-balance support: No upper extremity supported, Feet supported Sitting balance-Leahy Scale: Fair     Standing balance support: Bilateral upper extremity supported Standing balance-Leahy Scale: Poor Standing balance comment: Reliant on UE support                           ADL either performed or assessed with clinical judgement   ADL Overall ADL's : Needs assistance/impaired Eating/Feeding: Modified independent   Grooming: Wash/dry hands;Wash/dry face;Oral care;Brushing hair;Applying deodorant;Sitting;Set up Grooming Details (indicate cue type and reason): BSC at sink Upper Body Bathing: Minimal assistance;Sitting   Lower Body Bathing: Minimal assistance;Sitting/lateral leans Lower Body Bathing Details (indicate cue type and  reason): knees down Upper Body Dressing : Min guard;Sitting   Lower Body Dressing: Moderate assistance;Sit to/from stand Lower Body Dressing Details (indicate cue type and reason): assist to don shoes Toilet Transfer: Minimal  assistance;Ambulation;Rolling walker (2 wheels);BSC/3in1 Toilet Transfer Details (indicate cue type and reason): wide and tall BSC at sink Toileting- Clothing Manipulation and Hygiene: Maximal assistance;Sit to/from stand Toileting - Clothing Manipulation Details (indicate cue type and reason): Pt able to come to standing, assist for thoroughness due to BM     Functional mobility during ADLs: Minimal assistance;Rolling walker (2 wheels) General ADL Comments: decreased activity tolerance, decreased safety awareness, assist for LB ADL     Vision Baseline Vision/History: 1 Wears glasses       Perception     Praxis      Pertinent Vitals/Pain Pain Assessment Pain Assessment: No/denies pain     Hand Dominance Right   Extremity/Trunk Assessment Upper Extremity Assessment Upper Extremity Assessment: Generalized weakness   Lower Extremity Assessment Lower Extremity Assessment: Defer to PT evaluation   Cervical / Trunk Assessment Cervical / Trunk Assessment: Kyphotic   Communication Communication Communication: No difficulties   Cognition Arousal/Alertness: Awake/alert Behavior During Therapy: Impulsive Overall Cognitive Status: History of cognitive impairments - at baseline                                 General Comments: Impulsive at times, hx of alcohol abuse, decreased safety awareness at baseline. Values independence over safety - his children have talked to him, his sister has talked to him - it is his top priority that he is allowed to live independently and not in a facility     General Comments       Exercises     Shoulder Instructions      Home Living Family/patient expects to be discharged to:: Private residence Living Arrangements: Alone Available Help at Discharge: Friend(s);Available PRN/intermittently Type of Home: Other(Comment) (townhome) Home Access: Level entry     Home Layout: One level     Bathroom Shower/Tub: Medical illustrator: Standard Bathroom Accessibility: Yes How Accessible: Accessible via walker Home Equipment: Rolling Walker (2 wheels);Rollator (4 wheels);Shower seat;Grab bars - tub/shower   Additional Comments: Pt reports they have an aide come in about 3 hours to assist for gathering household items and assist as needed but not they call as needed. Pt reports they were still using as they were still unpacking house.      Prior Functioning/Environment Prior Level of Function : History of Falls (last six months);Needs assist             Mobility Comments: Uses rollator for mobility ADLs Comments: Comfort keepers assists with transportation to store. Has transportation to medical appointments and dialysis. Independent with ADLS does his own cooking        OT Problem List: Decreased activity tolerance;Impaired balance (sitting and/or standing);Decreased safety awareness      OT Treatment/Interventions: Self-care/ADL training;Energy conservation;DME and/or AE instruction;Therapeutic activities;Patient/family education;Balance training    OT Goals(Current goals can be found in the care plan section) Acute Rehab OT Goals Patient Stated Goal: to stay at his home and not go to SNF OT Goal Formulation: With patient Time For Goal Achievement: 12/16/21 Potential to Achieve Goals: Fair ADL Goals Pt Will Perform Grooming: with modified independence;sitting Pt Will Perform Upper Body Dressing: with modified independence;sitting Pt Will Perform Lower Body Dressing: with supervision;sit to/from stand  Pt Will Transfer to Toilet: with modified independence;ambulating Pt Will Perform Toileting - Clothing Manipulation and hygiene: with modified independence;sit to/from stand Pt/caregiver will Perform Home Exercise Program: Both right and left upper extremity;Increased strength;With theraband;With written HEP provided;Independently Additional ADL Goal #1: Pt will verbalize 3 strategies for  energy conservation during ADL/IADL with no cues  OT Frequency: Min 2X/week    Co-evaluation              AM-PAC OT "6 Clicks" Daily Activity     Outcome Measure Help from another person eating meals?: None Help from another person taking care of personal grooming?: A Little Help from another person toileting, which includes using toliet, bedpan, or urinal?: A Lot Help from another person bathing (including washing, rinsing, drying)?: A Lot Help from another person to put on and taking off regular upper body clothing?: A Little Help from another person to put on and taking off regular lower body clothing?: A Lot 6 Click Score: 16   End of Session Equipment Utilized During Treatment: Gait belt;Rolling walker (2 wheels);Other (comment) (shoes in room) Nurse Communication: Mobility status;Precautions  Activity Tolerance: Patient tolerated treatment well Patient left: in chair;with chair alarm set;with call bell/phone within reach  OT Visit Diagnosis: Unsteadiness on feet (R26.81);Muscle weakness (generalized) (M62.81);Repeated falls (R29.6);History of falling (Z91.81);Other symptoms and signs involving cognitive function;Adult, failure to thrive (R62.7)                Time: 2395-3202 OT Time Calculation (min): 34 min Charges:  OT General Charges $OT Visit: 1 Visit OT Evaluation $OT Eval Moderate Complexity: 1 Mod OT Treatments $Self Care/Home Management : 8-22 mins  Jesse Sans OTR/L Acute Rehabilitation Services Office: [place] 11/25/2021, 2:32 PM

## 2021-11-25 NOTE — Progress Notes (Signed)
PROGRESS NOTE  Darryl Diaz NUU:[id number] DOB: [birth year] DOA: 11/23/2021 PCP: [name], MD  HPI/Recap of past 24 hours: Darryl Diaz is a 81 y.o. male with medical history significant for end-stage renal disease on hemodialysis on Monday, Wednesday, Friday, paroxysmal atrial fibrillation not chronically anticoagulated in the setting of recurrent falls, essential hypertension, pancytopenia, chronic diastolic heart failure, chronic hyponatremia with baseline serum sodium level 130-134, who is admitted to [facility] on 11/23/2021 with generalized weakness.  Labs notable for pancytopenia, with possible UTI.  Patient admitted for severe sepsis likely due to UTI, acute on chronic anemia with chronic pancytopenia.    Today, patient denies any new complaints.    Assessment/Plan: Principal Problem:   Generalized weakness Active Problems:   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HCC)   Paroxysmal atrial fibrillation (HCC)   GERD (gastroesophageal reflux disease)   Acute on chronic anemia   Chronic hyponatremia   History of hypertension   Chronic diastolic CHF (congestive heart failure) (HCC)   Pancytopenia (HCC)   UTI (urinary tract infection)   Severe sepsis (HCC)   Lactic acidosis   Generalized weakness Unknown etiology, multifactorial with likely 2/2 severe sepsis 2/2 UTI CK pending Further management for UTI PT/OT   Severe sepsis likely due to urinary tract infection Tachypnea, leukopenia, LA 3 on admission, trended back to WNL UA with pyuria, moderate leukocyte esterase, no evidence of squamous epithelial cells UC showed >100, 000 Enterococcus faecalis, awaiting susceptibilities BC x2 NGTD Chest x-ray unremarkable Continue IV Rocephin for now DC IV fluid Monitor closely   Acute on chronic anemia of chronic disease Chronic pancytopenia Noted positive FOBT Baseline hemoglobin 9-11, presenting hemoglobin found to be slightly lower at 7.7 Normal folic  acid HKVQQ/V95 level, normal INR Peripheral smear via technologist smear review showed normal morphology Repeat anemia panel showed iron 128, sats 58, ferritin 982 Type and screen done Daily CBC GI consultation as an outpatient Heme-onc for further work-up for chronic pancytopenia Follow-up with PCP  ESRD-Oliguric On HD M/W/F Neurology consulted Further management per nephrology   Chronic diastolic heart failure Appears euvolemic Echocardiogram performed in September 2022 notable for LV EF 65% as well as indeterminate diastolic parameters Strict I's and O's, daily weights   Paroxysmal atrial fibrillation CHA2DS2-VASc score of 6, not on AC due to recurrent falls  Continue amiodarone  Telemetry   Essential Hypertension BP stable Continue home Coreg     Depression Continue Lexapro    GERD Continue PPI     Estimated body mass index is 26.28 kg/m as calculated from the following:   Height as of this encounter: 6' (1.829 m).   Weight as of this encounter: 87.9 kg.     Code Status: DNR  Family Communication: None at bedside  Disposition Plan: Status is: Observation The patient will require care spanning > 2 midnights and should be moved to inpatient because: Level of care   Consultants: Nephrology  Procedures: None  Antimicrobials: Ceftriaxone  DVT prophylaxis: SCD-noted FOBT positive, anemia   Objective: Vitals:   11/25/21 0433 11/25/21 0824 11/25/21 1141 11/25/21 1628  BP:  (!) 123/54 134/61   Pulse:  65 (!) 52   Resp:  '20 20 20  '$ Temp:  97.9 F (36.6 C) 98.5 F (36.9 C) 98.9 F (37.2 C)  TempSrc:  Oral Oral Oral  SpO2:  100% 99%   Weight: 87.9 kg     Height:        Intake/Output Summary (Last 24 hours)  at 11/25/2021 1847 Last data filed at 11/25/2021 0433 Gross per 24 hour  Intake 596.33 ml  Output 225 ml  Net 371.33 ml   Filed Weights   11/23/21 1906 11/24/21 1500 11/25/21 0433  Weight: 87.5 kg 86 kg 87.9 kg    Exam: General: NAD   Cardiovascular: S1, S2 present Respiratory: CTAB Abdomen: Soft, nontender, nondistended, bowel sounds present Musculoskeletal: No bilateral pedal edema noted Skin: Normal Psychiatry: Normal mood     Data Reviewed: CBC: Recent Labs  Lab 11/19/21 1236 11/20/21 0726 11/23/21 1925 11/23/21 2024 11/23/21 2040 11/24/21 0504 11/25/21 0244  WBC 3.6* 3.5* 3.5*  --   --  3.9* 3.2*  NEUTROABS  --   --  1.7  --   --  2.5 2.1  HGB 8.4* 8.3* 7.7* 7.1* 7.5* 7.8* 7.5*  HCT 24.0* 25.2* 23.4* 21.0* 22.0* 22.9* 21.7*  MCV 99.6 102.9* 105.9*  --   --  102.7* 101.9*  PLT 129* 121* 125*  --   --  129* 884*   Basic Metabolic Panel: Recent Labs  Lab 11/19/21 1236 11/20/21 0726 11/23/21 1925 11/23/21 2024 11/23/21 2040 11/24/21 0504 11/25/21 0340  NA 130* 134* 131* 130* 130* 134* 135  K 4.2 3.6 4.3 4.2 4.2 4.1 3.5  CL 90* 96* 94* 93* 93* 98 104  CO2 '25 26 23  '$ --   --  25 27  GLUCOSE 86 115* 72 74 75 76 94  BUN 26* '14 18 16 17 20 15  '$ CREATININE 3.02* 2.01* 2.83* 2.90* 2.90* 2.69* 2.49*  CALCIUM 8.9 8.0* 8.5*  --   --  8.4* 8.1*  MG  --   --  1.9  --   --  1.8  --   PHOS  --   --   --   --   --  3.1  --    GFR: Estimated Creatinine Clearance: 26 mL/min (A) (by C-G formula based on SCr of 2.49 mg/dL (H)). Liver Function Tests: Recent Labs  Lab 11/20/21 0726 11/23/21 1925 11/24/21 0504 11/25/21 0340  AST 32 33 28 28  ALT '19 20 18 16  '$ ALKPHOS 114 126 114 117  BILITOT 1.6* 1.2 1.8* 1.2  PROT 5.1* 5.6* 5.0* 4.6*  ALBUMIN 3.1* 3.3* 2.8* 2.6*   No results for input(s): "LIPASE", "AMYLASE" in the last 168 hours. No results for input(s): "AMMONIA" in the last 168 hours. Coagulation Profile: Recent Labs  Lab 11/23/21 1925  INR 1.1   Cardiac Enzymes: Recent Labs  Lab 11/24/21 0532  CKTOTAL 35*   BNP (last 3 results) No results for input(s): "PROBNP" in the last 8760 hours. HbA1C: No results for input(s): "HGBA1C" in the last 72 hours. CBG: No results for input(s):  "GLUCAP" in the last 168 hours. Lipid Profile: No results for input(s): "CHOL", "HDL", "LDLCALC", "TRIG", "CHOLHDL", "LDLDIRECT" in the last 72 hours. Thyroid Function Tests: Recent Labs    11/23/21 1925  TSH 2.411   Anemia Panel: Recent Labs    11/23/21 1925 11/24/21 0532  VITAMINB12 633  --   FOLATE 21.1  --   FERRITIN  --  982*  TIBC  --  209*  IRON  --  120  RETICCTPCT  --  3.0   Urine analysis:    Component Value Date/Time   COLORURINE YELLOW 11/23/2021 2244   APPEARANCEUR CLEAR 11/23/2021 2244   LABSPEC 1.004 (L) 11/23/2021 2244   PHURINE 9.0 (H) 11/23/2021 2244   GLUCOSEU NEGATIVE 11/23/2021 2244   HGBUR MODERATE (  A) 11/23/2021 2244   BILIRUBINUR NEGATIVE 11/23/2021 2244   KETONESUR NEGATIVE 11/23/2021 2244   PROTEINUR NEGATIVE 11/23/2021 2244   NITRITE NEGATIVE 11/23/2021 2244   LEUKOCYTESUR MODERATE (A) 11/23/2021 2244   Sepsis Labs: '@LABRCNTIP'$ (procalcitonin:4,lacticidven:4)  ) Recent Results (from the past 240 hour(s))  Blood Culture (routine x 2)     Status: None (Preliminary result)   Collection Time: 11/23/21  7:27 PM   Specimen: BLOOD  Result Value Ref Range Status   Specimen Description BLOOD RIGHT ANTECUBITAL  Final   Special Requests   Final    BOTTLES DRAWN AEROBIC AND ANAEROBIC Blood Culture adequate volume   Culture   Final    NO GROWTH 2 DAYS Performed at [facility], [place] [address] [place], [place] [address]    Report Status PENDING  Incomplete  Blood Culture (routine x 2)     Status: None (Preliminary result)   Collection Time: 11/23/21  8:00 PM   Specimen: BLOOD RIGHT WRIST  Result Value Ref Range Status   Specimen Description BLOOD RIGHT WRIST  Final   Special Requests   Final    BOTTLES DRAWN AEROBIC AND ANAEROBIC Blood Culture adequate volume   Culture   Final    NO GROWTH 2 DAYS Performed at [facility], [place] [address], [place], [place] [address]    Report Status PENDING  Incomplete  Urine Culture     Status:  Abnormal (Preliminary result)   Collection Time: 11/23/21 10:44 PM   Specimen: Urine, Clean Catch  Result Value Ref Range Status   Specimen Description URINE, CLEAN CATCH  Final   Special Requests NONE  Final   Culture (A)  Final    >=100,000 COLONIES/mL ENTEROCOCCUS FAECALIS SUSCEPTIBILITIES TO FOLLOW Performed at [facility], [place] [address] [place],  [address]    Report Status PENDING  Incomplete      Studies: No results found.  Scheduled Meds:  amiodarone  200 mg Oral q morning   carvedilol  6.25 mg Oral BID   Chlorhexidine Gluconate Cloth  6 each Topical Q0600   darbepoetin (ARANESP) injection - DIALYSIS  100 mcg Intravenous Q Wed-HD   escitalopram  10 mg Oral q morning   ferrous sulfate  324 mg Oral q morning   folic acid  1 mg Oral q morning   pantoprazole  40 mg Oral q morning    Continuous Infusions:  sodium chloride Stopped (11/24/21 2300)   cefTRIAXone (ROCEPHIN)  IV Stopped (11/24/21 2153)     LOS: 0 days     Alma Friendly, MD [facility]  If 7PM-7AM, please contact night-coverage [email or web address] 11/25/2021, 6:47 PM

## 2021-11-25 NOTE — TOC Initial Note (Addendum)
Transition of Care [facility]) - Initial/Assessment Note    Patient Details  Name: Darryl Diaz MRN: [id number] Date of Birth: [birth year]  Transition of Care [facility]) CM/SW Contact:    Zenon Mayo, RN Phone Number: 11/25/2021, 2:33 PM  Clinical Narrative:                 NCM spoke with patient at the bedside, he lives at home alone, he has a sister lives in [place], she is retired and she is his support system, she is 79 yo, she still drives.  He states he has PCP - [place].  He has two rollators, a walker, and 3 canes at home. He states he has a good friend who lives right next to him that still drives as well.   He states the [facility] transportation  [phone] takes him to his MD apts and HD apts. He states he will be getting two grab bars in his shower and in the guest bedroom thru his home owners association.  He has an aide with [employer] that helps him with grocery shopping and what ever  other chores he needs.  He states  his sister will transport him home or he will call [employer] or the [place] transportation, but [place] transport will need 24 hr notice. NCM offered choice for [facility], he states he had [place] before and enjoyed them he would like to have them again.  NCM made referral to [facility] with [facility] for [facility], [place], [place].  He is able to take referral.  Soc will begin 24 to 48 hrs post dc. Patient  states he does not really want to go to a SNF .  He has clothes here and he has a key to get into his house.  TOC following.  Will need HH orders. [place] transport states they can not transport him tomorrow, because they already have their schedule set and he would have needed to call before 12 noon.  NCM contacted his sister Darryl Diaz, she states she has COVID right now so she can not transport him.  He may need ast with transport home at dc.    Expected Discharge Plan: [place] Barriers to Discharge: Continued Medical Work up   Patient  Goals and CMS Choice Patient states their goals for this hospitalization and ongoing recovery are:: return home with [facility] CMS Medicare.gov Compare Post Acute Care list provided to:: Patient Choice offered to / list presented to : Patient  Expected Discharge Plan and Services Expected Discharge Plan: [place]   Discharge Planning Services: CM Consult Post Acute Care Choice: [place] arrangements for the past 2 months: Single Family Home                   DME Agency: NA       HH Arranged: PT, OT, RN, Disease Management [place] Agency: [place] Date [facility] Agency Contacted: 11/25/21 Time HH Agency Contacted: [phone] Representative spoke with at [place]: Tommi Rumps  Prior Living Arrangements/Services Living arrangements for the past 2 months: [place] Lives with:: Self Patient language and need for interpreter reviewed:: Yes Do you feel safe going back to the place where you live?: Yes      Need for Family Participation in Patient Care: Yes (Comment) Care giver support system in place?: Yes (comment) Current home services: DME (rollators x 2, walker, 3 canes,) Criminal Activity/Legal Involvement Pertinent to Current  Situation/Hospitalization: No - Comment as needed  Activities of Daily Living Home Assistive Devices/Equipment: Wheelchair ADL Screening (condition at time of admission) Patient's cognitive ability adequate to safely complete daily activities?: Yes Is the patient deaf or have difficulty hearing?: No Does the patient have difficulty seeing, even when wearing glasses/contacts?: Yes Does the patient have difficulty concentrating, remembering, or making decisions?: No Patient able to express need for assistance with ADLs?: Yes Does the patient have difficulty dressing or bathing?: No Independently performs ADLs?: Yes (appropriate for developmental age) Does the patient have difficulty walking or climbing stairs?: Yes (use  walker) Weakness of Legs: Both Weakness of Arms/Hands: None  Permission Sought/Granted                  Emotional Assessment Appearance:: Appears stated age Attitude/Demeanor/Rapport: Engaged Affect (typically observed): Appropriate Orientation: : Oriented to Self, Oriented to Place, Oriented to  Time, Oriented to Situation Alcohol / Substance Use: Not Applicable Psych Involvement: No (comment)  Admission diagnosis:  Acute cystitis without hematuria [N30.00] Generalized weakness [R53.1] Low hemoglobin [D64.9] Patient Active Problem List   Diagnosis Date Noted   Generalized weakness 11/24/2021   Severe sepsis ([place]) 11/24/2021   Lactic acidosis 11/24/2021   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   Acute hyponatremia 09/27/2021   Incidental finding of COVID-19 virus infection 06/07/2021   Dependence on renal dialysis ([place]) 05/25/2021   Non-pressure chronic ulcer of other part of left foot with unspecified severity ([place]) 05/25/2021   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[place]) 05/25/2021   Osteomyelitis of great toe of left foot ([place])    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/13/2021   Cellulitis of right foot 01/17/2021   Normocytic anemia 01/17/2021   UTI (urinary tract infection) 01/17/2021   Acute GI bleeding 01/06/2021   Pancytopenia ([place]) 12/02/2020   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   Orthostatic hypotension 11/23/2020   Acute renal insufficiency 10/23/2020   Alcohol abuse 10/23/2020   Hypokalemia 10/23/2020   Hypomagnesemia 10/23/2020   Low back pain at multiple sites 10/23/2020   Malnutrition of mild degree Altamease Oiler: 75% to less than 90% of standard weight) ([place]) 10/23/2020   Open wound of scalp without complication 30/86/5784   Oral candidiasis 10/23/2020   Cellulitis of left foot    Cellulitis of toe of left foot 10/13/2020   Skin ulcer of left great toe ([place]) 10/13/2020   CKD (chronic kidney disease) stage 4,  GFR 15-29 ml/min ([place]) 10/13/2020   Chronic diastolic CHF (congestive heart failure) ([place]) 10/13/2020   Transient weakness of left lower extremity 09/07/2020   Heme positive stool 05/14/2020   Abnormality of gait 04/27/2020   Salmonella food poisoning 69/62/9528   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   Yeast infection 03/16/2020   Diarrhea 03/16/2020   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 ([place])    Allergies    Anemia, chronic disease    History of hypertension    Debility    Syncope and collapse 02/22/2020   AKI (acute kidney injury) ([place]) 02/10/2020   Unspecified protein-calorie malnutrition ([place]) 02/10/2020   Weakness 02/06/2020   History of repair of hip joint 10/15/2019   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   Pain in right foot 06/27/2019   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   Bradycardia 04/20/2019   Gastrointestinal hemorrhage 02/25/2019   Fall at home, initial encounter 02/25/2019   Bilateral lower extremity edema 02/25/2019   Chronic hyponatremia 02/25/2019   Fall 02/25/2019  Benign prostatic hyperplasia with lower urinary tract symptoms 12/19/2018   Iron deficiency anemia due to chronic blood loss 08/11/2018   Acute on chronic anemia 08/11/2018   Hardening of the aorta (main artery of the heart) ([place]) 08/11/2018   Benign neoplasm of colon 06/18/2018   History of adenomatous polyp of colon 08/10/2017   Impaired fasting glucose 06/10/2016   Encounter for general adult medical examination without abnormal findings 06/03/2016   Posterior dislocation of hip, closed ([place]) 04/30/2016   Hip fracture ([place]) 03/30/2016   Hypertension 03/30/2016   Hyperlipidemia 03/30/2016   Osteoarthritis 03/30/2016   Depressive disorder 03/30/2016   Rhabdomyolysis 03/30/2016   Leukocytosis 03/30/2016   Pressure ulcer 03/30/2016   Fracture of hip ([place]) 03/30/2016   GERD (gastroesophageal reflux disease) 12/17/2015   Paroxysmal atrial  fibrillation ([place]) 12/17/2015   Primary hypertension 12/17/2015   Long term (current) use of anticoagulants 12/17/2015   Moderate major depression, single episode ([place]) 12/17/2015   Pure hypercholesterolemia 12/17/2015   Seasonal allergic rhinitis 12/17/2015   CAD (coronary artery disease) 08/21/2015   PCP:  [name], MD Pharmacy:   [facility], [place] [address] Phone: [phone] Fax: [phone]     Social Determinants of Health (SDOH) Interventions    Readmission Risk Interventions    09/29/2021    2:21 PM 01/18/2021    9:28 AM 03/18/2020    4:18 PM  Readmission Risk Prevention Plan  Transportation Screening Complete Complete Complete  HRI or Home Care Consult   Complete  Social Work Consult for [place] Planning/Counseling   Complete  Palliative Care Screening   Not Applicable  Medication Review Press photographer) Complete Complete Complete  PCP or Specialist appointment within 3-5 days of discharge Complete Complete   HRI or Home Care Consult Complete Complete   SW Recovery Care/Counseling Consult Complete Complete   Palliative Care Screening Not Applicable Not Applicable   Skilled Nursing Facility Complete Complete

## 2021-11-25 NOTE — Progress Notes (Signed)
Nephrology Follow-Up Consult note   Outpatient dialysis unit: [facility] Outpatient dialysis prescription: MWF, F180, bfr 400, edw 84kg, 4hr, 2K, 2Ca, LUE AVF, mircera 48mg q2 weeks (due on 7/24)   Assessment/Recommendations: DDERELL BRUUNis a/an 81y.o. male with a past medical history notable for ESRD on HD admitted with weakness.    # ESRD: Maintain MWF schedule. No issues   # Volume/ hypertension: EDW 84kg achieve as able. Continue home BP meds   # Anemia of Chronic Kidney Disease: Hemoglobin 7.5. Dosed aranesp 1041m early on 7/19. Iron stores replete. Transfuse PRN   # Secondary Hyperparathyroidism/Hyperphosphatemia: not requiring any current treatment    # Vascular access: AVF with no issues   # Weakness: concern for UTI but no symptoms. UA hard to interpret in ESRD patient. FTT may play some role. On CTX per primary   # Additional recommendations: - Dose all meds for creatinine clearance < 10 ml/min  - Unless absolutely necessary, no MRIs with gadolinium.  - Implement save arm precautions.  Prefer needle sticks in the dorsum of the hands or wrists.  No blood pressure measurements in arm. - If blood transfusion is requested during hemodialysis sessions, please alert usKorearior to the session.  - Use synthetic opioids (Fentanyl/Dilaudid) if needed   Recommendations were discussed with the primary team.   Recommendations conveyed to primary service.    [facility] 11/25/2021 9:05 AM  ___________________________________________________________  CC: weakness  Interval History/Subjective: Patient underwent dialysis with no issues. Feels well today. Ate a good breakfast.   Medications:  Current Facility-Administered Medications  Medication Dose Route Frequency Provider Last Rate Last Admin   0.9 %  sodium chloride infusion   Intravenous PRN EzAlma FriendlyMD   Stopped at 11/24/21 2300   acetaminophen (TYLENOL) tablet 650 mg   650 mg Oral Q6H PRN Howerter, Justin B, DO       Or   acetaminophen (TYLENOL) suppository 650 mg  650 mg Rectal Q6H PRN Howerter, Justin B, DO       amiodarone (PACERONE) tablet 200 mg  200 mg Oral q morning Howerter, Justin B, DO   200 mg at 11/24/21 1657   carvedilol (COREG) tablet 6.25 mg  6.25 mg Oral BID Howerter, Justin B, DO   6.25 mg at 11/24/21 2111   cefTRIAXone (ROCEPHIN) 1 g in sodium chloride 0.9 % 100 mL IVPB  1 g Intravenous Q24H Howerter, Justin B, DO   Stopped at 11/24/21 2153   Chlorhexidine Gluconate Cloth 2 % PADS 6 each  6 each Topical Q0600 PeReesa ChewMD       Darbepoetin Alfa (ARANESP) injection 100 mcg  100 mcg Intravenous Q Wed-HD PeReesa ChewMD   100 mcg at 11/24/21 1140   escitalopram (LEXAPRO) tablet 10 mg  10 mg Oral q morning Howerter, Justin B, DO   10 mg at 11/24/21 1656   ferrous sulfate tablet 324 mg  324 mg Oral q morning Howerter, Justin B, DO   324 mg at 0796/[id number] folic acid (FOLVITE) tablet 1 mg  1 mg Oral q morning Howerter, Justin B, DO   1 mg at 11/24/21 1655   heparin injection 1,000 Units  1,000 Units Dialysis PRN PeReesa ChewMD       lidocaine (PF) (XYLOCAINE) 1 % injection 5 mL  5 mL Intradermal PRN PeReesa ChewMD       lidocaine-prilocaine (EMLA) cream 1 Application  1 Application Topical PRN Reesa Chew, MD       pantoprazole (PROTONIX) EC tablet 40 mg  40 mg Oral q morning Howerter, Justin B, DO   40 mg at 11/24/21 1656   pentafluoroprop-tetrafluoroeth (GEBAUERS) aerosol 1 Application  1 Application Topical PRN Reesa Chew, MD          Review of Systems: 10 systems reviewed and negative except per interval history/subjective  Physical Exam: Vitals:   11/25/21 0431 11/25/21 0824  BP: (!) 151/60 (!) 123/54  Pulse: 62 65  Resp: 16 20  Temp: 98.8 F (37.1 C) 97.9 F (36.6 C)  SpO2: 99% 100%   No intake/output data recorded.  Intake/Output Summary (Last 24 hours) at 11/25/2021 0905 Last data  filed at 11/25/2021 0433 Gross per 24 hour  Intake 596.33 ml  Output 225 ml  Net 371.33 ml   Constitutional: well-appearing, no acute distress ENMT: ears and nose without scars or lesions, MMM CV: normal rate, no edema Respiratory: bilateral chest rise, normal work of breathing Gastrointestinal: soft, non-tender, no palpable masses or hernias Skin: no visible lesions or rashes Psych: alert, judgement/insight appropriate, appropriate mood and affect   Test Results I personally reviewed new and old clinical labs and radiology tests Lab Results  Component Value Date   NA 135 11/25/2021   K 3.5 11/25/2021   CL 104 11/25/2021   CO2 27 11/25/2021   BUN 15 11/25/2021   CREATININE 2.49 (H) 11/25/2021   CALCIUM 8.1 (L) 11/25/2021   ALBUMIN 2.6 (L) 11/25/2021   PHOS 3.1 11/24/2021    CBC Recent Labs  Lab 11/23/21 1925 11/23/21 2024 11/23/21 2040 11/24/21 0504 11/25/21 0244  WBC 3.5*  --   --  3.9* 3.2*  NEUTROABS 1.7  --   --  2.5 2.1  HGB 7.7*   < > 7.5* 7.8* 7.5*  HCT 23.4*   < > 22.0* 22.9* 21.7*  MCV 105.9*  --   --  102.7* 101.9*  PLT 125*  --   --  129* 120*   < > = values in this interval not displayed.

## 2021-11-25 NOTE — Progress Notes (Signed)
Pt is for possible d/c tomorrow per attending. Per RN CM (see note for details), pt does not have transportation for tomorrow. Contacted [place] [facility] to inquire if clinic has availability for pt to receive a treatment on Saturday to avoid HD on day of d/c. Unfortunately, clinic does not have any availability for Saturday. Pt will require HD inpt prior to possible d/c. RN CM requesting first shift HD. Contacted inpt HD unit to request pt receive HD first shift per staff request. Will assist as needed.   Melven Sartorius Renal Navigator [phone]

## 2021-11-26 DIAGNOSIS — Z66 Do not resuscitate: Secondary | ICD-10-CM | POA: Diagnosis present

## 2021-11-26 DIAGNOSIS — K219 Gastro-esophageal reflux disease without esophagitis: Secondary | ICD-10-CM | POA: Diagnosis present

## 2021-11-26 DIAGNOSIS — I5032 Chronic diastolic (congestive) heart failure: Secondary | ICD-10-CM | POA: Diagnosis not present

## 2021-11-26 DIAGNOSIS — K7030 Alcoholic cirrhosis of liver without ascites: Secondary | ICD-10-CM | POA: Diagnosis present

## 2021-11-26 DIAGNOSIS — D649 Anemia, unspecified: Secondary | ICD-10-CM | POA: Diagnosis not present

## 2021-11-26 DIAGNOSIS — Z8674 Personal history of sudden cardiac arrest: Secondary | ICD-10-CM | POA: Diagnosis not present

## 2021-11-26 DIAGNOSIS — D631 Anemia in chronic kidney disease: Secondary | ICD-10-CM | POA: Diagnosis not present

## 2021-11-26 DIAGNOSIS — K760 Fatty (change of) liver, not elsewhere classified: Secondary | ICD-10-CM | POA: Diagnosis present

## 2021-11-26 DIAGNOSIS — D61818 Other pancytopenia: Secondary | ICD-10-CM | POA: Diagnosis not present

## 2021-11-26 DIAGNOSIS — N186 End stage renal disease: Secondary | ICD-10-CM | POA: Diagnosis not present

## 2021-11-26 DIAGNOSIS — I120 Hypertensive chronic kidney disease with stage 5 chronic kidney disease or end stage renal disease: Secondary | ICD-10-CM | POA: Diagnosis not present

## 2021-11-26 DIAGNOSIS — N3000 Acute cystitis without hematuria: Secondary | ICD-10-CM | POA: Diagnosis not present

## 2021-11-26 DIAGNOSIS — I2510 Atherosclerotic heart disease of native coronary artery without angina pectoris: Secondary | ICD-10-CM | POA: Diagnosis present

## 2021-11-26 DIAGNOSIS — R6520 Severe sepsis without septic shock: Secondary | ICD-10-CM | POA: Diagnosis present

## 2021-11-26 DIAGNOSIS — N390 Urinary tract infection, site not specified: Secondary | ICD-10-CM | POA: Diagnosis present

## 2021-11-26 DIAGNOSIS — Z992 Dependence on renal dialysis: Secondary | ICD-10-CM | POA: Diagnosis not present

## 2021-11-26 DIAGNOSIS — I4819 Other persistent atrial fibrillation: Secondary | ICD-10-CM | POA: Diagnosis present

## 2021-11-26 DIAGNOSIS — G2581 Restless legs syndrome: Secondary | ICD-10-CM | POA: Diagnosis present

## 2021-11-26 DIAGNOSIS — R531 Weakness: Secondary | ICD-10-CM | POA: Diagnosis not present

## 2021-11-26 DIAGNOSIS — F1021 Alcohol dependence, in remission: Secondary | ICD-10-CM | POA: Diagnosis present

## 2021-11-26 DIAGNOSIS — E871 Hypo-osmolality and hyponatremia: Secondary | ICD-10-CM | POA: Diagnosis not present

## 2021-11-26 DIAGNOSIS — I132 Hypertensive heart and chronic kidney disease with heart failure and with stage 5 chronic kidney disease, or end stage renal disease: Secondary | ICD-10-CM | POA: Diagnosis present

## 2021-11-26 DIAGNOSIS — E872 Acidosis, unspecified: Secondary | ICD-10-CM | POA: Diagnosis present

## 2021-11-26 DIAGNOSIS — E785 Hyperlipidemia, unspecified: Secondary | ICD-10-CM | POA: Diagnosis present

## 2021-11-26 DIAGNOSIS — A419 Sepsis, unspecified organism: Secondary | ICD-10-CM | POA: Diagnosis not present

## 2021-11-26 DIAGNOSIS — N2581 Secondary hyperparathyroidism of renal origin: Secondary | ICD-10-CM | POA: Diagnosis not present

## 2021-11-26 DIAGNOSIS — F32A Depression, unspecified: Secondary | ICD-10-CM | POA: Diagnosis present

## 2021-11-26 LAB — CBC WITH DIFFERENTIAL/PLATELET
Abs Immature Granulocytes: 0.01 10*3/uL (ref 0.00–0.07)
Basophils Absolute: 0 10*3/uL (ref 0.0–0.1)
Basophils Relative: 1 %
Eosinophils Absolute: 0 10*3/uL (ref 0.0–0.5)
Eosinophils Relative: 0 %
HCT: 21.7 % — ABNORMAL LOW (ref 39.0–52.0)
Hemoglobin: 7.4 g/dL — ABNORMAL LOW (ref 13.0–17.0)
Immature Granulocytes: 0 %
Lymphocytes Relative: 30 %
Lymphs Abs: 0.9 10*3/uL (ref 0.7–4.0)
MCH: 34.7 pg — ABNORMAL HIGH (ref 26.0–34.0)
MCHC: 34.1 g/dL (ref 30.0–36.0)
MCV: 101.9 fL — ABNORMAL HIGH (ref 80.0–100.0)
Monocytes Absolute: 0.4 10*3/uL (ref 0.1–1.0)
Monocytes Relative: 13 %
Neutro Abs: 1.7 10*3/uL (ref 1.7–7.7)
Neutrophils Relative %: 56 %
Platelets: 111 10*3/uL — ABNORMAL LOW (ref 150–400)
RBC: 2.13 MIL/uL — ABNORMAL LOW (ref 4.22–5.81)
RDW: 17.2 % — ABNORMAL HIGH (ref 11.5–15.5)
WBC: 3 10*3/uL — ABNORMAL LOW (ref 4.0–10.5)
nRBC: 0 % (ref 0.0–0.2)

## 2021-11-26 LAB — URINE CULTURE: Culture: >=100000 — AB

## 2021-11-26 LAB — COMPREHENSIVE METABOLIC PANEL
ALT: 18 U/L (ref 0–44)
AST: 27 U/L (ref 15–41)
Albumin: 2.7 g/dL — ABNORMAL LOW (ref 3.5–5.0)
Alkaline Phosphatase: 111 U/L (ref 38–126)
Anion gap: 11 (ref 5–15)
BUN: 21 mg/dL (ref 8–23)
CO2: 25 mmol/L (ref 22–32)
Calcium: 8.5 mg/dL — ABNORMAL LOW (ref 8.9–10.3)
Chloride: 101 mmol/L (ref 98–111)
Creatinine, Ser: 2.84 mg/dL — ABNORMAL HIGH (ref 0.61–1.24)
GFR, Estimated: 22 mL/min — ABNORMAL LOW (ref >60)
Glucose, Bld: 93 mg/dL (ref 70–99)
Potassium: 3.5 mmol/L (ref 3.5–5.1)
Sodium: 137 mmol/L (ref 135–145)
Total Bilirubin: 0.8 mg/dL (ref 0.3–1.2)
Total Protein: 4.7 g/dL — ABNORMAL LOW (ref 6.5–8.1)

## 2021-11-26 MED ORDER — TRAZODONE HCL 50 MG PO TABS: 50.0000 mg | ORAL_TABLET | Freq: Every day | ORAL | 0 refills | Status: AC

## 2021-11-26 MED ORDER — FOSFOMYCIN TROMETHAMINE 3 G PO PACK: 3.0000 g | PACK | Freq: Once | ORAL | 0 refills | Status: DC

## 2021-11-26 MED ORDER — FOSFOMYCIN TROMETHAMINE 3 G PO PACK
3.0000 g | PACK | Freq: Once | ORAL | Status: AC
  Administered 2021-11-26: 3 g via ORAL
  Filled 2021-11-26: qty 3

## 2021-11-26 NOTE — TOC Transition Note (Signed)
Transition of Care [facility]) - CM/SW Discharge Note   Patient Details  Name: Darryl Diaz MRN: [id number] Date of Birth: 1940-12-27  Transition [facility]) CM/SW Contact:  Zenon Mayo, RN Phone Number: 11/26/2021, 11:25 AM   Clinical Narrative:    For dc today, will need ast with transport.  NCM notified Tommi Rumps with [facility] regarding dc today.     Final next level of care: [place] Barriers to Discharge: Continued Medical Work up   Patient Goals and CMS Choice Patient states their goals for this hospitalization and ongoing recovery are:: return home with [facility] CMS Medicare.gov Compare Post Acute Care list provided to:: Patient Choice offered to / list presented to : Patient  Discharge Placement                       Discharge Plan and Services   Discharge Planning Services: CM Consult Post Acute Care Choice: Home Health            DME Agency: NA       HH Arranged: PT, OT, RN, Disease Management [place] Agency: [place] Date [facility] Agency Contacted: 11/25/21 Time [place]: [phone] Representative spoke with at [place]: [place] ([place]) Interventions     Readmission Risk Interventions    09/29/2021    2:21 PM 01/18/2021    9:28 AM 03/18/2020    4:18 PM  Readmission Risk Prevention Plan  Transportation Screening Complete Complete Complete  HRI or Home Care Consult   Complete  Social Work Consult for [place] Planning/Counseling   Complete  Palliative Care Screening   Not Applicable  Medication Review Press photographer) Complete Complete Complete  PCP or Specialist appointment within 3-5 days of discharge Complete Complete   HRI or Home Care Consult Complete Complete   SW Recovery Care/Counseling Consult Complete Complete   Palliative Care Screening Not Applicable Not Applicable   Skilled Nursing Facility Complete Complete

## 2021-11-26 NOTE — Procedures (Signed)
I was present at this dialysis session. I have reviewed the session itself and made appropriate changes.   Filed Weights   11/25/21 0433 11/26/21 0350 11/26/21 0709  Weight: 87.9 kg 86.9 kg 85.9 kg    Recent Labs  Lab 11/24/21 0504 11/25/21 0340 11/26/21 0722  NA 134*   < > 137  K 4.1   < > 3.5  CL 98   < > 101  CO2 25   < > 25  GLUCOSE 76   < > 93  BUN 20   < > 21  CREATININE 2.69*   < > 2.84*  CALCIUM 8.4*   < > 8.5*  PHOS 3.1  --   --    < > = values in this interval not displayed.    Recent Labs  Lab 11/24/21 0504 11/25/21 0244 11/26/21 0722  WBC 3.9* 3.2* 3.0*  NEUTROABS 2.5 2.1 1.7  HGB 7.8* 7.5* 7.4*  HCT 22.9* 21.7* 21.7*  MCV 102.7* 101.9* 101.9*  PLT 129* 120* 111*    Scheduled Meds:  amiodarone  200 mg Oral q morning   carvedilol  6.25 mg Oral BID   Chlorhexidine Gluconate Cloth  6 each Topical Q0600   darbepoetin (ARANESP) injection - DIALYSIS  100 mcg Intravenous Q Wed-HD   escitalopram  10 mg Oral q morning   ferrous sulfate  324 mg Oral q morning   folic acid  1 mg Oral q morning   pantoprazole  40 mg Oral q morning   Continuous Infusions:  sodium chloride Stopped (11/24/21 2300)   cefTRIAXone (ROCEPHIN)  IV 1 g (11/25/21 2327)   PRN Meds:.sodium chloride, acetaminophen **OR** acetaminophen, heparin, lidocaine (PF), lidocaine-prilocaine, pentafluoroprop-tetrafluoroeth   Santiago Bumpers,  MD 11/26/2021, 10:38 AM

## 2021-11-26 NOTE — Progress Notes (Signed)
Pt to d/c to home today. Contacted [place] [facility] to advise clinic of pt's d/c today and that pt will resume care on Monday.   Melven Sartorius Renal Navigator [phone]

## 2021-11-26 NOTE — Discharge Summary (Signed)
Physician Discharge Summary   Patient: Darryl Diaz MRN: [id number] DOB: 02/13/1941  Admit date:     11/23/2021  Discharge date: 11/26/21  Discharge Physician: Alma Friendly   PCP: [name], MD   Recommendations at discharge:   Follow-up with PCP in 1 week  Discharge Diagnoses: Principal Problem:   Generalized weakness Active Problems:   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HCC)   Paroxysmal atrial fibrillation (HCC)   GERD (gastroesophageal reflux disease)   Acute on chronic anemia   Chronic hyponatremia   History of hypertension   Chronic diastolic CHF (congestive heart failure) (HCC)   Pancytopenia (HCC)   UTI (urinary tract infection)   Severe sepsis (HCC)   Lactic acidosis    Hospital Course: Darryl Diaz is a 81 y.o. male with medical history significant for end-stage renal disease on hemodialysis on Monday, Wednesday, Friday, paroxysmal atrial fibrillation not chronically anticoagulated in the setting of recurrent falls, essential hypertension, pancytopenia, chronic diastolic heart failure, chronic hyponatremia with baseline serum sodium level 130-134, who is admitted to [facility] on 11/23/2021 with generalized weakness.  Labs notable for pancytopenia, with possible UTI.  Patient admitted for severe sepsis likely due to UTI, acute on chronic anemia with chronic pancytopenia  Assessment and Plan:  Generalized weakness Unknown etiology, multifactorial with likely 2/2 severe sepsis 2/2 UTI CK- 35 Further management for UTI PT/OT-Home health   ?Severe sepsis likely due to urinary tract infection Tachypnea, leukopenia, LA 3 on admission, trended back to WNL UA with pyuria, moderate leukocyte esterase, no evidence of squamous epithelial cells UC showed >100, 000 Enterococcus faecalis BC x2 NGTD Chest x-ray unremarkable S/P IV Rocephin which does not cover E. Faecalis, received a dose of fosfomycin on 11/26/2021 as the only option available to  patient risk currently stable without any current sepsis phenomenon Follow-up with PCP   Acute on chronic anemia of chronic disease Chronic pancytopenia Noted positive FOBT Baseline hemoglobin 9-11, presenting hemoglobin found to be slightly lower at 7.7 Normal folic acid SAYTK/Z60 level, normal INR Peripheral smear via technologist smear review showed normal morphology Repeat anemia panel showed iron 128, sats 58, ferritin 982 PCP please refer patient for GI consultation as an outpatient as well as outpatient heme-onc consultation for further work up for chronic pancytopenia Follow-up with PCP   ESRD-Oliguric On HD M/W/F Neurology consulted Further management per nephrology   Chronic diastolic heart failure Appears euvolemic Echocardiogram performed in September 2022 notable for LV EF 65% as well as indeterminate diastolic parameters   Paroxysmal atrial fibrillation CHA2DS2-VASc score of 6, not on AC due to recurrent falls  Continue amiodarone    Essential Hypertension BP stable Continue home Coreg     Depression Continue Lexapro    GERD Continue PPI     Pain control - [place] Controlled Substance Reporting System database was reviewed. and patient was instructed, not to drive, operate heavy machinery, perform activities at heights, swimming or participation in water activities or provide baby-sitting services while on Pain, Sleep and Anxiety Medications; until their outpatient Physician has advised to do so again. Also recommended to not to take more than prescribed Pain, Sleep and Anxiety Medications.   Consultants: Nephrology Procedures performed: None Disposition: Home health Diet recommendation: Renal   Discharge Diet Orders (From admission, onward)     Start     Ordered   11/26/21 0000  Diet - low sodium heart healthy        11/26/21 1058  Renal diet DISCHARGE MEDICATION: Allergies as of 11/26/2021       Reactions   Penicillin G  Sodium Rash, Other (See Comments)    (tolerates Keflex)   Penicillins Rash        Medication List     STOP taking these medications    albuterol (2.5 MG/3ML) 0.083% nebulizer solution Commonly known as: PROVENTIL       TAKE these medications    acetaminophen 325 MG tablet Commonly known as: TYLENOL Take 2 tablets (650 mg total) by mouth every 4 (four) hours as needed for headache or mild pain. What changed:  when to take this reasons to take this   amiodarone 200 MG tablet Commonly known as: PACERONE TAKE 1 TABLET BY MOUTH ONCE DAILY What changed: when to take this   atorvastatin 20 MG tablet Commonly known as: LIPITOR Take 1 tablet (20 mg total) by mouth daily.   carvedilol 6.25 MG tablet Commonly known as: COREG Take 1 tablet (6.25 mg total) by mouth 2 (two) times daily. What changed:  how much to take when to take this   cetirizine 10 MG tablet Commonly known as: ZYRTEC Take 10 mg by mouth daily.   cyanocobalamin 500 MCG tablet Commonly known as: CYANOCOBALAMIN Take 1 tablet (500 mcg total) by mouth daily.   escitalopram 10 MG tablet Commonly known as: LEXAPRO Take 1 tablet (10 mg total) by mouth daily. What changed: when to take this   ferrous sulfate 324 MG Tbec Take 324 mg by mouth every morning.   FOLIC ACID PO Take 1 tablet by mouth every morning.   Magnesium Oxide 400 MG Caps Take 1 capsule (400 mg total) by mouth daily.   MULTIVITAMIN PO Take 1 tablet by mouth daily.   nitroGLYCERIN 0.4 MG SL tablet Commonly known as: NITROSTAT Place 1 tablet (0.4 mg total) under the tongue every 5 (five) minutes x 3 doses as needed for chest pain.   pantoprazole 40 MG tablet Commonly known as: PROTONIX Take 1 tablet (40 mg total) by mouth daily. What changed: when to take this   sodium bicarbonate 650 MG tablet Take 650 mg by mouth daily.   tamsulosin 0.4 MG Caps capsule Commonly known as: FLOMAX Take 0.4 mg by mouth at bedtime.    torsemide 20 MG tablet Commonly known as: DEMADEX Take 60 mg by mouth daily.   traZODone 50 MG tablet Commonly known as: DESYREL Take 1 tablet (50 mg total) by mouth at bedtime.        Follow-up Information     [facility] Follow up.   Specialty: Home Health Services Why: HHRN, HHPT, [facility] will call you to set up appt times. Contact information: [place] [address] [phone]         Tisovec, [name], MD Follow up.   Specialty: Internal Medicine Why: The office will call patient Contact information: [place] [address] [phone]                Discharge Exam: Danley Danker Weights   11/26/21 0709 11/26/21 1153 11/26/21 1157  Weight: 85.9 kg 84.7 kg 84.7 kg   General: NAD  Cardiovascular: S1, S2 present Respiratory: CTAB Abdomen: Soft, nontender, nondistended, bowel sounds present Musculoskeletal: No bilateral pedal edema noted Skin: Normal Psychiatry: Normal mood   Condition at discharge: stable  The results of significant diagnostics from this hospitalization (including imaging, microbiology, ancillary and laboratory) are listed below for reference.  Imaging Studies: DG Chest Port 1 View  Result Date: 11/23/2021 CLINICAL DATA:  Weakness, lightheadedness, near syncope EXAM: PORTABLE CHEST 1 VIEW COMPARISON:  09/27/2021 FINDINGS: Single frontal view of the chest demonstrates an unremarkable cardiac silhouette. No acute airspace disease, effusion, or pneumothorax. No acute bony abnormality. IMPRESSION: 1. No acute intrathoracic process. Electronically Signed   By: Randa Ngo M.D.   On: 11/23/2021 19:55   CT Head Wo Contrast  Result Date: 11/20/2021 CLINICAL DATA:  81 year old male history of trauma from a fall. Loss of consciousness. EXAM: CT HEAD WITHOUT CONTRAST CT CERVICAL SPINE WITHOUT CONTRAST TECHNIQUE: Multidetector CT imaging of the head and cervical spine was performed  following the standard protocol without intravenous contrast. Multiplanar CT image reconstructions of the cervical spine were also generated. RADIATION DOSE REDUCTION: This exam was performed according to the departmental dose-optimization program which includes automated exposure control, adjustment of the mA and/or kV according to patient size and/or use of iterative reconstruction technique. COMPARISON:  Head and cervical spine CT 11/19/2021. FINDINGS: CT HEAD FINDINGS Brain: Severe cerebral and mild cerebellar atrophy. Ex vacuo dilatation of the ventricular system. Patchy and confluent areas of decreased attenuation are noted throughout the deep and periventricular white matter of the cerebral hemispheres bilaterally, compatible with chronic microvascular ischemic disease.No evidence of acute infarction, hemorrhage, hydrocephalus, extra-axial collection or mass lesion/mass effect. Vascular: No hyperdense vessel or unexpected calcification. Skull: Normal. Negative for fracture or focal lesion. Sinuses/Orbits: No acute finding. Other: None. CT CERVICAL SPINE FINDINGS Alignment: Mild reversal of normal cervical lordosis centered at the level of C4-C5, presumably positional and/or chronic and degenerative in nature. Alignment is otherwise anatomic. Skull base and vertebrae: No acute fracture. No primary bone lesion or focal pathologic process. Soft tissues and spinal canal: No prevertebral fluid or swelling. No visible canal hematoma. Disc levels: Severe multilevel degenerative disc disease, most pronounced at C5-C6 and C6-C7. Severe multilevel facet arthropathy bilaterally. Upper chest: Unremarkable. Other: None. IMPRESSION: 1. No evidence of significant acute traumatic injury to the skull, brain or cervical spine. 2. Severe cerebral and mild cerebellar atrophy with extensive chronic microvascular ischemic changes in the cerebral white matter, as above. 3. 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 Electronically Signed   By: Vinnie Langton M.D.   On: 11/20/2021 08:26   CT Cervical Spine Wo Contrast  Result Date: 11/20/2021 CLINICAL DATA:  81 year old male history of trauma from a fall. Loss of consciousness. EXAM: CT HEAD WITHOUT CONTRAST CT CERVICAL SPINE WITHOUT CONTRAST TECHNIQUE: Multidetector CT imaging of the head and cervical spine was performed following the standard protocol without intravenous contrast. Multiplanar CT image reconstructions of the cervical spine were also generated. RADIATION DOSE REDUCTION: This exam was performed according to the departmental dose-optimization program which includes automated exposure control, adjustment of the mA and/or kV according to patient size and/or use of iterative reconstruction technique. COMPARISON:  Head and cervical spine CT 11/19/2021. FINDINGS: CT HEAD FINDINGS Brain: Severe cerebral and mild cerebellar atrophy. Ex vacuo dilatation of the ventricular system. Patchy and confluent areas of decreased attenuation are noted throughout the deep and periventricular white matter of the cerebral hemispheres bilaterally, compatible with chronic microvascular ischemic disease.No evidence of acute infarction, hemorrhage, hydrocephalus, extra-axial collection or mass lesion/mass effect. Vascular: No hyperdense vessel or unexpected calcification. Skull: Normal. Negative for fracture or focal lesion. Sinuses/Orbits: No acute finding. Other: None. CT CERVICAL SPINE FINDINGS Alignment: Mild reversal of normal cervical lordosis centered at the level of C4-C5, presumably  positional and/or chronic and degenerative in nature. Alignment is otherwise anatomic. Skull base and vertebrae: No acute fracture. No primary bone lesion or focal pathologic process. Soft tissues and spinal canal: No prevertebral fluid or swelling. No visible canal hematoma. Disc levels: Severe multilevel degenerative disc disease, most pronounced at C5-C6 and C6-C7.  Severe multilevel facet arthropathy bilaterally. Upper chest: Unremarkable. Other: None. IMPRESSION: 1. No evidence of significant acute traumatic injury to the skull, brain or cervical spine. 2. Severe cerebral and mild cerebellar atrophy with extensive chronic microvascular ischemic changes in the cerebral white matter, as above. 3. 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 Electronically Signed   By: Vinnie Langton M.D.   On: 11/20/2021 08:26   CT ABDOMEN PELVIS W CONTRAST  Result Date: 11/19/2021 CLINICAL DATA:  Blunt abdominal trauma post fall, fell while going to dialysis EXAM: CT ABDOMEN AND PELVIS WITH CONTRAST TECHNIQUE: Multidetector CT imaging of the abdomen and pelvis was performed using the standard protocol following bolus administration of intravenous contrast. RADIATION DOSE REDUCTION: This exam was performed according to the departmental dose-optimization program which includes automated exposure control, adjustment of the mA and/or kV according to patient size and/or use of iterative reconstruction technique. CONTRAST:  1m OMNIPAQUE IOHEXOL 300 MG/ML SOLN IV. No oral contrast. COMPARISON:  09/10/2020 FINDINGS: Lower chest: Bibasilar emphysematous changes and mild chronic interstitial prominence. No infiltrate or pneumothorax. Trace pleural fluid. Hepatobiliary: Nodular cirrhotic appearing liver. Few nonspecific low-attenuation foci within the liver, favor tiny cysts up to 5 mm diameter. Calcified gallstones dependently in gallbladder. No biliary dilatation. Few calcified granulomata RIGHT lobe liver superiorly. Pancreas: Normal appearance Spleen: Normal appearance Adrenals/Urinary Tract: 1.5 cm diameter cyst at inferior pole LEFT kidney. Additional tiny exophytic lesion posterior LEFT kidney 6 mm diameter, indeterminate, unchanged. Adrenal glands, kidneys, ureters, and visualized bladder otherwise normal appearance. Stomach/Bowel: Normal appendix.  Questionable wall thickening of gastric antrum versus artifact from underdistention. Distal descending and sigmoid diverticulosis without evidence of diverticulitis. Vascular/Lymphatic: Atherosclerotic calcifications of aorta and iliac arteries without aneurysm. Vascular structures patent. Perigastric collaterals. Reproductive: Prostate gland and seminal vesicles obscured by bowel gas Other: No free air or free fluid. Tiny umbilical hernia containing fat. No inflammatory process. Musculoskeletal: Multilevel degenerative disc and facet disease changes lumbar spine with scattered areas of spinal stenosis and neural foraminal stenosis. IMPRESSION: Cirrhotic appearing liver with perigastric collaterals. Cholelithiasis. Distal colonic diverticulosis without evidence of diverticulitis. Tiny umbilical hernia containing fat. No acute intra-abdominal or intrapelvic abnormalities. Aortic Atherosclerosis (ICD10-I70.0). Electronically Signed   By: MLavonia DanaM.D.   On: 11/19/2021 15:32   CT Lumbar Spine Wo Contrast  Result Date: 11/19/2021 CLINICAL DATA:  Fall, back pain EXAM: CT LUMBAR SPINE WITHOUT CONTRAST TECHNIQUE: Multidetector CT imaging of the lumbar spine was performed without intravenous contrast administration. Multiplanar CT image reconstructions were also generated. RADIATION DOSE REDUCTION: This exam was performed according to the departmental dose-optimization program which includes automated exposure control, adjustment of the mA and/or kV according to patient size and/or use of iterative reconstruction technique. COMPARISON:  CT 03/16/2020 FINDINGS: Segmentation: 5 lumbar type vertebrae. Alignment: Trace degenerative retrolisthesis of L1 on L2. Facet joint alignment is maintained. No traumatic listhesis. Vertebrae: No acute fracture. No suspicious lytic or sclerotic bone lesion. Paraspinal and other soft tissues: Aortic atherosclerosis. Cholelithiasis. Disc levels: Chronic advanced degenerative disc  disease and facet arthropathy throughout the lumbar spine. Overall, findings appear slightly progressed compared to the previous CT from 2021. IMPRESSION: 1. No acute fracture or  traumatic listhesis of the lumbar spine. 2. Chronic advanced degenerative disc disease and facet arthropathy throughout the lumbar spine, slightly progressed compared to the previous CT from 2021. 3. Cholelithiasis. 4. Aortic atherosclerosis (ICD10-I70.0). Electronically Signed   By: Davina Poke D.O.   On: 11/19/2021 15:02   CT Cervical Spine Wo Contrast  Result Date: 11/19/2021 CLINICAL DATA:  Patient is status post fall. EXAM: CT CERVICAL SPINE WITHOUT CONTRAST TECHNIQUE: Multidetector CT imaging of the cervical spine was performed without intravenous contrast. Multiplanar CT image reconstructions were also generated. RADIATION DOSE REDUCTION: This exam was performed according to the departmental dose-optimization program which includes automated exposure control, adjustment of the mA and/or kV according to patient size and/or use of iterative reconstruction technique. COMPARISON:  None Available. FINDINGS: Alignment: Normal. Skull base and vertebrae: No acute fracture. Multilevel degenerative disc disease most pronounced C5-6 and C6-7. Multilevel facet degenerative changes most pronounced left C2-3 and C3-4. Soft tissues and spinal canal: No prevertebral fluid or swelling. No visible canal hematoma. Disc levels:  No fracture Upper chest: Unremarkable. Other: None. IMPRESSION: Multilevel degenerative disc and facet disease.  No acute fracture. Electronically Signed   By: Lovey Newcomer M.D.   On: 11/19/2021 14:55   CT HEAD WO CONTRAST (5MM)  Result Date: 11/19/2021 CLINICAL DATA:  Head trauma, moderate-severe EXAM: CT HEAD WITHOUT CONTRAST TECHNIQUE: Contiguous axial images were obtained from the base of the skull through the vertex without intravenous contrast. RADIATION DOSE REDUCTION: This exam was performed according to the  departmental dose-optimization program which includes automated exposure control, adjustment of the mA and/or kV according to patient size and/or use of iterative reconstruction technique. COMPARISON:  09/27/2021 FINDINGS: Brain: There is no acute intracranial hemorrhage, mass effect, or edema. Gray-white differentiation is preserved. There is no extra-axial fluid collection. Prominence of the ventricles and sulci reflects stable parenchymal volume loss. Patchy hypoattenuation in the supratentorial white matter probably reflects stable chronic microvascular ischemic changes. Vascular: There is atherosclerotic calcification at the skull base. Skull: Calvarium is unremarkable. Sinuses/Orbits: No acute finding. Other: None. IMPRESSION: No evidence of acute intracranial injury. Electronically Signed   By: Macy Mis M.D.   On: 11/19/2021 13:12   XR Toe 4th Left  Result Date: 11/05/2021 Radiographs of the left 4th toe show ulceration with silver nitrate probing near the tip of distal phalanx. No destructive changes seen.    Microbiology: Results for orders placed or performed during the hospital encounter of 11/23/21  Blood Culture (routine x 2)     Status: None (Preliminary result)   Collection Time: 11/23/21  7:27 PM   Specimen: BLOOD  Result Value Ref Range Status   Specimen Description BLOOD RIGHT ANTECUBITAL  Final   Special Requests   Final    BOTTLES DRAWN AEROBIC AND ANAEROBIC Blood Culture adequate volume   Culture   Final    NO GROWTH 3 DAYS Performed at [facility], [address] [place], [place] [address]    Report Status PENDING  Incomplete  Blood Culture (routine x 2)     Status: None (Preliminary result)   Collection Time: 11/23/21  8:00 PM   Specimen: BLOOD RIGHT WRIST  Result Value Ref Range Status   Specimen Description BLOOD RIGHT WRIST  Final   Special Requests   Final    BOTTLES DRAWN AEROBIC AND ANAEROBIC Blood Culture adequate volume   Culture   Final    NO  GROWTH 3 DAYS Performed at [facility], [place]  [address] [place], [place] [address]    Report Status PENDING  Incomplete  Urine Culture     Status: Abnormal   Collection Time: 11/23/21 10:44 PM   Specimen: Urine, Clean Catch  Result Value Ref Range Status   Specimen Description URINE, CLEAN CATCH  Final   Special Requests   Final    NONE Performed at [facility], [place] [address], [place], [place] [address]    Culture >=100,000 COLONIES/mL ENTEROCOCCUS FAECALIS (A)  Final   Report Status 11/26/2021 FINAL  Final   Organism ID, Bacteria ENTEROCOCCUS FAECALIS (A)  Final      Susceptibility   Enterococcus faecalis - MIC*    AMPICILLIN <=2 SENSITIVE Sensitive     NITROFURANTOIN <=16 SENSITIVE Sensitive     VANCOMYCIN 1 SENSITIVE Sensitive     * >=100,000 COLONIES/mL ENTEROCOCCUS FAECALIS    Labs: CBC: Recent Labs  Lab 11/20/21 0726 11/23/21 1925 11/23/21 2024 11/23/21 2040 11/24/21 0504 11/25/21 0244 11/26/21 0722  WBC 3.5* 3.5*  --   --  3.9* 3.2* 3.0*  NEUTROABS  --  1.7  --   --  2.5 2.1 1.7  HGB 8.3* 7.7* 7.1* 7.5* 7.8* 7.5* 7.4*  HCT 25.2* 23.4* 21.0* 22.0* 22.9* 21.7* 21.7*  MCV 102.9* 105.9*  --   --  102.7* 101.9* 101.9*  PLT 121* 125*  --   --  129* 120* 836*   Basic Metabolic Panel: Recent Labs  Lab 11/20/21 0726 11/23/21 1925 11/23/21 2024 11/23/21 2040 11/24/21 0504 11/25/21 0340 11/26/21 0722  NA 134* 131* 130* 130* 134* 135 137  K 3.6 4.3 4.2 4.2 4.1 3.5 3.5  CL 96* 94* 93* 93* 98 104 101  CO2 26 23  --   --  '25 27 25  '$ GLUCOSE 115* 72 74 75 76 94 93  BUN '14 18 16 17 20 15 21  '$ CREATININE 2.01* 2.83* 2.90* 2.90* 2.69* 2.49* 2.84*  CALCIUM 8.0* 8.5*  --   --  8.4* 8.1* 8.5*  MG  --  1.9  --   --  1.8  --   --   PHOS  --   --   --   --  3.1  --   --    Liver Function Tests: Recent Labs  Lab 11/20/21 0726 11/23/21 1925 11/24/21 0504 11/25/21 0340 11/26/21 0722  AST 32 33 '28 28 27  '$ ALT '19 20 18 16 18  '$ ALKPHOS 114 126 114 117 111   BILITOT 1.6* 1.2 1.8* 1.2 0.8  PROT 5.1* 5.6* 5.0* 4.6* 4.7*  ALBUMIN 3.1* 3.3* 2.8* 2.6* 2.7*   CBG: No results for input(s): "GLUCAP" in the last 168 hours.  Discharge time spent: greater than 30 minutes.  Signed: Alma Friendly, MD [facility] 11/26/2021

## 2021-11-26 NOTE — Progress Notes (Signed)
Nephrology Follow-Up Consult note   Outpatient dialysis unit: [facility] Outpatient dialysis prescription: MWF, F180, bfr 400, edw 84kg, 4hr, 2K, 2Ca, LUE AVF, mircera 54mg q2 weeks (due on 7/24)   Assessment/Recommendations: Darryl SOOKDEOis a/an 81y.o. male with a past medical history notable for ESRD on HD admitted with weakness.    # ESRD: Maintain MWF schedule. No issues with dialysis today   # Volume/ hypertension: EDW 84kg achieve as able. Continue home BP meds   # Anemia of Chronic Kidney Disease: Hemoglobin 7.4. Dosed aranesp 1092m early on 7/19. Iron stores replete. Transfuse PRN   # Secondary Hyperparathyroidism/Hyperphosphatemia: not requiring any current treatment    # Vascular access: AVF with no issues   # Weakness: concern for UTI but no symptoms. UA hard to interpret in ESRD patient. FTT may play some role.  Seems to be much better.  Stable for discharge from nephrology perspective.   # Additional recommendations: - Dose all meds for creatinine clearance < 10 ml/min  - Unless absolutely necessary, no MRIs with gadolinium.  - Implement save arm precautions.  Prefer needle sticks in the dorsum of the hands or wrists.  No blood pressure measurements in arm. - If blood transfusion is requested during hemodialysis sessions, please alert usKorearior to the session.  - Use synthetic opioids (Fentanyl/Dilaudid) if needed   Recommendations were discussed with the primary team.   Recommendations conveyed to primary service.    [facility] 11/26/2021 10:37 AM  ___________________________________________________________  CC: weakness  Interval History/Subjective: Patient states he feels well today with no complaints.  Feels back to himself.   Medications:  Current Facility-Administered Medications  Medication Dose Route Frequency Provider Last Rate Last Admin   0.9 %  sodium chloride infusion   Intravenous PRN EzAlma FriendlyMD   Stopped at 11/24/21 2300   acetaminophen (TYLENOL) tablet 650 mg  650 mg Oral Q6H PRN Howerter, Justin B, DO       Or   acetaminophen (TYLENOL) suppository 650 mg  650 mg Rectal Q6H PRN Howerter, Justin B, DO       amiodarone (PACERONE) tablet 200 mg  200 mg Oral q morning Howerter, Justin B, DO   200 mg at 11/25/21 0911   carvedilol (COREG) tablet 6.25 mg  6.25 mg Oral BID Howerter, Justin B, DO   6.25 mg at 11/25/21 2325   cefTRIAXone (ROCEPHIN) 1 g in sodium chloride 0.9 % 100 mL IVPB  1 g Intravenous Q24H Howerter, Justin B, DO 200 mL/hr at 11/25/21 2327 1 g at 11/25/21 2327   Chlorhexidine Gluconate Cloth 2 % PADS 6 each  6 each Topical Q0600 PeReesa ChewMD   6 each at 11/26/21 0517   Darbepoetin Alfa (ARANESP) injection 100 mcg  100 mcg Intravenous Q Wed-HD PeReesa ChewMD   100 mcg at 11/24/21 1140   escitalopram (LEXAPRO) tablet 10 mg  10 mg Oral q morning Howerter, Justin B, DO   10 mg at 11/25/21 0911   ferrous sulfate tablet 324 mg  324 mg Oral q morning Howerter, Justin B, DO   324 mg at 0721/[id number] folic acid (FOLVITE) tablet 1 mg  1 mg Oral q morning Howerter, Justin B, DO   1 mg at 11/25/21 0911   heparin injection 1,000 Units  1,000 Units Dialysis PRN PeReesa ChewMD       lidocaine (PF) (XYLOCAINE) 1 % injection 5 mL  5 mL Intradermal PRN Reesa Chew, MD       lidocaine-prilocaine (EMLA) cream 1 Application  1 Application Topical PRN Reesa Chew, MD       pantoprazole (PROTONIX) EC tablet 40 mg  40 mg Oral q morning Howerter, Justin B, DO   40 mg at 11/25/21 0911   pentafluoroprop-tetrafluoroeth (GEBAUERS) aerosol 1 Application  1 Application Topical PRN Reesa Chew, MD          Review of Systems: 10 systems reviewed and negative except per interval history/subjective  Physical Exam: Vitals:   11/26/21 0931 11/26/21 1001  BP: (!) 152/61 (!) 159/61  Pulse: (!) 56 (!) 58  Resp: (!) 21 18  Temp:    SpO2: 98%    No  intake/output data recorded.  Intake/Output Summary (Last 24 hours) at 11/26/2021 1037 Last data filed at 11/26/2021 0402 Gross per 24 hour  Intake --  Output 600 ml  Net -600 ml   Constitutional: well-appearing, no acute distress ENMT: ears and nose without scars or lesions, MMM CV: normal rate, no edema Respiratory: bilateral chest rise, normal work of breathing Gastrointestinal: soft, non-tender, no palpable masses or hernias Skin: no visible lesions or rashes Psych: alert, judgement/insight appropriate, appropriate mood and affect   Test Results I personally reviewed new and old clinical labs and radiology tests Lab Results  Component Value Date   NA 137 11/26/2021   K 3.5 11/26/2021   CL 101 11/26/2021   CO2 25 11/26/2021   BUN 21 11/26/2021   CREATININE 2.84 (H) 11/26/2021   CALCIUM 8.5 (L) 11/26/2021   ALBUMIN 2.7 (L) 11/26/2021   PHOS 3.1 11/24/2021    CBC Recent Labs  Lab 11/24/21 0504 11/25/21 0244 11/26/21 0722  WBC 3.9* 3.2* 3.0*  NEUTROABS 2.5 2.1 1.7  HGB 7.8* 7.5* 7.4*  HCT 22.9* 21.7* 21.7*  MCV 102.7* 101.9* 101.9*  PLT 129* 120* 111*

## 2021-11-26 NOTE — Progress Notes (Signed)
Received patient in bed, alert and oriented. Informed consent signed and in chart.  Time tx completed: 4 hours  HD treatment completed. Patient tolerated well. Fistula/Graft without signs and symptoms of complications. Patient transported back to the room, alert and orient and in no acute distress. Report given to bedside RN.  Total UF removed: 2.3 liters  Medication given: none  Post HD VS: BP 143/68 67 HR 56  Sat 99% RA RR 20 Temp 978.2 oral   Post HD weight: 84. 7 kg bed wt

## 2021-11-27 ENCOUNTER — Telehealth: Payer: Self-pay | Admitting: Nurse Practitioner

## 2021-11-27 NOTE — Telephone Encounter (Signed)
Transition of care contact from inpatient facility  Date of Discharge: 11/26/2021 Date of Contact: 11/27/2021 Method of contact: Phone  Attempted to contact patient to discuss transition of care from inpatient admission. Patient did not answer the phone. Unable to leave message as patient has voicemail that hasn't been set up yet.

## 2021-11-28 DIAGNOSIS — G4733 Obstructive sleep apnea (adult) (pediatric): Secondary | ICD-10-CM | POA: Diagnosis not present

## 2021-11-28 DIAGNOSIS — A4181 Sepsis due to Enterococcus: Secondary | ICD-10-CM | POA: Diagnosis not present

## 2021-11-28 DIAGNOSIS — N390 Urinary tract infection, site not specified: Secondary | ICD-10-CM | POA: Diagnosis not present

## 2021-11-28 DIAGNOSIS — I714 Abdominal aortic aneurysm, without rupture, unspecified: Secondary | ICD-10-CM | POA: Diagnosis not present

## 2021-11-28 DIAGNOSIS — D61818 Other pancytopenia: Secondary | ICD-10-CM | POA: Diagnosis not present

## 2021-11-28 DIAGNOSIS — M19042 Primary osteoarthritis, left hand: Secondary | ICD-10-CM | POA: Diagnosis not present

## 2021-11-28 DIAGNOSIS — K2970 Gastritis, unspecified, without bleeding: Secondary | ICD-10-CM | POA: Diagnosis not present

## 2021-11-28 DIAGNOSIS — G2581 Restless legs syndrome: Secondary | ICD-10-CM | POA: Diagnosis not present

## 2021-11-28 DIAGNOSIS — E785 Hyperlipidemia, unspecified: Secondary | ICD-10-CM | POA: Diagnosis not present

## 2021-11-28 DIAGNOSIS — E871 Hypo-osmolality and hyponatremia: Secondary | ICD-10-CM | POA: Diagnosis not present

## 2021-11-28 DIAGNOSIS — I495 Sick sinus syndrome: Secondary | ICD-10-CM | POA: Diagnosis not present

## 2021-11-28 DIAGNOSIS — D631 Anemia in chronic kidney disease: Secondary | ICD-10-CM | POA: Diagnosis not present

## 2021-11-28 DIAGNOSIS — I480 Paroxysmal atrial fibrillation: Secondary | ICD-10-CM | POA: Diagnosis not present

## 2021-11-28 DIAGNOSIS — F329 Major depressive disorder, single episode, unspecified: Secondary | ICD-10-CM | POA: Diagnosis not present

## 2021-11-28 DIAGNOSIS — K219 Gastro-esophageal reflux disease without esophagitis: Secondary | ICD-10-CM | POA: Diagnosis not present

## 2021-11-28 DIAGNOSIS — F419 Anxiety disorder, unspecified: Secondary | ICD-10-CM | POA: Diagnosis not present

## 2021-11-28 DIAGNOSIS — I132 Hypertensive heart and chronic kidney disease with heart failure and with stage 5 chronic kidney disease, or end stage renal disease: Secondary | ICD-10-CM | POA: Diagnosis not present

## 2021-11-28 DIAGNOSIS — I499 Cardiac arrhythmia, unspecified: Secondary | ICD-10-CM | POA: Diagnosis not present

## 2021-11-28 DIAGNOSIS — N186 End stage renal disease: Secondary | ICD-10-CM | POA: Diagnosis not present

## 2021-11-28 DIAGNOSIS — K760 Fatty (change of) liver, not elsewhere classified: Secondary | ICD-10-CM | POA: Diagnosis not present

## 2021-11-28 DIAGNOSIS — R6520 Severe sepsis without septic shock: Secondary | ICD-10-CM | POA: Diagnosis not present

## 2021-11-28 DIAGNOSIS — K7030 Alcoholic cirrhosis of liver without ascites: Secondary | ICD-10-CM | POA: Diagnosis not present

## 2021-11-28 DIAGNOSIS — I5032 Chronic diastolic (congestive) heart failure: Secondary | ICD-10-CM | POA: Diagnosis not present

## 2021-11-28 DIAGNOSIS — M19041 Primary osteoarthritis, right hand: Secondary | ICD-10-CM | POA: Diagnosis not present

## 2021-11-28 DIAGNOSIS — G4700 Insomnia, unspecified: Secondary | ICD-10-CM | POA: Diagnosis not present

## 2021-11-28 LAB — CULTURE, BLOOD (ROUTINE X 2)
Culture: NO GROWTH
Culture: NO GROWTH
Special Requests: ADEQUATE
Special Requests: ADEQUATE

## 2021-11-29 DIAGNOSIS — Z992 Dependence on renal dialysis: Secondary | ICD-10-CM | POA: Diagnosis not present

## 2021-11-29 DIAGNOSIS — D631 Anemia in chronic kidney disease: Secondary | ICD-10-CM | POA: Diagnosis not present

## 2021-11-29 DIAGNOSIS — D509 Iron deficiency anemia, unspecified: Secondary | ICD-10-CM | POA: Diagnosis not present

## 2021-11-29 DIAGNOSIS — N186 End stage renal disease: Secondary | ICD-10-CM | POA: Diagnosis not present

## 2021-11-29 DIAGNOSIS — N2581 Secondary hyperparathyroidism of renal origin: Secondary | ICD-10-CM | POA: Diagnosis not present

## 2021-11-30 ENCOUNTER — Emergency Department (HOSPITAL_COMMUNITY)
Admission: EM | Admit: 2021-11-30 | Discharge: 2021-11-30 | Disposition: A | Discharge status: Home / Self Care | Payer: Medicare Other | Attending: Emergency Medicine | Admitting: Emergency Medicine

## 2021-11-30 ENCOUNTER — Other Ambulatory Visit: Payer: Self-pay

## 2021-11-30 ENCOUNTER — Emergency Department (HOSPITAL_COMMUNITY): Payer: Medicare Other

## 2021-11-30 DIAGNOSIS — Z79899 Other long term (current) drug therapy: Secondary | ICD-10-CM | POA: Insufficient documentation

## 2021-11-30 DIAGNOSIS — I5032 Chronic diastolic (congestive) heart failure: Secondary | ICD-10-CM | POA: Diagnosis not present

## 2021-11-30 DIAGNOSIS — D61818 Other pancytopenia: Secondary | ICD-10-CM | POA: Diagnosis not present

## 2021-11-30 DIAGNOSIS — Z992 Dependence on renal dialysis: Secondary | ICD-10-CM | POA: Insufficient documentation

## 2021-11-30 DIAGNOSIS — A4181 Sepsis due to Enterococcus: Secondary | ICD-10-CM | POA: Diagnosis not present

## 2021-11-30 DIAGNOSIS — I951 Orthostatic hypotension: Secondary | ICD-10-CM | POA: Insufficient documentation

## 2021-11-30 DIAGNOSIS — N186 End stage renal disease: Secondary | ICD-10-CM | POA: Diagnosis not present

## 2021-11-30 DIAGNOSIS — N390 Urinary tract infection, site not specified: Secondary | ICD-10-CM | POA: Diagnosis not present

## 2021-11-30 DIAGNOSIS — I132 Hypertensive heart and chronic kidney disease with heart failure and with stage 5 chronic kidney disease, or end stage renal disease: Secondary | ICD-10-CM | POA: Diagnosis not present

## 2021-11-30 DIAGNOSIS — I959 Hypotension, unspecified: Secondary | ICD-10-CM | POA: Diagnosis not present

## 2021-11-30 DIAGNOSIS — R6520 Severe sepsis without septic shock: Secondary | ICD-10-CM | POA: Diagnosis not present

## 2021-11-30 DIAGNOSIS — I120 Hypertensive chronic kidney disease with stage 5 chronic kidney disease or end stage renal disease: Secondary | ICD-10-CM | POA: Diagnosis not present

## 2021-11-30 DIAGNOSIS — Z4802 Encounter for removal of sutures: Secondary | ICD-10-CM | POA: Diagnosis not present

## 2021-11-30 LAB — LACTIC ACID, PLASMA: Lactic Acid, Venous: 1.4 mmol/L (ref 0.5–1.9)

## 2021-11-30 LAB — CBC WITH DIFFERENTIAL/PLATELET
Abs Immature Granulocytes: 0.01 10*3/uL (ref 0.00–0.07)
Basophils Absolute: 0 10*3/uL (ref 0.0–0.1)
Basophils Relative: 1 %
Eosinophils Absolute: 0 10*3/uL (ref 0.0–0.5)
Eosinophils Relative: 0 %
HCT: 24.5 % — ABNORMAL LOW (ref 39.0–52.0)
Hemoglobin: 8.2 g/dL — ABNORMAL LOW (ref 13.0–17.0)
Immature Granulocytes: 0 %
Lymphocytes Relative: 34 %
Lymphs Abs: 1 10*3/uL (ref 0.7–4.0)
MCH: 36.1 pg — ABNORMAL HIGH (ref 26.0–34.0)
MCHC: 33.5 g/dL (ref 30.0–36.0)
MCV: 107.9 fL — ABNORMAL HIGH (ref 80.0–100.0)
Monocytes Absolute: 0.5 10*3/uL (ref 0.1–1.0)
Monocytes Relative: 16 %
Neutro Abs: 1.4 10*3/uL — ABNORMAL LOW (ref 1.7–7.7)
Neutrophils Relative %: 49 %
Platelets: 145 10*3/uL — ABNORMAL LOW (ref 150–400)
RBC: 2.27 MIL/uL — ABNORMAL LOW (ref 4.22–5.81)
RDW: 18.3 % — ABNORMAL HIGH (ref 11.5–15.5)
WBC: 2.9 10*3/uL — ABNORMAL LOW (ref 4.0–10.5)
nRBC: 0 % (ref 0.0–0.2)

## 2021-11-30 LAB — COMPREHENSIVE METABOLIC PANEL
ALT: 19 U/L (ref 0–44)
AST: 30 U/L (ref 15–41)
Albumin: 3.2 g/dL — ABNORMAL LOW (ref 3.5–5.0)
Alkaline Phosphatase: 131 U/L — ABNORMAL HIGH (ref 38–126)
Anion gap: 10 (ref 5–15)
BUN: 19 mg/dL (ref 8–23)
CO2: 32 mmol/L (ref 22–32)
Calcium: 8.7 mg/dL — ABNORMAL LOW (ref 8.9–10.3)
Chloride: 93 mmol/L — ABNORMAL LOW (ref 98–111)
Creatinine, Ser: 3.16 mg/dL — ABNORMAL HIGH (ref 0.61–1.24)
GFR, Estimated: 19 mL/min — ABNORMAL LOW (ref >60)
Glucose, Bld: 97 mg/dL (ref 70–99)
Potassium: 3.7 mmol/L (ref 3.5–5.1)
Sodium: 135 mmol/L (ref 135–145)
Total Bilirubin: 0.9 mg/dL (ref 0.3–1.2)
Total Protein: 5.4 g/dL — ABNORMAL LOW (ref 6.5–8.1)

## 2021-11-30 LAB — URINALYSIS, ROUTINE W REFLEX MICROSCOPIC
Bacteria, UA: NONE SEEN
Bilirubin Urine: NEGATIVE
Glucose, UA: NEGATIVE mg/dL
Hgb urine dipstick: NEGATIVE
Ketones, ur: NEGATIVE mg/dL
Leukocytes,Ua: NEGATIVE
Nitrite: NEGATIVE
Protein, ur: 30 mg/dL — AB
Specific Gravity, Urine: 1.008 (ref 1.005–1.030)
pH: 9 — ABNORMAL HIGH (ref 5.0–8.0)

## 2021-11-30 MED ORDER — SODIUM CHLORIDE 0.9 % IV BOLUS
500.0000 mL | Freq: Once | INTRAVENOUS | Status: AC
  Administered 2021-11-30: 500 mL via INTRAVENOUS

## 2021-11-30 NOTE — ED Notes (Signed)
Patient is going home via [employer]. Patient is paying for taxi.

## 2021-11-30 NOTE — ED Provider Notes (Signed)
[facility] EMERGENCY DEPARTMENT Provider Note   CSN: [id number] Arrival date & time: 11/30/21  1425     History  Chief Complaint  Patient presents with   Hypotension    Darryl Diaz is a 81 y.o. male.  Pt is a 81 yo male with a pmhx significant for ESRD on HD MWF, p. Afib, HTN, CHF, chronic hyponatremia, and chronic pancytopenia.  Pt has had several falls recently.  He is not anticoagulated due to the falls.  Pt required 3 staples on 7/14 and they are still in place.  He was hospitalized for sepsis from a UTI from 7/18-21.  He was treated with fosfomycin for E. Faecalis.  He is on dialysis MWF and had normal dialysis yesterday.  Physical therapy came out today for a session; checked his bp; and called EMS.  Initial SBP 76.  Pt was asymptomatic.  He said he feels well now.  No new pain.  No fevers.         Home Medications Prior to Admission medications   Medication Sig Start Date End Date Taking? Authorizing Provider  acetaminophen (TYLENOL) 325 MG tablet Take 2 tablets (650 mg total) by mouth every 4 (four) hours as needed for headache or mild pain. Patient taking differently: Take 650 mg by mouth as needed for headache. 03/10/20   Angiulli, Lavon Paganini, PA-C  amiodarone (PACERONE) 200 MG tablet TAKE 1 TABLET BY MOUTH ONCE DAILY Patient taking differently: Take 200 mg by mouth every morning. 05/17/21   Richardson Dopp T, PA-C  atorvastatin (LIPITOR) 20 MG tablet Take 1 tablet (20 mg total) by mouth daily. 03/10/20   Angiulli, Lavon Paganini, PA-C  carvedilol (COREG) 6.25 MG tablet Take 1 tablet (6.25 mg total) by mouth 2 (two) times daily. Patient taking differently: Take 12.5 mg by mouth daily. 02/11/21   Sherren Mocha, MD  cetirizine (ZYRTEC) 10 MG tablet Take 10 mg by mouth daily.    [provider]  escitalopram (LEXAPRO) 10 MG tablet Take 1 tablet (10 mg total) by mouth daily. Patient taking differently: Take 10 mg by mouth every morning. 03/10/20    Angiulli, Lavon Paganini, PA-C  ferrous sulfate 324 MG TBEC Take 324 mg by mouth every morning.    [provider]  FOLIC ACID PO Take 1 tablet by mouth every morning.    [provider]  Magnesium Oxide 400 MG CAPS Take 1 capsule (400 mg total) by mouth daily. 10/17/20   Eugenie Filler, MD  Multiple Vitamin (MULTIVITAMIN PO) Take 1 tablet by mouth daily.    [provider]  nitroGLYCERIN (NITROSTAT) 0.4 MG SL tablet Place 1 tablet (0.4 mg total) under the tongue every 5 (five) minutes x 3 doses as needed for chest pain. 07/28/20   Baldwin Jamaica, PA-C  pantoprazole (PROTONIX) 40 MG tablet Take 1 tablet (40 mg total) by mouth daily. Patient taking differently: Take 40 mg by mouth every morning. 03/10/20   Angiulli, Lavon Paganini, PA-C  sodium bicarbonate 650 MG tablet Take 650 mg by mouth daily.    [provider]  tamsulosin (FLOMAX) 0.4 MG CAPS capsule Take 0.4 mg by mouth at bedtime.    [provider]  torsemide (DEMADEX) 20 MG tablet Take 60 mg by mouth daily.    [provider]  traZODone (DESYREL) 50 MG tablet Take 1 tablet (50 mg total) by mouth at bedtime. 11/26/21   Alma Friendly, MD  vitamin B-12 (CYANOCOBALAMIN) 500 MCG tablet  Take 1 tablet (500 mcg total) by mouth daily. Patient not taking: Reported on 09/29/2021 06/11/21   Bonnielee Haff, MD      Allergies    Penicillin g sodium and Penicillins    Review of Systems   Review of Systems  All other systems reviewed and are negative.   Physical Exam Updated Vital Signs BP (!) 146/58   Pulse 63   Temp 98.6 F (37 C) (Oral)   Resp (!) 24   SpO2 97%  Physical Exam Vitals and nursing note reviewed.  Constitutional:      Appearance: Normal appearance.  HENT:     Head: Normocephalic.     Comments: Healed wound posterior scalp with 3 staples    Right Ear: External ear normal.     Left Ear: External ear normal.     Nose: Nose normal.     Mouth/Throat:     Mouth: Mucous  membranes are moist.     Pharynx: Oropharynx is clear.  Eyes:     Extraocular Movements: Extraocular movements intact.     Conjunctiva/sclera: Conjunctivae normal.     Pupils: Pupils are equal, round, and reactive to light.  Cardiovascular:     Rate and Rhythm: Normal rate and regular rhythm.     Pulses: Normal pulses.     Heart sounds: Normal heart sounds.  Pulmonary:     Effort: Pulmonary effort is normal.     Breath sounds: Normal breath sounds.  Abdominal:     General: Abdomen is flat. Bowel sounds are normal.     Palpations: Abdomen is soft.  Musculoskeletal:        General: Normal range of motion.     Cervical back: Normal range of motion and neck supple.     Comments: Left upper arm AVF with good thrill  Skin:    General: Skin is warm.     Capillary Refill: Capillary refill takes less than 2 seconds.  Neurological:     General: No focal deficit present.     Mental Status: He is alert and oriented to person, place, and time.  Psychiatric:        Mood and Affect: Mood normal.        Behavior: Behavior normal.     ED Results / Procedures / Treatments   Labs (all labs ordered are listed, but only abnormal results are displayed) Labs Reviewed  COMPREHENSIVE METABOLIC PANEL - Abnormal; Notable for the following components:      Result Value   Chloride 93 (*)    Creatinine, Ser 3.16 (*)    Calcium 8.7 (*)    Total Protein 5.4 (*)    Albumin 3.2 (*)    Alkaline Phosphatase 131 (*)    GFR, Estimated 19 (*)    All other components within normal limits  CBC WITH DIFFERENTIAL/PLATELET - Abnormal; Notable for the following components:   WBC 2.9 (*)    RBC 2.27 (*)    Hemoglobin 8.2 (*)    HCT 24.5 (*)    MCV 107.9 (*)    MCH 36.1 (*)    RDW 18.3 (*)    Platelets 145 (*)    Neutro Abs 1.4 (*)    All other components within normal limits  URINALYSIS, ROUTINE W REFLEX MICROSCOPIC - Abnormal; Notable for the following components:   pH 9.0 (*)    Protein, ur 30 (*)     All other components within normal limits  CULTURE, BLOOD (ROUTINE X 2)  CULTURE,  BLOOD (ROUTINE X 2)  URINE CULTURE  LACTIC ACID, PLASMA  LACTIC ACID, PLASMA    EKG EKG Interpretation  Date/Time:  Tuesday November 30 2021 14:34:56 EDT Ventricular Rate:  63 PR Interval:  188 QRS Duration: 122 QT Interval:  508 QTC Calculation: 521 R Axis:   -34 Text Interpretation: Sinus rhythm Nonspecific intraventricular conduction delay No significant change since last tracing Confirmed by [name] [phone]) on 11/30/2021 3:42:45 PM  Radiology DG Chest Portable 1 View  Result Date: 11/30/2021 CLINICAL DATA:  hypotension EXAM: PORTABLE CHEST 1 VIEW COMPARISON:  Chest x-ray 11/23/2021 FINDINGS: Stable cardiomegaly-some component likely due to AP portable technique. The heart and mediastinal contours are unchanged. Aortic calcification. No focal consolidation. No pulmonary edema. No pleural effusion. No pneumothorax. No acute osseous abnormality. IMPRESSION: 1. No active disease. 2.  Aortic Atherosclerosis (ICD10-I70.0). Electronically Signed   By: Iven Finn M.D.   On: 11/30/2021 15:53    Procedures .Suture Removal  Date/Time: 11/30/2021 4:47 PM  Performed by: [name], MD Authorized by: [name], MD   Consent:    Consent obtained:  Verbal   Consent given by:  Patient   Risks, benefits, and alternatives were discussed: yes     Alternatives discussed:  No treatment Universal protocol:    Patient identity confirmed:  Verbally with patient Location:    Location:  [place] location:  Scalp Procedure details:    Wound appearance:  No signs of infection and good wound healing   Number of staples removed:  3 Post-procedure details:    Post-removal:  No dressing applied   Procedure completion:  Tolerated well, no immediate complications     Medications Ordered in ED Medications  sodium chloride 0.9 % bolus 500 mL (500 mLs Intravenous New Bag/Given 11/30/21  1622)    ED Course/ Medical Decision Making/ A&P                           Medical Decision Making Amount and/or Complexity of Data Reviewed Labs: ordered. Radiology: ordered.   This patient presents to the ED for concern of hypotension, this involves an extensive number of treatment options, and is a complaint that carries with it a high risk of complications and morbidity.  The differential diagnosis includes sepsis, electrolyte abn, anemia, infection, dehydration, dialysis problem   Co morbidities that complicate the patient evaluation  HD MWF, p. Afib, HTN, CHF, chronic hyponatremia, and chronic pancytopenia.   Additional history obtained:  Additional history obtained from epic chart review External records from outside source obtained and reviewed including EMS report   Lab Tests:  I Ordered, and personally interpreted labs.  The pertinent results include:  cbc with pancytopenia (wbc 2.9, hgb 8.2, hct 24.5, plt 145).  This is chronic for pt; cmp with cr 3.16 (chronic); ua neg   Imaging Studies ordered:  I ordered imaging studies including cxr  I independently visualized and interpreted imaging which showed  IMPRESSION:  1. No active disease.  2.  Aortic Atherosclerosis (ICD10-I70.0).   I agree with the radiologist interpretation   Cardiac Monitoring:  The patient was maintained on a cardiac monitor.  I personally viewed and interpreted the cardiac monitored which showed an underlying rhythm of: nsr   Medicines ordered and prescription drug management:  I ordered medication including ivfs  for fluids  Reevaluation of the patient after these medicines showed that the patient improved I have reviewed the patients home medicines  and have made adjustments as needed    Critical Interventions:  ivfs    Problem List / ED Course:  Hypotension at home:  pt has not been hypotensive while here.  He is mildly orthostatic prior to fluids, but felt well.  He is  afebrile.  His uti has been cleared up.  He feels good.  He is stable for d/c.  Return if worse.  F/u with pcp.   Reevaluation:  After the interventions noted above, I reevaluated the patient and found that they have :improved   Social Determinants of Health:  Lives alone   Dispostion:  After consideration of the diagnostic results and the patients response to treatment, I feel that the patent would benefit from discharge with outpatient f/u.          Final Clinical Impression(s) / ED Diagnoses Final diagnoses:  Orthostatic hypotension  Encounter for staple removal  ESRD on hemodialysis ([place])  Pancytopenia ([place])    Rx / DC Orders ED Discharge Orders     None         [name], MD 11/30/21 2016

## 2021-11-30 NOTE — ED Triage Notes (Signed)
[facility]. Report they were called by home PT for hypotension. First time this PT has seen him. Mon/wed/fri HD pt. Got HD yesterday. Asymptomatic. SBP 76-> 126 per EMS.

## 2021-12-01 DIAGNOSIS — D509 Iron deficiency anemia, unspecified: Secondary | ICD-10-CM | POA: Diagnosis not present

## 2021-12-01 DIAGNOSIS — Z992 Dependence on renal dialysis: Secondary | ICD-10-CM | POA: Diagnosis not present

## 2021-12-01 DIAGNOSIS — D631 Anemia in chronic kidney disease: Secondary | ICD-10-CM | POA: Diagnosis not present

## 2021-12-01 DIAGNOSIS — N2581 Secondary hyperparathyroidism of renal origin: Secondary | ICD-10-CM | POA: Diagnosis not present

## 2021-12-01 DIAGNOSIS — N186 End stage renal disease: Secondary | ICD-10-CM | POA: Diagnosis not present

## 2021-12-02 ENCOUNTER — Emergency Department (HOSPITAL_COMMUNITY): Payer: Medicare Other

## 2021-12-02 ENCOUNTER — Emergency Department (HOSPITAL_COMMUNITY)
Admission: EM | Admit: 2021-12-02 | Discharge: 2021-12-06 | Disposition: A | Discharge status: Home / Self Care | Payer: Medicare Other | Attending: Emergency Medicine | Admitting: Emergency Medicine

## 2021-12-02 DIAGNOSIS — R531 Weakness: Secondary | ICD-10-CM | POA: Insufficient documentation

## 2021-12-02 DIAGNOSIS — I959 Hypotension, unspecified: Secondary | ICD-10-CM | POA: Diagnosis not present

## 2021-12-02 DIAGNOSIS — I6381 Other cerebral infarction due to occlusion or stenosis of small artery: Secondary | ICD-10-CM | POA: Diagnosis not present

## 2021-12-02 DIAGNOSIS — Z992 Dependence on renal dialysis: Secondary | ICD-10-CM | POA: Insufficient documentation

## 2021-12-02 DIAGNOSIS — Z9181 History of falling: Secondary | ICD-10-CM | POA: Diagnosis not present

## 2021-12-02 DIAGNOSIS — Z79899 Other long term (current) drug therapy: Secondary | ICD-10-CM | POA: Insufficient documentation

## 2021-12-02 DIAGNOSIS — R6520 Severe sepsis without septic shock: Secondary | ICD-10-CM | POA: Diagnosis not present

## 2021-12-02 DIAGNOSIS — N186 End stage renal disease: Secondary | ICD-10-CM | POA: Insufficient documentation

## 2021-12-02 DIAGNOSIS — I132 Hypertensive heart and chronic kidney disease with heart failure and with stage 5 chronic kidney disease, or end stage renal disease: Secondary | ICD-10-CM | POA: Diagnosis not present

## 2021-12-02 DIAGNOSIS — I5030 Unspecified diastolic (congestive) heart failure: Secondary | ICD-10-CM | POA: Diagnosis not present

## 2021-12-02 DIAGNOSIS — I5032 Chronic diastolic (congestive) heart failure: Secondary | ICD-10-CM | POA: Diagnosis not present

## 2021-12-02 DIAGNOSIS — J9811 Atelectasis: Secondary | ICD-10-CM | POA: Diagnosis not present

## 2021-12-02 DIAGNOSIS — R4182 Altered mental status, unspecified: Secondary | ICD-10-CM | POA: Diagnosis not present

## 2021-12-02 DIAGNOSIS — R2681 Unsteadiness on feet: Secondary | ICD-10-CM | POA: Diagnosis not present

## 2021-12-02 DIAGNOSIS — N390 Urinary tract infection, site not specified: Secondary | ICD-10-CM | POA: Diagnosis not present

## 2021-12-02 DIAGNOSIS — R001 Bradycardia, unspecified: Secondary | ICD-10-CM | POA: Insufficient documentation

## 2021-12-02 DIAGNOSIS — A4181 Sepsis due to Enterococcus: Secondary | ICD-10-CM | POA: Diagnosis not present

## 2021-12-02 NOTE — ED Provider Notes (Signed)
Assumed care of patient at shift change pending labs and disposition.  Please see prior provider note for full H&P.  Briefly patient is an 81 year old male who presented to the ED tonight due to not feeling well at home and having trouble caring for himself.  Recent admission for UTI, discharged about a week ago with plans for home health and assistance from family however has not been doing well with this..  Today's anemia and leukopenia are similar to prior, CMP consistent with ESRD on dialysis not hyperkalemic at this time, magnesium, phosphorus, and ammonia levels are normal.  Urinalysis and urine culture are pending.  Patient does still make urine.  No acute intracranial findings on CT head. Mild cardiomegaly with mild stable bibasilar atelectasis.  Remains pending urinalysis. Attending discussed with nephrology to get patient set up for dialysis as he will likely be boarding in the ED for [facility] evaluation.   PT & TOC consults placed.   Patient care transitioned to AM ED team @ shift change.    Leafy Kindle 12/03/21 0086    Quintella Reichert, MD 12/04/21 2311

## 2021-12-02 NOTE — ED Provider Notes (Signed)
With [facility] Provider Note   CSN: [id number] Arrival date & time: 12/02/21  2122     History  Chief Complaint  Patient presents with   Medical Clearance    Darryl Diaz is a 81 y.o. male.  HPI  Patient is an 81 year old male with history of ESRD on HD MWF, A-fib (no current AC due to history of falls), HTN, pancytopenia, diastolic HF (EF 70-26% 07/7856), chronic hyponatremia who presents emergency department via EMS for evaluation of fatigue.  Patient states that yesterday he completed his entire dialysis session, however this morning he woke up from sleep and stated that he feels more generalized fatigue than his baseline.  He denied any chest pain or shortness of breath.  He denies any falls or head trauma.  He currently states that he still feels somewhat fatigued but states that he feels like he is at his baseline.  Per old records patient was admitted to the hospital from 7/18 through 11/26/2021 in the setting of generalized weakness.  Patient was found to have sepsis secondary to urinary tract infection.  He was treated with IV Rocephin which was subsequently switched to fosfomycin on 11/26/2021.  His hemoglobin was found to be low at 7.7, with his baseline being 9-11.     Home Medications Prior to Admission medications   Medication Sig Start Date End Date Taking? Authorizing Provider  acetaminophen (TYLENOL) 325 MG tablet Take 2 tablets (650 mg total) by mouth every 4 (four) hours as needed for headache or mild pain. Patient taking differently: Take 650 mg by mouth as needed for headache. 03/10/20   Angiulli, Lavon Paganini, PA-C  amiodarone (PACERONE) 200 MG tablet TAKE 1 TABLET BY MOUTH ONCE DAILY Patient taking differently: Take 200 mg by mouth every morning. 05/17/21   Richardson Dopp T, PA-C  atorvastatin (LIPITOR) 20 MG tablet Take 1 tablet (20 mg total) by mouth daily. 03/10/20   Angiulli, Lavon Paganini, PA-C  carvedilol (COREG) 6.25 MG tablet  Take 1 tablet (6.25 mg total) by mouth 2 (two) times daily. Patient taking differently: Take 12.5 mg by mouth daily. 02/11/21   Sherren Mocha, MD  cetirizine (ZYRTEC) 10 MG tablet Take 10 mg by mouth daily.    [provider]  escitalopram (LEXAPRO) 10 MG tablet Take 1 tablet (10 mg total) by mouth daily. Patient taking differently: Take 10 mg by mouth every morning. 03/10/20   Angiulli, Lavon Paganini, PA-C  ferrous sulfate 324 MG TBEC Take 324 mg by mouth every morning.    [provider]  FOLIC ACID PO Take 1 tablet by mouth every morning.    [provider]  Magnesium Oxide 400 MG CAPS Take 1 capsule (400 mg total) by mouth daily. 10/17/20   Eugenie Filler, MD  Multiple Vitamin (MULTIVITAMIN PO) Take 1 tablet by mouth daily.    [provider]  nitroGLYCERIN (NITROSTAT) 0.4 MG SL tablet Place 1 tablet (0.4 mg total) under the tongue every 5 (five) minutes x 3 doses as needed for chest pain. 07/28/20   Baldwin Jamaica, PA-C  pantoprazole (PROTONIX) 40 MG tablet Take 1 tablet (40 mg total) by mouth daily. Patient taking differently: Take 40 mg by mouth every morning. 03/10/20   Angiulli, Lavon Paganini, PA-C  sodium bicarbonate 650 MG tablet Take 650 mg by mouth daily.    [provider]  tamsulosin (FLOMAX) 0.4 MG CAPS capsule Take 0.4 mg by mouth at bedtime.    [provider]  torsemide (DEMADEX) 20 MG tablet Take 60 mg by mouth daily.    [provider]  traZODone (DESYREL) 50 MG tablet Take 1 tablet (50 mg total) by mouth at bedtime. 11/26/21   Alma Friendly, MD  vitamin B-12 (CYANOCOBALAMIN) 500 MCG tablet Take 1 tablet (500 mcg total) by mouth daily. Patient not taking: Reported on 09/29/2021 06/11/21   Bonnielee Haff, MD      Allergies    Penicillin g sodium and Penicillins    Review of Systems   Review of Systems  Physical Exam Updated Vital Signs BP (!) 104/47   Pulse (!) 58   Temp 97.9 F (36.6 C) (Oral)   Resp 18    Ht 6' (1.829 m)   Wt 84.7 kg   SpO2 91%   BMI 25.33 kg/m  Physical Exam Vitals and nursing note reviewed.  Constitutional:      General: He is not in acute distress.    Appearance: He is well-developed.     Comments: Chronically ill-appearing, elderly  HENT:     Head: Atraumatic.     Right Ear: External ear normal.     Left Ear: External ear normal.     Nose: Nose normal.     Mouth/Throat:     Mouth: Mucous membranes are moist.     Pharynx: Oropharynx is clear.  Eyes:     Extraocular Movements: Extraocular movements intact.     Conjunctiva/sclera: Conjunctivae normal.     Pupils: Pupils are equal, round, and reactive to light.  Cardiovascular:     Rate and Rhythm: Regular rhythm. Bradycardia present.     Heart sounds: No murmur heard. Pulmonary:     Effort: Pulmonary effort is normal. No respiratory distress.     Breath sounds: Normal breath sounds. No wheezing, rhonchi or rales.  Abdominal:     Palpations: Abdomen is soft.     Tenderness: There is no abdominal tenderness.  Musculoskeletal:        General: No swelling.     Cervical back: Neck supple.     Right lower leg: No edema.     Left lower leg: No edema.  Skin:    General: Skin is warm and dry.     Capillary Refill: Capillary refill takes less than 2 seconds.     Findings: No rash.  Neurological:     General: No focal deficit present.     Mental Status: He is alert and oriented to person, place, and time.  Psychiatric:        Mood and Affect: Mood normal.     ED Results / Procedures / Treatments   Labs (all labs ordered are listed, but only abnormal results are displayed) Labs Reviewed  CBC WITH DIFFERENTIAL/PLATELET - Abnormal; Notable for the following components:      Result Value   WBC 3.9 (*)    RBC 2.39 (*)    Hemoglobin 8.5 (*)    HCT 25.2 (*)    MCV 105.4 (*)    MCH 35.6 (*)    RDW 17.9 (*)    Neutro Abs 1.6 (*)    All other components within normal limits  COMPREHENSIVE METABOLIC PANEL  - Abnormal; Notable for the following components:   Sodium 132 (*)    Chloride 92 (*)    Creatinine, Ser 2.96 (*)    Calcium 8.3 (*)    Total Protein 5.5 (*)    Albumin 3.2 (*)    GFR, Estimated  21 (*)    All other components within normal limits  URINE CULTURE  AMMONIA  MAGNESIUM  PHOSPHORUS  URINALYSIS, ROUTINE W REFLEX MICROSCOPIC    EKG None  Radiology CT Head Wo Contrast  Result Date: 12/02/2021 CLINICAL DATA:  Mental status changes, unknown cause. EXAM: CT HEAD WITHOUT CONTRAST TECHNIQUE: Contiguous axial images were obtained from the base of the skull through the vertex without intravenous contrast. RADIATION DOSE REDUCTION: This exam was performed according to the departmental dose-optimization program which includes automated exposure control, adjustment of the mA and/or kV according to patient size and/or use of iterative reconstruction technique. COMPARISON:  Head CT 11/20/2021. FINDINGS: Brain: Again noted are moderate to severe cerebral atrophy with atrophic ventriculomegaly, mild-to-moderate cerebellar atrophy. There are moderately developed changes of small vessel disease of the cerebral white matter. A few tiny chronic bilateral gangliocapsular lacunar infarcts are redemonstrated. No new asymmetry is seen worrisome for an acute infarct, hemorrhage or mass. There is no midline shift.  The basal cisterns are clear. Vascular: There are calcifications in the distal vertebral arteries and both siphons but no hyperdense central vessels. Skull: Negative for fractures or focal lesions. Sinuses/Orbits: No acute findings.  Old right lens replacement. Other: None. IMPRESSION: Atrophy and small-vessel disease with no intracranial CT findings or interval changes. Chronic gangliocapsular lacunae. Electronically Signed   By: Telford Nab M.D.   On: 12/02/2021 23:34   DG Chest 1 View  Result Date: 12/02/2021 CLINICAL DATA:  Feeling unwell. EXAM: CHEST  1 VIEW COMPARISON:  November 30, 2021  FINDINGS: The cardiac silhouette is mildly enlarged and unchanged in size. Mild, stable atelectasis is seen within the bilateral lung bases. There is no evidence of focal consolidation, pleural effusion or pneumothorax. Chronic right-sided rib fractures are noted. IMPRESSION: Mild cardiomegaly with mild, stable bibasilar atelectasis. Electronically Signed   By: Virgina Norfolk M.D.   On: 12/02/2021 22:34    Procedures Procedures    Medications Ordered in ED Medications - No data to display  ED Course/ Medical Decision Making/ A&P                           Medical Decision Making Problems Addressed: Dialysis patient [facility]): chronic illness or injury with exacerbation, progression, or side effects of treatment Weakness: complicated acute illness or injury with systemic symptoms that poses a threat to life or bodily functions  Amount and/or Complexity of Data Reviewed Independent Historian: EMS External Data Reviewed: labs, radiology, ECG and notes. Labs: ordered. Radiology: ordered.   Patient is a 81 year old male presents emergency department as above.  On initial presentation patient's pulse is 59, his blood pressure is 104/47.  He is afebrile and saturating 90% on room air.  Patient was started on oxygen.  Patient does appear somewhat fatigued during the examination.  His right eyelid appears shot though he is able to open it with effort.  Patient did have a recent hospital admission in the setting of generalized weakness and was discharged 1 week ago.  Plans at that time were to have the patient discharged to a SNF, however patient wanted to go home and PT/OT with home health was ultimately ordered.  Baseline laboratory work and imaging studies will be ordered for this patient.  CT of the head was unremarkable for acute findings.  Chest x-ray showed mild cardiomegaly with stable bibasilar atelectasis.  At the time of handoff patient was pending additional pieces of his laboratory work-up  to  be completed.  Given patient is a dialysis Monday Wednesday Friday patient he would benefit from reaching out to nephrology should he be in the hospital until tomorrow morning so that he may receive his regularly scheduled dialysis.        Final Clinical Impression(s) / ED Diagnoses Final diagnoses:  Dialysis patient [facility])  Weakness    Rx / DC Orders ED Discharge Orders     None         Nelta Numbers, MD 12/03/21 0113    Margette Fast, MD 12/05/21 [id number]

## 2021-12-02 NOTE — ED Triage Notes (Signed)
BIB EMS, states that he is feeling weird. States that "I dont have anyone to take care of me at home and idk why I'm feeling this way"

## 2021-12-03 DIAGNOSIS — R531 Weakness: Secondary | ICD-10-CM | POA: Diagnosis not present

## 2021-12-03 LAB — COMPREHENSIVE METABOLIC PANEL
ALT: 18 U/L (ref 0–44)
AST: 33 U/L (ref 15–41)
Albumin: 3.2 g/dL — ABNORMAL LOW (ref 3.5–5.0)
Alkaline Phosphatase: 126 U/L (ref 38–126)
Anion gap: 15 (ref 5–15)
BUN: 17 mg/dL (ref 8–23)
CO2: 25 mmol/L (ref 22–32)
Calcium: 8.3 mg/dL — ABNORMAL LOW (ref 8.9–10.3)
Chloride: 92 mmol/L — ABNORMAL LOW (ref 98–111)
Creatinine, Ser: 2.96 mg/dL — ABNORMAL HIGH (ref 0.61–1.24)
GFR, Estimated: 21 mL/min — ABNORMAL LOW (ref >60)
Glucose, Bld: 75 mg/dL (ref 70–99)
Potassium: 3.6 mmol/L (ref 3.5–5.1)
Sodium: 132 mmol/L — ABNORMAL LOW (ref 135–145)
Total Bilirubin: 0.9 mg/dL (ref 0.3–1.2)
Total Protein: 5.5 g/dL — ABNORMAL LOW (ref 6.5–8.1)

## 2021-12-03 LAB — CBC WITH DIFFERENTIAL/PLATELET
Abs Immature Granulocytes: 0.01 10*3/uL (ref 0.00–0.07)
Basophils Absolute: 0 10*3/uL (ref 0.0–0.1)
Basophils Relative: 1 %
Eosinophils Absolute: 0 10*3/uL (ref 0.0–0.5)
Eosinophils Relative: 1 %
HCT: 25.2 % — ABNORMAL LOW (ref 39.0–52.0)
Hemoglobin: 8.5 g/dL — ABNORMAL LOW (ref 13.0–17.0)
Immature Granulocytes: 0 %
Lymphocytes Relative: 40 %
Lymphs Abs: 1.6 10*3/uL (ref 0.7–4.0)
MCH: 35.6 pg — ABNORMAL HIGH (ref 26.0–34.0)
MCHC: 33.7 g/dL (ref 30.0–36.0)
MCV: 105.4 fL — ABNORMAL HIGH (ref 80.0–100.0)
Monocytes Absolute: 0.6 10*3/uL (ref 0.1–1.0)
Monocytes Relative: 16 %
Neutro Abs: 1.6 10*3/uL — ABNORMAL LOW (ref 1.7–7.7)
Neutrophils Relative %: 42 %
Platelets: 156 10*3/uL (ref 150–400)
RBC: 2.39 MIL/uL — ABNORMAL LOW (ref 4.22–5.81)
RDW: 17.9 % — ABNORMAL HIGH (ref 11.5–15.5)
WBC: 3.9 10*3/uL — ABNORMAL LOW (ref 4.0–10.5)
nRBC: 0 % (ref 0.0–0.2)

## 2021-12-03 LAB — URINALYSIS, ROUTINE W REFLEX MICROSCOPIC
Bacteria, UA: NONE SEEN
Bilirubin Urine: NEGATIVE
Glucose, UA: NEGATIVE mg/dL
Ketones, ur: NEGATIVE mg/dL
Leukocytes,Ua: NEGATIVE
Nitrite: NEGATIVE
Protein, ur: NEGATIVE mg/dL
Specific Gravity, Urine: 1.005 (ref 1.005–1.030)
pH: 8 (ref 5.0–8.0)

## 2021-12-03 LAB — MAGNESIUM: Magnesium: 2.1 mg/dL (ref 1.7–2.4)

## 2021-12-03 LAB — AMMONIA: Ammonia: 25 umol/L (ref 9–35)

## 2021-12-03 LAB — PHOSPHORUS: Phosphorus: 4 mg/dL (ref 2.5–4.6)

## 2021-12-03 MED ORDER — TORSEMIDE 20 MG PO TABS
60.0000 mg | ORAL_TABLET | Freq: Every day | ORAL | Status: DC
  Administered 2021-12-03: 60 mg via ORAL
  Filled 2021-12-03: qty 3

## 2021-12-03 MED ORDER — CHLORHEXIDINE GLUCONATE CLOTH 2 % EX PADS
6.0000 | MEDICATED_PAD | Freq: Every day | CUTANEOUS | Status: DC
Start: 2021-12-04 — End: 2021-12-07

## 2021-12-03 MED ORDER — CARVEDILOL 12.5 MG PO TABS
12.5000 mg | ORAL_TABLET | Freq: Every day | ORAL | Status: DC
  Administered 2021-12-03: 12.5 mg via ORAL
  Filled 2021-12-03: qty 1

## 2021-12-03 MED ORDER — PANTOPRAZOLE SODIUM 40 MG PO TBEC
40.0000 mg | DELAYED_RELEASE_TABLET | Freq: Every morning | ORAL | Status: DC
  Administered 2021-12-03 – 2021-12-05 (×3): 40 mg via ORAL
  Filled 2021-12-03 (×3): qty 1

## 2021-12-03 MED ORDER — AMIODARONE HCL 200 MG PO TABS
200.0000 mg | ORAL_TABLET | Freq: Every day | ORAL | Status: DC
Start: 2021-12-03 — End: 2021-12-07
  Administered 2021-12-03 – 2021-12-05 (×3): 200 mg via ORAL
  Filled 2021-12-03 (×3): qty 1

## 2021-12-03 MED ORDER — ATORVASTATIN CALCIUM 10 MG PO TABS
20.0000 mg | ORAL_TABLET | Freq: Every day | ORAL | Status: DC
  Administered 2021-12-03 – 2021-12-05 (×3): 20 mg via ORAL
  Filled 2021-12-03 (×3): qty 2

## 2021-12-03 MED ORDER — FERROUS SULFATE 325 (65 FE) MG PO TABS
324.0000 mg | ORAL_TABLET | Freq: Every morning | ORAL | Status: DC
  Administered 2021-12-03: 324 mg via ORAL
  Administered 2021-12-04: 325 mg via ORAL
  Administered 2021-12-05: 324 mg via ORAL
  Filled 2021-12-03 (×3): qty 1

## 2021-12-03 MED ORDER — SODIUM BICARBONATE 650 MG PO TABS
650.0000 mg | ORAL_TABLET | Freq: Every day | ORAL | Status: DC
  Administered 2021-12-03 – 2021-12-05 (×3): 650 mg via ORAL
  Filled 2021-12-03 (×4): qty 1

## 2021-12-03 MED ORDER — TAMSULOSIN HCL 0.4 MG PO CAPS
0.4000 mg | ORAL_CAPSULE | Freq: Every day | ORAL | Status: DC
  Administered 2021-12-03 – 2021-12-06 (×4): 0.4 mg via ORAL
  Filled 2021-12-03 (×4): qty 1

## 2021-12-03 MED ORDER — TRAZODONE HCL 50 MG PO TABS
50.0000 mg | ORAL_TABLET | Freq: Every day | ORAL | Status: DC
Start: 2021-12-03 — End: 2021-12-07
  Administered 2021-12-03 – 2021-12-06 (×4): 50 mg via ORAL
  Filled 2021-12-03 (×4): qty 1

## 2021-12-03 MED ORDER — ESCITALOPRAM OXALATE 10 MG PO TABS
10.0000 mg | ORAL_TABLET | Freq: Every day | ORAL | Status: DC
  Administered 2021-12-03 – 2021-12-05 (×3): 10 mg via ORAL
  Filled 2021-12-03 (×3): qty 1

## 2021-12-03 MED ORDER — LORATADINE 10 MG PO TABS
10.0000 mg | ORAL_TABLET | Freq: Every day | ORAL | Status: DC
  Administered 2021-12-03 – 2021-12-05 (×3): 10 mg via ORAL
  Filled 2021-12-03 (×3): qty 1

## 2021-12-03 MED ORDER — MAGNESIUM OXIDE -MG SUPPLEMENT 400 (240 MG) MG PO TABS
400.0000 mg | ORAL_TABLET | Freq: Every day | ORAL | Status: DC
Start: 2021-12-03 — End: 2021-12-07
  Administered 2021-12-03 – 2021-12-05 (×3): 400 mg via ORAL
  Filled 2021-12-03 (×3): qty 1

## 2021-12-03 MED ORDER — FOLIC ACID 1 MG PO TABS
1.0000 mg | ORAL_TABLET | Freq: Every morning | ORAL | Status: DC
  Administered 2021-12-03 – 2021-12-05 (×3): 1 mg via ORAL
  Filled 2021-12-03 (×3): qty 1

## 2021-12-03 NOTE — NC FL2 (Signed)
[place] LEVEL OF CARE SCREENING TOOL     IDENTIFICATION  Patient Name: Darryl Diaz Birthdate: 26-Nov-1940 Sex: male Admission Date (Current Location): 12/02/2021  [facility] and [place] Number:  Herbalist and Address:  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   Acute hyponatremia 09/27/2021   Incidental finding of COVID-19 virus infection 06/07/2021   Dependence on renal dialysis ([place]) 05/25/2021   Non-pressure chronic ulcer of other part of left foot with unspecified severity ([place]) 05/25/2021   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[place]) 05/25/2021   Osteomyelitis of great toe of left foot ([place])    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[place]) 04/13/2021   Cellulitis of right foot 01/17/2021   Normocytic anemia 01/17/2021   UTI (urinary tract infection) 01/17/2021   Acute GI bleeding 01/06/2021   Pancytopenia ([place]) 12/02/2020   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   Orthostatic hypotension 11/23/2020   Acute renal insufficiency 10/23/2020   Alcohol abuse 10/23/2020   Hypokalemia 10/23/2020   Hypomagnesemia 10/23/2020   Low back pain at multiple sites 10/23/2020   Malnutrition of mild degree Altamease Oiler: 75% to less than 90% of standard weight) ([place]) 10/23/2020   Open wound of scalp without complication 03/55/9741   Oral  candidiasis 10/23/2020   Cellulitis of left foot    Cellulitis of toe of left foot 10/13/2020   Skin ulcer of left great toe ([place]) 10/13/2020   CKD (chronic kidney disease) stage 4, 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/13/2020   Chronic diastolic CHF (congestive heart failure) ([place]) 10/13/2020   Transient weakness of left lower extremity 09/07/2020   Heme positive stool 05/14/2020   Abnormality of gait 04/27/2020   Salmonella food poisoning 63/84/5364   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   Yeast infection 03/16/2020   Diarrhea 03/16/2020   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 (HCC)    Allergies    Anemia, chronic disease    History of hypertension    Debility    Syncope and collapse 02/22/2020   AKI (acute kidney injury) ([place]) 02/10/2020   Unspecified protein-calorie malnutrition ([place]) 02/10/2020   Weakness 02/06/2020   History of repair of hip joint 10/15/2019   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   Pain in right foot 06/27/2019   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   Bradycardia 04/20/2019   Gastrointestinal hemorrhage 02/25/2019   Fall at home, initial encounter 02/25/2019   Bilateral lower extremity edema 02/25/2019   Chronic hyponatremia 02/25/2019   Fall 02/25/2019   Benign prostatic hyperplasia with lower urinary tract symptoms 12/19/2018   Iron deficiency anemia due to chronic blood loss 08/11/2018   Acute on chronic anemia 08/11/2018   Hardening of the aorta (main artery of the heart) ([place]) 08/11/2018   Benign neoplasm of colon 06/18/2018   History of adenomatous polyp of colon 08/10/2017   Impaired fasting glucose 06/10/2016  Encounter for general adult medical examination without abnormal findings 06/03/2016   Posterior dislocation of hip, closed ([place]) 04/30/2016   Hip fracture ([place]) 03/30/2016   Hypertension 03/30/2016   Hyperlipidemia 03/30/2016   Osteoarthritis 03/30/2016   Depressive disorder 03/30/2016   Rhabdomyolysis  03/30/2016   Leukocytosis 03/30/2016   Pressure ulcer 03/30/2016   Fracture of hip ([place]) 03/30/2016   GERD (gastroesophageal reflux disease) 12/17/2015   Paroxysmal atrial fibrillation ([place]) 12/17/2015   Primary hypertension 12/17/2015   Long term (current) use of anticoagulants 12/17/2015   Moderate major depression, single episode ([place]) 12/17/2015   Pure hypercholesterolemia 12/17/2015   Seasonal allergic rhinitis 12/17/2015   CAD (coronary artery disease) 08/21/2015    Orientation RESPIRATION BLADDER Height & Weight     Self, Time, Situation, Place  Normal Continent Weight: 186 lb 11.7 oz (84.7 kg) Height:  6' (182.9 cm)  BEHAVIORAL SYMPTOMS/MOOD NEUROLOGICAL BOWEL NUTRITION STATUS      Continent Diet (Regular)  AMBULATORY STATUS COMMUNICATION OF NEEDS Skin   Extensive Assist Verbally Normal                       Personal Care Assistance Level of Assistance  Bathing, Feeding, Dressing Bathing Assistance: Limited assistance Feeding assistance: Independent Dressing Assistance: Limited assistance     Functional Limitations Info  Sight, Hearing, Speech Sight Info: Impaired (Wears glasses) Hearing Info: Adequate Speech Info: Adequate    SPECIAL CARE FACTORS FREQUENCY                       Contractures Contractures Info: Not present    Additional Factors Info  Code Status, Allergies Code Status Info: Prior DNR on 11/26/21 Allergies Info: Penicillin G Sodium, Penicillins           Current Medications (12/03/2021):  This is the current hospital active medication list Current Facility-Administered Medications  Medication Dose Route Frequency Provider Last Rate Last Admin   amiodarone (PACERONE) tablet 200 mg  200 mg Oral Daily Trifan, Carola Rhine, MD       atorvastatin (LIPITOR) tablet 20 mg  20 mg Oral Daily Trifan, Carola Rhine, MD       carvedilol (COREG) tablet 12.5 mg  12.5 mg Oral Daily Trifan, Carola Rhine, MD       escitalopram (LEXAPRO) tablet 10 mg  10  mg Oral Daily Trifan, Carola Rhine, MD       ferrous sulfate tablet 324 mg  324 mg Oral q morning Trifan, Carola Rhine, MD       folic acid (FOLVITE) tablet 1 mg  1 mg Oral q morning Trifan, Carola Rhine, MD       loratadine (CLARITIN) tablet 10 mg  10 mg Oral Daily Trifan, Carola Rhine, MD       magnesium oxide (MAG-OX) tablet 400 mg  400 mg Oral Daily Trifan, Carola Rhine, MD       pantoprazole (PROTONIX) EC tablet 40 mg  40 mg Oral q morning Trifan, Carola Rhine, MD       sodium bicarbonate tablet 650 mg  650 mg Oral Daily Trifan, Carola Rhine, MD       tamsulosin (FLOMAX) capsule 0.4 mg  0.4 mg Oral QHS Wyvonnia Dusky, MD       torsemide [facility]) tablet 60 mg  60 mg Oral Daily Wyvonnia Dusky, MD       traZODone (DESYREL) tablet 50 mg  50 mg Oral QHS Wyvonnia Dusky, MD  Current Outpatient Medications  Medication Sig Dispense Refill   amiodarone (PACERONE) 200 MG tablet TAKE 1 TABLET BY MOUTH ONCE DAILY (Patient taking differently: Take 200 mg by mouth every morning.) 90 tablet 2   atorvastatin (LIPITOR) 20 MG tablet Take 1 tablet (20 mg total) by mouth daily. 30 tablet 0   carvedilol (COREG) 6.25 MG tablet Take 1 tablet (6.25 mg total) by mouth 2 (two) times daily. (Patient taking differently: Take 12.5 mg by mouth daily.) 180 tablet 3   cetirizine (ZYRTEC) 10 MG tablet Take 10 mg by mouth daily.     escitalopram (LEXAPRO) 10 MG tablet Take 1 tablet (10 mg total) by mouth daily. (Patient taking differently: Take 10 mg by mouth every morning.) 30 tablet 0   ferrous sulfate 324 MG TBEC Take 324 mg by mouth every morning.     FOLIC ACID PO Take 1 tablet by mouth every morning.     Magnesium Oxide 400 MG CAPS Take 1 capsule (400 mg total) by mouth daily.     Multiple Vitamin (MULTIVITAMIN PO) Take 1 tablet by mouth every other day.     nitroGLYCERIN (NITROSTAT) 0.4 MG SL tablet Place 1 tablet (0.4 mg total) under the tongue every 5 (five) minutes x 3 doses as needed for chest pain. 30 tablet 12    pantoprazole (PROTONIX) 40 MG tablet Take 1 tablet (40 mg total) by mouth daily. (Patient taking differently: Take 40 mg by mouth every morning.) 30 tablet 0   sodium bicarbonate 650 MG tablet Take 650 mg by mouth daily.     tamsulosin (FLOMAX) 0.4 MG CAPS capsule Take 0.4 mg by mouth at bedtime.     torsemide (DEMADEX) 20 MG tablet Take 60 mg by mouth daily.     traZODone (DESYREL) 50 MG tablet Take 1 tablet (50 mg total) by mouth at bedtime. 30 tablet 0     Discharge Medications: Please see discharge summary for a list of discharge medications.  Relevant Imaging Results:  Relevant Lab Results:   Additional Information SSN: [id number]. Covid vaccinated x3. Requires dialysis outpatient transport at [facility] on MWF with 11:50am arrival time.  [name], LCSWA

## 2021-12-03 NOTE — Progress Notes (Signed)
Contacted by ED CSW that pt is in need of snf at d/c. SNF bed search in progress. Pt receives out-pt HD at [facility] on MWF. Pt arrives at 11:25 for 11:45 chair time. Clinic advised pt currently in the ED awaiting snf placement. Will assist as needed.   Melven Sartorius Renal Navigator [phone]

## 2021-12-03 NOTE — ED Provider Notes (Signed)
Pt here needing dialysis Also reporting too weak to live safely at home  Physical Exam  BP 122/68   Pulse 62   Temp 97.6 F (36.4 C) (Oral)   Resp 17   Ht 6' (1.829 m)   Wt 84.7 kg   SpO2 94%   BMI 25.33 kg/m   Physical Exam  Procedures  Procedures  ED Course / MDM    Medical Decision Making Amount and/or Complexity of Data Reviewed Labs: ordered. Radiology: ordered.   UA showing no evidence of infection  TOC/SW consulted and will assist with possible placement  PT also consulted  Nephrology consulted to add him to schedule for dialysis, as the patient may be boarding in the ER hospital for several days.  No emergent indication for dialysis but today was a schedule day.       Wyvonnia Dusky, MD 12/03/21 1009

## 2021-12-03 NOTE — ED Notes (Signed)
Spoke to [facility] in [place] regarding consult. Consult pending at this time.

## 2021-12-03 NOTE — Progress Notes (Signed)
CSW spoke with [facility] in admissions at [facility]. Patient discharged from the medical floor on 09/30/21 to [facility]. Patient was there until 10/18/21 and taken home by a family member. Patient was setup with home health services. Patient has medicare A/B that requires a 3 night qualifying stay on the medical floor. CSW asked Lexine Baton if Medicare would honor is admission last week on the medical floor. Lexine Baton stated they should because its been within 30 days of his last admission.

## 2021-12-03 NOTE — Progress Notes (Signed)
Pending PT consult

## 2021-12-03 NOTE — Progress Notes (Signed)
Darryl Diaz with admissions at [facility] contacted [facility] and stated they can now  provide transportation for patient to dialysis. Patient agrees with plan. Darryl Diaz stated they cannot take patient until Monday.    [facility] stated they could take patient for SNF but he would have to change his dialysis center

## 2021-12-03 NOTE — Progress Notes (Signed)
Asked to provide HD for this esrd pt who will be boarding in the ED while awaiting SNF placement.  Pt seen in ED, he is alert, Ox3, no resp issues and no sig vol overload on exam. Labs reviewed.  No urgent HD need. Will write orders for HD today, but may be tonight  or tomorrow. If pt admitted will do formal consult.   Home meds: amiodarone, atorvastatin, carvedilol 6.25 bid, escitalopram, MVI, sl NTG, pantoprazole, sodium bicarbonate, tamsulosin, torsemide 60 qd, trazodone, prns/ vits/ supps   OP HD: [facility] MWF 4h  350/ 1.5   2/2 bath LUA AVF   Hep none   Kelly Splinter, MD 12/03/2021, 2:04 PM

## 2021-12-03 NOTE — Evaluation (Signed)
Physical Therapy Evaluation Patient Details Name: Darryl Diaz MRN: [id number] DOB: August 25, 1940 Today's Date: 12/03/2021  History of Present Illness  Pt is an 81 y/o male admitted secondary to generalized fatigue. CT of the head was unremarkable for acute findings.  Chest x-ray showed mild cardiomegaly with stable bibasilar atelectasis. Further work-up pending. PMH including but not limited to a-fib, CHF, HTN, ESRD on HD, MDD and alcohol abuse.  Clinical Impression  Pt presented supine in bed with HOB elevated, awake and willing to participate in therapy session. Prior to admission, pt reported that he ambulates with use of a RW and in independent with ADLs. Pt lives alone in a single level townhouse with a level entry. At the time of evaluation, pt able to complete bed mobility with supervision, transfers with min guard and ambulated a short distance with use of RW and min guard to min A from PT for stability. Of note, pt with recent admission (7/18) due to generalized weakness and found to have severe sepsis secondary to a UTI. Pt would continue to benefit from skilled physical therapy services at this time while admitted and after d/c to address the below listed limitations in order to improve overall safety and independence with functional mobility.      Recommendations for follow up therapy are one component of a multi-disciplinary discharge planning process, led by the attending physician.  Recommendations may be updated based on patient status, additional functional criteria and insurance authorization.  Follow Up Recommendations Skilled nursing-short term rehab (<3 hours/day) (if pt refuses, will need HHPT and 24/7 supervision/assistance) Can patient physically be transported by private vehicle: Yes    Assistance Recommended at Discharge Frequent or constant Supervision/Assistance  Patient can return home with the following  A little help with walking and/or transfers;A little help with  bathing/dressing/bathroom;Assistance with cooking/housework;Assist for transportation    Equipment Recommendations None recommended by PT  Recommendations for Other Services       Functional Status Assessment Patient has had a recent decline in their functional status and demonstrates the ability to make significant improvements in function in a reasonable and predictable amount of time.     Precautions / Restrictions Precautions Precautions: Fall Restrictions Weight Bearing Restrictions: No      Mobility  Bed Mobility Overal bed mobility: Needs Assistance Bed Mobility: Supine to Sit, Sit to Supine     Supine to sit: Supervision, HOB elevated Sit to supine: Supervision   General bed mobility comments: Supervision for safety. Use of bed rails    Transfers Overall transfer level: Needs assistance Equipment used: Rolling walker (2 wheels) Transfers: Sit to/from Stand Sit to Stand: Min guard           General transfer comment: for safety, no physical assistance required    Ambulation/Gait Ambulation/Gait assistance: Min assist, Min guard Gait Distance (Feet): 30 Feet (15' x2) Assistive device: Rolling walker (2 wheels) Gait Pattern/deviations: Step-through pattern, Decreased stride length Gait velocity: Decreased     General Gait Details: moderate instability with use of RW and required cueing to maintain proximity to [employer]    Modified Rankin (Stroke Patients Only)       Balance Overall balance assessment: Needs assistance Sitting-balance support: Feet supported Sitting balance-Leahy Scale: Fair     Standing balance support: During functional activity, Single extremity supported, Bilateral upper extremity supported Standing balance-Leahy Scale: Poor  Pertinent Vitals/Pain Pain Assessment Pain Assessment: No/denies pain    Home Living Family/patient expects to be  discharged to:: Private residence Living Arrangements: Alone Available Help at Discharge: Friend(s);Available PRN/intermittently Type of Home: Other(Comment) (townhome) Home Access: Level entry       Home Layout: One level Home Equipment: Conservation officer, nature (2 wheels);Rollator (4 wheels);Shower seat;Grab bars - tub/shower Additional Comments: Pt reports they have an aide come in about 3 hours to assist for gathering household items and assist as needed but not they call as needed. Pt reports they were still using as they were still unpacking house.    Prior Function Prior Level of Function : History of Falls (last six months);Needs assist             Mobility Comments: Uses rollator for mobility ADLs Comments: [employer] assists with transportation to store. Has transportation to medical appointments and dialysis. Independent with ADLS does his own cooking     Hand Dominance   Dominant Hand: Right    Extremity/Trunk Assessment   Upper Extremity Assessment Upper Extremity Assessment: Generalized weakness    Lower Extremity Assessment Lower Extremity Assessment: Generalized weakness       Communication   Communication: No difficulties  Cognition Arousal/Alertness: Awake/alert Behavior During Therapy: Impulsive Overall Cognitive Status: History of cognitive impairments - at baseline                                          General Comments      Exercises     Assessment/Plan    PT Assessment Patient needs continued PT services  PT Problem List Decreased strength;Decreased activity tolerance;Decreased balance;Decreased mobility;Decreased knowledge of use of DME;Decreased knowledge of precautions;Decreased safety awareness;Decreased cognition       PT Treatment Interventions DME instruction;Gait training;Stair training;Therapeutic activities;Functional mobility training;Therapeutic exercise;Balance training;Patient/family education    PT Goals  (Current goals can be found in the Care Plan section)  Acute Rehab PT Goals Patient Stated Goal: to go home PT Goal Formulation: With patient Time For Goal Achievement: 12/17/21 Potential to Achieve Goals: Good    Frequency Min 3X/week     Co-evaluation               AM-PAC PT "6 Clicks" Mobility  Outcome Measure Help needed turning from your back to your side while in a flat bed without using bedrails?: None Help needed moving from lying on your back to sitting on the side of a flat bed without using bedrails?: A Little Help needed moving to and from a bed to a chair (including a wheelchair)?: A Little Help needed standing up from a chair using your arms (e.g., wheelchair or bedside chair)?: A Little Help needed to walk in hospital room?: A Little Help needed climbing 3-5 steps with a railing? : A Lot 6 Click Score: 18    End of Session Equipment Utilized During Treatment: Gait belt Activity Tolerance: Patient limited by fatigue Patient left: in bed;with call bell/phone within reach;Other (comment) (on stretcher in ED hallway) Nurse Communication: Mobility status PT Visit Diagnosis: Unsteadiness on feet (R26.81);Muscle weakness (generalized) (M62.81);History of falling (Z91.81)    Time: 8299-3716 PT Time Calculation (min) (ACUTE ONLY): 15 min   Charges:   PT Evaluation $PT Eval Moderate Complexity: 1 Mod          Eduard Clos, PT, DPT  Acute Rehabilitation Services Office [phone]  Clearnce Sorrel Yahaira Bruski 12/03/2021, 11:18 AM

## 2021-12-03 NOTE — Progress Notes (Signed)
CSW spoke with patient to let him know [facility] cannot take him back. CSW told patient that [facility] and [facility] has offered him a bed. Patient stated [facility] was fine. CSW contacted Lorenza Chick in admissions at [facility] who stated they can take patient Monday.   CSW received a call from [facility] who stated she checked with her transportation team and they cannot take him dialysis.     CSW contacted the following facilities   [facility]- No transportation to dialysis  [facility]- Has a hold on medicare admits  [facility] message to [facility] in admissions  [facility] message to [facility] in admissions  [facility] message to [facility] in admissions  [facility] [place] on vacation please call [phone] and ask for [facility]. Ms. Jerline Pain currently reviewing patient.  [facility]- Reviewing patient  [facility]- Sent message to Maudie Mercury in admissions  [facility] ([place])- No male beds today

## 2021-12-04 DIAGNOSIS — R531 Weakness: Secondary | ICD-10-CM | POA: Diagnosis not present

## 2021-12-04 LAB — URINE CULTURE

## 2021-12-04 MED ORDER — SODIUM CHLORIDE 0.9 % IV BOLUS
250.0000 mL | INTRAVENOUS | Status: DC | PRN
  Administered 2021-12-04: 250 mL via INTRAVENOUS

## 2021-12-04 NOTE — Progress Notes (Addendum)
Received patient in bed, alert and oriented. Informed consent signed and in chart.  Tx duration: 3.5  HD treatment completed. Patient tolerated well. Fistula without signs and symptoms of complications. Patient transported back to the room, alert and orient and in no acute distress. Report given to bedside RN.  Total UF removed:2000  Medication given:  Post HD VS:113/54,99%,56,17,98.0  Post HD weight: 79.2kg

## 2021-12-04 NOTE — ED Notes (Signed)
Pt's B/P noted to be hypotensive. Pt remains at baseline, is alert and eating pudding. Denies CP, ShOB. 2+ peripheral pulses noted.

## 2021-12-04 NOTE — ED Notes (Signed)
Blankets and sheets changed, depended changed, and gown changed. Patient placed back in bed.

## 2021-12-04 NOTE — ED Notes (Signed)
Pt unintentionally pulled his IV out at this time.

## 2021-12-04 NOTE — Procedures (Addendum)
Pt seen on HD this am, he is off schedule due to census/ staffing. Next HD if still here will by this Monday.  Pt remains "boarding" in ED. Pt says he has a SNF to go to on Monday, not sure of his accuracy though.   OP HD: [facility] MWF 4h  350/ 1.5  84kg   2/2 bath LUA AVF   Hep none  Kelly Splinter, MD 12/04/2021, 12:06 PM

## 2021-12-04 NOTE — ED Notes (Signed)
Per EDP holding on home meds at this time.

## 2021-12-04 NOTE — ED Notes (Signed)
Pt transported to dialysis treatment via stretcher.

## 2021-12-05 ENCOUNTER — Other Ambulatory Visit: Payer: Self-pay

## 2021-12-05 ENCOUNTER — Encounter (HOSPITAL_COMMUNITY): Payer: Self-pay

## 2021-12-05 DIAGNOSIS — R531 Weakness: Secondary | ICD-10-CM | POA: Diagnosis not present

## 2021-12-05 LAB — CULTURE, BLOOD (ROUTINE X 2)
Culture: NO GROWTH
Culture: NO GROWTH
Special Requests: ADEQUATE
Special Requests: ADEQUATE

## 2021-12-05 MED ORDER — CHLORHEXIDINE GLUCONATE CLOTH 2 % EX PADS
6.0000 | MEDICATED_PAD | Freq: Every day | CUTANEOUS | Status: DC
Start: 2021-12-05 — End: 2021-12-07

## 2021-12-05 NOTE — Progress Notes (Signed)
Pt seen in ED. Still boarding in ED awaiting SNF placement. Looks like has been accepted at [facility] on Monday w/ transportation to HD provided. Has hypotension post HD yesterday and post wt's were well below edw. Got NS bolus and feels okay today. Will get orthostatic BP and give NS if needed (5kg under post HD yest). Coreg is on hold for now. BP's okay. HD tomorrow, keep even most likely pending wts.   Home meds: amiodarone, atorvastatin, carvedilol 6.25 bid, escitalopram, MVI, sl NTG, pantoprazole, sodium bicarbonate, tamsulosin, torsemide 60 qd, trazodone, prns/ vits/ supps   OP HD: [facility] MWF 4h  350/ 1.5  84kg   2/2 bath LUA AVF   Hep none   Kelly Splinter, MD 12/05/2021, 8:12 AM

## 2021-12-05 NOTE — ED Notes (Signed)
Patient found attempting to get out of bed. Patient states "I'm going to take a walk around my house. I just need my shoes." RN explained to patient it is not safe to get out of bed without assistance and due to patient status being unsteady and weak on foot.   Patient assisted back into bed after linen changed, and brief changed. Provided patient with snacks and a call button at bedside. RN informed patient to use call button if assistance is needed.

## 2021-12-05 NOTE — ED Provider Notes (Signed)
Emergency Medicine Observation Re-evaluation Note  Darryl Diaz is a 81 y.o. male, seen on rounds today.  Pt initially presented to the ED for complaints of Medical Clearance Currently, the patient is asleep.  Pt was admitted from 7/18-21 for weakness, sepsis from a UTI, and chronic anemia.  I saw him on 7/25.  He was hypotensive after dialysis, but was feeling better in the ED after fluids.  He was d/c and came back on 7/27.  He was too weak to go home, so a TOC consult was placed for SNF placement.  Per SW notes, he has a bed at [facility], but not until tomorrow.  Pt normally gets dialysis MWF, but did not receive dialysis on Friday while here due to staffing issues.  He did get it yesterday.  He did get hypotensive after dialysis and required a NS bolus.  Dr. Jonnie Finner (nephrology) will arrange for dialysis tomorrow, if he's still here, to get him back on his normal schedule.   Physical Exam  BP 128/60   Pulse 64   Temp 98.6 F (37 C) (Oral)   Resp 16   Ht 6' (1.829 m)   Wt 79.2 kg   SpO2 95%   BMI 23.68 kg/m  Physical Exam General: asleep Cardiac: rr Lungs: clear Psych: calm  ED Course / MDM  EKG:   I have reviewed the labs performed to date as well as medications administered while in observation.  Recent changes in the last 24 hours include dialysis yesterday.  Plan  Current plan is for SNF placement.  LAVANCE BEAZER is not under involuntary commitment.     Darryl Pence, MD 12/05/21 1217

## 2021-12-06 ENCOUNTER — Encounter (HOSPITAL_COMMUNITY): Payer: Self-pay

## 2021-12-06 DIAGNOSIS — Z7401 Bed confinement status: Secondary | ICD-10-CM | POA: Diagnosis not present

## 2021-12-06 DIAGNOSIS — R404 Transient alteration of awareness: Secondary | ICD-10-CM | POA: Diagnosis not present

## 2021-12-06 DIAGNOSIS — R531 Weakness: Secondary | ICD-10-CM | POA: Diagnosis not present

## 2021-12-06 NOTE — Procedures (Signed)
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  BFR 350  Via AVF BP is  164/63.   Patient appears to be tolerating treatment well. He is under ER care and plan to discharge to SNF noted.    OP HD: [facility] MWF 4h  350/ 1.5  84kg   2/2 bath LUA AVF   Hep none.  Kerrin Markman Tanna Furry 12/06/2021

## 2021-12-06 NOTE — ED Notes (Signed)
Patient transported to Dialysis

## 2021-12-06 NOTE — Progress Notes (Signed)
Physical Therapy Treatment Patient Details Name: Darryl Diaz MRN: [id number] DOB: 02-21-1941 Today's Date: 12/06/2021   History of Present Illness Pt is an 81 y/o male admitted secondary to generalized fatigue. CT of the head was unremarkable for acute findings.  Chest x-ray showed mild cardiomegaly with stable bibasilar atelectasis. Further work-up pending. PMH including but not limited to a-fib, CHF, HTN, ESRD on HD, MDD and alcohol abuse.    PT Comments    Pt progressing towards goals. Pt with increased fatigue following dialysis. Was able to perform transfers and side steps with min to min guard A using RW. Reviewed supine and seated HEP. Current recommendations for SNF appropriate. Will continue to follow acutely.     Recommendations for follow up therapy are one component of a multi-disciplinary discharge planning process, led by the attending physician.  Recommendations may be updated based on patient status, additional functional criteria and insurance authorization.  Follow Up Recommendations  Skilled nursing-short term rehab (<3 hours/day) Can patient physically be transported by private vehicle: Yes   Assistance Recommended at Discharge Frequent or constant Supervision/Assistance  Patient can return home with the following A little help with walking and/or transfers;A little help with bathing/dressing/bathroom;Assistance with cooking/housework;Assist for transportation   Equipment Recommendations  None recommended by PT    Recommendations for Other Services       Precautions / Restrictions Precautions Precautions: Fall Restrictions Weight Bearing Restrictions: No     Mobility  Bed Mobility Overal bed mobility: Needs Assistance Bed Mobility: Supine to Sit, Sit to Supine     Supine to sit: Supervision, HOB elevated Sit to supine: Supervision   General bed mobility comments: Supervision for safety. Use of bed rails    Transfers Overall transfer level: Needs  assistance Equipment used: Rolling walker (2 wheels) Transfers: Sit to/from Stand Sit to Stand: Min guard           General transfer comment: Min guard for safety. Required manual blocking of feet as they tended to slide forwards.    Ambulation/Gait Ambulation/Gait assistance: Min assist, Min guard   Assistive device: Rolling walker (2 wheels)   Gait velocity: Decreased     General Gait Details: took side steps at EOB for repositioning in bed. Min guard to min A for steadying. Pt's lunch arrived and pt wanting to eat, so further mobility deferred.   Stairs             Wheelchair Mobility    Modified Rankin (Stroke Patients Only)       Balance Overall balance assessment: Needs assistance Sitting-balance support: Feet supported Sitting balance-Leahy Scale: Fair     Standing balance support: During functional activity, Single extremity supported, Bilateral upper extremity supported Standing balance-Leahy Scale: Poor                              Cognition Arousal/Alertness: Awake/alert Behavior During Therapy: Impulsive Overall Cognitive Status: History of cognitive impairments - at baseline                                          Exercises General Exercises - Lower Extremity Ankle Circles/Pumps: AROM, Both, 10 reps Long Arc Quad: AROM, Both, 10 reps, Seated Straight Leg Raises: AROM, Both, 10 reps    General Comments        Pertinent Vitals/Pain  Home Living                          Prior Function            PT Goals (current goals can now be found in the care plan section) Acute Rehab PT Goals Patient Stated Goal: to go home PT Goal Formulation: With patient Time For Goal Achievement: 12/17/21 Potential to Achieve Goals: Good Progress towards PT goals: Progressing toward goals    Frequency    Min 2X/week      PT Plan Frequency needs to be updated    Co-evaluation               AM-PAC PT "6 Clicks" Mobility   Outcome Measure  Help needed turning from your back to your side while in a flat bed without using bedrails?: None Help needed moving from lying on your back to sitting on the side of a flat bed without using bedrails?: A Little Help needed moving to and from a bed to a chair (including a wheelchair)?: A Little Help needed standing up from a chair using your arms (e.g., wheelchair or bedside chair)?: A Little Help needed to walk in hospital room?: A Little Help needed climbing 3-5 steps with a railing? : A Lot 6 Click Score: 18    End of Session Equipment Utilized During Treatment: Gait belt Activity Tolerance: Patient tolerated treatment well Patient left: in bed;with call bell/phone within reach;Other (comment) (on stretcher in ED) Nurse Communication: Mobility status PT Visit Diagnosis: Unsteadiness on feet (R26.81);Muscle weakness (generalized) (M62.81);History of falling (Z91.81)     Time: 2035-5974 PT Time Calculation (min) (ACUTE ONLY): 14 min  Charges:  $Therapeutic Activity: 8-22 mins                     Lou Miner, DPT  Acute Rehabilitation Services  Office: [phone]    Rudean Hitt 12/06/2021, 2:18 PM

## 2021-12-06 NOTE — ED Notes (Signed)
Attempted x3 to call report to facility, no answer.

## 2021-12-06 NOTE — ED Notes (Signed)
Attempted to call report to facility x1, no answer.

## 2021-12-06 NOTE — Progress Notes (Signed)
Patient will discharge to [facility] today. Patient is going to room 124B, number for report is [phone]. Facility can accept patient between 3-4 PM today.

## 2021-12-06 NOTE — ED Notes (Signed)
Attempted to call report to facility x2, no answer at extension

## 2021-12-06 NOTE — Progress Notes (Signed)
PT Cancellation Note  Patient Details Name: EVART MCDONNELL MRN: [id number] DOB: 03/19/1941   Cancelled Treatment:    Reason Eval/Treat Not Completed: Patient at procedure or test/unavailable Pt currently at HD. Will follow up as schedule allows.   Lou Miner, DPT  Acute Rehabilitation Services  Office: [phone]    Rudean Hitt 12/06/2021, 8:47 AM

## 2021-12-06 NOTE — ED Notes (Signed)
Patient appears to be resting comfortably at this time. Call bell is in reach and patient doesn't verbalize any complaints at this time

## 2021-12-06 NOTE — ED Provider Notes (Signed)
Emergency Medicine Observation Re-evaluation Note  Darryl Diaz is a 81 y.o. male, seen on rounds today.  Pt initially presented to the ED for complaints of Medical Clearance Currently, the patient is resting.  Pt was admitted from 7/18-21 for weakness, sepsis from a UTI, and chronic anemia.  The patient was seen in the ED on 7/25. He was hypotensive after dialysis, but was feeling better in the ED after fluids.  He was d/c and came back on 7/27.  He was too weak to go home, so a TOC consult was placed for SNF placement.  Per SW notes, he has a bed at [facility], but not until tomorrow.  Pt normally gets dialysis MWF, but did not receive dialysis on Friday while here due to staffing issues.  He did get it Saturday.  He did get hypotensive after dialysis and required a NS bolus.  Dr. Jonnie Finner (nephrology) arranged for dialysis today, if he's still here, to get him back on his normal schedule.   Physical Exam  BP 136/61 (BP Location: Right Arm)   Pulse 66   Temp (!) 97.2 F (36.2 C)   Resp 19   Ht 6' (1.829 m)   Wt 81.5 kg   SpO2 100%   BMI 24.37 kg/m  Physical Exam General: asleep Cardiac: Well perfused Lungs: Even and unlabored Psych: calm  ED Course / MDM  EKG:   I have reviewed the labs performed to date as well as medications administered while in observation.  Recent changes in the last 24 hours include dialysis scheduled for today.  Plan  Current plan is for SNF placement.  Darryl Diaz is not under involuntary commitment.       Regan Lemming, MD 12/06/21 1425

## 2021-12-07 DIAGNOSIS — M6281 Muscle weakness (generalized): Secondary | ICD-10-CM | POA: Diagnosis not present

## 2021-12-07 DIAGNOSIS — N401 Enlarged prostate with lower urinary tract symptoms: Secondary | ICD-10-CM | POA: Diagnosis not present

## 2021-12-07 DIAGNOSIS — N186 End stage renal disease: Secondary | ICD-10-CM | POA: Diagnosis not present

## 2021-12-07 DIAGNOSIS — I10 Essential (primary) hypertension: Secondary | ICD-10-CM | POA: Diagnosis not present

## 2021-12-07 DIAGNOSIS — I129 Hypertensive chronic kidney disease with stage 1 through stage 4 chronic kidney disease, or unspecified chronic kidney disease: Secondary | ICD-10-CM | POA: Diagnosis not present

## 2021-12-07 DIAGNOSIS — F339 Major depressive disorder, recurrent, unspecified: Secondary | ICD-10-CM | POA: Diagnosis not present

## 2021-12-07 DIAGNOSIS — Z992 Dependence on renal dialysis: Secondary | ICD-10-CM | POA: Diagnosis not present

## 2021-12-07 DIAGNOSIS — K219 Gastro-esophageal reflux disease without esophagitis: Secondary | ICD-10-CM | POA: Diagnosis not present

## 2021-12-07 DIAGNOSIS — I5042 Chronic combined systolic (congestive) and diastolic (congestive) heart failure: Secondary | ICD-10-CM | POA: Diagnosis not present

## 2021-12-07 DIAGNOSIS — E785 Hyperlipidemia, unspecified: Secondary | ICD-10-CM | POA: Diagnosis not present

## 2021-12-07 DIAGNOSIS — I4891 Unspecified atrial fibrillation: Secondary | ICD-10-CM | POA: Diagnosis not present

## 2021-12-07 DIAGNOSIS — G4700 Insomnia, unspecified: Secondary | ICD-10-CM | POA: Diagnosis not present

## 2021-12-07 DIAGNOSIS — D649 Anemia, unspecified: Secondary | ICD-10-CM | POA: Diagnosis not present

## 2021-12-07 NOTE — Progress Notes (Signed)
Late entry note:  Per chart review, pt was d/c to [facility] snf yesterday. Contacted [place] SW this am to advise clinic of pt's d/c and where pt was d/c to. Pt should resume care at clinic tomorrow.   Melven Sartorius Renal Navigator [phone]

## 2021-12-08 ENCOUNTER — Other Ambulatory Visit: Payer: Self-pay | Admitting: *Deleted

## 2021-12-08 DIAGNOSIS — Z992 Dependence on renal dialysis: Secondary | ICD-10-CM | POA: Diagnosis not present

## 2021-12-08 DIAGNOSIS — N2581 Secondary hyperparathyroidism of renal origin: Secondary | ICD-10-CM | POA: Diagnosis not present

## 2021-12-08 DIAGNOSIS — D509 Iron deficiency anemia, unspecified: Secondary | ICD-10-CM | POA: Diagnosis not present

## 2021-12-08 DIAGNOSIS — D631 Anemia in chronic kidney disease: Secondary | ICD-10-CM | POA: Diagnosis not present

## 2021-12-08 DIAGNOSIS — N186 End stage renal disease: Secondary | ICD-10-CM | POA: Diagnosis not present

## 2021-12-08 NOTE — Patient Outreach (Signed)
THN Post- Acute Care Coordinator follow up. Per [facility] THN eligible member resides in  ***.  Screened for potential THN Care Management services/ THN care coordination services as a benefit of member's insurance plan.  ***Member's PCP at *** has [facility] care management services available. ***   **** admitted to SNF on **** after hospitalization****.  Facility site visit to **** skilled nursing facility. Met with **** concerning member's progress, transition plan, and potential THN needs. Anticipated transition plan is ***   Spoke with ***  in room at *** SNF to discuss transition plans and TH[facility] Care Management services/ THN follow up.  Confirmed PCP is Dr. *** with *** Confirmed best contact number for *** is ***  Member's PCP at *** has [facility] Embedded care coordination team available if needed. CCM services would need to be ordered by PCP.  Provided THN Care Management brochure, 24-hr nurse advice line magnet, and writer's contact information.   Will continue to collaborate with SNF SW and follow transition plans/needs while member resides in SNF.        

## 2021-12-09 DIAGNOSIS — I10 Essential (primary) hypertension: Secondary | ICD-10-CM | POA: Diagnosis not present

## 2021-12-09 DIAGNOSIS — I5040 Unspecified combined systolic (congestive) and diastolic (congestive) heart failure: Secondary | ICD-10-CM | POA: Diagnosis not present

## 2021-12-09 DIAGNOSIS — L89152 Pressure ulcer of sacral region, stage 2: Secondary | ICD-10-CM | POA: Diagnosis not present

## 2021-12-09 DIAGNOSIS — D649 Anemia, unspecified: Secondary | ICD-10-CM | POA: Diagnosis not present

## 2021-12-10 DIAGNOSIS — I5042 Chronic combined systolic (congestive) and diastolic (congestive) heart failure: Secondary | ICD-10-CM | POA: Diagnosis not present

## 2021-12-10 DIAGNOSIS — N2581 Secondary hyperparathyroidism of renal origin: Secondary | ICD-10-CM | POA: Diagnosis not present

## 2021-12-10 DIAGNOSIS — D649 Anemia, unspecified: Secondary | ICD-10-CM | POA: Diagnosis not present

## 2021-12-10 DIAGNOSIS — F339 Major depressive disorder, recurrent, unspecified: Secondary | ICD-10-CM | POA: Diagnosis not present

## 2021-12-10 DIAGNOSIS — D631 Anemia in chronic kidney disease: Secondary | ICD-10-CM | POA: Diagnosis not present

## 2021-12-10 DIAGNOSIS — Z992 Dependence on renal dialysis: Secondary | ICD-10-CM | POA: Diagnosis not present

## 2021-12-10 DIAGNOSIS — I10 Essential (primary) hypertension: Secondary | ICD-10-CM | POA: Diagnosis not present

## 2021-12-10 DIAGNOSIS — K219 Gastro-esophageal reflux disease without esophagitis: Secondary | ICD-10-CM | POA: Diagnosis not present

## 2021-12-10 DIAGNOSIS — D509 Iron deficiency anemia, unspecified: Secondary | ICD-10-CM | POA: Diagnosis not present

## 2021-12-10 DIAGNOSIS — M6281 Muscle weakness (generalized): Secondary | ICD-10-CM | POA: Diagnosis not present

## 2021-12-10 DIAGNOSIS — I4891 Unspecified atrial fibrillation: Secondary | ICD-10-CM | POA: Diagnosis not present

## 2021-12-10 DIAGNOSIS — N401 Enlarged prostate with lower urinary tract symptoms: Secondary | ICD-10-CM | POA: Diagnosis not present

## 2021-12-10 DIAGNOSIS — G4700 Insomnia, unspecified: Secondary | ICD-10-CM | POA: Diagnosis not present

## 2021-12-10 DIAGNOSIS — E785 Hyperlipidemia, unspecified: Secondary | ICD-10-CM | POA: Diagnosis not present

## 2021-12-10 DIAGNOSIS — N186 End stage renal disease: Secondary | ICD-10-CM | POA: Diagnosis not present

## 2021-12-13 DIAGNOSIS — D509 Iron deficiency anemia, unspecified: Secondary | ICD-10-CM | POA: Diagnosis not present

## 2021-12-13 DIAGNOSIS — D631 Anemia in chronic kidney disease: Secondary | ICD-10-CM | POA: Diagnosis not present

## 2021-12-13 DIAGNOSIS — N186 End stage renal disease: Secondary | ICD-10-CM | POA: Diagnosis not present

## 2021-12-13 DIAGNOSIS — Z992 Dependence on renal dialysis: Secondary | ICD-10-CM | POA: Diagnosis not present

## 2021-12-13 DIAGNOSIS — N2581 Secondary hyperparathyroidism of renal origin: Secondary | ICD-10-CM | POA: Diagnosis not present

## 2021-12-14 ENCOUNTER — Emergency Department (HOSPITAL_COMMUNITY): Payer: Medicare Other

## 2021-12-14 ENCOUNTER — Other Ambulatory Visit: Payer: Self-pay | Admitting: *Deleted

## 2021-12-14 ENCOUNTER — Emergency Department (HOSPITAL_COMMUNITY)
Admission: EM | Admit: 2021-12-14 | Discharge: 2021-12-14 | Disposition: A | Discharge status: Home / Self Care | Payer: Medicare Other | Attending: Student | Admitting: Student

## 2021-12-14 ENCOUNTER — Other Ambulatory Visit: Payer: Self-pay

## 2021-12-14 DIAGNOSIS — D72819 Decreased white blood cell count, unspecified: Secondary | ICD-10-CM | POA: Insufficient documentation

## 2021-12-14 DIAGNOSIS — R000 Tachycardia, unspecified: Secondary | ICD-10-CM | POA: Diagnosis not present

## 2021-12-14 DIAGNOSIS — N186 End stage renal disease: Secondary | ICD-10-CM

## 2021-12-14 DIAGNOSIS — W19XXXA Unspecified fall, initial encounter: Secondary | ICD-10-CM | POA: Diagnosis not present

## 2021-12-14 DIAGNOSIS — R55 Syncope and collapse: Secondary | ICD-10-CM | POA: Insufficient documentation

## 2021-12-14 DIAGNOSIS — I2510 Atherosclerotic heart disease of native coronary artery without angina pectoris: Secondary | ICD-10-CM | POA: Diagnosis not present

## 2021-12-14 DIAGNOSIS — E785 Hyperlipidemia, unspecified: Secondary | ICD-10-CM | POA: Diagnosis not present

## 2021-12-14 DIAGNOSIS — Z743 Need for continuous supervision: Secondary | ICD-10-CM | POA: Diagnosis not present

## 2021-12-14 DIAGNOSIS — I4891 Unspecified atrial fibrillation: Secondary | ICD-10-CM | POA: Diagnosis not present

## 2021-12-14 DIAGNOSIS — N401 Enlarged prostate with lower urinary tract symptoms: Secondary | ICD-10-CM | POA: Diagnosis not present

## 2021-12-14 DIAGNOSIS — Z87891 Personal history of nicotine dependence: Secondary | ICD-10-CM | POA: Insufficient documentation

## 2021-12-14 DIAGNOSIS — E876 Hypokalemia: Secondary | ICD-10-CM | POA: Diagnosis not present

## 2021-12-14 DIAGNOSIS — R42 Dizziness and giddiness: Secondary | ICD-10-CM | POA: Diagnosis not present

## 2021-12-14 DIAGNOSIS — D649 Anemia, unspecified: Secondary | ICD-10-CM | POA: Diagnosis not present

## 2021-12-14 DIAGNOSIS — I5042 Chronic combined systolic (congestive) and diastolic (congestive) heart failure: Secondary | ICD-10-CM | POA: Diagnosis not present

## 2021-12-14 DIAGNOSIS — I10 Essential (primary) hypertension: Secondary | ICD-10-CM | POA: Diagnosis not present

## 2021-12-14 DIAGNOSIS — I499 Cardiac arrhythmia, unspecified: Secondary | ICD-10-CM | POA: Diagnosis not present

## 2021-12-14 DIAGNOSIS — F339 Major depressive disorder, recurrent, unspecified: Secondary | ICD-10-CM | POA: Diagnosis not present

## 2021-12-14 DIAGNOSIS — I4819 Other persistent atrial fibrillation: Secondary | ICD-10-CM | POA: Diagnosis not present

## 2021-12-14 DIAGNOSIS — I509 Heart failure, unspecified: Secondary | ICD-10-CM | POA: Insufficient documentation

## 2021-12-14 DIAGNOSIS — I959 Hypotension, unspecified: Secondary | ICD-10-CM | POA: Diagnosis not present

## 2021-12-14 DIAGNOSIS — I132 Hypertensive heart and chronic kidney disease with heart failure and with stage 5 chronic kidney disease, or end stage renal disease: Secondary | ICD-10-CM | POA: Insufficient documentation

## 2021-12-14 DIAGNOSIS — K219 Gastro-esophageal reflux disease without esophagitis: Secondary | ICD-10-CM | POA: Diagnosis not present

## 2021-12-14 DIAGNOSIS — S61412A Laceration without foreign body of left hand, initial encounter: Secondary | ICD-10-CM | POA: Diagnosis not present

## 2021-12-14 DIAGNOSIS — G4700 Insomnia, unspecified: Secondary | ICD-10-CM | POA: Diagnosis not present

## 2021-12-14 DIAGNOSIS — M6281 Muscle weakness (generalized): Secondary | ICD-10-CM | POA: Diagnosis not present

## 2021-12-14 LAB — CBC WITH DIFFERENTIAL/PLATELET
Abs Immature Granulocytes: 0.02 10*3/uL (ref 0.00–0.07)
Basophils Absolute: 0 10*3/uL (ref 0.0–0.1)
Basophils Relative: 1 %
Eosinophils Absolute: 0 10*3/uL (ref 0.0–0.5)
Eosinophils Relative: 1 %
HCT: 26.6 % — ABNORMAL LOW (ref 39.0–52.0)
Hemoglobin: 8.9 g/dL — ABNORMAL LOW (ref 13.0–17.0)
Immature Granulocytes: 1 %
Lymphocytes Relative: 28 %
Lymphs Abs: 1.1 10*3/uL (ref 0.7–4.0)
MCH: 35.7 pg — ABNORMAL HIGH (ref 26.0–34.0)
MCHC: 33.5 g/dL (ref 30.0–36.0)
MCV: 106.8 fL — ABNORMAL HIGH (ref 80.0–100.0)
Monocytes Absolute: 0.6 10*3/uL (ref 0.1–1.0)
Monocytes Relative: 17 %
Neutro Abs: 2 10*3/uL (ref 1.7–7.7)
Neutrophils Relative %: 52 %
Platelets: 121 10*3/uL — ABNORMAL LOW (ref 150–400)
RBC: 2.49 MIL/uL — ABNORMAL LOW (ref 4.22–5.81)
RDW: 14.9 % (ref 11.5–15.5)
WBC: 3.8 10*3/uL — ABNORMAL LOW (ref 4.0–10.5)
nRBC: 0 % (ref 0.0–0.2)

## 2021-12-14 LAB — COMPREHENSIVE METABOLIC PANEL
ALT: 14 U/L (ref 0–44)
AST: 23 U/L (ref 15–41)
Albumin: 3.1 g/dL — ABNORMAL LOW (ref 3.5–5.0)
Alkaline Phosphatase: 101 U/L (ref 38–126)
Anion gap: 14 (ref 5–15)
BUN: 29 mg/dL — ABNORMAL HIGH (ref 8–23)
CO2: 26 mmol/L (ref 22–32)
Calcium: 8 mg/dL — ABNORMAL LOW (ref 8.9–10.3)
Chloride: 98 mmol/L (ref 98–111)
Creatinine, Ser: 2.4 mg/dL — ABNORMAL HIGH (ref 0.61–1.24)
GFR, Estimated: 27 mL/min — ABNORMAL LOW (ref >60)
Glucose, Bld: 84 mg/dL (ref 70–99)
Potassium: 2.9 mmol/L — ABNORMAL LOW (ref 3.5–5.1)
Sodium: 138 mmol/L (ref 135–145)
Total Bilirubin: 0.7 mg/dL (ref 0.3–1.2)
Total Protein: 5.7 g/dL — ABNORMAL LOW (ref 6.5–8.1)

## 2021-12-14 LAB — CBG MONITORING, ED: Glucose-Capillary: 87 mg/dL (ref 70–99)

## 2021-12-14 LAB — TROPONIN I (HIGH SENSITIVITY): Troponin I (High Sensitivity): 5 ng/L (ref <18)

## 2021-12-14 NOTE — Patient Outreach (Signed)
THN Post- Acute Care Coordinator follow up.   Verified in [facility] that Mr. Gerdts discharged from [facility] on 12/13/21. Noted in Epic that Mr. Methot went to ED on 12/14/21.   Mr. Dobie was previously followed by [facility] program. He was unable to be reached. Will make referral to [facility] again for care coordination/care management services.   Noted in [facility] [place] that Mr. Dorsey has open case with [facility]. Telephone call made to [facility] to make aware Mr. Pretty has returned home from SNF and today ED.   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  Will make referral to [facility].   [name], MSN, RN,BSN [place] Acute Care Coordinator [phone] [facility]) [phone]  (Toll free office)

## 2021-12-14 NOTE — ED Provider Notes (Signed)
[facility] DEPT Provider Note  CSN: [id number] Arrival date & time: 12/14/21 0143  Chief Complaint(s) Loss of Consciousness  HPI Darryl Diaz is a 81 y.o. 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  The patient graduated from a skilled nursing facility and went home and celebrated with a glass of wine.  He states that he was able to walk to bed after that and during his sleep fell out of bed and landed on the ground.  He denies head trauma, loss of consciousness, chest pain, shortness of breath, Donnell pain, nausea, vomiting or other systemic symptoms.  He was unable to get himself off the ground and called EMS.  On arrival, patient has no complaints, including no chest pain, shortness of breath, abdominal pain, nausea, vomiting or other systemic symptoms.  States that it is not abnormal for him to be unable to get himself off the ground if he were to fall but is able to ambulate with a walker.  Past Medical History Past Medical History:  Diagnosis Date   Alcohol abuse 50/01/3817   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[place]) 03/17/2020   Alcoholism ([place])    in remission following wife's death   Anemia    Anxiety    Ascending aorta dilation (HCC)    Atrial fibrillation ([place])    Cardiac arrest ([place]) 04/20/2019   12 min CPR with epi   Chronic diastolic CHF (congestive heart failure) ([place])    Dysrhythmia    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[place])    Mon Wed Fri   Fatty liver    Gastritis    GERD (gastroesophageal reflux disease)    H/O seasonal allergies    History of blood transfusion    History of GI bleed    Hx of adenomatous colonic polyps 08/10/2017   Hyperlipidemia    Hypertension    Insomnia    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   Mallory-Weiss tear    OA  (osteoarthritis)    hands   OSA (obstructive sleep apnea)    No longer uses CPAP   Persistent atrial fibrillation with rapid ventricular response ([place]) 02/05/2020   Recurrent syncope    Restless legs syndrome (RLS)    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([place])    Patient Active Problem List   Diagnosis Date Noted   Generalized weakness 11/24/2021   Severe sepsis ([place]) 11/24/2021   Lactic acidosis 11/24/2021   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   Acute hyponatremia 09/27/2021   Incidental finding of COVID-19 virus infection 06/07/2021   Dependence on renal dialysis ([place]) 05/25/2021   Non-pressure chronic ulcer of other part of left foot with unspecified severity ([place]) 05/25/2021   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[place]) 05/25/2021   Osteomyelitis of great toe of left foot ([place])    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[place]) 04/13/2021   Cellulitis of right foot 01/17/2021   Normocytic anemia 01/17/2021   UTI (urinary tract infection) 01/17/2021   Acute GI bleeding 01/06/2021   Pancytopenia ([place]) 12/02/2020   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   Orthostatic hypotension 11/23/2020   Acute renal insufficiency 10/23/2020   Alcohol abuse 10/23/2020   Hypokalemia 10/23/2020   Hypomagnesemia 10/23/2020   Low back pain at multiple sites 10/23/2020   Malnutrition of mild degree Altamease Oiler: 75% to less than 90%  of standard weight) ([place]) 10/23/2020   Open wound of scalp without complication 07/62/2633   Oral candidiasis 10/23/2020   Cellulitis of left foot    Cellulitis of toe of left foot 10/13/2020   Skin ulcer of left great toe ([place]) 10/13/2020   CKD (chronic kidney disease) stage 4, 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/13/2020   Chronic diastolic CHF (congestive heart failure) ([place]) 10/13/2020   Transient weakness of left lower extremity 09/07/2020   Heme positive stool 05/14/2020   Abnormality of gait 04/27/2020   Salmonella food poisoning 35/45/6256   Alcoholic  cirrhosis of liver without ascites ([place]) 03/17/2020   Yeast infection 03/16/2020   Diarrhea 03/16/2020   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 (HCC)    Allergies    Anemia, chronic disease    History of hypertension    Debility    Syncope and collapse 02/22/2020   AKI (acute kidney injury) ([place]) 02/10/2020   Unspecified protein-calorie malnutrition ([place]) 02/10/2020   Weakness 02/06/2020   History of repair of hip joint 10/15/2019   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   Pain in right foot 06/27/2019   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   Bradycardia 04/20/2019   Gastrointestinal hemorrhage 02/25/2019   Fall at home, initial encounter 02/25/2019   Bilateral lower extremity edema 02/25/2019   Chronic hyponatremia 02/25/2019   Fall 02/25/2019   Benign prostatic hyperplasia with lower urinary tract symptoms 12/19/2018   Iron deficiency anemia due to chronic blood loss 08/11/2018   Acute on chronic anemia 08/11/2018   Hardening of the aorta (main artery of the heart) ([place]) 08/11/2018   Benign neoplasm of colon 06/18/2018   History of adenomatous polyp of colon 08/10/2017   Impaired fasting glucose 06/10/2016   Encounter for general adult medical examination without abnormal findings 06/03/2016   Posterior dislocation of hip, closed ([place]) 04/30/2016   Hip fracture ([place]) 03/30/2016   Hypertension 03/30/2016   Hyperlipidemia 03/30/2016   Osteoarthritis 03/30/2016   Depressive disorder 03/30/2016   Rhabdomyolysis 03/30/2016   Leukocytosis 03/30/2016   Pressure ulcer 03/30/2016   Fracture of hip ([place]) 03/30/2016   GERD (gastroesophageal reflux disease) 12/17/2015   Paroxysmal atrial fibrillation ([place]) 12/17/2015   Primary hypertension 12/17/2015   Long term (current) use of anticoagulants 12/17/2015   Moderate major depression, single episode ([place]) 12/17/2015   Pure hypercholesterolemia 12/17/2015   Seasonal allergic rhinitis 12/17/2015   CAD (coronary artery  disease) 08/21/2015   Home Medication(s) Prior to Admission medications   Medication Sig Start Date End Date Taking? Authorizing Provider  amiodarone (PACERONE) 200 MG tablet TAKE 1 TABLET BY MOUTH ONCE DAILY Patient taking differently: Take 200 mg by mouth every morning. 05/17/21  Yes Weaver, Scott T, PA-C  atorvastatin (LIPITOR) 20 MG tablet Take 1 tablet (20 mg total) by mouth daily. 03/10/20  Yes Angiulli, Lavon Paganini, PA-C  carvedilol (COREG) 6.25 MG tablet Take 1 tablet (6.25 mg total) by mouth 2 (two) times daily. Patient taking differently: Take 12.5 mg by mouth daily. 02/11/21  Yes Sherren Mocha, MD  cetirizine (ZYRTEC) 10 MG tablet Take 10 mg by mouth daily.   Yes [provider]  ferrous sulfate 324 MG TBEC Take 324 mg by mouth every morning.   Yes [provider]  FOLIC ACID PO Take 1 tablet by mouth every morning.   Yes [provider]  Magnesium Oxide 400 MG CAPS Take 1 capsule (400 mg total) by mouth daily. 10/17/20  Yes Eugenie Filler, MD  Multiple Vitamin (  MULTIVITAMIN PO) Take 1 tablet by mouth every other day.   Yes [provider]  pantoprazole (PROTONIX) 40 MG tablet Take 1 tablet (40 mg total) by mouth daily. Patient taking differently: Take 40 mg by mouth every morning. 03/10/20  Yes Angiulli, Lavon Paganini, PA-C  sodium bicarbonate 650 MG tablet Take 650 mg by mouth daily.   Yes [provider]  torsemide (DEMADEX) 20 MG tablet Take 60 mg by mouth daily.   Yes [provider]  traZODone (DESYREL) 50 MG tablet Take 1 tablet (50 mg total) by mouth at bedtime. 11/26/21  Yes Alma Friendly, MD  escitalopram (LEXAPRO) 10 MG tablet Take 1 tablet (10 mg total) by mouth daily. Patient taking differently: Take 10 mg by mouth every morning. 03/10/20   Angiulli, Lavon Paganini, PA-C  nitroGLYCERIN (NITROSTAT) 0.4 MG SL tablet Place 1 tablet (0.4 mg total) under the tongue every 5 (five) minutes x 3 doses as needed for chest pain. 07/28/20    Baldwin Jamaica, PA-C  tamsulosin (FLOMAX) 0.4 MG CAPS capsule Take 0.4 mg by mouth at bedtime.    [provider]                                                                                                                                    Past Surgical History Past Surgical History:  Procedure Laterality Date   AMPUTATION Left 05/19/2021   Procedure: LEFT GREAT TOE AMPUTATION;  Surgeon: Newt Minion, MD;  Location: [facility];  Service: Orthopedics;  Laterality: Left;   ANKLE SURGERY Left 2014   had rods put in    AV FISTULA PLACEMENT Left 04/21/2021   Procedure: LEFT ARM ARTERIOVENOUS (AV) FISTULA CREATION - Left brachiocephalic fistula;  Surgeon: Marty Heck, MD;  Location: [facility];  Service: Vascular;  Laterality: Left;   BIOPSY  02/28/2019   Procedure: BIOPSY;  Surgeon: Milus Banister, MD;  Location: [facility];  Service: Endoscopy;;   CARDIOVERSION  02/06/2020   CARDIOVERSION N/A 02/06/2020   Procedure: CARDIOVERSION;  Surgeon: Werner Lean, MD;  Location: [facility];  Service: Cardiovascular;  Laterality: N/A;   CARDIOVERSION N/A 02/24/2020   Procedure: CARDIOVERSION;  Surgeon: Werner Lean, MD;  Location: [facility];  Service: Cardiovascular;  Laterality: N/A;   COLONOSCOPY     [place]   COLONOSCOPY WITH PROPOFOL N/A 01/08/2021   Procedure: COLONOSCOPY WITH PROPOFOL;  Surgeon: Milus Banister, MD;  Location[facility] ENDOSCOPY;  Service: Endoscopy;  Laterality: N/A;   ESOPHAGOGASTRODUODENOSCOPY (EGD) WITH PROPOFOL N/A 02/28/2019   Procedure: ESOPHAGOGASTRODUODENOSCOPY (EGD) WITH PROPOFOL;  Surgeon: Milus Banister, MD;  Location[facility] END[facility]COPY;  Service: Endoscopy;  Laterality: N/A;   FRACTURE SURGERY     HIP ARTHROPLASTY Left 04/01/2016   Procedure: ARTHROPLASTY BIPOLAR HIP (HEMIARTHROPLASTY);  Surgeon: Paralee Cancel, MD;  Location: [facility];  Service: Orthopedics;  Laterality: Left;   HIP CLOSED REDUCTION Left 04/30/2016  Procedure: CLOSED REDUCTION HIP;  Surgeon: Wylene Simmer, MD;  Location: [facility];  Service: Orthopedics;  Laterality: Left;   HOT HEMOSTASIS N/A 01/08/2021   Procedure: HOT HEMOSTASIS (ARGON PLASMA COAGULATION/BICAP);  Surgeon: Milus Banister, MD;  Location: [facility] ENDOSCOPY;  Service: Endoscopy;  Laterality: N/A;   IR RADIOLOGIST EVAL & MGMT  12/31/2020   JOINT REPLACEMENT     TONSILLECTOMY     TOTAL HIP ARTHROPLASTY Right 10/15/2019   Procedure: TOTAL HIP ARTHROPLASTY ANTERIOR APPROACH;  Surgeon: Paralee Cancel, MD;  Location: [facility];  Service: Orthopedics;  Laterality: Right;  70 mins   VASCULAR SURGERY     Family History Family History  Problem Relation Age of Onset   Heart disease Mother 18   Hypertension Mother    Arthritis Mother    Heart failure Father 65   Stroke Maternal Aunt    Heart failure Sister    Colon cancer Neg Hx    Esophageal cancer Neg Hx    Stomach cancer Neg Hx    Rectal cancer Neg Hx     Social History Social History   Tobacco Use   Smoking status: Former    Packs/day: 1.00    Years: 20.00    Total pack years: 20.00    Types: Cigarettes   Smokeless tobacco: Never   Tobacco comments:       quit 20 years  smoked on and off for 20 years  Vaping Use   Vaping Use: Never used  Substance Use Topics   Alcohol use: Not Currently    Alcohol/week: 3.0 standard drinks of alcohol    Types: 3 Glasses of wine per week    Comment: h/o heavy use   Drug use: Never   Allergies Penicillin g sodium and Penicillins  Review of Systems Review of Systems  All other systems reviewed and are negative.   Physical Exam Vital Signs  I have reviewed the triage vital signs BP (!) 120/50   Pulse (!) 59   Temp 97.6 F (36.4 C) (Oral)   Resp (!) 22   Ht 6' (1.829 m)   Wt 81.6 kg   SpO2 98%   BMI 24.41 kg/m   Physical Exam Constitutional:      General: He is not in acute distress.    Appearance: Normal appearance.  HENT:     Head: Normocephalic and atraumatic.      Nose: No congestion or rhinorrhea.  Eyes:     General:        Right eye: No discharge.        Left eye: No discharge.     Extraocular Movements: Extraocular movements intact.     Pupils: Pupils are equal, round, and reactive to light.  Cardiovascular:     Rate and Rhythm: Normal rate and regular rhythm.     Heart sounds: No murmur heard. Pulmonary:     Effort: No respiratory distress.     Breath sounds: No wheezing or rales.  Abdominal:     General: There is no distension.     Tenderness: There is no abdominal tenderness.  Musculoskeletal:        General: Normal range of motion.     Cervical back: Normal range of motion.  Skin:    General: Skin is warm and dry.  Neurological:     General: No focal deficit present.     Mental Status: He is alert.     ED Results and Treatments Labs (all labs ordered are listed,  but only abnormal results are displayed) Labs Reviewed  COMPREHENSIVE METABOLIC PANEL - Abnormal; Notable for the following components:      Result Value   Potassium 2.9 (*)    BUN 29 (*)    Creatinine, Ser 2.40 (*)    Calcium 8.0 (*)    Total Protein 5.7 (*)    Albumin 3.1 (*)    GFR, Estimated 27 (*)    All other components within normal limits  CBC WITH DIFFERENTIAL/PLATELET - Abnormal; Notable for the following components:   WBC 3.8 (*)    RBC 2.49 (*)    Hemoglobin 8.9 (*)    HCT 26.6 (*)    MCV 106.8 (*)    MCH 35.7 (*)    Platelets 121 (*)    All other components within normal limits  URINALYSIS, ROUTINE W REFLEX MICROSCOPIC  CBG MONITORING, ED  TROPONIN I (HIGH SENSITIVITY)  TROPONIN I (HIGH SENSITIVITY)                                                                                                                          Radiology CT Head Wo Contrast  Result Date: 12/14/2021 CLINICAL DATA:  81 year old male with dizziness, loss of consciousness, and fall. EXAM: CT HEAD WITHOUT CONTRAST TECHNIQUE: Contiguous axial images were obtained from the  base of the skull through the vertex without intravenous contrast. RADIATION DOSE REDUCTION: This exam was performed according to the departmental dose-optimization program which includes automated exposure control, adjustment of the mA and/or kV according to patient size and/or use of iterative reconstruction technique. COMPARISON:  Brain MRI 05/02/2021.  Head CT 12/02/2021 and earlier. FINDINGS: Brain: Chronic cerebral volume loss with ventricular enlargement that may be ex vacuo. No midline shift, mass effect, evidence of mass lesion, intracranial hemorrhage or evidence of cortically based acute infarction. Stable gray-white matter differentiation since last year, mild for age chronic white matter hypodensity. Vascular: Calcified atherosclerosis at the skull base. No suspicious intracranial vascular hyperdensity. Skull: No acute osseous abnormality identified. Sinuses/Orbits: Visualized paranasal sinuses and mastoids are stable and well aerated. Other: No acute orbit or scalp soft tissue finding. IMPRESSION: No acute intracranial abnormality. No acute traumatic injury identified. Electronically Signed   By: Genevie Ann M.D.   On: 12/14/2021 05:35    Pertinent labs & imaging results that were available during my care of the patient were reviewed by me and considered in my medical decision making (see MDM for details).  Medications Ordered in ED Medications - No data to display  Procedures Procedures  (including critical care time)  Medical Decision Making / ED Course   This patient presents to the ED for concern of fall, this involves an extensive number of treatment options, and is a complaint that carries with it a high risk of complications and morbidity.  The differential diagnosis includes mechanical fall, alcohol intoxication, fracture, contusion, electrolyte  abnormality  MDM: Patient seen in the emergency room for evaluation of a fall.  Physical exam unremarkable with an old skin tear to the dorsal surface of the left hand that is currently receiving active wound care with Xeroform but is otherwise unremarkable.  Laboratory evaluation with a BUN of 29, creatinine 2.4 consistent with his ESRD.  Mild hypokalemia to 2.9, hemoglobin 8.9 which is at patient's baseline, mild leukopenia to 3.8.  Troponin unremarkable.  CT head unremarkable.  Patient was able to ambulate in the emergency department without difficulty using his walker.  Patient may have slipped out of bed secondary to his alcohol use today, but as his functional mobility is appropriate and he has an overall reassuring work-up, patient safer discharge with outpatient follow-up.   Additional history obtained:  -External records from outside source obtained and reviewed including: Chart review including previous notes, labs, imaging, consultation notes   Lab Tests: -I ordered, reviewed, and interpreted labs.   The pertinent results include:   Labs Reviewed  COMPREHENSIVE METABOLIC PANEL - Abnormal; Notable for the following components:      Result Value   Potassium 2.9 (*)    BUN 29 (*)    Creatinine, Ser 2.40 (*)    Calcium 8.0 (*)    Total Protein 5.7 (*)    Albumin 3.1 (*)    GFR, Estimated 27 (*)    All other components within normal limits  CBC WITH DIFFERENTIAL/PLATELET - Abnormal; Notable for the following components:   WBC 3.8 (*)    RBC 2.49 (*)    Hemoglobin 8.9 (*)    HCT 26.6 (*)    MCV 106.8 (*)    MCH 35.7 (*)    Platelets 121 (*)    All other components within normal limits  URINALYSIS, ROUTINE W REFLEX MICROSCOPIC  CBG MONITORING, ED  TROPONIN I (HIGH SENSITIVITY)  TROPONIN I (HIGH SENSITIVITY)      EKG   EKG Interpretation  Date/Time:  Tuesday December 14 2021 02:00:30 EDT Ventricular Rate:  57 PR Interval:  196 QRS Duration: 125 QT Interval:  560 QTC  Calculation: 546 R Axis:   227 Text Interpretation: Sinus rhythm Confirmed by [place] ([id number]) on 12/14/2021 3:10:36 AM         Imaging Studies ordered: I ordered imaging studies including CT head I independently visualized and interpreted imaging. I agree with the radiologist interpretation   Medicines ordered and prescription drug management: No orders of the defined types were placed in this encounter.   -I have reviewed the patients home medicines and have made adjustments as needed  Critical interventions none   Cardiac Monitoring: The patient was maintained on a cardiac monitor.  I personally viewed and interpreted the cardiac monitored which showed an underlying rhythm of: NSR  Social Determinants of Health:  Factors impacting patients care include: none   Reevaluation: After the interventions noted above, I reevaluated the patient and found that they have :improved  Co morbidities that complicate the patient evaluation  Past Medical History:  Diagnosis Date   Alcohol abuse 76/22/6333   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[place]) 03/17/2020  Alcoholism ([place])    in remission following wife's death   Anemia    Anxiety    Ascending aorta dilation (HCC)    Atrial fibrillation (HCC)    Cardiac arrest ([place]) 04/20/2019   12 min CPR with epi   Chronic diastolic CHF (congestive heart failure) ([place])    Dysrhythmia    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[place])    Mon Wed Fri   Fatty liver    Gastritis    GERD (gastroesophageal reflux disease)    H/O seasonal allergies    History of blood transfusion    History of GI bleed    Hx of adenomatous colonic polyps 08/10/2017   Hyperlipidemia    Hypertension    Insomnia    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   Mallory-Weiss tear    OA (osteoarthritis)    hands   OSA (obstructive sleep apnea)    No longer uses CPAP   Persistent atrial fibrillation with rapid ventricular response ([place])  02/05/2020   Recurrent syncope    Restless legs syndrome (RLS)    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(HCC)       Dispostion: I considered admission for this patient, but with overall negative work-up and successful ambulation trial, patient safe for discharge with outpatient follow-up.     Final Clinical Impression(s) / ED Diagnoses Final diagnoses:  Fall, initial encounter     '@PCDICTATION'$ @    Darragh Nay, [place], MD 12/14/21 [phone]

## 2021-12-14 NOTE — ED Notes (Signed)
EKG at Dr. Matilde Sprang computer.

## 2021-12-14 NOTE — ED Triage Notes (Addendum)
Pt via [facility] from home after syncopal episode. Hemodialysis pt M/W/F dialyzed on Monday with dizziness and LOC Monday night at home, unsure of whether he was already in bed or in the process of going to bed when he passed out and fell. Skin tear to right elbow, bleeding controlled. Pt denies hitting head and denies blood thinners. Denies pain, dizziness, lightheadedness on arrival. EMS reports initial BP 88/45 on scene, improved to 112/49 after 500cc NS bolus. Hx aFib, NSR noted on EMS rhythm strip. A/O x 4.

## 2021-12-15 ENCOUNTER — Emergency Department (HOSPITAL_COMMUNITY): Payer: Medicare Other

## 2021-12-15 ENCOUNTER — Telehealth: Payer: Self-pay | Admitting: *Deleted

## 2021-12-15 ENCOUNTER — Emergency Department (HOSPITAL_COMMUNITY)
Admission: EM | Admit: 2021-12-15 | Discharge: 2021-12-16 | Disposition: A | Discharge status: Home / Self Care | Payer: Medicare Other | Attending: Emergency Medicine | Admitting: Emergency Medicine

## 2021-12-15 ENCOUNTER — Other Ambulatory Visit: Payer: Self-pay

## 2021-12-15 DIAGNOSIS — N2581 Secondary hyperparathyroidism of renal origin: Secondary | ICD-10-CM | POA: Diagnosis not present

## 2021-12-15 DIAGNOSIS — M4802 Spinal stenosis, cervical region: Secondary | ICD-10-CM | POA: Diagnosis not present

## 2021-12-15 DIAGNOSIS — I132 Hypertensive heart and chronic kidney disease with heart failure and with stage 5 chronic kidney disease, or end stage renal disease: Secondary | ICD-10-CM | POA: Insufficient documentation

## 2021-12-15 DIAGNOSIS — I959 Hypotension, unspecified: Secondary | ICD-10-CM | POA: Diagnosis not present

## 2021-12-15 DIAGNOSIS — S0990XA Unspecified injury of head, initial encounter: Secondary | ICD-10-CM | POA: Diagnosis not present

## 2021-12-15 DIAGNOSIS — D631 Anemia in chronic kidney disease: Secondary | ICD-10-CM | POA: Diagnosis not present

## 2021-12-15 DIAGNOSIS — R451 Restlessness and agitation: Secondary | ICD-10-CM | POA: Diagnosis not present

## 2021-12-15 DIAGNOSIS — N186 End stage renal disease: Secondary | ICD-10-CM | POA: Insufficient documentation

## 2021-12-15 DIAGNOSIS — I4430 Unspecified atrioventricular block: Secondary | ICD-10-CM | POA: Diagnosis not present

## 2021-12-15 DIAGNOSIS — R55 Syncope and collapse: Secondary | ICD-10-CM | POA: Diagnosis not present

## 2021-12-15 DIAGNOSIS — D509 Iron deficiency anemia, unspecified: Secondary | ICD-10-CM | POA: Diagnosis not present

## 2021-12-15 DIAGNOSIS — S060X9A Concussion with loss of consciousness of unspecified duration, initial encounter: Secondary | ICD-10-CM | POA: Diagnosis not present

## 2021-12-15 DIAGNOSIS — R300 Dysuria: Secondary | ICD-10-CM | POA: Diagnosis not present

## 2021-12-15 DIAGNOSIS — E876 Hypokalemia: Secondary | ICD-10-CM | POA: Diagnosis not present

## 2021-12-15 DIAGNOSIS — Z992 Dependence on renal dialysis: Secondary | ICD-10-CM | POA: Insufficient documentation

## 2021-12-15 DIAGNOSIS — W19XXXA Unspecified fall, initial encounter: Secondary | ICD-10-CM | POA: Diagnosis not present

## 2021-12-15 DIAGNOSIS — R0902 Hypoxemia: Secondary | ICD-10-CM | POA: Diagnosis not present

## 2021-12-15 DIAGNOSIS — I5030 Unspecified diastolic (congestive) heart failure: Secondary | ICD-10-CM | POA: Diagnosis not present

## 2021-12-15 DIAGNOSIS — I6523 Occlusion and stenosis of bilateral carotid arteries: Secondary | ICD-10-CM | POA: Diagnosis not present

## 2021-12-15 DIAGNOSIS — M47812 Spondylosis without myelopathy or radiculopathy, cervical region: Secondary | ICD-10-CM | POA: Diagnosis not present

## 2021-12-15 DIAGNOSIS — R296 Repeated falls: Secondary | ICD-10-CM

## 2021-12-15 DIAGNOSIS — S199XXA Unspecified injury of neck, initial encounter: Secondary | ICD-10-CM | POA: Diagnosis not present

## 2021-12-15 LAB — CBC
HCT: 26.8 % — ABNORMAL LOW (ref 39.0–52.0)
Hemoglobin: 9.2 g/dL — ABNORMAL LOW (ref 13.0–17.0)
MCH: 35.7 pg — ABNORMAL HIGH (ref 26.0–34.0)
MCHC: 34.3 g/dL (ref 30.0–36.0)
MCV: 103.9 fL — ABNORMAL HIGH (ref 80.0–100.0)
Platelets: 169 10*3/uL (ref 150–400)
RBC: 2.58 MIL/uL — ABNORMAL LOW (ref 4.22–5.81)
RDW: 14.6 % (ref 11.5–15.5)
WBC: 5.2 10*3/uL (ref 4.0–10.5)
nRBC: 0 % (ref 0.0–0.2)

## 2021-12-15 LAB — BASIC METABOLIC PANEL
Anion gap: 13 (ref 5–15)
BUN: 19 mg/dL (ref 8–23)
CO2: 27 mmol/L (ref 22–32)
Calcium: 8.2 mg/dL — ABNORMAL LOW (ref 8.9–10.3)
Chloride: 97 mmol/L — ABNORMAL LOW (ref 98–111)
Creatinine, Ser: 1.92 mg/dL — ABNORMAL HIGH (ref 0.61–1.24)
GFR, Estimated: 35 mL/min — ABNORMAL LOW (ref >60)
Glucose, Bld: 89 mg/dL (ref 70–99)
Potassium: 3.1 mmol/L — ABNORMAL LOW (ref 3.5–5.1)
Sodium: 137 mmol/L (ref 135–145)

## 2021-12-15 LAB — TROPONIN I (HIGH SENSITIVITY): Troponin I (High Sensitivity): 5 ng/L (ref <18)

## 2021-12-15 NOTE — ED Provider Triage Note (Signed)
Emergency Medicine Provider Triage Evaluation Note  Darryl Diaz , a 81 y.o. male  was evaluated in triage.  Pt complains of fall.  Patient had an unwitnessed fall around 9 PM.  Patient does remember symptoms prior to fall, details of fall.  All he remembers is getting up off the ground after the fall.  He is dialysis patient and had dialysis earlier today and states that they may have taken up more fluid than normal.  He does not know if he hit his head.  He is complaining of no musculoskeletal pain or other pain currently.  He states he just wants to go home so he can sleep.  Denies fever, chills, night sweats, chest pain, shortness of breath, abdominal pain, nausea/vomiting  Review of Systems  Positive: See above Negative:   Physical Exam  BP 109/62 (BP Location: Right Arm)   Pulse 67   Temp 98.6 F (37 C) (Oral)   Resp 18   SpO2 98%  Gen:   Awake, no distress   Resp:  Normal effort  MSK:   Moves extremities without difficulty  Other:  No tenderness to palpation along upper or lower extremities.  He moves all 4 extremities without difficulty.  No tenderness palpation of abdomen.  No tenderness to palpation of cervical, thoracic, lumbar spine with no evidences of step-offs or deformities noted.  Head is normocephalic atraumatic.  Medical Decision Making  Medically screening exam initiated at 10:49 PM.  Appropriate orders placed.  HARDY HARCUM was informed that the remainder of the evaluation will be completed by another provider, this initial triage assessment does not replace that evaluation, and the importance of remaining in the ED until their evaluation is complete.     Wilnette Kales, [place] 12/15/21 2252

## 2021-12-15 NOTE — ED Triage Notes (Signed)
Patient BIB EMS for evaluation of syncopal episode when getting up from bed.  Episode happened around 9:00 PM.  Had dialysis today and doesn't remember if they "took off more fluid than normal."  Pt alert and oriented x 4.  Has 18 G R AC

## 2021-12-15 NOTE — Chronic Care Management (AMB) (Signed)
  Care Coordination  Outreach Note  12/15/2021 Name: KENDRIK MCSHAN MRN: [id number] DOB: Feb 25, 1941   Care Coordination Outreach Attempts  An unsuccessful telephone outreach was attempted today to offer the patient information about available care coordination services as a benefit of their health plan.   Received referral   Follow Up Plan:  Additional outreach attempts will be made to offer the patient care coordination information and services.   Encounter Outcome:  No Answer  Julian Hy, [place] Direct Dial: [phone]

## 2021-12-16 DIAGNOSIS — Z7401 Bed confinement status: Secondary | ICD-10-CM | POA: Diagnosis not present

## 2021-12-16 DIAGNOSIS — R29898 Other symptoms and signs involving the musculoskeletal system: Secondary | ICD-10-CM | POA: Diagnosis not present

## 2021-12-16 NOTE — ED Notes (Signed)
PTAR crew advises that pt does not meet medical necessity criteria to be transported by ambulance. Will work on other means of transportation on getting pt home, informed EDP.

## 2021-12-16 NOTE — ED Notes (Signed)
I provided reinforced discharge education based off of after visit summary/care provided. Pt acknowledged and understood my education. Pt had no further questions/concerns for provider/myself. After visit summary provided to pt. 

## 2021-12-16 NOTE — ED Provider Notes (Signed)
[facility] DEPT Provider Note   CSN: [id number] Arrival date & time: 12/15/21  2141     History  Chief Complaint  Patient presents with   Loss of Consciousness    Darryl Diaz is a 81 y.o. male.  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  Incident occurred around 2100.  Patient cannot expand on details of the fall, but believes he was using his rolling walker at the time.  He does remember getting up off the ground.  Has been compliant with dialysis, last session earlier today.  Questions whether they may have taken more fluid than normal.  He has no complaints of headache, body pain, back pain.  Denies fevers, chills, chest pain, shortness of breath, abdominal pain, nausea and vomiting.  The history is provided by the patient. No language interpreter was used.  Loss of Consciousness      Home Medications Prior to Admission medications   Medication Sig Start Date End Date Taking? Authorizing Provider  amiodarone (PACERONE) 200 MG tablet TAKE 1 TABLET BY MOUTH ONCE DAILY Patient taking differently: Take 200 mg by mouth every morning. 05/17/21   Richardson Dopp T, PA-C  atorvastatin (LIPITOR) 20 MG tablet Take 1 tablet (20 mg total) by mouth daily. 03/10/20   Angiulli, Lavon Paganini, PA-C  carvedilol (COREG) 6.25 MG tablet Take 1 tablet (6.25 mg total) by mouth 2 (two) times daily. Patient taking differently: Take 12.5 mg by mouth daily. 02/11/21   Sherren Mocha, MD  cetirizine (ZYRTEC) 10 MG tablet Take 10 mg by mouth daily.    [provider]  escitalopram (LEXAPRO) 10 MG tablet Take 1 tablet (10 mg total) by mouth daily. Patient taking differently: Take 10 mg by mouth every morning. 03/10/20   Angiulli, Lavon Paganini, PA-C  ferrous sulfate 324 MG TBEC Take 324 mg by mouth every morning.    [provider]  FOLIC ACID PO Take 1 tablet by mouth every  morning.    [provider]  Magnesium Oxide 400 MG CAPS Take 1 capsule (400 mg total) by mouth daily. 10/17/20   Eugenie Filler, MD  Multiple Vitamin (MULTIVITAMIN PO) Take 1 tablet by mouth every other day.    [provider]  nitroGLYCERIN (NITROSTAT) 0.4 MG SL tablet Place 1 tablet (0.4 mg total) under the tongue every 5 (five) minutes x 3 doses as needed for chest pain. 07/28/20   Baldwin Jamaica, PA-C  pantoprazole (PROTONIX) 40 MG tablet Take 1 tablet (40 mg total) by mouth daily. Patient taking differently: Take 40 mg by mouth every morning. 03/10/20   Angiulli, Lavon Paganini, PA-C  sodium bicarbonate 650 MG tablet Take 650 mg by mouth daily.    [provider]  tamsulosin (FLOMAX) 0.4 MG CAPS capsule Take 0.4 mg by mouth at bedtime.    [provider]  torsemide (DEMADEX) 20 MG tablet Take 60 mg by mouth daily.    [provider]  traZODone (DESYREL) 50 MG tablet Take 1 tablet (50 mg total) by mouth at bedtime. 11/26/21   Alma Friendly, MD      Allergies    Penicillin g sodium and Penicillins    Review of Systems   Review of Systems  Cardiovascular:  Positive for syncope.  Ten systems reviewed and are negative for acute change, except as noted in the HPI.    Physical  Exam Updated Vital Signs BP 121/67 (BP Location: Right Arm)   Pulse 65   Temp 97.7 F (36.5 C) (Oral)   Resp 18   SpO2 100%   Physical Exam Vitals and nursing note reviewed.  Constitutional:      General: He is not in acute distress.    Appearance: He is well-developed. He is not diaphoretic.     Comments: Alert and nontoxic appearing  HENT:     Head: Normocephalic and atraumatic.     Comments: No hematoma or contusion to scalp. Eyes:     General: No scleral icterus.    Conjunctiva/sclera: Conjunctivae normal.  Cardiovascular:     Rate and Rhythm: Normal rate and regular rhythm.     Pulses: Normal pulses.  Pulmonary:     Effort: Pulmonary effort is  normal. No respiratory distress.     Breath sounds: No stridor. No wheezing.     Comments: Respirations even and unlabored Musculoskeletal:        General: Normal range of motion.     Cervical back: Normal range of motion.  Skin:    General: Skin is warm and dry.     Coloration: Skin is not pale.     Findings: No erythema or rash.     Comments: Scattered bruises varying in chronicity  Neurological:     Mental Status: He is alert and oriented to person, place, and time.     Comments: GCS 15. A&O x4. Speech is clear, goal oriented.   Psychiatric:        Speech: Speech normal.        Behavior: Behavior is agitated (mild). Behavior is cooperative.     ED Results / Procedures / Treatments   Labs (all labs ordered are listed, but only abnormal results are displayed) Labs Reviewed  BASIC METABOLIC PANEL - Abnormal; Notable for the following components:      Result Value   Potassium 3.1 (*)    Chloride 97 (*)    Creatinine, Ser 1.92 (*)    Calcium 8.2 (*)    GFR, Estimated 35 (*)    All other components within normal limits  CBC - Abnormal; Notable for the following components:   RBC 2.58 (*)    Hemoglobin 9.2 (*)    HCT 26.8 (*)    MCV 103.9 (*)    MCH 35.7 (*)    All other components within normal limits  TROPONIN I (HIGH SENSITIVITY)    EKG None  Radiology CT Head Wo Contrast  Result Date: 12/15/2021 CLINICAL DATA:  Head trauma, minor (Age >= 65y); Neck trauma (Age >= 65y) EXAM: CT HEAD WITHOUT CONTRAST CT CERVICAL SPINE WITHOUT CONTRAST TECHNIQUE: Multidetector CT imaging of the head and cervical spine was performed following the standard protocol without intravenous contrast. Multiplanar CT image reconstructions of the cervical spine were also generated. RADIATION DOSE REDUCTION: This exam was performed according to the departmental dose-optimization program which includes automated exposure control, adjustment of the mA and/or kV according to patient size and/or use of  iterative reconstruction technique. COMPARISON:  CT head 12/02/2021, CT C-spine 11/19/2021 FINDINGS: CT HEAD FINDINGS BRAIN: BRAIN Cerebral ventricle sizes are concordant with the degree of cerebral volume loss. Patchy and confluent areas of decreased attenuation are noted throughout the deep and periventricular white matter of the cerebral hemispheres bilaterally, compatible with chronic microvascular ischemic disease. No evidence of large-territorial acute infarction. No parenchymal hemorrhage. No mass lesion. No extra-axial collection. No mass effect or midline shift.  No hydrocephalus. Basilar cisterns are patent. Vascular: No hyperdense vessel. Atherosclerotic calcifications are present within the cavernous internal carotid arteries. Skull: No acute fracture or focal lesion. Sinuses/Orbits: Paranasal sinuses and mastoid air cells are clear. Right lens replacement. Otherwise the orbits are unremarkable. Other: None. CT CERVICAL SPINE FINDINGS Alignment: Normal. 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 No acute fracture. No aggressive appearing focal osseous lesion or focal pathologic process. Soft tissues and spinal canal: No prevertebral fluid or swelling. No visible canal hematoma. Upper chest: Unremarkable. Other: None. IMPRESSION: 1. No acute intracranial abnormality. 2. No acute displaced fracture or traumatic listhesis of the cervical spine. 3. 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 Electronically Signed   By: Iven Finn M.D.   On: 12/15/2021 23:44   CT Cervical Spine Wo Contrast  Result Date: 12/15/2021 CLINICAL DATA:  Head trauma, minor (Age >= 65y); Neck trauma (Age >= 65y) EXAM: CT HEAD WITHOUT  CONTRAST CT CERVICAL SPINE WITHOUT CONTRAST TECHNIQUE: Multidetector CT imaging of the head and cervical spine was performed following the standard protocol without intravenous contrast. Multiplanar CT image reconstructions of the cervical spine were also generated. RADIATION DOSE REDUCTION: This exam was performed according to the departmental dose-optimization program which includes automated exposure control, adjustment of the mA and/or kV according to patient size and/or use of iterative reconstruction technique. COMPARISON:  CT head 12/02/2021, CT C-spine 11/19/2021 FINDINGS: CT HEAD FINDINGS BRAIN: BRAIN Cerebral ventricle sizes are concordant with the degree of cerebral volume loss. Patchy and confluent areas of decreased attenuation are noted throughout the deep and periventricular white matter of the cerebral hemispheres bilaterally, compatible with chronic microvascular ischemic disease. No evidence of large-territorial acute infarction. No parenchymal hemorrhage. No mass lesion. No extra-axial collection. No mass effect or midline shift. No hydrocephalus. Basilar cisterns are patent. Vascular: No hyperdense vessel. Atherosclerotic calcifications are present within the cavernous internal carotid arteries. Skull: No acute fracture or focal lesion. Sinuses/Orbits: Paranasal sinuses and mastoid air cells are clear. Right lens replacement. Otherwise the orbits are unremarkable. Other: None. CT CERVICAL SPINE FINDINGS Alignment: Normal. 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 No acute fracture. No aggressive appearing focal osseous lesion or focal pathologic process. Soft tissues and spinal canal: No prevertebral fluid or swelling. No visible canal hematoma. Upper chest: Unremarkable. Other: None. IMPRESSION: 1. No acute intracranial abnormality.  2. No acute displaced fracture or traumatic listhesis of the cervical spine. 3. 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 Electronically Signed   By: Iven Finn M.D.   On: 12/15/2021 23:44   CT Head Wo Contrast  Result Date: 12/14/2021 CLINICAL DATA:  81 year old male with dizziness, loss of consciousness, and fall. EXAM: CT HEAD WITHOUT CONTRAST TECHNIQUE: Contiguous axial images were obtained from the base of the skull through the vertex without intravenous contrast. RADIATION DOSE REDUCTION: This exam was performed according to the departmental dose-optimization program which includes automated exposure control, adjustment of the mA and/or kV according to patient size and/or use of iterative reconstruction technique. COMPARISON:  Brain MRI  05/02/2021.  Head CT 12/02/2021 and earlier. FINDINGS: Brain: Chronic cerebral volume loss with ventricular enlargement that may be ex vacuo. No midline shift, mass effect, evidence of mass lesion, intracranial hemorrhage or evidence of cortically based acute infarction. Stable gray-white matter differentiation since last year, mild for age chronic white matter hypodensity. Vascular: Calcified atherosclerosis at the skull base. No suspicious intracranial vascular hyperdensity. Skull: No acute osseous abnormality identified. Sinuses/Orbits: Visualized paranasal sinuses and mastoids are stable and well aerated. Other: No acute orbit or scalp soft tissue finding. IMPRESSION: No acute intracranial abnormality. No acute traumatic injury identified. Electronically Signed   By: Genevie Ann M.D.   On: 12/14/2021 05:35    Procedures Procedures    Medications Ordered in ED Medications - No data to display  ED Course/ Medical Decision Making/ A&P                           Medical Decision Making Amount and/or Complexity of Data  Reviewed Labs: ordered.   This patient presents to the ED for concern of fall, this involves an extensive number of treatment options, and is a complaint that carries with it a high risk of complications and morbidity.  The differential diagnosis includes contusion vs ICH vs fx vs dehydration   Co morbidities that complicate the patient evaluation  HTN ESRD ETOH abuse   Additional history obtained:  Additional history obtained from EMS External records from outside source obtained and reviewed including head CT from 12/14/21   Lab Tests:  I Ordered, and personally interpreted labs.  The pertinent results include:  stable hypokalemia of 3.1, stable creatinine of 1.92, stable anemia of 9.2, negative troponin   Imaging Studies ordered:  I ordered imaging studies including head CT, C-spine CT  I independently visualized and interpreted imaging which showed no acute pathology I agree with the radiologist interpretation   Cardiac Monitoring:  The patient was maintained on a cardiac monitor.  I personally viewed and interpreted the cardiac monitored which showed an underlying rhythm of: sinus rhythm   Medicines ordered and prescription drug management:  I have reviewed the patients home medicines and have made adjustments as needed   Test Considered:  CXR port 1 view Pelvis port 1 view   Consultations Obtained:  I requested consultation with CM/SW - orders placed for home health face-to-face evaluation. This can be completed with the patient as an outpatient   Reevaluation:  After the interventions noted above, I reevaluated the patient and found that they have : remained stable   Social Determinants of Health:  Insured Lives alone   Dispostion:  After consideration of the diagnostic results and the patients response to treatment, I feel that the patent would benefit from outpatient PCP follow up PRN. Discussed importance of using assist devices with  ambulation. Return precautions discussed and provided. Patient discharged in stable condition with no unaddressed concerns.          Final Clinical Impression(s) / ED Diagnoses Final diagnoses:  Unwitnessed fall    Rx / DC Orders ED Discharge Orders     None         Antonietta Breach, PA-C 12/16/21 0310    Palumbo, April, MD 12/16/21 [phone]

## 2021-12-16 NOTE — ED Notes (Signed)
Pt informed me that he does not want any treatment or exam and wants to go home. Pt states that "You all are keeping me here against my will." Informed pt strong recommendation to stay and continue evaluation and care. Pt still wishes to leave. Notified EDP.

## 2021-12-16 NOTE — ED Notes (Signed)
[facility] CALLED FOR TRANSPORT.

## 2021-12-16 NOTE — ED Notes (Signed)
[facility] has returned to transport pt home.

## 2021-12-16 NOTE — ED Notes (Signed)
Pt yelling in the waiting room that he wants to go home. Pt moved to TR9 to try and calm him down

## 2021-12-17 ENCOUNTER — Other Ambulatory Visit: Payer: Self-pay | Admitting: *Deleted

## 2021-12-17 DIAGNOSIS — D509 Iron deficiency anemia, unspecified: Secondary | ICD-10-CM | POA: Diagnosis not present

## 2021-12-17 DIAGNOSIS — Z992 Dependence on renal dialysis: Secondary | ICD-10-CM | POA: Diagnosis not present

## 2021-12-17 DIAGNOSIS — D631 Anemia in chronic kidney disease: Secondary | ICD-10-CM | POA: Diagnosis not present

## 2021-12-17 DIAGNOSIS — N2581 Secondary hyperparathyroidism of renal origin: Secondary | ICD-10-CM | POA: Diagnosis not present

## 2021-12-17 DIAGNOSIS — N186 End stage renal disease: Secondary | ICD-10-CM | POA: Diagnosis not present

## 2021-12-17 NOTE — Patient Outreach (Signed)
[facility] Coordinator follow up.   Message received from [facility] stating Mr. Chong declined home visit today. States [facility] home health has rescheduled home visit on Monday.  [facility] RNCM for care coordination referral made previously.    [name], MSN, RN,BSN [facility] Coordinator [phone] [facility]) [phone]  (Toll free office)

## 2021-12-19 ENCOUNTER — Emergency Department (HOSPITAL_COMMUNITY)
Admission: EM | Admit: 2021-12-19 | Discharge: 2021-12-21 | Disposition: A | Discharge status: Skilled Nursing Facility | Payer: Medicare Other | Attending: Emergency Medicine | Admitting: Emergency Medicine

## 2021-12-19 ENCOUNTER — Other Ambulatory Visit: Payer: Self-pay

## 2021-12-19 ENCOUNTER — Emergency Department (HOSPITAL_COMMUNITY): Payer: Medicare Other

## 2021-12-19 ENCOUNTER — Encounter (HOSPITAL_COMMUNITY): Payer: Self-pay | Admitting: Emergency Medicine

## 2021-12-19 DIAGNOSIS — I509 Heart failure, unspecified: Secondary | ICD-10-CM | POA: Insufficient documentation

## 2021-12-19 DIAGNOSIS — I132 Hypertensive heart and chronic kidney disease with heart failure and with stage 5 chronic kidney disease, or end stage renal disease: Secondary | ICD-10-CM | POA: Diagnosis not present

## 2021-12-19 DIAGNOSIS — R531 Weakness: Secondary | ICD-10-CM | POA: Diagnosis not present

## 2021-12-19 DIAGNOSIS — E871 Hypo-osmolality and hyponatremia: Secondary | ICD-10-CM | POA: Diagnosis present

## 2021-12-19 DIAGNOSIS — Z20822 Contact with and (suspected) exposure to covid-19: Secondary | ICD-10-CM | POA: Diagnosis not present

## 2021-12-19 DIAGNOSIS — Z79899 Other long term (current) drug therapy: Secondary | ICD-10-CM | POA: Diagnosis not present

## 2021-12-19 DIAGNOSIS — I5032 Chronic diastolic (congestive) heart failure: Secondary | ICD-10-CM | POA: Diagnosis present

## 2021-12-19 DIAGNOSIS — K7030 Alcoholic cirrhosis of liver without ascites: Secondary | ICD-10-CM | POA: Diagnosis present

## 2021-12-19 DIAGNOSIS — K219 Gastro-esophageal reflux disease without esophagitis: Secondary | ICD-10-CM | POA: Diagnosis present

## 2021-12-19 DIAGNOSIS — F32A Depression, unspecified: Secondary | ICD-10-CM | POA: Diagnosis present

## 2021-12-19 DIAGNOSIS — E785 Hyperlipidemia, unspecified: Secondary | ICD-10-CM | POA: Diagnosis present

## 2021-12-19 DIAGNOSIS — R2681 Unsteadiness on feet: Secondary | ICD-10-CM | POA: Diagnosis not present

## 2021-12-19 DIAGNOSIS — I959 Hypotension, unspecified: Secondary | ICD-10-CM | POA: Diagnosis not present

## 2021-12-19 DIAGNOSIS — R062 Wheezing: Secondary | ICD-10-CM | POA: Diagnosis not present

## 2021-12-19 DIAGNOSIS — R110 Nausea: Secondary | ICD-10-CM | POA: Diagnosis not present

## 2021-12-19 DIAGNOSIS — I4891 Unspecified atrial fibrillation: Secondary | ICD-10-CM | POA: Diagnosis not present

## 2021-12-19 DIAGNOSIS — Z992 Dependence on renal dialysis: Secondary | ICD-10-CM | POA: Insufficient documentation

## 2021-12-19 DIAGNOSIS — N186 End stage renal disease: Secondary | ICD-10-CM | POA: Diagnosis not present

## 2021-12-19 DIAGNOSIS — R001 Bradycardia, unspecified: Secondary | ICD-10-CM | POA: Diagnosis not present

## 2021-12-19 DIAGNOSIS — R296 Repeated falls: Secondary | ICD-10-CM | POA: Diagnosis not present

## 2021-12-19 DIAGNOSIS — I10 Essential (primary) hypertension: Secondary | ICD-10-CM | POA: Diagnosis present

## 2021-12-19 DIAGNOSIS — W19XXXA Unspecified fall, initial encounter: Secondary | ICD-10-CM | POA: Diagnosis not present

## 2021-12-19 DIAGNOSIS — I2510 Atherosclerotic heart disease of native coronary artery without angina pectoris: Secondary | ICD-10-CM | POA: Diagnosis present

## 2021-12-19 DIAGNOSIS — D638 Anemia in other chronic diseases classified elsewhere: Secondary | ICD-10-CM | POA: Diagnosis present

## 2021-12-19 DIAGNOSIS — I480 Paroxysmal atrial fibrillation: Secondary | ICD-10-CM | POA: Diagnosis present

## 2021-12-19 LAB — COMPREHENSIVE METABOLIC PANEL
ALT: 16 U/L (ref 0–44)
AST: 29 U/L (ref 15–41)
Albumin: 3 g/dL — ABNORMAL LOW (ref 3.5–5.0)
Alkaline Phosphatase: 119 U/L (ref 38–126)
Anion gap: 12 (ref 5–15)
BUN: 27 mg/dL — ABNORMAL HIGH (ref 8–23)
CO2: 26 mmol/L (ref 22–32)
Calcium: 8.4 mg/dL — ABNORMAL LOW (ref 8.9–10.3)
Chloride: 92 mmol/L — ABNORMAL LOW (ref 98–111)
Creatinine, Ser: 3.13 mg/dL — ABNORMAL HIGH (ref 0.61–1.24)
GFR, Estimated: 19 mL/min — ABNORMAL LOW (ref >60)
Glucose, Bld: 86 mg/dL (ref 70–99)
Potassium: 3.8 mmol/L (ref 3.5–5.1)
Sodium: 130 mmol/L — ABNORMAL LOW (ref 135–145)
Total Bilirubin: 1.1 mg/dL (ref 0.3–1.2)
Total Protein: 5.4 g/dL — ABNORMAL LOW (ref 6.5–8.1)

## 2021-12-19 LAB — CBC WITH DIFFERENTIAL/PLATELET
Abs Immature Granulocytes: 0.02 10*3/uL (ref 0.00–0.07)
Basophils Absolute: 0 10*3/uL (ref 0.0–0.1)
Basophils Relative: 1 %
Eosinophils Absolute: 0.1 10*3/uL (ref 0.0–0.5)
Eosinophils Relative: 3 %
HCT: 25.3 % — ABNORMAL LOW (ref 39.0–52.0)
Hemoglobin: 8.9 g/dL — ABNORMAL LOW (ref 13.0–17.0)
Immature Granulocytes: 1 %
Lymphocytes Relative: 17 %
Lymphs Abs: 0.8 10*3/uL (ref 0.7–4.0)
MCH: 35.5 pg — ABNORMAL HIGH (ref 26.0–34.0)
MCHC: 35.2 g/dL (ref 30.0–36.0)
MCV: 100.8 fL — ABNORMAL HIGH (ref 80.0–100.0)
Monocytes Absolute: 0.6 10*3/uL (ref 0.1–1.0)
Monocytes Relative: 12 %
Neutro Abs: 3 10*3/uL (ref 1.7–7.7)
Neutrophils Relative %: 66 %
Platelets: 155 10*3/uL (ref 150–400)
RBC: 2.51 MIL/uL — ABNORMAL LOW (ref 4.22–5.81)
RDW: 14.5 % (ref 11.5–15.5)
WBC: 4.4 10*3/uL (ref 4.0–10.5)
nRBC: 0 % (ref 0.0–0.2)

## 2021-12-19 LAB — AMMONIA: Ammonia: 16 umol/L (ref 9–35)

## 2021-12-19 LAB — PHOSPHORUS: Phosphorus: 2.7 mg/dL (ref 2.5–4.6)

## 2021-12-19 LAB — MAGNESIUM: Magnesium: 1.9 mg/dL (ref 1.7–2.4)

## 2021-12-19 MED ORDER — TAMSULOSIN HCL 0.4 MG PO CAPS
0.4000 mg | ORAL_CAPSULE | Freq: Every day | ORAL | Status: DC
  Administered 2021-12-19 – 2021-12-21 (×2): 0.4 mg via ORAL
  Filled 2021-12-19 (×2): qty 1

## 2021-12-19 MED ORDER — CARVEDILOL 12.5 MG PO TABS
12.5000 mg | ORAL_TABLET | Freq: Every day | ORAL | Status: DC
  Administered 2021-12-20 – 2021-12-21 (×2): 12.5 mg via ORAL
  Filled 2021-12-19 (×2): qty 1

## 2021-12-19 MED ORDER — FERROUS SULFATE 325 (65 FE) MG PO TABS
324.0000 mg | ORAL_TABLET | Freq: Every day | ORAL | Status: DC
  Administered 2021-12-20 – 2021-12-21 (×2): 324 mg via ORAL
  Filled 2021-12-19 (×2): qty 1

## 2021-12-19 MED ORDER — PANTOPRAZOLE SODIUM 40 MG PO TBEC
40.0000 mg | DELAYED_RELEASE_TABLET | Freq: Every day | ORAL | Status: DC
  Administered 2021-12-20 – 2021-12-21 (×2): 40 mg via ORAL
  Filled 2021-12-19 (×2): qty 1

## 2021-12-19 MED ORDER — ESCITALOPRAM OXALATE 10 MG PO TABS
10.0000 mg | ORAL_TABLET | Freq: Every day | ORAL | Status: DC
  Administered 2021-12-20 – 2021-12-21 (×2): 10 mg via ORAL
  Filled 2021-12-19 (×2): qty 1

## 2021-12-19 MED ORDER — TRAZODONE HCL 50 MG PO TABS
50.0000 mg | ORAL_TABLET | Freq: Every day | ORAL | Status: DC
  Administered 2021-12-19 – 2021-12-21 (×2): 50 mg via ORAL
  Filled 2021-12-19 (×2): qty 1

## 2021-12-19 MED ORDER — ATORVASTATIN CALCIUM 10 MG PO TABS
20.0000 mg | ORAL_TABLET | Freq: Every day | ORAL | Status: DC
  Administered 2021-12-20 – 2021-12-21 (×2): 20 mg via ORAL
  Filled 2021-12-19 (×2): qty 2

## 2021-12-19 MED ORDER — AMIODARONE HCL 200 MG PO TABS
200.0000 mg | ORAL_TABLET | Freq: Every morning | ORAL | Status: DC
  Administered 2021-12-20 – 2021-12-21 (×2): 200 mg via ORAL
  Filled 2021-12-19 (×2): qty 1

## 2021-12-19 MED ORDER — LACTATED RINGERS IV BOLUS
500.0000 mL | Freq: Once | INTRAVENOUS | Status: AC
  Administered 2021-12-19: 500 mL via INTRAVENOUS

## 2021-12-19 MED ORDER — TORSEMIDE 20 MG PO TABS
60.0000 mg | ORAL_TABLET | Freq: Every day | ORAL | Status: DC
  Administered 2021-12-20 – 2021-12-21 (×2): 60 mg via ORAL
  Filled 2021-12-19 (×2): qty 3

## 2021-12-19 NOTE — ED Notes (Signed)
Pt given a urinal, reports does not need to urinate at this time, pt resting and watching TV, NAD noted, even RR and unlabored, call bell within reach for assistance, side rails up x2 for safety, pt voiced no concerns or questions at this time, care on going, will continue to monitor.

## 2021-12-19 NOTE — ED Provider Notes (Signed)
[facility] EMERGENCY DEPARTMENT Provider Note   CSN: [id number] Arrival date & time: 12/19/21  1605   History  Chief Complaint  Patient presents with   Weakness    Darryl Diaz is a 81 y.o. male PMH hypertension, hyperlipidemia, A-fib, alcoholic cirrhosis of the liver, CHF, ESRD, restless leg syndrome who is presenting with weakness.  Patient reports that he was recently seen at [facility], hospitalized following a fall.  He was discharged 2 to 3 days ago.  He currently lives alone.  He says that he has experienced multiple falls over the last several months, which he describes as the result of "losing my balance.  He normally ambulates with a walker.  Today, he awoke and felt like his usual self.  He began doing chores around the house, including stripping the bed sheets and doing laundry.  He suddenly noticed feeling very "lousy".  He reports feeling as if he needed to vomit and have a bowel movement at the same time.  He also noted some warm sensation and mild shortness of breath during this episode.  Denies any chest pain, dizziness, lightheadedness.  He felt weak and sat down on the bed, and called EMS.  Patient reports when EMS arrived, he was unable to get up and required assistance of 2 providers to get back to his rolling walker.  Currently, patient reports feeling much improved.  He denies any recent fevers, cough, congestion, abdominal pain.  Reports intermittent diarrhea, which has not changed.  He reports decreased appetite and decreased p.o. intake in the last 2 days.  He is ESRD and gets HD done on Monday Wednesday Friday, and reports good compliance with this.    Home Medications Prior to Admission medications   Medication Sig Start Date End Date Taking? Authorizing Provider  amiodarone (PACERONE) 200 MG tablet TAKE 1 TABLET BY MOUTH ONCE DAILY Patient taking differently: Take 200 mg by mouth every morning. 05/17/21   Richardson Dopp T, PA-C  atorvastatin  (LIPITOR) 20 MG tablet Take 1 tablet (20 mg total) by mouth daily. 03/10/20   Angiulli, Lavon Paganini, PA-C  carvedilol (COREG) 6.25 MG tablet Take 1 tablet (6.25 mg total) by mouth 2 (two) times daily. Patient taking differently: Take 12.5 mg by mouth daily. 02/11/21   Sherren Mocha, MD  cetirizine (ZYRTEC) 10 MG tablet Take 10 mg by mouth daily.    [provider]  escitalopram (LEXAPRO) 10 MG tablet Take 1 tablet (10 mg total) by mouth daily. Patient taking differently: Take 10 mg by mouth every morning. 03/10/20   Angiulli, Lavon Paganini, PA-C  ferrous sulfate 324 MG TBEC Take 324 mg by mouth every morning.    [provider]  FOLIC ACID PO Take 1 tablet by mouth every morning.    [provider]  Magnesium Oxide 400 MG CAPS Take 1 capsule (400 mg total) by mouth daily. 10/17/20   Eugenie Filler, MD  Multiple Vitamin (MULTIVITAMIN PO) Take 1 tablet by mouth every other day.    [provider]  nitroGLYCERIN (NITROSTAT) 0.4 MG SL tablet Place 1 tablet (0.4 mg total) under the tongue every 5 (five) minutes x 3 doses as needed for chest pain. 07/28/20   Baldwin Jamaica, PA-C  pantoprazole (PROTONIX) 40 MG tablet Take 1 tablet (40 mg total) by mouth daily. Patient taking differently: Take 40 mg by mouth every morning. 03/10/20   Angiulli, Lavon Paganini, PA-C  sodium bicarbonate 650 MG tablet Take 650 mg  by mouth daily.    [provider]  tamsulosin (FLOMAX) 0.4 MG CAPS capsule Take 0.4 mg by mouth at bedtime.    [provider]  torsemide (DEMADEX) 20 MG tablet Take 60 mg by mouth daily.    [provider]  traZODone (DESYREL) 50 MG tablet Take 1 tablet (50 mg total) by mouth at bedtime. 11/26/21   Alma Friendly, MD      Allergies    Penicillin g sodium and Penicillins    Review of Systems   Review of Systems  Neurological:  Positive for weakness.    Physical Exam Updated Vital Signs BP (!) 125/57   Pulse 68   Temp 98.7 F (37.1  C) (Oral)   Resp 18   Ht 6' (1.829 m)   Wt 85.3 kg   SpO2 97%   BMI 25.50 kg/m  Physical Exam Vitals and nursing note reviewed.  Constitutional:      General: He is not in acute distress.    Appearance: Normal appearance. He is well-developed.     Comments: Alert, oriented, conversational.  HENT:     Head: Normocephalic and atraumatic.     Right Ear: External ear normal.     Left Ear: External ear normal.     Nose: Nose normal.  Eyes:     Conjunctiva/sclera: Conjunctivae normal.  Cardiovascular:     Rate and Rhythm: Normal rate and regular rhythm.     Heart sounds: Murmur heard.  Pulmonary:     Effort: Pulmonary effort is normal. No respiratory distress.     Breath sounds: Wheezing present.     Comments: Wheezing heard in right lung field. Abdominal:     Palpations: Abdomen is soft.     Tenderness: There is no abdominal tenderness. There is no guarding.  Musculoskeletal:     Cervical back: Neck supple.     Right lower leg: No edema.     Left lower leg: No edema.     Comments: 2+ radial and DP pulses. 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  Skin:    General: Skin is warm and dry.  Neurological:     Mental Status: He is alert.     ED Results / Procedures / Treatments   Labs (all labs ordered are listed, but only abnormal results are displayed) Labs Reviewed - No data to display  EKG EKG Interpretation  Date/Time:  Sunday December 19 2021 16:12:39 EDT Ventricular Rate:  67 PR Interval:  190 QRS Duration: 116 QT Interval:  497 QTC Calculation: 525 R Axis:   -47 Text Interpretation: Sinus rhythm Nonspecific IVCD with LAD Inferior infarct, old Confirmed by Nanda Quinton [phone]) on 12/19/2021 4:14:24 PM  Radiology No results found.  Procedures Procedures   Medications Ordered in ED Medications - No data to display  ED Course/ Medical Decision Making/ A&P                           Medical Decision Making Amount and/or Complexity of Data Reviewed Labs:  ordered. Radiology: ordered.   Darryl Diaz is a 81 y.o. male PMH hypertension, hyperlipidemia, A-fib, alcoholic cirrhosis of the liver, CHF, ESRD, restless leg syndrome who is presenting with weakness.    Patient is hemodynamically stable, afebrile, satting well on room air.  Physical exam reassuring.  Normal heart sounds.  Lungs clear to auscultation bilaterally.  Abdomen is soft, nontender to palpation.  No pitting edema.  Initial  differential includes but is not limited to: Dehydration, electrolyte derangements, infection including pneumonia, UTI, hyperammonemia  Lab work obtained, resulted notable for multiple electrolyte derangements including sodium 130, chloride 92.  Creatinine 3.13, consistent with patient history of ESRD.  CBC without leukocytosis.  Hemoglobin 8.9, not significantly changed from prior.  Mag, Phos, ammonia normal.  UA pending.    Imaging was pursued, with no evidence of focal consolidation or pulmonary edema per radiology report.  EKG obtained, demonstrates sinus rhythm, ventricular rate 66 bpm.  No evidence of acute ischemia.  QTc prolonged, 537.  Interpreted by myself and my attending.  Interventions include IV LR.  Patient was ambulated around the room with his rolling walker.  However was only able to ambulate a few steps around the room before feeling significantly fatigued and asking to be sat back down.  He reports this is different from normal, and is concerned about going home tonight.  He lives alone, and does not have family it is available to stay with him.  I discussed the case with the hospitalist, however patient does not currently meet criteria for inpatient admission.  We will plan to reach out to social work and Physical and Occupational Therapy in the ED to see if they are able to evaluate him for potential placement and safe disposition.  The plan for this patient was discussed with Dr. Sabra Heck, who voiced agreement and who oversaw evaluation and  treatment of this patient.    Final Clinical Impression(s) / ED Diagnoses Final diagnoses:  Weakness    Rx / DC Orders ED Discharge Orders     None         Faylene Million, MD 12/19/21 2352    Noemi Chapel, MD 12/22/21 1445

## 2021-12-19 NOTE — ED Triage Notes (Signed)
Pt BIB [facility], seen Friday at [facility] for fall, now having generalized weakness and tremors this AM, none on arrival. 105/50. EMS endorses frequent falls. Given 223m NS. 20g R FA.

## 2021-12-19 NOTE — ED Notes (Signed)
Pt ambulatory with walker in room, able to ambulate appx 15 feet with walker. Pt states he feels improvement since this AM but feels he is not at his baseline. Pt also had a BM, pt cleaned, peri care completed, brief changed, linens changed. Pt resting comfortably at this time.

## 2021-12-20 DIAGNOSIS — R531 Weakness: Secondary | ICD-10-CM | POA: Diagnosis not present

## 2021-12-20 LAB — HEPATITIS C ANTIBODY: HCV Ab: NONREACTIVE

## 2021-12-20 LAB — RESP PANEL BY RT-PCR (FLU A&B, COVID) ARPGX2
Influenza A by PCR: NEGATIVE
Influenza B by PCR: NEGATIVE
SARS Coronavirus 2 by RT PCR: NEGATIVE

## 2021-12-20 LAB — HEPATITIS B SURFACE ANTIGEN: Hepatitis B Surface Ag: NONREACTIVE

## 2021-12-20 LAB — HEPATITIS B CORE ANTIBODY, TOTAL: Hep B Core Total Ab: NONREACTIVE

## 2021-12-20 LAB — HEPATITIS B SURFACE ANTIBODY,QUALITATIVE: Hep B S Ab: REACTIVE — AB

## 2021-12-20 MED ORDER — CHLORHEXIDINE GLUCONATE CLOTH 2 % EX PADS: 6.0000 | MEDICATED_PAD | Freq: Every day | CUTANEOUS | Status: DC

## 2021-12-20 NOTE — ED Notes (Signed)
Dinner tray delivered to pt at this time.  °

## 2021-12-20 NOTE — Progress Notes (Signed)
CSW just placed patient on 12/06/21. CSW was told by admissions at [facility] that patient left AMA.

## 2021-12-20 NOTE — Progress Notes (Signed)
CSW spoke with patient who agreed to go to [facility]. CSW spoke with patient about long term care and he stated he wanted to have someone come live in his home on the second floor and help him out when he gets discharged from SNF. Patient stated he is going to speak with his sister about getting that set up. CSW told patient to consider long term care and apply for medicaid. Patient stated he would think about it.

## 2021-12-20 NOTE — NC FL2 (Signed)
[place] LEVEL OF CARE SCREENING TOOL     IDENTIFICATION  Patient Name: Darryl Diaz Birthdate: [birth year] Sex: male Admission Date (Current Location): 12/19/2021  [facility] and [place] Number:  Herbalist and Address:  [facility] [place], [place] [address], [place], [place] [address]      Provider Number: [id number]  Attending Physician Name and Address:  Default, Provider, MD  Relative Name and Phone Number:  Lenor Coffin, [phone]    Current Level of Care: Hospital Recommended Level of Care: [place] Prior Approval Number:    Date Approved/Denied:   PASRR Number: [id number]  Discharge Plan: SNF    Current Diagnoses: Patient Active Problem List   Diagnosis Date Noted   Generalized weakness 11/24/2021   Severe sepsis ([place]) 11/24/2021   Lactic acidosis 11/24/2021   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   Acute hyponatremia 09/27/2021   Incidental finding of COVID-19 virus infection 06/07/2021   Dependence on renal dialysis ([place]) 05/25/2021   Non-pressure chronic ulcer of other part of left foot with unspecified severity ([place]) 05/25/2021   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[place]) 05/25/2021   Osteomyelitis of great toe of left foot ([place])    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[place]) 04/13/2021   Cellulitis of right foot 01/17/2021   Normocytic anemia 01/17/2021   UTI (urinary tract infection) 01/17/2021   Acute GI bleeding 01/06/2021   Pancytopenia ([place]) 12/02/2020   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   Orthostatic hypotension 11/23/2020   Acute renal insufficiency 10/23/2020   Alcohol abuse 10/23/2020   Hypokalemia 10/23/2020   Hypomagnesemia 10/23/2020   Low back pain at multiple sites 10/23/2020   Malnutrition of mild degree Altamease [place]: 75% to less than 90% of standard weight) ([place]) 10/23/2020   Open wound of scalp without complication 11/02/9483   Oral  candidiasis 10/23/2020   Cellulitis of left foot    Cellulitis of toe of left foot 10/13/2020   Skin ulcer of left great toe ([place]) 10/13/2020   Chronic diastolic CHF (congestive heart failure) ([place]) 10/13/2020   Transient weakness of left lower extremity 09/07/2020   Abnormality of gait 04/27/2020   Salmonella food poisoning 46/27/0350   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[place]) 03/17/2020   Yeast infection 03/16/2020   Diarrhea 03/16/2020   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 (HCC)    Allergies    Anemia, chronic disease    History of hypertension    Debility    Syncope and collapse 02/22/2020   Weakness 02/06/2020   History of repair of hip joint 10/15/2019   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   Pain in right foot 06/27/2019   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   Bradycardia 04/20/2019   Gastrointestinal hemorrhage 02/25/2019   Fall at home, initial encounter 02/25/2019   Bilateral lower extremity edema 02/25/2019   Chronic hyponatremia 02/25/2019   Fall 02/25/2019   Benign prostatic hyperplasia with lower urinary tract symptoms 12/19/2018   Hardening of the aorta (main artery of the heart) ([place]) 08/11/2018   Benign neoplasm of colon 06/18/2018   History of adenomatous polyp of colon 08/10/2017   Impaired fasting glucose 06/10/2016   Encounter for general adult medical examination without abnormal findings 06/03/2016   Hip fracture ([place]) 03/30/2016   Hypertension 03/30/2016   Hyperlipidemia 03/30/2016   Osteoarthritis 03/30/2016   Depressive disorder 03/30/2016   Rhabdomyolysis 03/30/2016   Leukocytosis 03/30/2016   Pressure ulcer 03/30/2016   GERD (gastroesophageal  reflux disease) 12/17/2015   Paroxysmal atrial fibrillation ([place]) 12/17/2015   Long term (current) use of anticoagulants 12/17/2015   Pure hypercholesterolemia 12/17/2015   Seasonal allergic rhinitis 12/17/2015   CAD (coronary artery disease) 08/21/2015    Orientation  RESPIRATION BLADDER Height & Weight     Self, Time, Situation, Place  Normal Continent Weight: 188 lb (85.3 kg) Height:  6' (182.9 cm)  BEHAVIORAL SYMPTOMS/MOOD NEUROLOGICAL BOWEL NUTRITION STATUS      Continent Diet (Regular)  AMBULATORY STATUS COMMUNICATION OF NEEDS Skin   Extensive Assist Verbally Normal                       Personal Care Assistance Level of Assistance  Bathing, Feeding, Dressing Bathing Assistance: Limited assistance Feeding assistance: Independent Dressing Assistance: Limited assistance     Functional Limitations Info  Sight, Hearing, Speech Sight Info: Impaired (wears glasses) Hearing Info: Adequate Speech Info: Adequate    SPECIAL CARE FACTORS FREQUENCY                       Contractures Contractures Info: Not present    Additional Factors Info  Code Status, Allergies Code Status Info: Prior DNR on 11/26/21 Allergies Info: Penicillin G Sodium, Penicillins           Current Medications (12/20/2021):  This is the current hospital active medication list Current Facility-Administered Medications  Medication Dose Route Frequency Provider Last Rate Last Admin   amiodarone (PACERONE) tablet 200 mg  200 mg Oral q morning Faylene Million, MD   200 mg at 12/20/21 1003   atorvastatin (LIPITOR) tablet 20 mg  20 mg Oral Daily Faylene Million, MD   20 mg at 12/20/21 1002   carvedilol (COREG) tablet 12.5 mg  12.5 mg Oral Daily Faylene Million, MD   12.5 mg at 12/20/21 1002   Chlorhexidine Gluconate Cloth 2 % PADS 6 each  6 each Topical Q0600 Gean Quint, MD       escitalopram (LEXAPRO) tablet 10 mg  10 mg Oral Daily Faylene Million, MD   10 mg at 12/20/21 1003   ferrous sulfate tablet 324 mg  324 mg Oral Q breakfast Faylene Million, MD   324 mg at 12/20/21 1002   pantoprazole (PROTONIX) EC tablet 40 mg  40 mg Oral Daily Faylene Million, MD   40 mg at 12/20/21 1003   tamsulosin (FLOMAX) capsule 0.4 mg  0.4 mg Oral QHS Faylene Million, MD   0.4 mg  at 12/19/21 2350   torsemide (DEMADEX) tablet 60 mg  60 mg Oral Daily Faylene Million, MD   60 mg at 12/20/21 1003   traZODone (DESYREL) tablet 50 mg  50 mg Oral QHS Faylene Million, MD   50 mg at 12/19/21 2350   Current Outpatient Medications  Medication Sig Dispense Refill   amiodarone (PACERONE) 200 MG tablet TAKE 1 TABLET BY MOUTH ONCE DAILY (Patient taking differently: Take 200 mg by mouth every morning.) 90 tablet 2   atorvastatin (LIPITOR) 20 MG tablet Take 1 tablet (20 mg total) by mouth daily. 30 tablet 0   carvedilol (COREG) 6.25 MG tablet Take 1 tablet (6.25 mg total) by mouth 2 (two) times daily. (Patient taking differently: Take 12.5 mg by mouth daily.) 180 tablet 3   cetirizine (ZYRTEC) 10 MG tablet Take 10 mg by mouth daily.     escitalopram (LEXAPRO) 10 MG tablet Take 1 tablet (10 mg total) by mouth daily. (Patient taking differently: Take 10  mg by mouth every morning.) 30 tablet 0   ferrous sulfate 324 MG TBEC Take 324 mg by mouth every morning.     FOLIC ACID PO Take 1 tablet by mouth every morning.     Magnesium Oxide 400 MG CAPS Take 1 capsule (400 mg total) by mouth daily.     Multiple Vitamin (MULTIVITAMIN PO) Take 1 tablet by mouth every other day.     pantoprazole (PROTONIX) 40 MG tablet Take 1 tablet (40 mg total) by mouth daily. (Patient taking differently: Take 40 mg by mouth every morning.) 30 tablet 0   sodium bicarbonate 650 MG tablet Take 650 mg by mouth daily.     tamsulosin (FLOMAX) 0.4 MG CAPS capsule Take 0.4 mg by mouth at bedtime.     torsemide (DEMADEX) 20 MG tablet Take 60 mg by mouth daily.     traZODone (DESYREL) 50 MG tablet Take 1 tablet (50 mg total) by mouth at bedtime. 30 tablet 0   nitroGLYCERIN (NITROSTAT) 0.4 MG SL tablet Place 1 tablet (0.4 mg total) under the tongue every 5 (five) minutes x 3 doses as needed for chest pain. 30 tablet 12     Discharge Medications: Please see discharge summary for a list of discharge medications.  Relevant  Imaging Results:  Relevant Lab Results:   Additional Information SSN: [id number]. Covid vaccinated x3. Requires dialysis outpatient transport at [facility] on [place], [place]

## 2021-12-20 NOTE — ED Provider Notes (Addendum)
Emergency Medicine Observation Re-evaluation Note  Darryl Diaz is a 81 y.o. male, seen on rounds today.  Pt initially presented to the ED for complaints of Weakness Currently, the patient is awaiting placement.  Physical Exam  BP 124/64   Pulse 66   Temp 98.2 F (36.8 C) (Oral)   Resp 16   Ht 6' (1.829 m)   Wt 85.3 kg   SpO2 98%   BMI 25.50 kg/m  Physical Exam General: Calm Cardiac: Well perfused.  Lungs: Even respirations.  Psych: Calm  ED Course / MDM  EKG:EKG Interpretation  Date/Time:  Sunday December 19 2021 17:00:27 EDT Ventricular Rate:  66 PR Interval:  180 QRS Duration: 121 QT Interval:  512 QTC Calculation: 537 R Axis:   -42 Text Interpretation: Sinus rhythm Nonspecific IVCD with LAD Confirmed by Noemi Chapel [id number]) on 12/19/2021 5:16:42 PM  I have reviewed the labs performed to date as well as medications administered while in observation.  Recent changes in the last 24 hours include patient pending placement.  08:00 AM  Patient requires (MWF) HD.  Spoke with Dr. Candiss Norse with Nephrology who will arrange HD while in the ED.   03:15 PM  Patient accepted to rehab center. They can admit tomorrow (12/21/21). Going for routine HD today.   Plan  Current plan is for placement.  Darryl Diaz is not under involuntary commitment.     Margette Fast, MD 12/20/21 1275    Margette Fast, MD 12/20/21 [phone]

## 2021-12-20 NOTE — ED Notes (Signed)
PT at bedside assessing pt at this time.  ?

## 2021-12-20 NOTE — Evaluation (Signed)
Physical Therapy Evaluation Patient Details Name: Darryl Diaz MRN: [id number] DOB: 29-Sep-1940 Today's Date: 12/20/2021  History of Present Illness  P is an 81 y/o male presenting 8/13 with weakness, recent falls.  Work up continues.  PMHx HTN, HLD, afib, alcoholic cirrhosis of the liver, CHF, ESRD, RLS  Clinical Impression  Pt admitted with/for general weakness, falls, changes in safety at home  Pt not quite his baseline, but is needing at least min guard for mobility with light min assist on occasion due to R>L LE incoordination/weakness..  Pt currently limited functionally due to the problems listed. ( See problems list.)   Pt will benefit from PT to maximize function and safety in order to get ready for next venue listed below.        Recommendations for follow up therapy are one component of a multi-disciplinary discharge planning process, led by the attending physician.  Recommendations may be updated based on patient status, additional functional criteria and insurance authorization.  Follow Up Recommendations Skilled nursing-short term rehab (<3 hours/day) Can patient physically be transported by private vehicle: Yes    Assistance Recommended at Discharge Frequent or constant Supervision/Assistance  Patient can return home with the following  A little help with walking and/or transfers;A little help with bathing/dressing/bathroom;Assistance with cooking/housework;Assist for transportation    Equipment Recommendations None recommended by PT  Recommendations for Other Services       Functional Status Assessment Patient has had a recent decline in their functional status and demonstrates the ability to make significant improvements in function in a reasonable and predictable amount of time.     Precautions / Restrictions Precautions Precautions: Fall      Mobility  Bed Mobility Overal bed mobility: Needs Assistance       Supine to sit: Supervision, HOB elevated Sit  to supine: Supervision   General bed mobility comments: slow to transition.  surpervision for safety    Transfers Overall transfer level: Needs assistance Equipment used: Rolling walker (2 wheels) Transfers: Sit to/from Stand Sit to Stand: Min guard           General transfer comment: dependent on surface not even supervision safely    Ambulation/Gait Ambulation/Gait assistance: Min guard, Min assist Gait Distance (Feet): 25 Feet (x2) Assistive device: Rolling walker (2 wheels) Gait Pattern/deviations: Step-through pattern   Gait velocity interpretation: <1.31 ft/sec, indicative of household ambulator   General Gait Details: generally unsteady in the RW, R LE especially uncoordinated hitting R rear RW legs, trouble keeping legs in the RW with confined RW movement/turns.  Stairs            Wheelchair Mobility    Modified Rankin (Stroke Patients Only)       Balance Overall balance assessment: Needs assistance Sitting-balance support: Feet supported Sitting balance-Leahy Scale: Fair (to good)     Standing balance support: Single extremity supported, Bilateral upper extremity supported, During functional activity Standing balance-Leahy Scale: Poor Standing balance comment: reliant on AD                             Pertinent Vitals/Pain Pain Assessment Pain Assessment: Faces Pain Score: 0-No pain Faces Pain Scale: No hurt Pain Intervention(s): Monitored during session    Home Living Family/patient expects to be discharged to:: Private residence Living Arrangements: Alone Available Help at Discharge: Family;Friend(s);Available PRN/intermittently Type of Home: Other(Comment) (townhome) Home Access: Level entry       Home Layout: One level  Home Equipment: Conservation officer, nature (2 wheels);Rollator (4 wheels);Shower seat;Grab bars - tub/shower Additional Comments: pt reports has a service to help with groceries and running errands, this service needs  a day's notice.    Prior Function Prior Level of Function : History of Falls (last six months);Needs assist             Mobility Comments: Uses rollator for mobility ADLs Comments: [employer] keepers assists with transportation to store. Has transportation to medical appointments and dialysis. Independent with ADLS does his own cooking     Hand Dominance   Dominant Hand: Right    Extremity/Trunk Assessment   Upper Extremity Assessment Upper Extremity Assessment: Overall WFL for tasks assessed;Generalized weakness    Lower Extremity Assessment Lower Extremity Assessment: Generalized weakness;RLE deficits/detail RLE Deficits / Details: incoordination and general weakness proximally    Cervical / Trunk Assessment Cervical / Trunk Assessment: Normal  Communication   Communication: No difficulties  Cognition Arousal/Alertness: Awake/alert Behavior During Therapy: WFL for tasks assessed/performed Overall Cognitive Status: No family/caregiver present to determine baseline cognitive functioning                                          General Comments General comments (skin integrity, edema, etc.): Pt tolerated session well with some fatigue, but definitely unsteady with use of RW overall.  Likely not safe living alone at times.    Exercises     Assessment/Plan    PT Assessment Patient needs continued PT services  PT Problem List Decreased strength;Decreased activity tolerance;Decreased balance;Decreased mobility;Decreased coordination;Decreased knowledge of use of DME       PT Treatment Interventions Gait training;DME instruction;Functional mobility training;Therapeutic activities;Balance training;Patient/family education    PT Goals (Current goals can be found in the Care Plan section)  Acute Rehab PT Goals Patient Stated Goal: I need to find another place to live that offers assistance. PT Goal Formulation: With patient Time For Goal Achievement:  01/03/22 Potential to Achieve Goals: Good    Frequency Min 3X/week     Co-evaluation               AM-PAC PT "6 Clicks" Mobility  Outcome Measure Help needed turning from your back to your side while in a flat bed without using bedrails?: None Help needed moving from lying on your back to sitting on the side of a flat bed without using bedrails?: A Little Help needed moving to and from a bed to a chair (including a wheelchair)?: A Little Help needed standing up from a chair using your arms (e.g., wheelchair or bedside chair)?: A Little Help needed to walk in hospital room?: A Little Help needed climbing 3-5 steps with a railing? : A Lot 6 Click Score: 18    End of Session   Activity Tolerance: Patient tolerated treatment well Patient left: in bed;with call bell/phone within reach;Other (comment) Nurse Communication: Mobility status PT Visit Diagnosis: Unsteadiness on feet (R26.81);Muscle weakness (generalized) (M62.81);History of falling (Z91.81)    Time: 6712-4580 PT Time Calculation (min) (ACUTE ONLY): 37 min   Charges:   PT Evaluation $PT Eval Moderate Complexity: 1 Mod PT Treatments $Gait Training: 8-22 mins        12/20/2021  Ginger Carne., PT Acute Rehabilitation Services [phone]  (pager) [phone]  (office)  Tessie Fass Erienne Spelman 12/20/2021, 1:35 PM

## 2021-12-20 NOTE — Progress Notes (Signed)
CSW spoke with Lexine Baton with admissions at [facility] who is willing to take patient tomorrow for SNF placement under the [place].

## 2021-12-20 NOTE — ED Notes (Signed)
Patient to Dialysis.

## 2021-12-20 NOTE — Progress Notes (Addendum)
[facility] Brief Note  Darryl Diaz is an 81 year old male with ESRD on HD MWF at [facility]. PMH also HTN, pAFib, CHF. He had a fall at home last week and was seen in the [facility] ED. Workup was negative and he was released home. He continued to feel weak and presented to the [facility] ED this am with difficulty walking. He is currently boarding in the ED, waiting for placement. Nephrology asked to see for dialysis needs.   VSS. Labs acceptable for ESRD.  No complaints this am.  Denies cp, dyspnea, nausea/vomiting.   Alert, nad Lungs clear, nob  RRR soft sm Abs soft non tender No sig LE edema LUA AVF +bruit    Outpatient HD: AF MWF 4h 400/A1.5x 2/2 AVF No heparin -Mircera 30 q 2 wks (last 8/7) -Venofer 100 x 10 (until 8/18) -No BMM orders   HD MWF. Continue on schedule. HD today    Lynnda Child PA-C 12/20/2021 11:00 AM

## 2021-12-21 ENCOUNTER — Encounter (HOSPITAL_COMMUNITY): Payer: Self-pay | Admitting: Emergency Medicine

## 2021-12-21 DIAGNOSIS — R531 Weakness: Secondary | ICD-10-CM | POA: Diagnosis not present

## 2021-12-21 DIAGNOSIS — Z7401 Bed confinement status: Secondary | ICD-10-CM | POA: Diagnosis not present

## 2021-12-21 DIAGNOSIS — I959 Hypotension, unspecified: Secondary | ICD-10-CM | POA: Diagnosis not present

## 2021-12-21 NOTE — ED Notes (Signed)
Attempted report to [facility]

## 2021-12-21 NOTE — Progress Notes (Signed)
Patient is being discharge to [facility]. Patient is going to room 513, number for report is [phone]. Information provided to patients nurse.

## 2021-12-21 NOTE — ED Notes (Signed)
Patient back from dialysis.

## 2021-12-21 NOTE — ED Notes (Signed)
PTAR called at 10:39 - eta "one to two hours"

## 2021-12-21 NOTE — Progress Notes (Signed)
Received patient in stretcher to unit.  Alert and oriented.  Informed consent signed and in  chart.   Treatment initiated: 2310H Treatment completed: 0145H  Patient tolerated well. But Pt wanted off the machine with 1.33H left. Pt signed the early treatment off paper.  Transported back to ED alert, without acute distress.  Hand-off given to patient's nurse.   Access used: left AVF Access issues: none  Total UF removed: 1665m Medication(s) given: None Post HD VS: BP 158/69mg, HR 62 bpm, RR 15brpm, Sats 99% on RA Post HD weight: 83.7kg   [facility]

## 2021-12-21 NOTE — Chronic Care Management (AMB) (Signed)
  Care Coordination  Outreach Note  12/21/2021 Name: COMER DEVINS MRN: [id number] DOB: 03/11/1941   Care Coordination Outreach Attempts  A second unsuccessful outreach was attempted today to offer the patient with information about available care coordination services as a benefit of their health plan.     Follow Up Plan:  Additional outreach attempts will be made to offer the patient care coordination information and services.   Encounter Outcome:  No Answer  Julian Hy, [place] Direct Dial: [phone]

## 2021-12-21 NOTE — ED Notes (Signed)
Report given to [facility] at [facility]

## 2021-12-21 NOTE — Progress Notes (Signed)
PT Cancellation Note  Patient Details Name: Darryl Diaz MRN: [id number] DOB: 05/22/1940   Cancelled Treatment:    Reason Eval/Treat Not Completed: Other (comment).  Leaving for SNF today as PT arrives.   Ramond Dial 12/21/2021, 11:13 AM  Mee Hives, PT PhD Acute Rehab Dept. Number: [place]

## 2021-12-21 NOTE — ED Provider Notes (Signed)
  Physical Exam  BP 109/64   Pulse 61   Temp 98.6 F (37 C) (Oral)   Resp 17   Ht 6' (1.829 m)   Wt 83.7 kg   SpO2 96%   BMI 25.03 kg/m   Physical Exam Constitutional:      Appearance: Normal appearance.  Pulmonary:     Effort: Pulmonary effort is normal.  Abdominal:     General: Abdomen is flat.  Neurological:     Mental Status: He is alert.     Comments: Normal speech and language, moving all extremities     Procedures  Procedures  ED Course / MDM    Medical Decision Making Amount and/or Complexity of Data Reviewed Labs: ordered. Radiology: ordered.  Risk OTC drugs. Prescription drug management.   Patient going for placement at [facility].  He is happy with this plan and safe for discharge.  Creatinine elevated but is consistent with baseline and patient is known to be on dialysis.  He will be living across from his dialysis center.  He is neurologically intact.  Safe for discharge.       Elgie Congo, MD 12/21/21 [phone]

## 2021-12-21 NOTE — Discharge Instructions (Addendum)
You were seen in the emergency department for weakness.  Overall your physical exam and work-up was reassuring and you are safe to be discharged back to [facility].  Come back if any new or severe concerning problems such as chest pain, shortness of breath, or any other symptoms concerning to you.

## 2021-12-22 DIAGNOSIS — Z992 Dependence on renal dialysis: Secondary | ICD-10-CM | POA: Diagnosis not present

## 2021-12-22 DIAGNOSIS — D509 Iron deficiency anemia, unspecified: Secondary | ICD-10-CM | POA: Diagnosis not present

## 2021-12-22 DIAGNOSIS — D631 Anemia in chronic kidney disease: Secondary | ICD-10-CM | POA: Diagnosis not present

## 2021-12-22 DIAGNOSIS — N186 End stage renal disease: Secondary | ICD-10-CM | POA: Diagnosis not present

## 2021-12-22 DIAGNOSIS — N2581 Secondary hyperparathyroidism of renal origin: Secondary | ICD-10-CM | POA: Diagnosis not present

## 2021-12-22 LAB — HEPATITIS B SURFACE ANTIBODY, QUANTITATIVE: Hep B S AB Quant (Post): 23.7 m[IU]/mL (ref >9.9)

## 2021-12-23 DIAGNOSIS — I5032 Chronic diastolic (congestive) heart failure: Secondary | ICD-10-CM | POA: Diagnosis not present

## 2021-12-23 DIAGNOSIS — G4733 Obstructive sleep apnea (adult) (pediatric): Secondary | ICD-10-CM | POA: Diagnosis not present

## 2021-12-23 DIAGNOSIS — I480 Paroxysmal atrial fibrillation: Secondary | ICD-10-CM | POA: Diagnosis not present

## 2021-12-23 DIAGNOSIS — G4700 Insomnia, unspecified: Secondary | ICD-10-CM | POA: Diagnosis not present

## 2021-12-23 DIAGNOSIS — D631 Anemia in chronic kidney disease: Secondary | ICD-10-CM | POA: Diagnosis not present

## 2021-12-23 DIAGNOSIS — K7030 Alcoholic cirrhosis of liver without ascites: Secondary | ICD-10-CM | POA: Diagnosis not present

## 2021-12-23 DIAGNOSIS — I120 Hypertensive chronic kidney disease with stage 5 chronic kidney disease or end stage renal disease: Secondary | ICD-10-CM | POA: Diagnosis not present

## 2021-12-23 DIAGNOSIS — F411 Generalized anxiety disorder: Secondary | ICD-10-CM | POA: Diagnosis not present

## 2021-12-23 DIAGNOSIS — K219 Gastro-esophageal reflux disease without esophagitis: Secondary | ICD-10-CM | POA: Diagnosis not present

## 2021-12-23 DIAGNOSIS — Z9181 History of falling: Secondary | ICD-10-CM | POA: Diagnosis not present

## 2021-12-23 DIAGNOSIS — N186 End stage renal disease: Secondary | ICD-10-CM | POA: Diagnosis not present

## 2021-12-24 DIAGNOSIS — N2581 Secondary hyperparathyroidism of renal origin: Secondary | ICD-10-CM | POA: Diagnosis not present

## 2021-12-24 DIAGNOSIS — I480 Paroxysmal atrial fibrillation: Secondary | ICD-10-CM | POA: Diagnosis not present

## 2021-12-24 DIAGNOSIS — D631 Anemia in chronic kidney disease: Secondary | ICD-10-CM | POA: Diagnosis not present

## 2021-12-24 DIAGNOSIS — I120 Hypertensive chronic kidney disease with stage 5 chronic kidney disease or end stage renal disease: Secondary | ICD-10-CM | POA: Diagnosis not present

## 2021-12-24 DIAGNOSIS — Z992 Dependence on renal dialysis: Secondary | ICD-10-CM | POA: Diagnosis not present

## 2021-12-24 DIAGNOSIS — D509 Iron deficiency anemia, unspecified: Secondary | ICD-10-CM | POA: Diagnosis not present

## 2021-12-24 DIAGNOSIS — N186 End stage renal disease: Secondary | ICD-10-CM | POA: Diagnosis not present

## 2021-12-24 DIAGNOSIS — M6281 Muscle weakness (generalized): Secondary | ICD-10-CM | POA: Diagnosis not present

## 2021-12-27 DIAGNOSIS — K7030 Alcoholic cirrhosis of liver without ascites: Secondary | ICD-10-CM | POA: Diagnosis not present

## 2021-12-27 DIAGNOSIS — I5032 Chronic diastolic (congestive) heart failure: Secondary | ICD-10-CM | POA: Diagnosis not present

## 2021-12-27 DIAGNOSIS — Z992 Dependence on renal dialysis: Secondary | ICD-10-CM | POA: Diagnosis not present

## 2021-12-27 DIAGNOSIS — D631 Anemia in chronic kidney disease: Secondary | ICD-10-CM | POA: Diagnosis not present

## 2021-12-27 DIAGNOSIS — G4733 Obstructive sleep apnea (adult) (pediatric): Secondary | ICD-10-CM | POA: Diagnosis not present

## 2021-12-27 DIAGNOSIS — N2581 Secondary hyperparathyroidism of renal origin: Secondary | ICD-10-CM | POA: Diagnosis not present

## 2021-12-27 DIAGNOSIS — Z9181 History of falling: Secondary | ICD-10-CM | POA: Diagnosis not present

## 2021-12-27 DIAGNOSIS — I480 Paroxysmal atrial fibrillation: Secondary | ICD-10-CM | POA: Diagnosis not present

## 2021-12-27 DIAGNOSIS — N186 End stage renal disease: Secondary | ICD-10-CM | POA: Diagnosis not present

## 2021-12-27 DIAGNOSIS — I120 Hypertensive chronic kidney disease with stage 5 chronic kidney disease or end stage renal disease: Secondary | ICD-10-CM | POA: Diagnosis not present

## 2021-12-27 DIAGNOSIS — D509 Iron deficiency anemia, unspecified: Secondary | ICD-10-CM | POA: Diagnosis not present

## 2021-12-28 NOTE — Chronic Care Management (AMB) (Signed)
  Care Coordination  Outreach Note  12/28/2021 Name: Darryl Diaz MRN: [id number] DOB: [birth year]   Care Coordination Outreach Attempts  A third unsuccessful outreach was attempted today to offer the patient with information about available care coordination services as a benefit of their health plan.   Received referral   Follow Up Plan:  No further outreach attempts will be made at this time. We have been unable to contact the patient to offer or enroll patient in care coordination services  Encounter Outcome:  No Answer  Julian Hy, [place] Direct Dial: [phone]

## 2021-12-29 ENCOUNTER — Other Ambulatory Visit: Payer: Self-pay | Admitting: *Deleted

## 2021-12-29 DIAGNOSIS — N2581 Secondary hyperparathyroidism of renal origin: Secondary | ICD-10-CM | POA: Diagnosis not present

## 2021-12-29 DIAGNOSIS — Z992 Dependence on renal dialysis: Secondary | ICD-10-CM | POA: Diagnosis not present

## 2021-12-29 DIAGNOSIS — D509 Iron deficiency anemia, unspecified: Secondary | ICD-10-CM | POA: Diagnosis not present

## 2021-12-29 DIAGNOSIS — N186 End stage renal disease: Secondary | ICD-10-CM | POA: Diagnosis not present

## 2021-12-29 DIAGNOSIS — D631 Anemia in chronic kidney disease: Secondary | ICD-10-CM | POA: Diagnosis not present

## 2021-12-29 NOTE — Patient Outreach (Signed)
Mr. Titus admitted to [facility] on 12/21/21. [facility] care coordination team has tried multiple times to reach Mr. Bilal without success.   Facility site visit to [facility]. Met with Kristy, SNF SW who reports Mr. Randa is returning home on Friday, 12/31/21. Lives alone. However, Kristy states Mr. Pask is looking into paying privately for someone to live with him for assistance. Mr. Massaro will have [facility] for PT/OT/RN services.   Attempted to visit Mr. Weld at bedside to discuss transition plans and [facility] follow up.  However Mr. Dresner was off the unit in HD. Mr. Seydel goes to HD on Mondays, Wednesdays, Fridays. Writer will make another attempt, on Thursday, to visit Mr. Ditton.    , MSN, RN,BSN [facility] Post Acute Care Coordinator [phone] ( [employer] Mobile) [phone]  (Toll free office)  

## 2021-12-30 ENCOUNTER — Other Ambulatory Visit: Payer: Self-pay | Admitting: *Deleted

## 2021-12-30 DIAGNOSIS — I120 Hypertensive chronic kidney disease with stage 5 chronic kidney disease or end stage renal disease: Secondary | ICD-10-CM | POA: Diagnosis not present

## 2021-12-30 DIAGNOSIS — N186 End stage renal disease: Secondary | ICD-10-CM | POA: Diagnosis not present

## 2021-12-30 DIAGNOSIS — G4733 Obstructive sleep apnea (adult) (pediatric): Secondary | ICD-10-CM | POA: Diagnosis not present

## 2021-12-30 DIAGNOSIS — K7030 Alcoholic cirrhosis of liver without ascites: Secondary | ICD-10-CM | POA: Diagnosis not present

## 2021-12-30 DIAGNOSIS — D631 Anemia in chronic kidney disease: Secondary | ICD-10-CM | POA: Diagnosis not present

## 2021-12-30 DIAGNOSIS — I5032 Chronic diastolic (congestive) heart failure: Secondary | ICD-10-CM | POA: Diagnosis not present

## 2021-12-30 DIAGNOSIS — Z9181 History of falling: Secondary | ICD-10-CM | POA: Diagnosis not present

## 2021-12-30 DIAGNOSIS — I480 Paroxysmal atrial fibrillation: Secondary | ICD-10-CM | POA: Diagnosis not present

## 2021-12-30 NOTE — Patient Outreach (Signed)
[place] Coordinator follow up.   Made special visit to [facility] to speak with Mr. Abarca at bedside. However, he was out of the building.   Will attempt to reach Mr. Woodrick by phone to discuss [facility] follow up. Multiple outreach attempts have been made by [facility] care coordination team as well as Probation officer without success.   Mr. Burdin slated to dc back home on Friday, 12/31/21 with [facility] services.   [name], MSN, RN,BSN [place] Acute Care Coordinator [phone] [facility]) [phone]  (Toll free office)

## 2021-12-31 DIAGNOSIS — I480 Paroxysmal atrial fibrillation: Secondary | ICD-10-CM | POA: Diagnosis not present

## 2021-12-31 DIAGNOSIS — M6281 Muscle weakness (generalized): Secondary | ICD-10-CM | POA: Diagnosis not present

## 2021-12-31 DIAGNOSIS — D631 Anemia in chronic kidney disease: Secondary | ICD-10-CM | POA: Diagnosis not present

## 2021-12-31 DIAGNOSIS — N186 End stage renal disease: Secondary | ICD-10-CM | POA: Diagnosis not present

## 2021-12-31 DIAGNOSIS — Z992 Dependence on renal dialysis: Secondary | ICD-10-CM | POA: Diagnosis not present

## 2021-12-31 DIAGNOSIS — J309 Allergic rhinitis, unspecified: Secondary | ICD-10-CM | POA: Diagnosis not present

## 2021-12-31 DIAGNOSIS — N2581 Secondary hyperparathyroidism of renal origin: Secondary | ICD-10-CM | POA: Diagnosis not present

## 2021-12-31 DIAGNOSIS — D509 Iron deficiency anemia, unspecified: Secondary | ICD-10-CM | POA: Diagnosis not present

## 2022-01-03 ENCOUNTER — Emergency Department (HOSPITAL_COMMUNITY)
Admission: EM | Admit: 2022-01-03 | Discharge: 2022-01-04 | Disposition: A | Discharge status: Home / Self Care | Payer: Medicare Other | Attending: Emergency Medicine | Admitting: Emergency Medicine

## 2022-01-03 ENCOUNTER — Emergency Department (HOSPITAL_COMMUNITY): Payer: Medicare Other

## 2022-01-03 DIAGNOSIS — R531 Weakness: Secondary | ICD-10-CM | POA: Diagnosis not present

## 2022-01-03 DIAGNOSIS — R42 Dizziness and giddiness: Secondary | ICD-10-CM | POA: Diagnosis not present

## 2022-01-03 DIAGNOSIS — D509 Iron deficiency anemia, unspecified: Secondary | ICD-10-CM | POA: Diagnosis not present

## 2022-01-03 DIAGNOSIS — N2581 Secondary hyperparathyroidism of renal origin: Secondary | ICD-10-CM | POA: Diagnosis not present

## 2022-01-03 DIAGNOSIS — R519 Headache, unspecified: Secondary | ICD-10-CM | POA: Insufficient documentation

## 2022-01-03 DIAGNOSIS — Z992 Dependence on renal dialysis: Secondary | ICD-10-CM | POA: Diagnosis not present

## 2022-01-03 DIAGNOSIS — I132 Hypertensive heart and chronic kidney disease with heart failure and with stage 5 chronic kidney disease, or end stage renal disease: Secondary | ICD-10-CM | POA: Diagnosis not present

## 2022-01-03 DIAGNOSIS — Z96643 Presence of artificial hip joint, bilateral: Secondary | ICD-10-CM | POA: Insufficient documentation

## 2022-01-03 DIAGNOSIS — I5032 Chronic diastolic (congestive) heart failure: Secondary | ICD-10-CM | POA: Insufficient documentation

## 2022-01-03 DIAGNOSIS — D631 Anemia in chronic kidney disease: Secondary | ICD-10-CM | POA: Diagnosis not present

## 2022-01-03 DIAGNOSIS — S0990XA Unspecified injury of head, initial encounter: Secondary | ICD-10-CM | POA: Diagnosis not present

## 2022-01-03 DIAGNOSIS — Z79899 Other long term (current) drug therapy: Secondary | ICD-10-CM | POA: Diagnosis not present

## 2022-01-03 DIAGNOSIS — N186 End stage renal disease: Secondary | ICD-10-CM | POA: Insufficient documentation

## 2022-01-03 DIAGNOSIS — R55 Syncope and collapse: Secondary | ICD-10-CM | POA: Insufficient documentation

## 2022-01-03 DIAGNOSIS — G4489 Other headache syndrome: Secondary | ICD-10-CM | POA: Diagnosis not present

## 2022-01-03 DIAGNOSIS — W19XXXA Unspecified fall, initial encounter: Secondary | ICD-10-CM | POA: Diagnosis not present

## 2022-01-03 DIAGNOSIS — M542 Cervicalgia: Secondary | ICD-10-CM | POA: Diagnosis not present

## 2022-01-03 LAB — COMPREHENSIVE METABOLIC PANEL
ALT: 25 U/L (ref 0–44)
AST: 40 U/L (ref 15–41)
Albumin: 3.2 g/dL — ABNORMAL LOW (ref 3.5–5.0)
Alkaline Phosphatase: 118 U/L (ref 38–126)
Anion gap: 11 (ref 5–15)
BUN: 14 mg/dL (ref 8–23)
CO2: 29 mmol/L (ref 22–32)
Calcium: 8.3 mg/dL — ABNORMAL LOW (ref 8.9–10.3)
Chloride: 96 mmol/L — ABNORMAL LOW (ref 98–111)
Creatinine, Ser: 2.22 mg/dL — ABNORMAL HIGH (ref 0.61–1.24)
GFR, Estimated: 29 mL/min — ABNORMAL LOW (ref >60)
Glucose, Bld: 89 mg/dL (ref 70–99)
Potassium: 3.1 mmol/L — ABNORMAL LOW (ref 3.5–5.1)
Sodium: 136 mmol/L (ref 135–145)
Total Bilirubin: 1.5 mg/dL — ABNORMAL HIGH (ref 0.3–1.2)
Total Protein: 5.7 g/dL — ABNORMAL LOW (ref 6.5–8.1)

## 2022-01-03 LAB — CBC WITH DIFFERENTIAL/PLATELET
Abs Immature Granulocytes: 0.01 10*3/uL (ref 0.00–0.07)
Basophils Absolute: 0 10*3/uL (ref 0.0–0.1)
Basophils Relative: 1 %
Eosinophils Absolute: 0.1 10*3/uL (ref 0.0–0.5)
Eosinophils Relative: 2 %
HCT: 27.6 % — ABNORMAL LOW (ref 39.0–52.0)
Hemoglobin: 9.5 g/dL — ABNORMAL LOW (ref 13.0–17.0)
Immature Granulocytes: 0 %
Lymphocytes Relative: 21 %
Lymphs Abs: 0.8 10*3/uL (ref 0.7–4.0)
MCH: 35.4 pg — ABNORMAL HIGH (ref 26.0–34.0)
MCHC: 34.4 g/dL (ref 30.0–36.0)
MCV: 103 fL — ABNORMAL HIGH (ref 80.0–100.0)
Monocytes Absolute: 0.7 10*3/uL (ref 0.1–1.0)
Monocytes Relative: 18 %
Neutro Abs: 2.2 10*3/uL (ref 1.7–7.7)
Neutrophils Relative %: 58 %
Platelets: 148 10*3/uL — ABNORMAL LOW (ref 150–400)
RBC: 2.68 MIL/uL — ABNORMAL LOW (ref 4.22–5.81)
RDW: 15.2 % (ref 11.5–15.5)
WBC: 3.8 10*3/uL — ABNORMAL LOW (ref 4.0–10.5)
nRBC: 0 % (ref 0.0–0.2)

## 2022-01-03 LAB — CBG MONITORING, ED: Glucose-Capillary: 81 mg/dL (ref 70–99)

## 2022-01-03 MED ORDER — SODIUM CHLORIDE 0.9 % IV BOLUS
500.0000 mL | Freq: Once | INTRAVENOUS | Status: AC
  Administered 2022-01-03: 500 mL via INTRAVENOUS

## 2022-01-03 NOTE — ED Provider Notes (Signed)
[place] [facility] EMERGENCY DEPARTMENT Provider Note   CSN: [id number] Arrival date & time: 01/03/22  2053     History {Add pertinent medical, surgical, social history, OB history to HPI:1} Chief Complaint  Patient presents with   Loss of Consciousness    Darryl Diaz is a 81 y.o. male.  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 Pt reports he received full HD session today, he returned back home; after being dropped off he was ambulating in his living room with rolling walker and felt light headed, felt like he was having tunnel vision and he fell to the floor. Struck front of his head on the floor. No LOC. No thinners anymore. Was able to get up from the floor on his own with some difficultly. He denies HA, no dib or cp, no abd pain/n/v. He still makes some urine, denies changes to urine. He reports he feels much better after receiving some IVF by EMS. Compliant with home medications.    Similar presentation 8/9    Past Medical History:  Diagnosis Date   Alcohol abuse 93/81/8299   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[place]) 03/17/2020   Alcoholism ([place])    in remission following wife's death   Anemia    Anxiety    Ascending aorta dilation (HCC)    Atrial fibrillation (HCC)    Cardiac arrest ([place]) 04/20/2019   12 min CPR with epi   Chronic diastolic CHF (congestive heart failure) ([place])    Dysrhythmia    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[place])    Mon Wed Fri   Fatty liver    Fracture of hip ([place]) 03/30/2016   Gastritis    GERD (gastroesophageal reflux disease)    H/O seasonal allergies    Heme positive stool 05/14/2020   History of blood transfusion    History of GI bleed    Hx of adenomatous colonic polyps 08/10/2017   Hyperlipidemia    Hypertension    Insomnia    Major depressive disorder     following wife's death   Mallory-Weiss tear    OA (osteoarthritis)    hands   OSA (obstructive sleep apnea)    No longer uses CPAP   Persistent atrial fibrillation with rapid ventricular response ([place]) 02/05/2020   Posterior dislocation of hip, closed ([place]) 04/30/2016   Recurrent syncope    Restless legs syndrome (RLS)    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([place])     Past Surgical History:  Procedure Laterality Date   AMPUTATION Left 05/19/2021   Procedure: LEFT GREAT TOE AMPUTATION;  Surgeon: Newt Minion, MD;  Location: [place];  Service: Orthopedics;  Laterality: Left;   ANKLE SURGERY Left 2014   had rods put in    AV FISTULA PLACEMENT Left 04/21/2021   Procedure: LEFT ARM ARTERIOVENOUS (AV) FISTULA CREATION - Left brachiocephalic fistula;  Surgeon: Marty Heck, MD;  Location: [place];  Service: Vascular;  Laterality: Left;   BIOPSY  02/28/2019   Procedure: BIOPSY;  Surgeon: Milus Banister, MD;  Location: [facility];  Service: Endoscopy;;   CARDIOVERSION  02/06/2020   CARDIOVERSION N/A 02/06/2020   Procedure: CARDIOVERSION;  Surgeon: Werner Lean, MD;  Location: [place] [facility] ENDOSCOPY;  Service: Cardiovascular;  Laterality: N/A;   CARDIOVERSION N/A 02/24/2020   Procedure: CARDIOVERSION;  Surgeon: Werner Lean, MD;  Location: [place] [facility] [place];  Service: Cardiovascular;  Laterality: N/A;   COLONOSCOPY     [place]   COLONOSCOPY WITH PROPOFOL N/A 01/08/2021   Procedure: COLONOSCOPY WITH PROPOFOL;  Surgeon: Milus Banister, MD;  Location: [facility] ENDOSCOPY;  Service: Endoscopy;  Laterality: N/A;   ESOPHAGOGASTRODUODENOSCOPY (EGD) WITH PROPOFOL N/A 02/28/2019   Procedure: ESOPHAGOGASTRODUODENOSCOPY (EGD) WITH PROPOFOL;  Surgeon: Milus Banister, MD;  Location: [facility] ENDOSCOPY;  Service: Endoscopy;  Laterality: N/A;   FRACTURE SURGERY     HIP ARTHROPLASTY Left 04/01/2016   Procedure: ARTHROPLASTY BIPOLAR HIP (HEMIARTHROPLASTY);  Surgeon: Paralee Cancel, MD;  Location: [place];   Service: Orthopedics;  Laterality: Left;   HIP CLOSED REDUCTION Left 04/30/2016   Procedure: CLOSED REDUCTION HIP;  Surgeon: Wylene Simmer, MD;  Location: [place];  Service: Orthopedics;  Laterality: Left;   HOT HEMOSTASIS N/A 01/08/2021   Procedure: HOT HEMOSTASIS (ARGON PLASMA COAGULATION/BICAP);  Surgeon: Milus Banister, MD;  Location[facility];  Service: Endoscopy;  Laterality: N/A;   IR RADIOLOGIST EVAL & MGMT  12/31/2020   JOINT REPLACEMENT     TONSILLECTOMY     TOTAL HIP ARTHROPLASTY Right 10/15/2019   Procedure: TOTAL HIP ARTHROPLASTY ANTERIOR APPROACH;  Surgeon: Paralee Cancel, MD;  Location: [facility];  Service: Orthopedics;  Laterality: Right;  70 mins   VASCULAR SURGERY       The history is provided by the patient. No language interpreter was used.  Loss of Consciousness Associated symptoms: weakness   Associated symptoms: no chest pain, no confusion, no fever, no nausea, no palpitations, no shortness of breath and no vomiting        Home Medications Prior to Admission medications   Medication Sig Start Date End Date Taking? Authorizing Provider  amiodarone (PACERONE) 200 MG tablet TAKE 1 TABLET BY MOUTH ONCE DAILY Patient taking differently: Take 200 mg by mouth every morning. 05/17/21   Richardson Dopp T, PA-C  atorvastatin (LIPITOR) 20 MG tablet Take 1 tablet (20 mg total) by mouth daily. 03/10/20   Angiulli, Lavon Paganini, PA-C  carvedilol (COREG) 6.25 MG tablet Take 1 tablet (6.25 mg total) by mouth 2 (two) times daily. Patient taking differently: Take 12.5 mg by mouth daily. 02/11/21   Sherren Mocha, MD  cetirizine (ZYRTEC) 10 MG tablet Take 10 mg by mouth daily.    [provider]  escitalopram (LEXAPRO) 10 MG tablet Take 1 tablet (10 mg total) by mouth daily. Patient taking differently: Take 10 mg by mouth every morning. 03/10/20   Angiulli, Lavon Paganini, PA-C  ferrous sulfate 324 MG TBEC Take 324 mg by mouth every morning.    [provider]  FOLIC ACID PO Take  1 tablet by mouth every morning.    [provider]  Magnesium Oxide 400 MG CAPS Take 1 capsule (400 mg total) by mouth daily. 10/17/20   Eugenie Filler, MD  Multiple Vitamin (MULTIVITAMIN PO) Take 1 tablet by mouth every other day.    [provider]  nitroGLYCERIN (NITROSTAT) 0.4 MG SL tablet Place 1 tablet (0.4 mg total) under the tongue every 5 (five) minutes x 3 doses as needed for chest pain. 07/28/20   Baldwin Jamaica, PA-C  pantoprazole (PROTONIX) 40 MG tablet Take 1 tablet (40 mg total) by mouth daily. Patient taking differently: Take 40 mg by mouth every morning. 03/10/20   Angiulli, Lavon Paganini, PA-C  sodium bicarbonate 650 MG tablet Take 650 mg by mouth daily.    [provider]  tamsulosin (FLOMAX) 0.4  MG CAPS capsule Take 0.4 mg by mouth at bedtime.    [provider]  torsemide (DEMADEX) 20 MG tablet Take 60 mg by mouth daily.    [provider]  traZODone (DESYREL) 50 MG tablet Take 1 tablet (50 mg total) by mouth at bedtime. 11/26/21   Alma Friendly, MD      Allergies    Penicillin g sodium and Penicillins    Review of Systems   Review of Systems  Constitutional:  Negative for chills and fever.  HENT:  Negative for facial swelling and trouble swallowing.   Eyes:  Negative for photophobia and visual disturbance.  Respiratory:  Negative for cough and shortness of breath.   Cardiovascular:  Positive for syncope. Negative for chest pain and palpitations.  Gastrointestinal:  Negative for abdominal pain, diarrhea, nausea and vomiting.  Endocrine: Negative for polydipsia and polyuria.  Genitourinary:  Negative for difficulty urinating and hematuria.  Musculoskeletal:  Negative for gait problem and joint swelling.  Skin:  Negative for pallor and rash.  Neurological:  Positive for weakness and light-headedness. Negative for syncope and numbness.  Psychiatric/Behavioral:  Negative for agitation and confusion.     Physical  Exam Updated Vital Signs BP (!) 126/57   Pulse 64   Temp 98.6 F (37 C) (Oral)   Resp 17   SpO2 100%  Physical Exam Vitals and nursing note reviewed.  Constitutional:      General: He is not in acute distress.    Appearance: Normal appearance. He is well-developed. He is not ill-appearing or diaphoretic.  HENT:     Head: Normocephalic and atraumatic. No raccoon eyes, Battle's sign, right periorbital erythema or left periorbital erythema.     Jaw: There is normal jaw occlusion. No trismus.     Right Ear: External ear normal.     Left Ear: External ear normal.     Mouth/Throat:     Mouth: Mucous membranes are moist.  Eyes:     General: No scleral icterus.    Extraocular Movements: Extraocular movements intact.     Pupils: Pupils are equal, round, and reactive to light.  Cardiovascular:     Rate and Rhythm: Normal rate and regular rhythm.     Pulses: Normal pulses.     Heart sounds: Normal heart sounds.  Pulmonary:     Effort: Pulmonary effort is normal. No respiratory distress.     Breath sounds: Normal breath sounds.  Abdominal:     General: Abdomen is flat.     Palpations: Abdomen is soft.     Tenderness: There is no abdominal tenderness.  Musculoskeletal:        General: Normal range of motion.     Cervical back: Full passive range of motion without pain and normal range of motion.     Right lower leg: No edema.     Left lower leg: No edema.     Comments: No midline spinous process tenderness to palpation or percussion, no crepitus or step-off.    Pelvis stable to ap pressure  No sig discomfort with log roll b/l le   Skin:    General: Skin is warm and dry.     Capillary Refill: Capillary refill takes less than 2 seconds.       Neurological:     Mental Status: He is alert and oriented to person, place, and time.     GCS: GCS eye subscore is 4. GCS verbal subscore is 5. GCS motor subscore is 6.  Cranial Nerves: Cranial nerves 2-12 are intact.     Sensory:  Sensation is intact.     Motor: Motor function is intact. No tremor.     Coordination: Coordination is intact.     Comments: Gait not tested 2/2 patient safety   Psychiatric:        Mood and Affect: Mood normal.        Behavior: Behavior normal.     ED Results / Procedures / Treatments   Labs (all labs ordered are listed, but only abnormal results are displayed) Labs Reviewed  CBC WITH DIFFERENTIAL/PLATELET - Abnormal; Notable for the following components:      Result Value   WBC 3.8 (*)    RBC 2.68 (*)    Hemoglobin 9.5 (*)    HCT 27.6 (*)    MCV 103.0 (*)    MCH 35.4 (*)    Platelets 148 (*)    All other components within normal limits  COMPREHENSIVE METABOLIC PANEL  URINALYSIS, ROUTINE W REFLEX MICROSCOPIC  CBG MONITORING, ED    EKG None  Radiology DG Chest Portable 1 View  Result Date: 01/03/2022 CLINICAL DATA:  Fall syncope EXAM: PORTABLE CHEST 1 VIEW COMPARISON:  12/02/2021 FINDINGS: No acute airspace disease. Stable cardiomediastinal silhouette. No pneumothorax. Old appearing right-sided rib fractures. IMPRESSION: No active disease. Electronically Signed   By: Donavan Foil M.D.   On: 01/03/2022 21:59    Procedures Procedures  {Document cardiac monitor, telemetry assessment procedure when appropriate:1}  Medications Ordered in ED Medications  sodium chloride 0.9 % bolus 500 mL (500 mLs Intravenous New Bag/Given 01/03/22 2202)    ED Course/ Medical Decision Making/ A&P                           Medical Decision Making Amount and/or Complexity of Data Reviewed Labs: ordered. Radiology: ordered. ECG/medicine tests: ordered.   This patient presents to the ED with chief complaint(s) of fall w/ head injury with pertinent past medical history of esrd on hd, afib, chf which further complicates the presenting complaint. The complaint involves an extensive differential diagnosis and also carries with it a high risk of complications and morbidity.    Differential  diagnoses for head trauma includes subdural hematoma, epidural hematoma, acute concussion, traumatic subarachnoid hemorrhage, cerebral contusions, etc.  Serious etiologies were considered.   The initial plan is to screening labs, imaging, give 500 cc bolus ivf   Additional history obtained: Additional history obtained from  ems Records reviewed Primary Care Documents and prior ed visits, prior labs/imaging  Echo 9/22, LVEF 60-65%,  Independent labs interpretation:  The following labs were independently interpreted:  CBC comparable to his prior   Independent visualization of imaging: - I independently visualized the following imaging with scope of interpretation limited to determining acute life threatening conditions related to emergency care: CXR, CTH, CT cspine, which revealed ***  Cardiac monitoring was reviewed and interpreted by myself which shows NSR  Treatment and Reassessment: IVF  Consultation: - Consulted or discussed management/test interpretation w/ external professional: ***  Consideration for admission or further workup: Admission was considered ***  Social Determinants of health: Social History   Tobacco Use   Smoking status: Former    Packs/day: 1.00    Years: 20.00    Total pack years: 20.00    Types: Cigarettes   Smokeless tobacco: Never   Tobacco comments:       quit 20 years  smoked on and  off for 20 years  Vaping Use   Vaping Use: Never used  Substance Use Topics   Alcohol use: Not Currently    Alcohol/week: 3.0 standard drinks of alcohol    Types: 3 Glasses of wine per week    Comment: h/o heavy use   Drug use: Never      {Document critical care time when appropriate:1} {Document review of labs and clinical decision tools ie heart score, Chads2Vasc2 etc:1}  {Document your independent review of radiology images, and any outside records:1} {Document your discussion with family members, caretakers, and with consultants:1} {Document social  determinants of health affecting pt's care:1} {Document your decision making why or why not admission, treatments were needed:1} Final Clinical Impression(s) / ED Diagnoses Final diagnoses:  None    Rx / DC Orders ED Discharge Orders     None

## 2022-01-03 NOTE — ED Triage Notes (Signed)
Pt BIB EMS from home for a syncopal episode. PT was dialysis finishing the full treatment at 4pm today. Pt continued to feel weak  and lightheaded throughout the afternoon. Pt was ambulating through his house tonight when he had a syncopal episode and only remembers waking up on the ground. C/o HA after for 10 minutes but resolved. Still c/o Generalized weakness with lightheaded and weak   200cc [place] given PTA   110 SBP  To 90's and 80's SBP

## 2022-01-04 ENCOUNTER — Other Ambulatory Visit: Payer: Self-pay | Admitting: *Deleted

## 2022-01-04 DIAGNOSIS — Z7401 Bed confinement status: Secondary | ICD-10-CM | POA: Diagnosis not present

## 2022-01-04 DIAGNOSIS — R55 Syncope and collapse: Secondary | ICD-10-CM | POA: Diagnosis not present

## 2022-01-04 MED ORDER — POTASSIUM CHLORIDE CRYS ER 20 MEQ PO TBCR
20.0000 meq | EXTENDED_RELEASE_TABLET | Freq: Once | ORAL | Status: AC
  Administered 2022-01-04: 20 meq via ORAL
  Filled 2022-01-04: qty 1

## 2022-01-04 NOTE — Patient Outreach (Signed)
[facility] Post- Acute Care Coordinator follow up. Verified in [place] Mr. Daubert discharged from [facility].   Telephone call made to Mr. Christoper Fabian [phone]. Patient identifiers confirmed. Mr. Capozzi endorses he is now at home.  States he recently went to ED for a fall. States he is coming to grips with the possible need to move into an assisted living facility. States he plans to discuss with his family.   Discussed [facility] services. Mr. Hartland states he "does not have a lot of time to be on the phone. Not saying your program is not useful." Declines [facility] follow up. Discussed he has [facility] contact information from previous outreach attempts and literature for future reference.    [name], MSN, RN,BSN [facility] Coordinator [phone] [facility]) [phone]  (Toll free office)

## 2022-01-04 NOTE — ED Notes (Signed)
Patient ambulatory with walker and X1 staff assist

## 2022-01-04 NOTE — Discharge Instructions (Addendum)
It was a pleasure caring for you today in the emergency department.  Please return to the emergency department for any worsening or worrisome symptoms.  Have someone stay with you until you feel stable. Do not drive, operate machinery, or play sports until your caregiver says it is okay. Keep all follow-up appointments as directed by your caregiver. Lie down right away if you start feeling like you might faint. Breathe deeply and steadily. Wait until all the symptoms have passed.Drink enough fluids to keep your urine clear or pale yellow. If you are taking blood pressure or heart medicine, get up slowly, taking several minutes to sit and then stand. This can reduce dizziness. SEEK IMMEDIATE MEDICAL CARE IF: You have a severe headache. You have unusual pain in the chest, abdomen, or back. You are bleeding from the mouth or rectum, or you have a black or tarry stool. You have an irregular or very fast heartbeat. You have pain with breathing. You have repeated fainting or seizure-like jerking during an episode. You faint when sitting or lying down. You have confusion. You have difficulty walking. You have severe weakness. You have vision problems. If you fainted, call your local emergency services - do not drive yourself to the hospital.   Please return to the emergency department immediately for any new or concerning symptoms, or if you get worse.

## 2022-01-05 DIAGNOSIS — D631 Anemia in chronic kidney disease: Secondary | ICD-10-CM | POA: Diagnosis not present

## 2022-01-05 DIAGNOSIS — D509 Iron deficiency anemia, unspecified: Secondary | ICD-10-CM | POA: Diagnosis not present

## 2022-01-05 DIAGNOSIS — Z992 Dependence on renal dialysis: Secondary | ICD-10-CM | POA: Diagnosis not present

## 2022-01-05 DIAGNOSIS — N186 End stage renal disease: Secondary | ICD-10-CM | POA: Diagnosis not present

## 2022-01-05 DIAGNOSIS — N2581 Secondary hyperparathyroidism of renal origin: Secondary | ICD-10-CM | POA: Diagnosis not present

## 2022-01-07 DIAGNOSIS — N186 End stage renal disease: Secondary | ICD-10-CM | POA: Diagnosis not present

## 2022-01-07 DIAGNOSIS — D631 Anemia in chronic kidney disease: Secondary | ICD-10-CM | POA: Diagnosis not present

## 2022-01-07 DIAGNOSIS — N2581 Secondary hyperparathyroidism of renal origin: Secondary | ICD-10-CM | POA: Diagnosis not present

## 2022-01-07 DIAGNOSIS — Z992 Dependence on renal dialysis: Secondary | ICD-10-CM | POA: Diagnosis not present

## 2022-01-07 DIAGNOSIS — D509 Iron deficiency anemia, unspecified: Secondary | ICD-10-CM | POA: Diagnosis not present

## 2022-01-07 DIAGNOSIS — I129 Hypertensive chronic kidney disease with stage 1 through stage 4 chronic kidney disease, or unspecified chronic kidney disease: Secondary | ICD-10-CM | POA: Diagnosis not present

## 2022-01-10 DIAGNOSIS — N2581 Secondary hyperparathyroidism of renal origin: Secondary | ICD-10-CM | POA: Diagnosis not present

## 2022-01-10 DIAGNOSIS — N186 End stage renal disease: Secondary | ICD-10-CM | POA: Diagnosis not present

## 2022-01-10 DIAGNOSIS — D631 Anemia in chronic kidney disease: Secondary | ICD-10-CM | POA: Diagnosis not present

## 2022-01-10 DIAGNOSIS — D509 Iron deficiency anemia, unspecified: Secondary | ICD-10-CM | POA: Diagnosis not present

## 2022-01-10 DIAGNOSIS — Z992 Dependence on renal dialysis: Secondary | ICD-10-CM | POA: Diagnosis not present

## 2022-01-11 ENCOUNTER — Telehealth: Payer: Self-pay

## 2022-01-11 NOTE — Telephone Encounter (Signed)
        Patient  visited [place] on 8/29     Telephone encounter attempt :  1st  Unable to [place], [facility]  [phone] [address], [place], [place] [address] Phone: [phone] Email: [email or web address]

## 2022-01-12 DIAGNOSIS — Z992 Dependence on renal dialysis: Secondary | ICD-10-CM | POA: Diagnosis not present

## 2022-01-12 DIAGNOSIS — D631 Anemia in chronic kidney disease: Secondary | ICD-10-CM | POA: Diagnosis not present

## 2022-01-12 DIAGNOSIS — N186 End stage renal disease: Secondary | ICD-10-CM | POA: Diagnosis not present

## 2022-01-12 DIAGNOSIS — D509 Iron deficiency anemia, unspecified: Secondary | ICD-10-CM | POA: Diagnosis not present

## 2022-01-12 DIAGNOSIS — N2581 Secondary hyperparathyroidism of renal origin: Secondary | ICD-10-CM | POA: Diagnosis not present

## 2022-01-14 DIAGNOSIS — D509 Iron deficiency anemia, unspecified: Secondary | ICD-10-CM | POA: Diagnosis not present

## 2022-01-14 DIAGNOSIS — N186 End stage renal disease: Secondary | ICD-10-CM | POA: Diagnosis not present

## 2022-01-14 DIAGNOSIS — N2581 Secondary hyperparathyroidism of renal origin: Secondary | ICD-10-CM | POA: Diagnosis not present

## 2022-01-14 DIAGNOSIS — D631 Anemia in chronic kidney disease: Secondary | ICD-10-CM | POA: Diagnosis not present

## 2022-01-14 DIAGNOSIS — Z992 Dependence on renal dialysis: Secondary | ICD-10-CM | POA: Diagnosis not present

## 2022-01-17 DIAGNOSIS — D509 Iron deficiency anemia, unspecified: Secondary | ICD-10-CM | POA: Diagnosis not present

## 2022-01-17 DIAGNOSIS — N2581 Secondary hyperparathyroidism of renal origin: Secondary | ICD-10-CM | POA: Diagnosis not present

## 2022-01-17 DIAGNOSIS — N186 End stage renal disease: Secondary | ICD-10-CM | POA: Diagnosis not present

## 2022-01-17 DIAGNOSIS — Z992 Dependence on renal dialysis: Secondary | ICD-10-CM | POA: Diagnosis not present

## 2022-01-17 DIAGNOSIS — D631 Anemia in chronic kidney disease: Secondary | ICD-10-CM | POA: Diagnosis not present

## 2022-01-20 DIAGNOSIS — R001 Bradycardia, unspecified: Secondary | ICD-10-CM | POA: Diagnosis not present

## 2022-01-20 DIAGNOSIS — W19XXXA Unspecified fall, initial encounter: Secondary | ICD-10-CM | POA: Diagnosis not present

## 2022-01-20 DIAGNOSIS — R58 Hemorrhage, not elsewhere classified: Secondary | ICD-10-CM | POA: Diagnosis not present

## 2022-01-20 DIAGNOSIS — R000 Tachycardia, unspecified: Secondary | ICD-10-CM | POA: Diagnosis not present

## 2022-01-21 ENCOUNTER — Inpatient Hospital Stay (HOSPITAL_COMMUNITY)
Admission: EM | Admit: 2022-01-21 | Discharge: 2022-01-27 | DRG: 551 | Disposition: A | Discharge status: Skilled Nursing Facility | Payer: Medicare Other | Attending: Internal Medicine | Admitting: Internal Medicine

## 2022-01-21 ENCOUNTER — Emergency Department (HOSPITAL_COMMUNITY): Payer: Medicare Other

## 2022-01-21 ENCOUNTER — Other Ambulatory Visit: Payer: Self-pay

## 2022-01-21 ENCOUNTER — Encounter (HOSPITAL_COMMUNITY): Payer: Self-pay

## 2022-01-21 DIAGNOSIS — W19XXXA Unspecified fall, initial encounter: Secondary | ICD-10-CM | POA: Diagnosis not present

## 2022-01-21 DIAGNOSIS — W1830XA Fall on same level, unspecified, initial encounter: Secondary | ICD-10-CM | POA: Diagnosis present

## 2022-01-21 DIAGNOSIS — D638 Anemia in other chronic diseases classified elsewhere: Secondary | ICD-10-CM | POA: Diagnosis present

## 2022-01-21 DIAGNOSIS — Z8261 Family history of arthritis: Secondary | ICD-10-CM

## 2022-01-21 DIAGNOSIS — S0181XA Laceration without foreign body of other part of head, initial encounter: Secondary | ICD-10-CM | POA: Diagnosis present

## 2022-01-21 DIAGNOSIS — Z8719 Personal history of other diseases of the digestive system: Secondary | ICD-10-CM

## 2022-01-21 DIAGNOSIS — N2581 Secondary hyperparathyroidism of renal origin: Secondary | ICD-10-CM | POA: Diagnosis present

## 2022-01-21 DIAGNOSIS — M47814 Spondylosis without myelopathy or radiculopathy, thoracic region: Secondary | ICD-10-CM | POA: Diagnosis not present

## 2022-01-21 DIAGNOSIS — E8889 Other specified metabolic disorders: Secondary | ICD-10-CM | POA: Diagnosis present

## 2022-01-21 DIAGNOSIS — Z880 Allergy status to penicillin: Secondary | ICD-10-CM

## 2022-01-21 DIAGNOSIS — I4819 Other persistent atrial fibrillation: Secondary | ICD-10-CM | POA: Diagnosis present

## 2022-01-21 DIAGNOSIS — S12000A Unspecified displaced fracture of first cervical vertebra, initial encounter for closed fracture: Secondary | ICD-10-CM | POA: Diagnosis not present

## 2022-01-21 DIAGNOSIS — Z9181 History of falling: Secondary | ICD-10-CM

## 2022-01-21 DIAGNOSIS — K219 Gastro-esophageal reflux disease without esophagitis: Secondary | ICD-10-CM | POA: Diagnosis present

## 2022-01-21 DIAGNOSIS — K7030 Alcoholic cirrhosis of liver without ascites: Secondary | ICD-10-CM | POA: Diagnosis present

## 2022-01-21 DIAGNOSIS — F1021 Alcohol dependence, in remission: Secondary | ICD-10-CM | POA: Diagnosis present

## 2022-01-21 DIAGNOSIS — Z8249 Family history of ischemic heart disease and other diseases of the circulatory system: Secondary | ICD-10-CM

## 2022-01-21 DIAGNOSIS — I10 Essential (primary) hypertension: Secondary | ICD-10-CM | POA: Diagnosis present

## 2022-01-21 DIAGNOSIS — J302 Other seasonal allergic rhinitis: Secondary | ICD-10-CM | POA: Diagnosis present

## 2022-01-21 DIAGNOSIS — N186 End stage renal disease: Secondary | ICD-10-CM | POA: Diagnosis present

## 2022-01-21 DIAGNOSIS — I480 Paroxysmal atrial fibrillation: Secondary | ICD-10-CM | POA: Diagnosis present

## 2022-01-21 DIAGNOSIS — K760 Fatty (change of) liver, not elsewhere classified: Secondary | ICD-10-CM | POA: Diagnosis present

## 2022-01-21 DIAGNOSIS — F419 Anxiety disorder, unspecified: Secondary | ICD-10-CM | POA: Diagnosis present

## 2022-01-21 DIAGNOSIS — G4733 Obstructive sleep apnea (adult) (pediatric): Secondary | ICD-10-CM | POA: Diagnosis present

## 2022-01-21 DIAGNOSIS — Z86010 Personal history of colonic polyps: Secondary | ICD-10-CM

## 2022-01-21 DIAGNOSIS — Z8674 Personal history of sudden cardiac arrest: Secondary | ICD-10-CM

## 2022-01-21 DIAGNOSIS — R519 Headache, unspecified: Secondary | ICD-10-CM | POA: Diagnosis not present

## 2022-01-21 DIAGNOSIS — M48061 Spinal stenosis, lumbar region without neurogenic claudication: Secondary | ICD-10-CM | POA: Diagnosis not present

## 2022-01-21 DIAGNOSIS — F1010 Alcohol abuse, uncomplicated: Secondary | ICD-10-CM | POA: Diagnosis present

## 2022-01-21 DIAGNOSIS — Z87891 Personal history of nicotine dependence: Secondary | ICD-10-CM

## 2022-01-21 DIAGNOSIS — M25511 Pain in right shoulder: Secondary | ICD-10-CM | POA: Diagnosis present

## 2022-01-21 DIAGNOSIS — E876 Hypokalemia: Secondary | ICD-10-CM | POA: Diagnosis present

## 2022-01-21 DIAGNOSIS — I5032 Chronic diastolic (congestive) heart failure: Secondary | ICD-10-CM | POA: Diagnosis present

## 2022-01-21 DIAGNOSIS — S12001A Unspecified nondisplaced fracture of first cervical vertebra, initial encounter for closed fracture: Secondary | ICD-10-CM

## 2022-01-21 DIAGNOSIS — Z96643 Presence of artificial hip joint, bilateral: Secondary | ICD-10-CM | POA: Diagnosis present

## 2022-01-21 DIAGNOSIS — E785 Hyperlipidemia, unspecified: Secondary | ICD-10-CM | POA: Diagnosis present

## 2022-01-21 DIAGNOSIS — G2581 Restless legs syndrome: Secondary | ICD-10-CM | POA: Diagnosis present

## 2022-01-21 DIAGNOSIS — S0003XA Contusion of scalp, initial encounter: Secondary | ICD-10-CM | POA: Diagnosis present

## 2022-01-21 DIAGNOSIS — N400 Enlarged prostate without lower urinary tract symptoms: Secondary | ICD-10-CM | POA: Diagnosis present

## 2022-01-21 DIAGNOSIS — D631 Anemia in chronic kidney disease: Secondary | ICD-10-CM | POA: Diagnosis present

## 2022-01-21 DIAGNOSIS — I132 Hypertensive heart and chronic kidney disease with heart failure and with stage 5 chronic kidney disease, or end stage renal disease: Secondary | ICD-10-CM | POA: Diagnosis present

## 2022-01-21 DIAGNOSIS — M1990 Unspecified osteoarthritis, unspecified site: Secondary | ICD-10-CM | POA: Diagnosis present

## 2022-01-21 DIAGNOSIS — Y92003 Bedroom of unspecified non-institutional (private) residence as the place of occurrence of the external cause: Secondary | ICD-10-CM

## 2022-01-21 DIAGNOSIS — Z66 Do not resuscitate: Secondary | ICD-10-CM | POA: Diagnosis present

## 2022-01-21 DIAGNOSIS — R269 Unspecified abnormalities of gait and mobility: Secondary | ICD-10-CM | POA: Diagnosis present

## 2022-01-21 DIAGNOSIS — G4700 Insomnia, unspecified: Secondary | ICD-10-CM | POA: Diagnosis present

## 2022-01-21 DIAGNOSIS — Z79899 Other long term (current) drug therapy: Secondary | ICD-10-CM

## 2022-01-21 DIAGNOSIS — Z043 Encounter for examination and observation following other accident: Secondary | ICD-10-CM | POA: Diagnosis not present

## 2022-01-21 DIAGNOSIS — D61818 Other pancytopenia: Secondary | ICD-10-CM | POA: Diagnosis present

## 2022-01-21 DIAGNOSIS — I495 Sick sinus syndrome: Secondary | ICD-10-CM | POA: Diagnosis present

## 2022-01-21 DIAGNOSIS — F32A Depression, unspecified: Secondary | ICD-10-CM | POA: Diagnosis present

## 2022-01-21 DIAGNOSIS — Z992 Dependence on renal dialysis: Secondary | ICD-10-CM

## 2022-01-21 DIAGNOSIS — D696 Thrombocytopenia, unspecified: Secondary | ICD-10-CM | POA: Diagnosis present

## 2022-01-21 LAB — COMPREHENSIVE METABOLIC PANEL
ALT: 107 U/L — ABNORMAL HIGH (ref 0–44)
AST: 289 U/L — ABNORMAL HIGH (ref 15–41)
Albumin: 3.3 g/dL — ABNORMAL LOW (ref 3.5–5.0)
Alkaline Phosphatase: 107 U/L (ref 38–126)
Anion gap: 14 (ref 5–15)
BUN: 37 mg/dL — ABNORMAL HIGH (ref 8–23)
CO2: 23 mmol/L (ref 22–32)
Calcium: 9.3 mg/dL (ref 8.9–10.3)
Chloride: 96 mmol/L — ABNORMAL LOW (ref 98–111)
Creatinine, Ser: 3.35 mg/dL — ABNORMAL HIGH (ref 0.61–1.24)
GFR, Estimated: 18 mL/min — ABNORMAL LOW (ref >60)
Glucose, Bld: 88 mg/dL (ref 70–99)
Potassium: 3.4 mmol/L — ABNORMAL LOW (ref 3.5–5.1)
Sodium: 133 mmol/L — ABNORMAL LOW (ref 135–145)
Total Bilirubin: 1.3 mg/dL — ABNORMAL HIGH (ref 0.3–1.2)
Total Protein: 5.6 g/dL — ABNORMAL LOW (ref 6.5–8.1)

## 2022-01-21 LAB — BASIC METABOLIC PANEL
Anion gap: 10 (ref 5–15)
BUN: 36 mg/dL — ABNORMAL HIGH (ref 8–23)
CO2: 27 mmol/L (ref 22–32)
Calcium: 9 mg/dL (ref 8.9–10.3)
Chloride: 97 mmol/L — ABNORMAL LOW (ref 98–111)
Creatinine, Ser: 3.32 mg/dL — ABNORMAL HIGH (ref 0.61–1.24)
GFR, Estimated: 18 mL/min — ABNORMAL LOW (ref >60)
Glucose, Bld: 93 mg/dL (ref 70–99)
Potassium: 4 mmol/L (ref 3.5–5.1)
Sodium: 134 mmol/L — ABNORMAL LOW (ref 135–145)

## 2022-01-21 LAB — CBC WITH DIFFERENTIAL/PLATELET
Abs Immature Granulocytes: 0.02 10*3/uL (ref 0.00–0.07)
Basophils Absolute: 0 10*3/uL (ref 0.0–0.1)
Basophils Relative: 1 %
Eosinophils Absolute: 0 10*3/uL (ref 0.0–0.5)
Eosinophils Relative: 1 %
HCT: 25.2 % — ABNORMAL LOW (ref 39.0–52.0)
Hemoglobin: 8.8 g/dL — ABNORMAL LOW (ref 13.0–17.0)
Immature Granulocytes: 1 %
Lymphocytes Relative: 22 %
Lymphs Abs: 0.9 10*3/uL (ref 0.7–4.0)
MCH: 36.1 pg — ABNORMAL HIGH (ref 26.0–34.0)
MCHC: 34.9 g/dL (ref 30.0–36.0)
MCV: 103.3 fL — ABNORMAL HIGH (ref 80.0–100.0)
Monocytes Absolute: 0.6 10*3/uL (ref 0.1–1.0)
Monocytes Relative: 14 %
Neutro Abs: 2.8 10*3/uL (ref 1.7–7.7)
Neutrophils Relative %: 61 %
Platelets: 99 10*3/uL — ABNORMAL LOW (ref 150–400)
RBC: 2.44 MIL/uL — ABNORMAL LOW (ref 4.22–5.81)
RDW: 15.4 % (ref 11.5–15.5)
WBC: 4.4 10*3/uL (ref 4.0–10.5)
nRBC: 0 % (ref 0.0–0.2)

## 2022-01-21 LAB — HEPATITIS C ANTIBODY: HCV Ab: NONREACTIVE

## 2022-01-21 LAB — HEPATITIS B SURFACE ANTIGEN: Hepatitis B Surface Ag: NONREACTIVE

## 2022-01-21 LAB — ETHANOL: Alcohol, Ethyl (B): 115 mg/dL — ABNORMAL HIGH (ref <10)

## 2022-01-21 LAB — CBC
HCT: 25.9 % — ABNORMAL LOW (ref 39.0–52.0)
Hemoglobin: 8.5 g/dL — ABNORMAL LOW (ref 13.0–17.0)
MCH: 35.6 pg — ABNORMAL HIGH (ref 26.0–34.0)
MCHC: 32.8 g/dL (ref 30.0–36.0)
MCV: 108.4 fL — ABNORMAL HIGH (ref 80.0–100.0)
Platelets: 99 10*3/uL — ABNORMAL LOW (ref 150–400)
RBC: 2.39 MIL/uL — ABNORMAL LOW (ref 4.22–5.81)
RDW: 15.9 % — ABNORMAL HIGH (ref 11.5–15.5)
WBC: 4.2 10*3/uL (ref 4.0–10.5)
nRBC: 0 % (ref 0.0–0.2)

## 2022-01-21 LAB — MAGNESIUM: Magnesium: 2.1 mg/dL (ref 1.7–2.4)

## 2022-01-21 LAB — HEPATITIS B CORE ANTIBODY, TOTAL: Hep B Core Total Ab: NONREACTIVE

## 2022-01-21 LAB — HEPATITIS B SURFACE ANTIBODY,QUALITATIVE: Hep B S Ab: REACTIVE — AB

## 2022-01-21 MED ORDER — THIAMINE HCL 100 MG/ML IJ SOLN
100.0000 mg | Freq: Every day | INTRAMUSCULAR | Status: DC
  Filled 2022-01-21: qty 2

## 2022-01-21 MED ORDER — PENTAFLUOROPROP-TETRAFLUOROETH EX AERO: 1.0000 | INHALATION_SPRAY | CUTANEOUS | Status: DC | PRN

## 2022-01-21 MED ORDER — LIDOCAINE-EPINEPHRINE (PF) 2 %-1:200000 IJ SOLN: 10.0000 mL | Freq: Once | INTRAMUSCULAR | Status: DC

## 2022-01-21 MED ORDER — ANTICOAGULANT SODIUM CITRATE 4% (200MG/5ML) IV SOLN: 5.0000 mL | Status: DC | PRN

## 2022-01-21 MED ORDER — LACTATED RINGERS IV BOLUS
1000.0000 mL | Freq: Once | INTRAVENOUS | Status: AC
  Administered 2022-01-21: 1000 mL via INTRAVENOUS

## 2022-01-21 MED ORDER — HEPARIN SODIUM (PORCINE) 1000 UNIT/ML DIALYSIS: 1000.0000 [IU] | INTRAMUSCULAR | Status: DC | PRN

## 2022-01-21 MED ORDER — MORPHINE SULFATE (PF) 4 MG/ML IV SOLN
4.0000 mg | Freq: Once | INTRAVENOUS | Status: AC
  Administered 2022-01-21: 4 mg via INTRAVENOUS
  Filled 2022-01-21: qty 1

## 2022-01-21 MED ORDER — THIAMINE MONONITRATE 100 MG PO TABS
100.0000 mg | ORAL_TABLET | Freq: Every day | ORAL | Status: DC
  Administered 2022-01-21 – 2022-01-27 (×7): 100 mg via ORAL
  Filled 2022-01-21 (×7): qty 1

## 2022-01-21 MED ORDER — FOLIC ACID 1 MG PO TABS
1.0000 mg | ORAL_TABLET | Freq: Every day | ORAL | Status: DC
  Administered 2022-01-21 – 2022-01-27 (×7): 1 mg via ORAL
  Filled 2022-01-21 (×7): qty 1

## 2022-01-21 MED ORDER — ALTEPLASE 2 MG IJ SOLR: 2.0000 mg | Freq: Once | INTRAMUSCULAR | Status: DC | PRN

## 2022-01-21 MED ORDER — ATORVASTATIN CALCIUM 10 MG PO TABS
20.0000 mg | ORAL_TABLET | Freq: Every day | ORAL | Status: DC
  Administered 2022-01-22 – 2022-01-27 (×6): 20 mg via ORAL
  Filled 2022-01-21 (×6): qty 2

## 2022-01-21 MED ORDER — LIDOCAINE-EPINEPHRINE 1 %-1:100000 IJ SOLN
10.0000 mL | Freq: Once | INTRAMUSCULAR | Status: AC
  Administered 2022-01-21: 10 mL via INTRADERMAL
  Filled 2022-01-21: qty 1

## 2022-01-21 MED ORDER — HYDROMORPHONE HCL 1 MG/ML IJ SOLN
1.0000 mg | Freq: Once | INTRAMUSCULAR | Status: AC
  Administered 2022-01-21: 1 mg via INTRAVENOUS
  Filled 2022-01-21: qty 1

## 2022-01-21 MED ORDER — ADULT MULTIVITAMIN W/MINERALS CH
1.0000 | ORAL_TABLET | Freq: Every day | ORAL | Status: DC
  Administered 2022-01-21 – 2022-01-27 (×7): 1 via ORAL
  Filled 2022-01-21 (×7): qty 1

## 2022-01-21 MED ORDER — TORSEMIDE 20 MG PO TABS
60.0000 mg | ORAL_TABLET | Freq: Every day | ORAL | Status: DC
  Administered 2022-01-22 – 2022-01-27 (×6): 60 mg via ORAL
  Filled 2022-01-21 (×6): qty 3

## 2022-01-21 MED ORDER — ONDANSETRON HCL 4 MG/2ML IJ SOLN
4.0000 mg | Freq: Once | INTRAMUSCULAR | Status: AC
  Administered 2022-01-21: 4 mg via INTRAVENOUS
  Filled 2022-01-21: qty 2

## 2022-01-21 MED ORDER — TAMSULOSIN HCL 0.4 MG PO CAPS
0.4000 mg | ORAL_CAPSULE | Freq: Every day | ORAL | Status: DC
  Administered 2022-01-22 – 2022-01-26 (×5): 0.4 mg via ORAL
  Filled 2022-01-21 (×6): qty 1

## 2022-01-21 MED ORDER — HYDROMORPHONE HCL 1 MG/ML IJ SOLN
0.5000 mg | INTRAMUSCULAR | Status: DC | PRN
  Administered 2022-01-24 – 2022-01-26 (×3): 0.5 mg via INTRAVENOUS
  Filled 2022-01-21 (×3): qty 0.5

## 2022-01-21 MED ORDER — ESCITALOPRAM OXALATE 10 MG PO TABS
10.0000 mg | ORAL_TABLET | Freq: Every day | ORAL | Status: DC
  Administered 2022-01-22 – 2022-01-27 (×6): 10 mg via ORAL
  Filled 2022-01-21 (×6): qty 1

## 2022-01-21 MED ORDER — CHLORHEXIDINE GLUCONATE CLOTH 2 % EX PADS
6.0000 | MEDICATED_PAD | Freq: Every day | CUTANEOUS | Status: DC
  Administered 2022-01-22 – 2022-01-25 (×4): 6 via TOPICAL

## 2022-01-21 MED ORDER — LIDOCAINE HCL (PF) 1 % IJ SOLN: 5.0000 mL | INTRAMUSCULAR | Status: DC | PRN

## 2022-01-21 MED ORDER — AMIODARONE HCL 200 MG PO TABS
200.0000 mg | ORAL_TABLET | Freq: Every day | ORAL | Status: DC
  Administered 2022-01-22 – 2022-01-27 (×5): 200 mg via ORAL
  Filled 2022-01-21 (×6): qty 1

## 2022-01-21 MED ORDER — LIDOCAINE-PRILOCAINE 2.5-2.5 % EX CREA: 1.0000 | TOPICAL_CREAM | CUTANEOUS | Status: DC | PRN

## 2022-01-21 MED ORDER — CARVEDILOL 6.25 MG PO TABS
6.2500 mg | ORAL_TABLET | Freq: Two times a day (BID) | ORAL | Status: DC
  Administered 2022-01-22 – 2022-01-27 (×10): 6.25 mg via ORAL
  Filled 2022-01-21 (×12): qty 1

## 2022-01-21 NOTE — Progress Notes (Signed)
PROGRESS NOTE    CON ARGANBRIGHT  XQJ:[id number] DOB: [birth year] DOA: 01/21/2022 PCP: [name], MD   Brief Narrative:  Darryl Diaz is a 81 y.o. male with medical history significant of ESRD on HD MWF, chronic diastolic CHF, paroxysmal A-fib not on anticoagulation due to recurrent falls, tachy-brady syndrome, hypertension, hyperlipidemia, anxiety/depression, GERD, chronic pancytopenia, history of GI bleed, alcohol abuse, alcoholic liver cirrhosis, OSA not on CPAP, history of cardiac arrest in 2020. Patient lives alone at home and presented to the ED tonight via EMS after he had a fall while attempting to get in bed - notes he lost balance without LOC.  CT C-spine showing minimally displaced fracture of the C1 right superior articular process.  CT head showing large left frontal scalp hematoma without skull fracture or any acute intracranial abnormality.  Remainder of trauma scans including x-ray of right shoulder and CT of lumbar and thoracic spine negative for acute traumatic injuries. Placed in [place] [facility] collar and neurosurgery consulted (Dr. Christella Noa) who recommends to remain in collar with outpatient follow up in clinic (no indication for intervention based on their evaluation).   Assessment & Plan:   Principal Problem:   C1 cervical fracture (HCC) Active Problems:   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HCC)   Paroxysmal atrial fibrillation (HCC)   Hypertension   Hyperlipidemia   Anemia, chronic disease   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HCC)   Chronic diastolic CHF (congestive heart failure) (HCC)   Alcohol abuse   Minimally displaced fracture of the C1 right superior articular process -Secondary to mechanical fall, denies LOC; does drink occasionally -He is not on any antiplatelet agents or anticoagulation. -Continue [place] [facility] collar per neuroSx until office follow up   Recurrent falls, ambulatory dysfunction Complicated by alcohol use -Patient currently  living alone at home - PT to evaluate for safe dispo -PT/OT consult pending   ESRD on HD MWF Chronic diastolic CHF -HD per nephrology   Elevated transaminases Alcoholic liver cirrhosis Alcohol use disorder -Alcohol use daily - follow labs/symptoms -CIWA monitoring -Thiamine, folate, multivitamin ongoing   Chronic anemia -Hemoglobin 8.8, no significant change from baseline -Has a large forehead hematoma/laceration but no other obvious bleeding -Continue to monitor   Chronic thrombocytopenia -Platelet count 99k -Continue to monitor   Large left frontal scalp hematoma with forehead laceration -No skull fracture or acute intracranial abnormality on CT -Forehead laceration repaired per ED   QT prolongation -Cardiac monitoring -Check magnesium level -Avoid QT prolonging drugs -Repeat EKG in a.m.   Paroxysmal A-fib: Not on anticoagulation due to recurrent falls. 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Hypertension Hyperlipidemia -Continue home amiodarone, carvedilol, statin  Anxiety/depression -Continue home lexapro  BPH -Continue tamsulosin  GERD -Well controlled  DVT prophylaxis: None given bleeding as above Code Status: DNR Family Communication: None present  Status is: Inpt  Dispo: The patient is from: Home              Anticipated d/c is to: TBD              Anticipated d/c date is: 24-48h              Patient currently NOT medically stable for discharge  Consultants:  NeuroSx  Procedures:  None  Antimicrobials:  None   Subjective: No acute issue/events overnight  Objective: Vitals:   01/21/22 0430 01/21/22 0445 01/21/22 0500 01/21/22 0637  BP: (!) 131/57 116/76 (!) 111/51   Pulse: 68 63 64   Resp:  Temp:    97.6 F (36.4 C)  TempSrc:    Oral  SpO2: 100% 100% 100%   Weight:      Height:       No intake or output data in the 24 hours ending 01/21/22 0807 Filed Weights   01/21/22 0033  Weight: 83 kg    Examination:  General exam: Appears calm  and comfortable  Respiratory system: Clear to auscultation. Respiratory effort normal. Cardiovascular system: S1 & S2 heard, RRR. No JVD, murmurs, rubs, gallops or clicks. No pedal edema. Gastrointestinal system: Abdomen is nondistended, soft and nontender. No organomegaly or masses felt. Normal bowel sounds heard. Central nervous system: Alert and oriented. No focal neurological deficits. Extremities: Symmetric 5 x 5 power. Skin: Large forehead hematoma/laceration - minimal bleeding ongoing  Data Reviewed: I have personally reviewed following labs and imaging studies  CBC: Recent Labs  Lab 01/21/22 0027  WBC 4.4  NEUTROABS 2.8  HGB 8.8*  HCT 25.2*  MCV 103.3*  PLT 99*   Basic Metabolic Panel: Recent Labs  Lab 01/21/22 0027  NA 133*  K 3.4*  CL 96*  CO2 23  GLUCOSE 88  BUN 37*  CREATININE 3.35*  CALCIUM 9.3   GFR: Estimated Creatinine Clearance: 19.3 mL/min (A) (by C-G formula based on SCr of 3.35 mg/dL (H)). Liver Function Tests: Recent Labs  Lab 01/21/22 0027  AST 289*  ALT 107*  ALKPHOS 107  BILITOT 1.3*  PROT 5.6*  ALBUMIN 3.3*   No results for input(s): "LIPASE", "AMYLASE" in the last 168 hours. No results for input(s): "AMMONIA" in the last 168 hours. Coagulation Profile: No results for input(s): "INR", "PROTIME" in the last 168 hours. Cardiac Enzymes: No results for input(s): "CKTOTAL", "CKMB", "CKMBINDEX", "TROPONINI" in the last 168 hours. BNP (last 3 results) No results for input(s): "PROBNP" in the last 8760 hours. HbA1C: No results for input(s): "HGBA1C" in the last 72 hours. CBG: No results for input(s): "GLUCAP" in the last 168 hours. Lipid Profile: No results for input(s): "CHOL", "HDL", "LDLCALC", "TRIG", "CHOLHDL", "LDLDIRECT" in the last 72 hours. Thyroid Function Tests: No results for input(s): "TSH", "T4TOTAL", "FREET4", "T3FREE", "THYROIDAB" in the last 72 hours. Anemia Panel: No results for input(s): "VITAMINB12", "FOLATE",  "FERRITIN", "TIBC", "IRON", "RETICCTPCT" in the last 72 hours. Sepsis Labs: No results for input(s): "PROCALCITON", "LATICACIDVEN" in the last 168 hours.  No results found for this or any previous visit (from the past 240 hour(s)).       Radiology Studies: CT Thoracic Spine Wo Contrast  Result Date: 01/21/2022 CLINICAL DATA:  Fall EXAM: CT THORACIC AND LUMBAR SPINE WITHOUT CONTRAST TECHNIQUE: Multidetector CT imaging of the thoracic and lumbar spine was performed without contrast. Multiplanar CT image reconstructions were also generated. RADIATION DOSE REDUCTION: This exam was performed according to the departmental dose-optimization program which includes automated exposure control, adjustment of the mA and/or kV according to patient size and/or use of iterative reconstruction technique. COMPARISON:  None Available. FINDINGS: CT THORACIC SPINE FINDINGS Alignment: Normal. Vertebrae: No acute fracture or focal pathologic process. Spondylosis of the lower thoracic spine. Paraspinal and other soft tissues: Calcific aortic atherosclerosis. Disc levels: No high-grade spinal canal stenosis CT LUMBAR SPINE FINDINGS Segmentation: 5 lumbar type vertebrae. Alignment: Normal. Vertebrae: No acute fracture or focal pathologic process. Paraspinal and other soft tissues: Negative. Disc levels: There is moderate-to-severe spinal canal stenosis at the L2-5 levels. IMPRESSION: 1. No acute fracture or static subluxation of the thoracic or lumbar spine. 2. 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 3. Aortic Atherosclerosis (ICD10-I70.0). Electronically Signed   By: Ulyses Jarred M.D.   On: 01/21/2022 02:12   CT Lumbar Spine Wo Contrast  Result Date: 01/21/2022 CLINICAL DATA:  Fall EXAM: CT THORACIC AND LUMBAR SPINE WITHOUT CONTRAST TECHNIQUE: Multidetector CT imaging of the thoracic and lumbar spine was performed without contrast. Multiplanar CT image reconstructions were also generated. RADIATION  DOSE REDUCTION: This exam was performed according to the departmental dose-optimization program which includes automated exposure control, adjustment of the mA and/or kV according to patient size and/or use of iterative reconstruction technique. COMPARISON:  None Available. FINDINGS: CT THORACIC SPINE FINDINGS Alignment: Normal. Vertebrae: No acute fracture or focal pathologic process. Spondylosis of the lower thoracic spine. Paraspinal and other soft tissues: Calcific aortic atherosclerosis. Disc levels: No high-grade spinal canal stenosis CT LUMBAR SPINE FINDINGS Segmentation: 5 lumbar type vertebrae. Alignment: Normal. Vertebrae: No acute fracture or focal pathologic process. Paraspinal and other soft tissues: Negative. Disc levels: There is moderate-to-severe spinal canal stenosis at the L2-5 levels. IMPRESSION: 1. No acute fracture or static subluxation of the thoracic or lumbar spine. 2. 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 3. Aortic Atherosclerosis (ICD10-I70.0). Electronically Signed   By: Ulyses Jarred M.D.   On: 01/21/2022 02:12   CT HEAD WO CONTRAST (5MM)  Result Date: 01/21/2022 CLINICAL DATA:  Fall EXAM: CT HEAD WITHOUT CONTRAST CT CERVICAL SPINE WITHOUT CONTRAST TECHNIQUE: Multidetector CT imaging of the head and cervical spine was performed following the standard protocol without intravenous contrast. Multiplanar CT image reconstructions of the cervical spine were also generated. RADIATION DOSE REDUCTION: This exam was performed according to the departmental dose-optimization program which includes automated exposure control, adjustment of the mA and/or kV according to patient size and/or use of iterative reconstruction technique. COMPARISON:  None Available. FINDINGS: CT HEAD FINDINGS Brain: There is no mass, hemorrhage or extra-axial collection. There is generalized atrophy without lobar predilection. There is hypoattenuation of the periventricular white matter, most  commonly indicating chronic ischemic microangiopathy. Vascular: No abnormal hyperdensity of the major intracranial arteries or dural venous sinuses. No intracranial atherosclerosis. Skull: Large left frontal scalp hematoma.  No skull fracture. Sinuses/Orbits: No fluid levels or advanced mucosal thickening of the visualized paranasal sinuses. No mastoid or middle ear effusion. The orbits are normal. CT CERVICAL SPINE FINDINGS Alignment: No static subluxation. Facets are aligned. Occipital condyles are normally positioned. Skull base and vertebrae: There is a minimally displaced fracture of the C1 right superior articular process where it contacts the right occipital condyle. There is no other fracture of the cervical spine. Soft tissues and spinal canal: No prevertebral fluid or swelling. No visible canal hematoma. Disc levels: No advanced spinal canal or neural foraminal stenosis. Upper chest: No pneumothorax, pulmonary nodule or pleural effusion. Other: Normal visualized paraspinal cervical soft tissues. IMPRESSION: 1. Minimally displaced fracture of the C1 right superior articular process where it contacts the right occipital condyle. 2. No acute intracranial abnormality. 3. Large left frontal scalp hematoma without skull fracture. These results were called by telephone at the time of interpretation on 01/21/2022 at 2:07 am to provider MADISON [facility] , who verbally acknowledged these results. Electronically Signed   By: Ulyses Jarred M.D.   On: 01/21/2022 02:07   CT Cervical Spine Wo Contrast  Result Date: 01/21/2022 CLINICAL DATA:  Fall EXAM: CT HEAD WITHOUT CONTRAST CT CERVICAL SPINE WITHOUT CONTRAST TECHNIQUE: Multidetector CT imaging of the head and cervical spine was performed following the standard protocol  without intravenous contrast. Multiplanar CT image reconstructions of the cervical spine were also generated. RADIATION DOSE REDUCTION: This exam was performed according to the departmental  dose-optimization program which includes automated exposure control, adjustment of the mA and/or kV according to patient size and/or use of iterative reconstruction technique. COMPARISON:  None Available. FINDINGS: CT HEAD FINDINGS Brain: There is no mass, hemorrhage or extra-axial collection. There is generalized atrophy without lobar predilection. There is hypoattenuation of the periventricular white matter, most commonly indicating chronic ischemic microangiopathy. Vascular: No abnormal hyperdensity of the major intracranial arteries or dural venous sinuses. No intracranial atherosclerosis. Skull: Large left frontal scalp hematoma.  No skull fracture. Sinuses/Orbits: No fluid levels or advanced mucosal thickening of the visualized paranasal sinuses. No mastoid or middle ear effusion. The orbits are normal. CT CERVICAL SPINE FINDINGS Alignment: No static subluxation. Facets are aligned. Occipital condyles are normally positioned. Skull base and vertebrae: There is a minimally displaced fracture of the C1 right superior articular process where it contacts the right occipital condyle. There is no other fracture of the cervical spine. Soft tissues and spinal canal: No prevertebral fluid or swelling. No visible canal hematoma. Disc levels: No advanced spinal canal or neural foraminal stenosis. Upper chest: No pneumothorax, pulmonary nodule or pleural effusion. Other: Normal visualized paraspinal cervical soft tissues. IMPRESSION: 1. Minimally displaced fracture of the C1 right superior articular process where it contacts the right occipital condyle. 2. No acute intracranial abnormality. 3. Large left frontal scalp hematoma without skull fracture. These results were called by telephone at the time of interpretation on 01/21/2022 at 2:07 am to provider MADISON [facility] , who verbally acknowledged these results. Electronically Signed   By: Ulyses Jarred M.D.   On: 01/21/2022 02:07   DG Shoulder Right  Result Date:  01/21/2022 CLINICAL DATA:  Fall, right shoulder pain EXAM: RIGHT SHOULDER - 2+ VIEW COMPARISON:  None Available. FINDINGS: Joints Fe cyst narrowing and spurring in the [facility] and glenohumeral joints. No acute bony abnormality. Specifically, no fracture, subluxation, or dislocation. Old healed right rib fractures. IMPRESSION: No acute bony abnormality. Electronically Signed   By: Rolm Baptise M.D.   On: 01/21/2022 01:43    Scheduled Meds:  folic acid  1 mg Oral Daily   multivitamin with minerals  1 tablet Oral Daily   thiamine  100 mg Oral Daily   Or   thiamine  100 mg Intravenous Daily    LOS: 0 days   Time spent: 25mn  Kristen Bushway C Stancil Deisher, DO [facility]  If 7PM-7AM, please contact night-coverage [email or web address]  01/21/2022, 8:07 AM

## 2022-01-21 NOTE — Consult Note (Signed)
BP (!) 111/51   Pulse 64   Temp 97.6 F (36.4 C) (Oral)   Resp (!) 21   Ht 6' (1.829 m)   Wt 83 kg   SpO2 100%   BMI 24.82 kg/m  Darryl Diaz is a 81 y.o. male who fell today with hx of Etoh abuse, multiple medical problems, multiple recent syncopal episodes, and a right C1 fracture. Neurologically intact. Allergies  Allergen Reactions   Penicillin G Sodium Rash and Other (See Comments)     (tolerates Keflex)   Penicillins Rash   Past Medical History:  Diagnosis Date   Alcohol abuse 16/96/7893   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[place]) 03/17/2020   Alcoholism ([place])    in remission following wife's death   Anemia    Anxiety    Ascending aorta dilation (HCC)    Atrial fibrillation (HCC)    Cardiac arrest ([place]) 04/20/2019   12 min CPR with epi   Chronic diastolic CHF (congestive heart failure) ([place])    Dysrhythmia    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[name])    Mon Wed Fri   Fatty liver    Fracture of hip ([place]) 03/30/2016   Gastritis    GERD (gastroesophageal reflux disease)    H/O seasonal allergies    Heme positive stool 05/14/2020   History of blood transfusion    History of GI bleed    Hx of adenomatous colonic polyps 08/10/2017   Hyperlipidemia    Hypertension    Insomnia    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   Mallory-Weiss tear    OA (osteoarthritis)    hands   OSA (obstructive sleep apnea)    No longer uses CPAP   Persistent atrial fibrillation with rapid ventricular response ([place]) 02/05/2020   Posterior dislocation of hip, closed ([place]) 04/30/2016   Recurrent syncope    Restless legs syndrome (RLS)    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([place])    Past Surgical History:  Procedure Laterality Date   AMPUTATION Left 05/19/2021   Procedure: LEFT GREAT TOE AMPUTATION;  Surgeon: Newt Minion, MD;  Location: [place];  Service: Orthopedics;  Laterality: Left;   ANKLE SURGERY Left 2014   had rods put in    AV FISTULA PLACEMENT Left 04/21/2021    Procedure: LEFT ARM ARTERIOVENOUS (AV) FISTULA CREATION - Left brachiocephalic fistula;  Surgeon: Marty Heck, MD;  Location: [place];  Service: Vascular;  Laterality: Left;   BIOPSY  02/28/2019   Procedure: BIOPSY;  Surgeon: Milus Banister, MD;  Location: [place];  Service: Endoscopy;;   CARDIOVERSION  02/06/2020   CARDIOVERSION N/A 02/06/2020   Procedure: CARDIOVERSION;  Surgeon: Werner Lean, MD;  Location: [facility];  Service: Cardiovascular;  Laterality: N/A;   CARDIOVERSION N/A 02/24/2020   Procedure: CARDIOVERSION;  Surgeon: Werner Lean, MD;  Location: [place];  Service: Cardiovascular;  Laterality: N/A;   COLONOSCOPY     [place]   COLONOSCOPY WITH PROPOFOL N/A 01/08/2021   Procedure: COLONOSCOPY WITH PROPOFOL;  Surgeon: Milus Banister, MD;  Location: [facility] ENDOSCOPY;  Service: Endoscopy;  Laterality: N/A;   ESOPHAGOGASTRODUODENOSCOPY (EGD) WITH PROPOFOL N/A 02/28/2019   Procedure: ESOPHAGOGASTRODUODENOSCOPY (EGD) WITH PROPOFOL;  Surgeon: Milus Banister, MD;  Location: [facility] END[facility]COPY;  Service: Endoscopy;  Laterality: N/A;   FRACTURE SURGERY     HIP ARTHROPLASTY Left 04/01/2016   Procedure: ARTHROPLASTY BIPOLAR HIP (HEMIARTHROPLASTY);  Surgeon: Paralee Cancel, MD;  Location: [place];  Service: Orthopedics;  Laterality: Left;   HIP CLOSED REDUCTION Left 04/30/2016   Procedure: CLOSED REDUCTION HIP;  Surgeon: Wylene Simmer, MD;  Location: [place];  Service: Orthopedics;  Laterality: Left;   HOT HEMOSTASIS N/A 01/08/2021   Procedure: HOT HEMOSTASIS (ARGON PLASMA COAGULATION/BICAP);  Surgeon: Milus Banister, MD;  Location: [facility] ENDOSCOPY;  Service: Endoscopy;  Laterality: N/A;   IR RADIOLOGIST EVAL & MGMT  12/31/2020   JOINT REPLACEMENT     TONSILLECTOMY     TOTAL HIP ARTHROPLASTY Right 10/15/2019   Procedure: TOTAL HIP ARTHROPLASTY ANTERIOR APPROACH;  Surgeon: Paralee Cancel, MD;  Location: [facility];  Service: Orthopedics;  Laterality: Right;  70 mins    VASCULAR SURGERY     Prior to Admission medications   Medication Sig Start Date End Date Taking? Authorizing Provider  amiodarone (PACERONE) 200 MG tablet TAKE 1 TABLET BY MOUTH ONCE DAILY Patient taking differently: Take 200 mg by mouth every morning. 05/17/21  Yes Weaver, Scott T, PA-C  tamsulosin (FLOMAX) 0.4 MG CAPS capsule Take 0.4 mg by mouth at bedtime.   Yes [provider]  torsemide (DEMADEX) 20 MG tablet Take 60 mg by mouth daily.   Yes [provider]  traZODone (DESYREL) 50 MG tablet Take 1 tablet (50 mg total) by mouth at bedtime. 11/26/21  Yes Alma Friendly, MD  atorvastatin (LIPITOR) 20 MG tablet Take 1 tablet (20 mg total) by mouth daily. 03/10/20   Angiulli, Lavon Paganini, PA-C  carvedilol (COREG) 6.25 MG tablet Take 1 tablet (6.25 mg total) by mouth 2 (two) times daily. Patient taking differently: Take 12.5 mg by mouth daily. 02/11/21   Sherren Mocha, MD  cetirizine (ZYRTEC) 10 MG tablet Take 10 mg by mouth daily.    [provider]  escitalopram (LEXAPRO) 10 MG tablet Take 1 tablet (10 mg total) by mouth daily. Patient taking differently: Take 10 mg by mouth every morning. 03/10/20   Angiulli, Lavon Paganini, PA-C  ferrous sulfate 324 MG TBEC Take 324 mg by mouth every morning.    [provider]  FOLIC ACID PO Take 1 tablet by mouth every morning.    [provider]  Magnesium Oxide 400 MG CAPS Take 1 capsule (400 mg total) by mouth daily. 10/17/20   Eugenie Filler, MD  Multiple Vitamin (MULTIVITAMIN PO) Take 1 tablet by mouth every other day.    [provider]  nitroGLYCERIN (NITROSTAT) 0.4 MG SL tablet Place 1 tablet (0.4 mg total) under the tongue every 5 (five) minutes x 3 doses as needed for chest pain. 07/28/20   Baldwin Jamaica, PA-C  pantoprazole (PROTONIX) 40 MG tablet Take 1 tablet (40 mg total) by mouth daily. Patient taking differently: Take 40 mg by mouth every morning. 03/10/20   Angiulli, Lavon Paganini, PA-C   sodium bicarbonate 650 MG tablet Take 650 mg by mouth daily.    [provider]    Family History  Problem Relation Age of Onset   Heart disease Mother 22   Hypertension Mother    Arthritis Mother    Heart failure Father 66   Stroke Maternal Aunt    Heart failure Sister    Colon cancer Neg Hx    Esophageal cancer Neg Hx    Stomach cancer Neg Hx    Rectal cancer Neg Hx    Social History   Socioeconomic History   Marital status: Widowed    Spouse name: Not on file   Number of children: 2   Years of  education: college   Highest education level: Not on file  Occupational History   Occupation: retired  Tobacco Use   Smoking status: Former    Packs/day: 1.00    Years: 20.00    Total pack years: 20.00    Types: Cigarettes   Smokeless tobacco: Never   Tobacco comments:       quit 20 years  smoked on and off for 20 years  Vaping Use   Vaping Use: Never used  Substance and Sexual Activity   Alcohol use: Not Currently    Alcohol/week: 3.0 standard drinks of alcohol    Types: 3 Glasses of wine per week    Comment: h/o heavy use   Drug use: Never   Sexual activity: Not Currently  Other Topics Concern   Not on file  Social History Narrative   ** Merged History Encounter **       The patient is divorced w/ 1 son 1 daughter He is a retired Regulatory affairs officer, he lived in [place] but [employer] including windows on the world in the [place] room in [place] He moved to [place] to be near his sister who liv   es in [place] alcoholic member of AA 2 caffeinated beverages daily prior smoker no drugs   Social Determinants of Radio broadcast assistant Strain: Low Risk  (10/23/2020)   Overall Financial Resource Strain (CARDIA)    Difficulty of Paying Living Expenses: Not hard at all  Food Insecurity: No Food Insecurity (10/23/2020)   Hunger Vital Sign    Worried About Running Out of Food in the Last Year: Never true    [place] in the Last Year: Never true  Transportation Needs: No Transportation Needs (10/23/2020)   PRAPARE - Hydrologist (Medical): No    Lack of Transportation (Non-Medical): No  Physical Activity: Not on file  Stress: No Stress Concern Present (10/23/2020)   [place]    Feeling of Stress : Not at all  Social Connections: Moderately Integrated (10/23/2020)   Social Connection and Isolation Panel [NHANES]    Frequency of Communication with Friends and Family: More than three times a week    Frequency of Social Gatherings with Friends and Family: More than three times a week    Attends Religious Services: 1 to 4 times per year    Active Member of [employer] or Organizations: No    Attends Archivist Meetings: 1 to 4 times per year    Marital Status: Widowed   Physical Exam Constitutional:      Comments: Laceration, dried blood on face  HENT:     Right Ear: External ear normal.     Left Ear: External ear normal.     Nose: Nose normal.  Eyes:     Extraocular Movements: Extraocular movements intact.     Pupils: Pupils are equal, round, and reactive to light.  Neck:     Comments: In cervical collar Skin:    General: Skin is warm.     Coloration: Skin is pale.  Neurological:     General: No focal deficit present.     Mental Status: He is alert and oriented to person, place, and time.     Cranial Nerves: Cranial nerves 2-12 are intact.     Sensory: Sensation is intact.     Motor: Motor function is intact.     Comments:  No change in strength.  Gait not assessed   Psychiatric:        Mood and Affect: Mood normal.   CT Thoracic Spine Wo Contrast  Result Date: 01/21/2022 CLINICAL DATA:  Fall EXAM: CT THORACIC AND LUMBAR SPINE WITHOUT CONTRAST TECHNIQUE: Multidetector CT imaging of the thoracic and lumbar spine was performed without contrast. Multiplanar CT image reconstructions were also  generated. RADIATION DOSE REDUCTION: This exam was performed according to the departmental dose-optimization program which includes automated exposure control, adjustment of the mA and/or kV according to patient size and/or use of iterative reconstruction technique. COMPARISON:  None Available. FINDINGS: CT THORACIC SPINE FINDINGS Alignment: Normal. Vertebrae: No acute fracture or focal pathologic process. Spondylosis of the lower thoracic spine. Paraspinal and other soft tissues: Calcific aortic atherosclerosis. Disc levels: No high-grade spinal canal stenosis CT LUMBAR SPINE FINDINGS Segmentation: 5 lumbar type vertebrae. Alignment: Normal. Vertebrae: No acute fracture or focal pathologic process. Paraspinal and other soft tissues: Negative. Disc levels: There is moderate-to-severe spinal canal stenosis at the L2-5 levels. IMPRESSION: 1. No acute fracture or static subluxation of the thoracic or lumbar spine. 2. 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 3. Aortic Atherosclerosis (ICD10-I70.0). Electronically Signed   By: Ulyses Jarred M.D.   On: 01/21/2022 02:12   CT Lumbar Spine Wo Contrast  Result Date: 01/21/2022 CLINICAL DATA:  Fall EXAM: CT THORACIC AND LUMBAR SPINE WITHOUT CONTRAST TECHNIQUE: Multidetector CT imaging of the thoracic and lumbar spine was performed without contrast. Multiplanar CT image reconstructions were also generated. RADIATION DOSE REDUCTION: This exam was performed according to the departmental dose-optimization program which includes automated exposure control, adjustment of the mA and/or kV according to patient size and/or use of iterative reconstruction technique. COMPARISON:  None Available. FINDINGS: CT THORACIC SPINE FINDINGS Alignment: Normal. Vertebrae: No acute fracture or focal pathologic process. Spondylosis of the lower thoracic spine. Paraspinal and other soft tissues: Calcific aortic atherosclerosis. Disc levels: No high-grade spinal canal stenosis  CT LUMBAR SPINE FINDINGS Segmentation: 5 lumbar type vertebrae. Alignment: Normal. Vertebrae: No acute fracture or focal pathologic process. Paraspinal and other soft tissues: Negative. Disc levels: There is moderate-to-severe spinal canal stenosis at the L2-5 levels. IMPRESSION: 1. No acute fracture or static subluxation of the thoracic or lumbar spine. 2. 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 3. Aortic Atherosclerosis (ICD10-I70.0). Electronically Signed   By: Ulyses Jarred M.D.   On: 01/21/2022 02:12   CT HEAD WO CONTRAST (5MM)  Result Date: 01/21/2022 CLINICAL DATA:  Fall EXAM: CT HEAD WITHOUT CONTRAST CT CERVICAL SPINE WITHOUT CONTRAST TECHNIQUE: Multidetector CT imaging of the head and cervical spine was performed following the standard protocol without intravenous contrast. Multiplanar CT image reconstructions of the cervical spine were also generated. RADIATION DOSE REDUCTION: This exam was performed according to the departmental dose-optimization program which includes automated exposure control, adjustment of the mA and/or kV according to patient size and/or use of iterative reconstruction technique. COMPARISON:  None Available. FINDINGS: CT HEAD FINDINGS Brain: There is no mass, hemorrhage or extra-axial collection. There is generalized atrophy without lobar predilection. There is hypoattenuation of the periventricular white matter, most commonly indicating chronic ischemic microangiopathy. Vascular: No abnormal hyperdensity of the major intracranial arteries or dural venous sinuses. No intracranial atherosclerosis. Skull: Large left frontal scalp hematoma.  No skull fracture. Sinuses/Orbits: No fluid levels or advanced mucosal thickening of the visualized paranasal sinuses. No mastoid or middle ear effusion. The orbits are normal. CT CERVICAL SPINE FINDINGS Alignment: No static  subluxation. Facets are aligned. Occipital condyles are normally positioned. Skull base and  vertebrae: There is a minimally displaced fracture of the C1 right superior articular process where it contacts the right occipital condyle. There is no other fracture of the cervical spine. Soft tissues and spinal canal: No prevertebral fluid or swelling. No visible canal hematoma. Disc levels: No advanced spinal canal or neural foraminal stenosis. Upper chest: No pneumothorax, pulmonary nodule or pleural effusion. Other: Normal visualized paraspinal cervical soft tissues. IMPRESSION: 1. Minimally displaced fracture of the C1 right superior articular process where it contacts the right occipital condyle. 2. No acute intracranial abnormality. 3. Large left frontal scalp hematoma without skull fracture. These results were called by telephone at the time of interpretation on 01/21/2022 at 2:07 am to provider MADISON [facility] [place] [facility] , who verbally acknowledged these results. Electronically Signed   By: Ulyses Jarred M.D.   On: 01/21/2022 02:07   CT Cervical Spine Wo Contrast  Result Date: 01/21/2022 CLINICAL DATA:  Fall EXAM: CT HEAD WITHOUT CONTRAST CT CERVICAL SPINE WITHOUT CONTRAST TECHNIQUE: Multidetector CT imaging of the head and cervical spine was performed following the standard protocol without intravenous contrast. Multiplanar CT image reconstructions of the cervical spine were also generated. RADIATION DOSE REDUCTION: This exam was performed according to the departmental dose-optimization program which includes automated exposure control, adjustment of the mA and/or kV according to patient size and/or use of iterative reconstruction technique. COMPARISON:  None Available. FINDINGS: CT HEAD FINDINGS Brain: There is no mass, hemorrhage or extra-axial collection. There is generalized atrophy without lobar predilection. There is hypoattenuation of the periventricular white matter, most commonly indicating chronic ischemic microangiopathy. Vascular: No abnormal hyperdensity of the major intracranial arteries or dural  venous sinuses. No intracranial atherosclerosis. Skull: Large left frontal scalp hematoma.  No skull fracture. Sinuses/Orbits: No fluid levels or advanced mucosal thickening of the visualized paranasal sinuses. No mastoid or middle ear effusion. The orbits are normal. CT CERVICAL SPINE FINDINGS Alignment: No static subluxation. Facets are aligned. Occipital condyles are normally positioned. Skull base and vertebrae: There is a minimally displaced fracture of the C1 right superior articular process where it contacts the right occipital condyle. There is no other fracture of the cervical spine. Soft tissues and spinal canal: No prevertebral fluid or swelling. No visible canal hematoma. Disc levels: No advanced spinal canal or neural foraminal stenosis. Upper chest: No pneumothorax, pulmonary nodule or pleural effusion. Other: Normal visualized paraspinal cervical soft tissues. IMPRESSION: 1. Minimally displaced fracture of the C1 right superior articular process where it contacts the right occipital condyle. 2. No acute intracranial abnormality. 3. Large left frontal scalp hematoma without skull fracture. These results were called by telephone at the time of interpretation on 01/21/2022 at 2:07 am to provider MADISON [facility] , who verbally acknowledged these results. Electronically Signed   By: Ulyses Jarred M.D.   On: 01/21/2022 02:07   DG Shoulder Right  Result Date: 01/21/2022 CLINICAL DATA:  Fall, right shoulder pain EXAM: RIGHT SHOULDER - 2+ VIEW COMPARISON:  None Available. FINDINGS: Joints Fe cyst narrowing and spurring in the [facility] and glenohumeral joints. No acute bony abnormality. Specifically, no fracture, subluxation, or dislocation. Old healed right rib fractures. IMPRESSION: No acute bony abnormality. Electronically Signed   By: Rolm Baptise M.D.   On: 01/21/2022 01:43    A/P Darryl Diaz is a 81 y.o. male who fell today sustaining right C1 superior articular process fracture. In collar, should  remain in collar at all times.  Will followup in the office.

## 2022-01-21 NOTE — ED Notes (Signed)
Patient transported to dialysis

## 2022-01-21 NOTE — Progress Notes (Signed)
Unable to get post HD weight.Patient refused to be out/up from  E.D. stretcher bed. H=He is still afraid of moving up secondary to fall that he suffered.ED bed has not built in weight scale.Net UF is 1.8 L on 4 hours treatment.

## 2022-01-21 NOTE — Progress Notes (Signed)
Unable to get  pre HD weight.Patient afraid to get up/out of the bed due to recent fall.Patient ED stretcher does not have built in weight scale. Weight early today on admission 83 kg.

## 2022-01-21 NOTE — ED Notes (Signed)
Sister, Darryl Diaz called and spoke to [place], [place] and stated the bag is in her possession.

## 2022-01-21 NOTE — Consult Note (Signed)
[facility] Renal Consultation Note    Indication for Consultation:  Management of ESRD/hemodialysis; anemia, hypertension/volume and secondary hyperparathyroidism   HPI: Darryl Diaz is a 81 y.o. male with ESRD on HD MWF. PMH also significant for chronic dCHF, pAFib, alcohol use disorder, alcoholic liver cirrhosis, anxiety, recurrent falls. He is admitted under observation status after a fall at home while walking to bed. On arrival to ED found to have large scalp hematoma and minimally displaced fracture of C1 right superior articular process. Cervical collar placed. Neurosurgery consulted- no intervention planned.   Seen and examined in the ED. He is alert and oriented x3. States he did not have any LOC. Does endorses soreness from his injuries. Denies dizziness, HA, nausea/vomiting, cough, chest pain, dyspnea, abdominal pain. His last dialysis was Monday. He missed Wed d/t transportation mixup.   Past Medical History:  Diagnosis Date   Alcohol abuse 02/72/5366   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[place]) 03/17/2020   Alcoholism ([place])    in remission following wife's death   Anemia    Anxiety    Ascending aorta dilation (HCC)    Atrial fibrillation (HCC)    Cardiac arrest ([place]) 04/20/2019   12 min CPR with epi   Chronic diastolic CHF (congestive heart failure) ([place])    Dysrhythmia    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[place])    Mon Wed Fri   Fatty liver    Fracture of hip ([place]) 03/30/2016   Gastritis    GERD (gastroesophageal reflux disease)    H/O seasonal allergies    Heme positive stool 05/14/2020   History of blood transfusion    History of GI bleed    Hx of adenomatous colonic polyps 08/10/2017   Hyperlipidemia    Hypertension    Insomnia    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   Mallory-Weiss tear    OA (osteoarthritis)    hands   OSA (obstructive sleep apnea)    No longer uses CPAP   Persistent atrial fibrillation with rapid  ventricular response ([place]) 02/05/2020   Posterior dislocation of hip, closed ([place]) 04/30/2016   Recurrent syncope    Restless legs syndrome (RLS)    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([place])    Past Surgical History:  Procedure Laterality Date   AMPUTATION Left 05/19/2021   Procedure: LEFT GREAT TOE AMPUTATION;  Surgeon: Newt Minion, MD;  Location: [place];  Service: Orthopedics;  Laterality: Left;   ANKLE SURGERY Left 2014   had rods put in    AV FISTULA PLACEMENT Left 04/21/2021   Procedure: LEFT ARM ARTERIOVENOUS (AV) FISTULA CREATION - Left brachiocephalic fistula;  Surgeon: Marty Heck, MD;  Location: [place];  Service: Vascular;  Laterality: Left;   BIOPSY  02/28/2019   Procedure: BIOPSY;  Surgeon: Milus Banister, MD;  Location: [place];  Service: Endoscopy;;   CARDIOVERSION  02/06/2020   CARDIOVERSION N/A 02/06/2020   Procedure: CARDIOVERSION;  Surgeon: Werner Lean, MD;  Location: [place] ENDOSCOPY;  Service: Cardiovascular;  Laterality: N/A;   CARDIOVERSION N/A 02/24/2020   Procedure: CARDIOVERSION;  Surgeon: Werner Lean, MD;  Location: [place];  Service: Cardiovascular;  Laterality: N/A;   COLONOSCOPY     [place]   COLONOSCOPY WITH PROPOFOL N/A 01/08/2021   Procedure: COLONOSCOPY WITH PROPOFOL;  Surgeon: Milus Banister, MD;  Location[facility]OSCOPY;  Service: Endoscopy;  Laterality: N/A;   ESOPHAGOGASTRODUODENOSCOPY (EGD) WITH PROPOFOL N/A 02/28/2019   Procedure: ESOPHAGOGASTRODUODENOSCOPY (EGD) WITH  PROPOFOL;  Surgeon: Milus Banister, MD;  Location: [facility] ENDOSCOPY;  Service: Endoscopy;  Laterality: N/A;   FRACTURE SURGERY     HIP ARTHROPLASTY Left 04/01/2016   Procedure: ARTHROPLASTY BIPOLAR HIP (HEMIARTHROPLASTY);  Surgeon: Paralee Cancel, MD;  Location: [place];  Service: Orthopedics;  Laterality: Left;   HIP CLOSED REDUCTION Left 04/30/2016   Procedure: CLOSED REDUCTION HIP;  Surgeon: Wylene Simmer, MD;  Location: [place];  Service: Orthopedics;   Laterality: Left;   HOT HEMOSTASIS N/A 01/08/2021   Procedure: HOT HEMOSTASIS (ARGON PLASMA COAGULATION/BICAP);  Surgeon: Milus Banister, MD;  Location: [facility] ENDOSCOPY;  Service: Endoscopy;  Laterality: N/A;   IR RADIOLOGIST EVAL & MGMT  12/31/2020   JOINT REPLACEMENT     TONSILLECTOMY     TOTAL HIP ARTHROPLASTY Right 10/15/2019   Procedure: TOTAL HIP ARTHROPLASTY ANTERIOR APPROACH;  Surgeon: Paralee Cancel, MD;  Location: [facility];  Service: Orthopedics;  Laterality: Right;  70 mins   VASCULAR SURGERY     Family History  Problem Relation Age of Onset   Heart disease Mother 68   Hypertension Mother    Arthritis Mother    Heart failure Father 33   Stroke Maternal Aunt    Heart failure Sister    Colon cancer Neg Hx    Esophageal cancer Neg Hx    Stomach cancer Neg Hx    Rectal cancer Neg Hx    Social History:  reports that he has quit smoking. His smoking use included cigarettes. He has a 20.00 pack-year smoking history. He has never used smokeless tobacco. He reports that he does not currently use alcohol after a past usage of about 3.0 standard drinks of alcohol per week. He reports that he does not use drugs. Allergies  Allergen Reactions   Penicillin G Sodium Rash and Other (See Comments)     (tolerates Keflex)   Penicillins Rash   Prior to Admission medications   Medication Sig Start Date End Date Taking? Authorizing Provider  amiodarone (PACERONE) 200 MG tablet TAKE 1 TABLET BY MOUTH ONCE DAILY Patient taking differently: Take 200 mg by mouth every morning. 05/17/21  Yes Weaver, Scott T, PA-C  atorvastatin (LIPITOR) 20 MG tablet Take 1 tablet (20 mg total) by mouth daily. 03/10/20  Yes Angiulli, Lavon Paganini, PA-C  carvedilol (COREG) 6.25 MG tablet Take 1 tablet (6.25 mg total) by mouth 2 (two) times daily. Patient taking differently: Take 12.5 mg by mouth daily. 02/11/21  Yes Sherren Mocha, MD  cetirizine (ZYRTEC) 10 MG tablet Take 10 mg by mouth daily.   Yes [provider]  escitalopram (LEXAPRO) 10 MG tablet Take 1 tablet (10 mg total) by mouth daily. Patient taking differently: Take 10 mg by mouth every morning. 03/10/20  Yes Angiulli, Lavon Paganini, PA-C  ferrous sulfate 324 MG TBEC Take 324 mg by mouth every morning.   Yes [provider]  FOLIC ACID PO Take 1 tablet by mouth every morning.   Yes [provider]  Magnesium Oxide 400 MG CAPS Take 1 capsule (400 mg total) by mouth daily. 10/17/20  Yes Eugenie Filler, MD  Multiple Vitamin (MULTIVITAMIN PO) Take 1 tablet by mouth every other day.   Yes [provider]  nitroGLYCERIN (NITROSTAT) 0.4 MG SL tablet Place 1 tablet (0.4 mg total) under the tongue every 5 (five) minutes x 3 doses as needed for chest pain. 07/28/20  Yes Baldwin Jamaica, PA-C  pantoprazole (PROTONIX) 40 MG tablet Take 1 tablet (40 mg  total) by mouth daily. Patient taking differently: Take 40 mg by mouth every morning. 03/10/20  Yes Angiulli, Lavon Paganini, PA-C  sodium bicarbonate 650 MG tablet Take 650 mg by mouth daily.   Yes [provider]  tamsulosin (FLOMAX) 0.4 MG CAPS capsule Take 0.4 mg by mouth at bedtime.   Yes [provider]  torsemide (DEMADEX) 20 MG tablet Take 60 mg by mouth daily.   Yes [provider]  traZODone (DESYREL) 50 MG tablet Take 1 tablet (50 mg total) by mouth at bedtime. 11/26/21  Yes Alma Friendly, MD   Current Facility-Administered Medications  Medication Dose Route Frequency Provider Last Rate Last Admin   Chlorhexidine Gluconate Cloth 2 % PADS 6 each  6 each Topical Q0600 Lynnda Child, PA-C       folic acid (FOLVITE) tablet 1 mg  1 mg Oral Daily Shela Leff, MD   1 mg at 01/21/22 4696   HYDROmorphone (DILAUDID) injection 0.5 mg  0.5 mg Intravenous Q4H PRN Shela Leff, MD       multivitamin with minerals tablet 1 tablet  1 tablet Oral Daily Shela Leff, MD   1 tablet at 01/21/22 2952   thiamine (VITAMIN B1) tablet 100 mg   100 mg Oral Daily Shela Leff, MD   100 mg at 01/21/22 8413   Or   thiamine (VITAMIN B1) injection 100 mg  100 mg Intravenous Daily Shela Leff, MD       Current Outpatient Medications  Medication Sig Dispense Refill   amiodarone (PACERONE) 200 MG tablet TAKE 1 TABLET BY MOUTH ONCE DAILY (Patient taking differently: Take 200 mg by mouth every morning.) 90 tablet 2   atorvastatin (LIPITOR) 20 MG tablet Take 1 tablet (20 mg total) by mouth daily. 30 tablet 0   carvedilol (COREG) 6.25 MG tablet Take 1 tablet (6.25 mg total) by mouth 2 (two) times daily. (Patient taking differently: Take 12.5 mg by mouth daily.) 180 tablet 3   cetirizine (ZYRTEC) 10 MG tablet Take 10 mg by mouth daily.     escitalopram (LEXAPRO) 10 MG tablet Take 1 tablet (10 mg total) by mouth daily. (Patient taking differently: Take 10 mg by mouth every morning.) 30 tablet 0   ferrous sulfate 324 MG TBEC Take 324 mg by mouth every morning.     FOLIC ACID PO Take 1 tablet by mouth every morning.     Magnesium Oxide 400 MG CAPS Take 1 capsule (400 mg total) by mouth daily.     Multiple Vitamin (MULTIVITAMIN PO) Take 1 tablet by mouth every other day.     nitroGLYCERIN (NITROSTAT) 0.4 MG SL tablet Place 1 tablet (0.4 mg total) under the tongue every 5 (five) minutes x 3 doses as needed for chest pain. 30 tablet 12   pantoprazole (PROTONIX) 40 MG tablet Take 1 tablet (40 mg total) by mouth daily. (Patient taking differently: Take 40 mg by mouth every morning.) 30 tablet 0   sodium bicarbonate 650 MG tablet Take 650 mg by mouth daily.     tamsulosin (FLOMAX) 0.4 MG CAPS capsule Take 0.4 mg by mouth at bedtime.     torsemide (DEMADEX) 20 MG tablet Take 60 mg by mouth daily.     traZODone (DESYREL) 50 MG tablet Take 1 tablet (50 mg total) by mouth at bedtime. 30 tablet 0     ROS: As per HPI otherwise negative.  Physical Exam: Vitals:   01/21/22 0730 01/21/22 0845 01/21/22 0900 01/21/22 1033  BP: Marland Kitchen)  128/56 (!)  123/51 (!) 121/53   Pulse: 62 62 (!) 59   Resp:  13 17   Temp:    97.7 F (36.5 C)  TempSrc:      SpO2: 100% 92% 94%   Weight:      Height:         General: Elderly man, no acute distress  Head: Large scalp/forehead laceration; left eye bruising  Neck: in cervical collar  Lungs: Clear bilaterally without wheezes, rales, or rhonchi. Normal WOB  Heart: RRR, no murmur, rub, or gallop  Abdomen: soft non-tender, bowel sounds normal, no masses  Lower extremities:without edema or ischemic changes, no open wounds  Neuro: A & O X 3. Moves all extremities spontaneously. Psych:  Responds to questions appropriately with a normal affect. Dialysis Access: LUE AVF +bruit; bruising to upper L arm   Labs: Basic Metabolic Panel: Recent Labs  Lab 01/21/22 0027 01/21/22 0911  NA 133* 134*  K 3.4* 4.0  CL 96* 97*  CO2 23 27  GLUCOSE 88 93  BUN 37* 36*  CREATININE 3.35* 3.32*  CALCIUM 9.3 9.0   Liver Function Tests: Recent Labs  Lab 01/21/22 0027  AST 289*  ALT 107*  ALKPHOS 107  BILITOT 1.3*  PROT 5.6*  ALBUMIN 3.3*   No results for input(s): "LIPASE", "AMYLASE" in the last 168 hours. No results for input(s): "AMMONIA" in the last 168 hours. CBC: Recent Labs  Lab 01/21/22 0027 01/21/22 0911  WBC 4.4 4.2  NEUTROABS 2.8  --   HGB 8.8* 8.5*  HCT 25.2* 25.9*  MCV 103.3* 108.4*  PLT 99* 99*   Cardiac Enzymes: No results for input(s): "CKTOTAL", "CKMB", "CKMBINDEX", "TROPONINI" in the last 168 hours. CBG: No results for input(s): "GLUCAP" in the last 168 hours. Iron Studies: No results for input(s): "IRON", "TIBC", "TRANSFERRIN", "FERRITIN" in the last 72 hours. Studies/Results: CT Thoracic Spine Wo Contrast  Result Date: 01/21/2022 CLINICAL DATA:  Fall EXAM: CT THORACIC AND LUMBAR SPINE WITHOUT CONTRAST TECHNIQUE: Multidetector CT imaging of the thoracic and lumbar spine was performed without contrast. Multiplanar CT image reconstructions were also generated. RADIATION DOSE  REDUCTION: This exam was performed according to the departmental dose-optimization program which includes automated exposure control, adjustment of the mA and/or kV according to patient size and/or use of iterative reconstruction technique. COMPARISON:  None Available. FINDINGS: CT THORACIC SPINE FINDINGS Alignment: Normal. Vertebrae: No acute fracture or focal pathologic process. Spondylosis of the lower thoracic spine. Paraspinal and other soft tissues: Calcific aortic atherosclerosis. Disc levels: No high-grade spinal canal stenosis CT LUMBAR SPINE FINDINGS Segmentation: 5 lumbar type vertebrae. Alignment: Normal. Vertebrae: No acute fracture or focal pathologic process. Paraspinal and other soft tissues: Negative. Disc levels: There is moderate-to-severe spinal canal stenosis at the L2-5 levels. IMPRESSION: 1. No acute fracture or static subluxation of the thoracic or lumbar spine. 2. 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 3. Aortic Atherosclerosis (ICD10-I70.0). Electronically Signed   By: Ulyses Jarred M.D.   On: 01/21/2022 02:12   CT Lumbar Spine Wo Contrast  Result Date: 01/21/2022 CLINICAL DATA:  Fall EXAM: CT THORACIC AND LUMBAR SPINE WITHOUT CONTRAST TECHNIQUE: Multidetector CT imaging of the thoracic and lumbar spine was performed without contrast. Multiplanar CT image reconstructions were also generated. RADIATION DOSE REDUCTION: This exam was performed according to the departmental dose-optimization program which includes automated exposure control, adjustment of the mA and/or kV according to patient size and/or use of iterative reconstruction technique. COMPARISON:  None Available. FINDINGS:  CT THORACIC SPINE FINDINGS Alignment: Normal. Vertebrae: No acute fracture or focal pathologic process. Spondylosis of the lower thoracic spine. Paraspinal and other soft tissues: Calcific aortic atherosclerosis. Disc levels: No high-grade spinal canal stenosis CT LUMBAR SPINE FINDINGS  Segmentation: 5 lumbar type vertebrae. Alignment: Normal. Vertebrae: No acute fracture or focal pathologic process. Paraspinal and other soft tissues: Negative. Disc levels: There is moderate-to-severe spinal canal stenosis at the L2-5 levels. IMPRESSION: 1. No acute fracture or static subluxation of the thoracic or lumbar spine. 2. 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 3. Aortic Atherosclerosis (ICD10-I70.0). Electronically Signed   By: Ulyses Jarred M.D.   On: 01/21/2022 02:12   CT HEAD WO CONTRAST (5MM)  Result Date: 01/21/2022 CLINICAL DATA:  Fall EXAM: CT HEAD WITHOUT CONTRAST CT CERVICAL SPINE WITHOUT CONTRAST TECHNIQUE: Multidetector CT imaging of the head and cervical spine was performed following the standard protocol without intravenous contrast. Multiplanar CT image reconstructions of the cervical spine were also generated. RADIATION DOSE REDUCTION: This exam was performed according to the departmental dose-optimization program which includes automated exposure control, adjustment of the mA and/or kV according to patient size and/or use of iterative reconstruction technique. COMPARISON:  None Available. FINDINGS: CT HEAD FINDINGS Brain: There is no mass, hemorrhage or extra-axial collection. There is generalized atrophy without lobar predilection. There is hypoattenuation of the periventricular white matter, most commonly indicating chronic ischemic microangiopathy. Vascular: No abnormal hyperdensity of the major intracranial arteries or dural venous sinuses. No intracranial atherosclerosis. Skull: Large left frontal scalp hematoma.  No skull fracture. Sinuses/Orbits: No fluid levels or advanced mucosal thickening of the visualized paranasal sinuses. No mastoid or middle ear effusion. The orbits are normal. CT CERVICAL SPINE FINDINGS Alignment: No static subluxation. Facets are aligned. Occipital condyles are normally positioned. Skull base and vertebrae: There is a minimally  displaced fracture of the C1 right superior articular process where it contacts the right occipital condyle. There is no other fracture of the cervical spine. Soft tissues and spinal canal: No prevertebral fluid or swelling. No visible canal hematoma. Disc levels: No advanced spinal canal or neural foraminal stenosis. Upper chest: No pneumothorax, pulmonary nodule or pleural effusion. Other: Normal visualized paraspinal cervical soft tissues. IMPRESSION: 1. Minimally displaced fracture of the C1 right superior articular process where it contacts the right occipital condyle. 2. No acute intracranial abnormality. 3. Large left frontal scalp hematoma without skull fracture. These results were called by telephone at the time of interpretation on 01/21/2022 at 2:07 am to provider M[place]ISON [facility] , who verbally acknowledged these results. Electronically Signed   By: Ulyses Jarred M.D.   On: 01/21/2022 02:07   CT Cervical Spine Wo Contrast  Result Date: 01/21/2022 CLINICAL DATA:  Fall EXAM: CT HEAD WITHOUT CONTRAST CT CERVICAL SPINE WITHOUT CONTRAST TECHNIQUE: Multidetector CT imaging of the head and cervical spine was performed following the standard protocol without intravenous contrast. Multiplanar CT image reconstructions of the cervical spine were also generated. RADIATION DOSE REDUCTION: This exam was performed according to the departmental dose-optimization program which includes automated exposure control, adjustment of the mA and/or kV according to patient size and/or use of iterative reconstruction technique. COMPARISON:  None Available. FINDINGS: CT HEAD FINDINGS Brain: There is no mass, hemorrhage or extra-axial collection. There is generalized atrophy without lobar predilection. There is hypoattenuation of the periventricular white matter, most commonly indicating chronic ischemic microangiopathy. Vascular: No abnormal hyperdensity of the major intracranial arteries or dural venous sinuses. No intracranial  atherosclerosis. Skull: Large left  frontal scalp hematoma.  No skull fracture. Sinuses/Orbits: No fluid levels or advanced mucosal thickening of the visualized paranasal sinuses. No mastoid or middle ear effusion. The orbits are normal. CT CERVICAL SPINE FINDINGS Alignment: No static subluxation. Facets are aligned. Occipital condyles are normally positioned. Skull base and vertebrae: There is a minimally displaced fracture of the C1 right superior articular process where it contacts the right occipital condyle. There is no other fracture of the cervical spine. Soft tissues and spinal canal: No prevertebral fluid or swelling. No visible canal hematoma. Disc levels: No advanced spinal canal or neural foraminal stenosis. Upper chest: No pneumothorax, pulmonary nodule or pleural effusion. Other: Normal visualized paraspinal cervical soft tissues. IMPRESSION: 1. Minimally displaced fracture of the C1 right superior articular process where it contacts the right occipital condyle. 2. No acute intracranial abnormality. 3. Large left frontal scalp hematoma without skull fracture. These results were called by telephone at the time of interpretation on 01/21/2022 at 2:07 am to provider M[place]ISON [facility] , who verbally acknowledged these results. Electronically Signed   By: Ulyses Jarred M.D.   On: 01/21/2022 02:07   DG Shoulder Right  Result Date: 01/21/2022 CLINICAL DATA:  Fall, right shoulder pain EXAM: RIGHT SHOULDER - 2+ VIEW COMPARISON:  None Available. FINDINGS: Joints Fe cyst narrowing and spurring in the [facility] and glenohumeral joints. No acute bony abnormality. Specifically, no fracture, subluxation, or dislocation. Old healed right rib fractures. IMPRESSION: No acute bony abnormality. Electronically Signed   By: Rolm Baptise M.D.   On: 01/21/2022 01:43    Dialysis Orders:  Unit: AF MWF Time: 4:00  EDW: 83.5kg  Flows: 400 A1.5x  [place]: 2K/2Ca  Access: AVF  Heparin: None Mircera 50 q 2 wks (last 9/4) VDRA: None    Assessment/Plan: C1 fracture/scalp hematoma d/t fall at home: Management per primary team. Lives alone with hx of recurrent falls. Likely needs SNF placement  ESRD -  HD MWF. Continue on schedule. HD today   Hypertension/volume  - BP/volume acceptable. UF to EDW as able   Anemia  - Hgb 8.8. Next ESA due 2/11  Metabolic bone disease -  Ca acceptable. Check phos if remains in hospital. No VDRA.   Nutrition - Renal diet with fluid restriction  Lynnda Child PA-C [facility] 01/21/2022, 11:13 AM

## 2022-01-21 NOTE — H&P (Signed)
History and Physical    Darryl Diaz OYD:[id number] DOB: [birth year] DOA: 01/21/2022  PCP: [name], MD  Patient coming from: Home  Chief Complaint: Fall  HPI: Darryl Diaz is a 81 y.o. male with medical history significant of ESRD on HD MWF, chronic diastolic CHF, paroxysmal A-fib not on anticoagulation due to recurrent falls, tachy-brady syndrome, hypertension, hyperlipidemia, anxiety/depression, GERD, chronic pancytopenia, history of GI bleed, alcohol abuse, alcoholic liver cirrhosis, OSA not on CPAP, history of cardiac arrest in 2020. Patient lives alone at home and presented to the ED tonight via EMS after he had a fall while attempting to get in bed.  Had a forehead hematoma and laceration which was repaired in the ED.  Labs showing hemoglobin 8.8 (no significant change from baseline), platelet count 99k, potassium 3.4, bicarb 23, AST 289, ALT 107, no significant elevation of alkaline phosphatase or total bilirubin, blood ethanol level 115.  CT C-spine showing minimally displaced fracture of the C1 right superior articular process.  CT head showing large left frontal scalp hematoma without skull fracture or any acute intracranial abnormality.  Remainder of trauma scans including x-ray of right shoulder and CT of lumbar and thoracic spine negative for acute traumatic injuries. Patient was given Dilaudid, morphine, Zofran, and 1 L LR bolus.  Placed in [place] J collar and neurosurgery consulted (Dr. Christella Noa).  Patient states he is having problems with his balance and keeps falling.  He drinks 2 glasses of wine every night.  Tonight he fell again and injured his head.  Complaining of neck pain but denies pain anywhere else.  States he is currently living alone and is planning on moving to an assisted living facility in 2 weeks.  Denies headaches, nausea, vomiting, cough, shortness of breath, chest pain, or abdominal pain.  No other complaints.  Review of Systems:  Review of Systems   All other systems reviewed and are negative.   Past Medical History:  Diagnosis Date   Alcohol abuse 67/20/9470   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[place]) 03/17/2020   Alcoholism ([place])    in remission following wife's death   Anemia    Anxiety    Ascending aorta dilation (HCC)    Atrial fibrillation (HCC)    Cardiac arrest ([place]) 04/20/2019   12 min CPR with epi   Chronic diastolic CHF (congestive heart failure) ([place])    Dysrhythmia    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[place])    Mon Wed Fri   Fatty liver    Fracture of hip ([place]) 03/30/2016   Gastritis    GERD (gastroesophageal reflux disease)    H/O seasonal allergies    Heme positive stool 05/14/2020   History of blood transfusion    History of GI bleed    Hx of adenomatous colonic polyps 08/10/2017   Hyperlipidemia    Hypertension    Insomnia    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   Mallory-Weiss tear    OA (osteoarthritis)    hands   OSA (obstructive sleep apnea)    No longer uses CPAP   Persistent atrial fibrillation with rapid ventricular response ([place]) 02/05/2020   Posterior dislocation of hip, closed ([place]) 04/30/2016   Recurrent syncope    Restless legs syndrome (RLS)    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([place])     Past Surgical History:  Procedure Laterality Date   AMPUTATION Left 05/19/2021   Procedure: LEFT GREAT TOE AMPUTATION;  Surgeon: Newt Minion, MD;  Location:  [facility];  Service: Orthopedics;  Laterality: Left;   ANKLE SURGERY Left 2014   had rods put in    AV FISTULA PLACEMENT Left 04/21/2021   Procedure: LEFT ARM ARTERIOVENOUS (AV) FISTULA CREATION - Left brachiocephalic fistula;  Surgeon: Marty Heck, MD;  Location: [facility];  Service: Vascular;  Laterality: Left;   BIOPSY  02/28/2019   Procedure: BIOPSY;  Surgeon: Milus Banister, MD;  Location: [facility];  Service: Endoscopy;;   CARDIOVERSION  02/06/2020   CARDIOVERSION N/A 02/06/2020   Procedure: CARDIOVERSION;  Surgeon:  Werner Lean, MD;  Location: [facility];  Service: Cardiovascular;  Laterality: N/A;   CARDIOVERSION N/A 02/24/2020   Procedure: CARDIOVERSION;  Surgeon: Werner Lean, MD;  Location: [facility];  Service: Cardiovascular;  Laterality: N/A;   COLONOSCOPY     [place]   COLONOSCOPY WITH PROPOFOL N/A 01/08/2021   Procedure: COLONOSCOPY WITH PROPOFOL;  Surgeon: Milus Banister, MD;  Location[facility] ENDOSCOPY;  Service: Endoscopy;  Laterality: N/A;   ESOPHAGOGASTRODUODENOSCOPY (EGD) WITH PROPOFOL N/A 02/28/2019   Procedure: ESOPHAGOGASTRODUODENOSCOPY (EGD) WITH PROPOFOL;  Surgeon: Milus Banister, MD;  Location: [facility] ENDOSCOPY;  Service: Endoscopy;  Laterality: N/A;   FRACTURE SURGERY     HIP ARTHROPLASTY Left 04/01/2016   Procedure: ARTHROPLASTY BIPOLAR HIP (HEMIARTHROPLASTY);  Surgeon: Paralee Cancel, MD;  Location: [place] [facility];  Service: Orthopedics;  Laterality: Left;   HIP CLOSED REDUCTION Left 04/30/2016   Procedure: CLOSED REDUCTION HIP;  Surgeon: Wylene Simmer, MD;  Location: [facility];  Service: Orthopedics;  Laterality: Left;   HOT HEMOSTASIS N/A 01/08/2021   Procedure: HOT HEMOSTASIS (ARGON PLASMA COAGULATION/BICAP);  Surgeon: Milus Banister, MD;  Location[facility] END[facility]COPY;  Service: Endoscopy;  Laterality: N/A;   IR RADIOLOGIST EVAL & MGMT  12/31/2020   JOINT REPLACEMENT     TONSILLECTOMY     TOTAL HIP ARTHROPLASTY Right 10/15/2019   Procedure: TOTAL HIP ARTHROPLASTY ANTERIOR APPROACH;  Surgeon: Paralee Cancel, MD;  Location: [facility];  Service: Orthopedics;  Laterality: Right;  70 mins   VASCULAR SURGERY       reports that he has quit smoking. His smoking use included cigarettes. He has a 20.00 pack-year smoking history. He has never used smokeless tobacco. He reports that he does not currently use alcohol after a past usage of about 3.0 standard drinks of alcohol per week. He reports that he does not use drugs.  Allergies  Allergen Reactions   Penicillin G Sodium Rash and  Other (See Comments)     (tolerates Keflex)   Penicillins Rash    Family History  Problem Relation Age of Onset   Heart disease Mother 79   Hypertension Mother    Arthritis Mother    Heart failure Father 66   Stroke Maternal Aunt    Heart failure Sister    Colon cancer Neg Hx    Esophageal cancer Neg Hx    Stomach cancer Neg Hx    Rectal cancer Neg Hx     Prior to Admission medications   Medication Sig Start Date End Date Taking? Authorizing Provider  amiodarone (PACERONE) 200 MG tablet TAKE 1 TABLET BY MOUTH ONCE DAILY Patient taking differently: Take 200 mg by mouth every morning. 05/17/21   Richardson Dopp T, PA-C  atorvastatin (LIPITOR) 20 MG tablet Take 1 tablet (20 mg total) by mouth daily. 03/10/20   Angiulli, Lavon Paganini, PA-C  carvedilol (COREG) 6.25 MG tablet Take 1 tablet (6.25 mg total) by mouth 2 (two) times daily. Patient  taking differently: Take 12.5 mg by mouth daily. 02/11/21   Sherren Mocha, MD  cetirizine (ZYRTEC) 10 MG tablet Take 10 mg by mouth daily.    [provider]  escitalopram (LEXAPRO) 10 MG tablet Take 1 tablet (10 mg total) by mouth daily. Patient taking differently: Take 10 mg by mouth every morning. 03/10/20   Angiulli, Lavon Paganini, PA-C  ferrous sulfate 324 MG TBEC Take 324 mg by mouth every morning.    [provider]  FOLIC ACID PO Take 1 tablet by mouth every morning.    [provider]  Magnesium Oxide 400 MG CAPS Take 1 capsule (400 mg total) by mouth daily. 10/17/20   Eugenie Filler, MD  Multiple Vitamin (MULTIVITAMIN PO) Take 1 tablet by mouth every other day.    [provider]  nitroGLYCERIN (NITROSTAT) 0.4 MG SL tablet Place 1 tablet (0.4 mg total) under the tongue every 5 (five) minutes x 3 doses as needed for chest pain. 07/28/20   Baldwin Jamaica, PA-C  pantoprazole (PROTONIX) 40 MG tablet Take 1 tablet (40 mg total) by mouth daily. Patient taking differently: Take 40 mg by mouth every morning. 03/10/20    Angiulli, Lavon Paganini, PA-C  sodium bicarbonate 650 MG tablet Take 650 mg by mouth daily.    [provider]  tamsulosin (FLOMAX) 0.4 MG CAPS capsule Take 0.4 mg by mouth at bedtime.    [provider]  torsemide (DEMADEX) 20 MG tablet Take 60 mg by mouth daily.    [provider]  traZODone (DESYREL) 50 MG tablet Take 1 tablet (50 mg total) by mouth at bedtime. 11/26/21   Alma Friendly, MD    Physical Exam: Vitals:   01/21/22 0030 01/21/22 0032 01/21/22 0033 01/21/22 0345  BP: (!) 161/68   125/67  Pulse: 69   66  Resp: (!) 27   (!) 21  Temp:  97.7 F (36.5 C)    TempSrc:  Oral    SpO2: 100%   100%  Weight:   83 kg   Height:   6' (1.829 m)     Physical Exam Vitals reviewed.  Constitutional:      General: He is not in acute distress. HENT:     Head: Normocephalic.     Comments: Left forehead hematoma with laceration Eyes:     Extraocular Movements: Extraocular movements intact.  Cardiovascular:     Rate and Rhythm: Normal rate and regular rhythm.     Pulses: Normal pulses.  Pulmonary:     Effort: Pulmonary effort is normal. No respiratory distress.     Breath sounds: Normal breath sounds. No wheezing or rales.  Abdominal:     General: Bowel sounds are normal.     Palpations: Abdomen is soft.     Tenderness: There is no abdominal tenderness.  Musculoskeletal:     Cervical back: Normal range of motion.     Right lower leg: Edema present.     Left lower leg: Edema present.     Comments: +1 pitting edema of bilateral lower extremities  Skin:    General: Skin is warm and dry.  Neurological:     General: No focal deficit present.     Mental Status: He is alert and oriented to person, place, and time.     Cranial Nerves: No cranial nerve deficit.     Sensory: No sensory deficit.     Motor: No weakness.      Labs on Admission: I  have personally reviewed following labs and imaging studies  CBC: Recent Labs  Lab 01/21/22 0027  WBC 4.4   NEUTROABS 2.8  HGB 8.8*  HCT 25.2*  MCV 103.3*  PLT 99*   Basic Metabolic Panel: Recent Labs  Lab 01/21/22 0027  NA 133*  K 3.4*  CL 96*  CO2 23  GLUCOSE 88  BUN 37*  CREATININE 3.35*  CALCIUM 9.3   GFR: Estimated Creatinine Clearance: 19.3 mL/min (A) (by C-G formula based on SCr of 3.35 mg/dL (H)). Liver Function Tests: Recent Labs  Lab 01/21/22 0027  AST 289*  ALT 107*  ALKPHOS 107  BILITOT 1.3*  PROT 5.6*  ALBUMIN 3.3*   No results for input(s): "LIPASE", "AMYLASE" in the last 168 hours. No results for input(s): "AMMONIA" in the last 168 hours. Coagulation Profile: No results for input(s): "INR", "PROTIME" in the last 168 hours. Cardiac Enzymes: No results for input(s): "CKTOTAL", "CKMB", "CKMBINDEX", "TROPONINI" in the last 168 hours. BNP (last 3 results) No results for input(s): "PROBNP" in the last 8760 hours. HbA1C: No results for input(s): "HGBA1C" in the last 72 hours. CBG: No results for input(s): "GLUCAP" in the last 168 hours. Lipid Profile: No results for input(s): "CHOL", "HDL", "LDLCALC", "TRIG", "CHOLHDL", "LDLDIRECT" in the last 72 hours. Thyroid Function Tests: No results for input(s): "TSH", "T4TOTAL", "FREET4", "T3FREE", "THYROIDAB" in the last 72 hours. Anemia Panel: No results for input(s): "VITAMINB12", "FOLATE", "FERRITIN", "TIBC", "IRON", "RETICCTPCT" in the last 72 hours. Urine analysis:    Component Value Date/Time   COLORURINE YELLOW 12/03/2021 [place] 12/03/2021 0651   LABSPEC 1.005 12/03/2021 0651   PHURINE 8.0 12/03/2021 0651   GLUCOSEU NEGATIVE 12/03/2021 0651   HGBUR SMALL (A) 12/03/2021 0651   BILIRUBINUR NEGATIVE 12/03/2021 0651   KETONESUR NEGATIVE 12/03/2021 0651   PROTEINUR NEGATIVE 12/03/2021 0651   NITRITE NEGATIVE 12/03/2021 0651   LEUKOCYTESUR NEGATIVE 12/03/2021 0651    Radiological Exams on Admission: I have personally reviewed images CT Thoracic Spine Wo Contrast  Result Date:  01/21/2022 CLINICAL DATA:  Fall EXAM: CT THORACIC AND LUMBAR SPINE WITHOUT CONTRAST TECHNIQUE: Multidetector CT imaging of the thoracic and lumbar spine was performed without contrast. Multiplanar CT image reconstructions were also generated. RADIATION DOSE REDUCTION: This exam was performed according to the departmental dose-optimization program which includes automated exposure control, adjustment of the mA and/or kV according to patient size and/or use of iterative reconstruction technique. COMPARISON:  None Available. FINDINGS: CT THORACIC SPINE FINDINGS Alignment: Normal. Vertebrae: No acute fracture or focal pathologic process. Spondylosis of the lower thoracic spine. Paraspinal and other soft tissues: Calcific aortic atherosclerosis. Disc levels: No high-grade spinal canal stenosis CT LUMBAR SPINE FINDINGS Segmentation: 5 lumbar type vertebrae. Alignment: Normal. Vertebrae: No acute fracture or focal pathologic process. Paraspinal and other soft tissues: Negative. Disc levels: There is moderate-to-severe spinal canal stenosis at the L2-5 levels. IMPRESSION: 1. No acute fracture or static subluxation of the thoracic or lumbar spine. 2. 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 3. Aortic Atherosclerosis (ICD10-I70.0). Electronically Signed   By: Ulyses Jarred M.D.   On: 01/21/2022 02:12   CT Lumbar Spine Wo Contrast  Result Date: 01/21/2022 CLINICAL DATA:  Fall EXAM: CT THORACIC AND LUMBAR SPINE WITHOUT CONTRAST TECHNIQUE: Multidetector CT imaging of the thoracic and lumbar spine was performed without contrast. Multiplanar CT image reconstructions were also generated. RADIATION DOSE REDUCTION: This exam was performed according to the departmental dose-optimization program which includes automated exposure control, adjustment of  the mA and/or kV according to patient size and/or use of iterative reconstruction technique. COMPARISON:  None Available. FINDINGS: CT THORACIC SPINE FINDINGS  Alignment: Normal. Vertebrae: No acute fracture or focal pathologic process. Spondylosis of the lower thoracic spine. Paraspinal and other soft tissues: Calcific aortic atherosclerosis. Disc levels: No high-grade spinal canal stenosis CT LUMBAR SPINE FINDINGS Segmentation: 5 lumbar type vertebrae. Alignment: Normal. Vertebrae: No acute fracture or focal pathologic process. Paraspinal and other soft tissues: Negative. Disc levels: There is moderate-to-severe spinal canal stenosis at the L2-5 levels. IMPRESSION: 1. No acute fracture or static subluxation of the thoracic or lumbar spine. 2. 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 3. Aortic Atherosclerosis (ICD10-I70.0). Electronically Signed   By: Ulyses Jarred M.D.   On: 01/21/2022 02:12   CT HEAD WO CONTRAST (5MM)  Result Date: 01/21/2022 CLINICAL DATA:  Fall EXAM: CT HEAD WITHOUT CONTRAST CT CERVICAL SPINE WITHOUT CONTRAST TECHNIQUE: Multidetector CT imaging of the head and cervical spine was performed following the standard protocol without intravenous contrast. Multiplanar CT image reconstructions of the cervical spine were also generated. RADIATION DOSE REDUCTION: This exam was performed according to the departmental dose-optimization program which includes automated exposure control, adjustment of the mA and/or kV according to patient size and/or use of iterative reconstruction technique. COMPARISON:  None Available. FINDINGS: CT HEAD FINDINGS Brain: There is no mass, hemorrhage or extra-axial collection. There is generalized atrophy without lobar predilection. There is hypoattenuation of the periventricular white matter, most commonly indicating chronic ischemic microangiopathy. Vascular: No abnormal hyperdensity of the major intracranial arteries or dural venous sinuses. No intracranial atherosclerosis. Skull: Large left frontal scalp hematoma.  No skull fracture. Sinuses/Orbits: No fluid levels or advanced mucosal thickening of the  visualized paranasal sinuses. No mastoid or middle ear effusion. The orbits are normal. CT CERVICAL SPINE FINDINGS Alignment: No static subluxation. Facets are aligned. Occipital condyles are normally positioned. Skull base and vertebrae: There is a minimally displaced fracture of the C1 right superior articular process where it contacts the right occipital condyle. There is no other fracture of the cervical spine. Soft tissues and spinal canal: No prevertebral fluid or swelling. No visible canal hematoma. Disc levels: No advanced spinal canal or neural foraminal stenosis. Upper chest: No pneumothorax, pulmonary nodule or pleural effusion. Other: Normal visualized paraspinal cervical soft tissues. IMPRESSION: 1. Minimally displaced fracture of the C1 right superior articular process where it contacts the right occipital condyle. 2. No acute intracranial abnormality. 3. Large left frontal scalp hematoma without skull fracture. These results were called by telephone at the time of interpretation on 01/21/2022 at 2:07 am to provider MADISON [facility] , who verbally acknowledged these results. Electronically Signed   By: Ulyses Jarred M.D.   On: 01/21/2022 02:07   CT Cervical Spine Wo Contrast  Result Date: 01/21/2022 CLINICAL DATA:  Fall EXAM: CT HEAD WITHOUT CONTRAST CT CERVICAL SPINE WITHOUT CONTRAST TECHNIQUE: Multidetector CT imaging of the head and cervical spine was performed following the standard protocol without intravenous contrast. Multiplanar CT image reconstructions of the cervical spine were also generated. RADIATION DOSE REDUCTION: This exam was performed according to the departmental dose-optimization program which includes automated exposure control, adjustment of the mA and/or kV according to patient size and/or use of iterative reconstruction technique. COMPARISON:  None Available. FINDINGS: CT HEAD FINDINGS Brain: There is no mass, hemorrhage or extra-axial collection. There is generalized atrophy  without lobar predilection. There is hypoattenuation of the periventricular white matter, most commonly indicating chronic ischemic microangiopathy.  Vascular: No abnormal hyperdensity of the major intracranial arteries or dural venous sinuses. No intracranial atherosclerosis. Skull: Large left frontal scalp hematoma.  No skull fracture. Sinuses/Orbits: No fluid levels or advanced mucosal thickening of the visualized paranasal sinuses. No mastoid or middle ear effusion. The orbits are normal. CT CERVICAL SPINE FINDINGS Alignment: No static subluxation. Facets are aligned. Occipital condyles are normally positioned. Skull base and vertebrae: There is a minimally displaced fracture of the C1 right superior articular process where it contacts the right occipital condyle. There is no other fracture of the cervical spine. Soft tissues and spinal canal: No prevertebral fluid or swelling. No visible canal hematoma. Disc levels: No advanced spinal canal or neural foraminal stenosis. Upper chest: No pneumothorax, pulmonary nodule or pleural effusion. Other: Normal visualized paraspinal cervical soft tissues. IMPRESSION: 1. Minimally displaced fracture of the C1 right superior articular process where it contacts the right occipital condyle. 2. No acute intracranial abnormality. 3. Large left frontal scalp hematoma without skull fracture. These results were called by telephone at the time of interpretation on 01/21/2022 at 2:07 am to provider MADISON [facility] , who verbally acknowledged these results. Electronically Signed   By: Ulyses Jarred M.D.   On: 01/21/2022 02:07   DG Shoulder Right  Result Date: 01/21/2022 CLINICAL DATA:  Fall, right shoulder pain EXAM: RIGHT SHOULDER - 2+ VIEW COMPARISON:  None Available. FINDINGS: Joints Fe cyst narrowing and spurring in the [facility] and glenohumeral joints. No acute bony abnormality. Specifically, no fracture, subluxation, or dislocation. Old healed right rib fractures. IMPRESSION: No  acute bony abnormality. Electronically Signed   By: Rolm Baptise M.D.   On: 01/21/2022 01:43    EKG: Independently reviewed.  Sinus rhythm, QTc 515.  No significant change since prior tracing except QT interval slightly improved.  Assessment and Plan  Minimally displaced fracture of the C1 right superior articular process -Secondary to fall tonight -He is not on any antiplatelet agents or anticoagulation. -Continue [place] J collar -Pain management -Frequent neurochecks -Neurosurgery consulted  Recurrent falls -Likely due to alcohol use -Patient currently living alone at home and needs placement to a facility -PT/OT consult -Fall precautions  ESRD on HD MWF Chronic diastolic CHF -Consult nephrology in the morning for dialysis.  Elevated transaminases Alcoholic liver cirrhosis -Continues to drink alcohol daily -Monitor LFTs -Avoid hepatotoxic agents  Alcohol use disorder -CIWA monitoring -Thiamine, folate, multivitamin  Chronic anemia -Hemoglobin 8.8, no significant change from baseline -Has a large forehead hematoma but no other obvious bleeding -Continue to monitor  Chronic thrombocytopenia -Platelet count 99k -Continue to monitor  Large left frontal scalp hematoma with forehead laceration -No skull fracture or acute intracranial abnormality on CT -Forehead laceration repaired in the ED  QT prolongation -Cardiac monitoring -Check magnesium level -Avoid QT prolonging drugs -Repeat EKG in a.m.  Paroxysmal A-fib: Not on anticoagulation due to recurrent falls. 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Hypertension Hyperlipidemia Anxiety/depression GERD -Pharmacy med rec pending.  DVT prophylaxis: SCDs Code Status: DNR (confirmed with the patient) Family Communication: No family available at this time. Consults called: Neurosurgery Level of care: Telemetry bed Admission status: It is my clinical opinion that referral for OBSERVATION is reasonable and necessary in this patient  based on the above information provided. The aforementioned taken together are felt to place the patient at high risk for further clinical deterioration. However, it is anticipated that the patient may be medically stable for discharge from the hospital within 24 to 48 hours.   Shela Leff MD [facility]  If 7PM-7AM, please contact night-coverage [email or web address]  01/21/2022, 5:01 AM

## 2022-01-21 NOTE — ED Provider Notes (Addendum)
[facility] EMERGENCY DEPARTMENT Provider Note  CSN: [id number] Arrival date & time: 01/21/22 0009  Chief Complaint(s) Fall  HPI Darryl Diaz is a 81 y.o. 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  Patient is not entirely sure of the mechanism of his fall today but arrives with complaints of a small headache, right shoulder pain and back pain.  Patient arrives with a visible frontal hematoma but denies blood thinner use.  Denies chest pain, shortness of breath, fever, numbness, tingling, weakness or other systemic, traumatic or neurologic complaints.   Past Medical History Past Medical History:  Diagnosis Date   Alcohol abuse 50/35/4656   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[place]) 03/17/2020   Alcoholism ([place])    in remission following wife's death   Anemia    Anxiety    Ascending aorta dilation (HCC)    Atrial fibrillation ([place])    Cardiac arrest ([place]) 04/20/2019   12 min CPR with epi   Chronic diastolic CHF (congestive heart failure) ([place])    Dysrhythmia    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[place])    Mon Wed Fri   Fatty liver    Fracture of hip ([place]) 03/30/2016   Gastritis    GERD (gastroesophageal reflux disease)    H/O seasonal allergies    Heme positive stool 05/14/2020   History of blood transfusion    History of GI bleed    Hx of adenomatous colonic polyps 08/10/2017   Hyperlipidemia    Hypertension    Insomnia    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   Mallory-Weiss tear    OA (osteoarthritis)    hands   OSA (obstructive sleep apnea)    No longer uses CPAP   Persistent atrial fibrillation with rapid ventricular response ([place]) 02/05/2020   Posterior dislocation of hip, closed ([place])  04/30/2016   Recurrent syncope    Restless legs syndrome (RLS)    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([place])    Patient Active Problem List   Diagnosis Date Noted   Generalized weakness 11/24/2021   Severe sepsis ([place]) 11/24/2021   Lactic acidosis 11/24/2021   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   Acute hyponatremia 09/27/2021   Incidental finding of COVID-19 virus infection 06/07/2021   Dependence on renal dialysis ([place]) 05/25/2021   Non-pressure chronic ulcer of other part of left foot with unspecified severity ([place]) 05/25/2021   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[place]) 05/25/2021   Osteomyelitis of great toe of left foot ([place])    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[place]) 04/13/2021   Cellulitis of right foot 01/17/2021   Normocytic anemia 01/17/2021   UTI (urinary tract infection) 01/17/2021   Acute GI bleeding 01/06/2021   Pancytopenia ([place]) 12/02/2020   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   Orthostatic hypotension 11/23/2020   Acute renal insufficiency 10/23/2020   Alcohol abuse 10/23/2020   Hypokalemia 10/23/2020   Hypomagnesemia 10/23/2020   Low back pain at multiple sites 10/23/2020   Malnutrition of mild degree Altamease Oiler: 75% to less than 90% of standard weight) ([place]) 10/23/2020   Open wound of scalp without complication 81/27/5170   Oral candidiasis 10/23/2020   Cellulitis of left foot    Cellulitis of toe of left foot 10/13/2020   Skin ulcer of left great toe (  [place]) 10/13/2020   Chronic diastolic CHF (congestive heart failure) ([place]) 10/13/2020   Transient weakness of left lower extremity 09/07/2020   Abnormality of gait 04/27/2020   Salmonella food poisoning 02/58/5277   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[place]) 03/17/2020   Yeast infection 03/16/2020   Diarrhea 03/16/2020   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 (HCC)    Allergies    Anemia, chronic disease    History of hypertension    Debility    Syncope and  collapse 02/22/2020   Weakness 02/06/2020   History of repair of hip joint 10/15/2019   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   Pain in right foot 06/27/2019   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   Bradycardia 04/20/2019   Gastrointestinal hemorrhage 02/25/2019   Fall at home, initial encounter 02/25/2019   Bilateral lower extremity edema 02/25/2019   Chronic hyponatremia 02/25/2019   Fall 02/25/2019   Benign prostatic hyperplasia with lower urinary tract symptoms 12/19/2018   Hardening of the aorta (main artery of the heart) ([place]) 08/11/2018   Benign neoplasm of colon 06/18/2018   History of adenomatous polyp of colon 08/10/2017   Impaired fasting glucose 06/10/2016   Encounter for general adult medical examination without abnormal findings 06/03/2016   Hip fracture ([place]) 03/30/2016   Hypertension 03/30/2016   Hyperlipidemia 03/30/2016   Osteoarthritis 03/30/2016   Depressive disorder 03/30/2016   Rhabdomyolysis 03/30/2016   Leukocytosis 03/30/2016   Pressure ulcer 03/30/2016   GERD (gastroesophageal reflux disease) 12/17/2015   Paroxysmal atrial fibrillation ([place]) 12/17/2015   Long term (current) use of anticoagulants 12/17/2015   Pure hypercholesterolemia 12/17/2015   Seasonal allergic rhinitis 12/17/2015   CAD (coronary artery disease) 08/21/2015   Home Medication(s) Prior to Admission medications   Medication Sig Start Date End Date Taking? Authorizing Provider  amiodarone (PACERONE) 200 MG tablet TAKE 1 TABLET BY MOUTH ONCE DAILY Patient taking differently: Take 200 mg by mouth every morning. 05/17/21   Richardson Dopp T, PA-C  atorvastatin (LIPITOR) 20 MG tablet Take 1 tablet (20 mg total) by mouth daily. 03/10/20   Angiulli, Lavon Paganini, PA-C  carvedilol (COREG) 6.25 MG tablet Take 1 tablet (6.25 mg total) by mouth 2 (two) times daily. Patient taking differently: Take 12.5 mg by mouth daily. 02/11/21   Sherren Mocha, MD  cetirizine (ZYRTEC) 10 MG tablet Take 10 mg by mouth  daily.    [provider]  escitalopram (LEXAPRO) 10 MG tablet Take 1 tablet (10 mg total) by mouth daily. Patient taking differently: Take 10 mg by mouth every morning. 03/10/20   Angiulli, Lavon Paganini, PA-C  ferrous sulfate 324 MG TBEC Take 324 mg by mouth every morning.    [provider]  FOLIC ACID PO Take 1 tablet by mouth every morning.    [provider]  Magnesium Oxide 400 MG CAPS Take 1 capsule (400 mg total) by mouth daily. 10/17/20   Eugenie Filler, MD  Multiple Vitamin (MULTIVITAMIN PO) Take 1 tablet by mouth every other day.    [provider]  nitroGLYCERIN (NITROSTAT) 0.4 MG SL tablet Place 1 tablet (0.4 mg total) under the tongue every 5 (five) minutes x 3 doses as needed for chest pain. 07/28/20   Baldwin Jamaica, PA-C  pantoprazole (PROTONIX) 40 MG tablet Take 1 tablet (40 mg total) by mouth daily. Patient taking differently: Take 40 mg by mouth every morning. 03/10/20   Angiulli, Lavon Paganini, PA-C  sodium bicarbonate 650 MG tablet Take 650 mg by  mouth daily.    [provider]  tamsulosin (FLOMAX) 0.4 MG CAPS capsule Take 0.4 mg by mouth at bedtime.    [provider]  torsemide (DEMADEX) 20 MG tablet Take 60 mg by mouth daily.    [provider]  traZODone (DESYREL) 50 MG tablet Take 1 tablet (50 mg total) by mouth at bedtime. 11/26/21   Alma Friendly, MD                                                                                                                                    Past Surgical History Past Surgical History:  Procedure Laterality Date   AMPUTATION Left 05/19/2021   Procedure: LEFT GREAT TOE AMPUTATION;  Surgeon: Newt Minion, MD;  Location: [place];  Service: Orthopedics;  Laterality: Left;   ANKLE SURGERY Left 2014   had rods put in    AV FISTULA PLACEMENT Left 04/21/2021   Procedure: LEFT ARM ARTERIOVENOUS (AV) FISTULA CREATION - Left brachiocephalic fistula;  Surgeon: Marty Heck, MD;  Location: [place];  Service: Vascular;  Laterality: Left;   BIOPSY  02/28/2019   Procedure: BIOPSY;  Surgeon: Milus Banister, MD;  Location: [place];  Service: Endoscopy;;   CARDIOVERSION  02/06/2020   CARDIOVERSION N/A 02/06/2020   Procedure: CARDIOVERSION;  Surgeon: Werner Lean, MD;  Location: [place] ENDOSCOPY;  Service: Cardiovascular;  Laterality: N/A;   CARDIOVERSION N/A 02/24/2020   Procedure: CARDIOVERSION;  Surgeon: Werner Lean, MD;  Location: [place];  Service: Cardiovascular;  Laterality: N/A;   COLONOSCOPY     [place]   COLONOSCOPY WITH PROPOFOL N/A 01/08/2021   Procedure: COLONOSCOPY WITH PROPOFOL;  Surgeon: Milus Banister, MD;  Location[facility] END[facility]COPY;  Service: Endoscopy;  Laterality: N/A;   ESOPHAGOGASTRODUODENOSCOPY (EGD) WITH PROPOFOL N/A 02/28/2019   Procedure: ESOPHAGOGASTRODUODENOSCOPY (EGD) WITH PROPOFOL;  Surgeon: Milus Banister, MD;  Location: [facility] ENDOSCOPY;  Service: Endoscopy;  Laterality: N/A;   FRACTURE SURGERY     HIP ARTHROPLASTY Left 04/01/2016   Procedure: ARTHROPLASTY BIPOLAR HIP (HEMIARTHROPLASTY);  Surgeon: Paralee Cancel, MD;  Location: [place];  Service: Orthopedics;  Laterality: Left;   HIP CLOSED REDUCTION Left 04/30/2016   Procedure: CLOSED REDUCTION HIP;  Surgeon: Wylene Simmer, MD;  Location: [place];  Service: Orthopedics;  Laterality: Left;   HOT HEMOSTASIS N/A 01/08/2021   Procedure: HOT HEMOSTASIS (ARGON PLASMA COAGULATION/BICAP);  Surgeon: Milus Banister, MD;  Location: [facility] ENDOSCOPY;  Service: Endoscopy;  Laterality: N/A;   IR RADIOLOGIST EVAL & MGMT  12/31/2020   JOINT REPLACEMENT     TONSILLECTOMY     TOTAL HIP ARTHROPLASTY Right 10/15/2019   Procedure: TOTAL HIP ARTHROPLASTY ANTERIOR APPROACH;  Surgeon: Paralee Cancel, MD;  Location: [facility];  Service: Orthopedics;  Laterality: Right;  70 mins   VASCULAR SURGERY     Family History Family History  Problem Relation Age of Onset   Heart  disease Mother 79   Hypertension Mother    Arthritis Mother    Heart failure Father 50   Stroke Maternal Aunt    Heart failure Sister    Colon cancer Neg Hx    Esophageal cancer Neg Hx    Stomach cancer Neg Hx    Rectal cancer Neg Hx     Social History Social History   Tobacco Use   Smoking status: Former    Packs/day: 1.00    Years: 20.00    Total pack years: 20.00    Types: Cigarettes   Smokeless tobacco: Never   Tobacco comments:       quit 20 years  smoked on and off for 20 years  Vaping Use   Vaping Use: Never used  Substance Use Topics   Alcohol use: Not Currently    Alcohol/week: 3.0 standard drinks of alcohol    Types: 3 Glasses of wine per week    Comment: h/o heavy use   Drug use: Never   Allergies Penicillin g sodium and Penicillins  Review of Systems Review of Systems  Musculoskeletal:  Positive for arthralgias, back pain, myalgias and neck pain.  Skin:  Positive for wound.    Physical Exam Vital Signs  I have reviewed the triage vital signs BP (!) 161/68   Pulse 69   Temp 97.7 F (36.5 C) (Oral)   Resp (!) 27   Ht 6' (1.829 m)   Wt 83 kg   SpO2 100%   BMI 24.82 kg/m   Physical Exam Constitutional:      General: He is not in acute distress.    Appearance: Normal appearance.  HENT:     Head: Normocephalic.     Comments: Frontal hematoma    Nose: No congestion or rhinorrhea.  Eyes:     General:        Right eye: No discharge.        Left eye: No discharge.     Extraocular Movements: Extraocular movements intact.     Pupils: Pupils are equal, round, and reactive to light.  Cardiovascular:     Rate and Rhythm: Normal rate and regular rhythm.     Heart sounds: No murmur heard. Pulmonary:     Effort: No respiratory distress.     Breath sounds: No wheezing or rales.  Abdominal:     General: There is no distension.     Tenderness: There is no abdominal tenderness.  Musculoskeletal:        General: Swelling present. Normal range of  motion.     Cervical back: Normal range of motion. Tenderness present.  Skin:    General: Skin is warm and dry.  Neurological:     General: No focal deficit present.     Mental Status: He is alert.     ED Results and Treatments Labs (all labs ordered are listed, but only abnormal results are displayed) Labs Reviewed  COMPREHENSIVE METABOLIC PANEL  CBC WITH DIFFERENTIAL/PLATELET  Radiology No results found.  Pertinent labs & imaging results that were available during my care of the patient were reviewed by me and considered in my medical decision making (see MDM for details).  Medications Ordered in ED Medications  morphine (PF) 4 MG/ML injection 4 mg (has no administration in time range)  ondansetron (ZOFRAN) injection 4 mg (has no administration in time range)                                                                                                                                     Procedures .Marland KitchenLaceration Repair  Date/Time: 01/21/2022 4:55 AM  Performed by: Teressa Lower, MD Authorized by: Teressa Lower, MD   Anesthesia:    Anesthesia method:  Local infiltration   Local anesthetic:  Lidocaine 1% WITH epi Laceration details:    Location:  Face   Face location:  Forehead   Length (cm):  6 Pre-procedure details:    Preparation:  Patient was prepped and draped in usual sterile fashion Treatment:    Area cleansed with:  Saline   Amount of cleaning:  Standard   Irrigation method:  Pressure wash Skin repair:    Repair method:  Sutures   Suture size:  4-0   Suture material:  Chromic gut   Suture technique:  Simple interrupted Approximation:    Approximation:  Close .Critical Care  Performed by: Teressa Lower, MD Authorized by: Teressa Lower, MD   Critical care provider statement:    Critical care time (minutes):  30   Critical  care was time spent personally by me on the following activities:  Development of treatment plan with patient or surrogate, discussions with consultants, evaluation of patient's response to treatment, examination of patient, ordering and review of laboratory studies, ordering and review of radiographic studies, ordering and performing treatments and interventions, pulse oximetry, re-evaluation of patient's condition and review of old charts   (including critical care time)  Medical Decision Making / ED Course   This patient presents to the ED for concern of fall this involves an extensive number of treatment options, and is a complaint that carries with it a high risk of complications and morbidity.  The differential diagnosis includes laceration, fracture, intracranial hemorrhage, retained foreign body  MDM: Patient seen the emergency room for evaluation of a fall.  Physical exam reveals a 6 cm laceration over the forehead with an associated forehead hematoma, tenderness in the C-spine.  Patient remains in a c-collar.  Laboratory evaluation with hemoglobin 8.8 with an MCV of 103.3, potassium 3.4, chloride 96, BUN 37, creatinine 3.35, AST 289, ALT 107 consistent with his chronic alcohol use.  Patient's alcohol level is 115.  CT imaging of the head and CT L-spine is showing a minimally displaced C1 right superior articular process fracture but is otherwise unremarkable.  I spoke with the neurosurgeon on-call Dr. Christella Noa who states that this will likely be a nonoperative  fracture and patient will need to remain in a c-collar.  Laceration repaired at bedside and patient will require hospital admission due to frequent falls, new cervical fracture and likely need for rehab placement.   Additional history obtained:  -External records from outside source obtained and reviewed including: Chart review including previous notes, labs, imaging, consultation notes   Lab Tests: -I ordered, reviewed, and  interpreted labs.   The pertinent results include:   Labs Reviewed  COMPREHENSIVE METABOLIC PANEL  CBC WITH DIFFERENTIAL/PLATELET      EKG   EKG Interpretation  Date/Time:  Friday January 21 2022 00:31:02 EDT Ventricular Rate:  69 PR Interval:  195 QRS Duration: 115 QT Interval:  480 QTC Calculation: 515 R Axis:   -13 Text Interpretation: Sinus rhythm  \ Confirmed by Vitaliy Eisenhour ([id number]) on 01/21/2022 12:34:18 AM         Imaging Studies ordered: I ordered imaging studies including CT head, C-spine, T-spine, L-spine I independently visualized and interpreted imaging. I agree with the radiologist interpretation   Medicines ordered and prescription drug management: Meds ordered this encounter  Medications   morphine (PF) 4 MG/ML injection 4 mg   ondansetron (ZOFRAN) injection 4 mg    -I have reviewed the patients home medicines and have made adjustments as needed  Critical interventions none  Consultations Obtained: I requested consultation with the neurosurgeon on-call,  and discussed lab and imaging findings as well as pertinent plan - they recommend: C-collar, no surgical intervention   Cardiac Monitoring: The patient was maintained on a cardiac monitor.  I personally viewed and interpreted the cardiac monitored which showed an underlying rhythm of: NSR  Social Determinants of Health:  Factors impacting patients care include: Persistent daily alcohol use   Reevaluation: After the interventions noted above, I reevaluated the patient and found that they have :improved  Co morbidities that complicate the patient evaluation  Past Medical History:  Diagnosis Date   Alcohol abuse 93/81/8299   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[place]) 03/17/2020   Alcoholism ([place])    in remission following wife's death   Anemia    Anxiety    Ascending aorta dilation (HCC)    Atrial fibrillation ([place])    Cardiac arrest ([place]) 04/20/2019   12 min CPR with epi    Chronic diastolic CHF (congestive heart failure) ([place])    Dysrhythmia    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[place])    Mon Wed Fri   Fatty liver    Fracture of hip ([place]) 03/30/2016   Gastritis    GERD (gastroesophageal reflux disease)    H/O seasonal allergies    Heme positive stool 05/14/2020   History of blood transfusion    History of GI bleed    Hx of adenomatous colonic polyps 08/10/2017   Hyperlipidemia    Hypertension    Insomnia    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   Mallory-Weiss tear    OA (osteoarthritis)    hands   OSA (obstructive sleep apnea)    No longer uses CPAP   Persistent atrial fibrillation with rapid ventricular response ([place]) 02/05/2020   Posterior dislocation of hip, closed ([place]) 04/30/2016   Recurrent syncope    Restless legs syndrome (RLS)    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(HCC)       Dispostion: I considered admission for this patient, and due to new C-spine fracture, frequent falls and need for likely placement, patient will require hospital admission  Final Clinical Impression(s) / ED Diagnoses Final diagnoses:  None     '@PCDICTATION'$ @    Teressa Lower, MD 01/21/22 3074    Teressa Lower, MD 03/01/22 1138

## 2022-01-21 NOTE — ED Notes (Signed)
C-collar in place for precautions.

## 2022-01-21 NOTE — Progress Notes (Signed)
PT Cancellation Note  Patient Details Name: Darryl Diaz MRN: [id number] DOB: 13-Jun-1940   Cancelled Treatment:    Reason Eval/Treat Not Completed: Patient at procedure or test/unavailable Pt just left for HD and will not be back till later in evening.  Will f/u tomorrow as able. Abran Richard, PT Acute Rehab [facility] [phone]   [name] 01/21/2022, 1:41 PM

## 2022-01-21 NOTE — ED Notes (Signed)
Pt expressed concern related to his black canvas bag, with a "[employer] and its location. This tech and [place], NT searched pt room and room 23 with no sight of the bag. Pt sister, Susa Simmonds was called to see if she was aware of the bag and she said it was in his possession yesterday 09/12 and she would check to see if it was still at home. Opal Sidles stated she would call the nurse, Curt Bears, after checking the home.

## 2022-01-21 NOTE — Progress Notes (Addendum)
Pt receives out-pt HD at [facility]) on MWF. Pt arrives at 11:10 for 11:30 chair time. Spoke to clinic staff and clinic social worker, Jackelyn Poling. Pt was very recently (possibly this week) d/c from snf. Pt had informed clinic that he was making arrangements to move to [place]. Clinic social worker unsure what level of care pt was supposed to be accepted to when pt moves in. Clinic and clinic social worker working to see if pt can be transferred to a closer HD clinic to [place]. That has not been arranged or finalized at this time. Clinic social worker also checking on transportation options for pt from [place] to HD clinic. That has not been resolved as of yet either. Navigator informed clinic staff that update would be provided to clinic regarding d/c disposition once known. Will assist as needed.   Melven Sartorius Renal Navigator [phone]

## 2022-01-21 NOTE — ED Notes (Signed)
Pt still at dialysis. This nurse initiated report to receiving nurse on 3W and informed dialysis to check is room is clean and ready before bringing back to ED

## 2022-01-21 NOTE — ED Triage Notes (Signed)
Pt BIB [facility] from home after fall occurred while pt attempted to get in bed. Pt denies LOC and no thinners. Hematoma to forehead, bleeding controlled (unknown what head hit). Per EMS pt is making repetitive statements. VSS, AO HX- afib, multiple falls

## 2022-01-21 NOTE — ED Notes (Signed)
[name] [name] sister [phone] requesting an update/ to speak to the patient

## 2022-01-21 NOTE — ED Notes (Signed)
Pt calling out c/o R shoulder/back pain, EDP notified.

## 2022-01-22 DIAGNOSIS — F419 Anxiety disorder, unspecified: Secondary | ICD-10-CM | POA: Diagnosis present

## 2022-01-22 DIAGNOSIS — G2581 Restless legs syndrome: Secondary | ICD-10-CM | POA: Diagnosis present

## 2022-01-22 DIAGNOSIS — N2581 Secondary hyperparathyroidism of renal origin: Secondary | ICD-10-CM | POA: Diagnosis present

## 2022-01-22 DIAGNOSIS — Z9181 History of falling: Secondary | ICD-10-CM | POA: Diagnosis not present

## 2022-01-22 DIAGNOSIS — D61818 Other pancytopenia: Secondary | ICD-10-CM | POA: Diagnosis present

## 2022-01-22 DIAGNOSIS — N186 End stage renal disease: Secondary | ICD-10-CM | POA: Diagnosis not present

## 2022-01-22 DIAGNOSIS — R55 Syncope and collapse: Secondary | ICD-10-CM | POA: Diagnosis not present

## 2022-01-22 DIAGNOSIS — D631 Anemia in chronic kidney disease: Secondary | ICD-10-CM | POA: Diagnosis not present

## 2022-01-22 DIAGNOSIS — K7030 Alcoholic cirrhosis of liver without ascites: Secondary | ICD-10-CM | POA: Diagnosis present

## 2022-01-22 DIAGNOSIS — N250 Renal osteodystrophy: Secondary | ICD-10-CM | POA: Diagnosis not present

## 2022-01-22 DIAGNOSIS — N185 Chronic kidney disease, stage 5: Secondary | ICD-10-CM | POA: Diagnosis not present

## 2022-01-22 DIAGNOSIS — I132 Hypertensive heart and chronic kidney disease with heart failure and with stage 5 chronic kidney disease, or end stage renal disease: Secondary | ICD-10-CM | POA: Diagnosis not present

## 2022-01-22 DIAGNOSIS — D696 Thrombocytopenia, unspecified: Secondary | ICD-10-CM | POA: Diagnosis present

## 2022-01-22 DIAGNOSIS — F32A Depression, unspecified: Secondary | ICD-10-CM | POA: Diagnosis present

## 2022-01-22 DIAGNOSIS — F29 Unspecified psychosis not due to a substance or known physiological condition: Secondary | ICD-10-CM | POA: Diagnosis not present

## 2022-01-22 DIAGNOSIS — K219 Gastro-esophageal reflux disease without esophagitis: Secondary | ICD-10-CM | POA: Diagnosis present

## 2022-01-22 DIAGNOSIS — I495 Sick sinus syndrome: Secondary | ICD-10-CM | POA: Diagnosis present

## 2022-01-22 DIAGNOSIS — S12000A Unspecified displaced fracture of first cervical vertebra, initial encounter for closed fracture: Secondary | ICD-10-CM | POA: Diagnosis not present

## 2022-01-22 DIAGNOSIS — I4819 Other persistent atrial fibrillation: Secondary | ICD-10-CM | POA: Diagnosis present

## 2022-01-22 DIAGNOSIS — I5032 Chronic diastolic (congestive) heart failure: Secondary | ICD-10-CM | POA: Diagnosis not present

## 2022-01-22 DIAGNOSIS — Z992 Dependence on renal dialysis: Secondary | ICD-10-CM | POA: Diagnosis not present

## 2022-01-22 DIAGNOSIS — Z7401 Bed confinement status: Secondary | ICD-10-CM | POA: Diagnosis not present

## 2022-01-22 DIAGNOSIS — W1830XA Fall on same level, unspecified, initial encounter: Secondary | ICD-10-CM | POA: Diagnosis present

## 2022-01-22 DIAGNOSIS — S12001A Unspecified nondisplaced fracture of first cervical vertebra, initial encounter for closed fracture: Secondary | ICD-10-CM | POA: Diagnosis not present

## 2022-01-22 DIAGNOSIS — Z79899 Other long term (current) drug therapy: Secondary | ICD-10-CM | POA: Diagnosis not present

## 2022-01-22 DIAGNOSIS — Z87891 Personal history of nicotine dependence: Secondary | ICD-10-CM | POA: Diagnosis not present

## 2022-01-22 DIAGNOSIS — E785 Hyperlipidemia, unspecified: Secondary | ICD-10-CM | POA: Diagnosis present

## 2022-01-22 DIAGNOSIS — I509 Heart failure, unspecified: Secondary | ICD-10-CM | POA: Diagnosis not present

## 2022-01-22 DIAGNOSIS — R519 Headache, unspecified: Secondary | ICD-10-CM | POA: Diagnosis present

## 2022-01-22 DIAGNOSIS — W19XXXA Unspecified fall, initial encounter: Secondary | ICD-10-CM | POA: Diagnosis not present

## 2022-01-22 DIAGNOSIS — Y92003 Bedroom of unspecified non-institutional (private) residence as the place of occurrence of the external cause: Secondary | ICD-10-CM | POA: Diagnosis not present

## 2022-01-22 DIAGNOSIS — J9811 Atelectasis: Secondary | ICD-10-CM | POA: Diagnosis not present

## 2022-01-22 DIAGNOSIS — E876 Hypokalemia: Secondary | ICD-10-CM | POA: Diagnosis present

## 2022-01-22 DIAGNOSIS — F1021 Alcohol dependence, in remission: Secondary | ICD-10-CM | POA: Diagnosis present

## 2022-01-22 DIAGNOSIS — E8889 Other specified metabolic disorders: Secondary | ICD-10-CM | POA: Diagnosis present

## 2022-01-22 DIAGNOSIS — Z66 Do not resuscitate: Secondary | ICD-10-CM | POA: Diagnosis present

## 2022-01-22 LAB — CBC
HCT: 25.4 % — ABNORMAL LOW (ref 39.0–52.0)
Hemoglobin: 8.6 g/dL — ABNORMAL LOW (ref 13.0–17.0)
MCH: 35.7 pg — ABNORMAL HIGH (ref 26.0–34.0)
MCHC: 33.9 g/dL (ref 30.0–36.0)
MCV: 105.4 fL — ABNORMAL HIGH (ref 80.0–100.0)
Platelets: 96 10*3/uL — ABNORMAL LOW (ref 150–400)
RBC: 2.41 MIL/uL — ABNORMAL LOW (ref 4.22–5.81)
RDW: 15.6 % — ABNORMAL HIGH (ref 11.5–15.5)
WBC: 3.1 10*3/uL — ABNORMAL LOW (ref 4.0–10.5)
nRBC: 0 % (ref 0.0–0.2)

## 2022-01-22 LAB — BASIC METABOLIC PANEL
Anion gap: 13 (ref 5–15)
BUN: 16 mg/dL (ref 8–23)
CO2: 27 mmol/L (ref 22–32)
Calcium: 9 mg/dL (ref 8.9–10.3)
Chloride: 97 mmol/L — ABNORMAL LOW (ref 98–111)
Creatinine, Ser: 1.96 mg/dL — ABNORMAL HIGH (ref 0.61–1.24)
GFR, Estimated: 34 mL/min — ABNORMAL LOW (ref >60)
Glucose, Bld: 86 mg/dL (ref 70–99)
Potassium: 3.3 mmol/L — ABNORMAL LOW (ref 3.5–5.1)
Sodium: 137 mmol/L (ref 135–145)

## 2022-01-22 LAB — HEPATITIS B SURFACE ANTIBODY, QUANTITATIVE: Hep B S AB Quant (Post): 17.3 m[IU]/mL (ref >9.9)

## 2022-01-22 MED ORDER — POTASSIUM CHLORIDE CRYS ER 20 MEQ PO TBCR
20.0000 meq | EXTENDED_RELEASE_TABLET | Freq: Once | ORAL | Status: DC
  Filled 2022-01-22: qty 1

## 2022-01-22 NOTE — Progress Notes (Signed)
Admit: 01/21/2022 LOS: 0  61M ESRD MWF with fall and C1 Fx and Scalp Hematoma  Subjective:  HD yesterday:  1.8L UF No interval events Agrees needs SNF upon DC  09/15 0701 - 09/16 0700 In: -  Out: 1.8   Filed Weights   01/21/22 0033  Weight: 83 kg    Scheduled Meds:  amiodarone  200 mg Oral Daily   atorvastatin  20 mg Oral Daily   carvedilol  6.25 mg Oral BID   Chlorhexidine Gluconate Cloth  6 each Topical Q0600   escitalopram  10 mg Oral Daily   folic acid  1 mg Oral Daily   multivitamin with minerals  1 tablet Oral Daily   potassium chloride  20 mEq Oral Once   tamsulosin  0.4 mg Oral QHS   thiamine  100 mg Oral Daily   Or   thiamine  100 mg Intravenous Daily   torsemide  60 mg Oral Daily   Continuous Infusions:  anticoagulant sodium citrate     PRN Meds:.alteplase, anticoagulant sodium citrate, heparin, HYDROmorphone (DILAUDID) injection, lidocaine (PF), lidocaine-prilocaine, pentafluoroprop-tetrafluoroeth  Current Labs: reviewed   Physical Exam:  Blood pressure (!) 159/74, pulse 72, temperature 98.4 F (36.9 C), temperature source Oral, resp. rate 18, height 6' (1.829 m), weight 83 kg, SpO2 99 %. General: Elderly man, no acute distress; lying flat in bed Head: Large scalp/forehead laceration; left eye bruising  Neck: in cervical collar  Lungs: Clear bilaterally without wheezes, rales, or rhonchi. Normal WOB  Heart: RRR, no murmur, rub, or gallop  Abdomen: soft non-tender, bowel sounds normal, no masses  Lower extremities:without edema or ischemic changes, no open wounds  Neuro: A & O X 3. Moves all extremities spontaneously. Psych:  Responds to questions appropriately with a normal affect. Dialysis Access: LUE AVF +bruit; bruising to upper L arm    Dialysis Orders:  Unit: AF MWF Time: 4:00  EDW: 83.5kg  Flows: 400 A1.5x  Bath: 2K/2Ca  Access: AVF  Heparin: None Mircera 50 q 2 wks (last 9/4) VDRA: None   Assessment/Plan: C1 fracture/scalp hematoma  d/t fall at home: Management per primary team and NSG. Lives alone with hx of recurrent falls. Needs SNF placement, patient agrees.   ESRD -  HD MWF. Continue on schedule. HD 9/18  Hypertension/volume  - BP/volume acceptable. UF to EDW as able, will hope for standing weights next week  Anemia  - Hgb 8.6. Next ESA due 7/09  Metabolic bone disease -  Ca acceptable. No VDRA.   Nutrition - Renal diet with fluid restriction  Medication Issues; Preferred narcotic agents for pain control are hydromorphone, fentanyl, and methadone. Morphine should not be used.  Baclofen should be avoided Avoid oral sodium phosphate and magnesium citrate based laxatives / bowel preps    Pearson Grippe MD 01/22/2022, 9:23 AM  Recent Labs  Lab 01/21/22 0027 01/21/22 0911 01/22/22 0458  NA 133* 134* 137  K 3.4* 4.0 3.3*  CL 96* 97* 97*  CO2 '23 27 27  '$ GLUCOSE 88 93 86  BUN 37* 36* 16  CREATININE 3.35* 3.32* 1.96*  CALCIUM 9.3 9.0 9.0   Recent Labs  Lab 01/21/22 0027 01/21/22 0911 01/22/22 0458  WBC 4.4 4.2 3.1*  NEUTROABS 2.8  --   --   HGB 8.8* 8.5* 8.6*  HCT 25.2* 25.9* 25.4*  MCV 103.3* 108.4* 105.4*  PLT 99* 99* 96*

## 2022-01-22 NOTE — Progress Notes (Signed)
PROGRESS NOTE    Darryl Diaz  XNA:[id number] DOB: May 04, 1941 DOA: 01/21/2022 PCP: [name], MD   Brief Narrative:  Darryl Diaz is a 81 y.o. male with medical history significant of ESRD on HD MWF, chronic diastolic CHF, paroxysmal A-fib not on anticoagulation due to recurrent falls, tachy-brady syndrome, hypertension, hyperlipidemia, anxiety/depression, GERD, chronic pancytopenia, history of GI bleed, alcohol abuse, alcoholic liver cirrhosis, OSA not on CPAP, history of cardiac arrest in 2020. Patient lives alone at home and presented to the ED tonight via EMS after he had a fall while attempting to get in bed - notes he lost balance without LOC.  CT C-spine showing minimally displaced fracture of the C1 right superior articular process. CT head showing large left frontal scalp hematoma without skull fracture or any acute intracranial abnormality.  Remainder of trauma scans including x-ray of right shoulder and CT of lumbar and thoracic spine negative for acute traumatic injuries. Placed in [place] [facility] collar and neurosurgery consulted (Dr. Christella Noa) who recommends to remain in collar with outpatient follow up in clinic (no indication for intervention based on their evaluation).   Assessment & Plan:   Principal Problem:   C1 cervical fracture (HCC) Active Problems:   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HCC)   Paroxysmal atrial fibrillation (HCC)   Hypertension   Hyperlipidemia   Anemia, chronic disease   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HCC)   Chronic diastolic CHF (congestive heart failure) (HCC)   Alcohol abuse   Minimally displaced fracture of the C1 right superior articular process -Secondary to mechanical fall, denies LOC; does drink occasionally -He is not on any antiplatelet agents or anticoagulation. -Continue [place] [facility] collar per neuroSx until office follow up   Recurrent falls, ambulatory dysfunction Complicated by alcohol use -Patient currently  living alone at home - PT to evaluate for safe dispo -PT/OT consult pending   ESRD on HD MWF Chronic diastolic CHF -HD per nephrology   Elevated transaminases Alcoholic liver cirrhosis Alcohol use disorder -Alcohol use daily - follow labs/symptoms -CIWA monitoring -Thiamine, folate, multivitamin ongoing   Chronic anemia -Hemoglobin 8.8, no significant change from baseline -Has a large forehead hematoma/laceration but no other obvious bleeding -Continue to monitor   Chronic thrombocytopenia -Platelet count 99k -Continue to monitor   Large left frontal scalp hematoma with forehead laceration -No skull fracture or acute intracranial abnormality on CT -Forehead laceration repaired per ED   QT prolongation -Cardiac monitoring -Check magnesium level -Avoid QT prolonging drugs -Repeat EKG in a.m.   Paroxysmal A-fib: Not on anticoagulation due to recurrent falls. 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Hypertension Hyperlipidemia -Continue home amiodarone, carvedilol, statin  Anxiety/depression -Continue home lexapro  BPH -Continue tamsulosin  GERD -Well controlled  DVT prophylaxis: None given bleeding as above Code Status: DNR Family Communication: None present  Status is: Inpt  Dispo: The patient is from: Home              Anticipated d/c is to: TBD              Anticipated d/c date is: 24-48h              Patient currently NOT medically stable for discharge  Consultants:  NeuroSx  Procedures:  None  Antimicrobials:  None   Subjective: No acute issue/events overnight  Objective: Vitals:   01/21/22 1914 01/21/22 2042 01/21/22 2332 01/22/22 0404  BP: (!) 155/67 (!) 167/80 (!) 155/69 (!) 138/50  Pulse: 71 74 74 75  Resp: 19 17  17 18  Temp: 97.6 F (36.4 C) 98.3 F (36.8 C) 98.2 F (36.8 C) 98.2 F (36.8 C)  TempSrc:  Oral Oral Oral  SpO2:  97% 98% 100%  Weight:      Height:        Intake/Output Summary (Last 24 hours) at 01/22/2022 0720 Last data filed at  01/21/2022 1914 Gross per 24 hour  Intake --  Output 1.8 ml  Net -1.8 ml   Filed Weights   01/21/22 0033  Weight: 83 kg    Examination:  General exam: Appears calm and comfortable  Respiratory system: Clear to auscultation. Respiratory effort normal. Cardiovascular system: S1 & S2 heard, RRR. No JVD, murmurs, rubs, gallops or clicks. No pedal edema. Gastrointestinal system: Abdomen is nondistended, soft and nontender. No organomegaly or masses felt. Normal bowel sounds heard. Central nervous system: Alert and oriented. No focal neurological deficits. Extremities: Symmetric 5 x 5 power. Skin: Large forehead hematoma/laceration  Data Reviewed: I have personally reviewed following labs and imaging studies  CBC: Recent Labs  Lab 01/21/22 0027 01/21/22 0911 01/22/22 0458  WBC 4.4 4.2 3.1*  NEUTROABS 2.8  --   --   HGB 8.8* 8.5* 8.6*  HCT 25.2* 25.9* 25.4*  MCV 103.3* 108.4* 105.4*  PLT 99* 99* 96*    Basic Metabolic Panel: Recent Labs  Lab 01/21/22 0027 01/21/22 0911 01/22/22 0458  NA 133* 134* 137  K 3.4* 4.0 3.3*  CL 96* 97* 97*  CO2 '23 27 27  '$ GLUCOSE 88 93 86  BUN 37* 36* 16  CREATININE 3.35* 3.32* 1.96*  CALCIUM 9.3 9.0 9.0  MG  --  2.1  --     GFR: Estimated Creatinine Clearance: 33 mL/min (A) (by C-G formula based on SCr of 1.96 mg/dL (H)). Liver Function Tests: Recent Labs  Lab 01/21/22 0027  AST 289*  ALT 107*  ALKPHOS 107  BILITOT 1.3*  PROT 5.6*  ALBUMIN 3.3*    No results for input(s): "LIPASE", "AMYLASE" in the last 168 hours. No results for input(s): "AMMONIA" in the last 168 hours. Coagulation Profile: No results for input(s): "INR", "PROTIME" in the last 168 hours. Cardiac Enzymes: No results for input(s): "CKTOTAL", "CKMB", "CKMBINDEX", "TROPONINI" in the last 168 hours. BNP (last 3 results) No results for input(s): "PROBNP" in the last 8760 hours. HbA1C: No results for input(s): "HGBA1C" in the last 72 hours. CBG: No results  for input(s): "GLUCAP" in the last 168 hours. Lipid Profile: No results for input(s): "CHOL", "HDL", "LDLCALC", "TRIG", "CHOLHDL", "LDLDIRECT" in the last 72 hours. Thyroid Function Tests: No results for input(s): "TSH", "T4TOTAL", "FREET4", "T3FREE", "THYROIDAB" in the last 72 hours. Anemia Panel: No results for input(s): "VITAMINB12", "FOLATE", "FERRITIN", "TIBC", "IRON", "RETICCTPCT" in the last 72 hours. Sepsis Labs: No results for input(s): "PROCALCITON", "LATICACIDVEN" in the last 168 hours.  No results found for this or any previous visit (from the past 240 hour(s)).       Radiology Studies: CT Thoracic Spine Wo Contrast  Result Date: 01/21/2022 CLINICAL DATA:  Fall EXAM: CT THORACIC AND LUMBAR SPINE WITHOUT CONTRAST TECHNIQUE: Multidetector CT imaging of the thoracic and lumbar spine was performed without contrast. Multiplanar CT image reconstructions were also generated. RADIATION DOSE REDUCTION: This exam was performed according to the departmental dose-optimization program which includes automated exposure control, adjustment of the mA and/or kV according to patient size and/or use of iterative reconstruction technique. COMPARISON:  None Available. FINDINGS: CT THORACIC SPINE FINDINGS Alignment: Normal. Vertebrae: No acute fracture  or focal pathologic process. Spondylosis of the lower thoracic spine. Paraspinal and other soft tissues: Calcific aortic atherosclerosis. Disc levels: No high-grade spinal canal stenosis CT LUMBAR SPINE FINDINGS Segmentation: 5 lumbar type vertebrae. Alignment: Normal. Vertebrae: No acute fracture or focal pathologic process. Paraspinal and other soft tissues: Negative. Disc levels: There is moderate-to-severe spinal canal stenosis at the L2-5 levels. IMPRESSION: 1. No acute fracture or static subluxation of the thoracic or lumbar spine. 2. 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 3. Aortic Atherosclerosis (ICD10-I70.0). Electronically  Signed   By: Ulyses Jarred M.D.   On: 01/21/2022 02:12   CT Lumbar Spine Wo Contrast  Result Date: 01/21/2022 CLINICAL DATA:  Fall EXAM: CT THORACIC AND LUMBAR SPINE WITHOUT CONTRAST TECHNIQUE: Multidetector CT imaging of the thoracic and lumbar spine was performed without contrast. Multiplanar CT image reconstructions were also generated. RADIATION DOSE REDUCTION: This exam was performed according to the departmental dose-optimization program which includes automated exposure control, adjustment of the mA and/or kV according to patient size and/or use of iterative reconstruction technique. COMPARISON:  None Available. FINDINGS: CT THORACIC SPINE FINDINGS Alignment: Normal. Vertebrae: No acute fracture or focal pathologic process. Spondylosis of the lower thoracic spine. Paraspinal and other soft tissues: Calcific aortic atherosclerosis. Disc levels: No high-grade spinal canal stenosis CT LUMBAR SPINE FINDINGS Segmentation: 5 lumbar type vertebrae. Alignment: Normal. Vertebrae: No acute fracture or focal pathologic process. Paraspinal and other soft tissues: Negative. Disc levels: There is moderate-to-severe spinal canal stenosis at the L2-5 levels. IMPRESSION: 1. No acute fracture or static subluxation of the thoracic or lumbar spine. 2. 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 3. Aortic Atherosclerosis (ICD10-I70.0). Electronically Signed   By: Ulyses Jarred M.D.   On: 01/21/2022 02:12   CT HEAD WO CONTRAST (5MM)  Result Date: 01/21/2022 CLINICAL DATA:  Fall EXAM: CT HEAD WITHOUT CONTRAST CT CERVICAL SPINE WITHOUT CONTRAST TECHNIQUE: Multidetector CT imaging of the head and cervical spine was performed following the standard protocol without intravenous contrast. Multiplanar CT image reconstructions of the cervical spine were also generated. RADIATION DOSE REDUCTION: This exam was performed according to the departmental dose-optimization program which includes automated exposure  control, adjustment of the mA and/or kV according to patient size and/or use of iterative reconstruction technique. COMPARISON:  None Available. FINDINGS: CT HEAD FINDINGS Brain: There is no mass, hemorrhage or extra-axial collection. There is generalized atrophy without lobar predilection. There is hypoattenuation of the periventricular white matter, most commonly indicating chronic ischemic microangiopathy. Vascular: No abnormal hyperdensity of the major intracranial arteries or dural venous sinuses. No intracranial atherosclerosis. Skull: Large left frontal scalp hematoma.  No skull fracture. Sinuses/Orbits: No fluid levels or advanced mucosal thickening of the visualized paranasal sinuses. No mastoid or middle ear effusion. The orbits are normal. CT CERVICAL SPINE FINDINGS Alignment: No static subluxation. Facets are aligned. Occipital condyles are normally positioned. Skull base and vertebrae: There is a minimally displaced fracture of the C1 right superior articular process where it contacts the right occipital condyle. There is no other fracture of the cervical spine. Soft tissues and spinal canal: No prevertebral fluid or swelling. No visible canal hematoma. Disc levels: No advanced spinal canal or neural foraminal stenosis. Upper chest: No pneumothorax, pulmonary nodule or pleural effusion. Other: Normal visualized paraspinal cervical soft tissues. IMPRESSION: 1. Minimally displaced fracture of the C1 right superior articular process where it contacts the right occipital condyle. 2. No acute intracranial abnormality. 3. Large left frontal scalp hematoma without skull fracture. These  results were called by telephone at the time of interpretation on 01/21/2022 at 2:07 am to provider MADISON [facility] , who verbally acknowledged these results. Electronically Signed   By: Ulyses Jarred M.D.   On: 01/21/2022 02:07   CT Cervical Spine Wo Contrast  Result Date: 01/21/2022 CLINICAL DATA:  Fall EXAM: CT HEAD WITHOUT  CONTRAST CT CERVICAL SPINE WITHOUT CONTRAST TECHNIQUE: Multidetector CT imaging of the head and cervical spine was performed following the standard protocol without intravenous contrast. Multiplanar CT image reconstructions of the cervical spine were also generated. RADIATION DOSE REDUCTION: This exam was performed according to the departmental dose-optimization program which includes automated exposure control, adjustment of the mA and/or kV according to patient size and/or use of iterative reconstruction technique. COMPARISON:  None Available. FINDINGS: CT HEAD FINDINGS Brain: There is no mass, hemorrhage or extra-axial collection. There is generalized atrophy without lobar predilection. There is hypoattenuation of the periventricular white matter, most commonly indicating chronic ischemic microangiopathy. Vascular: No abnormal hyperdensity of the major intracranial arteries or dural venous sinuses. No intracranial atherosclerosis. Skull: Large left frontal scalp hematoma.  No skull fracture. Sinuses/Orbits: No fluid levels or advanced mucosal thickening of the visualized paranasal sinuses. No mastoid or middle ear effusion. The orbits are normal. CT CERVICAL SPINE FINDINGS Alignment: No static subluxation. Facets are aligned. Occipital condyles are normally positioned. Skull base and vertebrae: There is a minimally displaced fracture of the C1 right superior articular process where it contacts the right occipital condyle. There is no other fracture of the cervical spine. Soft tissues and spinal canal: No prevertebral fluid or swelling. No visible canal hematoma. Disc levels: No advanced spinal canal or neural foraminal stenosis. Upper chest: No pneumothorax, pulmonary nodule or pleural effusion. Other: Normal visualized paraspinal cervical soft tissues. IMPRESSION: 1. Minimally displaced fracture of the C1 right superior articular process where it contacts the right occipital condyle. 2. No acute intracranial  abnormality. 3. Large left frontal scalp hematoma without skull fracture. These results were called by telephone at the time of interpretation on 01/21/2022 at 2:07 am to provider MADISON [facility] , who verbally acknowledged these results. Electronically Signed   By: Ulyses Jarred M.D.   On: 01/21/2022 02:07   DG Shoulder Right  Result Date: 01/21/2022 CLINICAL DATA:  Fall, right shoulder pain EXAM: RIGHT SHOULDER - 2+ VIEW COMPARISON:  None Available. FINDINGS: Joints Fe cyst narrowing and spurring in the [facility] and glenohumeral joints. No acute bony abnormality. Specifically, no fracture, subluxation, or dislocation. Old healed right rib fractures. IMPRESSION: No acute bony abnormality. Electronically Signed   By: Rolm Baptise M.D.   On: 01/21/2022 01:43    Scheduled Meds:  amiodarone  200 mg Oral Daily   atorvastatin  20 mg Oral Daily   carvedilol  6.25 mg Oral BID   Chlorhexidine Gluconate Cloth  6 each Topical Q0600   escitalopram  10 mg Oral Daily   folic acid  1 mg Oral Daily   multivitamin with minerals  1 tablet Oral Daily   tamsulosin  0.4 mg Oral QHS   thiamine  100 mg Oral Daily   Or   thiamine  100 mg Intravenous Daily   torsemide  60 mg Oral Daily    LOS: 0 days   Time spent: 15mn  Cindel Daugherty C Lorna Strother, DO [facility]  If 7PM-7AM, please contact night-coverage [email or web address]  01/22/2022, 7:20 AM

## 2022-01-22 NOTE — Evaluation (Signed)
Physical Therapy Evaluation Patient Details Name: Darryl Diaz MRN: [id number] DOB: June 29, 1940 Today's Date: 01/22/2022  History of Present Illness  Pt is an 81 y/o M presenting to ED on 9/15 after fall attempting to get OOB, CT spine revealing C50f and L frontal scalp hematoma. PMH includes alcohol abuse, ESRD on HD MWF, CHF, HTN, and HLD.  Clinical Impression  Pt admitted with/for falls with C1 fx found in ED.  Pt shows significant instability and notable weakness in LE's especially seen in R hip musculature during standing and gait with the rollator and generally needs min assist to light moderate assist for safety..  Pt currently limited functionally due to the problems listed. ( See problems list.)   Pt will benefit from PT to maximize function and safety in order to get ready for next venue listed below.        Recommendations for follow up therapy are one component of a multi-disciplinary discharge planning process, led by the attending physician.  Recommendations may be updated based on patient status, additional functional criteria and insurance authorization.  Follow Up Recommendations Skilled nursing-short term rehab (<3 hours/day) Can patient physically be transported by private vehicle: Yes    Assistance Recommended at Discharge Intermittent Supervision/Assistance  Patient can return home with the following  A little help with walking and/or transfers;A little help with bathing/dressing/bathroom;Assist for transportation;Help with stairs or ramp for entrance    Equipment Recommendations None recommended by PT  Recommendations for Other Services       Functional Status Assessment Patient has had a recent decline in their functional status and demonstrates the ability to make significant improvements in function in a reasonable and predictable amount of time.     Precautions / Restrictions Precautions Precautions: Fall Required Braces or Orthoses: Cervical  Brace Cervical Brace: Hard collar;At all times Restrictions Weight Bearing Restrictions: No      Mobility  Bed Mobility Overal bed mobility: Needs Assistance Bed Mobility: Sidelying to Sit   Sidelying to sit: Min guard       General bed mobility comments: increased time; cues for safety    Transfers Overall transfer level: Needs assistance Equipment used: Rolling walker (2 wheels) Transfers: Sit to/from Stand Sit to Stand: Min assist (pt unable to stand from recline without assist even with significant rocking attempts.)           General transfer comment: increased cuing for safety    Ambulation/Gait Ambulation/Gait assistance: Min assist Gait Distance (Feet): 90 Feet Assistive device: Rollator (4 wheels) Gait Pattern/deviations: Step-through pattern   Gait velocity interpretation: <1.8 ft/sec, indicate of risk for recurrent falls   General Gait Details: generally unsteady, largely due to strength imbalances in the R hip at abd/ER.  Slow gait, flexed posture, notable "waggle" in the rollator suggesting not enough control from the AD.  Stairs            Wheelchair Mobility    Modified Rankin (Stroke Patients Only)       Balance Overall balance assessment: Needs assistance Sitting-balance support: Feet supported Sitting balance-Leahy Scale: Fair Sitting balance - Comments: can reach minimally outside BOS without LOB   Standing balance support: Reliant on assistive device for balance, During functional activity Standing balance-Leahy Scale: Poor Standing balance comment: reliant on external support and/or AD.  Rollator may not give enough support  Pertinent Vitals/Pain Pain Assessment Pain Assessment: No/denies pain    Home Living Family/patient expects to be discharged to:: Private residence Living Arrangements: Alone Available Help at Discharge: Family;Friend(s);Available PRN/intermittently Type of Home:  House Home Access: Level entry       Home Layout: One level Home Equipment: Conservation officer, nature (2 wheels);Rollator (4 wheels);Shower seat Additional Comments: pt reports has a service to help with groceries and running errands, this service needs a day's notice. Plans to go to [facility]F in a few weeks,    Prior Function Prior Level of Function : History of Falls (last six months);Needs assist             Mobility Comments: Uses rollator for mobility; reports mulitple falls in the past due to unsteadiness ADLs Comments: [employer] assists with transportation to store. Has transportation [facility]) to medical appointments and dialysis. Independent with ADLS does his own cooking; has gotten increasingly difficulty due to falls     Hand Dominance   Dominant Hand: Right    Extremity/Trunk Assessment   Upper Extremity Assessment Upper Extremity Assessment: Generalized weakness    Lower Extremity Assessment Lower Extremity Assessment: Generalized weakness;RLE deficits/detail RLE Deficits / Details: significant weakness in Hip ER/ABD,  moderate weakness hip flexors. RLE Coordination: decreased fine motor    Cervical / Trunk Assessment Cervical / Trunk Assessment: Normal  Communication   Communication: No difficulties  Cognition Arousal/Alertness: Awake/alert Behavior During Therapy: Impulsive, Restless Overall Cognitive Status: No family/caregiver present to determine baseline cognitive functioning Area of Impairment: Orientation, Attention, Memory, Following commands, Awareness, Safety/judgement, Problem solving                   Current Attention Level: Focused Memory: Decreased short-term memory Following Commands: Follows one step commands with increased time Safety/Judgement: Decreased awareness of deficits, Decreased awareness of safety Awareness: Emergent Problem Solving: Slow processing, Requires verbal cues, Requires tactile cues General Comments:  pt with decreased safety awareness        General Comments General comments (skin integrity, edema, etc.): VSS on RA    Exercises     Assessment/Plan    PT Assessment Patient needs continued PT services  PT Problem List Decreased strength;Decreased activity tolerance;Decreased balance;Decreased mobility;Decreased coordination;Decreased knowledge of use of DME       PT Treatment Interventions DME instruction;Gait training;Functional mobility training;Therapeutic activities;Balance training;Patient/family education;Stair training    PT Goals (Current goals can be found in the Care Plan section)  Acute Rehab PT Goals Patient Stated Goal: get to ALF PT Goal Formulation: With patient Time For Goal Achievement: 02/05/22 Potential to Achieve Goals: Good    Frequency Min 3X/week     Co-evaluation               AM-PAC PT "6 Clicks" Mobility  Outcome Measure Help needed turning from your back to your side while in a flat bed without using bedrails?: A Little Help needed moving from lying on your back to sitting on the side of a flat bed without using bedrails?: A Little Help needed moving to and from a bed to a chair (including a wheelchair)?: A Lot Help needed standing up from a chair using your arms (e.g., wheelchair or bedside chair)?: A Lot Help needed to walk in hospital room?: A Little Help needed climbing 3-5 steps with a railing? : A Little 6 Click Score: 16    End of Session   Activity Tolerance: Patient tolerated treatment well Patient left: in bed;with call bell/phone within  reach Nurse Communication: Mobility status PT Visit Diagnosis: Unsteadiness on feet (R26.81);Other abnormalities of gait and mobility (R26.89);Difficulty in walking, not elsewhere classified (R26.2)    Time: 1561-5379 PT Time Calculation (min) (ACUTE ONLY): 23 min   Charges:   PT Evaluation $PT Eval Moderate Complexity: 1 Mod PT Treatments $Gait Training: 8-22 mins         01/22/2022  Ginger Carne., PT Acute Rehabilitation Services [phone]  (office)  Tessie Fass Lyan Moyano 01/22/2022, 3:49 PM

## 2022-01-22 NOTE — Evaluation (Signed)
Occupational Therapy Evaluation Patient Details Name: Darryl Diaz MRN: [id number] DOB: [birth year] Today's Date: 01/22/2022   History of Present Illness Pt is an 81 y/o M presenting to ED on 9/15 after fall attempting to get OOB, CT spine revealing C77f and L frontal scalp hematoma. PMH includes alcohol abuse, ESRD on HD MWF, CHF, HTN, and HLD.   Clinical Impression   PTA, pt living alone using rollator for mobility, independent with ADLs, and uses transportation services to get to/from doctor appts and HD. Pt reports multiple falls at home due to poor balance. Pt needing min-mod A for ADLs, min guard for bed mobility and min A for transfers with RW. Pt impulsive, with poor safety awareness, attempting to stand prior to instruction and without RW nearby. [facility] reports will not be able to move furniture/items for 2 more weeks. Pt presenting with impairments listed below, will follow acutely. Recommend SNF at d/c.     Recommendations for follow up therapy are one component of a multi-disciplinary discharge planning process, led by the attending physician.  Recommendations may be updated based on patient status, additional functional criteria and insurance authorization.   Follow Up Recommendations  Skilled nursing-short term rehab (<3 hours/day)    Assistance Recommended at Discharge Frequent or constant Supervision/Assistance  Patient can return home with the following A little help with walking and/or transfers;A lot of help with bathing/dressing/bathroom;Assistance with cooking/housework;Direct supervision/assist for medications management;Direct supervision/assist for financial management;Assist for transportation;Help with stairs or ramp for entrance    Functional Status Assessment  Patient has had a recent decline in their functional status and demonstrates the ability to make significant improvements in function in a reasonable and predictable amount  of time.  Equipment Recommendations  None recommended by OT;Other (comment) (defer)    Recommendations for Other Services PT consult     Precautions / Restrictions Precautions Precautions: Fall Required Braces or Orthoses: Cervical Brace Cervical Brace: Hard collar;At all times Restrictions Weight Bearing Restrictions: No      Mobility Bed Mobility Overal bed mobility: Needs Assistance Bed Mobility: Sidelying to Sit   Sidelying to sit: Min guard       General bed mobility comments: increased time; cues for safety    Transfers Overall transfer level: Needs assistance Equipment used: Rolling walker (2 wheels) Transfers: Sit to/from Stand Sit to Stand: Min assist           General transfer comment: increased cuing for safety      Balance Overall balance assessment: Needs assistance Sitting-balance support: Feet supported Sitting balance-Leahy Scale: Fair Sitting balance - Comments: can reach minimally outside BOS without LOB   Standing balance support: Reliant on assistive device for balance, During functional activity Standing balance-Leahy Scale: Poor Standing balance comment: reliant on external support                           ADL either performed or assessed with clinical judgement   ADL Overall ADL's : Needs assistance/impaired Eating/Feeding: Modified independent   Grooming: Minimal assistance   Upper Body Bathing: Moderate assistance   Lower Body Bathing: Moderate assistance   Upper Body Dressing : Minimal assistance   Lower Body Dressing: Minimal assistance;Sitting/lateral leans;Sit to/from stand Lower Body Dressing Details (indicate cue type and reason): donning shoes Toilet Transfer: Minimal assistance;Rolling walker (2 wheels);Ambulation;Regular Toilet   Toileting- CWater quality scientistand Hygiene: Moderate assistance       Functional mobility  during ADLs: Minimal assistance;Rolling walker (2 wheels);Cueing for safety        Vision Baseline Vision/History: 1 Wears glasses Patient Visual Report: No change from baseline Additional Comments: 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, at times has blurred visiopn     Perception     Praxis      Pertinent Vitals/Pain Pain Assessment Pain Assessment: No/denies pain     Hand Dominance Right   Extremity/Trunk Assessment Upper Extremity Assessment Upper Extremity Assessment: Generalized weakness   Lower Extremity Assessment Lower Extremity Assessment: Defer to PT evaluation   Cervical / Trunk Assessment Cervical / Trunk Assessment: Normal   Communication Communication Communication: No difficulties   Cognition Arousal/Alertness: Awake/alert Behavior During Therapy: Impulsive, Restless Overall Cognitive Status: No family/caregiver present to determine baseline cognitive functioning Area of Impairment: Orientation, Attention, Memory, Following commands, Awareness, Safety/judgement, Problem solving                   Current Attention Level: Focused Memory: Decreased short-term memory Following Commands: Follows one step commands with increased time Safety/Judgement: Decreased awareness of deficits, Decreased awareness of safety Awareness: Emergent Problem Solving: Slow processing, Requires verbal cues, Requires tactile cues General Comments: pt with decreased safety awareness, needing multiple reminders to wait until instructed to stand, pt attempting to stand without RW     General Comments  VSS on RA    Exercises     Shoulder Instructions      Home Living Family/patient expects to be discharged to:: Private residence Living Arrangements: Alone Available Help at Discharge: Family;Friend(s);Available PRN/intermittently Type of Home: House Home Access: Level entry     Home Layout: One level     Bathroom Shower/Tub: Occupational psychologist: Standard Bathroom Accessibility: Yes How Accessible: Accessible via  walker Home Equipment: [place] (2 wheels);Rollator (4 wheels);Shower seat   Additional Comments: pt reports has a service to help with groceries and running errands, this service needs a day's notice. Plans to go to [facility] in a few weeks,      Prior Functioning/Environment Prior Level of Function : History of Falls (last six months);Needs assist             Mobility Comments: Uses rollator for mobility; reports mulitple falls in the past due to unsteadiness ADLs Comments: [employer] assists with transportation to store. Has transportation [facility]) to medical appointments and dialysis. Independent with ADLS does his own cooking; has gotten increasingly difficulty due to falls        OT Problem List: Decreased strength;Decreased range of motion;Decreased activity tolerance;Decreased coordination;Decreased cognition;Decreased safety awareness;Impaired UE functional use;Decreased knowledge of use of DME or AE      OT Treatment/Interventions: Self-care/ADL training;Therapeutic exercise;Energy conservation;DME and/or AE instruction;Therapeutic activities;Cognitive remediation/compensation;Patient/family education;Balance training    OT Goals(Current goals can be found in the care plan section) Acute Rehab OT Goals Patient Stated Goal: to move to [facility] OT Goal Formulation: With patient Time For Goal Achievement: 02/05/22 Potential to Achieve Goals: Good ADL Goals Pt Will Perform Upper Body Dressing: with modified independence;sitting Pt Will Perform Lower Body Dressing: with min assist;sitting/lateral leans;sit to/from stand Pt Will Transfer to Toilet: with supervision;ambulating;regular height toilet Pt Will Perform Tub/Shower Transfer: Shower transfer;shower seat;ambulating;with supervision Additional ADL Goal #1: pt will verbalize 3 fall prevention strategies in order promote safety at home  OT Frequency: Min 2X/week    Co-evaluation               AM-PAC OT "  6 Clicks" Daily Activity     Outcome Measure Help from another person eating meals?: None Help from another person taking care of personal grooming?: A Little Help from another person toileting, which includes using toliet, bedpan, or urinal?: A Lot Help from another person bathing (including washing, rinsing, drying)?: A Lot Help from another person to put on and taking off regular upper body clothing?: A Little Help from another person to put on and taking off regular lower body clothing?: A Lot 6 Click Score: 16   End of Session Equipment Utilized During Treatment: Gait belt;Rolling walker (2 wheels) Nurse Communication: Mobility status  Activity Tolerance: Patient tolerated treatment well Patient left: in chair;with call bell/phone within reach;with chair alarm set  OT Visit Diagnosis: Unsteadiness on feet (R26.81);Other abnormalities of gait and mobility (R26.89);Muscle weakness (generalized) (M62.81);Other symptoms and signs involving cognitive function                Time: 1025-1058 OT Time Calculation (min): 33 min Charges:  OT General Charges $OT Visit: 1 Visit OT Evaluation $OT Eval Moderate Complexity: 1 Mod OT Treatments $Self Care/Home Management : 8-22 mins  Lynnda Child, OTD, OTR/L Acute Rehab [phone]) [phone] - [place] 01/22/2022, 11:53 AM

## 2022-01-23 DIAGNOSIS — S12000A Unspecified displaced fracture of first cervical vertebra, initial encounter for closed fracture: Secondary | ICD-10-CM | POA: Diagnosis not present

## 2022-01-23 MED ORDER — DARBEPOETIN ALFA 100 MCG/0.5ML IJ SOSY
100.0000 ug | PREFILLED_SYRINGE | INTRAMUSCULAR | Status: DC
  Administered 2022-01-24: 100 ug via INTRAVENOUS

## 2022-01-23 NOTE — Progress Notes (Signed)
PROGRESS NOTE    KENTRELL HALLAHAN  LGX:[id number] DOB: 12-30-1940 DOA: 01/21/2022 PCP: [name], MD   Brief Narrative:  Darryl Diaz is a 81 y.o. male with medical history significant of ESRD on HD MWF, chronic diastolic CHF, paroxysmal A-fib not on anticoagulation due to recurrent falls, tachy-brady syndrome, hypertension, hyperlipidemia, anxiety/depression, GERD, chronic pancytopenia, history of GI bleed, alcohol abuse, alcoholic liver cirrhosis, OSA not on CPAP, history of cardiac arrest in 2020. Patient lives alone at home and presented to the ED tonight via EMS after he had a fall while attempting to get in bed - notes he lost balance without LOC.  CT C-spine showing minimally displaced fracture of the C1 right superior articular process. CT head showing large left frontal scalp hematoma without skull fracture or any acute intracranial abnormality.  Remainder of trauma scans including x-ray of right shoulder and CT of lumbar and thoracic spine negative for acute traumatic injuries. Placed in [place] [facility] collar and neurosurgery consulted (Dr. Christella Noa) who recommends to remain in collar with outpatient follow up in clinic (no indication for intervention based on their evaluation).  Patient medically stable for discharge - awaiting safe disposition to SNF. (Initial plan was home with HHPT - he has plans to move into assisted living facility later this week but given unsafe disposition home even with home health will transition to SNF).   Assessment & Plan:   Principal Problem:   C1 cervical fracture (HCC) Active Problems:   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HCC)   Paroxysmal atrial fibrillation (HCC)   Hypertension   Hyperlipidemia   Anemia, chronic disease   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HCC)   Chronic diastolic CHF (congestive heart failure) (HCC)   Alcohol abuse   Minimally displaced fracture of the C1 right superior articular process -Secondary to  mechanical fall, denies LOC; does drink occasionally -He is not on any antiplatelet agents or anticoagulation. -Continue [place] collar per neuroSx until office follow up *(reeducated on the 16th as he was found in bed with his collar off - " I took it off to eat lunch").   Recurrent falls, ambulatory dysfunction Complicated by alcohol use -Patient currently living alone at home  -PT/OT recommending SNF   ESRD on HD MWF Chronic diastolic CHF -HD per nephrology   Elevated transaminases Alcoholic liver cirrhosis Alcohol use disorder -Alcohol use daily - follow labs/symptoms -CIWA monitoring -Thiamine, folate, multivitamin ongoing   Chronic anemia -Hemoglobin 8.8, no significant change from baseline -Has a large forehead hematoma/laceration but no other obvious bleeding -Continue to monitor   Chronic thrombocytopenia -Platelet count 99k -Continue to monitor   Large left frontal scalp hematoma with forehead laceration -No skull fracture or acute intracranial abnormality on CT -Forehead laceration repaired per ED   QT prolongation -Cardiac monitoring -Check magnesium level -Avoid QT prolonging drugs -Repeat EKG in a.m.   Paroxysmal A-fib: Not on anticoagulation due to recurrent falls. 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Hypertension Hyperlipidemia -Continue home amiodarone, carvedilol, statin  Anxiety/depression -Continue home lexapro  BPH -Continue tamsulosin  GERD -Well controlled  DVT prophylaxis: None given bleeding as above Code Status: DNR Family Communication: None present  Status is: Inpt  Dispo: The patient is from: Home              Anticipated d/c is to: SNF              Anticipated d/c date is: Imminent  Patient currently medically stable for discharge  Consultants:  NeuroSx  Procedures:  None  Antimicrobials:  None   Subjective: No acute issue/events overnight, noted to have collar off yesterday per staff.   Reeducated  Objective: Vitals:   01/22/22 1559 01/22/22 1900 01/23/22 0000 01/23/22 0400  BP: (!) 124/58 (!) 127/52 (!) 137/58 130/80  Pulse: 64 61 64 61  Resp: 20 20    Temp: 98.1 F (36.7 C) 98.4 F (36.9 C) 97.9 F (36.6 C) 98.1 F (36.7 C)  TempSrc: Oral Oral Oral Oral  SpO2: 100% 100% 97% 99%  Weight:      Height:        Intake/Output Summary (Last 24 hours) at 01/23/2022 0803 Last data filed at 01/22/2022 2000 Gross per 24 hour  Intake 120 ml  Output 600 ml  Net -480 ml    Filed Weights   01/21/22 0033  Weight: 83 kg    Examination:  General exam: Appears calm and comfortable  Respiratory system: Clear to auscultation. Respiratory effort normal. Cardiovascular system: S1 & S2 heard, RRR. No JVD, murmurs, rubs, gallops or clicks. No pedal edema. Gastrointestinal system: Abdomen is nondistended, soft and nontender. No organomegaly or masses felt. Normal bowel sounds heard. Central nervous system: Alert and oriented. No focal neurological deficits. Extremities: Symmetric 5 x 5 power. Skin: Large forehead hematoma/laceration  Data Reviewed: I have personally reviewed following labs and imaging studies  CBC: Recent Labs  Lab 01/21/22 0027 01/21/22 0911 01/22/22 0458  WBC 4.4 4.2 3.1*  NEUTROABS 2.8  --   --   HGB 8.8* 8.5* 8.6*  HCT 25.2* 25.9* 25.4*  MCV 103.3* 108.4* 105.4*  PLT 99* 99* 96*    Basic Metabolic Panel: Recent Labs  Lab 01/21/22 0027 01/21/22 0911 01/22/22 0458  NA 133* 134* 137  K 3.4* 4.0 3.3*  CL 96* 97* 97*  CO2 '23 27 27  '$ GLUCOSE 88 93 86  BUN 37* 36* 16  CREATININE 3.35* 3.32* 1.96*  CALCIUM 9.3 9.0 9.0  MG  --  2.1  --     GFR: Estimated Creatinine Clearance: 33 mL/min (A) (by C-G formula based on SCr of 1.96 mg/dL (H)). Liver Function Tests: Recent Labs  Lab 01/21/22 0027  AST 289*  ALT 107*  ALKPHOS 107  BILITOT 1.3*  PROT 5.6*  ALBUMIN 3.3*    No results for input(s): "LIPASE", "AMYLASE" in the last 168  hours. No results for input(s): "AMMONIA" in the last 168 hours. Coagulation Profile: No results for input(s): "INR", "PROTIME" in the last 168 hours. Cardiac Enzymes: No results for input(s): "CKTOTAL", "CKMB", "CKMBINDEX", "TROPONINI" in the last 168 hours. BNP (last 3 results) No results for input(s): "PROBNP" in the last 8760 hours. HbA1C: No results for input(s): "HGBA1C" in the last 72 hours. CBG: No results for input(s): "GLUCAP" in the last 168 hours. Lipid Profile: No results for input(s): "CHOL", "HDL", "LDLCALC", "TRIG", "CHOLHDL", "LDLDIRECT" in the last 72 hours. Thyroid Function Tests: No results for input(s): "TSH", "T4TOTAL", "FREET4", "T3FREE", "THYROIDAB" in the last 72 hours. Anemia Panel: No results for input(s): "VITAMINB12", "FOLATE", "FERRITIN", "TIBC", "IRON", "RETICCTPCT" in the last 72 hours. Sepsis Labs: No results for input(s): "PROCALCITON", "LATICACIDVEN" in the last 168 hours.  No results found for this or any previous visit (from the past 240 hour(s)).       Radiology Studies: No results found.  Scheduled Meds:  amiodarone  200 mg Oral Daily   atorvastatin  20 mg Oral Daily  carvedilol  6.25 mg Oral BID   Chlorhexidine Gluconate Cloth  6 each Topical Q0600   escitalopram  10 mg Oral Daily   folic acid  1 mg Oral Daily   multivitamin with minerals  1 tablet Oral Daily   potassium chloride  20 mEq Oral Once   tamsulosin  0.4 mg Oral QHS   thiamine  100 mg Oral Daily   Or   thiamine  100 mg Intravenous Daily   torsemide  60 mg Oral Daily    LOS: 1 day   Time spent: 16mn  Neizan Debruhl C Sovereign Ramiro, DO [facility]  If 7PM-7AM, please contact night-coverage [email or web address]  01/23/2022, 8:03 AM

## 2022-01-23 NOTE — Progress Notes (Signed)
Admit: 01/21/2022 LOS: 1  75M ESRD MWF with fall and C1 Fx and Scalp Hematoma  Subjective:  In good spirits, eating breakfast in chair No interval events  09/16 0701 - 09/17 0700 In: 120 [P.O.:120] Out: 600 [Urine:600]  Filed Weights   01/21/22 0033  Weight: 83 kg    Scheduled Meds:  amiodarone  200 mg Oral Daily   atorvastatin  20 mg Oral Daily   carvedilol  6.25 mg Oral BID   Chlorhexidine Gluconate Cloth  6 each Topical Q0600   escitalopram  10 mg Oral Daily   folic acid  1 mg Oral Daily   multivitamin with minerals  1 tablet Oral Daily   potassium chloride  20 mEq Oral Once   tamsulosin  0.4 mg Oral QHS   thiamine  100 mg Oral Daily   Or   thiamine  100 mg Intravenous Daily   torsemide  60 mg Oral Daily   Continuous Infusions:  anticoagulant sodium citrate     PRN Meds:.alteplase, anticoagulant sodium citrate, heparin, HYDROmorphone (DILAUDID) injection, lidocaine (PF), lidocaine-prilocaine, pentafluoroprop-tetrafluoroeth  Current Labs: reviewed   Physical Exam:  Blood pressure (!) 114/99, pulse (!) 58, temperature 97.8 F (36.6 C), temperature source Oral, resp. rate 17, height 6' (1.829 m), weight 83 kg, SpO2 100 %. General: Elderly man, no acute distress; in chair Head: Large scalp/forehead laceration; left eye bruising  Neck: in cervical collar  Lungs: Clear bilaterally without wheezes, rales, or rhonchi. Normal WOB  Heart: RRR, no murmur, rub, or gallop  Abdomen: soft non-tender, bowel sounds normal, no masses  Lower extremities:without edema or ischemic changes, no open wounds  Neuro: A & O X 3. Moves all extremities spontaneously. Psych:  Responds to questions appropriately with a normal affect. Dialysis Access: LUE AVF +bruit; bruising to upper L arm    Dialysis Orders:  Unit: AF MWF Time: 4:00  EDW: 83.5kg  Flows: 400 A1.5x  Bath: 2K/2Ca  Access: AVF  Heparin: None Mircera 50 q 2 wks (last 9/4) VDRA: None   Assessment/Plan: C1  fracture/scalp hematoma d/t fall at home: Management per primary team and NSG. Lives alone with hx of recurrent falls. Needs SNF placement, patient agrees.  In process.   ESRD -  HD MWF. Continue on schedule. HD 9/18: 3K 4h 1-2L UF, no heparin  Hypertension/volume  - BP/volume acceptable. UF to EDW as able, will hope for standing weights next week  Anemia  - Hgb 8.6. Next ESA due 2/83  Metabolic bone disease -  Ca acceptable. No VDRA.   Nutrition - Renal diet with fluid restriction  Medication Issues; Preferred narcotic agents for pain control are hydromorphone, fentanyl, and methadone. Morphine should not be used.  Baclofen should be avoided Avoid oral sodium phosphate and magnesium citrate based laxatives / bowel preps    Pearson Grippe MD 01/23/2022, 10:15 AM  Recent Labs  Lab 01/21/22 0027 01/21/22 0911 01/22/22 0458  NA 133* 134* 137  K 3.4* 4.0 3.3*  CL 96* 97* 97*  CO2 '23 27 27  '$ GLUCOSE 88 93 86  BUN 37* 36* 16  CREATININE 3.35* 3.32* 1.96*  CALCIUM 9.3 9.0 9.0    Recent Labs  Lab 01/21/22 0027 01/21/22 0911 01/22/22 0458  WBC 4.4 4.2 3.1*  NEUTROABS 2.8  --   --   HGB 8.8* 8.5* 8.6*  HCT 25.2* 25.9* 25.4*  MCV 103.3* 108.4* 105.4*  PLT 99* 99* 96*

## 2022-01-23 NOTE — Progress Notes (Signed)
Pt self-removed [place] collar. This nurse placed it back on him & reminded him that it needs to be worn at all times per neurology. Pt states that he will not remove it again.

## 2022-01-23 NOTE — Progress Notes (Signed)
Physical Therapy Treatment Patient Details Name: Darryl Diaz MRN: [id number] DOB: [birth year] Today's Date: 01/23/2022   History of Present Illness Pt is an 81 y/o M presenting to ED on 9/15 after fall attempting to get OOB, CT spine revealing C23f and L frontal scalp hematoma. PMH includes alcohol abuse, ESRD on HD MWF, CHF, HTN, and HLD.    PT Comments    Pt making good progress toward goals.  Emphasis on transitions, scooting, sitting balance, symmetrical LE postioning with sit to stand, sitting standing exercise and progression of gait with the RW vs rollator working on balance position of R hip/LE during swing though and overall gait stability.    Recommendations for follow up therapy are one component of a multi-disciplinary discharge planning process, led by the attending physician.  Recommendations may be updated based on patient status, additional functional criteria and insurance authorization.  Follow Up Recommendations  Skilled nursing-short term rehab (<3 hours/day) Can patient physically be transported by private vehicle: Yes   Assistance Recommended at Discharge Intermittent Supervision/Assistance  Patient can return home with the following A little help with walking and/or transfers;A little help with bathing/dressing/bathroom;Assist for transportation;Help with stairs or ramp for entrance   Equipment Recommendations  None recommended by PT    Recommendations for Other Services       Precautions / Restrictions Precautions Precautions: Fall Required Braces or Orthoses: Cervical Brace Cervical Brace: Hard collar;At all times     Mobility  Bed Mobility Overal bed mobility: Needs Assistance Bed Mobility: Supine to Sit     Supine to sit: Min guard     General bed mobility comments: less a roll to side and more up via L elbow without assist, but HOB at 25 degrees.    Transfers Overall transfer level: Needs assistance Equipment used: Rolling walker (2  wheels) Transfers: Sit to/from Stand Sit to Stand: Min guard, From elevated surface           General transfer comment: cues for hand placement, R hip symmetry and safe technique. No assist today    Ambulation/Gait Ambulation/Gait assistance: Min guard Gait Distance (Feet): 140 Feet (then additional 70 feet after sitting rest.) Assistive device: Rolling walker (2 wheels) Gait Pattern/deviations: Step-through pattern   Gait velocity interpretation: <1.8 ft/sec, indicate of risk for recurrent falls   General Gait Details: noticeably more control with RW than rollator, cues for improved R hee/toe pattern, slowing, but improving gait. Overall still mildly unsteady/uncoordinated even with RW.  And definitely in part due to muscular imbalance in R hip.   Stairs             Wheelchair Mobility    Modified Rankin (Stroke Patients Only)       Balance Overall balance assessment: Needs assistance Sitting-balance support: Feet supported Sitting balance-Leahy Scale: Fair Sitting balance - Comments: can reach minimally outside BOS without LOB   Standing balance support: Reliant on assistive device for balance, During functional activity Standing balance-Leahy Scale: Poor Standing balance comment: reliant on external support and/or AD.  Rollator may not give enough support                            Cognition Arousal/Alertness: Awake/alert Behavior During Therapy: WFL for tasks assessed/performed Overall Cognitive Status: No family/caregiver present to determine baseline cognitive functioning  Exercises General Exercises - Lower Extremity Long Arc Quad: AROM, Both, 10 reps, Seated Hip Flexion/Marching: AROM, Strengthening, Both, Standing, 15 reps (assist at R LE to improve symmetry and line of action.)    General Comments        Pertinent Vitals/Pain Pain Assessment Pain Assessment: No/denies pain     Home Living                          Prior Function            PT Goals (current goals can now be found in the care plan section) Acute Rehab PT Goals PT Goal Formulation: With patient Time For Goal Achievement: 02/05/22 Potential to Achieve Goals: Good Progress towards PT goals: Progressing toward goals    Frequency    Min 3X/week      PT Plan Current plan remains appropriate    Co-evaluation              AM-PAC PT "6 Clicks" Mobility   Outcome Measure  Help needed turning from your back to your side while in a flat bed without using bedrails?: A Little Help needed moving from lying on your back to sitting on the side of a flat bed without using bedrails?: A Little Help needed moving to and from a bed to a chair (including a wheelchair)?: A Little Help needed standing up from a chair using your arms (e.g., wheelchair or bedside chair)?: A Little Help needed to walk in hospital room?: A Little Help needed climbing 3-5 steps with a railing? : A Lot 6 Click Score: 17    End of Session   Activity Tolerance: Patient tolerated treatment well Patient left: in bed;with call bell/phone within reach Nurse Communication: Mobility status PT Visit Diagnosis: Other abnormalities of gait and mobility (R26.89);Muscle weakness (generalized) (M62.81);Difficulty in walking, not elsewhere classified (R26.2)     Time: 8889-1694 PT Time Calculation (min) (ACUTE ONLY): 22 min  Charges:                        01/23/2022  Ginger Carne., PT Acute Rehabilitation Services [phone]  (office)   Tessie Fass Meleana Commerford 01/23/2022, 1:22 PM

## 2022-01-24 DIAGNOSIS — S12000A Unspecified displaced fracture of first cervical vertebra, initial encounter for closed fracture: Secondary | ICD-10-CM | POA: Diagnosis not present

## 2022-01-24 LAB — RENAL FUNCTION PANEL
Albumin: 2.9 g/dL — ABNORMAL LOW (ref 3.5–5.0)
Anion gap: 10 (ref 5–15)
BUN: 47 mg/dL — ABNORMAL HIGH (ref 8–23)
CO2: 26 mmol/L (ref 22–32)
Calcium: 8.8 mg/dL — ABNORMAL LOW (ref 8.9–10.3)
Chloride: 100 mmol/L (ref 98–111)
Creatinine, Ser: 3.73 mg/dL — ABNORMAL HIGH (ref 0.61–1.24)
GFR, Estimated: 16 mL/min — ABNORMAL LOW (ref >60)
Glucose, Bld: 166 mg/dL — ABNORMAL HIGH (ref 70–99)
Phosphorus: 2 mg/dL — ABNORMAL LOW (ref 2.5–4.6)
Potassium: 3.4 mmol/L — ABNORMAL LOW (ref 3.5–5.1)
Sodium: 136 mmol/L (ref 135–145)

## 2022-01-24 LAB — CBC
HCT: 22.6 % — ABNORMAL LOW (ref 39.0–52.0)
HCT: 23.5 % — ABNORMAL LOW (ref 39.0–52.0)
Hemoglobin: 8 g/dL — ABNORMAL LOW (ref 13.0–17.0)
Hemoglobin: 8.2 g/dL — ABNORMAL LOW (ref 13.0–17.0)
MCH: 36.9 pg — ABNORMAL HIGH (ref 26.0–34.0)
MCH: 36.3 pg — ABNORMAL HIGH (ref 26.0–34.0)
MCHC: 35.4 g/dL (ref 30.0–36.0)
MCHC: 34.9 g/dL (ref 30.0–36.0)
MCV: 104.1 fL — ABNORMAL HIGH (ref 80.0–100.0)
MCV: 104 fL — ABNORMAL HIGH (ref 80.0–100.0)
Platelets: 91 10*3/uL — ABNORMAL LOW (ref 150–400)
Platelets: 94 10*3/uL — ABNORMAL LOW (ref 150–400)
RBC: 2.17 MIL/uL — ABNORMAL LOW (ref 4.22–5.81)
RBC: 2.26 MIL/uL — ABNORMAL LOW (ref 4.22–5.81)
RDW: 15.9 % — ABNORMAL HIGH (ref 11.5–15.5)
RDW: 15.9 % — ABNORMAL HIGH (ref 11.5–15.5)
WBC: 3.5 10*3/uL — ABNORMAL LOW (ref 4.0–10.5)
WBC: 3.2 10*3/uL — ABNORMAL LOW (ref 4.0–10.5)
nRBC: 0 % (ref 0.0–0.2)
nRBC: 0 % (ref 0.0–0.2)

## 2022-01-24 LAB — BASIC METABOLIC PANEL
Anion gap: 7 (ref 5–15)
BUN: 45 mg/dL — ABNORMAL HIGH (ref 8–23)
CO2: 27 mmol/L (ref 22–32)
Calcium: 8.6 mg/dL — ABNORMAL LOW (ref 8.9–10.3)
Chloride: 104 mmol/L (ref 98–111)
Creatinine, Ser: 3.73 mg/dL — ABNORMAL HIGH (ref 0.61–1.24)
GFR, Estimated: 16 mL/min — ABNORMAL LOW (ref >60)
Glucose, Bld: 112 mg/dL — ABNORMAL HIGH (ref 70–99)
Potassium: 3.3 mmol/L — ABNORMAL LOW (ref 3.5–5.1)
Sodium: 138 mmol/L (ref 135–145)

## 2022-01-24 MED ORDER — DARBEPOETIN ALFA 100 MCG/0.5ML IJ SOSY
PREFILLED_SYRINGE | INTRAMUSCULAR | Status: AC
  Filled 2022-01-24: qty 0.5

## 2022-01-24 MED ORDER — PENTAFLUOROPROP-TETRAFLUOROETH EX AERO
INHALATION_SPRAY | CUTANEOUS | Status: AC
  Filled 2022-01-24: qty 30

## 2022-01-24 NOTE — Consult Note (Addendum)
Hospital Consult    Reason for Consult:  fistula malfunctioning Requesting Physician:  Juanell Fairly NP MRN #:  [id number]  History of Present Illness: This is a very pleasant 81 y.o. male with PMH significant for ESRD on HD MWF via Left upper extremity AVF, CHF, PAF, alcohol use disorder, HTN, HLD, GERD who presented after fall at home.He has had multiple falls recently. On presentation he was found to have large scalp hematoma and minimally displaced C1 fracture. His last dialysis session was 9/11 on presentation as he had missed his 9/13 session due to transportation issues. He dialyzed as an inpatient on 9/15 with no reported issues. He explains that his fistula has been working well. He has had no pain or bleeding issues. No short treatment sessions or issues with fistula not functioning. However today at his morning session he was noted to have high venous pressures and tech pulled lots of clots. They were unable to continue his treatment. Vascular surgery was therefore consulted for evaluation.   Past Medical History:  Diagnosis Date   Alcohol abuse 82/99/3716   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[place]) 03/17/2020   Alcoholism ([place])    in remission following wife's death   Anemia    Anxiety    Ascending aorta dilation (HCC)    Atrial fibrillation (HCC)    Cardiac arrest ([place]) 04/20/2019   12 min CPR with epi   Chronic diastolic CHF (congestive heart failure) ([place])    Dysrhythmia    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[place])    Mon Wed Fri   Fatty liver    Fracture of hip ([place]) 03/30/2016   Gastritis    GERD (gastroesophageal reflux disease)    H/O seasonal allergies    Heme positive stool 05/14/2020   History of blood transfusion    History of GI bleed    Hx of adenomatous colonic polyps 08/10/2017   Hyperlipidemia    Hypertension    Insomnia    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   Mallory-Weiss tear    OA (osteoarthritis)    hands   OSA (obstructive  sleep apnea)    No longer uses CPAP   Persistent atrial fibrillation with rapid ventricular response ([place]) 02/05/2020   Posterior dislocation of hip, closed ([place]) 04/30/2016   Recurrent syncope    Restless legs syndrome (RLS)    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([place])     Past Surgical History:  Procedure Laterality Date   AMPUTATION Left 05/19/2021   Procedure: LEFT GREAT TOE AMPUTATION;  Surgeon: Newt Minion, MD;  Location: [facility];  Service: Orthopedics;  Laterality: Left;   ANKLE SURGERY Left 2014   had rods put in    AV FISTULA PLACEMENT Left 04/21/2021   Procedure: LEFT ARM ARTERIOVENOUS (AV) FISTULA CREATION - Left brachiocephalic fistula;  Surgeon: Marty Heck, MD;  Location: [facility];  Service: Vascular;  Laterality: Left;   BIOPSY  02/28/2019   Procedure: BIOPSY;  Surgeon: Milus Banister, MD;  Location: [facility];  Service: Endoscopy;;   CARDIOVERSION  02/06/2020   CARDIOVERSION N/A 02/06/2020   Procedure: CARDIOVERSION;  Surgeon: Werner Lean, MD;  Location: [facility]OSCOPY;  Service: Cardiovascular;  Laterality: N/A;   CARDIOVERSION N/A 02/24/2020   Procedure: CARDIOVERSION;  Surgeon: Werner Lean, MD;  Location: [facility]OSCOPY;  Service: Cardiovascular;  Laterality: N/A;   COLONOSCOPY     [place]   COLONOSCOPY WITH PROPOFOL N/A 01/08/2021   Procedure: COLONOSCOPY WITH PROPOFOL;  Surgeon: Milus Banister, MD;  Location[facility] ENDOSCOPY;  Service: Endoscopy;  Laterality: N/A;   ESOPHAGOGASTRODUODENOSCOPY (EGD) WITH PROPOFOL N/A 02/28/2019   Procedure: ESOPHAGOGASTRODUODENOSCOPY (EGD) WITH PROPOFOL;  Surgeon: Milus Banister, MD;  Location: [facility] ENDOSCOPY;  Service: Endoscopy;  Laterality: N/A;   FRACTURE SURGERY     HIP ARTHROPLASTY Left 04/01/2016   Procedure: ARTHROPLASTY BIPOLAR HIP (HEMIARTHROPLASTY);  Surgeon: Paralee Cancel, MD;  Location: [facility];  Service: Orthopedics;  Laterality: Left;   HIP CLOSED REDUCTION Left 04/30/2016   Procedure: CLOSED  REDUCTION HIP;  Surgeon: Wylene Simmer, MD;  Location: [facility];  Service: Orthopedics;  Laterality: Left;   HOT HEMOSTASIS N/A 01/08/2021   Procedure: HOT HEMOSTASIS (ARGON PLASMA COAGULATION/BICAP);  Surgeon: Milus Banister, MD;  [facility];  Service: Endoscopy;  Laterality: N/A;   IR RADIOLOGIST EVAL & MGMT  12/31/2020   JOINT REPLACEMENT     TONSILLECTOMY     TOTAL HIP ARTHROPLASTY Right 10/15/2019   Procedure: TOTAL HIP ARTHROPLASTY ANTERIOR APPROACH;  Surgeon: Paralee Cancel, MD;  Location: [facility];  Service: Orthopedics;  Laterality: Right;  70 mins   VASCULAR SURGERY      Allergies  Allergen Reactions   Penicillin G Sodium Rash and Other (See Comments)     (tolerates Keflex)   Penicillins Rash    Prior to Admission medications   Medication Sig Start Date End Date Taking? Authorizing Provider  amiodarone (PACERONE) 200 MG tablet TAKE 1 TABLET BY MOUTH ONCE DAILY Patient taking differently: Take 200 mg by mouth every morning. 05/17/21  Yes Weaver, Scott T, PA-C  atorvastatin (LIPITOR) 20 MG tablet Take 1 tablet (20 mg total) by mouth daily. 03/10/20  Yes Angiulli, Lavon Paganini, PA-C  carvedilol (COREG) 6.25 MG tablet Take 1 tablet (6.25 mg total) by mouth 2 (two) times daily. Patient taking differently: Take 12.5 mg by mouth daily. 02/11/21  Yes Sherren Mocha, MD  cetirizine (ZYRTEC) 10 MG tablet Take 10 mg by mouth daily.   Yes [provider]  escitalopram (LEXAPRO) 10 MG tablet Take 1 tablet (10 mg total) by mouth daily. Patient taking differently: Take 10 mg by mouth every morning. 03/10/20  Yes Angiulli, Lavon Paganini, PA-C  ferrous sulfate 324 MG TBEC Take 324 mg by mouth every morning.   Yes [provider]  FOLIC ACID PO Take 1 tablet by mouth every morning.   Yes [provider]  Magnesium Oxide 400 MG CAPS Take 1 capsule (400 mg total) by mouth daily. 10/17/20  Yes Eugenie Filler, MD  Multiple Vitamin (MULTIVITAMIN PO) Take 1 tablet by mouth  every other day.   Yes [provider]  nitroGLYCERIN (NITROSTAT) 0.4 MG SL tablet Place 1 tablet (0.4 mg total) under the tongue every 5 (five) minutes x 3 doses as needed for chest pain. 07/28/20  Yes Baldwin Jamaica, PA-C  pantoprazole (PROTONIX) 40 MG tablet Take 1 tablet (40 mg total) by mouth daily. Patient taking differently: Take 40 mg by mouth every morning. 03/10/20  Yes Angiulli, Lavon Paganini, PA-C  sodium bicarbonate 650 MG tablet Take 650 mg by mouth daily.   Yes [provider]  tamsulosin (FLOMAX) 0.4 MG CAPS capsule Take 0.4 mg by mouth at bedtime.   Yes [provider]  torsemide (DEMADEX) 20 MG tablet Take 60 mg by mouth daily.   Yes [provider]  traZODone (DESYREL) 50 MG tablet Take 1 tablet (50 mg total) by mouth at bedtime. 11/26/21  Yes Lesia Sago  J, MD    Social History   Socioeconomic History   Marital status: Widowed    Spouse name: Not on file   Number of children: 2   Years of education: college   Highest education level: Not on file  Occupational History   Occupation: retired  Tobacco Use   Smoking status: Former    Packs/day: 1.00    Years: 20.00    Total pack years: 20.00    Types: Cigarettes   Smokeless tobacco: Never   Tobacco comments:       quit 20 years  smoked on and off for 20 years  Vaping Use   Vaping Use: Never used  Substance and Sexual Activity   Alcohol use: Not Currently    Alcohol/week: 3.0 standard drinks of alcohol    Types: 3 Glasses of wine per week    Comment: h/o heavy use   Drug use: Never   Sexual activity: Not Currently  Other Topics Concern   Not on file  Social History Narrative   ** Merged History Encounter **       The patient is divorced w/ 1 son 1 daughter He is a retired Regulatory affairs officer, he lived in [place] but [employer] including windows on the world in the [place] room in [place] He moved to [place] to be near his sister who liv   es in  [place] alcoholic member of AA 2 caffeinated beverages daily prior smoker no drugs   Social Determinants of Radio broadcast assistant Strain: Low Risk  (10/23/2020)   Overall Financial Resource Strain (CARDIA)    Difficulty of Paying Living Expenses: Not hard at all  Food Insecurity: No Food Insecurity (10/23/2020)   Hunger Vital Sign    Worried About Running Out of Food in the Last Year: Never true    [place] in the Last Year: Never true  Transportation Needs: No Transportation Needs (10/23/2020)   PRAPARE - Hydrologist (Medical): No    Lack of Transportation (Non-Medical): No  Physical Activity: Not on file  Stress: No Stress Concern Present (10/23/2020)   [place]    Feeling of Stress : Not at all  Social Connections: Moderately Integrated (10/23/2020)   Social Connection and Isolation Panel [NHANES]    Frequency of Communication with Friends and Family: More than three times a week    Frequency of Social Gatherings with Friends and Family: More than three times a week    Attends Religious Services: 1 to 4 times per year    Active Member of [employer] or Organizations: No    Attends Archivist Meetings: 1 to 4 times per year    Marital Status: Widowed  Intimate Partner Violence: Not At Risk (10/23/2020)   Humiliation, Afraid, Rape, and Kick questionnaire    Fear of Current or Ex-Partner: No    Emotionally Abused: No    Physically Abused: No    Sexually Abused: No     Family History  Problem Relation Age of Onset   Heart disease Mother 62   Hypertension Mother    Arthritis Mother    Heart failure Father 69   Stroke Maternal Aunt    Heart failure Sister    Colon cancer Neg Hx    Esophageal cancer Neg Hx    Stomach cancer Neg Hx    Rectal cancer Neg Hx  ROS: Otherwise negative unless mentioned in HPI  Physical Examination  Vitals:    01/24/22 0741 01/24/22 0941  BP: (!) 102/48 (!) 101/51  Pulse: 60 (!) 55  Resp: 16 16  Temp: 98 F (36.7 C) 98.2 F (36.8 C)  SpO2: 100% 99%   Body mass index is 24.82 kg/m.  General:  WDWN in NAD; in c- collar Gait: Not observed HENT: WNL, normocephalic Pulmonary: normal non-labored breathing Cardiac: regular Vascular Exam/Pulses: left AV fistula with good thrill, some pulsatility. Overlying ecchymosis. Just came off of HD so dressings in place. No active bleeding. 2+ left radial pulse. Left hand warm and well perfused Musculoskeletal: no muscle wasting or atrophy  Neurologic: A&O X 3;  No focal weakness or paresthesias are detected; speech is fluent/normal Psychiatric:  The pt has Normal affect.  CBC    Component Value Date/Time   WBC 3.1 (L) 01/22/2022 0458   RBC 2.41 (L) 01/22/2022 0458   HGB 8.6 (L) 01/22/2022 0458   HGB 10.2 (L) 06/03/2020 1217   HCT 25.4 (L) 01/22/2022 0458   HCT 31.0 (L) 06/03/2020 1217   PLT 96 (L) 01/22/2022 0458   PLT 163 06/03/2020 1217   MCV 105.4 (H) 01/22/2022 0458   MCV 93 06/03/2020 1217   MCH 35.7 (H) 01/22/2022 0458   MCHC 33.9 01/22/2022 0458   RDW 15.6 (H) 01/22/2022 0458   RDW 14.6 06/03/2020 1217   LYMPHSABS 0.9 01/21/2022 0027   MONOABS 0.6 01/21/2022 0027   EOSABS 0.0 01/21/2022 0027   BASOSABS 0.0 01/21/2022 0027    BMET    Component Value Date/Time   NA 136 01/24/2022 0817   NA 133 (L) 09/03/2020 1612   K 3.4 (L) 01/24/2022 0817   CL 100 01/24/2022 0817   CO2 26 01/24/2022 0817   GLUCOSE 166 (H) 01/24/2022 0817   BUN 47 (H) 01/24/2022 0817   BUN 27 09/03/2020 1612   CREATININE 3.73 (H) 01/24/2022 0817   CALCIUM 8.8 (L) 01/24/2022 0817   GFRNONAA 16 (L) 01/24/2022 0817   GFRAA 92 06/16/2020 0858    COAGS: Lab Results  Component Value Date   INR 1.1 11/23/2021   INR 1.2 09/28/2021   INR 1.2 01/16/2021     Statin:  Yes.   Beta Blocker:  Yes.   Aspirin:  No. ACEI:  No. ARB:  No. CCB use:  No Other  antiplatelets/anticoagulants:  No.    ASSESSMENT/PLAN: This is a 81 y.o. male with ESRD on HD MWF via left upper arm AV fistula. Fistula has been functioning well until today. Unable to complete HD session today with high venous pressures and tech was pulling lots of clots. Vascular surgery was consulted for evaluation of malfunctioning fistula and possible need for [facility] placement. Patient has already eaten today. Will schedule fistulogram tomorrow with Dr. Trula Slade. Will need to be NPO after midnight. He hopefully will be able to have HD after fistulogram tomorrow afternoon otherwise if needed we can place [facility]. Dr. Carlis Abbott will see patient later this after noon to provide further management plans  Marval [facility] [phone] 01/24/2022  10:54 AM  I have seen and evaluated the patient. I agree with the PA note as documented above.  81 year old male with end-stage renal disease using a left arm brachiocephalic AV fistula admitted after a fall.  Vascular surgery has been consulted for [facility] placement.  Patient states his fistula has been working great until today when it was infiltrated in dialysis.  It has an excellent thrill on exam although he does have a fair amount of ecchymosis from the infiltration.  Discussed plan for [facility] tomorrow with my partner Dr. Scot Dock at the request of nephrology.  Normally would pursue fistulogram but again the fistula has an outstanding thrill from the antecubitum all the way up to the axilla and he is otherwise had no issues with it.  TDC will allow the infiltration to resolve.  Discussed likely will be a thigh catheter given he has a c-collar for a C1 fracture.  Please keep NPO after midnight.    Marty Heck, MD [facility] [place] Office: [phone]

## 2022-01-24 NOTE — Progress Notes (Signed)
[facility] Progress Note   Subjective: Seen in HD. Crying out, says he "hurts all over". High venous pressures-pulling clots of AVF. Send to [facility] for F'gram, possible TDC placement. K+ 3.3.   Objective Vitals:   01/24/22 0000 01/24/22 0400 01/24/22 0741 01/24/22 0941  BP: 130/62 136/68 (!) 102/48 (!) 101/51  Pulse: 69 60 60 (!) 55  Resp:   16 16  Temp: 98.1 F (36.7 C) 97.9 F (36.6 C) 98 F (36.7 C) 98.2 F (36.8 C)  TempSrc: Oral Oral Oral Oral  SpO2: 98% 100% 100% 99%  Weight:      Height:       Physical Exam General: Very elderly male in mild distress-pain.  HEENT-bruising L eye, scalp laceration. Cervical collar in place.  Heart: S1.S2 RRR No M/R/G. SB on monitor.  Lungs:  CTAB A/P Abdomen: NABS, NT Extremities: NO LE edema Dialysis Access: AVF cannulated however high venous pressures, multiple sticks. Unable to continue HD.     Additional Objective Labs: Basic Metabolic Panel: Recent Labs  Lab 01/21/22 0027 01/21/22 0911 01/22/22 0458  NA 133* 134* 137  K 3.4* 4.0 3.3*  CL 96* 97* 97*  CO2 '23 27 27  '$ GLUCOSE 88 93 86  BUN 37* 36* 16  CREATININE 3.35* 3.32* 1.96*  CALCIUM 9.3 9.0 9.0   Liver Function Tests: Recent Labs  Lab 01/21/22 0027  AST 289*  ALT 107*  ALKPHOS 107  BILITOT 1.3*  PROT 5.6*  ALBUMIN 3.3*   No results for input(s): "LIPASE", "AMYLASE" in the last 168 hours. CBC: Recent Labs  Lab 01/21/22 0027 01/21/22 0911 01/22/22 0458  WBC 4.4 4.2 3.1*  NEUTROABS 2.8  --   --   HGB 8.8* 8.5* 8.6*  HCT 25.2* 25.9* 25.4*  MCV 103.3* 108.4* 105.4*  PLT 99* 99* 96*   Blood Culture    Component Value Date/Time   SDES URINE, CLEAN CATCH 12/03/2021 0651   SPECREQUEST  12/03/2021 0651    NONE Performed at [facility] [address] [place], [place] [id number]    CULT MULTIPLE SPECIES PRESENT, SUGGEST RECOLLECTION (A) 12/03/2021 0651   REPTSTATUS 12/04/2021 FINAL 12/03/2021 0651    Cardiac Enzymes: No  results for input(s): "CKTOTAL", "CKMB", "CKMBINDEX", "TROPONINI" in the last 168 hours. CBG: No results for input(s): "GLUCAP" in the last 168 hours. Iron Studies: No results for input(s): "IRON", "TIBC", "TRANSFERRIN", "FERRITIN" in the last 72 hours. '@lablastinr3'$ @ Studies/Results: No results found. Medications:  anticoagulant sodium citrate      amiodarone  200 mg Oral Daily   atorvastatin  20 mg Oral Daily   carvedilol  6.25 mg Oral BID   Chlorhexidine Gluconate Cloth  6 each Topical Q0600   darbepoetin (ARANESP) injection - DIALYSIS  100 mcg Intravenous Q Mon-HD   escitalopram  10 mg Oral Daily   folic acid  1 mg Oral Daily   multivitamin with minerals  1 tablet Oral Daily   potassium chloride  20 mEq Oral Once   tamsulosin  0.4 mg Oral QHS   thiamine  100 mg Oral Daily   Or   thiamine  100 mg Intravenous Daily   torsemide  60 mg Oral Daily   HD orders: [facility] MWF 4 hrs 180NRe 400/Autoflow 1.5 83.5 kg 2.0K/2.0 Ca AVF -No Heparin  -Mircera 50 mcg IV q 2 weeks (Last dose 01/10/2022) -Venofer 100 mg IV X 10 doses (Last dose 01/17/2022)   C1 fracture/scalp hematoma d/t fall at home: Management per  primary team and NSG. Lives alone with hx of recurrent falls. Needs SNF placement, patient agrees.  In process.   ESRD -  HD MWF. Continue on schedule. HD 9/18: 3K 4h 1-2L UF, no heparin  Hypertension/volume  - BP/volume acceptable. UF to EDW as able, will hope for standing weights next week  Anemia  - Hgb 8.6. Next ESA due 6/50  Metabolic bone disease -  Ca acceptable. No VDRA.   Nutrition - Renal diet with fluid restriction Issues with AVF-AVF appears infiltrated previously, issues with high venous pressures today. Needs TDC. Will call [facility].     Jimmye Norman. Effa Yarrow NP-C 01/24/2022, 10:05 AM  [employer] [phone]

## 2022-01-24 NOTE — Progress Notes (Signed)
PROGRESS NOTE    Darryl Diaz  FYB:[id number] DOB: [birth year] DOA: 01/21/2022 PCP: [name], MD   Brief Narrative:  Darryl Diaz is a 81 y.o. male with medical history significant of ESRD on HD MWF, chronic diastolic CHF, paroxysmal A-fib not on anticoagulation due to recurrent falls, tachy-brady syndrome, hypertension, hyperlipidemia, anxiety/depression, GERD, chronic pancytopenia, history of GI bleed, alcohol abuse, alcoholic liver cirrhosis, OSA not on CPAP, history of cardiac arrest in 2020. Patient lives alone at home and presented to the ED tonight via EMS after he had a fall while attempting to get in bed - notes he lost balance without LOC.  CT C-spine showing minimally displaced fracture of the C1 right superior articular process. CT head showing large left frontal scalp hematoma without skull fracture or any acute intracranial abnormality.  Remainder of trauma scans including x-ray of right shoulder and CT of lumbar and thoracic spine negative for acute traumatic injuries. Placed in [place] [facility] collar and neurosurgery consulted (Dr. Christella Noa) who recommends to remain in collar with outpatient follow up in clinic (no indication for intervention based on their evaluation).  Patient medically stable for discharge - awaiting safe disposition to SNF. (Initial plan was home with HHPT - he has plans to move into assisted living facility later this week but given unsafe disposition home even with home health will transition to SNF per recommendations). HD fistula dysfunction 9/18 during dialysis - vascular to evaluate while here awaiting placement.   Assessment & Plan:   Principal Problem:   C1 cervical fracture (HCC) Active Problems:   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HCC)   Paroxysmal atrial fibrillation (HCC)   Hypertension   Hyperlipidemia   Anemia, chronic disease   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HCC)   Chronic diastolic CHF (congestive heart failure)  (HCC)   Alcohol abuse   Minimally displaced fracture of the C1 right superior articular process -Secondary to mechanical fall, denies LOC; does drink occasionally -He is not on any antiplatelet agents or anticoagulation. -Continue [place] [facility] collar per neuroSx until office follow up *(reeducated on the 16th as he was found in bed with his collar off - " I took it off to eat lunch").   Recurrent falls, ambulatory dysfunction Complicated by alcohol use -Patient currently living alone at home  -PT/OT recommending SNF   ESRD on HD MWF Chronic diastolic CHF -HD per nephrology -Fistula evaluation given dysfunction - vascular following   Elevated transaminases Alcoholic liver cirrhosis Alcohol use disorder -Alcohol use daily - follow labs/symptoms -CIWA monitoring -Thiamine, folate, multivitamin ongoing   Chronic anemia -Hemoglobin 8.8, no significant change from baseline -Has a large forehead hematoma/laceration but no other obvious bleeding -Continue to monitor   Chronic thrombocytopenia -Platelet count 99k -Continue to monitor   Large left frontal scalp hematoma with forehead laceration -No skull fracture or acute intracranial abnormality on CT -Forehead laceration repaired per ED   QT prolongation -Cardiac monitoring -Check magnesium level -Avoid QT prolonging drugs -Repeat EKG in a.m.   Paroxysmal A-fib: Not on anticoagulation due to recurrent falls. 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Hypertension Hyperlipidemia -Continue home amiodarone, carvedilol, statin  Anxiety/depression -Continue home lexapro  BPH -Continue tamsulosin  GERD -Well controlled  DVT prophylaxis: None given bleeding as above Code Status: DNR Family Communication: None present  Status is: Inpt  Dispo: The patient is from: Home              Anticipated d/c is to: SNF  Anticipated d/c date is: Imminent              Patient currently medically stable for discharge  Consultants:  NeuroSx,  Nephro, Vascular Sx  Procedures:  None  Antimicrobials:  None   Subjective: No acute issue/events overnight, noted to have collar off yesterday per staff.  Reeducated on need to keep neck brace on at all times.  Objective: Vitals:   01/23/22 1536 01/23/22 1948 01/24/22 0000 01/24/22 0400  BP: (!) 141/59 (!) 145/68 130/62 136/68  Pulse: 65 60 69 60  Resp: 17     Temp: 97.7 F (36.5 C) 97.9 F (36.6 C) 98.1 F (36.7 C) 97.9 F (36.6 C)  TempSrc: Oral Oral Oral Oral  SpO2: 100% 100% 98% 100%  Weight:      Height:        Intake/Output Summary (Last 24 hours) at 01/24/2022 0724 Last data filed at 01/24/2022 0459 Gross per 24 hour  Intake --  Output 700 ml  Net -700 ml    Filed Weights   01/21/22 0033  Weight: 83 kg    Examination:  General exam: Appears calm and comfortable  Respiratory system: Clear to auscultation. Respiratory effort normal. Cardiovascular system: S1 & S2 heard, RRR. No JVD, murmurs, rubs, gallops or clicks. No pedal edema. Gastrointestinal system: Abdomen is nondistended, soft and nontender. No organomegaly or masses felt. Normal bowel sounds heard. Central nervous system: Alert and oriented. No focal neurological deficits. Extremities: Symmetric 5 x 5 power. Skin: Large forehead hematoma/laceration  Data Reviewed: I have personally reviewed following labs and imaging studies  CBC: Recent Labs  Lab 01/21/22 0027 01/21/22 0911 01/22/22 0458  WBC 4.4 4.2 3.1*  NEUTROABS 2.8  --   --   HGB 8.8* 8.5* 8.6*  HCT 25.2* 25.9* 25.4*  MCV 103.3* 108.4* 105.4*  PLT 99* 99* 96*    Basic Metabolic Panel: Recent Labs  Lab 01/21/22 0027 01/21/22 0911 01/22/22 0458  NA 133* 134* 137  K 3.4* 4.0 3.3*  CL 96* 97* 97*  CO2 '23 27 27  '$ GLUCOSE 88 93 86  BUN 37* 36* 16  CREATININE 3.35* 3.32* 1.96*  CALCIUM 9.3 9.0 9.0  MG  --  2.1  --     GFR: Estimated Creatinine Clearance: 33 mL/min (A) (by C-G formula based on SCr of 1.96 mg/dL  (H)). Liver Function Tests: Recent Labs  Lab 01/21/22 0027  AST 289*  ALT 107*  ALKPHOS 107  BILITOT 1.3*  PROT 5.6*  ALBUMIN 3.3*    No results for input(s): "LIPASE", "AMYLASE" in the last 168 hours. No results for input(s): "AMMONIA" in the last 168 hours. Coagulation Profile: No results for input(s): "INR", "PROTIME" in the last 168 hours. Cardiac Enzymes: No results for input(s): "CKTOTAL", "CKMB", "CKMBINDEX", "TROPONINI" in the last 168 hours. BNP (last 3 results) No results for input(s): "PROBNP" in the last 8760 hours. HbA1C: No results for input(s): "HGBA1C" in the last 72 hours. CBG: No results for input(s): "GLUCAP" in the last 168 hours. Lipid Profile: No results for input(s): "CHOL", "HDL", "LDLCALC", "TRIG", "CHOLHDL", "LDLDIRECT" in the last 72 hours. Thyroid Function Tests: No results for input(s): "TSH", "T4TOTAL", "FREET4", "T3FREE", "THYROIDAB" in the last 72 hours. Anemia Panel: No results for input(s): "VITAMINB12", "FOLATE", "FERRITIN", "TIBC", "IRON", "RETICCTPCT" in the last 72 hours. Sepsis Labs: No results for input(s): "PROCALCITON", "LATICACIDVEN" in the last 168 hours.  No results found for this or any previous visit (from the past 240 hour(s)).  Radiology Studies: No results found.  Scheduled Meds:  amiodarone  200 mg Oral Daily   atorvastatin  20 mg Oral Daily   carvedilol  6.25 mg Oral BID   Chlorhexidine Gluconate Cloth  6 each Topical Q0600   darbepoetin (ARANESP) injection - DIALYSIS  100 mcg Intravenous Q Mon-HD   escitalopram  10 mg Oral Daily   folic acid  1 mg Oral Daily   multivitamin with minerals  1 tablet Oral Daily   potassium chloride  20 mEq Oral Once   tamsulosin  0.4 mg Oral QHS   thiamine  100 mg Oral Daily   Or   thiamine  100 mg Intravenous Daily   torsemide  60 mg Oral Daily    LOS: 2 days   Time spent: 58mn  Jalan Fariss C Davanee Klinkner, DO [facility]  If 7PM-7AM, please contact  night-coverage [email or web address]  01/24/2022, 7:24 AM

## 2022-01-24 NOTE — H&P (View-Only) (Signed)
Hospital Consult    Reason for Consult:  fistula malfunctioning Requesting Physician:  Juanell Fairly NP MRN #:  [id number]  History of Present Illness: This is a very pleasant 81 y.o. male with PMH significant for ESRD on HD MWF via Left upper extremity AVF, CHF, PAF, alcohol use disorder, HTN, HLD, GERD who presented after fall at home.He has had multiple falls recently. On presentation he was found to have large scalp hematoma and minimally displaced C1 fracture. His last dialysis session was 9/11 on presentation as he had missed his 9/13 session due to transportation issues. He dialyzed as an inpatient on 9/15 with no reported issues. He explains that his fistula has been working well. He has had no pain or bleeding issues. No short treatment sessions or issues with fistula not functioning. However today at his morning session he was noted to have high venous pressures and tech pulled lots of clots. They were unable to continue his treatment. Vascular surgery was therefore consulted for evaluation.   Past Medical History:  Diagnosis Date   Alcohol abuse 29/92/4268   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[place]) 03/17/2020   Alcoholism ([place])    in remission following wife's death   Anemia    Anxiety    Ascending aorta dilation (HCC)    Atrial fibrillation (HCC)    Cardiac arrest ([place]) 04/20/2019   12 min CPR with epi   Chronic diastolic CHF (congestive heart failure) ([place])    Dysrhythmia    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[place])    Mon Wed Fri   Fatty liver    Fracture of hip ([place]) 03/30/2016   Gastritis    GERD (gastroesophageal reflux disease)    H/O seasonal allergies    Heme positive stool 05/14/2020   History of blood transfusion    History of GI bleed    Hx of adenomatous colonic polyps 08/10/2017   Hyperlipidemia    Hypertension    Insomnia    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   Mallory-Weiss tear    OA (osteoarthritis)    hands   OSA (obstructive  sleep apnea)    No longer uses CPAP   Persistent atrial fibrillation with rapid ventricular response ([place]) 02/05/2020   Posterior dislocation of hip, closed ([place]) 04/30/2016   Recurrent syncope    Restless legs syndrome (RLS)    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([place])     Past Surgical History:  Procedure Laterality Date   AMPUTATION Left 05/19/2021   Procedure: LEFT GREAT TOE AMPUTATION;  Surgeon: Newt Minion, MD;  Location: [facility];  Service: Orthopedics;  Laterality: Left;   ANKLE SURGERY Left 2014   had rods put in    AV FISTULA PLACEMENT Left 04/21/2021   Procedure: LEFT ARM ARTERIOVENOUS (AV) FISTULA CREATION - Left brachiocephalic fistula;  Surgeon: Marty Heck, MD;  Location: [facility];  Service: Vascular;  Laterality: Left;   BIOPSY  02/28/2019   Procedure: BIOPSY;  Surgeon: Milus Banister, MD;  Location: [facility];  Service: Endoscopy;;   CARDIOVERSION  02/06/2020   CARDIOVERSION N/A 02/06/2020   Procedure: CARDIOVERSION;  Surgeon: Werner Lean, MD;  Location: [facility] ENDOSCOPY;  Service: Cardiovascular;  Laterality: N/A;   CARDIOVERSION N/A 02/24/2020   Procedure: CARDIOVERSION;  Surgeon: Werner Lean, MD;  Location: [facility] ENDOSCOPY;  Service: Cardiovascular;  Laterality: N/A;   COLONOSCOPY     [place]   COLONOSCOPY WITH PROPOFOL N/A 01/08/2021   Procedure: COLONOSCOPY WITH PROPOFOL;  Surgeon: Milus Banister, MD;  Location: [facility] ENDOSCOPY;  Service: Endoscopy;  Laterality: N/A;   ESOPHAGOGASTRODUODENOSCOPY (EGD) WITH PROPOFOL N/A 02/28/2019   Procedure: ESOPHAGOGASTRODUODENOSCOPY (EGD) WITH PROPOFOL;  Surgeon: Milus Banister, MD;  Location[facility] END[facility];  Service: Endoscopy;  Laterality: N/A;   FRACTURE SURGERY     HIP ARTHROPLASTY Left 04/01/2016   Procedure: ARTHROPLASTY BIPOLAR HIP (HEMIARTHROPLASTY);  Surgeon: Paralee Cancel, MD;  Location: [facility];  Service: Orthopedics;  Laterality: Left;   HIP CLOSED REDUCTION Left 04/30/2016   Procedure: CLOSED  REDUCTION HIP;  Surgeon: Wylene Simmer, MD;  Location: [facility];  Service: Orthopedics;  Laterality: Left;   HOT HEMOSTASIS N/A 01/08/2021   Procedure: HOT HEMOSTASIS (ARGON PLASMA COAGULATION/BICAP);  Surgeon: Milus Banister, MD;  [facility];  Service: Endoscopy;  Laterality: N/A;   IR RADIOLOGIST EVAL & MGMT  12/31/2020   JOINT REPLACEMENT     TONSILLECTOMY     TOTAL HIP ARTHROPLASTY Right 10/15/2019   Procedure: TOTAL HIP ARTHROPLASTY ANTERIOR APPROACH;  Surgeon: Paralee Cancel, MD;  Location: [facility];  Service: Orthopedics;  Laterality: Right;  70 mins   VASCULAR SURGERY      Allergies  Allergen Reactions   Penicillin G Sodium Rash and Other (See Comments)     (tolerates Keflex)   Penicillins Rash    Prior to Admission medications   Medication Sig Start Date End Date Taking? Authorizing Provider  amiodarone (PACERONE) 200 MG tablet TAKE 1 TABLET BY MOUTH ONCE DAILY Patient taking differently: Take 200 mg by mouth every morning. 05/17/21  Yes Weaver, Scott T, PA-C  atorvastatin (LIPITOR) 20 MG tablet Take 1 tablet (20 mg total) by mouth daily. 03/10/20  Yes Angiulli, Lavon Paganini, PA-C  carvedilol (COREG) 6.25 MG tablet Take 1 tablet (6.25 mg total) by mouth 2 (two) times daily. Patient taking differently: Take 12.5 mg by mouth daily. 02/11/21  Yes Sherren Mocha, MD  cetirizine (ZYRTEC) 10 MG tablet Take 10 mg by mouth daily.   Yes [provider]  escitalopram (LEXAPRO) 10 MG tablet Take 1 tablet (10 mg total) by mouth daily. Patient taking differently: Take 10 mg by mouth every morning. 03/10/20  Yes Angiulli, Lavon Paganini, PA-C  ferrous sulfate 324 MG TBEC Take 324 mg by mouth every morning.   Yes [provider]  FOLIC ACID PO Take 1 tablet by mouth every morning.   Yes [provider]  Magnesium Oxide 400 MG CAPS Take 1 capsule (400 mg total) by mouth daily. 10/17/20  Yes Eugenie Filler, MD  Multiple Vitamin (MULTIVITAMIN PO) Take 1 tablet by mouth  every other day.   Yes [provider]  nitroGLYCERIN (NITROSTAT) 0.4 MG SL tablet Place 1 tablet (0.4 mg total) under the tongue every 5 (five) minutes x 3 doses as needed for chest pain. 07/28/20  Yes Baldwin Jamaica, PA-C  pantoprazole (PROTONIX) 40 MG tablet Take 1 tablet (40 mg total) by mouth daily. Patient taking differently: Take 40 mg by mouth every morning. 03/10/20  Yes Angiulli, Lavon Paganini, PA-C  sodium bicarbonate 650 MG tablet Take 650 mg by mouth daily.   Yes [provider]  tamsulosin (FLOMAX) 0.4 MG CAPS capsule Take 0.4 mg by mouth at bedtime.   Yes [provider]  torsemide (DEMADEX) 20 MG tablet Take 60 mg by mouth daily.   Yes [provider]  traZODone (DESYREL) 50 MG tablet Take 1 tablet (50 mg total) by mouth at bedtime. 11/26/21  Yes Lesia Sago  J, MD    Social History   Socioeconomic History   Marital status: Widowed    Spouse name: Not on file   Number of children: 2   Years of education: college   Highest education level: Not on file  Occupational History   Occupation: retired  Tobacco Use   Smoking status: Former    Packs/day: 1.00    Years: 20.00    Total pack years: 20.00    Types: Cigarettes   Smokeless tobacco: Never   Tobacco comments:       quit 20 years  smoked on and off for 20 years  Vaping Use   Vaping Use: Never used  Substance and Sexual Activity   Alcohol use: Not Currently    Alcohol/week: 3.0 standard drinks of alcohol    Types: 3 Glasses of wine per week    Comment: h/o heavy use   Drug use: Never   Sexual activity: Not Currently  Other Topics Concern   Not on file  Social History Narrative   ** Merged History Encounter **       The patient is divorced w/ 1 son 1 daughter He is a retired Regulatory affairs officer, he lived in [place] but [employer] including windows on the world in the [place] room in [place] He moved to [place] to be near his sister who liv   es in  [place] alcoholic member of AA 2 caffeinated beverages daily prior smoker no drugs   Social Determinants of Radio broadcast assistant Strain: Low Risk  (10/23/2020)   Overall Financial Resource Strain (CARDIA)    Difficulty of Paying Living Expenses: Not hard at all  Food Insecurity: No Food Insecurity (10/23/2020)   Hunger Vital Sign    Worried About Running Out of Food in the Last Year: Never true    [place] in the Last Year: Never true  Transportation Needs: No Transportation Needs (10/23/2020)   PRAPARE - Hydrologist (Medical): No    Lack of Transportation (Non-Medical): No  Physical Activity: Not on file  Stress: No Stress Concern Present (10/23/2020)   [place]    Feeling of Stress : Not at all  Social Connections: Moderately Integrated (10/23/2020)   Social Connection and Isolation Panel [NHANES]    Frequency of Communication with Friends and Family: More than three times a week    Frequency of Social Gatherings with Friends and Family: More than three times a week    Attends Religious Services: 1 to 4 times per year    Active Member of [employer] or Organizations: No    Attends Archivist Meetings: 1 to 4 times per year    Marital Status: Widowed  Intimate Partner Violence: Not At Risk (10/23/2020)   Humiliation, Afraid, Rape, and Kick questionnaire    Fear of Current or Ex-Partner: No    Emotionally Abused: No    Physically Abused: No    Sexually Abused: No     Family History  Problem Relation Age of Onset   Heart disease Mother 16   Hypertension Mother    Arthritis Mother    Heart failure Father 18   Stroke Maternal Aunt    Heart failure Sister    Colon cancer Neg Hx    Esophageal cancer Neg Hx    Stomach cancer Neg Hx    Rectal cancer Neg Hx  ROS: Otherwise negative unless mentioned in HPI  Physical Examination  Vitals:    01/24/22 0741 01/24/22 0941  BP: (!) 102/48 (!) 101/51  Pulse: 60 (!) 55  Resp: 16 16  Temp: 98 F (36.7 C) 98.2 F (36.8 C)  SpO2: 100% 99%   Body mass index is 24.82 kg/m.  General:  WDWN in NAD; in c- collar Gait: Not observed HENT: WNL, normocephalic Pulmonary: normal non-labored breathing Cardiac: regular Vascular Exam/Pulses: left AV fistula with good thrill, some pulsatility. Overlying ecchymosis. Just came off of HD so dressings in place. No active bleeding. 2+ left radial pulse. Left hand warm and well perfused Musculoskeletal: no muscle wasting or atrophy  Neurologic: A&O X 3;  No focal weakness or paresthesias are detected; speech is fluent/normal Psychiatric:  The pt has Normal affect.  CBC    Component Value Date/Time   WBC 3.1 (L) 01/22/2022 0458   RBC 2.41 (L) 01/22/2022 0458   HGB 8.6 (L) 01/22/2022 0458   HGB 10.2 (L) 06/03/2020 1217   HCT 25.4 (L) 01/22/2022 0458   HCT 31.0 (L) 06/03/2020 1217   PLT 96 (L) 01/22/2022 0458   PLT 163 06/03/2020 1217   MCV 105.4 (H) 01/22/2022 0458   MCV 93 06/03/2020 1217   MCH 35.7 (H) 01/22/2022 0458   MCHC 33.9 01/22/2022 0458   RDW 15.6 (H) 01/22/2022 0458   RDW 14.6 06/03/2020 1217   LYMPHSABS 0.9 01/21/2022 0027   MONOABS 0.6 01/21/2022 0027   EOSABS 0.0 01/21/2022 0027   BASOSABS 0.0 01/21/2022 0027    BMET    Component Value Date/Time   NA 136 01/24/2022 0817   NA 133 (L) 09/03/2020 1612   K 3.4 (L) 01/24/2022 0817   CL 100 01/24/2022 0817   CO2 26 01/24/2022 0817   GLUCOSE 166 (H) 01/24/2022 0817   BUN 47 (H) 01/24/2022 0817   BUN 27 09/03/2020 1612   CREATININE 3.73 (H) 01/24/2022 0817   CALCIUM 8.8 (L) 01/24/2022 0817   GFRNONAA 16 (L) 01/24/2022 0817   GFRAA 92 06/16/2020 0858    COAGS: Lab Results  Component Value Date   INR 1.1 11/23/2021   INR 1.2 09/28/2021   INR 1.2 01/16/2021     Statin:  Yes.   Beta Blocker:  Yes.   Aspirin:  No. ACEI:  No. ARB:  No. CCB use:  No Other  antiplatelets/anticoagulants:  No.    ASSESSMENT/PLAN: This is a 81 y.o. male with ESRD on HD MWF via left upper arm AV fistula. Fistula has been functioning well until today. Unable to complete HD session today with high venous pressures and tech was pulling lots of clots. Vascular surgery was consulted for evaluation of malfunctioning fistula and possible need for [facility] placement. Patient has already eaten today. Will schedule fistulogram tomorrow with Dr. Trula Slade. Will need to be NPO after midnight. He hopefully will be able to have HD after fistulogram tomorrow afternoon otherwise if needed we can place [facility]. Dr. Carlis Abbott will see patient later this after noon to provide further management plans  Marval Regal [facility] [phone] 01/24/2022  10:54 AM  I have seen and evaluated the patient. I agree with the PA note as documented above.  81 year old male with end-stage renal disease using a left arm brachiocephalic AV fistula admitted after a fall.  Vascular surgery has been consulted for [facility] placement.  Patient states his fistula has been working great until today when it was infiltrated in dialysis.  It has an excellent thrill on exam although he does have a fair amount of ecchymosis from the infiltration.  Discussed plan for [facility] tomorrow with my partner Dr. Scot Dock at the request of nephrology.  Normally would pursue fistulogram but again the fistula has an outstanding thrill from the antecubitum all the way up to the axilla and he is otherwise had no issues with it.  TDC will allow the infiltration to resolve.  Discussed likely will be a thigh catheter given he has a c-collar for a C1 fracture.  Please keep NPO after midnight.    Marty Heck, MD [facility] [place] Office: [phone]

## 2022-01-24 NOTE — Progress Notes (Signed)
Physical Therapy Treatment Patient Details Name: Darryl Diaz MRN: [id number] DOB: 1940/12/13 Today's Date: 01/24/2022   History of Present Illness Pt is an 81 y/o M presenting to ED on 9/15 after fall attempting to get OOB, CT spine revealing C17f and L frontal scalp hematoma. PMH includes alcohol abuse, ESRD on HD MWF, CHF, HTN, and HLD.    PT Comments    Patient acknowledging that he needs to go for [facility] for rehab to improve his safety and reduce his fall risk. Currently his fall risk is high with abnormal gait pattern resulting in LLE kicking left back leg of RW multiple times while walking. Will benefit from continued PT for balance and gait training.     Recommendations for follow up therapy are one component of a multi-disciplinary discharge planning process, led by the attending physician.  Recommendations may be updated based on patient status, additional functional criteria and insurance authorization.  Follow Up Recommendations  Skilled nursing-short term rehab (<3 hours/day) Can patient physically be transported by private vehicle: Yes   Assistance Recommended at Discharge Intermittent Supervision/Assistance  Patient can return home with the following A little help with walking and/or transfers;A little help with bathing/dressing/bathroom;Assist for transportation;Help with stairs or ramp for entrance;Assistance with cooking/housework   Equipment Recommendations  None recommended by PT    Recommendations for Other Services       Precautions / Restrictions Precautions Precautions: Fall Required Braces or Orthoses: Cervical Brace Cervical Brace: Hard collar;At all times Restrictions Weight Bearing Restrictions: No     Mobility  Bed Mobility Overal bed mobility: Needs Assistance Bed Mobility: Supine to Sit   Sidelying to sit: Min guard       General bed mobility comments: required cues for rolling prior to sitting up (neck precautions); near need for assist  to raise his torso from sidelying    Transfers Overall transfer level: Needs assistance Equipment used: Rolling walker (2 wheels) Transfers: Sit to/from Stand Sit to Stand: Min guard           General transfer comment: cues for hand placement and safe technique. No assist today    Ambulation/Gait Ambulation/Gait assistance: Min assist Gait Distance (Feet): 80 Feet (seated rest; 50 ft) Assistive device: Rolling walker (2 wheels) Gait Pattern/deviations: Step-through pattern, Wide base of support, Decreased dorsiflexion - left   Gait velocity interpretation: 1.31 - 2.62 ft/sec, indicative of limited community ambulator   General Gait Details: wide BOS with left foot occasionally kicking back left leg of RW; noted steppage gait on LLE and foot flat on RLE; pt stating he thinks he is better with his rollator (previous PT tried both and felt pt more steady with RW, not rollator)   Stairs             Wheelchair Mobility    Modified Rankin (Stroke Patients Only)       Balance Overall balance assessment: Needs assistance Sitting-balance support: Feet supported Sitting balance-Leahy Scale: Fair Sitting balance - Comments: can reach minimally outside BOS without LOB   Standing balance support: Reliant on assistive device for balance, During functional activity Standing balance-Leahy Scale: Poor Standing balance comment: reliant on external support and/or AD.  Rollator may not give enough support                            Cognition Arousal/Alertness: Awake/alert Behavior During Therapy: WFL for tasks assessed/performed Overall Cognitive Status: No family/caregiver present to determine baseline cognitive  functioning Area of Impairment: Attention, Memory, Following commands, Awareness, Safety/judgement, Problem solving                 Orientation Level:  (NT) Current Attention Level: Sustained Memory: Decreased short-term memory Following Commands:  Follows one step commands with increased time Safety/Judgement: Decreased awareness of deficits, Decreased awareness of safety Awareness: Emergent Problem Solving: Slow processing, Requires verbal cues, Requires tactile cues General Comments: pt with decreased safety awareness, especially related to safety during gait        Exercises Other Exercises Other Exercises: sit to stand x 10 reps with emphasis on proper sequencing with RW    General Comments        Pertinent Vitals/Pain Pain Assessment Pain Assessment: No/denies pain    Home Living                          Prior Function            PT Goals (current goals can now be found in the care plan section) Acute Rehab PT Goals Patient Stated Goal: get to ALF Time For Goal Achievement: 02/05/22 Potential to Achieve Goals: Good Progress towards PT goals: Progressing toward goals    Frequency    Min 3X/week      PT Plan Current plan remains appropriate    Co-evaluation              AM-PAC PT "6 Clicks" Mobility   Outcome Measure  Help needed turning from your back to your side while in a flat bed without using bedrails?: A Little Help needed moving from lying on your back to sitting on the side of a flat bed without using bedrails?: A Little Help needed moving to and from a bed to a chair (including a wheelchair)?: A Little Help needed standing up from a chair using your arms (e.g., wheelchair or bedside chair)?: A Little Help needed to walk in hospital room?: A Little Help needed climbing 3-5 steps with a railing? : A Lot 6 Click Score: 17    End of Session Equipment Utilized During Treatment: Gait belt Activity Tolerance: Patient tolerated treatment well Patient left: with call bell/phone within reach;in chair;with chair alarm set Nurse Communication: Mobility status PT Visit Diagnosis: Other abnormalities of gait and mobility (R26.89);Muscle weakness (generalized) (M62.81);Difficulty in  walking, not elsewhere classified (R26.2)     Time: 0626-9485 PT Time Calculation (min) (ACUTE ONLY): 22 min  Charges:  $Gait Training: 8-22 mins                      Arby Barrette, PT [place]  Office [phone]    Rexanne Mano 01/24/2022, 3:20 PM

## 2022-01-24 NOTE — Progress Notes (Addendum)
Patient with high venous pressure; AVF pulling clots. tx terminated. Juanell Fairly NP at bedside and made aware. Handoff given to bedside RN. Pt transported back to unit.

## 2022-01-25 ENCOUNTER — Encounter (HOSPITAL_COMMUNITY)
Admission: EM | Disposition: A | Discharge status: Skilled Nursing Facility | Payer: Self-pay | Source: Home / Self Care | Attending: Internal Medicine

## 2022-01-25 ENCOUNTER — Encounter (HOSPITAL_COMMUNITY): Payer: Self-pay | Admitting: Internal Medicine

## 2022-01-25 ENCOUNTER — Inpatient Hospital Stay (HOSPITAL_COMMUNITY): Payer: Medicare Other | Admitting: Certified Registered Nurse Anesthetist

## 2022-01-25 ENCOUNTER — Inpatient Hospital Stay (HOSPITAL_COMMUNITY): Payer: Medicare Other

## 2022-01-25 DIAGNOSIS — I509 Heart failure, unspecified: Secondary | ICD-10-CM | POA: Diagnosis not present

## 2022-01-25 DIAGNOSIS — Z992 Dependence on renal dialysis: Secondary | ICD-10-CM

## 2022-01-25 DIAGNOSIS — N186 End stage renal disease: Secondary | ICD-10-CM

## 2022-01-25 DIAGNOSIS — S12001A Unspecified nondisplaced fracture of first cervical vertebra, initial encounter for closed fracture: Secondary | ICD-10-CM

## 2022-01-25 DIAGNOSIS — Z87891 Personal history of nicotine dependence: Secondary | ICD-10-CM

## 2022-01-25 DIAGNOSIS — S12000A Unspecified displaced fracture of first cervical vertebra, initial encounter for closed fracture: Secondary | ICD-10-CM | POA: Diagnosis not present

## 2022-01-25 DIAGNOSIS — N185 Chronic kidney disease, stage 5: Secondary | ICD-10-CM | POA: Diagnosis not present

## 2022-01-25 DIAGNOSIS — I132 Hypertensive heart and chronic kidney disease with heart failure and with stage 5 chronic kidney disease, or end stage renal disease: Secondary | ICD-10-CM

## 2022-01-25 HISTORY — PX: INSERTION OF DIALYSIS CATHETER: SHX1324

## 2022-01-25 LAB — POCT I-STAT, CHEM 8
BUN: 54 mg/dL — ABNORMAL HIGH (ref 8–23)
Calcium, Ion: 1.09 mmol/L — ABNORMAL LOW (ref 1.15–1.40)
Chloride: 100 mmol/L (ref 98–111)
Creatinine, Ser: 4.1 mg/dL — ABNORMAL HIGH (ref 0.61–1.24)
Glucose, Bld: 96 mg/dL (ref 70–99)
HCT: 26 % — ABNORMAL LOW (ref 39.0–52.0)
Hemoglobin: 8.8 g/dL — ABNORMAL LOW (ref 13.0–17.0)
Potassium: 3.5 mmol/L (ref 3.5–5.1)
Sodium: 135 mmol/L (ref 135–145)
TCO2: 26 mmol/L (ref 22–32)

## 2022-01-25 SURGERY — INSERTION OF DIALYSIS CATHETER
Anesthesia: Monitor Anesthesia Care

## 2022-01-25 MED ORDER — 0.9 % SODIUM CHLORIDE (POUR BTL) OPTIME
TOPICAL | Status: DC | PRN
  Administered 2022-01-25: 1000 mL

## 2022-01-25 MED ORDER — EPHEDRINE SULFATE-NACL 50-0.9 MG/10ML-% IV SOSY
PREFILLED_SYRINGE | INTRAVENOUS | Status: DC | PRN
  Administered 2022-01-25: 10 mg via INTRAVENOUS

## 2022-01-25 MED ORDER — LIDOCAINE-EPINEPHRINE (PF) 1 %-1:200000 IJ SOLN
INTRAMUSCULAR | Status: DC | PRN
  Administered 2022-01-25: 2 mL

## 2022-01-25 MED ORDER — EPHEDRINE 5 MG/ML INJ
INTRAVENOUS | Status: AC
  Filled 2022-01-25: qty 10

## 2022-01-25 MED ORDER — HEPARIN SODIUM (PORCINE) 1000 UNIT/ML IJ SOLN
INTRAMUSCULAR | Status: DC | PRN
  Administered 2022-01-25: 6200 [IU] via INTRAVENOUS

## 2022-01-25 MED ORDER — HEPARIN 6000 UNIT IRRIGATION SOLUTION
Status: AC
  Filled 2022-01-25: qty 500

## 2022-01-25 MED ORDER — VANCOMYCIN HCL IN DEXTROSE 1-5 GM/200ML-% IV SOLN: 1000.0000 mg | Freq: Once | INTRAVENOUS | Status: AC

## 2022-01-25 MED ORDER — SODIUM CHLORIDE 0.9 % IV SOLN: INTRAVENOUS | Status: DC

## 2022-01-25 MED ORDER — PHENYLEPHRINE 80 MCG/ML (10ML) SYRINGE FOR IV PUSH (FOR BLOOD PRESSURE SUPPORT)
PREFILLED_SYRINGE | INTRAVENOUS | Status: AC
  Filled 2022-01-25: qty 20

## 2022-01-25 MED ORDER — ROCURONIUM BROMIDE 10 MG/ML (PF) SYRINGE
PREFILLED_SYRINGE | INTRAVENOUS | Status: AC
  Filled 2022-01-25: qty 10

## 2022-01-25 MED ORDER — CHLORHEXIDINE GLUCONATE CLOTH 2 % EX PADS
6.0000 | MEDICATED_PAD | Freq: Every day | CUTANEOUS | Status: DC
  Administered 2022-01-25 – 2022-01-27 (×3): 6 via TOPICAL

## 2022-01-25 MED ORDER — ATROPINE SULFATE 0.4 MG/ML IV SOLN
INTRAVENOUS | Status: AC
  Filled 2022-01-25: qty 1

## 2022-01-25 MED ORDER — CHLORHEXIDINE GLUCONATE 0.12 % MT SOLN
15.0000 mL | Freq: Once | OROMUCOSAL | Status: AC
  Administered 2022-01-25: 15 mL via OROMUCOSAL

## 2022-01-25 MED ORDER — HEPARIN 6000 UNIT IRRIGATION SOLUTION
Status: DC | PRN
  Administered 2022-01-25: 1

## 2022-01-25 MED ORDER — LIDOCAINE-EPINEPHRINE (PF) 1 %-1:200000 IJ SOLN
INTRAMUSCULAR | Status: AC
  Filled 2022-01-25: qty 30

## 2022-01-25 MED ORDER — VANCOMYCIN HCL IN DEXTROSE 1-5 GM/200ML-% IV SOLN
INTRAVENOUS | Status: AC
  Administered 2022-01-25: 1000 mg via INTRAVENOUS
  Filled 2022-01-25: qty 200

## 2022-01-25 MED ORDER — IODIXANOL 320 MG/ML IV SOLN
INTRAVENOUS | Status: DC | PRN
  Administered 2022-01-25: 42 mL

## 2022-01-25 MED ORDER — PROPOFOL 10 MG/ML IV BOLUS
INTRAVENOUS | Status: DC | PRN
  Administered 2022-01-25: 20 mg via INTRAVENOUS

## 2022-01-25 MED ORDER — PROPOFOL 500 MG/50ML IV EMUL
INTRAVENOUS | Status: DC | PRN
  Administered 2022-01-25: 75 ug/kg/min via INTRAVENOUS

## 2022-01-25 MED ORDER — CHLORHEXIDINE GLUCONATE 0.12 % MT SOLN
OROMUCOSAL | Status: AC
  Filled 2022-01-25: qty 15

## 2022-01-25 MED ORDER — HEPARIN SODIUM (PORCINE) 1000 UNIT/ML IJ SOLN
INTRAMUSCULAR | Status: AC
  Filled 2022-01-25: qty 10

## 2022-01-25 MED ORDER — PHENYLEPHRINE 80 MCG/ML (10ML) SYRINGE FOR IV PUSH (FOR BLOOD PRESSURE SUPPORT)
PREFILLED_SYRINGE | INTRAVENOUS | Status: DC | PRN
  Administered 2022-01-25: 80 ug via INTRAVENOUS
  Administered 2022-01-25 (×2): 160 ug via INTRAVENOUS
  Administered 2022-01-25: 80 ug via INTRAVENOUS
  Administered 2022-01-25 (×2): 160 ug via INTRAVENOUS

## 2022-01-25 MED ORDER — SUCCINYLCHOLINE CHLORIDE 200 MG/10ML IV SOSY
PREFILLED_SYRINGE | INTRAVENOUS | Status: AC
  Filled 2022-01-25: qty 10

## 2022-01-25 MED ORDER — ORAL CARE MOUTH RINSE: 15.0000 mL | Freq: Once | OROMUCOSAL | Status: AC

## 2022-01-25 SURGICAL SUPPLY — 42 items
BAG COUNTER SPONGE SURGICOUNT (BAG) ×1 IMPLANT
BAG DECANTER FOR FLEXI CONT (MISCELLANEOUS) ×1 IMPLANT
BIOPATCH RED 1 DISK 7.0 (GAUZE/BANDAGES/DRESSINGS) ×1 IMPLANT
CATH PALINDROME-P 19CM W/VT (CATHETERS) IMPLANT
CATH PALINDROME-P 23CM W/VT (CATHETERS) IMPLANT
CATH PALINDROME-P 28CM W/VT (CATHETERS) IMPLANT
CHLORAPREP W/TINT 26 (MISCELLANEOUS) ×1 IMPLANT
COVER PROBE W GEL 5X96 (DRAPES) IMPLANT
COVER SURGICAL LIGHT HANDLE (MISCELLANEOUS) ×1 IMPLANT
DERMABOND ADVANCED .7 DNX12 (GAUZE/BANDAGES/DRESSINGS) IMPLANT
DRAPE C-ARM 42X72 X-RAY (DRAPES) ×1 IMPLANT
DRAPE CHEST BREAST 15X10 FENES (DRAPES) ×1 IMPLANT
DRSG COVADERM 4X6 (GAUZE/BANDAGES/DRESSINGS) IMPLANT
GAUZE 4X4 16PLY ~~LOC~~+RFID DBL (SPONGE) ×1 IMPLANT
GLOVE BIO SURGEON STRL SZ7.5 (GLOVE) ×1 IMPLANT
GLOVE BIOGEL PI IND STRL 8 (GLOVE) ×1 IMPLANT
GLOVE SURG POLY ORTHO LF SZ7.5 (GLOVE) IMPLANT
GLOVE SURG UNDER LTX SZ8 (GLOVE) ×1 IMPLANT
GOWN STRL REUS W/ TWL LRG LVL3 (GOWN DISPOSABLE) ×2 IMPLANT
GOWN STRL REUS W/TWL LRG LVL3 (GOWN DISPOSABLE) ×2
GUIDEWIRE ANGLED .035X150CM (WIRE) IMPLANT
KIT BASIN OR (CUSTOM PROCEDURE TRAY) ×1 IMPLANT
KIT PALINDROME-P 55CM (CATHETERS) IMPLANT
KIT TURNOVER KIT B (KITS) ×1 IMPLANT
NDL 18GX1X1/2 (RX/OR ONLY) (NEEDLE) ×1 IMPLANT
NDL HYPO 25GX1X1/2 BEV (NEEDLE) ×1 IMPLANT
NEEDLE 18GX1X1/2 (RX/OR ONLY) (NEEDLE) ×1 IMPLANT
NEEDLE HYPO 25GX1X1/2 BEV (NEEDLE) ×1 IMPLANT
NS IRRIG 1000ML POUR BTL (IV SOLUTION) ×1 IMPLANT
PACK BASIC III (CUSTOM PROCEDURE TRAY) ×1
PACK SRG BSC III STRL LF ECLPS (CUSTOM PROCEDURE TRAY) ×1 IMPLANT
PAD ARMBOARD 7.5X6 YLW CONV (MISCELLANEOUS) ×2 IMPLANT
SET MICROPUNCTURE 5F STIFF (MISCELLANEOUS) IMPLANT
SUT ETHILON 3 0 PS 1 (SUTURE) ×1 IMPLANT
SUT MNCRL AB 4-0 PS2 18 (SUTURE) ×1 IMPLANT
SYR 10ML LL (SYRINGE) ×1 IMPLANT
SYR 20ML LL LF (SYRINGE) ×2 IMPLANT
SYR 5ML LL (SYRINGE) ×2 IMPLANT
SYR CONTROL 10ML LL (SYRINGE) ×1 IMPLANT
TOWEL GREEN STERILE (TOWEL DISPOSABLE) ×2 IMPLANT
TOWEL GREEN STERILE FF (TOWEL DISPOSABLE) ×1 IMPLANT
WATER STERILE IRR 1000ML POUR (IV SOLUTION) ×1 IMPLANT

## 2022-01-25 NOTE — Progress Notes (Signed)
Occupational Therapy Treatment Patient Details Name: Darryl Diaz MRN: [id number] DOB: [birth year] Today's Date: 01/25/2022   History of present illness Pt is an 81 y/o M presenting to ED on 9/15 after fall attempting to get OOB, CT spine revealing C61f and L frontal scalp hematoma. PMH includes alcohol abuse, ESRD on HD MWF, CHF, HTN, and HLD.   OT comments  Pt progressing towards established OT goals. Session limited by low BP, but patient overall motivated to learn and participate in therapy. Pt recalling 1 fall prevention strategy without cues, and able to respond appropriately to two additional fall prevention strategies given 2 choices. Pt with skill to recall ~2 strategies taught in previous session regarding RW use. Pt performing sit<>Stand and stand pivot with min A for steadying after rise. Min cues throughout foe sequencing and safety during transfers. Pt with frequent apologizing that he could not "go further", requiring education regarding priority of safety. Continue to recommend discharge at SNF for continued OT services.    Recommendations for follow up therapy are one component of a multi-disciplinary discharge planning process, led by the attending physician.  Recommendations may be updated based on patient status, additional functional criteria and insurance authorization.    Follow Up Recommendations  Skilled nursing-short term rehab (<3 hours/day)    Assistance Recommended at Discharge Frequent or constant Supervision/Assistance  Patient can return home with the following  A little help with walking and/or transfers;A lot of help with bathing/dressing/bathroom;Assistance with cooking/housework;Direct supervision/assist for medications management;Direct supervision/assist for financial management;Assist for transportation;Help with stairs or ramp for entrance   Equipment Recommendations  Other (comment) (defer to next venue)    Recommendations for Other Services PT  consult    Precautions / Restrictions Precautions Precautions: Fall Required Braces or Orthoses: Cervical Brace Cervical Brace: Hard collar;At all times Restrictions Weight Bearing Restrictions: No       Mobility Bed Mobility Overal bed mobility: Needs Assistance Bed Mobility: Supine to Sit, Rolling, Sit to Sidelying Rolling: Min guard Sidelying to sit: Mod assist     Sit to sidelying: Min guard General bed mobility comments: required cues for rolling prior to sitting up (neck precautions); near need for assist to raise his torso from sidelying    Transfers Overall transfer level: Needs assistance Equipment used: Rolling walker (2 wheels) Transfers: Sit to/from Stand Sit to Stand: Min guard, Min assist           General transfer comment: cues for hand placement and safe technique. Assist to steady     Balance Overall balance assessment: Needs assistance Sitting-balance support: Feet supported Sitting balance-Leahy Scale: Fair     Standing balance support: Reliant on assistive device for balance, During functional activity Standing balance-Leahy Scale: Poor Standing balance comment: reliant on external support and/or AD.                           ADL either performed or assessed with clinical judgement   ADL Overall ADL's : Needs assistance/impaired                     Lower Body Dressing: Min guard Lower Body Dressing Details (indicate cue type and reason): donnig shoes. Requires cues for use of compensatory technique Toilet Transfer: Minimal assistance;BSC/3in1;Rolling walker (2 wheels);Stand-pivot TArmed forces technical officerDetails (indicate cue type and reason): [facility], but unable to perform BM due to continued dizziness, and transfer back to bed for safety.  Functional mobility during ADLs: Minimal assistance;Rolling walker (2 wheels);Cueing for safety General ADL Comments: Limited by blood pressure this date. RN  reporting more medication than usual this morning in preparation for procedure, and pt has not eaten. 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, at times has blurred vision   Perception Perception Perception: Not tested   Praxis Praxis Praxis: Not tested    Cognition Arousal/Alertness: Awake/alert Behavior During Therapy: WFL for tasks assessed/performed Overall Cognitive Status: No family/caregiver present to determine baseline cognitive functioning Area of Impairment: Attention, Memory, Following commands, Awareness, Safety/judgement, Problem solving                   Current Attention Level: Sustained Memory: Decreased short-term memory Following Commands: Follows one step commands with increased time Safety/Judgement: Decreased awareness of deficits, Decreased awareness of safety Awareness: Emergent Problem Solving: Slow processing, Requires verbal cues, Requires tactile cues General Comments: Pt with skill to recall small portions of gait training/safety with mobility reviewed by previous therapists. Pt additionally with improved safety awareness overall this date. Becoming dizzy upon standing after taking ~2 steps and taking steps back to bed to initiate sit. Pt frequently appologizing regarding blood pressure being low and "feeling off" reinforcing safety being priority over mobility, and providing education that safety at home is priority over mobility at home and in hospital setting.        Exercises Exercises: Other exercises Other Exercises Other Exercises: Sit to stand 4x Other Exercises: resistive exercise crossing LE and pushing against one another to elevate BP    Shoulder  Instructions       General Comments After stand, BP 96/55 (67); after LE exercise 97/60 (57); supine 119/59 (75). Rn reporting may be due to medication, but if does not stabilize, may want to consider TED hose/LE exercise prior to stand    Pertinent Vitals/ Pain       Pain Assessment Pain Assessment: No/denies pain  Home Living                                          Prior Functioning/Environment              Frequency  Min 2X/week        Progress Toward Goals  OT Goals(current goals can now be found in the care plan section)  Progress towards OT goals: Progressing toward goals  Acute Rehab OT Goals Patient Stated Goal: do more OT Goal Formulation: With patient Time For Goal Achievement: 02/05/22 Potential to Achieve Goals: Good ADL Goals Pt Will Perform Upper Body Dressing: with modified independence;sitting Pt Will Perform Lower Body Dressing: with min assist;sitting/lateral leans;sit to/from stand Pt Will Transfer to Toilet: with supervision;ambulating;regular height toilet Pt Will Perform Tub/Shower Transfer: Shower transfer;shower seat;ambulating;with supervision Additional ADL Goal #1: pt will verbalize 3 fall prevention strategies in order promote safety at home  Plan Discharge plan remains appropriate    Co-evaluation                 AM-PAC OT "6 Clicks" Daily Activity     Outcome Measure   Help  from another person eating meals?: None Help from another person taking care of personal grooming?: A Little Help from another person toileting, which includes using toliet, bedpan, or urinal?: A Lot Help from another person bathing (including washing, rinsing, drying)?: A Lot Help from another person to put on and taking off regular upper body clothing?: A Little Help from another person to put on and taking off regular lower body clothing?: A Lot 6 Click Score: 16    End of Session Equipment Utilized During Treatment: Gait  belt;Rolling walker (2 wheels)  OT Visit Diagnosis: Unsteadiness on feet (R26.81);Other abnormalities of gait and mobility (R26.89);Muscle weakness (generalized) (M62.81);Other symptoms and signs involving cognitive function   Activity Tolerance Treatment limited secondary to medical complications (Comment) (low BP)   Patient Left in chair;with call bell/phone within reach;with chair alarm set   Nurse Communication Mobility status        Time: 1002-1040 OT Time Calculation (min): 38 min  Charges: OT General Charges $OT Visit: 1 Visit OT Treatments $Self Care/Home Management : 23-37 mins $Therapeutic Activity: 8-22 mins  Shanda Howells, OTR/L [facility] Acute Rehabilitation Office: [phone]   Lula Olszewski 01/25/2022, 11:57 AM

## 2022-01-25 NOTE — Progress Notes (Signed)
PROGRESS NOTE    Darryl Diaz  ERD:[id number] DOB: 02-11-1941 DOA: 01/21/2022 PCP: [name], MD   Brief Narrative:  Darryl Diaz is a 81 y.o. male with medical history significant of ESRD on HD MWF, chronic diastolic CHF, paroxysmal A-fib not on anticoagulation due to recurrent falls, tachy-brady syndrome, hypertension, hyperlipidemia, anxiety/depression, GERD, chronic pancytopenia, history of GI bleed, alcohol abuse, alcoholic liver cirrhosis, OSA not on CPAP, history of cardiac arrest in 2020. Patient lives alone at home and presented to the ED tonight via EMS after he had a fall while attempting to get in bed - notes he lost balance without LOC.  CT C-spine showing minimally displaced fracture of the C1 right superior articular process. CT head showing large left frontal scalp hematoma without skull fracture or any acute intracranial abnormality.  Remainder of trauma scans including x-ray of right shoulder and CT of lumbar and thoracic spine negative for acute traumatic injuries. Placed in [place] J collar and neurosurgery consulted (Dr. Christella Noa) who recommends to remain in collar with outpatient follow up in clinic (no indication for intervention based on their evaluation).  Patient medically stable for discharge - awaiting safe disposition to SNF. HD fistula dysfunction 9/18 during dialysis - vascular to evaluate while here awaiting placement plan for temporary dialysis catheter while fistula heals.  Unfortunately while wearing C-spine collar will need to have femoral line placement due to poor access of his IJ.  Assessment & Plan:   Principal Problem:   C1 cervical fracture (HCC) Active Problems:   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HCC)   Paroxysmal atrial fibrillation (HCC)   Hypertension   Hyperlipidemia   Anemia, chronic disease   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HCC)   Chronic diastolic CHF (congestive heart failure) (HCC)   Alcohol abuse   Minimally  displaced fracture of the C1 right superior articular process -Secondary to mechanical fall, denies LOC; does drink occasionally -He is not on any antiplatelet agents or anticoagulation. -Continue [place] J collar per neuroSx until office follow up *(re-educated on the 16th as he was found in bed with his collar off - " I took it off to eat lunch").   Recurrent falls, ambulatory dysfunction Complicated by alcohol use -Patient currently living alone at home -planning to move into independent/assisted living facility this week or next pending logistics/moving his items over -PT/OT recommending SNF   ESRD on HD MWF Chronic diastolic CHF -HD per nephrology -Fistula evaluation given dysfunction - vascular following -temporary dialysis catheter, femoral given IJ access limited by color   Elevated transaminases Alcoholic liver cirrhosis Alcohol use disorder -Alcohol use daily - follow labs/symptoms -CIWA monitoring -Thiamine, folate, multivitamin ongoing   Chronic anemia -Hemoglobin stable -Has a large forehead hematoma/laceration but no other obvious bleeding -Continue to monitor   Chronic thrombocytopenia -Platelet count 99k -Continue to monitor   Large left frontal scalp hematoma with forehead laceration -No skull fracture or acute intracranial abnormality on CT -Forehead laceration repaired per ED with single interrupted suture -removal 01/28/22   QT prolongation -Cardiac monitoring discontinued given no incidents noted for over 48 hours   Paroxysmal A-fib 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Hypertension Hyperlipidemia -Not on anticoagulation due to recurrent falls -Continue home amiodarone, carvedilol, statin  Anxiety/depression -Continue home lexapro  BPH -Continue tamsulosin  GERD -Well controlled  DVT prophylaxis: None given bleeding as above Code Status: DNR Family Communication: None present  Status is: Inpt  Dispo: The patient is from: Home  Anticipated d/c  is to: SNF              Anticipated d/c date is: Imminent              Patient currently medically stable for discharge  Consultants:  NeuroSx, Nephro, Vascular Sx  Procedures:  None  Antimicrobials:  None   Subjective: No acute issue/events overnight, currently only complaint is his n.p.o. status for vascular procedure, otherwise denies nausea vomiting diarrhea constipation headache fevers chills or chest pain.  Objective: Vitals:   01/24/22 2010 01/24/22 2313 01/25/22 0320 01/25/22 0400  BP:  134/64 (!) 140/62   Pulse:  63 62   Resp: '20 16 17 20  '$ Temp:  (!) 97.2 F (36.2 C) 98.4 F (36.9 C)   TempSrc:  Oral Oral   SpO2:  98% 97%   Weight:      Height:        Intake/Output Summary (Last 24 hours) at 01/25/2022 0742 Last data filed at 01/25/2022 0719 Gross per 24 hour  Intake 480 ml  Output 1880 ml  Net -1400 ml    Filed Weights   01/21/22 0033  Weight: 83 kg    Examination:  General exam: Appears calm and comfortable  Respiratory system: Clear to auscultation. Respiratory effort normal. Cardiovascular system: S1 & S2 heard, RRR. No JVD, murmurs, rubs, gallops or clicks. No pedal edema. Gastrointestinal system: Abdomen is nondistended, soft and nontender. No organomegaly or masses felt. Normal bowel sounds heard. Central nervous system: Alert and oriented. No focal neurological deficits. Extremities: Symmetric 5 x 5 power. Skin: Large forehead hematoma/laceration  Data Reviewed: I have personally reviewed following labs and imaging studies  CBC: Recent Labs  Lab 01/21/22 0027 01/21/22 0911 01/22/22 0458 01/24/22 0817 01/24/22 1108  WBC 4.4 4.2 3.1* 3.5* 3.2*  NEUTROABS 2.8  --   --   --   --   HGB 8.8* 8.5* 8.6* 8.0* 8.2*  HCT 25.2* 25.9* 25.4* 22.6* 23.5*  MCV 103.3* 108.4* 105.4* 104.1* 104.0*  PLT 99* 99* 96* 91* 94*    Basic Metabolic Panel: Recent Labs  Lab 01/21/22 0027 01/21/22 0911 01/22/22 0458 01/24/22 0817 01/24/22 1108  NA 133*  134* 137 136 138  K 3.4* 4.0 3.3* 3.4* 3.3*  CL 96* 97* 97* 100 104  CO2 '23 27 27 26 27  '$ GLUCOSE 88 93 86 166* 112*  BUN 37* 36* 16 47* 45*  CREATININE 3.35* 3.32* 1.96* 3.73* 3.73*  CALCIUM 9.3 9.0 9.0 8.8* 8.6*  MG  --  2.1  --   --   --   PHOS  --   --   --  2.0*  --     GFR: Estimated Creatinine Clearance: 17.3 mL/min (A) (by C-G formula based on SCr of 3.73 mg/dL (H)). Liver Function Tests: Recent Labs  Lab 01/21/22 0027 01/24/22 0817  AST 289*  --   ALT 107*  --   ALKPHOS 107  --   BILITOT 1.3*  --   PROT 5.6*  --   ALBUMIN 3.3* 2.9*    No results for input(s): "LIPASE", "AMYLASE" in the last 168 hours. No results for input(s): "AMMONIA" in the last 168 hours. Coagulation Profile: No results for input(s): "INR", "PROTIME" in the last 168 hours. Cardiac Enzymes: No results for input(s): "CKTOTAL", "CKMB", "CKMBINDEX", "TROPONINI" in the last 168 hours. BNP (last 3 results) No results for input(s): "PROBNP" in the last 8760 hours. HbA1C: No results for input(s): "HGBA1C" in the  last 72 hours. CBG: No results for input(s): "GLUCAP" in the last 168 hours. Lipid Profile: No results for input(s): "CHOL", "HDL", "LDLCALC", "TRIG", "CHOLHDL", "LDLDIRECT" in the last 72 hours. Thyroid Function Tests: No results for input(s): "TSH", "T4TOTAL", "FREET4", "T3FREE", "THYROIDAB" in the last 72 hours. Anemia Panel: No results for input(s): "VITAMINB12", "FOLATE", "FERRITIN", "TIBC", "IRON", "RETICCTPCT" in the last 72 hours. Sepsis Labs: No results for input(s): "PROCALCITON", "LATICACIDVEN" in the last 168 hours.  No results found for this or any previous visit (from the past 240 hour(s)).       Radiology Studies: No results found.  Scheduled Meds:  amiodarone  200 mg Oral Daily   atorvastatin  20 mg Oral Daily   carvedilol  6.25 mg Oral BID   Chlorhexidine Gluconate Cloth  6 each Topical Q0600   darbepoetin (ARANESP) injection - DIALYSIS  100 mcg Intravenous Q  Mon-HD   escitalopram  10 mg Oral Daily   folic acid  1 mg Oral Daily   multivitamin with minerals  1 tablet Oral Daily   potassium chloride  20 mEq Oral Once   tamsulosin  0.4 mg Oral QHS   thiamine  100 mg Oral Daily   Or   thiamine  100 mg Intravenous Daily   torsemide  60 mg Oral Daily    LOS: 3 days   Time spent: 88mn  Juanmanuel Marohl C Graylon Amory, DO [facility]  If 7PM-7AM, please contact night-coverage [email or web address]  01/25/2022, 7:42 AM

## 2022-01-25 NOTE — Op Note (Signed)
    NAME: Darryl Diaz    MRN: [id number] DOB: 07/13/1940    DATE OF OPERATION: 01/25/2022  PREOP DIAGNOSIS:    End-stage renal disease  POSTOP DIAGNOSIS:    Same  PROCEDURE:    Ultrasound-guided placement of right femoral 55 cm tunneled dialysis catheter  SURGEON: Judeth Cornfield. Scot Dock, MD  ASSIST: None  ANESTHESIA: Local with sedation  EBL: Minimal  INDICATIONS:    DONTREZ PETTIS is a 81 y.o. male with a cervical fracture.  He has a upper arm fistula which has not been working well and I was asked to place a tunneled dialysis catheter.  FINDINGS:   Both ports withdrew easily with and flushed with heparinized saline and filled with concentrated heparin.  TECHNIQUE:   The patient was taken to the operating room and sedated by anesthesia.  We are asked not to remove the collar and therefore a femoral catheter was placed.  He has a cervical fracture.  Both groins were prepped and draped in usual sterile fashion.  Under ultrasound guidance, after the skin was anesthetized, I cannulated the right common femoral vein with a micropuncture needle and a micropuncture sheath was introduced over a wire.  I then advanced the J-wire into the right atrium.   Next the exit site for the catheter was selected and a small incision made with an 11 blade.  The cath was brought through the tunnel.  The tract over the wire was dilated and then the dilator and peel-away sheath were advanced over the wire and the wire and dilator removed.  The catheter was passed through the peel-away sheath and advanced up to the right atrium.  Both ports withdrew easily with and flushed with heparinized saline and filled with concentrated heparin.  Catheter was secured at its exit site with a 3-0 nylon suture.  The femoral cannulation site was closed with a 4-0 Monocryl.  Sterile dressing was applied.  The patient tolerated procedure well was transferred to the recovery room in stable condition.  All needle and  sponge counts were correct.   Deitra Mayo, MD, FACS [facility]  DATE OF DICTATION:   01/25/2022

## 2022-01-25 NOTE — Anesthesia Procedure Notes (Signed)
Procedure Name: MAC Date/Time: 01/25/2022 2:29 PM  Performed by: Darletta Moll, CRNAPre-anesthesia Checklist: Patient identified, Emergency Drugs available, Suction available and Patient being monitored Patient Re-evaluated:Patient Re-evaluated prior to induction Oxygen Delivery Method: Simple face mask

## 2022-01-25 NOTE — Interval H&P Note (Signed)
History and Physical Interval Note:  01/25/2022 1:50 PM  Darryl Diaz  has presented today for surgery, with the diagnosis of ESRD.  The various methods of treatment have been discussed with the patient and family. After consideration of risks, benefits and other options for treatment, the patient has consented to  Procedure(s): INSERTION OF TUNNELED DIALYSIS CATHETER (N/A) as a surgical intervention.  The patient's history has been reviewed, patient examined, no change in status, stable for surgery.  I have reviewed the patient's chart and labs.  Questions were answered to the patient's satisfaction.     Deitra Mayo

## 2022-01-25 NOTE — Care Management Important Message (Signed)
Important Message  Patient Details  Name: Darryl Diaz MRN: [id number] Date of Birth: [birth year]   Medicare Important Message Given:  Yes     Hannah Beat 01/25/2022, 2:08 PM

## 2022-01-25 NOTE — Progress Notes (Signed)
[facility] Progress Note   Subjective:   Pt reports he is getting "better every day." No pain at present. Denies SOB, CP, dizziness, and nausea. Plan for [facility] today.   Objective Vitals:   01/24/22 2313 01/25/22 0320 01/25/22 0400 01/25/22 0759  BP: 134/64 (!) 140/62  (!) 128/57  Pulse: 63 62  63  Resp: '16 17 20 14  '$ Temp: (!) 97.2 F (36.2 C) 98.4 F (36.9 C)  98 F (36.7 C)  TempSrc: Oral Oral  Oral  SpO2: 98% 97%  100%  Weight:      Height:       Physical Exam General: Alert male in NAD, bruising around eyes/forehead Heart: RRR, no murmurs, rubs or gallops Lungs: CTA bilaterally without wheezing, rhonchi or rales Abdomen: Soft, non-distended, +BS Extremities: No edema b/l lower extremities Dialysis Access: LUEA VF + bruising but strong thrill  Additional Objective Labs: Basic Metabolic Panel: Recent Labs  Lab 01/22/22 0458 01/24/22 0817 01/24/22 1108  NA 137 136 138  K 3.3* 3.4* 3.3*  CL 97* 100 104  CO2 '27 26 27  '$ GLUCOSE 86 166* 112*  BUN 16 47* 45*  CREATININE 1.96* 3.73* 3.73*  CALCIUM 9.0 8.8* 8.6*  PHOS  --  2.0*  --    Liver Function Tests: Recent Labs  Lab 01/21/22 0027 01/24/22 0817  AST 289*  --   ALT 107*  --   ALKPHOS 107  --   BILITOT 1.3*  --   PROT 5.6*  --   ALBUMIN 3.3* 2.9*   No results for input(s): "LIPASE", "AMYLASE" in the last 168 hours. CBC: Recent Labs  Lab 01/21/22 0027 01/21/22 0911 01/22/22 0458 01/24/22 0817 01/24/22 1108  WBC 4.4 4.2 3.1* 3.5* 3.2*  NEUTROABS 2.8  --   --   --   --   HGB 8.8* 8.5* 8.6* 8.0* 8.2*  HCT 25.2* 25.9* 25.4* 22.6* 23.5*  MCV 103.3* 108.4* 105.4* 104.1* 104.0*  PLT 99* 99* 96* 91* 94*   Blood Culture    Component Value Date/Time   SDES URINE, CLEAN CATCH 12/03/2021 0651   SPECREQUEST  12/03/2021 0651    NONE Performed at [facility] [address], [place], [place] [address]    CULT MULTIPLE SPECIES PRESENT, SUGGEST RECOLLECTION (A) 12/03/2021 0651    REPTSTATUS 12/04/2021 FINAL 12/03/2021 0651    Cardiac Enzymes: No results for input(s): "CKTOTAL", "CKMB", "CKMBINDEX", "TROPONINI" in the last 168 hours. CBG: No results for input(s): "GLUCAP" in the last 168 hours. Iron Studies: No results for input(s): "IRON", "TIBC", "TRANSFERRIN", "FERRITIN" in the last 72 hours. '@lablastinr3'$ @ Studies/Results: No results found. Medications:   amiodarone  200 mg Oral Daily   atorvastatin  20 mg Oral Daily   carvedilol  6.25 mg Oral BID   Chlorhexidine Gluconate Cloth  6 each Topical Q0600   darbepoetin (ARANESP) injection - DIALYSIS  100 mcg Intravenous Q Mon-HD   escitalopram  10 mg Oral Daily   folic acid  1 mg Oral Daily   multivitamin with minerals  1 tablet Oral Daily   potassium chloride  20 mEq Oral Once   tamsulosin  0.4 mg Oral QHS   thiamine  100 mg Oral Daily   Or   thiamine  100 mg Intravenous Daily   torsemide  60 mg Oral Daily    Outpatient Dialysis Orders: [facility] MWF 4 hrs 180NRe 400/Autoflow 1.5 83.5 kg 2.0K/2.0 Ca AVF -No Heparin  -Mircera 50 mcg IV q 2 weeks (  Last dose 01/10/2022) -Venofer 100 mg IV X 10 doses (Last dose 01/17/2022)  Assessment/Plan: C1 fracture/scalp hematoma d/t fall at home: Management per primary team and NSG. Lives alone with hx of recurrent falls. Needs SNF placement, patient agrees.  In process.   ESRD -  HD MWF. Continue on schedule, next HD tomorrow.   Hypertension/volume  - BP/volume acceptable. UF to EDW as able, will hope for standing weights next week  Anemia  - Hgb 8.2. Continue aranesp q Monday  Metabolic bone disease -  Ca acceptable. No VDRA. Phos 2.0, not on a binder.  Hypokalemia: Mild, K+ 3.2. Kcl already ordered, will plan to use higher K bath with HD. Currently NPO but may need regular diet since K and phos are both lower  Nutrition - Renal diet with fluid restriction Issues with AVF-AVF appears infiltrated previously, issues with high venous pressures today. VVS planning to  place [facility] today to allow infiltration to heal.     Anice Paganini, PA-C 01/25/2022, 9:19 AM  [facility] Pager: [phone]

## 2022-01-25 NOTE — Anesthesia Preprocedure Evaluation (Addendum)
Anesthesia Evaluation  Patient identified by MRN, date of birth, ID band Patient awake    Reviewed: Allergy & Precautions, NPO status , Patient's Chart, lab work & pertinent test results  Airway Mallampati: III  TM Distance: >3 FB Neck ROM: Limited   Comment: c-collar  Dental  (+) Dental Advisory Given   Pulmonary sleep apnea , former smoker,    breath sounds clear to auscultation       Cardiovascular hypertension, Pt. on medications +CHF  + dysrhythmias Atrial Fibrillation  Rhythm:Regular Rate:Normal     Neuro/Psych C1 fracture- in Ccollar  Neuromuscular disease    GI/Hepatic GERD  ,(+) Cirrhosis     substance abuse  alcohol use,   Endo/Other    Renal/GU ESRF and DialysisRenal disease     Musculoskeletal   Abdominal   Peds  Hematology   Anesthesia Other Findings   Reproductive/Obstetrics                            Lab Results  Component Value Date   WBC 3.2 (L) 01/24/2022   HGB 8.8 (L) 01/25/2022   HCT 26.0 (L) 01/25/2022   MCV 104.0 (H) 01/24/2022   PLT 94 (L) 01/24/2022   Lab Results  Component Value Date   CREATININE 4.10 (H) 01/25/2022   BUN 54 (H) 01/25/2022   NA 135 01/25/2022   K 3.5 01/25/2022   CL 100 01/25/2022   CO2 27 01/24/2022    Anesthesia Physical Anesthesia Plan  ASA: 3  Anesthesia Plan: MAC   Post-op Pain Management: Minimal or no pain anticipated   Induction:   PONV Risk Score and Plan: 1 and Propofol infusion, Ondansetron and Treatment may vary due to age or medical condition  Airway Management Planned: Natural Airway and Simple Face Mask  Additional Equipment:   Intra-op Plan:   Post-operative Plan:   Informed Consent: I have reviewed the patients History and Physical, chart, labs and discussed the procedure including the risks, benefits and alternatives for the proposed anesthesia with the patient or authorized representative who has  indicated his/her understanding and acceptance.   Patient has DNR.  Discussed DNR with patient and Suspend DNR.     Plan Discussed with: CRNA  Anesthesia Plan Comments:        Anesthesia Quick Evaluation

## 2022-01-25 NOTE — Transfer of Care (Signed)
Immediate Anesthesia Transfer of Care Note  Patient: Darryl Diaz  Procedure(s) Performed: INSERTION OF TUNNELED DIALYSIS CATHETER  Patient Location: PACU  Anesthesia Type:MAC  Level of Consciousness: awake, alert , oriented and patient cooperative  Airway & Oxygen Therapy: Patient Spontanous Breathing  Post-op Assessment: Report given to RN, Post -op Vital signs reviewed and stable and Patient moving all extremities X 4  Post vital signs: Reviewed and stable  Last Vitals:  Vitals Value Taken Time  BP 102/61 01/25/22 1530  Temp 36.7 C 01/25/22 1530  Pulse 59 01/25/22 1533  Resp 18 01/25/22 1533  SpO2 98 % 01/25/22 1533  Vitals shown include unvalidated device data.  Last Pain:  Vitals:   01/25/22 1530  TempSrc:   PainSc: 0-No pain         Complications: No notable events documented.  Dr. Ola Spurr aware of BP in 16X systolic initially in PACU, treated.

## 2022-01-25 NOTE — TOC CAGE-AID Note (Signed)
Transition of Care [facility]) - CAGE-AID Screening   Patient Details  Name: Darryl Diaz MRN: [id number] Date of Birth: 1940/09/29  Transition of Care [facility]) CM/SW Contact:    Clovis Cao, RN Phone Number: 01/25/2022, 6:46 PM   Clinical Narrative: Pt has a h/o alcohol abuse but claims he does not drink liked he used to.  Pt does not do drugs.  Pt does not want resources.  Screening complete.   CAGE-AID Screening:    Have You Ever Felt You Ought to Cut Down on Your Drinking or Drug Use?: No Have People Annoyed You By Critizing Your Drinking Or Drug Use?: No Have You Felt Bad Or Guilty About Your Drinking Or Drug Use?: No Have You Ever Had a Drink or Used Drugs First Thing In The Morning to Steady Your Nerves or to Get Rid of a Hangover?: No CAGE-AID Score: 0  Substance Abuse Education Offered: Yes

## 2022-01-26 ENCOUNTER — Encounter (HOSPITAL_COMMUNITY): Payer: Self-pay | Admitting: Vascular Surgery

## 2022-01-26 ENCOUNTER — Other Ambulatory Visit: Payer: Self-pay

## 2022-01-26 DIAGNOSIS — S12000A Unspecified displaced fracture of first cervical vertebra, initial encounter for closed fracture: Secondary | ICD-10-CM | POA: Diagnosis not present

## 2022-01-26 LAB — CBC
HCT: 23.5 % — ABNORMAL LOW (ref 39.0–52.0)
Hemoglobin: 8.2 g/dL — ABNORMAL LOW (ref 13.0–17.0)
MCH: 36.1 pg — ABNORMAL HIGH (ref 26.0–34.0)
MCHC: 34.9 g/dL (ref 30.0–36.0)
MCV: 103.5 fL — ABNORMAL HIGH (ref 80.0–100.0)
Platelets: 111 10*3/uL — ABNORMAL LOW (ref 150–400)
RBC: 2.27 MIL/uL — ABNORMAL LOW (ref 4.22–5.81)
RDW: 16.8 % — ABNORMAL HIGH (ref 11.5–15.5)
WBC: 3.1 10*3/uL — ABNORMAL LOW (ref 4.0–10.5)
nRBC: 0 % (ref 0.0–0.2)

## 2022-01-26 LAB — BASIC METABOLIC PANEL
Anion gap: 9 (ref 5–15)
BUN: 62 mg/dL — ABNORMAL HIGH (ref 8–23)
CO2: 26 mmol/L (ref 22–32)
Calcium: 8.7 mg/dL — ABNORMAL LOW (ref 8.9–10.3)
Chloride: 102 mmol/L (ref 98–111)
Creatinine, Ser: 4.12 mg/dL — ABNORMAL HIGH (ref 0.61–1.24)
GFR, Estimated: 14 mL/min — ABNORMAL LOW (ref >60)
Glucose, Bld: 99 mg/dL (ref 70–99)
Potassium: 3.4 mmol/L — ABNORMAL LOW (ref 3.5–5.1)
Sodium: 137 mmol/L (ref 135–145)

## 2022-01-26 MED ORDER — OXYCODONE HCL 5 MG PO TABS: 2.5000 mg | ORAL_TABLET | Freq: Once | ORAL | Status: DC

## 2022-01-26 MED ORDER — PROSOURCE PLUS PO LIQD
30.0000 mL | Freq: Two times a day (BID) | ORAL | Status: DC
  Administered 2022-01-26 – 2022-01-27 (×2): 30 mL via ORAL
  Filled 2022-01-26 (×2): qty 30

## 2022-01-26 MED ORDER — HEPARIN SODIUM (PORCINE) 1000 UNIT/ML IJ SOLN
INTRAMUSCULAR | Status: AC
  Filled 2022-01-26: qty 7

## 2022-01-26 MED ORDER — NEPRO/CARBSTEADY PO LIQD
237.0000 mL | Freq: Two times a day (BID) | ORAL | Status: DC
  Administered 2022-01-26 – 2022-01-27 (×2): 237 mL via ORAL

## 2022-01-26 NOTE — Progress Notes (Signed)
Seen in dialysis.  Right thigh tunneled dialysis catheter working well.  Left arm AV fistula still has excellent thrill although there is ecchymosis and hematoma from the infiltration on Monday.  Discussed the option of left arm fistulogram but ultimately he states this has been working very well until it was infiltrated Monday in dialysis.  Will allow the hematoma to resolve and then should be able to try and access his fistula again.  If ongoing issues will need fistulogram.  Marty Heck, MD [facility] [place] Office: [place]

## 2022-01-26 NOTE — Consult Note (Signed)
   [facility] Inpatient Consult   01/26/2022  MILT COYE December 03, 1940 [id number]  [facility] [ACO] Patient: Medicare ACO REACH  Primary Care Provider:  [name], MD,  [facility]  Patient screened for hospitalization extreme high risk with 3 unplanned readmission and 10 ED visits in the past 6 months and to assess for potential [place] Management service needs for post hospital transition.  Review of patient's medical record reveals patient has had multiple [facility] engagement attempts. Patient was not on unit rounds earlier, was in HD schedule noted MWF.  Plan:  Continue to follow progress and disposition to assess for post hospital care management needs.  If patient is able to return to SNF will allert [facility] RNCM.  For questions contact:   Natividad Brood, RN BSN [facility] Liaison  [phone] business mobile phone Toll free office [phone]  Fax number: [phone] [email or web address]

## 2022-01-26 NOTE — NC FL2 (Signed)
[place] LEVEL OF CARE SCREENING TOOL     IDENTIFICATION  Patient Name: Darryl Diaz Birthdate: 28-May-1940 Sex: male Admission Date (Current Location): 01/21/2022  [facility] and [place] Number:  Herbalist and Address:  [facility], [place] [address] [place], [place] [address]      Provider Number: [id number]  Attending Physician Name and Address:  Elmarie Shiley, MD  Relative Name and Phone Number:       Current Level of Care: Hospital Recommended Level of Care: 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   Acute hyponatremia 09/27/2021   Incidental finding of COVID-19 virus infection 06/07/2021   Dependence on renal dialysis ([place]) 05/25/2021   Non-pressure chronic ulcer of other part of left foot with unspecified severity ([place]) 05/25/2021   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[place]) 05/25/2021   Osteomyelitis of great toe of left foot ([place])    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[place]) 04/13/2021   Cellulitis of right foot 01/17/2021   Normocytic anemia 01/17/2021   UTI (urinary tract infection) 01/17/2021   Acute GI bleeding 01/06/2021   Pancytopenia ([place]) 12/02/2020   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   Orthostatic hypotension 11/23/2020   Acute renal insufficiency 10/23/2020   Alcohol abuse 10/23/2020   Hypokalemia 10/23/2020   Hypomagnesemia 10/23/2020   Low back pain at multiple sites 10/23/2020   Malnutrition of mild degree Altamease [place]: 75% to less than 90% of standard weight) ([place]) 10/23/2020   Open wound of scalp without complication  30/16/0109   Oral candidiasis 10/23/2020   Cellulitis of left foot    Cellulitis of toe of left foot 10/13/2020   Skin ulcer of left great toe ([place]) 10/13/2020   Chronic diastolic CHF (congestive heart failure) ([place]) 10/13/2020   Transient weakness of left lower extremity 09/07/2020   Abnormality of gait 04/27/2020   Salmonella food poisoning 32/35/5732   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[place]) 03/17/2020   Yeast infection 03/16/2020   Diarrhea 03/16/2020   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 (HCC)    Allergies    Anemia, chronic disease    History of hypertension    Debility    Syncope and collapse 02/22/2020   Weakness 02/06/2020   History of repair of hip joint 10/15/2019   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   Pain in right foot 06/27/2019   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   Bradycardia 04/20/2019   Gastrointestinal hemorrhage 02/25/2019   Fall at home, initial encounter 02/25/2019   Bilateral lower extremity edema 02/25/2019   Chronic hyponatremia 02/25/2019   Fall 02/25/2019   Benign prostatic hyperplasia with lower urinary tract symptoms 12/19/2018   Hardening of the aorta (main artery of the heart) ([place]) 08/11/2018   Benign neoplasm of colon 06/18/2018   History of adenomatous polyp of colon 08/10/2017   Impaired fasting glucose 06/10/2016   Encounter for general adult medical examination without abnormal findings 06/03/2016   Hip fracture ([place]) 03/30/2016   Hypertension 03/30/2016   Hyperlipidemia 03/30/2016   Osteoarthritis 03/30/2016   Depressive disorder 03/30/2016   Rhabdomyolysis 03/30/2016   Leukocytosis 03/30/2016  Pressure ulcer 03/30/2016   GERD (gastroesophageal reflux disease) 12/17/2015   Paroxysmal atrial fibrillation ([place]) 12/17/2015   Long term (current) use of anticoagulants 12/17/2015   Pure hypercholesterolemia 12/17/2015   Seasonal allergic rhinitis 12/17/2015   CAD (coronary artery disease) 08/21/2015     Orientation RESPIRATION BLADDER Height & Weight     Self, Time, Situation, Place  Normal Continent Weight: 77.9 kg Height:  6' (182.9 cm)  BEHAVIORAL SYMPTOMS/MOOD NEUROLOGICAL BOWEL NUTRITION STATUS      Continent Diet (carb modified with thin liquids)  AMBULATORY STATUS COMMUNICATION OF NEEDS Skin   Limited Assist Verbally Other (Comment) (pt has bruising to arms and face/ abrasion to head/)                       Personal Care Assistance Level of Assistance  Bathing, Feeding, Dressing Bathing Assistance: Limited assistance Feeding assistance: Limited assistance Dressing Assistance: Limited assistance     Functional Limitations Info  Sight, Hearing, Speech Sight Info: Impaired Hearing Info: Adequate Speech Info: Adequate    SPECIAL CARE FACTORS FREQUENCY  PT (By licensed PT), OT (By licensed OT)     PT Frequency: 5x/wk OT Frequency: 5x/wk            Contractures Contractures Info: Not present    Additional Factors Info  Code Status, Allergies, Psychotropic Code Status Info: DNR Allergies Info: Penicillin G/ Penicillins Psychotropic Info: Lexapro 10 mg daily         Current Medications (01/26/2022):  This is the current hospital active medication list Current Facility-Administered Medications  Medication Dose Route Frequency Provider Last Rate Last Admin   (feeding supplement) PROSource Plus liquid 30 mL  30 mL Oral BID BM Valentina Gu, NP       amiodarone (PACERONE) tablet 200 mg  200 mg Oral Daily Little Ishikawa, MD   200 mg at 01/25/22 0908   atorvastatin (LIPITOR) tablet 20 mg  20 mg Oral Daily Little Ishikawa, MD   20 mg at 01/26/22 1509   carvedilol (COREG) tablet 6.25 mg  6.25 mg Oral BID Little Ishikawa, MD   6.25 mg at 01/25/22 2217   Chlorhexidine Gluconate Cloth 2 % PADS 6 each  6 each Topical Q0600 Lynnda Child, PA-C   6 each at 01/25/22 6659   Chlorhexidine Gluconate Cloth 2 % PADS 6 each  6 each Topical  Q0600 Janalee Dane, PA-C   6 each at 01/26/22 9357   Darbepoetin Alfa (ARANESP) injection 100 mcg  100 mcg Intravenous Q Mon-HD Pearson Grippe B, MD   100 mcg at 01/24/22 1033   escitalopram (LEXAPRO) tablet 10 mg  10 mg Oral Daily Little Ishikawa, MD   10 mg at 01/26/22 1509   feeding supplement (NEPRO CARB STEADY) liquid 237 mL  237 mL Oral BID BM Valentina Gu, NP   237 mL at 01/77/93 9030   folic acid (FOLVITE) tablet 1 mg  1 mg Oral Daily Shela Leff, MD   1 mg at 01/26/22 1509   heparin sodium (porcine) 1000 UNIT/ML injection            HYDROmorphone (DILAUDID) injection 0.5 mg  0.5 mg Intravenous Q4H PRN Shela Leff, MD   0.5 mg at 01/26/22 0923   multivitamin with minerals tablet 1 tablet  1 tablet Oral Daily Shela Leff, MD   1 tablet at 01/26/22 1509   oxyCODONE (Oxy IR/ROXICODONE) immediate release tablet 2.5 mg  2.5 mg  Oral Once Rise Patience, MD       potassium chloride SA (KLOR-CON M) CR tablet 20 mEq  20 mEq Oral Once Little Ishikawa, MD       tamsulosin [facility]) capsule 0.4 mg  0.4 mg Oral QHS Little Ishikawa, MD   0.4 mg at 01/25/22 2217   thiamine (VITAMIN B1) tablet 100 mg  100 mg Oral Daily Shela Leff, MD   100 mg at 01/26/22 1509   Or   thiamine (VITAMIN B1) injection 100 mg  100 mg Intravenous Daily Shela Leff, MD       torsemide [facility]) tablet 60 mg  60 mg Oral Daily Little Ishikawa, MD   60 mg at 01/26/22 1509     Discharge Medications: Please see discharge summary for a list of discharge medications.  Relevant Imaging Results:  Relevant Lab Results:   Additional Information SSN: [id number]. Covid vaccinated x3. Requires dialysis outpatient transport at [facility] on MWF- chair time is 11:30 am. Has a fistula and currenlty with HD cath in groin  Pollie Friar, RN

## 2022-01-26 NOTE — Progress Notes (Signed)
[facility] Progress Note   Subjective:     Objective Vitals:   01/26/22 0720 01/26/22 0900 01/26/22 0930 01/26/22 1000  BP: 114/62 (!) 106/50 (!) 106/47 98/66  Pulse: 60 (!) 58 (!) 54 (!) 58  Resp:  13 (!) 32 (!) 22  Temp: 97.6 F (36.4 C) 97.8 F (36.6 C)    TempSrc: Oral Oral    SpO2: 98% 95% 97% 96%  Weight:      Height:       Physical Exam General: Very elderly male in NAD HEENT-bruising L eye, scalp laceration. Cervical collar in place.  Heart: S1.S2 RRR No M/R/G. SB on monitor.  Lungs:  CTAB A/P Abdomen: NABS, NT Extremities: NO LE edema Dialysis Access: AVF very bruised + T/B. Should be able to use after period of rest. R femoral TDC blood lines connected.      Additional Objective Labs: Basic Metabolic Panel: Recent Labs  Lab 01/24/22 0817 01/24/22 1108 01/25/22 1355 01/26/22 0748  NA 136 138 135 137  K 3.4* 3.3* 3.5 3.4*  CL 100 104 100 102  CO2 26 27  --  26  GLUCOSE 166* 112* 96 99  BUN 47* 45* 54* 62*  CREATININE 3.73* 3.73* 4.10* 4.12*  CALCIUM 8.8* 8.6*  --  8.7*  PHOS 2.0*  --   --   --    Liver Function Tests: Recent Labs  Lab 01/21/22 0027 01/24/22 0817  AST 289*  --   ALT 107*  --   ALKPHOS 107  --   BILITOT 1.3*  --   PROT 5.6*  --   ALBUMIN 3.3* 2.9*   No results for input(s): "LIPASE", "AMYLASE" in the last 168 hours. CBC: Recent Labs  Lab 01/21/22 0027 01/21/22 0911 01/22/22 0458 01/24/22 0817 01/24/22 1108 01/25/22 1355 01/26/22 0748  WBC 4.4 4.2 3.1* 3.5* 3.2*  --  3.1*  NEUTROABS 2.8  --   --   --   --   --   --   HGB 8.8* 8.5* 8.6* 8.0* 8.2* 8.8* 8.2*  HCT 25.2* 25.9* 25.4* 22.6* 23.5* 26.0* 23.5*  MCV 103.3* 108.4* 105.4* 104.1* 104.0*  --  103.5*  PLT 99* 99* 96* 91* 94*  --  111*   Blood Culture    Component Value Date/Time   SDES URINE, CLEAN CATCH 12/03/2021 0651   SPECREQUEST  12/03/2021 0651    NONE Performed at [facility] [address], [place], [place] [address]     CULT MULTIPLE SPECIES PRESENT, SUGGEST RECOLLECTION (A) 12/03/2021 0651   REPTSTATUS 12/04/2021 FINAL 12/03/2021 0651    Cardiac Enzymes: No results for input(s): "CKTOTAL", "CKMB", "CKMBINDEX", "TROPONINI" in the last 168 hours. CBG: No results for input(s): "GLUCAP" in the last 168 hours. Iron Studies: No results for input(s): "IRON", "TIBC", "TRANSFERRIN", "FERRITIN" in the last 72 hours. '@lablastinr3'$ @ Studies/Results: DG Chest Port 1 View  Result Date: 01/25/2022 CLINICAL DATA:  End-stage renal disease. EXAM: PORTABLE CHEST 1 VIEW COMPARISON:  Radiographs 01/03/2022 and 12/19/2021. FINDINGS: 1554 hours. The heart size and mediastinal contours are stable. There are lower lung volumes with mild resulting bibasilar atelectasis. No edema, confluent airspace opacity or pneumothorax. There may be a small amount of pleural fluid on the left. A superiorly projecting catheter projects over the superior cavoatrial junction, presumably a femorally placed central venous catheter. Telemetry leads overlie the chest. Old right-sided rib fractures are noted. IMPRESSION: 1. Presumed central venous catheter projecting over the superior cavoatrial junction. 2. Bibasilar  atelectasis. Electronically Signed   By: Richardean Sale M.D.   On: 01/25/2022 16:04   DG C-Arm 1-60 Min-No Report  Result Date: 01/25/2022 Fluoroscopy was utilized by the requesting physician.  No radiographic interpretation.   Medications:   amiodarone  200 mg Oral Daily   atorvastatin  20 mg Oral Daily   carvedilol  6.25 mg Oral BID   Chlorhexidine Gluconate Cloth  6 each Topical Q0600   Chlorhexidine Gluconate Cloth  6 each Topical Q0600   darbepoetin (ARANESP) injection - DIALYSIS  100 mcg Intravenous Q Mon-HD   escitalopram  10 mg Oral Daily   folic acid  1 mg Oral Daily   multivitamin with minerals  1 tablet Oral Daily   oxyCODONE  2.5 mg Oral Once   potassium chloride  20 mEq Oral Once   tamsulosin  0.4 mg Oral QHS   thiamine   100 mg Oral Daily   Or   thiamine  100 mg Intravenous Daily   torsemide  60 mg Oral Daily     Outpatient Dialysis Orders: [facility] MWF 4 hrs 180NRe 400/Autoflow 1.5 83.5 kg 2.0K/2.0 Ca AVF -No Heparin  -Mircera 50 mcg IV q 2 weeks (Last dose 01/10/2022) -Venofer 100 mg IV X 10 doses (Last dose 01/17/2022)   Assessment/Plan: C1 fracture/scalp hematoma d/t fall at home: Management per primary team and NSG. Lives alone with hx of recurrent falls. Needs SNF placement, patient agrees.  In process.   ESRD -  HD MWF. Continue on schedule, next HD tomorrow.   Hypertension/volume  - BP/volume acceptable. UF to EDW as able, will hope for standing weights next week  Anemia  - Hgb 8.2. Continue aranesp q Monday. Transfuse PRN.   Metabolic bone disease -  Ca acceptable. No VDRA. Phos 2.0, not on a binder. Change to Carb Mod diet. He can have a Coca [employer] daily! Hypokalemia: Mild, K+ 3.4. Kcl already ordered, will plan to use higher K bath with HD. Change to Carb Mod diet.   Nutrition - Change to Carb mod diet. Add protein supplements.  Issues with AVF-AVF appears infiltrated previously, issues with high venous pressures and pain.  R femoral TDC placed 01/25/2022 per Dr. Scot Dock. Appreciate assistance from [facility].   Jimmye Norman. Latronda Spink NP-C 01/26/2022, 10:37 AM  [employer] [phone]

## 2022-01-26 NOTE — TOC Initial Note (Addendum)
Transition of Care [facility]) - Initial/Assessment Note    Patient Details  Name: Darryl Diaz MRN: [id number] Date of Birth: 1940-11-25  Transition of Care [facility]) CM/SW Contact:    Pollie Friar, RN Phone Number: 01/26/2022, 3:45 PM  Clinical Narrative:                 Pt is from home alone and has suffered multiple falls. Recommendations are for SNF rehab and pt is in agreement. Pt asked to be faxed out to [facility] as he has been there before and it is next to his HD center. CM sent the referral and updated Nikki at [facility].  TOC following.  1557: [facility] has offered a bed for tomorrow.   Expected Discharge Plan: Skilled Nursing Facility Barriers to Discharge: Continued Medical Work up   Patient Goals and CMS Choice   CMS Medicare.gov Compare Post Acute Care list provided to:: Patient Choice offered to / list presented to : Patient  Expected Discharge Plan and Services Expected Discharge Plan: [place] In-house Referral: Clinical Social Work Discharge Planning Services: CM Consult Post Acute Care Choice: [place] arrangements for the past 2 months: [place]                                      Prior Living Arrangements/Services Living arrangements for the past 2 months: Single Family Home Lives with:: Self Patient language and need for interpreter reviewed:: Yes Do you feel safe going back to the place where you live?: No   falls  Need for Family Participation in Patient Care: Yes (Comment) Care giver support system in place?: No (comment)   Criminal Activity/Legal Involvement Pertinent to Current Situation/Hospitalization: No - Comment as needed  Activities of Daily Living      Permission Sought/Granted                  Emotional Assessment Appearance:: Appears stated age Attitude/Demeanor/Rapport: Engaged Affect (typically observed): Accepting Orientation: : Oriented to Self, Oriented to  Place, Oriented to  Time, Oriented to Situation   Psych Involvement: No (comment)  Admission diagnosis:  C1 cervical fracture ([place]) [S12.000A] Closed nondisplaced fracture of first cervical vertebra, unspecified fracture morphology, initial encounter ([place]) [S12.001A] Patient Active Problem List   Diagnosis Date Noted   C1 cervical fracture ([place]) 01/21/2022   Generalized weakness 11/24/2021   Severe sepsis ([place]) 11/24/2021   Lactic acidosis 11/24/2021   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   Acute hyponatremia 09/27/2021   Incidental finding of COVID-19 virus infection 06/07/2021   Dependence on renal dialysis ([place]) 05/25/2021   Non-pressure chronic ulcer of other part of left foot with unspecified severity ([place]) 05/25/2021   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[place]) 05/25/2021   Osteomyelitis of great toe of left foot ([place])    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[place]) 04/13/2021   Cellulitis of right foot 01/17/2021   Normocytic anemia 01/17/2021   UTI (urinary tract infection) 01/17/2021   Acute GI bleeding 01/06/2021   Pancytopenia ([place]) 12/02/2020   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   Orthostatic hypotension 11/23/2020   Acute renal insufficiency 10/23/2020   Alcohol abuse 10/23/2020   Hypokalemia 10/23/2020   Hypomagnesemia 10/23/2020   Low back pain at multiple sites 10/23/2020   Malnutrition of mild degree Altamease Oiler: 75% to less than 90% of standard weight) ([place]) 10/23/2020  Open wound of scalp without complication 38/02/1750   Oral candidiasis 10/23/2020   Cellulitis of left foot    Cellulitis of toe of left foot 10/13/2020   Skin ulcer of left great toe ([place]) 10/13/2020   Chronic diastolic CHF (congestive heart failure) ([place]) 10/13/2020   Transient weakness of left lower extremity 09/07/2020   Abnormality of gait 04/27/2020   Salmonella food poisoning 02/58/5277   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[place]) 03/17/2020    Yeast infection 03/16/2020   Diarrhea 03/16/2020   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 (HCC)    Allergies    Anemia, chronic disease    History of hypertension    Debility    Syncope and collapse 02/22/2020   Weakness 02/06/2020   History of repair of hip joint 10/15/2019   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   Pain in right foot 06/27/2019   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   Bradycardia 04/20/2019   Gastrointestinal hemorrhage 02/25/2019   Fall at home, initial encounter 02/25/2019   Bilateral lower extremity edema 02/25/2019   Chronic hyponatremia 02/25/2019   Fall 02/25/2019   Benign prostatic hyperplasia with lower urinary tract symptoms 12/19/2018   Hardening of the aorta (main artery of the heart) ([place]) 08/11/2018   Benign neoplasm of colon 06/18/2018   History of adenomatous polyp of colon 08/10/2017   Impaired fasting glucose 06/10/2016   Encounter for general adult medical examination without abnormal findings 06/03/2016   Hip fracture ([place]) 03/30/2016   Hypertension 03/30/2016   Hyperlipidemia 03/30/2016   Osteoarthritis 03/30/2016   Depressive disorder 03/30/2016   Rhabdomyolysis 03/30/2016   Leukocytosis 03/30/2016   Pressure ulcer 03/30/2016   GERD (gastroesophageal reflux disease) 12/17/2015   Paroxysmal atrial fibrillation ([place]) 12/17/2015   Long term (current) use of anticoagulants 12/17/2015   Pure hypercholesterolemia 12/17/2015   Seasonal allergic rhinitis 12/17/2015   CAD (coronary artery disease) 08/21/2015   PCP:  [name], MD Pharmacy:   [facility], [place] [address] Phone: [phone] Fax: [phone]     Social Determinants of Health (SDOH) Interventions    Readmission Risk Interventions    09/29/2021    2:21 PM 01/18/2021    9:28 AM 03/18/2020    4:18 PM  Readmission Risk Prevention Plan  Transportation Screening Complete  Complete Complete  HRI or Home Care Consult   Complete  Social Work Consult for [place] Planning/Counseling   Complete  Palliative Care Screening   Not Applicable  Medication Review Press photographer) Complete Complete Complete  PCP or Specialist appointment within 3-5 days of discharge Complete Complete   HRI or Home Care Consult Complete Complete   SW Recovery Care/Counseling Consult Complete Complete   Palliative Care Screening Not Applicable Not Applicable   Skilled Nursing Facility Complete Complete

## 2022-01-26 NOTE — Progress Notes (Signed)
PT Cancellation Note  Patient Details Name: Darryl Diaz MRN: [id number] DOB: March 16, 1941   Cancelled Treatment:    Reason Eval/Treat Not Completed: Patient at procedure or test/unavailable  Patient off unit at dialysis.    [place]  Office [phone]   Rexanne Mano 01/26/2022, 11:49 AM

## 2022-01-26 NOTE — TOC CAGE-AID Note (Signed)
Transition of Care [facility]) - CAGE-AID Screening   Patient Details  Name: Darryl Diaz MRN: [id number] Date of Birth: 08/10/1940  Transition of Care [facility]) CM/SW Contact:    Pollie Friar, RN Phone Number: 01/26/2022, 3:47 PM   Clinical Narrative: Pt denied the need for inpatient/ outpatient alcohol counseling.   CAGE-AID Screening:    Have You Ever Felt You Ought to Cut Down on Your Drinking or Drug Use?: No Have People Annoyed You By Critizing Your Drinking Or Drug Use?: No Have You Felt Bad Or Guilty About Your Drinking Or Drug Use?: No Have You Ever Had a Drink or Used Drugs First Thing In The Morning to Steady Your Nerves or to Get Rid of a Hangover?: No CAGE-AID Score: 0  Substance Abuse Education Offered: Yes

## 2022-01-26 NOTE — Progress Notes (Signed)
Received patient in bed to unit.  Alert and oriented.  Informed consent signed and in chart.   Treatment initiated: 0930 Treatment completed: 3167  Patient tolerated well.  Transported back to the room  Alert, without acute distress.  Hand-off given to patient's nurse.   Access used: Cath  Access issues: none  Total UF removed: 1.5L Medication(s) given: none Post HD VS: 108/44,56,97.8,100% Post HD weight: 77.9kg   [facility]

## 2022-01-26 NOTE — Progress Notes (Signed)
Darryl Diaz  JEH:[id number] DOB: 1941-03-29 DOA: 01/21/2022 PCP: [name], MD   Brief Narrative: 81 year old with past medical history significant for ESRD on hemodialysis M WF, chronic diastolic heart failure, paroxysmal A-fib not on anticoagulation due to recurrence for, tachybradycardia syndrome, hypertension, hyperlipidemia, anxiety/depression, chronic pancytopenia history of GI bleed, alcohol abuse, alcoholic liver cirrhosis, OSA not on CPAP history of cardiac arrest 2020, lives alone at home presented to the ED after a fall when he was attempting getting out of the bed, lost balance without loss of consciousness.  CT spine showed minimal displaced fracture of C1 right superior articular process.  CT head showing large left frontal scalp hematoma without skull fracture or any acute intracranial abnormality.  Remainder of trauma scan including x-ray of the right shoulder and CT lumbar and thoracic negative for acute traumatic injuries.  Patient was placed on J collar and neurosurgery consulted Dr. Christella Noa who recommended patient to remain in collar with outpatient follow-up in the clinic for further evaluation.  Patient is medically stable for discharge awaiting skilled nursing facility.  Hemodialysis fistula dysfunction 918, underwent femoral I placement due to poor access for his IJ.   Assessment & Plan:   Principal Problem:   C1 cervical fracture (HCC) Active Problems:   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HCC)   Paroxysmal atrial fibrillation (HCC)   Hypertension   Hyperlipidemia   Anemia, chronic disease   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HCC)   Chronic diastolic CHF (congestive heart failure) (HCC)   Alcohol abuse   1-Minimally displaced fracture of C1 right superior articular process: Secondary to mechanical fall. He is not on any antiplatelet agents or anticoagulation. Continue with [place] J collar per neurosurgery until office  follow-up. He will need skilled nursing facility placement  2-Recurrent falls ambulatory dysfunction, complicated by alcohol use PT OT recommending skilled awaiting placement  3-ESRD on hemodialysis MWF, chronic diastolic heart failure Fistula dysfunction, underwent temporary dialysis catheter femoral access  4-Transaminases, alcoholic liver cirrhosis, alcohol use disorder Monitor on CIWA.  Continue thiamine and folic acid supplementation  5-Chronic anemia Hemoglobin stable, continue to monitor  Chronic thrombocytopenia: Follow trend  Large left frontal skull hematoma with forehead laceration -No skull fracture or acute intracranial abnormality on CT -Forehead laceration repaired per ED with single interrupted suture -removal 01/28/22  Prolonged QT corrected electrolytes with lysis  Paroxysmal A-fib, tachybradycardia syndrome, hypertension, hyperlipidemia Continue home amiodarone and statins Bradycardia holding carvedilol today  Anxiety/depression: Continue with Lexapro  BPH continue with tamsulosin   Estimated body mass index is 23.29 kg/m as calculated from the following:   Height as of this encounter: 6' (1.829 m).   Weight as of this encounter: 77.9 kg.   DVT prophylaxis: SCD Code Status: DNR Family Communication: Care discussed with patient Disposition Plan:  Status is: Inpatient Remains inpatient appropriate because: awaiting SNF    Consultants:  Nephrology Vascular Neurosurgery   Procedures:    Antimicrobials:    Subjective: He is alert, report mild neck pain   Objective: Vitals:   01/26/22 1200 01/26/22 1300 01/26/22 1354 01/26/22 1510  BP: (!) 99/51 (!) 97/51 (!) 108/44 (!) 115/54  Pulse: (!) 57 (!) 59 (!) 57 (!) 56  Resp: '17 19 14 16  '$ Temp:   97.8 F (36.6 C)   TempSrc:      SpO2: 92% 100% 100% 100%  Weight:   77.9 kg   Height:        Intake/Output Summary (  Last 24 hours) at 01/26/2022 1512 Last data filed at 01/26/2022 1354 Gross  per 24 hour  Intake 200 ml  Output 151.5 ml  Net 48.5 ml   Filed Weights   01/21/22 0033 01/26/22 1354  Weight: 83 kg 77.9 kg    Examination:  General exam: facial hematoma Respiratory system: Clear to auscultation. Respiratory effort normal. Cardiovascular system: S1 & S2 heard, RRR.  Gastrointestinal system: Abdomen is nondistended, soft and nontender. No organomegaly or masses felt. Normal bowel sounds heard. Central nervous system: Alert  Extremities: no edema  Data Reviewed: I have personally reviewed following labs and imaging studies  CBC: Recent Labs  Lab 01/21/22 0027 01/21/22 0911 01/22/22 0458 01/24/22 0817 01/24/22 1108 01/25/22 1355 01/26/22 0748  WBC 4.4 4.2 3.1* 3.5* 3.2*  --  3.1*  NEUTROABS 2.8  --   --   --   --   --   --   HGB 8.8* 8.5* 8.6* 8.0* 8.2* 8.8* 8.2*  HCT 25.2* 25.9* 25.4* 22.6* 23.5* 26.0* 23.5*  MCV 103.3* 108.4* 105.4* 104.1* 104.0*  --  103.5*  PLT 99* 99* 96* 91* 94*  --  979*   Basic Metabolic Panel: Recent Labs  Lab 01/21/22 0911 01/22/22 0458 01/24/22 0817 01/24/22 1108 01/25/22 1355 01/26/22 0748  NA 134* 137 136 138 135 137  K 4.0 3.3* 3.4* 3.3* 3.5 3.4*  CL 97* 97* 100 104 100 102  CO2 '27 27 26 27  '$ --  26  GLUCOSE 93 86 166* 112* 96 99  BUN 36* 16 47* 45* 54* 62*  CREATININE 3.32* 1.96* 3.73* 3.73* 4.10* 4.12*  CALCIUM 9.0 9.0 8.8* 8.6*  --  8.7*  MG 2.1  --   --   --   --   --   PHOS  --   --  2.0*  --   --   --    GFR: Estimated Creatinine Clearance: 15.7 mL/min (A) (by C-G formula based on SCr of 4.12 mg/dL (H)). Liver Function Tests: Recent Labs  Lab 01/21/22 0027 01/24/22 0817  AST 289*  --   ALT 107*  --   ALKPHOS 107  --   BILITOT 1.3*  --   PROT 5.6*  --   ALBUMIN 3.3* 2.9*   No results for input(s): "LIPASE", "AMYLASE" in the last 168 hours. No results for input(s): "AMMONIA" in the last 168 hours. Coagulation Profile: No results for input(s): "INR", "PROTIME" in the last 168 hours. Cardiac  Enzymes: No results for input(s): "CKTOTAL", "CKMB", "CKMBINDEX", "TROPONINI" in the last 168 hours. BNP (last 3 results) No results for input(s): "PROBNP" in the last 8760 hours. HbA1C: No results for input(s): "HGBA1C" in the last 72 hours. CBG: No results for input(s): "GLUCAP" in the last 168 hours. Lipid Profile: No results for input(s): "CHOL", "HDL", "LDLCALC", "TRIG", "CHOLHDL", "LDLDIRECT" in the last 72 hours. Thyroid Function Tests: No results for input(s): "TSH", "T4TOTAL", "FREET4", "T3FREE", "THYROIDAB" in the last 72 hours. Anemia Panel: No results for input(s): "VITAMINB12", "FOLATE", "FERRITIN", "TIBC", "IRON", "RETICCTPCT" in the last 72 hours. Sepsis Labs: No results for input(s): "PROCALCITON", "LATICACIDVEN" in the last 168 hours.  No results found for this or any previous visit (from the past 240 hour(s)).       Radiology Studies: DG Chest Port 1 View  Result Date: 01/25/2022 CLINICAL DATA:  End-stage renal disease. EXAM: PORTABLE CHEST 1 VIEW COMPARISON:  Radiographs 01/03/2022 and 12/19/2021. FINDINGS: 1554 hours. The heart size and mediastinal contours are stable.  There are lower lung volumes with mild resulting bibasilar atelectasis. No edema, confluent airspace opacity or pneumothorax. There may be a small amount of pleural fluid on the left. A superiorly projecting catheter projects over the superior cavoatrial junction, presumably a femorally placed central venous catheter. Telemetry leads overlie the chest. Old right-sided rib fractures are noted. IMPRESSION: 1. Presumed central venous catheter projecting over the superior cavoatrial junction. 2. Bibasilar atelectasis. Electronically Signed   By: Richardean Sale M.D.   On: 01/25/2022 16:04   DG C-Arm 1-60 Min-No Report  Result Date: 01/25/2022 Fluoroscopy was utilized by the requesting physician.  No radiographic interpretation.        Scheduled Meds:  (feeding supplement) PROSource Plus  30 mL Oral  BID BM   amiodarone  200 mg Oral Daily   atorvastatin  20 mg Oral Daily   carvedilol  6.25 mg Oral BID   Chlorhexidine Gluconate Cloth  6 each Topical Q0600   Chlorhexidine Gluconate Cloth  6 each Topical Q0600   darbepoetin (ARANESP) injection - DIALYSIS  100 mcg Intravenous Q Mon-HD   escitalopram  10 mg Oral Daily   feeding supplement (NEPRO CARB STEADY)  237 mL Oral BID BM   folic acid  1 mg Oral Daily   heparin sodium (porcine)       multivitamin with minerals  1 tablet Oral Daily   oxyCODONE  2.5 mg Oral Once   potassium chloride  20 mEq Oral Once   tamsulosin  0.4 mg Oral QHS   thiamine  100 mg Oral Daily   Or   thiamine  100 mg Intravenous Daily   torsemide  60 mg Oral Daily   Continuous Infusions:   LOS: 4 days    Time spent: 35 minutes    Dolly Harbach A Sharine Cadle, MD [facility]   If 7PM-7AM, please contact night-coverage [email or web address]  01/26/2022, 3:12 PM

## 2022-01-26 NOTE — Anesthesia Postprocedure Evaluation (Signed)
Anesthesia Post Note  Patient: Darryl Diaz  Procedure(s) Performed: INSERTION OF TUNNELED DIALYSIS CATHETER     Patient location during evaluation: PACU Anesthesia Type: MAC Level of consciousness: awake and alert Pain management: pain level controlled Vital Signs Assessment: post-procedure vital signs reviewed and stable Respiratory status: spontaneous breathing, nonlabored ventilation, respiratory function stable and patient connected to nasal cannula oxygen Cardiovascular status: stable and blood pressure returned to baseline Postop Assessment: no apparent nausea or vomiting Anesthetic complications: no   No notable events documented.  Last Vitals:  Vitals:   01/26/22 1510 01/26/22 1939  BP: (!) 115/54 (!) 96/50  Pulse: (!) 56 71  Resp: 16 20  Temp:  36.7 C  SpO2: 100% 100%    Last Pain:  Vitals:   01/26/22 1939  TempSrc: Oral  PainSc: 0-No pain                 Tiajuana Amass

## 2022-01-27 DIAGNOSIS — S12000A Unspecified displaced fracture of first cervical vertebra, initial encounter for closed fracture: Secondary | ICD-10-CM | POA: Diagnosis not present

## 2022-01-27 MED ORDER — SACCHAROMYCES BOULARDII 250 MG PO CAPS: 250.0000 mg | ORAL_CAPSULE | Freq: Two times a day (BID) | ORAL | Status: DC

## 2022-01-27 MED ORDER — VITAMIN B-1 100 MG PO TABS: 100.0000 mg | ORAL_TABLET | Freq: Every day | ORAL | 0 refills | Status: DC

## 2022-01-27 MED ORDER — SACCHAROMYCES BOULARDII 250 MG PO CAPS: 250.0000 mg | ORAL_CAPSULE | Freq: Two times a day (BID) | ORAL | 1 refills | Status: AC

## 2022-01-27 NOTE — Progress Notes (Signed)
Discharge instructions placed in packet for receiving facility. Iv removed. Glasses, cellphone, charger, & clothing returned to patient. Report called to [employer].  Gwendolyn Grant, RN

## 2022-01-27 NOTE — Discharge Summary (Signed)
Physician Discharge Summary   Patient: Darryl Diaz MRN: [id number] DOB: [birth year]  Admit date:     01/21/2022  Discharge date: 01/27/22  Discharge Physician: Elmarie Shiley   PCP: [name], MD   Recommendations at discharge:   Continue with HD.  Needs to follow up with Dr Christella Noa neurosurgery in 1 week. Needs to use [place] cervical collar.   Discharge Diagnoses: Principal Problem:   C1 cervical fracture (HCC) Active Problems:   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HCC)   Paroxysmal atrial fibrillation (HCC)   Hypertension   Hyperlipidemia   Anemia, chronic disease   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HCC)   Chronic diastolic CHF (congestive heart failure) ([place])   Alcohol abuse  Resolved Problems:   * No resolved hospital problems. *  Hospital Course: 81 year old with past medical history significant for ESRD on hemodialysis M WF, chronic diastolic heart failure, paroxysmal A-fib not on anticoagulation due to recurrence for, tachybradycardia syndrome, hypertension, hyperlipidemia, anxiety/depression, chronic pancytopenia history of GI bleed, alcohol abuse, alcoholic liver cirrhosis, OSA not on CPAP history of cardiac arrest 2020, lives alone at home presented to the ED after a fall when he was attempting getting out of the bed, lost balance without loss of consciousness.  CT spine showed minimal displaced fracture of C1 right superior articular process.  CT head showing large left frontal scalp hematoma without skull fracture or any acute intracranial abnormality.  Remainder of trauma scan including x-ray of the right shoulder and CT lumbar and thoracic negative for acute traumatic injuries.  Patient was placed on J collar and neurosurgery consulted Dr. Christella Noa who recommended patient to remain in collar with outpatient follow-up in the clinic for further evaluation.  Patient is medically stable for discharge awaiting skilled nursing facility.  Hemodialysis  fistula dysfunction 918, underwent femoral I placement due to poor access for his IJ.   Assessment and Plan:   1-Minimally displaced fracture of C1 right superior articular process: Secondary to mechanical fall. He is not on any antiplatelet agents or anticoagulation. Continue with [place] J collar per neurosurgery until office follow-up. He will need skilled nursing facility placement Stable for rehab.   2-Recurrent falls ambulatory dysfunction, complicated by alcohol use PT OT recommending skilled awaiting placement   3-ESRD on hemodialysis MWF, chronic diastolic heart failure Fistula dysfunction, underwent temporary dialysis catheter femoral access   4-Transaminases, alcoholic liver cirrhosis, alcohol use disorder Monitor on CIWA.  Continue thiamine and folic acid supplementation   5-Chronic anemia Hemoglobin stable, continue to monitor   Chronic thrombocytopenia: Follow trend   Large left frontal skull hematoma with forehead laceration -No skull fracture or acute intracranial abnormality on CT -Forehead laceration repaired per ED with single interrupted suture -removal 01/28/22   Prolonged QT Corrected electrolytes with lysis   Paroxysmal A-fib, tachybradycardia syndrome, hypertension, hyperlipidemia Continue home amiodarone and statins HR increase. Continue with carvedilol. Hold as needed.    Anxiety/depression: Continue with Lexapro   BPH continue with tamsulosin     Estimated body mass index is 23.29 kg/m as calculated from the following:   Height as of this encounter: 6' (1.829 m).   Weight as of this encounter: 77.9 kg.       Consultants: Nephrology Neurosurgery  Procedures performed: Femeral catheter  Disposition: Skilled nursing facility Diet recommendation:  Discharge Diet Orders (From admission, onward)     Start     Ordered   01/27/22 0000  Diet - low sodium heart healthy  01/27/22 1017           Cardiac diet DISCHARGE  MEDICATION: Allergies as of 01/27/2022       Reactions   Penicillin G Sodium Rash, Other (See Comments)    (tolerates Keflex)   Penicillins Rash        Medication List     TAKE these medications    amiodarone 200 MG tablet Commonly known as: PACERONE TAKE 1 TABLET BY MOUTH ONCE DAILY What changed: when to take this   atorvastatin 20 MG tablet Commonly known as: LIPITOR Take 1 tablet (20 mg total) by mouth daily.   carvedilol 6.25 MG tablet Commonly known as: COREG Take 1 tablet (6.25 mg total) by mouth 2 (two) times daily. What changed:  how much to take when to take this   cetirizine 10 MG tablet Commonly known as: ZYRTEC Take 10 mg by mouth daily.   escitalopram 10 MG tablet Commonly known as: LEXAPRO Take 1 tablet (10 mg total) by mouth daily. What changed: when to take this   ferrous sulfate 324 MG Tbec Take 324 mg by mouth every morning.   FOLIC ACID PO Take 1 tablet by mouth every morning.   Magnesium Oxide 400 MG Caps Take 1 capsule (400 mg total) by mouth daily.   MULTIVITAMIN PO Take 1 tablet by mouth every other day.   nitroGLYCERIN 0.4 MG SL tablet Commonly known as: NITROSTAT Place 1 tablet (0.4 mg total) under the tongue every 5 (five) minutes x 3 doses as needed for chest pain.   pantoprazole 40 MG tablet Commonly known as: PROTONIX Take 1 tablet (40 mg total) by mouth daily. What changed: when to take this   saccharomyces boulardii 250 MG capsule Commonly known as: FLORASTOR Take 1 capsule (250 mg total) by mouth 2 (two) times daily.   sodium bicarbonate 650 MG tablet Take 650 mg by mouth daily.   tamsulosin 0.4 MG Caps capsule Commonly known as: FLOMAX Take 0.4 mg by mouth at bedtime.   thiamine 100 MG tablet Commonly known as: Vitamin B-1 Take 1 tablet (100 mg total) by mouth daily. Start taking on: January 28, 2022   torsemide 20 MG tablet Commonly known as: DEMADEX Take 60 mg by mouth daily.   traZODone 50 MG  tablet Commonly known as: DESYREL Take 1 tablet (50 mg total) by mouth at bedtime.               Discharge Care Instructions  (From admission, onward)           Start     Ordered   01/27/22 0000  Discharge wound care:       Comments: See above   01/27/22 1017            Discharge Exam: Filed Weights   01/21/22 0033 01/26/22 1354  Weight: 83 kg 77.9 kg   General NAD  Condition at discharge: stable  The results of significant diagnostics from this hospitalization (including imaging, microbiology, ancillary and laboratory) are listed below for reference.   Imaging Studies: DG Chest Port 1 View  Result Date: 01/25/2022 CLINICAL DATA:  End-stage renal disease. EXAM: PORTABLE CHEST 1 VIEW COMPARISON:  Radiographs 01/03/2022 and 12/19/2021. FINDINGS: 1554 hours. The heart size and mediastinal contours are stable. There are lower lung volumes with mild resulting bibasilar atelectasis. No edema, confluent airspace opacity or pneumothorax. There may be a small amount of pleural fluid on the left. A superiorly projecting catheter projects over the superior cavoatrial  junction, presumably a femorally placed central venous catheter. Telemetry leads overlie the chest. Old right-sided rib fractures are noted. IMPRESSION: 1. Presumed central venous catheter projecting over the superior cavoatrial junction. 2. Bibasilar atelectasis. Electronically Signed   By: Richardean Sale M.D.   On: 01/25/2022 16:04   DG C-Arm 1-60 Min-No Report  Result Date: 01/25/2022 Fluoroscopy was utilized by the requesting physician.  No radiographic interpretation.   CT Thoracic Spine Wo Contrast  Result Date: 01/21/2022 CLINICAL DATA:  Fall EXAM: CT THORACIC AND LUMBAR SPINE WITHOUT CONTRAST TECHNIQUE: Multidetector CT imaging of the thoracic and lumbar spine was performed without contrast. Multiplanar CT image reconstructions were also generated. RADIATION DOSE REDUCTION: This exam was performed  according to the departmental dose-optimization program which includes automated exposure control, adjustment of the mA and/or kV according to patient size and/or use of iterative reconstruction technique. COMPARISON:  None Available. FINDINGS: CT THORACIC SPINE FINDINGS Alignment: Normal. Vertebrae: No acute fracture or focal pathologic process. Spondylosis of the lower thoracic spine. Paraspinal and other soft tissues: Calcific aortic atherosclerosis. Disc levels: No high-grade spinal canal stenosis CT LUMBAR SPINE FINDINGS Segmentation: 5 lumbar type vertebrae. Alignment: Normal. Vertebrae: No acute fracture or focal pathologic process. Paraspinal and other soft tissues: Negative. Disc levels: There is moderate-to-severe spinal canal stenosis at the L2-5 levels. IMPRESSION: 1. No acute fracture or static subluxation of the thoracic or lumbar spine. 2. 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 3. Aortic Atherosclerosis (ICD10-I70.0). Electronically Signed   By: Ulyses Jarred M.D.   On: 01/21/2022 02:12   CT Lumbar Spine Wo Contrast  Result Date: 01/21/2022 CLINICAL DATA:  Fall EXAM: CT THORACIC AND LUMBAR SPINE WITHOUT CONTRAST TECHNIQUE: Multidetector CT imaging of the thoracic and lumbar spine was performed without contrast. Multiplanar CT image reconstructions were also generated. RADIATION DOSE REDUCTION: This exam was performed according to the departmental dose-optimization program which includes automated exposure control, adjustment of the mA and/or kV according to patient size and/or use of iterative reconstruction technique. COMPARISON:  None Available. FINDINGS: CT THORACIC SPINE FINDINGS Alignment: Normal. Vertebrae: No acute fracture or focal pathologic process. Spondylosis of the lower thoracic spine. Paraspinal and other soft tissues: Calcific aortic atherosclerosis. Disc levels: No high-grade spinal canal stenosis CT LUMBAR SPINE FINDINGS Segmentation: 5 lumbar type  vertebrae. Alignment: Normal. Vertebrae: No acute fracture or focal pathologic process. Paraspinal and other soft tissues: Negative. Disc levels: There is moderate-to-severe spinal canal stenosis at the L2-5 levels. IMPRESSION: 1. No acute fracture or static subluxation of the thoracic or lumbar spine. 2. 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 3. Aortic Atherosclerosis (ICD10-I70.0). Electronically Signed   By: Ulyses Jarred M.D.   On: 01/21/2022 02:12   CT HEAD WO CONTRAST (5MM)  Result Date: 01/21/2022 CLINICAL DATA:  Fall EXAM: CT HEAD WITHOUT CONTRAST CT CERVICAL SPINE WITHOUT CONTRAST TECHNIQUE: Multidetector CT imaging of the head and cervical spine was performed following the standard protocol without intravenous contrast. Multiplanar CT image reconstructions of the cervical spine were also generated. RADIATION DOSE REDUCTION: This exam was performed according to the departmental dose-optimization program which includes automated exposure control, adjustment of the mA and/or kV according to patient size and/or use of iterative reconstruction technique. COMPARISON:  None Available. FINDINGS: CT HEAD FINDINGS Brain: There is no mass, hemorrhage or extra-axial collection. There is generalized atrophy without lobar predilection. There is hypoattenuation of the periventricular white matter, most commonly indicating chronic ischemic microangiopathy. Vascular: No abnormal hyperdensity of the major intracranial arteries  or dural venous sinuses. No intracranial atherosclerosis. Skull: Large left frontal scalp hematoma.  No skull fracture. Sinuses/Orbits: No fluid levels or advanced mucosal thickening of the visualized paranasal sinuses. No mastoid or middle ear effusion. The orbits are normal. CT CERVICAL SPINE FINDINGS Alignment: No static subluxation. Facets are aligned. Occipital condyles are normally positioned. Skull base and vertebrae: There is a minimally displaced fracture of the C1  right superior articular process where it contacts the right occipital condyle. There is no other fracture of the cervical spine. Soft tissues and spinal canal: No prevertebral fluid or swelling. No visible canal hematoma. Disc levels: No advanced spinal canal or neural foraminal stenosis. Upper chest: No pneumothorax, pulmonary nodule or pleural effusion. Other: Normal visualized paraspinal cervical soft tissues. IMPRESSION: 1. Minimally displaced fracture of the C1 right superior articular process where it contacts the right occipital condyle. 2. No acute intracranial abnormality. 3. Large left frontal scalp hematoma without skull fracture. These results were called by telephone at the time of interpretation on 01/21/2022 at 2:07 am to provider MADISON [facility] , who verbally acknowledged these results. Electronically Signed   By: Ulyses Jarred M.D.   On: 01/21/2022 02:07   CT Cervical Spine Wo Contrast  Result Date: 01/21/2022 CLINICAL DATA:  Fall EXAM: CT HEAD WITHOUT CONTRAST CT CERVICAL SPINE WITHOUT CONTRAST TECHNIQUE: Multidetector CT imaging of the head and cervical spine was performed following the standard protocol without intravenous contrast. Multiplanar CT image reconstructions of the cervical spine were also generated. RADIATION DOSE REDUCTION: This exam was performed according to the departmental dose-optimization program which includes automated exposure control, adjustment of the mA and/or kV according to patient size and/or use of iterative reconstruction technique. COMPARISON:  None Available. FINDINGS: CT HEAD FINDINGS Brain: There is no mass, hemorrhage or extra-axial collection. There is generalized atrophy without lobar predilection. There is hypoattenuation of the periventricular white matter, most commonly indicating chronic ischemic microangiopathy. Vascular: No abnormal hyperdensity of the major intracranial arteries or dural venous sinuses. No intracranial atherosclerosis. Skull: Large  left frontal scalp hematoma.  No skull fracture. Sinuses/Orbits: No fluid levels or advanced mucosal thickening of the visualized paranasal sinuses. No mastoid or middle ear effusion. The orbits are normal. CT CERVICAL SPINE FINDINGS Alignment: No static subluxation. Facets are aligned. Occipital condyles are normally positioned. Skull base and vertebrae: There is a minimally displaced fracture of the C1 right superior articular process where it contacts the right occipital condyle. There is no other fracture of the cervical spine. Soft tissues and spinal canal: No prevertebral fluid or swelling. No visible canal hematoma. Disc levels: No advanced spinal canal or neural foraminal stenosis. Upper chest: No pneumothorax, pulmonary nodule or pleural effusion. Other: Normal visualized paraspinal cervical soft tissues. IMPRESSION: 1. Minimally displaced fracture of the C1 right superior articular process where it contacts the right occipital condyle. 2. No acute intracranial abnormality. 3. Large left frontal scalp hematoma without skull fracture. These results were called by telephone at the time of interpretation on 01/21/2022 at 2:07 am to provider MADISON [facility] , who verbally acknowledged these results. Electronically Signed   By: Ulyses Jarred M.D.   On: 01/21/2022 02:07   DG Shoulder Right  Result Date: 01/21/2022 CLINICAL DATA:  Fall, right shoulder pain EXAM: RIGHT SHOULDER - 2+ VIEW COMPARISON:  None Available. FINDINGS: Joints Fe cyst narrowing and spurring in the [facility] and glenohumeral joints. No acute bony abnormality. Specifically, no fracture, subluxation, or dislocation. Old healed right rib fractures. IMPRESSION: No acute bony  abnormality. Electronically Signed   By: Rolm Baptise M.D.   On: 01/21/2022 01:43   CT Head Wo Contrast  Result Date: 01/03/2022 CLINICAL DATA:  Recent syncopal event with headaches and neck pain, initial encounter EXAM: CT HEAD WITHOUT CONTRAST CT CERVICAL SPINE WITHOUT  CONTRAST TECHNIQUE: Multidetector CT imaging of the head and cervical spine was performed following the standard protocol without intravenous contrast. Multiplanar CT image reconstructions of the cervical spine were also generated. RADIATION DOSE REDUCTION: This exam was performed according to the departmental dose-optimization program which includes automated exposure control, adjustment of the mA and/or kV according to patient size and/or use of iterative reconstruction technique. COMPARISON:  12/15/2021 FINDINGS: CT HEAD FINDINGS Brain: No evidence of acute infarction, hemorrhage, hydrocephalus, extra-axial collection or mass lesion/mass effect. Chronic atrophic and white matter ischemic changes are noted. Vascular: No hyperdense vessel or unexpected calcification. Skull: Normal. Negative for fracture or focal lesion. Sinuses/Orbits: No acute finding. Other: None. CT CERVICAL SPINE FINDINGS Alignment: Loss of normal cervical lordosis is again seen. Skull base and vertebrae: 7 cervical segments are well visualized. Multilevel osteophytic changes and facet hypertrophic changes are seen. No acute fracture or acute facet abnormality is noted. Soft tissues and spinal canal: Surrounding soft tissue structures show vascular calcifications. No adenopathy or hematoma is seen. Upper chest: Visualized lung apices are within normal limits. Other: None IMPRESSION: CT of the head: Chronic atrophic and ischemic changes without acute abnormality. CT of cervical spine: Multilevel degenerative change without acute abnormality. Electronically Signed   By: Inez Catalina M.D.   On: 01/03/2022 23:29   CT Cervical Spine Wo Contrast  Result Date: 01/03/2022 CLINICAL DATA:  Recent syncopal event with headaches and neck pain, initial encounter EXAM: CT HEAD WITHOUT CONTRAST CT CERVICAL SPINE WITHOUT CONTRAST TECHNIQUE: Multidetector CT imaging of the head and cervical spine was performed following the standard protocol without  intravenous contrast. Multiplanar CT image reconstructions of the cervical spine were also generated. RADIATION DOSE REDUCTION: This exam was performed according to the departmental dose-optimization program which includes automated exposure control, adjustment of the mA and/or kV according to patient size and/or use of iterative reconstruction technique. COMPARISON:  12/15/2021 FINDINGS: CT HEAD FINDINGS Brain: No evidence of acute infarction, hemorrhage, hydrocephalus, extra-axial collection or mass lesion/mass effect. Chronic atrophic and white matter ischemic changes are noted. Vascular: No hyperdense vessel or unexpected calcification. Skull: Normal. Negative for fracture or focal lesion. Sinuses/Orbits: No acute finding. Other: None. CT CERVICAL SPINE FINDINGS Alignment: Loss of normal cervical lordosis is again seen. Skull base and vertebrae: 7 cervical segments are well visualized. Multilevel osteophytic changes and facet hypertrophic changes are seen. No acute fracture or acute facet abnormality is noted. Soft tissues and spinal canal: Surrounding soft tissue structures show vascular calcifications. No adenopathy or hematoma is seen. Upper chest: Visualized lung apices are within normal limits. Other: None IMPRESSION: CT of the head: Chronic atrophic and ischemic changes without acute abnormality. CT of cervical spine: Multilevel degenerative change without acute abnormality. Electronically Signed   By: Inez Catalina M.D.   On: 01/03/2022 23:29   DG Chest Portable 1 View  Result Date: 01/03/2022 CLINICAL DATA:  Fall syncope EXAM: PORTABLE CHEST 1 VIEW COMPARISON:  12/02/2021 FINDINGS: No acute airspace disease. Stable cardiomediastinal silhouette. No pneumothorax. Old appearing right-sided rib fractures. IMPRESSION: No active disease. Electronically Signed   By: Donavan Foil M.D.   On: 01/03/2022 21:59    Microbiology: Results for orders placed or performed during the hospital encounter of  12/19/21   Resp Panel by RT-PCR (Flu A&B, Covid) Anterior Nasal Swab     Status: None   Collection Time: 12/20/21 12:11 AM   Specimen: Anterior Nasal Swab  Result Value Ref Range Status   SARS Coronavirus 2 by RT PCR NEGATIVE NEGATIVE Final    Comment: (NOTE) SARS-CoV-2 target nucleic acids are NOT DETECTED.  The SARS-CoV-2 RNA is generally detectable in upper respiratory specimens during the acute phase of infection. The lowest concentration of SARS-CoV-2 viral copies this assay can detect is 138 copies/mL. A negative result does not preclude SARS-Cov-2 infection and should not be used as the sole basis for treatment or other patient management decisions. A negative result may occur with  improper specimen collection/handling, submission of specimen other than nasopharyngeal swab, presence of viral mutation(s) within the areas targeted by this assay, and inadequate number of viral copies(<138 copies/mL). A negative result must be combined with clinical observations, patient history, and epidemiological information. The expected result is Negative.  Fact Sheet for Patients:  EntrepreneurPulse.com.au  Fact Sheet for Healthcare Providers:  IncredibleEmployment.be  This test is no t yet approved or cleared by the [place] FDA and  has been authorized for detection and/or diagnosis of SARS-CoV-2 by FDA under an Emergency Use Authorization (EUA). This EUA will remain  in effect (meaning this test can be used) for the duration of the COVID-19 declaration under Section 564(b)(1) of the Act, 21 U.S.C.section 360bbb-3(b)(1), unless the authorization is terminated  or revoked sooner.       Influenza A by PCR NEGATIVE NEGATIVE Final   Influenza B by PCR NEGATIVE NEGATIVE Final    Comment: (NOTE) The Xpert Xpress SARS-CoV-2/FLU/RSV plus assay is intended as an aid in the diagnosis of influenza from Nasopharyngeal swab specimens and should not be used as a  sole basis for treatment. Nasal washings and aspirates are unacceptable for Xpert Xpress SARS-CoV-2/FLU/RSV testing.  Fact Sheet for Patients: EntrepreneurPulse.com.au  Fact Sheet for Healthcare Providers: IncredibleEmployment.be  This test is not yet approved or cleared by the [place] FDA and has been authorized for detection and/or diagnosis of SARS-CoV-2 by FDA under an Emergency Use Authorization (EUA). This EUA will remain in effect (meaning this test can be used) for the duration of the COVID-19 declaration under Section 564(b)(1) of the Act, 21 U.S.C. section 360bbb-3(b)(1), unless the authorization is terminated or revoked.  Performed at [facility], [place] [address], [place], [place] [address]     Labs: CBC: Recent Labs  Lab 01/21/22 0027 01/21/22 0911 01/22/22 0458 01/24/22 0817 01/24/22 1108 01/25/22 1355 01/26/22 0748  WBC 4.4 4.2 3.1* 3.5* 3.2*  --  3.1*  NEUTROABS 2.8  --   --   --   --   --   --   HGB 8.8* 8.5* 8.6* 8.0* 8.2* 8.8* 8.2*  HCT 25.2* 25.9* 25.4* 22.6* 23.5* 26.0* 23.5*  MCV 103.3* 108.4* 105.4* 104.1* 104.0*  --  103.5*  PLT 99* 99* 96* 91* 94*  --  245*   Basic Metabolic Panel: Recent Labs  Lab 01/21/22 0911 01/22/22 0458 01/24/22 0817 01/24/22 1108 01/25/22 1355 01/26/22 0748  NA 134* 137 136 138 135 137  K 4.0 3.3* 3.4* 3.3* 3.5 3.4*  CL 97* 97* 100 104 100 102  CO2 '27 27 26 27  '$ --  26  GLUCOSE 93 86 166* 112* 96 99  BUN 36* 16 47* 45* 54* 62*  CREATININE 3.32* 1.96* 3.73* 3.73* 4.10* 4.12*  CALCIUM 9.0  9.0 8.8* 8.6*  --  8.7*  MG 2.1  --   --   --   --   --   PHOS  --   --  2.0*  --   --   --    Liver Function Tests: Recent Labs  Lab 01/21/22 0027 01/24/22 0817  AST 289*  --   ALT 107*  --   ALKPHOS 107  --   BILITOT 1.3*  --   PROT 5.6*  --   ALBUMIN 3.3* 2.9*   CBG: No results for input(s): "GLUCAP" in the last 168 hours.  Discharge time spent: greater than 30  minutes.  Signed: Elmarie Shiley, MD [facility] 01/27/2022

## 2022-01-27 NOTE — Progress Notes (Signed)
Pt D/C to [facility] today. Contacted [facility] and spoke to [place]. Clinic advised of pt's d/c today and that pt will resume care tomorrow. Clinic advised of snf placement at [facility] as well.   Melven Sartorius Renal Navigator [phone]

## 2022-01-27 NOTE — TOC Transition Note (Signed)
Transition of Care [facility]) - CM/SW Discharge Note   Patient Details  Name: Darryl Diaz MRN: [id number] Date of Birth: Aug 29, 1940  Transition of Care [facility] CM/SW Contact:  Pollie Friar, RN Phone Number: 01/27/2022, 10:10 AM   Clinical Narrative:    Pt is discharging to [facility] today. He will transport via [place]. Bedside RN updated and d/c packet it at the desk.   Number for report: [phone]   Final next level of care: Skilled Nursing Facility Barriers to Discharge: No Barriers Identified   Patient Goals and CMS Choice   CMS Medicare.gov Compare Post Acute Care list provided to:: Patient Choice offered to / list presented to : Patient  Discharge Placement PASRR number recieved: 01/26/22            Patient chooses bed at: [facility] Patient to be transferred to facility by: [facility]   Patient and family notified of of transfer: 01/27/22  Discharge Plan and Services In-house Referral: Clinical Social Work Discharge Planning Services: CM Consult Post Acute Care Choice: [facility]                               Social Determinants of Health (SDOH) Interventions     Readmission Risk Interventions    09/29/2021    2:21 PM 01/18/2021    9:28 AM 03/18/2020    4:18 PM  Readmission Risk Prevention Plan  Transportation Screening Complete Complete Complete  HRI or Home Care Consult   Complete  Social Work Consult for [place] Planning/Counseling   Complete  Palliative Care Screening   Not Applicable  Medication Review Press photographer) Complete Complete Complete  PCP or Specialist appointment within 3-5 days of discharge Complete Complete   HRI or Home Care Consult Complete Complete   SW Recovery Care/Counseling Consult Complete Complete   Palliative Care Screening Not Applicable Not Applicable   Skilled Nursing Facility Complete Complete

## 2022-01-28 DIAGNOSIS — S12000A Unspecified displaced fracture of first cervical vertebra, initial encounter for closed fracture: Secondary | ICD-10-CM | POA: Diagnosis not present

## 2022-01-28 DIAGNOSIS — Z992 Dependence on renal dialysis: Secondary | ICD-10-CM | POA: Diagnosis not present

## 2022-01-28 DIAGNOSIS — D509 Iron deficiency anemia, unspecified: Secondary | ICD-10-CM | POA: Diagnosis not present

## 2022-01-28 DIAGNOSIS — I480 Paroxysmal atrial fibrillation: Secondary | ICD-10-CM | POA: Diagnosis not present

## 2022-01-28 DIAGNOSIS — I5020 Unspecified systolic (congestive) heart failure: Secondary | ICD-10-CM | POA: Diagnosis not present

## 2022-01-28 DIAGNOSIS — N2581 Secondary hyperparathyroidism of renal origin: Secondary | ICD-10-CM | POA: Diagnosis not present

## 2022-01-28 DIAGNOSIS — S065X9S Traumatic subdural hemorrhage with loss of consciousness of unspecified duration, sequela: Secondary | ICD-10-CM | POA: Diagnosis not present

## 2022-01-28 DIAGNOSIS — D631 Anemia in chronic kidney disease: Secondary | ICD-10-CM | POA: Diagnosis not present

## 2022-01-28 DIAGNOSIS — N186 End stage renal disease: Secondary | ICD-10-CM | POA: Diagnosis not present

## 2022-01-31 ENCOUNTER — Emergency Department (HOSPITAL_COMMUNITY)
Admission: EM | Admit: 2022-01-31 | Discharge: 2022-02-01 | Disposition: A | Discharge status: Skilled Nursing Facility | Payer: Medicare Other | Attending: Emergency Medicine | Admitting: Emergency Medicine

## 2022-01-31 ENCOUNTER — Emergency Department (HOSPITAL_COMMUNITY): Payer: Medicare Other

## 2022-01-31 DIAGNOSIS — R609 Edema, unspecified: Secondary | ICD-10-CM | POA: Diagnosis not present

## 2022-01-31 DIAGNOSIS — W1839XA Other fall on same level, initial encounter: Secondary | ICD-10-CM | POA: Diagnosis not present

## 2022-01-31 DIAGNOSIS — I959 Hypotension, unspecified: Secondary | ICD-10-CM | POA: Diagnosis not present

## 2022-01-31 DIAGNOSIS — N2581 Secondary hyperparathyroidism of renal origin: Secondary | ICD-10-CM | POA: Diagnosis not present

## 2022-01-31 DIAGNOSIS — Z4889 Encounter for other specified surgical aftercare: Secondary | ICD-10-CM | POA: Diagnosis not present

## 2022-01-31 DIAGNOSIS — Y92129 Unspecified place in nursing home as the place of occurrence of the external cause: Secondary | ICD-10-CM | POA: Diagnosis not present

## 2022-01-31 DIAGNOSIS — D631 Anemia in chronic kidney disease: Secondary | ICD-10-CM | POA: Diagnosis not present

## 2022-01-31 DIAGNOSIS — S0990XA Unspecified injury of head, initial encounter: Secondary | ICD-10-CM | POA: Diagnosis not present

## 2022-01-31 DIAGNOSIS — Y9301 Activity, walking, marching and hiking: Secondary | ICD-10-CM | POA: Diagnosis not present

## 2022-01-31 DIAGNOSIS — L7622 Postprocedural hemorrhage and hematoma of skin and subcutaneous tissue following other procedure: Secondary | ICD-10-CM | POA: Diagnosis not present

## 2022-01-31 DIAGNOSIS — I10 Essential (primary) hypertension: Secondary | ICD-10-CM | POA: Diagnosis not present

## 2022-01-31 DIAGNOSIS — S0003XA Contusion of scalp, initial encounter: Secondary | ICD-10-CM | POA: Diagnosis not present

## 2022-01-31 DIAGNOSIS — R0902 Hypoxemia: Secondary | ICD-10-CM | POA: Diagnosis not present

## 2022-01-31 DIAGNOSIS — D509 Iron deficiency anemia, unspecified: Secondary | ICD-10-CM | POA: Diagnosis not present

## 2022-01-31 DIAGNOSIS — N186 End stage renal disease: Secondary | ICD-10-CM | POA: Diagnosis not present

## 2022-01-31 DIAGNOSIS — R58 Hemorrhage, not elsewhere classified: Secondary | ICD-10-CM | POA: Diagnosis not present

## 2022-01-31 DIAGNOSIS — S12000A Unspecified displaced fracture of first cervical vertebra, initial encounter for closed fracture: Secondary | ICD-10-CM | POA: Diagnosis not present

## 2022-01-31 DIAGNOSIS — Z992 Dependence on renal dialysis: Secondary | ICD-10-CM | POA: Diagnosis not present

## 2022-01-31 MED ORDER — LIDOCAINE-EPINEPHRINE (PF) 2 %-1:200000 IJ SOLN
10.0000 mL | Freq: Once | INTRAMUSCULAR | Status: DC
  Filled 2022-01-31: qty 20

## 2022-01-31 NOTE — ED Provider Notes (Signed)
[facility] EMERGENCY DEPARTMENT Provider Note   CSN: [id number] Arrival date & time: 01/31/22  2023     History  No chief complaint on file.   Darryl Diaz is a 81 y.o. male.  HPI  Per the patient, he was walking at his nursing home whenever he fell and "pulled his femoral line".  He was attempting to use the bathroom with assistance whenever he fell.  Patient recently had a femoral dialysis catheter placed due to difficulty with his left arm fistula.  Patient states that during the fall a "stitch came loose".  Patient had minor bleeding from the site.  Due to the patient having bleeding around the catheter site, patient came to the emergency department for evaluation.  Patient states that he did not sustain any additional injuries in the fall.  States that he is now having significant pain at the catheter site and the bleeding was stopped.     Home Medications Prior to Admission medications   Medication Sig Start Date End Date Taking? Authorizing Provider  amiodarone (PACERONE) 200 MG tablet TAKE 1 TABLET BY MOUTH ONCE DAILY Patient taking differently: Take 200 mg by mouth every morning. 05/17/21   Richardson Dopp T, PA-C  atorvastatin (LIPITOR) 20 MG tablet Take 1 tablet (20 mg total) by mouth daily. 03/10/20   Angiulli, Lavon Paganini, PA-C  carvedilol (COREG) 6.25 MG tablet Take 1 tablet (6.25 mg total) by mouth 2 (two) times daily. Patient taking differently: Take 12.5 mg by mouth daily. 02/11/21   Sherren Mocha, MD  cetirizine (ZYRTEC) 10 MG tablet Take 10 mg by mouth daily.    [provider]  escitalopram (LEXAPRO) 10 MG tablet Take 1 tablet (10 mg total) by mouth daily. Patient taking differently: Take 10 mg by mouth every morning. 03/10/20   Angiulli, Lavon Paganini, PA-C  ferrous sulfate 324 MG TBEC Take 324 mg by mouth every morning.    [provider]  FOLIC ACID PO Take 1 tablet by mouth every morning.    [provider]  Magnesium  Oxide 400 MG CAPS Take 1 capsule (400 mg total) by mouth daily. 10/17/20   Eugenie Filler, MD  Multiple Vitamin (MULTIVITAMIN PO) Take 1 tablet by mouth every other day.    [provider]  nitroGLYCERIN (NITROSTAT) 0.4 MG SL tablet Place 1 tablet (0.4 mg total) under the tongue every 5 (five) minutes x 3 doses as needed for chest pain. 07/28/20   Baldwin Jamaica, PA-C  pantoprazole (PROTONIX) 40 MG tablet Take 1 tablet (40 mg total) by mouth daily. Patient taking differently: Take 40 mg by mouth every morning. 03/10/20   Angiulli, Lavon Paganini, PA-C  saccharomyces boulardii (FLORASTOR) 250 MG capsule Take 1 capsule (250 mg total) by mouth 2 (two) times daily. 01/27/22   Regalado, Belkys A, MD  sodium bicarbonate 650 MG tablet Take 650 mg by mouth daily.    [provider]  tamsulosin (FLOMAX) 0.4 MG CAPS capsule Take 0.4 mg by mouth at bedtime.    [provider]  thiamine (VITAMIN B-1) 100 MG tablet Take 1 tablet (100 mg total) by mouth daily. 01/28/22   Regalado, Belkys A, MD  torsemide (DEMADEX) 20 MG tablet Take 60 mg by mouth daily.    [provider]  traZODone (DESYREL) 50 MG tablet Take 1 tablet (50 mg total) by mouth at bedtime. 11/26/21   Alma Friendly, MD      Allergies    Penicillin  g sodium and Penicillins    Review of Systems   Review of Systems  Physical Exam Updated Vital Signs There were no vitals taken for this visit. Physical Exam Vitals and nursing note reviewed.  Constitutional:      General: He is not in acute distress.    Appearance: He is well-developed.  HENT:     Head: Normocephalic.     Comments: Significant facial bruising from prior fall.  Cervical collar in place Eyes:     Conjunctiva/sclera: Conjunctivae normal.  Cardiovascular:     Rate and Rhythm: Normal rate and regular rhythm.     Heart sounds: No murmur heard. Pulmonary:     Effort: Pulmonary effort is normal. No respiratory distress.     Breath sounds:  Normal breath sounds.  Abdominal:     Palpations: Abdomen is soft.     Tenderness: There is no abdominal tenderness.  Musculoskeletal:        General: No swelling. Normal range of motion.     Cervical back: Neck supple.  Skin:    General: Skin is warm and dry.     Capillary Refill: Capillary refill takes less than 2 seconds.     Comments: Right femoral dialysis catheter in place.  Single stitch holding the catheter in place.  No active bleeding at the site.  No hard signs of vascular injury.  Neurological:     Mental Status: He is alert.  Psychiatric:        Mood and Affect: Mood normal.     ED Results / Procedures / Treatments   Labs (all labs ordered are listed, but only abnormal results are displayed) Labs Reviewed - No data to display  EKG None  Radiology No results found.  Procedures Procedures    Medications Ordered in ED Medications - No data to display  ED Course/ Medical Decision Making/ A&P                           Medical Decision Making Amount and/or Complexity of Data Reviewed Radiology: ordered.  Risk Prescription drug management.   Medical Decision Making  This patient is Presenting for Evaluation of femoral dialysis site, which does require a range of treatment options, and is a complaint that involves a moderate risk of morbidity and mortality.  Arrived in ED by:  EMS History obtained from: The patient  Limitations in history: none    At this time I am most concerned for dialysis catheter displacement. Also considering vascular injury, hematoma. Plan for observation, suture placement, imaging studies   Radiologic work-up was significant for: -CT head and C-spine showed no acute abnormalities.   Interventions and Interval History: -Patient is generally well-appearing on initial exam.  He has no evidence of significant vascular injury, he has no tenderness at the site, the wound is hemostatic and a single stitch from the Vas-Cath is  still in place therefore seriously doubt significant displacement of the line.  Additional suture was placed for better security of the line, patient tolerated well. Single suture placed with 3.0 Ethilon  -Patient did not experience additional injury in the fall.  He states that he is feeling well is not having any pain or discomfort anywhere.  Due to the patient being generally well-appearing having a relatively low mechanism of the fall, patient not be on any anticoagulation for trauma evaluation after clinically indicated this time.  However due to the patient being high risk for intracranial  and cervical abnormalities, CT head and C-spine was obtained, no acute abnormalities. -Patient no additional complaints at this time, felt to be appropriate for discharge back to SNF      Disposition: Due to the patients current presenting symptoms, physical exam findings, and the workup stated above, it is thought that the etiology of the patients current presentation is suture displacement   Discharge: Patient is felt to be medically appropriate for discharge at this time. Patient was informed of all pertinent physical exam, laboratory, and imaging findings. Patients suspected etiology of their symptom presentation was discussed with the patient and all questions were answered. Patient was instructed to follow up with their primary care doctor as needed for re-evaluation. Patient was given strict return precautions.    The plan for this patient was discussed with Dr. Laverta Baltimore, who voiced agreement and who oversaw evaluation and treatment of this patient.     Clinical Complexity  A medically appropriate history, review of systems, and physical exam was performed.   I personally reviewed the lab and imaging studies discussed above.   MDM generated using voice dictation software and may contain dictation errors. Please contact me for any clarification or with any questions.                Final Clinical Impression(s) / ED Diagnoses Final diagnoses:  None    Rx / DC Orders ED Discharge Orders     None         Naelani Lafrance, [place], MD 01/31/22 2237    Margette Fast, MD 02/01/22 1103

## 2022-01-31 NOTE — ED Notes (Signed)
[facility] CALLED  °

## 2022-01-31 NOTE — ED Triage Notes (Signed)
PT BIB [facility] b/c he had a mechanical fall with no obvious injuries but he has a femoral line that got snagged and was bleeding so he is here to have it assessed.  VSS

## 2022-01-31 NOTE — Discharge Instructions (Signed)
Please return to the emergency department if you develop any new or worsening symptoms, significant pain from right femoral line site, abnormal bleeding, swelling or significant bruising around site or any other concerning symptoms.  Please follow-up with your nephrologist for additional management

## 2022-02-01 DIAGNOSIS — Z7401 Bed confinement status: Secondary | ICD-10-CM | POA: Diagnosis not present

## 2022-02-01 DIAGNOSIS — R58 Hemorrhage, not elsewhere classified: Secondary | ICD-10-CM | POA: Diagnosis not present

## 2022-02-02 DIAGNOSIS — D631 Anemia in chronic kidney disease: Secondary | ICD-10-CM | POA: Diagnosis not present

## 2022-02-02 DIAGNOSIS — D509 Iron deficiency anemia, unspecified: Secondary | ICD-10-CM | POA: Diagnosis not present

## 2022-02-02 DIAGNOSIS — N186 End stage renal disease: Secondary | ICD-10-CM | POA: Diagnosis not present

## 2022-02-02 DIAGNOSIS — Z9181 History of falling: Secondary | ICD-10-CM | POA: Diagnosis not present

## 2022-02-02 DIAGNOSIS — Z992 Dependence on renal dialysis: Secondary | ICD-10-CM | POA: Diagnosis not present

## 2022-02-02 DIAGNOSIS — N2581 Secondary hyperparathyroidism of renal origin: Secondary | ICD-10-CM | POA: Diagnosis not present

## 2022-02-02 DIAGNOSIS — S12000A Unspecified displaced fracture of first cervical vertebra, initial encounter for closed fracture: Secondary | ICD-10-CM | POA: Diagnosis not present

## 2022-02-04 DIAGNOSIS — Z992 Dependence on renal dialysis: Secondary | ICD-10-CM | POA: Diagnosis not present

## 2022-02-04 DIAGNOSIS — N2581 Secondary hyperparathyroidism of renal origin: Secondary | ICD-10-CM | POA: Diagnosis not present

## 2022-02-04 DIAGNOSIS — N186 End stage renal disease: Secondary | ICD-10-CM | POA: Diagnosis not present

## 2022-02-04 DIAGNOSIS — D509 Iron deficiency anemia, unspecified: Secondary | ICD-10-CM | POA: Diagnosis not present

## 2022-02-04 DIAGNOSIS — D631 Anemia in chronic kidney disease: Secondary | ICD-10-CM | POA: Diagnosis not present

## 2022-02-06 DIAGNOSIS — N186 End stage renal disease: Secondary | ICD-10-CM | POA: Diagnosis not present

## 2022-02-06 DIAGNOSIS — Z992 Dependence on renal dialysis: Secondary | ICD-10-CM | POA: Diagnosis not present

## 2022-02-06 DIAGNOSIS — I129 Hypertensive chronic kidney disease with stage 1 through stage 4 chronic kidney disease, or unspecified chronic kidney disease: Secondary | ICD-10-CM | POA: Diagnosis not present

## 2022-02-07 DIAGNOSIS — N186 End stage renal disease: Secondary | ICD-10-CM | POA: Diagnosis not present

## 2022-02-07 DIAGNOSIS — S12000A Unspecified displaced fracture of first cervical vertebra, initial encounter for closed fracture: Secondary | ICD-10-CM | POA: Diagnosis not present

## 2022-02-07 DIAGNOSIS — N2581 Secondary hyperparathyroidism of renal origin: Secondary | ICD-10-CM | POA: Diagnosis not present

## 2022-02-07 DIAGNOSIS — I480 Paroxysmal atrial fibrillation: Secondary | ICD-10-CM | POA: Diagnosis not present

## 2022-02-07 DIAGNOSIS — S065X9S Traumatic subdural hemorrhage with loss of consciousness of unspecified duration, sequela: Secondary | ICD-10-CM | POA: Diagnosis not present

## 2022-02-07 DIAGNOSIS — Z992 Dependence on renal dialysis: Secondary | ICD-10-CM | POA: Diagnosis not present

## 2022-02-08 DIAGNOSIS — N186 End stage renal disease: Secondary | ICD-10-CM | POA: Diagnosis not present

## 2022-02-08 DIAGNOSIS — S12000A Unspecified displaced fracture of first cervical vertebra, initial encounter for closed fracture: Secondary | ICD-10-CM | POA: Diagnosis not present

## 2022-02-08 DIAGNOSIS — K219 Gastro-esophageal reflux disease without esophagitis: Secondary | ICD-10-CM | POA: Diagnosis not present

## 2022-02-08 DIAGNOSIS — Z9181 History of falling: Secondary | ICD-10-CM | POA: Diagnosis not present

## 2022-02-09 DIAGNOSIS — N2581 Secondary hyperparathyroidism of renal origin: Secondary | ICD-10-CM | POA: Diagnosis not present

## 2022-02-09 DIAGNOSIS — N186 End stage renal disease: Secondary | ICD-10-CM | POA: Diagnosis not present

## 2022-02-09 DIAGNOSIS — Z992 Dependence on renal dialysis: Secondary | ICD-10-CM | POA: Diagnosis not present

## 2022-02-11 DIAGNOSIS — N186 End stage renal disease: Secondary | ICD-10-CM | POA: Diagnosis not present

## 2022-02-11 DIAGNOSIS — N2581 Secondary hyperparathyroidism of renal origin: Secondary | ICD-10-CM | POA: Diagnosis not present

## 2022-02-11 DIAGNOSIS — S12000A Unspecified displaced fracture of first cervical vertebra, initial encounter for closed fracture: Secondary | ICD-10-CM | POA: Diagnosis not present

## 2022-02-11 DIAGNOSIS — I480 Paroxysmal atrial fibrillation: Secondary | ICD-10-CM | POA: Diagnosis not present

## 2022-02-11 DIAGNOSIS — Z992 Dependence on renal dialysis: Secondary | ICD-10-CM | POA: Diagnosis not present

## 2022-02-11 DIAGNOSIS — K219 Gastro-esophageal reflux disease without esophagitis: Secondary | ICD-10-CM | POA: Diagnosis not present

## 2022-02-15 DIAGNOSIS — Z992 Dependence on renal dialysis: Secondary | ICD-10-CM | POA: Diagnosis not present

## 2022-02-15 DIAGNOSIS — N186 End stage renal disease: Secondary | ICD-10-CM | POA: Diagnosis not present

## 2022-02-15 DIAGNOSIS — N2581 Secondary hyperparathyroidism of renal origin: Secondary | ICD-10-CM | POA: Diagnosis not present

## 2022-02-17 DIAGNOSIS — N186 End stage renal disease: Secondary | ICD-10-CM | POA: Diagnosis not present

## 2022-02-17 DIAGNOSIS — N2581 Secondary hyperparathyroidism of renal origin: Secondary | ICD-10-CM | POA: Diagnosis not present

## 2022-02-17 DIAGNOSIS — Z992 Dependence on renal dialysis: Secondary | ICD-10-CM | POA: Diagnosis not present

## 2022-02-18 DIAGNOSIS — Z452 Encounter for adjustment and management of vascular access device: Secondary | ICD-10-CM | POA: Diagnosis not present

## 2022-02-18 DIAGNOSIS — N186 End stage renal disease: Secondary | ICD-10-CM | POA: Diagnosis not present

## 2022-02-18 DIAGNOSIS — Z992 Dependence on renal dialysis: Secondary | ICD-10-CM | POA: Diagnosis not present

## 2022-02-20 ENCOUNTER — Emergency Department (HOSPITAL_COMMUNITY): Payer: Medicare Other

## 2022-02-20 ENCOUNTER — Encounter (HOSPITAL_COMMUNITY): Payer: Self-pay | Admitting: Emergency Medicine

## 2022-02-20 ENCOUNTER — Emergency Department (HOSPITAL_COMMUNITY)
Admission: EM | Admit: 2022-02-20 | Discharge: 2022-02-21 | Disposition: A | Discharge status: Skilled Nursing Facility | Payer: Medicare Other | Source: Home / Self Care | Attending: Emergency Medicine | Admitting: Emergency Medicine

## 2022-02-20 DIAGNOSIS — J189 Pneumonia, unspecified organism: Secondary | ICD-10-CM

## 2022-02-20 DIAGNOSIS — I120 Hypertensive chronic kidney disease with stage 5 chronic kidney disease or end stage renal disease: Secondary | ICD-10-CM | POA: Insufficient documentation

## 2022-02-20 DIAGNOSIS — Z79899 Other long term (current) drug therapy: Secondary | ICD-10-CM | POA: Insufficient documentation

## 2022-02-20 DIAGNOSIS — J9811 Atelectasis: Secondary | ICD-10-CM | POA: Diagnosis not present

## 2022-02-20 DIAGNOSIS — K5730 Diverticulosis of large intestine without perforation or abscess without bleeding: Secondary | ICD-10-CM | POA: Diagnosis not present

## 2022-02-20 DIAGNOSIS — I4891 Unspecified atrial fibrillation: Secondary | ICD-10-CM | POA: Insufficient documentation

## 2022-02-20 DIAGNOSIS — Z043 Encounter for examination and observation following other accident: Secondary | ICD-10-CM | POA: Diagnosis not present

## 2022-02-20 DIAGNOSIS — Z1152 Encounter for screening for COVID-19: Secondary | ICD-10-CM | POA: Insufficient documentation

## 2022-02-20 DIAGNOSIS — W19XXXA Unspecified fall, initial encounter: Secondary | ICD-10-CM | POA: Insufficient documentation

## 2022-02-20 DIAGNOSIS — K8020 Calculus of gallbladder without cholecystitis without obstruction: Secondary | ICD-10-CM | POA: Diagnosis not present

## 2022-02-20 DIAGNOSIS — A4101 Sepsis due to Methicillin susceptible Staphylococcus aureus: Secondary | ICD-10-CM | POA: Diagnosis not present

## 2022-02-20 DIAGNOSIS — K7460 Unspecified cirrhosis of liver: Secondary | ICD-10-CM | POA: Diagnosis not present

## 2022-02-20 DIAGNOSIS — J168 Pneumonia due to other specified infectious organisms: Secondary | ICD-10-CM | POA: Diagnosis not present

## 2022-02-20 DIAGNOSIS — N186 End stage renal disease: Secondary | ICD-10-CM | POA: Insufficient documentation

## 2022-02-20 DIAGNOSIS — R918 Other nonspecific abnormal finding of lung field: Secondary | ICD-10-CM | POA: Diagnosis not present

## 2022-02-20 DIAGNOSIS — R9431 Abnormal electrocardiogram [ECG] [EKG]: Secondary | ICD-10-CM | POA: Diagnosis not present

## 2022-02-20 DIAGNOSIS — I10 Essential (primary) hypertension: Secondary | ICD-10-CM | POA: Diagnosis not present

## 2022-02-20 DIAGNOSIS — M47816 Spondylosis without myelopathy or radiculopathy, lumbar region: Secondary | ICD-10-CM | POA: Diagnosis not present

## 2022-02-20 DIAGNOSIS — S50811A Abrasion of right forearm, initial encounter: Secondary | ICD-10-CM | POA: Insufficient documentation

## 2022-02-20 DIAGNOSIS — A419 Sepsis, unspecified organism: Secondary | ICD-10-CM | POA: Diagnosis not present

## 2022-02-20 DIAGNOSIS — Z992 Dependence on renal dialysis: Secondary | ICD-10-CM | POA: Insufficient documentation

## 2022-02-20 LAB — CBC WITH DIFFERENTIAL/PLATELET
Abs Immature Granulocytes: 0.06 10*3/uL (ref 0.00–0.07)
Basophils Absolute: 0 10*3/uL (ref 0.0–0.1)
Basophils Relative: 0 %
Eosinophils Absolute: 0.2 10*3/uL (ref 0.0–0.5)
Eosinophils Relative: 3 %
HCT: 26.4 % — ABNORMAL LOW (ref 39.0–52.0)
Hemoglobin: 9.3 g/dL — ABNORMAL LOW (ref 13.0–17.0)
Immature Granulocytes: 1 %
Lymphocytes Relative: 4 %
Lymphs Abs: 0.3 10*3/uL — ABNORMAL LOW (ref 0.7–4.0)
MCH: 35.8 pg — ABNORMAL HIGH (ref 26.0–34.0)
MCHC: 35.2 g/dL (ref 30.0–36.0)
MCV: 101.5 fL — ABNORMAL HIGH (ref 80.0–100.0)
Monocytes Absolute: 0.4 10*3/uL (ref 0.1–1.0)
Monocytes Relative: 7 %
Neutro Abs: 4.8 10*3/uL (ref 1.7–7.7)
Neutrophils Relative %: 85 %
Platelets: 93 10*3/uL — ABNORMAL LOW (ref 150–400)
RBC: 2.6 MIL/uL — ABNORMAL LOW (ref 4.22–5.81)
RDW: 14.8 % (ref 11.5–15.5)
WBC: 5.7 10*3/uL (ref 4.0–10.5)
nRBC: 0 % (ref 0.0–0.2)

## 2022-02-20 LAB — COMPREHENSIVE METABOLIC PANEL
ALT: 15 U/L (ref 0–44)
AST: 28 U/L (ref 15–41)
Albumin: 3 g/dL — ABNORMAL LOW (ref 3.5–5.0)
Alkaline Phosphatase: 103 U/L (ref 38–126)
Anion gap: 15 (ref 5–15)
BUN: 50 mg/dL — ABNORMAL HIGH (ref 8–23)
CO2: 20 mmol/L — ABNORMAL LOW (ref 22–32)
Calcium: 8.2 mg/dL — ABNORMAL LOW (ref 8.9–10.3)
Chloride: 94 mmol/L — ABNORMAL LOW (ref 98–111)
Creatinine, Ser: 3.69 mg/dL — ABNORMAL HIGH (ref 0.61–1.24)
GFR, Estimated: 16 mL/min — ABNORMAL LOW (ref >60)
Glucose, Bld: 123 mg/dL — ABNORMAL HIGH (ref 70–99)
Potassium: 3.5 mmol/L (ref 3.5–5.1)
Sodium: 129 mmol/L — ABNORMAL LOW (ref 135–145)
Total Bilirubin: 1.4 mg/dL — ABNORMAL HIGH (ref 0.3–1.2)
Total Protein: 5.5 g/dL — ABNORMAL LOW (ref 6.5–8.1)

## 2022-02-20 LAB — MAGNESIUM: Magnesium: 1.4 mg/dL — ABNORMAL LOW (ref 1.7–2.4)

## 2022-02-20 NOTE — ED Provider Notes (Signed)
[facility] EMERGENCY DEPARTMENT Provider Note   CSN: [id number] Arrival date & time: 02/20/22  2137     History  No chief complaint on file.   Darryl Diaz is a 81 y.o. male.  Pt is a 81 yo male with a pmhx significant for HTN, HLD, GERD, OA, a. Fib (not on thinners due to multiple falls and hx gib), sleep apnea, chf, cirrhosis, GI bleed, and ESRD on HD MWF.  Pt said he falls a lot.  Today, he was walking with his walker and her fell.  He said he has pain all over, but none of it is new.  He wears a c-collar chronically due to a C1 fx sustained on 9/15.       Home Medications Prior to Admission medications   Medication Sig Start Date End Date Taking? Authorizing Provider  amiodarone (PACERONE) 200 MG tablet TAKE 1 TABLET BY MOUTH ONCE DAILY Patient taking differently: Take 200 mg by mouth every morning. 05/17/21   Richardson Dopp T, PA-C  atorvastatin (LIPITOR) 20 MG tablet Take 1 tablet (20 mg total) by mouth daily. 03/10/20   Angiulli, Lavon Paganini, PA-C  carvedilol (COREG) 6.25 MG tablet Take 1 tablet (6.25 mg total) by mouth 2 (two) times daily. Patient taking differently: Take 12.5 mg by mouth daily. 02/11/21   Sherren Mocha, MD  cetirizine (ZYRTEC) 10 MG tablet Take 10 mg by mouth daily.    [provider]  escitalopram (LEXAPRO) 10 MG tablet Take 1 tablet (10 mg total) by mouth daily. Patient taking differently: Take 10 mg by mouth every morning. 03/10/20   Angiulli, Lavon Paganini, PA-C  ferrous sulfate 324 MG TBEC Take 324 mg by mouth every morning.    [provider]  FOLIC ACID PO Take 1 tablet by mouth every morning.    [provider]  Magnesium Oxide 400 MG CAPS Take 1 capsule (400 mg total) by mouth daily. 10/17/20   Eugenie Filler, MD  Multiple Vitamin (MULTIVITAMIN PO) Take 1 tablet by mouth every other day.    [provider]  nitroGLYCERIN (NITROSTAT) 0.4 MG SL tablet Place 1 tablet (0.4 mg total) under the tongue  every 5 (five) minutes x 3 doses as needed for chest pain. 07/28/20   Baldwin Jamaica, PA-C  pantoprazole (PROTONIX) 40 MG tablet Take 1 tablet (40 mg total) by mouth daily. Patient taking differently: Take 40 mg by mouth every morning. 03/10/20   Angiulli, Lavon Paganini, PA-C  saccharomyces boulardii (FLORASTOR) 250 MG capsule Take 1 capsule (250 mg total) by mouth 2 (two) times daily. 01/27/22   Regalado, Belkys A, MD  sodium bicarbonate 650 MG tablet Take 650 mg by mouth daily.    [provider]  tamsulosin (FLOMAX) 0.4 MG CAPS capsule Take 0.4 mg by mouth at bedtime.    [provider]  thiamine (VITAMIN B-1) 100 MG tablet Take 1 tablet (100 mg total) by mouth daily. 01/28/22   Regalado, Belkys A, MD  torsemide (DEMADEX) 20 MG tablet Take 60 mg by mouth daily.    [provider]  traZODone (DESYREL) 50 MG tablet Take 1 tablet (50 mg total) by mouth at bedtime. 11/26/21   Alma Friendly, MD      Allergies    Penicillin g sodium and Penicillins    Review of Systems   Review of Systems  Neurological:  Positive for weakness.  All other systems reviewed and are negative.   Physical Exam Updated  Vital Signs BP (!) 173/71   Pulse (!) 105   Temp 97.8 F (36.6 C)   Resp 20   SpO2 95%  Physical Exam Vitals and nursing note reviewed.  Constitutional:      Appearance: Normal appearance.  HENT:     Head: Normocephalic and atraumatic.     Right Ear: External ear normal.     Left Ear: External ear normal.     Nose: Nose normal.     Mouth/Throat:     Mouth: Mucous membranes are moist.     Pharynx: Oropharynx is clear.  Eyes:     Extraocular Movements: Extraocular movements intact.     Conjunctiva/sclera: Conjunctivae normal.     Pupils: Pupils are equal, round, and reactive to light.  Neck:     Comments: In c-collar Cardiovascular:     Rate and Rhythm: Normal rate. Rhythm irregular.     Pulses: Normal pulses.     Heart sounds: Normal heart sounds.   Pulmonary:     Effort: Pulmonary effort is normal.     Breath sounds: Normal breath sounds.  Abdominal:     General: Abdomen is flat. Bowel sounds are normal.     Palpations: Abdomen is soft.  Musculoskeletal:        General: Normal range of motion.  Skin:    General: Skin is warm.     Capillary Refill: Capillary refill takes less than 2 seconds.     Comments: Bruises all over skin.  He has a small abrasion to the right forearm.  Neurological:     General: No focal deficit present.     Mental Status: He is alert and oriented to person, place, and time.  Psychiatric:        Mood and Affect: Mood normal.        Behavior: Behavior normal.     ED Results / Procedures / Treatments   Labs (all labs ordered are listed, but only abnormal results are displayed) Labs Reviewed  COMPREHENSIVE METABOLIC PANEL  CBC WITH DIFFERENTIAL/PLATELET  URINALYSIS, ROUTINE W REFLEX MICROSCOPIC  MAGNESIUM    EKG None  Radiology CT Head Wo Contrast  Result Date: 02/20/2022 CLINICAL DATA:  Fall EXAM: CT HEAD WITHOUT CONTRAST CT CERVICAL SPINE WITHOUT CONTRAST TECHNIQUE: Multidetector CT imaging of the head and cervical spine was performed following the standard protocol without intravenous contrast. Multiplanar CT image reconstructions of the cervical spine were also generated. RADIATION DOSE REDUCTION: This exam was performed according to the departmental dose-optimization program which includes automated exposure control, adjustment of the mA and/or kV according to patient size and/or use of iterative reconstruction technique. COMPARISON:  01/31/2022 FINDINGS: Motion degraded images. CT HEAD FINDINGS Brain: No evidence of acute infarction, hemorrhage, hydrocephalus, extra-axial collection or mass lesion/mass effect. Global cortical and central atrophy. Subcortical white matter and periventricular small vessel ischemic changes. Vascular: Intracranial atherosclerosis. Skull: Normal. Negative for fracture  or focal lesion. Sinuses/Orbits: The visualized paranasal sinuses are essentially clear. The mastoid air cells are unopacified. Other: None. CT CERVICAL SPINE FINDINGS Alignment: Straightening the cervical spine, likely positional. Skull base and vertebrae: Stable fracture involving the posterior aspect of the right superior articular facet of C1 (series 3/image 13). No new/acute fracture. Soft tissues and spinal canal: No prevertebral fluid or swelling. No visible canal hematoma. Disc levels: Moderate degenerative changes of the lower cervical spine. Spinal canal is patent. Upper chest: Visualized lung apices are essentially clear. Other: Visualized thyroid is grossly unremarkable. IMPRESSION: No acute intracranial abnormality.  Atrophy with small vessel ischemic changes. Stable fracture involving the posterior aspect of the right superior articular facet of C1. No new/acute traumatic injury to the cervical spine. Electronically Signed   By: Julian Hy M.D.   On: 02/20/2022 22:48   CT Cervical Spine Wo Contrast  Result Date: 02/20/2022 CLINICAL DATA:  Fall EXAM: CT HEAD WITHOUT CONTRAST CT CERVICAL SPINE WITHOUT CONTRAST TECHNIQUE: Multidetector CT imaging of the head and cervical spine was performed following the standard protocol without intravenous contrast. Multiplanar CT image reconstructions of the cervical spine were also generated. RADIATION DOSE REDUCTION: This exam was performed according to the departmental dose-optimization program which includes automated exposure control, adjustment of the mA and/or kV according to patient size and/or use of iterative reconstruction technique. COMPARISON:  01/31/2022 FINDINGS: Motion degraded images. CT HEAD FINDINGS Brain: No evidence of acute infarction, hemorrhage, hydrocephalus, extra-axial collection or mass lesion/mass effect. Global cortical and central atrophy. Subcortical white matter and periventricular small vessel ischemic changes. Vascular:  Intracranial atherosclerosis. Skull: Normal. Negative for fracture or focal lesion. Sinuses/Orbits: The visualized paranasal sinuses are essentially clear. The mastoid air cells are unopacified. Other: None. CT CERVICAL SPINE FINDINGS Alignment: Straightening the cervical spine, likely positional. Skull base and vertebrae: Stable fracture involving the posterior aspect of the right superior articular facet of C1 (series 3/image 13). No new/acute fracture. Soft tissues and spinal canal: No prevertebral fluid or swelling. No visible canal hematoma. Disc levels: Moderate degenerative changes of the lower cervical spine. Spinal canal is patent. Upper chest: Visualized lung apices are essentially clear. Other: Visualized thyroid is grossly unremarkable. IMPRESSION: No acute intracranial abnormality. Atrophy with small vessel ischemic changes. Stable fracture involving the posterior aspect of the right superior articular facet of C1. No new/acute traumatic injury to the cervical spine. Electronically Signed   By: Julian Hy M.D.   On: 02/20/2022 22:48    Procedures Procedures    Medications Ordered in ED Medications - No data to display  ED Course/ Medical Decision Making/ A&P                           Medical Decision Making Amount and/or Complexity of Data Reviewed Labs: ordered. Radiology: ordered.   This patient presents to the ED for concern of fall, this involves an extensive number of treatment options, and is a complaint that carries with it a high risk of complications and morbidity.  The differential diagnosis includes multiple trauma   Co morbidities that complicate the patient evaluation   HTN, HLD, GERD, OA, a. Fib (not on thinners due to multiple falls and hx gib), sleep apnea, chf, cirrhosis, GI bleed, and ESRD on HD MWF   Additional history obtained:  Additional history obtained from epic chart review    Lab Tests:  I Ordered, and personally interpreted labs.  The  pertinent results include:  cbc, cmp, ua, mg   Imaging Studies ordered:  I ordered imaging studies including ct head/ct c-spine I independently visualized and interpreted imaging which showed  CT head: IMPRESSION:  No acute intracranial abnormality. Atrophy with small vessel  ischemic changes.    Stable fracture involving the posterior aspect of the right superior  articular facet of C1. No new/acute traumatic injury to the cervical  spine.  CT cervical spine: IMPRESSION:  No acute intracranial abnormality. Atrophy with small vessel  ischemic changes.    Stable fracture involving the posterior aspect of the right superior  articular facet of  C1. No new/acute traumatic injury to the cervical  spine.   I agree with the radiologist interpretation   Cardiac Monitoring:  The patient was maintained on a cardiac monitor.  I personally viewed and interpreted the cardiac monitored which showed an underlying rhythm of: nsr   Medicines ordered and prescription drug management:  I have reviewed the patients home medicines and have made adjustments as needed   Test Considered:  ct    Problem List / ED Course:  Fall:  xrays and labs pending   Reevaluation:  After the interventions noted above, I reevaluated the patient and found that they have :improved   Social Determinants of Health:  Lives in Assisted Living          Final Clinical Impression(s) / ED Diagnoses Final diagnoses:  Fall, initial encounter    Rx / DC Orders ED Discharge Orders     None         [name], MD 02/20/22 2300

## 2022-02-20 NOTE — ED Notes (Signed)
Pt comes into the ED wearing a c collar which ems states that he wears all the time , ,

## 2022-02-20 NOTE — ED Triage Notes (Signed)
Pt here from [place] with c/o fall , no thinners , no loc , no complaints

## 2022-02-21 ENCOUNTER — Encounter (HOSPITAL_COMMUNITY): Payer: Self-pay

## 2022-02-21 ENCOUNTER — Emergency Department (HOSPITAL_COMMUNITY): Payer: Medicare Other

## 2022-02-21 ENCOUNTER — Inpatient Hospital Stay (HOSPITAL_COMMUNITY)
Admission: EM | Admit: 2022-02-21 | Discharge: 2022-03-06 | DRG: 871 | Disposition: A | Discharge status: Hospice | Payer: Medicare Other | Source: Skilled Nursing Facility | Attending: Internal Medicine | Admitting: Internal Medicine

## 2022-02-21 ENCOUNTER — Other Ambulatory Visit: Payer: Self-pay

## 2022-02-21 DIAGNOSIS — Z9181 History of falling: Secondary | ICD-10-CM

## 2022-02-21 DIAGNOSIS — S12000D Unspecified displaced fracture of first cervical vertebra, subsequent encounter for fracture with routine healing: Secondary | ICD-10-CM

## 2022-02-21 DIAGNOSIS — E876 Hypokalemia: Secondary | ICD-10-CM | POA: Diagnosis present

## 2022-02-21 DIAGNOSIS — Z87891 Personal history of nicotine dependence: Secondary | ICD-10-CM

## 2022-02-21 DIAGNOSIS — A4101 Sepsis due to Methicillin susceptible Staphylococcus aureus: Principal | ICD-10-CM | POA: Diagnosis present

## 2022-02-21 DIAGNOSIS — M25552 Pain in left hip: Secondary | ICD-10-CM | POA: Diagnosis present

## 2022-02-21 DIAGNOSIS — R918 Other nonspecific abnormal finding of lung field: Secondary | ICD-10-CM | POA: Diagnosis not present

## 2022-02-21 DIAGNOSIS — G2581 Restless legs syndrome: Secondary | ICD-10-CM | POA: Diagnosis present

## 2022-02-21 DIAGNOSIS — R627 Adult failure to thrive: Secondary | ICD-10-CM | POA: Diagnosis present

## 2022-02-21 DIAGNOSIS — I10 Essential (primary) hypertension: Secondary | ICD-10-CM | POA: Diagnosis not present

## 2022-02-21 DIAGNOSIS — L03116 Cellulitis of left lower limb: Secondary | ICD-10-CM | POA: Diagnosis present

## 2022-02-21 DIAGNOSIS — K219 Gastro-esophageal reflux disease without esophagitis: Secondary | ICD-10-CM | POA: Diagnosis present

## 2022-02-21 DIAGNOSIS — Z7401 Bed confinement status: Secondary | ICD-10-CM | POA: Diagnosis not present

## 2022-02-21 DIAGNOSIS — Z89412 Acquired absence of left great toe: Secondary | ICD-10-CM

## 2022-02-21 DIAGNOSIS — I350 Nonrheumatic aortic (valve) stenosis: Secondary | ICD-10-CM | POA: Diagnosis present

## 2022-02-21 DIAGNOSIS — M4622 Osteomyelitis of vertebra, cervical region: Secondary | ICD-10-CM | POA: Diagnosis present

## 2022-02-21 DIAGNOSIS — E1169 Type 2 diabetes mellitus with other specified complication: Secondary | ICD-10-CM | POA: Diagnosis present

## 2022-02-21 DIAGNOSIS — F0394 Unspecified dementia, unspecified severity, with anxiety: Secondary | ICD-10-CM | POA: Diagnosis present

## 2022-02-21 DIAGNOSIS — I480 Paroxysmal atrial fibrillation: Secondary | ICD-10-CM | POA: Diagnosis present

## 2022-02-21 DIAGNOSIS — I77810 Thoracic aortic ectasia: Secondary | ICD-10-CM | POA: Diagnosis present

## 2022-02-21 DIAGNOSIS — L0390 Cellulitis, unspecified: Secondary | ICD-10-CM | POA: Diagnosis present

## 2022-02-21 DIAGNOSIS — R296 Repeated falls: Secondary | ICD-10-CM | POA: Diagnosis present

## 2022-02-21 DIAGNOSIS — D631 Anemia in chronic kidney disease: Secondary | ICD-10-CM | POA: Diagnosis present

## 2022-02-21 DIAGNOSIS — Z8261 Family history of arthritis: Secondary | ICD-10-CM

## 2022-02-21 DIAGNOSIS — R5381 Other malaise: Secondary | ICD-10-CM | POA: Diagnosis present

## 2022-02-21 DIAGNOSIS — K760 Fatty (change of) liver, not elsewhere classified: Secondary | ICD-10-CM | POA: Diagnosis present

## 2022-02-21 DIAGNOSIS — M47816 Spondylosis without myelopathy or radiculopathy, lumbar region: Secondary | ICD-10-CM | POA: Diagnosis present

## 2022-02-21 DIAGNOSIS — I5033 Acute on chronic diastolic (congestive) heart failure: Secondary | ICD-10-CM | POA: Diagnosis present

## 2022-02-21 DIAGNOSIS — R9431 Abnormal electrocardiogram [ECG] [EKG]: Secondary | ICD-10-CM | POA: Diagnosis present

## 2022-02-21 DIAGNOSIS — I2510 Atherosclerotic heart disease of native coronary artery without angina pectoris: Secondary | ICD-10-CM | POA: Diagnosis present

## 2022-02-21 DIAGNOSIS — I2489 Other forms of acute ischemic heart disease: Secondary | ICD-10-CM | POA: Diagnosis present

## 2022-02-21 DIAGNOSIS — D696 Thrombocytopenia, unspecified: Secondary | ICD-10-CM | POA: Diagnosis present

## 2022-02-21 DIAGNOSIS — N186 End stage renal disease: Secondary | ICD-10-CM | POA: Diagnosis not present

## 2022-02-21 DIAGNOSIS — W19XXXD Unspecified fall, subsequent encounter: Secondary | ICD-10-CM | POA: Diagnosis present

## 2022-02-21 DIAGNOSIS — E871 Hypo-osmolality and hyponatremia: Secondary | ICD-10-CM | POA: Diagnosis present

## 2022-02-21 DIAGNOSIS — Z7901 Long term (current) use of anticoagulants: Secondary | ICD-10-CM

## 2022-02-21 DIAGNOSIS — I4819 Other persistent atrial fibrillation: Secondary | ICD-10-CM | POA: Diagnosis present

## 2022-02-21 DIAGNOSIS — Z86010 Personal history of colonic polyps: Secondary | ICD-10-CM

## 2022-02-21 DIAGNOSIS — J168 Pneumonia due to other specified infectious organisms: Secondary | ICD-10-CM | POA: Diagnosis not present

## 2022-02-21 DIAGNOSIS — M4642 Discitis, unspecified, cervical region: Secondary | ICD-10-CM | POA: Diagnosis present

## 2022-02-21 DIAGNOSIS — M1990 Unspecified osteoarthritis, unspecified site: Secondary | ICD-10-CM | POA: Diagnosis present

## 2022-02-21 DIAGNOSIS — G4733 Obstructive sleep apnea (adult) (pediatric): Secondary | ICD-10-CM | POA: Diagnosis present

## 2022-02-21 DIAGNOSIS — E1122 Type 2 diabetes mellitus with diabetic chronic kidney disease: Secondary | ICD-10-CM | POA: Diagnosis present

## 2022-02-21 DIAGNOSIS — N2581 Secondary hyperparathyroidism of renal origin: Secondary | ICD-10-CM | POA: Diagnosis present

## 2022-02-21 DIAGNOSIS — I132 Hypertensive heart and chronic kidney disease with heart failure and with stage 5 chronic kidney disease, or end stage renal disease: Secondary | ICD-10-CM | POA: Diagnosis present

## 2022-02-21 DIAGNOSIS — A419 Sepsis, unspecified organism: Secondary | ICD-10-CM | POA: Diagnosis present

## 2022-02-21 DIAGNOSIS — Z992 Dependence on renal dialysis: Secondary | ICD-10-CM | POA: Diagnosis not present

## 2022-02-21 DIAGNOSIS — R32 Unspecified urinary incontinence: Secondary | ICD-10-CM | POA: Diagnosis present

## 2022-02-21 DIAGNOSIS — K7030 Alcoholic cirrhosis of liver without ascites: Secondary | ICD-10-CM | POA: Diagnosis present

## 2022-02-21 DIAGNOSIS — Z91199 Patient's noncompliance with other medical treatment and regimen due to unspecified reason: Secondary | ICD-10-CM

## 2022-02-21 DIAGNOSIS — Z66 Do not resuscitate: Secondary | ICD-10-CM | POA: Diagnosis present

## 2022-02-21 DIAGNOSIS — F1020 Alcohol dependence, uncomplicated: Secondary | ICD-10-CM | POA: Diagnosis present

## 2022-02-21 DIAGNOSIS — Z8249 Family history of ischemic heart disease and other diseases of the circulatory system: Secondary | ICD-10-CM

## 2022-02-21 DIAGNOSIS — K8020 Calculus of gallbladder without cholecystitis without obstruction: Secondary | ICD-10-CM | POA: Diagnosis not present

## 2022-02-21 DIAGNOSIS — M4620 Osteomyelitis of vertebra, site unspecified: Secondary | ICD-10-CM

## 2022-02-21 DIAGNOSIS — B9561 Methicillin susceptible Staphylococcus aureus infection as the cause of diseases classified elsewhere: Secondary | ICD-10-CM | POA: Diagnosis present

## 2022-02-21 DIAGNOSIS — Z515 Encounter for palliative care: Secondary | ICD-10-CM

## 2022-02-21 DIAGNOSIS — M86172 Other acute osteomyelitis, left ankle and foot: Secondary | ICD-10-CM | POA: Diagnosis present

## 2022-02-21 DIAGNOSIS — K5730 Diverticulosis of large intestine without perforation or abscess without bleeding: Secondary | ICD-10-CM | POA: Diagnosis not present

## 2022-02-21 DIAGNOSIS — R0689 Other abnormalities of breathing: Secondary | ICD-10-CM | POA: Diagnosis not present

## 2022-02-21 DIAGNOSIS — Z8673 Personal history of transient ischemic attack (TIA), and cerebral infarction without residual deficits: Secondary | ICD-10-CM

## 2022-02-21 DIAGNOSIS — Z79899 Other long term (current) drug therapy: Secondary | ICD-10-CM

## 2022-02-21 DIAGNOSIS — Z1152 Encounter for screening for COVID-19: Secondary | ICD-10-CM

## 2022-02-21 DIAGNOSIS — Z8674 Personal history of sudden cardiac arrest: Secondary | ICD-10-CM

## 2022-02-21 DIAGNOSIS — R7881 Bacteremia: Secondary | ICD-10-CM

## 2022-02-21 DIAGNOSIS — R000 Tachycardia, unspecified: Secondary | ICD-10-CM | POA: Diagnosis not present

## 2022-02-21 DIAGNOSIS — J9811 Atelectasis: Secondary | ICD-10-CM | POA: Diagnosis present

## 2022-02-21 DIAGNOSIS — R197 Diarrhea, unspecified: Secondary | ICD-10-CM | POA: Diagnosis present

## 2022-02-21 DIAGNOSIS — Z823 Family history of stroke: Secondary | ICD-10-CM

## 2022-02-21 DIAGNOSIS — J189 Pneumonia, unspecified organism: Secondary | ICD-10-CM | POA: Diagnosis present

## 2022-02-21 DIAGNOSIS — Z880 Allergy status to penicillin: Secondary | ICD-10-CM

## 2022-02-21 DIAGNOSIS — R7989 Other specified abnormal findings of blood chemistry: Secondary | ICD-10-CM | POA: Diagnosis present

## 2022-02-21 DIAGNOSIS — I959 Hypotension, unspecified: Secondary | ICD-10-CM | POA: Diagnosis not present

## 2022-02-21 DIAGNOSIS — K7460 Unspecified cirrhosis of liver: Secondary | ICD-10-CM | POA: Diagnosis not present

## 2022-02-21 DIAGNOSIS — Z8719 Personal history of other diseases of the digestive system: Secondary | ICD-10-CM

## 2022-02-21 DIAGNOSIS — R410 Disorientation, unspecified: Secondary | ICD-10-CM | POA: Diagnosis not present

## 2022-02-21 DIAGNOSIS — E785 Hyperlipidemia, unspecified: Secondary | ICD-10-CM | POA: Diagnosis present

## 2022-02-21 DIAGNOSIS — R531 Weakness: Secondary | ICD-10-CM | POA: Diagnosis not present

## 2022-02-21 DIAGNOSIS — M48061 Spinal stenosis, lumbar region without neurogenic claudication: Secondary | ICD-10-CM | POA: Diagnosis present

## 2022-02-21 DIAGNOSIS — Z96643 Presence of artificial hip joint, bilateral: Secondary | ICD-10-CM | POA: Diagnosis present

## 2022-02-21 DIAGNOSIS — R0902 Hypoxemia: Secondary | ICD-10-CM | POA: Diagnosis not present

## 2022-02-21 LAB — BLOOD CULTURE ID PANEL (REFLEXED) - BCID2

## 2022-02-21 LAB — COMPREHENSIVE METABOLIC PANEL
ALT: 17 U/L (ref 0–44)
AST: 40 U/L (ref 15–41)
Albumin: 2.6 g/dL — ABNORMAL LOW (ref 3.5–5.0)
Alkaline Phosphatase: 76 U/L (ref 38–126)
Anion gap: 14 (ref 5–15)
BUN: 20 mg/dL (ref 8–23)
CO2: 25 mmol/L (ref 22–32)
Calcium: 8.2 mg/dL — ABNORMAL LOW (ref 8.9–10.3)
Chloride: 98 mmol/L (ref 98–111)
Creatinine, Ser: 2.05 mg/dL — ABNORMAL HIGH (ref 0.61–1.24)
GFR, Estimated: 32 mL/min — ABNORMAL LOW (ref >60)
Glucose, Bld: 92 mg/dL (ref 70–99)
Potassium: 3.2 mmol/L — ABNORMAL LOW (ref 3.5–5.1)
Sodium: 137 mmol/L (ref 135–145)
Total Bilirubin: 1.6 mg/dL — ABNORMAL HIGH (ref 0.3–1.2)
Total Protein: 5.2 g/dL — ABNORMAL LOW (ref 6.5–8.1)

## 2022-02-21 LAB — PROTIME-INR
INR: 1.4 — ABNORMAL HIGH (ref 0.8–1.2)
Prothrombin Time: 17.3 seconds — ABNORMAL HIGH (ref 11.4–15.2)

## 2022-02-21 LAB — CBC WITH DIFFERENTIAL/PLATELET
Abs Immature Granulocytes: 0.28 10*3/uL — ABNORMAL HIGH (ref 0.00–0.07)
Basophils Absolute: 0 10*3/uL (ref 0.0–0.1)
Basophils Relative: 0 %
Eosinophils Absolute: 0 10*3/uL (ref 0.0–0.5)
Eosinophils Relative: 0 %
HCT: 23.9 % — ABNORMAL LOW (ref 39.0–52.0)
Hemoglobin: 8.4 g/dL — ABNORMAL LOW (ref 13.0–17.0)
Immature Granulocytes: 4 %
Lymphocytes Relative: 3 %
Lymphs Abs: 0.2 10*3/uL — ABNORMAL LOW (ref 0.7–4.0)
MCH: 35.4 pg — ABNORMAL HIGH (ref 26.0–34.0)
MCHC: 35.1 g/dL (ref 30.0–36.0)
MCV: 100.8 fL — ABNORMAL HIGH (ref 80.0–100.0)
Monocytes Absolute: 0.6 10*3/uL (ref 0.1–1.0)
Monocytes Relative: 7 %
Neutro Abs: 6.8 10*3/uL (ref 1.7–7.7)
Neutrophils Relative %: 86 %
Platelets: 83 10*3/uL — ABNORMAL LOW (ref 150–400)
RBC: 2.37 MIL/uL — ABNORMAL LOW (ref 4.22–5.81)
RDW: 14.6 % (ref 11.5–15.5)
WBC: 7.9 10*3/uL (ref 4.0–10.5)
nRBC: 0 % (ref 0.0–0.2)

## 2022-02-21 LAB — LACTIC ACID, PLASMA
Lactic Acid, Venous: 2.8 mmol/L (ref 0.5–1.9)
Lactic Acid, Venous: 2 mmol/L (ref 0.5–1.9)

## 2022-02-21 LAB — TROPONIN I (HIGH SENSITIVITY)
Troponin I (High Sensitivity): 375 ng/L (ref <18)
Troponin I (High Sensitivity): 346 ng/L (ref <18)

## 2022-02-21 LAB — MAGNESIUM: Magnesium: 1.7 mg/dL (ref 1.7–2.4)

## 2022-02-21 LAB — SARS CORONAVIRUS 2 BY RT PCR: SARS Coronavirus 2 by RT PCR: NEGATIVE

## 2022-02-21 MED ORDER — ACETAMINOPHEN 500 MG PO TABS
1000.0000 mg | ORAL_TABLET | Freq: Once | ORAL | Status: AC
  Administered 2022-02-21: 1000 mg via ORAL
  Filled 2022-02-21: qty 2

## 2022-02-21 MED ORDER — VANCOMYCIN HCL 1500 MG/300ML IV SOLN
1500.0000 mg | Freq: Once | INTRAVENOUS | Status: AC
  Administered 2022-02-22: 1500 mg via INTRAVENOUS
  Filled 2022-02-21: qty 300

## 2022-02-21 MED ORDER — SODIUM CHLORIDE 0.9 % IV SOLN
2.0000 g | Freq: Once | INTRAVENOUS | Status: AC
  Administered 2022-02-21: 2 g via INTRAVENOUS
  Filled 2022-02-21: qty 12.5

## 2022-02-21 MED ORDER — SODIUM CHLORIDE 0.9 % IV SOLN: 2.0000 g | Freq: Two times a day (BID) | INTRAVENOUS | Status: DC

## 2022-02-21 MED ORDER — MAGNESIUM SULFATE 2 GM/50ML IV SOLN
2.0000 g | Freq: Once | INTRAVENOUS | Status: AC
  Administered 2022-02-21: 2 g via INTRAVENOUS
  Filled 2022-02-21: qty 50

## 2022-02-21 MED ORDER — LACTATED RINGERS IV BOLUS
500.0000 mL | Freq: Once | INTRAVENOUS | Status: AC
  Administered 2022-02-21: 500 mL via INTRAVENOUS

## 2022-02-21 MED ORDER — DOXYCYCLINE HYCLATE 100 MG PO CAPS: 100.0000 mg | ORAL_CAPSULE | Freq: Two times a day (BID) | ORAL | 0 refills | Status: DC

## 2022-02-21 MED ORDER — LORAZEPAM 2 MG/ML IJ SOLN
1.0000 mg | Freq: Once | INTRAMUSCULAR | Status: AC
  Administered 2022-02-21: 1 mg via INTRAVENOUS
  Filled 2022-02-21: qty 1

## 2022-02-21 MED ORDER — LACTATED RINGERS IV BOLUS (SEPSIS)
500.0000 mL | Freq: Once | INTRAVENOUS | Status: AC
  Administered 2022-02-21: 500 mL via INTRAVENOUS

## 2022-02-21 MED ORDER — VANCOMYCIN HCL IN DEXTROSE 1-5 GM/200ML-% IV SOLN
1000.0000 mg | Freq: Once | INTRAVENOUS | Status: DC
  Filled 2022-02-21: qty 200

## 2022-02-21 MED ORDER — DOXYCYCLINE HYCLATE 100 MG PO TABS
100.0000 mg | ORAL_TABLET | Freq: Once | ORAL | Status: AC
  Administered 2022-02-21: 100 mg via ORAL
  Filled 2022-02-21: qty 1

## 2022-02-21 MED ORDER — SODIUM CHLORIDE 0.9 % IV BOLUS
1000.0000 mL | Freq: Once | INTRAVENOUS | Status: AC
  Administered 2022-02-21: 1000 mL via INTRAVENOUS

## 2022-02-21 MED ORDER — LACTATED RINGERS IV SOLN: INTRAVENOUS | Status: DC

## 2022-02-21 MED ORDER — VANCOMYCIN HCL 1500 MG/300ML IV SOLN: 1500.0000 mg | INTRAVENOUS | Status: DC

## 2022-02-21 NOTE — Progress Notes (Signed)
PHARMACY - PHYSICIAN COMMUNICATION CRITICAL VALUE ALERT - BLOOD CULTURE IDENTIFICATION (BCID)  Darryl Diaz is an 81 y.o. male who presented to [facility] on 02/21/2022 with a chief complaint of pain all over  Assessment:  1/2 blood cultures growing staph aureus - no mechanisms of resistance  Most likely a contaminant  Current antibiotics: None  Changes to prescribed antibiotics recommended:  No changes recommended.  Results for orders placed or performed during the hospital encounter of 02/20/22  Blood Culture ID Panel (Reflexed) (Collected: 02/21/2022  4:07 AM)  Result Value Ref Range   Enterococcus faecalis NOT DETECTED NOT DETECTED   Enterococcus Faecium NOT DETECTED NOT DETECTED   Listeria monocytogenes NOT DETECTED NOT DETECTED   Staphylococcus species DETECTED (A) NOT DETECTED   Staphylococcus aureus (BCID) DETECTED (A) NOT DETECTED   Staphylococcus epidermidis NOT DETECTED NOT DETECTED   Staphylococcus lugdunensis NOT DETECTED NOT DETECTED   Streptococcus species NOT DETECTED NOT DETECTED   Streptococcus agalactiae NOT DETECTED NOT DETECTED   Streptococcus pneumoniae NOT DETECTED NOT DETECTED   Streptococcus pyogenes NOT DETECTED NOT DETECTED   A.calcoaceticus-baumannii NOT DETECTED NOT DETECTED   Bacteroides fragilis NOT DETECTED NOT DETECTED   Enterobacterales NOT DETECTED NOT DETECTED   Enterobacter cloacae complex NOT DETECTED NOT DETECTED   Escherichia coli NOT DETECTED NOT DETECTED   Klebsiella aerogenes NOT DETECTED NOT DETECTED   Klebsiella oxytoca NOT DETECTED NOT DETECTED   Klebsiella pneumoniae NOT DETECTED NOT DETECTED   Proteus species NOT DETECTED NOT DETECTED   Salmonella species NOT DETECTED NOT DETECTED   Serratia marcescens NOT DETECTED NOT DETECTED   Haemophilus influenzae NOT DETECTED NOT DETECTED   Neisseria meningitidis NOT DETECTED NOT DETECTED   Pseudomonas aeruginosa NOT DETECTED NOT DETECTED   Stenotrophomonas maltophilia NOT DETECTED  NOT DETECTED   Candida albicans NOT DETECTED NOT DETECTED   Candida auris NOT DETECTED NOT DETECTED   Candida glabrata NOT DETECTED NOT DETECTED   Candida krusei NOT DETECTED NOT DETECTED   Candida parapsilosis NOT DETECTED NOT DETECTED   Candida tropicalis NOT DETECTED NOT DETECTED   Cryptococcus neoformans/gattii NOT DETECTED NOT DETECTED   Meth resistant mecA/C and MREJ NOT DETECTED NOT DETECTED    [name] 02/21/2022  8:27 PM

## 2022-02-21 NOTE — ED Triage Notes (Signed)
Generalized weakness and diarrhea beginning yesterday. No appetite, poor po intake. Admits to dysuria for ~4 days.   Arrives EMS from [facility]. Seen for recurrent falls. C-collar in place from previous encounter for cervical spine fracture.   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 Left arm restriction.

## 2022-02-21 NOTE — ED Provider Notes (Signed)
[place] [facility] EMERGENCY DEPARTMENT Provider Note   CSN: [id number] Arrival date & time: 02/21/22  1948     History {Add pertinent medical, surgical, social history, OB history to HPI:1} Chief Complaint  Patient presents with   Weakness    Darryl Diaz is a 81 y.o. male.   Weakness Patient presents for generalized weakness.  Medical history includes***.  He was seen in the emergency department last night following a fall.  CT scan showed concern for pneumonia.  He did have a fever.  He was started on doxycycline.***.     Home Medications Prior to Admission medications   Medication Sig Start Date End Date Taking? Authorizing Provider  amiodarone (PACERONE) 200 MG tablet TAKE 1 TABLET BY MOUTH ONCE DAILY Patient taking differently: Take 200 mg by mouth every morning. 05/17/21   Richardson Dopp T, PA-C  atorvastatin (LIPITOR) 20 MG tablet Take 1 tablet (20 mg total) by mouth daily. 03/10/20   Angiulli, Lavon Paganini, PA-C  carvedilol (COREG) 6.25 MG tablet Take 1 tablet (6.25 mg total) by mouth 2 (two) times daily. Patient taking differently: Take 12.5 mg by mouth daily. 02/11/21   Sherren Mocha, MD  cetirizine (ZYRTEC) 10 MG tablet Take 10 mg by mouth daily.    [provider]  doxycycline (VIBRAMYCIN) 100 MG capsule Take 1 capsule (100 mg total) by mouth 2 (two) times daily. One po bid x 7 days 02/21/22   Mesner, Corene Cornea, MD  escitalopram (LEXAPRO) 10 MG tablet Take 1 tablet (10 mg total) by mouth daily. Patient taking differently: Take 10 mg by mouth every morning. 03/10/20   Angiulli, Lavon Paganini, PA-C  ferrous sulfate 324 MG TBEC Take 324 mg by mouth every morning.    [provider]  FOLIC ACID PO Take 1 tablet by mouth every morning.    [provider]  Magnesium Oxide 400 MG CAPS Take 1 capsule (400 mg total) by mouth daily. 10/17/20   Eugenie Filler, MD  Multiple Vitamin (MULTIVITAMIN PO) Take 1 tablet by mouth every other day.     [provider]  nitroGLYCERIN (NITROSTAT) 0.4 MG SL tablet Place 1 tablet (0.4 mg total) under the tongue every 5 (five) minutes x 3 doses as needed for chest pain. 07/28/20   Baldwin Jamaica, PA-C  pantoprazole (PROTONIX) 40 MG tablet Take 1 tablet (40 mg total) by mouth daily. Patient taking differently: Take 40 mg by mouth every morning. 03/10/20   Angiulli, Lavon Paganini, PA-C  saccharomyces boulardii (FLORASTOR) 250 MG capsule Take 1 capsule (250 mg total) by mouth 2 (two) times daily. 01/27/22   Regalado, Belkys A, MD  sodium bicarbonate 650 MG tablet Take 650 mg by mouth daily.    [provider]  tamsulosin (FLOMAX) 0.4 MG CAPS capsule Take 0.4 mg by mouth at bedtime.    [provider]  thiamine (VITAMIN B-1) 100 MG tablet Take 1 tablet (100 mg total) by mouth daily. 01/28/22   Regalado, Belkys A, MD  torsemide (DEMADEX) 20 MG tablet Take 60 mg by mouth daily.    [provider]  traZODone (DESYREL) 50 MG tablet Take 1 tablet (50 mg total) by mouth at bedtime. 11/26/21   Alma Friendly, MD      Allergies    Penicillin g sodium and Penicillins    Review of Systems   Review of Systems  Neurological:  Positive for weakness.    Physical Exam Updated Vital Signs BP (!) 131/57 (BP  Location: Right Arm)   Pulse 91   Temp 98.1 F (36.7 C) (Oral)   Resp 16   Ht 6' (1.829 m)   Wt 77.6 kg   SpO2 98% Comment: Simultaneous filing. User may not have seen previous data.  BMI 23.19 kg/m  Physical Exam  ED Results / Procedures / Treatments   Labs (all labs ordered are listed, but only abnormal results are displayed) Labs Reviewed - No data to display  EKG None  Radiology CT CHEST ABDOMEN PELVIS WO CONTRAST  Result Date: 02/21/2022 CLINICAL DATA:  81 year old male with history of unintended weight loss and fever. EXAM: CT CHEST, ABDOMEN AND PELVIS WITHOUT CONTRAST TECHNIQUE: Multidetector CT imaging of the chest, abdomen and pelvis was performed  following the standard protocol without IV contrast. RADIATION DOSE REDUCTION: This exam was performed according to the departmental dose-optimization program which includes automated exposure control, adjustment of the mA and/or kV according to patient size and/or use of iterative reconstruction technique. COMPARISON:  CT of the abdomen and pelvis 11/19/2021. No prior chest CT. FINDINGS: CT CHEST FINDINGS Cardiovascular: Heart size is normal. There is no significant pericardial fluid, thickening or pericardial calcification. There is aortic atherosclerosis, as well as atherosclerosis of the great vessels of the mediastinum and the coronary arteries, including calcified atherosclerotic plaque in the left main, left anterior descending, left circumflex and right coronary arteries. Mild calcifications of the mitral annulus. Moderate calcifications of the aortic valve. Mediastinum/Nodes: No pathologically enlarged mediastinal or hilar lymph nodes. Please note that accurate exclusion of hilar adenopathy is limited on noncontrast CT scans. Esophagus is unremarkable in appearance. No axillary lymphadenopathy. Lungs/Pleura: Trace bilateral pleural effusions lying dependently with minimal passive subsegmental atelectasis in the lower lobes of the lungs bilaterally. There is some very mild patchy areas of ground-glass attenuation in the right upper lobe, nonspecific, but potentially of infectious or inflammatory etiology. No confluent consolidative airspace disease. No definite suspicious appearing pulmonary nodules or masses are noted. Musculoskeletal: Multiple old healed right-sided rib fractures are incidentally noted. There are no aggressive appearing lytic or blastic lesions noted in the visualized portions of the skeleton. CT ABDOMEN PELVIS FINDINGS Hepatobiliary: Liver has a shrunken appearance and nodular contour, indicative of advanced cirrhosis. No discrete cystic or solid hepatic lesions are confidently identified  on today's noncontrast CT examination. Multiple tiny calcified gallstones lie dependently in the gallbladder. Gallbladder is moderately distended. Gallbladder wall does not appear thickened or edematous. No definite pericholecystic fluid. Pancreas: No definite pancreatic mass or peripancreatic fluid collections or inflammatory changes are noted on today's noncontrast CT examination. Spleen: Spleen appears mildly enlarged measuring 11.5 x 7.7 x 12.1 cm (estimated splenic volume of 536 mL) . Adrenals/Urinary Tract: Mild bilateral renal atrophy. In the lower pole of the left kidney there is a partially exophytic low-attenuation 1.7 cm lesion (axial image 78 of series 3) which is incompletely characterized on today's noncontrast CT examination, but similar to prior studies and statistically likely a small cysts (no imaging follow-up is recommended). Unenhanced appearance of the right kidney and bilateral adrenal glands is otherwise unremarkable. No hydroureteronephrosis. Urinary bladder is largely obscured by beam hardening artifact from the patient's bilateral hip arthroplasties. Stomach/Bowel: Unenhanced appearance of the stomach is normal. There is no pathologic dilatation of small bowel or colon. A few scattered colonic diverticuli are noted, without definite focal surrounding inflammatory changes to indicate an acute diverticulitis at this time. Normal appendix. Vascular/Lymphatic: Atherosclerotic calcifications throughout the abdominal aorta and pelvic vasculature. Portal vein appears mildly  dilated measuring 17 mm in the porta hepatis. No lymphadenopathy noted in the abdomen or pelvis. Reproductive: Prostate gland and seminal vesicles are obscured by beam hardening artifact. Other: No significant volume of ascites.  No pneumoperitoneum. Musculoskeletal: There are no aggressive appearing lytic or blastic lesions noted in the visualized portions of the skeleton. 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 IMPRESSION: 1. No definite findings in the chest, abdomen or pelvis to account for the patient's unexplained weight loss. There are very subtle areas of mild ground-glass attenuation in the right upper lobe which are nonspecific, but potentially of infectious or inflammatory etiology. 2. Trace bilateral pleural effusions lying dependently. 3. Cholelithiasis without evidence of acute cholecystitis. 4. 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 5. Colonic diverticulosis without evidence of acute diverticulitis at this time. 6. Aortic atherosclerosis, in addition to left main and three-vessel coronary artery disease. 7. There are calcifications of the aortic valve and mitral annulus. Echocardiographic correlation for evaluation of potential valvular dysfunction may be warranted if clinically indicated. 8. Additional incidental findings, as above. Electronically Signed   By: Vinnie Langton M.D.   On: 02/21/2022 06:26   DG Chest 2 View  Result Date: 02/20/2022 CLINICAL DATA:  Fall. EXAM: CHEST - 2 VIEW COMPARISON:  Chest x-ray 01/25/2022 FINDINGS: The heart size and mediastinal contours are within normal limits. There is minimal atelectasis in the lung bases. Lung volumes are low. Visualized skeletal structures are unremarkable. IMPRESSION: No active cardiopulmonary disease. Electronically Signed   By: Ronney Asters M.D.   On: 02/20/2022 23:07   DG Pelvis Portable  Result Date: 02/20/2022 CLINICAL DATA:  Fall. EXAM: PORTABLE PELVIS 1-2 VIEWS COMPARISON:  06/05/2021. FINDINGS: There is no evidence of pelvic fracture or diastasis. Bilateral hip arthroplasty changes are noted. There is no evidence of hardware loosening. Degenerative changes are noted in the lower lumbar spine. IMPRESSION: No acute fracture or dislocation. Electronically Signed   By: Brett Fairy M.D.   On: 02/20/2022 23:05   CT Head  Wo Contrast  Result Date: 02/20/2022 CLINICAL DATA:  Fall EXAM: CT HEAD WITHOUT CONTRAST CT CERVICAL SPINE WITHOUT CONTRAST TECHNIQUE: Multidetector CT imaging of the head and cervical spine was performed following the standard protocol without intravenous contrast. Multiplanar CT image reconstructions of the cervical spine were also generated. RADIATION DOSE REDUCTION: This exam was performed according to the departmental dose-optimization program which includes automated exposure control, adjustment of the mA and/or kV according to patient size and/or use of iterative reconstruction technique. COMPARISON:  01/31/2022 FINDINGS: Motion degraded images. CT HEAD FINDINGS Brain: No evidence of acute infarction, hemorrhage, hydrocephalus, extra-axial collection or mass lesion/mass effect. Global cortical and central atrophy. Subcortical white matter and periventricular small vessel ischemic changes. Vascular: Intracranial atherosclerosis. Skull: Normal. Negative for fracture or focal lesion. Sinuses/Orbits: The visualized paranasal sinuses are essentially clear. The mastoid air cells are unopacified. Other: None. CT CERVICAL SPINE FINDINGS Alignment: Straightening the cervical spine, likely positional. Skull base and vertebrae: Stable fracture involving the posterior aspect of the right superior articular facet of C1 (series 3/image 13). No new/acute fracture. Soft tissues and spinal canal: No prevertebral fluid or swelling. No visible canal hematoma. Disc levels: Moderate degenerative changes of the lower cervical spine. Spinal canal is patent. Upper chest: Visualized lung apices are essentially clear. Other: Visualized thyroid is grossly unremarkable. IMPRESSION: No acute intracranial abnormality. Atrophy with small vessel ischemic changes. Stable fracture involving the posterior  aspect of the right superior articular facet of C1. No new/acute traumatic injury to the cervical spine. Electronically Signed   By:  Julian Hy M.D.   On: 02/20/2022 22:48   CT Cervical Spine Wo Contrast  Result Date: 02/20/2022 CLINICAL DATA:  Fall EXAM: CT HEAD WITHOUT CONTRAST CT CERVICAL SPINE WITHOUT CONTRAST TECHNIQUE: Multidetector CT imaging of the head and cervical spine was performed following the standard protocol without intravenous contrast. Multiplanar CT image reconstructions of the cervical spine were also generated. RADIATION DOSE REDUCTION: This exam was performed according to the departmental dose-optimization program which includes automated exposure control, adjustment of the mA and/or kV according to patient size and/or use of iterative reconstruction technique. COMPARISON:  01/31/2022 FINDINGS: Motion degraded images. CT HEAD FINDINGS Brain: No evidence of acute infarction, hemorrhage, hydrocephalus, extra-axial collection or mass lesion/mass effect. Global cortical and central atrophy. Subcortical white matter and periventricular small vessel ischemic changes. Vascular: Intracranial atherosclerosis. Skull: Normal. Negative for fracture or focal lesion. Sinuses/Orbits: The visualized paranasal sinuses are essentially clear. The mastoid air cells are unopacified. Other: None. CT CERVICAL SPINE FINDINGS Alignment: Straightening the cervical spine, likely positional. Skull base and vertebrae: Stable fracture involving the posterior aspect of the right superior articular facet of C1 (series 3/image 13). No new/acute fracture. Soft tissues and spinal canal: No prevertebral fluid or swelling. No visible canal hematoma. Disc levels: Moderate degenerative changes of the lower cervical spine. Spinal canal is patent. Upper chest: Visualized lung apices are essentially clear. Other: Visualized thyroid is grossly unremarkable. IMPRESSION: No acute intracranial abnormality. Atrophy with small vessel ischemic changes. Stable fracture involving the posterior aspect of the right superior articular facet of C1. No new/acute  traumatic injury to the cervical spine. Electronically Signed   By: Julian Hy M.D.   On: 02/20/2022 22:48    Procedures Procedures  {Document cardiac monitor, telemetry assessment procedure when appropriate:1}  Medications Ordered in ED Medications - No data to display  ED Course/ Medical Decision Making/ A&P                           Medical Decision Making  ***  {Document critical care time when appropriate:1} {Document review of labs and clinical decision tools ie heart score, Chads2Vasc2 etc:1}  {Document your independent review of radiology images, and any outside records:1} {Document your discussion with family members, caretakers, and with consultants:1} {Document social determinants of health affecting pt's care:1} {Document your decision making why or why not admission, treatments were needed:1} Final Clinical Impression(s) / ED Diagnoses Final diagnoses:  None    Rx / DC Orders ED Discharge Orders     None

## 2022-02-21 NOTE — ED Provider Notes (Signed)
12:44 AM Assumed care from Dr. Gilford Raid, please see their note for full history, physical and decision making until this point. In brief this is a 81 y.o. year old male who presented to the ED tonight with No chief complaint on file.     Here after a fall pending labs and XR's.   Mg low, will rpelete. Rest of labs at baseline to include anemia, CKD. Slightly low sodium, will replete.   Repleted. Pending discharge and patient spiked a fever. Blood cultures, ct cap, covid test ordered. Is a dialysis patient unlikely to make considerable urine.   Fever improved. Mental baseline.   Ct with opacities concerning for pneumonia. Afebrile, normal BP, normal pulse, no tachypnea and O2 reassuring. No indication for admission. Doxy started. Stable for discharge to facility on same.   Vitals:   02/21/22 0312 02/21/22 0410  BP: 118/63   Pulse: 97   Resp: 18   Temp: (!) 101.3 F (38.5 C) 100.2 F (37.9 C)  SpO2: 95%      Discharge instructions, including strict return precautions for new or worsening symptoms, given. Patient and/or family verbalized understanding and agreement with the plan as described.   Labs, studies and imaging reviewed by myself and considered in medical decision making if ordered. Imaging interpreted by radiology.  Labs Reviewed  COMPREHENSIVE METABOLIC PANEL - Abnormal; Notable for the following components:      Result Value   Sodium 129 (*)    Chloride 94 (*)    CO2 20 (*)    Glucose, Bld 123 (*)    BUN 50 (*)    Creatinine, Ser 3.69 (*)    Calcium 8.2 (*)    Total Protein 5.5 (*)    Albumin 3.0 (*)    Total Bilirubin 1.4 (*)    GFR, Estimated 16 (*)    All other components within normal limits  CBC WITH DIFFERENTIAL/PLATELET - Abnormal; Notable for the following components:   RBC 2.60 (*)    Hemoglobin 9.3 (*)    HCT 26.4 (*)    MCV 101.5 (*)    MCH 35.8 (*)    Platelets 93 (*)    Lymphs Abs 0.3 (*)    All other components within normal limits   MAGNESIUM - Abnormal; Notable for the following components:   Magnesium 1.4 (*)    All other components within normal limits  URINALYSIS, ROUTINE W REFLEX MICROSCOPIC    DG Chest 2 View  Final Result    DG Pelvis Portable  Final Result    CT Head Wo Contrast  Final Result    CT Cervical Spine Wo Contrast  Final Result      No follow-ups on file.    [name], Corene Cornea, MD 02/21/22 [phone]

## 2022-02-21 NOTE — ED Notes (Signed)
Attempted to draw blood cultures, only able to draw one bottle at this time

## 2022-02-21 NOTE — ED Notes (Signed)
This paramedic went to medicate patient and noticed that C collar was not attached to patient. This paramedic reapplied c collar to patient.

## 2022-02-21 NOTE — Progress Notes (Signed)
Pharmacy Antibiotic Note  Darryl Diaz is a 81 y.o. male admitted on 02/21/2022 with sepsis, unknown source.  Pharmacy has been consulted for cefepime/vancomycin dosing. Patient febrile (101.3), WBC: 7.3, patient's renal function seems near baseline (unclear true Scr baseline has had wide range, lowest this year is 1.96).   Plan: Cefepime 2g Q12H.  Give IV Vancomycin 1500 ('20mg'$ /kg)  x 1 for loading dose, followed by IV Vancomycin 1500 every 48 hours for [id number].  Add MRSA PCR.  Follow culture data for de-escalation.  Monitor renal function for dose adjustments as indicated.    Height: 6' (182.9 cm) Weight: 77.6 kg (171 lb) IBW/kg (Calculated) : 77.6  Temp (24hrs), Avg:99.7 F (37.6 C), Min:98.1 F (36.7 C), Max:101.3 F (38.5 C)  Recent Labs  Lab 02/20/22 2308 02/21/22 2009  WBC 5.7 7.9  CREATININE 3.69* 2.05*  LATICACIDVEN  --  2.8*    Estimated Creatinine Clearance: 31.5 mL/min (A) (by C-G formula based on SCr of 2.05 mg/dL (H)).    Allergies  Allergen Reactions   Penicillin G Sodium Rash and Other (See Comments)     (tolerates Keflex)   Penicillins Rash    Thank you for allowing pharmacy to be a part of this patient's care.  [name] 02/21/2022 11:07 PM

## 2022-02-21 NOTE — ED Notes (Signed)
Called [facility]tar to transport patient home

## 2022-02-22 ENCOUNTER — Inpatient Hospital Stay (HOSPITAL_COMMUNITY): Payer: Medicare Other

## 2022-02-22 DIAGNOSIS — L03116 Cellulitis of left lower limb: Secondary | ICD-10-CM

## 2022-02-22 DIAGNOSIS — B9561 Methicillin susceptible Staphylococcus aureus infection as the cause of diseases classified elsewhere: Secondary | ICD-10-CM

## 2022-02-22 DIAGNOSIS — A419 Sepsis, unspecified organism: Secondary | ICD-10-CM | POA: Diagnosis not present

## 2022-02-22 DIAGNOSIS — S12000A Unspecified displaced fracture of first cervical vertebra, initial encounter for closed fracture: Secondary | ICD-10-CM | POA: Diagnosis not present

## 2022-02-22 DIAGNOSIS — N2581 Secondary hyperparathyroidism of renal origin: Secondary | ICD-10-CM | POA: Diagnosis not present

## 2022-02-22 DIAGNOSIS — M4622 Osteomyelitis of vertebra, cervical region: Secondary | ICD-10-CM | POA: Diagnosis not present

## 2022-02-22 DIAGNOSIS — I2489 Other forms of acute ischemic heart disease: Secondary | ICD-10-CM | POA: Diagnosis not present

## 2022-02-22 DIAGNOSIS — R5381 Other malaise: Secondary | ICD-10-CM

## 2022-02-22 DIAGNOSIS — I10 Essential (primary) hypertension: Secondary | ICD-10-CM

## 2022-02-22 DIAGNOSIS — G2581 Restless legs syndrome: Secondary | ICD-10-CM | POA: Diagnosis present

## 2022-02-22 DIAGNOSIS — D696 Thrombocytopenia, unspecified: Secondary | ICD-10-CM | POA: Diagnosis present

## 2022-02-22 DIAGNOSIS — R7881 Bacteremia: Secondary | ICD-10-CM

## 2022-02-22 DIAGNOSIS — M4642 Discitis, unspecified, cervical region: Secondary | ICD-10-CM | POA: Diagnosis not present

## 2022-02-22 DIAGNOSIS — W19XXXD Unspecified fall, subsequent encounter: Secondary | ICD-10-CM | POA: Diagnosis present

## 2022-02-22 DIAGNOSIS — E1169 Type 2 diabetes mellitus with other specified complication: Secondary | ICD-10-CM | POA: Diagnosis present

## 2022-02-22 DIAGNOSIS — K219 Gastro-esophageal reflux disease without esophagitis: Secondary | ICD-10-CM

## 2022-02-22 DIAGNOSIS — Z66 Do not resuscitate: Secondary | ICD-10-CM | POA: Diagnosis present

## 2022-02-22 DIAGNOSIS — F1020 Alcohol dependence, uncomplicated: Secondary | ICD-10-CM | POA: Diagnosis present

## 2022-02-22 DIAGNOSIS — N400 Enlarged prostate without lower urinary tract symptoms: Secondary | ICD-10-CM | POA: Diagnosis not present

## 2022-02-22 DIAGNOSIS — I2510 Atherosclerotic heart disease of native coronary artery without angina pectoris: Secondary | ICD-10-CM | POA: Diagnosis not present

## 2022-02-22 DIAGNOSIS — R7989 Other specified abnormal findings of blood chemistry: Secondary | ICD-10-CM

## 2022-02-22 DIAGNOSIS — R9431 Abnormal electrocardiogram [ECG] [EKG]: Secondary | ICD-10-CM | POA: Diagnosis present

## 2022-02-22 DIAGNOSIS — I350 Nonrheumatic aortic (valve) stenosis: Secondary | ICD-10-CM | POA: Diagnosis present

## 2022-02-22 DIAGNOSIS — D631 Anemia in chronic kidney disease: Secondary | ICD-10-CM | POA: Diagnosis not present

## 2022-02-22 DIAGNOSIS — I480 Paroxysmal atrial fibrillation: Secondary | ICD-10-CM

## 2022-02-22 DIAGNOSIS — M4620 Osteomyelitis of vertebra, site unspecified: Secondary | ICD-10-CM | POA: Diagnosis not present

## 2022-02-22 DIAGNOSIS — J189 Pneumonia, unspecified organism: Secondary | ICD-10-CM | POA: Diagnosis present

## 2022-02-22 DIAGNOSIS — R6520 Severe sepsis without septic shock: Secondary | ICD-10-CM

## 2022-02-22 DIAGNOSIS — I959 Hypotension, unspecified: Secondary | ICD-10-CM | POA: Diagnosis not present

## 2022-02-22 DIAGNOSIS — E871 Hypo-osmolality and hyponatremia: Secondary | ICD-10-CM | POA: Diagnosis not present

## 2022-02-22 DIAGNOSIS — K7030 Alcoholic cirrhosis of liver without ascites: Secondary | ICD-10-CM | POA: Diagnosis not present

## 2022-02-22 DIAGNOSIS — I509 Heart failure, unspecified: Secondary | ICD-10-CM | POA: Diagnosis not present

## 2022-02-22 DIAGNOSIS — M5126 Other intervertebral disc displacement, lumbar region: Secondary | ICD-10-CM | POA: Diagnosis not present

## 2022-02-22 DIAGNOSIS — R778 Other specified abnormalities of plasma proteins: Secondary | ICD-10-CM

## 2022-02-22 DIAGNOSIS — R197 Diarrhea, unspecified: Secondary | ICD-10-CM

## 2022-02-22 DIAGNOSIS — I77810 Thoracic aortic ectasia: Secondary | ICD-10-CM | POA: Diagnosis present

## 2022-02-22 DIAGNOSIS — I5032 Chronic diastolic (congestive) heart failure: Secondary | ICD-10-CM | POA: Diagnosis not present

## 2022-02-22 DIAGNOSIS — M86172 Other acute osteomyelitis, left ankle and foot: Secondary | ICD-10-CM | POA: Diagnosis present

## 2022-02-22 DIAGNOSIS — M4626 Osteomyelitis of vertebra, lumbar region: Secondary | ICD-10-CM | POA: Diagnosis not present

## 2022-02-22 DIAGNOSIS — M47816 Spondylosis without myelopathy or radiculopathy, lumbar region: Secondary | ICD-10-CM | POA: Diagnosis not present

## 2022-02-22 DIAGNOSIS — I5033 Acute on chronic diastolic (congestive) heart failure: Secondary | ICD-10-CM | POA: Diagnosis present

## 2022-02-22 DIAGNOSIS — A4101 Sepsis due to Methicillin susceptible Staphylococcus aureus: Secondary | ICD-10-CM | POA: Diagnosis present

## 2022-02-22 DIAGNOSIS — F0394 Unspecified dementia, unspecified severity, with anxiety: Secondary | ICD-10-CM | POA: Diagnosis present

## 2022-02-22 DIAGNOSIS — E43 Unspecified severe protein-calorie malnutrition: Secondary | ICD-10-CM | POA: Diagnosis not present

## 2022-02-22 DIAGNOSIS — I132 Hypertensive heart and chronic kidney disease with heart failure and with stage 5 chronic kidney disease, or end stage renal disease: Secondary | ICD-10-CM | POA: Diagnosis not present

## 2022-02-22 DIAGNOSIS — E7849 Other hyperlipidemia: Secondary | ICD-10-CM

## 2022-02-22 DIAGNOSIS — L0390 Cellulitis, unspecified: Secondary | ICD-10-CM | POA: Diagnosis present

## 2022-02-22 DIAGNOSIS — N186 End stage renal disease: Secondary | ICD-10-CM | POA: Diagnosis not present

## 2022-02-22 DIAGNOSIS — Z992 Dependence on renal dialysis: Secondary | ICD-10-CM | POA: Diagnosis not present

## 2022-02-22 DIAGNOSIS — R531 Weakness: Secondary | ICD-10-CM | POA: Diagnosis not present

## 2022-02-22 DIAGNOSIS — Z515 Encounter for palliative care: Secondary | ICD-10-CM | POA: Diagnosis not present

## 2022-02-22 DIAGNOSIS — Z1152 Encounter for screening for COVID-19: Secondary | ICD-10-CM | POA: Diagnosis not present

## 2022-02-22 DIAGNOSIS — Z9181 History of falling: Secondary | ICD-10-CM | POA: Diagnosis not present

## 2022-02-22 DIAGNOSIS — M40204 Unspecified kyphosis, thoracic region: Secondary | ICD-10-CM | POA: Diagnosis not present

## 2022-02-22 DIAGNOSIS — Z7401 Bed confinement status: Secondary | ICD-10-CM | POA: Diagnosis not present

## 2022-02-22 DIAGNOSIS — I4819 Other persistent atrial fibrillation: Secondary | ICD-10-CM | POA: Diagnosis present

## 2022-02-22 DIAGNOSIS — F0390 Unspecified dementia without behavioral disturbance: Secondary | ICD-10-CM | POA: Diagnosis not present

## 2022-02-22 DIAGNOSIS — S12000D Unspecified displaced fracture of first cervical vertebra, subsequent encounter for fracture with routine healing: Secondary | ICD-10-CM | POA: Diagnosis not present

## 2022-02-22 DIAGNOSIS — M868X7 Other osteomyelitis, ankle and foot: Secondary | ICD-10-CM | POA: Diagnosis not present

## 2022-02-22 DIAGNOSIS — J90 Pleural effusion, not elsewhere classified: Secondary | ICD-10-CM | POA: Diagnosis not present

## 2022-02-22 DIAGNOSIS — Z7189 Other specified counseling: Secondary | ICD-10-CM | POA: Diagnosis not present

## 2022-02-22 DIAGNOSIS — F03B18 Unspecified dementia, moderate, with other behavioral disturbance: Secondary | ICD-10-CM | POA: Diagnosis not present

## 2022-02-22 LAB — PREALBUMIN: Prealbumin: 7 mg/dL — ABNORMAL LOW (ref 18–38)

## 2022-02-22 LAB — ECHOCARDIOGRAM COMPLETE
AR max vel: 2.38 cm2
AV Area VTI: 2.45 cm2
AV Area mean vel: 2.31 cm2
AV Mean grad: 12.5 mmHg
AV Peak grad: 19 mmHg
Ao pk vel: 2.18 m/s
Area-P 1/2: 4.8 cm2
Calc EF: 56.4 %
Height: 72 in
S' Lateral: 4.13 cm
Single Plane A2C EF: 50.6 %
Single Plane A4C EF: 59.4 %
Weight: 2736 oz

## 2022-02-22 LAB — RESPIRATORY PANEL BY PCR

## 2022-02-22 LAB — I-STAT VENOUS BLOOD GAS, ED
Acid-Base Excess: 3 mmol/L — ABNORMAL HIGH (ref 0.0–2.0)
Bicarbonate: 25.2 mmol/L (ref 20.0–28.0)
Calcium, Ion: 1.03 mmol/L — ABNORMAL LOW (ref 1.15–1.40)
HCT: 21 % — ABNORMAL LOW (ref 39.0–52.0)
Hemoglobin: 7.1 g/dL — ABNORMAL LOW (ref 13.0–17.0)
O2 Saturation: 43 %
Potassium: 2.9 mmol/L — ABNORMAL LOW (ref 3.5–5.1)
Sodium: 135 mmol/L (ref 135–145)
TCO2: 26 mmol/L (ref 22–32)
pCO2, Ven: 27.6 mmHg — ABNORMAL LOW (ref 44–60)
pH, Ven: 7.568 — ABNORMAL HIGH (ref 7.25–7.43)
pO2, Ven: 20 mmHg — CL (ref 32–45)

## 2022-02-22 LAB — RESP PANEL BY RT-PCR (FLU A&B, COVID) ARPGX2
Influenza A by PCR: NEGATIVE
Influenza B by PCR: NEGATIVE
SARS Coronavirus 2 by RT PCR: NEGATIVE

## 2022-02-22 LAB — URINALYSIS, COMPLETE (UACMP) WITH MICROSCOPIC
Bilirubin Urine: NEGATIVE
Glucose, UA: NEGATIVE mg/dL
Ketones, ur: NEGATIVE mg/dL
Leukocytes,Ua: NEGATIVE
Nitrite: NEGATIVE
Protein, ur: 30 mg/dL — AB
Specific Gravity, Urine: 1.013 (ref 1.005–1.030)
pH: 5 (ref 5.0–8.0)

## 2022-02-22 LAB — COMPREHENSIVE METABOLIC PANEL
ALT: 19 U/L (ref 0–44)
AST: 37 U/L (ref 15–41)
Albumin: 2.4 g/dL — ABNORMAL LOW (ref 3.5–5.0)
Alkaline Phosphatase: 62 U/L (ref 38–126)
Anion gap: 10 (ref 5–15)
BUN: 24 mg/dL — ABNORMAL HIGH (ref 8–23)
CO2: 25 mmol/L (ref 22–32)
Calcium: 7.9 mg/dL — ABNORMAL LOW (ref 8.9–10.3)
Chloride: 100 mmol/L (ref 98–111)
Creatinine, Ser: 2.32 mg/dL — ABNORMAL HIGH (ref 0.61–1.24)
GFR, Estimated: 28 mL/min — ABNORMAL LOW (ref >60)
Glucose, Bld: 96 mg/dL (ref 70–99)
Potassium: 3 mmol/L — ABNORMAL LOW (ref 3.5–5.1)
Sodium: 135 mmol/L (ref 135–145)
Total Bilirubin: 1.6 mg/dL — ABNORMAL HIGH (ref 0.3–1.2)
Total Protein: 4.8 g/dL — ABNORMAL LOW (ref 6.5–8.1)

## 2022-02-22 LAB — CREATININE, URINE, RANDOM: Creatinine, Urine: 105 mg/dL

## 2022-02-22 LAB — TYPE AND SCREEN
ABO/RH(D): O POS
Antibody Screen: NEGATIVE

## 2022-02-22 LAB — PROTIME-INR
INR: 1.4 — ABNORMAL HIGH (ref 0.8–1.2)
Prothrombin Time: 17.1 seconds — ABNORMAL HIGH (ref 11.4–15.2)

## 2022-02-22 LAB — OSMOLALITY: Osmolality: 282 mOsm/kg (ref 275–295)

## 2022-02-22 LAB — HEPATITIS C ANTIBODY: HCV Ab: NONREACTIVE

## 2022-02-22 LAB — IRON AND TIBC
Iron: 15 ug/dL — ABNORMAL LOW (ref 45–182)
Saturation Ratios: 10 % — ABNORMAL LOW (ref 17.9–39.5)
TIBC: 150 ug/dL — ABNORMAL LOW (ref 250–450)
UIBC: 135 ug/dL

## 2022-02-22 LAB — HEPATITIS B CORE ANTIBODY, TOTAL: Hep B Core Total Ab: NONREACTIVE

## 2022-02-22 LAB — VITAMIN B12: Vitamin B-12: 826 pg/mL (ref 180–914)

## 2022-02-22 LAB — STREP PNEUMONIAE URINARY ANTIGEN: Strep Pneumo Urinary Antigen: NEGATIVE

## 2022-02-22 LAB — FERRITIN: Ferritin: 1703 ng/mL — ABNORMAL HIGH (ref 24–336)

## 2022-02-22 LAB — HEPATITIS B SURFACE ANTIGEN
Hepatitis B Surface Ag: NONREACTIVE
Hepatitis B Surface Ag: NONREACTIVE

## 2022-02-22 LAB — TROPONIN I (HIGH SENSITIVITY): Troponin I (High Sensitivity): 524 ng/L (ref <18)

## 2022-02-22 LAB — FOLATE: Folate: 20.8 ng/mL (ref >5.9)

## 2022-02-22 LAB — HEPATITIS B SURFACE ANTIBODY,QUALITATIVE: Hep B S Ab: NONREACTIVE

## 2022-02-22 LAB — HIV ANTIBODY (ROUTINE TESTING W REFLEX): HIV Screen 4th Generation wRfx: NONREACTIVE

## 2022-02-22 LAB — PROCALCITONIN: Procalcitonin: 8.8 ng/mL

## 2022-02-22 LAB — LACTIC ACID, PLASMA: Lactic Acid, Venous: 1.4 mmol/L (ref 0.5–1.9)

## 2022-02-22 LAB — MRSA NEXT GEN BY PCR, NASAL: MRSA by PCR Next Gen: NOT DETECTED

## 2022-02-22 LAB — AMMONIA: Ammonia: 13 umol/L (ref 9–35)

## 2022-02-22 LAB — APTT: aPTT: 42 seconds — ABNORMAL HIGH (ref 24–36)

## 2022-02-22 LAB — SODIUM, URINE, RANDOM: Sodium, Ur: 11 mmol/L

## 2022-02-22 LAB — MAGNESIUM: Magnesium: 1.7 mg/dL (ref 1.7–2.4)

## 2022-02-22 LAB — TSH: TSH: 2.489 u[IU]/mL (ref 0.350–4.500)

## 2022-02-22 LAB — CK: Total CK: 256 U/L (ref 49–397)

## 2022-02-22 LAB — OSMOLALITY, URINE: Osmolality, Ur: 302 mOsm/kg (ref 300–900)

## 2022-02-22 LAB — PHOSPHORUS: Phosphorus: 2.4 mg/dL — ABNORMAL LOW (ref 2.5–4.6)

## 2022-02-22 MED ORDER — POTASSIUM CHLORIDE CRYS ER 20 MEQ PO TBCR
20.0000 meq | EXTENDED_RELEASE_TABLET | Freq: Once | ORAL | Status: AC
  Administered 2022-02-22: 20 meq via ORAL
  Filled 2022-02-22: qty 1

## 2022-02-22 MED ORDER — LIDOCAINE-PRILOCAINE 2.5-2.5 % EX CREA: 1.0000 | TOPICAL_CREAM | CUTANEOUS | Status: DC | PRN

## 2022-02-22 MED ORDER — LIDOCAINE HCL (PF) 1 % IJ SOLN: 5.0000 mL | INTRAMUSCULAR | Status: DC | PRN

## 2022-02-22 MED ORDER — PENTAFLUOROPROP-TETRAFLUOROETH EX AERO: 1.0000 | INHALATION_SPRAY | CUTANEOUS | Status: DC | PRN

## 2022-02-22 MED ORDER — ACETAMINOPHEN 650 MG RE SUPP: 650.0000 mg | Freq: Four times a day (QID) | RECTAL | Status: DC | PRN

## 2022-02-22 MED ORDER — ALBUMIN HUMAN 25 % IV SOLN
INTRAVENOUS | Status: AC
  Administered 2022-02-22 (×2): 12.5 g
  Filled 2022-02-22: qty 100

## 2022-02-22 MED ORDER — CEFAZOLIN SODIUM-DEXTROSE 1-4 GM/50ML-% IV SOLN
1.0000 g | INTRAVENOUS | Status: AC
  Administered 2022-02-22 – 2022-02-23 (×2): 1 g via INTRAVENOUS
  Filled 2022-02-22 (×2): qty 50

## 2022-02-22 MED ORDER — ESCITALOPRAM OXALATE 10 MG PO TABS
10.0000 mg | ORAL_TABLET | Freq: Every morning | ORAL | Status: DC
  Administered 2022-02-22 – 2022-03-06 (×13): 10 mg via ORAL
  Filled 2022-02-22 (×13): qty 1

## 2022-02-22 MED ORDER — HEPARIN SODIUM (PORCINE) 1000 UNIT/ML DIALYSIS: 1000.0000 [IU] | INTRAMUSCULAR | Status: DC | PRN

## 2022-02-22 MED ORDER — CHLORHEXIDINE GLUCONATE CLOTH 2 % EX PADS: 6.0000 | MEDICATED_PAD | Freq: Every day | CUTANEOUS | Status: DC

## 2022-02-22 MED ORDER — ACETAMINOPHEN 325 MG PO TABS
650.0000 mg | ORAL_TABLET | Freq: Four times a day (QID) | ORAL | Status: DC | PRN
  Administered 2022-02-22 – 2022-02-23 (×2): 650 mg via ORAL
  Filled 2022-02-22 (×2): qty 2

## 2022-02-22 MED ORDER — ALTEPLASE 2 MG IJ SOLR: 2.0000 mg | Freq: Once | INTRAMUSCULAR | Status: DC | PRN

## 2022-02-22 MED ORDER — AMIODARONE HCL 200 MG PO TABS: 200.0000 mg | ORAL_TABLET | Freq: Every morning | ORAL | Status: DC

## 2022-02-22 MED ORDER — LORAZEPAM 2 MG/ML IJ SOLN
0.5000 mg | INTRAMUSCULAR | Status: AC | PRN
  Administered 2022-02-22 – 2022-03-01 (×2): 0.5 mg via INTRAVENOUS
  Filled 2022-02-22 (×2): qty 1

## 2022-02-22 MED ORDER — SODIUM CHLORIDE 0.9% FLUSH: 3.0000 mL | INTRAVENOUS | Status: DC | PRN

## 2022-02-22 MED ORDER — HYDROCODONE-ACETAMINOPHEN 5-325 MG PO TABS
1.0000 | ORAL_TABLET | ORAL | Status: DC | PRN
  Administered 2022-02-23: 1 via ORAL
  Administered 2022-02-24 – 2022-02-25 (×2): 2 via ORAL
  Administered 2022-02-26 (×3): 1 via ORAL
  Administered 2022-02-26 – 2022-03-02 (×9): 2 via ORAL
  Filled 2022-02-22: qty 2
  Filled 2022-02-22: qty 1
  Filled 2022-02-22 (×5): qty 2
  Filled 2022-02-22: qty 1
  Filled 2022-02-22 (×3): qty 2
  Filled 2022-02-22 (×2): qty 1
  Filled 2022-02-22 (×3): qty 2

## 2022-02-22 MED ORDER — TAMSULOSIN HCL 0.4 MG PO CAPS
0.4000 mg | ORAL_CAPSULE | Freq: Every day | ORAL | Status: DC
  Administered 2022-02-22 – 2022-03-06 (×12): 0.4 mg via ORAL
  Filled 2022-02-22 (×12): qty 1

## 2022-02-22 MED ORDER — ANTICOAGULANT SODIUM CITRATE 4% (200MG/5ML) IV SOLN: 5.0000 mL | Status: DC | PRN

## 2022-02-22 MED ORDER — LIDOCAINE 5 % EX PTCH
3.0000 | MEDICATED_PATCH | CUTANEOUS | Status: DC
  Administered 2022-02-23 – 2022-03-05 (×5): 3 via TRANSDERMAL
  Filled 2022-02-22 (×8): qty 3

## 2022-02-22 MED ORDER — SODIUM CHLORIDE 0.9% FLUSH
3.0000 mL | Freq: Two times a day (BID) | INTRAVENOUS | Status: DC
  Administered 2022-02-22 – 2022-03-06 (×22): 3 mL via INTRAVENOUS

## 2022-02-22 MED ORDER — SODIUM CHLORIDE 0.9 % IV SOLN
250.0000 mL | INTRAVENOUS | Status: DC | PRN
  Administered 2022-03-03: 250 mL via INTRAVENOUS

## 2022-02-22 MED ORDER — AMIODARONE HCL 200 MG PO TABS
200.0000 mg | ORAL_TABLET | Freq: Every morning | ORAL | Status: DC
  Administered 2022-02-22 – 2022-03-06 (×13): 200 mg via ORAL
  Filled 2022-02-22 (×13): qty 1

## 2022-02-22 NOTE — Assessment & Plan Note (Signed)
- -  Patient presenting with  feve  and infiltrate in  -Infiltrate on CXR and 2-3 characteristics (fever, leukocytosis, purulent sputum) are consistent with pneumonia. -This appears to be most likely community-acquired pneumonia.      will admit for treatment of CAP will start on appropriate antibiotic coverage. -cefepime and vanc   Obtain:  sputum cultures,                  Obtain respiratory panel                   influenza serologies negative                  COVID PCR negative                    blood cultures and sputum cultures ordered                   strep pneumo UA antigen,                  check for Legionella antigen.                Provide oxygen as needed.

## 2022-02-22 NOTE — Consult Note (Signed)
Cardiology Consultation   Patient ID: Darryl Diaz MRN: [id number]; DOB: [birth year]  Admit date: 02/21/2022 Date of Consult: 02/22/2022  PCP:  Darryl Pao, MD   [facility] Cardiologist:  Darryl Mocha, MD  Cardiology APP:  Darryl Shi, PA-C  Electrophysiologist:  Darryl Axe, MD     Patient Profile:   Darryl Diaz is a 81 y.o. male with a hx of ESRD on HD MWF, paroxysmal atrial fibrillation, HFpEF, OSA, HTN, HLD, GERD, GI bleed secondary to Mallory-Weiss Tear in 2020 who is being seen 02/22/2022 for the evaluation of elevated troponin at the request of Dr. Lonny Diaz.  History of Present Illness:   Darryl Diaz is an 81 year old male with above medical history who is followed by Dr. Burt Diaz, Dr. Caryl Diaz.  Patient has a complex EP history involving paroxysmal atrial fibrillation.  His anticoagulation was held in 08/2018 due to posthemorrhagic anemia following fall.  He was started on amiodarone during an admission in 02/2019.  However, amiodarone was ultimately discontinued in 04/2019 due to bradycardia. Patient was referred to EP in 05/2019 for consideration of PPM, however Dr. 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 Patient was later admitted to the hospital in 01/2020 with syncope, found to be in atrial fibrillation with RVR.  Underwent cardioversion and was placed on flecainide.  Was again admitted in 03/2020 with A-fib with RVR in the setting of Salmonella gastritis.  Again, patient was started on amiodarone with conversion to normal sinus rhythm.  Echocardiogram in 02/2020 showed EF of 60-65%, no regional wall motion abnormalities, apical RV hypokinesis, mildly dilated left atria, trivial mitral valve regurgitation.  Nuclear stress test on 02/23/2020 was a normal, low risk study. Estimated EF was 55-60%.   Most recent echocardiogram was completed on 01/17/2021.  Showed EF 60-65%, no regional wall motion  abnormalities, mild LVH, normal RV systolic function, normal pulmonary artery systolic pressure, severely dilated left atrium, trivial mitral valve regurgitation, mild aortic valve stenosis, mild dilation of the aortic root measuring 40 mm.  Patient had a lower GI bleed in 01/2021.  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  Patient was last seen by cardiology on 06/01/2021.  Noted that his volume status had improved since starting dialysis.  Patient had been maintaining normal sinus rhythm on amiodarone, and he remained off anticoagulation due to history of bleeding.  Patient was most recently admitted to the hospital from 9/15-9/21 for treatment of a Darryl Diaz secondary to a mechanical fall. He was discharged to a skilled nursing facility    Patient presented to the ED on 10/16 complaining of generalized weakness, diarrhea that began the day prior. Also admitted to dysuria for 4 days. Labs in the ED showed Na 137, K 3.2, creatinine 2.05, WBC 7.9, hemoglobin 8.4, platelets 83. Lactic acid elevated to 2.8. hsTn 375>>436>>524. Found to have MSSA bacteremia.   CXR in the ED showed bibasilar atelectasis and trace bilateral pleural effusions. EKG showed normal sinus rhythm, HR 91 BPM, inferior and anteroseptal infarcts. 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   Patient was admitted to the hospitalist service for treatment of sepsis. Cardiology was asked to consult for elevated troponin.   I examined patient after HD. Patient denied having any chest pain recently. Denied ever having significant chest pain. Also denied sob, palpitations, ankle  edema, productive cough.   Past Medical History:  Diagnosis Date   Alcohol abuse 02/58/5277   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[place]) 03/17/2020   Alcoholism ([place])    in remission following wife's death   Anemia    Anxiety     Ascending aorta dilation (HCC)    Atrial fibrillation (HCC)    Cardiac arrest ([place]) 04/20/2019   12 min CPR with epi   Chronic diastolic CHF (congestive heart failure) ([place])    Dysrhythmia    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[place])    Mon Wed Fri   Fatty liver    Diaz of hip ([place]) 03/30/2016   Gastritis    GERD (gastroesophageal reflux disease)    H/O seasonal allergies    Heme positive stool 05/14/2020   History of blood transfusion    History of GI bleed    Hx of adenomatous colonic polyps 08/10/2017   Hyperlipidemia    Hypertension    Insomnia    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   Mallory-Weiss tear    OA (osteoarthritis)    hands   OSA (obstructive sleep apnea)    No longer uses CPAP   Persistent atrial fibrillation with rapid ventricular response ([place]) 02/05/2020   Posterior dislocation of hip, closed ([place]) 04/30/2016   Recurrent syncope    Restless legs syndrome (RLS)    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([place])     Past Surgical History:  Procedure Laterality Date   AMPUTATION Left 05/19/2021   Procedure: LEFT GREAT TOE AMPUTATION;  Surgeon: Newt Minion, MD;  Location: [facility];  Service: Orthopedics;  Laterality: Left;   ANKLE SURGERY Left 2014   had rods put in    AV FISTULA PLACEMENT Left 04/21/2021   Procedure: LEFT ARM ARTERIOVENOUS (AV) FISTULA CREATION - Left brachiocephalic fistula;  Surgeon: Marty Heck, MD;  Location: [facility];  Service: Vascular;  Laterality: Left;   BIOPSY  02/28/2019   Procedure: BIOPSY;  Surgeon: Milus Banister, MD;  Location: [facility];  Service: Endoscopy;;   CARDIOVERSION  02/06/2020   CARDIOVERSION N/A 02/06/2020   Procedure: CARDIOVERSION;  Surgeon: Werner Lean, MD;  Location: [facility]COPY;  Service: Cardiovascular;  Laterality: N/A;   CARDIOVERSION N/A 02/24/2020   Procedure: CARDIOVERSION;  Surgeon: Werner Lean, MD;  Location: [facility];  Service: Cardiovascular;  Laterality: N/A;    COLONOSCOPY     [place]   COLONOSCOPY WITH PROPOFOL N/A 01/08/2021   Procedure: COLONOSCOPY WITH PROPOFOL;  Surgeon: Milus Banister, MD;  Location: [facility] ENDOSCOPY;  Service: Endoscopy;  Laterality: N/A;   ESOPHAGOGASTRODUODENOSCOPY (EGD) WITH PROPOFOL N/A 02/28/2019   Procedure: ESOPHAGOGASTRODUODENOSCOPY (EGD) WITH PROPOFOL;  Surgeon: Milus Banister, MD;  Location: [facility] ENDOSCOPY;  Service: Endoscopy;  Laterality: N/A;   Diaz SURGERY     HIP ARTHROPLASTY Left 04/01/2016   Procedure: ARTHROPLASTY BIPOLAR HIP (HEMIARTHROPLASTY);  Surgeon: Paralee Cancel, MD;  Location: [place];  Service: Orthopedics;  Laterality: Left;   HIP CLOSED REDUCTION Left 04/30/2016   Procedure: CLOSED REDUCTION HIP;  Surgeon: Wylene Simmer, MD;  Location: [place];  Service: Orthopedics;  Laterality: Left;   HOT HEMOSTASIS N/A 01/08/2021   Procedure: HOT HEMOSTASIS (ARGON PLASMA COAGULATION/BICAP);  Surgeon: Milus Banister, MD;  Location: [facility] ENDOSCOPY;  Service: Endoscopy;  Laterality: N/A;   INSERTION OF DIALYSIS CATHETER N/A 01/25/2022   Procedure: INSERTION OF TUNNELED DIALYSIS CATHETER;  Surgeon: Angelia Mould, MD;  Location: [place];  Service: Vascular;  Laterality: N/A;   IR RADIOLOGIST EVAL & MGMT  12/31/2020   JOINT REPLACEMENT     TONSILLECTOMY     TOTAL HIP ARTHROPLASTY Right 10/15/2019   Procedure: TOTAL HIP ARTHROPLASTY ANTERIOR APPROACH;  Surgeon: Paralee Cancel, MD;  Location: [facility];  Service: Orthopedics;  Laterality: Right;  70 mins   VASCULAR SURGERY       Home Medications:  Prior to Admission medications   Medication Sig Start Date End Date Taking? Authorizing Provider  amiodarone (PACERONE) 200 MG tablet TAKE 1 TABLET BY MOUTH ONCE DAILY Patient taking differently: Take 200 mg by mouth every morning. 05/17/21  Yes Weaver, Scott T, PA-C  atorvastatin (LIPITOR) 20 MG tablet Take 1 tablet (20 mg total) by mouth daily. 03/10/20  Yes Angiulli, Lavon Paganini, PA-C  carvedilol (COREG) 6.25 MG tablet  Take 1 tablet (6.25 mg total) by mouth 2 (two) times daily. Patient taking differently: Take 12.5 mg by mouth daily. 02/11/21  Yes Darryl Mocha, MD  cetirizine (ZYRTEC) 10 MG tablet Take 10 mg by mouth daily.   Yes [provider]  escitalopram (LEXAPRO) 10 MG tablet Take 1 tablet (10 mg total) by mouth daily. Patient taking differently: Take 10 mg by mouth every morning. 03/10/20  Yes Angiulli, Lavon Paganini, PA-C  ferrous sulfate 324 MG TBEC Take 324 mg by mouth every morning.   Yes [provider]  FOLIC ACID PO Take 1 tablet by mouth every morning.   Yes [provider]  Multiple Vitamin (MULTIVITAMIN PO) Take 1 tablet by mouth every other day.   Yes [provider]  nitroGLYCERIN (NITROSTAT) 0.4 MG SL tablet Place 1 tablet (0.4 mg total) under the tongue every 5 (five) minutes x 3 doses as needed for chest pain. 07/28/20  Yes Baldwin Jamaica, PA-C  pantoprazole (PROTONIX) 40 MG tablet Take 1 tablet (40 mg total) by mouth daily. Patient taking differently: Take 40 mg by mouth every morning. 03/10/20  Yes Angiulli, Lavon Paganini, PA-C  saccharomyces boulardii (FLORASTOR) 250 MG capsule Take 1 capsule (250 mg total) by mouth 2 (two) times daily. 01/27/22  Yes Regalado, Belkys A, MD  tamsulosin (FLOMAX) 0.4 MG CAPS capsule Take 0.4 mg by mouth at bedtime.   Yes [provider]  thiamine (VITAMIN B-1) 100 MG tablet Take 1 tablet (100 mg total) by mouth daily. 01/28/22  Yes Regalado, Belkys A, MD  torsemide (DEMADEX) 20 MG tablet Take 60 mg by mouth daily.   Yes [provider]  traZODone (DESYREL) 50 MG tablet Take 1 tablet (50 mg total) by mouth at bedtime. 11/26/21  Yes Alma Friendly, MD  doxycycline (VIBRAMYCIN) 100 MG capsule Take 1 capsule (100 mg total) by mouth 2 (two) times daily. One po bid x 7 days Patient not taking: Reported on 02/22/2022 02/21/22   Mesner, Corene Cornea, MD    Inpatient Medications: Scheduled Meds:  amiodarone  200 mg Oral q  morning   Chlorhexidine Gluconate Cloth  6 each Topical Q0600   escitalopram  10 mg Oral q morning   sodium chloride flush  3 mL Intravenous Q12H   tamsulosin  0.4 mg Oral QHS   Continuous Infusions:  sodium chloride     anticoagulant sodium citrate      ceFAZolin (ANCEF) IV     PRN Meds: sodium chloride, acetaminophen **OR** acetaminophen, alteplase, anticoagulant sodium citrate, heparin, HYDROcodone-acetaminophen, lidocaine (PF), lidocaine-prilocaine, pentafluoroprop-tetrafluoroeth, sodium chloride flush  Allergies:    Allergies  Allergen Reactions   Penicillin G Sodium  Rash and Other (See Comments)     (tolerates Keflex)  NOT DOCUMENTED ON THE MAR   Penicillins Rash    NOT DOCUMENTED ON THE MAR    Social History:   Social History   Socioeconomic History   Marital status: Widowed    Spouse name: Not on file   Number of children: 2   Years of education: college   Highest education level: Not on file  Occupational History   Occupation: retired  Tobacco Use   Smoking status: Former    Packs/day: 1.00    Years: 20.00    Total pack years: 20.00    Types: Cigarettes   Smokeless tobacco: Never   Tobacco comments:       quit 20 years  smoked on and off for 20 years  Vaping Use   Vaping Use: Never used  Substance and Sexual Activity   Alcohol use: Not Currently    Alcohol/week: 3.0 standard drinks of alcohol    Types: 3 Glasses of wine per week    Comment: h/o heavy use   Drug use: Never   Sexual activity: Not Currently  Other Topics Concern   Not on file  Social History Narrative   ** Merged History Encounter **       The patient is divorced w/ 1 son 1 daughter He is a retired Regulatory affairs officer, he lived in [place] but [employer] including windows on the world in the [place] room in [place] He moved to [place] to be near his sister who liv   es in [place] alcoholic member of AA 2 caffeinated beverages daily prior  smoker no drugs   Social Determinants of Radio broadcast assistant Strain: Low Risk  (10/23/2020)   Overall Financial Resource Strain (CARDIA)    Difficulty of Paying Living Expenses: Not hard at all  Food Insecurity: No Food Insecurity (10/23/2020)   Hunger Vital Sign    Worried About Running Out of Food in the Last Year: Never true    [place] in the Last Year: Never true  Transportation Needs: No Transportation Needs (10/23/2020)   PRAPARE - Hydrologist (Medical): No    Lack of Transportation (Non-Medical): No  Physical Activity: Not on file  Stress: No Stress Concern Present (10/23/2020)   [place]    Feeling of Stress : Not at all  Social Connections: Moderately Integrated (10/23/2020)   Social Connection and Isolation Panel [NHANES]    Frequency of Communication with Friends and Family: More than three times a week    Frequency of Social Gatherings with Friends and Family: More than three times a week    Attends Religious Services: 1 to 4 times per year    Active Member of [employer] or Organizations: No    Attends Archivist Meetings: 1 to 4 times per year    Marital Status: Widowed  Intimate Partner Violence: Not At Risk (10/23/2020)   Humiliation, Afraid, Rape, and Kick questionnaire    Fear of Current or Ex-Partner: No    Emotionally Abused: No    Physically Abused: No    Sexually Abused: No    Family History:    Family History  Problem Relation Age of Onset   Heart disease Mother 46   Hypertension Mother    Arthritis Mother    Heart failure Father 71   Stroke Maternal Aunt  Heart failure Sister    Colon cancer Neg Hx    Esophageal cancer Neg Hx    Stomach cancer Neg Hx    Rectal cancer Neg Hx      ROS:  Please see the history of present illness.   All other ROS reviewed and negative.     Physical Exam/Data:   Vitals:   02/22/22 1130 02/22/22  1200 02/22/22 1212 02/22/22 1227  BP: (!) 123/54 (!) 105/47 (!) 96/44 (!) 79/45  Pulse: 81 78 79   Resp: (!) 22 (!) 21 (!) 23   Temp:      TempSrc:      SpO2: 96% 100% 100%   Weight:      Height:       No intake or output data in the 24 hours ending 02/22/22 1242    02/21/2022    7:59 PM 01/26/2022    1:54 PM  Last 3 Weights  Weight (lbs) 171 lb 171 lb 11.8 oz  Weight (kg) 77.565 kg 77.9 kg     Body mass index is 23.19 kg/m.  General:  Well nourished, well developed, in no acute distress. Laying in bed with head elevated  HEENT: normal Neck: Unable to assess JVD due to C-collar  Vascular: Radial pulses 2+ bilaterally Cardiac:  normal S1, S2; RRR; no murmur  Lungs: anterior lung exam clear to auscultation bilaterally, no wheezing, rhonchi or rales heard. Unable to perform posterior lung exam as patient was actively receiving dialysis  Abd: soft, nontender, no hepatomegaly  Ext: no edema in BLE.  Musculoskeletal:  Left great toe has been amputated  Skin: warm and dry  Neuro:  CNs 2-12 intact, no focal abnormalities noted Psych:  Normal affect   EKG:  The EKG was personally reviewed and demonstrates:  normal sinus rhythm, HR 91 BPM, inferior and anteroseptal infarcts Telemetry:  Telemetry was personally reviewed and demonstrates:  Normal sinus rhythm, occasional PVCs   Relevant CV Studies:  Echocardiogram 02/22/22 1. Left ventricular ejection fraction, by estimation, is 60 to 65%. The  left ventricle has normal function. The left ventricle has no regional  wall motion abnormalities. Left ventricular diastolic parameters are  consistent with Grade I diastolic  dysfunction (impaired relaxation). Elevated left ventricular end-diastolic  pressure. The E/e' is 50.   2. Right ventricular systolic function is normal. The right ventricular  size is normal. There is normal pulmonary artery systolic pressure. The  estimated right ventricular systolic pressure is 56.4 mmHg.   3.  Left atrial size was moderately dilated.   4. The mitral valve is abnormal. Mild mitral valve regurgitation.  Moderate mitral annular calcification.   5. The aortic valve is tricuspid. Aortic valve regurgitation is not  visualized. Mild aortic valve stenosis. Aortic valve area, by VTI measures  2.45 cm. Aortic valve mean gradient measures 12.5 mmHg. Aortic valve Vmax  measures 2.18 m/s.   6. Aortic dilatation noted. There is borderline dilatation of the  ascending aorta, measuring 39 mm.   7. The inferior vena cava is dilated in size with <50% respiratory  variability, suggesting right atrial pressure of 15 mmHg.   8. Cannot exclude a small PFO.   Comparison(s): Changes from prior study are noted. 01/17/2021: LVEF 60-65%,  mild AS - mean gradient 12 mmHg.   Laboratory Data:  High Sensitivity Troponin:   Recent Labs  Lab 02/21/22 2009 02/21/22 2300 02/22/22 0230  TROPONINIHS 375* 346* 524*     Chemistry Recent Labs  Lab 02/20/22 2308  02/21/22 2009 02/22/22 0230 02/22/22 0243  NA 129* 137 135 135  K 3.5 3.2* 3.0* 2.9*  CL 94* 98 100  --   CO2 20* 25 25  --   GLUCOSE 123* 92 96  --   BUN 50* 20 24*  --   CREATININE 3.69* 2.05* 2.32*  --   CALCIUM 8.2* 8.2* 7.9*  --   MG 1.4* 1.7 1.7  --   GFRNONAA 16* 32* 28*  --   ANIONGAP '15 14 10  '$ --     Recent Labs  Lab 02/20/22 2308 02/21/22 2009 02/22/22 0230  PROT 5.5* 5.2* 4.8*  ALBUMIN 3.0* 2.6* 2.4*  AST 28 40 37  ALT '15 17 19  '$ ALKPHOS 103 76 62  BILITOT 1.4* 1.6* 1.6*   Lipids No results for input(s): "CHOL", "TRIG", "HDL", "LABVLDL", "LDLCALC", "CHOLHDL" in the last 168 hours.  Hematology Recent Labs  Lab 02/20/22 2308 02/21/22 2009 02/22/22 0243  WBC 5.7 7.9  --   RBC 2.60* 2.37*  --   HGB 9.3* 8.4* 7.1*  HCT 26.4* 23.9* 21.0*  MCV 101.5* 100.8*  --   MCH 35.8* 35.4*  --   MCHC 35.2 35.1  --   RDW 14.8 14.6  --   PLT 93* 83*  --    Thyroid  Recent Labs  Lab 02/22/22 0230  TSH 2.489    BNPNo  results for input(s): "BNP", "PROBNP" in the last 168 hours.  DDimer No results for input(s): "DDIMER" in the last 168 hours.   Radiology/Studies:  ECHOCARDIOGRAM COMPLETE  Result Date: 02/22/2022    ECHOCARDIOGRAM REPORT   Patient Name:   Darryl Diaz Date of Exam: 02/22/2022 Medical Rec #:  [id number]        Height:       72.0 in Accession #:    [id number]       Weight:       171.0 lb Date of Birth:  10-03-1940        BSA:          1.993 m Patient Age:    9 years         BP:           110/43 mmHg Patient Gender: M                HR:           124 bpm. Exam Location:  Inpatient Procedure: 2D Echo, Cardiac Doppler and Color Doppler Indications:    elevated trops  History:        Patient has prior history of Echocardiogram examinations, most                 recent 01/17/2021. CAD, Arrythmias:Bradycardia; Risk                 Factors:Hypertension and Dyslipidemia.  Sonographer:    Melissa Morford RDCS (AE, PE) Referring Phys: [place]  1. Left ventricular ejection fraction, by estimation, is 60 to 65%. The left ventricle has normal function. The left ventricle has no regional wall motion abnormalities. Left ventricular diastolic parameters are consistent with Grade I diastolic dysfunction (impaired relaxation). Elevated left ventricular end-diastolic pressure. The E/e' is 78.  2. Right ventricular systolic function is normal. The right ventricular size is normal. There is normal pulmonary artery systolic pressure. The estimated right ventricular systolic pressure is 87.8 mmHg.  3. Left atrial size was moderately dilated.  4. The mitral valve is abnormal.  Mild mitral valve regurgitation. Moderate mitral annular calcification.  5. The aortic valve is tricuspid. Aortic valve regurgitation is not visualized. Mild aortic valve stenosis. Aortic valve area, by VTI measures 2.45 cm. Aortic valve mean gradient measures 12.5 mmHg. Aortic valve Vmax measures 2.18 m/s.  6. Aortic dilatation  noted. There is borderline dilatation of the ascending aorta, measuring 39 mm.  7. The inferior vena cava is dilated in size with <50% respiratory variability, suggesting right atrial pressure of 15 mmHg.  8. Cannot exclude a small PFO. Comparison(s): Changes from prior study are noted. 01/17/2021: LVEF 60-65%, mild AS - mean gradient 12 mmHg. FINDINGS  Left Ventricle: Left ventricular ejection fraction, by estimation, is 60 to 65%. The left ventricle has normal function. The left ventricle has no regional wall motion abnormalities. The left ventricular internal cavity size was normal in size. There is  no left ventricular hypertrophy. Left ventricular diastolic parameters are consistent with Grade I diastolic dysfunction (impaired relaxation). Elevated left ventricular end-diastolic pressure. The E/e' is 15. Right Ventricle: The right ventricular size is normal. No increase in right ventricular wall thickness. Right ventricular systolic function is normal. There is normal pulmonary artery systolic pressure. The tricuspid regurgitant velocity is 1.63 m/s, and  with an assumed right atrial pressure of 15 mmHg, the estimated right ventricular systolic pressure is 78.2 mmHg. Left Atrium: Left atrial size was moderately dilated. Right Atrium: Right atrial size was normal in size. Pericardium: There is no evidence of pericardial effusion. Mitral Valve: The mitral valve is abnormal. There is moderate calcification of the posterior mitral valve leaflet(s). Moderate mitral annular calcification. Mild mitral valve regurgitation. Tricuspid Valve: The tricuspid valve is grossly normal. Tricuspid valve regurgitation is trivial. Aortic Valve: The aortic valve is tricuspid. Aortic valve regurgitation is not visualized. Mild aortic stenosis is present. Aortic valve mean gradient measures 12.5 mmHg. Aortic valve peak gradient measures 19.0 mmHg. Aortic valve area, by VTI measures 2.45 cm. Pulmonic Valve: The pulmonic valve was  grossly normal. Pulmonic valve regurgitation is trivial. Aorta: Aortic dilatation noted. There is borderline dilatation of the ascending aorta, measuring 39 mm. Venous: The inferior vena cava is dilated in size with less than 50% respiratory variability, suggesting right atrial pressure of 15 mmHg. IAS/Shunts: Cannot exclude a small PFO.  LEFT VENTRICLE PLAX 2D LVIDd:         5.33 cm      Diastology LVIDs:         4.13 cm      LV e' medial:    8.92 cm/s LV PW:         0.93 cm      LV E/e' medial:  13.9 LV IVS:        0.87 cm      LV e' lateral:   6.64 cm/s LVOT diam:     2.10 cm      LV E/e' lateral: 18.7 LV SV:         108 LV SV Index:   54 LVOT Area:     3.46 cm  LV Volumes (MOD) LV vol d, MOD A2C: 143.0 ml LV vol d, MOD A4C: 162.0 ml LV vol s, MOD A2C: 70.6 ml LV vol s, MOD A4C: 65.8 ml LV SV MOD A2C:     72.4 ml LV SV MOD A4C:     162.0 ml LV SV MOD BP:      88.1 ml RIGHT VENTRICLE RV S prime:     11.30 cm/s TAPSE (M-mode):  2.6 cm LEFT ATRIUM             Index        RIGHT ATRIUM           Index LA diam:        4.20 cm 2.11 cm/m   RA Area:     14.50 cm LA Vol (A2C):   77.0 ml 38.63 ml/m  RA Volume:   34.20 ml  17.16 ml/m LA Vol (A4C):   87.3 ml 43.80 ml/m LA Biplane Vol: 87.7 ml 44.00 ml/m  AORTIC VALVE AV Area (Vmax):    2.38 cm AV Area (Vmean):   2.31 cm AV Area (VTI):     2.45 cm AV Vmax:           218.00 cm/s AV Vmean:          167.000 cm/s AV VTI:            0.440 m AV Peak Grad:      19.0 mmHg AV Mean Grad:      12.5 mmHg LVOT Vmax:         150.00 cm/s LVOT Vmean:        111.500 cm/s LVOT VTI:          0.310 m LVOT/AV VTI ratio: 0.71  AORTA Ao Root diam: 3.60 cm Ao Asc diam:  3.90 cm MITRAL VALVE                TRICUSPID VALVE MV Area (PHT): 4.80 cm     TR Peak grad:   10.6 mmHg MV Decel Time: 158 msec     TR Vmax:        163.00 cm/s MV E velocity: 124.00 cm/s MV A velocity: 121.00 cm/s  SHUNTS MV E/A ratio:  1.02         Systemic VTI:  0.31 m                             Systemic Diam: 2.10 cm  Lyman Bishop MD Electronically signed by Lyman Bishop MD Signature Date/Time: 02/22/2022/11:01:26 AM    Final    DG Foot 2 Views Left  Result Date: 02/22/2022 CLINICAL DATA:  Cellulitis EXAM: LEFT FOOT - 2 VIEW COMPARISON:  Radiographs 11/02/2021; MRI 06/08/2021; radiographs 06/07/2021 FINDINGS: 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 Osseous destruction at the base of the second proximal phalanx in the head of the second metatarsal consistent with osteomyelitis. Question osteo myelitis of the medial aspect of the fourth metatarsal head versus projection artifact. Remote posttraumatic deformity about the head of the third metatarsal. 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 Postsurgical changes about the ankle. 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 Question osteomyelitis of the medial aspect of the fourth metatarsal head. Electronically Signed   By: Placido Sou M.D.   On: 02/22/2022 02:04   DG Chest 1 View  Result Date: 02/21/2022 CLINICAL DATA:  Weakness EXAM: CHEST  1 VIEW COMPARISON:  02/20/2022 FINDINGS: Stable cardiomediastinal contours. Slightly low lung volumes with bibasilar atelectasis. Trace effusions. No pneumothorax. Advanced degenerative changes of the left glenohumeral joint. IMPRESSION: Bibasilar atelectasis and trace bilateral pleural effusions. Electronically Signed   By: Davina Poke D.O.   On: 02/21/2022 20:42   CT CHEST ABDOMEN PELVIS WO CONTRAST  Result Date: 02/21/2022 CLINICAL DATA:  81 year old male with history of unintended weight loss and fever. EXAM: CT CHEST, ABDOMEN  AND PELVIS WITHOUT CONTRAST TECHNIQUE: Multidetector CT imaging of the chest, abdomen and pelvis was performed following the standard protocol without IV contrast. RADIATION DOSE REDUCTION: This exam was performed according to the departmental dose-optimization program which includes automated exposure control, adjustment of the mA and/or kV according  to patient size and/or use of iterative reconstruction technique. COMPARISON:  CT of the abdomen and pelvis 11/19/2021. No prior chest CT. FINDINGS: CT CHEST FINDINGS Cardiovascular: Heart size is normal. There is no significant pericardial fluid, thickening or pericardial calcification. There is aortic atherosclerosis, as well as atherosclerosis of the great vessels of the mediastinum and the coronary arteries, including calcified atherosclerotic plaque in the left main, left anterior descending, left circumflex and right coronary arteries. Mild calcifications of the mitral annulus. Moderate calcifications of the aortic valve. Mediastinum/Nodes: No pathologically enlarged mediastinal or hilar lymph nodes. Please note that accurate exclusion of hilar adenopathy is limited on noncontrast CT scans. Esophagus is unremarkable in appearance. No axillary lymphadenopathy. Lungs/Pleura: Trace bilateral pleural effusions lying dependently with minimal passive subsegmental atelectasis in the lower lobes of the lungs bilaterally. There is some very mild patchy areas of ground-glass attenuation in the right upper lobe, nonspecific, but potentially of infectious or inflammatory etiology. No confluent consolidative airspace disease. No definite suspicious appearing pulmonary nodules or masses are noted. Musculoskeletal: Multiple old healed right-sided rib fractures are incidentally noted. There are no aggressive appearing lytic or blastic lesions noted in the visualized portions of the skeleton. CT ABDOMEN PELVIS FINDINGS Hepatobiliary: Liver has a shrunken appearance and nodular contour, indicative of advanced cirrhosis. No discrete cystic or solid hepatic lesions are confidently identified on today's noncontrast CT examination. Multiple tiny calcified gallstones lie dependently in the gallbladder. Gallbladder is moderately distended. Gallbladder wall does not appear thickened or edematous. No definite pericholecystic fluid.  Pancreas: No definite pancreatic mass or peripancreatic fluid collections or inflammatory changes are noted on today's noncontrast CT examination. Spleen: Spleen appears mildly enlarged measuring 11.5 x 7.7 x 12.1 cm (estimated splenic volume of 536 mL) . Adrenals/Urinary Tract: Mild bilateral renal atrophy. In the lower pole of the left kidney there is a partially exophytic low-attenuation 1.7 cm lesion (axial image 78 of series 3) which is incompletely characterized on today's noncontrast CT examination, but similar to prior studies and statistically likely a small cysts (no imaging follow-up is recommended). Unenhanced appearance of the right kidney and bilateral adrenal glands is otherwise unremarkable. No hydroureteronephrosis. Urinary bladder is largely obscured by beam hardening artifact from the patient's bilateral hip arthroplasties. Stomach/Bowel: Unenhanced appearance of the stomach is normal. There is no pathologic dilatation of small bowel or colon. A few scattered colonic diverticuli are noted, without definite focal surrounding inflammatory changes to indicate an acute diverticulitis at this time. Normal appendix. Vascular/Lymphatic: Atherosclerotic calcifications throughout the abdominal aorta and pelvic vasculature. Portal vein appears mildly dilated measuring 17 mm in the porta hepatis. No lymphadenopathy noted in the abdomen or pelvis. Reproductive: Prostate gland and seminal vesicles are obscured by beam hardening artifact. Other: No significant volume of ascites.  No pneumoperitoneum. Musculoskeletal: There are no aggressive appearing lytic or blastic lesions noted in the visualized portions of the skeleton. 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 IMPRESSION: 1. No definite findings in the chest, abdomen or pelvis to account for the patient's unexplained weight loss. There are very subtle areas of mild ground-glass attenuation in the right  upper lobe which are nonspecific, but potentially of infectious or inflammatory etiology.  2. Trace bilateral pleural effusions lying dependently. 3. Cholelithiasis without evidence of acute cholecystitis. 4. 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 5. Colonic diverticulosis without evidence of acute diverticulitis at this time. 6. Aortic atherosclerosis, in addition to left main and three-vessel coronary artery disease. 7. There are calcifications of the aortic valve and mitral annulus. Echocardiographic correlation for evaluation of potential valvular dysfunction may be warranted if clinically indicated. 8. Additional incidental findings, as above. Electronically Signed   By: Vinnie Langton M.D.   On: 02/21/2022 06:26   DG Chest 2 View  Result Date: 02/20/2022 CLINICAL DATA:  Fall. EXAM: CHEST - 2 VIEW COMPARISON:  Chest x-ray 01/25/2022 FINDINGS: The heart size and mediastinal contours are within normal limits. There is minimal atelectasis in the lung bases. Lung volumes are low. Visualized skeletal structures are unremarkable. IMPRESSION: No active cardiopulmonary disease. Electronically Signed   By: Ronney Asters M.D.   On: 02/20/2022 23:07   DG Pelvis Portable  Result Date: 02/20/2022 CLINICAL DATA:  Fall. EXAM: PORTABLE PELVIS 1-2 VIEWS COMPARISON:  06/05/2021. FINDINGS: There is no evidence of pelvic Diaz or diastasis. Bilateral hip arthroplasty changes are noted. There is no evidence of hardware loosening. Degenerative changes are noted in the lower lumbar spine. IMPRESSION: No acute Diaz or dislocation. Electronically Signed   By: Brett Fairy M.D.   On: 02/20/2022 23:05   CT Head Wo Contrast  Result Date: 02/20/2022 CLINICAL DATA:  Fall EXAM: CT HEAD WITHOUT CONTRAST CT CERVICAL SPINE WITHOUT CONTRAST TECHNIQUE: Multidetector CT imaging of the head and cervical spine was performed following the standard protocol  without intravenous contrast. Multiplanar CT image reconstructions of the cervical spine were also generated. RADIATION DOSE REDUCTION: This exam was performed according to the departmental dose-optimization program which includes automated exposure control, adjustment of the mA and/or kV according to patient size and/or use of iterative reconstruction technique. COMPARISON:  01/31/2022 FINDINGS: Motion degraded images. CT HEAD FINDINGS Brain: No evidence of acute infarction, hemorrhage, hydrocephalus, extra-axial collection or mass lesion/mass effect. Global cortical and central atrophy. Subcortical white matter and periventricular small vessel ischemic changes. Vascular: Intracranial atherosclerosis. Skull: Normal. Negative for Diaz or focal lesion. Sinuses/Orbits: The visualized paranasal sinuses are essentially clear. The mastoid air cells are unopacified. Other: None. CT CERVICAL SPINE FINDINGS Alignment: Straightening the cervical spine, likely positional. Skull base and vertebrae: Stable Diaz involving the posterior aspect of the right superior articular facet of Darryl (series 3/image 13). No new/acute Diaz. Soft tissues and spinal canal: No prevertebral fluid or swelling. No visible canal hematoma. Disc levels: Moderate degenerative changes of the lower cervical spine. Spinal canal is patent. Upper chest: Visualized lung apices are essentially clear. Other: Visualized thyroid is grossly unremarkable. IMPRESSION: No acute intracranial abnormality. Atrophy with small vessel ischemic changes. Stable Diaz involving the posterior aspect of the right superior articular facet of Darryl. No new/acute traumatic injury to the cervical spine. Electronically Signed   By: Julian Hy M.D.   On: 02/20/2022 22:48   CT Cervical Spine Wo Contrast  Result Date: 02/20/2022 CLINICAL DATA:  Fall EXAM: CT HEAD WITHOUT CONTRAST CT CERVICAL SPINE WITHOUT CONTRAST TECHNIQUE: Multidetector CT imaging of the head  and cervical spine was performed following the standard protocol without intravenous contrast. Multiplanar CT image reconstructions of the cervical spine were also generated. RADIATION DOSE REDUCTION: This exam was performed according to the departmental dose-optimization program which includes automated exposure control, adjustment of the mA and/or kV  according to patient size and/or use of iterative reconstruction technique. COMPARISON:  01/31/2022 FINDINGS: Motion degraded images. CT HEAD FINDINGS Brain: No evidence of acute infarction, hemorrhage, hydrocephalus, extra-axial collection or mass lesion/mass effect. Global cortical and central atrophy. Subcortical white matter and periventricular small vessel ischemic changes. Vascular: Intracranial atherosclerosis. Skull: Normal. Negative for Diaz or focal lesion. Sinuses/Orbits: The visualized paranasal sinuses are essentially clear. The mastoid air cells are unopacified. Other: None. CT CERVICAL SPINE FINDINGS Alignment: Straightening the cervical spine, likely positional. Skull base and vertebrae: Stable Diaz involving the posterior aspect of the right superior articular facet of Darryl (series 3/image 13). No new/acute Diaz. Soft tissues and spinal canal: No prevertebral fluid or swelling. No visible canal hematoma. Disc levels: Moderate degenerative changes of the lower cervical spine. Spinal canal is patent. Upper chest: Visualized lung apices are essentially clear. Other: Visualized thyroid is grossly unremarkable. IMPRESSION: No acute intracranial abnormality. Atrophy with small vessel ischemic changes. Stable Diaz involving the posterior aspect of the right superior articular facet of Darryl. No new/acute traumatic injury to the cervical spine. Electronically Signed   By: Julian Hy M.D.   On: 02/20/2022 22:48     Assessment and Plan:   Elevated troponin  - hsTn 375>>346>>534. Patient denies chest pain  - Most recent ischemic eval was  a nuclear stress test in 02/2020 that was a normal, low risk study  - Echo this admission showed EF 60-65%, no regional wall motion abnormalities, grade I diastolic dysfunction - Suspect trop elevation is due to demand ischemia in the setting of sepsis, ESRD on HD  - No plans for ischemic evaluation at this time   Acute on Chronic Diastolic Heart Failure  - Echo this admission showed EF 51-76%, grade I diastolic dysfunction, elevated LVEDP, normal RV systolic function - Patient did admit to missing HD recently - When I examined patient at the end of his HD session, he appeared euvolemic on exam. Denies any sob, cough  - Volume will be managed by HD   Dilatation of the Ascending Aorta  - Echocardiogram from 01/2021 showed borderline dilation of the aortic root measuring 40 mm. Echo this admission showed borderline dilation of the ascending aorta measuring 39 mm  - Can follow as an outpatient   Paroxysmal Atrial Fibrillation  - Maintaining NSR per telemetry  - Continue to monitor on telemetry. I would not be surprised if patient developed afib in the setting of sepsis - Continue amiodarone 200 mg daily  - Not on AC due to history of GI bleed  Otherwise per primary  - ESRD on HD  - Sepsis -- ID inquired about TEE. Discussed with Dr. Oval Linsey, given patient's recent Darryl Diaz, we would be able to attempt TEE. However, we would not be able to manipulate the head/neck, so there is a chance that we could not get the probe down the throat  - Anemia of CKD   Risk Assessment/Risk Scores:   New York Heart Association (NYHA) Functional Class NYHA Class III  CHA2DS2-VASc Score = 7   This indicates a 11.2% annual risk of stroke. The patient's score is based upon: CHF History: 1 HTN History: 1 Diabetes History: 0 Stroke History: 2 Vascular Disease History: 1 Age Score: 2 Gender Score: 0     For questions or updates, please contact [place] Please consult  [email or web address] for contact info under    Signed, Margie Billet, PA-C  02/22/2022 12:42 PM

## 2022-02-22 NOTE — Progress Notes (Signed)
Patient blood pressure dropped to 96/44, PRN Albumin administered, second BP check 79/45,100Ml bolus given , second dose of PRN albumin given, stopped pulling fluid. Provider [place] notified. Third BP check 108/52. Will continue to monitor.

## 2022-02-22 NOTE — Assessment & Plan Note (Signed)
-   will monitor on tele avoid QT prolonging medications, rehydrate correct electrolytes ? ?

## 2022-02-22 NOTE — Progress Notes (Signed)
   Patient seen and examined at bedside, patient admitted after midnight, please see earlier detailed admission note by Toy Baker, MD. Briefly, patient presented from his nursing facility secondary to decreased oral intake and weakness and found to have evidence of pneumonia in addition to left foot cellulitis with evidence of osteomyelitis on imaging. Blood cultures positive for GPCs in clusters. Patient started empirically on Vancomycin/Cefepime/Doxycycline and is transitioned to Cefazolin IV per ID.  Subjective: Patient reports no issues. Currently having an HD treatment.  BP (!) 125/55 (BP Location: Right Arm)   Pulse 84   Temp 98.2 F (36.8 C) (Oral)   Resp 16   Ht 6' (1.829 m)   Wt 77.6 kg   SpO2 97%   BMI 23.19 kg/m   General exam: Appears calm and comfortable Respiratory system: Respiratory effort normal. Cardiovascular system: S1 & S2 heard, Normal rate with regular rhythm Gastrointestinal system: Abdomen is nondistended, soft and nontender. Normal bowel sounds heard. Central nervous system: Alert and oriented to self. Follows some commands but confused Musculoskeletal: No calf tenderness Psychiatry: Judgement and insight appear normal. Mood & affect appropriate.   Brief assessment/Plan:  Community acquired pneumonia Ground-glass attenuation suggests possible infectious etiology. Patient started on Vancomycin/Cefepime/doxycycline. Blood cultures positive and antibiotics transitioned to Cefazolin IV -Continue Cefazolin  Left foot osteomyelitis Left foot cellulitis ID consulted. Bacteremia as mentioned below. ID transitioned patient to Cefazolin IV and recommended orthopedic surgery consult. Orthopedic surgery consulted.  GPC bacteremia Likely secondary to cellulitis/osteomyelitis. ID is consulted. Cardiology is consulted. ID ordered MRI thoracic/lumbar spine.  Sepsis Present on admission. Secondary to above. Antibiotics as mentioned  above.  Hypokalemia Management with HD.  Acute on chronic anemia Patient with what appears to be anemia of chronic illness/CKD. Acute drop in hemoglobin from 8.4 to 7.1 without evidence of bleeding. Repeat CBC ordered and pending. -Follow-up repeat CBC -SCDs  Elevated troponin In setting of sepsis. Cardiology consulted.  Prolonged QTc Elevated troponin Alcoholic cirrhosis Debility GERD Hyperlipidemia Hypertension Diarrhea ESRD -Per H&P  Family communication: Called daughter but no response DVT prophylaxis: SCDs Disposition: Likely back to SNF once inpatient infectious workup/management complete  Cordelia Poche, MD [facility] 02/22/2022, 3:01 PM

## 2022-02-22 NOTE — ED Notes (Signed)
Patient transported to dialysis

## 2022-02-22 NOTE — Evaluation (Signed)
Clinical/Bedside Swallow Evaluation Patient Details  Name: Darryl Diaz MRN: [id number] Date of Birth: 1940/07/24  Today's Date: 02/22/2022 Time: SLP Start Time (ACUTE ONLY): 59 SLP Stop Time (ACUTE ONLY): 5366 SLP Time Calculation (min) (ACUTE ONLY): 15 min  Past Medical History:  Past Medical History:  Diagnosis Date   Alcohol abuse 44/07/4740   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[place]) 03/17/2020   Alcoholism ([place])    in remission following wife's death   Anemia    Anxiety    Ascending aorta dilation ([facility])    Atrial fibrillation ([place])    Cardiac arrest ([place]) 04/20/2019   12 min CPR with epi   Chronic diastolic CHF (congestive heart failure) ([place])    Dysrhythmia    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[place])    Mon Wed Fri   Fatty liver    Fracture of hip ([place]) 03/30/2016   Gastritis    GERD (gastroesophageal reflux disease)    H/O seasonal allergies    Heme positive stool 05/14/2020   History of blood transfusion    History of GI bleed    Hx of adenomatous colonic polyps 08/10/2017   Hyperlipidemia    Hypertension    Insomnia    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   Mallory-Weiss tear    OA (osteoarthritis)    hands   OSA (obstructive sleep apnea)    No longer uses CPAP   Persistent atrial fibrillation with rapid ventricular response ([place]) 02/05/2020   Posterior dislocation of hip, closed ([place]) 04/30/2016   Recurrent syncope    Restless legs syndrome (RLS)    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([place])    Past Surgical History:  Past Surgical History:  Procedure Laterality Date   AMPUTATION Left 05/19/2021   Procedure: LEFT GREAT TOE AMPUTATION;  Surgeon: Newt Minion, MD;  Location: [place];  Service: Orthopedics;  Laterality: Left;   ANKLE SURGERY Left 2014   had rods put in    AV FISTULA PLACEMENT Left 04/21/2021   Procedure: LEFT ARM ARTERIOVENOUS (AV) FISTULA CREATION - Left brachiocephalic fistula;  Surgeon: Marty Heck, MD;  Location:  [place];  Service: Vascular;  Laterality: Left;   BIOPSY  02/28/2019   Procedure: BIOPSY;  Surgeon: Milus Banister, MD;  Location: [facility] [place];  Service: Endoscopy;;   CARDIOVERSION  02/06/2020   CARDIOVERSION N/A 02/06/2020   Procedure: CARDIOVERSION;  Surgeon: Werner Lean, MD;  Location: [facility] ENDOSCOPY;  Service: Cardiovascular;  Laterality: N/A;   CARDIOVERSION N/A 02/24/2020   Procedure: CARDIOVERSION;  Surgeon: Werner Lean, MD;  Location: [place];  Service: Cardiovascular;  Laterality: N/A;   COLONOSCOPY     [place]   COLONOSCOPY WITH PROPOFOL N/A 01/08/2021   Procedure: COLONOSCOPY WITH PROPOFOL;  Surgeon: Milus Banister, MD;  Location: [facility] ENDOSCOPY;  Service: Endoscopy;  Laterality: N/A;   ESOPHAGOGASTRODUODENOSCOPY (EGD) WITH PROPOFOL N/A 02/28/2019   Procedure: ESOPHAGOGASTRODUODENOSCOPY (EGD) WITH PROPOFOL;  Surgeon: Milus Banister, MD;  Location: [facility] ENDOSCOPY;  Service: Endoscopy;  Laterality: N/A;   FRACTURE SURGERY     HIP ARTHROPLASTY Left 04/01/2016   Procedure: ARTHROPLASTY BIPOLAR HIP (HEMIARTHROPLASTY);  Surgeon: Paralee Cancel, MD;  Location: [place];  Service: Orthopedics;  Laterality: Left;   HIP CLOSED REDUCTION Left 04/30/2016   Procedure: CLOSED REDUCTION HIP;  Surgeon: Wylene Simmer, MD;  Location: [place];  Service: Orthopedics;  Laterality: Left;   HOT HEMOSTASIS N/A 01/08/2021   Procedure: HOT HEMOSTASIS (ARGON PLASMA COAGULATION/BICAP);  Surgeon: Ardis Hughs,  Melene Plan, MD;  Location[facility] END[facility]COPY;  Service: Endoscopy;  Laterality: N/A;   INSERTION OF DIALYSIS CATHETER N/A 01/25/2022   Procedure: INSERTION OF TUNNELED DIALYSIS CATHETER;  Surgeon: Angelia Mould, MD;  Location: [place];  Service: Vascular;  Laterality: N/A;   IR RADIOLOGIST EVAL & MGMT  12/31/2020   JOINT REPLACEMENT     TONSILLECTOMY     TOTAL HIP ARTHROPLASTY Right 10/15/2019   Procedure: TOTAL HIP ARTHROPLASTY ANTERIOR APPROACH;  Surgeon: Paralee Cancel, MD;   Location: [facility];  Service: Orthopedics;  Laterality: Right;  70 mins   VASCULAR SURGERY     HPI:    Pt is an 81 y/o M presented from SNF for decreased PO Intake, swelling and redness of the left foot.  Recent admit 9/15 after fall at home with C69f, continues to wear C-collar. PMH includes alcohol abuse, ESRD on HD MWF, CHF, HTN, and HLD.   Assessment / Plan / Recommendation  Clinical Impression  Patient presents with clinical s/s of dysphagia which appear to be pharyngoesophageal or esophageal. He was only willing to drink sips of water and declined all other solids. In addition, patient started crying in pain when SLP attempting to elevate head of bed slightly and so positioning was not ideal for PO intake. He did perform multiple swallows with liquids (1-2) and occasional belching but no overt s/s aspiration or penetration. SLP recommending to continue on current diet and will f/u at least one time to ensure toleration. SLP Visit Diagnosis: Dysphagia, unspecified (R13.10)    Aspiration Risk  Mild aspiration risk    Diet Recommendation Dysphagia 3 (Mech soft);Thin liquid   Liquid Administration via: Straw;Cup Medication Administration: Whole meds with liquid Supervision: Staff to assist with self feeding Compensations: Slow rate;Small sips/bites;Minimize environmental distractions Postural Changes: Other (Comment) (sit upright as much as patient tolerates)    Other  Recommendations Oral Care Recommendations: Oral care BID;Staff/trained caregiver to provide oral care    Recommendations for follow up therapy are one component of a multi-disciplinary discharge planning process, led by the attending physician.  Recommendations may be updated based on patient status, additional functional criteria and insurance authorization.  Follow up Recommendations Other (comment) (TBD)      Assistance Recommended at Discharge Frequent or constant Supervision/Assistance  Functional Status Assessment  Patient has had a recent decline in their functional status and demonstrates the ability to make significant improvements in function in a reasonable and predictable amount of time.  Frequency and Duration min 2x/week  1 week       Prognosis Prognosis for Safe Diet Advancement: Fair Barriers to Reach Goals: Cognitive deficits;Behavior      Swallow Study   General      Oral/Motor/Sensory Function Overall Oral Motor/Sensory Function: Within functional limits   Ice Chips     Thin Liquid Thin Liquid: Impaired Presentation: Straw Pharyngeal  Phase Impairments: Multiple swallows;Other (comments);Suspected delayed Swallow Other Comments: some belching after sips of water    Nectar Thick     Honey Thick     Puree Puree: Not tested   Solid     Solid: Not tested     JSonia Baller MA, CCC-SLP Speech Therapy

## 2022-02-22 NOTE — Assessment & Plan Note (Signed)
Left foot appears cellulitic w swelling and warmth Obtain plain images  check for MRSA  contiune cefepime and vanc for now

## 2022-02-22 NOTE — Assessment & Plan Note (Addendum)
Will notify nephrology that pt has been admitted No acute indication for HD today

## 2022-02-22 NOTE — Assessment & Plan Note (Signed)
Chronic Weight this MELD score 20.  Continue to monitor no longer drinks

## 2022-02-22 NOTE — Consult Note (Addendum)
I have seen and examined the patient. I have personally reviewed the clinical findings, laboratory findings, microbiological data and imaging studies. The assessment and treatment plan was discussed with the Nurse Practitioner Darryl [facility]. I agree with her/his recommendations except following additions/corrections.  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, negative Hep B and C serology   Exam limited due to patient being seen in HD and will re-examine  Lying in the bed and appears comfortable , awake, alert and oriented and appropriately following commands C collar in neck Heart and lungs sounds - Normal rate and irregular rhythm and normal breath sounds, on room air, no increased work of breathing  Abdomen - soft, non tender and non distended Skin - multiple bruises, left AVF with active ongoing HD with no reported pain or tenderness or erythema Back not examined, Left great toe amputation   Continue cefazolin  Fu repeat 2 sets of blood cultures TEE if safe per Cardiology given cervical # Ortho consult given 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 [place], [place] [address] Phone: [phone]  Fax: [place] for Infectious Disease    Date of Admission:  02/21/2022     Total days of antibiotics 2               Reason for Consult: MSSA Bacteremia   Referring Provider: Tivis Diaz / [employer] Primary Care Provider: [name], MD   ASSESSMENT:  [name] is an 81 y/o male with ESRD on HD and recent history of C1 fracture in a cervical collar presenting to the ED following a fall and noted  to have a fever prior  to discharge and started on doxycycline who returned to the ED with weakness and diarrhea found to have MSSA bacteremia of unclear source.  Increased back pain in his lumbar spine and will evaluate for discitis/osteomyelitis. Dialysis graft appears to be functioning and without any evidence of infection. Unlikely pneumonia given presentation and urine culture is pending. Repeat blood cultures from 02/21/22 have been without growth to date and will continue to monitor. TTE without evidence of endocarditis and may need TEE.   PLAN:  Continue Cefazolin. Repeat blood cultures for clearance of bacteremia TEE to exclude endocarditis if deemed safe per Cardiology  Imaging of lumbar and thoracic spine to rule out osteomyelitis/discitis. Remaining medical and supportive are per primary team and Nephrology.    Principal Problem:   Sepsis ([place]) Active Problems:   Hypertension   Hyperlipidemia   GERD (gastroesophageal reflux disease)   Paroxysmal atrial fibrillation (HCC)   Debility   Diarrhea   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HCC)   CAD (coronary artery disease)   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HCC)   Acute hyponatremia   Elevated troponin   Cellulitis   Prolonged QT interval   CAP (community acquired pneumonia)    amiodarone  200 mg Oral q morning   Chlorhexidine Gluconate Cloth  6 each Topical Q0600   escitalopram  10 mg Oral q morning   sodium chloride flush  3 mL Intravenous Q12H   tamsulosin  0.4 mg Oral QHS  HPI: Darryl Diaz is a 81 y.o. 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  [name] has a history of falling and experienced a fall on 01/21/22 resulting in a C1 fracture and experienced a secondary fall on 02/20/22. Experiencing pain all over.  Head CT with no acute intracranial abnormalities and CT cervical with stable C1 fracture. Chest with no cardiopulmonary disease. Pelvic x-ray with no fracture or dislocation. CT chest from 10/16 with subtle areas in the right upper lobe being non-specific with trace pleural effusions and cirrhosis with portal hypertension. Pending his discharge experienced fever of 101.3. F. Started treatment for possible pneumonia with doxycycline. Returned to ED on 10/16 with weakness and diarrhea and 4 days of dysuria. Started on IV antibiotics with concern for worsening infection. Blood cultures from initial ED visit on 02/20/22 are positive for MSSA. Secondary cultures from 02/21/22 have been without growth to date.  [name] does not any pain around his left upper extremity fistula and appears to be functioning appropriately. Complaining of back pain primarily in his lower back with neck pain that waxes and wanes. He does produce some urine and goes to the bathroom 2-3 times per day. Has sporadic diarrhea.    Review of Systems: Review of Systems  Constitutional:  Negative for chills, fever and weight loss.  Respiratory:  Negative for cough, shortness of breath and wheezing.   Cardiovascular:  Negative for chest pain and leg swelling.  Gastrointestinal:  Negative for abdominal pain, constipation, diarrhea, nausea and vomiting.  Musculoskeletal:  Positive for back pain.  Skin:  Negative for rash.     Past Medical History:  Diagnosis Date   Alcohol abuse 24/40/1027   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[place]) 03/17/2020   Alcoholism ([place])    in remission following wife's death   Anemia    Anxiety    Ascending aorta dilation (HCC)    Atrial fibrillation (HCC)    Cardiac arrest ([place]) 04/20/2019   12 min CPR with epi   Chronic diastolic CHF (congestive heart failure) ([place])    Dysrhythmia    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[place])    Mon Wed Fri   Fatty liver    Fracture of hip ([place]) 03/30/2016    Gastritis    GERD (gastroesophageal reflux disease)    H/O seasonal allergies    Heme positive stool 05/14/2020   History of blood transfusion    History of GI bleed    Hx of adenomatous colonic polyps 08/10/2017   Hyperlipidemia    Hypertension    Insomnia    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) 02/05/2020   Posterior dislocation of hip, closed ([place]) 04/30/2016   Recurrent syncope    Restless legs syndrome (RLS)    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([place])    Past Surgical History:  Procedure Laterality Date   AMPUTATION Left 05/19/2021   Procedure: LEFT GREAT TOE AMPUTATION;  Surgeon: Newt Minion, MD;  Location: [facility];  Service: Orthopedics;  Laterality: Left;   ANKLE SURGERY Left 2014   had rods put in    AV  FISTULA PLACEMENT Left 04/21/2021   Procedure: LEFT ARM ARTERIOVENOUS (AV) FISTULA CREATION - Left brachiocephalic fistula;  Surgeon: Marty Heck, MD;  Location: [facility];  Service: Vascular;  Laterality: Left;   BIOPSY  02/28/2019   Procedure: BIOPSY;  Surgeon: Milus Banister, MD;  Location: [facility];  Service: Endoscopy;;   CARDIOVERSION  02/06/2020   CARDIOVERSION N/A 02/06/2020   Procedure: CARDIOVERSION;  Surgeon: Werner Lean, MD;  Location: [facility]OSCOPY;  Service: Cardiovascular;  Laterality: N/A;   CARDIOVERSION N/A 02/24/2020   Procedure: CARDIOVERSION;  Surgeon: Werner Lean, MD;  Location: [facility];  Service: Cardiovascular;  Laterality: N/A;   COLONOSCOPY     [place]   COLONOSCOPY WITH PROPOFOL N/A 01/08/2021   Procedure: COLONOSCOPY WITH PROPOFOL;  Surgeon: Milus Banister, MD;  Location[facility] ENDOSCOPY;  Service: Endoscopy;  Laterality: N/A;   ESOPHAGOGASTRODUODENOSCOPY (EGD) WITH PROPOFOL N/A 02/28/2019   Procedure:  ESOPHAGOGASTRODUODENOSCOPY (EGD) WITH PROPOFOL;  Surgeon: Milus Banister, MD;  Location: [facility] ENDOSCOPY;  Service: Endoscopy;  Laterality: N/A;   FRACTURE SURGERY     HIP ARTHROPLASTY Left 04/01/2016   Procedure: ARTHROPLASTY BIPOLAR HIP (HEMIARTHROPLASTY);  Surgeon: Paralee Cancel, MD;  Location: [facility];  Service: Orthopedics;  Laterality: Left;   HIP CLOSED REDUCTION Left 04/30/2016   Procedure: CLOSED REDUCTION HIP;  Surgeon: Wylene Simmer, MD;  Location: [facility];  Service: Orthopedics;  Laterality: Left;   HOT HEMOSTASIS N/A 01/08/2021   Procedure: HOT HEMOSTASIS (ARGON PLASMA COAGULATION/BICAP);  Surgeon: Milus Banister, MD;  [facility] END[facility];  Service: Endoscopy;  Laterality: N/A;   INSERTION OF DIALYSIS CATHETER N/A 01/25/2022   Procedure: INSERTION OF TUNNELED DIALYSIS CATHETER;  Surgeon: Angelia Mould, MD;  Location: [facility];  Service: Vascular;  Laterality: N/A;   IR RADIOLOGIST EVAL & MGMT  12/31/2020   JOINT REPLACEMENT     TONSILLECTOMY     TOTAL HIP ARTHROPLASTY Right 10/15/2019   Procedure: TOTAL HIP ARTHROPLASTY ANTERIOR APPROACH;  Surgeon: Paralee Cancel, MD;  Location: [facility];  Service: Orthopedics;  Laterality: Right;  70 mins   VASCULAR SURGERY      Social History   Tobacco Use   Smoking status: Former    Packs/day: 1.00    Years: 20.00    Total pack years: 20.00    Types: Cigarettes   Smokeless tobacco: Never   Tobacco comments:       quit 20 years  smoked on and off for 20 years  Vaping Use   Vaping Use: Never used  Substance Use Topics   Alcohol use: Not Currently    Alcohol/week: 3.0 standard drinks of alcohol    Types: 3 Glasses of wine per week    Comment: h/o heavy use   Drug use: Never    Family History  Problem Relation Age of Onset   Heart disease Mother 21   Hypertension Mother    Arthritis Mother    Heart failure Father 68   Stroke Maternal Aunt    Heart failure Sister    Colon cancer Neg Hx    Esophageal cancer Neg Hx    Stomach  cancer Neg Hx    Rectal cancer Neg Hx     Allergies  Allergen Reactions   Penicillin G Sodium Rash and Other (See Comments)     (tolerates Keflex)  NOT DOCUMENTED ON THE MAR   Penicillins Rash    NOT DOCUMENTED ON THE MAR    OBJECTIVE: Blood pressure 127/62, pulse 87, temperature 97.7  F (36.5 C), temperature source Oral, resp. rate 18, height 6' (1.829 m), weight 77.6 kg, SpO2 94 %.  Physical Exam Constitutional:      General: He is not in acute distress.    Appearance: He is well-developed.     Comments: Lying in bed with head of bed elevated; seen in dialysis.   Cardiovascular:     Rate and Rhythm: Normal rate and regular rhythm.     Heart sounds: Normal heart sounds.  Pulmonary:     Effort: Pulmonary effort is normal.     Breath sounds: Normal breath sounds.  Skin:    General: Skin is warm and dry.  Neurological:     Mental Status: He is alert and oriented to person, place, and time.     Lab Results Lab Results  Component Value Date   WBC 7.9 02/21/2022   HGB 7.1 (L) 02/22/2022   HCT 21.0 (L) 02/22/2022   MCV 100.8 (H) 02/21/2022   PLT 83 (L) 02/21/2022    Lab Results  Component Value Date   CREATININE 2.32 (H) 02/22/2022   BUN 24 (H) 02/22/2022   NA 135 02/22/2022   K 2.9 (L) 02/22/2022   CL 100 02/22/2022   CO2 25 02/22/2022    Lab Results  Component Value Date   ALT 19 02/22/2022   AST 37 02/22/2022   ALKPHOS 62 02/22/2022   BILITOT 1.6 (H) 02/22/2022     Microbiology: Recent Results (from the past 240 hour(s))  SARS Coronavirus 2 by RT PCR (hospital order, performed in [facility] hospital lab) *cepheid single result test* Anterior Nasal Swab     Status: None   Collection Time: 02/21/22  3:16 AM   Specimen: Anterior Nasal Swab  Result Value Ref Range Status   SARS Coronavirus 2 by RT PCR NEGATIVE NEGATIVE Final    Comment: (NOTE) SARS-CoV-2 target nucleic acids are NOT DETECTED.  The SARS-CoV-2 RNA is generally detectable in upper and  lower respiratory specimens during the acute phase of infection. The lowest concentration of SARS-CoV-2 viral copies this assay can detect is 250 copies / mL. A negative result does not preclude SARS-CoV-2 infection and should not be used as the sole basis for treatment or other patient management decisions.  A negative result may occur with improper specimen collection / handling, submission of specimen other than nasopharyngeal swab, presence of viral mutation(s) within the areas targeted by this assay, and inadequate number of viral copies (<250 copies / mL). A negative result must be combined with clinical observations, patient history, and epidemiological information.  Fact Sheet for Patients:   https://www.patel.info/  Fact Sheet for Healthcare Providers: https://hall.com/  This test is not yet approved or  cleared by the [place] FDA and has been authorized for detection and/or diagnosis of SARS-CoV-2 by FDA under an Emergency Use Authorization (EUA).  This EUA will remain in effect (meaning this test can be used) for the duration of the COVID-19 declaration under Section 564(b)(1) of the Act, 21 U.S.C. section 360bbb-3(b)(1), unless the authorization is terminated or revoked sooner.  Performed at [facility], [place] [address] [place], [place] [address]   Blood culture (routine x 2)     Status: Abnormal (Preliminary result)   Collection Time: 02/21/22  4:07 AM   Specimen: BLOOD RIGHT HAND  Result Value Ref Range Status   Specimen Description BLOOD RIGHT HAND  Final   Special Requests AEROBIC BOTTLE ONLY Blood Culture adequate volume  Final  Culture  Setup Time   Final    GRAM POSITIVE COCCI IN CLUSTERS AEROBIC BOTTLE ONLY CRITICAL RESULT CALLED TO, READ BACK BY AND VERIFIED WITH: PHARM CATHY PEARCE ON 02/21/22 @ 2022 BY DRT    Culture (A)  Final    STAPHYLOCOCCUS AUREUS SUSCEPTIBILITIES TO FOLLOW Performed at  [facility], [place] [address], [place], [place] [address]    Report Status PENDING  Incomplete  Blood Culture ID Panel (Reflexed)     Status: Abnormal   Collection Time: 02/21/22  4:07 AM  Result Value Ref Range Status   Enterococcus faecalis NOT DETECTED NOT DETECTED Final   Enterococcus Faecium NOT DETECTED NOT DETECTED Final   Listeria monocytogenes NOT DETECTED NOT DETECTED Final   Staphylococcus species DETECTED (A) NOT DETECTED Final    Comment: CRITICAL RESULT CALLED TO, READ BACK BY AND VERIFIED WITH: PHARM CATHY PEARCE ON 02/21/22 @ 2022 BY DRT    Staphylococcus aureus (BCID) DETECTED (A) NOT DETECTED Final    Comment: CRITICAL RESULT CALLED TO, READ BACK BY AND VERIFIED WITH: PHARM CATHY PEARCE ON 02/21/22 @ 2022 BY DRT    Staphylococcus epidermidis NOT DETECTED NOT DETECTED Final   Staphylococcus lugdunensis NOT DETECTED NOT DETECTED Final   Streptococcus species NOT DETECTED NOT DETECTED Final   Streptococcus agalactiae NOT DETECTED NOT DETECTED Final   Streptococcus pneumoniae NOT DETECTED NOT DETECTED Final   Streptococcus pyogenes NOT DETECTED NOT DETECTED Final   A.calcoaceticus-baumannii NOT DETECTED NOT DETECTED Final   Bacteroides fragilis NOT DETECTED NOT DETECTED Final   Enterobacterales NOT DETECTED NOT DETECTED Final   Enterobacter cloacae complex NOT DETECTED NOT DETECTED Final   Escherichia coli NOT DETECTED NOT DETECTED Final   Klebsiella aerogenes NOT DETECTED NOT DETECTED Final   Klebsiella oxytoca NOT DETECTED NOT DETECTED Final   Klebsiella pneumoniae NOT DETECTED NOT DETECTED Final   Proteus species NOT DETECTED NOT DETECTED Final   Salmonella species NOT DETECTED NOT DETECTED Final   Serratia marcescens NOT DETECTED NOT DETECTED Final   Haemophilus influenzae NOT DETECTED NOT DETECTED Final   Neisseria meningitidis NOT DETECTED NOT DETECTED Final   Pseudomonas aeruginosa NOT DETECTED NOT DETECTED Final   Stenotrophomonas maltophilia NOT  DETECTED NOT DETECTED Final   Candida albicans NOT DETECTED NOT DETECTED Final   Candida auris NOT DETECTED NOT DETECTED Final   Candida glabrata NOT DETECTED NOT DETECTED Final   Candida krusei NOT DETECTED NOT DETECTED Final   Candida parapsilosis NOT DETECTED NOT DETECTED Final   Candida tropicalis NOT DETECTED NOT DETECTED Final   Cryptococcus neoformans/gattii NOT DETECTED NOT DETECTED Final   Meth resistant mecA/C and MREJ NOT DETECTED NOT DETECTED Final    Comment: Performed at [facility], [address] [place], [place] [address]  Blood culture (routine x 2)     Status: Abnormal (Preliminary result)   Collection Time: 02/21/22  6:19 AM   Specimen: BLOOD LEFT HAND  Result Value Ref Range Status   Specimen Description BLOOD LEFT HAND  Final   Special Requests AEROBIC BOTTLE ONLY Blood Culture adequate volume  Final   Culture  Setup Time   Final    GRAM POSITIVE COCCI IN CLUSTERS AEROBIC BOTTLE ONLY CRITICAL VALUE NOTED.  VALUE IS CONSISTENT WITH PREVIOUSLY REPORTED AND CALLED VALUE. Performed at [facility], [place] [address], [place], [place] [address]    Culture STAPHYLOCOCCUS AUREUS (A)  Final   Report Status PENDING  Incomplete  Blood Culture (routine x 2)  Status: None (Preliminary result)   Collection Time: 02/21/22  8:42 PM   Specimen: BLOOD RIGHT FOREARM  Result Value Ref Range Status   Specimen Description BLOOD RIGHT FOREARM  Final   Special Requests   Final    BOTTLES DRAWN AEROBIC AND ANAEROBIC Blood Culture adequate volume   Culture   Final    NO GROWTH < 12 HOURS Performed at [facility], [address], [place], [place] [address]    Report Status PENDING  Incomplete  Blood Culture (routine x 2)     Status: None (Preliminary result)   Collection Time: 02/21/22  8:49 PM   Specimen: BLOOD  Result Value Ref Range Status   Specimen Description BLOOD RIGHT ANTECUBITAL  Final   Special Requests   Final    BOTTLES DRAWN AEROBIC AND  ANAEROBIC Blood Culture adequate volume   Culture   Final    NO GROWTH < 12 HOURS Performed at [facility], [place] [address], [place], [place] [address]    Report Status PENDING  Incomplete  Resp Panel by RT-PCR (Flu A&B, Covid) Anterior Nasal Swab     Status: None   Collection Time: 02/22/22  3:28 AM   Specimen: Anterior Nasal Swab  Result Value Ref Range Status   SARS Coronavirus 2 by RT PCR NEGATIVE NEGATIVE Final    Comment: (NOTE) SARS-CoV-2 target nucleic acids are NOT DETECTED.  The SARS-CoV-2 RNA is generally detectable in upper respiratory specimens during the acute phase of infection. The lowest concentration of SARS-CoV-2 viral copies this assay can detect is 138 copies/mL. A negative result does not preclude SARS-Cov-2 infection and should not be used as the sole basis for treatment or other patient management decisions. A negative result may occur with  improper specimen collection/handling, submission of specimen other than nasopharyngeal swab, presence of viral mutation(s) within the areas targeted by this assay, and inadequate number of viral copies(<138 copies/mL). A negative result must be combined with clinical observations, patient history, and epidemiological information. The expected result is Negative.  Fact Sheet for Patients:  EntrepreneurPulse.com.au  Fact Sheet for Healthcare Providers:  IncredibleEmployment.be  This test is no t yet approved or cleared by the [place] FDA and  has been authorized for detection and/or diagnosis of SARS-CoV-2 by FDA under an Emergency Use Authorization (EUA). This EUA will remain  in effect (meaning this test can be used) for the duration of the COVID-19 declaration under Section 564(b)(1) of the Act, 21 U.S.C.section 360bbb-3(b)(1), unless the authorization is terminated  or revoked sooner.       Influenza A by PCR NEGATIVE NEGATIVE Final   Influenza B by PCR NEGATIVE  NEGATIVE Final    Comment: (NOTE) The Xpert Xpress SARS-CoV-2/FLU/RSV plus assay is intended as an aid in the diagnosis of influenza from Nasopharyngeal swab specimens and should not be used as a sole basis for treatment. Nasal washings and aspirates are unacceptable for Xpert Xpress SARS-CoV-2/FLU/RSV testing.  Fact Sheet for Patients: EntrepreneurPulse.com.au  Fact Sheet for Healthcare Providers: IncredibleEmployment.be  This test is not yet approved or cleared by the [place] FDA and has been authorized for detection and/or diagnosis of SARS-CoV-2 by FDA under an Emergency Use Authorization (EUA). This EUA will remain in effect (meaning this test can be used) for the duration of the COVID-19 declaration under Section 564(b)(1) of the Act, 21 U.S.C. section 360bbb-3(b)(1), unless the authorization is terminated or revoked.  Performed at [facility], [place]  [address], [place], [place] [address]   Respiratory (~20 pathogens) panel by PCR     Status: None   Collection Time: 02/22/22  3:28 AM   Specimen: Urine, Clean Catch; Respiratory  Result Value Ref Range Status   Adenovirus NOT DETECTED NOT DETECTED Final   Coronavirus 229E NOT DETECTED NOT DETECTED Final    Comment: (NOTE) The Coronavirus on the Respiratory Panel, DOES NOT test for the novel  Coronavirus (2019 nCoV)    Coronavirus HKU1 NOT DETECTED NOT DETECTED Final   Coronavirus NL63 NOT DETECTED NOT DETECTED Final   Coronavirus OC43 NOT DETECTED NOT DETECTED Final   Metapneumovirus NOT DETECTED NOT DETECTED Final   Rhinovirus / Enterovirus NOT DETECTED NOT DETECTED Final   Influenza A NOT DETECTED NOT DETECTED Final   Influenza B NOT DETECTED NOT DETECTED Final   Parainfluenza Virus 1 NOT DETECTED NOT DETECTED Final   Parainfluenza Virus 2 NOT DETECTED NOT DETECTED Final   Parainfluenza Virus 3 NOT DETECTED NOT DETECTED Final   Parainfluenza Virus 4 NOT DETECTED NOT  DETECTED Final   Respiratory Syncytial Virus NOT DETECTED NOT DETECTED Final   Bordetella pertussis NOT DETECTED NOT DETECTED Final   Bordetella Parapertussis NOT DETECTED NOT DETECTED Final   Chlamydophila pneumoniae NOT DETECTED NOT DETECTED Final   Mycoplasma pneumoniae NOT DETECTED NOT DETECTED Final    Comment: Performed at [facility], [place]. [address] [place], [place] [address]  MRSA Next Gen by PCR, Nasal     Status: None   Collection Time: 02/22/22  3:33 AM   Specimen: STOOL; Nasal Swab  Result Value Ref Range Status   MRSA by PCR Next Gen NOT DETECTED NOT DETECTED Final    Comment: (NOTE) The GeneXpert MRSA Assay (FDA approved for NASAL specimens only), is one component of a comprehensive MRSA colonization surveillance program. It is not intended to diagnose MRSA infection nor to guide or monitor treatment for MRSA infections. Test performance is not FDA approved in patients less than 69 years old. Performed at [facility], [place] [address], [place], [place] [address]    Imaging ECHOCARDIOGRAM COMPLETE  Result Date: 02/22/2022    ECHOCARDIOGRAM REPORT   Patient Name:   Darryl [facility] Date of Exam: 02/22/2022 Medical Rec #:  [id number]        Height:       72.0 in Accession #:    [id number]       Weight:       171.0 lb Date of Birth:  July 22, 1940        BSA:          1.993 m Patient Age:    60 years         BP:           110/43 mmHg Patient Gender: M                HR:           124 bpm. Exam Location:  Inpatient Procedure: 2D Echo, Cardiac Doppler and Color Doppler Indications:    elevated trops  History:        Patient has prior history of Echocardiogram examinations, most                 recent 01/17/2021. CAD, Arrythmias:Bradycardia; Risk                 Factors:Hypertension and Dyslipidemia.  Sonographer:    Melissa Morford RDCS (AE, PE) Referring Phys: [id number] ANASTASSIA  DOUTOVA IMPRESSIONS  1. Left ventricular ejection fraction, by estimation, is 60 to 65%. The left  ventricle has normal function. The left ventricle has no regional wall motion abnormalities. Left ventricular diastolic parameters are consistent with Grade I diastolic dysfunction (impaired relaxation). Elevated left ventricular end-diastolic pressure. The E/e' is 68.  2. Right ventricular systolic function is normal. The right ventricular size is normal. There is normal pulmonary artery systolic pressure. The estimated right ventricular systolic pressure is 35.0 mmHg.  3. Left atrial size was moderately dilated.  4. The mitral valve is abnormal. Mild mitral valve regurgitation. Moderate mitral annular calcification.  5. The aortic valve is tricuspid. Aortic valve regurgitation is not visualized. Mild aortic valve stenosis. Aortic valve area, by VTI measures 2.45 cm. Aortic valve mean gradient measures 12.5 mmHg. Aortic valve Vmax measures 2.18 m/s.  6. Aortic dilatation noted. There is borderline dilatation of the ascending aorta, measuring 39 mm.  7. The inferior vena cava is dilated in size with <50% respiratory variability, suggesting right atrial pressure of 15 mmHg.  8. Cannot exclude a small PFO. Comparison(s): Changes from prior study are noted. 01/17/2021: LVEF 60-65%, mild AS - mean gradient 12 mmHg. FINDINGS  Left Ventricle: Left ventricular ejection fraction, by estimation, is 60 to 65%. The left ventricle has normal function. The left ventricle has no regional wall motion abnormalities. The left ventricular internal cavity size was normal in size. There is  no left ventricular hypertrophy. Left ventricular diastolic parameters are consistent with Grade I diastolic dysfunction (impaired relaxation). Elevated left ventricular end-diastolic pressure. The E/e' is 15. Right Ventricle: The right ventricular size is normal. No increase in right ventricular wall thickness. Right ventricular systolic function is normal. There is normal pulmonary artery systolic pressure. The tricuspid regurgitant velocity is  1.63 m/s, and  with an assumed right atrial pressure of 15 mmHg, the estimated right ventricular systolic pressure is 09.3 mmHg. Left Atrium: Left atrial size was moderately dilated. Right Atrium: Right atrial size was normal in size. Pericardium: There is no evidence of pericardial effusion. Mitral Valve: The mitral valve is abnormal. There is moderate calcification of the posterior mitral valve leaflet(s). Moderate mitral annular calcification. Mild mitral valve regurgitation. Tricuspid Valve: The tricuspid valve is grossly normal. Tricuspid valve regurgitation is trivial. Aortic Valve: The aortic valve is tricuspid. Aortic valve regurgitation is not visualized. Mild aortic stenosis is present. Aortic valve mean gradient measures 12.5 mmHg. Aortic valve peak gradient measures 19.0 mmHg. Aortic valve area, by VTI measures 2.45 cm. Pulmonic Valve: The pulmonic valve was grossly normal. Pulmonic valve regurgitation is trivial. Aorta: Aortic dilatation noted. There is borderline dilatation of the ascending aorta, measuring 39 mm. Venous: The inferior vena cava is dilated in size with less than 50% respiratory variability, suggesting right atrial pressure of 15 mmHg. IAS/Shunts: Cannot exclude a small PFO.  LEFT VENTRICLE PLAX 2D LVIDd:         5.33 cm      Diastology LVIDs:         4.13 cm      LV e' medial:    8.92 cm/s LV PW:         0.93 cm      LV E/e' medial:  13.9 LV IVS:        0.87 cm      LV e' lateral:   6.64 cm/s LVOT diam:     2.10 cm      LV E/e' lateral: 18.7 LV SV:  108 LV SV Index:   54 LVOT Area:     3.46 cm  LV Volumes (MOD) LV vol d, MOD A2C: 143.0 ml LV vol d, MOD A4C: 162.0 ml LV vol s, MOD A2C: 70.6 ml LV vol s, MOD A4C: 65.8 ml LV SV MOD A2C:     72.4 ml LV SV MOD A4C:     162.0 ml LV SV MOD BP:      88.1 ml RIGHT VENTRICLE RV S prime:     11.30 cm/s TAPSE (M-mode): 2.6 cm LEFT ATRIUM             Index        RIGHT ATRIUM           Index LA diam:        4.20 cm 2.11 cm/m   RA Area:      14.50 cm LA Vol (A2C):   77.0 ml 38.63 ml/m  RA Volume:   34.20 ml  17.16 ml/m LA Vol (A4C):   87.3 ml 43.80 ml/m LA Biplane Vol: 87.7 ml 44.00 ml/m  AORTIC VALVE AV Area (Vmax):    2.38 cm AV Area (Vmean):   2.31 cm AV Area (VTI):     2.45 cm AV Vmax:           218.00 cm/s AV Vmean:          167.000 cm/s AV VTI:            0.440 m AV Peak Grad:      19.0 mmHg AV Mean Grad:      12.5 mmHg LVOT Vmax:         150.00 cm/s LVOT Vmean:        111.500 cm/s LVOT VTI:          0.310 m LVOT/AV VTI ratio: 0.71  AORTA Ao Root diam: 3.60 cm Ao Asc diam:  3.90 cm MITRAL VALVE                TRICUSPID VALVE MV Area (PHT): 4.80 cm     TR Peak grad:   10.6 mmHg MV Decel Time: 158 msec     TR Vmax:        163.00 cm/s MV E velocity: 124.00 cm/s MV A velocity: 121.00 cm/s  SHUNTS MV E/A ratio:  1.02         Systemic VTI:  0.31 m                             Systemic Diam: 2.10 cm Lyman Bishop MD Electronically signed by Lyman Bishop MD Signature Date/Time: 02/22/2022/11:01:26 AM    Final    DG Foot 2 Views Left  Result Date: 02/22/2022 CLINICAL DATA:  Cellulitis EXAM: LEFT FOOT - 2 VIEW COMPARISON:  Radiographs 11/02/2021; MRI 06/08/2021; radiographs 06/07/2021 FINDINGS: 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 Osseous destruction at the base of the second proximal phalanx in the head of the second metatarsal consistent with osteomyelitis. Question osteo myelitis of the medial aspect of the fourth metatarsal head versus projection artifact. Remote posttraumatic deformity about the head of the third metatarsal. 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 Postsurgical changes about the ankle. 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 Question osteomyelitis of the medial aspect of the fourth metatarsal head. Electronically Signed   By: Placido Sou M.D.   On: 02/22/2022 02:04   DG Chest 1  View  Result Date: 02/21/2022 CLINICAL DATA:  Weakness EXAM: CHEST  1 VIEW COMPARISON:   02/20/2022 FINDINGS: Stable cardiomediastinal contours. Slightly low lung volumes with bibasilar atelectasis. Trace effusions. No pneumothorax. Advanced degenerative changes of the left glenohumeral joint. IMPRESSION: Bibasilar atelectasis and trace bilateral pleural effusions. Electronically Signed   By: Davina Poke D.O.   On: 02/21/2022 20:42   CT CHEST ABDOMEN PELVIS WO CONTRAST  Result Date: 02/21/2022 CLINICAL DATA:  81 year old male with history of unintended weight loss and fever. EXAM: CT CHEST, ABDOMEN AND PELVIS WITHOUT CONTRAST TECHNIQUE: Multidetector CT imaging of the chest, abdomen and pelvis was performed following the standard protocol without IV contrast. RADIATION DOSE REDUCTION: This exam was performed according to the departmental dose-optimization program which includes automated exposure control, adjustment of the mA and/or kV according to patient size and/or use of iterative reconstruction technique. COMPARISON:  CT of the abdomen and pelvis 11/19/2021. No prior chest CT. FINDINGS: CT CHEST FINDINGS Cardiovascular: Heart size is normal. There is no significant pericardial fluid, thickening or pericardial calcification. There is aortic atherosclerosis, as well as atherosclerosis of the great vessels of the mediastinum and the coronary arteries, including calcified atherosclerotic plaque in the left main, left anterior descending, left circumflex and right coronary arteries. Mild calcifications of the mitral annulus. Moderate calcifications of the aortic valve. Mediastinum/Nodes: No pathologically enlarged mediastinal or hilar lymph nodes. Please note that accurate exclusion of hilar adenopathy is limited on noncontrast CT scans. Esophagus is unremarkable in appearance. No axillary lymphadenopathy. Lungs/Pleura: Trace bilateral pleural effusions lying dependently with minimal passive subsegmental atelectasis in the lower lobes of the lungs bilaterally. There is some very mild patchy  areas of ground-glass attenuation in the right upper lobe, nonspecific, but potentially of infectious or inflammatory etiology. No confluent consolidative airspace disease. No definite suspicious appearing pulmonary nodules or masses are noted. Musculoskeletal: Multiple old healed right-sided rib fractures are incidentally noted. There are no aggressive appearing lytic or blastic lesions noted in the visualized portions of the skeleton. CT ABDOMEN PELVIS FINDINGS Hepatobiliary: Liver has a shrunken appearance and nodular contour, indicative of advanced cirrhosis. No discrete cystic or solid hepatic lesions are confidently identified on today's noncontrast CT examination. Multiple tiny calcified gallstones lie dependently in the gallbladder. Gallbladder is moderately distended. Gallbladder wall does not appear thickened or edematous. No definite pericholecystic fluid. Pancreas: No definite pancreatic mass or peripancreatic fluid collections or inflammatory changes are noted on today's noncontrast CT examination. Spleen: Spleen appears mildly enlarged measuring 11.5 x 7.7 x 12.1 cm (estimated splenic volume of 536 mL) . Adrenals/Urinary Tract: Mild bilateral renal atrophy. In the lower pole of the left kidney there is a partially exophytic low-attenuation 1.7 cm lesion (axial image 78 of series 3) which is incompletely characterized on today's noncontrast CT examination, but similar to prior studies and statistically likely a small cysts (no imaging follow-up is recommended). Unenhanced appearance of the right kidney and bilateral adrenal glands is otherwise unremarkable. No hydroureteronephrosis. Urinary bladder is largely obscured by beam hardening artifact from the patient's bilateral hip arthroplasties. Stomach/Bowel: Unenhanced appearance of the stomach is normal. There is no pathologic dilatation of small bowel or colon. A few scattered colonic diverticuli are noted, without definite focal surrounding  inflammatory changes to indicate an acute diverticulitis at this time. Normal appendix. Vascular/Lymphatic: Atherosclerotic calcifications throughout the abdominal aorta and pelvic vasculature. Portal vein appears mildly dilated measuring 17 mm in the porta hepatis. No lymphadenopathy noted in the abdomen or pelvis. Reproductive: Prostate gland  and seminal vesicles are obscured by beam hardening artifact. Other: No significant volume of ascites.  No pneumoperitoneum. Musculoskeletal: There are no aggressive appearing lytic or blastic lesions noted in the visualized portions of the skeleton. 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 IMPRESSION: 1. No definite findings in the chest, abdomen or pelvis to account for the patient's unexplained weight loss. There are very subtle areas of mild ground-glass attenuation in the right upper lobe which are nonspecific, but potentially of infectious or inflammatory etiology. 2. Trace bilateral pleural effusions lying dependently. 3. Cholelithiasis without evidence of acute cholecystitis. 4. 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 5. Colonic diverticulosis without evidence of acute diverticulitis at this time. 6. Aortic atherosclerosis, in addition to left main and three-vessel coronary artery disease. 7. There are calcifications of the aortic valve and mitral annulus. Echocardiographic correlation for evaluation of potential valvular dysfunction may be warranted if clinically indicated. 8. Additional incidental findings, as above. Electronically Signed   By: Vinnie Langton M.D.   On: 02/21/2022 06:26   DG Chest 2 View  Result Date: 02/20/2022 CLINICAL DATA:  Fall. EXAM: CHEST - 2 VIEW COMPARISON:  Chest x-ray 01/25/2022 FINDINGS: The heart size and mediastinal contours are within normal limits. There is minimal atelectasis in the lung bases. Lung  volumes are low. Visualized skeletal structures are unremarkable. IMPRESSION: No active cardiopulmonary disease. Electronically Signed   By: Ronney Asters M.D.   On: 02/20/2022 23:07   DG Pelvis Portable  Result Date: 02/20/2022 CLINICAL DATA:  Fall. EXAM: PORTABLE PELVIS 1-2 VIEWS COMPARISON:  06/05/2021. FINDINGS: There is no evidence of pelvic fracture or diastasis. Bilateral hip arthroplasty changes are noted. There is no evidence of hardware loosening. Degenerative changes are noted in the lower lumbar spine. IMPRESSION: No acute fracture or dislocation. Electronically Signed   By: Brett Fairy M.D.   On: 02/20/2022 23:05   CT Head Wo Contrast  Result Date: 02/20/2022 CLINICAL DATA:  Fall EXAM: CT HEAD WITHOUT CONTRAST CT CERVICAL SPINE WITHOUT CONTRAST TECHNIQUE: Multidetector CT imaging of the head and cervical spine was performed following the standard protocol without intravenous contrast. Multiplanar CT image reconstructions of the cervical spine were also generated. RADIATION DOSE REDUCTION: This exam was performed according to the departmental dose-optimization program which includes automated exposure control, adjustment of the mA and/or kV according to patient size and/or use of iterative reconstruction technique. COMPARISON:  01/31/2022 FINDINGS: Motion degraded images. CT HEAD FINDINGS Brain: No evidence of acute infarction, hemorrhage, hydrocephalus, extra-axial collection or mass lesion/mass effect. Global cortical and central atrophy. Subcortical white matter and periventricular small vessel ischemic changes. Vascular: Intracranial atherosclerosis. Skull: Normal. Negative for fracture or focal lesion. Sinuses/Orbits: The visualized paranasal sinuses are essentially clear. The mastoid air cells are unopacified. Other: None. CT CERVICAL SPINE FINDINGS Alignment: Straightening the cervical spine, likely positional. Skull base and vertebrae: Stable fracture involving the posterior aspect of  the right superior articular facet of C1 (series 3/image 13). No new/acute fracture. Soft tissues and spinal canal: No prevertebral fluid or swelling. No visible canal hematoma. Disc levels: Moderate degenerative changes of the lower cervical spine. Spinal canal is patent. Upper chest: Visualized lung apices are essentially clear. Other: Visualized thyroid is grossly unremarkable. IMPRESSION: No acute intracranial abnormality. Atrophy with small vessel ischemic changes. Stable fracture involving the posterior aspect of the right superior articular facet of C1. No new/acute traumatic injury to the cervical spine. Electronically Signed  By: Julian Hy M.D.   On: 02/20/2022 22:48   CT Cervical Spine Wo Contrast  Result Date: 02/20/2022 CLINICAL DATA:  Fall EXAM: CT HEAD WITHOUT CONTRAST CT CERVICAL SPINE WITHOUT CONTRAST TECHNIQUE: Multidetector CT imaging of the head and cervical spine was performed following the standard protocol without intravenous contrast. Multiplanar CT image reconstructions of the cervical spine were also generated. RADIATION DOSE REDUCTION: This exam was performed according to the departmental dose-optimization program which includes automated exposure control, adjustment of the mA and/or kV according to patient size and/or use of iterative reconstruction technique. COMPARISON:  01/31/2022 FINDINGS: Motion degraded images. CT HEAD FINDINGS Brain: No evidence of acute infarction, hemorrhage, hydrocephalus, extra-axial collection or mass lesion/mass effect. Global cortical and central atrophy. Subcortical white matter and periventricular small vessel ischemic changes. Vascular: Intracranial atherosclerosis. Skull: Normal. Negative for fracture or focal lesion. Sinuses/Orbits: The visualized paranasal sinuses are essentially clear. The mastoid air cells are unopacified. Other: None. CT CERVICAL SPINE FINDINGS Alignment: Straightening the cervical spine, likely positional. Skull base  and vertebrae: Stable fracture involving the posterior aspect of the right superior articular facet of C1 (series 3/image 13). No new/acute fracture. Soft tissues and spinal canal: No prevertebral fluid or swelling. No visible canal hematoma. Disc levels: Moderate degenerative changes of the lower cervical spine. Spinal canal is patent. Upper chest: Visualized lung apices are essentially clear. Other: Visualized thyroid is grossly unremarkable. IMPRESSION: No acute intracranial abnormality. Atrophy with small vessel ischemic changes. Stable fracture involving the posterior aspect of the right superior articular facet of C1. No new/acute traumatic injury to the cervical spine. Electronically Signed   By: Julian Hy M.D.   On: 02/20/2022 22:48   CT Head Wo Contrast  Result Date: 01/31/2022 CLINICAL DATA:  Head trauma.  Status post fall. EXAM: CT HEAD WITHOUT CONTRAST CT CERVICAL SPINE WITHOUT CONTRAST TECHNIQUE: Multidetector CT imaging of the head and cervical spine was performed following the standard protocol without intravenous contrast. Multiplanar CT image reconstructions of the cervical spine were also generated. RADIATION DOSE REDUCTION: This exam was performed according to the departmental dose-optimization program which includes automated exposure control, adjustment of the mA and/or kV according to patient size and/or use of iterative reconstruction technique. COMPARISON:  01/21/2022 FINDINGS: CT HEAD FINDINGS Brain: No evidence of acute infarction, hemorrhage, hydrocephalus, extra-axial collection or mass lesion/mass effect. There is mild diffuse low-attenuation within the subcortical and periventricular white matter compatible with chronic microvascular disease. Prominence of the sulci and ventricles compatible with brain atrophy. Vascular: No hyperdense vessel or unexpected calcification. Skull: Normal. Negative for fracture or focal lesion. Sinuses/Orbits: Paranasal sinuses and mastoid air  cells are clear. The orbits are unremarkable. Other: Left frontal scalp hematoma measures 0.8 x 2.5 cm, image 14/4. CT CERVICAL SPINE FINDINGS Alignment: No signs of acute posttraumatic mile alignment of the cervical spine. There is reversal of normal cervical lordosis which may reflect patient positioning or muscle spasm. Skull base and vertebrae: Stable appearance of minimally displaced fracture involving the C1 right superior articular process where it contacts the occipital condyle image 63/12. stable from the prior exam. Soft tissues and spinal canal: No prevertebral fluid or swelling. No visible canal hematoma. Disc levels: There is multi level disc space narrowing and endplate spurring identified compatible with degenerative disc disease. Upper chest: Negative. Other: None IMPRESSION: 1. No acute intracranial abnormality. 2. Left frontal scalp hematoma without evidence for underlying skull fracture. 3. Chronic microvascular disease and brain atrophy. 4. No evidence for acute cervical spine  fracture or subluxation. 5. Stable appearance of minimally displaced fracture involving the right superior articular process of C1. 6. Cervical degenerative disc disease. Electronically Signed   By: Kerby Moors M.D.   On: 01/31/2022 22:21   CT Cervical Spine Wo Contrast  Result Date: 01/31/2022 CLINICAL DATA:  Head trauma.  Status post fall. EXAM: CT HEAD WITHOUT CONTRAST CT CERVICAL SPINE WITHOUT CONTRAST TECHNIQUE: Multidetector CT imaging of the head and cervical spine was performed following the standard protocol without intravenous contrast. Multiplanar CT image reconstructions of the cervical spine were also generated. RADIATION DOSE REDUCTION: This exam was performed according to the departmental dose-optimization program which includes automated exposure control, adjustment of the mA and/or kV according to patient size and/or use of iterative reconstruction technique. COMPARISON:  01/21/2022 FINDINGS: CT HEAD  FINDINGS Brain: No evidence of acute infarction, hemorrhage, hydrocephalus, extra-axial collection or mass lesion/mass effect. There is mild diffuse low-attenuation within the subcortical and periventricular white matter compatible with chronic microvascular disease. Prominence of the sulci and ventricles compatible with brain atrophy. Vascular: No hyperdense vessel or unexpected calcification. Skull: Normal. Negative for fracture or focal lesion. Sinuses/Orbits: Paranasal sinuses and mastoid air cells are clear. The orbits are unremarkable. Other: Left frontal scalp hematoma measures 0.8 x 2.5 cm, image 14/4. CT CERVICAL SPINE FINDINGS Alignment: No signs of acute posttraumatic mile alignment of the cervical spine. There is reversal of normal cervical lordosis which may reflect patient positioning or muscle spasm. Skull base and vertebrae: Stable appearance of minimally displaced fracture involving the C1 right superior articular process where it contacts the occipital condyle image 63/12. stable from the prior exam. Soft tissues and spinal canal: No prevertebral fluid or swelling. No visible canal hematoma. Disc levels: There is multi level disc space narrowing and endplate spurring identified compatible with degenerative disc disease. Upper chest: Negative. Other: None IMPRESSION: 1. No acute intracranial abnormality. 2. Left frontal scalp hematoma without evidence for underlying skull fracture. 3. Chronic microvascular disease and brain atrophy. 4. No evidence for acute cervical spine fracture or subluxation. 5. Stable appearance of minimally displaced fracture involving the right superior articular process of C1. 6. Cervical degenerative disc disease. Electronically Signed   By: Kerby Moors M.D.   On: 01/31/2022 22:21   DG Chest Port 1 View  Result Date: 01/25/2022 CLINICAL DATA:  End-stage renal disease. EXAM: PORTABLE CHEST 1 VIEW COMPARISON:  Radiographs 01/03/2022 and 12/19/2021. FINDINGS: 1554  hours. The heart size and mediastinal contours are stable. There are lower lung volumes with mild resulting bibasilar atelectasis. No edema, confluent airspace opacity or pneumothorax. There may be a small amount of pleural fluid on the left. A superiorly projecting catheter projects over the superior cavoatrial junction, presumably a femorally placed central venous catheter. Telemetry leads overlie the chest. Old right-sided rib fractures are noted. IMPRESSION: 1. Presumed central venous catheter projecting over the superior cavoatrial junction. 2. Bibasilar atelectasis. Electronically Signed   By: Richardean Sale M.D.   On: 01/25/2022 16:04   DG C-Arm 1-60 Min-No Report  Result Date: 01/25/2022 Fluoroscopy was utilized by the requesting physician.  No radiographic interpretation.      Terri Piedra, NP [facility]  02/22/2022  8:33 AM

## 2022-02-22 NOTE — Assessment & Plan Note (Signed)
Chronic stable given possible sepsis hold Coreg restart Lipitor 20 mg p.o. daily

## 2022-02-22 NOTE — Assessment & Plan Note (Signed)
If able to obtain urine can check urine elctrolytes

## 2022-02-22 NOTE — Progress Notes (Signed)
PT Cancellation Note  Patient Details Name: YOJAN PASKETT MRN: [id number] DOB: [birth year]   Cancelled Treatment:    Reason Eval/Treat Not Completed: Patient at procedure or test/unavailable Pt currently at HD. Will follow up as schedule allows.   Lou Miner, DPT  Acute Rehabilitation Services  Office: [phone]    Rudean Hitt 02/22/2022, 11:13 AM

## 2022-02-22 NOTE — ED Notes (Signed)
Patient transported to MRI 

## 2022-02-22 NOTE — ED Notes (Signed)
Patient reminded about keeping on ccollar. Patient states, "I am not taking it off. Can you fix it?" As patient is pulling on collar. Patient is redirectable at this time

## 2022-02-22 NOTE — Subjective & Objective (Signed)
Patient had a full 15 fall October he had pain all over the sternum he already has known C1 fracture sustained on 15 September wear c-collar. She has history of A-fib but not on anticoagulation secondary to falls and GI bleed. He also has history of end-stage renal disease on hemodialysis Monday Wednesday Friday Initially when patient came in his magnesium was low was repleted she was ready for discharge he had a fever Blood cultures were ordered CT chest was done showing Opacities concerning for pneumonia he was started on doxycycline Initially he was able to be discharged back to facility but returned endorsing generalized fatigue poor appetite dysuria he was also endorsing some diarrhea as well poor p.o. intake And does have some dysuria for past few days Patient has missed his hemodialysis for the past few days as well He has blood cultures drawn the day before showed positive Staphylococcus He was given IV vancomycin

## 2022-02-22 NOTE — ED Notes (Signed)
MRI notified that pt has order for Ativan on call to MRI

## 2022-02-22 NOTE — Assessment & Plan Note (Signed)
Order lipitor '20mg'$  po qday

## 2022-02-22 NOTE — ED Notes (Signed)
Patient transported to Echo.

## 2022-02-22 NOTE — Progress Notes (Signed)
Received patient in bed to unit.  Alert and idsoriented Informed consent signed and in chart.   Treatment initiated:  Treatment completed: 1403  Patient tolerated well.  Transported back to the room  Alert, without acute distress.  Hand-off given to patient's nurse.   Access used: L fistula Access issues: n/a  Total UF removed: 0.8L Medication(s) given: Albumin , Tylenol  Unable to get weight patient is on a stretcher without scale and unable to stand up    [facility] Received patient in bed to unit.  Alert and disoriented Informed consent signed and in chart.   Treatment initiated:  Treatment completed: 1403  Patient tolerated well.  Transported back to the room  Alert, without acute distress.  Hand-off given to patient's nurse.   Access used: L fistula Access issues: n/a  Total UF removed: 0.8 L Medication(s) given: Albumin 2x, Tylenol    [facility]   02/22/22 1403  Vitals  Temp 98.2 F (36.8 C)  Temp Source Oral  BP (!) 125/55  BP Location Right Arm  BP Method Automatic  Patient Position (if appropriate) Lying  Pulse Rate 84  Pulse Rate Source Monitor  Resp 16  Oxygen Therapy  SpO2 97 %  O2 Device Room Air  Patient Activity (if Appropriate) In bed  During Treatment Monitoring  Intra-Hemodialysis Comments Tx completed  Post Treatment  Dialyzer Clearance Lightly streaked  Duration of HD Treatment -hour(s) 4 hour(s)  Liters Processed 76.7  Fluid Removed 0.8 mL  Tolerated HD Treatment Yes

## 2022-02-22 NOTE — Assessment & Plan Note (Signed)
While diarrhea hold protonix

## 2022-02-22 NOTE — Consult Note (Addendum)
[facility] Renal Consultation Note    Indication for Consultation:  Management of ESRD/hemodialysis; anemia, hypertension/volume and secondary hyperparathyroidism  YDX:AJOINOM, [name], MD  HPI: Darryl Diaz is a 81 y.o. male. ESRD on HD TTS at [facility]. Past medical history significant for chronic CHF, HTN, HLD, GERD, OA, a fib, sleep apnea, GI bleed and C1 fx.   Patient presented to ED for generalized weakness along with diarrhea and fever. He was also seen in the ED last night (02/21/2022) for a fall. Patient had a fever and CT scan was concerning for PNA. He was started on doxycycline and discharged to his assisted living facility. He subsequently had worsening generalized weakness and returned to ED. Labs this AM sCr 2.32, BUN 24, CO2 25, K 2.9, Ca 7.9, Phos 2.4, alb 2.4, Hgb 7.1.   Patient was seen while he was receiving HD to continue his current TTS schedule. Patient reports he felt very scared and overwhelmed while in the emergency department this morning but is feeling much better. His diarrhea is improving and he denies any associated nausea/vomiting. He reports his appetite is great and he has been eating more since moving into the assisted living facility. He says hemodialysis treatments have been going well and his treatment today has been good thus far. He does endorse mild frontal HA and requests Tylenol. Denies dizziness, SOB, CP, or extremity edema. Patient was seen in dialysis today.  He has had failure to thrive, many hospitalizations over the last several months-  many falls-  now with GPC bacteremia of unknown etiology -  had missed HD so does not appear to be from that-  has PNA and some minor osteo but still unclear what could be the source-  ID is involved.  He is inappropriately positive about his situation-  possibly with a little bit of dementia  Past Medical History:  Diagnosis Date   Alcohol abuse 76/72/0947   Alcoholic cirrhosis of liver without ascites  ([place]) 03/17/2020   Alcoholism ([place])    in remission following wife's death   Anemia    Anxiety    Ascending aorta dilation (HCC)    Atrial fibrillation (HCC)    Cardiac arrest ([place]) 04/20/2019   12 min CPR with epi   Chronic diastolic CHF (congestive heart failure) ([place])    Dysrhythmia    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[place])    Mon Wed Fri   Fatty liver    Fracture of hip ([place]) 03/30/2016   Gastritis    GERD (gastroesophageal reflux disease)    H/O seasonal allergies    Heme positive stool 05/14/2020   History of blood transfusion    History of GI bleed    Hx of adenomatous colonic polyps 08/10/2017   Hyperlipidemia    Hypertension    Insomnia    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   Mallory-Weiss tear    OA (osteoarthritis)    hands   OSA (obstructive sleep apnea)    No longer uses CPAP   Persistent atrial fibrillation with rapid ventricular response ([place]) 02/05/2020   Posterior dislocation of hip, closed ([place]) 04/30/2016   Recurrent syncope    Restless legs syndrome (RLS)    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([place])    Past Surgical History:  Procedure Laterality Date   AMPUTATION Left 05/19/2021   Procedure: LEFT GREAT TOE AMPUTATION;  Surgeon: Newt Minion, MD;  Location: [facility];  Service: Orthopedics;  Laterality: Left;   ANKLE SURGERY  Left 2014   had rods put in    AV FISTULA PLACEMENT Left 04/21/2021   Procedure: LEFT ARM ARTERIOVENOUS (AV) FISTULA CREATION - Left brachiocephalic fistula;  Surgeon: Marty Heck, MD;  Location: [facility];  Service: Vascular;  Laterality: Left;   BIOPSY  02/28/2019   Procedure: BIOPSY;  Surgeon: Milus Banister, MD;  Location: [facility];  Service: Endoscopy;;   CARDIOVERSION  02/06/2020   CARDIOVERSION N/A 02/06/2020   Procedure: CARDIOVERSION;  Surgeon: Werner Lean, MD;  Location: [facility];  Service: Cardiovascular;  Laterality: N/A;   CARDIOVERSION N/A 02/24/2020   Procedure: CARDIOVERSION;  Surgeon:  Werner Lean, MD;  Location: [facility];  Service: Cardiovascular;  Laterality: N/A;   COLONOSCOPY     [place]   COLONOSCOPY WITH PROPOFOL N/A 01/08/2021   Procedure: COLONOSCOPY WITH PROPOFOL;  Surgeon: Milus Banister, MD;  Location: [facility] ENDOSCOPY;  Service: Endoscopy;  Laterality: N/A;   ESOPHAGOGASTRODUODENOSCOPY (EGD) WITH PROPOFOL N/A 02/28/2019   Procedure: ESOPHAGOGASTRODUODENOSCOPY (EGD) WITH PROPOFOL;  Surgeon: Milus Banister, MD;  Location: [facility] ENDOSCOPY;  Service: Endoscopy;  Laterality: N/A;   FRACTURE SURGERY     HIP ARTHROPLASTY Left 04/01/2016   Procedure: ARTHROPLASTY BIPOLAR HIP (HEMIARTHROPLASTY);  Surgeon: Paralee Cancel, MD;  Location: [facility];  Service: Orthopedics;  Laterality: Left;   HIP CLOSED REDUCTION Left 04/30/2016   Procedure: CLOSED REDUCTION HIP;  Surgeon: Wylene Simmer, MD;  Location: [facility];  Service: Orthopedics;  Laterality: Left;   HOT HEMOSTASIS N/A 01/08/2021   Procedure: HOT HEMOSTASIS (ARGON PLASMA COAGULATION/BICAP);  Surgeon: Milus Banister, MD;  Location[facility]COPY;  Service: Endoscopy;  Laterality: N/A;   INSERTION OF DIALYSIS CATHETER N/A 01/25/2022   Procedure: INSERTION OF TUNNELED DIALYSIS CATHETER;  Surgeon: Angelia Mould, MD;  Location: [facility];  Service: Vascular;  Laterality: N/A;   IR RADIOLOGIST EVAL & MGMT  12/31/2020   JOINT REPLACEMENT     TONSILLECTOMY     TOTAL HIP ARTHROPLASTY Right 10/15/2019   Procedure: TOTAL HIP ARTHROPLASTY ANTERIOR APPROACH;  Surgeon: Paralee Cancel, MD;  Location: [facility];  Service: Orthopedics;  Laterality: Right;  70 mins   VASCULAR SURGERY     Family History  Problem Relation Age of Onset   Heart disease Mother 62   Hypertension Mother    Arthritis Mother    Heart failure Father 21   Stroke Maternal Aunt    Heart failure Sister    Colon cancer Neg Hx    Esophageal cancer Neg Hx    Stomach cancer Neg Hx    Rectal cancer Neg Hx    Social History:  reports that he has quit  smoking. His smoking use included cigarettes. He has a 20.00 pack-year smoking history. He has never used smokeless tobacco. He reports that he does not currently use alcohol after a past usage of about 3.0 standard drinks of alcohol per week. He reports that he does not use drugs. Allergies  Allergen Reactions   Penicillin G Sodium Rash and Other (See Comments)     (tolerates Keflex)  NOT DOCUMENTED ON THE MAR   Penicillins Rash    NOT DOCUMENTED ON THE MAR   Prior to Admission medications   Medication Sig Start Date End Date Taking? Authorizing Provider  amiodarone (PACERONE) 200 MG tablet TAKE 1 TABLET BY MOUTH ONCE DAILY Patient taking differently: Take 200 mg by mouth every morning. 05/17/21  Yes Weaver, Scott T, PA-C  atorvastatin (LIPITOR) 20 MG tablet Take 1 tablet (  20 mg total) by mouth daily. 03/10/20  Yes Angiulli, Lavon Paganini, PA-C  carvedilol (COREG) 6.25 MG tablet Take 1 tablet (6.25 mg total) by mouth 2 (two) times daily. Patient taking differently: Take 12.5 mg by mouth daily. 02/11/21  Yes Sherren Mocha, MD  cetirizine (ZYRTEC) 10 MG tablet Take 10 mg by mouth daily.   Yes [provider]  escitalopram (LEXAPRO) 10 MG tablet Take 1 tablet (10 mg total) by mouth daily. Patient taking differently: Take 10 mg by mouth every morning. 03/10/20  Yes Angiulli, Lavon Paganini, PA-C  ferrous sulfate 324 MG TBEC Take 324 mg by mouth every morning.   Yes [provider]  FOLIC ACID PO Take 1 tablet by mouth every morning.   Yes [provider]  Multiple Vitamin (MULTIVITAMIN PO) Take 1 tablet by mouth every other day.   Yes [provider]  nitroGLYCERIN (NITROSTAT) 0.4 MG SL tablet Place 1 tablet (0.4 mg total) under the tongue every 5 (five) minutes x 3 doses as needed for chest pain. 07/28/20  Yes Baldwin Jamaica, PA-C  pantoprazole (PROTONIX) 40 MG tablet Take 1 tablet (40 mg total) by mouth daily. Patient taking differently: Take 40 mg by mouth every  morning. 03/10/20  Yes Angiulli, Lavon Paganini, PA-C  saccharomyces boulardii (FLORASTOR) 250 MG capsule Take 1 capsule (250 mg total) by mouth 2 (two) times daily. 01/27/22  Yes Regalado, Belkys A, MD  tamsulosin (FLOMAX) 0.4 MG CAPS capsule Take 0.4 mg by mouth at bedtime.   Yes [provider]  thiamine (VITAMIN B-1) 100 MG tablet Take 1 tablet (100 mg total) by mouth daily. 01/28/22  Yes Regalado, Belkys A, MD  torsemide (DEMADEX) 20 MG tablet Take 60 mg by mouth daily.   Yes [provider]  traZODone (DESYREL) 50 MG tablet Take 1 tablet (50 mg total) by mouth at bedtime. 11/26/21  Yes Alma Friendly, MD  doxycycline (VIBRAMYCIN) 100 MG capsule Take 1 capsule (100 mg total) by mouth 2 (two) times daily. One po bid x 7 days Patient not taking: Reported on 02/22/2022 02/21/22   Mesner, Corene Cornea, MD   Current Facility-Administered Medications  Medication Dose Route Frequency Provider Last Rate Last Admin   0.9 %  sodium chloride infusion  250 mL Intravenous PRN Toy Baker, MD       acetaminophen (TYLENOL) tablet 650 mg  650 mg Oral Q6H PRN Toy Baker, MD       Or   acetaminophen (TYLENOL) suppository 650 mg  650 mg Rectal Q6H PRN Doutova, Anastassia, MD       albumin human 25 % solution            alteplase (CATHFLO ACTIVASE) injection 2 mg  2 mg Intracatheter Once PRN Penninger, Ria Comment, PA       amiodarone (PACERONE) tablet 200 mg  200 mg Oral q morning Doutova, Anastassia, MD       anticoagulant sodium citrate solution 5 mL  5 mL Intracatheter PRN Penninger, Ria Comment, PA       ceFAZolin (ANCEF) IVPB 1 g/50 mL premix  1 g Intravenous Q24H [name], MD       Chlorhexidine Gluconate Cloth 2 % PADS 6 each  6 each Topical Q0600 Penninger, Ria Comment, PA       escitalopram (LEXAPRO) tablet 10 mg  10 mg Oral q morning Doutova, Anastassia, MD       heparin injection 1,000 Units  1,000 Units Intracatheter PRN Penninger, Ria Comment, [place]  HYDROcodone-acetaminophen  (NORCO/VICODIN) 5-325 MG per tablet 1-2 tablet  1-2 tablet Oral Q4H PRN Doutova, Anastassia, MD       lidocaine (PF) (XYLOCAINE) 1 % injection 5 mL  5 mL Intradermal PRN Penninger, Ria Comment, PA       lidocaine-prilocaine (EMLA) cream 1 Application  1 Application Topical PRN Penninger, Ria Comment, PA       pentafluoroprop-tetrafluoroeth (GEBAUERS) aerosol 1 Application  1 Application Topical PRN Penninger, Ria Comment, PA       sodium chloride flush (NS) 0.9 % injection 3 mL  3 mL Intravenous Q12H Doutova, Anastassia, MD       sodium chloride flush (NS) 0.9 % injection 3 mL  3 mL Intravenous PRN Doutova, Anastassia, MD       tamsulosin (FLOMAX) capsule 0.4 mg  0.4 mg Oral QHS Doutova, Anastassia, MD       Current Outpatient Medications  Medication Sig Dispense Refill   amiodarone (PACERONE) 200 MG tablet TAKE 1 TABLET BY MOUTH ONCE DAILY (Patient taking differently: Take 200 mg by mouth every morning.) 90 tablet 2   atorvastatin (LIPITOR) 20 MG tablet Take 1 tablet (20 mg total) by mouth daily. 30 tablet 0   carvedilol (COREG) 6.25 MG tablet Take 1 tablet (6.25 mg total) by mouth 2 (two) times daily. (Patient taking differently: Take 12.5 mg by mouth daily.) 180 tablet 3   cetirizine (ZYRTEC) 10 MG tablet Take 10 mg by mouth daily.     escitalopram (LEXAPRO) 10 MG tablet Take 1 tablet (10 mg total) by mouth daily. (Patient taking differently: Take 10 mg by mouth every morning.) 30 tablet 0   ferrous sulfate 324 MG TBEC Take 324 mg by mouth every morning.     FOLIC ACID PO Take 1 tablet by mouth every morning.     Multiple Vitamin (MULTIVITAMIN PO) Take 1 tablet by mouth every other day.     nitroGLYCERIN (NITROSTAT) 0.4 MG SL tablet Place 1 tablet (0.4 mg total) under the tongue every 5 (five) minutes x 3 doses as needed for chest pain. 30 tablet 12   pantoprazole (PROTONIX) 40 MG tablet Take 1 tablet (40 mg total) by mouth daily. (Patient taking differently: Take 40 mg by mouth every morning.) 30 tablet 0    saccharomyces boulardii (FLORASTOR) 250 MG capsule Take 1 capsule (250 mg total) by mouth 2 (two) times daily. 30 capsule 1   tamsulosin (FLOMAX) 0.4 MG CAPS capsule Take 0.4 mg by mouth at bedtime.     thiamine (VITAMIN B-1) 100 MG tablet Take 1 tablet (100 mg total) by mouth daily. 30 tablet 0   torsemide (DEMADEX) 20 MG tablet Take 60 mg by mouth daily.     traZODone (DESYREL) 50 MG tablet Take 1 tablet (50 mg total) by mouth at bedtime. 30 tablet 0   doxycycline (VIBRAMYCIN) 100 MG capsule Take 1 capsule (100 mg total) by mouth 2 (two) times daily. One po bid x 7 days (Patient not taking: Reported on 02/22/2022) 14 capsule 0   Labs: Basic Metabolic Panel: Recent Labs  Lab 02/20/22 2308 02/21/22 2009 02/22/22 0230 02/22/22 0243  NA 129* 137 135 135  K 3.5 3.2* 3.0* 2.9*  CL 94* 98 100  --   CO2 20* 25 25  --   GLUCOSE 123* 92 96  --   BUN 50* 20 24*  --   CREATININE 3.69* 2.05* 2.32*  --   CALCIUM 8.2* 8.2* 7.9*  --   PHOS  --   --  2.4*  --    Liver Function Tests: Recent Labs  Lab 02/20/22 2308 02/21/22 2009 02/22/22 0230  AST 28 40 37  ALT _0 ALKPHOS 103 76 62  BILITOT 1.4* 1.6* 1.6*  PROT 5.5* 5.2* 4.8*  ALBUMIN 3.0* 2.6* 2.4*   Recent Labs  Lab 02/22/22 0230  AMMONIA 13   CBC: Recent Labs  Lab 02/20/22 2308 02/21/22 2009 02/22/22 0243  WBC 5.7 7.9  --   NEUTROABS 4.8 6.8  --   HGB 9.3* 8.4* 7.1*  HCT 26.4* 23.9* 21.0*  MCV 101.5* 100.8*  --   PLT 93* 83*  --    Cardiac Enzymes: Recent Labs  Lab 02/22/22 0230  CKTOTAL 256   Iron Studies:  Recent Labs    02/22/22 0230  IRON 15*  TIBC 150*  FERRITIN 1,703*   Studies/Results: DG Foot 2 Views Left  Result Date: 02/22/2022 CLINICAL DATA:  Cellulitis EXAM: LEFT FOOT - 2 VIEW COMPARISON:  Radiographs 11/02/2021; MRI 06/08/2021; radiographs 06/07/2021 FINDINGS: 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 Osseous destruction at the base of the second proximal phalanx in the  head of the second metatarsal consistent with osteomyelitis. Question osteo myelitis of the medial aspect of the fourth metatarsal head versus projection artifact. Remote posttraumatic deformity about the head of the third metatarsal. 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 Postsurgical changes about the ankle. 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 Question osteomyelitis of the medial aspect of the fourth metatarsal head. Electronically Signed   By: Placido Sou M.D.   On: 02/22/2022 02:04   DG Chest 1 View  Result Date: 02/21/2022 CLINICAL DATA:  Weakness EXAM: CHEST  1 VIEW COMPARISON:  02/20/2022 FINDINGS: Stable cardiomediastinal contours. Slightly low lung volumes with bibasilar atelectasis. Trace effusions. No pneumothorax. Advanced degenerative changes of the left glenohumeral joint. IMPRESSION: Bibasilar atelectasis and trace bilateral pleural effusions. Electronically Signed   By: Davina Poke D.O.   On: 02/21/2022 20:42   CT CHEST ABDOMEN PELVIS WO CONTRAST  Result Date: 02/21/2022 CLINICAL DATA:  81 year old male with history of unintended weight loss and fever. EXAM: CT CHEST, ABDOMEN AND PELVIS WITHOUT CONTRAST TECHNIQUE: Multidetector CT imaging of the chest, abdomen and pelvis was performed following the standard protocol without IV contrast. RADIATION DOSE REDUCTION: This exam was performed according to the departmental dose-optimization program which includes automated exposure control, adjustment of the mA and/or kV according to patient size and/or use of iterative reconstruction technique. COMPARISON:  CT of the abdomen and pelvis 11/19/2021. No prior chest CT. FINDINGS: CT CHEST FINDINGS Cardiovascular: Heart size is normal. There is no significant pericardial fluid, thickening or pericardial calcification. There is aortic atherosclerosis, as well as atherosclerosis of the great vessels of the mediastinum and the coronary arteries, including  calcified atherosclerotic plaque in the left main, left anterior descending, left circumflex and right coronary arteries. Mild calcifications of the mitral annulus. Moderate calcifications of the aortic valve. Mediastinum/Nodes: No pathologically enlarged mediastinal or hilar lymph nodes. Please note that accurate exclusion of hilar adenopathy is limited on noncontrast CT scans. Esophagus is unremarkable in appearance. No axillary lymphadenopathy. Lungs/Pleura: Trace bilateral pleural effusions lying dependently with minimal passive subsegmental atelectasis in the lower lobes of the lungs bilaterally. There is some very mild patchy areas of ground-glass attenuation in the right upper lobe, nonspecific, but potentially of infectious or inflammatory etiology. No confluent consolidative airspace disease. No definite suspicious appearing pulmonary nodules or masses are  noted. Musculoskeletal: Multiple old healed right-sided rib fractures are incidentally noted. There are no aggressive appearing lytic or blastic lesions noted in the visualized portions of the skeleton. CT ABDOMEN PELVIS FINDINGS Hepatobiliary: Liver has a shrunken appearance and nodular contour, indicative of advanced cirrhosis. No discrete cystic or solid hepatic lesions are confidently identified on today's noncontrast CT examination. Multiple tiny calcified gallstones lie dependently in the gallbladder. Gallbladder is moderately distended. Gallbladder wall does not appear thickened or edematous. No definite pericholecystic fluid. Pancreas: No definite pancreatic mass or peripancreatic fluid collections or inflammatory changes are noted on today's noncontrast CT examination. Spleen: Spleen appears mildly enlarged measuring 11.5 x 7.7 x 12.1 cm (estimated splenic volume of 536 mL) . Adrenals/Urinary Tract: Mild bilateral renal atrophy. In the lower pole of the left kidney there is a partially exophytic low-attenuation 1.7 cm lesion (axial image 78 of  series 3) which is incompletely characterized on today's noncontrast CT examination, but similar to prior studies and statistically likely a small cysts (no imaging follow-up is recommended). Unenhanced appearance of the right kidney and bilateral adrenal glands is otherwise unremarkable. No hydroureteronephrosis. Urinary bladder is largely obscured by beam hardening artifact from the patient's bilateral hip arthroplasties. Stomach/Bowel: Unenhanced appearance of the stomach is normal. There is no pathologic dilatation of small bowel or colon. A few scattered colonic diverticuli are noted, without definite focal surrounding inflammatory changes to indicate an acute diverticulitis at this time. Normal appendix. Vascular/Lymphatic: Atherosclerotic calcifications throughout the abdominal aorta and pelvic vasculature. Portal vein appears mildly dilated measuring 17 mm in the porta hepatis. No lymphadenopathy noted in the abdomen or pelvis. Reproductive: Prostate gland and seminal vesicles are obscured by beam hardening artifact. Other: No significant volume of ascites.  No pneumoperitoneum. Musculoskeletal: There are no aggressive appearing lytic or blastic lesions noted in the visualized portions of the skeleton. 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 IMPRESSION: 1. No definite findings in the chest, abdomen or pelvis to account for the patient's unexplained weight loss. There are very subtle areas of mild ground-glass attenuation in the right upper lobe which are nonspecific, but potentially of infectious or inflammatory etiology. 2. Trace bilateral pleural effusions lying dependently. 3. Cholelithiasis without evidence of acute cholecystitis. 4. 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 5. Colonic diverticulosis without evidence of acute diverticulitis at this time. 6. Aortic atherosclerosis, in  addition to left main and three-vessel coronary artery disease. 7. There are calcifications of the aortic valve and mitral annulus. Echocardiographic correlation for evaluation of potential valvular dysfunction may be warranted if clinically indicated. 8. Additional incidental findings, as above. Electronically Signed   By: Vinnie Langton M.D.   On: 02/21/2022 06:26   DG Chest 2 View  Result Date: 02/20/2022 CLINICAL DATA:  Fall. EXAM: CHEST - 2 VIEW COMPARISON:  Chest x-ray 01/25/2022 FINDINGS: The heart size and mediastinal contours are within normal limits. There is minimal atelectasis in the lung bases. Lung volumes are low. Visualized skeletal structures are unremarkable. IMPRESSION: No active cardiopulmonary disease. Electronically Signed   By: Ronney Asters M.D.   On: 02/20/2022 23:07   DG Pelvis Portable  Result Date: 02/20/2022 CLINICAL DATA:  Fall. EXAM: PORTABLE PELVIS 1-2 VIEWS COMPARISON:  06/05/2021. FINDINGS: There is no evidence of pelvic fracture or diastasis. Bilateral hip arthroplasty changes are noted. There is no evidence of hardware loosening. Degenerative changes are noted in the lower lumbar spine. IMPRESSION: No acute fracture  or dislocation. Electronically Signed   By: Brett Fairy M.D.   On: 02/20/2022 23:05   CT Head Wo Contrast  Result Date: 02/20/2022 CLINICAL DATA:  Fall EXAM: CT HEAD WITHOUT CONTRAST CT CERVICAL SPINE WITHOUT CONTRAST TECHNIQUE: Multidetector CT imaging of the head and cervical spine was performed following the standard protocol without intravenous contrast. Multiplanar CT image reconstructions of the cervical spine were also generated. RADIATION DOSE REDUCTION: This exam was performed according to the departmental dose-optimization program which includes automated exposure control, adjustment of the mA and/or kV according to patient size and/or use of iterative reconstruction technique. COMPARISON:  01/31/2022 FINDINGS: Motion degraded images. CT  HEAD FINDINGS Brain: No evidence of acute infarction, hemorrhage, hydrocephalus, extra-axial collection or mass lesion/mass effect. Global cortical and central atrophy. Subcortical white matter and periventricular small vessel ischemic changes. Vascular: Intracranial atherosclerosis. Skull: Normal. Negative for fracture or focal lesion. Sinuses/Orbits: The visualized paranasal sinuses are essentially clear. The mastoid air cells are unopacified. Other: None. CT CERVICAL SPINE FINDINGS Alignment: Straightening the cervical spine, likely positional. Skull base and vertebrae: Stable fracture involving the posterior aspect of the right superior articular facet of C1 (series 3/image 13). No new/acute fracture. Soft tissues and spinal canal: No prevertebral fluid or swelling. No visible canal hematoma. Disc levels: Moderate degenerative changes of the lower cervical spine. Spinal canal is patent. Upper chest: Visualized lung apices are essentially clear. Other: Visualized thyroid is grossly unremarkable. IMPRESSION: No acute intracranial abnormality. Atrophy with small vessel ischemic changes. Stable fracture involving the posterior aspect of the right superior articular facet of C1. No new/acute traumatic injury to the cervical spine. Electronically Signed   By: Julian Hy M.D.   On: 02/20/2022 22:48   CT Cervical Spine Wo Contrast  Result Date: 02/20/2022 CLINICAL DATA:  Fall EXAM: CT HEAD WITHOUT CONTRAST CT CERVICAL SPINE WITHOUT CONTRAST TECHNIQUE: Multidetector CT imaging of the head and cervical spine was performed following the standard protocol without intravenous contrast. Multiplanar CT image reconstructions of the cervical spine were also generated. RADIATION DOSE REDUCTION: This exam was performed according to the departmental dose-optimization program which includes automated exposure control, adjustment of the mA and/or kV according to patient size and/or use of iterative reconstruction  technique. COMPARISON:  01/31/2022 FINDINGS: Motion degraded images. CT HEAD FINDINGS Brain: No evidence of acute infarction, hemorrhage, hydrocephalus, extra-axial collection or mass lesion/mass effect. Global cortical and central atrophy. Subcortical white matter and periventricular small vessel ischemic changes. Vascular: Intracranial atherosclerosis. Skull: Normal. Negative for fracture or focal lesion. Sinuses/Orbits: The visualized paranasal sinuses are essentially clear. The mastoid air cells are unopacified. Other: None. CT CERVICAL SPINE FINDINGS Alignment: Straightening the cervical spine, likely positional. Skull base and vertebrae: Stable fracture involving the posterior aspect of the right superior articular facet of C1 (series 3/image 13). No new/acute fracture. Soft tissues and spinal canal: No prevertebral fluid or swelling. No visible canal hematoma. Disc levels: Moderate degenerative changes of the lower cervical spine. Spinal canal is patent. Upper chest: Visualized lung apices are essentially clear. Other: Visualized thyroid is grossly unremarkable. IMPRESSION: No acute intracranial abnormality. Atrophy with small vessel ischemic changes. Stable fracture involving the posterior aspect of the right superior articular facet of C1. No new/acute traumatic injury to the cervical spine. Electronically Signed   By: Julian Hy M.D.   On: 02/20/2022 22:48    ROS: All others negative except those listed in HPI.   Physical Exam: Vitals:   02/22/22 6644 02/22/22 0756 02/22/22 0347 02/22/22 0940  BP: (!) 110/43 127/62 132/65 (!) 130/59  Pulse: 87 87 91 86  Resp: 18 18 (!) 24 (!) 21  Temp: 98.2 F (36.8 C) 97.7 F (36.5 C) 98.1 F (36.7 C)   TempSrc: Oral Oral    SpO2: 93% 94%  98%  Weight:      Height:         General: Lethargic, chronically ill appearing male in NAD Head: Fries, small ecchymosis on L lateral forehead area, sclera not icteric MMM Neck: C collar in place Lungs: CTA  bilaterally. No wheeze, rales or rhonchi. Breathing is unlabored. Heart: RRR. No murmur, rubs or gallops.  Abdomen: Soft, nontender, no guarding, no rebound tenderness Lower extremities: No edema, ischemic changes, or open wounds  Neuro: AAOx2. Disoriented to year. Psych:  Slow to respond to questions. Normal affect. Dialysis Access: LUE AVF in use  Dialysis Orders:  TTS - [facility] 4hrs, BFR 400/AF 1.5,  EDW 79.8 kg, 3.0 K/ 2.5 Ca  Access: LU AVF  No Heparin  Mircera 150 mcg q2wks - last 9/27   Assessment/Plan: Sepsis possibly d/t PNA - Management per primary team -SIRS criteria met with tachycardia, fever, RR >20 -Received ~2L lactated ringers in ED.  -CT with likely PNA.  Started on broad spectrum ABX.  Elevated troponin  - Management per primary team. Cardiology consulted.  Acute hyponatremia - resolved.   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  Received 2L in ED.  Left ~3L over dry weight last HD. Plan for net UF 1-2L as tolerated.  Possible weight gain, may need to adjust dry.    Anemia of CKD  - Hgb 7.1 - ESA due 10/19 - increase dose to 26mg qwk - tsat 10%, ferritin 1700, no IV iron.  - Transfuse if Hbg <7.0   Secondary Hyperparathyroidism  - Ca 7.9, CCa 9.2, Phos 2.4 - Not on VDRA or binders.    Nutrition - On dysphagia diet. Will add fluid restriction.  Paroxysmal afib - not on AC Hyperlipidema GERD Debility Alcoholic Cirrhosis of liver without ascites CAD  Progress note written with assistance by PA student.  PA performed own history and physical exam.  Plan discussed with student and documented.  ACathleen Fears WU[facility] WestFSal[facility] HospitalPA-S2  LJen Mow PA-C [facility] 02/22/2022, 10:27 AM    Patient seen and  examined, agree with above note with above modifications. Pt well known to me- was talking up a storm in HD this AM-  claiming that he is able to perform his ADLs very well-  was not sure why he had missed dialysis as an OP -  will perform routine HD while here - next due on Thursday  KCorliss Parish MD 02/22/2022

## 2022-02-22 NOTE — Assessment & Plan Note (Signed)
Pt/ ot eval prior to dc 

## 2022-02-22 NOTE — H&P (Signed)
Darryl Diaz SWH:[id number] DOB: Nov 12, 1940 DOA: 02/21/2022     PCP: [name], MD      Patient arrived to ER on 02/21/22 at [place] by Attending Truddie Hidden, MD   Patient coming from:     From facility SNF  [facility] [place]   Chief Complaint:   Chief Complaint  Patient presents with   Weakness    HPI: Darryl Diaz is a 81 y.o. male with medical history significant of    HTN, HLD, GERD, OA, a. Fib  sleep apnea, chf, cirrhosis, GI bleed, and ESRD on HD MWF, falls,  C1 fx   Presented with   decreased PO intake  Patient had a full 15 fall October he had pain all over the sternum he already has known C1 fracture sustained on 15 September wear c-collar. She has history of A-fib but not on anticoagulation secondary to falls and GI bleed. He also has history of end-stage renal disease on hemodialysis Monday Wednesday Friday Initially when patient came in his magnesium was low was repleted she was ready for discharge he had a fever Blood cultures were ordered CT chest was done showing Opacities concerning for pneumonia he was started on doxycycline Initially he was able to be discharged back to facility but returned endorsing generalized fatigue poor appetite dysuria he was also endorsing some diarrhea as well poor p.o. intake And does have some dysuria for past few days Patient has missed his hemodialysis for the past few days as well He has blood cultures drawn the day before showed positive Staphylococcus He was given IV vancomycin     Initial COVID TEST  NEGATIVE*   Lab Results  Component Value Date   [place] 02/21/2022   [place] NEGATIVE 12/20/2021   [place] (A) 06/03/2021   [place] NEGATIVE 05/01/2021     Regarding pertinent Chronic problems:    Hyperlipidemia -  on statins Lipitor (atorvastatin)  Lipid Panel     Component Value Date/Time   CHOL 109 09/07/2020 2142   TRIG 40 09/07/2020 2142    HDL 55 09/07/2020 2142   CHOLHDL 2.0 09/07/2020 2142   VLDL 8 09/07/2020 2142   [place] 46 09/07/2020 2142     HTN on Coreg  GERD on Protonix        OSA -on nocturnal oxygen, *CPAP, *noncompliant with CPAP     A. Fib -  - CHA2DS2 vas score  6      Not on anticoagulation secondary to Risk of Falls,          -  Rate control:   Coreg          - Rhythm control:   amiodarone,     End-stage renal disease on hemodialysis Monday Wednesday Friday missed a whole week but still making urine Lab Results  Component Value Date   CREATININE 2.05 (H) 02/21/2022   CREATININE 3.69 (H) 02/20/2022   CREATININE 4.12 (H) 01/26/2022    Liver disease MELD 3.0: 20 at 02/21/2022  8:09 PM       BPH - on Flomax,       Chronic anemia - baseline hg Hemoglobin & Hematocrit  Recent Labs    01/26/22 0748 02/20/22 2308 02/21/22 2009  HGB 8.2* 9.3* 8.4*     While in ER:        CXR - Bibasilar atelectasis and trace bilateral pleural effusions.  CTabd/pelvis - Cirrhosis  CTA chest -mild ground-glass attenuation in the  right upper lobe which are nonspecific, but potentially of infectious or inflammatory etiology.  Following Medications were ordered in ER: Medications  lactated ringers infusion ( Intravenous New Bag/Given 02/22/22 0000)  vancomycin (VANCOREADY) IVPB 1500 mg/300 mL (has no administration in time range)  vancomycin (VANCOREADY) IVPB 1500 mg/300 mL (has no administration in time range)  ceFEPIme (MAXIPIME) 2 g in sodium chloride 0.9 % 100 mL IVPB (has no administration in time range)  lactated ringers bolus 500 mL (0 mLs Intravenous Stopped 02/21/22 2213)  lactated ringers bolus 500 mL (0 mLs Intravenous Stopped 02/21/22 2302)  lactated ringers bolus 500 mL (0 mLs Intravenous Stopped 02/22/22 0000)  ceFEPIme (MAXIPIME) 2 g in sodium chloride 0.9 % 100 mL IVPB (0 g Intravenous Stopped 02/22/22 0000)    _______________________________________________________ ER Provider  Called:  Nephrology   Dr. Augustin Coupe  They Recommend admit to medicine   Will see in AM   ED Triage Vitals  Enc Vitals Group     BP 02/21/22 1957 (!) 131/57     Pulse Rate 02/21/22 1957 91     Resp 02/21/22 1957 16     Temp 02/21/22 1958 98.1 F (36.7 C)     Temp Source 02/21/22 1958 Oral     SpO2 02/21/22 1957 98 %     Weight 02/21/22 1959 171 lb (77.6 kg)     Height 02/21/22 1959 6' (1.829 m)     Head Circumference --      Peak Flow --      Pain Score 02/21/22 1959 0     Pain Loc --      Pain Edu? --      Excl. in [place]? --   TMAX(24)@     _________________________________________ Significant initial  Findings: Abnormal Labs Reviewed  LACTIC ACID, PLASMA - Abnormal; Notable for the following components:      Result Value   Lactic Acid, Venous 2.8 (*)    All other components within normal limits  LACTIC ACID, PLASMA - Abnormal; Notable for the following components:   Lactic Acid, Venous 2.0 (*)    All other components within normal limits  COMPREHENSIVE METABOLIC PANEL - Abnormal; Notable for the following components:   Potassium 3.2 (*)    Creatinine, Ser 2.05 (*)    Calcium 8.2 (*)    Total Protein 5.2 (*)    Albumin 2.6 (*)    Total Bilirubin 1.6 (*)    GFR, Estimated 32 (*)    All other components within normal limits  CBC WITH DIFFERENTIAL/PLATELET - Abnormal; Notable for the following components:   RBC 2.37 (*)    Hemoglobin 8.4 (*)    HCT 23.9 (*)    MCV 100.8 (*)    MCH 35.4 (*)    Platelets 83 (*)    Lymphs Abs 0.2 (*)    Abs Immature Granulocytes 0.28 (*)    All other components within normal limits  PROTIME-INR - Abnormal; Notable for the following components:   Prothrombin Time 17.3 (*)    INR 1.4 (*)    All other components within normal limits  TROPONIN I (HIGH SENSITIVITY) - Abnormal; Notable for the following components:   Troponin I (High Sensitivity) 375 (*)    All other components within normal limits  TROPONIN I (HIGH SENSITIVITY) - Abnormal;  Notable for the following components:   Troponin I (High Sensitivity) 346 (*)    All other components within normal limits     _________________________ Troponin 375- 346 ECG:  Ordered Personally reviewed and interpreted by me showing: HR : 91 Rhythm: Normal sinus rhythm Inferior infarct , age undetermined Anteroseptal infarct , age undetermined Prolonged QT Abnormal ECG QTC 531   ____________________ This patient meets SIRS Criteria and may be septic.     The recent clinical data is shown below. Vitals:   02/21/22 1957 02/21/22 1958 02/21/22 1959 02/21/22 2301  BP: (!) 131/57   (!) 129/59  Pulse: 91   90  Resp: 16   (!) 22  Temp:  98.1 F (36.7 C)  98.6 F (37 C)  TempSrc:  Oral    SpO2: 98%   95%  Weight:   77.6 kg   Height:   6' (1.829 m)     WBC     Component Value Date/Time   WBC 7.9 02/21/2022 2009   LYMPHSABS 0.2 (L) 02/21/2022 2009   MONOABS 0.6 02/21/2022 2009   EOSABS 0.0 02/21/2022 2009   BASOSABS 0.0 02/21/2022 2009    Lactic Acid, Venous    Component Value Date/Time   LATICACIDVEN 2.0 (HH) 02/21/2022 2300     Procalcitonin   Ordered     UA  ordered     Results for orders placed or performed during the hospital encounter of 02/20/22  SARS Coronavirus 2 by RT PCR (hospital order, performed in [facility] hospital lab) *cepheid single result test* Anterior Nasal Swab     Status: None   Collection Time: 02/21/22  3:16 AM   Specimen: Anterior Nasal Swab  Result Value Ref Range Status   SARS Coronavirus 2 by RT PCR NEGATIVE NEGATIVE Final        Blood culture (routine x 2)     Status: None (Preliminary result)   Collection Time: 02/21/22  4:07 AM   Specimen: BLOOD RIGHT HAND  Result Value Ref Range Status   Specimen Description BLOOD RIGHT HAND  Final   Special Requests AEROBIC BOTTLE ONLY Blood Culture adequate volume  Final   Culture  Setup Time   Final    GRAM POSITIVE COCCI IN CLUSTERS AEROBIC BOTTLE ONLY CRITICAL RESULT CALLED TO,  READ BACK BY AND VERIFIED WITH: [place] ON 02/21/22 @ 2022 BY DRT Performed at [facility], [place]. [address] [place], [place] [address]    Culture GRAM POSITIVE COCCI IN CLUSTERS  Final   Report Status PENDING  Incomplete  Blood Culture ID Panel (Reflexed)     Status: Abnormal   Collection Time: 02/21/22  4:07 AM  Result Value Ref Range Status   Enterococcus faecalis NOT DETECTED NOT DETECTED Final   Enterococcus Faecium NOT DETECTED NOT DETECTED Final   Listeria monocytogenes NOT DETECTED NOT DETECTED Final   Staphylococcus species DETECTED (A) NOT DETECTED Final    Comment: CRITICAL RESULT CALLED TO, READ BACK BY AND VERIFIED WITH: PHARM CATHY PEARCE ON 02/21/22 @ 2022 BY DRT    Staphylococcus aureus (BCID) DETECTED (A) NOT DETECTED Final    Comment: CRITICAL RESULT CALLED TO, READ BACK BY AND VERIFIED WITH: PHARM CATHY PEARCE ON 02/21/22 @ 2022 BY DRT    Staphylococcus epidermidis NOT DETECTED NOT DETECTED Final   Staphylococcus lugdunensis NOT DETECTED NOT DETECTED Final   Streptococcus species NOT DETECTED NOT DETECTED Final   Streptococcus agalactiae NOT DETECTED NOT DETECTED Final   Streptococcus pneumoniae NOT DETECTED NOT DETECTED Final   Streptococcus pyogenes NOT DETECTED NOT DETECTED Final   A.calcoaceticus-baumannii NOT DETECTED NOT DETECTED Final   Bacteroides fragilis NOT DETECTED NOT DETECTED Final  Enterobacterales NOT DETECTED NOT DETECTED Final   Enterobacter cloacae complex NOT DETECTED NOT DETECTED Final   Escherichia coli NOT DETECTED NOT DETECTED Final   Klebsiella aerogenes NOT DETECTED NOT DETECTED Final   Klebsiella oxytoca NOT DETECTED NOT DETECTED Final   Klebsiella pneumoniae NOT DETECTED NOT DETECTED Final   Proteus species NOT DETECTED NOT DETECTED Final   Salmonella species NOT DETECTED NOT DETECTED Final   Serratia marcescens NOT DETECTED NOT DETECTED Final   Haemophilus influenzae NOT DETECTED NOT DETECTED Final   Neisseria  meningitidis NOT DETECTED NOT DETECTED Final   Pseudomonas aeruginosa NOT DETECTED NOT DETECTED Final   Stenotrophomonas maltophilia NOT DETECTED NOT DETECTED Final   Candida albicans NOT DETECTED NOT DETECTED Final   Candida auris NOT DETECTED NOT DETECTED Final   Candida glabrata NOT DETECTED NOT DETECTED Final   Candida krusei NOT DETECTED NOT DETECTED Final   Candida parapsilosis NOT DETECTED NOT DETECTED Final   Candida tropicalis NOT DETECTED NOT DETECTED Final   Cryptococcus neoformans/gattii NOT DETECTED NOT DETECTED Final   Meth resistant mecA/C and MREJ NOT DETECTED NOT DETECTED Final    Comment: Performed at [facility], [place] [address], [place], [place] [address]  Blood culture (routine x 2)     Status: None (Preliminary result)   Collection Time: 02/21/22  6:19 AM   Specimen: BLOOD LEFT HAND  Result Value Ref Range Status   Specimen Description BLOOD LEFT HAND  Final   Special Requests AEROBIC BOTTLE ONLY Blood Culture adequate volume  Final   Culture  Setup Time   Final    GRAM POSITIVE COCCI IN CLUSTERS AEROBIC BOTTLE ONLY CRITICAL VALUE NOTED.  VALUE IS CONSISTENT WITH PREVIOUSLY REPORTED AND CALLED VALUE. Performed at [facility], [place] [address], [place], [place] [address]    Culture PENDING  Incomplete   Report Status PENDING  Incomplete    _______________________________________________ Hospitalist was called for admission for Sepsis due to CAP vs cellulitis   The following Work up has been ordered so far:  Orders Placed This Encounter  Procedures   Blood Culture (routine x 2)   Urine Culture   C Difficile Quick Screen w PCR reflex   Resp Panel by RT-PCR (Flu A&B, Covid) Anterior Nasal Swab   MRSA Next Gen by PCR, Nasal   DG Chest 1 View   Lactic acid, plasma   Comprehensive metabolic panel   CBC with Differential   Protime-INR   Urinalysis, Routine w reflex microscopic   Magnesium   Cardiac monitoring   ceFEPime (MAXIPIME) per pharmacy  consult            vancomycin per pharmacy consult   Consult to hospitalist   Enteric precautions (UV disinfection)   Pulse oximetry, continuous   ED EKG 12-Lead     OTHER Significant initial  Findings:  labs showing:  Recent Labs  Lab 02/20/22 2308 02/21/22 2009  NA 129* 137  K 3.5 3.2*  CO2 20* 25  GLUCOSE 123* 92  BUN 50* 20  CREATININE 3.69* 2.05*  CALCIUM 8.2* 8.2*  MG 1.4* 1.7    Cr    stable,    Lab Results  Component Value Date   CREATININE 2.05 (H) 02/21/2022   CREATININE 3.69 (H) 02/20/2022   CREATININE 4.12 (H) 01/26/2022    Recent Labs  Lab 02/20/22 2308 02/21/22 2009  AST 28 40  ALT 15 17  ALKPHOS 103 76  BILITOT 1.4* 1.6*  PROT 5.5* 5.2*  ALBUMIN 3.0* 2.6*   Lab Results  Component Value Date   CALCIUM 8.2 (L) 02/21/2022   PHOS 2.0 (L) 01/24/2022    Plt: Lab Results  Component Value Date   PLT 83 (L) 02/21/2022         Recent Labs  Lab 02/20/22 2308 02/21/22 2009  WBC 5.7 7.9  NEUTROABS 4.8 6.8  HGB 9.3* 8.4*  HCT 26.4* 23.9*  MCV 101.5* 100.8*  PLT 93* 83*    HG/HCT  stable,      Component Value Date/Time   HGB 8.4 (L) 02/21/2022 2009   HGB 10.2 (L) 06/03/2020 1217   HCT 23.9 (L) 02/21/2022 2009   HCT 31.0 (L) 06/03/2020 1217   MCV 100.8 (H) 02/21/2022 2009   MCV 93 06/03/2020 1217    BNP (last 3 results) Recent Labs    09/28/21 0348  BNP 647.5*         Cultures:    Component Value Date/Time   SDES BLOOD LEFT HAND 02/21/2022 4920   SPECREQUEST AEROBIC BOTTLE ONLY Blood Culture adequate volume 02/21/2022 0619   CULT PENDING 02/21/2022 1007   REPTSTATUS PENDING 02/21/2022 1219     Radiological Exams on Admission: DG Chest 1 View  Result Date: 02/21/2022 CLINICAL DATA:  Weakness EXAM: CHEST  1 VIEW COMPARISON:  02/20/2022 FINDINGS: Stable cardiomediastinal contours. Slightly low lung volumes with bibasilar atelectasis. Trace effusions. No pneumothorax. Advanced degenerative changes of the left glenohumeral  joint. IMPRESSION: Bibasilar atelectasis and trace bilateral pleural effusions. Electronically Signed   By: Davina Poke D.O.   On: 02/21/2022 20:42   CT CHEST ABDOMEN PELVIS WO CONTRAST  Result Date: 02/21/2022 CLINICAL DATA:  81 year old male with history of unintended weight loss and fever. EXAM: CT CHEST, ABDOMEN AND PELVIS WITHOUT CONTRAST TECHNIQUE: Multidetector CT imaging of the chest, abdomen and pelvis was performed following the standard protocol without IV contrast. RADIATION DOSE REDUCTION: This exam was performed according to the departmental dose-optimization program which includes automated exposure control, adjustment of the mA and/or kV according to patient size and/or use of iterative reconstruction technique. COMPARISON:  CT of the abdomen and pelvis 11/19/2021. No prior chest CT. FINDINGS: CT CHEST FINDINGS Cardiovascular: Heart size is normal. There is no significant pericardial fluid, thickening or pericardial calcification. There is aortic atherosclerosis, as well as atherosclerosis of the great vessels of the mediastinum and the coronary arteries, including calcified atherosclerotic plaque in the left main, left anterior descending, left circumflex and right coronary arteries. Mild calcifications of the mitral annulus. Moderate calcifications of the aortic valve. Mediastinum/Nodes: No pathologically enlarged mediastinal or hilar lymph nodes. Please note that accurate exclusion of hilar adenopathy is limited on noncontrast CT scans. Esophagus is unremarkable in appearance. No axillary lymphadenopathy. Lungs/Pleura: Trace bilateral pleural effusions lying dependently with minimal passive subsegmental atelectasis in the lower lobes of the lungs bilaterally. There is some very mild patchy areas of ground-glass attenuation in the right upper lobe, nonspecific, but potentially of infectious or inflammatory etiology. No confluent consolidative airspace disease. No definite suspicious  appearing pulmonary nodules or masses are noted. Musculoskeletal: Multiple old healed right-sided rib fractures are incidentally noted. There are no aggressive appearing lytic or blastic lesions noted in the visualized portions of the skeleton. CT ABDOMEN PELVIS FINDINGS Hepatobiliary: Liver has a shrunken appearance and nodular contour, indicative of advanced cirrhosis. No discrete cystic or solid hepatic lesions are confidently identified on today's noncontrast CT examination. Multiple tiny calcified gallstones lie dependently in the gallbladder. Gallbladder is moderately distended.  Gallbladder wall does not appear thickened or edematous. No definite pericholecystic fluid. Pancreas: No definite pancreatic mass or peripancreatic fluid collections or inflammatory changes are noted on today's noncontrast CT examination. Spleen: Spleen appears mildly enlarged measuring 11.5 x 7.7 x 12.1 cm (estimated splenic volume of 536 mL) . Adrenals/Urinary Tract: Mild bilateral renal atrophy. In the lower pole of the left kidney there is a partially exophytic low-attenuation 1.7 cm lesion (axial image 78 of series 3) which is incompletely characterized on today's noncontrast CT examination, but similar to prior studies and statistically likely a small cysts (no imaging follow-up is recommended). Unenhanced appearance of the right kidney and bilateral adrenal glands is otherwise unremarkable. No hydroureteronephrosis. Urinary bladder is largely obscured by beam hardening artifact from the patient's bilateral hip arthroplasties. Stomach/Bowel: Unenhanced appearance of the stomach is normal. There is no pathologic dilatation of small bowel or colon. A few scattered colonic diverticuli are noted, without definite focal surrounding inflammatory changes to indicate an acute diverticulitis at this time. Normal appendix. Vascular/Lymphatic: Atherosclerotic calcifications throughout the abdominal aorta and pelvic vasculature. Portal vein  appears mildly dilated measuring 17 mm in the porta hepatis. No lymphadenopathy noted in the abdomen or pelvis. Reproductive: Prostate gland and seminal vesicles are obscured by beam hardening artifact. Other: No significant volume of ascites.  No pneumoperitoneum. Musculoskeletal: There are no aggressive appearing lytic or blastic lesions noted in the visualized portions of the skeleton. 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 IMPRESSION: 1. No definite findings in the chest, abdomen or pelvis to account for the patient's unexplained weight loss. There are very subtle areas of mild ground-glass attenuation in the right upper lobe which are nonspecific, but potentially of infectious or inflammatory etiology. 2. Trace bilateral pleural effusions lying dependently. 3. Cholelithiasis without evidence of acute cholecystitis. 4. 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 5. Colonic diverticulosis without evidence of acute diverticulitis at this time. 6. Aortic atherosclerosis, in addition to left main and three-vessel coronary artery disease. 7. There are calcifications of the aortic valve and mitral annulus. Echocardiographic correlation for evaluation of potential valvular dysfunction may be warranted if clinically indicated. 8. Additional incidental findings, as above. Electronically Signed   By: Vinnie Langton M.D.   On: 02/21/2022 06:26   DG Chest 2 View  Result Date: 02/20/2022 CLINICAL DATA:  Fall. EXAM: CHEST - 2 VIEW COMPARISON:  Chest x-ray 01/25/2022 FINDINGS: The heart size and mediastinal contours are within normal limits. There is minimal atelectasis in the lung bases. Lung volumes are low. Visualized skeletal structures are unremarkable. IMPRESSION: No active cardiopulmonary disease. Electronically Signed   By: Ronney Asters M.D.   On: 02/20/2022 23:07   DG Pelvis  Portable  Result Date: 02/20/2022 CLINICAL DATA:  Fall. EXAM: PORTABLE PELVIS 1-2 VIEWS COMPARISON:  06/05/2021. FINDINGS: There is no evidence of pelvic fracture or diastasis. Bilateral hip arthroplasty changes are noted. There is no evidence of hardware loosening. Degenerative changes are noted in the lower lumbar spine. IMPRESSION: No acute fracture or dislocation. Electronically Signed   By: Brett Fairy M.D.   On: 02/20/2022 23:05   CT Head Wo Contrast  Result Date: 02/20/2022 CLINICAL DATA:  Fall EXAM: CT HEAD WITHOUT CONTRAST CT CERVICAL SPINE WITHOUT CONTRAST TECHNIQUE: Multidetector CT imaging of the head and cervical spine was performed following the standard protocol without intravenous contrast. Multiplanar CT image reconstructions of the cervical spine were also generated. RADIATION DOSE REDUCTION:  This exam was performed according to the departmental dose-optimization program which includes automated exposure control, adjustment of the mA and/or kV according to patient size and/or use of iterative reconstruction technique. COMPARISON:  01/31/2022 FINDINGS: Motion degraded images. CT HEAD FINDINGS Brain: No evidence of acute infarction, hemorrhage, hydrocephalus, extra-axial collection or mass lesion/mass effect. Global cortical and central atrophy. Subcortical white matter and periventricular small vessel ischemic changes. Vascular: Intracranial atherosclerosis. Skull: Normal. Negative for fracture or focal lesion. Sinuses/Orbits: The visualized paranasal sinuses are essentially clear. The mastoid air cells are unopacified. Other: None. CT CERVICAL SPINE FINDINGS Alignment: Straightening the cervical spine, likely positional. Skull base and vertebrae: Stable fracture involving the posterior aspect of the right superior articular facet of C1 (series 3/image 13). No new/acute fracture. Soft tissues and spinal canal: No prevertebral fluid or swelling. No visible canal hematoma. Disc levels:  Moderate degenerative changes of the lower cervical spine. Spinal canal is patent. Upper chest: Visualized lung apices are essentially clear. Other: Visualized thyroid is grossly unremarkable. IMPRESSION: No acute intracranial abnormality. Atrophy with small vessel ischemic changes. Stable fracture involving the posterior aspect of the right superior articular facet of C1. No new/acute traumatic injury to the cervical spine. Electronically Signed   By: Julian Hy M.D.   On: 02/20/2022 22:48   CT Cervical Spine Wo Contrast  Result Date: 02/20/2022 CLINICAL DATA:  Fall EXAM: CT HEAD WITHOUT CONTRAST CT CERVICAL SPINE WITHOUT CONTRAST TECHNIQUE: Multidetector CT imaging of the head and cervical spine was performed following the standard protocol without intravenous contrast. Multiplanar CT image reconstructions of the cervical spine were also generated. RADIATION DOSE REDUCTION: This exam was performed according to the departmental dose-optimization program which includes automated exposure control, adjustment of the mA and/or kV according to patient size and/or use of iterative reconstruction technique. COMPARISON:  01/31/2022 FINDINGS: Motion degraded images. CT HEAD FINDINGS Brain: No evidence of acute infarction, hemorrhage, hydrocephalus, extra-axial collection or mass lesion/mass effect. Global cortical and central atrophy. Subcortical white matter and periventricular small vessel ischemic changes. Vascular: Intracranial atherosclerosis. Skull: Normal. Negative for fracture or focal lesion. Sinuses/Orbits: The visualized paranasal sinuses are essentially clear. The mastoid air cells are unopacified. Other: None. CT CERVICAL SPINE FINDINGS Alignment: Straightening the cervical spine, likely positional. Skull base and vertebrae: Stable fracture involving the posterior aspect of the right superior articular facet of C1 (series 3/image 13). No new/acute fracture. Soft tissues and spinal canal: No  prevertebral fluid or swelling. No visible canal hematoma. Disc levels: Moderate degenerative changes of the lower cervical spine. Spinal canal is patent. Upper chest: Visualized lung apices are essentially clear. Other: Visualized thyroid is grossly unremarkable. IMPRESSION: No acute intracranial abnormality. Atrophy with small vessel ischemic changes. Stable fracture involving the posterior aspect of the right superior articular facet of C1. No new/acute traumatic injury to the cervical spine. Electronically Signed   By: Julian Hy M.D.   On: 02/20/2022 22:48   _______________________________________________________________________________________________________ Latest  Blood pressure (!) 129/59, pulse 90, temperature 98.6 F (37 C), resp. rate (!) 22, height 6' (1.829 m), weight 77.6 kg, SpO2 95 %.   Vitals  labs and radiology finding personally reviewed  Review of Systems:    Pertinent positives include:    diarrhea,Fevers, chills, fatigue, Constitutional:  No weight loss, night sweats,  weight loss  HEENT:  No headaches, Difficulty swallowing,Tooth/dental problems,Sore throat,  No sneezing, itching, ear ache, nasal congestion, post nasal drip,  Cardio-vascular:  No chest pain, Orthopnea, PND, anasarca, dizziness, palpitations.no Bilateral lower extremity  swelling  GI:  No heartburn, indigestion, abdominal pain, nausea, vomiting, change in bowel habits, loss of appetite, melena, blood in stool, hematemesis Resp:  no shortness of breath at rest. No dyspnea on exertion, No excess mucus, no productive cough, No non-productive cough, No coughing up of blood.No change in color of mucus.No wheezing. Skin:  no rash or lesions. No jaundice GU:  no dysuria, change in color of urine, no urgency or frequency. No straining to urinate.  No flank pain.  Musculoskeletal:  No joint pain or no joint swelling. No decreased range of motion. No back pain.  Psych:  No change in mood or affect. No  depression or anxiety. No memory loss.  Neuro: no localizing neurological complaints, no tingling, no weakness, no double vision, no gait abnormality, no slurred speech, no confusion  All systems reviewed and apart from [place] all are negative _______________________________________________________________________________________________ Past Medical History:   Past Medical History:  Diagnosis Date   Alcohol abuse 16/02/9603   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[place]) 03/17/2020   Alcoholism ([place])    in remission following wife's death   Anemia    Anxiety    Ascending aorta dilation (HCC)    Atrial fibrillation (HCC)    Cardiac arrest ([place]) 04/20/2019   12 min CPR with epi   Chronic diastolic CHF (congestive heart failure) ([place])    Dysrhythmia    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[place])    Mon Wed Fri   Fatty liver    Fracture of hip ([place]) 03/30/2016   Gastritis    GERD (gastroesophageal reflux disease)    H/O seasonal allergies    Heme positive stool 05/14/2020   History of blood transfusion    History of GI bleed    Hx of adenomatous colonic polyps 08/10/2017   Hyperlipidemia    Hypertension    Insomnia    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   Mallory-Weiss tear    OA (osteoarthritis)    hands   OSA (obstructive sleep apnea)    No longer uses CPAP   Persistent atrial fibrillation with rapid ventricular response ([place]) 02/05/2020   Posterior dislocation of hip, closed ([place]) 04/30/2016   Recurrent syncope    Restless legs syndrome (RLS)    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([place])       Past Surgical History:  Procedure Laterality Date   AMPUTATION Left 05/19/2021   Procedure: LEFT GREAT TOE AMPUTATION;  Surgeon: Newt Minion, MD;  Location: [place];  Service: Orthopedics;  Laterality: Left;   ANKLE SURGERY Left 2014   had rods put in    AV FISTULA PLACEMENT Left 04/21/2021   Procedure: LEFT ARM ARTERIOVENOUS (AV) FISTULA CREATION - Left brachiocephalic  fistula;  Surgeon: Marty Heck, MD;  Location: [place];  Service: Vascular;  Laterality: Left;   BIOPSY  02/28/2019   Procedure: BIOPSY;  Surgeon: Milus Banister, MD;  Location: [place];  Service: Endoscopy;;   CARDIOVERSION  02/06/2020   CARDIOVERSION N/A 02/06/2020   Procedure: CARDIOVERSION;  Surgeon: Werner Lean, MD;  Location: [place] ENDOSCOPY;  Service: Cardiovascular;  Laterality: N/A;   CARDIOVERSION N/A 02/24/2020   Procedure: CARDIOVERSION;  Surgeon: Werner Lean, MD;  Location: [place];  Service: Cardiovascular;  Laterality: N/A;   COLONOSCOPY     [place]   COLONOSCOPY WITH PROPOFOL N/A 01/08/2021   Procedure: COLONOSCOPY WITH PROPOFOL;  Surgeon: Milus Banister, MD;  Location[facility] END[facility];  Service: Endoscopy;  Laterality: N/A;  ESOPHAGOGASTRODUODENOSCOPY (EGD) WITH PROPOFOL N/A 02/28/2019   Procedure: ESOPHAGOGASTRODUODENOSCOPY (EGD) WITH PROPOFOL;  Surgeon: Milus Banister, MD;  Location[facility] END[facility]COPY;  Service: Endoscopy;  Laterality: N/A;   FRACTURE SURGERY     HIP ARTHROPLASTY Left 04/01/2016   Procedure: ARTHROPLASTY BIPOLAR HIP (HEMIARTHROPLASTY);  Surgeon: Paralee Cancel, MD;  Location: [place];  Service: Orthopedics;  Laterality: Left;   HIP CLOSED REDUCTION Left 04/30/2016   Procedure: CLOSED REDUCTION HIP;  Surgeon: Wylene Simmer, MD;  Location: [place];  Service: Orthopedics;  Laterality: Left;   HOT HEMOSTASIS N/A 01/08/2021   Procedure: HOT HEMOSTASIS (ARGON PLASMA COAGULATION/BICAP);  Surgeon: Milus Banister, MD;  Location: [facility] ENDOSCOPY;  Service: Endoscopy;  Laterality: N/A;   INSERTION OF DIALYSIS CATHETER N/A 01/25/2022   Procedure: INSERTION OF TUNNELED DIALYSIS CATHETER;  Surgeon: Angelia Mould, MD;  Location: [place];  Service: Vascular;  Laterality: N/A;   IR RADIOLOGIST EVAL & MGMT  12/31/2020   JOINT REPLACEMENT     TONSILLECTOMY     TOTAL HIP ARTHROPLASTY Right 10/15/2019   Procedure: TOTAL HIP ARTHROPLASTY  ANTERIOR APPROACH;  Surgeon: Paralee Cancel, MD;  Location: [facility];  Service: Orthopedics;  Laterality: Right;  70 mins   VASCULAR SURGERY      Social History:      reports that he has quit smoking. His smoking use included cigarettes. He has a 20.00 pack-year smoking history. He has never used smokeless tobacco. He reports that he does not currently use alcohol after a past usage of about 3.0 standard drinks of alcohol per week. He reports that he does not use drugs.     Family History:   Family History  Problem Relation Age of Onset   Heart disease Mother 11   Hypertension Mother    Arthritis Mother    Heart failure Father 17   Stroke Maternal Aunt    Heart failure Sister    Colon cancer Neg Hx    Esophageal cancer Neg Hx    Stomach cancer Neg Hx    Rectal cancer Neg Hx    ______________________________________________________________________________________________ Allergies: Allergies  Allergen Reactions   Penicillin G Sodium Rash and Other (See Comments)     (tolerates Keflex)   Penicillins Rash     Prior to Admission medications   Medication Sig Start Date End Date Taking? Authorizing Provider  amiodarone (PACERONE) 200 MG tablet TAKE 1 TABLET BY MOUTH ONCE DAILY Patient taking differently: Take 200 mg by mouth every morning. 05/17/21   Richardson Dopp T, PA-C  atorvastatin (LIPITOR) 20 MG tablet Take 1 tablet (20 mg total) by mouth daily. 03/10/20   Angiulli, Lavon Paganini, PA-C  carvedilol (COREG) 6.25 MG tablet Take 1 tablet (6.25 mg total) by mouth 2 (two) times daily. Patient taking differently: Take 12.5 mg by mouth daily. 02/11/21   Sherren Mocha, MD  cetirizine (ZYRTEC) 10 MG tablet Take 10 mg by mouth daily.    [provider]  doxycycline (VIBRAMYCIN) 100 MG capsule Take 1 capsule (100 mg total) by mouth 2 (two) times daily. One po bid x 7 days 02/21/22   Mesner, Corene Cornea, MD  escitalopram (LEXAPRO) 10 MG tablet Take 1 tablet (10 mg total) by mouth  daily. Patient taking differently: Take 10 mg by mouth every morning. 03/10/20   Angiulli, Lavon Paganini, PA-C  ferrous sulfate 324 MG TBEC Take 324 mg by mouth every morning.    [provider]  FOLIC ACID PO Take 1 tablet by mouth every morning.  [provider]  Magnesium Oxide 400 MG CAPS Take 1 capsule (400 mg total) by mouth daily. 10/17/20   Eugenie Filler, MD  Multiple Vitamin (MULTIVITAMIN PO) Take 1 tablet by mouth every other day.    [provider]  nitroGLYCERIN (NITROSTAT) 0.4 MG SL tablet Place 1 tablet (0.4 mg total) under the tongue every 5 (five) minutes x 3 doses as needed for chest pain. 07/28/20   Baldwin Jamaica, PA-C  pantoprazole (PROTONIX) 40 MG tablet Take 1 tablet (40 mg total) by mouth daily. Patient taking differently: Take 40 mg by mouth every morning. 03/10/20   Angiulli, Lavon Paganini, PA-C  saccharomyces boulardii (FLORASTOR) 250 MG capsule Take 1 capsule (250 mg total) by mouth 2 (two) times daily. 01/27/22   Regalado, Belkys A, MD  sodium bicarbonate 650 MG tablet Take 650 mg by mouth daily.    [provider]  tamsulosin (FLOMAX) 0.4 MG CAPS capsule Take 0.4 mg by mouth at bedtime.    [provider]  thiamine (VITAMIN B-1) 100 MG tablet Take 1 tablet (100 mg total) by mouth daily. 01/28/22   Regalado, Belkys A, MD  torsemide (DEMADEX) 20 MG tablet Take 60 mg by mouth daily.    [provider]  traZODone (DESYREL) 50 MG tablet Take 1 tablet (50 mg total) by mouth at bedtime. 11/26/21   Alma Friendly, MD    ___________________________________________________________________________________________________ Physical Exam:    02/21/2022   11:01 PM 02/21/2022    7:59 PM 02/21/2022    7:57 PM  Vitals with BMI  Height  6' 0"    Weight  171 lbs   BMI  74.94   Systolic 496  759  Diastolic 59  57  Pulse 90  91     1. General:  in No  Acute distress   Chronically ill  -appearing 2. Psychological: Alert  and   Oriented 3. Head/ENT:    Dry Mucous Membranes                          Head Non traumatic, neck supple                          Poor Dentition 4. SKIN decreased Skin turgor,  Skin clean Dry redness of left lower extremity very increased edema of the left foot left second toe appears to be injured 5. Heart: Regular rate and rhythm no  Murmur, no Rub or gallop 6. Lungs:   no wheezes or crackles   7. Abdomen: Soft,  non-tender, Non distended  bowel sounds present 8. Lower extremities: no clubbing, cyanosis, left worse than right edema 9. Neurologically Grossly intact, moving all 4 extremities equally   10. MSK: Normal range of motion    Chart has been reviewed  ______________________________________________________________________________________________  Assessment/Plan  81 y.o. male with medical history significant of    HTN, HLD, GERD, OA, a. Fib  sleep apnea, chf, cirrhosis, GI bleed, and ESRD on HD MWF, falls,  C1 fx    Admitted for sepsis   Present on Admission:  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[place])  Acute hyponatremia  Diarrhea  Paroxysmal atrial fibrillation (HCC)  Hypertension  Hyperlipidemia  GERD (gastroesophageal reflux disease)  Debility  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HCC)  CAD (coronary artery disease)  Elevated troponin  Sepsis (HCC)  Cellulitis  Prolonged QT interval  CAP (community acquired pneumonia)  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[place]) Will notify nephrology that pt has been admitted No acute indication for HD today   Acute hyponatremia If able to obtain urine can check urine elctrolytes  Diarrhea Order gastric panel c.dif  Paroxysmal atrial fibrillation (HCC) Continue amiodarone 200 mg po qday  Hypertension Allow permissive htn  Hyperlipidemia Order lipitor 6m po qday  GERD (gastroesophageal reflux disease) While diarrhea hold protonix  Debility Pt ot eval prior to dc  Alcoholic cirrhosis of liver  without ascites (HCC) Chronic Weight this MELD score 20.  Continue to monitor no longer drinks  CAD (coronary artery disease) Chronic stable given possible sepsis hold Coreg restart Lipitor 20 mg p.o. daily  Elevated troponin Unclear etiology not currently endorsing chest pain we will continue to cycle obtain echogram cardiology consult in a.m. or sooner if decompensates If continues to trend up would anticoagulate  [place]epsis ([place]  -SIRS criteria met with   tachycardia   , fever RR >20 Today's Vitals   02/21/22 1958 02/21/22 1959 02/21/22 2301 02/22/22 0038  BP:   (!) 129/59 (!) 117/50  Pulse:   90 92  Resp:   (!) 22 16  Temp: 98.1 F (36.7 C)  98.6 F (37 C)   TempSrc: Oral     SpO2:   95% 93%  Weight:  77.6 kg    Height:  6' (1.829 m)    PainSc:  0-No pain 0-No pain 0-No pain   Body mass index is 23.19 kg/m.   The recent clinical data is shown below. Vitals:   02/21/22 1958 02/21/22 1959 02/21/22 2301 02/22/22 0038  BP:   (!) 129/59 (!) 117/50  Pulse:   90 92  Resp:   (!) 22 16  Temp: 98.1 F (36.7 C)  98.6 F (37 C)   TempSrc: Oral     SpO2:   95% 93%  Weight:  77.6 kg    Height:  6' (1.829 m)      -Most likely source being: pulmonary, intra-abdominal,     elevated lactic acid >2     Component Value Date/Time   LATICACIDVEN 2.0 (HH) 02/21/2022 27622   acute metabolic encephalopathy      Component Value Date/Time   PLT 83 (L) 02/21/2022 2009   PLT 163 06/03/2020 1217     - Obtain serial lactic acid and procalcitonin level.  - Initiated IV antibiotics in ER: Antibiotics Given (last 72 hours)     Date/Time Action Medication Dose Rate   02/21/22 2314 New Bag/Given   ceFEPIme (MAXIPIME) 2 g in sodium chloride 0.9 % 100 mL IVPB 2 g 200 mL/hr   02/22/22 0036 New Bag/Given   vancomycin (VANCOREADY) IVPB 1500 mg/300 mL 1,500 mg 150 mL/hr       Will continue  on : cafepime/vanc   - await results of blood and urine culture  - Rehydrate  Intravenous  fluids were administered, {     12:54 AM   Cellulitis Left foot appears cellulitic w swelling and warmth Obtain plain images  check for MRSA  contiune cefepime and vanc for now     Prolonged QT interval - will monitor on tele avoid QT prolonging medications, rehydrate correct electrolytes   CAP (community acquired pneumonia)  - -Patient presenting with  feve  and infiltrate in  -Infiltrate on CXR and 2-3 characteristics (fever, leukocytosis, purulent sputum) are consistent with pneumonia. -This appears to be most likely community-acquired pneumonia.      will admit for  treatment of CAP will start on appropriate antibiotic coverage. -cefepime and vanc   Obtain:  sputum cultures,                  Obtain respiratory panel                   influenza serologies negative                  COVID PCR negative                    blood cultures and sputum cultures ordered                   strep pneumo UA antigen,                  check for Legionella antigen.                Provide oxygen as needed.      Other plan as per orders.  DVT prophylaxis:  SCD    Code Status:    Code Status: Prior  DNR/DNI   as per patient   I had personally discussed CODE STATUS with patient      Family Communication:   Family not at  Bedside    Disposition Plan:                             Back to current facility when stable                            Following barriers for discharge:                            Electrolytes corrected                                                         Will need consultants to evaluate patient prior to discharge                     Would benefit from PT/OT eval prior to DC  Ordered                   Swallow eval - SLP ordered                                      Transition of care consulted                   Nutrition    consulted                                      Consults called: ER sent msg to nephrolgy  Send email to cardiology   Admission  status:  ED Disposition     ED Disposition  [place]: [place] [[id number]]  Level of Care: Progressive [102]  Admit to Progressive based on following criteria: MULTISYSTEM  THREATS such as stable sepsis, metabolic/electrolyte imbalance with or without encephalopathy that is responding to early treatment.  May [facility] to [facility] or [facility] if equivalent level of care is available:: No  Covid Evaluation: Confirmed COVID Negative  Diagnosis: Sepsis [facility]) [[id number]]  Admitting Physician: Toy Baker [[id number]]  Attending Physician: Toy Baker [[id number]]  Certification:: I certify this patient will need inpatient services for at least 2 midnights  Estimated Length of Stay: 2           inpatient     I Expect 2 midnight stay secondary to severity of patient's current illness need for inpatient interventions justified by the following:  hemodynamic instability despite optimal treatment (tachycardia  )  Severe lab/radiological/exam abnormalities including:    sepsis and extensive comorbidities including:  CKD   That are currently affecting medical management.   I expect  patient to be hospitalized for 2 midnights requiring inpatient medical care.  Patient is at high risk for adverse outcome (such as loss of life or disability) if not treated.  Indication for inpatient stay as follows:    severe pain requiring acute inpatient management,      Need for IV antibiotics,  HD    Level of care       progressive tele indefinitely please discontinue once patient no longer qualifies COVID-19 Labs    Lab Results  Component Value Date   [place] 02/21/2022     Precautions: admitted as   Covid Negative       Darryl Diaz 02/22/2022, 1:42 AM    [facility]     after 2 AM please page floor coverage PA If 7AM-7PM, please contact the day team taking care of the patient  using [email or web address]   Patient was evaluated in the context of the global COVID-19 pandemic, which necessitated consideration that the patient might be at risk for infection with the SARS-CoV-2 virus that causes COVID-19. Institutional protocols and algorithms that pertain to the evaluation of patients at risk for COVID-19 are in a state of rapid change based on information released by regulatory bodies including the CDC and federal and state organizations. These policies and algorithms were followed during the patient's care.

## 2022-02-22 NOTE — Consult Note (Signed)
Reason for Consult:Left foot osteo Referring Physician: Cordelia Poche Time called: [id number] Time at bedside: [name]  HPI: Darryl Diaz was admitted for bactermia after coming in for weakness and diarrhea. 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 He denies pain in the foot.  Past Medical History:  Diagnosis Date   Alcohol abuse 25/85/2778   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[name]) 03/17/2020   Alcoholism ([place])    in remission following wife's death   Anemia    Anxiety    Ascending aorta dilation (HCC)    Atrial fibrillation (HCC)    Cardiac arrest ([place]) 04/20/2019   12 min CPR with epi   Chronic diastolic CHF (congestive heart failure) ([place])    Dysrhythmia    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[place])    Mon Wed Fri   Fatty liver    Fracture of hip ([place]) 03/30/2016   Gastritis    GERD (gastroesophageal reflux disease)    H/O seasonal allergies    Heme positive stool 05/14/2020   History of blood transfusion    History of GI bleed    Hx of adenomatous colonic polyps 08/10/2017   Hyperlipidemia    Hypertension    Insomnia    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   Mallory-Weiss tear    OA (osteoarthritis)    hands   OSA (obstructive sleep apnea)    No longer uses CPAP   Persistent atrial fibrillation with rapid ventricular response ([place]) 02/05/2020   Posterior dislocation of hip, closed ([place]) 04/30/2016   Recurrent syncope    Restless legs syndrome (RLS)    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([place])     Past Surgical History:  Procedure Laterality Date   AMPUTATION Left 05/19/2021   Procedure: LEFT GREAT TOE AMPUTATION;  Surgeon: Newt Minion, MD;  Location: [facility];  Service: Orthopedics;  Laterality: Left;   ANKLE SURGERY Left 2014   had rods put in    AV FISTULA PLACEMENT Left 04/21/2021   Procedure: LEFT ARM ARTERIOVENOUS (AV) FISTULA CREATION - Left brachiocephalic  fistula;  Surgeon: Marty Heck, MD;  Location: [facility];  Service: Vascular;  Laterality: Left;   BIOPSY  02/28/2019   Procedure: BIOPSY;  Surgeon: Milus Banister, MD;  Location: [facility];  Service: Endoscopy;;   CARDIOVERSION  02/06/2020   CARDIOVERSION N/A 02/06/2020   Procedure: CARDIOVERSION;  Surgeon: Werner Lean, MD;  Location: [facility]OSCOPY;  Service: Cardiovascular;  Laterality: N/A;   CARDIOVERSION N/A 02/24/2020   Procedure: CARDIOVERSION;  Surgeon: Werner Lean, MD;  Location: [facility];  Service: Cardiovascular;  Laterality: N/A;   COLONOSCOPY     [place]   COLONOSCOPY WITH PROPOFOL N/A 01/08/2021   Procedure: COLONOSCOPY WITH PROPOFOL;  Surgeon: Milus Banister, MD;  [facility];  Service: Endoscopy;  Laterality: N/A;   ESOPHAGOGASTRODUODENOSCOPY (EGD) WITH PROPOFOL N/A 02/28/2019   Procedure: ESOPHAGOGASTRODUODENOSCOPY (EGD) WITH PROPOFOL;  Surgeon: Milus Banister, MD;  Location[facility] ENDOSCOPY;  Service: Endoscopy;  Laterality: N/A;   FRACTURE SURGERY     HIP ARTHROPLASTY Left 04/01/2016   Procedure: ARTHROPLASTY BIPOLAR HIP (HEMIARTHROPLASTY);  Surgeon: Paralee Cancel, MD;  Location: [facility];  Service: Orthopedics;  Laterality: Left;   HIP CLOSED REDUCTION Left 04/30/2016   Procedure: CLOSED REDUCTION HIP;  Surgeon: Wylene Simmer, MD;  Location: [facility];  Service: Orthopedics;  Laterality:  Left;   HOT HEMOSTASIS N/A 01/08/2021   Procedure: HOT HEMOSTASIS (ARGON PLASMA COAGULATION/BICAP);  Surgeon: Milus Banister, MD;  Location[facility] ENDOSCOPY;  Service: Endoscopy;  Laterality: N/A;   INSERTION OF DIALYSIS CATHETER N/A 01/25/2022   Procedure: INSERTION OF TUNNELED DIALYSIS CATHETER;  Surgeon: Angelia Mould, MD;  Location: [facility];  Service: Vascular;  Laterality: N/A;   IR RADIOLOGIST EVAL & MGMT  12/31/2020   JOINT REPLACEMENT     TONSILLECTOMY     TOTAL HIP ARTHROPLASTY Right 10/15/2019   Procedure: TOTAL HIP ARTHROPLASTY  ANTERIOR APPROACH;  Surgeon: Paralee Cancel, MD;  Location: [facility];  Service: Orthopedics;  Laterality: Right;  70 mins   VASCULAR SURGERY      Family History  Problem Relation Age of Onset   Heart disease Mother 74   Hypertension Mother    Arthritis Mother    Heart failure Father 98   Stroke Maternal Aunt    Heart failure Sister    Colon cancer Neg Hx    Esophageal cancer Neg Hx    Stomach cancer Neg Hx    Rectal cancer Neg Hx     Social History:  reports that he has quit smoking. His smoking use included cigarettes. He has a 20.00 pack-year smoking history. He has never used smokeless tobacco. He reports that he does not currently use alcohol after a past usage of about 3.0 standard drinks of alcohol per week. He reports that he does not use drugs.  Allergies:  Allergies  Allergen Reactions   Penicillin G Sodium Rash and Other (See Comments)     (tolerates Keflex)  NOT DOCUMENTED ON THE MAR   Penicillins Rash    NOT DOCUMENTED ON THE MAR    Medications: I have reviewed the patient's current medications.  Results for orders placed or performed during the hospital encounter of 02/21/22 (from the past 48 hour(s))  Lactic acid, plasma     Status: Abnormal   Collection Time: 02/21/22  8:09 PM  Result Value Ref Range   Lactic Acid, Venous 2.8 (HH) 0.5 - 1.9 mmol/L    Comment: CRITICAL RESULT CALLED TO, READ BACK BY AND VERIFIED WITH [name], 2113, 02/21/22, EADEDOKUN Performed at [facility], [place] [address], [place], [place] [address]   Comprehensive metabolic panel     Status: Abnormal   Collection Time: 02/21/22  8:09 PM  Result Value Ref Range   Sodium 137 135 - 145 mmol/L    Comment: DELTA CHECK NOTED   Potassium 3.2 (L) 3.5 - 5.1 mmol/L   Chloride 98 98 - 111 mmol/L   CO2 25 22 - 32 mmol/L   Glucose, Bld 92 70 - 99 mg/dL    Comment: Glucose reference range applies only to samples taken after fasting for at least 8 hours.   BUN 20 8 - 23 mg/dL    Creatinine, Ser 2.05 (H) 0.61 - 1.24 mg/dL   Calcium 8.2 (L) 8.9 - 10.3 mg/dL   Total Protein 5.2 (L) 6.5 - 8.1 g/dL   Albumin 2.6 (L) 3.5 - 5.0 g/dL   AST 40 15 - 41 U/L   ALT 17 0 - 44 U/L   Alkaline Phosphatase 76 38 - 126 U/L   Total Bilirubin 1.6 (H) 0.3 - 1.2 mg/dL   GFR, Estimated 32 (L) >60 mL/min    Comment: (NOTE) Calculated using the CKD-EPI Creatinine Equation (2021)    Anion gap 14 5 - 15    Comment:  Performed at [facility], [place] [address] [place], [place] [address]  CBC with Differential     Status: Abnormal   Collection Time: 02/21/22  8:09 PM  Result Value Ref Range   WBC 7.9 4.0 - 10.5 K/uL   RBC 2.37 (L) 4.22 - 5.81 MIL/uL   Hemoglobin 8.4 (L) 13.0 - 17.0 g/dL   HCT 23.9 (L) 39.0 - 52.0 %   MCV 100.8 (H) 80.0 - 100.0 fL   MCH 35.4 (H) 26.0 - 34.0 pg   MCHC 35.1 30.0 - 36.0 g/dL   RDW 14.6 11.5 - 15.5 %   Platelets 83 (L) 150 - 400 K/uL    Comment: REPEATED TO VERIFY   nRBC 0.0 0.0 - 0.2 %   Neutrophils Relative % 86 %   Neutro Abs 6.8 1.7 - 7.7 K/uL   Lymphocytes Relative 3 %   Lymphs Abs 0.2 (L) 0.7 - 4.0 K/uL   Monocytes Relative 7 %   Monocytes Absolute 0.6 0.1 - 1.0 K/uL   Eosinophils Relative 0 %   Eosinophils Absolute 0.0 0.0 - 0.5 K/uL   Basophils Relative 0 %   Basophils Absolute 0.0 0.0 - 0.1 K/uL   Immature Granulocytes 4 %   Abs Immature Granulocytes 0.28 (H) 0.00 - 0.07 K/uL    Comment: Performed at [facility] [address] [place], [place] [address]  Protime-INR     Status: Abnormal   Collection Time: 02/21/22  8:09 PM  Result Value Ref Range   Prothrombin Time 17.3 (H) 11.4 - 15.2 seconds   INR 1.4 (H) 0.8 - 1.2    Comment: (NOTE) INR goal varies based on device and disease states. Performed at [facility], [place] [address], [place], [place] [address]   Troponin I (High Sensitivity)     Status: Abnormal   Collection Time: 02/21/22  8:09 PM  Result Value Ref Range   Troponin I (High Sensitivity) 375 (HH) <18  ng/L    Comment: CRITICAL RESULT CALLED TO, READ BACK BY AND VERIFIED WITH J. [facility], [place], 2124, 02/21/22, EADEDOKUN (NOTE) Elevated high sensitivity troponin I (hsTnI) values and significant  changes across serial measurements may suggest ACS but many other  chronic and acute conditions are known to elevate hsTnI results.  Refer to the "Links" section for chest pain algorithms and additional  guidance. Performed at [facility], [place] [address], [place], [place] [address]   Magnesium     Status: None   Collection Time: 02/21/22  8:09 PM  Result Value Ref Range   Magnesium 1.7 1.7 - 2.4 mg/dL    Comment: Performed at [facility], [place] [address], [place], [place] [address]  Blood Culture (routine x 2)     Status: None (Preliminary result)   Collection Time: 02/21/22  8:42 PM   Specimen: BLOOD RIGHT FOREARM  Result Value Ref Range   Specimen Description BLOOD RIGHT FOREARM    Special Requests      BOTTLES DRAWN AEROBIC AND ANAEROBIC Blood Culture adequate volume   Culture  Setup Time      GRAM POSITIVE COCCI IN CLUSTERS IN BOTH AEROBIC AND ANAEROBIC BOTTLES CRITICAL VALUE NOTED.  VALUE IS CONSISTENT WITH PREVIOUSLY REPORTED AND CALLED VALUE. Performed at [facility], [place] [address] [place], [place] [address]    Culture GRAM POSITIVE COCCI    Report Status PENDING   Blood Culture (routine x 2)     Status:  None (Preliminary result)   Collection Time: 02/21/22  8:49 PM   Specimen: BLOOD  Result Value Ref Range   Specimen Description BLOOD RIGHT ANTECUBITAL    Special Requests      BOTTLES DRAWN AEROBIC AND ANAEROBIC Blood Culture adequate volume   Culture  Setup Time      GRAM POSITIVE COCCI IN CLUSTERS IN BOTH AEROBIC AND ANAEROBIC BOTTLES CRITICAL RESULT CALLED TO, READ BACK BY AND VERIFIED WITH: PHARMD E [place] [id number] AT 1234 BY CM Performed at [facility], [place] [address] [place], [place] [address]    Culture GRAM POSITIVE COCCI    Report Status  PENDING   Lactic acid, plasma     Status: Abnormal   Collection Time: 02/21/22 11:00 PM  Result Value Ref Range   Lactic Acid, Venous 2.0 (HH) 0.5 - 1.9 mmol/L    Comment: CRITICAL VALUE NOTED. VALUE IS CONSISTENT WITH PREVIOUSLY REPORTED/CALLED VALUE Performed at [facility], [place] [address], [place], [place] [address]   Troponin I (High Sensitivity)     Status: Abnormal   Collection Time: 02/21/22 11:00 PM  Result Value Ref Range   Troponin I (High Sensitivity) 346 (HH) <18 ng/L    Comment: DELTA CHECK NOTED CRITICAL VALUE NOTED. VALUE IS CONSISTENT WITH PREVIOUSLY REPORTED/CALLED VALUE (NOTE) Elevated high sensitivity troponin I (hsTnI) values and significant  changes across serial measurements may suggest ACS but many other  chronic and acute conditions are known to elevate hsTnI results.  Refer to the "Links" section for chest pain algorithms and additional  guidance. Performed at [facility], [place] [address], [place], [place] [address]   Ammonia     Status: None   Collection Time: 02/22/22  2:30 AM  Result Value Ref Range   Ammonia 13 9 - 35 umol/L    Comment: Performed at [facility], [place] [address], [place], [place] [address]  CK     Status: None   Collection Time: 02/22/22  2:30 AM  Result Value Ref Range   Total CK 256 49 - 397 U/L    Comment: Performed at [facility], [place] [address] [place], [place] [address]  Lactic acid, plasma     Status: None   Collection Time: 02/22/22  2:30 AM  Result Value Ref Range   Lactic Acid, Venous 1.4 0.5 - 1.9 mmol/L    Comment: Performed at [facility] [address], [place], [place] [address]  Osmolality     Status: None   Collection Time: 02/22/22  2:30 AM  Result Value Ref Range   Osmolality 282 275 - 295 mOsm/kg    Comment: Performed at [facility], [place]., [place], [place] [address]  Phosphorus     Status: Abnormal   Collection Time: 02/22/22  2:30 AM  Result Value Ref  Range   Phosphorus 2.4 (L) 2.5 - 4.6 mg/dL    Comment: Performed at [facility], [place] [address] [place], [place] [address]  Troponin I (High Sensitivity)     Status: Abnormal   Collection Time: 02/22/22  2:30 AM  Result Value Ref Range   Troponin I (High Sensitivity) 524 (HH) <18 ng/L    Comment: DELTA CHECK NOTED CRITICAL VALUE NOTED. VALUE IS CONSISTENT WITH PREVIOUSLY REPORTED/CALLED VALUE (NOTE) Elevated high sensitivity troponin I (hsTnI) values and significant  changes across serial measurements may suggest ACS but many other  chronic and acute conditions are known  to elevate hsTnI results.  Refer to the "Links" section for chest pain algorithms and additional  guidance. Performed at [facility], [place] [address], [place], [place] [address]   Vitamin B12     Status: None   Collection Time: 02/22/22  2:30 AM  Result Value Ref Range   Vitamin B-12 826 180 - 914 pg/mL    Comment: (NOTE) This assay is not validated for testing neonatal or myeloproliferative syndrome specimens for Vitamin B12 levels. Performed at [facility], [place] [address] [place], [place] [address]   Iron and TIBC     Status: Abnormal   Collection Time: 02/22/22  2:30 AM  Result Value Ref Range   Iron 15 (L) 45 - 182 ug/dL   TIBC 150 (L) 250 - 450 ug/dL   Saturation Ratios 10 (L) 17.9 - 39.5 %   UIBC 135 ug/dL    Comment: Performed at [facility], [place] [address] [place], [place] [address]  Ferritin     Status: Abnormal   Collection Time: 02/22/22  2:30 AM  Result Value Ref Range   Ferritin 1,703 (H) 24 - 336 ng/mL    Comment: Performed at [facility], [place] [address], [place], [place] [address]  Type and screen [place]     Status: None   Collection Time: 02/22/22  2:30 AM  Result Value Ref Range   ABO/RH(D) O POS    Antibody Screen NEG    Sample Expiration      02/25/2022,2359 Performed at [facility], [place] [address], [place],  [place] [address]   HIV Antibody (routine testing w rflx)     Status: None   Collection Time: 02/22/22  2:30 AM  Result Value Ref Range   HIV Screen 4th Generation wRfx Non Reactive Non Reactive    Comment: Performed at [facility], [place] [address], [place], [place] [address]  Procalcitonin - Baseline     Status: None   Collection Time: 02/22/22  2:30 AM  Result Value Ref Range   Procalcitonin 8.80 ng/mL    Comment:        Interpretation: PCT > 2 ng/mL: Systemic infection (sepsis) is likely, unless other causes are known. (NOTE)       Sepsis PCT Algorithm           Lower Respiratory Tract                                      Infection PCT Algorithm    ----------------------------     ----------------------------         PCT < 0.25 ng/mL                PCT < 0.10 ng/mL          Strongly encourage             Strongly discourage   discontinuation of antibiotics    initiation of antibiotics    ----------------------------     -----------------------------       PCT 0.25 - 0.50 ng/mL            PCT 0.10 - 0.25 ng/mL               OR       >80% decrease in PCT  Discourage initiation of                                            antibiotics      Encourage discontinuation           of antibiotics    ----------------------------     -----------------------------         PCT >= 0.50 ng/mL              PCT 0.26 - 0.50 ng/mL               AND       <80% decrease in PCT              Encourage initiation of                                             antibiotics       Encourage continuation           of antibiotics    ----------------------------     -----------------------------        PCT >= 0.50 ng/mL                  PCT > 0.50 ng/mL               AND         increase in PCT                  Strongly encourage                                      initiation of antibiotics    Strongly encourage escalation           of antibiotics                                      -----------------------------                                           PCT <= 0.25 ng/mL                                                 OR                                        > 80% decrease in PCT                                      Discontinue / Do not initiate  antibiotics  Performed at [facility], [place] [address], [place], [place] [address]   APTT     Status: Abnormal   Collection Time: 02/22/22  2:30 AM  Result Value Ref Range   aPTT 42 (H) 24 - 36 seconds    Comment:        IF BASELINE aPTT IS ELEVATED, SUGGEST PATIENT RISK ASSESSMENT BE USED TO DETERMINE APPROPRIATE ANTICOAGULANT THERAPY. Performed at [facility], [place] [address] [place], [place] [address]   Protime-INR     Status: Abnormal   Collection Time: 02/22/22  2:30 AM  Result Value Ref Range   Prothrombin Time 17.1 (H) 11.4 - 15.2 seconds   INR 1.4 (H) 0.8 - 1.2    Comment: (NOTE) INR goal varies based on device and disease states. Performed at [facility], [place] [address] [place], [place] [address]   Comprehensive metabolic panel     Status: Abnormal   Collection Time: 02/22/22  2:30 AM  Result Value Ref Range   Sodium 135 135 - 145 mmol/L   Potassium 3.0 (L) 3.5 - 5.1 mmol/L   Chloride 100 98 - 111 mmol/L   CO2 25 22 - 32 mmol/L   Glucose, Bld 96 70 - 99 mg/dL    Comment: Glucose reference range applies only to samples taken after fasting for at least 8 hours.   BUN 24 (H) 8 - 23 mg/dL   Creatinine, Ser 2.32 (H) 0.61 - 1.24 mg/dL   Calcium 7.9 (L) 8.9 - 10.3 mg/dL   Total Protein 4.8 (L) 6.5 - 8.1 g/dL   Albumin 2.4 (L) 3.5 - 5.0 g/dL   AST 37 15 - 41 U/L   ALT 19 0 - 44 U/L   Alkaline Phosphatase 62 38 - 126 U/L   Total Bilirubin 1.6 (H) 0.3 - 1.2 mg/dL   GFR, Estimated 28 (L) >60 mL/min    Comment: (NOTE) Calculated using the CKD-EPI Creatinine Equation (2021)    Anion gap 10 5 - 15    Comment: Performed at [facility], [place] [address] [place], [place] [address]  Magnesium     Status: None   Collection Time: 02/22/22  2:30 AM  Result Value Ref Range   Magnesium 1.7 1.7 - 2.4 mg/dL    Comment: Performed at [facility] [address], [place], [place] [address]  Folate     Status: None   Collection Time: 02/22/22  2:30 AM  Result Value Ref Range   Folate 20.8 >5.9 ng/mL    Comment: Performed at [facility] [address] [place], [place] [address]  Prealbumin     Status: Abnormal   Collection Time: 02/22/22  2:30 AM  Result Value Ref Range   Prealbumin 7 (L) 18 - 38 mg/dL    Comment: Performed at [facility] [address], [place], [place] [address]  TSH     Status: None   Collection Time: 02/22/22  2:30 AM  Result Value Ref Range   TSH 2.489 0.350 - 4.500 uIU/mL    Comment: Performed by a 3rd Generation assay with a functional sensitivity of <=0.01 uIU/mL. Performed at [facility], [place] [address], [place], [place] [address]   I-Stat venous blood gas, ED     Status: Abnormal   Collection Time: 02/22/22  2:43 AM  Result Value Ref Range   pH, Ven 7.568 (H) 7.25 - 7.43  pCO2, Ven 27.6 (L) 44 - 60 mmHg   pO2, Ven 20 (LL) 32 - 45 mmHg   Bicarbonate 25.2 20.0 - 28.0 mmol/L   TCO2 26 22 - 32 mmol/L   O2 Saturation 43 %   Acid-Base Excess 3.0 (H) 0.0 - 2.0 mmol/L   Sodium 135 135 - 145 mmol/L   Potassium 2.9 (L) 3.5 - 5.1 mmol/L   Calcium, Ion 1.03 (L) 1.15 - 1.40 mmol/L   HCT 21.0 (L) 39.0 - 52.0 %   Hemoglobin 7.1 (L) 13.0 - 17.0 g/dL   Sample type VENOUS    Comment NOTIFIED PHYSICIAN   Resp Panel by RT-PCR (Flu A&B, Covid) Anterior Nasal Swab     Status: None   Collection Time: 02/22/22  3:28 AM   Specimen: Anterior Nasal Swab  Result Value Ref Range   SARS Coronavirus 2 by RT PCR NEGATIVE NEGATIVE    Comment: (NOTE) SARS-CoV-2 target nucleic acids are NOT DETECTED.  The SARS-CoV-2 RNA is generally detectable in upper respiratory specimens during  the acute phase of infection. The lowest concentration of SARS-CoV-2 viral copies this assay can detect is 138 copies/mL. A negative result does not preclude SARS-Cov-2 infection and should not be used as the sole basis for treatment or other patient management decisions. A negative result may occur with  improper specimen collection/handling, submission of specimen other than nasopharyngeal swab, presence of viral mutation(s) within the areas targeted by this assay, and inadequate number of viral copies(<138 copies/mL). A negative result must be combined with clinical observations, patient history, and epidemiological information. The expected result is Negative.  Fact Sheet for Patients:  EntrepreneurPulse.com.au  Fact Sheet for Healthcare Providers:  IncredibleEmployment.be  This test is no t yet approved or cleared by the [place] FDA and  has been authorized for detection and/or diagnosis of SARS-CoV-2 by FDA under an Emergency Use Authorization (EUA). This EUA will remain  in effect (meaning this test can be used) for the duration of the COVID-19 declaration under Section 564(b)(1) of the Act, 21 U.S.C.section 360bbb-3(b)(1), unless the authorization is terminated  or revoked sooner.       Influenza A by PCR NEGATIVE NEGATIVE   Influenza B by PCR NEGATIVE NEGATIVE    Comment: (NOTE) The Xpert Xpress SARS-CoV-2/FLU/RSV plus assay is intended as an aid in the diagnosis of influenza from Nasopharyngeal swab specimens and should not be used as a sole basis for treatment. Nasal washings and aspirates are unacceptable for Xpert Xpress SARS-CoV-2/FLU/RSV testing.  Fact Sheet for Patients: EntrepreneurPulse.com.au  Fact Sheet for Healthcare Providers: IncredibleEmployment.be  This test is not yet approved or cleared by the [place] FDA and has been authorized for detection and/or diagnosis of  SARS-CoV-2 by FDA under an Emergency Use Authorization (EUA). This EUA will remain in effect (meaning this test can be used) for the duration of the COVID-19 declaration under Section 564(b)(1) of the Act, 21 U.S.C. section 360bbb-3(b)(1), unless the authorization is terminated or revoked.  Performed at [facility], [place] [address], [place], [place] [address]   Creatinine, urine, random     Status: None   Collection Time: 02/22/22  3:28 AM  Result Value Ref Range   Creatinine, Urine 105 mg/dL    Comment: Performed at [facility] [address] [place], [place] [address]  Sodium, urine, random     Status: None   Collection Time: 02/22/22  3:28 AM  Result Value Ref Range   Sodium, Ur 11  mmol/L    Comment: Performed at [facility], [place] [address], [place], [place] [address]  Strep pneumoniae urinary antigen     Status: None   Collection Time: 02/22/22  3:28 AM  Result Value Ref Range   Strep Pneumo Urinary Antigen NEGATIVE NEGATIVE    Comment:        Infection due to S. pneumoniae cannot be absolutely ruled out since the antigen present may be below the detection limit of the test. Performed at [facility], [place] [address] [place], [place] [address]   Respiratory (~20 pathogens) panel by PCR     Status: None   Collection Time: 02/22/22  3:28 AM   Specimen: Urine, Clean Catch; Respiratory  Result Value Ref Range   Adenovirus NOT DETECTED NOT DETECTED   Coronavirus 229E NOT DETECTED NOT DETECTED    Comment: (NOTE) The Coronavirus on the Respiratory Panel, DOES NOT test for the novel  Coronavirus (2019 nCoV)    Coronavirus HKU1 NOT DETECTED NOT DETECTED   Coronavirus NL63 NOT DETECTED NOT DETECTED   Coronavirus OC43 NOT DETECTED NOT DETECTED   Metapneumovirus NOT DETECTED NOT DETECTED   Rhinovirus / Enterovirus NOT DETECTED NOT DETECTED   Influenza A NOT DETECTED NOT DETECTED   Influenza B NOT DETECTED NOT DETECTED   Parainfluenza Virus 1 NOT  DETECTED NOT DETECTED   Parainfluenza Virus 2 NOT DETECTED NOT DETECTED   Parainfluenza Virus 3 NOT DETECTED NOT DETECTED   Parainfluenza Virus 4 NOT DETECTED NOT DETECTED   Respiratory Syncytial Virus NOT DETECTED NOT DETECTED   Bordetella pertussis NOT DETECTED NOT DETECTED   Bordetella Parapertussis NOT DETECTED NOT DETECTED   Chlamydophila pneumoniae NOT DETECTED NOT DETECTED   Mycoplasma pneumoniae NOT DETECTED NOT DETECTED    Comment: Performed at [facility], [place] [address], [place], [place] [address]  Urinalysis, Complete w Microscopic Urine, Clean Catch     Status: Abnormal   Collection Time: 02/22/22  3:29 AM  Result Value Ref Range   Color, Urine AMBER (A) YELLOW    Comment: BIOCHEMICALS MAY BE AFFECTED BY COLOR   APPearance HAZY (A) CLEAR   Specific Gravity, Urine 1.013 1.005 - 1.030   pH 5.0 5.0 - 8.0   Glucose, UA NEGATIVE NEGATIVE mg/dL   Hgb urine dipstick MODERATE (A) NEGATIVE   Bilirubin Urine NEGATIVE NEGATIVE   Ketones, ur NEGATIVE NEGATIVE mg/dL   Protein, ur 30 (A) NEGATIVE mg/dL   Nitrite NEGATIVE NEGATIVE   Leukocytes,Ua NEGATIVE NEGATIVE   RBC / HPF 11-20 0 - 5 RBC/hpf   WBC, UA 0-5 0 - 5 WBC/hpf   Bacteria, UA RARE (A) NONE SEEN   Squamous Epithelial / LPF 0-5 0 - 5   Mucus PRESENT    Hyaline Casts, UA PRESENT     Comment: Performed at [facility], [address], [place], [place] [address]  MRSA Next Gen by PCR, Nasal     Status: None   Collection Time: 02/22/22  3:33 AM   Specimen: STOOL; Nasal Swab  Result Value Ref Range   MRSA by PCR Next Gen NOT DETECTED NOT DETECTED    Comment: (NOTE) The GeneXpert MRSA Assay (FDA approved for NASAL specimens only), is one component of a comprehensive MRSA colonization surveillance program. It is not intended to diagnose MRSA infection nor to guide or monitor treatment for MRSA infections. Test performance is not FDA approved in patients less than 72 years old. Performed at [facility], [address] [place], [place] [address]   Hepatitis B surface antigen     Status: None   Collection Time: 02/22/22  6:10 AM  Result Value Ref Range   Hepatitis B Surface Ag NON REACTIVE NON REACTIVE    Comment: Performed at [facility] [address] [place], [place] [address]  Hepatitis B surface antibody     Status: None   Collection Time: 02/22/22  6:10 AM  Result Value Ref Range   Hep B S Ab NON REACTIVE NON REACTIVE    Comment: (NOTE) Inconsistent with immunity, less than 10 mIU/mL.  Performed at [facility], [place] [address] [place], [place] [address]   Hepatitis B core antibody, total     Status: None   Collection Time: 02/22/22  6:10 AM  Result Value Ref Range   Hep B Core Total Ab NON REACTIVE NON REACTIVE    Comment: Performed at [facility] [address], [place], [place] [address]  Hepatitis C antibody     Status: None   Collection Time: 02/22/22  6:10 AM  Result Value Ref Range   HCV Ab NON REACTIVE NON REACTIVE    Comment: (NOTE) Nonreactive HCV antibody screen is consistent with no HCV infections,  unless recent infection is suspected or other evidence exists to indicate HCV infection.  Performed at [facility], [place] [address] [place], [place] [address]   Hepatitis B surface antigen     Status: None   Collection Time: 02/22/22  9:33 AM  Result Value Ref Range   Hepatitis B Surface Ag NON REACTIVE NON REACTIVE    Comment: Performed at [facility] [address] [place], [place] [address]    ECHOCARDIOGRAM COMPLETE  Result Date: 02/22/2022    ECHOCARDIOGRAM REPORT   Patient Name:   Darryl Diaz Date of Exam: 02/22/2022 Medical Rec #:  [id number]        Height:       72.0 in Accession #:    [id number]       Weight:       171.0 lb Date of Birth:  March 28, 1941        BSA:          1.993 m Patient Age:    23 years         BP:           110/43 mmHg Patient Gender: M                HR:           124 bpm. Exam Location:   Inpatient Procedure: 2D Echo, Cardiac Doppler and Color Doppler Indications:    elevated trops  History:        Patient has prior history of Echocardiogram examinations, most                 recent 01/17/2021. CAD, Arrythmias:Bradycardia; Risk                 Factors:Hypertension and Dyslipidemia.  Sonographer:    Melissa Morford RDCS (AE, PE) Referring Phys: [place]  1. Left ventricular ejection fraction, by estimation, is 60 to 65%. The left ventricle has normal function. The left ventricle has no regional wall motion abnormalities. Left ventricular diastolic parameters are consistent with Grade I diastolic dysfunction (impaired relaxation). Elevated left ventricular end-diastolic pressure. The E/e' is 39.  2. Right ventricular systolic function is  normal. The right ventricular size is normal. There is normal pulmonary artery systolic pressure. The estimated right ventricular systolic pressure is 79.8 mmHg.  3. Left atrial size was moderately dilated.  4. The mitral valve is abnormal. Mild mitral valve regurgitation. Moderate mitral annular calcification.  5. The aortic valve is tricuspid. Aortic valve regurgitation is not visualized. Mild aortic valve stenosis. Aortic valve area, by VTI measures 2.45 cm. Aortic valve mean gradient measures 12.5 mmHg. Aortic valve Vmax measures 2.18 m/s.  6. Aortic dilatation noted. There is borderline dilatation of the ascending aorta, measuring 39 mm.  7. The inferior vena cava is dilated in size with <50% respiratory variability, suggesting right atrial pressure of 15 mmHg.  8. Cannot exclude a small PFO. Comparison(s): Changes from prior study are noted. 01/17/2021: LVEF 60-65%, mild AS - mean gradient 12 mmHg. FINDINGS  Left Ventricle: Left ventricular ejection fraction, by estimation, is 60 to 65%. The left ventricle has normal function. The left ventricle has no regional wall motion abnormalities. The left ventricular internal cavity size was  normal in size. There is  no left ventricular hypertrophy. Left ventricular diastolic parameters are consistent with Grade I diastolic dysfunction (impaired relaxation). Elevated left ventricular end-diastolic pressure. The E/e' is 15. Right Ventricle: The right ventricular size is normal. No increase in right ventricular wall thickness. Right ventricular systolic function is normal. There is normal pulmonary artery systolic pressure. The tricuspid regurgitant velocity is 1.63 m/s, and  with an assumed right atrial pressure of 15 mmHg, the estimated right ventricular systolic pressure is 92.1 mmHg. Left Atrium: Left atrial size was moderately dilated. Right Atrium: Right atrial size was normal in size. Pericardium: There is no evidence of pericardial effusion. Mitral Valve: The mitral valve is abnormal. There is moderate calcification of the posterior mitral valve leaflet(s). Moderate mitral annular calcification. Mild mitral valve regurgitation. Tricuspid Valve: The tricuspid valve is grossly normal. Tricuspid valve regurgitation is trivial. Aortic Valve: The aortic valve is tricuspid. Aortic valve regurgitation is not visualized. Mild aortic stenosis is present. Aortic valve mean gradient measures 12.5 mmHg. Aortic valve peak gradient measures 19.0 mmHg. Aortic valve area, by VTI measures 2.45 cm. Pulmonic Valve: The pulmonic valve was grossly normal. Pulmonic valve regurgitation is trivial. Aorta: Aortic dilatation noted. There is borderline dilatation of the ascending aorta, measuring 39 mm. Venous: The inferior vena cava is dilated in size with less than 50% respiratory variability, suggesting right atrial pressure of 15 mmHg. IAS/Shunts: Cannot exclude a small PFO.  LEFT VENTRICLE PLAX 2D LVIDd:         5.33 cm      Diastology LVIDs:         4.13 cm      LV e' medial:    8.92 cm/s LV PW:         0.93 cm      LV E/e' medial:  13.9 LV IVS:        0.87 cm      LV e' lateral:   6.64 cm/s LVOT diam:     2.10 cm       LV E/e' lateral: 18.7 LV SV:         108 LV SV Index:   54 LVOT Area:     3.46 cm  LV Volumes (MOD) LV vol d, MOD A2C: 143.0 ml LV vol d, MOD A4C: 162.0 ml LV vol s, MOD A2C: 70.6 ml LV vol s, MOD A4C: 65.8 ml LV SV MOD A2C:  72.4 ml LV SV MOD A4C:     162.0 ml LV SV MOD BP:      88.1 ml RIGHT VENTRICLE RV S prime:     11.30 cm/s TAPSE (M-mode): 2.6 cm LEFT ATRIUM             Index        RIGHT ATRIUM           Index LA diam:        4.20 cm 2.11 cm/m   RA Area:     14.50 cm LA Vol (A2C):   77.0 ml 38.63 ml/m  RA Volume:   34.20 ml  17.16 ml/m LA Vol (A4C):   87.3 ml 43.80 ml/m LA Biplane Vol: 87.7 ml 44.00 ml/m  AORTIC VALVE AV Area (Vmax):    2.38 cm AV Area (Vmean):   2.31 cm AV Area (VTI):     2.45 cm AV Vmax:           218.00 cm/s AV Vmean:          167.000 cm/s AV VTI:            0.440 m AV Peak Grad:      19.0 mmHg AV Mean Grad:      12.5 mmHg LVOT Vmax:         150.00 cm/s LVOT Vmean:        111.500 cm/s LVOT VTI:          0.310 m LVOT/AV VTI ratio: 0.71  AORTA Ao Root diam: 3.60 cm Ao Asc diam:  3.90 cm MITRAL VALVE                TRICUSPID VALVE MV Area (PHT): 4.80 cm     TR Peak grad:   10.6 mmHg MV Decel Time: 158 msec     TR Vmax:        163.00 cm/s MV E velocity: 124.00 cm/s MV A velocity: 121.00 cm/s  SHUNTS MV E/A ratio:  1.02         Systemic VTI:  0.31 m                             Systemic Diam: 2.10 cm Lyman Bishop MD Electronically signed by Lyman Bishop MD Signature Date/Time: 02/22/2022/11:01:26 AM    Final    DG Foot 2 Views Left  Result Date: 02/22/2022 CLINICAL DATA:  Cellulitis EXAM: LEFT FOOT - 2 VIEW COMPARISON:  Radiographs 11/02/2021; MRI 06/08/2021; radiographs 06/07/2021 FINDINGS: 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 Osseous destruction at the base of the second proximal phalanx in the head of the second metatarsal consistent with osteomyelitis. Question osteo myelitis of the medial aspect of the fourth metatarsal head versus projection  artifact. Remote posttraumatic deformity about the head of the third metatarsal. 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 Postsurgical changes about the ankle. 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 Question osteomyelitis of the medial aspect of the fourth metatarsal head. Electronically Signed   By: Placido Sou M.D.   On: 02/22/2022 02:04   DG Chest 1 View  Result Date: 02/21/2022 CLINICAL DATA:  Weakness EXAM: CHEST  1 VIEW COMPARISON:  02/20/2022 FINDINGS: Stable cardiomediastinal contours. Slightly low lung volumes with bibasilar atelectasis. Trace effusions. No pneumothorax. Advanced degenerative changes of the left glenohumeral joint. IMPRESSION: Bibasilar atelectasis and trace bilateral pleural effusions. Electronically Signed   By: Hart Carwin  Plundo D.O.   On: 02/21/2022 20:42   CT CHEST ABDOMEN PELVIS WO CONTRAST  Result Date: 02/21/2022 CLINICAL DATA:  81 year old male with history of unintended weight loss and fever. EXAM: CT CHEST, ABDOMEN AND PELVIS WITHOUT CONTRAST TECHNIQUE: Multidetector CT imaging of the chest, abdomen and pelvis was performed following the standard protocol without IV contrast. RADIATION DOSE REDUCTION: This exam was performed according to the departmental dose-optimization program which includes automated exposure control, adjustment of the mA and/or kV according to patient size and/or use of iterative reconstruction technique. COMPARISON:  CT of the abdomen and pelvis 11/19/2021. No prior chest CT. FINDINGS: CT CHEST FINDINGS Cardiovascular: Heart size is normal. There is no significant pericardial fluid, thickening or pericardial calcification. There is aortic atherosclerosis, as well as atherosclerosis of the great vessels of the mediastinum and the coronary arteries, including calcified atherosclerotic plaque in the left main, left anterior descending, left circumflex and right coronary arteries. Mild calcifications of the mitral  annulus. Moderate calcifications of the aortic valve. Mediastinum/Nodes: No pathologically enlarged mediastinal or hilar lymph nodes. Please note that accurate exclusion of hilar adenopathy is limited on noncontrast CT scans. Esophagus is unremarkable in appearance. No axillary lymphadenopathy. Lungs/Pleura: Trace bilateral pleural effusions lying dependently with minimal passive subsegmental atelectasis in the lower lobes of the lungs bilaterally. There is some very mild patchy areas of ground-glass attenuation in the right upper lobe, nonspecific, but potentially of infectious or inflammatory etiology. No confluent consolidative airspace disease. No definite suspicious appearing pulmonary nodules or masses are noted. Musculoskeletal: Multiple old healed right-sided rib fractures are incidentally noted. There are no aggressive appearing lytic or blastic lesions noted in the visualized portions of the skeleton. CT ABDOMEN PELVIS FINDINGS Hepatobiliary: Liver has a shrunken appearance and nodular contour, indicative of advanced cirrhosis. No discrete cystic or solid hepatic lesions are confidently identified on today's noncontrast CT examination. Multiple tiny calcified gallstones lie dependently in the gallbladder. Gallbladder is moderately distended. Gallbladder wall does not appear thickened or edematous. No definite pericholecystic fluid. Pancreas: No definite pancreatic mass or peripancreatic fluid collections or inflammatory changes are noted on today's noncontrast CT examination. Spleen: Spleen appears mildly enlarged measuring 11.5 x 7.7 x 12.1 cm (estimated splenic volume of 536 mL) . Adrenals/Urinary Tract: Mild bilateral renal atrophy. In the lower pole of the left kidney there is a partially exophytic low-attenuation 1.7 cm lesion (axial image 78 of series 3) which is incompletely characterized on today's noncontrast CT examination, but similar to prior studies and statistically likely a small cysts (no  imaging follow-up is recommended). Unenhanced appearance of the right kidney and bilateral adrenal glands is otherwise unremarkable. No hydroureteronephrosis. Urinary bladder is largely obscured by beam hardening artifact from the patient's bilateral hip arthroplasties. Stomach/Bowel: Unenhanced appearance of the stomach is normal. There is no pathologic dilatation of small bowel or colon. A few scattered colonic diverticuli are noted, without definite focal surrounding inflammatory changes to indicate an acute diverticulitis at this time. Normal appendix. Vascular/Lymphatic: Atherosclerotic calcifications throughout the abdominal aorta and pelvic vasculature. Portal vein appears mildly dilated measuring 17 mm in the porta hepatis. No lymphadenopathy noted in the abdomen or pelvis. Reproductive: Prostate gland and seminal vesicles are obscured by beam hardening artifact. Other: No significant volume of ascites.  No pneumoperitoneum. Musculoskeletal: There are no aggressive appearing lytic or blastic lesions noted in the visualized portions of the skeleton. 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 IMPRESSION: 1. No definite findings in the chest,  abdomen or pelvis to account for the patient's unexplained weight loss. There are very subtle areas of mild ground-glass attenuation in the right upper lobe which are nonspecific, but potentially of infectious or inflammatory etiology. 2. Trace bilateral pleural effusions lying dependently. 3. Cholelithiasis without evidence of acute cholecystitis. 4. 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 5. Colonic diverticulosis without evidence of acute diverticulitis at this time. 6. Aortic atherosclerosis, in addition to left main and three-vessel coronary artery disease. 7. There are calcifications of the aortic valve and mitral annulus. Echocardiographic  correlation for evaluation of potential valvular dysfunction may be warranted if clinically indicated. 8. Additional incidental findings, as above. Electronically Signed   By: Vinnie Langton M.D.   On: 02/21/2022 06:26   DG Chest 2 View  Result Date: 02/20/2022 CLINICAL DATA:  Fall. EXAM: CHEST - 2 VIEW COMPARISON:  Chest x-ray 01/25/2022 FINDINGS: The heart size and mediastinal contours are within normal limits. There is minimal atelectasis in the lung bases. Lung volumes are low. Visualized skeletal structures are unremarkable. IMPRESSION: No active cardiopulmonary disease. Electronically Signed   By: Ronney Asters M.D.   On: 02/20/2022 23:07   DG Pelvis Portable  Result Date: 02/20/2022 CLINICAL DATA:  Fall. EXAM: PORTABLE PELVIS 1-2 VIEWS COMPARISON:  06/05/2021. FINDINGS: There is no evidence of pelvic fracture or diastasis. Bilateral hip arthroplasty changes are noted. There is no evidence of hardware loosening. Degenerative changes are noted in the lower lumbar spine. IMPRESSION: No acute fracture or dislocation. Electronically Signed   By: Brett Fairy M.D.   On: 02/20/2022 23:05   CT Head Wo Contrast  Result Date: 02/20/2022 CLINICAL DATA:  Fall EXAM: CT HEAD WITHOUT CONTRAST CT CERVICAL SPINE WITHOUT CONTRAST TECHNIQUE: Multidetector CT imaging of the head and cervical spine was performed following the standard protocol without intravenous contrast. Multiplanar CT image reconstructions of the cervical spine were also generated. RADIATION DOSE REDUCTION: This exam was performed according to the departmental dose-optimization program which includes automated exposure control, adjustment of the mA and/or kV according to patient size and/or use of iterative reconstruction technique. COMPARISON:  01/31/2022 FINDINGS: Motion degraded images. CT HEAD FINDINGS Brain: No evidence of acute infarction, hemorrhage, hydrocephalus, extra-axial collection or mass lesion/mass effect. Global cortical and  central atrophy. Subcortical white matter and periventricular small vessel ischemic changes. Vascular: Intracranial atherosclerosis. Skull: Normal. Negative for fracture or focal lesion. Sinuses/Orbits: The visualized paranasal sinuses are essentially clear. The mastoid air cells are unopacified. Other: None. CT CERVICAL SPINE FINDINGS Alignment: Straightening the cervical spine, likely positional. Skull base and vertebrae: Stable fracture involving the posterior aspect of the right superior articular facet of C1 (series 3/image 13). No new/acute fracture. Soft tissues and spinal canal: No prevertebral fluid or swelling. No visible canal hematoma. Disc levels: Moderate degenerative changes of the lower cervical spine. Spinal canal is patent. Upper chest: Visualized lung apices are essentially clear. Other: Visualized thyroid is grossly unremarkable. IMPRESSION: No acute intracranial abnormality. Atrophy with small vessel ischemic changes. Stable fracture involving the posterior aspect of the right superior articular facet of C1. No new/acute traumatic injury to the cervical spine. Electronically Signed   By: Julian Hy M.D.   On: 02/20/2022 22:48   CT Cervical Spine Wo Contrast  Result Date: 02/20/2022 CLINICAL DATA:  Fall EXAM: CT HEAD WITHOUT CONTRAST CT CERVICAL SPINE WITHOUT CONTRAST TECHNIQUE: Multidetector CT imaging of the head and cervical spine was performed following the standard protocol without intravenous contrast.  Multiplanar CT image reconstructions of the cervical spine were also generated. RADIATION DOSE REDUCTION: This exam was performed according to the departmental dose-optimization program which includes automated exposure control, adjustment of the mA and/or kV according to patient size and/or use of iterative reconstruction technique. COMPARISON:  01/31/2022 FINDINGS: Motion degraded images. CT HEAD FINDINGS Brain: No evidence of acute infarction, hemorrhage, hydrocephalus,  extra-axial collection or mass lesion/mass effect. Global cortical and central atrophy. Subcortical white matter and periventricular small vessel ischemic changes. Vascular: Intracranial atherosclerosis. Skull: Normal. Negative for fracture or focal lesion. Sinuses/Orbits: The visualized paranasal sinuses are essentially clear. The mastoid air cells are unopacified. Other: None. CT CERVICAL SPINE FINDINGS Alignment: Straightening the cervical spine, likely positional. Skull base and vertebrae: Stable fracture involving the posterior aspect of the right superior articular facet of C1 (series 3/image 13). No new/acute fracture. Soft tissues and spinal canal: No prevertebral fluid or swelling. No visible canal hematoma. Disc levels: Moderate degenerative changes of the lower cervical spine. Spinal canal is patent. Upper chest: Visualized lung apices are essentially clear. Other: Visualized thyroid is grossly unremarkable. IMPRESSION: No acute intracranial abnormality. Atrophy with small vessel ischemic changes. Stable fracture involving the posterior aspect of the right superior articular facet of C1. No new/acute traumatic injury to the cervical spine. Electronically Signed   By: Julian Hy M.D.   On: 02/20/2022 22:48    Review of Systems  Constitutional:  Positive for fatigue. Negative for chills, diaphoresis and fever.  HENT:  Negative for ear discharge, ear pain, hearing loss and tinnitus.   Eyes:  Negative for photophobia and pain.  Respiratory:  Negative for cough and shortness of breath.   Cardiovascular:  Negative for chest pain.  Gastrointestinal:  Positive for diarrhea. Negative for abdominal pain, nausea and vomiting.  Genitourinary:  Negative for dysuria, flank pain, frequency and urgency.  Musculoskeletal:  Positive for back pain. Negative for arthralgias, myalgias and neck pain.  Neurological:  Negative for dizziness and headaches.  Hematological:  Does not bruise/bleed easily.   Psychiatric/Behavioral:  The patient is not nervous/anxious.    Blood pressure (!) 129/57, pulse 84, temperature 98.2 F (36.8 C), temperature source Oral, resp. rate (!) 22, height 6' (1.829 m), weight 77.6 kg, SpO2 98 %. Physical Exam Constitutional:      General: He is not in acute distress.    Appearance: He is well-developed. He is not diaphoretic.  HENT:     Head: Normocephalic and atraumatic.  Eyes:     General: No scleral icterus.       Right eye: No discharge.        Left eye: No discharge.     Conjunctiva/sclera: Conjunctivae normal.  Neck:     Comments: C-collar Cardiovascular:     Rate and Rhythm: Normal rate and regular rhythm.  Pulmonary:     Effort: Pulmonary effort is normal. No respiratory distress.  Feet:     Comments: Left foot: Surgically absent great toe. 1+ DP, 2+ PT. SPN/DPN/TN intact and unchanged. No erythema, pain with motion, ulceration, or odor noted. No significant digital or pedal edema. Skin:    General: Skin is warm and dry.  Neurological:     Mental Status: He is alert.  Psychiatric:        Mood and Affect: Mood normal.        Behavior: Behavior normal.     Assessment/Plan: Left foot osteo -- Will have Dr. Sharol Given evaluate later today or in AM. Foot does not appear to  be cause of sepsis.    Lisette Abu, PA-C Orthopedic Surgery [phone] 02/22/2022, 3:25 PM

## 2022-02-22 NOTE — Assessment & Plan Note (Signed)
Order gastric panel c.dif

## 2022-02-22 NOTE — Assessment & Plan Note (Addendum)
Unclear etiology not currently endorsing chest pain we will continue to cycle obtain echogram cardiology consult in a.m. or sooner if decompensates If continues to trend up would anticoagulate

## 2022-02-22 NOTE — Assessment & Plan Note (Signed)
Allow permissive htn ?

## 2022-02-22 NOTE — Assessment & Plan Note (Signed)
Continue amiodarone 200 mg po qday

## 2022-02-22 NOTE — ED Notes (Signed)
Progressive

## 2022-02-22 NOTE — Procedures (Signed)
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  BFR 300  Via AVF BP is  138/46.   Patient appears to be tolerating treatment well  Louis Meckel 02/22/2022

## 2022-02-22 NOTE — Progress Notes (Signed)
CSW spoke with Laurene at [facility] to determine patient's current level of care at the facility. Laurene states the patient moved into the facility on 02/04/2022 on the assisted living side. Laurene states that whenever the patient is medically cleared for discharge to return call to the facility at [phone] and ask to speak with the executive director, Mickel Baas.  Madilyn Fireman, MSW, LCSW Transitions of Care  Clinical Social Worker II [phone]

## 2022-02-22 NOTE — Assessment & Plan Note (Signed)
-  SIRS criteria met with   tachycardia   , fever RR >20 Today's Vitals   02/21/22 1958 02/21/22 1959 02/21/22 2301 02/22/22 0038  BP:   (!) 129/59 (!) 117/50  Pulse:   90 92  Resp:   (!) 22 16  Temp: 98.1 F (36.7 C)  98.6 F (37 C)   TempSrc: Oral     SpO2:   95% 93%  Weight:  77.6 kg    Height:  6' (1.829 m)    PainSc:  0-No pain 0-No pain 0-No pain   Body mass index is 23.19 kg/m.   The recent clinical data is shown below. Vitals:   02/21/22 1958 02/21/22 1959 02/21/22 2301 02/22/22 0038  BP:   (!) 129/59 (!) 117/50  Pulse:   90 92  Resp:   (!) 22 16  Temp: 98.1 F (36.7 C)  98.6 F (37 C)   TempSrc: Oral     SpO2:   95% 93%  Weight:  77.6 kg    Height:  6' (1.829 m)      -Most likely source being: pulmonary, intra-abdominal,     elevated lactic acid >2     Component Value Date/Time   LATICACIDVEN 2.0 (HH) 02/21/2022 2376    acute metabolic encephalopathy      Component Value Date/Time   PLT 83 (L) 02/21/2022 2009   PLT 163 06/03/2020 1217     - Obtain serial lactic acid and procalcitonin level.  - Initiated IV antibiotics in ER: Antibiotics Given (last 72 hours)    Date/Time Action Medication Dose Rate   02/21/22 2314 New Bag/Given   ceFEPIme (MAXIPIME) 2 g in sodium chloride 0.9 % 100 mL IVPB 2 g 200 mL/hr   02/22/22 0036 New Bag/Given   vancomycin (VANCOREADY) IVPB 1500 mg/300 mL 1,500 mg 150 mL/hr      Will continue  on : cafepime/vanc   - await results of blood and urine culture  - Rehydrate  Intravenous fluids were administered, {     12:54 AM

## 2022-02-23 ENCOUNTER — Inpatient Hospital Stay (HOSPITAL_COMMUNITY): Payer: Medicare Other

## 2022-02-23 DIAGNOSIS — R6520 Severe sepsis without septic shock: Secondary | ICD-10-CM | POA: Diagnosis not present

## 2022-02-23 DIAGNOSIS — L03116 Cellulitis of left lower limb: Secondary | ICD-10-CM | POA: Diagnosis not present

## 2022-02-23 DIAGNOSIS — A419 Sepsis, unspecified organism: Secondary | ICD-10-CM | POA: Diagnosis not present

## 2022-02-23 DIAGNOSIS — K7030 Alcoholic cirrhosis of liver without ascites: Secondary | ICD-10-CM | POA: Diagnosis not present

## 2022-02-23 DIAGNOSIS — R7989 Other specified abnormal findings of blood chemistry: Secondary | ICD-10-CM | POA: Diagnosis not present

## 2022-02-23 DIAGNOSIS — I2510 Atherosclerotic heart disease of native coronary artery without angina pectoris: Secondary | ICD-10-CM | POA: Diagnosis not present

## 2022-02-23 LAB — HEPATITIS B SURFACE ANTIBODY, QUANTITATIVE: Hep B S AB Quant (Post): 6.7 m[IU]/mL — ABNORMAL LOW (ref >9.9)

## 2022-02-23 LAB — LEGIONELLA PNEUMOPHILA SEROGP 1 UR AG: L. pneumophila Serogp 1 Ur Ag: NEGATIVE

## 2022-02-23 LAB — BASIC METABOLIC PANEL
Anion gap: 14 (ref 5–15)
BUN: 22 mg/dL (ref 8–23)
CO2: 25 mmol/L (ref 22–32)
Calcium: 8.1 mg/dL — ABNORMAL LOW (ref 8.9–10.3)
Chloride: 99 mmol/L (ref 98–111)
Creatinine, Ser: 2.17 mg/dL — ABNORMAL HIGH (ref 0.61–1.24)
GFR, Estimated: 30 mL/min — ABNORMAL LOW (ref >60)
Glucose, Bld: 99 mg/dL (ref 70–99)
Potassium: 3.6 mmol/L (ref 3.5–5.1)
Sodium: 138 mmol/L (ref 135–145)

## 2022-02-23 LAB — CBC
HCT: 22.6 % — ABNORMAL LOW (ref 39.0–52.0)
Hemoglobin: 7.6 g/dL — ABNORMAL LOW (ref 13.0–17.0)
MCH: 35 pg — ABNORMAL HIGH (ref 26.0–34.0)
MCHC: 33.6 g/dL (ref 30.0–36.0)
MCV: 104.1 fL — ABNORMAL HIGH (ref 80.0–100.0)
Platelets: 73 10*3/uL — ABNORMAL LOW (ref 150–400)
RBC: 2.17 MIL/uL — ABNORMAL LOW (ref 4.22–5.81)
RDW: 14.8 % (ref 11.5–15.5)
WBC: 7.9 10*3/uL (ref 4.0–10.5)
nRBC: 0 % (ref 0.0–0.2)

## 2022-02-23 LAB — URINE CULTURE: Culture: <10000 — AB

## 2022-02-23 LAB — CULTURE, BLOOD (ROUTINE X 2)
Special Requests: ADEQUATE
Special Requests: ADEQUATE

## 2022-02-23 LAB — PROCALCITONIN: Procalcitonin: 10.32 ng/mL

## 2022-02-23 LAB — CBG MONITORING, ED: Glucose-Capillary: 136 mg/dL — ABNORMAL HIGH (ref 70–99)

## 2022-02-23 MED ORDER — CEFAZOLIN SODIUM-DEXTROSE 2-4 GM/100ML-% IV SOLN
2.0000 g | INTRAVENOUS | Status: DC
  Administered 2022-02-24: 2 g via INTRAVENOUS
  Filled 2022-02-23: qty 100

## 2022-02-23 NOTE — ED Notes (Signed)
Pt awake at this time. Calm, cooperative, following directions well. Pt assisted in drinking cranberry juice and PO medication was administered without incident. Pt not hungry at this time. CBG checked. Mitts in place from previous RN. Bilateral hands assessed and appear warm, dry, no skin breakdown noted. [place] J in place correctly. Pt reported pain and PRN medication was administered.

## 2022-02-23 NOTE — Consult Note (Signed)
ORTHOPAEDIC CONSULTATION  REQUESTING PHYSICIAN: Charlynne Cousins, MD  Chief Complaint: Altered mental status.  HPI: Darryl Diaz is a 81 y.o. male who presents with altered mental status from a skilled nursing facility with swelling and redness of the left foot.  Past Medical History:  Diagnosis Date   Alcohol abuse 64/40/3474   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[place]) 03/17/2020   Alcoholism ([place])    in remission following wife's death   Anemia    Anxiety    Ascending aorta dilation (HCC)    Atrial fibrillation (HCC)    Cardiac arrest ([place]) 04/20/2019   12 min CPR with epi   Chronic diastolic CHF (congestive heart failure) ([place])    Dysrhythmia    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[place])    Mon Wed Fri   Fatty liver    Fracture of hip ([place]) 03/30/2016   Gastritis    GERD (gastroesophageal reflux disease)    H/O seasonal allergies    Heme positive stool 05/14/2020   History of blood transfusion    History of GI bleed    Hx of adenomatous colonic polyps 08/10/2017   Hyperlipidemia    Hypertension    Insomnia    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   Mallory-Weiss tear    OA (osteoarthritis)    hands   OSA (obstructive sleep apnea)    No longer uses CPAP   Persistent atrial fibrillation with rapid ventricular response ([place]) 02/05/2020   Posterior dislocation of hip, closed ([place]) 04/30/2016   Recurrent syncope    Restless legs syndrome (RLS)    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([place])    Past Surgical History:  Procedure Laterality Date   AMPUTATION Left 05/19/2021   Procedure: LEFT GREAT TOE AMPUTATION;  Surgeon: Newt Minion, MD;  Location: [facility];  Service: Orthopedics;  Laterality: Left;   ANKLE SURGERY Left 2014   had rods put in    AV FISTULA PLACEMENT Left 04/21/2021   Procedure: LEFT ARM ARTERIOVENOUS (AV) FISTULA CREATION - Left brachiocephalic fistula;  Surgeon: Marty Heck, MD;  Location: [facility];  Service: Vascular;   Laterality: Left;   BIOPSY  02/28/2019   Procedure: BIOPSY;  Surgeon: Milus Banister, MD;  Location: [facility];  Service: Endoscopy;;   CARDIOVERSION  02/06/2020   CARDIOVERSION N/A 02/06/2020   Procedure: CARDIOVERSION;  Surgeon: Werner Lean, MD;  Location: [facility];  Service: Cardiovascular;  Laterality: N/A;   CARDIOVERSION N/A 02/24/2020   Procedure: CARDIOVERSION;  Surgeon: Werner Lean, MD;  Location: [facility];  Service: Cardiovascular;  Laterality: N/A;   COLONOSCOPY     [place]   COLONOSCOPY WITH PROPOFOL N/A 01/08/2021   Procedure: COLONOSCOPY WITH PROPOFOL;  Surgeon: Milus Banister, MD;  Location: [facility];  Service: Endoscopy;  Laterality: N/A;   ESOPHAGOGASTRODUODENOSCOPY (EGD) WITH PROPOFOL N/A 02/28/2019   Procedure: ESOPHAGOGASTRODUODENOSCOPY (EGD) WITH PROPOFOL;  Surgeon: Milus Banister, MD;  Location: [facility] ENDOSCOPY;  Service: Endoscopy;  Laterality: N/A;   FRACTURE SURGERY     HIP ARTHROPLASTY Left 04/01/2016   Procedure: ARTHROPLASTY BIPOLAR HIP (HEMIARTHROPLASTY);  Surgeon: Paralee Cancel, MD;  Location: [facility];  Service: Orthopedics;  Laterality: Left;   HIP CLOSED REDUCTION Left 04/30/2016   Procedure: CLOSED REDUCTION HIP;  Surgeon: Wylene Simmer, MD;  Location: [place];  Service: Orthopedics;  Laterality: Left;   HOT HEMOSTASIS N/A 01/08/2021   Procedure: HOT HEMOSTASIS (ARGON PLASMA COAGULATION/BICAP);  Surgeon: Milus Banister, MD;  Location: [facility];  Service: Endoscopy;  Laterality: N/A;   INSERTION OF DIALYSIS CATHETER N/A 01/25/2022   Procedure: INSERTION OF TUNNELED DIALYSIS CATHETER;  Surgeon: Angelia Mould, MD;  Location: [place];  Service: Vascular;  Laterality: N/A;   IR RADIOLOGIST EVAL & MGMT  12/31/2020   JOINT REPLACEMENT     TONSILLECTOMY     TOTAL HIP ARTHROPLASTY Right 10/15/2019   Procedure: TOTAL HIP ARTHROPLASTY ANTERIOR APPROACH;  Surgeon: Paralee Cancel, MD;  Location: [facility];  Service:  Orthopedics;  Laterality: Right;  70 mins   VASCULAR SURGERY     Social History   Socioeconomic History   Marital status: Widowed    Spouse name: Not on file   Number of children: 2   Years of education: college   Highest education level: Not on file  Occupational History   Occupation: retired  Tobacco Use   Smoking status: Former    Packs/day: 1.00    Years: 20.00    Total pack years: 20.00    Types: Cigarettes   Smokeless tobacco: Never   Tobacco comments:       quit 20 years  smoked on and off for 20 years  Vaping Use   Vaping Use: Never used  Substance and Sexual Activity   Alcohol use: Not Currently    Alcohol/week: 3.0 standard drinks of alcohol    Types: 3 Glasses of wine per week    Comment: h/o heavy use   Drug use: Never   Sexual activity: Not Currently  Other Topics Concern   Not on file  Social History Narrative   ** Merged History Encounter **       The patient is divorced w/ 1 son 1 daughter He is a retired Regulatory affairs officer, he lived in [place] but [employer] including windows on the world in the [place] room in [place] He moved to [place] to be near his sister who liv   es in [place] alcoholic member of AA 2 caffeinated beverages daily prior smoker no drugs   Social Determinants of Radio broadcast assistant Strain: Low Risk  (10/23/2020)   Overall Financial Resource Strain (CARDIA)    Difficulty of Paying Living Expenses: Not hard at all  Food Insecurity: No Food Insecurity (10/23/2020)   Hunger Vital Sign    Worried About Running Out of Food in the Last Year: Never true    [place] in the Last Year: Never true  Transportation Needs: No Transportation Needs (10/23/2020)   PRAPARE - Hydrologist (Medical): No    Lack of Transportation (Non-Medical): No  Physical Activity: Not on file  Stress: No Stress Concern Present (10/23/2020)   [place]    Feeling of Stress : Not at all  Social Connections: Moderately Integrated (10/23/2020)   Social Connection and Isolation Panel [NHANES]    Frequency of Communication with Friends and Family: More than three times a week    Frequency of Social Gatherings with Friends and Family: More than three times a week    Attends Religious Services: 1 to 4 times per year    Active Member of [employer] or Organizations: No    Attends Archivist Meetings: 1 to 4 times per year    Marital Status: Widowed   Family History  Problem Relation Age of Onset   Heart disease Mother 37   Hypertension Mother  Arthritis Mother    Heart failure Father 22   Stroke Maternal Aunt    Heart failure Sister    Colon cancer Neg Hx    Esophageal cancer Neg Hx    Stomach cancer Neg Hx    Rectal cancer Neg Hx    - negative except otherwise stated in the family history section Allergies  Allergen Reactions   Penicillin G Sodium Rash and Other (See Comments)     (tolerates Keflex)  NOT DOCUMENTED ON THE MAR   Penicillins Rash    NOT DOCUMENTED ON THE MAR   Prior to Admission medications   Medication Sig Start Date End Date Taking? Authorizing Provider  amiodarone (PACERONE) 200 MG tablet TAKE 1 TABLET BY MOUTH ONCE DAILY Patient taking differently: Take 200 mg by mouth every morning. 05/17/21  Yes Weaver, Scott T, PA-C  atorvastatin (LIPITOR) 20 MG tablet Take 1 tablet (20 mg total) by mouth daily. 03/10/20  Yes Angiulli, Lavon Paganini, PA-C  carvedilol (COREG) 6.25 MG tablet Take 1 tablet (6.25 mg total) by mouth 2 (two) times daily. Patient taking differently: Take 12.5 mg by mouth daily. 02/11/21  Yes Sherren Mocha, MD  cetirizine (ZYRTEC) 10 MG tablet Take 10 mg by mouth daily.   Yes [provider]  escitalopram (LEXAPRO) 10 MG tablet Take 1 tablet (10 mg total) by mouth daily. Patient taking differently: Take 10 mg by mouth every morning. 03/10/20  Yes  Angiulli, Lavon Paganini, PA-C  ferrous sulfate 324 MG TBEC Take 324 mg by mouth every morning.   Yes [provider]  FOLIC ACID PO Take 1 tablet by mouth every morning.   Yes [provider]  Multiple Vitamin (MULTIVITAMIN PO) Take 1 tablet by mouth every other day.   Yes [provider]  nitroGLYCERIN (NITROSTAT) 0.4 MG SL tablet Place 1 tablet (0.4 mg total) under the tongue every 5 (five) minutes x 3 doses as needed for chest pain. 07/28/20  Yes Baldwin Jamaica, PA-C  pantoprazole (PROTONIX) 40 MG tablet Take 1 tablet (40 mg total) by mouth daily. Patient taking differently: Take 40 mg by mouth every morning. 03/10/20  Yes Angiulli, Lavon Paganini, PA-C  saccharomyces boulardii (FLORASTOR) 250 MG capsule Take 1 capsule (250 mg total) by mouth 2 (two) times daily. 01/27/22  Yes Regalado, Belkys A, MD  tamsulosin (FLOMAX) 0.4 MG CAPS capsule Take 0.4 mg by mouth at bedtime.   Yes [provider]  thiamine (VITAMIN B-1) 100 MG tablet Take 1 tablet (100 mg total) by mouth daily. 01/28/22  Yes Regalado, Belkys A, MD  torsemide (DEMADEX) 20 MG tablet Take 60 mg by mouth daily.   Yes [provider]  traZODone (DESYREL) 50 MG tablet Take 1 tablet (50 mg total) by mouth at bedtime. 11/26/21  Yes Alma Friendly, MD  doxycycline (VIBRAMYCIN) 100 MG capsule Take 1 capsule (100 mg total) by mouth 2 (two) times daily. One po bid x 7 days Patient not taking: Reported on 02/22/2022 02/21/22   Merrily Pew, MD   MR LUMBAR SPINE WO CONTRAST  Result Date: 02/23/2022 CLINICAL DATA:  Initial evaluation for staph bacteremia. EXAM: MRI LUMBAR SPINE WITHOUT CONTRAST TECHNIQUE: Multiplanar, multisequence MR imaging of the lumbar spine was performed. No intravenous contrast was administered. COMPARISON:  Prior CT from 02/21/2022. FINDINGS: Segmentation: Examination is severely degraded by motion artifact, markedly limiting assessment. Standard segmentation. Alignment: Physiologic  with preservation of the normal lumbar lordosis. No significant listhesis. Vertebrae: Vertebral body height grossly maintained  with no acute or chronic fracture. Bone marrow signal intensity diffusely heterogeneous. No worrisome osseous lesions. Fluid signal intensity with adjacent mild reactive endplate changes noted at the T12-L1, L1-2, L2-3, L3-4, and L4-5 interspaces. While these findings are in large part suspected to be degenerative, possible infection with osteomyelitis discitis is difficult to exclude on this severely motion degraded exam. Question of mild paraspinous edema within the adjacent psoas musculature. Conus medullaris and cauda equina: Conus extends to the L1 level. Conus and cauda equina are grossly within normal limits. No visible epidural collections, although evaluation is fairly limited. Paraspinal and other soft tissues: Question mild paraspinous edema within the psoas musculature bilaterally. No visible collections. Disc levels: T12-L1: Disc bulge with endplate spurring. Left greater than right facet hypertrophy. No significant spinal stenosis. Foramina appear grossly patent. L1-2: Intervertebral disc space narrowing with diffuse disc bulge and reactive endplate change. Moderate facet hypertrophy. At least moderate spinal stenosis. Foramina appear grossly patent. L2-3: Degenerative intervertebral disc space narrowing with diffuse disc bulge and reactive endplate change. Moderate facet hypertrophy. Resultant severe spinal stenosis. Moderate right L2 foraminal narrowing. Left neural foramen remains patent. L3-4: Degenerative intervertebral disc space narrowing with diffuse disc bulge and endplate spurring. Moderate facet arthrosis. Resultant severe spinal stenosis. Foramina appear grossly patent. L4-5: Degenerative intervertebral disc space narrowing with diffuse disc bulge and reactive endplate spurring. Moderate facet hypertrophy. Moderate canal with severe left and moderate right lateral  recess stenosis. Mild to moderate left L4 foraminal narrowing. Right neural foramen remains patent. L5-S1: Advanced degenerative intervertebral disc space narrowing with diffuse disc bulge and reactive endplate spurring. Moderate facet hypertrophy. Moderate left greater than right lateral recess stenosis. Central canal remains patent. No significant foraminal stenosis. IMPRESSION: 1. Severely motion degraded exam, markedly limiting assessment. 2. Fluid signal intensity with adjacent mild reactive endplate changes at the T12-L1, L1-2, L2-3, L3-4, and L4-5 interspaces. While these findings could be degenerative in nature, possible infection with osteomyelitis discitis is difficult to exclude on this severely motion degraded exam. Correlation with symptomatology and laboratory values recommended. 3. 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 Electronically Signed   By: Jeannine Boga M.D.   On: 02/23/2022 01:29   MR THORACIC SPINE WO CONTRAST  Result Date: 02/23/2022 CLINICAL DATA:  Initial evaluation for staph bacteremia. EXAM: MRI THORACIC SPINE WITHOUT CONTRAST TECHNIQUE: Multiplanar, multisequence MR imaging of the thoracic spine was performed. No intravenous contrast was administered. COMPARISON:  Prior CT from 02/21/2022. FINDINGS: Alignment: Examination is severely degraded by motion artifact, markedly limiting assessment. Mild exaggeration of the normal thoracic kyphosis.  No listhesis. Vertebrae: Vertebral body height grossly maintained without acute or chronic fracture. Bone marrow signal intensity diffusely heterogeneous. 1 cm T1/T2 hyperintense lesion within the T1 vertebral body, likely a hemangioma. No definite worrisome osseous lesions. Fluid signal intensity with mild disc space widening and reactive endplate edema noted about the partially visualized C6-7 interspace (series 6, image 10).  Suggestion of mild paraspinous edema within this region. While these findings could be degenerative, possible osteomyelitis discitis could also have this appearance. Otherwise, no other convincing evidence for acute infection elsewhere within the thoracic spine on this motion degraded exam. Cord: Grossly normal signal and morphology. No visible epidural collections. Paraspinal and other soft tissues: Probable mild paraspinous edema adjacent to the C6-7 interspace. No other visible paraspinous soft tissue abnormality. Layering bilateral pleural effusions with associated atelectasis and/or consolidation. Disc levels: Multilevel disc desiccation with  mild noncompressive disc bulging and reactive endplate spurring seen throughout the thoracic spine. No significant or high-grade spinal stenosis. Foramina appear grossly patent. IMPRESSION: 1. Severely motion degraded exam. 2. Fluid signal intensity with reactive endplate in paraspinous edema about the partially visualized C6-7 interspace. While these findings could be degenerative in nature, possible osteomyelitis discitis could also have this appearance. Correlation with symptomatology and laboratory values recommended. Further evaluation with dedicated MRI of the cervical spine could be performed for further evaluation as the patient is able to tolerate. 3. No other convincing evidence for acute infection within the thoracic spine. 4. Layering bilateral pleural effusions with associated atelectasis and/or consolidation. Electronically Signed   By: Jeannine Boga M.D.   On: 02/23/2022 01:20   ECHOCARDIOGRAM COMPLETE  Result Date: 02/22/2022    ECHOCARDIOGRAM REPORT   Patient Name:   JAYLENE SCHROM Date of Exam: 02/22/2022 Medical Rec #:  [id number]        Height:       72.0 in Accession #:    [id number]       Weight:       171.0 lb Date of Birth:  07/26/1940        BSA:          1.993 m Patient Age:    81 years         BP:           110/43 mmHg Patient  Gender: M                HR:           124 bpm. Exam Location:  Inpatient Procedure: 2D Echo, Cardiac Doppler and Color Doppler Indications:    elevated trops  History:        Patient has prior history of Echocardiogram examinations, most                 recent 01/17/2021. CAD, Arrythmias:Bradycardia; Risk                 Factors:Hypertension and Dyslipidemia.  Sonographer:    Melissa Morford RDCS (AE, PE) Referring Phys: [place]  1. Left ventricular ejection fraction, by estimation, is 60 to 65%. The left ventricle has normal function. The left ventricle has no regional wall motion abnormalities. Left ventricular diastolic parameters are consistent with Grade I diastolic dysfunction (impaired relaxation). Elevated left ventricular end-diastolic pressure. The E/e' is 66.  2. Right ventricular systolic function is normal. The right ventricular size is normal. There is normal pulmonary artery systolic pressure. The estimated right ventricular systolic pressure is 76.1 mmHg.  3. Left atrial size was moderately dilated.  4. The mitral valve is abnormal. Mild mitral valve regurgitation. Moderate mitral annular calcification.  5. The aortic valve is tricuspid. Aortic valve regurgitation is not visualized. Mild aortic valve stenosis. Aortic valve area, by VTI measures 2.45 cm. Aortic valve mean gradient measures 12.5 mmHg. Aortic valve Vmax measures 2.18 m/s.  6. Aortic dilatation noted. There is borderline dilatation of the ascending aorta, measuring 39 mm.  7. The inferior vena cava is dilated in size with <50% respiratory variability, suggesting right atrial pressure of 15 mmHg.  8. Cannot exclude a small PFO. Comparison(s): Changes from prior study are noted. 01/17/2021: LVEF 60-65%, mild AS - mean gradient 12 mmHg. FINDINGS  Left Ventricle: Left ventricular ejection fraction, by estimation, is 60 to 65%. The left ventricle has normal function. The left ventricle has no regional wall motion  abnormalities.  The left ventricular internal cavity size was normal in size. There is  no left ventricular hypertrophy. Left ventricular diastolic parameters are consistent with Grade I diastolic dysfunction (impaired relaxation). Elevated left ventricular end-diastolic pressure. The E/e' is 15. Right Ventricle: The right ventricular size is normal. No increase in right ventricular wall thickness. Right ventricular systolic function is normal. There is normal pulmonary artery systolic pressure. The tricuspid regurgitant velocity is 1.63 m/s, and  with an assumed right atrial pressure of 15 mmHg, the estimated right ventricular systolic pressure is 35.7 mmHg. Left Atrium: Left atrial size was moderately dilated. Right Atrium: Right atrial size was normal in size. Pericardium: There is no evidence of pericardial effusion. Mitral Valve: The mitral valve is abnormal. There is moderate calcification of the posterior mitral valve leaflet(s). Moderate mitral annular calcification. Mild mitral valve regurgitation. Tricuspid Valve: The tricuspid valve is grossly normal. Tricuspid valve regurgitation is trivial. Aortic Valve: The aortic valve is tricuspid. Aortic valve regurgitation is not visualized. Mild aortic stenosis is present. Aortic valve mean gradient measures 12.5 mmHg. Aortic valve peak gradient measures 19.0 mmHg. Aortic valve area, by VTI measures 2.45 cm. Pulmonic Valve: The pulmonic valve was grossly normal. Pulmonic valve regurgitation is trivial. Aorta: Aortic dilatation noted. There is borderline dilatation of the ascending aorta, measuring 39 mm. Venous: The inferior vena cava is dilated in size with less than 50% respiratory variability, suggesting right atrial pressure of 15 mmHg. IAS/Shunts: Cannot exclude a small PFO.  LEFT VENTRICLE PLAX 2D LVIDd:         5.33 cm      Diastology LVIDs:         4.13 cm      LV e' medial:    8.92 cm/s LV PW:         0.93 cm      LV E/e' medial:  13.9 LV IVS:        0.87  cm      LV e' lateral:   6.64 cm/s LVOT diam:     2.10 cm      LV E/e' lateral: 18.7 LV SV:         108 LV SV Index:   54 LVOT Area:     3.46 cm  LV Volumes (MOD) LV vol d, MOD A2C: 143.0 ml LV vol d, MOD A4C: 162.0 ml LV vol s, MOD A2C: 70.6 ml LV vol s, MOD A4C: 65.8 ml LV SV MOD A2C:     72.4 ml LV SV MOD A4C:     162.0 ml LV SV MOD BP:      88.1 ml RIGHT VENTRICLE RV S prime:     11.30 cm/s TAPSE (M-mode): 2.6 cm LEFT ATRIUM             Index        RIGHT ATRIUM           Index LA diam:        4.20 cm 2.11 cm/m   RA Area:     14.50 cm LA Vol (A2C):   77.0 ml 38.63 ml/m  RA Volume:   34.20 ml  17.16 ml/m LA Vol (A4C):   87.3 ml 43.80 ml/m LA Biplane Vol: 87.7 ml 44.00 ml/m  AORTIC VALVE AV Area (Vmax):    2.38 cm AV Area (Vmean):   2.31 cm AV Area (VTI):     2.45 cm AV Vmax:           218.00 cm/s AV  Vmean:          167.000 cm/s AV VTI:            0.440 m AV Peak Grad:      19.0 mmHg AV Mean Grad:      12.5 mmHg LVOT Vmax:         150.00 cm/s LVOT Vmean:        111.500 cm/s LVOT VTI:          0.310 m LVOT/AV VTI ratio: 0.71  AORTA Ao Root diam: 3.60 cm Ao Asc diam:  3.90 cm MITRAL VALVE                TRICUSPID VALVE MV Area (PHT): 4.80 cm     TR Peak grad:   10.6 mmHg MV Decel Time: 158 msec     TR Vmax:        163.00 cm/s MV E velocity: 124.00 cm/s MV A velocity: 121.00 cm/s  SHUNTS MV E/A ratio:  1.02         Systemic VTI:  0.31 m                             Systemic Diam: 2.10 cm Lyman Bishop MD Electronically signed by Lyman Bishop MD Signature Date/Time: 02/22/2022/11:01:26 AM    Final    DG Foot 2 Views Left  Result Date: 02/22/2022 CLINICAL DATA:  Cellulitis EXAM: LEFT FOOT - 2 VIEW COMPARISON:  Radiographs 11/02/2021; MRI 06/08/2021; radiographs 06/07/2021 FINDINGS: 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 Osseous destruction at the base of the second proximal phalanx in the head of the second metatarsal consistent with osteomyelitis. Question osteo myelitis of the medial  aspect of the fourth metatarsal head versus projection artifact. Remote posttraumatic deformity about the head of the third metatarsal. 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 Postsurgical changes about the ankle. 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 Question osteomyelitis of the medial aspect of the fourth metatarsal head. Electronically Signed   By: Placido Sou M.D.   On: 02/22/2022 02:04   DG Chest 1 View  Result Date: 02/21/2022 CLINICAL DATA:  Weakness EXAM: CHEST  1 VIEW COMPARISON:  02/20/2022 FINDINGS: Stable cardiomediastinal contours. Slightly low lung volumes with bibasilar atelectasis. Trace effusions. No pneumothorax. Advanced degenerative changes of the left glenohumeral joint. IMPRESSION: Bibasilar atelectasis and trace bilateral pleural effusions. Electronically Signed   By: Davina Poke D.O.   On: 02/21/2022 20:42   - pertinent xrays, CT, MRI studies were reviewed and independently interpreted  Positive ROS: All other systems have been reviewed and were otherwise negative with the exception of those mentioned in the HPI and as above.  Physical Exam: General: Not alert, not oriented, no acute distress Psychiatric: Patient is not competent for consent he is confused. Lymphatic: No axillary or cervical lymphadenopathy Cardiovascular: No pedal edema Respiratory: No cyanosis, no use of accessory musculature GI: No organomegaly, abdomen is soft and non-tender  Patient has a cervical collar and restraint mittens.  Images:  '@ENCIMAGES'$ @  Labs:  Lab Results  Component Value Date   HGBA1C 4.4 (L) 09/07/2020   HGBA1C 4.7 (L) 02/28/2019   ESRSEDRATE 25 (H) 06/07/2021   CRP 3.2 (H) 06/10/2021   CRP 4.1 (H) 06/07/2021   LABURIC 5.5 09/28/2021   REPTSTATUS PENDING 02/21/2022   GRAMSTAIN  01/20/2021    RARE WBC PRESENT,BOTH PMN AND MONONUCLEAR RARE GRAM POSITIVE COCCI IN  PAIRS Performed at [facility], [place] [address],  [place], [place] [address]    [place]ine Dose POSITIVE COCCI 02/21/2022   LABORGA ENTEROCOCCUS FAECALIS (A) 11/23/2021    Lab Results  Component Value Date   ALBUMIN 2.4 (L) 02/22/2022   ALBUMIN 2.6 (L) 02/21/2022   ALBUMIN 3.0 (L) 02/20/2022   PREALBUMIN 7 (L) 02/22/2022   LABURIC 5.5 09/28/2021        Latest Ref Rng & Units 02/23/2022    2:43 AM 02/22/2022    2:43 AM 02/21/2022    8:09 PM  CBC EXTENDED  WBC 4.0 - 10.5 K/uL 7.9   7.9   RBC 4.22 - 5.81 MIL/uL 2.17   2.37   Hemoglobin 13.0 - 17.0 g/dL 7.6  7.1  8.4   HCT 39.0 - 52.0 % 22.6  21.0  23.9   Platelets 150 - 400 K/uL 73   83   NEUT# 1.7 - 7.7 K/uL   6.8   Lymph# 0.7 - 4.0 K/uL   0.2     Neurologic: Patient does not have protective sensation bilateral lower extremities.   MUSCULOSKELETAL:   Skin: Examination the left foot has no cellulitis there is pitting edema with swelling.  There are no ulcers no sausage digit swelling of the toes.  Patient has a strong dorsalis pedis pulse bilaterally.  No ulcers on the right foot.  No decubitus heel ulcers bilaterally.  There are no venous stasis ulcers.  Review of the radiographs does show plantar cortical loss of the second metatarsal head with clawing of the MTP joint.  Patient is status post remote great toe amputation the left foot.  The first metatarsal head has no destructive changes.  Hemoglobin 7.6 white cell count 7.9.  Albumin 2.4.  Assessment: Assessment: Altered mental status with bony abnormalities second metatarsal joint left foot without clinical signs of bone infection.  Plan: Would follow the left foot conservatively.  No indication for surgical intervention of the left foot.  No indication for antibiotics for the left foot.  Thank you for the consult and the opportunity to see Mr. Javian Nudd, [place] [phone] 7:05 AM

## 2022-02-23 NOTE — Evaluation (Signed)
Occupational Therapy Evaluation Patient Details Name: Darryl Diaz MRN: [id number] DOB: 1941-04-19 Today's Date: 02/23/2022   History of Present Illness Pt is an 81 y/o M presented from SNF for decreased PO Intake, swelling and redness of the left foot.  Recent admit 9/15 after fall at home with C41f, continues to wear C-collar. PMH includes alcohol abuse, ESRD on HD MWF, CHF, HTN, and HLD.   Clinical Impression   Patient admitted for the diagnosis above.  PTA he was undergoing SNF level rehab after a previous fall.  Patient presenting with lethargy, pain, poor activity tolerance, poor balance and weakness.  Currently he is bedlevel for ADL, and is needing Max A for basic mobility.  Patient does not have the needed 24 hour Max A to transition home.  OT can follow in the acute setting.        Recommendations for follow up therapy are one component of a multi-disciplinary discharge planning process, led by the attending physician.  Recommendations may be updated based on patient status, additional functional criteria and insurance authorization.   Follow Up Recommendations  Skilled nursing-short term rehab (<3 hours/day)    Assistance Recommended at Discharge Frequent or constant Supervision/Assistance  Patient can return home with the following Two people to help with walking and/or transfers;Assist for transportation;A lot of help with bathing/dressing/bathroom    Functional Status Assessment  Patient has had a recent decline in their functional status and demonstrates the ability to make significant improvements in function in a reasonable and predictable amount of time.  Equipment Recommendations  Wheelchair cushion (measurements OT);Wheelchair (measurements OT)    Recommendations for Other Services       Precautions / Restrictions Precautions Precautions: Cervical;Fall Precaution Booklet Issued: No Precaution Comments: bialteral mitts Restrictions Weight Bearing  Restrictions: No      Mobility Bed Mobility Overal bed mobility: Needs Assistance Bed Mobility: Supine to Sit, Sit to Supine     Supine to sit: Max assist, HOB elevated Sit to supine: Max assist, HOB elevated   General bed mobility comments: performed with PT    Transfers                   General transfer comment: unable      Balance                                           ADL either performed or assessed with clinical judgement   ADL Overall ADL's : Needs assistance/impaired Eating/Feeding: Minimal assistance;Bed level   Grooming: Wash/dry hands;Wash/dry face;Set up;Bed level   Upper Body Bathing: Moderate assistance;Bed level   Lower Body Bathing: Bed level;Maximal assistance   Upper Body Dressing : Minimal assistance;Bed level   Lower Body Dressing: Maximal assistance;Bed level                       Vision Baseline Vision/History: 1 Wears glasses Vision Assessment?: No apparent visual deficits     Perception Perception Perception: Not tested   Praxis Praxis Praxis: Not tested    Pertinent Vitals/Pain Pain Assessment Pain Assessment: Faces Faces Pain Scale: Hurts even more Pain Location: back/hip when attempting to elevate HOB Pain Descriptors / Indicators: Grimacing, Discomfort, Crying Pain Intervention(s): Monitored during session     Hand Dominance Right   Extremity/Trunk Assessment Upper Extremity Assessment Upper Extremity Assessment: Generalized weakness   Lower Extremity Assessment  Lower Extremity Assessment: Defer to PT evaluation   Cervical / Trunk Assessment Cervical / Trunk Assessment: Kyphotic   Communication Communication Communication: No difficulties   Cognition Arousal/Alertness: Lethargic Behavior During Therapy: Flat affect Overall Cognitive Status: No family/caregiver present to determine baseline cognitive functioning                                       General  Comments   VSS on RA    Exercises     Shoulder Instructions      Home Living Family/patient expects to be discharged to:: Skilled nursing facility                                        Prior Functioning/Environment Prior Level of Function : History of Falls (last six months)             Mobility Comments: Uses rollator for mobility; reports mulitple falls in the past due to unsteadiness ADLs Comments: Plan is to eventually transition out of his home to ALF.  Patient is having increasing difficulty with ADL, iADL due to balance deficts and falls.        OT Problem List: Decreased strength;Decreased activity tolerance;Impaired balance (sitting and/or standing);Pain      OT Treatment/Interventions: Self-care/ADL training;Therapeutic exercise;Therapeutic activities;Patient/family education;Balance training;DME and/or AE instruction    OT Goals(Current goals can be found in the care plan section) Acute Rehab OT Goals OT Goal Formulation: Patient unable to participate in goal setting Time For Goal Achievement: 03-12-22 Potential to Achieve Goals: Good ADL Goals Pt Will Perform Grooming: with supervision;sitting Pt Will Perform Upper Body Bathing: with min assist;sitting Pt Will Perform Upper Body Dressing: with min assist;sitting Pt Will Perform Lower Body Dressing: with mod assist;sitting/lateral leans Pt Will Transfer to Toilet: with min assist;with +2 assist;stand pivot transfer;bedside commode  OT Frequency: Min 2X/week    Co-evaluation              AM-PAC OT "6 Clicks" Daily Activity     Outcome Measure Help from another person eating meals?: A Little Help from another person taking care of personal grooming?: A Little Help from another person toileting, which includes using toliet, bedpan, or urinal?: Total Help from another person bathing (including washing, rinsing, drying)?: A Lot Help from another person to put on and taking off regular  upper body clothing?: A Lot Help from another person to put on and taking off regular lower body clothing?: A Lot 6 Click Score: 13   End of Session Nurse Communication: Mobility status  Activity Tolerance: Patient limited by pain Patient left: in bed;with call bell/phone within reach  OT Visit Diagnosis: Unsteadiness on feet (R26.81);History of falling (Z91.81)                Time: 7048-8891 OT Time Calculation (min): 11 min Charges:  OT General Charges $OT Visit: 1 Visit OT Evaluation $OT Eval Moderate Complexity: 1 Mod  02/23/2022  RP, OTR/L  Acute Rehabilitation Services  Office:  [phone]   Metta Clines 02/23/2022, 3:29 PM

## 2022-02-23 NOTE — Progress Notes (Signed)
[facility] Progress Note   Subjective:   Patient seen an examined in emergency department. Reports pain in his L hip area. Denies any neck pain. He became very anxious during his MRI earlier today but reports he is feeling better. Is very thirsty and would like some milk. Denies HA, dizziness, CP, SOB, N/V.   Objective Vitals:   02/23/22 1000 02/23/22 1030 02/23/22 1115 02/23/22 1200  BP: (!) 123/52 (!) 109/54  118/78  Pulse: (!) 191 (!) 39  72  Resp: (!) 26 (!) 29    Temp:   98.4 F (36.9 C)   TempSrc:   Oral   SpO2: (!) 87% (!) 83%  94%  Weight:      Height:       Physical Exam General:Lethargic, chronically ill appearing male in NAD Heart:RRR. No murmurs, rubs or gallops Lungs: CTA bilaterally. No wheeze, rales or rhonchi. Breathing is unlabored.  Abdomen:Soft, mild tenderness to LLQ with palpation, no guarding or rebound tenderness Extremities: No edema, ischemic changes, or open wounds Dialysis Access: LUE AVF   Filed Weights   02/21/22 1959  Weight: 77.6 kg    Intake/Output Summary (Last 24 hours) at 02/23/2022 1310 Last data filed at 02/22/2022 1403 Gross per 24 hour  Intake --  Output 0.8 ml  Net -0.8 ml    Additional Objective Labs: Basic Metabolic Panel: Recent Labs  Lab 02/21/22 2009 02/22/22 0230 02/22/22 0243 02/23/22 0243  NA 137 135 135 138  K 3.2* 3.0* 2.9* 3.6  CL 98 100  --  99  CO2 25 25  --  25  GLUCOSE 92 96  --  99  BUN 20 24*  --  22  CREATININE 2.05* 2.32*  --  2.17*  CALCIUM 8.2* 7.9*  --  8.1*  PHOS  --  2.4*  --   --    Liver Function Tests: Recent Labs  Lab 02/20/22 2308 02/21/22 2009 02/22/22 0230  AST 28 40 37  ALT _0 ALKPHOS 103 76 62  BILITOT 1.4* 1.6* 1.6*  PROT 5.5* 5.2* 4.8*  ALBUMIN 3.0* 2.6* 2.4*   CBC: Recent Labs  Lab 02/20/22 2308 02/21/22 2009 02/22/22 0243 02/23/22 0243  WBC 5.7 7.9  --  7.9  NEUTROABS 4.8 6.8  --   --   HGB 9.3* 8.4* 7.1* 7.6*  HCT 26.4* 23.9* 21.0*  22.6*  MCV 101.5* 100.8*  --  104.1*  PLT 93* 83*  --  73*   Blood Culture    Component Value Date/Time   SDES BLOOD SITE NOT SPECIFIED 02/23/2022 0243   SDES BLOOD SITE NOT SPECIFIED 02/23/2022 0243   SPECREQUEST  02/23/2022 0243    BOTTLES DRAWN AEROBIC AND ANAEROBIC Blood Culture results may not be optimal due to an inadequate volume of blood received in culture bottles   SPECREQUEST  02/23/2022 0243    BOTTLES DRAWN AEROBIC AND ANAEROBIC Blood Culture adequate volume   CULT  02/23/2022 0243    NO GROWTH < 12 HOURS Performed at [facility], [place] [address], [place], [place] [address]    CULT  02/23/2022 0243    NO GROWTH < 12 HOURS Performed at [facility] [address] [place], [place] [address]    REPTSTATUS PENDING 02/23/2022 0243   REPTSTATUS PENDING 02/23/2022 0243   Cardiac Enzymes: Recent Labs  Lab 02/22/22 0230  CKTOTAL 256   Iron Studies:  Recent Labs    02/22/22 0230  IRON  15*  TIBC 150*  FERRITIN 1,703*   Lab Results  Component Value Date   INR 1.4 (H) 02/22/2022   INR 1.4 (H) 02/21/2022   INR 1.1 11/23/2021   Studies/Results: MR CERVICAL SPINE WO CONTRAST  Result Date: 02/23/2022 CLINICAL DATA:  Rule out discitis osteomyelitis.  MSSA bacteremia. EXAM: MRI CERVICAL SPINE WITHOUT CONTRAST TECHNIQUE: Multiplanar, multisequence MR imaging of the cervical spine was performed. No intravenous contrast was administered. COMPARISON:  MRI thoracic spine 02/22/2022 FINDINGS: Alignment: Image quality degraded by motion Mild retrolisthesis C5-6. Vertebrae: Hemangioma T1 vertebral body.  Negative for fracture Prominent fluid in the disc space at C6-7 with mild edema in the adjacent endplates. Mild prevertebral fluid also present. Cord: Cord evaluation limited given the degree of motion. No cord compression or cord lesion identified. Posterior Fossa, vertebral arteries, paraspinal tissues: Prevertebral edema is present. No paraspinous fluid collection.  No mass or adenopathy Disc levels: C2-3: Mild disc and facet degeneration. Mild foraminal narrowing bilaterally. C3-4: Disc and facet degeneration. Mild central canal stenosis and mild foraminal narrowing bilaterally due to spurring C4-5: Disc and facet degeneration. Mild central canal stenosis and mild foraminal narrowing bilaterally due to spurring C5-6: Disc and facet degeneration. Mild spinal stenosis and mild foraminal narrowing bilaterally due to spurring C6-7: Large amount of fluid in the disc space with edema in the adjacent endplates. Fluid appears contiguous with prevertebral fluid. Findings are suspicious for discitis and osteomyelitis. No epidural abscess. Right-sided disc and osteophyte complex flattening the cord. There is moderate right foraminal narrowing and mild left foraminal narrowing. Mild to moderate central canal stenosis. C7-T1: Negative for stenosis. IMPRESSION: 1. Image quality degraded by motion. 2. Large amount of fluid in the disc space at C6-7 with edema in the adjacent endplates and mild prevertebral fluid. Findings are suspicious for discitis and osteomyelitis. No epidural abscess. Right-sided disc and osteophyte complex causing cord deformity and moderate right foraminal narrowing. Mild to moderate spinal stenosis. 3. 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 Electronically Signed   By: Franchot Gallo M.D.   On: 02/23/2022 11:26   MR LUMBAR SPINE WO CONTRAST  Result Date: 02/23/2022 CLINICAL DATA:  Initial evaluation for staph bacteremia. EXAM: MRI LUMBAR SPINE WITHOUT CONTRAST TECHNIQUE: Multiplanar, multisequence MR imaging of the lumbar spine was performed. No intravenous contrast was administered. COMPARISON:  Prior CT from 02/21/2022. FINDINGS: Segmentation: Examination is severely degraded by motion artifact, markedly limiting assessment. Standard segmentation. Alignment: Physiologic with preservation of the normal  lumbar lordosis. No significant listhesis. Vertebrae: Vertebral body height grossly maintained with no acute or chronic fracture. Bone marrow signal intensity diffusely heterogeneous. No worrisome osseous lesions. Fluid signal intensity with adjacent mild reactive endplate changes noted at the T12-L1, L1-2, L2-3, L3-4, and L4-5 interspaces. While these findings are in large part suspected to be degenerative, possible infection with osteomyelitis discitis is difficult to exclude on this severely motion degraded exam. Question of mild paraspinous edema within the adjacent psoas musculature. Conus medullaris and cauda equina: Conus extends to the L1 level. Conus and cauda equina are grossly within normal limits. No visible epidural collections, although evaluation is fairly limited. Paraspinal and other soft tissues: Question mild paraspinous edema within the psoas musculature bilaterally. No visible collections. Disc levels: T12-L1: Disc bulge with endplate spurring. Left greater than right facet hypertrophy. No significant spinal stenosis. Foramina appear grossly patent. L1-2: Intervertebral disc space narrowing with diffuse disc bulge and reactive endplate change. Moderate facet hypertrophy. At least moderate  spinal stenosis. Foramina appear grossly patent. L2-3: Degenerative intervertebral disc space narrowing with diffuse disc bulge and reactive endplate change. Moderate facet hypertrophy. Resultant severe spinal stenosis. Moderate right L2 foraminal narrowing. Left neural foramen remains patent. L3-4: Degenerative intervertebral disc space narrowing with diffuse disc bulge and endplate spurring. Moderate facet arthrosis. Resultant severe spinal stenosis. Foramina appear grossly patent. L4-5: Degenerative intervertebral disc space narrowing with diffuse disc bulge and reactive endplate spurring. Moderate facet hypertrophy. Moderate canal with severe left and moderate right lateral recess stenosis. Mild to moderate  left L4 foraminal narrowing. Right neural foramen remains patent. L5-S1: Advanced degenerative intervertebral disc space narrowing with diffuse disc bulge and reactive endplate spurring. Moderate facet hypertrophy. Moderate left greater than right lateral recess stenosis. Central canal remains patent. No significant foraminal stenosis. IMPRESSION: 1. Severely motion degraded exam, markedly limiting assessment. 2. Fluid signal intensity with adjacent mild reactive endplate changes at the T12-L1, L1-2, L2-3, L3-4, and L4-5 interspaces. While these findings could be degenerative in nature, possible infection with osteomyelitis discitis is difficult to exclude on this severely motion degraded exam. Correlation with symptomatology and laboratory values recommended. 3. 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 Electronically Signed   By: Jeannine Boga M.D.   On: 02/23/2022 01:29   MR THORACIC SPINE WO CONTRAST  Result Date: 02/23/2022 CLINICAL DATA:  Initial evaluation for staph bacteremia. EXAM: MRI THORACIC SPINE WITHOUT CONTRAST TECHNIQUE: Multiplanar, multisequence MR imaging of the thoracic spine was performed. No intravenous contrast was administered. COMPARISON:  Prior CT from 02/21/2022. FINDINGS: Alignment: Examination is severely degraded by motion artifact, markedly limiting assessment. Mild exaggeration of the normal thoracic kyphosis.  No listhesis. Vertebrae: Vertebral body height grossly maintained without acute or chronic fracture. Bone marrow signal intensity diffusely heterogeneous. 1 cm T1/T2 hyperintense lesion within the T1 vertebral body, likely a hemangioma. No definite worrisome osseous lesions. Fluid signal intensity with mild disc space widening and reactive endplate edema noted about the partially visualized C6-7 interspace (series 6, image 10). Suggestion of mild paraspinous  edema within this region. While these findings could be degenerative, possible osteomyelitis discitis could also have this appearance. Otherwise, no other convincing evidence for acute infection elsewhere within the thoracic spine on this motion degraded exam. Cord: Grossly normal signal and morphology. No visible epidural collections. Paraspinal and other soft tissues: Probable mild paraspinous edema adjacent to the C6-7 interspace. No other visible paraspinous soft tissue abnormality. Layering bilateral pleural effusions with associated atelectasis and/or consolidation. Disc levels: Multilevel disc desiccation with mild noncompressive disc bulging and reactive endplate spurring seen throughout the thoracic spine. No significant or high-grade spinal stenosis. Foramina appear grossly patent. IMPRESSION: 1. Severely motion degraded exam. 2. Fluid signal intensity with reactive endplate in paraspinous edema about the partially visualized C6-7 interspace. While these findings could be degenerative in nature, possible osteomyelitis discitis could also have this appearance. Correlation with symptomatology and laboratory values recommended. Further evaluation with dedicated MRI of the cervical spine could be performed for further evaluation as the patient is able to tolerate. 3. No other convincing evidence for acute infection within the thoracic spine. 4. Layering bilateral pleural effusions with associated atelectasis and/or consolidation. Electronically Signed   By: Jeannine Boga M.D.   On: 02/23/2022 01:20   ECHOCARDIOGRAM COMPLETE  Result Date: 02/22/2022    ECHOCARDIOGRAM REPORT   Patient Name:   Darryl Diaz Date of Exam: 02/22/2022 Medical Rec #:  [id number]  Height:       72.0 in Accession #:    [id number]       Weight:       171.0 lb Date of Birth:  08/11/1940        BSA:          1.993 m Patient Age:    31 years         BP:           110/43 mmHg Patient Gender: M                HR:            124 bpm. Exam Location:  Inpatient Procedure: 2D Echo, Cardiac Doppler and Color Doppler Indications:    elevated trops  History:        Patient has prior history of Echocardiogram examinations, most                 recent 01/17/2021. CAD, Arrythmias:Bradycardia; Risk                 Factors:Hypertension and Dyslipidemia.  Sonographer:    Melissa Morford RDCS (AE, PE) Referring Phys: [place]  1. Left ventricular ejection fraction, by estimation, is 60 to 65%. The left ventricle has normal function. The left ventricle has no regional wall motion abnormalities. Left ventricular diastolic parameters are consistent with Grade I diastolic dysfunction (impaired relaxation). Elevated left ventricular end-diastolic pressure. The E/e' is 48.  2. Right ventricular systolic function is normal. The right ventricular size is normal. There is normal pulmonary artery systolic pressure. The estimated right ventricular systolic pressure is 43.5 mmHg.  3. Left atrial size was moderately dilated.  4. The mitral valve is abnormal. Mild mitral valve regurgitation. Moderate mitral annular calcification.  5. The aortic valve is tricuspid. Aortic valve regurgitation is not visualized. Mild aortic valve stenosis. Aortic valve area, by VTI measures 2.45 cm. Aortic valve mean gradient measures 12.5 mmHg. Aortic valve Vmax measures 2.18 m/s.  6. Aortic dilatation noted. There is borderline dilatation of the ascending aorta, measuring 39 mm.  7. The inferior vena cava is dilated in size with <50% respiratory variability, suggesting right atrial pressure of 15 mmHg.  8. Cannot exclude a small PFO. Comparison(s): Changes from prior study are noted. 01/17/2021: LVEF 60-65%, mild AS - mean gradient 12 mmHg. FINDINGS  Left Ventricle: Left ventricular ejection fraction, by estimation, is 60 to 65%. The left ventricle has normal function. The left ventricle has no regional wall motion abnormalities. The left ventricular  internal cavity size was normal in size. There is  no left ventricular hypertrophy. Left ventricular diastolic parameters are consistent with Grade I diastolic dysfunction (impaired relaxation). Elevated left ventricular end-diastolic pressure. The E/e' is 15. Right Ventricle: The right ventricular size is normal. No increase in right ventricular wall thickness. Right ventricular systolic function is normal. There is normal pulmonary artery systolic pressure. The tricuspid regurgitant velocity is 1.63 m/s, and  with an assumed right atrial pressure of 15 mmHg, the estimated right ventricular systolic pressure is 68.6 mmHg. Left Atrium: Left atrial size was moderately dilated. Right Atrium: Right atrial size was normal in size. Pericardium: There is no evidence of pericardial effusion. Mitral Valve: The mitral valve is abnormal. There is moderate calcification of the posterior mitral valve leaflet(s). Moderate mitral annular calcification. Mild mitral valve regurgitation. Tricuspid Valve: The tricuspid valve is grossly normal. Tricuspid valve regurgitation is trivial. Aortic Valve: The aortic valve is tricuspid.  Aortic valve regurgitation is not visualized. Mild aortic stenosis is present. Aortic valve mean gradient measures 12.5 mmHg. Aortic valve peak gradient measures 19.0 mmHg. Aortic valve area, by VTI measures 2.45 cm. Pulmonic Valve: The pulmonic valve was grossly normal. Pulmonic valve regurgitation is trivial. Aorta: Aortic dilatation noted. There is borderline dilatation of the ascending aorta, measuring 39 mm. Venous: The inferior vena cava is dilated in size with less than 50% respiratory variability, suggesting right atrial pressure of 15 mmHg. IAS/Shunts: Cannot exclude a small PFO.  LEFT VENTRICLE PLAX 2D LVIDd:         5.33 cm      Diastology LVIDs:         4.13 cm      LV e' medial:    8.92 cm/s LV PW:         0.93 cm      LV E/e' medial:  13.9 LV IVS:        0.87 cm      LV e' lateral:   6.64 cm/s  LVOT diam:     2.10 cm      LV E/e' lateral: 18.7 LV SV:         108 LV SV Index:   54 LVOT Area:     3.46 cm  LV Volumes (MOD) LV vol d, MOD A2C: 143.0 ml LV vol d, MOD A4C: 162.0 ml LV vol s, MOD A2C: 70.6 ml LV vol s, MOD A4C: 65.8 ml LV SV MOD A2C:     72.4 ml LV SV MOD A4C:     162.0 ml LV SV MOD BP:      88.1 ml RIGHT VENTRICLE RV S prime:     11.30 cm/s TAPSE (M-mode): 2.6 cm LEFT ATRIUM             Index        RIGHT ATRIUM           Index LA diam:        4.20 cm 2.11 cm/m   RA Area:     14.50 cm LA Vol (A2C):   77.0 ml 38.63 ml/m  RA Volume:   34.20 ml  17.16 ml/m LA Vol (A4C):   87.3 ml 43.80 ml/m LA Biplane Vol: 87.7 ml 44.00 ml/m  AORTIC VALVE AV Area (Vmax):    2.38 cm AV Area (Vmean):   2.31 cm AV Area (VTI):     2.45 cm AV Vmax:           218.00 cm/s AV Vmean:          167.000 cm/s AV VTI:            0.440 m AV Peak Grad:      19.0 mmHg AV Mean Grad:      12.5 mmHg LVOT Vmax:         150.00 cm/s LVOT Vmean:        111.500 cm/s LVOT VTI:          0.310 m LVOT/AV VTI ratio: 0.71  AORTA Ao Root diam: 3.60 cm Ao Asc diam:  3.90 cm MITRAL VALVE                TRICUSPID VALVE MV Area (PHT): 4.80 cm     TR Peak grad:   10.6 mmHg MV Decel Time: 158 msec     TR Vmax:        163.00 cm/s MV E velocity: 124.00 cm/s MV A velocity: 121.00  cm/s  SHUNTS MV E/A ratio:  1.02         Systemic VTI:  0.31 m                             Systemic Diam: 2.10 cm Lyman Bishop MD Electronically signed by Lyman Bishop MD Signature Date/Time: 02/22/2022/11:01:26 AM    Final    DG Foot 2 Views Left  Result Date: 02/22/2022 CLINICAL DATA:  Cellulitis EXAM: LEFT FOOT - 2 VIEW COMPARISON:  Radiographs 11/02/2021; MRI 06/08/2021; radiographs 06/07/2021 FINDINGS: 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 Osseous destruction at the base of the second proximal phalanx in the head of the second metatarsal consistent with osteomyelitis. Question osteo myelitis of the medial aspect of the fourth metatarsal head  versus projection artifact. Remote posttraumatic deformity about the head of the third metatarsal. 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 Postsurgical changes about the ankle. 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 Question osteomyelitis of the medial aspect of the fourth metatarsal head. Electronically Signed   By: Placido Sou M.D.   On: 02/22/2022 02:04   DG Chest 1 View  Result Date: 02/21/2022 CLINICAL DATA:  Weakness EXAM: CHEST  1 VIEW COMPARISON:  02/20/2022 FINDINGS: Stable cardiomediastinal contours. Slightly low lung volumes with bibasilar atelectasis. Trace effusions. No pneumothorax. Advanced degenerative changes of the left glenohumeral joint. IMPRESSION: Bibasilar atelectasis and trace bilateral pleural effusions. Electronically Signed   By: Davina Poke D.O.   On: 02/21/2022 20:42    Medications:  sodium chloride     anticoagulant sodium citrate      ceFAZolin (ANCEF) IV Stopped (02/22/22 1840)    amiodarone  200 mg Oral q morning   Chlorhexidine Gluconate Cloth  6 each Topical Q0600   escitalopram  10 mg Oral q morning   lidocaine  3 patch Transdermal Q24H   sodium chloride flush  3 mL Intravenous Q12H   tamsulosin  0.4 mg Oral QHS    Dialysis Orders: TTS - NW 4hrs, BFR 400/AF 1.5,  EDW 79.8 kg, 3.0 K/ 2.5 Ca   Access: LU AVF  No Heparin  Mircera 150 mcg q2wks - last 9/27  Assessment/Plan: Sepsis due to gram-positive bacteremia in setting of left foot cellulitis and left foot osteomyelitis - Management per primary team -SIRS criteria met with tachycardia, fever, RR >20 -Received ~2L lactated ringers in ED.  -CT with likely PNA.  Started on broad spectrum ABX. ID consulted and ABX narrowed to IV cefazolin.  -Blood cultures 02/20/2021 with gram-positive cocci in clusters -Surveillance blood cultures 02/22/2022 negative -ID recommended MRI thoracic/lumbar spine and TEE -MRI thoracic showed signal intensity at C6-C7  interspace which could be degenerative vs osteomyelitis -MRI lumbar showed spondylosis and spinal stenosis of L1 and L2 -MRI cervical spine with findings suspicious for discitis and osteomyelitis. No epidural abscess.  -TEE to be attempted 02/25/2022 per cardiology. 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 Sharol Given with orthopedics. No surgical intervention indicated at this time - Does not think foot is cause of sepsis  Elevated troponin  - Management per primary team. Cardiology consulted.  -Likely demand ischemia in setting of bacteremia and osteomyelitis per cardiology.  -No ischemic evaluation needed at this time.    4. Acute hyponatremia - resolved.   5. Hypokalemia - K 3.6 today  -  Continue to monitor    6. Diarrhea - improved   7. ESRD  -On HD TTS -Last HD 02/22/2022. Tolerated well.  -Plan on HD tomorrow per regular schedule  -AVF does not appear to be source of bacteremia               8. Hypertension/volume   - Allow permissive HTN per primary team - Coreg held on admission - Does not appear fluid overloaded.  Received 2L in ED.  Left ~3L over dry weight last HD. Plan for UF as tolerated. Possible weight gain, may need to adjust dry.    9. Anemia of CKD  - Hgb 7.6 - ESA due 10/19 - increase dose to 262mg qwk - tsat 10%, ferritin 1700, no IV iron.  - Transfuse if Hbg <7.0   10. Secondary Hyperparathyroidism  - Ca 7.9, CCa 9.2, Phos 2.4 - Not on VDRA or binders.    11. Nutrition - On dysphagia diet w/fluid restrictions   12. Paroxysmal afib - not on AC 13. Hyperlipidema - continue statins 14. GERD 15. 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 CAD  ACathleen Fears WBattle CreekPA-S2  Progress note written with assistance by PA student.  PA performed own history and physical exam.  Plan discussed with student and documented.  LJen Mow  PA-C [facility] 02/23/2022,1:10 PM  LOS: 1 day

## 2022-02-23 NOTE — ED Notes (Signed)
Patient transported to MRI 

## 2022-02-23 NOTE — Consult Note (Signed)
Reason for Consult:Cervical discitis Referring Physician: Ferron, Darryl Diaz is an 81 y.o. male.  HPI: admitted  / seen in the ED multiple times in the last 6 weeks. Most recently found to have a bacteremia due to Staph. Aureus. MRi shows possible discitis in the face of normal strength. He is thrombocytopenic, has a host of medical problems and under current circumstances is a very poor operative candidate. Asked to evaluate cervical discitis And possible osteomyelitis. Past Medical History:  Diagnosis Date   Alcohol abuse 13/12/6576   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[place]) 03/17/2020   Alcoholism ([place])    in remission following wife's death   Anemia    Anxiety    Ascending aorta dilation (HCC)    Atrial fibrillation (HCC)    Cardiac arrest ([place]) 04/20/2019   12 min CPR with epi   Chronic diastolic CHF (congestive heart failure) ([place])    Dysrhythmia    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[place])    Mon Wed Fri   Fatty liver    Fracture of hip ([place]) 03/30/2016   Gastritis    GERD (gastroesophageal reflux disease)    H/O seasonal allergies    Heme positive stool 05/14/2020   History of blood transfusion    History of GI bleed    Hx of adenomatous colonic polyps 08/10/2017   Hyperlipidemia    Hypertension    Insomnia    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   Mallory-Weiss tear    OA (osteoarthritis)    hands   OSA (obstructive sleep apnea)    No longer uses CPAP   Persistent atrial fibrillation with rapid ventricular response ([place]) 02/05/2020   Posterior dislocation of hip, closed ([place]) 04/30/2016   Recurrent syncope    Restless legs syndrome (RLS)    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([place])     Past Surgical History:  Procedure Laterality Date   AMPUTATION Left 05/19/2021   Procedure: LEFT GREAT TOE AMPUTATION;  Surgeon: Newt Minion, MD;  Location: [facility];  Service: Orthopedics;  Laterality: Left;   ANKLE SURGERY Left 2014   had rods put in     AV FISTULA PLACEMENT Left 04/21/2021   Procedure: LEFT ARM ARTERIOVENOUS (AV) FISTULA CREATION - Left brachiocephalic fistula;  Surgeon: Marty Heck, MD;  Location: [facility];  Service: Vascular;  Laterality: Left;   BIOPSY  02/28/2019   Procedure: BIOPSY;  Surgeon: Milus Banister, MD;  Location: [facility];  Service: Endoscopy;;   CARDIOVERSION  02/06/2020   CARDIOVERSION N/A 02/06/2020   Procedure: CARDIOVERSION;  Surgeon: Werner Lean, MD;  Location: [facility];  Service: Cardiovascular;  Laterality: N/A;   CARDIOVERSION N/A 02/24/2020   Procedure: CARDIOVERSION;  Surgeon: Werner Lean, MD;  Location: [facility];  Service: Cardiovascular;  Laterality: N/A;   COLONOSCOPY     [place]   COLONOSCOPY WITH PROPOFOL N/A 01/08/2021   Procedure: COLONOSCOPY WITH PROPOFOL;  Surgeon: Milus Banister, MD;  Location: [facility];  Service: Endoscopy;  Laterality: N/A;   ESOPHAGOGASTRODUODENOSCOPY (EGD) WITH PROPOFOL N/A 02/28/2019   Procedure: ESOPHAGOGASTRODUODENOSCOPY (EGD) WITH PROPOFOL;  Surgeon: Milus Banister, MD;  Location: [facility] ENDOSCOPY;  Service: Endoscopy;  Laterality: N/A;   FRACTURE SURGERY     HIP ARTHROPLASTY Left 04/01/2016   Procedure: ARTHROPLASTY BIPOLAR HIP (HEMIARTHROPLASTY);  Surgeon: Paralee Cancel, MD;  Location: [facility];  Service: Orthopedics;  Laterality: Left;   HIP CLOSED REDUCTION Left 04/30/2016   Procedure: CLOSED REDUCTION HIP;  Surgeon: Wylene Simmer, MD;  Location: [facility];  Service: Orthopedics;  Laterality: Left;   HOT HEMOSTASIS N/A 01/08/2021   Procedure: HOT HEMOSTASIS (ARGON PLASMA COAGULATION/BICAP);  Surgeon: Milus Banister, MD;  Location: [facility] ENDOSCOPY;  Service: Endoscopy;  Laterality: N/A;   INSERTION OF DIALYSIS CATHETER N/A 01/25/2022   Procedure: INSERTION OF TUNNELED DIALYSIS CATHETER;  Surgeon: Angelia Mould, MD;  Location: [facility];  Service: Vascular;  Laterality: N/A;   IR RADIOLOGIST EVAL & MGMT  12/31/2020    JOINT REPLACEMENT     TONSILLECTOMY     TOTAL HIP ARTHROPLASTY Right 10/15/2019   Procedure: TOTAL HIP ARTHROPLASTY ANTERIOR APPROACH;  Surgeon: Paralee Cancel, MD;  Location: [facility];  Service: Orthopedics;  Laterality: Right;  70 mins   VASCULAR SURGERY      Family History  Problem Relation Age of Onset   Heart disease Mother 19   Hypertension Mother    Arthritis Mother    Heart failure Father 34   Stroke Maternal Aunt    Heart failure Sister    Colon cancer Neg Hx    Esophageal cancer Neg Hx    Stomach cancer Neg Hx    Rectal cancer Neg Hx     Social History:  reports that he has quit smoking. His smoking use included cigarettes. He has a 20.00 pack-year smoking history. He has never used smokeless tobacco. He reports that he does not currently use alcohol after a past usage of about 3.0 standard drinks of alcohol per week. He reports that he does not use drugs.  Allergies:  Allergies  Allergen Reactions   Penicillin G Sodium Rash and Other (See Comments)     (tolerates Keflex)  NOT DOCUMENTED ON THE MAR   Penicillins Rash    NOT DOCUMENTED ON THE MAR    Medications: I have reviewed the patient's current medications.  Results for orders placed or performed during the hospital encounter of 02/21/22 (from the past 48 hour(s))  Lactic acid, plasma     Status: Abnormal   Collection Time: 02/21/22  8:09 PM  Result Value Ref Range   Lactic Acid, Venous 2.8 (HH) 0.5 - 1.9 mmol/L    Comment: CRITICAL RESULT CALLED TO, READ BACK BY AND VERIFIED WITH J. [place], 2113, 02/21/22, EADEDOKUN Performed at [facility], [place] [address] [place], [place] [address]   Comprehensive metabolic panel     Status: Abnormal   Collection Time: 02/21/22  8:09 PM  Result Value Ref Range   Sodium 137 135 - 145 mmol/L    Comment: DELTA CHECK NOTED   Potassium 3.2 (L) 3.5 - 5.1 mmol/L   Chloride 98 98 - 111 mmol/L   CO2 25 22 - 32 mmol/L   Glucose, Bld 92 70 - 99 mg/dL    Comment:  Glucose reference range applies only to samples taken after fasting for at least 8 hours.   BUN 20 8 - 23 mg/dL   Creatinine, Ser 2.05 (H) 0.61 - 1.24 mg/dL   Calcium 8.2 (L) 8.9 - 10.3 mg/dL   Total Protein 5.2 (L) 6.5 - 8.1 g/dL   Albumin 2.6 (L) 3.5 - 5.0 g/dL   AST 40 15 - 41 U/L   ALT 17 0 - 44 U/L   Alkaline Phosphatase 76 38 - 126 U/L   Total Bilirubin 1.6 (H) 0.3 - 1.2 mg/dL   GFR, Estimated 32 (L) >60 mL/min    Comment: (NOTE) Calculated using the CKD-EPI Creatinine Equation (2021)  Anion gap 14 5 - 15    Comment: Performed at [facility] [address] [place], [place] [address]  CBC with Differential     Status: Abnormal   Collection Time: 02/21/22  8:09 PM  Result Value Ref Range   WBC 7.9 4.0 - 10.5 K/uL   RBC 2.37 (L) 4.22 - 5.81 MIL/uL   Hemoglobin 8.4 (L) 13.0 - 17.0 g/dL   HCT 23.9 (L) 39.0 - 52.0 %   MCV 100.8 (H) 80.0 - 100.0 fL   MCH 35.4 (H) 26.0 - 34.0 pg   MCHC 35.1 30.0 - 36.0 g/dL   RDW 14.6 11.5 - 15.5 %   Platelets 83 (L) 150 - 400 K/uL    Comment: REPEATED TO VERIFY   nRBC 0.0 0.0 - 0.2 %   Neutrophils Relative % 86 %   Neutro Abs 6.8 1.7 - 7.7 K/uL   Lymphocytes Relative 3 %   Lymphs Abs 0.2 (L) 0.7 - 4.0 K/uL   Monocytes Relative 7 %   Monocytes Absolute 0.6 0.1 - 1.0 K/uL   Eosinophils Relative 0 %   Eosinophils Absolute 0.0 0.0 - 0.5 K/uL   Basophils Relative 0 %   Basophils Absolute 0.0 0.0 - 0.1 K/uL   Immature Granulocytes 4 %   Abs Immature Granulocytes 0.28 (H) 0.00 - 0.07 K/uL    Comment: Performed at [facility] [address], [place], [place] [address]  Protime-INR     Status: Abnormal   Collection Time: 02/21/22  8:09 PM  Result Value Ref Range   Prothrombin Time 17.3 (H) 11.4 - 15.2 seconds   INR 1.4 (H) 0.8 - 1.2    Comment: (NOTE) INR goal varies based on device and disease states. Performed at [facility], [place] [address] [place], [place] [address]   Troponin I (High Sensitivity)     Status:  Abnormal   Collection Time: 02/21/22  8:09 PM  Result Value Ref Range   Troponin I (High Sensitivity) 375 (HH) <18 ng/L    Comment: CRITICAL RESULT CALLED TO, READ BACK BY AND VERIFIED WITH J. [facility], [place], 2124, 02/21/22, EADEDOKUN (NOTE) Elevated high sensitivity troponin I (hsTnI) values and significant  changes across serial measurements may suggest ACS but many other  chronic and acute conditions are known to elevate hsTnI results.  Refer to the "Links" section for chest pain algorithms and additional  guidance. Performed at [facility], [place] [address], [place], [place] [address]   Magnesium     Status: None   Collection Time: 02/21/22  8:09 PM  Result Value Ref Range   Magnesium 1.7 1.7 - 2.4 mg/dL    Comment: Performed at [facility], [place] [address] [place], [place] [address]  Blood Culture (routine x 2)     Status: Abnormal (Preliminary result)   Collection Time: 02/21/22  8:42 PM   Specimen: BLOOD RIGHT FOREARM  Result Value Ref Range   Specimen Description BLOOD RIGHT FOREARM    Special Requests      BOTTLES DRAWN AEROBIC AND ANAEROBIC Blood Culture adequate volume   Culture  Setup Time      GRAM POSITIVE COCCI IN CLUSTERS IN BOTH AEROBIC AND ANAEROBIC BOTTLES CRITICAL VALUE NOTED.  VALUE IS CONSISTENT WITH PREVIOUSLY REPORTED AND CALLED VALUE.    Culture (A)     STAPHYLOCOCCUS AUREUS SUSCEPTIBILITIES PERFORMED ON PREVIOUS CULTURE WITHIN THE LAST 5 DAYS. Performed at [facility], [address] [place], [place] [address]    Report Status PENDING   Blood Culture (routine x 2)     Status: Abnormal (Preliminary result)   Collection Time: 02/21/22  8:49 PM   Specimen: BLOOD  Result Value Ref Range   Specimen Description BLOOD RIGHT ANTECUBITAL    Special Requests      BOTTLES DRAWN AEROBIC AND ANAEROBIC Blood Culture adequate volume   Culture  Setup Time      GRAM POSITIVE COCCI IN CLUSTERS IN BOTH AEROBIC AND ANAEROBIC BOTTLES CRITICAL RESULT  CALLED TO, READ BACK BY AND VERIFIED WITH: PHARMD E [place]BOCK [id number] AT 1234 BY CM    Culture (A)     STAPHYLOCOCCUS AUREUS SUSCEPTIBILITIES PERFORMED ON PREVIOUS CULTURE WITHIN THE LAST 5 DAYS. Performed at [facility], [place] [address] [place], [place] [address]    Report Status PENDING   Lactic acid, plasma     Status: Abnormal   Collection Time: 02/21/22 11:00 PM  Result Value Ref Range   Lactic Acid, Venous 2.0 (HH) 0.5 - 1.9 mmol/L    Comment: CRITICAL VALUE NOTED. VALUE IS CONSISTENT WITH PREVIOUSLY REPORTED/CALLED VALUE Performed at [facility], [place] [address] [place], [place] [address]   Troponin I (High Sensitivity)     Status: Abnormal   Collection Time: 02/21/22 11:00 PM  Result Value Ref Range   Troponin I (High Sensitivity) 346 (HH) <18 ng/L    Comment: DELTA CHECK NOTED CRITICAL VALUE NOTED. VALUE IS CONSISTENT WITH PREVIOUSLY REPORTED/CALLED VALUE (NOTE) Elevated high sensitivity troponin I (hsTnI) values and significant  changes across serial measurements may suggest ACS but many other  chronic and acute conditions are known to elevate hsTnI results.  Refer to the "Links" section for chest pain algorithms and additional  guidance. Performed at [facility], [place] [address], [place], [place] [address]   Ammonia     Status: None   Collection Time: 02/22/22  2:30 AM  Result Value Ref Range   Ammonia 13 9 - 35 umol/L    Comment: Performed at [facility], [place] [address], [place], [place] [address]  CK     Status: None   Collection Time: 02/22/22  2:30 AM  Result Value Ref Range   Total CK 256 49 - 397 U/L    Comment: Performed at [facility], [place] [address] [place], [place] [address]  Lactic acid, plasma     Status: None   Collection Time: 02/22/22  2:30 AM  Result Value Ref Range   Lactic Acid, Venous 1.4 0.5 - 1.9 mmol/L    Comment: Performed at [facility] [address], [place], [place] [address]  Osmolality      Status: None   Collection Time: 02/22/22  2:30 AM  Result Value Ref Range   Osmolality 282 275 - 295 mOsm/kg    Comment: Performed at [facility], [place] [place], [place] [address]  Phosphorus     Status: Abnormal   Collection Time: 02/22/22  2:30 AM  Result Value Ref Range   Phosphorus 2.4 (L) 2.5 - 4.6 mg/dL    Comment: Performed at [facility], [place] [address] [place], [place] [address]  Troponin I (High Sensitivity)     Status: Abnormal   Collection Time: 02/22/22  2:30 AM  Result Value Ref Range   Troponin I (High Sensitivity) 524 (HH) <18 ng/L    Comment: DELTA CHECK NOTED  CRITICAL VALUE NOTED. VALUE IS CONSISTENT WITH PREVIOUSLY REPORTED/CALLED VALUE (NOTE) Elevated high sensitivity troponin I (hsTnI) values and significant  changes across serial measurements may suggest ACS but many other  chronic and acute conditions are known to elevate hsTnI results.  Refer to the "Links" section for chest pain algorithms and additional  guidance. Performed at [facility], [place] [address] [place], [place] [address]   Vitamin B12     Status: None   Collection Time: 02/22/22  2:30 AM  Result Value Ref Range   Vitamin B-12 826 180 - 914 pg/mL    Comment: (NOTE) This assay is not validated for testing neonatal or myeloproliferative syndrome specimens for Vitamin B12 levels. Performed at [facility], [place] [address] [place], [place] [address]   Iron and TIBC     Status: Abnormal   Collection Time: 02/22/22  2:30 AM  Result Value Ref Range   Iron 15 (L) 45 - 182 ug/dL   TIBC 150 (L) 250 - 450 ug/dL   Saturation Ratios 10 (L) 17.9 - 39.5 %   UIBC 135 ug/dL    Comment: Performed at [facility], [place] [address], [place], [place] [address]  Ferritin     Status: Abnormal   Collection Time: 02/22/22  2:30 AM  Result Value Ref Range   Ferritin 1,703 (H) 24 - 336 ng/mL    Comment: Performed at [facility], [place] [address],  [place], [place] [address]  Type and screen [place]     Status: None   Collection Time: 02/22/22  2:30 AM  Result Value Ref Range   ABO/RH(D) O POS    Antibody Screen NEG    Sample Expiration      02/25/2022,2359 Performed at [facility], [place] [address] [place], [place] [address]   HIV Antibody (routine testing w rflx)     Status: None   Collection Time: 02/22/22  2:30 AM  Result Value Ref Range   HIV Screen 4th Generation wRfx Non Reactive Non Reactive    Comment: Performed at [facility], [place] [address], [place], [place] [address]  Procalcitonin - Baseline     Status: None   Collection Time: 02/22/22  2:30 AM  Result Value Ref Range   Procalcitonin 8.80 ng/mL    Comment:        Interpretation: PCT > 2 ng/mL: Systemic infection (sepsis) is likely, unless other causes are known. (NOTE)       Sepsis PCT Algorithm           Lower Respiratory Tract                                      Infection PCT Algorithm    ----------------------------     ----------------------------         PCT < 0.25 ng/mL                PCT < 0.10 ng/mL          Strongly encourage             Strongly discourage   discontinuation of antibiotics    initiation of antibiotics    ----------------------------     -----------------------------       PCT 0.25 - 0.50 ng/mL            PCT 0.10 -  0.25 ng/mL               OR       >80% decrease in PCT            Discourage initiation of                                            antibiotics      Encourage discontinuation           of antibiotics    ----------------------------     -----------------------------         PCT >= 0.50 ng/mL              PCT 0.26 - 0.50 ng/mL               AND       <80% decrease in PCT              Encourage initiation of                                             antibiotics       Encourage continuation           of antibiotics    ----------------------------     -----------------------------         PCT >= 0.50 ng/mL                  PCT > 0.50 ng/mL               AND         increase in PCT                  Strongly encourage                                      initiation of antibiotics    Strongly encourage escalation           of antibiotics                                     -----------------------------                                           PCT <= 0.25 ng/mL                                                 OR                                        > 80% decrease in PCT  Discontinue / Do not initiate                                             antibiotics  Performed at [facility], [place] [address] [place], [place] [address]   APTT     Status: Abnormal   Collection Time: 02/22/22  2:30 AM  Result Value Ref Range   aPTT 42 (H) 24 - 36 seconds    Comment:        IF BASELINE aPTT IS ELEVATED, SUGGEST PATIENT RISK ASSESSMENT BE USED TO DETERMINE APPROPRIATE ANTICOAGULANT THERAPY. Performed at [facility], [place] [address] [place], [place] [address]   Protime-INR     Status: Abnormal   Collection Time: 02/22/22  2:30 AM  Result Value Ref Range   Prothrombin Time 17.1 (H) 11.4 - 15.2 seconds   INR 1.4 (H) 0.8 - 1.2    Comment: (NOTE) INR goal varies based on device and disease states. Performed at [facility], [place] [address], [place], [place] [address]   Comprehensive metabolic panel     Status: Abnormal   Collection Time: 02/22/22  2:30 AM  Result Value Ref Range   Sodium 135 135 - 145 mmol/L   Potassium 3.0 (L) 3.5 - 5.1 mmol/L   Chloride 100 98 - 111 mmol/L   CO2 25 22 - 32 mmol/L   Glucose, Bld 96 70 - 99 mg/dL    Comment: Glucose reference range applies only to samples taken after fasting for at least 8 hours.   BUN 24 (H) 8 - 23 mg/dL   Creatinine, Ser 2.32 (H) 0.61 - 1.24 mg/dL   Calcium 7.9 (L) 8.9 - 10.3 mg/dL   Total Protein 4.8 (L) 6.5 - 8.1 g/dL   Albumin 2.4 (L) 3.5 - 5.0 g/dL   AST 37 15 -  41 U/L   ALT 19 0 - 44 U/L   Alkaline Phosphatase 62 38 - 126 U/L   Total Bilirubin 1.6 (H) 0.3 - 1.2 mg/dL   GFR, Estimated 28 (L) >60 mL/min    Comment: (NOTE) Calculated using the CKD-EPI Creatinine Equation (2021)    Anion gap 10 5 - 15    Comment: Performed at [facility], [place] [address] [place], [place] [address]  Magnesium     Status: None   Collection Time: 02/22/22  2:30 AM  Result Value Ref Range   Magnesium 1.7 1.7 - 2.4 mg/dL    Comment: Performed at [facility] [address] [place], [place] [address]  Folate     Status: None   Collection Time: 02/22/22  2:30 AM  Result Value Ref Range   Folate 20.8 >5.9 ng/mL    Comment: Performed at [facility] [address], [place], [place] [address]  Prealbumin     Status: Abnormal   Collection Time: 02/22/22  2:30 AM  Result Value Ref Range   Prealbumin 7 (L) 18 - 38 mg/dL    Comment: Performed at [facility] [address] [place], [place] [address]  TSH     Status: None   Collection Time: 02/22/22  2:30 AM  Result Value Ref Range   TSH 2.489 0.350 - 4.500 uIU/mL    Comment: Performed by a 3rd Generation assay with a functional sensitivity of <=0.01 uIU/mL. Performed  at [facility], [place] [address], [place], [place] [address]   I-Stat venous blood gas, ED     Status: Abnormal   Collection Time: 02/22/22  2:43 AM  Result Value Ref Range   pH, Ven 7.568 (H) 7.25 - 7.43   pCO2, Ven 27.6 (L) 44 - 60 mmHg   pO2, Ven 20 (LL) 32 - 45 mmHg   Bicarbonate 25.2 20.0 - 28.0 mmol/L   TCO2 26 22 - 32 mmol/L   O2 Saturation 43 %   Acid-Base Excess 3.0 (H) 0.0 - 2.0 mmol/L   Sodium 135 135 - 145 mmol/L   Potassium 2.9 (L) 3.5 - 5.1 mmol/L   Calcium, Ion 1.03 (L) 1.15 - 1.40 mmol/L   HCT 21.0 (L) 39.0 - 52.0 %   Hemoglobin 7.1 (L) 13.0 - 17.0 g/dL   Sample type VENOUS    Comment NOTIFIED PHYSICIAN   Urine Culture     Status: Abnormal   Collection Time: 02/22/22  3:28 AM   Specimen: Urine,  Clean Catch  Result Value Ref Range   Specimen Description URINE, CLEAN CATCH    Special Requests NONE    Culture (A)     <10,000 COLONIES/mL INSIGNIFICANT GROWTH Performed at [facility], [address] [place] [address], [place], [place] [address]    Report Status 02/23/2022 FINAL   Resp Panel by RT-PCR (Flu A&B, Covid) Anterior Nasal Swab     Status: None   Collection Time: 02/22/22  3:28 AM   Specimen: Anterior Nasal Swab  Result Value Ref Range   SARS Coronavirus 2 by RT PCR NEGATIVE NEGATIVE    Comment: (NOTE) SARS-CoV-2 target nucleic acids are NOT DETECTED.  The SARS-CoV-2 RNA is generally detectable in upper respiratory specimens during the acute phase of infection. The lowest concentration of SARS-CoV-2 viral copies this assay can detect is 138 copies/mL. A negative result does not preclude SARS-Cov-2 infection and should not be used as the sole basis for treatment or other patient management decisions. A negative result may occur with  improper specimen collection/handling, submission of specimen other than nasopharyngeal swab, presence of viral mutation(s) within the areas targeted by this assay, and inadequate number of viral copies(<138 copies/mL). A negative result must be combined with clinical observations, patient history, and epidemiological information. The expected result is Negative.  Fact Sheet for Patients:  EntrepreneurPulse.com.au  Fact Sheet for Healthcare Providers:  IncredibleEmployment.be  This test is no t yet approved or cleared by the [place] FDA and  has been authorized for detection and/or diagnosis of SARS-CoV-2 by FDA under an Emergency Use Authorization (EUA). This EUA will remain  in effect (meaning this test can be used) for the duration of the COVID-19 declaration under Section 564(b)(1) of the Act, 21 U.S.C.section 360bbb-3(b)(1), unless the authorization is terminated  or revoked sooner.        Influenza A by PCR NEGATIVE NEGATIVE   Influenza B by PCR NEGATIVE NEGATIVE    Comment: (NOTE) The Xpert Xpress SARS-CoV-2/FLU/RSV plus assay is intended as an aid in the diagnosis of influenza from Nasopharyngeal swab specimens and should not be used as a sole basis for treatment. Nasal washings and aspirates are unacceptable for Xpert Xpress SARS-CoV-2/FLU/RSV testing.  Fact Sheet for Patients: EntrepreneurPulse.com.au  Fact Sheet for Healthcare Providers: IncredibleEmployment.be  This test is not yet approved or cleared by the [place] FDA and has been authorized for detection and/or diagnosis of SARS-CoV-2 by FDA under an Emergency Use Authorization (EUA). This  EUA will remain in effect (meaning this test can be used) for the duration of the COVID-19 declaration under Section 564(b)(1) of the Act, 21 U.S.C. section 360bbb-3(b)(1), unless the authorization is terminated or revoked.  Performed at [facility], [place] [address], [place], [place] [address]   Creatinine, urine, random     Status: None   Collection Time: 02/22/22  3:28 AM  Result Value Ref Range   Creatinine, Urine 105 mg/dL    Comment: Performed at [facility] [address], [place], [place] [address]  Osmolality, urine     Status: None   Collection Time: 02/22/22  3:28 AM  Result Value Ref Range   Osmolality, Ur 302 300 - 900 mOsm/kg    Comment: REPEATED TO VERIFY Performed at [facility], [place] [place], [place] [address]   Sodium, urine, random     Status: None   Collection Time: 02/22/22  3:28 AM  Result Value Ref Range   Sodium, Ur 11 mmol/L    Comment: Performed at [facility], [place] [address] [place], [place] [address]  Strep pneumoniae urinary antigen     Status: None   Collection Time: 02/22/22  3:28 AM  Result Value Ref Range   Strep Pneumo Urinary Antigen NEGATIVE NEGATIVE    Comment:        Infection due to S.  pneumoniae cannot be absolutely ruled out since the antigen present may be below the detection limit of the test. Performed at [facility], [place] [address] [place], [place] [address]   Respiratory (~20 pathogens) panel by PCR     Status: None   Collection Time: 02/22/22  3:28 AM   Specimen: Urine, Clean Catch; Respiratory  Result Value Ref Range   Adenovirus NOT DETECTED NOT DETECTED   Coronavirus 229E NOT DETECTED NOT DETECTED    Comment: (NOTE) The Coronavirus on the Respiratory Panel, DOES NOT test for the novel  Coronavirus (2019 nCoV)    Coronavirus HKU1 NOT DETECTED NOT DETECTED   Coronavirus NL63 NOT DETECTED NOT DETECTED   Coronavirus OC43 NOT DETECTED NOT DETECTED   Metapneumovirus NOT DETECTED NOT DETECTED   Rhinovirus / Enterovirus NOT DETECTED NOT DETECTED   Influenza A NOT DETECTED NOT DETECTED   Influenza B NOT DETECTED NOT DETECTED   Parainfluenza Virus 1 NOT DETECTED NOT DETECTED   Parainfluenza Virus 2 NOT DETECTED NOT DETECTED   Parainfluenza Virus 3 NOT DETECTED NOT DETECTED   Parainfluenza Virus 4 NOT DETECTED NOT DETECTED   Respiratory Syncytial Virus NOT DETECTED NOT DETECTED   Bordetella pertussis NOT DETECTED NOT DETECTED   Bordetella Parapertussis NOT DETECTED NOT DETECTED   Chlamydophila pneumoniae NOT DETECTED NOT DETECTED   Mycoplasma pneumoniae NOT DETECTED NOT DETECTED    Comment: Performed at [facility], [place] [address] [place], [place] [address]  Legionella Pneumophila Serogp 1 Ur Ag     Status: None   Collection Time: 02/22/22  3:28 AM  Result Value Ref Range   L. pneumophila Serogp 1 Ur Ag Negative Negative    Comment: (NOTE) Presumptive negative for L. pneumophila serogroup 1 antigen in urine, suggesting no recent or current infection. Legionnaires' disease cannot be ruled out since other serogroups and species may also cause disease. Performed At: [facility] [place], [place] [id number] Rush Farmer MD KW:[id number]    Source of Sample URINE, RANDOM     Comment: Performed at [facility], [address] [place], [place] [address]  Urinalysis, Complete w Microscopic Urine, Clean Catch     Status: Abnormal   Collection Time: 02/22/22  3:29 AM  Result Value Ref Range   Color, Urine AMBER (A) YELLOW    Comment: BIOCHEMICALS MAY BE AFFECTED BY COLOR   APPearance HAZY (A) CLEAR   Specific Gravity, Urine 1.013 1.005 - 1.030   pH 5.0 5.0 - 8.0   Glucose, UA NEGATIVE NEGATIVE mg/dL   Hgb urine dipstick MODERATE (A) NEGATIVE   Bilirubin Urine NEGATIVE NEGATIVE   Ketones, ur NEGATIVE NEGATIVE mg/dL   Protein, ur 30 (A) NEGATIVE mg/dL   Nitrite NEGATIVE NEGATIVE   Leukocytes,Ua NEGATIVE NEGATIVE   RBC / HPF 11-20 0 - 5 RBC/hpf   WBC, UA 0-5 0 - 5 WBC/hpf   Bacteria, UA RARE (A) NONE SEEN   Squamous Epithelial / LPF 0-5 0 - 5   Mucus PRESENT    Hyaline Casts, UA PRESENT     Comment: Performed at [facility], [address] [place], [place] [address]  MRSA Next Gen by PCR, Nasal     Status: None   Collection Time: 02/22/22  3:33 AM   Specimen: STOOL; Nasal Swab  Result Value Ref Range   MRSA by PCR Next Gen NOT DETECTED NOT DETECTED    Comment: (NOTE) The GeneXpert MRSA Assay (FDA approved for NASAL specimens only), is one component of a comprehensive MRSA colonization surveillance program. It is not intended to diagnose MRSA infection nor to guide or monitor treatment for MRSA infections. Test performance is not FDA approved in patients less than 53 years old. Performed at [facility], [place] [address] [place], [place] [address]   Hepatitis B surface antigen     Status: None   Collection Time: 02/22/22  6:10 AM  Result Value Ref Range   Hepatitis B Surface Ag NON REACTIVE NON REACTIVE    Comment: Performed at [facility] [address], [place], [place] [address]  Hepatitis B surface antibody     Status: None   Collection Time: 02/22/22  6:10  AM  Result Value Ref Range   Hep B S Ab NON REACTIVE NON REACTIVE    Comment: (NOTE) Inconsistent with immunity, less than 10 mIU/mL.  Performed at [facility], [place] [address] [place], [place] [address]   Hepatitis B surface antibody,quantitative     Status: Abnormal   Collection Time: 02/22/22  6:10 AM  Result Value Ref Range   Hep B S AB Quant (Post) 6.7 (L) Immunity>9.9 mIU/mL    Comment: (NOTE)  Status of Immunity                     Anti-HBs Level  ------------------                     -------------- Inconsistent with Immunity                   0.0 - 9.9 Consistent with Immunity                          >9.9 Performed At: [facility] [address] [place], [place] [id number] Rush Farmer MD RF:[id number]   Hepatitis B core antibody, total     Status: None   Collection Time: 02/22/22  6:10 AM  Result Value Ref Range   Hep B Core  Total Ab NON REACTIVE NON REACTIVE    Comment: Performed at [facility], [place] [address], [place], [place] [address]  Hepatitis C antibody     Status: None   Collection Time: 02/22/22  6:10 AM  Result Value Ref Range   HCV Ab NON REACTIVE NON REACTIVE    Comment: (NOTE) Nonreactive HCV antibody screen is consistent with no HCV infections,  unless recent infection is suspected or other evidence exists to indicate HCV infection.  Performed at [facility], [place] [address] [place], [place] [address]   Hepatitis B surface antigen     Status: None   Collection Time: 02/22/22  9:33 AM  Result Value Ref Range   Hepatitis B Surface Ag NON REACTIVE NON REACTIVE    Comment: Performed at [facility] [address] [place], [place] [address]  Procalcitonin     Status: None   Collection Time: 02/23/22  2:43 AM  Result Value Ref Range   Procalcitonin 10.32 ng/mL    Comment:        Interpretation: PCT >= 10 ng/mL: Important systemic inflammatory response, almost exclusively due to severe bacterial sepsis or  septic shock. (NOTE)       Sepsis PCT Algorithm           Lower Respiratory Tract                                      Infection PCT Algorithm    ----------------------------     ----------------------------         PCT < 0.25 ng/mL                PCT < 0.10 ng/mL          Strongly encourage             Strongly discourage   discontinuation of antibiotics    initiation of antibiotics    ----------------------------     -----------------------------       PCT 0.25 - 0.50 ng/mL            PCT 0.10 - 0.25 ng/mL               OR       >80% decrease in PCT            Discourage initiation of                                            antibiotics      Encourage discontinuation           of antibiotics    ----------------------------     -----------------------------         PCT >= 0.50 ng/mL              PCT 0.26 - 0.50 ng/mL                AND       <80% decrease in PCT             Encourage initiation of  antibiotics       Encourage continuation           of antibiotics    ----------------------------     -----------------------------        PCT >= 0.50 ng/mL                  PCT > 0.50 ng/mL               AND         increase in PCT                  Strongly encourage                                      initiation of antibiotics    Strongly encourage escalation           of antibiotics                                     -----------------------------                                           PCT <= 0.25 ng/mL                                                 OR                                        > 80% decrease in PCT                                      Discontinue / Do not initiate                                             antibiotics  Performed at [facility], [address], [place], [place] [address]   Culture, blood (Routine X 2) w Reflex to ID Panel     Status: None (Preliminary result)   Collection Time: 02/23/22   2:43 AM   Specimen: BLOOD  Result Value Ref Range   Specimen Description BLOOD SITE NOT SPECIFIED    Special Requests      BOTTLES DRAWN AEROBIC AND ANAEROBIC Blood Culture results may not be optimal due to an inadequate volume of blood received in culture bottles   Culture      NO GROWTH < 12 HOURS Performed at [facility], [place] [address] [place], [place] [address]    Report Status PENDING   CBC     Status: Abnormal   Collection Time: 02/23/22  2:43 AM  Result Value Ref Range   WBC 7.9 4.0 - 10.5 K/uL   RBC 2.17 (L) 4.22 - 5.81 MIL/uL   Hemoglobin 7.6 (L) 13.0 - 17.0  g/dL   HCT 22.6 (L) 39.0 - 52.0 %   MCV 104.1 (H) 80.0 - 100.0 fL   MCH 35.0 (H) 26.0 - 34.0 pg   MCHC 33.6 30.0 - 36.0 g/dL   RDW 14.8 11.5 - 15.5 %   Platelets 73 (L) 150 - 400 K/uL    Comment: REPEATED TO VERIFY   nRBC 0.0 0.0 - 0.2 %    Comment: Performed at [facility] [address], [place], [place] [address]  Basic metabolic panel     Status: Abnormal   Collection Time: 02/23/22  2:43 AM  Result Value Ref Range   Sodium 138 135 - 145 mmol/L   Potassium 3.6 3.5 - 5.1 mmol/L   Chloride 99 98 - 111 mmol/L   CO2 25 22 - 32 mmol/L   Glucose, Bld 99 70 - 99 mg/dL    Comment: Glucose reference range applies only to samples taken after fasting for at least 8 hours.   BUN 22 8 - 23 mg/dL   Creatinine, Ser 2.17 (H) 0.61 - 1.24 mg/dL   Calcium 8.1 (L) 8.9 - 10.3 mg/dL   GFR, Estimated 30 (L) >60 mL/min    Comment: (NOTE) Calculated using the CKD-EPI Creatinine Equation (2021)    Anion gap 14 5 - 15    Comment: Performed at [facility] [address], [place], [place] [address]  Culture, blood (Routine X 2) w Reflex to ID Panel     Status: None (Preliminary result)   Collection Time: 02/23/22  2:43 AM   Specimen: BLOOD  Result Value Ref Range   Specimen Description BLOOD SITE NOT SPECIFIED    Special Requests      BOTTLES DRAWN AEROBIC AND ANAEROBIC Blood Culture adequate volume    Culture      NO GROWTH < 12 HOURS Performed at [facility], [place] [address] [place], [place] [address]    Report Status PENDING     MR CERVICAL SPINE WO CONTRAST  Result Date: 02/23/2022 CLINICAL DATA:  Rule out discitis osteomyelitis.  MSSA bacteremia. EXAM: MRI CERVICAL SPINE WITHOUT CONTRAST TECHNIQUE: Multiplanar, multisequence MR imaging of the cervical spine was performed. No intravenous contrast was administered. COMPARISON:  MRI thoracic spine 02/22/2022 FINDINGS: Alignment: Image quality degraded by motion Mild retrolisthesis C5-6. Vertebrae: Hemangioma T1 vertebral body.  Negative for fracture Prominent fluid in the disc space at C6-7 with mild edema in the adjacent endplates. Mild prevertebral fluid also present. Cord: Cord evaluation limited given the degree of motion. No cord compression or cord lesion identified. Posterior Fossa, vertebral arteries, paraspinal tissues: Prevertebral edema is present. No paraspinous fluid collection. No mass or adenopathy Disc levels: C2-3: Mild disc and facet degeneration. Mild foraminal narrowing bilaterally. C3-4: Disc and facet degeneration. Mild central canal stenosis and mild foraminal narrowing bilaterally due to spurring C4-5: Disc and facet degeneration. Mild central canal stenosis and mild foraminal narrowing bilaterally due to spurring C5-6: Disc and facet degeneration. Mild spinal stenosis and mild foraminal narrowing bilaterally due to spurring C6-7: Large amount of fluid in the disc space with edema in the adjacent endplates. Fluid appears contiguous with prevertebral fluid. Findings are suspicious for discitis and osteomyelitis. No epidural abscess. Right-sided disc and osteophyte complex flattening the cord. There is moderate right foraminal narrowing and mild left foraminal narrowing. Mild to moderate central canal stenosis. C7-T1: Negative for stenosis. IMPRESSION: 1. Image quality degraded by motion. 2. Large amount of fluid in the  disc space  at C6-7 with edema in the adjacent endplates and mild prevertebral fluid. Findings are suspicious for discitis and osteomyelitis. No epidural abscess. Right-sided disc and osteophyte complex causing cord deformity and moderate right foraminal narrowing. Mild to moderate spinal stenosis. 3. 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 Electronically Signed   By: Franchot Gallo M.D.   On: 02/23/2022 11:26   MR LUMBAR SPINE WO CONTRAST  Result Date: 02/23/2022 CLINICAL DATA:  Initial evaluation for staph bacteremia. EXAM: MRI LUMBAR SPINE WITHOUT CONTRAST TECHNIQUE: Multiplanar, multisequence MR imaging of the lumbar spine was performed. No intravenous contrast was administered. COMPARISON:  Prior CT from 02/21/2022. FINDINGS: Segmentation: Examination is severely degraded by motion artifact, markedly limiting assessment. Standard segmentation. Alignment: Physiologic with preservation of the normal lumbar lordosis. No significant listhesis. Vertebrae: Vertebral body height grossly maintained with no acute or chronic fracture. Bone marrow signal intensity diffusely heterogeneous. No worrisome osseous lesions. Fluid signal intensity with adjacent mild reactive endplate changes noted at the T12-L1, L1-2, L2-3, L3-4, and L4-5 interspaces. While these findings are in large part suspected to be degenerative, possible infection with osteomyelitis discitis is difficult to exclude on this severely motion degraded exam. Question of mild paraspinous edema within the adjacent psoas musculature. Conus medullaris and cauda equina: Conus extends to the L1 level. Conus and cauda equina are grossly within normal limits. No visible epidural collections, although evaluation is fairly limited. Paraspinal and other soft tissues: Question mild paraspinous edema within the psoas musculature bilaterally. No visible collections. Disc levels: T12-L1: Disc bulge with endplate  spurring. Left greater than right facet hypertrophy. No significant spinal stenosis. Foramina appear grossly patent. L1-2: Intervertebral disc space narrowing with diffuse disc bulge and reactive endplate change. Moderate facet hypertrophy. At least moderate spinal stenosis. Foramina appear grossly patent. L2-3: Degenerative intervertebral disc space narrowing with diffuse disc bulge and reactive endplate change. Moderate facet hypertrophy. Resultant severe spinal stenosis. Moderate right L2 foraminal narrowing. Left neural foramen remains patent. L3-4: Degenerative intervertebral disc space narrowing with diffuse disc bulge and endplate spurring. Moderate facet arthrosis. Resultant severe spinal stenosis. Foramina appear grossly patent. L4-5: Degenerative intervertebral disc space narrowing with diffuse disc bulge and reactive endplate spurring. Moderate facet hypertrophy. Moderate canal with severe left and moderate right lateral recess stenosis. Mild to moderate left L4 foraminal narrowing. Right neural foramen remains patent. L5-S1: Advanced degenerative intervertebral disc space narrowing with diffuse disc bulge and reactive endplate spurring. Moderate facet hypertrophy. Moderate left greater than right lateral recess stenosis. Central canal remains patent. No significant foraminal stenosis. IMPRESSION: 1. Severely motion degraded exam, markedly limiting assessment. 2. Fluid signal intensity with adjacent mild reactive endplate changes at the T12-L1, L1-2, L2-3, L3-4, and L4-5 interspaces. While these findings could be degenerative in nature, possible infection with osteomyelitis discitis is difficult to exclude on this severely motion degraded exam. Correlation with symptomatology and laboratory values recommended. 3. 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 Electronically Signed   By: Jeannine Boga M.D.   On: 02/23/2022 01:29   MR THORACIC SPINE WO CONTRAST  Result Date: 02/23/2022 CLINICAL DATA:  Initial evaluation for staph bacteremia. EXAM: MRI THORACIC SPINE WITHOUT CONTRAST TECHNIQUE: Multiplanar, multisequence MR imaging of the thoracic spine was performed. No intravenous contrast was administered. COMPARISON:  Prior CT from 02/21/2022. FINDINGS: Alignment: Examination is severely degraded by motion artifact, markedly limiting assessment. Mild exaggeration of the normal thoracic kyphosis.  No listhesis. Vertebrae: Vertebral body height grossly maintained without acute or chronic fracture. Bone marrow signal intensity diffusely heterogeneous. 1 cm T1/T2 hyperintense lesion within the T1 vertebral body, likely a hemangioma. No definite worrisome osseous lesions. Fluid signal intensity with mild disc space widening and reactive endplate edema noted about the partially visualized C6-7 interspace (series 6, image 10). Suggestion of mild paraspinous edema within this region. While these findings could be degenerative, possible osteomyelitis discitis could also have this appearance. Otherwise, no other convincing evidence for acute infection elsewhere within the thoracic spine on this motion degraded exam. Cord: Grossly normal signal and morphology. No visible epidural collections. Paraspinal and other soft tissues: Probable mild paraspinous edema adjacent to the C6-7 interspace. No other visible paraspinous soft tissue abnormality. Layering bilateral pleural effusions with associated atelectasis and/or consolidation. Disc levels: Multilevel disc desiccation with mild noncompressive disc bulging and reactive endplate spurring seen throughout the thoracic spine. No significant or high-grade spinal stenosis. Foramina appear grossly patent. IMPRESSION: 1. Severely motion degraded exam. 2. Fluid signal intensity with reactive endplate in paraspinous edema about the partially visualized C6-7  interspace. While these findings could be degenerative in nature, possible osteomyelitis discitis could also have this appearance. Correlation with symptomatology and laboratory values recommended. Further evaluation with dedicated MRI of the cervical spine could be performed for further evaluation as the patient is able to tolerate. 3. No other convincing evidence for acute infection within the thoracic spine. 4. Layering bilateral pleural effusions with associated atelectasis and/or consolidation. Electronically Signed   By: Jeannine Boga M.D.   On: 02/23/2022 01:20   ECHOCARDIOGRAM COMPLETE  Result Date: 02/22/2022    ECHOCARDIOGRAM REPORT   Patient Name:   JOSEMARIA BRINING Date of Exam: 02/22/2022 Medical Rec #:  [id number]        Height:       72.0 in Accession #:    [id number]       Weight:       171.0 lb Date of Birth:  1940/07/13        BSA:          1.993 m Patient Age:    69 years         BP:           110/43 mmHg Patient Gender: M                HR:           124 bpm. Exam Location:  Inpatient Procedure: 2D Echo, Cardiac Doppler and Color Doppler Indications:    elevated trops  History:        Patient has prior history of Echocardiogram examinations, most                 recent 01/17/2021. CAD, Arrythmias:Bradycardia; Risk                 Factors:Hypertension and Dyslipidemia.  Sonographer:    Melissa Morford RDCS (AE, PE) Referring Phys: [place]  1. Left ventricular ejection fraction, by estimation, is 60 to 65%. The left ventricle has normal function. The left ventricle has no regional wall motion abnormalities. Left ventricular diastolic parameters are consistent with Grade I diastolic dysfunction (impaired relaxation). Elevated left ventricular end-diastolic pressure. The E/e' is 73.  2. Right ventricular systolic function is normal. The right ventricular size is normal. There is normal pulmonary artery systolic pressure. The estimated right ventricular systolic  pressure is 67.3 mmHg.  3. Left atrial size  was moderately dilated.  4. The mitral valve is abnormal. Mild mitral valve regurgitation. Moderate mitral annular calcification.  5. The aortic valve is tricuspid. Aortic valve regurgitation is not visualized. Mild aortic valve stenosis. Aortic valve area, by VTI measures 2.45 cm. Aortic valve mean gradient measures 12.5 mmHg. Aortic valve Vmax measures 2.18 m/s.  6. Aortic dilatation noted. There is borderline dilatation of the ascending aorta, measuring 39 mm.  7. The inferior vena cava is dilated in size with <50% respiratory variability, suggesting right atrial pressure of 15 mmHg.  8. Cannot exclude a small PFO. Comparison(s): Changes from prior study are noted. 01/17/2021: LVEF 60-65%, mild AS - mean gradient 12 mmHg. FINDINGS  Left Ventricle: Left ventricular ejection fraction, by estimation, is 60 to 65%. The left ventricle has normal function. The left ventricle has no regional wall motion abnormalities. The left ventricular internal cavity size was normal in size. There is  no left ventricular hypertrophy. Left ventricular diastolic parameters are consistent with Grade I diastolic dysfunction (impaired relaxation). Elevated left ventricular end-diastolic pressure. The E/e' is 15. Right Ventricle: The right ventricular size is normal. No increase in right ventricular wall thickness. Right ventricular systolic function is normal. There is normal pulmonary artery systolic pressure. The tricuspid regurgitant velocity is 1.63 m/s, and  with an assumed right atrial pressure of 15 mmHg, the estimated right ventricular systolic pressure is 96.7 mmHg. Left Atrium: Left atrial size was moderately dilated. Right Atrium: Right atrial size was normal in size. Pericardium: There is no evidence of pericardial effusion. Mitral Valve: The mitral valve is abnormal. There is moderate calcification of the posterior mitral valve leaflet(s). Moderate mitral annular calcification.  Mild mitral valve regurgitation. Tricuspid Valve: The tricuspid valve is grossly normal. Tricuspid valve regurgitation is trivial. Aortic Valve: The aortic valve is tricuspid. Aortic valve regurgitation is not visualized. Mild aortic stenosis is present. Aortic valve mean gradient measures 12.5 mmHg. Aortic valve peak gradient measures 19.0 mmHg. Aortic valve area, by VTI measures 2.45 cm. Pulmonic Valve: The pulmonic valve was grossly normal. Pulmonic valve regurgitation is trivial. Aorta: Aortic dilatation noted. There is borderline dilatation of the ascending aorta, measuring 39 mm. Venous: The inferior vena cava is dilated in size with less than 50% respiratory variability, suggesting right atrial pressure of 15 mmHg. IAS/Shunts: Cannot exclude a small PFO.  LEFT VENTRICLE PLAX 2D LVIDd:         5.33 cm      Diastology LVIDs:         4.13 cm      LV e' medial:    8.92 cm/s LV PW:         0.93 cm      LV E/e' medial:  13.9 LV IVS:        0.87 cm      LV e' lateral:   6.64 cm/s LVOT diam:     2.10 cm      LV E/e' lateral: 18.7 LV SV:         108 LV SV Index:   54 LVOT Area:     3.46 cm  LV Volumes (MOD) LV vol d, MOD A2C: 143.0 ml LV vol d, MOD A4C: 162.0 ml LV vol s, MOD A2C: 70.6 ml LV vol s, MOD A4C: 65.8 ml LV SV MOD A2C:     72.4 ml LV SV MOD A4C:     162.0 ml LV SV MOD BP:      88.1 ml RIGHT VENTRICLE RV S  prime:     11.30 cm/s TAPSE (M-mode): 2.6 cm LEFT ATRIUM             Index        RIGHT ATRIUM           Index LA diam:        4.20 cm 2.11 cm/m   RA Area:     14.50 cm LA Vol (A2C):   77.0 ml 38.63 ml/m  RA Volume:   34.20 ml  17.16 ml/m LA Vol (A4C):   87.3 ml 43.80 ml/m LA Biplane Vol: 87.7 ml 44.00 ml/m  AORTIC VALVE AV Area (Vmax):    2.38 cm AV Area (Vmean):   2.31 cm AV Area (VTI):     2.45 cm AV Vmax:           218.00 cm/s AV Vmean:          167.000 cm/s AV VTI:            0.440 m AV Peak Grad:      19.0 mmHg AV Mean Grad:      12.5 mmHg LVOT Vmax:         150.00 cm/s LVOT Vmean:         111.500 cm/s LVOT VTI:          0.310 m LVOT/AV VTI ratio: 0.71  AORTA Ao Root diam: 3.60 cm Ao Asc diam:  3.90 cm MITRAL VALVE                TRICUSPID VALVE MV Area (PHT): 4.80 cm     TR Peak grad:   10.6 mmHg MV Decel Time: 158 msec     TR Vmax:        163.00 cm/s MV E velocity: 124.00 cm/s MV A velocity: 121.00 cm/s  SHUNTS MV E/A ratio:  1.02         Systemic VTI:  0.31 m                             Systemic Diam: 2.10 cm Lyman Bishop MD Electronically signed by Lyman Bishop MD Signature Date/Time: 02/22/2022/11:01:26 AM    Final    DG Foot 2 Views Left  Result Date: 02/22/2022 CLINICAL DATA:  Cellulitis EXAM: LEFT FOOT - 2 VIEW COMPARISON:  Radiographs 11/02/2021; MRI 06/08/2021; radiographs 06/07/2021 FINDINGS: 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 Osseous destruction at the base of the second proximal phalanx in the head of the second metatarsal consistent with osteomyelitis. Question osteo myelitis of the medial aspect of the fourth metatarsal head versus projection artifact. Remote posttraumatic deformity about the head of the third metatarsal. 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 Postsurgical changes about the ankle. 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 Question osteomyelitis of the medial aspect of the fourth metatarsal head. Electronically Signed   By: Placido Sou M.D.   On: 02/22/2022 02:04   DG Chest 1 View  Result Date: 02/21/2022 CLINICAL DATA:  Weakness EXAM: CHEST  1 VIEW COMPARISON:  02/20/2022 FINDINGS: Stable cardiomediastinal contours. Slightly low lung volumes with bibasilar atelectasis. Trace effusions. No pneumothorax. Advanced degenerative changes of the left glenohumeral joint. IMPRESSION: Bibasilar atelectasis and trace bilateral pleural effusions. Electronically Signed   By: Davina Poke D.O.   On: 02/21/2022 20:42    Review of Systems  Constitutional:  Positive for activity change and fever.  HENT:  Negative.  Eyes: Negative.   Respiratory: Negative.    Cardiovascular: Negative.   Gastrointestinal: Negative.   Genitourinary:        Esrd  Musculoskeletal:  Positive for back pain and neck pain.  Skin: Negative.   Hematological:  Bruises/bleeds easily.  Psychiatric/Behavioral:  Positive for confusion.    Blood pressure 134/71, pulse 82, temperature 98.7 F (37.1 C), resp. rate 19, height 6' (1.829 m), weight 77.6 kg, SpO2 97 %. Physical Exam Constitutional:      Appearance: Normal appearance.  HENT:     Head: Normocephalic.     Right Ear: External ear normal.     Left Ear: External ear normal.     Mouth/Throat:     Mouth: Mucous membranes are moist.  Eyes:     Extraocular Movements: Extraocular movements intact.     Conjunctiva/sclera: Conjunctivae normal.     Pupils: Pupils are equal, round, and reactive to light.  Pulmonary:     Effort: Pulmonary effort is normal.  Abdominal:     General: Abdomen is flat.  Musculoskeletal:        General: Normal range of motion.  Skin:    General: Skin is warm and dry.  Neurological:     Mental Status: He is alert. Mental status is at baseline. He is confused.     Cranial Nerves: Cranial nerves 2-12 are intact.     Sensory: Sensation is intact.     Motor: Motor function is intact.     Coordination: Coordination is intact.     Comments: Did not assess gait as patient is in the bed.  Moving all extremities well     Assessment/Plan: Darryl Diaz is a 81 y.o. male With a C1 fracture, and currently with sepsis, secondary to Staph aureus bacteremia. MRI cervical spine shows fluid at the C6/7 disc space. There is no cord compression nor epidural process. There are no surgical indications at this time. No real motor deficits. Should remain in the cervical collar for the C1 fracture.  Ashok Pall 02/23/2022, 5:06 PM

## 2022-02-23 NOTE — Progress Notes (Signed)
Speech Language Pathology Treatment: Dysphagia  Patient Details Name: Darryl Diaz MRN: [id number] DOB: Sep 21, 1940 Today's Date: 02/23/2022 Time: 1645-1700 SLP Time Calculation (min) (ACUTE ONLY): 15 min  Assessment / Plan / Recommendation Clinical Impression  Patient seen by SLP for skilled treatment focused on dysphagia goals. Patient was awake and alert, singing when SLP entered room. He was initially slow to acknowledge SLP's presence but he then ceased his singing and attended to SLP. During oral care, some dried secretions removed when patient swishing and spitting with water. He continues to be confused and has difficulty transitioning and so when SLP then gave him water and told him "this is for drinking", he started to swish in mouth before swallowing, resulting in delayed initiation of swallow and cough response. He did this again with graham cracker, masticating adequately but then holding in his mouth and when SLP told him to swallow it, he said, "you didn't tell me to". Of note, patient did allow SLP to elevate HOB adequately without c/o pain which is a change from previous date. His confusion is still impacting his ability to safely and adequately take PO's and so he will continue to need supervision and assistance with meals. SLP will continue to follow to ensure diet toleration.    HPI HPI: Pt is an 81 y/o M presented from SNF for decreased PO Intake, swelling and redness of the left foot.  Recent admit 9/15 after fall at home with C33f, continues to wear C-collar. PMH includes alcohol abuse, ESRD on HD MWF, CHF, HTN, and HLD.      SLP Plan  Continue with current plan of care      Recommendations for follow up therapy are one component of a multi-disciplinary discharge planning process, led by the attending physician.  Recommendations may be updated based on patient status, additional functional criteria and insurance authorization.    Recommendations  Diet  recommendations: Dysphagia 3 (mechanical soft);Thin liquid Liquids provided via: Cup;Straw Medication Administration: Whole meds with liquid Supervision: Full supervision/cueing for compensatory strategies;Staff to assist with self feeding Compensations: Slow rate;Small sips/bites;Minimize environmental distractions Postural Changes and/or Swallow Maneuvers: Seated upright 90 degrees                Oral Care Recommendations: Oral care BID;Staff/trained caregiver to provide oral care Follow Up Recommendations: Skilled nursing-short term rehab (<3 hours/day) Assistance recommended at discharge: Frequent or constant Supervision/Assistance SLP Visit Diagnosis: Dysphagia, unspecified (R13.10) Plan: Continue with current plan of care          JSonia Baller MA, CCC-SLP Speech Therapy

## 2022-02-23 NOTE — Progress Notes (Addendum)
Rounding Note    Patient Name: Darryl Diaz Date of Encounter: 02/23/2022  [place] Cardiologist: Sherren Mocha, MD   Subjective   Feels well.  Reports back pain.  Doesn't understand why he has mitts on. He was reportedly pulling off his C-collar overnight.   Inpatient Medications    Scheduled Meds:  amiodarone  200 mg Oral q morning   Chlorhexidine Gluconate Cloth  6 each Topical Q0600   escitalopram  10 mg Oral q morning   lidocaine  3 patch Transdermal Q24H   sodium chloride flush  3 mL Intravenous Q12H   tamsulosin  0.4 mg Oral QHS   Continuous Infusions:  sodium chloride     anticoagulant sodium citrate      ceFAZolin (ANCEF) IV Stopped (02/22/22 1840)   PRN Meds: sodium chloride, acetaminophen **OR** acetaminophen, alteplase, anticoagulant sodium citrate, heparin, HYDROcodone-acetaminophen, lidocaine (PF), lidocaine-prilocaine, LORazepam, pentafluoroprop-tetrafluoroeth, sodium chloride flush   Vital Signs    Vitals:   02/23/22 0500 02/23/22 0530 02/23/22 0600 02/23/22 0630  BP: (!) 120/44 (!) 102/53 (!) 108/53 117/62  Pulse: 88 85 (!) 162 81  Resp: (!) 26 (!) 26 (!) 22 (!) 32  Temp:   97.6 F (36.4 C)   TempSrc:   Oral   SpO2: 93% 92% 90% 93%  Weight:      Height:        Intake/Output Summary (Last 24 hours) at 02/23/2022 0836 Last data filed at 02/22/2022 1403 Gross per 24 hour  Intake 120 ml  Output 0.8 ml  Net 119.2 ml      02/21/2022    7:59 PM 01/26/2022    1:54 PM  Last 3 Weights  Weight (lbs) 171 lb 171 lb 11.8 oz  Weight (kg) 77.565 kg 77.9 kg      Telemetry    Sinus rhythm.  PVCs.   - Personally Reviewed  ECG    N/a - Personally Reviewed  Physical Exam   VS:  BP (!) 121/50   Pulse 74   Temp 97.6 F (36.4 C) (Oral)   Resp (!) 25   Ht 6' (1.829 m)   Wt 77.6 kg   SpO2 95%   BMI 23.19 kg/m  , BMI Body mass index is 23.19 kg/m. GENERAL:  Chronically ill-appearing.   HEENT: Pupils equal round and  reactive, fundi not visualized, oral mucosa unremarkable NECK: C-collar in place LUNGS:  Clear to auscultation bilaterally HEART:  RRR.  PMI not displaced or sustained,S1 and S2 within normal limits, no S3, no S4, no clicks, no rubs, no murmurs ABD:  Flat, positive bowel sounds normal in frequency in pitch, no bruits, no rebound, no guarding, no midline pulsatile mass, no hepatomegaly, no splenomegaly EXT:  2 plus pulses throughout, no edema, no cyanosis no clubbing SKIN:  No rashes no nodules NEURO:  Cranial nerves II through XII grossly intact, motor grossly intact throughout PSYCH:  Cognitively intact, oriented to person place and time   Labs    High Sensitivity Troponin:   Recent Labs  Lab 02/21/22 2009 02/21/22 2300 02/22/22 0230  TROPONINIHS 375* 346* 524*     Chemistry Recent Labs  Lab 02/20/22 2308 02/21/22 2009 02/22/22 0230 02/22/22 0243 02/23/22 0243  NA 129* 137 135 135 138  K 3.5 3.2* 3.0* 2.9* 3.6  CL 94* 98 100  --  99  CO2 20* 25 25  --  25  GLUCOSE 123* 92 96  --  99  BUN 50* 20 24*  --  22  CREATININE 3.69* 2.05* 2.32*  --  2.17*  CALCIUM 8.2* 8.2* 7.9*  --  8.1*  MG 1.4* 1.7 1.7  --   --   PROT 5.5* 5.2* 4.8*  --   --   ALBUMIN 3.0* 2.6* 2.4*  --   --   AST 28 40 37  --   --   ALT '15 17 19  '$ --   --   ALKPHOS 103 76 62  --   --   BILITOT 1.4* 1.6* 1.6*  --   --   GFRNONAA 16* 32* 28*  --  30*  ANIONGAP '15 14 10  '$ --  14    Lipids No results for input(s): "CHOL", "TRIG", "HDL", "LABVLDL", "LDLCALC", "CHOLHDL" in the last 168 hours.  Hematology Recent Labs  Lab 02/20/22 2308 02/21/22 2009 02/22/22 0243 02/23/22 0243  WBC 5.7 7.9  --  7.9  RBC 2.60* 2.37*  --  2.17*  HGB 9.3* 8.4* 7.1* 7.6*  HCT 26.4* 23.9* 21.0* 22.6*  MCV 101.5* 100.8*  --  104.1*  MCH 35.8* 35.4*  --  35.0*  MCHC 35.2 35.1  --  33.6  RDW 14.8 14.6  --  14.8  PLT 93* 83*  --  73*   Thyroid  Recent Labs  Lab 02/22/22 0230  TSH 2.489    BNPNo results for input(s):  "BNP", "PROBNP" in the last 168 hours.  DDimer No results for input(s): "DDIMER" in the last 168 hours.   Radiology    MR LUMBAR SPINE WO CONTRAST  Result Date: 02/23/2022 CLINICAL DATA:  Initial evaluation for staph bacteremia. EXAM: MRI LUMBAR SPINE WITHOUT CONTRAST TECHNIQUE: Multiplanar, multisequence MR imaging of the lumbar spine was performed. No intravenous contrast was administered. COMPARISON:  Prior CT from 02/21/2022. FINDINGS: Segmentation: Examination is severely degraded by motion artifact, markedly limiting assessment. Standard segmentation. Alignment: Physiologic with preservation of the normal lumbar lordosis. No significant listhesis. Vertebrae: Vertebral body height grossly maintained with no acute or chronic fracture. Bone marrow signal intensity diffusely heterogeneous. No worrisome osseous lesions. Fluid signal intensity with adjacent mild reactive endplate changes noted at the T12-L1, L1-2, L2-3, L3-4, and L4-5 interspaces. While these findings are in large part suspected to be degenerative, possible infection with osteomyelitis discitis is difficult to exclude on this severely motion degraded exam. Question of mild paraspinous edema within the adjacent psoas musculature. Conus medullaris and cauda equina: Conus extends to the L1 level. Conus and cauda equina are grossly within normal limits. No visible epidural collections, although evaluation is fairly limited. Paraspinal and other soft tissues: Question mild paraspinous edema within the psoas musculature bilaterally. No visible collections. Disc levels: T12-L1: Disc bulge with endplate spurring. Left greater than right facet hypertrophy. No significant spinal stenosis. Foramina appear grossly patent. L1-2: Intervertebral disc space narrowing with diffuse disc bulge and reactive endplate change. Moderate facet hypertrophy. At least moderate spinal stenosis. Foramina appear grossly patent. L2-3: Degenerative intervertebral disc space  narrowing with diffuse disc bulge and reactive endplate change. Moderate facet hypertrophy. Resultant severe spinal stenosis. Moderate right L2 foraminal narrowing. Left neural foramen remains patent. L3-4: Degenerative intervertebral disc space narrowing with diffuse disc bulge and endplate spurring. Moderate facet arthrosis. Resultant severe spinal stenosis. Foramina appear grossly patent. L4-5: Degenerative intervertebral disc space narrowing with diffuse disc bulge and reactive endplate spurring. Moderate facet hypertrophy. Moderate canal with severe left and moderate right lateral recess stenosis. Mild to moderate left L4 foraminal narrowing. Right neural foramen remains patent. L5-S1: Advanced  degenerative intervertebral disc space narrowing with diffuse disc bulge and reactive endplate spurring. Moderate facet hypertrophy. Moderate left greater than right lateral recess stenosis. Central canal remains patent. No significant foraminal stenosis. IMPRESSION: 1. Severely motion degraded exam, markedly limiting assessment. 2. Fluid signal intensity with adjacent mild reactive endplate changes at the T12-L1, L1-2, L2-3, L3-4, and L4-5 interspaces. While these findings could be degenerative in nature, possible infection with osteomyelitis discitis is difficult to exclude on this severely motion degraded exam. Correlation with symptomatology and laboratory values recommended. 3. 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 Electronically Signed   By: Jeannine Boga M.D.   On: 02/23/2022 01:29   MR THORACIC SPINE WO CONTRAST  Result Date: 02/23/2022 CLINICAL DATA:  Initial evaluation for staph bacteremia. EXAM: MRI THORACIC SPINE WITHOUT CONTRAST TECHNIQUE: Multiplanar, multisequence MR imaging of the thoracic spine was performed. No intravenous contrast was administered. COMPARISON:  Prior CT from  02/21/2022. FINDINGS: Alignment: Examination is severely degraded by motion artifact, markedly limiting assessment. Mild exaggeration of the normal thoracic kyphosis.  No listhesis. Vertebrae: Vertebral body height grossly maintained without acute or chronic fracture. Bone marrow signal intensity diffusely heterogeneous. 1 cm T1/T2 hyperintense lesion within the T1 vertebral body, likely a hemangioma. No definite worrisome osseous lesions. Fluid signal intensity with mild disc space widening and reactive endplate edema noted about the partially visualized C6-7 interspace (series 6, image 10). Suggestion of mild paraspinous edema within this region. While these findings could be degenerative, possible osteomyelitis discitis could also have this appearance. Otherwise, no other convincing evidence for acute infection elsewhere within the thoracic spine on this motion degraded exam. Cord: Grossly normal signal and morphology. No visible epidural collections. Paraspinal and other soft tissues: Probable mild paraspinous edema adjacent to the C6-7 interspace. No other visible paraspinous soft tissue abnormality. Layering bilateral pleural effusions with associated atelectasis and/or consolidation. Disc levels: Multilevel disc desiccation with mild noncompressive disc bulging and reactive endplate spurring seen throughout the thoracic spine. No significant or high-grade spinal stenosis. Foramina appear grossly patent. IMPRESSION: 1. Severely motion degraded exam. 2. Fluid signal intensity with reactive endplate in paraspinous edema about the partially visualized C6-7 interspace. While these findings could be degenerative in nature, possible osteomyelitis discitis could also have this appearance. Correlation with symptomatology and laboratory values recommended. Further evaluation with dedicated MRI of the cervical spine could be performed for further evaluation as the patient is able to tolerate. 3. No other convincing  evidence for acute infection within the thoracic spine. 4. Layering bilateral pleural effusions with associated atelectasis and/or consolidation. Electronically Signed   By: Jeannine Boga M.D.   On: 02/23/2022 01:20   ECHOCARDIOGRAM COMPLETE  Result Date: 02/22/2022    ECHOCARDIOGRAM REPORT   Patient Name:   Darryl Diaz Date of Exam: 02/22/2022 Medical Rec #:  [id number]        Height:       72.0 in Accession #:    [id number]       Weight:       171.0 lb Date of Birth:  [birth year]        BSA:          1.993 m Patient Age:    81 years         BP:           110/43 mmHg Patient Gender: M  HR:           124 bpm. Exam Location:  Inpatient Procedure: 2D Echo, Cardiac Doppler and Color Doppler Indications:    elevated trops  History:        Patient has prior history of Echocardiogram examinations, most                 recent 01/17/2021. CAD, Arrythmias:Bradycardia; Risk                 Factors:Hypertension and Dyslipidemia.  Sonographer:    Melissa Morford RDCS (AE, PE) Referring Phys: [place]  1. Left ventricular ejection fraction, by estimation, is 60 to 65%. The left ventricle has normal function. The left ventricle has no regional wall motion abnormalities. Left ventricular diastolic parameters are consistent with Grade I diastolic dysfunction (impaired relaxation). Elevated left ventricular end-diastolic pressure. The E/e' is 90.  2. Right ventricular systolic function is normal. The right ventricular size is normal. There is normal pulmonary artery systolic pressure. The estimated right ventricular systolic pressure is 02.6 mmHg.  3. Left atrial size was moderately dilated.  4. The mitral valve is abnormal. Mild mitral valve regurgitation. Moderate mitral annular calcification.  5. The aortic valve is tricuspid. Aortic valve regurgitation is not visualized. Mild aortic valve stenosis. Aortic valve area, by VTI measures 2.45 cm. Aortic valve mean gradient  measures 12.5 mmHg. Aortic valve Vmax measures 2.18 m/s.  6. Aortic dilatation noted. There is borderline dilatation of the ascending aorta, measuring 39 mm.  7. The inferior vena cava is dilated in size with <50% respiratory variability, suggesting right atrial pressure of 15 mmHg.  8. Cannot exclude a small PFO. Comparison(s): Changes from prior study are noted. 01/17/2021: LVEF 60-65%, mild AS - mean gradient 12 mmHg. FINDINGS  Left Ventricle: Left ventricular ejection fraction, by estimation, is 60 to 65%. The left ventricle has normal function. The left ventricle has no regional wall motion abnormalities. The left ventricular internal cavity size was normal in size. There is  no left ventricular hypertrophy. Left ventricular diastolic parameters are consistent with Grade I diastolic dysfunction (impaired relaxation). Elevated left ventricular end-diastolic pressure. The E/e' is 15. Right Ventricle: The right ventricular size is normal. No increase in right ventricular wall thickness. Right ventricular systolic function is normal. There is normal pulmonary artery systolic pressure. The tricuspid regurgitant velocity is 1.63 m/s, and  with an assumed right atrial pressure of 15 mmHg, the estimated right ventricular systolic pressure is 37.8 mmHg. Left Atrium: Left atrial size was moderately dilated. Right Atrium: Right atrial size was normal in size. Pericardium: There is no evidence of pericardial effusion. Mitral Valve: The mitral valve is abnormal. There is moderate calcification of the posterior mitral valve leaflet(s). Moderate mitral annular calcification. Mild mitral valve regurgitation. Tricuspid Valve: The tricuspid valve is grossly normal. Tricuspid valve regurgitation is trivial. Aortic Valve: The aortic valve is tricuspid. Aortic valve regurgitation is not visualized. Mild aortic stenosis is present. Aortic valve mean gradient measures 12.5 mmHg. Aortic valve peak gradient measures 19.0 mmHg. Aortic  valve area, by VTI measures 2.45 cm. Pulmonic Valve: The pulmonic valve was grossly normal. Pulmonic valve regurgitation is trivial. Aorta: Aortic dilatation noted. There is borderline dilatation of the ascending aorta, measuring 39 mm. Venous: The inferior vena cava is dilated in size with less than 50% respiratory variability, suggesting right atrial pressure of 15 mmHg. IAS/Shunts: Cannot exclude a small PFO.  LEFT VENTRICLE PLAX 2D LVIDd:  5.33 cm      Diastology LVIDs:         4.13 cm      LV e' medial:    8.92 cm/s LV PW:         0.93 cm      LV E/e' medial:  13.9 LV IVS:        0.87 cm      LV e' lateral:   6.64 cm/s LVOT diam:     2.10 cm      LV E/e' lateral: 18.7 LV SV:         108 LV SV Index:   54 LVOT Area:     3.46 cm  LV Volumes (MOD) LV vol d, MOD A2C: 143.0 ml LV vol d, MOD A4C: 162.0 ml LV vol s, MOD A2C: 70.6 ml LV vol s, MOD A4C: 65.8 ml LV SV MOD A2C:     72.4 ml LV SV MOD A4C:     162.0 ml LV SV MOD BP:      88.1 ml RIGHT VENTRICLE RV S prime:     11.30 cm/s TAPSE (M-mode): 2.6 cm LEFT ATRIUM             Index        RIGHT ATRIUM           Index LA diam:        4.20 cm 2.11 cm/m   RA Area:     14.50 cm LA Vol (A2C):   77.0 ml 38.63 ml/m  RA Volume:   34.20 ml  17.16 ml/m LA Vol (A4C):   87.3 ml 43.80 ml/m LA Biplane Vol: 87.7 ml 44.00 ml/m  AORTIC VALVE AV Area (Vmax):    2.38 cm AV Area (Vmean):   2.31 cm AV Area (VTI):     2.45 cm AV Vmax:           218.00 cm/s AV Vmean:          167.000 cm/s AV VTI:            0.440 m AV Peak Grad:      19.0 mmHg AV Mean Grad:      12.5 mmHg LVOT Vmax:         150.00 cm/s LVOT Vmean:        111.500 cm/s LVOT VTI:          0.310 m LVOT/AV VTI ratio: 0.71  AORTA Ao Root diam: 3.60 cm Ao Asc diam:  3.90 cm MITRAL VALVE                TRICUSPID VALVE MV Area (PHT): 4.80 cm     TR Peak grad:   10.6 mmHg MV Decel Time: 158 msec     TR Vmax:        163.00 cm/s MV E velocity: 124.00 cm/s MV A velocity: 121.00 cm/s  SHUNTS MV E/A ratio:  1.02          Systemic VTI:  0.31 m                             Systemic Diam: 2.10 cm Lyman Bishop MD Electronically signed by Lyman Bishop MD Signature Date/Time: 02/22/2022/11:01:26 AM    Final    DG Foot 2 Views Left  Result Date: 02/22/2022 CLINICAL DATA:  Cellulitis EXAM: LEFT FOOT - 2 VIEW COMPARISON:  Radiographs 11/02/2021; MRI 06/08/2021; radiographs 06/07/2021 FINDINGS: 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 Osseous destruction at the base of the second proximal phalanx in the head of the second metatarsal consistent with osteomyelitis. Question osteo myelitis of the medial aspect of the fourth metatarsal head versus projection artifact. Remote posttraumatic deformity about the head of the third metatarsal. 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 Postsurgical changes about the ankle. 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 Question osteomyelitis of the medial aspect of the fourth metatarsal head. Electronically Signed   By: Placido Sou M.D.   On: 02/22/2022 02:04   DG Chest 1 View  Result Date: 02/21/2022 CLINICAL DATA:  Weakness EXAM: CHEST  1 VIEW COMPARISON:  02/20/2022 FINDINGS: Stable cardiomediastinal contours. Slightly low lung volumes with bibasilar atelectasis. Trace effusions. No pneumothorax. Advanced degenerative changes of the left glenohumeral joint. IMPRESSION: Bibasilar atelectasis and trace bilateral pleural effusions. Electronically Signed   By: Davina Poke D.O.   On: 02/21/2022 20:42    Cardiac Studies   Echo 02/22/22:  1. Left ventricular ejection fraction, by estimation, is 60 to 65%. The  left ventricle has normal function. The left ventricle has no regional  wall motion abnormalities. Left ventricular diastolic parameters are  consistent with Grade I diastolic  dysfunction (impaired relaxation). Elevated left ventricular end-diastolic  pressure. The E/e' is 75.   2. Right ventricular systolic function is normal.  The right ventricular  size is normal. There is normal pulmonary artery systolic pressure. The  estimated right ventricular systolic pressure is 69.6 mmHg.   3. Left atrial size was moderately dilated.   4. The mitral valve is abnormal. Mild mitral valve regurgitation.  Moderate mitral annular calcification.   5. The aortic valve is tricuspid. Aortic valve regurgitation is not  visualized. Mild aortic valve stenosis. Aortic valve area, by VTI measures  2.45 cm. Aortic valve mean gradient measures 12.5 mmHg. Aortic valve Vmax  measures 2.18 m/s.   6. Aortic dilatation noted. There is borderline dilatation of the  ascending aorta, measuring 39 mm.   7. The inferior vena cava is dilated in size with <50% respiratory  variability, suggesting right atrial pressure of 15 mmHg.   8. Cannot exclude a small PFO.   Patient Profile     Mr. 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  Cardiology consulted for elevated troponin and MSSA bacteremia.  Assessment & Plan    Elevated troponin: Likely demand ischemia in the setting of bacteremia and osteomyelitis.  He has no chest pain.  EKG is without acute ischemic changes.  Given his lack of ischemic symptoms, ESRD, and GI bleed history, no plan for an ischemia evaluation at this time. Echo this admission with LVEF 60-65% and gradie 1 diastolic dysfunction.   # MSSA bacteremia:  Patient admitted with bacteremia.  ID is following him.  Source is unclear.  He does have osteomyelitis in his foot but per orthopedics this is not likely the source.  TEE has been requested.  Patient has a C1 fracture and is in a c-collar.  This is a relative contraindication.  We can attempt a TEE without manipulation of the head, knowing that there is a much higher likelihood that we will be unable  to successfully pass the probe and images may be more challenging.  Will attempt TEE 02/25/22 at 2pm.  Please make patient NPO the night  before.  Shared Decision Making/Informed Consent The risks [esophageal damage, perforation (1:10,000 risk), bleeding, pharyngeal hematoma as well as other potential complications associated with conscious sedation including aspiration, arrhythmia, respiratory failure and death], benefits (treatment guidance and diagnostic support) and alternatives of a transesophageal echocardiogram were discussed in detail with Mr. Bertino and he is willing to proceed.  Risk of cervical spine fracture dislocation and spinal cord compression with paralysis.  # Mild aortic stenosis:  Noted on echo.  Continue to monitor as an outpatient.  Mean gradient 12.5 mmHg.    We will follow from [place].  Please call with questions.     For questions or updates, please contact [place] Please consult [email or web address] for contact info under        Signed, Skeet Latch, MD  02/23/2022, 8:36 AM

## 2022-02-23 NOTE — Progress Notes (Addendum)
[facility] PROGRESS NOTE    Progress Note  Darryl Diaz  BMW:[id number] DOB: 12-[name]-1941 DOA: 02/21/2022 PCP: [name], MD     Brief Narrative:   Darryl Diaz is an 81 y.o. 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 Surveillance blood cultures 02/22/2022 have been negative. Initially started on IV empiric antibiotics, ID was consulted now has been transitioned to IV cefazolin. Tmax of 101.6, no leukocytosis, blood pressure and heart rate has been stable. Currently satting greater 90% on room air. I do believe he does not have pneumonia there was no cough or shortness of breath on admission. ID recommended MRI thoracic spine showed signal intensity at C6-C7 interspace. MRI of the lumbar spine shows spondylosis and spinal stenosis of L1 and L2. Infectious disease also recommended a TEE, but the patient had a CT of the cervical spine on 02/18/2022 that showed a stable posterior articular facet of C1, for which the patient is in a c-collar, there is a relative contraindication, so cardiology deemed him not a candidate.  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 Sharol Given has been notified for left foot osteomyelitis. Currently on empiric  antibiotics. Ortho relates the foot does not appear to be the cause of sepsis.  Hypokalemia: Nephrology has been consulted, further management per hemodialysis.  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 No signs of overt bleeding. Might benefit from transfusion during hemodialysis.  C1 articular facet fracture: Currently in a c-collar. There is a relative contraindication for TEE due to neck manipulation.  Elevated troponins: In the setting of sepsis and end-stage renal disease, likely demand ischemia he denies any chest pain twelve-lead EKG showed no signs of ischemia.  Mild aortic stenosis: Mean gradient of 12 continue to monitor as an outpatient.  Mild hyponatremia: Further management per renal with dialysis.  Diarrhea: Has had no further diarrhea in house.  Paroxysmal atrial fibrillation: On anticoagulation rate control continue amiodarone.  Essential hypertension: Likely due to sepsis permissive hypertension was allowed his blood pressure has remained relatively stable. Coreg held on admission blood pressure and heart rate has remained relatively stable.  Hyperlipidemia: Continue statins.  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  Prolonged QTc: Noted continue to monitor on telemetry.    DVT prophylaxis: lovenox Family Communication:none Status is: Inpatient Remains inpatient appropriate because: Likely back to SNF after infectious work-up and treatment has been completed.    Code Status:     Code Status Orders  (From admission, onward)           Start     Ordered   02/22/22 0243  Do not attempt resuscitation (DNR)  Continuous       Question  Answer Comment  In the event of cardiac or respiratory ARREST Do not call a "code blue"   In the event of cardiac or respiratory  ARREST Do not perform Intubation, CPR, defibrillation or ACLS   In the event of cardiac or respiratory ARREST Use medication by any route, position, wound care, and other measures to relive pain and suffering. May use oxygen, suction and manual treatment of airway obstruction as needed for comfort.      02/22/22 0242           Code Status History     Date Active Date Inactive Code Status Order ID Comments User Context   01/21/2022 0603 01/27/2022 1811 DNR [id number]  Shela Leff, MD ED   11/24/2021 0007 11/26/2021 2014 DNR [id number]  Rhetta Mura, DO ED   09/27/2021 2202 09/30/2021 2305 DNR [id number]  Rhetta Mura, DO ED   06/07/2021 2050 06/14/2021 2022 DNR [id number]  Rhetta Mura, DO ED   05/02/2021 0353 05/02/2021 2111 DNR [id number]  Elwyn Reach, MD Inpatient   01/17/2021 0623 01/22/2021 2354 DNR [id number]  Reubin Milan, MD ED   01/06/2021 2258 01/09/2021 2208 DNR [id number]  Lenore Cordia, MD ED   12/02/2020 1537 12/04/2020 1641 Full Code [id number]  Karmen Bongo, MD ED   10/13/2020 2222 10/17/2020 2016 Full Code [id number]  Chotiner, Yevonne Aline, MD Inpatient   09/07/2020 2215 09/13/2020 2142 Full Code [id number]  Lenore Cordia, MD ED   03/16/2020 0811 03/20/2020 1908 Full Code [id number]  Karmen Bongo, MD ED   03/06/2020 0100 03/11/2020 1501 Full Code [id number]  Cathlyn Parsons, PA-C Inpatient   03/06/2020 0100 03/06/2020 0100 Full Code [id number]  Cathlyn Parsons, PA-C Inpatient   02/22/2020 1704 03/06/2020 0029 Full Code [id number]  Freada Bergeron, MD ED   02/05/2020 2210 02/08/2020 0218 Full Code [id number]  Barrett, Evelene Croon, PA-C ED   10/15/2019 1204 10/16/2019 1922 Full Code [id number]  Norman Herrlich Inpatient   04/20/2019 1844 04/23/2019 1723 Full Code [id number]  Donita Brooks, NP ED   02/25/2019 2021 03/05/2019 1432 Full Code [id number]  Shela Leff, MD ED   08/11/2018 1707 08/12/2018 1446 Full Code [id number]  Barb Merino, MD Inpatient    04/30/2016 0253 05/04/2016 1828 Full Code [id number]  Wylene Simmer, MD Inpatient   03/30/2016 1347 04/04/2016 1440 Full Code [id number]  Rondel Jumbo, PA-C ED   03/30/2016 1347 03/30/2016 1347 Full Code [id number]  Rondel Jumbo, PA-C ED         IV Access:   Peripheral IV   Procedures and diagnostic studies:   MR LUMBAR SPINE WO CONTRAST  Result Date: 02/23/2022 CLINICAL DATA:  Initial evaluation for staph bacteremia. EXAM: MRI LUMBAR SPINE WITHOUT CONTRAST TECHNIQUE: Multiplanar, multisequence MR imaging of the lumbar spine was performed. No intravenous contrast was administered. COMPARISON:  Prior CT from 02/21/2022. FINDINGS: Segmentation: Examination is severely degraded by motion artifact, markedly limiting assessment. Standard segmentation. Alignment: Physiologic with preservation of the normal lumbar lordosis. No significant listhesis. Vertebrae: Vertebral body height grossly maintained with no acute or chronic fracture. Bone marrow signal intensity diffusely heterogeneous. No worrisome osseous lesions. Fluid signal intensity with adjacent mild reactive endplate changes noted at the T12-L1, L1-2, L2-3, L3-4, and L4-5 interspaces. While these findings are in large part suspected to be degenerative, possible infection with osteomyelitis discitis is difficult to exclude on this severely motion degraded exam. Question of mild paraspinous edema within the adjacent psoas musculature. Conus medullaris  and cauda equina: Conus extends to the L1 level. Conus and cauda equina are grossly within normal limits. No visible epidural collections, although evaluation is fairly limited. Paraspinal and other soft tissues: Question mild paraspinous edema within the psoas musculature bilaterally. No visible collections. Disc levels: T12-L1: Disc bulge with endplate spurring. Left greater than right facet hypertrophy. No significant spinal stenosis. Foramina appear grossly patent. L1-2: Intervertebral disc  space narrowing with diffuse disc bulge and reactive endplate change. Moderate facet hypertrophy. At least moderate spinal stenosis. Foramina appear grossly patent. L2-3: Degenerative intervertebral disc space narrowing with diffuse disc bulge and reactive endplate change. Moderate facet hypertrophy. Resultant severe spinal stenosis. Moderate right L2 foraminal narrowing. Left neural foramen remains patent. L3-4: Degenerative intervertebral disc space narrowing with diffuse disc bulge and endplate spurring. Moderate facet arthrosis. Resultant severe spinal stenosis. Foramina appear grossly patent. L4-5: Degenerative intervertebral disc space narrowing with diffuse disc bulge and reactive endplate spurring. Moderate facet hypertrophy. Moderate canal with severe left and moderate right lateral recess stenosis. Mild to moderate left L4 foraminal narrowing. Right neural foramen remains patent. L5-S1: Advanced degenerative intervertebral disc space narrowing with diffuse disc bulge and reactive endplate spurring. Moderate facet hypertrophy. Moderate left greater than right lateral recess stenosis. Central canal remains patent. No significant foraminal stenosis. IMPRESSION: 1. Severely motion degraded exam, markedly limiting assessment. 2. Fluid signal intensity with adjacent mild reactive endplate changes at the T12-L1, L1-2, L2-3, L3-4, and L4-5 interspaces. While these findings could be degenerative in nature, possible infection with osteomyelitis discitis is difficult to exclude on this severely motion degraded exam. Correlation with symptomatology and laboratory values recommended. 3. 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 Electronically Signed   By: Jeannine Boga M.D.   On: 02/23/2022 01:29   MR THORACIC SPINE WO CONTRAST  Result Date: 02/23/2022 CLINICAL DATA:  Initial evaluation for staph  bacteremia. EXAM: MRI THORACIC SPINE WITHOUT CONTRAST TECHNIQUE: Multiplanar, multisequence MR imaging of the thoracic spine was performed. No intravenous contrast was administered. COMPARISON:  Prior CT from 02/21/2022. FINDINGS: Alignment: Examination is severely degraded by motion artifact, markedly limiting assessment. Mild exaggeration of the normal thoracic kyphosis.  No listhesis. Vertebrae: Vertebral body height grossly maintained without acute or chronic fracture. Bone marrow signal intensity diffusely heterogeneous. 1 cm T1/T2 hyperintense lesion within the T1 vertebral body, likely a hemangioma. No definite worrisome osseous lesions. Fluid signal intensity with mild disc space widening and reactive endplate edema noted about the partially visualized C6-7 interspace (series 6, image 10). Suggestion of mild paraspinous edema within this region. While these findings could be degenerative, possible osteomyelitis discitis could also have this appearance. Otherwise, no other convincing evidence for acute infection elsewhere within the thoracic spine on this motion degraded exam. Cord: Grossly normal signal and morphology. No visible epidural collections. Paraspinal and other soft tissues: Probable mild paraspinous edema adjacent to the C6-7 interspace. No other visible paraspinous soft tissue abnormality. Layering bilateral pleural effusions with associated atelectasis and/or consolidation. Disc levels: Multilevel disc desiccation with mild noncompressive disc bulging and reactive endplate spurring seen throughout the thoracic spine. No significant or high-grade spinal stenosis. Foramina appear grossly patent. IMPRESSION: 1. Severely motion degraded exam. 2. Fluid signal intensity with reactive endplate in paraspinous edema about the partially visualized C6-7 interspace. While these findings could be degenerative in nature, possible osteomyelitis discitis could also have this appearance. Correlation with  symptomatology and laboratory values recommended. Further evaluation with dedicated  MRI of the cervical spine could be performed for further evaluation as the patient is able to tolerate. 3. No other convincing evidence for acute infection within the thoracic spine. 4. Layering bilateral pleural effusions with associated atelectasis and/or consolidation. Electronically Signed   By: Jeannine Boga M.D.   On: 02/23/2022 01:20   ECHOCARDIOGRAM COMPLETE  Result Date: 02/22/2022    ECHOCARDIOGRAM REPORT   Patient Name:   TREMANE SPURGEON Date of Exam: 02/22/2022 Medical Rec #:  [id number]        Height:       72.0 in Accession #:    [id number]       Weight:       171.0 lb Date of Birth:  June 15, 1940        BSA:          1.993 m Patient Age:    48 years         BP:           110/43 mmHg Patient Gender: M                HR:           124 bpm. Exam Location:  Inpatient Procedure: 2D Echo, Cardiac Doppler and Color Doppler Indications:    elevated trops  History:        Patient has prior history of Echocardiogram examinations, most                 recent 01/17/2021. CAD, Arrythmias:Bradycardia; Risk                 Factors:Hypertension and Dyslipidemia.  Sonographer:    Melissa Morford RDCS (AE, PE) Referring Phys: [place]  1. Left ventricular ejection fraction, by estimation, is 60 to 65%. The left ventricle has normal function. The left ventricle has no regional wall motion abnormalities. Left ventricular diastolic parameters are consistent with Grade I diastolic dysfunction (impaired relaxation). Elevated left ventricular end-diastolic pressure. The E/e' is 92.  2. Right ventricular systolic function is normal. The right ventricular size is normal. There is normal pulmonary artery systolic pressure. The estimated right ventricular systolic pressure is 67.1 mmHg.  3. Left atrial size was moderately dilated.  4. The mitral valve is abnormal. Mild mitral valve regurgitation. Moderate  mitral annular calcification.  5. The aortic valve is tricuspid. Aortic valve regurgitation is not visualized. Mild aortic valve stenosis. Aortic valve area, by VTI measures 2.45 cm. Aortic valve mean gradient measures 12.5 mmHg. Aortic valve Vmax measures 2.18 m/s.  6. Aortic dilatation noted. There is borderline dilatation of the ascending aorta, measuring 39 mm.  7. The inferior Darryl cava is dilated in size with <50% respiratory variability, suggesting right atrial pressure of 15 mmHg.  8. Cannot exclude a small PFO. Comparison(s): Changes from prior study are noted. 01/17/2021: LVEF 60-65%, mild AS - mean gradient 12 mmHg. FINDINGS  Left Ventricle: Left ventricular ejection fraction, by estimation, is 60 to 65%. The left ventricle has normal function. The left ventricle has no regional wall motion abnormalities. The left ventricular internal cavity size was normal in size. There is  no left ventricular hypertrophy. Left ventricular diastolic parameters are consistent with Grade I diastolic dysfunction (impaired relaxation). Elevated left ventricular end-diastolic pressure. The E/e' is 15. Right Ventricle: The right ventricular size is normal. No increase in right ventricular wall thickness. Right ventricular systolic function is normal. There is normal pulmonary artery systolic pressure. The tricuspid regurgitant velocity is 1.63  m/s, and  with an assumed right atrial pressure of 15 mmHg, the estimated right ventricular systolic pressure is 08.6 mmHg. Left Atrium: Left atrial size was moderately dilated. Right Atrium: Right atrial size was normal in size. Pericardium: There is no evidence of pericardial effusion. Mitral Valve: The mitral valve is abnormal. There is moderate calcification of the posterior mitral valve leaflet(s). Moderate mitral annular calcification. Mild mitral valve regurgitation. Tricuspid Valve: The tricuspid valve is grossly normal. Tricuspid valve regurgitation is trivial. Aortic Valve:  The aortic valve is tricuspid. Aortic valve regurgitation is not visualized. Mild aortic stenosis is present. Aortic valve mean gradient measures 12.5 mmHg. Aortic valve peak gradient measures 19.0 mmHg. Aortic valve area, by VTI measures 2.45 cm. Pulmonic Valve: The pulmonic valve was grossly normal. Pulmonic valve regurgitation is trivial. Aorta: Aortic dilatation noted. There is borderline dilatation of the ascending aorta, measuring 39 mm. Venous: The inferior Darryl cava is dilated in size with less than 50% respiratory variability, suggesting right atrial pressure of 15 mmHg. IAS/Shunts: Cannot exclude a small PFO.  LEFT VENTRICLE PLAX 2D LVIDd:         5.33 cm      Diastology LVIDs:         4.13 cm      LV e' medial:    8.92 cm/s LV PW:         0.93 cm      LV E/e' medial:  13.9 LV IVS:        0.87 cm      LV e' lateral:   6.64 cm/s LVOT diam:     2.10 cm      LV E/e' lateral: 18.7 LV SV:         108 LV SV Index:   54 LVOT Area:     3.46 cm  LV Volumes (MOD) LV vol d, MOD A2C: 143.0 ml LV vol d, MOD A4C: 162.0 ml LV vol s, MOD A2C: 70.6 ml LV vol s, MOD A4C: 65.8 ml LV SV MOD A2C:     72.4 ml LV SV MOD A4C:     162.0 ml LV SV MOD BP:      88.1 ml RIGHT VENTRICLE RV S prime:     11.30 cm/s TAPSE (M-mode): 2.6 cm LEFT ATRIUM             Index        RIGHT ATRIUM           Index LA diam:        4.20 cm 2.11 cm/m   RA Area:     14.50 cm LA Vol (A2C):   77.0 ml 38.63 ml/m  RA Volume:   34.20 ml  17.16 ml/m LA Vol (A4C):   87.3 ml 43.80 ml/m LA Biplane Vol: 87.7 ml 44.00 ml/m  AORTIC VALVE AV Area (Vmax):    2.38 cm AV Area (Vmean):   2.31 cm AV Area (VTI):     2.45 cm AV Vmax:           218.00 cm/s AV Vmean:          167.000 cm/s AV VTI:            0.440 m AV Peak Grad:      19.0 mmHg AV Mean Grad:      12.5 mmHg LVOT Vmax:         150.00 cm/s LVOT Vmean:        111.500 cm/s LVOT VTI:  0.310 m LVOT/AV VTI ratio: 0.71  AORTA Ao Root diam: 3.60 cm Ao Asc diam:  3.90 cm MITRAL VALVE                 TRICUSPID VALVE MV Area (PHT): 4.80 cm     TR Peak grad:   10.6 mmHg MV Decel Time: 158 msec     TR Vmax:        163.00 cm/s MV E velocity: 124.00 cm/s MV A velocity: 121.00 cm/s  SHUNTS MV E/A ratio:  1.02         Systemic VTI:  0.31 m                             Systemic Diam: 2.10 cm Lyman Bishop MD Electronically signed by Lyman Bishop MD Signature Date/Time: 02/22/2022/11:01:26 AM    Final    DG Foot 2 Views Left  Result Date: 02/22/2022 CLINICAL DATA:  Cellulitis EXAM: LEFT FOOT - 2 VIEW COMPARISON:  Radiographs 11/02/2021; MRI 06/08/2021; radiographs 06/07/2021 FINDINGS: 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 Osseous destruction at the base of the second proximal phalanx in the head of the second metatarsal consistent with osteomyelitis. Question osteo myelitis of the medial aspect of the fourth metatarsal head versus projection artifact. Remote posttraumatic deformity about the head of the third metatarsal. 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 Postsurgical changes about the ankle. 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 Question osteomyelitis of the medial aspect of the fourth metatarsal head. Electronically Signed   By: Placido Sou M.D.   On: 02/22/2022 02:04   DG Chest 1 View  Result Date: 02/21/2022 CLINICAL DATA:  Weakness EXAM: CHEST  1 VIEW COMPARISON:  02/20/2022 FINDINGS: Stable cardiomediastinal contours. Slightly low lung volumes with bibasilar atelectasis. Trace effusions. No pneumothorax. Advanced degenerative changes of the left glenohumeral joint. IMPRESSION: Bibasilar atelectasis and trace bilateral pleural effusions. Electronically Signed   By: Davina Poke D.O.   On: 02/21/2022 20:42     Medical Consultants:   None.   Subjective:    Darryl Diaz he relates no complaint except for a little bit of back pain otherwise feels well.  Objective:    Vitals:   02/23/22 0500 02/23/22 0530 02/23/22 0600  02/23/22 0630  BP: (!) 120/44 (!) 102/53 (!) 108/53 117/62  Pulse: 88 85 (!) 162 81  Resp: (!) 26 (!) 26 (!) 22 (!) 32  Temp:   97.6 F (36.4 C)   TempSrc:   Oral   SpO2: 93% 92% 90% 93%  Weight:      Height:       SpO2: 93 %   Intake/Output Summary (Last 24 hours) at 02/23/2022 0175 Last data filed at 02/22/2022 1403 Gross per 24 hour  Intake 120 ml  Output 0.8 ml  Net 119.2 ml   Filed Weights   02/21/22 1959  Weight: 77.6 kg    Exam: General exam: In no acute distress, c-collar in place Respiratory system: Good air movement and clear to auscultation. Cardiovascular system: S1 & S2 heard, RRR. No JVD.  Gastrointestinal system: Abdomen is nondistended, soft and nontender.  Extremities: No pedal edema. Skin: Surgical absent toe no pain or erythema Psychiatry: Judgement and insight appear normal. Mood & affect appropriate.    Data Reviewed:    Labs: Basic Metabolic Panel: Recent Labs  Lab 02/20/22 2308 02/21/22 2009 02/22/22 0230 02/22/22 1025 02/23/22 8527  NA 129* 137 135 135 138  K 3.5 3.2* 3.0* 2.9* 3.6  CL 94* 98 100  --  99  CO2 20* 25 25  --  25  GLUCOSE 123* 92 96  --  99  BUN 50* 20 24*  --  22  CREATININE 3.69* 2.05* 2.32*  --  2.17*  CALCIUM 8.2* 8.2* 7.9*  --  8.1*  MG 1.4* 1.7 1.7  --   --   PHOS  --   --  2.4*  --   --    GFR Estimated Creatinine Clearance: 29.8 mL/min (A) (by C-G formula based on SCr of 2.17 mg/dL (H)). Liver Function Tests: Recent Labs  Lab 02/20/22 2308 02/21/22 2009 02/22/22 0230  AST 28 40 37  ALT '15 17 19  '$ ALKPHOS 103 76 62  BILITOT 1.4* 1.6* 1.6*  PROT 5.5* 5.2* 4.8*  ALBUMIN 3.0* 2.6* 2.4*   No results for input(s): "LIPASE", "AMYLASE" in the last 168 hours. Recent Labs  Lab 02/22/22 0230  AMMONIA 13   Coagulation profile Recent Labs  Lab 02/21/22 2009 02/22/22 0230  INR 1.4* 1.4*   COVID-19 Labs  Recent Labs    02/22/22 0230  FERRITIN 1,703*    Lab Results  Component Value Date    SARSCOV2NAA NEGATIVE 02/22/2022   SARSCOV2NAA NEGATIVE 02/21/2022   SARSCOV2NAA NEGATIVE 12/20/2021   SARSCOV2NAA POSITIVE (A) 06/03/2021    CBC: Recent Labs  Lab 02/20/22 2308 02/21/22 2009 02/22/22 0243 02/23/22 0243  WBC 5.7 7.9  --  7.9  NEUTROABS 4.8 6.8  --   --   HGB 9.3* 8.4* 7.1* 7.6*  HCT 26.4* 23.9* 21.0* 22.6*  MCV 101.5* 100.8*  --  104.1*  PLT 93* 83*  --  73*   Cardiac Enzymes: Recent Labs  Lab 02/22/22 0230  CKTOTAL 256   BNP (last 3 results) No results for input(s): "PROBNP" in the last 8760 hours. CBG: No results for input(s): "GLUCAP" in the last 168 hours. D-Dimer: No results for input(s): "DDIMER" in the last 72 hours. Hgb A1c: No results for input(s): "HGBA1C" in the last 72 hours. Lipid Profile: No results for input(s): "CHOL", "HDL", "LDLCALC", "TRIG", "CHOLHDL", "LDLDIRECT" in the last 72 hours. Thyroid function studies: Recent Labs    02/22/22 0230  TSH 2.489   Anemia work up: Recent Labs    02/22/22 0230  VITAMINB12 826  FOLATE 20.8  FERRITIN 1,703*  TIBC 150*  IRON 15*   Sepsis Labs: Recent Labs  Lab 02/20/22 2308 02/21/22 2009 02/21/22 2300 02/22/22 0230 02/23/22 0243  PROCALCITON  --   --   --  8.80 10.32  WBC 5.7 7.9  --   --  7.9  LATICACIDVEN  --  2.8* 2.0* 1.4  --    Microbiology Recent Results (from the past 240 hour(s))  SARS Coronavirus 2 by RT PCR (hospital order, performed in [place] hospital lab) *cepheid single result test* Anterior Nasal Swab     Status: None   Collection Time: 02/21/22  3:16 AM   Specimen: Anterior Nasal Swab  Result Value Ref Range Status   SARS Coronavirus 2 by RT PCR NEGATIVE NEGATIVE Final    Comment: (NOTE) SARS-CoV-2 target nucleic acids are NOT DETECTED.  The SARS-CoV-2 RNA is generally detectable in upper and lower respiratory specimens during the acute phase of infection. The lowest concentration of SARS-CoV-2 viral copies this assay can detect is 250 copies / mL. A  negative result does not preclude SARS-CoV-2 infection and  should not be used as the sole basis for treatment or other patient management decisions.  A negative result may occur with improper specimen collection / handling, submission of specimen other than nasopharyngeal swab, presence of viral mutation(s) within the areas targeted by this assay, and inadequate number of viral copies (<250 copies / mL). A negative result must be combined with clinical observations, patient history, and epidemiological information.  Fact Sheet for Patients:   https://www.patel.info/  Fact Sheet for Healthcare Providers: https://hall.com/  This test is not yet approved or  cleared by the [place] FDA and has been authorized for detection and/or diagnosis of SARS-CoV-2 by FDA under an Emergency Use Authorization (EUA).  This EUA will remain in effect (meaning this test can be used) for the duration of the COVID-19 declaration under Section 564(b)(1) of the Act, 21 U.S.C. section 360bbb-3(b)(1), unless the authorization is terminated or revoked sooner.  Performed at [facility], [place] [address] [place], [place] [address]   Blood culture (routine x 2)     Status: Abnormal (Preliminary result)   Collection Time: 02/21/22  4:07 AM   Specimen: BLOOD RIGHT HAND  Result Value Ref Range Status   Specimen Description BLOOD RIGHT HAND  Final   Special Requests AEROBIC BOTTLE ONLY Blood Culture adequate volume  Final   Culture  Setup Time   Final    GRAM POSITIVE COCCI IN CLUSTERS AEROBIC BOTTLE ONLY CRITICAL RESULT CALLED TO, READ BACK BY AND VERIFIED WITH: PHARM CATHY PEARCE ON 02/21/22 @ 2022 BY DRT    Culture (A)  Final    STAPHYLOCOCCUS AUREUS SUSCEPTIBILITIES TO FOLLOW Performed at [facility], [place] [address], [place], [place] [address]    Report Status PENDING  Incomplete  Blood Culture ID Panel (Reflexed)     Status: Abnormal    Collection Time: 02/21/22  4:07 AM  Result Value Ref Range Status   Enterococcus faecalis NOT DETECTED NOT DETECTED Final   Enterococcus Faecium NOT DETECTED NOT DETECTED Final   Listeria monocytogenes NOT DETECTED NOT DETECTED Final   Staphylococcus species DETECTED (A) NOT DETECTED Final    Comment: CRITICAL RESULT CALLED TO, READ BACK BY AND VERIFIED WITH: PHARM CATHY PEARCE ON 02/21/22 @ 2022 BY DRT    Staphylococcus aureus (BCID) DETECTED (A) NOT DETECTED Final    Comment: CRITICAL RESULT CALLED TO, READ BACK BY AND VERIFIED WITH: PHARM CATHY PEARCE ON 02/21/22 @ 2022 BY DRT    Staphylococcus epidermidis NOT DETECTED NOT DETECTED Final   Staphylococcus lugdunensis NOT DETECTED NOT DETECTED Final   Streptococcus species NOT DETECTED NOT DETECTED Final   Streptococcus agalactiae NOT DETECTED NOT DETECTED Final   Streptococcus pneumoniae NOT DETECTED NOT DETECTED Final   Streptococcus pyogenes NOT DETECTED NOT DETECTED Final   A.calcoaceticus-baumannii NOT DETECTED NOT DETECTED Final   Bacteroides fragilis NOT DETECTED NOT DETECTED Final   Enterobacterales NOT DETECTED NOT DETECTED Final   Enterobacter cloacae complex NOT DETECTED NOT DETECTED Final   Escherichia coli NOT DETECTED NOT DETECTED Final   Klebsiella aerogenes NOT DETECTED NOT DETECTED Final   Klebsiella oxytoca NOT DETECTED NOT DETECTED Final   Klebsiella pneumoniae NOT DETECTED NOT DETECTED Final   Proteus species NOT DETECTED NOT DETECTED Final   Salmonella species NOT DETECTED NOT DETECTED Final   Serratia marcescens NOT DETECTED NOT DETECTED Final   Haemophilus influenzae NOT DETECTED NOT DETECTED Final   Neisseria meningitidis NOT DETECTED NOT DETECTED Final   Pseudomonas aeruginosa NOT DETECTED NOT DETECTED  Final   Stenotrophomonas maltophilia NOT DETECTED NOT DETECTED Final   Candida albicans NOT DETECTED NOT DETECTED Final   Candida auris NOT DETECTED NOT DETECTED Final   Candida glabrata NOT DETECTED NOT  DETECTED Final   Candida krusei NOT DETECTED NOT DETECTED Final   Candida parapsilosis NOT DETECTED NOT DETECTED Final   Candida tropicalis NOT DETECTED NOT DETECTED Final   Cryptococcus neoformans/gattii NOT DETECTED NOT DETECTED Final   Meth resistant mecA/C and MREJ NOT DETECTED NOT DETECTED Final    Comment: Performed at [facility], [place] [address] [place], [place] [address]  Blood culture (routine x 2)     Status: Abnormal (Preliminary result)   Collection Time: 02/21/22  6:19 AM   Specimen: BLOOD LEFT HAND  Result Value Ref Range Status   Specimen Description BLOOD LEFT HAND  Final   Special Requests AEROBIC BOTTLE ONLY Blood Culture adequate volume  Final   Culture  Setup Time   Final    GRAM POSITIVE COCCI IN CLUSTERS AEROBIC BOTTLE ONLY CRITICAL VALUE NOTED.  VALUE IS CONSISTENT WITH PREVIOUSLY REPORTED AND CALLED VALUE. Performed at [facility], [place] [address] [place], [place] [address]    Culture STAPHYLOCOCCUS AUREUS (A)  Final   Report Status PENDING  Incomplete  Blood Culture (routine x 2)     Status: None (Preliminary result)   Collection Time: 02/21/22  8:42 PM   Specimen: BLOOD RIGHT FOREARM  Result Value Ref Range Status   Specimen Description BLOOD RIGHT FOREARM  Final   Special Requests   Final    BOTTLES DRAWN AEROBIC AND ANAEROBIC Blood Culture adequate volume   Culture  Setup Time   Final    GRAM POSITIVE COCCI IN CLUSTERS IN BOTH AEROBIC AND ANAEROBIC BOTTLES CRITICAL VALUE NOTED.  VALUE IS CONSISTENT WITH PREVIOUSLY REPORTED AND CALLED VALUE. Performed at [facility], [place] [address], [place], [place] [address]    Culture GRAM POSITIVE COCCI  Final   Report Status PENDING  Incomplete  Blood Culture (routine x 2)     Status: None (Preliminary result)   Collection Time: 02/21/22  8:49 PM   Specimen: BLOOD  Result Value Ref Range Status   Specimen Description BLOOD RIGHT ANTECUBITAL  Final   Special Requests   Final    BOTTLES  DRAWN AEROBIC AND ANAEROBIC Blood Culture adequate volume   Culture  Setup Time   Final    GRAM POSITIVE COCCI IN CLUSTERS IN BOTH AEROBIC AND ANAEROBIC BOTTLES CRITICAL RESULT CALLED TO, READ BACK BY AND VERIFIED WITH: PHARMD [address] [id number] AT 4166 BY CM Performed at [facility], [place] [address], [place], [place] [address]    Culture GRAM POSITIVE COCCI  Final   Report Status PENDING  Incomplete  Resp Panel by RT-PCR (Flu A&B, Covid) Anterior Nasal Swab     Status: None   Collection Time: 02/22/22  3:28 AM   Specimen: Anterior Nasal Swab  Result Value Ref Range Status   SARS Coronavirus 2 by RT PCR NEGATIVE NEGATIVE Final    Comment: (NOTE) SARS-CoV-2 target nucleic acids are NOT DETECTED.  The SARS-CoV-2 RNA is generally detectable in upper respiratory specimens during the acute phase of infection. The lowest concentration of SARS-CoV-2 viral copies this assay can detect is 138 copies/mL. A negative result does not preclude SARS-Cov-2 infection and should not be used as the sole basis for treatment or other patient management decisions. A negative result may occur with  improper specimen collection/handling, submission of specimen other than nasopharyngeal swab, presence of viral mutation(s) within the areas targeted by this assay, and inadequate number of viral copies(<138 copies/mL). A negative result must be combined with clinical observations, patient history, and epidemiological information. The expected result is Negative.  Fact Sheet for Patients:  EntrepreneurPulse.com.au  Fact Sheet for Healthcare Providers:  IncredibleEmployment.be  This test is no t yet approved or cleared by the [place] FDA and  has been authorized for detection and/or diagnosis of SARS-CoV-2 by FDA under an Emergency Use Authorization (EUA). This EUA will remain  in effect (meaning this test can be used) for the duration of the COVID-19  declaration under Section 564(b)(1) of the Act, 21 U.S.C.section 360bbb-3(b)(1), unless the authorization is terminated  or revoked sooner.       Influenza A by PCR NEGATIVE NEGATIVE Final   Influenza B by PCR NEGATIVE NEGATIVE Final    Comment: (NOTE) The Xpert Xpress SARS-CoV-2/FLU/RSV plus assay is intended as an aid in the diagnosis of influenza from Nasopharyngeal swab specimens and should not be used as a sole basis for treatment. Nasal washings and aspirates are unacceptable for Xpert Xpress SARS-CoV-2/FLU/RSV testing.  Fact Sheet for Patients: EntrepreneurPulse.com.au  Fact Sheet for Healthcare Providers: IncredibleEmployment.be  This test is not yet approved or cleared by the [place] FDA and has been authorized for detection and/or diagnosis of SARS-CoV-2 by FDA under an Emergency Use Authorization (EUA). This EUA will remain in effect (meaning this test can be used) for the duration of the COVID-19 declaration under Section 564(b)(1) of the Act, 21 U.S.C. section 360bbb-3(b)(1), unless the authorization is terminated or revoked.  Performed at [facility], [place] [address], [place], [place] [address]   Respiratory (~20 pathogens) panel by PCR     Status: None   Collection Time: 02/22/22  3:28 AM   Specimen: Urine, Clean Catch; Respiratory  Result Value Ref Range Status   Adenovirus NOT DETECTED NOT DETECTED Final   Coronavirus 229E NOT DETECTED NOT DETECTED Final    Comment: (NOTE) The Coronavirus on the Respiratory Panel, DOES NOT test for the novel  Coronavirus (2019 nCoV)    Coronavirus HKU1 NOT DETECTED NOT DETECTED Final   Coronavirus NL63 NOT DETECTED NOT DETECTED Final   Coronavirus OC43 NOT DETECTED NOT DETECTED Final   Metapneumovirus NOT DETECTED NOT DETECTED Final   Rhinovirus / Enterovirus NOT DETECTED NOT DETECTED Final   Influenza A NOT DETECTED NOT DETECTED Final   Influenza B NOT DETECTED NOT  DETECTED Final   Parainfluenza Virus 1 NOT DETECTED NOT DETECTED Final   Parainfluenza Virus 2 NOT DETECTED NOT DETECTED Final   Parainfluenza Virus 3 NOT DETECTED NOT DETECTED Final   Parainfluenza Virus 4 NOT DETECTED NOT DETECTED Final   Respiratory Syncytial Virus NOT DETECTED NOT DETECTED Final   Bordetella pertussis NOT DETECTED NOT DETECTED Final   Bordetella Parapertussis NOT DETECTED NOT DETECTED Final   Chlamydophila pneumoniae NOT DETECTED NOT DETECTED Final   Mycoplasma pneumoniae NOT DETECTED NOT DETECTED Final    Comment: Performed at [facility], [place] [address] [place],  [address]  MRSA Next Gen by PCR, Nasal     Status: None   Collection Time: 02/22/22  3:33 AM   Specimen: STOOL; Nasal Swab  Result Value Ref Range Status   MRSA by PCR Next Gen NOT DETECTED NOT DETECTED Final    Comment: (NOTE) The GeneXpert MRSA Assay (FDA approved for NASAL specimens only),  is one component of a comprehensive MRSA colonization surveillance program. It is not intended to diagnose MRSA infection nor to guide or monitor treatment for MRSA infections. Test performance is not FDA approved in patients less than 17 years old. Performed at [facility], [place] [address] [place], [place] [address]      Medications:    amiodarone  200 mg Oral q morning   Chlorhexidine Gluconate Cloth  6 each Topical Q0600   escitalopram  10 mg Oral q morning   lidocaine  3 patch Transdermal Q24H   sodium chloride flush  3 mL Intravenous Q12H   tamsulosin  0.4 mg Oral QHS   Continuous Infusions:  sodium chloride     anticoagulant sodium citrate      ceFAZolin (ANCEF) IV Stopped (02/22/22 1840)      LOS: 1 day   Charlynne Cousins  [facility]  02/23/2022, 6:52 AM

## 2022-02-23 NOTE — ED Notes (Signed)
Patient in mitts on shift change. Patient attempting to remove ccollar and lines

## 2022-02-23 NOTE — Progress Notes (Addendum)
I have seen and examined the patient. I have personally reviewed the clinical findings, laboratory findings, microbiological data and imaging studies. The assessment and treatment plan was discussed with the Nurse Practitioner Mauricio Po. I agree with her/his recommendations except following additions/corrections.  Febrile  Complains of neck and back pain   Exam -not in acute distress and appears comfortable in c collar            Heart/lung sounds WNL            Abdomen soft and non tender            Neuro - awake. 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 [place], [place] [address] Phone: [phone]  Fax: [phone].  [place] for Infectious Disease  Date of Admission:  02/21/2022     Total days of antibiotics 3         ASSESSMENT:   Darryl Diaz admission blood cultures are once again positive for MSSA with new cultures drawn this morning pending. 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 Lumbar MRI concerning for infection despite motion degradation. Will check cervical spine MRI to rule out infection given fever last night. TTE negative and awaiting attempt at TEE to check for endocarditis. In the interim continue current dose of Cefazolin and monitor repeat blood cultures for clearance of bacteremia. Remaining medical and supportive care per primary  team.   PLAN:  Continue current dose of cefazolin Monitor cultures for clearance of bacteremia Await results of TEE Remaining medical and supportive care per primary team.   Principal Problem:   Sepsis ([place]) Active Problems:   Hypertension   Hyperlipidemia   GERD (gastroesophageal reflux disease)   Paroxysmal atrial fibrillation (HCC)   Debility   Diarrhea   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HCC)   CAD (coronary artery disease)   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HCC)   Acute hyponatremia   Elevated troponin   Cellulitis   Prolonged QT interval   CAP (community acquired pneumonia)   Staphylococcus aureus bacteremia    amiodarone  200 mg Oral q morning   Chlorhexidine Gluconate Cloth  6 each Topical Q0600   escitalopram  10 mg Oral q morning   lidocaine  3 patch Transdermal Q24H   sodium chloride flush  3 mL Intravenous Q12H   tamsulosin  0.4 mg Oral QHS    SUBJECTIVE:  Febrile overnight with temperature of 101.3 F with no acute events. Asking about removing neck collar. Continues to have back pain and denies any other pains at present.   Allergies  Allergen Reactions   Penicillin G Sodium Rash and Other (See Comments)     (tolerates Keflex)  NOT DOCUMENTED ON THE MAR   Penicillins Rash    NOT DOCUMENTED ON THE MAR     Review of Systems: Review of  Systems  Constitutional:  Negative for chills, fever and weight loss.  Respiratory:  Negative for cough, shortness of breath and wheezing.   Cardiovascular:  Negative for chest pain and leg swelling.  Gastrointestinal:  Negative for abdominal pain, constipation, diarrhea, nausea and vomiting.  Musculoskeletal:  Positive for back pain.  Skin:  Negative for rash.      OBJECTIVE: Vitals:   02/23/22 0700 02/23/22 0730 02/23/22 0800 02/23/22 0830  BP: 109/64 (!) 117/48 (!) 111/55 (!) 121/50  Pulse: 81 78 77 74  Resp: (!) 30 (!) 30 (!) 26 (!) 25  Temp:      TempSrc:      SpO2:  94% 92% 95%  Weight:       Height:       Body mass index is 23.19 kg/m.  Physical Exam Constitutional:      General: He is not in acute distress.    Appearance: He is well-developed.     Interventions: Cervical collar in place.  Cardiovascular:     Rate and Rhythm: Normal rate and regular rhythm.     Heart sounds: Normal heart sounds.  Pulmonary:     Effort: Pulmonary effort is normal.     Breath sounds: Normal breath sounds.  Skin:    General: Skin is warm and dry.  Neurological:     Mental Status: He is alert and oriented to person, place, and time.  Psychiatric:        Behavior: Behavior normal.        Thought Content: Thought content normal.        Judgment: Judgment normal.     Lab Results Lab Results  Component Value Date   WBC 7.9 02/23/2022   HGB 7.6 (L) 02/23/2022   HCT 22.6 (L) 02/23/2022   MCV 104.1 (H) 02/23/2022   PLT 73 (L) 02/23/2022    Lab Results  Component Value Date   CREATININE 2.17 (H) 02/23/2022   BUN 22 02/23/2022   NA 138 02/23/2022   K 3.6 02/23/2022   CL 99 02/23/2022   CO2 25 02/23/2022    Lab Results  Component Value Date   ALT 19 02/22/2022   AST 37 02/22/2022   ALKPHOS 62 02/22/2022   BILITOT 1.6 (H) 02/22/2022     Microbiology: Recent Results (from the past 240 hour(s))  SARS Coronavirus 2 by RT PCR (hospital order, performed in [facility] hospital lab) *cepheid single result test* Anterior Nasal Swab     Status: None   Collection Time: 02/21/22  3:16 AM   Specimen: Anterior Nasal Swab  Result Value Ref Range Status   SARS Coronavirus 2 by RT PCR NEGATIVE NEGATIVE Final    Comment: (NOTE) SARS-CoV-2 target nucleic acids are NOT DETECTED.  The SARS-CoV-2 RNA is generally detectable in upper and lower respiratory specimens during the acute phase of infection. The lowest concentration of SARS-CoV-2 viral copies this assay can detect is 250 copies / mL. A negative result does not preclude SARS-CoV-2 infection and should not be used as the sole  basis for treatment or other patient management decisions.  A negative result may occur with improper specimen collection / handling, submission of specimen other than nasopharyngeal swab, presence of viral mutation(s) within the areas targeted by this assay, and inadequate number of viral copies (<250 copies / mL). A negative result must be combined with clinical observations, patient history, and epidemiological information.  Fact Sheet for Patients:   https://www.patel.info/  Fact Sheet for Healthcare Providers: https://hall.com/  This test is not yet approved or  cleared by the [place] and has been authorized for detection and/or diagnosis of SARS-CoV-2 by FDA under an Emergency Use Authorization (EUA).  This EUA will remain in effect (meaning this test can be used) for the duration of the COVID-19 declaration under Section 564(b)(1) of the Act, 21 U.S.C. section 360bbb-3(b)(1), unless the authorization is terminated or revoked sooner.  Performed at [facility], [place] [address], [place], [place] [address]   Blood culture (routine x 2)     Status: Abnormal   Collection Time: 02/21/22  4:07 AM   Specimen: BLOOD RIGHT HAND  Result Value Ref Range Status   Specimen Description BLOOD RIGHT HAND  Final   Special Requests AEROBIC BOTTLE ONLY Blood Culture adequate volume  Final   Culture  Setup Time   Final    GRAM POSITIVE COCCI IN CLUSTERS AEROBIC BOTTLE ONLY CRITICAL RESULT CALLED TO, READ BACK BY AND VERIFIED WITH: [place] ON 02/21/22 @ 2022 BY DRT Performed at [facility], [place] [address] [place],  [address]    Culture STAPHYLOCOCCUS AUREUS (A)  Final   Report Status 02/23/2022 FINAL  Final   Organism ID, Bacteria STAPHYLOCOCCUS AUREUS  Final      Susceptibility   Staphylococcus aureus - MIC*    CIPROFLOXACIN <=0.5 SENSITIVE Sensitive     ERYTHROMYCIN >=8 RESISTANT Resistant     GENTAMICIN  <=0.5 SENSITIVE Sensitive     OXACILLIN <=0.25 SENSITIVE Sensitive     TETRACYCLINE >=16 RESISTANT Resistant     VANCOMYCIN <=0.5 SENSITIVE Sensitive     TRIMETH/SULFA <=10 SENSITIVE Sensitive     CLINDAMYCIN RESISTANT Resistant     RIFAMPIN <=0.5 SENSITIVE Sensitive     Inducible Clindamycin POSITIVE Resistant     * STAPHYLOCOCCUS AUREUS  Blood Culture ID Panel (Reflexed)     Status: Abnormal   Collection Time: 02/21/22  4:07 AM  Result Value Ref Range Status   Enterococcus faecalis NOT DETECTED NOT DETECTED Final   Enterococcus Faecium NOT DETECTED NOT DETECTED Final   Listeria monocytogenes NOT DETECTED NOT DETECTED Final   Staphylococcus species DETECTED (A) NOT DETECTED Final    Comment: CRITICAL RESULT CALLED TO, READ BACK BY AND VERIFIED WITH: PHARM CATHY PEARCE ON 02/21/22 @ 2022 BY DRT    Staphylococcus aureus (BCID) DETECTED (A) NOT DETECTED Final    Comment: CRITICAL RESULT CALLED TO, READ BACK BY AND VERIFIED WITH: PHARM CATHY PEARCE ON 02/21/22 @ 2022 BY DRT    Staphylococcus epidermidis NOT DETECTED NOT DETECTED Final   Staphylococcus lugdunensis NOT DETECTED NOT DETECTED Final   Streptococcus species NOT DETECTED NOT DETECTED Final   Streptococcus agalactiae NOT DETECTED NOT DETECTED Final   Streptococcus pneumoniae NOT DETECTED NOT DETECTED Final   Streptococcus pyogenes NOT DETECTED NOT DETECTED Final   A.calcoaceticus-baumannii NOT DETECTED NOT DETECTED Final   Bacteroides fragilis NOT DETECTED NOT DETECTED Final   Enterobacterales NOT DETECTED NOT DETECTED Final   Enterobacter cloacae complex NOT DETECTED NOT DETECTED Final   Escherichia coli NOT DETECTED NOT DETECTED Final   Klebsiella aerogenes NOT DETECTED NOT DETECTED Final   Klebsiella oxytoca NOT DETECTED NOT DETECTED Final   Klebsiella pneumoniae NOT DETECTED NOT DETECTED Final   Proteus species NOT DETECTED NOT DETECTED Final   Salmonella species NOT DETECTED NOT DETECTED Final   Serratia marcescens  NOT DETECTED NOT DETECTED Final   Haemophilus influenzae NOT DETECTED NOT DETECTED Final   Neisseria  meningitidis NOT DETECTED NOT DETECTED Final   Pseudomonas aeruginosa NOT DETECTED NOT DETECTED Final   Stenotrophomonas maltophilia NOT DETECTED NOT DETECTED Final   Candida albicans NOT DETECTED NOT DETECTED Final   Candida auris NOT DETECTED NOT DETECTED Final   Candida glabrata NOT DETECTED NOT DETECTED Final   Candida krusei NOT DETECTED NOT DETECTED Final   Candida parapsilosis NOT DETECTED NOT DETECTED Final   Candida tropicalis NOT DETECTED NOT DETECTED Final   Cryptococcus neoformans/gattii NOT DETECTED NOT DETECTED Final   Meth resistant mecA/C and MREJ NOT DETECTED NOT DETECTED Final    Comment: Performed at [facility], [place] [address], [place], [place] [address]  Blood culture (routine x 2)     Status: Abnormal   Collection Time: 02/21/22  6:19 AM   Specimen: BLOOD LEFT HAND  Result Value Ref Range Status   Specimen Description BLOOD LEFT HAND  Final   Special Requests AEROBIC BOTTLE ONLY Blood Culture adequate volume  Final   Culture  Setup Time   Final    GRAM POSITIVE COCCI IN CLUSTERS AEROBIC BOTTLE ONLY CRITICAL VALUE NOTED.  VALUE IS CONSISTENT WITH PREVIOUSLY REPORTED AND CALLED VALUE.    Culture (A)  Final    STAPHYLOCOCCUS AUREUS SUSCEPTIBILITIES PERFORMED ON PREVIOUS CULTURE WITHIN THE LAST 5 DAYS. Performed at [facility], [place] [address] [place], [place] [address]    Report Status 02/23/2022 FINAL  Final  Blood Culture (routine x 2)     Status: Abnormal (Preliminary result)   Collection Time: 02/21/22  8:42 PM   Specimen: BLOOD RIGHT FOREARM  Result Value Ref Range Status   Specimen Description BLOOD RIGHT FOREARM  Final   Special Requests   Final    BOTTLES DRAWN AEROBIC AND ANAEROBIC Blood Culture adequate volume   Culture  Setup Time   Final    GRAM POSITIVE COCCI IN CLUSTERS IN BOTH AEROBIC AND ANAEROBIC BOTTLES CRITICAL VALUE NOTED.   VALUE IS CONSISTENT WITH PREVIOUSLY REPORTED AND CALLED VALUE.    Culture (A)  Final    STAPHYLOCOCCUS AUREUS SUSCEPTIBILITIES PERFORMED ON PREVIOUS CULTURE WITHIN THE LAST 5 DAYS. Performed at [facility], [place] [address], [place], [place] [address]    Report Status PENDING  Incomplete  Blood Culture (routine x 2)     Status: Abnormal (Preliminary result)   Collection Time: 02/21/22  8:49 PM   Specimen: BLOOD  Result Value Ref Range Status   Specimen Description BLOOD RIGHT ANTECUBITAL  Final   Special Requests   Final    BOTTLES DRAWN AEROBIC AND ANAEROBIC Blood Culture adequate volume   Culture  Setup Time   Final    GRAM POSITIVE COCCI IN CLUSTERS IN BOTH AEROBIC AND ANAEROBIC BOTTLES CRITICAL RESULT CALLED TO, READ BACK BY AND VERIFIED WITH: PHARMD E Faith [id number] AT 1234 BY CM    Culture (A)  Final    STAPHYLOCOCCUS AUREUS SUSCEPTIBILITIES PERFORMED ON PREVIOUS CULTURE WITHIN THE LAST 5 DAYS. Performed at [facility], [place] [address] [place], [place] [address]    Report Status PENDING  Incomplete  Resp Panel by RT-PCR (Flu A&B, Covid) Anterior Nasal Swab     Status: None   Collection Time: 02/22/22  3:28 AM   Specimen: Anterior Nasal Swab  Result Value Ref Range Status   SARS Coronavirus 2 by RT PCR NEGATIVE NEGATIVE Final    Comment: (NOTE) SARS-CoV-2 target nucleic acids are NOT DETECTED.  The SARS-CoV-2 RNA is generally detectable  in upper respiratory specimens during the acute phase of infection. The lowest concentration of SARS-CoV-2 viral copies this assay can detect is 138 copies/mL. A negative result does not preclude SARS-Cov-2 infection and should not be used as the sole basis for treatment or other patient management decisions. A negative result may occur with  improper specimen collection/handling, submission of specimen other than nasopharyngeal swab, presence of viral mutation(s) within the areas targeted by this assay, and inadequate  number of viral copies(<138 copies/mL). A negative result must be combined with clinical observations, patient history, and epidemiological information. The expected result is Negative.  Fact Sheet for Patients:  EntrepreneurPulse.com.au  Fact Sheet for Healthcare Providers:  IncredibleEmployment.be  This test is no t yet approved or cleared by the [place] FDA and  has been authorized for detection and/or diagnosis of SARS-CoV-2 by FDA under an Emergency Use Authorization (EUA). This EUA will remain  in effect (meaning this test can be used) for the duration of the COVID-19 declaration under Section 564(b)(1) of the Act, 21 U.S.C.section 360bbb-3(b)(1), unless the authorization is terminated  or revoked sooner.       Influenza A by PCR NEGATIVE NEGATIVE Final   Influenza B by PCR NEGATIVE NEGATIVE Final    Comment: (NOTE) The Xpert Xpress SARS-CoV-2/FLU/RSV plus assay is intended as an aid in the diagnosis of influenza from Nasopharyngeal swab specimens and should not be used as a sole basis for treatment. Nasal washings and aspirates are unacceptable for Xpert Xpress SARS-CoV-2/FLU/RSV testing.  Fact Sheet for Patients: EntrepreneurPulse.com.au  Fact Sheet for Healthcare Providers: IncredibleEmployment.be  This test is not yet approved or cleared by the [place] FDA and has been authorized for detection and/or diagnosis of SARS-CoV-2 by FDA under an Emergency Use Authorization (EUA). This EUA will remain in effect (meaning this test can be used) for the duration of the COVID-19 declaration under Section 564(b)(1) of the Act, 21 U.S.C. section 360bbb-3(b)(1), unless the authorization is terminated or revoked.  Performed at [facility], [place] [address] [place], [place] [address]   Respiratory (~20 pathogens) panel by PCR     Status: None   Collection Time: 02/22/22  3:28 AM    Specimen: Urine, Clean Catch; Respiratory  Result Value Ref Range Status   Adenovirus NOT DETECTED NOT DETECTED Final   Coronavirus 229E NOT DETECTED NOT DETECTED Final    Comment: (NOTE) The Coronavirus on the Respiratory Panel, DOES NOT test for the novel  Coronavirus (2019 nCoV)    Coronavirus HKU1 NOT DETECTED NOT DETECTED Final   Coronavirus NL63 NOT DETECTED NOT DETECTED Final   Coronavirus OC43 NOT DETECTED NOT DETECTED Final   Metapneumovirus NOT DETECTED NOT DETECTED Final   Rhinovirus / Enterovirus NOT DETECTED NOT DETECTED Final   Influenza A NOT DETECTED NOT DETECTED Final   Influenza B NOT DETECTED NOT DETECTED Final   Parainfluenza Virus 1 NOT DETECTED NOT DETECTED Final   Parainfluenza Virus 2 NOT DETECTED NOT DETECTED Final   Parainfluenza Virus 3 NOT DETECTED NOT DETECTED Final   Parainfluenza Virus 4 NOT DETECTED NOT DETECTED Final   Respiratory Syncytial Virus NOT DETECTED NOT DETECTED Final   Bordetella pertussis NOT DETECTED NOT DETECTED Final   Bordetella Parapertussis NOT DETECTED NOT DETECTED Final   Chlamydophila pneumoniae NOT DETECTED NOT DETECTED Final   Mycoplasma pneumoniae NOT DETECTED NOT DETECTED Final    Comment: Performed at [facility], [place] [address] [place],  [address]  MRSA Next Gen by  PCR, Nasal     Status: None   Collection Time: 02/22/22  3:33 AM   Specimen: STOOL; Nasal Swab  Result Value Ref Range Status   MRSA by PCR Next Gen NOT DETECTED NOT DETECTED Final    Comment: (NOTE) The GeneXpert MRSA Assay (FDA approved for NASAL specimens only), is one component of a comprehensive MRSA colonization surveillance program. It is not intended to diagnose MRSA infection nor to guide or monitor treatment for MRSA infections. Test performance is not FDA approved in patients less than 8 years old. Performed at [facility], [place] [address], [place], [place] [address]    Imaging MR CERVICAL SPINE WO CONTRAST  Result Date:  02/23/2022 CLINICAL DATA:  Rule out discitis osteomyelitis.  MSSA bacteremia. EXAM: MRI CERVICAL SPINE WITHOUT CONTRAST TECHNIQUE: Multiplanar, multisequence MR imaging of the cervical spine was performed. No intravenous contrast was administered. COMPARISON:  MRI thoracic spine 02/22/2022 FINDINGS: Alignment: Image quality degraded by motion Mild retrolisthesis C5-6. Vertebrae: Hemangioma T1 vertebral body.  Negative for fracture Prominent fluid in the disc space at C6-7 with mild edema in the adjacent endplates. Mild prevertebral fluid also present. Cord: Cord evaluation limited given the degree of motion. No cord compression or cord lesion identified. Posterior Fossa, vertebral arteries, paraspinal tissues: Prevertebral edema is present. No paraspinous fluid collection. No mass or adenopathy Disc levels: C2-3: Mild disc and facet degeneration. Mild foraminal narrowing bilaterally. C3-4: Disc and facet degeneration. Mild central canal stenosis and mild foraminal narrowing bilaterally due to spurring C4-5: Disc and facet degeneration. Mild central canal stenosis and mild foraminal narrowing bilaterally due to spurring C5-6: Disc and facet degeneration. Mild spinal stenosis and mild foraminal narrowing bilaterally due to spurring C6-7: Large amount of fluid in the disc space with edema in the adjacent endplates. Fluid appears contiguous with prevertebral fluid. Findings are suspicious for discitis and osteomyelitis. No epidural abscess. Right-sided disc and osteophyte complex flattening the cord. There is moderate right foraminal narrowing and mild left foraminal narrowing. Mild to moderate central canal stenosis. C7-T1: Negative for stenosis. IMPRESSION: 1. Image quality degraded by motion. 2. Large amount of fluid in the disc space at C6-7 with edema in the adjacent endplates and mild prevertebral fluid. Findings are suspicious for discitis and osteomyelitis. No epidural abscess. Right-sided disc and osteophyte  complex causing cord deformity and moderate right foraminal narrowing. Mild to moderate spinal stenosis. 3. 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 Electronically Signed   By: Franchot Gallo M.D.   On: 02/23/2022 11:26   MR LUMBAR SPINE WO CONTRAST  Result Date: 02/23/2022 CLINICAL DATA:  Initial evaluation for staph bacteremia. EXAM: MRI LUMBAR SPINE WITHOUT CONTRAST TECHNIQUE: Multiplanar, multisequence MR imaging of the lumbar spine was performed. No intravenous contrast was administered. COMPARISON:  Prior CT from 02/21/2022. FINDINGS: Segmentation: Examination is severely degraded by motion artifact, markedly limiting assessment. Standard segmentation. Alignment: Physiologic with preservation of the normal lumbar lordosis. No significant listhesis. Vertebrae: Vertebral body height grossly maintained with no acute or chronic fracture. Bone marrow signal intensity diffusely heterogeneous. No worrisome osseous lesions. Fluid signal intensity with adjacent mild reactive endplate changes noted at the T12-L1, L1-2, L2-3, L3-4, and L4-5 interspaces. While these findings are in large part suspected to be degenerative, possible infection with osteomyelitis discitis is difficult to exclude on this severely motion degraded exam. Question of mild paraspinous edema within the adjacent psoas musculature. Conus medullaris and cauda equina: Conus extends to the L1  level. Conus and cauda equina are grossly within normal limits. No visible epidural collections, although evaluation is fairly limited. Paraspinal and other soft tissues: Question mild paraspinous edema within the psoas musculature bilaterally. No visible collections. Disc levels: T12-L1: Disc bulge with endplate spurring. Left greater than right facet hypertrophy. No significant spinal stenosis. Foramina appear grossly patent. L1-2: Intervertebral disc space narrowing with diffuse disc bulge and reactive  endplate change. Moderate facet hypertrophy. At least moderate spinal stenosis. Foramina appear grossly patent. L2-3: Degenerative intervertebral disc space narrowing with diffuse disc bulge and reactive endplate change. Moderate facet hypertrophy. Resultant severe spinal stenosis. Moderate right L2 foraminal narrowing. Left neural foramen remains patent. L3-4: Degenerative intervertebral disc space narrowing with diffuse disc bulge and endplate spurring. Moderate facet arthrosis. Resultant severe spinal stenosis. Foramina appear grossly patent. L4-5: Degenerative intervertebral disc space narrowing with diffuse disc bulge and reactive endplate spurring. Moderate facet hypertrophy. Moderate canal with severe left and moderate right lateral recess stenosis. Mild to moderate left L4 foraminal narrowing. Right neural foramen remains patent. L5-S1: Advanced degenerative intervertebral disc space narrowing with diffuse disc bulge and reactive endplate spurring. Moderate facet hypertrophy. Moderate left greater than right lateral recess stenosis. Central canal remains patent. No significant foraminal stenosis. IMPRESSION: 1. Severely motion degraded exam, markedly limiting assessment. 2. Fluid signal intensity with adjacent mild reactive endplate changes at the T12-L1, L1-2, L2-3, L3-4, and L4-5 interspaces. While these findings could be degenerative in nature, possible infection with osteomyelitis discitis is difficult to exclude on this severely motion degraded exam. Correlation with symptomatology and laboratory values recommended. 3. 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 Electronically Signed   By: Jeannine Boga M.D.   On: 02/23/2022 01:29   MR THORACIC SPINE WO CONTRAST  Result Date: 02/23/2022 CLINICAL DATA:  Initial evaluation for staph bacteremia. EXAM: MRI THORACIC SPINE WITHOUT CONTRAST  TECHNIQUE: Multiplanar, multisequence MR imaging of the thoracic spine was performed. No intravenous contrast was administered. COMPARISON:  Prior CT from 02/21/2022. FINDINGS: Alignment: Examination is severely degraded by motion artifact, markedly limiting assessment. Mild exaggeration of the normal thoracic kyphosis.  No listhesis. Vertebrae: Vertebral body height grossly maintained without acute or chronic fracture. Bone marrow signal intensity diffusely heterogeneous. 1 cm T1/T2 hyperintense lesion within the T1 vertebral body, likely a hemangioma. No definite worrisome osseous lesions. Fluid signal intensity with mild disc space widening and reactive endplate edema noted about the partially visualized C6-7 interspace (series 6, image 10). Suggestion of mild paraspinous edema within this region. While these findings could be degenerative, possible osteomyelitis discitis could also have this appearance. Otherwise, no other convincing evidence for acute infection elsewhere within the thoracic spine on this motion degraded exam. Cord: Grossly normal signal and morphology. No visible epidural collections. Paraspinal and other soft tissues: Probable mild paraspinous edema adjacent to the C6-7 interspace. No other visible paraspinous soft tissue abnormality. Layering bilateral pleural effusions with associated atelectasis and/or consolidation. Disc levels: Multilevel disc desiccation with mild noncompressive disc bulging and reactive endplate spurring seen throughout the thoracic spine. No significant or high-grade spinal stenosis. Foramina appear grossly patent. IMPRESSION: 1. Severely motion degraded exam. 2. Fluid signal intensity with reactive endplate in paraspinous edema about the partially visualized C6-7 interspace. While these findings could be degenerative in nature, possible osteomyelitis discitis could also have this appearance. Correlation with symptomatology and laboratory values recommended. Further  evaluation with dedicated MRI of the cervical spine could be performed  for further evaluation as the patient is able to tolerate. 3. No other convincing evidence for acute infection within the thoracic spine. 4. Layering bilateral pleural effusions with associated atelectasis and/or consolidation. Electronically Signed   By: Jeannine Boga M.D.   On: 02/23/2022 01:20   ECHOCARDIOGRAM COMPLETE  Result Date: 02/22/2022    ECHOCARDIOGRAM REPORT   Patient Name:   Darryl Diaz Date of Exam: 02/22/2022 Medical Rec #:  [id number]        Height:       72.0 in Accession #:    [id number]       Weight:       171.0 lb Date of Birth:  Jun 15, 1940        BSA:          1.993 m Patient Age:    10 years         BP:           110/43 mmHg Patient Gender: M                HR:           124 bpm. Exam Location:  Inpatient Procedure: 2D Echo, Cardiac Doppler and Color Doppler Indications:    elevated trops  History:        Patient has prior history of Echocardiogram examinations, most                 recent 01/17/2021. CAD, Arrythmias:Bradycardia; Risk                 Factors:Hypertension and Dyslipidemia.  Sonographer:    Melissa Morford RDCS (AE, PE) Referring Phys: [place]  1. Left ventricular ejection fraction, by estimation, is 60 to 65%. The left ventricle has normal function. The left ventricle has no regional wall motion abnormalities. Left ventricular diastolic parameters are consistent with Grade I diastolic dysfunction (impaired relaxation). Elevated left ventricular end-diastolic pressure. The E/e' is 25.  2. Right ventricular systolic function is normal. The right ventricular size is normal. There is normal pulmonary artery systolic pressure. The estimated right ventricular systolic pressure is 64.4 mmHg.  3. Left atrial size was moderately dilated.  4. The mitral valve is abnormal. Mild mitral valve regurgitation. Moderate mitral annular calcification.  5. The aortic valve is  tricuspid. Aortic valve regurgitation is not visualized. Mild aortic valve stenosis. Aortic valve area, by VTI measures 2.45 cm. Aortic valve mean gradient measures 12.5 mmHg. Aortic valve Vmax measures 2.18 m/s.  6. Aortic dilatation noted. There is borderline dilatation of the ascending aorta, measuring 39 mm.  7. The inferior vena cava is dilated in size with <50% respiratory variability, suggesting right atrial pressure of 15 mmHg.  8. Cannot exclude a small PFO. Comparison(s): Changes from prior study are noted. 01/17/2021: LVEF 60-65%, mild AS - mean gradient 12 mmHg. FINDINGS  Left Ventricle: Left ventricular ejection fraction, by estimation, is 60 to 65%. The left ventricle has normal function. The left ventricle has no regional wall motion abnormalities. The left ventricular internal cavity size was normal in size. There is  no left ventricular hypertrophy. Left ventricular diastolic parameters are consistent with Grade I diastolic dysfunction (impaired relaxation). Elevated left ventricular end-diastolic pressure. The E/e' is 15. Right Ventricle: The right ventricular size is normal. No increase in right ventricular wall thickness. Right ventricular systolic function is normal. There is normal pulmonary artery systolic pressure. The tricuspid regurgitant velocity is 1.63 m/s, and  with an assumed right atrial  pressure of 15 mmHg, the estimated right ventricular systolic pressure is 70.9 mmHg. Left Atrium: Left atrial size was moderately dilated. Right Atrium: Right atrial size was normal in size. Pericardium: There is no evidence of pericardial effusion. Mitral Valve: The mitral valve is abnormal. There is moderate calcification of the posterior mitral valve leaflet(s). Moderate mitral annular calcification. Mild mitral valve regurgitation. Tricuspid Valve: The tricuspid valve is grossly normal. Tricuspid valve regurgitation is trivial. Aortic Valve: The aortic valve is tricuspid. Aortic valve  regurgitation is not visualized. Mild aortic stenosis is present. Aortic valve mean gradient measures 12.5 mmHg. Aortic valve peak gradient measures 19.0 mmHg. Aortic valve area, by VTI measures 2.45 cm. Pulmonic Valve: The pulmonic valve was grossly normal. Pulmonic valve regurgitation is trivial. Aorta: Aortic dilatation noted. There is borderline dilatation of the ascending aorta, measuring 39 mm. Venous: The inferior vena cava is dilated in size with less than 50% respiratory variability, suggesting right atrial pressure of 15 mmHg. IAS/Shunts: Cannot exclude a small PFO.  LEFT VENTRICLE PLAX 2D LVIDd:         5.33 cm      Diastology LVIDs:         4.13 cm      LV e' medial:    8.92 cm/s LV PW:         0.93 cm      LV E/e' medial:  13.9 LV IVS:        0.87 cm      LV e' lateral:   6.64 cm/s LVOT diam:     2.10 cm      LV E/e' lateral: 18.7 LV SV:         108 LV SV Index:   54 LVOT Area:     3.46 cm  LV Volumes (MOD) LV vol d, MOD A2C: 143.0 ml LV vol d, MOD A4C: 162.0 ml LV vol s, MOD A2C: 70.6 ml LV vol s, MOD A4C: 65.8 ml LV SV MOD A2C:     72.4 ml LV SV MOD A4C:     162.0 ml LV SV MOD BP:      88.1 ml RIGHT VENTRICLE RV S prime:     11.30 cm/s TAPSE (M-mode): 2.6 cm LEFT ATRIUM             Index        RIGHT ATRIUM           Index LA diam:        4.20 cm 2.11 cm/m   RA Area:     14.50 cm LA Vol (A2C):   77.0 ml 38.63 ml/m  RA Volume:   34.20 ml  17.16 ml/m LA Vol (A4C):   87.3 ml 43.80 ml/m LA Biplane Vol: 87.7 ml 44.00 ml/m  AORTIC VALVE AV Area (Vmax):    2.38 cm AV Area (Vmean):   2.31 cm AV Area (VTI):     2.45 cm AV Vmax:           218.00 cm/s AV Vmean:          167.000 cm/s AV VTI:            0.440 m AV Peak Grad:      19.0 mmHg AV Mean Grad:      12.5 mmHg LVOT Vmax:         150.00 cm/s LVOT Vmean:        111.500 cm/s LVOT VTI:  0.310 m LVOT/AV VTI ratio: 0.71  AORTA Ao Root diam: 3.60 cm Ao Asc diam:  3.90 cm MITRAL VALVE                TRICUSPID VALVE MV Area (PHT): 4.80 cm     TR  Peak grad:   10.6 mmHg MV Decel Time: 158 msec     TR Vmax:        163.00 cm/s MV E velocity: 124.00 cm/s MV A velocity: 121.00 cm/s  SHUNTS MV E/A ratio:  1.02         Systemic VTI:  0.31 m                             Systemic Diam: 2.10 cm Lyman Bishop MD Electronically signed by Lyman Bishop MD Signature Date/Time: 02/22/2022/11:01:26 AM    Final    DG Foot 2 Views Left  Result Date: 02/22/2022 CLINICAL DATA:  Cellulitis EXAM: LEFT FOOT - 2 VIEW COMPARISON:  Radiographs 11/02/2021; MRI 06/08/2021; radiographs 06/07/2021 FINDINGS: 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 Osseous destruction at the base of the second proximal phalanx in the head of the second metatarsal consistent with osteomyelitis. Question osteo myelitis of the medial aspect of the fourth metatarsal head versus projection artifact. Remote posttraumatic deformity about the head of the third metatarsal. 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 Postsurgical changes about the ankle. 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 Question osteomyelitis of the medial aspect of the fourth metatarsal head. Electronically Signed   By: Placido Sou M.D.   On: 02/22/2022 02:04   DG Chest 1 View  Result Date: 02/21/2022 CLINICAL DATA:  Weakness EXAM: CHEST  1 VIEW COMPARISON:  02/20/2022 FINDINGS: Stable cardiomediastinal contours. Slightly low lung volumes with bibasilar atelectasis. Trace effusions. No pneumothorax. Advanced degenerative changes of the left glenohumeral joint. IMPRESSION: Bibasilar atelectasis and trace bilateral pleural effusions. Electronically Signed   By: Davina Poke D.O.   On: 02/21/2022 20:42   CT CHEST ABDOMEN PELVIS WO CONTRAST  Result Date: 02/21/2022 CLINICAL DATA:  81 year old male with history of unintended weight loss and fever. EXAM: CT CHEST, ABDOMEN AND PELVIS WITHOUT CONTRAST TECHNIQUE: Multidetector CT imaging of the chest, abdomen and pelvis was performed  following the standard protocol without IV contrast. RADIATION DOSE REDUCTION: This exam was performed according to the departmental dose-optimization program which includes automated exposure control, adjustment of the mA and/or kV according to patient size and/or use of iterative reconstruction technique. COMPARISON:  CT of the abdomen and pelvis 11/19/2021. No prior chest CT. FINDINGS: CT CHEST FINDINGS Cardiovascular: Heart size is normal. There is no significant pericardial fluid, thickening or pericardial calcification. There is aortic atherosclerosis, as well as atherosclerosis of the great vessels of the mediastinum and the coronary arteries, including calcified atherosclerotic plaque in the left main, left anterior descending, left circumflex and right coronary arteries. Mild calcifications of the mitral annulus. Moderate calcifications of the aortic valve. Mediastinum/Nodes: No pathologically enlarged mediastinal or hilar lymph nodes. Please note that accurate exclusion of hilar adenopathy is limited on noncontrast CT scans. Esophagus is unremarkable in appearance. No axillary lymphadenopathy. Lungs/Pleura: Trace bilateral pleural effusions lying dependently with minimal passive subsegmental atelectasis in the lower lobes of the lungs bilaterally. There is some very mild patchy areas of ground-glass attenuation in the right upper lobe, nonspecific, but potentially of infectious or inflammatory etiology. No confluent consolidative airspace disease. No  definite suspicious appearing pulmonary nodules or masses are noted. Musculoskeletal: Multiple old healed right-sided rib fractures are incidentally noted. There are no aggressive appearing lytic or blastic lesions noted in the visualized portions of the skeleton. CT ABDOMEN PELVIS FINDINGS Hepatobiliary: Liver has a shrunken appearance and nodular contour, indicative of advanced cirrhosis. No discrete cystic or solid hepatic lesions are confidently identified  on today's noncontrast CT examination. Multiple tiny calcified gallstones lie dependently in the gallbladder. Gallbladder is moderately distended. Gallbladder wall does not appear thickened or edematous. No definite pericholecystic fluid. Pancreas: No definite pancreatic mass or peripancreatic fluid collections or inflammatory changes are noted on today's noncontrast CT examination. Spleen: Spleen appears mildly enlarged measuring 11.5 x 7.7 x 12.1 cm (estimated splenic volume of 536 mL) . Adrenals/Urinary Tract: Mild bilateral renal atrophy. In the lower pole of the left kidney there is a partially exophytic low-attenuation 1.7 cm lesion (axial image 78 of series 3) which is incompletely characterized on today's noncontrast CT examination, but similar to prior studies and statistically likely a small cysts (no imaging follow-up is recommended). Unenhanced appearance of the right kidney and bilateral adrenal glands is otherwise unremarkable. No hydroureteronephrosis. Urinary bladder is largely obscured by beam hardening artifact from the patient's bilateral hip arthroplasties. Stomach/Bowel: Unenhanced appearance of the stomach is normal. There is no pathologic dilatation of small bowel or colon. A few scattered colonic diverticuli are noted, without definite focal surrounding inflammatory changes to indicate an acute diverticulitis at this time. Normal appendix. Vascular/Lymphatic: Atherosclerotic calcifications throughout the abdominal aorta and pelvic vasculature. Portal vein appears mildly dilated measuring 17 mm in the porta hepatis. No lymphadenopathy noted in the abdomen or pelvis. Reproductive: Prostate gland and seminal vesicles are obscured by beam hardening artifact. Other: No significant volume of ascites.  No pneumoperitoneum. Musculoskeletal: There are no aggressive appearing lytic or blastic lesions noted in the visualized portions of the skeleton. 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 IMPRESSION: 1. No definite findings in the chest, abdomen or pelvis to account for the patient's unexplained weight loss. There are very subtle areas of mild ground-glass attenuation in the right upper lobe which are nonspecific, but potentially of infectious or inflammatory etiology. 2. Trace bilateral pleural effusions lying dependently. 3. Cholelithiasis without evidence of acute cholecystitis. 4. 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 5. Colonic diverticulosis without evidence of acute diverticulitis at this time. 6. Aortic atherosclerosis, in addition to left main and three-vessel coronary artery disease. 7. There are calcifications of the aortic valve and mitral annulus. Echocardiographic correlation for evaluation of potential valvular dysfunction may be warranted if clinically indicated. 8. Additional incidental findings, as above. Electronically Signed   By: Vinnie Langton M.D.   On: 02/21/2022 06:26   DG Chest 2 View  Result Date: 02/20/2022 CLINICAL DATA:  Fall. EXAM: CHEST - 2 VIEW COMPARISON:  Chest x-ray 01/25/2022 FINDINGS: The heart size and mediastinal contours are within normal limits. There is minimal atelectasis in the lung bases. Lung volumes are low. Visualized skeletal structures are unremarkable. IMPRESSION: No active cardiopulmonary disease. Electronically Signed   By: Ronney Asters M.D.   On: 02/20/2022 23:07   DG Pelvis Portable  Result Date: 02/20/2022 CLINICAL DATA:  Fall. EXAM: PORTABLE PELVIS 1-2 VIEWS COMPARISON:  06/05/2021. FINDINGS: There is no evidence of pelvic fracture or diastasis. Bilateral hip arthroplasty changes are noted. There is no evidence of hardware loosening. Degenerative changes are noted in  the lower lumbar spine. IMPRESSION: No acute fracture or dislocation. Electronically Signed   By: Brett Fairy M.D.   On: 02/20/2022 23:05   CT Head  Wo Contrast  Result Date: 02/20/2022 CLINICAL DATA:  Fall EXAM: CT HEAD WITHOUT CONTRAST CT CERVICAL SPINE WITHOUT CONTRAST TECHNIQUE: Multidetector CT imaging of the head and cervical spine was performed following the standard protocol without intravenous contrast. Multiplanar CT image reconstructions of the cervical spine were also generated. RADIATION DOSE REDUCTION: This exam was performed according to the departmental dose-optimization program which includes automated exposure control, adjustment of the mA and/or kV according to patient size and/or use of iterative reconstruction technique. COMPARISON:  01/31/2022 FINDINGS: Motion degraded images. CT HEAD FINDINGS Brain: No evidence of acute infarction, hemorrhage, hydrocephalus, extra-axial collection or mass lesion/mass effect. Global cortical and central atrophy. Subcortical white matter and periventricular small vessel ischemic changes. Vascular: Intracranial atherosclerosis. Skull: Normal. Negative for fracture or focal lesion. Sinuses/Orbits: The visualized paranasal sinuses are essentially clear. The mastoid air cells are unopacified. Other: None. CT CERVICAL SPINE FINDINGS Alignment: Straightening the cervical spine, likely positional. Skull base and vertebrae: Stable fracture involving the posterior aspect of the right superior articular facet of C1 (series 3/image 13). No new/acute fracture. Soft tissues and spinal canal: No prevertebral fluid or swelling. No visible canal hematoma. Disc levels: Moderate degenerative changes of the lower cervical spine. Spinal canal is patent. Upper chest: Visualized lung apices are essentially clear. Other: Visualized thyroid is grossly unremarkable. IMPRESSION: No acute intracranial abnormality. Atrophy with small vessel ischemic changes. Stable fracture involving the posterior aspect of the right superior articular facet of C1. No new/acute traumatic injury to the cervical spine. Electronically Signed   By:  Julian Hy M.D.   On: 02/20/2022 22:48   CT Cervical Spine Wo Contrast  Result Date: 02/20/2022 CLINICAL DATA:  Fall EXAM: CT HEAD WITHOUT CONTRAST CT CERVICAL SPINE WITHOUT CONTRAST TECHNIQUE: Multidetector CT imaging of the head and cervical spine was performed following the standard protocol without intravenous contrast. Multiplanar CT image reconstructions of the cervical spine were also generated. RADIATION DOSE REDUCTION: This exam was performed according to the departmental dose-optimization program which includes automated exposure control, adjustment of the mA and/or kV according to patient size and/or use of iterative reconstruction technique. COMPARISON:  01/31/2022 FINDINGS: Motion degraded images. CT HEAD FINDINGS Brain: No evidence of acute infarction, hemorrhage, hydrocephalus, extra-axial collection or mass lesion/mass effect. Global cortical and central atrophy. Subcortical white matter and periventricular small vessel ischemic changes. Vascular: Intracranial atherosclerosis. Skull: Normal. Negative for fracture or focal lesion. Sinuses/Orbits: The visualized paranasal sinuses are essentially clear. The mastoid air cells are unopacified. Other: None. CT CERVICAL SPINE FINDINGS Alignment: Straightening the cervical spine, likely positional. Skull base and vertebrae: Stable fracture involving the posterior aspect of the right superior articular facet of C1 (series 3/image 13). No new/acute fracture. Soft tissues and spinal canal: No prevertebral fluid or swelling. No visible canal hematoma. Disc levels: Moderate degenerative changes of the lower cervical spine. Spinal canal is patent. Upper chest: Visualized lung apices are essentially clear. Other: Visualized thyroid is grossly unremarkable. IMPRESSION: No acute intracranial abnormality. Atrophy with small vessel ischemic changes. Stable fracture involving the posterior aspect of the right superior articular facet of C1. No new/acute  traumatic injury to the cervical spine. Electronically Signed   By: Julian Hy M.D.   On: 02/20/2022 22:48   CT Head Wo Contrast  Result Date: 01/31/2022 CLINICAL DATA:  Head trauma.  Status post  fall. EXAM: CT HEAD WITHOUT CONTRAST CT CERVICAL SPINE WITHOUT CONTRAST TECHNIQUE: Multidetector CT imaging of the head and cervical spine was performed following the standard protocol without intravenous contrast. Multiplanar CT image reconstructions of the cervical spine were also generated. RADIATION DOSE REDUCTION: This exam was performed according to the departmental dose-optimization program which includes automated exposure control, adjustment of the mA and/or kV according to patient size and/or use of iterative reconstruction technique. COMPARISON:  01/21/2022 FINDINGS: CT HEAD FINDINGS Brain: No evidence of acute infarction, hemorrhage, hydrocephalus, extra-axial collection or mass lesion/mass effect. There is mild diffuse low-attenuation within the subcortical and periventricular white matter compatible with chronic microvascular disease. Prominence of the sulci and ventricles compatible with brain atrophy. Vascular: No hyperdense vessel or unexpected calcification. Skull: Normal. Negative for fracture or focal lesion. Sinuses/Orbits: Paranasal sinuses and mastoid air cells are clear. The orbits are unremarkable. Other: Left frontal scalp hematoma measures 0.8 x 2.5 cm, image 14/4. CT CERVICAL SPINE FINDINGS Alignment: No signs of acute posttraumatic mile alignment of the cervical spine. There is reversal of normal cervical lordosis which may reflect patient positioning or muscle spasm. Skull base and vertebrae: Stable appearance of minimally displaced fracture involving the C1 right superior articular process where it contacts the occipital condyle image 63/12. stable from the prior exam. Soft tissues and spinal canal: No prevertebral fluid or swelling. No visible canal hematoma. Disc levels: There is  multi level disc space narrowing and endplate spurring identified compatible with degenerative disc disease. Upper chest: Negative. Other: None IMPRESSION: 1. No acute intracranial abnormality. 2. Left frontal scalp hematoma without evidence for underlying skull fracture. 3. Chronic microvascular disease and brain atrophy. 4. No evidence for acute cervical spine fracture or subluxation. 5. Stable appearance of minimally displaced fracture involving the right superior articular process of C1. 6. Cervical degenerative disc disease. Electronically Signed   By: Kerby Moors M.D.   On: 01/31/2022 22:21   CT Cervical Spine Wo Contrast  Result Date: 01/31/2022 CLINICAL DATA:  Head trauma.  Status post fall. EXAM: CT HEAD WITHOUT CONTRAST CT CERVICAL SPINE WITHOUT CONTRAST TECHNIQUE: Multidetector CT imaging of the head and cervical spine was performed following the standard protocol without intravenous contrast. Multiplanar CT image reconstructions of the cervical spine were also generated. RADIATION DOSE REDUCTION: This exam was performed according to the departmental dose-optimization program which includes automated exposure control, adjustment of the mA and/or kV according to patient size and/or use of iterative reconstruction technique. COMPARISON:  01/21/2022 FINDINGS: CT HEAD FINDINGS Brain: No evidence of acute infarction, hemorrhage, hydrocephalus, extra-axial collection or mass lesion/mass effect. There is mild diffuse low-attenuation within the subcortical and periventricular white matter compatible with chronic microvascular disease. Prominence of the sulci and ventricles compatible with brain atrophy. Vascular: No hyperdense vessel or unexpected calcification. Skull: Normal. Negative for fracture or focal lesion. Sinuses/Orbits: Paranasal sinuses and mastoid air cells are clear. The orbits are unremarkable. Other: Left frontal scalp hematoma measures 0.8 x 2.5 cm, image 14/4. CT CERVICAL SPINE FINDINGS  Alignment: No signs of acute posttraumatic mile alignment of the cervical spine. There is reversal of normal cervical lordosis which may reflect patient positioning or muscle spasm. Skull base and vertebrae: Stable appearance of minimally displaced fracture involving the C1 right superior articular process where it contacts the occipital condyle image 63/12. stable from the prior exam. Soft tissues and spinal canal: No prevertebral fluid or swelling. No visible canal hematoma. Disc levels: There is multi level disc space narrowing and endplate spurring  identified compatible with degenerative disc disease. Upper chest: Negative. Other: None IMPRESSION: 1. No acute intracranial abnormality. 2. Left frontal scalp hematoma without evidence for underlying skull fracture. 3. Chronic microvascular disease and brain atrophy. 4. No evidence for acute cervical spine fracture or subluxation. 5. Stable appearance of minimally displaced fracture involving the right superior articular process of C1. 6. Cervical degenerative disc disease. Electronically Signed   By: Kerby Moors M.D.   On: 01/31/2022 22:21   DG Chest Port 1 View  Result Date: 01/25/2022 CLINICAL DATA:  End-stage renal disease. EXAM: PORTABLE CHEST 1 VIEW COMPARISON:  Radiographs 01/03/2022 and 12/19/2021. FINDINGS: 1554 hours. The heart size and mediastinal contours are stable. There are lower lung volumes with mild resulting bibasilar atelectasis. No edema, confluent airspace opacity or pneumothorax. There may be a small amount of pleural fluid on the left. A superiorly projecting catheter projects over the superior cavoatrial junction, presumably a femorally placed central venous catheter. Telemetry leads overlie the chest. Old right-sided rib fractures are noted. IMPRESSION: 1. Presumed central venous catheter projecting over the superior cavoatrial junction. 2. Bibasilar atelectasis. Electronically Signed   By: Richardean Sale M.D.   On: 01/25/2022 16:04    DG C-Arm 1-60 Min-No Report  Result Date: 01/25/2022 Fluoroscopy was utilized by the requesting physician.  No radiographic interpretation.      Terri Piedra, NP [facility]  02/23/2022  10:38 AM

## 2022-02-23 NOTE — Progress Notes (Signed)
Physical Therapy Evaluation Patient Details Name: Darryl Diaz MRN: [id number] DOB: 01/06/1941 Today's Date: 02/23/2022  History of Present Illness  Pt is an 81 y/o M presented from SNF for decreased PO Intake, swelling and redness of the left foot.  Recent admit 9/15 after fall at home with C28f, continues to wear C-collar. PMH includes alcohol abuse, ESRD on HD MWF, CHF, HTN, and HLD.  Clinical Impression  Pt was seen for mobility on side of bed and was unable to assist with attempt to stand.  Pt is weaker and has lower endurance, but previously was walking hallway distances and will reattempt this.  Pt is confused, forgot his son had just been in to visit, and so will reinforce his reminders for quality of movement, safety and control of balance.  Pt is  very motivated to return to higher level of independence.       Recommendations for follow up therapy are one component of a multi-disciplinary discharge planning process, led by the attending physician.  Recommendations may be updated based on patient status, additional functional criteria and insurance authorization.  Follow Up Recommendations Skilled nursing-short term rehab (<3 hours/day) Can patient physically be transported by private vehicle: No    Assistance Recommended at Discharge Frequent or constant Supervision/Assistance  Patient can return home with the following  Two people to help with walking and/or transfers;A lot of help with bathing/dressing/bathroom;Assistance with cooking/housework;Direct supervision/assist for medications management;Direct supervision/assist for financial management;Assist for transportation;Help with stairs or ramp for entrance    Equipment Recommendations None recommended by PT  Recommendations for Other Services       Functional Status Assessment Patient has had a recent decline in their functional status and demonstrates the ability to make significant improvements in function in a  reasonable and predictable amount of time.     Precautions / Restrictions Precautions Precautions: Cervical;Fall Precaution Booklet Issued: No Precaution Comments: bilateral mitts Restrictions Weight Bearing Restrictions: No      Mobility  Bed Mobility Overal bed mobility: Needs Assistance Bed Mobility: Supine to Sit, Sit to Supine     Supine to sit: Max assist Sit to supine: Max assist   General bed mobility comments: pt requires a lot of time and dense cues for pt to grant permission to sequence and help him    Transfers Overall transfer level: Needs assistance                 General transfer comment: could not safely get pt to assist and attempt, actively resists    Ambulation/Gait               General Gait Details: unsafe to attempt  Stairs            Wheelchair Mobility    Modified Rankin (Stroke Patients Only)       Balance Overall balance assessment: Needs assistance, History of Falls Sitting-balance support: Feet supported Sitting balance-Leahy Scale: Fair         Standing balance comment: could not get pt to assist with trying to stand                             Pertinent Vitals/Pain Pain Assessment Pain Assessment: Faces Faces Pain Scale: Hurts whole lot Pain Location: back with rotation even as a "log" to get off bed Pain Descriptors / Indicators: Guarding, Grimacing, Aching Pain Intervention(s): Limited activity within patient's tolerance, Monitored during session, Repositioned  Home Living Family/patient expects to be discharged to:: Skilled nursing facility                   Additional Comments: plan for [facility]    Prior Function Prior Level of Function : History of Falls (last six months)             Mobility Comments: rollator with many falls including noted at previous admits ADLs Comments: ALF is plan but son not there to verify timing     Hand Dominance   Dominant Hand:  Right    Extremity/Trunk Assessment   Upper Extremity Assessment Upper Extremity Assessment: Defer to OT evaluation    Lower Extremity Assessment Lower Extremity Assessment: Generalized weakness (proximal weakness is main issue)    Cervical / Trunk Assessment Cervical / Trunk Assessment: Kyphotic  Communication   Communication: No difficulties  Cognition Arousal/Alertness: Lethargic Behavior During Therapy: Flat affect Overall Cognitive Status: No family/caregiver present to determine baseline cognitive functioning                                 General Comments: pt may not be at baseline cognitively        General Comments General comments (skin integrity, edema, etc.): Pt was noted to keep O2 sats in 95% or greater range to move, but also has a lot of back degenerative changes that he will not allow PT to support and steady to move    Exercises     Assessment/Plan    PT Assessment Patient needs continued PT services  PT Problem List Decreased strength;Decreased activity tolerance;Decreased range of motion;Decreased balance;Decreased mobility;Decreased coordination;Decreased cognition;Decreased safety awareness;Cardiopulmonary status limiting activity;Decreased skin integrity;Pain       PT Treatment Interventions DME instruction;Gait training;Functional mobility training;Therapeutic activities;Therapeutic exercise;Balance training;Neuromuscular re-education;Patient/family education    PT Goals (Current goals can be found in the Care Plan section)  Acute Rehab PT Goals Patient Stated Goal: to get back to bed and comfortable PT Goal Formulation: Patient unable to participate in goal setting Time For Goal Achievement: 04/08/22 Potential to Achieve Goals: Fair    Frequency Min 3X/week     Co-evaluation               AM-PAC PT "6 Clicks" Mobility  Outcome Measure Help needed turning from your back to your side while in a flat bed without using  bedrails?: A Lot Help needed moving from lying on your back to sitting on the side of a flat bed without using bedrails?: A Lot Help needed moving to and from a bed to a chair (including a wheelchair)?: A Lot Help needed standing up from a chair using your arms (e.g., wheelchair or bedside chair)?: Total Help needed to walk in hospital room?: Total Help needed climbing 3-5 steps with a railing? : Total 6 Click Score: 9    End of Session Equipment Utilized During Treatment: Gait belt Activity Tolerance: Patient limited by fatigue;Treatment limited secondary to medical complications (Comment);Patient limited by pain Patient left: in bed;with call bell/phone within reach;with bed alarm set;Other (comment) (OT had arrived) Nurse Communication: Mobility status;Other (comment) (pain complaints and notes about his memory) PT Visit Diagnosis: Muscle weakness (generalized) (M62.81);History of falling (Z91.81);Difficulty in walking, not elsewhere classified (R26.2);Pain Pain - Right/Left:  (center) Pain - part of body:  (back and neck)    Time: 7829-5621 PT Time Calculation (min) (ACUTE ONLY): 30 min   Charges:  PT Evaluation $PT Eval Moderate Complexity: 1 Mod PT Treatments $Therapeutic Activity: 8-22 mins       Ramond Dial 02/23/2022, 5:21 PM  Mee Hives, PT PhD Acute Rehab Dept. Number: [place] [facility] [place]

## 2022-02-24 DIAGNOSIS — K7030 Alcoholic cirrhosis of liver without ascites: Secondary | ICD-10-CM | POA: Diagnosis not present

## 2022-02-24 DIAGNOSIS — L03116 Cellulitis of left lower limb: Secondary | ICD-10-CM | POA: Diagnosis not present

## 2022-02-24 DIAGNOSIS — R7881 Bacteremia: Secondary | ICD-10-CM | POA: Diagnosis present

## 2022-02-24 DIAGNOSIS — A419 Sepsis, unspecified organism: Secondary | ICD-10-CM | POA: Diagnosis not present

## 2022-02-24 DIAGNOSIS — R7989 Other specified abnormal findings of blood chemistry: Secondary | ICD-10-CM | POA: Diagnosis not present

## 2022-02-24 LAB — CULTURE, BLOOD (ROUTINE X 2)
Special Requests: ADEQUATE
Special Requests: ADEQUATE

## 2022-02-24 MED ORDER — POLYETHYLENE GLYCOL 3350 17 G PO PACK
17.0000 g | PACK | Freq: Every day | ORAL | Status: AC
  Administered 2022-02-24: 17 g via ORAL
  Filled 2022-02-24: qty 1

## 2022-02-24 MED ORDER — CARVEDILOL 3.125 MG PO TABS
3.1250 mg | ORAL_TABLET | Freq: Two times a day (BID) | ORAL | Status: DC
  Administered 2022-02-24 – 2022-03-06 (×17): 3.125 mg via ORAL
  Filled 2022-02-24 (×18): qty 1

## 2022-02-24 MED ORDER — LIDOCAINE HCL (PF) 1 % IJ SOLN: 5.0000 mL | INTRAMUSCULAR | Status: DC | PRN

## 2022-02-24 MED ORDER — DARBEPOETIN ALFA 200 MCG/0.4ML IJ SOSY
200.0000 ug | PREFILLED_SYRINGE | INTRAMUSCULAR | Status: DC
  Administered 2022-02-24: 200 ug via INTRAVENOUS
  Filled 2022-02-24: qty 0.4

## 2022-02-24 MED ORDER — LIDOCAINE-PRILOCAINE 2.5-2.5 % EX CREA: 1.0000 | TOPICAL_CREAM | CUTANEOUS | Status: DC | PRN

## 2022-02-24 MED ORDER — CARVEDILOL 3.125 MG PO TABS: 6.2500 mg | ORAL_TABLET | Freq: Two times a day (BID) | ORAL | Status: DC

## 2022-02-24 MED ORDER — CHLORHEXIDINE GLUCONATE CLOTH 2 % EX PADS
6.0000 | MEDICATED_PAD | Freq: Every day | CUTANEOUS | Status: DC
  Administered 2022-02-25: 6 via TOPICAL

## 2022-02-24 MED ORDER — DARBEPOETIN ALFA 200 MCG/0.4ML IJ SOSY: 200.0000 ug | PREFILLED_SYRINGE | INTRAMUSCULAR | Status: DC

## 2022-02-24 MED ORDER — ENSURE ENLIVE PO LIQD
237.0000 mL | Freq: Two times a day (BID) | ORAL | Status: DC
  Administered 2022-02-26 – 2022-03-06 (×8): 237 mL via ORAL

## 2022-02-24 MED ORDER — PENTAFLUOROPROP-TETRAFLUOROETH EX AERO: 1.0000 | INHALATION_SPRAY | CUTANEOUS | Status: DC | PRN

## 2022-02-24 NOTE — Progress Notes (Signed)
[facility] PROGRESS NOTE    Progress Note  RUFFIN LADA  KPT:[id number] DOB: Sep 24, 1940 DOA: 02/21/2022 PCP: [name], MD     Brief Narrative:   RAUNAK ANTUNA is an 81 y.o. 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 who resides in a nursing home found to have decreased oral intake and weakness imaging showed possible pneumonia but had left foot cellulitis with evidence of osteomyelitis, blood cultures were positive for GPC in clusters was started empirically on IV vancomycin cefepime and Doxy ID was consulted and was transitioned to IV cefazolin    Assessment/Plan:   Sepsis due to gram-positive bacteremia in the setting discitis and osteomyelitis of C6-C7, left foot cellulitis and left foot osteomyelitis: Blood cultures in 02/20/2022 and 02/21/2022 showed MSSA bacteremia Initially started on IV empiric antibiotics, ID was consulted now has been transitioned to IV cefazolin. Tmax of 98.6. MRI of the C-spine showed discitis and osteomyelitis with possible abscess C6-C7 I do believe he does not have pneumonia there was no cough or shortness of breath on admission. MRI of the thoracic and lumbar spine did not show discitis or osteomyelitis. Neurosurgery was consulted. Recommended no surgical intervention Cardiology was consulted for TEE they will attempt on 02/25/2022. Physical therapy evaluated the patient, he will need short-term rehab.  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 Sharol Given has been notified for left foot osteomyelitis. Currently on empiric antibiotics. Ortho relates the foot does not appear to be the cause of sepsis.  Hypokalemia: Nephrology has been consulted, further management per hemodialysis.  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 Recheck a hemoglobin on 02/25/2022.  C1 articular facet fracture: Currently in a c-collar. Cardiology will proceed to attempt TEE  Elevated troponins: In the setting of sepsis and end-stage renal disease, likely demand ischemia he denies any chest pain twelve-lead EKG showed no signs of ischemia.  Mild aortic stenosis: Mean gradient of 12 continue to monitor as an outpatient.  Mild hyponatremia: Further management per renal with dialysis.  Diarrhea: Has had no further diarrhea in house.  Paroxysmal atrial fibrillation: On anticoagulation rate control continue amiodarone.  Essential hypertension: Likely due to sepsis permissive hypertension was allowed his blood pressure has remained relatively stable. Blood pressure is trending up we will start Coreg.  Hyperlipidemia: Continue statins.  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  Prolonged QTc: Noted continue to monitor on telemetry.    DVT prophylaxis: lovenox Family Communication:none Status is: Inpatient Remains inpatient appropriate because: Likely back to SNF after infectious work-up and treatment has been completed.    Code Status:     Code Status Orders  (From admission, onward)           Start     Ordered   02/22/22 0243  Do not attempt resuscitation (DNR)  Continuous       Question Answer Comment  In the event of cardiac or respiratory ARREST Do not call a "code blue"   In the event of cardiac or respiratory ARREST Do not perform Intubation, CPR, defibrillation or ACLS   In the event of cardiac or respiratory ARREST Use medication by any route, position, wound care, and other  measures to relive pain and suffering. May use oxygen, suction and manual treatment of airway obstruction as needed for comfort.      02/22/22  0242           Code Status History     Date Active Date Inactive Code Status Order ID Comments User Context   01/21/2022 0603 01/27/2022 1811 DNR [id number]  Shela Leff, MD ED   11/24/2021 0007 11/26/2021 2014 DNR [id number]  Rhetta Mura, DO ED   09/27/2021 2202 09/30/2021 2305 DNR [id number]  Rhetta Mura, DO ED   06/07/2021 2050 06/14/2021 2022 DNR [id number]  Rhetta Mura, DO ED   05/02/2021 0353 05/02/2021 2111 DNR [id number]  Elwyn Reach, MD Inpatient   01/17/2021 0623 01/22/2021 2354 DNR [id number]  Reubin Milan, MD ED   01/06/2021 2258 01/09/2021 2208 DNR [id number]  Lenore Cordia, MD ED   12/02/2020 1537 12/04/2020 1641 Full Code [id number]  Karmen Bongo, MD ED   10/13/2020 2222 10/17/2020 2016 Full Code [id number]  Chotiner, Yevonne Aline, MD Inpatient   09/07/2020 2215 09/13/2020 2142 Full Code [id number]  Lenore Cordia, MD ED   03/16/2020 0811 03/20/2020 1908 Full Code [id number]  Karmen Bongo, MD ED   03/06/2020 0100 03/11/2020 1501 Full Code [id number]  Cathlyn Parsons, PA-C Inpatient   03/06/2020 0100 03/06/2020 0100 Full Code [id number]  Cathlyn Parsons, PA-C Inpatient   02/22/2020 1704 03/06/2020 0029 Full Code [id number]  Freada Bergeron, MD ED   02/05/2020 2210 02/08/2020 0218 Full Code [id number]  Barrett, Evelene Croon, PA-C ED   10/15/2019 1204 10/16/2019 1922 Full Code [id number]  Norman Herrlich Inpatient   04/20/2019 1844 04/23/2019 1723 Full Code [id number]  Donita Brooks, NP ED   02/25/2019 2021 03/05/2019 1432 Full Code [id number]  Shela Leff, MD ED   08/11/2018 1707 08/12/2018 1446 Full Code [id number]  Barb Merino, MD Inpatient   04/30/2016 0253 05/04/2016 1828 Full Code [id number]  Wylene Simmer, MD Inpatient   03/30/2016 1347 04/04/2016 1440 Full Code [id number]  Rondel Jumbo, PA-C ED   03/30/2016 1347 03/30/2016 1347 Full Code [id number]  Rondel Jumbo, PA-C ED         IV Access:   Peripheral IV   Procedures and diagnostic  studies:   MR CERVICAL SPINE WO CONTRAST  Result Date: 02/23/2022 CLINICAL DATA:  Rule out discitis osteomyelitis.  MSSA bacteremia. EXAM: MRI CERVICAL SPINE WITHOUT CONTRAST TECHNIQUE: Multiplanar, multisequence MR imaging of the cervical spine was performed. No intravenous contrast was administered. COMPARISON:  MRI thoracic spine 02/22/2022 FINDINGS: Alignment: Image quality degraded by motion Mild retrolisthesis C5-6. Vertebrae: Hemangioma T1 vertebral body.  Negative for fracture Prominent fluid in the disc space at C6-7 with mild edema in the adjacent endplates. Mild prevertebral fluid also present. Cord: Cord evaluation limited given the degree of motion. No cord compression or cord lesion identified. Posterior Fossa, vertebral arteries, paraspinal tissues: Prevertebral edema is present. No paraspinous fluid collection. No mass or adenopathy Disc levels: C2-3: Mild disc and facet degeneration. Mild foraminal narrowing bilaterally. C3-4: Disc and facet degeneration. Mild central canal stenosis and mild foraminal narrowing bilaterally due to spurring C4-5: Disc and facet degeneration. Mild central canal stenosis and mild foraminal narrowing bilaterally due to spurring C5-6: Disc and facet degeneration. Mild spinal stenosis and mild foraminal narrowing bilaterally due to spurring C6-7: Large amount of fluid in the disc space with edema in the adjacent endplates. Fluid appears contiguous with prevertebral fluid. Findings are  suspicious for discitis and osteomyelitis. No epidural abscess. Right-sided disc and osteophyte complex flattening the cord. There is moderate right foraminal narrowing and mild left foraminal narrowing. Mild to moderate central canal stenosis. C7-T1: Negative for stenosis. IMPRESSION: 1. Image quality degraded by motion. 2. Large amount of fluid in the disc space at C6-7 with edema in the adjacent endplates and mild prevertebral fluid. Findings are suspicious for discitis and  osteomyelitis. No epidural abscess. Right-sided disc and osteophyte complex causing cord deformity and moderate right foraminal narrowing. Mild to moderate spinal stenosis. 3. 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 Electronically Signed   By: Franchot Gallo M.D.   On: 02/23/2022 11:26   MR LUMBAR SPINE WO CONTRAST  Result Date: 02/23/2022 CLINICAL DATA:  Initial evaluation for staph bacteremia. EXAM: MRI LUMBAR SPINE WITHOUT CONTRAST TECHNIQUE: Multiplanar, multisequence MR imaging of the lumbar spine was performed. No intravenous contrast was administered. COMPARISON:  Prior CT from 02/21/2022. FINDINGS: Segmentation: Examination is severely degraded by motion artifact, markedly limiting assessment. Standard segmentation. Alignment: Physiologic with preservation of the normal lumbar lordosis. No significant listhesis. Vertebrae: Vertebral body height grossly maintained with no acute or chronic fracture. Bone marrow signal intensity diffusely heterogeneous. No worrisome osseous lesions. Fluid signal intensity with adjacent mild reactive endplate changes noted at the T12-L1, L1-2, L2-3, L3-4, and L4-5 interspaces. While these findings are in large part suspected to be degenerative, possible infection with osteomyelitis discitis is difficult to exclude on this severely motion degraded exam. Question of mild paraspinous edema within the adjacent psoas musculature. Conus medullaris and cauda equina: Conus extends to the L1 level. Conus and cauda equina are grossly within normal limits. No visible epidural collections, although evaluation is fairly limited. Paraspinal and other soft tissues: Question mild paraspinous edema within the psoas musculature bilaterally. No visible collections. Disc levels: T12-L1: Disc bulge with endplate spurring. Left greater than right facet hypertrophy. No significant spinal stenosis. Foramina appear grossly patent. L1-2:  Intervertebral disc space narrowing with diffuse disc bulge and reactive endplate change. Moderate facet hypertrophy. At least moderate spinal stenosis. Foramina appear grossly patent. L2-3: Degenerative intervertebral disc space narrowing with diffuse disc bulge and reactive endplate change. Moderate facet hypertrophy. Resultant severe spinal stenosis. Moderate right L2 foraminal narrowing. Left neural foramen remains patent. L3-4: Degenerative intervertebral disc space narrowing with diffuse disc bulge and endplate spurring. Moderate facet arthrosis. Resultant severe spinal stenosis. Foramina appear grossly patent. L4-5: Degenerative intervertebral disc space narrowing with diffuse disc bulge and reactive endplate spurring. Moderate facet hypertrophy. Moderate canal with severe left and moderate right lateral recess stenosis. Mild to moderate left L4 foraminal narrowing. Right neural foramen remains patent. L5-S1: Advanced degenerative intervertebral disc space narrowing with diffuse disc bulge and reactive endplate spurring. Moderate facet hypertrophy. Moderate left greater than right lateral recess stenosis. Central canal remains patent. No significant foraminal stenosis. IMPRESSION: 1. Severely motion degraded exam, markedly limiting assessment. 2. Fluid signal intensity with adjacent mild reactive endplate changes at the T12-L1, L1-2, L2-3, L3-4, and L4-5 interspaces. While these findings could be degenerative in nature, possible infection with osteomyelitis discitis is difficult to exclude on this severely motion degraded exam. Correlation with symptomatology and laboratory values recommended. 3. 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 Electronically Signed   By: Jeannine Boga M.D.   On: 02/23/2022 01:29   MR THORACIC SPINE WO CONTRAST  Result Date: 02/23/2022 CLINICAL DATA:  Initial  evaluation for staph bacteremia. EXAM: MRI THORACIC SPINE WITHOUT CONTRAST TECHNIQUE: Multiplanar, multisequence MR imaging of the thoracic spine was performed. No intravenous contrast was administered. COMPARISON:  Prior CT from 02/21/2022. FINDINGS: Alignment: Examination is severely degraded by motion artifact, markedly limiting assessment. Mild exaggeration of the normal thoracic kyphosis.  No listhesis. Vertebrae: Vertebral body height grossly maintained without acute or chronic fracture. Bone marrow signal intensity diffusely heterogeneous. 1 cm T1/T2 hyperintense lesion within the T1 vertebral body, likely a hemangioma. No definite worrisome osseous lesions. Fluid signal intensity with mild disc space widening and reactive endplate edema noted about the partially visualized C6-7 interspace (series 6, image 10). Suggestion of mild paraspinous edema within this region. While these findings could be degenerative, possible osteomyelitis discitis could also have this appearance. Otherwise, no other convincing evidence for acute infection elsewhere within the thoracic spine on this motion degraded exam. Cord: Grossly normal signal and morphology. No visible epidural collections. Paraspinal and other soft tissues: Probable mild paraspinous edema adjacent to the C6-7 interspace. No other visible paraspinous soft tissue abnormality. Layering bilateral pleural effusions with associated atelectasis and/or consolidation. Disc levels: Multilevel disc desiccation with mild noncompressive disc bulging and reactive endplate spurring seen throughout the thoracic spine. No significant or high-grade spinal stenosis. Foramina appear grossly patent. IMPRESSION: 1. Severely motion degraded exam. 2. Fluid signal intensity with reactive endplate in paraspinous edema about the partially visualized C6-7 interspace. While these findings could be degenerative in nature, possible osteomyelitis discitis could also have this appearance.  Correlation with symptomatology and laboratory values recommended. Further evaluation with dedicated MRI of the cervical spine could be performed for further evaluation as the patient is able to tolerate. 3. No other convincing evidence for acute infection within the thoracic spine. 4. Layering bilateral pleural effusions with associated atelectasis and/or consolidation. Electronically Signed   By: Jeannine Boga M.D.   On: 02/23/2022 01:20   ECHOCARDIOGRAM COMPLETE  Result Date: 02/22/2022    ECHOCARDIOGRAM REPORT   Patient Name:   TORE CARREKER Date of Exam: 02/22/2022 Medical Rec #:  [id number]        Height:       72.0 in Accession #:    [id number]       Weight:       171.0 lb Date of Birth:  1940-08-04        BSA:          1.993 m Patient Age:    17 years         BP:           110/43 mmHg Patient Gender: M                HR:           124 bpm. Exam Location:  Inpatient Procedure: 2D Echo, Cardiac Doppler and Color Doppler Indications:    elevated trops  History:        Patient has prior history of Echocardiogram examinations, most                 recent 01/17/2021. CAD, Arrythmias:Bradycardia; Risk                 Factors:Hypertension and Dyslipidemia.  Sonographer:    Melissa Morford RDCS (AE, PE) Referring Phys: [place]  1. Left ventricular ejection fraction, by estimation, is 60 to 65%. The left ventricle has normal function. The left ventricle has no regional wall motion abnormalities. Left ventricular diastolic parameters  are consistent with Grade I diastolic dysfunction (impaired relaxation). Elevated left ventricular end-diastolic pressure. The E/e' is 75.  2. Right ventricular systolic function is normal. The right ventricular size is normal. There is normal pulmonary artery systolic pressure. The estimated right ventricular systolic pressure is 76.5 mmHg.  3. Left atrial size was moderately dilated.  4. The mitral valve is abnormal. Mild mitral valve  regurgitation. Moderate mitral annular calcification.  5. The aortic valve is tricuspid. Aortic valve regurgitation is not visualized. Mild aortic valve stenosis. Aortic valve area, by VTI measures 2.45 cm. Aortic valve mean gradient measures 12.5 mmHg. Aortic valve Vmax measures 2.18 m/s.  6. Aortic dilatation noted. There is borderline dilatation of the ascending aorta, measuring 39 mm.  7. The inferior vena cava is dilated in size with <50% respiratory variability, suggesting right atrial pressure of 15 mmHg.  8. Cannot exclude a small PFO. Comparison(s): Changes from prior study are noted. 01/17/2021: LVEF 60-65%, mild AS - mean gradient 12 mmHg. FINDINGS  Left Ventricle: Left ventricular ejection fraction, by estimation, is 60 to 65%. The left ventricle has normal function. The left ventricle has no regional wall motion abnormalities. The left ventricular internal cavity size was normal in size. There is  no left ventricular hypertrophy. Left ventricular diastolic parameters are consistent with Grade I diastolic dysfunction (impaired relaxation). Elevated left ventricular end-diastolic pressure. The E/e' is 15. Right Ventricle: The right ventricular size is normal. No increase in right ventricular wall thickness. Right ventricular systolic function is normal. There is normal pulmonary artery systolic pressure. The tricuspid regurgitant velocity is 1.63 m/s, and  with an assumed right atrial pressure of 15 mmHg, the estimated right ventricular systolic pressure is 46.5 mmHg. Left Atrium: Left atrial size was moderately dilated. Right Atrium: Right atrial size was normal in size. Pericardium: There is no evidence of pericardial effusion. Mitral Valve: The mitral valve is abnormal. There is moderate calcification of the posterior mitral valve leaflet(s). Moderate mitral annular calcification. Mild mitral valve regurgitation. Tricuspid Valve: The tricuspid valve is grossly normal. Tricuspid valve regurgitation is  trivial. Aortic Valve: The aortic valve is tricuspid. Aortic valve regurgitation is not visualized. Mild aortic stenosis is present. Aortic valve mean gradient measures 12.5 mmHg. Aortic valve peak gradient measures 19.0 mmHg. Aortic valve area, by VTI measures 2.45 cm. Pulmonic Valve: The pulmonic valve was grossly normal. Pulmonic valve regurgitation is trivial. Aorta: Aortic dilatation noted. There is borderline dilatation of the ascending aorta, measuring 39 mm. Venous: The inferior vena cava is dilated in size with less than 50% respiratory variability, suggesting right atrial pressure of 15 mmHg. IAS/Shunts: Cannot exclude a small PFO.  LEFT VENTRICLE PLAX 2D LVIDd:         5.33 cm      Diastology LVIDs:         4.13 cm      LV e' medial:    8.92 cm/s LV PW:         0.93 cm      LV E/e' medial:  13.9 LV IVS:        0.87 cm      LV e' lateral:   6.64 cm/s LVOT diam:     2.10 cm      LV E/e' lateral: 18.7 LV SV:         108 LV SV Index:   54 LVOT Area:     3.46 cm  LV Volumes (MOD) LV vol d, MOD A2C: 143.0 ml LV vol  d, MOD A4C: 162.0 ml LV vol s, MOD A2C: 70.6 ml LV vol s, MOD A4C: 65.8 ml LV SV MOD A2C:     72.4 ml LV SV MOD A4C:     162.0 ml LV SV MOD BP:      88.1 ml RIGHT VENTRICLE RV S prime:     11.30 cm/s TAPSE (M-mode): 2.6 cm LEFT ATRIUM             Index        RIGHT ATRIUM           Index LA diam:        4.20 cm 2.11 cm/m   RA Area:     14.50 cm LA Vol (A2C):   77.0 ml 38.63 ml/m  RA Volume:   34.20 ml  17.16 ml/m LA Vol (A4C):   87.3 ml 43.80 ml/m LA Biplane Vol: 87.7 ml 44.00 ml/m  AORTIC VALVE AV Area (Vmax):    2.38 cm AV Area (Vmean):   2.31 cm AV Area (VTI):     2.45 cm AV Vmax:           218.00 cm/s AV Vmean:          167.000 cm/s AV VTI:            0.440 m AV Peak Grad:      19.0 mmHg AV Mean Grad:      12.5 mmHg LVOT Vmax:         150.00 cm/s LVOT Vmean:        111.500 cm/s LVOT VTI:          0.310 m LVOT/AV VTI ratio: 0.71  AORTA Ao Root diam: 3.60 cm Ao Asc diam:  3.90 cm MITRAL  VALVE                TRICUSPID VALVE MV Area (PHT): 4.80 cm     TR Peak grad:   10.6 mmHg MV Decel Time: 158 msec     TR Vmax:        163.00 cm/s MV E velocity: 124.00 cm/s MV A velocity: 121.00 cm/s  SHUNTS MV E/A ratio:  1.02         Systemic VTI:  0.31 m                             Systemic Diam: 2.10 cm Lyman Bishop MD Electronically signed by Lyman Bishop MD Signature Date/Time: 02/22/2022/11:01:26 AM    Final      Medical Consultants:   None.   Subjective:    VINCIENT VANAMAN relates his pain is controlled has not had a bowel movement.  Objective:    Vitals:   02/24/22 0410 02/24/22 0500 02/24/22 0530 02/24/22 0600  BP: (!) 126/57 125/67 (!) 113/58 130/65  Pulse: 72 69 72 71  Resp: (!) 22 (!) '21 19 18  '$ Temp: 97.9 F (36.6 C)     TempSrc: Oral     SpO2: 93% 95% 93% 92%  Weight:      Height:       SpO2: 92 %  No intake or output data in the 24 hours ending 02/24/22 0649  Filed Weights   02/21/22 1959  Weight: 77.6 kg    Exam: General exam: In no acute distress. Respiratory system: Good air movement and clear to auscultation. Cardiovascular system: S1 & S2 heard, RRR. No JVD. Gastrointestinal system: Abdomen is nondistended, soft and  nontender.  Extremities: No pedal edema. Skin: No rashes, lesions or ulcers Psychiatry: Judgement and insight appear normal. Mood & affect appropriate.   Data Reviewed:    Labs: Basic Metabolic Panel: Recent Labs  Lab 02/20/22 2308 02/21/22 2009 02/22/22 0230 02/22/22 0243 02/23/22 0243  NA 129* 137 135 135 138  K 3.5 3.2* 3.0* 2.9* 3.6  CL 94* 98 100  --  99  CO2 20* 25 25  --  25  GLUCOSE 123* 92 96  --  99  BUN 50* 20 24*  --  22  CREATININE 3.69* 2.05* 2.32*  --  2.17*  CALCIUM 8.2* 8.2* 7.9*  --  8.1*  MG 1.4* 1.7 1.7  --   --   PHOS  --   --  2.4*  --   --     GFR Estimated Creatinine Clearance: 29.8 mL/min (A) (by C-G formula based on SCr of 2.17 mg/dL (H)). Liver Function Tests: Recent Labs  Lab  02/20/22 2308 02/21/22 2009 02/22/22 0230  AST 28 40 37  ALT '15 17 19  '$ ALKPHOS 103 76 62  BILITOT 1.4* 1.6* 1.6*  PROT 5.5* 5.2* 4.8*  ALBUMIN 3.0* 2.6* 2.4*    No results for input(s): "LIPASE", "AMYLASE" in the last 168 hours. Recent Labs  Lab 02/22/22 0230  AMMONIA 13    Coagulation profile Recent Labs  Lab 02/21/22 2009 02/22/22 0230  INR 1.4* 1.4*    COVID-19 Labs  Recent Labs    02/22/22 0230  FERRITIN 1,703*     Lab Results  Component Value Date   SARSCOV2NAA NEGATIVE 02/22/2022   SARSCOV2NAA NEGATIVE 02/21/2022   SARSCOV2NAA NEGATIVE 12/20/2021   SARSCOV2NAA POSITIVE (A) 06/03/2021    CBC: Recent Labs  Lab 02/20/22 2308 02/21/22 2009 02/22/22 0243 02/23/22 0243  WBC 5.7 7.9  --  7.9  NEUTROABS 4.8 6.8  --   --   HGB 9.3* 8.4* 7.1* 7.6*  HCT 26.4* 23.9* 21.0* 22.6*  MCV 101.5* 100.8*  --  104.1*  PLT 93* 83*  --  73*    Cardiac Enzymes: Recent Labs  Lab 02/22/22 0230  CKTOTAL 256    BNP (last 3 results) No results for input(s): "PROBNP" in the last 8760 hours. CBG: Recent Labs  Lab 02/23/22 2345  GLUCAP 136*   D-Dimer: No results for input(s): "DDIMER" in the last 72 hours. Hgb A1c: No results for input(s): "HGBA1C" in the last 72 hours. Lipid Profile: No results for input(s): "CHOL", "HDL", "LDLCALC", "TRIG", "CHOLHDL", "LDLDIRECT" in the last 72 hours. Thyroid function studies: Recent Labs    02/22/22 0230  TSH 2.489    Anemia work up: Recent Labs    02/22/22 0230  VITAMINB12 826  FOLATE 20.8  FERRITIN 1,703*  TIBC 150*  IRON 15*    Sepsis Labs: Recent Labs  Lab 02/20/22 2308 02/21/22 2009 02/21/22 2300 02/22/22 0230 02/23/22 0243  PROCALCITON  --   --   --  8.80 10.32  WBC 5.7 7.9  --   --  7.9  LATICACIDVEN  --  2.8* 2.0* 1.4  --     Microbiology Recent Results (from the past 240 hour(s))  SARS Coronavirus 2 by RT PCR (hospital order, performed in [facility] hospital lab) *cepheid single  result test* Anterior Nasal Swab     Status: None   Collection Time: 02/21/22  3:16 AM   Specimen: Anterior Nasal Swab  Result Value Ref Range Status   SARS Coronavirus 2 by RT PCR  NEGATIVE NEGATIVE Final    Comment: (NOTE) SARS-CoV-2 target nucleic acids are NOT DETECTED.  The SARS-CoV-2 RNA is generally detectable in upper and lower respiratory specimens during the acute phase of infection. The lowest concentration of SARS-CoV-2 viral copies this assay can detect is 250 copies / mL. A negative result does not preclude SARS-CoV-2 infection and should not be used as the sole basis for treatment or other patient management decisions.  A negative result may occur with improper specimen collection / handling, submission of specimen other than nasopharyngeal swab, presence of viral mutation(s) within the areas targeted by this assay, and inadequate number of viral copies (<250 copies / mL). A negative result must be combined with clinical observations, patient history, and epidemiological information.  Fact Sheet for Patients:   https://www.patel.info/  Fact Sheet for Healthcare Providers: https://hall.com/  This test is not yet approved or  cleared by the [place] FDA and has been authorized for detection and/or diagnosis of SARS-CoV-2 by FDA under an Emergency Use Authorization (EUA).  This EUA will remain in effect (meaning this test can be used) for the duration of the COVID-19 declaration under Section 564(b)(1) of the Act, 21 U.S.C. section 360bbb-3(b)(1), unless the authorization is terminated or revoked sooner.  Performed at [facility], [place] [address], [place], [place] [address]   Blood culture (routine x 2)     Status: Abnormal   Collection Time: 02/21/22  4:07 AM   Specimen: BLOOD RIGHT HAND  Result Value Ref Range Status   Specimen Description BLOOD RIGHT HAND  Final   Special Requests AEROBIC BOTTLE ONLY Blood  Culture adequate volume  Final   Culture  Setup Time   Final    GRAM POSITIVE COCCI IN CLUSTERS AEROBIC BOTTLE ONLY CRITICAL RESULT CALLED TO, READ BACK BY AND VERIFIED WITH: Butler ON 02/21/22 @ 2022 BY DRT Performed at [facility], [place] [address] [place], [place] [address]    Culture STAPHYLOCOCCUS AUREUS (A)  Final   Report Status 02/23/2022 FINAL  Final   Organism ID, Bacteria STAPHYLOCOCCUS AUREUS  Final      Susceptibility   Staphylococcus aureus - MIC*    CIPROFLOXACIN <=0.5 SENSITIVE Sensitive     ERYTHROMYCIN >=8 RESISTANT Resistant     GENTAMICIN <=0.5 SENSITIVE Sensitive     OXACILLIN <=0.25 SENSITIVE Sensitive     TETRACYCLINE >=16 RESISTANT Resistant     VANCOMYCIN <=0.5 SENSITIVE Sensitive     TRIMETH/SULFA <=10 SENSITIVE Sensitive     CLINDAMYCIN RESISTANT Resistant     RIFAMPIN <=0.5 SENSITIVE Sensitive     Inducible Clindamycin POSITIVE Resistant     * STAPHYLOCOCCUS AUREUS  Blood Culture ID Panel (Reflexed)     Status: Abnormal   Collection Time: 02/21/22  4:07 AM  Result Value Ref Range Status   Enterococcus faecalis NOT DETECTED NOT DETECTED Final   Enterococcus Faecium NOT DETECTED NOT DETECTED Final   Listeria monocytogenes NOT DETECTED NOT DETECTED Final   Staphylococcus species DETECTED (A) NOT DETECTED Final    Comment: CRITICAL RESULT CALLED TO, READ BACK BY AND VERIFIED WITH: PHARM CATHY PEARCE ON 02/21/22 @ 2022 BY DRT    Staphylococcus aureus (BCID) DETECTED (A) NOT DETECTED Final    Comment: CRITICAL RESULT CALLED TO, READ BACK BY AND VERIFIED WITH: PHARM CATHY PEARCE ON 02/21/22 @ 2022 BY DRT    Staphylococcus epidermidis NOT DETECTED NOT DETECTED Final   Staphylococcus lugdunensis NOT DETECTED NOT DETECTED Final   Streptococcus  species NOT DETECTED NOT DETECTED Final   Streptococcus agalactiae NOT DETECTED NOT DETECTED Final   Streptococcus pneumoniae NOT DETECTED NOT DETECTED Final   Streptococcus pyogenes NOT DETECTED NOT  DETECTED Final   A.calcoaceticus-baumannii NOT DETECTED NOT DETECTED Final   Bacteroides fragilis NOT DETECTED NOT DETECTED Final   Enterobacterales NOT DETECTED NOT DETECTED Final   Enterobacter cloacae complex NOT DETECTED NOT DETECTED Final   Escherichia coli NOT DETECTED NOT DETECTED Final   Klebsiella aerogenes NOT DETECTED NOT DETECTED Final   Klebsiella oxytoca NOT DETECTED NOT DETECTED Final   Klebsiella pneumoniae NOT DETECTED NOT DETECTED Final   Proteus species NOT DETECTED NOT DETECTED Final   Salmonella species NOT DETECTED NOT DETECTED Final   Serratia marcescens NOT DETECTED NOT DETECTED Final   Haemophilus influenzae NOT DETECTED NOT DETECTED Final   Neisseria meningitidis NOT DETECTED NOT DETECTED Final   Pseudomonas aeruginosa NOT DETECTED NOT DETECTED Final   Stenotrophomonas maltophilia NOT DETECTED NOT DETECTED Final   Candida albicans NOT DETECTED NOT DETECTED Final   Candida auris NOT DETECTED NOT DETECTED Final   Candida glabrata NOT DETECTED NOT DETECTED Final   Candida krusei NOT DETECTED NOT DETECTED Final   Candida parapsilosis NOT DETECTED NOT DETECTED Final   Candida tropicalis NOT DETECTED NOT DETECTED Final   Cryptococcus neoformans/gattii NOT DETECTED NOT DETECTED Final   Meth resistant mecA/C and MREJ NOT DETECTED NOT DETECTED Final    Comment: Performed at [facility], [address], [place], [place] [address]  Blood culture (routine x 2)     Status: Abnormal   Collection Time: 02/21/22  6:19 AM   Specimen: BLOOD LEFT HAND  Result Value Ref Range Status   Specimen Description BLOOD LEFT HAND  Final   Special Requests AEROBIC BOTTLE ONLY Blood Culture adequate volume  Final   Culture  Setup Time   Final    GRAM POSITIVE COCCI IN CLUSTERS AEROBIC BOTTLE ONLY CRITICAL VALUE NOTED.  VALUE IS CONSISTENT WITH PREVIOUSLY REPORTED AND CALLED VALUE.    Culture (A)  Final    STAPHYLOCOCCUS AUREUS SUSCEPTIBILITIES PERFORMED ON PREVIOUS CULTURE  WITHIN THE LAST 5 DAYS. Performed at [facility], [place] [address] [place], [place] [address]    Report Status 02/23/2022 FINAL  Final  Blood Culture (routine x 2)     Status: Abnormal (Preliminary result)   Collection Time: 02/21/22  8:42 PM   Specimen: BLOOD RIGHT FOREARM  Result Value Ref Range Status   Specimen Description BLOOD RIGHT FOREARM  Final   Special Requests   Final    BOTTLES DRAWN AEROBIC AND ANAEROBIC Blood Culture adequate volume   Culture  Setup Time   Final    GRAM POSITIVE COCCI IN CLUSTERS IN BOTH AEROBIC AND ANAEROBIC BOTTLES CRITICAL VALUE NOTED.  VALUE IS CONSISTENT WITH PREVIOUSLY REPORTED AND CALLED VALUE.    Culture (A)  Final    STAPHYLOCOCCUS AUREUS SUSCEPTIBILITIES PERFORMED ON PREVIOUS CULTURE WITHIN THE LAST 5 DAYS. Performed at [facility], [place] [address], [place], [place] [address]    Report Status PENDING  Incomplete  Blood Culture (routine x 2)     Status: Abnormal (Preliminary result)   Collection Time: 02/21/22  8:49 PM   Specimen: BLOOD  Result Value Ref Range Status   Specimen Description BLOOD RIGHT ANTECUBITAL  Final   Special Requests   Final    BOTTLES DRAWN AEROBIC AND ANAEROBIC Blood Culture adequate volume   Culture  Setup Time  Final    GRAM POSITIVE COCCI IN CLUSTERS IN BOTH AEROBIC AND ANAEROBIC BOTTLES CRITICAL RESULT CALLED TO, READ BACK BY AND VERIFIED WITH: PHARMD E [place] [id number] AT 1234 BY CM    Culture (A)  Final    STAPHYLOCOCCUS AUREUS SUSCEPTIBILITIES PERFORMED ON PREVIOUS CULTURE WITHIN THE LAST 5 DAYS. Performed at [facility], [place] [address] [place], [place] [address]    Report Status PENDING  Incomplete  Urine Culture     Status: Abnormal   Collection Time: 02/22/22  3:28 AM   Specimen: Urine, Clean Catch  Result Value Ref Range Status   Specimen Description URINE, CLEAN CATCH  Final   Special Requests NONE  Final   Culture (A)  Final    <10,000 COLONIES/mL INSIGNIFICANT  GROWTH Performed at [facility], [place] [address] [place], [place] [address]    Report Status 02/23/2022 FINAL  Final  Resp Panel by RT-PCR (Flu A&B, Covid) Anterior Nasal Swab     Status: None   Collection Time: 02/22/22  3:28 AM   Specimen: Anterior Nasal Swab  Result Value Ref Range Status   SARS Coronavirus 2 by RT PCR NEGATIVE NEGATIVE Final    Comment: (NOTE) SARS-CoV-2 target nucleic acids are NOT DETECTED.  The SARS-CoV-2 RNA is generally detectable in upper respiratory specimens during the acute phase of infection. The lowest concentration of SARS-CoV-2 viral copies this assay can detect is 138 copies/mL. A negative result does not preclude SARS-Cov-2 infection and should not be used as the sole basis for treatment or other patient management decisions. A negative result may occur with  improper specimen collection/handling, submission of specimen other than nasopharyngeal swab, presence of viral mutation(s) within the areas targeted by this assay, and inadequate number of viral copies(<138 copies/mL). A negative result must be combined with clinical observations, patient history, and epidemiological information. The expected result is Negative.  Fact Sheet for Patients:  EntrepreneurPulse.com.au  Fact Sheet for Healthcare Providers:  IncredibleEmployment.be  This test is no t yet approved or cleared by the [place] FDA and  has been authorized for detection and/or diagnosis of SARS-CoV-2 by FDA under an Emergency Use Authorization (EUA). This EUA will remain  in effect (meaning this test can be used) for the duration of the COVID-19 declaration under Section 564(b)(1) of the Act, 21 U.S.C.section 360bbb-3(b)(1), unless the authorization is terminated  or revoked sooner.       Influenza A by PCR NEGATIVE NEGATIVE Final   Influenza B by PCR NEGATIVE NEGATIVE Final    Comment: (NOTE) The Xpert Xpress SARS-CoV-2/FLU/RSV  plus assay is intended as an aid in the diagnosis of influenza from Nasopharyngeal swab specimens and should not be used as a sole basis for treatment. Nasal washings and aspirates are unacceptable for Xpert Xpress SARS-CoV-2/FLU/RSV testing.  Fact Sheet for Patients: EntrepreneurPulse.com.au  Fact Sheet for Healthcare Providers: IncredibleEmployment.be  This test is not yet approved or cleared by the [place] FDA and has been authorized for detection and/or diagnosis of SARS-CoV-2 by FDA under an Emergency Use Authorization (EUA). This EUA will remain in effect (meaning this test can be used) for the duration of the COVID-19 declaration under Section 564(b)(1) of the Act, 21 U.S.C. section 360bbb-3(b)(1), unless the authorization is terminated or revoked.  Performed at [facility], [place] [address], [place], [place] [address]   Respiratory (~20 pathogens) panel by PCR     Status: None   Collection Time: 02/22/22  3:28 AM  Specimen: Urine, Clean Catch; Respiratory  Result Value Ref Range Status   Adenovirus NOT DETECTED NOT DETECTED Final   Coronavirus 229E NOT DETECTED NOT DETECTED Final    Comment: (NOTE) The Coronavirus on the Respiratory Panel, DOES NOT test for the novel  Coronavirus (2019 nCoV)    Coronavirus HKU1 NOT DETECTED NOT DETECTED Final   Coronavirus NL63 NOT DETECTED NOT DETECTED Final   Coronavirus OC43 NOT DETECTED NOT DETECTED Final   Metapneumovirus NOT DETECTED NOT DETECTED Final   Rhinovirus / Enterovirus NOT DETECTED NOT DETECTED Final   Influenza A NOT DETECTED NOT DETECTED Final   Influenza B NOT DETECTED NOT DETECTED Final   Parainfluenza Virus 1 NOT DETECTED NOT DETECTED Final   Parainfluenza Virus 2 NOT DETECTED NOT DETECTED Final   Parainfluenza Virus 3 NOT DETECTED NOT DETECTED Final   Parainfluenza Virus 4 NOT DETECTED NOT DETECTED Final   Respiratory Syncytial Virus NOT DETECTED NOT DETECTED  Final   Bordetella pertussis NOT DETECTED NOT DETECTED Final   Bordetella Parapertussis NOT DETECTED NOT DETECTED Final   Chlamydophila pneumoniae NOT DETECTED NOT DETECTED Final   Mycoplasma pneumoniae NOT DETECTED NOT DETECTED Final    Comment: Performed at [facility], [place] [address], [place], [place] [address]  MRSA Next Gen by PCR, Nasal     Status: None   Collection Time: 02/22/22  3:33 AM   Specimen: STOOL; Nasal Swab  Result Value Ref Range Status   MRSA by PCR Next Gen NOT DETECTED NOT DETECTED Final    Comment: (NOTE) The GeneXpert MRSA Assay (FDA approved for NASAL specimens only), is one component of a comprehensive MRSA colonization surveillance program. It is not intended to diagnose MRSA infection nor to guide or monitor treatment for MRSA infections. Test performance is not FDA approved in patients less than 39 years old. Performed at [facility], [place] [address] [place], [place] [address]   Culture, blood (Routine X 2) w Reflex to ID Panel     Status: None (Preliminary result)   Collection Time: 02/23/22  2:43 AM   Specimen: BLOOD  Result Value Ref Range Status   Specimen Description BLOOD SITE NOT SPECIFIED  Final   Special Requests   Final    BOTTLES DRAWN AEROBIC AND ANAEROBIC Blood Culture results may not be optimal due to an inadequate volume of blood received in culture bottles   Culture  Setup Time   Final    GRAM POSITIVE COCCI IN CLUSTERS AEROBIC BOTTLE ONLY CRITICAL RESULT CALLED TO, READ BACK BY AND VERIFIED WITH: PHARMD MICHAEL Haskell ON 02/23/22 @ 2027 BY DRT Performed at [facility], [place] [address] [place], [place] [address]    Culture GRAM POSITIVE COCCI IN CLUSTERS  Final   Report Status PENDING  Incomplete  Culture, blood (Routine X 2) w Reflex to ID Panel     Status: None (Preliminary result)   Collection Time: 02/23/22  2:43 AM   Specimen: BLOOD  Result Value Ref Range Status   Specimen Description BLOOD SITE NOT  SPECIFIED  Final   Special Requests   Final    BOTTLES DRAWN AEROBIC AND ANAEROBIC Blood Culture adequate volume   Culture   Final    NO GROWTH < 12 HOURS Performed at [facility], [place] [address], [place], [place] [address]    Report Status PENDING  Incomplete     Medications:    amiodarone  200 mg Oral q morning  Chlorhexidine Gluconate Cloth  6 each Topical Q0600   escitalopram  10 mg Oral q morning   lidocaine  3 patch Transdermal Q24H   sodium chloride flush  3 mL Intravenous Q12H   tamsulosin  0.4 mg Oral QHS   Continuous Infusions:  sodium chloride     anticoagulant sodium citrate      ceFAZolin (ANCEF) IV        LOS: 2 days   Charlynne Cousins  [facility]  02/24/2022, 6:49 AM

## 2022-02-24 NOTE — Procedures (Signed)
HD Note:  Some information was entered later than the data was gathered due to patient care needs. The stated time with the data is accurate.   Received patient in bed to unit.    Informed consent signed and in chart.  Patient was pleasantly confused and responded well with redirection.When arriving to the [place] His BP was soft and UF was able to be on only when the BP was there to support.  Mendel Ryder Penniger was made aware of the inability to pull fluid with no new order.  At approximately 1700, patient became angry and wanting to go home.  Reassurance was given.  Patient did not respond well to reassurance offered. Patient began yelling out in pain at 1730, repositioned and medication for pain given.  See MAR.  2376 patient is resting, with eyes closed.   Transported back to the room  Alert, without acute distress.  Hand-off given to patient's nurse.   Access used: Left fistuala Access issues: None  The patient was not able to tolerate any fluid removal with this treatment due to low BP.  He received 200 ml of fluids.    [facility]

## 2022-02-24 NOTE — Progress Notes (Signed)
Initial Nutrition Assessment  DOCUMENTATION CODES:   Not applicable  INTERVENTION:  - Add Ensure Enlive po BID, each supplement provides 350 kcal and 20 grams of protein.  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  GOAL:   Patient will meet greater than or equal to 90% of their needs  MONITOR:   PO intake, Supplement acceptance  REASON FOR ASSESSMENT:   Consult Assessment of nutrition requirement/status  ASSESSMENT:   81 y.o. male admits from SNF related to weakness. PMH includes: HTN, HLD, GERD, OA, afib, sleep apnea, CHF, cirrhosis, GI bleed, ESRD on HD.  Meds reviewed. Labs reviewed.   Pt was in HD at time of assessment. Pt was a bit difficult to understand. Per record, pt is Ox2. The pt reports that he has been eating fairly well since admission. No intakes noted in record. Pt reports that he does drink Ensure at home. RD will add supplements and continue to monitor PO intakes.   NUTRITION - FOCUSED PHYSICAL EXAM:  Will attempt at follow up; pt receiving HD.   Diet Order:   Diet Order             DIET DYS 3 Room service appropriate? No; Fluid consistency: Thin  Diet effective now                   EDUCATION NEEDS:   Not appropriate for education at this time  Skin:  Skin Assessment: Reviewed RN Assessment  Last BM:  Unknown  Height:   Ht Readings from Last 1 Encounters:  02/21/22 6' (1.829 m)    Weight:   Wt Readings from Last 1 Encounters:  02/21/22 77.6 kg    Ideal Body Weight:  80.9 kg  BMI:  Body mass index is 23.19 kg/m.  Estimated Nutritional Needs:   Kcal:  4627-0350 kcals  Protein:  95-115 gm  Fluid:  0938-1829 mL  Thalia Bloodgood, RD, LDN, CNSC

## 2022-02-24 NOTE — Progress Notes (Deleted)
[facility] Progress Note   Subjective:   Patient seen and examined in room.  Getting ready to eat breakfast.  Denies CP, SOB, abdominal pain and n/v/d.    Objective Vitals:   02/24/22 0530 02/24/22 0600 02/24/22 0752 02/24/22 1056  BP: (!) 113/58 130/65 126/67 127/60  Pulse: 72 71 74 70  Resp: '19 18 20 15  '$ Temp:   98 F (36.7 C) 98.2 F (36.8 C)  TempSrc:   Oral Oral  SpO2: 93% 92% 92% 93%  Weight:      Height:       Physical Exam General:chronically ill appearing male in NAD, slow to respond Heart:RRR, no mrg Lungs:CTAB anterolaterally  Abdomen:soft, NTND Extremities:no LE edema  Dialysis Access: LU AVF +b/t   Filed Weights   02/21/22 1959  Weight: 77.6 kg   No intake or output data in the 24 hours ending 02/24/22 1121  Additional Objective Labs: Basic Metabolic Panel: Recent Labs  Lab 02/21/22 2009 02/22/22 0230 02/22/22 0243 02/23/22 0243  NA 137 135 135 138  K 3.2* 3.0* 2.9* 3.6  CL 98 100  --  99  CO2 25 25  --  25  GLUCOSE 92 96  --  99  BUN 20 24*  --  22  CREATININE 2.05* 2.32*  --  2.17*  CALCIUM 8.2* 7.9*  --  8.1*  PHOS  --  2.4*  --   --    Liver Function Tests: Recent Labs  Lab 02/20/22 2308 02/21/22 2009 02/22/22 0230  AST 28 40 37  ALT '15 17 19  '$ ALKPHOS 103 76 62  BILITOT 1.4* 1.6* 1.6*  PROT 5.5* 5.2* 4.8*  ALBUMIN 3.0* 2.6* 2.4*   CBC: Recent Labs  Lab 02/20/22 2308 02/21/22 2009 02/22/22 0243 02/23/22 0243  WBC 5.7 7.9  --  7.9  NEUTROABS 4.8 6.8  --   --   HGB 9.3* 8.4* 7.1* 7.6*  HCT 26.4* 23.9* 21.0* 22.6*  MCV 101.5* 100.8*  --  104.1*  PLT 93* 83*  --  73*   Blood Culture    Component Value Date/Time   SDES BLOOD SITE NOT SPECIFIED 02/23/2022 0243   SDES BLOOD SITE NOT SPECIFIED 02/23/2022 0243   SPECREQUEST  02/23/2022 0243    BOTTLES DRAWN AEROBIC AND ANAEROBIC Blood Culture results may not be optimal due to an inadequate volume of blood received in culture bottles   SPECREQUEST   02/23/2022 0243    BOTTLES DRAWN AEROBIC AND ANAEROBIC Blood Culture adequate volume   CULT (A) 02/23/2022 0243    STAPHYLOCOCCUS AUREUS SUSCEPTIBILITIES PERFORMED ON PREVIOUS CULTURE WITHIN THE LAST 5 DAYS. Performed at [facility], [place] [address], [place], [place] [address]    CULT  02/23/2022 0243    NO GROWTH 1 DAY Performed at [facility] [address] [place], [place] [address]    REPTSTATUS 02/24/2022 FINAL 02/23/2022 0243   REPTSTATUS PENDING 02/23/2022 0243    Cardiac Enzymes: Recent Labs  Lab 02/22/22 0230  CKTOTAL 256   CBG: Recent Labs  Lab 02/23/22 2345  GLUCAP 136*   Iron Studies:  Recent Labs    02/22/22 0230  IRON 15*  TIBC 150*  FERRITIN 1,703*   Lab Results  Component Value Date   INR 1.4 (H) 02/22/2022   INR 1.4 (H) 02/21/2022   INR 1.1 11/23/2021   Studies/Results: MR CERVICAL SPINE WO CONTRAST  Result Date: 02/23/2022 CLINICAL DATA:  Rule out discitis osteomyelitis.  MSSA  bacteremia. EXAM: MRI CERVICAL SPINE WITHOUT CONTRAST TECHNIQUE: Multiplanar, multisequence MR imaging of the cervical spine was performed. No intravenous contrast was administered. COMPARISON:  MRI thoracic spine 02/22/2022 FINDINGS: Alignment: Image quality degraded by motion Mild retrolisthesis C5-6. Vertebrae: Hemangioma T1 vertebral body.  Negative for fracture Prominent fluid in the disc space at C6-7 with mild edema in the adjacent endplates. Mild prevertebral fluid also present. Cord: Cord evaluation limited given the degree of motion. No cord compression or cord lesion identified. Posterior Fossa, vertebral arteries, paraspinal tissues: Prevertebral edema is present. No paraspinous fluid collection. No mass or adenopathy Disc levels: C2-3: Mild disc and facet degeneration. Mild foraminal narrowing bilaterally. C3-4: Disc and facet degeneration. Mild central canal stenosis and mild foraminal narrowing bilaterally due to spurring C4-5: Disc and facet  degeneration. Mild central canal stenosis and mild foraminal narrowing bilaterally due to spurring C5-6: Disc and facet degeneration. Mild spinal stenosis and mild foraminal narrowing bilaterally due to spurring C6-7: Large amount of fluid in the disc space with edema in the adjacent endplates. Fluid appears contiguous with prevertebral fluid. Findings are suspicious for discitis and osteomyelitis. No epidural abscess. Right-sided disc and osteophyte complex flattening the cord. There is moderate right foraminal narrowing and mild left foraminal narrowing. Mild to moderate central canal stenosis. C7-T1: Negative for stenosis. IMPRESSION: 1. Image quality degraded by motion. 2. Large amount of fluid in the disc space at C6-7 with edema in the adjacent endplates and mild prevertebral fluid. Findings are suspicious for discitis and osteomyelitis. No epidural abscess. Right-sided disc and osteophyte complex causing cord deformity and moderate right foraminal narrowing. Mild to moderate spinal stenosis. 3. 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 Electronically Signed   By: Franchot Gallo M.D.   On: 02/23/2022 11:26   MR LUMBAR SPINE WO CONTRAST  Result Date: 02/23/2022 CLINICAL DATA:  Initial evaluation for staph bacteremia. EXAM: MRI LUMBAR SPINE WITHOUT CONTRAST TECHNIQUE: Multiplanar, multisequence MR imaging of the lumbar spine was performed. No intravenous contrast was administered. COMPARISON:  Prior CT from 02/21/2022. FINDINGS: Segmentation: Examination is severely degraded by motion artifact, markedly limiting assessment. Standard segmentation. Alignment: Physiologic with preservation of the normal lumbar lordosis. No significant listhesis. Vertebrae: Vertebral body height grossly maintained with no acute or chronic fracture. Bone marrow signal intensity diffusely heterogeneous. No worrisome osseous lesions. Fluid signal intensity with adjacent mild  reactive endplate changes noted at the T12-L1, L1-2, L2-3, L3-4, and L4-5 interspaces. While these findings are in large part suspected to be degenerative, possible infection with osteomyelitis discitis is difficult to exclude on this severely motion degraded exam. Question of mild paraspinous edema within the adjacent psoas musculature. Conus medullaris and cauda equina: Conus extends to the L1 level. Conus and cauda equina are grossly within normal limits. No visible epidural collections, although evaluation is fairly limited. Paraspinal and other soft tissues: Question mild paraspinous edema within the psoas musculature bilaterally. No visible collections. Disc levels: T12-L1: Disc bulge with endplate spurring. Left greater than right facet hypertrophy. No significant spinal stenosis. Foramina appear grossly patent. L1-2: Intervertebral disc space narrowing with diffuse disc bulge and reactive endplate change. Moderate facet hypertrophy. At least moderate spinal stenosis. Foramina appear grossly patent. L2-3: Degenerative intervertebral disc space narrowing with diffuse disc bulge and reactive endplate change. Moderate facet hypertrophy. Resultant severe spinal stenosis. Moderate right L2 foraminal narrowing. Left neural foramen remains patent. L3-4: Degenerative intervertebral disc space narrowing with diffuse disc bulge and endplate spurring. Moderate facet arthrosis.  Resultant severe spinal stenosis. Foramina appear grossly patent. L4-5: Degenerative intervertebral disc space narrowing with diffuse disc bulge and reactive endplate spurring. Moderate facet hypertrophy. Moderate canal with severe left and moderate right lateral recess stenosis. Mild to moderate left L4 foraminal narrowing. Right neural foramen remains patent. L5-S1: Advanced degenerative intervertebral disc space narrowing with diffuse disc bulge and reactive endplate spurring. Moderate facet hypertrophy. Moderate left greater than right lateral  recess stenosis. Central canal remains patent. No significant foraminal stenosis. IMPRESSION: 1. Severely motion degraded exam, markedly limiting assessment. 2. Fluid signal intensity with adjacent mild reactive endplate changes at the T12-L1, L1-2, L2-3, L3-4, and L4-5 interspaces. While these findings could be degenerative in nature, possible infection with osteomyelitis discitis is difficult to exclude on this severely motion degraded exam. Correlation with symptomatology and laboratory values recommended. 3. 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 Electronically Signed   By: Jeannine Boga M.D.   On: 02/23/2022 01:29   MR THORACIC SPINE WO CONTRAST  Result Date: 02/23/2022 CLINICAL DATA:  Initial evaluation for staph bacteremia. EXAM: MRI THORACIC SPINE WITHOUT CONTRAST TECHNIQUE: Multiplanar, multisequence MR imaging of the thoracic spine was performed. No intravenous contrast was administered. COMPARISON:  Prior CT from 02/21/2022. FINDINGS: Alignment: Examination is severely degraded by motion artifact, markedly limiting assessment. Mild exaggeration of the normal thoracic kyphosis.  No listhesis. Vertebrae: Vertebral body height grossly maintained without acute or chronic fracture. Bone marrow signal intensity diffusely heterogeneous. 1 cm T1/T2 hyperintense lesion within the T1 vertebral body, likely a hemangioma. No definite worrisome osseous lesions. Fluid signal intensity with mild disc space widening and reactive endplate edema noted about the partially visualized C6-7 interspace (series 6, image 10). Suggestion of mild paraspinous edema within this region. While these findings could be degenerative, possible osteomyelitis discitis could also have this appearance. Otherwise, no other convincing evidence for acute infection elsewhere within the thoracic spine on this motion degraded  exam. Cord: Grossly normal signal and morphology. No visible epidural collections. Paraspinal and other soft tissues: Probable mild paraspinous edema adjacent to the C6-7 interspace. No other visible paraspinous soft tissue abnormality. Layering bilateral pleural effusions with associated atelectasis and/or consolidation. Disc levels: Multilevel disc desiccation with mild noncompressive disc bulging and reactive endplate spurring seen throughout the thoracic spine. No significant or high-grade spinal stenosis. Foramina appear grossly patent. IMPRESSION: 1. Severely motion degraded exam. 2. Fluid signal intensity with reactive endplate in paraspinous edema about the partially visualized C6-7 interspace. While these findings could be degenerative in nature, possible osteomyelitis discitis could also have this appearance. Correlation with symptomatology and laboratory values recommended. Further evaluation with dedicated MRI of the cervical spine could be performed for further evaluation as the patient is able to tolerate. 3. No other convincing evidence for acute infection within the thoracic spine. 4. Layering bilateral pleural effusions with associated atelectasis and/or consolidation. Electronically Signed   By: Jeannine Boga M.D.   On: 02/23/2022 01:20    Medications:  sodium chloride     anticoagulant sodium citrate      ceFAZolin (ANCEF) IV      amiodarone  200 mg Oral q morning   carvedilol  3.125 mg Oral BID WC   Chlorhexidine Gluconate Cloth  6 each Topical Q0600   Chlorhexidine Gluconate Cloth  6 each Topical Q0600   [START ON 03/03/2022] darbepoetin (ARANESP) injection - DIALYSIS  200 mcg Intravenous Q Thu-HD   escitalopram  10 mg  Oral q morning   lidocaine  3 patch Transdermal Q24H   sodium chloride flush  3 mL Intravenous Q12H   tamsulosin  0.4 mg Oral QHS    Dialysis Orders: TTS - NW 4hrs, BFR 400/AF 1.5,  EDW 79.8 kg, 3.0 K/ 2.5 Ca   Access: LU AVF  No Heparin  Mircera 150  mcg q2wks - last 9/27   Assessment/Plan: Sepsis due to staph aureus bacteremia  - Management per primary team -CT with likely PNA.  Started on broad spectrum ABX. ID consulted and ABX narrowed to IV cefazolin.  -Blood cultures 02/23/2021 + staph aureus -MRI cervical spine with findings suspicious for discitis and osteomyelitis. No epidural abscess. Per Neurosurgery no indication for surgery.  -TEE to be attempted 02/25/2022 per cardiology to r/o endocarditis.    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 Sharol Given with orthopedics. No surgical intervention indicated at this time - Does not think foot is cause of sepsis   Elevated troponin  - Management per primary team. Cardiology consulted.  -Likely demand ischemia in setting of bacteremia and osteomyelitis per cardiology.  -No ischemic evaluation needed at this time.    4. Acute hyponatremia - resolved.    5. Hypokalemia - last K 3.6, use increased K bath. - Continue to monitor    6. Diarrhea - improved   7. ESRD  -On HD TTS -Plan on HD today per regular schedule  -AVF does not appear to be source of bacteremia                8. Hypertension/volume   - Allow permissive HTN per primary team - Coreg held on admission - Does not appear fluid overloaded.  Received 2L in ED.  Left ~3L over dry weight last HD. Plan for UF as tolerated. Possible weight gain, may need to adjust dry.    9. Anemia of CKD  - Hgb 7.6 - ESA due 10/19 - increase dose to 271mg qwk - tsat 10%, ferritin 1700, no IV iron.  - Transfuse if Hbg <7.0   10. Secondary Hyperparathyroidism  - Ca 7.9, CCa 9.2, Phos 2.4 - Not on VDRA or binders.    11. Nutrition - On dysphagia diet w/fluid restrictions   12. Paroxysmal afib - not on AC 13. Hyperlipidema - continue statins 14. GERD 15. 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 CAD   LJen Mow PA-C [facility] 02/24/2022,11:21 AM  LOS: 2 days

## 2022-02-24 NOTE — ED Notes (Signed)
Pt currently in C-collar due to fracture

## 2022-02-24 NOTE — Plan of Care (Signed)

## 2022-02-24 NOTE — Progress Notes (Signed)
[facility] Progress Note   Subjective:   Finally in a floor room-  due for HD later today.  Food is in front of him-  he Is pathetic appearing-  cannot negotiate meal or drinks " I had a hell of a night"  c/o pain all over    Objective Vitals:   02/24/22 0530 02/24/22 0600 02/24/22 0752 02/24/22 1056  BP: (!) 113/58 130/65 126/67 127/60  Pulse: 72 71 74 70  Resp: 19 18 20 15   Temp:   98 F (36.7 C) 98.2 F (36.8 C)  TempSrc:   Oral Oral  SpO2: 93% 92% 92% 93%  Weight:      Height:       Physical Exam General:Lethargic, chronically ill appearing male in NAD Heart:RRR. No murmurs, rubs or gallops Lungs: CTA bilaterally. No wheeze, rales or rhonchi. Breathing is unlabored.  Abdomen:Soft, mild tenderness to LLQ with palpation, no guarding or rebound tenderness Extremities: No edema, ischemic changes, or open wounds Dialysis Access: LUE AVF   Filed Weights   02/21/22 1959  Weight: 77.6 kg   No intake or output data in the 24 hours ending 02/24/22 1111   Additional Objective Labs: Basic Metabolic Panel: Recent Labs  Lab 02/21/22 2009 02/22/22 0230 02/22/22 0243 02/23/22 0243  NA 137 135 135 138  K 3.2* 3.0* 2.9* 3.6  CL 98 100  --  99  CO2 25 25  --  25  GLUCOSE 92 96  --  99  BUN 20 24*  --  22  CREATININE 2.05* 2.32*  --  2.17*  CALCIUM 8.2* 7.9*  --  8.1*  PHOS  --  2.4*  --   --    Liver Function Tests: Recent Labs  Lab 02/20/22 2308 02/21/22 2009 02/22/22 0230  AST 28 40 37  ALT 15 17 19   ALKPHOS 103 76 62  BILITOT 1.4* 1.6* 1.6*  PROT 5.5* 5.2* 4.8*  ALBUMIN 3.0* 2.6* 2.4*   CBC: Recent Labs  Lab 02/20/22 2308 02/21/22 2009 02/22/22 0243 02/23/22 0243  WBC 5.7 7.9  --  7.9  NEUTROABS 4.8 6.8  --   --   HGB 9.3* 8.4* 7.1* 7.6*  HCT 26.4* 23.9* 21.0* 22.6*  MCV 101.5* 100.8*  --  104.1*  PLT 93* 83*  --  73*   Blood Culture    Component Value Date/Time   SDES BLOOD SITE NOT SPECIFIED 02/23/2022 0243   SDES BLOOD SITE  NOT SPECIFIED 02/23/2022 0243   SPECREQUEST  02/23/2022 0243    BOTTLES DRAWN AEROBIC AND ANAEROBIC Blood Culture results may not be optimal due to an inadequate volume of blood received in culture bottles   SPECREQUEST  02/23/2022 0243    BOTTLES DRAWN AEROBIC AND ANAEROBIC Blood Culture adequate volume   CULT (A) 02/23/2022 0243    STAPHYLOCOCCUS AUREUS SUSCEPTIBILITIES PERFORMED ON PREVIOUS CULTURE WITHIN THE LAST 5 DAYS. Performed at [facility], [place] [address], [place], [place] [address]    CULT  02/23/2022 0243    NO GROWTH 1 DAY Performed at [facility] [address], [place], [place] [address]    REPTSTATUS 02/24/2022 FINAL 02/23/2022 0243   REPTSTATUS PENDING 02/23/2022 0243   Cardiac Enzymes: Recent Labs  Lab 02/22/22 0230  CKTOTAL 256   Iron Studies:  Recent Labs    02/22/22 0230  IRON 15*  TIBC 150*  FERRITIN 1,703*   Lab Results  Component Value Date   INR 1.4 (H)  02/22/2022   INR 1.4 (H) 02/21/2022   INR 1.1 11/23/2021   Studies/Results: MR CERVICAL SPINE WO CONTRAST  Result Date: 02/23/2022 CLINICAL DATA:  Rule out discitis osteomyelitis.  MSSA bacteremia. EXAM: MRI CERVICAL SPINE WITHOUT CONTRAST TECHNIQUE: Multiplanar, multisequence MR imaging of the cervical spine was performed. No intravenous contrast was administered. COMPARISON:  MRI thoracic spine 02/22/2022 FINDINGS: Alignment: Image quality degraded by motion Mild retrolisthesis C5-6. Vertebrae: Hemangioma T1 vertebral body.  Negative for fracture Prominent fluid in the disc space at C6-7 with mild edema in the adjacent endplates. Mild prevertebral fluid also present. Cord: Cord evaluation limited given the degree of motion. No cord compression or cord lesion identified. Posterior Fossa, vertebral arteries, paraspinal tissues: Prevertebral edema is present. No paraspinous fluid collection. No mass or adenopathy Disc levels: C2-3: Mild disc and facet degeneration. Mild foraminal  narrowing bilaterally. C3-4: Disc and facet degeneration. Mild central canal stenosis and mild foraminal narrowing bilaterally due to spurring C4-5: Disc and facet degeneration. Mild central canal stenosis and mild foraminal narrowing bilaterally due to spurring C5-6: Disc and facet degeneration. Mild spinal stenosis and mild foraminal narrowing bilaterally due to spurring C6-7: Large amount of fluid in the disc space with edema in the adjacent endplates. Fluid appears contiguous with prevertebral fluid. Findings are suspicious for discitis and osteomyelitis. No epidural abscess. Right-sided disc and osteophyte complex flattening the cord. There is moderate right foraminal narrowing and mild left foraminal narrowing. Mild to moderate central canal stenosis. C7-T1: Negative for stenosis. IMPRESSION: 1. Image quality degraded by motion. 2. Large amount of fluid in the disc space at C6-7 with edema in the adjacent endplates and mild prevertebral fluid. Findings are suspicious for discitis and osteomyelitis. No epidural abscess. Right-sided disc and osteophyte complex causing cord deformity and moderate right foraminal narrowing. Mild to moderate spinal stenosis. 3. 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 Electronically Signed   By: Franchot Gallo M.D.   On: 02/23/2022 11:26   MR LUMBAR SPINE WO CONTRAST  Result Date: 02/23/2022 CLINICAL DATA:  Initial evaluation for staph bacteremia. EXAM: MRI LUMBAR SPINE WITHOUT CONTRAST TECHNIQUE: Multiplanar, multisequence MR imaging of the lumbar spine was performed. No intravenous contrast was administered. COMPARISON:  Prior CT from 02/21/2022. FINDINGS: Segmentation: Examination is severely degraded by motion artifact, markedly limiting assessment. Standard segmentation. Alignment: Physiologic with preservation of the normal lumbar lordosis. No significant listhesis. Vertebrae: Vertebral body height grossly maintained  with no acute or chronic fracture. Bone marrow signal intensity diffusely heterogeneous. No worrisome osseous lesions. Fluid signal intensity with adjacent mild reactive endplate changes noted at the T12-L1, L1-2, L2-3, L3-4, and L4-5 interspaces. While these findings are in large part suspected to be degenerative, possible infection with osteomyelitis discitis is difficult to exclude on this severely motion degraded exam. Question of mild paraspinous edema within the adjacent psoas musculature. Conus medullaris and cauda equina: Conus extends to the L1 level. Conus and cauda equina are grossly within normal limits. No visible epidural collections, although evaluation is fairly limited. Paraspinal and other soft tissues: Question mild paraspinous edema within the psoas musculature bilaterally. No visible collections. Disc levels: T12-L1: Disc bulge with endplate spurring. Left greater than right facet hypertrophy. No significant spinal stenosis. Foramina appear grossly patent. L1-2: Intervertebral disc space narrowing with diffuse disc bulge and reactive endplate change. Moderate facet hypertrophy. At least moderate spinal stenosis. Foramina appear grossly patent. L2-3: Degenerative intervertebral disc space narrowing with diffuse disc bulge and reactive endplate change.  Moderate facet hypertrophy. Resultant severe spinal stenosis. Moderate right L2 foraminal narrowing. Left neural foramen remains patent. L3-4: Degenerative intervertebral disc space narrowing with diffuse disc bulge and endplate spurring. Moderate facet arthrosis. Resultant severe spinal stenosis. Foramina appear grossly patent. L4-5: Degenerative intervertebral disc space narrowing with diffuse disc bulge and reactive endplate spurring. Moderate facet hypertrophy. Moderate canal with severe left and moderate right lateral recess stenosis. Mild to moderate left L4 foraminal narrowing. Right neural foramen remains patent. L5-S1: Advanced degenerative  intervertebral disc space narrowing with diffuse disc bulge and reactive endplate spurring. Moderate facet hypertrophy. Moderate left greater than right lateral recess stenosis. Central canal remains patent. No significant foraminal stenosis. IMPRESSION: 1. Severely motion degraded exam, markedly limiting assessment. 2. Fluid signal intensity with adjacent mild reactive endplate changes at the T12-L1, L1-2, L2-3, L3-4, and L4-5 interspaces. While these findings could be degenerative in nature, possible infection with osteomyelitis discitis is difficult to exclude on this severely motion degraded exam. Correlation with symptomatology and laboratory values recommended. 3. 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 Electronically Signed   By: Jeannine Boga M.D.   On: 02/23/2022 01:29   MR THORACIC SPINE WO CONTRAST  Result Date: 02/23/2022 CLINICAL DATA:  Initial evaluation for staph bacteremia. EXAM: MRI THORACIC SPINE WITHOUT CONTRAST TECHNIQUE: Multiplanar, multisequence MR imaging of the thoracic spine was performed. No intravenous contrast was administered. COMPARISON:  Prior CT from 02/21/2022. FINDINGS: Alignment: Examination is severely degraded by motion artifact, markedly limiting assessment. Mild exaggeration of the normal thoracic kyphosis.  No listhesis. Vertebrae: Vertebral body height grossly maintained without acute or chronic fracture. Bone marrow signal intensity diffusely heterogeneous. 1 cm T1/T2 hyperintense lesion within the T1 vertebral body, likely a hemangioma. No definite worrisome osseous lesions. Fluid signal intensity with mild disc space widening and reactive endplate edema noted about the partially visualized C6-7 interspace (series 6, image 10). Suggestion of mild paraspinous edema within this region. While these findings could be degenerative, possible osteomyelitis  discitis could also have this appearance. Otherwise, no other convincing evidence for acute infection elsewhere within the thoracic spine on this motion degraded exam. Cord: Grossly normal signal and morphology. No visible epidural collections. Paraspinal and other soft tissues: Probable mild paraspinous edema adjacent to the C6-7 interspace. No other visible paraspinous soft tissue abnormality. Layering bilateral pleural effusions with associated atelectasis and/or consolidation. Disc levels: Multilevel disc desiccation with mild noncompressive disc bulging and reactive endplate spurring seen throughout the thoracic spine. No significant or high-grade spinal stenosis. Foramina appear grossly patent. IMPRESSION: 1. Severely motion degraded exam. 2. Fluid signal intensity with reactive endplate in paraspinous edema about the partially visualized C6-7 interspace. While these findings could be degenerative in nature, possible osteomyelitis discitis could also have this appearance. Correlation with symptomatology and laboratory values recommended. Further evaluation with dedicated MRI of the cervical spine could be performed for further evaluation as the patient is able to tolerate. 3. No other convincing evidence for acute infection within the thoracic spine. 4. Layering bilateral pleural effusions with associated atelectasis and/or consolidation. Electronically Signed   By: Jeannine Boga M.D.   On: 02/23/2022 01:20    Medications:  sodium chloride     anticoagulant sodium citrate      ceFAZolin (ANCEF) IV      amiodarone  200 mg Oral q morning   carvedilol  3.125 mg Oral BID WC   Chlorhexidine Gluconate Cloth  6 each Topical  [id number]   Chlorhexidine Gluconate Cloth  6 each Topical Q0600   escitalopram  10 mg Oral q morning   lidocaine  3 patch Transdermal Q24H   sodium chloride flush  3 mL Intravenous Q12H   tamsulosin  0.4 mg Oral QHS    Dialysis Orders: TTS - NW 4hrs, BFR 400/AF 1.5,  EDW 79.8  kg, 3.0 K/ 2.5 Ca   Access: LU AVF  No Heparin  Mircera 150 mcg q2wks - last 9/27  Assessment/Plan: Sepsis due to gram-positive bacteremia in setting of left foot cellulitis and left foot osteomyelitis - Management per primary team -SIRS criteria met with tachycardia, fever, RR >20 -Received ~2L lactated ringers in ED.  -CT with likely PNA.  Started on broad spectrum ABX. ID consulted and ABX narrowed to IV cefazolin.  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 No epidural abscess.  -TEE to be attempted 02/25/2022 per cardiology. 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 Sharol Given with orthopedics. No surgical intervention indicated at this time - Does not think foot is cause of sepsis  Elevated troponin   -No ischemic evaluation needed at this time.     5. Hypokalemia - K 3.6 today  - Continue to monitor    7. ESRD  -On HD TTS -Last HD 02/22/2022. Tolerated well.  -Plan on HD today per regular schedule  -AVF does not appear to be source of bacteremia               8. Hypertension/volume   - Allow permissive HTN per primary team - Coreg held on admission - Does not appear fluid overloaded.  Received 2L in ED.  Left ~3L over dry weight last HD. Plan for UF as tolerated. Possible weight gain, may need to adjust dry.    9. Anemia of CKD  - Hgb 7.6 - ESA due 10/19 - increase dose to 267mg qwk - tsat 10%, ferritin 1700, no IV iron.  - Transfuse if Hbg <7.0   10. Secondary Hyperparathyroidism  - Ca 7.9, CCa 9.2, Phos 2.4 -  Not on VDRA or binders.    11. Dispo-  consider another palliative intervention -  pt clearly failing to thrive   [facility] 02/24/2022,11:11 AM  LOS: 2 days

## 2022-02-24 NOTE — NC FL2 (Signed)
[place] LEVEL OF CARE SCREENING TOOL     IDENTIFICATION  Patient Name: Darryl Diaz Birthdate: 22-Aug-1940 Sex: male Admission Date (Current Location): 02/21/2022  [facility] and [place] Number:  Herbalist and Address:  [facility], [place] [address], [place], [place] [address]      Provider Number: [id number]  Attending Physician Name and Address:  Charlynne Cousins, MD  Relative Name and Phone Number:  Nashton Belson, son - [phone]    Current Level of Care: Hospital Recommended Level of Care: [place] Prior Approval Number:    Date Approved/Denied:   PASRR Number: [id number]  Discharge Plan: SNF    Current Diagnoses: Patient Active Problem List   Diagnosis Date Noted   MSSA bacteremia 02/24/2022   Elevated troponin 02/22/2022   Sepsis ([place]) 02/22/2022   Cellulitis 02/22/2022   Prolonged QT interval 02/22/2022   CAP (community acquired pneumonia) 02/22/2022   Staphylococcus aureus bacteremia 02/22/2022   C1 cervical fracture ([place]) 01/21/2022   Generalized weakness 11/24/2021   Severe sepsis ([place]) 11/24/2021   Lactic acidosis 11/24/2021   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   Acute hyponatremia 09/27/2021   Incidental finding of COVID-19 virus infection 06/07/2021   Dependence on renal dialysis ([place]) 05/25/2021   Non-pressure chronic ulcer of other part of left foot with unspecified severity ([place]) 05/25/2021   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[place]) 05/25/2021   Osteomyelitis of great toe of left foot ([place])    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[place]) 04/13/2021   Cellulitis of right foot 01/17/2021   Normocytic anemia 01/17/2021   UTI (urinary tract infection) 01/17/2021   Acute GI bleeding 01/06/2021   Pancytopenia ([place]) 12/02/2020   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   Orthostatic hypotension 11/23/2020   Acute renal insufficiency  10/23/2020   Alcohol abuse 10/23/2020   Hypokalemia 10/23/2020   Hypomagnesemia 10/23/2020   Low back pain at multiple sites 10/23/2020   Malnutrition of mild degree Altamease [place]: 75% to less than 90% of standard weight) ([place]) 10/23/2020   Open wound of scalp without complication 11/57/2620   Oral candidiasis 10/23/2020   Cellulitis of left foot    Cellulitis of toe of left foot 10/13/2020   Skin ulcer of left great toe ([place]) 10/13/2020   Chronic diastolic CHF (congestive heart failure) ([place]) 10/13/2020   Transient weakness of left lower extremity 09/07/2020   Abnormality of gait 04/27/2020   Salmonella food poisoning 35/59/7416   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[place]) 03/17/2020   Yeast infection 03/16/2020   Diarrhea 03/16/2020   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 (HCC)    Allergies    Anemia, chronic disease    History of hypertension    Debility    Syncope and collapse 02/22/2020   Weakness 02/06/2020   History of repair of hip joint 10/15/2019   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   Pain in right foot 06/27/2019   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   Bradycardia 04/20/2019   Gastrointestinal hemorrhage 02/25/2019   Fall at home, initial encounter 02/25/2019   Bilateral lower extremity edema 02/25/2019   Chronic hyponatremia 02/25/2019   Fall 02/25/2019   Benign prostatic hyperplasia with lower urinary tract symptoms 12/19/2018   Hardening of the aorta (main artery of the heart) ([place]) 08/11/2018   Benign neoplasm of colon 06/18/2018   History of adenomatous polyp of colon 08/10/2017   Impaired fasting glucose 06/10/2016   Encounter for  general adult medical examination without abnormal findings 06/03/2016   Hip fracture ([place]) 03/30/2016   Hypertension 03/30/2016   Hyperlipidemia 03/30/2016   Osteoarthritis 03/30/2016   Depressive disorder 03/30/2016   Rhabdomyolysis 03/30/2016   Leukocytosis 03/30/2016   Pressure ulcer 03/30/2016   GERD  (gastroesophageal reflux disease) 12/17/2015   Paroxysmal atrial fibrillation ([place]) 12/17/2015   Long term (current) use of anticoagulants 12/17/2015   Pure hypercholesterolemia 12/17/2015   Seasonal allergic rhinitis 12/17/2015   CAD (coronary artery disease) 08/21/2015    Orientation RESPIRATION BLADDER Height & Weight     Self  Normal External catheter, Incontinent Weight:  (unable to get patient on ER stretcher & unable to stand) Height:  6' (182.9 cm)  BEHAVIORAL SYMPTOMS/MOOD NEUROLOGICAL BOWEL NUTRITION STATUS      Continent Diet (See discharge summary)  AMBULATORY STATUS COMMUNICATION OF NEEDS Skin   Total Care Verbally Bruising (Bruising to Right ribs)                       Personal Care Assistance Level of Assistance  Bathing, Feeding, Dressing Bathing Assistance: Limited assistance Feeding assistance: Limited assistance Dressing Assistance: Limited assistance     Functional Limitations Info  Sight, Hearing, Speech Sight Info: Impaired Hearing Info: Adequate Speech Info: Adequate    SPECIAL CARE FACTORS FREQUENCY  PT (By licensed PT), OT (By licensed OT)     PT Frequency: 3-5 x per week OT Frequency: 3-5 x per week            Contractures Contractures Info: Not present    Additional Factors Info  Code Status, Allergies, Psychotropic Code Status Info: DNR Allergies Info: Penicillin G Sodium, Penicillin Psychotropic Info: Lexapro         Current Medications (02/24/2022):  This is the current hospital active medication list Current Facility-Administered Medications  Medication Dose Route Frequency Provider Last Rate Last Admin   0.9 %  sodium chloride infusion  250 mL Intravenous PRN Doutova, Anastassia, MD       acetaminophen (TYLENOL) tablet 650 mg  650 mg Oral Q6H PRN Toy Baker, MD   650 mg at 02/23/22 0348   Or   acetaminophen (TYLENOL) suppository 650 mg  650 mg Rectal Q6H PRN Doutova, Anastassia, MD       alteplase (CATHFLO  ACTIVASE) injection 2 mg  2 mg Intracatheter Once PRN Penninger, Ria Comment, PA       amiodarone (PACERONE) tablet 200 mg  200 mg Oral q morning [name], RPH   200 mg at 02/24/22 0350   anticoagulant sodium citrate solution 5 mL  5 mL Intracatheter PRN Penninger, Ria Comment, PA       carvedilol (COREG) tablet 3.125 mg  3.125 mg Oral BID WC Charlynne Cousins, MD   3.125 mg at 02/24/22 0754   ceFAZolin (ANCEF) IVPB 2g/100 mL premix  2 g Intravenous Q T,Th,Sa-HD Paytes, Austin A, RPH 200 mL/hr at 02/24/22 1218 2 g at 02/24/22 1218   Chlorhexidine Gluconate Cloth 2 % PADS 6 each  6 each Topical Q0600 Penninger, Ria Comment, PA       Chlorhexidine Gluconate Cloth 2 % PADS 6 each  6 each Topical Q0600 Penninger, [place], PA       Darbepoetin Alfa (ARANESP) injection 200 mcg  200 mcg Intravenous Q Thu-HD Penninger, [place], PA   200 mcg at 02/24/22 1441   escitalopram (LEXAPRO) tablet 10 mg  10 mg Oral q morning Doutova, Anastassia, MD   10 mg at 02/24/22  7034   heparin injection 1,000 Units  1,000 Units Intracatheter PRN Penninger, Ria Comment, [place]       HYDROcodone-acetaminophen (NORCO/VICODIN) 5-325 MG per tablet 1-2 tablet  1-2 tablet Oral Q4H PRN Toy Baker, MD   1 tablet at 02/23/22 2347   lidocaine (LIDODERM) 5 % 3 patch  3 patch Transdermal Q24H Skeet Latch, MD   3 patch at 02/23/22 1537   lidocaine (PF) (XYLOCAINE) 1 % injection 5 mL  5 mL Intradermal PRN Penninger, Ria Comment, PA       lidocaine-prilocaine (EMLA) cream 1 Application  1 Application Topical PRN Penninger, Ria Comment, PA       LORazepam (ATIVAN) injection 0.5 mg  0.5 mg Intravenous PRN Kristopher Oppenheim, DO   0.5 mg at 02/22/22 2315   pentafluoroprop-tetrafluoroeth (GEBAUERS) aerosol 1 Application  1 Application Topical PRN Penninger, Ria Comment, PA       sodium chloride flush (NS) 0.9 % injection 3 mL  3 mL Intravenous Q12H Doutova, Anastassia, MD   3 mL at 02/24/22 1000   sodium chloride flush (NS) 0.9 % injection 3 mL  3 mL  Intravenous PRN Doutova, Anastassia, MD       tamsulosin (FLOMAX) capsule 0.4 mg  0.4 mg Oral QHS Doutova, Anastassia, MD   0.4 mg at 02/23/22 2346     Discharge Medications: Please see discharge summary for a list of discharge medications.  Relevant Imaging Results:  Relevant Lab Results:   Additional Information SS# [id number],  Wears C-collar for recent hx of fall and C1 fracture, HD on Tue, Thurs, Saturday at [facility].  Curlene Labrum, RN

## 2022-02-24 NOTE — Progress Notes (Signed)
ID Brief Note   Fever has resolved   10/16 blood cx all sets 4/4 MSSA 10/18 blood cx 1/2 sets staph aureus  Poor TEE candidate per Cardiology  No neurosurgical intervention per Neurosurgery regarding abnormalities seen in MRI spine, No epidural abscess or large fluid collection for IR guided drainage.    Will repeat 2 sets of peripheral blood cx today for clearance Will need 6- 8 weeks of IV abtx pending clearance of blood cx Agree with palliative care evaluation Following   [name], MD Infectious Disease Physician [facility] [address] [place], [place] [address] Phone: [phone]  Fax: [phone]

## 2022-02-24 NOTE — TOC Initial Note (Signed)
Transition of Care [facility]) - Initial/Assessment Note    Patient Details  Name: Darryl Diaz MRN: [id number] Date of Birth: [birth year]  Transition of Care [facility]) CM/SW Contact:    Curlene Labrum, RN Phone Number: 02/24/2022, 2:19 PM  Clinical Narrative:                 CM unable to meet with the patient at the bedside at this time.  The patient was admitted for sepsis, bacteremia, fatigue, poor appetite, and dysuria.  The patient's currently lives at [facility] in [place] and was evaluated by therapy prior to his transfer to [facility] for care on the inpatient unit.  I called and spoke with the patient's son, Jeanne Terrance, on the phone to discuss transitions of care needs for the patient.  The patient's son is visiting from [place] at this time and is in agreement that the patient will need SNF placement for short term rehab before returning to [place].  DME at the ALF at this time - RW and rollator.  The son states that patient has a CPAP at the ALF but has not used it in years.  Patient also refused WC in the past at the facility.  PCP - Dr. Osborne Casco  The patient is an active patient at [facility] on [place]. On Tuesday. Thursday and Saturday.  Transportation is provided by the ALF transport services on Tuesday and Thursday and Saturday transports are provided by [employer].  History of [facility] for HD as well.  CM will place SNF workup and present bed offers to the patient once available.  The patient remains inpatient and is not medically stable at this time and has current IV antibiotics and pending tests.  Expected Discharge Plan: Skilled Nursing Facility Barriers to Discharge: Continued Medical Work up   Patient Goals and CMS Choice Patient states their goals for this hospitalization and ongoing recovery are:: Patient unable to state goals at this time CMS Medicare.gov Compare Post Acute Care list provided to:: Patient  Represenative (must comment) (Patient's son, Zyron Deeley)    Expected Discharge Plan and Services Expected Discharge Plan: [place]   Discharge Planning Services: CM Consult Post Acute Care Choice: [place] Living arrangements for the past 2 months: [place] ([place])                                      Prior Living Arrangements/Services Living arrangements for the past 2 months: [place] ([place]) Lives with:: Facility Resident Patient language and need for interpreter reviewed:: Yes Do you feel safe going back to the place where you live?: Yes      Need for Family Participation in Patient Care: Yes (Comment) Care giver support system in place?: Yes (comment) Current home services: DME (RW, rollator at the ALF -) Criminal Activity/Legal Involvement Pertinent to Current Situation/Hospitalization: No - Comment as needed  Activities of Daily Living      Permission Sought/Granted Permission sought to share information with : Case Manager, Family Supports, Chartered certified accountant granted to share information with : Yes, Verbal Permission Granted     Permission granted to share info w AGENCY: SNF facility for short term rehab, [facility] granted to share info w Relationship: son, Jahzir Strohmeier - [phone]  Emotional Assessment   Attitude/Demeanor/Rapport: Unable to Assess Affect (typically observed): Unable to Assess Orientation: : Oriented to Self Alcohol / Substance Use: Not Applicable Psych Involvement: No (comment)  Admission diagnosis:  Sepsis ([place]) [A41.9] Pneumonia of right upper lobe due to infectious organism [J18.9] MSSA bacteremia [R78.81, B95.61] Patient Active Problem List   Diagnosis Date Noted   MSSA bacteremia 02/24/2022   Elevated troponin 02/22/2022   Sepsis ([place]) 02/22/2022    Cellulitis 02/22/2022   Prolonged QT interval 02/22/2022   CAP (community acquired pneumonia) 02/22/2022   Staphylococcus aureus bacteremia 02/22/2022   C1 cervical fracture ([place]) 01/21/2022   Generalized weakness 11/24/2021   Severe sepsis ([place]) 11/24/2021   Lactic acidosis 11/24/2021   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   Acute hyponatremia 09/27/2021   Incidental finding of COVID-19 virus infection 06/07/2021   Dependence on renal dialysis ([place]) 05/25/2021   Non-pressure chronic ulcer of other part of left foot with unspecified severity ([place]) 05/25/2021   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[place]) 05/25/2021   Osteomyelitis of great toe of left foot ([place])    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[place]) 04/13/2021   Cellulitis of right foot 01/17/2021   Normocytic anemia 01/17/2021   UTI (urinary tract infection) 01/17/2021   Acute GI bleeding 01/06/2021   Pancytopenia ([place]) 12/02/2020   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   Orthostatic hypotension 11/23/2020   Acute renal insufficiency 10/23/2020   Alcohol abuse 10/23/2020   Hypokalemia 10/23/2020   Hypomagnesemia 10/23/2020   Low back pain at multiple sites 10/23/2020   Malnutrition of mild degree Altamease Oiler: 75% to less than 90% of standard weight) ([place]) 10/23/2020   Open wound of scalp without complication 16/02/9603   Oral candidiasis 10/23/2020   Cellulitis of left foot    Cellulitis of toe of left foot 10/13/2020   Skin ulcer of left great toe ([place]) 10/13/2020   Chronic diastolic CHF (congestive heart failure) ([place]) 10/13/2020   Transient weakness of left lower extremity 09/07/2020   Abnormality of gait 04/27/2020   Salmonella food poisoning 54/01/8118   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   Yeast infection 03/16/2020   Diarrhea 03/16/2020   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 (HCC)    Allergies    Anemia, chronic disease    History of  hypertension    Debility    Syncope and collapse 02/22/2020   Weakness 02/06/2020   History of repair of hip joint 10/15/2019   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   Pain in right foot 06/27/2019   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   Bradycardia 04/20/2019   Gastrointestinal hemorrhage 02/25/2019   Fall at home, initial encounter 02/25/2019   Bilateral lower extremity edema 02/25/2019   Chronic hyponatremia 02/25/2019   Fall 02/25/2019   Benign prostatic hyperplasia with lower urinary tract symptoms 12/19/2018   Hardening of the aorta (main artery of the heart) ([place]) 08/11/2018   Benign neoplasm of colon 06/18/2018   History of adenomatous polyp of colon 08/10/2017   Impaired fasting glucose 06/10/2016   Encounter for general adult medical examination without abnormal findings 06/03/2016   Hip fracture ([place]) 03/30/2016   Hypertension 03/30/2016   Hyperlipidemia 03/30/2016   Osteoarthritis 03/30/2016   Depressive disorder 03/30/2016   Rhabdomyolysis 03/30/2016   Leukocytosis 03/30/2016   Pressure ulcer 03/30/2016   GERD (gastroesophageal reflux disease) 12/17/2015   Paroxysmal atrial fibrillation ([place]) 12/17/2015   Long term (current) use of anticoagulants 12/17/2015   Pure hypercholesterolemia 12/17/2015  Seasonal allergic rhinitis 12/17/2015   CAD (coronary artery disease) 08/21/2015   PCP:  [name], MD Pharmacy:   [facility], [place] [address] [place] [address] Phone: [phone] Fax: [phone]     Social Determinants of Health (SDOH) Interventions    Readmission Risk Interventions    02/24/2022    2:15 PM 09/29/2021    2:21 PM 01/18/2021    9:28 AM  Readmission Risk Prevention Plan  Transportation Screening Complete Complete Complete  Medication Review (RN Care Manager) Complete Complete Complete  PCP or Specialist appointment within 3-5 days of discharge Complete Complete Complete  HRI or Home  Care Consult Complete Complete Complete  SW Recovery Care/Counseling Consult Complete Complete Complete  Palliative Care Screening Not Applicable Not Applicable Not Applicable  Skilled Nursing Facility Complete Complete Complete

## 2022-02-24 NOTE — ED Notes (Signed)
Primofit replaced (leaking on gown/bed). Mepilex dressing placed to the sacral area and the left heel that is red, LLE elevated on pillow. Pt repositioned, given water and cranberry juice. Mittens repositioned. VSS at this time.

## 2022-02-24 NOTE — Progress Notes (Signed)
Pt receives out-pt HD at [facility] on TTS. Pt arrives at 11:40 for 12:00 chair time. Will assist as needed.   Melven Sartorius Renal Navigator [phone]

## 2022-02-25 ENCOUNTER — Encounter (HOSPITAL_COMMUNITY)
Admission: EM | Disposition: A | Discharge status: Hospice | Payer: Self-pay | Source: Home / Self Care | Attending: Internal Medicine

## 2022-02-25 DIAGNOSIS — R7881 Bacteremia: Secondary | ICD-10-CM | POA: Diagnosis not present

## 2022-02-25 DIAGNOSIS — K7030 Alcoholic cirrhosis of liver without ascites: Secondary | ICD-10-CM | POA: Diagnosis not present

## 2022-02-25 DIAGNOSIS — M4620 Osteomyelitis of vertebra, site unspecified: Secondary | ICD-10-CM

## 2022-02-25 DIAGNOSIS — B9561 Methicillin susceptible Staphylococcus aureus infection as the cause of diseases classified elsewhere: Secondary | ICD-10-CM | POA: Diagnosis not present

## 2022-02-25 DIAGNOSIS — L03116 Cellulitis of left lower limb: Secondary | ICD-10-CM | POA: Diagnosis not present

## 2022-02-25 DIAGNOSIS — R7989 Other specified abnormal findings of blood chemistry: Secondary | ICD-10-CM | POA: Diagnosis not present

## 2022-02-25 DIAGNOSIS — A419 Sepsis, unspecified organism: Secondary | ICD-10-CM | POA: Diagnosis not present

## 2022-02-25 LAB — RENAL FUNCTION PANEL
Albumin: 1.9 g/dL — ABNORMAL LOW (ref 3.5–5.0)
Anion gap: 11 (ref 5–15)
BUN: 22 mg/dL (ref 8–23)
CO2: 28 mmol/L (ref 22–32)
Calcium: 7.9 mg/dL — ABNORMAL LOW (ref 8.9–10.3)
Chloride: 97 mmol/L — ABNORMAL LOW (ref 98–111)
Creatinine, Ser: 2.03 mg/dL — ABNORMAL HIGH (ref 0.61–1.24)
GFR, Estimated: 33 mL/min — ABNORMAL LOW (ref >60)
Glucose, Bld: 99 mg/dL (ref 70–99)
Phosphorus: 1.4 mg/dL — ABNORMAL LOW (ref 2.5–4.6)
Potassium: 3.4 mmol/L — ABNORMAL LOW (ref 3.5–5.1)
Sodium: 136 mmol/L (ref 135–145)

## 2022-02-25 LAB — CBC
HCT: 22.1 % — ABNORMAL LOW (ref 39.0–52.0)
Hemoglobin: 7.5 g/dL — ABNORMAL LOW (ref 13.0–17.0)
MCH: 34.6 pg — ABNORMAL HIGH (ref 26.0–34.0)
MCHC: 33.9 g/dL (ref 30.0–36.0)
MCV: 101.8 fL — ABNORMAL HIGH (ref 80.0–100.0)
Platelets: 60 10*3/uL — ABNORMAL LOW (ref 150–400)
RBC: 2.17 MIL/uL — ABNORMAL LOW (ref 4.22–5.81)
RDW: 15.1 % (ref 11.5–15.5)
WBC: 5.5 10*3/uL (ref 4.0–10.5)
nRBC: 0 % (ref 0.0–0.2)

## 2022-02-25 LAB — GLUCOSE, CAPILLARY: Glucose-Capillary: 139 mg/dL — ABNORMAL HIGH (ref 70–99)

## 2022-02-25 SURGERY — ECHOCARDIOGRAM, TRANSESOPHAGEAL
Anesthesia: Monitor Anesthesia Care

## 2022-02-25 SURGERY — AMPUTATION BELOW KNEE
Anesthesia: Choice | Site: Knee | Laterality: Left

## 2022-02-25 MED ORDER — CEFAZOLIN SODIUM-DEXTROSE 2-4 GM/100ML-% IV SOLN
2.0000 g | INTRAVENOUS | Status: DC
  Administered 2022-02-26 – 2022-03-03 (×3): 2 g via INTRAVENOUS
  Filled 2022-02-25 (×3): qty 100

## 2022-02-25 MED ORDER — CHLORHEXIDINE GLUCONATE CLOTH 2 % EX PADS
6.0000 | MEDICATED_PAD | Freq: Every day | CUTANEOUS | Status: DC
  Administered 2022-02-26 – 2022-03-02 (×5): 6 via TOPICAL

## 2022-02-25 MED ORDER — SODIUM CHLORIDE 0.9 % IV SOLN: INTRAVENOUS | Status: DC

## 2022-02-25 MED ORDER — CEFAZOLIN SODIUM-DEXTROSE 1-4 GM/50ML-% IV SOLN
1.0000 g | Freq: Once | INTRAVENOUS | Status: AC
  Administered 2022-02-25: 1 g via INTRAVENOUS
  Filled 2022-02-25: qty 50

## 2022-02-25 MED ORDER — K PHOS MONO-SOD PHOS DI & MONO 155-852-130 MG PO TABS
250.0000 mg | ORAL_TABLET | Freq: Two times a day (BID) | ORAL | Status: AC
  Administered 2022-02-25 – 2022-02-26 (×4): 250 mg via ORAL
  Filled 2022-02-25 (×4): qty 1

## 2022-02-25 NOTE — Progress Notes (Signed)
[facility] Progress Note   Subjective:   Issues with dialysis noted-  could not complete-  became angry -  required sedation to stay on machine-  unable to remove fluid due to low BP   Objective Vitals:   02/24/22 1800 02/24/22 1842 02/25/22 0412 02/25/22 0816  BP: (!) 105/54 (!) 102/48 (!) 119/57 137/60  Pulse: 67 66 80 80  Resp:  14 18 20   Temp:   98.5 F (36.9 C) 98.3 F (36.8 C)  TempSrc:   Oral Oral  SpO2: 97% 92% (!) 87% (!) 84%  Weight:      Height:       Physical Exam General: chronically ill appearing male in neck collar-  he is sleeping soundly - did not wake as our conversations have not been fruitful  Heart:RRR. No murmurs, rubs or gallops Lungs: CTA bilaterally. No wheeze, rales or rhonchi. Breathing is unlabored.  Abdomen:Soft, mild tenderness to LLQ with palpation, no guarding or rebound tenderness Extremities: No edema, ischemic changes, or open wounds Dialysis Access: LUE AVF   Filed Weights   02/21/22 1959  Weight: 77.6 kg    Intake/Output Summary (Last 24 hours) at 02/25/2022 0839 Last data filed at 02/24/2022 1842 Gross per 24 hour  Intake 280 ml  Output -100 ml  Net 380 ml     Additional Objective Labs: Basic Metabolic Panel: Recent Labs  Lab 02/22/22 0230 02/22/22 0243 02/23/22 0243 02/25/22 0322  NA 135 135 138 136  K 3.0* 2.9* 3.6 3.4*  CL 100  --  99 97*  CO2 25  --  25 28  GLUCOSE 96  --  99 99  BUN 24*  --  22 22  CREATININE 2.32*  --  2.17* 2.03*  CALCIUM 7.9*  --  8.1* 7.9*  PHOS 2.4*  --   --  1.4*   Liver Function Tests: Recent Labs  Lab 02/20/22 2308 02/21/22 2009 02/22/22 0230 02/25/22 0322  AST 28 40 37  --   ALT 15 17 19   --   ALKPHOS 103 76 62  --   BILITOT 1.4* 1.6* 1.6*  --   PROT 5.5* 5.2* 4.8*  --   ALBUMIN 3.0* 2.6* 2.4* 1.9*   CBC: Recent Labs  Lab 02/20/22 2308 02/21/22 2009 02/22/22 0243 02/23/22 0243 02/25/22 0322  WBC 5.7 7.9  --  7.9 5.5  NEUTROABS 4.8 6.8  --   --   --    HGB 9.3* 8.4* 7.1* 7.6* 7.5*  HCT 26.4* 23.9* 21.0* 22.6* 22.1*  MCV 101.5* 100.8*  --  104.1* 101.8*  PLT 93* 83*  --  73* 60*   Blood Culture    Component Value Date/Time   SDES BLOOD SITE NOT SPECIFIED 02/23/2022 0243   SDES BLOOD SITE NOT SPECIFIED 02/23/2022 0243   SPECREQUEST  02/23/2022 0243    BOTTLES DRAWN AEROBIC AND ANAEROBIC Blood Culture results may not be optimal due to an inadequate volume of blood received in culture bottles   SPECREQUEST  02/23/2022 0243    BOTTLES DRAWN AEROBIC AND ANAEROBIC Blood Culture adequate volume   CULT (A) 02/23/2022 0243    STAPHYLOCOCCUS AUREUS SUSCEPTIBILITIES PERFORMED ON PREVIOUS CULTURE WITHIN THE LAST 5 DAYS. Performed at [facility], [place] [address], [place], [place] [address]    CULT  02/23/2022 0243    NO GROWTH 2 DAYS Performed at [facility], [place] [address], [place], [place] [address]    REPTSTATUS 02/24/2022 FINAL 02/23/2022  0243   REPTSTATUS PENDING 02/23/2022 0243   Cardiac Enzymes: Recent Labs  Lab 02/22/22 0230  CKTOTAL 256   Iron Studies:  No results for input(s): "IRON", "TIBC", "TRANSFERRIN", "FERRITIN" in the last 72 hours.  Lab Results  Component Value Date   INR 1.4 (H) 02/22/2022   INR 1.4 (H) 02/21/2022   INR 1.1 11/23/2021   Studies/Results: MR CERVICAL SPINE WO CONTRAST  Result Date: 02/23/2022 CLINICAL DATA:  Rule out discitis osteomyelitis.  MSSA bacteremia. EXAM: MRI CERVICAL SPINE WITHOUT CONTRAST TECHNIQUE: Multiplanar, multisequence MR imaging of the cervical spine was performed. No intravenous contrast was administered. COMPARISON:  MRI thoracic spine 02/22/2022 FINDINGS: Alignment: Image quality degraded by motion Mild retrolisthesis C5-6. Vertebrae: Hemangioma T1 vertebral body.  Negative for fracture Prominent fluid in the disc space at C6-7 with mild edema in the adjacent endplates. Mild prevertebral fluid also present. Cord: Cord evaluation limited given the degree of motion.  No cord compression or cord lesion identified. Posterior Fossa, vertebral arteries, paraspinal tissues: Prevertebral edema is present. No paraspinous fluid collection. No mass or adenopathy Disc levels: C2-3: Mild disc and facet degeneration. Mild foraminal narrowing bilaterally. C3-4: Disc and facet degeneration. Mild central canal stenosis and mild foraminal narrowing bilaterally due to spurring C4-5: Disc and facet degeneration. Mild central canal stenosis and mild foraminal narrowing bilaterally due to spurring C5-6: Disc and facet degeneration. Mild spinal stenosis and mild foraminal narrowing bilaterally due to spurring C6-7: Large amount of fluid in the disc space with edema in the adjacent endplates. Fluid appears contiguous with prevertebral fluid. Findings are suspicious for discitis and osteomyelitis. No epidural abscess. Right-sided disc and osteophyte complex flattening the cord. There is moderate right foraminal narrowing and mild left foraminal narrowing. Mild to moderate central canal stenosis. C7-T1: Negative for stenosis. IMPRESSION: 1. Image quality degraded by motion. 2. Large amount of fluid in the disc space at C6-7 with edema in the adjacent endplates and mild prevertebral fluid. Findings are suspicious for discitis and osteomyelitis. No epidural abscess. Right-sided disc and osteophyte complex causing cord deformity and moderate right foraminal narrowing. Mild to moderate spinal stenosis. 3. 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 Electronically Signed   By: Franchot Gallo M.D.   On: 02/23/2022 11:26    Medications:  sodium chloride      ceFAZolin (ANCEF) IV     [START ON 02/26/2022]  ceFAZolin (ANCEF) IV      amiodarone  200 mg Oral q morning   carvedilol  3.125 mg Oral BID WC   Chlorhexidine Gluconate Cloth  6 each Topical Q0600   Chlorhexidine Gluconate Cloth  6 each Topical Q0600   darbepoetin (ARANESP) injection - DIALYSIS   200 mcg Intravenous Q Thu-HD   escitalopram  10 mg Oral q morning   feeding supplement  237 mL Oral BID BM   lidocaine  3 patch Transdermal Q24H   sodium chloride flush  3 mL Intravenous Q12H   tamsulosin  0.4 mg Oral QHS    Dialysis Orders: TTS - NW 4hrs, BFR 400/AF 1.5,  EDW 79.8 kg, 3.0 K/ 2.5 Ca   Access: LU AVF  No Heparin  Mircera 150 mcg q2wks - last 9/27  Assessment/Plan: Sepsis due to gram-positive bacteremia in setting of left foot cellulitis and left foot osteomyelitis - Management per primary team -SIRS criteria met with tachycardia, fever, RR >20 -Received ~2L lactated ringers in ED.  -CT with likely PNA.  Started on broad spectrum  ABX. ID consulted and ABX narrowed to IV cefazolin.  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 No epidural abscess.  -TEE to be attempted today per cardiology. 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 Sharol Given with orthopedics. No surgical intervention indicated at this time - Does not think foot is cause of sepsis  Elevated troponin   -No ischemic evaluation needed at this time.     4. Hypokalemia High k bath with HD   7. ESRD  -On HD TTS-  yesterday there were issues due to pt dementia-  hope that this is due to acute illness -  if does not clear he cannot be dialyzed in the OP setting like this-  would not be safe  -AVF does not appear to be source of bacteremia Plan for next HD tomorrow               8. Hypertension/volume   - Allow permissive HTN per primary team - Coreg  held on admission - Does not appear fluid overloaded.  now under his EDW-  unable to remove anything with HD 10/19 due to low BP   9. Anemia of CKD  - Hgb 7.6 - ESA  increased dose to 263mg qwk - tsat 10%, ferritin 1700, no IV iron.  - Transfuse if Hbg <7.0   10. Secondary Hyperparathyroidism  - Ca 7.9, CCa 9.2, Phos 2.4 - Not on VDRA or binders.    11. Dispo-  consider another palliative intervention -  pt clearly failing to thrive   [facility] 02/25/2022,8:39 AM  LOS: 3 days

## 2022-02-25 NOTE — Progress Notes (Signed)
Speech Language Pathology Treatment: Dysphagia  Patient Details Name: Darryl Diaz MRN: [id number] DOB: 09/20/1940 Today's Date: 02/25/2022 Time: 5400-8676 SLP Time Calculation (min) (ACUTE ONLY): 12 min  Assessment / Plan / Recommendation Clinical Impression  Pt found with breakfast tray in room, noting that a peach cup was spilled in his lap upon SLP arrival without pt awareness. SLP provided assistance with additional feeding, also providing at least Mod cues from a cognitive standpoint. Pt had decreased awareness, particularly when changing between bolus type or delivery method. When handed the cup and allowed to self-feed via straw, pt drank very quickly with a strong cough response noted. When SLP assisted and helped slow his pacing, no further coughing was observed. Prolonged mastication and intermittent oral holding still noted with solids, making current diet likely appropriate. Pt would benefit from assistance during meals to maximize safety in light of cognitive status. Sign placed at [facility] and updated on whiteboard in room, with tray also removed from reach upon SLP departure. Will continue to follow.    HPI HPI: Pt is an 81 y/o M presented from SNF for decreased PO Intake, swelling and redness of the left foot.  Recent admit 9/15 after fall at home with C17f, continues to wear C-collar. PMH includes alcohol abuse, ESRD on HD MWF, CHF, HTN, and HLD.      SLP Plan  Continue with current plan of care      Recommendations for follow up therapy are one component of a multi-disciplinary discharge planning process, led by the attending physician.  Recommendations may be updated based on patient status, additional functional criteria and insurance authorization.    Recommendations  Diet recommendations: Dysphagia 3 (mechanical soft);Thin liquid Liquids provided via: Cup;Straw Medication Administration: Whole meds with liquid Supervision: Full supervision/cueing for compensatory  strategies;Staff to assist with self feeding Compensations: Slow rate;Small sips/bites;Minimize environmental distractions Postural Changes and/or Swallow Maneuvers: Seated upright 90 degrees                Oral Care Recommendations: Oral care BID;Staff/trained caregiver to provide oral care Follow Up Recommendations: Skilled nursing-short term rehab (<3 hours/day) Assistance recommended at discharge: Frequent or constant Supervision/Assistance SLP Visit Diagnosis: Dysphagia, unspecified (R13.10) Plan: Continue with current plan of care           LOsie Bond, M.A. CNorth EnglishOffice [phone] Secure chat preferred   02/25/2022, 12:29 PM

## 2022-02-25 NOTE — Progress Notes (Signed)
[facility] PROGRESS NOTE    Progress Note  KASHIF POOLER  DSK:[id number] DOB: March 10, 1941 DOA: 02/21/2022 PCP: [name], MD     Brief Narrative:   WELTON BORD is an 81 y.o. 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 who resides in a nursing home found to have decreased oral intake and weakness imaging showed possible pneumonia but had left foot cellulitis with evidence of osteomyelitis, blood cultures were positive for GPC in clusters was started empirically on IV vancomycin cefepime and Doxy ID was consulted and was transitioned to IV cefazolin    Assessment/Plan:   Sepsis due to gram-positive bacteremia in the setting discitis and osteomyelitis of C6-C7: Blood cultures in 02/20/2022, 02/21/2022, 02/22/2022 showed MSSA bacteremia Initially started on IV empiric antibiotics, ID was consulted now has been transitioned to IV cefazolin. Tmax of 98.6. MRI of the C-spine showed discitis and osteomyelitis with possible abscess C6-C7 TEE cannot be performed due to his C1 articular facet fracture. Blood culture was continue to be positive, so his blood cultures repeated on 02/24/2022. Will need 6 weeks of IV antibiotics after cultures have cleared.  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 Sharol Given has been notified for left foot osteomyelitis. Currently on empiric antibiotics. Ortho relates the foot does not appear to be the cause of sepsis.  Hypokalemia: Nephrology has been consulted, further management per hemodialysis.  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 Recheck a hemoglobin on 02/25/2022.  C1 articular facet fracture: Currently in a  c-collar. Cardiology will proceed to attempt TEE  Elevated troponins: In the setting of sepsis and end-stage renal disease, likely demand ischemia he denies any chest pain twelve-lead EKG showed no signs of ischemia.  Mild aortic stenosis: Mean gradient of 12 continue to monitor as an outpatient.  Mild hyponatremia: Further management per renal with dialysis.  Diarrhea: Has had no further diarrhea in house.  Paroxysmal atrial fibrillation: On anticoagulation rate control continue amiodarone.  Essential hypertension: Likely due to sepsis permissive hypertension was allowed his blood pressure has remained relatively stable. Blood pressure is trending up we will start Coreg.  Hyperlipidemia: Continue statins.  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  Prolonged QTc: Noted continue to monitor on telemetry.    DVT prophylaxis: lovenox Family Communication:none Status is: Inpatient Remains inpatient appropriate because: Likely back to SNF after infectious work-up and treatment has been completed.    Code Status:     Code Status Orders  (From admission, onward)           Start     Ordered   02/22/22 0243  Do not attempt resuscitation (DNR)  Continuous       Question Answer Comment  In the event of cardiac or respiratory ARREST Do not call a "code blue"   In the event of cardiac or respiratory ARREST Do not perform Intubation, CPR, defibrillation or ACLS   In the event of cardiac or respiratory ARREST Use medication by any route, position, wound care, and other measures to relive pain and suffering. May use oxygen, suction and manual treatment of airway obstruction as needed for comfort.      02/22/22 0242  Code Status History     Date Active Date Inactive Code Status Order ID Comments User Context   01/21/2022 0603 01/27/2022 1811 DNR [id number]  Shela Leff, MD ED   11/24/2021  0007 11/26/2021 2014 DNR [id number]  Rhetta Mura, DO ED   09/27/2021 2202 09/30/2021 2305 DNR [id number]  Rhetta Mura, DO ED   06/07/2021 2050 06/14/2021 2022 DNR [id number]  Rhetta Mura, DO ED   05/02/2021 0353 05/02/2021 2111 DNR [id number]  Elwyn Reach, MD Inpatient   01/17/2021 0623 01/22/2021 2354 DNR [id number]  Reubin Milan, MD ED   01/06/2021 2258 01/09/2021 2208 DNR [id number]  Lenore Cordia, MD ED   12/02/2020 1537 12/04/2020 1641 Full Code [id number]  Karmen Bongo, MD ED   10/13/2020 2222 10/17/2020 2016 Full Code [id number]  Chotiner, Yevonne Aline, MD Inpatient   09/07/2020 2215 09/13/2020 2142 Full Code [id number]  Lenore Cordia, MD ED   03/16/2020 0811 03/20/2020 1908 Full Code [id number]  Karmen Bongo, MD ED   03/06/2020 0100 03/11/2020 1501 Full Code [id number]  Cathlyn Parsons, PA-C Inpatient   03/06/2020 0100 03/06/2020 0100 Full Code [id number]  Cathlyn Parsons, PA-C Inpatient   02/22/2020 1704 03/06/2020 0029 Full Code [id number]  Freada Bergeron, MD ED   02/05/2020 2210 02/08/2020 0218 Full Code [id number]  Barrett, Evelene Croon, PA-C ED   10/15/2019 1204 10/16/2019 1922 Full Code [id number]  Norman Herrlich Inpatient   04/20/2019 1844 04/23/2019 1723 Full Code [id number]  Donita Brooks, NP ED   02/25/2019 2021 03/05/2019 1432 Full Code [id number]  Shela Leff, MD ED   08/11/2018 1707 08/12/2018 1446 Full Code [id number]  Barb Merino, MD Inpatient   04/30/2016 0253 05/04/2016 1828 Full Code [id number]  Wylene Simmer, MD Inpatient   03/30/2016 1347 04/04/2016 1440 Full Code [id number]  Rondel Jumbo, PA-C ED   03/30/2016 1347 03/30/2016 1347 Full Code [id number]  Rondel Jumbo, PA-C ED         IV Access:   Peripheral IV   Procedures and diagnostic studies:   MR CERVICAL SPINE WO CONTRAST  Result Date: 02/23/2022 CLINICAL DATA:  Rule out discitis osteomyelitis.  MSSA bacteremia. EXAM: MRI CERVICAL SPINE WITHOUT CONTRAST TECHNIQUE: Multiplanar,  multisequence MR imaging of the cervical spine was performed. No intravenous contrast was administered. COMPARISON:  MRI thoracic spine 02/22/2022 FINDINGS: Alignment: Image quality degraded by motion Mild retrolisthesis C5-6. Vertebrae: Hemangioma T1 vertebral body.  Negative for fracture Prominent fluid in the disc space at C6-7 with mild edema in the adjacent endplates. Mild prevertebral fluid also present. Cord: Cord evaluation limited given the degree of motion. No cord compression or cord lesion identified. Posterior Fossa, vertebral arteries, paraspinal tissues: Prevertebral edema is present. No paraspinous fluid collection. No mass or adenopathy Disc levels: C2-3: Mild disc and facet degeneration. Mild foraminal narrowing bilaterally. C3-4: Disc and facet degeneration. Mild central canal stenosis and mild foraminal narrowing bilaterally due to spurring C4-5: Disc and facet degeneration. Mild central canal stenosis and mild foraminal narrowing bilaterally due to spurring C5-6: Disc and facet degeneration. Mild spinal stenosis and mild foraminal narrowing bilaterally due to spurring C6-7: Large amount of fluid in the disc space with edema in the adjacent endplates. Fluid appears contiguous with prevertebral fluid. Findings are suspicious for discitis and osteomyelitis. No epidural abscess. Right-sided disc and osteophyte complex flattening the cord. There is moderate right foraminal narrowing and mild left foraminal narrowing. Mild to moderate central canal stenosis. C7-T1: Negative for stenosis.  IMPRESSION: 1. Image quality degraded by motion. 2. Large amount of fluid in the disc space at C6-7 with edema in the adjacent endplates and mild prevertebral fluid. Findings are suspicious for discitis and osteomyelitis. No epidural abscess. Right-sided disc and osteophyte complex causing cord deformity and moderate right foraminal narrowing. Mild to moderate spinal stenosis. 3. 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 Electronically Signed   By: Franchot Gallo M.D.   On: 02/23/2022 11:26     Medical Consultants:   None.   Subjective:    [name] had a bowel movement otherwise feels okay.  Objective:    Vitals:   02/24/22 1800 02/24/22 1842 02/25/22 0412 02/25/22 0816  BP: (!) 105/54 (!) 102/48 (!) 119/57 137/60  Pulse: 67 66 80 80  Resp:  '14 18 20  '$ Temp:   98.5 F (36.9 C) 98.3 F (36.8 C)  TempSrc:   Oral Oral  SpO2: 97% 92% (!) 87% (!) 84%  Weight:      Height:       SpO2: (!) 84 %   Intake/Output Summary (Last 24 hours) at 02/25/2022 1033 Last data filed at 02/24/2022 1842 Gross per 24 hour  Intake 280 ml  Output -100 ml  Net 380 ml    Filed Weights   02/21/22 1959  Weight: 77.6 kg    Exam: General exam: In no acute distress. Respiratory system: Good air movement and clear to auscultation. Cardiovascular system: S1 & S2 heard, RRR. No JVD. Gastrointestinal system: Abdomen is nondistended, soft and nontender.  Extremities: No pedal edema. Skin: No rashes, lesions or ulcers Psychiatry: Judgement and insight appear normal. Mood & affect appropriate.   Data Reviewed:    Labs: Basic Metabolic Panel: Recent Labs  Lab 02/20/22 2308 02/21/22 2009 02/22/22 0230 02/22/22 0243 02/23/22 0243 02/25/22 0322  NA 129* 137 135 135 138 136  K 3.5 3.2* 3.0* 2.9* 3.6 3.4*  CL 94* 98 100  --  99 97*  CO2 20* 25 25  --  25 28  GLUCOSE 123* 92 96  --  99 99  BUN 50* 20 24*  --  22 22  CREATININE 3.69* 2.05* 2.32*  --  2.17* 2.03*  CALCIUM 8.2* 8.2* 7.9*  --  8.1* 7.9*  MG 1.4* 1.7 1.7  --   --   --   PHOS  --   --  2.4*  --   --  1.4*    GFR Estimated Creatinine Clearance: 31.9 mL/min (A) (by C-G formula based on SCr of 2.03 mg/dL (H)). Liver Function Tests: Recent Labs  Lab 02/20/22 2308 02/21/22 2009 02/22/22 0230 02/25/22 0322  AST 28 40 37  --   ALT '15 17 19  '$ --   ALKPHOS 103 76 62  --    BILITOT 1.4* 1.6* 1.6*  --   PROT 5.5* 5.2* 4.8*  --   ALBUMIN 3.0* 2.6* 2.4* 1.9*    No results for input(s): "LIPASE", "AMYLASE" in the last 168 hours. Recent Labs  Lab 02/22/22 0230  AMMONIA 13    Coagulation profile Recent Labs  Lab 02/21/22 2009 02/22/22 0230  INR 1.4* 1.4*    COVID-19 Labs  No results for input(s): "DDIMER", "FERRITIN", "LDH", "CRP" in the last 72 hours.   Lab Results  Component Value Date   SARSCOV2NAA NEGATIVE 02/22/2022   SARSCOV2NAA NEGATIVE 02/21/2022   SARSCOV2NAA NEGATIVE 12/20/2021   SARSCOV2NAA POSITIVE (A) 06/03/2021    CBC:  Recent Labs  Lab 02/20/22 2308 02/21/22 2009 02/22/22 0243 02/23/22 0243 02/25/22 0322  WBC 5.7 7.9  --  7.9 5.5  NEUTROABS 4.8 6.8  --   --   --   HGB 9.3* 8.4* 7.1* 7.6* 7.5*  HCT 26.4* 23.9* 21.0* 22.6* 22.1*  MCV 101.5* 100.8*  --  104.1* 101.8*  PLT 93* 83*  --  73* 60*    Cardiac Enzymes: Recent Labs  Lab 02/22/22 0230  CKTOTAL 256    BNP (last 3 results) No results for input(s): "PROBNP" in the last 8760 hours. CBG: Recent Labs  Lab 02/23/22 2345  GLUCAP 136*    D-Dimer: No results for input(s): "DDIMER" in the last 72 hours. Hgb A1c: No results for input(s): "HGBA1C" in the last 72 hours. Lipid Profile: No results for input(s): "CHOL", "HDL", "LDLCALC", "TRIG", "CHOLHDL", "LDLDIRECT" in the last 72 hours. Thyroid function studies: No results for input(s): "TSH", "T4TOTAL", "T3FREE", "THYROIDAB" in the last 72 hours.  Invalid input(s): "FREET3"  Anemia work up: No results for input(s): "VITAMINB12", "FOLATE", "FERRITIN", "TIBC", "IRON", "RETICCTPCT" in the last 72 hours.  Sepsis Labs: Recent Labs  Lab 02/20/22 2308 02/21/22 2009 02/21/22 2300 02/22/22 0230 02/23/22 0243 02/25/22 0322  PROCALCITON  --   --   --  8.80 10.32  --   WBC 5.7 7.9  --   --  7.9 5.5  LATICACIDVEN  --  2.8* 2.0* 1.4  --   --     Microbiology Recent Results (from the past 240 hour(s))   SARS Coronavirus 2 by RT PCR (hospital order, performed in [facility] hospital lab) *cepheid single result test* Anterior Nasal Swab     Status: None   Collection Time: 02/21/22  3:16 AM   Specimen: Anterior Nasal Swab  Result Value Ref Range Status   SARS Coronavirus 2 by RT PCR NEGATIVE NEGATIVE Final    Comment: (NOTE) SARS-CoV-2 target nucleic acids are NOT DETECTED.  The SARS-CoV-2 RNA is generally detectable in upper and lower respiratory specimens during the acute phase of infection. The lowest concentration of SARS-CoV-2 viral copies this assay can detect is 250 copies / mL. A negative result does not preclude SARS-CoV-2 infection and should not be used as the sole basis for treatment or other patient management decisions.  A negative result may occur with improper specimen collection / handling, submission of specimen other than nasopharyngeal swab, presence of viral mutation(s) within the areas targeted by this assay, and inadequate number of viral copies (<250 copies / mL). A negative result must be combined with clinical observations, patient history, and epidemiological information.  Fact Sheet for Patients:   https://www.patel.info/  Fact Sheet for Healthcare Providers: https://hall.com/  This test is not yet approved or  cleared by the [place] FDA and has been authorized for detection and/or diagnosis of SARS-CoV-2 by FDA under an Emergency Use Authorization (EUA).  This EUA will remain in effect (meaning this test can be used) for the duration of the COVID-19 declaration under Section 564(b)(1) of the Act, 21 U.S.C. section 360bbb-3(b)(1), unless the authorization is terminated or revoked sooner.  Performed at [facility], [place] [address] [place], [place] [address]   Blood culture (routine x 2)     Status: Abnormal   Collection Time: 02/21/22  4:07 AM   Specimen: BLOOD RIGHT HAND  Result Value Ref Range  Status   Specimen Description BLOOD RIGHT HAND  Final   Special Requests AEROBIC BOTTLE ONLY Blood Culture  adequate volume  Final   Culture  Setup Time   Final    GRAM POSITIVE COCCI IN CLUSTERS AEROBIC BOTTLE ONLY CRITICAL RESULT CALLED TO, READ BACK BY AND VERIFIED WITH: [place] ON 02/21/22 @ 2022 BY DRT Performed at [facility], [place] [address] [place], [place] [address]    Culture STAPHYLOCOCCUS AUREUS (A)  Final   Report Status 02/23/2022 FINAL  Final   Organism ID, Bacteria STAPHYLOCOCCUS AUREUS  Final      Susceptibility   Staphylococcus aureus - MIC*    CIPROFLOXACIN <=0.5 SENSITIVE Sensitive     ERYTHROMYCIN >=8 RESISTANT Resistant     GENTAMICIN <=0.5 SENSITIVE Sensitive     OXACILLIN <=0.25 SENSITIVE Sensitive     TETRACYCLINE >=16 RESISTANT Resistant     VANCOMYCIN <=0.5 SENSITIVE Sensitive     TRIMETH/SULFA <=10 SENSITIVE Sensitive     CLINDAMYCIN RESISTANT Resistant     RIFAMPIN <=0.5 SENSITIVE Sensitive     Inducible Clindamycin POSITIVE Resistant     * STAPHYLOCOCCUS AUREUS  Blood Culture ID Panel (Reflexed)     Status: Abnormal   Collection Time: 02/21/22  4:07 AM  Result Value Ref Range Status   Enterococcus faecalis NOT DETECTED NOT DETECTED Final   Enterococcus Faecium NOT DETECTED NOT DETECTED Final   Listeria monocytogenes NOT DETECTED NOT DETECTED Final   Staphylococcus species DETECTED (A) NOT DETECTED Final    Comment: CRITICAL RESULT CALLED TO, READ BACK BY AND VERIFIED WITH: PHARM CATHY [place] ON 02/21/22 @ 2022 BY DRT    Staphylococcus aureus (BCID) DETECTED (A) NOT DETECTED Final    Comment: CRITICAL RESULT CALLED TO, READ BACK BY AND VERIFIED WITH: PHARM CATHY [place] ON 02/21/22 @ 2022 BY DRT    Staphylococcus epidermidis NOT DETECTED NOT DETECTED Final   Staphylococcus lugdunensis NOT DETECTED NOT DETECTED Final   Streptococcus species NOT DETECTED NOT DETECTED Final   Streptococcus agalactiae NOT DETECTED NOT DETECTED Final    Streptococcus pneumoniae NOT DETECTED NOT DETECTED Final   Streptococcus pyogenes NOT DETECTED NOT DETECTED Final   A.calcoaceticus-baumannii NOT DETECTED NOT DETECTED Final   Bacteroides fragilis NOT DETECTED NOT DETECTED Final   Enterobacterales NOT DETECTED NOT DETECTED Final   Enterobacter cloacae complex NOT DETECTED NOT DETECTED Final   Escherichia coli NOT DETECTED NOT DETECTED Final   Klebsiella aerogenes NOT DETECTED NOT DETECTED Final   Klebsiella oxytoca NOT DETECTED NOT DETECTED Final   Klebsiella pneumoniae NOT DETECTED NOT DETECTED Final   Proteus species NOT DETECTED NOT DETECTED Final   Salmonella species NOT DETECTED NOT DETECTED Final   Serratia marcescens NOT DETECTED NOT DETECTED Final   Haemophilus influenzae NOT DETECTED NOT DETECTED Final   Neisseria meningitidis NOT DETECTED NOT DETECTED Final   Pseudomonas aeruginosa NOT DETECTED NOT DETECTED Final   Stenotrophomonas maltophilia NOT DETECTED NOT DETECTED Final   Candida albicans NOT DETECTED NOT DETECTED Final   Candida auris NOT DETECTED NOT DETECTED Final   Candida glabrata NOT DETECTED NOT DETECTED Final   Candida krusei NOT DETECTED NOT DETECTED Final   Candida parapsilosis NOT DETECTED NOT DETECTED Final   Candida tropicalis NOT DETECTED NOT DETECTED Final   Cryptococcus neoformans/gattii NOT DETECTED NOT DETECTED Final   Meth resistant mecA/C and MREJ NOT DETECTED NOT DETECTED Final    Comment: Performed at [facility], [address] [place], [place] [address]  Blood culture (routine x 2)     Status: Abnormal   Collection Time: 02/21/22  6:19 AM  Specimen: BLOOD LEFT HAND  Result Value Ref Range Status   Specimen Description BLOOD LEFT HAND  Final   Special Requests AEROBIC BOTTLE ONLY Blood Culture adequate volume  Final   Culture  Setup Time   Final    GRAM POSITIVE COCCI IN CLUSTERS AEROBIC BOTTLE ONLY CRITICAL VALUE NOTED.  VALUE IS CONSISTENT WITH PREVIOUSLY REPORTED AND CALLED  VALUE.    Culture (A)  Final    STAPHYLOCOCCUS AUREUS SUSCEPTIBILITIES PERFORMED ON PREVIOUS CULTURE WITHIN THE LAST 5 DAYS. Performed at [facility], [place] [address] [place], [place] [address]    Report Status 02/23/2022 FINAL  Final  Blood Culture (routine x 2)     Status: Abnormal   Collection Time: 02/21/22  8:42 PM   Specimen: BLOOD RIGHT FOREARM  Result Value Ref Range Status   Specimen Description BLOOD RIGHT FOREARM  Final   Special Requests   Final    BOTTLES DRAWN AEROBIC AND ANAEROBIC Blood Culture adequate volume   Culture  Setup Time   Final    GRAM POSITIVE COCCI IN CLUSTERS IN BOTH AEROBIC AND ANAEROBIC BOTTLES CRITICAL VALUE NOTED.  VALUE IS CONSISTENT WITH PREVIOUSLY REPORTED AND CALLED VALUE.    Culture (A)  Final    STAPHYLOCOCCUS AUREUS SUSCEPTIBILITIES PERFORMED ON PREVIOUS CULTURE WITHIN THE LAST 5 DAYS. Performed at [facility], [place] [address] [place], [place] [address]    Report Status 02/24/2022 FINAL  Final  Blood Culture (routine x 2)     Status: Abnormal   Collection Time: 02/21/22  8:49 PM   Specimen: BLOOD  Result Value Ref Range Status   Specimen Description BLOOD RIGHT ANTECUBITAL  Final   Special Requests   Final    BOTTLES DRAWN AEROBIC AND ANAEROBIC Blood Culture adequate volume   Culture  Setup Time   Final    GRAM POSITIVE COCCI IN CLUSTERS IN BOTH AEROBIC AND ANAEROBIC BOTTLES CRITICAL RESULT CALLED TO, READ BACK BY AND VERIFIED WITH: PHARMD E [place] [id number] AT 1234 BY CM    Culture (A)  Final    STAPHYLOCOCCUS AUREUS SUSCEPTIBILITIES PERFORMED ON PREVIOUS CULTURE WITHIN THE LAST 5 DAYS. Performed at [facility], [place] [address], [name], [place] [address]    Report Status 02/24/2022 FINAL  Final  Urine Culture     Status: Abnormal   Collection Time: 02/22/22  3:28 AM   Specimen: Urine, Clean Catch  Result Value Ref Range Status   Specimen Description URINE, CLEAN CATCH  Final   Special Requests NONE  Final    Culture (A)  Final    <10,000 COLONIES/mL INSIGNIFICANT GROWTH Performed at [facility], [place] [address] [place], [place] [address]    Report Status 02/23/2022 FINAL  Final  Resp Panel by RT-PCR (Flu A&B, Covid) Anterior Nasal Swab     Status: None   Collection Time: 02/22/22  3:28 AM   Specimen: Anterior Nasal Swab  Result Value Ref Range Status   SARS Coronavirus 2 by RT PCR NEGATIVE NEGATIVE Final    Comment: (NOTE) SARS-CoV-2 target nucleic acids are NOT DETECTED.  The SARS-CoV-2 RNA is generally detectable in upper respiratory specimens during the acute phase of infection. The lowest concentration of SARS-CoV-2 viral copies this assay can detect is 138 copies/mL. A negative result does not preclude SARS-Cov-2 infection and should not be used as the sole basis for treatment or other patient management decisions. A negative result may occur with  improper specimen collection/handling, submission  of specimen other than nasopharyngeal swab, presence of viral mutation(s) within the areas targeted by this assay, and inadequate number of viral copies(<138 copies/mL). A negative result must be combined with clinical observations, patient history, and epidemiological information. The expected result is Negative.  Fact Sheet for Patients:  EntrepreneurPulse.com.au  Fact Sheet for Healthcare Providers:  IncredibleEmployment.be  This test is no t yet approved or cleared by the [place] FDA and  has been authorized for detection and/or diagnosis of SARS-CoV-2 by FDA under an Emergency Use Authorization (EUA). This EUA will remain  in effect (meaning this test can be used) for the duration of the COVID-19 declaration under Section 564(b)(1) of the Act, 21 U.S.C.section 360bbb-3(b)(1), unless the authorization is terminated  or revoked sooner.       Influenza A by PCR NEGATIVE NEGATIVE Final   Influenza B by PCR NEGATIVE NEGATIVE  Final    Comment: (NOTE) The Xpert Xpress SARS-CoV-2/FLU/RSV plus assay is intended as an aid in the diagnosis of influenza from Nasopharyngeal swab specimens and should not be used as a sole basis for treatment. Nasal washings and aspirates are unacceptable for Xpert Xpress SARS-CoV-2/FLU/RSV testing.  Fact Sheet for Patients: EntrepreneurPulse.com.au  Fact Sheet for Healthcare Providers: IncredibleEmployment.be  This test is not yet approved or cleared by the [place] FDA and has been authorized for detection and/or diagnosis of SARS-CoV-2 by FDA under an Emergency Use Authorization (EUA). This EUA will remain in effect (meaning this test can be used) for the duration of the COVID-19 declaration under Section 564(b)(1) of the Act, 21 U.S.C. section 360bbb-3(b)(1), unless the authorization is terminated or revoked.  Performed at [facility], [place] [address], [place], [place] [address]   Respiratory (~20 pathogens) panel by PCR     Status: None   Collection Time: 02/22/22  3:28 AM   Specimen: Urine, Clean Catch; Respiratory  Result Value Ref Range Status   Adenovirus NOT DETECTED NOT DETECTED Final   Coronavirus 229E NOT DETECTED NOT DETECTED Final    Comment: (NOTE) The Coronavirus on the Respiratory Panel, DOES NOT test for the novel  Coronavirus (2019 nCoV)    Coronavirus HKU1 NOT DETECTED NOT DETECTED Final   Coronavirus NL63 NOT DETECTED NOT DETECTED Final   Coronavirus OC43 NOT DETECTED NOT DETECTED Final   Metapneumovirus NOT DETECTED NOT DETECTED Final   Rhinovirus / Enterovirus NOT DETECTED NOT DETECTED Final   Influenza A NOT DETECTED NOT DETECTED Final   Influenza B NOT DETECTED NOT DETECTED Final   Parainfluenza Virus 1 NOT DETECTED NOT DETECTED Final   Parainfluenza Virus 2 NOT DETECTED NOT DETECTED Final   Parainfluenza Virus 3 NOT DETECTED NOT DETECTED Final   Parainfluenza Virus 4 NOT DETECTED NOT DETECTED  Final   Respiratory Syncytial Virus NOT DETECTED NOT DETECTED Final   Bordetella pertussis NOT DETECTED NOT DETECTED Final   Bordetella Parapertussis NOT DETECTED NOT DETECTED Final   Chlamydophila pneumoniae NOT DETECTED NOT DETECTED Final   Mycoplasma pneumoniae NOT DETECTED NOT DETECTED Final    Comment: Performed at [facility], [place] [address], [place], [place] [address]  MRSA Next Gen by PCR, Nasal     Status: None   Collection Time: 02/22/22  3:33 AM   Specimen: STOOL; Nasal Swab  Result Value Ref Range Status   MRSA by PCR Next Gen NOT DETECTED NOT DETECTED Final    Comment: (NOTE) The GeneXpert MRSA Assay (FDA approved for NASAL specimens only), is one component of  a comprehensive MRSA colonization surveillance program. It is not intended to diagnose MRSA infection nor to guide or monitor treatment for MRSA infections. Test performance is not FDA approved in patients less than 32 years old. Performed at [facility], [place] [address], [place], [place] [address]   Culture, blood (Routine X 2) w Reflex to ID Panel     Status: Abnormal   Collection Time: 02/23/22  2:43 AM   Specimen: BLOOD  Result Value Ref Range Status   Specimen Description BLOOD SITE NOT SPECIFIED  Final   Special Requests   Final    BOTTLES DRAWN AEROBIC AND ANAEROBIC Blood Culture results may not be optimal due to an inadequate volume of blood received in culture bottles   Culture  Setup Time   Final    GRAM POSITIVE COCCI IN CLUSTERS AEROBIC BOTTLE ONLY CRITICAL RESULT CALLED TO, READ BACK BY AND VERIFIED WITH: PHARMD MICHAEL [place] ON 02/23/22 @ 2027 BY DRT    Culture (A)  Final    STAPHYLOCOCCUS AUREUS SUSCEPTIBILITIES PERFORMED ON PREVIOUS CULTURE WITHIN THE LAST 5 DAYS. Performed at [facility], [place] [address], [place], [place] [address]    Report Status 02/24/2022 FINAL  Final  Culture, blood (Routine X 2) w Reflex to ID Panel     Status: None (Preliminary result)    Collection Time: 02/23/22  2:43 AM   Specimen: BLOOD  Result Value Ref Range Status   Specimen Description BLOOD SITE NOT SPECIFIED  Final   Special Requests   Final    BOTTLES DRAWN AEROBIC AND ANAEROBIC Blood Culture adequate volume   Culture   Final    NO GROWTH 2 DAYS Performed at [facility], [place] [address] [place], [place] [address]    Report Status PENDING  Incomplete     Medications:    amiodarone  200 mg Oral q morning   carvedilol  3.125 mg Oral BID WC   Chlorhexidine Gluconate Cloth  6 each Topical Q0600   Chlorhexidine Gluconate Cloth  6 each Topical Q0600   darbepoetin (ARANESP) injection - DIALYSIS  200 mcg Intravenous Q Thu-HD   escitalopram  10 mg Oral q morning   feeding supplement  237 mL Oral BID BM   lidocaine  3 patch Transdermal Q24H   sodium chloride flush  3 mL Intravenous Q12H   tamsulosin  0.4 mg Oral QHS   Continuous Infusions:  sodium chloride      ceFAZolin (ANCEF) IV 1 g (02/25/22 1003)   [START ON 02/26/2022]  ceFAZolin (ANCEF) IV        LOS: 3 days   Charlynne Cousins  [facility]  02/25/2022, 10:33 AM

## 2022-02-25 NOTE — Care Management Important Message (Signed)
Important Message  Patient Details  Name: Darryl Diaz MRN: [id number] Date of Birth: [birth year]   Medicare Important Message Given:  Yes  Due to illness patient could not sign.  Signature copy left with the patient.     Melodye Swor 02/25/2022, 2:29 PM

## 2022-02-25 NOTE — Progress Notes (Signed)
Pharmacy Antibiotic Note  Darryl Diaz is a 81 y.o. 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 Pharmacy has been consulted for Cefazolin dosing.  The patient is ESRD-T/Th/Sat and received HD on 10/19 afternoon. He received his post-HD Cefazolin dose pre-HD, so will schedule an additional dose this AM and resume his schedule tomorrow.   Plan: - Cefazolin 1g IV x 1 dose now - Restart Cefazolin 2g on T/Th/Sat at 1800 starting on 10/21 - Will follow up results of TEE and additional work-up - Will continue to follow HD schedule/duration  Height: 6' (182.9 cm) Weight:  (unable to get patient on ER stretcher & unable to stand) IBW/kg (Calculated) : 77.6  Temp (24hrs), Avg:98.2 F (36.8 C), Min:97.8 F (36.6 C), Max:98.5 F (36.9 C)  Recent Labs  Lab 02/20/22 2308 02/21/22 2009 02/21/22 2300 02/22/22 0230 02/23/22 0243 02/25/22 0322  WBC 5.7 7.9  --   --  7.9 5.5  CREATININE 3.69* 2.05*  --  2.32* 2.17* 2.03*  LATICACIDVEN  --  2.8* 2.0* 1.4  --   --     Estimated Creatinine Clearance: 31.9 mL/min (A) (by C-G formula based on SCr of 2.03 mg/dL (H)).    Allergies  Allergen Reactions   Penicillin G Sodium Rash and Other (See Comments)     (tolerates Keflex)  NOT DOCUMENTED ON THE MAR   Penicillins Rash    NOT DOCUMENTED ON THE MAR    Antimicrobials this admission: Doxy 10/16 x 1 Cefepime 10/16 x 1 Vancomycin 10/17 x 1 Cefazolin 10/17 >>  Dose adjustments this admission: Extra 1g dose on 10/20 due to 10/19 maintenance dose being given pre-HD instead of post-HD  Microbiology results: 10/16 (am) BCx >> 2/2 MSSA 10/16 (pm) BCX >> 4/4 MSSA 10/17 UCx >> insignificant growth 10/18 BCx >> 1/4 MSSA  Thank you for allowing pharmacy to be a part of this patient's care.  Alycia Rossetti, PharmD, BCPS Infectious Diseases Clinical Pharmacist 02/25/2022 8:04 AM   **Pharmacist phone directory  can now be found on [email or web address] ([email or web address]).  Listed under [place].

## 2022-02-25 NOTE — Progress Notes (Addendum)
I have seen and examined the patient. I have personally reviewed the clinical findings, laboratory findings, microbiological data and imaging studies. The assessment and treatment plan was discussed with the Nurse Practitioner Mauricio Po. I agree with her/his recommendations except following additions/corrections.  Having breakfast, no complains otherwise Afebrile No concerns for metastatic involvement at his left ankle, bilateral hip arthroplasty and left AVF   10/18 blood cx 1/2 sets staph aureus 10/20 blood cx ordered   No plans for operative intervention  Poor TEE candidate  Plan to continue cefazolin with HD for 6-8 weeks course following blood culture clearance  Monitor for metastatic sites of infection   Dr Tommy Medal covering this weekend and will follow cultures. New ID team to follow starting Monday  Darryl Oz, MD Infectious Disease Physician [facility] [address] [place], [place] [address] Phone: [phone]  Fax: [facility] Infect[facility]  Date of Admission:  02/21/2022     Total days of antibiotics 5         ASSESSMENT:  Darryl Diaz blood cultures from 10/17 have turned positive. Will need to repeat blood cultures for clearance of bacteremia. TEE for today cancelled secondary C1 articular facet fracture. Can defer TEE as it will not have a significant impact on plan of care as he will need at least 6 weeks of treatment going forward. Continue current dose of Cefazolin and monitor cultures for clearance of MSSA bacteremia. Remaining medical and supportive care per primary team.   PLAN:  Continue current dose of Cefazolin Obtain new blood cultures to ensure clearance of bacteremia.  Remaining medical and supportive care per primary team.   Dr. Tommy Medal is available over the weekend for any ID related questions or concerns.   Principal Problem:   Sepsis ([place]) Active  Problems:   Hypertension   Hyperlipidemia   GERD (gastroesophageal reflux disease)   Paroxysmal atrial fibrillation (HCC)   Debility   Diarrhea   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HCC)   CAD (coronary artery disease)   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HCC)   Acute hyponatremia   Elevated troponin   Cellulitis   Prolonged QT interval   CAP (community acquired pneumonia)   Staphylococcus aureus bacteremia   MSSA bacteremia    amiodarone  200 mg Oral q morning   carvedilol  3.125 mg Oral BID WC   Chlorhexidine Gluconate Cloth  6 each Topical Q0600   Chlorhexidine Gluconate Cloth  6 each Topical Q0600   darbepoetin (ARANESP) injection - DIALYSIS  200 mcg Intravenous Q Thu-HD   escitalopram  10 mg Oral q morning   feeding supplement  237 mL Oral BID BM   lidocaine  3 patch Transdermal Q24H   sodium chloride flush  3 mL Intravenous Q12H   tamsulosin  0.4 mg Oral QHS    SUBJECTIVE:  Afebrile overnight with no acute events. Blood cultures from 10/17 positive. Doing okay.  Allergies  Allergen Reactions   Penicillin G Sodium Rash and Other (See Comments)     (tolerates Keflex)  NOT DOCUMENTED ON THE MAR   Penicillins Rash    NOT DOCUMENTED ON THE MAR     Review of Systems: Review of Systems  Constitutional:  Negative for chills, fever and weight loss.  Respiratory:  Negative for cough, shortness of breath and wheezing.   Cardiovascular:  Negative for chest pain and leg swelling.  Gastrointestinal:  Negative  for abdominal pain, constipation, diarrhea, nausea and vomiting.  Skin:  Negative for rash.      OBJECTIVE: Vitals:   02/24/22 1800 02/24/22 1842 02/25/22 0412 02/25/22 0816  BP: (!) 105/54 (!) 102/48 (!) 119/57 137/60  Pulse: 67 66 80 80  Resp:  '14 18 20  '$ Temp:   98.5 F (36.9 C) 98.3 F (36.8 C)  TempSrc:   Oral Oral  SpO2: 97% 92% (!) 87% (!) 84%  Weight:      Height:       Body mass index is 23.19 kg/m.  Physical Exam Constitutional:       General: He is not in acute distress.    Appearance: He is well-developed.     Comments: Seated in bed eating breakfast; C-collar in place.   Cardiovascular:     Rate and Rhythm: Normal rate and regular rhythm.     Heart sounds: Normal heart sounds.  Pulmonary:     Effort: Pulmonary effort is normal.     Breath sounds: Normal breath sounds.  Skin:    General: Skin is warm and dry.  Neurological:     Mental Status: He is alert.  Psychiatric:        Mood and Affect: Mood normal.     Lab Results Lab Results  Component Value Date   WBC 5.5 02/25/2022   HGB 7.5 (L) 02/25/2022   HCT 22.1 (L) 02/25/2022   MCV 101.8 (H) 02/25/2022   PLT 60 (L) 02/25/2022    Lab Results  Component Value Date   CREATININE 2.03 (H) 02/25/2022   BUN 22 02/25/2022   NA 136 02/25/2022   K 3.4 (L) 02/25/2022   CL 97 (L) 02/25/2022   CO2 28 02/25/2022    Lab Results  Component Value Date   ALT 19 02/22/2022   AST 37 02/22/2022   ALKPHOS 62 02/22/2022   BILITOT 1.6 (H) 02/22/2022     Microbiology: Recent Results (from the past 240 hour(s))  SARS Coronavirus 2 by RT PCR (hospital order, performed in [place] hospital lab) *cepheid single result test* Anterior Nasal Swab     Status: None   Collection Time: 02/21/22  3:16 AM   Specimen: Anterior Nasal Swab  Result Value Ref Range Status   SARS Coronavirus 2 by RT PCR NEGATIVE NEGATIVE Final    Comment: (NOTE) SARS-CoV-2 target nucleic acids are NOT DETECTED.  The SARS-CoV-2 RNA is generally detectable in upper and lower respiratory specimens during the acute phase of infection. The lowest concentration of SARS-CoV-2 viral copies this assay can detect is 250 copies / mL. A negative result does not preclude SARS-CoV-2 infection and should not be used as the sole basis for treatment or other patient management decisions.  A negative result may occur with improper specimen collection / handling, submission of specimen other than  nasopharyngeal swab, presence of viral mutation(s) within the areas targeted by this assay, and inadequate number of viral copies (<250 copies / mL). A negative result must be combined with clinical observations, patient history, and epidemiological information.  Fact Sheet for Patients:   https://www.patel.info/  Fact Sheet for Healthcare Providers: https://hall.com/  This test is not yet approved or  cleared by the [place] FDA and has been authorized for detection and/or diagnosis of SARS-CoV-2 by FDA under an Emergency Use Authorization (EUA).  This EUA will remain in effect (meaning this test can be used) for the duration of the COVID-19 declaration under Section 564(b)(1) of the Act, 21  U.S.C. section 360bbb-3(b)(1), unless the authorization is terminated or revoked sooner.  Performed at [facility], [place] [address] [place], [place] [address]   Blood culture (routine x 2)     Status: Abnormal   Collection Time: 02/21/22  4:07 AM   Specimen: BLOOD RIGHT HAND  Result Value Ref Range Status   Specimen Description BLOOD RIGHT HAND  Final   Special Requests AEROBIC BOTTLE ONLY Blood Culture adequate volume  Final   Culture  Setup Time   Final    GRAM POSITIVE COCCI IN CLUSTERS AEROBIC BOTTLE ONLY CRITICAL RESULT CALLED TO, READ BACK BY AND VERIFIED WITH: [place] ON 02/21/22 @ 2022 BY DRT Performed at [facility], [place] [address], [place], [place] [address]    Culture STAPHYLOCOCCUS AUREUS (A)  Final   Report Status 02/23/2022 FINAL  Final   Organism ID, Bacteria STAPHYLOCOCCUS AUREUS  Final      Susceptibility   Staphylococcus aureus - MIC*    CIPROFLOXACIN <=0.5 SENSITIVE Sensitive     ERYTHROMYCIN >=8 RESISTANT Resistant     GENTAMICIN <=0.5 SENSITIVE Sensitive     OXACILLIN <=0.25 SENSITIVE Sensitive     TETRACYCLINE >=16 RESISTANT Resistant     VANCOMYCIN <=0.5 SENSITIVE Sensitive      TRIMETH/SULFA <=10 SENSITIVE Sensitive     CLINDAMYCIN RESISTANT Resistant     RIFAMPIN <=0.5 SENSITIVE Sensitive     Inducible Clindamycin POSITIVE Resistant     * STAPHYLOCOCCUS AUREUS  Blood Culture ID Panel (Reflexed)     Status: Abnormal   Collection Time: 02/21/22  4:07 AM  Result Value Ref Range Status   Enterococcus faecalis NOT DETECTED NOT DETECTED Final   Enterococcus Faecium NOT DETECTED NOT DETECTED Final   Listeria monocytogenes NOT DETECTED NOT DETECTED Final   Staphylococcus species DETECTED (A) NOT DETECTED Final    Comment: CRITICAL RESULT CALLED TO, READ BACK BY AND VERIFIED WITH: PHARM CATHY PEARCE ON 02/21/22 @ 2022 BY DRT    Staphylococcus aureus (BCID) DETECTED (A) NOT DETECTED Final    Comment: CRITICAL RESULT CALLED TO, READ BACK BY AND VERIFIED WITH: PHARM CATHY PEARCE ON 02/21/22 @ 2022 BY DRT    Staphylococcus epidermidis NOT DETECTED NOT DETECTED Final   Staphylococcus lugdunensis NOT DETECTED NOT DETECTED Final   Streptococcus species NOT DETECTED NOT DETECTED Final   Streptococcus agalactiae NOT DETECTED NOT DETECTED Final   Streptococcus pneumoniae NOT DETECTED NOT DETECTED Final   Streptococcus pyogenes NOT DETECTED NOT DETECTED Final   A.calcoaceticus-baumannii NOT DETECTED NOT DETECTED Final   Bacteroides fragilis NOT DETECTED NOT DETECTED Final   Enterobacterales NOT DETECTED NOT DETECTED Final   Enterobacter cloacae complex NOT DETECTED NOT DETECTED Final   Escherichia coli NOT DETECTED NOT DETECTED Final   Klebsiella aerogenes NOT DETECTED NOT DETECTED Final   Klebsiella oxytoca NOT DETECTED NOT DETECTED Final   Klebsiella pneumoniae NOT DETECTED NOT DETECTED Final   Proteus species NOT DETECTED NOT DETECTED Final   Salmonella species NOT DETECTED NOT DETECTED Final   Serratia marcescens NOT DETECTED NOT DETECTED Final   Haemophilus influenzae NOT DETECTED NOT DETECTED Final   Neisseria meningitidis NOT DETECTED NOT DETECTED Final    Pseudomonas aeruginosa NOT DETECTED NOT DETECTED Final   Stenotrophomonas maltophilia NOT DETECTED NOT DETECTED Final   Candida albicans NOT DETECTED NOT DETECTED Final   Candida auris NOT DETECTED NOT DETECTED Final   Candida glabrata NOT DETECTED NOT DETECTED Final   Candida krusei NOT DETECTED NOT DETECTED  Final   Candida parapsilosis NOT DETECTED NOT DETECTED Final   Candida tropicalis NOT DETECTED NOT DETECTED Final   Cryptococcus neoformans/gattii NOT DETECTED NOT DETECTED Final   Meth resistant mecA/C and MREJ NOT DETECTED NOT DETECTED Final    Comment: Performed at [facility], [place] [address], [place], [place] [address]  Blood culture (routine x 2)     Status: Abnormal   Collection Time: 02/21/22  6:19 AM   Specimen: BLOOD LEFT HAND  Result Value Ref Range Status   Specimen Description BLOOD LEFT HAND  Final   Special Requests AEROBIC BOTTLE ONLY Blood Culture adequate volume  Final   Culture  Setup Time   Final    GRAM POSITIVE COCCI IN CLUSTERS AEROBIC BOTTLE ONLY CRITICAL VALUE NOTED.  VALUE IS CONSISTENT WITH PREVIOUSLY REPORTED AND CALLED VALUE.    Culture (A)  Final    STAPHYLOCOCCUS AUREUS SUSCEPTIBILITIES PERFORMED ON PREVIOUS CULTURE WITHIN THE LAST 5 DAYS. Performed at [facility], [place] [address] [place], [place] [address]    Report Status 02/23/2022 FINAL  Final  Blood Culture (routine x 2)     Status: Abnormal   Collection Time: 02/21/22  8:42 PM   Specimen: BLOOD RIGHT FOREARM  Result Value Ref Range Status   Specimen Description BLOOD RIGHT FOREARM  Final   Special Requests   Final    BOTTLES DRAWN AEROBIC AND ANAEROBIC Blood Culture adequate volume   Culture  Setup Time   Final    GRAM POSITIVE COCCI IN CLUSTERS IN BOTH AEROBIC AND ANAEROBIC BOTTLES CRITICAL VALUE NOTED.  VALUE IS CONSISTENT WITH PREVIOUSLY REPORTED AND CALLED VALUE.    Culture (A)  Final    STAPHYLOCOCCUS AUREUS SUSCEPTIBILITIES PERFORMED ON PREVIOUS CULTURE WITHIN THE  LAST 5 DAYS. Performed at [facility], [place] [address] [place], [place] [address]    Report Status 02/24/2022 FINAL  Final  Blood Culture (routine x 2)     Status: Abnormal   Collection Time: 02/21/22  8:49 PM   Specimen: BLOOD  Result Value Ref Range Status   Specimen Description BLOOD RIGHT ANTECUBITAL  Final   Special Requests   Final    BOTTLES DRAWN AEROBIC AND ANAEROBIC Blood Culture adequate volume   Culture  Setup Time   Final    GRAM POSITIVE COCCI IN CLUSTERS IN BOTH AEROBIC AND ANAEROBIC BOTTLES CRITICAL RESULT CALLED TO, READ BACK BY AND VERIFIED WITH: PHARMD E [place] [id number] AT 1234 BY CM    Culture (A)  Final    STAPHYLOCOCCUS AUREUS SUSCEPTIBILITIES PERFORMED ON PREVIOUS CULTURE WITHIN THE LAST 5 DAYS. Performed at [facility], [place] [address], [place], [place] [address]    Report Status 02/24/2022 FINAL  Final  Urine Culture     Status: Abnormal   Collection Time: 02/22/22  3:28 AM   Specimen: Urine, Clean Catch  Result Value Ref Range Status   Specimen Description URINE, CLEAN CATCH  Final   Special Requests NONE  Final   Culture (A)  Final    <10,000 COLONIES/mL INSIGNIFICANT GROWTH Performed at [facility], [place] [address] [place], [place] [address]    Report Status 02/23/2022 FINAL  Final  Resp Panel by RT-PCR (Flu A&B, Covid) Anterior Nasal Swab     Status: None   Collection Time: 02/22/22  3:28 AM   Specimen: Anterior Nasal Swab  Result Value Ref Range Status   SARS Coronavirus 2 by RT PCR NEGATIVE NEGATIVE Final  Comment: (NOTE) SARS-CoV-2 target nucleic acids are NOT DETECTED.  The SARS-CoV-2 RNA is generally detectable in upper respiratory specimens during the acute phase of infection. The lowest concentration of SARS-CoV-2 viral copies this assay can detect is 138 copies/mL. A negative result does not preclude SARS-Cov-2 infection and should not be used as the sole basis for treatment or other patient management  decisions. A negative result may occur with  improper specimen collection/handling, submission of specimen other than nasopharyngeal swab, presence of viral mutation(s) within the areas targeted by this assay, and inadequate number of viral copies(<138 copies/mL). A negative result must be combined with clinical observations, patient history, and epidemiological information. The expected result is Negative.  Fact Sheet for Patients:  EntrepreneurPulse.com.au  Fact Sheet for Healthcare Providers:  IncredibleEmployment.be  This test is no t yet approved or cleared by the [place] FDA and  has been authorized for detection and/or diagnosis of SARS-CoV-2 by FDA under an Emergency Use Authorization (EUA). This EUA will remain  in effect (meaning this test can be used) for the duration of the COVID-19 declaration under Section 564(b)(1) of the Act, 21 U.S.C.section 360bbb-3(b)(1), unless the authorization is terminated  or revoked sooner.       Influenza A by PCR NEGATIVE NEGATIVE Final   Influenza B by PCR NEGATIVE NEGATIVE Final    Comment: (NOTE) The Xpert Xpress SARS-CoV-2/FLU/RSV plus assay is intended as an aid in the diagnosis of influenza from Nasopharyngeal swab specimens and should not be used as a sole basis for treatment. Nasal washings and aspirates are unacceptable for Xpert Xpress SARS-CoV-2/FLU/RSV testing.  Fact Sheet for Patients: EntrepreneurPulse.com.au  Fact Sheet for Healthcare Providers: IncredibleEmployment.be  This test is not yet approved or cleared by the [place] FDA and has been authorized for detection and/or diagnosis of SARS-CoV-2 by FDA under an Emergency Use Authorization (EUA). This EUA will remain in effect (meaning this test can be used) for the duration of the COVID-19 declaration under Section 564(b)(1) of the Act, 21 U.S.C. section 360bbb-3(b)(1), unless the  authorization is terminated or revoked.  Performed at [facility], [place] [address], [place], [place] [address]   Respiratory (~20 pathogens) panel by PCR     Status: None   Collection Time: 02/22/22  3:28 AM   Specimen: Urine, Clean Catch; Respiratory  Result Value Ref Range Status   Adenovirus NOT DETECTED NOT DETECTED Final   Coronavirus 229E NOT DETECTED NOT DETECTED Final    Comment: (NOTE) The Coronavirus on the Respiratory Panel, DOES NOT test for the novel  Coronavirus (2019 nCoV)    Coronavirus HKU1 NOT DETECTED NOT DETECTED Final   Coronavirus NL63 NOT DETECTED NOT DETECTED Final   Coronavirus OC43 NOT DETECTED NOT DETECTED Final   Metapneumovirus NOT DETECTED NOT DETECTED Final   Rhinovirus / Enterovirus NOT DETECTED NOT DETECTED Final   Influenza A NOT DETECTED NOT DETECTED Final   Influenza B NOT DETECTED NOT DETECTED Final   Parainfluenza Virus 1 NOT DETECTED NOT DETECTED Final   Parainfluenza Virus 2 NOT DETECTED NOT DETECTED Final   Parainfluenza Virus 3 NOT DETECTED NOT DETECTED Final   Parainfluenza Virus 4 NOT DETECTED NOT DETECTED Final   Respiratory Syncytial Virus NOT DETECTED NOT DETECTED Final   Bordetella pertussis NOT DETECTED NOT DETECTED Final   Bordetella Parapertussis NOT DETECTED NOT DETECTED Final   Chlamydophila pneumoniae NOT DETECTED NOT DETECTED Final   Mycoplasma pneumoniae NOT DETECTED NOT DETECTED Final    Comment: Performed at  [facility], [place] [address] [place], [place] [address]  MRSA Next Gen by PCR, Nasal     Status: None   Collection Time: 02/22/22  3:33 AM   Specimen: STOOL; Nasal Swab  Result Value Ref Range Status   MRSA by PCR Next Gen NOT DETECTED NOT DETECTED Final    Comment: (NOTE) The GeneXpert MRSA Assay (FDA approved for NASAL specimens only), is one component of a comprehensive MRSA colonization surveillance program. It is not intended to diagnose MRSA infection nor to guide or monitor treatment for MRSA  infections. Test performance is not FDA approved in patients less than 15 years old. Performed at [facility], [place] [address], [place], [place] [address]   Culture, blood (Routine X 2) w Reflex to ID Panel     Status: Abnormal   Collection Time: 02/23/22  2:43 AM   Specimen: BLOOD  Result Value Ref Range Status   Specimen Description BLOOD SITE NOT SPECIFIED  Final   Special Requests   Final    BOTTLES DRAWN AEROBIC AND ANAEROBIC Blood Culture results may not be optimal due to an inadequate volume of blood received in culture bottles   Culture  Setup Time   Final    GRAM POSITIVE COCCI IN CLUSTERS AEROBIC BOTTLE ONLY CRITICAL RESULT CALLED TO, READ BACK BY AND VERIFIED WITH: PHARMD MICHAEL [place] ON 02/23/22 @ 2027 BY DRT    Culture (A)  Final    STAPHYLOCOCCUS AUREUS SUSCEPTIBILITIES PERFORMED ON PREVIOUS CULTURE WITHIN THE LAST 5 DAYS. Performed at [facility], [place] [address], [place], [place] [address]    Report Status 02/24/2022 FINAL  Final  Culture, blood (Routine X 2) w Reflex to ID Panel     Status: None (Preliminary result)   Collection Time: 02/23/22  2:43 AM   Specimen: BLOOD  Result Value Ref Range Status   Specimen Description BLOOD SITE NOT SPECIFIED  Final   Special Requests   Final    BOTTLES DRAWN AEROBIC AND ANAEROBIC Blood Culture adequate volume   Culture   Final    NO GROWTH 2 DAYS Performed at [facility], [place] [address] [place], [place] [address]    Report Status PENDING  Incomplete   Imaging No results found.   Terri Piedra, NP [facility]  02/25/2022  11:15 AM

## 2022-02-25 NOTE — Progress Notes (Signed)
PT Cancellation Note  Patient Details Name: Darryl Diaz MRN: [id number] DOB: 08-05-1940   Cancelled Treatment:    Reason Eval/Treat Not Completed: Patient declined, no reason specified - Pt states "I've been bombarded all day, I just want to eat" and declines EOB or OOB attempt. PT to check back as schedule allows.  Stacie Glaze, PT DPT Acute Rehabilitation Services Pager [phone]  Office [phone]    Louis Matte 02/25/2022, 2:19 PM

## 2022-02-25 NOTE — Progress Notes (Signed)
Pt with no SNF bed offers at this time, multiple denials. Expanded search and will f/u with offers as available.   Wandra Feinstein, MSW, LCSW [phone] (coverage)

## 2022-02-26 DIAGNOSIS — I2510 Atherosclerotic heart disease of native coronary artery without angina pectoris: Secondary | ICD-10-CM | POA: Diagnosis not present

## 2022-02-26 DIAGNOSIS — A419 Sepsis, unspecified organism: Secondary | ICD-10-CM | POA: Diagnosis not present

## 2022-02-26 DIAGNOSIS — R6520 Severe sepsis without septic shock: Secondary | ICD-10-CM | POA: Diagnosis not present

## 2022-02-26 LAB — MAGNESIUM: Magnesium: 1.7 mg/dL (ref 1.7–2.4)

## 2022-02-26 LAB — RENAL FUNCTION PANEL
Albumin: 1.9 g/dL — ABNORMAL LOW (ref 3.5–5.0)
Anion gap: 8 (ref 5–15)
BUN: 38 mg/dL — ABNORMAL HIGH (ref 8–23)
CO2: 28 mmol/L (ref 22–32)
Calcium: 7.6 mg/dL — ABNORMAL LOW (ref 8.9–10.3)
Chloride: 99 mmol/L (ref 98–111)
Creatinine, Ser: 3.03 mg/dL — ABNORMAL HIGH (ref 0.61–1.24)
GFR, Estimated: 20 mL/min — ABNORMAL LOW (ref >60)
Glucose, Bld: 121 mg/dL — ABNORMAL HIGH (ref 70–99)
Phosphorus: 2.6 mg/dL (ref 2.5–4.6)
Potassium: 3.3 mmol/L — ABNORMAL LOW (ref 3.5–5.1)
Sodium: 135 mmol/L (ref 135–145)

## 2022-02-26 LAB — GLUCOSE, CAPILLARY: Glucose-Capillary: 106 mg/dL — ABNORMAL HIGH (ref 70–99)

## 2022-02-26 NOTE — Progress Notes (Signed)
[facility] Progress Note   Subjective:   arousable this AM-  not oriented to time or place  "this is the damndest thing"  due for HD later today -  noticed he is on NS at 75 per hour   Objective Vitals:   02/25/22 1349 02/25/22 1747 02/25/22 2029 02/26/22 0357  BP: (!) 94/50 (!) 101/50 (!) 117/55 (!) 103/50  Pulse: 65 67 71 74  Resp: _0 Temp: 98.6 F (37 C) 97.7 F (36.5 C) 97.9 F (36.6 C) 97.8 F (36.6 C)  TempSrc: Oral Oral  Oral  SpO2: 100% 96% 93% 92%  Weight:      Height:       Physical Exam General: chronically ill appearing male in neck collar-  not oriented to place or time Heart:RRR. No murmurs, rubs or gallops Lungs: CTA bilaterally. No wheeze, rales or rhonchi. Breathing is unlabored.  Abdomen:Soft, mild tenderness to LLQ with palpation, no guarding or rebound tenderness Extremities: No edema, ischemic changes, or open wounds Dialysis Access: LUE AVF   Filed Weights   02/21/22 1959  Weight: 77.6 kg    Intake/Output Summary (Last 24 hours) at 02/26/2022 0716 Last data filed at 02/26/2022 0000 Gross per 24 hour  Intake 651.25 ml  Output 500 ml  Net 151.25 ml     Additional Objective Labs: Basic Metabolic Panel: Recent Labs  Lab 02/22/22 0230 02/22/22 0243 02/23/22 0243 02/25/22 0322  NA 135 135 138 136  K 3.0* 2.9* 3.6 3.4*  CL 100  --  99 97*  CO2 25  --  25 28  GLUCOSE 96  --  99 99  BUN 24*  --  22 22  CREATININE 2.32*  --  2.17* 2.03*  CALCIUM 7.9*  --  8.1* 7.9*  PHOS 2.4*  --   --  1.4*   Liver Function Tests: Recent Labs  Lab 02/20/22 2308 02/21/22 2009 02/22/22 0230 02/25/22 0322  AST 28 40 37  --   ALT _1 --   ALKPHOS 103 76 62  --   BILITOT 1.4* 1.6* 1.6*  --   PROT 5.5* 5.2* 4.8*  --   ALBUMIN 3.0* 2.6* 2.4* 1.9*   CBC: Recent Labs  Lab 02/20/22 2308 02/21/22 2009 02/22/22 0243 02/23/22 0243 02/25/22 0322  WBC 5.7 7.9  --  7.9 5.5  NEUTROABS 4.8 6.8  --   --   --   HGB 9.3*  8.4* 7.1* 7.6* 7.5*  HCT 26.4* 23.9* 21.0* 22.6* 22.1*  MCV 101.5* 100.8*  --  104.1* 101.8*  PLT 93* 83*  --  73* 60*   Blood Culture    Component Value Date/Time   SDES BLOOD SITE NOT SPECIFIED 02/23/2022 0243   SDES BLOOD SITE NOT SPECIFIED 02/23/2022 0243   SPECREQUEST  02/23/2022 0243    BOTTLES DRAWN AEROBIC AND ANAEROBIC Blood Culture results may not be optimal due to an inadequate volume of blood received in culture bottles   SPECREQUEST  02/23/2022 0243    BOTTLES DRAWN AEROBIC AND ANAEROBIC Blood Culture adequate volume   CULT (A) 02/23/2022 0243    STAPHYLOCOCCUS AUREUS SUSCEPTIBILITIES PERFORMED ON PREVIOUS CULTURE WITHIN THE LAST 5 DAYS. Performed at [facility], [place] [address] [place], [place] [address]    CULT  02/23/2022 0243    NO GROWTH 2 DAYS Performed at [facility], [place] [address] [place] [employer], [place] [address]    REPTSTATUS 02/24/2022 FINAL 02/23/2022  0243   REPTSTATUS PENDING 02/23/2022 0243   Cardiac Enzymes: Recent Labs  Lab 02/22/22 0230  CKTOTAL 256   Iron Studies:  No results for input(s): "IRON", "TIBC", "TRANSFERRIN", "FERRITIN" in the last 72 hours.  Lab Results  Component Value Date   INR 1.4 (H) 02/22/2022   INR 1.4 (H) 02/21/2022   INR 1.1 11/23/2021   Studies/Results: No results found.  Medications:  sodium chloride     sodium chloride 75 mL/hr at 02/25/22 2018    ceFAZolin (ANCEF) IV      amiodarone  200 mg Oral q morning   carvedilol  3.125 mg Oral BID WC   Chlorhexidine Gluconate Cloth  6 each Topical Q0600   darbepoetin (ARANESP) injection - DIALYSIS  200 mcg Intravenous Q Thu-HD   escitalopram  10 mg Oral q morning   feeding supplement  237 mL Oral BID BM   lidocaine  3 patch Transdermal Q24H   phosphorus  250 mg Oral BID   sodium chloride flush  3 mL Intravenous Q12H   tamsulosin  0.4 mg Oral QHS    Dialysis Orders: TTS - NW 4hrs, BFR 400/AF 1.5,  EDW 79.8 kg, 3.0 K/ 2.5 Ca   Access: LU AVF  No  Heparin  Mircera 150 mcg q2wks - last 9/27  Assessment/Plan: Sepsis due to gram-positive bacteremia in setting of left foot cellulitis and left foot osteomyelitis - Management per primary team -SIRS criteria met with tachycardia, fever, RR >20 -Received ~2L lactated ringers in ED.  -CT with likely PNA.  Started on broad spectrum ABX. ID consulted and ABX narrowed to IV cefazolin.  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 No epidural abscess.  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 Sharol Given with orthopedics. No surgical intervention indicated at this time - Does not think foot is cause of sepsis  Elevated troponin   -No ischemic evaluation needed at this time.     4. Hypokalemia High k bath with HD   7. ESRD  -On HD TTS-  Thursday there were issues due to pt dementia-  hope that this is due to acute illness -  if does not clear he cannot be dialyzed in the OP setting like this-  would not be safe  -AVF does not appear to be source of bacteremia Plan for next HD today               8. Hypertension/volume   - Allow permissive HTN per primary team - Coreg held on admission - Does not appear fluid overloaded.  now under his EDW-  unable to remove anything with HD 10/19 due to low BP-  will stop the NS at 75 per hour   9. Anemia of CKD  - Hgb 7.6 - ESA  increased dose to 258mg qwk - tsat 10%, ferritin 1700, no IV iron.  - Transfuse if Hbg <7.0   10. Secondary Hyperparathyroidism  - Ca 7.9, CCa 9.2, Phos 2.4-  last phos  actually 1.4-  needed repletion  - Not on VDRA or binders.    11. Dispo-  consider another palliative intervention -  pt clearly failing to thrive   [facility] 02/26/2022,7:16 AM  LOS: 4 days

## 2022-02-26 NOTE — Progress Notes (Signed)
   02/26/22 2112  Vitals  Temp 98.1 F (36.7 C)  BP (!) 144/60  MAP (mmHg) 84  BP Location Right Arm  BP Method Automatic  Patient Position (if appropriate) Lying  Pulse Rate 73  Pulse Rate Source Monitor  ECG Heart Rate 73  Resp (!) 25  Post Treatment  Dialyzer Clearance Lightly streaked  Duration of HD Treatment -hour(s) 3.26 hour(s)  Hemodialysis Intake (mL) 0 mL  Liters Processed 62.1  Fluid Removed 800 mL  Tolerated HD Treatment Yes  Post-Hemodialysis Comments pt tolerated tx. pt started pulling on lines, VP was too high. pt was stopping me from fixing needles. pt tx ended early due to this. pt is sundowning. vitals/pt are stable. RN gave report. awaiting transport  AVG/AVF Arterial Site Held (minutes) 10 minutes  AVG/AVF Venous Site Held (minutes) 10 minutes   TX fin. PT off 4 min. Early, due PT movement stopping TX flow and [place].

## 2022-02-26 NOTE — Progress Notes (Signed)
Pt seems to get confused at night time so It might be better if he is a 1 shift patient. Pt gets confused and tries to pull on lines and neck brace. Sometimes easily redirected and other times not.

## 2022-02-26 NOTE — Progress Notes (Signed)
[facility] PROGRESS NOTE    Progress Note  GRAYLON AMORY  YTK:[id number] DOB: 05/22/1940 DOA: 02/21/2022 PCP: [name], MD     Brief Narrative:   SHEROD CISSE is an 81 y.o. 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 Blood cultures repeated on 02/25/2022 hopefully he will clear the infection. Initially started on IV empiric antibiotics, ID was consulted now has been transitioned to IV cefazolin.   Has remained afebrile. MRI of the C-spine showed discitis and osteomyelitis with possible abscess C6-C7 TEE cannot be performed due to his C1 articular facet fracture.  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 Sharol Given has been notified for left foot osteomyelitis. Currently on empiric antibiotics. Ortho relates the foot does not appear to be the cause of sepsis.  Hypokalemia: Nephrology has been consulted, further management per hemodialysis.  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 Hemoglobin has remained relatively stable at 7.5-7.6.  C1 articular facet fracture: Currently in a c-collar. Cardiology will proceed to attempt  TEE  Elevated troponins: In the setting of sepsis and end-stage renal disease, likely demand ischemia he denies any chest pain twelve-lead EKG showed no signs of ischemia.  Mild aortic stenosis: Mean gradient of 12 continue to monitor as an outpatient.  Mild hyponatremia: Further management per renal with dialysis. Now resolved.  Diarrhea: Has had no further diarrhea in house.  Paroxysmal atrial fibrillation: On anticoagulation rate control continue amiodarone.  Essential hypertension: Likely due to sepsis permissive hypertension was allowed his blood pressure has remained relatively stable. Continue Coreg.  Hyperlipidemia: Continue statins.  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  Prolonged QTc: Noted continue to monitor on telemetry.    DVT prophylaxis: lovenox Family Communication:none Status is: Inpatient Remains inpatient appropriate because: Likely back to SNF after infectious work-up and treatment has been completed.    Code Status:     Code Status Orders  (From admission, onward)           Start     Ordered   02/22/22 0243  Do not attempt resuscitation (DNR)  Continuous       Question Answer Comment  In the event of cardiac or respiratory ARREST Do not call a "code blue"   In the event of cardiac or respiratory ARREST Do not perform Intubation, CPR, defibrillation or ACLS   In the event of cardiac or respiratory ARREST Use medication by any route, position, wound care, and other measures to relive pain and suffering. May use oxygen, suction and manual treatment of airway obstruction as needed for comfort.      02/22/22 0242           Code Status History  Date Active Date Inactive Code Status Order ID Comments User Context   01/21/2022 0603 01/27/2022 1811 DNR [id number]  Shela Leff, MD ED   11/24/2021 0007 11/26/2021 2014 DNR [id number]  Rhetta Mura, DO ED    09/27/2021 2202 09/30/2021 2305 DNR [id number]  Rhetta Mura, DO ED   06/07/2021 2050 06/14/2021 2022 DNR [id number]  Rhetta Mura, DO ED   05/02/2021 0353 05/02/2021 2111 DNR [id number]  Elwyn Reach, MD Inpatient   01/17/2021 0623 01/22/2021 2354 DNR [id number]  Reubin Milan, MD ED   01/06/2021 2258 01/09/2021 2208 DNR [id number]  Lenore Cordia, MD ED   12/02/2020 1537 12/04/2020 1641 Full Code [id number]  Karmen Bongo, MD ED   10/13/2020 2222 10/17/2020 2016 Full Code [id number]  Chotiner, Yevonne Aline, MD Inpatient   09/07/2020 2215 09/13/2020 2142 Full Code [id number]  Lenore Cordia, MD ED   03/16/2020 0811 03/20/2020 1908 Full Code [id number]  Karmen Bongo, MD ED   03/06/2020 0100 03/11/2020 1501 Full Code [id number]  Cathlyn Parsons, PA-C Inpatient   03/06/2020 0100 03/06/2020 0100 Full Code [id number]  Cathlyn Parsons, PA-C Inpatient   02/22/2020 1704 03/06/2020 0029 Full Code [id number]  Freada Bergeron, MD ED   02/05/2020 2210 02/08/2020 0218 Full Code [id number]  Barrett, Evelene Croon, PA-C ED   10/15/2019 1204 10/16/2019 1922 Full Code [id number]  Norman Herrlich Inpatient   04/20/2019 1844 04/23/2019 1723 Full Code [id number]  Donita Brooks, NP ED   02/25/2019 2021 03/05/2019 1432 Full Code [id number]  Shela Leff, MD ED   08/11/2018 1707 08/12/2018 1446 Full Code [id number]  Barb Merino, MD Inpatient   04/30/2016 0253 05/04/2016 1828 Full Code [id number]  Wylene Simmer, MD Inpatient   03/30/2016 1347 04/04/2016 1440 Full Code [id number]  Elease Hashimoto ED   03/30/2016 1347 03/30/2016 1347 Full Code [id number]  Rondel Jumbo, PA-C ED         IV Access:   Peripheral IV   Procedures and diagnostic studies:   No results found.   Medical Consultants:   None.   Subjective:    [name] no complaints today.  Objective:    Vitals:   02/25/22 1747 02/25/22 2029 02/26/22 0357 02/26/22 0752  BP: (!) 101/50 (!) 117/55 (!) 103/50 (!) 115/53   Pulse: 67 71 74 63  Resp: '16 20 20 18  '$ Temp: 97.7 F (36.5 C) 97.9 F (36.6 C) 97.8 F (36.6 C) 97.8 F (36.6 C)  TempSrc: Oral  Oral Oral  SpO2: 96% 93% 92% 95%  Weight:      Height:       SpO2: 95 %   Intake/Output Summary (Last 24 hours) at 02/26/2022 1035 Last data filed at 02/26/2022 0000 Gross per 24 hour  Intake 651.25 ml  Output 500 ml  Net 151.25 ml    Filed Weights   02/21/22 1959  Weight: 77.6 kg    Exam: General exam: In no acute distress. Respiratory system: Good air movement and clear to auscultation. Cardiovascular system: S1 & S2 heard, RRR. No JVD. Gastrointestinal system: Abdomen is nondistended, soft and nontender.  Extremities: No pedal edema. Skin: No rashes, lesions or ulcers Psychiatry: Judgement and insight appear normal. Mood & affect appropriate.  Data Reviewed:    Labs: Basic Metabolic Panel: Recent Labs  Lab 02/20/22 2308 02/21/22 2009 02/22/22 0230 02/22/22 0243 02/23/22 0243 02/25/22 0322  NA 129* 137 135 135 138 136  K 3.5 3.2* 3.0* 2.9*  3.6 3.4*  CL 94* 98 100  --  99 97*  CO2 20* 25 25  --  25 28  GLUCOSE 123* 92 96  --  99 99  BUN 50* 20 24*  --  22 22  CREATININE 3.69* 2.05* 2.32*  --  2.17* 2.03*  CALCIUM 8.2* 8.2* 7.9*  --  8.1* 7.9*  MG 1.4* 1.7 1.7  --   --   --   PHOS  --   --  2.4*  --   --  1.4*    GFR Estimated Creatinine Clearance: 31.9 mL/min (A) (by C-G formula based on SCr of 2.03 mg/dL (H)). Liver Function Tests: Recent Labs  Lab 02/20/22 2308 02/21/22 2009 02/22/22 0230 02/25/22 0322  AST 28 40 37  --   ALT '15 17 19  '$ --   ALKPHOS 103 76 62  --   BILITOT 1.4* 1.6* 1.6*  --   PROT 5.5* 5.2* 4.8*  --   ALBUMIN 3.0* 2.6* 2.4* 1.9*    No results for input(s): "LIPASE", "AMYLASE" in the last 168 hours. Recent Labs  Lab 02/22/22 0230  AMMONIA 13    Coagulation profile Recent Labs  Lab 02/21/22 2009 02/22/22 0230  INR 1.4* 1.4*    COVID-19 Labs  No results for input(s): "DDIMER",  "FERRITIN", "LDH", "CRP" in the last 72 hours.   Lab Results  Component Value Date   SARSCOV2NAA NEGATIVE 02/22/2022   SARSCOV2NAA NEGATIVE 02/21/2022   SARSCOV2NAA NEGATIVE 12/20/2021   SARSCOV2NAA POSITIVE (A) 06/03/2021    CBC: Recent Labs  Lab 02/20/22 2308 02/21/22 2009 02/22/22 0243 02/23/22 0243 02/25/22 0322  WBC 5.7 7.9  --  7.9 5.5  NEUTROABS 4.8 6.8  --   --   --   HGB 9.3* 8.4* 7.1* 7.6* 7.5*  HCT 26.4* 23.9* 21.0* 22.6* 22.1*  MCV 101.5* 100.8*  --  104.1* 101.8*  PLT 93* 83*  --  73* 60*    Cardiac Enzymes: Recent Labs  Lab 02/22/22 0230  CKTOTAL 256    BNP (last 3 results) No results for input(s): "PROBNP" in the last 8760 hours. CBG: Recent Labs  Lab 02/23/22 2345 02/25/22 0824 02/25/22 1200  GLUCAP 136* 106* 139*    D-Dimer: No results for input(s): "DDIMER" in the last 72 hours. Hgb A1c: No results for input(s): "HGBA1C" in the last 72 hours. Lipid Profile: No results for input(s): "CHOL", "HDL", "LDLCALC", "TRIG", "CHOLHDL", "LDLDIRECT" in the last 72 hours. Thyroid function studies: No results for input(s): "TSH", "T4TOTAL", "T3FREE", "THYROIDAB" in the last 72 hours.  Invalid input(s): "FREET3"  Anemia work up: No results for input(s): "VITAMINB12", "FOLATE", "FERRITIN", "TIBC", "IRON", "RETICCTPCT" in the last 72 hours.  Sepsis Labs: Recent Labs  Lab 02/20/22 2308 02/21/22 2009 02/21/22 2300 02/22/22 0230 02/23/22 0243 02/25/22 0322  PROCALCITON  --   --   --  8.80 10.32  --   WBC 5.7 7.9  --   --  7.9 5.5  LATICACIDVEN  --  2.8* 2.0* 1.4  --   --     Microbiology Recent Results (from the past 240 hour(s))  SARS Coronavirus 2 by RT PCR (hospital order, performed in [facility] lab) *cepheid single result test* Anterior Nasal Swab     Status: None   Collection Time: 02/21/22  3:16 AM   Specimen: Anterior Nasal Swab  Result Value Ref Range Status   SARS Coronavirus 2 by RT PCR NEGATIVE NEGATIVE Final     Comment: (NOTE)  SARS-CoV-2 target nucleic acids are NOT DETECTED.  The SARS-CoV-2 RNA is generally detectable in upper and lower respiratory specimens during the acute phase of infection. The lowest concentration of SARS-CoV-2 viral copies this assay can detect is 250 copies / mL. A negative result does not preclude SARS-CoV-2 infection and should not be used as the sole basis for treatment or other patient management decisions.  A negative result may occur with improper specimen collection / handling, submission of specimen other than nasopharyngeal swab, presence of viral mutation(s) within the areas targeted by this assay, and inadequate number of viral copies (<250 copies / mL). A negative result must be combined with clinical observations, patient history, and epidemiological information.  Fact Sheet for Patients:   https://www.patel.info/  Fact Sheet for Healthcare Providers: https://hall.com/  This test is not yet approved or  cleared by the [place] FDA and has been authorized for detection and/or diagnosis of SARS-CoV-2 by FDA under an Emergency Use Authorization (EUA).  This EUA will remain in effect (meaning this test can be used) for the duration of the COVID-19 declaration under Section 564(b)(1) of the Act, 21 U.S.C. section 360bbb-3(b)(1), unless the authorization is terminated or revoked sooner.  Performed at [facility], [place] [address] [place], [place] [address]   Blood culture (routine x 2)     Status: Abnormal   Collection Time: 02/21/22  4:07 AM   Specimen: BLOOD RIGHT HAND  Result Value Ref Range Status   Specimen Description BLOOD RIGHT HAND  Final   Special Requests AEROBIC BOTTLE ONLY Blood Culture adequate volume  Final   Culture  Setup Time   Final    GRAM POSITIVE COCCI IN CLUSTERS AEROBIC BOTTLE ONLY CRITICAL RESULT CALLED TO, READ BACK BY AND VERIFIED WITH: [place] ON 02/21/22 @  2022 BY DRT Performed at [facility], [place] [address], [place], [place] [address]    Culture STAPHYLOCOCCUS AUREUS (A)  Final   Report Status 02/23/2022 FINAL  Final   Organism ID, Bacteria STAPHYLOCOCCUS AUREUS  Final      Susceptibility   Staphylococcus aureus - MIC*    CIPROFLOXACIN <=0.5 SENSITIVE Sensitive     ERYTHROMYCIN >=8 RESISTANT Resistant     GENTAMICIN <=0.5 SENSITIVE Sensitive     OXACILLIN <=0.25 SENSITIVE Sensitive     TETRACYCLINE >=16 RESISTANT Resistant     VANCOMYCIN <=0.5 SENSITIVE Sensitive     TRIMETH/SULFA <=10 SENSITIVE Sensitive     CLINDAMYCIN RESISTANT Resistant     RIFAMPIN <=0.5 SENSITIVE Sensitive     Inducible Clindamycin POSITIVE Resistant     * STAPHYLOCOCCUS AUREUS  Blood Culture ID Panel (Reflexed)     Status: Abnormal   Collection Time: 02/21/22  4:07 AM  Result Value Ref Range Status   Enterococcus faecalis NOT DETECTED NOT DETECTED Final   Enterococcus Faecium NOT DETECTED NOT DETECTED Final   Listeria monocytogenes NOT DETECTED NOT DETECTED Final   Staphylococcus species DETECTED (A) NOT DETECTED Final    Comment: CRITICAL RESULT CALLED TO, READ BACK BY AND VERIFIED WITH: PHARM CATHY PEARCE ON 02/21/22 @ 2022 BY DRT    Staphylococcus aureus (BCID) DETECTED (A) NOT DETECTED Final    Comment: CRITICAL RESULT CALLED TO, READ BACK BY AND VERIFIED WITH: PHARM CATHY PEARCE ON 02/21/22 @ 2022 BY DRT    Staphylococcus epidermidis NOT DETECTED NOT DETECTED Final   Staphylococcus lugdunensis NOT DETECTED NOT DETECTED Final   Streptococcus species NOT DETECTED NOT DETECTED Final  Streptococcus agalactiae NOT DETECTED NOT DETECTED Final   Streptococcus pneumoniae NOT DETECTED NOT DETECTED Final   Streptococcus pyogenes NOT DETECTED NOT DETECTED Final   A.calcoaceticus-baumannii NOT DETECTED NOT DETECTED Final   Bacteroides fragilis NOT DETECTED NOT DETECTED Final   Enterobacterales NOT DETECTED NOT DETECTED Final   Enterobacter cloacae  complex NOT DETECTED NOT DETECTED Final   Escherichia coli NOT DETECTED NOT DETECTED Final   Klebsiella aerogenes NOT DETECTED NOT DETECTED Final   Klebsiella oxytoca NOT DETECTED NOT DETECTED Final   Klebsiella pneumoniae NOT DETECTED NOT DETECTED Final   Proteus species NOT DETECTED NOT DETECTED Final   Salmonella species NOT DETECTED NOT DETECTED Final   Serratia marcescens NOT DETECTED NOT DETECTED Final   Haemophilus influenzae NOT DETECTED NOT DETECTED Final   Neisseria meningitidis NOT DETECTED NOT DETECTED Final   Pseudomonas aeruginosa NOT DETECTED NOT DETECTED Final   Stenotrophomonas maltophilia NOT DETECTED NOT DETECTED Final   Candida albicans NOT DETECTED NOT DETECTED Final   Candida auris NOT DETECTED NOT DETECTED Final   Candida glabrata NOT DETECTED NOT DETECTED Final   Candida krusei NOT DETECTED NOT DETECTED Final   Candida parapsilosis NOT DETECTED NOT DETECTED Final   Candida tropicalis NOT DETECTED NOT DETECTED Final   Cryptococcus neoformans/gattii NOT DETECTED NOT DETECTED Final   Meth resistant mecA/C and MREJ NOT DETECTED NOT DETECTED Final    Comment: Performed at [facility], [address], [place], [place] [address]  Blood culture (routine x 2)     Status: Abnormal   Collection Time: 02/21/22  6:19 AM   Specimen: BLOOD LEFT HAND  Result Value Ref Range Status   Specimen Description BLOOD LEFT HAND  Final   Special Requests AEROBIC BOTTLE ONLY Blood Culture adequate volume  Final   Culture  Setup Time   Final    GRAM POSITIVE COCCI IN CLUSTERS AEROBIC BOTTLE ONLY CRITICAL VALUE NOTED.  VALUE IS CONSISTENT WITH PREVIOUSLY REPORTED AND CALLED VALUE.    Culture (A)  Final    STAPHYLOCOCCUS AUREUS SUSCEPTIBILITIES PERFORMED ON PREVIOUS CULTURE WITHIN THE LAST 5 DAYS. Performed at [facility], [place] [address] [place], [place] [address]    Report Status 02/23/2022 FINAL  Final  Blood Culture (routine x 2)     Status: Abnormal   Collection  Time: 02/21/22  8:42 PM   Specimen: BLOOD RIGHT FOREARM  Result Value Ref Range Status   Specimen Description BLOOD RIGHT FOREARM  Final   Special Requests   Final    BOTTLES DRAWN AEROBIC AND ANAEROBIC Blood Culture adequate volume   Culture  Setup Time   Final    GRAM POSITIVE COCCI IN CLUSTERS IN BOTH AEROBIC AND ANAEROBIC BOTTLES CRITICAL VALUE NOTED.  VALUE IS CONSISTENT WITH PREVIOUSLY REPORTED AND CALLED VALUE.    Culture (A)  Final    STAPHYLOCOCCUS AUREUS SUSCEPTIBILITIES PERFORMED ON PREVIOUS CULTURE WITHIN THE LAST 5 DAYS. Performed at [facility], [place] [address], [place], [place] [address]    Report Status 02/24/2022 FINAL  Final  Blood Culture (routine x 2)     Status: Abnormal   Collection Time: 02/21/22  8:49 PM   Specimen: BLOOD  Result Value Ref Range Status   Specimen Description BLOOD RIGHT ANTECUBITAL  Final   Special Requests   Final    BOTTLES DRAWN AEROBIC AND ANAEROBIC Blood Culture adequate volume   Culture  Setup Time   Final    GRAM POSITIVE COCCI IN CLUSTERS IN  BOTH AEROBIC AND ANAEROBIC BOTTLES CRITICAL RESULT CALLED TO, READ BACK BY AND VERIFIED WITH: PHARMD E [place]IMBOCK [id number] AT 1234 BY CM    Culture (A)  Final    STAPHYLOCOCCUS AUREUS SUSCEPTIBILITIES PERFORMED ON PREVIOUS CULTURE WITHIN THE LAST 5 DAYS. Performed at [facility], [place] [address], [place], [place] [address]    Report Status 02/24/2022 FINAL  Final  Urine Culture     Status: Abnormal   Collection Time: 02/22/22  3:28 AM   Specimen: Urine, Clean Catch  Result Value Ref Range Status   Specimen Description URINE, CLEAN CATCH  Final   Special Requests NONE  Final   Culture (A)  Final    <10,000 COLONIES/mL INSIGNIFICANT GROWTH Performed at [facility], [place] [address], [place], [place] [address]    Report Status 02/23/2022 FINAL  Final  Resp Panel by RT-PCR (Flu A&B, Covid) Anterior Nasal Swab     Status: None   Collection Time: 02/22/22  3:28 AM    Specimen: Anterior Nasal Swab  Result Value Ref Range Status   SARS Coronavirus 2 by RT PCR NEGATIVE NEGATIVE Final    Comment: (NOTE) SARS-CoV-2 target nucleic acids are NOT DETECTED.  The SARS-CoV-2 RNA is generally detectable in upper respiratory specimens during the acute phase of infection. The lowest concentration of SARS-CoV-2 viral copies this assay can detect is 138 copies/mL. A negative result does not preclude SARS-Cov-2 infection and should not be used as the sole basis for treatment or other patient management decisions. A negative result may occur with  improper specimen collection/handling, submission of specimen other than nasopharyngeal swab, presence of viral mutation(s) within the areas targeted by this assay, and inadequate number of viral copies(<138 copies/mL). A negative result must be combined with clinical observations, patient history, and epidemiological information. The expected result is Negative.  Fact Sheet for Patients:  EntrepreneurPulse.com.au  Fact Sheet for Healthcare Providers:  IncredibleEmployment.be  This test is no t yet approved or cleared by the [place] FDA and  has been authorized for detection and/or diagnosis of SARS-CoV-2 by FDA under an Emergency Use Authorization (EUA). This EUA will remain  in effect (meaning this test can be used) for the duration of the COVID-19 declaration under Section 564(b)(1) of the Act, 21 U.S.C.section 360bbb-3(b)(1), unless the authorization is terminated  or revoked sooner.       Influenza A by PCR NEGATIVE NEGATIVE Final   Influenza B by PCR NEGATIVE NEGATIVE Final    Comment: (NOTE) The Xpert Xpress SARS-CoV-2/FLU/RSV plus assay is intended as an aid in the diagnosis of influenza from Nasopharyngeal swab specimens and should not be used as a sole basis for treatment. Nasal washings and aspirates are unacceptable for Xpert Xpress  SARS-CoV-2/FLU/RSV testing.  Fact Sheet for Patients: EntrepreneurPulse.com.au  Fact Sheet for Healthcare Providers: IncredibleEmployment.be  This test is not yet approved or cleared by the [place] FDA and has been authorized for detection and/or diagnosis of SARS-CoV-2 by FDA under an Emergency Use Authorization (EUA). This EUA will remain in effect (meaning this test can be used) for the duration of the COVID-19 declaration under Section 564(b)(1) of the Act, 21 U.S.C. section 360bbb-3(b)(1), unless the authorization is terminated or revoked.  Performed at [facility], [place] [address] [place], [place] [address]   Respiratory (~20 pathogens) panel by PCR     Status: None   Collection Time: 02/22/22  3:28 AM   Specimen: Urine, Clean Catch; Respiratory  Result  Value Ref Range Status   Adenovirus NOT DETECTED NOT DETECTED Final   Coronavirus 229E NOT DETECTED NOT DETECTED Final    Comment: (NOTE) The Coronavirus on the Respiratory Panel, DOES NOT test for the novel  Coronavirus (2019 nCoV)    Coronavirus HKU1 NOT DETECTED NOT DETECTED Final   Coronavirus NL63 NOT DETECTED NOT DETECTED Final   Coronavirus OC43 NOT DETECTED NOT DETECTED Final   Metapneumovirus NOT DETECTED NOT DETECTED Final   Rhinovirus / Enterovirus NOT DETECTED NOT DETECTED Final   Influenza A NOT DETECTED NOT DETECTED Final   Influenza B NOT DETECTED NOT DETECTED Final   Parainfluenza Virus 1 NOT DETECTED NOT DETECTED Final   Parainfluenza Virus 2 NOT DETECTED NOT DETECTED Final   Parainfluenza Virus 3 NOT DETECTED NOT DETECTED Final   Parainfluenza Virus 4 NOT DETECTED NOT DETECTED Final   Respiratory Syncytial Virus NOT DETECTED NOT DETECTED Final   Bordetella pertussis NOT DETECTED NOT DETECTED Final   Bordetella Parapertussis NOT DETECTED NOT DETECTED Final   Chlamydophila pneumoniae NOT DETECTED NOT DETECTED Final   Mycoplasma pneumoniae NOT DETECTED  NOT DETECTED Final    Comment: Performed at [facility], [place] [address], [place], [place] [address]  MRSA Next Gen by PCR, Nasal     Status: None   Collection Time: 02/22/22  3:33 AM   Specimen: STOOL; Nasal Swab  Result Value Ref Range Status   MRSA by PCR Next Gen NOT DETECTED NOT DETECTED Final    Comment: (NOTE) The GeneXpert MRSA Assay (FDA approved for NASAL specimens only), is one component of a comprehensive MRSA colonization surveillance program. It is not intended to diagnose MRSA infection nor to guide or monitor treatment for MRSA infections. Test performance is not FDA approved in patients less than 57 years old. Performed at [facility], [place] [address], [place], [place] [address]   Culture, blood (Routine X 2) w Reflex to ID Panel     Status: Abnormal   Collection Time: 02/23/22  2:43 AM   Specimen: BLOOD  Result Value Ref Range Status   Specimen Description BLOOD SITE NOT SPECIFIED  Final   Special Requests   Final    BOTTLES DRAWN AEROBIC AND ANAEROBIC Blood Culture results may not be optimal due to an inadequate volume of blood received in culture bottles   Culture  Setup Time   Final    GRAM POSITIVE COCCI IN CLUSTERS AEROBIC BOTTLE ONLY CRITICAL RESULT CALLED TO, READ BACK BY AND VERIFIED WITH: PHARMD MICHAEL [place] ON 02/23/22 @ 2027 BY DRT    Culture (A)  Final    STAPHYLOCOCCUS AUREUS SUSCEPTIBILITIES PERFORMED ON PREVIOUS CULTURE WITHIN THE LAST 5 DAYS. Performed at [facility], [place] [address], [place], [place] [address]    Report Status 02/24/2022 FINAL  Final  Culture, blood (Routine X 2) w Reflex to ID Panel     Status: None (Preliminary result)   Collection Time: 02/23/22  2:43 AM   Specimen: BLOOD  Result Value Ref Range Status   Specimen Description BLOOD SITE NOT SPECIFIED  Final   Special Requests   Final    BOTTLES DRAWN AEROBIC AND ANAEROBIC Blood Culture adequate volume   Culture   Final    NO GROWTH 3 DAYS Performed  at [facility], [place] [address], [place], [place] [address]    Report Status PENDING  Incomplete  Culture, blood (Routine X 2) w Reflex to ID Panel  Status: None (Preliminary result)   Collection Time: 02/25/22  8:23 AM   Specimen: BLOOD RIGHT ARM  Result Value Ref Range Status   Specimen Description BLOOD RIGHT ARM  Final   Special Requests   Final    BOTTLES DRAWN AEROBIC AND ANAEROBIC Blood Culture adequate volume   Culture   Final    NO GROWTH < 24 HOURS Performed at [facility], [address], [place], [place] [address]    Report Status PENDING  Incomplete  Culture, blood (Routine X 2) w Reflex to ID Panel     Status: None (Preliminary result)   Collection Time: 02/25/22  8:30 AM   Specimen: BLOOD  Result Value Ref Range Status   Specimen Description BLOOD RIGHT ANTECUBITAL  Final   Special Requests   Final    BOTTLES DRAWN AEROBIC AND ANAEROBIC Blood Culture adequate volume   Culture   Final    NO GROWTH < 24 HOURS Performed at [facility], [place] [address] [place], [place] [address]    Report Status PENDING  Incomplete     Medications:    amiodarone  200 mg Oral q morning   carvedilol  3.125 mg Oral BID WC   Chlorhexidine Gluconate Cloth  6 each Topical Q0600   darbepoetin (ARANESP) injection - DIALYSIS  200 mcg Intravenous Q Thu-HD   escitalopram  10 mg Oral q morning   feeding supplement  237 mL Oral BID BM   lidocaine  3 patch Transdermal Q24H   phosphorus  250 mg Oral BID   sodium chloride flush  3 mL Intravenous Q12H   tamsulosin  0.4 mg Oral QHS   Continuous Infusions:  sodium chloride      ceFAZolin (ANCEF) IV        LOS: 4 days   Charlynne Cousins  [facility]  02/26/2022, 10:35 AM

## 2022-02-27 DIAGNOSIS — A419 Sepsis, unspecified organism: Secondary | ICD-10-CM | POA: Diagnosis not present

## 2022-02-27 DIAGNOSIS — I2510 Atherosclerotic heart disease of native coronary artery without angina pectoris: Secondary | ICD-10-CM | POA: Diagnosis not present

## 2022-02-27 DIAGNOSIS — R6520 Severe sepsis without septic shock: Secondary | ICD-10-CM | POA: Diagnosis not present

## 2022-02-27 LAB — RENAL FUNCTION PANEL
Albumin: 1.8 g/dL — ABNORMAL LOW (ref 3.5–5.0)
Anion gap: 7 (ref 5–15)
BUN: 19 mg/dL (ref 8–23)
CO2: 30 mmol/L (ref 22–32)
Calcium: 7.8 mg/dL — ABNORMAL LOW (ref 8.9–10.3)
Chloride: 102 mmol/L (ref 98–111)
Creatinine, Ser: 2.08 mg/dL — ABNORMAL HIGH (ref 0.61–1.24)
GFR, Estimated: 32 mL/min — ABNORMAL LOW (ref >60)
Glucose, Bld: 104 mg/dL — ABNORMAL HIGH (ref 70–99)
Phosphorus: 2.4 mg/dL — ABNORMAL LOW (ref 2.5–4.6)
Potassium: 3.8 mmol/L (ref 3.5–5.1)
Sodium: 139 mmol/L (ref 135–145)

## 2022-02-27 LAB — GLUCOSE, CAPILLARY
Glucose-Capillary: 123 mg/dL — ABNORMAL HIGH (ref 70–99)
Glucose-Capillary: 115 mg/dL — ABNORMAL HIGH (ref 70–99)

## 2022-02-27 MED ORDER — ENOXAPARIN SODIUM 40 MG/0.4ML IJ SOSY
40.0000 mg | PREFILLED_SYRINGE | Freq: Every day | INTRAMUSCULAR | Status: DC
  Administered 2022-02-27 – 2022-02-28 (×2): 40 mg via SUBCUTANEOUS
  Filled 2022-02-27 (×2): qty 0.4

## 2022-02-27 NOTE — Progress Notes (Signed)
[facility] PROGRESS NOTE    Progress Note  JAMAIR CATO  XBM:[id number] DOB: 22-May-1940 DOA: 02/21/2022 PCP: [name], MD     Brief Narrative:   AIYDEN LAUDERBACK is an 81 y.o. 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 Blood cultures repeated on 02/25/2022 are negative till date. ID was consulted now has been transitioned to IV cefazolin.   Has remained afebrile. MRI of the C-spine showed discitis and osteomyelitis with possible abscess C6-C7 TEE cannot be performed due to his C1 articular facet fracture.  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  Hypokalemia: Nephrology has been consulted, further management per hemodialysis.  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 Hemoglobin has remained relatively stable at 7.5-7.6.  C1 articular facet fracture: Currently in a c-collar. Cardiology will not proceed with TEE  Elevated troponins: In the setting of sepsis and end-stage renal disease, likely demand ischemia he denies any chest pain twelve-lead EKG showed no signs  of ischemia.  Mild aortic stenosis: Mean gradient of 12 continue to monitor as an outpatient.  Mild hyponatremia: Further management per renal with dialysis. Now resolved.  Diarrhea: Has had no further diarrhea in house.  Paroxysmal atrial fibrillation: On anticoagulation rate control continue amiodarone.  Essential hypertension: Likely due to sepsis permissive hypertension was allowed his blood pressure has remained relatively stable. Continue Coreg.  Hyperlipidemia: Continue statins.  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  Prolonged QTc: Noted continue to monitor on telemetry.    DVT prophylaxis: lovenox Family Communication:none Status is: Inpatient Remains inpatient appropriate because: Likely back to SNF after infectious work-up and treatment has been completed.    Code Status:     Code Status Orders  (From admission, onward)           Start     Ordered   02/22/22 0243  Do not attempt resuscitation (DNR)  Continuous       Question Answer Comment  In the event of cardiac or respiratory ARREST Do not call a "code blue"   In the event of cardiac or respiratory ARREST Do not perform Intubation, CPR, defibrillation or ACLS   In the event of cardiac or respiratory ARREST Use medication by any route, position, wound care, and other measures to relive pain and suffering. May use oxygen, suction and manual treatment of airway obstruction as needed for comfort.      02/22/22 0242           Code Status History     Date Active Date Inactive Code Status Order ID Comments User Context   01/21/2022 0603 01/27/2022 1811 DNR [id number]  Marlowe Sax,  Wandra Feinstein, MD ED   11/24/2021 0007 11/26/2021 2014 DNR [id number]  Rhetta Mura, DO ED   09/27/2021 2202 09/30/2021 2305 DNR [id number]  Rhetta Mura, DO ED   06/07/2021 2050 06/14/2021 2022 DNR [id number]  Rhetta Mura, DO ED   05/02/2021 0353  05/02/2021 2111 DNR [id number]  Elwyn Reach, MD Inpatient   01/17/2021 0623 01/22/2021 2354 DNR [id number]  Reubin Milan, MD ED   01/06/2021 2258 01/09/2021 2208 DNR [id number]  Lenore Cordia, MD ED   12/02/2020 1537 12/04/2020 1641 Full Code [id number]  Karmen Bongo, MD ED   10/13/2020 2222 10/17/2020 2016 Full Code [id number]  Chotiner, Yevonne Aline, MD Inpatient   09/07/2020 2215 09/13/2020 2142 Full Code [id number]  Lenore Cordia, MD ED   03/16/2020 0811 03/20/2020 1908 Full Code [id number]  Karmen Bongo, MD ED   03/06/2020 0100 03/11/2020 1501 Full Code [id number]  Cathlyn Parsons, PA-C Inpatient   03/06/2020 0100 03/06/2020 0100 Full Code [id number]  Cathlyn Parsons, PA-C Inpatient   02/22/2020 1704 03/06/2020 0029 Full Code [id number]  Freada Bergeron, MD ED   02/05/2020 2210 02/08/2020 0218 Full Code [id number]  Barrett, Evelene Croon, PA-C ED   10/15/2019 1204 10/16/2019 1922 Full Code [id number]  Norman Herrlich Inpatient   04/20/2019 1844 04/23/2019 1723 Full Code [id number]  Donita Brooks, NP ED   02/25/2019 2021 03/05/2019 1432 Full Code [id number]  Shela Leff, MD ED   08/11/2018 1707 08/12/2018 1446 Full Code [id number]  Barb Merino, MD Inpatient   04/30/2016 0253 05/04/2016 1828 Full Code [id number]  Wylene Simmer, MD Inpatient   03/30/2016 1347 04/04/2016 1440 Full Code [id number]  Rondel Jumbo PA-C ED   03/30/2016 1347 03/30/2016 1347 Full Code [id number]  Rondel Jumbo, PA-C ED         IV Access:   Peripheral IV   Procedures and diagnostic studies:   No results found.   Medical Consultants:   None.   Subjective:    [name] no complaints today.  Objective:    Vitals:   02/26/22 2112 02/26/22 2134 02/27/22 0414 02/27/22 0918  BP: (!) 144/60 (!) 149/61 (!) 116/52 124/63  Pulse: 73 74 70 73  Resp: (!) '25 20 18 17  '$ Temp: 98.1 F (36.7 C) 98 F (36.7 C) 98.2 F (36.8 C) 98.9 F (37.2 C)  TempSrc:  Oral Oral Oral  SpO2: 100% 96% 95%    Weight:      Height:       SpO2: 95 %   Intake/Output Summary (Last 24 hours) at 02/27/2022 1102 Last data filed at 02/26/2022 2300 Gross per 24 hour  Intake 563 ml  Output 1150 ml  Net -587 ml    Filed Weights   02/21/22 1959  Weight: 77.6 kg    Exam: General exam: In no acute distress. Respiratory system: Good air movement and clear to auscultation. Cardiovascular system: S1 & S2 heard, RRR. No JVD. Gastrointestinal system: Abdomen is nondistended, soft and nontender.  Extremities: No pedal edema. Skin: No rashes, lesions or ulcers Psychiatry: Judgement and insight appear normal. Mood & affect appropriate.  Data Reviewed:    Labs: Basic Metabolic Panel: Recent Labs  Lab 02/20/22 2308 02/21/22 2009 02/22/22 0230 02/22/22 0243 02/23/22 0243 02/25/22 0322 02/26/22 1011 02/27/22 0400  NA 129* 137 135 135 138 136 135 139  K 3.5 3.2* 3.0* 2.9* 3.6 3.4* 3.3* 3.8  CL 94* 98 100  --  99 97* 99  102  CO2 20* 25 25  --  '25 28 28 30  '$ GLUCOSE 123* 92 96  --  99 99 121* 104*  BUN 50* 20 24*  --  22 22 38* 19  CREATININE 3.69* 2.05* 2.32*  --  2.17* 2.03* 3.03* 2.08*  CALCIUM 8.2* 8.2* 7.9*  --  8.1* 7.9* 7.6* 7.8*  MG 1.4* 1.7 1.7  --   --   --  1.7  --   PHOS  --   --  2.4*  --   --  1.4* 2.6 2.4*    GFR Estimated Creatinine Clearance: 31.1 mL/min (A) (by C-G formula based on SCr of 2.08 mg/dL (H)). Liver Function Tests: Recent Labs  Lab 02/20/22 2308 02/21/22 2009 02/22/22 0230 02/25/22 0322 02/26/22 1011 02/27/22 0400  AST 28 40 37  --   --   --   ALT '15 17 19  '$ --   --   --   ALKPHOS 103 76 62  --   --   --   BILITOT 1.4* 1.6* 1.6*  --   --   --   PROT 5.5* 5.2* 4.8*  --   --   --   ALBUMIN 3.0* 2.6* 2.4* 1.9* 1.9* 1.8*    No results for input(s): "LIPASE", "AMYLASE" in the last 168 hours. Recent Labs  Lab 02/22/22 0230  AMMONIA 13    Coagulation profile Recent Labs  Lab 02/21/22 2009 02/22/22 0230  INR 1.4* 1.4*    COVID-19 Labs  No  results for input(s): "DDIMER", "FERRITIN", "LDH", "CRP" in the last 72 hours.   Lab Results  Component Value Date   SARSCOV2NAA NEGATIVE 02/22/2022   SARSCOV2NAA NEGATIVE 02/21/2022   SARSCOV2NAA NEGATIVE 12/20/2021   SARSCOV2NAA POSITIVE (A) 06/03/2021    CBC: Recent Labs  Lab 02/20/22 2308 02/21/22 2009 02/22/22 0243 02/23/22 0243 02/25/22 0322  WBC 5.7 7.9  --  7.9 5.5  NEUTROABS 4.8 6.8  --   --   --   HGB 9.3* 8.4* 7.1* 7.6* 7.5*  HCT 26.4* 23.9* 21.0* 22.6* 22.1*  MCV 101.5* 100.8*  --  104.1* 101.8*  PLT 93* 83*  --  73* 60*    Cardiac Enzymes: Recent Labs  Lab 02/22/22 0230  CKTOTAL 256    BNP (last 3 results) No results for input(s): "PROBNP" in the last 8760 hours. CBG: Recent Labs  Lab 02/23/22 2345 02/25/22 0824 02/25/22 1200  GLUCAP 136* 106* 139*    D-Dimer: No results for input(s): "DDIMER" in the last 72 hours. Hgb A1c: No results for input(s): "HGBA1C" in the last 72 hours. Lipid Profile: No results for input(s): "CHOL", "HDL", "LDLCALC", "TRIG", "CHOLHDL", "LDLDIRECT" in the last 72 hours. Thyroid function studies: No results for input(s): "TSH", "T4TOTAL", "T3FREE", "THYROIDAB" in the last 72 hours.  Invalid input(s): "FREET3"  Anemia work up: No results for input(s): "VITAMINB12", "FOLATE", "FERRITIN", "TIBC", "IRON", "RETICCTPCT" in the last 72 hours.  Sepsis Labs: Recent Labs  Lab 02/20/22 2308 02/21/22 2009 02/21/22 2300 02/22/22 0230 02/23/22 0243 02/25/22 0322  PROCALCITON  --   --   --  8.80 10.32  --   WBC 5.7 7.9  --   --  7.9 5.5  LATICACIDVEN  --  2.8* 2.0* 1.4  --   --     Microbiology Recent Results (from the past 240 hour(s))  SARS Coronavirus 2 by RT PCR (hospital order, performed in [place] hospital lab) *cepheid single result test* Anterior Nasal Swab  Status: None   Collection Time: 02/21/22  3:16 AM   Specimen: Anterior Nasal Swab  Result Value Ref Range Status   SARS Coronavirus 2 by RT PCR  NEGATIVE NEGATIVE Final    Comment: (NOTE) SARS-CoV-2 target nucleic acids are NOT DETECTED.  The SARS-CoV-2 RNA is generally detectable in upper and lower respiratory specimens during the acute phase of infection. The lowest concentration of SARS-CoV-2 viral copies this assay can detect is 250 copies / mL. A negative result does not preclude SARS-CoV-2 infection and should not be used as the sole basis for treatment or other patient management decisions.  A negative result may occur with improper specimen collection / handling, submission of specimen other than nasopharyngeal swab, presence of viral mutation(s) within the areas targeted by this assay, and inadequate number of viral copies (<250 copies / mL). A negative result must be combined with clinical observations, patient history, and epidemiological information.  Fact Sheet for Patients:   https://www.patel.info/  Fact Sheet for Healthcare Providers: https://hall.com/  This test is not yet approved or  cleared by the [place] FDA and has been authorized for detection and/or diagnosis of SARS-CoV-2 by FDA under an Emergency Use Authorization (EUA).  This EUA will remain in effect (meaning this test can be used) for the duration of the COVID-19 declaration under Section 564(b)(1) of the Act, 21 U.S.C. section 360bbb-3(b)(1), unless the authorization is terminated or revoked sooner.  Performed at [facility], [place] [address] [place], [place] [address]   Blood culture (routine x 2)     Status: Abnormal   Collection Time: 02/21/22  4:07 AM   Specimen: BLOOD RIGHT HAND  Result Value Ref Range Status   Specimen Description BLOOD RIGHT HAND  Final   Special Requests AEROBIC BOTTLE ONLY Blood Culture adequate volume  Final   Culture  Setup Time   Final    GRAM POSITIVE COCCI IN CLUSTERS AEROBIC BOTTLE ONLY CRITICAL RESULT CALLED TO, READ BACK BY AND VERIFIED WITH: [place] ON 02/21/22 @ 2022 BY DRT Performed at [facility], [place] [address], [place],  [address]    Culture STAPHYLOCOCCUS AUREUS (A)  Final   Report Status 02/23/2022 FINAL  Final   Organism ID, Bacteria STAPHYLOCOCCUS AUREUS  Final      Susceptibility   Staphylococcus aureus - MIC*    CIPROFLOXACIN <=0.5 SENSITIVE Sensitive     ERYTHROMYCIN >=8 RESISTANT Resistant     GENTAMICIN <=0.5 SENSITIVE Sensitive     OXACILLIN <=0.25 SENSITIVE Sensitive     TETRACYCLINE >=16 RESISTANT Resistant     VANCOMYCIN <=0.5 SENSITIVE Sensitive     TRIMETH/SULFA <=10 SENSITIVE Sensitive     CLINDAMYCIN RESISTANT Resistant     RIFAMPIN <=0.5 SENSITIVE Sensitive     Inducible Clindamycin POSITIVE Resistant     * STAPHYLOCOCCUS AUREUS  Blood Culture ID Panel (Reflexed)     Status: Abnormal   Collection Time: 02/21/22  4:07 AM  Result Value Ref Range Status   Enterococcus faecalis NOT DETECTED NOT DETECTED Final   Enterococcus Faecium NOT DETECTED NOT DETECTED Final   Listeria monocytogenes NOT DETECTED NOT DETECTED Final   Staphylococcus species DETECTED (A) NOT DETECTED Final    Comment: CRITICAL RESULT CALLED TO, READ BACK BY AND VERIFIED WITH: PHARM CATHY PEARCE ON 02/21/22 @ 2022 BY DRT    Staphylococcus aureus (BCID) DETECTED (A) NOT DETECTED Final    Comment: CRITICAL RESULT CALLED TO, READ BACK BY AND VERIFIED WITH:  [place] ON 02/21/22 @ 2022 BY DRT    Staphylococcus epidermidis NOT DETECTED NOT DETECTED Final   Staphylococcus lugdunensis NOT DETECTED NOT DETECTED Final   Streptococcus species NOT DETECTED NOT DETECTED Final   Streptococcus agalactiae NOT DETECTED NOT DETECTED Final   Streptococcus pneumoniae NOT DETECTED NOT DETECTED Final   Streptococcus pyogenes NOT DETECTED NOT DETECTED Final   A.calcoaceticus-baumannii NOT DETECTED NOT DETECTED Final   Bacteroides fragilis NOT DETECTED NOT DETECTED Final   Enterobacterales NOT DETECTED NOT DETECTED Final    Enterobacter cloacae complex NOT DETECTED NOT DETECTED Final   Escherichia coli NOT DETECTED NOT DETECTED Final   Klebsiella aerogenes NOT DETECTED NOT DETECTED Final   Klebsiella oxytoca NOT DETECTED NOT DETECTED Final   Klebsiella pneumoniae NOT DETECTED NOT DETECTED Final   Proteus species NOT DETECTED NOT DETECTED Final   Salmonella species NOT DETECTED NOT DETECTED Final   Serratia marcescens NOT DETECTED NOT DETECTED Final   Haemophilus influenzae NOT DETECTED NOT DETECTED Final   Neisseria meningitidis NOT DETECTED NOT DETECTED Final   Pseudomonas aeruginosa NOT DETECTED NOT DETECTED Final   Stenotrophomonas maltophilia NOT DETECTED NOT DETECTED Final   Candida albicans NOT DETECTED NOT DETECTED Final   Candida auris NOT DETECTED NOT DETECTED Final   Candida glabrata NOT DETECTED NOT DETECTED Final   Candida krusei NOT DETECTED NOT DETECTED Final   Candida parapsilosis NOT DETECTED NOT DETECTED Final   Candida tropicalis NOT DETECTED NOT DETECTED Final   Cryptococcus neoformans/gattii NOT DETECTED NOT DETECTED Final   Meth resistant mecA/C and MREJ NOT DETECTED NOT DETECTED Final    Comment: Performed at [facility], [address], [place], [place] [address]  Blood culture (routine x 2)     Status: Abnormal   Collection Time: 02/21/22  6:19 AM   Specimen: BLOOD LEFT HAND  Result Value Ref Range Status   Specimen Description BLOOD LEFT HAND  Final   Special Requests AEROBIC BOTTLE ONLY Blood Culture adequate volume  Final   Culture  Setup Time   Final    GRAM POSITIVE COCCI IN CLUSTERS AEROBIC BOTTLE ONLY CRITICAL VALUE NOTED.  VALUE IS CONSISTENT WITH PREVIOUSLY REPORTED AND CALLED VALUE.    Culture (A)  Final    STAPHYLOCOCCUS AUREUS SUSCEPTIBILITIES PERFORMED ON PREVIOUS CULTURE WITHIN THE LAST 5 DAYS. Performed at [facility], [place] [address], [place], [place] [address]    Report Status 02/23/2022 FINAL  Final  Blood Culture (routine x 2)     Status:  Abnormal   Collection Time: 02/21/22  8:42 PM   Specimen: BLOOD RIGHT FOREARM  Result Value Ref Range Status   Specimen Description BLOOD RIGHT FOREARM  Final   Special Requests   Final    BOTTLES DRAWN AEROBIC AND ANAEROBIC Blood Culture adequate volume   Culture  Setup Time   Final    GRAM POSITIVE COCCI IN CLUSTERS IN BOTH AEROBIC AND ANAEROBIC BOTTLES CRITICAL VALUE NOTED.  VALUE IS CONSISTENT WITH PREVIOUSLY REPORTED AND CALLED VALUE.    Culture (A)  Final    STAPHYLOCOCCUS AUREUS SUSCEPTIBILITIES PERFORMED ON PREVIOUS CULTURE WITHIN THE LAST 5 DAYS. Performed at [facility], [place] [address] [place], [place] [address]    Report Status 02/24/2022 FINAL  Final  Blood Culture (routine x 2)     Status: Abnormal   Collection Time: 02/21/22  8:49 PM   Specimen: BLOOD  Result Value Ref Range Status   Specimen Description BLOOD RIGHT ANTECUBITAL  Final   Special Requests   Final    BOTTLES DRAWN AEROBIC AND ANAEROBIC Blood Culture adequate volume   Culture  Setup Time   Final    GRAM POSITIVE COCCI IN CLUSTERS IN BOTH AEROBIC AND ANAEROBIC BOTTLES CRITICAL RESULT CALLED TO, READ BACK BY AND VERIFIED WITH: PHARMD E STEIMBOCK [id number] AT 1234 BY CM    Culture (A)  Final    STAPHYLOCOCCUS AUREUS SUSCEPTIBILITIES PERFORMED ON PREVIOUS CULTURE WITHIN THE LAST 5 DAYS. Performed at [facility], [place] [address], [place], [place] [address]    Report Status 02/24/2022 FINAL  Final  Urine Culture     Status: Abnormal   Collection Time: 02/22/22  3:28 AM   Specimen: Urine, Clean Catch  Result Value Ref Range Status   Specimen Description URINE, CLEAN CATCH  Final   Special Requests NONE  Final   Culture (A)  Final    <10,000 COLONIES/mL INSIGNIFICANT GROWTH Performed at [facility], [place] [address] [place], [place] [address]    Report Status 02/23/2022 FINAL  Final  Resp Panel by RT-PCR (Flu A&B, Covid) Anterior Nasal Swab     Status: None   Collection Time:  02/22/22  3:28 AM   Specimen: Anterior Nasal Swab  Result Value Ref Range Status   SARS Coronavirus 2 by RT PCR NEGATIVE NEGATIVE Final    Comment: (NOTE) SARS-CoV-2 target nucleic acids are NOT DETECTED.  The SARS-CoV-2 RNA is generally detectable in upper respiratory specimens during the acute phase of infection. The lowest concentration of SARS-CoV-2 viral copies this assay can detect is 138 copies/mL. A negative result does not preclude SARS-Cov-2 infection and should not be used as the sole basis for treatment or other patient management decisions. A negative result may occur with  improper specimen collection/handling, submission of specimen other than nasopharyngeal swab, presence of viral mutation(s) within the areas targeted by this assay, and inadequate number of viral copies(<138 copies/mL). A negative result must be combined with clinical observations, patient history, and epidemiological information. The expected result is Negative.  Fact Sheet for Patients:  EntrepreneurPulse.com.au  Fact Sheet for Healthcare Providers:  IncredibleEmployment.be  This test is no t yet approved or cleared by the [place] FDA and  has been authorized for detection and/or diagnosis of SARS-CoV-2 by FDA under an Emergency Use Authorization (EUA). This EUA will remain  in effect (meaning this test can be used) for the duration of the COVID-19 declaration under Section 564(b)(1) of the Act, 21 U.S.C.section 360bbb-3(b)(1), unless the authorization is terminated  or revoked sooner.       Influenza A by PCR NEGATIVE NEGATIVE Final   Influenza B by PCR NEGATIVE NEGATIVE Final    Comment: (NOTE) The Xpert Xpress SARS-CoV-2/FLU/RSV plus assay is intended as an aid in the diagnosis of influenza from Nasopharyngeal swab specimens and should not be used as a sole basis for treatment. Nasal washings and aspirates are unacceptable for Xpert Xpress  SARS-CoV-2/FLU/RSV testing.  Fact Sheet for Patients: EntrepreneurPulse.com.au  Fact Sheet for Healthcare Providers: IncredibleEmployment.be  This test is not yet approved or cleared by the [place] FDA and has been authorized for detection and/or diagnosis of SARS-CoV-2 by FDA under an Emergency Use Authorization (EUA). This EUA will remain in effect (meaning this test can be used) for the duration of the COVID-19 declaration under Section 564(b)(1) of the Act, 21 U.S.C. section 360bbb-3(b)(1), unless the authorization is terminated or revoked.  Performed at [facility],  [address], [place], [place] [address]   Respiratory (~20 pathogens) panel by PCR     Status: None   Collection Time: 02/22/22  3:28 AM   Specimen: Urine, Clean Catch; Respiratory  Result Value Ref Range Status   Adenovirus NOT DETECTED NOT DETECTED Final   Coronavirus 229E NOT DETECTED NOT DETECTED Final    Comment: (NOTE) The Coronavirus on the Respiratory Panel, DOES NOT test for the novel  Coronavirus (2019 nCoV)    Coronavirus HKU1 NOT DETECTED NOT DETECTED Final   Coronavirus NL63 NOT DETECTED NOT DETECTED Final   Coronavirus OC43 NOT DETECTED NOT DETECTED Final   Metapneumovirus NOT DETECTED NOT DETECTED Final   Rhinovirus / Enterovirus NOT DETECTED NOT DETECTED Final   Influenza A NOT DETECTED NOT DETECTED Final   Influenza B NOT DETECTED NOT DETECTED Final   Parainfluenza Virus 1 NOT DETECTED NOT DETECTED Final   Parainfluenza Virus 2 NOT DETECTED NOT DETECTED Final   Parainfluenza Virus 3 NOT DETECTED NOT DETECTED Final   Parainfluenza Virus 4 NOT DETECTED NOT DETECTED Final   Respiratory Syncytial Virus NOT DETECTED NOT DETECTED Final   Bordetella pertussis NOT DETECTED NOT DETECTED Final   Bordetella Parapertussis NOT DETECTED NOT DETECTED Final   Chlamydophila pneumoniae NOT DETECTED NOT DETECTED Final   Mycoplasma pneumoniae NOT DETECTED  NOT DETECTED Final    Comment: Performed at [facility], [place] [address], [place], [place] [address]  MRSA Next Gen by PCR, Nasal     Status: None   Collection Time: 02/22/22  3:33 AM   Specimen: STOOL; Nasal Swab  Result Value Ref Range Status   MRSA by PCR Next Gen NOT DETECTED NOT DETECTED Final    Comment: (NOTE) The GeneXpert MRSA Assay (FDA approved for NASAL specimens only), is one component of a comprehensive MRSA colonization surveillance program. It is not intended to diagnose MRSA infection nor to guide or monitor treatment for MRSA infections. Test performance is not FDA approved in patients less than 43 years old. Performed at [facility], [place] [address], [place], [place] [address]   Culture, blood (Routine X 2) w Reflex to ID Panel     Status: Abnormal   Collection Time: 02/23/22  2:43 AM   Specimen: BLOOD  Result Value Ref Range Status   Specimen Description BLOOD SITE NOT SPECIFIED  Final   Special Requests   Final    BOTTLES DRAWN AEROBIC AND ANAEROBIC Blood Culture results may not be optimal due to an inadequate volume of blood received in culture bottles   Culture  Setup Time   Final    GRAM POSITIVE COCCI IN CLUSTERS AEROBIC BOTTLE ONLY CRITICAL RESULT CALLED TO, READ BACK BY AND VERIFIED WITH: PHARMD MICHAEL [place] ON 02/23/22 @ 2027 BY DRT    Culture (A)  Final    STAPHYLOCOCCUS AUREUS SUSCEPTIBILITIES PERFORMED ON PREVIOUS CULTURE WITHIN THE LAST 5 DAYS. Performed at [facility], [place] [address], [place], [place] [address]    Report Status 02/24/2022 FINAL  Final  Culture, blood (Routine X 2) w Reflex to ID Panel     Status: None (Preliminary result)   Collection Time: 02/23/22  2:43 AM   Specimen: BLOOD  Result Value Ref Range Status   Specimen Description BLOOD SITE NOT SPECIFIED  Final   Special Requests   Final    BOTTLES DRAWN AEROBIC AND ANAEROBIC Blood Culture adequate volume   Culture   Final    NO GROWTH  4 DAYS Performed  at [facility], [place] [address] [place], [place] [address]    Report Status PENDING  Incomplete  Culture, blood (Routine X 2) w Reflex to ID Panel     Status: None (Preliminary result)   Collection Time: 02/25/22  8:23 AM   Specimen: BLOOD RIGHT ARM  Result Value Ref Range Status   Specimen Description BLOOD RIGHT ARM  Final   Special Requests   Final    BOTTLES DRAWN AEROBIC AND ANAEROBIC Blood Culture adequate volume   Culture   Final    NO GROWTH 2 DAYS Performed at [facility], [place] [address] [place], [place] [address]    Report Status PENDING  Incomplete  Culture, blood (Routine X 2) w Reflex to ID Panel     Status: None (Preliminary result)   Collection Time: 02/25/22  8:30 AM   Specimen: BLOOD  Result Value Ref Range Status   Specimen Description BLOOD RIGHT ANTECUBITAL  Final   Special Requests   Final    BOTTLES DRAWN AEROBIC AND ANAEROBIC Blood Culture adequate volume   Culture   Final    NO GROWTH 2 DAYS Performed at [facility], [place] [address] [place], [place] [address]    Report Status PENDING  Incomplete     Medications:    amiodarone  200 mg Oral q morning   carvedilol  3.125 mg Oral BID WC   Chlorhexidine Gluconate Cloth  6 each Topical Q0600   darbepoetin (ARANESP) injection - DIALYSIS  200 mcg Intravenous Q Thu-HD   enoxaparin (LOVENOX) injection  40 mg Subcutaneous Daily   escitalopram  10 mg Oral q morning   feeding supplement  237 mL Oral BID BM   lidocaine  3 patch Transdermal Q24H   sodium chloride flush  3 mL Intravenous Q12H   tamsulosin  0.4 mg Oral QHS   Continuous Infusions:  sodium chloride      ceFAZolin (ANCEF) IV 2 g (02/26/22 2240)      LOS: 5 days   Charlynne Cousins  [facility]  02/27/2022, 11:02 AM

## 2022-02-27 NOTE — Progress Notes (Signed)
[facility] Progress Note   Subjective:   HD had to be cut short a little last night mostly due to pts not cooperating-  removed 800 ccs- still in mittens this AM-  fidgety- confused   Objective Vitals:   02/26/22 2055 02/26/22 2112 02/26/22 2134 02/27/22 0414  BP: 139/63 (!) 144/60 (!) 149/61 (!) 116/52  Pulse: 69 73 74 70  Resp: (!) 28 (!) 25 20 18   Temp: 98.1 F (36.7 C) 98.1 F (36.7 C) 98 F (36.7 C) 98.2 F (36.8 C)  TempSrc: Oral  Oral Oral  SpO2: 100% 100% 96% 95%  Weight:      Height:       Physical Exam General: chronically ill appearing male in neck collar-  mittens in place-  fidgety , confused  Heart:RRR. No murmurs, rubs or gallops Lungs: CTA bilaterally. No wheeze, rales or rhonchi. Breathing is unlabored.  Abdomen:Soft, mild tenderness to LLQ with palpation, no guarding or rebound tenderness Extremities: No edema, ischemic changes, or open wounds Dialysis Access: LUE AVF   Filed Weights   02/21/22 1959  Weight: 77.6 kg    Intake/Output Summary (Last 24 hours) at 02/27/2022 0728 Last data filed at 02/26/2022 2300 Gross per 24 hour  Intake 683 ml  Output 1150 ml  Net -467 ml     Additional Objective Labs: Basic Metabolic Panel: Recent Labs  Lab 02/25/22 0322 02/26/22 1011 02/27/22 0400  NA 136 135 139  K 3.4* 3.3* 3.8  CL 97* 99 102  CO2 28 28 30   GLUCOSE 99 121* 104*  BUN 22 38* 19  CREATININE 2.03* 3.03* 2.08*  CALCIUM 7.9* 7.6* 7.8*  PHOS 1.4* 2.6 2.4*   Liver Function Tests: Recent Labs  Lab 02/20/22 2308 02/21/22 2009 02/22/22 0230 02/25/22 0322 02/26/22 1011 02/27/22 0400  AST 28 40 37  --   --   --   ALT 15 17 19   --   --   --   ALKPHOS 103 76 62  --   --   --   BILITOT 1.4* 1.6* 1.6*  --   --   --   PROT 5.5* 5.2* 4.8*  --   --   --   ALBUMIN 3.0* 2.6* 2.4* 1.9* 1.9* 1.8*   CBC: Recent Labs  Lab 02/20/22 2308 02/21/22 2009 02/22/22 0243 02/23/22 0243 02/25/22 0322  WBC 5.7 7.9  --  7.9 5.5   NEUTROABS 4.8 6.8  --   --   --   HGB 9.3* 8.4* 7.1* 7.6* 7.5*  HCT 26.4* 23.9* 21.0* 22.6* 22.1*  MCV 101.5* 100.8*  --  104.1* 101.8*  PLT 93* 83*  --  73* 60*   Blood Culture    Component Value Date/Time   SDES BLOOD RIGHT ANTECUBITAL 02/25/2022 0830   SPECREQUEST  02/25/2022 0830    BOTTLES DRAWN AEROBIC AND ANAEROBIC Blood Culture adequate volume   CULT  02/25/2022 0830    NO GROWTH < 24 HOURS Performed at [facility], [address], [place], [place] [address]    REPTSTATUS PENDING 02/25/2022 0830   Cardiac Enzymes: Recent Labs  Lab 02/22/22 0230  CKTOTAL 256   Iron Studies:  No results for input(s): "IRON", "TIBC", "TRANSFERRIN", "FERRITIN" in the last 72 hours.  Lab Results  Component Value Date   INR 1.4 (H) 02/22/2022   INR 1.4 (H) 02/21/2022   INR 1.1 11/23/2021   Studies/Results: No results found.  Medications:  sodium chloride  ceFAZolin (ANCEF) IV 2 g (02/26/22 2240)    amiodarone  200 mg Oral q morning   carvedilol  3.125 mg Oral BID WC   Chlorhexidine Gluconate Cloth  6 each Topical Q0600   darbepoetin (ARANESP) injection - DIALYSIS  200 mcg Intravenous Q Thu-HD   escitalopram  10 mg Oral q morning   feeding supplement  237 mL Oral BID BM   lidocaine  3 patch Transdermal Q24H   sodium chloride flush  3 mL Intravenous Q12H   tamsulosin  0.4 mg Oral QHS    Dialysis Orders: TTS - NW 4hrs, BFR 400/AF 1.5,  EDW 79.8 kg, 3.0 K/ 2.5 Ca   Access: LU AVF  No Heparin  Mircera 150 mcg q2wks - last 9/27  Assessment/Plan: Sepsis due to gram-positive bacteremia in setting of left foot cellulitis and left foot osteomyelitis  -SIRS criteria met with tachycardia, fever, RR >20  -CT with likely PNA.  Started on broad spectrum ABX. ID consulted and ABX narrowed to IV cefazolin.  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 No epidural abscess.  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 Sharol Given with orthopedics. No surgical intervention indicated at this time - Does not think foot is cause of sepsis  Elevated troponin   -No ischemic evaluation needed at this time.     4. Hypokalemia High k bath with HD   7. ESRD  -On HD TTS-  Thursday and Saturday there were issues due to pt dementia-  hope that this is due to acute illness -  if does not clear he cannot be dialyzed in the OP setting like this-  would not be safe  -AVF does not appear to be source of bacteremia Plan for next HD Tuesday                8. Hypertension/volume   - Allow permissive HTN per primary team - Coreg held on admission - Does not appear fluid overloaded.  now under his EDW-  min UF with HD-     9. Anemia of CKD  - Hgb 7.6 - ESA  increased dose to 270mg qwk - tsat 10%, ferritin 1700, no IV iron.  - Transfuse if Hbg <7.0   10. Secondary Hyperparathyroidism  - Ca 7.9, CCa 9.2, Phos 2.4-  last phos actually 1.4-  needed repletion  - Not on VDRA or binders.    11. Dispo-  consider another palliative intervention -  pt clearly failing to thrive   [facility] 02/27/2022,7:28 AM  LOS: 5 days

## 2022-02-28 DIAGNOSIS — M4642 Discitis, unspecified, cervical region: Secondary | ICD-10-CM | POA: Diagnosis not present

## 2022-02-28 DIAGNOSIS — M4622 Osteomyelitis of vertebra, cervical region: Secondary | ICD-10-CM

## 2022-02-28 DIAGNOSIS — R6520 Severe sepsis without septic shock: Secondary | ICD-10-CM | POA: Diagnosis not present

## 2022-02-28 DIAGNOSIS — I2510 Atherosclerotic heart disease of native coronary artery without angina pectoris: Secondary | ICD-10-CM | POA: Diagnosis not present

## 2022-02-28 DIAGNOSIS — R7881 Bacteremia: Secondary | ICD-10-CM | POA: Diagnosis not present

## 2022-02-28 DIAGNOSIS — B9561 Methicillin susceptible Staphylococcus aureus infection as the cause of diseases classified elsewhere: Secondary | ICD-10-CM | POA: Diagnosis not present

## 2022-02-28 DIAGNOSIS — A419 Sepsis, unspecified organism: Secondary | ICD-10-CM | POA: Diagnosis not present

## 2022-02-28 LAB — RENAL FUNCTION PANEL
Albumin: 1.8 g/dL — ABNORMAL LOW (ref 3.5–5.0)
Anion gap: 8 (ref 5–15)
BUN: 31 mg/dL — ABNORMAL HIGH (ref 8–23)
CO2: 28 mmol/L (ref 22–32)
Calcium: 7.9 mg/dL — ABNORMAL LOW (ref 8.9–10.3)
Chloride: 98 mmol/L (ref 98–111)
Creatinine, Ser: 2.8 mg/dL — ABNORMAL HIGH (ref 0.61–1.24)
GFR, Estimated: 22 mL/min — ABNORMAL LOW (ref >60)
Glucose, Bld: 100 mg/dL — ABNORMAL HIGH (ref 70–99)
Phosphorus: 3.9 mg/dL (ref 2.5–4.6)
Potassium: 4.1 mmol/L (ref 3.5–5.1)
Sodium: 134 mmol/L — ABNORMAL LOW (ref 135–145)

## 2022-02-28 LAB — CULTURE, BLOOD (ROUTINE X 2)
Culture: NO GROWTH
Special Requests: ADEQUATE

## 2022-02-28 MED ORDER — ENOXAPARIN SODIUM 30 MG/0.3ML IJ SOSY
30.0000 mg | PREFILLED_SYRINGE | Freq: Every day | INTRAMUSCULAR | Status: DC
  Administered 2022-03-01 – 2022-03-04 (×4): 30 mg via SUBCUTANEOUS
  Filled 2022-02-28 (×4): qty 0.3

## 2022-02-28 MED ORDER — CHLORHEXIDINE GLUCONATE CLOTH 2 % EX PADS: 6.0000 | MEDICATED_PAD | Freq: Every day | CUTANEOUS | Status: DC

## 2022-02-28 MED ORDER — CEFAZOLIN IV (FOR PTA / DISCHARGE USE ONLY): 2.0000 g | INTRAVENOUS | 0 refills | Status: DC

## 2022-02-28 NOTE — Progress Notes (Signed)
[facility] PROGRESS NOTE    Progress Note  Darryl Diaz  KXF:[id number] DOB: [birth year] DOA: 02/21/2022 PCP: [name], MD     Brief Narrative:   Darryl Diaz is an 81 y.o. 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 Blood cultures repeated on 02/25/2022 are negative till date. ID was consulted now has been transitioned to IV cefazolin.   Has remained afebrile. MRI of the C-spine showed discitis and osteomyelitis with possible abscess C6-C7 TEE cannot be performed due to his C1 articular facet fracture. PT eval is pending.  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  Hypokalemia: Nephrology has been consulted, further management per hemodialysis.  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 Hemoglobin has remained relatively stable at 7.5-7.6.  C1 articular facet fracture: Currently in a c-collar. Cardiology will not proceed with TEE  Elevated troponins: In the setting of sepsis and end-stage renal disease, likely demand ischemia he denies any chest pain twelve-lead  EKG showed no signs of ischemia.  Mild aortic stenosis: Mean gradient of 12 continue to monitor as an outpatient.  Mild hyponatremia: Further management per renal with dialysis. Now resolved.  Diarrhea: Has had no further diarrhea in house.  Paroxysmal atrial fibrillation: On anticoagulation rate control continue amiodarone.  Essential hypertension: Blood pressures well controlled continue current regimen.  Hyperlipidemia: Continue statins.  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  Prolonged QTc: Noted continue to monitor on telemetry.    DVT prophylaxis: lovenox Family Communication:none Status is: Inpatient Remains inpatient appropriate because: Likely back to SNF after infectious work-up and treatment has been completed.    Code Status:     Code Status Orders  (From admission, onward)           Start     Ordered   02/22/22 0243  Do not attempt resuscitation (DNR)  Continuous       Question Answer Comment  In the event of cardiac or respiratory ARREST Do not call a "code blue"   In the event of cardiac or respiratory ARREST Do not perform Intubation, CPR, defibrillation or ACLS   In the event of cardiac or respiratory ARREST Use medication by any route, position, wound care, and other measures to relive pain and suffering. May use oxygen, suction and manual treatment of airway obstruction as needed for comfort.      02/22/22 0242           Code Status History     Date Active Date Inactive Code Status Order ID Comments User Context   01/21/2022 0603 01/27/2022 1811 DNR [id number]  Shela Leff, MD ED   11/24/2021  0007 11/26/2021 2014 DNR [id number]  Rhetta Mura, DO ED   09/27/2021 2202 09/30/2021 2305 DNR [id number]  Rhetta Mura, DO ED   06/07/2021 2050 06/14/2021 2022 DNR [id number]  Rhetta Mura, DO ED   05/02/2021 0353 05/02/2021 2111 DNR [id number]  Elwyn Reach, MD Inpatient   01/17/2021 0623 01/22/2021 2354 DNR [id number]  Reubin Milan, MD ED   01/06/2021 2258 01/09/2021 2208 DNR [id number]  Lenore Cordia, MD ED   12/02/2020 1537 12/04/2020 1641 Full Code [id number]  Karmen Bongo, MD ED   10/13/2020 2222 10/17/2020 2016 Full Code [id number]  Chotiner, Yevonne Aline, MD Inpatient   09/07/2020 2215 09/13/2020 2142 Full Code [id number]  Lenore Cordia, MD ED   03/16/2020 0811 03/20/2020 1908 Full Code [id number]  Karmen Bongo, MD ED   03/06/2020 0100 03/11/2020 1501 Full Code [id number]  Cathlyn Parsons, PA-C Inpatient   03/06/2020 0100 03/06/2020 0100 Full Code [id number]  Cathlyn Parsons, PA-C Inpatient   02/22/2020 1704 03/06/2020 0029 Full Code [id number]  Freada Bergeron, MD ED   02/05/2020 2210 02/08/2020 0218 Full Code [id number]  Barrett, Evelene Croon, PA-C ED   10/15/2019 1204 10/16/2019 1922 Full Code [id number]  Norman Herrlich Inpatient   04/20/2019 1844 04/23/2019 1723 Full Code [id number]  Donita Brooks, NP ED   02/25/2019 2021 03/05/2019 1432 Full Code [id number]  Shela Leff, MD ED   08/11/2018 1707 08/12/2018 1446 Full Code [id number]  Barb Merino, MD Inpatient   04/30/2016 0253 05/04/2016 1828 Full Code [id number]  Wylene Simmer, MD Inpatient   03/30/2016 1347 04/04/2016 1440 Full Code [id number]  Rondel Jumbo, PA-C ED   03/30/2016 1347 03/30/2016 1347 Full Code [id number]  Rondel Jumbo, PA-C ED         IV Access:   Peripheral IV   Procedures and diagnostic studies:   No results found.   Medical Consultants:   None.   Subjective:    Darryl Diaz no complaints today.  Objective:    Vitals:   02/27/22 1649 02/27/22 1949 02/28/22 0400 02/28/22 0700  BP: (!) 107/54 (!) 113/55 112/69 124/68  Pulse: 64 66 (!) 58 61  Resp: '18 18 18 18  '$ Temp: 98.4 F (36.9 C) 98.1 F (36.7 C) 98.4 F (36.9 C) 97.8 F (36.6 C)  TempSrc: Oral Oral Oral Oral  SpO2: 93% 97% 99% 100%  Weight:      Height:       SpO2: 100  %   Intake/Output Summary (Last 24 hours) at 02/28/2022 0930 Last data filed at 02/28/2022 0800 Gross per 24 hour  Intake 580 ml  Output 1000 ml  Net -420 ml    Filed Weights   02/21/22 1959  Weight: 77.6 kg    Exam: General exam: In no acute distress c-collar in place Respiratory system: Good air movement and clear to auscultation. Cardiovascular system: S1 & S2 heard, RRR. No JVD. Gastrointestinal system: Abdomen is nondistended, soft and nontender.  Extremities: No pedal edema. Skin: No rashes, lesions or ulcers  Data Reviewed:    Labs: Basic Metabolic Panel: Recent Labs  Lab 02/21/22 2009 02/22/22 0230 02/22/22 0243 02/23/22 0243 02/25/22 0322 02/26/22 1011 02/27/22 0400 02/28/22 0425  NA 137 135   < > 138 136 135 139 134*  K 3.2* 3.0*   < > 3.6 3.4* 3.3* 3.8 4.1  CL 98 100  --  99 97* 99 102 98  CO2 25 25  --  $'25 28 28 30 28  'V$ GLUCOSE 92 96  --  99 99 121* 104* 100*  BUN 20 24*  --  22 22 38* 19 31*  CREATININE 2.05* 2.32*  --  2.17* 2.03* 3.03* 2.08* 2.80*  CALCIUM 8.2* 7.9*  --  8.1* 7.9* 7.6* 7.8* 7.9*  MG 1.7 1.7  --   --   --  1.7  --   --   PHOS  --  2.4*  --   --  1.4* 2.6 2.4* 3.9   < > = values in this interval not displayed.    GFR Estimated Creatinine Clearance: 23.1 mL/min (A) (by C-G formula based on SCr of 2.8 mg/dL (H)). Liver Function Tests: Recent Labs  Lab 02/21/22 2009 02/22/22 0230 02/25/22 0322 02/26/22 1011 02/27/22 0400 02/28/22 0425  AST 40 37  --   --   --   --   ALT 17 19  --   --   --   --   ALKPHOS 76 62  --   --   --   --   BILITOT 1.6* 1.6*  --   --   --   --   PROT 5.2* 4.8*  --   --   --   --   ALBUMIN 2.6* 2.4* 1.9* 1.9* 1.8* 1.8*    No results for input(s): "LIPASE", "AMYLASE" in the last 168 hours. Recent Labs  Lab 02/22/22 0230  AMMONIA 13    Coagulation profile Recent Labs  Lab 02/21/22 2009 02/22/22 0230  INR 1.4* 1.4*    COVID-19 Labs  No results for input(s): "DDIMER", "FERRITIN",  "LDH", "CRP" in the last 72 hours.   Lab Results  Component Value Date   SARSCOV2NAA NEGATIVE 02/22/2022   SARSCOV2NAA NEGATIVE 02/21/2022   SARSCOV2NAA NEGATIVE 12/20/2021   SARSCOV2NAA POSITIVE (A) 06/03/2021    CBC: Recent Labs  Lab 02/21/22 2009 02/22/22 0243 02/23/22 0243 02/25/22 0322  WBC 7.9  --  7.9 5.5  NEUTROABS 6.8  --   --   --   HGB 8.4* 7.1* 7.6* 7.5*  HCT 23.9* 21.0* 22.6* 22.1*  MCV 100.8*  --  104.1* 101.8*  PLT 83*  --  73* 60*    Cardiac Enzymes: Recent Labs  Lab 02/22/22 0230  CKTOTAL 256    BNP (last 3 results) No results for input(s): "PROBNP" in the last 8760 hours. CBG: Recent Labs  Lab 02/23/22 2345 02/25/22 0824 02/25/22 1200 02/27/22 1201 02/27/22 1648  GLUCAP 136* 106* 139* 123* 115*    D-Dimer: No results for input(s): "DDIMER" in the last 72 hours. Hgb A1c: No results for input(s): "HGBA1C" in the last 72 hours. Lipid Profile: No results for input(s): "CHOL", "HDL", "LDLCALC", "TRIG", "CHOLHDL", "LDLDIRECT" in the last 72 hours. Thyroid function studies: No results for input(s): "TSH", "T4TOTAL", "T3FREE", "THYROIDAB" in the last 72 hours.  Invalid input(s): "FREET3"  Anemia work up: No results for input(s): "VITAMINB12", "FOLATE", "FERRITIN", "TIBC", "IRON", "RETICCTPCT" in the last 72 hours.  Sepsis Labs: Recent Labs  Lab 02/21/22 2009 02/21/22 2300 02/22/22 0230 02/23/22 0243 02/25/22 0322  PROCALCITON  --   --  8.80 10.32  --   WBC 7.9  --   --  7.9 5.5  LATICACIDVEN 2.8* 2.0* 1.4  --   --     Microbiology Recent Results (from the past 240 hour(s))  SARS Coronavirus 2 by RT PCR (hospital order, performed in [facility] hospital lab) *cepheid single result test* Anterior Nasal Swab  Status: None   Collection Time: 02/21/22  3:16 AM   Specimen: Anterior Nasal Swab  Result Value Ref Range Status   SARS Coronavirus 2 by RT PCR NEGATIVE NEGATIVE Final    Comment: (NOTE) SARS-CoV-2 target nucleic acids  are NOT DETECTED.  The SARS-CoV-2 RNA is generally detectable in upper and lower respiratory specimens during the acute phase of infection. The lowest concentration of SARS-CoV-2 viral copies this assay can detect is 250 copies / mL. A negative result does not preclude SARS-CoV-2 infection and should not be used as the sole basis for treatment or other patient management decisions.  A negative result may occur with improper specimen collection / handling, submission of specimen other than nasopharyngeal swab, presence of viral mutation(s) within the areas targeted by this assay, and inadequate number of viral copies (<250 copies / mL). A negative result must be combined with clinical observations, patient history, and epidemiological information.  Fact Sheet for Patients:   https://www.patel.info/  Fact Sheet for Healthcare Providers: https://hall.com/  This test is not yet approved or  cleared by the [place] FDA and has been authorized for detection and/or diagnosis of SARS-CoV-2 by FDA under an Emergency Use Authorization (EUA).  This EUA will remain in effect (meaning this test can be used) for the duration of the COVID-19 declaration under Section 564(b)(1) of the Act, 21 U.S.C. section 360bbb-3(b)(1), unless the authorization is terminated or revoked sooner.  Performed at [facility], [place] [address], [place], [place] [address]   Blood culture (routine x 2)     Status: Abnormal   Collection Time: 02/21/22  4:07 AM   Specimen: BLOOD RIGHT HAND  Result Value Ref Range Status   Specimen Description BLOOD RIGHT HAND  Final   Special Requests AEROBIC BOTTLE ONLY Blood Culture adequate volume  Final   Culture  Setup Time   Final    GRAM POSITIVE COCCI IN CLUSTERS AEROBIC BOTTLE ONLY CRITICAL RESULT CALLED TO, READ BACK BY AND VERIFIED WITH: [place] ON 02/21/22 @ 2022 BY DRT Performed at [facility],  [place] [address], [place], [place] [address]    Culture STAPHYLOCOCCUS AUREUS (A)  Final   Report Status 02/23/2022 FINAL  Final   Organism ID, Bacteria STAPHYLOCOCCUS AUREUS  Final      Susceptibility   Staphylococcus aureus - MIC*    CIPROFLOXACIN <=0.5 SENSITIVE Sensitive     ERYTHROMYCIN >=8 RESISTANT Resistant     GENTAMICIN <=0.5 SENSITIVE Sensitive     OXACILLIN <=0.25 SENSITIVE Sensitive     TETRACYCLINE >=16 RESISTANT Resistant     VANCOMYCIN <=0.5 SENSITIVE Sensitive     TRIMETH/SULFA <=10 SENSITIVE Sensitive     CLINDAMYCIN RESISTANT Resistant     RIFAMPIN <=0.5 SENSITIVE Sensitive     Inducible Clindamycin POSITIVE Resistant     * STAPHYLOCOCCUS AUREUS  Blood Culture ID Panel (Reflexed)     Status: Abnormal   Collection Time: 02/21/22  4:07 AM  Result Value Ref Range Status   Enterococcus faecalis NOT DETECTED NOT DETECTED Final   Enterococcus Faecium NOT DETECTED NOT DETECTED Final   Listeria monocytogenes NOT DETECTED NOT DETECTED Final   Staphylococcus species DETECTED (A) NOT DETECTED Final    Comment: CRITICAL RESULT CALLED TO, READ BACK BY AND VERIFIED WITH: PHARM CATHY PEARCE ON 02/21/22 @ 2022 BY DRT    Staphylococcus aureus (BCID) DETECTED (A) NOT DETECTED Final    Comment: CRITICAL RESULT CALLED TO, READ BACK BY AND VERIFIED WITH:  [place] ON 02/21/22 @ 2022 BY DRT    Staphylococcus epidermidis NOT DETECTED NOT DETECTED Final   Staphylococcus lugdunensis NOT DETECTED NOT DETECTED Final   Streptococcus species NOT DETECTED NOT DETECTED Final   Streptococcus agalactiae NOT DETECTED NOT DETECTED Final   Streptococcus pneumoniae NOT DETECTED NOT DETECTED Final   Streptococcus pyogenes NOT DETECTED NOT DETECTED Final   A.calcoaceticus-baumannii NOT DETECTED NOT DETECTED Final   Bacteroides fragilis NOT DETECTED NOT DETECTED Final   Enterobacterales NOT DETECTED NOT DETECTED Final   Enterobacter cloacae complex NOT DETECTED NOT DETECTED Final   Escherichia  coli NOT DETECTED NOT DETECTED Final   Klebsiella aerogenes NOT DETECTED NOT DETECTED Final   Klebsiella oxytoca NOT DETECTED NOT DETECTED Final   Klebsiella pneumoniae NOT DETECTED NOT DETECTED Final   Proteus species NOT DETECTED NOT DETECTED Final   Salmonella species NOT DETECTED NOT DETECTED Final   Serratia marcescens NOT DETECTED NOT DETECTED Final   Haemophilus influenzae NOT DETECTED NOT DETECTED Final   Neisseria meningitidis NOT DETECTED NOT DETECTED Final   Pseudomonas aeruginosa NOT DETECTED NOT DETECTED Final   Stenotrophomonas maltophilia NOT DETECTED NOT DETECTED Final   Candida albicans NOT DETECTED NOT DETECTED Final   Candida auris NOT DETECTED NOT DETECTED Final   Candida glabrata NOT DETECTED NOT DETECTED Final   Candida krusei NOT DETECTED NOT DETECTED Final   Candida parapsilosis NOT DETECTED NOT DETECTED Final   Candida tropicalis NOT DETECTED NOT DETECTED Final   Cryptococcus neoformans/gattii NOT DETECTED NOT DETECTED Final   Meth resistant mecA/C and MREJ NOT DETECTED NOT DETECTED Final    Comment: Performed at [facility], [address] [place], [place] [address]  Blood culture (routine x 2)     Status: Abnormal   Collection Time: 02/21/22  6:19 AM   Specimen: BLOOD LEFT HAND  Result Value Ref Range Status   Specimen Description BLOOD LEFT HAND  Final   Special Requests AEROBIC BOTTLE ONLY Blood Culture adequate volume  Final   Culture  Setup Time   Final    GRAM POSITIVE COCCI IN CLUSTERS AEROBIC BOTTLE ONLY CRITICAL VALUE NOTED.  VALUE IS CONSISTENT WITH PREVIOUSLY REPORTED AND CALLED VALUE.    Culture (A)  Final    STAPHYLOCOCCUS AUREUS SUSCEPTIBILITIES PERFORMED ON PREVIOUS CULTURE WITHIN THE LAST 5 DAYS. Performed at [facility], [place] [address] [place], [place] [address]    Report Status 02/23/2022 FINAL  Final  Blood Culture (routine x 2)     Status: Abnormal   Collection Time: 02/21/22  8:42 PM   Specimen: BLOOD RIGHT FOREARM   Result Value Ref Range Status   Specimen Description BLOOD RIGHT FOREARM  Final   Special Requests   Final    BOTTLES DRAWN AEROBIC AND ANAEROBIC Blood Culture adequate volume   Culture  Setup Time   Final    GRAM POSITIVE COCCI IN CLUSTERS IN BOTH AEROBIC AND ANAEROBIC BOTTLES CRITICAL VALUE NOTED.  VALUE IS CONSISTENT WITH PREVIOUSLY REPORTED AND CALLED VALUE.    Culture (A)  Final    STAPHYLOCOCCUS AUREUS SUSCEPTIBILITIES PERFORMED ON PREVIOUS CULTURE WITHIN THE LAST 5 DAYS. Performed at [facility], [place] [address], [place], [place] [address]    Report Status 02/24/2022 FINAL  Final  Blood Culture (routine x 2)     Status: Abnormal   Collection Time: 02/21/22  8:49 PM   Specimen: BLOOD  Result Value Ref Range Status   Specimen Description BLOOD RIGHT ANTECUBITAL  Final   Special Requests   Final    BOTTLES DRAWN AEROBIC AND ANAEROBIC Blood Culture adequate volume   Culture  Setup Time   Final    GRAM POSITIVE COCCI IN CLUSTERS IN BOTH AEROBIC AND ANAEROBIC BOTTLES CRITICAL RESULT CALLED TO, READ BACK BY AND VERIFIED WITH: PHARMD E STEIMBOCK [id number] AT 1234 BY CM    Culture (A)  Final    STAPHYLOCOCCUS AUREUS SUSCEPTIBILITIES PERFORMED ON PREVIOUS CULTURE WITHIN THE LAST 5 DAYS. Performed at [facility], [place] [address] [place], [place] [address]    Report Status 02/24/2022 FINAL  Final  Urine Culture     Status: Abnormal   Collection Time: 02/22/22  3:28 AM   Specimen: Urine, Clean Catch  Result Value Ref Range Status   Specimen Description URINE, CLEAN CATCH  Final   Special Requests NONE  Final   Culture (A)  Final    <10,000 COLONIES/mL INSIGNIFICANT GROWTH Performed at [facility], [place] [address], [place], [place] [address]    Report Status 02/23/2022 FINAL  Final  Resp Panel by RT-PCR (Flu A&B, Covid) Anterior Nasal Swab     Status: None   Collection Time: 02/22/22  3:28 AM   Specimen: Anterior Nasal Swab  Result Value Ref Range Status    SARS Coronavirus 2 by RT PCR NEGATIVE NEGATIVE Final    Comment: (NOTE) SARS-CoV-2 target nucleic acids are NOT DETECTED.  The SARS-CoV-2 RNA is generally detectable in upper respiratory specimens during the acute phase of infection. The lowest concentration of SARS-CoV-2 viral copies this assay can detect is 138 copies/mL. A negative result does not preclude SARS-Cov-2 infection and should not be used as the sole basis for treatment or other patient management decisions. A negative result may occur with  improper specimen collection/handling, submission of specimen other than nasopharyngeal swab, presence of viral mutation(s) within the areas targeted by this assay, and inadequate number of viral copies(<138 copies/mL). A negative result must be combined with clinical observations, patient history, and epidemiological information. The expected result is Negative.  Fact Sheet for Patients:  EntrepreneurPulse.com.au  Fact Sheet for Healthcare Providers:  IncredibleEmployment.be  This test is no t yet approved or cleared by the [place] FDA and  has been authorized for detection and/or diagnosis of SARS-CoV-2 by FDA under an Emergency Use Authorization (EUA). This EUA will remain  in effect (meaning this test can be used) for the duration of the COVID-19 declaration under Section 564(b)(1) of the Act, 21 U.S.C.section 360bbb-3(b)(1), unless the authorization is terminated  or revoked sooner.       Influenza A by PCR NEGATIVE NEGATIVE Final   Influenza B by PCR NEGATIVE NEGATIVE Final    Comment: (NOTE) The Xpert Xpress SARS-CoV-2/FLU/RSV plus assay is intended as an aid in the diagnosis of influenza from Nasopharyngeal swab specimens and should not be used as a sole basis for treatment. Nasal washings and aspirates are unacceptable for Xpert Xpress SARS-CoV-2/FLU/RSV testing.  Fact Sheet for  Patients: EntrepreneurPulse.com.au  Fact Sheet for Healthcare Providers: IncredibleEmployment.be  This test is not yet approved or cleared by the [place] FDA and has been authorized for detection and/or diagnosis of SARS-CoV-2 by FDA under an Emergency Use Authorization (EUA). This EUA will remain in effect (meaning this test can be used) for the duration of the COVID-19 declaration under Section 564(b)(1) of the Act, 21 U.S.C. section 360bbb-3(b)(1), unless the authorization is terminated or revoked.  Performed at [facility],  [address] [place], [place] [address]   Respiratory (~20 pathogens) panel by PCR     Status: None   Collection Time: 02/22/22  3:28 AM   Specimen: Urine, Clean Catch; Respiratory  Result Value Ref Range Status   Adenovirus NOT DETECTED NOT DETECTED Final   Coronavirus 229E NOT DETECTED NOT DETECTED Final    Comment: (NOTE) The Coronavirus on the Respiratory Panel, DOES NOT test for the novel  Coronavirus (2019 nCoV)    Coronavirus HKU1 NOT DETECTED NOT DETECTED Final   Coronavirus NL63 NOT DETECTED NOT DETECTED Final   Coronavirus OC43 NOT DETECTED NOT DETECTED Final   Metapneumovirus NOT DETECTED NOT DETECTED Final   Rhinovirus / Enterovirus NOT DETECTED NOT DETECTED Final   Influenza A NOT DETECTED NOT DETECTED Final   Influenza B NOT DETECTED NOT DETECTED Final   Parainfluenza Virus 1 NOT DETECTED NOT DETECTED Final   Parainfluenza Virus 2 NOT DETECTED NOT DETECTED Final   Parainfluenza Virus 3 NOT DETECTED NOT DETECTED Final   Parainfluenza Virus 4 NOT DETECTED NOT DETECTED Final   Respiratory Syncytial Virus NOT DETECTED NOT DETECTED Final   Bordetella pertussis NOT DETECTED NOT DETECTED Final   Bordetella Parapertussis NOT DETECTED NOT DETECTED Final   Chlamydophila pneumoniae NOT DETECTED NOT DETECTED Final   Mycoplasma pneumoniae NOT DETECTED NOT DETECTED Final    Comment: Performed at  [facility], [place] [address], [place], [place] [address]  MRSA Next Gen by PCR, Nasal     Status: None   Collection Time: 02/22/22  3:33 AM   Specimen: STOOL; Nasal Swab  Result Value Ref Range Status   MRSA by PCR Next Gen NOT DETECTED NOT DETECTED Final    Comment: (NOTE) The GeneXpert MRSA Assay (FDA approved for NASAL specimens only), is one component of a comprehensive MRSA colonization surveillance program. It is not intended to diagnose MRSA infection nor to guide or monitor treatment for MRSA infections. Test performance is not FDA approved in patients less than 37 years old. Performed at [facility], [place] [address], [place], [place] [address]   Culture, blood (Routine X 2) w Reflex to ID Panel     Status: Abnormal   Collection Time: 02/23/22  2:43 AM   Specimen: BLOOD  Result Value Ref Range Status   Specimen Description BLOOD SITE NOT SPECIFIED  Final   Special Requests   Final    BOTTLES DRAWN AEROBIC AND ANAEROBIC Blood Culture results may not be optimal due to an inadequate volume of blood received in culture bottles   Culture  Setup Time   Final    GRAM POSITIVE COCCI IN CLUSTERS AEROBIC BOTTLE ONLY CRITICAL RESULT CALLED TO, READ BACK BY AND VERIFIED WITH: PHARMD MICHAEL Hershey ON 02/23/22 @ 2027 BY DRT    Culture (A)  Final    STAPHYLOCOCCUS AUREUS SUSCEPTIBILITIES PERFORMED ON PREVIOUS CULTURE WITHIN THE LAST 5 DAYS. Performed at [facility], [place] [address] [place],  [address]    Report Status 02/24/2022 FINAL  Final  Culture, blood (Routine X 2) w Reflex to ID Panel     Status: None   Collection Time: 02/23/22  2:43 AM   Specimen: BLOOD  Result Value Ref Range Status   Specimen Description BLOOD SITE NOT SPECIFIED  Final   Special Requests   Final    BOTTLES DRAWN AEROBIC AND ANAEROBIC Blood Culture adequate volume   Culture   Final    NO GROWTH 5 DAYS  Performed at [facility], [place] [address], [place], [place] [address]     Report Status 02/28/2022 FINAL  Final  Culture, blood (Routine X 2) w Reflex to ID Panel     Status: None (Preliminary result)   Collection Time: 02/25/22  8:23 AM   Specimen: BLOOD RIGHT ARM  Result Value Ref Range Status   Specimen Description BLOOD RIGHT ARM  Final   Special Requests   Final    BOTTLES DRAWN AEROBIC AND ANAEROBIC Blood Culture adequate volume   Culture   Final    NO GROWTH 3 DAYS Performed at [facility], [place] [address] [place], [place] [address]    Report Status PENDING  Incomplete  Culture, blood (Routine X 2) w Reflex to ID Panel     Status: None (Preliminary result)   Collection Time: 02/25/22  8:30 AM   Specimen: BLOOD  Result Value Ref Range Status   Specimen Description BLOOD RIGHT ANTECUBITAL  Final   Special Requests   Final    BOTTLES DRAWN AEROBIC AND ANAEROBIC Blood Culture adequate volume   Culture   Final    NO GROWTH 3 DAYS Performed at [facility], [place] [address] [place], [place] [address]    Report Status PENDING  Incomplete     Medications:    amiodarone  200 mg Oral q morning   carvedilol  3.125 mg Oral BID WC   Chlorhexidine Gluconate Cloth  6 each Topical Q0600   darbepoetin (ARANESP) injection - DIALYSIS  200 mcg Intravenous Q Thu-HD   enoxaparin (LOVENOX) injection  40 mg Subcutaneous Daily   escitalopram  10 mg Oral q morning   feeding supplement  237 mL Oral BID BM   lidocaine  3 patch Transdermal Q24H   sodium chloride flush  3 mL Intravenous Q12H   tamsulosin  0.4 mg Oral QHS   Continuous Infusions:  sodium chloride      ceFAZolin (ANCEF) IV Stopped (02/26/22 2310)      LOS: 6 days   Charlynne Cousins  [facility]  02/28/2022, 9:30 AM

## 2022-02-28 NOTE — Progress Notes (Addendum)
Darryl Diaz Progress Note   Subjective:    Last HD on 10/21 with 0.8 kg UF.  Per charting was cut short due to patient agitation.  He is pleasant this am.  States he has "a lot of questions  - I don't know what happened or how I got here"    Review of systems:  Denies shortness of breath or chest pain Denies n/v/d  Objective Vitals:   02/27/22 0918 02/27/22 1649 02/27/22 1949 02/28/22 0400  BP: 124/63 (!) 107/54 (!) 113/55 112/69  Pulse: 73 64 66 (!) 58  Resp: '17 18 18 18  '$ Temp: 98.9 F (37.2 C) 98.4 F (36.9 C) 98.1 F (36.7 C) 98.4 F (36.9 C)  TempSrc: Oral Oral Oral Oral  SpO2:  93% 97% 99%  Weight:      Height:       Physical Exam   General elderly male in bed in no acute distress HEENT normocephalic atraumatic extraocular movements intact sclera anicteric Neck supple trachea midline Lungs clear to auscultation bilaterally normal work of breathing at rest  Heart S1S2 no rub Abdomen soft nontender nondistended Extremities no edema  Psych no agitation; somewhat anxious  Neuro - awake on arrival and oriented to person, year, and location but not to situation  Access LUE AVF bruit and thrill    Filed Weights   02/21/22 1959  Weight: 77.6 kg    Intake/Output Summary (Last 24 hours) at 02/28/2022 0735 Last data filed at 02/28/2022 3419 Gross per 24 hour  Intake 580 ml  Output 300 ml  Net 280 ml     Additional Objective Labs: Basic Metabolic Panel: Recent Labs  Lab 02/26/22 1011 02/27/22 0400 02/28/22 0425  NA 135 139 134*  K 3.3* 3.8 4.1  CL 99 102 98  CO2 '28 30 28  '$ GLUCOSE 121* 104* 100*  BUN 38* 19 31*  CREATININE 3.03* 2.08* 2.80*  CALCIUM 7.6* 7.8* 7.9*  PHOS 2.6 2.4* 3.9   Liver Function Tests: Recent Labs  Lab 02/21/22 2009 02/22/22 0230 02/25/22 0322 02/26/22 1011 02/27/22 0400 02/28/22 0425  AST 40 37  --   --   --   --   ALT 17 19  --   --   --   --   ALKPHOS 76 62  --   --   --   --   BILITOT 1.6* 1.6*  --    --   --   --   PROT 5.2* 4.8*  --   --   --   --   ALBUMIN 2.6* 2.4*   < > 1.9* 1.8* 1.8*   < > = values in this interval not displayed.   CBC: Recent Labs  Lab 02/21/22 2009 02/22/22 0243 02/23/22 0243 02/25/22 0322  WBC 7.9  --  7.9 5.5  NEUTROABS 6.8  --   --   --   HGB 8.4* 7.1* 7.6* 7.5*  HCT 23.9* 21.0* 22.6* 22.1*  MCV 100.8*  --  104.1* 101.8*  PLT 83*  --  73* 60*   Blood Culture    Component Value Date/Time   SDES BLOOD RIGHT ANTECUBITAL 02/25/2022 0830   SPECREQUEST  02/25/2022 0830    BOTTLES DRAWN AEROBIC AND ANAEROBIC Blood Culture adequate volume   CULT  02/25/2022 0830    NO GROWTH 2 DAYS Performed at [facility] [address] [place], [place] [address]    REPTSTATUS PENDING 02/25/2022 0830   Cardiac Enzymes: Recent Labs  Lab 02/22/22 0230  CKTOTAL 256   Iron Studies:  No results for input(s): "IRON", "TIBC", "TRANSFERRIN", "FERRITIN" in the last 72 hours.  Lab Results  Component Value Date   INR 1.4 (H) 02/22/2022   INR 1.4 (H) 02/21/2022   INR 1.1 11/23/2021   Studies/Results: No results found.  Medications:  sodium chloride      ceFAZolin (ANCEF) IV Stopped (02/26/22 2310)    amiodarone  200 mg Oral q morning   carvedilol  3.125 mg Oral BID WC   Chlorhexidine Gluconate Cloth  6 each Topical Q0600   darbepoetin (ARANESP) injection - DIALYSIS  200 mcg Intravenous Q Thu-HD   enoxaparin (LOVENOX) injection  40 mg Subcutaneous Daily   escitalopram  10 mg Oral q morning   feeding supplement  237 mL Oral BID BM   lidocaine  3 patch Transdermal Q24H   sodium chloride flush  3 mL Intravenous Q12H   tamsulosin  0.4 mg Oral QHS    Dialysis Orders: TTS - [facility] 4hrs, BFR 400/AF 1.5,  EDW 79.8 kg, 3.0 K/ 2.5 Ca   Access: LU AVF  No Heparin  Mircera 150 mcg q2wks - last 9/27  Assessment/Plan: Sepsis due to gram-positive bacteremia in setting of left foot cellulitis and left foot osteomyelitis  -CT with likely PNA.  Started on  broad spectrum ABX. ID consulted and ABX narrowed to IV cefazolin.  Blood cultures 02/20/2021 with gram-positive cocci in clusters.  Surveillance blood cultures 02/22/2022 negative.  ID recommended MRI thoracic/lumbar spine and TEE.  MRI thoracic showed signal intensity at C6-C7 interspace which could be degenerative vs osteomyelitis.  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  MRI cervical spine with findings suspicious for discitis and osteomyelitis. No epidural abscess.  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 Sharol Given with orthopedics. No surgical intervention indicated at this time.  Does not think foot is cause of sepsis  Elevated troponin   - Per primary team.  Was felt 2/2 setting of sepsis  4. Hypokalemia - using higher k bath with HD   7. ESRD  - On HD TTS schedule.  Thursday and Saturday there were issues due to pt's dementia and agitation.  Hope that this is due to acute illness -  if does not clear he cannot be dialyzed in the OP setting like this as it would not be safe.  Attempt HD again on Tuesday and hopeful for improvement in agitation -AVF does not appear to be source of bacteremia              8. Hypertension/volume   - Coreg held on admission - Does not appear fluid overloaded.  now under his EDW-  min UF with HD-     9. Anemia of CKD  - CBC in am  - ESA  increased dose to 276mg every Thursday - tsat 10%, ferritin 1700, no IV iron.  - Transfuse if Hbg <7.0   10. Secondary Hyperparathyroidism  - hx hypophosphatemia and prev needed repletion - improved - Not on VDRA or binders.    11. Dispo- would consider another palliative discussion -  he is failing to thrive   LRozelle Diaz Diaz 02/28/2022,7:56 AM   Addendum:   Contacted by pharmacy after they rounded with ID.  Final recs:  Cefazolin 2g/HD-T/Th/Sat for 6 weeks with an  end date of  04/08/22  Claudia Desanctis, MD 1:15 PM 02/28/2022

## 2022-02-28 NOTE — Progress Notes (Addendum)
Physical Therapy Treatment Patient Details Name: Darryl Diaz MRN: [id number] DOB: [name] 13, 1941 Today's Date: 02/28/2022   History of Present Illness Pt is an 81 y/o M presented from SNF for decreased PO Intake, swelling and redness of the left foot.  Recent admit 9/15 after fall at home with C63f, continues to wear C-collar. PMH includes alcohol abuse, ESRD on HD MWF, CHF, HTN, and HLD.    PT Comments    Patient progressing slowly towards PT goals. Session focused on transfers and there ex. Pt continues to be limited by pain in back and fear of falling. Requires MAx A of bed mobility and varying assist needed for sitting balance. Tolerated there ex in supine and sitting EOB. Pt motivated to work with therapist to improve strength. Cognition seems to be improving some. Will need second person for standing attempts as pt resists movements due to fear. Continues to be appropriate for SNF. Will follow.    Recommendations for follow up therapy are one component of a multi-disciplinary discharge planning process, led by the attending physician.  Recommendations may be updated based on patient status, additional functional criteria and insurance authorization.  Follow Up Recommendations  Skilled nursing-short term rehab (<3 hours/day) Can patient physically be transported by private vehicle: No   Assistance Recommended at Discharge Frequent or constant Supervision/Assistance  Patient can return home with the following Two people to help with walking and/or transfers;A lot of help with bathing/dressing/bathroom;Assistance with cooking/housework;Direct supervision/assist for medications management;Direct supervision/assist for financial management;Assist for transportation;Help with stairs or ramp for entrance   Equipment Recommendations  None recommended by PT    Recommendations for Other Services       Precautions / Restrictions Precautions Precautions: Cervical;Fall Precaution Booklet  Issued: No Required Braces or Orthoses: Cervical Brace Cervical Brace: Hard collar;At all times Restrictions Weight Bearing Restrictions: No     Mobility  Bed Mobility Overal bed mobility: Needs Assistance Bed Mobility: Rolling, Sidelying to Sit Rolling: Min assist Sidelying to sit: Max assist, HOB elevated   Sit to supine: Max assist, HOB elevated   General bed mobility comments: Step by step cues for sequencing to reach for rail, assist with LEs and to elevate trunk, pt resisting initially due to fear of falling pushign backwards.    Transfers                   General transfer comment: Deferred due to pt pushing back posteriorly and fearful of falling, needs second person    Ambulation/Gait                   Stairs             Wheelchair Mobility    Modified Rankin (Stroke Patients Only)       Balance Overall balance assessment: Needs assistance, History of Falls Sitting-balance support: Feet supported, Bilateral upper extremity supported Sitting balance-Leahy Scale: Poor Sitting balance - Comments: Requires varying assist for EOB balance, initially with posterior lean/fear of falling,anywhere from Max A to Min guard assist with UE support. Postural control: Posterior lean                                  Cognition Arousal/Alertness: Awake/alert Behavior During Therapy: WFL for tasks assessed/performed Overall Cognitive Status: No family/caregiver present to determine baseline cognitive functioning  General Comments: Follows commands well, however fearful of falling, "i cant i cant" Seems more clear today cognitively. "How [employer] get like this?"        Exercises General Exercises - Lower Extremity Ankle Circles/Pumps: AROM, Both, 10 reps, Supine Quad Sets: AROM, Both, 10 reps, Supine Long Arc Quad: AROM, Both, 10 reps, Seated Heel Slides: AROM, AAROM, Both, 10 reps,  Supine Hip ABduction/ADduction: AAROM, Both, 10 reps, Supine    General Comments        Pertinent Vitals/Pain Pain Assessment Pain Assessment: Faces Faces Pain Scale: Hurts whole lot Pain Location: back Pain Descriptors / Indicators: Guarding, Grimacing, Aching, Sore Pain Intervention(s): Monitored during session, Repositioned, Limited activity within patient's tolerance    Home Living                          Prior Function            PT Goals (current goals can now be found in the care plan section) Progress towards PT goals: Progressing toward goals (slowly)    Frequency    Min 3X/week      PT Plan Current plan remains appropriate    Co-evaluation              AM-PAC PT "6 Clicks" Mobility   Outcome Measure  Help needed turning from your back to your side while in a flat bed without using bedrails?: A Lot Help needed moving from lying on your back to sitting on the side of a flat bed without using bedrails?: A Lot Help needed moving to and from a bed to a chair (including a wheelchair)?: Total Help needed standing up from a chair using your arms (e.g., wheelchair or bedside chair)?: Total Help needed to walk in hospital room?: Total Help needed climbing 3-5 steps with a railing? : Total 6 Click Score: 8    End of Session   Activity Tolerance: Other (comment);Patient limited by pain (fear of falling) Patient left: in bed;with bed alarm set;with call bell/phone within reach Nurse Communication: Mobility status PT Visit Diagnosis: Muscle weakness (generalized) (M62.81);History of falling (Z91.81);Difficulty in walking, not elsewhere classified (R26.2);Pain Pain - part of body:  (back)     Time: 5956-3875 PT Time Calculation (min) (ACUTE ONLY): 19 min  Charges:  $Therapeutic Exercise: 8-22 mins                     Marisa Severin, PT, DPT Acute Rehabilitation Services Secure chat preferred Office [place] 02/28/2022, 9:59 AM

## 2022-02-28 NOTE — TOC Progression Note (Addendum)
Transition of Care [facility]) - Progression Note    Patient Details  Name: Darryl Diaz MRN: [id number] Date of Birth: [birth year]  Transition of [facility]) CM/SW [place], RN Phone Number: 02/28/2022, 2:23 PM  Clinical Narrative:    CM called and spoke with the patient's son, Rolly Salter on the phone and offered Medicare choice regarding bed offers in the hub and the patient's son would prefer 1. [facility], 2. [facility] - no offered, and 3. [facility].  The patient's son requested [facility].  I called Nikki, CM at [facility] and they are able to offer a bed at the facility if the dialysis facility can be switched to [facility] location.  I called and spoke with Jerlyn Ly, Dialysis Coordinator and she will switch the patient's dialysis facility to [facility] location.  I called and spoke with the patient's son and he is agreeable to SNF placement at [facility].  The patient will not be medically stable for the next 1-2 days per attending physician.  CM will continue to follow the patient for SNF placement at [facility] once medically stable for discharge.   Expected Discharge Plan: [place] Barriers to Discharge: Continued Medical Work up  Expected Discharge Plan and Services Expected Discharge Plan: [place]   Discharge Planning Services: CM Consult Post Acute Care Choice: [place] Living arrangements for the past 2 months: [place] ([place])                                       Social Determinants of Health (SDOH) Interventions    Readmission Risk Interventions    02/24/2022    2:15 PM 09/29/2021    2:21 PM 01/18/2021    9:28 AM  Readmission Risk Prevention Plan  Transportation Screening Complete Complete Complete  Medication Review Press photographer) Complete Complete Complete  PCP or Specialist appointment within 3-5 days of  discharge Complete Complete Complete  HRI or Home Care Consult Complete Complete Complete  SW Recovery Care/Counseling Consult Complete Complete Complete  Palliative Care Screening Not Applicable Not Applicable Not Applicable  Skilled Nursing Facility Complete Complete Complete

## 2022-02-28 NOTE — Progress Notes (Signed)
Contacted by RN CM regarding pt's need to change HD clinics due to snf placement. Pt would like to return to [facility] which requires pt receive HD at [facility] on MWF. Contacted [place] [facility] and spoke to [place]. Clinic has a MWF 11:30 chair time available. Info provided to RN CM who provided to snf. SNF agreeable to this schedule. Spoke to [place] at [facility] who states pt can start at clinic as soon as Wednesday if needed. Pt will need to arrive at 11:00 for 11:30 chair time. This info was provided to RN CM to provide to SNF. Spoke to [employer] at [place] to make her aware of situation/plans. Update provided to nephrologist and renal PA as well. Will assist as needed.   Melven Sartorius Renal Navigator [phone]

## 2022-02-28 NOTE — Progress Notes (Signed)
Occupational Therapy Treatment Patient Details Name: Darryl Diaz MRN: [id number] DOB: 07-26-1940 Today's Date: 02/28/2022   History of present illness Pt is an 81 y/o M presented from SNF for decreased PO Intake, swelling and redness of the left foot.  Pt now with significant low back pain starting on 10/23.Recent admit 9/15 after fall at home with C14f, continues to wear C-collar. PMH includes alcohol abuse, ESRD on HD MWF, CHF, HTN, and HLD.   OT comments  Pt in a great amount of pain today in his low back. Pt unable to tolerate laying flat on his back.  After moving to EOB with max assist with heavy push backward, pt was left on side with pillow between legs and seemed to get some relief. Pt also given pain meds when therapist in room.  Nursing aware pt is in a great amount of pain.   Recommendations for follow up therapy are one component of a multi-disciplinary discharge planning process, led by the attending physician.  Recommendations may be updated based on patient status, additional functional criteria and insurance authorization.    Follow Up Recommendations  Skilled nursing-short term rehab (<3 hours/day)    Assistance Recommended at Discharge Frequent or constant Supervision/Assistance  Patient can return home with the following  Two people to help with walking and/or transfers;Assist for transportation;A lot of help with bathing/dressing/bathroom   Equipment Recommendations  Wheelchair cushion (measurements OT);Wheelchair (measurements OT)    Recommendations for Other Services      Precautions / Restrictions Precautions Precautions: Cervical;Fall Precaution Booklet Issued: No Required Braces or Orthoses: Cervical Brace Cervical Brace: Hard collar;At all times Restrictions Weight Bearing Restrictions: No       Mobility Bed Mobility Overal bed mobility: Needs Assistance Bed Mobility: Rolling, Sidelying to Sit Rolling: Min assist Sidelying to sit: Max assist,  HOB elevated   Sit to supine: Max assist, HOB elevated   General bed mobility comments: Pt resisted all movement today.  Pt in pain and fearful of falling.    Transfers                   General transfer comment: Deferred due to pt pushing back posteriorly and fearful of falling, needs second person     Balance Overall balance assessment: Needs assistance, History of Falls Sitting-balance support: Feet supported, Bilateral upper extremity supported Sitting balance-Leahy Scale: Poor Sitting balance - Comments: Requires varying assist for EOB balance, initially with posterior lean/fear of falling,anywhere from Max A to Min guard assist with UE support. Postural control: Posterior lean     Standing balance comment: uable to tolerate standing today due to pain and fear.                           ADL either performed or assessed with clinical judgement   ADL Overall ADL's : Needs assistance/impaired     Grooming: Wash/dry hands;Wash/dry face;Set up;Oral care;Sitting                                 General ADL Comments: Pt unable to tolerate standing with +1 assist. Pt unable to tolerate OOB adls due to pain and inability to focus on anything outside of pain.  Pt returned to supine and assisted with positioning to get pt comfortable on his side.    Extremity/Trunk Assessment Upper Extremity Assessment Upper Extremity Assessment: Overall WFL for tasks assessed  Lower Extremity Assessment Lower Extremity Assessment: Defer to PT evaluation        Vision   Vision Assessment?: No apparent visual deficits   Perception Perception Perception: Not tested   Praxis Praxis Praxis: Intact    Cognition Arousal/Alertness: Awake/alert Behavior During Therapy: WFL for tasks assessed/performed Overall Cognitive Status: No family/caregiver present to determine baseline cognitive functioning                                 General  Comments: Pt followed simple commands well. Pt confused as to year and date but did get month. Pt very fretful during this session due to low back pain. Pt could not get comfortable and pleading to control pain.  Nursing gave pain meds and therapist assisted with positioning on side which appeared to help.        Exercises      Shoulder Instructions       General Comments Pt limited today by pain in low back    Pertinent Vitals/ Pain       Pain Assessment Pain Assessment: Faces Pain Score: 10-Worst pain ever Faces Pain Scale: Hurts worst Pain Location: back Pain Descriptors / Indicators: Guarding, Grimacing, Aching, Sore Pain Intervention(s): Limited activity within patient's tolerance, Monitored during session, Repositioned, Patient requesting pain meds-RN notified, RN gave pain meds during session  Home Living                                          Prior Functioning/Environment              Frequency  Min 2X/week        Progress Toward Goals  OT Goals(current goals can now be found in the care plan section)  Progress towards OT goals: Not progressing toward goals - comment (pt in pain)  Acute Rehab OT Goals OT Goal Formulation: Patient unable to participate in goal setting Time For Goal Achievement: 03/13/2022 Potential to Achieve Goals: Good ADL Goals Pt Will Perform Grooming: with supervision;sitting Pt Will Perform Upper Body Bathing: with min assist;sitting Pt Will Perform Upper Body Dressing: with min assist;sitting Pt Will Perform Lower Body Dressing: with mod assist;sitting/lateral leans Pt Will Transfer to Toilet: with min assist;with +2 assist;stand pivot transfer;bedside commode  Plan Discharge plan remains appropriate    Co-evaluation                 AM-PAC OT "6 Clicks" Daily Activity     Outcome Measure   Help from another person eating meals?: A Little Help from another person taking care of personal grooming?: A  Little Help from another person toileting, which includes using toliet, bedpan, or urinal?: Total Help from another person bathing (including washing, rinsing, drying)?: A Lot Help from another person to put on and taking off regular upper body clothing?: A Lot Help from another person to put on and taking off regular lower body clothing?: A Lot 6 Click Score: 13    End of Session    OT Visit Diagnosis: Unsteadiness on feet (R26.81);History of falling (Z91.81)   Activity Tolerance Patient limited by pain   Patient Left in bed;with call bell/phone within reach   Nurse Communication Mobility status;Patient requests pain meds        Time: 2725-3664 OT Time Calculation (min): 14 min  Charges: OT  General Charges $OT Visit: 1 Visit OT Treatments $Self Care/Home Management : 8-22 mins   Glenford Peers 02/28/2022, 11:09 AM

## 2022-02-28 NOTE — Progress Notes (Addendum)
PHARMACY CONSULT NOTE FOR:  OUTPATIENT  PARENTERAL ANTIBIOTIC THERAPY (OPAT)  Informational only as this patient is to receive outpatients with hemodialysis outpatient  Indication: MSSA discitis/osteo Regimen: Cefazolin 2g/HD-TTS End date: 04/08/22 (6 weeks from 10/20 BCx clearance)  IV antibiotic discharge orders are pended. To discharging provider:  please sign these orders via discharge navigator,  Select New Orders & click on the button choice - Manage This Unsigned Work.     Thank you for allowing pharmacy to be a part of this patient's care.  Alycia Rossetti, PharmD, BCPS Infectious Diseases Clinical Pharmacist 02/28/2022 7:48 AM   **Pharmacist phone directory can now be found on [email or web address] ([email or web address]).  Listed under [place].

## 2022-02-28 NOTE — Progress Notes (Signed)
Speech Language Pathology Treatment: Dysphagia  Patient Details Name: Darryl Diaz MRN: [id number] DOB: Feb 04, 1941 Today's Date: 02/28/2022 Time: 3151-7616 SLP Time Calculation (min) (ACUTE ONLY): 13 min  Assessment / Plan / Recommendation Clinical Impression  Session today was limited by pain in back (RN aware, states that pt has been declining PO intake, not eating much and not sitting up to eat). Pt was initially agreeable to repositioning but did have to use reverse Trendelenburg to get him mostly upright (as much as he would tolerate. Cues were given prior to providing cup of water to use aspiration precautions particularly due to adequate but not ideal positioning. No overt s/s of dysphagia or aspiration noted with sips of water via straw, but pt declined any solid foods. He fairly quickly wanted to be returned to a more reclined position. Recommend continuing current diet with full supervision.    HPI HPI: Pt is an 81 y/o M presented from SNF for decreased PO Intake, swelling and redness of the left foot.  Recent admit 9/15 after fall at home with C54f, continues to wear C-collar. PMH includes alcohol abuse, ESRD on HD MWF, CHF, HTN, and HLD.      SLP Plan  Continue with current plan of care      Recommendations for follow up therapy are one component of a multi-disciplinary discharge planning process, led by the attending physician.  Recommendations may be updated based on patient status, additional functional criteria and insurance authorization.    Recommendations  Diet recommendations: Dysphagia 3 (mechanical soft);Thin liquid Liquids provided via: Cup;Straw Medication Administration: Whole meds with liquid Supervision: Full supervision/cueing for compensatory strategies;Staff to assist with self feeding Compensations: Slow rate;Small sips/bites;Minimize environmental distractions Postural Changes and/or Swallow Maneuvers: Seated upright 90 degrees                 Oral Care Recommendations: Oral care BID;Staff/trained caregiver to provide oral care Follow Up Recommendations: Skilled nursing-short term rehab (<3 hours/day) Assistance recommended at discharge: Frequent or constant Supervision/Assistance SLP Visit Diagnosis: Dysphagia, unspecified (R13.10) Plan: Continue with current plan of care           LOsie Bond, M.A. CThousand Island ParkOffice [phone] Secure chat preferred   02/28/2022, 1:54 PM

## 2022-02-28 NOTE — Progress Notes (Signed)
[facility] for Infectious Disease  Date of Admission:  02/21/2022     Total days of antibiotics 8         ASSESSMENT:  Mr. Compean blood cultures from 02/25/22 have remained without growth indicating clearance of bacteremia and will continue to monitor until finalized. Continues to have lower back pain with MRI imaging not able to rule out possibility of osteomyelitis/discitis vs degeneration in the setting of motion degradation. Unable to complete TEE secondary to C1 fracture. Neither of these will change the plan of care. Plan for at least 6 weeks of IV Cefazolin with dialysis which will place end date at 04/08/22. Check inflammatory markers for baseline today. Remaining medical and supportive care per primary team.   PLAN:  Continue current dose of Cefazolin.  End date of treatment 04/08/22 with dialysis.  Check inflammatory markers for baseline. Will arrange follow up in ID clinic.  Remaining medical and supportive care per primary team.   Principal Problem:   Sepsis ([facility]) Active Problems:   Hypertension   Hyperlipidemia   GERD (gastroesophageal reflux disease)   Paroxysmal atrial fibrillation (HCC)   Debility   Diarrhea   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HCC)   CAD (coronary artery disease)   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HCC)   Acute hyponatremia   Elevated troponin   Cellulitis   Prolonged QT interval   CAP (community acquired pneumonia)   Staphylococcus aureus bacteremia   MSSA bacteremia   Vertebral osteomyelitis (HCC)    amiodarone  200 mg Oral q morning   carvedilol  3.125 mg Oral BID WC   Chlorhexidine Gluconate Cloth  6 each Topical Q0600   darbepoetin (ARANESP) injection - DIALYSIS  200 mcg Intravenous Q Thu-HD   enoxaparin (LOVENOX) injection  40 mg Subcutaneous Daily   escitalopram  10 mg Oral q morning   feeding supplement  237 mL Oral BID BM   lidocaine  3 patch Transdermal Q24H   sodium chloride flush  3 mL Intravenous Q12H    tamsulosin  0.4 mg Oral QHS    SUBJECTIVE:  Afebrile overnight with no acute events. Continues to have lower back pain. Working with therapy during visit.   Allergies  Allergen Reactions   Penicillin G Sodium Rash and Other (See Comments)     (tolerates Keflex)  NOT DOCUMENTED ON THE MAR   Penicillins Rash    NOT DOCUMENTED ON THE MAR     Review of Systems: Review of Systems  Constitutional:  Negative for chills, fever and weight loss.  Respiratory:  Negative for cough, shortness of breath and wheezing.   Cardiovascular:  Negative for chest pain and leg swelling.  Gastrointestinal:  Negative for abdominal pain, constipation, diarrhea, nausea and vomiting.  Musculoskeletal:  Positive for back pain.  Skin:  Negative for rash.      OBJECTIVE: Vitals:   02/27/22 1649 02/27/22 1949 02/28/22 0400 02/28/22 0700  BP: (!) 107/54 (!) 113/55 112/69 124/68  Pulse: 64 66 (!) 58 61  Resp: '18 18 18 18  '$ Temp: 98.4 F (36.9 C) 98.1 F (36.7 C) 98.4 F (36.9 C) 97.8 F (36.6 C)  TempSrc: Oral Oral Oral Oral  SpO2: 93% 97% 99% 100%  Weight:      Height:       Body mass index is 23.19 kg/m.  Physical Exam Constitutional:      General: He is not in acute distress.    Appearance: He is well-developed.  Interventions: Cervical collar in place.     Comments: Lying in right decubitus; appears uncomfortable.   Cardiovascular:     Rate and Rhythm: Normal rate and regular rhythm.     Heart sounds: Normal heart sounds.  Pulmonary:     Effort: Pulmonary effort is normal.     Breath sounds: Normal breath sounds.  Skin:    General: Skin is warm and dry.  Neurological:     Mental Status: He is alert and oriented to person, place, and time.  Psychiatric:        Behavior: Behavior normal.        Thought Content: Thought content normal.        Judgment: Judgment normal.     Lab Results Lab Results  Component Value Date   WBC 5.5 02/25/2022   HGB 7.5 (L) 02/25/2022   HCT  22.1 (L) 02/25/2022   MCV 101.8 (H) 02/25/2022   PLT 60 (L) 02/25/2022    Lab Results  Component Value Date   CREATININE 2.80 (H) 02/28/2022   BUN 31 (H) 02/28/2022   NA 134 (L) 02/28/2022   K 4.1 02/28/2022   CL 98 02/28/2022   CO2 28 02/28/2022    Lab Results  Component Value Date   ALT 19 02/22/2022   AST 37 02/22/2022   ALKPHOS 62 02/22/2022   BILITOT 1.6 (H) 02/22/2022     Microbiology: Recent Results (from the past 240 hour(s))  SARS Coronavirus 2 by RT PCR (hospital order, performed in [facility] hospital lab) *cepheid single result test* Anterior Nasal Swab     Status: None   Collection Time: 02/21/22  3:16 AM   Specimen: Anterior Nasal Swab  Result Value Ref Range Status   SARS Coronavirus 2 by RT PCR NEGATIVE NEGATIVE Final    Comment: (NOTE) SARS-CoV-2 target nucleic acids are NOT DETECTED.  The SARS-CoV-2 RNA is generally detectable in upper and lower respiratory specimens during the acute phase of infection. The lowest concentration of SARS-CoV-2 viral copies this assay can detect is 250 copies / mL. A negative result does not preclude SARS-CoV-2 infection and should not be used as the sole basis for treatment or other patient management decisions.  A negative result may occur with improper specimen collection / handling, submission of specimen other than nasopharyngeal swab, presence of viral mutation(s) within the areas targeted by this assay, and inadequate number of viral copies (<250 copies / mL). A negative result must be combined with clinical observations, patient history, and epidemiological information.  Fact Sheet for Patients:   https://www.patel.info/  Fact Sheet for Healthcare Providers: https://hall.com/  This test is not yet approved or  cleared by the [place] FDA and has been authorized for detection and/or diagnosis of SARS-CoV-2 by FDA under an Emergency Use Authorization (EUA).  This  EUA will remain in effect (meaning this test can be used) for the duration of the COVID-19 declaration under Section 564(b)(1) of the Act, 21 U.S.C. section 360bbb-3(b)(1), unless the authorization is terminated or revoked sooner.  Performed at [facility], [place] [address] [place], [place] [address]   Blood culture (routine x 2)     Status: Abnormal   Collection Time: 02/21/22  4:07 AM   Specimen: BLOOD RIGHT HAND  Result Value Ref Range Status   Specimen Description BLOOD RIGHT HAND  Final   Special Requests AEROBIC BOTTLE ONLY Blood Culture adequate volume  Final   Culture  Setup Time   Final  GRAM POSITIVE COCCI IN CLUSTERS AEROBIC BOTTLE ONLY CRITICAL RESULT CALLED TO, READ BACK BY AND VERIFIED WITH: [place] ON 02/21/22 @ 2022 BY DRT Performed at [facility], [place] [address] [place], [place] [address]    Culture STAPHYLOCOCCUS AUREUS (A)  Final   Report Status 02/23/2022 FINAL  Final   Organism ID, Bacteria STAPHYLOCOCCUS AUREUS  Final      Susceptibility   Staphylococcus aureus - MIC*    CIPROFLOXACIN <=0.5 SENSITIVE Sensitive     ERYTHROMYCIN >=8 RESISTANT Resistant     GENTAMICIN <=0.5 SENSITIVE Sensitive     OXACILLIN <=0.25 SENSITIVE Sensitive     TETRACYCLINE >=16 RESISTANT Resistant     VANCOMYCIN <=0.5 SENSITIVE Sensitive     TRIMETH/SULFA <=10 SENSITIVE Sensitive     CLINDAMYCIN RESISTANT Resistant     RIFAMPIN <=0.5 SENSITIVE Sensitive     Inducible Clindamycin POSITIVE Resistant     * STAPHYLOCOCCUS AUREUS  Blood Culture ID Panel (Reflexed)     Status: Abnormal   Collection Time: 02/21/22  4:07 AM  Result Value Ref Range Status   Enterococcus faecalis NOT DETECTED NOT DETECTED Final   Enterococcus Faecium NOT DETECTED NOT DETECTED Final   Listeria monocytogenes NOT DETECTED NOT DETECTED Final   Staphylococcus species DETECTED (A) NOT DETECTED Final    Comment: CRITICAL RESULT CALLED TO, READ BACK BY AND VERIFIED WITH: PHARM CATHY  PEARCE ON 02/21/22 @ 2022 BY DRT    Staphylococcus aureus (BCID) DETECTED (A) NOT DETECTED Final    Comment: CRITICAL RESULT CALLED TO, READ BACK BY AND VERIFIED WITH: PHARM CATHY PEARCE ON 02/21/22 @ 2022 BY DRT    Staphylococcus epidermidis NOT DETECTED NOT DETECTED Final   Staphylococcus lugdunensis NOT DETECTED NOT DETECTED Final   Streptococcus species NOT DETECTED NOT DETECTED Final   Streptococcus agalactiae NOT DETECTED NOT DETECTED Final   Streptococcus pneumoniae NOT DETECTED NOT DETECTED Final   Streptococcus pyogenes NOT DETECTED NOT DETECTED Final   A.calcoaceticus-baumannii NOT DETECTED NOT DETECTED Final   Bacteroides fragilis NOT DETECTED NOT DETECTED Final   Enterobacterales NOT DETECTED NOT DETECTED Final   Enterobacter cloacae complex NOT DETECTED NOT DETECTED Final   Escherichia coli NOT DETECTED NOT DETECTED Final   Klebsiella aerogenes NOT DETECTED NOT DETECTED Final   Klebsiella oxytoca NOT DETECTED NOT DETECTED Final   Klebsiella pneumoniae NOT DETECTED NOT DETECTED Final   Proteus species NOT DETECTED NOT DETECTED Final   Salmonella species NOT DETECTED NOT DETECTED Final   Serratia marcescens NOT DETECTED NOT DETECTED Final   Haemophilus influenzae NOT DETECTED NOT DETECTED Final   Neisseria meningitidis NOT DETECTED NOT DETECTED Final   Pseudomonas aeruginosa NOT DETECTED NOT DETECTED Final   Stenotrophomonas maltophilia NOT DETECTED NOT DETECTED Final   Candida albicans NOT DETECTED NOT DETECTED Final   Candida auris NOT DETECTED NOT DETECTED Final   Candida glabrata NOT DETECTED NOT DETECTED Final   Candida krusei NOT DETECTED NOT DETECTED Final   Candida parapsilosis NOT DETECTED NOT DETECTED Final   Candida tropicalis NOT DETECTED NOT DETECTED Final   Cryptococcus neoformans/gattii NOT DETECTED NOT DETECTED Final   Meth resistant mecA/C and MREJ NOT DETECTED NOT DETECTED Final    Comment: Performed at [facility], [address],  [place], [place] [address]  Blood culture (routine x 2)     Status: Abnormal   Collection Time: 02/21/22  6:19 AM   Specimen: BLOOD LEFT HAND  Result Value Ref Range Status   Specimen  Description BLOOD LEFT HAND  Final   Special Requests AEROBIC BOTTLE ONLY Blood Culture adequate volume  Final   Culture  Setup Time   Final    GRAM POSITIVE COCCI IN CLUSTERS AEROBIC BOTTLE ONLY CRITICAL VALUE NOTED.  VALUE IS CONSISTENT WITH PREVIOUSLY REPORTED AND CALLED VALUE.    Culture (A)  Final    STAPHYLOCOCCUS AUREUS SUSCEPTIBILITIES PERFORMED ON PREVIOUS CULTURE WITHIN THE LAST 5 DAYS. Performed at [facility], [place] [address], [place], [place] [address]    Report Status 02/23/2022 FINAL  Final  Blood Culture (routine x 2)     Status: Abnormal   Collection Time: 02/21/22  8:42 PM   Specimen: BLOOD RIGHT FOREARM  Result Value Ref Range Status   Specimen Description BLOOD RIGHT FOREARM  Final   Special Requests   Final    BOTTLES DRAWN AEROBIC AND ANAEROBIC Blood Culture adequate volume   Culture  Setup Time   Final    GRAM POSITIVE COCCI IN CLUSTERS IN BOTH AEROBIC AND ANAEROBIC BOTTLES CRITICAL VALUE NOTED.  VALUE IS CONSISTENT WITH PREVIOUSLY REPORTED AND CALLED VALUE.    Culture (A)  Final    STAPHYLOCOCCUS AUREUS SUSCEPTIBILITIES PERFORMED ON PREVIOUS CULTURE WITHIN THE LAST 5 DAYS. Performed at [facility], [place] [address], [place], [place] [address]    Report Status 02/24/2022 FINAL  Final  Blood Culture (routine x 2)     Status: Abnormal   Collection Time: 02/21/22  8:49 PM   Specimen: BLOOD  Result Value Ref Range Status   Specimen Description BLOOD RIGHT ANTECUBITAL  Final   Special Requests   Final    BOTTLES DRAWN AEROBIC AND ANAEROBIC Blood Culture adequate volume   Culture  Setup Time   Final    GRAM POSITIVE COCCI IN CLUSTERS IN BOTH AEROBIC AND ANAEROBIC BOTTLES CRITICAL RESULT CALLED TO, READ BACK BY AND VERIFIED WITH: PHARMD E [place] [id number] AT 1234 BY  CM    Culture (A)  Final    STAPHYLOCOCCUS AUREUS SUSCEPTIBILITIES PERFORMED ON PREVIOUS CULTURE WITHIN THE LAST 5 DAYS. Performed at [facility], [place] [address] [place], [place] [address]    Report Status 02/24/2022 FINAL  Final  Urine Culture     Status: Abnormal   Collection Time: 02/22/22  3:28 AM   Specimen: Urine, Clean Catch  Result Value Ref Range Status   Specimen Description URINE, CLEAN CATCH  Final   Special Requests NONE  Final   Culture (A)  Final    <10,000 COLONIES/mL INSIGNIFICANT GROWTH Performed at [facility], [place] [address], [place], [place] [address]    Report Status 02/23/2022 FINAL  Final  Resp Panel by RT-PCR (Flu A&B, Covid) Anterior Nasal Swab     Status: None   Collection Time: 02/22/22  3:28 AM   Specimen: Anterior Nasal Swab  Result Value Ref Range Status   SARS Coronavirus 2 by RT PCR NEGATIVE NEGATIVE Final    Comment: (NOTE) SARS-CoV-2 target nucleic acids are NOT DETECTED.  The SARS-CoV-2 RNA is generally detectable in upper respiratory specimens during the acute phase of infection. The lowest concentration of SARS-CoV-2 viral copies this assay can detect is 138 copies/mL. A negative result does not preclude SARS-Cov-2 infection and should not be used as the sole basis for treatment or other patient management decisions. A negative result may occur with  improper specimen collection/handling, submission of specimen other than nasopharyngeal swab, presence of viral mutation(s) within the areas targeted  by this assay, and inadequate number of viral copies(<138 copies/mL). A negative result must be combined with clinical observations, patient history, and epidemiological information. The expected result is Negative.  Fact Sheet for Patients:  EntrepreneurPulse.com.au  Fact Sheet for Healthcare Providers:  IncredibleEmployment.be  This test is no t yet approved or cleared by the [place] FDA and  has been authorized for detection and/or diagnosis of SARS-CoV-2 by FDA under an Emergency Use Authorization (EUA). This EUA will remain  in effect (meaning this test can be used) for the duration of the COVID-19 declaration under Section 564(b)(1) of the Act, 21 U.S.C.section 360bbb-3(b)(1), unless the authorization is terminated  or revoked sooner.       Influenza A by PCR NEGATIVE NEGATIVE Final   Influenza B by PCR NEGATIVE NEGATIVE Final    Comment: (NOTE) The Xpert Xpress SARS-CoV-2/FLU/RSV plus assay is intended as an aid in the diagnosis of influenza from Nasopharyngeal swab specimens and should not be used as a sole basis for treatment. Nasal washings and aspirates are unacceptable for Xpert Xpress SARS-CoV-2/FLU/RSV testing.  Fact Sheet for Patients: EntrepreneurPulse.com.au  Fact Sheet for Healthcare Providers: IncredibleEmployment.be  This test is not yet approved or cleared by the [place] FDA and has been authorized for detection and/or diagnosis of SARS-CoV-2 by FDA under an Emergency Use Authorization (EUA). This EUA will remain in effect (meaning this test can be used) for the duration of the COVID-19 declaration under Section 564(b)(1) of the Act, 21 U.S.C. section 360bbb-3(b)(1), unless the authorization is terminated or revoked.  Performed at [facility], [place] [address] [place], [place] [address]   Respiratory (~20 pathogens) panel by PCR     Status: None   Collection Time: 02/22/22  3:28 AM   Specimen: Urine, Clean Catch; Respiratory  Result Value Ref Range Status   Adenovirus NOT DETECTED NOT DETECTED Final   Coronavirus 229E NOT DETECTED NOT DETECTED Final    Comment: (NOTE) The Coronavirus on the Respiratory Panel, DOES NOT test for the novel  Coronavirus (2019 nCoV)    Coronavirus HKU1 NOT DETECTED NOT DETECTED Final   Coronavirus NL63 NOT DETECTED NOT DETECTED Final   Coronavirus  OC43 NOT DETECTED NOT DETECTED Final   Metapneumovirus NOT DETECTED NOT DETECTED Final   Rhinovirus / Enterovirus NOT DETECTED NOT DETECTED Final   Influenza A NOT DETECTED NOT DETECTED Final   Influenza B NOT DETECTED NOT DETECTED Final   Parainfluenza Virus 1 NOT DETECTED NOT DETECTED Final   Parainfluenza Virus 2 NOT DETECTED NOT DETECTED Final   Parainfluenza Virus 3 NOT DETECTED NOT DETECTED Final   Parainfluenza Virus 4 NOT DETECTED NOT DETECTED Final   Respiratory Syncytial Virus NOT DETECTED NOT DETECTED Final   Bordetella pertussis NOT DETECTED NOT DETECTED Final   Bordetella Parapertussis NOT DETECTED NOT DETECTED Final   Chlamydophila pneumoniae NOT DETECTED NOT DETECTED Final   Mycoplasma pneumoniae NOT DETECTED NOT DETECTED Final    Comment: Performed at [facility], [place] [address] [place], [place] [address]  MRSA Next Gen by PCR, Nasal     Status: None   Collection Time: 02/22/22  3:33 AM   Specimen: STOOL; Nasal Swab  Result Value Ref Range Status   MRSA by PCR Next Gen NOT DETECTED NOT DETECTED Final    Comment: (NOTE) The GeneXpert MRSA Assay (FDA approved for NASAL specimens only), is one component of a comprehensive MRSA colonization surveillance program. It is not intended to diagnose MRSA infection  nor to guide or monitor treatment for MRSA infections. Test performance is not FDA approved in patients less than 64 years old. Performed at [facility], [place] [address], [place], [place] [address]   Culture, blood (Routine X 2) w Reflex to ID Panel     Status: Abnormal   Collection Time: 02/23/22  2:43 AM   Specimen: BLOOD  Result Value Ref Range Status   Specimen Description BLOOD SITE NOT SPECIFIED  Final   Special Requests   Final    BOTTLES DRAWN AEROBIC AND ANAEROBIC Blood Culture results may not be optimal due to an inadequate volume of blood received in culture bottles   Culture  Setup Time   Final    GRAM POSITIVE COCCI IN CLUSTERS AEROBIC  BOTTLE ONLY CRITICAL RESULT CALLED TO, READ BACK BY AND VERIFIED WITH: PHARMD MICHAEL [place] ON 02/23/22 @ 2027 BY DRT    Culture (A)  Final    STAPHYLOCOCCUS AUREUS SUSCEPTIBILITIES PERFORMED ON PREVIOUS CULTURE WITHIN THE LAST 5 DAYS. Performed at [facility], [place] [address] [place], [place] [address]    Report Status 02/24/2022 FINAL  Final  Culture, blood (Routine X 2) w Reflex to ID Panel     Status: None   Collection Time: 02/23/22  2:43 AM   Specimen: BLOOD  Result Value Ref Range Status   Specimen Description BLOOD SITE NOT SPECIFIED  Final   Special Requests   Final    BOTTLES DRAWN AEROBIC AND ANAEROBIC Blood Culture adequate volume   Culture   Final    NO GROWTH 5 DAYS Performed at [facility], [place] [address] [place], [place] [address]    Report Status 02/28/2022 FINAL  Final  Culture, blood (Routine X 2) w Reflex to ID Panel     Status: None (Preliminary result)   Collection Time: 02/25/22  8:23 AM   Specimen: BLOOD RIGHT ARM  Result Value Ref Range Status   Specimen Description BLOOD RIGHT ARM  Final   Special Requests   Final    BOTTLES DRAWN AEROBIC AND ANAEROBIC Blood Culture adequate volume   Culture   Final    NO GROWTH 3 DAYS Performed at [facility], [place] [address], [place], [place] [address]    Report Status PENDING  Incomplete  Culture, blood (Routine X 2) w Reflex to ID Panel     Status: None (Preliminary result)   Collection Time: 02/25/22  8:30 AM   Specimen: BLOOD  Result Value Ref Range Status   Specimen Description BLOOD RIGHT ANTECUBITAL  Final   Special Requests   Final    BOTTLES DRAWN AEROBIC AND ANAEROBIC Blood Culture adequate volume   Culture   Final    NO GROWTH 3 DAYS Performed at [facility], [place] [address] [place],  [address]    Report Status PENDING  Incomplete     Terri Piedra, [facility]  02/28/2022  12:11 PM

## 2022-03-01 DIAGNOSIS — R6520 Severe sepsis without septic shock: Secondary | ICD-10-CM | POA: Diagnosis not present

## 2022-03-01 DIAGNOSIS — A419 Sepsis, unspecified organism: Secondary | ICD-10-CM | POA: Diagnosis not present

## 2022-03-01 DIAGNOSIS — I2510 Atherosclerotic heart disease of native coronary artery without angina pectoris: Secondary | ICD-10-CM | POA: Diagnosis not present

## 2022-03-01 LAB — RENAL FUNCTION PANEL
Albumin: 1.8 g/dL — ABNORMAL LOW (ref 3.5–5.0)
Anion gap: 11 (ref 5–15)
BUN: 39 mg/dL — ABNORMAL HIGH (ref 8–23)
CO2: 25 mmol/L (ref 22–32)
Calcium: 7.7 mg/dL — ABNORMAL LOW (ref 8.9–10.3)
Chloride: 98 mmol/L (ref 98–111)
Creatinine, Ser: 3.18 mg/dL — ABNORMAL HIGH (ref 0.61–1.24)
GFR, Estimated: 19 mL/min — ABNORMAL LOW (ref >60)
Glucose, Bld: 99 mg/dL (ref 70–99)
Phosphorus: 4.2 mg/dL (ref 2.5–4.6)
Potassium: 4.1 mmol/L (ref 3.5–5.1)
Sodium: 134 mmol/L — ABNORMAL LOW (ref 135–145)

## 2022-03-01 LAB — CBC
HCT: 22.9 % — ABNORMAL LOW (ref 39.0–52.0)
Hemoglobin: 7.7 g/dL — ABNORMAL LOW (ref 13.0–17.0)
MCH: 34.4 pg — ABNORMAL HIGH (ref 26.0–34.0)
MCHC: 33.6 g/dL (ref 30.0–36.0)
MCV: 102.2 fL — ABNORMAL HIGH (ref 80.0–100.0)
Platelets: 171 10*3/uL (ref 150–400)
RBC: 2.24 MIL/uL — ABNORMAL LOW (ref 4.22–5.81)
RDW: 15.1 % (ref 11.5–15.5)
WBC: 7.9 10*3/uL (ref 4.0–10.5)
nRBC: 0 % (ref 0.0–0.2)

## 2022-03-01 NOTE — Procedures (Signed)
Seen and examined on dialysis.  Blood pressure 109/55 and HR 58. Procedure supervised.  Left AF in use.  Tolerating goal.  Behaving fine  Claudia Desanctis, MD 03/01/2022  9:58 AM

## 2022-03-01 NOTE — Progress Notes (Signed)
[facility] PROGRESS NOTE    Progress Note  Darryl Diaz  OXB:[id number] DOB: 1940-10-14 DOA: 02/21/2022 PCP: [name], MD     Brief Narrative:   Darryl Diaz is an 81 y.o. 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 Blood cultures repeated on 02/25/2022 are negative till date. MRI of the C-spine showed discitis and osteomyelitis with possible abscess C6-C7 ID was consulted now has been transitioned to IV cefazolin.   Has remained afebrile. TEE cannot be performed due to his C1 articular facet fracture. Occupational Therapy and physical therapy evaluated the patient, he will need to go to skilled nursing facility.  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  Hypokalemia: Nephrology has been consulted, further management per hemodialysis.  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 Hemoglobin has remained relatively stable at 7.5-7.6.  C1 articular facet fracture: Currently in a c-collar. Cardiology will not proceed with TEE  Elevated troponins: In the setting of  sepsis and end-stage renal disease, likely demand ischemia he denies any chest pain twelve-lead EKG showed no signs of ischemia.  Mild aortic stenosis: Mean gradient of 12 continue to monitor as an outpatient.  Mild hyponatremia: Further management per renal with dialysis. Now resolved.  Diarrhea: Has had no further diarrhea in house.  Paroxysmal atrial fibrillation: On anticoagulation rate control continue amiodarone.  Essential hypertension: Blood pressures well controlled continue current regimen.  Hyperlipidemia: Continue statins.  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  Prolonged QTc: Noted continue to monitor on telemetry.    DVT prophylaxis: lovenox Family Communication:none Status is: Inpatient Remains inpatient appropriate because: Likely back to SNF after infectious work-up and treatment has been completed.    Code Status:     Code Status Orders  (From admission, onward)           Start     Ordered   02/22/22 0243  Do not attempt resuscitation (DNR)  Continuous       Question Answer Comment  In the event of cardiac or respiratory ARREST Do not call a "code blue"   In the event of cardiac or respiratory ARREST Do not perform Intubation, CPR, defibrillation or ACLS   In the event of cardiac or respiratory ARREST Use medication by any route, position, wound care, and other measures to relive pain and suffering. May use oxygen, suction and manual treatment of airway obstruction as needed for comfort.      02/22/22 0242           Code Status History     Date Active Date Inactive Code Status Order ID Comments User Context   01/21/2022  0603 01/27/2022 1811 DNR [id number]  Shela Leff, MD ED   11/24/2021 0007 11/26/2021 2014 DNR [id number]  Rhetta Mura, DO ED   09/27/2021 2202 09/30/2021 2305 DNR [id number]  Rhetta Mura, DO ED   06/07/2021 2050 06/14/2021 2022 DNR  [id number]  Rhetta Mura, DO ED   05/02/2021 0353 05/02/2021 2111 DNR [id number]  Elwyn Reach, MD Inpatient   01/17/2021 0623 01/22/2021 2354 DNR [id number]  Reubin Milan, MD ED   01/06/2021 2258 01/09/2021 2208 DNR [id number]  Lenore Cordia, MD ED   12/02/2020 1537 12/04/2020 1641 Full Code [id number]  Karmen Bongo, MD ED   10/13/2020 2222 10/17/2020 2016 Full Code [id number]  Chotiner, Yevonne Aline, MD Inpatient   09/07/2020 2215 09/13/2020 2142 Full Code [id number]  Lenore Cordia, MD ED   03/16/2020 0811 03/20/2020 1908 Full Code [id number]  Karmen Bongo, MD ED   03/06/2020 0100 03/11/2020 1501 Full Code [id number]  Cathlyn Parsons, PA-C Inpatient   03/06/2020 0100 03/06/2020 0100 Full Code [id number]  Cathlyn Parsons, PA-C Inpatient   02/22/2020 1704 03/06/2020 0029 Full Code [id number]  Freada Bergeron, MD ED   02/05/2020 2210 02/08/2020 0218 Full Code [id number]  Barrett, Evelene Croon, PA-C ED   10/15/2019 1204 10/16/2019 1922 Full Code [id number]  Norman Herrlich Inpatient   04/20/2019 1844 04/23/2019 1723 Full Code [id number]  Donita Brooks, NP ED   02/25/2019 2021 03/05/2019 1432 Full Code [id number]  Shela Leff, MD ED   08/11/2018 1707 08/12/2018 1446 Full Code [id number]  Barb Merino, MD Inpatient   04/30/2016 0253 05/04/2016 1828 Full Code [id number]  Wylene Simmer, MD Inpatient   03/30/2016 1347 04/04/2016 1440 Full Code [id number]  Rondel Jumbo PA-C ED   03/30/2016 1347 03/30/2016 1347 Full Code [id number]  Rondel Jumbo, PA-C ED         IV Access:   Peripheral IV   Procedures and diagnostic studies:   No results found.   Medical Consultants:   None.   Subjective:    Darryl Diaz no complaints today.  Objective:    Vitals:   03/01/22 0930 03/01/22 1000 03/01/22 1030 03/01/22 1100  BP: (!) 109/55 108/79 (!) 108/52 (!) 138/57  Pulse: (!) 58 (!) 59 60 70  Resp: 18 14 (!) 21 15  Temp:      TempSrc:      SpO2: 97% 99% 100% 95%  Weight:       Height:       SpO2: 95 %   Intake/Output Summary (Last 24 hours) at 03/01/2022 1117 Last data filed at 02/28/2022 2018 Gross per 24 hour  Intake 50 ml  Output 550 ml  Net -500 ml    Filed Weights   02/21/22 1959  Weight: 77.6 kg    Exam: General exam: In no acute distress. Respiratory system: Good air movement and clear to auscultation. Cardiovascular system: S1 & S2 heard, RRR. No JVD. Gastrointestinal system: Abdomen is nondistended, soft and nontender.  Extremities: No pedal edema. Skin: No rashes, lesions or ulcers Psychiatry: Judgement and insight appear normal. Mood & affect appropriate.  Data Reviewed:    Labs: Basic Metabolic Panel: Recent Labs  Lab 02/25/22 0322 02/26/22 1011 02/27/22 0400 02/28/22 0425 03/01/22 0514  NA 136 135 139 134* 134*  K 3.4* 3.3* 3.8 4.1 4.1  CL 97* 99 102 98 98  CO2 '28 28 30 28 25  '$ GLUCOSE 99 121* 104* 100* 99  BUN 22 38* 19  31* 39*  CREATININE 2.03* 3.03* 2.08* 2.80* 3.18*  CALCIUM 7.9* 7.6* 7.8* 7.9* 7.7*  MG  --  1.7  --   --   --   PHOS 1.4* 2.6 2.4* 3.9 4.2    GFR Estimated Creatinine Clearance: 20.3 mL/min (A) (by C-G formula based on SCr of 3.18 mg/dL (H)). Liver Function Tests: Recent Labs  Lab 02/25/22 0322 02/26/22 1011 02/27/22 0400 02/28/22 0425 03/01/22 0514  ALBUMIN 1.9* 1.9* 1.8* 1.8* 1.8*    No results for input(s): "LIPASE", "AMYLASE" in the last 168 hours. No results for input(s): "AMMONIA" in the last 168 hours.  Coagulation profile No results for input(s): "INR", "PROTIME" in the last 168 hours.  COVID-19 Labs  No results for input(s): "DDIMER", "FERRITIN", "LDH", "CRP" in the last 72 hours.   Lab Results  Component Value Date   SARSCOV2NAA NEGATIVE 02/22/2022   SARSCOV2NAA NEGATIVE 02/21/2022   SARSCOV2NAA NEGATIVE 12/20/2021   SARSCOV2NAA POSITIVE (A) 06/03/2021    CBC: Recent Labs  Lab 02/23/22 0243 02/25/22 0322 03/01/22 0514  WBC 7.9 5.5 7.9  HGB 7.6* 7.5* 7.7*   HCT 22.6* 22.1* 22.9*  MCV 104.1* 101.8* 102.2*  PLT 73* 60* 171    Cardiac Enzymes: No results for input(s): "CKTOTAL", "CKMB", "CKMBINDEX", "TROPONINI" in the last 168 hours.  BNP (last 3 results) No results for input(s): "PROBNP" in the last 8760 hours. CBG: Recent Labs  Lab 02/23/22 2345 02/25/22 0824 02/25/22 1200 02/27/22 1201 02/27/22 1648  GLUCAP 136* 106* 139* 123* 115*    D-Dimer: No results for input(s): "DDIMER" in the last 72 hours. Hgb A1c: No results for input(s): "HGBA1C" in the last 72 hours. Lipid Profile: No results for input(s): "CHOL", "HDL", "LDLCALC", "TRIG", "CHOLHDL", "LDLDIRECT" in the last 72 hours. Thyroid function studies: No results for input(s): "TSH", "T4TOTAL", "T3FREE", "THYROIDAB" in the last 72 hours.  Invalid input(s): "FREET3"  Anemia work up: No results for input(s): "VITAMINB12", "FOLATE", "FERRITIN", "TIBC", "IRON", "RETICCTPCT" in the last 72 hours.  Sepsis Labs: Recent Labs  Lab 02/23/22 0243 02/25/22 0322 03/01/22 0514  PROCALCITON 10.32  --   --   WBC 7.9 5.5 7.9    Microbiology Recent Results (from the past 240 hour(s))  SARS Coronavirus 2 by RT PCR (hospital order, performed in [facility] hospital lab) *cepheid single result test* Anterior Nasal Swab     Status: None   Collection Time: 02/21/22  3:16 AM   Specimen: Anterior Nasal Swab  Result Value Ref Range Status   SARS Coronavirus 2 by RT PCR NEGATIVE NEGATIVE Final    Comment: (NOTE) SARS-CoV-2 target nucleic acids are NOT DETECTED.  The SARS-CoV-2 RNA is generally detectable in upper and lower respiratory specimens during the acute phase of infection. The lowest concentration of SARS-CoV-2 viral copies this assay can detect is 250 copies / mL. A negative result does not preclude SARS-CoV-2 infection and should not be used as the sole basis for treatment or other patient management decisions.  A negative result may occur with improper specimen  collection / handling, submission of specimen other than nasopharyngeal swab, presence of viral mutation(s) within the areas targeted by this assay, and inadequate number of viral copies (<250 copies / mL). A negative result must be combined with clinical observations, patient history, and epidemiological information.  Fact Sheet for Patients:   https://www.patel.info/  Fact Sheet for Healthcare Providers: https://hall.com/  This test is not yet approved or  cleared by the [place] and has been authorized  for detection and/or diagnosis of SARS-CoV-2 by FDA under an Emergency Use Authorization (EUA).  This EUA will remain in effect (meaning this test can be used) for the duration of the COVID-19 declaration under Section 564(b)(1) of the Act, 21 U.S.C. section 360bbb-3(b)(1), unless the authorization is terminated or revoked sooner.  Performed at [facility], [place] [address], [place], [place] [address]   Blood culture (routine x 2)     Status: Abnormal   Collection Time: 02/21/22  4:07 AM   Specimen: BLOOD RIGHT HAND  Result Value Ref Range Status   Specimen Description BLOOD RIGHT HAND  Final   Special Requests AEROBIC BOTTLE ONLY Blood Culture adequate volume  Final   Culture  Setup Time   Final    GRAM POSITIVE COCCI IN CLUSTERS AEROBIC BOTTLE ONLY CRITICAL RESULT CALLED TO, READ BACK BY AND VERIFIED WITH: Hugo ON 02/21/22 @ 2022 BY DRT Performed at [facility], [place] [address], [place], [place] [address]    Culture STAPHYLOCOCCUS AUREUS (A)  Final   Report Status 02/23/2022 FINAL  Final   Organism ID, Bacteria STAPHYLOCOCCUS AUREUS  Final      Susceptibility   Staphylococcus aureus - MIC*    CIPROFLOXACIN <=0.5 SENSITIVE Sensitive     ERYTHROMYCIN >=8 RESISTANT Resistant     GENTAMICIN <=0.5 SENSITIVE Sensitive     OXACILLIN <=0.25 SENSITIVE Sensitive     TETRACYCLINE >=16 RESISTANT Resistant      VANCOMYCIN <=0.5 SENSITIVE Sensitive     TRIMETH/SULFA <=10 SENSITIVE Sensitive     CLINDAMYCIN RESISTANT Resistant     RIFAMPIN <=0.5 SENSITIVE Sensitive     Inducible Clindamycin POSITIVE Resistant     * STAPHYLOCOCCUS AUREUS  Blood Culture ID Panel (Reflexed)     Status: Abnormal   Collection Time: 02/21/22  4:07 AM  Result Value Ref Range Status   Enterococcus faecalis NOT DETECTED NOT DETECTED Final   Enterococcus Faecium NOT DETECTED NOT DETECTED Final   Listeria monocytogenes NOT DETECTED NOT DETECTED Final   Staphylococcus species DETECTED (A) NOT DETECTED Final    Comment: CRITICAL RESULT CALLED TO, READ BACK BY AND VERIFIED WITH: PHARM CATHY PEARCE ON 02/21/22 @ 2022 BY DRT    Staphylococcus aureus (BCID) DETECTED (A) NOT DETECTED Final    Comment: CRITICAL RESULT CALLED TO, READ BACK BY AND VERIFIED WITH: PHARM CATHY PEARCE ON 02/21/22 @ 2022 BY DRT    Staphylococcus epidermidis NOT DETECTED NOT DETECTED Final   Staphylococcus lugdunensis NOT DETECTED NOT DETECTED Final   Streptococcus species NOT DETECTED NOT DETECTED Final   Streptococcus agalactiae NOT DETECTED NOT DETECTED Final   Streptococcus pneumoniae NOT DETECTED NOT DETECTED Final   Streptococcus pyogenes NOT DETECTED NOT DETECTED Final   A.calcoaceticus-baumannii NOT DETECTED NOT DETECTED Final   Bacteroides fragilis NOT DETECTED NOT DETECTED Final   Enterobacterales NOT DETECTED NOT DETECTED Final   Enterobacter cloacae complex NOT DETECTED NOT DETECTED Final   Escherichia coli NOT DETECTED NOT DETECTED Final   Klebsiella aerogenes NOT DETECTED NOT DETECTED Final   Klebsiella oxytoca NOT DETECTED NOT DETECTED Final   Klebsiella pneumoniae NOT DETECTED NOT DETECTED Final   Proteus species NOT DETECTED NOT DETECTED Final   Salmonella species NOT DETECTED NOT DETECTED Final   Serratia marcescens NOT DETECTED NOT DETECTED Final   Haemophilus influenzae NOT DETECTED NOT DETECTED Final   Neisseria  meningitidis NOT DETECTED NOT DETECTED Final   Pseudomonas aeruginosa NOT DETECTED NOT DETECTED Final   Stenotrophomonas  maltophilia NOT DETECTED NOT DETECTED Final   Candida albicans NOT DETECTED NOT DETECTED Final   Candida auris NOT DETECTED NOT DETECTED Final   Candida glabrata NOT DETECTED NOT DETECTED Final   Candida krusei NOT DETECTED NOT DETECTED Final   Candida parapsilosis NOT DETECTED NOT DETECTED Final   Candida tropicalis NOT DETECTED NOT DETECTED Final   Cryptococcus neoformans/gattii NOT DETECTED NOT DETECTED Final   Meth resistant mecA/C and MREJ NOT DETECTED NOT DETECTED Final    Comment: Performed at [facility], [place] [address] [place], [place] [address]  Blood culture (routine x 2)     Status: Abnormal   Collection Time: 02/21/22  6:19 AM   Specimen: BLOOD LEFT HAND  Result Value Ref Range Status   Specimen Description BLOOD LEFT HAND  Final   Special Requests AEROBIC BOTTLE ONLY Blood Culture adequate volume  Final   Culture  Setup Time   Final    GRAM POSITIVE COCCI IN CLUSTERS AEROBIC BOTTLE ONLY CRITICAL VALUE NOTED.  VALUE IS CONSISTENT WITH PREVIOUSLY REPORTED AND CALLED VALUE.    Culture (A)  Final    STAPHYLOCOCCUS AUREUS SUSCEPTIBILITIES PERFORMED ON PREVIOUS CULTURE WITHIN THE LAST 5 DAYS. Performed at [facility], [place] [address] [place], [place] [address]    Report Status 02/23/2022 FINAL  Final  Blood Culture (routine x 2)     Status: Abnormal   Collection Time: 02/21/22  8:42 PM   Specimen: BLOOD RIGHT FOREARM  Result Value Ref Range Status   Specimen Description BLOOD RIGHT FOREARM  Final   Special Requests   Final    BOTTLES DRAWN AEROBIC AND ANAEROBIC Blood Culture adequate volume   Culture  Setup Time   Final    GRAM POSITIVE COCCI IN CLUSTERS IN BOTH AEROBIC AND ANAEROBIC BOTTLES CRITICAL VALUE NOTED.  VALUE IS CONSISTENT WITH PREVIOUSLY REPORTED AND CALLED VALUE.    Culture (A)  Final    STAPHYLOCOCCUS  AUREUS SUSCEPTIBILITIES PERFORMED ON PREVIOUS CULTURE WITHIN THE LAST 5 DAYS. Performed at [facility], [place] [address] [place], [place] [address]    Report Status 02/24/2022 FINAL  Final  Blood Culture (routine x 2)     Status: Abnormal   Collection Time: 02/21/22  8:49 PM   Specimen: BLOOD  Result Value Ref Range Status   Specimen Description BLOOD RIGHT ANTECUBITAL  Final   Special Requests   Final    BOTTLES DRAWN AEROBIC AND ANAEROBIC Blood Culture adequate volume   Culture  Setup Time   Final    GRAM POSITIVE COCCI IN CLUSTERS IN BOTH AEROBIC AND ANAEROBIC BOTTLES CRITICAL RESULT CALLED TO, READ BACK BY AND VERIFIED WITH: PHARMD E [place] [id number] AT 1234 BY CM    Culture (A)  Final    STAPHYLOCOCCUS AUREUS SUSCEPTIBILITIES PERFORMED ON PREVIOUS CULTURE WITHIN THE LAST 5 DAYS. Performed at [facility], [place] [address] [place], [place] [address]    Report Status 02/24/2022 FINAL  Final  Urine Culture     Status: Abnormal   Collection Time: 02/22/22  3:28 AM   Specimen: Urine, Clean Catch  Result Value Ref Range Status   Specimen Description URINE, CLEAN CATCH  Final   Special Requests NONE  Final   Culture (A)  Final    <10,000 COLONIES/mL INSIGNIFICANT GROWTH Performed at [facility], [place] [address], [place], [place] [address]    Report Status 02/23/2022 FINAL  Final  Resp Panel by RT-PCR (Flu A&B,  Covid) Anterior Nasal Swab     Status: None   Collection Time: 02/22/22  3:28 AM   Specimen: Anterior Nasal Swab  Result Value Ref Range Status   SARS Coronavirus 2 by RT PCR NEGATIVE NEGATIVE Final    Comment: (NOTE) SARS-CoV-2 target nucleic acids are NOT DETECTED.  The SARS-CoV-2 RNA is generally detectable in upper respiratory specimens during the acute phase of infection. The lowest concentration of SARS-CoV-2 viral copies this assay can detect is 138 copies/mL. A negative result does not preclude SARS-Cov-2 infection and should not be used as  the sole basis for treatment or other patient management decisions. A negative result may occur with  improper specimen collection/handling, submission of specimen other than nasopharyngeal swab, presence of viral mutation(s) within the areas targeted by this assay, and inadequate number of viral copies(<138 copies/mL). A negative result must be combined with clinical observations, patient history, and epidemiological information. The expected result is Negative.  Fact Sheet for Patients:  EntrepreneurPulse.com.au  Fact Sheet for Healthcare Providers:  IncredibleEmployment.be  This test is no t yet approved or cleared by the [place] FDA and  has been authorized for detection and/or diagnosis of SARS-CoV-2 by FDA under an Emergency Use Authorization (EUA). This EUA will remain  in effect (meaning this test can be used) for the duration of the COVID-19 declaration under Section 564(b)(1) of the Act, 21 U.S.C.section 360bbb-3(b)(1), unless the authorization is terminated  or revoked sooner.       Influenza A by PCR NEGATIVE NEGATIVE Final   Influenza B by PCR NEGATIVE NEGATIVE Final    Comment: (NOTE) The Xpert Xpress SARS-CoV-2/FLU/RSV plus assay is intended as an aid in the diagnosis of influenza from Nasopharyngeal swab specimens and should not be used as a sole basis for treatment. Nasal washings and aspirates are unacceptable for Xpert Xpress SARS-CoV-2/FLU/RSV testing.  Fact Sheet for Patients: EntrepreneurPulse.com.au  Fact Sheet for Healthcare Providers: IncredibleEmployment.be  This test is not yet approved or cleared by the [place] FDA and has been authorized for detection and/or diagnosis of SARS-CoV-2 by FDA under an Emergency Use Authorization (EUA). This EUA will remain in effect (meaning this test can be used) for the duration of the COVID-19 declaration under Section 564(b)(1) of  the Act, 21 U.S.C. section 360bbb-3(b)(1), unless the authorization is terminated or revoked.  Performed at [facility], [place] [address], [place], [place] [address]   Respiratory (~20 pathogens) panel by PCR     Status: None   Collection Time: 02/22/22  3:28 AM   Specimen: Urine, Clean Catch; Respiratory  Result Value Ref Range Status   Adenovirus NOT DETECTED NOT DETECTED Final   Coronavirus 229E NOT DETECTED NOT DETECTED Final    Comment: (NOTE) The Coronavirus on the Respiratory Panel, DOES NOT test for the novel  Coronavirus (2019 nCoV)    Coronavirus HKU1 NOT DETECTED NOT DETECTED Final   Coronavirus NL63 NOT DETECTED NOT DETECTED Final   Coronavirus OC43 NOT DETECTED NOT DETECTED Final   Metapneumovirus NOT DETECTED NOT DETECTED Final   Rhinovirus / Enterovirus NOT DETECTED NOT DETECTED Final   Influenza A NOT DETECTED NOT DETECTED Final   Influenza B NOT DETECTED NOT DETECTED Final   Parainfluenza Virus 1 NOT DETECTED NOT DETECTED Final   Parainfluenza Virus 2 NOT DETECTED NOT DETECTED Final   Parainfluenza Virus 3 NOT DETECTED NOT DETECTED Final   Parainfluenza Virus 4 NOT DETECTED NOT DETECTED Final   Respiratory Syncytial Virus NOT DETECTED NOT  DETECTED Final   Bordetella pertussis NOT DETECTED NOT DETECTED Final   Bordetella Parapertussis NOT DETECTED NOT DETECTED Final   Chlamydophila pneumoniae NOT DETECTED NOT DETECTED Final   Mycoplasma pneumoniae NOT DETECTED NOT DETECTED Final    Comment: Performed at [facility], [place] [address], [place], [place] [address]  MRSA Next Gen by PCR, Nasal     Status: None   Collection Time: 02/22/22  3:33 AM   Specimen: STOOL; Nasal Swab  Result Value Ref Range Status   MRSA by PCR Next Gen NOT DETECTED NOT DETECTED Final    Comment: (NOTE) The GeneXpert MRSA Assay (FDA approved for NASAL specimens only), is one component of a comprehensive MRSA colonization surveillance program. It is not intended to diagnose MRSA  infection nor to guide or monitor treatment for MRSA infections. Test performance is not FDA approved in patients less than 66 years old. Performed at [facility], [place] [address] [place], [place] [address]   Culture, blood (Routine X 2) w Reflex to ID Panel     Status: Abnormal   Collection Time: 02/23/22  2:43 AM   Specimen: BLOOD  Result Value Ref Range Status   Specimen Description BLOOD SITE NOT SPECIFIED  Final   Special Requests   Final    BOTTLES DRAWN AEROBIC AND ANAEROBIC Blood Culture results may not be optimal due to an inadequate volume of blood received in culture bottles   Culture  Setup Time   Final    GRAM POSITIVE COCCI IN CLUSTERS AEROBIC BOTTLE ONLY CRITICAL RESULT CALLED TO, READ BACK BY AND VERIFIED WITH: PHARMD MICHAEL Deming ON 02/23/22 @ 2027 BY DRT    Culture (A)  Final    STAPHYLOCOCCUS AUREUS SUSCEPTIBILITIES PERFORMED ON PREVIOUS CULTURE WITHIN THE LAST 5 DAYS. Performed at [facility], [place] [address], [place], [place] [address]    Report Status 02/24/2022 FINAL  Final  Culture, blood (Routine X 2) w Reflex to ID Panel     Status: None   Collection Time: 02/23/22  2:43 AM   Specimen: BLOOD  Result Value Ref Range Status   Specimen Description BLOOD SITE NOT SPECIFIED  Final   Special Requests   Final    BOTTLES DRAWN AEROBIC AND ANAEROBIC Blood Culture adequate volume   Culture   Final    NO GROWTH 5 DAYS Performed at [facility], [place] [address], [place], [place] [address]    Report Status 02/28/2022 FINAL  Final  Culture, blood (Routine X 2) w Reflex to ID Panel     Status: None (Preliminary result)   Collection Time: 02/25/22  8:23 AM   Specimen: BLOOD RIGHT ARM  Result Value Ref Range Status   Specimen Description BLOOD RIGHT ARM  Final   Special Requests   Final    BOTTLES DRAWN AEROBIC AND ANAEROBIC Blood Culture adequate volume   Culture   Final    NO GROWTH 4 DAYS Performed at [facility], [place] [address], [place], [place] [address]    Report Status PENDING  Incomplete  Culture, blood (Routine X 2) w Reflex to ID Panel     Status: None (Preliminary result)   Collection Time: 02/25/22  8:30 AM   Specimen: BLOOD  Result Value Ref Range Status   Specimen Description BLOOD RIGHT ANTECUBITAL  Final   Special Requests   Final    BOTTLES DRAWN AEROBIC AND ANAEROBIC Blood Culture adequate volume   Culture  Final    NO GROWTH 4 DAYS Performed at [facility], [place] [address] [place], [place] [address]    Report Status PENDING  Incomplete     Medications:    amiodarone  200 mg Oral q morning   carvedilol  3.125 mg Oral BID WC   Chlorhexidine Gluconate Cloth  6 each Topical Q0600   darbepoetin (ARANESP) injection - DIALYSIS  200 mcg Intravenous Q Thu-HD   enoxaparin (LOVENOX) injection  30 mg Subcutaneous Daily   escitalopram  10 mg Oral q morning   feeding supplement  237 mL Oral BID BM   lidocaine  3 patch Transdermal Q24H   sodium chloride flush  3 mL Intravenous Q12H   tamsulosin  0.4 mg Oral QHS   Continuous Infusions:  sodium chloride      ceFAZolin (ANCEF) IV Stopped (02/26/22 2310)      LOS: 7 days   Charlynne Cousins  [facility]  03/01/2022, 11:17 AM

## 2022-03-01 NOTE — Progress Notes (Signed)
[facility] Progress Note   Subjective:    Last HD on 10/21 with 0.8 kg UF.  Per charting was cut short due to patient agitation.  Per note midday 10/23 he is to be moved to MWF HD schedule after discharge.  He states that he had a much better night last night  Review of systems:    Denies shortness of breath or chest pain Denies n/v/d  Objective Vitals:   02/28/22 0700 02/28/22 1607 02/28/22 1950 03/01/22 0452  BP: 124/68 131/63 (!) 126/56 120/65  Pulse: 61 83 95 96  Resp: '18 18 20 19  '$ Temp: 97.8 F (36.6 C) 98.9 F (37.2 C) 98.2 F (36.8 C) 98.3 F (36.8 C)  TempSrc: Oral Oral  Oral  SpO2: 100% 92% 92% 99%  Weight:      Height:       Physical Exam     General elderly male in bed in no acute distress HEENT normocephalic atraumatic extraocular movements intact sclera anicteric Neck supple trachea midline Lungs clear to auscultation bilaterally normal work of breathing at rest  Heart S1S2 no rub Abdomen soft nontender nondistended Extremities no edema  Psych normal mood and affect; very pleasant Neuro - awake on arrival and oriented to person, year (takes a little while but guesses correctly), and location  Access LUE AVF bruit and thrill    Filed Weights   02/21/22 1959  Weight: 77.6 kg    Intake/Output Summary (Last 24 hours) at 03/01/2022 0709 Last data filed at 02/28/2022 2018 Gross per 24 hour  Intake 173 ml  Output 1250 ml  Net -1077 ml     Additional Objective Labs: Basic Metabolic Panel: Recent Labs  Lab 02/27/22 0400 02/28/22 0425 03/01/22 0514  NA 139 134* 134*  K 3.8 4.1 4.1  CL 102 98 98  CO2 '30 28 25  '$ GLUCOSE 104* 100* 99  BUN 19 31* 39*  CREATININE 2.08* 2.80* 3.18*  CALCIUM 7.8* 7.9* 7.7*  PHOS 2.4* 3.9 4.2   Liver Function Tests: Recent Labs  Lab 02/27/22 0400 02/28/22 0425 03/01/22 0514  ALBUMIN 1.8* 1.8* 1.8*   CBC: Recent Labs  Lab 02/23/22 0243 02/25/22 0322 03/01/22 0514  WBC 7.9 5.5 7.9  HGB  7.6* 7.5* 7.7*  HCT 22.6* 22.1* 22.9*  MCV 104.1* 101.8* 102.2*  PLT 73* 60* 171   Blood Culture    Component Value Date/Time   SDES BLOOD RIGHT ANTECUBITAL 02/25/2022 0830   SPECREQUEST  02/25/2022 0830    BOTTLES DRAWN AEROBIC AND ANAEROBIC Blood Culture adequate volume   CULT  02/25/2022 0830    NO GROWTH 3 DAYS Performed at [facility], [address], [place], [place] [address]    REPTSTATUS PENDING 02/25/2022 0830   Cardiac Enzymes: No results for input(s): "CKTOTAL", "CKMB", "CKMBINDEX", "TROPONINI" in the last 168 hours.  Iron Studies:  No results for input(s): "IRON", "TIBC", "TRANSFERRIN", "FERRITIN" in the last 72 hours.  Lab Results  Component Value Date   INR 1.4 (H) 02/22/2022   INR 1.4 (H) 02/21/2022   INR 1.1 11/23/2021   Studies/Results: No results found.  Medications:  sodium chloride      ceFAZolin (ANCEF) IV Stopped (02/26/22 2310)    amiodarone  200 mg Oral q morning   carvedilol  3.125 mg Oral BID WC   Chlorhexidine Gluconate Cloth  6 each Topical Q0600   darbepoetin (ARANESP) injection - DIALYSIS  200 mcg Intravenous Q Thu-HD   enoxaparin (LOVENOX) injection  30 mg Subcutaneous Daily   escitalopram  10 mg Oral q morning   feeding supplement  237 mL Oral BID BM   lidocaine  3 patch Transdermal Q24H   sodium chloride flush  3 mL Intravenous Q12H   tamsulosin  0.4 mg Oral QHS    Dialysis Orders: TTS - NW 4hrs, BFR 400/AF 1.5,  EDW 79.8 kg, 3.0 K/ 2.5 Ca   Access: LU AVF  No Heparin  Mircera 150 mcg q2wks - last 9/27  Assessment/Plan: Sepsis due to gram-positive bacteremia in setting of left foot cellulitis and left foot osteomyelitis  -CT with likely PNA.  Started on broad spectrum ABX. ID consulted and ABX narrowed to IV cefazolin.  Blood cultures 02/20/2021 with gram-positive cocci in clusters.  Surveillance blood cultures 02/22/2022 negative.  ID recommended MRI thoracic/lumbar spine and TEE.  MRI thoracic showed signal  intensity at C6-C7 interspace which could be degenerative vs osteomyelitis.  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  MRI cervical spine with findings suspicious for discitis and osteomyelitis. No epidural abscess.  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 Sharol Given with orthopedics. No surgical intervention indicated at this time.  Does not think foot is cause of sepsis  Elevated troponin   - Per primary team.  Was felt 2/2 setting of sepsis  4. Hypokalemia - using higher k bath with HD   7. ESRD  - On HD TTS schedule.  Thursday and Saturday there were issues due to pt's dementia and agitation.  Hope that this is due to acute illness -  if does not improve he cannot be dialyzed in the OP setting like this as it would not be safe.  Will attempt HD again on Tuesday and hopeful for improvement in agitation - As above TTS schedule here for now and he is to be transitioned to MWF schedule on discharge.  Note he is changing to [employer] per charting  - Note final ID recs: Cefazolin 2g/HD-T/Th/Sat for 6 weeks with an end date of 04/08/22              8. Hypertension/volume   - Coreg held on admission - Does not appear fluid overloaded.  now under his EDW-  min UF with HD-     9. Anemia of CKD  - ESA  increased dose to 262mg every Thursday - tsat 10%, ferritin 1700, no IV iron.  - Transfuse if Hbg <7.0   10. Secondary Hyperparathyroidism  - hx hypophosphatemia and prev needed repletion - improved - Not on VDRA or binders.    11. Dispo- would consider another palliative discussion - he appears to be improving from a behavorial standpoint thankfully.  He does need to be able to tolerate HD from a behavioral standpoint prior to discharge   [place]10/24/2023,7:28 AM

## 2022-03-01 NOTE — Progress Notes (Signed)
Received patient in bed to unit.  Alert and oriented.  Informed consent signed and in chart.   Treatment initiated: 0823 Treatment completed: 1211  Patient tolerated well. Pt did start yelling at the end of tx and was d/c 13 minutes early.  Transported back to the room  Alert, without acute distress.  Hand-off given to patient's nurse.   Access used: Avfistula Access issues: none  Total UF removed: 1.4L   Medication(s) given: none Post HD VS: 127/64,71,16,98%,98.1 Post HD weight: 85.4kg   [facility]

## 2022-03-02 DIAGNOSIS — I2510 Atherosclerotic heart disease of native coronary artery without angina pectoris: Secondary | ICD-10-CM | POA: Diagnosis not present

## 2022-03-02 DIAGNOSIS — R6520 Severe sepsis without septic shock: Secondary | ICD-10-CM | POA: Diagnosis not present

## 2022-03-02 DIAGNOSIS — A419 Sepsis, unspecified organism: Secondary | ICD-10-CM | POA: Diagnosis not present

## 2022-03-02 LAB — CULTURE, BLOOD (ROUTINE X 2)
Culture: NO GROWTH
Culture: NO GROWTH
Special Requests: ADEQUATE
Special Requests: ADEQUATE

## 2022-03-02 MED ORDER — CHLORHEXIDINE GLUCONATE CLOTH 2 % EX PADS
6.0000 | MEDICATED_PAD | Freq: Every day | CUTANEOUS | Status: DC
  Administered 2022-03-03 – 2022-03-04 (×2): 6 via TOPICAL

## 2022-03-02 NOTE — Progress Notes (Signed)
Occupational Therapy Treatment Patient Details Name: Darryl Diaz MRN: [id number] DOB: 01-21-1941 Today's Date: 03/02/2022   History of present illness Pt is an 81 y/o M presented from SNF for decreased PO Intake, swelling and redness of the left foot.  Pt now with significant low back pain starting on 10/23.Recent admit 9/15 after fall at home with C50f, continues to wear C-collar. PMH includes alcohol abuse, ESRD on HD MWF, CHF, HTN, and HLD.   OT comments  Patient with increased alertness and ability to follow commands and participate this date.  Still needing a lot of assist, but able to participate with seated ADL and perform sit to stand with +2.  OT to continue efforts given the deficits listed below, but SNF level rehab prior to returning home is recommended.     Recommendations for follow up therapy are one component of a multi-disciplinary discharge planning process, led by the attending physician.  Recommendations may be updated based on patient status, additional functional criteria and insurance authorization.    Follow Up Recommendations  Skilled nursing-short term rehab (<3 hours/day)    Assistance Recommended at Discharge Frequent or constant Supervision/Assistance  Patient can return home with the following  Two people to help with walking and/or transfers;Assist for transportation;A lot of help with bathing/dressing/bathroom   Equipment Recommendations  Wheelchair cushion (measurements OT);Wheelchair (measurements OT)    Recommendations for Other Services      Precautions / Restrictions Precautions Precautions: Cervical;Fall Precaution Booklet Issued: No Required Braces or Orthoses: Cervical Brace Cervical Brace: Hard collar;At all times Restrictions Weight Bearing Restrictions: No       Mobility Bed Mobility Overal bed mobility: Needs Assistance Bed Mobility: Sit to Supine       Sit to supine: Mod assist        Transfers Overall transfer level:  Needs assistance Equipment used: Rolling walker (2 wheels) Transfers: Sit to/from Stand Sit to Stand: Max assist, +2 physical assistance                 Balance Overall balance assessment: Needs assistance Sitting-balance support: Feet supported, Bilateral upper extremity supported Sitting balance-Leahy Scale: Fair     Standing balance support: Bilateral upper extremity supported, During functional activity Standing balance-Leahy Scale: Zero                             ADL either performed or assessed with clinical judgement   ADL       Grooming: Wash/dry hands;Wash/dry face;Oral care;Sitting;Supervision/safety   Upper Body Bathing: Minimal assistance;Sitting   Lower Body Bathing: Moderate assistance;Sitting/lateral leans   Upper Body Dressing : Minimal assistance;Sitting   Lower Body Dressing: Moderate assistance;Sitting/lateral leans   Toilet Transfer: Moderate assistance;+2 for physical assistance;Stand-pivot;BSC/3in1   Toileting- Clothing Manipulation and Hygiene: Total assistance;Sit to/from stand;+2 for physical assistance              Extremity/Trunk Assessment Upper Extremity Assessment Upper Extremity Assessment: Overall WFL for tasks assessed   Lower Extremity Assessment Lower Extremity Assessment: Defer to PT evaluation   Cervical / Trunk Assessment Cervical / Trunk Assessment: Kyphotic    Vision Baseline Vision/History: 1 Wears glasses Patient Visual Report: No change from baseline     Perception Perception Perception: Not tested   Praxis Praxis Praxis: Not tested    Cognition Arousal/Alertness: Awake/alert Behavior During Therapy: WFL for tasks assessed/performed Overall Cognitive Status: No family/caregiver present to determine baseline cognitive functioning  Pertinent Vitals/ Pain       Pain Assessment Pain Assessment: Faces Faces  Pain Scale: Hurts a little bit Pain Location: generalized, back Pain Descriptors / Indicators: Grimacing, Aching Pain Intervention(s): Monitored during session                                                          Frequency  Min 2X/week        Progress Toward Goals  OT Goals(current goals can now be found in the care plan section)  Progress towards OT goals: Progressing toward goals  Acute Rehab OT Goals OT Goal Formulation: With patient Time For Goal Achievement: March 24, 2022 Potential to Achieve Goals: Good  Plan Discharge plan remains appropriate    Co-evaluation                 AM-PAC OT "6 Clicks" Daily Activity     Outcome Measure   Help from another person eating meals?: A Little Help from another person taking care of personal grooming?: A Little Help from another person toileting, which includes using toliet, bedpan, or urinal?: Total Help from another person bathing (including washing, rinsing, drying)?: A Lot Help from another person to put on and taking off regular upper body clothing?: A Little Help from another person to put on and taking off regular lower body clothing?: A Lot 6 Click Score: 14    End of Session Equipment Utilized During Treatment: Gait belt;Rolling walker (2 wheels)  OT Visit Diagnosis: Unsteadiness on feet (R26.81);History of falling (Z91.81)   Activity Tolerance No increased pain   Patient Left in bed;with call bell/phone within reach;with bed alarm set   Nurse Communication Mobility status        Time: 8676-7209 OT Time Calculation (min): 17 min  Charges: OT General Charges $OT Visit: 1 Visit OT Treatments $Self Care/Home Management : 8-22 mins  03/02/2022  RP, OTR/L  Acute Rehabilitation Services  Office:  [phone]   Metta Clines 03/02/2022, 11:07 AM

## 2022-03-02 NOTE — Consult Note (Signed)
   [facility] Inpatient Consult   03/02/2022  Darryl Diaz 27-Apr-1941 [id number]  [facility] [ACO] Patient: Medicare ACO REACH  Primary Care Provider:  [name], MD, [place]   If the patient goes to a [facility] affiliated facility then, patient can be followed by [facility] RN with traditional Medicare and approved Medicare Advantage plans.   Met on unit with inpatient TOC and plan is currently for a [facility] affiliated facility for transition.  Plan:   [facility] PAC RN can follow for any known or needs for transitional care needs for returning to post facility care or complex disease management.  For questions or referrals, please contact:   Natividad Brood, RN BSN [place]  [phone] business mobile phone Toll free office [phone]  *[place]  [phone] Fax number: [phone] [email or web address]

## 2022-03-02 NOTE — Progress Notes (Signed)
[facility] PROGRESS NOTE    Progress Note  Darryl Diaz  GYI:[id number] DOB: 10/08/1940 DOA: 02/21/2022 PCP: [name], MD     Brief Narrative:   Darryl Diaz is an 81 y.o. 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    Assessment/Plan:   Sepsis due to gram-positive bacteremia in the setting discitis and osteomyelitis of C6-C7: Blood cultures in 02/20/2022, 02/21/2022, 02/22/2022 showed MSSA bacteremia. Blood cultures repeated on 02/25/2022 are negative till date. MRI of the C-spine showed discitis and osteomyelitis with possible abscess C6-C7 ID was consulted now has been transitioned to IV cefazolin.   Has remained afebrile. TEE cannot be performed due to his C1 articular facet fracture. Physical therapy evaluated the patient, he will need to go to skilled nursing facility. 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  Hypokalemia: Nephrology has been consulted, further management per hemodialysis.  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 Hemoglobin has remained relatively stable at 7.5-7.6.  C1  articular facet fracture: Currently in a c-collar. Cardiology will not proceed with TEE  Elevated troponins: In the setting of sepsis and end-stage renal disease, likely demand ischemia he denies any chest pain twelve-lead EKG showed no signs of ischemia.  Mild aortic stenosis: Mean gradient of 12 continue to monitor as an outpatient.  Mild hyponatremia: Further management per renal with dialysis. Now resolved.  Diarrhea: Has had no further diarrhea in house.  Paroxysmal atrial fibrillation: On anticoagulation rate control continue amiodarone.  Essential hypertension: Blood pressures well controlled continue current regimen.  Hyperlipidemia: Continue statins.  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  Prolonged QTc: Noted continue to monitor on telemetry.    DVT prophylaxis: lovenox Family Communication:none Status is: Inpatient Remains inpatient appropriate because: Likely back to SNF after infectious work-up and treatment has been completed.    Code Status:     Code Status Orders  (From admission, onward)           Start     Ordered   02/22/22 0243  Do not attempt resuscitation (DNR)  Continuous       Question Answer Comment  In the event of cardiac or respiratory ARREST Do not call a "code blue"   In the event of cardiac or respiratory ARREST Do not perform Intubation, CPR, defibrillation or ACLS   In the event of cardiac or respiratory ARREST Use medication by any route, position, wound care, and other measures to relive pain and suffering. May use oxygen, suction and manual treatment of airway obstruction as needed for comfort.      02/22/22 0242  Code Status History     Date Active Date Inactive Code Status Order ID Comments User Context   01/21/2022 0603 01/27/2022 1811 DNR [id number]  Shela Leff, MD ED   11/24/2021 0007 11/26/2021 2014 DNR [id number]  Rhetta Mura, DO ED   09/27/2021 2202 09/30/2021 2305 DNR [id number]  Rhetta Mura, DO ED   06/07/2021 2050 06/14/2021 2022 DNR [id number]  Rhetta Mura, DO ED   05/02/2021 0353 05/02/2021 2111 DNR [id number]  Elwyn Reach, MD Inpatient   01/17/2021 0623 01/22/2021 2354 DNR [id number]  Reubin Milan, MD ED   01/06/2021 2258 01/09/2021 2208 DNR [id number]  Lenore Cordia, MD ED   12/02/2020 1537 12/04/2020 1641 Full Code [id number]  Karmen Bongo, MD ED   10/13/2020 2222 10/17/2020 2016 Full Code [id number]  Chotiner, Yevonne Aline, MD Inpatient   09/07/2020 2215 09/13/2020 2142 Full Code [id number]  Lenore Cordia, MD ED   03/16/2020 0811 03/20/2020 1908 Full Code [id number]  Karmen Bongo, MD ED   03/06/2020 0100 03/11/2020 1501 Full Code [id number]  Cathlyn Parsons, PA-C Inpatient   03/06/2020 0100 03/06/2020 0100 Full Code [id number]  Cathlyn Parsons, PA-C Inpatient   02/22/2020 1704 03/06/2020 0029 Full Code [id number]  Freada Bergeron, MD ED   02/05/2020 2210 02/08/2020 0218 Full Code [id number]  Barrett, Evelene Croon, PA-C ED   10/15/2019 1204 10/16/2019 1922 Full Code [id number]  Norman Herrlich Inpatient   04/20/2019 1844 04/23/2019 1723 Full Code [id number]  Donita Brooks, NP ED   02/25/2019 2021 03/05/2019 1432 Full Code [id number]  Shela Leff, MD ED   08/11/2018 1707 08/12/2018 1446 Full Code [id number]  Barb Merino, MD Inpatient   04/30/2016 0253 05/04/2016 1828 Full Code [id number]  Wylene Simmer, MD Inpatient   03/30/2016 1347 04/04/2016 1440 Full Code [id number]  Rondel Jumbo PA-C ED   03/30/2016 1347 03/30/2016 1347 Full Code [id number]  Rondel Jumbo, PA-C ED         IV Access:   Peripheral IV   Procedures and diagnostic studies:   No results found.   Medical Consultants:   None.   Subjective:    [name] no complaints today.  Objective:    Vitals:   03/01/22 1640 03/01/22 2006 03/02/22 0403 03/02/22 0752  BP: 127/74 130/83 137/75 131/60   Pulse: 85 87 72 64  Resp: '15 16 17 16  '$ Temp: 98.8 F (37.1 C) 98 F (36.7 C) 98.2 F (36.8 C) 98 F (36.7 C)  TempSrc: Oral Oral Oral Oral  SpO2: 99% 98% 98% 97%  Weight:      Height:       SpO2: 97 %   Intake/Output Summary (Last 24 hours) at 03/02/2022 1104 Last data filed at 03/02/2022 0900 Gross per 24 hour  Intake 220 ml  Output 301.4 ml  Net -81.4 ml    Filed Weights   02/21/22 1959 03/01/22 0815 03/01/22 1220  Weight: 77.6 kg 86.8 kg 85.4 kg    Exam: General exam: In no acute distress, c-collar in place Respiratory system: Good air movement and clear to auscultation. Cardiovascular system: S1 & S2 heard, RRR. No JVD. Gastrointestinal system: Abdomen is nondistended, soft and nontender.  Psychiatry: Judgement and insight appear normal. Mood & affect appropriate.  Data Reviewed:    Labs: Basic Metabolic Panel: Recent Labs  Lab 02/25/22 0322 02/26/22 1011 02/27/22 0400 02/28/22 0425 03/01/22 0514  NA 136 135 139 134* 134*  K 3.4* 3.3* 3.8  4.1 4.1  CL 97* 99 102 98 98  CO2 '28 28 30 28 25  '$ GLUCOSE 99 121* 104* 100* 99  BUN 22 38* 19 31* 39*  CREATININE 2.03* 3.03* 2.08* 2.80* 3.18*  CALCIUM 7.9* 7.6* 7.8* 7.9* 7.7*  MG  --  1.7  --   --   --   PHOS 1.4* 2.6 2.4* 3.9 4.2    GFR Estimated Creatinine Clearance: 20.3 mL/min (A) (by C-G formula based on SCr of 3.18 mg/dL (H)). Liver Function Tests: Recent Labs  Lab 02/25/22 0322 02/26/22 1011 02/27/22 0400 02/28/22 0425 03/01/22 0514  ALBUMIN 1.9* 1.9* 1.8* 1.8* 1.8*    No results for input(s): "LIPASE", "AMYLASE" in the last 168 hours. No results for input(s): "AMMONIA" in the last 168 hours.  Coagulation profile No results for input(s): "INR", "PROTIME" in the last 168 hours.  COVID-19 Labs  No results for input(s): "DDIMER", "FERRITIN", "LDH", "CRP" in the last 72 hours.   Lab Results  Component Value Date   SARSCOV2NAA NEGATIVE 02/22/2022   SARSCOV2NAA NEGATIVE 02/21/2022    SARSCOV2NAA NEGATIVE 12/20/2021   SARSCOV2NAA POSITIVE (A) 06/03/2021    CBC: Recent Labs  Lab 02/25/22 0322 03/01/22 0514  WBC 5.5 7.9  HGB 7.5* 7.7*  HCT 22.1* 22.9*  MCV 101.8* 102.2*  PLT 60* 171    Cardiac Enzymes: No results for input(s): "CKTOTAL", "CKMB", "CKMBINDEX", "TROPONINI" in the last 168 hours.  BNP (last 3 results) No results for input(s): "PROBNP" in the last 8760 hours. CBG: Recent Labs  Lab 02/23/22 2345 02/25/22 0824 02/25/22 1200 02/27/22 1201 02/27/22 1648  GLUCAP 136* 106* 139* 123* 115*    D-Dimer: No results for input(s): "DDIMER" in the last 72 hours. Hgb A1c: No results for input(s): "HGBA1C" in the last 72 hours. Lipid Profile: No results for input(s): "CHOL", "HDL", "LDLCALC", "TRIG", "CHOLHDL", "LDLDIRECT" in the last 72 hours. Thyroid function studies: No results for input(s): "TSH", "T4TOTAL", "T3FREE", "THYROIDAB" in the last 72 hours.  Invalid input(s): "FREET3"  Anemia work up: No results for input(s): "VITAMINB12", "FOLATE", "FERRITIN", "TIBC", "IRON", "RETICCTPCT" in the last 72 hours.  Sepsis Labs: Recent Labs  Lab 02/25/22 0322 03/01/22 0514  WBC 5.5 7.9    Microbiology Recent Results (from the past 240 hour(s))  SARS Coronavirus 2 by RT PCR (hospital order, performed in [facility] hospital lab) *cepheid single result test* Anterior Nasal Swab     Status: None   Collection Time: 02/21/22  3:16 AM   Specimen: Anterior Nasal Swab  Result Value Ref Range Status   SARS Coronavirus 2 by RT PCR NEGATIVE NEGATIVE Final    Comment: (NOTE) SARS-CoV-2 target nucleic acids are NOT DETECTED.  The SARS-CoV-2 RNA is generally detectable in upper and lower respiratory specimens during the acute phase of infection. The lowest concentration of SARS-CoV-2 viral copies this assay can detect is 250 copies / mL. A negative result does not preclude SARS-CoV-2 infection and should not be used as the sole basis for treatment or  other patient management decisions.  A negative result may occur with improper specimen collection / handling, submission of specimen other than nasopharyngeal swab, presence of viral mutation(s) within the areas targeted by this assay, and inadequate number of viral copies (<250 copies / mL). A negative result must be combined with clinical observations, patient history, and epidemiological information.  Fact Sheet for Patients:   https://www.patel.info/  Fact Sheet for Healthcare Providers: https://hall.com/  This test is not yet approved or  cleared  by the [place] and has been authorized for detection and/or diagnosis of SARS-CoV-2 by FDA under an Emergency Use Authorization (EUA).  This EUA will remain in effect (meaning this test can be used) for the duration of the COVID-19 declaration under Section 564(b)(1) of the Act, 21 U.S.C. section 360bbb-3(b)(1), unless the authorization is terminated or revoked sooner.  Performed at [facility], [place] [address], [place], [place] [address]   Blood culture (routine x 2)     Status: Abnormal   Collection Time: 02/21/22  4:07 AM   Specimen: BLOOD RIGHT HAND  Result Value Ref Range Status   Specimen Description BLOOD RIGHT HAND  Final   Special Requests AEROBIC BOTTLE ONLY Blood Culture adequate volume  Final   Culture  Setup Time   Final    GRAM POSITIVE COCCI IN CLUSTERS AEROBIC BOTTLE ONLY CRITICAL RESULT CALLED TO, READ BACK BY AND VERIFIED WITH: Christine ON 02/21/22 @ 2022 BY DRT Performed at [facility], [place] [address] [place], [place] [address]    Culture STAPHYLOCOCCUS AUREUS (A)  Final   Report Status 02/23/2022 FINAL  Final   Organism ID, Bacteria STAPHYLOCOCCUS AUREUS  Final      Susceptibility   Staphylococcus aureus - MIC*    CIPROFLOXACIN <=0.5 SENSITIVE Sensitive     ERYTHROMYCIN >=8 RESISTANT Resistant     GENTAMICIN <=0.5 SENSITIVE  Sensitive     OXACILLIN <=0.25 SENSITIVE Sensitive     TETRACYCLINE >=16 RESISTANT Resistant     VANCOMYCIN <=0.5 SENSITIVE Sensitive     TRIMETH/SULFA <=10 SENSITIVE Sensitive     CLINDAMYCIN RESISTANT Resistant     RIFAMPIN <=0.5 SENSITIVE Sensitive     Inducible Clindamycin POSITIVE Resistant     * STAPHYLOCOCCUS AUREUS  Blood Culture ID Panel (Reflexed)     Status: Abnormal   Collection Time: 02/21/22  4:07 AM  Result Value Ref Range Status   Enterococcus faecalis NOT DETECTED NOT DETECTED Final   Enterococcus Faecium NOT DETECTED NOT DETECTED Final   Listeria monocytogenes NOT DETECTED NOT DETECTED Final   Staphylococcus species DETECTED (A) NOT DETECTED Final    Comment: CRITICAL RESULT CALLED TO, READ BACK BY AND VERIFIED WITH: PHARM CATHY PEARCE ON 02/21/22 @ 2022 BY DRT    Staphylococcus aureus (BCID) DETECTED (A) NOT DETECTED Final    Comment: CRITICAL RESULT CALLED TO, READ BACK BY AND VERIFIED WITH: PHARM CATHY PEARCE ON 02/21/22 @ 2022 BY DRT    Staphylococcus epidermidis NOT DETECTED NOT DETECTED Final   Staphylococcus lugdunensis NOT DETECTED NOT DETECTED Final   Streptococcus species NOT DETECTED NOT DETECTED Final   Streptococcus agalactiae NOT DETECTED NOT DETECTED Final   Streptococcus pneumoniae NOT DETECTED NOT DETECTED Final   Streptococcus pyogenes NOT DETECTED NOT DETECTED Final   A.calcoaceticus-baumannii NOT DETECTED NOT DETECTED Final   Bacteroides fragilis NOT DETECTED NOT DETECTED Final   Enterobacterales NOT DETECTED NOT DETECTED Final   Enterobacter cloacae complex NOT DETECTED NOT DETECTED Final   Escherichia coli NOT DETECTED NOT DETECTED Final   Klebsiella aerogenes NOT DETECTED NOT DETECTED Final   Klebsiella oxytoca NOT DETECTED NOT DETECTED Final   Klebsiella pneumoniae NOT DETECTED NOT DETECTED Final   Proteus species NOT DETECTED NOT DETECTED Final   Salmonella species NOT DETECTED NOT DETECTED Final   Serratia marcescens NOT DETECTED NOT  DETECTED Final   Haemophilus influenzae NOT DETECTED NOT DETECTED Final   Neisseria meningitidis NOT DETECTED NOT DETECTED Final   Pseudomonas  aeruginosa NOT DETECTED NOT DETECTED Final   Stenotrophomonas maltophilia NOT DETECTED NOT DETECTED Final   Candida albicans NOT DETECTED NOT DETECTED Final   Candida auris NOT DETECTED NOT DETECTED Final   Candida glabrata NOT DETECTED NOT DETECTED Final   Candida krusei NOT DETECTED NOT DETECTED Final   Candida parapsilosis NOT DETECTED NOT DETECTED Final   Candida tropicalis NOT DETECTED NOT DETECTED Final   Cryptococcus neoformans/gattii NOT DETECTED NOT DETECTED Final   Meth resistant mecA/C and MREJ NOT DETECTED NOT DETECTED Final    Comment: Performed at [facility], [place] [address], [place], [place] [address]  Blood culture (routine x 2)     Status: Abnormal   Collection Time: 02/21/22  6:19 AM   Specimen: BLOOD LEFT HAND  Result Value Ref Range Status   Specimen Description BLOOD LEFT HAND  Final   Special Requests AEROBIC BOTTLE ONLY Blood Culture adequate volume  Final   Culture  Setup Time   Final    GRAM POSITIVE COCCI IN CLUSTERS AEROBIC BOTTLE ONLY CRITICAL VALUE NOTED.  VALUE IS CONSISTENT WITH PREVIOUSLY REPORTED AND CALLED VALUE.    Culture (A)  Final    STAPHYLOCOCCUS AUREUS SUSCEPTIBILITIES PERFORMED ON PREVIOUS CULTURE WITHIN THE LAST 5 DAYS. Performed at [facility], [place] [address], [place], [place] [address]    Report Status 02/23/2022 FINAL  Final  Blood Culture (routine x 2)     Status: Abnormal   Collection Time: 02/21/22  8:42 PM   Specimen: BLOOD RIGHT FOREARM  Result Value Ref Range Status   Specimen Description BLOOD RIGHT FOREARM  Final   Special Requests   Final    BOTTLES DRAWN AEROBIC AND ANAEROBIC Blood Culture adequate volume   Culture  Setup Time   Final    GRAM POSITIVE COCCI IN CLUSTERS IN BOTH AEROBIC AND ANAEROBIC BOTTLES CRITICAL VALUE NOTED.  VALUE IS CONSISTENT WITH PREVIOUSLY  REPORTED AND CALLED VALUE.    Culture (A)  Final    STAPHYLOCOCCUS AUREUS SUSCEPTIBILITIES PERFORMED ON PREVIOUS CULTURE WITHIN THE LAST 5 DAYS. Performed at [facility], [place] [address], [place], [place] [address]    Report Status 02/24/2022 FINAL  Final  Blood Culture (routine x 2)     Status: Abnormal   Collection Time: 02/21/22  8:49 PM   Specimen: BLOOD  Result Value Ref Range Status   Specimen Description BLOOD RIGHT ANTECUBITAL  Final   Special Requests   Final    BOTTLES DRAWN AEROBIC AND ANAEROBIC Blood Culture adequate volume   Culture  Setup Time   Final    GRAM POSITIVE COCCI IN CLUSTERS IN BOTH AEROBIC AND ANAEROBIC BOTTLES CRITICAL RESULT CALLED TO, READ BACK BY AND VERIFIED WITH: PHARMD E [place] [id number] AT 1234 BY CM    Culture (A)  Final    STAPHYLOCOCCUS AUREUS SUSCEPTIBILITIES PERFORMED ON PREVIOUS CULTURE WITHIN THE LAST 5 DAYS. Performed at [facility], [place] [address] [place], [place] [address]    Report Status 02/24/2022 FINAL  Final  Urine Culture     Status: Abnormal   Collection Time: 02/22/22  3:28 AM   Specimen: Urine, Clean Catch  Result Value Ref Range Status   Specimen Description URINE, CLEAN CATCH  Final   Special Requests NONE  Final   Culture (A)  Final    <10,000 COLONIES/mL INSIGNIFICANT GROWTH Performed at [facility], [place] [address] [place], [place] [address]    Report Status 02/23/2022 FINAL  Final  Resp Panel by RT-PCR (Flu A&B, Covid) Anterior Nasal Swab     Status: None   Collection Time: 02/22/22  3:28 AM   Specimen: Anterior Nasal Swab  Result Value Ref Range Status   SARS Coronavirus 2 by RT PCR NEGATIVE NEGATIVE Final    Comment: (NOTE) SARS-CoV-2 target nucleic acids are NOT DETECTED.  The SARS-CoV-2 RNA is generally detectable in upper respiratory specimens during the acute phase of infection. The lowest concentration of SARS-CoV-2 viral copies this assay can detect is 138 copies/mL. A negative  result does not preclude SARS-Cov-2 infection and should not be used as the sole basis for treatment or other patient management decisions. A negative result may occur with  improper specimen collection/handling, submission of specimen other than nasopharyngeal swab, presence of viral mutation(s) within the areas targeted by this assay, and inadequate number of viral copies(<138 copies/mL). A negative result must be combined with clinical observations, patient history, and epidemiological information. The expected result is Negative.  Fact Sheet for Patients:  EntrepreneurPulse.com.au  Fact Sheet for Healthcare Providers:  IncredibleEmployment.be  This test is no t yet approved or cleared by the [place] FDA and  has been authorized for detection and/or diagnosis of SARS-CoV-2 by FDA under an Emergency Use Authorization (EUA). This EUA will remain  in effect (meaning this test can be used) for the duration of the COVID-19 declaration under Section 564(b)(1) of the Act, 21 U.S.C.section 360bbb-3(b)(1), unless the authorization is terminated  or revoked sooner.       Influenza A by PCR NEGATIVE NEGATIVE Final   Influenza B by PCR NEGATIVE NEGATIVE Final    Comment: (NOTE) The Xpert Xpress SARS-CoV-2/FLU/RSV plus assay is intended as an aid in the diagnosis of influenza from Nasopharyngeal swab specimens and should not be used as a sole basis for treatment. Nasal washings and aspirates are unacceptable for Xpert Xpress SARS-CoV-2/FLU/RSV testing.  Fact Sheet for Patients: EntrepreneurPulse.com.au  Fact Sheet for Healthcare Providers: IncredibleEmployment.be  This test is not yet approved or cleared by the [place] FDA and has been authorized for detection and/or diagnosis of SARS-CoV-2 by FDA under an Emergency Use Authorization (EUA). This EUA will remain in effect (meaning this test can be used)  for the duration of the COVID-19 declaration under Section 564(b)(1) of the Act, 21 U.S.C. section 360bbb-3(b)(1), unless the authorization is terminated or revoked.  Performed at [facility], [place] [address] [place], [place] [address]   Respiratory (~20 pathogens) panel by PCR     Status: None   Collection Time: 02/22/22  3:28 AM   Specimen: Urine, Clean Catch; Respiratory  Result Value Ref Range Status   Adenovirus NOT DETECTED NOT DETECTED Final   Coronavirus 229E NOT DETECTED NOT DETECTED Final    Comment: (NOTE) The Coronavirus on the Respiratory Panel, DOES NOT test for the novel  Coronavirus (2019 nCoV)    Coronavirus HKU1 NOT DETECTED NOT DETECTED Final   Coronavirus NL63 NOT DETECTED NOT DETECTED Final   Coronavirus OC43 NOT DETECTED NOT DETECTED Final   Metapneumovirus NOT DETECTED NOT DETECTED Final   Rhinovirus / Enterovirus NOT DETECTED NOT DETECTED Final   Influenza A NOT DETECTED NOT DETECTED Final   Influenza B NOT DETECTED NOT DETECTED Final   Parainfluenza Virus 1 NOT DETECTED NOT DETECTED Final   Parainfluenza Virus 2 NOT DETECTED NOT DETECTED Final   Parainfluenza Virus 3 NOT DETECTED NOT DETECTED Final   Parainfluenza Virus 4 NOT DETECTED NOT DETECTED Final  Respiratory Syncytial Virus NOT DETECTED NOT DETECTED Final   Bordetella pertussis NOT DETECTED NOT DETECTED Final   Bordetella Parapertussis NOT DETECTED NOT DETECTED Final   Chlamydophila pneumoniae NOT DETECTED NOT DETECTED Final   Mycoplasma pneumoniae NOT DETECTED NOT DETECTED Final    Comment: Performed at [facility], [place] [address] [place], [place] [address]  MRSA Next Gen by PCR, Nasal     Status: None   Collection Time: 02/22/22  3:33 AM   Specimen: STOOL; Nasal Swab  Result Value Ref Range Status   MRSA by PCR Next Gen NOT DETECTED NOT DETECTED Final    Comment: (NOTE) The GeneXpert MRSA Assay (FDA approved for NASAL specimens only), is one component of a comprehensive MRSA  colonization surveillance program. It is not intended to diagnose MRSA infection nor to guide or monitor treatment for MRSA infections. Test performance is not FDA approved in patients less than 57 years old. Performed at [facility], [place] [address], [place], [place] [address]   Culture, blood (Routine X 2) w Reflex to ID Panel     Status: Abnormal   Collection Time: 02/23/22  2:43 AM   Specimen: BLOOD  Result Value Ref Range Status   Specimen Description BLOOD SITE NOT SPECIFIED  Final   Special Requests   Final    BOTTLES DRAWN AEROBIC AND ANAEROBIC Blood Culture results may not be optimal due to an inadequate volume of blood received in culture bottles   Culture  Setup Time   Final    GRAM POSITIVE COCCI IN CLUSTERS AEROBIC BOTTLE ONLY CRITICAL RESULT CALLED TO, READ BACK BY AND VERIFIED WITH: PHARMD MICHAEL Meriwether ON 02/23/22 @ 2027 BY DRT    Culture (A)  Final    STAPHYLOCOCCUS AUREUS SUSCEPTIBILITIES PERFORMED ON PREVIOUS CULTURE WITHIN THE LAST 5 DAYS. Performed at [facility], [place] [address], [place], [place] [address]    Report Status 02/24/2022 FINAL  Final  Culture, blood (Routine X 2) w Reflex to ID Panel     Status: None   Collection Time: 02/23/22  2:43 AM   Specimen: BLOOD  Result Value Ref Range Status   Specimen Description BLOOD SITE NOT SPECIFIED  Final   Special Requests   Final    BOTTLES DRAWN AEROBIC AND ANAEROBIC Blood Culture adequate volume   Culture   Final    NO GROWTH 5 DAYS Performed at [facility], [place] [address], [place], [place] [address]    Report Status 02/28/2022 FINAL  Final  Culture, blood (Routine X 2) w Reflex to ID Panel     Status: None   Collection Time: 02/25/22  8:23 AM   Specimen: BLOOD RIGHT ARM  Result Value Ref Range Status   Specimen Description BLOOD RIGHT ARM  Final   Special Requests   Final    BOTTLES DRAWN AEROBIC AND ANAEROBIC Blood Culture adequate volume   Culture   Final    NO GROWTH 5  DAYS Performed at [facility], [place] [address], [place], [place] [address]    Report Status 03/02/2022 FINAL  Final  Culture, blood (Routine X 2) w Reflex to ID Panel     Status: None   Collection Time: 02/25/22  8:30 AM   Specimen: BLOOD  Result Value Ref Range Status   Specimen Description BLOOD RIGHT ANTECUBITAL  Final   Special Requests   Final    BOTTLES DRAWN AEROBIC AND ANAEROBIC Blood Culture adequate volume  Culture   Final    NO GROWTH 5 DAYS Performed at [facility], [place] [address], [place], [place] [address]    Report Status 03/02/2022 FINAL  Final     Medications:    amiodarone  200 mg Oral q morning   carvedilol  3.125 mg Oral BID WC   Chlorhexidine Gluconate Cloth  6 each Topical Q0600   darbepoetin (ARANESP) injection - DIALYSIS  200 mcg Intravenous Q Thu-HD   enoxaparin (LOVENOX) injection  30 mg Subcutaneous Daily   escitalopram  10 mg Oral q morning   feeding supplement  237 mL Oral BID BM   lidocaine  3 patch Transdermal Q24H   sodium chloride flush  3 mL Intravenous Q12H   tamsulosin  0.4 mg Oral QHS   Continuous Infusions:  sodium chloride      ceFAZolin (ANCEF) IV 2 g (03/01/22 1803)      LOS: 8 days   Charlynne Cousins  [facility]  03/02/2022, 11:04 AM

## 2022-03-02 NOTE — Progress Notes (Addendum)
Darryl Diaz Progress Note   Subjective:    Last HD on 10/24 with 1.4 kg UF.  No issues with his treatment that I saw however per a chart note he did start yelling at the end of dialysis and his treatment was stopped 13 minutes early per charting.  He doesn't recall that episode.  Per charting he is to be moved to MWF HD schedule after discharge.     Review of systems:    Denies shortness of breath or chest pain Denies n/v/d  Objective Vitals:   03/01/22 1220 03/01/22 1640 03/01/22 2006 03/02/22 0403  BP: 127/64 127/74 130/83 137/75  Pulse: 71 85 87 72  Resp: '16 15 16 17  '$ Temp: 98.1 F (36.7 C) 98.8 F (37.1 C) 98 F (36.7 C) 98.2 F (36.8 C)  TempSrc:  Oral Oral Oral  SpO2: 98% 99% 98% 98%  Weight: 85.4 kg     Height:       Physical Exam      General elderly male in bed in no acute distress HEENT normocephalic atraumatic extraocular movements intact sclera anicteric Neck supple trachea midline Lungs clear to auscultation bilaterally normal work of breathing at rest  Heart S1S2 no rub Abdomen soft nontender nondistended Extremities no edema  Psych normal mood and affect; very pleasant Neuro - awake on arrival and oriented to person, year (takes a little while on the year but guesses correctly).  Correct location on 2nd guess (first guess a hotel room) Access LUE AVF bruit and thrill    Filed Weights   02/21/22 1959 03/01/22 0815 03/01/22 1220  Weight: 77.6 kg 86.8 kg 85.4 kg    Intake/Output Summary (Last 24 hours) at 03/02/2022 0703 Last data filed at 03/02/2022 0602 Gross per 24 hour  Intake 100 ml  Output 301.4 ml  Net -201.4 ml     Additional Objective Labs: Basic Metabolic Panel: Recent Labs  Lab 02/27/22 0400 02/28/22 0425 03/01/22 0514  NA 139 134* 134*  K 3.8 4.1 4.1  CL 102 98 98  CO2 '30 28 25  '$ GLUCOSE 104* 100* 99  BUN 19 31* 39*  CREATININE 2.08* 2.80* 3.18*  CALCIUM 7.8* 7.9* 7.7*  PHOS 2.4* 3.9 4.2   Liver Function  Tests: Recent Labs  Lab 02/27/22 0400 02/28/22 0425 03/01/22 0514  ALBUMIN 1.8* 1.8* 1.8*   CBC: Recent Labs  Lab 02/25/22 0322 03/01/22 0514  WBC 5.5 7.9  HGB 7.5* 7.7*  HCT 22.1* 22.9*  MCV 101.8* 102.2*  PLT 60* 171   Blood Culture    Component Value Date/Time   SDES BLOOD RIGHT ANTECUBITAL 02/25/2022 0830   SPECREQUEST  02/25/2022 0830    BOTTLES DRAWN AEROBIC AND ANAEROBIC Blood Culture adequate volume   CULT  02/25/2022 0830    NO GROWTH 4 DAYS Performed at [facility], [place] [address], [place], [place] [address]    REPTSTATUS PENDING 02/25/2022 0830   Cardiac Enzymes: No results for input(s): "CKTOTAL", "CKMB", "CKMBINDEX", "TROPONINI" in the last 168 hours.  Iron Studies:  No results for input(s): "IRON", "TIBC", "TRANSFERRIN", "FERRITIN" in the last 72 hours.  Lab Results  Component Value Date   INR 1.4 (H) 02/22/2022   INR 1.4 (H) 02/21/2022   INR 1.1 11/23/2021   Studies/Results: No results found.  Medications:  sodium chloride      ceFAZolin (ANCEF) IV 2 g (03/01/22 1803)    amiodarone  200 mg Oral q morning   carvedilol  3.125 mg  Oral BID WC   Chlorhexidine Gluconate Cloth  6 each Topical Q0600   darbepoetin (ARANESP) injection - DIALYSIS  200 mcg Intravenous Q Thu-HD   enoxaparin (LOVENOX) injection  30 mg Subcutaneous Daily   escitalopram  10 mg Oral q morning   feeding supplement  237 mL Oral BID BM   lidocaine  3 patch Transdermal Q24H   sodium chloride flush  3 mL Intravenous Q12H   tamsulosin  0.4 mg Oral QHS    Dialysis Orders: TTS - NW 4hrs, BFR 400/AF 1.5,  EDW 79.8 kg, 3.0 K/ 2.5 Ca   Access: LU AVF  No Heparin  Mircera 150 mcg q2wks - last 9/27  Assessment/Plan: Sepsis due to gram-positive bacteremia in setting of left foot cellulitis and left foot osteomyelitis  -CT with likely PNA.  Started on broad spectrum ABX. ID consulted and ABX narrowed to IV cefazolin.  Blood cultures 02/20/2021 with gram-positive cocci in  clusters.  Surveillance blood cultures 02/22/2022 negative.  ID recommended MRI thoracic/lumbar spine and TEE.  MRI thoracic showed signal intensity at C6-C7 interspace which could be degenerative vs osteomyelitis.  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  MRI cervical spine with findings suspicious for discitis and osteomyelitis. No epidural abscess.  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 Sharol Given with orthopedics. No surgical intervention indicated at this time.  Does not think foot is cause of sepsis  Elevated troponin   - Per primary team.  Was felt 2/2 setting of sepsis  4. Hypokalemia - using higher k bath with HD   7. ESRD  - On HD TTS schedule.  Thursday and Saturday there were issues due to pt's dementia and agitation.  Hope that this is due to acute illness -  if does not improve he cannot be dialyzed in the OP setting like this as it would not be safe.  Will attempt HD again on Tuesday and hopeful for improvement in agitation - Continue TTS schedule while here for now due to staffing (and to allow him to be dialyzed in the am more consistently here).  On discharge he is to be transitioned to MWF schedule.  Note he is changing to [employer] per charting  - Note final ID recs: Cefazolin 2g/HD-T/Th/Sat for 6 weeks with an end date of 04/08/22              8. Hypertension/volume   - Coreg held on admission - Does not appear fluid overloaded.  now under his EDW-  min UF with HD-     9. Anemia of CKD  - ESA  increased dose to 212mg every Thursday - tsat 10%, ferritin 1700, no IV iron.  - Transfuse if Hbg <7.0   10. Secondary Hyperparathyroidism  - hx hypophosphatemia and prev needed repletion - improved - Not on VDRA or binders.     11. Dispo- would consider another palliative discussion - inpatient or outpatient - he appears to be improving from a behavorial standpoint thankfully.  Stable for discharge from a strictly renal standpoint.  I have reached out to primary team   LGlenwoodKidney Diaz 03/02/2022, 7:20 AM   Addendum:  Contacted by Dr. GMoshe Cipro(she knows pt and would be  the accepting outpatient nephrologist).  She has concerns about dialyzing him as an outpatient given the continued outbursts.   We do recommend palliative care consult   Claudia Desanctis, MD 12:17 PM 03/02/2022

## 2022-03-02 NOTE — Progress Notes (Signed)
Speech Language Pathology Treatment: Dysphagia  Patient Details Name: Darryl Diaz MRN: [id number] DOB: [name] 02, 1941 Today's Date: 03/02/2022 Time: 5462-7035 SLP Time Calculation (min) (ACUTE ONLY): 10 min  Assessment / Plan / Recommendation Clinical Impression  Pt was seen for skilled ST targeting diet tolerance.  Pt appeared to have improved mentation from previous sessions per chart review, and he was encountered upright in bed eating breakfast.  Pt required some set up assistance, but was otherwise able to feed himself.  Pt consumed soft/regular solids and thin liquids via cup without clinical s/sx of aspiration.  Mastication was timely and AP transit and swallow initiation appeared timely with no oral residue noted.  Pt was re-educated regarding compensatory strategies and he verbalized understanding.  Recommend diet upgrade to regular solids and thin liquids with medication administered whole with liquid.  No further skilled ST is warranted at this time.  Please re-consult if additional needs arise.    HPI HPI: Pt is an 81 y/o M presented from SNF for decreased PO Intake, swelling and redness of the left foot.  Recent admit 9/15 after fall at home with C64f, continues to wear C-collar. PMH includes alcohol abuse, ESRD on HD MWF, CHF, HTN, and HLD.      SLP Plan  All goals met;Discharge SLP treatment due to (comment) (met goals)      Recommendations for follow up therapy are one component of a multi-disciplinary discharge planning process, led by the attending physician.  Recommendations may be updated based on patient status, additional functional criteria and insurance authorization.    Recommendations  Diet recommendations: Regular;Thin liquid Liquids provided via: Cup;Straw Medication Administration: Whole meds with liquid Supervision: Intermittent supervision to cue for compensatory strategies Compensations: Slow rate;Small sips/bites;Minimize environmental  distractions Postural Changes and/or Swallow Maneuvers: Seated upright 90 degrees                Oral Care Recommendations: Oral care BID Follow Up Recommendations: Long-term institutional care without follow-up therapy Assistance recommended at discharge: Frequent or constant Supervision/Assistance SLP Visit Diagnosis: Dysphagia, unspecified (R13.10) Plan: All goals met;Discharge SLP treatment due to (comment) (met goals)          MBretta Bang M.S., CVeniceOffice: ([phone] MGore 03/02/2022, 9:02 AM

## 2022-03-02 NOTE — Progress Notes (Signed)
Physical Therapy Treatment Patient Details Name: Darryl Diaz MRN: [id number] DOB: January 24, 1941 Today's Date: 03/02/2022   History of Present Illness Pt is an 81 y/o M presented from SNF for decreased PO Intake, swelling and redness of the left foot.  Pt now with significant low back pain starting on 10/23.Recent admit 9/15 after fall at home with C52f, continues to wear C-collar. PMH includes alcohol abuse, ESRD on HD MWF, CHF, HTN, and HLD.    PT Comments    Pt eager to get EOB and OOB today, pleasant throughout session. Pt overall requiring max 1-2 for to/from EOB, and tolerated x2 stands with max physical and verbal/tactile cuing. Pt with fear of falling and tends to posteriorly lean, correctable with cuing. Pt progressing well towards goals.    Recommendations for follow up therapy are one component of a multi-disciplinary discharge planning process, led by the attending physician.  Recommendations may be updated based on patient status, additional functional criteria and insurance authorization.  Follow Up Recommendations  Skilled nursing-short term rehab (<3 hours/day) Can patient physically be transported by private vehicle: No   Assistance Recommended at Discharge Frequent or constant Supervision/Assistance  Patient can return home with the following Two people to help with walking and/or transfers;A lot of help with bathing/dressing/bathroom;Assistance with cooking/housework;Direct supervision/assist for medications management;Direct supervision/assist for financial management;Assist for transportation;Help with stairs or ramp for entrance   Equipment Recommendations  None recommended by PT    Recommendations for Other Services       Precautions / Restrictions Precautions Precautions: Cervical;Fall Precaution Booklet Issued: No Required Braces or Orthoses: Cervical Brace Cervical Brace: Hard collar;At all times Restrictions Weight Bearing Restrictions: No      Mobility  Bed Mobility Overal bed mobility: Needs Assistance Bed Mobility: Supine to Sit     Supine to sit: Max assist, HOB elevated     General bed mobility comments: assist for completing LE translation to EOB, trunk elevation    Transfers Overall transfer level: Needs assistance Equipment used: Rolling walker (2 wheels) Transfers: Sit to/from Stand Sit to Stand: Max assist, +2 physical assistance           General transfer comment: max +2 for power up, rise, anterior truncal translation, and steadying. STS x2 from EOB, standing tolerance on second stand x1 min    Ambulation/Gait               General Gait Details: nt   SMarine scientistRankin (Stroke Patients Only)       Balance Overall balance assessment: Needs assistance Sitting-balance support: Feet supported, Bilateral upper extremity supported Sitting balance-Leahy Scale: Fair Sitting balance - Comments: tolerates AP leaning and LE exercise with UE propping Postural control: Posterior lean Standing balance support: Bilateral upper extremity supported, During functional activity Standing balance-Leahy Scale: Zero Standing balance comment: max                            Cognition Arousal/Alertness: Awake/alert Behavior During Therapy: WFL for tasks assessed/performed Overall Cognitive Status: No family/caregiver present to determine baseline cognitive functioning                                 General Comments: Pt pleasant and eager to mobilize today, oriented to self and location but is perseverative on  doing his taxes throughout session. Follows one-step commands with tactile cuing well        Exercises General Exercises - Lower Extremity Long Arc Quad: AROM, Both, 10 reps, Seated    General Comments        Pertinent Vitals/Pain Pain Assessment Pain Assessment: Faces Faces Pain Scale: Hurts little more Pain  Location: generalized Pain Descriptors / Indicators: Grimacing, Guarding, Aching Pain Intervention(s): Limited activity within patient's tolerance, Monitored during session, Repositioned    Home Living                          Prior Function            PT Goals (current goals can now be found in the care plan section) Acute Rehab PT Goals Patient Stated Goal: to get back to bed and comfortable PT Goal Formulation: Patient unable to participate in goal setting Time For Goal Achievement: 03-25-2022 Potential to Achieve Goals: Fair Progress towards PT goals: Progressing toward goals    Frequency    Min 3X/week      PT Plan Current plan remains appropriate    Co-evaluation PT/OT/SLP Co-Evaluation/Treatment:  (dovetail)            AM-PAC PT "6 Clicks" Mobility   Outcome Measure  Help needed turning from your back to your side while in a flat bed without using bedrails?: A Lot Help needed moving from lying on your back to sitting on the side of a flat bed without using bedrails?: A Lot Help needed moving to and from a bed to a chair (including a wheelchair)?: Total Help needed standing up from a chair using your arms (e.g., wheelchair or bedside chair)?: A Lot Help needed to walk in hospital room?: Total Help needed climbing 3-5 steps with a railing? : Total 6 Click Score: 9    End of Session   Activity Tolerance: Patient tolerated treatment well;Patient limited by fatigue Patient left: in bed;with bed alarm set;with call bell/phone within reach;Other (comment) (OT had pt on EOB, OT session continuing) Nurse Communication: Mobility status PT Visit Diagnosis: Muscle weakness (generalized) (M62.81);History of falling (Z91.81);Difficulty in walking, not elsewhere classified (R26.2);Pain     Time: 0569-7948 PT Time Calculation (min) (ACUTE ONLY): 14 min  Charges:  $Therapeutic Activity: 8-22 mins                     Stacie Glaze, PT DPT Acute Rehabilitation  Services Pager [phone]  Office [phone]    Alma E Ruffin Pyo 03/02/2022, 11:24 AM

## 2022-03-03 DIAGNOSIS — A419 Sepsis, unspecified organism: Secondary | ICD-10-CM | POA: Diagnosis not present

## 2022-03-03 DIAGNOSIS — I2510 Atherosclerotic heart disease of native coronary artery without angina pectoris: Secondary | ICD-10-CM | POA: Diagnosis not present

## 2022-03-03 DIAGNOSIS — R6520 Severe sepsis without septic shock: Secondary | ICD-10-CM | POA: Diagnosis not present

## 2022-03-03 DIAGNOSIS — Z7189 Other specified counseling: Secondary | ICD-10-CM

## 2022-03-03 LAB — RENAL FUNCTION PANEL
Albumin: 1.8 g/dL — ABNORMAL LOW (ref 3.5–5.0)
Anion gap: 10 (ref 5–15)
BUN: 32 mg/dL — ABNORMAL HIGH (ref 8–23)
CO2: 27 mmol/L (ref 22–32)
Calcium: 7.7 mg/dL — ABNORMAL LOW (ref 8.9–10.3)
Chloride: 96 mmol/L — ABNORMAL LOW (ref 98–111)
Creatinine, Ser: 3.02 mg/dL — ABNORMAL HIGH (ref 0.61–1.24)
GFR, Estimated: 20 mL/min — ABNORMAL LOW (ref >60)
Glucose, Bld: 102 mg/dL — ABNORMAL HIGH (ref 70–99)
Phosphorus: 4 mg/dL (ref 2.5–4.6)
Potassium: 4.4 mmol/L (ref 3.5–5.1)
Sodium: 133 mmol/L — ABNORMAL LOW (ref 135–145)

## 2022-03-03 MED ORDER — DARBEPOETIN ALFA 200 MCG/0.4ML IJ SOSY
200.0000 ug | PREFILLED_SYRINGE | INTRAMUSCULAR | Status: DC
  Administered 2022-03-03: 200 ug via SUBCUTANEOUS
  Filled 2022-03-03: qty 0.4

## 2022-03-03 NOTE — Progress Notes (Addendum)
[facility] Progress Note   Subjective:    Last HD on 10/24 with 1.4 kg UF.  Per a chart note he did start yelling at the end of dialysis and his treatment was stopped 13 minutes early per charting.  Outpatient nephrology is concerned about his ability to safely dialyze in the outpatient setting and we requested a palliative care consult to help guide goals of care discussions. I spoke with his daughter and she states that he is an alcoholic which also complicates things.  She asked that I also reach out to her brother.  His daughter shared that she is in the medical field and has been "expecting these phone calls for about a year and a half".  They are trying to get her father's will sorted out.  I called the patient's son, Linna Hoff, and left a voicemail message that I would call him back to talk about his dad.   Review of systems:    Denies shortness of breath or chest pain Denies n/v/d  Objective Vitals:   03/02/22 0752 03/02/22 1639 03/02/22 2059 03/03/22 0326  BP: 131/60 137/63 (!) 142/60 132/68  Pulse: 64 72 81 63  Resp: '16 16 18 18  '$ Temp: 98 F (36.7 C) 98.5 F (36.9 C) 99.3 F (37.4 C) 98.3 F (36.8 C)  TempSrc: Oral Oral Oral Oral  SpO2: 97% 96% 94% 96%  Weight:      Height:       Physical Exam       General elderly male in bed in no acute distress HEENT normocephalic atraumatic extraocular movements intact sclera anicteric Neck supple trachea midline Lungs clear to auscultation bilaterally normal work of breathing at rest  Heart S1S2 no rub Abdomen soft nontender nondistended Extremities no edema  Psych normal mood and affect; very pleasant Neuro - awake on arrival and oriented to person and location but not to year (2025, then 2015).  Access LUE AVF bruit and thrill    Filed Weights   02/21/22 1959 03/01/22 0815 03/01/22 1220  Weight: 77.6 kg 86.8 kg 85.4 kg    Intake/Output Summary (Last 24 hours) at 03/03/2022 4010 Last data filed at 03/03/2022  0526 Gross per 24 hour  Intake 610 ml  Output 750 ml  Net -140 ml     Additional Objective Labs: Basic Metabolic Panel: Recent Labs  Lab 02/28/22 0425 03/01/22 0514 03/03/22 0438  NA 134* 134* 133*  K 4.1 4.1 4.4  CL 98 98 96*  CO2 '28 25 27  '$ GLUCOSE 100* 99 102*  BUN 31* 39* 32*  CREATININE 2.80* 3.18* 3.02*  CALCIUM 7.9* 7.7* 7.7*  PHOS 3.9 4.2 4.0   Liver Function Tests: Recent Labs  Lab 02/28/22 0425 03/01/22 0514 03/03/22 0438  ALBUMIN 1.8* 1.8* 1.8*   CBC: Recent Labs  Lab 02/25/22 0322 03/01/22 0514  WBC 5.5 7.9  HGB 7.5* 7.7*  HCT 22.1* 22.9*  MCV 101.8* 102.2*  PLT 60* 171   Blood Culture    Component Value Date/Time   SDES BLOOD RIGHT ANTECUBITAL 02/25/2022 0830   SPECREQUEST  02/25/2022 0830    BOTTLES DRAWN AEROBIC AND ANAEROBIC Blood Culture adequate volume   CULT  02/25/2022 0830    NO GROWTH 5 DAYS Performed at [facility], [place] [address], [place], [place] [address]    REPTSTATUS 03/02/2022 FINAL 02/25/2022 0830   Cardiac Enzymes: No results for input(s): "CKTOTAL", "CKMB", "CKMBINDEX", "TROPONINI" in the last 168 hours.  Iron Studies:  No  results for input(s): "IRON", "TIBC", "TRANSFERRIN", "FERRITIN" in the last 72 hours.  Lab Results  Component Value Date   INR 1.4 (H) 02/22/2022   INR 1.4 (H) 02/21/2022   INR 1.1 11/23/2021   Studies/Results: No results found.  Medications:  sodium chloride      ceFAZolin (ANCEF) IV 2 g (03/01/22 1803)    amiodarone  200 mg Oral q morning   carvedilol  3.125 mg Oral BID WC   Chlorhexidine Gluconate Cloth  6 each Topical Q0600   darbepoetin (ARANESP) injection - DIALYSIS  200 mcg Intravenous Q Thu-HD   enoxaparin (LOVENOX) injection  30 mg Subcutaneous Daily   escitalopram  10 mg Oral q morning   feeding supplement  237 mL Oral BID BM   lidocaine  3 patch Transdermal Q24H   sodium chloride flush  3 mL Intravenous Q12H   tamsulosin  0.4 mg Oral QHS    Dialysis Orders: TTS  - NW 4hrs, BFR 400/AF 1.5,  EDW 79.8 kg, 3.0 K/ 2.5 Ca   Access: LU AVF  No Heparin  Mircera 150 mcg q2wks - last 9/27  Assessment/Plan: Sepsis due to gram-positive bacteremia in setting of left foot cellulitis and left foot osteomyelitis -CT with likely PNA.  Started on broad spectrum ABX. ID consulted and ABX narrowed to IV cefazolin.  Blood cultures 02/20/2021 with gram-positive cocci in clusters.  Surveillance blood cultures 02/22/2022 negative.  ID recommended MRI thoracic/lumbar spine and TEE.  MRI thoracic showed signal intensity at C6-C7 interspace which could be degenerative vs osteomyelitis.  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  MRI cervical spine with findings suspicious for discitis and osteomyelitis. No epidural abscess.  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 Sharol Given with orthopedics. No surgical intervention indicated at this time.  Does not think foot is cause of sepsis  Elevated troponin   - Per primary team.  Was felt 2/2 setting of sepsis  4. Hypokalemia - improved; follow K with HD   7. ESRD  - On HD TTS schedule here due to staffing (and to allow him to be dialyzed in the am more consistently here given dementia).   - Nephrology has concerns about him being able to be dialyzed in the outpatient setting due to his outbursts related to his dementia.  We have requested a palliative care consult.   - On discharge he is to be tentatively transitioned to MWF schedule at [facility] per charting (should his behavioral outbursts with HD improve) - Note final ID recs as above: Cefazolin 2g three times a week with HD for 6 weeks with an end date of 04/08/22              8. Hypertension/volume   - acceptable     9. Anemia of CKD  - ESA  increased dose to 268mg every Thursday - tsat 10%, ferritin 1700, no IV iron in setting of infection  - Transfuse if Hbg <7.0   10. Secondary Hyperparathyroidism  - hx hypophosphatemia and prev needed repletion - improved - Not on VDRA or binders.    11. Dispo-  we have requested a palliative consult as nephrology is concerned that we will not be  able to safely dialyze him in the outpatient setting given his continued outbursts with HD and the level of monitoring required with HD secondary to same    [place] 03/03/2022, 7:34 AM   Addendum:  Damaris Schooner with HD charge RN.  On Tuesday near the end of treatment the patient was yelling, took off his gown and took off his C-collar.  Treatment was terminated.  I was not notified at that time.  We are not able to safely dialyze him as an outpatient given his continued behavioral outbursts  Claudia Desanctis, MD 10:08 AM 03/03/2022

## 2022-03-03 NOTE — Discharge Instructions (Signed)
Darryl Diaz was admitted to the Hospital on 02/21/2022 and Discharged on Discharge Date 03/03/2022 and should be excused from work/school   for 3  days starting 02/21/2022 , may return to work/school without any restrictions.  Call Bess Harvest MD, [facility] [phone] with questions.  Charlynne Cousins M.D on 03/03/2022,at 12:08 PM  [facility] Office  [phone]

## 2022-03-03 NOTE — Progress Notes (Signed)
[facility] PROGRESS NOTE    Progress Note  Darryl Diaz  NAT:[id number] DOB: August 15, 1940 DOA: 02/21/2022 PCP: [name], MD     Brief Narrative:   Darryl Diaz is an 81 y.o. 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  Assessment/Plan:   Sepsis due to gram-positive bacteremia in the setting discitis and osteomyelitis of C6-C7: Blood cultures in 02/20/2022, 02/21/2022, 02/22/2022 showed MSSA bacteremia. Blood cultures repeated on 02/25/2022 are negative till date. MRI of the C-spine showed discitis and osteomyelitis with possible abscess C6-C7 ID was consulted now has been transitioned to IV cefazolin.   TEE cannot be performed due to his C1 articular facet fracture. Physical therapy evaluated the patient, he will need to go to skilled nursing facility. 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  Hypokalemia: Nephrology has been consulted, further management per hemodialysis.  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 Hemoglobin has remained relatively stable at 7.5-7.6.  C1 articular facet fracture: Currently in a c-collar. Cardiology will not proceed with TEE  Elevated troponins: In the  setting of sepsis and end-stage renal disease, likely demand ischemia he denies any chest pain twelve-lead EKG showed no signs of ischemia.  Mild aortic stenosis: Mean gradient of 12 continue to monitor as an outpatient.  Mild hyponatremia: Further management per renal with dialysis. Now resolved.  Diarrhea: Has had no further diarrhea in house.  Paroxysmal atrial fibrillation: On anticoagulation rate control continue amiodarone.  Essential hypertension: Blood pressures well controlled continue current regimen.  Hyperlipidemia: Continue statins.  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  Prolonged QTc: Noted continue to monitor on telemetry.  End-stage renal disease: Further HD per renal. Renal requested that palliative Care be consulted as the patient is not tolerating HD as an outpatient.    DVT prophylaxis: lovenox Family Communication:none Status is: Inpatient Remains inpatient appropriate because: Likely back to SNF after infectious work-up and treatment has been completed.    Code Status:     Code Status Orders  (From admission, onward)           Start     Ordered   02/22/22 0243  Do not attempt resuscitation (DNR)  Continuous       Question Answer Comment  In the event of cardiac or respiratory ARREST Do not call a "code blue"   In the event of cardiac or respiratory ARREST Do not perform Intubation, CPR, defibrillation or ACLS   In the event of cardiac or respiratory ARREST Use medication by any route, position, wound care, and other measures to relive pain and suffering. May use oxygen, suction and manual treatment of airway obstruction as needed for comfort.      02/22/22 0242  Code Status History     Date Active Date Inactive Code Status Order ID Comments User Context   01/21/2022 0603 01/27/2022 1811 DNR [id number]  Shela Leff, MD ED   11/24/2021 0007  11/26/2021 2014 DNR [id number]  Rhetta Mura, DO ED   09/27/2021 2202 09/30/2021 2305 DNR [id number]  Rhetta Mura, DO ED   06/07/2021 2050 06/14/2021 2022 DNR [id number]  Rhetta Mura, DO ED   05/02/2021 0353 05/02/2021 2111 DNR [id number]  Elwyn Reach, MD Inpatient   01/17/2021 0623 01/22/2021 2354 DNR [id number]  Reubin Milan, MD ED   01/06/2021 2258 01/09/2021 2208 DNR [id number]  Lenore Cordia, MD ED   12/02/2020 1537 12/04/2020 1641 Full Code [id number]  Karmen Bongo, MD ED   10/13/2020 2222 10/17/2020 2016 Full Code [id number]  Chotiner, Yevonne Aline, MD Inpatient   09/07/2020 2215 09/13/2020 2142 Full Code [id number]  Lenore Cordia, MD ED   03/16/2020 0811 03/20/2020 1908 Full Code [id number]  Karmen Bongo, MD ED   03/06/2020 0100 03/11/2020 1501 Full Code [id number]  Cathlyn Parsons, PA-C Inpatient   03/06/2020 0100 03/06/2020 0100 Full Code [id number]  Cathlyn Parsons, PA-C Inpatient   02/22/2020 1704 03/06/2020 0029 Full Code [id number]  Freada Bergeron, MD ED   02/05/2020 2210 02/08/2020 0218 Full Code [id number]  Barrett, Evelene Croon, PA-C ED   10/15/2019 1204 10/16/2019 1922 Full Code [id number]  Norman Herrlich Inpatient   04/20/2019 1844 04/23/2019 1723 Full Code [id number]  Donita Brooks, NP ED   02/25/2019 2021 03/05/2019 1432 Full Code [id number]  Shela Leff, MD ED   08/11/2018 1707 08/12/2018 1446 Full Code [id number]  Barb Merino, MD Inpatient   04/30/2016 0253 05/04/2016 1828 Full Code [id number]  Wylene Simmer, MD Inpatient   03/30/2016 1347 04/04/2016 1440 Full Code [id number]  Rondel Jumbo PA-C ED   03/30/2016 1347 03/30/2016 1347 Full Code [id number]  Rondel Jumbo, PA-C ED         IV Access:   Peripheral IV   Procedures and diagnostic studies:   No results found.   Medical Consultants:   None.   Subjective:    Darryl Diaz no complaints today  Objective:    Vitals:   03/02/22 0752 03/02/22 1639 03/02/22 2059 03/03/22  0326  BP: 131/60 137/63 (!) 142/60 132/68  Pulse: 64 72 81 63  Resp: '16 16 18 18  '$ Temp: 98 F (36.7 C) 98.5 F (36.9 C) 99.3 F (37.4 C) 98.3 F (36.8 C)  TempSrc: Oral Oral Oral Oral  SpO2: 97% 96% 94% 96%  Weight:      Height:       SpO2: 96 %   Intake/Output Summary (Last 24 hours) at 03/03/2022 1052 Last data filed at 03/03/2022 0826 Gross per 24 hour  Intake 610 ml  Output 750 ml  Net -140 ml    Filed Weights   02/21/22 1959 03/01/22 0815 03/01/22 1220  Weight: 77.6 kg 86.8 kg 85.4 kg    Exam: General exam: In no acute distress, c-collar in place Respiratory system: Good air movement and clear to auscultation. Cardiovascular system: S1 & S2 heard, RRR. No JVD. Gastrointestinal system: Abdomen is nondistended, soft and nontender.  Psychiatry: Judgement and insight appear normal. Mood & affect appropriate.  Data Reviewed:    Labs: Basic Metabolic Panel: Recent Labs  Lab 02/26/22 1011 02/27/22 0400 02/28/22 0425 03/01/22 0514 03/03/22 0438  NA 135 139 134* 134* 133*  K 3.3* 3.8  4.1 4.1 4.4  CL 99 102 98 98 96*  CO2 '28 30 28 25 27  '$ GLUCOSE 121* 104* 100* 99 102*  BUN 38* 19 31* 39* 32*  CREATININE 3.03* 2.08* 2.80* 3.18* 3.02*  CALCIUM 7.6* 7.8* 7.9* 7.7* 7.7*  MG 1.7  --   --   --   --   PHOS 2.6 2.4* 3.9 4.2 4.0    GFR Estimated Creatinine Clearance: 21.4 mL/min (A) (by C-G formula based on SCr of 3.02 mg/dL (H)). Liver Function Tests: Recent Labs  Lab 02/26/22 1011 02/27/22 0400 02/28/22 0425 03/01/22 0514 03/03/22 0438  ALBUMIN 1.9* 1.8* 1.8* 1.8* 1.8*    No results for input(s): "LIPASE", "AMYLASE" in the last 168 hours. No results for input(s): "AMMONIA" in the last 168 hours.  Coagulation profile No results for input(s): "INR", "PROTIME" in the last 168 hours.  COVID-19 Labs  No results for input(s): "DDIMER", "FERRITIN", "LDH", "CRP" in the last 72 hours.   Lab Results  Component Value Date   SARSCOV2NAA NEGATIVE  02/22/2022   SARSCOV2NAA NEGATIVE 02/21/2022   SARSCOV2NAA NEGATIVE 12/20/2021   SARSCOV2NAA POSITIVE (A) 06/03/2021    CBC: Recent Labs  Lab 02/25/22 0322 03/01/22 0514  WBC 5.5 7.9  HGB 7.5* 7.7*  HCT 22.1* 22.9*  MCV 101.8* 102.2*  PLT 60* 171    Cardiac Enzymes: No results for input(s): "CKTOTAL", "CKMB", "CKMBINDEX", "TROPONINI" in the last 168 hours.  BNP (last 3 results) No results for input(s): "PROBNP" in the last 8760 hours. CBG: Recent Labs  Lab 02/25/22 0824 02/25/22 1200 02/27/22 1201 02/27/22 1648  GLUCAP 106* 139* 123* 115*    D-Dimer: No results for input(s): "DDIMER" in the last 72 hours. Hgb A1c: No results for input(s): "HGBA1C" in the last 72 hours. Lipid Profile: No results for input(s): "CHOL", "HDL", "LDLCALC", "TRIG", "CHOLHDL", "LDLDIRECT" in the last 72 hours. Thyroid function studies: No results for input(s): "TSH", "T4TOTAL", "T3FREE", "THYROIDAB" in the last 72 hours.  Invalid input(s): "FREET3"  Anemia work up: No results for input(s): "VITAMINB12", "FOLATE", "FERRITIN", "TIBC", "IRON", "RETICCTPCT" in the last 72 hours.  Sepsis Labs: Recent Labs  Lab 02/25/22 0322 03/01/22 0514  WBC 5.5 7.9    Microbiology Recent Results (from the past 240 hour(s))  Blood Culture (routine x 2)     Status: Abnormal   Collection Time: 02/21/22  8:42 PM   Specimen: BLOOD RIGHT FOREARM  Result Value Ref Range Status   Specimen Description BLOOD RIGHT FOREARM  Final   Special Requests   Final    BOTTLES DRAWN AEROBIC AND ANAEROBIC Blood Culture adequate volume   Culture  Setup Time   Final    GRAM POSITIVE COCCI IN CLUSTERS IN BOTH AEROBIC AND ANAEROBIC BOTTLES CRITICAL VALUE NOTED.  VALUE IS CONSISTENT WITH PREVIOUSLY REPORTED AND CALLED VALUE.    Culture (A)  Final    STAPHYLOCOCCUS AUREUS SUSCEPTIBILITIES PERFORMED ON PREVIOUS CULTURE WITHIN THE LAST 5 DAYS. Performed at [facility], [place] [address], [place], [place]  [address]    Report Status 02/24/2022 FINAL  Final  Blood Culture (routine x 2)     Status: Abnormal   Collection Time: 02/21/22  8:49 PM   Specimen: BLOOD  Result Value Ref Range Status   Specimen Description BLOOD RIGHT ANTECUBITAL  Final   Special Requests   Final    BOTTLES DRAWN AEROBIC AND ANAEROBIC Blood Culture adequate volume   Culture  Setup Time   Final    GRAM  POSITIVE COCCI IN CLUSTERS IN BOTH AEROBIC AND ANAEROBIC BOTTLES CRITICAL RESULT CALLED TO, READ BACK BY AND VERIFIED WITH: PHARMD E [place] [id number] AT 1234 BY CM    Culture (A)  Final    STAPHYLOCOCCUS AUREUS SUSCEPTIBILITIES PERFORMED ON PREVIOUS CULTURE WITHIN THE LAST 5 DAYS. Performed at [facility], [place] [address] [place], [place] [address]    Report Status 02/24/2022 FINAL  Final  Urine Culture     Status: Abnormal   Collection Time: 02/22/22  3:28 AM   Specimen: Urine, Clean Catch  Result Value Ref Range Status   Specimen Description URINE, CLEAN CATCH  Final   Special Requests NONE  Final   Culture (A)  Final    <10,000 COLONIES/mL INSIGNIFICANT GROWTH Performed at [facility], [place] [address], [place], [place] [address]    Report Status 02/23/2022 FINAL  Final  Resp Panel by RT-PCR (Flu A&B, Covid) Anterior Nasal Swab     Status: None   Collection Time: 02/22/22  3:28 AM   Specimen: Anterior Nasal Swab  Result Value Ref Range Status   SARS Coronavirus 2 by RT PCR NEGATIVE NEGATIVE Final    Comment: (NOTE) SARS-CoV-2 target nucleic acids are NOT DETECTED.  The SARS-CoV-2 RNA is generally detectable in upper respiratory specimens during the acute phase of infection. The lowest concentration of SARS-CoV-2 viral copies this assay can detect is 138 copies/mL. A negative result does not preclude SARS-Cov-2 infection and should not be used as the sole basis for treatment or other patient management decisions. A negative result may occur with  improper specimen collection/handling,  submission of specimen other than nasopharyngeal swab, presence of viral mutation(s) within the areas targeted by this assay, and inadequate number of viral copies(<138 copies/mL). A negative result must be combined with clinical observations, patient history, and epidemiological information. The expected result is Negative.  Fact Sheet for Patients:  EntrepreneurPulse.com.au  Fact Sheet for Healthcare Providers:  IncredibleEmployment.be  This test is no t yet approved or cleared by the [place] FDA and  has been authorized for detection and/or diagnosis of SARS-CoV-2 by FDA under an Emergency Use Authorization (EUA). This EUA will remain  in effect (meaning this test can be used) for the duration of the COVID-19 declaration under Section 564(b)(1) of the Act, 21 U.S.C.section 360bbb-3(b)(1), unless the authorization is terminated  or revoked sooner.       Influenza A by PCR NEGATIVE NEGATIVE Final   Influenza B by PCR NEGATIVE NEGATIVE Final    Comment: (NOTE) The Xpert Xpress SARS-CoV-2/FLU/RSV plus assay is intended as an aid in the diagnosis of influenza from Nasopharyngeal swab specimens and should not be used as a sole basis for treatment. Nasal washings and aspirates are unacceptable for Xpert Xpress SARS-CoV-2/FLU/RSV testing.  Fact Sheet for Patients: EntrepreneurPulse.com.au  Fact Sheet for Healthcare Providers: IncredibleEmployment.be  This test is not yet approved or cleared by the [place] FDA and has been authorized for detection and/or diagnosis of SARS-CoV-2 by FDA under an Emergency Use Authorization (EUA). This EUA will remain in effect (meaning this test can be used) for the duration of the COVID-19 declaration under Section 564(b)(1) of the Act, 21 U.S.C. section 360bbb-3(b)(1), unless the authorization is terminated or revoked.  Performed at [facility], [place] [address], [place], [place] [address]   Respiratory (~20 pathogens) panel by PCR     Status: None   Collection Time: 02/22/22  3:28 AM   Specimen: Urine,  Clean Catch; Respiratory  Result Value Ref Range Status   Adenovirus NOT DETECTED NOT DETECTED Final   Coronavirus 229E NOT DETECTED NOT DETECTED Final    Comment: (NOTE) The Coronavirus on the Respiratory Panel, DOES NOT test for the novel  Coronavirus (2019 nCoV)    Coronavirus HKU1 NOT DETECTED NOT DETECTED Final   Coronavirus NL63 NOT DETECTED NOT DETECTED Final   Coronavirus OC43 NOT DETECTED NOT DETECTED Final   Metapneumovirus NOT DETECTED NOT DETECTED Final   Rhinovirus / Enterovirus NOT DETECTED NOT DETECTED Final   Influenza A NOT DETECTED NOT DETECTED Final   Influenza B NOT DETECTED NOT DETECTED Final   Parainfluenza Virus 1 NOT DETECTED NOT DETECTED Final   Parainfluenza Virus 2 NOT DETECTED NOT DETECTED Final   Parainfluenza Virus 3 NOT DETECTED NOT DETECTED Final   Parainfluenza Virus 4 NOT DETECTED NOT DETECTED Final   Respiratory Syncytial Virus NOT DETECTED NOT DETECTED Final   Bordetella pertussis NOT DETECTED NOT DETECTED Final   Bordetella Parapertussis NOT DETECTED NOT DETECTED Final   Chlamydophila pneumoniae NOT DETECTED NOT DETECTED Final   Mycoplasma pneumoniae NOT DETECTED NOT DETECTED Final    Comment: Performed at [facility], [place] [address] [place], [place] [address]  MRSA Next Gen by PCR, Nasal     Status: None   Collection Time: 02/22/22  3:33 AM   Specimen: STOOL; Nasal Swab  Result Value Ref Range Status   MRSA by PCR Next Gen NOT DETECTED NOT DETECTED Final    Comment: (NOTE) The GeneXpert MRSA Assay (FDA approved for NASAL specimens only), is one component of a comprehensive MRSA colonization surveillance program. It is not intended to diagnose MRSA infection nor to guide or monitor treatment for MRSA infections. Test performance is not FDA approved in patients less than 71  years old. Performed at [facility], [place] [address], [place], [place] [address]   Culture, blood (Routine X 2) w Reflex to ID Panel     Status: Abnormal   Collection Time: 02/23/22  2:43 AM   Specimen: BLOOD  Result Value Ref Range Status   Specimen Description BLOOD SITE NOT SPECIFIED  Final   Special Requests   Final    BOTTLES DRAWN AEROBIC AND ANAEROBIC Blood Culture results may not be optimal due to an inadequate volume of blood received in culture bottles   Culture  Setup Time   Final    GRAM POSITIVE COCCI IN CLUSTERS AEROBIC BOTTLE ONLY CRITICAL RESULT CALLED TO, READ BACK BY AND VERIFIED WITH: PHARMD MICHAEL Harvey ON 02/23/22 @ 2027 BY DRT    Culture (A)  Final    STAPHYLOCOCCUS AUREUS SUSCEPTIBILITIES PERFORMED ON PREVIOUS CULTURE WITHIN THE LAST 5 DAYS. Performed at [facility], [place] [address] [place], [place] [address]    Report Status 02/24/2022 FINAL  Final  Culture, blood (Routine X 2) w Reflex to ID Panel     Status: None   Collection Time: 02/23/22  2:43 AM   Specimen: BLOOD  Result Value Ref Range Status   Specimen Description BLOOD SITE NOT SPECIFIED  Final   Special Requests   Final    BOTTLES DRAWN AEROBIC AND ANAEROBIC Blood Culture adequate volume   Culture   Final    NO GROWTH 5 DAYS Performed at [facility], [place] [address] [place], [place] [address]    Report Status 02/28/2022 FINAL  Final  Culture, blood (Routine X 2) w Reflex to ID Panel  Status: None   Collection Time: 02/25/22  8:23 AM   Specimen: BLOOD RIGHT ARM  Result Value Ref Range Status   Specimen Description BLOOD RIGHT ARM  Final   Special Requests   Final    BOTTLES DRAWN AEROBIC AND ANAEROBIC Blood Culture adequate volume   Culture   Final    NO GROWTH 5 DAYS Performed at [facility], [address] [place], [place] [address]    Report Status 03/02/2022 FINAL  Final  Culture, blood (Routine X 2) w Reflex to ID Panel     Status: None    Collection Time: 02/25/22  8:30 AM   Specimen: BLOOD  Result Value Ref Range Status   Specimen Description BLOOD RIGHT ANTECUBITAL  Final   Special Requests   Final    BOTTLES DRAWN AEROBIC AND ANAEROBIC Blood Culture adequate volume   Culture   Final    NO GROWTH 5 DAYS Performed at [facility], [place] [address] [place], [place] [address]    Report Status 03/02/2022 FINAL  Final     Medications:    amiodarone  200 mg Oral q morning   carvedilol  3.125 mg Oral BID WC   Chlorhexidine Gluconate Cloth  6 each Topical Q0600   darbepoetin (ARANESP) injection - DIALYSIS  200 mcg Subcutaneous Q Thu-1800   enoxaparin (LOVENOX) injection  30 mg Subcutaneous Daily   escitalopram  10 mg Oral q morning   feeding supplement  237 mL Oral BID BM   lidocaine  3 patch Transdermal Q24H   sodium chloride flush  3 mL Intravenous Q12H   tamsulosin  0.4 mg Oral QHS   Continuous Infusions:  sodium chloride      ceFAZolin (ANCEF) IV 2 g (03/01/22 1803)      LOS: 9 days   Charlynne Cousins  [facility]  03/03/2022, 10:52 AM

## 2022-03-03 NOTE — Progress Notes (Signed)
Palliative-   Consult received, chart reviewed. Unfortunately, patient was at HD and unable to be examined. Palliative consulted due to concerns from nephrology that patient cannot safely receive dialysis in the outpatient setting.   I called and spoke with patient's daughter- Darryl Diaz. Introduced Palliative. Joelene Millin shared that she and her brother had spoken last evening and they do believe there is an element of suffering that her Dad is experiencing. She would like further discussions regarding his plan of care to be held with her brother Darryl Diaz.   I called Darryl Diaz. Discussed concerns that nephrology has. Darryl Diaz asked about prognosis if patient stops dialysis- I shared best guess would be a matter of weeks. Discussed utilizing comfort and symptom management.   Darryl Diaz agreed to in person meeting tomorrow at 2pm for further discussion.   Darryl Diaz, AGNP-C Palliative Medicine  No charge

## 2022-03-03 NOTE — Progress Notes (Signed)
Received patient in bed to unit.  Alert and oriented.  Informed consent signed and in chart.   Treatment initiated: 7943 Treatment completed: 1627  Patient tolerated well.  Transported back to the room  Alert, without acute distress.  Hand-off given to patient's nurse.   Access used: fistula Access issues: none  Total UF removed: 2000 Medication(s) given: none Post HD VS: 98.4, 123/61(80), HR-68, RR-15, SP02-95 Post HD weight: 81.7kg   [facility]

## 2022-03-04 DIAGNOSIS — R6520 Severe sepsis without septic shock: Secondary | ICD-10-CM | POA: Diagnosis not present

## 2022-03-04 DIAGNOSIS — F03B18 Unspecified dementia, moderate, with other behavioral disturbance: Secondary | ICD-10-CM | POA: Diagnosis not present

## 2022-03-04 DIAGNOSIS — Z515 Encounter for palliative care: Secondary | ICD-10-CM

## 2022-03-04 DIAGNOSIS — N186 End stage renal disease: Secondary | ICD-10-CM | POA: Diagnosis not present

## 2022-03-04 DIAGNOSIS — A419 Sepsis, unspecified organism: Secondary | ICD-10-CM | POA: Diagnosis not present

## 2022-03-04 DIAGNOSIS — Z7189 Other specified counseling: Secondary | ICD-10-CM | POA: Diagnosis not present

## 2022-03-04 DIAGNOSIS — I2510 Atherosclerotic heart disease of native coronary artery without angina pectoris: Secondary | ICD-10-CM | POA: Diagnosis not present

## 2022-03-04 MED ORDER — GLYCOPYRROLATE 0.2 MG/ML IJ SOLN: 0.2000 mg | INTRAMUSCULAR | Status: DC | PRN

## 2022-03-04 MED ORDER — LORAZEPAM 2 MG/ML PO CONC: 1.0000 mg | ORAL | Status: DC | PRN

## 2022-03-04 MED ORDER — RENA-VITE PO TABS: 1.0000 | ORAL_TABLET | Freq: Every day | ORAL | Status: DC

## 2022-03-04 MED ORDER — BIOTENE DRY MOUTH MT LIQD: 15.0000 mL | OROMUCOSAL | Status: DC | PRN

## 2022-03-04 MED ORDER — OXYCODONE HCL 20 MG/ML PO CONC
5.0000 mg | ORAL | Status: DC | PRN
  Administered 2022-03-05: 5 mg via SUBLINGUAL

## 2022-03-04 MED ORDER — OXYCODONE HCL 20 MG/ML PO CONC
5.0000 mg | ORAL | Status: DC | PRN
  Filled 2022-03-04: qty 0.5

## 2022-03-04 MED ORDER — LORAZEPAM 2 MG/ML IJ SOLN: 1.0000 mg | INTRAMUSCULAR | Status: DC | PRN

## 2022-03-04 MED ORDER — HALOPERIDOL 0.5 MG PO TABS
0.5000 mg | ORAL_TABLET | ORAL | Status: DC | PRN
  Filled 2022-03-04 (×2): qty 1

## 2022-03-04 MED ORDER — ONDANSETRON 4 MG PO TBDP: 4.0000 mg | ORAL_TABLET | Freq: Four times a day (QID) | ORAL | Status: DC | PRN

## 2022-03-04 MED ORDER — HYDROMORPHONE HCL 1 MG/ML IJ SOLN
0.5000 mg | INTRAMUSCULAR | Status: DC | PRN
  Administered 2022-03-04: 0.5 mg via INTRAVENOUS
  Filled 2022-03-04: qty 0.5

## 2022-03-04 MED ORDER — GLYCOPYRROLATE 1 MG PO TABS: 1.0000 mg | ORAL_TABLET | ORAL | Status: DC | PRN

## 2022-03-04 MED ORDER — HALOPERIDOL LACTATE 2 MG/ML PO CONC
0.5000 mg | ORAL | Status: DC | PRN
  Filled 2022-03-04: qty 5

## 2022-03-04 MED ORDER — HALOPERIDOL LACTATE 5 MG/ML IJ SOLN: 0.5000 mg | INTRAMUSCULAR | Status: DC | PRN

## 2022-03-04 MED ORDER — POLYVINYL ALCOHOL 1.4 % OP SOLN: 1.0000 [drp] | Freq: Four times a day (QID) | OPHTHALMIC | Status: DC | PRN

## 2022-03-04 MED ORDER — LORAZEPAM 1 MG PO TABS
1.0000 mg | ORAL_TABLET | ORAL | Status: DC | PRN
  Administered 2022-03-04 – 2022-03-05 (×2): 1 mg via ORAL
  Filled 2022-03-04 (×2): qty 1

## 2022-03-04 MED ORDER — ONDANSETRON HCL 4 MG/2ML IJ SOLN: 4.0000 mg | Freq: Four times a day (QID) | INTRAMUSCULAR | Status: DC | PRN

## 2022-03-04 NOTE — Consult Note (Signed)
Consultation Note Date: 03/04/2022   Patient Name: Darryl Diaz  DOB: 24-Dec-1940  MRN: [id number]  Age / Sex: 81 y.o., male  PCP: Tisovec, [name], MD Referring Physician: Charlynne Cousins, MD  Reason for Consultation:  "End of life. Not improving well enough. Outpatient nephrologist Moshe Cipro- she saw him here last week) and she has concerns about dialyzing him as an outpatient given his outburst deterioration."  HPI/Patient Profile: 81 y.o. male  with past medical history of HTN, HLD, GERD, OA, a.fib, CHF, cirrhosis, ESRD on HD MWF, frequent falls with recent C1 fracture, alcoholic cirrhosis of the liver, cognitive deficits not formally diagnosed but per family description sounds like dementia, cellulitis of foot, admitted on 02/21/2022 with after a fall. Was preparing to be discharged but spiked a fever. Workup revealed sepsis related to osteomyelitis of the spine. He has had difficult time at outpatient dialysis and during dialysis as an inpatient- yelling, pulling lines, taking off his clothes. Palliative medicine consulted for goals of care.    Primary Decision Maker NEXT OF KIN - daughter and son Joelene Millin and Kolton Kienle  Discussion: I have reviewed medical records including [facility], progress notes from this and prior admissions, labs and imaging, discussed with RN.  On evaluation patient is awake and alert, pleasantly confused. Able to tell me his name and that we are in [place]. Unable to elicit his health history for me or participate in higher level conversations.    I introduced Palliative Medicine as specialized medical care for people living with serious illness. It focuses on providing relief from the symptoms and stress of a serious illness. The goal is to improve quality of life for both the patient and the family.  We discussed a brief life review of the patient. He  worked in [place] in [employer]. He was known as a Art gallery manager, enjoyed entertaining people with his jokes and his stories.   As far as functional and nutritional status he has had significant decline in the last year. He was living in assisted living but falling frequently. Incontinent of stool and urine. He has had decreased po intake and weight loss.        We discussed patient's current illness and what it means in the larger context of patient's on-going co-morbidities. Natural disease trajectory and expectations at EOL were discussed. Family expressed that if it were up to patient he would be at home and have a "juicy burger and a glass of wine" and not do dialysis and be comfortable and allowed to pass naturally.   The difference between aggressive medical intervention and comfort care was considered in light of the patient's goals of care.   Discussed transition to comfort measures only which includes stopping dialysis, IV fluids, antibiotics, labs and providing symptom management for SOB, anxiety, nausea, vomiting, and other symptoms of dying.   At close of conversation decision was made to transition patient to full comfort measures only and seek support from hospice.    SUMMARY OF RECOMMENDATIONS -  Transition to full comfort measures '5mg'$  oxycodone concentrate SL q2hr prn SOB, air hunger- avoid morphine Lorazepam '1mg'$  po, SL or IV q4 hr anxiety Haldol .'5mg'$  po, SL or IV q4 hr PRN agitation Glycopyrrolate .'2mg'$  IV q4hr secretions Refer for hospice    Code Status/Advance Care Planning: DNR   Prognosis:   < 2 weeks   Primary Diagnoses: Present on Admission:  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[place])  Acute hyponatremia  Diarrhea  Paroxysmal atrial fibrillation (HCC)  Hypertension  Hyperlipidemia  GERD (gastroesophageal reflux disease)  Debility  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HCC)  CAD (coronary artery disease)  Elevated troponin  Sepsis ([place])   Cellulitis  Prolonged QT interval  CAP (community acquired pneumonia)  MSSA bacteremia   Review of Systems  Unable to perform ROS: Mental status change    Physical Exam Vitals and nursing note reviewed.  Cardiovascular:     Rate and Rhythm: Normal rate.  Pulmonary:     Effort: Pulmonary effort is normal.  Neurological:     Mental Status: He is alert. He is disoriented.  Psychiatric:     Comments: pleasant     Vital Signs: BP (!) 133/52 (BP Location: Right Arm)   Pulse 68   Temp 98.3 F (36.8 C) (Oral)   Resp 17   Ht 6' (1.829 m)   Wt 87.1 kg   SpO2 98%   BMI 26.04 kg/m  Pain Scale: 0-10 POSS *See Group Information*: 1-Acceptable,Awake and alert Pain Score: 0-No pain   SpO2: SpO2: 98 % O2 Device:SpO2: 98 % O2 Flow Rate: .   IO: Intake/output summary:  Intake/Output Summary (Last 24 hours) at 03/04/2022 1501 Last data filed at 03/04/2022 0622 Gross per 24 hour  Intake 125.54 ml  Output 2600 ml  Net -2474.46 ml    LBM: Last BM Date : 03/04/22 Baseline Weight: Weight: 77.6 kg Most recent weight: Weight: 87.1 kg       Thank you for this consult. Palliative medicine will continue to follow  Greater than 50%  of this time was spent counseling and coordinating care related to the above assessment and plan. Total time: 90 minutes Signed by: Mariana Kaufman, AGNP-C Palliative Medicine    Please contact Palliative Medicine Team phone at [phone] for questions and concerns.  For individual provider: See Shea Evans

## 2022-03-04 NOTE — Plan of Care (Signed)

## 2022-03-04 NOTE — Progress Notes (Signed)
[facility] PROGRESS NOTE    Progress Note  Darryl Diaz  CHE:[id number] DOB: 03-03-1941 DOA: 02/21/2022 PCP: [name], MD     Brief Narrative:   Darryl Diaz is an 81 y.o. 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  Assessment/Plan:   Sepsis due to MSSA bacteremia in the setting discitis and osteomyelitis of C6-C7: Blood cultures in 02/20/2022, 02/21/2022, 02/22/2022 showed MSSA bacteremia. Blood cultures repeated on 02/25/2022 are negative till date. MRI of the C-spine showed discitis and osteomyelitis with possible abscess C6-C7 ID was consulted now has been transitioned to IV cefazolin.   TEE cannot be performed due to his C1 articular facet fracture. Physical therapy evaluated the patient, he will need to go to skilled nursing facility. 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  Hypokalemia: Nephrology has been consulted, further management per hemodialysis.  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 Hemoglobin has remained relatively stable at 7.5-7.6.  C1 articular facet fracture: Currently in a c-collar. Cardiology will not proceed with TEE  Elevated troponins: In the setting  of sepsis and end-stage renal disease, likely demand ischemia he denies any chest pain twelve-lead EKG showed no signs of ischemia.  Mild aortic stenosis: Mean gradient of 12 continue to monitor as an outpatient.  Mild hyponatremia: Further management per renal with dialysis. Now resolved.  Diarrhea: Has had no further diarrhea in house.  Paroxysmal atrial fibrillation: On anticoagulation rate control continue amiodarone.  Essential hypertension: Blood pressures well controlled continue current regimen.  Hyperlipidemia: Continue statins.  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  Prolonged QTc: Noted continue to monitor on telemetry.  End-stage renal disease: Further HD per renal. Renal requested that palliative Care be consulted as the patient is not tolerating HD as an outpatient.  Palliative care working with family. Son will meet with palliative care today in person to discuss goals of care. Patient will probably benefit from comfort measures.    DVT prophylaxis: lovenox Family Communication:none Status is: Inpatient Remains inpatient appropriate because: Likely back to SNF after infectious work-up and treatment has been completed.    Code Status:     Code Status Orders  (From admission, onward)           Start     Ordered   02/22/22 0243  Do not attempt resuscitation (DNR)  Continuous       Question Answer Comment  In the event of cardiac or respiratory ARREST Do not call a "code blue"   In the event of cardiac or respiratory ARREST Do not perform Intubation, CPR, defibrillation or ACLS   In the event of cardiac or respiratory ARREST Use medication by any route, position, wound care, and other measures to relive  pain and suffering. May use oxygen, suction and manual treatment of airway obstruction as needed for comfort.      02/22/22 0242           Code Status History     Date  Active Date Inactive Code Status Order ID Comments User Context   01/21/2022 0603 01/27/2022 1811 DNR [id number]  Shela Leff, MD ED   11/24/2021 0007 11/26/2021 2014 DNR [id number]  Rhetta Mura, DO ED   09/27/2021 2202 09/30/2021 2305 DNR [id number]  Rhetta Mura, DO ED   06/07/2021 2050 06/14/2021 2022 DNR [id number]  Rhetta Mura, DO ED   05/02/2021 0353 05/02/2021 2111 DNR [id number]  Elwyn Reach, MD Inpatient   01/17/2021 0623 01/22/2021 2354 DNR [id number]  Reubin Milan, MD ED   01/06/2021 2258 01/09/2021 2208 DNR [id number]  Lenore Cordia, MD ED   12/02/2020 1537 12/04/2020 1641 Full Code [id number]  Karmen Bongo, MD ED   10/13/2020 2222 10/17/2020 2016 Full Code [id number]  Chotiner, Yevonne Aline, MD Inpatient   09/07/2020 2215 09/13/2020 2142 Full Code [id number]  Lenore Cordia, MD ED   03/16/2020 0811 03/20/2020 1908 Full Code [id number]  Karmen Bongo, MD ED   03/06/2020 0100 03/11/2020 1501 Full Code [id number]  Cathlyn Parsons, PA-C Inpatient   03/06/2020 0100 03/06/2020 0100 Full Code [id number]  Cathlyn Parsons, PA-C Inpatient   02/22/2020 1704 03/06/2020 0029 Full Code [id number]  Freada Bergeron, MD ED   02/05/2020 2210 02/08/2020 0218 Full Code [id number]  Barrett, Evelene Croon, PA-C ED   10/15/2019 1204 10/16/2019 1922 Full Code [id number]  Norman Herrlich Inpatient   04/20/2019 1844 04/23/2019 1723 Full Code [id number]  Donita Brooks, NP ED   02/25/2019 2021 03/05/2019 1432 Full Code [id number]  Shela Leff, MD ED   08/11/2018 1707 08/12/2018 1446 Full Code [id number]  Barb Merino, MD Inpatient   04/30/2016 0253 05/04/2016 1828 Full Code [id number]  Wylene Simmer, MD Inpatient   03/30/2016 1347 04/04/2016 1440 Full Code [id number]  Rondel Jumbo PA-C ED   03/30/2016 1347 03/30/2016 1347 Full Code [id number]  Rondel Jumbo, PA-C ED         IV Access:   Peripheral IV   Procedures and diagnostic studies:   No results found.   Medical  Consultants:   None.   Subjective:    Darryl Diaz sleepy today has no new complaints.  Objective:    Vitals:   03/03/22 2013 03/04/22 0453 03/04/22 0642 03/04/22 0837  BP: 128/63 (!) 133/109 (!) 119/51 (!) 133/52  Pulse: 68 72  68  Resp: '18 17  17  '$ Temp: 98.7 F (37.1 C) 98.2 F (36.8 C)  98.3 F (36.8 C)  TempSrc:    Oral  SpO2: 95% 100%  98%  Weight:      Height:       SpO2: 98 %   Intake/Output Summary (Last 24 hours) at 03/04/2022 1022 Last data filed at 03/04/2022 0622 Gross per 24 hour  Intake 125.54 ml  Output 2600 ml  Net -2474.46 ml    Filed Weights   03/01/22 1220 03/03/22 1700 03/03/22 1701  Weight: 85.4 kg 89 kg 87.1 kg    Exam: General exam: In no acute distress. Respiratory system: Good air movement and clear to auscultation. Cardiovascular system: S1 & S2 heard, RRR. No JVD. Gastrointestinal system: Abdomen is nondistended, soft and nontender.  Extremities: No pedal edema. Data Reviewed:    Labs: Basic Metabolic  Panel: Recent Labs  Lab 02/26/22 1011 02/27/22 0400 02/28/22 0425 03/01/22 0514 03/03/22 0438  NA 135 139 134* 134* 133*  K 3.3* 3.8 4.1 4.1 4.4  CL 99 102 98 98 96*  CO2 '28 30 28 25 27  '$ GLUCOSE 121* 104* 100* 99 102*  BUN 38* 19 31* 39* 32*  CREATININE 3.03* 2.08* 2.80* 3.18* 3.02*  CALCIUM 7.6* 7.8* 7.9* 7.7* 7.7*  MG 1.7  --   --   --   --   PHOS 2.6 2.4* 3.9 4.2 4.0    GFR Estimated Creatinine Clearance: 21.4 mL/min (A) (by C-G formula based on SCr of 3.02 mg/dL (H)). Liver Function Tests: Recent Labs  Lab 02/26/22 1011 02/27/22 0400 02/28/22 0425 03/01/22 0514 03/03/22 0438  ALBUMIN 1.9* 1.8* 1.8* 1.8* 1.8*    No results for input(s): "LIPASE", "AMYLASE" in the last 168 hours. No results for input(s): "AMMONIA" in the last 168 hours.  Coagulation profile No results for input(s): "INR", "PROTIME" in the last 168 hours.  COVID-19 Labs  No results for input(s): "DDIMER", "FERRITIN", "LDH",  "CRP" in the last 72 hours.   Lab Results  Component Value Date   SARSCOV2NAA NEGATIVE 02/22/2022   SARSCOV2NAA NEGATIVE 02/21/2022   SARSCOV2NAA NEGATIVE 12/20/2021   SARSCOV2NAA POSITIVE (A) 06/03/2021    CBC: Recent Labs  Lab 03/01/22 0514  WBC 7.9  HGB 7.7*  HCT 22.9*  MCV 102.2*  PLT 171    Cardiac Enzymes: No results for input(s): "CKTOTAL", "CKMB", "CKMBINDEX", "TROPONINI" in the last 168 hours.  BNP (last 3 results) No results for input(s): "PROBNP" in the last 8760 hours. CBG: Recent Labs  Lab 02/25/22 1200 02/27/22 1201 02/27/22 1648  GLUCAP 139* 123* 115*    D-Dimer: No results for input(s): "DDIMER" in the last 72 hours. Hgb A1c: No results for input(s): "HGBA1C" in the last 72 hours. Lipid Profile: No results for input(s): "CHOL", "HDL", "LDLCALC", "TRIG", "CHOLHDL", "LDLDIRECT" in the last 72 hours. Thyroid function studies: No results for input(s): "TSH", "T4TOTAL", "T3FREE", "THYROIDAB" in the last 72 hours.  Invalid input(s): "FREET3"  Anemia work up: No results for input(s): "VITAMINB12", "FOLATE", "FERRITIN", "TIBC", "IRON", "RETICCTPCT" in the last 72 hours.  Sepsis Labs: Recent Labs  Lab 03/01/22 0514  WBC 7.9    Microbiology Recent Results (from the past 240 hour(s))  Culture, blood (Routine X 2) w Reflex to ID Panel     Status: Abnormal   Collection Time: 02/23/22  2:43 AM   Specimen: BLOOD  Result Value Ref Range Status   Specimen Description BLOOD SITE NOT SPECIFIED  Final   Special Requests   Final    BOTTLES DRAWN AEROBIC AND ANAEROBIC Blood Culture results may not be optimal due to an inadequate volume of blood received in culture bottles   Culture  Setup Time   Final    GRAM POSITIVE COCCI IN CLUSTERS AEROBIC BOTTLE ONLY CRITICAL RESULT CALLED TO, READ BACK BY AND VERIFIED WITH: PHARMD MICHAEL [place] ON 02/23/22 @ 2027 BY DRT    Culture (A)  Final    STAPHYLOCOCCUS AUREUS SUSCEPTIBILITIES PERFORMED ON PREVIOUS  CULTURE WITHIN THE LAST 5 DAYS. Performed at [facility], [place] [address], [place], [place] [address]    Report Status 02/24/2022 FINAL  Final  Culture, blood (Routine X 2) w Reflex to ID Panel     Status: None   Collection Time: 02/23/22  2:43 AM   Specimen: BLOOD  Result Value Ref Range Status  Specimen Description BLOOD SITE NOT SPECIFIED  Final   Special Requests   Final    BOTTLES DRAWN AEROBIC AND ANAEROBIC Blood Culture adequate volume   Culture   Final    NO GROWTH 5 DAYS Performed at [facility], [address] [place], [place] [address]    Report Status 02/28/2022 FINAL  Final  Culture, blood (Routine X 2) w Reflex to ID Panel     Status: None   Collection Time: 02/25/22  8:23 AM   Specimen: BLOOD RIGHT ARM  Result Value Ref Range Status   Specimen Description BLOOD RIGHT ARM  Final   Special Requests   Final    BOTTLES DRAWN AEROBIC AND ANAEROBIC Blood Culture adequate volume   Culture   Final    NO GROWTH 5 DAYS Performed at [facility], [place] [address] [place], [place] [address]    Report Status 03/02/2022 FINAL  Final  Culture, blood (Routine X 2) w Reflex to ID Panel     Status: None   Collection Time: 02/25/22  8:30 AM   Specimen: BLOOD  Result Value Ref Range Status   Specimen Description BLOOD RIGHT ANTECUBITAL  Final   Special Requests   Final    BOTTLES DRAWN AEROBIC AND ANAEROBIC Blood Culture adequate volume   Culture   Final    NO GROWTH 5 DAYS Performed at [facility], [place] [address], [place], [place] [address]    Report Status 03/02/2022 FINAL  Final     Medications:    amiodarone  200 mg Oral q morning   carvedilol  3.125 mg Oral BID WC   Chlorhexidine Gluconate Cloth  6 each Topical Q0600   darbepoetin (ARANESP) injection - DIALYSIS  200 mcg Subcutaneous Q Thu-1800   enoxaparin (LOVENOX) injection  30 mg Subcutaneous Daily   escitalopram  10 mg Oral q morning   feeding supplement  237 mL Oral BID BM    lidocaine  3 patch Transdermal Q24H   sodium chloride flush  3 mL Intravenous Q12H   tamsulosin  0.4 mg Oral QHS   Continuous Infusions:  sodium chloride 250 mL (03/03/22 1744)    ceFAZolin (ANCEF) IV 2 g (03/03/22 1746)      LOS: 10 days   Charlynne Cousins  [facility]  03/04/2022, 10:22 AM

## 2022-03-04 NOTE — Progress Notes (Addendum)
[facility] Progress Note   Subjective:    On Tuesday near the end of treatment the patient was yelling, took off his gown and took off his C-collar.  Treatment was terminated early.  He required close supervision during the last treatment on 10/26 - close attention of RN and tech throughout treatment to redirect.  (2kg UF).  We are not able to safely dialyze him as an outpatient given his continued behavioral outbursts.  Palliative care has been consulted   He is mumbling on arrival and states that "I just want certain things in the order that I want them in".  He has his C-collar off.  Spoke with RN   Review of systems:    Limited somewhat by dementia however:  Denies shortness of breath or chest pain Denies n/v/d  Objective Vitals:   03/03/22 1701 03/03/22 2013 03/04/22 0453 03/04/22 0642  BP:  128/63 (!) 133/109 (!) 119/51  Pulse:  68 72   Resp:  18 17   Temp:  98.7 F (37.1 C) 98.2 F (36.8 C)   TempSrc:      SpO2:  95% 100%   Weight: 87.1 kg     Height:       Physical Exam        General elderly male in bed in no acute distress HEENT normocephalic atraumatic extraocular movements intact sclera anicteric Neck supple trachea midline Lungs clear to auscultation bilaterally normal work of breathing at rest  Heart S1S2 no rub Abdomen soft nontender nondistended Extremities no edema  Psych anxious and mumbling on arrival  Neuro - awake on arrival and oriented to person and location. Thinks about year (2023) Access LUE AVF bruit and thrill    Filed Weights   03/01/22 1220 03/03/22 1700 03/03/22 1701  Weight: 85.4 kg 89 kg 87.1 kg    Intake/Output Summary (Last 24 hours) at 03/04/2022 0800 Last data filed at 03/04/2022 4132 Gross per 24 hour  Intake 245.54 ml  Output 2600 ml  Net -2354.46 ml     Additional Objective Labs: Basic Metabolic Panel: Recent Labs  Lab 02/28/22 0425 03/01/22 0514 03/03/22 0438  NA 134* 134* 133*  K 4.1 4.1 4.4   CL 98 98 96*  CO2 '28 25 27  '$ GLUCOSE 100* 99 102*  BUN 31* 39* 32*  CREATININE 2.80* 3.18* 3.02*  CALCIUM 7.9* 7.7* 7.7*  PHOS 3.9 4.2 4.0   Liver Function Tests: Recent Labs  Lab 02/28/22 0425 03/01/22 0514 03/03/22 0438  ALBUMIN 1.8* 1.8* 1.8*   CBC: Recent Labs  Lab 03/01/22 0514  WBC 7.9  HGB 7.7*  HCT 22.9*  MCV 102.2*  PLT 171   Blood Culture    Component Value Date/Time   SDES BLOOD RIGHT ANTECUBITAL 02/25/2022 0830   SPECREQUEST  02/25/2022 0830    BOTTLES DRAWN AEROBIC AND ANAEROBIC Blood Culture adequate volume   CULT  02/25/2022 0830    NO GROWTH 5 DAYS Performed at [facility], [place] [address], [place], [place] [address]    REPTSTATUS 03/02/2022 FINAL 02/25/2022 0830   Cardiac Enzymes: No results for input(s): "CKTOTAL", "CKMB", "CKMBINDEX", "TROPONINI" in the last 168 hours.  Iron Studies:  No results for input(s): "IRON", "TIBC", "TRANSFERRIN", "FERRITIN" in the last 72 hours.  Lab Results  Component Value Date   INR 1.4 (H) 02/22/2022   INR 1.4 (H) 02/21/2022   INR 1.1 11/23/2021   Studies/Results: No results found.  Medications:  sodium chloride 250 mL (03/03/22  1744)    ceFAZolin (ANCEF) IV 2 g (03/03/22 1746)    amiodarone  200 mg Oral q morning   carvedilol  3.125 mg Oral BID WC   Chlorhexidine Gluconate Cloth  6 each Topical Q0600   darbepoetin (ARANESP) injection - DIALYSIS  200 mcg Subcutaneous Q Thu-1800   enoxaparin (LOVENOX) injection  30 mg Subcutaneous Daily   escitalopram  10 mg Oral q morning   feeding supplement  237 mL Oral BID BM   lidocaine  3 patch Transdermal Q24H   sodium chloride flush  3 mL Intravenous Q12H   tamsulosin  0.4 mg Oral QHS    Dialysis Orders: TTS - NW 4hrs, BFR 400/AF 1.5,  EDW 79.8 kg, 3.0 K/ 2.5 Ca   Access: LU AVF  No Heparin  Mircera 150 mcg q2wks - last 9/27  Assessment/Plan: Sepsis due to gram-positive bacteremia in setting of left foot cellulitis and left foot  osteomyelitis -CT with likely PNA.  Started on broad spectrum ABX. ID consulted and ABX narrowed to IV cefazolin.  Blood cultures 02/20/2021 with gram-positive cocci in clusters.  Surveillance blood cultures 02/22/2022 negative.  ID recommended MRI thoracic/lumbar spine and TEE.  MRI thoracic showed signal intensity at C6-C7 interspace which could be degenerative vs osteomyelitis.  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  MRI cervical spine with findings suspicious for discitis and osteomyelitis. No epidural abscess.  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 Sharol Given with orthopedics. No surgical intervention indicated at this time.  Does not think foot is cause of sepsis  Elevated troponin   - Per primary team.  Was felt 2/2 setting of sepsis  4. Hypokalemia - improved; follow K with HD   7. ESRD  - Currently on HD TTS schedule here due to staffing (and to allow him to be dialyzed in the am more consistently here given dementia).   - Nephrology has concerns about him being able to be dialyzed in the outpatient setting due to his outbursts related to his dementia.  We have requested a palliative care consult.  I see there is a planned meeting today with the family.  Appreciate assistance off all teams and staff - Note final ID recs as above: Cefazolin 2g three times a week with HD for 6 weeks with an end date of 04/08/22              8. Hypertension/volume   - acceptable    9. 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     159 Dispo-  we have requested a palliative consult as nephrology is concerned that we will not be able to safely dialyze him in the outpatient setting given his continued outbursts with HD and the level of monitoring required with HD secondary to same    [place]10/27/2023, 8:22 AM    Spoke with his son this morning - he is sensitive to his father's decline  in his health and is meeting with palliative.  He wants to do what he can for his father.  All questions answered.  He appreciated the call   Claudia Desanctis, MD 8:38 AM 03/04/2022

## 2022-03-04 NOTE — Progress Notes (Signed)
Physical Therapy Treatment Patient Details Name: Darryl Diaz MRN: [id number] DOB: April 02, 1941 Today's Date: 03/04/2022   History of Present Illness Pt is an 81 y/o M presented from SNF for decreased PO Intake, swelling and redness of the left foot.  Pt now with significant low back pain starting on 10/23.Recent admit 9/15 after fall at home with C72f, continues to wear C-collar. PMH includes alcohol abuse, ESRD on HD MWF, CHF, HTN, and HLD.    PT Comments    Pt admitted with above diagnosis. Pt was unable to come to eOB with +2 max to total assist. Pt resisting and laid back down and refused to sit up.  Noted BM therefore cleaned pt of BM.  Pt self limiting today. Will follow acutely.  Pt currently with functional limitations due to balance and endurance deficits. Pt will benefit from skilled PT to increase their independence and safety with mobility to allow discharge to the venue listed below.      Recommendations for follow up therapy are one component of a multi-disciplinary discharge planning process, led by the attending physician.  Recommendations may be updated based on patient status, additional functional criteria and insurance authorization.  Follow Up Recommendations  Skilled nursing-short term rehab (<3 hours/day) Can patient physically be transported by private vehicle: No   Assistance Recommended at Discharge Frequent or constant Supervision/Assistance  Patient can return home with the following Two people to help with walking and/or transfers;A lot of help with bathing/dressing/bathroom;Assistance with cooking/housework;Direct supervision/assist for medications management;Direct supervision/assist for financial management;Assist for transportation;Help with stairs or ramp for entrance   Equipment Recommendations  None recommended by PT    Recommendations for Other Services       Precautions / Restrictions Precautions Precautions: Cervical;Fall Precaution Booklet  Issued: No Required Braces or Orthoses: Cervical Brace Cervical Brace: Hard collar;At all times Restrictions Weight Bearing Restrictions: No     Mobility  Bed Mobility Overal bed mobility: Needs Assistance Bed Mobility: Sit to Supine, Supine to Sit Rolling: Min assist   Supine to sit: Max assist, HOB elevated, +2 for physical assistance Sit to supine: Max assist, +2 for physical assistance, HOB elevated   General bed mobility comments: assist for completing LE translation to EOB, trunk elevation and pt resists so much had to lie pt back down. noted Bm therefore cleaned pt up which took incr time. Pt rolled multiple x with min assist by staff.    Transfers                   General transfer comment: NT    Ambulation/Gait               General Gait Details: NT   Stairs             Wheelchair Mobility    Modified Rankin (Stroke Patients Only)       Balance Overall balance assessment: Needs assistance Sitting-balance support: Feet supported, Bilateral upper extremity supported Sitting balance-Leahy Scale: Zero   Postural control: Posterior lean                                  Cognition Arousal/Alertness: Awake/alert Behavior During Therapy: WFL for tasks assessed/performed Overall Cognitive Status: No family/caregiver present to determine baseline cognitive functioning  General Comments: Pt refused to get OOB once started to assist him to EOB.  Pt only oriented to self and thinks he is at home.  Follows one-step commands with tactile cuing well        Exercises General Exercises - Lower Extremity Ankle Circles/Pumps: AROM, Both, 10 reps, Supine Quad Sets: AROM, Both, 10 reps, Supine Heel Slides: AROM, AAROM, Both, 10 reps, Supine Hip ABduction/ADduction: AAROM, Both, 10 reps, Supine    General Comments        Pertinent Vitals/Pain Pain Assessment Pain Assessment:  Faces Faces Pain Scale: Hurts whole lot Breathing: normal Negative Vocalization: occasional moan/groan, low speech, negative/disapproving quality Facial Expression: sad, frightened, frown Body Language: tense, distressed pacing, fidgeting Consolability: distracted or reassured by voice/touch PAINAD Score: 4 Pain Location: generalized, back Pain Descriptors / Indicators: Grimacing, Aching Pain Intervention(s): Limited activity within patient's tolerance, Monitored during session, Repositioned    Home Living                          Prior Function            PT Goals (current goals can now be found in the care plan section) Progress towards PT goals: Not progressing toward goals - comment (self limiting today and wtih BM)    Frequency    Min 3X/week      PT Plan Current plan remains appropriate    Co-evaluation              AM-PAC PT "6 Clicks" Mobility   Outcome Measure  Help needed turning from your back to your side while in a flat bed without using bedrails?: A Lot Help needed moving from lying on your back to sitting on the side of a flat bed without using bedrails?: A Lot Help needed moving to and from a bed to a chair (including a wheelchair)?: Total Help needed standing up from a chair using your arms (e.g., wheelchair or bedside chair)?: A Lot Help needed to walk in hospital room?: Total Help needed climbing 3-5 steps with a railing? : Total 6 Click Score: 9    End of Session Equipment Utilized During Treatment: Gait belt Activity Tolerance: Patient limited by fatigue;Patient limited by pain Patient left: in bed;with bed alarm set;with call bell/phone within reach Nurse Communication: Mobility status PT Visit Diagnosis: Muscle weakness (generalized) (M62.81);History of falling (Z91.81);Difficulty in walking, not elsewhere classified (R26.2);Pain Pain - Right/Left:  (center) Pain - part of body:  (back)     Time: 2297-9892 PT Time  Calculation (min) (ACUTE ONLY): 17 min  Charges:  $Therapeutic Activity: 8-22 mins                     [facility] M,PT Acute Rehab Services [phone]    Alvira Philips 03/04/2022, 3:23 PM

## 2022-03-04 NOTE — Progress Notes (Signed)
Nutrition Follow-up  DOCUMENTATION CODES:  Not applicable  INTERVENTION:  Continue regular diet, encourage PO intake Ensure Enlive po TID, each supplement provides 350 kcal and 20 grams of protein. Renavite daily Monitor PMT discussions with family  NUTRITION DIAGNOSIS:  Increased nutrient needs related to catabolic illness (ESRD on HD) as evidenced by estimated needs. - remains applicable  GOAL:  Patient will meet greater than or equal to 90% of their needs -progressing, diet and supplements in place  MONITOR:  PO intake, Supplement acceptance  REASON FOR ASSESSMENT:  Consult Assessment of nutrition requirement/status  ASSESSMENT:  Pt with hx of HTN, HLD, GERD, atrial fibrillation, CHF, cirrhosis, hx EtOH abuse, ESRD on HD, and CAD presented to ED from his facility with worsening weakness. Noted pt had a recent ED visit for a fall with a C1 fracture.  Pt resting in bed at the time of assessment. Pleasantly confused, interactive but mostly rambled during conversation. 100% consumed ensure present at bedside. Pt reports that he has been eating well, but noted poor intake is recorded my nursing staff. Pt with significant muscle and fat depletions throughout body.   Family is meeting with palliative care this afternoon to discuss care moving forward.  Average Meal Intake: 10/19-10/26: 36% intake x 11 recorded meals  Nutritionally Relevant Medications: Scheduled Meds:  Ensure Enlive  237 mL Oral BID BM   Continuous Infusions:  sodium chloride 250 mL (03/03/22 1744)    ceFAZolin (ANCEF) IV 2 g (03/03/22 1746)   Labs Reviewed: Na 133, chloride 96 BUN 32, creatinine 3.02  Outpatient HD: Access: LU AVF, TTS schedule EDW: 79.8 Last HD 10/26 2L UF  NUTRITION - FOCUSED PHYSICAL EXAM: Flowsheet Row Most Recent Value  Orbital Region Moderate depletion  Upper Arm Region Severe depletion  Thoracic and Lumbar Region Severe depletion  Buccal Region Moderate depletion   Temple Region Mild depletion  Clavicle Bone Region Severe depletion  Clavicle and Acromion Bone Region Severe depletion  Scapular Bone Region Moderate depletion  Dorsal Hand Severe depletion  Patellar Region Severe depletion  Anterior Thigh Region Severe depletion  Posterior Calf Region Severe depletion  Edema (RD Assessment) None  Hair Reviewed  Eyes Reviewed  Mouth Reviewed  Skin Reviewed  Nails Reviewed   Diet Order:   Diet Order             Diet regular Room service appropriate? Yes with Assist; Fluid consistency: Thin  Diet effective now                   EDUCATION NEEDS:  Not appropriate for education at this time  Skin:  Skin Assessment: Reviewed RN Assessment  Last BM:  10/26 - type 6  Height:  Ht Readings from Last 1 Encounters:  02/21/22 6' (1.829 m)    Weight:  Wt Readings from Last 1 Encounters:  03/03/22 87.1 kg    Ideal Body Weight:  80.9 kg  BMI:  Body mass index is 26.04 kg/m.  Estimated Nutritional Needs:  Kcal:  3536-1443 kcals Protein:  95-115 gm Fluid:  1540-0867 mL    Ranell Patrick, RD, LDN Clinical Dietitian RD pager # available in [facility]  After hours/weekend pager # available in [facility]

## 2022-03-04 NOTE — TOC Progression Note (Addendum)
Transition of Care [facility]) - Progression Note    Patient Details  Name: Darryl Diaz MRN: [id number] Date of Birth: 01/02/1941  Transition of Care [facility]) CM/SW [place], RN Phone Number: 03/04/2022, 3:08 PM  Clinical Narrative:    CM was present during a family care meeting with the family.  The patient's family was given Medicare Choice regarding Hospice companies and they chose [facility].  I called and placed the referral with Margret Chance, RNCM.  The patient's family preferred having the patient return home to [place] with hospice services but may possibly be open to inpatient hospice facility at [facility].  I called and left a message with DON at [facility] [phone] and I'm waiting to hear back about availability of patient returning to the ALF with care at the facility.  03/04/2022 [facility] accepted the referral for Hospice care.  I called and spoke with Mickel Baas, [place] at [facility] [phone], fax [phone]- and she will follow up with [facility] to determine if patient will return to [facility] or be admitted to [facility] in [place].  Notes are not available from Palliative Medicine at this time but Webb Silversmith will follow up with the family and [place] in regards for disposition plans for the patient.   Expected Discharge Plan: [place] Barriers to Discharge: Continued Medical Work up  Expected Discharge Plan and Services Expected Discharge Plan: [place]   Discharge Planning Services: CM Consult Post Acute Care Choice: [place] Living arrangements for the past 2 months: [place] ([place])                                       Social Determinants of Health (SDOH) Interventions    Readmission Risk Interventions    02/24/2022    2:15 PM 09/29/2021    2:21 PM 01/18/2021     9:28 AM  Readmission Risk Prevention Plan  Transportation Screening Complete Complete Complete  Medication Review Press photographer) Complete Complete Complete  PCP or Specialist appointment within 3-5 days of discharge Complete Complete Complete  HRI or Home Care Consult Complete Complete Complete  SW Recovery Care/Counseling Consult Complete Complete Complete  Palliative Care Screening Not Applicable Not Applicable Not Applicable  Skilled Nursing Facility Complete Complete Complete

## 2022-03-05 DIAGNOSIS — A419 Sepsis, unspecified organism: Secondary | ICD-10-CM | POA: Diagnosis not present

## 2022-03-05 DIAGNOSIS — R6520 Severe sepsis without septic shock: Secondary | ICD-10-CM | POA: Diagnosis not present

## 2022-03-05 NOTE — Progress Notes (Signed)
Call received from [place] with [facility] who reports she spoke to pt's family and they are requesting hospice home placement at [facility] [place]. Per Benjamine Mola, pt has been approved for hospice home but no beds available at [facility] today and family refused [facility]. MD updated. SW will follow and assist as indicated.   Wandra Feinstein, MSW, LCSW [phone] (coverage)

## 2022-03-05 NOTE — Plan of Care (Signed)
Note transition to full comfort measures.  Palliative care is consulting and hospice is being arranged.  Appreciate all teams and staff support of the patient and is family at this difficult time   Nephrology will sign off.  Please do not hesitate to contact me if I can be of assistance with his care  Claudia Desanctis, MD 7:46 AM 03/05/2022

## 2022-03-05 NOTE — Progress Notes (Signed)
Patient has reddened areas on bilateral buttocks and reddened area on Left lower extremity near ankle appears to be stage 1 pressure ulcer. C/O of pain when asked to turn from side to side or any type of movement. Applied barrier ointment to bottom and ankle.

## 2022-03-05 NOTE — Progress Notes (Signed)
[facility] PROGRESS NOTE    Progress Note  TIGER SPIEKER  XIP:[id number] DOB: [birth year] DOA: 02/21/2022 PCP: [name], MD     Brief Narrative:   Darryl Diaz is an 81 y.o. 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  Patient was unable to tolerate dialysis so palliative care was consulted.  After having palliative care meetings with family and patient, they decided to proceed with comfort care only and stop dialysis.  Hospice care was consulted as patient and family would like to go to hospice facility at [facility].  No bed available today.  TOC is aware.  Assessment/Plan:   Sepsis due to MSSA bacteremia in the setting discitis and osteomyelitis of C6-C7: Blood cultures in 02/20/2022, 02/21/2022, 02/22/2022 showed MSSA bacteremia. Blood cultures repeated on 02/25/2022 are negative till date. MRI of the C-spine showed discitis and osteomyelitis with possible abscess C6-C7 ID was consulted now has been transitioned to IV cefazolin.   TEE cannot be performed due to his C1 articular facet fracture. Physical therapy evaluated the patient, he will need to go to skilled nursing facility. Will need 6 weeks of antibiotics with first day being 02/25/2022 -Patient now comfort care so antibiotics were discontinued.  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 Hemoglobin has remained relatively stable at 7.5-7.6.  C1 articular facet fracture: Currently in a c-collar. Cardiology will not proceed with TEE  Elevated troponins: In the setting of sepsis and end-stage renal disease, likely demand ischemia he denies any chest pain twelve-lead EKG showed no signs of ischemia.  Mild aortic stenosis: Mean gradient of 12 continue to monitor as an outpatient.  Mild hyponatremia: Further management per renal with dialysis. Now resolved.  Diarrhea: Has had no further diarrhea in house.  Paroxysmal atrial fibrillation: On anticoagulation rate control continue amiodarone.  Essential hypertension: Blood pressures well controlled continue current regimen.  Hyperlipidemia: Continue statins.  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  Prolonged QTc: Noted continue to monitor on telemetry.  End-stage renal disease: Further HD per renal. Renal requested that palliative Care be consulted as the patient is not tolerating HD as an outpatient.  Palliative care working with family. Son will meet with palliative care today in person to discuss goals of care. Patient is being transitioned to comfort care only.   DVT prophylaxis: Patient is now comfort care Family Communication: Discussed with son and sister at bedside Status is: Inpatient Remains inpatient appropriate because: Hospice facility when bed is available   Code Status:     Code Status Orders  (From admission, onward)           Start     Ordered   02/22/22 0243  Do not attempt resuscitation (DNR)  Continuous       Question Answer Comment  In the event of cardiac or respiratory ARREST Do not call a "code blue"   In the event of  cardiac or respiratory ARREST Do not perform Intubation, CPR, defibrillation or ACLS   In the event of cardiac or respiratory ARREST Use medication by any route, position, wound care, and other measures to relive pain and suffering. May use oxygen, suction and manual treatment of airway obstruction as needed for comfort.      02/22/22 0242           Code Status History     Date Active Date Inactive Code Status Order ID Comments User Context   01/21/2022 0603 01/27/2022 1811 DNR [id number]  Shela Leff, MD ED   11/24/2021 0007 11/26/2021 2014 DNR [id number]  Rhetta Mura, DO ED   09/27/2021 2202 09/30/2021 2305 DNR [id number]  Rhetta Mura, DO ED   06/07/2021 2050 06/14/2021 2022 DNR [id number]  Rhetta Mura, DO ED   05/02/2021 0353 05/02/2021 2111 DNR [id number]  Elwyn Reach, MD Inpatient   01/17/2021 0623 01/22/2021 2354 DNR [id number]  Reubin Milan, MD ED   01/06/2021 2258 01/09/2021 2208 DNR [id number]  Lenore Cordia, MD ED   12/02/2020 1537 12/04/2020 1641 Full Code [id number]  Karmen Bongo, MD ED   10/13/2020 2222 10/17/2020 2016 Full Code [id number]  Chotiner, Yevonne Aline, MD Inpatient   09/07/2020 2215 09/13/2020 2142 Full Code [id number]  Lenore Cordia, MD ED   03/16/2020 0811 03/20/2020 1908 Full Code [id number]  Karmen Bongo, MD ED   03/06/2020 0100 03/11/2020 1501 Full Code [id number]  Cathlyn Parsons, PA-C Inpatient   03/06/2020 0100 03/06/2020 0100 Full Code [id number]  Cathlyn Parsons, PA-C Inpatient   02/22/2020 1704 03/06/2020 0029 Full Code [id number]  Freada Bergeron, MD ED   02/05/2020 2210 02/08/2020 0218 Full Code [id number]  Barrett, Evelene Croon, PA-C ED   10/15/2019 1204 10/16/2019 1922 Full Code [id number]  Norman Herrlich Inpatient   04/20/2019 1844 04/23/2019 1723 Full Code [id number]  Donita Brooks, NP ED   02/25/2019 2021 03/05/2019 1432 Full Code [id number]  Shela Leff, MD ED   08/11/2018 1707 08/12/2018 1446 Full Code [id number]  Barb Merino, MD Inpatient   04/30/2016 0253 05/04/2016 1828 Full Code [id number]  Wylene Simmer, MD Inpatient   03/30/2016 1347 04/04/2016 1440 Full Code [id number]  Rondel Jumbo PA-C ED   03/30/2016 1347 03/30/2016 1347 Full Code [id number]  Rondel Jumbo, PA-C ED         IV Access:   Peripheral IV   Procedures and diagnostic studies:   No results found.   Medical Consultants:   None.   Subjective:   Patient was seen and examined today.  Denies any pain.  He was feeling little somnolent but able to communicate.  Son and sister at bedside  Objective:    Vitals:   03/04/22 2009 03/04/22 2100 03/05/22 0528 03/05/22 0700  BP: (!) 157/133 (!) 115/49 (!) 171/123 (!) 124/53  Pulse: 71 70 98 68  Resp: '17  18 17  '$ Temp: 98.4 F (36.9 C)  98.2 F (36.8 C) 98.4 F (36.9 C)  TempSrc:    Oral  SpO2: 99%  92% 95%  Weight:      Height:       SpO2: 95 %   Intake/Output Summary (Last 24 hours) at 03/05/2022 1536 Last data filed at 03/04/2022 1850 Gross per 24  hour  Intake 237 ml  Output 250 ml  Net -13 ml    Filed Weights   03/01/22 1220 03/03/22 1700 03/03/22 1701  Weight: 85.4 kg 89 kg 87.1 kg    Exam: General.  Pale elderly man, in no acute distress. Pulmonary.  Lungs clear bilaterally, normal respiratory effort. CV.  Regular rate and rhythm, no JVD, rub or murmur. Abdomen.  Soft, nontender, nondistended, BS positive. CNS.  Alert and oriented .  No focal neurologic deficit. Extremities.  No edema, no cyanosis, pulses intact and symmetrical. Psychiatry.  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 Liver Function Tests: Recent Labs  Lab 02/27/22 0400 02/28/22 0425 03/01/22 0514 03/03/22 0438  ALBUMIN 1.8* 1.8* 1.8* 1.8*    No results for input(s): "LIPASE", "AMYLASE" in the last 168 hours. No results for input(s): "AMMONIA" in the last 168 hours.  Coagulation profile No results for input(s): "INR", "PROTIME" in the last 168 hours.  COVID-19 Labs  No results for input(s): "DDIMER", "FERRITIN", "LDH", "CRP" in the last 72 hours.   Lab Results  Component Value Date   SARSCOV2NAA NEGATIVE 02/22/2022   SARSCOV2NAA NEGATIVE 02/21/2022   SARSCOV2NAA NEGATIVE 12/20/2021   SARSCOV2NAA POSITIVE (A) 06/03/2021    CBC: Recent Labs  Lab 03/01/22 0514  WBC 7.9  HGB 7.7*  HCT 22.9*  MCV 102.2*  PLT 171    Cardiac Enzymes: No results for input(s): "CKTOTAL", "CKMB", "CKMBINDEX", "TROPONINI" in the last 168 hours.  BNP (last 3 results) No results for input(s): "PROBNP" in the last 8760 hours. CBG: Recent Labs  Lab 02/27/22 1201 02/27/22 1648  GLUCAP 123* 115*    D-Dimer: No results for input(s): "DDIMER" in the last 72 hours. Hgb A1c: No results for input(s): "HGBA1C" in the last 72 hours. Lipid Profile: No results for input(s): "CHOL", "HDL", "LDLCALC", "TRIG", "CHOLHDL", "LDLDIRECT" in the last 72 hours. Thyroid function studies: No results for input(s): "TSH", "T4TOTAL", "T3FREE", "THYROIDAB" in the last 72 hours.  Invalid input(s): "FREET3"  Anemia work up: No results for input(s): "VITAMINB12", "FOLATE", "FERRITIN", "TIBC", "IRON", "RETICCTPCT" in the last 72 hours.  Sepsis Labs: Recent Labs  Lab 03/01/22 0514  WBC 7.9    Microbiology Recent Results (from the past 240 hour(s))  Culture, blood (Routine X 2) w Reflex to ID Panel     Status: None   Collection Time: 02/25/22  8:23 AM   Specimen: BLOOD RIGHT ARM  Result Value Ref Range Status   Specimen  Description BLOOD RIGHT ARM  Final   Special Requests   Final    BOTTLES DRAWN AEROBIC AND ANAEROBIC Blood Culture adequate volume   Culture   Final    NO GROWTH 5 DAYS Performed at [facility], [address] [place], [place] [address]    Report Status 03/02/2022 FINAL  Final  Culture, blood (Routine X 2) w Reflex to ID Panel     Status: None  Collection Time: 02/25/22  8:30 AM   Specimen: BLOOD  Result Value Ref Range Status   Specimen Description BLOOD RIGHT ANTECUBITAL  Final   Special Requests   Final    BOTTLES DRAWN AEROBIC AND ANAEROBIC Blood Culture adequate volume   Culture   Final    NO GROWTH 5 DAYS Performed at [facility], [address], [place], [place] [address]    Report Status 03/02/2022 FINAL  Final     Medications:    amiodarone  200 mg Oral q morning   carvedilol  3.125 mg Oral BID WC   Chlorhexidine Gluconate Cloth  6 each Topical Q0600   escitalopram  10 mg Oral q morning   feeding supplement  237 mL Oral BID BM   lidocaine  3 patch Transdermal Q24H   sodium chloride flush  3 mL Intravenous Q12H   tamsulosin  0.4 mg Oral QHS   Continuous Infusions:  sodium chloride 250 mL (03/03/22 1744)      LOS: 11 days   [facility]  Time spent.  40-minute  This record has been created using Systems analyst. Errors have been sought and corrected,but may not always be located. Such creation errors do not reflect on the standard of care.   03/05/2022, 3:36 PM

## 2022-03-06 DIAGNOSIS — R6520 Severe sepsis without septic shock: Secondary | ICD-10-CM | POA: Diagnosis not present

## 2022-03-06 DIAGNOSIS — A419 Sepsis, unspecified organism: Secondary | ICD-10-CM | POA: Diagnosis not present

## 2022-03-06 MED ORDER — ENSURE ENLIVE PO LIQD: 237.0000 mL | Freq: Two times a day (BID) | ORAL | 12 refills | Status: AC

## 2022-03-06 MED ORDER — CARVEDILOL 3.125 MG PO TABS: 3.1250 mg | ORAL_TABLET | Freq: Two times a day (BID) | ORAL | Status: AC

## 2022-03-06 MED ORDER — LIDOCAINE 5 % EX PTCH: 3.0000 | MEDICATED_PATCH | CUTANEOUS | 0 refills | Status: AC

## 2022-03-06 NOTE — TOC Transition Note (Signed)
Transition of Care [facility]) - CM/SW Discharge Note   Patient Details  Name: Darryl Darryl MRN: [id number] Date of Birth: 1940-06-16  Transition of Care [facility]) CM/SW Contact:  Amador Cunas, [place] Phone Number: 03/06/2022, 10:16 AM   Clinical Narrative: Pt's son and dtr have accepted bed at [facility]. Spoke to [place] with Hospice who confirmed they are prepared to admit pt today. RN provided with number for report and PTAR arranged for transport. SW signing off at dc.   Wandra Feinstein, MSW, LCSW [phone] (coverage)        Final next level of care: [place] Barriers to Discharge: No Barriers Identified   Patient Goals and CMS Choice Patient states their goals for this hospitalization and ongoing recovery are:: Patient unable to state goals at this time CMS Medicare.gov Compare Post Acute Care list provided to:: Patient Represenative (must comment) (Patient's son, Deaaron Fulghum) Choice offered to / list presented to : [place] / [place], Adult Children  Discharge Placement              Patient chooses bed at: Other - please specify in the comment section below: ([facility]) Patient to be transferred to facility by: [place] Name of family member notified: Kim/dtr Patient and family notified of of transfer: 03/06/22  Discharge Plan and Services   Discharge Planning Services: CM Consult Post Acute Care Choice: [place]                               Social Determinants of Health (SDOH) Interventions     Readmission Risk Interventions    02/24/2022    2:15 PM 09/29/2021    2:21 PM 01/18/2021    9:28 AM  Readmission Risk Prevention Plan  Transportation Screening Complete Complete Complete  Medication Review Press photographer) Complete Complete Complete  PCP or Specialist appointment within 3-5 days of discharge Complete Complete Complete  HRI or Home Care Consult Complete Complete Complete  SW  Recovery Care/Counseling Consult Complete Complete Complete  Palliative Care Screening Not Applicable Not Applicable Not Applicable  Skilled Nursing Facility Complete Complete Complete

## 2022-03-06 NOTE — Discharge Summary (Signed)
Physician Discharge Summary   Patient: Darryl Diaz MRN: [id number] DOB: Aug 19, 1940  Admit date:     02/21/2022  Discharge date: 03/06/22  Discharge Physician: Darryl Diaz   PCP: Darryl Pao, MD   Recommendations at discharge:  Patient is being discharged to hospice facility  Discharge Diagnoses: Principal Problem:   Sepsis Darryl Diaz) Active Problems:   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[place])   Acute hyponatremia   Diarrhea   Paroxysmal atrial fibrillation ([place])   Hypertension   Hyperlipidemia   GERD (gastroesophageal reflux disease)   Debility   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[place])   CAD (coronary artery disease)   Elevated troponin   Cellulitis   Prolonged QT interval   CAP (community acquired pneumonia)   Staphylococcus aureus bacteremia   MSSA bacteremia   Vertebral osteomyelitis [facility])   Hospital Course: Darryl Diaz is an 81 y.o. 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  Patient was also found to have left foot osteomyelitis of the second toe, orthopedic does not think that it is the cause of sepsis.  C-spine discitis and osteomyelitis with possible abscess at C6-C7.  Unable to perform TEE due to C1 articular facet fracture.   Patient was unable to tolerate dialysis so palliative care was consulted.  After having palliative care  meetings with family and patient, they decided to proceed with comfort care only and stop dialysis.  Patient and family decided to proceed with hospice facility.  Patient remains alert and oriented and denies any pain.  Patient is waiting for prognosis for being ESRD patient and unable to do dialysis, along with advanced age and other comorbidities.  Patient is being discharged to hospice facility for end-of-life care.     Consultants: Infectious disease, orthopedic surgery, nephrology Procedures performed:   Disposition: Hospice care Diet recommendation:  Discharge Diet Orders (From admission, onward)     Start     Ordered   03/06/22 0000  Diet - low sodium heart healthy        03/06/22 0949           Renal diet DISCHARGE MEDICATION: Allergies as of 03/06/2022       Reactions   Penicillin G Sodium Rash, Other (See Comments)    (tolerates Keflex) NOT DOCUMENTED ON THE MAR   Penicillins Rash   NOT DOCUMENTED ON THE MAR        Medication List     STOP taking these medications    atorvastatin 20 MG tablet Commonly known as: LIPITOR   doxycycline 100 MG capsule Commonly known as: VIBRAMYCIN   ferrous sulfate 379 MG Tbec   FOLIC ACID PO   MULTIVITAMIN PO   thiamine 100 MG tablet Commonly known as: Vitamin B-1   torsemide 20 MG tablet Commonly known as: DEMADEX       TAKE these medications    amiodarone 200 MG tablet Commonly known as: PACERONE TAKE 1 TABLET BY MOUTH ONCE  DAILY What changed: when to take this   carvedilol 3.125 MG tablet Commonly known as: COREG Take 1 tablet (3.125 mg total) by mouth 2 (two) times daily with a meal. What changed:  medication strength how much to take when to take this   cetirizine 10 MG tablet Commonly known as: ZYRTEC Take 10 mg by mouth daily.   escitalopram 10 MG tablet Commonly known as: LEXAPRO Take 1 tablet (10 mg total) by mouth daily. What changed: when to take this   feeding supplement  Liqd Take 237 mLs by mouth 2 (two) times daily between meals.   lidocaine 5 % Commonly known as: LIDODERM Place 3 patches onto the skin daily. Remove & Discard patch within 12 hours or as directed by MD   nitroGLYCERIN 0.4 MG SL tablet Commonly known as: NITROSTAT Place 1 tablet (0.4 mg total) under the tongue every 5 (five) minutes x 3 doses as needed for chest pain.   pantoprazole 40 MG tablet Commonly known as: PROTONIX Take 1 tablet (40 mg total) by mouth daily. What changed: when to take this   saccharomyces boulardii 250 MG capsule Commonly known as: FLORASTOR Take 1 capsule (250 mg total) by mouth 2 (two) times daily.   tamsulosin 0.4 MG Caps capsule Commonly known as: FLOMAX Take 0.4 mg by mouth at bedtime.   traZODone 50 MG tablet Commonly known as: DESYREL Take 1 tablet (50 mg total) by mouth at bedtime.               Home Infusion Instuctions  (From admission, onward)           Start     Ordered   02/28/22 0000  Home infusion instructions       Question:  Instructions  Answer:  Flushing of vascular access device: 0.9% NaCl pre/post medication administration and prn patency; Heparin 100 u/ml, 51m for implanted ports and Heparin 10u/ml, 582mfor all other central venous catheters.   02/28/22 1259            Follow-up Information     Darryl Diaz Follow up.   Specialty: Infectious Diseases Why: 03/29/22 at 11am. Please call to reschedule if you are not able to make this appointment. Contact information: [address] [place] [phone] on 03/02/2022.   Why: Schedule is Monday/Wednesday/Friday with 11:30 chair time.  Please arrive at 11:00 am if possible. Contact information: [phone]                Discharge Exam: Filed Weights   03/01/22 1220 03/03/22 1700 03/03/22 1701  Weight: 85.4 kg 89 kg 87.1 kg    General.  Chronically ill-appearing, pleasant, in no acute distress. Pulmonary.  Lungs clear bilaterally, normal respiratory effort. CV.  Regular rate and rhythm, no JVD, rub or murmur. Abdomen.  Soft, nontender, nondistended, BS positive. CNS.  Alert and oriented .  No focal neurologic deficit. Extremities.  No edema, no cyanosis, pulses intact and symmetrical. Psychiatry.  Judgment and insight appears normal.   Condition at discharge: stable  The results of significant diagnostics from this hospitalization (including imaging, microbiology, ancillary and laboratory) are listed below for reference.   Imaging Studies: MR CERVICAL SPINE WO CONTRAST  Result Date: 02/23/2022 CLINICAL DATA:  Rule out discitis osteomyelitis.  MSSA bacteremia. EXAM: MRI CERVICAL SPINE WITHOUT CONTRAST TECHNIQUE: Multiplanar, multisequence MR imaging of the  cervical spine was performed. No intravenous contrast was administered. COMPARISON:  MRI thoracic spine 02/22/2022 FINDINGS: Alignment: Image quality degraded by motion Mild retrolisthesis C5-6. Vertebrae: Hemangioma T1 vertebral body.  Negative for fracture Prominent fluid in the disc space at C6-7 with mild edema in the adjacent endplates. Mild prevertebral fluid also present. Cord: Cord evaluation limited given the degree of motion. No cord compression or cord lesion identified. Posterior Fossa, vertebral arteries, paraspinal tissues: Prevertebral edema is present. No paraspinous fluid collection. No mass or adenopathy Disc levels: C2-3: Mild disc and facet degeneration. Mild foraminal narrowing bilaterally. C3-4: Disc and facet degeneration. Mild central canal stenosis and mild foraminal narrowing bilaterally due to spurring C4-5: Disc and facet degeneration. Mild central canal stenosis and mild foraminal narrowing bilaterally due to spurring C5-6: Disc and facet degeneration. Mild spinal stenosis and mild foraminal narrowing bilaterally due to spurring C6-7:  Large amount of fluid in the disc space with edema in the adjacent endplates. Fluid appears contiguous with prevertebral fluid. Findings are suspicious for discitis and osteomyelitis. No epidural abscess. Right-sided disc and osteophyte complex flattening the cord. There is moderate right foraminal narrowing and mild left foraminal narrowing. Mild to moderate central canal stenosis. C7-T1: Negative for stenosis. IMPRESSION: 1. Image quality degraded by motion. 2. Large amount of fluid in the disc space at C6-7 with edema in the adjacent endplates and mild prevertebral fluid. Findings are suspicious for discitis and osteomyelitis. No epidural abscess. Right-sided disc and osteophyte complex causing cord deformity and moderate right foraminal narrowing. Mild to moderate spinal stenosis. 3. 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 Electronically Signed   By: Franchot Gallo M.D.   On: 02/23/2022 11:26   MR LUMBAR SPINE WO CONTRAST  Result Date: 02/23/2022 CLINICAL DATA:  Initial evaluation for staph bacteremia. EXAM: MRI LUMBAR SPINE WITHOUT CONTRAST TECHNIQUE: Multiplanar, multisequence MR imaging of the lumbar spine was performed. No intravenous contrast was administered. COMPARISON:  Prior CT from 02/21/2022. FINDINGS: Segmentation: Examination is severely degraded by motion artifact, markedly limiting assessment. Standard segmentation. Alignment: Physiologic with preservation of the normal lumbar lordosis. No significant listhesis. Vertebrae: Vertebral body height grossly maintained with no acute or chronic fracture. Bone marrow signal intensity diffusely heterogeneous. No worrisome osseous lesions. Fluid signal intensity with adjacent mild reactive endplate changes noted at the T12-L1, L1-2, L2-3, L3-4, and L4-5 interspaces. While these findings are in large part suspected to be degenerative, possible infection with osteomyelitis discitis is difficult to  exclude on this severely motion degraded exam. Question of mild paraspinous edema within the adjacent psoas musculature. Conus medullaris and cauda equina: Conus extends to the L1 level. Conus and cauda equina are grossly within normal limits. No visible epidural collections, although evaluation is fairly limited. Paraspinal and other soft tissues: Question mild paraspinous edema within the psoas musculature bilaterally. No visible collections. Disc levels: T12-L1: Disc bulge with endplate spurring. Left greater than right facet hypertrophy. No significant spinal stenosis. Foramina appear grossly patent. L1-2: Intervertebral disc space narrowing with diffuse disc bulge and reactive endplate change. Moderate facet hypertrophy. At least moderate spinal stenosis. Foramina appear grossly patent. L2-3: Degenerative intervertebral disc space narrowing with diffuse disc bulge and reactive endplate change. Moderate facet hypertrophy. Resultant severe spinal stenosis. Moderate right L2 foraminal narrowing. Left neural foramen remains patent. L3-4: Degenerative intervertebral disc space narrowing with diffuse disc bulge and endplate spurring. Moderate facet arthrosis. Resultant severe spinal stenosis. Foramina appear grossly patent. L4-5: Degenerative intervertebral disc space narrowing  with diffuse disc bulge and reactive endplate spurring. Moderate facet hypertrophy. Moderate canal with severe left and moderate right lateral recess stenosis. Mild to moderate left L4 foraminal narrowing. Right neural foramen remains patent. L5-S1: Advanced degenerative intervertebral disc space narrowing with diffuse disc bulge and reactive endplate spurring. Moderate facet hypertrophy. Moderate left greater than right lateral recess stenosis. Central canal remains patent. No significant foraminal stenosis. IMPRESSION: 1. Severely motion degraded exam, markedly limiting assessment. 2. Fluid signal intensity with adjacent mild reactive  endplate changes at the T12-L1, L1-2, L2-3, L3-4, and L4-5 interspaces. While these findings could be degenerative in nature, possible infection with osteomyelitis discitis is difficult to exclude on this severely motion degraded exam. Correlation with symptomatology and laboratory values recommended. 3. 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 Electronically Signed   By: Jeannine Boga M.D.   On: 02/23/2022 01:29   MR THORACIC SPINE WO CONTRAST  Result Date: 02/23/2022 CLINICAL DATA:  Initial evaluation for staph bacteremia. EXAM: MRI THORACIC SPINE WITHOUT CONTRAST TECHNIQUE: Multiplanar, multisequence MR imaging of the thoracic spine was performed. No intravenous contrast was administered. COMPARISON:  Prior CT from 02/21/2022. FINDINGS: Alignment: Examination is severely degraded by motion artifact, markedly limiting assessment. Mild exaggeration of the normal thoracic kyphosis.  No listhesis. Vertebrae: Vertebral body height grossly maintained without acute or chronic fracture. Bone marrow signal intensity diffusely heterogeneous. 1 cm T1/T2 hyperintense lesion within the T1 vertebral body, likely a hemangioma. No definite worrisome osseous lesions. Fluid signal intensity with mild disc space widening and reactive endplate edema noted about the partially visualized C6-7 interspace (series 6, image 10). Suggestion of mild paraspinous edema within this region. While these findings could be degenerative, possible osteomyelitis discitis could also have this appearance. Otherwise, no other convincing evidence for acute infection elsewhere within the thoracic spine on this motion degraded exam. Cord: Grossly normal signal and morphology. No visible epidural collections. Paraspinal and other soft tissues: Probable mild paraspinous edema adjacent to the C6-7 interspace. No other visible paraspinous soft  tissue abnormality. Layering bilateral pleural effusions with associated atelectasis and/or consolidation. Disc levels: Multilevel disc desiccation with mild noncompressive disc bulging and reactive endplate spurring seen throughout the thoracic spine. No significant or high-grade spinal stenosis. Foramina appear grossly patent. IMPRESSION: 1. Severely motion degraded exam. 2. Fluid signal intensity with reactive endplate in paraspinous edema about the partially visualized C6-7 interspace. While these findings could be degenerative in nature, possible osteomyelitis discitis could also have this appearance. Correlation with symptomatology and laboratory values recommended. Further evaluation with dedicated MRI of the cervical spine could be performed for further evaluation as the patient is able to tolerate. 3. No other convincing evidence for acute infection within the thoracic spine. 4. Layering bilateral pleural effusions with associated atelectasis and/or consolidation. Electronically Signed   By: Jeannine Boga M.D.   On: 02/23/2022 01:20   ECHOCARDIOGRAM COMPLETE  Result Date: 02/22/2022    ECHOCARDIOGRAM REPORT   Patient Name:   LAURI TILL Date of Exam: 02/22/2022 Medical Rec #:  [id number]        Height:       72.0 in Accession #:    [id number]       Weight:       171.0 lb Date of Birth:  [birth year]        BSA:          1.993 m Patient Age:    45 years  BP:           110/43 mmHg Patient Gender: M                HR:           124 bpm. Exam Location:  Inpatient Procedure: 2D Echo, Cardiac Doppler and Color Doppler Indications:    elevated trops  History:        Patient has prior history of Echocardiogram examinations, most                 recent 01/17/2021. CAD, Arrythmias:Bradycardia; Risk                 Factors:Hypertension and Dyslipidemia.  Sonographer:    Melissa Morford RDCS (AE, PE) Referring Phys: [place]  1. Left ventricular ejection fraction, by  estimation, is 60 to 65%. The left ventricle has normal function. The left ventricle has no regional wall motion abnormalities. Left ventricular diastolic parameters are consistent with Grade I diastolic dysfunction (impaired relaxation). Elevated left ventricular end-diastolic pressure. The E/e' is 73.  2. Right ventricular systolic function is normal. The right ventricular size is normal. There is normal pulmonary artery systolic pressure. The estimated right ventricular systolic pressure is 44.9 mmHg.  3. Left atrial size was moderately dilated.  4. The mitral valve is abnormal. Mild mitral valve regurgitation. Moderate mitral annular calcification.  5. The aortic valve is tricuspid. Aortic valve regurgitation is not visualized. Mild aortic valve stenosis. Aortic valve area, by VTI measures 2.45 cm. Aortic valve mean gradient measures 12.5 mmHg. Aortic valve Vmax measures 2.18 m/s.  6. Aortic dilatation noted. There is borderline dilatation of the ascending aorta, measuring 39 mm.  7. The inferior vena cava is dilated in size with <50% respiratory variability, suggesting right atrial pressure of 15 mmHg.  8. Cannot exclude a small PFO. Comparison(s): Changes from prior study are noted. 01/17/2021: LVEF 60-65%, mild AS - mean gradient 12 mmHg. FINDINGS  Left Ventricle: Left ventricular ejection fraction, by estimation, is 60 to 65%. The left ventricle has normal function. The left ventricle has no regional wall motion abnormalities. The left ventricular internal cavity size was normal in size. There is  no left ventricular hypertrophy. Left ventricular diastolic parameters are consistent with Grade I diastolic dysfunction (impaired relaxation). Elevated left ventricular end-diastolic pressure. The E/e' is 15. Right Ventricle: The right ventricular size is normal. No increase in right ventricular wall thickness. Right ventricular systolic function is normal. There is normal pulmonary artery systolic pressure. The  tricuspid regurgitant velocity is 1.63 m/s, and  with an assumed right atrial pressure of 15 mmHg, the estimated right ventricular systolic pressure is 67.5 mmHg. Left Atrium: Left atrial size was moderately dilated. Right Atrium: Right atrial size was normal in size. Pericardium: There is no evidence of pericardial effusion. Mitral Valve: The mitral valve is abnormal. There is moderate calcification of the posterior mitral valve leaflet(s). Moderate mitral annular calcification. Mild mitral valve regurgitation. Tricuspid Valve: The tricuspid valve is grossly normal. Tricuspid valve regurgitation is trivial. Aortic Valve: The aortic valve is tricuspid. Aortic valve regurgitation is not visualized. Mild aortic stenosis is present. Aortic valve mean gradient measures 12.5 mmHg. Aortic valve peak gradient measures 19.0 mmHg. Aortic valve area, by VTI measures 2.45 cm. Pulmonic Valve: The pulmonic valve was grossly normal. Pulmonic valve regurgitation is trivial. Aorta: Aortic dilatation noted. There is borderline dilatation of the ascending aorta, measuring 39 mm. Venous: The inferior vena cava is dilated in  size with less than 50% respiratory variability, suggesting right atrial pressure of 15 mmHg. IAS/Shunts: Cannot exclude a small PFO.  LEFT VENTRICLE PLAX 2D LVIDd:         5.33 cm      Diastology LVIDs:         4.13 cm      LV e' medial:    8.92 cm/s LV PW:         0.93 cm      LV E/e' medial:  13.9 LV IVS:        0.87 cm      LV e' lateral:   6.64 cm/s LVOT diam:     2.10 cm      LV E/e' lateral: 18.7 LV SV:         108 LV SV Index:   54 LVOT Area:     3.46 cm  LV Volumes (MOD) LV vol d, MOD A2C: 143.0 ml LV vol d, MOD A4C: 162.0 ml LV vol s, MOD A2C: 70.6 ml LV vol s, MOD A4C: 65.8 ml LV SV MOD A2C:     72.4 ml LV SV MOD A4C:     162.0 ml LV SV MOD BP:      88.1 ml RIGHT VENTRICLE RV S prime:     11.30 cm/s TAPSE (M-mode): 2.6 cm LEFT ATRIUM             Index        RIGHT ATRIUM           Index LA diam:         4.20 cm 2.11 cm/m   RA Area:     14.50 cm LA Vol (A2C):   77.0 ml 38.63 ml/m  RA Volume:   34.20 ml  17.16 ml/m LA Vol (A4C):   87.3 ml 43.80 ml/m LA Biplane Vol: 87.7 ml 44.00 ml/m  AORTIC VALVE AV Area (Vmax):    2.38 cm AV Area (Vmean):   2.31 cm AV Area (VTI):     2.45 cm AV Vmax:           218.00 cm/s AV Vmean:          167.000 cm/s AV VTI:            0.440 m AV Peak Grad:      19.0 mmHg AV Mean Grad:      12.5 mmHg LVOT Vmax:         150.00 cm/s LVOT Vmean:        111.500 cm/s LVOT VTI:          0.310 m LVOT/AV VTI ratio: 0.71  AORTA Ao Root diam: 3.60 cm Ao Asc diam:  3.90 cm MITRAL VALVE                TRICUSPID VALVE MV Area (PHT): 4.80 cm     TR Peak grad:   10.6 mmHg MV Decel Time: 158 msec     TR Vmax:        163.00 cm/s MV E velocity: 124.00 cm/s MV A velocity: 121.00 cm/s  SHUNTS MV E/A ratio:  1.02         Systemic VTI:  0.31 m                             Systemic Diam: 2.10 cm Lyman Bishop MD Electronically signed by Lyman Bishop MD Signature Date/Time: 02/22/2022/11:01:26 AM  Final    DG Foot 2 Views Left  Result Date: 02/22/2022 CLINICAL DATA:  Cellulitis EXAM: LEFT FOOT - 2 VIEW COMPARISON:  Radiographs 11/02/2021; MRI 06/08/2021; radiographs 06/07/2021 FINDINGS: 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 Osseous destruction at the base of the second proximal phalanx in the head of the second metatarsal consistent with osteomyelitis. Question osteo myelitis of the medial aspect of the fourth metatarsal head versus projection artifact. Remote posttraumatic deformity about the head of the third metatarsal. 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 Postsurgical changes about the ankle. 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 Question osteomyelitis of the medial aspect of the fourth metatarsal head. Electronically Signed   By: Placido Sou M.D.   On: 02/22/2022 02:04   DG Chest 1 View  Result Date: 02/21/2022 CLINICAL DATA:   Weakness EXAM: CHEST  1 VIEW COMPARISON:  02/20/2022 FINDINGS: Stable cardiomediastinal contours. Slightly low lung volumes with bibasilar atelectasis. Trace effusions. No pneumothorax. Advanced degenerative changes of the left glenohumeral joint. IMPRESSION: Bibasilar atelectasis and trace bilateral pleural effusions. Electronically Signed   By: Davina Poke D.O.   On: 02/21/2022 20:42   CT CHEST ABDOMEN PELVIS WO CONTRAST  Result Date: 02/21/2022 CLINICAL DATA:  81 year old male with history of unintended weight loss and fever. EXAM: CT CHEST, ABDOMEN AND PELVIS WITHOUT CONTRAST TECHNIQUE: Multidetector CT imaging of the chest, abdomen and pelvis was performed following the standard protocol without IV contrast. RADIATION DOSE REDUCTION: This exam was performed according to the departmental dose-optimization program which includes automated exposure control, adjustment of the mA and/or kV according to patient size and/or use of iterative reconstruction technique. COMPARISON:  CT of the abdomen and pelvis 11/19/2021. No prior chest CT. FINDINGS: CT CHEST FINDINGS Cardiovascular: Heart size is normal. There is no significant pericardial fluid, thickening or pericardial calcification. There is aortic atherosclerosis, as well as atherosclerosis of the great vessels of the mediastinum and the coronary arteries, including calcified atherosclerotic plaque in the left main, left anterior descending, left circumflex and right coronary arteries. Mild calcifications of the mitral annulus. Moderate calcifications of the aortic valve. Mediastinum/Nodes: No pathologically enlarged mediastinal or hilar lymph nodes. Please note that accurate exclusion of hilar adenopathy is limited on noncontrast CT scans. Esophagus is unremarkable in appearance. No axillary lymphadenopathy. Lungs/Pleura: Trace bilateral pleural effusions lying dependently with minimal passive subsegmental atelectasis in the lower lobes of the lungs  bilaterally. There is some very mild patchy areas of ground-glass attenuation in the right upper lobe, nonspecific, but potentially of infectious or inflammatory etiology. No confluent consolidative airspace disease. No definite suspicious appearing pulmonary nodules or masses are noted. Musculoskeletal: Multiple old healed right-sided rib fractures are incidentally noted. There are no aggressive appearing lytic or blastic lesions noted in the visualized portions of the skeleton. CT ABDOMEN PELVIS FINDINGS Hepatobiliary: Liver has a shrunken appearance and nodular contour, indicative of advanced cirrhosis. No discrete cystic or solid hepatic lesions are confidently identified on today's noncontrast CT examination. Multiple tiny calcified gallstones lie dependently in the gallbladder. Gallbladder is moderately distended. Gallbladder wall does not appear thickened or edematous. No definite pericholecystic fluid. Pancreas: No definite pancreatic mass or peripancreatic fluid collections or inflammatory changes are noted on today's noncontrast CT examination. Spleen: Spleen appears mildly enlarged measuring 11.5 x 7.7 x 12.1 cm (estimated splenic volume of 536 mL) . Adrenals/Urinary Tract: Mild bilateral renal atrophy. In the lower pole of the left kidney there is a partially exophytic low-attenuation 1.7  cm lesion (axial image 78 of series 3) which is incompletely characterized on today's noncontrast CT examination, but similar to prior studies and statistically likely a small cysts (no imaging follow-up is recommended). Unenhanced appearance of the right kidney and bilateral adrenal glands is otherwise unremarkable. No hydroureteronephrosis. Urinary bladder is largely obscured by beam hardening artifact from the patient's bilateral hip arthroplasties. Stomach/Bowel: Unenhanced appearance of the stomach is normal. There is no pathologic dilatation of small bowel or colon. A few scattered colonic diverticuli are noted,  without definite focal surrounding inflammatory changes to indicate an acute diverticulitis at this time. Normal appendix. Vascular/Lymphatic: Atherosclerotic calcifications throughout the abdominal aorta and pelvic vasculature. Portal vein appears mildly dilated measuring 17 mm in the porta hepatis. No lymphadenopathy noted in the abdomen or pelvis. Reproductive: Prostate gland and seminal vesicles are obscured by beam hardening artifact. Other: No significant volume of ascites.  No pneumoperitoneum. Musculoskeletal: There are no aggressive appearing lytic or blastic lesions noted in the visualized portions of the skeleton. 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 IMPRESSION: 1. No definite findings in the chest, abdomen or pelvis to account for the patient's unexplained weight loss. There are very subtle areas of mild ground-glass attenuation in the right upper lobe which are nonspecific, but potentially of infectious or inflammatory etiology. 2. Trace bilateral pleural effusions lying dependently. 3. Cholelithiasis without evidence of acute cholecystitis. 4. 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 5. Colonic diverticulosis without evidence of acute diverticulitis at this time. 6. Aortic atherosclerosis, in addition to left main and three-vessel coronary artery disease. 7. There are calcifications of the aortic valve and mitral annulus. Echocardiographic correlation for evaluation of potential valvular dysfunction may be warranted if clinically indicated. 8. Additional incidental findings, as above. Electronically Signed   By: Vinnie Langton M.D.   On: 02/21/2022 06:26   DG Chest 2 View  Result Date: 02/20/2022 CLINICAL DATA:  Fall. EXAM: CHEST - 2 VIEW COMPARISON:  Chest x-ray 01/25/2022 FINDINGS: The heart size and mediastinal contours are within normal limits. There is minimal  atelectasis in the lung bases. Lung volumes are low. Visualized skeletal structures are unremarkable. IMPRESSION: No active cardiopulmonary disease. Electronically Signed   By: Ronney Asters M.D.   On: 02/20/2022 23:07   DG Pelvis Portable  Result Date: 02/20/2022 CLINICAL DATA:  Fall. EXAM: PORTABLE PELVIS 1-2 VIEWS COMPARISON:  06/05/2021. FINDINGS: There is no evidence of pelvic fracture or diastasis. Bilateral hip arthroplasty changes are noted. There is no evidence of hardware loosening. Degenerative changes are noted in the lower lumbar spine. IMPRESSION: No acute fracture or dislocation. Electronically Signed   By: Brett Fairy M.D.   On: 02/20/2022 23:05   CT Head Wo Contrast  Result Date: 02/20/2022 CLINICAL DATA:  Fall EXAM: CT HEAD WITHOUT CONTRAST CT CERVICAL SPINE WITHOUT CONTRAST TECHNIQUE: Multidetector CT imaging of the head and cervical spine was performed following the standard protocol without intravenous contrast. Multiplanar CT image reconstructions of the cervical spine were also generated. RADIATION DOSE REDUCTION: This exam was performed according to the departmental dose-optimization program which includes automated exposure control, adjustment of the mA and/or kV according to patient size and/or use of iterative reconstruction technique. COMPARISON:  01/31/2022 FINDINGS: Motion degraded images. CT HEAD FINDINGS Brain: No evidence of acute infarction, hemorrhage, hydrocephalus, extra-axial collection or mass lesion/mass effect. Global cortical and central atrophy. Subcortical white matter and periventricular small vessel ischemic changes. Vascular: Intracranial  atherosclerosis. Skull: Normal. Negative for fracture or focal lesion. Sinuses/Orbits: The visualized paranasal sinuses are essentially clear. The mastoid air cells are unopacified. Other: None. CT CERVICAL SPINE FINDINGS Alignment: Straightening the cervical spine, likely positional. Skull base and vertebrae: Stable  fracture involving the posterior aspect of the right superior articular facet of C1 (series 3/image 13). No new/acute fracture. Soft tissues and spinal canal: No prevertebral fluid or swelling. No visible canal hematoma. Disc levels: Moderate degenerative changes of the lower cervical spine. Spinal canal is patent. Upper chest: Visualized lung apices are essentially clear. Other: Visualized thyroid is grossly unremarkable. IMPRESSION: No acute intracranial abnormality. Atrophy with small vessel ischemic changes. Stable fracture involving the posterior aspect of the right superior articular facet of C1. No new/acute traumatic injury to the cervical spine. Electronically Signed   By: Julian Hy M.D.   On: 02/20/2022 22:48   CT Cervical Spine Wo Contrast  Result Date: 02/20/2022 CLINICAL DATA:  Fall EXAM: CT HEAD WITHOUT CONTRAST CT CERVICAL SPINE WITHOUT CONTRAST TECHNIQUE: Multidetector CT imaging of the head and cervical spine was performed following the standard protocol without intravenous contrast. Multiplanar CT image reconstructions of the cervical spine were also generated. RADIATION DOSE REDUCTION: This exam was performed according to the departmental dose-optimization program which includes automated exposure control, adjustment of the mA and/or kV according to patient size and/or use of iterative reconstruction technique. COMPARISON:  01/31/2022 FINDINGS: Motion degraded images. CT HEAD FINDINGS Brain: No evidence of acute infarction, hemorrhage, hydrocephalus, extra-axial collection or mass lesion/mass effect. Global cortical and central atrophy. Subcortical white matter and periventricular small vessel ischemic changes. Vascular: Intracranial atherosclerosis. Skull: Normal. Negative for fracture or focal lesion. Sinuses/Orbits: The visualized paranasal sinuses are essentially clear. The mastoid air cells are unopacified. Other: None. CT CERVICAL SPINE FINDINGS Alignment: Straightening the  cervical spine, likely positional. Skull base and vertebrae: Stable fracture involving the posterior aspect of the right superior articular facet of C1 (series 3/image 13). No new/acute fracture. Soft tissues and spinal canal: No prevertebral fluid or swelling. No visible canal hematoma. Disc levels: Moderate degenerative changes of the lower cervical spine. Spinal canal is patent. Upper chest: Visualized lung apices are essentially clear. Other: Visualized thyroid is grossly unremarkable. IMPRESSION: No acute intracranial abnormality. Atrophy with small vessel ischemic changes. Stable fracture involving the posterior aspect of the right superior articular facet of C1. No new/acute traumatic injury to the cervical spine. Electronically Signed   By: Julian Hy M.D.   On: 02/20/2022 22:48    Microbiology: Results for orders placed or performed during the hospital encounter of 02/21/22  Blood Culture (routine x 2)     Status: Abnormal   Collection Time: 02/21/22  8:42 PM   Specimen: BLOOD RIGHT FOREARM  Result Value Ref Range Status   Specimen Description BLOOD RIGHT FOREARM  Final   Special Requests   Final    BOTTLES DRAWN AEROBIC AND ANAEROBIC Blood Culture adequate volume   Culture  Setup Time   Final    GRAM POSITIVE COCCI IN CLUSTERS IN BOTH AEROBIC AND ANAEROBIC BOTTLES CRITICAL VALUE NOTED.  VALUE IS CONSISTENT WITH PREVIOUSLY REPORTED AND CALLED VALUE.    Culture (A)  Final    STAPHYLOCOCCUS AUREUS SUSCEPTIBILITIES PERFORMED ON PREVIOUS CULTURE WITHIN THE LAST 5 DAYS. Performed at [facility], [place] [address] [place], [place] [address]    Report Status 02/24/2022 FINAL  Final  Blood Culture (routine x 2)     Status: Abnormal   Collection  Time: 02/21/22  8:49 PM   Specimen: BLOOD  Result Value Ref Range Status   Specimen Description BLOOD RIGHT ANTECUBITAL  Final   Special Requests   Final    BOTTLES DRAWN AEROBIC AND ANAEROBIC Blood Culture adequate volume   Culture   Setup Time   Final    GRAM POSITIVE COCCI IN CLUSTERS IN BOTH AEROBIC AND ANAEROBIC BOTTLES CRITICAL RESULT CALLED TO, READ BACK BY AND VERIFIED WITH: PHARMD E [place] [id number] AT 1234 BY CM    Culture (A)  Final    STAPHYLOCOCCUS AUREUS SUSCEPTIBILITIES PERFORMED ON PREVIOUS CULTURE WITHIN THE LAST 5 DAYS. Performed at [facility], [place] [address] [place], [place] [address]    Report Status 02/24/2022 FINAL  Final  Urine Culture     Status: Abnormal   Collection Time: 02/22/22  3:28 AM   Specimen: Urine, Clean Catch  Result Value Ref Range Status   Specimen Description URINE, CLEAN CATCH  Final   Special Requests NONE  Final   Culture (A)  Final    <10,000 COLONIES/mL INSIGNIFICANT GROWTH Performed at [facility], [place] [address] [place], [place] [address]    Report Status 02/23/2022 FINAL  Final  Resp Panel by RT-PCR (Flu A&B, Covid) Anterior Nasal Swab     Status: None   Collection Time: 02/22/22  3:28 AM   Specimen: Anterior Nasal Swab  Result Value Ref Range Status   SARS Coronavirus 2 by RT PCR NEGATIVE NEGATIVE Final    Comment: (NOTE) SARS-CoV-2 target nucleic acids are NOT DETECTED.  The SARS-CoV-2 RNA is generally detectable in upper respiratory specimens during the acute phase of infection. The lowest concentration of SARS-CoV-2 viral copies this assay can detect is 138 copies/mL. A negative result does not preclude SARS-Cov-2 infection and should not be used as the sole basis for treatment or other patient management decisions. A negative result may occur with  improper specimen collection/handling, submission of specimen other than nasopharyngeal swab, presence of viral mutation(s) within the areas targeted by this assay, and inadequate number of viral copies(<138 copies/mL). A negative result must be combined with clinical observations, patient history, and epidemiological information. The expected result is Negative.  Fact Sheet for Patients:   EntrepreneurPulse.com.au  Fact Sheet for Healthcare Providers:  IncredibleEmployment.be  This test is no t yet approved or cleared by the [place] FDA and  has been authorized for detection and/or diagnosis of SARS-CoV-2 by FDA under an Emergency Use Authorization (EUA). This EUA will remain  in effect (meaning this test can be used) for the duration of the COVID-19 declaration under Section 564(b)(1) of the Act, 21 U.S.C.section 360bbb-3(b)(1), unless the authorization is terminated  or revoked sooner.       Influenza A by PCR NEGATIVE NEGATIVE Final   Influenza B by PCR NEGATIVE NEGATIVE Final    Comment: (NOTE) The Xpert Xpress SARS-CoV-2/FLU/RSV plus assay is intended as an aid in the diagnosis of influenza from Nasopharyngeal swab specimens and should not be used as a sole basis for treatment. Nasal washings and aspirates are unacceptable for Xpert Xpress SARS-CoV-2/FLU/RSV testing.  Fact Sheet for Patients: EntrepreneurPulse.com.au  Fact Sheet for Healthcare Providers: IncredibleEmployment.be  This test is not yet approved or cleared by the [place] FDA and has been authorized for detection and/or diagnosis of SARS-CoV-2 by FDA under an Emergency Use Authorization (EUA). This EUA will remain in effect (meaning this test can be used) for the duration of the COVID-19 declaration under  Section 564(b)(1) of the Act, 21 U.S.C. section 360bbb-3(b)(1), unless the authorization is terminated or revoked.  Performed at [facility], [place] [address] [place], [place] [address]   Respiratory (~20 pathogens) panel by PCR     Status: None   Collection Time: 02/22/22  3:28 AM   Specimen: Urine, Clean Catch; Respiratory  Result Value Ref Range Status   Adenovirus NOT DETECTED NOT DETECTED Final   Coronavirus 229E NOT DETECTED NOT DETECTED Final    Comment: (NOTE) The Coronavirus on the  Respiratory Panel, DOES NOT test for the novel  Coronavirus (2019 nCoV)    Coronavirus HKU1 NOT DETECTED NOT DETECTED Final   Coronavirus NL63 NOT DETECTED NOT DETECTED Final   Coronavirus OC43 NOT DETECTED NOT DETECTED Final   Metapneumovirus NOT DETECTED NOT DETECTED Final   Rhinovirus / Enterovirus NOT DETECTED NOT DETECTED Final   Influenza A NOT DETECTED NOT DETECTED Final   Influenza B NOT DETECTED NOT DETECTED Final   Parainfluenza Virus 1 NOT DETECTED NOT DETECTED Final   Parainfluenza Virus 2 NOT DETECTED NOT DETECTED Final   Parainfluenza Virus 3 NOT DETECTED NOT DETECTED Final   Parainfluenza Virus 4 NOT DETECTED NOT DETECTED Final   Respiratory Syncytial Virus NOT DETECTED NOT DETECTED Final   Bordetella pertussis NOT DETECTED NOT DETECTED Final   Bordetella Parapertussis NOT DETECTED NOT DETECTED Final   Chlamydophila pneumoniae NOT DETECTED NOT DETECTED Final   Mycoplasma pneumoniae NOT DETECTED NOT DETECTED Final    Comment: Performed at [facility], [place] [address], [place], [place] [address]  MRSA Next Gen by PCR, Nasal     Status: None   Collection Time: 02/22/22  3:33 AM   Specimen: STOOL; Nasal Swab  Result Value Ref Range Status   MRSA by PCR Next Gen NOT DETECTED NOT DETECTED Final    Comment: (NOTE) The GeneXpert MRSA Assay (FDA approved for NASAL specimens only), is one component of a comprehensive MRSA colonization surveillance program. It is not intended to diagnose MRSA infection nor to guide or monitor treatment for MRSA infections. Test performance is not FDA approved in patients less than 87 years old. Performed at [facility], [place] [address], [place], [place] [address]   Culture, blood (Routine X 2) w Reflex to ID Panel     Status: Abnormal   Collection Time: 02/23/22  2:43 AM   Specimen: BLOOD  Result Value Ref Range Status   Specimen Description BLOOD SITE NOT SPECIFIED  Final   Special Requests   Final    BOTTLES DRAWN AEROBIC AND  ANAEROBIC Blood Culture results may not be optimal due to an inadequate volume of blood received in culture bottles   Culture  Setup Time   Final    GRAM POSITIVE COCCI IN CLUSTERS AEROBIC BOTTLE ONLY CRITICAL RESULT CALLED TO, READ BACK BY AND VERIFIED WITH: PHARMD MICHAEL [place] ON 02/23/22 @ 2027 BY DRT    Culture (A)  Final    STAPHYLOCOCCUS AUREUS SUSCEPTIBILITIES PERFORMED ON PREVIOUS CULTURE WITHIN THE LAST 5 DAYS. Performed at [facility], [place] [address] [place], [place] [address]    Report Status 02/24/2022 FINAL  Final  Culture, blood (Routine X 2) w Reflex to ID Panel     Status: None   Collection Time: 02/23/22  2:43 AM   Specimen: BLOOD  Result Value Ref Range Status   Specimen Description BLOOD SITE NOT SPECIFIED  Final   Special Requests   Final  BOTTLES DRAWN AEROBIC AND ANAEROBIC Blood Culture adequate volume   Culture   Final    NO GROWTH 5 DAYS Performed at [facility], [place] [address] [place], [place] [address]    Report Status 02/28/2022 FINAL  Final  Culture, blood (Routine X 2) w Reflex to ID Panel     Status: None   Collection Time: 02/25/22  8:23 AM   Specimen: BLOOD RIGHT ARM  Result Value Ref Range Status   Specimen Description BLOOD RIGHT ARM  Final   Special Requests   Final    BOTTLES DRAWN AEROBIC AND ANAEROBIC Blood Culture adequate volume   Culture   Final    NO GROWTH 5 DAYS Performed at [facility], [place] [address], [place], [place] [address]    Report Status 03/02/2022 FINAL  Final  Culture, blood (Routine X 2) w Reflex to ID Panel     Status: None   Collection Time: 02/25/22  8:30 AM   Specimen: BLOOD  Result Value Ref Range Status   Specimen Description BLOOD RIGHT ANTECUBITAL  Final   Special Requests   Final    BOTTLES DRAWN AEROBIC AND ANAEROBIC Blood Culture adequate volume   Culture   Final    NO GROWTH 5 DAYS Performed at [facility], [place] [address] [place], [place] [address]    Report Status  03/02/2022 FINAL  Final    Labs: CBC: Recent Labs  Lab 03/01/22 0514  WBC 7.9  HGB 7.7*  HCT 22.9*  MCV 102.2*  PLT 878   Basic Metabolic Panel: Recent Labs  Lab 02/28/22 0425 03/01/22 0514 03/03/22 0438  NA 134* 134* 133*  K 4.1 4.1 4.4  CL 98 98 96*  CO2 '28 25 27  '$ GLUCOSE 100* 99 102*  BUN 31* 39* 32*  CREATININE 2.80* 3.18* 3.02*  CALCIUM 7.9* 7.7* 7.7*  PHOS 3.9 4.2 4.0   Liver Function Tests: Recent Labs  Lab 02/28/22 0425 03/01/22 0514 03/03/22 0438  ALBUMIN 1.8* 1.8* 1.8*   CBG: Recent Labs  Lab 02/27/22 1201 02/27/22 1648  GLUCAP 123* 115*    Discharge time spent: greater than 30 minutes.  This record has been created using Systems analyst. Errors have been sought and corrected,but may not always be located. Such creation errors do not reflect on the standard of care.   Signed: Lorella Nimrod, MD [facility] 03/06/2022

## 2022-03-09 DIAGNOSIS — J90 Pleural effusion, not elsewhere classified: Secondary | ICD-10-CM | POA: Diagnosis not present

## 2022-03-09 DIAGNOSIS — F0390 Unspecified dementia without behavioral disturbance: Secondary | ICD-10-CM | POA: Diagnosis not present

## 2022-03-09 DIAGNOSIS — Z9181 History of falling: Secondary | ICD-10-CM | POA: Diagnosis not present

## 2022-03-09 DIAGNOSIS — E43 Unspecified severe protein-calorie malnutrition: Secondary | ICD-10-CM | POA: Diagnosis not present

## 2022-03-09 DIAGNOSIS — K219 Gastro-esophageal reflux disease without esophagitis: Secondary | ICD-10-CM | POA: Diagnosis not present

## 2022-03-09 DIAGNOSIS — I5032 Chronic diastolic (congestive) heart failure: Secondary | ICD-10-CM | POA: Diagnosis not present

## 2022-03-09 DIAGNOSIS — M4626 Osteomyelitis of vertebra, lumbar region: Secondary | ICD-10-CM | POA: Diagnosis not present

## 2022-03-09 DIAGNOSIS — R7881 Bacteremia: Secondary | ICD-10-CM | POA: Diagnosis not present

## 2022-03-09 DIAGNOSIS — I2510 Atherosclerotic heart disease of native coronary artery without angina pectoris: Secondary | ICD-10-CM | POA: Diagnosis not present

## 2022-03-09 DIAGNOSIS — I480 Paroxysmal atrial fibrillation: Secondary | ICD-10-CM | POA: Diagnosis not present

## 2022-03-09 DIAGNOSIS — B9561 Methicillin susceptible Staphylococcus aureus infection as the cause of diseases classified elsewhere: Secondary | ICD-10-CM | POA: Diagnosis not present

## 2022-03-09 DIAGNOSIS — N186 End stage renal disease: Secondary | ICD-10-CM | POA: Diagnosis not present

## 2022-03-09 DIAGNOSIS — K7030 Alcoholic cirrhosis of liver without ascites: Secondary | ICD-10-CM | POA: Diagnosis not present

## 2022-03-09 DIAGNOSIS — S12000D Unspecified displaced fracture of first cervical vertebra, subsequent encounter for fracture with routine healing: Secondary | ICD-10-CM | POA: Diagnosis not present

## 2022-03-09 DIAGNOSIS — E871 Hypo-osmolality and hyponatremia: Secondary | ICD-10-CM | POA: Diagnosis not present

## 2022-03-09 DIAGNOSIS — M868X7 Other osteomyelitis, ankle and foot: Secondary | ICD-10-CM | POA: Diagnosis not present

## 2022-03-09 DIAGNOSIS — N400 Enlarged prostate without lower urinary tract symptoms: Secondary | ICD-10-CM | POA: Diagnosis not present

## 2022-03-09 DIAGNOSIS — M4622 Osteomyelitis of vertebra, cervical region: Secondary | ICD-10-CM | POA: Diagnosis not present

## 2022-03-09 DIAGNOSIS — I132 Hypertensive heart and chronic kidney disease with heart failure and with stage 5 chronic kidney disease, or end stage renal disease: Secondary | ICD-10-CM | POA: Diagnosis not present

## 2022-03-29 ENCOUNTER — Inpatient Hospital Stay: Payer: Medicare Other | Admitting: Infectious Diseases

## 2022-04-08 DEATH — deceased

## 2023-08-04 IMAGING — MR MR FOOT*L* W/O CM
4 of 5 series · 18 of 40 positions shown · non-contrast
Comparison: Left foot radiograph 06/07/2021

CLINICAL DATA: Osteomyelitis, foot

EXAM:
MRI OF THE LEFT FOOT WITHOUT CONTRAST
TECHNIQUE: Multiplanar, multisequence MR imaging of the left forefoot was
performed. No intravenous contrast was administered.

[Series 5: T1 · coronal · 4.0mm · 0.29mm/px · 3 of 43 slices shown (1 of 2)]
[im 5/43]
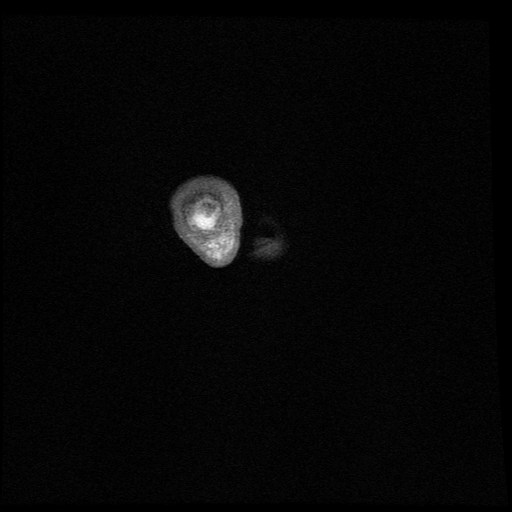
[im 22/43]
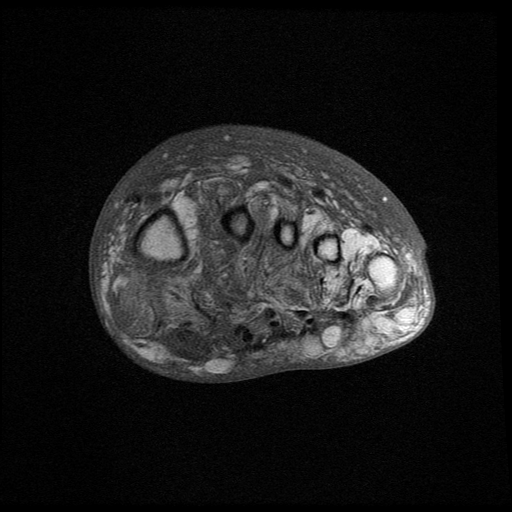
[im 38/43]
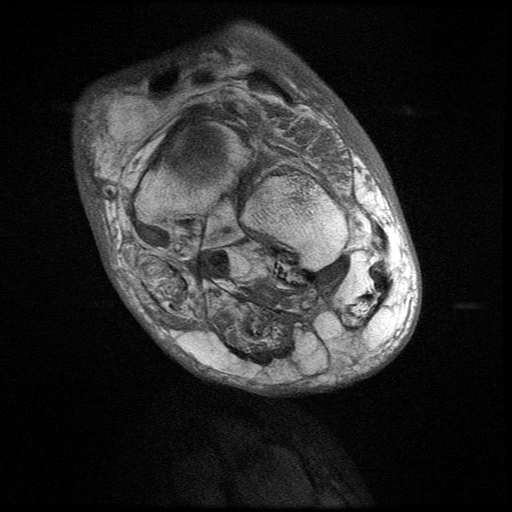

[Series 6: T2 fat-sat · coronal · 4.0mm · 0.29mm/px · 9 of 43 slices shown (1 of 2)]
[im 1/43]
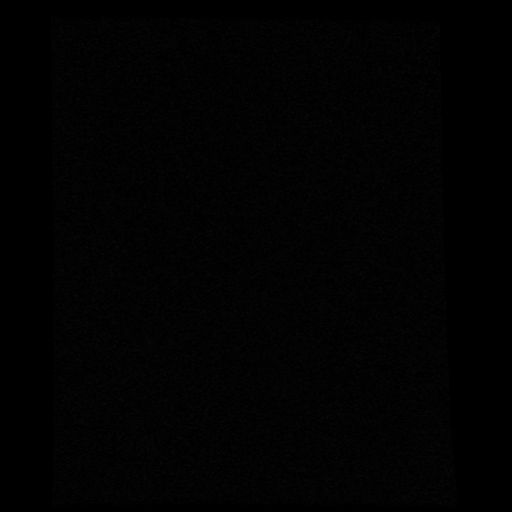
[im 8/43]
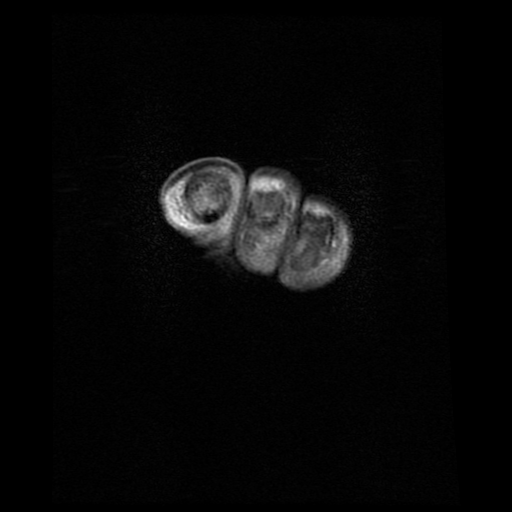
[im 12/43]
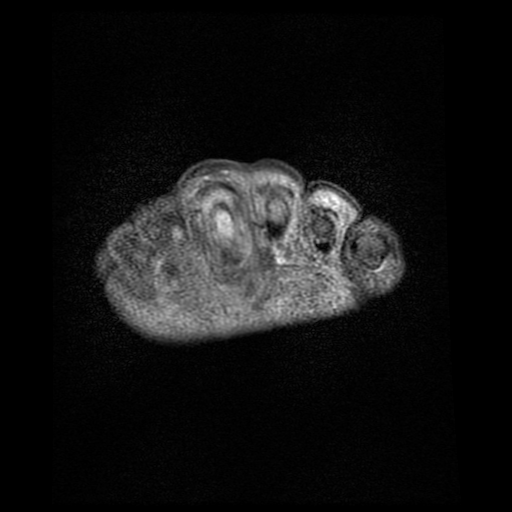
[im 20/43]
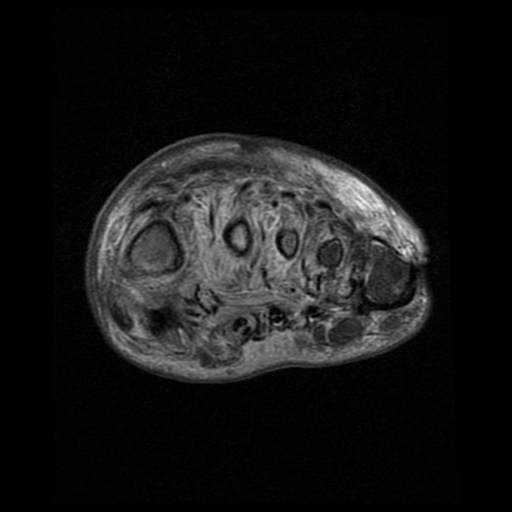
[im 23/43]
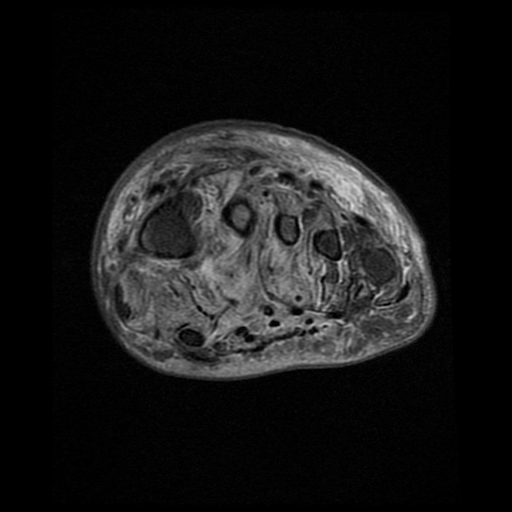
[im 31/43]
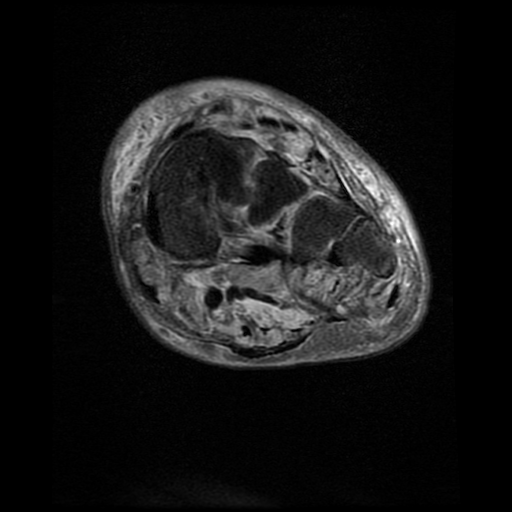
[im 35/43]
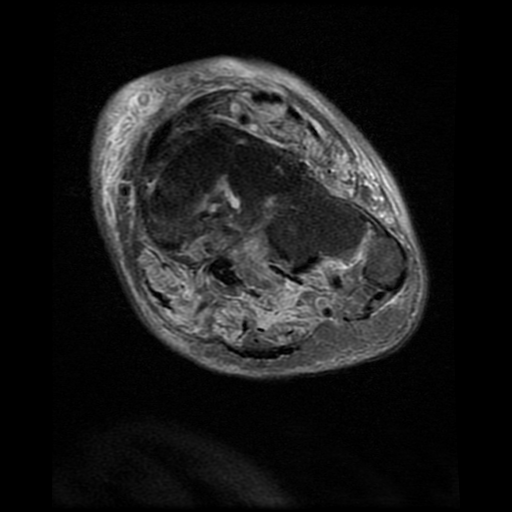
[im 39/43]
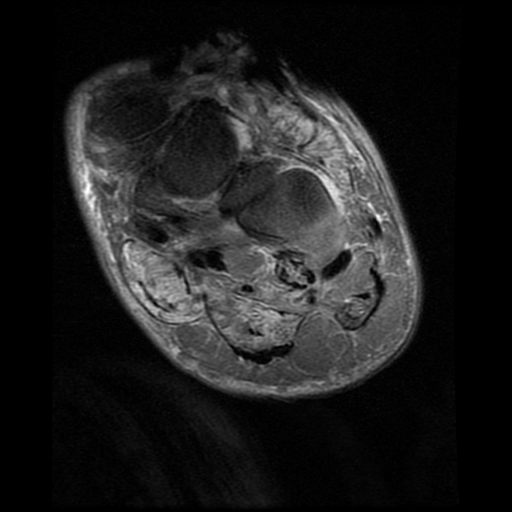
[im 43/43]
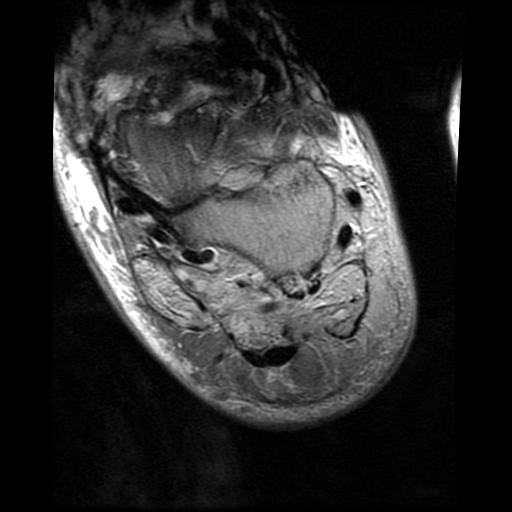

[Series 7: T1 · axial · 4.0mm · 0.33mm/px · z∈[-48,+30]mm · 3 of 18 slices shown (2 of 2)]
[im 1/18]
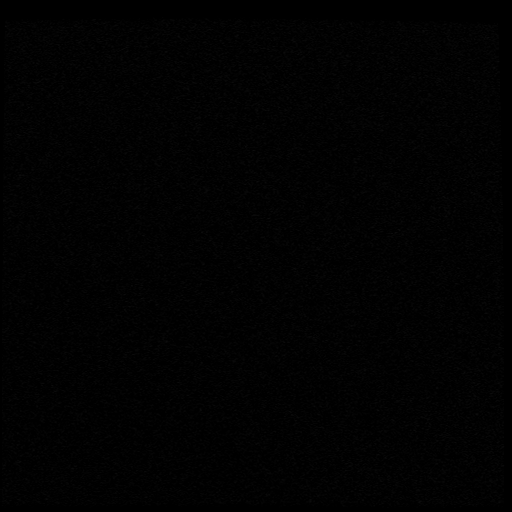
[im 9/18]
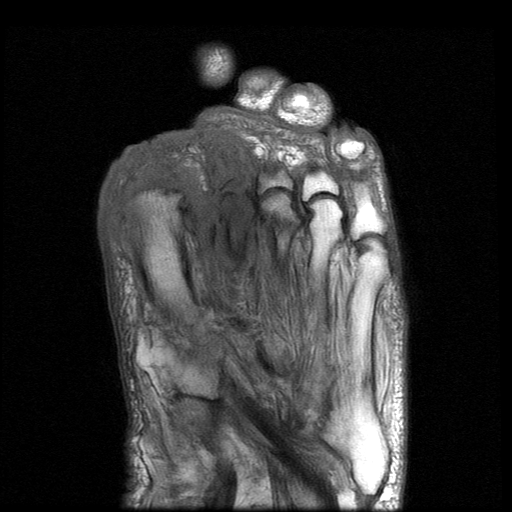
[im 18/18]
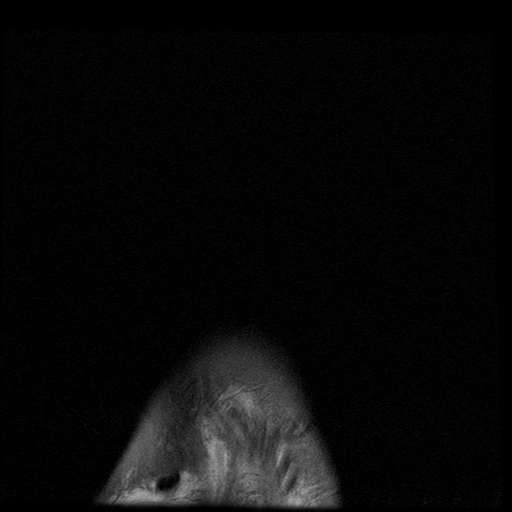

[Series 8: T2 fat-sat · axial · 4.0mm · 0.33mm/px · z∈[-48,+30]mm · 3 of 18 slices shown (2 of 2)]
[im 1/18]
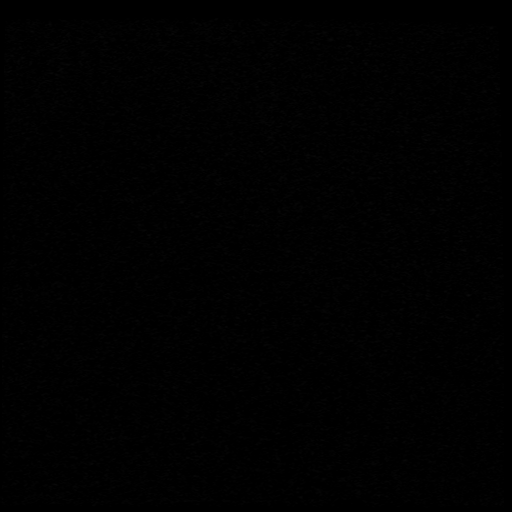
[im 9/18]
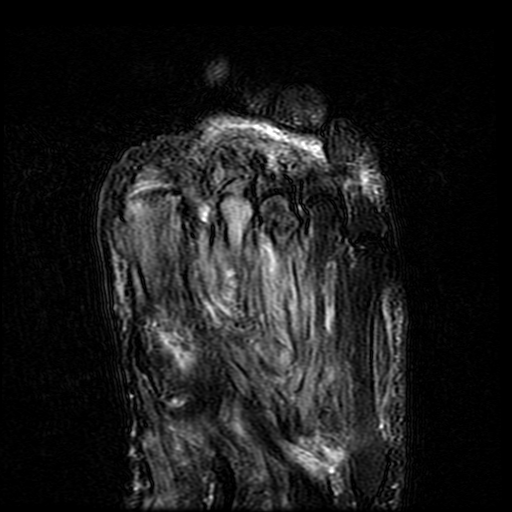
[im 18/18]
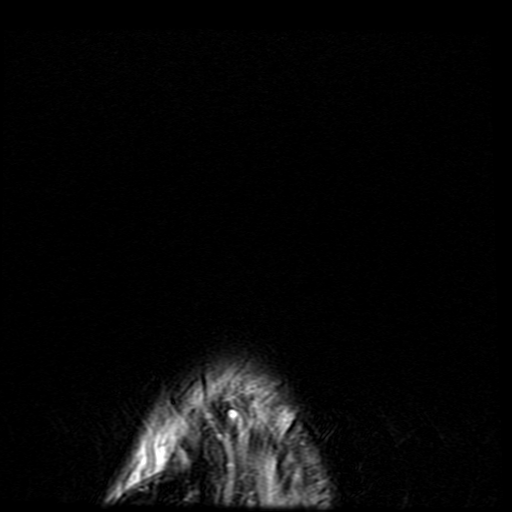

[18 of 40 positions shown; findings below may reference images not displayed]

FINDINGS: Bones/Joint/Cartilage

Prior great toe amputation. There is bony edema and confluent low T1
signal within the second toe proximal phalanx and mid to distal
second metatarsal. The second toe proximal phalanx is dorsally
dislocated from the second metatarsal head.

There is edema signal within the first and third metatarsal heads
with preserved T1 signal. Similar findings but to a lesser degree in
the fourth metatarsal head.

There is a subacute fracture of the third metatarsal head/neck with
mild angulation.

Ligaments

Surgically disrupted first MTP ligaments. Torn second MTP plantar
plate and collateral ligaments. The third through fifth MTP
collateral ligaments appear intact without evidence of plantar plate
tear. The Lisfranc ligament appears intact.

Muscles and Tendons

Diffuse intramuscular edema and muscle atrophy in the foot as is
commonly seen in diabetics.

Soft tissues

Diffuse soft tissue swelling of the foot. There is some localized
fluid in the first webspace between the first and second metatarsal
heads, not well-defined.
IMPRESSION: Osteomyelitis of the second toe proximal phalanx and mid to distal
second metatarsal. Dorsal dislocation of the second MTP joint.

Bony edema in the adjacent first and third metatarsal heads which is
favored to be reactive, though early osteomyelitis could have a
similar appearance.

Subacute third metatarsal head/neck fracture with mild angulation,
accounting for some of the aforementioned bony edema.

Localized fluid in the first webspace between the first and second
metatarsal heads, not well-defined but could represent developing
phlegmon/abscess.

Reactive mild edema in the fourth metatarsal head.
